# Patient Record
Sex: Female | Born: 1945
Health system: Southern US, Community
[De-identification: ages and names within clinical notes are randomized; demographics above are authoritative.]

## PROBLEM LIST (undated history)

## (undated) DIAGNOSIS — I10 Essential (primary) hypertension: Secondary | ICD-10-CM

## (undated) DIAGNOSIS — K219 Gastro-esophageal reflux disease without esophagitis: Secondary | ICD-10-CM

## (undated) DIAGNOSIS — D509 Iron deficiency anemia, unspecified: Secondary | ICD-10-CM

## (undated) DIAGNOSIS — I4891 Unspecified atrial fibrillation: Secondary | ICD-10-CM

## (undated) DIAGNOSIS — M1990 Unspecified osteoarthritis, unspecified site: Secondary | ICD-10-CM

## (undated) DIAGNOSIS — E559 Vitamin D deficiency, unspecified: Secondary | ICD-10-CM

## (undated) DIAGNOSIS — F329 Major depressive disorder, single episode, unspecified: Secondary | ICD-10-CM

## (undated) DIAGNOSIS — E119 Type 2 diabetes mellitus without complications: Secondary | ICD-10-CM

## (undated) DIAGNOSIS — I421 Obstructive hypertrophic cardiomyopathy: Secondary | ICD-10-CM

## (undated) DIAGNOSIS — K589 Irritable bowel syndrome without diarrhea: Secondary | ICD-10-CM

## (undated) DIAGNOSIS — F419 Anxiety disorder, unspecified: Secondary | ICD-10-CM

## (undated) DIAGNOSIS — I639 Cerebral infarction, unspecified: Secondary | ICD-10-CM

## (undated) DIAGNOSIS — I951 Orthostatic hypotension: Secondary | ICD-10-CM

## (undated) DIAGNOSIS — I82409 Acute embolism and thrombosis of unspecified deep veins of unspecified lower extremity: Secondary | ICD-10-CM

## (undated) DIAGNOSIS — M359 Systemic involvement of connective tissue, unspecified: Secondary | ICD-10-CM

## (undated) DIAGNOSIS — F32A Depression, unspecified: Secondary | ICD-10-CM

## (undated) DIAGNOSIS — F209 Schizophrenia, unspecified: Secondary | ICD-10-CM

## (undated) DIAGNOSIS — I509 Heart failure, unspecified: Secondary | ICD-10-CM

## (undated) DIAGNOSIS — Z8673 Personal history of transient ischemic attack (TIA), and cerebral infarction without residual deficits: Secondary | ICD-10-CM

## (undated) DIAGNOSIS — G8190 Hemiplegia, unspecified affecting unspecified side: Secondary | ICD-10-CM

## (undated) DIAGNOSIS — E785 Hyperlipidemia, unspecified: Secondary | ICD-10-CM

## (undated) DIAGNOSIS — Z993 Dependence on wheelchair: Secondary | ICD-10-CM

## (undated) DIAGNOSIS — I959 Hypotension, unspecified: Secondary | ICD-10-CM

## (undated) DIAGNOSIS — I4892 Unspecified atrial flutter: Secondary | ICD-10-CM

## (undated) DIAGNOSIS — U071 COVID-19: Secondary | ICD-10-CM

## (undated) HISTORY — DX: Unspecified atrial fibrillation: I48.91

## (undated) HISTORY — DX: Type 2 diabetes mellitus without complications: E11.9

## (undated) HISTORY — DX: COVID-19: U07.1

## (undated) HISTORY — PX: BACK SURGERY: SHX140

## (undated) HISTORY — DX: Essential (primary) hypertension: I10

## (undated) HISTORY — DX: Acute embolism and thrombosis of unspecified deep veins of unspecified lower extremity: I82.409

## (undated) HISTORY — DX: Personal history of transient ischemic attack (TIA), and cerebral infarction without residual deficits: Z86.73

## (undated) HISTORY — DX: Orthostatic hypotension: I95.1

## (undated) HISTORY — DX: Unspecified atrial flutter: I48.92

---

## 1997-05-18 ENCOUNTER — Other Ambulatory Visit: Admission: RE | Admit: 1997-05-18 | Discharge: 1997-05-18 | Payer: Self-pay | Admitting: Obstetrics and Gynecology

## 1997-06-19 ENCOUNTER — Ambulatory Visit (HOSPITAL_COMMUNITY): Admission: RE | Admit: 1997-06-19 | Discharge: 1997-06-19 | Payer: Self-pay | Admitting: Gastroenterology

## 1999-03-11 ENCOUNTER — Encounter: Admission: RE | Admit: 1999-03-11 | Discharge: 1999-03-11 | Payer: Self-pay | Admitting: Family Medicine

## 1999-03-11 ENCOUNTER — Encounter: Payer: Self-pay | Admitting: Family Medicine

## 1999-04-14 ENCOUNTER — Encounter: Payer: Self-pay | Admitting: Family Medicine

## 1999-04-14 ENCOUNTER — Encounter: Admission: RE | Admit: 1999-04-14 | Discharge: 1999-04-14 | Payer: Self-pay | Admitting: Family Medicine

## 1999-05-26 ENCOUNTER — Other Ambulatory Visit: Admission: RE | Admit: 1999-05-26 | Discharge: 1999-05-26 | Payer: Self-pay | Admitting: Obstetrics and Gynecology

## 1999-06-09 ENCOUNTER — Encounter: Payer: Self-pay | Admitting: Obstetrics and Gynecology

## 1999-06-09 ENCOUNTER — Ambulatory Visit (HOSPITAL_COMMUNITY): Admission: RE | Admit: 1999-06-09 | Discharge: 1999-06-09 | Payer: Self-pay | Admitting: Obstetrics and Gynecology

## 1999-06-20 ENCOUNTER — Other Ambulatory Visit: Admission: RE | Admit: 1999-06-20 | Discharge: 1999-06-20 | Payer: Self-pay | Admitting: Obstetrics and Gynecology

## 1999-06-20 ENCOUNTER — Encounter (INDEPENDENT_AMBULATORY_CARE_PROVIDER_SITE_OTHER): Payer: Self-pay | Admitting: Specialist

## 1999-07-12 ENCOUNTER — Encounter: Payer: Self-pay | Admitting: Obstetrics and Gynecology

## 1999-07-13 ENCOUNTER — Encounter (INDEPENDENT_AMBULATORY_CARE_PROVIDER_SITE_OTHER): Payer: Self-pay | Admitting: Specialist

## 1999-07-13 ENCOUNTER — Ambulatory Visit (HOSPITAL_COMMUNITY): Admission: RE | Admit: 1999-07-13 | Discharge: 1999-07-13 | Payer: Self-pay | Admitting: Obstetrics and Gynecology

## 2000-08-17 ENCOUNTER — Encounter: Payer: Self-pay | Admitting: Emergency Medicine

## 2000-08-17 ENCOUNTER — Emergency Department (HOSPITAL_COMMUNITY): Admission: EM | Admit: 2000-08-17 | Discharge: 2000-08-17 | Payer: Self-pay | Admitting: Emergency Medicine

## 2002-03-25 ENCOUNTER — Encounter: Payer: Self-pay | Admitting: Family Medicine

## 2002-03-25 ENCOUNTER — Encounter: Admission: RE | Admit: 2002-03-25 | Discharge: 2002-03-25 | Payer: Self-pay | Admitting: Family Medicine

## 2002-04-03 ENCOUNTER — Other Ambulatory Visit: Admission: RE | Admit: 2002-04-03 | Discharge: 2002-04-03 | Payer: Self-pay | Admitting: *Deleted

## 2002-04-03 ENCOUNTER — Encounter: Admission: RE | Admit: 2002-04-03 | Discharge: 2002-04-03 | Payer: Self-pay | Admitting: Obstetrics and Gynecology

## 2002-04-09 ENCOUNTER — Ambulatory Visit (HOSPITAL_COMMUNITY): Admission: RE | Admit: 2002-04-09 | Discharge: 2002-04-09 | Payer: Self-pay | Admitting: Obstetrics and Gynecology

## 2002-04-15 ENCOUNTER — Encounter: Payer: Self-pay | Admitting: Family Medicine

## 2002-04-15 ENCOUNTER — Encounter: Admission: RE | Admit: 2002-04-15 | Discharge: 2002-04-15 | Payer: Self-pay | Admitting: Family Medicine

## 2003-08-07 ENCOUNTER — Ambulatory Visit (HOSPITAL_COMMUNITY): Admission: RE | Admit: 2003-08-07 | Discharge: 2003-08-07 | Payer: Self-pay | Admitting: Orthopaedic Surgery

## 2003-09-01 ENCOUNTER — Encounter: Admission: RE | Admit: 2003-09-01 | Discharge: 2003-09-01 | Payer: Self-pay | Admitting: Orthopaedic Surgery

## 2003-09-16 ENCOUNTER — Encounter: Admission: RE | Admit: 2003-09-16 | Discharge: 2003-09-16 | Payer: Self-pay | Admitting: Orthopaedic Surgery

## 2003-09-30 ENCOUNTER — Encounter: Admission: RE | Admit: 2003-09-30 | Discharge: 2003-09-30 | Payer: Self-pay | Admitting: Orthopaedic Surgery

## 2003-11-05 ENCOUNTER — Encounter: Admission: RE | Admit: 2003-11-05 | Discharge: 2003-12-03 | Payer: Self-pay | Admitting: Neurosurgery

## 2003-11-12 ENCOUNTER — Emergency Department (HOSPITAL_COMMUNITY): Admission: EM | Admit: 2003-11-12 | Discharge: 2003-11-12 | Payer: Self-pay | Admitting: Emergency Medicine

## 2004-01-24 DIAGNOSIS — I82409 Acute embolism and thrombosis of unspecified deep veins of unspecified lower extremity: Secondary | ICD-10-CM

## 2004-01-24 HISTORY — DX: Acute embolism and thrombosis of unspecified deep veins of unspecified lower extremity: I82.409

## 2004-02-26 ENCOUNTER — Emergency Department (HOSPITAL_COMMUNITY): Admission: EM | Admit: 2004-02-26 | Discharge: 2004-02-26 | Payer: Self-pay | Admitting: Emergency Medicine

## 2004-03-04 ENCOUNTER — Ambulatory Visit (HOSPITAL_COMMUNITY): Admission: RE | Admit: 2004-03-04 | Discharge: 2004-03-04 | Payer: Self-pay | Admitting: Orthopaedic Surgery

## 2004-03-30 ENCOUNTER — Ambulatory Visit (HOSPITAL_COMMUNITY): Admission: RE | Admit: 2004-03-30 | Discharge: 2004-03-31 | Payer: Self-pay | Admitting: Orthopaedic Surgery

## 2004-04-03 ENCOUNTER — Inpatient Hospital Stay (HOSPITAL_COMMUNITY): Admission: AD | Admit: 2004-04-03 | Discharge: 2004-04-05 | Payer: Self-pay | Admitting: Orthopaedic Surgery

## 2004-05-13 ENCOUNTER — Encounter: Admission: RE | Admit: 2004-05-13 | Discharge: 2004-08-11 | Payer: Self-pay | Admitting: Orthopaedic Surgery

## 2004-05-30 ENCOUNTER — Ambulatory Visit (HOSPITAL_COMMUNITY): Admission: RE | Admit: 2004-05-30 | Discharge: 2004-05-30 | Payer: Self-pay | Admitting: Orthopaedic Surgery

## 2004-06-05 ENCOUNTER — Emergency Department (HOSPITAL_COMMUNITY): Admission: EM | Admit: 2004-06-05 | Discharge: 2004-06-06 | Payer: Self-pay | Admitting: Emergency Medicine

## 2004-06-16 ENCOUNTER — Emergency Department (HOSPITAL_COMMUNITY): Admission: EM | Admit: 2004-06-16 | Discharge: 2004-06-16 | Payer: Self-pay | Admitting: Emergency Medicine

## 2004-06-17 ENCOUNTER — Observation Stay (HOSPITAL_COMMUNITY): Admission: EM | Admit: 2004-06-17 | Discharge: 2004-06-18 | Payer: Self-pay | Admitting: Emergency Medicine

## 2004-06-18 ENCOUNTER — Inpatient Hospital Stay (HOSPITAL_COMMUNITY): Admission: AD | Admit: 2004-06-18 | Discharge: 2004-06-19 | Payer: Self-pay | Admitting: Psychiatry

## 2004-06-18 ENCOUNTER — Ambulatory Visit: Payer: Self-pay | Admitting: Psychiatry

## 2004-06-19 ENCOUNTER — Inpatient Hospital Stay (HOSPITAL_COMMUNITY): Admission: EM | Admit: 2004-06-19 | Discharge: 2004-06-22 | Payer: Self-pay | Admitting: Emergency Medicine

## 2004-06-22 ENCOUNTER — Inpatient Hospital Stay (HOSPITAL_COMMUNITY): Admission: RE | Admit: 2004-06-22 | Discharge: 2004-07-08 | Payer: Self-pay | Admitting: Psychiatry

## 2004-07-14 ENCOUNTER — Inpatient Hospital Stay (HOSPITAL_COMMUNITY): Admission: EM | Admit: 2004-07-14 | Discharge: 2004-07-18 | Payer: Self-pay | Admitting: Emergency Medicine

## 2004-07-22 ENCOUNTER — Emergency Department (HOSPITAL_COMMUNITY): Admission: EM | Admit: 2004-07-22 | Discharge: 2004-07-22 | Payer: Self-pay | Admitting: Emergency Medicine

## 2004-08-10 ENCOUNTER — Emergency Department (HOSPITAL_COMMUNITY): Admission: EM | Admit: 2004-08-10 | Discharge: 2004-08-10 | Payer: Self-pay | Admitting: Emergency Medicine

## 2004-08-11 ENCOUNTER — Emergency Department (HOSPITAL_COMMUNITY): Admission: EM | Admit: 2004-08-11 | Discharge: 2004-08-11 | Payer: Self-pay | Admitting: Emergency Medicine

## 2004-08-18 ENCOUNTER — Emergency Department (HOSPITAL_COMMUNITY): Admission: EM | Admit: 2004-08-18 | Discharge: 2004-08-18 | Payer: Self-pay | Admitting: Emergency Medicine

## 2004-09-17 ENCOUNTER — Emergency Department (HOSPITAL_COMMUNITY): Admission: EM | Admit: 2004-09-17 | Discharge: 2004-09-17 | Payer: Self-pay | Admitting: Emergency Medicine

## 2004-12-26 ENCOUNTER — Emergency Department (HOSPITAL_COMMUNITY): Admission: EM | Admit: 2004-12-26 | Discharge: 2004-12-27 | Payer: Self-pay | Admitting: Emergency Medicine

## 2005-07-20 ENCOUNTER — Ambulatory Visit (HOSPITAL_COMMUNITY): Admission: RE | Admit: 2005-07-20 | Discharge: 2005-07-20 | Payer: Self-pay | Admitting: Internal Medicine

## 2006-05-08 ENCOUNTER — Ambulatory Visit (HOSPITAL_COMMUNITY): Admission: RE | Admit: 2006-05-08 | Discharge: 2006-05-08 | Payer: Self-pay | Admitting: Otolaryngology

## 2007-02-01 ENCOUNTER — Inpatient Hospital Stay (HOSPITAL_COMMUNITY): Admission: RE | Admit: 2007-02-01 | Discharge: 2007-02-06 | Payer: Self-pay | Admitting: Internal Medicine

## 2007-04-12 ENCOUNTER — Emergency Department (HOSPITAL_COMMUNITY): Admission: EM | Admit: 2007-04-12 | Discharge: 2007-04-12 | Payer: Self-pay | Admitting: Emergency Medicine

## 2008-01-24 DIAGNOSIS — G8190 Hemiplegia, unspecified affecting unspecified side: Secondary | ICD-10-CM

## 2008-01-24 HISTORY — DX: Hemiplegia, unspecified affecting unspecified side: G81.90

## 2008-04-29 ENCOUNTER — Emergency Department (HOSPITAL_COMMUNITY): Admission: EM | Admit: 2008-04-29 | Discharge: 2008-04-29 | Payer: Self-pay | Admitting: Emergency Medicine

## 2008-05-20 ENCOUNTER — Encounter (HOSPITAL_COMMUNITY): Admission: RE | Admit: 2008-05-20 | Discharge: 2008-06-19 | Payer: Self-pay | Admitting: Sports Medicine

## 2008-07-23 ENCOUNTER — Emergency Department (HOSPITAL_COMMUNITY): Admission: EM | Admit: 2008-07-23 | Discharge: 2008-07-23 | Payer: Self-pay | Admitting: Emergency Medicine

## 2008-07-24 ENCOUNTER — Ambulatory Visit (HOSPITAL_COMMUNITY): Admission: RE | Admit: 2008-07-24 | Discharge: 2008-07-24 | Payer: Self-pay | Admitting: Internal Medicine

## 2008-09-03 ENCOUNTER — Ambulatory Visit: Payer: Self-pay | Admitting: Cardiology

## 2008-09-03 ENCOUNTER — Inpatient Hospital Stay (HOSPITAL_COMMUNITY): Admission: EM | Admit: 2008-09-03 | Discharge: 2008-09-07 | Payer: Self-pay | Admitting: Emergency Medicine

## 2008-12-30 ENCOUNTER — Emergency Department (HOSPITAL_COMMUNITY): Admission: EM | Admit: 2008-12-30 | Discharge: 2008-12-30 | Payer: Self-pay | Admitting: Emergency Medicine

## 2009-01-01 ENCOUNTER — Ambulatory Visit: Payer: Self-pay | Admitting: Cardiology

## 2009-01-01 ENCOUNTER — Inpatient Hospital Stay (HOSPITAL_COMMUNITY): Admission: EM | Admit: 2009-01-01 | Discharge: 2009-01-08 | Payer: Self-pay | Admitting: Emergency Medicine

## 2009-01-04 ENCOUNTER — Encounter: Payer: Self-pay | Admitting: Neurology

## 2009-01-23 ENCOUNTER — Ambulatory Visit: Payer: Self-pay

## 2009-02-13 ENCOUNTER — Ambulatory Visit: Payer: Self-pay

## 2010-02-13 ENCOUNTER — Encounter: Payer: Self-pay | Admitting: Internal Medicine

## 2010-02-23 ENCOUNTER — Encounter (INDEPENDENT_AMBULATORY_CARE_PROVIDER_SITE_OTHER): Payer: Medicare Other

## 2010-02-23 ENCOUNTER — Encounter: Payer: Self-pay | Admitting: Cardiology

## 2010-02-23 ENCOUNTER — Emergency Department (HOSPITAL_COMMUNITY)
Admission: EM | Admit: 2010-02-23 | Discharge: 2010-02-23 | Disposition: A | Discharge status: Home / Self Care | Payer: Medicare Other | Attending: Emergency Medicine | Admitting: Emergency Medicine

## 2010-02-23 ENCOUNTER — Other Ambulatory Visit (HOSPITAL_COMMUNITY): Payer: Self-pay | Admitting: *Deleted

## 2010-02-23 ENCOUNTER — Ambulatory Visit: Admission: EM | Admit: 2010-02-23 | Payer: Self-pay | Source: Home / Self Care | Admitting: Cardiology

## 2010-02-23 ENCOUNTER — Emergency Department (HOSPITAL_COMMUNITY): Admit: 2010-02-23 | Discharge: 2010-02-23 | Disposition: A | Discharge status: Home / Self Care | Payer: Medicare Other

## 2010-02-23 DIAGNOSIS — R112 Nausea with vomiting, unspecified: Secondary | ICD-10-CM | POA: Insufficient documentation

## 2010-02-23 DIAGNOSIS — F329 Major depressive disorder, single episode, unspecified: Secondary | ICD-10-CM | POA: Insufficient documentation

## 2010-02-23 DIAGNOSIS — I10 Essential (primary) hypertension: Secondary | ICD-10-CM | POA: Insufficient documentation

## 2010-02-23 DIAGNOSIS — Z7901 Long term (current) use of anticoagulants: Secondary | ICD-10-CM

## 2010-02-23 DIAGNOSIS — R109 Unspecified abdominal pain: Secondary | ICD-10-CM | POA: Insufficient documentation

## 2010-02-23 DIAGNOSIS — E119 Type 2 diabetes mellitus without complications: Secondary | ICD-10-CM | POA: Insufficient documentation

## 2010-02-23 DIAGNOSIS — K8020 Calculus of gallbladder without cholecystitis without obstruction: Secondary | ICD-10-CM | POA: Insufficient documentation

## 2010-02-23 DIAGNOSIS — R1011 Right upper quadrant pain: Secondary | ICD-10-CM

## 2010-02-23 DIAGNOSIS — R1110 Vomiting, unspecified: Secondary | ICD-10-CM

## 2010-02-23 DIAGNOSIS — Z79899 Other long term (current) drug therapy: Secondary | ICD-10-CM | POA: Insufficient documentation

## 2010-02-23 DIAGNOSIS — F3289 Other specified depressive episodes: Secondary | ICD-10-CM | POA: Insufficient documentation

## 2010-02-23 DIAGNOSIS — I6789 Other cerebrovascular disease: Secondary | ICD-10-CM

## 2010-02-23 DIAGNOSIS — K219 Gastro-esophageal reflux disease without esophagitis: Secondary | ICD-10-CM | POA: Insufficient documentation

## 2010-02-23 DIAGNOSIS — Z8673 Personal history of transient ischemic attack (TIA), and cerebral infarction without residual deficits: Secondary | ICD-10-CM | POA: Insufficient documentation

## 2010-02-23 DIAGNOSIS — R10819 Abdominal tenderness, unspecified site: Secondary | ICD-10-CM | POA: Insufficient documentation

## 2010-02-23 DIAGNOSIS — I80299 Phlebitis and thrombophlebitis of other deep vessels of unspecified lower extremity: Secondary | ICD-10-CM

## 2010-02-23 DIAGNOSIS — Z86718 Personal history of other venous thrombosis and embolism: Secondary | ICD-10-CM | POA: Insufficient documentation

## 2010-02-23 DIAGNOSIS — R110 Nausea: Secondary | ICD-10-CM

## 2010-02-23 LAB — DIFFERENTIAL
Basophils Absolute: 0 10*3/uL (ref 0.0–0.1)
Basophils Relative: 0 % (ref 0–1)
Eosinophils Absolute: 0.1 10*3/uL (ref 0.0–0.7)
Eosinophils Relative: 1 % (ref 0–5)
Lymphocytes Relative: 19 % (ref 12–46)
Lymphs Abs: 1.8 10*3/uL (ref 0.7–4.0)
Monocytes Absolute: 0.5 10*3/uL (ref 0.1–1.0)
Monocytes Relative: 5 % (ref 3–12)
Neutro Abs: 7.4 10*3/uL (ref 1.7–7.7)
Neutrophils Relative %: 75 % (ref 43–77)

## 2010-02-23 LAB — HEPATIC FUNCTION PANEL
ALT: 13 U/L (ref 0–35)
AST: 18 U/L (ref 0–37)
Albumin: 4 g/dL (ref 3.5–5.2)
Alkaline Phosphatase: 65 U/L (ref 39–117)
Bilirubin, Direct: <0.1 mg/dL (ref 0.0–0.3)
Total Bilirubin: 0.3 mg/dL (ref 0.3–1.2)
Total Protein: 7.6 g/dL (ref 6.0–8.3)

## 2010-02-23 LAB — CONVERTED CEMR LAB: POC INR: 1.9

## 2010-02-23 LAB — URINALYSIS, ROUTINE W REFLEX MICROSCOPIC
Bilirubin Urine: NEGATIVE
Hgb urine dipstick: NEGATIVE
Nitrite: NEGATIVE
Protein, ur: NEGATIVE mg/dL
Specific Gravity, Urine: >1.03 — ABNORMAL HIGH (ref 1.005–1.030)
Urine Glucose, Fasting: NEGATIVE mg/dL
Urobilinogen, UA: 0.2 mg/dL (ref 0.0–1.0)
pH: 6 (ref 5.0–8.0)

## 2010-02-23 LAB — BASIC METABOLIC PANEL
BUN: 14 mg/dL (ref 6–23)
CO2: 28 mEq/L (ref 19–32)
Calcium: 9.3 mg/dL (ref 8.4–10.5)
Chloride: 98 mEq/L (ref 96–112)
Creatinine, Ser: 0.84 mg/dL (ref 0.4–1.2)
GFR calc Af Amer: >60 mL/min (ref >60)
GFR calc non Af Amer: >60 mL/min (ref >60)
Glucose, Bld: 195 mg/dL — ABNORMAL HIGH (ref 70–99)
Potassium: 4.3 mEq/L (ref 3.5–5.1)
Sodium: 137 mEq/L (ref 135–145)

## 2010-02-23 LAB — URINE MICROSCOPIC-ADD ON

## 2010-02-23 LAB — CBC
HCT: 37 % (ref 36.0–46.0)
Hemoglobin: 12.4 g/dL (ref 12.0–15.0)
MCH: 28.8 pg (ref 26.0–34.0)
MCHC: 33.5 g/dL (ref 30.0–36.0)
MCV: 86 fL (ref 78.0–100.0)
Platelets: 311 10*3/uL (ref 150–400)
RBC: 4.3 MIL/uL (ref 3.87–5.11)
RDW: 13.6 % (ref 11.5–15.5)
WBC: 9.9 10*3/uL (ref 4.0–10.5)

## 2010-02-23 LAB — LIPASE, BLOOD: Lipase: 21 U/L (ref 11–59)

## 2010-02-23 MED ORDER — IOHEXOL 300 MG/ML  SOLN
100.0000 mL | Freq: Once | INTRAMUSCULAR | Status: AC | PRN
  Administered 2010-02-23: 100 mL via INTRAVENOUS

## 2010-02-24 ENCOUNTER — Ambulatory Visit (HOSPITAL_COMMUNITY)
Admission: RE | Admit: 2010-02-24 | Discharge: 2010-02-24 | Disposition: A | Discharge status: Home / Self Care | Payer: Medicare Other | Source: Ambulatory Visit | Attending: "Endocrinology | Admitting: "Endocrinology

## 2010-02-24 DIAGNOSIS — R1110 Vomiting, unspecified: Secondary | ICD-10-CM

## 2010-02-24 DIAGNOSIS — K8020 Calculus of gallbladder without cholecystitis without obstruction: Secondary | ICD-10-CM | POA: Insufficient documentation

## 2010-02-24 DIAGNOSIS — R109 Unspecified abdominal pain: Secondary | ICD-10-CM | POA: Insufficient documentation

## 2010-02-24 DIAGNOSIS — R110 Nausea: Secondary | ICD-10-CM

## 2010-02-24 DIAGNOSIS — R1011 Right upper quadrant pain: Secondary | ICD-10-CM

## 2010-02-24 DIAGNOSIS — R112 Nausea with vomiting, unspecified: Secondary | ICD-10-CM | POA: Insufficient documentation

## 2010-03-02 NOTE — Letter (Signed)
Summary: DR [name] CCR REFERAL  DR [name] CCR REFERAL   Imported By: Faythe Ghee 02/23/2010 11:07:11  _____________________________________________________________________  External Attachment:    Type:   Image     Comment:   External Document

## 2010-03-02 NOTE — Medication Information (Signed)
Summary: ***ccr-new pt per Dr [name]-lr/rec on file./tmj  Anticoagulant Therapy  Managed by: Vashti Hey, RN PCP: Bari Edward MD: Diona Browner MD, Remi Deter Indication 1: Cerebrovascular Accident Indication 2: DVT Lab Used: [facility] [place] Site: [place] INR POC 1.9  Dietary changes: no    Health status changes: no    Bleeding/hemorrhagic complications: no    Recent/future hospitalizations: no    Any changes in medication regimen? no    Recent/future dental: no  Any missed doses?: no       Is patient compliant with meds? yes      Comments: Pt sent to [place] for coumadin management by Dr [name]  Allergies (verified): 1)  ! Sulfa  Anticoagulation Management History:      The patient is taking warfarin and comes in today for a routine follow up visit.  Negative risk factors for bleeding include an age less than 38 years old.  The bleeding index is 'low risk'.  Negative CHADS2 values include Age > 8 years old.  Anticoagulation responsible provider: Diona Browner MD, Remi Deter.  INR POC: 1.9.  Cuvette Lot#: [id number].    Anticoagulation Management Assessment/Plan:      The patient's current anticoagulation dose is Warfarin sodium 4 mg tabs: Take as directed by coumadin clinic.  The target INR is 2.0-3.0.  The next INR is due 03/23/2010.  Anticoagulation instructions were given to patient.  Results were reviewed/authorized by Vashti Hey, RN.  She was notified by Vashti Hey RN.        Coagulation management information includes: 02/23/10  New referal from Dr Rondel Baton office.  Has been stable on coumadin 4mg  once daily .  Current Anticoagulation Instructions: INR 1.9 Take coumadin 6mg  tonight then resume 4mg  once daily

## 2010-03-23 ENCOUNTER — Encounter (INDEPENDENT_AMBULATORY_CARE_PROVIDER_SITE_OTHER): Payer: Medicare Other

## 2010-03-23 ENCOUNTER — Encounter: Payer: Self-pay | Admitting: Cardiology

## 2010-03-23 DIAGNOSIS — I80299 Phlebitis and thrombophlebitis of other deep vessels of unspecified lower extremity: Secondary | ICD-10-CM

## 2010-03-23 DIAGNOSIS — I6789 Other cerebrovascular disease: Secondary | ICD-10-CM

## 2010-03-23 DIAGNOSIS — Z7901 Long term (current) use of anticoagulants: Secondary | ICD-10-CM

## 2010-03-23 LAB — CONVERTED CEMR LAB: POC INR: 1.9

## 2010-03-24 ENCOUNTER — Ambulatory Visit (INDEPENDENT_AMBULATORY_CARE_PROVIDER_SITE_OTHER): Payer: Medicare Other | Admitting: Cardiology

## 2010-03-24 ENCOUNTER — Ambulatory Visit: Payer: Self-pay | Admitting: Cardiology

## 2010-03-24 ENCOUNTER — Encounter: Payer: Self-pay | Admitting: Cardiology

## 2010-03-24 DIAGNOSIS — E119 Type 2 diabetes mellitus without complications: Secondary | ICD-10-CM

## 2010-03-24 DIAGNOSIS — I80299 Phlebitis and thrombophlebitis of other deep vessels of unspecified lower extremity: Secondary | ICD-10-CM

## 2010-03-24 DIAGNOSIS — I10 Essential (primary) hypertension: Secondary | ICD-10-CM

## 2010-03-24 DIAGNOSIS — Z8679 Personal history of other diseases of the circulatory system: Secondary | ICD-10-CM | POA: Insufficient documentation

## 2010-03-24 DIAGNOSIS — E1165 Type 2 diabetes mellitus with hyperglycemia: Secondary | ICD-10-CM | POA: Insufficient documentation

## 2010-03-24 DIAGNOSIS — I82409 Acute embolism and thrombosis of unspecified deep veins of unspecified lower extremity: Secondary | ICD-10-CM | POA: Insufficient documentation

## 2010-03-24 DIAGNOSIS — E1159 Type 2 diabetes mellitus with other circulatory complications: Secondary | ICD-10-CM | POA: Insufficient documentation

## 2010-03-25 ENCOUNTER — Encounter: Payer: Self-pay | Admitting: Cardiology

## 2010-03-31 NOTE — Assessment & Plan Note (Signed)
Summary: ***new to [facility]/rm/ dr fanta/ called for red 03/18/10/tmj/ REC O...   Visit Type:  New patient visit Primary Provider:  Dr. Avon Gully   History of Present Illness: 65 year old woman presents as a new patient, given enrollment in our Coumadin clinic. She has a history of recurrent lower extremity deep venous thrombosis, also stroke, history reviewed below. She has been on Coumadin for the last several years.  She reports no significant bleeding problems, no falls. She uses a walker to steady herself. No recent complaints of palpitations or chest pain. No unusual lower extremity edema. She denies any major weakness or residual effects of her stroke in 2010.  Not clear if she ever underwent any prior formal thrombophilia workup, although it seems to Dr. [name] plans to continue Coumadin chronically.  Preventive Screening-Counseling & Management  Alcohol-Tobacco     Smoking Status: quit  Current Medications (verified): 1)  Warfarin Sodium 4 Mg Tabs (Warfarin Sodium) .... Take As Directed By Coumadin Clinic 2)  Warfarin Sodium 6 Mg Tabs (Warfarin Sodium) .... As Directed By Coumadin Clinic 3)  Ambien 10 Mg Tabs (Zolpidem Tartrate) .... Take As Needed 4)  Metoprolol Tartrate 25 Mg Tabs (Metoprolol Tartrate) .... Take 1 Tab Two Times A Day 5)  Omeprazole 20 Mg Cpdr (Omeprazole) .Marland Kitchen.. 1 Tablet By Mouth Once Daily 6)  Remeron 30 Mg Tabs (Mirtazapine) .... Take 1 Tab Daily 7)  Glipizide 10 Mg Tabs (Glipizide) .... Take 1 Tab Two Times A Day 8)  Metoclopramide Hcl 10 Mg Tabs (Metoclopramide Hcl) .Marland Kitchen.. 1 Tablet Three Times A Day Before Meals 9)  Klonopin 0.5 Mg Tabs (Clonazepam) .... Take 1 Tab Three Times A Day 10)  Cymbalta 60 Mg Cpep (Duloxetine Hcl) .... Take 2  Tabs Two Times A Day 11)  Lortab 5-500 Mg Tabs (Hydrocodone-Acetaminophen) .... Take As Needed 12)  Soma 350 Mg Tabs (Carisoprodol) .... Take As Needed 13)  Vesicare 5 Mg Tabs (Solifenacin Succinate) .Marland Kitchen.. 1 Tablet By Mouth Once  Daily 14)  Enalapril Maleate 5 Mg Tabs (Enalapril Maleate) .Marland Kitchen.. 1 Tablet in Am 15)  Lantus 100 Unit/ml Soln (Insulin Glargine) .... 20 Units Subcutaneously Two Times A Day 16)  Abilify 2 Mg Tabs (Aripiprazole) .Marland Kitchen.. 1 Tablet By Mouth in Am 17)  Mirtazapine 30 Mg Tabs (Mirtazapine) .Marland Kitchen.. 1 Tab At Bedtime 18)  Liquid Antacid Extra Strength 500-450-40 Mg/86ml Susp (Alum & Mag Hydroxide-Simeth) .... As Needed 19)  Aimsco Lubricated  Misc (Condoms Latex Lubricated) .... As Directed 20)  Loperamide Hcl 2 Mg Tabs (Loperamide Hcl) .... 2 Caps After Each Loose Stool 21)  Zofran Odt 8 Mg Tbdp (Ondansetron) .... Dissolve 1 Tablet in Mouth Every 8 Hrs. As Needed Nausea 22)  Polyethylene Glycol 3350  Liqd (Polyethylene Glycol 3350) .Marland Kitchen.. 1 Capful  8 Oz. of Water and Drink Once Daily  Allergies (verified): 1)  ! Sulfa  Past History:  Family History: Last updated: 03/24/2010 No reported premature cardiovascular disease  Social History: Last updated: 03/24/2010 Disabled  Resides at [facility] nursing home Tobacco Use - Former.  Alcohol Use - no (former)  Past Medical History: Cerebrovascular Disease - acute infarct and right cerebral white matter, small vessel 12/10 Anxiety Diabetes Type 2 Depression with history of psychosis and previous suicide attempt History of recurrent DVTs, on Coumadin Hypertension Lumbar disc disease [facility] Cholelithiasis  Past Surgical History: Hysteroscopy with dilatation and curettage Right L5-S1 microdiscectomy  Family History: No reported premature cardiovascular disease  Social History: Disabled  Resides at [facility] nursing home Tobacco Use - Former.  Alcohol Use - no (former) Smoking Status:  quit  Review of Systems  The patient denies anorexia, fever, weight gain, chest pain, syncope, dyspnea on exertion, peripheral edema, hemoptysis, abdominal pain, melena, and hematochezia.         States that breakfast is her best meal. Otherwise fair  appetite. Otherwise reviewed and negative except as outlined.  Vital Signs:  Patient profile:   65 year old female Height:      63 inches Weight:      171 pounds BMI:     30.40 O2 Sat:      95 % on Room air Pulse rate:   90 / minute BP sitting:   141 / 84  (left arm)  O2 Flow:  Room air  Physical Exam  Additional Exam:  Overweight, chronically ill-appearing woman, no acute distress. HEENT: Conjunctiva and lids normal, oropharynx with poor dentition. Neck: Supple, no elevated JVP or significant bruits. Lungs: Diminished breath sounds, nonlabored. Cardiac: Regular rate and rhythm, S4, soft systolic murmur, no pericardial rub. Abdomen: Soft, nontender, bowel sounds present. Skin: Warm and dry. Musculoskeletal: No kyphosis. Skin: Warm and dry. Neuropsychiatric: Alert oriented x3, affect appropriate.   Echocardiogram  Procedure date:  01/04/2009  Findings:       Study Conclusions    - Left ventricle: The cavity size was normal. Wall thickness was     increased in a pattern of moderate LVH. Systolic function was     normal. The estimated ejection fraction was in the range of 60% to     65%. Wall motion was normal; there were no regional wall motion     abnormalities.   - Aortic valve: Moderately calcified annulus. Trileaflet; mildly     calcified leaflets.   - Mitral valve: Mildly to moderately calcified annulus.   - Right ventricle: The cavity size was normal. Wall thickness was     mildly increased.   - Pericardium, extracardiac: A trivial pericardial effusion was     identified.  Carotid Doppler  Procedure date:  01/04/2009  Findings:      IMPRESSION:    1.  No evidence of hemodynamically significant stenosis involving   either the right or left carotid circulation in the neck by Doppler   criteria.   2.  Antegrade flow in both vertebral arteries.   3.  Minimal calcified and noncalcified plaque in the carotid bulbs   and proximal internal and external carotid  arteries bilaterally.  Impression & Recommendations:  Problem # 1:  DVT (ICD-453.40)  History of recurrent lower extremity deep venous thrombosis. Patient on Coumadin chronically under the direction of Dr. [name]. She is now enrolled in our Coumadin clinic for followup PT/INR.  Problem # 2:  CEREBROVASCULAR DISEASE, HX OF (ICD-V12.50)  Previous history of stroke back in 2010, outlined above.  Problem # 3:  ESSENTIAL HYPERTENSION, BENIGN (ICD-401.1)  Blood pressure mildly elevated today. This is followed by Dr. [name].  The following medications were removed from the medication list:    Aspir-low 81 Mg Tbec (Aspirin) .Marland Kitchen... Take 1 tab daily    Aspir-low 81 Mg Tbec (Aspirin) .Marland Kitchen... Take 1 tab dialy Her updated medication list for this problem includes:    Metoprolol Tartrate 25 Mg Tabs (Metoprolol tartrate) .Marland Kitchen... Take 1 tab two times a day    Enalapril Maleate 5 Mg Tabs (Enalapril maleate) .Marland Kitchen... 1 tablet in am  Patient Instructions: 1)  Your physician recommends  that you schedule a follow-up appointment in: 1 year 2)  Your physician recommends that you continue on your current medications as directed. Please refer to the Current Medication list given to you today.  Prevention & Chronic Care Immunizations   Influenza vaccine: Not documented    Tetanus booster: Not documented    Pneumococcal vaccine: Not documented    H. zoster vaccine: Not documented  Colorectal Screening   Hemoccult: Not documented    Colonoscopy: Not documented  Other Screening   Pap smear: Not documented    Mammogram: Not documented    DXA bone density scan: Not documented   Smoking status: quit  (03/24/2010)  Diabetes Mellitus   HgbA1C: Not documented    Eye exam: Not documented    Foot exam: Not documented   High risk foot: Not documented   Foot care education: Not documented    Urine microalbumin/creatinine ratio: Not documented  Lipids   Total Cholesterol: Not documented   LDL: Not  documented   LDL Direct: Not documented   HDL: Not documented   Triglycerides: Not documented  Hypertension   Last Blood Pressure: 141 / 84  (03/24/2010)   Serum creatinine: Not documented   Serum potassium Not documented  Self-Management Support :    Diabetes self-management support: Not documented    Hypertension self-management support: Not documented

## 2010-03-31 NOTE — Letter (Signed)
Summary: External Correspondence  External Correspondence   Imported By: Faythe Ghee 03/25/2010 10:14:49  _____________________________________________________________________  External Attachment:    Type:   Image     Comment:   External Document

## 2010-03-31 NOTE — Medication Information (Signed)
Summary: CCR-LR  Anticoagulant Therapy  Managed by: Vashti Hey, RN PCP: Bari Edward MD: Dietrich Pates MD, Molly Maduro Indication 1: Cerebrovascular Accident Indication 2: DVT Lab Used: [facility] [place] Site: [place] INR POC 1.9  Dietary changes: no    Health status changes: no    Bleeding/hemorrhagic complications: no    Recent/future hospitalizations: no    Any changes in medication regimen? no    Recent/future dental: no  Any missed doses?: no       Is patient compliant with meds? yes       Allergies: 1)  ! Sulfa  Anticoagulation Management History:      The patient is taking warfarin and comes in today for a routine follow up visit.  Negative risk factors for bleeding include an age less than 70 years old.  The bleeding index is 'low risk'.  Negative CHADS2 values include Age > 72 years old.  Anticoagulation responsible provider: Dietrich Pates MD, Molly Maduro.  INR POC: 1.9.  Cuvette Lot#: [id number].    Anticoagulation Management Assessment/Plan:      The patient's current anticoagulation dose is Warfarin sodium 4 mg tabs: Take as directed by coumadin clinic, Warfarin sodium 2 mg tabs: take 1 tablet daily along with a 4mg  tablet or as directed by anticoagultion clinic.  The target INR is 2.0-3.0.  The next INR is due 04/13/2010.  Anticoagulation instructions were given to patient.  Results were reviewed/authorized by Vashti Hey, RN.         Prior Anticoagulation Instructions: INR 1.9 Take coumadin 6mg  tonight then resume 4mg  once daily   Current Anticoagulation Instructions: INR 1.9 Increase coumadin to 4mg  once daily except 6mg  on Wednesdays

## 2010-04-06 ENCOUNTER — Encounter (INDEPENDENT_AMBULATORY_CARE_PROVIDER_SITE_OTHER): Payer: Self-pay | Admitting: *Deleted

## 2010-04-09 ENCOUNTER — Encounter: Payer: Self-pay | Admitting: Cardiology

## 2010-04-09 DIAGNOSIS — I82409 Acute embolism and thrombosis of unspecified deep veins of unspecified lower extremity: Secondary | ICD-10-CM

## 2010-04-09 DIAGNOSIS — Z7901 Long term (current) use of anticoagulants: Secondary | ICD-10-CM | POA: Insufficient documentation

## 2010-04-09 DIAGNOSIS — Z8679 Personal history of other diseases of the circulatory system: Secondary | ICD-10-CM

## 2010-04-12 NOTE — Miscellaneous (Signed)
  Clinical Lists Changes  Medications: Changed medication from WARFARIN SODIUM 4 MG TABS (WARFARIN SODIUM) Take as directed by coumadin clinic to WARFARIN SODIUM 4 MG TABS (WARFARIN SODIUM) 4mg  once daily except 6mg  on Wednesdays or as directed by anticoagultion clinic - Signed Changed medication from WARFARIN SODIUM 6 MG TABS (WARFARIN SODIUM) as directed by coumadin clinic to WARFARIN SODIUM 6 MG TABS (WARFARIN SODIUM) 4mg  once daily except 6mg  on Wednesdays or as directed by anticoagulaiton clinic - Signed Rx of WARFARIN SODIUM 4 MG TABS (WARFARIN SODIUM) 4mg  once daily except 6mg  on Wednesdays or as directed by anticoagultion clinic;  #30 x 1;  Signed;  Entered by: Teressa Lower RN;  Authorized by: Loreli Slot, MD, [facility];  Method used: Electronically to [facility] [address], [place], [place], [place]  [address], Ph: [phone], Fax: [phone] Rx of WARFARIN SODIUM 6 MG TABS (WARFARIN SODIUM) 4mg  once daily except 6mg  on Wednesdays or as directed by anticoagulaiton clinic;  #30 x 1;  Signed;  Entered by: Teressa Lower RN;  Authorized by: Loreli Slot, MD, [facility];  Method used: Electronically to [facility] [address], [place], [place], [place]  [address], Ph: [phone], Fax: [phone]    Prescriptions: WARFARIN SODIUM 6 MG TABS (WARFARIN SODIUM) 4mg  once daily except 6mg  on Wednesdays or as directed by anticoagulaiton clinic  #30 x 1   Entered by:   Teressa Lower RN   Authorized by:   Loreli Slot, MD, [facility]   Signed by:   Teressa Lower RN on 04/06/2010   Method used:   Electronically to        [facility] (retail)       [address]       [place], [place]  [address]       Ph: [phone]       Fax: [phone]   RxID:   [id number] WARFARIN SODIUM 4 MG TABS (WARFARIN SODIUM) 4mg  once daily except 6mg  on Wednesdays or as directed by anticoagultion clinic  #30 x 1  Entered by:   Teressa Lower RN   Authorized by:   Loreli Slot, MD, [facility]   Signed by:   Teressa Lower RN on 04/06/2010   Method used:   Electronically to        [facility] (retail)       [address]       [place], [place]  [address]       Ph: [phone]       Fax: [phone]   RxID:   [id number]

## 2010-04-13 ENCOUNTER — Ambulatory Visit (INDEPENDENT_AMBULATORY_CARE_PROVIDER_SITE_OTHER): Payer: Medicare Other | Admitting: *Deleted

## 2010-04-13 DIAGNOSIS — Z7901 Long term (current) use of anticoagulants: Secondary | ICD-10-CM

## 2010-04-13 DIAGNOSIS — Z8679 Personal history of other diseases of the circulatory system: Secondary | ICD-10-CM

## 2010-04-13 DIAGNOSIS — I82409 Acute embolism and thrombosis of unspecified deep veins of unspecified lower extremity: Secondary | ICD-10-CM

## 2010-04-13 LAB — POCT INR: INR: 1.7

## 2010-04-25 LAB — GLUCOSE, CAPILLARY
Glucose-Capillary: 123 mg/dL — ABNORMAL HIGH (ref 70–99)
Glucose-Capillary: 124 mg/dL — ABNORMAL HIGH (ref 70–99)
Glucose-Capillary: 137 mg/dL — ABNORMAL HIGH (ref 70–99)
Glucose-Capillary: 151 mg/dL — ABNORMAL HIGH (ref 70–99)
Glucose-Capillary: 68 mg/dL — ABNORMAL LOW (ref 70–99)
Glucose-Capillary: 98 mg/dL (ref 70–99)
Glucose-Capillary: 98 mg/dL (ref 70–99)
Glucose-Capillary: 99 mg/dL (ref 70–99)

## 2010-04-25 LAB — PROTIME-INR
INR: 2.61 — ABNORMAL HIGH (ref 0.00–1.49)
INR: 2.81 — ABNORMAL HIGH (ref 0.00–1.49)
Prothrombin Time: 27.7 seconds — ABNORMAL HIGH (ref 11.6–15.2)
Prothrombin Time: 29.4 seconds — ABNORMAL HIGH (ref 11.6–15.2)

## 2010-04-26 LAB — CBC
HCT: 38.7 % (ref 36.0–46.0)
Hemoglobin: 13 g/dL (ref 12.0–15.0)
MCHC: 33.6 g/dL (ref 30.0–36.0)
MCV: 86.5 fL (ref 78.0–100.0)
Platelets: 310 10*3/uL (ref 150–400)
RBC: 4.47 MIL/uL (ref 3.87–5.11)
RDW: 14.2 % (ref 11.5–15.5)
WBC: 10.9 10*3/uL — ABNORMAL HIGH (ref 4.0–10.5)

## 2010-04-26 LAB — COMPREHENSIVE METABOLIC PANEL
ALT: 16 U/L (ref 0–35)
AST: 16 U/L (ref 0–37)
Albumin: 3.9 g/dL (ref 3.5–5.2)
Alkaline Phosphatase: 71 U/L (ref 39–117)
BUN: 12 mg/dL (ref 6–23)
CO2: 27 mEq/L (ref 19–32)
Calcium: 9.3 mg/dL (ref 8.4–10.5)
Chloride: 98 mEq/L (ref 96–112)
Creatinine, Ser: 0.68 mg/dL (ref 0.4–1.2)
GFR calc Af Amer: >60 mL/min (ref >60)
GFR calc non Af Amer: >60 mL/min (ref >60)
Glucose, Bld: 167 mg/dL — ABNORMAL HIGH (ref 70–99)
Potassium: 3.9 mEq/L (ref 3.5–5.1)
Sodium: 136 mEq/L (ref 135–145)
Total Bilirubin: 0.4 mg/dL (ref 0.3–1.2)
Total Protein: 7.7 g/dL (ref 6.0–8.3)

## 2010-04-26 LAB — GLUCOSE, CAPILLARY
Glucose-Capillary: 102 mg/dL — ABNORMAL HIGH (ref 70–99)
Glucose-Capillary: 115 mg/dL — ABNORMAL HIGH (ref 70–99)
Glucose-Capillary: 116 mg/dL — ABNORMAL HIGH (ref 70–99)
Glucose-Capillary: 116 mg/dL — ABNORMAL HIGH (ref 70–99)
Glucose-Capillary: 122 mg/dL — ABNORMAL HIGH (ref 70–99)
Glucose-Capillary: 123 mg/dL — ABNORMAL HIGH (ref 70–99)
Glucose-Capillary: 124 mg/dL — ABNORMAL HIGH (ref 70–99)
Glucose-Capillary: 125 mg/dL — ABNORMAL HIGH (ref 70–99)
Glucose-Capillary: 126 mg/dL — ABNORMAL HIGH (ref 70–99)
Glucose-Capillary: 127 mg/dL — ABNORMAL HIGH (ref 70–99)
Glucose-Capillary: 129 mg/dL — ABNORMAL HIGH (ref 70–99)
Glucose-Capillary: 133 mg/dL — ABNORMAL HIGH (ref 70–99)
Glucose-Capillary: 147 mg/dL — ABNORMAL HIGH (ref 70–99)
Glucose-Capillary: 148 mg/dL — ABNORMAL HIGH (ref 70–99)
Glucose-Capillary: 151 mg/dL — ABNORMAL HIGH (ref 70–99)
Glucose-Capillary: 155 mg/dL — ABNORMAL HIGH (ref 70–99)
Glucose-Capillary: 164 mg/dL — ABNORMAL HIGH (ref 70–99)
Glucose-Capillary: 165 mg/dL — ABNORMAL HIGH (ref 70–99)
Glucose-Capillary: 165 mg/dL — ABNORMAL HIGH (ref 70–99)
Glucose-Capillary: 168 mg/dL — ABNORMAL HIGH (ref 70–99)
Glucose-Capillary: 71 mg/dL (ref 70–99)
Glucose-Capillary: 75 mg/dL (ref 70–99)
Glucose-Capillary: 80 mg/dL (ref 70–99)
Glucose-Capillary: 80 mg/dL (ref 70–99)
Glucose-Capillary: 98 mg/dL (ref 70–99)

## 2010-04-26 LAB — LIPID PANEL
Cholesterol: 210 mg/dL — ABNORMAL HIGH (ref 0–200)
HDL: 35 mg/dL — ABNORMAL LOW (ref >39)
LDL Cholesterol: 143 mg/dL — ABNORMAL HIGH (ref 0–99)
Total CHOL/HDL Ratio: 6 RATIO
Triglycerides: 161 mg/dL — ABNORMAL HIGH (ref <150)
VLDL: 32 mg/dL (ref 0–40)

## 2010-04-26 LAB — PROTIME-INR
INR: 2.1 — ABNORMAL HIGH (ref 0.00–1.49)
INR: 2.15 — ABNORMAL HIGH (ref 0.00–1.49)
INR: 2.17 — ABNORMAL HIGH (ref 0.00–1.49)
INR: 2.23 — ABNORMAL HIGH (ref 0.00–1.49)
INR: 2.39 — ABNORMAL HIGH (ref 0.00–1.49)
INR: 2.66 — ABNORMAL HIGH (ref 0.00–1.49)
Prothrombin Time: 23.4 seconds — ABNORMAL HIGH (ref 11.6–15.2)
Prothrombin Time: 23.8 seconds — ABNORMAL HIGH (ref 11.6–15.2)
Prothrombin Time: 24 seconds — ABNORMAL HIGH (ref 11.6–15.2)
Prothrombin Time: 24.5 seconds — ABNORMAL HIGH (ref 11.6–15.2)
Prothrombin Time: 25.9 seconds — ABNORMAL HIGH (ref 11.6–15.2)
Prothrombin Time: 28.1 seconds — ABNORMAL HIGH (ref 11.6–15.2)

## 2010-04-26 LAB — DIFFERENTIAL
Basophils Absolute: 0 10*3/uL (ref 0.0–0.1)
Basophils Relative: 0 % (ref 0–1)
Eosinophils Absolute: 0 10*3/uL (ref 0.0–0.7)
Eosinophils Relative: 0 % (ref 0–5)
Lymphocytes Relative: 13 % (ref 12–46)
Lymphs Abs: 1.4 10*3/uL (ref 0.7–4.0)
Monocytes Absolute: 0.4 10*3/uL (ref 0.1–1.0)
Monocytes Relative: 4 % (ref 3–12)
Neutro Abs: 9.1 10*3/uL — ABNORMAL HIGH (ref 1.7–7.7)
Neutrophils Relative %: 83 % — ABNORMAL HIGH (ref 43–77)

## 2010-04-26 LAB — APTT: aPTT: 37 seconds (ref 24–37)

## 2010-04-28 ENCOUNTER — Ambulatory Visit (INDEPENDENT_AMBULATORY_CARE_PROVIDER_SITE_OTHER): Payer: Medicare Other | Admitting: *Deleted

## 2010-04-28 DIAGNOSIS — Z7901 Long term (current) use of anticoagulants: Secondary | ICD-10-CM

## 2010-04-28 DIAGNOSIS — I82409 Acute embolism and thrombosis of unspecified deep veins of unspecified lower extremity: Secondary | ICD-10-CM

## 2010-04-28 DIAGNOSIS — Z8679 Personal history of other diseases of the circulatory system: Secondary | ICD-10-CM

## 2010-04-28 LAB — POCT INR: INR: 2.8

## 2010-04-30 LAB — BASIC METABOLIC PANEL
BUN: 12 mg/dL (ref 6–23)
CO2: 27 mEq/L (ref 19–32)
Calcium: 9.4 mg/dL (ref 8.4–10.5)
Chloride: 97 mEq/L (ref 96–112)
Creatinine, Ser: 0.81 mg/dL (ref 0.4–1.2)
GFR calc Af Amer: >60 mL/min (ref >60)
GFR calc non Af Amer: >60 mL/min (ref >60)
Glucose, Bld: 158 mg/dL — ABNORMAL HIGH (ref 70–99)
Potassium: 4.3 mEq/L (ref 3.5–5.1)
Sodium: 135 mEq/L (ref 135–145)

## 2010-04-30 LAB — GLUCOSE, CAPILLARY
Glucose-Capillary: 156 mg/dL — ABNORMAL HIGH (ref 70–99)
Glucose-Capillary: 182 mg/dL — ABNORMAL HIGH (ref 70–99)
Glucose-Capillary: 110 mg/dL — ABNORMAL HIGH (ref 70–99)
Glucose-Capillary: 120 mg/dL — ABNORMAL HIGH (ref 70–99)
Glucose-Capillary: 122 mg/dL — ABNORMAL HIGH (ref 70–99)
Glucose-Capillary: 125 mg/dL — ABNORMAL HIGH (ref 70–99)
Glucose-Capillary: 125 mg/dL — ABNORMAL HIGH (ref 70–99)
Glucose-Capillary: 128 mg/dL — ABNORMAL HIGH (ref 70–99)
Glucose-Capillary: 139 mg/dL — ABNORMAL HIGH (ref 70–99)
Glucose-Capillary: 139 mg/dL — ABNORMAL HIGH (ref 70–99)
Glucose-Capillary: 150 mg/dL — ABNORMAL HIGH (ref 70–99)
Glucose-Capillary: 155 mg/dL — ABNORMAL HIGH (ref 70–99)
Glucose-Capillary: 167 mg/dL — ABNORMAL HIGH (ref 70–99)
Glucose-Capillary: 173 mg/dL — ABNORMAL HIGH (ref 70–99)
Glucose-Capillary: 64 mg/dL — ABNORMAL LOW (ref 70–99)
Glucose-Capillary: 96 mg/dL (ref 70–99)
Glucose-Capillary: 98 mg/dL (ref 70–99)

## 2010-04-30 LAB — CBC
HCT: 36.4 % (ref 36.0–46.0)
HCT: 37.6 % (ref 36.0–46.0)
HCT: 37.9 % (ref 36.0–46.0)
Hemoglobin: 12.4 g/dL (ref 12.0–15.0)
Hemoglobin: 13.1 g/dL (ref 12.0–15.0)
Hemoglobin: 13.1 g/dL (ref 12.0–15.0)
MCHC: 34.2 g/dL (ref 30.0–36.0)
MCHC: 34.4 g/dL (ref 30.0–36.0)
MCHC: 34.8 g/dL (ref 30.0–36.0)
MCV: 87.2 fL (ref 78.0–100.0)
MCV: 88.2 fL (ref 78.0–100.0)
MCV: 88.4 fL (ref 78.0–100.0)
Platelets: 261 10*3/uL (ref 150–400)
Platelets: 278 10*3/uL (ref 150–400)
Platelets: 280 10*3/uL (ref 150–400)
RBC: 4.11 MIL/uL (ref 3.87–5.11)
RBC: 4.3 MIL/uL (ref 3.87–5.11)
RBC: 4.31 MIL/uL (ref 3.87–5.11)
RDW: 13.6 % (ref 11.5–15.5)
RDW: 13.7 % (ref 11.5–15.5)
RDW: 13.8 % (ref 11.5–15.5)
WBC: 8.5 10*3/uL (ref 4.0–10.5)
WBC: 8.6 10*3/uL (ref 4.0–10.5)
WBC: 9.6 10*3/uL (ref 4.0–10.5)

## 2010-04-30 LAB — PROTIME-INR
INR: 1.7 — ABNORMAL HIGH (ref 0.00–1.49)
INR: 1.7 — ABNORMAL HIGH (ref 0.00–1.49)
INR: 2 — ABNORMAL HIGH (ref 0.00–1.49)
INR: 2.2 — ABNORMAL HIGH (ref 0.00–1.49)
Prothrombin Time: 19.6 seconds — ABNORMAL HIGH (ref 11.6–15.2)
Prothrombin Time: 20.1 seconds — ABNORMAL HIGH (ref 11.6–15.2)
Prothrombin Time: 22.9 seconds — ABNORMAL HIGH (ref 11.6–15.2)
Prothrombin Time: 24.1 seconds — ABNORMAL HIGH (ref 11.6–15.2)

## 2010-04-30 LAB — CARDIAC PANEL(CRET KIN+CKTOT+MB+TROPI)
CK, MB: 1.2 ng/mL (ref 0.3–4.0)
CK, MB: 1.3 ng/mL (ref 0.3–4.0)
CK, MB: 1.4 ng/mL (ref 0.3–4.0)
Relative Index: INVALID (ref 0.0–2.5)
Relative Index: INVALID (ref 0.0–2.5)
Relative Index: INVALID (ref 0.0–2.5)
Total CK: 54 U/L (ref 7–177)
Total CK: 64 U/L (ref 7–177)
Total CK: 70 U/L (ref 7–177)
Troponin I: 0.02 ng/mL (ref 0.00–0.06)
Troponin I: 0.02 ng/mL (ref 0.00–0.06)
Troponin I: 0.03 ng/mL (ref 0.00–0.06)

## 2010-04-30 LAB — POCT CARDIAC MARKERS
CKMB, poc: <1 ng/mL — ABNORMAL LOW (ref 1.0–8.0)
CKMB, poc: <1 ng/mL — ABNORMAL LOW (ref 1.0–8.0)
Myoglobin, poc: 58.3 ng/mL (ref 12–200)
Myoglobin, poc: 71.4 ng/mL (ref 12–200)
Troponin i, poc: <0.05 ng/mL (ref 0.00–0.09)
Troponin i, poc: <0.05 ng/mL (ref 0.00–0.09)

## 2010-04-30 LAB — DIFFERENTIAL
Basophils Absolute: 0 10*3/uL (ref 0.0–0.1)
Basophils Absolute: 0.1 10*3/uL (ref 0.0–0.1)
Basophils Relative: 0 % (ref 0–1)
Basophils Relative: 1 % (ref 0–1)
Eosinophils Absolute: 0.2 10*3/uL (ref 0.0–0.7)
Eosinophils Absolute: 0.3 10*3/uL (ref 0.0–0.7)
Eosinophils Relative: 3 % (ref 0–5)
Eosinophils Relative: 4 % (ref 0–5)
Lymphocytes Relative: 19 % (ref 12–46)
Lymphocytes Relative: 24 % (ref 12–46)
Lymphs Abs: 1.6 10*3/uL (ref 0.7–4.0)
Lymphs Abs: 2 10*3/uL (ref 0.7–4.0)
Monocytes Absolute: 0.5 10*3/uL (ref 0.1–1.0)
Monocytes Absolute: 0.5 10*3/uL (ref 0.1–1.0)
Monocytes Relative: 5 % (ref 3–12)
Monocytes Relative: 6 % (ref 3–12)
Neutro Abs: 5.5 10*3/uL (ref 1.7–7.7)
Neutro Abs: 6.2 10*3/uL (ref 1.7–7.7)
Neutrophils Relative %: 65 % (ref 43–77)
Neutrophils Relative %: 72 % (ref 43–77)

## 2010-04-30 LAB — CLOSTRIDIUM DIFFICILE EIA: C difficile Toxins A+B, EIA: NEGATIVE

## 2010-05-13 ENCOUNTER — Other Ambulatory Visit (HOSPITAL_COMMUNITY): Payer: Self-pay | Admitting: "Endocrinology

## 2010-05-13 DIAGNOSIS — R1110 Vomiting, unspecified: Secondary | ICD-10-CM

## 2010-05-13 DIAGNOSIS — R1011 Right upper quadrant pain: Secondary | ICD-10-CM

## 2010-05-13 DIAGNOSIS — R110 Nausea: Secondary | ICD-10-CM

## 2010-05-19 ENCOUNTER — Ambulatory Visit (INDEPENDENT_AMBULATORY_CARE_PROVIDER_SITE_OTHER): Payer: Medicare Other | Admitting: *Deleted

## 2010-05-19 DIAGNOSIS — Z7901 Long term (current) use of anticoagulants: Secondary | ICD-10-CM

## 2010-05-19 DIAGNOSIS — Z8679 Personal history of other diseases of the circulatory system: Secondary | ICD-10-CM

## 2010-05-19 DIAGNOSIS — I82409 Acute embolism and thrombosis of unspecified deep veins of unspecified lower extremity: Secondary | ICD-10-CM

## 2010-05-19 LAB — POCT INR: INR: 2.7

## 2010-06-07 NOTE — Consult Note (Signed)
[name], [name]               ACCOUNT NO.:  [id number]   MEDICAL RECORD NO.:  [id number]          PATIENT TYPE:  INP   LOCATION:  A305                          FACILITY:  [facility]   PHYSICIAN:  Maisie Fus C. Wall, MD, FACCDATE OF BIRTH:  16-Jul-1945   DATE OF CONSULTATION:  DATE OF DISCHARGE:                                 CONSULTATION   I was asked by Dr. [name] to evaluate [name] with chest discomfort  and cardiac risk factors.   HISTORY OF PRESENT ILLNESS:  She is 65 years of age, well-developed  white female who lives in [place].  She says she decided to go there  because she did not think she can take care of herself.   She has a history of type 2 diabetes, hypertension and history of  recurrent DVT of the left lower extremity.   She had a tomato sandwich around 12:15 on the day of presentation.  She  had of having reflux with classic water brash most of the afternoon and  evening.  She was then brought to the emergency room where her pain was  partially relieved by nitroglycerin.  She was also given 4 aspirin.   Her EKG showed some mild ST changes inferiorly, which have actually  stabilized and remained the same over her hospitalization.  Her cardiac  enzymes were negative x3.  Chest x-ray was nonrevealing.   PAST MEDICAL HISTORY:  Significant for the above including depression.  She has a history of psychosis and history of alcohol abuse.  She has  history of disk disease.   MEDICATIONS:  Her meds on admission were;  1. Clonazepam 0.5 mg at bedtime.  2. Metformin 1000 mg p.o. b.i.d.  3. Lantus insulin 60 units subcu in the morning, 10 in the evening.  4. Remeron 45 mg per day.  5. Cymbalta 60 mg a day.  6. Risperdal 1 mg per day.  7. Reglan 5 mg before meals at night.  8. Omeprazole 20 mg a day.  9. Glipizide 10 mg a day.   SOCIAL HISTORY:  She lives at [facility].  She is widowed.  She has a  history of remote alcohol and tobacco use.   FAMILY HISTORY:   Noncontributory.   REVIEW OF SYSTEMS:  Other than the HPI is negative.  She specifically  denies any exertional chest pain, orthopnea, PND, peripheral edema or  melena.   PHYSICAL EXAMINATION:  VITAL SIGNS:  Her blood pressure is 125/88, her  pulse was 100 and regular.  EKG confirms sinus tach.  Temperature is  96.7, respirations 18, sats 93% and then 97% on room air.  GENERAL:  She is in no acute distress.  SKIN:  Pale and dry.  HEENT:  She wears glasses.  NECK:  Carotid upstrokes were equal bilaterally without bruits.  Thyroid  is not enlarged.  Trachea is midline.  Neck is supple.  CHEST:  Lungs  clear to auscultation and percussion.  HEART:  Regular rate and rhythm without murmur, rub or gallop.  PMI is  nondisplaced.  ABDOMEN:  Soft, good bowel sounds.  No tenderness.  EXTREMITIES:  No cyanosis, clubbing or edema.  Pulses are intact.  NEURO:  Intact.  MUSCULOSKELETAL:  Consistent with chronic arthritic changes.   LABORATORY DATA:  X-rays, EKGs have been reviewed.   ASSESSMENT:  1. Noncardiac chest pain.  I think it is gastroesophageal reflux.  She      does have significant risk factors and considering her age and type      2 diabetes, she has coronary artery disease equivalency.  2. Sinus tachycardia, probably from anxiety.  3. Other problems listed above.   RECOMMENDATIONS:  1. Begin metoprolol 25 b.i.d.  2. Continue other medications.  3. Symptoms of coronary artery syndrome or angina have been reviewed      with the patient how to respond.   I would probably hold aspirin at this time since she is already on  Coumadin.  You could consider 81 mg per day in the future.   Thank you for the consultation.      Thomas C. Daleen Squibb, MD, [facility]  Electronically Signed     TCW/MEDQ  D:  09/04/2008  T:  09/05/2008  Job:  [id number]

## 2010-06-07 NOTE — H&P (Signed)
NAME:  [name], [name]               ACCOUNT NO.:  [id number]   MEDICAL RECORD NO.:  [id number]          PATIENT TYPE:  INP   LOCATION:  A302                          FACILITY:  [facility]   PHYSICIAN:  [name] D. [name], MD   DATE OF BIRTH:  1945-07-04   DATE OF ADMISSION:  02/01/2007  DATE OF DISCHARGE:  [facility]                              HISTORY & PHYSICAL   CHIEF COMPLAINT:  Left leg swelling.   HISTORY OF PRESENT ILLNESS:  This is a 65 year old female patient with  history of left leg swelling who is a resident of local rest home and  came to the emergency room with the above complaint.  The patient  noticed worsening swelling of the left leg over the last 3 days.  She  has also pain and tightness.  There is no history of thromboembolic  accident or fall.  The patient gives history of deep venous thrombosis  about 3 years back on the same leg.  The patient was evaluated in the  office and urgent venous Doppler was done which showed deep venous  thrombosis.  The patient was admitted for anticoagulation and further  treatment.   REVIEW OF SYSTEMS:  The patient has no headache, fevers, chills, cough,  nausea, vomiting, abdominal pain, chest pain, shortness of breath,  dysuria, urgency or frequency of urination.   PAST MEDICAL HISTORY:  1. Diabetes mellitus.  2. Hypertension.  3. History of left leg deep venous thrombosis about 3 years back.  4. Major depressive disorder.  5. History of psychosis.  6. Alcohol abuse.  7. Discogenic disease.  8. Gastroesophageal reflux disease.   CURRENT MEDICATIONS:  1. Clonazepam 0.5 mg at bedtime.  2. Naprosyn one tablet b.i.d.  3. Omeprazole 20 mg daily.  4. Reglan 5 mg p.o. in the morning and nightly.  5. Glucotrol half tablet p.o. b.i.d.  6. Metoprolol one tablet b.i.d.  7. Risperdal one tablet p.o. at bedtime.  8. Cymbalta 20 mg at bedtime.  9. Remeron 45 mg p.o. at bedtime.  10.Acetaminophen 500 mg one tablet p.o. q.6 h.   SOCIAL HISTORY:   The patient is a widow.  She is currently a resident of  local rest home.  She has history of alcohol abuse.  The patient also  has history of tobacco smoking, however, the patient stopped smoking  about 15 years back.  No history of substance abuse.   PHYSICAL EXAMINATION:  GENERAL:  The patient is alert, awake and  comfortable with vital signs of blood pressure 178/91, pulse 102,  respirations 22, temperature 97.0 degrees Fahrenheit.  HEENT:  Pupils equal round and reactive to light.  NECK:  Supple.  CHEST:  Clear lung fields with good air entry.  CARDIAC:  S1, S2 heard.  No murmurs, rubs or gallops.  ABDOMEN:  Soft and lax.  Bowel sounds positive with no mass or  organomegaly.  EXTREMITIES:  Diffuse swelling of the right leg.  There is tenderness  and positive Hoffman sign.   LABORATORY DATA AND X-RAY FINDINGS:  On admission, PT 25, PTT 12.7, INR  0.9.  CBC  with WBC 8.6, hemoglobin 12.1, hematocrit 36.5, platelets 297.  Sodium 136, potassium 3.8, chloride 101, carbon dioxide 22, glucose 248,  BUN 11, creatinine 0.5.  Bilirubin 0.4, Alk phos 98, AST 27, ALT 25,  total protein 6.7, albumin 3.7, calcium 9.0.   ASSESSMENT:  1. Recurrent deep venous thrombosis of left leg.  2. Diabetes mellitus.  3. Hypertension.  4. History of psychosis.  5. Depression disorder.  6. Discogenic disease.   PLAN:  Will start the patient on heparin drip.  Will also start her on  Coumadin.  Will continue her regular medications.  Continue supportive  care.      [name] D. [name], MD  Electronically Signed     TDF/MEDQ  D:  02/01/2007  T:  02/01/2007  Job:  [id number]

## 2010-06-07 NOTE — H&P (Signed)
NAMESHAREL, BEHNE               ACCOUNT NO.:  [id number]   MEDICAL RECORD NO.:  [id number]          PATIENT TYPE:  INP   LOCATION:  A305                          FACILITY:  [facility]   PHYSICIAN:  [name] D. [name], MD   DATE OF BIRTH:  1945/02/19   DATE OF ADMISSION:  09/03/2008  DATE OF DISCHARGE:  LH                              HISTORY & PHYSICAL   CHIEF COMPLAINT:  Chest pain.   HISTORY OF PRESENT ILLNESS:  This is a 65 years old female patient with  a history of diabetes mellitus, hypertension, and deep venous  thrombosis, was brought to emergency room after she had chest pain.  The  patient described the pain-like pressure and also some vague symptoms on  the left side close to her sternum.  She was seen in the emergency room  and her pain relieved after she was given nitroglycerin.  However, the  patient thought it was symptoms of gastroesophageal reflux.  She had  cardiac enzymes and EKG initially in the emergency room, which were  negative for any acute ischemia.  However, due to her multiple risk  factors, the patient was admitted for further evaluation.   REVIEW OF SYSTEMS:  The patient had no fever, headache, cough, shortness  of breath, nausea, vomiting, abdominal pain, dysuria, urgency, or  frequency of urination.   PAST MEDICAL HISTORY:  1. Diabetes mellitus.  2. Hypertension.  3. Left leg deep venous thrombosis, which is recurrent.  4. Depression disorder.  5. Psychosis.  6. History of alcohol abuse.  7. Gastroesophageal reflux disease.  8. Diskogenic disease.   CURRENT MEDICATIONS:  1. Clonazepam 0.5 mg at bedtime.  2. Metformin 1000 mg b.i.d.  3. Lantus insulin 60 units subcu in a.m. and 10 units in p.m.  4. Remeron 45 mg daily.  5. Cymbalta 60 mg daily.  6. Risperdal 1 mg daily.  7. Reglan 5 mg p.o. a.c. and nightly.  8. Omeprazole 20 mg daily.  9. Glipizide 10 mg daily.   SOCIAL HISTORY:  The patient is currently a resident of [facility].   She has a remote history of alcohol and tobacco abuse.   PHYSICAL EXAMINATION:  GENERAL:  The patient is alert, awake, and  comfortable.  VITAL SIGNS:  Blood pressure 128/60, pulse 99, respiratory rate 20, and  temperature 97.7 degrees Fahrenheit.  HEENT:  Pupils are equal and reactive.  NECK:  Supple.  CHEST:  Clear.  Lung field good air entry.  CARDIOVASCULAR SYSTEM:  First and second heart sound heard.  No murmur.  No gallop.  ABDOMEN:  Soft and lax.  Bowel sound is positive.  No mass or  organomegaly.  EXTREMITIES:  No leg edema.   LABORATORY DATA:  CBC; WBC 9.6, hemoglobin 13.1, and hematocrit 37.6.  Myoglobin 58.3, CPK less than 1.  Troponin less than 0.03.  PT 24, INR  2.2.  BMP; sodium 135, potassium 4.3, chloride 97, carbon dioxide 27,  glucose 158, BUN 12, creatinine 0.1, and calcium 9.4.   ASSESSMENT:  This is a 65 years old female patient with history  of  multiple medical illnesses, who was brought due to chest pain.  The  patient has a history of gastroesophageal reflux disease.  However, due  to the fact that her pain responded to nitroglycerin as well as due to  her multiple cardiac risk factors, we will admit the patient and  continued to do serial EKG, cardiac enzymes.  We will do also Cardiology  consult.  We will continue the patient on her regular medications.      [name] D. [name], MD  Electronically Signed     TDF/MEDQ  D:  09/04/2008  T:  09/04/2008  Job:  [id number]

## 2010-06-10 NOTE — Discharge Summary (Signed)
NAMETAJAE, MAIOLO               ACCOUNT NO.:  [id number]   MEDICAL RECORD NO.:  [id number]          PATIENT TYPE:  INP   LOCATION:  1413                         FACILITY:  [facility]   PHYSICIAN:  Danae Chen, M.D.DATE OF BIRTH:  [birth year]   DATE OF ADMISSION:  07/14/2004  DATE OF DISCHARGE:  07/18/2004                                 DISCHARGE SUMMARY   DISCHARGE DIAGNOSES:  1.  Left lower leg deep venous thrombosis.  2.  History of depression and anxiety.  3.  History of alcohol abuse.  4.  History of hypertension.  5.  Reflux disease.  6.  Chronic low back pain status post L5-S1 microdiskectomy.   DISCHARGE MEDICATIONS:  1.  Coumadin 5 mg p.o. daily.  2.  Xanax 2 mg p.o. t.i.d. p.r.n. for anxiety.  3.  Wellbutrin XL 300 mg p.o. daily.   FOLLOW UP:  The patient is to followup with [facility] and her  primary care physician in 1-2 weeks. She will followup with [facility] or  Dr. Benedetto Goad who she has seen in the past in the next week for followup  of her PT/INR.   CONSULTATIONS:  None.   PROCEDURE:  Venous Doppler's revealed left lower extremity calf popliteal  femoral DVT. CT angiogram revealed no evidence of pulmonary embolism. CT  angiogram revealed no evidence of pulmonary embolism.   HOSPITAL COURSE:  The patient is a 65 year old white female well known to  our service with a history of chronic anxiety, depression and personality  disorder who was admitted for evaluation of left lower extremity swelling  and pain. She was found to have a therapeutic INR level but had recurrent  DVT but no PE.  She was admitted for further monitoring and anticoagulation.  We also, because of her chronic low back pain had obtained ortho consult per  Dr. Ophelia Charter who examined the patient, please see his consultation note. Felt  that she was not a candidate for repeat surgery at this time but he would  followup with her as an outpatient. The patient regarding her  anxiety and  depression denies any suicidal ideation but does report that she has  continuously been addicted to prescription pain medications and wishes to  seek treatment. She has been in [facility] for inpatient treatment in  the past.   At the time of discharge, her INR is 1.9 and she will be sent home on 5 mg a  day and have that followed up. Our goal is between 2 and 3 but because she  has had recurrent DVT while on therapeutic levels, we will shoot for the  higher end of the goal, between 2.5 and 3. The patient is also encouraged to  seek help from adult alcohol and drug services and Behavioral Health if  needed.   CONDITION ON DISCHARGE:  Improved.   PHYSICAL EXAMINATION ON DISCHARGE:  VITAL SIGNS:  The patient is afebrile,  vital signs are stable. Blood pressure is 104/54, pulse 77, temperature  98.3.  LUNGS:  She has no complaints of shortness of breath or chest pain.  Lungs  are clear.  HEART:  Rate is regular.  ABDOMEN:  Soft.  EXTREMITIES:  She has no peripheral edema, swelling of her left lower leg  has decreased dramatically. She has no calf tenderness or pain.   As noted followup with either [facility] or Dr. Benedetto Goad in the next  week. The patient is to call for appointment and she will need to have her  PT/INR checked at that time and a goal between 2.5 and 3. She was given a  prescription for Xanax 60 tables, 1 mg a piece as well as Coumadin 5 mg  tablets, 30 tablets.       RLK/MEDQ  D:  07/18/2004  T:  07/18/2004  Job:  [id number]

## 2010-06-10 NOTE — Discharge Summary (Signed)
[name], [name]               ACCOUNT NO.:  [id number]   MEDICAL RECORD NO.:  [id number]          PATIENT TYPE:  INP   LOCATION:  2109                         FACILITY:  [facility]   PHYSICIAN:  Gertha Calkin, M.D.DATE OF BIRTH:  01-15-1946   DATE OF ADMISSION:  06/16/2004  DATE OF DISCHARGE:                                 DISCHARGE SUMMARY   PRIMARY CARE PHYSICIAN:  Dr. Benedetto Bauer.   NEUROSURGERY:  Dr. Ophelia Bauer.   DISCHARGE DIAGNOSES:  1. Suicide attempt/beta blocker overdose/probable benzodiazepine overdose.  2. Severe depression with history of previous [facility] inpatient treatment      for prior suicide attempt.  3. Hypertension.  4. Hypotension.  5. Chronic sinusitis.  6. Chronic lower back pain status post microdiskectomy per Dr. Ophelia Bauer -      March 2006.  7. Status post hysteroscopy with a dilatation and curettage in March 2001.  8. Gastroesophageal reflux disease.  9. Chronic abdominal pain x6 months.  10.Hyperglycemia.  11.Hypokalemia.     DISCHARGE MEDICATIONS:  1. Protonix 40 mg p.o. daily.  2. Multivitamin daily.  3. Acetaminophen 650 mg p.o. q.6h. p.r.n.  4. Claritin 10 mg p.o. daily p.r.n.  5. Laxative of choice p.o. p.r.n.     HOME MEDICATIONS:  See H&P.   DIAGNOSTIC STUDIES:  1. Jun 17, 2004:  Chest x-ray portable shows patchy air space disease in      the right lower lobe.  2. EKG shows normal sinus rhythm, normal intervals, axes.     HOSPITAL COURSE:  Please see H&P for details of admission.   #1 - SUICIDE ATTEMPT/OVERDOSE ON BETA BLOCKERS AND POSSIBLY BENZODIAZEPINES.  The patient was admitted, initially placed on a glucagon drip for beta  blocker overdose. Initially her blood pressures were stabilized and then she  became hypotensive again. It is known that high levels of glucagon can cause  hypotension as a side effect and therefore she was transitioned over to an  epinephrine drip. She has had no arrhythmias and her blood pressure has  been  maintained, and she was slowly weaned off of the epinephrine drip as of  yesterday morning. Her blood pressures have remained stable. Most recent  blood pressure this morning systolic of 115, diastolic 155. Her heart rate  has remained stable in the 60s to 70s. She at this time is more alert and is  able to have good conversation. During this hospitalization she did not  require intubation or even BiPAP support.   #2 - SEVERE DEPRESSION/SUICIDE ATTEMPT. The patient was evaluated by Dr.  Jeanie Bauer. Please refer to his consult notes for details of recommendations.  Essentially, the patient is willing and accepted inpatient treatment for her  depressive symptoms. Dr. Jeanie Bauer has deferred initiating any psychotropic  medications to receiving facility. Refer to H&P for previous home  psychotropic medications. Currently, she had a 24-hour sitter during this  hospitalization.   #3 - HYPERTENSION/HYPOTENSION SECONDARY TO BETA BLOCKER OVERDOSE. As above,  blood pressures have remained stable. Have not resumed any of her  antihypertensive medications.   #4 - CHRONIC ABDOMINAL PAIN, QUESTION CONSTIPATION. No  acute emergent  symptoms noted. She does not have any rigidity, rebound, or voluntary  guarding. Bowel sounds were present. She does have some slight bit of  firmness to the right lower quadrant and lower abdomen. This may be  constipation and therefore am writing for p.r.n. laxatives. Would follow up  but no acute needs.   #5 - HYPERGLYCEMIA. Most likely related to her glucagon drip that was  initiated on admission. At this time checking A1c but that laboratory will  be pending and needs to be followed up. Currently we have been covering her  on sliding scale insulin.   #6 - HYPOKALEMIA. Have replenished and she has responded well and will be  checking an electrolyte level prior to transfer to the facility. This  regardless of the level would not defer transfer. She has not shown  any  signs of arrhythmias during her stay here at [facility].   #7 - LOWER BACK PAIN. This is chronic, has had recent surgery. No active  issues during this hospitalization.   DISPOSITION:  She is improved, more alert and oriented. She remains in a  depressive state. Her vital signs are stable. We will be checking labs prior  to discharge.      JD/MEDQ  D:  06/18/2004  T:  06/18/2004  Job:  [id number]   cc:   Behavioral Health   [name]. [name], M.D.  [address]  [place]  [place] [address]  Fax: [phone]   [name]. [name], M.D.  [address] [place], [place] [address]  Fax: [phone]

## 2010-06-10 NOTE — Op Note (Signed)
[name], [name]               ACCOUNT NO.:  [id number]   MEDICAL RECORD NO.:  [id number]          PATIENT TYPE:  OIB   LOCATION:  5038                         FACILITY:  [facility]   PHYSICIAN:  Mark C. Ophelia Charter, M.D.    DATE OF BIRTH:  10/15/1945   DATE OF PROCEDURE:  03/30/2004  DATE OF DISCHARGE:                                 OPERATIVE REPORT   PREOPERATIVE DIAGNOSIS:  Right L5-S1 herniated nucleus pulposus.   POSTOPERATIVE DIAGNOSIS:  Right L5-S1 herniated nucleus pulposus.   PROCEDURE:  Right L5-S1 microdiskectomy.   SURGEON:  Mark C. Ophelia Charter, M.D.   ANESTHESIA:  GOT.   ESTIMATED BLOOD LOSS:  Minimal.   Marcaine skin local at end of case.  Less than 10 cc.   PROCEDURE:  After induction of general anesthesia, preoperative Ancef  prophylaxis, standard prepping of the back, the area was scrubbed with  towels, Betadine, Vi-Drape application, thyroid sheet, spinal needle, cross-  table lateral x-ray, localizing the L5-S1 space.  Ligament was incised.  Subperiosteal dissection, then Taylor retractor placed laterally with 5-  pound weight.  Laminotomy was performed.  The ligamentum was incised.  Operative microscope was draped, brought in.  The posterior ligaments were  removed.  There were some excessive ligaments in the lateral gutter on the  right and some mild facet overgrowth.  There was a firm disc bulge of which  portions had partially ossified, consistent with an old disc rupture with  some fresh disc material underneath it.  Once this was removed, the  osteophytes were adjacent to each other, and only in a couple areas was I  able to slip a micropituitary into the space since the space was so tight.  The Epstein curet was used to tease some disc material from the midline  laterally which was also old and firm and fibrotic.  Once this was cleaned  out, the foramina was enlarged.  Nerve root was free, and Epstein was used  laterally at the foramina from above to push some  disc material.  I pulled  it in to the operative field, where it was seen and removed with the  micropituitary.  Passes were made inside the disc as well with downbiting  pituitary.  Once the fragments were decompressed, there was no longer any  midline compression.  No fragments remained again.  All soft tissue had been  cleaned with posterior body of C5 and S1 well visualized, and small vein  laterally being coagulated.  Operative field was irrigated.  Passes were  made with the hockey stick anterior to the dura, out the foramina underneath  the pedicle, with no remaining areas of compression.  An 180-degree sweep  could be made with the hockey stick, with good freedom.  Air was no longer  being displaced.  The  operative field was irrigated, closed with 0 Vicryl in the fascia, 2-0  Vicryl in the subcutaneous tissue, subcuticular skin closure.  Tincture of  Benzoin and Steri-Strips, and Marcaine infiltration, postop dressing, and  transferred to the recovery room.      MCY/MEDQ  D:  03/30/2004  T:  03/31/2004  Job:  [id number]

## 2010-06-10 NOTE — Discharge Summary (Signed)
NAME:  STAISHA, WINIARSKI               ACCOUNT NO.:  [id number]   MEDICAL RECORD NO.:  [id number]          PATIENT TYPE:  INP   LOCATION:  A302                          FACILITY:  [facility]   PHYSICIAN:  [name] D. [name], MD   DATE OF BIRTH:  Nov 19, 1945   DATE OF ADMISSION:  02/01/2007  DATE OF DISCHARGE:  01/14/2009LH                               DISCHARGE SUMMARY   DISCHARGE DIAGNOSES:  1. Left leg deep venous thrombosis.  2. Diabetes mellitus.  3. Hypertension.  4. Depression disorder.  5. Psychosis.  6. History of alcohol abuse.  7. Diskogenic disease.  8. Gastroesophageal reflux disease.   DISCHARGE MEDICATIONS:  1. Coumadin 3 mg p.o. daily.  2. Klonopin 0.5 mg at bedtime.  3. Omeprazole 20 mg daily.  4. Reglan 5 mg q.a.c. and q.h.s.  5. Glipizide 5 mg 1/2 tablet b.i.d.  6. Metformin 500 mg b.i.d.  7. Risperdal 1 mg at bedtime.  8. Cymbalta 20 mg 2 capsules every morning.  9. Remeron 45 mg at bedtime.   DISPOSITION:  The patient was discharged back to rest home.   HOSPITAL COURSE:  This is a 65 year old female patient with a history of  multiple medical illnesses who was admitted from a local rest home due  to left leg swelling.  Ultrasound was done as an outpatient showed deep  venous thrombosis.  The patient gives history of similar episode about 3  years back.  She was admitted and anticoagulated.  After she achieved a  therapeutic level of INR, the patient was discharged back to rest home  to continue monitoring her INR as an outpatient.      [name] D. [name], MD  Electronically Signed    TDF/MEDQ  D:  03/07/2007  T:  03/08/2007  Job:  [id number]

## 2010-06-10 NOTE — H&P (Signed)
[name], [name]               ACCOUNT NO.:  [id number]   MEDICAL RECORD NO.:  [id number]          PATIENT TYPE:  EMS   LOCATION:  ED                           [facility]ACILITY:  [facility]   PHYSICIAN:  Isidor Holts, M.D.  DATE OF BIRTH:  1945/05/22   DATE OF ADMISSION:  07/14/2004  DATE OF DISCHARGE:                                HISTORY & PHYSICAL   PRIMARY CARE PHYSICIAN:  Previously Dr. Benedetto Goad but is now unassigned.   CHIEF COMPLAINT:  Pain/swelling left leg few days.   HISTORY OF PRESENT ILLNESS:  This is a 65 year old female, admitted to [facility] Jun 19, 2004 through Jun 22, 2004 with left leg DVT and  suicidal ideation/attempt. She was commenced on anticoagulation at that  time, subsequently discharged to [facility]. She was discharged home  finally on July 08, 2004. The patient states that she has been compliant  with her anticoagulation medications, has not, however, followed up with her  erstwhile PMD, Dr. Benedetto Goad, because she claims that she is changing PMD  and plans to establish care with her daughter's MD. INR has not been checked  since discharge. The patient admits to occasional shortness of breath which  she attributes to anxiety attacks, denies chest pain or fever, states that  she has had increased leg swelling/pain for the past couple of days.   PAST MEDICAL HISTORY:  1.  Depression/anxiety.  2.  History of suicidal attempt/drug overdose.  3.  Chronic sinusitis.  4.  Hypertension.  5.  Chronic low back pain status post L5-S1 microdiskectomy, March 2006.  6.  Status post hysteroscopy/D&C in 2001.  7.  GERD.   MEDICATIONS:  1.  Coumadin 4 mg p.o. daily.  2.  Ativan 1 mg p.o. t.i.d.  3.  Wellbutrin XL 300 mg p.o. q.a.m.   The patient was discharged on a variety of other medications.  For these,  see discharge summary dated Jun 22, 2004. She has, however, not been  compliant.   ALLERGIES:  No known drug allergies.   REVIEW OF  SYSTEMS:  Essentially as per HPI and chief complaint otherwise  negative.   SOCIAL HISTORY:  The patient is a widow, ex-smoker, quit approximately 15  years ago, denies alcohol abuse, although she has the smell of alcohol on  her breath and alcohol level in the emergency department is 51. No history  of illegal drug use. The patient lives alone but has a daughter who lives in  [place] and who is supportive.   FAMILY HISTORY:  Her mother had a history of venous thromboembolic disease,  family history is otherwise noncontributory.   PHYSICAL EXAMINATION:  VITAL SIGNS:  Temperature 97.0, pulse 98 per minute,  regular, respiratory rate 20, BP 116/68 mmHg, pulse oximeter 98% on room  air.  HEENT:  No clinical pallor, no jaundice, no conjunctival injection. Throat  is quite clear.  NECK:  Supple, JVD not seen. No palpable lymphadenopathy, no palpable  goiter, no carotid bruits.  CHEST:  Clinically clear to auscultation, no wheezes or crackles.  HEART:  Heart sounds 1  and 2 heard, normal, regular, no murmurs.  ABDOMEN:  Full, soft and nontender. There is no palpable organomegaly, no  palpable masses. Normal bowel sounds.  LOWER EXTREMITIES:  The patient has significant swelling of left calf  associated with tenseness and tenderness and increased local temperature but  there is no significant erythema or other foot lesions.  MUSCULOSKELETAL:  The patient has mild tenderness to palpation of  lumbosacral spine, otherwise no abnormalities found.  CENTRAL NERVOUS SYSTEM:  No focal neurologic deficits on gross examination.   LABORATORY DATA:  CBC, WBC 5.7, hemoglobin 10.9, hematocrit 31.7, platelets  365. Electrolytes, sodium 143, potassium 3.3. Chloride 109, CO2 25, BUN 8,  creatinine 0.7, glucose 122, AST 15, ALT 14, alkaline phosphatase 87.  Alcohol level 51. INR 2.5.   Venous Doppler of the left lower extremity shows extensive  calf/popliteal/femoral vein DVT of the left leg.    ASSESSMENT/PLAN:  1.  Extensive left leg deep venous thrombosis, in spite of therapeutic INR,      now causing increased swelling and pain of the affected leg. No clinical      evidence, however, of cellulitis. Will admit the patient, continue      anticoagulation, apply TED stockings, elevate the leg, administer      analgesics and for completeness do blood cultures. Will maintain a low      threshold for starting antibiotics should patient develop erythema      and/or fever.   1.  Intermittent shortness of breath, without chest pain. The patient      suggests anxiety attacks, however. Pulmonary embolism has to be ruled      out, against a background of extensive left leg deep venous thrombosis.      We shall therefore arrange for a chest CT angiogram. If this proves      positive, the patient may require IVC filter.   1.  History of depression/anxiety. We shall continue Wellbutrin XL in pre-      admission dosages and Ativan.   1.  Possible alcohol abuse. Should watch for alcohol withdrawal phenomena.   1.  History of hypertension. Blood pressure appears well controlled at      present. Will monitor closely.   1.  History of gastroesophageal reflux disease. Will manage with proton pump      inhibitor.  Further management will depend on clinical course.       CO/MEDQ  D:  07/14/2004  T:  07/14/2004  Job:  [id number]

## 2010-06-10 NOTE — Discharge Summary (Signed)
NAMEJACQUALIN, [name]               ACCOUNT NO.:  [id number]   MEDICAL RECORD NO.:  [id number]          PATIENT TYPE:  INP   LOCATION:  5527                         FACILITY:  [facility]   PHYSICIAN:  Rodnesha Elie Dictator       DATE OF BIRTH:  [birth year]   DATE OF ADMISSION:  06/19/2004  DATE OF DISCHARGE:  06/22/2004                                 DISCHARGE SUMMARY   DISCHARGE DIAGNOSES:  1.  Left lower extremity deep venous thrombosis without evidence of      pulmonary emboli.  2.  Suicidal ideation with severe depression.  3.  Hypertension.   DISCHARGE MEDICATIONS:  The patient is to be discharged on:  1.  Lovenox 50 mg subcutaneously q.12h. to be continued until a target INR      of between 2-3 is reached.  2.  Coumadin 5 mg p.o. daily until a target INR of between 2-3 is reached      and is to continue for three months after discharge from the hospital.  3.  Atenolol 50 mg p.o. daily.  4.  Lexapro 5 mg p.o. q.a.m. x 3 days and then increase to 10 mg p.o. q.a.m.  5.  Ativan 1 mg p.o. t.i.d.  6.  Protonix 40 mg p.o. daily.  7.  Multivitamins one p.o. daily.  8.  Amitriptyline 25 mg p.o. q.h.s.  9.  Trazodone 75 mg p.o. q.h.s.  10. K-Dur 40 mEq p.o. b.i.d. x 2 days.   CONSULTATIONS:  Psychiatry, [name], M.D.  Please see note and  consultation.   PROCEDURES:  Lower extremity Doppler ultrasound showing extensive DVT of the  left lower extremity.   BRIEF HOSPITAL COURSE:  #1 - DEEP VENOUS THROMBOSIS:  The patient had been  recently admitted to [facility].  She then had complained of  lower extremity pain.  She was evaluated in the emergency department and was  found to have extensive DVT in the distal calf of the left lower extremity  as evidenced by her ultrasound.  She was then admitted for treatment of  such.  The patient had also had no prior history of DVT and no family  history that she is aware of.  She is not a current smoker.  Therefore, the  patient  was started on Lovenox subcutaneous injections and oral Coumadin  therapy.  At the time of discharge, her INR is 1.2 and she will continue on  her Lovenox until she achieves a therapeutic between 2-3.  Following this  she will need a three-month treatment course for her DVT.  If this recurs,  of course she will need longer treatment and/or lifelong treatment and a  workup for hypercoagulable state, which has not been on this admission since  it is her first episode.   #2 - DEPRESSION AND SUICIDAL IDEATION:  As noted, the patient had been  recently admitted to [facility].  During this admission for  treatment for her DVT, we did obtain another psychiatric consult.  She was  seen and deemed by the psychiatrist, Dr. Jeanie Bauer, to have depression that  warranted inpatient treatment and he has recommended that she be transferred  back to [facility].   #3 - HYPERTENSION:  This has been well controlled on her beta blocker and  she has not had any problems during her hospital stay.   #4 - HYPOKALEMIA:  The patient was found to have a low potassium level, but  during her hospital stay has refused to take potassium supplements.  She  will be sent out with a prescription for potassium replacement, which she  should take for two days.  Prior to discharge, a repeat potassium level  should be checked.   CONDITION ON DISCHARGE:  At the time of discharge, her condition is  improved.  She is afebrile and vital signs are stable.  She does not  complain of any leg pain or shortness of breath.  She does have a flat  affect and appears to be depressed.  Her lungs are clear.  She has no calf  tenderness.   DISPOSITION:  The patient is to be transferred to the [facility] under the care of psychiatry.  Her treatment for her DVT will consist  of continuing her Lovenox as noted above, as well as her Coumadin, and  discontinuation of the Lovenox when her INR is  therapeutic.  She is to  continue her Coumadin for three months and follow up with her primary care  physician every one to two weeks initially and then every three to four  weeks to check a PT INR.      RLK/MEDQ  D:  06/22/2004  T:  06/22/2004  Job:  [id number]

## 2010-06-10 NOTE — Op Note (Signed)
[name], [name]               ACCOUNT NO.:  [id number]   MEDICAL RECORD NO.:  [id number]          PATIENT TYPE:  OIB   LOCATION:  5038                         FACILITY:  [facility]   PHYSICIAN:  Mark C. Ophelia Charter, M.D.    DATE OF BIRTH:  [birth year]   DATE OF PROCEDURE:  03/30/2004  DATE OF DISCHARGE:  03/31/2004                                 OPERATIVE REPORT   PREOPERATIVE DIAGNOSIS:  Right L5-S1 herniated nucleus pulposus.   POSTOPERATIVE DIAGNOSIS:  Right L5-S1 herniated nucleus pulposus.   PROCEDURE:  Right L5-S1 microdiskectomy.   SURGEON:  Mark C. Ophelia Charter, M.D.   ASSISTANTLynford Citizen, RNFA   ANESTHESIA:  GOT plus Marcaine locally.   ESTIMATED BLOOD LOSS:  Less than 100 mL.   DESCRIPTION OF PROCEDURE:  After induction of general anesthesia with  orotracheal intubation, preoperative Ancef prophylaxis, the patient was  intubated, placed in the prone position on the [place] frame with kneeling,  carefully padding, positioning.  The back was prepped with Duraprep.  Betadine Vi-drape applied after scoring the area with towels and laminectomy  sheets and drapes were applied.  Incision was made after needle localization  with the spinal needle, 18 gauge.  Cross-table lateral x-ray confirmed that  the needle was at the L5-S1 space.  Laminotomy was performed after self-  retaining retractor was placed and operating microscope was draped and  brought in.  Ligament thick chunks were removed, minimal amount of bone  needed to be removed cephalad and caudad, but some bone was overhanging  laterally which was removed, coming off the facet in this 65 year old  female.  Essentially, we removed the disk, was large, bulging, and was  partially sticking over the endplates.  After annulus incised, big chunks of  ligaments were removed.  Passes were made inside the disk space with Epstein  curets, up and down micropituitaries, regular pituitaries.  Foramen was  enlarged and once the disk  material had been removed, the nerve root was  well decompressed.  Hockey stick was passed anterior to the dura, 180 degree  sweep, no areas of compression.  Lateral bone n the gutter was smooth.  Foramen was free and foramen was palpated with the hockey stick with no  areas of compression.  After irrigation with saline solution, the deep  fascia was closed with 0 Vicryl, 2-0 Vicryl in subcutaneous tissue, skin  staple closure.      MCY/MEDQ  D:  04/22/2004  T:  04/22/2004  Job:  [id number]

## 2010-06-10 NOTE — H&P (Signed)
[name], [name]               ACCOUNT NO.:  [id number]   MEDICAL RECORD NO.:  [id number]          PATIENT TYPE:  INP   LOCATION:  1831                         FACILITY:  [facility]   PHYSICIAN:  Mobolaji B. Bakare, M.D.DATE OF BIRTH:  December 29, 1945   DATE OF ADMISSION:  06/19/2004  DATE OF DISCHARGE:                                HISTORY & PHYSICAL   CHIEF COMPLAINT:  Left lower extremity swelling and pain.   HISTORY OF PRESENTING COMPLAINT:  [name] is a 65 year old Caucasian lady  with recent hospitalization for attempted suicide with beta blocker,  hypertension, and severe depression. She was transferred to the [facility] two days ago. Today, she was evaluated and was found to have  left lower extremity swelling and pain. She had an ultrasound of the lower  extremity done in the emergency department which showed extensive DVT distal  calf in the posterior tibia vein, extending up through the common femoral  vein into the groin area. She does not have shortness of breath, chest pain,  or dyspnea on exertion. There has been no fever, chills, or cough.   She has never had a history of DVT. She states that her mother had blood  clot and was on blood thinners. She is not a current smoker and not on any  ammonia replacement therapy. She is post menopausal. She does not have any  vaginal bleeding or any breast lumps.   REVIEW OF SYSTEMS:  She is constipated, has insomnia. No headaches,  abdominal pain, dysuria, urgency, or change in frequency of micturition.   PAST MEDICAL HISTORY:  1.  Depression which necessitated inpatient treatment at [facility] several      years ago.  2.  Suicidal attempt.  3.  Recent hospitalization with suicidal attempt with beta blocker and      benzodiazepines complicated by hypertension and hyperglycemia.  4.  History of chronic sinusitis.  5.  History of hypertension.  6.  History of chronic low back pain. She had L5-S1 micro diskectomy  in      March 2006.  7.  She had hysteroscopy and D&C in 2001.  8.  History of termination of pregnancy at the age of 65 years.  9.  Gastroesophageal reflux disease.   CURRENT MEDICATIONS:  1.  Librium 25 mg p.o. q.6h. p.r.n.  2.  Trazodone 25 mg q.h.s. p.r.n. sleep.  3.  Acetaminophen 650 mg p.o. q.4h. p.r.n.  4.  Magnesium/aluminum hydroxide/simethicone 30 mL p.o. q.4h. p.r.n. for      indigestion.  5.  Magnesium oxide.  6.  Milk of magnesia 30 cc q.h.s. p.r.n.  7.  Dulcolax 5 mg p.o. q.h.s. p.r.n.  8.  Dulcolax suppository 10 mg p.r. q.h.s. p.r.n.  9.  Glycerine suppository one p.r. q.h.s. p.r.n.  10. Cepa col one tablet p.o. p.r.n. for sore throat.  11. Ativan 1mg  p.o t.i.d   ALLERGIES:  No known drug allergies.   SOCIAL HISTORY:  The patient is an ex-smoker. She quit 15 years. Probably  smoked for about 15 years, one pack per day. She denies alcohol abuse. She  used to live alone prior to current hospitalization.   FAMILY HISTORY:  She is a widow. Mother had history of blood clot and was on  anticoagulation. The patient cannot recall any illness in her father.  Husband passed away about 18 months ago and she has been severely depressed  since then. She has a daughter who lives in [place] and is supportive.   PHYSICAL EXAMINATION:  VITAL SIGNS: Temperature 99.2, blood pressure 150/82,  pulse 110, respiratory rate 20, saturation 97%.  GENERAL: On examination the patient is not in acute respiratory distress.  HEENT: Normocephalic and atraumatic head. Pupils equal, round, and reactive  to light. Extraocular movements intact. Not pale, anicteric. No elevated  JVD. No carotid bruits. No thyromegaly.  LUNGS: Clear clinically to auscultation. No wheeze and no rhonchi.  CVS: S1 and S2 regular. No murmurs, rubs, or gallops.  ABDOMEN: Distended, soft, no palpable organomegaly. Bowel sounds present.  RECTAL No mass felt anal tag in 6 oclock position. no blood.  EXTREMITIES:  Obvious swelling involving the left leg from groin to left foot  with mild tenderness and hyperemia. No inguinal lymphadenopathy palpable.  Dorsalis pedis pulse palpable.  CNS: No focal neurologic deficits.  SKIN: No rash. No petechia.  Breast Exam (Female chaperon present) : No abnormality   INITIAL LABORATORY DATA:  [name] done today.   Chest x-ray showed resolving right lower lobe infiltrates. No EKG available.   ASSESSMENT/PLAN:  [name] is a 65 year old Caucasian female with history  of suicidal attempt, severe depression with recent hospitalization, now  presenting with left lower extremity swelling and pain, found to have  extensive deep venous thrombosis.  Plan is to admit to telemetry. Lovenox 1  mg/kg b.i.d. subcutaneously, Coumadin as per pharmacy protocol, elevate left  lower extremities, continue to 24-hour sitter, Protonix 40 mg p.o. daily,  Tylenol 650 mg p.o. q.4h. p.r.n. Check PT-INR daily. CBC daily.  Hypercoagulable profile.  1.  Severe depression.  2.  History of anxiety.  3.  Suicide attempt. Will continue trazodone 25 mg p.o. q.h.s. p.r.n. sleep,      Librium 25 mg p.o. q.6h. p.r.n. (for withdrawal symptoms; the patient's      Xanax was stopped two weeks ago). Now on Ativan t.i.d  4.  Place a 24-hour sitter. She may need re-evaluation by Dr. Jeanie Sewer      during this hospitalization regarding treatment for severe depression.  5.  Hypertension. The patient's medication is currently on hold. We will      resume antihypertensive Atenolo 50 mg      p.o. daily.  6.  Check EKG and check BMET.  7.  Sinus Tachycardia -Do chest CT angiogram to rule out P.E. This maybe      from beta blocker withdrawal .      MBB/MEDQ  D:  06/19/2004  T:  06/19/2004  Job:  [id number]   cc:   Gloriajean Dell. Andrey Campanile, M.D.  [address]  [place]  [place] [address]  Fax: [phone]   Geoffery Lyons, M.D.
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
## 2010-06-10 NOTE — H&P (Signed)
[name], [name]               ACCOUNT NO.:  [id number]   MEDICAL RECORD NO.:  [id number]          PATIENT TYPE:  INP   LOCATION:                               FACILITY:  [facility]   PHYSICIAN:  Elliot Cousin, M.D.    DATE OF BIRTH:  04/21/1945   DATE OF ADMISSION:  06/17/2004  DATE OF DISCHARGE:                                HISTORY & PHYSICAL   PRIMARY CARE PHYSICIAN:  Dr. Benedetto Goad   CHIEF COMPLAINT:  Depression, I just wanted to go to sleep.   HISTORY OF PRESENT ILLNESS:  The patient is a 65 year old lady with a past  medical history significant for depression, inpatient admission at [facility] in 1989 secondary to depression and a suicide attempt,  hypertension, and chronic low back pain who presents to the emergency  department with an apparent intentional drug overdose with atenolol.  The  patient states that she has been depressed and lonely for the past few  years; however, the depression and loneliness worsened over the past few  weeks.  The patient says that the depression in part is secondary to chronic  low back pain.  The patient is status post L5-S1 microdiskectomy in March of  2006.  The patient states that she had been taking chronic pain medication,  but has stopped.  The patient wanted something for her pain, but apparently  was not able to obtain pain medication from her orthopedic physician.  On  last night the patient decided to take 30 pills of atenolol and six  Risperdal tablets.  She states that she wanted to go to sleep.  When I  asked the patient if she wanted to go to sleep permanently she nodded yes.  The patient apparently counted each atenolol pill as she swallowed them.  She also states that she had taken extra doses of lorazepam and  promethazine; however, she could not quantify.  The patient is unsure about  how many of pills she took in addition to Risperdal, promethazine, and  atenolol.  She became frightened and called 911.  911  eventually dispatched  EMS out to her residence.  When EMS arrived the patient was lethargic, but  was able to follow commands.  She apparently had some inappropriate  responses.  When the patient arrived to the emergency department her  systolic blood pressure was 70.  The patient was given a bolus of IV fluids  and her blood pressure increased to 100/59.  Her EKG revealed normal sinus  rhythm with a heart rate of 70 beats per minute.  Her urine drug screen was  positive for opiates and barbiturates.  It is important to note that the EMS  did give the patient 2 mg of Narcan.   The patient will be admitted for further evaluation and management.   PAST MEDICAL HISTORY:  1.  Depression with a history of inpatient treatment at [facility] for depression and suicide attempt.  2.  Hypertension.  3.  Chronic sinusitis.  4.  Chronic low back pain status post right  L5-S1 microdiskectomy per Dr.      Ophelia Charter March of 2006.  5.  Status post hysteroscopy with a D&C in March of 2001.  6.  Gastroesophageal reflux disease.   MEDICATIONS:  (The patient does not take all of the following medications  consistently.)  1.  Nexium 40 mg daily.  2.  Amoxicillin 500 mg two tablets daily.  The patient recently refilled      that prescription from last year secondary to a self diagnosis of      sinusitis.  3.  Lorazepam 1 mg b.i.d.  4.  Risperdal 2 mg daily.  5.  Nortriptyline 25 mg daily.  6.  Atenolol 50 mg daily.  7.  Promethazine hydrochloride 25 mg daily.  8.  Mirtazapine 30 mg daily.   ALLERGIES:  No known drug allergies.   SOCIAL HISTORY:  The patient is widowed.  [place]  She has one daughter who lives in [place].  She denies  tobacco, drug, and alcohol use.  She considers herself disabled.   FAMILY HISTORY:  Her mother died of complications secondary to diabetes  mellitus at 22 years of age.  Her father died at 69 years of age secondary   to complications from alcohol abuse.   REVIEW OF SYSTEMS:  The patient's review of systems is positive for  depression, anxiety, chronic low back pain with radiation to the right leg  and occasional numbness and weakness, heartburn, sinus congestion  occasionally, intermittent abdominal pain, occasional swelling in her legs.  Otherwise, her review of systems is negative.   PHYSICAL EXAMINATION:  VITAL SIGNS:  Temperature 98.1, systolic blood  pressure 70, repeated blood pressure 100/59, pulse 68, respiratory rate 30,  repeated at 14, oxygen saturation 99% on 2 L.  GENERAL:  The patient is a mildly lethargic 65 year old Caucasian lady who  is currently lying in bed in no acute distress.  HEENT:  Head is normocephalic, atraumatic.  Pupils are equal, round, and  reactive to light.  Extraocular movements are intact.  Conjunctivae are  clear.  Sclerae are white.  Tympanic membranes are clear bilaterally.  Nasal  mucosa is dry.  No drainage.  No maxillary sinus tenderness.  Oropharynx  reveals charcoal tinged mucous membranes.  Mucous membranes are dry.  No  posterior exudates or erythema.  Upper dentures present.  NECK:  Supple.  No adenopathy.  No thyromegaly.  No bruit.  No JVD.  LUNGS:  Clear to auscultation bilaterally, although there are decreased  breath sounds in the bases.  HEART:  S1, S2 with a soft systolic murmur.  ABDOMEN:  Hypoactive bowel sounds, soft, mildly tender in the epigastrium,  nondistended.  No hepatosplenomegaly.  No masses palpated.  GENITOURINARY:  Deferred.  RECTAL:  Deferred.  EXTREMITIES:  Pedal pulses 1+ bilaterally.  No pretibial edema.  No pedal  edema.  The patient was able to raise her legs off of the bed at  approximately 30 degrees bilaterally without any discomfort.  She has a good  range of motion of her upper extremities.  BACK:  There is a well healed lumbar scar.  Mild tenderness to palpation of the lumbosacral muscles.  No spasm.  No erythema.  No  edema.  NEUROLOGIC/PSYCHOLOGIC:  The patient is lethargic, but readily alert when  continually stimulated.  The patient is oriented to the hospital, but not to  the year or to the day or to the date.  She is oriented to the President.  She has some difficulty with memory, although when asked questions  repeatedly, the patient appears to understand the question much better and  appears to answer the questions more appropriately.  The patient's affect is  sad and somewhat distressed.  The patient readily admits to being depressed  and seeking help because of depression.  She again stated that she just  wanted to sleep; however, she agreed that she wanted to sleep permanently.  The patient has a daughter, but feels that she does not have much support  from her family.  The patient is, in part, depressed about chronic back pain  and her quality of living with the back pain.  The patient's cranial nerves  II-XII are otherwise intact.   ADMISSION LABORATORIES:  EKG revealed a normal sinus rhythm with a heart  rate of 70 beats per minute.  Acetaminophen level 10.2.  Salicylate level  less than 4.  Alcohol less than 5.  Urine drug screen positive for opiates,  positive for barbiturates.  CBC:  WBC 10.1, hemoglobin 10.9, hematocrit  31.1, platelets 319.  Creatinine 0.9, sodium 138, potassium 3.1, chloride  102, BUN 15, glucose 212, bicarbonate 23.7.   ASSESSMENT:  1.  Intentional drug overdose/suicide attempt/depression.  The patient may      have taken multiple medications; however, the primary drug of choice was      atenolol.  The patient apparently took 30,  50 mg atenolol tablets last      night.  She was hypotensive on arrival to the emergency department;      however, her blood pressure has improved with intravenous fluids and      Glucagon treatment.  2.  Urine drug screen positive for opiates and barbiturates.  Etiology      unclear.  However, the patient did receive Narcan by the EMS  technician.  3.  Hypokalemia.  The etiology is unclear.  The patient does not appear to      be on chronic diuretic therapy.  Will need to rule out magnesium      deficiency.  4.  Hyperglycemia.  The patient's blood glucose is 212.  The elevated      glucose may be secondary to the Glucagon.  5.  Normocytic anemia.  Will need to rule out iron deficiency anemia.   PLAN:  1.  The patient received a 1 L bolus of normal saline as prescribed by the      emergency department physician, Dr. Read Drivers.  The patient was also      started on a Glucagon infusion.  She has received approximately 20 mg of      Glucagon while being treated in the ED.  Her blood pressure has ranged      between 94 and 99 systolically.  Her heart rate has ranged between 70      and 80 beats per minute.  2.  Will continue the Glucagon infusion and decrease the rate incrementally      over the next 12 hours. 3.  Consider epinephrine and/or dopamine infusion if the patient's blood      pressure is not maintained above 90 systolically.  4.  Will add a stat tricyclic antidepressant drug screen.  5.  Will assess the patient's CMET as well as magnesium level.  6.  Will replete the patient's potassium chloride p.o. and IV.  7.  Neurologic checks q.4h. x24 hours.  The patient will be n.p.o. until she      is  completely alert and oriented.  8.  Will check iron studies as well as a TSH.  9.  Will repeat an acetaminophen level.  10. Will cover elevated blood sugars with sliding scale insulin regimen.  11. Nasonex nasal spray and Claritin as needed for allergies/sinusitis      symptoms.  No antibiotic needed at this time.  12. Will repeat an EKG now and in the a.m.  The patient will be admitted to      a stepdown bed with a 24-hour sitter.  Will consult psychiatry.       DF/MEDQ  D:  06/17/2004  T:  06/17/2004  Job:  [id number]   cc:   Gloriajean Dell. Andrey Campanile, M.D.  [id number]    [place] [address]  Fax: [phone]

## 2010-06-10 NOTE — Op Note (Signed)
[facility]  Patient:    [name], [name]                      MRN: [id number] Proc. Date: 04/12/99 Adm. Date:  [id number] Disc. Date: [id number] Attending:  Michaele Offer                           Operative Report  PREOPERATIVE DIAGNOSIS:  Thickened endometrium.  POSTOPERATIVE DIAGNOSIS:  Abnormal uterine anatomy.  PROCEDURE:  Hysteroscopy with dilation and curettage.  SURGEON:  Zenaida Niece, M.D.  ANESTHESIA:  General with an LMA, paracervical block.  ESTIMATED BLOOD LOSS:  Less than 50 cc.  FINDINGS:  Abnormal endometrial cavity that appeared to have 4 symmetrical indentations horizontally across the uterine fundus similar to 4 tubal ostia. There were no endometrial lesions and an atrial thick endometrial lining.  COUNTS:  Correct.  CONDITION:  Stable.  DESCRIPTION OF PROCEDURE:  After appropriate informed consent was obtained, the patient was taken to the operating room and placed in the dorsal supine position. General anesthesia was induced and she was placed in low stirrups. Her perineum and vagina were then prepped and draped in the usual sterile fashion for a hysteroscopic procedure. Her bladder was drained a red rubber catheter. A Graves speculum was inserted into the vagina and the anterior lip of the cervix grasped with a single tooth tenaculum. Paracervical block was then performed with 1% lidocaine. The uterus then sounded to approximately 8 cm. The cervix was gradually dilated to a size 23 Hegar dilator without difficulty. The observer hysteroscope was then inserted and adequate visualization was achieved. The above mentioned findings were noted. Four symmetrical indentations which appeared to be tubal ostia. There were no identifiable lesions and an atrophic endometrium. The hysteroscope was removed and sharp curettage performed with minimal tissue retrieved. The single tooth tenaculum was then removed and bleeding  controlled with pressure. The Grave speculum was removed. The patient was awakened in the operating room and taken to the recovery room in stable condition after tolerating the procedure well. DD:  07/13/99 TD:  07/15/99 Job: [id number]

## 2010-06-10 NOTE — Discharge Summary (Signed)
NAMEBURNELL, MATLIN               ACCOUNT NO.:  [id number]   MEDICAL RECORD NO.:  [id number]          PATIENT TYPE:  IPS   LOCATION:  0302                          FACILITY:  [facility]   PHYSICIAN:  Geoffery Lyons, M.D.      DATE OF BIRTH:  [birth year]   DATE OF ADMISSION:  06/18/2004  DATE OF DISCHARGE:  06/19/2004                                 DISCHARGE SUMMARY   TIME OF DISCHARGE:  10:30 a.m.   HISTORY OF PRESENT ILLNESS:  This 65 year old widowed white female was a  voluntary admission on Jun 18, 2004 after a 2-day stay over on the internal  medicine service after she took and intentional overdose of atenolol 50 mg,  approximately 30 tablets, along with a questionable amount of 6 tablets of  Risperdal 1 mg.  She reports that she counted each pill as she swallowed  them.  She called EMS herself regretting her overdose after she took it.  EMS found her responsive but lethargic and inappropriate.  She was taken to  the emergency room and subsequently admitted.  Patient herself endorses  depression that has been increasing since her husband died 18 months ago and  worsened after she was not able to work because of back problems following a  work injury in January and subsequent back surgery Dr. Ophelia Charter in March 2006.  Patient felt suicidal and was hospitalized at [facility] on May 14 after a  previous overdose which was apparently her first suicide attempt.  The  nature of that overdose is unclear.  She does have 1 daughter who had  reported to the assessment team that her mother lays around in bed all the  time and is so depressed she is unable to leave the house.   PAST PSYCHIATRIC HISTORY:  The patient has been treated only by her primary  care physician in the past, Dr. Benedetto Goad at [facility].  This is the patient's first admission to [facility].  She has a prior admission to [facility] in May,  approximately Jun 05, 2004 for 3  days, per her own report.  She also reports  a distant history of hospitalization at [facility] in 1989 for  depression.   SOCIAL HISTORY:  This is a widowed white female who lives in [place]  alone in her own home.  No current legal problems.  She has one grown  daughter here in town.  Widowed approximately 18 months ago.  She has been  working for a [employer] up until January when she had a work  injury to her back.   MEDICAL HISTORY:  1.  The patient's medical history was remarkable for the beta-blocker      overdose.  2.  Hypertension.  3.  Chronic low back pain.   PAST MEDICAL HISTORY:  Remarkable for an L5-S1 micro diskectomy in March  2006.  She is also status post hysterectomy, chronic sinusitis and GERD.   MEDICATIONS PRIOR TO ADMISSION:  1.  Xanax 1 mg 4-5 times daily.  Apparently she has  been taking Xanax for      approximately 17 years but no evidence that she has abused it.  2.  Nortriptyline at bedtime, dose unclear.  3.  In the past she has also taken Effexor, Nasonex, Claritin, Remeron 30 mg      at h.s., Risperdal 2 mg daily and lorazepam 1 mg b.i.d. more recently.  4.  She took atenolol 50 mg daily for hypertension.  Apparently the Remeron and Risperdal were prescribed at [facility]; how regularly she has been taking it is not clear.   DRUG ALLERGIES:  None.   MENTAL STATUS EXAM:  At the time of admission patient is fully alert,  tearful with an anxious affect but appropriate.  Primarily anxious and  concerned about the condition of her left leg stating that it hurt, it  ached, and this was a new swelling in the leg.  Speech was normal in pace,  tone and amount.  Mood was obviously depressed with some guarding.  Thought  process was logical and coherent.  Thoughts were guarded.  Gives clear  history of psychomotor agitation episodically, clear suicidal thoughts  wishing that she was still dead and wishing that she had  succeeded at her  suicide attempts, no homicidal thought.  Cognitively she is intact and  oriented x 3.   HOSPITAL COURSE:  At the time of admission, physical examination revealed  that the patient's left leg was approximately swollen an inch and a half  larger than the right leg.  Cool to the touch with 2-3+ edema, bluish in  color with faint pedal pulses.  The patient was found with the left leg  swelling, and the condition of that.  The patient was rerouted back to [facility] to be evaluated by the internal medicine team to rule out deep  venous thrombosis, which was confirmed, and patient was readmitted to the  medicine service.   DISCHARGE DIAGNOSES:   AXIS I:  Major depression, recurrent, severe, rule out psychosis.   AXIS II:  Deferred.   AXIS III:  1.  Status post beta-blocker overdose.  2.  Rule out deep venous thrombosis to left leg.  3.  Status post hypokalemia.  4.  Normocytic anemia.  5.  Gastroesophageal reflux disease.  6.  Hypertension.  7.  Elevated glucose probably secondary to Glucagon therapy for the beta-      blocker overdose.   AXIS IV:  Severe isolation and grief after death of her husband.   AXIS V:  Current 20-25, past year 55-60 estimated.   STATUS AT DISCHARGE:  Patient is transferred for admission to [facility] for suspect DVT.      MAS/MEDQ  D:  07/01/2004  T:  07/01/2004  Job:  [id number]

## 2010-06-10 NOTE — Discharge Summary (Signed)
NAMECARLYON, [name]               ACCOUNT NO.:  [id number]   MEDICAL RECORD NO.:  [id number]          PATIENT TYPE:  INP   LOCATION:  A305                          FACILITY:  [facility]   PHYSICIAN:  [name] D. [name], MD   DATE OF BIRTH:  [birth year]   DATE OF ADMISSION:  09/03/2008  DATE OF DISCHARGE:  08/16/2010LH                               DISCHARGE SUMMARY   DISCHARGE DIAGNOSES:  1. Noncardiac chest pain.  2. Diabetes mellitus.  3. Gastroesophageal reflux disease.  4. Recurrent deep venous thrombosis.  5. Diarrhea.  6. History of depression disorder.  7. History of psychosis.  8. History of diskogenic disease.   DISCHARGE MEDICATIONS:  1. Actos 45 mg p.o. daily.  2. Glipizide 10 mg daily.  3. Mirtazapine 45 mg at bedtime.  4. Cymbalta 40 mg daily.  5. Risperdal 1 mg daily.  6. Reglan 5 mg p.o. at bedtime.  7. Protonix 40 mg daily.  8. Metoprolol 25 mg daily.  9. Ambien 5 mg daily.  10.Accu-Cheks according to sliding scale.  11.Clonazepam 0.5 mg at bedtime.  12.Metformin 500 mg b.i.d.  13.Lantus insulin 10 units subcu daily.   DISPOSITION:  The patient is discharged to home in stable condition.   HOSPITAL COURSE:  This is a 65 year old white female patient with  history of multiple medical illnesses, who was admitted from nursing  home due to chest pain.  She was evaluated by cardiologist.  Her chest  pain was found to be noncardiac.  The patient improved and she was  discharged back to be followed in rest home.      [name] D. [name], MD  Electronically Signed     TDF/MEDQ  D:  09/24/2008  T:  09/24/2008  Job:  [id number]

## 2010-06-16 ENCOUNTER — Ambulatory Visit (INDEPENDENT_AMBULATORY_CARE_PROVIDER_SITE_OTHER): Payer: Medicare Other | Admitting: *Deleted

## 2010-06-16 DIAGNOSIS — Z8679 Personal history of other diseases of the circulatory system: Secondary | ICD-10-CM

## 2010-06-16 DIAGNOSIS — I82409 Acute embolism and thrombosis of unspecified deep veins of unspecified lower extremity: Secondary | ICD-10-CM

## 2010-06-16 DIAGNOSIS — Z7901 Long term (current) use of anticoagulants: Secondary | ICD-10-CM

## 2010-06-16 LAB — POCT INR: INR: 2.7

## 2010-07-14 ENCOUNTER — Ambulatory Visit (INDEPENDENT_AMBULATORY_CARE_PROVIDER_SITE_OTHER): Payer: Medicare Other | Admitting: *Deleted

## 2010-07-14 DIAGNOSIS — Z7901 Long term (current) use of anticoagulants: Secondary | ICD-10-CM

## 2010-07-14 DIAGNOSIS — Z8679 Personal history of other diseases of the circulatory system: Secondary | ICD-10-CM

## 2010-07-14 DIAGNOSIS — I82409 Acute embolism and thrombosis of unspecified deep veins of unspecified lower extremity: Secondary | ICD-10-CM

## 2010-07-14 LAB — POCT INR: INR: 2.5

## 2010-08-11 ENCOUNTER — Ambulatory Visit (INDEPENDENT_AMBULATORY_CARE_PROVIDER_SITE_OTHER): Payer: Medicare Other | Admitting: *Deleted

## 2010-08-11 DIAGNOSIS — Z7901 Long term (current) use of anticoagulants: Secondary | ICD-10-CM

## 2010-08-11 DIAGNOSIS — Z8679 Personal history of other diseases of the circulatory system: Secondary | ICD-10-CM

## 2010-08-11 DIAGNOSIS — I82409 Acute embolism and thrombosis of unspecified deep veins of unspecified lower extremity: Secondary | ICD-10-CM

## 2010-08-11 LAB — POCT INR: INR: 2.8

## 2010-08-23 ENCOUNTER — Emergency Department (HOSPITAL_COMMUNITY): Payer: Medicare Other

## 2010-08-23 ENCOUNTER — Emergency Department (HOSPITAL_COMMUNITY)
Admission: EM | Admit: 2010-08-23 | Discharge: 2010-08-23 | Disposition: A | Discharge status: Home / Self Care | Payer: Medicare Other | Attending: Emergency Medicine | Admitting: Emergency Medicine

## 2010-08-23 ENCOUNTER — Encounter: Payer: Self-pay | Admitting: Emergency Medicine

## 2010-08-23 DIAGNOSIS — IMO0002 Reserved for concepts with insufficient information to code with codable children: Secondary | ICD-10-CM | POA: Insufficient documentation

## 2010-08-23 DIAGNOSIS — Y9210 Unspecified residential institution as the place of occurrence of the external cause: Secondary | ICD-10-CM | POA: Insufficient documentation

## 2010-08-23 DIAGNOSIS — S9000XA Contusion of unspecified ankle, initial encounter: Secondary | ICD-10-CM | POA: Insufficient documentation

## 2010-08-23 DIAGNOSIS — T148XXA Other injury of unspecified body region, initial encounter: Secondary | ICD-10-CM

## 2010-08-23 DIAGNOSIS — Z79899 Other long term (current) drug therapy: Secondary | ICD-10-CM | POA: Insufficient documentation

## 2010-08-23 HISTORY — DX: Major depressive disorder, single episode, unspecified: F32.9

## 2010-08-23 HISTORY — DX: Depression, unspecified: F32.A

## 2010-08-23 HISTORY — DX: Hemiplegia, unspecified affecting unspecified side: G81.90

## 2010-08-23 HISTORY — DX: Cerebral infarction, unspecified: I63.9

## 2010-08-23 HISTORY — DX: Anxiety disorder, unspecified: F41.9

## 2010-08-23 HISTORY — DX: Gastro-esophageal reflux disease without esophagitis: K21.9

## 2010-08-23 NOTE — ED Notes (Signed)
C/o left lateral ankle pain; states another resident of [facility] fell on her, landing on her left ankle; pt reports hx of CVA affecting left side, and states she is ambulatory with assistance; able to bear weight and transfer from wheelchair to stretcher with assistance.  In no apparent distress.

## 2010-08-23 NOTE — ED Notes (Signed)
ASO applied left ankle; ice pack given to apply to left ankle. [facility] contacted to transport pt home.

## 2010-08-23 NOTE — ED Notes (Signed)
Pt states another resident at a nursing home facility fell on her left ankle. Pt c/o left ankle pain.

## 2010-08-23 NOTE — ED Notes (Signed)
Left in c/o [facility] staff for transport back to facility; in no distress

## 2010-08-31 NOTE — ED Provider Notes (Signed)
History     CSN: [id number] Arrival date & time: 08/23/2010  2:41 PM  Chief Complaint  Patient presents with  . Ankle Pain   Patient is a 65 y.o. female presenting with ankle pain. The history is provided by the patient.  Ankle Pain  The incident occurred 6 to 12 hours ago. The incident occurred at a nursing home. Injury mechanism: Another patient tripped and fell across her foot and ankle. The pain is present in the left foot. The pain is at a severity of 5/10. The pain is moderate. The pain has been constant since onset. Pertinent negatives include no numbness and no loss of sensation. Associated symptoms comments: Patient is wheelchair bound secondary to cva with left lower extremity weakness.  Does not weight bear.. The symptoms are aggravated by palpation. She has tried elevation for the symptoms.    Past Medical History  Diagnosis Date  . DVT (deep venous thrombosis)   . Diabetes mellitus   . Anxiety   . Depression   . Hemiplegia   . GERD (gastroesophageal reflux disease)   . Stroke     Past Surgical History  Procedure Date  . Back surgery     History reviewed. No pertinent family history.  History  Substance Use Topics  . Smoking status: Not on file  . Smokeless tobacco: Not on file  . Alcohol Use: No    OB History    Grav Para Term Preterm Abortions TAB SAB Ect Mult Living                  Review of Systems  Neurological: Negative for numbness.  All other systems reviewed and are negative.    Physical Exam  BP 125/69  Pulse 102  Temp(Src) 97.4 F (36.3 C) (Oral)  Resp 20  Ht 5\' 6"  (1.676 m)  Wt 170 lb (77.111 kg)  BMI 27.44 kg/m2  SpO2 96%  Physical Exam  Vitals reviewed. Constitutional: She is oriented to person, place, and time. She appears well-developed and well-nourished.  HENT:  Head: Normocephalic and atraumatic.  Eyes: Conjunctivae are normal.  Neck: Normal range of motion.  Cardiovascular: Normal rate and intact distal pulses.     Pulmonary/Chest: Effort normal.  Abdominal: Bowel sounds are normal.  Musculoskeletal: Normal range of motion. She exhibits tenderness. She exhibits no edema.       Left foot: She exhibits tenderness. She exhibits no swelling, normal capillary refill and no deformity.       Feet:  Neurological: She is alert and oriented to person, place, and time.  Skin: Skin is warm and dry.  Psychiatric: She has a normal mood and affect.    ED Course  Procedures  MDM Films reviewed - no pain at lateral malleolus.  Patient tender across proximal foot dorsum only.  No acute injury suspected at site identified by films.   Dg Ankle Complete Left  08/23/2010  *RADIOLOGY REPORT*  Clinical Data: Lateral left ankle pain post injury, woman fell on her left ankle, history of stroke affecting left side  LEFT ANKLE COMPLETE - 3+ VIEW  Comparison: None  Findings: Severe osseous demineralization. Mild soft tissue swelling left lower leg and ankle particularly laterally. Ankle mortise intact. Questionable focus of cortical disruption at lateral malleolus, nondisplaced fracture not excluded. No additional focal bony abnormalities identified. Plantar and Achilles insertion calcaneal spur formation. No other soft tissue abnormalities identified.  IMPRESSION: Severe osseous demineralization. Cannot exclude nondisplaced fracture at tip of lateral malleolus. Calcaneal spurring  as above.  Original Report Authenticated By: Lollie Marrow, M.D.     Medical screening examination/treatment/procedure(s) were performed by non-physician practitioner and as supervising physician I was immediately available for consultation/collaboration. Osvaldo Human, M.D.         Candis Musa, PA 08/31/10 1053  Carleene Cooper III, MD 08/31/10 [phone]

## 2010-09-08 ENCOUNTER — Ambulatory Visit (INDEPENDENT_AMBULATORY_CARE_PROVIDER_SITE_OTHER): Payer: Medicare Other | Admitting: *Deleted

## 2010-09-08 DIAGNOSIS — I82409 Acute embolism and thrombosis of unspecified deep veins of unspecified lower extremity: Secondary | ICD-10-CM

## 2010-09-08 DIAGNOSIS — Z7901 Long term (current) use of anticoagulants: Secondary | ICD-10-CM

## 2010-09-08 DIAGNOSIS — Z8679 Personal history of other diseases of the circulatory system: Secondary | ICD-10-CM

## 2010-09-08 LAB — POCT INR: INR: 2.8

## 2010-09-17 ENCOUNTER — Encounter (HOSPITAL_COMMUNITY): Payer: Self-pay | Admitting: *Deleted

## 2010-09-17 ENCOUNTER — Other Ambulatory Visit: Payer: Self-pay

## 2010-09-17 ENCOUNTER — Emergency Department (HOSPITAL_COMMUNITY): Payer: Medicare Other

## 2010-09-17 ENCOUNTER — Inpatient Hospital Stay (HOSPITAL_COMMUNITY)
Admission: EM | Admit: 2010-09-17 | Discharge: 2010-09-23 | DRG: 194 | Disposition: A | Discharge status: Nursing Home (Includes ICF) | Payer: Medicare Other | Attending: Internal Medicine | Admitting: Internal Medicine

## 2010-09-17 DIAGNOSIS — K219 Gastro-esophageal reflux disease without esophagitis: Secondary | ICD-10-CM | POA: Diagnosis present

## 2010-09-17 DIAGNOSIS — F341 Dysthymic disorder: Secondary | ICD-10-CM | POA: Diagnosis present

## 2010-09-17 DIAGNOSIS — E119 Type 2 diabetes mellitus without complications: Secondary | ICD-10-CM | POA: Diagnosis present

## 2010-09-17 DIAGNOSIS — Z7901 Long term (current) use of anticoagulants: Secondary | ICD-10-CM

## 2010-09-17 DIAGNOSIS — J189 Pneumonia, unspecified organism: Principal | ICD-10-CM | POA: Diagnosis present

## 2010-09-17 DIAGNOSIS — M519 Unspecified thoracic, thoracolumbar and lumbosacral intervertebral disc disorder: Secondary | ICD-10-CM | POA: Diagnosis present

## 2010-09-17 DIAGNOSIS — I69959 Hemiplegia and hemiparesis following unspecified cerebrovascular disease affecting unspecified side: Secondary | ICD-10-CM

## 2010-09-17 DIAGNOSIS — Z86718 Personal history of other venous thrombosis and embolism: Secondary | ICD-10-CM

## 2010-09-17 HISTORY — DX: Unspecified osteoarthritis, unspecified site: M19.90

## 2010-09-17 LAB — URINALYSIS, ROUTINE W REFLEX MICROSCOPIC
Bilirubin Urine: NEGATIVE
Glucose, UA: 500 mg/dL — AB
Hgb urine dipstick: NEGATIVE
Leukocytes, UA: NEGATIVE
Nitrite: NEGATIVE
Protein, ur: NEGATIVE mg/dL
Specific Gravity, Urine: 1.015 (ref 1.005–1.030)
Urobilinogen, UA: 0.2 mg/dL (ref 0.0–1.0)
pH: 8 (ref 5.0–8.0)

## 2010-09-17 LAB — DIFFERENTIAL
Basophils Absolute: 0 10*3/uL (ref 0.0–0.1)
Basophils Relative: 0 % (ref 0–1)
Eosinophils Absolute: 0 10*3/uL (ref 0.0–0.7)
Eosinophils Relative: 0 % (ref 0–5)
Lymphocytes Relative: 9 % — ABNORMAL LOW (ref 12–46)
Lymphs Abs: 1.4 10*3/uL (ref 0.7–4.0)
Monocytes Absolute: 0.5 10*3/uL (ref 0.1–1.0)
Monocytes Relative: 3 % (ref 3–12)
Neutro Abs: 13.2 10*3/uL — ABNORMAL HIGH (ref 1.7–7.7)
Neutrophils Relative %: 88 % — ABNORMAL HIGH (ref 43–77)

## 2010-09-17 LAB — GLUCOSE, CAPILLARY
Glucose-Capillary: 226 mg/dL — ABNORMAL HIGH (ref 70–99)
Glucose-Capillary: 131 mg/dL — ABNORMAL HIGH (ref 70–99)
Glucose-Capillary: 175 mg/dL — ABNORMAL HIGH (ref 70–99)

## 2010-09-17 LAB — BLOOD GAS, ARTERIAL
Acid-Base Excess: 0.2 mmol/L (ref 0.0–2.0)
Bicarbonate: 23.5 mEq/L (ref 20.0–24.0)
O2 Content: 2 L/min
O2 Saturation: 95.2 %
Patient temperature: 37
pCO2 arterial: 33 mmHg — ABNORMAL LOW (ref 35.0–45.0)
pH, Arterial: 7.466 — ABNORMAL HIGH (ref 7.350–7.400)
pO2, Arterial: 72.6 mmHg — ABNORMAL LOW (ref 80.0–100.0)

## 2010-09-17 LAB — CBC
HCT: 37.2 % (ref 36.0–46.0)
Hemoglobin: 12.3 g/dL (ref 12.0–15.0)
MCH: 27.2 pg (ref 26.0–34.0)
MCHC: 33.1 g/dL (ref 30.0–36.0)
MCV: 82.3 fL (ref 78.0–100.0)
Platelets: 364 10*3/uL (ref 150–400)
RBC: 4.52 MIL/uL (ref 3.87–5.11)
RDW: 14.1 % (ref 11.5–15.5)
WBC: 15.1 10*3/uL — ABNORMAL HIGH (ref 4.0–10.5)

## 2010-09-17 LAB — BASIC METABOLIC PANEL
BUN: 11 mg/dL (ref 6–23)
CO2: 25 mEq/L (ref 19–32)
Calcium: 9 mg/dL (ref 8.4–10.5)
Chloride: 89 mEq/L — ABNORMAL LOW (ref 96–112)
Creatinine, Ser: 0.63 mg/dL (ref 0.50–1.10)
GFR calc Af Amer: >60 mL/min (ref >60)
GFR calc non Af Amer: >60 mL/min (ref >60)
Glucose, Bld: 231 mg/dL — ABNORMAL HIGH (ref 70–99)
Potassium: 4 mEq/L (ref 3.5–5.1)
Sodium: 128 mEq/L — ABNORMAL LOW (ref 135–145)

## 2010-09-17 LAB — PROTIME-INR
INR: 1.69 — ABNORMAL HIGH (ref 0.00–1.49)
Prothrombin Time: 20.2 seconds — ABNORMAL HIGH (ref 11.6–15.2)

## 2010-09-17 LAB — POCT I-STAT TROPONIN I: Troponin i, poc: 0.02 ng/mL (ref 0.00–0.08)

## 2010-09-17 MED ORDER — DULOXETINE HCL 60 MG PO CPEP
120.0000 mg | ORAL_CAPSULE | Freq: Every day | ORAL | Status: DC
  Administered 2010-09-17 – 2010-09-23 (×7): 120 mg via ORAL
  Filled 2010-09-17 (×2): qty 2
  Filled 2010-09-17: qty 1
  Filled 2010-09-17 (×4): qty 2

## 2010-09-17 MED ORDER — PIPERACILLIN-TAZOBACTAM 3.375 G IVPB
3.3750 g | Freq: Three times a day (TID) | INTRAVENOUS | Status: DC
  Administered 2010-09-17 – 2010-09-23 (×18): 3.375 g via INTRAVENOUS
  Filled 2010-09-17 (×28): qty 50

## 2010-09-17 MED ORDER — POLYETHYLENE GLYCOL 3350 17 G PO PACK
17.0000 g | PACK | Freq: Every day | ORAL | Status: DC
  Administered 2010-09-17 – 2010-09-18 (×2): 17 g via ORAL
  Administered 2010-09-19 – 2010-09-20 (×2): via ORAL
  Filled 2010-09-17 (×6): qty 1

## 2010-09-17 MED ORDER — SODIUM CHLORIDE 0.9 % IV SOLN
INTRAVENOUS | Status: DC
  Administered 2010-09-17: 10:00:00 via INTRAVENOUS

## 2010-09-17 MED ORDER — PANTOPRAZOLE SODIUM 40 MG PO TBEC
40.0000 mg | DELAYED_RELEASE_TABLET | Freq: Every day | ORAL | Status: DC
  Administered 2010-09-17 – 2010-09-23 (×8): 40 mg via ORAL
  Filled 2010-09-17 (×6): qty 1

## 2010-09-17 MED ORDER — ACETAMINOPHEN 650 MG RE SUPP: 650.0000 mg | Freq: Four times a day (QID) | RECTAL | Status: DC | PRN

## 2010-09-17 MED ORDER — METOPROLOL SUCCINATE ER 25 MG PO TB24
25.0000 mg | ORAL_TABLET | Freq: Every day | ORAL | Status: DC
  Administered 2010-09-17 – 2010-09-23 (×7): 25 mg via ORAL
  Filled 2010-09-17 (×7): qty 1

## 2010-09-17 MED ORDER — PIPERACILLIN-TAZOBACTAM 3.375 G IVPB
3.3750 g | Freq: Once | INTRAVENOUS | Status: AC
  Administered 2010-09-17: 3.375 g via INTRAVENOUS
  Filled 2010-09-17: qty 50

## 2010-09-17 MED ORDER — ONDANSETRON HCL 4 MG/2ML IJ SOLN: 4.0000 mg | Freq: Four times a day (QID) | INTRAMUSCULAR | Status: DC | PRN

## 2010-09-17 MED ORDER — METFORMIN HCL 500 MG PO TABS
500.0000 mg | ORAL_TABLET | Freq: Two times a day (BID) | ORAL | Status: DC
  Administered 2010-09-17 – 2010-09-23 (×13): 500 mg via ORAL
  Filled 2010-09-17 (×13): qty 1

## 2010-09-17 MED ORDER — ARIPIPRAZOLE 2 MG PO TABS
2.0000 mg | ORAL_TABLET | Freq: Every day | ORAL | Status: DC
  Administered 2010-09-17 – 2010-09-23 (×7): 2 mg via ORAL
  Filled 2010-09-17 (×8): qty 1

## 2010-09-17 MED ORDER — ALUM & MAG HYDROXIDE-SIMETH 200-200-20 MG/5ML PO SUSP: 30.0000 mL | Freq: Four times a day (QID) | ORAL | Status: DC | PRN

## 2010-09-17 MED ORDER — HYDROCODONE-ACETAMINOPHEN 5-325 MG PO TABS
1.0000 | ORAL_TABLET | Freq: Four times a day (QID) | ORAL | Status: DC
  Administered 2010-09-17 – 2010-09-23 (×24): 1 via ORAL
  Filled 2010-09-17 (×23): qty 1

## 2010-09-17 MED ORDER — SODIUM CHLORIDE 0.9 % IV BOLUS (SEPSIS)
1000.0000 mL | Freq: Once | INTRAVENOUS | Status: AC
  Administered 2010-09-17: 1000 mL via INTRAVENOUS

## 2010-09-17 MED ORDER — POLYETHYLENE GLYCOL 3350 17 GM/SCOOP PO POWD
17.0000 g | Freq: Every day | ORAL | Status: DC
  Administered 2010-09-17: 17 g via ORAL
  Filled 2010-09-17: qty 255

## 2010-09-17 MED ORDER — ONDANSETRON HCL 4 MG PO TABS: 4.0000 mg | ORAL_TABLET | Freq: Four times a day (QID) | ORAL | Status: DC | PRN

## 2010-09-17 MED ORDER — ENOXAPARIN SODIUM 40 MG/0.4ML ~~LOC~~ SOLN
40.0000 mg | Freq: Every day | SUBCUTANEOUS | Status: DC
  Administered 2010-09-17 – 2010-09-20 (×4): 40 mg via SUBCUTANEOUS
  Filled 2010-09-17 (×5): qty 0.4

## 2010-09-17 MED ORDER — INSULIN ASPART 100 UNIT/ML ~~LOC~~ SOLN
12.0000 [IU] | Freq: Three times a day (TID) | SUBCUTANEOUS | Status: DC
  Administered 2010-09-18 – 2010-09-23 (×15): 12 [IU] via SUBCUTANEOUS
  Filled 2010-09-17: qty 3

## 2010-09-17 MED ORDER — CLONAZEPAM 0.5 MG PO TABS
0.5000 mg | ORAL_TABLET | Freq: Three times a day (TID) | ORAL | Status: DC | PRN
  Administered 2010-09-17 – 2010-09-23 (×14): 0.5 mg via ORAL
  Filled 2010-09-17 (×15): qty 1

## 2010-09-17 MED ORDER — SODIUM CHLORIDE 0.9 % IJ SOLN
INTRAMUSCULAR | Status: AC
  Administered 2010-09-17: 17:00:00
  Filled 2010-09-17: qty 10

## 2010-09-17 MED ORDER — MIRTAZAPINE 30 MG PO TABS
30.0000 mg | ORAL_TABLET | Freq: Every day | ORAL | Status: DC
  Administered 2010-09-17 – 2010-09-22 (×6): 30 mg via ORAL
  Filled 2010-09-17 (×6): qty 1

## 2010-09-17 MED ORDER — ACETAMINOPHEN 650 MG RE SUPP
650.0000 mg | Freq: Once | RECTAL | Status: AC
  Administered 2010-09-17: 650 mg via RECTAL
  Filled 2010-09-17: qty 1

## 2010-09-17 MED ORDER — WARFARIN SODIUM 7.5 MG PO TABS
7.5000 mg | ORAL_TABLET | Freq: Once | ORAL | Status: AC
  Administered 2010-09-17 (×2): 7.5 mg via ORAL
  Filled 2010-09-17: qty 1

## 2010-09-17 MED ORDER — METOCLOPRAMIDE HCL 10 MG PO TABS
10.0000 mg | ORAL_TABLET | Freq: Three times a day (TID) | ORAL | Status: DC
  Administered 2010-09-17 – 2010-09-23 (×19): 10 mg via ORAL
  Filled 2010-09-17 (×18): qty 1

## 2010-09-17 MED ORDER — INSULIN GLARGINE 100 UNIT/ML ~~LOC~~ SOLN
75.0000 [IU] | Freq: Every day | SUBCUTANEOUS | Status: DC
  Administered 2010-09-17 – 2010-09-22 (×5): 75 [IU] via SUBCUTANEOUS
  Filled 2010-09-17: qty 3

## 2010-09-17 MED ORDER — ENALAPRIL MALEATE 5 MG PO TABS
5.0000 mg | ORAL_TABLET | Freq: Every day | ORAL | Status: DC
  Administered 2010-09-17 – 2010-09-23 (×7): 5 mg via ORAL
  Filled 2010-09-17 (×7): qty 1

## 2010-09-17 MED ORDER — ACETAMINOPHEN 325 MG PO TABS
650.0000 mg | ORAL_TABLET | Freq: Four times a day (QID) | ORAL | Status: DC | PRN
  Administered 2010-09-17: 650 mg via ORAL
  Filled 2010-09-17: qty 2

## 2010-09-17 NOTE — ED Notes (Signed)
Pt placed on bedpan and stated that she was unable to urinate - in/out cathed pt for urine sample and urine

## 2010-09-17 NOTE — H&P (Signed)
NAMECECE, MILHOUSE               ACCOUNT NO.:  [id number]  MEDICAL RECORD NO.:  [id number]  LOCATION:  A329                          FACILITY:  [facility]  PHYSICIAN:  Kingsley Callander. Ouida Sills, MD       DATE OF BIRTH:  [birth year]  DATE OF ADMISSION:  09/17/2010 DATE OF DISCHARGE:  LH                             HISTORY & PHYSICAL   CHIEF COMPLAINT:  Shortness of breath.  HISTORY OF PRESENT ILLNESS:  This patient is a 65 year old white female who presented to the emergency room with shortness of breath and was subsequently diagnosed with pneumonia.  She has had some coughing, but limited sputum production.  She was found to have a leukocytosis at 15.1 and a chest x-ray revealing interstitial prominence and a possible retrocardiac infiltrate.  There was question of mild edema.  Arterial blood gases revealed a pH of 7.46 with a pCO2 of 33, pO2 of 72, and oxygen saturation of 95%.  The patient is essentially unable to provide any meaningful history.  She has a history of stroke.  She had a CT scan in the emergency room, which revealed old stroke changes, but no acute process.  She had a fever in the emergency room as well.  She had reportedly been confused.  Temperature was 101.2.  She is chronically anticoagulated with Coumadin for history of DVT.  She has not experienced chest pain and has normal troponin I of 0.02.  PAST MEDICAL HISTORY: 1. Stroke. 2. DVT. 3. Diabetes. 4. Depression and anxiety. 5. GERD. 6. Back surgery.  MEDICATIONS: 1. Abilify 2 mg daily. 2. Klonopin 0.5 mg t.i.d. p.r.n. 3. Cymbalta 120 mg daily. 4. Vasotec 5 mg daily. 5. Norco 5/325 q.i.d. 6. NovoLog 12 units t.i.d. 7. Lantus 75 units nightly. 8. Metformin 1000 mg b.i.d. 9. Reglan 10 mg t.i.d. 10.Metoprolol XL 25 mg daily. 11.Remeron 30 mg nightly. 12.MiraLax 17 g daily. 13.Flexeril 20 mg t.i.d. 14.Imodium 2 mg p.r.n. 15.Januvia 100 mg daily. 16.VESIcare 5 mg daily. 17.Coumadin 6 mg daily. 18.Ambien 10 mg  nightly p.r.n. 19.Aluminum, magnesium hydroxide, simethicone 30 mL daily p.r.n.  ALLERGIES:  The patient is unable to name any allergies.  SOCIAL HISTORY:  She denies any tobacco, alcohol, or drug use.  FAMILY HISTORY:  She does not know of any medical conditions of her parents.  REVIEW OF SYSTEMS:  Unobtainable.  PHYSICAL EXAMINATION:  VITAL SIGNS:  Temperature initially 101.2 with a pulse of 122, respirations 40, blood pressure 142/71, and oxygen saturation 97%. GENERAL:  Alert and in no distress at the time of my exam. HEENT:  Eyes and nose are unremarkable.  Oropharynx is moist with no teeth. NECK:  No JVD or thyromegaly. LUNGS:  Basilar rales.  No wheezes. HEART:  Tachycardic with no murmurs or gallops. ABDOMEN:  Nontender with no hepatosplenomegaly.  No distention. NEURO:  She has some tardive dyskinesia-type movements. EXTREMITIES:  No edema or clubbing.  LABORATORY DATA:  White count 15.1 with 88 segs and 9 lymphs, hemoglobin 12.3, and platelets 364,000.  Sodium 128, potassium 4.0, bicarb 25, glucose 231, BUN 11, creatinine 0.63, calcium 9.0.  INR 1.69.  ABGs as above.  Urinalysis reveals 500 mg/dL of glucose,  but is otherwise negative.  IMPRESSION/PLAN: 1. Possible pneumonia.  She has been started on IV Zosyn in the     emergency room, this will be continued. 2. Diabetes.  Continue Lantus and NovoLog and metformin. 3. History of stroke. 4. History of depression and anxiety. 5. History of deep vein thrombosis.  Consult Pharmacy for Coumadin     management.  She is currently sub-therapeutically anticoagulated.     Kingsley Callander. Ouida Sills, MD     ROF/MEDQ  D:  09/17/2010  T:  09/17/2010  Job:  [id number]

## 2010-09-17 NOTE — ED Notes (Signed)
Jean Rosenthal RN spoke with Dr. Ouida Sills on unit 300 and instructed to bring pt upstairs and he will evaluate pt.

## 2010-09-17 NOTE — Progress Notes (Signed)
PHARMACY CONSULT NOTE - Initial Consult  Pharmacy Consult for  Warfarin and Zosyn Indication: DVT (continuation of home therapy)                    Health Care Associated Pneumonia- [facility]  Patient Measurements: Height: 5\' 6"  (167.6 cm) Weight: 170 lb (77.111 kg) IBW/kg (Calculated) : 59.3  Adjusted Body Weight:N/A Vital Signs: Temp: 98.2 F (36.8 C) (08/25 0734) Temp src: Oral (08/25 0722) BP: 133/66 mmHg (08/25 0824) Pulse Rate: 114  (08/25 0824)  Labs:  Basename 09/17/10 0510  HGB 12.3  HCT 37.2  PLT 364  APTT --  LABPROT 20.2*  INR 1.69*  HEPARINUNFRC --  CREATININE 0.63  CRCLEARANCE --  CKTOTAL --  CKMB --  TROPONINI --    Medical History: Past Medical History  Diagnosis Date  . DVT (deep venous thrombosis)   . Diabetes mellitus   . Anxiety   . Depression   . Hemiplegia   . GERD (gastroesophageal reflux disease)   . Stroke   . Shortness of breath   . Coronary artery disease   . Arthritis     Medications:    Home dose: 4mg  on Sunday, Tuesday and Thursday.                       6mg  on Monday, Wednesday, Friday and                                    Saturday   Assessment: Continuation of home therapy. Admission INR below target for DVT. ClCR estimated at 73 mls/minute  Goal of Therapy:  INR 2-3   Plan:   Warfarin: 7.5mg  today.  Daily INR and adjust dose to goal. Discontinue Lovenox DVT prophylaxis dose when INR reaches 2.0  Zosyn:  Zosyn infusion over 4 hours of 3.375grams every 8 hours. Follow up renal function parameters and adjust dose if indicated. Follow up C&S.   Gilman Buttner, [place] J 09/17/2010,10:17 AM

## 2010-09-17 NOTE — ED Provider Notes (Signed)
History     CSN: [id number] Arrival date & time: 09/17/2010  4:48 AM  Chief Complaint  Patient presents with  . Shortness of Breath   HPI Comments: PT here from nursing facility for shortness of breath, confusion and reporting abd pain Pt is unable to give clear history due to confusion, which is new for her.    Patient is a 65 y.o. female presenting with shortness of breath. The history is provided by the patient, the nursing home and the EMS personnel. The history is limited by the condition of the patient.  Shortness of Breath  Episode onset: an uknown time ago. The onset was gradual. The problem has been gradually worsening. The problem is moderate. The symptoms are relieved by nothing. The symptoms are aggravated by nothing. Associated symptoms include shortness of breath.    Past Medical History  Diagnosis Date  . DVT (deep venous thrombosis)   . Diabetes mellitus   . Anxiety   . Depression   . Hemiplegia   . GERD (gastroesophageal reflux disease)   . Stroke     Past Surgical History  Procedure Date  . Back surgery     History reviewed. No pertinent family history.  History  Substance Use Topics  . Smoking status: Not on file  . Smokeless tobacco: Not on file  . Alcohol Use: No    OB History    Grav Para Term Preterm Abortions TAB SAB Ect Mult Living                  Review of Systems  Unable to perform ROS: Mental status change  Respiratory: Positive for shortness of breath.     Physical Exam  BP 142/71  Pulse 122  Temp(Src) 101.2 F (38.4 C) (Rectal)  Resp 40  Ht 5\' 6"  (1.676 m)  Wt 170 lb (77.111 kg)  BMI 27.44 kg/m2  SpO2 97%  Physical Exam  CONSTITUTIONAL: Well developed/well nourished, anxious HEAD AND FACE: Normocephalic/atraumatic EYES: EOMI/PERRL ENMT: Mucous membranes dry NECK: supple no meningeal signs CV: tachycardic, no loud murmurs LUNGS: tachypnea, rales in left base ABDOMEN: soft, nontender, no rebound or guarding GU:no  cva tenderness NEURO: Pt is awake/alert, moves all extremitiesx4 but is confused, does not follow all commands, speech is nonsensical EXTREMITIES: pulses normal, full ROM SKIN: warm, color normal PSYCH: no abnormalities of mood noted   ED Course  CRITICAL CARE Performed by: Joya Gaskins Authorized by: Joya Gaskins Total critical care time: 33 minutes Critical care time was exclusive of separately billable procedures and treating other patients. Critical care was necessary to treat or prevent imminent or life-threatening deterioration of the following conditions: respiratory failure. Critical care was time spent personally by me on the following activities: development of treatment plan with patient or surrogate, discussions with consultants, ordering and review of radiographic studies, ordering and review of laboratory studies, examination of patient, ordering and performing treatments and interventions, evaluation of patient's response to treatment, pulse oximetry and re-evaluation of patient's condition.    MDM Nursing notes reviewed and considered in documentation Previous records reviewed and considered xrays reviewed and considered All labs/vitals reviewed and considered     Date: 09/17/2010  Rate: 121  Rhythm: sinus tachycardia  QRS Axis: normal  Intervals: QT prolonged  ST/T Wave abnormalities: nonspecific ST changes  Conduction Disutrbances:none  Narrative Interpretation:   Old EKG Reviewed: none available  Results for orders placed during the hospital encounter of 09/17/10  CBC  Component Value Range   WBC 15.1 (*) 4.0 - 10.5 (K/uL)   RBC 4.52  3.87 - 5.11 (MIL/uL)   Hemoglobin 12.3  12.0 - 15.0 (g/dL)   HCT 91.4  78.2 - 95.6 (%)   MCV 82.3  78.0 - 100.0 (fL)   MCH 27.2  26.0 - 34.0 (pg)   MCHC 33.1  30.0 - 36.0 (g/dL)   RDW 21.3  08.6 - 57.8 (%)   Platelets 364  150 - 400 (K/uL)  DIFFERENTIAL      Component Value Range   Neutrophils Relative  88 (*) 43 - 77 (%)   Neutro Abs 13.2 (*) 1.7 - 7.7 (K/uL)   Lymphocytes Relative 9 (*) 12 - 46 (%)   Lymphs Abs 1.4  0.7 - 4.0 (K/uL)   Monocytes Relative 3  3 - 12 (%)   Monocytes Absolute 0.5  0.1 - 1.0 (K/uL)   Eosinophils Relative 0  0 - 5 (%)   Eosinophils Absolute 0.0  0.0 - 0.7 (K/uL)   Basophils Relative 0  0 - 1 (%)   Basophils Absolute 0.0  0.0 - 0.1 (K/uL)  BASIC METABOLIC PANEL      Component Value Range   Sodium 128 (*) 135 - 145 (mEq/L)   Potassium 4.0  3.5 - 5.1 (mEq/L)   Chloride 89 (*) 96 - 112 (mEq/L)   CO2 25  19 - 32 (mEq/L)   Glucose, Bld 231 (*) 70 - 99 (mg/dL)   BUN 11  6 - 23 (mg/dL)   Creatinine, Ser 4.69  0.50 - 1.10 (mg/dL)   Calcium 9.0  8.4 - 62.9 (mg/dL)   GFR calc non Af Amer >60  >60 (mL/min)   GFR calc Af Amer >60  >60 (mL/min)  PROTIME-INR      Component Value Range   Prothrombin Time 20.2 (*) 11.6 - 15.2 (seconds)   INR 1.69 (*) 0.00 - 1.49   BLOOD GAS, ARTERIAL      Component Value Range   O2 Content 2.0     Delivery systems NASAL CANNULA     pH, Arterial 7.466 (*) 7.350 - 7.400    pCO2 arterial 33.0 (*) 35.0 - 45.0 (mmHg)   pO2, Arterial 72.6 (*) 80.0 - 100.0 (mmHg)   Bicarbonate 23.5  20.0 - 24.0 (mEq/L)   Acid-Base Excess 0.2  0.0 - 2.0 (mmol/L)   O2 Saturation 95.2     Patient temperature 37.0     Collection site RADIAL     Drawn by COLLECTED BY RT     Sample type ARTERIAL     Allens test (pass/fail) PASS  PASS   URINALYSIS, ROUTINE W REFLEX MICROSCOPIC      Component Value Range   Color, Urine YELLOW  YELLOW    Appearance CLEAR  CLEAR    Specific Gravity, Urine 1.015  1.005 - 1.030    pH 8.0  5.0 - 8.0    Glucose, UA 500 (*) NEGATIVE (mg/dL)   Hgb urine dipstick NEGATIVE  NEGATIVE    Bilirubin Urine NEGATIVE  NEGATIVE    Ketones, ur TRACE (*) NEGATIVE (mg/dL)   Protein, ur NEGATIVE  NEGATIVE (mg/dL)   Urobilinogen, UA 0.2  0.0 - 1.0 (mg/dL)   Nitrite NEGATIVE  NEGATIVE    Leukocytes, UA NEGATIVE  NEGATIVE   GLUCOSE,  CAPILLARY      Component Value Range   Glucose-Capillary 226 (*) 70 - 99 (mg/dL)  POCT I-STAT TROPONIN I      Component  Value Range   Troponin i, poc 0.02  0.00 - 0.08 (ng/mL)   Comment 3               Pt febrile, tachypneic, confused, requiring O2, has rales in left base.  Suspicion for pneumonia.  Pt would have healthcare associated pneumonia as she lives in nursing home.  antibiotics Pt stabilized in the ED, still tachycardic D/w dr Ouida Sills, will admit patient  Joya Gaskins, MD 09/17/10 [phone]

## 2010-09-17 NOTE — ED Notes (Signed)
Pt presents with SOB at rest, anxious and tachycardic

## 2010-09-17 NOTE — ED Notes (Signed)
Called [facility] home states pt woke up around 4am hysterically SOB, thought pt was having a heart attack. Pt having difficulty ambulating, breathing, and slurred speech. Also reports 1 episode of vomiting.

## 2010-09-18 LAB — CBC
HCT: 34.3 % — ABNORMAL LOW (ref 36.0–46.0)
Hemoglobin: 11.2 g/dL — ABNORMAL LOW (ref 12.0–15.0)
MCH: 27.5 pg (ref 26.0–34.0)
MCHC: 32.7 g/dL (ref 30.0–36.0)
MCV: 84.1 fL (ref 78.0–100.0)
Platelets: 289 10*3/uL (ref 150–400)
RBC: 4.08 MIL/uL (ref 3.87–5.11)
RDW: 14.3 % (ref 11.5–15.5)
WBC: 9.4 10*3/uL (ref 4.0–10.5)

## 2010-09-18 LAB — GLUCOSE, CAPILLARY
Glucose-Capillary: 146 mg/dL — ABNORMAL HIGH (ref 70–99)
Glucose-Capillary: 178 mg/dL — ABNORMAL HIGH (ref 70–99)
Glucose-Capillary: 161 mg/dL — ABNORMAL HIGH (ref 70–99)

## 2010-09-18 LAB — PROTIME-INR
INR: 1.55 — ABNORMAL HIGH (ref 0.00–1.49)
Prothrombin Time: 18.9 seconds — ABNORMAL HIGH (ref 11.6–15.2)

## 2010-09-18 MED ORDER — WARFARIN SODIUM 7.5 MG PO TABS
7.5000 mg | ORAL_TABLET | Freq: Once | ORAL | Status: AC
  Administered 2010-09-18: 7.5 mg via ORAL
  Filled 2010-09-18: qty 1

## 2010-09-18 MED ORDER — ALBUTEROL SULFATE (5 MG/ML) 0.5% IN NEBU
2.5000 mg | INHALATION_SOLUTION | RESPIRATORY_TRACT | Status: DC
  Administered 2010-09-18 (×3): 2.5 mg via RESPIRATORY_TRACT
  Filled 2010-09-18 (×3): qty 0.5

## 2010-09-18 MED ORDER — IPRATROPIUM BROMIDE 0.02 % IN SOLN
0.5000 mg | RESPIRATORY_TRACT | Status: DC
  Administered 2010-09-18 (×3): 0.5 mg via RESPIRATORY_TRACT
  Filled 2010-09-18 (×3): qty 2.5

## 2010-09-18 MED ORDER — ALBUTEROL SULFATE (5 MG/ML) 0.5% IN NEBU: 2.5000 mg | INHALATION_SOLUTION | RESPIRATORY_TRACT | Status: DC

## 2010-09-18 MED ORDER — IPRATROPIUM BROMIDE 0.02 % IN SOLN
0.5000 mg | Freq: Four times a day (QID) | RESPIRATORY_TRACT | Status: DC
  Administered 2010-09-19 – 2010-09-23 (×17): 0.5 mg via RESPIRATORY_TRACT
  Filled 2010-09-18 (×17): qty 2.5

## 2010-09-18 MED ORDER — ALBUTEROL SULFATE (5 MG/ML) 0.5% IN NEBU: 2.5000 mg | INHALATION_SOLUTION | RESPIRATORY_TRACT | Status: DC | PRN

## 2010-09-18 MED ORDER — IPRATROPIUM BROMIDE 0.02 % IN SOLN: 0.5000 mg | Freq: Four times a day (QID) | RESPIRATORY_TRACT | Status: DC

## 2010-09-18 MED ORDER — ALBUTEROL SULFATE (5 MG/ML) 0.5% IN NEBU
2.5000 mg | INHALATION_SOLUTION | Freq: Four times a day (QID) | RESPIRATORY_TRACT | Status: DC
  Administered 2010-09-19 – 2010-09-23 (×17): 2.5 mg via RESPIRATORY_TRACT
  Filled 2010-09-18 (×17): qty 0.5

## 2010-09-18 NOTE — Progress Notes (Signed)
[name], Bauer               ACCOUNT NO.:  [id number]  MEDICAL RECORD NO.:  [id number]  LOCATION:  A329                          FACILITY:  [facility]  PHYSICIAN:  Sarajean Dessert D. [name], MD   DATE OF BIRTH:  05-05-1945  DATE OF PROCEDURE:  09/18/2010 DATE OF DISCHARGE:                                PROGRESS NOTE   SUBJECTIVE:  The patient complains of cough with wheezing and shortness of breath.  She has no fever or chills.  OBJECTIVE:  GENERAL:  The patient is alert, awake, sick looking, but not in acute distress. VITAL SIGNS:  Blood pressure 107/66, pulse 91, respiratory rate 22, temperature 97.7 degrees Fahrenheit, and oxygen saturation 95%. CHEST:  Decreased air entry, bilateral rhonchi, and crackles at the base. CARDIOVASCULAR SYSTEM:  First and second heart sound heard.  No murmur. No gallop. ABDOMEN:  Soft and lax.  Bowel sounds positive.  No mass or organomegaly. EXTREMITIES:  1+ edema.  LABORATORY DATA:  CBC, WBC 9.4, hemoglobin 11.2, hematocrit 34.3, and platelets 289.  PT 18.9, INR 1.5.  ASSESSMENT: 1. Probably pneumonia. 2. Shortness of breath to rule out chronic obstructive pulmonary     disease versus pulmonary congestion. 3. Diabetes mellitus. 4. History of cerebrovascular accident. 5. Deep venous thrombosis. 6. Anxiety and depression disorder. 7. Anemia.  PLAN:  We will start the patient on DuoNeb.  We will have do BNP.  We will discontinue IV fluids.  And will continue IV antibiotics.     [name] D. [name], MD     TDF/MEDQ  D:  09/18/2010  T:  09/18/2010  Job:  [id number]

## 2010-09-18 NOTE — Progress Notes (Signed)
PHARMACY CONSULT NOTE - Follow up Consult  Pharmacy Consult for  Warfarin and Zosyn Indication: DVT (continuation of home therapy)                    Health Care Associated Pneumonia- [facility]  Patient Measurements: Height: 5\' 6"  (167.6 cm) Weight: 169 lb 5 oz (76.8 kg) IBW/kg (Calculated) : 59.3  Adjusted Body Weight:N/A Vital Signs: Temp: 97.5 F (36.4 C) (08/26 0701) Temp src: Oral (08/26 0701) BP: 128/73 mmHg (08/26 0701) Pulse Rate: 92  (08/26 0701)  Labs:  Basename 09/18/10 0538 09/17/10 0510  HGB 11.2* 12.3  HCT 34.3* 37.2  PLT 289 364  APTT -- --  LABPROT 18.9* 20.2*  INR 1.55* 1.69*  HEPARINUNFRC -- --  CREATININE -- 0.63  CRCLEARANCE -- --  CKTOTAL -- --  CKMB -- --  TROPONINI -- --    Medical History: Past Medical History  Diagnosis Date  . DVT (deep venous thrombosis)   . Diabetes mellitus   . Anxiety   . Depression   . Hemiplegia   . GERD (gastroesophageal reflux disease)   . Stroke   . Shortness of breath   . Coronary artery disease   . Arthritis     Medications:    Home dose: 4mg  on Sunday, Tuesday and Thursday.                       6mg  on Monday, Wednesday, Friday and                                    Saturday   Assessment: Continuation of home therapy. Admission INR below target for DVT. ClCR estimated at 73 mls/minute  Goal of Therapy:  INR 2-3   Plan:   Warfarin: 7.5mg  today.  Daily INR and adjust dose to goal. Discontinue Lovenox DVT prophylaxis dose when INR reaches 2.0  Zosyn:  Zosyn infusion over 4 hours of 3.375grams every 8 hours. Follow up renal function parameters and adjust dose if indicated. Follow up C&S.   Gilman Buttner, [place] J 09/18/2010,7:53 AM

## 2010-09-19 ENCOUNTER — Encounter (HOSPITAL_COMMUNITY): Payer: Self-pay | Admitting: Internal Medicine

## 2010-09-19 LAB — GLUCOSE, CAPILLARY
Glucose-Capillary: 125 mg/dL — ABNORMAL HIGH (ref 70–99)
Glucose-Capillary: 182 mg/dL — ABNORMAL HIGH (ref 70–99)
Glucose-Capillary: 85 mg/dL (ref 70–99)
Glucose-Capillary: 77 mg/dL (ref 70–99)
Glucose-Capillary: 126 mg/dL — ABNORMAL HIGH (ref 70–99)

## 2010-09-19 LAB — PROTIME-INR
INR: 1.99 — ABNORMAL HIGH (ref 0.00–1.49)
Prothrombin Time: 22.9 seconds — ABNORMAL HIGH (ref 11.6–15.2)

## 2010-09-19 MED ORDER — SODIUM CHLORIDE 0.9 % IJ SOLN
INTRAMUSCULAR | Status: AC
  Filled 2010-09-19: qty 3

## 2010-09-19 MED ORDER — WARFARIN SODIUM 2 MG PO TABS
6.0000 mg | ORAL_TABLET | Freq: Once | ORAL | Status: AC
  Administered 2010-09-19: 6 mg via ORAL
  Filled 2010-09-19: qty 1
  Filled 2010-09-19: qty 2

## 2010-09-19 MED ORDER — BIOTENE DRY MOUTH MT LIQD: OROMUCOSAL | Status: DC | PRN

## 2010-09-19 NOTE — Progress Notes (Signed)
PHARMACY CONSULT NOTE - Follow up Consult  Pharmacy Consult for  Warfarin and Zosyn Indication: DVT (continuation of home therapy)                    Health Care Associated Pneumonia- [facility]  Patient Measurements: Height: 5\' 6"  (167.6 cm) Weight: 169 lb 5 oz (76.8 kg) IBW/kg (Calculated) : 59.3  Adjusted Body Weight:N/A Vital Signs: Temp: 97.6 F (36.4 C) (08/27 0500) Temp src: Oral (08/27 0500) BP: 124/71 mmHg (08/27 0500) Pulse Rate: 87  (08/27 0500)  Labs:  Basename 09/19/10 0508 09/18/10 0538 09/17/10 0510  HGB -- 11.2* 12.3  HCT -- 34.3* 37.2  PLT -- 289 364  APTT -- -- --  LABPROT 22.9* 18.9* 20.2*  INR 1.99* 1.55* 1.69*  HEPARINUNFRC -- -- --  CREATININE -- -- 0.63  CRCLEARANCE -- -- --  CKTOTAL -- -- --  CKMB -- -- --  TROPONINI -- -- --    Medical History: Past Medical History  Diagnosis Date  . DVT (deep venous thrombosis)   . Diabetes mellitus   . Anxiety   . Depression   . Hemiplegia   . GERD (gastroesophageal reflux disease)   . Stroke   . Shortness of breath   . Coronary artery disease   . Arthritis     Medications:    Home dose: 4mg  on Sunday, Tuesday and Thursday.                       6mg  on Monday, Wednesday, Friday and                                    Saturday   Assessment: Continuation of home therapy. Admission INR below target for DVT. ClCR estimated at 73 mls/minute  Goal of Therapy:  INR 2-3   Plan:   Warfarin: 6mg  today.  Daily INR and adjust dose to goal. Discontinue Lovenox DVT prophylaxis dose when INR reaches 2.0  Zosyn:  Zosyn infusion over 4 hours of 3.375grams every 8 hours. Follow up renal function parameters and adjust dose if indicated. Follow up C&S.   Gilman Buttner, [place] J 09/19/2010,8:44 AM

## 2010-09-19 NOTE — Progress Notes (Signed)
NAMENIEMAH, SCHWEBKE               ACCOUNT NO.:  [id number]  MEDICAL RECORD NO.:  [id number]  LOCATION:  A329                          FACILITY:  [facility]  PHYSICIAN:  Armarion Greek D. [name], MD   DATE OF BIRTH:  [birth year]  DATE OF PROCEDURE:  09/19/2010 DATE OF DISCHARGE:                                PROGRESS NOTE   SUBJECTIVE:  The patient complains of muscle spasm and pain.  Her breathing is better.  No fever or chills.  OBJECTIVE:  GENERAL:  The patient is alert, awake and looks better. VITAL SIGNS:  Blood pressure 128/73, pulse 92, respiratory rate 20, temperature 98.2 degrees Fahrenheit. CHEST:  Decreased air entry, bilateral rhonchi. CARDIOVASCULAR SYSTEM:  First and second heart sounds heard.  No murmur. No gallop. ABDOMEN:  Soft and lax.  Bowel sounds positive.  No mass, no organomegaly. EXTREMITIES:  1+ edema.  LABORATORY DATA:  PT 22.7, INR 1.99, glucose 125.  ASSESSMENT: 1. Pneumonia. 2. Probably chronic obstructive pulmonary disease. 3. History of deep venous thrombosis on anticoagulation. 4. Status post cerebrovascular accident. 5. Diabetes mellitus. 6. History of anxiety depression disorder.  PLAN:  We will continue the patient on IV antibiotics.  Continue anticoagulation.  Continue nebulizer treatment and the current supportive care.     Amberlee Garvey D. [name], MD     TDF/MEDQ  D:  09/19/2010  T:  09/19/2010  Job:  [id number]

## 2010-09-20 ENCOUNTER — Encounter (HOSPITAL_COMMUNITY): Payer: Self-pay | Admitting: *Deleted

## 2010-09-20 LAB — GLUCOSE, CAPILLARY
Glucose-Capillary: 143 mg/dL — ABNORMAL HIGH (ref 70–99)
Glucose-Capillary: 182 mg/dL — ABNORMAL HIGH (ref 70–99)
Glucose-Capillary: 161 mg/dL — ABNORMAL HIGH (ref 70–99)
Glucose-Capillary: 128 mg/dL — ABNORMAL HIGH (ref 70–99)

## 2010-09-20 LAB — CBC
HCT: 34.9 % — ABNORMAL LOW (ref 36.0–46.0)
Hemoglobin: 11.4 g/dL — ABNORMAL LOW (ref 12.0–15.0)
MCH: 27.3 pg (ref 26.0–34.0)
MCHC: 32.7 g/dL (ref 30.0–36.0)
MCV: 83.7 fL (ref 78.0–100.0)
Platelets: 273 10*3/uL (ref 150–400)
RBC: 4.17 MIL/uL (ref 3.87–5.11)
RDW: 14.2 % (ref 11.5–15.5)
WBC: 8.5 10*3/uL (ref 4.0–10.5)

## 2010-09-20 LAB — BASIC METABOLIC PANEL
BUN: 12 mg/dL (ref 6–23)
CO2: 30 mEq/L (ref 19–32)
Calcium: 8.9 mg/dL (ref 8.4–10.5)
Chloride: 99 mEq/L (ref 96–112)
Creatinine, Ser: 0.62 mg/dL (ref 0.50–1.10)
GFR calc Af Amer: >60 mL/min (ref >60)
GFR calc non Af Amer: >60 mL/min (ref >60)
Glucose, Bld: 175 mg/dL — ABNORMAL HIGH (ref 70–99)
Potassium: 3.5 mEq/L (ref 3.5–5.1)
Sodium: 137 mEq/L (ref 135–145)

## 2010-09-20 LAB — PROTIME-INR
INR: 2.41 — ABNORMAL HIGH (ref 0.00–1.49)
Prothrombin Time: 26.6 seconds — ABNORMAL HIGH (ref 11.6–15.2)

## 2010-09-20 MED ORDER — WARFARIN SODIUM 2.5 MG PO TABS
2.5000 mg | ORAL_TABLET | Freq: Once | ORAL | Status: AC
  Administered 2010-09-20: 2.5 mg via ORAL
  Filled 2010-09-20: qty 1

## 2010-09-20 MED ORDER — BACLOFEN 10 MG PO TABS
10.0000 mg | ORAL_TABLET | Freq: Three times a day (TID) | ORAL | Status: DC
  Administered 2010-09-20 – 2010-09-23 (×10): 10 mg via ORAL
  Filled 2010-09-20 (×10): qty 1

## 2010-09-20 NOTE — Progress Notes (Signed)
PHARMACY CONSULT NOTE - Follow up Consult  Pharmacy Consult for  Warfarin and Zosyn Indication: DVT (continuation of home therapy)                    [facility]  Patient Measurements: Height: 5\' 6"  (167.6 cm) Weight: 169 lb 5 oz (76.8 kg) IBW/kg (Calculated) : 59.3  Adjusted Body Weight:N/A Vital Signs: Temp: 98.2 F (36.8 C) (08/28 0458) Temp src: Oral (08/28 0458) BP: 125/71 mmHg (08/28 0458) Pulse Rate: 88  (08/28 0458)  Labs:  Basename 09/20/10 0452 09/19/10 0508 09/18/10 0538  HGB 11.4* -- 11.2*  HCT 34.9* -- 34.3*  PLT 273 -- 289  APTT -- -- --  LABPROT 26.6* 22.9* 18.9*  INR 2.41* 1.99* 1.55*  HEPARINUNFRC -- -- --  CREATININE 0.62 -- --  CRCLEARANCE -- -- --  CKTOTAL -- -- --  CKMB -- -- --  TROPONINI -- -- --    Medical History: Past Medical History  Diagnosis Date  . DVT (deep venous thrombosis)   . Diabetes mellitus   . Anxiety   . Depression   . Hemiplegia   . GERD (gastroesophageal reflux disease)   . Stroke   . Shortness of breath   . Coronary artery disease   . Arthritis     Medications:    Home dose: 4mg  on Sunday, Tuesday and Thursday.                       6mg  on Monday, Wednesday, Friday and                                    Saturday   Assessment: INR therapeutic now and rising rapidly Renal fxn OK  Goal of Therapy:  INR 2-3   Plan:   Warfarin: 2.5mg  today to discourage over-shoot of INR  Daily INR and adjust dose to goal. Discontinue Lovenox DVT prophylaxis dose when INR reaches 2.0 x 2 consecutive days  Zosyn: Zosyn infusion over 4 hours of 3.375grams every 8 hours. Follow up renal function parameters and adjust dose if indicated. Follow up C&S.   Margo Aye, Calayah Guadarrama A 09/20/2010,10:48 AM

## 2010-09-20 NOTE — Progress Notes (Signed)
[name], [name]               ACCOUNT NO.:  [id number]  MEDICAL RECORD NO.:  [id number]  LOCATION:  A329                          FACILITY:  [facility]  PHYSICIAN:  Alister Staver D. [name], MD   DATE OF BIRTH:  April 17, 1945  DATE OF PROCEDURE:  09/20/2010 DATE OF DISCHARGE:                                PROGRESS NOTE   SUBJECTIVE:  The patient complains of back pain.  Otherwise she feels better.  Her cough and shortness of breath is improving.  OBJECTIVE:  GENERAL:  The patient is alert, awake, and resting, not in acute distress. VITAL SIGNS:  Blood pressure 114/73, pulse 80, respiratory rate 20, temperature 97.4 degrees Fahrenheit. CHEST:  Decreased air entry, few rhonchi. CARDIOVASCULAR SYSTEM:  First and second heart sound heard.  No murmur. No gallop. ABDOMEN:  Soft and lax.  Bowel sound is positive.  No mass or organomegaly. EXTREMITIES:  No leg edema.  LABORATORY DATA:  PT 26.6, INR 2.41.  CBC:  WBC 8.5, hemoglobin 11.4, hematocrit 34.9, and platelets 273.  ASSESSMENT: 1. Pneumonia. 2. Chronic obstructive pulmonary disease. 3. Deep venous thrombosis. 4. Diabetes mellitus.  PLAN:  We will continue the patient on IV antibiotics.  Continue nebulizer treatment and current medications.     Hunter Pinkard D. [name], MD     TDF/MEDQ  D:  09/20/2010  T:  09/20/2010  Job:  [id number]

## 2010-09-21 LAB — GLUCOSE, CAPILLARY
Glucose-Capillary: 171 mg/dL — ABNORMAL HIGH (ref 70–99)
Glucose-Capillary: 185 mg/dL — ABNORMAL HIGH (ref 70–99)
Glucose-Capillary: 131 mg/dL — ABNORMAL HIGH (ref 70–99)

## 2010-09-21 LAB — PROTIME-INR
INR: 2.52 — ABNORMAL HIGH (ref 0.00–1.49)
Prothrombin Time: 27.6 seconds — ABNORMAL HIGH (ref 11.6–15.2)

## 2010-09-21 MED ORDER — WARFARIN SODIUM 2.5 MG PO TABS
2.5000 mg | ORAL_TABLET | Freq: Once | ORAL | Status: AC
  Administered 2010-09-21: 2.5 mg via ORAL
  Filled 2010-09-21: qty 1

## 2010-09-21 MED ORDER — SODIUM CHLORIDE 0.9 % IJ SOLN
INTRAMUSCULAR | Status: AC
  Administered 2010-09-21: 3 mL
  Filled 2010-09-21: qty 3

## 2010-09-21 NOTE — Progress Notes (Signed)
PHARMACY CONSULT NOTE - Follow up Consult  Pharmacy Consult for  Warfarin and Zosyn Indication: DVT (continuation of home therapy)                    [facility]  Patient Measurements: Height: 5\' 6"  (167.6 cm) Weight: 169 lb 5 oz (76.8 kg) IBW/kg (Calculated) : 59.3  Adjusted Body Weight:N/A Vital Signs: Temp: 98 F (36.7 C) (08/29 0532) Temp src: Oral (08/29 0532) BP: 119/73 mmHg (08/29 0532) Pulse Rate: 99  (08/29 0532)  Labs:  Basename 09/21/10 0501 09/20/10 0452 09/19/10 0508  HGB -- 11.4* --  HCT -- 34.9* --  PLT -- 273 --  APTT -- -- --  LABPROT 27.6* 26.6* 22.9*  INR 2.52* 2.41* 1.99*  HEPARINUNFRC -- -- --  CREATININE -- 0.62 --  CRCLEARANCE -- -- --  CKTOTAL -- -- --  CKMB -- -- --  TROPONINI -- -- --   INR Last Three Days: Recent Labs  Basename 09/21/10 0501 09/20/10 0452 09/19/10 0508   INR 2.52* 2.41* 1.99*     Medical History: Past Medical History  Diagnosis Date  . DVT (deep venous thrombosis)   . Diabetes mellitus   . Anxiety   . Depression   . Hemiplegia   . GERD (gastroesophageal reflux disease)   . Stroke   . Shortness of breath   . Coronary artery disease   . Arthritis     Medications:    Home dose: 4mg  on Sunday, Tuesday and Thursday.                       6mg  on Monday, Wednesday, Friday and                                    Saturday   Assessment: INR therapeutic  Goal of Therapy:  INR 2-3   Plan:   Warfarin: 2.5mg  today Daily INR and adjust dose to goal. Discontinue Lovenox DVT prophylaxis dose  Zosyn: Zosyn infusion over 4 hours of 3.375grams every 8 hours. Follow up renal function parameters and adjust dose if indicated. Follow up C&S.   Mady Gemma 09/21/2010,11:08 AM

## 2010-09-21 NOTE — Progress Notes (Signed)
[name], [name]               ACCOUNT NO.:  [id number]  MEDICAL RECORD NO.:  [id number]  LOCATION:  A329                          FACILITY:  [facility]  PHYSICIAN:  Wesam Gearhart D. [name], MD   DATE OF BIRTH:  05/12/1945  DATE OF PROCEDURE:  09/21/2010 DATE OF DISCHARGE:                                PROGRESS NOTE   SUBJECTIVE:  The patient is feeling better.  Her shortness of breath, cough, and muscle pain is much better.  No fever or chills.  OBJECTIVE:  GENERAL:  The patient is more alert, awake, and comfortable. VITAL SIGNS:  Blood pressure 113/71, pulse 85, respiratory rate 18, temperature 98 degrees Fahrenheit. CHEST:  Decreased air entry, few bilateral rhonchi. CARDIOVASCULAR SYSTEM:  First and second heart sound heard.  No murmur. No gallop. ABDOMEN:  Soft and lax.  Bowel sound is positive.  No mass or organomegaly. EXTREMITIES:  No leg edema.  LABORATORY DATA:  PT 27.6, INR 2.52.  ASSESSMENT: 1. Pneumonia. 2. Chronic obstructive pulmonary disease. 3. Status post cerebrovascular accident. 4. History of recurrent deep venous thrombosis. 5. Diabetes mellitus.  PLAN:  We will continue the patient on IV antibiotics.  Continue nebulizer treatment.  Continue anticoagulation and current treatment.     Sarafina Puthoff D. [name], MD     TDF/MEDQ  D:  09/21/2010  T:  09/21/2010  Job:  [id number]

## 2010-09-22 ENCOUNTER — Inpatient Hospital Stay (HOSPITAL_COMMUNITY): Payer: Medicare Other

## 2010-09-22 LAB — GLUCOSE, CAPILLARY
Glucose-Capillary: 108 mg/dL — ABNORMAL HIGH (ref 70–99)
Glucose-Capillary: 191 mg/dL — ABNORMAL HIGH (ref 70–99)
Glucose-Capillary: 207 mg/dL — ABNORMAL HIGH (ref 70–99)
Glucose-Capillary: 162 mg/dL — ABNORMAL HIGH (ref 70–99)
Glucose-Capillary: 185 mg/dL — ABNORMAL HIGH (ref 70–99)
Glucose-Capillary: 175 mg/dL — ABNORMAL HIGH (ref 70–99)
Glucose-Capillary: 111 mg/dL — ABNORMAL HIGH (ref 70–99)

## 2010-09-22 LAB — PROTIME-INR
INR: 1.99 — ABNORMAL HIGH (ref 0.00–1.49)
Prothrombin Time: 22.9 seconds — ABNORMAL HIGH (ref 11.6–15.2)

## 2010-09-22 MED ORDER — WARFARIN SODIUM 2 MG PO TABS
4.0000 mg | ORAL_TABLET | Freq: Once | ORAL | Status: AC
  Administered 2010-09-22: 4 mg via ORAL
  Filled 2010-09-22: qty 2

## 2010-09-22 MED ORDER — ALBUTEROL SULFATE (5 MG/ML) 0.5% IN NEBU
INHALATION_SOLUTION | RESPIRATORY_TRACT | Status: AC
  Administered 2010-09-22: 2.5 mg via RESPIRATORY_TRACT
  Filled 2010-09-22: qty 0.5

## 2010-09-22 MED ORDER — IPRATROPIUM BROMIDE 0.02 % IN SOLN
RESPIRATORY_TRACT | Status: AC
  Administered 2010-09-22: 0.5 mg via RESPIRATORY_TRACT
  Filled 2010-09-22: qty 2.5

## 2010-09-22 NOTE — Progress Notes (Signed)
PHARMACY CONSULT NOTE - Follow up Consult  Pharmacy Consult for  Warfarin and Zosyn Indication: DVT (continuation of home therapy)                    [facility]  Patient Measurements: Height: 5\' 6"  (167.6 cm) Weight: 169 lb 5 oz (76.8 kg) IBW/kg (Calculated) : 59.3  Adjusted Body Weight:N/A Vital Signs: Temp: 97.5 F (36.4 C) (08/29 2238) Temp src: Oral (08/29 2238) BP: 120/69 mmHg (08/29 2238) Pulse Rate: 85  (08/29 2238)  Labs:  Basename 09/22/10 0507 09/21/10 0501 09/20/10 0452  HGB -- -- 11.4*  HCT -- -- 34.9*  PLT -- -- 273  APTT -- -- --  LABPROT 22.9* 27.6* 26.6*  INR 1.99* 2.52* 2.41*  HEPARINUNFRC -- -- --  CREATININE -- -- 0.62  CRCLEARANCE -- -- --  CKTOTAL -- -- --  CKMB -- -- --  TROPONINI -- -- --   INR Last Three Days: Recent Labs  Basename 09/22/10 0507 09/21/10 0501 09/20/10 0452   INR 1.99* 2.52* 2.41*     Medical History: Past Medical History  Diagnosis Date  . DVT (deep venous thrombosis)   . Diabetes mellitus   . Anxiety   . Depression   . Hemiplegia   . GERD (gastroesophageal reflux disease)   . Stroke   . Shortness of breath   . Coronary artery disease   . Arthritis     Medications:    Home dose: 4mg  on Sunday, Tuesday and Thursday.                       6mg  on Monday, Wednesday, Friday and                                    Saturday   Assessment: INR therapeutic. Renal function parameters are stable. No C and S data.  Goal of Therapy:  INR 2-3   Plan:   Warfarin: 4 mg today Daily INR and adjust dose to goal. Discontinue Lovenox DVT prophylaxis dose  Zosyn: Zosyn infusion over 4 hours of 3.375grams every 8 hours. Follow up renal function parameters and adjust dose if indicated. Follow up C&S.   Gilman Buttner, [place] J 09/22/2010,9:42 AM

## 2010-09-22 NOTE — Progress Notes (Signed)
[name], SARVER               ACCOUNT NO.:  [id number]  MEDICAL RECORD NO.:  [id number]  LOCATION:  A329                          FACILITY:  [facility]  PHYSICIAN:  Amery Vandenbos D. [name], MD   DATE OF BIRTH:  1945/04/11  DATE OF PROCEDURE:  09/22/2010 DATE OF DISCHARGE:                                PROGRESS NOTE   SUBJECTIVE:  The patient is feeling better.  Her fever and cough is improving.  OBJECTIVE:  GENERAL:  The patient is more alert, awake and comfortable. VITAL SIGNS:  Blood pressure 120/69, pulse 85, respiratory rate 16, temperature 97.7 degrees Fahrenheit. CHEST:  Decreased air entry.  Few rhonchi. CARDIOVASCULAR SYSTEM:  First and second heart sound heard.  No murmur. No gallop. ABDOMEN:  Soft and lax.  Bowel sound is positive.  No mass, no organomegaly. EXTREMITIES:  No leg edema.  ASSESSMENT: 1. Pneumonia, clinically improving. 2. Chronic obstructive pulmonary disease. 3. History of recurrent deep vein thrombosis on anticoagulation. 4. Status post cerebrovascular accident.  PLAN:  We will continue the patient on IV antibiotics.  We will repeat chest x-ray.  We will continue current medications and continue supportive care.     Krystie Leiter D. [name], MD     TDF/MEDQ  D:  09/22/2010  T:  09/22/2010  Job:  [id number]

## 2010-09-23 LAB — PROTIME-INR
INR: 1.91 — ABNORMAL HIGH (ref 0.00–1.49)
Prothrombin Time: 22.2 seconds — ABNORMAL HIGH (ref 11.6–15.2)

## 2010-09-23 LAB — GLUCOSE, CAPILLARY
Glucose-Capillary: 189 mg/dL — ABNORMAL HIGH (ref 70–99)
Glucose-Capillary: 226 mg/dL — ABNORMAL HIGH (ref 70–99)

## 2010-09-23 MED ORDER — AMOXICILLIN-POT CLAVULANATE 500-125 MG PO TABS: 1.0000 | ORAL_TABLET | Freq: Three times a day (TID) | ORAL | Status: AC

## 2010-09-23 MED ORDER — SITAGLIPTIN PHOSPHATE 100 MG PO TABS: 50.0000 mg | ORAL_TABLET | Freq: Every day | ORAL | Status: DC

## 2010-09-23 MED ORDER — OMEPRAZOLE 20 MG PO CPDR: 20.0000 mg | DELAYED_RELEASE_CAPSULE | Freq: Every day | ORAL | Status: DC

## 2010-09-23 MED ORDER — WARFARIN SODIUM 5 MG PO TABS: 5.0000 mg | ORAL_TABLET | Freq: Once | ORAL | Status: DC

## 2010-09-23 MED ORDER — ALBUTEROL SULFATE (5 MG/ML) 0.5% IN NEBU: 2.5000 mg | INHALATION_SOLUTION | RESPIRATORY_TRACT | Status: DC | PRN

## 2010-09-23 MED ORDER — ZOLPIDEM TARTRATE 10 MG PO TABS: 10.0000 mg | ORAL_TABLET | Freq: Every evening | ORAL | Status: DC | PRN

## 2010-09-23 MED ORDER — ACETAMINOPHEN 325 MG PO TABS: 650.0000 mg | ORAL_TABLET | Freq: Four times a day (QID) | ORAL | Status: AC | PRN

## 2010-09-23 MED ORDER — AMOXICILLIN-POT CLAVULANATE 500-125 MG PO TABS
1.0000 | ORAL_TABLET | Freq: Three times a day (TID) | ORAL | Status: DC
  Administered 2010-09-23: 500 mg via ORAL
  Filled 2010-09-23: qty 1

## 2010-09-23 MED ORDER — SOLIFENACIN SUCCINATE 5 MG PO TABS: 5.0000 mg | ORAL_TABLET | Freq: Every day | ORAL | Status: DC

## 2010-09-23 NOTE — Progress Notes (Signed)
PHARMACY CONSULT NOTE - Follow up Consult  Pharmacy Consult for  Warfarin and Zosyn Indication: DVT (continuation of home therapy)                    [facility]  Patient Measurements: Height: 5\' 6"  (167.6 cm) Weight: 169 lb 5 oz (76.8 kg) IBW/kg (Calculated) : 59.3  Adjusted Body Weight:N/A Vital Signs: Temp: 99 F (37.2 C) (08/31 0617) Temp src: Oral (08/31 0617) BP: 127/79 mmHg (08/31 0617) Pulse Rate: 101  (08/31 0617)  Labs:  Basename 09/23/10 0502 09/22/10 0507 09/21/10 0501  HGB -- -- --  HCT -- -- --  PLT -- -- --  APTT -- -- --  LABPROT 22.2* 22.9* 27.6*  INR 1.91* 1.99* 2.52*  HEPARINUNFRC -- -- --  CREATININE -- -- --  CRCLEARANCE -- -- --  CKTOTAL -- -- --  CKMB -- -- --  TROPONINI -- -- --   INR Last Three Days: Recent Labs  Basename 09/23/10 0502 09/22/10 0507 09/21/10 0501   INR 1.91* 1.99* 2.52*     Medical History: Past Medical History  Diagnosis Date  . DVT (deep venous thrombosis)   . Diabetes mellitus   . Anxiety   . Depression   . Hemiplegia   . GERD (gastroesophageal reflux disease)   . Stroke   . Shortness of breath   . Coronary artery disease   . Arthritis     Medications:    Home dose: 4mg  on Sunday, Tuesday and Thursday.                       6mg  on Monday, Wednesday, Friday and                                    Saturday   Assessment: INR dropping... Sub therapeutic Renal function parameters are stable. No C and S data.  Goal of Therapy:  INR 2-3   Plan:   Warfarin: 5mg  today Daily INR and adjust dose to goal. Anticipate 5mg  daily should suffice for therapeutic INR  Zosyn: Zosyn infusion over 4 hours of 3.375grams every 8 hours. Follow up renal function parameters and adjust dose if indicated. Follow up C&S.   Margo Aye, Ronae Noell A 09/23/2010,7:59 AM

## 2010-09-23 NOTE — Discharge Summary (Signed)
[name], HACKLEY               ACCOUNT NO.:  [id number]  MEDICAL RECORD NO.:  [id number]  LOCATION:  A329                          FACILITY:  [facility]  PHYSICIAN:  Xaine Sansom D. [name], MD   DATE OF BIRTH:  [birth year]  DATE OF ADMISSION:  09/17/2010 DATE OF DISCHARGE:  08/30/2012LH                              DISCHARGE SUMMARY   DISCHARGE DIAGNOSES: 1. Pneumonia. 2. Chronic obstructive pulmonary disease. 3. Status post cerebrovascular accident. 4. History of recurrent deep venous thrombosis. 5. Diabetes mellitus. 6. History of anxiety depression disorder. 7. Diskogenic disease. 8. History of psychosis. 9. History of gastroesophageal reflux disease.  DISCHARGE MEDICATIONS: 1. Augmentin 500 mg p.o. q.8 h for 5 days. 2. Atrovent and Proventil nebulizer q.i.d. 3. Klonopin 0.5 mg t.i.d. 4. Zofran 4 mg in p.o. q.6 h. p.r.n. 5. Abilify 2 mg p.o. daily. 6. Baclofen 10 mg p.o. t.i.d. 7. Cymbalta 120 mg p.o. daily. 8. Vasotec 5 mg daily. 9. Norco 5/325 one tablet p.o. q.i.d. 10.Lantus insulin 75 units subcu nightly. 11.Metformin 500 mg p.o. b.i.d. 12.Metoprolol XL 25 mg p.o. daily. 13.Protonix 40 mg p.o. daily. 14.Remeron 30 mg p.o. daily. 15.Coumadin 5 mg p.o. daily.  DISPOSITION:  The patient will be discharged to [facility] in stable condition.  DISCHARGE INSTRUCTIONS:  The patient will be followed in the office in 1 week duration.  She will continue oral antibiotics.  LABS ON DISCHARGE:  PT 22.2, INR 1.91.  HOSPITAL COURSE:  This is a 65 year old white female with a history of multiple medical illnesses who was brought from [facility] due to shortness of breath.  She was evaluated in the emergency room and her chest x-ray revealed possible sign of pneumonia.  The patient was started on IV antibiotics and she was admitted for further treatment. After admission the patient was found to have symptoms of chronic obstructive pulmonary disease and was started on  nebulizer treatments. Over the hospital stay, the patient improved.  She is back to her baseline.  She will be discharged to home with oral antibiotics.     [name] D. [name], MD     TDF/MEDQ  D:  09/23/2010  T:  09/23/2010  Job:  [id number]

## 2010-09-23 NOTE — Consult Note (Signed)
Pt to D/C today to [facility].  Spoke with Pt and staff at facility.  Both are in agreement with D/C plan.  Pt will be transported by facility staff.  CSW to sign off at this time.

## 2010-09-23 NOTE — Progress Notes (Signed)
IV removed, site WNL. Pt stable and alert at time of D/C

## 2010-10-06 ENCOUNTER — Ambulatory Visit (INDEPENDENT_AMBULATORY_CARE_PROVIDER_SITE_OTHER): Payer: Medicare Other | Admitting: *Deleted

## 2010-10-06 DIAGNOSIS — Z7901 Long term (current) use of anticoagulants: Secondary | ICD-10-CM

## 2010-10-06 DIAGNOSIS — I82409 Acute embolism and thrombosis of unspecified deep veins of unspecified lower extremity: Secondary | ICD-10-CM

## 2010-10-06 DIAGNOSIS — Z8679 Personal history of other diseases of the circulatory system: Secondary | ICD-10-CM

## 2010-10-06 LAB — POCT INR: INR: 2.5

## 2010-10-13 LAB — DIFFERENTIAL
Basophils Absolute: 0
Basophils Absolute: 0
Basophils Absolute: 0
Basophils Absolute: 0.1
Basophils Relative: 0
Basophils Relative: 0
Basophils Relative: 1
Basophils Relative: 1
Eosinophils Absolute: 0.3
Eosinophils Absolute: 0.5
Eosinophils Absolute: 0.5
Eosinophils Absolute: 0.6
Eosinophils Relative: 4
Eosinophils Relative: 6 — ABNORMAL HIGH
Eosinophils Relative: 6 — ABNORMAL HIGH
Eosinophils Relative: 7 — ABNORMAL HIGH
Lymphocytes Relative: 21
Lymphocytes Relative: 26
Lymphocytes Relative: 28
Lymphocytes Relative: 29
Lymphs Abs: 1.8
Lymphs Abs: 2.1
Lymphs Abs: 2.2
Lymphs Abs: 2.5
Monocytes Absolute: 0.4
Monocytes Absolute: 0.6
Monocytes Absolute: 0.6
Monocytes Absolute: 0.7
Monocytes Relative: 6
Monocytes Relative: 7
Monocytes Relative: 7
Monocytes Relative: 8
Neutro Abs: 4.3
Neutro Abs: 5
Neutro Abs: 5.1
Neutro Abs: 5.9
Neutrophils Relative %: 57
Neutrophils Relative %: 59
Neutrophils Relative %: 60
Neutrophils Relative %: 69

## 2010-10-13 LAB — PROTIME-INR
INR: 0.9
INR: 0.9
INR: 1
INR: 1.1
INR: 1.6 — ABNORMAL HIGH
INR: 1.7 — ABNORMAL HIGH
Prothrombin Time: 12.7
Prothrombin Time: 12.9
Prothrombin Time: 13.3
Prothrombin Time: 15.1
Prothrombin Time: 19.8 — ABNORMAL HIGH
Prothrombin Time: 21 — ABNORMAL HIGH

## 2010-10-13 LAB — CBC
HCT: 35.2 — ABNORMAL LOW
HCT: 36.5
HCT: 39.1
HCT: 39.9
Hemoglobin: 11.8 — ABNORMAL LOW
Hemoglobin: 12.1
Hemoglobin: 13.2
Hemoglobin: 13.3
MCHC: 33.2
MCHC: 33.3
MCHC: 33.5
MCHC: 33.8
MCV: 87.2
MCV: 87.2
MCV: 87.9
MCV: 88.2
Platelets: 290
Platelets: 297
Platelets: 329
Platelets: 338
RBC: 4.04
RBC: 4.15
RBC: 4.48
RBC: 4.53
RDW: 12.8
RDW: 13.1
RDW: 13.3
RDW: 13.5
WBC: 7.2
WBC: 8.6
WBC: 8.6
WBC: 8.7

## 2010-10-13 LAB — HEPARIN LEVEL (UNFRACTIONATED)
Heparin Unfractionated: 0.2 — ABNORMAL LOW
Heparin Unfractionated: 0.26 — ABNORMAL LOW
Heparin Unfractionated: 0.29 — ABNORMAL LOW
Heparin Unfractionated: 0.33
Heparin Unfractionated: 0.39
Heparin Unfractionated: 0.42
Heparin Unfractionated: 0.48
Heparin Unfractionated: 0.62

## 2010-10-13 LAB — COMPREHENSIVE METABOLIC PANEL
ALT: 25
AST: 27
Albumin: 3.7
Alkaline Phosphatase: 98
BUN: 11
CO2: 22
Calcium: 9
Chloride: 101
Creatinine, Ser: 0.85
GFR calc Af Amer: >60
GFR calc non Af Amer: >60
Glucose, Bld: 248 — ABNORMAL HIGH
Potassium: 3.8
Sodium: 136
Total Bilirubin: 0.4
Total Protein: 6.8

## 2010-10-13 LAB — URINALYSIS, ROUTINE W REFLEX MICROSCOPIC
Bilirubin Urine: NEGATIVE
Glucose, UA: 250 — AB
Hgb urine dipstick: NEGATIVE
Ketones, ur: NEGATIVE
Nitrite: NEGATIVE
Protein, ur: NEGATIVE
Specific Gravity, Urine: <1.005 — ABNORMAL LOW
Urobilinogen, UA: 0.2
pH: 6.5

## 2010-10-13 LAB — APTT: aPTT: 25

## 2010-10-17 LAB — URINE CULTURE: Colony Count: 3000

## 2010-10-17 LAB — URINALYSIS, ROUTINE W REFLEX MICROSCOPIC
Glucose, UA: NEGATIVE
Hgb urine dipstick: NEGATIVE
Leukocytes, UA: NEGATIVE
Nitrite: NEGATIVE
Specific Gravity, Urine: >1.03 — ABNORMAL HIGH
Urobilinogen, UA: 0.2
pH: 5

## 2010-10-17 LAB — URINE MICROSCOPIC-ADD ON

## 2010-11-03 ENCOUNTER — Ambulatory Visit (INDEPENDENT_AMBULATORY_CARE_PROVIDER_SITE_OTHER): Payer: Medicare Other | Admitting: *Deleted

## 2010-11-03 DIAGNOSIS — Z8679 Personal history of other diseases of the circulatory system: Secondary | ICD-10-CM

## 2010-11-03 DIAGNOSIS — Z7901 Long term (current) use of anticoagulants: Secondary | ICD-10-CM

## 2010-11-03 DIAGNOSIS — I82409 Acute embolism and thrombosis of unspecified deep veins of unspecified lower extremity: Secondary | ICD-10-CM

## 2010-11-03 LAB — POCT INR: INR: 2.7

## 2010-11-05 ENCOUNTER — Encounter (HOSPITAL_COMMUNITY): Payer: Self-pay | Admitting: *Deleted

## 2010-11-05 ENCOUNTER — Other Ambulatory Visit: Payer: Self-pay

## 2010-11-05 ENCOUNTER — Emergency Department (HOSPITAL_COMMUNITY)
Admission: EM | Admit: 2010-11-05 | Discharge: 2010-11-05 | Disposition: A | Discharge status: Home / Self Care | Payer: Medicare Other | Attending: Emergency Medicine | Admitting: Emergency Medicine

## 2010-11-05 DIAGNOSIS — F329 Major depressive disorder, single episode, unspecified: Secondary | ICD-10-CM | POA: Insufficient documentation

## 2010-11-05 DIAGNOSIS — Z86718 Personal history of other venous thrombosis and embolism: Secondary | ICD-10-CM | POA: Insufficient documentation

## 2010-11-05 DIAGNOSIS — R109 Unspecified abdominal pain: Secondary | ICD-10-CM

## 2010-11-05 DIAGNOSIS — R112 Nausea with vomiting, unspecified: Secondary | ICD-10-CM

## 2010-11-05 DIAGNOSIS — K219 Gastro-esophageal reflux disease without esophagitis: Secondary | ICD-10-CM | POA: Insufficient documentation

## 2010-11-05 DIAGNOSIS — E119 Type 2 diabetes mellitus without complications: Secondary | ICD-10-CM | POA: Insufficient documentation

## 2010-11-05 DIAGNOSIS — F3289 Other specified depressive episodes: Secondary | ICD-10-CM | POA: Insufficient documentation

## 2010-11-05 DIAGNOSIS — I2510 Atherosclerotic heart disease of native coronary artery without angina pectoris: Secondary | ICD-10-CM | POA: Insufficient documentation

## 2010-11-05 DIAGNOSIS — F411 Generalized anxiety disorder: Secondary | ICD-10-CM | POA: Insufficient documentation

## 2010-11-05 DIAGNOSIS — I498 Other specified cardiac arrhythmias: Secondary | ICD-10-CM | POA: Insufficient documentation

## 2010-11-05 DIAGNOSIS — Z794 Long term (current) use of insulin: Secondary | ICD-10-CM | POA: Insufficient documentation

## 2010-11-05 DIAGNOSIS — Z7901 Long term (current) use of anticoagulants: Secondary | ICD-10-CM | POA: Insufficient documentation

## 2010-11-05 DIAGNOSIS — M129 Arthropathy, unspecified: Secondary | ICD-10-CM | POA: Insufficient documentation

## 2010-11-05 LAB — CBC
HCT: 37.2 % (ref 36.0–46.0)
Hemoglobin: 12.1 g/dL (ref 12.0–15.0)
MCH: 27.6 pg (ref 26.0–34.0)
MCHC: 32.5 g/dL (ref 30.0–36.0)
MCV: 84.7 fL (ref 78.0–100.0)
Platelets: 321 10*3/uL (ref 150–400)
RBC: 4.39 MIL/uL (ref 3.87–5.11)
RDW: 14.4 % (ref 11.5–15.5)
WBC: 8.5 10*3/uL (ref 4.0–10.5)

## 2010-11-05 LAB — COMPREHENSIVE METABOLIC PANEL
ALT: 13 U/L (ref 0–35)
AST: 16 U/L (ref 0–37)
Albumin: 3.8 g/dL (ref 3.5–5.2)
Alkaline Phosphatase: 111 U/L (ref 39–117)
BUN: 10 mg/dL (ref 6–23)
CO2: 27 mEq/L (ref 19–32)
Calcium: 9.2 mg/dL (ref 8.4–10.5)
Chloride: 96 mEq/L (ref 96–112)
Creatinine, Ser: 0.59 mg/dL (ref 0.50–1.10)
GFR calc Af Amer: >90 mL/min (ref >90)
GFR calc non Af Amer: >90 mL/min (ref >90)
Glucose, Bld: 233 mg/dL — ABNORMAL HIGH (ref 70–99)
Potassium: 3.8 mEq/L (ref 3.5–5.1)
Sodium: 135 mEq/L (ref 135–145)
Total Bilirubin: 0.2 mg/dL — ABNORMAL LOW (ref 0.3–1.2)
Total Protein: 7.6 g/dL (ref 6.0–8.3)

## 2010-11-05 LAB — APTT: aPTT: 35 seconds (ref 24–37)

## 2010-11-05 LAB — LIPASE, BLOOD: Lipase: 18 U/L (ref 11–59)

## 2010-11-05 LAB — PROTIME-INR
INR: 2.04 — ABNORMAL HIGH (ref 0.00–1.49)
Prothrombin Time: 23.4 seconds — ABNORMAL HIGH (ref 11.6–15.2)

## 2010-11-05 MED ORDER — SODIUM CHLORIDE 0.9 % IV BOLUS (SEPSIS)
1000.0000 mL | Freq: Once | INTRAVENOUS | Status: AC
  Administered 2010-11-05: 1000 mL via INTRAVENOUS

## 2010-11-05 MED ORDER — SODIUM CHLORIDE 0.9 % IV SOLN
INTRAVENOUS | Status: DC
  Administered 2010-11-05: 12:00:00 via INTRAVENOUS

## 2010-11-05 MED ORDER — ONDANSETRON HCL 4 MG/2ML IJ SOLN
4.0000 mg | Freq: Four times a day (QID) | INTRAMUSCULAR | Status: DC | PRN
  Administered 2010-11-05: 4 mg via INTRAVENOUS
  Filled 2010-11-05: qty 2

## 2010-11-05 MED ORDER — FAMOTIDINE IN NACL 20-0.9 MG/50ML-% IV SOLN
20.0000 mg | Freq: Once | INTRAVENOUS | Status: AC
  Administered 2010-11-05: 20 mg via INTRAVENOUS
  Filled 2010-11-05: qty 50

## 2010-11-05 MED ORDER — OMEPRAZOLE 20 MG PO CPDR: DELAYED_RELEASE_CAPSULE | ORAL | Status: DC

## 2010-11-05 MED ORDER — PROMETHAZINE HCL 25 MG PO TABS: 25.0000 mg | ORAL_TABLET | Freq: Four times a day (QID) | ORAL | Status: DC | PRN

## 2010-11-05 NOTE — ED Provider Notes (Signed)
History     CSN: [id number] Arrival date & time: 11/05/2010 10:11 AM  Chief Complaint  Patient presents with  . Abdominal Pain    (Consider location/radiation/quality/duration/timing/severity/associated sxs/prior treatment) HPI  Patient reports she has been belching all week. She states this morning she woke up belching and had upper abdominal pain. She states she vomited 3 times and the fluid was dark in color. She denies a having coffee ground material however. She denies melena chest pain diarrhea. She states the pain is aching and she still has nausea. She states she is dizzy and feels short of breath which is usually with her anxiety disorder. She states when she gets anxious it makes the discomfort worse she denies anything making it feel better. She states she's never had this before she states the pain does not radiate.  Past Medical History  Diagnosis Date  . DVT (deep venous thrombosis)   . Diabetes mellitus   . Anxiety   . Depression   . Hemiplegia   . GERD (gastroesophageal reflux disease)   . Stroke   . Shortness of breath   . Coronary artery disease   . Arthritis     Past Surgical History  Procedure Date  . Back surgery     No family history on file.  History  Substance Use Topics  . Smoking status: Never Smoker   . Smokeless tobacco: Not on file  . Alcohol Use: No   patient lives in a assisted living. She states she uses a walker or wheelchair to get around.  OB History    Grav Para Term Preterm Abortions TAB SAB Ect Mult Living                  Review of Systems  All other systems reviewed and are negative.    Allergies  Sulfa antibiotics and Sulfonamide derivatives  Home Medications   Current Outpatient Rx  Name Route Sig Dispense Refill  . ALBUTEROL SULFATE (5 MG/ML) 0.5% IN NEBU Nebulization Take 2.5 mg by nebulization every 2 (two) hours as needed for wheezing. 20 mL 1  . ARIPIPRAZOLE 2 MG PO TABS Oral Take 2 mg by mouth daily.        Marland Kitchen CLONAZEPAM 0.5 MG PO TABS Oral Take 0.5 mg by mouth 3 (three) times daily as needed.      . DULOXETINE HCL 60 MG PO CPEP Oral Take 120 mg by mouth daily.     . ENALAPRIL MALEATE 5 MG PO TABS Oral Take 5 mg by mouth daily.      Marland Kitchen HYDROCODONE-ACETAMINOPHEN 5-325 MG PO TABS Oral Take 1 tablet by mouth 4 (four) times daily.     . INSULIN ASPART 100 UNIT/ML [place] SOLN Subcutaneous Inject 12 Units into the skin 3 (three) times daily before meals.     . INSULIN GLARGINE 100 UNIT/ML [place] SOLN Subcutaneous Inject 75 Units into the skin at bedtime.      Marland Kitchen METFORMIN HCL 500 MG PO TABS Oral Take 500 mg by mouth 2 (two) times daily with a meal.      . METOCLOPRAMIDE HCL 10 MG PO TABS Oral Take 10 mg by mouth 3 (three) times daily.     Marland Kitchen METOPROLOL SUCCINATE 25 MG PO TB24 Oral Take 25 mg by mouth daily.      Marland Kitchen MIRTAZAPINE 30 MG PO TABS Oral Take 30 mg by mouth at bedtime.      . OMEPRAZOLE 20 MG PO CPDR Oral Take 1 capsule (  20 mg total) by mouth daily. 30 capsule 1  . POLYETHYLENE GLYCOL 3350 PO POWD Oral Take 17 g by mouth daily.      Marland Kitchen SITAGLIPTIN PHOSPHATE 100 MG PO TABS Oral Take 0.5 tablets (50 mg total) by mouth daily. 30 tablet 1  . SOLIFENACIN SUCCINATE 5 MG PO TABS Oral Take 1 tablet (5 mg total) by mouth daily. 30 tablet 1  . WARFARIN SODIUM 5 MG PO TABS Oral Take 1 tablet (5 mg total) by mouth one time only at 6 PM. 30 tablet 1  . ZOLPIDEM TARTRATE 10 MG PO TABS Oral Take 1 tablet (10 mg total) by mouth at bedtime as needed. Sleep 30 tablet 1    BP 121/61  Pulse 95  Temp 98.5 F (36.9 C)  Resp 20  Ht 5\' 6"  (1.676 m)  Wt 170 lb (77.111 kg)  BMI 27.44 kg/m2  SpO2 94% Normal without fever.  Physical Exam  Constitutional: She appears well-developed and well-nourished.  HENT:  Head: Normocephalic and atraumatic.  Mouth/Throat: Uvula is midline. Mucous membranes are dry. No oropharyngeal exudate, posterior oropharyngeal edema, posterior oropharyngeal erythema or tonsillar abscesses.        Edentulous  Eyes: Conjunctivae and EOM are normal. Pupils are equal, round, and reactive to light.  Neck: Normal range of motion. Neck supple.  Cardiovascular: Normal rate, regular rhythm, normal heart sounds and normal pulses.   Pulmonary/Chest: Effort normal and breath sounds normal.  Abdominal: Soft. Normal appearance. There is tenderness in the left upper quadrant. There is no rebound and no guarding.  Musculoskeletal:       The patient has mild left hemiparesis. She is noted to have some mild swelling of her left leg which she states is chronic.  Neurological: She is alert. No cranial nerve deficit.       Patient has weakness on her left side she is able to move her left arm and her left leg but she states she has to use a wheelchair or walker to ambulate.  Skin: Skin is warm, dry and intact. No rash noted. There is pallor.  Psychiatric: Her speech is normal and behavior is normal. Her mood appears anxious.    ED Course  Procedures (including critical care time)  Date: 11/05/2010  Rate: 103  Rhythm: sinus tachycardia  QRS Axis: normal  Intervals: normal  ST/T Wave abnormalities: normal  Conduction Disutrbances:none  Narrative Interpretation: Q  Wave in V3  Old EKG Reviewed: none available    Results for orders placed during the hospital encounter of 11/05/10  CBC      Component Value Range   WBC 8.5  4.0 - 10.5 (K/uL)   RBC 4.39  3.87 - 5.11 (MIL/uL)   Hemoglobin 12.1  12.0 - 15.0 (g/dL)   HCT 16.1  09.6 - 04.5 (%)   MCV 84.7  78.0 - 100.0 (fL)   MCH 27.6  26.0 - 34.0 (pg)   MCHC 32.5  30.0 - 36.0 (g/dL)   RDW 40.9  81.1 - 91.4 (%)   Platelets 321  150 - 400 (K/uL)  COMPREHENSIVE METABOLIC PANEL      Component Value Range   Sodium 135  135 - 145 (mEq/L)   Potassium 3.8  3.5 - 5.1 (mEq/L)   Chloride 96  96 - 112 (mEq/L)   CO2 27  19 - 32 (mEq/L)   Glucose, Bld 233 (*) 70 - 99 (mg/dL)   BUN 10  6 - 23 (mg/dL)   Creatinine, Ser  0.59  0.50 - 1.10 (mg/dL)   Calcium  9.2  8.4 - 10.5 (mg/dL)   Total Protein 7.6  6.0 - 8.3 (g/dL)   Albumin 3.8  3.5 - 5.2 (g/dL)   AST 16  0 - 37 (U/L)   ALT 13  0 - 35 (U/L)   Alkaline Phosphatase 111  39 - 117 (U/L)   Total Bilirubin 0.2 (*) 0.3 - 1.2 (mg/dL)   GFR calc non Af Amer >90  >90 (mL/min)   GFR calc Af Amer >90  >90 (mL/min)  APTT      Component Value Range   aPTT 35  24 - 37 (seconds)  PROTIME-INR      Component Value Range   Prothrombin Time 23.4 (*) 11.6 - 15.2 (seconds)   INR 2.04 (*) 0.00 - 1.49   LIPASE, BLOOD      Component Value Range   Lipase 18  11 - 59 (U/L)   Laboratory interpretation therapeutic Coumadin level and hyperglycemia otherwise normal   Medications  0.9 %  sodium chloride infusion (  Intravenous New Bag 11/05/10 1203)  ondansetron (ZOFRAN) injection 4 mg (4 mg Intravenous Given 11/05/10 1033)  famotidine (PEPCID) IVPB 20 mg (0 mg Intravenous Stopped 11/05/10 1203)  sodium chloride 0.9 % bolus 1,000 mL (1000 mL Intravenous Given 11/05/10 1033)   1:05 patient received the above medications she states she's feeling better and feels ready to go home.      Final diagnoses:  GERD (gastroesophageal reflux disease)  Abdominal  pain, other specified site  Nausea & vomiting   Discharge medications--to increase her omeprazole to BID x 2 weeks then back to once a day, phenergan tabs for nausea.    Devoria Albe, MD, FACEP   MDM          Ward Givens, MD 11/05/10 [phone]

## 2010-11-05 NOTE — ED Notes (Signed)
Pt resident of [facility]. States epigastric pain this morning. States she woke up from a nap after eating breakfast and has been belching with nausea. Pt denies CP at this time. Pt states she has acid reflux and feels like she is also having a panic attack.

## 2010-12-01 ENCOUNTER — Ambulatory Visit (INDEPENDENT_AMBULATORY_CARE_PROVIDER_SITE_OTHER): Payer: Medicare Other | Admitting: *Deleted

## 2010-12-01 DIAGNOSIS — I82409 Acute embolism and thrombosis of unspecified deep veins of unspecified lower extremity: Secondary | ICD-10-CM

## 2010-12-01 DIAGNOSIS — Z8679 Personal history of other diseases of the circulatory system: Secondary | ICD-10-CM

## 2010-12-01 DIAGNOSIS — Z7901 Long term (current) use of anticoagulants: Secondary | ICD-10-CM

## 2010-12-01 LAB — POCT INR: INR: 2.5

## 2010-12-27 ENCOUNTER — Other Ambulatory Visit (HOSPITAL_COMMUNITY): Payer: Self-pay | Admitting: Internal Medicine

## 2010-12-27 ENCOUNTER — Ambulatory Visit (HOSPITAL_COMMUNITY)
Admission: RE | Admit: 2010-12-27 | Discharge: 2010-12-27 | Disposition: A | Discharge status: Home / Self Care | Payer: Medicare Other | Source: Ambulatory Visit | Attending: Internal Medicine | Admitting: Internal Medicine

## 2010-12-27 DIAGNOSIS — R05 Cough: Secondary | ICD-10-CM | POA: Insufficient documentation

## 2010-12-27 DIAGNOSIS — R059 Cough, unspecified: Secondary | ICD-10-CM | POA: Insufficient documentation

## 2010-12-27 DIAGNOSIS — R0989 Other specified symptoms and signs involving the circulatory and respiratory systems: Secondary | ICD-10-CM

## 2011-01-12 ENCOUNTER — Ambulatory Visit (INDEPENDENT_AMBULATORY_CARE_PROVIDER_SITE_OTHER): Payer: Medicare Other | Admitting: *Deleted

## 2011-01-12 DIAGNOSIS — I82409 Acute embolism and thrombosis of unspecified deep veins of unspecified lower extremity: Secondary | ICD-10-CM

## 2011-01-12 DIAGNOSIS — Z8679 Personal history of other diseases of the circulatory system: Secondary | ICD-10-CM

## 2011-01-12 DIAGNOSIS — Z7901 Long term (current) use of anticoagulants: Secondary | ICD-10-CM

## 2011-01-12 LAB — POCT INR: INR: 3

## 2011-02-02 DIAGNOSIS — I69998 Other sequelae following unspecified cerebrovascular disease: Secondary | ICD-10-CM | POA: Diagnosis not present

## 2011-02-15 DIAGNOSIS — M79609 Pain in unspecified limb: Secondary | ICD-10-CM | POA: Diagnosis not present

## 2011-02-15 DIAGNOSIS — B351 Tinea unguium: Secondary | ICD-10-CM | POA: Diagnosis not present

## 2011-02-23 ENCOUNTER — Ambulatory Visit (INDEPENDENT_AMBULATORY_CARE_PROVIDER_SITE_OTHER): Payer: Medicare Other | Admitting: *Deleted

## 2011-02-23 DIAGNOSIS — Z8679 Personal history of other diseases of the circulatory system: Secondary | ICD-10-CM | POA: Diagnosis not present

## 2011-02-23 DIAGNOSIS — Z7901 Long term (current) use of anticoagulants: Secondary | ICD-10-CM

## 2011-02-23 DIAGNOSIS — I82409 Acute embolism and thrombosis of unspecified deep veins of unspecified lower extremity: Secondary | ICD-10-CM | POA: Diagnosis not present

## 2011-02-23 LAB — POCT INR: INR: 1.9

## 2011-03-02 DIAGNOSIS — I69998 Other sequelae following unspecified cerebrovascular disease: Secondary | ICD-10-CM | POA: Diagnosis not present

## 2011-03-03 DIAGNOSIS — F329 Major depressive disorder, single episode, unspecified: Secondary | ICD-10-CM | POA: Diagnosis not present

## 2011-03-07 DIAGNOSIS — H2510 Age-related nuclear cataract, unspecified eye: Secondary | ICD-10-CM | POA: Diagnosis not present

## 2011-03-07 DIAGNOSIS — H5200 Hypermetropia, unspecified eye: Secondary | ICD-10-CM | POA: Diagnosis not present

## 2011-03-07 DIAGNOSIS — E1139 Type 2 diabetes mellitus with other diabetic ophthalmic complication: Secondary | ICD-10-CM | POA: Diagnosis not present

## 2011-03-07 DIAGNOSIS — H52229 Regular astigmatism, unspecified eye: Secondary | ICD-10-CM | POA: Diagnosis not present

## 2011-03-19 ENCOUNTER — Encounter (HOSPITAL_COMMUNITY): Payer: Self-pay

## 2011-03-19 ENCOUNTER — Emergency Department (HOSPITAL_COMMUNITY)
Admission: EM | Admit: 2011-03-19 | Discharge: 2011-03-19 | Disposition: A | Discharge status: Home / Self Care | Payer: Medicare Other | Attending: Emergency Medicine | Admitting: Emergency Medicine

## 2011-03-19 ENCOUNTER — Emergency Department (HOSPITAL_COMMUNITY): Payer: Medicare Other

## 2011-03-19 DIAGNOSIS — J3489 Other specified disorders of nose and nasal sinuses: Secondary | ICD-10-CM | POA: Diagnosis not present

## 2011-03-19 DIAGNOSIS — Z7901 Long term (current) use of anticoagulants: Secondary | ICD-10-CM | POA: Insufficient documentation

## 2011-03-19 DIAGNOSIS — Z794 Long term (current) use of insulin: Secondary | ICD-10-CM | POA: Diagnosis not present

## 2011-03-19 DIAGNOSIS — R51 Headache: Secondary | ICD-10-CM | POA: Diagnosis not present

## 2011-03-19 DIAGNOSIS — J019 Acute sinusitis, unspecified: Secondary | ICD-10-CM | POA: Diagnosis not present

## 2011-03-19 DIAGNOSIS — K219 Gastro-esophageal reflux disease without esophagitis: Secondary | ICD-10-CM | POA: Insufficient documentation

## 2011-03-19 DIAGNOSIS — E119 Type 2 diabetes mellitus without complications: Secondary | ICD-10-CM | POA: Insufficient documentation

## 2011-03-19 DIAGNOSIS — I2510 Atherosclerotic heart disease of native coronary artery without angina pectoris: Secondary | ICD-10-CM | POA: Diagnosis not present

## 2011-03-19 DIAGNOSIS — J329 Chronic sinusitis, unspecified: Secondary | ICD-10-CM | POA: Insufficient documentation

## 2011-03-19 DIAGNOSIS — Z8679 Personal history of other diseases of the circulatory system: Secondary | ICD-10-CM | POA: Insufficient documentation

## 2011-03-19 DIAGNOSIS — Z86718 Personal history of other venous thrombosis and embolism: Secondary | ICD-10-CM | POA: Diagnosis not present

## 2011-03-19 DIAGNOSIS — I6789 Other cerebrovascular disease: Secondary | ICD-10-CM | POA: Diagnosis not present

## 2011-03-19 LAB — GLUCOSE, CAPILLARY: Glucose-Capillary: 223 mg/dL — ABNORMAL HIGH (ref 70–99)

## 2011-03-19 MED ORDER — OXYCODONE-ACETAMINOPHEN 5-325 MG PO TABS: 1.0000 | ORAL_TABLET | Freq: Four times a day (QID) | ORAL | Status: DC | PRN

## 2011-03-19 MED ORDER — MORPHINE SULFATE 4 MG/ML IJ SOLN
8.0000 mg | Freq: Once | INTRAMUSCULAR | Status: AC
  Administered 2011-03-19: 8 mg via INTRAMUSCULAR
  Filled 2011-03-19: qty 2

## 2011-03-19 MED ORDER — AMOXICILLIN 500 MG PO CAPS: 500.0000 mg | ORAL_CAPSULE | Freq: Three times a day (TID) | ORAL | Status: AC

## 2011-03-19 MED ORDER — HYDROMORPHONE HCL PF 1 MG/ML IJ SOLN
1.0000 mg | Freq: Once | INTRAMUSCULAR | Status: AC
  Administered 2011-03-19: 1 mg via INTRAMUSCULAR
  Filled 2011-03-19: qty 1

## 2011-03-19 NOTE — Discharge Instructions (Signed)
Sinus Headache A sinus headache is when your sinuses become clogged or swollen. Sinus headaches can range from mild to severe.  CAUSES A sinus headache can have different causes, such as:  Colds.   Sinus infections.   Allergies.  SYMPTOMS  Symptoms of a sinus headache may vary and can include:  Headache.   Pain or pressure in the face.   Congested or runny nose.   Fever.   Inability to smell.   Pain in upper teeth.  Weather changes can make symptoms worse. TREATMENT  The treatment of a sinus headache depends on the cause.  Sinus pain caused by a sinus infection may be treated with antibiotic medicine.   Sinus pain caused by allergies may be helped by allergy medicines (antihistamines) and medicated nasal sprays.   Sinus pain caused by congestion may be helped by flushing the nose and sinuses with saline solution.  HOME CARE INSTRUCTIONS   If antibiotics are prescribed, take them as directed. Finish them even if you start to feel better.   Only take over-the-counter or prescription medicines for pain, discomfort, or fever as directed by your caregiver.   If you have congestion, use a nasal spray to help reduce pressure.  SEEK IMMEDIATE MEDICAL CARE IF:  You have a fever.   You have headaches more than once a week.   You have sensitivity to light or sound.   You have repeated nausea and vomiting.   You have vision problems.   You have sudden, severe pain in your face or head.   You have a seizure.   You are confused.   Your sinus headaches do not get better after treatment. Many people think they have a sinus headache when they actually have migraines or tension headaches.  MAKE SURE YOU:   Understand these instructions.   Will watch your condition.   Will get help right away if you are not doing well or get worse.  Document Released: 02/17/2004 Document Revised: 09/21/2010 Document Reviewed: 04/09/2010 [employer] Patient Information 2012 [employer]. 

## 2011-03-19 NOTE — ED Notes (Signed)
Pt c/o nose stopped up and "sinus headache" x 1 week.

## 2011-03-19 NOTE — ED Provider Notes (Signed)
History   This chart was scribed for Geoffery Lyons, MD by Clarita Crane. The patient was seen in room APA03/APA03. Patient's care was started at 1130.    CSN: [id number]  Arrival date & time 03/19/11  1130   First MD Initiated Contact with Patient 03/19/11 1141      Chief Complaint  Patient presents with  . Headache    (Consider location/radiation/quality/duration/timing/severity/associated sxs/prior treatment) HPI [name] is a 66 y.o. female who presents to the Emergency Department complaining of constant moderate HA onset 1 week ago and persistent since with associated nasal congestion and sinus pressure. Denies nausea, vomiting, diarrhea and recent falls/trauma. Patient with h/o diabetes, DVT, stroke, CAD and is currently on Coumadin.   PCP-[name]  Past Medical History  Diagnosis Date  . DVT (deep venous thrombosis)   . Diabetes mellitus   . Anxiety   . Depression   . Hemiplegia   . GERD (gastroesophageal reflux disease)   . Stroke   . Shortness of breath   . Coronary artery disease   . Arthritis     Past Surgical History  Procedure Date  . Back surgery     No family history on file.  History  Substance Use Topics  . Smoking status: Never Smoker   . Smokeless tobacco: Not on file  . Alcohol Use: No  -Resident of [facility]  OB History    Grav Para Term Preterm Abortions TAB SAB Ect Mult Living                  Review of Systems 10 Systems reviewed and are negative for acute change except as noted in the HPI.  Allergies  Sulfa antibiotics and Sulfonamide derivatives  Home Medications   Current Outpatient Rx  Name Route Sig Dispense Refill  . ACETAMINOPHEN 325 MG PO TABS Oral Take 650 mg by mouth every 6 (six) hours as needed. For fever > 101     . ALBUTEROL SULFATE (2.5 MG/3ML) 0.083% IN NEBU Nebulization Take 2.5 mg by nebulization every 2 (two) hours as needed. For wheezing     . ALBUTEROL SULFATE (5 MG/ML) 0.5% IN NEBU  Nebulization Take 2.5 mg by nebulization every 2 (two) hours as needed for wheezing. 20 mL 1  . ARIPIPRAZOLE 2 MG PO TABS Oral Take 2 mg by mouth every morning.     Marland Kitchen MYLANTA PO Oral Take 30 mLs by mouth daily as needed. For indigestion     . CLONAZEPAM 0.5 MG PO TABS Oral Take 0.5 mg by mouth 3 (three) times daily.     . CYCLOBENZAPRINE HCL 10 MG PO TABS Oral Take 20 mg by mouth 3 (three) times daily.      . DULOXETINE HCL 60 MG PO CPEP Oral Take 120 mg by mouth daily.     . ENALAPRIL MALEATE 5 MG PO TABS Oral Take 5 mg by mouth every morning.     Marland Kitchen HYDROCODONE-ACETAMINOPHEN 5-325 MG PO TABS Oral Take 1 tablet by mouth 4 (four) times daily.     . INSULIN ASPART 100 UNIT/ML [place] SOLN Subcutaneous Inject 12 Units into the skin 3 (three) times daily before meals. Hold if blood sugar is less than 100    . INSULIN ASPART 100 UNIT/ML [place] SOLN Subcutaneous Inject 1-6 Units into the skin 3 (three) times daily before meals. Addition to the normal 12 unit dose: If BG 150-200: add 1 unit, 201-250: add 2 units, 251-300: add 3 units, 301-350:  add 4 units, 351-400: add 5 units, >400: add 6 units     . INSULIN GLARGINE 100 UNIT/ML [place] SOLN Subcutaneous Inject 75 Units into the skin at bedtime.     Marland Kitchen LOPERAMIDE HCL 2 MG PO CAPS Oral Take 4 mg by mouth 4 (four) times daily as needed. Take 2 capsules initially then take 2 after each loose stool per MAR     . METFORMIN HCL 500 MG PO TABS Oral Take 500 mg by mouth 2 (two) times daily with a meal.      . METOCLOPRAMIDE HCL 10 MG PO TABS Oral Take 10 mg by mouth 3 (three) times daily before meals.     Marland Kitchen METOPROLOL SUCCINATE ER 25 MG PO TB24 Oral Take 25 mg by mouth 2 (two) times daily.     Marland Kitchen MIRTAZAPINE 30 MG PO TABS Oral Take 30 mg by mouth at bedtime.      . OMEPRAZOLE 20 MG PO CPDR Oral Take 1 capsule (20 mg total) by mouth daily. 30 capsule 1  . OMEPRAZOLE 20 MG PO CPDR  Take 1 po BID x 2 weeks then back down to once a day 30 capsule 0  . ONDANSETRON 4 MG PO TBDP Oral  Take 4 mg by mouth every 6 (six) hours as needed. For nausea and vomiting.     Marland Kitchen POLYETHYLENE GLYCOL 3350 PO POWD Oral Take 17 g by mouth daily.      Marland Kitchen SITAGLIPTIN PHOSPHATE 100 MG PO TABS Oral Take 100 mg by mouth daily. As printed on MAR. One tablet by mouth once daily     . SOLIFENACIN SUCCINATE 5 MG PO TABS Oral Take 1 tablet (5 mg total) by mouth daily. 30 tablet 1  . WARFARIN SODIUM 5 MG PO TABS Oral Take 1 tablet (5 mg total) by mouth one time only at 6 PM. 30 tablet 1  . ZOLPIDEM TARTRATE 10 MG PO TABS Oral Take 1 tablet (10 mg total) by mouth at bedtime as needed. Sleep 30 tablet 1    BP 135/79  Pulse 113  Resp 20  Ht 5\' 5"  (1.651 m)  Wt 177 lb (80.287 kg)  BMI 29.45 kg/m2  SpO2 98%  Physical Exam  Nursing note and vitals reviewed. Constitutional: She is oriented to person, place, and time. She appears well-developed and well-nourished. No distress.  HENT:  Head: Normocephalic and atraumatic.  Mouth/Throat: Oropharynx is clear and moist.  Eyes: EOM are normal. Pupils are equal, round, and reactive to light.  Neck: Neck supple. No tracheal deviation present.  Cardiovascular: Normal rate and regular rhythm.  Exam reveals no gallop and no friction rub.   No murmur heard. Pulmonary/Chest: Effort normal. No respiratory distress. She has no wheezes. She has no rales.  Abdominal: Soft. She exhibits no distension. There is no tenderness. There is no rebound.  Musculoskeletal: Normal range of motion. She exhibits no edema.  Neurological: She is alert and oriented to person, place, and time. No sensory deficit.  Skin: Skin is warm and dry.  Psychiatric: She has a normal mood and affect. Her behavior is normal.    ED Course  Procedures (including critical care time)  DIAGNOSTIC STUDIES: Oxygen Saturation is 98% on room air, normal by my interpretation.    COORDINATION OF CARE: 11:52AM- Patient informed of current plan for treatment and evaluation and agrees with plan at this  time.     Labs Reviewed  GLUCOSE, CAPILLARY - Abnormal; Notable for the following:  Glucose-Capillary 223 (*)    All other components within normal limits   No results found.   No diagnosis found.    MDM  The ct looks okay.  Will discharge to home with antibiotics, pain meds.  Patient seems pretty certain she has a sinus infection.      I personally performed the services described in this documentation, which was scribed in my presence. The recorded information has been reviewed and considered.    Geoffery Lyons, MD 03/19/11 1240

## 2011-03-19 NOTE — ED Notes (Signed)
Pt cont. To c/o of pain, "that dam morphine does not help" "you need to tell the dam doctor", pt yelling out out. And cursing at staff at times. md notified and meds ordered and given.   Report called to donna at [facility].  Pt picked up by missy from home.  Pt left in wheelchair.

## 2011-03-20 DIAGNOSIS — J410 Simple chronic bronchitis: Secondary | ICD-10-CM | POA: Diagnosis not present

## 2011-03-20 DIAGNOSIS — E119 Type 2 diabetes mellitus without complications: Secondary | ICD-10-CM | POA: Diagnosis not present

## 2011-03-27 ENCOUNTER — Ambulatory Visit (INDEPENDENT_AMBULATORY_CARE_PROVIDER_SITE_OTHER): Payer: Medicare Other | Admitting: *Deleted

## 2011-03-27 DIAGNOSIS — Z7901 Long term (current) use of anticoagulants: Secondary | ICD-10-CM

## 2011-03-27 DIAGNOSIS — I82409 Acute embolism and thrombosis of unspecified deep veins of unspecified lower extremity: Secondary | ICD-10-CM

## 2011-03-27 DIAGNOSIS — Z8679 Personal history of other diseases of the circulatory system: Secondary | ICD-10-CM | POA: Diagnosis not present

## 2011-03-27 LAB — POCT INR: INR: 1.8

## 2011-03-28 ENCOUNTER — Emergency Department (HOSPITAL_COMMUNITY): Payer: Medicare Other

## 2011-03-28 ENCOUNTER — Emergency Department (HOSPITAL_COMMUNITY)
Admission: EM | Admit: 2011-03-28 | Discharge: 2011-03-28 | Disposition: A | Discharge status: Home / Self Care | Payer: Medicare Other | Attending: Emergency Medicine | Admitting: Emergency Medicine

## 2011-03-28 ENCOUNTER — Encounter (HOSPITAL_COMMUNITY): Payer: Self-pay | Admitting: *Deleted

## 2011-03-28 DIAGNOSIS — E119 Type 2 diabetes mellitus without complications: Secondary | ICD-10-CM | POA: Diagnosis not present

## 2011-03-28 DIAGNOSIS — F411 Generalized anxiety disorder: Secondary | ICD-10-CM | POA: Insufficient documentation

## 2011-03-28 DIAGNOSIS — M549 Dorsalgia, unspecified: Secondary | ICD-10-CM | POA: Diagnosis not present

## 2011-03-28 DIAGNOSIS — F3289 Other specified depressive episodes: Secondary | ICD-10-CM | POA: Insufficient documentation

## 2011-03-28 DIAGNOSIS — Z86718 Personal history of other venous thrombosis and embolism: Secondary | ICD-10-CM | POA: Diagnosis not present

## 2011-03-28 DIAGNOSIS — D72829 Elevated white blood cell count, unspecified: Secondary | ICD-10-CM | POA: Insufficient documentation

## 2011-03-28 DIAGNOSIS — K219 Gastro-esophageal reflux disease without esophagitis: Secondary | ICD-10-CM | POA: Insufficient documentation

## 2011-03-28 DIAGNOSIS — I517 Cardiomegaly: Secondary | ICD-10-CM | POA: Diagnosis not present

## 2011-03-28 DIAGNOSIS — J40 Bronchitis, not specified as acute or chronic: Secondary | ICD-10-CM | POA: Diagnosis not present

## 2011-03-28 DIAGNOSIS — Z7901 Long term (current) use of anticoagulants: Secondary | ICD-10-CM | POA: Diagnosis not present

## 2011-03-28 DIAGNOSIS — Z79899 Other long term (current) drug therapy: Secondary | ICD-10-CM | POA: Diagnosis not present

## 2011-03-28 DIAGNOSIS — R142 Eructation: Secondary | ICD-10-CM | POA: Insufficient documentation

## 2011-03-28 DIAGNOSIS — I2510 Atherosclerotic heart disease of native coronary artery without angina pectoris: Secondary | ICD-10-CM | POA: Insufficient documentation

## 2011-03-28 DIAGNOSIS — R143 Flatulence: Secondary | ICD-10-CM | POA: Diagnosis not present

## 2011-03-28 DIAGNOSIS — F329 Major depressive disorder, single episode, unspecified: Secondary | ICD-10-CM | POA: Insufficient documentation

## 2011-03-28 DIAGNOSIS — M129 Arthropathy, unspecified: Secondary | ICD-10-CM | POA: Insufficient documentation

## 2011-03-28 DIAGNOSIS — R1110 Vomiting, unspecified: Secondary | ICD-10-CM | POA: Diagnosis not present

## 2011-03-28 DIAGNOSIS — R141 Gas pain: Secondary | ICD-10-CM | POA: Insufficient documentation

## 2011-03-28 DIAGNOSIS — R112 Nausea with vomiting, unspecified: Secondary | ICD-10-CM | POA: Diagnosis not present

## 2011-03-28 DIAGNOSIS — Z794 Long term (current) use of insulin: Secondary | ICD-10-CM | POA: Diagnosis not present

## 2011-03-28 LAB — CBC
HCT: 37.1 % (ref 36.0–46.0)
Hemoglobin: 12.1 g/dL (ref 12.0–15.0)
MCH: 27.3 pg (ref 26.0–34.0)
MCHC: 32.6 g/dL (ref 30.0–36.0)
MCV: 83.7 fL (ref 78.0–100.0)
Platelets: 384 10*3/uL (ref 150–400)
RBC: 4.43 MIL/uL (ref 3.87–5.11)
RDW: 15.6 % — ABNORMAL HIGH (ref 11.5–15.5)
WBC: 16 10*3/uL — ABNORMAL HIGH (ref 4.0–10.5)

## 2011-03-28 LAB — DIFFERENTIAL
Basophils Absolute: 0 10*3/uL (ref 0.0–0.1)
Basophils Relative: 0 % (ref 0–1)
Eosinophils Absolute: 0.1 10*3/uL (ref 0.0–0.7)
Eosinophils Relative: 1 % (ref 0–5)
Lymphocytes Relative: 12 % (ref 12–46)
Lymphs Abs: 1.9 10*3/uL (ref 0.7–4.0)
Monocytes Absolute: 1.4 10*3/uL — ABNORMAL HIGH (ref 0.1–1.0)
Monocytes Relative: 9 % (ref 3–12)
Neutro Abs: 12.5 10*3/uL — ABNORMAL HIGH (ref 1.7–7.7)
Neutrophils Relative %: 78 % — ABNORMAL HIGH (ref 43–77)

## 2011-03-28 LAB — COMPREHENSIVE METABOLIC PANEL
ALT: 11 U/L (ref 0–35)
AST: 11 U/L (ref 0–37)
Albumin: 3.6 g/dL (ref 3.5–5.2)
Alkaline Phosphatase: 92 U/L (ref 39–117)
BUN: 16 mg/dL (ref 6–23)
CO2: 27 mEq/L (ref 19–32)
Calcium: 9.2 mg/dL (ref 8.4–10.5)
Chloride: 101 mEq/L (ref 96–112)
Creatinine, Ser: 0.7 mg/dL (ref 0.50–1.10)
GFR calc Af Amer: >90 mL/min (ref >90)
GFR calc non Af Amer: 89 mL/min — ABNORMAL LOW (ref >90)
Glucose, Bld: 76 mg/dL (ref 70–99)
Potassium: 3.6 mEq/L (ref 3.5–5.1)
Sodium: 137 mEq/L (ref 135–145)
Total Bilirubin: 0.3 mg/dL (ref 0.3–1.2)
Total Protein: 7.3 g/dL (ref 6.0–8.3)

## 2011-03-28 LAB — PROTIME-INR
INR: 1.4 (ref 0.00–1.49)
Prothrombin Time: 17.4 seconds — ABNORMAL HIGH (ref 11.6–15.2)

## 2011-03-28 LAB — GLUCOSE, CAPILLARY: Glucose-Capillary: 76 mg/dL (ref 70–99)

## 2011-03-28 MED ORDER — ONDANSETRON HCL 4 MG PO TABS: 4.0000 mg | ORAL_TABLET | Freq: Four times a day (QID) | ORAL | Status: AC

## 2011-03-28 MED ORDER — MORPHINE SULFATE 2 MG/ML IJ SOLN
2.0000 mg | Freq: Once | INTRAMUSCULAR | Status: DC
  Filled 2011-03-28: qty 1

## 2011-03-28 MED ORDER — ONDANSETRON HCL 4 MG/2ML IJ SOLN
4.0000 mg | Freq: Once | INTRAMUSCULAR | Status: AC
  Administered 2011-03-28: 4 mg via INTRAVENOUS
  Filled 2011-03-28: qty 2

## 2011-03-28 MED ORDER — PANTOPRAZOLE SODIUM 40 MG IV SOLR
40.0000 mg | Freq: Once | INTRAVENOUS | Status: AC
  Administered 2011-03-28: 40 mg via INTRAVENOUS
  Filled 2011-03-28: qty 40

## 2011-03-28 MED ORDER — SODIUM CHLORIDE 0.9 % IV SOLN
Freq: Once | INTRAVENOUS | Status: AC
  Administered 2011-03-28: 1000 mL via INTRAVENOUS

## 2011-03-28 NOTE — ED Provider Notes (Signed)
History   This chart was scribed for [employer]. Colon Branch, MD by Clarita Crane. The patient was seen in room APA19/APA19. Patient's care was started at 1253.    CSN: [id number]  Arrival date & time 03/28/11  1253   First MD Initiated Contact with Patient 03/28/11 1436      Chief Complaint  Patient presents with  . Emesis    (Consider location/radiation/quality/duration/timing/severity/associated sxs/prior treatment) HPI [name] is a 66 y.o. female who presents to the Emergency Department complaining of constant moderate nausea and vomiting onset several days ago but became worse 3 days ago with associated back pain. Patient notes she recently had medications changed by Dr. [name] due to [facility] regulations 2 weeks ago and has been experiencing increasing nausea and vomiting since that time. Also notes she is currently being treated for a sinus infection with Amoxicillin. Last bowel movement was yesterday and described as hard. Patient with h/o DVT, diabetes, stroke, CAD.  PCP- [name]  Past Medical History  Diagnosis Date  . DVT (deep venous thrombosis)   . Diabetes mellitus   . Anxiety   . Depression   . Hemiplegia   . GERD (gastroesophageal reflux disease)   . Stroke   . Shortness of breath   . Coronary artery disease   . Arthritis   . Anxiety     Past Surgical History  Procedure Date  . Back surgery     History reviewed. No pertinent family history.  History  Substance Use Topics  . Smoking status: Never Smoker   . Smokeless tobacco: Not on file  . Alcohol Use: No    OB History    Grav Para Term Preterm Abortions TAB SAB Ect Mult Living                  Review of Systems 10 Systems reviewed and are negative for acute change except as noted in the HPI.  Allergies  Sulfa antibiotics and Sulfonamide derivatives  Home Medications   Current Outpatient Rx  Name Route Sig Dispense Refill  . AMOXICILLIN 500 MG PO CAPS Oral Take 1 capsule (500 mg total) by  mouth 3 (three) times daily. 30 capsule 0  . BACLOFEN 10 MG PO TABS Oral Take 15 mg by mouth 3 (three) times daily.    . DULOXETINE HCL 60 MG PO CPEP Oral Take 120 mg by mouth daily.     . ENALAPRIL MALEATE 5 MG PO TABS Oral Take 5 mg by mouth every morning.     Marland Kitchen HYDROCODONE-ACETAMINOPHEN 5-325 MG PO TABS Oral Take 1 tablet by mouth 4 (four) times daily.     . INSULIN ASPART 100 UNIT/ML [place] SOLN Subcutaneous Inject 12 Units into the skin 3 (three) times daily before meals. Hold if blood sugar is less than 100    . INSULIN GLARGINE 100 UNIT/ML [place] SOLN Subcutaneous Inject 75 Units into the skin at bedtime.     . IPRATROPIUM BROMIDE 0.02 % IN SOLN Nebulization Take 500 mcg by nebulization 4 (four) times daily as needed. Shortness of breath    . LORATADINE 10 MG PO TABS Oral Take 10 mg by mouth daily. To take for 10 days.    Marland Kitchen LORAZEPAM 1 MG PO TABS Oral Take 1 mg by mouth 3 (three) times daily.    Marland Kitchen METFORMIN HCL 500 MG PO TABS Oral Take 500 mg by mouth 2 (two) times daily with a meal.      . METOCLOPRAMIDE HCL 10 MG  PO TABS Oral Take 10 mg by mouth 3 (three) times daily before meals.     Marland Kitchen METOPROLOL SUCCINATE ER 25 MG PO TB24 Oral Take 25 mg by mouth 2 (two) times daily.     Marland Kitchen MIRTAZAPINE 30 MG PO TABS Oral Take 30 mg by mouth at bedtime.      . OMEPRAZOLE 20 MG PO CPDR Oral Take 20 mg by mouth 2 (two) times daily. Take 1 po BID x 2 weeks then back down to once a day    . ONDANSETRON 4 MG PO TBDP Oral Take 4 mg by mouth every 8 (eight) hours as needed. Nausea and Vomiting    . OXYMETAZOLINE HCL 0.05 % NA SOLN Nasal Place 2 sprays into the nose 2 (two) times daily. Only to take for 3 days.    Marland Kitchen POLYETHYLENE GLYCOL 3350 PO POWD Oral Take 17 g by mouth daily.      Marland Kitchen SITAGLIPTIN PHOSPHATE 100 MG PO TABS Oral Take 100 mg by mouth daily.     Marland Kitchen SOLIFENACIN SUCCINATE 5 MG PO TABS Oral Take 1 tablet (5 mg total) by mouth daily. 30 tablet 1  . ACETAMINOPHEN 325 MG PO TABS Oral Take 650 mg by mouth every 6  (six) hours as needed. For fever > 101     . ALBUTEROL SULFATE (2.5 MG/3ML) 0.083% IN NEBU Nebulization Take 2.5 mg by nebulization every 2 (two) hours as needed. For wheezing     . INSULIN ASPART 100 UNIT/ML [place] SOLN Subcutaneous Inject 1-6 Units into the skin 3 (three) times daily before meals. Addition to the normal 12 unit dose: If BG 150-200: add 1 unit, 201-250: add 2 units, 251-300: add 3 units, 301-350: add 4 units, 351-400: add 5 units, >400: add 6 units     . LOPERAMIDE HCL 2 MG PO CAPS Oral Take 4 mg by mouth 4 (four) times daily as needed. Take 2 capsules initially then take 2 after each loose stool per MAR     . WARFARIN SODIUM 5 MG PO TABS Oral Take 1 tablet (5 mg total) by mouth one time only at 6 PM. 30 tablet 1  . ZOLPIDEM TARTRATE 10 MG PO TABS Oral Take 1 tablet (10 mg total) by mouth at bedtime as needed. Sleep 30 tablet 1    BP 115/67  Pulse 106  Temp(Src) 98 F (36.7 C) (Oral)  Ht 5\' 9"  (1.753 m)  Wt 175 lb (79.379 kg)  BMI 25.84 kg/m2  SpO2 96%  Physical Exam  Nursing note and vitals reviewed. Constitutional: She is oriented to person, place, and time. She appears well-developed and well-nourished.       Uncomfortable appearing.   HENT:  Head: Normocephalic and atraumatic.  Eyes: EOM are normal. Pupils are equal, round, and reactive to light.  Neck: Neck supple. No tracheal deviation present.  Cardiovascular: Normal rate and regular rhythm.  Exam reveals no gallop and no friction rub.   No murmur heard. Pulmonary/Chest: Effort normal. No respiratory distress. She has no wheezes. She has no rales.  Abdominal: Soft. She exhibits distension. There is no tenderness.  Musculoskeletal: Normal range of motion. She exhibits no edema.       Scoliosis noted.   Neurological: She is alert and oriented to person, place, and time. No sensory deficit.       Chronic left lower leg weakness.   Skin: Skin is warm and dry.  Psychiatric: She has a normal mood and affect. Her  behavior  is normal.    ED Course  Procedures (including critical care time)  DIAGNOSTIC STUDIES: Oxygen Saturation is 96% on room air, adequate by my interpretation.    COORDINATION OF CARE: 2:44PM- Patient informed of current plan for treatment and evaluation and agrees with plan at this time.  Results for orders placed during the hospital encounter of 03/28/11  COMPREHENSIVE METABOLIC PANEL      Component Value Range   Sodium 137  135 - 145 (mEq/L)   Potassium 3.6  3.5 - 5.1 (mEq/L)   Chloride 101  96 - 112 (mEq/L)   CO2 27  19 - 32 (mEq/L)   Glucose, Bld 76  70 - 99 (mg/dL)   BUN 16  6 - 23 (mg/dL)   Creatinine, Ser 1.61  0.50 - 1.10 (mg/dL)   Calcium 9.2  8.4 - 09.6 (mg/dL)   Total Protein 7.3  6.0 - 8.3 (g/dL)   Albumin 3.6  3.5 - 5.2 (g/dL)   AST 11  0 - 37 (U/L)   ALT 11  0 - 35 (U/L)   Alkaline Phosphatase 92  39 - 117 (U/L)   Total Bilirubin 0.3  0.3 - 1.2 (mg/dL)   GFR calc non Af Amer 89 (*) >90 (mL/min)   GFR calc Af Amer >90  >90 (mL/min)  CBC      Component Value Range   WBC 16.0 (*) 4.0 - 10.5 (K/uL)   RBC 4.43  3.87 - 5.11 (MIL/uL)   Hemoglobin 12.1  12.0 - 15.0 (g/dL)   HCT 04.5  40.9 - 81.1 (%)   MCV 83.7  78.0 - 100.0 (fL)   MCH 27.3  26.0 - 34.0 (pg)   MCHC 32.6  30.0 - 36.0 (g/dL)   RDW 91.4 (*) 78.2 - 15.5 (%)   Platelets 384  150 - 400 (K/uL)  DIFFERENTIAL      Component Value Range   Neutrophils Relative 78 (*) 43 - 77 (%)   Neutro Abs 12.5 (*) 1.7 - 7.7 (K/uL)   Lymphocytes Relative 12  12 - 46 (%)   Lymphs Abs 1.9  0.7 - 4.0 (K/uL)   Monocytes Relative 9  3 - 12 (%)   Monocytes Absolute 1.4 (*) 0.1 - 1.0 (K/uL)   Eosinophils Relative 1  0 - 5 (%)   Eosinophils Absolute 0.1  0.0 - 0.7 (K/uL)   Basophils Relative 0  0 - 1 (%)   Basophils Absolute 0.0  0.0 - 0.1 (K/uL)  PROTIME-INR      Component Value Range   Prothrombin Time 17.4 (*) 11.6 - 15.2 (seconds)   INR 1.40  0.00 - 1.49   GLUCOSE, CAPILLARY      Component Value Range    Glucose-Capillary 76  70 - 99 (mg/dL)         MDM  Patient with two weeks of vomiting intermittently that has become more frequent recently. Recent change in pain management for back pain and treatment with antibiotics for a sinus infection have contributed to her symptoms.  Patient given IVF, analgesic, antiemetic with improvement.Labs with leukocytosis however patient is actively being treated for a sinus infection.  She has ambulated in the department. She has had PO fluids and snack .Pt stable in ED with no significant deterioration in condition.The patient appears reasonably screened and/or stabilized for discharge and I doubt any other medical condition or other [facility] requiring further screening, evaluation, or treatment in the ED at this time prior to discharge.  I personally  performed the services described in this documentation, which was scribed in my presence. The recorded information has been reviewed and considered.   MDM Reviewed: nursing note and vitals Interpretation: labs and x-ray         Nicoletta Dress. Colon Branch, MD 03/28/11 [phone]

## 2011-03-28 NOTE — ED Notes (Signed)
Called [name] and gave report to [employer].  They to send someone to pick her up.

## 2011-03-28 NOTE — Discharge Instructions (Signed)
Be sure to take your antibiotic with something on your stomach. Use the nausea medicine as needed. Continue your regular home medicines. Follow up with your doctor.

## 2011-03-28 NOTE — ED Notes (Signed)
Vomiting, weak, says she has been sick since changed her meds

## 2011-03-28 NOTE — ED Notes (Signed)
Pt to lobby to await ride

## 2011-04-17 ENCOUNTER — Ambulatory Visit (INDEPENDENT_AMBULATORY_CARE_PROVIDER_SITE_OTHER): Payer: Medicare Other | Admitting: *Deleted

## 2011-04-17 DIAGNOSIS — I82409 Acute embolism and thrombosis of unspecified deep veins of unspecified lower extremity: Secondary | ICD-10-CM

## 2011-04-17 DIAGNOSIS — Z7901 Long term (current) use of anticoagulants: Secondary | ICD-10-CM

## 2011-04-17 DIAGNOSIS — Z8679 Personal history of other diseases of the circulatory system: Secondary | ICD-10-CM

## 2011-04-17 LAB — POCT INR: INR: 2.7

## 2011-04-21 DIAGNOSIS — E119 Type 2 diabetes mellitus without complications: Secondary | ICD-10-CM | POA: Diagnosis not present

## 2011-04-21 DIAGNOSIS — I82409 Acute embolism and thrombosis of unspecified deep veins of unspecified lower extremity: Secondary | ICD-10-CM | POA: Diagnosis not present

## 2011-04-21 DIAGNOSIS — M549 Dorsalgia, unspecified: Secondary | ICD-10-CM | POA: Diagnosis not present

## 2011-04-21 DIAGNOSIS — I10 Essential (primary) hypertension: Secondary | ICD-10-CM | POA: Diagnosis not present

## 2011-04-26 DIAGNOSIS — E11319 Type 2 diabetes mellitus with unspecified diabetic retinopathy without macular edema: Secondary | ICD-10-CM | POA: Diagnosis not present

## 2011-04-26 DIAGNOSIS — M79609 Pain in unspecified limb: Secondary | ICD-10-CM | POA: Diagnosis not present

## 2011-04-26 DIAGNOSIS — E1139 Type 2 diabetes mellitus with other diabetic ophthalmic complication: Secondary | ICD-10-CM | POA: Diagnosis not present

## 2011-04-26 DIAGNOSIS — B351 Tinea unguium: Secondary | ICD-10-CM | POA: Diagnosis not present

## 2011-05-11 DIAGNOSIS — E119 Type 2 diabetes mellitus without complications: Secondary | ICD-10-CM | POA: Diagnosis not present

## 2011-05-15 ENCOUNTER — Ambulatory Visit (INDEPENDENT_AMBULATORY_CARE_PROVIDER_SITE_OTHER): Payer: Medicare Other | Admitting: *Deleted

## 2011-05-15 DIAGNOSIS — Z8679 Personal history of other diseases of the circulatory system: Secondary | ICD-10-CM | POA: Diagnosis not present

## 2011-05-15 DIAGNOSIS — I82409 Acute embolism and thrombosis of unspecified deep veins of unspecified lower extremity: Secondary | ICD-10-CM

## 2011-05-15 DIAGNOSIS — Z7901 Long term (current) use of anticoagulants: Secondary | ICD-10-CM | POA: Diagnosis not present

## 2011-05-15 LAB — POCT INR: INR: 2.5

## 2011-05-19 DIAGNOSIS — F329 Major depressive disorder, single episode, unspecified: Secondary | ICD-10-CM | POA: Diagnosis not present

## 2011-05-23 ENCOUNTER — Other Ambulatory Visit (HOSPITAL_COMMUNITY): Payer: Self-pay | Admitting: Internal Medicine

## 2011-05-23 DIAGNOSIS — R197 Diarrhea, unspecified: Secondary | ICD-10-CM

## 2011-05-23 DIAGNOSIS — R634 Abnormal weight loss: Secondary | ICD-10-CM | POA: Diagnosis not present

## 2011-05-23 DIAGNOSIS — F410 Panic disorder [episodic paroxysmal anxiety] without agoraphobia: Secondary | ICD-10-CM | POA: Diagnosis not present

## 2011-05-23 DIAGNOSIS — E119 Type 2 diabetes mellitus without complications: Secondary | ICD-10-CM | POA: Diagnosis not present

## 2011-05-25 ENCOUNTER — Emergency Department (HOSPITAL_COMMUNITY)
Admission: EM | Admit: 2011-05-25 | Discharge: 2011-05-25 | Disposition: A | Discharge status: Skilled Nursing Facility | Payer: Medicare Other | Attending: Emergency Medicine | Admitting: Emergency Medicine

## 2011-05-25 ENCOUNTER — Other Ambulatory Visit (HOSPITAL_COMMUNITY): Payer: Self-pay | Admitting: "Endocrinology

## 2011-05-25 ENCOUNTER — Encounter (HOSPITAL_COMMUNITY): Payer: Self-pay | Admitting: Emergency Medicine

## 2011-05-25 ENCOUNTER — Ambulatory Visit (HOSPITAL_COMMUNITY)
Admission: RE | Admit: 2011-05-25 | Discharge: 2011-05-25 | Disposition: A | Discharge status: Home / Self Care | Payer: Medicare Other | Source: Ambulatory Visit | Attending: Internal Medicine | Admitting: Internal Medicine

## 2011-05-25 ENCOUNTER — Encounter (HOSPITAL_COMMUNITY): Payer: Self-pay

## 2011-05-25 ENCOUNTER — Ambulatory Visit (HOSPITAL_COMMUNITY)
Admission: RE | Admit: 2011-05-25 | Discharge: 2011-05-25 | Disposition: A | Discharge status: Home / Self Care | Payer: Medicare Other | Source: Ambulatory Visit | Attending: "Endocrinology | Admitting: "Endocrinology

## 2011-05-25 DIAGNOSIS — R932 Abnormal findings on diagnostic imaging of liver and biliary tract: Secondary | ICD-10-CM | POA: Diagnosis not present

## 2011-05-25 DIAGNOSIS — D1803 Hemangioma of intra-abdominal structures: Secondary | ICD-10-CM | POA: Diagnosis not present

## 2011-05-25 DIAGNOSIS — Z86718 Personal history of other venous thrombosis and embolism: Secondary | ICD-10-CM | POA: Diagnosis not present

## 2011-05-25 DIAGNOSIS — Z8673 Personal history of transient ischemic attack (TIA), and cerebral infarction without residual deficits: Secondary | ICD-10-CM | POA: Insufficient documentation

## 2011-05-25 DIAGNOSIS — F329 Major depressive disorder, single episode, unspecified: Secondary | ICD-10-CM | POA: Insufficient documentation

## 2011-05-25 DIAGNOSIS — Z79899 Other long term (current) drug therapy: Secondary | ICD-10-CM | POA: Diagnosis not present

## 2011-05-25 DIAGNOSIS — F411 Generalized anxiety disorder: Secondary | ICD-10-CM | POA: Insufficient documentation

## 2011-05-25 DIAGNOSIS — K7689 Other specified diseases of liver: Secondary | ICD-10-CM | POA: Diagnosis not present

## 2011-05-25 DIAGNOSIS — R109 Unspecified abdominal pain: Secondary | ICD-10-CM | POA: Insufficient documentation

## 2011-05-25 DIAGNOSIS — F3289 Other specified depressive episodes: Secondary | ICD-10-CM | POA: Insufficient documentation

## 2011-05-25 DIAGNOSIS — Z794 Long term (current) use of insulin: Secondary | ICD-10-CM | POA: Insufficient documentation

## 2011-05-25 DIAGNOSIS — I2510 Atherosclerotic heart disease of native coronary artery without angina pectoris: Secondary | ICD-10-CM | POA: Diagnosis not present

## 2011-05-25 DIAGNOSIS — I10 Essential (primary) hypertension: Secondary | ICD-10-CM

## 2011-05-25 DIAGNOSIS — I517 Cardiomegaly: Secondary | ICD-10-CM | POA: Diagnosis not present

## 2011-05-25 DIAGNOSIS — R197 Diarrhea, unspecified: Secondary | ICD-10-CM | POA: Insufficient documentation

## 2011-05-25 DIAGNOSIS — E119 Type 2 diabetes mellitus without complications: Secondary | ICD-10-CM | POA: Insufficient documentation

## 2011-05-25 DIAGNOSIS — Z7901 Long term (current) use of anticoagulants: Secondary | ICD-10-CM | POA: Diagnosis not present

## 2011-05-25 DIAGNOSIS — F419 Anxiety disorder, unspecified: Secondary | ICD-10-CM

## 2011-05-25 LAB — COMPREHENSIVE METABOLIC PANEL
ALT: 9 U/L (ref 0–35)
AST: 13 U/L (ref 0–37)
Albumin: 3.9 g/dL (ref 3.5–5.2)
Alkaline Phosphatase: 94 U/L (ref 39–117)
BUN: 15 mg/dL (ref 6–23)
CO2: 27 mEq/L (ref 19–32)
Calcium: 9.7 mg/dL (ref 8.4–10.5)
Chloride: 95 mEq/L — ABNORMAL LOW (ref 96–112)
Creatinine, Ser: 0.71 mg/dL (ref 0.50–1.10)
GFR calc Af Amer: >90 mL/min (ref >90)
GFR calc non Af Amer: 89 mL/min — ABNORMAL LOW (ref >90)
Glucose, Bld: 128 mg/dL — ABNORMAL HIGH (ref 70–99)
Potassium: 4.1 mEq/L (ref 3.5–5.1)
Sodium: 135 mEq/L (ref 135–145)
Total Bilirubin: 0.2 mg/dL — ABNORMAL LOW (ref 0.3–1.2)
Total Protein: 7.9 g/dL (ref 6.0–8.3)

## 2011-05-25 LAB — DIFFERENTIAL
Basophils Absolute: 0 10*3/uL (ref 0.0–0.1)
Basophils Relative: 0 % (ref 0–1)
Eosinophils Absolute: 0.1 10*3/uL (ref 0.0–0.7)
Eosinophils Relative: 1 % (ref 0–5)
Lymphocytes Relative: 20 % (ref 12–46)
Lymphs Abs: 2.4 10*3/uL (ref 0.7–4.0)
Monocytes Absolute: 0.9 10*3/uL (ref 0.1–1.0)
Monocytes Relative: 7 % (ref 3–12)
Neutro Abs: 8.8 10*3/uL — ABNORMAL HIGH (ref 1.7–7.7)
Neutrophils Relative %: 72 % (ref 43–77)

## 2011-05-25 LAB — URINALYSIS, ROUTINE W REFLEX MICROSCOPIC
Bilirubin Urine: NEGATIVE
Glucose, UA: NEGATIVE mg/dL
Hgb urine dipstick: NEGATIVE
Ketones, ur: NEGATIVE mg/dL
Leukocytes, UA: NEGATIVE
Nitrite: NEGATIVE
Specific Gravity, Urine: 1.02 (ref 1.005–1.030)
Urobilinogen, UA: 0.2 mg/dL (ref 0.0–1.0)
pH: 7 (ref 5.0–8.0)

## 2011-05-25 LAB — CBC
HCT: 37.9 % (ref 36.0–46.0)
Hemoglobin: 12.3 g/dL (ref 12.0–15.0)
MCH: 26.8 pg (ref 26.0–34.0)
MCHC: 32.5 g/dL (ref 30.0–36.0)
MCV: 82.6 fL (ref 78.0–100.0)
Platelets: 404 10*3/uL — ABNORMAL HIGH (ref 150–400)
RBC: 4.59 MIL/uL (ref 3.87–5.11)
RDW: 14.9 % (ref 11.5–15.5)
WBC: 12.2 10*3/uL — ABNORMAL HIGH (ref 4.0–10.5)

## 2011-05-25 LAB — ETHANOL: Alcohol, Ethyl (B): <11 mg/dL (ref 0–11)

## 2011-05-25 LAB — RAPID URINE DRUG SCREEN, HOSP PERFORMED
Amphetamines: NOT DETECTED
Barbiturates: NOT DETECTED
Benzodiazepines: NOT DETECTED
Cocaine: NOT DETECTED
Opiates: POSITIVE — AB
Tetrahydrocannabinol: NOT DETECTED

## 2011-05-25 LAB — URINE MICROSCOPIC-ADD ON

## 2011-05-25 LAB — PROTIME-INR
INR: 2.34 — ABNORMAL HIGH (ref 0.00–1.49)
Prothrombin Time: 26 seconds — ABNORMAL HIGH (ref 11.6–15.2)

## 2011-05-25 MED ORDER — WARFARIN SODIUM 2.5 MG PO TABS
2.5000 mg | ORAL_TABLET | ORAL | Status: DC
  Filled 2011-05-25: qty 1

## 2011-05-25 MED ORDER — LINAGLIPTIN 5 MG PO TABS
5.0000 mg | ORAL_TABLET | Freq: Every day | ORAL | Status: DC
  Filled 2011-05-25 (×2): qty 1

## 2011-05-25 MED ORDER — WARFARIN - PHYSICIAN DOSING INPATIENT: Freq: Every day | Status: DC

## 2011-05-25 MED ORDER — LORAZEPAM 1 MG PO TABS
1.0000 mg | ORAL_TABLET | Freq: Three times a day (TID) | ORAL | Status: DC
  Administered 2011-05-25: 1 mg via ORAL
  Filled 2011-05-25: qty 1

## 2011-05-25 MED ORDER — POLYETHYLENE GLYCOL 3350 17 GM/SCOOP PO POWD
17.0000 g | Freq: Every day | ORAL | Status: DC
Start: 2011-05-25 — End: 2011-05-25

## 2011-05-25 MED ORDER — METOCLOPRAMIDE HCL 10 MG PO TABS: 10.0000 mg | ORAL_TABLET | Freq: Three times a day (TID) | ORAL | Status: DC

## 2011-05-25 MED ORDER — HYDROCODONE-ACETAMINOPHEN 5-325 MG PO TABS
1.0000 | ORAL_TABLET | Freq: Four times a day (QID) | ORAL | Status: DC
  Administered 2011-05-25: 1 via ORAL
  Filled 2011-05-25: qty 1

## 2011-05-25 MED ORDER — METFORMIN HCL 500 MG PO TABS: 500.0000 mg | ORAL_TABLET | Freq: Two times a day (BID) | ORAL | Status: DC

## 2011-05-25 MED ORDER — ACETAMINOPHEN 325 MG PO TABS: 650.0000 mg | ORAL_TABLET | ORAL | Status: DC | PRN

## 2011-05-25 MED ORDER — ONDANSETRON HCL 4 MG PO TABS: 4.0000 mg | ORAL_TABLET | Freq: Three times a day (TID) | ORAL | Status: DC | PRN

## 2011-05-25 MED ORDER — ALUM & MAG HYDROXIDE-SIMETH 200-200-20 MG/5ML PO SUSP: 30.0000 mL | ORAL | Status: DC | PRN

## 2011-05-25 MED ORDER — ZOLPIDEM TARTRATE 5 MG PO TABS: 10.0000 mg | ORAL_TABLET | Freq: Every evening | ORAL | Status: DC | PRN

## 2011-05-25 MED ORDER — INSULIN ASPART 100 UNIT/ML ~~LOC~~ SOLN: 12.0000 [IU] | Freq: Three times a day (TID) | SUBCUTANEOUS | Status: DC

## 2011-05-25 MED ORDER — ENALAPRIL MALEATE 5 MG PO TABS
5.0000 mg | ORAL_TABLET | ORAL | Status: DC
  Filled 2011-05-25: qty 1

## 2011-05-25 MED ORDER — WARFARIN SODIUM 5 MG PO TABS
5.0000 mg | ORAL_TABLET | Freq: Every day | ORAL | Status: DC
  Filled 2011-05-25: qty 1

## 2011-05-25 MED ORDER — INSULIN ASPART 100 UNIT/ML ~~LOC~~ SOLN: 1.0000 [IU] | Freq: Three times a day (TID) | SUBCUTANEOUS | Status: DC

## 2011-05-25 MED ORDER — CLONAZEPAM 0.5 MG PO TABS: 1.0000 mg | ORAL_TABLET | Freq: Three times a day (TID) | ORAL | Status: DC

## 2011-05-25 MED ORDER — PANTOPRAZOLE SODIUM 40 MG PO TBEC
40.0000 mg | DELAYED_RELEASE_TABLET | Freq: Every day | ORAL | Status: DC
  Administered 2011-05-25: 40 mg via ORAL
  Filled 2011-05-25: qty 1

## 2011-05-25 MED ORDER — DULOXETINE HCL 60 MG PO CPEP
120.0000 mg | ORAL_CAPSULE | Freq: Every day | ORAL | Status: DC
  Filled 2011-05-25 (×2): qty 2

## 2011-05-25 MED ORDER — MIRTAZAPINE 30 MG PO TABS
30.0000 mg | ORAL_TABLET | Freq: Every day | ORAL | Status: DC
  Filled 2011-05-25: qty 1

## 2011-05-25 MED ORDER — TIZANIDINE HCL 4 MG PO TABS
2.0000 mg | ORAL_TABLET | Freq: Every day | ORAL | Status: DC
Start: 2011-05-25 — End: 2011-05-25
  Filled 2011-05-25 (×2): qty 1

## 2011-05-25 MED ORDER — POLYETHYLENE GLYCOL 3350 17 G PO PACK
17.0000 g | PACK | Freq: Every day | ORAL | Status: DC
  Filled 2011-05-25: qty 1

## 2011-05-25 MED ORDER — IPRATROPIUM BROMIDE 0.02 % IN SOLN: 500.0000 ug | Freq: Four times a day (QID) | RESPIRATORY_TRACT | Status: DC | PRN

## 2011-05-25 MED ORDER — ARIPIPRAZOLE 2 MG PO TABS: 2.0000 mg | ORAL_TABLET | Freq: Every day | ORAL | Status: DC

## 2011-05-25 MED ORDER — LOPERAMIDE HCL 2 MG PO CAPS: 4.0000 mg | ORAL_CAPSULE | ORAL | Status: DC | PRN

## 2011-05-25 MED ORDER — ALBUTEROL SULFATE (5 MG/ML) 0.5% IN NEBU: 2.5000 mg | INHALATION_SOLUTION | Freq: Four times a day (QID) | RESPIRATORY_TRACT | Status: DC | PRN

## 2011-05-25 MED ORDER — METOPROLOL SUCCINATE ER 25 MG PO TB24
25.0000 mg | ORAL_TABLET | Freq: Two times a day (BID) | ORAL | Status: DC
  Filled 2011-05-25 (×3): qty 1

## 2011-05-25 MED ORDER — INSULIN GLARGINE 100 UNIT/ML ~~LOC~~ SOLN: 75.0000 [IU] | Freq: Every day | SUBCUTANEOUS | Status: DC

## 2011-05-25 MED ORDER — IBUPROFEN 400 MG PO TABS: 600.0000 mg | ORAL_TABLET | Freq: Three times a day (TID) | ORAL | Status: DC | PRN

## 2011-05-25 NOTE — ED Notes (Signed)
Tele-psych consult completed.   

## 2011-05-25 NOTE — ED Provider Notes (Signed)
History     CSN: [id number]  Arrival date & time 05/25/11  1128   First MD Initiated Contact with Patient 05/25/11 1139      Chief Complaint  Patient presents with  . Anxiety    (Consider location/radiation/quality/duration/timing/severity/associated sxs/prior treatment) HPI  Past Medical History  Diagnosis Date  . DVT (deep venous thrombosis)   . Diabetes mellitus   . Anxiety   . Depression   . Hemiplegia   . GERD (gastroesophageal reflux disease)   . Stroke   . Shortness of breath   . Coronary artery disease   . Arthritis   . Anxiety   . Hypertension     Past Surgical History  Procedure Date  . Back surgery     No family history on file.  History  Substance Use Topics  . Smoking status: Never Smoker   . Smokeless tobacco: Not on file  . Alcohol Use: No    OB History    Grav Para Term Preterm Abortions TAB SAB Ect Mult Living                  Review of Systems  Allergies  Sulfa antibiotics and Sulfonamide derivatives  Home Medications   Current Outpatient Rx  Name Route Sig Dispense Refill  . DULOXETINE HCL 60 MG PO CPEP Oral Take 120 mg by mouth daily.     . ENALAPRIL MALEATE 5 MG PO TABS Oral Take 5 mg by mouth every morning.     Marland Kitchen HYDROCODONE-ACETAMINOPHEN 5-325 MG PO TABS Oral Take 1 tablet by mouth 4 (four) times daily.    . INSULIN ASPART 100 UNIT/ML [place] SOLN Subcutaneous Inject 12 Units into the skin 3 (three) times daily before meals. Hold if blood sugar is less than 100    . INSULIN ASPART 100 UNIT/ML [place] SOLN Subcutaneous Inject 1-6 Units into the skin 3 (three) times daily before meals. Addition to the normal 12 unit dose: If BG 150-200: add 1 unit, 201-250: add 2 units, 251-300: add 3 units, 301-350: add 4 units, 351-400: add 5 units, >400: add 6 units     . INSULIN GLARGINE 100 UNIT/ML [place] SOLN Subcutaneous Inject 75 Units into the skin at bedtime.     Marland Kitchen LORAZEPAM 1 MG PO TABS Oral Take 1 mg by mouth 3 (three) times daily.    Marland Kitchen METFORMIN  HCL 500 MG PO TABS Oral Take 500 mg by mouth 2 (two) times daily with a meal.      . METOCLOPRAMIDE HCL 10 MG PO TABS Oral Take 10 mg by mouth 3 (three) times daily before meals.     Marland Kitchen METOPROLOL SUCCINATE ER 25 MG PO TB24 Oral Take 25 mg by mouth 2 (two) times daily.     Marland Kitchen MIRTAZAPINE 30 MG PO TABS Oral Take 30 mg by mouth at bedtime.      . OMEPRAZOLE 20 MG PO CPDR Oral Take 20 mg by mouth 2 (two) times daily. Take 1 po BID x 2 weeks then back down to once a day    . POLYETHYLENE GLYCOL 3350 PO POWD Oral Take 17 g by mouth daily.      Marland Kitchen SITAGLIPTIN PHOSPHATE 100 MG PO TABS Oral Take 100 mg by mouth daily.     Marland Kitchen TIZANIDINE HCL 2 MG PO TABS Oral Take 2 mg by mouth daily.    . WARFARIN SODIUM 2.5 MG PO TABS Oral Take 2.5 mg by mouth daily. Patient takes on Monday and Thursday    .  WARFARIN SODIUM 5 MG PO TABS Oral Take 5 mg by mouth daily. Tuesday,Wednesday,Friday,Saturday,Sunday    . ACETAMINOPHEN 325 MG PO TABS Oral Take 650 mg by mouth every 6 (six) hours as needed. For fever > 101     . ALBUTEROL SULFATE (2.5 MG/3ML) 0.083% IN NEBU Nebulization Take 2.5 mg by nebulization every 2 (two) hours as needed. For wheezing     . ARIPIPRAZOLE 2 MG PO TABS Oral Take 1 tablet (2 mg total) by mouth daily. 30 tablet 0  . CLONAZEPAM 0.5 MG PO TABS Oral Take 2 tablets (1 mg total) by mouth 3 (three) times daily. 90 tablet 0  . IPRATROPIUM BROMIDE 0.02 % IN SOLN Nebulization Take 500 mcg by nebulization 4 (four) times daily as needed. Shortness of breath    . LOPERAMIDE HCL 2 MG PO CAPS Oral Take 4 mg by mouth 4 (four) times daily as needed. Take 2 capsules initially then take 2 after each loose stool per MAR     . ONDANSETRON 4 MG PO TBDP Oral Take 4 mg by mouth every 8 (eight) hours as needed. Nausea and Vomiting    . ZOLPIDEM TARTRATE 10 MG PO TABS Oral Take 1 tablet (10 mg total) by mouth at bedtime as needed. Sleep 30 tablet 1    BP 111/67  Pulse 101  Temp 98.3 F (36.8 C)  Resp 20  Ht 5\' 6"  (1.676  m)  Wt 154 lb (69.854 kg)  BMI 24.86 kg/m2  SpO2 98%  Physical Exam  ED Course  Procedures (including critical care time)  Labs Reviewed  CBC - Abnormal; Notable for the following:    WBC 12.2 (*)    Platelets 404 (*)    All other components within normal limits  DIFFERENTIAL - Abnormal; Notable for the following:    Neutro Abs 8.8 (*)    All other components within normal limits  COMPREHENSIVE METABOLIC PANEL - Abnormal; Notable for the following:    Chloride 95 (*)    Glucose, Bld 128 (*)    Total Bilirubin 0.2 (*)    GFR calc non Af Amer 89 (*)    All other components within normal limits  URINALYSIS, ROUTINE W REFLEX MICROSCOPIC - Abnormal; Notable for the following:    Protein, ur TRACE (*)    All other components within normal limits  URINE RAPID DRUG SCREEN (HOSP PERFORMED) - Abnormal; Notable for the following:    Opiates POSITIVE (*)    All other components within normal limits  PROTIME-INR - Abnormal; Notable for the following:    Prothrombin Time 26.0 (*)    INR 2.34 (*)    All other components within normal limits  URINE MICROSCOPIC-ADD ON - Abnormal; Notable for the following:    Squamous Epithelial / LPF FEW (*)    Bacteria, UA FEW (*)    All other components within normal limits  ETHANOL   Dg Chest 2 View  05/25/2011  *RADIOLOGY REPORT*  Clinical Data: Hypertension  CHEST - 2 VIEW  Comparison: Chest radiograph 12/27/2010 and 09/22/2010  Findings: Stable mild cardiomegaly.  Thoracic aorta and hilar contours and pulmonary vascularity are within normal limits.  The lungs are normally expanded and clear.  There is no pleural effusion.  No acute bony abnormality is identified.  IMPRESSION: No acute cardiopulmonary disease.  Stable mild cardiomegaly.  Original Report Authenticated By: Britta Mccreedy, M.D.   US Abdomen Complete  05/25/2011  *RADIOLOGY REPORT*  Clinical Data:  Abdominal pain.  Diarrhea.  ABDOMINAL ULTRASOUND COMPLETE  Comparison:  Ultrasound 02/24/2010  and CT on 02/23/2010  Findings:  Gallbladder:  A 1 cm echogenic focus is seen in the gallbladder neck which shows no acoustic shadowing and is non mobile with changes in patient positioning.  These features are suspicious for a gallbladder polyp, as opposed to a gallstone.  There is no evidence of gallbladder wall thickening or pericholecystic fluid.  Common Bile Duct:  Within normal limits in caliber.  Liver: Mildly increased parenchymal echogenicity, consistent with hepatic steatosis.  3 cm mass again seen in the peripheral right hepatic lobe, corresponds with benign hemangioma as demonstrated on prior CT.  No other liver masses are identified.  IVC:  Appears normal.  Pancreas:  No abnormality identified.  Spleen:  Within normal limits in size and echotexture.  Right kidney:  Normal in size and parenchymal echogenicity.  No evidence of mass or hydronephrosis.  Left kidney:  Normal in size and parenchymal echogenicity.  No evidence of mass or hydronephrosis.  Abdominal Aorta:  No aneurysm identified.  IMPRESSION:  1.  Probable 1 cm gallbladder polyp, without significant change compared to prior exam.  No sonographic signs of acute cholecystitis or biliary dilatation. 2.  Hepatic steatosis. 3.  Stable 3 cm right hepatic lobe hemangioma.  Original Report Authenticated By: Danae Orleans, M.D.     1. Anxiety       MDM  Tele psychiatry consult obtained.  I will initiate the recommendations of the psychiatrist as follows:  Klonopin 1 mg 3 times a day and Abilify 2 mg daily        Donnetta Hutching, MD 05/25/11 1611

## 2011-05-25 NOTE — ED Notes (Signed)
Called [facility], spoke with Dondra Spry, LPN who reports that patient had medication changes in Late February, early March. Klonopin was changed to Ativan due to insurance coverage issues, and Flexeril was changed to Baclofen. LPN reports patient has been having "outbursts" describes patient as "lashing out verbally at staff and other residents". LPN denies physical "outbursts". Reporting weight loss and increased anxiety. Staff has spoken with patient's psychiatrist at [facility] who does not want to change medications. Reported that patient is here to speak to "crisis counselor"

## 2011-05-25 NOTE — ED Notes (Signed)
Dr.Knapp at bedside  

## 2011-05-25 NOTE — ED Notes (Signed)
Pt states she is here for anxiety. Pt states he anxiety medication was changed 03/24/11 and it has been causing her to act out at her assisted living facility [facility]). Pt seen by pcp on Friday.

## 2011-05-25 NOTE — ED Notes (Signed)
Patient with no complaints at this time. Respirations even and unlabored. Skin warm/dry. Discharge instructions reviewed with patient at this time. Patient given opportunity to voice concerns/ask questions. Patient discharged at this time and left Emergency Department with steady gait.   

## 2011-05-25 NOTE — ED Provider Notes (Signed)
History    This chart was scribed for Ward Givens, MD, MD by Smitty Pluck. The patient was seen in room APA15 and the patient's care was started at 12:16PM.   CSN: [id number]  Arrival date & time 05/25/11  1128   First MD Initiated Contact with Patient 05/25/11 1139      Chief Complaint  Patient presents with  . Anxiety    (Consider location/radiation/quality/duration/timing/severity/associated sxs/prior treatment) Patient is a 66 y.o. female presenting with anxiety.  Anxiety   [name] is a 66 y.o. female who presents to the Emergency Department from complaining of anxiety. Pt reports that she has decreased appetite and has lost 18 lbs within last 2 month . DSS is her legal guardian. Pt was taking ambilify and on 03/03/11 it was discontinued by psychiatrist, Dr Geanie Cooley due to pt eating well and no mental issues reported. Pt was taking klonopin  0.5 mg but on 03/24/11 medicaid stopped paying for it and her medication was changed to adivan 1 mg. Since medication change she has threaten other patients, screaming and acting out at living facility. She went to [facility] on 3/26 to see Dr Geanie Cooley and she refused for staff member to go back with her for evaluation. Pt was recently seen at PCP in the past week for weight loss. Pt had chest xray today at [facility] ordered by Dr. [name] and CT scan of abdomen 1 week ago and has had blood work done. Director of her faciility is here with patient and states "We want an evluation by Tommy" meaning ACT. I have advised them he does not make medication recommendations.   PCP is Dr.[name]  Past Medical History  Diagnosis Date  . DVT (deep venous thrombosis)   . Diabetes mellitus   . Anxiety   . Depression   . Hemiplegia   . GERD (gastroesophageal reflux disease)   . Stroke   . Shortness of breath   . Coronary artery disease   . Arthritis   . Anxiety   . Hypertension     Past Surgical History  Procedure Date  . Back surgery     No family history  on file.  History  Substance Use Topics  . Smoking status: Never Smoker   . Smokeless tobacco: Not on file  . Alcohol Use: No  lives in assisted living  OB History    Grav Para Term Preterm Abortions TAB SAB Ect Mult Living                  Review of Systems  All other systems reviewed and are negative.   10 Systems reviewed and all are negative for acute change except as noted in the HPI.   Allergies  Sulfa antibiotics and Sulfonamide derivatives  Home Medications   Patient's Medications  New Prescriptions   No medications on file  Previous Medications   ACETAMINOPHEN (TYLENOL) 325 MG TABLET    Take 650 mg by mouth every 6 (six) hours as needed. For fever > 101    ALBUTEROL (PROVENTIL) (2.5 MG/3ML) 0.083% NEBULIZER SOLUTION    Take 2.5 mg by nebulization every 2 (two) hours as needed. For wheezing    BACLOFEN (LIORESAL) 10 MG TABLET    Take 15 mg by mouth 3 (three) times daily.   DULOXETINE (CYMBALTA) 60 MG CAPSULE    Take 120 mg by mouth daily.    ENALAPRIL (VASOTEC) 5 MG TABLET    Take 5 mg by mouth every morning.  HYDROCODONE-ACETAMINOPHEN (NORCO) 5-325 MG PER TABLET    Take 1 tablet by mouth 4 (four) times daily.    INSULIN ASPART (NOVOLOG) 100 UNIT/ML INJECTION    Inject 12 Units into the skin 3 (three) times daily before meals. Hold if blood sugar is less than 100   INSULIN ASPART (NOVOLOG) 100 UNIT/ML INJECTION    Inject 1-6 Units into the skin 3 (three) times daily before meals. Addition to the normal 12 unit dose: If BG 150-200: add 1 unit, 201-250: add 2 units, 251-300: add 3 units, 301-350: add 4 units, 351-400: add 5 units, >400: add 6 units    INSULIN GLARGINE (LANTUS) 100 UNIT/ML INJECTION    Inject 75 Units into the skin at bedtime.    IPRATROPIUM (ATROVENT) 0.02 % NEBULIZER SOLUTION    Take 500 mcg by nebulization 4 (four) times daily as needed. Shortness of breath   LOPERAMIDE (IMODIUM) 2 MG CAPSULE    Take 4 mg by mouth 4 (four) times daily as needed.  Take 2 capsules initially then take 2 after each loose stool per MAR    LORATADINE (CLARITIN) 10 MG TABLET    Take 10 mg by mouth daily. To take for 10 days.   LORAZEPAM (ATIVAN) 1 MG TABLET    Take 1 mg by mouth 3 (three) times daily.   METFORMIN (GLUCOPHAGE) 500 MG TABLET    Take 500 mg by mouth 2 (two) times daily with a meal.     METOCLOPRAMIDE (REGLAN) 10 MG TABLET    Take 10 mg by mouth 3 (three) times daily before meals.    METOPROLOL SUCCINATE (TOPROL-XL) 25 MG 24 HR TABLET    Take 25 mg by mouth 2 (two) times daily.    MIRTAZAPINE (REMERON) 30 MG TABLET    Take 30 mg by mouth at bedtime.     OMEPRAZOLE (PRILOSEC) 20 MG CAPSULE    Take 20 mg by mouth 2 (two) times daily. Take 1 po BID x 2 weeks then back down to once a day   ONDANSETRON (ZOFRAN-ODT) 4 MG DISINTEGRATING TABLET    Take 4 mg by mouth every 8 (eight) hours as needed. Nausea and Vomiting   OXYMETAZOLINE (AFRIN) 0.05 % NASAL SPRAY    Place 2 sprays into the nose 2 (two) times daily. Only to take for 3 days.   POLYETHYLENE GLYCOL POWDER (GLYCOLAX/MIRALAX) POWDER    Take 17 g by mouth daily.     SITAGLIPTIN (JANUVIA) 100 MG TABLET    Take 100 mg by mouth daily.    SOLIFENACIN (VESICARE) 5 MG TABLET    Take 1 tablet (5 mg total) by mouth daily.   WARFARIN (COUMADIN) 5 MG TABLET    Take 1 tablet (5 mg total) by mouth one time only at 6 PM.   ZOLPIDEM (AMBIEN) 10 MG TABLET    Take 1 tablet (10 mg total) by mouth at bedtime as needed. Sleep  Modified Medications   No medications on file  Discontinued Medications   No medications on file      Ht 5\' 6"  (1.676 m)  Wt 154 lb (69.854 kg)  BMI 24.86 kg/m2, BP 115/67, pulse 106, resp 16, temp 98.0  Vital signs normal except tachycardia  Physical Exam  Nursing note and vitals reviewed. Constitutional: She is oriented to person, place, and time. She appears well-developed and well-nourished. No distress.  HENT:  Head: Normocephalic and atraumatic.  Right Ear: External ear  normal.  Left Ear: External ear normal.  Nose: Nose normal.       Dry tongue, edentulous  Eyes: Conjunctivae are normal. Pupils are equal, round, and reactive to light.  Neck: Normal range of motion. Neck supple.  Cardiovascular: Normal rate, regular rhythm, normal heart sounds and intact distal pulses.   Pulmonary/Chest: Effort normal and breath sounds normal. No respiratory distress. She has no wheezes. She has no rales. She exhibits no tenderness.  Abdominal: Soft. Bowel sounds are normal. She exhibits no distension. There is no tenderness. There is no rebound and no guarding.  Musculoskeletal: Normal range of motion.  Neurological: She is alert and oriented to person, place, and time.  Skin: Skin is warm and dry.  Psychiatric: She has a normal mood and affect. Her behavior is normal.    ED Course  Procedures (including critical care time)  13:46 patient discussed with Dr. Geanie Cooley her psychiatrist at [facility]. He relates her Klonopin was changed to Ativan by Dr. [name].He also states her abilify was decreased from 5 mg a day to 2.5 mg a day by Dr [name] and was not stopped by him. He states he has her on [place] which she needs nightly. He feels she can continue on the abilify and doesn't object to it being restarted at 5 mg. Patients facility director told me Dr Geanie Cooley refused to put her back on the abilify.   Telepsych consult ordered.   14:56 Ella, states no ACT coverage until 5:30 pm today.  COORDINATION OF CARE: 12:22PM EDP discusses pt ED treatment course with pt. 2:04PM EDP recheck pt. EDP discusses pt medications with pt. EDP will contact Child psychotherapist.   Results for orders placed during the hospital encounter of 05/25/11  CBC      Component Value Range   WBC 12.2 (*) 4.0 - 10.5 (K/uL)   RBC 4.59  3.87 - 5.11 (MIL/uL)   Hemoglobin 12.3  12.0 - 15.0 (g/dL)   HCT 16.1  09.6 - 04.5 (%)   MCV 82.6  78.0 - 100.0 (fL)   MCH 26.8  26.0 - 34.0 (pg)   MCHC 32.5  30.0 - 36.0 (g/dL)   RDW  40.9  81.1 - 91.4 (%)   Platelets 404 (*) 150 - 400 (K/uL)  DIFFERENTIAL      Component Value Range   Neutrophils Relative 72  43 - 77 (%)   Neutro Abs 8.8 (*) 1.7 - 7.7 (K/uL)   Lymphocytes Relative 20  12 - 46 (%)   Lymphs Abs 2.4  0.7 - 4.0 (K/uL)   Monocytes Relative 7  3 - 12 (%)   Monocytes Absolute 0.9  0.1 - 1.0 (K/uL)   Eosinophils Relative 1  0 - 5 (%)   Eosinophils Absolute 0.1  0.0 - 0.7 (K/uL)   Basophils Relative 0  0 - 1 (%)   Basophils Absolute 0.0  0.0 - 0.1 (K/uL)  COMPREHENSIVE METABOLIC PANEL      Component Value Range   Sodium 135  135 - 145 (mEq/L)   Potassium 4.1  3.5 - 5.1 (mEq/L)   Chloride 95 (*) 96 - 112 (mEq/L)   CO2 27  19 - 32 (mEq/L)   Glucose, Bld 128 (*) 70 - 99 (mg/dL)   BUN 15  6 - 23 (mg/dL)   Creatinine, Ser 7.82  0.50 - 1.10 (mg/dL)   Calcium 9.7  8.4 - 95.6 (mg/dL)   Total Protein 7.9  6.0 - 8.3 (g/dL)   Albumin 3.9  3.5 - 5.2 (g/dL)   AST 13  0 - 37 (U/L)   ALT 9  0 - 35 (U/L)   Alkaline Phosphatase 94  39 - 117 (U/L)   Total Bilirubin 0.2 (*) 0.3 - 1.2 (mg/dL)   GFR calc non Af Amer 89 (*) >90 (mL/min)   GFR calc Af Amer >90  >90 (mL/min)  ETHANOL      Component Value Range   Alcohol, Ethyl (B) <11  0 - 11 (mg/dL)  URINALYSIS, ROUTINE W REFLEX MICROSCOPIC      Component Value Range   Color, Urine YELLOW  YELLOW    APPearance CLEAR  CLEAR    Specific Gravity, Urine 1.020  1.005 - 1.030    pH 7.0  5.0 - 8.0    Glucose, UA NEGATIVE  NEGATIVE (mg/dL)   Hgb urine dipstick NEGATIVE  NEGATIVE    Bilirubin Urine NEGATIVE  NEGATIVE    Ketones, ur NEGATIVE  NEGATIVE (mg/dL)   Protein, ur TRACE (*) NEGATIVE (mg/dL)   Urobilinogen, UA 0.2  0.0 - 1.0 (mg/dL)   Nitrite NEGATIVE  NEGATIVE    Leukocytes, UA NEGATIVE  NEGATIVE   PROTIME-INR      Component Value Range   Prothrombin Time 26.0 (*) 11.6 - 15.2 (seconds)   INR 2.34 (*) 0.00 - 1.49   URINE MICROSCOPIC-ADD ON      Component Value Range   Squamous Epithelial / LPF FEW (*) RARE      WBC, UA 3-6  <3 (WBC/hpf)   RBC / HPF 0-2  <3 (RBC/hpf)   Bacteria, UA FEW (*) RARE    Urine-Other MUCOUS PRESENT     Laboratory interpretation all normal except therapeutic INR, improving leukocytosis   Dg Chest 2 View  05/25/2011  *RADIOLOGY REPORT*  Clinical Data: Hypertension  CHEST - 2 VIEW  Comparison: Chest radiograph 12/27/2010 and 09/22/2010  Findings: Stable mild cardiomegaly.  Thoracic aorta and hilar contours and pulmonary vascularity are within normal limits.  The lungs are normally expanded and clear.  There is no pleural effusion.  No acute bony abnormality is identified.  IMPRESSION: No acute cardiopulmonary disease.  Stable mild cardiomegaly.  Original Report Authenticated By: Britta Mccreedy, M.D.   US Abdomen Complete  05/25/2011  *RADIOLOGY REPORT*  Clinical Data:  Abdominal pain.  Diarrhea.  ABDOMINAL ULTRASOUND COMPLETE  Comparison:  Ultrasound 02/24/2010 and CT on 02/23/2010  Findings:  Gallbladder:  A 1 cm echogenic focus is seen in the gallbladder neck which shows no acoustic shadowing and is non mobile with changes in patient positioning.  These features are suspicious for a gallbladder polyp, as opposed to a gallstone.  There is no evidence of gallbladder wall thickening or pericholecystic fluid.  Common Bile Duct:  Within normal limits in caliber.  Liver: Mildly increased parenchymal echogenicity, consistent with hepatic steatosis.  3 cm mass again seen in the peripheral right hepatic lobe, corresponds with benign hemangioma as demonstrated on prior CT.  No other liver masses are identified.  IVC:  Appears normal.  Pancreas:  No abnormality identified.  Spleen:  Within normal limits in size and echotexture.  Right kidney:  Normal in size and parenchymal echogenicity.  No evidence of mass or hydronephrosis.  Left kidney:  Normal in size and parenchymal echogenicity.  No evidence of mass or hydronephrosis.  Abdominal Aorta:  No aneurysm identified.  IMPRESSION:  1.  Probable 1 cm  gallbladder polyp, without significant change compared to prior exam.  No sonographic signs of acute cholecystitis or biliary dilatation. 2.  Hepatic steatosis. 3.  Stable 3 cm right hepatic lobe hemangioma.  Original Report Authenticated By: Danae Orleans, M.D.     1. Anxiety    Plan per telepsych recommendation  Devoria Albe, MD, FACEP    MDM    I personally performed the services described in this documentation, which was scribed in my presence. The recorded information has been reviewed and considered. Devoria Albe, MD, FACEP        Ward Givens, MD 05/25/11 [phone]

## 2011-05-25 NOTE — ED Notes (Signed)
Paperwork gathered for [employer] consult and faxed to appropriate place. Awaiting Psychiatrist to call.

## 2011-05-25 NOTE — ED Notes (Signed)
Tammy, Director of [employer] given report of patient discharge as well as Rosey Bath, [employer]. Tammy gave report to [facility], LPN on phone during time of discharge as well. Patient left ED with Tammy, Interior and spatial designer and Rosey Bath from [place].

## 2011-05-25 NOTE — Discharge Instructions (Signed)
We will initiate the recommendations from the psychiatrist..  Her recommendations were for Klonopin 1 mg 3 times a day and Abilify 2 mg daily.  Followup your primary care doctor or psychiatrist

## 2011-05-29 DIAGNOSIS — I6789 Other cerebrovascular disease: Secondary | ICD-10-CM | POA: Diagnosis not present

## 2011-05-29 DIAGNOSIS — E119 Type 2 diabetes mellitus without complications: Secondary | ICD-10-CM | POA: Diagnosis not present

## 2011-06-12 ENCOUNTER — Ambulatory Visit (INDEPENDENT_AMBULATORY_CARE_PROVIDER_SITE_OTHER): Payer: Medicare Other | Admitting: *Deleted

## 2011-06-12 DIAGNOSIS — I82409 Acute embolism and thrombosis of unspecified deep veins of unspecified lower extremity: Secondary | ICD-10-CM | POA: Diagnosis not present

## 2011-06-12 DIAGNOSIS — Z7901 Long term (current) use of anticoagulants: Secondary | ICD-10-CM | POA: Diagnosis not present

## 2011-06-12 DIAGNOSIS — Z8679 Personal history of other diseases of the circulatory system: Secondary | ICD-10-CM | POA: Diagnosis not present

## 2011-06-12 LAB — POCT INR: INR: 3.1

## 2011-06-20 DIAGNOSIS — E119 Type 2 diabetes mellitus without complications: Secondary | ICD-10-CM | POA: Diagnosis not present

## 2011-06-20 DIAGNOSIS — I82409 Acute embolism and thrombosis of unspecified deep veins of unspecified lower extremity: Secondary | ICD-10-CM | POA: Diagnosis not present

## 2011-06-20 DIAGNOSIS — M549 Dorsalgia, unspecified: Secondary | ICD-10-CM | POA: Diagnosis not present

## 2011-06-20 DIAGNOSIS — I10 Essential (primary) hypertension: Secondary | ICD-10-CM | POA: Diagnosis not present

## 2011-06-27 ENCOUNTER — Ambulatory Visit (INDEPENDENT_AMBULATORY_CARE_PROVIDER_SITE_OTHER): Payer: Medicare Other | Admitting: Adult Health

## 2011-06-27 ENCOUNTER — Encounter: Payer: Self-pay | Admitting: Adult Health

## 2011-06-27 ENCOUNTER — Telehealth: Payer: Self-pay | Admitting: Cardiology

## 2011-06-27 VITALS — BP 121/65 | HR 94 | Ht 67.0 in | Wt 164.0 lb

## 2011-06-27 DIAGNOSIS — I10 Essential (primary) hypertension: Secondary | ICD-10-CM | POA: Diagnosis not present

## 2011-06-27 DIAGNOSIS — E119 Type 2 diabetes mellitus without complications: Secondary | ICD-10-CM | POA: Diagnosis not present

## 2011-06-27 DIAGNOSIS — Z8679 Personal history of other diseases of the circulatory system: Secondary | ICD-10-CM | POA: Diagnosis not present

## 2011-06-27 DIAGNOSIS — I82409 Acute embolism and thrombosis of unspecified deep veins of unspecified lower extremity: Secondary | ICD-10-CM | POA: Diagnosis not present

## 2011-06-27 MED ORDER — DOCUSATE SODIUM 100 MG PO CAPS: 100.0000 mg | ORAL_CAPSULE | ORAL | Status: AC | PRN

## 2011-06-27 MED ORDER — ZOLPIDEM TARTRATE 5 MG PO TABS: 5.0000 mg | ORAL_TABLET | Freq: Every evening | ORAL | Status: DC | PRN

## 2011-06-27 NOTE — Progress Notes (Signed)
HPI: Mrs. Beattie is a 66 y/o patient of Dr. Diona Browner we are seeing on annual follow up for ongoing assessment and treatment of DVT on coumadin, followed in [facility] coumadin clinic, hypertension,  Hypercholesterolemia, with known history of GERD, diabetes, CVA. She offers no complaints at this time. She is a resident of [facility]. She uses a walker to ambulate due to some left leg weakness. She denies significant DOE, chest pain or dizziness.  Allergies  Allergen Reactions  . Sulfa Antibiotics Rash  . Sulfonamide Derivatives Rash    REACTION: rash    Current Outpatient Prescriptions  Medication Sig Dispense Refill  . acetaminophen (TYLENOL) 325 MG tablet Take 650 mg by mouth every 6 (six) hours as needed. For fever > 101       . albuterol (PROVENTIL) (2.5 MG/3ML) 0.083% nebulizer solution Take 2.5 mg by nebulization every 2 (two) hours as needed. For wheezing       . ARIPiprazole (ABILIFY) 2 MG tablet Take 1 tablet (2 mg total) by mouth daily.  30 tablet  0  . clonazePAM (KLONOPIN) 0.5 MG tablet Take 2 tablets (1 mg total) by mouth 3 (three) times daily.  90 tablet  0  . DULoxetine (CYMBALTA) 60 MG capsule Take 120 mg by mouth daily.       . enalapril (VASOTEC) 5 MG tablet Take 5 mg by mouth every morning.       Marland Kitchen HYDROcodone-acetaminophen (NORCO) 5-325 MG per tablet Take 1 tablet by mouth 4 (four) times daily.      . insulin aspart (NOVOLOG) 100 UNIT/ML injection Inject 1-6 Units into the skin 3 (three) times daily before meals. Addition to the normal 12 unit dose: If BG 150-200: add 1 unit, 201-250: add 2 units, 251-300: add 3 units, 301-350: add 4 units, 351-400: add 5 units, >400: add 6 units       . insulin glargine (LANTUS) 100 UNIT/ML injection Inject 90 Units into the skin at bedtime.       . Insulin Syringe-Needle U-100 30G X 5/16" 0.5 ML MISC by Does not apply route.      Marland Kitchen ipratropium (ATROVENT) 0.02 % nebulizer solution Take 500 mcg by nebulization 4 (four)  times daily as needed. Shortness of breath       . loperamide (IMODIUM) 2 MG capsule Take 4 mg by mouth 4 (four) times daily as needed. Take 2 capsules initially then take 2 after each loose stool per MAR       . metFORMIN (GLUCOPHAGE) 500 MG tablet Take 500 mg by mouth 2 (two) times daily with a meal.        . metoCLOPramide (REGLAN) 10 MG tablet Take 10 mg by mouth 3 (three) times daily before meals.       . metoprolol succinate (TOPROL-XL) 25 MG 24 hr tablet Take 25 mg by mouth 2 (two) times daily.       . mirtazapine (REMERON) 30 MG tablet Take 30 mg by mouth at bedtime.        Marland Kitchen omeprazole (PRILOSEC) 20 MG capsule Take 20 mg by mouth 2 (two) times daily. Take 1 po BID x 2 weeks then back down to once a day       . ondansetron (ZOFRAN-ODT) 4 MG disintegrating tablet Take 4 mg by mouth every 8 (eight) hours as needed. Nausea and Vomiting       . polyethylene glycol powder (GLYCOLAX/MIRALAX) powder Take 17 g by mouth daily.        Marland Kitchen  sitaGLIPtin (JANUVIA) 100 MG tablet Take 100 mg by mouth daily.       . solifenacin (VESICARE) 5 MG tablet Take 5 mg by mouth daily.      Marland Kitchen tiZANidine (ZANAFLEX) 2 MG tablet Take 2 mg by mouth daily.      Marland Kitchen warfarin (COUMADIN) 2.5 MG tablet Take 2.5 mg by mouth daily. Patient takes on Monday and Thursday       . warfarin (COUMADIN) 5 MG tablet Take 5 mg by mouth daily. Tuesday,Wednesday,Friday,Saturday,Sunday       . zolpidem (AMBIEN) 10 MG tablet Take 1 tablet (10 mg total) by mouth at bedtime as needed. Sleep  30 tablet  1  . DISCONTD: insulin aspart (NOVOLOG) 100 UNIT/ML injection Inject 12 Units into the skin 3 (three) times daily before meals. Hold if blood sugar is less than 100        Past Medical History  Diagnosis Date  . DVT (deep venous thrombosis)   . Diabetes mellitus   . Anxiety   . Depression   . Hemiplegia   . GERD (gastroesophageal reflux disease)   . Stroke   . Shortness of breath   . Coronary artery disease   . Arthritis   . Anxiety   .  Hypertension     Past Surgical History  Procedure Date  . Back surgery     ZOX:WRUEAV of systems complete and found to be negative unless listed above  PHYSICAL EXAM BP 121/65  Pulse 94  Ht 5\' 7"  (1.702 m)  Wt 164 lb (74.39 kg)  BMI 25.69 kg/m2  General: Well developed, well nourished, in no acute distress Head: Eyes PERRLA, No xanthomas.   Normal cephalic and atramatic  Lungs: Clear bilaterally to auscultation and percussion. Heart: HRRR S1 S2, without MRG.  Pulses are 2+ & equal.            No carotid bruit. No JVD.  No abdominal bruits. No femoral bruits. Abdomen: Bowel sounds are positive, abdomen soft and non-tender without masses or                  Hernia's noted. Msk:  Back normal, slow gait. Diminished  strength and tone on the left. . Extremities: No clubbing, cyanosis or edema.  DP +1 Neuro: Alert and oriented X 3. Psych:  Good affect, responds appropriately  EKG:NSR with evidence of inferior infarct with T-wave abnormality in the lateral lead, V6.   ASSESSMENT AND PLAN

## 2011-06-27 NOTE — Assessment & Plan Note (Signed)
Blood pressure is currently controlled. Will not make any changes in her antihypertensive regimen at this time. Recent labs are reviewed, creatinine 0.71. Potassium 4.1 (dated 05/25/2011).

## 2011-06-27 NOTE — Telephone Encounter (Signed)
Per Lupita Leash at [facility], DSS wants patient's meds to stay the same.  States that Ambien was changed from 10mg  to 5mg .  States that they need D/C order for the new dose so that she can stay on the old dose. / tg

## 2011-06-27 NOTE — Assessment & Plan Note (Signed)
Will decrease ambien to 5 mg from 10 mg per new dosing recommendations for elderly to prevent somnolence.

## 2011-06-27 NOTE — Assessment & Plan Note (Signed)
Blood glucose at [facility] is found to be consistently elevated > 200. She is advised to adhere to diabetic diet.  Note written to nursing facility.

## 2011-06-27 NOTE — Assessment & Plan Note (Addendum)
She is to continue with coumadin clinic for dosing. I have given her hemoccult cards to check stool weekly for as long as she is on anticoagulation. Hgb is lower now at 12.2 from 16.0 in March. Will follow. Stool softener is prescribed for HS use with-holding for diarrhea.

## 2011-06-27 NOTE — Patient Instructions (Signed)
**Note De-Identified  Obfuscation** Your physician has recommended you make the following change in your medication: decrease Ambien to 5 mg at bedtime and start taking Colace 100 mg OTC as needed for constipation   Your provider recommends that you complete a hemoccult card every week and call results to this office at [phone].  Your physician recommends that you schedule a follow-up appointment in: 1 year

## 2011-06-27 NOTE — Telephone Encounter (Signed)
**Note De-identified  Obfuscation** Please advise./LV 

## 2011-06-27 NOTE — Telephone Encounter (Signed)
I have called [facility] back to explain why I have decreased the dose of ambien to 5 mg from 10 mg. Diane states that DSS wants me to resend the dosage change. They will be calling DSS to discuss this with them. I will keep the dosage as it is written.

## 2011-07-03 ENCOUNTER — Encounter (INDEPENDENT_AMBULATORY_CARE_PROVIDER_SITE_OTHER): Payer: Medicare Other

## 2011-07-03 DIAGNOSIS — Z7901 Long term (current) use of anticoagulants: Secondary | ICD-10-CM

## 2011-07-04 DIAGNOSIS — B351 Tinea unguium: Secondary | ICD-10-CM | POA: Diagnosis not present

## 2011-07-04 DIAGNOSIS — M79609 Pain in unspecified limb: Secondary | ICD-10-CM | POA: Diagnosis not present

## 2011-07-05 ENCOUNTER — Other Ambulatory Visit: Payer: Self-pay

## 2011-07-05 DIAGNOSIS — Z7901 Long term (current) use of anticoagulants: Secondary | ICD-10-CM

## 2011-07-06 ENCOUNTER — Other Ambulatory Visit: Payer: Self-pay | Admitting: Cardiology

## 2011-07-06 DIAGNOSIS — R269 Unspecified abnormalities of gait and mobility: Secondary | ICD-10-CM | POA: Diagnosis not present

## 2011-07-10 ENCOUNTER — Ambulatory Visit (INDEPENDENT_AMBULATORY_CARE_PROVIDER_SITE_OTHER): Payer: Medicare Other | Admitting: *Deleted

## 2011-07-10 DIAGNOSIS — Z8679 Personal history of other diseases of the circulatory system: Secondary | ICD-10-CM

## 2011-07-10 DIAGNOSIS — Z7901 Long term (current) use of anticoagulants: Secondary | ICD-10-CM | POA: Diagnosis not present

## 2011-07-10 DIAGNOSIS — I82409 Acute embolism and thrombosis of unspecified deep veins of unspecified lower extremity: Secondary | ICD-10-CM | POA: Diagnosis not present

## 2011-07-10 LAB — POCT INR: INR: 2.9

## 2011-07-11 ENCOUNTER — Ambulatory Visit (INDEPENDENT_AMBULATORY_CARE_PROVIDER_SITE_OTHER): Payer: Medicare Other

## 2011-07-11 DIAGNOSIS — R0989 Other specified symptoms and signs involving the circulatory and respiratory systems: Secondary | ICD-10-CM

## 2011-07-13 DIAGNOSIS — F331 Major depressive disorder, recurrent, moderate: Secondary | ICD-10-CM | POA: Diagnosis not present

## 2011-07-17 ENCOUNTER — Encounter (INDEPENDENT_AMBULATORY_CARE_PROVIDER_SITE_OTHER): Payer: Medicare Other

## 2011-07-17 ENCOUNTER — Other Ambulatory Visit: Payer: Self-pay

## 2011-07-17 DIAGNOSIS — Z7901 Long term (current) use of anticoagulants: Secondary | ICD-10-CM

## 2011-07-17 DIAGNOSIS — E119 Type 2 diabetes mellitus without complications: Secondary | ICD-10-CM | POA: Diagnosis not present

## 2011-07-17 DIAGNOSIS — I10 Essential (primary) hypertension: Secondary | ICD-10-CM | POA: Diagnosis not present

## 2011-07-17 DIAGNOSIS — I6789 Other cerebrovascular disease: Secondary | ICD-10-CM | POA: Diagnosis not present

## 2011-07-25 DIAGNOSIS — IMO0001 Reserved for inherently not codable concepts without codable children: Secondary | ICD-10-CM | POA: Diagnosis not present

## 2011-07-31 ENCOUNTER — Other Ambulatory Visit: Payer: Self-pay | Admitting: Adult Health

## 2011-07-31 ENCOUNTER — Ambulatory Visit (INDEPENDENT_AMBULATORY_CARE_PROVIDER_SITE_OTHER): Payer: Medicare Other

## 2011-07-31 DIAGNOSIS — R0989 Other specified symptoms and signs involving the circulatory and respiratory systems: Secondary | ICD-10-CM

## 2011-08-02 ENCOUNTER — Encounter (INDEPENDENT_AMBULATORY_CARE_PROVIDER_SITE_OTHER): Payer: Medicare Other

## 2011-08-02 DIAGNOSIS — I10 Essential (primary) hypertension: Secondary | ICD-10-CM | POA: Diagnosis not present

## 2011-08-02 DIAGNOSIS — R0989 Other specified symptoms and signs involving the circulatory and respiratory systems: Secondary | ICD-10-CM

## 2011-08-02 DIAGNOSIS — I6350 Cerebral infarction due to unspecified occlusion or stenosis of unspecified cerebral artery: Secondary | ICD-10-CM | POA: Diagnosis not present

## 2011-08-02 DIAGNOSIS — E785 Hyperlipidemia, unspecified: Secondary | ICD-10-CM | POA: Diagnosis not present

## 2011-08-02 DIAGNOSIS — IMO0001 Reserved for inherently not codable concepts without codable children: Secondary | ICD-10-CM | POA: Diagnosis not present

## 2011-08-07 ENCOUNTER — Ambulatory Visit (INDEPENDENT_AMBULATORY_CARE_PROVIDER_SITE_OTHER): Payer: Medicare Other | Admitting: *Deleted

## 2011-08-07 ENCOUNTER — Other Ambulatory Visit: Payer: Self-pay

## 2011-08-07 DIAGNOSIS — Z8679 Personal history of other diseases of the circulatory system: Secondary | ICD-10-CM

## 2011-08-07 DIAGNOSIS — Z7901 Long term (current) use of anticoagulants: Secondary | ICD-10-CM

## 2011-08-07 DIAGNOSIS — I82409 Acute embolism and thrombosis of unspecified deep veins of unspecified lower extremity: Secondary | ICD-10-CM | POA: Diagnosis not present

## 2011-08-07 DIAGNOSIS — I6789 Other cerebrovascular disease: Secondary | ICD-10-CM | POA: Diagnosis not present

## 2011-08-07 LAB — POCT INR: INR: 2.4

## 2011-08-10 DIAGNOSIS — I6789 Other cerebrovascular disease: Secondary | ICD-10-CM | POA: Diagnosis not present

## 2011-08-10 DIAGNOSIS — I10 Essential (primary) hypertension: Secondary | ICD-10-CM | POA: Diagnosis not present

## 2011-08-10 DIAGNOSIS — E119 Type 2 diabetes mellitus without complications: Secondary | ICD-10-CM | POA: Diagnosis not present

## 2011-08-10 DIAGNOSIS — I82409 Acute embolism and thrombosis of unspecified deep veins of unspecified lower extremity: Secondary | ICD-10-CM | POA: Diagnosis not present

## 2011-08-17 DIAGNOSIS — F331 Major depressive disorder, recurrent, moderate: Secondary | ICD-10-CM | POA: Diagnosis not present

## 2011-08-17 DIAGNOSIS — F4310 Post-traumatic stress disorder, unspecified: Secondary | ICD-10-CM | POA: Diagnosis not present

## 2011-08-25 DIAGNOSIS — E119 Type 2 diabetes mellitus without complications: Secondary | ICD-10-CM | POA: Diagnosis not present

## 2011-08-25 DIAGNOSIS — L84 Corns and callosities: Secondary | ICD-10-CM | POA: Diagnosis not present

## 2011-08-25 DIAGNOSIS — J329 Chronic sinusitis, unspecified: Secondary | ICD-10-CM | POA: Diagnosis not present

## 2011-08-28 ENCOUNTER — Other Ambulatory Visit (INDEPENDENT_AMBULATORY_CARE_PROVIDER_SITE_OTHER): Payer: Medicare Other | Admitting: *Deleted

## 2011-08-28 ENCOUNTER — Encounter (INDEPENDENT_AMBULATORY_CARE_PROVIDER_SITE_OTHER): Payer: Medicare Other

## 2011-08-28 DIAGNOSIS — Z7901 Long term (current) use of anticoagulants: Secondary | ICD-10-CM | POA: Diagnosis not present

## 2011-08-28 DIAGNOSIS — R0989 Other specified symptoms and signs involving the circulatory and respiratory systems: Secondary | ICD-10-CM

## 2011-08-28 LAB — POC HEMOCCULT BLD/STL (HOME/3-CARD/SCREEN)
Card #2 Fecal Occult Blod, POC: NEGATIVE
Card #3 Fecal Occult Blood, POC: NEGATIVE
Fecal Occult Blood, POC: NEGATIVE

## 2011-08-29 DIAGNOSIS — H5200 Hypermetropia, unspecified eye: Secondary | ICD-10-CM | POA: Diagnosis not present

## 2011-08-29 DIAGNOSIS — H04129 Dry eye syndrome of unspecified lacrimal gland: Secondary | ICD-10-CM | POA: Diagnosis not present

## 2011-08-29 DIAGNOSIS — E1139 Type 2 diabetes mellitus with other diabetic ophthalmic complication: Secondary | ICD-10-CM | POA: Diagnosis not present

## 2011-08-29 DIAGNOSIS — D3130 Benign neoplasm of unspecified choroid: Secondary | ICD-10-CM | POA: Diagnosis not present

## 2011-09-04 ENCOUNTER — Other Ambulatory Visit: Payer: Self-pay

## 2011-09-04 ENCOUNTER — Encounter: Payer: Medicare Other | Admitting: *Deleted

## 2011-09-04 DIAGNOSIS — Z7901 Long term (current) use of anticoagulants: Secondary | ICD-10-CM

## 2011-09-05 DIAGNOSIS — M79609 Pain in unspecified limb: Secondary | ICD-10-CM | POA: Diagnosis not present

## 2011-09-05 DIAGNOSIS — B351 Tinea unguium: Secondary | ICD-10-CM | POA: Diagnosis not present

## 2011-09-07 DIAGNOSIS — M6281 Muscle weakness (generalized): Secondary | ICD-10-CM | POA: Diagnosis not present

## 2011-09-11 ENCOUNTER — Other Ambulatory Visit: Payer: Self-pay | Admitting: Cardiology

## 2011-09-18 ENCOUNTER — Ambulatory Visit (INDEPENDENT_AMBULATORY_CARE_PROVIDER_SITE_OTHER): Payer: Medicare Other | Admitting: *Deleted

## 2011-09-18 DIAGNOSIS — Z8679 Personal history of other diseases of the circulatory system: Secondary | ICD-10-CM | POA: Diagnosis not present

## 2011-09-18 DIAGNOSIS — I82409 Acute embolism and thrombosis of unspecified deep veins of unspecified lower extremity: Secondary | ICD-10-CM | POA: Diagnosis not present

## 2011-09-18 DIAGNOSIS — Z7901 Long term (current) use of anticoagulants: Secondary | ICD-10-CM | POA: Diagnosis not present

## 2011-09-18 LAB — POCT INR: INR: 1.9

## 2011-09-22 ENCOUNTER — Other Ambulatory Visit (INDEPENDENT_AMBULATORY_CARE_PROVIDER_SITE_OTHER): Payer: Medicare Other | Admitting: *Deleted

## 2011-09-22 ENCOUNTER — Encounter (INDEPENDENT_AMBULATORY_CARE_PROVIDER_SITE_OTHER): Payer: Medicare Other

## 2011-09-22 DIAGNOSIS — R0989 Other specified symptoms and signs involving the circulatory and respiratory systems: Secondary | ICD-10-CM

## 2011-09-22 DIAGNOSIS — Z7901 Long term (current) use of anticoagulants: Secondary | ICD-10-CM

## 2011-09-22 LAB — POC HEMOCCULT BLD/STL (OFFICE/1-CARD/DIAGNOSTIC)
Card #2 Fecal Occult Blod, POC: NEGATIVE
Fecal Occult Blood, POC: NEGATIVE

## 2011-09-24 ENCOUNTER — Encounter: Payer: Self-pay | Admitting: Cardiology

## 2011-09-26 DIAGNOSIS — E119 Type 2 diabetes mellitus without complications: Secondary | ICD-10-CM | POA: Diagnosis not present

## 2011-09-26 DIAGNOSIS — I10 Essential (primary) hypertension: Secondary | ICD-10-CM | POA: Diagnosis not present

## 2011-09-26 DIAGNOSIS — E559 Vitamin D deficiency, unspecified: Secondary | ICD-10-CM | POA: Diagnosis not present

## 2011-10-03 DIAGNOSIS — I10 Essential (primary) hypertension: Secondary | ICD-10-CM | POA: Diagnosis not present

## 2011-10-03 DIAGNOSIS — IMO0001 Reserved for inherently not codable concepts without codable children: Secondary | ICD-10-CM | POA: Diagnosis not present

## 2011-10-03 DIAGNOSIS — E785 Hyperlipidemia, unspecified: Secondary | ICD-10-CM | POA: Diagnosis not present

## 2011-10-06 ENCOUNTER — Other Ambulatory Visit (INDEPENDENT_AMBULATORY_CARE_PROVIDER_SITE_OTHER): Payer: Medicare Other | Admitting: *Deleted

## 2011-10-06 ENCOUNTER — Encounter (INDEPENDENT_AMBULATORY_CARE_PROVIDER_SITE_OTHER): Payer: Medicare Other | Admitting: *Deleted

## 2011-10-06 DIAGNOSIS — Z7901 Long term (current) use of anticoagulants: Secondary | ICD-10-CM | POA: Diagnosis not present

## 2011-10-06 DIAGNOSIS — R0989 Other specified symptoms and signs involving the circulatory and respiratory systems: Secondary | ICD-10-CM

## 2011-10-06 LAB — POC HEMOCCULT BLD/STL (HOME/3-CARD/SCREEN)
Card #2 Fecal Occult Blod, POC: NEGATIVE
Card #3 Fecal Occult Blood, POC: NEGATIVE
Fecal Occult Blood, POC: NEGATIVE

## 2011-10-09 DIAGNOSIS — E119 Type 2 diabetes mellitus without complications: Secondary | ICD-10-CM | POA: Diagnosis not present

## 2011-10-10 ENCOUNTER — Other Ambulatory Visit: Payer: Self-pay | Admitting: Cardiology

## 2011-10-12 DIAGNOSIS — E119 Type 2 diabetes mellitus without complications: Secondary | ICD-10-CM | POA: Diagnosis not present

## 2011-10-12 DIAGNOSIS — J309 Allergic rhinitis, unspecified: Secondary | ICD-10-CM | POA: Diagnosis not present

## 2011-10-13 DIAGNOSIS — Z23 Encounter for immunization: Secondary | ICD-10-CM | POA: Diagnosis not present

## 2011-10-16 ENCOUNTER — Ambulatory Visit (INDEPENDENT_AMBULATORY_CARE_PROVIDER_SITE_OTHER): Payer: Medicare Other | Admitting: *Deleted

## 2011-10-16 DIAGNOSIS — Z8679 Personal history of other diseases of the circulatory system: Secondary | ICD-10-CM

## 2011-10-16 DIAGNOSIS — I82409 Acute embolism and thrombosis of unspecified deep veins of unspecified lower extremity: Secondary | ICD-10-CM

## 2011-10-16 DIAGNOSIS — Z7901 Long term (current) use of anticoagulants: Secondary | ICD-10-CM

## 2011-10-16 LAB — POCT INR: INR: 3.1

## 2011-10-19 DIAGNOSIS — F4310 Post-traumatic stress disorder, unspecified: Secondary | ICD-10-CM | POA: Diagnosis not present

## 2011-10-20 ENCOUNTER — Encounter (INDEPENDENT_AMBULATORY_CARE_PROVIDER_SITE_OTHER): Payer: Medicare Other

## 2011-10-20 ENCOUNTER — Other Ambulatory Visit (INDEPENDENT_AMBULATORY_CARE_PROVIDER_SITE_OTHER): Payer: Medicare Other | Admitting: *Deleted

## 2011-10-20 DIAGNOSIS — Z7901 Long term (current) use of anticoagulants: Secondary | ICD-10-CM | POA: Diagnosis not present

## 2011-10-20 DIAGNOSIS — R0989 Other specified symptoms and signs involving the circulatory and respiratory systems: Secondary | ICD-10-CM

## 2011-10-20 LAB — POC HEMOCCULT BLD/STL (HOME/3-CARD/SCREEN)
Card #2 Fecal Occult Blod, POC: NEGATIVE
Card #3 Fecal Occult Blood, POC: NEGATIVE
Fecal Occult Blood, POC: NEGATIVE

## 2011-11-13 ENCOUNTER — Ambulatory Visit (INDEPENDENT_AMBULATORY_CARE_PROVIDER_SITE_OTHER): Payer: Medicare Other | Admitting: *Deleted

## 2011-11-13 DIAGNOSIS — I82409 Acute embolism and thrombosis of unspecified deep veins of unspecified lower extremity: Secondary | ICD-10-CM | POA: Diagnosis not present

## 2011-11-13 DIAGNOSIS — Z8679 Personal history of other diseases of the circulatory system: Secondary | ICD-10-CM

## 2011-11-13 DIAGNOSIS — Z7901 Long term (current) use of anticoagulants: Secondary | ICD-10-CM

## 2011-11-13 LAB — POCT INR: INR: 3.5

## 2011-12-04 ENCOUNTER — Ambulatory Visit (INDEPENDENT_AMBULATORY_CARE_PROVIDER_SITE_OTHER): Payer: Medicare Other | Admitting: *Deleted

## 2011-12-04 DIAGNOSIS — Z7901 Long term (current) use of anticoagulants: Secondary | ICD-10-CM | POA: Diagnosis not present

## 2011-12-04 DIAGNOSIS — Z8679 Personal history of other diseases of the circulatory system: Secondary | ICD-10-CM | POA: Diagnosis not present

## 2011-12-04 DIAGNOSIS — I82409 Acute embolism and thrombosis of unspecified deep veins of unspecified lower extremity: Secondary | ICD-10-CM

## 2011-12-04 LAB — POCT INR: INR: 2.8

## 2011-12-07 DIAGNOSIS — F331 Major depressive disorder, recurrent, moderate: Secondary | ICD-10-CM | POA: Diagnosis not present

## 2011-12-07 DIAGNOSIS — R269 Unspecified abnormalities of gait and mobility: Secondary | ICD-10-CM | POA: Diagnosis not present

## 2011-12-26 DIAGNOSIS — I10 Essential (primary) hypertension: Secondary | ICD-10-CM | POA: Diagnosis not present

## 2011-12-26 DIAGNOSIS — E559 Vitamin D deficiency, unspecified: Secondary | ICD-10-CM | POA: Diagnosis not present

## 2011-12-26 DIAGNOSIS — E785 Hyperlipidemia, unspecified: Secondary | ICD-10-CM | POA: Diagnosis not present

## 2011-12-26 DIAGNOSIS — IMO0001 Reserved for inherently not codable concepts without codable children: Secondary | ICD-10-CM | POA: Diagnosis not present

## 2011-12-28 DIAGNOSIS — E119 Type 2 diabetes mellitus without complications: Secondary | ICD-10-CM | POA: Diagnosis not present

## 2012-01-01 ENCOUNTER — Ambulatory Visit (INDEPENDENT_AMBULATORY_CARE_PROVIDER_SITE_OTHER): Payer: Medicare Other | Admitting: *Deleted

## 2012-01-01 DIAGNOSIS — Z8679 Personal history of other diseases of the circulatory system: Secondary | ICD-10-CM

## 2012-01-01 DIAGNOSIS — I82409 Acute embolism and thrombosis of unspecified deep veins of unspecified lower extremity: Secondary | ICD-10-CM | POA: Diagnosis not present

## 2012-01-01 DIAGNOSIS — Z7901 Long term (current) use of anticoagulants: Secondary | ICD-10-CM | POA: Diagnosis not present

## 2012-01-01 LAB — POCT INR: INR: 2.5

## 2012-01-02 DIAGNOSIS — E785 Hyperlipidemia, unspecified: Secondary | ICD-10-CM | POA: Diagnosis not present

## 2012-01-02 DIAGNOSIS — I10 Essential (primary) hypertension: Secondary | ICD-10-CM | POA: Diagnosis not present

## 2012-01-02 DIAGNOSIS — IMO0001 Reserved for inherently not codable concepts without codable children: Secondary | ICD-10-CM | POA: Diagnosis not present

## 2012-01-02 DIAGNOSIS — E559 Vitamin D deficiency, unspecified: Secondary | ICD-10-CM | POA: Diagnosis not present

## 2012-01-05 DIAGNOSIS — I6789 Other cerebrovascular disease: Secondary | ICD-10-CM | POA: Diagnosis not present

## 2012-01-08 DIAGNOSIS — I69998 Other sequelae following unspecified cerebrovascular disease: Secondary | ICD-10-CM | POA: Diagnosis not present

## 2012-01-11 DIAGNOSIS — E119 Type 2 diabetes mellitus without complications: Secondary | ICD-10-CM | POA: Diagnosis not present

## 2012-01-11 DIAGNOSIS — G8929 Other chronic pain: Secondary | ICD-10-CM | POA: Diagnosis not present

## 2012-01-11 DIAGNOSIS — I6789 Other cerebrovascular disease: Secondary | ICD-10-CM | POA: Diagnosis not present

## 2012-01-11 DIAGNOSIS — I82409 Acute embolism and thrombosis of unspecified deep veins of unspecified lower extremity: Secondary | ICD-10-CM | POA: Diagnosis not present

## 2012-01-25 DIAGNOSIS — I69998 Other sequelae following unspecified cerebrovascular disease: Secondary | ICD-10-CM | POA: Diagnosis not present

## 2012-01-29 ENCOUNTER — Ambulatory Visit (INDEPENDENT_AMBULATORY_CARE_PROVIDER_SITE_OTHER): Payer: Medicare Other | Admitting: *Deleted

## 2012-01-29 DIAGNOSIS — Z7901 Long term (current) use of anticoagulants: Secondary | ICD-10-CM

## 2012-01-29 DIAGNOSIS — I82409 Acute embolism and thrombosis of unspecified deep veins of unspecified lower extremity: Secondary | ICD-10-CM | POA: Diagnosis not present

## 2012-01-29 DIAGNOSIS — Z8679 Personal history of other diseases of the circulatory system: Secondary | ICD-10-CM

## 2012-01-29 LAB — POCT INR: INR: 2.6

## 2012-02-01 DIAGNOSIS — B351 Tinea unguium: Secondary | ICD-10-CM | POA: Diagnosis not present

## 2012-02-01 DIAGNOSIS — M79609 Pain in unspecified limb: Secondary | ICD-10-CM | POA: Diagnosis not present

## 2012-02-21 ENCOUNTER — Other Ambulatory Visit: Payer: Self-pay | Admitting: Cardiology

## 2012-03-04 ENCOUNTER — Ambulatory Visit (INDEPENDENT_AMBULATORY_CARE_PROVIDER_SITE_OTHER): Payer: Medicare Other | Admitting: *Deleted

## 2012-03-04 DIAGNOSIS — I82409 Acute embolism and thrombosis of unspecified deep veins of unspecified lower extremity: Secondary | ICD-10-CM

## 2012-03-04 DIAGNOSIS — Z7901 Long term (current) use of anticoagulants: Secondary | ICD-10-CM | POA: Diagnosis not present

## 2012-03-04 DIAGNOSIS — Z8679 Personal history of other diseases of the circulatory system: Secondary | ICD-10-CM

## 2012-03-04 LAB — POCT INR: INR: 2.5

## 2012-03-14 DIAGNOSIS — F331 Major depressive disorder, recurrent, moderate: Secondary | ICD-10-CM | POA: Diagnosis not present

## 2012-03-15 DIAGNOSIS — I69998 Other sequelae following unspecified cerebrovascular disease: Secondary | ICD-10-CM | POA: Diagnosis not present

## 2012-03-28 DIAGNOSIS — E785 Hyperlipidemia, unspecified: Secondary | ICD-10-CM | POA: Diagnosis not present

## 2012-03-28 DIAGNOSIS — I10 Essential (primary) hypertension: Secondary | ICD-10-CM | POA: Diagnosis not present

## 2012-03-28 DIAGNOSIS — E559 Vitamin D deficiency, unspecified: Secondary | ICD-10-CM | POA: Diagnosis not present

## 2012-03-28 DIAGNOSIS — IMO0001 Reserved for inherently not codable concepts without codable children: Secondary | ICD-10-CM | POA: Diagnosis not present

## 2012-04-01 DIAGNOSIS — I6789 Other cerebrovascular disease: Secondary | ICD-10-CM | POA: Diagnosis not present

## 2012-04-02 DIAGNOSIS — IMO0001 Reserved for inherently not codable concepts without codable children: Secondary | ICD-10-CM | POA: Diagnosis not present

## 2012-04-02 DIAGNOSIS — E785 Hyperlipidemia, unspecified: Secondary | ICD-10-CM | POA: Diagnosis not present

## 2012-04-11 DIAGNOSIS — I10 Essential (primary) hypertension: Secondary | ICD-10-CM | POA: Diagnosis not present

## 2012-04-11 DIAGNOSIS — I82409 Acute embolism and thrombosis of unspecified deep veins of unspecified lower extremity: Secondary | ICD-10-CM | POA: Diagnosis not present

## 2012-04-11 DIAGNOSIS — E119 Type 2 diabetes mellitus without complications: Secondary | ICD-10-CM | POA: Diagnosis not present

## 2012-04-15 ENCOUNTER — Ambulatory Visit (INDEPENDENT_AMBULATORY_CARE_PROVIDER_SITE_OTHER): Payer: Medicare Other | Admitting: *Deleted

## 2012-04-15 DIAGNOSIS — I82409 Acute embolism and thrombosis of unspecified deep veins of unspecified lower extremity: Secondary | ICD-10-CM

## 2012-04-15 DIAGNOSIS — M79609 Pain in unspecified limb: Secondary | ICD-10-CM | POA: Diagnosis not present

## 2012-04-15 DIAGNOSIS — Z7901 Long term (current) use of anticoagulants: Secondary | ICD-10-CM | POA: Diagnosis not present

## 2012-04-15 DIAGNOSIS — Z8679 Personal history of other diseases of the circulatory system: Secondary | ICD-10-CM | POA: Diagnosis not present

## 2012-04-15 DIAGNOSIS — B351 Tinea unguium: Secondary | ICD-10-CM | POA: Diagnosis not present

## 2012-04-15 LAB — POCT INR: INR: 3.3

## 2012-04-16 DIAGNOSIS — H04129 Dry eye syndrome of unspecified lacrimal gland: Secondary | ICD-10-CM | POA: Diagnosis not present

## 2012-04-16 DIAGNOSIS — E1139 Type 2 diabetes mellitus with other diabetic ophthalmic complication: Secondary | ICD-10-CM | POA: Diagnosis not present

## 2012-04-17 DIAGNOSIS — E785 Hyperlipidemia, unspecified: Secondary | ICD-10-CM | POA: Diagnosis not present

## 2012-04-17 DIAGNOSIS — IMO0001 Reserved for inherently not codable concepts without codable children: Secondary | ICD-10-CM | POA: Diagnosis not present

## 2012-04-17 DIAGNOSIS — I10 Essential (primary) hypertension: Secondary | ICD-10-CM | POA: Diagnosis not present

## 2012-05-13 ENCOUNTER — Ambulatory Visit (INDEPENDENT_AMBULATORY_CARE_PROVIDER_SITE_OTHER): Payer: Medicare Other | Admitting: *Deleted

## 2012-05-13 DIAGNOSIS — Z8679 Personal history of other diseases of the circulatory system: Secondary | ICD-10-CM | POA: Diagnosis not present

## 2012-05-13 DIAGNOSIS — Z7901 Long term (current) use of anticoagulants: Secondary | ICD-10-CM

## 2012-05-13 DIAGNOSIS — I82409 Acute embolism and thrombosis of unspecified deep veins of unspecified lower extremity: Secondary | ICD-10-CM

## 2012-05-13 LAB — POCT INR: INR: 3

## 2012-05-22 DIAGNOSIS — I6789 Other cerebrovascular disease: Secondary | ICD-10-CM | POA: Diagnosis not present

## 2012-06-06 DIAGNOSIS — F331 Major depressive disorder, recurrent, moderate: Secondary | ICD-10-CM | POA: Diagnosis not present

## 2012-06-07 ENCOUNTER — Other Ambulatory Visit: Payer: Self-pay | Admitting: Cardiology

## 2012-06-10 ENCOUNTER — Telehealth: Payer: Self-pay | Admitting: *Deleted

## 2012-06-10 ENCOUNTER — Ambulatory Visit (INDEPENDENT_AMBULATORY_CARE_PROVIDER_SITE_OTHER): Payer: Medicare Other | Admitting: *Deleted

## 2012-06-10 DIAGNOSIS — I82409 Acute embolism and thrombosis of unspecified deep veins of unspecified lower extremity: Secondary | ICD-10-CM

## 2012-06-10 DIAGNOSIS — Z8679 Personal history of other diseases of the circulatory system: Secondary | ICD-10-CM

## 2012-06-10 DIAGNOSIS — Z7901 Long term (current) use of anticoagulants: Secondary | ICD-10-CM

## 2012-06-10 LAB — POCT INR: INR: 2.9

## 2012-06-10 NOTE — Telephone Encounter (Signed)
[phone], THEY NEED A ORDER SIGNED BY A DOCTOR THAT STATES STANDING ORDER FOR INR/PT PRN

## 2012-06-11 ENCOUNTER — Telehealth: Payer: Self-pay | Admitting: *Deleted

## 2012-06-11 DIAGNOSIS — R269 Unspecified abnormalities of gait and mobility: Secondary | ICD-10-CM | POA: Diagnosis not present

## 2012-06-11 NOTE — Telephone Encounter (Signed)
Herbert Seta is calling from [facility].  Lupita Leash states they need a standing PT-INR Coumadin order on this patient.

## 2012-06-12 NOTE — Telephone Encounter (Signed)
Order faxed to [facility]

## 2012-06-12 NOTE — Telephone Encounter (Signed)
Order faxed to [facility] 

## 2012-06-14 DIAGNOSIS — E119 Type 2 diabetes mellitus without complications: Secondary | ICD-10-CM | POA: Diagnosis not present

## 2012-06-14 DIAGNOSIS — I10 Essential (primary) hypertension: Secondary | ICD-10-CM | POA: Diagnosis not present

## 2012-06-14 DIAGNOSIS — E785 Hyperlipidemia, unspecified: Secondary | ICD-10-CM | POA: Diagnosis not present

## 2012-06-20 DIAGNOSIS — I69998 Other sequelae following unspecified cerebrovascular disease: Secondary | ICD-10-CM | POA: Diagnosis not present

## 2012-07-01 DIAGNOSIS — I10 Essential (primary) hypertension: Secondary | ICD-10-CM | POA: Diagnosis not present

## 2012-07-01 DIAGNOSIS — E669 Obesity, unspecified: Secondary | ICD-10-CM | POA: Diagnosis not present

## 2012-07-01 DIAGNOSIS — IMO0001 Reserved for inherently not codable concepts without codable children: Secondary | ICD-10-CM | POA: Diagnosis not present

## 2012-07-01 DIAGNOSIS — E785 Hyperlipidemia, unspecified: Secondary | ICD-10-CM | POA: Diagnosis not present

## 2012-07-03 DIAGNOSIS — M79609 Pain in unspecified limb: Secondary | ICD-10-CM | POA: Diagnosis not present

## 2012-07-03 DIAGNOSIS — B351 Tinea unguium: Secondary | ICD-10-CM | POA: Diagnosis not present

## 2012-07-11 DIAGNOSIS — I10 Essential (primary) hypertension: Secondary | ICD-10-CM | POA: Diagnosis not present

## 2012-07-11 DIAGNOSIS — M1990 Unspecified osteoarthritis, unspecified site: Secondary | ICD-10-CM | POA: Diagnosis not present

## 2012-07-11 DIAGNOSIS — I6789 Other cerebrovascular disease: Secondary | ICD-10-CM | POA: Diagnosis not present

## 2012-07-11 DIAGNOSIS — E119 Type 2 diabetes mellitus without complications: Secondary | ICD-10-CM | POA: Diagnosis not present

## 2012-07-22 ENCOUNTER — Ambulatory Visit (INDEPENDENT_AMBULATORY_CARE_PROVIDER_SITE_OTHER): Payer: Medicare Other | Admitting: *Deleted

## 2012-07-22 DIAGNOSIS — Z7901 Long term (current) use of anticoagulants: Secondary | ICD-10-CM | POA: Diagnosis not present

## 2012-07-22 DIAGNOSIS — I82409 Acute embolism and thrombosis of unspecified deep veins of unspecified lower extremity: Secondary | ICD-10-CM

## 2012-07-22 DIAGNOSIS — Z8679 Personal history of other diseases of the circulatory system: Secondary | ICD-10-CM

## 2012-07-22 LAB — POCT INR: INR: 2.8

## 2012-07-30 DIAGNOSIS — I10 Essential (primary) hypertension: Secondary | ICD-10-CM | POA: Diagnosis not present

## 2012-07-30 DIAGNOSIS — M25569 Pain in unspecified knee: Secondary | ICD-10-CM | POA: Diagnosis not present

## 2012-07-30 DIAGNOSIS — M25519 Pain in unspecified shoulder: Secondary | ICD-10-CM | POA: Diagnosis not present

## 2012-07-30 DIAGNOSIS — F17200 Nicotine dependence, unspecified, uncomplicated: Secondary | ICD-10-CM | POA: Diagnosis not present

## 2012-08-15 DIAGNOSIS — I10 Essential (primary) hypertension: Secondary | ICD-10-CM | POA: Diagnosis not present

## 2012-08-15 DIAGNOSIS — F17200 Nicotine dependence, unspecified, uncomplicated: Secondary | ICD-10-CM | POA: Diagnosis not present

## 2012-08-15 DIAGNOSIS — M25569 Pain in unspecified knee: Secondary | ICD-10-CM | POA: Diagnosis not present

## 2012-08-15 DIAGNOSIS — M25519 Pain in unspecified shoulder: Secondary | ICD-10-CM | POA: Diagnosis not present

## 2012-09-02 ENCOUNTER — Ambulatory Visit (INDEPENDENT_AMBULATORY_CARE_PROVIDER_SITE_OTHER): Payer: Medicare Other | Admitting: *Deleted

## 2012-09-02 DIAGNOSIS — Z8679 Personal history of other diseases of the circulatory system: Secondary | ICD-10-CM

## 2012-09-02 DIAGNOSIS — Z7901 Long term (current) use of anticoagulants: Secondary | ICD-10-CM | POA: Diagnosis not present

## 2012-09-02 DIAGNOSIS — I82409 Acute embolism and thrombosis of unspecified deep veins of unspecified lower extremity: Secondary | ICD-10-CM

## 2012-09-02 LAB — POCT INR: INR: 2.8

## 2012-09-05 DIAGNOSIS — F331 Major depressive disorder, recurrent, moderate: Secondary | ICD-10-CM | POA: Diagnosis not present

## 2012-09-06 DIAGNOSIS — E785 Hyperlipidemia, unspecified: Secondary | ICD-10-CM | POA: Diagnosis not present

## 2012-09-06 DIAGNOSIS — IMO0001 Reserved for inherently not codable concepts without codable children: Secondary | ICD-10-CM | POA: Diagnosis not present

## 2012-09-06 DIAGNOSIS — I10 Essential (primary) hypertension: Secondary | ICD-10-CM | POA: Diagnosis not present

## 2012-09-12 DIAGNOSIS — M25519 Pain in unspecified shoulder: Secondary | ICD-10-CM | POA: Diagnosis not present

## 2012-09-12 DIAGNOSIS — I10 Essential (primary) hypertension: Secondary | ICD-10-CM | POA: Diagnosis not present

## 2012-09-12 DIAGNOSIS — M25569 Pain in unspecified knee: Secondary | ICD-10-CM | POA: Diagnosis not present

## 2012-09-12 DIAGNOSIS — F17200 Nicotine dependence, unspecified, uncomplicated: Secondary | ICD-10-CM | POA: Diagnosis not present

## 2012-09-18 DIAGNOSIS — M79609 Pain in unspecified limb: Secondary | ICD-10-CM | POA: Diagnosis not present

## 2012-09-18 DIAGNOSIS — B351 Tinea unguium: Secondary | ICD-10-CM | POA: Diagnosis not present

## 2012-09-27 DIAGNOSIS — IMO0001 Reserved for inherently not codable concepts without codable children: Secondary | ICD-10-CM | POA: Diagnosis not present

## 2012-09-27 DIAGNOSIS — E0591 Thyrotoxicosis, unspecified with thyrotoxic crisis or storm: Secondary | ICD-10-CM | POA: Diagnosis not present

## 2012-09-27 DIAGNOSIS — E785 Hyperlipidemia, unspecified: Secondary | ICD-10-CM | POA: Diagnosis not present

## 2012-09-27 DIAGNOSIS — I10 Essential (primary) hypertension: Secondary | ICD-10-CM | POA: Diagnosis not present

## 2012-09-27 DIAGNOSIS — E669 Obesity, unspecified: Secondary | ICD-10-CM | POA: Diagnosis not present

## 2012-10-10 DIAGNOSIS — I10 Essential (primary) hypertension: Secondary | ICD-10-CM | POA: Diagnosis not present

## 2012-10-10 DIAGNOSIS — E119 Type 2 diabetes mellitus without complications: Secondary | ICD-10-CM | POA: Diagnosis not present

## 2012-10-10 DIAGNOSIS — M1990 Unspecified osteoarthritis, unspecified site: Secondary | ICD-10-CM | POA: Diagnosis not present

## 2012-10-10 DIAGNOSIS — Z23 Encounter for immunization: Secondary | ICD-10-CM | POA: Diagnosis not present

## 2012-10-10 DIAGNOSIS — I82409 Acute embolism and thrombosis of unspecified deep veins of unspecified lower extremity: Secondary | ICD-10-CM | POA: Diagnosis not present

## 2012-10-14 ENCOUNTER — Ambulatory Visit (INDEPENDENT_AMBULATORY_CARE_PROVIDER_SITE_OTHER): Payer: Medicare Other | Admitting: *Deleted

## 2012-10-14 DIAGNOSIS — Z8679 Personal history of other diseases of the circulatory system: Secondary | ICD-10-CM

## 2012-10-14 DIAGNOSIS — Z7901 Long term (current) use of anticoagulants: Secondary | ICD-10-CM | POA: Diagnosis not present

## 2012-10-14 DIAGNOSIS — I82409 Acute embolism and thrombosis of unspecified deep veins of unspecified lower extremity: Secondary | ICD-10-CM

## 2012-10-14 LAB — POCT INR: INR: 2.7

## 2012-10-15 DIAGNOSIS — M25519 Pain in unspecified shoulder: Secondary | ICD-10-CM | POA: Diagnosis not present

## 2012-10-15 DIAGNOSIS — F17200 Nicotine dependence, unspecified, uncomplicated: Secondary | ICD-10-CM | POA: Diagnosis not present

## 2012-10-15 DIAGNOSIS — M25569 Pain in unspecified knee: Secondary | ICD-10-CM | POA: Diagnosis not present

## 2012-10-15 DIAGNOSIS — I10 Essential (primary) hypertension: Secondary | ICD-10-CM | POA: Diagnosis not present

## 2012-10-16 DIAGNOSIS — H5200 Hypermetropia, unspecified eye: Secondary | ICD-10-CM | POA: Diagnosis not present

## 2012-10-16 DIAGNOSIS — H35379 Puckering of macula, unspecified eye: Secondary | ICD-10-CM | POA: Diagnosis not present

## 2012-10-16 DIAGNOSIS — D3130 Benign neoplasm of unspecified choroid: Secondary | ICD-10-CM | POA: Diagnosis not present

## 2012-10-16 DIAGNOSIS — E1139 Type 2 diabetes mellitus with other diabetic ophthalmic complication: Secondary | ICD-10-CM | POA: Diagnosis not present

## 2012-11-08 DIAGNOSIS — E119 Type 2 diabetes mellitus without complications: Secondary | ICD-10-CM | POA: Diagnosis not present

## 2012-11-14 DIAGNOSIS — M25569 Pain in unspecified knee: Secondary | ICD-10-CM | POA: Diagnosis not present

## 2012-11-14 DIAGNOSIS — M25519 Pain in unspecified shoulder: Secondary | ICD-10-CM | POA: Diagnosis not present

## 2012-11-14 DIAGNOSIS — I10 Essential (primary) hypertension: Secondary | ICD-10-CM | POA: Diagnosis not present

## 2012-11-14 DIAGNOSIS — F17200 Nicotine dependence, unspecified, uncomplicated: Secondary | ICD-10-CM | POA: Diagnosis not present

## 2012-11-25 ENCOUNTER — Ambulatory Visit (INDEPENDENT_AMBULATORY_CARE_PROVIDER_SITE_OTHER): Payer: Medicare Other | Admitting: *Deleted

## 2012-11-25 DIAGNOSIS — Z8679 Personal history of other diseases of the circulatory system: Secondary | ICD-10-CM

## 2012-11-25 DIAGNOSIS — I82409 Acute embolism and thrombosis of unspecified deep veins of unspecified lower extremity: Secondary | ICD-10-CM

## 2012-11-25 DIAGNOSIS — Z7901 Long term (current) use of anticoagulants: Secondary | ICD-10-CM

## 2012-11-25 LAB — POCT INR: INR: 3

## 2012-12-05 DIAGNOSIS — B351 Tinea unguium: Secondary | ICD-10-CM | POA: Diagnosis not present

## 2012-12-05 DIAGNOSIS — M79609 Pain in unspecified limb: Secondary | ICD-10-CM | POA: Diagnosis not present

## 2012-12-05 DIAGNOSIS — F331 Major depressive disorder, recurrent, moderate: Secondary | ICD-10-CM | POA: Diagnosis not present

## 2012-12-27 DIAGNOSIS — E669 Obesity, unspecified: Secondary | ICD-10-CM | POA: Diagnosis not present

## 2012-12-27 DIAGNOSIS — I10 Essential (primary) hypertension: Secondary | ICD-10-CM | POA: Diagnosis not present

## 2012-12-27 DIAGNOSIS — IMO0001 Reserved for inherently not codable concepts without codable children: Secondary | ICD-10-CM | POA: Diagnosis not present

## 2012-12-27 DIAGNOSIS — E785 Hyperlipidemia, unspecified: Secondary | ICD-10-CM | POA: Diagnosis not present

## 2013-01-02 DIAGNOSIS — J410 Simple chronic bronchitis: Secondary | ICD-10-CM | POA: Diagnosis not present

## 2013-01-02 DIAGNOSIS — E119 Type 2 diabetes mellitus without complications: Secondary | ICD-10-CM | POA: Diagnosis not present

## 2013-01-06 ENCOUNTER — Ambulatory Visit (INDEPENDENT_AMBULATORY_CARE_PROVIDER_SITE_OTHER): Payer: Medicare Other | Admitting: *Deleted

## 2013-01-06 DIAGNOSIS — I82409 Acute embolism and thrombosis of unspecified deep veins of unspecified lower extremity: Secondary | ICD-10-CM | POA: Diagnosis not present

## 2013-01-06 DIAGNOSIS — Z7901 Long term (current) use of anticoagulants: Secondary | ICD-10-CM | POA: Diagnosis not present

## 2013-01-06 DIAGNOSIS — Z8679 Personal history of other diseases of the circulatory system: Secondary | ICD-10-CM | POA: Diagnosis not present

## 2013-01-06 LAB — POCT INR: INR: 4.2

## 2013-01-07 DIAGNOSIS — I10 Essential (primary) hypertension: Secondary | ICD-10-CM | POA: Diagnosis not present

## 2013-01-07 DIAGNOSIS — E559 Vitamin D deficiency, unspecified: Secondary | ICD-10-CM | POA: Diagnosis not present

## 2013-01-07 DIAGNOSIS — E669 Obesity, unspecified: Secondary | ICD-10-CM | POA: Diagnosis not present

## 2013-01-07 DIAGNOSIS — IMO0001 Reserved for inherently not codable concepts without codable children: Secondary | ICD-10-CM | POA: Diagnosis not present

## 2013-01-07 DIAGNOSIS — E785 Hyperlipidemia, unspecified: Secondary | ICD-10-CM | POA: Diagnosis not present

## 2013-01-20 ENCOUNTER — Ambulatory Visit (INDEPENDENT_AMBULATORY_CARE_PROVIDER_SITE_OTHER): Payer: Medicare Other | Admitting: *Deleted

## 2013-01-20 DIAGNOSIS — Z8679 Personal history of other diseases of the circulatory system: Secondary | ICD-10-CM

## 2013-01-20 DIAGNOSIS — E119 Type 2 diabetes mellitus without complications: Secondary | ICD-10-CM | POA: Diagnosis not present

## 2013-01-20 DIAGNOSIS — I10 Essential (primary) hypertension: Secondary | ICD-10-CM | POA: Diagnosis not present

## 2013-01-20 DIAGNOSIS — Z7901 Long term (current) use of anticoagulants: Secondary | ICD-10-CM

## 2013-01-20 DIAGNOSIS — E669 Obesity, unspecified: Secondary | ICD-10-CM | POA: Diagnosis not present

## 2013-01-20 DIAGNOSIS — I82409 Acute embolism and thrombosis of unspecified deep veins of unspecified lower extremity: Secondary | ICD-10-CM | POA: Diagnosis not present

## 2013-01-20 LAB — POCT INR: INR: 2.9

## 2013-01-27 DIAGNOSIS — I6789 Other cerebrovascular disease: Secondary | ICD-10-CM | POA: Diagnosis not present

## 2013-02-17 ENCOUNTER — Ambulatory Visit (INDEPENDENT_AMBULATORY_CARE_PROVIDER_SITE_OTHER): Payer: Medicare Other | Admitting: *Deleted

## 2013-02-17 DIAGNOSIS — Z8679 Personal history of other diseases of the circulatory system: Secondary | ICD-10-CM | POA: Diagnosis not present

## 2013-02-17 DIAGNOSIS — I82409 Acute embolism and thrombosis of unspecified deep veins of unspecified lower extremity: Secondary | ICD-10-CM

## 2013-02-17 DIAGNOSIS — Z7901 Long term (current) use of anticoagulants: Secondary | ICD-10-CM

## 2013-02-17 LAB — POCT INR: INR: 2.6

## 2013-02-20 DIAGNOSIS — F331 Major depressive disorder, recurrent, moderate: Secondary | ICD-10-CM | POA: Diagnosis not present

## 2013-03-12 DIAGNOSIS — M79609 Pain in unspecified limb: Secondary | ICD-10-CM | POA: Diagnosis not present

## 2013-03-12 DIAGNOSIS — B351 Tinea unguium: Secondary | ICD-10-CM | POA: Diagnosis not present

## 2013-03-24 ENCOUNTER — Ambulatory Visit (INDEPENDENT_AMBULATORY_CARE_PROVIDER_SITE_OTHER): Payer: Medicare Other | Admitting: *Deleted

## 2013-03-24 DIAGNOSIS — Z7901 Long term (current) use of anticoagulants: Secondary | ICD-10-CM | POA: Diagnosis not present

## 2013-03-24 DIAGNOSIS — Z8679 Personal history of other diseases of the circulatory system: Secondary | ICD-10-CM

## 2013-03-24 DIAGNOSIS — I82409 Acute embolism and thrombosis of unspecified deep veins of unspecified lower extremity: Secondary | ICD-10-CM | POA: Diagnosis not present

## 2013-03-24 DIAGNOSIS — Z5181 Encounter for therapeutic drug level monitoring: Secondary | ICD-10-CM

## 2013-03-24 LAB — POCT INR: INR: 2

## 2013-04-07 DIAGNOSIS — I6789 Other cerebrovascular disease: Secondary | ICD-10-CM | POA: Diagnosis not present

## 2013-04-07 DIAGNOSIS — I10 Essential (primary) hypertension: Secondary | ICD-10-CM | POA: Diagnosis not present

## 2013-04-07 DIAGNOSIS — E119 Type 2 diabetes mellitus without complications: Secondary | ICD-10-CM | POA: Diagnosis not present

## 2013-04-07 DIAGNOSIS — M1990 Unspecified osteoarthritis, unspecified site: Secondary | ICD-10-CM | POA: Diagnosis not present

## 2013-04-10 DIAGNOSIS — I10 Essential (primary) hypertension: Secondary | ICD-10-CM | POA: Diagnosis not present

## 2013-04-10 DIAGNOSIS — E559 Vitamin D deficiency, unspecified: Secondary | ICD-10-CM | POA: Diagnosis not present

## 2013-04-10 DIAGNOSIS — E785 Hyperlipidemia, unspecified: Secondary | ICD-10-CM | POA: Diagnosis not present

## 2013-04-10 DIAGNOSIS — IMO0001 Reserved for inherently not codable concepts without codable children: Secondary | ICD-10-CM | POA: Diagnosis not present

## 2013-05-05 ENCOUNTER — Ambulatory Visit (INDEPENDENT_AMBULATORY_CARE_PROVIDER_SITE_OTHER): Payer: Medicare Other | Admitting: *Deleted

## 2013-05-05 DIAGNOSIS — Z7901 Long term (current) use of anticoagulants: Secondary | ICD-10-CM | POA: Diagnosis not present

## 2013-05-05 DIAGNOSIS — Z8679 Personal history of other diseases of the circulatory system: Secondary | ICD-10-CM | POA: Diagnosis not present

## 2013-05-05 DIAGNOSIS — Z5181 Encounter for therapeutic drug level monitoring: Secondary | ICD-10-CM | POA: Diagnosis not present

## 2013-05-05 DIAGNOSIS — I82409 Acute embolism and thrombosis of unspecified deep veins of unspecified lower extremity: Secondary | ICD-10-CM

## 2013-05-05 LAB — POCT INR: INR: 2.6

## 2013-05-08 DIAGNOSIS — E119 Type 2 diabetes mellitus without complications: Secondary | ICD-10-CM | POA: Diagnosis not present

## 2013-05-08 DIAGNOSIS — F331 Major depressive disorder, recurrent, moderate: Secondary | ICD-10-CM | POA: Diagnosis not present

## 2013-05-08 DIAGNOSIS — I6789 Other cerebrovascular disease: Secondary | ICD-10-CM | POA: Diagnosis not present

## 2013-05-08 DIAGNOSIS — I10 Essential (primary) hypertension: Secondary | ICD-10-CM | POA: Diagnosis not present

## 2013-05-08 DIAGNOSIS — M1990 Unspecified osteoarthritis, unspecified site: Secondary | ICD-10-CM | POA: Diagnosis not present

## 2013-05-12 DIAGNOSIS — I6789 Other cerebrovascular disease: Secondary | ICD-10-CM | POA: Diagnosis not present

## 2013-06-04 DIAGNOSIS — I6789 Other cerebrovascular disease: Secondary | ICD-10-CM | POA: Diagnosis not present

## 2013-06-06 DIAGNOSIS — M549 Dorsalgia, unspecified: Secondary | ICD-10-CM | POA: Diagnosis not present

## 2013-06-06 DIAGNOSIS — E119 Type 2 diabetes mellitus without complications: Secondary | ICD-10-CM | POA: Diagnosis not present

## 2013-06-06 DIAGNOSIS — I6789 Other cerebrovascular disease: Secondary | ICD-10-CM | POA: Diagnosis not present

## 2013-06-06 DIAGNOSIS — M1990 Unspecified osteoarthritis, unspecified site: Secondary | ICD-10-CM | POA: Diagnosis not present

## 2013-06-09 DIAGNOSIS — M79609 Pain in unspecified limb: Secondary | ICD-10-CM | POA: Diagnosis not present

## 2013-06-09 DIAGNOSIS — B351 Tinea unguium: Secondary | ICD-10-CM | POA: Diagnosis not present

## 2013-06-18 ENCOUNTER — Ambulatory Visit (INDEPENDENT_AMBULATORY_CARE_PROVIDER_SITE_OTHER): Payer: Medicare Other | Admitting: *Deleted

## 2013-06-18 DIAGNOSIS — Z8679 Personal history of other diseases of the circulatory system: Secondary | ICD-10-CM

## 2013-06-18 DIAGNOSIS — Z7901 Long term (current) use of anticoagulants: Secondary | ICD-10-CM | POA: Diagnosis not present

## 2013-06-18 DIAGNOSIS — I82409 Acute embolism and thrombosis of unspecified deep veins of unspecified lower extremity: Secondary | ICD-10-CM | POA: Diagnosis not present

## 2013-06-18 DIAGNOSIS — Z5181 Encounter for therapeutic drug level monitoring: Secondary | ICD-10-CM

## 2013-06-18 LAB — POCT INR: INR: 3.4

## 2013-07-07 DIAGNOSIS — E119 Type 2 diabetes mellitus without complications: Secondary | ICD-10-CM | POA: Diagnosis not present

## 2013-07-07 DIAGNOSIS — I6789 Other cerebrovascular disease: Secondary | ICD-10-CM | POA: Diagnosis not present

## 2013-07-07 DIAGNOSIS — M1990 Unspecified osteoarthritis, unspecified site: Secondary | ICD-10-CM | POA: Diagnosis not present

## 2013-07-07 DIAGNOSIS — I10 Essential (primary) hypertension: Secondary | ICD-10-CM | POA: Diagnosis not present

## 2013-07-16 DIAGNOSIS — E669 Obesity, unspecified: Secondary | ICD-10-CM | POA: Diagnosis not present

## 2013-07-16 DIAGNOSIS — I10 Essential (primary) hypertension: Secondary | ICD-10-CM | POA: Diagnosis not present

## 2013-07-16 DIAGNOSIS — IMO0001 Reserved for inherently not codable concepts without codable children: Secondary | ICD-10-CM | POA: Diagnosis not present

## 2013-07-16 DIAGNOSIS — E785 Hyperlipidemia, unspecified: Secondary | ICD-10-CM | POA: Diagnosis not present

## 2013-07-17 ENCOUNTER — Ambulatory Visit (INDEPENDENT_AMBULATORY_CARE_PROVIDER_SITE_OTHER): Payer: Medicare Other | Admitting: *Deleted

## 2013-07-17 DIAGNOSIS — Z8679 Personal history of other diseases of the circulatory system: Secondary | ICD-10-CM | POA: Diagnosis not present

## 2013-07-17 DIAGNOSIS — Z7901 Long term (current) use of anticoagulants: Secondary | ICD-10-CM

## 2013-07-17 DIAGNOSIS — Z5181 Encounter for therapeutic drug level monitoring: Secondary | ICD-10-CM

## 2013-07-17 DIAGNOSIS — I82409 Acute embolism and thrombosis of unspecified deep veins of unspecified lower extremity: Secondary | ICD-10-CM

## 2013-07-17 LAB — POCT INR: INR: 2.5

## 2013-07-24 DIAGNOSIS — F331 Major depressive disorder, recurrent, moderate: Secondary | ICD-10-CM | POA: Diagnosis not present

## 2013-07-30 DIAGNOSIS — I6350 Cerebral infarction due to unspecified occlusion or stenosis of unspecified cerebral artery: Secondary | ICD-10-CM | POA: Diagnosis not present

## 2013-08-07 DIAGNOSIS — I10 Essential (primary) hypertension: Secondary | ICD-10-CM | POA: Diagnosis not present

## 2013-08-07 DIAGNOSIS — E119 Type 2 diabetes mellitus without complications: Secondary | ICD-10-CM | POA: Diagnosis not present

## 2013-08-07 DIAGNOSIS — G8929 Other chronic pain: Secondary | ICD-10-CM | POA: Diagnosis not present

## 2013-08-07 DIAGNOSIS — E1149 Type 2 diabetes mellitus with other diabetic neurological complication: Secondary | ICD-10-CM | POA: Diagnosis not present

## 2013-08-07 DIAGNOSIS — I69949 Monoplegia of lower limb following unspecified cerebrovascular disease affecting unspecified side: Secondary | ICD-10-CM | POA: Diagnosis not present

## 2013-08-13 DIAGNOSIS — M79609 Pain in unspecified limb: Secondary | ICD-10-CM | POA: Diagnosis not present

## 2013-08-13 DIAGNOSIS — B351 Tinea unguium: Secondary | ICD-10-CM | POA: Diagnosis not present

## 2013-08-14 ENCOUNTER — Ambulatory Visit (INDEPENDENT_AMBULATORY_CARE_PROVIDER_SITE_OTHER): Payer: Medicare Other | Admitting: *Deleted

## 2013-08-14 DIAGNOSIS — I82409 Acute embolism and thrombosis of unspecified deep veins of unspecified lower extremity: Secondary | ICD-10-CM | POA: Diagnosis not present

## 2013-08-14 DIAGNOSIS — Z7901 Long term (current) use of anticoagulants: Secondary | ICD-10-CM | POA: Diagnosis not present

## 2013-08-14 DIAGNOSIS — Z8679 Personal history of other diseases of the circulatory system: Secondary | ICD-10-CM

## 2013-08-14 DIAGNOSIS — Z5181 Encounter for therapeutic drug level monitoring: Secondary | ICD-10-CM

## 2013-08-14 LAB — POCT INR: INR: 2.8

## 2013-09-08 DIAGNOSIS — E119 Type 2 diabetes mellitus without complications: Secondary | ICD-10-CM | POA: Diagnosis not present

## 2013-09-08 DIAGNOSIS — M1990 Unspecified osteoarthritis, unspecified site: Secondary | ICD-10-CM | POA: Diagnosis not present

## 2013-09-08 DIAGNOSIS — G8929 Other chronic pain: Secondary | ICD-10-CM | POA: Diagnosis not present

## 2013-09-11 ENCOUNTER — Ambulatory Visit (INDEPENDENT_AMBULATORY_CARE_PROVIDER_SITE_OTHER): Payer: Medicare Other | Admitting: *Deleted

## 2013-09-11 DIAGNOSIS — I82409 Acute embolism and thrombosis of unspecified deep veins of unspecified lower extremity: Secondary | ICD-10-CM

## 2013-09-11 DIAGNOSIS — Z7901 Long term (current) use of anticoagulants: Secondary | ICD-10-CM

## 2013-09-11 DIAGNOSIS — Z8679 Personal history of other diseases of the circulatory system: Secondary | ICD-10-CM

## 2013-09-11 DIAGNOSIS — Z5181 Encounter for therapeutic drug level monitoring: Secondary | ICD-10-CM

## 2013-09-11 LAB — POCT INR: INR: 3.5

## 2013-10-09 ENCOUNTER — Ambulatory Visit (INDEPENDENT_AMBULATORY_CARE_PROVIDER_SITE_OTHER): Payer: Medicare Other | Admitting: *Deleted

## 2013-10-09 DIAGNOSIS — I82409 Acute embolism and thrombosis of unspecified deep veins of unspecified lower extremity: Secondary | ICD-10-CM

## 2013-10-09 DIAGNOSIS — Z5181 Encounter for therapeutic drug level monitoring: Secondary | ICD-10-CM | POA: Diagnosis not present

## 2013-10-09 DIAGNOSIS — Z8679 Personal history of other diseases of the circulatory system: Secondary | ICD-10-CM | POA: Diagnosis not present

## 2013-10-09 DIAGNOSIS — I6350 Cerebral infarction due to unspecified occlusion or stenosis of unspecified cerebral artery: Secondary | ICD-10-CM | POA: Diagnosis not present

## 2013-10-09 DIAGNOSIS — Z23 Encounter for immunization: Secondary | ICD-10-CM | POA: Diagnosis not present

## 2013-10-09 DIAGNOSIS — E785 Hyperlipidemia, unspecified: Secondary | ICD-10-CM | POA: Diagnosis not present

## 2013-10-09 DIAGNOSIS — Z7901 Long term (current) use of anticoagulants: Secondary | ICD-10-CM

## 2013-10-09 DIAGNOSIS — IMO0001 Reserved for inherently not codable concepts without codable children: Secondary | ICD-10-CM | POA: Diagnosis not present

## 2013-10-09 DIAGNOSIS — I10 Essential (primary) hypertension: Secondary | ICD-10-CM | POA: Diagnosis not present

## 2013-10-09 LAB — POCT INR: INR: 2.8

## 2013-10-16 DIAGNOSIS — I6350 Cerebral infarction due to unspecified occlusion or stenosis of unspecified cerebral artery: Secondary | ICD-10-CM | POA: Diagnosis not present

## 2013-10-16 DIAGNOSIS — I10 Essential (primary) hypertension: Secondary | ICD-10-CM | POA: Diagnosis not present

## 2013-10-16 DIAGNOSIS — E785 Hyperlipidemia, unspecified: Secondary | ICD-10-CM | POA: Diagnosis not present

## 2013-10-16 DIAGNOSIS — IMO0001 Reserved for inherently not codable concepts without codable children: Secondary | ICD-10-CM | POA: Diagnosis not present

## 2013-10-16 DIAGNOSIS — E669 Obesity, unspecified: Secondary | ICD-10-CM | POA: Diagnosis not present

## 2013-10-21 DIAGNOSIS — I69998 Other sequelae following unspecified cerebrovascular disease: Secondary | ICD-10-CM | POA: Diagnosis not present

## 2013-10-23 DIAGNOSIS — F331 Major depressive disorder, recurrent, moderate: Secondary | ICD-10-CM | POA: Diagnosis not present

## 2013-11-10 ENCOUNTER — Ambulatory Visit (INDEPENDENT_AMBULATORY_CARE_PROVIDER_SITE_OTHER): Payer: Medicare Other | Admitting: *Deleted

## 2013-11-10 DIAGNOSIS — Z7901 Long term (current) use of anticoagulants: Secondary | ICD-10-CM | POA: Diagnosis not present

## 2013-11-10 DIAGNOSIS — M79674 Pain in right toe(s): Secondary | ICD-10-CM | POA: Diagnosis not present

## 2013-11-10 DIAGNOSIS — I82409 Acute embolism and thrombosis of unspecified deep veins of unspecified lower extremity: Secondary | ICD-10-CM

## 2013-11-10 DIAGNOSIS — Z8679 Personal history of other diseases of the circulatory system: Secondary | ICD-10-CM | POA: Diagnosis not present

## 2013-11-10 DIAGNOSIS — B351 Tinea unguium: Secondary | ICD-10-CM | POA: Diagnosis not present

## 2013-11-10 DIAGNOSIS — M79675 Pain in left toe(s): Secondary | ICD-10-CM | POA: Diagnosis not present

## 2013-11-10 DIAGNOSIS — Z5181 Encounter for therapeutic drug level monitoring: Secondary | ICD-10-CM

## 2013-11-10 LAB — POCT INR: INR: 2.9

## 2013-12-08 ENCOUNTER — Ambulatory Visit (INDEPENDENT_AMBULATORY_CARE_PROVIDER_SITE_OTHER): Payer: Medicare Other | Admitting: *Deleted

## 2013-12-08 DIAGNOSIS — I82409 Acute embolism and thrombosis of unspecified deep veins of unspecified lower extremity: Secondary | ICD-10-CM

## 2013-12-08 DIAGNOSIS — Z8679 Personal history of other diseases of the circulatory system: Secondary | ICD-10-CM | POA: Diagnosis not present

## 2013-12-08 DIAGNOSIS — Z5181 Encounter for therapeutic drug level monitoring: Secondary | ICD-10-CM | POA: Diagnosis not present

## 2013-12-08 DIAGNOSIS — Z7901 Long term (current) use of anticoagulants: Secondary | ICD-10-CM | POA: Diagnosis not present

## 2013-12-08 LAB — POCT INR: INR: 3

## 2013-12-12 DIAGNOSIS — Z0000 Encounter for general adult medical examination without abnormal findings: Secondary | ICD-10-CM | POA: Diagnosis not present

## 2013-12-12 DIAGNOSIS — E1010 Type 1 diabetes mellitus with ketoacidosis without coma: Secondary | ICD-10-CM | POA: Diagnosis not present

## 2014-01-05 ENCOUNTER — Ambulatory Visit (INDEPENDENT_AMBULATORY_CARE_PROVIDER_SITE_OTHER): Payer: Medicare Other | Admitting: *Deleted

## 2014-01-05 DIAGNOSIS — I82409 Acute embolism and thrombosis of unspecified deep veins of unspecified lower extremity: Secondary | ICD-10-CM

## 2014-01-05 DIAGNOSIS — Z7901 Long term (current) use of anticoagulants: Secondary | ICD-10-CM | POA: Diagnosis not present

## 2014-01-05 DIAGNOSIS — Z5181 Encounter for therapeutic drug level monitoring: Secondary | ICD-10-CM | POA: Diagnosis not present

## 2014-01-05 DIAGNOSIS — Z8679 Personal history of other diseases of the circulatory system: Secondary | ICD-10-CM

## 2014-01-05 LAB — POCT INR: INR: 2.3

## 2014-01-13 DIAGNOSIS — E119 Type 2 diabetes mellitus without complications: Secondary | ICD-10-CM | POA: Diagnosis not present

## 2014-01-13 DIAGNOSIS — I10 Essential (primary) hypertension: Secondary | ICD-10-CM | POA: Diagnosis not present

## 2014-01-13 DIAGNOSIS — E785 Hyperlipidemia, unspecified: Secondary | ICD-10-CM | POA: Diagnosis not present

## 2014-01-20 DIAGNOSIS — H52223 Regular astigmatism, bilateral: Secondary | ICD-10-CM | POA: Diagnosis not present

## 2014-01-20 DIAGNOSIS — H5203 Hypermetropia, bilateral: Secondary | ICD-10-CM | POA: Diagnosis not present

## 2014-01-20 DIAGNOSIS — H25819 Combined forms of age-related cataract, unspecified eye: Secondary | ICD-10-CM | POA: Diagnosis not present

## 2014-01-20 DIAGNOSIS — H524 Presbyopia: Secondary | ICD-10-CM | POA: Diagnosis not present

## 2014-01-21 DIAGNOSIS — E1165 Type 2 diabetes mellitus with hyperglycemia: Secondary | ICD-10-CM | POA: Diagnosis not present

## 2014-01-21 DIAGNOSIS — M159 Polyosteoarthritis, unspecified: Secondary | ICD-10-CM | POA: Diagnosis not present

## 2014-01-21 DIAGNOSIS — E782 Mixed hyperlipidemia: Secondary | ICD-10-CM | POA: Diagnosis not present

## 2014-01-21 DIAGNOSIS — I10 Essential (primary) hypertension: Secondary | ICD-10-CM | POA: Diagnosis not present

## 2014-02-18 DIAGNOSIS — M159 Polyosteoarthritis, unspecified: Secondary | ICD-10-CM | POA: Diagnosis not present

## 2014-02-18 DIAGNOSIS — E1165 Type 2 diabetes mellitus with hyperglycemia: Secondary | ICD-10-CM | POA: Diagnosis not present

## 2014-02-19 DIAGNOSIS — F4311 Post-traumatic stress disorder, acute: Secondary | ICD-10-CM | POA: Diagnosis not present

## 2014-02-19 DIAGNOSIS — F331 Major depressive disorder, recurrent, moderate: Secondary | ICD-10-CM | POA: Diagnosis not present

## 2014-02-24 DIAGNOSIS — M79675 Pain in left toe(s): Secondary | ICD-10-CM | POA: Diagnosis not present

## 2014-02-24 DIAGNOSIS — B351 Tinea unguium: Secondary | ICD-10-CM | POA: Diagnosis not present

## 2014-02-24 DIAGNOSIS — M79674 Pain in right toe(s): Secondary | ICD-10-CM | POA: Diagnosis not present

## 2014-02-25 ENCOUNTER — Ambulatory Visit (INDEPENDENT_AMBULATORY_CARE_PROVIDER_SITE_OTHER): Payer: Medicare Other | Admitting: *Deleted

## 2014-02-25 DIAGNOSIS — Z8679 Personal history of other diseases of the circulatory system: Secondary | ICD-10-CM

## 2014-02-25 DIAGNOSIS — I82409 Acute embolism and thrombosis of unspecified deep veins of unspecified lower extremity: Secondary | ICD-10-CM | POA: Diagnosis not present

## 2014-02-25 DIAGNOSIS — Z5181 Encounter for therapeutic drug level monitoring: Secondary | ICD-10-CM

## 2014-02-25 DIAGNOSIS — Z7901 Long term (current) use of anticoagulants: Secondary | ICD-10-CM

## 2014-02-25 LAB — POCT INR: INR: 3.1

## 2014-03-12 DIAGNOSIS — E1165 Type 2 diabetes mellitus with hyperglycemia: Secondary | ICD-10-CM | POA: Diagnosis not present

## 2014-03-12 DIAGNOSIS — I6350 Cerebral infarction due to unspecified occlusion or stenosis of unspecified cerebral artery: Secondary | ICD-10-CM | POA: Diagnosis not present

## 2014-03-12 DIAGNOSIS — L259 Unspecified contact dermatitis, unspecified cause: Secondary | ICD-10-CM | POA: Diagnosis not present

## 2014-03-12 DIAGNOSIS — M5489 Other dorsalgia: Secondary | ICD-10-CM | POA: Diagnosis not present

## 2014-03-23 DIAGNOSIS — M159 Polyosteoarthritis, unspecified: Secondary | ICD-10-CM | POA: Diagnosis not present

## 2014-03-23 DIAGNOSIS — E1165 Type 2 diabetes mellitus with hyperglycemia: Secondary | ICD-10-CM | POA: Diagnosis not present

## 2014-03-25 ENCOUNTER — Encounter: Payer: Self-pay | Admitting: Cardiology

## 2014-03-25 ENCOUNTER — Ambulatory Visit (INDEPENDENT_AMBULATORY_CARE_PROVIDER_SITE_OTHER): Payer: Medicare Other | Admitting: Cardiology

## 2014-03-25 ENCOUNTER — Ambulatory Visit (INDEPENDENT_AMBULATORY_CARE_PROVIDER_SITE_OTHER): Payer: Medicare Other | Admitting: *Deleted

## 2014-03-25 VITALS — BP 124/52 | HR 95 | Ht 67.0 in | Wt 172.4 lb

## 2014-03-25 DIAGNOSIS — I82409 Acute embolism and thrombosis of unspecified deep veins of unspecified lower extremity: Secondary | ICD-10-CM | POA: Diagnosis not present

## 2014-03-25 DIAGNOSIS — Z8673 Personal history of transient ischemic attack (TIA), and cerebral infarction without residual deficits: Secondary | ICD-10-CM

## 2014-03-25 DIAGNOSIS — Z136 Encounter for screening for cardiovascular disorders: Secondary | ICD-10-CM | POA: Diagnosis not present

## 2014-03-25 DIAGNOSIS — I10 Essential (primary) hypertension: Secondary | ICD-10-CM

## 2014-03-25 DIAGNOSIS — Z7901 Long term (current) use of anticoagulants: Secondary | ICD-10-CM

## 2014-03-25 DIAGNOSIS — Z8679 Personal history of other diseases of the circulatory system: Secondary | ICD-10-CM | POA: Diagnosis not present

## 2014-03-25 DIAGNOSIS — Z5181 Encounter for therapeutic drug level monitoring: Secondary | ICD-10-CM

## 2014-03-25 LAB — POCT INR: INR: 2.6

## 2014-03-25 NOTE — Progress Notes (Signed)
Cardiology Office Note  Date: 03/25/2014   ID: [name], DOB 1945-09-19, MRN [id number]  PCP: [name], MD  Primary Cardiologist: Rozann Lesches, MD   Chief Complaint  Patient presents with  . History of recurrent DVT  . Anticoagulation clinic patient    History of Present Illness: [name] is a 69 y.o. female last seen in the office by Ms. Lawrence NP back in June 2013. She is a resident of [facility], and is followed in our anticoagulation clinic on long-term Coumadin under the direction of Dr. Legrand Rams for recurrent DVT and previous stroke.  She presents today for a routine follow-up visit, otherwise has had interval management by Dr. Legrand Rams. She denies any obvious bleeding problems, no changes in stool color or consistency, no abdominal pain or chest pain. She uses a walker, has not had any recent falls. Has chronic deficits related to prior stroke including left leg edema which has not changed by her report.  Follow-up tracing is noted below.  She tells me that she has other routine lab work per Dr. Legrand Rams.   Past Medical History  Diagnosis Date  . DVT, lower extremity, recurrent     Long-term Coumadin per Dr. Legrand Rams  . Type 2 diabetes mellitus   . Anxiety   . Depression     History of psychosis and previous suicide attempt  . Hemiplegia   . GERD (gastroesophageal reflux disease)   . History of stroke     Acute infarct and right cerebral white matter small vessel disease 12/10  . Arthritis   . Essential hypertension     Past Surgical History  Procedure Laterality Date  . Back surgery      Current Outpatient Prescriptions  Medication Sig Dispense Refill  . acetaminophen (TYLENOL) 325 MG tablet Take 650 mg by mouth every 6 (six) hours as needed. For fever > 101     . albuterol (PROVENTIL) (2.5 MG/3ML) 0.083% nebulizer solution Take 2.5 mg by nebulization every 2 (two) hours as needed. For wheezing     . Carboxymethylcellul-Glycerin  (REFRESH OPTIVE OP) Apply to eye.    . clonazePAM (KLONOPIN) 0.5 MG tablet Take 2 tablets (1 mg total) by mouth 3 (three) times daily. 90 tablet 0  . DULoxetine (CYMBALTA) 60 MG capsule Take 120 mg by mouth daily.     . enalapril (VASOTEC) 5 MG tablet Take 5 mg by mouth every morning.     Marland Kitchen HYDROcodone-acetaminophen (NORCO) 5-325 MG per tablet Take 1 tablet by mouth 4 (four) times daily.    . insulin aspart (NOVOLOG) 100 UNIT/ML injection Inject 1-6 Units into the skin 3 (three) times daily before meals. Addition to the normal 12 unit dose: If BG 150-200: add 1 unit, 201-250: add 2 units, 251-300: add 3 units, 301-350: add 4 units, 351-400: add 5 units, >400: add 6 units     . insulin glargine (LANTUS) 100 UNIT/ML injection Inject 90 Units into the skin at bedtime.     . Insulin Syringe-Needle U-100 30G X 5/16" 0.5 ML MISC by Does not apply route.    Marland Kitchen ipratropium (ATROVENT) 0.02 % nebulizer solution Take 500 mcg by nebulization 4 (four) times daily as needed. Shortness of breath     . loperamide (IMODIUM) 2 MG capsule Take 4 mg by mouth 4 (four) times daily as needed. Take 2 capsules initially then take 2 after each loose stool per MAR     . metFORMIN (GLUCOPHAGE) 500 MG tablet Take  500 mg by mouth 2 (two) times daily with a meal.      . metoCLOPramide (REGLAN) 10 MG tablet Take 10 mg by mouth 3 (three) times daily before meals.     . metoprolol succinate (TOPROL-XL) 25 MG 24 hr tablet Take 25 mg by mouth 2 (two) times daily.     . mirtazapine (REMERON) 30 MG tablet Take 30 mg by mouth at bedtime.      Marland Kitchen omeprazole (PRILOSEC) 20 MG capsule Take 20 mg by mouth 2 (two) times daily. Take 1 po BID x 2 weeks then back down to once a day     . ondansetron (ZOFRAN-ODT) 4 MG disintegrating tablet Take 4 mg by mouth every 8 (eight) hours as needed. Nausea and Vomiting     . polyethylene glycol powder (GLYCOLAX/MIRALAX) powder Take 17 g by mouth daily.      . sitaGLIPtin (JANUVIA) 100 MG tablet Take 100 mg  by mouth daily.     . solifenacin (VESICARE) 5 MG tablet Take 5 mg by mouth daily.    Marland Kitchen tiZANidine (ZANAFLEX) 2 MG tablet Take 2 mg by mouth daily.    Marland Kitchen warfarin (COUMADIN) 2.5 MG tablet TAKE 1 TABLET BY MOUTH ON MONDAYS. TAKE ALONG WITH THE 5MG . 4 tablet 3  . warfarin (COUMADIN) 5 MG tablet TAKE ONE TABLETSBY MOUTH ONCE DAILY EXCEPT ADD A 2.5MG  ON MONDAYS. 26 tablet 3  . zolpidem (AMBIEN) 5 MG tablet Take 1 tablet (5 mg total) by mouth at bedtime as needed for sleep. Sleep 30 tablet 1  . ARIPiprazole (ABILIFY) 2 MG tablet Take 1 tablet (2 mg total) by mouth daily. 30 tablet 0   No current facility-administered medications for this visit.    Allergies:  Sulfa antibiotics and Sulfonamide derivatives   Social History: The patient  reports that she has never smoked. She does not have any smokeless tobacco history on file. She reports that she does not drink alcohol or use illicit drugs.   ROS:  Please see the history of present illness. Otherwise, complete review of systems is positive for chronic left leg swelling and left-sided weakness.  All other systems are reviewed and negative.    Physical Exam: VS:  BP 124/52 mmHg  Pulse 95  Ht 5\' 7"  (1.702 m)  Wt 172 lb 6.4 oz (78.2 kg)  BMI 27.00 kg/m2  SpO2 100%, BMI Body mass index is 27 kg/(m^2).  Wt Readings from Last 3 Encounters:  03/25/14 172 lb 6.4 oz (78.2 kg)  06/27/11 164 lb (74.39 kg)  05/25/11 154 lb (69.854 kg)     Overweight woman, appears comfortable at rest. HEENT: Conjunctiva and lids normal, oropharynx clear. Neck: Supple, no elevated JVP or carotid bruits, no thyromegaly. Lungs: Clear to auscultation, nonlabored breathing at rest. Cardiac: Regular rate and rhythm, no S3, 2/6 systolic murmur at base, no pericardial rub. Abdomen: Soft, nontender, bowel sounds present. Extremities: 1+ leg edema left greater than right, distal pulses 2+. Skin: Warm and dry. Musculoskeletal: No kyphosis. Neuropsychiatric: Alert and  oriented x3, affect grossly appropriate.    ECG: ECG is ordered today and reviewed showing sinus rhythm with possible old inferior infarct pattern and decreased R wave progression, both findings unchanged compared to tracing from 2013.  Recent Labwork:  INR 3.1 on 02/25/2014  Other Studies Reviewed Today:  Echocardiogram 01/04/2009: Study Conclusions  - Left ventricle: The cavity size was normal. Wall thickness was  increased in a pattern of moderate LVH. Systolic function was  normal.  The estimated ejection fraction was in the range of 60% to  65%. Wall motion was normal; there were no regional wall motion  abnormalities. - Aortic valve: Moderately calcified annulus. Trileaflet; mildly  calcified leaflets. - Mitral valve: Mildly to moderately calcified annulus. - Right ventricle: The cavity size was normal. Wall thickness was  mildly increased. - Pericardium, extracardiac: A trivial pericardial effusion was  identified.   ASSESSMENT AND PLAN:  1. Routine follow-up as part of anticoagulation clinic management, patient on long-term Coumadin per Dr. Legrand Rams with history of recurrent leg DVT and previous stroke. Denies any bleeding problems, reports routine lab work with Dr. Legrand Rams. Recommend observation for any bleeding, check CBCs and heme check stools per primary care. Continue follow-up in the Coumadin clinic.  2. Abnormal ECG, chronic finding. No clear history of previous myocardial infarction, no chest pain symptoms.  3. Essential hypertension, managed by Dr. Legrand Rams. Blood pressure is normal on current regimen.  4. Prior history of stroke with left-sided residua.  Current medicines are reviewed at length with the patient today.  The patient does not have concerns regarding medicines.   Disposition: FU with Ms. Lawrence NP in 1 year.   Signed, Satira Sark, MD, [facility] 03/25/2014 9:06 AM    [facility] [place]. [address] [place], [place] [address] Phone: [phone]; Fax: [phone]

## 2014-03-25 NOTE — Patient Instructions (Signed)
Your physician wants you to follow-up in: 1 year with Jory Sims, NP. You will receive a reminder letter in the mail two months in advance. If you don't receive a letter, please call our office to schedule the follow-up appointment.  Your physician recommends that you continue on your current medications as directed. Please refer to the Current Medication list given to you today.  Thank you for choosing [facility]!

## 2014-04-17 DIAGNOSIS — E1165 Type 2 diabetes mellitus with hyperglycemia: Secondary | ICD-10-CM | POA: Diagnosis not present

## 2014-04-17 DIAGNOSIS — Z713 Dietary counseling and surveillance: Secondary | ICD-10-CM | POA: Diagnosis not present

## 2014-04-17 DIAGNOSIS — I10 Essential (primary) hypertension: Secondary | ICD-10-CM | POA: Diagnosis not present

## 2014-04-17 DIAGNOSIS — E782 Mixed hyperlipidemia: Secondary | ICD-10-CM | POA: Diagnosis not present

## 2014-04-20 DIAGNOSIS — M159 Polyosteoarthritis, unspecified: Secondary | ICD-10-CM | POA: Diagnosis not present

## 2014-04-20 DIAGNOSIS — E1165 Type 2 diabetes mellitus with hyperglycemia: Secondary | ICD-10-CM | POA: Diagnosis not present

## 2014-04-23 DIAGNOSIS — I10 Essential (primary) hypertension: Secondary | ICD-10-CM | POA: Diagnosis not present

## 2014-04-23 DIAGNOSIS — E6609 Other obesity due to excess calories: Secondary | ICD-10-CM | POA: Diagnosis not present

## 2014-04-23 DIAGNOSIS — E1165 Type 2 diabetes mellitus with hyperglycemia: Secondary | ICD-10-CM | POA: Diagnosis not present

## 2014-04-29 ENCOUNTER — Ambulatory Visit (INDEPENDENT_AMBULATORY_CARE_PROVIDER_SITE_OTHER): Payer: Medicare Other | Admitting: *Deleted

## 2014-04-29 DIAGNOSIS — Z8679 Personal history of other diseases of the circulatory system: Secondary | ICD-10-CM

## 2014-04-29 DIAGNOSIS — Z794 Long term (current) use of insulin: Secondary | ICD-10-CM | POA: Diagnosis not present

## 2014-04-29 DIAGNOSIS — Z7901 Long term (current) use of anticoagulants: Secondary | ICD-10-CM | POA: Diagnosis not present

## 2014-04-29 DIAGNOSIS — I82409 Acute embolism and thrombosis of unspecified deep veins of unspecified lower extremity: Secondary | ICD-10-CM | POA: Diagnosis not present

## 2014-04-29 DIAGNOSIS — Z5181 Encounter for therapeutic drug level monitoring: Secondary | ICD-10-CM

## 2014-04-29 DIAGNOSIS — E119 Type 2 diabetes mellitus without complications: Secondary | ICD-10-CM | POA: Diagnosis not present

## 2014-04-29 LAB — POCT INR: INR: 1.8

## 2014-05-07 DIAGNOSIS — F331 Major depressive disorder, recurrent, moderate: Secondary | ICD-10-CM | POA: Diagnosis not present

## 2014-05-12 ENCOUNTER — Other Ambulatory Visit: Payer: Self-pay | Admitting: Cardiology

## 2014-05-14 DIAGNOSIS — J019 Acute sinusitis, unspecified: Secondary | ICD-10-CM | POA: Diagnosis not present

## 2014-05-14 DIAGNOSIS — E1165 Type 2 diabetes mellitus with hyperglycemia: Secondary | ICD-10-CM | POA: Diagnosis not present

## 2014-05-14 DIAGNOSIS — J309 Allergic rhinitis, unspecified: Secondary | ICD-10-CM | POA: Diagnosis not present

## 2014-05-18 DIAGNOSIS — M79675 Pain in left toe(s): Secondary | ICD-10-CM | POA: Diagnosis not present

## 2014-05-18 DIAGNOSIS — M79671 Pain in right foot: Secondary | ICD-10-CM | POA: Diagnosis not present

## 2014-05-18 DIAGNOSIS — B351 Tinea unguium: Secondary | ICD-10-CM | POA: Diagnosis not present

## 2014-05-19 DIAGNOSIS — J019 Acute sinusitis, unspecified: Secondary | ICD-10-CM | POA: Diagnosis not present

## 2014-05-19 DIAGNOSIS — J309 Allergic rhinitis, unspecified: Secondary | ICD-10-CM | POA: Diagnosis not present

## 2014-05-19 DIAGNOSIS — E1165 Type 2 diabetes mellitus with hyperglycemia: Secondary | ICD-10-CM | POA: Diagnosis not present

## 2014-05-22 DIAGNOSIS — E1165 Type 2 diabetes mellitus with hyperglycemia: Secondary | ICD-10-CM | POA: Diagnosis not present

## 2014-05-22 DIAGNOSIS — M159 Polyosteoarthritis, unspecified: Secondary | ICD-10-CM | POA: Diagnosis not present

## 2014-05-25 ENCOUNTER — Ambulatory Visit (INDEPENDENT_AMBULATORY_CARE_PROVIDER_SITE_OTHER): Payer: Medicare Other | Admitting: *Deleted

## 2014-05-25 DIAGNOSIS — Z5181 Encounter for therapeutic drug level monitoring: Secondary | ICD-10-CM | POA: Diagnosis not present

## 2014-05-25 DIAGNOSIS — Z7901 Long term (current) use of anticoagulants: Secondary | ICD-10-CM | POA: Diagnosis not present

## 2014-05-25 DIAGNOSIS — I82409 Acute embolism and thrombosis of unspecified deep veins of unspecified lower extremity: Secondary | ICD-10-CM | POA: Diagnosis not present

## 2014-05-25 DIAGNOSIS — Z8679 Personal history of other diseases of the circulatory system: Secondary | ICD-10-CM

## 2014-05-25 LAB — POCT INR: INR: 2.5

## 2014-06-01 DIAGNOSIS — G8190 Hemiplegia, unspecified affecting unspecified side: Secondary | ICD-10-CM | POA: Diagnosis not present

## 2014-06-01 DIAGNOSIS — E1165 Type 2 diabetes mellitus with hyperglycemia: Secondary | ICD-10-CM | POA: Diagnosis not present

## 2014-06-01 DIAGNOSIS — M5489 Other dorsalgia: Secondary | ICD-10-CM | POA: Diagnosis not present

## 2014-06-01 DIAGNOSIS — I10 Essential (primary) hypertension: Secondary | ICD-10-CM | POA: Diagnosis not present

## 2014-06-01 DIAGNOSIS — Z23 Encounter for immunization: Secondary | ICD-10-CM | POA: Diagnosis not present

## 2014-06-29 ENCOUNTER — Ambulatory Visit (INDEPENDENT_AMBULATORY_CARE_PROVIDER_SITE_OTHER): Payer: Medicare Other | Admitting: *Deleted

## 2014-06-29 DIAGNOSIS — I82409 Acute embolism and thrombosis of unspecified deep veins of unspecified lower extremity: Secondary | ICD-10-CM

## 2014-06-29 DIAGNOSIS — Z7901 Long term (current) use of anticoagulants: Secondary | ICD-10-CM

## 2014-06-29 DIAGNOSIS — Z8679 Personal history of other diseases of the circulatory system: Secondary | ICD-10-CM | POA: Diagnosis not present

## 2014-06-29 DIAGNOSIS — Z5181 Encounter for therapeutic drug level monitoring: Secondary | ICD-10-CM

## 2014-06-29 LAB — POCT INR: INR: 2.5

## 2014-07-02 DIAGNOSIS — E1165 Type 2 diabetes mellitus with hyperglycemia: Secondary | ICD-10-CM | POA: Diagnosis not present

## 2014-07-02 DIAGNOSIS — M5489 Other dorsalgia: Secondary | ICD-10-CM | POA: Diagnosis not present

## 2014-07-02 DIAGNOSIS — I10 Essential (primary) hypertension: Secondary | ICD-10-CM | POA: Diagnosis not present

## 2014-07-02 DIAGNOSIS — M159 Polyosteoarthritis, unspecified: Secondary | ICD-10-CM | POA: Diagnosis not present

## 2014-07-02 DIAGNOSIS — G8190 Hemiplegia, unspecified affecting unspecified side: Secondary | ICD-10-CM | POA: Diagnosis not present

## 2014-07-15 DIAGNOSIS — E1165 Type 2 diabetes mellitus with hyperglycemia: Secondary | ICD-10-CM | POA: Diagnosis not present

## 2014-07-15 DIAGNOSIS — E785 Hyperlipidemia, unspecified: Secondary | ICD-10-CM | POA: Diagnosis not present

## 2014-07-15 DIAGNOSIS — Z713 Dietary counseling and surveillance: Secondary | ICD-10-CM | POA: Diagnosis not present

## 2014-07-15 DIAGNOSIS — I10 Essential (primary) hypertension: Secondary | ICD-10-CM | POA: Diagnosis not present

## 2014-07-16 DIAGNOSIS — R32 Unspecified urinary incontinence: Secondary | ICD-10-CM | POA: Diagnosis not present

## 2014-07-29 DIAGNOSIS — I10 Essential (primary) hypertension: Secondary | ICD-10-CM | POA: Diagnosis not present

## 2014-07-29 DIAGNOSIS — E1159 Type 2 diabetes mellitus with other circulatory complications: Secondary | ICD-10-CM | POA: Diagnosis not present

## 2014-07-29 DIAGNOSIS — E785 Hyperlipidemia, unspecified: Secondary | ICD-10-CM | POA: Diagnosis not present

## 2014-07-29 DIAGNOSIS — E6609 Other obesity due to excess calories: Secondary | ICD-10-CM | POA: Diagnosis not present

## 2014-08-01 DIAGNOSIS — E1165 Type 2 diabetes mellitus with hyperglycemia: Secondary | ICD-10-CM | POA: Diagnosis not present

## 2014-08-01 DIAGNOSIS — M159 Polyosteoarthritis, unspecified: Secondary | ICD-10-CM | POA: Diagnosis not present

## 2014-08-10 ENCOUNTER — Ambulatory Visit (INDEPENDENT_AMBULATORY_CARE_PROVIDER_SITE_OTHER): Payer: Medicare Other | Admitting: *Deleted

## 2014-08-10 DIAGNOSIS — I82409 Acute embolism and thrombosis of unspecified deep veins of unspecified lower extremity: Secondary | ICD-10-CM | POA: Diagnosis not present

## 2014-08-10 DIAGNOSIS — Z8679 Personal history of other diseases of the circulatory system: Secondary | ICD-10-CM

## 2014-08-10 DIAGNOSIS — Z5181 Encounter for therapeutic drug level monitoring: Secondary | ICD-10-CM

## 2014-08-10 DIAGNOSIS — Z7901 Long term (current) use of anticoagulants: Secondary | ICD-10-CM | POA: Diagnosis not present

## 2014-08-10 DIAGNOSIS — B351 Tinea unguium: Secondary | ICD-10-CM | POA: Diagnosis not present

## 2014-08-10 DIAGNOSIS — M79675 Pain in left toe(s): Secondary | ICD-10-CM | POA: Diagnosis not present

## 2014-08-10 DIAGNOSIS — M79674 Pain in right toe(s): Secondary | ICD-10-CM | POA: Diagnosis not present

## 2014-08-10 LAB — POCT INR: INR: 3

## 2014-09-01 DIAGNOSIS — M159 Polyosteoarthritis, unspecified: Secondary | ICD-10-CM | POA: Diagnosis not present

## 2014-09-01 DIAGNOSIS — E1165 Type 2 diabetes mellitus with hyperglycemia: Secondary | ICD-10-CM | POA: Diagnosis not present

## 2014-09-02 DIAGNOSIS — F333 Major depressive disorder, recurrent, severe with psychotic symptoms: Secondary | ICD-10-CM | POA: Diagnosis not present

## 2014-09-03 DIAGNOSIS — F331 Major depressive disorder, recurrent, moderate: Secondary | ICD-10-CM | POA: Diagnosis not present

## 2014-09-14 ENCOUNTER — Emergency Department (HOSPITAL_COMMUNITY): Payer: Medicare Other

## 2014-09-14 ENCOUNTER — Ambulatory Visit (INDEPENDENT_AMBULATORY_CARE_PROVIDER_SITE_OTHER): Payer: Medicare Other | Admitting: *Deleted

## 2014-09-14 ENCOUNTER — Emergency Department (HOSPITAL_COMMUNITY)
Admission: EM | Admit: 2014-09-14 | Discharge: 2014-09-14 | Disposition: A | Discharge status: Home / Self Care | Payer: Medicare Other | Attending: Emergency Medicine | Admitting: Emergency Medicine

## 2014-09-14 ENCOUNTER — Encounter (HOSPITAL_COMMUNITY): Payer: Self-pay | Admitting: *Deleted

## 2014-09-14 DIAGNOSIS — N838 Other noninflammatory disorders of ovary, fallopian tube and broad ligament: Secondary | ICD-10-CM | POA: Insufficient documentation

## 2014-09-14 DIAGNOSIS — Z8669 Personal history of other diseases of the nervous system and sense organs: Secondary | ICD-10-CM | POA: Diagnosis not present

## 2014-09-14 DIAGNOSIS — K219 Gastro-esophageal reflux disease without esophagitis: Secondary | ICD-10-CM | POA: Diagnosis not present

## 2014-09-14 DIAGNOSIS — E119 Type 2 diabetes mellitus without complications: Secondary | ICD-10-CM | POA: Diagnosis not present

## 2014-09-14 DIAGNOSIS — N390 Urinary tract infection, site not specified: Secondary | ICD-10-CM | POA: Diagnosis not present

## 2014-09-14 DIAGNOSIS — K8020 Calculus of gallbladder without cholecystitis without obstruction: Secondary | ICD-10-CM | POA: Diagnosis not present

## 2014-09-14 DIAGNOSIS — I10 Essential (primary) hypertension: Secondary | ICD-10-CM | POA: Insufficient documentation

## 2014-09-14 DIAGNOSIS — F419 Anxiety disorder, unspecified: Secondary | ICD-10-CM | POA: Diagnosis not present

## 2014-09-14 DIAGNOSIS — Z86718 Personal history of other venous thrombosis and embolism: Secondary | ICD-10-CM | POA: Diagnosis not present

## 2014-09-14 DIAGNOSIS — Z79899 Other long term (current) drug therapy: Secondary | ICD-10-CM | POA: Diagnosis not present

## 2014-09-14 DIAGNOSIS — R1030 Lower abdominal pain, unspecified: Secondary | ICD-10-CM | POA: Diagnosis not present

## 2014-09-14 DIAGNOSIS — Z8673 Personal history of transient ischemic attack (TIA), and cerebral infarction without residual deficits: Secondary | ICD-10-CM | POA: Insufficient documentation

## 2014-09-14 DIAGNOSIS — Z8679 Personal history of other diseases of the circulatory system: Secondary | ICD-10-CM | POA: Diagnosis not present

## 2014-09-14 DIAGNOSIS — F329 Major depressive disorder, single episode, unspecified: Secondary | ICD-10-CM | POA: Diagnosis not present

## 2014-09-14 DIAGNOSIS — R109 Unspecified abdominal pain: Secondary | ICD-10-CM

## 2014-09-14 DIAGNOSIS — M549 Dorsalgia, unspecified: Secondary | ICD-10-CM | POA: Diagnosis present

## 2014-09-14 DIAGNOSIS — Z5181 Encounter for therapeutic drug level monitoring: Secondary | ICD-10-CM

## 2014-09-14 DIAGNOSIS — M1990 Unspecified osteoarthritis, unspecified site: Secondary | ICD-10-CM | POA: Insufficient documentation

## 2014-09-14 DIAGNOSIS — Z7901 Long term (current) use of anticoagulants: Secondary | ICD-10-CM | POA: Diagnosis not present

## 2014-09-14 DIAGNOSIS — E860 Dehydration: Secondary | ICD-10-CM | POA: Diagnosis not present

## 2014-09-14 DIAGNOSIS — Z794 Long term (current) use of insulin: Secondary | ICD-10-CM | POA: Insufficient documentation

## 2014-09-14 DIAGNOSIS — N8320 Unspecified ovarian cysts: Secondary | ICD-10-CM | POA: Diagnosis not present

## 2014-09-14 DIAGNOSIS — I82409 Acute embolism and thrombosis of unspecified deep veins of unspecified lower extremity: Secondary | ICD-10-CM | POA: Diagnosis not present

## 2014-09-14 DIAGNOSIS — Z791 Long term (current) use of non-steroidal anti-inflammatories (NSAID): Secondary | ICD-10-CM | POA: Insufficient documentation

## 2014-09-14 DIAGNOSIS — K7689 Other specified diseases of liver: Secondary | ICD-10-CM | POA: Diagnosis not present

## 2014-09-14 DIAGNOSIS — K449 Diaphragmatic hernia without obstruction or gangrene: Secondary | ICD-10-CM | POA: Diagnosis not present

## 2014-09-14 LAB — COMPREHENSIVE METABOLIC PANEL
ALT: 11 U/L — ABNORMAL LOW (ref 14–54)
AST: 17 U/L (ref 15–41)
Albumin: 3.9 g/dL (ref 3.5–5.0)
Alkaline Phosphatase: 87 U/L (ref 38–126)
Anion gap: 9 (ref 5–15)
BUN: 12 mg/dL (ref 6–20)
CO2: 27 mmol/L (ref 22–32)
Calcium: 8.3 mg/dL — ABNORMAL LOW (ref 8.9–10.3)
Chloride: 99 mmol/L — ABNORMAL LOW (ref 101–111)
Creatinine, Ser: 0.63 mg/dL (ref 0.44–1.00)
GFR calc Af Amer: >60 mL/min (ref >60)
GFR calc non Af Amer: >60 mL/min (ref >60)
Glucose, Bld: 123 mg/dL — ABNORMAL HIGH (ref 65–99)
Potassium: 3.6 mmol/L (ref 3.5–5.1)
Sodium: 135 mmol/L (ref 135–145)
Total Bilirubin: 0.2 mg/dL — ABNORMAL LOW (ref 0.3–1.2)
Total Protein: 7.6 g/dL (ref 6.5–8.1)

## 2014-09-14 LAB — CBC WITH DIFFERENTIAL/PLATELET
Basophils Absolute: 0.1 10*3/uL (ref 0.0–0.1)
Basophils Relative: 1 % (ref 0–1)
Eosinophils Absolute: 0.2 10*3/uL (ref 0.0–0.7)
Eosinophils Relative: 2 % (ref 0–5)
HCT: 32 % — ABNORMAL LOW (ref 36.0–46.0)
Hemoglobin: 9.1 g/dL — ABNORMAL LOW (ref 12.0–15.0)
Lymphocytes Relative: 26 % (ref 12–46)
Lymphs Abs: 2.5 10*3/uL (ref 0.7–4.0)
MCH: 19.8 pg — ABNORMAL LOW (ref 26.0–34.0)
MCHC: 28.4 g/dL — ABNORMAL LOW (ref 30.0–36.0)
MCV: 69.7 fL — ABNORMAL LOW (ref 78.0–100.0)
Monocytes Absolute: 0.7 10*3/uL (ref 0.1–1.0)
Monocytes Relative: 7 % (ref 3–12)
Neutro Abs: 6.1 10*3/uL (ref 1.7–7.7)
Neutrophils Relative %: 64 % (ref 43–77)
Platelets: 318 10*3/uL (ref 150–400)
RBC: 4.59 MIL/uL (ref 3.87–5.11)
RDW: 17.8 % — ABNORMAL HIGH (ref 11.5–15.5)
WBC: 9.5 10*3/uL (ref 4.0–10.5)

## 2014-09-14 LAB — POC OCCULT BLOOD, ED: Fecal Occult Bld: NEGATIVE

## 2014-09-14 LAB — URINALYSIS, ROUTINE W REFLEX MICROSCOPIC
Bilirubin Urine: NEGATIVE
Glucose, UA: >1000 mg/dL — AB
Hgb urine dipstick: NEGATIVE
Ketones, ur: NEGATIVE mg/dL
Nitrite: NEGATIVE
Protein, ur: NEGATIVE mg/dL
Specific Gravity, Urine: <1.005 — ABNORMAL LOW (ref 1.005–1.030)
Urobilinogen, UA: 0.2 mg/dL (ref 0.0–1.0)
pH: 6 (ref 5.0–8.0)

## 2014-09-14 LAB — URINE MICROSCOPIC-ADD ON

## 2014-09-14 LAB — POCT INR: INR: 3.3

## 2014-09-14 LAB — PROTIME-INR
INR: 2.63 — ABNORMAL HIGH (ref 0.00–1.49)
Prothrombin Time: 27.8 seconds — ABNORMAL HIGH (ref 11.6–15.2)

## 2014-09-14 LAB — LIPASE, BLOOD: Lipase: 14 U/L — ABNORMAL LOW (ref 22–51)

## 2014-09-14 MED ORDER — DEXTROSE 5 % IV SOLN
1.0000 g | Freq: Once | INTRAVENOUS | Status: AC
  Administered 2014-09-14: 1 g via INTRAVENOUS
  Filled 2014-09-14: qty 10

## 2014-09-14 MED ORDER — CEPHALEXIN 500 MG PO CAPS: 500.0000 mg | ORAL_CAPSULE | Freq: Four times a day (QID) | ORAL | Status: DC

## 2014-09-14 NOTE — Discharge Instructions (Signed)
Urinary Tract Infection Return for an ultrasound of the ovaries tomorrow. Take the antibiotics as prescribed. Return to the ED if you develop new or worsening symptoms. Urinary tract infections (UTIs) can develop anywhere along your urinary tract. Your urinary tract is your body's drainage system for removing wastes and extra water. Your urinary tract includes two kidneys, two ureters, a bladder, and a urethra. Your kidneys are a pair of bean-shaped organs. Each kidney is about the size of your fist. They are located below your ribs, one on each side of your spine. CAUSES Infections are caused by microbes, which are microscopic organisms, including fungi, viruses, and bacteria. These organisms are so small that they can only be seen through a microscope. Bacteria are the microbes that most commonly cause UTIs. SYMPTOMS  Symptoms of UTIs may vary by age and gender of the patient and by the location of the infection. Symptoms in young women typically include a frequent and intense urge to urinate and a painful, burning feeling in the bladder or urethra during urination. Older women and men are more likely to be tired, shaky, and weak and have muscle aches and abdominal pain. A fever may mean the infection is in your kidneys. Other symptoms of a kidney infection include pain in your back or sides below the ribs, nausea, and vomiting. DIAGNOSIS To diagnose a UTI, your caregiver will ask you about your symptoms. Your caregiver also will ask to provide a urine sample. The urine sample will be tested for bacteria and white blood cells. White blood cells are made by your body to help fight infection. TREATMENT  Typically, UTIs can be treated with medication. Because most UTIs are caused by a bacterial infection, they usually can be treated with the use of antibiotics. The choice of antibiotic and length of treatment depend on your symptoms and the type of bacteria causing your infection. HOME CARE  INSTRUCTIONS  If you were prescribed antibiotics, take them exactly as your caregiver instructs you. Finish the medication even if you feel better after you have only taken some of the medication.  Drink enough water and fluids to keep your urine clear or pale yellow.  Avoid caffeine, tea, and carbonated beverages. They tend to irritate your bladder.  Empty your bladder often. Avoid holding urine for long periods of time.  Empty your bladder before and after sexual intercourse.  After a bowel movement, women should cleanse from front to back. Use each tissue only once. SEEK MEDICAL CARE IF:   You have back pain.  You develop a fever.  Your symptoms do not begin to resolve within 3 days. SEEK IMMEDIATE MEDICAL CARE IF:   You have severe back pain or lower abdominal pain.  You develop chills.  You have nausea or vomiting.  You have continued burning or discomfort with urination. MAKE SURE YOU:   Understand these instructions.  Will watch your condition.  Will get help right away if you are not doing well or get worse. Document Released: 10/19/2004 Document Revised: 07/11/2011 Document Reviewed: 02/17/2011 [facility] Patient Information 2015 [place], [place]. This information is not intended to replace advice given to you by your health care provider. Make sure you discuss any questions you have with your health care provider.

## 2014-09-14 NOTE — ED Provider Notes (Signed)
CSN: [id number]     Arrival date & time 09/14/14  1526 History   First MD Initiated Contact with Patient 09/14/14 1709     Chief Complaint  Patient presents with  . Back Pain     (Consider location/radiation/quality/duration/timing/severity/associated sxs/prior Treatment) HPI Comments: Patient reports 2 weeks of decreased urine output, low back pain and lower abdominal pain. Reports urinating frequently in small amounts and family she can't empty her bladder. She has been given increased fluids at her nursing home without improvement. Denies fever. Denies vomiting. Denies chest pain or shortness of breath. Denies previous problems with urination. Denies pain with urination or hematuria. Patient with history of diabetes, left-sided weakness from previous stroke, hypertension, DVT on Coumadin.  The history is provided by the patient.    Past Medical History  Diagnosis Date  . DVT, lower extremity, recurrent     Long-term Coumadin per Dr. Legrand Rams  . Type 2 diabetes mellitus   . Anxiety   . Depression     History of psychosis and previous suicide attempt  . Hemiplegia   . GERD (gastroesophageal reflux disease)   . History of stroke     Acute infarct and right cerebral white matter small vessel disease 12/10  . Arthritis   . Essential hypertension    Past Surgical History  Procedure Laterality Date  . Back surgery     History reviewed. No pertinent family history. Social History  Substance Use Topics  . Smoking status: Never Smoker   . Smokeless tobacco: None  . Alcohol Use: No   OB History    No data available     Review of Systems  Constitutional: Negative for fever, activity change and appetite change.  HENT: Negative for congestion and rhinorrhea.   Respiratory: Negative for cough, chest tightness and shortness of breath.   Cardiovascular: Negative for chest pain and leg swelling.  Gastrointestinal: Positive for abdominal pain. Negative for nausea and vomiting.   Genitourinary: Positive for decreased urine volume and difficulty urinating. Negative for dysuria, urgency, hematuria, flank pain, vaginal bleeding, vaginal discharge and pelvic pain.  Musculoskeletal: Positive for back pain. Negative for myalgias and arthralgias.  Skin: Positive for pallor.  Neurological: Negative for dizziness, weakness, light-headedness and headaches.  A complete 10 system review of systems was obtained and all systems are negative except as noted in the HPI and PMH.      Allergies  Sulfa antibiotics and Sulfonamide derivatives  Home Medications   Prior to Admission medications   Medication Sig Start Date End Date Taking? Authorizing Provider  HYDROcodone-acetaminophen (NORCO) 5-325 MG per tablet Take 1 tablet by mouth 3 (three) times daily.    Yes Historical Provider, MD  acetaminophen (TYLENOL) 325 MG tablet Take 650 mg by mouth every 6 (six) hours as needed. For fever > 101     Historical Provider, MD  albuterol (PROVENTIL) (2.5 MG/3ML) 0.083% nebulizer solution Take 2.5 mg by nebulization every 2 (two) hours as needed. For wheezing     Historical Provider, MD  ARIPiprazole (ABILIFY) 2 MG tablet Take 1 tablet (2 mg total) by mouth daily. 05/25/11 07/27/11  Nat Christen, MD  Carboxymethylcellul-Glycerin (REFRESH OPTIVE OP) Apply to eye.    Historical Provider, MD  clonazePAM (KLONOPIN) 0.5 MG tablet Take 2 tablets (1 mg total) by mouth 3 (three) times daily. 05/25/11 03/25/14  Nat Christen, MD  DULoxetine (CYMBALTA) 60 MG capsule Take 120 mg by mouth daily.     Historical Provider, MD  enalapril (VASOTEC) 5  MG tablet Take 5 mg by mouth every morning.     Historical Provider, MD  insulin aspart (NOVOLOG) 100 UNIT/ML injection Inject 1-6 Units into the skin 3 (three) times daily before meals. Addition to the normal 12 unit dose: If BG 150-200: add 1 unit, 201-250: add 2 units, 251-300: add 3 units, 301-350: add 4 units, 351-400: add 5 units, >400: add 6 units     Historical  Provider, MD  insulin glargine (LANTUS) 100 UNIT/ML injection Inject 90 Units into the skin at bedtime.     Historical Provider, MD  Insulin Syringe-Needle U-100 30G X 5/16" 0.5 ML MISC by Does not apply route.    Historical Provider, MD  ipratropium (ATROVENT) 0.02 % nebulizer solution Take 500 mcg by nebulization 4 (four) times daily as needed. Shortness of breath     Historical Provider, MD  loperamide (IMODIUM) 2 MG capsule Take 4 mg by mouth 4 (four) times daily as needed. Take 2 capsules initially then take 2 after each loose stool per [facility]     Historical Provider, MD  metFORMIN (GLUCOPHAGE) 500 MG tablet Take 500 mg by mouth 2 (two) times daily with a meal.      Historical Provider, MD  metoCLOPramide (REGLAN) 10 MG tablet Take 10 mg by mouth 3 (three) times daily before meals.     Historical Provider, MD  metoprolol succinate (TOPROL-XL) 25 MG 24 hr tablet Take 25 mg by mouth 2 (two) times daily.     Historical Provider, MD  mirtazapine (REMERON) 30 MG tablet Take 30 mg by mouth at bedtime.      Historical Provider, MD  omeprazole (PRILOSEC) 20 MG capsule Take 20 mg by mouth 2 (two) times daily. Take 1 po BID x 2 weeks then back down to once a day  11/05/10   Rolland Porter, MD  ondansetron (ZOFRAN-ODT) 4 MG disintegrating tablet Take 4 mg by mouth every 8 (eight) hours as needed. Nausea and Vomiting     Historical Provider, MD  polyethylene glycol powder (GLYCOLAX/MIRALAX) powder Take 17 g by mouth daily.      Historical Provider, MD  sitaGLIPtin (JANUVIA) 100 MG tablet Take 100 mg by mouth daily.  09/23/10   [name], MD  solifenacin (VESICARE) 5 MG tablet Take 5 mg by mouth daily.    Historical Provider, MD  tiZANidine (ZANAFLEX) 2 MG tablet Take 2 mg by mouth daily.    Historical Provider, MD  warfarin (COUMADIN) 2.5 MG tablet TAKE 1 TABLET BY MOUTH ON MONDAYS. TAKE ALONG WITH THE 5MG . 05/12/14   Satira Sark, MD  warfarin (COUMADIN) 5 MG tablet TAKE ONE TABLET BY MOUTH ONCE DAILY  EXCEPT ADD A 2.5MG  ON MONDAYS. 05/12/14   Satira Sark, MD  zolpidem (AMBIEN) 5 MG tablet Take 1 tablet (5 mg total) by mouth at bedtime as needed for sleep. Sleep 06/27/11   Lendon Colonel, NP   BP 128/62 mmHg  Pulse 73  Temp(Src) 97.8 F (36.6 C) (Oral)  Resp 18  Ht 5\' 6"  (1.676 m)  Wt 165 lb (74.844 kg)  BMI 26.64 kg/m2  SpO2 99% Physical Exam  Constitutional: She is oriented to person, place, and time. She appears well-developed and well-nourished. No distress.  Pale-appearing, dry mucous membranes  HENT:  Head: Normocephalic and atraumatic.  Mouth/Throat: No oropharyngeal exudate.  Eyes: Conjunctivae and EOM are normal. Pupils are equal, round, and reactive to light.  Neck: Normal range of motion. Neck supple.  No meningismus.  Cardiovascular:  Normal rate, regular rhythm, normal heart sounds and intact distal pulses.   No murmur heard. Pulmonary/Chest: Effort normal and breath sounds normal. No respiratory distress.  Abdominal: Soft. She exhibits distension. There is no tenderness. There is no rebound and no guarding.  Distended abdomen, no tender.  Bladder not palpable.  Musculoskeletal: Normal range of motion. She exhibits tenderness. She exhibits no edema.  Paraspinal lumbar tenderness  Neurological: She is alert and oriented to person, place, and time. No cranial nerve deficit. She exhibits normal muscle tone. Coordination normal.  L sided weakness at baseline.  Skin: Skin is warm.  Psychiatric: She has a normal mood and affect. Her behavior is normal.  Nursing note and vitals reviewed.   ED Course  Procedures (including critical care time) Labs Review Labs Reviewed  URINALYSIS, ROUTINE W REFLEX MICROSCOPIC (NOT AT [facility]) - Abnormal; Notable for the following:    Specific Gravity, Urine <1.005 (*)    Glucose, UA >1000 (*)    Leukocytes, UA MODERATE (*)    All other components within normal limits  CBC WITH DIFFERENTIAL/PLATELET - Abnormal; Notable for the  following:    Hemoglobin 9.1 (*)    HCT 32.0 (*)    MCV 69.7 (*)    MCH 19.8 (*)    MCHC 28.4 (*)    RDW 17.8 (*)    All other components within normal limits  COMPREHENSIVE METABOLIC PANEL - Abnormal; Notable for the following:    Chloride 99 (*)    Glucose, Bld 123 (*)    Calcium 8.3 (*)    ALT 11 (*)    Total Bilirubin 0.2 (*)    All other components within normal limits  LIPASE, BLOOD - Abnormal; Notable for the following:    Lipase 14 (*)    All other components within normal limits  PROTIME-INR - Abnormal; Notable for the following:    Prothrombin Time 27.8 (*)    INR 2.63 (*)    All other components within normal limits  URINE MICROSCOPIC-ADD ON - Abnormal; Notable for the following:    Bacteria, UA FEW (*)    All other components within normal limits  URINE CULTURE  POC OCCULT BLOOD, ED    Imaging Review Ct Renal Stone Study  09/14/2014   CLINICAL DATA:  Low back and LEFT flank pain with difficulty urinating for 2 weeks, history hypertension, type II diabetes mellitus  EXAM: CT ABDOMEN AND PELVIS WITHOUT CONTRAST  TECHNIQUE: Multidetector CT imaging of the abdomen and pelvis was performed following the standard protocol without IV contrast. Sagittal and coronal MPR images reconstructed from axial data set. Oral contrast not administered for this indication.  COMPARISON:  02/23/2010  FINDINGS: Calcified granuloma RIGHT lower lobe.  Minimal peribronchovascular infiltrate base of RIGHT middle lobe.  Mass again identified lateral aspect RIGHT lobe liver 3.3 x 2.3 x 3.5 cm, imaging characteristics consistent with hepatic hemangioma on prior exam.  Dependent gallstones in gallbladder.  Liver, spleen, pancreas, kidneys, and adrenal glands otherwise normal appearance.  Unremarkable bladder, ureters, uterus, and RIGHT adnexa.  Low-attenuation lesion LEFT ovary 4.1 x 4.5 x 3.8 cm, question complicated cyst with a small mural nodule 8 mm diameter.  Normal appendix.  Small hiatal hernia.   Stomach and bowel loops otherwise unremarkable for technique.  Large anterior pelvic wall collaterals again identified question old thrombosis of LEFT iliac vein.  Scattered atherosclerotic calcifications.  Subcutaneous infiltrative changes in the anterior RIGHT mid abdomen of uncertain etiology, could reflect trauma or infection.  Minimally enlarged celiac axis node 13 mm short axis image 22 previously 11 mm.  No additional mass, adenopathy, free air, free fluid, or hernia.  Chronic anterolisthesis L4-L5.  IMPRESSION: Cholelithiasis.  RIGHT lobe liver mass slightly increased since 2012, imaging characteristics on previous exam most consistent with hepatic hemangioma.  New complex cystic lesion within LEFT ovary containing an 8 mm mural nodule ; recommend followup ultrasound characterization.  Question prior thrombosis of LEFT external and common iliac veins with anterior pelvic wall collaterals.  Nonspecific subcutaneous infiltration with skin thickening in the RIGHT mid abdomen question infection versus prior trauma.  Small hiatal hernia.  No urinary tract calcification identified.   Electronically Signed   By: Lavonia Dana M.D.   On: 09/14/2014 18:18   I have personally reviewed and evaluated these images and lab results as part of my medical decision-making.   EKG Interpretation None      MDM   Final diagnoses:  Flank pain  Ovarian mass, left   difficulty with urination with low back pain and lower abdominal pain for the past 2 weeks. Left-sided weakness at baseline.  Bladder scan shows 230 mL.  Patient able to void 175 mL. We'll hold catheter at this time. IVF given.  INR 2.6. Hemoglobin is 9 which is decreased from 12 3 years ago. Hemoccult is negative. Brown stool. MCV low.   CT shows liver mass consistent with hemangioma. There is new cystic lesion left ovary. CT results discussed with Dr. Thornton Papas. He does not feel this needs emergent ultrasound follow-up. We'll schedule for tomorrow.  Patient clinically does not have ovarian torsion.  Urinalysis is positive for infection. Treated with Rocephin.  CT results discussed with patient. She is agreeable to return tomorrow for an ultrasound. Her pain has been constant for the past week. She appears comfortable. Doubt ovarian torsion. We'll treat for urinary tract infection.   Ezequiel Essex, MD 09/14/14 2249

## 2014-09-14 NOTE — ED Notes (Signed)
Awaiting transport from [facility].

## 2014-09-14 NOTE — ED Notes (Signed)
Refused to be ambulated. States she can not walk around nurses station.

## 2014-09-14 NOTE — ED Notes (Signed)
Bladder scan urine vol. 231 ml.

## 2014-09-14 NOTE — ED Notes (Signed)
Pt still refusing to walk, states she is ambulatory with a walker. Pt offered 2 person assist, still refuses.

## 2014-09-14 NOTE — ED Notes (Signed)
Patient reports low back pain and difficulty urinating x 2 weeks. Patient is from [place].

## 2014-09-15 ENCOUNTER — Other Ambulatory Visit (HOSPITAL_COMMUNITY): Payer: Self-pay | Admitting: Emergency Medicine

## 2014-09-15 ENCOUNTER — Ambulatory Visit (HOSPITAL_COMMUNITY)
Admission: RE | Admit: 2014-09-15 | Discharge: 2014-09-15 | Disposition: A | Discharge status: Home / Self Care | Payer: Medicare Other | Source: Ambulatory Visit | Attending: Emergency Medicine | Admitting: Emergency Medicine

## 2014-09-15 ENCOUNTER — Other Ambulatory Visit (HOSPITAL_COMMUNITY): Payer: Medicare Other

## 2014-09-15 DIAGNOSIS — N838 Other noninflammatory disorders of ovary, fallopian tube and broad ligament: Secondary | ICD-10-CM

## 2014-09-15 DIAGNOSIS — N8320 Unspecified ovarian cysts: Secondary | ICD-10-CM | POA: Diagnosis not present

## 2014-09-15 DIAGNOSIS — R102 Pelvic and perineal pain: Secondary | ICD-10-CM | POA: Diagnosis present

## 2014-09-16 LAB — URINE CULTURE: Culture: NO GROWTH

## 2014-09-23 DIAGNOSIS — Z23 Encounter for immunization: Secondary | ICD-10-CM | POA: Diagnosis not present

## 2014-09-23 DIAGNOSIS — M159 Polyosteoarthritis, unspecified: Secondary | ICD-10-CM | POA: Diagnosis not present

## 2014-09-23 DIAGNOSIS — I6350 Cerebral infarction due to unspecified occlusion or stenosis of unspecified cerebral artery: Secondary | ICD-10-CM | POA: Diagnosis not present

## 2014-09-23 DIAGNOSIS — I82549 Chronic embolism and thrombosis of unspecified tibial vein: Secondary | ICD-10-CM | POA: Diagnosis not present

## 2014-09-23 DIAGNOSIS — G4700 Insomnia, unspecified: Secondary | ICD-10-CM | POA: Diagnosis not present

## 2014-09-23 DIAGNOSIS — E1165 Type 2 diabetes mellitus with hyperglycemia: Secondary | ICD-10-CM | POA: Diagnosis not present

## 2014-09-23 DIAGNOSIS — I959 Hypotension, unspecified: Secondary | ICD-10-CM | POA: Diagnosis not present

## 2014-09-23 DIAGNOSIS — M5489 Other dorsalgia: Secondary | ICD-10-CM | POA: Diagnosis not present

## 2014-09-23 DIAGNOSIS — K8020 Calculus of gallbladder without cholecystitis without obstruction: Secondary | ICD-10-CM | POA: Diagnosis not present

## 2014-09-23 DIAGNOSIS — I825Y9 Chronic embolism and thrombosis of unspecified deep veins of unspecified proximal lower extremity: Secondary | ICD-10-CM | POA: Diagnosis not present

## 2014-09-23 DIAGNOSIS — D649 Anemia, unspecified: Secondary | ICD-10-CM | POA: Diagnosis not present

## 2014-09-23 DIAGNOSIS — I10 Essential (primary) hypertension: Secondary | ICD-10-CM | POA: Diagnosis not present

## 2014-10-02 ENCOUNTER — Encounter: Payer: Self-pay | Admitting: Gastroenterology

## 2014-10-05 ENCOUNTER — Ambulatory Visit (INDEPENDENT_AMBULATORY_CARE_PROVIDER_SITE_OTHER): Payer: Medicare Other | Admitting: *Deleted

## 2014-10-05 DIAGNOSIS — Z8679 Personal history of other diseases of the circulatory system: Secondary | ICD-10-CM | POA: Diagnosis not present

## 2014-10-05 DIAGNOSIS — Z5181 Encounter for therapeutic drug level monitoring: Secondary | ICD-10-CM | POA: Diagnosis not present

## 2014-10-05 DIAGNOSIS — Z7901 Long term (current) use of anticoagulants: Secondary | ICD-10-CM

## 2014-10-05 DIAGNOSIS — I82409 Acute embolism and thrombosis of unspecified deep veins of unspecified lower extremity: Secondary | ICD-10-CM | POA: Diagnosis not present

## 2014-10-05 LAB — POCT INR: INR: 2.7

## 2014-10-08 DIAGNOSIS — N393 Stress incontinence (female) (male): Secondary | ICD-10-CM | POA: Diagnosis not present

## 2014-10-21 ENCOUNTER — Telehealth: Payer: Self-pay

## 2014-10-21 ENCOUNTER — Encounter: Payer: Self-pay | Admitting: Gastroenterology

## 2014-10-21 ENCOUNTER — Ambulatory Visit (INDEPENDENT_AMBULATORY_CARE_PROVIDER_SITE_OTHER): Payer: Medicare Other | Admitting: Gastroenterology

## 2014-10-21 VITALS — BP 100/50 | HR 99 | Temp 97.8°F | Ht 67.0 in | Wt 172.2 lb

## 2014-10-21 DIAGNOSIS — D649 Anemia, unspecified: Secondary | ICD-10-CM

## 2014-10-21 DIAGNOSIS — K625 Hemorrhage of anus and rectum: Secondary | ICD-10-CM | POA: Insufficient documentation

## 2014-10-21 LAB — IRON AND TIBC
%SAT: 2 % — ABNORMAL LOW (ref 11–50)
Iron: 10 ug/dL — ABNORMAL LOW (ref 45–160)
TIBC: 418 ug/dL (ref 250–450)
UIBC: 408 ug/dL — ABNORMAL HIGH (ref 125–400)

## 2014-10-21 LAB — FERRITIN: Ferritin: 6 ng/mL — ABNORMAL LOW (ref 10–291)

## 2014-10-21 MED ORDER — LINACLOTIDE 290 MCG PO CAPS: 290.0000 ug | ORAL_CAPSULE | Freq: Every day | ORAL | Status: DC

## 2014-10-21 NOTE — Telephone Encounter (Signed)
[name], this pt was seen by Laban Emperor, NP this morning in the office. We would like to schedule a colonoscopy and EGD in the very near future. Please advise if it is OK to hold coumadin x 3 days prior to procedures and if Lovenox will be needed. Thanks so much!

## 2014-10-21 NOTE — Telephone Encounter (Signed)
Routing to [employer].

## 2014-10-21 NOTE — Telephone Encounter (Signed)
CHADS2 Score = 4.  Ok to hold coumadin but pt will need to be bridged with Lovenox.

## 2014-10-21 NOTE — Progress Notes (Signed)
Primary Care Physician:  [name], MD Primary Gastroenterologist:  Dr. Oneida Alar   Chief Complaint  Patient presents with  . Rectal Bleeding    HPI:   [name] is a 69 y.o. female presenting today at the request of Dr. Legrand Rams secondary to rectal bleeding.   Notes small amount of hematochezia with constipation. No melena. Intermittent constipation. Yogurt helps with constipation. Stool softeners, which don't help much. BM about twice a week. Associated abdominal discomfort. No N/V or dysphagia. Intermittent burping. Amitiza 24 mcg for constipation. On Coumadin with history of thromboembolism in past.   Good appetite. No unexplained weight loss. No prior colonoscopy. Last EGD in remote past. Problems with dysphagia at that time. Thinks it was in [place]. Resides in [place] since 2010 since stroke. Left-sided weakness. Ambulates with walker, wheelchair as needed.   Past Medical History  Diagnosis Date  . DVT, lower extremity, recurrent     Long-term Coumadin per Dr. Legrand Rams  . Type 2 diabetes mellitus   . Anxiety   . Depression     History of psychosis and previous suicide attempt  . Hemiplegia   . GERD (gastroesophageal reflux disease)   . History of stroke     Acute infarct and right cerebral white matter small vessel disease 12/10  . Arthritis   . Essential hypertension     Past Surgical History  Procedure Laterality Date  . Back surgery      Current Outpatient Prescriptions  Medication Sig Dispense Refill  . acetaminophen (TYLENOL) 325 MG tablet Take 650 mg by mouth every 6 (six) hours as needed. For fever > 101     . ARIPiprazole (ABILIFY) 2 MG tablet     . calcium carbonate (TUMS - DOSED IN MG ELEMENTAL CALCIUM) 500 MG chewable tablet Chew 1 tablet by mouth as needed for indigestion or heartburn.    . canagliflozin (INVOKANA) 100 MG TABS tablet Take 100 mg by mouth daily.    . cephALEXin (KEFLEX) 500 MG capsule Take 1 capsule (500 mg total) by mouth  4 (four) times daily. 40 capsule 0  . clonazePAM (KLONOPIN) 0.5 MG tablet 3 (three) times daily.     Marland Kitchen docusate sodium (COLACE) 100 MG capsule Take 100 mg by mouth at bedtime.    . DULoxetine (CYMBALTA) 60 MG capsule Take 120 mg by mouth daily.     . enalapril (VASOTEC) 5 MG tablet Take 2.5 mg by mouth every morning.     Marland Kitchen HYDROcodone-acetaminophen (NORCO) 5-325 MG per tablet Take 1 tablet by mouth 3 (three) times daily.     . insulin aspart (NOVOLOG) 100 UNIT/ML injection Inject 3-6 Units into the skin 3 (three) times daily before meals. 251-300: add 3 units, 301-350: add 4 units, 351-400: add 5 units, 400-450: add 6 units    . insulin aspart (NOVOLOG) 100 UNIT/ML injection Inject 35 Units into the skin 3 (three) times daily before meals. IF 90-150 GIVE 35 UNITS, IF 151-200, ADD 1 UNIT, IF 201-250, ADD 2 UNITS,    . insulin detemir (LEVEMIR) 100 UNIT/ML injection Inject 100 Units into the skin at bedtime.    Marland Kitchen loperamide (IMODIUM) 2 MG capsule Take 4 mg by mouth daily as needed for diarrhea or loose stools. Take 2 capsules initially then take 2 after each loose stool per MAR    . lubiprostone (AMITIZA) 24 MCG capsule Take 24 mcg by mouth 2 (two) times daily with a meal.    . metoprolol succinate (  TOPROL-XL) 25 MG 24 hr tablet Take 12.5 mg by mouth 2 (two) times daily.     . mirtazapine (REMERON) 30 MG tablet Take 30 mg by mouth at bedtime.      Marland Kitchen omeprazole (PRILOSEC) 40 MG capsule Take 40 mg by mouth daily.    . polyvinyl alcohol (ARTIFICIAL TEARS) 1.4 % ophthalmic solution Place 1 drop into both eyes 3 (three) times daily.    . risperiDONE (RISPERDAL) 1 MG tablet Take 1 mg by mouth at bedtime.    . simvastatin (ZOCOR) 20 MG tablet Take 20 mg by mouth at bedtime.    . sodium chloride (OCEAN) 0.65 % SOLN nasal spray Place 2 sprays into both nostrils 4 (four) times daily.    . solifenacin (VESICARE) 5 MG tablet Take 5 mg by mouth daily.    Marland Kitchen tiZANidine (ZANAFLEX) 2 MG tablet Take 2 mg by mouth  daily.    . Vitamin D, Ergocalciferol, (DRISDOL) 50000 UNITS CAPS capsule Take 50,000 Units by mouth every 7 (seven) days.    Marland Kitchen warfarin (COUMADIN) 5 MG tablet TAKE ONE TABLET BY MOUTH ONCE DAILY EXCEPT ADD A 2.5MG  ON MONDAYS. (Patient taking differently: TAKE ONE TABLET BY MOUTH ONCE DAILY) 30 tablet 6  . zolpidem (AMBIEN) 10 MG tablet Take 10 mg by mouth at bedtime.    . ARIPiprazole (ABILIFY) 2 MG tablet Take 1 tablet (2 mg total) by mouth daily. 30 tablet 0  . clonazePAM (KLONOPIN) 0.5 MG tablet Take 2 tablets (1 mg total) by mouth 3 (three) times daily. (Patient taking differently: Take 0.5 mg by mouth 3 (three) times daily. ) 90 tablet 0   No current facility-administered medications for this visit.    Allergies as of 10/21/2014 - Review Complete 10/21/2014  Allergen Reaction Noted  . Sulfa antibiotics Rash 02/23/2010  . Sulfonamide derivatives Rash     Family History  Problem Relation Age of Onset  . Colon cancer Neg Hx     Social History   Social History  . Marital Status: Widowed    Spouse Name: N/A  . Number of Children: N/A  . Years of Education: N/A   Occupational History  . Not on file.   Social History Main Topics  . Smoking status: Never Smoker   . Smokeless tobacco: Not on file  . Alcohol Use: No  . Drug Use: No  . Sexual Activity: No   Other Topics Concern  . Not on file   Social History Narrative    Review of Systems: Gen: Denies any fever, chills, fatigue, weight loss, lack of appetite.  CV: Denies chest pain, heart palpitations, peripheral edema, syncope.  Resp: occasional DOE GI: see HPI GU : recent UTI Aug 2016, now resolved  MS: left-sided weakness. +joint pain, arthritis  Derm: Denies rash, itching, dry skin Psych: Denies depression, anxiety, memory loss, and confusion Heme: see HPI.  Physical Exam: BP 85/49 mmHg  Pulse 99  Temp(Src) 97.8 F (36.6 C)  Ht 5\' 7"  (1.702 m)  Wt 172 lb 3.2 oz (78.109 kg)  BMI 26.96 kg/m2 General:    Alert and oriented. Pleasant and cooperative. Well-nourished and well-developed.  Head:  Normocephalic and atraumatic. Eyes:  Without icterus, sclera clear and conjunctiva pink.  Ears:  Normal auditory acuity. Nose:  No deformity, discharge,  or lesions. Mouth:  No deformity or lesions, oral mucosa pink.  Lungs:  Clear to auscultation bilaterally. No wheezes, rales, or rhonchi. No distress.  Heart:  S1, S2 present without murmurs  appreciated.  Abdomen:  +BS, soft, non-tender and non-distended. No HSM noted. No guarding or rebound. No masses appreciated.  Rectal:  Deferred  Msk:  Residual left-sided weakness greater than right s/p stroke, uses cane for ambulation  Extremities:  Without clubbing or edema. Neurologic:  Alert and  oriented x4 Skin:  Intact without significant lesions or rashes. Psych:  Alert and cooperative. Normal mood and affect.   Outside labs Sept 1, 2016 :  Hgb 9, Hct 31.7. MCV 70.8. RDW high at 20.

## 2014-10-21 NOTE — Patient Instructions (Signed)
Stop Amitiza. Start taking Linzess 1 capsule each morning, 30 minutes before breakfast. I provided samples and sent to your pharmacy.  Please have blood work done.   I would like for you to have a colonoscopy and likely upper endoscopy but we need to see how to proceed with the Coumadin dosing. Further recommendations to follow.

## 2014-10-22 LAB — CBC
HCT: 28.7 % — ABNORMAL LOW (ref 36.0–46.0)
Hemoglobin: 8.6 g/dL — ABNORMAL LOW (ref 12.0–15.0)
MCH: 19.4 pg — ABNORMAL LOW (ref 26.0–34.0)
MCHC: 30 g/dL (ref 30.0–36.0)
MCV: 64.6 fL — ABNORMAL LOW (ref 78.0–100.0)
MPV: 10 fL (ref 8.6–12.4)
Platelets: 389 10*3/uL (ref 150–400)
RBC: 4.44 MIL/uL (ref 3.87–5.11)
RDW: 18.1 % — ABNORMAL HIGH (ref 11.5–15.5)
WBC: 9.4 10*3/uL (ref 4.0–10.5)

## 2014-10-23 DIAGNOSIS — E1165 Type 2 diabetes mellitus with hyperglycemia: Secondary | ICD-10-CM | POA: Diagnosis not present

## 2014-10-23 DIAGNOSIS — M159 Polyosteoarthritis, unspecified: Secondary | ICD-10-CM | POA: Diagnosis not present

## 2014-10-23 NOTE — Telephone Encounter (Signed)
Waiting on bridge to get her scheduled

## 2014-10-26 NOTE — Telephone Encounter (Signed)
Ok. Please put her on the schedule for TCS/EGD with Dr. Oneida Alar with PROPOFOL and let me know the date so I can give Coumadin and Lovenox instructions.

## 2014-10-26 NOTE — Telephone Encounter (Signed)
We have her scheduled for Nov.1

## 2014-10-26 NOTE — Progress Notes (Signed)
cc'ed to pcp °

## 2014-10-26 NOTE — Assessment & Plan Note (Signed)
Check iron studies due to microcytic anemia. Unknown baseline. EGD possibly at time of colonoscopy if evidence of IDA.

## 2014-10-26 NOTE — Progress Notes (Signed)
Quick Note:  IDA noted with iron 10, ferritin 6. Hgb 8.6. Proceed with TCS/EGD as planned. ______

## 2014-10-26 NOTE — Telephone Encounter (Signed)
[name], did you see this note from [place]!

## 2014-10-26 NOTE — Assessment & Plan Note (Addendum)
69 year old female with low-volume hematochezia in the setting of constipation, complicated by chronic Coumadin for history of DVT. No prior colonoscopy. Anemia noted with Hgb 9, microcytic. Unknown baseline. Likely benign anorectal source but unable to exclude occult malignancy.   Proceed with colonoscopy with Dr. Oneida Alar in the near future. The risks, benefits, and alternatives have been discussed in detail with the patient. They state understanding and desire to proceed.  PROPOFOL due to polypharmacy HOLD COUMADIN X 3 DAYS PRIOR. NEEDS LOVENOX BRIDGE per CARDIOLOGY. WILL give 1mg /kg BID as bridge, holding evening dose of Lovenox prior to procedure. See detailed phone note instructions.  Start Linzess 290 mcg daily. Stop Amitiza.

## 2014-10-27 NOTE — Progress Notes (Signed)
Quick Note:  Tried to call. Butch Penny or [place] not in. ______

## 2014-10-28 NOTE — Progress Notes (Signed)
Quick Note:  [name] is aware. ______

## 2014-10-29 DIAGNOSIS — I10 Essential (primary) hypertension: Secondary | ICD-10-CM | POA: Diagnosis not present

## 2014-10-29 DIAGNOSIS — E1159 Type 2 diabetes mellitus with other circulatory complications: Secondary | ICD-10-CM | POA: Diagnosis not present

## 2014-10-29 DIAGNOSIS — E782 Mixed hyperlipidemia: Secondary | ICD-10-CM | POA: Diagnosis not present

## 2014-10-29 LAB — HEMOGLOBIN A1C: Hgb A1c MFr Bld: 7.9 % — AB (ref 4.0–6.0)

## 2014-10-30 NOTE — Telephone Encounter (Signed)
I will address this on Monday. Thanks!

## 2014-10-30 NOTE — Telephone Encounter (Signed)
Routing to Ginger who scheduled pt.

## 2014-11-02 ENCOUNTER — Ambulatory Visit (INDEPENDENT_AMBULATORY_CARE_PROVIDER_SITE_OTHER): Payer: Medicare Other | Admitting: *Deleted

## 2014-11-02 DIAGNOSIS — Z5181 Encounter for therapeutic drug level monitoring: Secondary | ICD-10-CM | POA: Diagnosis not present

## 2014-11-02 DIAGNOSIS — Z7901 Long term (current) use of anticoagulants: Secondary | ICD-10-CM | POA: Diagnosis not present

## 2014-11-02 DIAGNOSIS — I82409 Acute embolism and thrombosis of unspecified deep veins of unspecified lower extremity: Secondary | ICD-10-CM | POA: Diagnosis not present

## 2014-11-02 DIAGNOSIS — Z8679 Personal history of other diseases of the circulatory system: Secondary | ICD-10-CM | POA: Diagnosis not present

## 2014-11-02 LAB — POCT INR: INR: 3

## 2014-11-03 DIAGNOSIS — B351 Tinea unguium: Secondary | ICD-10-CM | POA: Diagnosis not present

## 2014-11-03 DIAGNOSIS — M79675 Pain in left toe(s): Secondary | ICD-10-CM | POA: Diagnosis not present

## 2014-11-03 DIAGNOSIS — M79674 Pain in right toe(s): Secondary | ICD-10-CM | POA: Diagnosis not present

## 2014-11-04 MED ORDER — ENOXAPARIN SODIUM 80 MG/0.8ML ~~LOC~~ SOLN: 1.0000 mg/kg | Freq: Two times a day (BID) | SUBCUTANEOUS | Status: DC

## 2014-11-04 NOTE — Addendum Note (Signed)
Addended by: Orvil Feil on: 11/04/2014 03:50 PM   Modules accepted: Orders

## 2014-11-04 NOTE — Telephone Encounter (Signed)
COUMADIN AND LOVENOX INSTRUCTIONS: 1. Last dose of Coumadin on Oct 28th. 2. Oct 29: Lovenox BID     Oct 30: Lovenox BID     Oct 31: Lovenox in the morning.      Nov 1: procedure.  Please have appt with Edrick Oh shortly thereafter for Coumadin instructions.

## 2014-11-05 ENCOUNTER — Other Ambulatory Visit: Payer: Self-pay

## 2014-11-05 ENCOUNTER — Telehealth: Payer: Self-pay

## 2014-11-05 MED ORDER — NA SULFATE-K SULFATE-MG SULF 17.5-3.13-1.6 GM/177ML PO SOLN: 1.0000 | ORAL | Status: DC

## 2014-11-05 NOTE — Telephone Encounter (Signed)
[name], this pt will have colonoscopy on 11/24/2014. She will be on Lovenox. Please give [place] a time that the pt should come by there and have her PT/INR after procedure. She is resident at [facility].

## 2014-11-05 NOTE — Telephone Encounter (Signed)
Follow up INR appt 11/30/14 @ 10:30am Thanks

## 2014-11-05 NOTE — Telephone Encounter (Signed)
Orders are in and instructions are in the mail 

## 2014-11-05 NOTE — Telephone Encounter (Signed)
I spoke to Welda at [facility] and informed her and will fax the info also to her at [phone]. She will call Edrick Oh @ [phone] and make the appt and let me know.

## 2014-11-05 NOTE — Telephone Encounter (Signed)
Routing to [facility].

## 2014-11-05 NOTE — Telephone Encounter (Signed)
Baker Janus is aware at [facility] and a copy of all of this info has been faxed to them.

## 2014-11-05 NOTE — Telephone Encounter (Signed)
Per [name] appt 11/30/2014 at 10:30 for INR.

## 2014-11-05 NOTE — Telephone Encounter (Signed)
I called and informed Baker Janus at [facility]. Stacy had stepped out. I told Baker Janus I will fax over the appt for the INR check for pt following her procedure. Please call if they have questions.

## 2014-11-05 NOTE — Telephone Encounter (Signed)
I have sent a message to Edrick Oh in epic to advise when to check PT/INR after procedure.

## 2014-11-06 ENCOUNTER — Encounter: Payer: Self-pay | Admitting: "Endocrinology

## 2014-11-06 ENCOUNTER — Ambulatory Visit (INDEPENDENT_AMBULATORY_CARE_PROVIDER_SITE_OTHER): Payer: Medicare Other | Admitting: "Endocrinology

## 2014-11-06 VITALS — BP 93/49 | HR 98 | Ht 62.0 in | Wt 174.0 lb

## 2014-11-06 DIAGNOSIS — I10 Essential (primary) hypertension: Secondary | ICD-10-CM | POA: Diagnosis not present

## 2014-11-06 DIAGNOSIS — E782 Mixed hyperlipidemia: Secondary | ICD-10-CM | POA: Insufficient documentation

## 2014-11-06 DIAGNOSIS — E785 Hyperlipidemia, unspecified: Secondary | ICD-10-CM | POA: Diagnosis not present

## 2014-11-06 DIAGNOSIS — E6609 Other obesity due to excess calories: Secondary | ICD-10-CM

## 2014-11-06 DIAGNOSIS — Z9189 Other specified personal risk factors, not elsewhere classified: Secondary | ICD-10-CM | POA: Diagnosis not present

## 2014-11-06 DIAGNOSIS — E1159 Type 2 diabetes mellitus with other circulatory complications: Secondary | ICD-10-CM | POA: Diagnosis not present

## 2014-11-06 NOTE — Progress Notes (Signed)
Subjective:    Patient ID: [name], female    DOB: Apr 26, 1945,    Past Medical History  Diagnosis Date  . DVT, lower extremity, recurrent (HCC)     Long-term Coumadin per Dr. Legrand Rams  . Type 2 diabetes mellitus ([place])   . Anxiety   . Depression     History of psychosis and previous suicide attempt  . Hemiplegia ([place])   . GERD (gastroesophageal reflux disease)   . History of stroke     Acute infarct and right cerebral white matter small vessel disease 12/10  . Arthritis   . Essential hypertension    Past Surgical History  Procedure Laterality Date  . Back surgery     Social History   Social History  . Marital Status: Widowed    Spouse Name: N/A  . Number of Children: N/A  . Years of Education: N/A   Social History Main Topics  . Smoking status: Never Smoker   . Smokeless tobacco: None  . Alcohol Use: No  . Drug Use: No  . Sexual Activity: No   Other Topics Concern  . None   Social History Narrative   Outpatient Encounter Prescriptions as of 11/06/2014  Medication Sig  . acetaminophen (TYLENOL) 325 MG tablet Take 650 mg by mouth every 6 (six) hours as needed. For fever > 101   . ARIPiprazole (ABILIFY) 2 MG tablet   . calcium carbonate (TUMS - DOSED IN MG ELEMENTAL CALCIUM) 500 MG chewable tablet Chew 1 tablet by mouth as needed for indigestion or heartburn.  . canagliflozin (INVOKANA) 100 MG TABS tablet Take 100 mg by mouth daily.  . cephALEXin (KEFLEX) 500 MG capsule Take 1 capsule (500 mg total) by mouth 4 (four) times daily.  . clonazePAM (KLONOPIN) 0.5 MG tablet 3 (three) times daily.   Marland Kitchen docusate sodium (COLACE) 100 MG capsule Take 100 mg by mouth at bedtime.  . DULoxetine (CYMBALTA) 60 MG capsule Take 120 mg by mouth daily.   . enalapril (VASOTEC) 5 MG tablet Take 2.5 mg by mouth every morning.   . enoxaparin (LOVENOX) 80 MG/0.8ML injection Inject 0.8 mLs (80 mg total) into the skin every 12 (twelve) hours. Starting October 29th, last dose the  morning of October 31st.  . HYDROcodone-acetaminophen (NORCO) 5-325 MG per tablet Take 1 tablet by mouth 3 (three) times daily.   . insulin aspart (NOVOLOG) 100 UNIT/ML injection Inject 3-6 Units into the skin 3 (three) times daily before meals. 251-300: add 3 units, 301-350: add 4 units, 351-400: add 5 units, 400-450: add 6 units  . insulin aspart (NOVOLOG) 100 UNIT/ML injection Inject 35 Units into the skin 3 (three) times daily before meals. IF 90-150 GIVE 35 UNITS, IF 151-200, ADD 1 UNIT, IF 201-250, ADD 2 UNITS,  . insulin detemir (LEVEMIR) 100 UNIT/ML injection Inject 100 Units into the skin at bedtime.  . Linaclotide (LINZESS) 290 MCG CAPS capsule Take 1 capsule (290 mcg total) by mouth daily. On an empty stomach, 30 minutes before breakfast  . loperamide (IMODIUM) 2 MG capsule Take 4 mg by mouth daily as needed for diarrhea or loose stools. Take 2 capsules initially then take 2 after each loose stool per MAR  . lubiprostone (AMITIZA) 24 MCG capsule Take 24 mcg by mouth 2 (two) times daily with a meal.  . metoprolol succinate (TOPROL-XL) 25 MG 24 hr tablet Take 12.5 mg by mouth 2 (two) times daily.   . mirtazapine (REMERON) 30 MG tablet Take 30  mg by mouth at bedtime.    . Na Sulfate-K Sulfate-Mg Sulf (SUPREP BOWEL PREP) SOLN Take 1 kit by mouth as directed.  Marland Kitchen omeprazole (PRILOSEC) 40 MG capsule Take 40 mg by mouth daily.  . polyvinyl alcohol (ARTIFICIAL TEARS) 1.4 % ophthalmic solution Place 1 drop into both eyes 3 (three) times daily.  . risperiDONE (RISPERDAL) 1 MG tablet Take 1 mg by mouth at bedtime.  . simvastatin (ZOCOR) 20 MG tablet Take 20 mg by mouth at bedtime.  . sodium chloride (OCEAN) 0.65 % SOLN nasal spray Place 2 sprays into both nostrils 4 (four) times daily.  . solifenacin (VESICARE) 5 MG tablet Take 5 mg by mouth daily.  Marland Kitchen tiZANidine (ZANAFLEX) 2 MG tablet Take 2 mg by mouth daily.  . Vitamin D, Ergocalciferol, (DRISDOL) 50000 UNITS CAPS capsule Take 50,000 Units by  mouth every 7 (seven) days.  Marland Kitchen warfarin (COUMADIN) 5 MG tablet TAKE ONE TABLET BY MOUTH ONCE DAILY EXCEPT ADD A 2.5MG ON MONDAYS. (Patient taking differently: TAKE ONE TABLET BY MOUTH ONCE DAILY)  . zolpidem (AMBIEN) 10 MG tablet Take 10 mg by mouth at bedtime.  . ARIPiprazole (ABILIFY) 2 MG tablet Take 1 tablet (2 mg total) by mouth daily.  . clonazePAM (KLONOPIN) 0.5 MG tablet Take 2 tablets (1 mg total) by mouth 3 (three) times daily. (Patient taking differently: Take 0.5 mg by mouth 3 (three) times daily. )   No facility-administered encounter medications on file as of 11/06/2014.   ALLERGIES: Allergies  Allergen Reactions  . Sulfa Antibiotics Rash  . Sulfonamide Derivatives Rash    REACTION: rash   VACCINATION STATUS:  There is no immunization history on file for this patient.  Diabetes She presents for her follow-up diabetic visit. She has type 2 diabetes mellitus. Onset time: She was diagnosed at approximate age of 5 years. There are no hypoglycemic associated symptoms. Pertinent negatives for hypoglycemia include no confusion, pallor or seizures. Pertinent negatives for diabetes include no polydipsia, no polyphagia and no polyuria. There are no hypoglycemic complications. Symptoms are improving. Diabetic complications include a CVA. Risk factors for coronary artery disease include dyslipidemia, diabetes mellitus, obesity, sedentary lifestyle and hypertension. Current diabetic treatment includes intensive insulin program and oral agent (monotherapy). She is following a generally unhealthy diet. She never participates in exercise. Her home blood glucose trend is decreasing steadily. Her overall blood glucose range is 140-180 mg/dl. An ACE inhibitor/angiotensin II receptor blocker is being taken.  Hypertension This is a chronic problem. The current episode started more than 1 year ago. The problem is controlled. Pertinent negatives include no palpitations or shortness of breath. Risk  factors for coronary artery disease include dyslipidemia and diabetes mellitus. Hypertensive end-organ damage includes CVA.  Hyperlipidemia This is a chronic problem. The current episode started more than 1 year ago. Exacerbating diseases include diabetes. Pertinent negatives include no shortness of breath. Risk factors for coronary artery disease include diabetes mellitus, dyslipidemia, hypertension, obesity and a sedentary lifestyle.     Review of Systems  Constitutional: Negative for unexpected weight change.       Uses walker to get around.   HENT: Negative for trouble swallowing and voice change.   Eyes: Negative for visual disturbance.  Respiratory: Negative for shortness of breath and wheezing.   Cardiovascular: Negative for palpitations and leg swelling.  Gastrointestinal: Negative for diarrhea.  Endocrine: Negative for cold intolerance, heat intolerance, polydipsia, polyphagia and polyuria.  Skin: Negative for color change, pallor and wound.  Neurological: Negative for  seizures.  Psychiatric/Behavioral: Negative for suicidal ideas and confusion.    Objective:    BP 93/49 mmHg  Pulse 98  Ht '5\' 2"'  (1.575 m)  Wt 174 lb (78.926 kg)  BMI 31.82 kg/m2  SpO2 98%  Wt Readings from Last 3 Encounters:  11/06/14 174 lb (78.926 kg)  10/21/14 172 lb 3.2 oz (78.109 kg)  09/14/14 165 lb (74.844 kg)    Physical Exam  Constitutional: She is oriented to person, place, and time. She appears well-developed.  HENT:  Head: Normocephalic and atraumatic.  Eyes: EOM are normal.  Neck: Normal range of motion. Neck supple. No tracheal deviation present. No thyromegaly present.  Cardiovascular: Normal rate and regular rhythm.   Pulmonary/Chest: Effort normal and breath sounds normal.  Abdominal: Soft. Bowel sounds are normal. There is no tenderness. There is no guarding.  Musculoskeletal: Normal range of motion. She exhibits no edema.  Neurological: She is alert and oriented to person, place,  and time. She has normal reflexes.  She uses walker to get around.  Skin: Skin is warm and dry. No rash noted. No erythema. No pallor.  Psychiatric: She has a normal mood and affect. Judgment normal.    Results for orders placed or performed in visit on 11/06/14  Hemoglobin A1c  Result Value Ref Range   Hgb A1c MFr Bld 7.9 (A) 4.0 - 6.0 %   Complete Blood Count (Most recent): Lab Results  Component Value Date   WBC 9.4 10/21/2014   HGB 8.6* 10/21/2014   HCT 28.7* 10/21/2014   MCV 64.6* 10/21/2014   PLT 389 10/21/2014   Chemistry (most recent): Lab Results  Component Value Date   NA 135 09/14/2014   K 3.6 09/14/2014   CL 99* 09/14/2014   CO2 27 09/14/2014   BUN 12 09/14/2014   CREATININE 0.63 09/14/2014   Diabetic Labs (most recent): Lab Results  Component Value Date   HGBA1C 7.9* 10/29/2014   Lipid profile (most recent): Lab Results  Component Value Date   TRIG 161* 01/03/2009   CHOL * 01/03/2009    210        ATP III CLASSIFICATION:  <200     mg/dL   Desirable  200-239  mg/dL   Borderline High  >=240    mg/dL   High                Assessment & Plan:   1. Type 2 diabetes mellitus with vascular disease ([place]) Her diabetes is  complicated by recurrent CVA. Patient came with without her glucose profile, however  recent A1c of improved to 7.9 %.  Recent labs reviewed. - Patient remains at a high risk for more acute and chronic complications of diabetes which include CAD, CVA, CKD, retinopathy, and neuropathy. These are all discussed in detail with the patient.  - I have re-counseled the patient on diet management and weight loss  by adopting a carbohydrate restricted / protein rich  Diet. - Patient is advised to stick to a routine mealtimes to eat 3 meals  a day and avoid unnecessary snacks ( to snack only to correct hypoglycemia).  - Suggestion is made for patient to avoid simple carbohydrates   from their diet including Cakes , Desserts, Ice Cream,  Soda (   diet and regular) , Sweet Tea , Candies,  Chips, Cookies, Artificial Sweeteners,   and "Sugar-free" Products .  This will help patient to have stable blood glucose profile and potentially avoid unintended  Weight gain.   -  I have approached patient with the following individualized plan to manage diabetes and patient agrees.  - For now , continue Levemir 100 units qhs, continue Novolog 35 units TIDAC for premeal BG readings of 90-120m/dl, plus patient specific sliding scale insulin for correction of unexpected hyperglycemia above 1569mdl, associated with strict monitoring of BG AC and HS.   -Adjustment parameters for hypo and hyperglycemia were given in a written document to patient. -Patient is encouraged to call clinic for blood glucose levels less than 70 or above 300 mg /dl.  -I will continue Invokana 10072mo qday, she did not tolerate Metformin.  - Patient will be considered for incretin therapy as appropriate next visit. - Patient specific target  for A1c; LDL, HDL, Triglycerides, and  Waist Circumference were discussed in detail.  2) BP/HTN: Controlled. Continue current medications including ACEI. 3) Lipids/HPL: continue statins. 4)  Weight/Diet:  exercise, and carbohydrates information provided.  5) Chronic Care/Health Maintenance:  -Patient is  on ACEI and Statin medications and encouraged to continue to follow up with Ophthalmology, Podiatrist at least yearly or according to recommendations, and advised to  stay away from smoking. I have recommended yearly flu vaccine and pneumonia vaccination at least every 5 years; and  sleep for at least 7 hours a day.  I advised patient to maintain close follow up with their PCP for primary care needs.  Patient is asked to bring meter and  blood glucose logs during their next visit.   Follow up plan: Return in about 3 months (around 02/06/2015) for diabetes, high blood pressure, high cholesterol, follow up with pre-visit labs, meter, and  logs.  GebGlade LloydD Phone: [phone]: [phone]/14/2016, 2:51 PM

## 2014-11-06 NOTE — Patient Instructions (Signed)

## 2014-11-12 DIAGNOSIS — Z794 Long term (current) use of insulin: Secondary | ICD-10-CM | POA: Diagnosis not present

## 2014-11-12 DIAGNOSIS — E119 Type 2 diabetes mellitus without complications: Secondary | ICD-10-CM | POA: Diagnosis not present

## 2014-11-18 ENCOUNTER — Other Ambulatory Visit: Payer: Self-pay | Admitting: "Endocrinology

## 2014-11-18 NOTE — Patient Instructions (Signed)
[name]  11/18/2014     @PREFPERIOPPHARMACY @   Your procedure is scheduled on  11/24/2014   Report to [facility] at  830  A.M.  Call this number if you have problems the morning of surgery:  [phone]   Remember:  Do not eat food or drink liquids after midnight.  Take these medicines the morning of surgery with A SIP OF WATER  Abilify, klonopin,cymbalta, vasotec, hydrocodone, metoprolol, prilosec. Take 1/2 of your usual insulin the night before your surgery. DO NOT take any medicine for your diabetes the morning of your surgery.   Do not wear jewelry, make-up or nail polish.  Do not wear lotions, powders, or perfumes.  You may wear deodorant.  Do not shave 48 hours prior to surgery.  Men may shave face and neck.  Do not bring valuables to the hospital.  [facility] is not responsible for any belongings or valuables.  Contacts, dentures or bridgework may not be worn into surgery.  Leave your suitcase in the car.  After surgery it may be brought to your room.  For patients admitted to the hospital, discharge time will be determined by your treatment team.  Patients discharged the day of surgery will not be allowed to drive home.   Name and phone number of your driver:   family Special instructions:  Follow the diet and prep instructions given to you by Dr Nona Dell office.  Please read over the following fact sheets that you were given. Pain Booklet, Coughing and Deep Breathing, Surgical Site Infection Prevention, Anesthesia Post-op Instructions and Care and Recovery After Surgery      Esophagogastroduodenoscopy Esophagogastroduodenoscopy (EGD) is a procedure that is used to examine the lining of the esophagus, stomach, and first part of the small intestine (duodenum). A long, flexible, lighted tube with a camera attached (endoscope) is inserted down the throat to view these organs. This procedure is done to detect problems or abnormalities, such as  inflammation, bleeding, ulcers, or growths, in order to treat them. The procedure lasts 5-20 minutes. It is usually an outpatient procedure, but it may need to be performed in a hospital in emergency cases. LET [facility] CARE PROVIDER KNOW ABOUT:  Any allergies you have.  All medicines you are taking, including vitamins, herbs, eye drops, creams, and over-the-counter medicines.  Previous problems you or members of your family have had with the use of anesthetics.  Any blood disorders you have.  Previous surgeries you have had.  Medical conditions you have. RISKS AND COMPLICATIONS Generally, this is a safe procedure. However, problems can occur and include:  Infection.  Bleeding.  Tearing (perforation) of the esophagus, stomach, or duodenum.  Difficulty breathing or not being able to breathe.  Excessive sweating.  Spasms of the larynx.  Slowed heartbeat.  Low blood pressure. BEFORE THE PROCEDURE  Do not eat or drink anything after midnight on the night before the procedure or as directed by your health care provider.  Do not take your regular medicines before the procedure if your health care provider asks you not to. Ask your health care provider about changing or stopping those medicines.  If you wear dentures, be prepared to remove them before the procedure.  Arrange for someone to drive you home after the procedure. PROCEDURE  A numbing medicine (local anesthetic) may be sprayed in your throat for comfort and to stop you from gagging or coughing.  You will have an IV tube  inserted in a vein in your hand or arm. You will receive medicines and fluids through this tube.  You will be given a medicine to relax you (sedative).  A pain reliever will be given through the IV tube.  A mouth guard may be placed in your mouth to protect your teeth and to keep you from biting on the endoscope.  You will be asked to lie on your left side.  The endoscope will be inserted  down your throat and into your esophagus, stomach, and duodenum.  Air will be put through the endoscope to allow your health care provider to clearly view the lining of your esophagus.  The lining of your esophagus, stomach, and duodenum will be examined. During the exam, your health care provider may:  Remove tissue to be examined under a microscope (biopsy) for inflammation, infection, or other medical problems.  Remove growths.  Remove objects (foreign bodies) that are stuck.  Treat any bleeding with medicines or other devices that stop tissues from bleeding (hot cautery, clipping devices).  Widen (dilate) or stretch narrowed areas of your esophagus and stomach.  The endoscope will be withdrawn. AFTER THE PROCEDURE  You will be taken to a recovery area for observation. Your blood pressure, heart rate, breathing rate, and blood oxygen level will be monitored often until the medicines you were given have worn off.  Do not eat or drink anything until the numbing medicine has worn off and your gag reflex has returned. You may choke.  Your health care provider should be able to discuss his or her findings with you. It will take longer to discuss the test results if any biopsies were taken.   This information is not intended to replace advice given to you by your health care provider. Make sure you discuss any questions you have with your health care provider.   Document Released: 05/12/2004 Document Revised: 01/30/2014 Document Reviewed: 12/13/2011 Elsevier Interactive Patient Education 2016 [employer]. Colonoscopy A colonoscopy is an exam to look at the entire large intestine (colon). This exam can help find problems such as tumors, polyps, inflammation, and areas of bleeding. The exam takes about 1 hour.  LET [facility] CARE PROVIDER KNOW ABOUT:   Any allergies you have.  All medicines you are taking, including vitamins, herbs, eye drops, creams, and over-the-counter  medicines.  Previous problems you or members of your family have had with the use of anesthetics.  Any blood disorders you have.  Previous surgeries you have had.  Medical conditions you have. RISKS AND COMPLICATIONS  Generally, this is a safe procedure. However, as with any procedure, complications can occur. Possible complications include:  Bleeding.  Tearing or rupture of the colon wall.  Reaction to medicines given during the exam.  Infection (rare). BEFORE THE PROCEDURE   Ask your health care provider about changing or stopping your regular medicines.  You may be prescribed an oral bowel prep. This involves drinking a large amount of medicated liquid, starting the day before your procedure. The liquid will cause you to have multiple loose stools until your stool is almost clear or light green. This cleans out your colon in preparation for the procedure.  Do not eat or drink anything else once you have started the bowel prep, unless your health care provider tells you it is safe to do so.  Arrange for someone to drive you home after the procedure. PROCEDURE   You will be given medicine to help you relax (sedative).  You will lie on your side with your knees bent.  A long, flexible tube with a light and camera on the end (colonoscope) will be inserted through the rectum and into the colon. The camera sends video back to a computer screen as it moves through the colon. The colonoscope also releases carbon dioxide gas to inflate the colon. This helps your health care provider see the area better.  During the exam, your health care provider may take a small tissue sample (biopsy) to be examined under a microscope if any abnormalities are found.  The exam is finished when the entire colon has been viewed. AFTER THE PROCEDURE   Do not drive for 24 hours after the exam.  You may have a small amount of blood in your stool.  You may pass moderate amounts of gas and have mild  abdominal cramping or bloating. This is caused by the gas used to inflate your colon during the exam.  Ask when your test results will be ready and how you will get your results. Make sure you get your test results.   This information is not intended to replace advice given to you by your health care provider. Make sure you discuss any questions you have with your health care provider.   Document Released: 01/07/2000 Document Revised: 10/30/2012 Document Reviewed: 09/16/2012 Elsevier Interactive Patient Education 2016 [employer] PATIENT INSTRUCTIONS POST-ANESTHESIA  IMMEDIATELY FOLLOWING SURGERY:  Do not drive or operate machinery for the first twenty four hours after surgery.  Do not make any important decisions for twenty four hours after surgery or while taking narcotic pain medications or sedatives.  If you develop intractable nausea and vomiting or a severe headache please notify your doctor immediately.  FOLLOW-UP:  Please make an appointment with your surgeon as instructed. You do not need to follow up with anesthesia unless specifically instructed to do so.  WOUND CARE INSTRUCTIONS (if applicable):  Keep a dry clean dressing on the anesthesia/puncture wound site if there is drainage.  Once the wound has quit draining you may leave it open to air.  Generally you should leave the bandage intact for twenty four hours unless there is drainage.  If the epidural site drains for more than 36-48 hours please call the anesthesia department.  QUESTIONS?:  Please feel free to call your physician or the hospital operator if you have any questions, and they will be happy to assist you.

## 2014-11-19 ENCOUNTER — Encounter (HOSPITAL_COMMUNITY): Payer: Self-pay

## 2014-11-19 ENCOUNTER — Other Ambulatory Visit (HOSPITAL_COMMUNITY): Payer: Medicare Other

## 2014-11-19 ENCOUNTER — Encounter (HOSPITAL_COMMUNITY)
Admission: RE | Admit: 2014-11-19 | Discharge: 2014-11-19 | Disposition: A | Discharge status: Home / Self Care | Payer: Medicare Other | Source: Ambulatory Visit | Attending: Gastroenterology | Admitting: Gastroenterology

## 2014-11-19 DIAGNOSIS — K625 Hemorrhage of anus and rectum: Secondary | ICD-10-CM | POA: Insufficient documentation

## 2014-11-19 DIAGNOSIS — Z01818 Encounter for other preprocedural examination: Secondary | ICD-10-CM | POA: Diagnosis not present

## 2014-11-19 HISTORY — DX: Acute embolism and thrombosis of unspecified deep veins of unspecified lower extremity: I82.409

## 2014-11-19 HISTORY — DX: Schizophrenia, unspecified: F20.9

## 2014-11-19 LAB — CBC WITH DIFFERENTIAL/PLATELET
Basophils Absolute: 0.1 10*3/uL (ref 0.0–0.1)
Basophils Relative: 1 %
Eosinophils Absolute: 0.2 10*3/uL (ref 0.0–0.7)
Eosinophils Relative: 3 %
HCT: 31.1 % — ABNORMAL LOW (ref 36.0–46.0)
Hemoglobin: 8.6 g/dL — ABNORMAL LOW (ref 12.0–15.0)
Lymphocytes Relative: 32 %
Lymphs Abs: 2.9 10*3/uL (ref 0.7–4.0)
MCH: 19.4 pg — ABNORMAL LOW (ref 26.0–34.0)
MCHC: 27.7 g/dL — ABNORMAL LOW (ref 30.0–36.0)
MCV: 70.2 fL — ABNORMAL LOW (ref 78.0–100.0)
Monocytes Absolute: 0.7 10*3/uL (ref 0.1–1.0)
Monocytes Relative: 7 %
Neutro Abs: 5.3 10*3/uL (ref 1.7–7.7)
Neutrophils Relative %: 58 %
Platelets: 366 10*3/uL (ref 150–400)
RBC: 4.43 MIL/uL (ref 3.87–5.11)
RDW: 17.7 % — ABNORMAL HIGH (ref 11.5–15.5)
WBC: 9.1 10*3/uL (ref 4.0–10.5)

## 2014-11-19 LAB — BASIC METABOLIC PANEL
Anion gap: 7 (ref 5–15)
BUN: 12 mg/dL (ref 6–20)
CO2: 26 mmol/L (ref 22–32)
Calcium: 8.7 mg/dL — ABNORMAL LOW (ref 8.9–10.3)
Chloride: 102 mmol/L (ref 101–111)
Creatinine, Ser: 0.6 mg/dL (ref 0.44–1.00)
GFR calc Af Amer: >60 mL/min (ref >60)
GFR calc non Af Amer: >60 mL/min (ref >60)
Glucose, Bld: 152 mg/dL — ABNORMAL HIGH (ref 65–99)
Potassium: 3.8 mmol/L (ref 3.5–5.1)
Sodium: 135 mmol/L (ref 135–145)

## 2014-11-20 NOTE — Pre-Procedure Instructions (Signed)
Dr Patsey Berthold aware of HGB 8.9.

## 2014-11-24 ENCOUNTER — Ambulatory Visit (HOSPITAL_COMMUNITY)
Admission: RE | Admit: 2014-11-24 | Discharge: 2014-11-24 | Disposition: A | Discharge status: Home / Self Care | Payer: Medicare Other | Source: Ambulatory Visit | Attending: Gastroenterology | Admitting: Gastroenterology

## 2014-11-24 ENCOUNTER — Encounter (HOSPITAL_COMMUNITY)
Admission: RE | Disposition: A | Discharge status: Home / Self Care | Payer: Self-pay | Source: Ambulatory Visit | Attending: Gastroenterology

## 2014-11-24 ENCOUNTER — Ambulatory Visit: Admit: 2014-11-24 | Payer: Medicare Other | Admitting: Gastroenterology

## 2014-11-24 ENCOUNTER — Ambulatory Visit (HOSPITAL_COMMUNITY): Payer: Medicare Other | Admitting: Anesthesiology

## 2014-11-24 ENCOUNTER — Encounter (HOSPITAL_COMMUNITY): Payer: Self-pay | Admitting: *Deleted

## 2014-11-24 DIAGNOSIS — K2970 Gastritis, unspecified, without bleeding: Secondary | ICD-10-CM | POA: Insufficient documentation

## 2014-11-24 DIAGNOSIS — D123 Benign neoplasm of transverse colon: Secondary | ICD-10-CM | POA: Diagnosis not present

## 2014-11-24 DIAGNOSIS — K648 Other hemorrhoids: Secondary | ICD-10-CM | POA: Insufficient documentation

## 2014-11-24 DIAGNOSIS — F418 Other specified anxiety disorders: Secondary | ICD-10-CM | POA: Diagnosis not present

## 2014-11-24 DIAGNOSIS — Z86718 Personal history of other venous thrombosis and embolism: Secondary | ICD-10-CM | POA: Insufficient documentation

## 2014-11-24 DIAGNOSIS — D122 Benign neoplasm of ascending colon: Secondary | ICD-10-CM

## 2014-11-24 DIAGNOSIS — G8190 Hemiplegia, unspecified affecting unspecified side: Secondary | ICD-10-CM | POA: Diagnosis not present

## 2014-11-24 DIAGNOSIS — K921 Melena: Secondary | ICD-10-CM | POA: Diagnosis not present

## 2014-11-24 DIAGNOSIS — Z79899 Other long term (current) drug therapy: Secondary | ICD-10-CM | POA: Diagnosis not present

## 2014-11-24 DIAGNOSIS — I10 Essential (primary) hypertension: Secondary | ICD-10-CM | POA: Diagnosis not present

## 2014-11-24 DIAGNOSIS — D124 Benign neoplasm of descending colon: Secondary | ICD-10-CM | POA: Diagnosis not present

## 2014-11-24 DIAGNOSIS — Z794 Long term (current) use of insulin: Secondary | ICD-10-CM | POA: Insufficient documentation

## 2014-11-24 DIAGNOSIS — F209 Schizophrenia, unspecified: Secondary | ICD-10-CM | POA: Diagnosis not present

## 2014-11-24 DIAGNOSIS — K222 Esophageal obstruction: Secondary | ICD-10-CM | POA: Diagnosis not present

## 2014-11-24 DIAGNOSIS — Z7901 Long term (current) use of anticoagulants: Secondary | ICD-10-CM | POA: Insufficient documentation

## 2014-11-24 DIAGNOSIS — E119 Type 2 diabetes mellitus without complications: Secondary | ICD-10-CM | POA: Diagnosis not present

## 2014-11-24 DIAGNOSIS — K449 Diaphragmatic hernia without obstruction or gangrene: Secondary | ICD-10-CM | POA: Diagnosis not present

## 2014-11-24 DIAGNOSIS — K219 Gastro-esophageal reflux disease without esophagitis: Secondary | ICD-10-CM | POA: Diagnosis not present

## 2014-11-24 DIAGNOSIS — D509 Iron deficiency anemia, unspecified: Secondary | ICD-10-CM | POA: Diagnosis not present

## 2014-11-24 DIAGNOSIS — K2950 Unspecified chronic gastritis without bleeding: Secondary | ICD-10-CM | POA: Diagnosis not present

## 2014-11-24 DIAGNOSIS — K625 Hemorrhage of anus and rectum: Secondary | ICD-10-CM | POA: Diagnosis not present

## 2014-11-24 HISTORY — PX: COLONOSCOPY WITH PROPOFOL: SHX5780

## 2014-11-24 HISTORY — PX: BIOPSY: SHX5522

## 2014-11-24 HISTORY — PX: ESOPHAGOGASTRODUODENOSCOPY (EGD) WITH PROPOFOL: SHX5813

## 2014-11-24 HISTORY — PX: POLYPECTOMY: SHX5525

## 2014-11-24 LAB — GLUCOSE, CAPILLARY
Glucose-Capillary: 160 mg/dL — ABNORMAL HIGH (ref 65–99)
Glucose-Capillary: 137 mg/dL — ABNORMAL HIGH (ref 65–99)

## 2014-11-24 SURGERY — COLONOSCOPY WITH PROPOFOL
Anesthesia: Monitor Anesthesia Care

## 2014-11-24 SURGERY — COLONOSCOPY WITH PROPOFOL
Anesthesia: Monitor Anesthesia Care | Site: Esophagus

## 2014-11-24 MED ORDER — LIDOCAINE HCL (CARDIAC) 10 MG/ML IV SOLN
INTRAVENOUS | Status: DC | PRN
  Administered 2014-11-24: 25 mg via INTRAVENOUS

## 2014-11-24 MED ORDER — ONDANSETRON HCL 4 MG/2ML IJ SOLN: 4.0000 mg | Freq: Once | INTRAMUSCULAR | Status: DC | PRN

## 2014-11-24 MED ORDER — LIDOCAINE VISCOUS 2 % MT SOLN
OROMUCOSAL | Status: AC
  Filled 2014-11-24: qty 15

## 2014-11-24 MED ORDER — LACTATED RINGERS IV SOLN
INTRAVENOUS | Status: DC
  Administered 2014-11-24 (×2): via INTRAVENOUS

## 2014-11-24 MED ORDER — PROPOFOL 500 MG/50ML IV EMUL
INTRAVENOUS | Status: DC | PRN
  Administered 2014-11-24: 11:00:00 via INTRAVENOUS
  Administered 2014-11-24: 100 ug/kg/min via INTRAVENOUS

## 2014-11-24 MED ORDER — ENOXAPARIN SODIUM 80 MG/0.8ML ~~LOC~~ SOLN: 1.0000 mg/kg | Freq: Two times a day (BID) | SUBCUTANEOUS | Status: DC

## 2014-11-24 MED ORDER — ONDANSETRON HCL 4 MG/2ML IJ SOLN
4.0000 mg | Freq: Once | INTRAMUSCULAR | Status: AC
  Administered 2014-11-24: 4 mg via INTRAVENOUS

## 2014-11-24 MED ORDER — MIDAZOLAM HCL 2 MG/2ML IJ SOLN
INTRAMUSCULAR | Status: AC
  Filled 2014-11-24: qty 2

## 2014-11-24 MED ORDER — FENTANYL CITRATE (PF) 100 MCG/2ML IJ SOLN
INTRAMUSCULAR | Status: DC | PRN
  Administered 2014-11-24: 65 ug via INTRAVENOUS

## 2014-11-24 MED ORDER — FENTANYL CITRATE (PF) 100 MCG/2ML IJ SOLN
25.0000 ug | INTRAMUSCULAR | Status: AC
  Administered 2014-11-24 (×2): 25 ug via INTRAVENOUS

## 2014-11-24 MED ORDER — GLYCOPYRROLATE 0.2 MG/ML IJ SOLN
INTRAMUSCULAR | Status: AC
  Filled 2014-11-24: qty 1

## 2014-11-24 MED ORDER — MIDAZOLAM HCL 2 MG/2ML IJ SOLN
1.0000 mg | INTRAMUSCULAR | Status: DC | PRN
  Administered 2014-11-24: 2 mg via INTRAVENOUS

## 2014-11-24 MED ORDER — GLYCOPYRROLATE 0.2 MG/ML IJ SOLN
0.2000 mg | Freq: Once | INTRAMUSCULAR | Status: AC
  Administered 2014-11-24: 0.2 mg via INTRAVENOUS

## 2014-11-24 MED ORDER — FENTANYL CITRATE (PF) 100 MCG/2ML IJ SOLN
INTRAMUSCULAR | Status: AC
  Filled 2014-11-24: qty 2

## 2014-11-24 MED ORDER — PROPOFOL 10 MG/ML IV BOLUS
INTRAVENOUS | Status: AC
  Filled 2014-11-24: qty 20

## 2014-11-24 MED ORDER — WATER FOR IRRIGATION, STERILE IR SOLN
Status: DC | PRN
  Administered 2014-11-24: 1000 mL via SURGICAL_CAVITY

## 2014-11-24 MED ORDER — ONDANSETRON HCL 4 MG/2ML IJ SOLN
INTRAMUSCULAR | Status: AC
  Filled 2014-11-24: qty 2

## 2014-11-24 MED ORDER — LIDOCAINE VISCOUS 2 % MT SOLN
15.0000 mL | Freq: Once | OROMUCOSAL | Status: AC
  Administered 2014-11-24: 6 mL via OROMUCOSAL

## 2014-11-24 MED ORDER — WARFARIN SODIUM 5 MG PO TABS: ORAL_TABLET | ORAL | Status: DC

## 2014-11-24 MED ORDER — FENTANYL CITRATE (PF) 100 MCG/2ML IJ SOLN: 25.0000 ug | INTRAMUSCULAR | Status: DC | PRN

## 2014-11-24 SURGICAL SUPPLY — 24 items
AMPLIFEYE LARGE BLUE (MISCELLANEOUS) ×4 IMPLANT
BLOCK BITE 60FR ADLT L/F BLUE (MISCELLANEOUS) ×1 IMPLANT
ELECT REM PT RETURN 9FT ADLT (ELECTROSURGICAL)
ELECTRODE REM PT RTRN 9FT ADLT (ELECTROSURGICAL) IMPLANT
FCP BXJMBJMB 240X2.8X (CUTTING FORCEPS)
FLOOR PAD 36X40 (MISCELLANEOUS) ×4
FORCEPS BIOP RAD 4 LRG CAP 4 (CUTTING FORCEPS) ×1 IMPLANT
FORCEPS BIOP RJ4 240 W/NDL (CUTTING FORCEPS)
FORCEPS BXJMBJMB 240X2.8X (CUTTING FORCEPS) IMPLANT
FORMALIN 10 PREFIL 20ML (MISCELLANEOUS) ×1 IMPLANT
INJECTOR/SNARE I SNARE (MISCELLANEOUS) IMPLANT
KIT ENDO PROCEDURE PEN (KITS) ×5 IMPLANT
MANIFOLD NEPTUNE II (INSTRUMENTS) ×4 IMPLANT
NDL SCLEROTHERAPY 25GX240 (NEEDLE) IMPLANT
NEEDLE SCLEROTHERAPY 25GX240 (NEEDLE) IMPLANT
PAD FLOOR 36X40 (MISCELLANEOUS) ×3 IMPLANT
PROBE APC STR FIRE (PROBE) IMPLANT
PROBE INJECTION GOLD (MISCELLANEOUS)
PROBE INJECTION GOLD 7FR (MISCELLANEOUS) IMPLANT
SNARE SHORT THROW 13M SML OVAL (MISCELLANEOUS) ×4 IMPLANT
SYR INFLATION 60ML (SYRINGE) IMPLANT
TRAP SPECIMEN MUCOUS 40CC (MISCELLANEOUS) ×1 IMPLANT
TUBING IRRIGATION ENDOGATOR (MISCELLANEOUS) IMPLANT
WATER STERILE IRR 1000ML POUR (IV SOLUTION) ×1 IMPLANT

## 2014-11-24 NOTE — Op Note (Signed)
[facility] [address] [place], [address]   ENDOSCOPY PROCEDURE REPORT  PATIENT: [name], [name]  MR#: [id number] BIRTHDATE: [birth year] , 24  yrs. old GENDER: female  ENDOSCOPIST: Danie Binder, MD REFERRED BM:[id number] [name], M.D. PROCEDURE DATE: 2014/12/22 PROCEDURE:   EGD w/ biopsy  INDICATIONS:iron deficiency anemia. SEP 2016: FERRITIN 6, PCT 2016: Hb 8.6 MEDICATIONS: Monitored anesthesia care  TOPICAL ANESTHETIC: Viscous Xylocaine  ASA CLASS:  DESCRIPTION OF PROCEDURE:     Physical exam was performed.  Informed consent was obtained from the patient after explaining the benefits, risks, and alternatives to the procedure.  The patient was connected to the monitor and placed in the left lateral position.  Continuous oxygen was provided by nasal cannula and IV medicine administered through an indwelling cannula.  After administration of sedation, the patients esophagus was intubated and the     endoscope was advanced under direct visualization to the second portion of the duodenum.  The scope was removed slowly by carefully examining the color, texture, anatomy, and integrity of the mucosa on the way out.  The patient was recovered in endoscopy and discharged home in satisfactory condition.  Estimated blood loss is zero unless otherwise noted in this procedure report.     ESOPHAGUS: There was a stricture at the gastroesophageal junction. The stricture was easily traversable.   STOMACH: A small hiatal hernia was noted.   Mild non-erosive gastritis (inflammation) was found in the gastric antrum.  Multiple biopsies were performed using cold forceps.   DUODENUM: The duodenal mucosa showed no abnormalities in the bulb and 2nd part of the duodenum.  Cold forceps biopsies were taken in the bulb and second portion. COMPLICATIONS: There were no immediate complications.  ENDOSCOPIC IMPRESSION: 1.   PATENT stricture at the gastroesophageal junction 2.   Small  hiatal hernia 3.   MILD Non-erosive gastritis 4.   NO OBVIOUS SOURCE FOR ANEMIA IDENTIFIED  RECOMMENDATIONS: HOLD LOVENOX.  RE-START AT 1800 ON NOV 1. HOLD COUMADIN.  RE-START ON NOV 4. FOLLOW A HIGH FIBER/LOW FAT DIET. AVOID ITEMS THAT TRIGGER GASTRITIS. OMEPRAZOLE 30 MINUTES PRIOR TO FIRST MEAL. CONTINUE LINZESS. AWAIT BIOPSY RESULTS.  PT MAY NEED CAPSULE STUDY. FOLLOW UP IN 4 MOS. Next colonoscopy in 1-3 years.  REPEAT EXAM: eSigned:  Danie Binder, MD 12-22-14 9:36 PM CPT CODES: ICD CODES:  The ICD and CPT codes recommended by this software are interpretations from the data that the clinical staff has captured with the software.  The verification of the translation of this report to the ICD and CPT codes and modifiers is the sole responsibility of the health care institution and practicing physician where this report was generated.  [place]. will not be held responsible for the validity of the ICD and CPT codes included on this report.  AMA assumes no liability for data contained or not contained herein. CPT is a Designer, television/film set of the [employer].

## 2014-11-24 NOTE — Anesthesia Preprocedure Evaluation (Signed)
Anesthesia Evaluation  Patient identified by MRN, date of birth, ID band Patient awake    Reviewed: Allergy & Precautions, NPO status , Patient's Chart, lab work & pertinent test results  Airway Mallampati: III  TM Distance: <3 FB     Dental  (+) Edentulous Upper, Edentulous Lower   Pulmonary neg pulmonary ROS,    breath sounds clear to auscultation       Cardiovascular hypertension, Pt. on medications + Peripheral Vascular Disease and + DVT   Rhythm:Regular Rate:Normal     Neuro/Psych PSYCHIATRIC DISORDERS Anxiety Depression Schizophrenia CVA, Residual Symptoms    GI/Hepatic GERD  Controlled,  Endo/Other  diabetes, Type 2, Oral Hypoglycemic Agents  Renal/GU      Musculoskeletal   Abdominal   Peds  Hematology  (+) anemia ,   Anesthesia Other Findings   Reproductive/Obstetrics                             Anesthesia Physical Anesthesia Plan  ASA: III  Anesthesia Plan: MAC   Post-op Pain Management:    Induction: Intravenous  Airway Management Planned: Simple Face Mask  Additional Equipment:   Intra-op Plan:   Post-operative Plan:   Informed Consent: I have reviewed the patients History and Physical, chart, labs and discussed the procedure including the risks, benefits and alternatives for the proposed anesthesia with the patient or authorized representative who has indicated his/her understanding and acceptance.     Plan Discussed with:   Anesthesia Plan Comments:         Anesthesia Quick Evaluation

## 2014-11-24 NOTE — Discharge Instructions (Signed)
Your rectal bleeding is most likely due to hemorrhoids. Your low blood count is likely due to multiple factors: polyps, gastritis, and your medical problems. You HAD 3 polyps removed. You have mild gastritis AND STOMACH POLYPS.I biopsied your stomach and small bowel.   HOLD LOVENOX. RE-START AT 1800 ON NOV 1.  HOLD COUMADIN. RE-START ON NOV 4.  FOLLOW A HIGH FIBER/LOW FAT DIET. AVOID ITEMS THAT CAUSE BLOATING. SEE INFO BELOW.  AVOID ITEMS THAT TRIGGER GASTRITIS.  CONTINUE OMEPRAZOLE.  TAKE 30 MINUTES PRIOR TO YOUR FIRST MEAL.  YOUR BIOPSY RESULTS WILL BE AVAILABLE IN MY CHART AFTER NOV 3 AND MY OFFICE WILL CONTACT YOU IN 10-14 DAYS WITH YOUR RESULTS.   IF YOUR BIOPSIES DO NOT SHOW A REASON FOR YOUR LOW BLOOD COUNT AND IRON, YOU WILL NEEDS A CAPSULE STUDY OF YOUR SMALL BOWEL.  FOLLOW UP IN 4 MOS.   Next colonoscopy in 1-3 years.   ENDOSCOPY Care After Read the instructions outlined below and refer to this sheet in the next week. These discharge instructions provide you with general information on caring for yourself after you leave the hospital. While your treatment has been planned according to the most current medical practices available, unavoidable complications occasionally occur. If you have any problems or questions after discharge, call DR. Grantley Savage, [phone].  ACTIVITY  You may resume your regular activity, but move at a slower pace for the next 24 hours.   Take frequent rest periods for the next 24 hours.   Walking will help get rid of the air and reduce the bloated feeling in your belly (abdomen).   No driving for 24 hours (because of the medicine (anesthesia) used during the test).   You may shower.   Do not sign any important legal documents or operate any machinery for 24 hours (because of the anesthesia used during the test).    NUTRITION  Drink plenty of fluids.   You may resume your normal diet as instructed by your doctor.   Begin with a light meal  and progress to your normal diet. Heavy or fried foods are harder to digest and may make you feel sick to your stomach (nauseated).   Avoid alcoholic beverages for 24 hours or as instructed.    MEDICATIONS  You may resume your normal medications.   WHAT YOU CAN EXPECT TODAY  Some feelings of bloating in the abdomen.   Passage of more gas than usual.   Spotting of blood in your stool or on the toilet paper  .  IF YOU HAD POLYPS REMOVED DURING THE ENDOSCOPY:  Eat a soft diet IF YOU HAVE NAUSEA, BLOATING, ABDOMINAL PAIN, OR VOMITING.    FINDING OUT THE RESULTS OF YOUR TEST Not all test results are available during your visit. DR. Oneida Alar WILL CALL YOU WITHIN 14 DAYS OF YOUR PROCEDUE WITH YOUR RESULTS. Do not assume everything is normal if you have not heard from DR. Develle Sievers, CALL HER OFFICE AT [phone].  SEEK IMMEDIATE MEDICAL ATTENTION AND CALL THE OFFICE: [phone] IF:  You have more than a spotting of blood in your stool.   Your belly is swollen (abdominal distention).   You are nauseated or vomiting.   You have a temperature over 101F.   You have abdominal pain or discomfort that is severe or gets worse throughout the day.  Polyps, Colon  A polyp is extra tissue that grows inside your body. Colon polyps grow in the large intestine. The large intestine, also called the colon,  is part of your digestive system. It is a long, hollow tube at the end of your digestive tract where your body makes and stores stool. Most polyps are not dangerous. They are benign. This means they are not cancerous. But over time, some types of polyps can turn into cancer. Polyps that are smaller than a pea are usually not harmful. But larger polyps could someday become or may already be cancerous. To be safe, doctors remove all polyps and test them.   WHO GETS POLYPS? Anyone can get polyps, but certain people are more likely than others. You may have a greater chance of getting polyps  if:  You are over 50.   You have had polyps before.   Someone in your family has had polyps.   Someone in your family has had cancer of the large intestine.   Find out if someone in your family has had polyps. You may also be more likely to get polyps if you:   Eat a lot of fatty foods   Smoke   Drink alcohol   Do not exercise  Eat too much   PREVENTION There is not one sure way to prevent polyps. You might be able to lower your risk of getting them if you:  Eat more fruits and vegetables and less fatty food.   Do not smoke.   Avoid alcohol.   Exercise every day.   Lose weight if you are overweight.   Eating more calcium and folate can also lower your risk of getting polyps. Some foods that are rich in calcium are milk, cheese, and broccoli. Some foods that are rich in folate are chickpeas, kidney beans, and spinach.    Gastritis  Gastritis is an inflammation (the body's way of reacting to injury and/or infection) of the stomach. It is often caused by viral or bacterial (germ) infections. It can also be caused BY ASPIRIN, BC/GOODY POWDER'S, (IBUPROFEN) MOTRIN, OR ALEVE (NAPROXEN), chemicals (including alcohol), SPICY FOODS, and medications. This illness may be associated with generalized malaise (feeling tired, not well), UPPER ABDOMINAL STOMACH cramps, and fever. One common bacterial cause of gastritis is an organism known as H. Pylori. This can be treated with antibiotics.    High-Fiber Diet A high-fiber diet changes your normal diet to include more whole grains, legumes, fruits, and vegetables. Changes in the diet involve replacing refined carbohydrates with unrefined foods. The calorie level of the diet is essentially unchanged. The Dietary Reference Intake (recommended amount) for adult males is 38 grams per day. For adult females, it is 25 grams per day. Pregnant and lactating women should consume 28 grams of fiber per day. Fiber is the intact part of a plant that is  not broken down during digestion. Functional fiber is fiber that has been isolated from the plant to provide a beneficial effect in the body. PURPOSE  Increase stool bulk.   Ease and regulate bowel movements.   Lower cholesterol.  INDICATIONS THAT YOU NEED MORE FIBER  Constipation and hemorrhoids.   Uncomplicated diverticulosis (intestine condition) and irritable bowel syndrome.   Weight management.   As a protective measure against hardening of the arteries (atherosclerosis), diabetes, and cancer.   GUIDELINES FOR INCREASING FIBER IN THE DIET  Start adding fiber to the diet slowly. A gradual increase of about 5 more grams (2 slices of whole-wheat bread, 2 servings of most fruits or vegetables, or 1 bowl of high-fiber cereal) per day is best. Too rapid an increase in fiber may result  in constipation, flatulence, and bloating.   Drink enough water and fluids to keep your urine clear or pale yellow. Water, juice, or caffeine-free drinks are recommended. Not drinking enough fluid may cause constipation.   Eat a variety of high-fiber foods rather than one type of fiber.   Try to increase your intake of fiber through using high-fiber foods rather than fiber pills or supplements that contain small amounts of fiber.   The goal is to change the types of food eaten. Do not supplement your present diet with high-fiber foods, but replace foods in your present diet.  INCLUDE A VARIETY OF FIBER SOURCES  Replace refined and processed grains with whole grains, canned fruits with fresh fruits, and incorporate other fiber sources. White rice, white breads, and most bakery goods contain little or no fiber.   Brown whole-grain rice, buckwheat oats, and many fruits and vegetables are all good sources of fiber. These include: broccoli, Brussels sprouts, cabbage, cauliflower, beets, sweet potatoes, white potatoes (skin on), carrots, tomatoes, eggplant, squash, berries, fresh fruits, and dried fruits.    Cereals appear to be the richest source of fiber. Cereal fiber is found in whole grains and bran. Bran is the fiber-rich outer coat of cereal grain, which is largely removed in refining. In whole-grain cereals, the bran remains. In breakfast cereals, the largest amount of fiber is found in those with "bran" in their names. The fiber content is sometimes indicated on the label.   You may need to include additional fruits and vegetables each day.   In baking, for 1 cup white flour, you may use the following substitutions:   1 cup whole-wheat flour minus 2 tablespoons.   1/2 cup white flour plus 1/2 cup whole-wheat flour.   Low-Fat Diet BREADS, CEREALS, PASTA, RICE, DRIED PEAS, AND BEANS These products are high in carbohydrates and most are low in fat. Therefore, they can be increased in the diet as substitutes for fatty foods. They too, however, contain calories and should not be eaten in excess. Cereals can be eaten for snacks as well as for breakfast.  Include foods that contain fiber (fruits, vegetables, whole grains, and legumes). Research shows that fiber may lower blood cholesterol levels, especially the water-soluble fiber found in fruits, vegetables, oat products, and legumes. FRUITS AND VEGETABLES It is good to eat fruits and vegetables. Besides being sources of fiber, both are rich in vitamins and some minerals. They help you get the daily allowances of these nutrients. Fruits and vegetables can be used for snacks and desserts. MEATS Limit lean meat, chicken, [place], and fish to no more than 6 ounces per day. Beef, Pork, and Lamb Use lean cuts of beef, pork, and lamb. Lean cuts include:  Extra-lean ground beef.  Arm roast.  Sirloin tip.  Center-cut ham.  Round steak.  Loin chops.  Rump roast.  Tenderloin.  Trim all fat off the outside of meats before cooking. It is not necessary to severely decrease the intake of red meat, but lean choices should be made. Lean meat is rich in  protein and contains a highly absorbable form of iron. Premenopausal women, in particular, should avoid reducing lean red meat because this could increase the risk for low red blood cells (iron-deficiency anemia). The organ meats, such as liver, sweetbreads, kidneys, and brain are very rich in cholesterol. They should be limited. Chicken and [place] These are good sources of protein. The fat of poultry can be reduced by removing the skin and underlying fat layers  before cooking. Chicken and [place] can be substituted for lean red meat in the diet. Poultry should not be fried or covered with high-fat sauces. Fish and Shellfish Fish is a good source of protein. Shellfish contain cholesterol, but they usually are low in saturated fatty acids. The preparation of fish is important. Like chicken and [place], they should not be fried or covered with high-fat sauces. EGGS Egg whites contain no fat or cholesterol. They can be eaten often. Try 1 to 2 egg whites instead of whole eggs in recipes or use egg substitutes that do not contain yolk. MILK AND DAIRY PRODUCTS Use skim or 1% milk instead of 2% or whole milk. Decrease whole milk, natural, and processed cheeses. Use nonfat or low-fat (2%) cottage cheese or low-fat cheeses made from vegetable oils. Choose nonfat or low-fat (1 to 2%) yogurt. Experiment with evaporated skim milk in recipes that call for heavy cream. Substitute low-fat yogurt or low-fat cottage cheese for sour cream in dips and salad dressings. Have at least 2 servings of low-fat dairy products, such as 2 glasses of skim (or 1%) milk each day to help get your daily calcium intake.  FATS AND OILS Reduce the total intake of fats, especially saturated fat. Butterfat, lard, and beef fats are high in saturated fat and cholesterol. These should be avoided as much as possible. Vegetable fats do not contain cholesterol, but certain vegetable fats, such as coconut oil, palm oil, and palm kernel oil are very  high in saturated fats. These should be limited. These fats are often used in bakery goods, processed foods, popcorn, oils, and nondairy creamers. Vegetable shortenings and some peanut butters contain hydrogenated oils, which are also saturated fats. Read the labels on these foods and check for saturated vegetable oils. Unsaturated vegetable oils and fats do not raise blood cholesterol. However, they should be limited because they are fats and are high in calories. Total fat should still be limited to 30% of your daily caloric intake. Desirable liquid vegetable oils are corn oil, cottonseed oil, olive oil, canola oil, safflower oil, soybean oil, and sunflower oil. Peanut oil is not as good, but small amounts are acceptable. Buy a heart-healthy tub margarine that has no partially hydrogenated oils in the ingredients. Mayonnaise and salad dressings often are made from unsaturated fats, but they should also be limited because of their high calorie and fat content. Seeds, nuts, peanut butter, olives, and avocados are high in fat, but the fat is mainly the unsaturated type. These foods should be limited mainly to avoid excess calories and fat. OTHER EATING TIPS Snacks  Most sweets should be limited as snacks. They tend to be rich in calories and fats, and their caloric content outweighs their nutritional value. Some good choices in snacks are graham crackers, melba toast, soda crackers, bagels (no egg), English muffins, fruits, and vegetables. These snacks are preferable to snack crackers, [place] fries, and chips. Popcorn should be air-popped or cooked in small amounts of liquid vegetable oil. Desserts Eat fruit, low-fat yogurt, and fruit ices. AVOID pastries, cake, and cookies. Sherbet, angel food cake, gelatin dessert, frozen low-fat yogurt, or other frozen products that do not contain saturated fat (pure fruit juice bars, frozen ice pops) are also acceptable.  COOKING METHODS Choose those methods that use  little or no fat. They include: Poaching.  Braising.  Steaming.  Grilling.  Baking.  Stir-frying.  Broiling.  Microwaving.  Foods can be cooked in a nonstick pan without added fat, or use  a nonfat cooking spray in regular cookware. Limit fried foods and avoid frying in saturated fat. Add moisture to lean meats by using water, broth, cooking wines, and other nonfat or low-fat sauces along with the cooking methods mentioned above. Soups and stews should be chilled after cooking. The fat that forms on top after a few hours in the refrigerator should be skimmed off. When preparing meals, avoid using excess salt. Salt can contribute to raising blood pressure in some people. EATING AWAY FROM HOME Order entres, potatoes, and vegetables without sauces or butter. When meat exceeds the size of a deck of cards (3 to 4 ounces), the rest can be taken home for another meal. Choose vegetable or fruit salads and ask for low-calorie salad dressings to be served on the side. Use dressings sparingly. Limit high-fat toppings, such as bacon, crumbled eggs, cheese, sunflower seeds, and olives. Ask for heart-healthy tub margarine instead of butter.   Hemorrhoids Hemorrhoids are dilated (enlarged) veins around the rectum. Sometimes clots will form in the veins. This makes them swollen and painful. These are called thrombosed hemorrhoids. Causes of hemorrhoids include:  Constipation.   Straining to have a bowel movement.   HEAVY LIFTING HOME CARE INSTRUCTIONS  Eat a well balanced diet and drink 6 to 8 glasses of water every day to avoid constipation. You may also use a bulk laxative.   Avoid straining to have bowel movements.   Keep anal area dry and clean.   Do not use a donut shaped pillow or sit on the toilet for long periods. This increases blood pooling and pain.   Move your bowels when your body has the urge; this will require less straining and will decrease pain and pressure.

## 2014-11-24 NOTE — Op Note (Signed)
[facility] [address] [place], [address]   COLONOSCOPY PROCEDURE REPORT  PATIENT: [name], [name]  MR#: [id number] BIRTHDATE: 01-Sep-1945 , 64  yrs. old GENDER: female ENDOSCOPIST: Danie Binder, MD REFERRED MC:[id number] [name], M.D. PROCEDURE DATE:  December 11, 2014 PROCEDURE:   Colonoscopy with snare polypectomy INDICATIONS:anal bleeding. SEP/OCT 2016:  Hb 8.6 FERRITIN 6. MEDICATIONS: Monitored anesthesia care  DESCRIPTION OF PROCEDURE:    Physical exam was performed.  Informed consent was obtained from the patient after explaining the benefits, risks, and alternatives to procedure.  The patient was connected to monitor and placed in left lateral position. Continuous oxygen was provided by nasal cannula and IV medicine administered through an indwelling cannula.  After administration of sedation and rectal exam, the patients rectum was intubated and the     colonoscope was advanced under direct visualization to the cecum.  The scope was removed slowly by carefully examining the color, texture, anatomy, and integrity mucosa on the way out.  The patient was recovered in endoscopy and discharged home in satisfactory condition. Estimated blood loss is zero unless otherwise noted in this procedure report.     COLON FINDINGS: Three sessile polyps ranging from 6 to 72mm in size were found in the descending colon, transverse colon, and ascending colon.  A polypectomy was performed using snare cautery.  , The examination was otherwise normal.  , and Moderate sized internal hemorrhoids were found.  PREP QUALITY: excellent.  CECAL W/D TIME: 19       minutes COMPLICATIONS: None  ENDOSCOPIC IMPRESSION: 1.   Three  COLON polyps REMOVED-NO  OBVIOUS SOURCE FOR PROFOUND IRON DEFICIENCY ANEMIA IDENTIFIED 2.  RECTAL BLEEDING DUE TO Moderate sized internal hemorrhoids  RECOMMENDATIONS: HOLD LOVENOX.  RE-START AT 1800 ON NOV 1. HOLD COUMADIN.  RE-START ON NOV 4. FOLLOW A  HIGH FIBER/LOW FAT DIET. AVOID ITEMS THAT TRIGGER GASTRITIS. OMEPRAZOLE 30 MINUTES PRIOR TO FIRST MEAL. AWAIT BIOPSY RESULTS.  PT MAY NEED CAPSULE STUDY. FOLLOW UP IN 4 MOS. Next colonoscopy in 1-3 years.   eSigned:  Danie Binder, MD Dec 11, 2014 9:31 PM   CPT CODES: ICD CODES:  The ICD and CPT codes recommended by this software are interpretations from the data that the clinical staff has captured with the software.  The verification of the translation of this report to the ICD and CPT codes and modifiers is the sole responsibility of the health care institution and practicing physician where this report was generated.  [place]. will not be held responsible for the validity of the ICD and CPT codes included on this report.  AMA assumes no liability for data contained or not contained herein. CPT is a Designer, television/film set of the [employer].

## 2014-11-24 NOTE — Anesthesia Procedure Notes (Signed)
Procedure Name: MAC Date/Time: 11/24/2014 10:07 AM Performed by: Andree Elk, Desmond Tufano A Pre-anesthesia Checklist: Patient identified, Timeout performed, Emergency Drugs available, Suction available and Patient being monitored Oxygen Delivery Method: Simple face mask

## 2014-11-24 NOTE — H&P (Signed)
Primary Care Physician:  [name], MD Primary Gastroenterologist:  Dr. Oneida Alar  Pre-Procedure History & Physical: HPI:  [name] is a 69 y.o. female here for  BRBPR.  Past Medical History  Diagnosis Date  . DVT, lower extremity, recurrent (HCC)     Long-term Coumadin per Dr. Legrand Rams  . Type 2 diabetes mellitus ([place])   . Anxiety   . Depression     History of psychosis and previous suicide attempt  . Hemiplegia ([place])   . GERD (gastroesophageal reflux disease)   . History of stroke     Acute infarct and right cerebral white matter small vessel disease 12/10  . Arthritis   . Essential hypertension   . Schizophrenia ([place])   . Leg DVT (deep venous thromboembolism), acute ([place]) 2006  . Hemiplegia ([place]) 2010    left side  . Stroke [facility])     Past Surgical History  Procedure Laterality Date  . Back surgery      Prior to Admission medications   Medication Sig Start Date End Date Taking? Authorizing Provider  acetaminophen (TYLENOL) 325 MG tablet Take 650 mg by mouth every 6 (six) hours as needed. For fever > 101    Yes Historical Provider, MD  ARIPiprazole (ABILIFY) 2 MG tablet  10/08/14  Yes Historical Provider, MD  calcium carbonate (TUMS - DOSED IN MG ELEMENTAL CALCIUM) 500 MG chewable tablet Chew 1 tablet by mouth as needed for indigestion or heartburn.   Yes Historical Provider, MD  canagliflozin (INVOKANA) 100 MG TABS tablet Take 100 mg by mouth daily.   Yes Historical Provider, MD  clonazePAM (KLONOPIN) 0.5 MG tablet 3 (three) times daily.  10/08/14  Yes Historical Provider, MD  docusate sodium (COLACE) 100 MG capsule Take 100 mg by mouth at bedtime.   Yes Historical Provider, MD  DULoxetine (CYMBALTA) 60 MG capsule Take 120 mg by mouth daily.    Yes Historical Provider, MD  enalapril (VASOTEC) 5 MG tablet Take 2.5 mg by mouth every morning.    Yes Historical Provider, MD  enoxaparin (LOVENOX) 80 MG/0.8ML injection Inject 0.8 mLs (80 mg total) into the skin every 12  (twelve) hours. Starting October 29th, last dose the morning of October 31st. 11/04/14  Yes Orvil Feil, NP  HYDROcodone-acetaminophen (NORCO) 5-325 MG per tablet Take 1 tablet by mouth 3 (three) times daily.    Yes Historical Provider, MD  insulin aspart (NOVOLOG) 100 UNIT/ML injection Inject 35 Units into the skin 3 (three) times daily before meals.   Yes Historical Provider, MD  LEVEMIR FLEXTOUCH 100 UNIT/ML Pen INJECT 100 UNITS SUBCUTANEOUSLY AT BEDTIME. 11/18/14  Yes Cassandria Anger, MD  Linaclotide (LINZESS) 290 MCG CAPS capsule Take 1 capsule (290 mcg total) by mouth daily. On an empty stomach, 30 minutes before breakfast 10/21/14  Yes Orvil Feil, NP  loperamide (IMODIUM) 2 MG capsule Take 4 mg by mouth daily as needed for diarrhea or loose stools. Take 2 capsules initially then take 2 after each loose stool per [facility]   Yes Historical Provider, MD  metoprolol succinate (TOPROL-XL) 25 MG 24 hr tablet Take 12.5 mg by mouth 2 (two) times daily.    Yes Historical Provider, MD  mirtazapine (REMERON) 30 MG tablet Take 30 mg by mouth at bedtime.     Yes Historical Provider, MD  Na Sulfate-K Sulfate-Mg Sulf (SUPREP BOWEL PREP) SOLN Take 1 kit by mouth as directed. 11/05/14  Yes Danie Binder, MD  omeprazole (PRILOSEC) 40 MG capsule Take  40 mg by mouth daily.   Yes Historical Provider, MD  polyvinyl alcohol (ARTIFICIAL TEARS) 1.4 % ophthalmic solution Place 1 drop into both eyes 3 (three) times daily.   Yes Historical Provider, MD  risperiDONE (RISPERDAL) 1 MG tablet Take 1 mg by mouth at bedtime.   Yes Historical Provider, MD  simvastatin (ZOCOR) 20 MG tablet Take 20 mg by mouth at bedtime.   Yes Historical Provider, MD  sodium chloride (OCEAN) 0.65 % SOLN nasal spray Place 2 sprays into both nostrils 4 (four) times daily.   Yes Historical Provider, MD  solifenacin (VESICARE) 5 MG tablet Take 5 mg by mouth daily.   Yes Historical Provider, MD  tiZANidine (ZANAFLEX) 2 MG tablet Take 2 mg by mouth  daily.   Yes Historical Provider, MD  Vitamin D, Ergocalciferol, (DRISDOL) 50000 UNITS CAPS capsule Take 50,000 Units by mouth every 7 (seven) days.   Yes Historical Provider, MD  warfarin (COUMADIN) 5 MG tablet TAKE ONE TABLET BY MOUTH ONCE DAILY EXCEPT ADD A 2.5MG ON MONDAYS. Patient taking differently: TAKE ONE TABLET BY MOUTH ONCE DAILY 05/12/14  Yes Satira Sark, MD  zolpidem (AMBIEN) 10 MG tablet Take 10 mg by mouth at bedtime.   Yes Historical Provider, MD  ARIPiprazole (ABILIFY) 2 MG tablet Take 1 tablet (2 mg total) by mouth daily. 05/25/11 09/14/14  Nat Christen, MD  cephALEXin (KEFLEX) 500 MG capsule Take 1 capsule (500 mg total) by mouth 4 (four) times daily. Patient not taking: Reported on 11/17/2014 09/14/14   Ezequiel Essex, MD  clonazePAM (KLONOPIN) 0.5 MG tablet Take 2 tablets (1 mg total) by mouth 3 (three) times daily. Patient taking differently: Take 0.5 mg by mouth 3 (three) times daily.  05/25/11 09/14/14  Nat Christen, MD    Allergies as of 11/05/2014 - Review Complete 10/21/2014  Allergen Reaction Noted  . Sulfa antibiotics Rash 02/23/2010  . Sulfonamide derivatives Rash     Family History  Problem Relation Age of Onset  . Colon cancer Neg Hx     Social History   Social History  . Marital Status: Widowed    Spouse Name: N/A  . Number of Children: N/A  . Years of Education: N/A   Occupational History  . Not on file.   Social History Main Topics  . Smoking status: Never Smoker   . Smokeless tobacco: Not on file  . Alcohol Use: No  . Drug Use: No  . Sexual Activity: No   Other Topics Concern  . Not on file   Social History Narrative    Review of Systems: See HPI, otherwise negative ROS   Physical Exam: BP 122/67 mmHg  Pulse 83  Temp(Src) 97.8 F (36.6 C) (Oral)  Resp 17  Ht 5' 2" (1.575 m)  Wt 174 lb (78.926 kg)  BMI 31.82 kg/m2  SpO2 93% General:   Alert,  pleasant and cooperative in NAD Head:  Normocephalic and atraumatic. Neck:   Supple; Lungs:  Clear throughout to auscultation.    Heart:  Regular rate and rhythm. Abdomen:  Soft, nontender and nondistended. Normal bowel sounds, without guarding, and without rebound.   Neurologic:  Alert and  oriented x4;  grossly normal neurologically.  Impression/Plan:   BRBPR  PLAN: TCS/POSSIBLE EGD TODAY

## 2014-11-24 NOTE — Progress Notes (Signed)
REVIEWED-NO ADDITIONAL RECOMMENDATIONS. 

## 2014-11-24 NOTE — Anesthesia Postprocedure Evaluation (Signed)
  Anesthesia Post-op Note  Patient: [name]  Procedure(s) Performed: Procedure(s) with comments: COLONOSCOPY WITH PROPOFOL (N/A) - in cecum @ 1030, out @ 1049 ESOPHAGOGASTRODUODENOSCOPY (EGD) WITH PROPOFOL (N/A) POLYPECTOMY (N/A) BIOPSY (N/A)  Patient Location: PACU  Anesthesia Type:MAC  Level of Consciousness: awake, alert , oriented and patient cooperative  Airway and Oxygen Therapy: Patient Spontanous Breathing and Patient connected to face mask oxygen  Post-op Pain: none  Post-op Assessment: Post-op Vital signs reviewed, Patient's Cardiovascular Status Stable, Respiratory Function Stable, Patent Airway and No signs of Nausea or vomiting              Post-op Vital Signs: Reviewed and stable  Last Vitals:  Filed Vitals:   11/24/14 1118  BP: 126/62  Pulse: 87  Temp: 37.1 C  Resp: 16    Complications: No apparent anesthesia complications

## 2014-11-24 NOTE — Transfer of Care (Signed)
Immediate Anesthesia Transfer of Care Note  Patient: [name]  Procedure(s) Performed: Procedure(s) with comments: COLONOSCOPY WITH PROPOFOL (N/A) - in cecum @ 1030, out @ 1049 ESOPHAGOGASTRODUODENOSCOPY (EGD) WITH PROPOFOL (N/A) POLYPECTOMY (N/A) BIOPSY (N/A)  Patient Location: PACU  Anesthesia Type:MAC  Level of Consciousness: awake, alert , oriented and patient cooperative  Airway & Oxygen Therapy: Patient Spontanous Breathing and Patient connected to face mask oxygen  Post-op Assessment: Report given to RN and Post -op Vital signs reviewed and stable  Post vital signs: Reviewed and stable  Last Vitals:  Filed Vitals:   11/24/14 1005  BP: 130/62  Pulse:   Temp:   Resp: 19    Complications: No apparent anesthesia complications

## 2014-11-25 ENCOUNTER — Encounter (HOSPITAL_COMMUNITY): Payer: Self-pay | Admitting: Gastroenterology

## 2014-11-30 ENCOUNTER — Ambulatory Visit (INDEPENDENT_AMBULATORY_CARE_PROVIDER_SITE_OTHER): Payer: Medicare Other | Admitting: *Deleted

## 2014-11-30 DIAGNOSIS — Z8679 Personal history of other diseases of the circulatory system: Secondary | ICD-10-CM | POA: Diagnosis not present

## 2014-11-30 DIAGNOSIS — Z5181 Encounter for therapeutic drug level monitoring: Secondary | ICD-10-CM

## 2014-11-30 DIAGNOSIS — I82409 Acute embolism and thrombosis of unspecified deep veins of unspecified lower extremity: Secondary | ICD-10-CM | POA: Diagnosis not present

## 2014-11-30 DIAGNOSIS — Z7901 Long term (current) use of anticoagulants: Secondary | ICD-10-CM

## 2014-11-30 LAB — POCT INR: INR: 1.7

## 2014-11-30 NOTE — Progress Notes (Signed)
Quick Note:  Called and informed Butch Penny @ [facility] and also faxing to her @ [phone]. ______

## 2014-12-02 DIAGNOSIS — N390 Urinary tract infection, site not specified: Secondary | ICD-10-CM | POA: Diagnosis not present

## 2014-12-02 DIAGNOSIS — G4700 Insomnia, unspecified: Secondary | ICD-10-CM | POA: Diagnosis not present

## 2014-12-02 DIAGNOSIS — I6350 Cerebral infarction due to unspecified occlusion or stenosis of unspecified cerebral artery: Secondary | ICD-10-CM | POA: Diagnosis not present

## 2014-12-02 DIAGNOSIS — G8190 Hemiplegia, unspecified affecting unspecified side: Secondary | ICD-10-CM | POA: Diagnosis not present

## 2014-12-02 DIAGNOSIS — M5489 Other dorsalgia: Secondary | ICD-10-CM | POA: Diagnosis not present

## 2014-12-02 DIAGNOSIS — K8020 Calculus of gallbladder without cholecystitis without obstruction: Secondary | ICD-10-CM | POA: Diagnosis not present

## 2014-12-02 DIAGNOSIS — I82549 Chronic embolism and thrombosis of unspecified tibial vein: Secondary | ICD-10-CM | POA: Diagnosis not present

## 2014-12-02 DIAGNOSIS — I825Y9 Chronic embolism and thrombosis of unspecified deep veins of unspecified proximal lower extremity: Secondary | ICD-10-CM | POA: Diagnosis not present

## 2014-12-02 DIAGNOSIS — I10 Essential (primary) hypertension: Secondary | ICD-10-CM | POA: Diagnosis not present

## 2014-12-02 DIAGNOSIS — M159 Polyosteoarthritis, unspecified: Secondary | ICD-10-CM | POA: Diagnosis not present

## 2014-12-02 DIAGNOSIS — E1165 Type 2 diabetes mellitus with hyperglycemia: Secondary | ICD-10-CM | POA: Diagnosis not present

## 2014-12-04 ENCOUNTER — Telehealth: Payer: Self-pay | Admitting: Gastroenterology

## 2014-12-04 NOTE — Telephone Encounter (Signed)
Please call pt. She had simple adenomas removed. HER stomach Bx shows gastritis.  HER SMALL BOWEL BIOPSIES ARE NORMAL.   FOLLOW A HIGH FIBER/LOW FAT DIET. AVOID ITEMS THAT CAUSE BLOATING. AVOID ITEMS THAT TRIGGER GASTRITIS.  CONTINUE OMEPRAZOLE.  TAKE 30 MINUTES PRIOR TO YOUR FIRST MEAL.  YOUR BIOPSIES DO NOT SHOW A REASON FOR YOUR LOW BLOOD COUNT AND IRON, YOU NEEDS A CAPSULE STUDY OF YOUR SMALL BOWEL.  FOLLOW UP IN 4 MOS E30 ANEMIA/GASTRITIS/SIMPLE ADENOMAS.   Next colonoscopy in 3 years.

## 2014-12-04 NOTE — Telephone Encounter (Signed)
GIVENS CAPSULE WITHIN 7 DAYS, Dx: ANEMIA/OBSCURE GI BLEED.

## 2014-12-06 DIAGNOSIS — F331 Major depressive disorder, recurrent, moderate: Secondary | ICD-10-CM | POA: Diagnosis not present

## 2014-12-07 ENCOUNTER — Encounter: Payer: Self-pay | Admitting: Gastroenterology

## 2014-12-07 ENCOUNTER — Other Ambulatory Visit: Payer: Self-pay

## 2014-12-07 DIAGNOSIS — D649 Anemia, unspecified: Secondary | ICD-10-CM

## 2014-12-07 DIAGNOSIS — K922 Gastrointestinal hemorrhage, unspecified: Secondary | ICD-10-CM

## 2014-12-07 NOTE — Telephone Encounter (Signed)
Pt is set for [facility] on 12/11/2014 @ 0730 am . Faxed instructions to Butch Penny at [facility].

## 2014-12-07 NOTE — Telephone Encounter (Signed)
Butch Penny @ [facility] is aware of results. Info printed and faxed to her. Given has already been scheduled.

## 2014-12-07 NOTE — Telephone Encounter (Signed)
REVIEWED-NO ADDITIONAL RECOMMENDATIONS. 

## 2014-12-07 NOTE — Telephone Encounter (Signed)
APPT MADE AND ON RECALL  °

## 2014-12-10 ENCOUNTER — Emergency Department (HOSPITAL_COMMUNITY): Payer: Medicare Other

## 2014-12-10 ENCOUNTER — Emergency Department (HOSPITAL_COMMUNITY)
Admission: EM | Admit: 2014-12-10 | Discharge: 2014-12-10 | Disposition: A | Discharge status: Home / Self Care | Payer: Medicare Other | Attending: Emergency Medicine | Admitting: Emergency Medicine

## 2014-12-10 ENCOUNTER — Encounter (HOSPITAL_COMMUNITY): Payer: Self-pay | Admitting: Emergency Medicine

## 2014-12-10 DIAGNOSIS — I10 Essential (primary) hypertension: Secondary | ICD-10-CM | POA: Diagnosis not present

## 2014-12-10 DIAGNOSIS — G8192 Hemiplegia, unspecified affecting left dominant side: Secondary | ICD-10-CM | POA: Diagnosis not present

## 2014-12-10 DIAGNOSIS — F329 Major depressive disorder, single episode, unspecified: Secondary | ICD-10-CM | POA: Insufficient documentation

## 2014-12-10 DIAGNOSIS — M1990 Unspecified osteoarthritis, unspecified site: Secondary | ICD-10-CM | POA: Insufficient documentation

## 2014-12-10 DIAGNOSIS — F419 Anxiety disorder, unspecified: Secondary | ICD-10-CM | POA: Diagnosis not present

## 2014-12-10 DIAGNOSIS — S0990XA Unspecified injury of head, initial encounter: Secondary | ICD-10-CM

## 2014-12-10 DIAGNOSIS — E119 Type 2 diabetes mellitus without complications: Secondary | ICD-10-CM | POA: Diagnosis not present

## 2014-12-10 DIAGNOSIS — Y9289 Other specified places as the place of occurrence of the external cause: Secondary | ICD-10-CM | POA: Diagnosis not present

## 2014-12-10 DIAGNOSIS — Z794 Long term (current) use of insulin: Secondary | ICD-10-CM | POA: Insufficient documentation

## 2014-12-10 DIAGNOSIS — R531 Weakness: Secondary | ICD-10-CM | POA: Diagnosis not present

## 2014-12-10 DIAGNOSIS — R51 Headache: Secondary | ICD-10-CM | POA: Diagnosis not present

## 2014-12-10 DIAGNOSIS — K219 Gastro-esophageal reflux disease without esophagitis: Secondary | ICD-10-CM | POA: Diagnosis not present

## 2014-12-10 DIAGNOSIS — Z86718 Personal history of other venous thrombosis and embolism: Secondary | ICD-10-CM | POA: Insufficient documentation

## 2014-12-10 DIAGNOSIS — Z79899 Other long term (current) drug therapy: Secondary | ICD-10-CM | POA: Diagnosis not present

## 2014-12-10 DIAGNOSIS — Y998 Other external cause status: Secondary | ICD-10-CM | POA: Insufficient documentation

## 2014-12-10 DIAGNOSIS — R404 Transient alteration of awareness: Secondary | ICD-10-CM | POA: Diagnosis not present

## 2014-12-10 DIAGNOSIS — Z7901 Long term (current) use of anticoagulants: Secondary | ICD-10-CM | POA: Diagnosis not present

## 2014-12-10 DIAGNOSIS — F29 Unspecified psychosis not due to a substance or known physiological condition: Secondary | ICD-10-CM | POA: Diagnosis not present

## 2014-12-10 DIAGNOSIS — R42 Dizziness and giddiness: Secondary | ICD-10-CM | POA: Diagnosis not present

## 2014-12-10 DIAGNOSIS — Z8673 Personal history of transient ischemic attack (TIA), and cerebral infarction without residual deficits: Secondary | ICD-10-CM | POA: Insufficient documentation

## 2014-12-10 DIAGNOSIS — Y9389 Activity, other specified: Secondary | ICD-10-CM | POA: Insufficient documentation

## 2014-12-10 DIAGNOSIS — F209 Schizophrenia, unspecified: Secondary | ICD-10-CM | POA: Insufficient documentation

## 2014-12-10 DIAGNOSIS — S0003XA Contusion of scalp, initial encounter: Secondary | ICD-10-CM | POA: Diagnosis not present

## 2014-12-10 DIAGNOSIS — S0083XA Contusion of other part of head, initial encounter: Secondary | ICD-10-CM | POA: Diagnosis not present

## 2014-12-10 DIAGNOSIS — G4489 Other headache syndrome: Secondary | ICD-10-CM | POA: Diagnosis not present
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
## 2014-12-10 NOTE — Discharge Instructions (Signed)
As discussed, your evaluation today has been largely reassuring.  But, it is important that you monitor your condition carefully, and do not hesitate to return to the ED if you develop new, or concerning changes in your condition. ? ?Otherwise, please follow-up with your physician for appropriate ongoing care. ? ?

## 2014-12-10 NOTE — ED Notes (Signed)
Report given to LPN at [facility].Patient with no complaints at this time. Respirations even and unlabored. Skin warm/dry. Discharge instructions reviewed with patient at this time. Patient given opportunity to voice concerns/ask questions.Patient discharged at this time and left Emergency Department via wheelchair.

## 2014-12-10 NOTE — ED Notes (Signed)
Patient ambulatory to the restroom with walker

## 2014-12-10 NOTE — ED Notes (Signed)
Patient given sprite per request. 
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
## 2014-12-11 ENCOUNTER — Encounter (HOSPITAL_COMMUNITY)
Admission: RE | Disposition: A | Discharge status: Home / Self Care | Payer: Self-pay | Source: Ambulatory Visit | Attending: Gastroenterology

## 2014-12-11 ENCOUNTER — Ambulatory Visit (HOSPITAL_COMMUNITY)
Admission: RE | Admit: 2014-12-11 | Discharge: 2014-12-11 | Disposition: A | Discharge status: Home / Self Care | Payer: Medicare Other | Source: Ambulatory Visit | Attending: Gastroenterology | Admitting: Gastroenterology

## 2014-12-11 DIAGNOSIS — Z79899 Other long term (current) drug therapy: Secondary | ICD-10-CM | POA: Insufficient documentation

## 2014-12-11 DIAGNOSIS — K922 Gastrointestinal hemorrhage, unspecified: Secondary | ICD-10-CM | POA: Diagnosis not present

## 2014-12-11 DIAGNOSIS — D509 Iron deficiency anemia, unspecified: Secondary | ICD-10-CM | POA: Diagnosis not present

## 2014-12-11 DIAGNOSIS — E119 Type 2 diabetes mellitus without complications: Secondary | ICD-10-CM | POA: Insufficient documentation

## 2014-12-11 DIAGNOSIS — Z7984 Long term (current) use of oral hypoglycemic drugs: Secondary | ICD-10-CM | POA: Insufficient documentation

## 2014-12-11 HISTORY — PX: GIVENS CAPSULE STUDY: SHX5432

## 2014-12-11 SURGERY — IMAGING PROCEDURE, GI TRACT, INTRALUMINAL, VIA CAPSULE

## 2014-12-14 ENCOUNTER — Other Ambulatory Visit: Payer: Self-pay | Admitting: "Endocrinology

## 2014-12-15 ENCOUNTER — Encounter (HOSPITAL_COMMUNITY): Payer: Self-pay | Admitting: Gastroenterology

## 2014-12-16 ENCOUNTER — Ambulatory Visit (INDEPENDENT_AMBULATORY_CARE_PROVIDER_SITE_OTHER): Payer: Medicare Other | Admitting: *Deleted

## 2014-12-16 DIAGNOSIS — I82409 Acute embolism and thrombosis of unspecified deep veins of unspecified lower extremity: Secondary | ICD-10-CM

## 2014-12-16 DIAGNOSIS — Z5181 Encounter for therapeutic drug level monitoring: Secondary | ICD-10-CM | POA: Diagnosis not present

## 2014-12-16 DIAGNOSIS — Z8679 Personal history of other diseases of the circulatory system: Secondary | ICD-10-CM

## 2014-12-16 DIAGNOSIS — Z7901 Long term (current) use of anticoagulants: Secondary | ICD-10-CM | POA: Diagnosis not present

## 2014-12-16 LAB — POCT INR: INR: 3

## 2014-12-23 DIAGNOSIS — E1165 Type 2 diabetes mellitus with hyperglycemia: Secondary | ICD-10-CM | POA: Diagnosis not present

## 2014-12-23 DIAGNOSIS — S0003XD Contusion of scalp, subsequent encounter: Secondary | ICD-10-CM | POA: Diagnosis not present

## 2014-12-23 DIAGNOSIS — G8190 Hemiplegia, unspecified affecting unspecified side: Secondary | ICD-10-CM | POA: Diagnosis not present

## 2014-12-23 DIAGNOSIS — I825Y9 Chronic embolism and thrombosis of unspecified deep veins of unspecified proximal lower extremity: Secondary | ICD-10-CM | POA: Diagnosis not present

## 2014-12-24 NOTE — Procedures (Signed)
  PATIENT DATA: GASTRIC PASSAGE TIME: 1 H 59m, SB PASSAGE TIME: 3 H 82m  RESULTS: LIMITED views of gastric mucosa due to retained contents.MULTILPLE EROSION IN the stomach WITH ACTIVE OOZING. OCCASIONAL EROSIONS AND RARE ULCER SEEN IN PROXIMAL SMALL BOWEL . No masses or AVMs SEEN. NO OLD BLOOD OR FRESH BLOOD SEEN. LIMITED VIEWS OF THE COLON DUE TO RETAINED CONTENTS.  DIAGNOSIS: IRON DEFICIENCY ANEMIA DUE TO EROSIVE GASTROPATHY/ENTEROPATHY  Plan: 1. IVFE/HEMATOLOGY CONSULT 2. CONTINUE COUMADIN 3. OPV IN 4 MOS

## 2015-01-12 ENCOUNTER — Other Ambulatory Visit: Payer: Self-pay | Admitting: "Endocrinology

## 2015-01-13 ENCOUNTER — Ambulatory Visit (INDEPENDENT_AMBULATORY_CARE_PROVIDER_SITE_OTHER): Payer: Medicare Other | Admitting: Pharmacist

## 2015-01-13 DIAGNOSIS — I82409 Acute embolism and thrombosis of unspecified deep veins of unspecified lower extremity: Secondary | ICD-10-CM | POA: Diagnosis not present

## 2015-01-13 DIAGNOSIS — Z7901 Long term (current) use of anticoagulants: Secondary | ICD-10-CM | POA: Diagnosis not present

## 2015-01-13 DIAGNOSIS — Z8679 Personal history of other diseases of the circulatory system: Secondary | ICD-10-CM

## 2015-01-13 DIAGNOSIS — Z5181 Encounter for therapeutic drug level monitoring: Secondary | ICD-10-CM | POA: Diagnosis not present

## 2015-01-13 LAB — POCT INR: INR: 3.3

## 2015-01-22 DIAGNOSIS — G8190 Hemiplegia, unspecified affecting unspecified side: Secondary | ICD-10-CM | POA: Diagnosis not present

## 2015-01-22 DIAGNOSIS — I825Y9 Chronic embolism and thrombosis of unspecified deep veins of unspecified proximal lower extremity: Secondary | ICD-10-CM | POA: Diagnosis not present

## 2015-01-22 DIAGNOSIS — E1165 Type 2 diabetes mellitus with hyperglycemia: Secondary | ICD-10-CM | POA: Diagnosis not present

## 2015-01-28 ENCOUNTER — Telehealth: Payer: Self-pay | Admitting: Gastroenterology

## 2015-01-28 ENCOUNTER — Other Ambulatory Visit: Payer: Self-pay

## 2015-01-28 DIAGNOSIS — D649 Anemia, unspecified: Secondary | ICD-10-CM

## 2015-01-28 MED ORDER — BIFERA 28 MG PO TABS: ORAL_TABLET | ORAL | Status: DC

## 2015-01-28 NOTE — Telephone Encounter (Signed)
PLEASE CALL PT. HER GIVENS STUDY IN NOV 2016 SHOWED EROSIVE GASTRITIS/DUODENITIS.  SHE SHOULD:  1. HAVE A CBC/FERRITIN ONE WEEK BEFORE HER NEXT OPV IN 4 MOS E30 OBSCURE GI BLEED/ANEMIA 2. CONTINUE TO AVOID ASA/NSAIDS FOR NEXT 4 MOS 3. TAKE BIFERA BID FOR 3 MOS

## 2015-01-28 NOTE — Telephone Encounter (Signed)
I called and gave the info to [facility] at [facility]. Faxing the info also to her at [phone].  Lab orders on file for 4 months and will be mailed to pt prior to OV appt.

## 2015-01-29 ENCOUNTER — Telehealth: Payer: Self-pay

## 2015-01-29 MED ORDER — FERROUS SULFATE DRIED ER 160 (50 FE) MG PO TBCR: 160.0000 mg | EXTENDED_RELEASE_TABLET | Freq: Every day | ORAL | Status: DC

## 2015-01-29 NOTE — Telephone Encounter (Signed)
PLEASE CALL PT'S FACILITY & pharmacy. Rx sent for Slow Fe daily.

## 2015-01-29 NOTE — Telephone Encounter (Signed)
T/C from [place] at [place], concerned they do not have the Bifera to start and I told her that [facility] Post had called and I have sent the message to Dr. Oneida Alar for next week.

## 2015-01-29 NOTE — Telephone Encounter (Signed)
APPT MADE

## 2015-01-29 NOTE — Telephone Encounter (Signed)
Butch Penny at [facility] aware.

## 2015-01-29 NOTE — Telephone Encounter (Signed)
Forwarding to Laban Emperor, NP to advise if it is Ok to wait for Dr. Oneida Alar.

## 2015-01-29 NOTE — Telephone Encounter (Signed)
T/C from [place] at [facility] is not available and he would like to know if Dr. Oneida Alar can send in order for something different. He is aware that Dr.Fields is off and will be at the hospital on Monday and I am sending this message to her for then.

## 2015-02-01 ENCOUNTER — Other Ambulatory Visit: Payer: Self-pay | Admitting: Cardiovascular Disease

## 2015-02-03 ENCOUNTER — Ambulatory Visit (INDEPENDENT_AMBULATORY_CARE_PROVIDER_SITE_OTHER): Payer: Medicare Other | Admitting: *Deleted

## 2015-02-03 DIAGNOSIS — Z8679 Personal history of other diseases of the circulatory system: Secondary | ICD-10-CM

## 2015-02-03 DIAGNOSIS — I82409 Acute embolism and thrombosis of unspecified deep veins of unspecified lower extremity: Secondary | ICD-10-CM

## 2015-02-03 DIAGNOSIS — Z7901 Long term (current) use of anticoagulants: Secondary | ICD-10-CM

## 2015-02-03 DIAGNOSIS — Z5181 Encounter for therapeutic drug level monitoring: Secondary | ICD-10-CM

## 2015-02-03 LAB — POCT INR: INR: 3

## 2015-02-08 ENCOUNTER — Other Ambulatory Visit: Payer: Self-pay | Admitting: "Endocrinology

## 2015-02-08 DIAGNOSIS — E1159 Type 2 diabetes mellitus with other circulatory complications: Secondary | ICD-10-CM | POA: Diagnosis not present

## 2015-02-08 LAB — BASIC METABOLIC PANEL
BUN: 8 mg/dL (ref 7–25)
CO2: 29 mmol/L (ref 20–31)
Calcium: 9 mg/dL (ref 8.6–10.4)
Chloride: 98 mmol/L (ref 98–110)
Creat: 0.67 mg/dL (ref 0.50–0.99)
Glucose, Bld: 55 mg/dL — ABNORMAL LOW (ref 65–99)
Potassium: 3.9 mmol/L (ref 3.5–5.3)
Sodium: 137 mmol/L (ref 135–146)

## 2015-02-08 LAB — HEMOGLOBIN A1C
Hgb A1c MFr Bld: 7.7 % — ABNORMAL HIGH (ref <5.7)
Mean Plasma Glucose: 174 mg/dL — ABNORMAL HIGH (ref <117)

## 2015-02-09 DIAGNOSIS — M79675 Pain in left toe(s): Secondary | ICD-10-CM | POA: Diagnosis not present

## 2015-02-09 DIAGNOSIS — B351 Tinea unguium: Secondary | ICD-10-CM | POA: Diagnosis not present

## 2015-02-09 DIAGNOSIS — M79674 Pain in right toe(s): Secondary | ICD-10-CM | POA: Diagnosis not present

## 2015-02-12 ENCOUNTER — Ambulatory Visit (INDEPENDENT_AMBULATORY_CARE_PROVIDER_SITE_OTHER): Payer: Medicare Other | Admitting: "Endocrinology

## 2015-02-12 ENCOUNTER — Encounter: Payer: Self-pay | Admitting: "Endocrinology

## 2015-02-12 VITALS — BP 96/54 | HR 111 | Ht 67.0 in | Wt 172.0 lb

## 2015-02-12 DIAGNOSIS — Z9189 Other specified personal risk factors, not elsewhere classified: Secondary | ICD-10-CM

## 2015-02-12 DIAGNOSIS — I10 Essential (primary) hypertension: Secondary | ICD-10-CM

## 2015-02-12 DIAGNOSIS — E785 Hyperlipidemia, unspecified: Secondary | ICD-10-CM | POA: Diagnosis not present

## 2015-02-12 DIAGNOSIS — E1159 Type 2 diabetes mellitus with other circulatory complications: Secondary | ICD-10-CM | POA: Diagnosis not present

## 2015-02-12 NOTE — Progress Notes (Signed)
Subjective:    Patient ID: [name], female    DOB: [birth year],    Past Medical History  Diagnosis Date  . DVT, lower extremity, recurrent (HCC)     Long-term Coumadin per Dr. Legrand Rams  . Type 2 diabetes mellitus ([place])   . Anxiety   . Depression     History of psychosis and previous suicide attempt  . Hemiplegia ([place])   . GERD (gastroesophageal reflux disease)   . History of stroke     Acute infarct and right cerebral white matter small vessel disease 12/10  . Arthritis   . Essential hypertension   . Schizophrenia ([place])   . Leg DVT (deep venous thromboembolism), acute ([place]) 2006  . Hemiplegia ([place]) 2010    left side  . Stroke [facility])    Past Surgical History  Procedure Laterality Date  . Back surgery    . Colonoscopy with propofol N/A 11/24/2014    Procedure: COLONOSCOPY WITH PROPOFOL;  Surgeon: Danie Binder, MD;  Location: [facility] OR[facility];  Service: Endoscopy;  Laterality: N/A;  in cecum @ 1030, out @ 1049  . Esophagogastroduodenoscopy (egd) with propofol N/A 11/24/2014    Procedure: ESOPHAGOGASTRODUODENOSCOPY (EGD) WITH PROPOFOL;  Surgeon: Danie Binder, MD;  Location: [facility];  Service: Endoscopy;  Laterality: N/A;  . Polypectomy N/A 11/24/2014    Procedure: POLYPECTOMY;  Surgeon: Danie Binder, MD;  Location: [facility];  Service: Endoscopy;  Laterality: N/A;  . Esophageal biopsy N/A 11/24/2014    Procedure: BIOPSY;  Surgeon: Danie Binder, MD;  Location: [facility];  Service: Endoscopy;  Laterality: N/A;  . Givens capsule study N/A 12/11/2014    Procedure: GIVENS CAPSULE STUDY;  Surgeon: Danie Binder, MD;  Location: [facility] SUITE;  Service: Endoscopy;  Laterality: N/A;  0730   Social History   Social History  . Marital Status: Widowed    Spouse Name: N/A  . Number of Children: N/A  . Years of Education: N/A   Social History Main Topics  . Smoking status: Never Smoker   . Smokeless tobacco: None  . Alcohol Use: No  . Drug Use: No  . Sexual Activity: No   Other Topics  Concern  . None   Social History Narrative   Outpatient Encounter Prescriptions as of 02/12/2015  Medication Sig  . canagliflozin (INVOKANA) 100 MG TABS tablet Take 100 mg by mouth daily.  Marland Kitchen LEVEMIR FLEXTOUCH 100 UNIT/ML Pen INJECT 100 UNITS SUBCUTANEOUSLY AT BEDTIME.  Marland Kitchen NOVOLOG FLEXPEN 100 UNIT/ML FlexPen INJECT 35 UNITS SUBCUTANEOUSLY BEFORE MEALS IF BS IS BETWEEN 90-150. 150-200 ADD 1 UNIT , 201-250 ADD 2 UNITS  . acetaminophen (TYLENOL) 325 MG tablet Take 650 mg by mouth every 6 (six) hours as needed. For fever > 101   . ARIPiprazole (ABILIFY) 2 MG tablet Take 1 tablet (2 mg total) by mouth daily.  . calcium carbonate (TUMS - DOSED IN MG ELEMENTAL CALCIUM) 500 MG chewable tablet Chew 1 tablet by mouth 4 (four) times daily as needed for indigestion or heartburn.   . clonazePAM (KLONOPIN) 0.5 MG tablet Take 0.5 mg by mouth 3 (three) times daily.   Marland Kitchen docusate sodium (COLACE) 100 MG capsule Take 100 mg by mouth at bedtime.  . DULoxetine (CYMBALTA) 60 MG capsule Take 120 mg by mouth daily.   . enalapril (VASOTEC) 5 MG tablet Take 2.5 mg by mouth every morning.   . enoxaparin (LOVENOX) 80 MG/0.8ML injection Inject 0.8 mLs (80 mg total) into the skin  every 12 (twelve) hours. FIRST DOSE NOV 1 AT 1800. CONTINUE FOR 3 DAYS. LAST DOSE NOV 3 AT 1800. (Patient not taking: Reported on 12/08/2014)  . ferrous sulfate (EQL SLOW RELEASE IRON) 160 (50 Fe) MG TBCR SR tablet Take 1 tablet (160 mg total) by mouth daily.  Marland Kitchen HYDROcodone-acetaminophen (NORCO) 5-325 MG per tablet Take 1 tablet by mouth 3 (three) times daily.   . Linaclotide (LINZESS) 290 MCG CAPS capsule Take 1 capsule (290 mcg total) by mouth daily. On an empty stomach, 30 minutes before breakfast  . loperamide (IMODIUM) 2 MG capsule Take 4 mg by mouth daily as needed for diarrhea or loose stools. Take 2 capsules initially then take 2 after each loose stool per MAR  . metoprolol succinate (TOPROL-XL) 25 MG 24 hr tablet Take 12.5 mg by mouth 2 (two)  times daily.   . mirtazapine (REMERON) 30 MG tablet Take 30 mg by mouth at bedtime.    Marland Kitchen omeprazole (PRILOSEC) 40 MG capsule Take 40 mg by mouth daily.  . Polysacch Fe Cmp-Fe Heme Poly (BIFERA) 28 MG TABS 1 po bid  . polyvinyl alcohol (ARTIFICIAL TEARS) 1.4 % ophthalmic solution Place 1 drop into both eyes 3 (three) times daily.  . risperiDONE (RISPERDAL) 1 MG tablet Take 1 mg by mouth at bedtime.  . simvastatin (ZOCOR) 20 MG tablet Take 20 mg by mouth at bedtime.  . sodium chloride (OCEAN) 0.65 % SOLN nasal spray Place 2 sprays into both nostrils 4 (four) times daily.  . solifenacin (VESICARE) 5 MG tablet Take 5 mg by mouth daily.  Marland Kitchen tiZANidine (ZANAFLEX) 2 MG tablet Take 2 mg by mouth daily.  . Vitamin D, Ergocalciferol, (DRISDOL) 50000 UNITS CAPS capsule Take 50,000 Units by mouth every 7 (seven) days. Takes on Fridays.  Marland Kitchen warfarin (COUMADIN) 5 MG tablet TAKE 1 TABLET BY MOUTH ONCE DAILY.  Marland Kitchen zolpidem (AMBIEN) 10 MG tablet Take 10 mg by mouth at bedtime.   No facility-administered encounter medications on file as of 02/12/2015.   ALLERGIES: Allergies  Allergen Reactions  . Sulfa Antibiotics Rash  . Sulfonamide Derivatives Rash    REACTION: rash   VACCINATION STATUS:  There is no immunization history on file for this patient.  Diabetes She presents for her follow-up diabetic visit. She has type 2 diabetes mellitus. Onset time: She was diagnosed at approximate age of 72 years. Her disease course has been stable. There are no hypoglycemic associated symptoms. Pertinent negatives for hypoglycemia include no confusion, pallor or seizures. Pertinent negatives for diabetes include no polydipsia, no polyphagia and no polyuria. There are no hypoglycemic complications. Symptoms are stable. Diabetic complications include a CVA. Risk factors for coronary artery disease include dyslipidemia, diabetes mellitus, obesity, sedentary lifestyle and hypertension. Current diabetic treatment includes  intensive insulin program and oral agent (monotherapy). Her weight is stable. She is following a generally unhealthy diet. When asked about meal planning, she reported none. She never participates in exercise. Her home blood glucose trend is decreasing steadily. Her breakfast blood glucose range is generally 140-180 mg/dl. Her lunch blood glucose range is generally 140-180 mg/dl. Her dinner blood glucose range is generally 140-180 mg/dl. Her overall blood glucose range is 140-180 mg/dl. An ACE inhibitor/angiotensin II receptor blocker is being taken.  Hypertension This is a chronic problem. The current episode started more than 1 year ago. The problem is controlled. Pertinent negatives include no palpitations or shortness of breath. Risk factors for coronary artery disease include dyslipidemia and diabetes mellitus. Hypertensive end-organ  damage includes CVA.  Hyperlipidemia This is a chronic problem. The current episode started more than 1 year ago. Exacerbating diseases include diabetes. Pertinent negatives include no shortness of breath. Risk factors for coronary artery disease include diabetes mellitus, dyslipidemia, hypertension, obesity and a sedentary lifestyle.     Review of Systems  Constitutional: Negative for unexpected weight change.       Uses walker to get around.   HENT: Negative for trouble swallowing and voice change.   Eyes: Negative for visual disturbance.  Respiratory: Negative for shortness of breath and wheezing.   Cardiovascular: Negative for palpitations and leg swelling.  Gastrointestinal: Negative for diarrhea.  Endocrine: Negative for cold intolerance, heat intolerance, polydipsia, polyphagia and polyuria.  Skin: Negative for color change, pallor and wound.  Neurological: Negative for seizures.  Psychiatric/Behavioral: Negative for suicidal ideas and confusion.    Objective:    BP 96/54 mmHg  Pulse 111  Ht 5\' 7"  (1.702 m)  Wt 172 lb (78.019 kg)  BMI 26.93 kg/m2   SpO2 98%  Wt Readings from Last 3 Encounters:  02/12/15 172 lb (78.019 kg)  12/10/14 172 lb (78.019 kg)  11/24/14 174 lb (78.926 kg)    Physical Exam  Constitutional: She is oriented to person, place, and time. She appears well-developed.  HENT:  Head: Normocephalic and atraumatic.  Eyes: EOM are normal.  Neck: Normal range of motion. Neck supple. No tracheal deviation present. No thyromegaly present.  Cardiovascular: Normal rate and regular rhythm.   Pulmonary/Chest: Effort normal and breath sounds normal.  Abdominal: Soft. Bowel sounds are normal. There is no tenderness. There is no guarding.  Musculoskeletal: Normal range of motion. She exhibits no edema.  Neurological: She is alert and oriented to person, place, and time. She has normal reflexes.  She uses walker to get around.  Skin: Skin is warm and dry. No rash noted. No erythema. No pallor.  Psychiatric: She has a normal mood and affect. Judgment normal.    Results for orders placed or performed in visit on [id number]  Basic metabolic panel  Result Value Ref Range   Sodium 137 135 - 146 mmol/L   Potassium 3.9 3.5 - 5.3 mmol/L   Chloride 98 98 - 110 mmol/L   CO2 29 20 - 31 mmol/L   Glucose, Bld 55 (L) 65 - 99 mg/dL   BUN 8 7 - 25 mg/dL   Creat 0.67 0.50 - 0.99 mg/dL   Calcium 9.0 8.6 - 10.4 mg/dL  Hemoglobin A1c  Result Value Ref Range   Hgb A1c MFr Bld 7.7 (H) <5.7 %   Mean Plasma Glucose 174 (H) <117 mg/dL   Complete Blood Count (Most recent): Lab Results  Component Value Date   WBC 9.1 11/19/2014   HGB 8.6* 11/19/2014   HCT 31.1* 11/19/2014   MCV 70.2* 11/19/2014   PLT 366 11/19/2014   Chemistry (most recent): Lab Results  Component Value Date   NA 137 02/08/2015   K 3.9 02/08/2015   CL 98 02/08/2015   CO2 29 02/08/2015   BUN 8 02/08/2015   CREATININE 0.67 02/08/2015   Diabetic Labs (most recent): Lab Results  Component Value Date   HGBA1C 7.7* 02/08/2015   HGBA1C 7.9* 10/29/2014   Lipid Panel      Component Value Date/Time   CHOL * 01/03/2009 0630    210        ATP III CLASSIFICATION:  <200     mg/dL   Desirable  200-239  mg/dL  Borderline High  >=240    mg/dL   High          TRIG 161* 01/03/2009 0630   HDL 35* 01/03/2009 0630   CHOLHDL 6.0 01/03/2009 0630   VLDL 32 01/03/2009 0630   LDLCALC * 01/03/2009 0630    143        Total Cholesterol/HDL:CHD Risk Coronary Heart Disease Risk Table                     Men   Women  1/2 Average Risk   3.4   3.3  Average Risk       5.0   4.4  2 X Average Risk   9.6   7.1  3 X Average Risk  23.4   11.0        Use the calculated Patient Ratio above and the CHD Risk Table to determine the patient's CHD Risk.        ATP III CLASSIFICATION (LDL):  <100     mg/dL   Optimal  100-129  mg/dL   Near or Above                    Optimal  130-159  mg/dL   Borderline  160-189  mg/dL   High  >190     mg/dL   Very High      Assessment & Plan:   1. Type 2 diabetes mellitus with vascular disease ([place]) Her diabetes is  complicated by recurrent CVA. Patient came with improved glucose profile, and  recent A1c  improved to 7.7 %.  Recent labs reviewed. - Patient remains at a high risk for more acute and chronic complications of diabetes which include CAD, CVA, CKD, retinopathy, and neuropathy. These are all discussed in detail with the patient.  - I have re-counseled the patient on diet management and weight loss  by adopting a carbohydrate restricted / protein rich  Diet. - Patient is advised to stick to a routine mealtimes to eat 3 meals  a day and avoid unnecessary snacks ( to snack only to correct hypoglycemia).  - Suggestion is made for patient to avoid simple carbohydrates   from their diet including Cakes , Desserts, Ice Cream,  Soda (  diet and regular) , Sweet Tea , Candies,  Chips, Cookies, Artificial Sweeteners,   and "Sugar-free" Products .  This will help patient to have stable blood glucose profile and potentially avoid  unintended  Weight gain.   - I have approached patient with the following individualized plan to manage diabetes and patient agrees.  - continue Levemir 100 units qhs, continue Novolog 35 units TIDAC for premeal BG readings of 90-150mg /dl, plus patient specific sliding scale insulin for correction of unexpected hyperglycemia above 150mg /dl, associated with strict monitoring of BG AC and HS.   -Adjustment parameters for hypo and hyperglycemia were given in a written document to patient. -Patient is encouraged to call clinic for blood glucose levels less than 70 or above 300 mg /dl.  -I will continue Invokana 100mg  po qday, she did not tolerate Metformin.  - Patient will be considered for incretin therapy as appropriate next visit. - Patient specific target  for A1c; LDL, HDL, Triglycerides, and  Waist Circumference were discussed in detail.  2) BP/HTN: Controlled. Continue current medications including ACEI. 3) Lipids/HPL: continue statins. 4)  Weight/Diet:  exercise, and carbohydrates information provided.  5) Chronic Care/Health Maintenance:  -Patient is  on ACEI and Statin medications  and encouraged to continue to follow up with Ophthalmology, Podiatrist at least yearly or according to recommendations, and advised to  stay away from smoking. I have recommended yearly flu vaccine and pneumonia vaccination at least every 5 years; and  sleep for at least 7 hours a day.  I advised patient to maintain close follow up with their PCP for primary care needs.  Patient is asked to bring meter and  blood glucose logs during their next visit.   Follow up plan: Return in about 3 months (around 05/13/2015) for diabetes, high blood pressure, high cholesterol, follow up with pre-visit labs, meter, and logs.  Glade Lloyd, MD Phone: [phone]  Fax: [phone]   02/12/2015, 9:39 AM

## 2015-02-18 DIAGNOSIS — F331 Major depressive disorder, recurrent, moderate: Secondary | ICD-10-CM | POA: Diagnosis not present

## 2015-02-22 DIAGNOSIS — E1165 Type 2 diabetes mellitus with hyperglycemia: Secondary | ICD-10-CM | POA: Diagnosis not present

## 2015-02-22 DIAGNOSIS — I10 Essential (primary) hypertension: Secondary | ICD-10-CM | POA: Diagnosis not present

## 2015-02-24 ENCOUNTER — Other Ambulatory Visit: Payer: Self-pay | Admitting: "Endocrinology

## 2015-02-25 ENCOUNTER — Other Ambulatory Visit: Payer: Self-pay | Admitting: "Endocrinology

## 2015-03-03 ENCOUNTER — Ambulatory Visit (INDEPENDENT_AMBULATORY_CARE_PROVIDER_SITE_OTHER): Payer: Medicare Other | Admitting: *Deleted

## 2015-03-03 DIAGNOSIS — Z5181 Encounter for therapeutic drug level monitoring: Secondary | ICD-10-CM

## 2015-03-03 DIAGNOSIS — I82409 Acute embolism and thrombosis of unspecified deep veins of unspecified lower extremity: Secondary | ICD-10-CM

## 2015-03-03 DIAGNOSIS — Z7901 Long term (current) use of anticoagulants: Secondary | ICD-10-CM

## 2015-03-03 DIAGNOSIS — Z8679 Personal history of other diseases of the circulatory system: Secondary | ICD-10-CM

## 2015-03-03 LAB — POCT INR: INR: 2.6

## 2015-03-09 ENCOUNTER — Other Ambulatory Visit: Payer: Self-pay | Admitting: Gastroenterology

## 2015-03-18 ENCOUNTER — Encounter: Payer: Self-pay | Admitting: Gastroenterology

## 2015-03-18 DIAGNOSIS — F331 Major depressive disorder, recurrent, moderate: Secondary | ICD-10-CM | POA: Diagnosis not present

## 2015-03-22 DIAGNOSIS — E1165 Type 2 diabetes mellitus with hyperglycemia: Secondary | ICD-10-CM | POA: Diagnosis not present

## 2015-03-22 DIAGNOSIS — J0100 Acute maxillary sinusitis, unspecified: Secondary | ICD-10-CM | POA: Diagnosis not present

## 2015-03-22 DIAGNOSIS — J302 Other seasonal allergic rhinitis: Secondary | ICD-10-CM | POA: Diagnosis not present

## 2015-03-22 DIAGNOSIS — I10 Essential (primary) hypertension: Secondary | ICD-10-CM | POA: Diagnosis not present

## 2015-03-26 ENCOUNTER — Encounter: Payer: Medicare Other | Admitting: Adult Health

## 2015-03-26 NOTE — Progress Notes (Signed)
Cardiology Office Note   Date:  03/26/2015   ID:  [name], DOB 03/25/1945, MRN [id number]  NO SHOW

## 2015-03-31 ENCOUNTER — Ambulatory Visit (INDEPENDENT_AMBULATORY_CARE_PROVIDER_SITE_OTHER): Payer: Medicare Other | Admitting: Physician Assistant

## 2015-03-31 ENCOUNTER — Ambulatory Visit (INDEPENDENT_AMBULATORY_CARE_PROVIDER_SITE_OTHER): Payer: Medicare Other | Admitting: Pharmacist

## 2015-03-31 ENCOUNTER — Encounter: Payer: Self-pay | Admitting: Physician Assistant

## 2015-03-31 VITALS — BP 110/56 | HR 95 | Ht 66.0 in | Wt 169.0 lb

## 2015-03-31 DIAGNOSIS — Z7901 Long term (current) use of anticoagulants: Secondary | ICD-10-CM

## 2015-03-31 DIAGNOSIS — Z8719 Personal history of other diseases of the digestive system: Secondary | ICD-10-CM | POA: Diagnosis not present

## 2015-03-31 DIAGNOSIS — I82402 Acute embolism and thrombosis of unspecified deep veins of left lower extremity: Secondary | ICD-10-CM | POA: Diagnosis not present

## 2015-03-31 DIAGNOSIS — Z8679 Personal history of other diseases of the circulatory system: Secondary | ICD-10-CM | POA: Diagnosis not present

## 2015-03-31 DIAGNOSIS — I10 Essential (primary) hypertension: Secondary | ICD-10-CM

## 2015-03-31 DIAGNOSIS — Z5181 Encounter for therapeutic drug level monitoring: Secondary | ICD-10-CM

## 2015-03-31 DIAGNOSIS — I82409 Acute embolism and thrombosis of unspecified deep veins of unspecified lower extremity: Secondary | ICD-10-CM

## 2015-03-31 LAB — POCT INR: INR: 2.8

## 2015-03-31 NOTE — Assessment & Plan Note (Signed)
Patient is on chronic Coumadin and INRs have been stable. She did have an anemia in November with hemoglobin down to 8.6. She is now on iron. GI workup done. Will follow-up CBC today.

## 2015-03-31 NOTE — Patient Instructions (Signed)
Your physician wants you to follow-up in: 1 Year with Dr. Domenic Polite. You will receive a reminder letter in the mail two months in advance. If you don't receive a letter, please call our office to schedule the follow-up appointment.  Your physician recommends that you continue on your current medications as directed. Please refer to the Current Medication list given to you today.  If you need a refill on your cardiac medications before your next appointment, please call your pharmacy.  Your physician recommends that you return for lab work.  Thank you for choosing [facility]!

## 2015-03-31 NOTE — Assessment & Plan Note (Signed)
Blood pressure controlled on low-dose enalapril and metoprolol

## 2015-03-31 NOTE — Progress Notes (Signed)
Cardiology Office Note   Date:  03/31/2015   ID:  [name], DOB Jun 09, 1945, MRN [id number]  PCP:  [name], MD  Cardiologist:  Dr. Domenic Polite   Chief Complaint: Yearly follow-up    History of Present Illness: [name] is a 70 y.o. female who presents for yearly follow-up. She is followed in our Coumadin clinic for recurrent DVT and prior stroke with left-sided residual. She resides at [facility] home. She has hypertension, abnormal EKG which is a chronic finding with no history of MI or chest pain. 2-D echo in 2010 showed normal LV function EF 60-65%. An 11/2014 she had iron deficiency anemia due to erosive gastropathy/enteropathy. Endoscopy showed nonerosive gastritis that was mild and not a source of bleed and colonoscopy she had some rectal bleeding from moderate sized hemorrhoids and 3 polyps were removed. She was placed on iron.  She is here today by herself. She denies any cardiac complaints. No chest pain, palpitations, dyspnea, dyspnea on exertion, or presyncope. She says she's had blood in her stools in the past due to constipation but nothing recently. She has chronic left leg edema where her DVTs were. She says she'll get dizzy if she tries to rush to fast but otherwise she has no cardiac symptoms.    Past Medical History  Diagnosis Date  . DVT, lower extremity, recurrent (HCC)     Long-term Coumadin per Dr. Legrand Rams  . Type 2 diabetes mellitus ([place])   . Anxiety   . Depression     History of psychosis and previous suicide attempt  . Hemiplegia ([place])   . GERD (gastroesophageal reflux disease)   . History of stroke     Acute infarct and right cerebral white matter small vessel disease 12/10  . Arthritis   . Essential hypertension   . Schizophrenia ([place])   . Leg DVT (deep venous thromboembolism), acute ([place]) 2006  . Hemiplegia ([place]) 2010    left side  . Stroke [facility])     Past Surgical History  Procedure Laterality Date  . Back surgery    .  Colonoscopy with propofol N/A 11/24/2014    Procedure: COLONOSCOPY WITH PROPOFOL;  Surgeon: Danie Binder, MD;  Location: [facility] ORS;  Service: Endoscopy;  Laterality: N/A;  in cecum @ 1030, out @ 1049  . Esophagogastroduodenoscopy (egd) with propofol N/A 11/24/2014    Procedure: ESOPHAGOGASTRODUODENOSCOPY (EGD) WITH PROPOFOL;  Surgeon: Danie Binder, MD;  Location: [facility] ORS;  Service: Endoscopy;  Laterality: N/A;  . Polypectomy N/A 11/24/2014    Procedure: POLYPECTOMY;  Surgeon: Danie Binder, MD;  Location: [facility] ORS;  Service: Endoscopy;  Laterality: N/A;  . Biopsy N/A 11/24/2014    Procedure: BIOPSY;  Surgeon: Danie Binder, MD;  Location: [facility] ORS;  Service: Endoscopy;  Laterality: N/A;  . Givens capsule study N/A 12/11/2014    Procedure: GIVENS CAPSULE STUDY;  Surgeon: Danie Binder, MD;  Location: [facility] ENDO SUITE;  Service: Endoscopy;  Laterality: N/A;  0730     Current Outpatient Prescriptions  Medication Sig Dispense Refill  . acetaminophen (TYLENOL) 325 MG tablet Take 650 mg by mouth every 6 (six) hours as needed. For fever > 101     . calcium carbonate (TUMS - DOSED IN MG ELEMENTAL CALCIUM) 500 MG chewable tablet Chew 1 tablet by mouth 4 (four) times daily as needed for indigestion or heartburn.     . canagliflozin (INVOKANA) 100 MG TABS tablet Take 100 mg by mouth daily.    Marland Kitchen  clonazePAM (KLONOPIN) 0.5 MG tablet Take 0.5 mg by mouth 3 (three) times daily.     Marland Kitchen docusate sodium (COLACE) 100 MG capsule Take 100 mg by mouth at bedtime.    . DULoxetine (CYMBALTA) 60 MG capsule Take 120 mg by mouth daily.     . enalapril (VASOTEC) 5 MG tablet Take 2.5 mg by mouth every morning.     . ferrous sulfate (EQL SLOW RELEASE IRON) 160 (50 Fe) MG TBCR SR tablet Take 1 tablet (160 mg total) by mouth daily. 30 each 11  . HYDROcodone-acetaminophen (NORCO) 5-325 MG per tablet Take 1 tablet by mouth 3 (three) times daily.     Marland Kitchen LEVEMIR FLEXTOUCH 100 UNIT/ML Pen INJECT 100 UNITS SUBCUTANEOUSLY AT BEDTIME. 30 mL 2   . LINZESS 290 MCG CAPS capsule TAKE 1 CAPSULE BY MOUTH EACH MORNING 30 MINUTES BEFORE BREAKFAST. 30 capsule 11  . loperamide (IMODIUM) 2 MG capsule Take 4 mg by mouth daily as needed for diarrhea or loose stools. Take 2 capsules initially then take 2 after each loose stool per MAR    . metoprolol succinate (TOPROL-XL) 25 MG 24 hr tablet Take 12.5 mg by mouth 2 (two) times daily.     . mirtazapine (REMERON) 30 MG tablet Take 30 mg by mouth at bedtime.      Marland Kitchen NOVOLOG FLEXPEN 100 UNIT/ML FlexPen INJECT 35 UNITS SUBCUTANEOUSLY BEFORE MEALS IF BS IS BETWEEN 90-150. 150-200 ADD 1 UNIT , 201-250 ADD 2 UNITS 15 mL 2  . NOVOLOG FLEXPEN 100 UNIT/ML FlexPen SS: 251-300 ADD 3 UNITS; 301-350 ADD 4 UNITS; 351-400 ADD 5 UNITS; 401-450 ADD 6 UNITS AND CALL DOCTOR. 15 mL 2  . omeprazole (PRILOSEC) 40 MG capsule Take 40 mg by mouth daily.    . Polysacch Fe Cmp-Fe Heme Poly (BIFERA) 28 MG TABS 1 po bid 60 tablet 11  . polyvinyl alcohol (ARTIFICIAL TEARS) 1.4 % ophthalmic solution Place 1 drop into both eyes 3 (three) times daily.    . risperiDONE (RISPERDAL) 1 MG tablet Take 1 mg by mouth at bedtime.    . simvastatin (ZOCOR) 20 MG tablet Take 20 mg by mouth at bedtime.    . sodium chloride (OCEAN) 0.65 % SOLN nasal spray Place 2 sprays into both nostrils 4 (four) times daily.    . solifenacin (VESICARE) 5 MG tablet Take 5 mg by mouth daily.    Marland Kitchen tiZANidine (ZANAFLEX) 2 MG tablet Take 2 mg by mouth daily.    . Vitamin D, Ergocalciferol, (DRISDOL) 50000 UNITS CAPS capsule Take 50,000 Units by mouth every 7 (seven) days. Takes on Fridays.    Marland Kitchen warfarin (COUMADIN) 5 MG tablet TAKE 1 TABLET BY MOUTH ONCE DAILY. 30 tablet 6  . zolpidem (AMBIEN) 10 MG tablet Take 10 mg by mouth at bedtime.    . ARIPiprazole (ABILIFY) 2 MG tablet Take 1 tablet (2 mg total) by mouth daily. 30 tablet 0  . NOVOFINE AUTOCOVER 30G X 8 MM MISC      No current facility-administered medications for this visit.    Allergies:   Sulfa  antibiotics and Sulfonamide derivatives    Social History:  The patient  reports that she quit smoking about 20 years ago. She does not have any smokeless tobacco history on file. She reports that she does not drink alcohol or use illicit drugs.   Family History:  The patient's    family history is negative for Colon cancer.    ROS:  Please see the history  of present illness.   Otherwise, review of systems are positive for none.   All other systems are reviewed and negative.    PHYSICAL EXAM: VS:  Ht 5\' 6"  (1.676 m)  Wt 169 lb (76.658 kg)  BMI 27.29 kg/m2 , BMI Body mass index is 27.29 kg/(m^2). GEN: Well nourished, well developed, in no acute distress Neck: no JVD, HJR, carotid bruits, or masses Cardiac: RRR; 1/6 systolic murmur at the left sternal border, no gallop, rubs, thrill or heave,  Respiratory:  clear to auscultation bilaterally, normal work of breathing GI: soft, nontender, nondistended, + BS MS: no deformity or atrophy Extremities: Trace of edema on the left leg otherwise without cyanosis, clubbing, edema, good distal pulses bilaterally.  Skin: warm and dry, no rash Neuro:  Strength and sensation are intact    EKG:  EKG is ordered today. The ekg ordered today demonstrates normal sinus rhythm with inferior Q waves, nonspecific ST-T wave changes, unchanged from prior EKGs   Recent Labs: 09/14/2014: ALT 11* 11/19/2014: Hemoglobin 8.6*; Platelets 366 02/08/2015: BUN 8; Creat 0.67; Potassium 3.9; Sodium 137    Lipid Panel    Component Value Date/Time   CHOL * 01/03/2009 0630    210        ATP III CLASSIFICATION:  <200     mg/dL   Desirable  200-239  mg/dL   Borderline High  >=240    mg/dL   High          TRIG 161* 01/03/2009 0630   HDL 35* 01/03/2009 0630   CHOLHDL 6.0 01/03/2009 0630   VLDL 32 01/03/2009 0630   LDLCALC * 01/03/2009 0630    143        Total Cholesterol/HDL:CHD Risk Coronary Heart Disease Risk Table                     Men   Women  1/2  Average Risk   3.4   3.3  Average Risk       5.0   4.4  2 X Average Risk   9.6   7.1  3 X Average Risk  23.4   11.0        Use the calculated Patient Ratio above and the CHD Risk Table to determine the patient's CHD Risk.        ATP III CLASSIFICATION (LDL):  <100     mg/dL   Optimal  100-129  mg/dL   Near or Above                    Optimal  130-159  mg/dL   Borderline  160-189  mg/dL   High  >190     mg/dL   Very High      Wt Readings from Last 3 Encounters:  03/31/15 169 lb (76.658 kg)  02/12/15 172 lb (78.019 kg)  12/10/14 172 lb (78.019 kg)      Other studies Reviewed: Additional studies/ records that were reviewed today include and review of the records demonstrates:   Echocardiogram 01/04/2009: Study Conclusions   - Left ventricle: The cavity size was normal. Wall thickness was    increased in a pattern of moderate LVH. Systolic function was    normal. The estimated ejection fraction was in the range of 60% to    65%. Wall motion was normal; there were no regional wall motion    abnormalities.  - Aortic valve: Moderately calcified annulus. Trileaflet; mildly    calcified leaflets.  -  Mitral valve: Mildly to moderately calcified annulus.  - Right ventricle: The cavity size was normal. Wall thickness was    mildly increased.  - Pericardium, extracardiac: A trivial pericardial effusion was    identified.  ASSESSMENT AND PLAN:  Chronic anticoagulation Patient is on chronic Coumadin and INRs have been stable. She did have an anemia in November with hemoglobin down to 8.6. She is now on iron. GI workup done. Will follow-up CBC today.  ESSENTIAL HYPERTENSION, BENIGN Blood pressure controlled on low-dose enalapril and metoprolol  DVT, lower extremity, recurrent Maintained on coumadin    Sumner Boast, PA-C  03/31/2015 10:19 AM    [facility] [address], [place], [place]  [address] Phone: [phone]; Fax: [phone]

## 2015-03-31 NOTE — Assessment & Plan Note (Signed)
Maintained on coumadin.  

## 2015-04-06 ENCOUNTER — Encounter: Payer: Self-pay | Admitting: Gastroenterology

## 2015-04-06 ENCOUNTER — Other Ambulatory Visit: Payer: Self-pay | Admitting: Gastroenterology

## 2015-04-06 ENCOUNTER — Ambulatory Visit (INDEPENDENT_AMBULATORY_CARE_PROVIDER_SITE_OTHER): Payer: Medicare Other | Admitting: Gastroenterology

## 2015-04-06 VITALS — BP 93/52 | HR 109 | Temp 96.3°F | Ht 66.0 in | Wt 170.2 lb

## 2015-04-06 DIAGNOSIS — D509 Iron deficiency anemia, unspecified: Secondary | ICD-10-CM | POA: Diagnosis not present

## 2015-04-06 LAB — CBC
HCT: 38.7 % (ref 36.0–46.0)
Hemoglobin: 12.2 g/dL (ref 12.0–15.0)
MCH: 24.6 pg — ABNORMAL LOW (ref 26.0–34.0)
MCHC: 31.5 g/dL (ref 30.0–36.0)
MCV: 78 fL (ref 78.0–100.0)
Platelets: 320 10*3/uL (ref 150–400)
RBC: 4.96 MIL/uL (ref 3.87–5.11)
RDW: 23.9 % — ABNORMAL HIGH (ref 11.5–15.5)
WBC: 9.3 10*3/uL (ref 4.0–10.5)

## 2015-04-06 LAB — IRON AND TIBC
%SAT: 50 % (ref 11–50)
Iron: 171 ug/dL — ABNORMAL HIGH (ref 45–160)
TIBC: 341 ug/dL (ref 250–450)
UIBC: 170 ug/dL (ref 125–400)

## 2015-04-06 LAB — FERRITIN: Ferritin: 14 ng/mL — ABNORMAL LOW (ref 20–288)

## 2015-04-06 NOTE — Patient Instructions (Signed)
Please have blood work done.  We will decide if you need to be seen by the Hematologist or not after review of blood work.  We will see you in 6 months!

## 2015-04-06 NOTE — Progress Notes (Signed)
cc'ed to pcp °

## 2015-04-06 NOTE — Assessment & Plan Note (Signed)
Secondary to erosive gastropathy and enteropathy. Thoroughly evaluated. Now on supplemental iron. No overt GI bleeding or concerning GI symptoms. Recheck CBC, iron studies now. May need hematology referral for IV iron. 6 month return regardless.

## 2015-04-06 NOTE — Progress Notes (Signed)
Referring Provider: [name], MD Primary Care Physician:  [name], MD  Primary GI: Dr. Oneida Alar   Chief Complaint  Patient presents with  . Follow-up    HPI:   [name] is a 70 y.o. female presenting today with a history of IDA. Colonoscopy/EGD, capsule study completed. IDA felt to be related to erosive gastropathy and enteropathy. Prescribed iron. Needs repeat CBC, iron, ferritin now. May need Hematology consultation. No evidence of celiac sprue. Next colonoscopy due in 3 years.   Sometimes has abdominal discomfort with bowel movement. No constipation. No diarrhea. Appetite is good. No nausea or vomiting. Has occasional reflux exacerbation. Prilosec 40 mg once daily. Feels like food gets hung up at times, specifically rice, broccoli. Doesn't eat meat. Feels better, "not as weak as I was".   Past Medical History  Diagnosis Date  . DVT, lower extremity, recurrent (HCC)     Long-term Coumadin per Dr. Legrand Rams  . Type 2 diabetes mellitus ([place])   . Anxiety   . Depression     History of psychosis and previous suicide attempt  . Hemiplegia ([place])   . GERD (gastroesophageal reflux disease)   . History of stroke     Acute infarct and right cerebral white matter small vessel disease 12/10  . Arthritis   . Essential hypertension   . Schizophrenia ([place])   . Leg DVT (deep venous thromboembolism), acute ([place]) 2006  . Hemiplegia ([place]) 2010    left side  . Stroke [facility])     Past Surgical History  Procedure Laterality Date  . Back surgery    . Colonoscopy with propofol N/A 11/24/2014    Dr. Rudie Meyer polyps removed/moderate sized internal hemorrhoids, tubular adenomas. Next surveillance in 3 years  . Esophagogastroduodenoscopy (egd) with propofol N/A 11/24/2014    Dr. Clayburn Pert HH/patent stricture at the gastroesophageal junction, mild non-erosive gastritis, path negative for H.pylori or celiac sprue  . Polypectomy N/A 11/24/2014    Procedure: POLYPECTOMY;  Surgeon: Danie Binder, MD;  Location: [facility] OR[facility];  Service: Endoscopy;  Laterality: N/A;  . Biopsy N/A 11/24/2014    Procedure: BIOPSY;  Surgeon: Danie Binder, MD;  Location: [facility] ORS;  Service: Endoscopy;  Laterality: N/A;  . Givens capsule study N/A 12/11/2014    MULTILPLE EROSION IN the stomach WITH ACTIVE OOZING. OCCASIONAL EROSIONS AND RARE ULCER SEEN IN PROXIMAL SMALL BOWEL . No masses or AVMs SEEN. NO OLD BLOOD OR FRESH BLOOD SEEN.     Current Outpatient Prescriptions  Medication Sig Dispense Refill  . acetaminophen (TYLENOL) 325 MG tablet Take 650 mg by mouth every 6 (six) hours as needed. For fever > 101     . calcium carbonate (TUMS - DOSED IN MG ELEMENTAL CALCIUM) 500 MG chewable tablet Chew 1 tablet by mouth 4 (four) times daily as needed for indigestion or heartburn.     . canagliflozin (INVOKANA) 100 MG TABS tablet Take 100 mg by mouth daily.    . clonazePAM (KLONOPIN) 0.5 MG tablet Take 0.5 mg by mouth 3 (three) times daily.     Marland Kitchen docusate sodium (COLACE) 100 MG capsule Take 100 mg by mouth at bedtime.    . DULoxetine (CYMBALTA) 60 MG capsule Take 120 mg by mouth daily.     . enalapril (VASOTEC) 5 MG tablet Take 2.5 mg by mouth every morning.     . ferrous sulfate (EQL SLOW RELEASE IRON) 160 (50 Fe) MG TBCR SR tablet Take 1 tablet (160 mg total) by mouth  daily. 30 each 11  . HYDROcodone-acetaminophen (NORCO) 5-325 MG per tablet Take 1 tablet by mouth 3 (three) times daily.     Marland Kitchen LEVEMIR FLEXTOUCH 100 UNIT/ML Pen INJECT 100 UNITS SUBCUTANEOUSLY AT BEDTIME. 30 mL 2  . LINZESS 290 MCG CAPS capsule TAKE 1 CAPSULE BY MOUTH EACH MORNING 30 MINUTES BEFORE BREAKFAST. 30 capsule 11  . loperamide (IMODIUM) 2 MG capsule Take 4 mg by mouth daily as needed for diarrhea or loose stools. Take 2 capsules initially then take 2 after each loose stool per MAR    . metoprolol succinate (TOPROL-XL) 25 MG 24 hr tablet Take 12.5 mg by mouth 2 (two) times daily.     . mirtazapine (REMERON) 30 MG tablet Take 30 mg by  mouth at bedtime.      Marland Kitchen NOVOFINE AUTOCOVER 30G X 8 MM MISC     . NOVOLOG FLEXPEN 100 UNIT/ML FlexPen INJECT 35 UNITS SUBCUTANEOUSLY BEFORE MEALS IF BS IS BETWEEN 90-150. 150-200 ADD 1 UNIT , 201-250 ADD 2 UNITS 15 mL 2  . NOVOLOG FLEXPEN 100 UNIT/ML FlexPen SS: 251-300 ADD 3 UNITS; 301-350 ADD 4 UNITS; 351-400 ADD 5 UNITS; 401-450 ADD 6 UNITS AND CALL DOCTOR. 15 mL 2  . omeprazole (PRILOSEC) 40 MG capsule Take 40 mg by mouth daily.    . Polysacch Fe Cmp-Fe Heme Poly (BIFERA) 28 MG TABS 1 po bid 60 tablet 11  . polyvinyl alcohol (ARTIFICIAL TEARS) 1.4 % ophthalmic solution Place 1 drop into both eyes 3 (three) times daily.    . risperiDONE (RISPERDAL) 1 MG tablet Take 1 mg by mouth at bedtime.    . simvastatin (ZOCOR) 20 MG tablet Take 20 mg by mouth at bedtime.    . sodium chloride (OCEAN) 0.65 % SOLN nasal spray Place 2 sprays into both nostrils 4 (four) times daily.    . solifenacin (VESICARE) 5 MG tablet Take 5 mg by mouth daily.    Marland Kitchen tiZANidine (ZANAFLEX) 2 MG tablet Take 2 mg by mouth daily.    . Vitamin D, Ergocalciferol, (DRISDOL) 50000 UNITS CAPS capsule Take 50,000 Units by mouth every 7 (seven) days. Takes on Fridays.    Marland Kitchen warfarin (COUMADIN) 5 MG tablet TAKE 1 TABLET BY MOUTH ONCE DAILY. 30 tablet 6  . zolpidem (AMBIEN) 10 MG tablet Take 10 mg by mouth at bedtime.    . ARIPiprazole (ABILIFY) 2 MG tablet Take 1 tablet (2 mg total) by mouth daily. 30 tablet 0   No current facility-administered medications for this visit.    Allergies as of 04/06/2015 - Review Complete 04/06/2015  Allergen Reaction Noted  . Sulfa antibiotics Rash 02/23/2010  . Sulfonamide derivatives Rash     Family History  Problem Relation Age of Onset  . Colon cancer Neg Hx     Social History   Social History  . Marital Status: Widowed    Spouse Name: N/A  . Number of Children: N/A  . Years of Education: N/A   Social History Main Topics  . Smoking status: Former Smoker    Quit date: 01/24/1995  .  Smokeless tobacco: None  . Alcohol Use: No  . Drug Use: No  . Sexual Activity: No   Other Topics Concern  . None   Social History Narrative    Review of Systems: As mentioned in HPI   Physical Exam: BP 93/52 mmHg  Pulse 109  Temp(Src) 96.3 F (35.7 C)  Ht 5\' 6"  (1.676 m)  Wt 170 lb 3.2 oz (77.202  kg)  BMI 27.48 kg/m2 General:   Alert and oriented. No distress noted. Pleasant and cooperative.  Head:  Normocephalic and atraumatic. Eyes:  Conjuctiva clear without scleral icterus. Mouth:  Oral mucosa pink and moist. Good dentition. No lesions. Heart:  S1, S2 present with soft systolic murmur  Abdomen:  +BS, soft, non-tender and non-distended. No rebound or guarding. No HSM or masses noted. Extremities:  Without edema. Neurologic:  Alert and  oriented x4 Psych:  Alert and cooperative. Normal mood and affect.  Lab Results  Component Value Date   WBC 9.1 11/19/2014   HGB 8.6* 11/19/2014   HCT 31.1* 11/19/2014   MCV 70.2* 11/19/2014   PLT 366 11/19/2014   Lab Results  Component Value Date   IRON 10* 10/21/2014   TIBC 418 10/21/2014   FERRITIN 6* 10/21/2014

## 2015-04-16 ENCOUNTER — Other Ambulatory Visit: Payer: Self-pay | Admitting: "Endocrinology

## 2015-04-19 DIAGNOSIS — I10 Essential (primary) hypertension: Secondary | ICD-10-CM | POA: Diagnosis not present

## 2015-04-19 DIAGNOSIS — E1165 Type 2 diabetes mellitus with hyperglycemia: Secondary | ICD-10-CM | POA: Diagnosis not present

## 2015-04-27 ENCOUNTER — Other Ambulatory Visit: Payer: Self-pay

## 2015-04-27 DIAGNOSIS — D509 Iron deficiency anemia, unspecified: Secondary | ICD-10-CM

## 2015-04-27 NOTE — Progress Notes (Signed)
Quick Note:  Let's refer to hematology due to IDA. Has been on oral iron. Ferritin only slightly improved. Looks like serum iron elevated. ______

## 2015-04-27 NOTE — Progress Notes (Signed)
Quick Note:  Called [place] and Butch Penny was unavailable. I spoke to [place] and she will let Butch Penny know that pt will be referred to Hematology. ______

## 2015-05-05 ENCOUNTER — Ambulatory Visit (INDEPENDENT_AMBULATORY_CARE_PROVIDER_SITE_OTHER): Payer: Medicare Other | Admitting: *Deleted

## 2015-05-05 DIAGNOSIS — I82409 Acute embolism and thrombosis of unspecified deep veins of unspecified lower extremity: Secondary | ICD-10-CM | POA: Diagnosis not present

## 2015-05-05 DIAGNOSIS — Z7901 Long term (current) use of anticoagulants: Secondary | ICD-10-CM

## 2015-05-05 DIAGNOSIS — Z8679 Personal history of other diseases of the circulatory system: Secondary | ICD-10-CM

## 2015-05-05 DIAGNOSIS — Z5181 Encounter for therapeutic drug level monitoring: Secondary | ICD-10-CM | POA: Diagnosis not present

## 2015-05-05 LAB — POCT INR: INR: 2.7

## 2015-05-06 DIAGNOSIS — F331 Major depressive disorder, recurrent, moderate: Secondary | ICD-10-CM | POA: Diagnosis not present

## 2015-05-12 ENCOUNTER — Other Ambulatory Visit: Payer: Self-pay | Admitting: "Endocrinology

## 2015-05-12 DIAGNOSIS — Z8719 Personal history of other diseases of the digestive system: Secondary | ICD-10-CM | POA: Diagnosis not present

## 2015-05-12 DIAGNOSIS — E1159 Type 2 diabetes mellitus with other circulatory complications: Secondary | ICD-10-CM | POA: Diagnosis not present

## 2015-05-12 LAB — CBC WITH DIFFERENTIAL/PLATELET
Basophils Absolute: 0 cells/uL (ref 0–200)
Basophils Relative: 0 %
Eosinophils Absolute: 219 cells/uL (ref 15–500)
Eosinophils Relative: 3 %
HCT: 39.5 % (ref 35.0–45.0)
Hemoglobin: 12.9 g/dL (ref 11.7–15.5)
Lymphocytes Relative: 29 %
Lymphs Abs: 2117 cells/uL (ref 850–3900)
MCH: 27.2 pg (ref 27.0–33.0)
MCHC: 32.7 g/dL (ref 32.0–36.0)
MCV: 83.3 fL (ref 80.0–100.0)
MPV: 9.8 fL (ref 7.5–12.5)
Monocytes Absolute: 511 cells/uL (ref 200–950)
Monocytes Relative: 7 %
Neutro Abs: 4453 cells/uL (ref 1500–7800)
Neutrophils Relative %: 61 %
Platelets: 254 10*3/uL (ref 140–400)
RBC: 4.74 MIL/uL (ref 3.80–5.10)
RDW: 18.7 % — ABNORMAL HIGH (ref 11.0–15.0)
WBC: 7.3 10*3/uL (ref 3.8–10.8)

## 2015-05-12 LAB — HEMOGLOBIN A1C
Hgb A1c MFr Bld: 8.2 % — ABNORMAL HIGH (ref <5.7)
Mean Plasma Glucose: 189 mg/dL

## 2015-05-13 LAB — BASIC METABOLIC PANEL
BUN: 11 mg/dL (ref 7–25)
CO2: 28 mmol/L (ref 20–31)
Calcium: 8.8 mg/dL (ref 8.6–10.4)
Chloride: 100 mmol/L (ref 98–110)
Creat: 0.67 mg/dL (ref 0.50–0.99)
Glucose, Bld: 167 mg/dL — ABNORMAL HIGH (ref 65–99)
Potassium: 4.4 mmol/L (ref 3.5–5.3)
Sodium: 139 mmol/L (ref 135–146)

## 2015-05-19 ENCOUNTER — Ambulatory Visit (INDEPENDENT_AMBULATORY_CARE_PROVIDER_SITE_OTHER): Payer: Medicare Other | Admitting: "Endocrinology

## 2015-05-19 ENCOUNTER — Other Ambulatory Visit: Payer: Self-pay

## 2015-05-19 ENCOUNTER — Encounter: Payer: Self-pay | Admitting: "Endocrinology

## 2015-05-19 VITALS — BP 91/54 | HR 111 | Ht 66.0 in | Wt 172.0 lb

## 2015-05-19 DIAGNOSIS — I10 Essential (primary) hypertension: Secondary | ICD-10-CM

## 2015-05-19 DIAGNOSIS — E785 Hyperlipidemia, unspecified: Secondary | ICD-10-CM

## 2015-05-19 DIAGNOSIS — E6609 Other obesity due to excess calories: Secondary | ICD-10-CM | POA: Diagnosis not present

## 2015-05-19 DIAGNOSIS — D649 Anemia, unspecified: Secondary | ICD-10-CM

## 2015-05-19 DIAGNOSIS — E1159 Type 2 diabetes mellitus with other circulatory complications: Secondary | ICD-10-CM

## 2015-05-19 NOTE — Progress Notes (Signed)
Subjective:    Patient ID: [name], female    DOB: 19-Sep-1945,    Past Medical History  Diagnosis Date  . DVT, lower extremity, recurrent (HCC)     Long-term Coumadin per Dr. Legrand Rams  . Type 2 diabetes mellitus ([place])   . Anxiety   . Depression     History of psychosis and previous suicide attempt  . Hemiplegia ([place])   . GERD (gastroesophageal reflux disease)   . History of stroke     Acute infarct and right cerebral white matter small vessel disease 12/10  . Arthritis   . Essential hypertension   . Schizophrenia ([place])   . Leg DVT (deep venous thromboembolism), acute ([place]) 2006  . Hemiplegia ([place]) 2010    left side  . Stroke [facility])    Past Surgical History  Procedure Laterality Date  . Back surgery    . Colonoscopy with propofol N/A 11/24/2014    Dr. Rudie Meyer polyps removed/moderate sized internal hemorrhoids, tubular adenomas. Next surveillance in 3 years  . Esophagogastroduodenoscopy (egd) with propofol N/A 11/24/2014    Dr. Clayburn Pert HH/patent stricture at the gastroesophageal junction, mild non-erosive gastritis, path negative for H.pylori or celiac sprue  . Polypectomy N/A 11/24/2014    Procedure: POLYPECTOMY;  Surgeon: Danie Binder, MD;  Location: [facility];  Service: Endoscopy;  Laterality: N/A;  . Biopsy N/A 11/24/2014    Procedure: BIOPSY;  Surgeon: Danie Binder, MD;  Location: [facility];  Service: Endoscopy;  Laterality: N/A;  . Givens capsule study N/A 12/11/2014    MULTILPLE EROSION IN the stomach WITH ACTIVE OOZING. OCCASIONAL EROSIONS AND RARE ULCER SEEN IN PROXIMAL SMALL BOWEL . No masses or AVMs SEEN. NO OLD BLOOD OR FRESH BLOOD SEEN.    Social History   Social History  . Marital Status: Widowed    Spouse Name: N/A  . Number of Children: N/A  . Years of Education: N/A   Social History Main Topics  . Smoking status: Former Smoker    Quit date: 01/24/1995  . Smokeless tobacco: None  . Alcohol Use: No  . Drug Use: No  . Sexual Activity: No    Other Topics Concern  . None   Social History Narrative   Outpatient Encounter Prescriptions as of 05/19/2015  Medication Sig  . acetaminophen (TYLENOL) 325 MG tablet Take 650 mg by mouth every 6 (six) hours as needed. For fever > 101   . ARIPiprazole (ABILIFY) 2 MG tablet Take 1 tablet (2 mg total) by mouth daily.  . calcium carbonate (TUMS - DOSED IN MG ELEMENTAL CALCIUM) 500 MG chewable tablet Chew 1 tablet by mouth 4 (four) times daily as needed for indigestion or heartburn.   . canagliflozin (INVOKANA) 100 MG TABS tablet Take 100 mg by mouth daily.  . clonazePAM (KLONOPIN) 0.5 MG tablet Take 0.5 mg by mouth 3 (three) times daily.   Marland Kitchen docusate sodium (COLACE) 100 MG capsule Take 100 mg by mouth at bedtime.  . DULoxetine (CYMBALTA) 60 MG capsule Take 120 mg by mouth daily.   . enalapril (VASOTEC) 5 MG tablet Take 2.5 mg by mouth every morning.   . ferrous sulfate (EQL SLOW RELEASE IRON) 160 (50 Fe) MG TBCR SR tablet Take 1 tablet (160 mg total) by mouth daily.  Marland Kitchen HYDROcodone-acetaminophen (NORCO) 5-325 MG per tablet Take 1 tablet by mouth 3 (three) times daily.   Marland Kitchen LEVEMIR FLEXTOUCH 100 UNIT/ML Pen INJECT 100 UNITS SUBCUTANEOUSLY AT BEDTIME.  Marland Kitchen LINZESS 290 MCG  CAPS capsule TAKE 1 CAPSULE BY MOUTH EACH MORNING 30 MINUTES BEFORE BREAKFAST.  Marland Kitchen loperamide (IMODIUM) 2 MG capsule Take 4 mg by mouth daily as needed for diarrhea or loose stools. Take 2 capsules initially then take 2 after each loose stool per MAR  . metoprolol succinate (TOPROL-XL) 25 MG 24 hr tablet Take 12.5 mg by mouth 2 (two) times daily.   . mirtazapine (REMERON) 30 MG tablet Take 30 mg by mouth at bedtime.    Marland Kitchen NOVOFINE AUTOCOVER 30G X 8 MM MISC   . NOVOLOG FLEXPEN 100 UNIT/ML FlexPen INJECT 35 UNITS SUBCUTANEOUSLY BEFORE MEALS IF BS IS BETWEEN 90-150. 150-200 ADD 1 UNIT , 201-250 ADD 2 UNITS  . NOVOLOG FLEXPEN 100 UNIT/ML FlexPen SS: 251-300 ADD 3 UNITS; 301-350 ADD 4 UNITS; 351-400 ADD 5 UNITS; 401-450 ADD 6 UNITS AND  CALL DOCTOR.  Marland Kitchen omeprazole (PRILOSEC) 40 MG capsule Take 40 mg by mouth daily.  . Polysacch Fe Cmp-Fe Heme Poly (BIFERA) 28 MG TABS 1 po bid  . polyvinyl alcohol (ARTIFICIAL TEARS) 1.4 % ophthalmic solution Place 1 drop into both eyes 3 (three) times daily.  . risperiDONE (RISPERDAL) 1 MG tablet Take 1 mg by mouth at bedtime.  . simvastatin (ZOCOR) 20 MG tablet Take 20 mg by mouth at bedtime.  . sodium chloride (OCEAN) 0.65 % SOLN nasal spray Place 2 sprays into both nostrils 4 (four) times daily.  . solifenacin (VESICARE) 5 MG tablet Take 5 mg by mouth daily.  Marland Kitchen tiZANidine (ZANAFLEX) 2 MG tablet Take 2 mg by mouth daily.  . Vitamin D, Ergocalciferol, (DRISDOL) 50000 UNITS CAPS capsule Take 50,000 Units by mouth every 7 (seven) days. Takes on Fridays.  Marland Kitchen warfarin (COUMADIN) 5 MG tablet TAKE 1 TABLET BY MOUTH ONCE DAILY.  Marland Kitchen zolpidem (AMBIEN) 10 MG tablet Take 10 mg by mouth at bedtime.  . [DISCONTINUED] NOVOLOG FLEXPEN 100 UNIT/ML FlexPen SS: 251-300 ADD 3 UNITS; 301-350 ADD 4 UNITS; 351-400 ADD 5 UNITS; 401-450 ADD 6 UNITS AND CALL DOCTOR.   No facility-administered encounter medications on file as of 05/19/2015.   ALLERGIES: Allergies  Allergen Reactions  . Sulfa Antibiotics Rash  . Sulfonamide Derivatives Rash    REACTION: rash   VACCINATION STATUS:  There is no immunization history on file for this patient.  Diabetes She presents for her follow-up diabetic visit. She has type 2 diabetes mellitus. Onset time: She was diagnosed at approximate age of 57 years. Her disease course has been worsening. There are no hypoglycemic associated symptoms. Pertinent negatives for hypoglycemia include no confusion, pallor or seizures. Pertinent negatives for diabetes include no polydipsia, no polyphagia and no polyuria. There are no hypoglycemic complications. Symptoms are worsening. Diabetic complications include a CVA. Risk factors for coronary artery disease include dyslipidemia, diabetes mellitus,  obesity, sedentary lifestyle and hypertension. Current diabetic treatment includes intensive insulin program and oral agent (monotherapy). Her weight is stable. She is following a generally unhealthy diet. When asked about meal planning, she reported none. She never participates in exercise. Her home blood glucose trend is decreasing steadily. Her breakfast blood glucose range is generally 180-200 mg/dl. Her lunch blood glucose range is generally 180-200 mg/dl. Her dinner blood glucose range is generally 180-200 mg/dl. Her overall blood glucose range is 180-200 mg/dl. An ACE inhibitor/angiotensin II receptor blocker is being taken.  Hypertension This is a chronic problem. The current episode started more than 1 year ago. The problem is controlled. Pertinent negatives include no palpitations or shortness of breath. Risk  factors for coronary artery disease include dyslipidemia and diabetes mellitus. Hypertensive end-organ damage includes CVA.  Hyperlipidemia This is a chronic problem. The current episode started more than 1 year ago. Exacerbating diseases include diabetes. Pertinent negatives include no shortness of breath. Risk factors for coronary artery disease include diabetes mellitus, dyslipidemia, hypertension, obesity and a sedentary lifestyle.     Review of Systems  Constitutional: Negative for unexpected weight change.       Uses walker to get around.   HENT: Negative for trouble swallowing and voice change.   Eyes: Negative for visual disturbance.  Respiratory: Negative for shortness of breath and wheezing.   Cardiovascular: Negative for palpitations and leg swelling.  Gastrointestinal: Negative for diarrhea.  Endocrine: Negative for cold intolerance, heat intolerance, polydipsia, polyphagia and polyuria.  Skin: Negative for color change, pallor and wound.  Neurological: Negative for seizures.  Psychiatric/Behavioral: Negative for suicidal ideas and confusion.    Objective:    BP  91/54 mmHg  Pulse 111  Ht 5\' 6"  (1.676 m)  Wt 172 lb (78.019 kg)  BMI 27.77 kg/m2  SpO2 97%  Wt Readings from Last 3 Encounters:  05/19/15 172 lb (78.019 kg)  04/06/15 170 lb 3.2 oz (77.202 kg)  03/31/15 169 lb (76.658 kg)    Physical Exam  Constitutional: She is oriented to person, place, and time. She appears well-developed.  HENT:  Head: Normocephalic and atraumatic.  Eyes: EOM are normal.  Neck: Normal range of motion. Neck supple. No tracheal deviation present. No thyromegaly present.  Cardiovascular: Normal rate and regular rhythm.   Pulmonary/Chest: Effort normal and breath sounds normal.  Abdominal: Soft. Bowel sounds are normal. There is no tenderness. There is no guarding.  Musculoskeletal: Normal range of motion. She exhibits no edema.  Neurological: She is alert and oriented to person, place, and time. She has normal reflexes.  She uses walker to get around.  Skin: Skin is warm and dry. No rash noted. No erythema. No pallor.  Psychiatric: She has a normal mood and affect. Judgment normal.    Results for orders placed or performed in visit on [id number]  Basic metabolic panel  Result Value Ref Range   Sodium 139 135 - 146 mmol/L   Potassium 4.4 3.5 - 5.3 mmol/L   Chloride 100 98 - 110 mmol/L   CO2 28 20 - 31 mmol/L   Glucose, Bld 167 (H) 65 - 99 mg/dL   BUN 11 7 - 25 mg/dL   Creat 0.67 0.50 - 0.99 mg/dL   Calcium 8.8 8.6 - 10.4 mg/dL  Hemoglobin A1c  Result Value Ref Range   Hgb A1c MFr Bld 8.2 (H) <5.7 %   Mean Plasma Glucose 189 mg/dL   Complete Blood Count (Most recent): Lab Results  Component Value Date   WBC 7.3 05/12/2015   HGB 12.9 05/12/2015   HCT 39.5 05/12/2015   MCV 83.3 05/12/2015   PLT 254 05/12/2015   Chemistry (most recent): Lab Results  Component Value Date   NA 139 05/12/2015   K 4.4 05/12/2015   CL 100 05/12/2015   CO2 28 05/12/2015   BUN 11 05/12/2015   CREATININE 0.67 05/12/2015   Diabetic Labs (most recent): Lab Results   Component Value Date   HGBA1C 8.2* 05/12/2015   HGBA1C 7.7* 02/08/2015   HGBA1C 7.9* 10/29/2014   Lipid Panel     Component Value Date/Time   CHOL * 01/03/2009 0630    210        ATP  III CLASSIFICATION:  <200     mg/dL   Desirable  200-239  mg/dL   Borderline High  >=240    mg/dL   High          TRIG 161* 01/03/2009 0630   HDL 35* 01/03/2009 0630   CHOLHDL 6.0 01/03/2009 0630   VLDL 32 01/03/2009 0630   LDLCALC * 01/03/2009 0630    143        Total Cholesterol/HDL:CHD Risk Coronary Heart Disease Risk Table                     Men   Women  1/2 Average Risk   3.4   3.3  Average Risk       5.0   4.4  2 X Average Risk   9.6   7.1  3 X Average Risk  23.4   11.0        Use the calculated Patient Ratio above and the CHD Risk Table to determine the patient's CHD Risk.        ATP III CLASSIFICATION (LDL):  <100     mg/dL   Optimal  100-129  mg/dL   Near or Above                    Optimal  130-159  mg/dL   Borderline  160-189  mg/dL   High  >190     mg/dL   Very High      Assessment & Plan:   1. Type 2 diabetes mellitus with vascular disease ([place]) Her diabetes is  complicated by recurrent CVA. Patient came with improved glucose profile, and  recent A1c Increasing to 8.2% from 7.7 %.  Recent labs reviewed. - Patient remains at a high risk for more acute and chronic complications of diabetes which include CAD, CVA, CKD, retinopathy, and neuropathy. These are all discussed in detail with the patient.  - I have re-counseled the patient on diet management and weight loss  by adopting a carbohydrate restricted / protein rich  Diet. - Patient is advised to stick to a routine mealtimes to eat 3 meals  a day and avoid unnecessary snacks ( to snack only to correct hypoglycemia).  - Suggestion is made for patient to avoid simple carbohydrates   from their diet including Cakes , Desserts, Ice Cream,  Soda (  diet and regular) , Sweet Tea , Candies,  Chips, Cookies, Artificial  Sweeteners,   and "Sugar-free" Products .  This will help patient to have stable blood glucose profile and potentially avoid unintended  Weight gain.   - I have approached patient with the following individualized plan to manage diabetes and patient agrees.  - Continue Levemir 100 units qhs, continue Novolog 35 units TIDAC for premeal BG readings of 90-150mg /dl, plus patient specific sliding scale insulin for correction of unexpected hyperglycemia above 150mg /dl, associated with strict monitoring of BG AC and HS.   -Adjustment parameters for hypo and hyperglycemia were given in a written document to patient. -Patient is encouraged to call clinic for blood glucose levels less than 70 or above 300 mg /dl.  -I will continue Invokana 100mg  po qday, she did not tolerate Metformin.  - Patient will be considered for incretin therapy as appropriate next visit. - Patient specific target  for A1c; LDL, HDL, Triglycerides, and  Waist Circumference were discussed in detail.  2) BP/HTN: Tightly Controlled. Continue current medications including ACEI. She is only on very low-dose metoprolol  and very low-dose lisinopril. If she becomes symptomatic of hypotension, one of these medications should be stopped. 3) Lipids/HPL: continue statins. 4)  Weight/Diet:  exercise, and carbohydrates information provided.  5) Chronic Care/Health Maintenance:  -Patient is  on ACEI and Statin medications and encouraged to continue to follow up with Ophthalmology, Podiatrist at least yearly or according to recommendations, and advised to  stay away from smoking. I have recommended yearly flu vaccine and pneumonia vaccination at least every 5 years; and  sleep for at least 7 hours a day.  I advised patient to maintain close follow up with their PCP for primary care needs.  Patient is asked to bring meter and  blood glucose logs during their next visit.   Follow up plan: Return in about 3 months (around 08/18/2015) for  diabetes, high blood pressure, high cholesterol, follow up with pre-visit labs, meter, and logs.  Glade Lloyd, MD Phone: [phone]  Fax: [phone]   05/19/2015, 1:38 PM

## 2015-05-19 NOTE — Patient Instructions (Signed)

## 2015-05-20 DIAGNOSIS — E1165 Type 2 diabetes mellitus with hyperglycemia: Secondary | ICD-10-CM | POA: Diagnosis not present

## 2015-05-20 DIAGNOSIS — E119 Type 2 diabetes mellitus without complications: Secondary | ICD-10-CM | POA: Diagnosis not present

## 2015-05-20 DIAGNOSIS — Z794 Long term (current) use of insulin: Secondary | ICD-10-CM | POA: Diagnosis not present

## 2015-05-20 DIAGNOSIS — I6350 Cerebral infarction due to unspecified occlusion or stenosis of unspecified cerebral artery: Secondary | ICD-10-CM | POA: Diagnosis not present

## 2015-05-20 DIAGNOSIS — H25813 Combined forms of age-related cataract, bilateral: Secondary | ICD-10-CM | POA: Diagnosis not present

## 2015-05-26 ENCOUNTER — Other Ambulatory Visit (HOSPITAL_COMMUNITY): Payer: Medicare Other

## 2015-05-26 ENCOUNTER — Encounter (HOSPITAL_COMMUNITY): Payer: Medicare Other | Attending: Hematology & Oncology | Admitting: Hematology & Oncology

## 2015-05-26 VITALS — BP 112/71 | HR 93 | Temp 98.1°F | Resp 16 | Ht 63.0 in | Wt 176.0 lb

## 2015-05-26 DIAGNOSIS — I10 Essential (primary) hypertension: Secondary | ICD-10-CM | POA: Diagnosis not present

## 2015-05-26 DIAGNOSIS — F419 Anxiety disorder, unspecified: Secondary | ICD-10-CM | POA: Insufficient documentation

## 2015-05-26 DIAGNOSIS — D509 Iron deficiency anemia, unspecified: Secondary | ICD-10-CM | POA: Insufficient documentation

## 2015-05-26 DIAGNOSIS — M1990 Unspecified osteoarthritis, unspecified site: Secondary | ICD-10-CM | POA: Diagnosis not present

## 2015-05-26 DIAGNOSIS — Z9889 Other specified postprocedural states: Secondary | ICD-10-CM | POA: Diagnosis not present

## 2015-05-26 DIAGNOSIS — D649 Anemia, unspecified: Secondary | ICD-10-CM

## 2015-05-26 DIAGNOSIS — M79674 Pain in right toe(s): Secondary | ICD-10-CM | POA: Diagnosis not present

## 2015-05-26 DIAGNOSIS — Z86718 Personal history of other venous thrombosis and embolism: Secondary | ICD-10-CM | POA: Diagnosis not present

## 2015-05-26 DIAGNOSIS — F329 Major depressive disorder, single episode, unspecified: Secondary | ICD-10-CM | POA: Diagnosis not present

## 2015-05-26 DIAGNOSIS — Z8673 Personal history of transient ischemic attack (TIA), and cerebral infarction without residual deficits: Secondary | ICD-10-CM | POA: Insufficient documentation

## 2015-05-26 DIAGNOSIS — Z794 Long term (current) use of insulin: Secondary | ICD-10-CM | POA: Diagnosis not present

## 2015-05-26 DIAGNOSIS — Z7901 Long term (current) use of anticoagulants: Secondary | ICD-10-CM | POA: Diagnosis not present

## 2015-05-26 DIAGNOSIS — B351 Tinea unguium: Secondary | ICD-10-CM | POA: Diagnosis not present

## 2015-05-26 DIAGNOSIS — Z79899 Other long term (current) drug therapy: Secondary | ICD-10-CM | POA: Insufficient documentation

## 2015-05-26 DIAGNOSIS — K219 Gastro-esophageal reflux disease without esophagitis: Secondary | ICD-10-CM | POA: Diagnosis not present

## 2015-05-26 DIAGNOSIS — K625 Hemorrhage of anus and rectum: Secondary | ICD-10-CM | POA: Diagnosis not present

## 2015-05-26 DIAGNOSIS — Z808 Family history of malignant neoplasm of other organs or systems: Secondary | ICD-10-CM | POA: Insufficient documentation

## 2015-05-26 DIAGNOSIS — Z87891 Personal history of nicotine dependence: Secondary | ICD-10-CM | POA: Insufficient documentation

## 2015-05-26 DIAGNOSIS — E119 Type 2 diabetes mellitus without complications: Secondary | ICD-10-CM | POA: Diagnosis not present

## 2015-05-26 DIAGNOSIS — Z882 Allergy status to sulfonamides status: Secondary | ICD-10-CM | POA: Diagnosis not present

## 2015-05-26 DIAGNOSIS — M79675 Pain in left toe(s): Secondary | ICD-10-CM | POA: Diagnosis not present

## 2015-05-26 LAB — RETICULOCYTES
RBC.: 4.74 MIL/uL (ref 3.87–5.11)
Retic Count, Absolute: 66.4 10*3/uL (ref 19.0–186.0)
Retic Ct Pct: 1.4 % (ref 0.4–3.1)

## 2015-05-26 LAB — CBC WITH DIFFERENTIAL/PLATELET
Basophils Absolute: 0 10*3/uL (ref 0.0–0.1)
Basophils Relative: 0 %
Eosinophils Absolute: 0.2 10*3/uL (ref 0.0–0.7)
Eosinophils Relative: 3 %
HCT: 40.8 % (ref 36.0–46.0)
Hemoglobin: 13.3 g/dL (ref 12.0–15.0)
Lymphocytes Relative: 35 %
Lymphs Abs: 2.8 10*3/uL (ref 0.7–4.0)
MCH: 28.1 pg (ref 26.0–34.0)
MCHC: 32.6 g/dL (ref 30.0–36.0)
MCV: 86.1 fL (ref 78.0–100.0)
Monocytes Absolute: 0.6 10*3/uL (ref 0.1–1.0)
Monocytes Relative: 8 %
Neutro Abs: 4.4 10*3/uL (ref 1.7–7.7)
Neutrophils Relative %: 54 %
Platelets: 281 10*3/uL (ref 150–400)
RBC: 4.74 MIL/uL (ref 3.87–5.11)
RDW: 15.3 % (ref 11.5–15.5)
WBC: 8.1 10*3/uL (ref 4.0–10.5)

## 2015-05-26 LAB — FERRITIN: Ferritin: 15 ng/mL (ref 11–307)

## 2015-05-26 LAB — IRON AND TIBC
Iron: 141 ug/dL (ref 28–170)
Saturation Ratios: 36 % — ABNORMAL HIGH (ref 10.4–31.8)
TIBC: 396 ug/dL (ref 250–450)
UIBC: 255 ug/dL

## 2015-05-26 LAB — FOLATE: Folate: 22.2 ng/mL (ref >5.9)

## 2015-05-26 LAB — VITAMIN B12: Vitamin B-12: 251 pg/mL (ref 180–914)

## 2015-05-26 NOTE — Patient Instructions (Addendum)
[place] at [facility] Discharge Instructions  RECOMMENDATIONS MADE BY THE CONSULTANT AND ANY TEST RESULTS WILL BE SENT TO YOUR REFERRING PHYSICIAN.  Labs today and return in 1 month   Thank you for choosing [place] at [facility] to provide your oncology and hematology care.  To afford each patient quality time with our provider, please arrive at least 15 minutes before your scheduled appointment time.   Beginning January 23rd 2017 lab work for the [facility] will be done in the  Main lab at [facility] on 1st floor. If you have a lab appointment with the [facility] please come in thru the  Main Entrance and check in at the main information desk  You need to re-schedule your appointment should you arrive 10 or more minutes late.  We strive to give you quality time with our providers, and arriving late affects you and other patients whose appointments are after yours.  Also, if you no show three or more times for appointments you may be dismissed from the clinic at the providers discretion.     Again, thank you for choosing [facility].  Our hope is that these requests will decrease the amount of time that you wait before being seen by our physicians.       _____________________________________________________________  Should you have questions after your visit to [facility], please contact our office at [phone] between the hours of 8:30 a.m. and 4:30 p.m.  Voicemails left after 4:30 p.m. will not be returned until the following business day.  For prescription refill requests, have your pharmacy contact our office.         Resources For Cancer Patients and their Caregivers ? American Cancer Society: Can assist with transportation, wigs, general needs, runs Look Good Feel Better.        (660) 860-9688 ? Cancer Care: Provides financial assistance, online support groups, medication/co-pay  assistance.  1-800-813-HOPE 856 799 1678) ? [place] Assists [place] cancer patients and their families through emotional , educational and financial support.  406 608 8666 ? [place] DSS Where to apply for food stamps, Medicaid and utility assistance. 2265894121 ? [facility]: Transportation to medical appointments. 2097314168 ? Social Security Administration: May apply for disability if have a Stage IV cancer. (442) 500-4358 (401) 824-3944 ? [employer], Disability and Transit Services: Assists with nutrition, care and transit needs. (779)328-2378

## 2015-05-26 NOTE — Progress Notes (Signed)
[place]  CONSULT NOTE  Patient Care Team: [name], MD as PCP - General (Internal Medicine) Danie Binder, MD as Consulting Physician (Gastroenterology)  CHIEF COMPLAINTS/PURPOSE OF CONSULTATION:  Iron deficiency History of anemia Givens Capsule on 12/21/2014 with no AVM's EGD 11/24/2014 with non erosive gastritis, stricture Colonoscopy 11/24/2014 internal hemorrhoids, polyps Chronic anticoagulation with coumadin, recurrent DVT and CVA   HISTORY OF PRESENTING ILLNESS:  [name] 70 y.o. female is here because of a history of anemia and iron deficiency. She is maintained on coumadin for recurrent DVT and a history of CVA.  [name] is accompanied by her caregiver. Presents in wheelchair. She has lived at [facility] since 2007 after she had a stroke. I personally reviewed and went over laboratory studies with the patient at length.   Results for ETHELLE, BERNSEN (MRN [id number]) as of 06/04/2015 17:20  Ref. Range 09/14/2014 17:26 10/21/2014 12:54 11/19/2014 13:30 04/06/2015 12:19 05/12/2015 08:25 05/26/2015 15:20 06/01/2015 07:54  Hemoglobin Latest Ref Range: 11.7-15.5 g/dL 9.1 (L) 8.6 (L) 8.6 (L) 12.2 12.9 13.3 13.6   She denies having any iron infusion or blood transfusions.   She is able to get around with a walker. She does not believe she would be able to get up on the step to get on the examination table. Her leg is weak after the stroke, further stating "my ankle does not go". She gets weak while walking but is able to use it well. Her caregiver admits she uses her walker a lot.   Reports occasional abdominal pain associated with constipation. States her stomach "stays swoll".   She has hemorrhoids "real bad". They do not bleed daily, but when they do it is bright red blood. She eats ice all the time, her caregiver agrees stating "she eats ice every day all day long". The patient states, "I stay dry".  She gets regular screening mammograms.   The  patient is here for further evaluation and discussion of abnormal blood counts.    MEDICAL HISTORY:  Past Medical History  Diagnosis Date  . DVT, lower extremity, recurrent (HCC)     Long-term Coumadin per Dr. Legrand Rams  . Type 2 diabetes mellitus ([place])   . Anxiety   . Depression     History of psychosis and previous suicide attempt  . Hemiplegia ([place])   . GERD (gastroesophageal reflux disease)   . History of stroke     Acute infarct and right cerebral white matter small vessel disease 12/10  . Arthritis   . Essential hypertension   . Schizophrenia ([place])   . Leg DVT (deep venous thromboembolism), acute ([place]) 2006  . Hemiplegia ([place]) 2010    left side  . Stroke [facility])     SURGICAL HISTORY: Past Surgical History  Procedure Laterality Date  . Back surgery    . Colonoscopy with propofol N/A 11/24/2014    Dr. Rudie Meyer polyps removed/moderate sized internal hemorrhoids, tubular adenomas. Next surveillance in 3 years  . Esophagogastroduodenoscopy (egd) with propofol N/A 11/24/2014    Dr. Clayburn Pert HH/patent stricture at the gastroesophageal junction, mild non-erosive gastritis, path negative for H.pylori or celiac sprue  . Polypectomy N/A 11/24/2014    Procedure: POLYPECTOMY;  Surgeon: Danie Binder, MD;  Location: [facility];  Service: Endoscopy;  Laterality: N/A;  . Biopsy N/A 11/24/2014    Procedure: BIOPSY;  Surgeon: Danie Binder, MD;  Location: [facility] ORS;  Service: Endoscopy;  Laterality: N/A;  . Givens capsule study N/A  12/11/2014    MULTILPLE EROSION IN the stomach WITH ACTIVE OOZING. OCCASIONAL EROSIONS AND RARE ULCER SEEN IN PROXIMAL SMALL BOWEL . No masses or AVMs SEEN. NO OLD BLOOD OR FRESH BLOOD SEEN.     SOCIAL HISTORY: Social History   Social History  . Marital Status: Widowed    Spouse Name: N/A  . Number of Children: N/A  . Years of Education: N/A   Occupational History  . Not on file.   Social History Main Topics  . Smoking status: Former Smoker    Quit date:  01/24/1995  . Smokeless tobacco: Not on file  . Alcohol Use: No  . Drug Use: No  . Sexual Activity: No   Other Topics Concern  . 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 Colon cancer Neg Hx    Father died when he was young. Mother died at 14 yo of diabetes and stroke. Brother is still living. She believes he is healthy.  ALLERGIES:  is allergic to sulfa antibiotics and sulfonamide derivatives.  MEDICATIONS:  Current Outpatient Prescriptions  Medication Sig Dispense Refill  . acetaminophen (TYLENOL) 325 MG tablet Take 650 mg by mouth every 6 (six) hours as needed. For fever > 101     . calcium carbonate (TUMS - DOSED IN MG ELEMENTAL CALCIUM) 500 MG chewable tablet Chew 1 tablet by mouth 4 (four) times daily as needed for indigestion or heartburn.     . canagliflozin (INVOKANA) 100 MG TABS tablet Take 100 mg by mouth daily.    . clonazePAM (KLONOPIN) 0.5 MG tablet Take 0.5 mg by mouth 3 (three) times daily.     Marland Kitchen docusate sodium (COLACE) 100 MG capsule Take 100 mg by mouth at bedtime.    . DULoxetine (CYMBALTA) 60 MG capsule Take 120 mg by mouth daily.     . enalapril (VASOTEC) 5 MG tablet Take 2.5 mg by mouth every morning.     . ferrous sulfate (EQL SLOW RELEASE IRON) 160 (50 Fe) MG TBCR SR tablet Take 1 tablet (160 mg total) by mouth daily. 30 each 11  . HYDROcodone-acetaminophen (NORCO) 5-325 MG per tablet Take 1 tablet by mouth 3 (three) times daily.     Marland Kitchen latanoprost (XALATAN) 0.005 % ophthalmic solution Place 1 drop into both eyes at bedtime.    Marland Kitchen LEVEMIR FLEXTOUCH 100 UNIT/ML Pen INJECT 100 UNITS SUBCUTANEOUSLY AT BEDTIME. 30 mL 2  . LINZESS 290 MCG CAPS capsule TAKE 1 CAPSULE BY MOUTH  EACH MORNING 30 MINUTES BEFORE BREAKFAST. 30 capsule 11  . loperamide (IMODIUM) 2 MG capsule Take 4 mg by mouth daily as needed for diarrhea or loose stools. Take 2 capsules initially then take 2 after each loose stool per MAR    . metoprolol succinate (TOPROL-XL) 25 MG 24 hr tablet Take 12.5 mg by mouth 2 (two) times daily.     . mirtazapine (REMERON) 30 MG tablet Take 30 mg by mouth at bedtime.      Marland Kitchen NOVOFINE AUTOCOVER 30G X 8 MM MISC     . NOVOLOG FLEXPEN 100 UNIT/ML FlexPen INJECT 35 UNITS SUBCUTANEOUSLY BEFORE MEALS IF BS IS BETWEEN 90-150. 150-200 ADD 1 UNIT , 201-250 ADD 2 UNITS 15 mL  2  . NOVOLOG FLEXPEN 100 UNIT/ML FlexPen SS: 251-300 ADD 3 UNITS; 301-350 ADD 4 UNITS; 351-400 ADD 5 UNITS; 401-450 ADD 6 UNITS AND CALL DOCTOR. 15 mL 0  . omeprazole (PRILOSEC) 40 MG capsule Take 40 mg by mouth daily.    . polyvinyl alcohol (ARTIFICIAL TEARS) 1.4 % ophthalmic solution Place 1 drop into both eyes 3 (three) times daily.    . risperiDONE (RISPERDAL) 1 MG tablet Take 1 mg by mouth at bedtime.    . simvastatin (ZOCOR) 20 MG tablet Take 20 mg by mouth at bedtime.    . sodium chloride (OCEAN) 0.65 % SOLN nasal spray Place 2 sprays into both nostrils 4 (four) times daily.    . solifenacin (VESICARE) 5 MG tablet Take 5 mg by mouth daily.    Marland Kitchen tiZANidine (ZANAFLEX) 2 MG tablet Take 2 mg by mouth daily.    . Vitamin D, Ergocalciferol, (DRISDOL) 50000 UNITS CAPS capsule Take 50,000 Units by mouth every 7 (seven) days. Takes on Fridays.    Marland Kitchen warfarin (COUMADIN) 5 MG tablet TAKE 1 TABLET BY MOUTH ONCE DAILY. 30 tablet 6  . zolpidem (AMBIEN) 10 MG tablet Take 10 mg by mouth at bedtime.    . ARIPiprazole (ABILIFY) 2 MG tablet Take 1 tablet (2 mg total) by mouth daily. 30 tablet 0   No current facility-administered medications for this visit.    Review of Systems  HENT: Negative.   Respiratory: Negative.   Cardiovascular: Negative.   Gastrointestinal: Positive for abdominal pain, constipation and  blood in stool.       Blood in stool secondary to hemorrhoids. Described as bright red in color. Abdominal pain with constipation.   Musculoskeletal: Negative.   Skin: Negative.   Neurological: Positive for focal weakness.       Focal weakness secondary to stroke in 2007.  Endo/Heme/Allergies: Negative.   Psychiatric/Behavioral: Negative.   All other systems reviewed and are negative.  14 point ROS was done and is otherwise as detailed above or in HPI   PHYSICAL EXAMINATION: ECOG PERFORMANCE STATUS: 2 - Symptomatic, <50% confined to bed  Filed Vitals:   05/26/15 1357  BP: 112/71  Pulse: 93  Temp: 98.1 F (36.7 C)  Resp: 16   Filed Weights   05/26/15 1359  Weight: 176 lb (79.833 kg)     Physical Exam  Constitutional: She is oriented to person, place, and time and well-developed, well-nourished, and in no distress.  Obese. In wheelchair. Wears glasses. Unable to get on examination table.  HENT:  Head: Normocephalic and atraumatic.  Nose: Nose normal.  Mouth/Throat: Oropharynx is clear and moist. No oropharyngeal exudate.  Eyes: Conjunctivae and EOM are normal. Pupils are equal, round, and reactive to light. Right eye exhibits no discharge. Left eye exhibits no discharge. No scleral icterus.  Neck: Normal range of motion. Neck supple. No tracheal deviation present. No thyromegaly present.  Cardiovascular: Normal rate, regular rhythm and normal heart sounds.  Exam reveals no gallop and no friction rub.   No murmur heard. Pulmonary/Chest: Effort normal and breath sounds normal. She has no wheezes. She has no rales.  Abdominal: Soft. Bowel sounds are normal. She exhibits no distension and no mass. There is no tenderness. There is no rebound and no guarding.  Musculoskeletal: Normal range of motion. She exhibits no edema.  Lymphadenopathy:    She has no cervical adenopathy.  Neurological: She is alert and oriented to person, place, and time. No cranial nerve deficit.  Skin:  Skin is warm and dry. No rash noted.  Psychiatric: Mood, memory, affect and judgment normal.  Nursing note and vitals reviewed.   LABORATORY DATA:  I have reviewed the data as listed Lab Results  Component Value Date   WBC 7.3 05/12/2015   HGB 12.9 05/12/2015   HCT 39.5 05/12/2015   MCV 83.3 05/12/2015   PLT 254 05/12/2015   CMP     Component Value Date/Time   NA 139 05/12/2015 0823   K 4.4 05/12/2015 0823   CL 100 05/12/2015 0823   CO2 28 05/12/2015 0823   GLUCOSE 167* 05/12/2015 0823   BUN 11 05/12/2015 0823   CREATININE 0.67 05/12/2015 0823   CREATININE 0.60 11/19/2014 1330   CALCIUM 8.8 05/12/2015 0823   PROT 7.6 09/14/2014 1726   ALBUMIN 3.9 09/14/2014 1726   AST 17 09/14/2014 1726   ALT 11* 09/14/2014 1726   ALKPHOS 87 09/14/2014 1726   BILITOT 0.2* 09/14/2014 1726   GFRNONAA >60 11/19/2014 1330   GFRAA >60 11/19/2014 1330   Results for CLEMIE, ELSWICK (MRN [id number]) as of 06/06/2015 17:28  Ref. Range 10/21/2014 12:54 04/06/2015 12:19  Ferritin Latest Ref Range: 20-288 ng/mL 6 (L) 14 (L)    RADIOGRAPHIC STUDIES: I have personally reviewed the radiological images as listed and agreed with the findings in the report. No results found.  ASSESSMENT & PLAN:  Iron deficiency History of anemia Givens Capsule on 12/21/2014 with no AVM's EGD 11/24/2014 with non erosive gastritis, stricture Colonoscopy 11/24/2014 internal hemorrhoids, polyps Chronic anticoagulation with coumadin, recurrent DVT and CVA Ferritin 14 ng/ml 04/06/2015  She remains iron deficient, has pagophagia. H/H have normalized. She is tolerating oral iron but I advised her we will check ferritin in one month to see if ferritin has improved. She is to continue.  We will obtain the following labs below and I will see her back in one month for ongoing recommendations.  Orders Placed This Encounter  Procedures  . Ferritin  . CBC with Differential  . Iron and TIBC  . Vitamin B12  . Folate  .  Reticulocytes   All questions were answered. The patient knows to call the clinic with any problems, questions or concerns.  This document serves as a record of services personally performed by Ancil Linsey, MD. It was created on her behalf by Arlyce Harman, a trained medical scribe. The creation of this record is based on the scribe's personal observations and the provider's statements to them. This document has been checked and approved by the attending provider.  I have reviewed the above documentation for accuracy and completeness, and I agree with the above.  This note was electronically signed.  Molli Hazard, MD  05/26/2015 2:41 PM

## 2015-05-26 NOTE — Progress Notes (Signed)
[name]'s reason for visit today is for labs as scheduled per MD orders.  Venipuncture performed with a 23 gauge butterfly needle to L Antecubital.  [name] tolerated procedure well and without incident; questions were answered and patient was discharged.

## 2015-05-27 ENCOUNTER — Other Ambulatory Visit: Payer: Self-pay | Admitting: "Endocrinology

## 2015-06-01 DIAGNOSIS — D649 Anemia, unspecified: Secondary | ICD-10-CM | POA: Diagnosis not present

## 2015-06-02 DIAGNOSIS — E1165 Type 2 diabetes mellitus with hyperglycemia: Secondary | ICD-10-CM | POA: Diagnosis not present

## 2015-06-02 DIAGNOSIS — I10 Essential (primary) hypertension: Secondary | ICD-10-CM | POA: Diagnosis not present

## 2015-06-02 DIAGNOSIS — G8190 Hemiplegia, unspecified affecting unspecified side: Secondary | ICD-10-CM | POA: Diagnosis not present

## 2015-06-02 DIAGNOSIS — I825Y9 Chronic embolism and thrombosis of unspecified deep veins of unspecified proximal lower extremity: Secondary | ICD-10-CM | POA: Diagnosis not present

## 2015-06-02 LAB — CBC WITH DIFFERENTIAL/PLATELET
Basophils Absolute: 0 cells/uL (ref 0–200)
Basophils Relative: 0 %
Eosinophils Absolute: 204 cells/uL (ref 15–500)
Eosinophils Relative: 3 %
HCT: 41.1 % (ref 35.0–45.0)
Hemoglobin: 13.6 g/dL (ref 11.7–15.5)
Lymphocytes Relative: 34 %
Lymphs Abs: 2312 cells/uL (ref 850–3900)
MCH: 28.1 pg (ref 27.0–33.0)
MCHC: 33.1 g/dL (ref 32.0–36.0)
MCV: 84.9 fL (ref 80.0–100.0)
MPV: 10.4 fL (ref 7.5–12.5)
Monocytes Absolute: 408 cells/uL (ref 200–950)
Monocytes Relative: 6 %
Neutro Abs: 3876 cells/uL (ref 1500–7800)
Neutrophils Relative %: 57 %
Platelets: 292 10*3/uL (ref 140–400)
RBC: 4.84 MIL/uL (ref 3.80–5.10)
RDW: 16.7 % — ABNORMAL HIGH (ref 11.0–15.0)
WBC: 6.8 10*3/uL (ref 3.8–10.8)

## 2015-06-02 LAB — FERRITIN: Ferritin: 16 ng/mL — ABNORMAL LOW (ref 20–288)

## 2015-06-06 ENCOUNTER — Encounter (HOSPITAL_COMMUNITY): Payer: Self-pay | Admitting: Hematology & Oncology

## 2015-06-07 ENCOUNTER — Ambulatory Visit (INDEPENDENT_AMBULATORY_CARE_PROVIDER_SITE_OTHER): Payer: Medicare Other | Admitting: *Deleted

## 2015-06-07 DIAGNOSIS — Z7901 Long term (current) use of anticoagulants: Secondary | ICD-10-CM

## 2015-06-07 DIAGNOSIS — I82409 Acute embolism and thrombosis of unspecified deep veins of unspecified lower extremity: Secondary | ICD-10-CM

## 2015-06-07 DIAGNOSIS — Z5181 Encounter for therapeutic drug level monitoring: Secondary | ICD-10-CM | POA: Diagnosis not present

## 2015-06-07 DIAGNOSIS — Z8679 Personal history of other diseases of the circulatory system: Secondary | ICD-10-CM | POA: Diagnosis not present

## 2015-06-07 LAB — POCT INR: INR: 2.7

## 2015-06-10 ENCOUNTER — Other Ambulatory Visit: Payer: Self-pay | Admitting: "Endocrinology

## 2015-06-17 ENCOUNTER — Telehealth: Payer: Self-pay | Admitting: Gastroenterology

## 2015-06-17 NOTE — Telephone Encounter (Signed)
PLEASE CALL PT. HER BLOOD COUNT IS NORMAL. HER FERRITIN IS LOW. CONTINUE ORAL IRON.

## 2015-06-18 NOTE — Telephone Encounter (Signed)
Called and informed the nurse, Butch Penny, at [facility].  I am faxing the note to her @ [phone].

## 2015-06-25 ENCOUNTER — Ambulatory Visit (HOSPITAL_COMMUNITY): Payer: Medicare Other | Admitting: Hematology & Oncology

## 2015-06-28 ENCOUNTER — Ambulatory Visit (HOSPITAL_COMMUNITY): Payer: Medicare Other | Admitting: Hematology & Oncology

## 2015-07-03 DIAGNOSIS — I6350 Cerebral infarction due to unspecified occlusion or stenosis of unspecified cerebral artery: Secondary | ICD-10-CM | POA: Diagnosis not present

## 2015-07-03 DIAGNOSIS — E1165 Type 2 diabetes mellitus with hyperglycemia: Secondary | ICD-10-CM | POA: Diagnosis not present

## 2015-07-07 ENCOUNTER — Encounter (HOSPITAL_COMMUNITY): Payer: Self-pay | Admitting: Oncology

## 2015-07-07 ENCOUNTER — Encounter (HOSPITAL_COMMUNITY): Payer: Medicare Other | Attending: Hematology & Oncology | Admitting: Oncology

## 2015-07-07 ENCOUNTER — Encounter (HOSPITAL_COMMUNITY): Payer: Medicare Other

## 2015-07-07 VITALS — BP 140/50 | HR 96 | Temp 97.8°F | Resp 20

## 2015-07-07 DIAGNOSIS — Z86718 Personal history of other venous thrombosis and embolism: Secondary | ICD-10-CM | POA: Insufficient documentation

## 2015-07-07 DIAGNOSIS — Z808 Family history of malignant neoplasm of other organs or systems: Secondary | ICD-10-CM | POA: Diagnosis not present

## 2015-07-07 DIAGNOSIS — I10 Essential (primary) hypertension: Secondary | ICD-10-CM | POA: Diagnosis not present

## 2015-07-07 DIAGNOSIS — F419 Anxiety disorder, unspecified: Secondary | ICD-10-CM | POA: Diagnosis not present

## 2015-07-07 DIAGNOSIS — Z87891 Personal history of nicotine dependence: Secondary | ICD-10-CM | POA: Insufficient documentation

## 2015-07-07 DIAGNOSIS — D509 Iron deficiency anemia, unspecified: Secondary | ICD-10-CM

## 2015-07-07 DIAGNOSIS — E119 Type 2 diabetes mellitus without complications: Secondary | ICD-10-CM | POA: Insufficient documentation

## 2015-07-07 DIAGNOSIS — Z794 Long term (current) use of insulin: Secondary | ICD-10-CM | POA: Diagnosis not present

## 2015-07-07 DIAGNOSIS — Z79899 Other long term (current) drug therapy: Secondary | ICD-10-CM | POA: Diagnosis not present

## 2015-07-07 DIAGNOSIS — F329 Major depressive disorder, single episode, unspecified: Secondary | ICD-10-CM | POA: Diagnosis not present

## 2015-07-07 DIAGNOSIS — Z8673 Personal history of transient ischemic attack (TIA), and cerebral infarction without residual deficits: Secondary | ICD-10-CM | POA: Diagnosis not present

## 2015-07-07 DIAGNOSIS — K219 Gastro-esophageal reflux disease without esophagitis: Secondary | ICD-10-CM | POA: Diagnosis not present

## 2015-07-07 DIAGNOSIS — Z882 Allergy status to sulfonamides status: Secondary | ICD-10-CM | POA: Insufficient documentation

## 2015-07-07 DIAGNOSIS — M1990 Unspecified osteoarthritis, unspecified site: Secondary | ICD-10-CM | POA: Diagnosis not present

## 2015-07-07 DIAGNOSIS — Z7901 Long term (current) use of anticoagulants: Secondary | ICD-10-CM | POA: Insufficient documentation

## 2015-07-07 DIAGNOSIS — Z9889 Other specified postprocedural states: Secondary | ICD-10-CM | POA: Diagnosis not present

## 2015-07-07 LAB — CBC WITH DIFFERENTIAL/PLATELET
Basophils Absolute: 0 10*3/uL (ref 0.0–0.1)
Basophils Relative: 0 %
Eosinophils Absolute: 0.2 10*3/uL (ref 0.0–0.7)
Eosinophils Relative: 2 %
HCT: 43.1 % (ref 36.0–46.0)
Hemoglobin: 14.2 g/dL (ref 12.0–15.0)
Lymphocytes Relative: 40 %
Lymphs Abs: 3.7 10*3/uL (ref 0.7–4.0)
MCH: 29.4 pg (ref 26.0–34.0)
MCHC: 32.9 g/dL (ref 30.0–36.0)
MCV: 89.2 fL (ref 78.0–100.0)
Monocytes Absolute: 0.8 10*3/uL (ref 0.1–1.0)
Monocytes Relative: 9 %
Neutro Abs: 4.5 10*3/uL (ref 1.7–7.7)
Neutrophils Relative %: 49 %
Platelets: 278 10*3/uL (ref 150–400)
RBC: 4.83 MIL/uL (ref 3.87–5.11)
RDW: 13.8 % (ref 11.5–15.5)
WBC: 9.3 10*3/uL (ref 4.0–10.5)

## 2015-07-07 NOTE — Patient Instructions (Signed)
[place] at [facility] Discharge Instructions  RECOMMENDATIONS MADE BY THE CONSULTANT AND ANY TEST RESULTS WILL BE SENT TO YOUR REFERRING PHYSICIAN.  Labs today: CBC diff and Ferritin Labs in 6-8 weeks: CBC diff and Ferritin Return in 6-8 weeks for Follow up  Thank you for choosing [place] at [facility] to provide your oncology and hematology care.  To afford each patient quality time with our provider, please arrive at least 15 minutes before your scheduled appointment time.   Beginning January 23rd 2017 lab work for the [facility] will be done in the  Main lab at [facility] on 1st floor. If you have a lab appointment with the [facility] please come in thru the  Main Entrance and check in at the main information desk  You need to re-schedule your appointment should you arrive 10 or more minutes late.  We strive to give you quality time with our providers, and arriving late affects you and other patients whose appointments are after yours.  Also, if you no show three or more times for appointments you may be dismissed from the clinic at the providers discretion.     Again, thank you for choosing [facility].  Our hope is that these requests will decrease the amount of time that you wait before being seen by our physicians.       _____________________________________________________________  Should you have questions after your visit to [facility], please contact our office at [phone] between the hours of 8:30 a.m. and 4:30 p.m.  Voicemails left after 4:30 p.m. will not be returned until the following business day.  For prescription refill requests, have your pharmacy contact our office.         Resources For Cancer Patients and their Caregivers ? American Cancer Society: Can assist with transportation, wigs, general needs, runs Look Good Feel Better.        918-603-6347 ? Cancer  Care: Provides financial assistance, online support groups, medication/co-pay assistance.  1-800-813-HOPE 303-176-1409) ? [place] Assists [place] cancer patients and their families through emotional , educational and financial support.  9124060126 ? [place] DSS Where to apply for food stamps, Medicaid and utility assistance. 939-072-9592 ? [facility]: Transportation to medical appointments. (938)023-5168 ? Social Security Administration: May apply for disability if have a Stage IV cancer. 5480306507 252-053-5462 ? [employer], Disability and Transit Services: Assists with nutrition, care and transit needs. [place] Support Programs: @10RELATIVEDAYS @ > Cancer Support Group  2nd Tuesday of the month 1pm-2pm, Journey Room  > Creative Journey  3rd Tuesday of the month 1130am-1pm, Journey Room  > Look Good Feel Better  1st Wednesday of the month 10am-12 noon, Journey Room (Call [place] to register [phone])

## 2015-07-07 NOTE — Progress Notes (Signed)
[place], [place] [id number]  IDA (iron deficiency anemia) - Plan: CBC with Differential, Ferritin, CBC with Differential, Ferritin  CURRENT THERAPY: Ferrous sulfate 160mg  PO daily.  INTERVAL HISTORY: [name] 70 y.o. female returns for followup of iron deficiency anemia in the setting of chronic anticoagulation with Coumadin for recurrent DVT/CVA.  She continues with PO iron.  She is tolerating well.  No nausea, vomiting, diarrhea, abd pain.  She does admit to some intermittent constipation; this is well controlled.   She also notes some BRBPR which is likely secondary to hemorrhoids.  She denies any blood incorporated into her stools.  Review of Systems  Constitutional: Negative.   HENT: Negative for nosebleeds.   Eyes: Negative.   Respiratory: Negative.  Negative for hemoptysis.   Cardiovascular: Negative.   Gastrointestinal: Positive for constipation. Negative for nausea, vomiting, abdominal pain, diarrhea, blood in stool and melena.  Genitourinary: Negative.  Negative for hematuria.  Musculoskeletal: Negative.   Skin: Negative.   Neurological: Negative.   Endo/Heme/Allergies: Negative.   Psychiatric/Behavioral: The patient has insomnia (On Ambien, managed by PCP.).     Past Medical History  Diagnosis Date  . DVT, lower extremity, recurrent (HCC)     Long-term Coumadin per Dr. Legrand Rams  . Type 2 diabetes mellitus ([place])   . Anxiety   . Depression     History of psychosis and previous suicide attempt  . Hemiplegia ([place])   . GERD (gastroesophageal reflux disease)   . History of stroke     Acute infarct and right cerebral white matter small vessel disease 12/10  . Arthritis   . Essential hypertension   . Schizophrenia ([place])   . Leg DVT (deep venous thromboembolism), acute ([place]) 2006  . Hemiplegia ([place]) 2010    left side  . Stroke [facility])     Past Surgical History  Procedure Laterality Date  . Back surgery    .  Colonoscopy with propofol N/A 11/24/2014    Dr. Rudie Meyer polyps removed/moderate sized internal hemorrhoids, tubular adenomas. Next surveillance in 3 years  . Esophagogastroduodenoscopy (egd) with propofol N/A 11/24/2014    Dr. Clayburn Pert HH/patent stricture at the gastroesophageal junction, mild non-erosive gastritis, path negative for H.pylori or celiac sprue  . Polypectomy N/A 11/24/2014    Procedure: POLYPECTOMY;  Surgeon: Danie Binder, MD;  Location: [facility];  Service: Endoscopy;  Laterality: N/A;  . Biopsy N/A 11/24/2014    Procedure: BIOPSY;  Surgeon: Danie Binder, MD;  Location: [facility];  Service: Endoscopy;  Laterality: N/A;  . Givens capsule study N/A 12/11/2014    MULTILPLE EROSION IN the stomach WITH ACTIVE OOZING. OCCASIONAL EROSIONS AND RARE ULCER SEEN IN PROXIMAL SMALL BOWEL . No masses or AVMs SEEN. NO OLD BLOOD OR FRESH BLOOD SEEN.     Family History  Problem Relation Age of Onset  . Colon cancer Neg Hx     Social History   Social History  . Marital Status: Widowed    Spouse Name: N/A  . Number of Children: N/A  . Years of Education: N/A   Social History Main Topics  . Smoking status: Former Smoker    Quit date: 01/24/1995  . Smokeless tobacco: None  . Alcohol Use: No  . Drug Use: No  . Sexual Activity: No   Other Topics Concern  . None   Social History Narrative     PHYSICAL EXAMINATION  ECOG PERFORMANCE STATUS: 1 - Symptomatic but  completely ambulatory  Filed Vitals:   07/07/15 1300  BP: 140/50  Pulse: 96  Temp: 97.8 F (36.6 C)  Resp: 20    GENERAL:alert, no distress, well nourished, well developed, comfortable, cooperative, obese, smiling and accompanied by aid from group home. SKIN: skin color, texture, turgor are normal, no rashes or significant lesions HEAD: Normocephalic, No masses, lesions, tenderness or abnormalities EYES: normal, EOMI, Conjunctiva are pink and non-injected EARS: External ears normal OROPHARYNX:lips, buccal mucosa,  and tongue normal and mucous membranes are moist  NECK: supple, trachea midline LYMPH:  not examined BREAST:not examined LUNGS: clear to auscultation  HEART: regular rate & rhythm, no murmurs and no gallops ABDOMEN:abdomen soft and normal bowel sounds BACK: Back symmetric, no curvature. EXTREMITIES:less then 2 second capillary refill, no joint deformities, effusion, or inflammation, no skin discoloration, no cyanosis  NEURO: alert & oriented x 3 with fluent speech, no focal motor/sensory deficits, gait normal with rolling walker   LABORATORY DATA: CBC    Component Value Date/Time   WBC 6.8 06/01/2015 0754   RBC 4.84 06/01/2015 0754   RBC 4.74 05/26/2015 1520   HGB 13.6 06/01/2015 0754   HCT 41.1 06/01/2015 0754   PLT 292 06/01/2015 0754   MCV 84.9 06/01/2015 0754   MCH 28.1 06/01/2015 0754   MCHC 33.1 06/01/2015 0754   RDW 16.7* 06/01/2015 0754   LYMPHSABS 2312 06/01/2015 0754   MONOABS 408 06/01/2015 0754   EOSABS 204 06/01/2015 0754   BASOSABS 0 06/01/2015 0754      Chemistry      Component Value Date/Time   NA 139 05/12/2015 0823   K 4.4 05/12/2015 0823   CL 100 05/12/2015 0823   CO2 28 05/12/2015 0823   BUN 11 05/12/2015 0823   CREATININE 0.67 05/12/2015 0823   CREATININE 0.60 11/19/2014 1330      Component Value Date/Time   CALCIUM 8.8 05/12/2015 0823   ALKPHOS 87 09/14/2014 1726   AST 17 09/14/2014 1726   ALT 11* 09/14/2014 1726   BILITOT 0.2* 09/14/2014 1726        PENDING LABS:   RADIOGRAPHIC STUDIES:  No results found.   PATHOLOGY:    ASSESSMENT AND PLAN:  IDA (iron deficiency anemia) Iron deficiency anemia in the setting of chronic anticoagulation with Coumadin for recurrent DVT/CVA.  She is S/P complete GI work-up including EGD/Colonoscopy on 11/24/2014 showing non-erosive gastritis and stricture and internal hemorrhoids and polyps. Givens Capsule on 12/21/2014 with no AVM's.  Labs today: CBC diff, ferritin.  I personally reviewed and  went over laboratory results with the patient.  The results are noted within this dictation.  Labs will be updated today.  She will continue with oral iron at this time based upon available lab results.  I did broach the topic of IV iron replacement if indicated.  I reviewed the risks, benefits, alternatives, and side effects including, but not limited to, anaphylaxis.  She is agreeable to this intervention if felt to be beneficial for the patient.  Labs in 6-8 weeks: CBC diff, ferritin.  Return in 6-8 weeks for follow-up.    ORDERS PLACED FOR THIS ENCOUNTER: Orders Placed This Encounter  Procedures  . CBC with Differential  . Ferritin  . CBC with Differential  . Ferritin    MEDICATIONS PRESCRIBED THIS ENCOUNTER: No orders of the defined types were placed in this encounter.    THERAPY PLAN:  Continue with oral iron replacement at this time.  Will continue to monitor iron studies closely  and change/alter treatment and/or treatment course as indicated.  All questions were answered. The patient knows to call the clinic with any problems, questions or concerns. We can certainly see the patient much sooner if necessary.  Patient and plan discussed with Dr. Ancil Linsey and she is in agreement with the aforementioned.   This note is electronically signed by: Doy Mince 07/07/2015 1:44 PM

## 2015-07-07 NOTE — Assessment & Plan Note (Addendum)
Iron deficiency anemia in the setting of chronic anticoagulation with Coumadin for recurrent DVT/CVA.  She is S/P complete GI work-up including EGD/Colonoscopy on 11/24/2014 showing non-erosive gastritis and stricture and internal hemorrhoids and polyps. Givens Capsule on 12/21/2014 with no AVM's.  Labs today: CBC diff, ferritin.  I personally reviewed and went over laboratory results with the patient.  The results are noted within this dictation.  Labs will be updated today.  She will continue with oral iron at this time based upon available lab results.  I did broach the topic of IV iron replacement if indicated.  I reviewed the risks, benefits, alternatives, and side effects including, but not limited to, anaphylaxis.  She is agreeable to this intervention if felt to be beneficial for the patient.  Labs in 6-8 weeks: CBC diff, ferritin.  Return in 6-8 weeks for follow-up.

## 2015-07-08 LAB — FERRITIN: Ferritin: 30 ng/mL (ref 11–307)

## 2015-07-19 ENCOUNTER — Ambulatory Visit (INDEPENDENT_AMBULATORY_CARE_PROVIDER_SITE_OTHER): Payer: Medicare Other | Admitting: *Deleted

## 2015-07-19 DIAGNOSIS — Z7901 Long term (current) use of anticoagulants: Secondary | ICD-10-CM

## 2015-07-19 DIAGNOSIS — Z8679 Personal history of other diseases of the circulatory system: Secondary | ICD-10-CM | POA: Diagnosis not present

## 2015-07-19 DIAGNOSIS — Z5181 Encounter for therapeutic drug level monitoring: Secondary | ICD-10-CM

## 2015-07-19 DIAGNOSIS — I82409 Acute embolism and thrombosis of unspecified deep veins of unspecified lower extremity: Secondary | ICD-10-CM

## 2015-07-19 LAB — POCT INR: INR: 2.4

## 2015-08-02 DIAGNOSIS — I825Y9 Chronic embolism and thrombosis of unspecified deep veins of unspecified proximal lower extremity: Secondary | ICD-10-CM | POA: Diagnosis not present

## 2015-08-02 DIAGNOSIS — I10 Essential (primary) hypertension: Secondary | ICD-10-CM | POA: Diagnosis not present

## 2015-08-09 ENCOUNTER — Other Ambulatory Visit: Payer: Self-pay | Admitting: "Endocrinology

## 2015-08-09 DIAGNOSIS — E1159 Type 2 diabetes mellitus with other circulatory complications: Secondary | ICD-10-CM | POA: Diagnosis not present

## 2015-08-09 LAB — HEMOGLOBIN A1C
Hgb A1c MFr Bld: 7.6 % — ABNORMAL HIGH (ref <5.7)
Mean Plasma Glucose: 171 mg/dL

## 2015-08-09 LAB — COMPLETE METABOLIC PANEL WITH GFR
ALT: 18 U/L (ref 6–29)
AST: 21 U/L (ref 10–35)
Albumin: 4 g/dL (ref 3.6–5.1)
Alkaline Phosphatase: 82 U/L (ref 33–130)
BUN: 13 mg/dL (ref 7–25)
CO2: 24 mmol/L (ref 20–31)
Calcium: 8.9 mg/dL (ref 8.6–10.4)
Chloride: 102 mmol/L (ref 98–110)
Creat: 0.83 mg/dL (ref 0.50–0.99)
GFR, Est African American: 83 mL/min (ref >=60)
GFR, Est Non African American: 72 mL/min (ref >=60)
Glucose, Bld: 175 mg/dL — ABNORMAL HIGH (ref 65–99)
Potassium: 4.2 mmol/L (ref 3.5–5.3)
Sodium: 139 mmol/L (ref 135–146)
Total Bilirubin: 0.4 mg/dL (ref 0.2–1.2)
Total Protein: 6.9 g/dL (ref 6.1–8.1)

## 2015-08-13 ENCOUNTER — Other Ambulatory Visit: Payer: Self-pay | Admitting: "Endocrinology

## 2015-08-17 DIAGNOSIS — F331 Major depressive disorder, recurrent, moderate: Secondary | ICD-10-CM | POA: Diagnosis not present

## 2015-08-18 DIAGNOSIS — M79675 Pain in left toe(s): Secondary | ICD-10-CM | POA: Diagnosis not present

## 2015-08-18 DIAGNOSIS — M79674 Pain in right toe(s): Secondary | ICD-10-CM | POA: Diagnosis not present

## 2015-08-18 DIAGNOSIS — B351 Tinea unguium: Secondary | ICD-10-CM | POA: Diagnosis not present

## 2015-08-23 ENCOUNTER — Encounter: Payer: Self-pay | Admitting: "Endocrinology

## 2015-08-23 ENCOUNTER — Ambulatory Visit (INDEPENDENT_AMBULATORY_CARE_PROVIDER_SITE_OTHER): Payer: Medicare Other | Admitting: "Endocrinology

## 2015-08-23 VITALS — BP 144/68 | HR 89 | Ht 63.0 in | Wt 175.8 lb

## 2015-08-23 DIAGNOSIS — Z9189 Other specified personal risk factors, not elsewhere classified: Secondary | ICD-10-CM | POA: Diagnosis not present

## 2015-08-23 DIAGNOSIS — E1159 Type 2 diabetes mellitus with other circulatory complications: Secondary | ICD-10-CM | POA: Diagnosis not present

## 2015-08-23 DIAGNOSIS — E785 Hyperlipidemia, unspecified: Secondary | ICD-10-CM

## 2015-08-23 DIAGNOSIS — I10 Essential (primary) hypertension: Secondary | ICD-10-CM | POA: Diagnosis not present

## 2015-08-23 DIAGNOSIS — E6609 Other obesity due to excess calories: Secondary | ICD-10-CM

## 2015-08-23 NOTE — Patient Instructions (Signed)

## 2015-08-23 NOTE — Progress Notes (Signed)
Subjective:    Patient ID: [name], female    DOB: [birth year],    Past Medical History:  Diagnosis Date  . Anxiety   . Arthritis   . Depression    History of psychosis and previous suicide attempt  . DVT, lower extremity, recurrent (HCC)    Long-term Coumadin per Dr. Legrand Rams  . Essential hypertension   . GERD (gastroesophageal reflux disease)   . Hemiplegia ([place])   . Hemiplegia ([place]) 2010   left side  . History of stroke    Acute infarct and right cerebral white matter small vessel disease 12/10  . Leg DVT (deep venous thromboembolism), acute ([place]) 2006  . Schizophrenia ([place])   . Stroke ([place])   . Type 2 diabetes mellitus ([place])    Past Surgical History:  Procedure Laterality Date  . BACK SURGERY    . BIOPSY N/A 11/24/2014   Procedure: BIOPSY;  Surgeon: Danie Binder, MD;  Location: [facility];  Service: Endoscopy;  Laterality: N/A;  . COLONOSCOPY WITH PROPOFOL N/A 11/24/2014   Dr. Rudie Meyer polyps removed/moderate sized internal hemorrhoids, tubular adenomas. Next surveillance in 3 years  . ESOPHAGOGASTRODUODENOSCOPY (EGD) WITH PROPOFOL N/A 11/24/2014   Dr. Clayburn Pert HH/patent stricture at the gastroesophageal junction, mild non-erosive gastritis, path negative for H.pylori or celiac sprue  . GIVENS CAPSULE STUDY N/A 12/11/2014   MULTILPLE EROSION IN the stomach WITH ACTIVE OOZING. OCCASIONAL EROSIONS AND RARE ULCER SEEN IN PROXIMAL SMALL BOWEL . No masses or AVMs SEEN. NO OLD BLOOD OR FRESH BLOOD SEEN.   . POLYPECTOMY N/A 11/24/2014   Procedure: POLYPECTOMY;  Surgeon: Danie Binder, MD;  Location: [facility];  Service: Endoscopy;  Laterality: N/A;   Social History   Social History  . Marital status: Widowed    Spouse name: N/A  . Number of children: N/A  . Years of education: N/A   Social History Main Topics  . Smoking status: Former Smoker    Quit date: 01/24/1995  . Smokeless tobacco: Never Used  . Alcohol use No  . Drug use: No  . Sexual activity: No   Other  Topics Concern  . None   Social History Narrative  . None   Outpatient Encounter Prescriptions as of 08/23/2015  Medication Sig  . acetaminophen (TYLENOL) 325 MG tablet Take 650 mg by mouth every 6 (six) hours as needed. For fever > 101   . calcium carbonate (TUMS - DOSED IN MG ELEMENTAL CALCIUM) 500 MG chewable tablet Chew 1 tablet by mouth 4 (four) times daily as needed for indigestion or heartburn.   . canagliflozin (INVOKANA) 100 MG TABS tablet Take 100 mg by mouth daily.  . clonazePAM (KLONOPIN) 0.5 MG tablet Take 0.5 mg by mouth 3 (three) times daily.   Marland Kitchen docusate sodium (COLACE) 100 MG capsule Take 100 mg by mouth at bedtime.  . DULoxetine (CYMBALTA) 60 MG capsule Take 120 mg by mouth daily.   . enalapril (VASOTEC) 5 MG tablet Take 2.5 mg by mouth every morning.   . ferrous sulfate (EQL SLOW RELEASE IRON) 160 (50 Fe) MG TBCR SR tablet Take 1 tablet (160 mg total) by mouth daily.  Marland Kitchen HYDROcodone-acetaminophen (NORCO) 5-325 MG per tablet Take 1 tablet by mouth 3 (three) times daily.   Marland Kitchen latanoprost (XALATAN) 0.005 % ophthalmic solution Place 1 drop into both eyes at bedtime.  Marland Kitchen LEVEMIR FLEXTOUCH 100 UNIT/ML Pen INJECT 100 UNITS SUBCUTANEOUSLY AT BEDTIME.  Marland Kitchen LINZESS 290 MCG CAPS capsule TAKE 1 CAPSULE  BY MOUTH EACH MORNING 30 MINUTES BEFORE BREAKFAST.  Marland Kitchen loperamide (IMODIUM) 2 MG capsule Take 4 mg by mouth daily as needed for diarrhea or loose stools. Take 2 capsules initially then take 2 after each loose stool per MAR  . metoprolol succinate (TOPROL-XL) 25 MG 24 hr tablet Take 12.5 mg by mouth 2 (two) times daily.   . mirtazapine (REMERON) 30 MG tablet Take 30 mg by mouth at bedtime.    Marland Kitchen NOVOFINE AUTOCOVER 30G X 8 MM MISC   . NOVOLOG FLEXPEN 100 UNIT/ML FlexPen SS: 251-300 ADD 3 UNITS; 301-350 ADD 4 UNITS; 351-400 ADD 5 UNITS; 401-450 ADD 6 UNITS AND CALL DOCTOR.  Marland Kitchen omeprazole (PRILOSEC) 40 MG capsule Take 40 mg by mouth daily.  . polyvinyl alcohol (ARTIFICIAL TEARS) 1.4 % ophthalmic  solution Place 1 drop into both eyes 3 (three) times daily.  . risperiDONE (RISPERDAL) 1 MG tablet Take 1 mg by mouth at bedtime.  . simvastatin (ZOCOR) 20 MG tablet Take 20 mg by mouth at bedtime.  . sodium chloride (OCEAN) 0.65 % SOLN nasal spray Place 2 sprays into both nostrils 4 (four) times daily.  . solifenacin (VESICARE) 5 MG tablet Take 5 mg by mouth daily.  Marland Kitchen tiZANidine (ZANAFLEX) 2 MG tablet Take 2 mg by mouth daily.  . Vitamin D, Ergocalciferol, (DRISDOL) 50000 UNITS CAPS capsule Take 50,000 Units by mouth every 7 (seven) days. Takes on Fridays.  Marland Kitchen warfarin (COUMADIN) 5 MG tablet TAKE 1 TABLET BY MOUTH ONCE DAILY.  Marland Kitchen zolpidem (AMBIEN) 10 MG tablet Take 10 mg by mouth at bedtime.  . [DISCONTINUED] NOVOLOG FLEXPEN 100 UNIT/ML FlexPen INJECT 35 UNITS SUBCUTANEOUSLY BEFORE MEALS IF BS IS BETWEEN 90-150. 150-200 ADD 1 UNIT , 201-250 ADD 2 UNITS  . [DISCONTINUED] NOVOLOG FLEXPEN 100 UNIT/ML FlexPen INJECT 35 UNITS SUBCUTANEOUSLY BEFORE MEALS IF BS IS BETWEEN 90-150. 150-200 ADD 1 UNIT , 201-250 ADD 2 UNITS  . ARIPiprazole (ABILIFY) 2 MG tablet Take 1 tablet (2 mg total) by mouth daily.   No facility-administered encounter medications on file as of 08/23/2015.    ALLERGIES: Allergies  Allergen Reactions  . Sulfa Antibiotics Rash  . Sulfonamide Derivatives Rash    REACTION: rash   VACCINATION STATUS:  There is no immunization history on file for this patient.  Diabetes  She presents for her follow-up diabetic visit. She has type 2 diabetes mellitus. Onset time: She was diagnosed at approximate age of 14 years. Her disease course has been improving. There are no hypoglycemic associated symptoms. Pertinent negatives for hypoglycemia include no confusion, pallor or seizures. Pertinent negatives for diabetes include no polydipsia, no polyphagia and no polyuria. There are no hypoglycemic complications. Symptoms are improving. Diabetic complications include a CVA. Risk factors for coronary  artery disease include dyslipidemia, diabetes mellitus, obesity, sedentary lifestyle and hypertension. Current diabetic treatment includes intensive insulin program and oral agent (monotherapy) (She kept requiring large dose of insulin due to consumption of large quantities of processed carbohydrates.Marland Kitchen). Her weight is stable. She is following a generally unhealthy diet. When asked about meal planning, she reported none. She never participates in exercise. Her home blood glucose trend is decreasing steadily. Her breakfast blood glucose range is generally 180-200 mg/dl. Her lunch blood glucose range is generally 180-200 mg/dl. Her dinner blood glucose range is generally 180-200 mg/dl. Her overall blood glucose range is 180-200 mg/dl. An ACE inhibitor/angiotensin II receptor blocker is being taken.  Hypertension  This is a chronic problem. The current episode started more than  1 year ago. The problem is controlled. Pertinent negatives include no palpitations or shortness of breath. Risk factors for coronary artery disease include dyslipidemia and diabetes mellitus. Hypertensive end-organ damage includes CVA.  Hyperlipidemia  This is a chronic problem. The current episode started more than 1 year ago. Exacerbating diseases include diabetes. Pertinent negatives include no shortness of breath. Risk factors for coronary artery disease include diabetes mellitus, dyslipidemia, hypertension, obesity and a sedentary lifestyle.     Review of Systems  Constitutional: Negative for unexpected weight change.       Uses walker to get around.   HENT: Negative for trouble swallowing and voice change.   Eyes: Negative for visual disturbance.  Respiratory: Negative for shortness of breath and wheezing.   Cardiovascular: Negative for palpitations and leg swelling.  Gastrointestinal: Negative for diarrhea.  Endocrine: Negative for cold intolerance, heat intolerance, polydipsia, polyphagia and polyuria.  Skin: Negative  for color change, pallor and wound.  Neurological: Negative for seizures.  Psychiatric/Behavioral: Negative for confusion and suicidal ideas.    Objective:    BP (!) 144/68 (BP Location: Right Arm, Patient Position: Sitting, Cuff Size: Large)   Pulse 89   Ht 5\' 3"  (1.6 m)   Wt 175 lb 12.8 oz (79.7 kg)   SpO2 96%   BMI 31.14 kg/m   Wt Readings from Last 3 Encounters:  08/23/15 175 lb 12.8 oz (79.7 kg)  05/26/15 176 lb (79.8 kg)  05/19/15 172 lb (78 kg)    Physical Exam  Constitutional: She is oriented to person, place, and time. She appears well-developed.  HENT:  Head: Normocephalic and atraumatic.  Eyes: EOM are normal.  Neck: Normal range of motion. Neck supple. No tracheal deviation present. No thyromegaly present.  Cardiovascular: Normal rate and regular rhythm.   Pulmonary/Chest: Effort normal and breath sounds normal.  Abdominal: Soft. Bowel sounds are normal. There is no tenderness. There is no guarding.  Musculoskeletal: Normal range of motion. She exhibits no edema.  Neurological: She is alert and oriented to person, place, and time. She has normal reflexes.  She uses walker to get around.  Skin: Skin is warm and dry. No rash noted. No erythema. No pallor.  Psychiatric: She has a normal mood and affect. Judgment normal.    Results for orders placed or performed in visit on 08/09/15  COMPLETE METABOLIC PANEL WITH GFR  Result Value Ref Range   Sodium 139 135 - 146 mmol/L   Potassium 4.2 3.5 - 5.3 mmol/L   Chloride 102 98 - 110 mmol/L   CO2 24 20 - 31 mmol/L   Glucose, Bld 175 (H) 65 - 99 mg/dL   BUN 13 7 - 25 mg/dL   Creat 0.83 0.50 - 0.99 mg/dL   Total Bilirubin 0.4 0.2 - 1.2 mg/dL   Alkaline Phosphatase 82 33 - 130 U/L   AST 21 10 - 35 U/L   ALT 18 6 - 29 U/L   Total Protein 6.9 6.1 - 8.1 g/dL   Albumin 4.0 3.6 - 5.1 g/dL   Calcium 8.9 8.6 - 10.4 mg/dL   GFR, Est African American 83 >=60 mL/min   GFR, Est Non African American 72 >=60 mL/min  Hemoglobin  A1c  Result Value Ref Range   Hgb A1c MFr Bld 7.6 (H) <5.7 %   Mean Plasma Glucose 171 mg/dL   Complete Blood Count (Most recent): Lab Results  Component Value Date   WBC 9.3 07/07/2015   HGB 14.2 07/07/2015   HCT 43.1  07/07/2015   MCV 89.2 07/07/2015   PLT 278 07/07/2015   Chemistry (most recent): Lab Results  Component Value Date   NA 139 08/09/2015   K 4.2 08/09/2015   CL 102 08/09/2015   CO2 24 08/09/2015   BUN 13 08/09/2015   CREATININE 0.83 08/09/2015   Diabetic Labs (most recent): Lab Results  Component Value Date   HGBA1C 7.6 (H) 08/09/2015   HGBA1C 8.2 (H) 05/12/2015   HGBA1C 7.7 (H) 02/08/2015   Lipid Panel     Component Value Date/Time   CHOL (H) 01/03/2009 0630    210        ATP III CLASSIFICATION:  <200     mg/dL   Desirable  200-239  mg/dL   Borderline High  >=240    mg/dL   High          TRIG 161 (H) 01/03/2009 0630   HDL 35 (L) 01/03/2009 0630   CHOLHDL 6.0 01/03/2009 0630   VLDL 32 01/03/2009 0630   LDLCALC (H) 01/03/2009 0630    143        Total Cholesterol/HDL:CHD Risk Coronary Heart Disease Risk Table                     Men   Women  1/2 Average Risk   3.4   3.3  Average Risk       5.0   4.4  2 X Average Risk   9.6   7.1  3 X Average Risk  23.4   11.0        Use the calculated Patient Ratio above and the CHD Risk Table to determine the patient's CHD Risk.        ATP III CLASSIFICATION (LDL):  <100     mg/dL   Optimal  100-129  mg/dL   Near or Above                    Optimal  130-159  mg/dL   Borderline  160-189  mg/dL   High  >190     mg/dL   Very High      Assessment & Plan:   1. Type 2 diabetes mellitus with vascular disease ([place]) Her diabetes is  complicated by recurrent CVA. Patient came with improved glucose profile, and  recent A1c  Has improved to 7.6% from 8.2%.   Recent labs reviewed. - Patient remains at a high risk for more acute and chronic complications of diabetes which include CAD, CVA, CKD, retinopathy,  and neuropathy. These are all discussed in detail with the patient.  - I have re-counseled the patient on diet management and weight loss  by adopting a carbohydrate restricted / protein rich  Diet. - Patient is advised to stick to a routine mealtimes to eat 3 meals  a day and avoid unnecessary snacks ( to snack only to correct hypoglycemia).  - Suggestion is made for patient to avoid simple carbohydrates   from their diet including Cakes , Desserts, Ice Cream,  Soda (  diet and regular) , Sweet Tea , Candies,  Chips, Cookies, Artificial Sweeteners,   and "Sugar-free" Products .  This will help patient to have stable blood glucose profile and potentially avoid unintended  Weight gain.   - I have approached patient with the following individualized plan to manage diabetes and patient agrees.  - Continue Levemir 100 units qhs, continue Novolog 35 units TIDAC for premeal BG readings of 90-150mg /dl, plus patient  specific sliding scale insulin for correction of unexpected hyperglycemia above 150mg /dl, associated with strict monitoring of BG AC and HS.   -Adjustment parameters for hypo and hyperglycemia were given in a written document to patient. -Patient is encouraged to call clinic for blood glucose levels less than 70 or above 300 mg /dl.  -I will continue Invokana 100mg  po qday, she did not tolerate Metformin.  - Patient will be considered for incretin therapy as appropriate next visit. - Patient specific target  for A1c; LDL, HDL, Triglycerides, and  Waist Circumference were discussed in detail.  2) BP/HTN: Tightly Controlled. Continue current medications including ACEI. She is only on very low-dose metoprolol and very low-dose lisinopril. If she becomes symptomatic of hypotension, one of these medications should be stopped. 3) Lipids/HPL: continue statins. 4)  Weight/Diet:  exercise, and carbohydrates information provided.  5) Chronic Care/Health Maintenance:  -Patient is  on ACEI and  Statin medications and encouraged to continue to follow up with Ophthalmology, Podiatrist at least yearly or according to recommendations, and advised to  stay away from smoking. I have recommended yearly flu vaccine and pneumonia vaccination at least every 5 years; and  sleep for at least 7 hours a day.  I advised patient to maintain close follow up with their PCP for primary care needs.  Patient is asked to bring meter and  blood glucose logs during their next visit.   Follow up plan: Return in about 3 months (around 11/23/2015) for follow up with pre-visit labs, meter, and logs.  Glade Lloyd, MD Phone: [phone]  Fax: [phone]   08/23/2015, 5:18 PM

## 2015-08-30 ENCOUNTER — Other Ambulatory Visit: Payer: Self-pay | Admitting: "Endocrinology

## 2015-08-30 ENCOUNTER — Ambulatory Visit (INDEPENDENT_AMBULATORY_CARE_PROVIDER_SITE_OTHER): Payer: Medicare Other | Admitting: *Deleted

## 2015-08-30 DIAGNOSIS — Z8679 Personal history of other diseases of the circulatory system: Secondary | ICD-10-CM

## 2015-08-30 DIAGNOSIS — I82409 Acute embolism and thrombosis of unspecified deep veins of unspecified lower extremity: Secondary | ICD-10-CM

## 2015-08-30 DIAGNOSIS — Z5181 Encounter for therapeutic drug level monitoring: Secondary | ICD-10-CM | POA: Diagnosis not present

## 2015-08-30 DIAGNOSIS — Z7901 Long term (current) use of anticoagulants: Secondary | ICD-10-CM | POA: Diagnosis not present

## 2015-08-30 LAB — POCT INR: INR: 2.1

## 2015-09-01 ENCOUNTER — Encounter (HOSPITAL_COMMUNITY): Payer: Medicare Other | Attending: Oncology | Admitting: Oncology

## 2015-09-01 ENCOUNTER — Ambulatory Visit (HOSPITAL_COMMUNITY): Payer: Medicare Other | Admitting: Hematology & Oncology

## 2015-09-01 ENCOUNTER — Other Ambulatory Visit (HOSPITAL_COMMUNITY): Payer: Medicare Other

## 2015-09-01 ENCOUNTER — Encounter (HOSPITAL_COMMUNITY): Payer: Medicare Other | Attending: Hematology & Oncology

## 2015-09-01 VITALS — BP 121/52 | HR 86 | Temp 98.2°F | Resp 18 | Wt 175.0 lb

## 2015-09-01 DIAGNOSIS — Z9889 Other specified postprocedural states: Secondary | ICD-10-CM | POA: Insufficient documentation

## 2015-09-01 DIAGNOSIS — Z794 Long term (current) use of insulin: Secondary | ICD-10-CM | POA: Diagnosis not present

## 2015-09-01 DIAGNOSIS — Z8673 Personal history of transient ischemic attack (TIA), and cerebral infarction without residual deficits: Secondary | ICD-10-CM | POA: Diagnosis not present

## 2015-09-01 DIAGNOSIS — Z7901 Long term (current) use of anticoagulants: Secondary | ICD-10-CM | POA: Diagnosis not present

## 2015-09-01 DIAGNOSIS — Z86718 Personal history of other venous thrombosis and embolism: Secondary | ICD-10-CM | POA: Insufficient documentation

## 2015-09-01 DIAGNOSIS — I10 Essential (primary) hypertension: Secondary | ICD-10-CM | POA: Insufficient documentation

## 2015-09-01 DIAGNOSIS — Z808 Family history of malignant neoplasm of other organs or systems: Secondary | ICD-10-CM | POA: Insufficient documentation

## 2015-09-01 DIAGNOSIS — D509 Iron deficiency anemia, unspecified: Secondary | ICD-10-CM

## 2015-09-01 DIAGNOSIS — Z79899 Other long term (current) drug therapy: Secondary | ICD-10-CM | POA: Diagnosis not present

## 2015-09-01 DIAGNOSIS — M1990 Unspecified osteoarthritis, unspecified site: Secondary | ICD-10-CM | POA: Insufficient documentation

## 2015-09-01 DIAGNOSIS — F419 Anxiety disorder, unspecified: Secondary | ICD-10-CM | POA: Insufficient documentation

## 2015-09-01 DIAGNOSIS — K219 Gastro-esophageal reflux disease without esophagitis: Secondary | ICD-10-CM | POA: Diagnosis not present

## 2015-09-01 DIAGNOSIS — E119 Type 2 diabetes mellitus without complications: Secondary | ICD-10-CM | POA: Insufficient documentation

## 2015-09-01 DIAGNOSIS — Z882 Allergy status to sulfonamides status: Secondary | ICD-10-CM | POA: Diagnosis not present

## 2015-09-01 DIAGNOSIS — F329 Major depressive disorder, single episode, unspecified: Secondary | ICD-10-CM | POA: Insufficient documentation

## 2015-09-01 DIAGNOSIS — Z87891 Personal history of nicotine dependence: Secondary | ICD-10-CM | POA: Diagnosis not present

## 2015-09-01 LAB — CBC WITH DIFFERENTIAL/PLATELET
Basophils Absolute: 0 10*3/uL (ref 0.0–0.1)
Basophils Relative: 0 %
Eosinophils Absolute: 0.2 10*3/uL (ref 0.0–0.7)
Eosinophils Relative: 2 %
HCT: 41.6 % (ref 36.0–46.0)
Hemoglobin: 14 g/dL (ref 12.0–15.0)
Lymphocytes Relative: 32 %
Lymphs Abs: 2.5 10*3/uL (ref 0.7–4.0)
MCH: 30.6 pg (ref 26.0–34.0)
MCHC: 33.7 g/dL (ref 30.0–36.0)
MCV: 90.8 fL (ref 78.0–100.0)
Monocytes Absolute: 0.6 10*3/uL (ref 0.1–1.0)
Monocytes Relative: 8 %
Neutro Abs: 4.6 10*3/uL (ref 1.7–7.7)
Neutrophils Relative %: 58 %
Platelets: 256 10*3/uL (ref 150–400)
RBC: 4.58 MIL/uL (ref 3.87–5.11)
RDW: 13 % (ref 11.5–15.5)
WBC: 7.9 10*3/uL (ref 4.0–10.5)

## 2015-09-01 LAB — FERRITIN: Ferritin: 24 ng/mL (ref 11–307)

## 2015-09-01 NOTE — Progress Notes (Signed)
[facility]  Progress Note  Patient Care Team: [name], MD as PCP - General (Internal Medicine) Danie Binder, MD as Consulting Physician (Gastroenterology)  CHIEF COMPLAINTS:  Iron deficiency History of anemia Givens Capsule on 12/21/2014 with no AVM's EGD 11/24/2014 with non erosive gastritis, stricture Colonoscopy 11/24/2014 internal hemorrhoids, polyps Chronic anticoagulation with coumadin, recurrent DVT and CVA   HISTORY OF PRESENTING ILLNESS:  [name] 70 y.o. female is seen, accompanied by caregiver, with history of anemia and iron deficiency. She is maintained on coumadin for recurrent DVT and a history of CVA. Hemoglobin had been 8.6 in 10-2014, and ferritin 16 in 05-2015. She continues oral iron, which she takes daily with breakfast. She complains of some nausea thru day, possibly related to the iron, and will change time of iron administration to pm with evening meal. She denies bleeding. Stools are dark with the iron.  She has difficulty ambulating with LLE weakness since CVA and right knee problems. She denies increased SOB, recent infectious illness, abdominal pain. No increase in usual slight swelling LE.  Marland Kitchen   Results for [name] (MRN [id number]) as of 06/04/2015 17:20  Ref. Range 09/14/2014 17:26 10/21/2014 12:54 11/19/2014 13:30 04/06/2015 12:19 05/12/2015 08:25 05/26/2015 15:20 06/01/2015 07:54  Hemoglobin Latest Ref Range: 11.7-15.5 g/dL 9.1 (Bauer) 8.6 (Bauer) 8.6 (Bauer) 12.2 12.9 13.3 13.6             MEDICAL HISTORY:  Past Medical History:  Diagnosis Date   Anxiety    Arthritis    Depression    History of psychosis and previous suicide attempt   DVT, lower extremity, recurrent ([place])    Long-term Coumadin per Dr. Legrand Rams   Essential hypertension    GERD (gastroesophageal reflux disease)    Hemiplegia ([place])    Hemiplegia ([place]) 2010   left side   History of stroke    Acute infarct and right cerebral white matter small vessel  disease 12/10   Leg DVT (deep venous thromboembolism), acute ([place]) 2006   Schizophrenia ([place])    Stroke ([place])    Type 2 diabetes mellitus ([place])     SURGICAL HISTORY: Past Surgical History:  Procedure Laterality Date   BACK SURGERY     BIOPSY N/A 11/24/2014   Procedure: BIOPSY;  Surgeon: Danie Binder, MD;  Location: [facility];  Service: Endoscopy;  Laterality: N/A;   COLONOSCOPY WITH PROPOFOL N/A 11/24/2014   Dr. Rudie Meyer polyps removed/moderate sized internal hemorrhoids, tubular adenomas. Next surveillance in 3 years   ESOPHAGOGASTRODUODENOSCOPY (EGD) WITH PROPOFOL N/A 11/24/2014   Dr. Clayburn Pert HH/patent stricture at the gastroesophageal junction, mild non-erosive gastritis, path negative for H.pylori or celiac sprue   GIVENS CAPSULE STUDY N/A 12/11/2014   MULTILPLE EROSION IN the stomach WITH ACTIVE OOZING. OCCASIONAL EROSIONS AND RARE ULCER SEEN IN PROXIMAL SMALL BOWEL . No masses or AVMs SEEN. NO OLD BLOOD OR FRESH BLOOD SEEN.    POLYPECTOMY N/A 11/24/2014   Procedure: POLYPECTOMY;  Surgeon: Danie Binder, MD;  Location: [facility];  Service: Endoscopy;  Laterality: N/A;    SOCIAL HISTORY: Social History   Social History   Marital status: Widowed    Spouse name: N/A   Number of children: N/A   Years of education: N/A   Occupational History   Not on file.   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  ALLERGIES:  is  allergic to sulfa antibiotics and sulfonamide derivatives.  MEDICATIONS:  Current Outpatient Prescriptions  Medication Sig Dispense Refill   acetaminophen (TYLENOL) 325 MG tablet Take 650 mg by mouth every 6 (six) hours as needed. For fever > 101      calcium carbonate (TUMS - DOSED IN MG ELEMENTAL CALCIUM) 500 MG chewable tablet Chew 1 tablet by mouth 4 (four) times daily as needed for indigestion or heartburn.      canagliflozin (INVOKANA) 100 MG TABS tablet Take 100 mg by mouth daily.     clonazePAM (KLONOPIN) 0.5 MG tablet Take 0.5 mg by mouth 3 (three) times daily.      docusate sodium (COLACE) 100 MG capsule Take 100 mg by mouth at bedtime.     DULoxetine (CYMBALTA) 60 MG capsule Take 120 mg by mouth daily.      enalapril (VASOTEC) 5 MG tablet Take 2.5 mg by mouth every morning.      ferrous sulfate (EQL SLOW RELEASE IRON) 160 (50 Fe) MG TBCR SR tablet Take 1 tablet (160 mg total) by mouth daily. 30 each 11   HYDROcodone-acetaminophen (NORCO) 5-325 MG per tablet Take 1 tablet by mouth 3 (three) times daily.      latanoprost (XALATAN) 0.005 % ophthalmic solution Place 1 drop into both eyes at bedtime.     LEVEMIR FLEXTOUCH 100 UNIT/ML Pen INJECT 100 UNITS SUBCUTANEOUSLY AT BEDTIME. 30 mL 2   LINZESS 290 MCG CAPS capsule TAKE 1 CAPSULE BY MOUTH EACH MORNING 30 MINUTES BEFORE BREAKFAST. 30 capsule 11   loperamide (IMODIUM) 2 MG capsule Take 4 mg by mouth daily as needed for diarrhea or loose stools. Take 2 capsules initially then take 2 after each loose stool per MAR     metoprolol succinate (TOPROL-XL) 25 MG 24 hr tablet Take 12.5 mg by mouth 2 (two) times daily.      mirtazapine (REMERON) 30 MG tablet Take 30 mg by mouth at bedtime.       NOVOFINE AUTOCOVER 30G X 8 MM MISC      NOVOLOG FLEXPEN 100 UNIT/ML FlexPen SS: 251-300 ADD 3 UNITS; 301-350 ADD 4 UNITS; 351-400 ADD 5 UNITS; 401-450 ADD 6 UNITS AND CALL DOCTOR. 15 mL 0   omeprazole (PRILOSEC) 40 MG capsule Take 40 mg  by mouth daily.     polyvinyl alcohol (ARTIFICIAL TEARS) 1.4 % ophthalmic solution Place 1 drop into both eyes 3 (three) times daily.     risperiDONE (RISPERDAL) 1 MG tablet Take 1 mg by mouth at bedtime.     simvastatin (ZOCOR) 20 MG tablet Take 20 mg by mouth at bedtime.     sodium chloride (OCEAN) 0.65 % SOLN nasal spray Place 2 sprays into both nostrils 4 (four) times daily.     solifenacin (VESICARE) 5 MG tablet Take 5 mg by mouth daily.     tiZANidine (ZANAFLEX) 2 MG tablet Take 2 mg by mouth daily.     Vitamin D, Ergocalciferol, (DRISDOL)  50000 UNITS CAPS capsule Take 50,000 Units by mouth every 7 (seven) days. Takes on Fridays.     warfarin (COUMADIN) 5 MG tablet TAKE 1 TABLET BY MOUTH ONCE DAILY. 30 tablet 6   zolpidem (AMBIEN) 10 MG tablet Take 10 mg by mouth at bedtime.     ARIPiprazole (ABILIFY) 2 MG tablet Take 1 tablet (2 mg total) by mouth daily. 30 tablet 0   ARIPiprazole (ABILIFY) 2 MG tablet      enalapril (VASOTEC) 2.5 MG tablet      triamcinolone (KENALOG) 0.025 % cream      No current facility-administered medications for this visit.     Review of Systems  HENT: Negative.   Respiratory: Negative.   Cardiovascular: Negative.   Gastrointestinal: Positive for nausea.       Intermittent nausea associated with iron tablets.  Musculoskeletal: Positive for back pain.       Chronic back pain. Knee pain over the last 3 to 4 weeks.  Skin: Negative.   Neurological: Positive for focal weakness.       Focal weakness secondary to stroke in 2007.  Endo/Heme/Allergies: Negative.   Psychiatric/Behavioral: Positive for depression.  All other systems reviewed and are negative.  14 point ROS was done and is otherwise as detailed above or in HPI   PHYSICAL EXAMINATION: ECOG PERFORMANCE STATUS: 2 - Symptomatic, <50% confined to bed Vitals:   09/01/15 1100  BP: (!) 121/52  Pulse: 86  Resp: 18  Temp: 98.2 F (36.8 C)   Filed Weights   09/01/15 1100  Weight:  175 lb (79.4 kg)     Physical Exam  Constitutional: She is oriented to person, place, and time and well-developed, well-nourished, and in no distress.  Obese. In wheelchair. Wears glasses. Unable to get on examination table.  HENT:  Head: Normocephalic and atraumatic.  Nose: Nose normal.  Mouth/Throat: Oropharynx is clear and moist. No oropharyngeal exudate.  Eyes: Conjunctivae and EOM are normal. Pupils are equal, round, and reactive to light. Right eye exhibits no discharge. Left eye exhibits no discharge. No scleral icterus.  Neck: Normal range of motion. Neck supple. No tracheal deviation present. No thyromegaly present.  Cardiovascular: Normal rate, regular rhythm and normal heart sounds.  Exam reveals no gallop and no friction rub.   No murmur heard. Pulmonary/Chest: Effort normal and breath sounds normal. She has no wheezes. She has no rales.  Abdominal: Soft. Bowel sounds are normal. She exhibits no distension and no mass. There is no tenderness. There is no rebound and no guarding.  Musculoskeletal: Normal range of motion. She exhibits edema.  Trace ankle edema  Lymphadenopathy:    She has no cervical adenopathy.  Neurological: She is alert and oriented to person, place, and time. No cranial nerve deficit.  Skin: Skin is warm and dry. No rash noted.  Psychiatric: Mood, memory, affect and judgment normal.  Nursing note and vitals reviewed.   LABORATORY DATA:  I have reviewed the data as listed Lab Results  Component Value Date   WBC 7.9 09/01/2015   HGB 14.0 09/01/2015   HCT 41.6 09/01/2015   MCV 90.8 09/01/2015   PLT 256 09/01/2015   CMP     Component Value Date/Time   NA 139 08/09/2015 0818   K 4.2 08/09/2015 0818   CL 102 08/09/2015 0818   CO2 24 08/09/2015 0818   GLUCOSE 175 (H) 08/09/2015 0818   BUN 13 08/09/2015 0818   CREATININE 0.83 08/09/2015 0818   CALCIUM 8.9  08/09/2015 0818   PROT 6.9 08/09/2015 0818   ALBUMIN 4.0 08/09/2015 0818   AST 21  08/09/2015 0818   ALT 18 08/09/2015 0818   ALKPHOS 82 08/09/2015 0818   BILITOT 0.4 08/09/2015 0818   GFRNONAA 72 08/09/2015 0818   GFRAA 83 08/09/2015 0818   Results for Leavey, [name] (MRN [id number]) as of 09/01/2015 11:27  Ref. Range 10/21/2014 12:54 04/06/2015 12:19 05/26/2015 15:20 06/01/2015 07:54 07/07/2015 14:00  Ferritin Latest Ref Range: 11 - 307 ng/mL 6 (Bauer) 14 (Bauer) 15 16 (Bauer) 30   Ferritin available after visit on 09-01-15   24  ASSESSMENT & PLAN:  Iron deficiency History of anemia Givens Capsule on 12/21/2014 with no AVM's EGD 11/24/2014 with non erosive gastritis, stricture Colonoscopy 11/24/2014 internal hemorrhoids, polyps Chronic anticoagulation with coumadin, recurrent DVT and CVA   Ferritin resulted after visit still in very low normal range, lower than 06-2015, so will not recommend deceasing iron now. Tho iron absorption is less when taken with food, the food may improve GI tolerance. Will change time of the iron to evening to see if that is better with nausea.  Written order for change in iron dosing sent to facility.   She will return in 2 to 3 months for follow up with labs.   No orders of the defined types were placed in this encounter.  All questions were answered. The patient knows to call the clinic with any problems, questions or concerns.  This document serves as a record of services personally performed by Evlyn Clines, MD. It was created on her behalf by Arlyce Harman, a trained medical scribe. The creation of this record is based on the scribe's personal observations and the provider's statements to them. This document has been checked and approved by the attending provider.  I have reviewed the above documentation for accuracy and completeness, and I agree with the above.  This note was electronically signed.  Carlis Abbott  09/01/2015 11:27 AM  Evlyn Clines, MD

## 2015-09-01 NOTE — Patient Instructions (Signed)
[place] at [facility] Discharge Instructions  RECOMMENDATIONS MADE BY THE CONSULTANT AND ANY TEST RESULTS WILL BE SENT TO YOUR REFERRING PHYSICIAN.  You saw Dr. Marko Plume in place of Dr. Whitney Muse today. Follow up in 2-3 months with Tom. Labs at same time.  Thank you for choosing [place] at [facility] to provide your oncology and hematology care.  To afford each patient quality time with our provider, please arrive at least 15 minutes before your scheduled appointment time.   Beginning January 23rd 2017 lab work for the [facility] will be done in the  Main lab at [facility] on 1st floor. If you have a lab appointment with the [facility] please come in thru the  Main Entrance and check in at the main information desk  You need to re-schedule your appointment should you arrive 10 or more minutes late.  We strive to give you quality time with our providers, and arriving late affects you and other patients whose appointments are after yours.  Also, if you no show three or more times for appointments you may be dismissed from the clinic at the providers discretion.     Again, thank you for choosing [facility].  Our hope is that these requests will decrease the amount of time that you wait before being seen by our physicians.       _____________________________________________________________  Should you have questions after your visit to [facility], please contact our office at [phone] between the hours of 8:30 a.m. and 4:30 p.m.  Voicemails left after 4:30 p.m. will not be returned until the following business day.  For prescription refill requests, have your pharmacy contact our office.         Resources For Cancer Patients and their Caregivers ? American Cancer Society: Can assist with transportation, wigs, general needs, runs Look Good Feel Better.        ([phone] 322-4562 ? Cancer Care: Provides  financial assistance, online support groups, medication/co-pay assistance.  1-800-813-HOPE 337-430-9699) ? [place] Assists [place] cancer patients and their families through emotional , educational and financial support.  321-864-0088 ? [place] DSS Where to apply for food stamps, Medicaid and utility assistance. (484) 584-4609 ? [facility]: Transportation to medical appointments. 831-268-1038 ? Social Security Administration: May apply for disability if have a Stage IV cancer. 838-867-7078 386-142-9392 ? [employer], Disability and Transit Services: Assists with nutrition, care and transit needs. [facility] Support Programs: @10RELATIVEDAYS @ > Cancer Support Group  2nd Tuesday of the month 1pm-2pm, Journey Room  > Creative Journey  3rd Tuesday of the month 1130am-1pm, Journey Room  > Look Good Feel Better  1st Wednesday of the month 10am-12 noon, Journey Room (Call [facility] to register [phone])

## 2015-09-02 ENCOUNTER — Encounter (HOSPITAL_COMMUNITY): Payer: Self-pay | Admitting: Oncology

## 2015-09-02 DIAGNOSIS — G8190 Hemiplegia, unspecified affecting unspecified side: Secondary | ICD-10-CM | POA: Diagnosis not present

## 2015-09-02 DIAGNOSIS — T148 Other injury of unspecified body region: Secondary | ICD-10-CM | POA: Diagnosis not present

## 2015-09-02 DIAGNOSIS — E1165 Type 2 diabetes mellitus with hyperglycemia: Secondary | ICD-10-CM | POA: Diagnosis not present

## 2015-09-27 ENCOUNTER — Other Ambulatory Visit: Payer: Self-pay | Admitting: Cardiology

## 2015-10-01 ENCOUNTER — Other Ambulatory Visit: Payer: Self-pay | Admitting: "Endocrinology

## 2015-10-03 DIAGNOSIS — I6350 Cerebral infarction due to unspecified occlusion or stenosis of unspecified cerebral artery: Secondary | ICD-10-CM | POA: Diagnosis not present

## 2015-10-03 DIAGNOSIS — I10 Essential (primary) hypertension: Secondary | ICD-10-CM | POA: Diagnosis not present

## 2015-10-07 ENCOUNTER — Encounter: Payer: Self-pay | Admitting: Gastroenterology

## 2015-10-07 ENCOUNTER — Ambulatory Visit (INDEPENDENT_AMBULATORY_CARE_PROVIDER_SITE_OTHER): Payer: Medicare Other | Admitting: Gastroenterology

## 2015-10-07 ENCOUNTER — Telehealth: Payer: Self-pay | Admitting: Gastroenterology

## 2015-10-07 VITALS — BP 98/48 | HR 98 | Temp 98.4°F | Ht 67.0 in | Wt 170.6 lb

## 2015-10-07 DIAGNOSIS — D509 Iron deficiency anemia, unspecified: Secondary | ICD-10-CM | POA: Diagnosis not present

## 2015-10-07 NOTE — Patient Instructions (Signed)
We will see you back in 1 year.   Would stop stool softener if having diarrhea and just take Linzess. If you need extra help, you could always go back on the stool softener in addition to the [place].  Please call if you have any concerns.

## 2015-10-07 NOTE — Progress Notes (Addendum)
REVIEWED-NO ADDITIONAL RECOMMENDATIONS.  Referring Provider: [name], MD Primary Care Physician:  [name], MD  Primary GI: Dr. Oneida Alar   Chief Complaint  Patient presents with  . Follow-up    HPI:   [name] is a 70 y.o. female presenting today with a history of  IDA. Colonoscopy/EGD, capsule study completed. IDA felt to be related to erosive gastropathy and enteropathy. Followed by hematology. Next colonoscopy in 3 years.   Diarrhea about 2-3 times a week. No abdominal pain. Was constipated and saw just a scant amount of blood but it quit. On Linzess 290 mcg daily.   Past Medical History:  Diagnosis Date  . Anxiety   . Arthritis   . Depression    History of psychosis and previous suicide attempt  . DVT, lower extremity, recurrent ([facility])    Long-term Coumadin per Dr. Legrand Rams  . Essential hypertension   . GERD (gastroesophageal reflux disease)   . Hemiplegia ([place])   . Hemiplegia ([place]) 2010   left side  . History of stroke    Acute infarct and right cerebral white matter small vessel disease 12/10  . Leg DVT (deep venous thromboembolism), acute ([place]) 2006  . Schizophrenia ([place])   . Stroke ([place])   . Type 2 diabetes mellitus ([place])     Past Surgical History:  Procedure Laterality Date  . BACK SURGERY    . BIOPSY N/A 11/24/2014   Procedure: BIOPSY;  Surgeon: Danie Binder, MD;  Location: [facility];  Service: Endoscopy;  Laterality: N/A;  . COLONOSCOPY WITH PROPOFOL N/A 11/24/2014   Dr. Rudie Meyer polyps removed/moderate sized internal hemorrhoids, tubular adenomas. Next surveillance in 3 years  . ESOPHAGOGASTRODUODENOSCOPY (EGD) WITH PROPOFOL N/A 11/24/2014   Dr. Clayburn Pert HH/patent stricture at the gastroesophageal junction, mild non-erosive gastritis, path negative for H.pylori or celiac sprue  . GIVENS CAPSULE STUDY N/A 12/11/2014   MULTILPLE EROSION IN the stomach WITH ACTIVE OOZING. OCCASIONAL EROSIONS AND RARE ULCER SEEN IN PROXIMAL SMALL BOWEL . No  masses or AVMs SEEN. NO OLD BLOOD OR FRESH BLOOD SEEN.   . POLYPECTOMY N/A 11/24/2014   Procedure: POLYPECTOMY;  Surgeon: Danie Binder, MD;  Location: [facility];  Service: Endoscopy;  Laterality: N/A;    Current Outpatient Prescriptions  Medication Sig Dispense Refill  . acetaminophen (TYLENOL) 325 MG tablet Take 650 mg by mouth every 6 (six) hours as needed. For fever > 101     . ARIPiprazole (ABILIFY) 2 MG tablet     . calcium carbonate (TUMS - DOSED IN MG ELEMENTAL CALCIUM) 500 MG chewable tablet Chew 1 tablet by mouth 4 (four) times daily as needed for indigestion or heartburn.     . canagliflozin (INVOKANA) 100 MG TABS tablet Take 100 mg by mouth daily.    . clonazePAM (KLONOPIN) 0.5 MG tablet Take 0.5 mg by mouth 3 (three) times daily.     Marland Kitchen docusate sodium (COLACE) 100 MG capsule Take 100 mg by mouth at bedtime.    . DULoxetine (CYMBALTA) 60 MG capsule Take 120 mg by mouth daily.     . enalapril (VASOTEC) 2.5 MG tablet     . enalapril (VASOTEC) 5 MG tablet Take 2.5 mg by mouth every morning.     . ferrous sulfate (EQL SLOW RELEASE IRON) 160 (50 Fe) MG TBCR SR tablet Take 1 tablet (160 mg total) by mouth daily. 30 each 11  . HYDROcodone-acetaminophen (NORCO) 5-325 MG per tablet Take 1 tablet by mouth 3 (three) times daily.     Marland Kitchen  latanoprost (XALATAN) 0.005 % ophthalmic solution Place 1 drop into both eyes at bedtime.    Marland Kitchen LEVEMIR FLEXTOUCH 100 UNIT/ML Pen INJECT 100 UNITS SUBCUTANEOUSLY AT BEDTIME. 30 mL 2  . LINZESS 290 MCG CAPS capsule TAKE 1 CAPSULE BY MOUTH EACH MORNING 30 MINUTES BEFORE BREAKFAST. 30 capsule 11  . loperamide (IMODIUM) 2 MG capsule Take 4 mg by mouth daily as needed for diarrhea or loose stools. Take 2 capsules initially then take 2 after each loose stool per MAR    . metoprolol succinate (TOPROL-XL) 25 MG 24 hr tablet Take 12.5 mg by mouth 2 (two) times daily.     . mirtazapine (REMERON) 30 MG tablet Take 30 mg by mouth at bedtime.      Marland Kitchen NOVOFINE AUTOCOVER 30G X 8  MM MISC     . NOVOLOG FLEXPEN 100 UNIT/ML FlexPen INJECT 35 UNITS SUBCUTANEOUSLY BEFORE MEALS IF BS IS BETWEEN 90-150. 150-200 ADD 1 UNIT , 201-250 ADD 2 UNITS 15 mL 0  . omeprazole (PRILOSEC) 40 MG capsule Take 40 mg by mouth daily.    . polyvinyl alcohol (ARTIFICIAL TEARS) 1.4 % ophthalmic solution Place 1 drop into both eyes 3 (three) times daily.    . risperiDONE (RISPERDAL) 1 MG tablet Take 1 mg by mouth at bedtime.    . simvastatin (ZOCOR) 20 MG tablet Take 20 mg by mouth at bedtime.    . sodium chloride (OCEAN) 0.65 % SOLN nasal spray Place 2 sprays into both nostrils 4 (four) times daily.    . solifenacin (VESICARE) 5 MG tablet Take 5 mg by mouth daily.    Marland Kitchen tiZANidine (ZANAFLEX) 2 MG tablet Take 2 mg by mouth daily.    Marland Kitchen triamcinolone (KENALOG) 0.025 % cream     . Vitamin D, Ergocalciferol, (DRISDOL) 50000 UNITS CAPS capsule Take 50,000 Units by mouth every 7 (seven) days. Takes on Fridays.    Marland Kitchen warfarin (COUMADIN) 5 MG tablet TAKE 1 TABLET BY MOUTH ONCE DAILY. 30 tablet 6  . zolpidem (AMBIEN) 10 MG tablet Take 10 mg by mouth at bedtime.    . ARIPiprazole (ABILIFY) 2 MG tablet Take 1 tablet (2 mg total) by mouth daily. 30 tablet 0   No current facility-administered medications for this visit.     Allergies as of 10/07/2015 - Review Complete 10/07/2015  Allergen Reaction Noted  . Sulfa antibiotics Rash 02/23/2010  . Sulfonamide derivatives Rash     Family History  Problem Relation Age of Onset  . Colon cancer Neg Hx     Social History   Social History  . Marital status: Widowed    Spouse name: N/A  . Number of children: N/A  . Years of education: N/A   Social History Main Topics  . Smoking status: Former Smoker    Quit date: 01/24/1995  . Smokeless tobacco: Never Used  . Alcohol use No  . Drug use: No  . Sexual activity: No   Other Topics Concern  . None   Social History Narrative  . None    Review of Systems: As mentioned in HPI   Physical Exam: BP (!)  98/48   Pulse 98   Temp 98.4 F (36.9 C) (Oral)   Ht 5\' 7"  (1.702 m)   Wt 170 lb 9.6 oz (77.4 kg)   BMI 26.72 kg/m  General:   Alert and oriented. No distress noted. Pleasant and cooperative.  Head:  Normocephalic and atraumatic. Eyes:  Conjuctiva clear without scleral icterus. Abdomen:  +BS,  soft, non-tender and non-distended. Obese. Limited with patient sitting up in chair, unable to get on exam table.  Extremities:  Without edema. Neurologic:  Alert and  oriented x4 Psych:  Alert and cooperative. Normal mood and affect.

## 2015-10-07 NOTE — Progress Notes (Signed)
CC'ED TO PCP 

## 2015-10-07 NOTE — Telephone Encounter (Signed)
Butch Penny from [facility] called about patient that was seen this morning by AB. She said that the colace needs to be changed or either made prn and faxed to [facility].

## 2015-10-07 NOTE — Assessment & Plan Note (Signed)
69 year old female with IDA, followed by hematology. Doing well from a GI standpoint. On Linzess 290 and additional stool softeners, reporting occasional loose stool during the week. Recommend holding stool softeners unless absolutely needed. From our standpoint, doing well. Return in 1 year or sooner if needed. Next colonoscopy 2019.

## 2015-10-07 NOTE — Telephone Encounter (Signed)
Forwarding to Anna Boone, NP.  

## 2015-10-10 DIAGNOSIS — F331 Major depressive disorder, recurrent, moderate: Secondary | ICD-10-CM | POA: Diagnosis not present

## 2015-10-11 ENCOUNTER — Ambulatory Visit (INDEPENDENT_AMBULATORY_CARE_PROVIDER_SITE_OTHER): Payer: Medicare Other | Admitting: *Deleted

## 2015-10-11 DIAGNOSIS — I82409 Acute embolism and thrombosis of unspecified deep veins of unspecified lower extremity: Secondary | ICD-10-CM

## 2015-10-11 DIAGNOSIS — Z7901 Long term (current) use of anticoagulants: Secondary | ICD-10-CM

## 2015-10-11 DIAGNOSIS — Z8679 Personal history of other diseases of the circulatory system: Secondary | ICD-10-CM

## 2015-10-11 DIAGNOSIS — Z5181 Encounter for therapeutic drug level monitoring: Secondary | ICD-10-CM

## 2015-10-11 LAB — POCT INR: INR: 2.6

## 2015-10-14 ENCOUNTER — Other Ambulatory Visit: Payer: Self-pay | Admitting: "Endocrinology

## 2015-10-14 MED ORDER — DOCUSATE SODIUM 100 MG PO CAPS: 100.0000 mg | ORAL_CAPSULE | Freq: Every day | ORAL | 3 refills | Status: DC | PRN

## 2015-10-14 NOTE — Telephone Encounter (Signed)
Received a call from one of the pharmacists, Vicente Males, at [facility].  She said she just got a prescription for the pt for Colace prn from Roseanne Kaufman, NP and that pt is already on scheduled Colace.  I told her she just wants her to have it prn now and she said she will cancel the scheduled order.

## 2015-10-14 NOTE — Addendum Note (Signed)
Addended by: Annitta Needs on: 10/14/2015 11:12 AM   Modules accepted: Orders

## 2015-10-14 NOTE — Telephone Encounter (Signed)
I made prn.

## 2015-11-01 DIAGNOSIS — R300 Dysuria: Secondary | ICD-10-CM | POA: Diagnosis not present

## 2015-11-02 DIAGNOSIS — I10 Essential (primary) hypertension: Secondary | ICD-10-CM | POA: Diagnosis not present

## 2015-11-02 DIAGNOSIS — G8190 Hemiplegia, unspecified affecting unspecified side: Secondary | ICD-10-CM | POA: Diagnosis not present

## 2015-11-02 DIAGNOSIS — M159 Polyosteoarthritis, unspecified: Secondary | ICD-10-CM | POA: Diagnosis not present

## 2015-11-02 DIAGNOSIS — Z23 Encounter for immunization: Secondary | ICD-10-CM | POA: Diagnosis not present

## 2015-11-02 DIAGNOSIS — E1165 Type 2 diabetes mellitus with hyperglycemia: Secondary | ICD-10-CM | POA: Diagnosis not present

## 2015-11-10 DIAGNOSIS — E119 Type 2 diabetes mellitus without complications: Secondary | ICD-10-CM | POA: Diagnosis not present

## 2015-11-10 DIAGNOSIS — H25813 Combined forms of age-related cataract, bilateral: Secondary | ICD-10-CM | POA: Diagnosis not present

## 2015-11-10 DIAGNOSIS — Z794 Long term (current) use of insulin: Secondary | ICD-10-CM | POA: Diagnosis not present

## 2015-11-17 ENCOUNTER — Other Ambulatory Visit: Payer: Self-pay | Admitting: "Endocrinology

## 2015-11-17 DIAGNOSIS — E1159 Type 2 diabetes mellitus with other circulatory complications: Secondary | ICD-10-CM | POA: Diagnosis not present

## 2015-11-17 LAB — COMPLETE METABOLIC PANEL WITH GFR
ALT: 18 U/L (ref 6–29)
AST: 19 U/L (ref 10–35)
Albumin: 4.1 g/dL (ref 3.6–5.1)
Alkaline Phosphatase: 89 U/L (ref 33–130)
BUN: 12 mg/dL (ref 7–25)
CO2: 26 mmol/L (ref 20–31)
Calcium: 9.2 mg/dL (ref 8.6–10.4)
Chloride: 101 mmol/L (ref 98–110)
Creat: 0.76 mg/dL (ref 0.60–0.93)
GFR, Est African American: >89 mL/min (ref >=60)
GFR, Est Non African American: 80 mL/min (ref >=60)
Glucose, Bld: 160 mg/dL — ABNORMAL HIGH (ref 65–99)
Potassium: 4.2 mmol/L (ref 3.5–5.3)
Sodium: 138 mmol/L (ref 135–146)
Total Bilirubin: 0.3 mg/dL (ref 0.2–1.2)
Total Protein: 7 g/dL (ref 6.1–8.1)

## 2015-11-18 LAB — HEMOGLOBIN A1C
Hgb A1c MFr Bld: 7 % — ABNORMAL HIGH (ref <5.7)
Mean Plasma Glucose: 154 mg/dL

## 2015-11-22 ENCOUNTER — Ambulatory Visit (INDEPENDENT_AMBULATORY_CARE_PROVIDER_SITE_OTHER): Payer: Medicare Other | Admitting: *Deleted

## 2015-11-22 DIAGNOSIS — Z7901 Long term (current) use of anticoagulants: Secondary | ICD-10-CM | POA: Diagnosis not present

## 2015-11-22 DIAGNOSIS — Z8679 Personal history of other diseases of the circulatory system: Secondary | ICD-10-CM | POA: Diagnosis not present

## 2015-11-22 DIAGNOSIS — I82409 Acute embolism and thrombosis of unspecified deep veins of unspecified lower extremity: Secondary | ICD-10-CM | POA: Diagnosis not present

## 2015-11-22 DIAGNOSIS — Z5181 Encounter for therapeutic drug level monitoring: Secondary | ICD-10-CM

## 2015-11-22 LAB — POCT INR: INR: 3.5

## 2015-11-24 ENCOUNTER — Ambulatory Visit (INDEPENDENT_AMBULATORY_CARE_PROVIDER_SITE_OTHER): Payer: Medicare Other | Admitting: "Endocrinology

## 2015-11-24 ENCOUNTER — Encounter: Payer: Self-pay | Admitting: "Endocrinology

## 2015-11-24 VITALS — BP 124/64 | HR 100 | Resp 18 | Ht 67.0 in | Wt 176.0 lb

## 2015-11-24 DIAGNOSIS — E782 Mixed hyperlipidemia: Secondary | ICD-10-CM

## 2015-11-24 DIAGNOSIS — E1159 Type 2 diabetes mellitus with other circulatory complications: Secondary | ICD-10-CM

## 2015-11-24 DIAGNOSIS — I10 Essential (primary) hypertension: Secondary | ICD-10-CM

## 2015-11-24 DIAGNOSIS — Z9189 Other specified personal risk factors, not elsewhere classified: Secondary | ICD-10-CM | POA: Diagnosis not present

## 2015-11-24 NOTE — Patient Instructions (Signed)

## 2015-11-24 NOTE — Progress Notes (Signed)
Subjective:    Patient ID: [name], female    DOB: [birth year],    Past Medical History:  Diagnosis Date  . Anxiety   . Arthritis   . Depression    History of psychosis and previous suicide attempt  . DVT, lower extremity, recurrent (HCC)    Long-term Coumadin per Dr. Legrand Rams  . Essential hypertension   . GERD (gastroesophageal reflux disease)   . Hemiplegia ([place])   . Hemiplegia ([place]) 2010   left side  . History of stroke    Acute infarct and right cerebral white matter small vessel disease 12/10  . Leg DVT (deep venous thromboembolism), acute ([place]) 2006  . Schizophrenia ([place])   . Stroke ([place])   . Type 2 diabetes mellitus ([place])    Past Surgical History:  Procedure Laterality Date  . BACK SURGERY    . BIOPSY N/A 11/24/2014   Procedure: BIOPSY;  Surgeon: Danie Binder, MD;  Location: [facility];  Service: Endoscopy;  Laterality: N/A;  . COLONOSCOPY WITH PROPOFOL N/A 11/24/2014   Dr. Rudie Meyer polyps removed/moderate sized internal hemorrhoids, tubular adenomas. Next surveillance in 3 years  . ESOPHAGOGASTRODUODENOSCOPY (EGD) WITH PROPOFOL N/A 11/24/2014   Dr. Clayburn Pert HH/patent stricture at the gastroesophageal junction, mild non-erosive gastritis, path negative for H.pylori or celiac sprue  . GIVENS CAPSULE STUDY N/A 12/11/2014   MULTILPLE EROSION IN the stomach WITH ACTIVE OOZING. OCCASIONAL EROSIONS AND RARE ULCER SEEN IN PROXIMAL SMALL BOWEL . No masses or AVMs SEEN. NO OLD BLOOD OR FRESH BLOOD SEEN.   . POLYPECTOMY N/A 11/24/2014   Procedure: POLYPECTOMY;  Surgeon: Danie Binder, MD;  Location: [facility];  Service: Endoscopy;  Laterality: N/A;   Social History   Social History  . Marital status: Widowed    Spouse name: N/A  . Number of children: N/A  . Years of education: N/A   Social History Main Topics  . Smoking status: Former Smoker    Quit date: 01/24/1995  . Smokeless tobacco: Never Used  . Alcohol use No  . Drug use: No  . Sexual activity: No   Other  Topics Concern  . None   Social History Narrative  . None   Outpatient Encounter Prescriptions as of 11/24/2015  Medication Sig  . acetaminophen (TYLENOL) 325 MG tablet Take 650 mg by mouth every 6 (six) hours as needed. For fever > 101   . ARIPiprazole (ABILIFY) 2 MG tablet   . calcium carbonate (TUMS - DOSED IN MG ELEMENTAL CALCIUM) 500 MG chewable tablet Chew 1 tablet by mouth 4 (four) times daily as needed for indigestion or heartburn.   . canagliflozin (INVOKANA) 100 MG TABS tablet Take 100 mg by mouth daily.  . clonazePAM (KLONOPIN) 0.5 MG tablet Take 0.5 mg by mouth 3 (three) times daily.   Marland Kitchen docusate sodium (COLACE) 100 MG capsule Take 1 capsule (100 mg total) by mouth daily as needed for mild constipation. IF LINZESS DOES NOT FACILITATE A BM ON ANY GIVEN DAY.  . DULoxetine (CYMBALTA) 60 MG capsule Take 120 mg by mouth daily.   . enalapril (VASOTEC) 2.5 MG tablet   . enalapril (VASOTEC) 5 MG tablet Take 2.5 mg by mouth every morning.   . ferrous sulfate (EQL SLOW RELEASE IRON) 160 (50 Fe) MG TBCR SR tablet Take 1 tablet (160 mg total) by mouth daily.  Marland Kitchen HYDROcodone-acetaminophen (NORCO) 5-325 MG per tablet Take 1 tablet by mouth 3 (three) times daily.   Marland Kitchen latanoprost (XALATAN) 0.005 %  ophthalmic solution Place 1 drop into both eyes at bedtime.  Marland Kitchen LEVEMIR FLEXTOUCH 100 UNIT/ML Pen INJECT 100 UNITS SUBCUTANEOUSLY AT BEDTIME.  Marland Kitchen LINZESS 290 MCG CAPS capsule TAKE 1 CAPSULE BY MOUTH EACH MORNING 30 MINUTES BEFORE BREAKFAST.  Marland Kitchen loperamide (IMODIUM) 2 MG capsule Take 4 mg by mouth daily as needed for diarrhea or loose stools. Take 2 capsules initially then take 2 after each loose stool per MAR  . metoprolol succinate (TOPROL-XL) 25 MG 24 hr tablet Take 12.5 mg by mouth 2 (two) times daily.   . mirtazapine (REMERON) 30 MG tablet Take 30 mg by mouth at bedtime.    Marland Kitchen NOVOFINE AUTOCOVER 30G X 8 MM MISC   . NOVOLOG FLEXPEN 100 UNIT/ML FlexPen SS: 251-300 ADD 3 UNITS; 301-350 ADD 4 UNITS; 351-400  ADD 5 UNITS; 401-450 ADD 6 UNITS AND CALL DOCTOR.  Marland Kitchen omeprazole (PRILOSEC) 40 MG capsule Take 40 mg by mouth daily.  . polyvinyl alcohol (ARTIFICIAL TEARS) 1.4 % ophthalmic solution Place 1 drop into both eyes 3 (three) times daily.  . risperiDONE (RISPERDAL) 1 MG tablet Take 1 mg by mouth at bedtime.  . simvastatin (ZOCOR) 20 MG tablet Take 20 mg by mouth at bedtime.  . sodium chloride (OCEAN) 0.65 % SOLN nasal spray Place 2 sprays into both nostrils 4 (four) times daily.  . solifenacin (VESICARE) 5 MG tablet Take 5 mg by mouth daily.  Marland Kitchen tiZANidine (ZANAFLEX) 2 MG tablet Take 2 mg by mouth daily.  Marland Kitchen triamcinolone (KENALOG) 0.025 % cream   . Vitamin D, Ergocalciferol, (DRISDOL) 50000 UNITS CAPS capsule Take 50,000 Units by mouth every 7 (seven) days. Takes on Fridays.  Marland Kitchen warfarin (COUMADIN) 5 MG tablet TAKE 1 TABLET BY MOUTH ONCE DAILY.  Marland Kitchen zolpidem (AMBIEN) 10 MG tablet Take 10 mg by mouth at bedtime.  . [DISCONTINUED] NOVOLOG FLEXPEN 100 UNIT/ML FlexPen INJECT 35 UNITS SUBCUTANEOUSLY BEFORE MEALS IF BS IS BETWEEN 90-150. 150-200 ADD 1 UNIT , 201-250 ADD 2 UNITS   No facility-administered encounter medications on file as of 11/24/2015.    ALLERGIES: Allergies  Allergen Reactions  . Sulfa Antibiotics Rash  . Sulfonamide Derivatives Rash    REACTION: rash   VACCINATION STATUS:  There is no immunization history on file for this patient.  Diabetes  She presents for her follow-up diabetic visit. She has type 2 diabetes mellitus. Onset time: She was diagnosed at approximate age of 36 years. Her disease course has been improving. There are no hypoglycemic associated symptoms. Pertinent negatives for hypoglycemia include no confusion, pallor or seizures. Pertinent negatives for diabetes include no polydipsia, no polyphagia and no polyuria. There are no hypoglycemic complications. Symptoms are improving. Diabetic complications include a CVA. Risk factors for coronary artery disease include  dyslipidemia, diabetes mellitus, obesity, sedentary lifestyle and hypertension. Current diabetic treatment includes intensive insulin program and oral agent (monotherapy) (She kept requiring large dose of insulin due to consumption of large quantities of processed carbohydrates.Marland Kitchen). Her weight is increasing steadily. She is following a generally unhealthy diet. When asked about meal planning, she reported none. She never participates in exercise. Her home blood glucose trend is decreasing steadily. Her breakfast blood glucose range is generally 180-200 mg/dl. Her lunch blood glucose range is generally 180-200 mg/dl. Her dinner blood glucose range is generally 180-200 mg/dl. Her overall blood glucose range is 180-200 mg/dl. An ACE inhibitor/angiotensin II receptor blocker is being taken.  Hypertension  This is a chronic problem. The current episode started more than 1 year  ago. The problem is controlled. Pertinent negatives include no palpitations or shortness of breath. Risk factors for coronary artery disease include dyslipidemia and diabetes mellitus. Hypertensive end-organ damage includes CVA.  Hyperlipidemia  This is a chronic problem. The current episode started more than 1 year ago. Exacerbating diseases include diabetes. Pertinent negatives include no shortness of breath. Risk factors for coronary artery disease include diabetes mellitus, dyslipidemia, hypertension, obesity and a sedentary lifestyle.     Review of Systems  Constitutional: Negative for unexpected weight change.       Uses walker to get around.   HENT: Negative for trouble swallowing and voice change.   Eyes: Negative for visual disturbance.  Respiratory: Negative for shortness of breath and wheezing.   Cardiovascular: Negative for palpitations and leg swelling.  Gastrointestinal: Negative for diarrhea.  Endocrine: Negative for cold intolerance, heat intolerance, polydipsia, polyphagia and polyuria.  Skin: Negative for color  change, pallor and wound.  Neurological: Negative for seizures.  Psychiatric/Behavioral: Negative for confusion and suicidal ideas.    Objective:    BP 124/64   Pulse 100   Resp 18   Ht 5\' 7"  (1.702 m)   Wt 176 lb (79.8 kg)   SpO2 96%   BMI 27.57 kg/m   Wt Readings from Last 3 Encounters:  11/24/15 176 lb (79.8 kg)  10/07/15 170 lb 9.6 oz (77.4 kg)  09/01/15 175 lb (79.4 kg)    Physical Exam  Constitutional: She is oriented to person, place, and time. She appears well-developed.  HENT:  Head: Normocephalic and atraumatic.  Eyes: EOM are normal.  Neck: Normal range of motion. Neck supple. No tracheal deviation present. No thyromegaly present.  Cardiovascular: Normal rate and regular rhythm.   Pulmonary/Chest: Effort normal and breath sounds normal.  Abdominal: Soft. Bowel sounds are normal. There is no tenderness. There is no guarding.  Musculoskeletal: Normal range of motion. She exhibits no edema.  Neurological: She is alert and oriented to person, place, and time. She has normal reflexes.  She uses walker to get around.  Skin: Skin is warm and dry. No rash noted. No erythema. No pallor.  Psychiatric: She has a normal mood and affect. Judgment normal.    Results for orders placed or performed in visit on 11/22/15  POCT INR  Result Value Ref Range   INR 3.5    Complete Blood Count (Most recent): Lab Results  Component Value Date   WBC 7.9 09/01/2015   HGB 14.0 09/01/2015   HCT 41.6 09/01/2015   MCV 90.8 09/01/2015   PLT 256 09/01/2015   Chemistry (most recent): Lab Results  Component Value Date   NA 138 11/17/2015   K 4.2 11/17/2015   CL 101 11/17/2015   CO2 26 11/17/2015   BUN 12 11/17/2015   CREATININE 0.76 11/17/2015   Diabetic Labs (most recent): Lab Results  Component Value Date   HGBA1C 7.0 (H) 11/17/2015   HGBA1C 7.6 (H) 08/09/2015   HGBA1C 8.2 (H) 05/12/2015   Lipid Panel     Component Value Date/Time   CHOL (H) 01/03/2009 0630    210         ATP III CLASSIFICATION:  <200     mg/dL   Desirable  200-239  mg/dL   Borderline High  >=240    mg/dL   High          TRIG 161 (H) 01/03/2009 0630   HDL 35 (L) 01/03/2009 0630   CHOLHDL 6.0 01/03/2009 0630   VLDL  32 01/03/2009 0630   LDLCALC (H) 01/03/2009 0630    143        Total Cholesterol/HDL:CHD Risk Coronary Heart Disease Risk Table                     Men   Women  1/2 Average Risk   3.4   3.3  Average Risk       5.0   4.4  2 X Average Risk   9.6   7.1  3 X Average Risk  23.4   11.0        Use the calculated Patient Ratio above and the CHD Risk Table to determine the patient's CHD Risk.        ATP III CLASSIFICATION (LDL):  <100     mg/dL   Optimal  100-129  mg/dL   Near or Above                    Optimal  130-159  mg/dL   Borderline  160-189  mg/dL   High  >190     mg/dL   Very High      Assessment & Plan:   1. Type 2 diabetes mellitus with vascular disease ([place]) Her diabetes is  complicated by recurrent CVA. Patient came with improved glucose profile, and  recent A1c  Has improved to 7% from 8.2%.   Recent labs reviewed. - Patient remains at a high risk for more acute and chronic complications of diabetes which include CAD, CVA, CKD, retinopathy, and neuropathy. These are all discussed in detail with the patient.  - I have re-counseled the patient on diet management and weight loss  by adopting a carbohydrate restricted / protein rich  Diet. - Patient is advised to stick to a routine mealtimes to eat 3 meals  a day and avoid unnecessary snacks ( to snack only to correct hypoglycemia).  - Suggestion is made for patient to avoid simple carbohydrates   from their diet including Cakes , Desserts, Ice Cream,  Soda (  diet and regular) , Sweet Tea , Candies,  Chips, Cookies, Artificial Sweeteners,   and "Sugar-free" Products .  This will help patient to have stable blood glucose profile and potentially avoid unintended  Weight gain.   - I have approached  patient with the following individualized plan to manage diabetes and patient agrees.  -  She is still isn't particularly large dose of insulin due to her indiscretion to processed carbohydrates including soda. -  Continue Levemir 100 units qhs, continue Novolog 35 units TIDAC for premeal BG readings of 90-150mg /dl, plus patient specific sliding scale insulin for correction of unexpected hyperglycemia above 150mg /dl, associated with strict monitoring of BG AC and HS.   -Adjustment parameters for hypo and hyperglycemia were given in a written document to patient. -Patient is encouraged to call clinic for blood glucose levels less than 70 or above 300 mg /dl.  -I will continue Invokana 100mg  po qday, she did not tolerate Metformin. - Patient specific target  for A1c; LDL, HDL, Triglycerides, and  Waist Circumference were discussed in detail.  2) BP/HTN: Controlled. Continue current medications including ACEI. She is only on very low-dose metoprolol and very low-dose lisinopril. If she becomes symptomatic of hypotension, one of these medications should be stopped.  3) Lipids/HPL: continue statins. 4)  Weight/Diet:  exercise, and carbohydrates information provided.  5) Chronic Care/Health Maintenance:  -Patient is  on ACEI and Statin medications and encouraged to  continue to follow up with Ophthalmology, Podiatrist at least yearly or according to recommendations, and advised to  stay away from smoking. I have recommended yearly flu vaccine and pneumonia vaccination at least every 5 years; and  sleep for at least 7 hours a day.  I advised patient to maintain close follow up with their PCP for primary care needs.  Patient is asked to bring meter and  blood glucose logs during their next visit.   Follow up plan: Return in about 3 months (around 02/24/2016) for follow up with pre-visit labs, meter, and logs.  Glade Lloyd, MD Phone: [phone]  Fax: [phone]   11/24/2015, 11:56 AM

## 2015-11-30 ENCOUNTER — Encounter (HOSPITAL_COMMUNITY): Payer: Self-pay | Admitting: Oncology

## 2015-11-30 ENCOUNTER — Encounter (HOSPITAL_COMMUNITY): Payer: Medicare Other | Attending: Oncology | Admitting: Oncology

## 2015-11-30 ENCOUNTER — Encounter (HOSPITAL_COMMUNITY): Payer: Medicare Other

## 2015-11-30 DIAGNOSIS — Z5189 Encounter for other specified aftercare: Secondary | ICD-10-CM | POA: Insufficient documentation

## 2015-11-30 DIAGNOSIS — D500 Iron deficiency anemia secondary to blood loss (chronic): Secondary | ICD-10-CM

## 2015-11-30 DIAGNOSIS — Z9889 Other specified postprocedural states: Secondary | ICD-10-CM | POA: Diagnosis not present

## 2015-11-30 DIAGNOSIS — D509 Iron deficiency anemia, unspecified: Secondary | ICD-10-CM | POA: Diagnosis not present

## 2015-11-30 DIAGNOSIS — Z87891 Personal history of nicotine dependence: Secondary | ICD-10-CM | POA: Insufficient documentation

## 2015-11-30 DIAGNOSIS — Z7901 Long term (current) use of anticoagulants: Secondary | ICD-10-CM | POA: Insufficient documentation

## 2015-11-30 LAB — CBC WITH DIFFERENTIAL/PLATELET
Basophils Absolute: 0 10*3/uL (ref 0.0–0.1)
Basophils Relative: 0 %
Eosinophils Absolute: 0.2 10*3/uL (ref 0.0–0.7)
Eosinophils Relative: 2 %
HCT: 41.8 % (ref 36.0–46.0)
Hemoglobin: 14.4 g/dL (ref 12.0–15.0)
Lymphocytes Relative: 32 %
Lymphs Abs: 3.3 10*3/uL (ref 0.7–4.0)
MCH: 31.4 pg (ref 26.0–34.0)
MCHC: 34.4 g/dL (ref 30.0–36.0)
MCV: 91.3 fL (ref 78.0–100.0)
Monocytes Absolute: 0.6 10*3/uL (ref 0.1–1.0)
Monocytes Relative: 6 %
Neutro Abs: 6.1 10*3/uL (ref 1.7–7.7)
Neutrophils Relative %: 60 %
Platelets: 250 10*3/uL (ref 150–400)
RBC: 4.58 MIL/uL (ref 3.87–5.11)
RDW: 13 % (ref 11.5–15.5)
WBC: 10.2 10*3/uL (ref 4.0–10.5)

## 2015-11-30 LAB — FERRITIN: Ferritin: 61 ng/mL (ref 11–307)

## 2015-11-30 NOTE — Patient Instructions (Addendum)
[place] at [facility] Discharge Instructions  RECOMMENDATIONS MADE BY THE CONSULTANT AND ANY TEST RESULTS WILL BE SENT TO YOUR REFERRING PHYSICIAN.  You were seen today by Kirby Crigler PA-C. Continue taking your iron. Labs and follow up in 4 months.   Thank you for choosing [place] at [facility] to provide your oncology and hematology care.  To afford each patient quality time with our provider, please arrive at least 15 minutes before your scheduled appointment time.   Beginning January 23rd 2017 lab work for the [facility] will be done in the  Main lab at [facility] on 1st floor. If you have a lab appointment with the [facility] please come in thru the  Main Entrance and check in at the main information desk  You need to re-schedule your appointment should you arrive 10 or more minutes late.  We strive to give you quality time with our providers, and arriving late affects you and other patients whose appointments are after yours.  Also, if you no show three or more times for appointments you may be dismissed from the clinic at the providers discretion.     Again, thank you for choosing [facility].  Our hope is that these requests will decrease the amount of time that you wait before being seen by our physicians.       _____________________________________________________________  Should you have questions after your visit to [facility], please contact our office at [phone] between the hours of 8:30 a.m. and 4:30 p.m.  Voicemails left after 4:30 p.m. will not be returned until the following business day.  For prescription refill requests, have your pharmacy contact our office.         Resources For Cancer Patients and their Caregivers ? American Cancer Society: Can assist with transportation, wigs, general needs, runs Look Good Feel Better.        (850)066-2608 ? Cancer Care: Provides  financial assistance, online support groups, medication/co-pay assistance.  1-800-813-HOPE (973)114-5877) ? [place] Assists [place] cancer patients and their families through emotional , educational and financial support.  561 317 2948 ? [place] DSS Where to apply for food stamps, Medicaid and utility assistance. (862) 076-8155 ? [facility]: Transportation to medical appointments. 323-354-7692 ? Social Security Administration: May apply for disability if have a Stage IV cancer. 5098730632 205-697-8991 ? [employer], Disability and Transit Services: Assists with nutrition, care and transit needs. [place] Support Programs: @10RELATIVEDAYS @ > Cancer Support Group  2nd Tuesday of the month 1pm-2pm, Journey Room  > Creative Journey  3rd Tuesday of the month 1130am-1pm, Journey Room  > Look Good Feel Better  1st Wednesday of the month 10am-12 noon, Journey Room (Call [place] to register [phone])

## 2015-11-30 NOTE — Progress Notes (Signed)
[place], MD [place] [address]  Iron deficiency anemia due to chronic blood loss - Plan: CBC with Differential, Ferritin, Iron and TIBC, Renal function panel  CURRENT THERAPY: Ferrous sulfate 160mg  PO daily.  INTERVAL HISTORY: [name] 70 y.o. female returns for followup of iron deficiency anemia in the setting of chronic anticoagulation with Coumadin for recurrent DVT/CVA.  She is doing well.  She notes a change in her PO iron at HS has eliminated her nausea.  She denies any changes in her bowels, namely constipation.  She denies any vomiting.  She denies abdominal pain.  She denies any blood in her stool or dark stool.  Review of Systems  Constitutional: Negative.  Negative for chills, fever and weight loss.  HENT: Negative.  Negative for nosebleeds.   Eyes: Negative.  Negative for double vision.  Respiratory: Negative.  Negative for cough.   Cardiovascular: Negative.  Negative for chest pain.  Gastrointestinal: Negative.  Negative for abdominal pain, blood in stool, constipation, diarrhea, melena, nausea and vomiting.  Genitourinary: Negative.  Negative for hematuria.  Skin: Negative.   Neurological: Negative.  Negative for weakness.  Endo/Heme/Allergies: Negative.   Psychiatric/Behavioral: Negative.     Past Medical History:  Diagnosis Date  . Anxiety   . Arthritis   . Depression    History of psychosis and previous suicide attempt  . DVT, lower extremity, recurrent (HCC)    Long-term Coumadin per Dr. Legrand Rams  . Essential hypertension   . GERD (gastroesophageal reflux disease)   . Hemiplegia ([place])   . Hemiplegia ([place]) 2010   left side  . History of stroke    Acute infarct and right cerebral white matter small vessel disease 12/10  . Leg DVT (deep venous thromboembolism), acute ([place]) 2006  . Schizophrenia ([place])   . Stroke ([place])   . Type 2 diabetes mellitus ([place])     Past Surgical History:  Procedure Laterality  Date  . BACK SURGERY    . BIOPSY N/A 11/24/2014   Procedure: BIOPSY;  Surgeon: Danie Binder, MD;  Location: [facility];  Service: Endoscopy;  Laterality: N/A;  . COLONOSCOPY WITH PROPOFOL N/A 11/24/2014   Dr. Rudie Meyer polyps removed/moderate sized internal hemorrhoids, tubular adenomas. Next surveillance in 3 years  . ESOPHAGOGASTRODUODENOSCOPY (EGD) WITH PROPOFOL N/A 11/24/2014   Dr. Clayburn Pert HH/patent stricture at the gastroesophageal junction, mild non-erosive gastritis, path negative for H.pylori or celiac sprue  . GIVENS CAPSULE STUDY N/A 12/11/2014   MULTILPLE EROSION IN the stomach WITH ACTIVE OOZING. OCCASIONAL EROSIONS AND RARE ULCER SEEN IN PROXIMAL SMALL BOWEL . No masses or AVMs SEEN. NO OLD BLOOD OR FRESH BLOOD SEEN.   . POLYPECTOMY N/A 11/24/2014   Procedure: POLYPECTOMY;  Surgeon: Danie Binder, MD;  Location: [facility];  Service: Endoscopy;  Laterality: N/A;    Family History  Problem Relation Age of Onset  . Colon cancer Neg Hx     Social History   Social History  . Marital status: Widowed    Spouse name: N/A  . Number of children: N/A  . Years of education: N/A   Social History Main Topics  . Smoking status: Former Smoker    Quit date: 01/24/1995  . Smokeless tobacco: Never Used  . Alcohol use No  . Drug use: No  . Sexual activity: No   Other Topics Concern  . None   Social History Narrative  . None     PHYSICAL EXAMINATION  ECOG PERFORMANCE STATUS: 1 - Symptomatic but completely ambulatory  Vitals:   11/30/15 1344  BP: (!) 115/49  Pulse: 96  Resp: 18  Temp: 97.9 F (36.6 C)    GENERAL:alert, no distress, well nourished, well developed, comfortable, cooperative, obese, smiling and accompanied by aid from group home. SKIN: skin color, texture, turgor are normal, no rashes or significant lesions HEAD: Normocephalic, No masses, lesions, tenderness or abnormalities EYES: normal, EOMI, Conjunctiva are pink and non-injected EARS: External ears  normal OROPHARYNX:lips, buccal mucosa, and tongue normal and mucous membranes are moist  NECK: supple, trachea midline LYMPH:  not examined BREAST:not examined LUNGS: clear to auscultation  HEART: regular rate & rhythm, no murmurs and no gallops ABDOMEN:abdomen soft and normal bowel sounds BACK: Back symmetric, no curvature. EXTREMITIES:less then 2 second capillary refill, no joint deformities, effusion, or inflammation, no skin discoloration, no cyanosis  NEURO: alert & oriented x 3 with fluent speech, no focal motor/sensory deficits, gait normal with rolling walker   LABORATORY DATA: CBC    Component Value Date/Time   WBC 10.2 11/30/2015 1255   RBC 4.58 11/30/2015 1255   HGB 14.4 11/30/2015 1255   HCT 41.8 11/30/2015 1255   PLT 250 11/30/2015 1255   MCV 91.3 11/30/2015 1255   MCH 31.4 11/30/2015 1255   MCHC 34.4 11/30/2015 1255   RDW 13.0 11/30/2015 1255   LYMPHSABS 3.3 11/30/2015 1255   MONOABS 0.6 11/30/2015 1255   EOSABS 0.2 11/30/2015 1255   BASOSABS 0.0 11/30/2015 1255      Chemistry      Component Value Date/Time   NA 138 11/17/2015 0803   K 4.2 11/17/2015 0803   CL 101 11/17/2015 0803   CO2 26 11/17/2015 0803   BUN 12 11/17/2015 0803   CREATININE 0.76 11/17/2015 0803      Component Value Date/Time   CALCIUM 9.2 11/17/2015 0803   ALKPHOS 89 11/17/2015 0803   AST 19 11/17/2015 0803   ALT 18 11/17/2015 0803   BILITOT 0.3 11/17/2015 0803        PENDING LABS:   RADIOGRAPHIC STUDIES:  No results found.   PATHOLOGY:    ASSESSMENT AND PLAN:  IDA (iron deficiency anemia) Iron deficiency anemia in the setting of chronic anticoagulation with Coumadin for recurrent DVT/CVA.  She is S/P complete GI work-up including EGD/Colonoscopy on 11/24/2014 showing non-erosive gastritis and stricture and internal hemorrhoids and polyps. Givens Capsule on 12/21/2014 with no AVM's.  Labs today: CBC diff, ferritin.  I personally reviewed and went over laboratory  results with the patient.  The results are noted within this dictation.  HGB is WNL at 14.4 g/dL.  Ferritin is pending at the time of this dictation today.  "I do not want to come back here."  She is agreeable to return at a reduced frequency.  She will continue with PO iron.  Labs in 4 months: CBC diff, ferritin.  Return in 4 months for follow-up.   ORDERS PLACED FOR THIS ENCOUNTER: Orders Placed This Encounter  Procedures  . CBC with Differential  . Ferritin  . Iron and TIBC  . Renal function panel    MEDICATIONS PRESCRIBED THIS ENCOUNTER: No orders of the defined types were placed in this encounter.   THERAPY PLAN:  Continue with oral iron replacement at this time.  Will continue to monitor iron studies closely and change/alter treatment and/or treatment course as indicated.  All questions were answered. The patient knows to call the clinic with any problems, questions or  concerns. We can certainly see the patient much sooner if necessary.  Patient and plan discussed with Dr. Ancil Linsey and she is in agreement with the aforementioned.   This note is electronically signed by: Doy Mince 11/30/2015 2:33 PM

## 2015-11-30 NOTE — Assessment & Plan Note (Addendum)
Iron deficiency anemia in the setting of chronic anticoagulation with Coumadin for recurrent DVT/CVA.  She is S/P complete GI work-up including EGD/Colonoscopy on 11/24/2014 showing non-erosive gastritis and stricture and internal hemorrhoids and polyps. Givens Capsule on 12/21/2014 with no AVM's.  Labs today: CBC diff, ferritin.  I personally reviewed and went over laboratory results with the patient.  The results are noted within this dictation.  HGB is WNL at 14.4 g/dL.  Ferritin is pending at the time of this dictation today.  "I do not want to come back here."  She is agreeable to return at a reduced frequency.  She will continue with PO iron.  Labs in 4 months: CBC diff, ferritin.  Return in 4 months for follow-up.

## 2015-12-01 ENCOUNTER — Telehealth (HOSPITAL_COMMUNITY): Payer: Self-pay | Admitting: *Deleted

## 2015-12-01 NOTE — Telephone Encounter (Signed)
Aware to continue pt on PO iron. Will fax over labs and note from [facility].

## 2015-12-01 NOTE — Telephone Encounter (Signed)
-----   Message from Baird Cancer, PA-C sent at 12/01/2015  3:55 PM EST ----- Her labs are better!  Continue PO iron.

## 2015-12-02 DIAGNOSIS — M79674 Pain in right toe(s): Secondary | ICD-10-CM | POA: Diagnosis not present

## 2015-12-02 DIAGNOSIS — B351 Tinea unguium: Secondary | ICD-10-CM | POA: Diagnosis not present

## 2015-12-02 DIAGNOSIS — M79675 Pain in left toe(s): Secondary | ICD-10-CM | POA: Diagnosis not present

## 2015-12-03 DIAGNOSIS — I10 Essential (primary) hypertension: Secondary | ICD-10-CM | POA: Diagnosis not present

## 2015-12-03 DIAGNOSIS — E1165 Type 2 diabetes mellitus with hyperglycemia: Secondary | ICD-10-CM | POA: Diagnosis not present

## 2015-12-06 ENCOUNTER — Other Ambulatory Visit: Payer: Self-pay | Admitting: "Endocrinology

## 2015-12-13 ENCOUNTER — Ambulatory Visit (INDEPENDENT_AMBULATORY_CARE_PROVIDER_SITE_OTHER): Payer: Medicare Other | Admitting: *Deleted

## 2015-12-13 DIAGNOSIS — Z8679 Personal history of other diseases of the circulatory system: Secondary | ICD-10-CM | POA: Diagnosis not present

## 2015-12-13 DIAGNOSIS — Z5181 Encounter for therapeutic drug level monitoring: Secondary | ICD-10-CM

## 2015-12-13 DIAGNOSIS — Z7901 Long term (current) use of anticoagulants: Secondary | ICD-10-CM | POA: Diagnosis not present

## 2015-12-13 DIAGNOSIS — I82409 Acute embolism and thrombosis of unspecified deep veins of unspecified lower extremity: Secondary | ICD-10-CM | POA: Diagnosis not present

## 2015-12-13 LAB — POCT INR: INR: 3.4

## 2015-12-23 ENCOUNTER — Other Ambulatory Visit: Payer: Self-pay | Admitting: "Endocrinology

## 2015-12-28 DIAGNOSIS — F331 Major depressive disorder, recurrent, moderate: Secondary | ICD-10-CM | POA: Diagnosis not present

## 2015-12-29 DIAGNOSIS — I82549 Chronic embolism and thrombosis of unspecified tibial vein: Secondary | ICD-10-CM | POA: Diagnosis not present

## 2015-12-29 DIAGNOSIS — E1165 Type 2 diabetes mellitus with hyperglycemia: Secondary | ICD-10-CM | POA: Diagnosis not present

## 2015-12-29 DIAGNOSIS — I6350 Cerebral infarction due to unspecified occlusion or stenosis of unspecified cerebral artery: Secondary | ICD-10-CM | POA: Diagnosis not present

## 2015-12-29 DIAGNOSIS — I825Y9 Chronic embolism and thrombosis of unspecified deep veins of unspecified proximal lower extremity: Secondary | ICD-10-CM | POA: Diagnosis not present

## 2015-12-29 DIAGNOSIS — M5489 Other dorsalgia: Secondary | ICD-10-CM | POA: Diagnosis not present

## 2015-12-29 DIAGNOSIS — K8020 Calculus of gallbladder without cholecystitis without obstruction: Secondary | ICD-10-CM | POA: Diagnosis not present

## 2015-12-29 DIAGNOSIS — I10 Essential (primary) hypertension: Secondary | ICD-10-CM | POA: Diagnosis not present

## 2015-12-29 DIAGNOSIS — G4700 Insomnia, unspecified: Secondary | ICD-10-CM | POA: Diagnosis not present

## 2015-12-29 DIAGNOSIS — M159 Polyosteoarthritis, unspecified: Secondary | ICD-10-CM | POA: Diagnosis not present

## 2015-12-29 DIAGNOSIS — G8190 Hemiplegia, unspecified affecting unspecified side: Secondary | ICD-10-CM | POA: Diagnosis not present

## 2015-12-29 DIAGNOSIS — F321 Major depressive disorder, single episode, moderate: Secondary | ICD-10-CM | POA: Diagnosis not present

## 2016-01-03 ENCOUNTER — Ambulatory Visit (INDEPENDENT_AMBULATORY_CARE_PROVIDER_SITE_OTHER): Payer: Medicare Other | Admitting: *Deleted

## 2016-01-03 DIAGNOSIS — I82409 Acute embolism and thrombosis of unspecified deep veins of unspecified lower extremity: Secondary | ICD-10-CM | POA: Diagnosis not present

## 2016-01-03 DIAGNOSIS — Z5181 Encounter for therapeutic drug level monitoring: Secondary | ICD-10-CM | POA: Diagnosis not present

## 2016-01-03 DIAGNOSIS — Z7901 Long term (current) use of anticoagulants: Secondary | ICD-10-CM

## 2016-01-03 DIAGNOSIS — Z8679 Personal history of other diseases of the circulatory system: Secondary | ICD-10-CM

## 2016-01-03 LAB — POCT INR: INR: 4.4

## 2016-01-07 ENCOUNTER — Other Ambulatory Visit: Payer: Self-pay

## 2016-01-07 MED ORDER — FERROUS SULFATE DRIED ER 160 (50 FE) MG PO TBCR: 160.0000 mg | EXTENDED_RELEASE_TABLET | Freq: Every day | ORAL | 11 refills | Status: DC

## 2016-01-31 ENCOUNTER — Ambulatory Visit (INDEPENDENT_AMBULATORY_CARE_PROVIDER_SITE_OTHER): Payer: Medicare Other | Admitting: *Deleted

## 2016-01-31 DIAGNOSIS — I82409 Acute embolism and thrombosis of unspecified deep veins of unspecified lower extremity: Secondary | ICD-10-CM | POA: Diagnosis not present

## 2016-01-31 DIAGNOSIS — Z8679 Personal history of other diseases of the circulatory system: Secondary | ICD-10-CM | POA: Diagnosis not present

## 2016-01-31 DIAGNOSIS — Z7901 Long term (current) use of anticoagulants: Secondary | ICD-10-CM

## 2016-01-31 DIAGNOSIS — Z5181 Encounter for therapeutic drug level monitoring: Secondary | ICD-10-CM | POA: Diagnosis not present

## 2016-01-31 LAB — POCT INR: INR: 2.9

## 2016-02-03 ENCOUNTER — Other Ambulatory Visit: Payer: Self-pay | Admitting: "Endocrinology

## 2016-02-13 ENCOUNTER — Emergency Department (HOSPITAL_COMMUNITY): Payer: Medicare Other

## 2016-02-13 ENCOUNTER — Encounter (HOSPITAL_COMMUNITY): Payer: Self-pay | Admitting: Cardiology

## 2016-02-13 ENCOUNTER — Inpatient Hospital Stay (HOSPITAL_COMMUNITY)
Admission: EM | Admit: 2016-02-13 | Discharge: 2016-02-16 | DRG: 300 | Disposition: A | Discharge status: Skilled Nursing Facility | Payer: Medicare Other | Attending: Internal Medicine | Admitting: Internal Medicine

## 2016-02-13 DIAGNOSIS — R262 Difficulty in walking, not elsewhere classified: Secondary | ICD-10-CM | POA: Diagnosis not present

## 2016-02-13 DIAGNOSIS — D509 Iron deficiency anemia, unspecified: Secondary | ICD-10-CM | POA: Diagnosis present

## 2016-02-13 DIAGNOSIS — E1159 Type 2 diabetes mellitus with other circulatory complications: Secondary | ICD-10-CM | POA: Diagnosis present

## 2016-02-13 DIAGNOSIS — E1151 Type 2 diabetes mellitus with diabetic peripheral angiopathy without gangrene: Principal | ICD-10-CM | POA: Diagnosis present

## 2016-02-13 DIAGNOSIS — F209 Schizophrenia, unspecified: Secondary | ICD-10-CM | POA: Diagnosis not present

## 2016-02-13 DIAGNOSIS — Z794 Long term (current) use of insulin: Secondary | ICD-10-CM

## 2016-02-13 DIAGNOSIS — I248 Other forms of acute ischemic heart disease: Secondary | ICD-10-CM | POA: Diagnosis present

## 2016-02-13 DIAGNOSIS — E1165 Type 2 diabetes mellitus with hyperglycemia: Secondary | ICD-10-CM | POA: Diagnosis present

## 2016-02-13 DIAGNOSIS — Z915 Personal history of self-harm: Secondary | ICD-10-CM

## 2016-02-13 DIAGNOSIS — Z7901 Long term (current) use of anticoagulants: Secondary | ICD-10-CM | POA: Diagnosis not present

## 2016-02-13 DIAGNOSIS — I10 Essential (primary) hypertension: Secondary | ICD-10-CM | POA: Diagnosis not present

## 2016-02-13 DIAGNOSIS — M1990 Unspecified osteoarthritis, unspecified site: Secondary | ICD-10-CM | POA: Diagnosis present

## 2016-02-13 DIAGNOSIS — F419 Anxiety disorder, unspecified: Secondary | ICD-10-CM | POA: Diagnosis present

## 2016-02-13 DIAGNOSIS — Z86718 Personal history of other venous thrombosis and embolism: Secondary | ICD-10-CM

## 2016-02-13 DIAGNOSIS — R4182 Altered mental status, unspecified: Secondary | ICD-10-CM | POA: Diagnosis not present

## 2016-02-13 DIAGNOSIS — I82409 Acute embolism and thrombosis of unspecified deep veins of unspecified lower extremity: Secondary | ICD-10-CM | POA: Diagnosis present

## 2016-02-13 DIAGNOSIS — Z8249 Family history of ischemic heart disease and other diseases of the circulatory system: Secondary | ICD-10-CM

## 2016-02-13 DIAGNOSIS — Z882 Allergy status to sulfonamides status: Secondary | ICD-10-CM

## 2016-02-13 DIAGNOSIS — Z87891 Personal history of nicotine dependence: Secondary | ICD-10-CM | POA: Diagnosis not present

## 2016-02-13 DIAGNOSIS — Z8679 Personal history of other diseases of the circulatory system: Secondary | ICD-10-CM | POA: Diagnosis not present

## 2016-02-13 DIAGNOSIS — J069 Acute upper respiratory infection, unspecified: Secondary | ICD-10-CM | POA: Diagnosis present

## 2016-02-13 DIAGNOSIS — R7989 Other specified abnormal findings of blood chemistry: Secondary | ICD-10-CM

## 2016-02-13 DIAGNOSIS — R748 Abnormal levels of other serum enzymes: Secondary | ICD-10-CM | POA: Diagnosis not present

## 2016-02-13 DIAGNOSIS — E119 Type 2 diabetes mellitus without complications: Secondary | ICD-10-CM | POA: Diagnosis present

## 2016-02-13 DIAGNOSIS — I471 Supraventricular tachycardia: Secondary | ICD-10-CM | POA: Diagnosis present

## 2016-02-13 DIAGNOSIS — R778 Other specified abnormalities of plasma proteins: Secondary | ICD-10-CM | POA: Diagnosis present

## 2016-02-13 DIAGNOSIS — E785 Hyperlipidemia, unspecified: Secondary | ICD-10-CM | POA: Diagnosis present

## 2016-02-13 DIAGNOSIS — F329 Major depressive disorder, single episode, unspecified: Secondary | ICD-10-CM | POA: Diagnosis present

## 2016-02-13 DIAGNOSIS — M6281 Muscle weakness (generalized): Secondary | ICD-10-CM

## 2016-02-13 DIAGNOSIS — K219 Gastro-esophageal reflux disease without esophagitis: Secondary | ICD-10-CM | POA: Diagnosis present

## 2016-02-13 DIAGNOSIS — R29898 Other symptoms and signs involving the musculoskeletal system: Secondary | ICD-10-CM

## 2016-02-13 DIAGNOSIS — I69354 Hemiplegia and hemiparesis following cerebral infarction affecting left non-dominant side: Secondary | ICD-10-CM | POA: Diagnosis not present

## 2016-02-13 DIAGNOSIS — E782 Mixed hyperlipidemia: Secondary | ICD-10-CM | POA: Diagnosis present

## 2016-02-13 DIAGNOSIS — R05 Cough: Secondary | ICD-10-CM | POA: Diagnosis not present

## 2016-02-13 DIAGNOSIS — R531 Weakness: Secondary | ICD-10-CM | POA: Diagnosis not present

## 2016-02-13 DIAGNOSIS — R260 Ataxic gait: Secondary | ICD-10-CM

## 2016-02-13 LAB — URINALYSIS, ROUTINE W REFLEX MICROSCOPIC
Bacteria, UA: NONE SEEN
Bilirubin Urine: NEGATIVE
Glucose, UA: >=500 mg/dL — AB
Ketones, ur: NEGATIVE mg/dL
Nitrite: NEGATIVE
Protein, ur: NEGATIVE mg/dL
Specific Gravity, Urine: 1.03 (ref 1.005–1.030)
pH: 6 (ref 5.0–8.0)

## 2016-02-13 LAB — CBC WITH DIFFERENTIAL/PLATELET
Basophils Absolute: 0 10*3/uL (ref 0.0–0.1)
Basophils Relative: 0 %
Eosinophils Absolute: 0 10*3/uL (ref 0.0–0.7)
Eosinophils Relative: 0 %
HCT: 41.8 % (ref 36.0–46.0)
Hemoglobin: 14.2 g/dL (ref 12.0–15.0)
Lymphocytes Relative: 10 %
Lymphs Abs: 1.1 10*3/uL (ref 0.7–4.0)
MCH: 31 pg (ref 26.0–34.0)
MCHC: 34 g/dL (ref 30.0–36.0)
MCV: 91.3 fL (ref 78.0–100.0)
Monocytes Absolute: 0.9 10*3/uL (ref 0.1–1.0)
Monocytes Relative: 8 %
Neutro Abs: 9 10*3/uL — ABNORMAL HIGH (ref 1.7–7.7)
Neutrophils Relative %: 82 %
Platelets: 226 10*3/uL (ref 150–400)
RBC: 4.58 MIL/uL (ref 3.87–5.11)
RDW: 12.9 % (ref 11.5–15.5)
WBC: 11.1 10*3/uL — ABNORMAL HIGH (ref 4.0–10.5)

## 2016-02-13 LAB — COMPREHENSIVE METABOLIC PANEL
ALT: 16 U/L (ref 14–54)
AST: 28 U/L (ref 15–41)
Albumin: 3.9 g/dL (ref 3.5–5.0)
Alkaline Phosphatase: 71 U/L (ref 38–126)
Anion gap: 12 (ref 5–15)
BUN: 15 mg/dL (ref 6–20)
CO2: 23 mmol/L (ref 22–32)
Calcium: 9.1 mg/dL (ref 8.9–10.3)
Chloride: 98 mmol/L — ABNORMAL LOW (ref 101–111)
Creatinine, Ser: 0.97 mg/dL (ref 0.44–1.00)
GFR calc Af Amer: >60 mL/min (ref >60)
GFR calc non Af Amer: 58 mL/min — ABNORMAL LOW (ref >60)
Glucose, Bld: 196 mg/dL — ABNORMAL HIGH (ref 65–99)
Potassium: 3.5 mmol/L (ref 3.5–5.1)
Sodium: 133 mmol/L — ABNORMAL LOW (ref 135–145)
Total Bilirubin: 0.6 mg/dL (ref 0.3–1.2)
Total Protein: 7.6 g/dL (ref 6.5–8.1)

## 2016-02-13 LAB — TSH: TSH: 0.538 u[IU]/mL (ref 0.350–4.500)

## 2016-02-13 LAB — PROTIME-INR
INR: 1.75
Prothrombin Time: 20.7 seconds — ABNORMAL HIGH (ref 11.4–15.2)

## 2016-02-13 LAB — TROPONIN I
Troponin I: 0.66 ng/mL (ref <0.03)
Troponin I: 0.78 ng/mL (ref <0.03)

## 2016-02-13 LAB — GLUCOSE, CAPILLARY
Glucose-Capillary: 145 mg/dL — ABNORMAL HIGH (ref 65–99)
Glucose-Capillary: 106 mg/dL — ABNORMAL HIGH (ref 65–99)

## 2016-02-13 LAB — CBG MONITORING, ED: Glucose-Capillary: 216 mg/dL — ABNORMAL HIGH (ref 65–99)

## 2016-02-13 LAB — I-STAT TROPONIN, ED: Troponin i, poc: 0.41 ng/mL (ref 0.00–0.08)

## 2016-02-13 LAB — MRSA PCR SCREENING: MRSA by PCR: NEGATIVE

## 2016-02-13 MED ORDER — INSULIN ASPART 100 UNIT/ML ~~LOC~~ SOLN
0.0000 [IU] | Freq: Three times a day (TID) | SUBCUTANEOUS | Status: DC
  Administered 2016-02-13: 3 [IU] via SUBCUTANEOUS
  Administered 2016-02-14 – 2016-02-15 (×2): 4 [IU] via SUBCUTANEOUS
  Administered 2016-02-15: 3 [IU] via SUBCUTANEOUS
  Administered 2016-02-16: 4 [IU] via SUBCUTANEOUS

## 2016-02-13 MED ORDER — FERROUS SULFATE 325 (65 FE) MG PO TABS
325.0000 mg | ORAL_TABLET | Freq: Every day | ORAL | Status: DC
  Administered 2016-02-14 – 2016-02-16 (×3): 325 mg via ORAL
  Filled 2016-02-13 (×3): qty 1

## 2016-02-13 MED ORDER — SODIUM CHLORIDE 0.9 % IV SOLN
1000.0000 mL | Freq: Once | INTRAVENOUS | Status: AC
  Administered 2016-02-13: 1000 mL via INTRAVENOUS

## 2016-02-13 MED ORDER — RISPERIDONE 1 MG PO TABS
2.0000 mg | ORAL_TABLET | Freq: Two times a day (BID) | ORAL | Status: DC
  Administered 2016-02-13 – 2016-02-16 (×6): 2 mg via ORAL
  Filled 2016-02-13 (×6): qty 2

## 2016-02-13 MED ORDER — METOPROLOL SUCCINATE ER 25 MG PO TB24
12.5000 mg | ORAL_TABLET | Freq: Two times a day (BID) | ORAL | Status: DC
  Administered 2016-02-13 – 2016-02-16 (×6): 12.5 mg via ORAL
  Filled 2016-02-13 (×6): qty 1

## 2016-02-13 MED ORDER — WARFARIN - PHARMACIST DOSING INPATIENT
Status: DC
  Administered 2016-02-13 – 2016-02-15 (×3)

## 2016-02-13 MED ORDER — SODIUM CHLORIDE 0.9% FLUSH
3.0000 mL | Freq: Two times a day (BID) | INTRAVENOUS | Status: DC
  Administered 2016-02-15 (×2): 3 mL via INTRAVENOUS

## 2016-02-13 MED ORDER — ZOLPIDEM TARTRATE 5 MG PO TABS
5.0000 mg | ORAL_TABLET | Freq: Every day | ORAL | Status: DC
  Administered 2016-02-13 – 2016-02-15 (×3): 5 mg via ORAL
  Filled 2016-02-13 (×3): qty 1

## 2016-02-13 MED ORDER — DOCUSATE SODIUM 100 MG PO CAPS: 100.0000 mg | ORAL_CAPSULE | Freq: Every day | ORAL | Status: DC | PRN

## 2016-02-13 MED ORDER — MIRTAZAPINE 30 MG PO TABS
30.0000 mg | ORAL_TABLET | Freq: Every day | ORAL | Status: DC
  Administered 2016-02-13 – 2016-02-15 (×3): 30 mg via ORAL
  Filled 2016-02-13 (×3): qty 1

## 2016-02-13 MED ORDER — LINACLOTIDE 145 MCG PO CAPS
290.0000 ug | ORAL_CAPSULE | Freq: Every day | ORAL | Status: DC
  Administered 2016-02-14 – 2016-02-16 (×3): 290 ug via ORAL
  Filled 2016-02-13 (×3): qty 2

## 2016-02-13 MED ORDER — CANAGLIFLOZIN 100 MG PO TABS
100.0000 mg | ORAL_TABLET | Freq: Every day | ORAL | Status: DC
  Administered 2016-02-14 – 2016-02-16 (×3): 100 mg via ORAL
  Filled 2016-02-13 (×5): qty 1

## 2016-02-13 MED ORDER — CALCIUM CARBONATE ANTACID 500 MG PO CHEW
1.0000 | CHEWABLE_TABLET | Freq: Four times a day (QID) | ORAL | Status: DC | PRN
  Administered 2016-02-16: 200 mg via ORAL

## 2016-02-13 MED ORDER — SIMVASTATIN 20 MG PO TABS
20.0000 mg | ORAL_TABLET | Freq: Every day | ORAL | Status: DC
  Administered 2016-02-13 – 2016-02-15 (×3): 20 mg via ORAL
  Filled 2016-02-13 (×3): qty 1

## 2016-02-13 MED ORDER — SODIUM CHLORIDE 0.9 % IV SOLN
1000.0000 mL | INTRAVENOUS | Status: DC
  Administered 2016-02-13 – 2016-02-15 (×6): 1000 mL via INTRAVENOUS

## 2016-02-13 MED ORDER — ASPIRIN 325 MG PO TABS
325.0000 mg | ORAL_TABLET | Freq: Once | ORAL | Status: AC
  Administered 2016-02-13: 325 mg via ORAL
  Filled 2016-02-13: qty 1

## 2016-02-13 MED ORDER — INSULIN ASPART 100 UNIT/ML ~~LOC~~ SOLN
10.0000 [IU] | Freq: Three times a day (TID) | SUBCUTANEOUS | Status: DC
  Administered 2016-02-13 – 2016-02-16 (×4): 10 [IU] via SUBCUTANEOUS

## 2016-02-13 MED ORDER — POLYVINYL ALCOHOL 1.4 % OP SOLN
1.0000 [drp] | Freq: Three times a day (TID) | OPHTHALMIC | Status: DC
  Administered 2016-02-14 – 2016-02-16 (×7): 1 [drp] via OPHTHALMIC
  Filled 2016-02-13 (×4): qty 15

## 2016-02-13 MED ORDER — ACETAMINOPHEN 325 MG PO TABS
650.0000 mg | ORAL_TABLET | Freq: Four times a day (QID) | ORAL | Status: DC | PRN
  Administered 2016-02-13 – 2016-02-16 (×10): 650 mg via ORAL
  Filled 2016-02-13 (×10): qty 2

## 2016-02-13 MED ORDER — TIZANIDINE HCL 4 MG PO TABS
2.0000 mg | ORAL_TABLET | Freq: Every day | ORAL | Status: DC
  Administered 2016-02-14 – 2016-02-16 (×3): 2 mg via ORAL
  Filled 2016-02-13 (×3): qty 1

## 2016-02-13 MED ORDER — INSULIN DETEMIR 100 UNIT/ML ~~LOC~~ SOLN
75.0000 [IU] | Freq: Every day | SUBCUTANEOUS | Status: DC
  Administered 2016-02-13 – 2016-02-14 (×2): 75 [IU] via SUBCUTANEOUS
  Filled 2016-02-13 (×4): qty 0.75

## 2016-02-13 MED ORDER — ARIPIPRAZOLE 2 MG PO TABS
2.0000 mg | ORAL_TABLET | Freq: Every day | ORAL | Status: DC
  Administered 2016-02-14 – 2016-02-16 (×3): 2 mg via ORAL
  Filled 2016-02-13 (×6): qty 1

## 2016-02-13 MED ORDER — DULOXETINE HCL 60 MG PO CPEP
120.0000 mg | ORAL_CAPSULE | Freq: Every day | ORAL | Status: DC
  Administered 2016-02-14 – 2016-02-16 (×3): 120 mg via ORAL
  Filled 2016-02-13 (×3): qty 2

## 2016-02-13 MED ORDER — VITAMIN D (ERGOCALCIFEROL) 1.25 MG (50000 UNIT) PO CAPS
50000.0000 [IU] | ORAL_CAPSULE | ORAL | Status: DC
  Filled 2016-02-13: qty 1

## 2016-02-13 MED ORDER — WARFARIN SODIUM 5 MG PO TABS
5.0000 mg | ORAL_TABLET | Freq: Once | ORAL | Status: AC
  Administered 2016-02-13: 5 mg via ORAL
  Filled 2016-02-13: qty 1

## 2016-02-13 MED ORDER — CLONAZEPAM 0.5 MG PO TABS
0.5000 mg | ORAL_TABLET | Freq: Three times a day (TID) | ORAL | Status: DC
  Administered 2016-02-13 – 2016-02-16 (×9): 0.5 mg via ORAL
  Filled 2016-02-13 (×9): qty 1

## 2016-02-13 MED ORDER — INSULIN ASPART 100 UNIT/ML ~~LOC~~ SOLN: 0.0000 [IU] | Freq: Every day | SUBCUTANEOUS | Status: DC

## 2016-02-13 MED ORDER — PANTOPRAZOLE SODIUM 40 MG PO TBEC
80.0000 mg | DELAYED_RELEASE_TABLET | Freq: Every day | ORAL | Status: DC
  Administered 2016-02-14 – 2016-02-16 (×3): 80 mg via ORAL
  Filled 2016-02-13 (×3): qty 2

## 2016-02-13 MED ORDER — DARIFENACIN HYDROBROMIDE ER 7.5 MG PO TB24
7.5000 mg | ORAL_TABLET | Freq: Every day | ORAL | Status: DC
  Administered 2016-02-14 – 2016-02-16 (×3): 7.5 mg via ORAL
  Filled 2016-02-13 (×3): qty 1

## 2016-02-13 MED ORDER — ENALAPRIL MALEATE 5 MG PO TABS
2.5000 mg | ORAL_TABLET | Freq: Every day | ORAL | Status: DC
  Administered 2016-02-14 – 2016-02-16 (×3): 2.5 mg via ORAL
  Filled 2016-02-13 (×3): qty 1

## 2016-02-13 MED ORDER — LATANOPROST 0.005 % OP SOLN
1.0000 [drp] | Freq: Every day | OPHTHALMIC | Status: DC
  Administered 2016-02-14 – 2016-02-15 (×2): 1 [drp] via OPHTHALMIC
  Filled 2016-02-13: qty 2.5

## 2016-02-13 NOTE — ED Triage Notes (Signed)
Pt also states "I think I have the flu,  I've been coughing"

## 2016-02-13 NOTE — ED Provider Notes (Signed)
[facility] DEPT Provider Note   CSN: [id number] Arrival date & time: 02/13/16  0931     History   Chief Complaint Chief Complaint  Patient presents with  . Altered Mental Status    HPI [name] is a 71 y.o. female.  HPI [name] is a 71 y.o. female with history of anxiety, depression, DVT, prior CVA with left-sided hemiplegia, usually walks with a walker, schizophrenia, presents to emergency department complaining of increased weakness to the left leg and flulike symptoms. She states she has been coughing, having generalized weakness, malaise for about a week. She states that difficulty walking started this morning when she woke up and realized she couldn't move left leg. She states she usually has some weakness on the left side from prior stroke, but able to move her leg and walk. She denies any fever or chills. No chest pain. No SOB. No headache. No other complaints. States "i think I have had another stroke and having a flu."  Past Medical History:  Diagnosis Date  . Anxiety   . Arthritis   . Depression    History of psychosis and previous suicide attempt  . DVT, lower extremity, recurrent (HCC)    Long-term Coumadin per Dr. Legrand Rams  . Essential hypertension   . GERD (gastroesophageal reflux disease)   . Hemiplegia ([place])   . Hemiplegia ([place]) 2010   left side  . History of stroke    Acute infarct and right cerebral white matter small vessel disease 12/10  . Leg DVT (deep venous thromboembolism), acute ([place]) 2006  . Schizophrenia ([place])   . Stroke ([place])   . Type 2 diabetes mellitus [facility])     Patient Active Problem List   Diagnosis Date Noted  . IDA (iron deficiency anemia) 04/06/2015  . Hyperlipidemia 11/06/2014  . Obesity due to excess calories 11/06/2014  . Sedentary lifestyle 11/06/2014  . Anemia 10/21/2014  . Rectal bleeding 10/21/2014  . Encounter for therapeutic drug monitoring 03/24/2013  . Chronic anticoagulation 04/09/2010  . Type 2  diabetes mellitus with vascular disease ([place]) 03/24/2010  . ESSENTIAL HYPERTENSION, BENIGN 03/24/2010  . DVT, lower extremity, recurrent ([place]) 03/24/2010  . CEREBROVASCULAR DISEASE, HX OF 03/24/2010    Past Surgical History:  Procedure Laterality Date  . BACK SURGERY    . BIOPSY N/A 11/24/2014   Procedure: BIOPSY;  Surgeon: Danie Binder, MD;  Location: [facility] ORS;  Service: Endoscopy;  Laterality: N/A;  . COLONOSCOPY WITH PROPOFOL N/A 11/24/2014   Dr. Rudie Meyer polyps removed/moderate sized internal hemorrhoids, tubular adenomas. Next surveillance in 3 years  . ESOPHAGOGASTRODUODENOSCOPY (EGD) WITH PROPOFOL N/A 11/24/2014   Dr. Clayburn Pert HH/patent stricture at the gastroesophageal junction, mild non-erosive gastritis, path negative for H.pylori or celiac sprue  . GIVENS CAPSULE STUDY N/A 12/11/2014   MULTILPLE EROSION IN the stomach WITH ACTIVE OOZING. OCCASIONAL EROSIONS AND RARE ULCER SEEN IN PROXIMAL SMALL BOWEL . No masses or AVMs SEEN. NO OLD BLOOD OR FRESH BLOOD SEEN.   . POLYPECTOMY N/A 11/24/2014   Procedure: POLYPECTOMY;  Surgeon: Danie Binder, MD;  Location: [facility];  Service: Endoscopy;  Laterality: N/A;    OB History    No data available       Home Medications    Prior to Admission medications   Medication Sig Start Date End Date Taking? Authorizing Provider  acetaminophen (TYLENOL) 325 MG tablet Take 650 mg by mouth every 6 (six) hours as needed. For fever > 101    Yes Historical  Provider, MD  ARIPiprazole (ABILIFY) 2 MG tablet  08/31/15  Yes Historical Provider, MD  calcium carbonate (TUMS - DOSED IN MG ELEMENTAL CALCIUM) 500 MG chewable tablet Chew 1 tablet by mouth 4 (four) times daily as needed for indigestion or heartburn.    Yes Historical Provider, MD  canagliflozin (INVOKANA) 100 MG TABS tablet Take 100 mg by mouth daily.   Yes Historical Provider, MD  clonazePAM (KLONOPIN) 0.5 MG tablet Take 0.5 mg by mouth 3 (three) times daily.  10/08/14  Yes Historical Provider,  MD  DULoxetine (CYMBALTA) 60 MG capsule Take 120 mg by mouth daily.    Yes Historical Provider, MD  enalapril (VASOTEC) 2.5 MG tablet  08/31/15  Yes Historical Provider, MD  ferrous sulfate (EQL SLOW RELEASE IRON) 160 (50 Fe) MG TBCR SR tablet Take 1 tablet (160 mg total) by mouth daily. 01/07/16  Yes Carlis Stable, NP  HYDROcodone-acetaminophen (NORCO) 5-325 MG per tablet Take 1 tablet by mouth 3 (three) times daily.    Yes Historical Provider, MD  latanoprost (XALATAN) 0.005 % ophthalmic solution Place 1 drop into both eyes at bedtime.   Yes Historical Provider, MD  LEVEMIR FLEXTOUCH 100 UNIT/ML Pen INJECT 100 UNITS SUBCUTANEOUSLY AT BEDTIME. 12/23/15  Yes Cassandria Anger, MD  LINZESS 290 MCG CAPS capsule TAKE 1 CAPSULE BY MOUTH EACH MORNING 30 MINUTES BEFORE BREAKFAST. 03/09/15  Yes Mahala Menghini, PA-C  loperamide (IMODIUM) 2 MG capsule Take 4 mg by mouth daily as needed for diarrhea or loose stools. Take 2 capsules initially then take 2 after each loose stool per [facility]   Yes Historical Provider, MD  metoprolol succinate (TOPROL-XL) 25 MG 24 hr tablet Take 12.5 mg by mouth 2 (two) times daily.    Yes Historical Provider, MD  mirtazapine (REMERON) 30 MG tablet Take 30 mg by mouth at bedtime.     Yes Historical Provider, MD  NOVOLOG FLEXPEN 100 UNIT/ML FlexPen SS: 251-300 ADD 3 UNITS; 301-350 ADD 4 UNITS; 351-400 ADD 5 UNITS; 401-450 ADD 6 UNITS AND CALL DOCTOR. 12/06/15  Yes Cassandria Anger, MD  NOVOLOG FLEXPEN 100 UNIT/ML FlexPen INJECT 35 UNITS SUBCUTANEOUSLY BEFORE MEALS IF BS IS BETWEEN 90-150. 150-200 ADD 1 UNIT , 201-250 ADD 2 UNITS 02/03/16  Yes Cassandria Anger, MD  omeprazole (PRILOSEC) 40 MG capsule Take 40 mg by mouth daily.   Yes Historical Provider, MD  polyvinyl alcohol (ARTIFICIAL TEARS) 1.4 % ophthalmic solution Place 1 drop into both eyes 3 (three) times daily.   Yes Historical Provider, MD  risperiDONE (RISPERDAL) 2 MG tablet Take 2 mg by mouth 2 (two) times daily.   Yes  Historical Provider, MD  simvastatin (ZOCOR) 20 MG tablet Take 20 mg by mouth at bedtime.   Yes Historical Provider, MD  solifenacin (VESICARE) 5 MG tablet Take 5 mg by mouth daily.   Yes Historical Provider, MD  tiZANidine (ZANAFLEX) 2 MG tablet Take 2 mg by mouth daily.   Yes Historical Provider, MD  Vitamin D, Ergocalciferol, (DRISDOL) 50000 UNITS CAPS capsule Take 50,000 Units by mouth every 7 (seven) days. Takes on Fridays.   Yes Historical Provider, MD  warfarin (COUMADIN) 5 MG tablet TAKE 1 TABLET BY MOUTH ONCE DAILY. 09/28/15  Yes Arnoldo Lenis, MD  zolpidem (AMBIEN) 10 MG tablet Take 10 mg by mouth at bedtime.   Yes Historical Provider, MD  docusate sodium (COLACE) 100 MG capsule Take 1 capsule (100 mg total) by mouth daily as needed for mild constipation. IF  LINZESS DOES NOT FACILITATE A BM ON ANY GIVEN DAY. Patient not taking: Reported on 02/13/2016 10/14/15   Annitta Needs, NP    Family History Family History  Problem Relation Age of Onset  . Colon cancer Neg Hx     Social History Social History  Substance Use Topics  . Smoking status: Former Smoker    Quit date: 01/24/1995  . Smokeless tobacco: Never Used  . Alcohol use No     Allergies   Sulfa antibiotics and Sulfonamide derivatives   Review of Systems Review of Systems  Constitutional: Positive for appetite change, chills and fatigue. Negative for fever.  HENT: Positive for congestion and sore throat.   Respiratory: Positive for cough. Negative for chest tightness and shortness of breath.   Cardiovascular: Negative for chest pain, palpitations and leg swelling.  Gastrointestinal: Negative for abdominal pain, diarrhea, nausea and vomiting.  Genitourinary: Negative for dysuria and flank pain.  Musculoskeletal: Negative for arthralgias, myalgias, neck pain and neck stiffness.  Skin: Negative for rash.  Neurological: Positive for weakness and numbness. Negative for dizziness and headaches.  All other systems reviewed  and are negative.    Physical Exam Updated Vital Signs BP 130/67 (BP Location: Left Arm)   Pulse 98   Temp 97.7 F (36.5 C) (Oral)   Resp 20   Ht 5\' 7"  (1.702 m)   Wt 77.1 kg   SpO2 95%   BMI 26.63 kg/m   Physical Exam  Constitutional: She is oriented to person, place, and time. She appears well-developed and well-nourished. No distress.  HENT:  Head: Normocephalic.  Right Ear: External ear normal.  Left Ear: External ear normal.  Nose: Nose normal.  Oral mucosa dry, oropharynx erythematous.   Eyes: Conjunctivae are normal.  Neck: Normal range of motion. Neck supple.  Cardiovascular: Normal rate, regular rhythm and normal heart sounds.   Pulmonary/Chest: Effort normal and breath sounds normal. No respiratory distress. She has no wheezes. She has no rales.  Abdominal: Soft. Bowel sounds are normal. She exhibits no distension. There is no tenderness. There is no rebound.  Musculoskeletal: She exhibits no edema.  Neurological: She is alert and oriented to person, place, and time.  5/5 and equal upper  extremity strength bilaterally. Equal grip strength bilaterally. Normal finger to nose and heel to shin. No pronator drift. Patellar reflexes 2+. Unable to left leg off stretcher. Unable to move foot or toes. Decreased sensation to left leg   Skin: Skin is warm and dry.  Psychiatric: She has a normal mood and affect. Her behavior is normal.  Nursing note and vitals reviewed.    ED Treatments / Results  Labs (all labs ordered are listed, but only abnormal results are displayed) Labs Reviewed  CBC WITH DIFFERENTIAL/PLATELET - Abnormal; Notable for the following:       Result Value   WBC 11.1 (*)    Neutro Abs 9.0 (*)    All other components within normal limits  COMPREHENSIVE METABOLIC PANEL - Abnormal; Notable for the following:    Sodium 133 (*)    Chloride 98 (*)    Glucose, Bld 196 (*)    GFR calc non Af Amer 58 (*)    All other components within normal limits    PROTIME-INR - Abnormal; Notable for the following:    Prothrombin Time 20.7 (*)    All other components within normal limits  CBG MONITORING, ED - Abnormal; Notable for the following:    Glucose-Capillary 216 (*)  All other components within normal limits  I-STAT TROPOININ, ED - Abnormal; Notable for the following:    Troponin i, poc 0.41 (*)    All other components within normal limits  URINALYSIS, ROUTINE W REFLEX MICROSCOPIC    EKG  EKG Interpretation None       Radiology Dg Chest 2 View  Result Date: 02/13/2016 CLINICAL DATA:  Confusion and difficulty walking.  Coughing. EXAM: CHEST  2 VIEW COMPARISON:  May 25, 2011 FINDINGS: The study is limited due to patient positioning. However, taking this into account, the heart, hila, mediastinum, lungs, and pleura are unremarkable. A nodule projected over lower thoracic vertebral body on the lateral view is unchanged since 2013 of no significance. IMPRESSION: No active cardiopulmonary disease. Electronically Signed   By: Dorise Bullion III M.D   On: 02/13/2016 11:06   Ct Head Wo Contrast  Result Date: 02/13/2016 CLINICAL DATA:  Difficulty walking since last night. Left-sided hemiplegia. EXAM: CT HEAD WITHOUT CONTRAST TECHNIQUE: Contiguous axial images were obtained from the base of the skull through the vertex without intravenous contrast. COMPARISON:  December 10, 2014 FINDINGS: Brain: No subdural, epidural, or subarachnoid hemorrhage. Ventricles and sulci are stable. Cerebellum, brainstem, and basal cisterns are normal. White matter changes and a few scattered lacunar infarcts are stable. No acute cortical ischemia or infarct. No mass, mass effect, or midline shift. Vascular: No hyperdense vessel or unexpected calcification. Skull: Normal. Negative for fracture or focal lesion. Sinuses/Orbits: Previous sinus surgery. Mastoid air cells and middle ears are unchanged unremarkable. Other: None. IMPRESSION: 1. No acute intracranial process to  explain the patient's symptoms. 2. Chronic white matter changes. Electronically Signed   By: Dorise Bullion III M.D   On: 02/13/2016 11:27    Procedures Procedures (including critical care time)  Medications Ordered in ED Medications  0.9 %  sodium chloride infusion (1,000 mLs Intravenous New Bag/Given 02/13/16 1024)    Followed by  0.9 %  sodium chloride infusion (1,000 mLs Intravenous New Bag/Given 02/13/16 1025)  aspirin tablet 325 mg (325 mg Oral Given 02/13/16 1112)     Initial Impression / Assessment and Plan / ED Course  I have reviewed the triage vital signs and the nursing notes.  Pertinent labs & imaging results that were available during my care of the patient were reviewed by me and considered in my medical decision making (see chart for details).  Patient in emergency department with left leg weakness. On exam, patient unable to lift her leg off the bed or move her toes. No other neuro deficits on exam. She was complaining of viral URI symptoms for a week and cough. Her oral mucosa is very dry. We'll give oral fluids and IV fluids. Blood work and chest x-ray ordered. CT head ordered.     12:24 PM Patient's left leg weakness improved. She is now able to move it. Troponin is elevated. She denies any chest pain at this time. EKG did not show any ST changes. Will admit for further workup.  Vitals:   02/13/16 1400 02/13/16 1458  BP: 128/64 (!) 112/52  Pulse: 100 95  Resp: 17 18  Temp:  99.5 F (37.5 C)     Final Clinical Impressions(s) / ED Diagnoses   Final diagnoses:  Elevated troponin  Upper respiratory tract infection, unspecified type  Left leg weakness    New Prescriptions Current Discharge Medication List       Jeannett Senior, PA-C 02/13/16 [place], MD 02/16/16 [phone]

## 2016-02-13 NOTE — ED Notes (Signed)
Put pt on bedpan she is unable to void at this time removed bedpan

## 2016-02-13 NOTE — ED Notes (Signed)
CRITICAL VALUE ALERT  Critical value received:  Troponin 0.41  Date of notification:  02/13/2016  Time of notification:  1100  Critical value read back:Yes.    Nurse who received alert:  Norm Salt, RN  MD notified (1st page):  Jeannett Senior, PA  Time of first page:  1100  MD notified (2nd page):  Time of second page:  Responding MD:  Idell Pickles, PA  Time MD responded:  1100

## 2016-02-13 NOTE — H&P (Signed)
History and Physical    [name] [id number] DOB: [birth year] DOA: 02/13/2016  PCP: Courtney Fire, MD  Patient coming from: SNF   Chief Complaint:  Can't move left leg.   HPI: [name] is an 71 y.o. female with hx of prior CVA with left hemiparesis, hx of anxiety, schizophrenia, HTN, hx of depression and psychosis, GERD, Hx of DVT on anticoagulation with Coumadin, DM, presented to the ER saying that she can't move her left leg.  She has no other neurological deficit.  Subsequently, she was able to move it.  On closer questioning, she said it was not a new phenomenon, she has trouble moving her leg because she has "knee arthritis" and it happens "quite frequently"  In the ER, she had head CT negative for any acute process.  Further work up included an Istat troponin of 0.41, with negative EKG and normal renal Fx tests.  Hospitalist was asked to admit her for MRI of the head, and to have her r/out.   ED Course:  See above.  Rewiew of Systems:  Constitutional: Negative for malaise, fever and chills. No significant weight loss or weight gain Eyes: Negative for eye pain, redness and discharge, diplopia, visual changes, or flashes of light. ENMT: Negative for ear pain, hoarseness, nasal congestion, sinus pressure and sore throat. No headaches; tinnitus, drooling, or problem swallowing. Cardiovascular: Negative for chest pain, palpitations, diaphoresis, dyspnea and peripheral edema. ; No orthopnea, PND Respiratory: Negative for cough, hemoptysis, wheezing and stridor. No pleuritic chestpain. Gastrointestinal: Negative for diarrhea, constipation,  melena, blood in stool, hematemesis, jaundice and rectal bleeding.    Genitourinary: Negative for frequency, dysuria, incontinence,flank pain and hematuria; Musculoskeletal: Negative for back pain and neck pain. Negative for swelling and trauma.;  Skin: . Negative for pruritus, rash, abrasions, bruising and skin lesion.; ulcerations Neuro:  Negative for headache, lightheadedness and neck stiffness. Negative for weakness, altered level of consciousness , altered mental status, burning feet, involuntary movement, seizure and syncope.  Psych: negative for anxiety, depression, insomnia, tearfulness, panic attacks, hallucinations, paranoia, suicidal or homicidal ideation    Past Medical History:  Diagnosis Date  . Anxiety   . Arthritis   . Depression    History of psychosis and previous suicide attempt  . DVT, lower extremity, recurrent (HCC)    Long-term Coumadin per Dr. Legrand Rams  . Essential hypertension   . GERD (gastroesophageal reflux disease)   . Hemiplegia ([place])   . Hemiplegia ([place]) 2010   left side  . History of stroke    Acute infarct and right cerebral white matter small vessel disease 12/10  . Leg DVT (deep venous thromboembolism), acute ([place]) 2006  . Schizophrenia ([place])   . Stroke ([place])   . Type 2 diabetes mellitus ([place])     Rewiew of Systems:    Past Surgical History:  Procedure Laterality Date  . BACK SURGERY    . BIOPSY N/A 11/24/2014   Procedure: BIOPSY;  Surgeon: Danie Binder, MD;  Location: [facility];  Service: Endoscopy;  Laterality: N/A;  . COLONOSCOPY WITH PROPOFOL N/A 11/24/2014   Dr. Rudie Meyer polyps removed/moderate sized internal hemorrhoids, tubular adenomas. Next surveillance in 3 years  . ESOPHAGOGASTRODUODENOSCOPY (EGD) WITH PROPOFOL N/A 11/24/2014   Dr. Clayburn Pert HH/patent stricture at the gastroesophageal junction, mild non-erosive gastritis, path negative for H.pylori or celiac sprue  . GIVENS CAPSULE STUDY N/A 12/11/2014   MULTILPLE EROSION IN the stomach WITH ACTIVE OOZING. OCCASIONAL EROSIONS AND RARE ULCER SEEN IN PROXIMAL SMALL BOWEL .  No masses or AVMs SEEN. NO OLD BLOOD OR FRESH BLOOD SEEN.   . POLYPECTOMY N/A 11/24/2014   Procedure: POLYPECTOMY;  Surgeon: Danie Binder, MD;  Location: [facility];  Service: Endoscopy;  Laterality: N/A;     reports that she quit smoking about 21 years  ago. She has never used smokeless tobacco. She reports that she does not drink alcohol or use drugs.  Allergies  Allergen Reactions  . Sulfa Antibiotics Rash  . Sulfonamide Derivatives Rash    REACTION: rash    Family History  Problem Relation Age of Onset  . Colon cancer Neg Hx      Prior to Admission medications   Medication Sig Start Date End Date Taking? Authorizing Provider  acetaminophen (TYLENOL) 325 MG tablet Take 650 mg by mouth every 6 (six) hours as needed. For fever > 101    Yes Historical Provider, MD  ARIPiprazole (ABILIFY) 2 MG tablet  08/31/15  Yes Historical Provider, MD  calcium carbonate (TUMS - DOSED IN MG ELEMENTAL CALCIUM) 500 MG chewable tablet Chew 1 tablet by mouth 4 (four) times daily as needed for indigestion or heartburn.    Yes Historical Provider, MD  canagliflozin (INVOKANA) 100 MG TABS tablet Take 100 mg by mouth daily.   Yes Historical Provider, MD  clonazePAM (KLONOPIN) 0.5 MG tablet Take 0.5 mg by mouth 3 (three) times daily.  10/08/14  Yes Historical Provider, MD  DULoxetine (CYMBALTA) 60 MG capsule Take 120 mg by mouth daily.    Yes Historical Provider, MD  enalapril (VASOTEC) 2.5 MG tablet  08/31/15  Yes Historical Provider, MD  ferrous sulfate (EQL SLOW RELEASE IRON) 160 (50 Fe) MG TBCR SR tablet Take 1 tablet (160 mg total) by mouth daily. 01/07/16  Yes Carlis Stable, NP  HYDROcodone-acetaminophen (NORCO) 5-325 MG per tablet Take 1 tablet by mouth 3 (three) times daily.    Yes Historical Provider, MD  latanoprost (XALATAN) 0.005 % ophthalmic solution Place 1 drop into both eyes at bedtime.   Yes Historical Provider, MD  LEVEMIR FLEXTOUCH 100 UNIT/ML Pen INJECT 100 UNITS SUBCUTANEOUSLY AT BEDTIME. 12/23/15  Yes Cassandria Anger, MD  LINZESS 290 MCG CAPS capsule TAKE 1 CAPSULE BY MOUTH EACH MORNING 30 MINUTES BEFORE BREAKFAST. 03/09/15  Yes Mahala Menghini, PA-C  loperamide (IMODIUM) 2 MG capsule Take 4 mg by mouth daily as needed for diarrhea or loose  stools. Take 2 capsules initially then take 2 after each loose stool per [facility]   Yes Historical Provider, MD  metoprolol succinate (TOPROL-XL) 25 MG 24 hr tablet Take 12.5 mg by mouth 2 (two) times daily.    Yes Historical Provider, MD  mirtazapine (REMERON) 30 MG tablet Take 30 mg by mouth at bedtime.     Yes Historical Provider, MD  NOVOLOG FLEXPEN 100 UNIT/ML FlexPen SS: 251-300 ADD 3 UNITS; 301-350 ADD 4 UNITS; 351-400 ADD 5 UNITS; 401-450 ADD 6 UNITS AND CALL DOCTOR. 12/06/15  Yes Cassandria Anger, MD  NOVOLOG FLEXPEN 100 UNIT/ML FlexPen INJECT 35 UNITS SUBCUTANEOUSLY BEFORE MEALS IF BS IS BETWEEN 90-150. 150-200 ADD 1 UNIT , 201-250 ADD 2 UNITS 02/03/16  Yes Cassandria Anger, MD  omeprazole (PRILOSEC) 40 MG capsule Take 40 mg by mouth daily.   Yes Historical Provider, MD  polyvinyl alcohol (ARTIFICIAL TEARS) 1.4 % ophthalmic solution Place 1 drop into both eyes 3 (three) times daily.   Yes Historical Provider, MD  risperiDONE (RISPERDAL) 2 MG tablet Take 2 mg by mouth 2 (two)  times daily.   Yes Historical Provider, MD  simvastatin (ZOCOR) 20 MG tablet Take 20 mg by mouth at bedtime.   Yes Historical Provider, MD  solifenacin (VESICARE) 5 MG tablet Take 5 mg by mouth daily.   Yes Historical Provider, MD  tiZANidine (ZANAFLEX) 2 MG tablet Take 2 mg by mouth daily.   Yes Historical Provider, MD  Vitamin D, Ergocalciferol, (DRISDOL) 50000 UNITS CAPS capsule Take 50,000 Units by mouth every 7 (seven) days. Takes on Fridays.   Yes Historical Provider, MD  warfarin (COUMADIN) 5 MG tablet TAKE 1 TABLET BY MOUTH ONCE DAILY. 09/28/15  Yes Arnoldo Lenis, MD  zolpidem (AMBIEN) 10 MG tablet Take 10 mg by mouth at bedtime.   Yes Historical Provider, MD  docusate sodium (COLACE) 100 MG capsule Take 1 capsule (100 mg total) by mouth daily as needed for mild constipation. IF LINZESS DOES NOT FACILITATE A BM ON ANY GIVEN DAY. Patient not taking: Reported on 02/13/2016 10/14/15   Annitta Needs, NP     Physical Exam: Vitals:   02/13/16 1128 02/13/16 1130 02/13/16 1200 02/13/16 1230  BP: 130/67 (!) 123/54 115/69 (!) 119/52  Pulse: 98 99 99 95  Resp: 20 20 21  (!) 28  Temp:      TempSrc:      SpO2: 95% 96% 94% 95%  Weight:      Height:          Constitutional: NAD, calm, comfortable Vitals:   02/13/16 1128 02/13/16 1130 02/13/16 1200 02/13/16 1230  BP: 130/67 (!) 123/54 115/69 (!) 119/52  Pulse: 98 99 99 95  Resp: 20 20 21  (!) 28  Temp:      TempSrc:      SpO2: 95% 96% 94% 95%  Weight:      Height:       Eyes: PERRL, lids and conjunctivae normal ENMT: Mucous membranes are moist. Posterior pharynx clear of any exudate or lesions.Normal dentition.  Neck: normal, supple, no masses, no thyromegaly Respiratory: clear to auscultation bilaterally, no wheezing, no crackles. Normal respiratory effort. No accessory muscle use.  Cardiovascular: Regular rate and rhythm, no murmurs / rubs / gallops. No extremity edema. 2+ pedal pulses. No carotid bruits.  Abdomen: no tenderness, no masses palpated. No hepatosplenomegaly. Bowel sounds positive.  Musculoskeletal: no clubbing / cyanosis. No joint deformity upper and lower extremities. Good ROM, no contractures. Normal muscle tone.  Skin: no rashes, lesions, ulcers. No induration Neurologic: CN 2-12 grossly intact. Sensation intact, DTR normal. Strength 5/5 in all 4.  Psychiatric: Normal judgment and insight. Alert and oriented x 3. Normal mood.     Labs on Admission: I have personally reviewed following labs and imaging studies  CBC:  Recent Labs Lab 02/13/16 0946  WBC 11.1*  NEUTROABS 9.0*  HGB 14.2  HCT 41.8  MCV 91.3  PLT [id number]   Basic Metabolic Panel:  Recent Labs Lab 02/13/16 0946  NA 133*  K 3.5  CL 98*  CO2 23  GLUCOSE 196*  BUN 15  CREATININE 0.97  CALCIUM 9.1   Liver Function Tests:  Recent Labs Lab 02/13/16 0946  AST 28  ALT 16  ALKPHOS 71  BILITOT 0.6  PROT 7.6  ALBUMIN 3.9   Coagulation  Profile:  Recent Labs Lab 02/13/16 0946  INR 1.75   CBG:  Recent Labs Lab 02/13/16 0949  GLUCAP 216*   Lipid Profile: Urine analysis:    Component Value Date/Time   COLORURINE YELLOW 02/13/2016 [place]  CLEAR 02/13/2016 1011   LABSPEC 1.030 02/13/2016 1011   PHURINE 6.0 02/13/2016 1011   GLUCOSEU >=500 (A) 02/13/2016 1011   HGBUR MODERATE (A) 02/13/2016 1011   BILIRUBINUR NEGATIVE 02/13/2016 1011   KETONESUR NEGATIVE 02/13/2016 1011   PROTEINUR NEGATIVE 02/13/2016 1011   UROBILINOGEN 0.2 09/14/2014 1703   NITRITE NEGATIVE 02/13/2016 1011   LEUKOCYTESUR TRACE (A) 02/13/2016 1011    Radiological Exams on Admission: Dg Chest 2 View  Result Date: 02/13/2016 CLINICAL DATA:  Confusion and difficulty walking.  Coughing. EXAM: CHEST  2 VIEW COMPARISON:  May 25, 2011 FINDINGS: The study is limited due to patient positioning. However, taking this into account, the heart, hila, mediastinum, lungs, and pleura are unremarkable. A nodule projected over lower thoracic vertebral body on the lateral view is unchanged since 2013 of no significance. IMPRESSION: No active cardiopulmonary disease. Electronically Signed   By: Dorise Bullion III M.D   On: 02/13/2016 11:06   Ct Head Wo Contrast  Result Date: 02/13/2016 CLINICAL DATA:  Difficulty walking since last night. Left-sided hemiplegia. EXAM: CT HEAD WITHOUT CONTRAST TECHNIQUE: Contiguous axial images were obtained from the base of the skull through the vertex without intravenous contrast. COMPARISON:  December 10, 2014 FINDINGS: Brain: No subdural, epidural, or subarachnoid hemorrhage. Ventricles and sulci are stable. Cerebellum, brainstem, and basal cisterns are normal. White matter changes and a few scattered lacunar infarcts are stable. No acute cortical ischemia or infarct. No mass, mass effect, or midline shift. Vascular: No hyperdense vessel or unexpected calcification. Skull: Normal. Negative for fracture or focal lesion.  Sinuses/Orbits: Previous sinus surgery. Mastoid air cells and middle ears are unchanged unremarkable. Other: None. IMPRESSION: 1. No acute intracranial process to explain the patient's symptoms. 2. Chronic white matter changes. Electronically Signed   By: Dorise Bullion III M.D   On: 02/13/2016 11:27    EKG: Independently reviewed.   Assessment/Plan Active Problems:   Type 2 diabetes mellitus with vascular disease (HCC)   Essential hypertension, benign   DVT, lower extremity, recurrent (HCC)   History of cardiovascular disorder   Chronic anticoagulation   Hyperlipidemia   Elevated troponin    PLAN:   Left lower extremity paralysis:  Transient and recurrent.  I don't think she has had recurrent TIA, and if it is a TIA, it would involve the internal capsule.  Given she has already been on anticoagulation, it is unlikely to make any therapeutic difference with an MRI, so will defer it.  She has this symptoms several times per week.  Will follow her clinically.  Elevated troponins:  This was from [place].  No clinical symtoms of ACS.  Will cycle troponins.     Hx of recurrent DVT:  Will continue with Coumadin per pharmacy dosing.  Her current INR is 1.75.     DM: She is on large doses of insulin.  Will reduce from 100 units of Levemir to 75 units, reduce meal time coverages, and use SSI resistant.  Will place on carb modified diet.    DVT prophylaxis: Coumadin.  Code Status: FULL CODE.  Family Communication: None.  Disposition Plan: To SNF.  Consults called: None. Admission status: FULL Haskel Khan MD FACP. [facility]  If 7PM-7AM, please contact night-coverage [email or web address] Password [email or web address]  02/13/2016, 1:40 PM

## 2016-02-13 NOTE — Progress Notes (Signed)
ANTICOAGULATION CONSULT NOTE - Initial Consult  Pharmacy Consult for Warfarin Indication: VTE Treatment  Allergies  Allergen Reactions  . Sulfa Antibiotics Rash  . Sulfonamide Derivatives Rash    REACTION: rash    Patient Measurements: Height: 5\' 4"  (162.6 cm) Weight: 169 lb (76.7 kg) IBW/kg (Calculated) : 54.7 Heparin Dosing Weight:   Vital Signs: Temp: 99.5 F (37.5 C) (01/21 1458) Temp Source: Oral (01/21 1458) BP: 112/52 (01/21 1458) Pulse Rate: 95 (01/21 1458)  Labs:  Recent Labs  02/13/16 0946  HGB 14.2  HCT 41.8  PLT 226  LABPROT 20.7*  INR 1.75  CREATININE 0.97    Estimated Creatinine Clearance: 54.1 mL/min (by C-G formula based on SCr of 0.97 mg/dL).   Medical History: Past Medical History:  Diagnosis Date  . Anxiety   . Arthritis   . Depression    History of psychosis and previous suicide attempt  . DVT, lower extremity, recurrent (HCC)    Long-term Coumadin per Dr. Legrand Rams  . Essential hypertension   . GERD (gastroesophageal reflux disease)   . Hemiplegia ([place])   . Hemiplegia ([place]) 2010   left side  . History of stroke    Acute infarct and right cerebral white matter small vessel disease 12/10  . Leg DVT (deep venous thromboembolism), acute ([place]) 2006  . Schizophrenia ([place])   . Stroke ([place])   . Type 2 diabetes mellitus (HCC)     Medications:  Prescriptions Prior to Admission  Medication Sig Dispense Refill Last Dose  . acetaminophen (TYLENOL) 325 MG tablet Take 650 mg by mouth every 6 (six) hours as needed. For fever > 101    unknown  . ARIPiprazole (ABILIFY) 2 MG tablet    02/12/2016 at Unknown time  . calcium carbonate (TUMS - DOSED IN MG ELEMENTAL CALCIUM) 500 MG chewable tablet Chew 1 tablet by mouth 4 (four) times daily as needed for indigestion or heartburn.    unknown  . canagliflozin (INVOKANA) 100 MG TABS tablet Take 100 mg by mouth daily.   02/13/2016 at Unknown time  . clonazePAM (KLONOPIN) 0.5 MG tablet Take 0.5 mg by mouth 3  (three) times daily.    02/13/2016 at Unknown time  . DULoxetine (CYMBALTA) 60 MG capsule Take 120 mg by mouth daily.    02/13/2016 at Unknown time  . enalapril (VASOTEC) 2.5 MG tablet    02/13/2016 at Unknown time  . ferrous sulfate (EQL SLOW RELEASE IRON) 160 (50 Fe) MG TBCR SR tablet Take 1 tablet (160 mg total) by mouth daily. 30 each 11 02/12/2016 at Unknown time  . HYDROcodone-acetaminophen (NORCO) 5-325 MG per tablet Take 1 tablet by mouth 3 (three) times daily.    02/13/2016 at Unknown time  . latanoprost (XALATAN) 0.005 % ophthalmic solution Place 1 drop into both eyes at bedtime.   02/12/2016 at Unknown time  . LEVEMIR FLEXTOUCH 100 UNIT/ML Pen INJECT 100 UNITS SUBCUTANEOUSLY AT BEDTIME. 30 mL 2 02/12/2016 at Unknown time  . LINZESS 290 MCG CAPS capsule TAKE 1 CAPSULE BY MOUTH EACH MORNING 30 MINUTES BEFORE BREAKFAST. 30 capsule 11 02/13/2016 at Unknown time  . loperamide (IMODIUM) 2 MG capsule Take 4 mg by mouth daily as needed for diarrhea or loose stools. Take 2 capsules initially then take 2 after each loose stool per MAR   unknown  . metoprolol succinate (TOPROL-XL) 25 MG 24 hr tablet Take 12.5 mg by mouth 2 (two) times daily.    02/13/2016 at 0800  . mirtazapine (REMERON) 30 MG  tablet Take 30 mg by mouth at bedtime.     02/12/2016 at Unknown time  . NOVOLOG FLEXPEN 100 UNIT/ML FlexPen SS: 251-300 ADD 3 UNITS; 301-350 ADD 4 UNITS; 351-400 ADD 5 UNITS; 401-450 ADD 6 UNITS AND CALL DOCTOR. 15 mL 2 02/13/2016 at Unknown time  . NOVOLOG FLEXPEN 100 UNIT/ML FlexPen INJECT 35 UNITS SUBCUTANEOUSLY BEFORE MEALS IF BS IS BETWEEN 90-150. 150-200 ADD 1 UNIT , 201-250 ADD 2 UNITS 15 mL 2 02/13/2016 at Unknown time  . omeprazole (PRILOSEC) 40 MG capsule Take 40 mg by mouth daily.   02/13/2016 at Unknown time  . polyvinyl alcohol (ARTIFICIAL TEARS) 1.4 % ophthalmic solution Place 1 drop into both eyes 3 (three) times daily.   02/13/2016 at Unknown time  . risperiDONE (RISPERDAL) 2 MG tablet Take 2 mg by mouth 2  (two) times daily.   02/13/2016 at Unknown time  . simvastatin (ZOCOR) 20 MG tablet Take 20 mg by mouth at bedtime.   02/12/2016 at Unknown time  . solifenacin (VESICARE) 5 MG tablet Take 5 mg by mouth daily.   02/13/2016 at Unknown time  . tiZANidine (ZANAFLEX) 2 MG tablet Take 2 mg by mouth daily.   02/12/2016 at Unknown time  . Vitamin D, Ergocalciferol, (DRISDOL) 50000 UNITS CAPS capsule Take 50,000 Units by mouth every 7 (seven) days. Takes on Fridays.   Past Week at Unknown time  . warfarin (COUMADIN) 5 MG tablet TAKE 1 TABLET BY MOUTH ONCE DAILY. 30 tablet 6 02/12/2016 at 0800  . zolpidem (AMBIEN) 10 MG tablet Take 10 mg by mouth at bedtime.   02/12/2016 at Unknown time  . docusate sodium (COLACE) 100 MG capsule Take 1 capsule (100 mg total) by mouth daily as needed for mild constipation. IF LINZESS DOES NOT FACILITATE A BM ON ANY GIVEN DAY. (Patient not taking: Reported on 02/13/2016) 30 capsule 3 Not Taking at Unknown time    Assessment: Continuation of warfarin PTA  Goal of Therapy:  INR 2-3 Monitor platelets by anticoagulation protocol: Yes   Plan:  Warfarin 5 mg po today INR/PT daily  Abner Greenspan, Marieelena Bartko Bennett 02/13/2016,3:03 PM

## 2016-02-13 NOTE — Progress Notes (Signed)
CRITICAL VALUE ALERT  Critical value received:  Trop [id number]  Date of notification:  02/13/2016  Time of notification:  [id number]  Critical value read back:Yes.    Nurse who received alert: Barbra Sarks, RN  MD notified (1st page): DonDiego (on call for [place])  Time of first page:  [phone]

## 2016-02-13 NOTE — ED Triage Notes (Signed)
Confusion and difficulty walking since last night

## 2016-02-13 NOTE — ED Notes (Signed)
EKG given to Dr. Campos 

## 2016-02-14 ENCOUNTER — Encounter (HOSPITAL_COMMUNITY): Payer: Self-pay | Admitting: Cardiology

## 2016-02-14 DIAGNOSIS — E1151 Type 2 diabetes mellitus with diabetic peripheral angiopathy without gangrene: Secondary | ICD-10-CM | POA: Diagnosis present

## 2016-02-14 DIAGNOSIS — R4182 Altered mental status, unspecified: Secondary | ICD-10-CM | POA: Diagnosis not present

## 2016-02-14 DIAGNOSIS — E785 Hyperlipidemia, unspecified: Secondary | ICD-10-CM | POA: Diagnosis present

## 2016-02-14 DIAGNOSIS — F329 Major depressive disorder, single episode, unspecified: Secondary | ICD-10-CM | POA: Diagnosis present

## 2016-02-14 DIAGNOSIS — F419 Anxiety disorder, unspecified: Secondary | ICD-10-CM | POA: Diagnosis present

## 2016-02-14 DIAGNOSIS — I69354 Hemiplegia and hemiparesis following cerebral infarction affecting left non-dominant side: Secondary | ICD-10-CM | POA: Diagnosis not present

## 2016-02-14 DIAGNOSIS — Z86718 Personal history of other venous thrombosis and embolism: Secondary | ICD-10-CM | POA: Diagnosis not present

## 2016-02-14 DIAGNOSIS — I10 Essential (primary) hypertension: Secondary | ICD-10-CM | POA: Diagnosis present

## 2016-02-14 DIAGNOSIS — R42 Dizziness and giddiness: Secondary | ICD-10-CM | POA: Diagnosis not present

## 2016-02-14 DIAGNOSIS — I471 Supraventricular tachycardia: Secondary | ICD-10-CM | POA: Diagnosis not present

## 2016-02-14 DIAGNOSIS — M1990 Unspecified osteoarthritis, unspecified site: Secondary | ICD-10-CM | POA: Diagnosis present

## 2016-02-14 DIAGNOSIS — Z7901 Long term (current) use of anticoagulants: Secondary | ICD-10-CM | POA: Diagnosis not present

## 2016-02-14 DIAGNOSIS — K219 Gastro-esophageal reflux disease without esophagitis: Secondary | ICD-10-CM | POA: Diagnosis present

## 2016-02-14 DIAGNOSIS — Z8249 Family history of ischemic heart disease and other diseases of the circulatory system: Secondary | ICD-10-CM | POA: Diagnosis not present

## 2016-02-14 DIAGNOSIS — R748 Abnormal levels of other serum enzymes: Secondary | ICD-10-CM | POA: Diagnosis not present

## 2016-02-14 DIAGNOSIS — R55 Syncope and collapse: Secondary | ICD-10-CM | POA: Diagnosis not present

## 2016-02-14 DIAGNOSIS — G8310 Monoplegia of lower limb affecting unspecified side: Secondary | ICD-10-CM | POA: Diagnosis not present

## 2016-02-14 DIAGNOSIS — I82401 Acute embolism and thrombosis of unspecified deep veins of right lower extremity: Secondary | ICD-10-CM | POA: Diagnosis not present

## 2016-02-14 DIAGNOSIS — I82409 Acute embolism and thrombosis of unspecified deep veins of unspecified lower extremity: Secondary | ICD-10-CM | POA: Diagnosis not present

## 2016-02-14 DIAGNOSIS — J069 Acute upper respiratory infection, unspecified: Secondary | ICD-10-CM | POA: Diagnosis present

## 2016-02-14 DIAGNOSIS — E1165 Type 2 diabetes mellitus with hyperglycemia: Secondary | ICD-10-CM | POA: Diagnosis not present

## 2016-02-14 DIAGNOSIS — Z882 Allergy status to sulfonamides status: Secondary | ICD-10-CM | POA: Diagnosis not present

## 2016-02-14 DIAGNOSIS — Z794 Long term (current) use of insulin: Secondary | ICD-10-CM | POA: Diagnosis not present

## 2016-02-14 DIAGNOSIS — D509 Iron deficiency anemia, unspecified: Secondary | ICD-10-CM | POA: Diagnosis present

## 2016-02-14 DIAGNOSIS — Z87891 Personal history of nicotine dependence: Secondary | ICD-10-CM | POA: Diagnosis not present

## 2016-02-14 DIAGNOSIS — Z915 Personal history of self-harm: Secondary | ICD-10-CM | POA: Diagnosis not present

## 2016-02-14 DIAGNOSIS — F209 Schizophrenia, unspecified: Secondary | ICD-10-CM | POA: Diagnosis present

## 2016-02-14 DIAGNOSIS — I248 Other forms of acute ischemic heart disease: Secondary | ICD-10-CM | POA: Diagnosis present

## 2016-02-14 LAB — GLUCOSE, CAPILLARY
Glucose-Capillary: 77 mg/dL (ref 65–99)
Glucose-Capillary: 168 mg/dL — ABNORMAL HIGH (ref 65–99)
Glucose-Capillary: 104 mg/dL — ABNORMAL HIGH (ref 65–99)
Glucose-Capillary: 150 mg/dL — ABNORMAL HIGH (ref 65–99)

## 2016-02-14 LAB — CBC
HCT: 38.1 % (ref 36.0–46.0)
Hemoglobin: 13 g/dL (ref 12.0–15.0)
MCH: 31.5 pg (ref 26.0–34.0)
MCHC: 34.1 g/dL (ref 30.0–36.0)
MCV: 92.3 fL (ref 78.0–100.0)
Platelets: 171 10*3/uL (ref 150–400)
RBC: 4.13 MIL/uL (ref 3.87–5.11)
RDW: 13.1 % (ref 11.5–15.5)
WBC: 6.4 10*3/uL (ref 4.0–10.5)

## 2016-02-14 LAB — PROTIME-INR
INR: 1.92
Prothrombin Time: 22.2 seconds — ABNORMAL HIGH (ref 11.4–15.2)

## 2016-02-14 LAB — TROPONIN I: Troponin I: 0.59 ng/mL (ref <0.03)

## 2016-02-14 MED ORDER — WARFARIN SODIUM 5 MG PO TABS
5.0000 mg | ORAL_TABLET | Freq: Once | ORAL | Status: AC
  Administered 2016-02-14: 5 mg via ORAL
  Filled 2016-02-14: qty 1

## 2016-02-14 NOTE — Care Management Obs Status (Signed)
[facility] NOTIFICATION   Patient Details  Name: LENY BULLOCH MRN: [id number] Date of Birth: 1945-07-23   Medicare Observation Status Notification Given:  Yes    Zierra Laroque, Chauncey Reading, RN 02/14/2016, 11:00 AM

## 2016-02-14 NOTE — Progress Notes (Signed)
Subjective: Patient was admitted yesterday due to dizzness and lower extremity weakness. She was unable to move her legs but now she feels better. Her troponin is significantly elevated but no chest pain.  Objective: Vital signs in last 24 hours: Temp:  [97.7 F (36.5 C)-100 F (37.8 C)] 99.4 F (37.4 C) (01/22 0555) Pulse Rate:  [80-103] 80 (01/22 0555) Resp:  [13-28] 18 (01/22 0555) BP: (112-142)/(48-69) 120/58 (01/22 0555) SpO2:  [94 %-96 %] 94 % (01/22 0555) Weight:  [76.7 kg (169 lb)-77.1 kg (170 lb)] 76.7 kg (169 lb) (01/21 1457) Weight change:     Intake/Output from previous day: 01/21 0701 - 01/22 0700 In: 3442.9 [P.O.:120; I.V.:3322.9] Out: -   PHYSICAL EXAM General appearance: alert and no distress Resp: clear to auscultation bilaterally Cardio: S1, S2 normal GI: soft, non-tender; bowel sounds normal; no masses,  no organomegaly Extremities: extremities normal, atraumatic, no cyanosis or edema  Lab Results:  Results for orders placed or performed during the hospital encounter of 02/13/16 (from the past 48 hour(s))  CBC with Differential     Status: Abnormal   Collection Time: 02/13/16  9:46 AM  Result Value Ref Range   WBC 11.1 (H) 4.0 - 10.5 K/uL   RBC 4.58 3.87 - 5.11 MIL/uL   Hemoglobin 14.2 12.0 - 15.0 g/dL   HCT 41.8 36.0 - 46.0 %   MCV 91.3 78.0 - 100.0 fL   MCH 31.0 26.0 - 34.0 pg   MCHC 34.0 30.0 - 36.0 g/dL   RDW 12.9 11.5 - 15.5 %   Platelets 226 150 - 400 K/uL   Neutrophils Relative % 82 %   Neutro Abs 9.0 (H) 1.7 - 7.7 K/uL   Lymphocytes Relative 10 %   Lymphs Abs 1.1 0.7 - 4.0 K/uL   Monocytes Relative 8 %   Monocytes Absolute 0.9 0.1 - 1.0 K/uL   Eosinophils Relative 0 %   Eosinophils Absolute 0.0 0.0 - 0.7 K/uL   Basophils Relative 0 %   Basophils Absolute 0.0 0.0 - 0.1 K/uL  Comprehensive metabolic panel     Status: Abnormal   Collection Time: 02/13/16  9:46 AM  Result Value Ref Range   Sodium 133 (L) 135 - 145 mmol/L   Potassium 3.5  3.5 - 5.1 mmol/L   Chloride 98 (L) 101 - 111 mmol/L   CO2 23 22 - 32 mmol/L   Glucose, Bld 196 (H) 65 - 99 mg/dL   BUN 15 6 - 20 mg/dL   Creatinine, Ser 0.97 0.44 - 1.00 mg/dL   Calcium 9.1 8.9 - 10.3 mg/dL   Total Protein 7.6 6.5 - 8.1 g/dL   Albumin 3.9 3.5 - 5.0 g/dL   AST 28 15 - 41 U/L   ALT 16 14 - 54 U/L   Alkaline Phosphatase 71 38 - 126 U/L   Total Bilirubin 0.6 0.3 - 1.2 mg/dL   GFR calc non Af Amer 58 (L) >60 mL/min   GFR calc Af Amer >60 >60 mL/min    Comment: (NOTE) The eGFR has been calculated using the CKD EPI equation. This calculation has not been validated in all clinical situations. eGFR's persistently <60 mL/min signify possible Chronic Kidney Disease.    Anion gap 12 5 - 15  Protime-INR     Status: Abnormal   Collection Time: 02/13/16  9:46 AM  Result Value Ref Range   Prothrombin Time 20.7 (H) 11.4 - 15.2 seconds   INR 1.75   CBG monitoring, ED  Status: Abnormal   Collection Time: 02/13/16  9:49 AM  Result Value Ref Range   Glucose-Capillary 216 (H) 65 - 99 mg/dL  Urinalysis, Routine w reflex microscopic     Status: Abnormal   Collection Time: 02/13/16 10:11 AM  Result Value Ref Range   Color, Urine YELLOW YELLOW   APPearance CLEAR CLEAR   Specific Gravity, Urine 1.030 1.005 - 1.030   pH 6.0 5.0 - 8.0   Glucose, UA >=500 (A) NEGATIVE mg/dL   Hgb urine dipstick MODERATE (A) NEGATIVE   Bilirubin Urine NEGATIVE NEGATIVE   Ketones, ur NEGATIVE NEGATIVE mg/dL   Protein, ur NEGATIVE NEGATIVE mg/dL   Nitrite NEGATIVE NEGATIVE   Leukocytes, UA TRACE (A) NEGATIVE   RBC / HPF 6-30 0 - 5 RBC/hpf   WBC, UA 6-30 0 - 5 WBC/hpf   Bacteria, UA NONE SEEN NONE SEEN  I-Stat Troponin, ED (not at [facility])     Status: Abnormal   Collection Time: 02/13/16 10:34 AM  Result Value Ref Range   Troponin i, poc 0.41 (HH) 0.00 - 0.08 ng/mL   Comment 3            Comment: Due to the release kinetics of cTnI, a negative result within the first hours of the onset of  symptoms does not rule out myocardial infarction with certainty. If myocardial infarction is still suspected, repeat the test at appropriate intervals.   TSH     Status: None   Collection Time: 02/13/16  3:09 PM  Result Value Ref Range   TSH 0.538 0.350 - 4.500 uIU/mL    Comment: Performed by a 3rd Generation assay with a functional sensitivity of <=0.01 uIU/mL.  Troponin I     Status: Abnormal   Collection Time: 02/13/16  3:09 PM  Result Value Ref Range   Troponin I 0.66 (HH) <0.03 ng/mL    Comment: CRITICAL RESULT CALLED TO, READ BACK BY AND VERIFIED WITH: DISHMON,M. AT 1884 ON 02/13/2016 BY EVA   Glucose, capillary     Status: Abnormal   Collection Time: 02/13/16  4:18 PM  Result Value Ref Range   Glucose-Capillary 145 (H) 65 - 99 mg/dL  MRSA PCR Screening     Status: None   Collection Time: 02/13/16  4:30 PM  Result Value Ref Range   MRSA by PCR NEGATIVE NEGATIVE    Comment:        The GeneXpert MRSA Assay (FDA approved for NASAL specimens only), is one component of a comprehensive MRSA colonization surveillance program. It is not intended to diagnose MRSA infection nor to guide or monitor treatment for MRSA infections.   Glucose, capillary     Status: Abnormal   Collection Time: 02/13/16  8:44 PM  Result Value Ref Range   Glucose-Capillary 106 (H) 65 - 99 mg/dL   Comment 1 Notify RN    Comment 2 Document in Chart   Troponin I     Status: Abnormal   Collection Time: 02/13/16  8:59 PM  Result Value Ref Range   Troponin I 0.78 (HH) <0.03 ng/mL    Comment: CRITICAL VALUE NOTED.  VALUE IS CONSISTENT WITH PREVIOUSLY REPORTED AND CALLED VALUE.  Troponin I     Status: Abnormal   Collection Time: 02/14/16  3:00 AM  Result Value Ref Range   Troponin I 0.59 (HH) <0.03 ng/mL    Comment: CRITICAL VALUE NOTED.  VALUE IS CONSISTENT WITH PREVIOUSLY REPORTED AND CALLED VALUE.  Protime-INR     Status: Abnormal  Collection Time: 02/14/16  3:00 AM  Result Value Ref Range    Prothrombin Time 22.2 (H) 11.4 - 15.2 seconds   INR 1.92   CBC     Status: None   Collection Time: 02/14/16  3:00 AM  Result Value Ref Range   WBC 6.4 4.0 - 10.5 K/uL   RBC 4.13 3.87 - 5.11 MIL/uL   Hemoglobin 13.0 12.0 - 15.0 g/dL   HCT 38.1 36.0 - 46.0 %   MCV 92.3 78.0 - 100.0 fL   MCH 31.5 26.0 - 34.0 pg   MCHC 34.1 30.0 - 36.0 g/dL   RDW 13.1 11.5 - 15.5 %   Platelets 171 150 - 400 K/uL  Glucose, capillary     Status: None   Collection Time: 02/14/16  7:59 AM  Result Value Ref Range   Glucose-Capillary 77 65 - 99 mg/dL   Comment 1 Notify RN    Comment 2 Document in Chart     ABGS No results for input(s): PHART, PO2ART, TCO2, HCO3 in the last 72 hours.  Invalid input(s): PCO2 CULTURES Recent Results (from the past 240 hour(s))  MRSA PCR Screening     Status: None   Collection Time: 02/13/16  4:30 PM  Result Value Ref Range Status   MRSA by PCR NEGATIVE NEGATIVE Final    Comment:        The GeneXpert MRSA Assay (FDA approved for NASAL specimens only), is one component of a comprehensive MRSA colonization surveillance program. It is not intended to diagnose MRSA infection nor to guide or monitor treatment for MRSA infections.    Studies/Results: Dg Chest 2 View  Result Date: 02/13/2016 CLINICAL DATA:  Confusion and difficulty walking.  Coughing. EXAM: CHEST  2 VIEW COMPARISON:  May 25, 2011 FINDINGS: The study is limited due to patient positioning. However, taking this into account, the heart, hila, mediastinum, lungs, and pleura are unremarkable. A nodule projected over lower thoracic vertebral body on the lateral view is unchanged since 2013 of no significance. IMPRESSION: No active cardiopulmonary disease. Electronically Signed   By: Dorise Bullion III M.D   On: 02/13/2016 11:06   Ct Head Wo Contrast  Result Date: 02/13/2016 CLINICAL DATA:  Difficulty walking since last night. Left-sided hemiplegia. EXAM: CT HEAD WITHOUT CONTRAST TECHNIQUE: Contiguous axial  images were obtained from the base of the skull through the vertex without intravenous contrast. COMPARISON:  December 10, 2014 FINDINGS: Brain: No subdural, epidural, or subarachnoid hemorrhage. Ventricles and sulci are stable. Cerebellum, brainstem, and basal cisterns are normal. White matter changes and a few scattered lacunar infarcts are stable. No acute cortical ischemia or infarct. No mass, mass effect, or midline shift. Vascular: No hyperdense vessel or unexpected calcification. Skull: Normal. Negative for fracture or focal lesion. Sinuses/Orbits: Previous sinus surgery. Mastoid air cells and middle ears are unchanged unremarkable. Other: None. IMPRESSION: 1. No acute intracranial process to explain the patient's symptoms. 2. Chronic white matter changes. Electronically Signed   By: Dorise Bullion III M.D   On: 02/13/2016 11:27    Medications: I have reviewed the patient's current medications.  Assesment:  Active Problems:   Type 2 diabetes mellitus with vascular disease ([place])   Essential hypertension, benign   DVT, lower extremity, recurrent (HCC)   History of cardiovascular disorder   Chronic anticoagulation   Hyperlipidemia   Elevated troponin    Plan:  Medications reviewed Continue telemetry  Will do cardiology consult Continue to monitor troponin    LOS: 0 days  Izek Corvino 02/14/2016, 8:33 AM

## 2016-02-14 NOTE — Care Management Note (Signed)
Case Management Note  Patient Details  Name: [name] MRN: [id number] Date of Birth: 10-16-1945  Subjective/Objective:      Patient adm with dizziness, leg weakness, and elevated troponin. Patient is alert and oriented at time of assessment. She reporst living at [facility] and plans to return at discharge. Cardiology consult pending, patient hopes to discharge today.            Action/Plan: Anticipate DC back to ALF. CSW aware.    Expected Discharge Date:    02/14/2016              Expected Discharge Plan:  Assisted Living / Rest Home  In-House Referral:  Clinical Social Work  Discharge planning Services  CM Consult  Post Acute Care Choice:  NA Choice offered to:  NA  DME Arranged:    DME Agency:     HH Arranged:    [place] Agency:     Status of Service:  Completed, signed off  If discussed at [employer], dates discussed:    Additional Comments:  Elif Yonts, Chauncey [place], RN 02/14/2016, 10:56 AM

## 2016-02-14 NOTE — Clinical Social Work Note (Signed)
Clinical Social Work Assessment  Patient Details  Name: [name] MRN: [id number] Date of Birth: [birth year]  Date of referral:  02/14/16               Reason for consult:  Discharge Planning                Permission sought to share information with:  [place] granted to share information::  Yes, Verbal Permission Granted  Name::        Agency::  [facility]  Relationship::  facility  Contact Information:     Housing/Transportation Living arrangements for the past 2 months:  [place] of Information:  Guardian, Facility Patient Interpreter Needed:  None Criminal Activity/Legal Involvement Pertinent to Current Situation/Hospitalization:  No - Comment as needed Significant Relationships:  Adult Children Lives with:  Facility Resident Do you feel safe going back to the place where you live?  Yes Need for family participation in patient care:  No (Coment)  Care giving concerns:  None reported. Pt is long term resident at ALF.    Social Worker assessment / plan:  CSW spoke with pt's legal guardian, [name] [phone], ext [phone]) at [place]. Pt has been a resident at [facility] for about 7 years. Her daughter, [name] is involved and can have information on pt per [place]. CSW also spoke with Baker Janus at facility who reports pt requires limited assist with ADLs and uses a wheelchair. No home health prior to admission and okay to return.   Employment status:  Retired Forensic scientist:  Medicare PT Recommendations:  Not assessed at this time [place] / Referral to community resources:  Other (Comment Required) (Return to [facility])  Patient/Family's Response to care:  Guardian requests return to [facility] when medically stable.   Patient/Family's Understanding of and Emotional Response to Diagnosis, Current Treatment, and Prognosis:  Updated guardian on admission diagnosis and status.    Emotional Assessment Appearance:  Appears stated age Attitude/Demeanor/Rapport:  Unable to Assess Affect (typically observed):  Unable to Assess Orientation:    Alcohol / Substance use:  Not Applicable Psych involvement (Current and /or in the community):  No (Comment)  Discharge Needs  Concerns to be addressed:  Discharge Planning Concerns Readmission within the last 30 days:  No Current discharge risk:  None Barriers to Discharge:  Continued Medical Work up   Salome Arnt, [place] 02/14/2016, 9:54 AM [phone]

## 2016-02-14 NOTE — Progress Notes (Signed)
ANTICOAGULATION CONSULT NOTE -   Pharmacy Consult for Warfarin Indication: VTE Treatment  Allergies  Allergen Reactions  . Sulfa Antibiotics Rash  . Sulfonamide Derivatives Rash    REACTION: rash    Patient Measurements: Height: 5\' 4"  (162.6 cm) Weight: 169 lb (76.7 kg) IBW/kg (Calculated) : 54.7  Vital Signs: Temp: 99.4 F (37.4 C) (01/22 0555) Temp Source: Oral (01/22 0555) BP: 120/58 (01/22 0555) Pulse Rate: 80 (01/22 0555)  Labs:  Recent Labs  02/13/16 0946 02/13/16 1509 02/13/16 2059 02/14/16 0300  HGB 14.2  --   --  13.0  HCT 41.8  --   --  38.1  PLT 226  --   --  171  LABPROT 20.7*  --   --  22.2*  INR 1.75  --   --  1.92  CREATININE 0.97  --   --   --   TROPONINI  --  0.66* 0.78* 0.59*    Estimated Creatinine Clearance: 54.1 mL/min (by C-G formula based on SCr of 0.97 mg/dL).   Medical History: Past Medical History:  Diagnosis Date  . Anxiety   . Arthritis   . Depression    History of psychosis and previous suicide attempt  . DVT, lower extremity, recurrent (HCC)    Long-term Coumadin per Dr. Legrand Rams  . Essential hypertension   . GERD (gastroesophageal reflux disease)   . Hemiplegia ([place])   . Hemiplegia ([place]) 2010   left side  . History of stroke    Acute infarct and right cerebral white matter small vessel disease 12/10  . Leg DVT (deep venous thromboembolism), acute ([place]) 2006  . Schizophrenia ([place])   . Stroke ([place])   . Type 2 diabetes mellitus (HCC)     Medications:  Prescriptions Prior to Admission  Medication Sig Dispense Refill Last Dose  . acetaminophen (TYLENOL) 325 MG tablet Take 650 mg by mouth every 6 (six) hours as needed. For fever > 101    unknown  . ARIPiprazole (ABILIFY) 2 MG tablet    02/12/2016 at Unknown time  . calcium carbonate (TUMS - DOSED IN MG ELEMENTAL CALCIUM) 500 MG chewable tablet Chew 1 tablet by mouth 4 (four) times daily as needed for indigestion or heartburn.    unknown  . canagliflozin (INVOKANA) 100 MG  TABS tablet Take 100 mg by mouth daily.   02/13/2016 at Unknown time  . clonazePAM (KLONOPIN) 0.5 MG tablet Take 0.5 mg by mouth 3 (three) times daily.    02/13/2016 at Unknown time  . DULoxetine (CYMBALTA) 60 MG capsule Take 120 mg by mouth daily.    02/13/2016 at Unknown time  . enalapril (VASOTEC) 2.5 MG tablet    02/13/2016 at Unknown time  . ferrous sulfate (EQL SLOW RELEASE IRON) 160 (50 Fe) MG TBCR SR tablet Take 1 tablet (160 mg total) by mouth daily. 30 each 11 02/12/2016 at Unknown time  . HYDROcodone-acetaminophen (NORCO) 5-325 MG per tablet Take 1 tablet by mouth 3 (three) times daily.    02/13/2016 at Unknown time  . latanoprost (XALATAN) 0.005 % ophthalmic solution Place 1 drop into both eyes at bedtime.   02/12/2016 at Unknown time  . LEVEMIR FLEXTOUCH 100 UNIT/ML Pen INJECT 100 UNITS SUBCUTANEOUSLY AT BEDTIME. 30 mL 2 02/12/2016 at Unknown time  . LINZESS 290 MCG CAPS capsule TAKE 1 CAPSULE BY MOUTH EACH MORNING 30 MINUTES BEFORE BREAKFAST. 30 capsule 11 02/13/2016 at Unknown time  . loperamide (IMODIUM) 2 MG capsule Take 4 mg by mouth daily as  needed for diarrhea or loose stools. Take 2 capsules initially then take 2 after each loose stool per MAR   unknown  . metoprolol succinate (TOPROL-XL) 25 MG 24 hr tablet Take 12.5 mg by mouth 2 (two) times daily.    02/13/2016 at 0800  . mirtazapine (REMERON) 30 MG tablet Take 30 mg by mouth at bedtime.     02/12/2016 at Unknown time  . NOVOLOG FLEXPEN 100 UNIT/ML FlexPen SS: 251-300 ADD 3 UNITS; 301-350 ADD 4 UNITS; 351-400 ADD 5 UNITS; 401-450 ADD 6 UNITS AND CALL DOCTOR. 15 mL 2 02/13/2016 at Unknown time  . NOVOLOG FLEXPEN 100 UNIT/ML FlexPen INJECT 35 UNITS SUBCUTANEOUSLY BEFORE MEALS IF BS IS BETWEEN 90-150. 150-200 ADD 1 UNIT , 201-250 ADD 2 UNITS 15 mL 2 02/13/2016 at Unknown time  . omeprazole (PRILOSEC) 40 MG capsule Take 40 mg by mouth daily.   02/13/2016 at Unknown time  . polyvinyl alcohol (ARTIFICIAL TEARS) 1.4 % ophthalmic solution Place 1  drop into both eyes 3 (three) times daily.   02/13/2016 at Unknown time  . risperiDONE (RISPERDAL) 2 MG tablet Take 2 mg by mouth 2 (two) times daily.   02/13/2016 at Unknown time  . simvastatin (ZOCOR) 20 MG tablet Take 20 mg by mouth at bedtime.   02/12/2016 at Unknown time  . solifenacin (VESICARE) 5 MG tablet Take 5 mg by mouth daily.   02/13/2016 at Unknown time  . tiZANidine (ZANAFLEX) 2 MG tablet Take 2 mg by mouth daily.   02/12/2016 at Unknown time  . Vitamin D, Ergocalciferol, (DRISDOL) 50000 UNITS CAPS capsule Take 50,000 Units by mouth every 7 (seven) days. Takes on Fridays.   Past Week at Unknown time  . warfarin (COUMADIN) 5 MG tablet TAKE 1 TABLET BY MOUTH ONCE DAILY. 30 tablet 6 02/12/2016 at 0800  . zolpidem (AMBIEN) 10 MG tablet Take 10 mg by mouth at bedtime.   02/12/2016 at Unknown time  . docusate sodium (COLACE) 100 MG capsule Take 1 capsule (100 mg total) by mouth daily as needed for mild constipation. IF LINZESS DOES NOT FACILITATE A BM ON ANY GIVEN DAY. (Patient not taking: Reported on 02/13/2016) 30 capsule 3 Not Taking at Unknown time    Assessment: Continuation of warfarin PTA. Patient with history of DVT on chronic anticoagulation with Coumadin. Patients regimen per anticoagulation clinic 01/31/2016 is 5mg  daily except 2.5mg  on Monday, Wednesday, and Friday. INR today is subtherapeutic at 1.92.    Goal of Therapy:  INR 2-3 Monitor platelets by anticoagulation protocol: Yes   Plan:  Warfarin 5 mg po today PT/INR daily Monitor for S/S of bleeding  Isac Sarna, BS [place], BCPS Clinical Pharmacist Pager [phone] 02/14/2016,8:48 AM

## 2016-02-14 NOTE — Consult Note (Signed)
Cardiology Consultation   Patient ID: [name]; [id number]; [birth year]   Admit date: 02/13/2016 Date of Consult: 02/14/2016  Referring MD: Dr. Legrand Rams Cardiologist: Dr. Domenic Polite Consulting Cardiologist: Dr. Domenic Polite  Patient Care Team: [name], MD as PCP - General (Internal Medicine) Danie Binder, MD as Consulting Physician (Gastroenterology)    Reason for Consultation: Elevated troponins   History of Present Illness: [name] is a 71 y.o. female followed in our Coumadin clinic for recurrent DVT and prior CVA with left-sided residual. She resides at [place] home. She also has hypertension, abnormal EKG which is a chronic finding with no history of MI or chest pain. 2-D echo in 2010 showed normal LVEF 60-65%. She also has a history of iron deficiency anemia due to erosive gastropathy and enteropathy.  Patient now is admitted with transient left lower extremity paralysis. Troponins were cycled and elevated so we were called to consult. She has had no chest pain or palpitations. Troponins were 0.41, 0.66, 0.78, 0.59. She said she was able to walk a little bit but now her she can't because of her left leg paralysis. She says she can move it but she can't walk on it. She clearly denies any chest pain, palpitations, dyspnea, dyspnea on exertion, dizziness or presyncope. She actually thought she came in with symptoms of the flu with a low grade fever and cough.  Past Medical History:  Diagnosis Date  . Anxiety   . Arthritis   . Depression    History of psychosis and previous suicide attempt  . DVT, lower extremity, recurrent (HCC)    Long-term Coumadin per Dr. Legrand Rams  . Essential hypertension   . GERD (gastroesophageal reflux disease)   . Hemiplegia ([place]) 2010   Left side  . History of stroke    Acute infarct and right cerebral white matter small vessel disease 12/10  . Leg DVT (deep venous thromboembolism), acute ([place]) 2006  . Schizophrenia ([place])   .  Stroke ([place])   . Type 2 diabetes mellitus ([place])     Past Surgical History:  Procedure Laterality Date  . BACK SURGERY    . BIOPSY N/A 11/24/2014   Procedure: BIOPSY;  Surgeon: Danie Binder, MD;  Location: [facility];  Service: Endoscopy;  Laterality: N/A;  . COLONOSCOPY WITH PROPOFOL N/A 11/24/2014   Dr. Rudie Meyer polyps removed/moderate sized internal hemorrhoids, tubular adenomas. Next surveillance in 3 years  . ESOPHAGOGASTRODUODENOSCOPY (EGD) WITH PROPOFOL N/A 11/24/2014   Dr. Clayburn Pert HH/patent stricture at the gastroesophageal junction, mild non-erosive gastritis, path negative for H.pylori or celiac sprue  . GIVENS CAPSULE STUDY N/A 12/11/2014   MULTILPLE EROSION IN the stomach WITH ACTIVE OOZING. OCCASIONAL EROSIONS AND RARE ULCER SEEN IN PROXIMAL SMALL BOWEL . No masses or AVMs SEEN. NO OLD BLOOD OR FRESH BLOOD SEEN.   . POLYPECTOMY N/A 11/24/2014   Procedure: POLYPECTOMY;  Surgeon: Danie Binder, MD;  Location: [facility];  Service: Endoscopy;  Laterality: N/A;     Home Meds: Prior to Admission medications   Medication Sig Start Date End Date Taking? Authorizing Provider  acetaminophen (TYLENOL) 325 MG tablet Take 650 mg by mouth every 6 (six) hours as needed. For fever > 101    Yes Historical Provider, MD  ARIPiprazole (ABILIFY) 2 MG tablet  08/31/15  Yes Historical Provider, MD  calcium carbonate (TUMS - DOSED IN MG ELEMENTAL CALCIUM) 500 MG chewable tablet Chew 1 tablet by mouth 4 (four) times daily as needed for indigestion or heartburn.  Yes Historical Provider, MD  canagliflozin (INVOKANA) 100 MG TABS tablet Take 100 mg by mouth daily.   Yes Historical Provider, MD  clonazePAM (KLONOPIN) 0.5 MG tablet Take 0.5 mg by mouth 3 (three) times daily.  10/08/14  Yes Historical Provider, MD  DULoxetine (CYMBALTA) 60 MG capsule Take 120 mg by mouth daily.    Yes Historical Provider, MD  enalapril (VASOTEC) 2.5 MG tablet  08/31/15  Yes Historical Provider, MD  ferrous sulfate (EQL SLOW  RELEASE IRON) 160 (50 Fe) MG TBCR SR tablet Take 1 tablet (160 mg total) by mouth daily. 01/07/16  Yes Carlis Stable, NP  HYDROcodone-acetaminophen (NORCO) 5-325 MG per tablet Take 1 tablet by mouth 3 (three) times daily.    Yes Historical Provider, MD  latanoprost (XALATAN) 0.005 % ophthalmic solution Place 1 drop into both eyes at bedtime.   Yes Historical Provider, MD  LEVEMIR FLEXTOUCH 100 UNIT/ML Pen INJECT 100 UNITS SUBCUTANEOUSLY AT BEDTIME. 12/23/15  Yes Cassandria Anger, MD  LINZESS 290 MCG CAPS capsule TAKE 1 CAPSULE BY MOUTH EACH MORNING 30 MINUTES BEFORE BREAKFAST. 03/09/15  Yes Mahala Menghini, PA-C  loperamide (IMODIUM) 2 MG capsule Take 4 mg by mouth daily as needed for diarrhea or loose stools. Take 2 capsules initially then take 2 after each loose stool per [facility]   Yes Historical Provider, MD  metoprolol succinate (TOPROL-XL) 25 MG 24 hr tablet Take 12.5 mg by mouth 2 (two) times daily.    Yes Historical Provider, MD  mirtazapine (REMERON) 30 MG tablet Take 30 mg by mouth at bedtime.     Yes Historical Provider, MD  NOVOLOG FLEXPEN 100 UNIT/ML FlexPen SS: 251-300 ADD 3 UNITS; 301-350 ADD 4 UNITS; 351-400 ADD 5 UNITS; 401-450 ADD 6 UNITS AND CALL DOCTOR. 12/06/15  Yes Cassandria Anger, MD  NOVOLOG FLEXPEN 100 UNIT/ML FlexPen INJECT 35 UNITS SUBCUTANEOUSLY BEFORE MEALS IF BS IS BETWEEN 90-150. 150-200 ADD 1 UNIT , 201-250 ADD 2 UNITS 02/03/16  Yes Cassandria Anger, MD  omeprazole (PRILOSEC) 40 MG capsule Take 40 mg by mouth daily.   Yes Historical Provider, MD  polyvinyl alcohol (ARTIFICIAL TEARS) 1.4 % ophthalmic solution Place 1 drop into both eyes 3 (three) times daily.   Yes Historical Provider, MD  risperiDONE (RISPERDAL) 2 MG tablet Take 2 mg by mouth 2 (two) times daily.   Yes Historical Provider, MD  simvastatin (ZOCOR) 20 MG tablet Take 20 mg by mouth at bedtime.   Yes Historical Provider, MD  solifenacin (VESICARE) 5 MG tablet Take 5 mg by mouth daily.   Yes Historical  Provider, MD  tiZANidine (ZANAFLEX) 2 MG tablet Take 2 mg by mouth daily.   Yes Historical Provider, MD  Vitamin D, Ergocalciferol, (DRISDOL) 50000 UNITS CAPS capsule Take 50,000 Units by mouth every 7 (seven) days. Takes on Fridays.   Yes Historical Provider, MD  warfarin (COUMADIN) 5 MG tablet TAKE 1 TABLET BY MOUTH ONCE DAILY. 09/28/15  Yes Arnoldo Lenis, MD  zolpidem (AMBIEN) 10 MG tablet Take 10 mg by mouth at bedtime.   Yes Historical Provider, MD  docusate sodium (COLACE) 100 MG capsule Take 1 capsule (100 mg total) by mouth daily as needed for mild constipation. IF LINZESS DOES NOT FACILITATE A BM ON ANY GIVEN DAY. Patient not taking: Reported on 02/13/2016 10/14/15   Annitta Needs, NP    Current Medications: . ARIPiprazole  2 mg Oral Daily  . canagliflozin  100 mg Oral Daily  . clonazePAM  0.5 mg Oral TID  . darifenacin  7.5 mg Oral Daily  . DULoxetine  120 mg Oral Daily  . enalapril  2.5 mg Oral Daily  . ferrous sulfate  325 mg Oral Q breakfast  . insulin aspart  0-20 Units Subcutaneous TID WC  . insulin aspart  0-5 Units Subcutaneous QHS  . insulin aspart  10 Units Subcutaneous TID WC  . insulin detemir  75 Units Subcutaneous QHS  . latanoprost  1 drop Both Eyes QHS  . linaclotide  290 mcg Oral QAC breakfast  . metoprolol succinate  12.5 mg Oral BID  . mirtazapine  30 mg Oral QHS  . pantoprazole  80 mg Oral Daily  . polyvinyl alcohol  1 drop Both Eyes TID  . risperiDONE  2 mg Oral BID  . simvastatin  20 mg Oral QHS  . sodium chloride flush  3 mL Intravenous Q12H  . tiZANidine  2 mg Oral Daily  . [START ON 02/18/2016] Vitamin D (Ergocalciferol)  50,000 Units Oral Q7 days  . warfarin  5 mg Oral Once  . Warfarin - Pharmacist Dosing Inpatient   Does not apply Q24H  . zolpidem  5 mg Oral QHS     Allergies:    Allergies  Allergen Reactions  . Sulfa Antibiotics Rash  . Sulfonamide Derivatives Rash    REACTION: rash    Social History:   The patient  reports that she quit  smoking about 21 years ago. She has never used smokeless tobacco. She reports that she does not drink alcohol or use drugs.    Family History:   The patient's family history includes Hypertension in her mother.   ROS:  Please see the history of present illness.  Review of Systems  Constitution: Positive for fever and weakness.  HENT: Positive for hearing loss.   Eyes: Negative.   Cardiovascular: Negative.   Respiratory: Positive for cough.   Hematologic/Lymphatic: Negative.   Musculoskeletal: Positive for muscle weakness. Negative for joint pain.  Gastrointestinal: Negative.   Genitourinary: Negative.   Neurological: Positive for focal weakness.   All other ROS reviewed and negative.      Vital Signs: Blood pressure (!) 118/54, pulse 73, temperature 98.6 F (37 C), temperature source Oral, resp. rate 18, height 5\' 4"  (1.626 m), weight 169 lb (76.7 kg), SpO2 96 %.   PHYSICAL EXAM: General:  Well nourished, well developed, in no acute distress  HEENT: normal Lymph: no adenopathy Neck: no JVD Endocrine:  No thryomegaly Vascular: No carotid bruits; FA pulses 2+ bilaterally without bruits  Cardiac:  RRR; normal S1, S2; 1 to 2/6 systolic murmur at the left sternal border, no rub, bruit, thrill, or heave Lungs:  clear to auscultation bilaterally, no wheezing, rhonchi or rales  Abd: soft, nontender, no hepatomegaly  Ext: Trace edema, Good distal pulses bilaterally Musculoskeletal:  No deformities, left lower extremity weakness Skin: warm and dry  Neuro:  Alert and oriented, weak on her left side Psych:  Normal affect    EKG:  Normal sinus rhythm with nonspecific ST-T wave changes, no acute change from prior tracings  Telemetry: Normal sinus rhythm  Labs:  Recent Labs  02/13/16 1509 02/13/16 2059 02/14/16 0300  TROPONINI 0.66* 0.78* 0.59*   Lab Results  Component Value Date   WBC 6.4 02/14/2016   HGB 13.0 02/14/2016   HCT 38.1 02/14/2016   MCV 92.3 02/14/2016   PLT  171 02/14/2016    Recent Labs Lab 02/13/16 0946  NA 133*  K 3.5  CL 98*  CO2 23  BUN 15  CREATININE 0.97  CALCIUM 9.1  PROT 7.6  BILITOT 0.6  ALKPHOS 71  ALT 16  AST 28  GLUCOSE 196*   Lab Results  Component Value Date   CHOL (H) 01/03/2009    210        ATP III CLASSIFICATION:  <200     mg/dL   Desirable  200-239  mg/dL   Borderline High  >=240    mg/dL   High          HDL 35 (L) 01/03/2009   LDLCALC (H) 01/03/2009    143        Total Cholesterol/HDL:CHD Risk Coronary Heart Disease Risk Table                     Men   Women  1/2 Average Risk   3.4   3.3  Average Risk       5.0   4.4  2 X Average Risk   9.6   7.1  3 X Average Risk  23.4   11.0        Use the calculated Patient Ratio above and the CHD Risk Table to determine the patient's CHD Risk.        ATP III CLASSIFICATION (LDL):  <100     mg/dL   Optimal  100-129  mg/dL   Near or Above                    Optimal  130-159  mg/dL   Borderline  160-189  mg/dL   High  >190     mg/dL   Very High   TRIG 161 (H) 01/03/2009   No results found for: DDIMER  Radiology/Studies:  Dg Chest 2 View  Result Date: 02/13/2016 CLINICAL DATA:  Confusion and difficulty walking.  Coughing. EXAM: CHEST  2 VIEW COMPARISON:  May 25, 2011 FINDINGS: The study is limited due to patient positioning. However, taking this into account, the heart, hila, mediastinum, lungs, and pleura are unremarkable. A nodule projected over lower thoracic vertebral body on the lateral view is unchanged since 2013 of no significance. IMPRESSION: No active cardiopulmonary disease. Electronically Signed   By: Dorise Bullion III M.D   On: 02/13/2016 11:06   Ct Head Wo Contrast  Result Date: 02/13/2016 CLINICAL DATA:  Difficulty walking since last night. Left-sided hemiplegia. EXAM: CT HEAD WITHOUT CONTRAST TECHNIQUE: Contiguous axial images were obtained from the base of the skull through the vertex without intravenous contrast. COMPARISON:  December 10, 2014 FINDINGS: Brain: No subdural, epidural, or subarachnoid hemorrhage. Ventricles and sulci are stable. Cerebellum, brainstem, and basal cisterns are normal. White matter changes and a few scattered lacunar infarcts are stable. No acute cortical ischemia or infarct. No mass, mass effect, or midline shift. Vascular: No hyperdense vessel or unexpected calcification. Skull: Normal. Negative for fracture or focal lesion. Sinuses/Orbits: Previous sinus surgery. Mastoid air cells and middle ears are unchanged unremarkable. Other: None. IMPRESSION: 1. No acute intracranial process to explain the patient's symptoms. 2. Chronic white matter changes. Electronically Signed   By: Dorise Bullion III M.D   On: 02/13/2016 11:27    ASSESSMENT AND PLAN:  Transient left lower extremity paralysis has had off and on since her prior CVA but seems to be worse currently. No acute change on CT  Elevated troponins: No acute EKG changes or chest pain. Will order 2-D echo to assess  LV function. Doubt ACS. In light of no symptoms and current comorbidities will not proceed with ischemic workup.  History of recurrent DVT followed in our Coumadin clinic  Prior CVA with left hemiparesis on Coumadin  Essential hypertension controlled  Diabetes mellitus   Signed, Ermalinda Barrios, PA-C 02/14/2016 2:55 PM    Attending note:  Patient seen and examined. Reviewed records and discussed the case with Ms. Bonnell Public PA-C. She is admitted from [facility] with worsened left leg weakness, has a prior history of stroke and recurrent DVTs on Coumadin, managed by Dr. Legrand Rams through our anticoagulation clinic. As part of her workup, troponin levels were obtained and found to be abnormal as outlined above, overall mild elevation and relatively flat pattern. She has had no chest pain or palpitations whatsoever. ECG is chronically abnormal, no clear acute change at this time.  On examination she appears comfortable. Heart rate  in the 70s in sinus rhythm by telemetry, systolic blood pressure [id number]. Lungs are clear without labored breathing. Cardiac exam reveals RRR with soft systolic murmur and no gallop. Lab work shows hemoglobin 13.0, peak troponin I 0.78, INR 1.9. I personally reviewed her tracing from 02/13/2016 which showed sinus tachycardia with nonspecific ST changes.  Mild elevation in troponin I, not clearly diagnostic of ACS. She has had no chest pain. Telemetry did show a brief burst of SVT which could potentially be related to an elevation in troponin levels, but she does not report any significant palpitations over time. We will obtain an echocardiogram to ensure stability in LVEF. Do not anticipate further ischemic workup at this time however.  Satira Sark, M.D., F.A.C.C.

## 2016-02-15 ENCOUNTER — Inpatient Hospital Stay (HOSPITAL_COMMUNITY): Payer: Medicare Other

## 2016-02-15 DIAGNOSIS — R55 Syncope and collapse: Secondary | ICD-10-CM

## 2016-02-15 LAB — CBC
HCT: 39.6 % (ref 36.0–46.0)
Hemoglobin: 13.5 g/dL (ref 12.0–15.0)
MCH: 31.5 pg (ref 26.0–34.0)
MCHC: 34.1 g/dL (ref 30.0–36.0)
MCV: 92.3 fL (ref 78.0–100.0)
Platelets: 168 10*3/uL (ref 150–400)
RBC: 4.29 MIL/uL (ref 3.87–5.11)
RDW: 13.2 % (ref 11.5–15.5)
WBC: 3.7 10*3/uL — ABNORMAL LOW (ref 4.0–10.5)

## 2016-02-15 LAB — GLUCOSE, CAPILLARY
Glucose-Capillary: 59 mg/dL — ABNORMAL LOW (ref 65–99)
Glucose-Capillary: 89 mg/dL (ref 65–99)
Glucose-Capillary: 133 mg/dL — ABNORMAL HIGH (ref 65–99)
Glucose-Capillary: 186 mg/dL — ABNORMAL HIGH (ref 65–99)
Glucose-Capillary: 77 mg/dL (ref 65–99)

## 2016-02-15 LAB — ECHOCARDIOGRAM COMPLETE
Height: 64 in
Weight: 2704 oz

## 2016-02-15 LAB — PROTIME-INR
INR: 1.99
Prothrombin Time: 22.9 seconds — ABNORMAL HIGH (ref 11.4–15.2)

## 2016-02-15 LAB — TROPONIN I: Troponin I: 0.23 ng/mL (ref <0.03)

## 2016-02-15 MED ORDER — WARFARIN SODIUM 5 MG PO TABS
5.0000 mg | ORAL_TABLET | Freq: Once | ORAL | Status: AC
  Administered 2016-02-15: 5 mg via ORAL
  Filled 2016-02-15: qty 1

## 2016-02-15 MED ORDER — GLUCOSE 40 % PO GEL
ORAL | Status: AC
Start: 2016-02-15 — End: 2016-02-15
  Administered 2016-02-15: 37.5 g
  Filled 2016-02-15: qty 1

## 2016-02-15 NOTE — Progress Notes (Signed)
*  PRELIMINARY RESULTS* Echocardiogram 2D Echocardiogram has been performed.  Leavy Cella 02/15/2016, 10:38 AM

## 2016-02-15 NOTE — Progress Notes (Signed)
ANTICOAGULATION CONSULT NOTE -   Pharmacy Consult for Warfarin Indication: VTE Treatment  Allergies  Allergen Reactions  . Sulfa Antibiotics Rash  . Sulfonamide Derivatives Rash    REACTION: rash   Patient Measurements: Height: 5\' 4"  (162.6 cm) Weight: 169 lb (76.7 kg) IBW/kg (Calculated) : 54.7  Vital Signs: Temp: 97.6 F (36.4 C) (01/23 0443) Temp Source: Oral (01/23 0443) BP: 158/69 (01/23 0443) Pulse Rate: 87 (01/23 0443)  Labs:  Recent Labs  02/13/16 0946  02/13/16 2059 02/14/16 0300 02/15/16 0540  HGB 14.2  --   --  13.0 13.5  HCT 41.8  --   --  38.1 39.6  PLT 226  --   --  171 168  LABPROT 20.7*  --   --  22.2* 22.9*  INR 1.75  --   --  1.92 1.99  CREATININE 0.97  --   --   --   --   TROPONINI  --   < > 0.78* 0.59* 0.23*  < > = values in this interval not displayed.  Estimated Creatinine Clearance: 54.1 mL/min (by C-G formula based on SCr of 0.97 mg/dL).  Medical History: Past Medical History:  Diagnosis Date  . Anxiety   . Arthritis   . Depression    History of psychosis and previous suicide attempt  . DVT, lower extremity, recurrent (HCC)    Long-term Coumadin per Dr. Legrand Rams  . Essential hypertension   . GERD (gastroesophageal reflux disease)   . Hemiplegia ([place]) 2010   Left side  . History of stroke    Acute infarct and right cerebral white matter small vessel disease 12/10  . Leg DVT (deep venous thromboembolism), acute ([place]) 2006  . Schizophrenia ([place])   . Stroke ([place])   . Type 2 diabetes mellitus (HCC)    Medications:  Prescriptions Prior to Admission  Medication Sig Dispense Refill Last Dose  . acetaminophen (TYLENOL) 325 MG tablet Take 650 mg by mouth every 6 (six) hours as needed. For fever > 101    unknown  . ARIPiprazole (ABILIFY) 2 MG tablet    02/12/2016 at Unknown time  . calcium carbonate (TUMS - DOSED IN MG ELEMENTAL CALCIUM) 500 MG chewable tablet Chew 1 tablet by mouth 4 (four) times daily as needed for indigestion or  heartburn.    unknown  . canagliflozin (INVOKANA) 100 MG TABS tablet Take 100 mg by mouth daily.   02/13/2016 at Unknown time  . clonazePAM (KLONOPIN) 0.5 MG tablet Take 0.5 mg by mouth 3 (three) times daily.    02/13/2016 at Unknown time  . DULoxetine (CYMBALTA) 60 MG capsule Take 120 mg by mouth daily.    02/13/2016 at Unknown time  . enalapril (VASOTEC) 2.5 MG tablet    02/13/2016 at Unknown time  . ferrous sulfate (EQL SLOW RELEASE IRON) 160 (50 Fe) MG TBCR SR tablet Take 1 tablet (160 mg total) by mouth daily. 30 each 11 02/12/2016 at Unknown time  . HYDROcodone-acetaminophen (NORCO) 5-325 MG per tablet Take 1 tablet by mouth 3 (three) times daily.    02/13/2016 at Unknown time  . latanoprost (XALATAN) 0.005 % ophthalmic solution Place 1 drop into both eyes at bedtime.   02/12/2016 at Unknown time  . LEVEMIR FLEXTOUCH 100 UNIT/ML Pen INJECT 100 UNITS SUBCUTANEOUSLY AT BEDTIME. 30 mL 2 02/12/2016 at Unknown time  . LINZESS 290 MCG CAPS capsule TAKE 1 CAPSULE BY MOUTH EACH MORNING 30 MINUTES BEFORE BREAKFAST. 30 capsule 11 02/13/2016 at Unknown time  .  loperamide (IMODIUM) 2 MG capsule Take 4 mg by mouth daily as needed for diarrhea or loose stools. Take 2 capsules initially then take 2 after each loose stool per MAR   unknown  . metoprolol succinate (TOPROL-XL) 25 MG 24 hr tablet Take 12.5 mg by mouth 2 (two) times daily.    02/13/2016 at 0800  . mirtazapine (REMERON) 30 MG tablet Take 30 mg by mouth at bedtime.     02/12/2016 at Unknown time  . NOVOLOG FLEXPEN 100 UNIT/ML FlexPen SS: 251-300 ADD 3 UNITS; 301-350 ADD 4 UNITS; 351-400 ADD 5 UNITS; 401-450 ADD 6 UNITS AND CALL DOCTOR. 15 mL 2 02/13/2016 at Unknown time  . NOVOLOG FLEXPEN 100 UNIT/ML FlexPen INJECT 35 UNITS SUBCUTANEOUSLY BEFORE MEALS IF BS IS BETWEEN 90-150. 150-200 ADD 1 UNIT , 201-250 ADD 2 UNITS 15 mL 2 02/13/2016 at Unknown time  . omeprazole (PRILOSEC) 40 MG capsule Take 40 mg by mouth daily.   02/13/2016 at Unknown time  . polyvinyl  alcohol (ARTIFICIAL TEARS) 1.4 % ophthalmic solution Place 1 drop into both eyes 3 (three) times daily.   02/13/2016 at Unknown time  . risperiDONE (RISPERDAL) 2 MG tablet Take 2 mg by mouth 2 (two) times daily.   02/13/2016 at Unknown time  . simvastatin (ZOCOR) 20 MG tablet Take 20 mg by mouth at bedtime.   02/12/2016 at Unknown time  . solifenacin (VESICARE) 5 MG tablet Take 5 mg by mouth daily.   02/13/2016 at Unknown time  . tiZANidine (ZANAFLEX) 2 MG tablet Take 2 mg by mouth daily.   02/12/2016 at Unknown time  . Vitamin D, Ergocalciferol, (DRISDOL) 50000 UNITS CAPS capsule Take 50,000 Units by mouth every 7 (seven) days. Takes on Fridays.   Past Week at Unknown time  . warfarin (COUMADIN) 5 MG tablet TAKE 1 TABLET BY MOUTH ONCE DAILY. 30 tablet 6 02/12/2016 at 0800  . zolpidem (AMBIEN) 10 MG tablet Take 10 mg by mouth at bedtime.   02/12/2016 at Unknown time  . docusate sodium (COLACE) 100 MG capsule Take 1 capsule (100 mg total) by mouth daily as needed for mild constipation. IF LINZESS DOES NOT FACILITATE A BM ON ANY GIVEN DAY. (Patient not taking: Reported on 02/13/2016) 30 capsule 3 Not Taking at Unknown time   Assessment: Continuation of warfarin PTA. Patient with history of DVT on chronic anticoagulation with Coumadin. Patients regimen per anticoagulation clinic 01/31/2016 is 5mg  daily except 2.5mg  on Monday, Wednesday, and Friday. INR today is at low end of range but is trending up.  Goal of Therapy:  INR 2-3 Monitor platelets by anticoagulation protocol: Yes   Plan:  Warfarin 5 mg po today PT/INR daily Monitor for S/S of bleeding  Hart Robinsons, PharmD Clinical Pharmacist Pager:  [phone] 02/15/2016   02/15/2016,10:35 AM

## 2016-02-15 NOTE — Progress Notes (Signed)
Subjective: Patient continue to complain of worsening weakness of the lower extremities. She has no dizziness or chest pain. Her troponin level is improving. She is scheduled for echo. Patient is being evaluated by cardiology.  Objective: Vital signs in last 24 hours: Temp:  [97.6 F (36.4 C)-98.6 F (37 C)] 97.6 F (36.4 C) (01/23 0443) Pulse Rate:  [73-87] 87 (01/23 0443) Resp:  [18-19] 18 (01/23 0443) BP: (118-158)/(54-74) 158/69 (01/23 0443) SpO2:  [94 %-97 %] 94 % (01/23 0443) Weight change:  Last BM Date: 02/13/16  Intake/Output from previous day: 01/22 0701 - 01/23 0700 In: 3160.4 [I.V.:3160.4] Out: -   PHYSICAL EXAM General appearance: alert and no distress Resp: clear to auscultation bilaterally Cardio: S1, S2 normal GI: soft, non-tender; bowel sounds normal; no masses,  no organomegaly Extremities: extremities normal, atraumatic, no cyanosis or edema  Lab Results:  Results for orders placed or performed during the hospital encounter of 02/13/16 (from the past 48 hour(s))  CBC with Differential     Status: Abnormal   Collection Time: 02/13/16  9:46 AM  Result Value Ref Range   WBC 11.1 (H) 4.0 - 10.5 K/uL   RBC 4.58 3.87 - 5.11 MIL/uL   Hemoglobin 14.2 12.0 - 15.0 g/dL   HCT 41.8 36.0 - 46.0 %   MCV 91.3 78.0 - 100.0 fL   MCH 31.0 26.0 - 34.0 pg   MCHC 34.0 30.0 - 36.0 g/dL   RDW 12.9 11.5 - 15.5 %   Platelets 226 150 - 400 K/uL   Neutrophils Relative % 82 %   Neutro Abs 9.0 (H) 1.7 - 7.7 K/uL   Lymphocytes Relative 10 %   Lymphs Abs 1.1 0.7 - 4.0 K/uL   Monocytes Relative 8 %   Monocytes Absolute 0.9 0.1 - 1.0 K/uL   Eosinophils Relative 0 %   Eosinophils Absolute 0.0 0.0 - 0.7 K/uL   Basophils Relative 0 %   Basophils Absolute 0.0 0.0 - 0.1 K/uL  Comprehensive metabolic panel     Status: Abnormal   Collection Time: 02/13/16  9:46 AM  Result Value Ref Range   Sodium 133 (L) 135 - 145 mmol/L   Potassium 3.5 3.5 - 5.1 mmol/L   Chloride 98 (L) 101 -  111 mmol/L   CO2 23 22 - 32 mmol/L   Glucose, Bld 196 (H) 65 - 99 mg/dL   BUN 15 6 - 20 mg/dL   Creatinine, Ser 0.97 0.44 - 1.00 mg/dL   Calcium 9.1 8.9 - 10.3 mg/dL   Total Protein 7.6 6.5 - 8.1 g/dL   Albumin 3.9 3.5 - 5.0 g/dL   AST 28 15 - 41 U/L   ALT 16 14 - 54 U/L   Alkaline Phosphatase 71 38 - 126 U/L   Total Bilirubin 0.6 0.3 - 1.2 mg/dL   GFR calc non Af Amer 58 (L) >60 mL/min   GFR calc Af Amer >60 >60 mL/min    Comment: (NOTE) The eGFR has been calculated using the CKD EPI equation. This calculation has not been validated in all clinical situations. eGFR's persistently <60 mL/min signify possible Chronic Kidney Disease.    Anion gap 12 5 - 15  Protime-INR     Status: Abnormal   Collection Time: 02/13/16  9:46 AM  Result Value Ref Range   Prothrombin Time 20.7 (H) 11.4 - 15.2 seconds   INR 1.75   CBG monitoring, ED     Status: Abnormal   Collection Time: 02/13/16  9:49 AM  Result Value Ref Range   Glucose-Capillary 216 (H) 65 - 99 mg/dL  Urinalysis, Routine w reflex microscopic     Status: Abnormal   Collection Time: 02/13/16 10:11 AM  Result Value Ref Range   Color, Urine YELLOW YELLOW   APPearance CLEAR CLEAR   Specific Gravity, Urine 1.030 1.005 - 1.030   pH 6.0 5.0 - 8.0   Glucose, UA >=500 (A) NEGATIVE mg/dL   Hgb urine dipstick MODERATE (A) NEGATIVE   Bilirubin Urine NEGATIVE NEGATIVE   Ketones, ur NEGATIVE NEGATIVE mg/dL   Protein, ur NEGATIVE NEGATIVE mg/dL   Nitrite NEGATIVE NEGATIVE   Leukocytes, UA TRACE (A) NEGATIVE   RBC / HPF 6-30 0 - 5 RBC/hpf   WBC, UA 6-30 0 - 5 WBC/hpf   Bacteria, UA NONE SEEN NONE SEEN  I-Stat Troponin, ED (not at [facility])     Status: Abnormal   Collection Time: 02/13/16 10:34 AM  Result Value Ref Range   Troponin i, poc 0.41 (HH) 0.00 - 0.08 ng/mL   Comment 3            Comment: Due to the release kinetics of cTnI, a negative result within the first hours of the onset of symptoms does not rule out myocardial infarction  with certainty. If myocardial infarction is still suspected, repeat the test at appropriate intervals.   TSH     Status: None   Collection Time: 02/13/16  3:09 PM  Result Value Ref Range   TSH 0.538 0.350 - 4.500 uIU/mL    Comment: Performed by a 3rd Generation assay with a functional sensitivity of <=0.01 uIU/mL.  Troponin I     Status: Abnormal   Collection Time: 02/13/16  3:09 PM  Result Value Ref Range   Troponin I 0.66 (HH) <0.03 ng/mL    Comment: CRITICAL RESULT CALLED TO, READ BACK BY AND VERIFIED WITH: DISHMON,M. AT 2458 ON 02/13/2016 BY EVA   Glucose, capillary     Status: Abnormal   Collection Time: 02/13/16  4:18 PM  Result Value Ref Range   Glucose-Capillary 145 (H) 65 - 99 mg/dL  MRSA PCR Screening     Status: None   Collection Time: 02/13/16  4:30 PM  Result Value Ref Range   MRSA by PCR NEGATIVE NEGATIVE    Comment:        The GeneXpert MRSA Assay (FDA approved for NASAL specimens only), is one component of a comprehensive MRSA colonization surveillance program. It is not intended to diagnose MRSA infection nor to guide or monitor treatment for MRSA infections.   Glucose, capillary     Status: Abnormal   Collection Time: 02/13/16  8:44 PM  Result Value Ref Range   Glucose-Capillary 106 (H) 65 - 99 mg/dL   Comment 1 Notify RN    Comment 2 Document in Chart   Troponin I     Status: Abnormal   Collection Time: 02/13/16  8:59 PM  Result Value Ref Range   Troponin I 0.78 (HH) <0.03 ng/mL    Comment: CRITICAL VALUE NOTED.  VALUE IS CONSISTENT WITH PREVIOUSLY REPORTED AND CALLED VALUE.  Troponin I     Status: Abnormal   Collection Time: 02/14/16  3:00 AM  Result Value Ref Range   Troponin I 0.59 (HH) <0.03 ng/mL    Comment: CRITICAL VALUE NOTED.  VALUE IS CONSISTENT WITH PREVIOUSLY REPORTED AND CALLED VALUE.  Protime-INR     Status: Abnormal   Collection Time: 02/14/16  3:00 AM  Result Value Ref  Range   Prothrombin Time 22.2 (H) 11.4 - 15.2 seconds   INR  1.92   CBC     Status: None   Collection Time: 02/14/16  3:00 AM  Result Value Ref Range   WBC 6.4 4.0 - 10.5 K/uL   RBC 4.13 3.87 - 5.11 MIL/uL   Hemoglobin 13.0 12.0 - 15.0 g/dL   HCT 38.1 36.0 - 46.0 %   MCV 92.3 78.0 - 100.0 fL   MCH 31.5 26.0 - 34.0 pg   MCHC 34.1 30.0 - 36.0 g/dL   RDW 13.1 11.5 - 15.5 %   Platelets 171 150 - 400 K/uL  Glucose, capillary     Status: None   Collection Time: 02/14/16  7:59 AM  Result Value Ref Range   Glucose-Capillary 77 65 - 99 mg/dL   Comment 1 Notify RN    Comment 2 Document in Chart   Glucose, capillary     Status: Abnormal   Collection Time: 02/14/16 11:18 AM  Result Value Ref Range   Glucose-Capillary 168 (H) 65 - 99 mg/dL   Comment 1 Notify RN    Comment 2 Document in Chart   Glucose, capillary     Status: Abnormal   Collection Time: 02/14/16  4:48 PM  Result Value Ref Range   Glucose-Capillary 104 (H) 65 - 99 mg/dL   Comment 1 Notify RN    Comment 2 Document in Chart   Glucose, capillary     Status: Abnormal   Collection Time: 02/14/16  8:59 PM  Result Value Ref Range   Glucose-Capillary 150 (H) 65 - 99 mg/dL  Protime-INR     Status: Abnormal   Collection Time: 02/15/16  5:40 AM  Result Value Ref Range   Prothrombin Time 22.9 (H) 11.4 - 15.2 seconds   INR 1.99   CBC     Status: Abnormal   Collection Time: 02/15/16  5:40 AM  Result Value Ref Range   WBC 3.7 (L) 4.0 - 10.5 K/uL   RBC 4.29 3.87 - 5.11 MIL/uL   Hemoglobin 13.5 12.0 - 15.0 g/dL   HCT 39.6 36.0 - 46.0 %   MCV 92.3 78.0 - 100.0 fL   MCH 31.5 26.0 - 34.0 pg   MCHC 34.1 30.0 - 36.0 g/dL   RDW 13.2 11.5 - 15.5 %   Platelets 168 150 - 400 K/uL  Troponin I     Status: Abnormal   Collection Time: 02/15/16  5:40 AM  Result Value Ref Range   Troponin I 0.23 (HH) <0.03 ng/mL    Comment: CRITICAL VALUE NOTED.  VALUE IS CONSISTENT WITH PREVIOUSLY REPORTED AND CALLED VALUE.  Glucose, capillary     Status: Abnormal   Collection Time: 02/15/16  7:46 AM  Result Value  Ref Range   Glucose-Capillary 59 (L) 65 - 99 mg/dL   Comment 1 Notify RN    Comment 2 Document in Chart     ABGS No results for input(s): PHART, PO2ART, TCO2, HCO3 in the last 72 hours.  Invalid input(s): PCO2 CULTURES Recent Results (from the past 240 hour(s))  MRSA PCR Screening     Status: None   Collection Time: 02/13/16  4:30 PM  Result Value Ref Range Status   MRSA by PCR NEGATIVE NEGATIVE Final    Comment:        The GeneXpert MRSA Assay (FDA approved for NASAL specimens only), is one component of a comprehensive MRSA colonization surveillance program. It is not intended to diagnose MRSA  infection nor to guide or monitor treatment for MRSA infections.    Studies/Results: Dg Chest 2 View  Result Date: 02/13/2016 CLINICAL DATA:  Confusion and difficulty walking.  Coughing. EXAM: CHEST  2 VIEW COMPARISON:  May 25, 2011 FINDINGS: The study is limited due to patient positioning. However, taking this into account, the heart, hila, mediastinum, lungs, and pleura are unremarkable. A nodule projected over lower thoracic vertebral body on the lateral view is unchanged since 2013 of no significance. IMPRESSION: No active cardiopulmonary disease. Electronically Signed   By: Dorise Bullion III M.D   On: 02/13/2016 11:06   Ct Head Wo Contrast  Result Date: 02/13/2016 CLINICAL DATA:  Difficulty walking since last night. Left-sided hemiplegia. EXAM: CT HEAD WITHOUT CONTRAST TECHNIQUE: Contiguous axial images were obtained from the base of the skull through the vertex without intravenous contrast. COMPARISON:  December 10, 2014 FINDINGS: Brain: No subdural, epidural, or subarachnoid hemorrhage. Ventricles and sulci are stable. Cerebellum, brainstem, and basal cisterns are normal. White matter changes and a few scattered lacunar infarcts are stable. No acute cortical ischemia or infarct. No mass, mass effect, or midline shift. Vascular: No hyperdense vessel or unexpected calcification. Skull:  Normal. Negative for fracture or focal lesion. Sinuses/Orbits: Previous sinus surgery. Mastoid air cells and middle ears are unchanged unremarkable. Other: None. IMPRESSION: 1. No acute intracranial process to explain the patient's symptoms. 2. Chronic white matter changes. Electronically Signed   By: Dorise Bullion III M.D   On: 02/13/2016 11:27    Medications: I have reviewed the patient's current medications.  Assesment:  Active Problems:   Type 2 diabetes mellitus with vascular disease ([place])   Essential hypertension, benign   DVT, lower extremity, recurrent ([facility])   History of cardiovascular disorder   Chronic anticoagulation   Hyperlipidemia   Elevated troponin    Plan:  Medications reviewed Continue telemetry  Cardiology consult appreciated D/C Iv fluid Echo as planned     LOS: 1 day   Teralyn Mullins 02/15/2016, 8:01 AM

## 2016-02-15 NOTE — Progress Notes (Signed)
CRITICAL VALUE ALERT  Critical value received:  CBG 59  Date of notification:  02/05/2016  Time of notification:  [id number]  Critical value read back:Yes.    Nurse who received alert:  Radene Gunning  MD notified (1st page):  Dr Legrand Rams   Time of first page:  [phone]  MD notified (2nd page):  Time of second page:  Responding MD:  Dr Legrand Rams  Time MD responded:  [phone]

## 2016-02-15 NOTE — Progress Notes (Signed)
   Progress Note  Patient Name: [name] Date of Encounter: 02/15/2016  Primary Cardiologist: Dr. Satira Sark  Please see follow-up echocardiogram report. LVEF is normal range without focal wall motion abnormalities. At this point no further cardiac workup is anticipated. If further, clinically significant SVT is noted could consider adding low-dose beta blocker. None has been observed beyond a brief episode on telemetry in the last 36 hours. We will sign off.  Signed, Rozann Lesches, MD  02/15/2016, 10:59 AM

## 2016-02-15 NOTE — Evaluation (Signed)
Physical Therapy Evaluation Patient Details Name: [name] MRN: [id number] DOB: 12/23/1945 Today's Date: 02/15/2016   History of Present Illness  71 y.o. female with hx of prior CVA with left hemiparesis, hx of anxiety, schizophrenia, HTN, hx of depression and psychosis, GERD, Hx of DVT on anticoagulation with Coumadin, DM, presented to the ER saying that she can't move her left leg.  She has no other neurological deficit.  Subsequently, she was able to move it.  On closer questioning, she said it was not a new phenomenon, she has trouble moving her leg because she has "knee arthritis" and it happens "quite frequently"  In the ER, she had head CT negative for any acute process.  Further work up included an Istat troponin of 0.41 and has trended down to 0.23, with negative EKG.  Dx: Left lower extremity paralysis:  Transient and recurrent.  Unlikely to be a recurrent TIA  Clinical Impression  Pt received in bed, and was marginally agreeable to PT evaluation.  Dtr present initially but left for mobility portion of evaluation.  Pt states that she lives at [facility], and ambulates at Mod (I) with a RW down to the dining hall.  Pt states she also uses a w/c sometimes.  She is independent with dressing, and requires assistance for bathing.  During PT evaluation she continuously states that she cannot move her L LE, but she is able to move it during evaluation.  She requires up to Mod A for bed mobility, and Min A for sit<>stand.  She required encouragement to attempt ambulation, and was able to ambulate 15ft with RW and up to [place].  She is recommended to return to [facility] with 24/7 supervision/assistance, and addition of HHPT.      Follow Up Recommendations Home health PT;Supervision/Assistance - 24 hour;Other (comment) (Return to [facility])    Equipment Recommendations  None recommended by PT    Recommendations for Other Services       Precautions / Restrictions  Precautions Precautions: Fall Precaution Comments: due to immobility Restrictions Weight Bearing Restrictions: No      Mobility  Bed Mobility Overal bed mobility: Needs Assistance Bed Mobility: Supine to Sit;Rolling Rolling: Mod assist (Pt expressed that she is not able to use her L LE.  )   Supine to sit: Mod assist;HOB elevated     General bed mobility comments: Pt states she cannot move her L LE, therefore PT assisted with advancement of L LE towards the EOB via reducing friction on the sheets, and educating on safe hand placement.  Pt then required assistance to get hips even on EOB via use of bed pad.  Pt seems to be capable of performing this however does not want to move.   Transfers Overall transfer level: Needs assistance Equipment used: Rolling walker (2 wheeled) Transfers: Sit to/from Stand Sit to Stand: Min assist         General transfer comment: Pt pulls up on RW  Ambulation/Gait Ambulation/Gait assistance: Min guard;Min assist Ambulation Distance (Feet): 15 Feet Assistive device: Rolling walker (2 wheeled) Gait Pattern/deviations: Step-through pattern;Ataxic;Narrow base of support;Trunk flexed   Gait velocity interpretation: <1.8 ft/sec, indicative of risk for recurrent falls General Gait Details: Pt has some difficulty with placement of L foot.  Pt requires extensive encouragement to perform short distance ambualtion, but states she ambulates to the dining hall by herself with her RW at [facility].   Stairs  Wheelchair Mobility    Modified Rankin (Stroke Patients Only)       Balance Overall balance assessment: Needs assistance Sitting-balance support: Feet supported;Bilateral upper extremity supported Sitting balance-Leahy Scale: Good     Standing balance support: Bilateral upper extremity supported Standing balance-Leahy Scale: Poor Standing balance comment: B UE's supported on RW.                               Pertinent Vitals/Pain Pain Assessment: 0-10 Pain Score: 10-Worst pain ever Pain Location: R knee and back.  Pt states it's just pain.  Pain Intervention(s): Premedicated before session;Limited activity within patient's tolerance;Monitored during session;Repositioned    Home Living       Type of Home: Assisted living [facility])         Home Equipment: Gilford Rile - 2 wheels;Wheelchair - manual;Hospital bed      Prior Function Level of Independence: Independent with assistive device(s)   Gait / Transfers Assistance Needed: Pt ambulates with RW at [facility].  Pt states she is able to ambualte down to the dining room and back.    ADL's / Homemaking Assistance Needed: independent with dressing, assistance with bathing.          Hand Dominance   Dominant Hand: Right    Extremity/Trunk Assessment   Upper Extremity Assessment Upper Extremity Assessment: Generalized weakness    Lower Extremity Assessment Lower Extremity Assessment: Generalized weakness;LLE deficits/detail LLE Deficits / Details: Pt states she is not able to move her L LE, however she was able to ambulate 36ft with RW.        Communication   Communication: No difficulties  Cognition Arousal/Alertness: Awake/alert Behavior During Therapy: WFL for tasks assessed/performed Overall Cognitive Status: Within Functional Limits for tasks assessed                      General Comments      Exercises     Assessment/Plan    PT Assessment Patient needs continued PT services  PT Problem List Decreased strength;Decreased activity tolerance;Decreased balance;Decreased mobility;Decreased safety awareness;Decreased knowledge of use of DME;Decreased cognition;Decreased knowledge of precautions;Pain          PT Treatment Interventions DME instruction;Gait training;Functional mobility training;Therapeutic activities;Therapeutic exercise;Balance training;Cognitive remediation;Patient/family  education;Wheelchair mobility training    PT Goals (Current goals can be found in the Care Plan section)  Acute Rehab PT Goals Patient Stated Goal: Pt wishes to go home.  PT Goal Formulation: With patient Time For Goal Achievement: 02/22/16 Potential to Achieve Goals: Fair    Frequency Min 3X/week   Barriers to discharge        Co-evaluation               End of Session Equipment Utilized During Treatment: Gait belt Activity Tolerance: Patient limited by fatigue Patient left: in chair;with call bell/phone within reach;with chair alarm set Nurse Communication: Mobility status (mobility sheet left haninging in the room. )    Functional Assessment Tool Used: [employer] "6-clicks"  Functional Limitation: Mobility: Walking and moving around Mobility: Walking and Moving Around Current Status [phone]): At least 40 percent but less than 60 percent impaired, limited or restricted Mobility: Walking and Moving Around Goal Status [phone]): At least 20 percent but less than 40 percent impaired, limited or restricted    Time: [id number] PT Time Calculation (min) (ACUTE ONLY): 33 min   Charges:   PT Evaluation $PT Eval  Low Complexity: 1 Procedure PT Treatments $Gait Training: 8-22 mins   PT G Codes:   PT G-Codes **NOT FOR INPATIENT CLASS** Functional Assessment Tool Used: [employer] "6-clicks"  Functional Limitation: Mobility: Walking and moving around Mobility: Walking and Moving Around Current Status [phone]): At least 40 percent but less than 60 percent impaired, limited or restricted Mobility: Walking and Moving Around Goal Status [phone]): At least 20 percent but less than 40 percent impaired, limited or restricted   Beth Kenitra Leventhal, PT, DPT X: [phone]

## 2016-02-15 NOTE — Progress Notes (Signed)
Progress Note  Patient Name: [name] Date of Encounter: 02/15/2016  Primary Cardiologist: Courtney Sark, MD  Subjective   Complaining of lower back pain and right knee pain. No chest pain.  Inpatient Medications    Scheduled Meds: . ARIPiprazole  2 mg Oral Daily  . canagliflozin  100 mg Oral Daily  . clonazePAM  0.5 mg Oral TID  . darifenacin  7.5 mg Oral Daily  . DULoxetine  120 mg Oral Daily  . enalapril  2.5 mg Oral Daily  . ferrous sulfate  325 mg Oral Q breakfast  . insulin aspart  0-20 Units Subcutaneous TID WC  . insulin aspart  0-5 Units Subcutaneous QHS  . insulin aspart  10 Units Subcutaneous TID WC  . insulin detemir  75 Units Subcutaneous QHS  . latanoprost  1 drop Both Eyes QHS  . linaclotide  290 mcg Oral QAC breakfast  . metoprolol succinate  12.5 mg Oral BID  . mirtazapine  30 mg Oral QHS  . pantoprazole  80 mg Oral Daily  . polyvinyl alcohol  1 drop Both Eyes TID  . risperiDONE  2 mg Oral BID  . simvastatin  20 mg Oral QHS  . sodium chloride flush  3 mL Intravenous Q12H  . tiZANidine  2 mg Oral Daily  . [START ON 02/18/2016] Vitamin D (Ergocalciferol)  50,000 Units Oral Q7 days  . Warfarin - Pharmacist Dosing Inpatient   Does not apply Q24H  . zolpidem  5 mg Oral QHS   Continuous Infusions: . sodium chloride 1,000 mL (02/15/16 0446)   PRN Meds: acetaminophen, calcium carbonate, docusate sodium   Vital Signs    Vitals:   02/14/16 0555 02/14/16 1303 02/14/16 2056 02/15/16 0443  BP: (!) 120/58 (!) 118/54 118/74 (!) 158/69  Pulse: 80 73 81 87  Resp: 18 18 19 18   Temp: 99.4 F (37.4 C) 98.6 F (37 C) 98.2 F (36.8 C) 97.6 F (36.4 C)  TempSrc: Oral Oral Oral Oral  SpO2: 94% 96% 97% 94%  Weight:      Height:        Intake/Output Summary (Last 24 hours) at 02/15/16 0740 Last data filed at 02/15/16 0617  Gross per 24 hour  Intake          3160.42 ml  Output                0 ml  Net          3160.42 ml   Filed Weights   02/13/16 0940 02/13/16 1457  Weight: 170 lb (77.1 kg) 169 lb (76.7 kg)    Telemetry   I personally reviewed telemetry which shows sinus rhythm.  Physical Exam   GEN: No acute distress. Frail Neck: No JVD Cardiac: RRR, distant heart sounds, 1/6 systolic murmur.  Respiratory: Inspiratory crackles, with diminished breath sounds in the bases. Poor inspiratory effort.  GI: Soft, nontender, non-distended  MS: No edema.  Labs    Chemistry Recent Labs Lab 02/13/16 0946  NA 133*  K 3.5  CL 98*  CO2 23  GLUCOSE 196*  BUN 15  CREATININE 0.97  CALCIUM 9.1  PROT 7.6  ALBUMIN 3.9  AST 28  ALT 16  ALKPHOS 71  BILITOT 0.6  GFRNONAA 58*  GFRAA >60  ANIONGAP 12     Hematology Recent Labs Lab 02/13/16 0946 02/14/16 0300 02/15/16 0540  WBC 11.1* 6.4 3.7*  RBC 4.58 4.13 4.29  HGB 14.2 13.0 13.5  HCT  41.8 38.1 39.6  MCV 91.3 92.3 92.3  MCH 31.0 31.5 31.5  MCHC 34.0 34.1 34.1  RDW 12.9 13.1 13.2  PLT 226 171 168    Cardiac Enzymes Recent Labs Lab 02/13/16 1509 02/13/16 2059 02/14/16 0300 02/15/16 0540  TROPONINI 0.66* 0.78* 0.59* 0.23*    Recent Labs Lab 02/13/16 1034  TROPIPOC 0.41*     Radiology    Dg Chest 2 View  Result Date: 02/13/2016 CLINICAL DATA:  Confusion and difficulty walking.  Coughing. EXAM: CHEST  2 VIEW COMPARISON:  May 25, 2011 FINDINGS: The study is limited due to patient positioning. However, taking this into account, the heart, hila, mediastinum, lungs, and pleura are unremarkable. A nodule projected over lower thoracic vertebral body on the lateral view is unchanged since 2013 of no significance. IMPRESSION: No active cardiopulmonary disease. Electronically Signed   By: Dorise Bullion III M.D   On: 02/13/2016 11:06   Ct Head Wo Contrast  Result Date: 02/13/2016 CLINICAL DATA:  Difficulty walking since last night. Left-sided hemiplegia. EXAM: CT HEAD WITHOUT CONTRAST TECHNIQUE: Contiguous axial images were obtained from the base of the  skull through the vertex without intravenous contrast. COMPARISON:  December 10, 2014 FINDINGS: Brain: No subdural, epidural, or subarachnoid hemorrhage. Ventricles and sulci are stable. Cerebellum, brainstem, and basal cisterns are normal. White matter changes and a few scattered lacunar infarcts are stable. No acute cortical ischemia or infarct. No mass, mass effect, or midline shift. Vascular: No hyperdense vessel or unexpected calcification. Skull: Normal. Negative for fracture or focal lesion. Sinuses/Orbits: Previous sinus surgery. Mastoid air cells and middle ears are unchanged unremarkable. Other: None. IMPRESSION: 1. No acute intracranial process to explain the patient's symptoms. 2. Chronic white matter changes. Electronically Signed   By: Dorise Bullion III M.D   On: 02/13/2016 11:27    Cardiac Studies   Pending echocardiogram.  Patient Profile     71 y.o. female admitted from [facility] with worsened left leg weakness, has a prior history of stroke and recurrent DVTs on Coumadin, managed by Dr. Legrand Rams through our anticoagulation clinic. As part of her workup, troponin levels were obtained and found to be abnormal as outlined above, overall mild elevation and no chest pain reported. Echocardiogram pending. She did have a brief episode of SVT by telemetry monitoring although not clearly symptomatic.  Assessment & Plan    1. Elevated Troponin: No chest pain or dyspnea. Not conclusive for ACS, more than likely reflects demand ischemia. Awaiting echocardiogram today to assess for focal wall motion abnormalities.. No planned ischemic testing at this time.  2. Brief episode of SVT by telemetry monitoring: Not clearly symptomatic. If she has had intermittent SVT, this could be another explanation for her troponin elevations. No further events noted however.  3. Hx of Recurrent DVT: On coumadin therapy. INR 1.99 this am.   Signed, Jory Sims, NP  02/15/2016, 7:40 AM      Attending note:  Patient seen and examined. Modified above note by Ms. Lawrence NP including the assessment and plan section which reflect my recommendations. Telemetry does not show any further SVT. We will obtain an echocardiogram to assess for wall motion abnormalities, although at this point do not anticipate an aggressive ischemic workup.  [name], M.D., F.A.C.C.

## 2016-02-16 LAB — CBC
HCT: 42.4 % (ref 36.0–46.0)
Hemoglobin: 14.8 g/dL (ref 12.0–15.0)
MCH: 31.4 pg (ref 26.0–34.0)
MCHC: 34.9 g/dL (ref 30.0–36.0)
MCV: 90 fL (ref 78.0–100.0)
Platelets: 210 10*3/uL (ref 150–400)
RBC: 4.71 MIL/uL (ref 3.87–5.11)
RDW: 12.8 % (ref 11.5–15.5)
WBC: 8.3 10*3/uL (ref 4.0–10.5)

## 2016-02-16 LAB — GLUCOSE, CAPILLARY
Glucose-Capillary: 187 mg/dL — ABNORMAL HIGH (ref 65–99)
Glucose-Capillary: 217 mg/dL — ABNORMAL HIGH (ref 65–99)
Glucose-Capillary: 195 mg/dL — ABNORMAL HIGH (ref 65–99)

## 2016-02-16 LAB — PROTIME-INR
INR: 2.14
Prothrombin Time: 24.3 seconds — ABNORMAL HIGH (ref 11.4–15.2)

## 2016-02-16 MED ORDER — LOPERAMIDE HCL 2 MG PO CAPS
4.0000 mg | ORAL_CAPSULE | Freq: Once | ORAL | Status: AC
  Administered 2016-02-16: 4 mg via ORAL
  Filled 2016-02-16: qty 2

## 2016-02-16 MED ORDER — WARFARIN SODIUM 5 MG PO TABS: 2.5000 mg | ORAL_TABLET | Freq: Once | ORAL | Status: DC

## 2016-02-16 NOTE — Progress Notes (Signed)
ANTICOAGULATION CONSULT NOTE -   Pharmacy Consult for Warfarin Indication: VTE Treatment  Allergies  Allergen Reactions  . Sulfa Antibiotics Rash  . Sulfonamide Derivatives Rash    REACTION: rash   Patient Measurements: Height: 5\' 4"  (162.6 cm) Weight: 169 lb (76.7 kg) IBW/kg (Calculated) : 54.7  Vital Signs: Temp: 98.2 F (36.8 C) (01/24 0538) Temp Source: Oral (01/24 0538) BP: 150/69 (01/24 0930) Pulse Rate: 88 (01/24 0930)  Labs:  Recent Labs  02/13/16 2059  02/14/16 0300 02/15/16 0540 02/16/16 0616  HGB  --   < > 13.0 13.5 14.8  HCT  --   --  38.1 39.6 42.4  PLT  --   --  171 168 210  LABPROT  --   --  22.2* 22.9* 24.3*  INR  --   --  1.92 1.99 2.14  TROPONINI 0.78*  --  0.59* 0.23*  --   < > = values in this interval not displayed.  Estimated Creatinine Clearance: 54.1 mL/min (by C-G formula based on SCr of 0.97 mg/dL).  Medical History: Past Medical History:  Diagnosis Date  . Anxiety   . Arthritis   . Depression    History of psychosis and previous suicide attempt  . DVT, lower extremity, recurrent (HCC)    Long-term Coumadin per Dr. Legrand Rams  . Essential hypertension   . GERD (gastroesophageal reflux disease)   . Hemiplegia ([place]) 2010   Left side  . History of stroke    Acute infarct and right cerebral white matter small vessel disease 12/10  . Leg DVT (deep venous thromboembolism), acute ([place]) 2006  . Schizophrenia ([place])   . Stroke ([place])   . Type 2 diabetes mellitus (HCC)    Medications:  Prescriptions Prior to Admission  Medication Sig Dispense Refill Last Dose  . acetaminophen (TYLENOL) 325 MG tablet Take 650 mg by mouth every 6 (six) hours as needed. For fever > 101    unknown  . ARIPiprazole (ABILIFY) 2 MG tablet    02/12/2016 at Unknown time  . calcium carbonate (TUMS - DOSED IN MG ELEMENTAL CALCIUM) 500 MG chewable tablet Chew 1 tablet by mouth 4 (four) times daily as needed for indigestion or heartburn.    unknown  . canagliflozin  (INVOKANA) 100 MG TABS tablet Take 100 mg by mouth daily.   02/13/2016 at Unknown time  . clonazePAM (KLONOPIN) 0.5 MG tablet Take 0.5 mg by mouth 3 (three) times daily.    02/13/2016 at Unknown time  . DULoxetine (CYMBALTA) 60 MG capsule Take 120 mg by mouth daily.    02/13/2016 at Unknown time  . enalapril (VASOTEC) 2.5 MG tablet    02/13/2016 at Unknown time  . ferrous sulfate (EQL SLOW RELEASE IRON) 160 (50 Fe) MG TBCR SR tablet Take 1 tablet (160 mg total) by mouth daily. 30 each 11 02/12/2016 at Unknown time  . HYDROcodone-acetaminophen (NORCO) 5-325 MG per tablet Take 1 tablet by mouth 3 (three) times daily.    02/13/2016 at Unknown time  . latanoprost (XALATAN) 0.005 % ophthalmic solution Place 1 drop into both eyes at bedtime.   02/12/2016 at Unknown time  . LEVEMIR FLEXTOUCH 100 UNIT/ML Pen INJECT 100 UNITS SUBCUTANEOUSLY AT BEDTIME. 30 mL 2 02/12/2016 at Unknown time  . LINZESS 290 MCG CAPS capsule TAKE 1 CAPSULE BY MOUTH EACH MORNING 30 MINUTES BEFORE BREAKFAST. 30 capsule 11 02/13/2016 at Unknown time  . loperamide (IMODIUM) 2 MG capsule Take 4 mg by mouth daily as needed for diarrhea  or loose stools. Take 2 capsules initially then take 2 after each loose stool per MAR   unknown  . metoprolol succinate (TOPROL-XL) 25 MG 24 hr tablet Take 12.5 mg by mouth 2 (two) times daily.    02/13/2016 at 0800  . mirtazapine (REMERON) 30 MG tablet Take 30 mg by mouth at bedtime.     02/12/2016 at Unknown time  . NOVOLOG FLEXPEN 100 UNIT/ML FlexPen SS: 251-300 ADD 3 UNITS; 301-350 ADD 4 UNITS; 351-400 ADD 5 UNITS; 401-450 ADD 6 UNITS AND CALL DOCTOR. 15 mL 2 02/13/2016 at Unknown time  . NOVOLOG FLEXPEN 100 UNIT/ML FlexPen INJECT 35 UNITS SUBCUTANEOUSLY BEFORE MEALS IF BS IS BETWEEN 90-150. 150-200 ADD 1 UNIT , 201-250 ADD 2 UNITS 15 mL 2 02/13/2016 at Unknown time  . omeprazole (PRILOSEC) 40 MG capsule Take 40 mg by mouth daily.   02/13/2016 at Unknown time  . polyvinyl alcohol (ARTIFICIAL TEARS) 1.4 % ophthalmic  solution Place 1 drop into both eyes 3 (three) times daily.   02/13/2016 at Unknown time  . risperiDONE (RISPERDAL) 2 MG tablet Take 2 mg by mouth 2 (two) times daily.   02/13/2016 at Unknown time  . simvastatin (ZOCOR) 20 MG tablet Take 20 mg by mouth at bedtime.   02/12/2016 at Unknown time  . solifenacin (VESICARE) 5 MG tablet Take 5 mg by mouth daily.   02/13/2016 at Unknown time  . tiZANidine (ZANAFLEX) 2 MG tablet Take 2 mg by mouth daily.   02/12/2016 at Unknown time  . Vitamin D, Ergocalciferol, (DRISDOL) 50000 UNITS CAPS capsule Take 50,000 Units by mouth every 7 (seven) days. Takes on Fridays.   Past Week at Unknown time  . warfarin (COUMADIN) 5 MG tablet TAKE 1 TABLET BY MOUTH ONCE DAILY. 30 tablet 6 02/12/2016 at 0800  . zolpidem (AMBIEN) 10 MG tablet Take 10 mg by mouth at bedtime.   02/12/2016 at Unknown time  . docusate sodium (COLACE) 100 MG capsule Take 1 capsule (100 mg total) by mouth daily as needed for mild constipation. IF LINZESS DOES NOT FACILITATE A BM ON ANY GIVEN DAY. (Patient not taking: Reported on 02/13/2016) 30 capsule 3 Not Taking at Unknown time   Assessment: Continuation of warfarin PTA. Patient with history of DVT on chronic anticoagulation with Coumadin. Patients regimen per anticoagulation clinic 01/31/2016 is 5mg  daily except 2.5mg  on Monday, Wednesday, and Friday. INR today is therapeutic.   Goal of Therapy:  INR 2-3 Monitor platelets by anticoagulation protocol: Yes   Plan:  Warfarin 2.5 mg po today PT/INR daily Monitor for S/S of bleeding  Isac Sarna, BS Vena [place], BCPS Clinical Pharmacist Pager [phone] 02/16/2016,11:42 AM

## 2016-02-16 NOTE — Clinical Social Work Note (Signed)
Pt d/c today back to [facility]. Pt's DSS guardian and facility aware and agreeable. Facility will pick up pt this afternoon.   Benay Pike, [place]

## 2016-02-16 NOTE — NC FL2 (Signed)
[place] LEVEL OF CARE SCREENING TOOL     IDENTIFICATION  Patient Name: [name] Birthdate: 03/17/1945 Sex: female Admission Date (Current Location): 02/13/2016  [place] and [place] Number:  [place] [id number] [place] and Address:  [facility] [address], [place]      Provider Number: [id number]  Attending Physician Name and Address:  [name], MD  Relative Name and Phone Number:       Current Level of Care: Hospital Recommended Level of Care: [place] Prior Approval Number:    Date Approved/Denied:   PASRR Number:    Discharge Plan: Other (Comment) (ALF)    Current Diagnoses: Patient Active Problem List   Diagnosis Date Noted  . Elevated troponin 02/13/2016  . IDA (iron deficiency anemia) 04/06/2015  . Hyperlipidemia 11/06/2014  . Obesity due to excess calories 11/06/2014  . Sedentary lifestyle 11/06/2014  . Anemia 10/21/2014  . Rectal bleeding 10/21/2014  . Encounter for therapeutic drug monitoring 03/24/2013  . Chronic anticoagulation 04/09/2010  . Type 2 diabetes mellitus with vascular disease ([place]) 03/24/2010  . Essential hypertension, benign 03/24/2010  . DVT, lower extremity, recurrent ([place]) 03/24/2010  . History of cardiovascular disorder 03/24/2010    Orientation RESPIRATION BLADDER Height & Weight     Self, Place, Situation  Normal Continent Weight: 169 lb (76.7 kg) Height:  5\' 4"  (162.6 cm)  BEHAVIORAL SYMPTOMS/MOOD NEUROLOGICAL BOWEL NUTRITION STATUS  Other (Comment) (none)  (n/a) Continent Diet (Heart healthy/carb modified)  AMBULATORY STATUS COMMUNICATION OF NEEDS Skin   Limited Assist Verbally Other (Comment) (Moisture associated skin damage to groin, pelvis)                       Personal Care Assistance Level of Assistance  Bathing, Feeding, Dressing Bathing Assistance: Limited assistance Feeding assistance: Limited assistance Dressing Assistance: Limited  assistance     Functional Limitations Info  Sight, Hearing, Speech Sight Info: Impaired Hearing Info: Adequate Speech Info: Adequate    SPECIAL CARE FACTORS FREQUENCY                       Contractures      Additional Factors Info  Insulin Sliding Scale, Psychotropic Code Status Info: Full code Allergies Info: Sulfa Antibiotics, Sulfonamide Derivatives Psychotropic Info: Risperdal         Current Medications (02/16/2016):  This is the current hospital active medication list Current Facility-Administered Medications  Medication Dose Route Frequency Provider Last Rate Last Dose  . acetaminophen (TYLENOL) tablet 650 mg  650 mg Oral Q6H PRN Orvan Falconer, MD   650 mg at 02/16/16 0605  . ARIPiprazole (ABILIFY) tablet 2 mg  2 mg Oral Daily Orvan Falconer, MD   2 mg at 02/15/16 1022  . calcium carbonate (TUMS - dosed in mg elemental calcium) chewable tablet 200 mg of elemental calcium  1 tablet Oral QID PRN Orvan Falconer, MD      . canagliflozin [facility]) tablet 100 mg  100 mg Oral Daily Orvan Falconer, MD   100 mg at 02/15/16 1021  . clonazePAM (KLONOPIN) tablet 0.5 mg  0.5 mg Oral TID Orvan Falconer, MD   0.5 mg at 02/15/16 2238  . darifenacin (ENABLEX) 24 hr tablet 7.5 mg  7.5 mg Oral Daily Orvan Falconer, MD   7.5 mg at 02/15/16 1021  . docusate sodium (COLACE) capsule 100 mg  100 mg Oral Daily PRN Orvan Falconer, MD      .  DULoxetine (CYMBALTA) DR capsule 120 mg  120 mg Oral Daily Orvan Falconer, MD   120 mg at 02/15/16 1022  . enalapril (VASOTEC) tablet 2.5 mg  2.5 mg Oral Daily Orvan Falconer, MD   2.5 mg at 02/15/16 1021  . ferrous sulfate tablet 325 mg  325 mg Oral Q breakfast Orvan Falconer, MD   325 mg at 02/16/16 [id number]  . insulin aspart (novoLOG) injection 0-20 Units  0-20 Units Subcutaneous TID WC Orvan Falconer, MD   4 Units at 02/16/16 0810  . insulin aspart (novoLOG) injection 0-5 Units  0-5 Units Subcutaneous QHS Orvan Falconer, MD      . insulin aspart (novoLOG) injection 10 Units  10 Units Subcutaneous TID WC Orvan Falconer, MD   10  Units at 02/16/16 [phone]  . insulin detemir (LEVEMIR) injection 75 Units  75 Units Subcutaneous QHS Orvan Falconer, MD   75 Units at 02/14/16 2125  . latanoprost (XALATAN) 0.005 % ophthalmic solution 1 drop  1 drop Both Eyes QHS Orvan Falconer, MD   1 drop at 02/15/16 2240  . linaclotide (LINZESS) capsule 290 mcg  290 mcg Oral QAC breakfast Orvan Falconer, MD   290 mcg at 02/16/16 [phone]  . metoprolol succinate (TOPROL-XL) 24 hr tablet 12.5 mg  12.5 mg Oral BID Orvan Falconer, MD   12.5 mg at 02/15/16 2238  . mirtazapine (REMERON) tablet 30 mg  30 mg Oral QHS Orvan Falconer, MD   30 mg at 02/15/16 2238  . pantoprazole (PROTONIX) EC tablet 80 mg  80 mg Oral Daily Orvan Falconer, MD   80 mg at 02/15/16 1021  . polyvinyl alcohol (LIQUIFILM TEARS) 1.4 % ophthalmic solution 1 drop  1 drop Both Eyes TID Orvan Falconer, MD   1 drop at 02/15/16 2240  . risperiDONE (RISPERDAL) tablet 2 mg  2 mg Oral BID Orvan Falconer, MD   2 mg at 02/15/16 2238  . simvastatin (ZOCOR) tablet 20 mg  20 mg Oral QHS Orvan Falconer, MD   20 mg at 02/15/16 2238  . sodium chloride flush (NS) 0.9 % injection 3 mL  3 mL Intravenous Q12H Orvan Falconer, MD   3 mL at 02/15/16 2241  . tiZANidine (ZANAFLEX) tablet 2 mg  2 mg Oral Daily Orvan Falconer, MD   2 mg at 02/15/16 1020  . [START ON 02/18/2016] Vitamin D (Ergocalciferol) (DRISDOL) capsule 50,000 Units  50,000 Units Oral Q7 days Orvan Falconer, MD      . Warfarin - Pharmacist Dosing Inpatient   Does not apply Q24H [name], MD      . zolpidem (AMBIEN) tablet 5 mg  5 mg Oral QHS Orvan Falconer, MD   5 mg at 02/15/16 2238     Discharge Medications: Medication List    TAKE these medications   acetaminophen 325 MG tablet Commonly known as:  TYLENOL Take 650 mg by mouth every 6 (six) hours as needed. For fever > 101   ARIPiprazole 2 MG tablet Commonly known as:  ABILIFY   ARTIFICIAL TEARS 1.4 % ophthalmic solution Generic drug:  polyvinyl alcohol Place 1 drop into both eyes 3 (three) times daily.   calcium carbonate 500 MG chewable  tablet Commonly known as:  TUMS - dosed in mg elemental calcium Chew 1 tablet by mouth 4 (four) times daily as needed for indigestion or heartburn.   clonazePAM 0.5 MG tablet Commonly known as:  KLONOPIN Take 0.5 mg by mouth 3 (three) times daily.   docusate sodium 100 MG capsule  Commonly known as:  COLACE Take 1 capsule (100 mg total) by mouth daily as needed for mild constipation. IF LINZESS DOES NOT FACILITATE A BM ON ANY GIVEN DAY.   DULoxetine 60 MG capsule Commonly known as:  CYMBALTA Take 120 mg by mouth daily.   enalapril 2.5 MG tablet Commonly known as:  VASOTEC   ferrous sulfate 160 (50 Fe) MG Tbcr SR tablet Commonly known as:  EQL SLOW RELEASE IRON Take 1 tablet (160 mg total) by mouth daily.   HYDROcodone-acetaminophen 5-325 MG tablet Commonly known as:  NORCO/VICODIN Take 1 tablet by mouth 3 (three) times daily.   INVOKANA 100 MG Tabs tablet Generic drug:  canagliflozin Take 100 mg by mouth daily.   latanoprost 0.005 % ophthalmic solution Commonly known as:  XALATAN Place 1 drop into both eyes at bedtime.   LEVEMIR FLEXTOUCH 100 UNIT/ML Pen Generic drug:  Insulin Detemir INJECT 100 UNITS SUBCUTANEOUSLY AT BEDTIME.   LINZESS 290 MCG Caps capsule Generic drug:  linaclotide TAKE 1 CAPSULE BY MOUTH EACH MORNING 30 MINUTES BEFORE BREAKFAST.   loperamide 2 MG capsule Commonly known as:  IMODIUM Take 4 mg by mouth daily as needed for diarrhea or loose stools. Take 2 capsules initially then take 2 after each loose stool per [facility]   metoprolol succinate 25 MG 24 hr tablet Commonly known as:  TOPROL-XL Take 12.5 mg by mouth 2 (two) times daily.   mirtazapine 30 MG tablet Commonly known as:  REMERON Take 30 mg by mouth at bedtime.   NOVOLOG FLEXPEN 100 UNIT/ML FlexPen Generic drug:  insulin aspart SS: 251-300 ADD 3 UNITS; 301-350 ADD 4 UNITS; 351-400 ADD 5 UNITS; 401-450 ADD 6 UNITS AND CALL DOCTOR.   NOVOLOG FLEXPEN 100 UNIT/ML  FlexPen Generic drug:  insulin aspart INJECT 35 UNITS SUBCUTANEOUSLY BEFORE MEALS IF BS IS BETWEEN 90-150. 150-200 ADD 1 UNIT , 201-250 ADD 2 UNITS   omeprazole 40 MG capsule Commonly known as:  PRILOSEC Take 40 mg by mouth daily.   risperiDONE 2 MG tablet Commonly known as:  RISPERDAL Take 2 mg by mouth 2 (two) times daily.   simvastatin 20 MG tablet Commonly known as:  ZOCOR Take 20 mg by mouth at bedtime.   solifenacin 5 MG tablet Commonly known as:  VESICARE Take 5 mg by mouth daily.   tiZANidine 2 MG tablet Commonly known as:  ZANAFLEX Take 2 mg by mouth daily.   Vitamin D (Ergocalciferol) 50000 units Caps capsule Commonly known as:  DRISDOL Take 50,000 Units by mouth every 7 (seven) days. Takes on Fridays.   warfarin 5 MG tablet Commonly known as:  COUMADIN TAKE 1 TABLET BY MOUTH ONCE DAILY.   zolpidem 10 MG tablet Commonly known as:  AMBIEN Take 10 mg by mouth at bedtime.        Relevant Imaging Results:  Relevant Lab Results:   Additional [place], [place], [place]

## 2016-02-16 NOTE — Progress Notes (Signed)
Pt discharged in stable condition to [facility] via [employer] and vehicle.  Discharge instructional packet provided to [facility]. PIV removed intact and w/o S&S of complications.

## 2016-02-16 NOTE — Discharge Summary (Signed)
Physician Discharge Summary  Patient ID: [name] MRN: [id number] DOB/AGE: February 15, 1945 71 y.o. Primary Care Physician:Jaren Kearn, MD Admit date: 02/13/2016 Discharge date: 02/16/2016    Discharge Diagnoses:   Active Problems:   Type 2 diabetes mellitus with vascular disease ([place])   Essential hypertension, benign   DVT, lower extremity, recurrent (HCC)   History of cardiovascular disorder   Chronic anticoagulation   Hyperlipidemia   Elevated troponin   Allergies as of 02/16/2016      Reactions   Sulfa Antibiotics Rash   Sulfonamide Derivatives Rash   REACTION: rash      Medication List    TAKE these medications   acetaminophen 325 MG tablet Commonly known as:  TYLENOL Take 650 mg by mouth every 6 (six) hours as needed. For fever > 101   ARIPiprazole 2 MG tablet Commonly known as:  ABILIFY   ARTIFICIAL TEARS 1.4 % ophthalmic solution Generic drug:  polyvinyl alcohol Place 1 drop into both eyes 3 (three) times daily.   calcium carbonate 500 MG chewable tablet Commonly known as:  TUMS - dosed in mg elemental calcium Chew 1 tablet by mouth 4 (four) times daily as needed for indigestion or heartburn.   clonazePAM 0.5 MG tablet Commonly known as:  KLONOPIN Take 0.5 mg by mouth 3 (three) times daily.   docusate sodium 100 MG capsule Commonly known as:  COLACE Take 1 capsule (100 mg total) by mouth daily as needed for mild constipation. IF LINZESS DOES NOT FACILITATE A BM ON ANY GIVEN DAY.   DULoxetine 60 MG capsule Commonly known as:  CYMBALTA Take 120 mg by mouth daily.   enalapril 2.5 MG tablet Commonly known as:  VASOTEC   ferrous sulfate 160 (50 Fe) MG Tbcr SR tablet Commonly known as:  EQL SLOW RELEASE IRON Take 1 tablet (160 mg total) by mouth daily.   HYDROcodone-acetaminophen 5-325 MG tablet Commonly known as:  NORCO/VICODIN Take 1 tablet by mouth 3 (three) times daily.   INVOKANA 100 MG Tabs tablet Generic drug:  canagliflozin Take 100 mg  by mouth daily.   latanoprost 0.005 % ophthalmic solution Commonly known as:  XALATAN Place 1 drop into both eyes at bedtime.   LEVEMIR FLEXTOUCH 100 UNIT/ML Pen Generic drug:  Insulin Detemir INJECT 100 UNITS SUBCUTANEOUSLY AT BEDTIME.   LINZESS 290 MCG Caps capsule Generic drug:  linaclotide TAKE 1 CAPSULE BY MOUTH EACH MORNING 30 MINUTES BEFORE BREAKFAST.   loperamide 2 MG capsule Commonly known as:  IMODIUM Take 4 mg by mouth daily as needed for diarrhea or loose stools. Take 2 capsules initially then take 2 after each loose stool per [facility]   metoprolol succinate 25 MG 24 hr tablet Commonly known as:  TOPROL-XL Take 12.5 mg by mouth 2 (two) times daily.   mirtazapine 30 MG tablet Commonly known as:  REMERON Take 30 mg by mouth at bedtime.   NOVOLOG FLEXPEN 100 UNIT/ML FlexPen Generic drug:  insulin aspart SS: 251-300 ADD 3 UNITS; 301-350 ADD 4 UNITS; 351-400 ADD 5 UNITS; 401-450 ADD 6 UNITS AND CALL DOCTOR.   NOVOLOG FLEXPEN 100 UNIT/ML FlexPen Generic drug:  insulin aspart INJECT 35 UNITS SUBCUTANEOUSLY BEFORE MEALS IF BS IS BETWEEN 90-150. 150-200 ADD 1 UNIT , 201-250 ADD 2 UNITS   omeprazole 40 MG capsule Commonly known as:  PRILOSEC Take 40 mg by mouth daily.   risperiDONE 2 MG tablet Commonly known as:  RISPERDAL Take 2 mg by mouth 2 (two) times daily.   simvastatin 20  MG tablet Commonly known as:  ZOCOR Take 20 mg by mouth at bedtime.   solifenacin 5 MG tablet Commonly known as:  VESICARE Take 5 mg by mouth daily.   tiZANidine 2 MG tablet Commonly known as:  ZANAFLEX Take 2 mg by mouth daily.   Vitamin D (Ergocalciferol) 50000 units Caps capsule Commonly known as:  DRISDOL Take 50,000 Units by mouth every 7 (seven) days. Takes on Fridays.   warfarin 5 MG tablet Commonly known as:  COUMADIN TAKE 1 TABLET BY MOUTH ONCE DAILY.   zolpidem 10 MG tablet Commonly known as:  AMBIEN Take 10 mg by mouth at bedtime.       Discharged Condition:  improved    Consults: Cardiology  Significant Diagnostic Studies: Dg Chest 2 View  Result Date: 02/13/2016 CLINICAL DATA:  Confusion and difficulty walking.  Coughing. EXAM: CHEST  2 VIEW COMPARISON:  May 25, 2011 FINDINGS: The study is limited due to patient positioning. However, taking this into account, the heart, hila, mediastinum, lungs, and pleura are unremarkable. A nodule projected over lower thoracic vertebral body on the lateral view is unchanged since 2013 of no significance. IMPRESSION: No active cardiopulmonary disease. Electronically Signed   By: Dorise Bullion III M.D   On: 02/13/2016 11:06   Ct Head Wo Contrast  Result Date: 02/13/2016 CLINICAL DATA:  Difficulty walking since last night. Left-sided hemiplegia. EXAM: CT HEAD WITHOUT CONTRAST TECHNIQUE: Contiguous axial images were obtained from the base of the skull through the vertex without intravenous contrast. COMPARISON:  December 10, 2014 FINDINGS: Brain: No subdural, epidural, or subarachnoid hemorrhage. Ventricles and sulci are stable. Cerebellum, brainstem, and basal cisterns are normal. White matter changes and a few scattered lacunar infarcts are stable. No acute cortical ischemia or infarct. No mass, mass effect, or midline shift. Vascular: No hyperdense vessel or unexpected calcification. Skull: Normal. Negative for fracture or focal lesion. Sinuses/Orbits: Previous sinus surgery. Mastoid air cells and middle ears are unchanged unremarkable. Other: None. IMPRESSION: 1. No acute intracranial process to explain the patient's symptoms. 2. Chronic white matter changes. Electronically Signed   By: Dorise Bullion III M.D   On: 02/13/2016 11:27    Lab Results: Basic Metabolic Panel:  Recent Labs  02/13/16 0946  NA 133*  K 3.5  CL 98*  CO2 23  GLUCOSE 196*  BUN 15  CREATININE 0.97  CALCIUM 9.1   Liver Function Tests:  Recent Labs  02/13/16 0946  AST 28  ALT 16  ALKPHOS 71  BILITOT 0.6  PROT 7.6  ALBUMIN  3.9     CBC:  Recent Labs  02/13/16 0946  02/15/16 0540 02/16/16 0616  WBC 11.1*  < > 3.7* 8.3  NEUTROABS 9.0*  --   --   --   HGB 14.2  < > 13.5 14.8  HCT 41.8  < > 39.6 42.4  MCV 91.3  < > 92.3 90.0  PLT 226  < > 168 210  < > = values in this interval not displayed.  Recent Results (from the past 240 hour(s))  MRSA PCR Screening     Status: None   Collection Time: 02/13/16  4:30 PM  Result Value Ref Range Status   MRSA by PCR NEGATIVE NEGATIVE Final    Comment:        The GeneXpert MRSA Assay (FDA approved for NASAL specimens only), is one component of a comprehensive MRSA colonization surveillance program. It is not intended to diagnose MRSA infection nor to guide or  monitor treatment for MRSA infections.      Hospital Course:   This is a 71 years old female with history of multiple medical illness was admitted from [facility] assisted living due to lower extremities weakness and dizziness. Her troponin was elevated, but there was no EKG changes or chest pain. Her troponin improved gradually. Patient was seen by cardiology and Echo was done. Echo was found to be normal and there was no further recommendations. Patient remained stable and improved. She was discharged in stable condition.  Discharge Exam: Blood pressure 130/69, pulse 96, temperature 98.2 F (36.8 C), temperature source Oral, resp. rate 18, height 5\' 4"  (1.626 m), weight 76.7 kg (169 lb), SpO2 96 %.    Disposition:  Assisted living    Follow-up [place], MD Follow up in 1 week(s).   Specialty:  Internal Medicine Contact information: [address] [place] [address] [phone]           Signed: [name]   02/16/2016, 8:26 AM

## 2016-02-16 NOTE — Care Management (Addendum)
CM notified Dr. Legrand Rams via telephone, aware of need for [name]. [place] ordered placed will need to be co-signed and face to face ordered. Vaughan Basta of [facility] will obtain orders from chart. Patient will return to [facility] with initiation of home health services in 48 hours.

## 2016-02-17 ENCOUNTER — Telehealth: Payer: Self-pay | Admitting: Cardiology

## 2016-02-17 NOTE — Telephone Encounter (Signed)
Please call RX care in regards to directions for patient's coumdin   [phone]

## 2016-02-17 NOTE — Telephone Encounter (Signed)
Pharmacist from [place] called questioning coumadin dose of 5mg  daily at hospital discharge yesterday.  Reviewed chart.  D/C INR was 2.1  INR's have been therapeutic on previous coumadin dose.  Told pharmacy to continue coumadin 5mg  daily except 2.5mg  on Mondays, Wednesdays and Fridays.

## 2016-02-18 DIAGNOSIS — M1991 Primary osteoarthritis, unspecified site: Secondary | ICD-10-CM | POA: Diagnosis not present

## 2016-02-18 DIAGNOSIS — I10 Essential (primary) hypertension: Secondary | ICD-10-CM | POA: Diagnosis not present

## 2016-02-18 DIAGNOSIS — F209 Schizophrenia, unspecified: Secondary | ICD-10-CM | POA: Diagnosis not present

## 2016-02-18 DIAGNOSIS — Z794 Long term (current) use of insulin: Secondary | ICD-10-CM | POA: Diagnosis not present

## 2016-02-18 DIAGNOSIS — F329 Major depressive disorder, single episode, unspecified: Secondary | ICD-10-CM | POA: Diagnosis not present

## 2016-02-18 DIAGNOSIS — Z7901 Long term (current) use of anticoagulants: Secondary | ICD-10-CM | POA: Diagnosis not present

## 2016-02-18 DIAGNOSIS — R531 Weakness: Secondary | ICD-10-CM | POA: Diagnosis not present

## 2016-02-18 DIAGNOSIS — Z86718 Personal history of other venous thrombosis and embolism: Secondary | ICD-10-CM | POA: Diagnosis not present

## 2016-02-18 DIAGNOSIS — I69354 Hemiplegia and hemiparesis following cerebral infarction affecting left non-dominant side: Secondary | ICD-10-CM | POA: Diagnosis not present

## 2016-02-18 DIAGNOSIS — E1151 Type 2 diabetes mellitus with diabetic peripheral angiopathy without gangrene: Secondary | ICD-10-CM | POA: Diagnosis not present

## 2016-02-21 DIAGNOSIS — M1991 Primary osteoarthritis, unspecified site: Secondary | ICD-10-CM | POA: Diagnosis not present

## 2016-02-21 DIAGNOSIS — I825Y9 Chronic embolism and thrombosis of unspecified deep veins of unspecified proximal lower extremity: Secondary | ICD-10-CM | POA: Diagnosis not present

## 2016-02-21 DIAGNOSIS — F329 Major depressive disorder, single episode, unspecified: Secondary | ICD-10-CM | POA: Diagnosis not present

## 2016-02-21 DIAGNOSIS — R42 Dizziness and giddiness: Secondary | ICD-10-CM | POA: Diagnosis not present

## 2016-02-21 DIAGNOSIS — I10 Essential (primary) hypertension: Secondary | ICD-10-CM | POA: Diagnosis not present

## 2016-02-21 DIAGNOSIS — E1165 Type 2 diabetes mellitus with hyperglycemia: Secondary | ICD-10-CM | POA: Diagnosis not present

## 2016-02-21 DIAGNOSIS — G8190 Hemiplegia, unspecified affecting unspecified side: Secondary | ICD-10-CM | POA: Diagnosis not present

## 2016-02-21 DIAGNOSIS — I69354 Hemiplegia and hemiparesis following cerebral infarction affecting left non-dominant side: Secondary | ICD-10-CM | POA: Diagnosis not present

## 2016-02-21 DIAGNOSIS — R531 Weakness: Secondary | ICD-10-CM | POA: Diagnosis not present

## 2016-02-21 DIAGNOSIS — K589 Irritable bowel syndrome without diarrhea: Secondary | ICD-10-CM | POA: Diagnosis not present

## 2016-02-21 DIAGNOSIS — E1151 Type 2 diabetes mellitus with diabetic peripheral angiopathy without gangrene: Secondary | ICD-10-CM | POA: Diagnosis not present

## 2016-02-22 ENCOUNTER — Encounter: Payer: Self-pay | Admitting: Gastroenterology

## 2016-02-24 ENCOUNTER — Other Ambulatory Visit: Payer: Self-pay | Admitting: "Endocrinology

## 2016-02-24 DIAGNOSIS — I10 Essential (primary) hypertension: Secondary | ICD-10-CM | POA: Diagnosis not present

## 2016-02-24 DIAGNOSIS — F329 Major depressive disorder, single episode, unspecified: Secondary | ICD-10-CM | POA: Diagnosis not present

## 2016-02-24 DIAGNOSIS — R531 Weakness: Secondary | ICD-10-CM | POA: Diagnosis not present

## 2016-02-24 DIAGNOSIS — M1991 Primary osteoarthritis, unspecified site: Secondary | ICD-10-CM | POA: Diagnosis not present

## 2016-02-24 DIAGNOSIS — I69354 Hemiplegia and hemiparesis following cerebral infarction affecting left non-dominant side: Secondary | ICD-10-CM | POA: Diagnosis not present

## 2016-02-24 DIAGNOSIS — E1159 Type 2 diabetes mellitus with other circulatory complications: Secondary | ICD-10-CM | POA: Diagnosis not present

## 2016-02-24 DIAGNOSIS — E1151 Type 2 diabetes mellitus with diabetic peripheral angiopathy without gangrene: Secondary | ICD-10-CM | POA: Diagnosis not present

## 2016-02-25 DIAGNOSIS — R531 Weakness: Secondary | ICD-10-CM | POA: Diagnosis not present

## 2016-02-25 DIAGNOSIS — F329 Major depressive disorder, single episode, unspecified: Secondary | ICD-10-CM | POA: Diagnosis not present

## 2016-02-25 DIAGNOSIS — I10 Essential (primary) hypertension: Secondary | ICD-10-CM | POA: Diagnosis not present

## 2016-02-25 DIAGNOSIS — E1151 Type 2 diabetes mellitus with diabetic peripheral angiopathy without gangrene: Secondary | ICD-10-CM | POA: Diagnosis not present

## 2016-02-25 DIAGNOSIS — M1991 Primary osteoarthritis, unspecified site: Secondary | ICD-10-CM | POA: Diagnosis not present

## 2016-02-25 DIAGNOSIS — I69354 Hemiplegia and hemiparesis following cerebral infarction affecting left non-dominant side: Secondary | ICD-10-CM | POA: Diagnosis not present

## 2016-02-25 LAB — COMPREHENSIVE METABOLIC PANEL
ALT: 14 IU/L (ref 0–32)
AST: 13 IU/L (ref 0–40)
Albumin/Globulin Ratio: 1.4 (ref 1.2–2.2)
Albumin: 4.2 g/dL (ref 3.5–4.8)
Alkaline Phosphatase: 87 IU/L (ref 39–117)
BUN/Creatinine Ratio: 14 (ref 12–28)
BUN: 10 mg/dL (ref 8–27)
Bilirubin Total: 0.3 mg/dL (ref 0.0–1.2)
CO2: 26 mmol/L (ref 18–29)
Calcium: 9.2 mg/dL (ref 8.7–10.3)
Chloride: 100 mmol/L (ref 96–106)
Creatinine, Ser: 0.71 mg/dL (ref 0.57–1.00)
GFR calc Af Amer: 100 mL/min/{1.73_m2} (ref >59)
GFR calc non Af Amer: 87 mL/min/{1.73_m2} (ref >59)
Globulin, Total: 3 g/dL (ref 1.5–4.5)
Glucose: 132 mg/dL — ABNORMAL HIGH (ref 65–99)
Potassium: 4.3 mmol/L (ref 3.5–5.2)
Sodium: 143 mmol/L (ref 134–144)
Total Protein: 7.2 g/dL (ref 6.0–8.5)

## 2016-02-25 LAB — AMBIG ABBREV CMP14 DEFAULT

## 2016-02-25 LAB — HGB A1C W/O EAG: Hgb A1c MFr Bld: 6.9 % — ABNORMAL HIGH (ref 4.8–5.6)

## 2016-02-28 ENCOUNTER — Ambulatory Visit (INDEPENDENT_AMBULATORY_CARE_PROVIDER_SITE_OTHER): Payer: Medicare Other | Admitting: *Deleted

## 2016-02-28 DIAGNOSIS — Z8679 Personal history of other diseases of the circulatory system: Secondary | ICD-10-CM

## 2016-02-28 DIAGNOSIS — Z5181 Encounter for therapeutic drug level monitoring: Secondary | ICD-10-CM | POA: Diagnosis not present

## 2016-02-28 DIAGNOSIS — I82409 Acute embolism and thrombosis of unspecified deep veins of unspecified lower extremity: Secondary | ICD-10-CM

## 2016-02-28 DIAGNOSIS — M1991 Primary osteoarthritis, unspecified site: Secondary | ICD-10-CM | POA: Diagnosis not present

## 2016-02-28 DIAGNOSIS — E1151 Type 2 diabetes mellitus with diabetic peripheral angiopathy without gangrene: Secondary | ICD-10-CM | POA: Diagnosis not present

## 2016-02-28 DIAGNOSIS — Z7901 Long term (current) use of anticoagulants: Secondary | ICD-10-CM | POA: Diagnosis not present

## 2016-02-28 DIAGNOSIS — R531 Weakness: Secondary | ICD-10-CM | POA: Diagnosis not present

## 2016-02-28 DIAGNOSIS — I10 Essential (primary) hypertension: Secondary | ICD-10-CM | POA: Diagnosis not present

## 2016-02-28 DIAGNOSIS — I69354 Hemiplegia and hemiparesis following cerebral infarction affecting left non-dominant side: Secondary | ICD-10-CM | POA: Diagnosis not present

## 2016-02-28 DIAGNOSIS — F329 Major depressive disorder, single episode, unspecified: Secondary | ICD-10-CM | POA: Diagnosis not present

## 2016-02-28 LAB — POCT INR: INR: 2.7

## 2016-03-01 DIAGNOSIS — I69354 Hemiplegia and hemiparesis following cerebral infarction affecting left non-dominant side: Secondary | ICD-10-CM | POA: Diagnosis not present

## 2016-03-01 DIAGNOSIS — I10 Essential (primary) hypertension: Secondary | ICD-10-CM | POA: Diagnosis not present

## 2016-03-01 DIAGNOSIS — M1991 Primary osteoarthritis, unspecified site: Secondary | ICD-10-CM | POA: Diagnosis not present

## 2016-03-01 DIAGNOSIS — F329 Major depressive disorder, single episode, unspecified: Secondary | ICD-10-CM | POA: Diagnosis not present

## 2016-03-01 DIAGNOSIS — E1151 Type 2 diabetes mellitus with diabetic peripheral angiopathy without gangrene: Secondary | ICD-10-CM | POA: Diagnosis not present

## 2016-03-01 DIAGNOSIS — R531 Weakness: Secondary | ICD-10-CM | POA: Diagnosis not present

## 2016-03-02 ENCOUNTER — Ambulatory Visit (INDEPENDENT_AMBULATORY_CARE_PROVIDER_SITE_OTHER): Payer: Medicare Other | Admitting: "Endocrinology

## 2016-03-02 ENCOUNTER — Encounter: Payer: Self-pay | Admitting: "Endocrinology

## 2016-03-02 VITALS — BP 96/55 | HR 100 | Ht 67.0 in | Wt 164.0 lb

## 2016-03-02 DIAGNOSIS — E1159 Type 2 diabetes mellitus with other circulatory complications: Secondary | ICD-10-CM

## 2016-03-02 DIAGNOSIS — I10 Essential (primary) hypertension: Secondary | ICD-10-CM

## 2016-03-02 DIAGNOSIS — E782 Mixed hyperlipidemia: Secondary | ICD-10-CM

## 2016-03-02 MED ORDER — INSULIN ASPART 100 UNIT/ML FLEXPEN: PEN_INJECTOR | SUBCUTANEOUS | 2 refills | Status: DC

## 2016-03-02 MED ORDER — INSULIN DETEMIR 100 UNIT/ML FLEXPEN: PEN_INJECTOR | SUBCUTANEOUS | 2 refills | Status: DC

## 2016-03-02 NOTE — Progress Notes (Signed)
Subjective:    Patient ID: [name], female    DOB: January 19, 1946,    Past Medical History:  Diagnosis Date  . Anxiety   . Arthritis   . Depression    History of psychosis and previous suicide attempt  . DVT, lower extremity, recurrent (HCC)    Long-term Coumadin per Dr. Legrand Rams  . Essential hypertension   . GERD (gastroesophageal reflux disease)   . Hemiplegia ([place]) 2010   Left side  . History of stroke    Acute infarct and right cerebral white matter small vessel disease 12/10  . Leg DVT (deep venous thromboembolism), acute ([place]) 2006  . Schizophrenia ([place])   . Stroke ([place])   . Type 2 diabetes mellitus ([place])    Past Surgical History:  Procedure Laterality Date  . BACK SURGERY    . BIOPSY N/A 11/24/2014   Procedure: BIOPSY;  Surgeon: Danie Binder, MD;  Location: [facility];  Service: Endoscopy;  Laterality: N/A;  . COLONOSCOPY WITH PROPOFOL N/A 11/24/2014   Dr. Rudie Meyer polyps removed/moderate sized internal hemorrhoids, tubular adenomas. Next surveillance in 3 years  . ESOPHAGOGASTRODUODENOSCOPY (EGD) WITH PROPOFOL N/A 11/24/2014   Dr. Clayburn Pert HH/patent stricture at the gastroesophageal junction, mild non-erosive gastritis, path negative for H.pylori or celiac sprue  . GIVENS CAPSULE STUDY N/A 12/11/2014   MULTILPLE EROSION IN the stomach WITH ACTIVE OOZING. OCCASIONAL EROSIONS AND RARE ULCER SEEN IN PROXIMAL SMALL BOWEL . No masses or AVMs SEEN. NO OLD BLOOD OR FRESH BLOOD SEEN.   . POLYPECTOMY N/A 11/24/2014   Procedure: POLYPECTOMY;  Surgeon: Danie Binder, MD;  Location: [facility];  Service: Endoscopy;  Laterality: N/A;   Social History   Social History  . Marital status: Widowed    Spouse name: N/A  . Number of children: N/A  . Years of education: N/A   Social History Main Topics  . Smoking status: Former Smoker    Quit date: 01/24/1995  . Smokeless tobacco: Never Used  . Alcohol use No  . Drug use: No  . Sexual activity: No   Other Topics Concern  .  None   Social History Narrative  . None   Outpatient Encounter Prescriptions as of 03/02/2016  Medication Sig  . acetaminophen (TYLENOL) 325 MG tablet Take 650 mg by mouth every 6 (six) hours as needed. For fever > 101   . ARIPiprazole (ABILIFY) 2 MG tablet   . calcium carbonate (TUMS - DOSED IN MG ELEMENTAL CALCIUM) 500 MG chewable tablet Chew 1 tablet by mouth 4 (four) times daily as needed for indigestion or heartburn.   . clonazePAM (KLONOPIN) 0.5 MG tablet Take 0.5 mg by mouth 3 (three) times daily.   . DULoxetine (CYMBALTA) 60 MG capsule Take 120 mg by mouth daily.   . enalapril (VASOTEC) 2.5 MG tablet   . ferrous sulfate (EQL SLOW RELEASE IRON) 160 (50 Fe) MG TBCR SR tablet Take 1 tablet (160 mg total) by mouth daily.  Marland Kitchen HYDROcodone-acetaminophen (NORCO) 5-325 MG per tablet Take 1 tablet by mouth 3 (three) times daily.   . insulin aspart (NOVOLOG FLEXPEN) 100 UNIT/ML FlexPen INJECT 25 UNITS SUBCUTANEOUSLY BEFORE MEALS IF BS IS BETWEEN 90-150. 150-200 ADD 1 UNIT , 201-250 ADD 2 UNITS  . Insulin Detemir (LEVEMIR FLEXTOUCH) 100 UNIT/ML Pen INJECT 80 UNITS SUBCUTANEOUSLY AT BEDTIME.  Marland Kitchen latanoprost (XALATAN) 0.005 % ophthalmic solution Place 1 drop into both eyes at bedtime.  Marland Kitchen LINZESS 290 MCG CAPS capsule TAKE 1 CAPSULE BY MOUTH  EACH MORNING [place].  Marland Kitchen loperamide (IMODIUM) 2 MG capsule Take 4 mg by mouth daily as needed for diarrhea or loose stools. Take 2 capsules initially then take 2 after each loose stool per MAR  . metoprolol succinate (TOPROL-XL) 25 MG 24 hr tablet Take 12.5 mg by mouth 2 (two) times daily.   . mirtazapine (REMERON) 30 MG tablet Take 30 mg by mouth at bedtime.    Marland Kitchen NOVOLOG FLEXPEN 100 UNIT/ML FlexPen SS: 251-300 ADD 3 UNITS; 301-350 ADD 4 UNITS; 351-400 ADD 5 UNITS; 401-450 ADD 6 UNITS AND CALL DOCTOR.  Marland Kitchen omeprazole (PRILOSEC) 40 MG capsule Take 40 mg by mouth daily.  . polyvinyl alcohol (ARTIFICIAL TEARS) 1.4 % ophthalmic solution Place 1 drop  into both eyes 3 (three) times daily.  . risperiDONE (RISPERDAL) 2 MG tablet Take 2 mg by mouth 2 (two) times daily.  . simvastatin (ZOCOR) 20 MG tablet Take 20 mg by mouth at bedtime.  . solifenacin (VESICARE) 5 MG tablet Take 5 mg by mouth daily.  Marland Kitchen tiZANidine (ZANAFLEX) 2 MG tablet Take 2 mg by mouth daily.  . Vitamin D, Ergocalciferol, (DRISDOL) 50000 UNITS CAPS capsule Take 50,000 Units by mouth every 7 (seven) days. Takes on Fridays.  Marland Kitchen warfarin (COUMADIN) 5 MG tablet TAKE 1 TABLET BY MOUTH ONCE DAILY.  Marland Kitchen zolpidem (AMBIEN) 10 MG tablet Take 10 mg by mouth at bedtime.  . [DISCONTINUED] canagliflozin (INVOKANA) 100 MG TABS tablet Take 100 mg by mouth daily.  . [DISCONTINUED] docusate sodium (COLACE) 100 MG capsule Take 1 capsule (100 mg total) by mouth daily as needed for mild constipation. IF LINZESS DOES NOT FACILITATE A BM ON ANY GIVEN DAY. (Patient not taking: Reported on 02/13/2016)  . [DISCONTINUED] LEVEMIR FLEXTOUCH 100 UNIT/ML Pen INJECT 100 UNITS SUBCUTANEOUSLY AT BEDTIME.  . [DISCONTINUED] NOVOLOG FLEXPEN 100 UNIT/ML FlexPen INJECT 35 UNITS SUBCUTANEOUSLY BEFORE MEALS IF BS IS BETWEEN 90-150. 150-200 ADD 1 UNIT , 201-250 ADD 2 UNITS   No facility-administered encounter medications on file as of 03/02/2016.    ALLERGIES: Allergies  Allergen Reactions  . Sulfa Antibiotics Rash  . Sulfonamide Derivatives Rash    REACTION: rash   VACCINATION STATUS:  There is no immunization history on file for this patient.  Diabetes  She presents for her follow-up diabetic visit. She has type 2 diabetes mellitus. Onset time: She was diagnosed at approximate age of 41 years. Her disease course has been improving. There are no hypoglycemic associated symptoms. Pertinent negatives for hypoglycemia include no confusion, pallor or seizures. Pertinent negatives for diabetes include no polydipsia, no polyphagia and no polyuria. There are no hypoglycemic complications. Symptoms are improving. Diabetic  complications include a CVA. Risk factors for coronary artery disease include dyslipidemia, diabetes mellitus, obesity, sedentary lifestyle and hypertension. Current diabetic treatment includes intensive insulin program and oral agent (monotherapy) (She kept requiring large dose of insulin due to consumption of large quantities of processed carbohydrates.Marland Kitchen). Her weight is increasing steadily. She is following a generally unhealthy diet. When asked about meal planning, she reported none. She never participates in exercise. Her home blood glucose trend is decreasing steadily. Her breakfast blood glucose range is generally 140-180 mg/dl. Her lunch blood glucose range is generally 140-180 mg/dl. Her dinner blood glucose range is generally 140-180 mg/dl. Her overall blood glucose range is 140-180 mg/dl. An ACE inhibitor/angiotensin II receptor blocker is being taken.  Hypertension  This is a chronic problem. The current episode started more than 1 year ago. The  problem is controlled. Pertinent negatives include no palpitations or shortness of breath. Risk factors for coronary artery disease include dyslipidemia and diabetes mellitus. Hypertensive end-organ damage includes CVA.  Hyperlipidemia  This is a chronic problem. The current episode started more than 1 year ago. Exacerbating diseases include diabetes. Pertinent negatives include no shortness of breath. Risk factors for coronary artery disease include diabetes mellitus, dyslipidemia, hypertension, obesity and a sedentary lifestyle.     Review of Systems  Constitutional: Negative for unexpected weight change.       Uses walker to get around.   HENT: Negative for trouble swallowing and voice change.   Eyes: Negative for visual disturbance.  Respiratory: Negative for shortness of breath and wheezing.   Cardiovascular: Negative for palpitations and leg swelling.  Gastrointestinal: Negative for diarrhea.  Endocrine: Negative for cold intolerance, heat  intolerance, polydipsia, polyphagia and polyuria.  Skin: Negative for color change, pallor and wound.  Neurological: Negative for seizures.  Psychiatric/Behavioral: Negative for confusion and suicidal ideas.    Objective:    BP (!) 96/55   Pulse 100   Ht 5\' 7"  (1.702 m)   Wt 164 lb (74.4 kg)   BMI 25.69 kg/m   Wt Readings from Last 3 Encounters:  03/02/16 164 lb (74.4 kg)  02/13/16 169 lb (76.7 kg)  11/30/15 170 lb (77.1 kg)    Physical Exam  Constitutional: She is oriented to person, place, and time. She appears well-developed.  HENT:  Head: Normocephalic and atraumatic.  Eyes: EOM are normal.  Neck: Normal range of motion. Neck supple. No tracheal deviation present. No thyromegaly present.  Cardiovascular: Normal rate and regular rhythm.   Pulmonary/Chest: Effort normal and breath sounds normal.  Abdominal: Soft. Bowel sounds are normal. There is no tenderness. There is no guarding.  Musculoskeletal: Normal range of motion. She exhibits no edema.  Neurological: She is alert and oriented to person, place, and time. She has normal reflexes.  She uses walker to get around.  Skin: Skin is warm and dry. No rash noted. No erythema. No pallor.  Psychiatric: She has a normal mood and affect. Judgment normal.    Results for orders placed or performed in visit on 02/28/16  POCT INR  Result Value Ref Range   INR 2.7    Complete Blood Count (Most recent): Lab Results  Component Value Date   WBC 8.3 02/16/2016   HGB 14.8 02/16/2016   HCT 42.4 02/16/2016   MCV 90.0 02/16/2016   PLT 210 02/16/2016   Chemistry (most recent): Lab Results  Component Value Date   NA 143 02/24/2016   K 4.3 02/24/2016   CL 100 02/24/2016   CO2 26 02/24/2016   BUN 10 02/24/2016   CREATININE 0.71 02/24/2016   Diabetic Labs (most recent): Lab Results  Component Value Date   HGBA1C 6.9 (H) 02/24/2016   HGBA1C 7.0 (H) 11/17/2015   HGBA1C 7.6 (H) 08/09/2015   Lipid Panel     Component Value  Date/Time   CHOL (H) 01/03/2009 0630    210        ATP III CLASSIFICATION:  <200     mg/dL   Desirable  200-239  mg/dL   Borderline High  >=240    mg/dL   High          TRIG 161 (H) 01/03/2009 0630   HDL 35 (L) 01/03/2009 0630   CHOLHDL 6.0 01/03/2009 0630   VLDL 32 01/03/2009 0630   LDLCALC (H) 01/03/2009 0630  143        Total Cholesterol/HDL:CHD Risk Coronary Heart Disease Risk Table                     Men   Women  1/2 Average Risk   3.4   3.3  Average Risk       5.0   4.4  2 X Average Risk   9.6   7.1  3 X Average Risk  23.4   11.0        Use the calculated Patient Ratio above and the CHD Risk Table to determine the patient's CHD Risk.        ATP III CLASSIFICATION (LDL):  <100     mg/dL   Optimal  100-129  mg/dL   Near or Above                    Optimal  130-159  mg/dL   Borderline  160-189  mg/dL   High  >190     mg/dL   Very High      Assessment & Plan:   1. Type 2 diabetes mellitus with vascular disease ([place]) Her diabetes is  complicated by recurrent CVA. Patient came with improved glucose profile, and  recent A1c  Has improved to 6.9% from 8.2%.   Recent labs reviewed. - Patient remains at a high risk for more acute and chronic complications of diabetes which include CAD, CVA, CKD, retinopathy, and neuropathy. These are all discussed in detail with the patient.  - I have re-counseled the patient on diet management and weight loss  by adopting a carbohydrate restricted / protein rich  Diet. - Patient is advised to stick to a routine mealtimes to eat 3 meals  a day and avoid unnecessary snacks ( to snack only to correct hypoglycemia).  - Suggestion is made for patient to avoid simple carbohydrates   from their diet including Cakes , Desserts, Ice Cream,  Soda (  diet and regular) , Sweet Tea , Candies,  Chips, Cookies, Artificial Sweeteners,   and "Sugar-free" Products .  This will help patient to have stable blood glucose profile and potentially avoid  unintended  Weight gain.   - I have approached patient with the following individualized plan to manage diabetes and patient agrees.  - Lower Levemir to 80 units qhs,  Lower  Novolog to 25 units [facility] for premeal BG readings of 90-150mg /dl, plus patient specific sliding scale insulin for correction of unexpected hyperglycemia above 150mg /dl, associated with strict monitoring of BG AC and HS.   -Adjustment parameters for hypo and hyperglycemia were given in a written document to patient. -Patient is encouraged to call clinic for blood glucose levels less than 70 or above 300 mg /dl.  -I will discontinue Invokana . she did not tolerate Metformin. - Patient specific target  for A1c; LDL, HDL, Triglycerides, and  Waist Circumference were discussed in detail.  2) BP/HTN: Controlled. Continue current medications including ACEI. She is only on very low-dose metoprolol and very low-dose lisinopril. If she becomes symptomatic of hypotension, one of these medications should be stopped.  3) Lipids/HPL: continue statins. 4)  Weight/Diet:  exercise, and carbohydrates information provided.  5) Chronic Care/Health Maintenance:  -Patient is  on ACEI and Statin medications and encouraged to continue to follow up with Ophthalmology, Podiatrist at least yearly or according to recommendations, and advised to  stay away from smoking. I have recommended yearly flu vaccine and pneumonia vaccination  at least every 5 years; and  sleep for at least 7 hours a day.  I advised patient to maintain close follow up with their PCP for primary care needs.  Patient is asked to bring meter and  blood glucose logs during their next visit.   Follow up plan: Return in about 3 months (around 05/30/2016) for follow up with pre-visit labs, meter, and logs.  Glade Lloyd, MD Phone: [phone]  Fax: [phone]   03/02/2016, 4:33 PM

## 2016-03-03 DIAGNOSIS — E1151 Type 2 diabetes mellitus with diabetic peripheral angiopathy without gangrene: Secondary | ICD-10-CM | POA: Diagnosis not present

## 2016-03-03 DIAGNOSIS — I10 Essential (primary) hypertension: Secondary | ICD-10-CM | POA: Diagnosis not present

## 2016-03-03 DIAGNOSIS — R531 Weakness: Secondary | ICD-10-CM | POA: Diagnosis not present

## 2016-03-03 DIAGNOSIS — M1991 Primary osteoarthritis, unspecified site: Secondary | ICD-10-CM | POA: Diagnosis not present

## 2016-03-03 DIAGNOSIS — F329 Major depressive disorder, single episode, unspecified: Secondary | ICD-10-CM | POA: Diagnosis not present

## 2016-03-03 DIAGNOSIS — I69354 Hemiplegia and hemiparesis following cerebral infarction affecting left non-dominant side: Secondary | ICD-10-CM | POA: Diagnosis not present

## 2016-03-06 DIAGNOSIS — I10 Essential (primary) hypertension: Secondary | ICD-10-CM | POA: Diagnosis not present

## 2016-03-06 DIAGNOSIS — R531 Weakness: Secondary | ICD-10-CM | POA: Diagnosis not present

## 2016-03-06 DIAGNOSIS — E1151 Type 2 diabetes mellitus with diabetic peripheral angiopathy without gangrene: Secondary | ICD-10-CM | POA: Diagnosis not present

## 2016-03-06 DIAGNOSIS — F329 Major depressive disorder, single episode, unspecified: Secondary | ICD-10-CM | POA: Diagnosis not present

## 2016-03-06 DIAGNOSIS — I69354 Hemiplegia and hemiparesis following cerebral infarction affecting left non-dominant side: Secondary | ICD-10-CM | POA: Diagnosis not present

## 2016-03-06 DIAGNOSIS — M1991 Primary osteoarthritis, unspecified site: Secondary | ICD-10-CM | POA: Diagnosis not present

## 2016-03-08 ENCOUNTER — Other Ambulatory Visit: Payer: Self-pay | Admitting: Gastroenterology

## 2016-03-09 DIAGNOSIS — M1991 Primary osteoarthritis, unspecified site: Secondary | ICD-10-CM | POA: Diagnosis not present

## 2016-03-09 DIAGNOSIS — I10 Essential (primary) hypertension: Secondary | ICD-10-CM | POA: Diagnosis not present

## 2016-03-09 DIAGNOSIS — F329 Major depressive disorder, single episode, unspecified: Secondary | ICD-10-CM | POA: Diagnosis not present

## 2016-03-09 DIAGNOSIS — E1151 Type 2 diabetes mellitus with diabetic peripheral angiopathy without gangrene: Secondary | ICD-10-CM | POA: Diagnosis not present

## 2016-03-09 DIAGNOSIS — R531 Weakness: Secondary | ICD-10-CM | POA: Diagnosis not present

## 2016-03-09 DIAGNOSIS — I69354 Hemiplegia and hemiparesis following cerebral infarction affecting left non-dominant side: Secondary | ICD-10-CM | POA: Diagnosis not present

## 2016-03-10 DIAGNOSIS — R531 Weakness: Secondary | ICD-10-CM | POA: Diagnosis not present

## 2016-03-11 ENCOUNTER — Emergency Department (HOSPITAL_COMMUNITY): Payer: Medicare Other

## 2016-03-11 ENCOUNTER — Emergency Department (HOSPITAL_COMMUNITY)
Admission: EM | Admit: 2016-03-11 | Discharge: 2016-03-11 | Disposition: A | Discharge status: Home / Self Care | Payer: Medicare Other | Attending: Emergency Medicine | Admitting: Emergency Medicine

## 2016-03-11 ENCOUNTER — Encounter (HOSPITAL_COMMUNITY): Payer: Self-pay | Admitting: Emergency Medicine

## 2016-03-11 DIAGNOSIS — R1032 Left lower quadrant pain: Secondary | ICD-10-CM | POA: Diagnosis not present

## 2016-03-11 DIAGNOSIS — Z79899 Other long term (current) drug therapy: Secondary | ICD-10-CM | POA: Diagnosis not present

## 2016-03-11 DIAGNOSIS — K5900 Constipation, unspecified: Secondary | ICD-10-CM | POA: Diagnosis not present

## 2016-03-11 DIAGNOSIS — E119 Type 2 diabetes mellitus without complications: Secondary | ICD-10-CM | POA: Diagnosis not present

## 2016-03-11 DIAGNOSIS — I10 Essential (primary) hypertension: Secondary | ICD-10-CM | POA: Insufficient documentation

## 2016-03-11 DIAGNOSIS — Z794 Long term (current) use of insulin: Secondary | ICD-10-CM | POA: Insufficient documentation

## 2016-03-11 DIAGNOSIS — Z87891 Personal history of nicotine dependence: Secondary | ICD-10-CM | POA: Diagnosis not present

## 2016-03-11 DIAGNOSIS — Z7901 Long term (current) use of anticoagulants: Secondary | ICD-10-CM | POA: Insufficient documentation

## 2016-03-11 DIAGNOSIS — R1030 Lower abdominal pain, unspecified: Secondary | ICD-10-CM | POA: Diagnosis present

## 2016-03-11 DIAGNOSIS — R109 Unspecified abdominal pain: Secondary | ICD-10-CM

## 2016-03-11 DIAGNOSIS — R1031 Right lower quadrant pain: Secondary | ICD-10-CM | POA: Diagnosis not present

## 2016-03-11 LAB — URINALYSIS, ROUTINE W REFLEX MICROSCOPIC
Bilirubin Urine: NEGATIVE
Glucose, UA: NEGATIVE mg/dL
Hgb urine dipstick: NEGATIVE
Ketones, ur: NEGATIVE mg/dL
Nitrite: NEGATIVE
Protein, ur: NEGATIVE mg/dL
RBC / HPF: NONE SEEN RBC/hpf (ref 0–5)
Specific Gravity, Urine: 1.003 — ABNORMAL LOW (ref 1.005–1.030)
pH: 6 (ref 5.0–8.0)

## 2016-03-11 LAB — COMPREHENSIVE METABOLIC PANEL
ALT: 14 U/L (ref 14–54)
AST: 17 U/L (ref 15–41)
Albumin: 3.9 g/dL (ref 3.5–5.0)
Alkaline Phosphatase: 85 U/L (ref 38–126)
Anion gap: 9 (ref 5–15)
BUN: 12 mg/dL (ref 6–20)
CO2: 30 mmol/L (ref 22–32)
Calcium: 9.1 mg/dL (ref 8.9–10.3)
Chloride: 100 mmol/L — ABNORMAL LOW (ref 101–111)
Creatinine, Ser: 0.59 mg/dL (ref 0.44–1.00)
GFR calc Af Amer: >60 mL/min (ref >60)
GFR calc non Af Amer: >60 mL/min (ref >60)
Glucose, Bld: 121 mg/dL — ABNORMAL HIGH (ref 65–99)
Potassium: 3.7 mmol/L (ref 3.5–5.1)
Sodium: 139 mmol/L (ref 135–145)
Total Bilirubin: 0.2 mg/dL — ABNORMAL LOW (ref 0.3–1.2)
Total Protein: 7.3 g/dL (ref 6.5–8.1)

## 2016-03-11 LAB — CBC
HCT: 40.5 % (ref 36.0–46.0)
Hemoglobin: 13.8 g/dL (ref 12.0–15.0)
MCH: 30.9 pg (ref 26.0–34.0)
MCHC: 34.1 g/dL (ref 30.0–36.0)
MCV: 90.8 fL (ref 78.0–100.0)
Platelets: 236 10*3/uL (ref 150–400)
RBC: 4.46 MIL/uL (ref 3.87–5.11)
RDW: 12.7 % (ref 11.5–15.5)
WBC: 8 10*3/uL (ref 4.0–10.5)

## 2016-03-11 LAB — LIPASE, BLOOD: Lipase: <10 U/L — ABNORMAL LOW (ref 11–51)

## 2016-03-11 MED ORDER — IOPAMIDOL (ISOVUE-300) INJECTION 61%
INTRAVENOUS | Status: AC
  Administered 2016-03-11: 100 mL via INTRAVENOUS
  Filled 2016-03-11: qty 30

## 2016-03-11 MED ORDER — DICYCLOMINE HCL 10 MG/ML IM SOLN
10.0000 mg | Freq: Once | INTRAMUSCULAR | Status: AC
  Administered 2016-03-11: 10 mg via INTRAMUSCULAR
  Filled 2016-03-11: qty 2

## 2016-03-11 MED ORDER — METOCLOPRAMIDE HCL 5 MG PO TABS: 5.0000 mg | ORAL_TABLET | Freq: Three times a day (TID) | ORAL | 0 refills | Status: DC | PRN

## 2016-03-11 MED ORDER — IOPAMIDOL (ISOVUE-300) INJECTION 61%
INTRAVENOUS | Status: AC
  Administered 2016-03-11: 30 mL via ORAL
  Filled 2016-03-11: qty 100

## 2016-03-11 NOTE — ED Provider Notes (Signed)
[facility] DEPT Provider Note   CSN: [id number] Arrival date & time: 03/11/16  1531     History   Chief Complaint Chief Complaint  Patient presents with  . Abdominal Pain    HPI [name] is a 71 y.o. female.  HPI Pt was seen at [facility].  Per pt, c/o gradual onset and persistence of constant lower abd "pain" for the past 2 weeks. Has been associated with no other symptoms. Describes the abd pain as "bloating," "cramping" and "it feels nervous." Pt took a laxative with resultant 2 large BM's without change in her symptoms.   Denies N/V, no diarrhea, no fevers, no back pain, no rash, no CP/SOB, no black or blood in stools.      Past Medical History:  Diagnosis Date  . Anxiety   . Arthritis   . Depression    History of psychosis and previous suicide attempt  . DVT, lower extremity, recurrent (HCC)    Long-term Coumadin per Dr. Legrand Rams  . Essential hypertension   . GERD (gastroesophageal reflux disease)   . Hemiplegia ([place]) 2010   Left side  . History of stroke    Acute infarct and right cerebral white matter small vessel disease 12/10  . Leg DVT (deep venous thromboembolism), acute ([place]) 2006  . Schizophrenia ([place])   . Stroke ([place])   . Type 2 diabetes mellitus [facility])     Patient Active Problem List   Diagnosis Date Noted  . Elevated troponin 02/13/2016  . IDA (iron deficiency anemia) 04/06/2015  . Hyperlipidemia 11/06/2014  . Obesity due to excess calories 11/06/2014  . Sedentary lifestyle 11/06/2014  . Anemia 10/21/2014  . Rectal bleeding 10/21/2014  . Encounter for therapeutic drug monitoring 03/24/2013  . Chronic anticoagulation 04/09/2010  . Type 2 diabetes mellitus with vascular disease ([place]) 03/24/2010  . Essential hypertension, benign 03/24/2010  . DVT, lower extremity, recurrent ([place]) 03/24/2010  . History of cardiovascular disorder 03/24/2010    Past Surgical History:  Procedure Laterality Date  . BACK SURGERY    . BIOPSY N/A 11/24/2014   Procedure: BIOPSY;  Surgeon: Danie Binder, MD;  Location: [facility] ORS;  Service: Endoscopy;  Laterality: N/A;  . COLONOSCOPY WITH PROPOFOL N/A 11/24/2014   Dr. Rudie Meyer polyps removed/moderate sized internal hemorrhoids, tubular adenomas. Next surveillance in 3 years  . ESOPHAGOGASTRODUODENOSCOPY (EGD) WITH PROPOFOL N/A 11/24/2014   Dr. Clayburn Pert HH/patent stricture at the gastroesophageal junction, mild non-erosive gastritis, path negative for H.pylori or celiac sprue  . GIVENS CAPSULE STUDY N/A 12/11/2014   MULTILPLE EROSION IN the stomach WITH ACTIVE OOZING. OCCASIONAL EROSIONS AND RARE ULCER SEEN IN PROXIMAL SMALL BOWEL . No masses or AVMs SEEN. NO OLD BLOOD OR FRESH BLOOD SEEN.   . POLYPECTOMY N/A 11/24/2014   Procedure: POLYPECTOMY;  Surgeon: Danie Binder, MD;  Location: [facility];  Service: Endoscopy;  Laterality: N/A;    OB History    No data available       Home Medications    Prior to Admission medications   Medication Sig Start Date End Date Taking? Authorizing Provider  acetaminophen (TYLENOL) 325 MG tablet Take 650 mg by mouth every 6 (six) hours as needed. For fever > 101     Historical Provider, MD  ARIPiprazole (ABILIFY) 2 MG tablet  08/31/15   Historical Provider, MD  calcium carbonate (TUMS - DOSED IN MG ELEMENTAL CALCIUM) 500 MG chewable tablet Chew 1 tablet by mouth 4 (four) times daily as needed for indigestion or heartburn.  Historical Provider, MD  clonazePAM (KLONOPIN) 0.5 MG tablet Take 0.5 mg by mouth 3 (three) times daily.  10/08/14   Historical Provider, MD  DULoxetine (CYMBALTA) 60 MG capsule Take 120 mg by mouth daily.     Historical Provider, MD  enalapril (VASOTEC) 2.5 MG tablet  08/31/15   Historical Provider, MD  ferrous sulfate (EQL SLOW RELEASE IRON) 160 (50 Fe) MG TBCR SR tablet Take 1 tablet (160 mg total) by mouth daily. 01/07/16   Carlis Stable, NP  HYDROcodone-acetaminophen (NORCO) 5-325 MG per tablet Take 1 tablet by mouth 3 (three) times daily.      Historical Provider, MD  insulin aspart (NOVOLOG FLEXPEN) 100 UNIT/ML FlexPen INJECT 25 UNITS SUBCUTANEOUSLY BEFORE MEALS IF BS IS BETWEEN 90-150. 150-200 ADD 1 UNIT , 201-250 ADD 2 UNITS 03/02/16   Cassandria Anger, MD  Insulin Detemir (LEVEMIR FLEXTOUCH) 100 UNIT/ML Pen INJECT 80 UNITS SUBCUTANEOUSLY AT BEDTIME. 03/02/16   Cassandria Anger, MD  latanoprost (XALATAN) 0.005 % ophthalmic solution Place 1 drop into both eyes at bedtime.    Historical Provider, MD  LINZESS 290 MCG CAPS capsule TAKE 1 CAPSULE BY MOUTH EACH MORNING 30 MINUTES BEFORE BREAKFAST. 03/09/16   Carlis Stable, NP  loperamide (IMODIUM) 2 MG capsule Take 4 mg by mouth daily as needed for diarrhea or loose stools. Take 2 capsules initially then take 2 after each loose stool per [facility]    Historical Provider, MD  metoprolol succinate (TOPROL-XL) 25 MG 24 hr tablet Take 12.5 mg by mouth 2 (two) times daily.     Historical Provider, MD  mirtazapine (REMERON) 30 MG tablet Take 30 mg by mouth at bedtime.      Historical Provider, MD  NOVOLOG FLEXPEN 100 UNIT/ML FlexPen SS: 251-300 ADD 3 UNITS; 301-350 ADD 4 UNITS; 351-400 ADD 5 UNITS; 401-450 ADD 6 UNITS AND CALL DOCTOR. 12/06/15   Cassandria Anger, MD  omeprazole (PRILOSEC) 40 MG capsule Take 40 mg by mouth daily.    Historical Provider, MD  polyvinyl alcohol (ARTIFICIAL TEARS) 1.4 % ophthalmic solution Place 1 drop into both eyes 3 (three) times daily.    Historical Provider, MD  risperiDONE (RISPERDAL) 2 MG tablet Take 2 mg by mouth 2 (two) times daily.    Historical Provider, MD  simvastatin (ZOCOR) 20 MG tablet Take 20 mg by mouth at bedtime.    Historical Provider, MD  solifenacin (VESICARE) 5 MG tablet Take 5 mg by mouth daily.    Historical Provider, MD  tiZANidine (ZANAFLEX) 2 MG tablet Take 2 mg by mouth daily.    Historical Provider, MD  Vitamin D, Ergocalciferol, (DRISDOL) 50000 UNITS CAPS capsule Take 50,000 Units by mouth every 7 (seven) days. Takes on Fridays.     Historical Provider, MD  warfarin (COUMADIN) 5 MG tablet TAKE 1 TABLET BY MOUTH ONCE DAILY. 09/28/15   Arnoldo Lenis, MD  zolpidem (AMBIEN) 10 MG tablet Take 10 mg by mouth at bedtime.    Historical Provider, MD    Family History Family History  Problem Relation Age of Onset  . Hypertension Mother   . Colon cancer Neg Hx     Social History Social History  Substance Use Topics  . Smoking status: Former Smoker    Quit date: 01/24/1995  . Smokeless tobacco: Never Used  . Alcohol use No     Allergies   Sulfa antibiotics and Sulfonamide derivatives   Review of Systems Review of Systems ROS: Statement: All systems negative except  as marked or noted in the HPI; Constitutional: Negative for fever and chills. ; ; Eyes: Negative for eye pain, redness and discharge. ; ; ENMT: Negative for ear pain, hoarseness, nasal congestion, sinus pressure and sore throat. ; ; Cardiovascular: Negative for chest pain, palpitations, diaphoresis, dyspnea and peripheral edema. ; ; Respiratory: Negative for cough, wheezing and stridor. ; ; Gastrointestinal: +abd pain. Negative for nausea, vomiting, diarrhea, blood in stool, hematemesis, jaundice and rectal bleeding. . ; ; Genitourinary: Negative for dysuria, flank pain and hematuria. ; ; Musculoskeletal: Negative for back pain and neck pain. Negative for swelling and trauma.; ; Skin: Negative for pruritus, rash, abrasions, blisters, bruising and skin lesion.; ; Neuro: Negative for headache, lightheadedness and neck stiffness. Negative for weakness, altered level of consciousness, altered mental status, extremity weakness, paresthesias, involuntary movement, seizure and syncope.       Physical Exam Updated Vital Signs BP 114/61   Pulse 82   Temp 97.5 F (36.4 C) (Oral)   Resp 17   SpO2 96%   Physical Exam 1925: Physical examination:  Nursing notes reviewed; Vital signs and O2 SAT reviewed;  Constitutional: Well developed, Well nourished, Well hydrated,  In no acute distress; Head:  Normocephalic, atraumatic; Eyes: EOMI, PERRL, No scleral icterus; ENMT: Mouth and pharynx normal, Mucous membranes moist; Neck: Supple, Full range of motion, No lymphadenopathy; Cardiovascular: Regular rate and rhythm, No gallop; Respiratory: Breath sounds clear & equal bilaterally, No wheezes.  Speaking full sentences with ease, Normal respiratory effort/excursion; Chest: Nontender, Movement normal; Abdomen: Soft, +mild LLQ, suprapubic, RLQ tenderness to palp. No rebound or guarding. Nondistended, Normal bowel sounds; Genitourinary: No CVA tenderness; Extremities: Pulses normal, No tenderness, No edema, No calf edema or asymmetry.; Neuro: AA&Ox3, Major CN grossly intact.  Speech clear. No gross focal motor or sensory deficits in extremities.; Skin: Color normal, Warm, Dry.; Psych:  Anxious.    ED Treatments / Results  Labs (all labs ordered are listed, but only abnormal results are displayed)   EKG  EKG Interpretation None       Radiology   Procedures Procedures (including critical care time)  Medications Ordered in ED Medications  dicyclomine (BENTYL) injection 10 mg (not administered)     Initial Impression / Assessment and Plan / ED Course  I have reviewed the triage vital signs and the nursing notes.  Pertinent labs & imaging results that were available during my care of the patient were reviewed by me and considered in my medical decision making (see chart for details).  MDM Reviewed: previous chart, nursing note and vitals Reviewed previous: labs Interpretation: labs and CT scan   Results for orders placed or performed during the hospital encounter of 03/11/16  Lipase, blood  Result Value Ref Range   Lipase <10 (L) 11 - 51 U/L  Comprehensive metabolic panel  Result Value Ref Range   Sodium 139 135 - 145 mmol/L   Potassium 3.7 3.5 - 5.1 mmol/L   Chloride 100 (L) 101 - 111 mmol/L   CO2 30 22 - 32 mmol/L   Glucose, Bld 121 (H) 65 - 99  mg/dL   BUN 12 6 - 20 mg/dL   Creatinine, Ser 0.59 0.44 - 1.00 mg/dL   Calcium 9.1 8.9 - 10.3 mg/dL   Total Protein 7.3 6.5 - 8.1 g/dL   Albumin 3.9 3.5 - 5.0 g/dL   AST 17 15 - 41 U/L   ALT 14 14 - 54 U/L   Alkaline Phosphatase 85 38 - 126 U/L  Total Bilirubin 0.2 (L) 0.3 - 1.2 mg/dL   GFR calc non Af Amer >60 >60 mL/min   GFR calc Af Amer >60 >60 mL/min   Anion gap 9 5 - 15  CBC  Result Value Ref Range   WBC 8.0 4.0 - 10.5 K/uL   RBC 4.46 3.87 - 5.11 MIL/uL   Hemoglobin 13.8 12.0 - 15.0 g/dL   HCT 40.5 36.0 - 46.0 %   MCV 90.8 78.0 - 100.0 fL   MCH 30.9 26.0 - 34.0 pg   MCHC 34.1 30.0 - 36.0 g/dL   RDW 12.7 11.5 - 15.5 %   Platelets 236 150 - 400 K/uL  Urinalysis, Routine w reflex microscopic  Result Value Ref Range   Color, Urine STRAW (A) YELLOW   APPearance CLEAR CLEAR   Specific Gravity, Urine 1.003 (L) 1.005 - 1.030   pH 6.0 5.0 - 8.0   Glucose, UA NEGATIVE NEGATIVE mg/dL   Hgb urine dipstick NEGATIVE NEGATIVE   Bilirubin Urine NEGATIVE NEGATIVE   Ketones, ur NEGATIVE NEGATIVE mg/dL   Protein, ur NEGATIVE NEGATIVE mg/dL   Nitrite NEGATIVE NEGATIVE   Leukocytes, UA TRACE (A) NEGATIVE   RBC / HPF NONE SEEN 0 - 5 RBC/hpf   WBC, UA 0-5 0 - 5 WBC/hpf   Bacteria, UA RARE (A) NONE SEEN    Ct Abdomen Pelvis W Contrast Result Date: 03/11/2016 CLINICAL DATA:  71 year old female with history of abdominal pain for the past 2 weeks. Constipation. Bloating. Loss of appetite. EXAM: CT ABDOMEN AND PELVIS WITH CONTRAST TECHNIQUE: Multidetector CT imaging of the abdomen and pelvis was performed using the standard protocol following bolus administration of intravenous contrast. CONTRAST:  47mL ISOVUE-300 IOPAMIDOL (ISOVUE-300) INJECTION 61%, 1 ISOVUE-300 IOPAMIDOL (ISOVUE-300) INJECTION 61% COMPARISON:  CT the abdomen and pelvis 09/14/2014. FINDINGS: Lower chest: Calcified granuloma in the right lower lobe. 3 mm noncalcified pulmonary nodule in the right lower lobe (image 10 of series  3) unchanged over numerous prior examinations dating back to at least February 23, 2010, considered benign. Atherosclerotic calcifications in the right coronary artery. Calcifications of the mitral annulus. Aortic atherosclerosis. Small hiatal hernia. Hepatobiliary: The previously noted cavernous hemangioma in the right lobe of the liver continues to change. On today's study there is a central area of vascularity associated with this lesion which is best demonstrated on delayed images where it measures up to 3.2 x 2.4 cm (axial image 4 of series 7). The lesion is now surrounded by a much larger area of low attenuation, which could reflect profound fatty infiltration related to altered perfusion, or could reflect surrounding areas of cystic change and/or necrosis. The overall appearance is strongly favored to be benign. No new hepatic lesions are noted. No intra or extrahepatic biliary ductal dilatation. Small amount of high attenuation material lying dependently in the gallbladder may reflect tiny gallstones and/or biliary sludge. Gallbladder is moderately distended, but there is no gallbladder wall thickening or surrounding inflammatory changes to suggest an acute cholecystitis at this time. Pancreas: No pancreatic mass. No pancreatic ductal dilatation. No pancreatic or peripancreatic fluid or inflammatory changes. Spleen: Unremarkable. Adrenals/Urinary Tract: Bilateral kidneys and bilateral adrenal glands are normal in appearance. There is no hydroureteronephrosis. Urinary bladder is normal in appearance. Stomach/Bowel: The intraabdominal portion of the stomach is normal. No pathologic dilatation of small bowel or colon. Normal appendix. Vascular/Lymphatic: Aortic atherosclerosis, without evidence of aneurysm or dissection in the abdominal or pelvic vasculature. Left-sided iliac veins are diminutive in the pelvis, but there are  numerous large collateral vessels. Mildly enlarged hepatoduodenal ligament lymph node  measuring 13 mm in short axis (image 25 of series 2). Mildly enlarged portacaval lymph node measuring 14 mm in short axis. Reproductive: Uterus and right ovary are unremarkable in appearance. In the left ovary there is a 4.3 x 3.9 x 3.1 cm low-attenuation lesion which is likely to represent a cyst. Other: No significant volume of ascites.  No pneumoperitoneum. Musculoskeletal: 9 mm of anterolisthesis of L4 upon L5. Chronic compression fractures of T10, T11 and T12 with 15% loss of anterior vertebral body height at each level. There are no aggressive appearing lytic or blastic lesions noted in the visualized portions of the skeleton. IMPRESSION: 1. No acute findings in the abdomen or pelvis to account for the patient's symptoms. 2. Large unusual appearing lesion in the right lobe of the liver. This was previously characterized as a cavernous hemangioma, and the appearance on today's examination is favored to reflect evolution of a cavernous hemangioma with developing surrounding severe hepatic steatosis related to regionally altered perfusion. However, further evaluation of this lesion is recommended with nonemergent MRI of the abdomen with and without IV gadolinium in the near future to provide more definitive characterization, particularly in light of the mildly enlarged portacaval and hepatoduodenal ligament lymph nodes. 3. Small amount of biliary sludge and/or tiny gallstones lying dependently in the gallbladder. No findings to suggest an acute cholecystitis at this time. 4. 4.3 x 3.9 x 3.1 cm cyst in the left ovary. This is unusual in a postmenopausal female. Further evaluation with nonemergent transvaginal ultrasound is recommended in the near future to definitively characterize this lesion. This recommendation follows ACR consensus guidelines: White Paper of the ACR Incidental Findings Committee II on Adnexal Findings. J Am Coll Radiol 2013:10:675-681. 5. Aortic atherosclerosis, in addition to least right  coronary artery disease. Assessment for potential risk factor modification, dietary therapy or pharmacologic therapy may be warranted, if clinically indicated. 6. Additional incidental findings, as above. Electronically Signed   By: Vinnie Langton M.D.   On: 03/11/2016 21:10     2145:  INR 2.7 on 02/28/2016. CT without definitive cause for pt's abd pain; several incidental findings relayed to pt for f/u by PMD and OB/GYN MD. No clear UTI on Udip; UC pending. Workup otherwise reassuring. Pt has tol PO well without N/V. No stooling while in the ED. Abd benign, VSS. Tx symptomatically at this time. Dx and testing d/w pt and family.  Questions answered.  Verb understanding, agreeable to d/c home with outpt f/u.   Final Clinical Impressions(s) / ED Diagnoses   Final diagnoses:  None    New Prescriptions New Prescriptions   No medications on file     Francine Graven, DO 03/15/16 1611

## 2016-03-11 NOTE — ED Notes (Signed)
Staff arrived from [facility] and transported pt back to facility via [facility]

## 2016-03-11 NOTE — ED Notes (Signed)
Pt assisted to bedpan, tolerated well,  

## 2016-03-11 NOTE — ED Notes (Signed)
Called facility and gave report, Wells Guiles at [facility] states that they will be coming to get the patient.

## 2016-03-11 NOTE — ED Notes (Signed)
ED Provider at bedside. 

## 2016-03-11 NOTE — ED Triage Notes (Signed)
Patient c/o lower abd pain x10 days. Per patient recently seen here in ER for "nervous stomach." Patient reports constipation in which she took laxatives and had 2 large BMs today with no relief. Denies any nausea or vomiting. Per patient bloating and lack of appetite.

## 2016-03-11 NOTE — Discharge Instructions (Signed)
Take the prescription as directed.  Your CT scan showed an incidental finding(s):  "Large unusual appearing lesion in the right lobe of the liver. This was previously characterized as a cavernous hemangioma, and the appearance on today's examination is favored to reflect evolution of a cavernous hemangioma with developing surrounding severe hepatic steatosis related to regionally altered perfusion. However, further evaluation of this lesion is recommended with nonemergent MRI of the abdomen with and without IV gadolinium in the near future to provide more definitive characterization, particularly in light of the mildly enlarged portacaval and hepatoduodenal ligament lymph nodes;"  4.3 x 3.9 x 3.1 cm cyst in the left ovary. This is unusual in a postmenopausal female. Further evaluation with nonemergent transvaginal ultrasound is recommended in the near future to definitively characterize this lesion."   Call your regular medical doctor on Monday to schedule a follow up appointment within the next 3 days. Call the GI doctor and the OB/GYN doctor on Monday to schedule a follow up appointment within the next week.  Return to the Emergency Department immediately sooner if worsening.

## 2016-03-13 DIAGNOSIS — I10 Essential (primary) hypertension: Secondary | ICD-10-CM | POA: Diagnosis not present

## 2016-03-13 DIAGNOSIS — R531 Weakness: Secondary | ICD-10-CM | POA: Diagnosis not present

## 2016-03-13 DIAGNOSIS — M1991 Primary osteoarthritis, unspecified site: Secondary | ICD-10-CM | POA: Diagnosis not present

## 2016-03-13 DIAGNOSIS — I69354 Hemiplegia and hemiparesis following cerebral infarction affecting left non-dominant side: Secondary | ICD-10-CM | POA: Diagnosis not present

## 2016-03-13 DIAGNOSIS — E1151 Type 2 diabetes mellitus with diabetic peripheral angiopathy without gangrene: Secondary | ICD-10-CM | POA: Diagnosis not present

## 2016-03-13 DIAGNOSIS — F329 Major depressive disorder, single episode, unspecified: Secondary | ICD-10-CM | POA: Diagnosis not present

## 2016-03-13 LAB — URINE CULTURE: Culture: NO GROWTH

## 2016-03-16 ENCOUNTER — Ambulatory Visit: Payer: Medicare Other | Admitting: Gastroenterology

## 2016-03-16 DIAGNOSIS — E1151 Type 2 diabetes mellitus with diabetic peripheral angiopathy without gangrene: Secondary | ICD-10-CM | POA: Diagnosis not present

## 2016-03-16 DIAGNOSIS — I10 Essential (primary) hypertension: Secondary | ICD-10-CM | POA: Diagnosis not present

## 2016-03-16 DIAGNOSIS — R531 Weakness: Secondary | ICD-10-CM | POA: Diagnosis not present

## 2016-03-16 DIAGNOSIS — F329 Major depressive disorder, single episode, unspecified: Secondary | ICD-10-CM | POA: Diagnosis not present

## 2016-03-16 DIAGNOSIS — M1991 Primary osteoarthritis, unspecified site: Secondary | ICD-10-CM | POA: Diagnosis not present

## 2016-03-16 DIAGNOSIS — I69354 Hemiplegia and hemiparesis following cerebral infarction affecting left non-dominant side: Secondary | ICD-10-CM | POA: Diagnosis not present

## 2016-03-20 DIAGNOSIS — M79674 Pain in right toe(s): Secondary | ICD-10-CM | POA: Diagnosis not present

## 2016-03-20 DIAGNOSIS — E1165 Type 2 diabetes mellitus with hyperglycemia: Secondary | ICD-10-CM | POA: Diagnosis not present

## 2016-03-20 DIAGNOSIS — M79675 Pain in left toe(s): Secondary | ICD-10-CM | POA: Diagnosis not present

## 2016-03-20 DIAGNOSIS — R110 Nausea: Secondary | ICD-10-CM | POA: Diagnosis not present

## 2016-03-20 DIAGNOSIS — B351 Tinea unguium: Secondary | ICD-10-CM | POA: Diagnosis not present

## 2016-03-20 DIAGNOSIS — K219 Gastro-esophageal reflux disease without esophagitis: Secondary | ICD-10-CM | POA: Diagnosis not present

## 2016-03-22 ENCOUNTER — Encounter: Payer: Self-pay | Admitting: Gastroenterology

## 2016-03-22 ENCOUNTER — Ambulatory Visit (INDEPENDENT_AMBULATORY_CARE_PROVIDER_SITE_OTHER): Payer: Medicare Other | Admitting: Gastroenterology

## 2016-03-22 DIAGNOSIS — K7689 Other specified diseases of liver: Secondary | ICD-10-CM | POA: Diagnosis not present

## 2016-03-22 DIAGNOSIS — R1031 Right lower quadrant pain: Secondary | ICD-10-CM

## 2016-03-22 DIAGNOSIS — K769 Liver disease, unspecified: Secondary | ICD-10-CM | POA: Insufficient documentation

## 2016-03-22 DIAGNOSIS — D500 Iron deficiency anemia secondary to blood loss (chronic): Secondary | ICD-10-CM

## 2016-03-22 DIAGNOSIS — K625 Hemorrhage of anus and rectum: Secondary | ICD-10-CM

## 2016-03-22 DIAGNOSIS — R1032 Left lower quadrant pain: Secondary | ICD-10-CM

## 2016-03-22 DIAGNOSIS — R109 Unspecified abdominal pain: Secondary | ICD-10-CM | POA: Insufficient documentation

## 2016-03-22 MED ORDER — NALOXEGOL OXALATE 12.5 MG PO TABS: 12.5000 mg | ORAL_TABLET | Freq: Every day | ORAL | 11 refills | Status: DC

## 2016-03-22 MED ORDER — PROMETHAZINE HCL 12.5 MG PO TABS: 12.5000 mg | ORAL_TABLET | Freq: Four times a day (QID) | ORAL | 0 refills | Status: DC | PRN

## 2016-03-22 NOTE — Progress Notes (Signed)
Subjective:    Patient ID: [name], female    DOB: Nov 07, 1945, 71 y.o.   MRN: [id number]  [name],[name], MD  HPI TROUBLE MOVIGN HER BOWELS. HAD BM AFTER MIRALAX. MADE HER FEEL A LITTLE BETTER. NO NEW MEDS. NAUSEA: ALL THE TIME, "CAN'T EAT". KEEPS HER AWAKE AT NIGHT. FEELS SHE HAS A NERVOUS STOMACH. DENIES ANY STRESSORS. FEEL LIKE THSI STARTED A MO AGO. DOESN'T Ionia DR. KAPLAN IN [place]. STARTED AT [facility] SEP 2016-172 LBS. NO MORE STROKES. WON'T EAT. BURPS ALL THE TIME AND HAS GAS. SOB WHEN SHE GETS NERVOUS. LOWER ABDOMINAL PAIN(ACHY, ALL THE TIME WORSE AFTER BM, BETTER:??). NO DYSURIA OR HEMATURIA. A LITTLE PROBLEMS-GETS CHOKED ON SOLIDS ONCE A WEEK. QUITS CIGS: 20 YRS AGO. NO ASA/NSAIDS.   PT DENIES FEVER, CHILLS, HEMATOCHEZIA, HEMATEMESIS, vomiting, melena, diarrhea, CHEST PAIN, CHANGE IN BOWEL IN HABITS, OR heartburn or indigestion.  Past Medical History:  Diagnosis Date  . Anxiety   . Arthritis   . Depression    History of psychosis and previous suicide attempt  . DVT, lower extremity, recurrent (HCC)    Long-term Coumadin per Dr. Legrand Rams  . Essential hypertension   . GERD (gastroesophageal reflux disease)   . Hemiplegia ([place]) 2010   Left side  . History of stroke    Acute infarct and right cerebral white matter small vessel disease 12/10  . Leg DVT (deep venous thromboembolism), acute ([place]) 2006  . Schizophrenia ([place])   . Stroke ([place])   . Type 2 diabetes mellitus ([place])    Past Surgical History:  Procedure Laterality Date  . BACK SURGERY    . BIOPSY N/A 11/24/2014   Procedure: BIOPSY;  Surgeon: Danie Binder, MD;  Location: [facility];  Service: Endoscopy;  Laterality: N/A;  . COLONOSCOPY WITH PROPOFOL N/A 11/24/2014   Dr. Rudie Meyer polyps removed/moderate sized internal hemorrhoids, tubular adenomas. Next surveillance in 3 years  . ESOPHAGOGASTRODUODENOSCOPY (EGD) WITH PROPOFOL N/A 11/24/2014   Dr. Clayburn Pert HH/patent stricture at the gastroesophageal  junction, mild non-erosive gastritis, path negative for H.pylori or celiac sprue  . GIVENS CAPSULE STUDY N/A 12/11/2014   MULTILPLE EROSION IN the stomach WITH ACTIVE OOZING. OCCASIONAL EROSIONS AND RARE ULCER SEEN IN PROXIMAL SMALL BOWEL . No masses or AVMs SEEN. NO OLD BLOOD OR FRESH BLOOD SEEN.   . POLYPECTOMY N/A 11/24/2014   Procedure: POLYPECTOMY;  Surgeon: Danie Binder, MD;  Location: [facility];  Service: Endoscopy;  Laterality: N/A;   Allergies  Allergen Reactions  . Sulfa Antibiotics Rash  . Sulfonamide Derivatives Rash    REACTION: rash   Current Outpatient Prescriptions  Medication Sig Dispense Refill  . acetaminophen (TYLENOL) 325 MG tablet Take 650 mg by mouth every 6 (six) hours as needed. For fever > 101     . ARIPiprazole (ABILIFY) 2 MG tablet Take 2 mg by mouth daily.     . calcium carbonate (TUMS - DOSED IN MG ELEMENTAL CALCIUM) 500 MG chewable tablet Chew 1 tablet by mouth 4 (four) times daily as needed for indigestion or heartburn.     . canagliflozin (INVOKANA) 100 MG TABS tablet Take 100 mg by mouth daily.    . clonazePAM (KLONOPIN) 0.5 MG tablet Take 0.5 mg by mouth 3 (three) times daily.     . DULoxetine (CYMBALTA) 60 MG capsule Take 120 mg by mouth daily.     . enalapril (VASOTEC) 2.5 MG tablet Take 1.25 mg by mouth daily.     . ferrous sulfate (  EQL SLOW RELEASE IRON) 160 (50 Fe) MG TBCR SR tablet Take 1 tablet (160 mg total) by mouth daily.    Marland Kitchen HYDROcodone-acetaminophen (NORCO) 5-325 MG per tablet Take 1 tablet by mouth 3 (three) times daily.     . Insulin Detemir (LEVEMIR FLEXTOUCH) 100 UNIT/ML Pen INJECT 80 UNITS SUBCUTANEOUSLY AT BEDTIME. (Patient taking differently: Inject 100 Units into the skin at bedtime. )    . latanoprost (XALATAN) 0.005 % ophthalmic solution Place 1 drop into both eyes at bedtime.    Marland Kitchen LINZESS 290 MCG CAPS capsule TAKE 1 CAPSULE BY MOUTH EACH MORNING 30 MINUTES BEFORE BREAKFAST.    Marland Kitchen loperamide (IMODIUM) 2 MG capsule Take 4 mg by mouth  daily as needed for diarrhea or loose stools. Take 2 capsules initially then take 2 after each loose stool per MAR    . metoCLOPramide (REGLAN) 5 MG tablet Take 1 tablet (5 mg total) by mouth every 8 (eight) hours as needed for nausea or vomiting (or bloating).    . metoprolol succinate (TOPROL-XL) 25 MG 24 hr tablet Take 12.5 mg by mouth 2 (two) times daily.     . mirtazapine (REMERON) 30 MG tablet Take 30 mg by mouth at bedtime.      Marland Kitchen NOVOLOG FLEXPEN 100 UNIT/ML FlexPen SS: 251-300 ADD 3 UNITS; 301-350 ADD 4 UNITS; 351-400 ADD 5 UNITS; 401-450 ADD 6 UNITS AND CALL DOCTOR. (Patient taking differently: INJECT 25 UNITS SUBCUTANEOUSLY BEFORE MEALS. SLIDING SCALE AS FOLLOWS: 90-150= 0 150-200= 1 UNIT, 201-250= 2 UNITS, 251-300= 3 UNITS, 301-350= 4 UNITS, 351-400= 5 UNITS 401-450= 6 UNITS AND CALL DOCTOR)    . omeprazole (PRILOSEC) 40 MG capsule Take 40 mg by mouth daily.    . polyvinyl alcohol (ARTIFICIAL TEARS) 1.4 % ophthalmic solution Place 1 drop into both eyes 3 (three) times daily.    . risperiDONE (RISPERDAL) 2 MG tablet Take 2 mg by mouth 2 (two) times daily.    . simvastatin (ZOCOR) 20 MG tablet Take 20 mg by mouth at bedtime.    . solifenacin (VESICARE) 5 MG tablet Take 5 mg by mouth daily.    Marland Kitchen tiZANidine (ZANAFLEX) 2 MG tablet Take 2 mg by mouth daily.    . Vitamin D, Ergocalciferol, (DRISDOL) 50000 UNITS CAPS capsule Take 50,000 Units by mouth every 7 (seven) days. Takes on Fridays.    Marland Kitchen warfarin (COUMADIN) 5 MG tablet TAKE 1 TABLET BY MOUTH ONCE DAILY. (Patient taking differently: TAKE 2.5MG  BY MOUTH ON MONDAYS, WEDNESDAYS, AND FRIDAYS. TAKE 5MG  ON ALL OTHER DAYS)    . zolpidem (AMBIEN) 10 MG tablet Take 10 mg by mouth at bedtime.     Review of Systems PER HPI OTHERWISE ALL SYSTEMS ARE NEGATIVE.    Objective:   Physical Exam  Constitutional: She is oriented to person, place, and time. She appears well-developed and well-nourished. No distress.  HENT:  Head: Normocephalic and  atraumatic.  Mouth/Throat: Oropharynx is clear and moist. No oropharyngeal exudate.  Eyes: Pupils are equal, round, and reactive to light. No scleral icterus.  Neck: Normal range of motion. Neck supple.  Cardiovascular: Normal rate and regular rhythm.   Murmur heard. Pulmonary/Chest: Effort normal and breath sounds normal. No respiratory distress.  Abdominal: Soft. Bowel sounds are normal. She exhibits no distension. There is tenderness. There is no rebound and no guarding.  MILD BLQs TTP, left RENAL BRUIT  Musculoskeletal: She exhibits no edema.  Lymphadenopathy:    She has no cervical adenopathy.  Neurological: She is alert  and oriented to person, place, and time.  NO  NEW FOCAL DEFICITS  Psychiatric:  FLAT AFFECT, SLIGHTLY ANXIOUS MOOD  Vitals reviewed.     Assessment & Plan:

## 2016-03-22 NOTE — Progress Notes (Signed)
ON RECALL  °

## 2016-03-22 NOTE — Assessment & Plan Note (Signed)
I PERSONALLY REVIEWED THE CT FEB 2018 WITH DR. Thornton Papas. INDETERMINATE LESION IN RIGHT HEPATIC LOBE.  NEEDS MRI OF LIVER

## 2016-03-22 NOTE — Progress Notes (Signed)
cc'ed to pcp °

## 2016-03-22 NOTE — Assessment & Plan Note (Addendum)
ASSOCIATED WITH CHANGE IN STOOL HABITS. CONSTIPATION MORE DIFFICULT TO CONTROL BUT PT TAKING NORCO TID AND MULTIPLE ANTICHOLINERGIC AGENTS. I PERSONALLY REVIEWED THE CT FEB 2018 WITH DR. Arelia Longest ACUTE CHANGES OR EVIDENCE FOR MESENTERIC STENOSIS/ISCHEMIA.  COMPLETE UA. STOP IRON. RECHECK CBC/FERRITIN IN 3 MOS PHENERGAN IF NEEDED FOR NAUSEA. ADD MOVANTIK 12.5 MG DAILY. CALL IN 2 WEEKS IF PAIN IS NOT RESOLVED AND CONSTIPATION IS NOT IMPROVED. FOLLOW UP IN 3 MOS.

## 2016-03-22 NOTE — Assessment & Plan Note (Signed)
SYMPTOMS CONTROLLED/RESOLVED.  CONTINUE TO MONITOR SYMPTOMS. 

## 2016-03-22 NOTE — Assessment & Plan Note (Signed)
NL HB FEB 2018. NO BRBPR OR MELENA.  CONTINUE TO MONITOR SYMPTOMS.

## 2016-03-22 NOTE — Patient Instructions (Signed)
SEE LONG TERM CARE SHEET.

## 2016-03-27 ENCOUNTER — Ambulatory Visit (INDEPENDENT_AMBULATORY_CARE_PROVIDER_SITE_OTHER): Payer: Medicare Other | Admitting: *Deleted

## 2016-03-27 DIAGNOSIS — Z7901 Long term (current) use of anticoagulants: Secondary | ICD-10-CM | POA: Diagnosis not present

## 2016-03-27 DIAGNOSIS — Z8679 Personal history of other diseases of the circulatory system: Secondary | ICD-10-CM

## 2016-03-27 DIAGNOSIS — I82409 Acute embolism and thrombosis of unspecified deep veins of unspecified lower extremity: Secondary | ICD-10-CM

## 2016-03-27 DIAGNOSIS — Z5181 Encounter for therapeutic drug level monitoring: Secondary | ICD-10-CM

## 2016-03-27 LAB — POCT INR: INR: 3.2

## 2016-03-28 ENCOUNTER — Ambulatory Visit (INDEPENDENT_AMBULATORY_CARE_PROVIDER_SITE_OTHER): Payer: Medicare Other | Admitting: Obstetrics & Gynecology

## 2016-03-28 ENCOUNTER — Ambulatory Visit (HOSPITAL_COMMUNITY): Payer: Medicare Other

## 2016-03-28 ENCOUNTER — Encounter: Payer: Self-pay | Admitting: Obstetrics & Gynecology

## 2016-03-28 VITALS — BP 110/60 | HR 72 | Wt 160.0 lb

## 2016-03-28 DIAGNOSIS — N83202 Unspecified ovarian cyst, left side: Secondary | ICD-10-CM | POA: Diagnosis not present

## 2016-03-28 NOTE — Progress Notes (Signed)
Chief Complaint  Patient presents with  . Follow-up    ED/ seem abdominal pain/ room # 11    Blood pressure 110/60, pulse 72, weight 160 lb (72.6 kg).  71 y.o. No obstetric history on file. No LMP recorded. Patient is postmenopausal. The current method of family planning is none.  Outpatient Encounter Prescriptions as of 03/28/2016  Medication Sig Note  . acetaminophen (TYLENOL) 325 MG tablet Take 650 mg by mouth every 6 (six) hours as needed. For fever > 101    . ARIPiprazole (ABILIFY) 2 MG tablet Take 2 mg by mouth daily.  09/01/2015: Received from: External Pharmacy  . calcium carbonate (TUMS - DOSED IN MG ELEMENTAL CALCIUM) 500 MG chewable tablet Chew 1 tablet by mouth 4 (four) times daily as needed for indigestion or heartburn.    . clonazePAM (KLONOPIN) 0.5 MG tablet Take 0.5 mg by mouth 3 (three) times daily.  10/21/2014: Received from: External Pharmacy  . DULoxetine (CYMBALTA) 60 MG capsule Take 120 mg by mouth daily.    . enalapril (VASOTEC) 2.5 MG tablet Take 1.25 mg by mouth daily.  09/01/2015: Received from: External Pharmacy  . HYDROcodone-acetaminophen (NORCO) 5-325 MG per tablet Take 1 tablet by mouth 3 (three) times daily.    . Insulin Detemir (LEVEMIR FLEXTOUCH) 100 UNIT/ML Pen INJECT 80 UNITS SUBCUTANEOUSLY AT BEDTIME. (Patient taking differently: Inject 100 Units into the skin at bedtime. )   . latanoprost (XALATAN) 0.005 % ophthalmic solution Place 1 drop into both eyes at bedtime.   Marland Kitchen loperamide (IMODIUM) 2 MG capsule Take 4 mg by mouth daily as needed for diarrhea or loose stools. Take 2 capsules initially then take 2 after each loose stool per MAR   . metoCLOPramide (REGLAN) 5 MG tablet Take 1 tablet (5 mg total) by mouth every 8 (eight) hours as needed for nausea or vomiting (or bloating).   . metoprolol succinate (TOPROL-XL) 25 MG 24 hr tablet Take 12.5 mg by mouth 2 (two) times daily.    . mirtazapine (REMERON) 30 MG tablet Take 30 mg by mouth at bedtime.     .  naloxegol oxalate (MOVANTIK) 12.5 MG TABS tablet Take 1 tablet (12.5 mg total) by mouth daily.   Marland Kitchen omeprazole (PRILOSEC) 40 MG capsule Take 40 mg by mouth daily.   . polyvinyl alcohol (ARTIFICIAL TEARS) 1.4 % ophthalmic solution Place 1 drop into both eyes 3 (three) times daily.   . risperiDONE (RISPERDAL) 2 MG tablet Take 2 mg by mouth 2 (two) times daily.   . solifenacin (VESICARE) 5 MG tablet Take 5 mg by mouth daily.   Marland Kitchen tiZANidine (ZANAFLEX) 2 MG tablet Take 2 mg by mouth daily.   . Vitamin D, Ergocalciferol, (DRISDOL) 50000 UNITS CAPS capsule Take 50,000 Units by mouth every 7 (seven) days. Takes on Fridays.   Marland Kitchen zolpidem (AMBIEN) 10 MG tablet Take 10 mg by mouth at bedtime.   . [DISCONTINUED] canagliflozin (INVOKANA) 100 MG TABS tablet Take 100 mg by mouth daily.   . [DISCONTINUED] NOVOLOG FLEXPEN 100 UNIT/ML FlexPen SS: 251-300 ADD 3 UNITS; 301-350 ADD 4 UNITS; 351-400 ADD 5 UNITS; 401-450 ADD 6 UNITS AND CALL DOCTOR. (Patient taking differently: INJECT 25 UNITS SUBCUTANEOUSLY BEFORE MEALS. SLIDING SCALE AS FOLLOWS: 90-150= 0 150-200= 1 UNIT, 201-250= 2 UNITS, 251-300= 3 UNITS, 301-350= 4 UNITS, 351-400= 5 UNITS 401-450= 6 UNITS AND CALL DOCTOR)   . [DISCONTINUED] promethazine (PHENERGAN) 12.5 MG tablet Take 1 tablet (12.5 mg total) by mouth every  6 (six) hours as needed for nausea or vomiting.   . [DISCONTINUED] simvastatin (ZOCOR) 20 MG tablet Take 20 mg by mouth at bedtime.   . [DISCONTINUED] warfarin (COUMADIN) 5 MG tablet TAKE 1 TABLET BY MOUTH ONCE DAILY. (Patient taking differently: TAKE 2.5MG  BY MOUTH ON MONDAYS, WEDNESDAYS, AND FRIDAYS. TAKE 5MG  ON ALL OTHER DAYS)    No facility-administered encounter medications on file as of 03/28/2016.     Subjective Pt is seen as a follow up from the ED for 4.6 cm cyst left ovary She presented with lower abdominal pain, has already seen Dr Oneida Alar who she sees regularly I reviewed the sonogram dated 09/14/2016 and compared it to the CT scan  03/21/2016 and both reveal a stable 4.6-4.8 cm simple cyst of the ovary I also reviewed her CT scan from 02/23/2010 and there was no mention of the mass  The patient pretty much said she wanted medicine for her "nervous stomach" and I told her that would be up to Dr Oneida Alar  I discussed the scan reports in detail with the patient and told her they were stable and there was no evidence of ovarian or adnexal torsion  She was not happy I was unwilling to give her the medicine for her "nervous stomach"  I told her everything was stable and that a repeat sonogram in 1-2 years would be appropriate, she was unhappy with that assessment and said she "wouldn't be back"  Objective   Pertinent ROS   Labs or studies Reviewed multiple scans referecned above    Impression Diagnoses this Encounter::   ICD-9-CM ICD-10-CM   1. Ovarian cyst, left 620.2 N83.202    Simple, stable over a 2 year period, no evidence of change or torsion    Established relevant diagnosis(es):   Plan/Recommendations: No orders of the defined types were placed in this encounter.   Labs or Scans Ordered: No orders of the defined types were placed in this encounter.   Management:: Follow up sonogram to ensure stability in 1-2 years Pt is uninterested in a CA 125 level  Follow up Return if symptoms worsen or fail to improve.        Face to face time:  20 minutes  Greater than 50% of the visit time was spent in counseling and coordination of care with the patient.  The summary and outline of the counseling and care coordination is summarized in the note above.   All questions were answered.  Past Medical History:  Diagnosis Date  . Anxiety   . Arthritis   . Depression    History of psychosis and previous suicide attempt  . DVT, lower extremity, recurrent (HCC)    Long-term Coumadin per Dr. Legrand Rams  . Essential hypertension   . GERD (gastroesophageal reflux disease)   . Hemiplegia ([place]) 2010   Left  side  . History of stroke    Acute infarct and right cerebral white matter small vessel disease 12/10  . Leg DVT (deep venous thromboembolism), acute ([place]) 2006  . Schizophrenia ([place])   . Stroke ([place])   . Type 2 diabetes mellitus ([place])     Past Surgical History:  Procedure Laterality Date  . BACK SURGERY    . BIOPSY N/A 11/24/2014   Procedure: BIOPSY;  Surgeon: Danie Binder, MD;  Location: [facility];  Service: Endoscopy;  Laterality: N/A;  . COLONOSCOPY WITH PROPOFOL N/A 11/24/2014   Dr. Rudie Meyer polyps removed/moderate sized internal hemorrhoids, tubular adenomas. Next surveillance in 3 years  .  ESOPHAGOGASTRODUODENOSCOPY (EGD) WITH PROPOFOL N/A 11/24/2014   Dr. Clayburn Pert HH/patent stricture at the gastroesophageal junction, mild non-erosive gastritis, path negative for H.pylori or celiac sprue  . GIVENS CAPSULE STUDY N/A 12/11/2014   MULTILPLE EROSION IN the stomach WITH ACTIVE OOZING. OCCASIONAL EROSIONS AND RARE ULCER SEEN IN PROXIMAL SMALL BOWEL . No masses or AVMs SEEN. NO OLD BLOOD OR FRESH BLOOD SEEN.   . POLYPECTOMY N/A 11/24/2014   Procedure: POLYPECTOMY;  Surgeon: Danie Binder, MD;  Location: [facility];  Service: Endoscopy;  Laterality: N/A;    OB History    No data available      Allergies  Allergen Reactions  . Sulfa Antibiotics Rash  . Sulfonamide Derivatives Rash    REACTION: rash    Social History   Social History  . Marital status: Widowed    Spouse name: N/A  . Number of children: N/A  . Years of education: N/A   Social History Main Topics  . Smoking status: Former Smoker    Quit date: 01/24/1995  . Smokeless tobacco: Never Used  . Alcohol use No  . Drug use: No  . Sexual activity: No   Other Topics Concern  . None   Social History Narrative  . None    Family History  Problem Relation Age of Onset  . Hypertension Mother   . Colon cancer Neg Hx

## 2016-03-29 DIAGNOSIS — R1032 Left lower quadrant pain: Secondary | ICD-10-CM | POA: Diagnosis not present

## 2016-03-29 DIAGNOSIS — R1031 Right lower quadrant pain: Secondary | ICD-10-CM | POA: Diagnosis not present

## 2016-03-29 NOTE — Progress Notes (Signed)
Cardiology Office Note  Date: 03/30/2016   ID: [name], DOB 06-Aug-1945, MRN [id number]  PCP: [name], MD  Primary Cardiologist: Rozann Lesches, MD   Chief Complaint  Patient presents with  . Follow-up on Coumadin    History of Present Illness: [name] is a 71 y.o. female last seen in the office by Ms. Vita Barley in March 2017. She was seen in consultation in January of this year with mild elevation in troponin I not clearly suggestive of ACS, no chest pain, and a brief burst of SVT documented by telemetry. Follow-up echocardiogram is outlined below. She presents today from [facility] facility for follow-up, does not report any chest pain or palpitations, no syncope.  She follows in the anticoagulation clinic on Coumadin with prior history of DVT and stroke. Recent INR was 3.2. No obvious bleeding problems. Medications are outlined below. She continues on Toprol-XL 25 mg daily.  Past Medical History:  Diagnosis Date  . Anxiety   . Arthritis   . Depression    History of psychosis and previous suicide attempt  . DVT, lower extremity, recurrent (HCC)    Long-term Coumadin per Dr. Legrand Rams  . Essential hypertension   . GERD (gastroesophageal reflux disease)   . Hemiplegia ([place]) 2010   Left side  . History of stroke    Acute infarct and right cerebral white matter small vessel disease 12/10  . Leg DVT (deep venous thromboembolism), acute ([place]) 2006  . Schizophrenia ([place])   . Stroke ([place])   . Type 2 diabetes mellitus ([place])     Past Surgical History:  Procedure Laterality Date  . BACK SURGERY    . BIOPSY N/A 11/24/2014   Procedure: BIOPSY;  Surgeon: Danie Binder, MD;  Location: [facility];  Service: Endoscopy;  Laterality: N/A;  . COLONOSCOPY WITH PROPOFOL N/A 11/24/2014   Dr. Rudie Meyer polyps removed/moderate sized internal hemorrhoids, tubular adenomas. Next surveillance in 3 years  . ESOPHAGOGASTRODUODENOSCOPY (EGD) WITH PROPOFOL N/A 11/24/2014   Dr.  Clayburn Pert HH/patent stricture at the gastroesophageal junction, mild non-erosive gastritis, path negative for H.pylori or celiac sprue  . GIVENS CAPSULE STUDY N/A 12/11/2014   MULTILPLE EROSION IN the stomach WITH ACTIVE OOZING. OCCASIONAL EROSIONS AND RARE ULCER SEEN IN PROXIMAL SMALL BOWEL . No masses or AVMs SEEN. NO OLD BLOOD OR FRESH BLOOD SEEN.   . POLYPECTOMY N/A 11/24/2014   Procedure: POLYPECTOMY;  Surgeon: Danie Binder, MD;  Location: [facility];  Service: Endoscopy;  Laterality: N/A;    Current Outpatient Prescriptions  Medication Sig Dispense Refill  . acetaminophen (TYLENOL) 325 MG tablet Take 650 mg by mouth every 6 (six) hours as needed. For fever > 101     . ARIPiprazole (ABILIFY) 2 MG tablet Take 2 mg by mouth daily.     . calcium carbonate (TUMS - DOSED IN MG ELEMENTAL CALCIUM) 500 MG chewable tablet Chew 1 tablet by mouth 4 (four) times daily as needed for indigestion or heartburn.     . canagliflozin (INVOKANA) 100 MG TABS tablet Take 100 mg by mouth daily.    . clonazePAM (KLONOPIN) 0.5 MG tablet Take 0.5 mg by mouth 3 (three) times daily.     . DULoxetine (CYMBALTA) 60 MG capsule Take 120 mg by mouth daily.     . enalapril (VASOTEC) 2.5 MG tablet Take 1.25 mg by mouth daily.     Marland Kitchen HYDROcodone-acetaminophen (NORCO) 5-325 MG per tablet Take 1 tablet by mouth 3 (three) times daily.     Marland Kitchen  Insulin Detemir (LEVEMIR FLEXTOUCH) 100 UNIT/ML Pen INJECT 80 UNITS SUBCUTANEOUSLY AT BEDTIME. (Patient taking differently: Inject 100 Units into the skin at bedtime. ) 30 mL 2  . latanoprost (XALATAN) 0.005 % ophthalmic solution Place 1 drop into both eyes at bedtime.    Marland Kitchen loperamide (IMODIUM) 2 MG capsule Take 4 mg by mouth daily as needed for diarrhea or loose stools. Take 2 capsules initially then take 2 after each loose stool per MAR    . metoCLOPramide (REGLAN) 5 MG tablet Take 1 tablet (5 mg total) by mouth every 8 (eight) hours as needed for nausea or vomiting (or bloating). 5 tablet 0    . metoprolol succinate (TOPROL-XL) 25 MG 24 hr tablet Take 12.5 mg by mouth 2 (two) times daily.     . mirtazapine (REMERON) 30 MG tablet Take 30 mg by mouth at bedtime.      . naloxegol oxalate (MOVANTIK) 12.5 MG TABS tablet Take 1 tablet (12.5 mg total) by mouth daily. 30 tablet 11  . NOVOLOG FLEXPEN 100 UNIT/ML FlexPen SS: 251-300 ADD 3 UNITS; 301-350 ADD 4 UNITS; 351-400 ADD 5 UNITS; 401-450 ADD 6 UNITS AND CALL DOCTOR. (Patient taking differently: INJECT 25 UNITS SUBCUTANEOUSLY BEFORE MEALS. SLIDING SCALE AS FOLLOWS: 90-150= 0 150-200= 1 UNIT, 201-250= 2 UNITS, 251-300= 3 UNITS, 301-350= 4 UNITS, 351-400= 5 UNITS 401-450= 6 UNITS AND CALL DOCTOR) 15 mL 2  . omeprazole (PRILOSEC) 40 MG capsule Take 40 mg by mouth daily.    . polyvinyl alcohol (ARTIFICIAL TEARS) 1.4 % ophthalmic solution Place 1 drop into both eyes 3 (three) times daily.    . promethazine (PHENERGAN) 12.5 MG tablet Take 1 tablet (12.5 mg total) by mouth every 6 (six) hours as needed for nausea or vomiting. 30 tablet 0  . risperiDONE (RISPERDAL) 2 MG tablet Take 2 mg by mouth 2 (two) times daily.    . simvastatin (ZOCOR) 20 MG tablet Take 20 mg by mouth at bedtime.    . solifenacin (VESICARE) 5 MG tablet Take 5 mg by mouth daily.    Marland Kitchen tiZANidine (ZANAFLEX) 2 MG tablet Take 2 mg by mouth daily.    . Vitamin D, Ergocalciferol, (DRISDOL) 50000 UNITS CAPS capsule Take 50,000 Units by mouth every 7 (seven) days. Takes on Fridays.    Marland Kitchen warfarin (COUMADIN) 5 MG tablet TAKE 1 TABLET BY MOUTH ONCE DAILY. (Patient taking differently: TAKE 2.5MG  BY MOUTH ON MONDAYS, WEDNESDAYS, AND FRIDAYS. TAKE 5MG  ON ALL OTHER DAYS) 30 tablet 6  . zolpidem (AMBIEN) 10 MG tablet Take 10 mg by mouth at bedtime.     No current facility-administered medications for this visit.    Allergies:  Sulfa antibiotics and Sulfonamide derivatives   Social History: The patient  reports that she quit smoking about 21 years ago. She has never used smokeless tobacco.  She reports that she does not drink alcohol or use drugs.   ROS:  Please see the history of present illness. Otherwise, complete review of systems is positive for intermittent abdominal discomfort.  All other systems are reviewed and negative.   Physical Exam: VS:  BP (!) 98/44   Pulse 96   Ht 5\' 6"  (1.676 m)   Wt 162 lb (73.5 kg)   SpO2 96%   BMI 26.15 kg/m , BMI Body mass index is 26.15 kg/m.  Wt Readings from Last 3 Encounters:  03/30/16 162 lb (73.5 kg)  03/28/16 160 lb (72.6 kg)  03/22/16 163 lb 3.2 oz (74 kg)  Overweight woman, appears comfortable at rest. HEENT: Conjunctiva and lids normal, oropharynx clear. Neck: Supple, no elevated JVP or carotid bruits, no thyromegaly. Lungs: Clear to auscultation, nonlabored breathing at rest. Cardiac: Regular rate and rhythm, no S3, 2/6 systolic murmur at base, no pericardial rub. Abdomen: Soft, nontender, bowel sounds present. Extremities: 1+ leg edema left greater than right, distal pulses 2+.  ECG: I personally reviewed the tracing from 02/13/2016 which showed sinus tachycardia with nonspecific ST changes and borderline low voltage.  Recent Labwork: 02/13/2016: TSH 0.538 03/11/2016: ALT 14; AST 17; BUN 12; Creatinine, Ser 0.59; Hemoglobin 13.8; Platelets 236; Potassium 3.7; Sodium 139   Other Studies Reviewed Today:  Echocardiogram 02/15/2016: Study Conclusions  - Left ventricle: The cavity size was normal. Wall thickness was   increased in a pattern of moderate LVH. There was severe focal   basal hypertrophy of the septum. The estimated ejection fraction   was 55%. Wall motion was normal; there were no regional wall   motion abnormalities. Features are consistent with a pseudonormal   left ventricular filling pattern, with concomitant abnormal   relaxation and increased filling pressure (grade 2 diastolic   dysfunction). - Aortic valve: Moderately calcified annulus. Trileaflet; mildly   calcified leaflets. - Mitral  valve: Calcified annulus. There was trivial regurgitation. - Right atrium: Central venous pressure (est): 3 mm Hg. - Tricuspid valve: There was trivial regurgitation. - Pulmonary arteries: Systolic pressure could not be accurately   estimated. - Pericardium, extracardiac: There was no pericardial effusion.  Impressions:  - Moderate LVH with severe basal septal hypertrophy and LVEF 55%.   No focal wall motion abnormalities. Grade 2 diastolic   dysfunction. Moderately sclerotic aortic valve. Trivial mitral   and tricuspid regurgitation.  Assessment and Plan:  1. Patient following anticoagulation clinic on Coumadin with history of DVT and previous stroke. PCP is Dr. Legrand Rams.  2. SVT documented by telemetry monitoring January. She does not report any palpitations or syncope. Would continue Toprol-XL and observation for now. Echocardiogram shows LVEF 55%.  Current medicines were reviewed with the patient today.  Disposition: Follow-up in one year.  Signed, Satira Sark, MD, [facility] 03/30/2016 10:42 AM    [facility] [place]. [address], [place], [place] [address] Phone: [phone]; Fax: [phone]

## 2016-03-30 ENCOUNTER — Ambulatory Visit (INDEPENDENT_AMBULATORY_CARE_PROVIDER_SITE_OTHER): Payer: Medicare Other | Admitting: Cardiology

## 2016-03-30 ENCOUNTER — Encounter: Payer: Self-pay | Admitting: Cardiology

## 2016-03-30 VITALS — BP 98/44 | HR 96 | Ht 66.0 in | Wt 162.0 lb

## 2016-03-30 DIAGNOSIS — I471 Supraventricular tachycardia: Secondary | ICD-10-CM | POA: Diagnosis not present

## 2016-03-30 DIAGNOSIS — Z7901 Long term (current) use of anticoagulants: Secondary | ICD-10-CM | POA: Diagnosis not present

## 2016-03-30 DIAGNOSIS — Z8679 Personal history of other diseases of the circulatory system: Secondary | ICD-10-CM

## 2016-03-30 LAB — URINALYSIS, ROUTINE W REFLEX MICROSCOPIC
Bilirubin Urine: NEGATIVE
Glucose, UA: NEGATIVE
Hgb urine dipstick: NEGATIVE
Ketones, ur: NEGATIVE
Nitrite: NEGATIVE
Protein, ur: NEGATIVE
Specific Gravity, Urine: 1.015 (ref 1.001–1.035)
pH: 7 (ref 5.0–8.0)

## 2016-03-30 LAB — URINALYSIS, MICROSCOPIC ONLY
Bacteria, UA: NONE SEEN [HPF]
Casts: NONE SEEN [LPF]
Crystals: NONE SEEN [HPF]
RBC / HPF: NONE SEEN RBC/HPF (ref <=2)
Yeast: NONE SEEN [HPF]

## 2016-03-30 NOTE — Patient Instructions (Signed)
Your physician wants you to follow-up in: 1 year with Dr McDowell You will receive a reminder letter in the mail two months in advance. If you don't receive a letter, please call our office to schedule the follow-up appointment.    Your physician recommends that you continue on your current medications as directed. Please refer to the Current Medication list given to you today.     If you need a refill on your cardiac medications before your next appointment, please call your pharmacy.     Thank you for choosing [facility] !        

## 2016-03-31 ENCOUNTER — Ambulatory Visit (HOSPITAL_COMMUNITY)
Admission: RE | Admit: 2016-03-31 | Discharge: 2016-03-31 | Disposition: A | Discharge status: Home / Self Care | Payer: Medicare Other | Source: Ambulatory Visit | Attending: Gastroenterology | Admitting: Gastroenterology

## 2016-03-31 ENCOUNTER — Other Ambulatory Visit: Payer: Self-pay | Admitting: Cardiology

## 2016-03-31 DIAGNOSIS — D1809 Hemangioma of other sites: Secondary | ICD-10-CM | POA: Diagnosis not present

## 2016-03-31 DIAGNOSIS — K8020 Calculus of gallbladder without cholecystitis without obstruction: Secondary | ICD-10-CM | POA: Insufficient documentation

## 2016-03-31 DIAGNOSIS — N83202 Unspecified ovarian cyst, left side: Secondary | ICD-10-CM | POA: Insufficient documentation

## 2016-03-31 DIAGNOSIS — D1803 Hemangioma of intra-abdominal structures: Secondary | ICD-10-CM | POA: Diagnosis not present

## 2016-03-31 DIAGNOSIS — K769 Liver disease, unspecified: Secondary | ICD-10-CM

## 2016-03-31 DIAGNOSIS — M419 Scoliosis, unspecified: Secondary | ICD-10-CM | POA: Diagnosis not present

## 2016-03-31 MED ORDER — GADOBENATE DIMEGLUMINE 529 MG/ML IV SOLN
15.0000 mL | Freq: Once | INTRAVENOUS | Status: AC | PRN
  Administered 2016-03-31: 15 mL via INTRAVENOUS

## 2016-04-04 NOTE — Progress Notes (Signed)
[place], [place] [address]  Iron deficiency anemia due to chronic blood loss - Plan: Polysacchar Iron-FA-B12 (FERREX 150 FORTE) 150-1-25 MG-MG-MCG CAPS, CBC with Differential, Basic metabolic panel, Iron and TIBC, Ferritin  CURRENT THERAPY: Ferrous sulfate 160 mg PO daily discontinued by GI due to intolerance on 03/22/2016.  INTERVAL HISTORY: [name] 71 y.o. female returns for followup of iron deficiency anemia in the setting of chronic anticoagulation with Coumadin for recurrent DVT/CVA.  Previously on ferrous sulfate daily with development of intolerance with abdominal pain, resulting in discontinuation with resultant improvement in symptom.    She is doing well from a hematology standpoint.  She admits that her abdominal pain is improved some since stopping ferrous sulfate therapy on 03/22/2016.  She notes it is not completely resolved but definitely improved.  She denies any nausea or vomiting.  She denies any constipation.  Review of Systems  Constitutional: Negative.  Negative for chills, fever and weight loss.  HENT: Negative.  Negative for nosebleeds.   Eyes: Negative.  Negative for double vision.  Respiratory: Negative.  Negative for cough.   Cardiovascular: Negative.  Negative for chest pain.  Gastrointestinal: Negative.  Negative for abdominal pain, blood in stool, constipation, diarrhea, melena, nausea and vomiting.  Genitourinary: Negative.  Negative for hematuria.  Musculoskeletal: Negative.   Skin: Negative.   Neurological: Negative.  Negative for weakness.  Endo/Heme/Allergies: Negative.   Psychiatric/Behavioral: Negative.     Past Medical History:  Diagnosis Date  . Anxiety   . Arthritis   . Depression    History of psychosis and previous suicide attempt  . DVT, lower extremity, recurrent (HCC)    Long-term Coumadin per Dr. Legrand Rams  . Essential hypertension   . GERD (gastroesophageal reflux disease)   .  Hemiplegia ([place]) 2010   Left side  . History of stroke    Acute infarct and right cerebral white matter small vessel disease 12/10  . Leg DVT (deep venous thromboembolism), acute ([place] [place]) 2006  . Schizophrenia ([place])   . Stroke ([place])   . Type 2 diabetes mellitus ([place])     Past Surgical History:  Procedure Laterality Date  . BACK SURGERY    . BIOPSY N/A 11/24/2014   Procedure: BIOPSY;  Surgeon: Danie Binder, MD;  Location: [facility];  Service: Endoscopy;  Laterality: N/A;  . COLONOSCOPY WITH PROPOFOL N/A 11/24/2014   Dr. Rudie Meyer polyps removed/moderate sized internal hemorrhoids, tubular adenomas. Next surveillance in 3 years  . ESOPHAGOGASTRODUODENOSCOPY (EGD) WITH PROPOFOL N/A 11/24/2014   Dr. Clayburn Pert HH/patent stricture at the gastroesophageal junction, mild non-erosive gastritis, path negative for H.pylori or celiac sprue  . GIVENS CAPSULE STUDY N/A 12/11/2014   MULTILPLE EROSION IN the stomach WITH ACTIVE OOZING. OCCASIONAL EROSIONS AND RARE ULCER SEEN IN PROXIMAL SMALL BOWEL . No masses or AVMs SEEN. NO OLD BLOOD OR FRESH BLOOD SEEN.   . POLYPECTOMY N/A 11/24/2014   Procedure: POLYPECTOMY;  Surgeon: Danie Binder, MD;  Location: [facility];  Service: Endoscopy;  Laterality: N/A;    Family History  Problem Relation Age of Onset  . Hypertension Mother   . Colon cancer Neg Hx     Social History   Social History  . Marital status: Widowed    Spouse name: N/A  . Number of children: N/A  . Years of education: N/A   Social History Main Topics  . Smoking status: Former Smoker    Quit date: 01/24/1995  .  Smokeless tobacco: Never Used  . Alcohol use No  . Drug use: No  . Sexual activity: No   Other Topics Concern  . Not on file   Social History Narrative  . No narrative on file     PHYSICAL EXAMINATION  ECOG PERFORMANCE STATUS: 2 - Symptomatic, <50% confined to bed  Vitals:   04/05/16 1410  BP: (!) 111/44  Pulse: 92  Resp: 16  Temp: 97.7 F (36.5 C)     GENERAL:alert, no distress, well nourished, well developed, comfortable, cooperative, obese, smiling and accompanied by aid from group home, in wheelchair. SKIN: skin color, texture, turgor are normal, no rashes or significant lesions HEAD: Normocephalic, No masses, lesions, tenderness or abnormalities EYES: normal, EOMI, Conjunctiva are pink and non-injected EARS: External ears normal OROPHARYNX:lips, buccal mucosa, and tongue normal and mucous membranes are moist  NECK: supple, trachea midline LYMPH:  not examined BREAST:not examined LUNGS: clear to auscultation without wheezes, rales, rhonchi. HEART: regular rate & rhythm with ejection murmur heard through the precordium 3/6 ABDOMEN:abdomen soft and normal bowel sounds BACK: Back symmetric, no curvature. EXTREMITIES:less then 2 second capillary refill, no joint deformities, effusion, or inflammation, no skin discoloration, no cyanosis  NEURO: alert & oriented x 3 with fluent speech, no focal motor/sensory deficits, in wheelchair.   LABORATORY DATA: CBC    Component Value Date/Time   WBC 9.3 04/05/2016 1303   RBC 4.34 04/05/2016 1303   HGB 13.6 04/05/2016 1303   HCT 38.8 04/05/2016 1303   PLT 251 04/05/2016 1303   MCV 89.4 04/05/2016 1303   MCH 31.3 04/05/2016 1303   MCHC 35.1 04/05/2016 1303   RDW 12.6 04/05/2016 1303   LYMPHSABS 2.7 04/05/2016 1303   MONOABS 0.6 04/05/2016 1303   EOSABS 0.3 04/05/2016 1303   BASOSABS 0.0 04/05/2016 1303      Chemistry      Component Value Date/Time   NA 133 (L) 04/05/2016 1303   NA 143 02/24/2016 0820   K 3.4 (L) 04/05/2016 1303   CL 100 (L) 04/05/2016 1303   CO2 25 04/05/2016 1303   BUN 11 04/05/2016 1303   BUN 10 02/24/2016 0820   CREATININE 0.71 04/05/2016 1303   CREATININE 0.76 11/17/2015 0803      Component Value Date/Time   CALCIUM 8.7 (L) 04/05/2016 1303   ALKPHOS 85 03/11/2016 1717   AST 17 03/11/2016 1717   ALT 14 03/11/2016 1717   BILITOT 0.2 (L) 03/11/2016  1717   BILITOT 0.3 02/24/2016 0820     Lab Results  Component Value Date   IRON 141 05/26/2015   TIBC 396 05/26/2015   FERRITIN 61 11/30/2015     PENDING LABS:   RADIOGRAPHIC STUDIES:  Ct Abdomen Pelvis W Contrast  Result Date: 03/11/2016 CLINICAL DATA:  71 year old female with history of abdominal pain for the past 2 weeks. Constipation. Bloating. Loss of appetite. EXAM: CT ABDOMEN AND PELVIS WITH CONTRAST TECHNIQUE: Multidetector CT imaging of the abdomen and pelvis was performed using the standard protocol following bolus administration of intravenous contrast. CONTRAST:  47mL ISOVUE-300 IOPAMIDOL (ISOVUE-300) INJECTION 61%, 1 ISOVUE-300 IOPAMIDOL (ISOVUE-300) INJECTION 61% COMPARISON:  CT the abdomen and pelvis 09/14/2014. FINDINGS: Lower chest: Calcified granuloma in the right lower lobe. 3 mm noncalcified pulmonary nodule in the right lower lobe (image 10 of series 3) unchanged over numerous prior examinations dating back to at least February 23, 2010, considered benign. Atherosclerotic calcifications in the right coronary artery. Calcifications of the mitral annulus. Aortic atherosclerosis. Small hiatal  hernia. Hepatobiliary: The previously noted cavernous hemangioma in the right lobe of the liver continues to change. On today's study there is a central area of vascularity associated with this lesion which is best demonstrated on delayed images where it measures up to 3.2 x 2.4 cm (axial image 4 of series 7). The lesion is now surrounded by a much larger area of low attenuation, which could reflect profound fatty infiltration related to altered perfusion, or could reflect surrounding areas of cystic change and/or necrosis. The overall appearance is strongly favored to be benign. No new hepatic lesions are noted. No intra or extrahepatic biliary ductal dilatation. Small amount of high attenuation material lying dependently in the gallbladder may reflect tiny gallstones and/or biliary sludge.  Gallbladder is moderately distended, but there is no gallbladder wall thickening or surrounding inflammatory changes to suggest an acute cholecystitis at this time. Pancreas: No pancreatic mass. No pancreatic ductal dilatation. No pancreatic or peripancreatic fluid or inflammatory changes. Spleen: Unremarkable. Adrenals/Urinary Tract: Bilateral kidneys and bilateral adrenal glands are normal in appearance. There is no hydroureteronephrosis. Urinary bladder is normal in appearance. Stomach/Bowel: The intraabdominal portion of the stomach is normal. No pathologic dilatation of small bowel or colon. Normal appendix. Vascular/Lymphatic: Aortic atherosclerosis, without evidence of aneurysm or dissection in the abdominal or pelvic vasculature. Left-sided iliac veins are diminutive in the pelvis, but there are numerous large collateral vessels. Mildly enlarged hepatoduodenal ligament lymph node measuring 13 mm in short axis (image 25 of series 2). Mildly enlarged portacaval lymph node measuring 14 mm in short axis. Reproductive: Uterus and right ovary are unremarkable in appearance. In the left ovary there is a 4.3 x 3.9 x 3.1 cm low-attenuation lesion which is likely to represent a cyst. Other: No significant volume of ascites.  No pneumoperitoneum. Musculoskeletal: 9 mm of anterolisthesis of L4 upon L5. Chronic compression fractures of T10, T11 and T12 with 15% loss of anterior vertebral body height at each level. There are no aggressive appearing lytic or blastic lesions noted in the visualized portions of the skeleton. IMPRESSION: 1. No acute findings in the abdomen or pelvis to account for the patient's symptoms. 2. Large unusual appearing lesion in the right lobe of the liver. This was previously characterized as a cavernous hemangioma, and the appearance on today's examination is favored to reflect evolution of a cavernous hemangioma with developing surrounding severe hepatic steatosis related to regionally altered  perfusion. However, further evaluation of this lesion is recommended with nonemergent MRI of the abdomen with and without IV gadolinium in the near future to provide more definitive characterization, particularly in light of the mildly enlarged portacaval and hepatoduodenal ligament lymph nodes. 3. Small amount of biliary sludge and/or tiny gallstones lying dependently in the gallbladder. No findings to suggest an acute cholecystitis at this time. 4. 4.3 x 3.9 x 3.1 cm cyst in the left ovary. This is unusual in a postmenopausal female. Further evaluation with nonemergent transvaginal ultrasound is recommended in the near future to definitively characterize this lesion. This recommendation follows ACR consensus guidelines: White Paper of the ACR Incidental Findings Committee II on Adnexal Findings. J Am Coll Radiol 2013:10:675-681. 5. Aortic atherosclerosis, in addition to least right coronary artery disease. Assessment for potential risk factor modification, dietary therapy or pharmacologic therapy may be warranted, if clinically indicated. 6. Additional incidental findings, as above. Electronically Signed   By: Vinnie Langton M.D.   On: 03/11/2016 21:10   Mr Liver W TI Contrast  Result Date: 03/31/2016 CLINICAL DATA:  Abdominal pain. Evaluate lesion in right lobe of liver. EXAM: MRI ABDOMEN WITHOUT AND WITH CONTRAST TECHNIQUE: Multiplanar multisequence MR imaging of the abdomen was performed both before and after the administration of intravenous contrast. CONTRAST:  28mL MULTIHANCE GADOBENATE DIMEGLUMINE 529 MG/ML IV SOLN COMPARISON:  03/11/2016 FINDINGS: Exam detail is diminished secondary to motion artifact. Lower chest: No acute findings. Hepatobiliary: There is a stone within the deep tendon portion of the gallbladder measuring 7 mm, image 25 of series 2. No biliary ductal dilatation. Focal area of fatty deposition within the lateral aspect of the right lobe of liver is again identified, image number 30  of series 10. This measures 5.2 by 5.0 cm, image 32 of series 8. Within this area there is a ET to hyperintense structure which measures 2.4 by 2.1 cm, image 22 of series 5. On the corresponding postcontrast images there is peripheral discontinuous and nodular enhancement associated with this T2 hyperintense structure which exhibits delayed filling. The appearance is compatible with a liver hemangioma. No suspicious liver lesions identified at this time peer Pancreas: No mass, inflammatory changes, or other parenchymal abnormality identified. Spleen:  Within normal limits in size and appearance. Adrenals/Urinary Tract: No masses identified. No evidence of hydronephrosis. Stomach/Bowel: Visualized portions within the abdomen are unremarkable. Vascular/Lymphatic: No pathologically enlarged lymph nodes identified. No abdominal aortic aneurysm demonstrated. Other:  None. Musculoskeletal: Lumbar scoliosis noted. The cyst associated with the left ovary is again noted measuring 3.4 cm, image 20 of series 3 peer IMPRESSION: 1. Right lobe of liver hemangioma. Surrounding progressive focal fatty deposition is identified. 2. Gallstone. 3. Ovary cyst measuring 3.4 cm. This is almost certainly benign, but follow up ultrasound is recommended in 1 year according to the Society of Radiologists in [place] Statement (D Clovis Riley et al. Management of Asymptomatic Ovarian and Other Adnexal Cysts Imaged at [place]: Society of Radiologists in [place] Statement 2010. Radiology 256 (Sept 2010): 943-954.). 4. Lumbar scoliosis. Electronically Signed   By: Kerby Moors M.D.   On: 03/31/2016 09:40     PATHOLOGY:    ASSESSMENT AND PLAN:  IDA (iron deficiency anemia) Iron deficiency anemia in the setting of chronic anticoagulation with Coumadin for recurrent DVT/CVA.  She is S/P complete GI work-up including EGD/Colonoscopy on 11/24/2014 showing non-erosive gastritis and stricture and  internal hemorrhoids and polyps. Givens Capsule on 12/21/2014 with no AVM's.  Labs today: CBC diff, renal function panel, iron/TIBC, ferritin.  I personally reviewed and went over laboratory results with the patient.  The results are noted within this dictation.    "I do not want to come back here."  She is agreeable to return at a reduced frequency.  Oral iron was discontinued by Dr. Oneida Alar, GI due to acute abdominal pain.  This was discontinued on 03/22/2016.  Patient reports improvement in the symptom.  I reviewed options regarding oral iron replacement therapy.  I recommended Ferrex forte but still has similar side effects as over-the-counter ferrous sulfate such as nausea, vomiting, abdominal pain, and constipation, but to a lesser degree.  She is agreeable to trying this.  She knows that if her symptom returns, she is to discontinue oral iron and we will rely on IV iron replacement therapy when indicated.  Prescription for ferrex forte.  Labs in 6 months: CBC diff, BMET, iron/TIBC, ferritin.  She knows to call the clinic at anytime for labs sooner than scheduled.  Return in 6 months for follow-up.   ORDERS PLACED FOR THIS ENCOUNTER: Orders  Placed This Encounter  Procedures  . CBC with Differential  . Basic metabolic panel  . Iron and TIBC  . Ferritin    MEDICATIONS PRESCRIBED THIS ENCOUNTER: Meds ordered this encounter  Medications  . Polysacchar Iron-FA-B12 (FERREX 150 FORTE) 150-1-25 MG-MG-MCG CAPS    Sig: Take 1 capsule by mouth daily.    Dispense:  30 capsule    Refill:  11    Order Specific Question:   Supervising Provider    Answer:   Brunetta Genera [[id number]]    THERAPY PLAN:  Continue with oral iron replacement at this time.  Will continue to monitor iron studies closely and change/alter treatment and/or treatment course as indicated.  All questions were answered. The patient knows to call the clinic with any problems, questions or concerns. We can  certainly see the patient much sooner if necessary.  Patient and plan discussed with Dr. Twana First and she is in agreement with the aforementioned.   This note is electronically signed by: Doy Mince 04/05/2016 2:56 PM

## 2016-04-04 NOTE — Assessment & Plan Note (Addendum)
Iron deficiency anemia in the setting of chronic anticoagulation with Coumadin for recurrent DVT/CVA.  She is S/P complete GI work-up including EGD/Colonoscopy on 11/24/2014 showing non-erosive gastritis and stricture and internal hemorrhoids and polyps. Givens Capsule on 12/21/2014 with no AVM's.  Labs today: CBC diff, renal function panel, iron/TIBC, ferritin.  I personally reviewed and went over laboratory results with the patient.  The results are noted within this dictation.    "I do not want to come back here."  She is agreeable to return at a reduced frequency.  Oral iron was discontinued by Dr. Oneida Alar, GI due to acute abdominal pain.  This was discontinued on 03/22/2016.  Patient reports improvement in the symptom.  I reviewed options regarding oral iron replacement therapy.  I recommended Ferrex forte but still has similar side effects as over-the-counter ferrous sulfate such as nausea, vomiting, abdominal pain, and constipation, but to a lesser degree.  She is agreeable to trying this.  She knows that if her symptom returns, she is to discontinue oral iron and we will rely on IV iron replacement therapy when indicated.  Prescription for ferrex forte.  Labs in 6 months: CBC diff, BMET, iron/TIBC, ferritin.  She knows to call the clinic at anytime for labs sooner than scheduled.  Return in 6 months for follow-up.

## 2016-04-05 ENCOUNTER — Encounter (HOSPITAL_COMMUNITY): Payer: Medicare Other

## 2016-04-05 ENCOUNTER — Encounter (HOSPITAL_COMMUNITY): Payer: Self-pay | Admitting: Oncology

## 2016-04-05 ENCOUNTER — Encounter (HOSPITAL_COMMUNITY): Payer: Medicare Other | Attending: Oncology | Admitting: Oncology

## 2016-04-05 DIAGNOSIS — Z7901 Long term (current) use of anticoagulants: Secondary | ICD-10-CM | POA: Diagnosis not present

## 2016-04-05 DIAGNOSIS — Z8249 Family history of ischemic heart disease and other diseases of the circulatory system: Secondary | ICD-10-CM | POA: Diagnosis not present

## 2016-04-05 DIAGNOSIS — Z808 Family history of malignant neoplasm of other organs or systems: Secondary | ICD-10-CM | POA: Diagnosis not present

## 2016-04-05 DIAGNOSIS — Z9889 Other specified postprocedural states: Secondary | ICD-10-CM | POA: Diagnosis not present

## 2016-04-05 DIAGNOSIS — Z86718 Personal history of other venous thrombosis and embolism: Secondary | ICD-10-CM | POA: Insufficient documentation

## 2016-04-05 DIAGNOSIS — D500 Iron deficiency anemia secondary to blood loss (chronic): Secondary | ICD-10-CM | POA: Diagnosis not present

## 2016-04-05 DIAGNOSIS — E119 Type 2 diabetes mellitus without complications: Secondary | ICD-10-CM | POA: Diagnosis not present

## 2016-04-05 DIAGNOSIS — Z8673 Personal history of transient ischemic attack (TIA), and cerebral infarction without residual deficits: Secondary | ICD-10-CM | POA: Insufficient documentation

## 2016-04-05 DIAGNOSIS — Z5189 Encounter for other specified aftercare: Secondary | ICD-10-CM | POA: Diagnosis not present

## 2016-04-05 DIAGNOSIS — Z87891 Personal history of nicotine dependence: Secondary | ICD-10-CM | POA: Insufficient documentation

## 2016-04-05 DIAGNOSIS — I10 Essential (primary) hypertension: Secondary | ICD-10-CM | POA: Insufficient documentation

## 2016-04-05 LAB — RENAL FUNCTION PANEL
Albumin: 3.8 g/dL (ref 3.5–5.0)
Anion gap: 8 (ref 5–15)
BUN: 11 mg/dL (ref 6–20)
CO2: 25 mmol/L (ref 22–32)
Calcium: 8.7 mg/dL — ABNORMAL LOW (ref 8.9–10.3)
Chloride: 100 mmol/L — ABNORMAL LOW (ref 101–111)
Creatinine, Ser: 0.71 mg/dL (ref 0.44–1.00)
GFR calc Af Amer: >60 mL/min (ref >60)
GFR calc non Af Amer: >60 mL/min (ref >60)
Glucose, Bld: 106 mg/dL — ABNORMAL HIGH (ref 65–99)
Phosphorus: 4.4 mg/dL (ref 2.5–4.6)
Potassium: 3.4 mmol/L — ABNORMAL LOW (ref 3.5–5.1)
Sodium: 133 mmol/L — ABNORMAL LOW (ref 135–145)

## 2016-04-05 LAB — CBC WITH DIFFERENTIAL/PLATELET
Basophils Absolute: 0 10*3/uL (ref 0.0–0.1)
Basophils Relative: 0 %
Eosinophils Absolute: 0.3 10*3/uL (ref 0.0–0.7)
Eosinophils Relative: 3 %
HCT: 38.8 % (ref 36.0–46.0)
Hemoglobin: 13.6 g/dL (ref 12.0–15.0)
Lymphocytes Relative: 29 %
Lymphs Abs: 2.7 10*3/uL (ref 0.7–4.0)
MCH: 31.3 pg (ref 26.0–34.0)
MCHC: 35.1 g/dL (ref 30.0–36.0)
MCV: 89.4 fL (ref 78.0–100.0)
Monocytes Absolute: 0.6 10*3/uL (ref 0.1–1.0)
Monocytes Relative: 6 %
Neutro Abs: 5.7 10*3/uL (ref 1.7–7.7)
Neutrophils Relative %: 62 %
Platelets: 251 10*3/uL (ref 150–400)
RBC: 4.34 MIL/uL (ref 3.87–5.11)
RDW: 12.6 % (ref 11.5–15.5)
WBC: 9.3 10*3/uL (ref 4.0–10.5)

## 2016-04-05 LAB — IRON AND TIBC
Iron: 63 ug/dL (ref 28–170)
Saturation Ratios: 21 % (ref 10.4–31.8)
TIBC: 304 ug/dL (ref 250–450)
UIBC: 241 ug/dL

## 2016-04-05 LAB — FERRITIN: Ferritin: 104 ng/mL (ref 11–307)

## 2016-04-05 MED ORDER — POLYSACCHAR IRON-FA-B12 150-1-25 MG-MG-MCG PO CAPS: 1.0000 | ORAL_CAPSULE | Freq: Every day | ORAL | 11 refills | Status: DC

## 2016-04-05 NOTE — Patient Instructions (Signed)
[place] at [facility] Discharge Instructions  RECOMMENDATIONS MADE BY THE CONSULTANT AND ANY TEST RESULTS WILL BE SENT TO YOUR REFERRING PHYSICIAN.  You were seen today by Manning Charity Lab work today we will call you with the results Follow up with labs in 6 months We have given you a prescription for ferrex forte See Amy up front for appointments   Thank you for choosing [place] at [facility] to provide your oncology and hematology care.  To afford each patient quality time with our provider, please arrive at least 15 minutes before your scheduled appointment time.    If you have a lab appointment with the [facility] please come in thru the  Main Entrance and check in at the main information desk  You need to re-schedule your appointment should you arrive 10 or more minutes late.  We strive to give you quality time with our providers, and arriving late affects you and other patients whose appointments are after yours.  Also, if you no show three or more times for appointments you may be dismissed from the clinic at the providers discretion.     Again, thank you for choosing [facility].  Our hope is that these requests will decrease the amount of time that you wait before being seen by our physicians.       _____________________________________________________________  Should you have questions after your visit to [facility], please contact our office at [phone] between the hours of 8:30 a.m. and 4:30 p.m.  Voicemails left after 4:30 p.m. will not be returned until the following business day.  For prescription refill requests, have your pharmacy contact our office.       Resources For Cancer Patients and their Caregivers ? American Cancer Society: Can assist with transportation, wigs, general needs, runs Look Good Feel Better.        386-786-5062 ? Cancer Care: Provides financial  assistance, online support groups, medication/co-pay assistance.  1-800-813-HOPE 9126261008) ? [facility] Assists [place] cancer patients and their families through emotional , educational and financial support.  609-843-3604 ? [place] DSS Where to apply for food stamps, Medicaid and utility assistance. 8675266871 ? [facility]: Transportation to medical appointments. 9302276364 ? Social Security Administration: May apply for disability if have a Stage IV cancer. (404)859-8582 318 464 9217 ? [employer], Disability and Transit Services: Assists with nutrition, care and transit needs. [place] Support Programs: @10RELATIVEDAYS @ > Cancer Support Group  2nd Tuesday of the month 1pm-2pm, Journey Room  > Creative Journey  3rd Tuesday of the month 1130am-1pm, Journey Room  > Look Good Feel Better  1st Wednesday of the month 10am-12 noon, Journey Room (Call [place] to register [phone])

## 2016-04-05 NOTE — Addendum Note (Signed)
Addended by: Donetta Potts on: 04/05/2016 03:25 PM   Modules accepted: Orders

## 2016-04-06 ENCOUNTER — Telehealth (HOSPITAL_COMMUNITY): Payer: Self-pay | Admitting: *Deleted

## 2016-04-10 ENCOUNTER — Other Ambulatory Visit (HOSPITAL_COMMUNITY): Payer: Self-pay

## 2016-04-10 ENCOUNTER — Other Ambulatory Visit: Payer: Self-pay | Admitting: Gastroenterology

## 2016-04-10 DIAGNOSIS — D500 Iron deficiency anemia secondary to blood loss (chronic): Secondary | ICD-10-CM

## 2016-04-10 MED ORDER — POLYSACCHARIDE IRON COMPLEX 150 MG PO CAPS: 150.0000 mg | ORAL_CAPSULE | Freq: Every day | ORAL | 11 refills | Status: DC

## 2016-04-14 ENCOUNTER — Other Ambulatory Visit: Payer: Self-pay | Admitting: "Endocrinology

## 2016-04-17 ENCOUNTER — Ambulatory Visit (INDEPENDENT_AMBULATORY_CARE_PROVIDER_SITE_OTHER): Payer: Medicare Other | Admitting: *Deleted

## 2016-04-17 DIAGNOSIS — E1165 Type 2 diabetes mellitus with hyperglycemia: Secondary | ICD-10-CM | POA: Diagnosis not present

## 2016-04-17 DIAGNOSIS — Z8679 Personal history of other diseases of the circulatory system: Secondary | ICD-10-CM

## 2016-04-17 DIAGNOSIS — I82409 Acute embolism and thrombosis of unspecified deep veins of unspecified lower extremity: Secondary | ICD-10-CM | POA: Diagnosis not present

## 2016-04-17 DIAGNOSIS — Z7901 Long term (current) use of anticoagulants: Secondary | ICD-10-CM

## 2016-04-17 DIAGNOSIS — Z5181 Encounter for therapeutic drug level monitoring: Secondary | ICD-10-CM | POA: Diagnosis not present

## 2016-04-17 DIAGNOSIS — I10 Essential (primary) hypertension: Secondary | ICD-10-CM | POA: Diagnosis not present

## 2016-04-17 LAB — POCT INR: INR: 2.8

## 2016-04-20 ENCOUNTER — Telehealth: Payer: Self-pay

## 2016-04-20 ENCOUNTER — Ambulatory Visit (INDEPENDENT_AMBULATORY_CARE_PROVIDER_SITE_OTHER): Payer: Medicare Other | Admitting: Gastroenterology

## 2016-04-20 ENCOUNTER — Encounter: Payer: Self-pay | Admitting: Gastroenterology

## 2016-04-20 DIAGNOSIS — R112 Nausea with vomiting, unspecified: Secondary | ICD-10-CM | POA: Diagnosis not present

## 2016-04-20 MED ORDER — ONDANSETRON HCL 4 MG PO TABS: 4.0000 mg | ORAL_TABLET | Freq: Three times a day (TID) | ORAL | 3 refills | Status: DC

## 2016-04-20 NOTE — Telephone Encounter (Signed)
[name],   This mutual patient was seen in our office today by Roseanne Kaufman, NP.  She would like to schedule patient for an EGD with dilation with Dr. Oneida Alar in the very near future.  Please advise if pt may hold coumadin for 3 days prior to procedure and if Lovenox bridge will be needed.   Thanks so much!   Jennfer Gassen H. Nicole Kindred, LPN

## 2016-04-20 NOTE — Patient Instructions (Signed)
Continue to eat soft foods. I have recommended an endoscopy with dilation by Dr. Oneida Alar. We will need to stop the Coumadin prior, and I am asking Edrick Oh, RN, about this.   IF YOU HAVE ANY WORSENING SYMPTOMS, UNABLE TO DRINK FLUIDS, DARK URINE, UNABLE TO EAT SOFT FOODS, YOU NEED TO GO TO THE EMERGENCY ROOM.

## 2016-04-20 NOTE — Telephone Encounter (Signed)
Forwarding to [facility].

## 2016-04-20 NOTE — Telephone Encounter (Signed)
Pt can hold coumadin 3 days prior to procedure but will need to be bridged with Lovenox due to H/O CVA.  Let me know when she is scheduled. Thanks, Lattie Haw

## 2016-04-20 NOTE — Telephone Encounter (Signed)
Forwarding to Roseanne Kaufman, NP  And [facility] Clinical staff to schedule and to let Lattie Haw know the date.

## 2016-04-20 NOTE — Progress Notes (Addendum)
REVIEWED-NO ADDITIONAL RECOMMENDATIONS.  Referring Provider: [name], MD Primary Care Physician:  [name], MD  Primary GI: Dr. Oneida Alar   Chief Complaint  Patient presents with  . Abdominal Pain  . Nausea    "can't eat"  . Constipation    HPI:   [name] is a 71 y.o. female presenting today with a history of IDA. Colonoscopy/EGD, capsule study completed Nov 2016. IDA felt to be related to erosive gastropathy and enteropathy. Next colonoscopy due in 2019.   Indeterminate lesion right hepatic lobe on CT Feb 2018 and recommended MRI liver, which showed right lobe of liver hemangioma. . Abdominal pain with constipation. No acute changes or evidence for mesenteric stenosis/ischemia on CT Feb 2018. Added Movantik at last office visit. She has had progressive weight loss since Nov 2017.   Nausea all the time. Can't eat chicken. Only yogurt and vanilla wafers. Didn't eat anything for breakfast. Yogurt for dinner. Ate some pears and cottage cheese, which came back. Present for about a month. Pain all over. BM about every other day. Doesn't feel constipation is an issue. Notes that anything that is more than "soft" will "come back up". She is quite frustrated at this visit and difficult to obtain history, stating she feels awful. I have asked her to present to the ED if she is feeling this bad, and she refuses this, continuing to ask "what are you going to do for me".   She is on Coumadin and will need Lovenox bridging prior to procedures.   Past Medical History:  Diagnosis Date  . Anxiety   . Arthritis   . Depression    History of psychosis and previous suicide attempt  . DVT, lower extremity, recurrent (HCC)    Long-term Coumadin per Dr. Legrand Rams  . Essential hypertension   . GERD (gastroesophageal reflux disease)   . Hemiplegia ([place]) 2010   Left side  . History of stroke    Acute infarct and right cerebral white matter small vessel disease 12/10  . Leg DVT (deep  venous thromboembolism), acute ([place]) 2006  . Schizophrenia ([place])   . Stroke ([place])   . Type 2 diabetes mellitus ([place])     Past Surgical History:  Procedure Laterality Date  . BACK SURGERY    . BIOPSY N/A 11/24/2014   Procedure: BIOPSY;  Surgeon: Danie Binder, MD;  Location: [facility];  Service: Endoscopy;  Laterality: N/A;  . COLONOSCOPY WITH PROPOFOL N/A 11/24/2014   Dr. Rudie Meyer polyps removed/moderate sized internal hemorrhoids, tubular adenomas. Next surveillance in 3 years  . ESOPHAGOGASTRODUODENOSCOPY (EGD) WITH PROPOFOL N/A 11/24/2014   Dr. Clayburn Pert HH/patent stricture at the gastroesophageal junction, mild non-erosive gastritis, path negative for H.pylori or celiac sprue  . GIVENS CAPSULE STUDY N/A 12/11/2014   MULTILPLE EROSION IN the stomach WITH ACTIVE OOZING. OCCASIONAL EROSIONS AND RARE ULCER SEEN IN PROXIMAL SMALL BOWEL . No masses or AVMs SEEN. NO OLD BLOOD OR FRESH BLOOD SEEN.   . POLYPECTOMY N/A 11/24/2014   Procedure: POLYPECTOMY;  Surgeon: Danie Binder, MD;  Location: [facility];  Service: Endoscopy;  Laterality: N/A;    Current Outpatient Prescriptions  Medication Sig Dispense Refill  . acetaminophen (TYLENOL) 325 MG tablet Take 650 mg by mouth every 6 (six) hours as needed. For fever > 101     . ARIPiprazole (ABILIFY) 2 MG tablet Take 2 mg by mouth daily.     . calcium carbonate (TUMS - DOSED IN MG ELEMENTAL CALCIUM) 500 MG chewable tablet  Chew 1 tablet by mouth 4 (four) times daily as needed for indigestion or heartburn.     . clonazePAM (KLONOPIN) 0.5 MG tablet Take 0.5 mg by mouth 3 (three) times daily.     Marland Kitchen docusate sodium (COLACE) 100 MG capsule Take 100 mg by mouth daily as needed.     . DULoxetine (CYMBALTA) 60 MG capsule Take 120 mg by mouth daily.     . enalapril (VASOTEC) 2.5 MG tablet Take 1.25 mg by mouth daily.     Marland Kitchen HYDROcodone-acetaminophen (NORCO) 5-325 MG per tablet Take 1 tablet by mouth 3 (three) times daily.     . Insulin Detemir (LEVEMIR  FLEXTOUCH) 100 UNIT/ML Pen INJECT 80 UNITS SUBCUTANEOUSLY AT BEDTIME. (Patient taking differently: Inject 80 Units into the skin at bedtime. ) 30 mL 2  . iron polysaccharides (NIFEREX) 150 MG capsule Take 1 capsule (150 mg total) by mouth daily. 30 capsule 11  . latanoprost (XALATAN) 0.005 % ophthalmic solution Place 1 drop into both eyes at bedtime.    Marland Kitchen loperamide (IMODIUM) 2 MG capsule Take 4 mg by mouth daily as needed for diarrhea or loose stools. Take 2 capsules initially then take 2 after each loose stool per MAR    . metoCLOPramide (REGLAN) 5 MG tablet Take 1 tablet (5 mg total) by mouth every 8 (eight) hours as needed for nausea or vomiting (or bloating). 5 tablet 0  . metoprolol succinate (TOPROL-XL) 25 MG 24 hr tablet Take 12.5 mg by mouth 2 (two) times daily.     . mirtazapine (REMERON) 30 MG tablet Take 30 mg by mouth at bedtime.      . naloxegol oxalate (MOVANTIK) 12.5 MG TABS tablet Take 1 tablet (12.5 mg total) by mouth daily. 30 tablet 11  . NOVOLOG FLEXPEN 100 UNIT/ML FlexPen INJECT 25 UNITS SUBCUTANEOUSLY BEFORE MEALS IF BS IS BETWEEN 90-150. 150-200 ADD 1 UNIT , 201-250 ADD 2 UNITS 15 mL 2  . NOVOLOG FLEXPEN 100 UNIT/ML FlexPen SS: 251-300 ADD 3 UNITS; 301-350 ADD 4 UNITS; 351-400 ADD 5 UNITS; 401-450 ADD 6 UNITS AND CALL DOCTOR. 15 mL 2  . omeprazole (PRILOSEC) 40 MG capsule Take 40 mg by mouth daily.    . polyvinyl alcohol (ARTIFICIAL TEARS) 1.4 % ophthalmic solution Place 1 drop into both eyes 3 (three) times daily.    . promethazine (PHENERGAN) 12.5 MG tablet TAKE 1 TABLET BY MOUTH EVERY 6 HOURS AS NEEDED FOR NAUSEA OR VOMITING. 30 tablet 0  . risperiDONE (RISPERDAL) 2 MG tablet Take 2 mg by mouth 2 (two) times daily.    . simvastatin (ZOCOR) 20 MG tablet Take 1 tablet by mouth daily.    . solifenacin (VESICARE) 5 MG tablet Take 5 mg by mouth daily.    Marland Kitchen tiZANidine (ZANAFLEX) 2 MG tablet Take 2 mg by mouth daily.    . Vitamin D, Ergocalciferol, (DRISDOL) 50000 UNITS CAPS  capsule Take 50,000 Units by mouth every 7 (seven) days. Takes on Fridays.    Marland Kitchen warfarin (COUMADIN) 5 MG tablet Take as directed by [facility] (Patient taking differently: Takes 1 tablet daily on Sun, Tues, Thurs, Sat. Takes 1/2 tablet Mon, Wed, Fri) 30 tablet 1  . zolpidem (AMBIEN) 10 MG tablet Take 10 mg by mouth at bedtime.    . ondansetron (ZOFRAN) 4 MG tablet Take 1 tablet (4 mg total) by mouth 4 (four) times daily -  before meals and at bedtime. 120 tablet 3   No current facility-administered medications for this visit.  Allergies as of 04/20/2016 - Review Complete 04/20/2016  Allergen Reaction Noted  . Sulfa antibiotics Rash 02/23/2010  . Sulfonamide derivatives Rash     Family History  Problem Relation Age of Onset  . Hypertension Mother   . Colon cancer Neg Hx     Social History   Social History  . Marital status: Widowed    Spouse name: N/A  . Number of children: N/A  . Years of education: N/A   Social History Main Topics  . Smoking status: Former Smoker    Quit date: 01/24/1995  . Smokeless tobacco: Never Used  . Alcohol use No  . Drug use: No  . Sexual activity: No   Other Topics Concern  . None   Social History Narrative  . None    Review of Systems: As mentioned in HPI.   Physical Exam: BP 120/60   Pulse (!) 102   Temp 98 F (36.7 C) (Oral)   Ht 5\' 6"  (1.676 m)   Wt 159 lb 6.4 oz (72.3 kg)   BMI 25.73 kg/m  General:   Alert and oriented. Flat affect. Irritable.  Eyes:  Conjuctiva clear without scleral icterus. Mouth:  Oral mucosa pink and moist.  Heart:  S1, S2 present without murmurs Abdomen:  +BS, soft, non-tender and non-distended. No rebound or guarding. No HSM or masses noted. Msk:  Symmetrical without gross deformities. Normal posture. Extremities:  Without edema. Neurologic:  Alert and  oriented x4 Psych:  Alert and cooperative. Flat affect

## 2016-04-22 NOTE — Assessment & Plan Note (Signed)
71 year old female with worsening nausea, inability to tolerate foods other than soft textures, with associated vomiting. Difficult historian. No acute symptoms today to necessitate ED evaluation. History of patent stricture at GE junction in 2016. Recommend EGD with dilation in near future. I have changed Zofran dosing to be scheduled before meals. I have also instructed her to present to the ED if any difficulty keeping fluids down or soft foods. Caretaker present and understands. Abdominal exam benign.   Will need to hold Coumadin X 3 days: will discuss with Edrick Oh, RN, as she will likely need lovenox bridging Proceed with upper endoscopy /dilation in the near future with Dr. Oneida Alar. The risks, benefits, and alternatives have been discussed in detail with patient. They have stated understanding and desire to proceed.  PROPOFOL

## 2016-04-24 ENCOUNTER — Encounter: Payer: Self-pay | Admitting: Gastroenterology

## 2016-04-24 NOTE — Progress Notes (Signed)
cc'd to pcp 

## 2016-04-24 NOTE — Telephone Encounter (Signed)
Sounds good. To [facility] staff: let me know the date so I can send in Lovenox.

## 2016-04-25 ENCOUNTER — Other Ambulatory Visit: Payer: Self-pay

## 2016-04-25 DIAGNOSIS — R8290 Unspecified abnormal findings in urine: Secondary | ICD-10-CM

## 2016-04-25 NOTE — Progress Notes (Signed)
Butch Penny at [facility] is aware and I am faxing the order to her.

## 2016-04-27 ENCOUNTER — Other Ambulatory Visit: Payer: Self-pay

## 2016-04-27 ENCOUNTER — Telehealth: Payer: Self-pay

## 2016-04-27 NOTE — Telephone Encounter (Signed)
Butch Penny from [facility] left VM yesterday she did not received the orders for the UA with micro for pt. I have just refaxed the orders and called and told Erline Levine at [facility]. She will let me know if they do not receive them.

## 2016-04-28 ENCOUNTER — Other Ambulatory Visit: Payer: Self-pay

## 2016-04-28 DIAGNOSIS — R1310 Dysphagia, unspecified: Secondary | ICD-10-CM

## 2016-04-28 MED ORDER — ENOXAPARIN SODIUM 80 MG/0.8ML ~~LOC~~ SOLN: 1.0000 mg/kg | Freq: Two times a day (BID) | SUBCUTANEOUS | 0 refills | Status: DC

## 2016-04-28 NOTE — Telephone Encounter (Signed)
Spoke with Butch Penny at [facility]. EGD/Dil w/Propofol with SLF scheduled for 05/17/16 at 12:45pm. Informed Butch Penny that pt is to hold Coumadin for 3 days before procedure. Butch Penny said that [facility] would have to give pt Lovenox. Will fax instructions to [phone] when pre-op appt is scheduled.

## 2016-04-28 NOTE — Telephone Encounter (Signed)
Called [facility]. Left message for Butch Penny to call office back to schedule procedure.

## 2016-04-28 NOTE — Telephone Encounter (Signed)
Will give 1mg /kg BID Lovenox as bridge. Hold Coumadin X 3 days. I sent Lovenox to pharmacy. Here are instructions:  Procedure is on April 25th. Needs schedule as below. Please route to Edrick Oh, RN, to arrange appt shortly thereafter to know how to adjust Coumadin. Thanks!   Last dose of Coumadin April 21.   April 22: Lovenox BID  April 23: Lovenox BID  April 24: Lovenox only in morning  April 25 :procedure

## 2016-04-28 NOTE — Telephone Encounter (Signed)
Patient is scheduled for EGD/Dil w/Propofol with SLF 05/17/16 at 12:45pm. Butch Penny at [facility] is aware that pt will need to hold Coumadin for 3 days before procedure and will need Lovenox bridge. She said [facility] would have to give pt Lovenox.

## 2016-04-28 NOTE — Telephone Encounter (Signed)
Pre-op appt 05/12/16 at 1:45pm. Pre-op appt and procedure instructions faxed to [facility] at [place].

## 2016-05-01 ENCOUNTER — Encounter: Payer: Self-pay | Admitting: Gastroenterology

## 2016-05-01 ENCOUNTER — Other Ambulatory Visit: Payer: Self-pay

## 2016-05-01 DIAGNOSIS — F331 Major depressive disorder, recurrent, moderate: Secondary | ICD-10-CM | POA: Diagnosis not present

## 2016-05-01 DIAGNOSIS — R8290 Unspecified abnormal findings in urine: Secondary | ICD-10-CM | POA: Diagnosis not present

## 2016-05-01 NOTE — Telephone Encounter (Signed)
Letter printed with Coumadin/Lovenox instructions. Faxed to Butch Penny at [facility]. Called [facility] to make sure they received the fax. Butch Penny had already left for the day. Baker Janus confirmed that they did receive the fax.  Routing message to Edrick Oh, RN to arrange appt to adjust Coumadin.

## 2016-05-01 NOTE — Telephone Encounter (Signed)
Tretha Sciara, did you send in the instructions for the Lovenox Bridge?

## 2016-05-01 NOTE — Telephone Encounter (Signed)
T/C from Vicente Males at [facility], we reviewed the instructions and dates for holding the coumadin and taking the Lovenox, so she could make sure the facility knew exactly what to do.

## 2016-05-02 LAB — URINALYSIS
Bilirubin, UA: NEGATIVE
Glucose, UA: NEGATIVE
Ketones, UA: NEGATIVE
Nitrite, UA: NEGATIVE
Protein, UA: NEGATIVE
RBC, UA: NEGATIVE
Specific Gravity, UA: 1.019 (ref 1.005–1.030)
Urobilinogen, Ur: 0.2 mg/dL (ref 0.2–1.0)
pH, UA: 6 (ref 5.0–7.5)

## 2016-05-02 NOTE — Telephone Encounter (Signed)
Spoke with Butch Penny at [facility].  Appt made for 05/22/16 at 10:50am.  Appt for 4/23 cancelled since pt will be off coumadin at that time.

## 2016-05-02 NOTE — Progress Notes (Signed)
I called [facility] and spoke to [place] and she is adding the micro.

## 2016-05-02 NOTE — Telephone Encounter (Signed)
[name] at [facility] called office and said our office would need to contact [place] to arrange Lovenox (they can't give Lovenox at [facility]). [place] [facility] [phone]) and spoke to [name]. She advised to fax Lovenox instructions on prescription pad and fax to [phone] and they would arrange. Lovenox instructions faxed to [facility].

## 2016-05-03 LAB — URINALYSIS, MICROSCOPIC ONLY
Bacteria, UA: NONE SEEN
Casts: NONE SEEN /lpf
WBC, UA: >30 /hpf — AB (ref 0–?)

## 2016-05-03 LAB — SPECIMEN STATUS REPORT

## 2016-05-03 NOTE — Telephone Encounter (Signed)
I received a call back from [place] asking for orders the pt. I told her we have faxed the orders, but that Baker Janus at [facility] was calling to take care of the need for Home Health at the facility.   I then called Baker Janus and she said she spoke to [place] at [facility] and they are sending someone out for them, and there is nothing else they need from [place]. Prescription was sent to [facility] and instructions has been faxed to [facility] at [facility].  I called back and left the message on Vm for Minna Merritts that Gwynne Edinger is aware of the situation and is handling it.

## 2016-05-03 NOTE — Progress Notes (Signed)
Micro result printed and placed in Dr. Nona Dell chair for review.

## 2016-05-03 NOTE — Telephone Encounter (Signed)
PLEASE CALL PT. HER US SHOWS A BLADDER INFECTION. THIS CAN CAUSE CHRONIC NAUSEA AND LOWER ABDOMINAL PAIN. SHE SHOULD SUBMIT A URINE SAMPLE FOR CULTURE. SHE SHOULD START AMOXICILLIN 500 MG TID FOR 3 DAYS, #QS, RFX0. WE WILL NOTIFY LISA REID THAT WE ARE STARTING ABX. ABX CAN MAKE YOUR COUMADIN MORE POWERFUL SO DURING ABX ONLY TAKE 1/2 COUMADIN TABLET.

## 2016-05-03 NOTE — Telephone Encounter (Signed)
I received a phone call from Rincon @ [facility]. She was inquiring why we faxed the orders to them for the Lovenox. I told her that Butch Penny @ [facility] told us we had to contact [facility] to do the Lovenox injections. She ask if the pt was at home and I told her no, she is at [facility]. She said that Butch Penny needs to call them to discuss. Call back : Minna Merritts @ [phone].  I called [facility] and spoke to [place] who said Butch Penny was not in today. I gave her the information and she said she would take care of it.

## 2016-05-04 ENCOUNTER — Other Ambulatory Visit: Payer: Self-pay

## 2016-05-04 ENCOUNTER — Telehealth: Payer: Self-pay | Admitting: *Deleted

## 2016-05-04 DIAGNOSIS — N3090 Cystitis, unspecified without hematuria: Secondary | ICD-10-CM

## 2016-05-04 MED ORDER — ENOXAPARIN SODIUM 100 MG/ML ~~LOC~~ SOLN: 100.0000 mg | SUBCUTANEOUS | 1 refills | Status: DC

## 2016-05-04 NOTE — Telephone Encounter (Signed)
Lovenox orders faxed to [facility] and [facility].

## 2016-05-04 NOTE — Plan of Care (Signed)
Pt is scheduled for EGD 05/17/16 by Dr Oneida Alar.   Pt to hold coumadin x 4 days and be bridged with lovenox. Pt resides at [facility].  Staff is not allowed to administer Lovenox injections. Orders given to [place] to administer Lovenox as follows:  4/20  Take last dose of coumadin 4/21  No lovenox or coumadin 4/22  Lovenox 100mg  SQ at 8am 4/23   Lovenox 100mg  SQ at 8am 4/24   Lovenox 100mg  SQ at 8am 4/25  NO Lovenox----procedure----coumadin 5mg  at 6pm 4/26  Lovenox 100mg  sq @ 8am & coumadin 5mg  @ 6p 4/27  Lovenox 100mg  sq @ 8am & coumadin 5mg  @ 6p 4/28  Lovenox 100mg  sq @ 8am & coumadin 5mg  @ 6p 4/29  Lovenox 100mg  sq @ 8am & coumadin 5mg  @ 6p 4/30  INR check by [place] -- Call to [facility] all previous Lovenox orders by Roseanne Kaufman NP since [facility] request to see pt once daily for injections instead of twice daily.  Lovenox dosage changed to accommodate.

## 2016-05-04 NOTE — Telephone Encounter (Signed)
I called and informed Butch Penny at [facility]. I called [facility] and called the prescription to [facility]. Order for urine culture faxed to [facility] [phone].  I am forwarding this note FYI to Edrick Oh in reference to the coumadin while pt is on Amoxicillin.

## 2016-05-05 ENCOUNTER — Other Ambulatory Visit: Payer: Self-pay | Admitting: Gastroenterology

## 2016-05-05 DIAGNOSIS — N3090 Cystitis, unspecified without hematuria: Secondary | ICD-10-CM | POA: Diagnosis not present

## 2016-05-09 LAB — URINE CULTURE: Organism ID, Bacteria: NO GROWTH

## 2016-05-09 LAB — PLEASE NOTE

## 2016-05-09 NOTE — Patient Instructions (Signed)
Urine culture results placed in SLF's chair.

## 2016-05-09 NOTE — Telephone Encounter (Signed)
Tried to call Lattie Haw and she is in [place] today. I called @ [phone] and left message with C[place] to let Lattie Haw know about this message in epic.

## 2016-05-10 NOTE — Patient Instructions (Signed)
[name]  05/10/2016     @PREFPERIOPPHARMACY @   Your procedure is scheduled on  05/17/2016  Report to [facility] at  85  A.M.  Call this number if you have problems the morning of surgery:  [phone]   Remember:  Do not eat food or drink liquids after midnight.  Take these medicines the morning of surgery with A SIP OF WATER  prilosec, zofran, risperdil, vesicare, zanaflex, abilify, klonopin, cymbalta, vasotec, hydrocodone, reglan, metoprolol, movantik. Take 1/2 of your normal night time insulin the night before your procedure. DO NOT take any medications for diabetes the morning of your procedure.    Do not wear jewelry, make-up or nail polish.  Do not wear lotions, powders, or perfumes, or deoderant.  Do not shave 48 hours prior to surgery.  Men may shave face and neck.  Do not bring valuables to the hospital.  [facility] is not responsible for any belongings or valuables.  Contacts, dentures or bridgework may not be worn into surgery.  Leave your suitcase in the car.  After surgery it may be brought to your room.  For patients admitted to the hospital, discharge time will be determined by your treatment team.  Patients discharged the day of surgery will not be allowed to drive home.   Name and phone number of your driver:  Family Special instructions:  Follow the instructions given to you for lovenox injection and stoppage of blood thinner.  Please read over the following fact sheets that you were given. Anesthesia Post-op Instructions and Care and Recovery After Surgery       Esophagogastroduodenoscopy Esophagogastroduodenoscopy (EGD) is a procedure to examine the lining of the esophagus, stomach, and first part of the small intestine (duodenum). This procedure is done to check for problems such as inflammation, bleeding, ulcers, or growths. During this procedure, a long, flexible, lighted tube with a camera attached (endoscope) is inserted  down the throat. Tell a health care provider about:  Any allergies you have.  All medicines you are taking, including vitamins, herbs, eye drops, creams, and over-the-counter medicines.  Any problems you or family members have had with anesthetic medicines.  Any blood disorders you have.  Any surgeries you have had.  Any medical conditions you have.  Whether you are pregnant or may be pregnant. What are the risks? Generally, this is a safe procedure. However, problems may occur, including:  Infection.  Bleeding.  A tear (perforation) in the esophagus, stomach, or duodenum.  Trouble breathing.  Excessive sweating.  Spasms of the larynx.  A slowed heartbeat.  Low blood pressure. What happens before the procedure?  Follow instructions from your health care provider about eating or drinking restrictions.  Ask your health care provider about:  Changing or stopping your regular medicines. This is especially important if you are taking diabetes medicines or blood thinners.  Taking medicines such as aspirin and ibuprofen. These medicines can thin your blood. Do not take these medicines before your procedure if your health care provider instructs you not to.  Plan to have someone take you home after the procedure.  If you wear dentures, be ready to remove them before the procedure. What happens during the procedure?  To reduce your risk of infection, your health care team will wash or sanitize their hands.  An IV tube will be put in a vein in your hand or arm. You will get medicines and  fluids through this tube.  You will be given one or more of the following:  A medicine to help you relax (sedative).  A medicine to numb the area (local anesthetic). This medicine may be sprayed into your throat. It will make you feel more comfortable and keep you from gagging or coughing during the procedure.  A medicine for pain.  A mouth guard may be placed in your mouth to  protect your teeth and to keep you from biting on the endoscope.  You will be asked to lie on your left side.  The endoscope will be lowered down your throat into your esophagus, stomach, and duodenum.  Air will be put into the endoscope. This will help your health care provider see better.  The lining of your esophagus, stomach, and duodenum will be examined.  Your health care provider may:  Take a tissue sample so it can be looked at in a lab (biopsy).  Remove growths.  Remove objects (foreign bodies) that are stuck.  Treat any bleeding with medicines or other devices that stop tissue from bleeding.  Widen (dilate) or stretch narrowed areas of your esophagus and stomach.  The endoscope will be taken out. The procedure may vary among health care providers and hospitals. What happens after the procedure?  Your blood pressure, heart rate, breathing rate, and blood oxygen level will be monitored often until the medicines you were given have worn off.  Do not eat or drink anything until the numbing medicine has worn off and your gag reflex has returned. This information is not intended to replace advice given to you by your health care provider. Make sure you discuss any questions you have with your health care provider. Document Released: 05/12/2004 Document Revised: 06/17/2015 Document Reviewed: 12/03/2014 Elsevier Interactive Patient Education  2017 [place]. Esophagogastroduodenoscopy, Care After Refer to this sheet in the next few weeks. These instructions provide you with information about caring for yourself after your procedure. Your health care provider may also give you more specific instructions. Your treatment has been planned according to current medical practices, but problems sometimes occur. Call your health care provider if you have any problems or questions after your procedure. What can I expect after the procedure? After the procedure, it is common to  have:  A sore throat.  Nausea.  Bloating.  Dizziness.  Fatigue. Follow these instructions at home:  Do not eat or drink anything until the numbing medicine (local anesthetic) has worn off and your gag reflex has returned. You will know that the local anesthetic has worn off when you can swallow comfortably.  Do not drive for 24 hours if you received a medicine to help you relax (sedative).  If your health care provider took a tissue sample for testing during the procedure, make sure to get your test results. This is your responsibility. Ask your health care provider or the department performing the test when your results will be ready.  Keep all follow-up visits as told by your health care provider. This is important. Contact a health care provider if:  You cannot stop coughing.  You are not urinating.  You are urinating less than usual. Get help right away if:  You have trouble swallowing.  You cannot eat or drink.  You have throat or chest pain that gets worse.  You are dizzy or light-headed.  You faint.  You have nausea or vomiting.  You have chills.  You have a fever.  You have severe abdominal  pain.  You have black, tarry, or bloody stools. This information is not intended to replace advice given to you by your health care provider. Make sure you discuss any questions you have with your health care provider. Document Released: 12/27/2011 Document Revised: 06/17/2015 Document Reviewed: 12/03/2014 Elsevier Interactive Patient Education  2017 [employer]  Esophageal Dilatation Esophageal dilatation is a procedure to open a blocked or narrowed part of the esophagus. The esophagus is the long tube in your throat that carries food and liquid from your mouth to your stomach. The procedure is also called esophageal dilation. You may need this procedure if you have a buildup of scar tissue in your esophagus that makes it difficult, painful, or even impossible to  swallow. This can be caused by gastroesophageal reflux disease (GERD). In rare cases, people need this procedure because they have cancer of the esophagus or a problem with the way food moves through the esophagus. Sometimes you may need to have another dilatation to enlarge the opening of the esophagus gradually. Tell a health care provider about:  Any allergies you have.  All medicines you are taking, including vitamins, herbs, eye drops, creams, and over-the-counter medicines.  Any problems you or family members have had with anesthetic medicines.  Any blood disorders you have.  Any surgeries you have had.  Any medical conditions you have.  Any antibiotic medicines you are required to take before dental procedures. What are the risks? Generally, this is a safe procedure. However, problems can occur and include:  Bleeding from a tear in the lining of the esophagus.  A hole (perforation) in the esophagus. What happens before the procedure?  Do not eat or drink anything after midnight on the night before the procedure or as directed by your health care provider.  Ask your health care provider about changing or stopping your regular medicines. This is especially important if you are taking diabetes medicines or blood thinners.  Plan to have someone take you home after the procedure. What happens during the procedure?  You will be given a medicine that makes you relaxed and sleepy (sedative).  A medicine may be sprayed or gargled to numb the back of the throat.  Your health care provider can use various instruments to do an esophageal dilatation. During the procedure, the instrument used will be placed in your mouth and passed down into your esophagus. Options include:  Simple dilators. This instrument is carefully placed in the esophagus to stretch it.  Guided wire bougies. In this method, a flexible tube (endoscope) is used to insert a wire into the esophagus. The dilator is  passed over this wire to enlarge the esophagus. Then the wire is removed.  Balloon dilators. An endoscope with a small balloon at the end is passed down into the esophagus. Inflating the balloon gently stretches the esophagus and opens it up. What happens after the procedure?  Your blood pressure, heart rate, breathing rate, and blood oxygen level will be monitored often until the medicines you were given have worn off.  Your throat may feel slightly sore and will probably still feel numb. This will improve slowly over time.  You will not be allowed to eat or drink until the throat numbness has resolved.  If this is a same-day procedure, you may be allowed to go home once you have been able to drink, urinate, and sit on the edge of the bed without nausea or dizziness.  If this is a same-day procedure, you should  have a friend or family member with you for the next 24 hours after the procedure. This information is not intended to replace advice given to you by your health care provider. Make sure you discuss any questions you have with your health care provider. Document Released: 03/02/2005 Document Revised: 06/17/2015 Document Reviewed: 05/21/2013 Elsevier Interactive Patient Education  2017 [place] Anesthesia is a term that refers to techniques, procedures, and medicines that help a person stay safe and comfortable during a medical procedure. Monitored anesthesia care, or sedation, is one type of anesthesia. Your anesthesia specialist may recommend sedation if you will be having a procedure that does not require you to be unconscious, such as:  Cataract surgery.  A dental procedure.  A biopsy.  A colonoscopy. During the procedure, you may receive a medicine to help you relax (sedative). There are three levels of sedation:  Mild sedation. At this level, you may feel awake and relaxed. You will be able to follow directions.  Moderate sedation. At this  level, you will be sleepy. You may not remember the procedure.  Deep sedation. At this level, you will be asleep. You will not remember the procedure. The more medicine you are given, the deeper your level of sedation will be. Depending on how you respond to the procedure, the anesthesia specialist may change your level of sedation or the type of anesthesia to fit your needs. An anesthesia specialist will monitor you closely during the procedure. Let your health care provider know about:  Any allergies you have.  All medicines you are taking, including vitamins, herbs, eye drops, creams, and over-the-counter medicines.  Any use of steroids (by mouth or as a cream).  Any problems you or family members have had with sedatives and anesthetic medicines.  Any blood disorders you have.  Any surgeries you have had.  Any medical conditions you have, such as sleep apnea.  Whether you are pregnant or may be pregnant.  Any use of cigarettes, alcohol, or street drugs. What are the risks? Generally, this is a safe procedure. However, problems may occur, including:  Getting too much medicine (oversedation).  Nausea.  Allergic reaction to medicines.  Trouble breathing. If this happens, a breathing tube may be used to help with breathing. It will be removed when you are awake and breathing on your own.  Heart trouble.  Lung trouble. Before the procedure Staying hydrated  Follow instructions from your health care provider about hydration, which may include:  Up to 2 hours before the procedure - you may continue to drink clear liquids, such as water, clear fruit juice, black coffee, and plain tea. Eating and drinking restrictions  Follow instructions from your health care provider about eating and drinking, which may include:  8 hours before the procedure - stop eating heavy meals or foods such as meat, fried foods, or fatty foods.  6 hours before the procedure - stop eating light meals  or foods, such as toast or cereal.  6 hours before the procedure - stop drinking milk or drinks that contain milk.  2 hours before the procedure - stop drinking clear liquids. Medicines  Ask your health care provider about:  Changing or stopping your regular medicines. This is especially important if you are taking diabetes medicines or blood thinners.  Taking medicines such as aspirin and ibuprofen. These medicines can thin your blood. Do not take these medicines before your procedure if your health care provider instructs you not to.  Tests and exams  You will have a physical exam.  You may have blood tests done to show:  How well your kidneys and liver are working.  How well your blood can clot.  General instructions  Plan to have someone take you home from the hospital or clinic.  If you will be going home right after the procedure, plan to have someone with you for 24 hours. What happens during the procedure?  Your blood pressure, heart rate, breathing, level of pain and overall condition will be monitored.  An IV tube will be inserted into one of your veins.  Your anesthesia specialist will give you medicines as needed to keep you comfortable during the procedure. This may mean changing the level of sedation.  The procedure will be performed. After the procedure  Your blood pressure, heart rate, breathing rate, and blood oxygen level will be monitored until the medicines you were given have worn off.  Do not drive for 24 hours if you received a sedative.  You may:  Feel sleepy, clumsy, or nauseous.  Feel forgetful about what happened after the procedure.  Have a sore throat if you had a breathing tube during the procedure.  Vomit. This information is not intended to replace advice given to you by your health care provider. Make sure you discuss any questions you have with your health care provider. Document Released: 10/05/2004 Document Revised: 06/18/2015  Document Reviewed: 05/02/2015 Elsevier Interactive Patient Education  2017 [place], Care After These instructions provide you with information about caring for yourself after your procedure. Your health care provider may also give you more specific instructions. Your treatment has been planned according to current medical practices, but problems sometimes occur. Call your health care provider if you have any problems or questions after your procedure. What can I expect after the procedure? After your procedure, it is common to:  Feel sleepy for several hours.  Feel clumsy and have poor balance for several hours.  Feel forgetful about what happened after the procedure.  Have poor judgment for several hours.  Feel nauseous or vomit.  Have a sore throat if you had a breathing tube during the procedure. Follow these instructions at home: For at least 24 hours after the procedure:    Do not:  Participate in activities in which you could fall or become injured.  Drive.  Use heavy machinery.  Drink alcohol.  Take sleeping pills or medicines that cause drowsiness.  Make important decisions or sign legal documents.  Take care of children on your own.  Rest. Eating and drinking   Follow the diet that is recommended by your health care provider.  If you vomit, drink water, juice, or soup when you can drink without vomiting.  Make sure you have little or no nausea before eating solid foods. General instructions   Have a responsible adult stay with you until you are awake and alert.  Take over-the-counter and prescription medicines only as told by your health care provider.  If you smoke, do not smoke without supervision.  Keep all follow-up visits as told by your health care provider. This is important. Contact a health care provider if:  You keep feeling nauseous or you keep vomiting.  You feel light-headed.  You develop a  rash.  You have a fever. Get help right away if:  You have trouble breathing. This information is not intended to replace advice given to you by your health care provider. Make  sure you discuss any questions you have with your health care provider. Document Released: 05/02/2015 Document Revised: 09/01/2015 Document Reviewed: 05/02/2015 Elsevier Interactive Patient Education  2017 [employer].

## 2016-05-11 DIAGNOSIS — F333 Major depressive disorder, recurrent, severe with psychotic symptoms: Secondary | ICD-10-CM | POA: Diagnosis not present

## 2016-05-11 DIAGNOSIS — I825Y9 Chronic embolism and thrombosis of unspecified deep veins of unspecified proximal lower extremity: Secondary | ICD-10-CM | POA: Diagnosis not present

## 2016-05-11 DIAGNOSIS — E1165 Type 2 diabetes mellitus with hyperglycemia: Secondary | ICD-10-CM | POA: Diagnosis not present

## 2016-05-11 DIAGNOSIS — G8190 Hemiplegia, unspecified affecting unspecified side: Secondary | ICD-10-CM | POA: Diagnosis not present

## 2016-05-12 ENCOUNTER — Encounter (HOSPITAL_COMMUNITY)
Admission: RE | Admit: 2016-05-12 | Discharge: 2016-05-12 | Disposition: A | Discharge status: Home / Self Care | Payer: Medicare Other | Source: Ambulatory Visit | Attending: Gastroenterology | Admitting: Gastroenterology

## 2016-05-12 ENCOUNTER — Encounter (HOSPITAL_COMMUNITY): Payer: Self-pay

## 2016-05-14 DIAGNOSIS — Z794 Long term (current) use of insulin: Secondary | ICD-10-CM | POA: Diagnosis not present

## 2016-05-14 DIAGNOSIS — R112 Nausea with vomiting, unspecified: Secondary | ICD-10-CM | POA: Diagnosis not present

## 2016-05-14 DIAGNOSIS — F329 Major depressive disorder, single episode, unspecified: Secondary | ICD-10-CM | POA: Diagnosis not present

## 2016-05-14 DIAGNOSIS — F209 Schizophrenia, unspecified: Secondary | ICD-10-CM | POA: Diagnosis not present

## 2016-05-14 DIAGNOSIS — I8291 Chronic embolism and thrombosis of unspecified vein: Secondary | ICD-10-CM | POA: Diagnosis not present

## 2016-05-14 DIAGNOSIS — M1990 Unspecified osteoarthritis, unspecified site: Secondary | ICD-10-CM | POA: Diagnosis not present

## 2016-05-14 DIAGNOSIS — E119 Type 2 diabetes mellitus without complications: Secondary | ICD-10-CM | POA: Diagnosis not present

## 2016-05-14 DIAGNOSIS — F419 Anxiety disorder, unspecified: Secondary | ICD-10-CM | POA: Diagnosis not present

## 2016-05-14 DIAGNOSIS — Z5181 Encounter for therapeutic drug level monitoring: Secondary | ICD-10-CM | POA: Diagnosis not present

## 2016-05-14 DIAGNOSIS — Z7901 Long term (current) use of anticoagulants: Secondary | ICD-10-CM | POA: Diagnosis not present

## 2016-05-14 DIAGNOSIS — D1803 Hemangioma of intra-abdominal structures: Secondary | ICD-10-CM | POA: Diagnosis not present

## 2016-05-14 DIAGNOSIS — I69354 Hemiplegia and hemiparesis following cerebral infarction affecting left non-dominant side: Secondary | ICD-10-CM | POA: Diagnosis not present

## 2016-05-15 DIAGNOSIS — I69354 Hemiplegia and hemiparesis following cerebral infarction affecting left non-dominant side: Secondary | ICD-10-CM | POA: Diagnosis not present

## 2016-05-15 DIAGNOSIS — E119 Type 2 diabetes mellitus without complications: Secondary | ICD-10-CM | POA: Diagnosis not present

## 2016-05-15 DIAGNOSIS — R112 Nausea with vomiting, unspecified: Secondary | ICD-10-CM | POA: Diagnosis not present

## 2016-05-15 DIAGNOSIS — Z7901 Long term (current) use of anticoagulants: Secondary | ICD-10-CM | POA: Diagnosis not present

## 2016-05-15 DIAGNOSIS — D1803 Hemangioma of intra-abdominal structures: Secondary | ICD-10-CM | POA: Diagnosis not present

## 2016-05-15 DIAGNOSIS — I8291 Chronic embolism and thrombosis of unspecified vein: Secondary | ICD-10-CM | POA: Diagnosis not present

## 2016-05-15 NOTE — Progress Notes (Signed)
[name] was informed at [facility] and I am faxing the info to her @ [phone].

## 2016-05-15 NOTE — Progress Notes (Signed)
PLEASE CALL PT. HER URIN CULTURE IS NEGATIVE.

## 2016-05-16 ENCOUNTER — Encounter (HOSPITAL_COMMUNITY): Payer: Self-pay

## 2016-05-16 ENCOUNTER — Encounter (HOSPITAL_COMMUNITY)
Admission: RE | Admit: 2016-05-16 | Discharge: 2016-05-16 | Disposition: A | Discharge status: Home / Self Care | Payer: Medicare Other | Source: Ambulatory Visit | Attending: Gastroenterology | Admitting: Gastroenterology

## 2016-05-16 DIAGNOSIS — I69354 Hemiplegia and hemiparesis following cerebral infarction affecting left non-dominant side: Secondary | ICD-10-CM | POA: Diagnosis not present

## 2016-05-16 DIAGNOSIS — E119 Type 2 diabetes mellitus without complications: Secondary | ICD-10-CM | POA: Diagnosis not present

## 2016-05-16 DIAGNOSIS — K219 Gastro-esophageal reflux disease without esophagitis: Secondary | ICD-10-CM | POA: Diagnosis not present

## 2016-05-16 DIAGNOSIS — Z79899 Other long term (current) drug therapy: Secondary | ICD-10-CM | POA: Diagnosis not present

## 2016-05-16 DIAGNOSIS — K2950 Unspecified chronic gastritis without bleeding: Secondary | ICD-10-CM | POA: Diagnosis not present

## 2016-05-16 DIAGNOSIS — R1310 Dysphagia, unspecified: Secondary | ICD-10-CM | POA: Diagnosis not present

## 2016-05-16 DIAGNOSIS — Z7901 Long term (current) use of anticoagulants: Secondary | ICD-10-CM | POA: Diagnosis not present

## 2016-05-16 DIAGNOSIS — Z86718 Personal history of other venous thrombosis and embolism: Secondary | ICD-10-CM | POA: Diagnosis not present

## 2016-05-16 DIAGNOSIS — Z794 Long term (current) use of insulin: Secondary | ICD-10-CM | POA: Diagnosis not present

## 2016-05-16 DIAGNOSIS — F419 Anxiety disorder, unspecified: Secondary | ICD-10-CM | POA: Diagnosis not present

## 2016-05-16 DIAGNOSIS — E1151 Type 2 diabetes mellitus with diabetic peripheral angiopathy without gangrene: Secondary | ICD-10-CM | POA: Diagnosis not present

## 2016-05-16 DIAGNOSIS — I10 Essential (primary) hypertension: Secondary | ICD-10-CM | POA: Diagnosis not present

## 2016-05-16 DIAGNOSIS — F209 Schizophrenia, unspecified: Secondary | ICD-10-CM | POA: Diagnosis not present

## 2016-05-16 DIAGNOSIS — D1803 Hemangioma of intra-abdominal structures: Secondary | ICD-10-CM | POA: Diagnosis not present

## 2016-05-16 DIAGNOSIS — I8291 Chronic embolism and thrombosis of unspecified vein: Secondary | ICD-10-CM | POA: Diagnosis not present

## 2016-05-16 DIAGNOSIS — K449 Diaphragmatic hernia without obstruction or gangrene: Secondary | ICD-10-CM | POA: Diagnosis not present

## 2016-05-16 DIAGNOSIS — R112 Nausea with vomiting, unspecified: Secondary | ICD-10-CM | POA: Diagnosis not present

## 2016-05-16 DIAGNOSIS — K317 Polyp of stomach and duodenum: Secondary | ICD-10-CM | POA: Diagnosis not present

## 2016-05-16 DIAGNOSIS — K5710 Diverticulosis of small intestine without perforation or abscess without bleeding: Secondary | ICD-10-CM | POA: Diagnosis not present

## 2016-05-16 DIAGNOSIS — Z87891 Personal history of nicotine dependence: Secondary | ICD-10-CM | POA: Diagnosis not present

## 2016-05-16 LAB — CBC WITH DIFFERENTIAL/PLATELET
Basophils Absolute: 0 10*3/uL (ref 0.0–0.1)
Basophils Relative: 0 %
Eosinophils Absolute: 0.1 10*3/uL (ref 0.0–0.7)
Eosinophils Relative: 2 %
HCT: 41.3 % (ref 36.0–46.0)
Hemoglobin: 14 g/dL (ref 12.0–15.0)
Lymphocytes Relative: 31 %
Lymphs Abs: 1.9 10*3/uL (ref 0.7–4.0)
MCH: 30.6 pg (ref 26.0–34.0)
MCHC: 33.9 g/dL (ref 30.0–36.0)
MCV: 90.2 fL (ref 78.0–100.0)
Monocytes Absolute: 0.3 10*3/uL (ref 0.1–1.0)
Monocytes Relative: 5 %
Neutro Abs: 3.8 10*3/uL (ref 1.7–7.7)
Neutrophils Relative %: 62 %
Platelets: 262 10*3/uL (ref 150–400)
RBC: 4.58 MIL/uL (ref 3.87–5.11)
RDW: 12.8 % (ref 11.5–15.5)
WBC: 6.2 10*3/uL (ref 4.0–10.5)

## 2016-05-16 LAB — BASIC METABOLIC PANEL
Anion gap: 10 (ref 5–15)
BUN: 9 mg/dL (ref 6–20)
CO2: 26 mmol/L (ref 22–32)
Calcium: 8.9 mg/dL (ref 8.9–10.3)
Chloride: 102 mmol/L (ref 101–111)
Creatinine, Ser: 0.75 mg/dL (ref 0.44–1.00)
GFR calc Af Amer: >60 mL/min (ref >60)
GFR calc non Af Amer: >60 mL/min (ref >60)
Glucose, Bld: 140 mg/dL — ABNORMAL HIGH (ref 65–99)
Potassium: 3.5 mmol/L (ref 3.5–5.1)
Sodium: 138 mmol/L (ref 135–145)

## 2016-05-16 NOTE — Progress Notes (Signed)
Noted  

## 2016-05-17 ENCOUNTER — Ambulatory Visit (HOSPITAL_COMMUNITY): Payer: Medicare Other | Admitting: Anesthesiology

## 2016-05-17 ENCOUNTER — Ambulatory Visit (HOSPITAL_COMMUNITY)
Admission: RE | Admit: 2016-05-17 | Discharge: 2016-05-17 | Disposition: A | Discharge status: Home / Self Care | Payer: Medicare Other | Source: Ambulatory Visit | Attending: Gastroenterology | Admitting: Gastroenterology

## 2016-05-17 ENCOUNTER — Encounter (HOSPITAL_COMMUNITY): Payer: Self-pay | Admitting: *Deleted

## 2016-05-17 ENCOUNTER — Encounter (HOSPITAL_COMMUNITY)
Admission: RE | Disposition: A | Discharge status: Home / Self Care | Payer: Self-pay | Source: Ambulatory Visit | Attending: Gastroenterology

## 2016-05-17 DIAGNOSIS — K2950 Unspecified chronic gastritis without bleeding: Secondary | ICD-10-CM | POA: Diagnosis not present

## 2016-05-17 DIAGNOSIS — E1151 Type 2 diabetes mellitus with diabetic peripheral angiopathy without gangrene: Secondary | ICD-10-CM | POA: Insufficient documentation

## 2016-05-17 DIAGNOSIS — I10 Essential (primary) hypertension: Secondary | ICD-10-CM | POA: Insufficient documentation

## 2016-05-17 DIAGNOSIS — K5710 Diverticulosis of small intestine without perforation or abscess without bleeding: Secondary | ICD-10-CM | POA: Diagnosis not present

## 2016-05-17 DIAGNOSIS — K219 Gastro-esophageal reflux disease without esophagitis: Secondary | ICD-10-CM | POA: Diagnosis not present

## 2016-05-17 DIAGNOSIS — F209 Schizophrenia, unspecified: Secondary | ICD-10-CM | POA: Insufficient documentation

## 2016-05-17 DIAGNOSIS — K222 Esophageal obstruction: Secondary | ICD-10-CM

## 2016-05-17 DIAGNOSIS — I69354 Hemiplegia and hemiparesis following cerebral infarction affecting left non-dominant side: Secondary | ICD-10-CM | POA: Insufficient documentation

## 2016-05-17 DIAGNOSIS — K2930 Chronic superficial gastritis without bleeding: Secondary | ICD-10-CM

## 2016-05-17 DIAGNOSIS — R112 Nausea with vomiting, unspecified: Secondary | ICD-10-CM | POA: Diagnosis not present

## 2016-05-17 DIAGNOSIS — R1310 Dysphagia, unspecified: Secondary | ICD-10-CM

## 2016-05-17 DIAGNOSIS — K317 Polyp of stomach and duodenum: Secondary | ICD-10-CM | POA: Insufficient documentation

## 2016-05-17 DIAGNOSIS — Z87891 Personal history of nicotine dependence: Secondary | ICD-10-CM | POA: Insufficient documentation

## 2016-05-17 DIAGNOSIS — Z86718 Personal history of other venous thrombosis and embolism: Secondary | ICD-10-CM | POA: Insufficient documentation

## 2016-05-17 DIAGNOSIS — Z79899 Other long term (current) drug therapy: Secondary | ICD-10-CM | POA: Insufficient documentation

## 2016-05-17 DIAGNOSIS — Z794 Long term (current) use of insulin: Secondary | ICD-10-CM | POA: Insufficient documentation

## 2016-05-17 DIAGNOSIS — Z7901 Long term (current) use of anticoagulants: Secondary | ICD-10-CM | POA: Insufficient documentation

## 2016-05-17 DIAGNOSIS — K449 Diaphragmatic hernia without obstruction or gangrene: Secondary | ICD-10-CM | POA: Diagnosis not present

## 2016-05-17 DIAGNOSIS — F419 Anxiety disorder, unspecified: Secondary | ICD-10-CM | POA: Insufficient documentation

## 2016-05-17 HISTORY — PX: SAVORY DILATION: SHX5439

## 2016-05-17 HISTORY — PX: ESOPHAGOGASTRODUODENOSCOPY (EGD) WITH PROPOFOL: SHX5813

## 2016-05-17 HISTORY — PX: BIOPSY: SHX5522

## 2016-05-17 LAB — GLUCOSE, CAPILLARY: Glucose-Capillary: 168 mg/dL — ABNORMAL HIGH (ref 65–99)

## 2016-05-17 SURGERY — ESOPHAGOGASTRODUODENOSCOPY (EGD) WITH PROPOFOL
Anesthesia: Monitor Anesthesia Care

## 2016-05-17 MED ORDER — MIDAZOLAM HCL 2 MG/2ML IJ SOLN
INTRAMUSCULAR | Status: AC
  Filled 2016-05-17: qty 2

## 2016-05-17 MED ORDER — ENOXAPARIN SODIUM 100 MG/ML ~~LOC~~ SOLN: 100.0000 mg | SUBCUTANEOUS | 1 refills | Status: DC

## 2016-05-17 MED ORDER — LACTATED RINGERS IV SOLN
INTRAVENOUS | Status: DC
  Administered 2016-05-17: 14:00:00 via INTRAVENOUS

## 2016-05-17 MED ORDER — FENTANYL CITRATE (PF) 100 MCG/2ML IJ SOLN
INTRAMUSCULAR | Status: AC
  Filled 2016-05-17: qty 2

## 2016-05-17 MED ORDER — FENTANYL CITRATE (PF) 100 MCG/2ML IJ SOLN
25.0000 ug | Freq: Once | INTRAMUSCULAR | Status: AC
  Administered 2016-05-17: 25 ug via INTRAVENOUS

## 2016-05-17 MED ORDER — MIDAZOLAM HCL 2 MG/2ML IJ SOLN
1.0000 mg | INTRAMUSCULAR | Status: AC
  Administered 2016-05-17: 2 mg via INTRAVENOUS

## 2016-05-17 MED ORDER — LACTATED RINGERS IV SOLN
INTRAVENOUS | Status: DC | PRN
  Administered 2016-05-17: 13:00:00 via INTRAVENOUS

## 2016-05-17 MED ORDER — PROPOFOL 10 MG/ML IV BOLUS
INTRAVENOUS | Status: DC | PRN
  Administered 2016-05-17 (×3): 10 mg via INTRAVENOUS

## 2016-05-17 MED ORDER — LIDOCAINE VISCOUS 2 % MT SOLN
5.0000 mL | Freq: Once | OROMUCOSAL | Status: AC
Start: 2016-05-17 — End: 2016-05-17
  Administered 2016-05-17: 5 mL via OROMUCOSAL

## 2016-05-17 MED ORDER — CHLORHEXIDINE GLUCONATE CLOTH 2 % EX PADS: 6.0000 | MEDICATED_PAD | Freq: Once | CUTANEOUS | Status: DC

## 2016-05-17 MED ORDER — PROPOFOL 500 MG/50ML IV EMUL
INTRAVENOUS | Status: DC | PRN
  Administered 2016-05-17: 125 ug/kg/min via INTRAVENOUS

## 2016-05-17 MED ORDER — LIDOCAINE VISCOUS 2 % MT SOLN
OROMUCOSAL | Status: AC
  Filled 2016-05-17: qty 15

## 2016-05-17 MED ORDER — PROPOFOL 10 MG/ML IV BOLUS
INTRAVENOUS | Status: AC
  Filled 2016-05-17: qty 40

## 2016-05-17 MED ORDER — LIDOCAINE HCL (PF) 1 % IJ SOLN
INTRAMUSCULAR | Status: AC
  Filled 2016-05-17: qty 5

## 2016-05-17 MED ORDER — MINERAL OIL PO OIL
TOPICAL_OIL | ORAL | Status: AC
  Filled 2016-05-17: qty 30

## 2016-05-17 NOTE — Discharge Instructions (Signed)
I STRETCHED your esophagus. You have a small hiatal hernia. You have gastritis & A DUODENAL DIVERTICULUM. I biopsied your stomach.   RESUME LOVENOX TODAY APR 25.  RESUME COUMADIN APR 26.  CONTINUE PRILOSEC EVERY MORNING.  YOUR BIOPSY RESULTS WILL BE AVAILABLE IN MY CHART AFTER APR 27 AND MY OFFICE WILL CONTACT YOU IN 10-14 DAYS WITH YOUR RESULTS.   FOLLOW UP APPT IN 3 MONTHS.    UPPER ENDOSCOPY AFTER CARE Read the instructions outlined below and refer to this sheet in the next week. These discharge instructions provide you with general information on caring for yourself after you leave the hospital. While your treatment has been planned according to the most current medical practices available, unavoidable complications occasionally occur. If you have any problems or questions after discharge, call DR. Biagio Snelson, [phone].  ACTIVITY  You may resume your regular activity, but move at a slower pace for the next 24 hours.   Take frequent rest periods for the next 24 hours.   Walking will help get rid of the air and reduce the bloated feeling in your belly (abdomen).   No driving for 24 hours (because of the medicine (anesthesia) used during the test).   You may shower.   Do not sign any important legal documents or operate any machinery for 24 hours (because of the anesthesia used during the test).    NUTRITION  Drink plenty of fluids.   You may resume your normal diet as instructed by your doctor.   Begin with a light meal and progress to your normal diet. Heavy or fried foods are harder to digest and may make you feel sick to your stomach (nauseated).   Avoid alcoholic beverages for 24 hours or as instructed.    MEDICATIONS  You may resume your normal medications.   WHAT YOU CAN EXPECT TODAY  Some feelings of bloating in the abdomen.   Passage of more gas than usual.    IF YOU HAD A BIOPSY TAKEN DURING THE UPPER ENDOSCOPY:  Eat a soft diet IF YOU HAVE NAUSEA,  BLOATING, ABDOMINAL PAIN, OR VOMITING.    FINDING OUT THE RESULTS OF YOUR TEST Not all test results are available during your visit. DR. Oneida Alar WILL CALL YOU WITHIN 14 DAYS OF YOUR PROCEDUE WITH YOUR RESULTS. Do not assume everything is normal if you have not heard from DR. Khylee Algeo, CALL HER OFFICE AT [phone].  SEEK IMMEDIATE MEDICAL ATTENTION AND CALL THE OFFICE: [phone] IF:  You have more than a spotting of blood in your stool.   Your belly is swollen (abdominal distention).   You are nauseated or vomiting.   You have a temperature over 101F.   You have abdominal pain or discomfort that is severe or gets worse throughout the day.   Gastritis  Gastritis is an inflammation (the body's way of reacting to injury and/or infection) of the stomach. It is often caused by bacterial (germ) infections. It can also be caused BY ASPIRIN, BC/GOODY POWDER'S, (IBUPROFEN) MOTRIN, OR ALEVE (NAPROXEN), chemicals (including alcohol), SPICY FOODS, and medications. This illness may be associated with generalized malaise (feeling tired, not well), UPPER ABDOMINAL STOMACH cramps, and fever. One common bacterial cause of gastritis is an organism known as H. Pylori. This can be treated with antibiotics.   ESOPHAGEAL STRICTURE  Esophageal strictures can be caused by stomach acid backing up into the tube that carries food from the mouth down to the stomach (lower esophagus).  TREATMENT There are a number of medicines  used to treat reflux/stricture, including: Antacids.  ZANTAC Proton-pump inhibitors: OMEPRAZOLE  HOME CARE INSTRUCTIONS Eat 2-3 hours before going to bed.  Try to reach and maintain a healthy weight.  Do not eat just a few very large meals. Instead, eat 4 TO 6 smaller meals throughout the day.  Try to identify foods and beverages that make your symptoms worse, and avoid these.  Avoid tight clothing.  Do not exercise right after eating.

## 2016-05-17 NOTE — Op Note (Signed)
[facility] Patient Name: [name] Procedure Date: 05/17/2016 1:36 PM MRN: [id number] Date of Birth: 10-Aug-1945 Attending MD: Barney Drain , MD CSN: [id number] Age: 71 Admit Type: Outpatient Procedure:                Upper GI endoscopy WITH COLD FORCEPS                            BIOPSY/ESOPHAGEAL DILATION Indications:              Dysphagia, Nausea with vomiting Providers:                Barney Drain, MD, Janeece Riggers, RN, Bonnetta Barry,                            Technician Referring MD:             [name] MD, MD Medicines:                Propofol per Anesthesia Complications:            No immediate complications. Estimated Blood Loss:     Estimated blood loss was minimal. Procedure:                Pre-Anesthesia Assessment:                           - Prior to the procedure, a History and Physical                            was performed, and patient medications and                            allergies were reviewed. The patient's tolerance of                            previous anesthesia was also reviewed. The risks                            and benefits of the procedure and the sedation                            options and risks were discussed with the patient.                            All questions were answered, and informed consent                            was obtained. Prior Anticoagulants: The patient has                            taken Lovenox (enoxaparin), last dose was 1 day                            prior to procedure. ASA Grade Assessment: III - A  patient with severe systemic disease. After                            reviewing the risks and benefits, the patient was                            deemed in satisfactory condition to undergo the                            procedure. After obtaining informed consent, the                            endoscope was passed under direct vision.   Throughout the procedure, the patient's blood                            pressure, pulse, and oxygen saturations were                            monitored continuously. The EG-[id number] ([id number])                            scope was introduced through the mouth, and                            advanced to the second part of duodenum. The upper                            GI endoscopy was accomplished without difficulty.                            The patient tolerated the procedure well. Scope In: 2:04:41 PM Scope Out: 2:22:36 PM Total Procedure Duration: 0 hours 17 minutes 55 seconds  Findings:      One moderate (circumferential scarring or stenosis; an endoscope may       pass) benign-appearing, intrinsic stenosis was found. This measured 1.3       cm (inner diameter) and was traversed. A guidewire was placed and the       scope was withdrawn. Dilation was performed with a Savary dilator with       mild resistance at 12.8 mm, 14 mm, 15 mm and 16 mm. Estimated blood loss       was minimal.      A small hiatal hernia was present.      A few small sessile polyps with stigmata of recent bleeding were found       in the gastric body. This was biopsied with a cold forceps for histology.      Patchy moderate inflammation characterized by congestion (edema) and       erythema was found in the gastric antrum. Biopsies were taken with a       cold forceps for Helicobacter pylori testing. Estimated blood loss was       minimal.      A medium non-bleeding diverticulum was found in the second portion of       the duodenum.      The duodenal bulb was normal. Impression:               -  Benign-appearing esophageal stenosis.                           - Small hiatal hernia.                           - A few gastric polyps. .                           - Chronic gastritis.                           - Non-bleeding duodenal diverticulum. Moderate Sedation:      Per Anesthesia Care Recommendation:            - Resume previous diet.                           - Continue present medications. RESUME LOVENOX APR                            25. RESUME COUMADIN APR 26.                           - Await pathology results.                           - Return to my office in 3 months.                           - Patient has a contact number available for                            emergencies. The signs and symptoms of potential                            delayed complications were discussed with the                            patient. Return to normal activities tomorrow.                            Written discharge instructions were provided to the                            patient. Procedure Code(s):        --- Professional ---                           [phone], Esophagogastroduodenoscopy, flexible,                            transoral; with insertion of guide wire followed by                            passage of dilator(s) through esophagus over guide  wire                           [id number], Esophagogastroduodenoscopy, flexible,                            transoral; with biopsy, single or multiple Diagnosis Code(s):        --- Professional ---                           K22.2, Esophageal obstruction                           K44.9, Diaphragmatic hernia without obstruction or                            gangrene                           K31.7, Polyp of stomach and duodenum                           K29.50, Unspecified chronic gastritis without                            bleeding                           R13.10, Dysphagia, unspecified                           R11.2, Nausea with vomiting, unspecified                           K57.10, Diverticulosis of small intestine without                            perforation or abscess without bleeding CPT copyright 2016 American Medical Association. All rights reserved. The codes documented in this report are preliminary and upon coder  review may  be revised to meet current compliance requirements. Barney Drain, MD Barney Drain, MD 05/17/2016 2:36:27 PM This report has been signed electronically. Number of Addenda: 0

## 2016-05-17 NOTE — H&P (Signed)
Primary Care Physician:  [name], MD Primary Gastroenterologist:  Dr. Oneida Alar  Pre-Procedure History & Physical: HPI:  [name] is a 71 y.o. female here for DYSPHAGIA/NAUSEA/VOMITING.  Past Medical History:  Diagnosis Date  . Anxiety   . Arthritis   . Depression    History of psychosis and previous suicide attempt  . DVT, lower extremity, recurrent (HCC)    Long-term Coumadin per Dr. Legrand Rams  . Essential hypertension   . GERD (gastroesophageal reflux disease)   . Hemiplegia ([place]) 2010   Left side  . History of stroke    Acute infarct and right cerebral white matter small vessel disease 12/10  . Leg DVT (deep venous thromboembolism), acute ([place]) 2006  . Schizophrenia ([place])   . Stroke [facility])    left sided weakness  . Type 2 diabetes mellitus ([place])     Past Surgical History:  Procedure Laterality Date  . BACK SURGERY    . BIOPSY N/A 11/24/2014   Procedure: BIOPSY;  Surgeon: Danie Binder, MD;  Location: [facility];  Service: Endoscopy;  Laterality: N/A;  . COLONOSCOPY WITH PROPOFOL N/A 11/24/2014   Dr. Rudie Meyer polyps removed/moderate sized internal hemorrhoids, tubular adenomas. Next surveillance in 3 years  . ESOPHAGOGASTRODUODENOSCOPY (EGD) WITH PROPOFOL N/A 11/24/2014   Dr. Clayburn Pert HH/patent stricture at the gastroesophageal junction, mild non-erosive gastritis, path negative for H.pylori or celiac sprue  . GIVENS CAPSULE STUDY N/A 12/11/2014   MULTILPLE EROSION IN the stomach WITH ACTIVE OOZING. OCCASIONAL EROSIONS AND RARE ULCER SEEN IN PROXIMAL SMALL BOWEL . No masses or AVMs SEEN. NO OLD BLOOD OR FRESH BLOOD SEEN.   . POLYPECTOMY N/A 11/24/2014   Procedure: POLYPECTOMY;  Surgeon: Danie Binder, MD;  Location: [facility];  Service: Endoscopy;  Laterality: N/A;    Prior to Admission medications   Medication Sig Start Date End Date Taking? Authorizing Provider  acetaminophen (TYLENOL) 325 MG tablet Take 650 mg by mouth every 6 (six) hours as needed. For fever > 101     Yes Historical Provider, MD  ARIPiprazole (ABILIFY) 2 MG tablet Take 2 mg by mouth daily.  08/31/15  Yes Historical Provider, MD  calcium carbonate (TUMS - DOSED IN MG ELEMENTAL CALCIUM) 500 MG chewable tablet Chew 1 tablet by mouth 4 (four) times daily as needed for indigestion or heartburn.    Yes Historical Provider, MD  clonazePAM (KLONOPIN) 0.5 MG tablet Take 0.5 mg by mouth 3 (three) times daily.  10/08/14  Yes Historical Provider, MD  docusate sodium (COLACE) 100 MG capsule Take 100 mg by mouth daily as needed (for constipation).    Yes Historical Provider, MD  DULoxetine (CYMBALTA) 60 MG capsule Take 120 mg by mouth daily.    Yes Historical Provider, MD  enalapril (VASOTEC) 2.5 MG tablet Take 1.25 mg by mouth daily.  08/31/15  Yes Historical Provider, MD  HYDROcodone-acetaminophen (NORCO) 5-325 MG per tablet Take 1 tablet by mouth 3 (three) times daily.    Yes Historical Provider, MD  Insulin Detemir (LEVEMIR FLEXTOUCH) 100 UNIT/ML Pen INJECT 80 UNITS SUBCUTANEOUSLY AT BEDTIME. Patient taking differently: Inject 80 Units into the skin at bedtime.  03/02/16  Yes Cassandria Anger, MD  iron polysaccharides (NIFEREX) 150 MG capsule Take 1 capsule (150 mg total) by mouth daily. 04/10/16  Yes Manon Hilding Kefalas, PA-C  latanoprost (XALATAN) 0.005 % ophthalmic solution Place 1 drop into both eyes at bedtime.   Yes Historical Provider, MD  loperamide (IMODIUM) 2 MG capsule Take 4 mg by mouth  daily as needed for diarrhea or loose stools. Take 2 capsules initially then take 2 after each loose stool per [facility]   Yes Historical Provider, MD  metoCLOPramide (REGLAN) 5 MG tablet Take 1 tablet (5 mg total) by mouth every 8 (eight) hours as needed for nausea or vomiting (or bloating). 03/11/16  Yes Francine Graven, DO  metoprolol succinate (TOPROL-XL) 25 MG 24 hr tablet Take 12.5 mg by mouth 2 (two) times daily.    Yes Historical Provider, MD  mirtazapine (REMERON) 30 MG tablet Take 30 mg by mouth at bedtime.      Yes Historical Provider, MD  naloxegol oxalate (MOVANTIK) 12.5 MG TABS tablet Take 1 tablet (12.5 mg total) by mouth daily. 03/22/16  Yes Keiran Gaffey L Nezzie Manera, MD  NOVOLOG FLEXPEN 100 UNIT/ML FlexPen INJECT 25 UNITS SUBCUTANEOUSLY BEFORE MEALS IF BS IS BETWEEN 90-150. 150-200 ADD 1 UNIT , 201-250 ADD 2 UNITS 04/17/16  Yes Cassandria Anger, MD  NOVOLOG FLEXPEN 100 UNIT/ML FlexPen SS: 251-300 ADD 3 UNITS; 301-350 ADD 4 UNITS; 351-400 ADD 5 UNITS; 401-450 ADD 6 UNITS AND CALL DOCTOR. 04/17/16  Yes Cassandria Anger, MD  omeprazole (PRILOSEC) 40 MG capsule Take 40 mg by mouth daily.   Yes Historical Provider, MD  ondansetron (ZOFRAN) 4 MG tablet Take 1 tablet (4 mg total) by mouth 4 (four) times daily -  before meals and at bedtime. 04/20/16  Yes Annitta Needs, NP  polyvinyl alcohol (ARTIFICIAL TEARS) 1.4 % ophthalmic solution Place 1 drop into both eyes 3 (three) times daily.   Yes Historical Provider, MD  promethazine (PHENERGAN) 12.5 MG tablet TAKE 1 TABLET BY MOUTH EVERY 6 HOURS AS NEEDED FOR NAUSEA OR VOMITING. 04/12/16  Yes Mahala Menghini, PA-C  risperiDONE (RISPERDAL) 2 MG tablet Take 2 mg by mouth 2 (two) times daily.   Yes Historical Provider, MD  simvastatin (ZOCOR) 20 MG tablet Take 20 mg by mouth at bedtime.  03/31/16  Yes Historical Provider, MD  solifenacin (VESICARE) 5 MG tablet Take 5 mg by mouth daily.   Yes Historical Provider, MD  tiZANidine (ZANAFLEX) 2 MG tablet Take 2 mg by mouth daily.   Yes Historical Provider, MD  Vitamin D, Ergocalciferol, (DRISDOL) 50000 UNITS CAPS capsule Take 50,000 Units by mouth every Friday.    Yes Historical Provider, MD  warfarin (COUMADIN) 5 MG tablet Take as directed by [facility] Patient taking differently: Take 2.5-5 mg by mouth See admin instructions. Take 0.5 tablet (2.5 MG) by mouth on Monday, Wednesday, & Friday Take 1 tablet (5 MG) by mouth on Sunday, Tuesday, Thursday, and Saturday. 03/31/16  Yes Satira Sark, MD  zolpidem (AMBIEN) 10 MG  tablet Take 10 mg by mouth at bedtime.   Yes Historical Provider, MD  enoxaparin (LOVENOX) 100 MG/ML injection Inject 1 mL (100 mg total) into the skin daily. 05/14/16 05/22/16  Herminio Commons, MD    Allergies as of 04/28/2016 - Review Complete 04/20/2016  Allergen Reaction Noted  . Sulfa antibiotics Rash 02/23/2010  . Sulfonamide derivatives Rash     Family History  Problem Relation Age of Onset  . Hypertension Mother   . Colon cancer Neg Hx     Social History   Social History  . Marital status: Widowed    Spouse name: N/A  . Number of children: N/A  . Years of education: N/A   Occupational History  . Not on file.   Social History Main Topics  . Smoking status: Former Smoker  Packs/day: 0.25    Years: 20.00    Types: Cigarettes    Quit date: 01/24/1995  . Smokeless tobacco: Never Used  . Alcohol use No  . Drug use: No  . Sexual activity: No   Other Topics Concern  . Not on file   Social History Narrative  . No narrative on file    Review of Systems: See HPI, otherwise negative ROS   Physical Exam: BP 126/75   Pulse 89   Temp 97.7 F (36.5 C) (Oral)   Resp (!) 28   SpO2 95%  General:   Alert,  pleasant and cooperative in NAD Head:  Normocephalic and atraumatic. Neck:  Supple; Lungs:  Clear throughout to auscultation.    Heart:  Regular rate and rhythm. Abdomen:  Soft, nontender and nondistended. Normal bowel sounds, without guarding, and without rebound.   Neurologic:  Alert and  oriented x4;  grossly normal neurologically.  Impression/Plan:     DYSPHAGIA/NAUSEA/VOMITING  PLAN:  EGD/DIL TODAY. DISCUSSED PROCEDURE, BENEFITS, & RISKS: < 1% chance of medication reaction, bleeding, perforation, or rupture of spleen/liver.

## 2016-05-17 NOTE — Transfer of Care (Signed)
Immediate Anesthesia Transfer of Care Note  Patient: [name]  Procedure(s) Performed: Procedure(s) with comments: ESOPHAGOGASTRODUODENOSCOPY (EGD) WITH PROPOFOL (N/A) - 12:45pm SAVORY DILATION (N/A) BIOPSY - gastric biopsy  Patient Location: PACU  Anesthesia Type:MAC  Level of Consciousness: awake  Airway & Oxygen Therapy: Patient Spontanous Breathing  Post-op Assessment: Report given to RN  Post vital signs: Reviewed  Last Vitals:  Vitals:   05/17/16 1330 05/17/16 1335  BP:    Pulse:    Resp: (!) 36 13  Temp:      Last Pain:  Vitals:   05/17/16 1205  TempSrc: Oral  PainSc: 5          Complications: No apparent anesthesia complications

## 2016-05-17 NOTE — Anesthesia Preprocedure Evaluation (Signed)
Anesthesia Evaluation  Patient identified by MRN, date of birth, ID band Patient awake    Reviewed: Allergy & Precautions, NPO status , Patient's Chart, lab work & pertinent test results  Airway Mallampati: III  TM Distance: <3 FB     Dental  (+) Edentulous Upper, Edentulous Lower   Pulmonary neg pulmonary ROS, former smoker,    breath sounds clear to auscultation       Cardiovascular hypertension, Pt. on medications + Peripheral Vascular Disease and + DVT   Rhythm:Regular Rate:Normal     Neuro/Psych PSYCHIATRIC DISORDERS Anxiety Depression Schizophrenia CVA, Residual Symptoms    GI/Hepatic GERD  Controlled,  Endo/Other  diabetes, Type 2, Oral Hypoglycemic Agents  Renal/GU      Musculoskeletal   Abdominal   Peds  Hematology  (+) anemia ,   Anesthesia Other Findings   Reproductive/Obstetrics                             Anesthesia Physical Anesthesia Plan  ASA: III  Anesthesia Plan: MAC   Post-op Pain Management:    Induction: Intravenous  Airway Management Planned: Simple Face Mask  Additional Equipment:   Intra-op Plan:   Post-operative Plan:   Informed Consent: I have reviewed the patients History and Physical, chart, labs and discussed the procedure including the risks, benefits and alternatives for the proposed anesthesia with the patient or authorized representative who has indicated his/her understanding and acceptance.     Plan Discussed with:   Anesthesia Plan Comments:         Anesthesia Quick Evaluation

## 2016-05-18 ENCOUNTER — Telehealth: Payer: Self-pay

## 2016-05-18 DIAGNOSIS — D1803 Hemangioma of intra-abdominal structures: Secondary | ICD-10-CM | POA: Diagnosis not present

## 2016-05-18 DIAGNOSIS — I8291 Chronic embolism and thrombosis of unspecified vein: Secondary | ICD-10-CM | POA: Diagnosis not present

## 2016-05-18 DIAGNOSIS — Z7901 Long term (current) use of anticoagulants: Secondary | ICD-10-CM | POA: Diagnosis not present

## 2016-05-18 DIAGNOSIS — E119 Type 2 diabetes mellitus without complications: Secondary | ICD-10-CM | POA: Diagnosis not present

## 2016-05-18 DIAGNOSIS — R112 Nausea with vomiting, unspecified: Secondary | ICD-10-CM | POA: Diagnosis not present

## 2016-05-18 DIAGNOSIS — I69354 Hemiplegia and hemiparesis following cerebral infarction affecting left non-dominant side: Secondary | ICD-10-CM | POA: Diagnosis not present

## 2016-05-18 NOTE — Telephone Encounter (Signed)
T/C from Butch Penny, nurse @ [facility], asking about pt's coumadin. She said discharge paper's said to resume Lovenox on 05/17/2016 and to resume coumadin 05/18/2016. She said it did not say what strength of coumadin to resume and I told her it should be what she was last taking, because Dr. Oneida Alar did not specify on discharge orders. She said that is fine, and that pt does have an appt on Monday to check PT/INR. I told her that I would let Dr. Oneida Alar know.

## 2016-05-18 NOTE — Telephone Encounter (Signed)
REVIEWED. AGREE. NO ADDITIONAL RECOMMENDATIONS. 

## 2016-05-18 NOTE — Anesthesia Postprocedure Evaluation (Signed)
Anesthesia Post Note  Patient: [name]  Procedure(s) Performed: Procedure(s) (LRB): ESOPHAGOGASTRODUODENOSCOPY (EGD) WITH PROPOFOL (N/A) SAVORY DILATION (N/A) BIOPSY  Patient location during evaluation: PACU Anesthesia Type: MAC Level of consciousness: awake Pain management: satisfactory to patient Vital Signs Assessment: post-procedure vital signs reviewed and stable Respiratory status: spontaneous breathing Cardiovascular status: stable Anesthetic complications: no Comments: Late entry. Sherrill Raring CRNa     Last Vitals:  Vitals:   05/17/16 1429 05/17/16 1451  BP: 121/74   Pulse:  82  Resp: 20 16  Temp: 36.6 C 36.4 C    Last Pain:  Vitals:   05/17/16 1451  TempSrc: Oral  PainSc: 2                  Eydie Wormley

## 2016-05-19 DIAGNOSIS — E119 Type 2 diabetes mellitus without complications: Secondary | ICD-10-CM | POA: Diagnosis not present

## 2016-05-19 DIAGNOSIS — Z7901 Long term (current) use of anticoagulants: Secondary | ICD-10-CM | POA: Diagnosis not present

## 2016-05-19 DIAGNOSIS — R112 Nausea with vomiting, unspecified: Secondary | ICD-10-CM | POA: Diagnosis not present

## 2016-05-19 DIAGNOSIS — D1803 Hemangioma of intra-abdominal structures: Secondary | ICD-10-CM | POA: Diagnosis not present

## 2016-05-19 DIAGNOSIS — I8291 Chronic embolism and thrombosis of unspecified vein: Secondary | ICD-10-CM | POA: Diagnosis not present

## 2016-05-19 DIAGNOSIS — I69354 Hemiplegia and hemiparesis following cerebral infarction affecting left non-dominant side: Secondary | ICD-10-CM | POA: Diagnosis not present

## 2016-05-20 DIAGNOSIS — R112 Nausea with vomiting, unspecified: Secondary | ICD-10-CM | POA: Diagnosis not present

## 2016-05-20 DIAGNOSIS — I8291 Chronic embolism and thrombosis of unspecified vein: Secondary | ICD-10-CM | POA: Diagnosis not present

## 2016-05-20 DIAGNOSIS — D1803 Hemangioma of intra-abdominal structures: Secondary | ICD-10-CM | POA: Diagnosis not present

## 2016-05-20 DIAGNOSIS — I69354 Hemiplegia and hemiparesis following cerebral infarction affecting left non-dominant side: Secondary | ICD-10-CM | POA: Diagnosis not present

## 2016-05-20 DIAGNOSIS — Z7901 Long term (current) use of anticoagulants: Secondary | ICD-10-CM | POA: Diagnosis not present

## 2016-05-20 DIAGNOSIS — E119 Type 2 diabetes mellitus without complications: Secondary | ICD-10-CM | POA: Diagnosis not present

## 2016-05-21 DIAGNOSIS — I69354 Hemiplegia and hemiparesis following cerebral infarction affecting left non-dominant side: Secondary | ICD-10-CM | POA: Diagnosis not present

## 2016-05-21 DIAGNOSIS — D1803 Hemangioma of intra-abdominal structures: Secondary | ICD-10-CM | POA: Diagnosis not present

## 2016-05-21 DIAGNOSIS — R112 Nausea with vomiting, unspecified: Secondary | ICD-10-CM | POA: Diagnosis not present

## 2016-05-21 DIAGNOSIS — E119 Type 2 diabetes mellitus without complications: Secondary | ICD-10-CM | POA: Diagnosis not present

## 2016-05-21 DIAGNOSIS — I8291 Chronic embolism and thrombosis of unspecified vein: Secondary | ICD-10-CM | POA: Diagnosis not present

## 2016-05-21 DIAGNOSIS — Z7901 Long term (current) use of anticoagulants: Secondary | ICD-10-CM | POA: Diagnosis not present

## 2016-05-22 ENCOUNTER — Encounter (HOSPITAL_COMMUNITY): Payer: Self-pay | Admitting: Gastroenterology

## 2016-05-22 ENCOUNTER — Telehealth: Payer: Self-pay

## 2016-05-22 ENCOUNTER — Ambulatory Visit (INDEPENDENT_AMBULATORY_CARE_PROVIDER_SITE_OTHER): Payer: Medicare Other | Admitting: *Deleted

## 2016-05-22 DIAGNOSIS — Z8679 Personal history of other diseases of the circulatory system: Secondary | ICD-10-CM

## 2016-05-22 DIAGNOSIS — I8291 Chronic embolism and thrombosis of unspecified vein: Secondary | ICD-10-CM | POA: Diagnosis not present

## 2016-05-22 DIAGNOSIS — R112 Nausea with vomiting, unspecified: Secondary | ICD-10-CM | POA: Diagnosis not present

## 2016-05-22 DIAGNOSIS — Z7901 Long term (current) use of anticoagulants: Secondary | ICD-10-CM | POA: Diagnosis not present

## 2016-05-22 DIAGNOSIS — I69354 Hemiplegia and hemiparesis following cerebral infarction affecting left non-dominant side: Secondary | ICD-10-CM | POA: Diagnosis not present

## 2016-05-22 DIAGNOSIS — Z5181 Encounter for therapeutic drug level monitoring: Secondary | ICD-10-CM

## 2016-05-22 DIAGNOSIS — D1803 Hemangioma of intra-abdominal structures: Secondary | ICD-10-CM | POA: Diagnosis not present

## 2016-05-22 DIAGNOSIS — E119 Type 2 diabetes mellitus without complications: Secondary | ICD-10-CM | POA: Diagnosis not present

## 2016-05-22 LAB — POCT INR: INR: 1.9

## 2016-05-22 MED ORDER — HYDROCORTISONE 2.5 % RE CREA: 1.0000 "application " | TOPICAL_CREAM | Freq: Two times a day (BID) | RECTAL | 1 refills | Status: DC

## 2016-05-22 MED ORDER — POLYETHYLENE GLYCOL 3350 17 GM/SCOOP PO POWD: ORAL | 1 refills | Status: DC

## 2016-05-22 NOTE — Telephone Encounter (Signed)
She had her last colonoscopy in 2016, and she does have a history of hemorrhoids. Likely benign in setting of possible constipation, straining. Need to ensure she avoids constipation. May take Miralax 1 capful daily as needed for constipation, monitor for loose stools. I have sent to pharmacy.  I have also sent in anusol to take BID for 7 days per rectum. Keep follow-up for July but call us if any further overt bleeding that is significant and will need to be seen sooner.

## 2016-05-22 NOTE — Telephone Encounter (Signed)
I received a phone call from [place], said Butch Penny @ [facility] wants [place] to cancel the order for the Miralax. I told her I will have to find out from Roseanne Kaufman, NP. I called Butch Penny to discuss and she was unavailable and I left message that I need a return call right away.

## 2016-05-22 NOTE — Telephone Encounter (Signed)
I called and informed the pharmacy to cancel order for the Miralax. Faxing this to Butch Penny at [facility]. Butch Penny, please call and schedule pt a follow up appointment before July. Thanks and all me if you have questions. Faxing this to Butch Penny now.

## 2016-05-22 NOTE — Telephone Encounter (Signed)
Ok, that's fine. Probably need to get her in for a visit before July. I am cancelling Miralax.

## 2016-05-22 NOTE — Telephone Encounter (Signed)
I called and informed Sarah, [facility]. I also called Butch Penny @ [facility] and informed her and I'm faxing the recommendations to her.

## 2016-05-22 NOTE — Telephone Encounter (Signed)
Sarah from [facility] called- she was at [facility]. She said the pt was doing good, vitals are normal, INR was 1.9, no c/o stomach pain but her last bm was on Saturday and she saw blood in her stool x 1. The patient felt it was due to hemorrhoids, she did not show the staff at [facility]. The nurse said she wasn't sure what to do. Told her that I would let Vicente Males know and if we needed to do anything we would call her back. Her number is [phone].   Routing to [employer].

## 2016-05-22 NOTE — Telephone Encounter (Signed)
Butch Penny returned call and asked could we d/c the order for Miralax. She said pt has diarrhea almost daily and does not need Miralax. Please advise!

## 2016-05-23 ENCOUNTER — Encounter (HOSPITAL_COMMUNITY): Payer: Self-pay

## 2016-05-23 ENCOUNTER — Emergency Department (HOSPITAL_COMMUNITY): Payer: Medicare Other

## 2016-05-23 ENCOUNTER — Telehealth: Payer: Self-pay

## 2016-05-23 ENCOUNTER — Other Ambulatory Visit: Payer: Self-pay

## 2016-05-23 ENCOUNTER — Emergency Department (HOSPITAL_COMMUNITY)
Admission: EM | Admit: 2016-05-23 | Discharge: 2016-05-23 | Disposition: A | Discharge status: Home / Self Care | Payer: Medicare Other | Attending: Emergency Medicine | Admitting: Emergency Medicine

## 2016-05-23 DIAGNOSIS — Z794 Long term (current) use of insulin: Secondary | ICD-10-CM | POA: Insufficient documentation

## 2016-05-23 DIAGNOSIS — E860 Dehydration: Secondary | ICD-10-CM | POA: Diagnosis not present

## 2016-05-23 DIAGNOSIS — Z5181 Encounter for therapeutic drug level monitoring: Secondary | ICD-10-CM | POA: Diagnosis not present

## 2016-05-23 DIAGNOSIS — R42 Dizziness and giddiness: Secondary | ICD-10-CM | POA: Diagnosis present

## 2016-05-23 DIAGNOSIS — Z79899 Other long term (current) drug therapy: Secondary | ICD-10-CM | POA: Diagnosis not present

## 2016-05-23 DIAGNOSIS — I10 Essential (primary) hypertension: Secondary | ICD-10-CM | POA: Insufficient documentation

## 2016-05-23 DIAGNOSIS — E119 Type 2 diabetes mellitus without complications: Secondary | ICD-10-CM | POA: Insufficient documentation

## 2016-05-23 DIAGNOSIS — I639 Cerebral infarction, unspecified: Secondary | ICD-10-CM | POA: Diagnosis not present

## 2016-05-23 DIAGNOSIS — Z87891 Personal history of nicotine dependence: Secondary | ICD-10-CM | POA: Diagnosis not present

## 2016-05-23 LAB — BASIC METABOLIC PANEL
Anion gap: 7 (ref 5–15)
BUN: 12 mg/dL (ref 6–20)
CO2: 28 mmol/L (ref 22–32)
Calcium: 9.1 mg/dL (ref 8.9–10.3)
Chloride: 101 mmol/L (ref 101–111)
Creatinine, Ser: 0.76 mg/dL (ref 0.44–1.00)
GFR calc Af Amer: >60 mL/min (ref >60)
GFR calc non Af Amer: >60 mL/min (ref >60)
Glucose, Bld: 109 mg/dL — ABNORMAL HIGH (ref 65–99)
Potassium: 3.8 mmol/L (ref 3.5–5.1)
Sodium: 136 mmol/L (ref 135–145)

## 2016-05-23 LAB — HEPATIC FUNCTION PANEL
ALT: 18 U/L (ref 14–54)
AST: 20 U/L (ref 15–41)
Albumin: 3.8 g/dL (ref 3.5–5.0)
Alkaline Phosphatase: 75 U/L (ref 38–126)
Bilirubin, Direct: 0.1 mg/dL (ref 0.1–0.5)
Indirect Bilirubin: 0.2 mg/dL — ABNORMAL LOW (ref 0.3–0.9)
Total Bilirubin: 0.3 mg/dL (ref 0.3–1.2)
Total Protein: 7 g/dL (ref 6.5–8.1)

## 2016-05-23 LAB — CBC
HCT: 39.2 % (ref 36.0–46.0)
Hemoglobin: 13.3 g/dL (ref 12.0–15.0)
MCH: 30.6 pg (ref 26.0–34.0)
MCHC: 33.9 g/dL (ref 30.0–36.0)
MCV: 90.3 fL (ref 78.0–100.0)
Platelets: 248 10*3/uL (ref 150–400)
RBC: 4.34 MIL/uL (ref 3.87–5.11)
RDW: 12.8 % (ref 11.5–15.5)
WBC: 8.8 10*3/uL (ref 4.0–10.5)

## 2016-05-23 LAB — URINALYSIS, ROUTINE W REFLEX MICROSCOPIC
Bacteria, UA: NONE SEEN
Bilirubin Urine: NEGATIVE
Glucose, UA: NEGATIVE mg/dL
Hgb urine dipstick: NEGATIVE
Ketones, ur: NEGATIVE mg/dL
Nitrite: NEGATIVE
Protein, ur: NEGATIVE mg/dL
Specific Gravity, Urine: 1.005 (ref 1.005–1.030)
pH: 7 (ref 5.0–8.0)

## 2016-05-23 LAB — TROPONIN I
Troponin I: 0.03 ng/mL (ref <0.03)
Troponin I: 0.03 ng/mL (ref <0.03)

## 2016-05-23 LAB — CBG MONITORING, ED: Glucose-Capillary: 105 mg/dL — ABNORMAL HIGH (ref 65–99)

## 2016-05-23 LAB — PROTIME-INR
INR: 1.86
Prothrombin Time: 21.7 seconds — ABNORMAL HIGH (ref 11.4–15.2)

## 2016-05-23 MED ORDER — SODIUM CHLORIDE 0.9 % IV BOLUS (SEPSIS)
1000.0000 mL | Freq: Once | INTRAVENOUS | Status: AC
  Administered 2016-05-23: 1000 mL via INTRAVENOUS

## 2016-05-23 NOTE — ED Notes (Signed)
CRITICAL VALUE ALERT  Critical value received:  trop  Date of notification:  05/23/16  Time of notification:  2039  Critical value read back: yes  Nurse who received alert:  Kendell Bane rn  MD notified (1st page):  zammit  Time of first page:  2040 MD notified (2nd page):  Time of second page:  Responding MD:  zammit  Time MD responded:  2040

## 2016-05-23 NOTE — ED Notes (Signed)
CRITICAL VALUE ALERT  Critical value received:  Troponin - 0.03  Date of notification:  05/23/2016  Time of notification:  5872  Critical value read back: yes  Nurse who received alert:  LJS  MD notified (1st page):  Dr Roderic Palau  Time of first page:  22  MD notified (2nd page):  Time of second page:  Responding MD:  Dr Roderic Palau  Time MD responded:  1750

## 2016-05-23 NOTE — Telephone Encounter (Signed)
[facility] Orders received and placed on Dr. Oneida Alar chair to be signed when she returns.

## 2016-05-23 NOTE — ED Triage Notes (Signed)
Pt. Says her left leg has been numb for 2 days. Has been having trouble walking. Has a history of strokes. BP today was 90/50 while at home and 124/48. Today in triage BP is 117/49. Pt. Feels dizzy and lightheaded.

## 2016-05-23 NOTE — Discharge Instructions (Signed)
Drink plenty of fluids. And follow up with your md if any problems °

## 2016-05-23 NOTE — ED Provider Notes (Signed)
[place] DEPT Provider Note   CSN: [id number] Arrival date & time: 05/23/16  1535     History   Chief Complaint Chief Complaint  Patient presents with  . Hypotension    HPI [name] is a 71 y.o. female.  Patient complains of some dizziness and weakness and worse weakness in her left leg. She has a history of CVA.    Patient also states her blood pressure has been low    Weakness  Primary symptoms include no focal weakness. This is a recurrent problem. The current episode started more than 2 days ago. The problem has not changed since onset.There was no focality noted. There has been no fever. Pertinent negatives include no shortness of breath, no chest pain and no headaches. There were no medications administered prior to arrival. Associated medical issues do not include trauma.    Past Medical History:  Diagnosis Date  . Anxiety   . Arthritis   . Depression    History of psychosis and previous suicide attempt  . DVT, lower extremity, recurrent (HCC)    Long-term Coumadin per Dr. Legrand Rams  . Essential hypertension   . GERD (gastroesophageal reflux disease)   . Hemiplegia ([place]) 2010   Left side  . History of stroke    Acute infarct and right cerebral white matter small vessel disease 12/10  . Leg DVT (deep venous thromboembolism), acute ([place]) 2006  . Schizophrenia ([place])   . Stroke [facility])    left sided weakness  . Type 2 diabetes mellitus [facility])     Patient Active Problem List   Diagnosis Date Noted  . Chronic superficial gastritis without bleeding   . Stricture and stenosis of esophagus   . Nausea with vomiting 04/20/2016  . Abdominal pain, acute, bilateral lower quadrant 03/22/2016  . Lesion of right lobe of liver 03/22/2016  . Elevated troponin 02/13/2016  . IDA (iron deficiency anemia) 04/06/2015  . Hyperlipidemia 11/06/2014  . Obesity due to excess calories 11/06/2014  . Sedentary lifestyle 11/06/2014  . Rectal bleeding 10/21/2014  . Encounter for  therapeutic drug monitoring 03/24/2013  . Chronic anticoagulation 04/09/2010  . Type 2 diabetes mellitus with vascular disease ([place]) 03/24/2010  . Essential hypertension, benign 03/24/2010  . DVT, lower extremity, recurrent ([place]) 03/24/2010  . History of cardiovascular disorder 03/24/2010    Past Surgical History:  Procedure Laterality Date  . BACK SURGERY    . BIOPSY N/A 11/24/2014   Procedure: BIOPSY;  Surgeon: Danie Binder, MD;  Location: [facility] ORS;  Service: Endoscopy;  Laterality: N/A;  . BIOPSY  05/17/2016   Procedure: BIOPSY;  Surgeon: Danie Binder, MD;  Location: [facility] ENDO SUITE;  Service: Endoscopy;;  gastric biopsy  . COLONOSCOPY WITH PROPOFOL N/A 11/24/2014   Dr. Rudie Meyer polyps removed/moderate sized internal hemorrhoids, tubular adenomas. Next surveillance in 3 years  . ESOPHAGOGASTRODUODENOSCOPY (EGD) WITH PROPOFOL N/A 11/24/2014   Dr. Clayburn Pert HH/patent stricture at the gastroesophageal junction, mild non-erosive gastritis, path negative for H.pylori or celiac sprue  . ESOPHAGOGASTRODUODENOSCOPY (EGD) WITH PROPOFOL N/A 05/17/2016   Procedure: ESOPHAGOGASTRODUODENOSCOPY (EGD) WITH PROPOFOL;  Surgeon: Danie Binder, MD;  Location: [facility] ENDO SUITE;  Service: Endoscopy;  Laterality: N/A;  12:45pm  . GIVENS CAPSULE STUDY N/A 12/11/2014   MULTILPLE EROSION IN the stomach WITH ACTIVE OOZING. OCCASIONAL EROSIONS AND RARE ULCER SEEN IN PROXIMAL SMALL BOWEL . No masses or AVMs SEEN. NO OLD BLOOD OR FRESH BLOOD SEEN.   . POLYPECTOMY N/A 11/24/2014   Procedure: POLYPECTOMY;  Surgeon: Danie Binder, MD;  Location: [facility] ORS;  Service: Endoscopy;  Laterality: N/A;  . SAVORY DILATION N/A 05/17/2016   Procedure: SAVORY DILATION;  Surgeon: Danie Binder, MD;  Location: [facility] ENDO SUITE;  Service: Endoscopy;  Laterality: N/A;    OB History    No data available       Home Medications    Prior to Admission medications   Medication Sig Start Date End Date Taking? Authorizing Provider    acetaminophen (TYLENOL) 325 MG tablet Take 650 mg by mouth every 6 (six) hours as needed. For fever > 101    Yes Historical Provider, MD  ARIPiprazole (ABILIFY) 2 MG tablet Take 2 mg by mouth daily.  08/31/15  Yes Historical Provider, MD  calcium carbonate (TUMS - DOSED IN MG ELEMENTAL CALCIUM) 500 MG chewable tablet Chew 1 tablet by mouth 4 (four) times daily as needed for indigestion or heartburn.    Yes Historical Provider, MD  clonazePAM (KLONOPIN) 1 MG tablet Take 1 mg by mouth 3 (three) times daily.  10/08/14  Yes Historical Provider, MD  DULoxetine (CYMBALTA) 60 MG capsule Take 120 mg by mouth daily.    Yes Historical Provider, MD  enoxaparin (LOVENOX) 100 MG/ML injection Inject 1 mL (100 mg total) into the skin daily. Patient taking differently: Inject 100 mg into the skin daily. Starting on 05/04/2016-stopping on 06/07/2016 05/17/16 05/25/16 Yes Danie Binder, MD  HYDROcodone-acetaminophen (NORCO) 5-325 MG per tablet Take 1 tablet by mouth 3 (three) times daily.    Yes Historical Provider, MD  hydrocortisone (ANUSOL-HC) 2.5 % rectal cream Place 1 application rectally 2 (two) times daily. 05/22/16  Yes Annitta Needs, NP  Insulin Detemir (LEVEMIR FLEXTOUCH) 100 UNIT/ML Pen INJECT 80 UNITS SUBCUTANEOUSLY AT BEDTIME. Patient taking differently: Inject 80 Units into the skin at bedtime.  03/02/16  Yes Cassandria Anger, MD  iron polysaccharides (NIFEREX) 150 MG capsule Take 1 capsule (150 mg total) by mouth daily. 04/10/16  Yes Manon Hilding Kefalas, PA-C  latanoprost (XALATAN) 0.005 % ophthalmic solution Place 1 drop into both eyes at bedtime.   Yes Historical Provider, MD  loperamide (IMODIUM) 2 MG capsule Take 4 mg by mouth daily as needed for diarrhea or loose stools. Take 2 capsules initially then take 2 after each loose stool per [facility]   Yes Historical Provider, MD  metoCLOPramide (REGLAN) 5 MG tablet Take 1 tablet (5 mg total) by mouth every 8 (eight) hours as needed for nausea or vomiting (or bloating).  03/11/16  Yes Francine Graven, DO  metoprolol succinate (TOPROL-XL) 25 MG 24 hr tablet Take 12.5 mg by mouth 2 (two) times daily.    Yes Historical Provider, MD  mirtazapine (REMERON) 30 MG tablet Take 30 mg by mouth at bedtime.     Yes Historical Provider, MD  naloxegol oxalate (MOVANTIK) 12.5 MG TABS tablet Take 1 tablet (12.5 mg total) by mouth daily. 03/22/16  Yes Sandi L Fields, MD  NOVOLOG FLEXPEN 100 UNIT/ML FlexPen INJECT 25 UNITS SUBCUTANEOUSLY BEFORE MEALS IF BS IS BETWEEN 90-150. 150-200 ADD 1 UNIT , 201-250 ADD 2 UNITS 04/17/16  Yes Cassandria Anger, MD  NOVOLOG FLEXPEN 100 UNIT/ML FlexPen SS: 251-300 ADD 3 UNITS; 301-350 ADD 4 UNITS; 351-400 ADD 5 UNITS; 401-450 ADD 6 UNITS AND CALL DOCTOR. 04/17/16  Yes Cassandria Anger, MD  omeprazole (PRILOSEC) 40 MG capsule Take 40 mg by mouth daily.   Yes Historical Provider, MD  ondansetron (ZOFRAN) 4 MG tablet Take 1 tablet (  4 mg total) by mouth 4 (four) times daily -  before meals and at bedtime. 04/20/16  Yes Annitta Needs, NP  polyethylene glycol powder (GLYCOLAX/MIRALAX) powder Take 17 g by mouth daily as needed.   Yes Historical Provider, MD  polyvinyl alcohol (ARTIFICIAL TEARS) 1.4 % ophthalmic solution Place 1 drop into both eyes 3 (three) times daily.   Yes Historical Provider, MD  risperiDONE (RISPERDAL) 3 MG tablet Take 3 mg by mouth 2 (two) times daily.    Yes Historical Provider, MD  simvastatin (ZOCOR) 20 MG tablet Take 20 mg by mouth at bedtime.  03/31/16  Yes Historical Provider, MD  solifenacin (VESICARE) 5 MG tablet Take 5 mg by mouth daily.   Yes Historical Provider, MD  tiZANidine (ZANAFLEX) 2 MG tablet Take 2 mg by mouth daily.   Yes Historical Provider, MD  Vitamin D, Ergocalciferol, (DRISDOL) 50000 UNITS CAPS capsule Take 50,000 Units by mouth every Friday.    Yes Historical Provider, MD  warfarin (COUMADIN) 5 MG tablet Take 2.5-5 mg by mouth daily. 2.5mg  on MWF and 5mg  on all other days at 1700  Starting on 04/07/2016-ending  on 04/05/2019   Yes Historical Provider, MD  zolpidem (AMBIEN) 10 MG tablet Take 10 mg by mouth at bedtime.   Yes Historical Provider, MD  promethazine (PHENERGAN) 12.5 MG tablet TAKE 1 TABLET BY MOUTH EVERY 6 HOURS AS NEEDED FOR NAUSEA OR VOMITING. 04/12/16   Mahala Menghini, PA-C    Family History Family History  Problem Relation Age of Onset  . Hypertension Mother   . Colon cancer Neg Hx     Social History Social History  Substance Use Topics  . Smoking status: Former Smoker    Packs/day: 0.25    Years: 20.00    Types: Cigarettes    Quit date: 01/24/1995  . Smokeless tobacco: Never Used  . Alcohol use No     Allergies   Sulfa antibiotics and Sulfonamide derivatives   Review of Systems Review of Systems  Constitutional: Negative for appetite change and fatigue.  HENT: Negative for congestion, ear discharge and sinus pressure.   Eyes: Negative for discharge.  Respiratory: Negative for cough and shortness of breath.   Cardiovascular: Negative for chest pain.  Gastrointestinal: Negative for abdominal pain and diarrhea.  Genitourinary: Negative for frequency and hematuria.  Musculoskeletal: Negative for back pain.  Skin: Negative for rash.  Neurological: Positive for weakness. Negative for focal weakness, seizures and headaches.  Psychiatric/Behavioral: Negative for hallucinations.     Physical Exam Updated Vital Signs BP 113/80 (BP Location: Left Arm)   Pulse 66   Temp 98.1 F (36.7 C) (Oral)   Resp 20   Ht 5\' 6"  (1.676 m)   Wt 159 lb (72.1 kg)   SpO2 99%   BMI 25.66 kg/m   Physical Exam  Constitutional: She is oriented to person, place, and time. She appears well-developed.  HENT:  Head: Normocephalic.  Eyes: Conjunctivae and EOM are normal. No scleral icterus.  Neck: Neck supple. No thyromegaly present.  Cardiovascular: Normal rate and regular rhythm.  Exam reveals no gallop and no friction rub.   No murmur heard. Pulmonary/Chest: No stridor. She has no  wheezes. She has no rales. She exhibits no tenderness.  Abdominal: She exhibits no distension. There is no tenderness. There is no rebound.  Musculoskeletal: Normal range of motion. She exhibits no edema.  Weakness severe left leg.  Old cva  Lymphadenopathy:    She has no cervical adenopathy.  Neurological: She is oriented to person, place, and time. She exhibits normal muscle tone. Coordination normal.  Skin: No rash noted. No erythema.  Psychiatric: She has a normal mood and affect. Her behavior is normal.     ED Treatments / Results  Labs (all labs ordered are listed, but only abnormal results are displayed) Labs Reviewed  BASIC METABOLIC PANEL - Abnormal; Notable for the following:       Result Value   Glucose, Bld 109 (*)    All other components within normal limits  URINALYSIS, ROUTINE W REFLEX MICROSCOPIC - Abnormal; Notable for the following:    Leukocytes, UA MODERATE (*)    Squamous Epithelial / LPF 0-5 (*)    All other components within normal limits  HEPATIC FUNCTION PANEL - Abnormal; Notable for the following:    Indirect Bilirubin 0.2 (*)    All other components within normal limits  TROPONIN I - Abnormal; Notable for the following:    Troponin I 0.03 (*)    All other components within normal limits  TROPONIN I - Abnormal; Notable for the following:    Troponin I 0.03 (*)    All other components within normal limits  PROTIME-INR - Abnormal; Notable for the following:    Prothrombin Time 21.7 (*)    All other components within normal limits  CBG MONITORING, ED - Abnormal; Notable for the following:    Glucose-Capillary 105 (*)    All other components within normal limits  CBC  CBG MONITORING, ED    EKG  EKG Interpretation None       Radiology Mr Brain Wo Contrast  Result Date: 05/23/2016 CLINICAL DATA:  71 y/o  F; evaluation for stroke. EXAM: MRI HEAD WITHOUT CONTRAST TECHNIQUE: Multiplanar, multiecho pulse sequences of the brain and surrounding  structures were obtained without intravenous contrast. COMPARISON:  02/13/2016 CT of the head. FINDINGS: Brain: No acute infarction, hemorrhage, hydrocephalus, extra-axial collection or mass lesion. Foci of T2 FLAIR hyperintense signal abnormality throughout subcortical and periventricular white matter are compatible with advanced chronic microvascular ischemic changes. There is mild brain parenchymal volume loss for age. There are small infarcts within periventricular white matter in the frontal regions. There are small chronic lacunar infarcts within the left thalamus and left lentiform nucleus. There are a few small foci of susceptibility hypointensity wall within the medial temporal lobes, right posterior midbrain, right posterior temporal lobe, and right cerebellar hemisphere probably representing hemosiderin deposition from old microhemorrhage. Vascular: Normal flow voids. Skull and upper cervical spine: Normal marrow signal. Sinuses/Orbits: Postsurgical changes related to partial ethmoidectomy and maxillary antrostomy. Mild mucosal thickening of residual ethmoid and maxillary sinuses. Right mastoid air cell effusion. No abnormal signal of left mastoid air cells. Orbits are unremarkable. Other: None. IMPRESSION: 1. No acute intracranial abnormality identified. 2. Advanced chronic microvascular ischemic changes and mild for age parenchymal volume loss of the brain. 3. Paranasal sinus postsurgical changes and mild diffuse mucosal thickening. Right mastoid effusion. Electronically Signed   By: Kristine Garbe M.D.   On: 05/23/2016 18:13    Procedures Procedures (including critical care time)  Medications Ordered in ED Medications  sodium chloride 0.9 % bolus 1,000 mL (0 mLs Intravenous Stopped 05/23/16 2014)     Initial Impression / Assessment and Plan / ED Course  I have reviewed the triage vital signs and the nursing notes.  Pertinent labs & imaging results that were available during my  care of the patient were reviewed by me and considered in my  medical decision making (see chart for details).     Patient with mild dehydration. Patient's dizziness and weakness improved with IV fluids. She'll follow-up as needed  Final Clinical Impressions(s) / ED Diagnoses   Final diagnoses:  Dehydration    New Prescriptions New Prescriptions   No medications on file     Milton Ferguson, MD 05/23/16 2107

## 2016-05-23 NOTE — ED Notes (Signed)
Upon review of pt's medications from [facility], pt was given BP meds today.

## 2016-05-25 ENCOUNTER — Ambulatory Visit (INDEPENDENT_AMBULATORY_CARE_PROVIDER_SITE_OTHER): Payer: Medicare Other | Admitting: *Deleted

## 2016-05-25 DIAGNOSIS — R112 Nausea with vomiting, unspecified: Secondary | ICD-10-CM | POA: Diagnosis not present

## 2016-05-25 DIAGNOSIS — I8291 Chronic embolism and thrombosis of unspecified vein: Secondary | ICD-10-CM | POA: Diagnosis not present

## 2016-05-25 DIAGNOSIS — Z7901 Long term (current) use of anticoagulants: Secondary | ICD-10-CM | POA: Diagnosis not present

## 2016-05-25 DIAGNOSIS — Z8679 Personal history of other diseases of the circulatory system: Secondary | ICD-10-CM

## 2016-05-25 DIAGNOSIS — Z5181 Encounter for therapeutic drug level monitoring: Secondary | ICD-10-CM

## 2016-05-25 DIAGNOSIS — D1803 Hemangioma of intra-abdominal structures: Secondary | ICD-10-CM | POA: Diagnosis not present

## 2016-05-25 DIAGNOSIS — E119 Type 2 diabetes mellitus without complications: Secondary | ICD-10-CM | POA: Diagnosis not present

## 2016-05-25 DIAGNOSIS — I69354 Hemiplegia and hemiparesis following cerebral infarction affecting left non-dominant side: Secondary | ICD-10-CM | POA: Diagnosis not present

## 2016-05-25 LAB — POCT INR
INR: 2.3
INR: 2.9

## 2016-05-26 DIAGNOSIS — E119 Type 2 diabetes mellitus without complications: Secondary | ICD-10-CM | POA: Diagnosis not present

## 2016-05-26 DIAGNOSIS — Z794 Long term (current) use of insulin: Secondary | ICD-10-CM | POA: Diagnosis not present

## 2016-05-26 DIAGNOSIS — H25813 Combined forms of age-related cataract, bilateral: Secondary | ICD-10-CM | POA: Diagnosis not present

## 2016-05-26 DIAGNOSIS — Z7984 Long term (current) use of oral hypoglycemic drugs: Secondary | ICD-10-CM | POA: Diagnosis not present

## 2016-05-29 DIAGNOSIS — M79674 Pain in right toe(s): Secondary | ICD-10-CM | POA: Diagnosis not present

## 2016-05-29 DIAGNOSIS — B351 Tinea unguium: Secondary | ICD-10-CM | POA: Diagnosis not present

## 2016-05-29 DIAGNOSIS — M79675 Pain in left toe(s): Secondary | ICD-10-CM | POA: Diagnosis not present

## 2016-05-31 ENCOUNTER — Other Ambulatory Visit: Payer: Self-pay | Admitting: "Endocrinology

## 2016-05-31 DIAGNOSIS — E1159 Type 2 diabetes mellitus with other circulatory complications: Secondary | ICD-10-CM | POA: Diagnosis not present

## 2016-05-31 NOTE — Telephone Encounter (Signed)
ORDER SIGNED. RN WILL FAX.

## 2016-06-01 ENCOUNTER — Other Ambulatory Visit: Payer: Self-pay | Admitting: "Endocrinology

## 2016-06-01 ENCOUNTER — Telehealth: Payer: Self-pay | Admitting: Cardiology

## 2016-06-01 ENCOUNTER — Ambulatory Visit (INDEPENDENT_AMBULATORY_CARE_PROVIDER_SITE_OTHER): Payer: Medicare Other | Admitting: *Deleted

## 2016-06-01 DIAGNOSIS — D1803 Hemangioma of intra-abdominal structures: Secondary | ICD-10-CM | POA: Diagnosis not present

## 2016-06-01 DIAGNOSIS — E119 Type 2 diabetes mellitus without complications: Secondary | ICD-10-CM | POA: Diagnosis not present

## 2016-06-01 DIAGNOSIS — I69354 Hemiplegia and hemiparesis following cerebral infarction affecting left non-dominant side: Secondary | ICD-10-CM | POA: Diagnosis not present

## 2016-06-01 DIAGNOSIS — Z8679 Personal history of other diseases of the circulatory system: Secondary | ICD-10-CM

## 2016-06-01 DIAGNOSIS — I8291 Chronic embolism and thrombosis of unspecified vein: Secondary | ICD-10-CM | POA: Diagnosis not present

## 2016-06-01 DIAGNOSIS — Z7901 Long term (current) use of anticoagulants: Secondary | ICD-10-CM | POA: Diagnosis not present

## 2016-06-01 DIAGNOSIS — Z5181 Encounter for therapeutic drug level monitoring: Secondary | ICD-10-CM

## 2016-06-01 DIAGNOSIS — R112 Nausea with vomiting, unspecified: Secondary | ICD-10-CM | POA: Diagnosis not present

## 2016-06-01 LAB — HGB A1C W/O EAG: Hgb A1c MFr Bld: 6.4 % — ABNORMAL HIGH (ref 4.8–5.6)

## 2016-06-01 LAB — COMPREHENSIVE METABOLIC PANEL
ALT: 14 IU/L (ref 0–32)
AST: 16 IU/L (ref 0–40)
Albumin/Globulin Ratio: 1.5 (ref 1.2–2.2)
Albumin: 4.1 g/dL (ref 3.5–4.8)
Alkaline Phosphatase: 81 IU/L (ref 39–117)
BUN/Creatinine Ratio: 12 (ref 12–28)
BUN: 8 mg/dL (ref 8–27)
Bilirubin Total: 0.3 mg/dL (ref 0.0–1.2)
CO2: 27 mmol/L (ref 18–29)
Calcium: 9.1 mg/dL (ref 8.7–10.3)
Chloride: 97 mmol/L (ref 96–106)
Creatinine, Ser: 0.69 mg/dL (ref 0.57–1.00)
GFR calc Af Amer: 102 mL/min/{1.73_m2} (ref >59)
GFR calc non Af Amer: 88 mL/min/{1.73_m2} (ref >59)
Globulin, Total: 2.8 g/dL (ref 1.5–4.5)
Glucose: 141 mg/dL — ABNORMAL HIGH (ref 65–99)
Potassium: 4.5 mmol/L (ref 3.5–5.2)
Sodium: 140 mmol/L (ref 134–144)
Total Protein: 6.9 g/dL (ref 6.0–8.5)

## 2016-06-01 LAB — AMBIG ABBREV CMP14 DEFAULT

## 2016-06-01 NOTE — Telephone Encounter (Signed)
Done.  See coumadin note. 

## 2016-06-01 NOTE — Telephone Encounter (Signed)
[name] w/ [facility]  [phone]  INR 2.9

## 2016-06-02 ENCOUNTER — Other Ambulatory Visit: Payer: Self-pay

## 2016-06-02 MED ORDER — WARFARIN SODIUM 5 MG PO TABS: 2.5000 mg | ORAL_TABLET | Freq: Every day | ORAL | 0 refills | Status: DC

## 2016-06-03 ENCOUNTER — Telehealth: Payer: Self-pay | Admitting: Gastroenterology

## 2016-06-03 NOTE — Telephone Encounter (Signed)
Please call pt. HER stomach Bx shows gastritis AND BENIGN STOMACHPOLYPS. Continue OMEPRAZOLE. FOLLOW UP IN AUG 2018 E30 DYSPHAGIA/VOMITING.

## 2016-06-05 ENCOUNTER — Other Ambulatory Visit: Payer: Self-pay | Admitting: Cardiology

## 2016-06-05 ENCOUNTER — Telehealth: Payer: Self-pay

## 2016-06-05 NOTE — Telephone Encounter (Signed)
Yes, may take Zofran 30 minutes before meals and at bedtime. How do we do an order?

## 2016-06-05 NOTE — Telephone Encounter (Signed)
Patient scheduled.

## 2016-06-05 NOTE — Telephone Encounter (Signed)
[name] is aware at [facility] and I am faxing this info to her. See separate phone note for Zofran request.

## 2016-06-05 NOTE — Telephone Encounter (Addendum)
Butch Penny, the nurse at [facility], said the pt is asking for Zofran before every meal now. The order she has says prn ( although on file in med list ours has before meals and at bedtime). Pt is asking for the Zofran everytime before meals and Butch Penny said she needs an order to give before meals.  Vicente Males, please advise!

## 2016-06-05 NOTE — Telephone Encounter (Signed)
I wrote on Rx pad and faxed to Butch Penny at [facility].

## 2016-06-05 NOTE — Telephone Encounter (Signed)
Thanks

## 2016-06-07 ENCOUNTER — Encounter: Payer: Self-pay | Admitting: "Endocrinology

## 2016-06-07 ENCOUNTER — Ambulatory Visit (INDEPENDENT_AMBULATORY_CARE_PROVIDER_SITE_OTHER): Payer: Medicare Other | Admitting: "Endocrinology

## 2016-06-07 VITALS — BP 99/64 | HR 96 | Ht 66.0 in

## 2016-06-07 DIAGNOSIS — E782 Mixed hyperlipidemia: Secondary | ICD-10-CM

## 2016-06-07 DIAGNOSIS — Z9189 Other specified personal risk factors, not elsewhere classified: Secondary | ICD-10-CM | POA: Diagnosis not present

## 2016-06-07 DIAGNOSIS — E1159 Type 2 diabetes mellitus with other circulatory complications: Secondary | ICD-10-CM | POA: Diagnosis not present

## 2016-06-07 DIAGNOSIS — I10 Essential (primary) hypertension: Secondary | ICD-10-CM | POA: Diagnosis not present

## 2016-06-07 MED ORDER — INSULIN ASPART 100 UNIT/ML FLEXPEN: PEN_INJECTOR | SUBCUTANEOUS | 2 refills | Status: DC

## 2016-06-07 MED ORDER — INSULIN DETEMIR 100 UNIT/ML FLEXPEN: 60.0000 [IU] | PEN_INJECTOR | Freq: Every day | SUBCUTANEOUS | 2 refills | Status: DC

## 2016-06-07 NOTE — Telephone Encounter (Signed)
I received a call from [phone]) with [facility] and he stated that Dr. Legrand Rams changed the pts Zofran to twice a day prn on 5/2, because the patient was very drowsy.  Do you still want to fill the Zofran for 4 times a day?  Routing to [place].

## 2016-06-07 NOTE — Progress Notes (Signed)
Subjective:    Patient ID: [name], female    DOB: 02/02/1945,    Past Medical History:  Diagnosis Date  . Anxiety   . Arthritis   . Depression    History of psychosis and previous suicide attempt  . DVT, lower extremity, recurrent (HCC)    Long-term Coumadin per Dr. Legrand Rams  . Essential hypertension   . GERD (gastroesophageal reflux disease)   . Hemiplegia ([place]) 2010   Left side  . History of stroke    Acute infarct and right cerebral white matter small vessel disease 12/10  . Leg DVT (deep venous thromboembolism), acute ([place]) 2006  . Schizophrenia ([place])   . Stroke [facility])    left sided weakness  . Type 2 diabetes mellitus ([place])    Past Surgical History:  Procedure Laterality Date  . BACK SURGERY    . BIOPSY N/A 11/24/2014   Procedure: BIOPSY;  Surgeon: Danie Binder, MD;  Location: [facility];  Service: Endoscopy;  Laterality: N/A;  . BIOPSY  05/17/2016   Procedure: BIOPSY;  Surgeon: Danie Binder, MD;  Location: [facility];  Service: Endoscopy;;  gastric biopsy  . COLONOSCOPY WITH PROPOFOL N/A 11/24/2014   Dr. Rudie Meyer polyps removed/moderate sized internal hemorrhoids, tubular adenomas. Next surveillance in 3 years  . ESOPHAGOGASTRODUODENOSCOPY (EGD) WITH PROPOFOL N/A 11/24/2014   Dr. Clayburn Pert HH/patent stricture at the gastroesophageal junction, mild non-erosive gastritis, path negative for H.pylori or celiac sprue  . ESOPHAGOGASTRODUODENOSCOPY (EGD) WITH PROPOFOL N/A 05/17/2016   Procedure: ESOPHAGOGASTRODUODENOSCOPY (EGD) WITH PROPOFOL;  Surgeon: Danie Binder, MD;  Location: [facility];  Service: Endoscopy;  Laterality: N/A;  12:45pm  . GIVENS CAPSULE STUDY N/A 12/11/2014   MULTILPLE EROSION IN the stomach WITH ACTIVE OOZING. OCCASIONAL EROSIONS AND RARE ULCER SEEN IN PROXIMAL SMALL BOWEL . No masses or AVMs SEEN. NO OLD BLOOD OR FRESH BLOOD SEEN.   . POLYPECTOMY N/A 11/24/2014   Procedure: POLYPECTOMY;  Surgeon: Danie Binder, MD;  Location: [facility];   Service: Endoscopy;  Laterality: N/A;  . SAVORY DILATION N/A 05/17/2016   Procedure: SAVORY DILATION;  Surgeon: Danie Binder, MD;  Location: [facility];  Service: Endoscopy;  Laterality: N/A;   Social History   Social History  . Marital status: Widowed    Spouse name: N/A  . Number of children: N/A  . Years of education: N/A   Social History Main Topics  . Smoking status: Former Smoker    Packs/day: 0.25    Years: 20.00    Types: Cigarettes    Quit date: 01/24/1995  . Smokeless tobacco: Never Used  . Alcohol use No  . Drug use: No  . Sexual activity: No   Other Topics Concern  . None   Social History Narrative  . None   Outpatient Encounter Prescriptions as of 06/07/2016  Medication Sig  . acetaminophen (TYLENOL) 325 MG tablet Take 650 mg by mouth every 6 (six) hours as needed. For fever > 101   . ARIPiprazole (ABILIFY) 2 MG tablet Take 2 mg by mouth daily.   . calcium carbonate (TUMS - DOSED IN MG ELEMENTAL CALCIUM) 500 MG chewable tablet Chew 1 tablet by mouth 4 (four) times daily as needed for indigestion or heartburn.   . clonazePAM (KLONOPIN) 1 MG tablet Take 1 mg by mouth 3 (three) times daily.   . DULoxetine (CYMBALTA) 60 MG capsule Take 120 mg by mouth daily.   Marland Kitchen enoxaparin (LOVENOX) 100 MG/ML injection Inject  1 mL (100 mg total) into the skin daily. (Patient taking differently: Inject 100 mg into the skin daily. Starting on 05/04/2016-stopping on 06/07/2016)  . HYDROcodone-acetaminophen (NORCO) 5-325 MG per tablet Take 1 tablet by mouth 3 (three) times daily.   . hydrocortisone (ANUSOL-HC) 2.5 % rectal cream Place 1 application rectally 2 (two) times daily.  . insulin aspart (NOVOLOG FLEXPEN) 100 UNIT/ML FlexPen INJECT 20 UNITS SUBCUTANEOUSLY BEFORE MEALS IF BS IS BETWEEN 90-150. 150-200 ADD 1 UNIT , 201-250 ADD 2 UNITS  . Insulin Detemir (LEVEMIR FLEXTOUCH) 100 UNIT/ML Pen Inject 60 Units into the skin daily at 10 pm. INJECT 80 UNITS SUBCUTANEOUSLY AT BEDTIME.  .  iron polysaccharides (NIFEREX) 150 MG capsule Take 1 capsule (150 mg total) by mouth daily.  Marland Kitchen latanoprost (XALATAN) 0.005 % ophthalmic solution Place 1 drop into both eyes at bedtime.  Marland Kitchen loperamide (IMODIUM) 2 MG capsule Take 4 mg by mouth daily as needed for diarrhea or loose stools. Take 2 capsules initially then take 2 after each loose stool per MAR  . metoCLOPramide (REGLAN) 5 MG tablet Take 1 tablet (5 mg total) by mouth every 8 (eight) hours as needed for nausea or vomiting (or bloating).  . metoprolol succinate (TOPROL-XL) 25 MG 24 hr tablet Take 12.5 mg by mouth 2 (two) times daily.   . mirtazapine (REMERON) 30 MG tablet Take 30 mg by mouth at bedtime.    . naloxegol oxalate (MOVANTIK) 12.5 MG TABS tablet Take 1 tablet (12.5 mg total) by mouth daily.  Marland Kitchen NOVOLOG FLEXPEN 100 UNIT/ML FlexPen SS: 251-300 ADD 3 UNITS; 301-350 ADD 4 UNITS; 351-400 ADD 5 UNITS; 401-450 ADD 6 UNITS AND CALL DOCTOR.  Marland Kitchen omeprazole (PRILOSEC) 40 MG capsule Take 40 mg by mouth daily.  . ondansetron (ZOFRAN) 4 MG tablet Take 1 tablet (4 mg total) by mouth 4 (four) times daily -  before meals and at bedtime.  . polyethylene glycol powder (GLYCOLAX/MIRALAX) powder Take 17 g by mouth daily as needed.  . polyvinyl alcohol (ARTIFICIAL TEARS) 1.4 % ophthalmic solution Place 1 drop into both eyes 3 (three) times daily.  . promethazine (PHENERGAN) 12.5 MG tablet TAKE 1 TABLET BY MOUTH EVERY 6 HOURS AS NEEDED FOR NAUSEA OR VOMITING.  . risperiDONE (RISPERDAL) 3 MG tablet Take 3 mg by mouth 2 (two) times daily.   . simvastatin (ZOCOR) 20 MG tablet Take 20 mg by mouth at bedtime.   . solifenacin (VESICARE) 5 MG tablet Take 5 mg by mouth daily.  Marland Kitchen tiZANidine (ZANAFLEX) 2 MG tablet Take 2 mg by mouth daily.  . Vitamin D, Ergocalciferol, (DRISDOL) 50000 UNITS CAPS capsule Take 50,000 Units by mouth every Friday.   . warfarin (COUMADIN) 5 MG tablet TAKE 1 TABLET BY MOUTH ONCE DAILY ON SUNDAY,TUESDAY,THURSDAY & SATURDAY. TAKE 1/2  TABLET (2.5mg ) ON [place]. FRIDAY.  Marland Kitchen zolpidem (AMBIEN) 10 MG tablet Take 10 mg by mouth at bedtime.  . [DISCONTINUED] Insulin Detemir (LEVEMIR FLEXTOUCH) 100 UNIT/ML Pen INJECT 80 UNITS SUBCUTANEOUSLY AT BEDTIME. (Patient taking differently: Inject 80 Units into the skin at bedtime. )  . [DISCONTINUED] NOVOLOG FLEXPEN 100 UNIT/ML FlexPen INJECT 25 UNITS SUBCUTANEOUSLY BEFORE MEALS IF BS IS BETWEEN 90-150. 150-200 ADD 1 UNIT , 201-250 ADD 2 UNITS   No facility-administered encounter medications on file as of 06/07/2016.    ALLERGIES: Allergies  Allergen Reactions  . Sulfa Antibiotics Rash  . Sulfonamide Derivatives Rash    REACTION: rash   VACCINATION STATUS:  There is no immunization history  on file for this patient.  Diabetes  She presents for her follow-up diabetic visit. She has type 2 diabetes mellitus. Onset time: She was diagnosed at approximate age of 36 years. Her disease course has been improving. There are no hypoglycemic associated symptoms. Pertinent negatives for hypoglycemia include no confusion, pallor or seizures. Pertinent negatives for diabetes include no polydipsia, no polyphagia and no polyuria. There are no hypoglycemic complications. Symptoms are improving. Diabetic complications include a CVA. Risk factors for coronary artery disease include dyslipidemia, diabetes mellitus, obesity, sedentary lifestyle and hypertension. Current diabetic treatment includes intensive insulin program and oral agent (monotherapy) (She kept requiring large dose of insulin due to consumption of large quantities of processed carbohydrates.Marland Kitchen). Her weight is increasing steadily. She is following a generally unhealthy diet. When asked about meal planning, she reported none. She never participates in exercise. Her home blood glucose trend is decreasing steadily. Her breakfast blood glucose range is generally 140-180 mg/dl. Her lunch blood glucose range is generally 140-180 mg/dl. Her  dinner blood glucose range is generally 140-180 mg/dl. Her overall blood glucose range is 140-180 mg/dl. An ACE inhibitor/angiotensin II receptor blocker is being taken.  Hypertension  This is a chronic problem. The current episode started more than 1 year ago. The problem is controlled. Pertinent negatives include no palpitations or shortness of breath. Risk factors for coronary artery disease include dyslipidemia and diabetes mellitus. Hypertensive end-organ damage includes CVA.  Hyperlipidemia  This is a chronic problem. The current episode started more than 1 year ago. Exacerbating diseases include diabetes. Pertinent negatives include no shortness of breath. Risk factors for coronary artery disease include diabetes mellitus, dyslipidemia, hypertension, obesity and a sedentary lifestyle.     Review of Systems  Constitutional: Negative for unexpected weight change.       Uses walker to get around.   HENT: Negative for trouble swallowing and voice change.   Eyes: Negative for visual disturbance.  Respiratory: Negative for shortness of breath and wheezing.   Cardiovascular: Negative for palpitations and leg swelling.  Gastrointestinal: Negative for diarrhea.  Endocrine: Negative for cold intolerance, heat intolerance, polydipsia, polyphagia and polyuria.  Skin: Negative for color change, pallor and wound.  Neurological: Negative for seizures.  Psychiatric/Behavioral: Negative for confusion and suicidal ideas.    Objective:    BP 99/64   Pulse 96   Ht 5\' 6"  (1.676 m)   Wt Readings from Last 3 Encounters:  05/23/16 159 lb (72.1 kg)  05/16/16 157 lb (71.2 kg)  04/20/16 159 lb 6.4 oz (72.3 kg)    Physical Exam  Constitutional: She is oriented to person, place, and time. She appears well-developed.  HENT:  Head: Normocephalic and atraumatic.  Eyes: EOM are normal.  Neck: Normal range of motion. Neck supple. No tracheal deviation present. No thyromegaly present.  Cardiovascular:  Normal rate and regular rhythm.   Pulmonary/Chest: Effort normal and breath sounds normal.  Abdominal: Soft. Bowel sounds are normal. There is no tenderness. There is no guarding.  Musculoskeletal: Normal range of motion. She exhibits no edema.  Neurological: She is alert and oriented to person, place, and time. She has normal reflexes.  She uses walker to get around.  Skin: Skin is warm and dry. No rash noted. No erythema. No pallor.  Psychiatric: She has a normal mood and affect. Judgment normal.    Results for orders placed or performed in visit on 06/01/16  POCT INR  Result Value Ref Range   INR 2.9    Complete  Blood Count (Most recent): Lab Results  Component Value Date   WBC 8.8 05/23/2016   HGB 13.3 05/23/2016   HCT 39.2 05/23/2016   MCV 90.3 05/23/2016   PLT 248 05/23/2016   Chemistry (most recent): Lab Results  Component Value Date   NA 140 05/31/2016   K 4.5 05/31/2016   CL 97 05/31/2016   CO2 27 05/31/2016   BUN 8 05/31/2016   CREATININE 0.69 05/31/2016   Diabetic Labs (most recent): Lab Results  Component Value Date   HGBA1C 6.4 (H) 05/31/2016   HGBA1C 6.9 (H) 02/24/2016   HGBA1C 7.0 (H) 11/17/2015   Lipid Panel     Component Value Date/Time   CHOL (H) 01/03/2009 0630    210        ATP III CLASSIFICATION:  <200     mg/dL   Desirable  200-239  mg/dL   Borderline High  >=240    mg/dL   High          TRIG 161 (H) 01/03/2009 0630   HDL 35 (L) 01/03/2009 0630   CHOLHDL 6.0 01/03/2009 0630   VLDL 32 01/03/2009 0630   LDLCALC (H) 01/03/2009 0630    143        Total Cholesterol/HDL:CHD Risk Coronary Heart Disease Risk Table                     Men   Women  1/2 Average Risk   3.4   3.3  Average Risk       5.0   4.4  2 X Average Risk   9.6   7.1  3 X Average Risk  23.4   11.0        Use the calculated Patient Ratio above and the CHD Risk Table to determine the patient's CHD Risk.        ATP III CLASSIFICATION (LDL):  <100     mg/dL   Optimal   100-129  mg/dL   Near or Above                    Optimal  130-159  mg/dL   Borderline  160-189  mg/dL   High  >190     mg/dL   Very High      Assessment & Plan:   1. Type 2 diabetes mellitus with vascular disease ([place]) Her diabetes is  complicated by recurrent CVA. Patient came with improved glucose profile, and  recent A1c  Has improved to 6.4% from 8.2%.   Recent labs reviewed. - Patient remains at a high risk for more acute and chronic complications of diabetes which include CAD, CVA, CKD, retinopathy, and neuropathy. These are all discussed in detail with the patient.  - I have re-counseled the patient on diet management and weight loss  by adopting a carbohydrate restricted / protein rich  Diet. - Patient is advised to stick to a routine mealtimes to eat 3 meals  a day and avoid unnecessary snacks ( to snack only to correct hypoglycemia).  - Suggestion is made for patient to avoid simple carbohydrates   from their diet including Cakes , Desserts, Ice Cream,  Soda (  diet and regular) , Sweet Tea , Candies,  Chips, Cookies, Artificial Sweeteners,   and "Sugar-free" Products .  This will help patient to have stable blood glucose profile and potentially avoid unintended  Weight gain.   - I have approached patient with the following individualized plan to manage  diabetes and patient agrees.  - Lower Levemir to 60 units qhs,  Lower  Novolog to 20 units TIDAC for premeal BG readings of 90-150mg /dl, plus patient specific sliding scale insulin for correction of unexpected hyperglycemia above 150mg /dl, associated with strict monitoring of BG AC and HS.   -Adjustment parameters for hypo and hyperglycemia were given in a written document to patient. -Patient is encouraged to call clinic for blood glucose levels less than 70 or above 300 mg /dl.  - she did not tolerate Metformin. - Patient specific target  for A1c; LDL, HDL, Triglycerides, and  Waist Circumference were discussed in  detail.  2) BP/HTN: Controlled. Continue current medications including ACEI. She is only on very low-dose metoprolol and very low-dose lisinopril. If she becomes symptomatic of hypotension, one of these medications should be stopped.  3) Lipids/HPL: continue statins. 4)  Weight/Diet:  exercise, and carbohydrates information provided.  5) Chronic Care/Health Maintenance:  -Patient is  on ACEI and Statin medications and encouraged to continue to follow up with Ophthalmology, Podiatrist at least yearly or according to recommendations, and advised to  stay away from smoking. I have recommended yearly flu vaccine and pneumonia vaccination at least every 5 years; and  sleep for at least 7 hours a day.  I advised patient to maintain close follow up with their PCP for primary care needs.  Patient is asked to bring meter and  blood glucose logs during their next visit.   Follow up plan: Return in about 3 months (around 09/07/2016) for follow up with pre-visit labs, meter, and logs.  Glade Lloyd, MD Phone: [phone]  Fax: [phone]   06/07/2016, 11:48 AM

## 2016-06-07 NOTE — Telephone Encounter (Signed)
I won't overstep Dr. Legrand Rams. If he feels she is having side effects from medications, needs to go back to BID.

## 2016-06-07 NOTE — Patient Instructions (Signed)

## 2016-06-07 NOTE — Telephone Encounter (Signed)
Collin at [place] made aware

## 2016-06-08 DIAGNOSIS — I69354 Hemiplegia and hemiparesis following cerebral infarction affecting left non-dominant side: Secondary | ICD-10-CM | POA: Diagnosis not present

## 2016-06-08 DIAGNOSIS — I8291 Chronic embolism and thrombosis of unspecified vein: Secondary | ICD-10-CM | POA: Diagnosis not present

## 2016-06-08 DIAGNOSIS — R112 Nausea with vomiting, unspecified: Secondary | ICD-10-CM | POA: Diagnosis not present

## 2016-06-08 DIAGNOSIS — Z7901 Long term (current) use of anticoagulants: Secondary | ICD-10-CM | POA: Diagnosis not present

## 2016-06-08 DIAGNOSIS — D1803 Hemangioma of intra-abdominal structures: Secondary | ICD-10-CM | POA: Diagnosis not present

## 2016-06-08 DIAGNOSIS — E119 Type 2 diabetes mellitus without complications: Secondary | ICD-10-CM | POA: Diagnosis not present

## 2016-06-10 DIAGNOSIS — E1165 Type 2 diabetes mellitus with hyperglycemia: Secondary | ICD-10-CM | POA: Diagnosis not present

## 2016-06-10 DIAGNOSIS — I6350 Cerebral infarction due to unspecified occlusion or stenosis of unspecified cerebral artery: Secondary | ICD-10-CM | POA: Diagnosis not present

## 2016-06-15 ENCOUNTER — Ambulatory Visit (INDEPENDENT_AMBULATORY_CARE_PROVIDER_SITE_OTHER): Payer: Medicare Other | Admitting: *Deleted

## 2016-06-15 DIAGNOSIS — Z5181 Encounter for therapeutic drug level monitoring: Secondary | ICD-10-CM

## 2016-06-15 DIAGNOSIS — Z8679 Personal history of other diseases of the circulatory system: Secondary | ICD-10-CM

## 2016-06-15 DIAGNOSIS — I8291 Chronic embolism and thrombosis of unspecified vein: Secondary | ICD-10-CM | POA: Diagnosis not present

## 2016-06-15 DIAGNOSIS — I69354 Hemiplegia and hemiparesis following cerebral infarction affecting left non-dominant side: Secondary | ICD-10-CM | POA: Diagnosis not present

## 2016-06-15 DIAGNOSIS — Z7901 Long term (current) use of anticoagulants: Secondary | ICD-10-CM | POA: Diagnosis not present

## 2016-06-15 DIAGNOSIS — R112 Nausea with vomiting, unspecified: Secondary | ICD-10-CM | POA: Diagnosis not present

## 2016-06-15 DIAGNOSIS — E119 Type 2 diabetes mellitus without complications: Secondary | ICD-10-CM | POA: Diagnosis not present

## 2016-06-15 DIAGNOSIS — D1803 Hemangioma of intra-abdominal structures: Secondary | ICD-10-CM | POA: Diagnosis not present

## 2016-06-15 LAB — POCT INR: INR: 3.3

## 2016-06-20 DIAGNOSIS — I69354 Hemiplegia and hemiparesis following cerebral infarction affecting left non-dominant side: Secondary | ICD-10-CM | POA: Diagnosis not present

## 2016-06-20 DIAGNOSIS — Z7901 Long term (current) use of anticoagulants: Secondary | ICD-10-CM | POA: Diagnosis not present

## 2016-06-20 DIAGNOSIS — I8291 Chronic embolism and thrombosis of unspecified vein: Secondary | ICD-10-CM | POA: Diagnosis not present

## 2016-06-20 DIAGNOSIS — D1803 Hemangioma of intra-abdominal structures: Secondary | ICD-10-CM | POA: Diagnosis not present

## 2016-06-20 DIAGNOSIS — R112 Nausea with vomiting, unspecified: Secondary | ICD-10-CM | POA: Diagnosis not present

## 2016-06-20 DIAGNOSIS — E119 Type 2 diabetes mellitus without complications: Secondary | ICD-10-CM | POA: Diagnosis not present

## 2016-06-28 DIAGNOSIS — F333 Major depressive disorder, recurrent, severe with psychotic symptoms: Secondary | ICD-10-CM | POA: Diagnosis not present

## 2016-06-28 DIAGNOSIS — E1165 Type 2 diabetes mellitus with hyperglycemia: Secondary | ICD-10-CM | POA: Diagnosis not present

## 2016-06-28 DIAGNOSIS — G8190 Hemiplegia, unspecified affecting unspecified side: Secondary | ICD-10-CM | POA: Diagnosis not present

## 2016-06-29 ENCOUNTER — Telehealth: Payer: Self-pay | Admitting: *Deleted

## 2016-06-29 ENCOUNTER — Other Ambulatory Visit: Payer: Self-pay | Admitting: "Endocrinology

## 2016-06-29 ENCOUNTER — Ambulatory Visit (INDEPENDENT_AMBULATORY_CARE_PROVIDER_SITE_OTHER): Payer: Medicare Other | Admitting: *Deleted

## 2016-06-29 DIAGNOSIS — D1803 Hemangioma of intra-abdominal structures: Secondary | ICD-10-CM | POA: Diagnosis not present

## 2016-06-29 DIAGNOSIS — I69354 Hemiplegia and hemiparesis following cerebral infarction affecting left non-dominant side: Secondary | ICD-10-CM | POA: Diagnosis not present

## 2016-06-29 DIAGNOSIS — I8291 Chronic embolism and thrombosis of unspecified vein: Secondary | ICD-10-CM | POA: Diagnosis not present

## 2016-06-29 DIAGNOSIS — E119 Type 2 diabetes mellitus without complications: Secondary | ICD-10-CM | POA: Diagnosis not present

## 2016-06-29 DIAGNOSIS — Z7901 Long term (current) use of anticoagulants: Secondary | ICD-10-CM | POA: Diagnosis not present

## 2016-06-29 DIAGNOSIS — R112 Nausea with vomiting, unspecified: Secondary | ICD-10-CM | POA: Diagnosis not present

## 2016-06-29 DIAGNOSIS — Z5181 Encounter for therapeutic drug level monitoring: Secondary | ICD-10-CM

## 2016-06-29 DIAGNOSIS — Z8679 Personal history of other diseases of the circulatory system: Secondary | ICD-10-CM

## 2016-06-29 LAB — POCT INR: INR: 1.7

## 2016-06-29 NOTE — Telephone Encounter (Signed)
INR results called in 1.7 Please call [place]  [phone]

## 2016-06-29 NOTE — Telephone Encounter (Signed)
Done.  See coumadin note. 

## 2016-07-04 DIAGNOSIS — I8291 Chronic embolism and thrombosis of unspecified vein: Secondary | ICD-10-CM | POA: Diagnosis not present

## 2016-07-04 DIAGNOSIS — I69354 Hemiplegia and hemiparesis following cerebral infarction affecting left non-dominant side: Secondary | ICD-10-CM | POA: Diagnosis not present

## 2016-07-04 DIAGNOSIS — E119 Type 2 diabetes mellitus without complications: Secondary | ICD-10-CM | POA: Diagnosis not present

## 2016-07-04 DIAGNOSIS — R112 Nausea with vomiting, unspecified: Secondary | ICD-10-CM | POA: Diagnosis not present

## 2016-07-04 DIAGNOSIS — D1803 Hemangioma of intra-abdominal structures: Secondary | ICD-10-CM | POA: Diagnosis not present

## 2016-07-04 DIAGNOSIS — Z7901 Long term (current) use of anticoagulants: Secondary | ICD-10-CM | POA: Diagnosis not present

## 2016-07-11 DIAGNOSIS — F331 Major depressive disorder, recurrent, moderate: Secondary | ICD-10-CM | POA: Diagnosis not present

## 2016-07-12 ENCOUNTER — Ambulatory Visit (INDEPENDENT_AMBULATORY_CARE_PROVIDER_SITE_OTHER): Payer: Medicare Other | Admitting: *Deleted

## 2016-07-12 DIAGNOSIS — Z5181 Encounter for therapeutic drug level monitoring: Secondary | ICD-10-CM

## 2016-07-12 DIAGNOSIS — D1803 Hemangioma of intra-abdominal structures: Secondary | ICD-10-CM | POA: Diagnosis not present

## 2016-07-12 DIAGNOSIS — Z7901 Long term (current) use of anticoagulants: Secondary | ICD-10-CM | POA: Diagnosis not present

## 2016-07-12 DIAGNOSIS — R112 Nausea with vomiting, unspecified: Secondary | ICD-10-CM | POA: Diagnosis not present

## 2016-07-12 DIAGNOSIS — I69354 Hemiplegia and hemiparesis following cerebral infarction affecting left non-dominant side: Secondary | ICD-10-CM | POA: Diagnosis not present

## 2016-07-12 DIAGNOSIS — E119 Type 2 diabetes mellitus without complications: Secondary | ICD-10-CM | POA: Diagnosis not present

## 2016-07-12 DIAGNOSIS — I8291 Chronic embolism and thrombosis of unspecified vein: Secondary | ICD-10-CM | POA: Diagnosis not present

## 2016-07-12 DIAGNOSIS — Z8679 Personal history of other diseases of the circulatory system: Secondary | ICD-10-CM

## 2016-07-12 LAB — POCT INR: INR: 2

## 2016-07-12 MED ORDER — WARFARIN SODIUM 5 MG PO TABS: ORAL_TABLET | ORAL | 3 refills | Status: DC

## 2016-07-31 ENCOUNTER — Ambulatory Visit (INDEPENDENT_AMBULATORY_CARE_PROVIDER_SITE_OTHER): Payer: Medicare Other | Admitting: *Deleted

## 2016-07-31 DIAGNOSIS — Z7901 Long term (current) use of anticoagulants: Secondary | ICD-10-CM | POA: Diagnosis not present

## 2016-07-31 DIAGNOSIS — M79675 Pain in left toe(s): Secondary | ICD-10-CM | POA: Diagnosis not present

## 2016-07-31 DIAGNOSIS — I82409 Acute embolism and thrombosis of unspecified deep veins of unspecified lower extremity: Secondary | ICD-10-CM | POA: Diagnosis not present

## 2016-07-31 DIAGNOSIS — B351 Tinea unguium: Secondary | ICD-10-CM | POA: Diagnosis not present

## 2016-07-31 DIAGNOSIS — Z8679 Personal history of other diseases of the circulatory system: Secondary | ICD-10-CM | POA: Diagnosis not present

## 2016-07-31 DIAGNOSIS — Z5181 Encounter for therapeutic drug level monitoring: Secondary | ICD-10-CM

## 2016-07-31 DIAGNOSIS — M79674 Pain in right toe(s): Secondary | ICD-10-CM | POA: Diagnosis not present

## 2016-07-31 LAB — POCT INR: INR: 2.1

## 2016-08-08 DIAGNOSIS — E1165 Type 2 diabetes mellitus with hyperglycemia: Secondary | ICD-10-CM | POA: Diagnosis not present

## 2016-08-08 DIAGNOSIS — I69354 Hemiplegia and hemiparesis following cerebral infarction affecting left non-dominant side: Secondary | ICD-10-CM | POA: Diagnosis not present

## 2016-08-08 DIAGNOSIS — I82502 Chronic embolism and thrombosis of unspecified deep veins of left lower extremity: Secondary | ICD-10-CM | POA: Diagnosis not present

## 2016-08-08 DIAGNOSIS — I10 Essential (primary) hypertension: Secondary | ICD-10-CM | POA: Diagnosis not present

## 2016-08-08 DIAGNOSIS — F333 Major depressive disorder, recurrent, severe with psychotic symptoms: Secondary | ICD-10-CM | POA: Diagnosis not present

## 2016-08-08 DIAGNOSIS — R2689 Other abnormalities of gait and mobility: Secondary | ICD-10-CM | POA: Diagnosis not present

## 2016-08-15 DIAGNOSIS — E1165 Type 2 diabetes mellitus with hyperglycemia: Secondary | ICD-10-CM | POA: Diagnosis not present

## 2016-08-15 DIAGNOSIS — I10 Essential (primary) hypertension: Secondary | ICD-10-CM | POA: Diagnosis not present

## 2016-08-15 DIAGNOSIS — I69354 Hemiplegia and hemiparesis following cerebral infarction affecting left non-dominant side: Secondary | ICD-10-CM | POA: Diagnosis not present

## 2016-08-15 DIAGNOSIS — F333 Major depressive disorder, recurrent, severe with psychotic symptoms: Secondary | ICD-10-CM | POA: Diagnosis not present

## 2016-08-15 DIAGNOSIS — R2689 Other abnormalities of gait and mobility: Secondary | ICD-10-CM | POA: Diagnosis not present

## 2016-08-15 DIAGNOSIS — I82502 Chronic embolism and thrombosis of unspecified deep veins of left lower extremity: Secondary | ICD-10-CM | POA: Diagnosis not present

## 2016-08-16 ENCOUNTER — Ambulatory Visit: Payer: Medicare Other | Admitting: Nurse Practitioner

## 2016-08-16 ENCOUNTER — Telehealth: Payer: Self-pay | Admitting: Nurse Practitioner

## 2016-08-16 ENCOUNTER — Encounter: Payer: Self-pay | Admitting: Nurse Practitioner

## 2016-08-16 DIAGNOSIS — K5909 Other constipation: Secondary | ICD-10-CM | POA: Diagnosis not present

## 2016-08-16 DIAGNOSIS — E1165 Type 2 diabetes mellitus with hyperglycemia: Secondary | ICD-10-CM | POA: Diagnosis not present

## 2016-08-16 NOTE — Telephone Encounter (Signed)
Patient was a no show and letter sent  °

## 2016-08-16 NOTE — Telephone Encounter (Signed)
Noted  

## 2016-08-17 DIAGNOSIS — I10 Essential (primary) hypertension: Secondary | ICD-10-CM | POA: Diagnosis not present

## 2016-08-17 DIAGNOSIS — E1165 Type 2 diabetes mellitus with hyperglycemia: Secondary | ICD-10-CM | POA: Diagnosis not present

## 2016-08-17 DIAGNOSIS — I69354 Hemiplegia and hemiparesis following cerebral infarction affecting left non-dominant side: Secondary | ICD-10-CM | POA: Diagnosis not present

## 2016-08-17 DIAGNOSIS — F333 Major depressive disorder, recurrent, severe with psychotic symptoms: Secondary | ICD-10-CM | POA: Diagnosis not present

## 2016-08-17 DIAGNOSIS — R2689 Other abnormalities of gait and mobility: Secondary | ICD-10-CM | POA: Diagnosis not present

## 2016-08-17 DIAGNOSIS — I82502 Chronic embolism and thrombosis of unspecified deep veins of left lower extremity: Secondary | ICD-10-CM | POA: Diagnosis not present

## 2016-08-22 DIAGNOSIS — I82502 Chronic embolism and thrombosis of unspecified deep veins of left lower extremity: Secondary | ICD-10-CM | POA: Diagnosis not present

## 2016-08-22 DIAGNOSIS — R2689 Other abnormalities of gait and mobility: Secondary | ICD-10-CM | POA: Diagnosis not present

## 2016-08-22 DIAGNOSIS — I10 Essential (primary) hypertension: Secondary | ICD-10-CM | POA: Diagnosis not present

## 2016-08-22 DIAGNOSIS — E1165 Type 2 diabetes mellitus with hyperglycemia: Secondary | ICD-10-CM | POA: Diagnosis not present

## 2016-08-22 DIAGNOSIS — F333 Major depressive disorder, recurrent, severe with psychotic symptoms: Secondary | ICD-10-CM | POA: Diagnosis not present

## 2016-08-22 DIAGNOSIS — I69354 Hemiplegia and hemiparesis following cerebral infarction affecting left non-dominant side: Secondary | ICD-10-CM | POA: Diagnosis not present

## 2016-08-24 DIAGNOSIS — I10 Essential (primary) hypertension: Secondary | ICD-10-CM | POA: Diagnosis not present

## 2016-08-24 DIAGNOSIS — E1165 Type 2 diabetes mellitus with hyperglycemia: Secondary | ICD-10-CM | POA: Diagnosis not present

## 2016-08-24 DIAGNOSIS — I82502 Chronic embolism and thrombosis of unspecified deep veins of left lower extremity: Secondary | ICD-10-CM | POA: Diagnosis not present

## 2016-08-24 DIAGNOSIS — I69354 Hemiplegia and hemiparesis following cerebral infarction affecting left non-dominant side: Secondary | ICD-10-CM | POA: Diagnosis not present

## 2016-08-24 DIAGNOSIS — R2689 Other abnormalities of gait and mobility: Secondary | ICD-10-CM | POA: Diagnosis not present

## 2016-08-24 DIAGNOSIS — F333 Major depressive disorder, recurrent, severe with psychotic symptoms: Secondary | ICD-10-CM | POA: Diagnosis not present

## 2016-08-28 ENCOUNTER — Ambulatory Visit (INDEPENDENT_AMBULATORY_CARE_PROVIDER_SITE_OTHER): Payer: Medicare Other | Admitting: *Deleted

## 2016-08-28 DIAGNOSIS — Z5181 Encounter for therapeutic drug level monitoring: Secondary | ICD-10-CM

## 2016-08-28 DIAGNOSIS — Z7901 Long term (current) use of anticoagulants: Secondary | ICD-10-CM | POA: Diagnosis not present

## 2016-08-28 DIAGNOSIS — I82409 Acute embolism and thrombosis of unspecified deep veins of unspecified lower extremity: Secondary | ICD-10-CM | POA: Diagnosis not present

## 2016-08-28 DIAGNOSIS — Z8679 Personal history of other diseases of the circulatory system: Secondary | ICD-10-CM

## 2016-08-28 LAB — POCT INR: INR: 2.3

## 2016-08-29 DIAGNOSIS — E1165 Type 2 diabetes mellitus with hyperglycemia: Secondary | ICD-10-CM | POA: Diagnosis not present

## 2016-08-29 DIAGNOSIS — I69354 Hemiplegia and hemiparesis following cerebral infarction affecting left non-dominant side: Secondary | ICD-10-CM | POA: Diagnosis not present

## 2016-08-29 DIAGNOSIS — F333 Major depressive disorder, recurrent, severe with psychotic symptoms: Secondary | ICD-10-CM | POA: Diagnosis not present

## 2016-08-29 DIAGNOSIS — R2689 Other abnormalities of gait and mobility: Secondary | ICD-10-CM | POA: Diagnosis not present

## 2016-08-29 DIAGNOSIS — I82502 Chronic embolism and thrombosis of unspecified deep veins of left lower extremity: Secondary | ICD-10-CM | POA: Diagnosis not present

## 2016-08-29 DIAGNOSIS — I10 Essential (primary) hypertension: Secondary | ICD-10-CM | POA: Diagnosis not present

## 2016-08-31 DIAGNOSIS — I82502 Chronic embolism and thrombosis of unspecified deep veins of left lower extremity: Secondary | ICD-10-CM | POA: Diagnosis not present

## 2016-08-31 DIAGNOSIS — F333 Major depressive disorder, recurrent, severe with psychotic symptoms: Secondary | ICD-10-CM | POA: Diagnosis not present

## 2016-08-31 DIAGNOSIS — R2689 Other abnormalities of gait and mobility: Secondary | ICD-10-CM | POA: Diagnosis not present

## 2016-08-31 DIAGNOSIS — E1165 Type 2 diabetes mellitus with hyperglycemia: Secondary | ICD-10-CM | POA: Diagnosis not present

## 2016-08-31 DIAGNOSIS — I69354 Hemiplegia and hemiparesis following cerebral infarction affecting left non-dominant side: Secondary | ICD-10-CM | POA: Diagnosis not present

## 2016-08-31 DIAGNOSIS — I10 Essential (primary) hypertension: Secondary | ICD-10-CM | POA: Diagnosis not present

## 2016-09-05 DIAGNOSIS — I82502 Chronic embolism and thrombosis of unspecified deep veins of left lower extremity: Secondary | ICD-10-CM | POA: Diagnosis not present

## 2016-09-05 DIAGNOSIS — I69354 Hemiplegia and hemiparesis following cerebral infarction affecting left non-dominant side: Secondary | ICD-10-CM | POA: Diagnosis not present

## 2016-09-05 DIAGNOSIS — R2689 Other abnormalities of gait and mobility: Secondary | ICD-10-CM | POA: Diagnosis not present

## 2016-09-05 DIAGNOSIS — F333 Major depressive disorder, recurrent, severe with psychotic symptoms: Secondary | ICD-10-CM | POA: Diagnosis not present

## 2016-09-05 DIAGNOSIS — E1165 Type 2 diabetes mellitus with hyperglycemia: Secondary | ICD-10-CM | POA: Diagnosis not present

## 2016-09-05 DIAGNOSIS — I10 Essential (primary) hypertension: Secondary | ICD-10-CM | POA: Diagnosis not present

## 2016-09-06 ENCOUNTER — Other Ambulatory Visit: Payer: Self-pay | Admitting: "Endocrinology

## 2016-09-06 DIAGNOSIS — G8190 Hemiplegia, unspecified affecting unspecified side: Secondary | ICD-10-CM | POA: Diagnosis not present

## 2016-09-06 DIAGNOSIS — E1159 Type 2 diabetes mellitus with other circulatory complications: Secondary | ICD-10-CM | POA: Diagnosis not present

## 2016-09-07 LAB — COMPREHENSIVE METABOLIC PANEL
ALT: 11 IU/L (ref 0–32)
AST: 10 IU/L (ref 0–40)
Albumin/Globulin Ratio: 1.5 (ref 1.2–2.2)
Albumin: 4.4 g/dL (ref 3.5–4.8)
Alkaline Phosphatase: 90 IU/L (ref 39–117)
BUN/Creatinine Ratio: 13 (ref 12–28)
BUN: 10 mg/dL (ref 8–27)
Bilirubin Total: 0.3 mg/dL (ref 0.0–1.2)
CO2: 27 mmol/L (ref 20–29)
Calcium: 9.1 mg/dL (ref 8.7–10.3)
Chloride: 100 mmol/L (ref 96–106)
Creatinine, Ser: 0.78 mg/dL (ref 0.57–1.00)
GFR calc Af Amer: 88 mL/min/{1.73_m2} (ref >59)
GFR calc non Af Amer: 77 mL/min/{1.73_m2} (ref >59)
Globulin, Total: 3 g/dL (ref 1.5–4.5)
Glucose: 122 mg/dL — ABNORMAL HIGH (ref 65–99)
Potassium: 4.3 mmol/L (ref 3.5–5.2)
Sodium: 141 mmol/L (ref 134–144)
Total Protein: 7.4 g/dL (ref 6.0–8.5)

## 2016-09-07 LAB — AMBIG ABBREV CMP14 DEFAULT

## 2016-09-07 LAB — HGB A1C W/O EAG: Hgb A1c MFr Bld: 6.9 % — ABNORMAL HIGH (ref 4.8–5.6)

## 2016-09-08 DIAGNOSIS — F333 Major depressive disorder, recurrent, severe with psychotic symptoms: Secondary | ICD-10-CM | POA: Diagnosis not present

## 2016-09-08 DIAGNOSIS — E1165 Type 2 diabetes mellitus with hyperglycemia: Secondary | ICD-10-CM | POA: Diagnosis not present

## 2016-09-08 DIAGNOSIS — I82502 Chronic embolism and thrombosis of unspecified deep veins of left lower extremity: Secondary | ICD-10-CM | POA: Diagnosis not present

## 2016-09-08 DIAGNOSIS — R2689 Other abnormalities of gait and mobility: Secondary | ICD-10-CM | POA: Diagnosis not present

## 2016-09-08 DIAGNOSIS — I10 Essential (primary) hypertension: Secondary | ICD-10-CM | POA: Diagnosis not present

## 2016-09-08 DIAGNOSIS — I69354 Hemiplegia and hemiparesis following cerebral infarction affecting left non-dominant side: Secondary | ICD-10-CM | POA: Diagnosis not present

## 2016-09-12 DIAGNOSIS — F333 Major depressive disorder, recurrent, severe with psychotic symptoms: Secondary | ICD-10-CM | POA: Diagnosis not present

## 2016-09-12 DIAGNOSIS — R2689 Other abnormalities of gait and mobility: Secondary | ICD-10-CM | POA: Diagnosis not present

## 2016-09-12 DIAGNOSIS — I69354 Hemiplegia and hemiparesis following cerebral infarction affecting left non-dominant side: Secondary | ICD-10-CM | POA: Diagnosis not present

## 2016-09-12 DIAGNOSIS — I10 Essential (primary) hypertension: Secondary | ICD-10-CM | POA: Diagnosis not present

## 2016-09-12 DIAGNOSIS — I82502 Chronic embolism and thrombosis of unspecified deep veins of left lower extremity: Secondary | ICD-10-CM | POA: Diagnosis not present

## 2016-09-12 DIAGNOSIS — E1165 Type 2 diabetes mellitus with hyperglycemia: Secondary | ICD-10-CM | POA: Diagnosis not present

## 2016-09-13 ENCOUNTER — Encounter: Payer: Self-pay | Admitting: "Endocrinology

## 2016-09-13 ENCOUNTER — Ambulatory Visit (INDEPENDENT_AMBULATORY_CARE_PROVIDER_SITE_OTHER): Payer: Medicare Other | Admitting: "Endocrinology

## 2016-09-13 VITALS — BP 118/70 | HR 62 | Ht 62.0 in

## 2016-09-13 DIAGNOSIS — Z9189 Other specified personal risk factors, not elsewhere classified: Secondary | ICD-10-CM | POA: Diagnosis not present

## 2016-09-13 DIAGNOSIS — E1159 Type 2 diabetes mellitus with other circulatory complications: Secondary | ICD-10-CM

## 2016-09-13 DIAGNOSIS — E782 Mixed hyperlipidemia: Secondary | ICD-10-CM

## 2016-09-13 DIAGNOSIS — I10 Essential (primary) hypertension: Secondary | ICD-10-CM | POA: Diagnosis not present

## 2016-09-13 MED ORDER — INSULIN DETEMIR 100 UNIT/ML FLEXPEN: PEN_INJECTOR | SUBCUTANEOUS | 2 refills | Status: DC

## 2016-09-13 NOTE — Progress Notes (Signed)
Subjective:    Patient ID: [name], female    DOB: [birth year],    Past Medical History:  Diagnosis Date  . Anxiety   . Arthritis   . Depression    History of psychosis and previous suicide attempt  . DVT, lower extremity, recurrent (HCC)    Long-term Coumadin per Dr. Legrand Rams  . Essential hypertension   . GERD (gastroesophageal reflux disease)   . Hemiplegia ([place]) 2010   Left side  . History of stroke    Acute infarct and right cerebral white matter small vessel disease 12/10  . Leg DVT (deep venous thromboembolism), acute ([place]) 2006  . Schizophrenia ([place])   . Stroke [facility])    left sided weakness  . Type 2 diabetes mellitus ([place])    Past Surgical History:  Procedure Laterality Date  . BACK SURGERY    . BIOPSY N/A 11/24/2014   Procedure: BIOPSY;  Surgeon: Danie Binder, MD;  Location: [facility] ORS;  Service: Endoscopy;  Laterality: N/A;  . BIOPSY  05/17/2016   Procedure: BIOPSY;  Surgeon: Danie Binder, MD;  Location: [facility] END[facility] SUITE;  Service: Endoscopy;;  gastric biopsy  . COLONOSCOPY WITH PROPOFOL N/A 11/24/2014   Dr. Rudie Meyer polyps removed/moderate sized internal hemorrhoids, tubular adenomas. Next surveillance in 3 years  . ESOPHAGOGASTRODUODENOSCOPY (EGD) WITH PROPOFOL N/A 11/24/2014   Dr. Clayburn Pert HH/patent stricture at the gastroesophageal junction, mild non-erosive gastritis, path negative for H.pylori or celiac sprue  . ESOPHAGOGASTRODUODENOSCOPY (EGD) WITH PROPOFOL N/A 05/17/2016   Procedure: ESOPHAGOGASTRODUODENOSCOPY (EGD) WITH PROPOFOL;  Surgeon: Danie Binder, MD;  Location: [facility];  Service: Endoscopy;  Laterality: N/A;  12:45pm  . GIVENS CAPSULE STUDY N/A 12/11/2014   MULTILPLE EROSION IN the stomach WITH ACTIVE OOZING. OCCASIONAL EROSIONS AND RARE ULCER SEEN IN PROXIMAL SMALL BOWEL . No masses or AVMs SEEN. NO OLD BLOOD OR FRESH BLOOD SEEN.   . POLYPECTOMY N/A 11/24/2014   Procedure: POLYPECTOMY;  Surgeon: Danie Binder, MD;  Location: [facility];   Service: Endoscopy;  Laterality: N/A;  . SAVORY DILATION N/A 05/17/2016   Procedure: SAVORY DILATION;  Surgeon: Danie Binder, MD;  Location: [facility];  Service: Endoscopy;  Laterality: N/A;   Social History   Social History  . Marital status: Widowed    Spouse name: N/A  . Number of children: N/A  . Years of education: N/A   Social History Main Topics  . Smoking status: Former Smoker    Packs/day: 0.25    Years: 20.00    Types: Cigarettes    Quit date: 01/24/1995  . Smokeless tobacco: Never Used  . Alcohol use No  . Drug use: No  . Sexual activity: No   Other Topics Concern  . None   Social History Narrative  . None   Outpatient Encounter Prescriptions as of 09/13/2016  Medication Sig  . acetaminophen (TYLENOL) 325 MG tablet Take 650 mg by mouth every 6 (six) hours as needed. For fever > 101   . ARIPiprazole (ABILIFY) 2 MG tablet Take 2 mg by mouth daily.   . calcium carbonate (TUMS - DOSED IN MG ELEMENTAL CALCIUM) 500 MG chewable tablet Chew 1 tablet by mouth 4 (four) times daily as needed for indigestion or heartburn.   . clonazePAM (KLONOPIN) 1 MG tablet Take 1 mg by mouth 3 (three) times daily.   . DULoxetine (CYMBALTA) 60 MG capsule Take 120 mg by mouth daily.   Marland Kitchen HYDROcodone-acetaminophen (NORCO) 5-325 MG per tablet  Take 1 tablet by mouth 3 (three) times daily.   . hydrocortisone (ANUSOL-HC) 2.5 % rectal cream Place 1 application rectally 2 (two) times daily.  . Insulin Aspart (NOVOLOG FLEXPEN [place]) Inject 15-21 Units into the skin 3 (three) times daily before meals.  . Insulin Detemir (LEVEMIR FLEXTOUCH) 100 UNIT/ML Pen INJECT 50 UNITS SUBCUTANEOUSLY AT BEDTIME.  . iron polysaccharides (NIFEREX) 150 MG capsule Take 1 capsule (150 mg total) by mouth daily.  Marland Kitchen latanoprost (XALATAN) 0.005 % ophthalmic solution Place 1 drop into both eyes at bedtime.  Marland Kitchen loperamide (IMODIUM) 2 MG capsule Take 4 mg by mouth daily as needed for diarrhea or loose stools. Take 2 capsules  initially then take 2 after each loose stool per MAR  . metoCLOPramide (REGLAN) 5 MG tablet Take 1 tablet (5 mg total) by mouth every 8 (eight) hours as needed for nausea or vomiting (or bloating).  . metoprolol succinate (TOPROL-XL) 25 MG 24 hr tablet Take 12.5 mg by mouth 2 (two) times daily.   . mirtazapine (REMERON) 30 MG tablet Take 30 mg by mouth at bedtime.    . naloxegol oxalate (MOVANTIK) 12.5 MG TABS tablet Take 1 tablet (12.5 mg total) by mouth daily.  Marland Kitchen omeprazole (PRILOSEC) 40 MG capsule Take 40 mg by mouth daily.  . ondansetron (ZOFRAN) 4 MG tablet Take 1 tablet (4 mg total) by mouth 4 (four) times daily -  before meals and at bedtime.  . polyethylene glycol powder (GLYCOLAX/MIRALAX) powder Take 17 g by mouth daily as needed.  . polyvinyl alcohol (ARTIFICIAL TEARS) 1.4 % ophthalmic solution Place 1 drop into both eyes 3 (three) times daily.  . promethazine (PHENERGAN) 12.5 MG tablet TAKE 1 TABLET BY MOUTH EVERY 6 HOURS AS NEEDED FOR NAUSEA OR VOMITING.  . risperiDONE (RISPERDAL) 3 MG tablet Take 3 mg by mouth 2 (two) times daily.   . simvastatin (ZOCOR) 20 MG tablet Take 20 mg by mouth at bedtime.   . solifenacin (VESICARE) 5 MG tablet Take 5 mg by mouth daily.  . solifenacin (VESICARE) 5 MG tablet Take 5 mg by mouth daily.  Marland Kitchen tiZANidine (ZANAFLEX) 2 MG tablet Take 2 mg by mouth daily.  . Vitamin D, Ergocalciferol, (DRISDOL) 50000 UNITS CAPS capsule Take 50,000 Units by mouth every Friday.   . warfarin (COUMADIN) 5 MG tablet TAKE 1 TABLET BY MOUTH ONCE DAILY ON SUNDAY,TUESDAY,THURSDAY & SATURDAY. TAKE 1/2 TABLET (2.5mg ) ON [place].  Recheck INR on 07/31/16  . zolpidem (AMBIEN) 10 MG tablet Take 10 mg by mouth at bedtime.  . [DISCONTINUED] insulin aspart (NOVOLOG FLEXPEN) 100 UNIT/ML FlexPen INJECT 20 UNITS SUBCUTANEOUSLY BEFORE MEALS IF BS IS BETWEEN 90-150. 150-200 ADD 1 UNIT , 201-250 ADD 2 UNITS  . [DISCONTINUED] LEVEMIR FLEXTOUCH 100 UNIT/ML Pen INJECT 60 UNITS  SUBCUTANEOUSLY AT BEDTIME.  . [DISCONTINUED] NOVOLOG FLEXPEN 100 UNIT/ML FlexPen INJECT 20 UNITS SUBCUTANEOUSLY BEFORE MEALS IF BS IS BETWEEN 90-150.  . [DISCONTINUED] NOVOLOG FLEXPEN 100 UNIT/ML FlexPen SS: 151-200=1 UNIT;201-250=2 UNITS;251-300 ADD 3 UNITS; 301-350 ADD 4UNITS; 351-400 ADD 5 UNITS; 401-450 ADD 6 UNITS AND CALL DOCTOR. UNITS  . enoxaparin (LOVENOX) 100 MG/ML injection Inject 1 mL (100 mg total) into the skin daily. (Patient taking differently: Inject 100 mg into the skin daily. Starting on 05/04/2016-stopping on 06/07/2016)   No facility-administered encounter medications on file as of 09/13/2016.    ALLERGIES: Allergies  Allergen Reactions  . Sulfa Antibiotics Rash  . Sulfonamide Derivatives Rash    REACTION: rash   VACCINATION  STATUS:  There is no immunization history on file for this patient.  Diabetes  She presents for her follow-up diabetic visit. She has type 2 diabetes mellitus. Onset time: She was diagnosed at approximate age of 83 years. Her disease course has been improving. There are no hypoglycemic associated symptoms. Pertinent negatives for hypoglycemia include no confusion, pallor or seizures. Pertinent negatives for diabetes include no polydipsia, no polyphagia and no polyuria. There are no hypoglycemic complications. Symptoms are improving. Diabetic complications include a CVA. Risk factors for coronary artery disease include dyslipidemia, diabetes mellitus, obesity, sedentary lifestyle and hypertension. Current diabetic treatment includes intensive insulin program and oral agent (monotherapy) (She kept requiring large dose of insulin due to consumption of large quantities of processed carbohydrates.Marland Kitchen). Her weight is stable. She is following a generally unhealthy diet. When asked about meal planning, she reported none. She never participates in exercise. Her home blood glucose trend is decreasing steadily. Her breakfast blood glucose range is generally 140-180 mg/dl.  Her lunch blood glucose range is generally 140-180 mg/dl. Her dinner blood glucose range is generally 140-180 mg/dl. Her overall blood glucose range is 140-180 mg/dl. An ACE inhibitor/angiotensin II receptor blocker is being taken.  Hypertension  This is a chronic problem. The current episode started more than 1 year ago. The problem is controlled. Pertinent negatives include no palpitations or shortness of breath. Risk factors for coronary artery disease include dyslipidemia and diabetes mellitus. Hypertensive end-organ damage includes CVA.  Hyperlipidemia  This is a chronic problem. The current episode started more than 1 year ago. Exacerbating diseases include diabetes. Pertinent negatives include no shortness of breath. Risk factors for coronary artery disease include diabetes mellitus, dyslipidemia, hypertension, obesity and a sedentary lifestyle.     Review of Systems  Constitutional: Negative for unexpected weight change.       Uses walker to get around.   HENT: Negative for trouble swallowing and voice change.   Eyes: Negative for visual disturbance.  Respiratory: Negative for shortness of breath and wheezing.   Cardiovascular: Negative for palpitations and leg swelling.  Gastrointestinal: Negative for diarrhea.  Endocrine: Negative for cold intolerance, heat intolerance, polydipsia, polyphagia and polyuria.  Skin: Negative for color change, pallor and wound.  Neurological: Negative for seizures.  Psychiatric/Behavioral: Negative for confusion and suicidal ideas.    Objective:    BP 118/70   Pulse 62   Ht 5\' 2"  (1.575 m)   Wt Readings from Last 3 Encounters:  05/23/16 159 lb (72.1 kg)  05/16/16 157 lb (71.2 kg)  04/20/16 159 lb 6.4 oz (72.3 kg)    Physical Exam  Constitutional: She is oriented to person, place, and time. She appears well-developed.  HENT:  Head: Normocephalic and atraumatic.  Eyes: EOM are normal.  Neck: Normal range of motion. Neck supple. No tracheal  deviation present. No thyromegaly present.  Cardiovascular: Normal rate and regular rhythm.   Pulmonary/Chest: Effort normal and breath sounds normal.  Abdominal: Soft. Bowel sounds are normal. There is no tenderness. There is no guarding.  Musculoskeletal: Normal range of motion. She exhibits no edema.  Neurological: She is alert and oriented to person, place, and time. She has normal reflexes.  She uses walker to get around.  Skin: Skin is warm and dry. No rash noted. No erythema. No pallor.  Psychiatric: She has a normal mood and affect. Judgment normal.    Results for orders placed or performed in visit on 09/06/16  Comprehensive metabolic panel  Result Value Ref Range  Glucose 122 (H) 65 - 99 mg/dL   BUN 10 8 - 27 mg/dL   Creatinine, Ser 0.78 0.57 - 1.00 mg/dL   GFR calc non Af Amer 77 >59 mL/min/1.73   GFR calc Af Amer 88 >59 mL/min/1.73   BUN/Creatinine Ratio 13 12 - 28   Sodium 141 134 - 144 mmol/L   Potassium 4.3 3.5 - 5.2 mmol/L   Chloride 100 96 - 106 mmol/L   CO2 27 20 - 29 mmol/L   Calcium 9.1 8.7 - 10.3 mg/dL   Total Protein 7.4 6.0 - 8.5 g/dL   Albumin 4.4 3.5 - 4.8 g/dL   Globulin, Total 3.0 1.5 - 4.5 g/dL   Albumin/Globulin Ratio 1.5 1.2 - 2.2   Bilirubin Total 0.3 0.0 - 1.2 mg/dL   Alkaline Phosphatase 90 39 - 117 IU/L   AST 10 0 - 40 IU/L   ALT 11 0 - 32 IU/L  Hgb A1c w/o eAG  Result Value Ref Range   Hgb A1c MFr Bld 6.9 (H) 4.8 - 5.6 %  Ambig Abbrev CMP14 Default  Result Value Ref Range   Ambig Abbrev CMP14 Default Comment    Complete Blood Count (Most recent): Lab Results  Component Value Date   WBC 8.8 05/23/2016   HGB 13.3 05/23/2016   HCT 39.2 05/23/2016   MCV 90.3 05/23/2016   PLT 248 05/23/2016   Chemistry (most recent): Lab Results  Component Value Date   NA 141 09/06/2016   K 4.3 09/06/2016   CL 100 09/06/2016   CO2 27 09/06/2016   BUN 10 09/06/2016   CREATININE 0.78 09/06/2016   Diabetic Labs (most recent): Lab Results   Component Value Date   HGBA1C 6.9 (H) 09/06/2016   HGBA1C 6.4 (H) 05/31/2016   HGBA1C 6.9 (H) 02/24/2016   Lipid Panel     Component Value Date/Time   CHOL (H) 01/03/2009 0630    210        ATP III CLASSIFICATION:  <200     mg/dL   Desirable  200-239  mg/dL   Borderline High  >=240    mg/dL   High          TRIG 161 (H) 01/03/2009 0630   HDL 35 (L) 01/03/2009 0630   CHOLHDL 6.0 01/03/2009 0630   VLDL 32 01/03/2009 0630   LDLCALC (H) 01/03/2009 0630    143        Total Cholesterol/HDL:CHD Risk Coronary Heart Disease Risk Table                     Men   Women  1/2 Average Risk   3.4   3.3  Average Risk       5.0   4.4  2 X Average Risk   9.6   7.1  3 X Average Risk  23.4   11.0        Use the calculated Patient Ratio above and the CHD Risk Table to determine the patient's CHD Risk.        ATP III CLASSIFICATION (LDL):  <100     mg/dL   Optimal  100-129  mg/dL   Near or Above                    Optimal  130-159  mg/dL   Borderline  160-189  mg/dL   High  >190     mg/dL   Very High      Assessment & Plan:  1. Type 2 diabetes mellitus with vascular disease ([place]) Her diabetes is  complicated by recurrent CVA. Patient came with improved glucose profile,  Did have some random hypoglycemia due to poor appetite. A1c remains fair at 6.9%, slowly improving from 8.2%.    Recent labs reviewed. - Patient remains at a high risk for more acute and chronic complications of diabetes which include CAD, CVA, CKD, retinopathy, and neuropathy. These are all discussed in detail with the patient.  - I have re-counseled the patient on diet management and weight loss  by adopting a carbohydrate restricted / protein rich  Diet. - Patient is advised to stick to a routine mealtimes to eat 3 meals  a day and avoid unnecessary snacks ( to snack only to correct hypoglycemia).  Suggestion is made for her to avoid simple carbohydrates  from her diet including Cakes, Sweet Desserts, Ice Cream,  Soda (diet and regular), Sweet Tea, Candies, Chips, Cookies, Store Bought Juices, Alcohol in Excess of  1-2 drinks a day, Artificial Sweeteners, and "Sugar-free" Products. This will help patient to have stable blood glucose profile and potentially avoid unintended weight gain.  - I have approached patient with the following individualized plan to manage diabetes and patient agrees.  - Lower Levemir to 50 units  daily at bedtime, lower NovoLog to 15 units 3 times a day before meals for premeal blood glucose readings of 90-150mg /dl, plus patient specific sliding scale insulin for correction of unexpected hyperglycemia above 150mg /dl, associated with strict monitoring of blood glucose 4 times a day-before meals and at bedtime.   -Adjustment parameters for hypo and hyperglycemia were given in a written document to patient. -Patient is encouraged to call clinic for blood glucose levels less than 70 or above 300 mg /dl.  - she did not tolerate Metformin. - Patient specific target  for A1c; LDL, HDL, Triglycerides, and  Waist Circumference were discussed in detail.  2) BP/HTN: Controlled. Continue current medications including ACEI. She is only on very low-dose metoprolol and very low-dose lisinopril. If she becomes symptomatic of hypotension, one of these medications should be stopped.  3) Lipids/HPL: continue statins. 4)  Weight/Diet:  exercise, and carbohydrates information provided.  5) Chronic Care/Health Maintenance:  -Patient is  on ACEI and Statin medications and encouraged to continue to follow up with Ophthalmology, Podiatrist at least yearly or according to recommendations, and advised to  stay away from smoking. I have recommended yearly flu vaccine and pneumonia vaccination at least every 5 years; and  sleep for at least 7 hours a day.  I advised patient to maintain close follow up with her PCP for primary care needs.  Patient is asked to bring meter and  blood glucose logs during her  next visit.  - Time spent with the patient: 25 min, of which >50% was spent in reviewing her sugar logs , discussing her hypo- and hyper-glycemic episodes, reviewing  previous labs and insulin doses and developing a plan to avoid hypo- and hyper-glycemia.   Follow up plan: Return in about 3 months (around 12/14/2016) for meter, and logs.  Glade Lloyd, MD Phone: [phone]  Fax: [phone]   This note was partially dictated with voice recognition software. Similar sounding words can be transcribed inadequately or may not  be corrected upon review.  09/13/2016, 9:58 AM

## 2016-09-13 NOTE — Patient Instructions (Signed)
Advice for Weight Management -For most of us the best way to lose weight is by diet management. Generally speaking, diet management means restricting carbohydrate consumption to minimum possible (and to unprocessed or minimally processed complex starch) and increasing protein intake (animal or plant source), fruits, and vegetables.  -Sticking to a routine mealtime to eat 3 meals a day and avoiding unnecessary snacks is shown to have a big role in weight control.  -It is better to avoid simple carbohydrates including: Cakes, Sweet Desserts, Ice Cream, Soda (diet and regular), Sweet Tea, Candies, Chips, Cookies, Store Bought Juices, Alcohol in Excess of  1-2 drinks a day, Artificial Sweeteners, and "Sugar-free" Products.   -Exercise: 30 -60 minutes a day 3-4 days a week, or 150 minutes a week. Combine stretch, strength, and aerobic activities. You may seek evaluation by your heart doctor prior to initiating exercise if you have high risk for heart disease.  -If you are interested, we can schedule a visit with [name], RDN, CDE for individualized nutrition education.  

## 2016-09-14 DIAGNOSIS — F333 Major depressive disorder, recurrent, severe with psychotic symptoms: Secondary | ICD-10-CM | POA: Diagnosis not present

## 2016-09-14 DIAGNOSIS — R2689 Other abnormalities of gait and mobility: Secondary | ICD-10-CM | POA: Diagnosis not present

## 2016-09-14 DIAGNOSIS — I82502 Chronic embolism and thrombosis of unspecified deep veins of left lower extremity: Secondary | ICD-10-CM | POA: Diagnosis not present

## 2016-09-14 DIAGNOSIS — I10 Essential (primary) hypertension: Secondary | ICD-10-CM | POA: Diagnosis not present

## 2016-09-14 DIAGNOSIS — I69354 Hemiplegia and hemiparesis following cerebral infarction affecting left non-dominant side: Secondary | ICD-10-CM | POA: Diagnosis not present

## 2016-09-14 DIAGNOSIS — E1165 Type 2 diabetes mellitus with hyperglycemia: Secondary | ICD-10-CM | POA: Diagnosis not present

## 2016-09-15 ENCOUNTER — Other Ambulatory Visit: Payer: Self-pay | Admitting: Cardiology

## 2016-09-16 DIAGNOSIS — G8190 Hemiplegia, unspecified affecting unspecified side: Secondary | ICD-10-CM | POA: Diagnosis not present

## 2016-09-16 DIAGNOSIS — I10 Essential (primary) hypertension: Secondary | ICD-10-CM | POA: Diagnosis not present

## 2016-09-16 DIAGNOSIS — E1165 Type 2 diabetes mellitus with hyperglycemia: Secondary | ICD-10-CM | POA: Diagnosis not present

## 2016-09-19 DIAGNOSIS — F333 Major depressive disorder, recurrent, severe with psychotic symptoms: Secondary | ICD-10-CM | POA: Diagnosis not present

## 2016-09-19 DIAGNOSIS — I69354 Hemiplegia and hemiparesis following cerebral infarction affecting left non-dominant side: Secondary | ICD-10-CM | POA: Diagnosis not present

## 2016-09-19 DIAGNOSIS — R2689 Other abnormalities of gait and mobility: Secondary | ICD-10-CM | POA: Diagnosis not present

## 2016-09-19 DIAGNOSIS — E1165 Type 2 diabetes mellitus with hyperglycemia: Secondary | ICD-10-CM | POA: Diagnosis not present

## 2016-09-19 DIAGNOSIS — I10 Essential (primary) hypertension: Secondary | ICD-10-CM | POA: Diagnosis not present

## 2016-09-19 DIAGNOSIS — I82502 Chronic embolism and thrombosis of unspecified deep veins of left lower extremity: Secondary | ICD-10-CM | POA: Diagnosis not present

## 2016-09-21 DIAGNOSIS — I69354 Hemiplegia and hemiparesis following cerebral infarction affecting left non-dominant side: Secondary | ICD-10-CM | POA: Diagnosis not present

## 2016-09-21 DIAGNOSIS — F333 Major depressive disorder, recurrent, severe with psychotic symptoms: Secondary | ICD-10-CM | POA: Diagnosis not present

## 2016-09-21 DIAGNOSIS — E1165 Type 2 diabetes mellitus with hyperglycemia: Secondary | ICD-10-CM | POA: Diagnosis not present

## 2016-09-21 DIAGNOSIS — I10 Essential (primary) hypertension: Secondary | ICD-10-CM | POA: Diagnosis not present

## 2016-09-21 DIAGNOSIS — I82502 Chronic embolism and thrombosis of unspecified deep veins of left lower extremity: Secondary | ICD-10-CM | POA: Diagnosis not present

## 2016-09-21 DIAGNOSIS — R2689 Other abnormalities of gait and mobility: Secondary | ICD-10-CM | POA: Diagnosis not present

## 2016-09-27 ENCOUNTER — Ambulatory Visit (INDEPENDENT_AMBULATORY_CARE_PROVIDER_SITE_OTHER): Payer: Medicare Other | Admitting: *Deleted

## 2016-09-27 DIAGNOSIS — Z5181 Encounter for therapeutic drug level monitoring: Secondary | ICD-10-CM | POA: Diagnosis not present

## 2016-09-27 DIAGNOSIS — I82409 Acute embolism and thrombosis of unspecified deep veins of unspecified lower extremity: Secondary | ICD-10-CM | POA: Diagnosis not present

## 2016-09-27 DIAGNOSIS — Z8679 Personal history of other diseases of the circulatory system: Secondary | ICD-10-CM | POA: Diagnosis not present

## 2016-09-27 DIAGNOSIS — Z7901 Long term (current) use of anticoagulants: Secondary | ICD-10-CM

## 2016-09-27 LAB — POCT INR: INR: 2.7

## 2016-10-03 ENCOUNTER — Other Ambulatory Visit: Payer: Self-pay | Admitting: Cardiology

## 2016-10-05 ENCOUNTER — Ambulatory Visit (HOSPITAL_COMMUNITY): Payer: Medicare Other | Admitting: Adult Health

## 2016-10-05 ENCOUNTER — Other Ambulatory Visit (HOSPITAL_COMMUNITY): Payer: Medicare Other

## 2016-10-06 ENCOUNTER — Emergency Department (HOSPITAL_COMMUNITY): Payer: Medicare Other

## 2016-10-06 ENCOUNTER — Observation Stay (HOSPITAL_COMMUNITY): Payer: Medicare Other

## 2016-10-06 ENCOUNTER — Observation Stay (HOSPITAL_COMMUNITY)
Admission: EM | Admit: 2016-10-06 | Discharge: 2016-10-07 | Disposition: A | Discharge status: Home / Self Care | Payer: Medicare Other | Attending: Internal Medicine | Admitting: Internal Medicine

## 2016-10-06 ENCOUNTER — Observation Stay (HOSPITAL_BASED_OUTPATIENT_CLINIC_OR_DEPARTMENT_OTHER): Payer: Medicare Other

## 2016-10-06 ENCOUNTER — Encounter (HOSPITAL_COMMUNITY): Payer: Self-pay

## 2016-10-06 DIAGNOSIS — R531 Weakness: Secondary | ICD-10-CM | POA: Diagnosis not present

## 2016-10-06 DIAGNOSIS — E1159 Type 2 diabetes mellitus with other circulatory complications: Secondary | ICD-10-CM | POA: Diagnosis not present

## 2016-10-06 DIAGNOSIS — I421 Obstructive hypertrophic cardiomyopathy: Secondary | ICD-10-CM

## 2016-10-06 DIAGNOSIS — I10 Essential (primary) hypertension: Secondary | ICD-10-CM | POA: Insufficient documentation

## 2016-10-06 DIAGNOSIS — M546 Pain in thoracic spine: Secondary | ICD-10-CM | POA: Diagnosis not present

## 2016-10-06 DIAGNOSIS — G8194 Hemiplegia, unspecified affecting left nondominant side: Secondary | ICD-10-CM | POA: Diagnosis present

## 2016-10-06 DIAGNOSIS — Z87891 Personal history of nicotine dependence: Secondary | ICD-10-CM | POA: Insufficient documentation

## 2016-10-06 DIAGNOSIS — Z79899 Other long term (current) drug therapy: Secondary | ICD-10-CM | POA: Diagnosis not present

## 2016-10-06 DIAGNOSIS — Z794 Long term (current) use of insulin: Secondary | ICD-10-CM | POA: Diagnosis not present

## 2016-10-06 DIAGNOSIS — I69354 Hemiplegia and hemiparesis following cerebral infarction affecting left non-dominant side: Principal | ICD-10-CM | POA: Insufficient documentation

## 2016-10-06 DIAGNOSIS — G8192 Hemiplegia, unspecified affecting left dominant side: Secondary | ICD-10-CM | POA: Diagnosis not present

## 2016-10-06 DIAGNOSIS — Z7901 Long term (current) use of anticoagulants: Secondary | ICD-10-CM | POA: Diagnosis not present

## 2016-10-06 DIAGNOSIS — S79912A Unspecified injury of left hip, initial encounter: Secondary | ICD-10-CM | POA: Diagnosis not present

## 2016-10-06 DIAGNOSIS — E119 Type 2 diabetes mellitus without complications: Secondary | ICD-10-CM | POA: Diagnosis present

## 2016-10-06 DIAGNOSIS — R404 Transient alteration of awareness: Secondary | ICD-10-CM | POA: Diagnosis not present

## 2016-10-06 DIAGNOSIS — I6523 Occlusion and stenosis of bilateral carotid arteries: Secondary | ICD-10-CM | POA: Diagnosis not present

## 2016-10-06 DIAGNOSIS — E1165 Type 2 diabetes mellitus with hyperglycemia: Secondary | ICD-10-CM | POA: Diagnosis present

## 2016-10-06 DIAGNOSIS — R4182 Altered mental status, unspecified: Secondary | ICD-10-CM | POA: Diagnosis not present

## 2016-10-06 DIAGNOSIS — M25552 Pain in left hip: Secondary | ICD-10-CM | POA: Diagnosis not present

## 2016-10-06 LAB — DIFFERENTIAL
Basophils Absolute: 0 10*3/uL (ref 0.0–0.1)
Basophils Relative: 1 %
Eosinophils Absolute: 0.2 10*3/uL (ref 0.0–0.7)
Eosinophils Relative: 4 %
Lymphocytes Relative: 29 %
Lymphs Abs: 1.8 10*3/uL (ref 0.7–4.0)
Monocytes Absolute: 0.4 10*3/uL (ref 0.1–1.0)
Monocytes Relative: 6 %
Neutro Abs: 3.9 10*3/uL (ref 1.7–7.7)
Neutrophils Relative %: 60 %

## 2016-10-06 LAB — ECHOCARDIOGRAM COMPLETE
E decel time: 320 msec
E/e' ratio: 16.14
FS: 39 % (ref 28–44)
Height: 66 in
IVS/LV PW RATIO, ED: 1.27
LA ID, A-P, ES: 38 mm
LA diam end sys: 38 mm
LA diam index: 2.08 cm/m2
LA vol A4C: 38.7 ml
LA vol index: 20 mL/m2
LA vol: 36.6 mL
LV E/e' medial: 16.14
LV E/e'average: 16.14
LV PW d: 14.2 mm — AB (ref 0.6–1.1)
LV dias vol index: 23 mL/m2
LV dias vol: 43 mL — AB (ref 46–106)
LV e' LATERAL: 4.9 cm/s
LV sys vol index: 6 mL/m2
LV sys vol: 11 mL — AB (ref 14–42)
LVOT area: 2.54 cm2
LVOT diameter: 18 mm
Lateral S' vel: 11.4 cm/s
MV Dec: 320
MV Peak grad: 3 mmHg
MV pk A vel: 111 m/s
MV pk E vel: 79.1 m/s
Simpson's disk: 74
Stroke v: 31 ml
TAPSE: 17.5 mm
TDI e' lateral: 4.9
TDI e' medial: 3.59
Weight: 2496 oz

## 2016-10-06 LAB — CBC
HCT: 37.4 % (ref 36.0–46.0)
Hemoglobin: 12.8 g/dL (ref 12.0–15.0)
MCH: 30.3 pg (ref 26.0–34.0)
MCHC: 34.2 g/dL (ref 30.0–36.0)
MCV: 88.6 fL (ref 78.0–100.0)
Platelets: 231 10*3/uL (ref 150–400)
RBC: 4.22 MIL/uL (ref 3.87–5.11)
RDW: 12.6 % (ref 11.5–15.5)
WBC: 6.4 10*3/uL (ref 4.0–10.5)

## 2016-10-06 LAB — URINALYSIS, ROUTINE W REFLEX MICROSCOPIC
Bacteria, UA: NONE SEEN
Bilirubin Urine: NEGATIVE
Glucose, UA: NEGATIVE mg/dL
Ketones, ur: NEGATIVE mg/dL
Leukocytes, UA: NEGATIVE
Nitrite: NEGATIVE
Protein, ur: NEGATIVE mg/dL
Specific Gravity, Urine: 1.004 — ABNORMAL LOW (ref 1.005–1.030)
pH: 7 (ref 5.0–8.0)

## 2016-10-06 LAB — COMPREHENSIVE METABOLIC PANEL
ALT: 13 U/L — ABNORMAL LOW (ref 14–54)
AST: 18 U/L (ref 15–41)
Albumin: 3.5 g/dL (ref 3.5–5.0)
Alkaline Phosphatase: 80 U/L (ref 38–126)
Anion gap: 9 (ref 5–15)
BUN: 11 mg/dL (ref 6–20)
CO2: 29 mmol/L (ref 22–32)
Calcium: 8.8 mg/dL — ABNORMAL LOW (ref 8.9–10.3)
Chloride: 100 mmol/L — ABNORMAL LOW (ref 101–111)
Creatinine, Ser: 0.77 mg/dL (ref 0.44–1.00)
GFR calc Af Amer: >60 mL/min (ref >60)
GFR calc non Af Amer: >60 mL/min (ref >60)
Glucose, Bld: 234 mg/dL — ABNORMAL HIGH (ref 65–99)
Potassium: 4.1 mmol/L (ref 3.5–5.1)
Sodium: 138 mmol/L (ref 135–145)
Total Bilirubin: 0.3 mg/dL (ref 0.3–1.2)
Total Protein: 6.9 g/dL (ref 6.5–8.1)

## 2016-10-06 LAB — CBG MONITORING, ED: Glucose-Capillary: 154 mg/dL — ABNORMAL HIGH (ref 65–99)

## 2016-10-06 LAB — APTT: aPTT: 41 seconds — ABNORMAL HIGH (ref 24–36)

## 2016-10-06 LAB — I-STAT TROPONIN, ED: Troponin i, poc: 0.01 ng/mL (ref 0.00–0.08)

## 2016-10-06 LAB — GLUCOSE, CAPILLARY: Glucose-Capillary: 208 mg/dL — ABNORMAL HIGH (ref 65–99)

## 2016-10-06 LAB — PROTIME-INR
INR: 2.67
Prothrombin Time: 28.2 seconds — ABNORMAL HIGH (ref 11.4–15.2)

## 2016-10-06 LAB — MRSA PCR SCREENING: MRSA by PCR: POSITIVE — AB

## 2016-10-06 MED ORDER — METOCLOPRAMIDE HCL 10 MG PO TABS: 5.0000 mg | ORAL_TABLET | Freq: Three times a day (TID) | ORAL | Status: DC | PRN

## 2016-10-06 MED ORDER — ASPIRIN 300 MG RE SUPP
300.0000 mg | Freq: Every day | RECTAL | Status: DC
  Filled 2016-10-06: qty 1

## 2016-10-06 MED ORDER — SENNOSIDES-DOCUSATE SODIUM 8.6-50 MG PO TABS
1.0000 | ORAL_TABLET | Freq: Every evening | ORAL | Status: DC | PRN
  Administered 2016-10-06: 1 via ORAL
  Filled 2016-10-06 (×2): qty 1

## 2016-10-06 MED ORDER — POLYETHYLENE GLYCOL 3350 17 G PO PACK
17.0000 g | PACK | Freq: Every day | ORAL | Status: DC | PRN
  Administered 2016-10-06: 17 g via ORAL
  Filled 2016-10-06: qty 1

## 2016-10-06 MED ORDER — HYDROCORTISONE 2.5 % RE CREA
1.0000 "application " | TOPICAL_CREAM | Freq: Two times a day (BID) | RECTAL | Status: DC
  Administered 2016-10-06 – 2016-10-07 (×2): 1 via RECTAL
  Filled 2016-10-06: qty 28.35

## 2016-10-06 MED ORDER — CALCIUM CARBONATE ANTACID 500 MG PO CHEW: 1.0000 | CHEWABLE_TABLET | Freq: Four times a day (QID) | ORAL | Status: DC | PRN

## 2016-10-06 MED ORDER — LATANOPROST 0.005 % OP SOLN
1.0000 [drp] | Freq: Every day | OPHTHALMIC | Status: DC
  Administered 2016-10-06: 1 [drp] via OPHTHALMIC
  Filled 2016-10-06: qty 2.5

## 2016-10-06 MED ORDER — ACETAMINOPHEN 650 MG RE SUPP: 650.0000 mg | RECTAL | Status: DC | PRN

## 2016-10-06 MED ORDER — LORAZEPAM 0.5 MG PO TABS
0.5000 mg | ORAL_TABLET | Freq: Three times a day (TID) | ORAL | Status: DC | PRN
  Administered 2016-10-06 – 2016-10-07 (×2): 0.5 mg via ORAL
  Filled 2016-10-06 (×2): qty 1

## 2016-10-06 MED ORDER — ASPIRIN 325 MG PO TABS
325.0000 mg | ORAL_TABLET | Freq: Every day | ORAL | Status: DC
  Administered 2016-10-06 – 2016-10-07 (×2): 325 mg via ORAL
  Filled 2016-10-06 (×2): qty 1

## 2016-10-06 MED ORDER — POLYETHYLENE GLYCOL 3350 17 GM/SCOOP PO POWD
17.0000 g | Freq: Every day | ORAL | Status: DC | PRN
  Filled 2016-10-06: qty 255

## 2016-10-06 MED ORDER — INSULIN ASPART 100 UNIT/ML ~~LOC~~ SOLN
0.0000 [IU] | Freq: Three times a day (TID) | SUBCUTANEOUS | Status: DC
  Administered 2016-10-07 (×2): 2 [IU] via SUBCUTANEOUS

## 2016-10-06 MED ORDER — INSULIN DETEMIR 100 UNIT/ML ~~LOC~~ SOLN
50.0000 [IU] | Freq: Every day | SUBCUTANEOUS | Status: DC
  Administered 2016-10-06: 50 [IU] via SUBCUTANEOUS
  Filled 2016-10-06 (×3): qty 0.5

## 2016-10-06 MED ORDER — DARIFENACIN HYDROBROMIDE ER 7.5 MG PO TB24
7.5000 mg | ORAL_TABLET | Freq: Every day | ORAL | Status: DC
  Administered 2016-10-06 – 2016-10-07 (×2): 7.5 mg via ORAL
  Filled 2016-10-06 (×2): qty 1

## 2016-10-06 MED ORDER — INFLUENZA VAC SPLIT HIGH-DOSE 0.5 ML IM SUSY
0.5000 mL | PREFILLED_SYRINGE | INTRAMUSCULAR | Status: DC
  Filled 2016-10-06: qty 0.5

## 2016-10-06 MED ORDER — METOPROLOL SUCCINATE ER 25 MG PO TB24
12.5000 mg | ORAL_TABLET | Freq: Two times a day (BID) | ORAL | Status: DC
  Administered 2016-10-06 – 2016-10-07 (×3): 12.5 mg via ORAL
  Filled 2016-10-06 (×3): qty 1

## 2016-10-06 MED ORDER — POLYVINYL ALCOHOL 1.4 % OP SOLN
1.0000 [drp] | Freq: Three times a day (TID) | OPHTHALMIC | Status: DC
  Administered 2016-10-06 – 2016-10-07 (×3): 1 [drp] via OPHTHALMIC
  Filled 2016-10-06 (×2): qty 15

## 2016-10-06 MED ORDER — LOPERAMIDE HCL 2 MG PO CAPS: 4.0000 mg | ORAL_CAPSULE | Freq: Every day | ORAL | Status: DC | PRN

## 2016-10-06 MED ORDER — RISPERIDONE 1 MG PO TABS
3.0000 mg | ORAL_TABLET | Freq: Two times a day (BID) | ORAL | Status: DC
  Administered 2016-10-06 – 2016-10-07 (×3): 3 mg via ORAL
  Filled 2016-10-06 (×3): qty 3

## 2016-10-06 MED ORDER — PROMETHAZINE HCL 12.5 MG PO TABS
12.5000 mg | ORAL_TABLET | Freq: Four times a day (QID) | ORAL | Status: DC | PRN
  Filled 2016-10-06: qty 1

## 2016-10-06 MED ORDER — ENOXAPARIN SODIUM 100 MG/ML ~~LOC~~ SOLN: 100.0000 mg | SUBCUTANEOUS | Status: DC

## 2016-10-06 MED ORDER — WARFARIN SODIUM 2.5 MG PO TABS
2.5000 mg | ORAL_TABLET | Freq: Once | ORAL | Status: AC
  Administered 2016-10-06: 2.5 mg via ORAL
  Filled 2016-10-06: qty 1

## 2016-10-06 MED ORDER — VITAMIN D (ERGOCALCIFEROL) 1.25 MG (50000 UNIT) PO CAPS
50000.0000 [IU] | ORAL_CAPSULE | ORAL | Status: DC
Start: 2016-10-06 — End: 2016-10-07
  Administered 2016-10-06: 50000 [IU] via ORAL
  Filled 2016-10-06 (×2): qty 1

## 2016-10-06 MED ORDER — MIRTAZAPINE 30 MG PO TABS
30.0000 mg | ORAL_TABLET | Freq: Every day | ORAL | Status: DC
  Administered 2016-10-06: 30 mg via ORAL
  Filled 2016-10-06: qty 1

## 2016-10-06 MED ORDER — ACETAMINOPHEN 325 MG PO TABS: 650.0000 mg | ORAL_TABLET | Freq: Four times a day (QID) | ORAL | Status: DC | PRN

## 2016-10-06 MED ORDER — SODIUM CHLORIDE 0.9 % IV SOLN
INTRAVENOUS | Status: AC
  Administered 2016-10-06 – 2016-10-07 (×2): via INTRAVENOUS

## 2016-10-06 MED ORDER — CLONAZEPAM 0.5 MG PO TABS
1.0000 mg | ORAL_TABLET | Freq: Once | ORAL | Status: AC
  Administered 2016-10-06: 1 mg via ORAL
  Filled 2016-10-06: qty 2

## 2016-10-06 MED ORDER — ARIPIPRAZOLE 2 MG PO TABS
2.0000 mg | ORAL_TABLET | Freq: Every day | ORAL | Status: DC
  Administered 2016-10-06 – 2016-10-07 (×2): 2 mg via ORAL
  Filled 2016-10-06 (×4): qty 1

## 2016-10-06 MED ORDER — DULOXETINE HCL 60 MG PO CPEP
120.0000 mg | ORAL_CAPSULE | Freq: Every day | ORAL | Status: DC
  Administered 2016-10-06 – 2016-10-07 (×2): 120 mg via ORAL
  Filled 2016-10-06 (×2): qty 2

## 2016-10-06 MED ORDER — ONDANSETRON HCL 4 MG PO TABS
4.0000 mg | ORAL_TABLET | Freq: Three times a day (TID) | ORAL | Status: DC
  Administered 2016-10-06 – 2016-10-07 (×4): 4 mg via ORAL
  Filled 2016-10-06 (×4): qty 1

## 2016-10-06 MED ORDER — ACETAMINOPHEN 325 MG PO TABS
650.0000 mg | ORAL_TABLET | ORAL | Status: DC | PRN
  Administered 2016-10-06: 650 mg via ORAL
  Filled 2016-10-06: qty 2

## 2016-10-06 MED ORDER — TIZANIDINE HCL 4 MG PO TABS
2.0000 mg | ORAL_TABLET | Freq: Every day | ORAL | Status: DC
  Administered 2016-10-06 – 2016-10-07 (×2): 2 mg via ORAL
  Filled 2016-10-06 (×2): qty 1

## 2016-10-06 MED ORDER — STROKE: EARLY STAGES OF RECOVERY BOOK
Freq: Once | Status: DC
  Filled 2016-10-06: qty 1

## 2016-10-06 MED ORDER — SIMVASTATIN 20 MG PO TABS
20.0000 mg | ORAL_TABLET | Freq: Every day | ORAL | Status: DC
  Administered 2016-10-06: 20 mg via ORAL
  Filled 2016-10-06: qty 1

## 2016-10-06 MED ORDER — PANTOPRAZOLE SODIUM 40 MG PO TBEC
40.0000 mg | DELAYED_RELEASE_TABLET | Freq: Every day | ORAL | Status: DC
  Administered 2016-10-06 – 2016-10-07 (×2): 40 mg via ORAL
  Filled 2016-10-06 (×2): qty 1

## 2016-10-06 MED ORDER — CLONAZEPAM 0.5 MG PO TABS
0.5000 mg | ORAL_TABLET | Freq: Four times a day (QID) | ORAL | Status: DC
  Administered 2016-10-06 – 2016-10-07 (×5): 0.5 mg via ORAL
  Filled 2016-10-06 (×4): qty 1

## 2016-10-06 MED ORDER — WARFARIN - PHARMACIST DOSING INPATIENT
Status: DC
Start: 2016-10-07 — End: 2016-10-07

## 2016-10-06 MED ORDER — POLYSACCHARIDE IRON COMPLEX 150 MG PO CAPS
150.0000 mg | ORAL_CAPSULE | Freq: Every day | ORAL | Status: DC
  Administered 2016-10-06 – 2016-10-07 (×2): 150 mg via ORAL
  Filled 2016-10-06 (×2): qty 1

## 2016-10-06 MED ORDER — ACETAMINOPHEN 160 MG/5ML PO SOLN: 650.0000 mg | ORAL | Status: DC | PRN

## 2016-10-06 MED ORDER — TRAMADOL HCL 50 MG PO TABS
50.0000 mg | ORAL_TABLET | Freq: Once | ORAL | Status: AC
  Administered 2016-10-06: 50 mg via ORAL
  Filled 2016-10-06: qty 1

## 2016-10-06 MED ORDER — HYDROCODONE-ACETAMINOPHEN 5-325 MG PO TABS
1.0000 | ORAL_TABLET | Freq: Three times a day (TID) | ORAL | Status: DC
  Administered 2016-10-06 – 2016-10-07 (×3): 1 via ORAL
  Filled 2016-10-06 (×3): qty 1

## 2016-10-06 NOTE — Evaluation (Signed)
Occupational Therapy Evaluation Patient Details Name: [name] MRN: [id number] DOB: 1945/10/21 Today's Date: 10/06/2016    History of Present Illness [name] is a 71 y.o. female  History significant for depression, hypertension, reflux, left-sided weakness. Schizophrenia versus emergency room with complaint of fall with increased weakness. Patient had ambulatory with her walker and not slid down to the floor her knees. Delorse Lek this. Patient complains of left orbit extremity.functioning now. He slurred speech. EMS activated patient brought to emergency room for evaluation.   Clinical Impression   Pt received supine in bed, agreeable to OT evaluation. Pt reports she has been receiving assistance from staff for ADL completion, unclear level of assistance required. During evaluation pt limited in LB dressing due to pain and weakness in LLE. Pt unable to complete standing tasks or complete functional mobility to restroom due to pain and weakness in LLE. According to notes from 01/2016 pt was able to perform mobility tasks with RW and min A, and pt reports she has been ambulation to bathroom independently PTA. LUE strength is weak but functional, coordination and sensation appear intact. Recommend SNF on discharge to improve independence and safety during ADL and functional mobility task completion.     Follow Up Recommendations  SNF    Equipment Recommendations  None recommended by OT       Precautions / Restrictions Precautions Precautions: Fall Precaution Comments: limited mobility at baseline, reports a fall this week Restrictions Weight Bearing Restrictions: No      Mobility Bed Mobility Overal bed mobility: Needs Assistance Bed Mobility: Rolling;Supine to Sit;Sit to Supine Rolling: Min assist   Supine to sit: Min assist Sit to supine: Min assist      Transfers Overall transfer level: Needs assistance Equipment used: Rolling walker (2 wheeled) Transfers: Sit  to/from Stand Sit to Stand: Mod assist                  ADL either performed or assessed with clinical judgement   ADL Overall ADL's : Needs assistance/impaired;At baseline Eating/Feeding: Set up;Sitting   Grooming: Supervision/safety;Sitting               Lower Body Dressing: Moderate assistance;Sitting/lateral leans Lower Body Dressing Details (indicate cue type and reason): Pt requiring assistance to donn left sock. Max assist to doff socks               General ADL Comments: Pt requiring assistance for ADL completion at baseline, currently unable to complete standing tasks due to weakness and LLE pain. Staff at [facility] assist with completion     Vision Baseline Vision/History: No visual deficits Patient Visual Report: No change from baseline Vision Assessment?: No apparent visual deficits            Pertinent Vitals/Pain Pain Assessment: 0-10 Pain Score: 8  Pain Location: left back and hip, right knee Pain Descriptors / Indicators: Aching;Sore Pain Intervention(s): Limited activity within patient's tolerance;Monitored during session     Hand Dominance Right   Extremity/Trunk Assessment Upper Extremity Assessment Upper Extremity Assessment: Overall WFL for tasks assessed;LUE deficits/detail LUE Deficits / Details: strength grossly 4-/5 throughout   Lower Extremity Assessment Lower Extremity Assessment: RLE deficits/detail;LLE deficits/detail RLE Deficits / Details: grossly -4/5 RLE Sensation: decreased light touch LLE Deficits / Details: grossly -3/5 except left dorsiflexion 0/5 LLE Sensation: decreased light touch   Cervical / Trunk Assessment Cervical / Trunk Assessment: Kyphotic   Communication Communication Communication: No difficulties   Cognition Arousal/Alertness: Awake/alert Behavior During Therapy:  WFL for tasks assessed/performed Overall Cognitive Status: Within Functional Limits for tasks assessed                                                 Home Living Family/patient expects to be discharged to:: Skilled nursing facility                                        Prior Functioning/Environment Level of Independence: Needs assistance  Gait / Transfers Assistance Needed: Pt uses RW and wheelchair at [facility], states she mostly uses wheelchair ADL's / [place] Needed: Staff assist with all ADLs-dressing, bathing, grooming, meal prep, medications            OT Problem List: Decreased strength;Decreased activity tolerance;Impaired balance (sitting and/or standing);Decreased safety awareness;Decreased knowledge of use of DME or AE;Pain       AM-PAC PT "6 Clicks" Daily Activity     Outcome Measure Help from another person eating meals?: None Help from another person taking care of personal grooming?: A Little Help from another person toileting, which includes using toliet, bedpan, or urinal?: Total Help from another person bathing (including washing, rinsing, drying)?: A Lot Help from another person to put on and taking off regular upper body clothing?: A Little Help from another person to put on and taking off regular lower body clothing?: A Lot 6 Click Score: 15   End of Session Equipment Utilized During Treatment: Gait belt;Rolling walker  Activity Tolerance: Patient limited by pain Patient left: in bed;with call bell/phone within reach;with bed alarm set  OT Visit Diagnosis: Muscle weakness (generalized) (M62.81);Pain Pain - Right/Left: Left Pain - part of body: Hip                Time: 1445-1516 OT Time Calculation (min): 31 min Charges:  OT General Charges $OT Visit: 1 Visit OT Evaluation $OT Eval Moderate Complexity: 1 Mod G-Codes: OT G-codes **NOT FOR INPATIENT CLASS** Functional Assessment Tool Used: AM-PAC 6 Clicks Daily Activity Functional Limitation: Self care Self Care Current Status (K5625): At least 40 percent but less than 60 percent  impaired, limited or restricted Self Care Goal Status (W3893): At least 40 percent but less than 60 percent impaired, limited or restricted Self Care Discharge Status [phone]): At least 40 percent but less than 60 percent impaired, limited or restricted   Guadelupe Sabin, OTR/L  [phone] 10/06/2016, 3:32 PM

## 2016-10-06 NOTE — ED Triage Notes (Signed)
Pt is resident of [facility] where ems picked up for possible stroke.  Pt c/o pain to left hip and down left leg and increased weakness to same.

## 2016-10-06 NOTE — Progress Notes (Signed)
ANTICOAGULATION CONSULT NOTE - Initial Consult  Pharmacy Consult for Warfarin Indication: DVT (long term therapy)  Allergies  Allergen Reactions  . Sulfa Antibiotics Rash  . Sulfonamide Derivatives Rash    REACTION: rash    Patient Measurements: Height: 5\' 6"  (167.6 cm) Weight: 164 lb 8 oz (74.6 kg) IBW/kg (Calculated) : 59.3 Heparin Dosing Weight:   Vital Signs: Temp: 97.8 F (36.6 C) (09/14 1359) Temp Source: Oral (09/14 1359) BP: 143/67 (09/14 1359) Pulse Rate: 67 (09/14 1359)  Labs:  Recent Labs  10/06/16 0425  HGB 12.8  HCT 37.4  PLT 231  APTT 41*  LABPROT 28.2*  INR 2.67  CREATININE 0.77    Estimated Creatinine Clearance: 66.6 mL/min (by C-G formula based on SCr of 0.77 mg/dL).   Medical History: Past Medical History:  Diagnosis Date  . Anxiety   . Arthritis   . Depression    History of psychosis and previous suicide attempt  . DVT, lower extremity, recurrent (HCC)    Long-term Coumadin per Dr. Legrand Rams  . Essential hypertension   . GERD (gastroesophageal reflux disease)   . Hemiplegia ([place]) 2010   Left side  . History of stroke    Acute infarct and right cerebral white matter small vessel disease 12/10  . Leg DVT (deep venous thromboembolism), acute ([place]) 2006  . Schizophrenia ([place])   . Stroke [facility])    left sided weakness  . Type 2 diabetes mellitus (HCC)     Medications:  Prescriptions Prior to Admission  Medication Sig Dispense Refill Last Dose  . acetaminophen (TYLENOL) 325 MG tablet Take 650 mg by mouth every 6 (six) hours as needed. For fever > 101    unknown  . ARIPiprazole (ABILIFY) 2 MG tablet Take 2 mg by mouth daily.    10/05/2016 at Unknown time  . calcium carbonate (TUMS - DOSED IN MG ELEMENTAL CALCIUM) 500 MG chewable tablet Chew 1 tablet by mouth 4 (four) times daily as needed for indigestion or heartburn.    unknown  . clonazePAM (KLONOPIN) 0.5 MG tablet Take 0.5 mg by mouth 4 (four) times daily.   10/05/2016 at Unknown time  .  DULoxetine (CYMBALTA) 60 MG capsule Take 120 mg by mouth daily.    10/05/2016 at Unknown time  . HYDROcodone-acetaminophen (NORCO) 5-325 MG per tablet Take 1 tablet by mouth 3 (three) times daily.    10/05/2016 at Unknown time  . hydrocortisone (ANUSOL-HC) 2.5 % rectal cream Place 1 application rectally 2 (two) times daily. 30 g 1 unknown  . Insulin Aspart (NOVOLOG FLEXPEN [place]) Inject 15-21 Units into the skin 3 (three) times daily before meals.   10/05/2016 at Unknown time  . Insulin Detemir (LEVEMIR FLEXTOUCH) 100 UNIT/ML Pen INJECT 50 UNITS SUBCUTANEOUSLY AT BEDTIME. 15 mL 2 10/05/2016 at Unknown time  . iron polysaccharides (NIFEREX) 150 MG capsule Take 1 capsule (150 mg total) by mouth daily. 30 capsule 11 10/05/2016 at Unknown time  . latanoprost (XALATAN) 0.005 % ophthalmic solution Place 1 drop into both eyes at bedtime.   10/05/2016 at Unknown time  . loperamide (IMODIUM) 2 MG capsule Take 4 mg by mouth daily as needed for diarrhea or loose stools. Take 2 capsules initially then take 2 after each loose stool per MAR   unknown  . metoCLOPramide (REGLAN) 5 MG tablet Take 1 tablet (5 mg total) by mouth every 8 (eight) hours as needed for nausea or vomiting (or bloating). 5 tablet 0 unknown  . metoprolol succinate (TOPROL-XL) 25 MG  24 hr tablet Take 12.5 mg by mouth 2 (two) times daily.    10/05/2016 at 2000  . mirtazapine (REMERON) 30 MG tablet Take 30 mg by mouth at bedtime.     10/05/2016 at Unknown time  . omeprazole (PRILOSEC) 40 MG capsule Take 40 mg by mouth daily.   10/05/2016 at Unknown time  . ondansetron (ZOFRAN) 4 MG tablet Take 1 tablet (4 mg total) by mouth 4 (four) times daily -  before meals and at bedtime. 120 tablet 3 unknown  . polyethylene glycol powder (GLYCOLAX/MIRALAX) powder Take 17 g by mouth daily as needed.   unknown  . polyvinyl alcohol (ARTIFICIAL TEARS) 1.4 % ophthalmic solution Place 1 drop into both eyes 3 (three) times daily.   10/05/2016 at Unknown time  . promethazine  (PHENERGAN) 12.5 MG tablet TAKE 1 TABLET BY MOUTH EVERY 6 HOURS AS NEEDED FOR NAUSEA OR VOMITING. 30 tablet 0 unknown  . risperiDONE (RISPERDAL) 3 MG tablet Take 3 mg by mouth 2 (two) times daily.    10/05/2016 at Unknown time  . simvastatin (ZOCOR) 20 MG tablet Take 20 mg by mouth at bedtime.    10/05/2016 at Unknown time  . solifenacin (VESICARE) 5 MG tablet Take 5 mg by mouth daily.   10/05/2016 at Unknown time  . tiZANidine (ZANAFLEX) 2 MG tablet Take 2 mg by mouth daily.   10/05/2016 at Unknown time  . Vitamin D, Ergocalciferol, (DRISDOL) 50000 UNITS CAPS capsule Take 50,000 Units by mouth every Friday.    Past Week at Unknown time  . warfarin (COUMADIN) 5 MG tablet Take 1 tablet daily except 1/2 tablet on Mondays, Wednesdays and Fridays 28 tablet 4 10/05/2016 at Unknown time    Assessment: Okay for Protocol, INR at goal.  Last dose on 9/13.  Goal of Therapy:  INR 2-3   Plan:  Warfarin 2.5mg  PO x1 Daily PT/INR Monitor for signs and symptoms of bleeding.   Pricilla Larsson 10/06/2016,2:44 PM

## 2016-10-06 NOTE — Care Management (Signed)
Pt seen to administer MOON form. Pt from [facility]. Recently DC'd from PT services through unknown provider.

## 2016-10-06 NOTE — Evaluation (Signed)
Physical Therapy Evaluation Patient Details Name: [name] MRN: [id number] DOB: April 09, 1945 Today's Date: 10/06/2016   History of Present Illness  [name] is a 71 y.o. female  History significant for depression, hypertension, reflux, left-sided weakness. Schizophrenia versus emergency room with complaint of fall with increased weakness. Patient had ambulatory with her walker and not slid down to the floor her knees. Delorse Lek this. Patient complains of left orbit extremity.functioning now. He slurred speech. EMS activated patient brought to emergency room for evaluation.    Clinical Impression  Patient demonstrates slow labored movement for sitting up at bedside and limited to a few side steps due to BLE weakness and c/o fatigue (see below).  Patient will benefit from continued physical therapy in hospital and recommended venue below to increase strength, balance, endurance for safe ADLs and gait.    Follow Up Recommendations SNF;Supervision/Assistance - 24 hour    Equipment Recommendations  None recommended by PT    Recommendations for Other Services       Precautions / Restrictions Precautions Precautions: Fall Precaution Comments: limited mobility at baseline, reports a fall this week Restrictions Weight Bearing Restrictions: No      Mobility  Bed Mobility Overal bed mobility: Needs Assistance Bed Mobility: Rolling;Supine to Sit;Sit to Supine Rolling: Min assist   Supine to sit: Min assist Sit to supine: Min assist      Transfers Overall transfer level: Needs assistance Equipment used: Rolling walker (2 wheeled) Transfers: Sit to/from Stand Sit to Stand: Mod assist            Ambulation/Gait Ambulation/Gait assistance: Mod assist;Max assist Ambulation Distance (Feet): 4 Feet Assistive device: Rolling walker (2 wheeled)     Gait velocity interpretation: Below normal speed for age/gender General Gait Details: Patient limited to a few side steps  secondary to BLE weakness, (left weaker than right), left foot drop noted when taking steps  Stairs            Wheelchair Mobility    Modified Rankin (Stroke Patients Only)       Balance Overall balance assessment: Needs assistance Sitting-balance support: No upper extremity supported;Feet supported Sitting balance-Leahy Scale: Fair     Standing balance support: Bilateral upper extremity supported;During functional activity Standing balance-Leahy Scale: Poor                               Pertinent Vitals/Pain Pain Assessment: 0-10 Pain Score: 8  Pain Location: left back and hip, right knee Pain Descriptors / Indicators: Aching;Sore Pain Intervention(s): Limited activity within patient's tolerance;Monitored during session    Home Living Family/patient expects to be discharged to:: Skilled nursing facility                      Prior Function Level of Independence: Needs assistance   Gait / Transfers Assistance Needed: Pt uses RW and wheelchair at [employer], states she mostly uses wheelchair  ADL's / [place] Needed: Staff assist with all ADLs-dressing, bathing, grooming, meal prep, medications        Hand Dominance   Dominant Hand: Right    Extremity/Trunk Assessment   Upper Extremity Assessment Upper Extremity Assessment: Overall WFL for tasks assessed;LUE deficits/detail LUE Deficits / Details: strength grossly 4-/5 throughout    Lower Extremity Assessment Lower Extremity Assessment: RLE deficits/detail;LLE deficits/detail RLE Deficits / Details: grossly -4/5 RLE Sensation: decreased light touch LLE Deficits / Details: grossly -3/5 except left dorsiflexion  0/5 LLE Sensation: decreased light touch    Cervical / Trunk Assessment Cervical / Trunk Assessment: Kyphotic  Communication   Communication: No difficulties  Cognition Arousal/Alertness: Awake/alert Behavior During Therapy: WFL for tasks  assessed/performed Overall Cognitive Status: Within Functional Limits for tasks assessed                                        General Comments      Exercises     Assessment/Plan    PT Assessment Patient needs continued PT services  PT Problem List Decreased strength;Decreased activity tolerance;Decreased balance;Decreased mobility       PT Treatment Interventions Gait training;Functional mobility training;Therapeutic activities;Therapeutic exercise;Patient/family education    PT Goals (Current goals can be found in the Care Plan section)  Acute Rehab PT Goals PT Goal Formulation: With patient Time For Goal Achievement: 10/13/16 Potential to Achieve Goals: Good    Frequency Min 5X/week   Barriers to discharge        Co-evaluation               AM-PAC PT "6 Clicks" Daily Activity  Outcome Measure Difficulty turning over in bed (including adjusting bedclothes, sheets and blankets)?: Unable Difficulty moving from lying on back to sitting on the side of the bed? : Unable Difficulty sitting down on and standing up from a chair with arms (e.g., wheelchair, bedside commode, etc,.)?: Unable Help needed moving to and from a bed to chair (including a wheelchair)?: A Lot Help needed walking in hospital room?: A Lot Help needed climbing 3-5 steps with a railing? : Total 6 Click Score: 8    End of Session Equipment Utilized During Treatment: Gait belt Activity Tolerance: Patient limited by fatigue Patient left: in bed;with call bell/phone within reach   PT Visit Diagnosis: Unsteadiness on feet (R26.81);Other abnormalities of gait and mobility (R26.89);Muscle weakness (generalized) (M62.81)    Time: 3291-9166 PT Time Calculation (min) (ACUTE ONLY): 31 min   Charges:   PT Evaluation $PT Eval Low Complexity: 1 Low PT Treatments $Therapeutic Activity: 23-37 mins   PT G Codes:   PT G-Codes **NOT FOR INPATIENT CLASS** Functional Limitation: Mobility:  Walking and moving around Mobility: Walking and Moving Around Current Status (M6004): At least 80 percent but less than 100 percent impaired, limited or restricted Mobility: Walking and Moving Around Goal Status [phone]): At least 80 percent but less than 100 percent impaired, limited or restricted Mobility: Walking and Moving Around Discharge Status [phone]): At least 80 percent but less than 100 percent impaired, limited or restricted    3:32 PM, 10/06/16 Lonell Grandchild, MPT Physical Therapist with [facility] [phone] office [phone] mobile phone

## 2016-10-06 NOTE — ED Notes (Addendum)
Patient complaining of anxiety. Dr. Olen Pel informed.

## 2016-10-06 NOTE — Progress Notes (Signed)
*  PRELIMINARY RESULTS* Echocardiogram 2D Echocardiogram has been performed.  [name] 10/06/2016, 9:35 AM

## 2016-10-06 NOTE — Care Management Obs Status (Signed)
[place] NOTIFICATION   Patient Details  Name: [name] MRN: [id number] Date of Birth: 04/26/1945   Medicare Observation Status Notification Given:  Yes    Sherald Barge, RN 10/06/2016, 2:49 PM

## 2016-10-06 NOTE — ED Notes (Signed)
Dr. Hobbs at bedside  

## 2016-10-06 NOTE — ED Provider Notes (Signed)
[facility] DEPT Provider Note   CSN: [id number] Arrival date & time: 10/06/16  0406     History   Chief Complaint Chief Complaint  Patient presents with  . Weakness    HPI [name] is a 71 y.o. female.  The history is provided by the patient.  She has chronic left sided weakness from prior stroke was. She states that her left leg was weaker than it normally is throughout the day yesterday. The last time was known to be at baseline was when she went to bed night before last. She is also complaining of pain in her left hip. She denies any trauma. Denies any headache. Of note, she is anticoagulated on warfarin for history of atrial fibrillation.  Past Medical History:  Diagnosis Date  . Anxiety   . Arthritis   . Depression    History of psychosis and previous suicide attempt  . DVT, lower extremity, recurrent (HCC)    Long-term Coumadin per Dr. Legrand Rams  . Essential hypertension   . GERD (gastroesophageal reflux disease)   . Hemiplegia ([place]) 2010   Left side  . History of stroke    Acute infarct and right cerebral white matter small vessel disease 12/10  . Leg DVT (deep venous thromboembolism), acute ([place]) 2006  . Schizophrenia ([place])   . Stroke [facility])    left sided weakness  . Type 2 diabetes mellitus [facility])     Patient Active Problem List   Diagnosis Date Noted  . Chronic superficial gastritis without bleeding   . Stricture and stenosis of esophagus   . Nausea with vomiting 04/20/2016  . Abdominal pain, acute, bilateral lower quadrant 03/22/2016  . Lesion of right lobe of liver 03/22/2016  . Elevated troponin 02/13/2016  . IDA (iron deficiency anemia) 04/06/2015  . Mixed hyperlipidemia 11/06/2014  . Obesity due to excess calories 11/06/2014  . Sedentary lifestyle 11/06/2014  . Rectal bleeding 10/21/2014  . Encounter for therapeutic drug monitoring 03/24/2013  . Chronic anticoagulation 04/09/2010  . Type 2 diabetes mellitus with vascular disease ([place])  03/24/2010  . Essential hypertension, benign 03/24/2010  . DVT, lower extremity, recurrent ([place]) 03/24/2010  . History of cardiovascular disorder 03/24/2010    Past Surgical History:  Procedure Laterality Date  . BACK SURGERY    . BIOPSY N/A 11/24/2014   Procedure: BIOPSY;  Surgeon: Danie Binder, MD;  Location: [facility] ORS;  Service: Endoscopy;  Laterality: N/A;  . BIOPSY  05/17/2016   Procedure: BIOPSY;  Surgeon: Danie Binder, MD;  Location: [facility] ENDO SUITE;  Service: Endoscopy;;  gastric biopsy  . COLONOSCOPY WITH PROPOFOL N/A 11/24/2014   Dr. Rudie Meyer polyps removed/moderate sized internal hemorrhoids, tubular adenomas. Next surveillance in 3 years  . ESOPHAGOGASTRODUODENOSCOPY (EGD) WITH PROPOFOL N/A 11/24/2014   Dr. Clayburn Pert HH/patent stricture at the gastroesophageal junction, mild non-erosive gastritis, path negative for H.pylori or celiac sprue  . ESOPHAGOGASTRODUODENOSCOPY (EGD) WITH PROPOFOL N/A 05/17/2016   Procedure: ESOPHAGOGASTRODUODENOSCOPY (EGD) WITH PROPOFOL;  Surgeon: Danie Binder, MD;  Location: [facility] ENDO SUITE;  Service: Endoscopy;  Laterality: N/A;  12:45pm  . GIVENS CAPSULE STUDY N/A 12/11/2014   MULTILPLE EROSION IN the stomach WITH ACTIVE OOZING. OCCASIONAL EROSIONS AND RARE ULCER SEEN IN PROXIMAL SMALL BOWEL . No masses or AVMs SEEN. NO OLD BLOOD OR FRESH BLOOD SEEN.   . POLYPECTOMY N/A 11/24/2014   Procedure: POLYPECTOMY;  Surgeon: Danie Binder, MD;  Location: [facility] ORS;  Service: Endoscopy;  Laterality: N/A;  . SAVORY DILATION N/A 05/17/2016  Procedure: SAVORY DILATION;  Surgeon: Danie Binder, MD;  Location: [facility] ENDO SUITE;  Service: Endoscopy;  Laterality: N/A;    OB History    No data available       Home Medications    Prior to Admission medications   Medication Sig Start Date End Date Taking? Authorizing Provider  acetaminophen (TYLENOL) 325 MG tablet Take 650 mg by mouth every 6 (six) hours as needed. For fever > 101     [provider]    ARIPiprazole (ABILIFY) 2 MG tablet Take 2 mg by mouth daily.  08/31/15   [provider]  calcium carbonate (TUMS - DOSED IN MG ELEMENTAL CALCIUM) 500 MG chewable tablet Chew 1 tablet by mouth 4 (four) times daily as needed for indigestion or heartburn.     [provider]  clonazePAM (KLONOPIN) 1 MG tablet Take 1 mg by mouth 3 (three) times daily.  10/08/14   [provider]  DULoxetine (CYMBALTA) 60 MG capsule Take 120 mg by mouth daily.     [provider]  enoxaparin (LOVENOX) 100 MG/ML injection Inject 1 mL (100 mg total) into the skin daily. Patient taking differently: Inject 100 mg into the skin daily. Starting on 05/04/2016-stopping on 06/07/2016 05/17/16 05/25/16  Danie Binder, MD  HYDROcodone-acetaminophen (NORCO) 5-325 MG per tablet Take 1 tablet by mouth 3 (three) times daily.     [provider]  hydrocortisone (ANUSOL-HC) 2.5 % rectal cream Place 1 application rectally 2 (two) times daily. 05/22/16   Annitta Needs, NP  Insulin Aspart (NOVOLOG FLEXPEN [place]) Inject 15-21 Units into the skin 3 (three) times daily before meals.    [provider]  Insulin Detemir (LEVEMIR FLEXTOUCH) 100 UNIT/ML Pen INJECT 50 UNITS SUBCUTANEOUSLY AT BEDTIME. 09/13/16   Nida, Marella Chimes, MD  iron polysaccharides (NIFEREX) 150 MG capsule Take 1 capsule (150 mg total) by mouth daily. 04/10/16   Baird Cancer, PA-C  latanoprost (XALATAN) 0.005 % ophthalmic solution Place 1 drop into both eyes at bedtime.    [provider]  loperamide (IMODIUM) 2 MG capsule Take 4 mg by mouth daily as needed for diarrhea or loose stools. Take 2 capsules initially then take 2 after each loose stool per [facility]    [provider]  metoCLOPramide (REGLAN) 5 MG tablet Take 1 tablet (5 mg total) by mouth every 8 (eight) hours as needed for nausea or vomiting (or bloating). 03/11/16   Francine Graven, DO  metoprolol succinate (TOPROL-XL) 25 MG 24 hr tablet Take 12.5  mg by mouth 2 (two) times daily.     [provider]  mirtazapine (REMERON) 30 MG tablet Take 30 mg by mouth at bedtime.      [provider]  naloxegol oxalate (MOVANTIK) 12.5 MG TABS tablet Take 1 tablet (12.5 mg total) by mouth daily. 03/22/16   Fields, Marga Melnick, MD  omeprazole (PRILOSEC) 40 MG capsule Take 40 mg by mouth daily.    [provider]  ondansetron (ZOFRAN) 4 MG tablet Take 1 tablet (4 mg total) by mouth 4 (four) times daily -  before meals and at bedtime. 04/20/16   Annitta Needs, NP  polyethylene glycol powder (GLYCOLAX/MIRALAX) powder Take 17 g by mouth daily as needed.    [provider]  polyvinyl alcohol (ARTIFICIAL TEARS) 1.4 % ophthalmic solution Place 1 drop into both eyes 3 (three) times daily.    [provider]  promethazine (PHENERGAN) 12.5 MG tablet TAKE 1 TABLET  BY MOUTH EVERY 6 HOURS AS NEEDED FOR NAUSEA OR VOMITING. 04/12/16   Mahala Menghini, PA-C  risperiDONE (RISPERDAL) 3 MG tablet Take 3 mg by mouth 2 (two) times daily.     [provider]  simvastatin (ZOCOR) 20 MG tablet Take 20 mg by mouth at bedtime.  03/31/16   [provider]  solifenacin (VESICARE) 5 MG tablet Take 5 mg by mouth daily.    [provider]  solifenacin (VESICARE) 5 MG tablet Take 5 mg by mouth daily.    [provider]  tiZANidine (ZANAFLEX) 2 MG tablet Take 2 mg by mouth daily.    [provider]  Vitamin D, Ergocalciferol, (DRISDOL) 50000 UNITS CAPS capsule Take 50,000 Units by mouth every Friday.     [provider]  warfarin (COUMADIN) 5 MG tablet Take 1 tablet daily except 1/2 tablet on Mondays, Wednesdays and Fridays 10/03/16   Satira Sark, MD  zolpidem (AMBIEN) 10 MG tablet Take 10 mg by mouth at bedtime.    [provider]    Family History Family History  Problem Relation Age of Onset  . Hypertension Mother   . Colon cancer Neg Hx     Social History Social History   Substance Use Topics  . Smoking status: Former Smoker    Packs/day: 0.25    Years: 20.00    Types: Cigarettes    Quit date: 01/24/1995  . Smokeless tobacco: Never Used  . Alcohol use No     Allergies   Sulfa antibiotics and Sulfonamide derivatives   Review of Systems Review of Systems  All other systems reviewed and are negative.    Physical Exam Updated Vital Signs BP (!) 145/70 (BP Location: Left Arm)   Pulse 81   Temp 98 F (36.7 C) (Oral)   Resp 18   Ht 5\' 6"  (1.676 m)   Wt 70.8 kg (156 lb)   SpO2 91%   BMI 25.18 kg/m   Physical Exam  Nursing note and vitals reviewed.  71 year old female, resting comfortably and in no acute distress. Vital signs are significant for mild hypertension. Oxygen saturation is 91%, which is borderline hypoxic. Head is normocephalic and atraumatic. PERRLA, EOMI. Oropharynx is clear. Neck is nontender and supple without adenopathy or JVD. Back is nontender and there is no CVA tenderness. Lungs are clear without rales, wheezes, or rhonchi. Chest is nontender. Heart has regular rate and rhythm without murmur. Abdomen is soft, flat, nontender without masses or hepatosplenomegaly and peristalsis is normoactive. Extremities have no cyanosis or edema, full range of motion is present. There is mild tenderness to palpation over the left hip. Skin is warm and dry without rash. Neurologic: Mental status is normal. There is a mild left central facial droop. Tongue protrudes to the left of midline. There is mild weakness of left arm. She is unable to raise her legs, so unable to assess leg strength. No Babinski reflex seen..  ED Treatments / Results  Labs (all labs ordered are listed, but only abnormal results are displayed) Labs Reviewed  PROTIME-INR - Abnormal; Notable for the following:       Result Value   Prothrombin Time 28.2 (*)    All other components within normal limits  APTT - Abnormal; Notable for the following:    aPTT 41 (*)     All other components within normal limits  COMPREHENSIVE METABOLIC PANEL - Abnormal; Notable for the following:    Chloride 100 (*)  Glucose, Bld 234 (*)    Calcium 8.8 (*)    ALT 13 (*)    All other components within normal limits  CBC  DIFFERENTIAL  URINALYSIS, ROUTINE W REFLEX MICROSCOPIC  I-STAT TROPONIN, ED    EKG  EKG Interpretation  Date/Time:  Friday October 06 2016 04:12:30 EDT Ventricular Rate:  82 PR Interval:    QRS Duration: 87 QT Interval:  395 QTC Calculation: 462 R Axis:   53 Text Interpretation:  Sinus rhythm ST elevation, consider inferior injury When compared with ECG of 05/23/2016, No significant change was found Confirmed by Delora Fuel ([id number]) on 10/06/2016 4:16:57 AM       Radiology Dg Chest 1 View  Result Date: 10/06/2016 CLINICAL DATA:  Fall this morning with left hip pain.  Weakness. EXAM: CHEST 1 VIEW COMPARISON:  Radiograph 02/13/2016 FINDINGS: Stable cardiomegaly. Unchanged mediastinal contours. No pulmonary edema. No consolidation, pleural effusion, or pneumothorax. No acute osseous abnormalities are seen. IMPRESSION: No acute abnormality. Electronically Signed   By: Jeb Levering M.D.   On: 10/06/2016 05:23   Ct Head Wo Contrast  Result Date: 10/06/2016 CLINICAL DATA:  Altered mental status after fall this morning. EXAM: CT HEAD WITHOUT CONTRAST TECHNIQUE: Contiguous axial images were obtained from the base of the skull through the vertex without intravenous contrast. COMPARISON:  Head CT 02/13/2016, brain MRI 05/23/2016 FINDINGS: Brain: Generalized atrophy. Stable degree of chronic small vessel ischemia from prior exam. Remote lacunar infarct in the left thalamus again seen. No intracranial hemorrhage, mass effect, or midline shift. No hydrocephalus. The basilar cisterns are patent. No evidence of territorial infarct or acute ischemia. No extra-axial or intracranial fluid collection. Vascular: Atherosclerosis of skullbase vasculature without  hyperdense vessel or abnormal calcification. Skull: No skull fracture.  No focal lesion. Sinuses/Orbits: Chronic opacification of right mastoid air cells. Postsurgical change in the paranasal sinuses. No acute finding. Other: None. IMPRESSION: 1. No acute intracranial abnormality.  No skull fracture. 2. Stable atrophy and chronic small vessel ischemia. Electronically Signed   By: Jeb Levering M.D.   On: 10/06/2016 05:20   Dg Hip Unilat W Or Wo Pelvis 2-3 Views Left  Result Date: 10/06/2016 CLINICAL DATA:  Fall with morning with left hip pain. EXAM: DG HIP (WITH OR WITHOUT PELVIS) 2-3V LEFT COMPARISON:  None. FINDINGS: The cortical margins of the bony pelvis and left hip are intact. No fracture. Pubic symphysis and sacroiliac joints are congruent. Both femoral heads are well-seated in the respective acetabula. Age related degenerative change of both hips. IMPRESSION: No fracture of the pelvis or left hip. Electronically Signed   By: Jeb Levering M.D.   On: 10/06/2016 05:23    Procedures Procedures (including critical care time)  Medications Ordered in ED Medications  clonazePAM (KLONOPIN) tablet 1 mg (not administered)  traMADol (ULTRAM) tablet 50 mg (not administered)     Initial Impression / Assessment and Plan / ED Course  I have reviewed the triage vital signs and the nursing notes.  Pertinent labs & imaging results that were available during my care of the patient were reviewed by me and considered in my medical decision making (see chart for details).  Probable extension of prior stroke. It has been a proximally 30 hours since she was last seen at her baseline, so she is outside the window for code stroke activation. However, routine stroke evaluation protocol is being initiated, including CT of head. We'll also check plain x-rays of her left hip.  CT shows no acute process,  hip x-rays also unremarkable. Laboratory evaluation shows therapeutic INR. Urinalysis is pending. Old  records are reviewed, and MRI showing acute stroke was in 2010, subsequent MRIs and CTs have shown white matter disease and small, lacunar infarcts. Clinical picture is still that of extension of prior stroke. She will be admitted for MRI scanning and additional workup. Case is discussed with Dr. Aggie Moats of [facility] who agrees to admit the patient.  Final Clinical Impressions(s) / ED Diagnoses   Final diagnoses:  Left hemiparesis ([place])  Anticoagulated on warfarin    New Prescriptions New Prescriptions   No medications on file     Delora Fuel, MD 77/37/36 [phone]

## 2016-10-06 NOTE — H&P (Addendum)
[facility] History and Physical  CHANLEY MCENERY VOJ:[id number] DOB: 04-22-1945 DOA: 10/06/2016  Referring physician:  PCP: [name], MD   Chief Complaint: "I fell to my knees."  HPI: KARSYNN DEWEESE is a 71 y.o. female    History significant for depression, hypertension, reflux, left-sided weakness. Schizophrenia versus emergency room with complaint of fall with increased weakness. Patient had ambulatory with her walker and not slid down to the floor her knees. Delorse Lek this. Patient complains of left orbit extremity.functioning now. He slurred speech. EMS activated patient brought to emergency room for evaluation.  ED course: EDP  confirmed left upper extremity weakness & tongue deviation; patient out of window for TPA. Decision made to admit patient to get MRI. Hospitalist consulted for admission.   Review of Systems:  As per HPI otherwise 10 point review of systems negative.    Past Medical History:  Diagnosis Date  . Anxiety   . Arthritis   . Depression    History of psychosis and previous suicide attempt  . DVT, lower extremity, recurrent (HCC)    Long-term Coumadin per Dr. Legrand Rams  . Essential hypertension   . GERD (gastroesophageal reflux disease)   . Hemiplegia ([place]) 2010   Left side  . History of stroke    Acute infarct and right cerebral white matter small vessel disease 12/10  . Leg DVT (deep venous thromboembolism), acute ([place]) 2006  . Schizophrenia ([place])   . Stroke [facility])    left sided weakness  . Type 2 diabetes mellitus ([place])    Past Surgical History:  Procedure Laterality Date  . BACK SURGERY    . BIOPSY N/A 11/24/2014   Procedure: BIOPSY;  Surgeon: Danie Binder, MD;  Location: [facility];  Service: Endoscopy;  Laterality: N/A;  . BIOPSY  05/17/2016   Procedure: BIOPSY;  Surgeon: Danie Binder, MD;  Location: [facility] END[facility] SU[facility];  Service: Endoscopy;;  gastric biopsy  . COLONOSCOPY WITH PROPOFOL N/A 11/24/2014   Dr. Rudie Meyer polyps removed/moderate  sized internal hemorrhoids, tubular adenomas. Next surveillance in 3 years  . ESOPHAGOGASTRODUODENOSCOPY (EGD) WITH PROPOFOL N/A 11/24/2014   Dr. Clayburn Pert HH/patent stricture at the gastroesophageal junction, mild non-erosive gastritis, path negative for H.pylori or celiac sprue  . ESOPHAGOGASTRODUODENOSCOPY (EGD) WITH PROPOFOL N/A 05/17/2016   Procedure: ESOPHAGOGASTRODUODENOSCOPY (EGD) WITH PROPOFOL;  Surgeon: Danie Binder, MD;  Location: [facility];  Service: Endoscopy;  Laterality: N/A;  12:45pm  . GIVENS CAPSULE STUDY N/A 12/11/2014   MULTILPLE EROSION IN the stomach WITH ACTIVE OOZING. OCCASIONAL EROSIONS AND RARE ULCER SEEN IN PROXIMAL SMALL BOWEL . No masses or AVMs SEEN. NO OLD BLOOD OR FRESH BLOOD SEEN.   . POLYPECTOMY N/A 11/24/2014   Procedure: POLYPECTOMY;  Surgeon: Danie Binder, MD;  Location: [facility];  Service: Endoscopy;  Laterality: N/A;  . SAVORY DILATION N/A 05/17/2016   Procedure: SAVORY DILATION;  Surgeon: Danie Binder, MD;  Location: [facility];  Service: Endoscopy;  Laterality: N/A;   Social History:  reports that she quit smoking about 21 years ago. Her smoking use included Cigarettes. She has a 5.00 pack-year smoking history. She has never used smokeless tobacco. She reports that she does not drink alcohol or use drugs.  Allergies  Allergen Reactions  . Sulfa Antibiotics Rash  . Sulfonamide Derivatives Rash    REACTION: rash    Family History  Problem Relation Age of Onset  . Hypertension Mother   . Colon cancer Neg Hx  Prior to Admission medications   Medication Sig Start Date End Date Taking? Authorizing Provider  acetaminophen (TYLENOL) 325 MG tablet Take 650 mg by mouth every 6 (six) hours as needed. For fever > 101     [provider]  ARIPiprazole (ABILIFY) 2 MG tablet Take 2 mg by mouth daily.  08/31/15   [provider]  calcium carbonate (TUMS - DOSED IN MG ELEMENTAL CALCIUM) 500 MG chewable tablet Chew 1 tablet by mouth  4 (four) times daily as needed for indigestion or heartburn.     [provider]  clonazePAM (KLONOPIN) 1 MG tablet Take 1 mg by mouth 3 (three) times daily.  10/08/14   [provider]  DULoxetine (CYMBALTA) 60 MG capsule Take 120 mg by mouth daily.     [provider]  enoxaparin (LOVENOX) 100 MG/ML injection Inject 1 mL (100 mg total) into the skin daily. Patient taking differently: Inject 100 mg into the skin daily. Starting on 05/04/2016-stopping on 06/07/2016 05/17/16 05/25/16  Danie Binder, MD  HYDROcodone-acetaminophen (NORCO) 5-325 MG per tablet Take 1 tablet by mouth 3 (three) times daily.     [provider]  hydrocortisone (ANUSOL-HC) 2.5 % rectal cream Place 1 application rectally 2 (two) times daily. 05/22/16   Annitta Needs, NP  Insulin Aspart (NOVOLOG FLEXPEN [place]) Inject 15-21 Units into the skin 3 (three) times daily before meals.    [provider]  Insulin Detemir (LEVEMIR FLEXTOUCH) 100 UNIT/ML Pen INJECT 50 UNITS SUBCUTANEOUSLY AT BEDTIME. 09/13/16   Nida, Marella Chimes, MD  iron polysaccharides (NIFEREX) 150 MG capsule Take 1 capsule (150 mg total) by mouth daily. 04/10/16   Baird Cancer, PA-C  latanoprost (XALATAN) 0.005 % ophthalmic solution Place 1 drop into both eyes at bedtime.    [provider]  loperamide (IMODIUM) 2 MG capsule Take 4 mg by mouth daily as needed for diarrhea or loose stools. Take 2 capsules initially then take 2 after each loose stool per [facility]    [provider]  metoCLOPramide (REGLAN) 5 MG tablet Take 1 tablet (5 mg total) by mouth every 8 (eight) hours as needed for nausea or vomiting (or bloating). 03/11/16   Francine Graven, DO  metoprolol succinate (TOPROL-XL) 25 MG 24 hr tablet Take 12.5 mg by mouth 2 (two) times daily.     [provider]  mirtazapine (REMERON) 30 MG tablet Take 30 mg by mouth at bedtime.      [provider]  naloxegol oxalate (MOVANTIK) 12.5 MG TABS  tablet Take 1 tablet (12.5 mg total) by mouth daily. 03/22/16   Fields, Marga Melnick, MD  omeprazole (PRILOSEC) 40 MG capsule Take 40 mg by mouth daily.    [provider]  ondansetron (ZOFRAN) 4 MG tablet Take 1 tablet (4 mg total) by mouth 4 (four) times daily -  before meals and at bedtime. 04/20/16   Annitta Needs, NP  polyethylene glycol powder (GLYCOLAX/MIRALAX) powder Take 17 g by mouth daily as needed.    [provider]  polyvinyl alcohol (ARTIFICIAL TEARS) 1.4 % ophthalmic solution Place 1 drop into both eyes 3 (three) times daily.    [provider]  promethazine (PHENERGAN) 12.5 MG tablet TAKE 1 TABLET BY MOUTH EVERY 6 HOURS AS NEEDED FOR NAUSEA OR VOMITING. 04/12/16   Mahala Menghini, PA-C  risperiDONE (RISPERDAL) 3 MG tablet Take 3 mg by mouth 2 (two) times daily.     [provider]  simvastatin (ZOCOR)  20 MG tablet Take 20 mg by mouth at bedtime.  03/31/16   [provider]  solifenacin (VESICARE) 5 MG tablet Take 5 mg by mouth daily.    [provider]  solifenacin (VESICARE) 5 MG tablet Take 5 mg by mouth daily.    [provider]  tiZANidine (ZANAFLEX) 2 MG tablet Take 2 mg by mouth daily.    [provider]  Vitamin D, Ergocalciferol, (DRISDOL) 50000 UNITS CAPS capsule Take 50,000 Units by mouth every Friday.     [provider]  warfarin (COUMADIN) 5 MG tablet Take 1 tablet daily except 1/2 tablet on Mondays, Wednesdays and Fridays 10/03/16   Satira Sark, MD  zolpidem (AMBIEN) 10 MG tablet Take 10 mg by mouth at bedtime.    [provider]   Physical Exam: Vitals:   10/06/16 0416 10/06/16 0430 10/06/16 0435 10/06/16 0557  BP:  131/61  119/74  Pulse:  77  73  Resp:  (!) 23  18  Temp:   98 F (36.7 C) 98.3 F (36.8 C)  TempSrc:    Oral  SpO2:  90%  96%  Weight: 70.8 kg (156 lb)     Height: 5\' 6"  (1.676 m)       Wt Readings from Last 3 Encounters:  10/06/16 70.8 kg (156 lb)    05/23/16 72.1 kg (159 lb)  05/16/16 71.2 kg (157 lb)    General:  Appears calm and comfortable; A&Ox3 Eyes:  PERRL, EOMI, normal lids, iris ENT:  grossly normal hearing, lips & tongue Neck:  no LAD, masses or thyromegaly Cardiovascular:  RRR, no m/r/g. No LE edema.  Respiratory:  CTA bilaterally, no w/r/r. Normal respiratory effort. Abdomen:  soft, ntnd Skin:  no rash or induration seen on limited exam Musculoskeletal:  grossly normal tone BUE; LLE weakness Psychiatric:  grossly normal mood and affect, speech fluent and appropriate Neurologic:  CN 2-12 grossly intact, moves all extremities in coordinated fashion. Slow speech.          Labs on Admission:  Basic Metabolic Panel:  Recent Labs Lab 10/06/16 0425  NA 138  K 4.1  CL 100*  CO2 29  GLUCOSE 234*  BUN 11  CREATININE 0.77  CALCIUM 8.8*   Liver Function Tests:  Recent Labs Lab 10/06/16 0425  AST 18  ALT 13*  ALKPHOS 80  BILITOT 0.3  PROT 6.9  ALBUMIN 3.5   No results for input(s): LIPASE, AMYLASE in the last 168 hours. No results for input(s): AMMONIA in the last 168 hours. CBC:  Recent Labs Lab 10/06/16 0425  WBC 6.4  NEUTROABS 3.9  HGB 12.8  HCT 37.4  MCV 88.6  PLT 231   Cardiac Enzymes: No results for input(s): CKTOTAL, CKMB, CKMBINDEX, TROPONINI in the last 168 hours.  BNP (last 3 results) No results for input(s): BNP in the last 8760 hours.  ProBNP (last 3 results) No results for input(s): PROBNP in the last 8760 hours.   Serum creatinine: 0.77 mg/dL 10/06/16 0425 Estimated creatinine clearance: 60.4 mL/min  CBG: No results for input(s): GLUCAP in the last 168 hours.  Radiological Exams on Admission: Dg Chest 1 View  Result Date: 10/06/2016 CLINICAL DATA:  Fall this morning with left hip pain.  Weakness. EXAM: CHEST 1 VIEW COMPARISON:  Radiograph 02/13/2016 FINDINGS: Stable cardiomegaly. Unchanged mediastinal contours. No pulmonary edema. No consolidation, pleural effusion, or  pneumothorax. No acute osseous abnormalities are seen. IMPRESSION: No acute abnormality. Electronically Signed   By: Threasa Beards  Ehinger M.D.   On: 10/06/2016 05:23   Ct Head Wo Contrast  Result Date: 10/06/2016 CLINICAL DATA:  Altered mental status after fall this morning. EXAM: CT HEAD WITHOUT CONTRAST TECHNIQUE: Contiguous axial images were obtained from the base of the skull through the vertex without intravenous contrast. COMPARISON:  Head CT 02/13/2016, brain MRI 05/23/2016 FINDINGS: Brain: Generalized atrophy. Stable degree of chronic small vessel ischemia from prior exam. Remote lacunar infarct in the left thalamus again seen. No intracranial hemorrhage, mass effect, or midline shift. No hydrocephalus. The basilar cisterns are patent. No evidence of territorial infarct or acute ischemia. No extra-axial or intracranial fluid collection. Vascular: Atherosclerosis of skullbase vasculature without hyperdense vessel or abnormal calcification. Skull: No skull fracture.  No focal lesion. Sinuses/Orbits: Chronic opacification of right mastoid air cells. Postsurgical change in the paranasal sinuses. No acute finding. Other: None. IMPRESSION: 1. No acute intracranial abnormality.  No skull fracture. 2. Stable atrophy and chronic small vessel ischemia. Electronically Signed   By: Jeb Levering M.D.   On: 10/06/2016 05:20   Dg Hip Unilat W Or Wo Pelvis 2-3 Views Left  Result Date: 10/06/2016 CLINICAL DATA:  Fall with morning with left hip pain. EXAM: DG HIP (WITH OR WITHOUT PELVIS) 2-3V LEFT COMPARISON:  None. FINDINGS: The cortical margins of the bony pelvis and left hip are intact. No fracture. Pubic symphysis and sacroiliac joints are congruent. Both femoral heads are well-seated in the respective acetabula. Age related degenerative change of both hips. IMPRESSION: No fracture of the pelvis or left hip. Electronically Signed   By: Jeb Levering M.D.   On: 10/06/2016 05:23    EKG: Independently reviewed.  NSR. No STEMI.  Assessment/Plan Principal Problem:   Left hemiparesis (HCC) Active Problems:   Type 2 diabetes mellitus with vascular disease (HCC)   Essential hypertension, benign   Chronic anticoagulation  NEEDS MED REC Completed  L sided weakness Patient admitted with a another episode of transient extremity weakness This occurred previously in January of this year CT head negative for acute bleed Differential diagnosis severe deconditioning, overmedication with narcotics, anxiety with ambulation or combination of issues May benefit from tele neuro or OP f/u on d/c to neuro MRI/MRA neg Aspirin full dose given  And daily A1c ordered Lipid panel ordered Admitted to telemetry bed Echo ordered Carotid Dopplers ordered UA neg Trop neg PT/OT/SLP eval  Hypertension When necessary hydralazine 10 mg IV as needed for severe blood pressure Cont toprol-xl  Mood disorder Cont risperdal, abilify, cymbalta, remeron No SI/HI  Anxiety Cont Klonopin  Hx of DVT Warfarin cont  DM Cont SSI AC Insulin Detemir 50u qhs  Hyperlipidemia Continue statin  Glaucoma Cont xalatan  Bladder dysfunction Cont Vesicare  Code Status: FULL DVT Prophylaxis: SCDs Family Communication: none at bedside Disposition Plan: Pending Improvement  Status: obs tele  Elwin Mocha, MD Family Medicine [facility] [email or web address] Password [email or web address]

## 2016-10-06 NOTE — Progress Notes (Signed)
Patient seen and examined at bedside. Please see earlier admission note by Dr. Aggie Moats. Patient admitted for evaluation of left-sided weakness, upper and lower extremity weakness. Patient has residual left-sided weakness from history of previous stroke. Patient to be admitted to telemetry unit for further observation. MRI of the brain done and with no evidence of acute stroke. No acute findings on echocardiogram or carotid Doppler. Awaiting for PT/OT evaluation.  Faye Ramsay, MD  [facility] Pager [phone]  If 7PM-7AM, please contact night-coverage [email or web address] Password [email or web address]

## 2016-10-07 DIAGNOSIS — I69354 Hemiplegia and hemiparesis following cerebral infarction affecting left non-dominant side: Secondary | ICD-10-CM | POA: Diagnosis not present

## 2016-10-07 DIAGNOSIS — G8194 Hemiplegia, unspecified affecting left nondominant side: Secondary | ICD-10-CM | POA: Diagnosis not present

## 2016-10-07 LAB — GLUCOSE, CAPILLARY
Glucose-Capillary: 180 mg/dL — ABNORMAL HIGH (ref 65–99)
Glucose-Capillary: 178 mg/dL — ABNORMAL HIGH (ref 65–99)

## 2016-10-07 LAB — CBC
HCT: 39.3 % (ref 36.0–46.0)
Hemoglobin: 13.6 g/dL (ref 12.0–15.0)
MCH: 30.8 pg (ref 26.0–34.0)
MCHC: 34.6 g/dL (ref 30.0–36.0)
MCV: 88.9 fL (ref 78.0–100.0)
Platelets: 225 10*3/uL (ref 150–400)
RBC: 4.42 MIL/uL (ref 3.87–5.11)
RDW: 12.6 % (ref 11.5–15.5)
WBC: 5.9 10*3/uL (ref 4.0–10.5)

## 2016-10-07 LAB — PROTIME-INR
INR: 2.35
Prothrombin Time: 25.5 seconds — ABNORMAL HIGH (ref 11.4–15.2)

## 2016-10-07 LAB — BASIC METABOLIC PANEL
Anion gap: 7 (ref 5–15)
BUN: 9 mg/dL (ref 6–20)
CO2: 29 mmol/L (ref 22–32)
Calcium: 8.5 mg/dL — ABNORMAL LOW (ref 8.9–10.3)
Chloride: 102 mmol/L (ref 101–111)
Creatinine, Ser: 0.65 mg/dL (ref 0.44–1.00)
GFR calc Af Amer: >60 mL/min (ref >60)
GFR calc non Af Amer: >60 mL/min (ref >60)
Glucose, Bld: 184 mg/dL — ABNORMAL HIGH (ref 65–99)
Potassium: 3.8 mmol/L (ref 3.5–5.1)
Sodium: 138 mmol/L (ref 135–145)

## 2016-10-07 LAB — HEMOGLOBIN A1C
Hgb A1c MFr Bld: 6.7 % — ABNORMAL HIGH (ref 4.8–5.6)
Mean Plasma Glucose: 145.59 mg/dL

## 2016-10-07 LAB — LIPID PANEL
Cholesterol: 133 mg/dL (ref 0–200)
HDL: 28 mg/dL — ABNORMAL LOW (ref >40)
LDL Cholesterol: 55 mg/dL (ref 0–99)
Total CHOL/HDL Ratio: 4.8 RATIO
Triglycerides: 252 mg/dL — ABNORMAL HIGH (ref <150)
VLDL: 50 mg/dL — ABNORMAL HIGH (ref 0–40)

## 2016-10-07 MED ORDER — CLONAZEPAM 0.5 MG PO TABS: 0.5000 mg | ORAL_TABLET | Freq: Four times a day (QID) | ORAL | 0 refills | Status: DC

## 2016-10-07 MED ORDER — HYDROCODONE-ACETAMINOPHEN 5-325 MG PO TABS: 1.0000 | ORAL_TABLET | Freq: Three times a day (TID) | ORAL | 0 refills | Status: DC

## 2016-10-07 MED ORDER — WARFARIN SODIUM 5 MG PO TABS: 5.0000 mg | ORAL_TABLET | Freq: Once | ORAL | Status: DC

## 2016-10-07 MED ORDER — ZOLPIDEM TARTRATE 10 MG PO TABS: 10.0000 mg | ORAL_TABLET | Freq: Every day | ORAL | 0 refills | Status: DC

## 2016-10-07 NOTE — Progress Notes (Signed)
SLP Cancellation Note  Patient Details Name: BAILYNN DYK MRN: [id number] DOB: 1946/01/05   Cancelled treatment:       Reason Eval/Treat Not Completed: SLP screened, no needs identified, will sign off; Pt reportedly back to baseline and MRI negative for acute changes. Discharge summary already written.  Thank you,  Genene Churn, [place]    [place] 10/07/2016, 1:11 PM

## 2016-10-07 NOTE — Discharge Instructions (Signed)
Weakness Weakness is a lack of strength. You may feel weak all over your body (generalized), or you may feel weak in one specific part of your body (focal). There are many potential causes of weakness. Sometimes, the cause of your weakness may not be known. Some causes of weakness can be serious, so it is important to see your doctor. Follow these instructions at home:  Rest as needed.  Try to get enough sleep. Talk to your doctor about how much sleep you need each night.  Take over-the-counter and prescription medicines only as told by your doctor.  Eat a healthy, well-balanced diet. This includes: ? Proteins to build muscles, such as lean meats and fish. ? Fresh fruits and vegetables. ? Carbohydrates to boost energy, such as whole grains.  Drink enough fluid to keep your pee (urine) clear or pale yellow.  Do strength exercises, such as arm curls and leg raises, for 30 minutes at least 2 days a week or as told by your doctor.  Think about working with a physical therapist or trainer to help you get stronger.  Keep all follow-up visits as told by your doctor. This is important. Contact a doctor if:  Your weakness does not get better or it gets worse.  Your weakness affects your ability to: ? Think clearly. ? Do your normal daily activities. Get help right away if:  You have sudden weakness.  You have trouble breathing or shortness of breath.  You have problems with your vision.  You have trouble talking or swallowing.  You have trouble standing or walking.  You have chest pain.  You are light-headed.  You pass out (lose consciousness). This information is not intended to replace advice given to you by your health care provider. Make sure you discuss any questions you have with your health care provider. Document Released: 12/23/2007 Document Revised: 02/04/2015 Document Reviewed: 10/30/2014 Elsevier Interactive Patient Education  2018 [employer]  

## 2016-10-07 NOTE — Discharge Summary (Signed)
Physician Discharge Summary  [name] WNU:[id number] DOB: 1945-06-14 DOA: 10/06/2016  PCP: [name], MD  Admit date: 10/06/2016 Discharge date: 10/07/2016  Recommendations for Outpatient Follow-up:  1. Pt will need to follow up with PCP in 1-2 weeks post discharge 2. Please obtain BMP to evaluate electrolytes and kidney function 3. Please also check CBC to evaluate Hg and Hct levels  Discharge Diagnoses:  Principal Problem:   Left hemiparesis ([place]) Active Problems:   Type 2 diabetes mellitus with vascular disease (HCC)   Essential hypertension, benign   Chronic anticoagulation   Left-sided weakness   Discharge Condition: Stable  Diet recommendation: Heart healthy diet discussed in details   History of present illness:  71 y.o. female  with known history of prior stroke and residual left-sided weakness, depression, hypertension, schizophrenia, Presents to emergency department with main concern of fall due to increased weakness. Patient reports being ambulatory with a walker and suddenly felt weakness on the left side and slid to the floor. Upon arrival to emergency department patient demonstrated weakness on the left side upper extremity worse than lower extremity, questionable tongue deviation. Patient was out of the window for TPA, patient admitted for stroke rule out.  Hospital Course:  Principal Problem:   Left hemiparesis ([place]), left upper extremity worse than left lower extremity - Symptoms have resolved since admission - Range of motion limited in left lower extremity but suspect this is due to pain rather than weakness - MRI of the brain ruled out acute stroke - Neurologist reviewed carotid Dopplers and MRI - Per neurologist, current findings on MRI would not explain patient's symptoms - TIA is certainly possible - Continue with risk factors management, statin for hyperlipidemia, blood pressure control - Patient already anticoagulated with Coumadin - Per  neurology, continue same regimen for now  Active Problems:   Type 2 diabetes mellitus with vascular disease ([place]) - Continue insulin per home medical regimen    Essential hypertension, benign - Continue home medical    Schizophrenia - At baseline mental state - Continue previous home medical   Procedures/Studies: Dg Chest 1 View  Result Date: 10/06/2016 CLINICAL DATA:  Fall this morning with left hip pain.  Weakness. EXAM: CHEST 1 VIEW COMPARISON:  Radiograph 02/13/2016 FINDINGS: Stable cardiomegaly. Unchanged mediastinal contours. No pulmonary edema. No consolidation, pleural effusion, or pneumothorax. No acute osseous abnormalities are seen. IMPRESSION: No acute abnormality. Electronically Signed   By: Jeb Levering M.D.   On: 10/06/2016 05:23   Ct Head Wo Contrast  Result Date: 10/06/2016 CLINICAL DATA:  Altered mental status after fall this morning. EXAM: CT HEAD WITHOUT CONTRAST TECHNIQUE: Contiguous axial images were obtained from the base of the skull through the vertex without intravenous contrast. COMPARISON:  Head CT 02/13/2016, brain MRI 05/23/2016 FINDINGS: Brain: Generalized atrophy. Stable degree of chronic small vessel ischemia from prior exam. Remote lacunar infarct in the left thalamus again seen. No intracranial hemorrhage, mass effect, or midline shift. No hydrocephalus. The basilar cisterns are patent. No evidence of territorial infarct or acute ischemia. No extra-axial or intracranial fluid collection. Vascular: Atherosclerosis of skullbase vasculature without hyperdense vessel or abnormal calcification. Skull: No skull fracture.  No focal lesion. Sinuses/Orbits: Chronic opacification of right mastoid air cells. Postsurgical change in the paranasal sinuses. No acute finding. Other: None. IMPRESSION: 1. No acute intracranial abnormality.  No skull fracture. 2. Stable atrophy and chronic small vessel ischemia. Electronically Signed   By: Jeb Levering M.D.   On:  10/06/2016 05:20  Mr Brain Wo Contrast  Result Date: 10/06/2016 CLINICAL DATA:  Possible stroke. Pain in the left hip and leg with increased weakness. EXAM: MRI HEAD WITHOUT CONTRAST MRA HEAD WITHOUT CONTRAST TECHNIQUE: Multiplanar, multiecho pulse sequences of the brain and surrounding structures were obtained without intravenous contrast. Angiographic images of the head were obtained using MRA technique without contrast. COMPARISON:  05/23/2016 FINDINGS: MRI HEAD FINDINGS Brain: No acute infarction, hemorrhage, hydrocephalus, extra-axial collection or mass lesion. Advanced chronic small vessel ischemia with confluent gliosis in the deep white matter and remote lacunar infarcts in the left centrum semiovale and right periatrial white matter. Gliotic signal extends into the temporal lobes, without cystic change. Remote micro hemorrhages in the left thalamus and left caudate, hypertensive locations. A least 1 lobar microhemorrhage in the right occipital region. Vascular: Arterial findings below. Normal dural venous sinus flow voids. Skull and upper cervical spine: Negative for marrow lesion. Hyperostosis interna. Sinuses/Orbits: Changes of endoscopic sinus surgery. No active inflammation in the paranasal sinuses. Chronic right mastoid opacification with negative nasopharynx. MRA HEAD FINDINGS Symmetric carotid arteries. Mild right vertebral artery dominance. There is a conical outpouching at the ophthalmic segment right ICA measuring 2 mm, shape suggesting infundibulum. Mild to moderate atherosclerotic irregularity of bilateral MCA branches. No proximal, correctable stenosis in the anterior circulation. Both vertebral arteries and the basilar are smooth and widely patent. There is a hypoplastic right P1 segment. Bilateral prominent P2 segment stenoses. Poor downstream PCA opacification is likely accentuated by vessel direction. IMPRESSION: Brain MRI: 1. No acute finding. 2. Advanced chronic small vessel ischemic  in the cerebral white matter. Intracranial MRA: 1. No acute finding. No flow limiting stenosis in the major intracranial vessels. 2. Intracranial atherosclerosis with advanced stenoses in the bilateral PCA distribution. 3. 2 mm outpouching from the right ophthalmic segment ICA, shape suggesting infundibulum. Electronically Signed   By: Monte Fantasia M.D.   On: 10/06/2016 07:14   US Carotid Bilateral (at [facility] And [facility] Only)  Result Date: 10/06/2016 CLINICAL DATA:  Left hemi paresis.  Altered mental status. EXAM: BILATERAL CAROTID DUPLEX ULTRASOUND TECHNIQUE: Pearline Cables scale imaging, color Doppler and duplex ultrasound were performed of bilateral carotid and vertebral arteries in the neck. COMPARISON:  None. FINDINGS: Criteria: Quantification of carotid stenosis is based on velocity parameters that correlate the residual internal carotid diameter with NASCET-based stenosis levels, using the diameter of the distal internal carotid lumen as the denominator for stenosis measurement. The following velocity measurements were obtained: RIGHT ICA:  102 cm/sec CCA:  77 cm/sec SYSTOLIC ICA/CCA RATIO:  1.3 DIASTOLIC ICA/CCA RATIO:  2.3 ECA:  79 cm/sec LEFT ICA:  75 cm/sec CCA:  82 cm/sec SYSTOLIC ICA/CCA RATIO:  0.9 DIASTOLIC ICA/CCA RATIO:  0.6 ECA:  102 cm/sec RIGHT CAROTID ARTERY: Mild calcified plaque in the bulb. Low resistance internal carotid Doppler pattern. RIGHT VERTEBRAL ARTERY:  Antegrade. LEFT CAROTID ARTERY: Mild focal calcified plaque in the bulb. Low resistance internal carotid Doppler pattern is preserved. LEFT VERTEBRAL ARTERY:  Antegrade. IMPRESSION: Less than 50% stenosis in the right and left internal carotid arteries. Electronically Signed   By: Marybelle Killings M.D.   On: 10/06/2016 07:47   Mr Jodene Nam Head/brain VQ Cm  Result Date: 10/06/2016 CLINICAL DATA:  Possible stroke. Pain in the left hip and leg with increased weakness. EXAM: MRI HEAD WITHOUT CONTRAST MRA HEAD WITHOUT CONTRAST TECHNIQUE: Multiplanar,  multiecho pulse sequences of the brain and surrounding structures were obtained without intravenous contrast. Angiographic images of the head were obtained using MRA  technique without contrast. COMPARISON:  05/23/2016 FINDINGS: MRI HEAD FINDINGS Brain: No acute infarction, hemorrhage, hydrocephalus, extra-axial collection or mass lesion. Advanced chronic small vessel ischemia with confluent gliosis in the deep white matter and remote lacunar infarcts in the left centrum semiovale and right periatrial white matter. Gliotic signal extends into the temporal lobes, without cystic change. Remote micro hemorrhages in the left thalamus and left caudate, hypertensive locations. A least 1 lobar microhemorrhage in the right occipital region. Vascular: Arterial findings below. Normal dural venous sinus flow voids. Skull and upper cervical spine: Negative for marrow lesion. Hyperostosis interna. Sinuses/Orbits: Changes of endoscopic sinus surgery. No active inflammation in the paranasal sinuses. Chronic right mastoid opacification with negative nasopharynx. MRA HEAD FINDINGS Symmetric carotid arteries. Mild right vertebral artery dominance. There is a conical outpouching at the ophthalmic segment right ICA measuring 2 mm, shape suggesting infundibulum. Mild to moderate atherosclerotic irregularity of bilateral MCA branches. No proximal, correctable stenosis in the anterior circulation. Both vertebral arteries and the basilar are smooth and widely patent. There is a hypoplastic right P1 segment. Bilateral prominent P2 segment stenoses. Poor downstream PCA opacification is likely accentuated by vessel direction. IMPRESSION: Brain MRI: 1. No acute finding. 2. Advanced chronic small vessel ischemic in the cerebral white matter. Intracranial MRA: 1. No acute finding. No flow limiting stenosis in the major intracranial vessels. 2. Intracranial atherosclerosis with advanced stenoses in the bilateral PCA distribution. 3. 2 mm  outpouching from the right ophthalmic segment ICA, shape suggesting infundibulum. Electronically Signed   By: Monte Fantasia M.D.   On: 10/06/2016 07:14   Dg Hip Unilat W Or Wo Pelvis 2-3 Views Left  Result Date: 10/06/2016 CLINICAL DATA:  Fall with morning with left hip pain. EXAM: DG HIP (WITH OR WITHOUT PELVIS) 2-3V LEFT COMPARISON:  None. FINDINGS: The cortical margins of the bony pelvis and left hip are intact. No fracture. Pubic symphysis and sacroiliac joints are congruent. Both femoral heads are well-seated in the respective acetabula. Age related degenerative change of both hips. IMPRESSION: No fracture of the pelvis or left hip. Electronically Signed   By: Jeb Levering M.D.   On: 10/06/2016 05:23     Discharge Exam: Vitals:   10/07/16 0159 10/07/16 0459  BP: (!) 141/60 (!) 157/67  Pulse: 72 70  Resp: 16 15  Temp: 98.3 F (36.8 C) 98.5 F (36.9 C)  SpO2: 94% 96%   Vitals:   10/06/16 1359 10/06/16 2159 10/07/16 0159 10/07/16 0459  BP: (!) 143/67 127/68 (!) 141/60 (!) 157/67  Pulse: 67 74 72 70  Resp: 20 20 16 15   Temp: 97.8 F (36.6 C) 98.5 F (36.9 C) 98.3 F (36.8 C) 98.5 F (36.9 C)  TempSrc: Oral Oral Oral Oral  SpO2: 96% 95% 94% 96%  Weight: 74.6 kg (164 lb 8 oz)     Height: 5\' 6"  (1.676 m)       General: Pt is alert, follows commands appropriately, not in acute distress Cardiovascular: Regular rate and rhythm, S1/S2 +, no rubs, no gallops Respiratory: Clear to auscultation bilaterally Abdominal: Soft, non tender, non distended, bowel sounds +, no guarding Extremities: no edema, no cyanosis, pulses palpable bilaterally, ROM limited with LLE due to pain  Neuro: Grossly nonfocal  Discharge Instructions  Discharge Instructions    Diet - low sodium heart healthy    Complete by:  As directed    Increase activity slowly    Complete by:  As directed      Allergies as of  10/07/2016      Reactions   Sulfa Antibiotics Rash   Sulfonamide Derivatives Rash    REACTION: rash      Medication List    TAKE these medications   acetaminophen 325 MG tablet Commonly known as:  TYLENOL Take 650 mg by mouth every 6 (six) hours as needed. For fever > 101   ARIPiprazole 2 MG tablet Commonly known as:  ABILIFY Take 2 mg by mouth daily.   ARTIFICIAL TEARS 1.4 % ophthalmic solution Generic drug:  polyvinyl alcohol Place 1 drop into both eyes 3 (three) times daily.   calcium carbonate 500 MG chewable tablet Commonly known as:  TUMS - dosed in mg elemental calcium Chew 1 tablet by mouth 4 (four) times daily as needed for indigestion or heartburn.   clonazePAM 0.5 MG tablet Commonly known as:  KLONOPIN Take 1 tablet (0.5 mg total) by mouth 4 (four) times daily.   DULoxetine 60 MG capsule Commonly known as:  CYMBALTA Take 120 mg by mouth daily.   HYDROcodone-acetaminophen 5-325 MG tablet Commonly known as:  NORCO/VICODIN Take 1 tablet by mouth 3 (three) times daily.   hydrocortisone 2.5 % rectal cream Commonly known as:  ANUSOL-HC Place 1 application rectally 2 (two) times daily.   Insulin Detemir 100 UNIT/ML Pen Commonly known as:  LEVEMIR FLEXTOUCH INJECT 50 UNITS SUBCUTANEOUSLY AT BEDTIME.   iron polysaccharides 150 MG capsule Commonly known as:  NIFEREX Take 1 capsule (150 mg total) by mouth daily.   latanoprost 0.005 % ophthalmic solution Commonly known as:  XALATAN Place 1 drop into both eyes at bedtime.   loperamide 2 MG capsule Commonly known as:  IMODIUM Take 4 mg by mouth daily as needed for diarrhea or loose stools. Take 2 capsules initially then take 2 after each loose stool per MAR   metoCLOPramide 5 MG tablet Commonly known as:  REGLAN Take 1 tablet (5 mg total) by mouth every 8 (eight) hours as needed for nausea or vomiting (or bloating).   metoprolol succinate 25 MG 24 hr tablet Commonly known as:  TOPROL-XL Take 12.5 mg by mouth 2 (two) times daily.   mirtazapine 30 MG tablet Commonly known as:  REMERON Take  30 mg by mouth at bedtime.   MOVANTIK 12.5 MG Tabs tablet Generic drug:  naloxegol oxalate Take 12.5 mg by mouth daily.   NOVOLOG FLEXPEN [place] Inject 15-21 Units into the skin 3 (three) times daily before meals.   omeprazole 40 MG capsule Commonly known as:  PRILOSEC Take 40 mg by mouth daily.   ondansetron 4 MG tablet Commonly known as:  ZOFRAN Take 1 tablet (4 mg total) by mouth 4 (four) times daily -  before meals and at bedtime.   polyethylene glycol powder powder Commonly known as:  GLYCOLAX/MIRALAX Take 17 g by mouth daily as needed.   promethazine 12.5 MG tablet Commonly known as:  PHENERGAN TAKE 1 TABLET BY MOUTH EVERY 6 HOURS AS NEEDED FOR NAUSEA OR VOMITING.   risperiDONE 3 MG tablet Commonly known as:  RISPERDAL Take 3 mg by mouth 2 (two) times daily.   simvastatin 20 MG tablet Commonly known as:  ZOCOR Take 20 mg by mouth at bedtime.   solifenacin 5 MG tablet Commonly known as:  VESICARE Take 5 mg by mouth daily.   tiZANidine 2 MG tablet Commonly known as:  ZANAFLEX Take 2 mg by mouth daily.   Vitamin D (Ergocalciferol) 50000 units Caps capsule Commonly known as:  DRISDOL Take 50,000 Units  by mouth every Friday.   warfarin 5 MG tablet Commonly known as:  COUMADIN Take 1 tablet daily except 1/2 tablet on Mondays, Wednesdays and Fridays   zolpidem 10 MG tablet Commonly known as:  AMBIEN Take 1 tablet (10 mg total) by mouth at bedtime.            Discharge Care Instructions        Start     Ordered   10/07/16 0000  clonazePAM (KLONOPIN) 0.5 MG tablet  4 times daily     10/07/16 1132   10/07/16 0000  HYDROcodone-acetaminophen (NORCO/VICODIN) 5-325 MG tablet  3 times daily     10/07/16 1132   10/07/16 0000  zolpidem (AMBIEN) 10 MG tablet  Daily at bedtime     10/07/16 1132   10/07/16 0000  Increase activity slowly     10/07/16 1132   10/07/16 0000  Diet - low sodium heart healthy     10/07/16 1132     Follow-up Information    [name], MD Follow up.   Specialty:  Internal Medicine Contact information: [place] [address] [phone]            The results of significant diagnostics from this hospitalization (including imaging, microbiology, ancillary and laboratory) are listed below for reference.     Microbiology: Recent Results (from the past 240 hour(s))  MRSA PCR Screening     Status: Abnormal   Collection Time: 10/06/16  6:21 PM  Result Value Ref Range Status   MRSA by PCR POSITIVE (A) NEGATIVE Final    Comment:        The GeneXpert MRSA Assay (FDA approved for NASAL specimens only), is one component of a comprehensive MRSA colonization surveillance program. It is not intended to diagnose MRSA infection nor to guide or monitor treatment for MRSA infections. RESULT CALLED TO, READ BACK BY AND VERIFIED WITH: [email or web address].  AT 2137 ON 10/06/2016 BY EVA      Labs: Basic Metabolic Panel:  Recent Labs Lab 10/06/16 0425 10/07/16 0620  NA 138 138  K 4.1 3.8  CL 100* 102  CO2 29 29  GLUCOSE 234* 184*  BUN 11 9  CREATININE 0.77 0.65  CALCIUM 8.8* 8.5*   Liver Function Tests:  Recent Labs Lab 10/06/16 0425  AST 18  ALT 13*  ALKPHOS 80  BILITOT 0.3  PROT 6.9  ALBUMIN 3.5   CBC:  Recent Labs Lab 10/06/16 0425 10/07/16 0620  WBC 6.4 5.9  NEUTROABS 3.9  --   HGB 12.8 13.6  HCT 37.4 39.3  MCV 88.6 88.9  PLT 231 225   CBG:  Recent Labs Lab 10/06/16 1138 10/06/16 2135 10/07/16 0716 10/07/16 1111  GLUCAP 154* 208* 180* 178*    SIGNED: Time coordinating discharge: 60 minutes  Faye Ramsay, MD  [facility] 10/07/2016, 11:33 AM Pager [phone]  If 7PM-7AM, please contact night-coverage [email or web address] Password [email or web address]

## 2016-10-07 NOTE — Clinical Social Work Note (Signed)
Clinical Social Worker facilitated patient discharge including contacting patient family and facility ([facility]) to confirm patient discharge plans. Clinical information faxed to facility and family agreeable with plan. CSW arranged ambulance transport via [facility] to [facility]. RN to call report to [phone] prior to discharge.  Clinical Social Worker will sign off for now as social work intervention is no longer needed. Please consult [place] again if new need arises.  Saniyyah Elster B. Joline Maxcy Clinical Social Work Dept Weekend Social Worker [phone] 12:53 PM

## 2016-10-07 NOTE — Progress Notes (Signed)
ANTICOAGULATION CONSULT NOTE - Initial Consult  Pharmacy Consult for Warfarin Indication: DVT (long term therapy)  Allergies  Allergen Reactions  . Sulfa Antibiotics Rash  . Sulfonamide Derivatives Rash    REACTION: rash    Patient Measurements: Height: 5\' 6"  (167.6 cm) Weight: 164 lb 8 oz (74.6 kg) IBW/kg (Calculated) : 59.3 Heparin Dosing Weight:   Vital Signs: Temp: 98.5 F (36.9 C) (09/15 0459) Temp Source: Oral (09/15 0459) BP: 157/67 (09/15 0459) Pulse Rate: 70 (09/15 0459)  Labs:  Recent Labs  10/06/16 0425 10/07/16 0620  HGB 12.8 13.6  HCT 37.4 39.3  PLT 231 225  APTT 41*  --   LABPROT 28.2* 25.5*  INR 2.67 2.35  CREATININE 0.77 0.65    Estimated Creatinine Clearance: 66.6 mL/min (by C-G formula based on SCr of 0.65 mg/dL).   Medical History: Past Medical History:  Diagnosis Date  . Anxiety   . Arthritis   . Depression    History of psychosis and previous suicide attempt  . DVT, lower extremity, recurrent (HCC)    Long-term Coumadin per Dr. Legrand Rams  . Essential hypertension   . GERD (gastroesophageal reflux disease)   . Hemiplegia ([place]) 2010   Left side  . History of stroke    Acute infarct and right cerebral white matter small vessel disease 12/10  . Leg DVT (deep venous thromboembolism), acute ([place]) 2006  . Schizophrenia ([place])   . Stroke [facility])    left sided weakness  . Type 2 diabetes mellitus (HCC)     Medications:  Prescriptions Prior to Admission  Medication Sig Dispense Refill Last Dose  . acetaminophen (TYLENOL) 325 MG tablet Take 650 mg by mouth every 6 (six) hours as needed. For fever > 101    unknown  . ARIPiprazole (ABILIFY) 2 MG tablet Take 2 mg by mouth daily.    10/05/2016 at Unknown time  . calcium carbonate (TUMS - DOSED IN MG ELEMENTAL CALCIUM) 500 MG chewable tablet Chew 1 tablet by mouth 4 (four) times daily as needed for indigestion or heartburn.    unknown  . clonazePAM (KLONOPIN) 0.5 MG tablet Take 0.5 mg by mouth 4  (four) times daily.   10/05/2016 at Unknown time  . DULoxetine (CYMBALTA) 60 MG capsule Take 120 mg by mouth daily.    10/05/2016 at Unknown time  . HYDROcodone-acetaminophen (NORCO) 5-325 MG per tablet Take 1 tablet by mouth 3 (three) times daily.    10/06/2016 at Unknown time  . hydrocortisone (ANUSOL-HC) 2.5 % rectal cream Place 1 application rectally 2 (two) times daily. 30 g 1 unknown  . Insulin Aspart (NOVOLOG FLEXPEN [place]) Inject 15-21 Units into the skin 3 (three) times daily before meals.   10/05/2016 at Unknown time  . Insulin Detemir (LEVEMIR FLEXTOUCH) 100 UNIT/ML Pen INJECT 50 UNITS SUBCUTANEOUSLY AT BEDTIME. 15 mL 2 10/05/2016 at Unknown time  . iron polysaccharides (NIFEREX) 150 MG capsule Take 1 capsule (150 mg total) by mouth daily. 30 capsule 11 10/05/2016 at Unknown time  . latanoprost (XALATAN) 0.005 % ophthalmic solution Place 1 drop into both eyes at bedtime.   10/05/2016 at Unknown time  . loperamide (IMODIUM) 2 MG capsule Take 4 mg by mouth daily as needed for diarrhea or loose stools. Take 2 capsules initially then take 2 after each loose stool per MAR   unknown  . metoCLOPramide (REGLAN) 5 MG tablet Take 1 tablet (5 mg total) by mouth every 8 (eight) hours as needed for nausea or vomiting (or bloating).  5 tablet 0 unknown  . metoprolol succinate (TOPROL-XL) 25 MG 24 hr tablet Take 12.5 mg by mouth 2 (two) times daily.    10/05/2016 at 2000  . mirtazapine (REMERON) 30 MG tablet Take 30 mg by mouth at bedtime.     10/05/2016 at Unknown time  . naloxegol oxalate (MOVANTIK) 12.5 MG TABS tablet Take 12.5 mg by mouth daily.   10/05/2016 at Unknown time  . omeprazole (PRILOSEC) 40 MG capsule Take 40 mg by mouth daily.   10/05/2016 at Unknown time  . ondansetron (ZOFRAN) 4 MG tablet Take 1 tablet (4 mg total) by mouth 4 (four) times daily -  before meals and at bedtime. 120 tablet 3 unknown  . polyethylene glycol powder (GLYCOLAX/MIRALAX) powder Take 17 g by mouth daily as needed.   unknown  .  polyvinyl alcohol (ARTIFICIAL TEARS) 1.4 % ophthalmic solution Place 1 drop into both eyes 3 (three) times daily.   10/05/2016 at Unknown time  . promethazine (PHENERGAN) 12.5 MG tablet TAKE 1 TABLET BY MOUTH EVERY 6 HOURS AS NEEDED FOR NAUSEA OR VOMITING. 30 tablet 0 unknown  . risperiDONE (RISPERDAL) 3 MG tablet Take 3 mg by mouth 2 (two) times daily.    10/05/2016 at Unknown time  . simvastatin (ZOCOR) 20 MG tablet Take 20 mg by mouth at bedtime.    10/05/2016 at Unknown time  . solifenacin (VESICARE) 5 MG tablet Take 5 mg by mouth daily.   10/05/2016 at Unknown time  . tiZANidine (ZANAFLEX) 2 MG tablet Take 2 mg by mouth daily.   10/05/2016 at Unknown time  . Vitamin D, Ergocalciferol, (DRISDOL) 50000 UNITS CAPS capsule Take 50,000 Units by mouth every Friday.    Past Week at Unknown time  . warfarin (COUMADIN) 5 MG tablet Take 1 tablet daily except 1/2 tablet on Mondays, Wednesdays and Fridays 28 tablet 4 10/05/2016 at Unknown time  . zolpidem (AMBIEN) 10 MG tablet Take 10 mg by mouth at bedtime.   10/05/2016 at Unknown time    Assessment: Okay for Protocol, INR at goal.  Goal of Therapy:  INR 2-3   Plan:  Warfarin 5mg  PO x1 Daily PT/INR Monitor for signs and symptoms of bleeding.   Isac Sarna, BS Vena [place], BCPS Clinical Pharmacist Pager [phone] 10/07/2016,9:51 AM

## 2016-10-16 ENCOUNTER — Telehealth: Payer: Self-pay

## 2016-10-16 ENCOUNTER — Encounter: Payer: Self-pay | Admitting: Nurse Practitioner

## 2016-10-16 ENCOUNTER — Ambulatory Visit (INDEPENDENT_AMBULATORY_CARE_PROVIDER_SITE_OTHER): Payer: Medicare Other | Admitting: Nurse Practitioner

## 2016-10-16 VITALS — BP 100/59 | HR 81 | Temp 96.8°F | Ht 66.0 in

## 2016-10-16 DIAGNOSIS — B351 Tinea unguium: Secondary | ICD-10-CM | POA: Diagnosis not present

## 2016-10-16 DIAGNOSIS — K625 Hemorrhage of anus and rectum: Secondary | ICD-10-CM

## 2016-10-16 DIAGNOSIS — M79675 Pain in left toe(s): Secondary | ICD-10-CM | POA: Diagnosis not present

## 2016-10-16 DIAGNOSIS — K582 Mixed irritable bowel syndrome: Secondary | ICD-10-CM

## 2016-10-16 DIAGNOSIS — M79674 Pain in right toe(s): Secondary | ICD-10-CM | POA: Diagnosis not present

## 2016-10-16 DIAGNOSIS — R112 Nausea with vomiting, unspecified: Secondary | ICD-10-CM

## 2016-10-16 DIAGNOSIS — K589 Irritable bowel syndrome without diarrhea: Secondary | ICD-10-CM | POA: Insufficient documentation

## 2016-10-16 NOTE — Assessment & Plan Note (Signed)
The patient does have some nausea while on iron. Zofran seems to work well for her. Minimal to no vomiting ongoing. Recommend continue Zofran as needed. Call if any worsening symptoms. Return for follow-up in 6 months.

## 2016-10-16 NOTE — Assessment & Plan Note (Signed)
Given lower crampy abdominal pain as well as mostly constipation and occasionally diarrhea (1 week out of 4), the patient likely has irritable bowel syndrome mixed type. I discussed with her and the staph accompanying her to take Movantik when constipated. When she develops diarrhea hold the Movantik and start Imodium. When diarrhea stops and returns to constipation hold the Imodium and restart the Movantik. She is on chronic pain medications which is likely exacerbating her constipation. Call with any worsening symptoms. Return for follow-up in 6 months.

## 2016-10-16 NOTE — Assessment & Plan Note (Signed)
No further rectal bleeding with improvement in constipation on Movantik. Continue to monitor. Return for follow-up in 6 months.

## 2016-10-16 NOTE — Progress Notes (Signed)
Referring Provider: [name], MD Primary Care Physician:  [name], MD Primary GI:  Dr. Oneida Alar  Chief Complaint  Patient presents with  . Emesis    pp f/u, doing ok    HPI:   [name] is a 71 y.o. female who presents For 3 month post procedure follow-up. The patient was last seen in our office 04/20/2016 for non-intractable nausea and vomiting. Noted history of IDA. IDA after colonoscopy/EGD/capsule study completed November 2016 felt to be related to erosive gastropathy and enteropathy, next due for colonoscopy 2019. Abnormal CT of the abdomen February 2018 and follow-up MRI demonstrated right lobe liver hemangioma. Constipation, added Movantik. Noted progressive weight loss since 2017, persistent nausea typically only able to use yogurt and vanilla wafers. Overall inability to tolerate foods other than soft textures and has associated vomiting, difficulty starting. History of patent stricture at the GE junction. Zofran changed to scheduled before meals. Recommended upper endoscopy with possible dilation on Lovenox bridge.  EGD report reviewed. EGD completed 05/17/2016 which found benign appearing esophageal stenosis, small hiatal hernia, a few gastric polyps, chronic gastritis. Lovenox and Coumadin guidance were provided, await pathology results.  Pathology results reviewed. Stomach polyps found to be fundic gland polyp, chronic gastritis negative for H. Pylori.  Follow-up phone call and indicated constipation for which we recommended MiraLAX as needed for constipation in addition to Anusol. MiraLAX was canceled due to persistent diarrhea. She was a no-show to her appointment 08/16/2016.  Today she is accompanied by facility staff. She states she is doing well overall. Occasional/rare nausea. States she has constipation, I discussed the facility note that she tends to have diarrhea. Staff says she alternates between diarrhea and constipation. Stool softener tends to  helps. Will have diarrhea about every 2 weeks and lasts for several days. Has abdominal pain lower abdomen which improves "a little bit" with bowel movement. Denies hematochezia. Darkened stools on iron. Denies chest pain, dyspnea, dizziness, lightheadedness, syncope, near syncope. Denies any other upper or lower GI symptoms.  She does receive Imodium for diarrhea which she says helps. Occasional nausea on iron but Zofran helps (per the patient).  BP 100/59 which is baseline for her.  Past Medical History:  Diagnosis Date  . Anxiety   . Arthritis   . Depression    History of psychosis and previous suicide attempt  . DVT, lower extremity, recurrent (HCC)    Long-term Coumadin per Dr. Legrand Rams  . Essential hypertension   . GERD (gastroesophageal reflux disease)   . Hemiplegia ([place]) 2010   Left side  . History of stroke    Acute infarct and right cerebral white matter small vessel disease 12/10  . Leg DVT (deep venous thromboembolism), acute ([place]) 2006  . Schizophrenia ([place])   . Stroke [facility])    left sided weakness  . Type 2 diabetes mellitus ([place])     Past Surgical History:  Procedure Laterality Date  . BACK SURGERY    . BIOPSY N/A 11/24/2014   Procedure: BIOPSY;  Surgeon: Danie Binder, MD;  Location: [facility];  Service: Endoscopy;  Laterality: N/A;  . BIOPSY  05/17/2016   Procedure: BIOPSY;  Surgeon: Danie Binder, MD;  Location: [facility];  Service: Endoscopy;;  gastric biopsy  . COLONOSCOPY WITH PROPOFOL N/A 11/24/2014   Dr. Rudie Meyer polyps removed/moderate sized internal hemorrhoids, tubular adenomas. Next surveillance in 3 years  . ESOPHAGOGASTRODUODENOSCOPY (EGD) WITH PROPOFOL N/A 11/24/2014   Dr. Clayburn Pert HH/patent stricture at the gastroesophageal  junction, mild non-erosive gastritis, path negative for H.pylori or celiac sprue  . ESOPHAGOGASTRODUODENOSCOPY (EGD) WITH PROPOFOL N/A 05/17/2016   Procedure: ESOPHAGOGASTRODUODENOSCOPY (EGD) WITH PROPOFOL;  Surgeon: Danie Binder, MD;  Location: [facility];  Service: Endoscopy;  Laterality: N/A;  12:45pm  . GIVENS CAPSULE STUDY N/A 12/11/2014   MULTILPLE EROSION IN the stomach WITH ACTIVE OOZING. OCCASIONAL EROSIONS AND RARE ULCER SEEN IN PROXIMAL SMALL BOWEL . No masses or AVMs SEEN. NO OLD BLOOD OR FRESH BLOOD SEEN.   . POLYPECTOMY N/A 11/24/2014   Procedure: POLYPECTOMY;  Surgeon: Danie Binder, MD;  Location: [facility];  Service: Endoscopy;  Laterality: N/A;  . SAVORY DILATION N/A 05/17/2016   Procedure: SAVORY DILATION;  Surgeon: Danie Binder, MD;  Location: [facility];  Service: Endoscopy;  Laterality: N/A;    Current Outpatient Prescriptions  Medication Sig Dispense Refill  . acetaminophen (TYLENOL) 325 MG tablet Take 650 mg by mouth every 6 (six) hours as needed. For fever > 101     . ARIPiprazole (ABILIFY) 2 MG tablet Take 2 mg by mouth daily.     . calcium carbonate (TUMS - DOSED IN MG ELEMENTAL CALCIUM) 500 MG chewable tablet Chew 1 tablet by mouth 4 (four) times daily as needed for indigestion or heartburn.     . clonazePAM (KLONOPIN) 0.5 MG tablet Take 1 tablet (0.5 mg total) by mouth 4 (four) times daily. 30 tablet 0  . DULoxetine (CYMBALTA) 60 MG capsule Take 120 mg by mouth daily.     Marland Kitchen HYDROcodone-acetaminophen (NORCO/VICODIN) 5-325 MG tablet Take 1 tablet by mouth 3 (three) times daily. 30 tablet 0  . Insulin Aspart (NOVOLOG FLEXPEN [place]) Inject 15-21 Units into the skin 3 (three) times daily before meals.    . Insulin Detemir (LEVEMIR FLEXTOUCH) 100 UNIT/ML Pen INJECT 50 UNITS SUBCUTANEOUSLY AT BEDTIME. 15 mL 2  . iron polysaccharides (NIFEREX) 150 MG capsule Take 1 capsule (150 mg total) by mouth daily. 30 capsule 11  . latanoprost (XALATAN) 0.005 % ophthalmic solution Place 1 drop into both eyes at bedtime.    Marland Kitchen loperamide (IMODIUM) 2 MG capsule Take 4 mg by mouth daily as needed for diarrhea or loose stools. Take 2 capsules initially then take 2 after each loose stool per MAR    .  metoCLOPramide (REGLAN) 5 MG tablet Take 1 tablet (5 mg total) by mouth every 8 (eight) hours as needed for nausea or vomiting (or bloating). 5 tablet 0  . metoprolol succinate (TOPROL-XL) 25 MG 24 hr tablet Take 12.5 mg by mouth 2 (two) times daily.     . mirtazapine (REMERON) 30 MG tablet Take 30 mg by mouth at bedtime.      . naloxegol oxalate (MOVANTIK) 12.5 MG TABS tablet Take 12.5 mg by mouth daily.    Marland Kitchen omeprazole (PRILOSEC) 40 MG capsule Take 40 mg by mouth daily.    . ondansetron (ZOFRAN) 4 MG tablet Take 1 tablet (4 mg total) by mouth 4 (four) times daily -  before meals and at bedtime. 120 tablet 3  . polyethylene glycol powder (GLYCOLAX/MIRALAX) powder Take 17 g by mouth daily as needed.    . polyvinyl alcohol (ARTIFICIAL TEARS) 1.4 % ophthalmic solution Place 1 drop into both eyes 3 (three) times daily.    . promethazine (PHENERGAN) 12.5 MG tablet TAKE 1 TABLET BY MOUTH EVERY 6 HOURS AS NEEDED FOR NAUSEA OR VOMITING. 30 tablet 0  . risperiDONE (RISPERDAL) 3 MG tablet Take 3 mg by  mouth 2 (two) times daily.     . simvastatin (ZOCOR) 20 MG tablet Take 20 mg by mouth at bedtime.     . solifenacin (VESICARE) 5 MG tablet Take 5 mg by mouth daily.    Marland Kitchen tiZANidine (ZANAFLEX) 2 MG tablet Take 2 mg by mouth daily.    . Vitamin D, Ergocalciferol, (DRISDOL) 50000 UNITS CAPS capsule Take 50,000 Units by mouth every Friday.     . warfarin (COUMADIN) 5 MG tablet Take 1 tablet daily except 1/2 tablet on Mondays, Wednesdays and Fridays 28 tablet 4  . zolpidem (AMBIEN) 10 MG tablet Take 1 tablet (10 mg total) by mouth at bedtime. 5 tablet 0   No current facility-administered medications for this visit.     Allergies as of 10/16/2016 - Review Complete 10/16/2016  Allergen Reaction Noted  . Sulfa antibiotics Rash 02/23/2010  . Sulfonamide derivatives Rash     Family History  Problem Relation Age of Onset  . Hypertension Mother   . Colon cancer Neg Hx     Social History   Social History    . Marital status: Widowed    Spouse name: N/A  . Number of children: N/A  . Years of education: N/A   Social History Main Topics  . Smoking status: Former Smoker    Packs/day: 0.25    Years: 20.00    Types: Cigarettes    Quit date: 01/24/1995  . Smokeless tobacco: Never Used  . Alcohol use No  . Drug use: No  . Sexual activity: No   Other Topics Concern  . None   Social History Narrative  . None    Review of Systems: Complete ROS negative except as per HPI.   Physical Exam: BP (!) 100/59   Pulse 81   Temp (!) 96.8 F (36 C) (Oral)   Ht 5\' 6"  (1.676 m)  General:   Alert. Pleasant and cooperative. Well-nourished and well-developed. In a wheelchair. Eyes:  Without icterus, sclera clear and conjunctiva pink.  Ears:  Normal auditory acuity. Cardiovascular:  S1, S2 present without murmurs appreciated. Extremities without clubbing or edema. Respiratory:  Clear to auscultation bilaterally. No wheezes, rales, or rhonchi. No distress.  Gastrointestinal:  +BS, rounded but soft, non-tender and non-distended. No HSM noted. No guarding or rebound. No masses appreciated.  Rectal:  Deferred  Musculoskalatal:  Symmetrical without gross deformities. Psych:  Alert and cooperative. Normal mood and affect. Heme/Lymph/Immune: No excessive bruising noted.    10/16/2016 11:52 AM   Disclaimer: This note was dictated with voice recognition software. Similar sounding words can inadvertently be transcribed and may not be corrected upon review.

## 2016-10-16 NOTE — Progress Notes (Signed)
CC'D TO PCP °

## 2016-10-16 NOTE — Patient Instructions (Signed)
1. Continue your current medications. 2. As we discussed, when you're constipated take Movantik. When you develop diarrhea, hold Movantik can start Imodium. When diarrhea stops and you resume constipation, hold Imodium and restart Movantik. 3. Return for follow-up in 6 months. 4. Call if you have any questions or concerns.

## 2016-10-16 NOTE — Telephone Encounter (Signed)
Butch Penny from [facility] call and she would like for you to call her at [phone]. She has some question about the instructions you gave the patient.

## 2016-10-17 DIAGNOSIS — I10 Essential (primary) hypertension: Secondary | ICD-10-CM | POA: Diagnosis not present

## 2016-10-17 DIAGNOSIS — E1165 Type 2 diabetes mellitus with hyperglycemia: Secondary | ICD-10-CM | POA: Diagnosis not present

## 2016-10-17 DIAGNOSIS — I825Y9 Chronic embolism and thrombosis of unspecified deep veins of unspecified proximal lower extremity: Secondary | ICD-10-CM | POA: Diagnosis not present

## 2016-10-19 DIAGNOSIS — I82502 Chronic embolism and thrombosis of unspecified deep veins of left lower extremity: Secondary | ICD-10-CM | POA: Diagnosis not present

## 2016-10-19 DIAGNOSIS — I10 Essential (primary) hypertension: Secondary | ICD-10-CM | POA: Diagnosis not present

## 2016-10-19 DIAGNOSIS — R2689 Other abnormalities of gait and mobility: Secondary | ICD-10-CM | POA: Diagnosis not present

## 2016-10-19 DIAGNOSIS — F333 Major depressive disorder, recurrent, severe with psychotic symptoms: Secondary | ICD-10-CM | POA: Diagnosis not present

## 2016-10-19 DIAGNOSIS — E119 Type 2 diabetes mellitus without complications: Secondary | ICD-10-CM | POA: Diagnosis not present

## 2016-10-19 DIAGNOSIS — I69354 Hemiplegia and hemiparesis following cerebral infarction affecting left non-dominant side: Secondary | ICD-10-CM | POA: Diagnosis not present

## 2016-10-24 DIAGNOSIS — I69354 Hemiplegia and hemiparesis following cerebral infarction affecting left non-dominant side: Secondary | ICD-10-CM | POA: Diagnosis not present

## 2016-10-24 DIAGNOSIS — R2689 Other abnormalities of gait and mobility: Secondary | ICD-10-CM | POA: Diagnosis not present

## 2016-10-24 DIAGNOSIS — F333 Major depressive disorder, recurrent, severe with psychotic symptoms: Secondary | ICD-10-CM | POA: Diagnosis not present

## 2016-10-24 DIAGNOSIS — I10 Essential (primary) hypertension: Secondary | ICD-10-CM | POA: Diagnosis not present

## 2016-10-24 DIAGNOSIS — E119 Type 2 diabetes mellitus without complications: Secondary | ICD-10-CM | POA: Diagnosis not present

## 2016-10-24 DIAGNOSIS — I82502 Chronic embolism and thrombosis of unspecified deep veins of left lower extremity: Secondary | ICD-10-CM | POA: Diagnosis not present

## 2016-10-25 ENCOUNTER — Ambulatory Visit (INDEPENDENT_AMBULATORY_CARE_PROVIDER_SITE_OTHER): Payer: Medicare Other | Admitting: *Deleted

## 2016-10-25 DIAGNOSIS — I82502 Chronic embolism and thrombosis of unspecified deep veins of left lower extremity: Secondary | ICD-10-CM | POA: Diagnosis not present

## 2016-10-25 DIAGNOSIS — F333 Major depressive disorder, recurrent, severe with psychotic symptoms: Secondary | ICD-10-CM | POA: Diagnosis not present

## 2016-10-25 DIAGNOSIS — E119 Type 2 diabetes mellitus without complications: Secondary | ICD-10-CM | POA: Diagnosis not present

## 2016-10-25 DIAGNOSIS — Z5181 Encounter for therapeutic drug level monitoring: Secondary | ICD-10-CM | POA: Diagnosis not present

## 2016-10-25 DIAGNOSIS — I10 Essential (primary) hypertension: Secondary | ICD-10-CM | POA: Diagnosis not present

## 2016-10-25 DIAGNOSIS — Z8679 Personal history of other diseases of the circulatory system: Secondary | ICD-10-CM

## 2016-10-25 DIAGNOSIS — Z7901 Long term (current) use of anticoagulants: Secondary | ICD-10-CM

## 2016-10-25 DIAGNOSIS — I82409 Acute embolism and thrombosis of unspecified deep veins of unspecified lower extremity: Secondary | ICD-10-CM

## 2016-10-25 DIAGNOSIS — R2689 Other abnormalities of gait and mobility: Secondary | ICD-10-CM | POA: Diagnosis not present

## 2016-10-25 DIAGNOSIS — I69354 Hemiplegia and hemiparesis following cerebral infarction affecting left non-dominant side: Secondary | ICD-10-CM | POA: Diagnosis not present

## 2016-10-25 LAB — POCT INR: INR: 3.5

## 2016-10-26 ENCOUNTER — Other Ambulatory Visit: Payer: Self-pay | Admitting: "Endocrinology

## 2016-10-26 NOTE — Telephone Encounter (Signed)
Called Butch Penny who said they can't go off what the patient tells them because she changes her story multiple times in 10-15 minutes. States she can go off observation instead: If SEE constipation take Movantik and if SEE diarrhea use Imodium.

## 2016-10-27 DIAGNOSIS — E119 Type 2 diabetes mellitus without complications: Secondary | ICD-10-CM | POA: Diagnosis not present

## 2016-10-27 DIAGNOSIS — R2689 Other abnormalities of gait and mobility: Secondary | ICD-10-CM | POA: Diagnosis not present

## 2016-10-27 DIAGNOSIS — I69354 Hemiplegia and hemiparesis following cerebral infarction affecting left non-dominant side: Secondary | ICD-10-CM | POA: Diagnosis not present

## 2016-10-27 DIAGNOSIS — F333 Major depressive disorder, recurrent, severe with psychotic symptoms: Secondary | ICD-10-CM | POA: Diagnosis not present

## 2016-10-27 DIAGNOSIS — I82502 Chronic embolism and thrombosis of unspecified deep veins of left lower extremity: Secondary | ICD-10-CM | POA: Diagnosis not present

## 2016-10-27 DIAGNOSIS — I10 Essential (primary) hypertension: Secondary | ICD-10-CM | POA: Diagnosis not present

## 2016-10-30 ENCOUNTER — Encounter (HOSPITAL_BASED_OUTPATIENT_CLINIC_OR_DEPARTMENT_OTHER): Payer: Medicare Other | Admitting: Oncology

## 2016-10-30 ENCOUNTER — Encounter (HOSPITAL_COMMUNITY): Payer: Self-pay | Admitting: Oncology

## 2016-10-30 ENCOUNTER — Encounter (HOSPITAL_COMMUNITY): Payer: Medicare Other | Attending: Oncology

## 2016-10-30 VITALS — BP 112/60 | HR 87 | Resp 16

## 2016-10-30 DIAGNOSIS — Z7901 Long term (current) use of anticoagulants: Secondary | ICD-10-CM | POA: Diagnosis not present

## 2016-10-30 DIAGNOSIS — I672 Cerebral atherosclerosis: Secondary | ICD-10-CM | POA: Insufficient documentation

## 2016-10-30 DIAGNOSIS — R531 Weakness: Secondary | ICD-10-CM | POA: Diagnosis not present

## 2016-10-30 DIAGNOSIS — Z8249 Family history of ischemic heart disease and other diseases of the circulatory system: Secondary | ICD-10-CM | POA: Diagnosis not present

## 2016-10-30 DIAGNOSIS — K5900 Constipation, unspecified: Secondary | ICD-10-CM | POA: Diagnosis not present

## 2016-10-30 DIAGNOSIS — F329 Major depressive disorder, single episode, unspecified: Secondary | ICD-10-CM | POA: Insufficient documentation

## 2016-10-30 DIAGNOSIS — K219 Gastro-esophageal reflux disease without esophagitis: Secondary | ICD-10-CM | POA: Insufficient documentation

## 2016-10-30 DIAGNOSIS — Z9889 Other specified postprocedural states: Secondary | ICD-10-CM | POA: Diagnosis not present

## 2016-10-30 DIAGNOSIS — D508 Other iron deficiency anemias: Secondary | ICD-10-CM

## 2016-10-30 DIAGNOSIS — F209 Schizophrenia, unspecified: Secondary | ICD-10-CM | POA: Insufficient documentation

## 2016-10-30 DIAGNOSIS — Z86718 Personal history of other venous thrombosis and embolism: Secondary | ICD-10-CM | POA: Insufficient documentation

## 2016-10-30 DIAGNOSIS — Z862 Personal history of diseases of the blood and blood-forming organs and certain disorders involving the immune mechanism: Secondary | ICD-10-CM | POA: Diagnosis not present

## 2016-10-30 DIAGNOSIS — Z915 Personal history of self-harm: Secondary | ICD-10-CM | POA: Insufficient documentation

## 2016-10-30 DIAGNOSIS — E119 Type 2 diabetes mellitus without complications: Secondary | ICD-10-CM | POA: Insufficient documentation

## 2016-10-30 DIAGNOSIS — Z8673 Personal history of transient ischemic attack (TIA), and cerebral infarction without residual deficits: Secondary | ICD-10-CM | POA: Diagnosis not present

## 2016-10-30 DIAGNOSIS — F419 Anxiety disorder, unspecified: Secondary | ICD-10-CM | POA: Diagnosis not present

## 2016-10-30 DIAGNOSIS — Z87891 Personal history of nicotine dependence: Secondary | ICD-10-CM | POA: Diagnosis not present

## 2016-10-30 DIAGNOSIS — D500 Iron deficiency anemia secondary to blood loss (chronic): Secondary | ICD-10-CM | POA: Insufficient documentation

## 2016-10-30 DIAGNOSIS — I6523 Occlusion and stenosis of bilateral carotid arteries: Secondary | ICD-10-CM | POA: Diagnosis not present

## 2016-10-30 LAB — BASIC METABOLIC PANEL
Anion gap: 8 (ref 5–15)
BUN: 10 mg/dL (ref 6–20)
CO2: 28 mmol/L (ref 22–32)
Calcium: 9.1 mg/dL (ref 8.9–10.3)
Chloride: 100 mmol/L — ABNORMAL LOW (ref 101–111)
Creatinine, Ser: 0.79 mg/dL (ref 0.44–1.00)
GFR calc Af Amer: >60 mL/min (ref >60)
GFR calc non Af Amer: >60 mL/min (ref >60)
Glucose, Bld: 158 mg/dL — ABNORMAL HIGH (ref 65–99)
Potassium: 3.9 mmol/L (ref 3.5–5.1)
Sodium: 136 mmol/L (ref 135–145)

## 2016-10-30 LAB — CBC WITH DIFFERENTIAL/PLATELET
Basophils Absolute: 0 10*3/uL (ref 0.0–0.1)
Basophils Relative: 0 %
Eosinophils Absolute: 0.2 10*3/uL (ref 0.0–0.7)
Eosinophils Relative: 3 %
HCT: 37.6 % (ref 36.0–46.0)
Hemoglobin: 12.7 g/dL (ref 12.0–15.0)
Lymphocytes Relative: 30 %
Lymphs Abs: 2 10*3/uL (ref 0.7–4.0)
MCH: 30.4 pg (ref 26.0–34.0)
MCHC: 33.8 g/dL (ref 30.0–36.0)
MCV: 90 fL (ref 78.0–100.0)
Monocytes Absolute: 0.5 10*3/uL (ref 0.1–1.0)
Monocytes Relative: 7 %
Neutro Abs: 3.9 10*3/uL (ref 1.7–7.7)
Neutrophils Relative %: 60 %
Platelets: 245 10*3/uL (ref 150–400)
RBC: 4.18 MIL/uL (ref 3.87–5.11)
RDW: 13 % (ref 11.5–15.5)
WBC: 6.6 10*3/uL (ref 4.0–10.5)

## 2016-10-30 LAB — IRON AND TIBC
Iron: 71 ug/dL (ref 28–170)
Saturation Ratios: 28 % (ref 10.4–31.8)
TIBC: 258 ug/dL (ref 250–450)
UIBC: 187 ug/dL

## 2016-10-30 LAB — FERRITIN: Ferritin: 119 ng/mL (ref 11–307)

## 2016-10-30 NOTE — Progress Notes (Signed)
[name], [place] [id number]  No diagnosis found.   INTERVAL HISTORY: [name] 71 y.o. female returns for followup of iron deficiency anemia in the setting of chronic anticoagulation with Coumadin for recurrent DVT/CVA.   Today patient states that overall she is doing well. She has been experiencing constipation for the past one week. She denies any recent bleeding including hematuria, melena, hematochezia. She denies any chest pain, shortness breath, abdominal pain, focal weakness.  Review of Systems  Constitutional: Negative.  Negative for chills, fever and weight loss.  HENT: Negative.  Negative for nosebleeds.   Eyes: Negative.  Negative for double vision.  Respiratory: Negative.  Negative for cough.   Cardiovascular: Negative.  Negative for chest pain.  Gastrointestinal: Positive for constipation. Negative for abdominal pain, blood in stool, diarrhea, melena, nausea and vomiting.  Genitourinary: Negative.  Negative for hematuria.  Musculoskeletal: Negative.   Skin: Negative.   Neurological: Negative.  Negative for weakness.  Endo/Heme/Allergies: Negative.   Psychiatric/Behavioral: Negative.     Past Medical History:  Diagnosis Date  . Anxiety   . Arthritis   . Depression    History of psychosis and previous suicide attempt  . DVT, lower extremity, recurrent (HCC)    Long-term Coumadin per Dr. Legrand Rams  . Essential hypertension   . GERD (gastroesophageal reflux disease)   . Hemiplegia ([place]) 2010   Left side  . History of stroke    Acute infarct and right cerebral white matter small vessel disease 12/10  . Leg DVT (deep venous thromboembolism), acute ([place]) 2006  . Schizophrenia ([place])   . Stroke [facility])    left sided weakness  . Type 2 diabetes mellitus ([place])     Past Surgical History:  Procedure Laterality Date  . BACK SURGERY    . BIOPSY N/A 11/24/2014   Procedure: BIOPSY;  Surgeon: Danie Binder, MD;  Location:  [facility];  Service: Endoscopy;  Laterality: N/A;  . BIOPSY  05/17/2016   Procedure: BIOPSY;  Surgeon: Danie Binder, MD;  Location: [facility];  Service: Endoscopy;;  gastric biopsy  . COLONOSCOPY WITH PROPOFOL N/A 11/24/2014   Dr. Rudie Meyer polyps removed/moderate sized internal hemorrhoids, tubular adenomas. Next surveillance in 3 years  . ESOPHAGOGASTRODUODENOSCOPY (EGD) WITH PROPOFOL N/A 11/24/2014   Dr. Clayburn Pert HH/patent stricture at the gastroesophageal junction, mild non-erosive gastritis, path negative for H.pylori or celiac sprue  . ESOPHAGOGASTRODUODENOSCOPY (EGD) WITH PROPOFOL N/A 05/17/2016   Procedure: ESOPHAGOGASTRODUODENOSCOPY (EGD) WITH PROPOFOL;  Surgeon: Danie Binder, MD;  Location: [facility];  Service: Endoscopy;  Laterality: N/A;  12:45pm  . GIVENS CAPSULE STUDY N/A 12/11/2014   MULTILPLE EROSION IN the stomach WITH ACTIVE OOZING. OCCASIONAL EROSIONS AND RARE ULCER SEEN IN PROXIMAL SMALL BOWEL . No masses or AVMs SEEN. NO OLD BLOOD OR FRESH BLOOD SEEN.   . POLYPECTOMY N/A 11/24/2014   Procedure: POLYPECTOMY;  Surgeon: Danie Binder, MD;  Location: [facility];  Service: Endoscopy;  Laterality: N/A;  . SAVORY DILATION N/A 05/17/2016   Procedure: SAVORY DILATION;  Surgeon: Danie Binder, MD;  Location: [facility];  Service: Endoscopy;  Laterality: N/A;    Family History  Problem Relation Age of Onset  . Hypertension Mother   . Colon cancer Neg Hx     Social History   Social History  . Marital status: Widowed    Spouse name: N/A  . Number of children: N/A  . Years  of education: N/A   Social History Main Topics  . Smoking status: Former Smoker    Packs/day: 0.25    Years: 20.00    Types: Cigarettes    Quit date: 01/24/1995  . Smokeless tobacco: Never Used  . Alcohol use No  . Drug use: No  . Sexual activity: No   Other Topics Concern  . None   Social History Narrative  . None     PHYSICAL EXAMINATION  ECOG PERFORMANCE STATUS: 2 - Symptomatic,  <50% confined to bed  Vitals:   10/30/16 1119  BP: 112/60  Pulse: 87  Resp: 16  SpO2: 96%   Constitutional: Well-developed, well-nourished, and in no distress.   HENT:  Head: Normocephalic and atraumatic.  Mouth/Throat: No oropharyngeal exudate. Mucosa moist. Eyes: Pupils are equal, round, and reactive to light. Conjunctivae are normal. No scleral icterus.  Neck: Normal range of motion. Neck supple. No JVD present.  Cardiovascular: Normal rate, regular rhythm and normal heart sounds.  Exam reveals no gallop and no friction rub.   No murmur heard. Pulmonary/Chest: Effort normal and breath sounds normal. No respiratory distress. No wheezes.No rales.  Abdominal: Soft. Bowel sounds are normal. No distension. There is no tenderness. There is no guarding.  Musculoskeletal: No edema or tenderness.  Lymphadenopathy:    No cervical or supraclavicular adenopathy.  Neurological: Alert and oriented to person, place, and time. No cranial nerve deficit.  Skin: Skin is warm and dry. No rash noted. No erythema. No pallor.  Psychiatric: Affect and judgment normal.   LABORATORY DATA: CBC    Component Value Date/Time   WBC 6.6 10/30/2016 1008   RBC 4.18 10/30/2016 1008   HGB 12.7 10/30/2016 1008   HCT 37.6 10/30/2016 1008   PLT 245 10/30/2016 1008   MCV 90.0 10/30/2016 1008   MCH 30.4 10/30/2016 1008   MCHC 33.8 10/30/2016 1008   RDW 13.0 10/30/2016 1008   LYMPHSABS 2.0 10/30/2016 1008   MONOABS 0.5 10/30/2016 1008   EOSABS 0.2 10/30/2016 1008   BASOSABS 0.0 10/30/2016 1008      Chemistry      Component Value Date/Time   NA 136 10/30/2016 1008   NA 141 09/06/2016 0823   K 3.9 10/30/2016 1008   CL 100 (L) 10/30/2016 1008   CO2 28 10/30/2016 1008   BUN 10 10/30/2016 1008   BUN 10 09/06/2016 0823   CREATININE 0.79 10/30/2016 1008   CREATININE 0.76 11/17/2015 0803      Component Value Date/Time   CALCIUM 9.1 10/30/2016 1008   ALKPHOS 80 10/06/2016 0425   AST 18 10/06/2016 0425     ALT 13 (L) 10/06/2016 0425   BILITOT 0.3 10/06/2016 0425   BILITOT 0.3 09/06/2016 0823     Lab Results  Component Value Date   IRON 63 04/05/2016   TIBC 304 04/05/2016   FERRITIN 104 04/05/2016     PENDING LABS:   RADIOGRAPHIC STUDIES:  Dg Chest 1 View  Result Date: 10/06/2016 CLINICAL DATA:  Fall this morning with left hip pain.  Weakness. EXAM: CHEST 1 VIEW COMPARISON:  Radiograph 02/13/2016 FINDINGS: Stable cardiomegaly. Unchanged mediastinal contours. No pulmonary edema. No consolidation, pleural effusion, or pneumothorax. No acute osseous abnormalities are seen. IMPRESSION: No acute abnormality. Electronically Signed   By: Jeb Levering M.D.   On: 10/06/2016 05:23   Ct Head Wo Contrast  Result Date: 10/06/2016 CLINICAL DATA:  Altered mental status after fall this morning. EXAM: CT HEAD WITHOUT CONTRAST TECHNIQUE: Contiguous axial images  were obtained from the base of the skull through the vertex without intravenous contrast. COMPARISON:  Head CT 02/13/2016, brain MRI 05/23/2016 FINDINGS: Brain: Generalized atrophy. Stable degree of chronic small vessel ischemia from prior exam. Remote lacunar infarct in the left thalamus again seen. No intracranial hemorrhage, mass effect, or midline shift. No hydrocephalus. The basilar cisterns are patent. No evidence of territorial infarct or acute ischemia. No extra-axial or intracranial fluid collection. Vascular: Atherosclerosis of skullbase vasculature without hyperdense vessel or abnormal calcification. Skull: No skull fracture.  No focal lesion. Sinuses/Orbits: Chronic opacification of right mastoid air cells. Postsurgical change in the paranasal sinuses. No acute finding. Other: None. IMPRESSION: 1. No acute intracranial abnormality.  No skull fracture. 2. Stable atrophy and chronic small vessel ischemia. Electronically Signed   By: Jeb Levering M.D.   On: 10/06/2016 05:20   Mr Brain Wo Contrast  Result Date: 10/06/2016 CLINICAL  DATA:  Possible stroke. Pain in the left hip and leg with increased weakness. EXAM: MRI HEAD WITHOUT CONTRAST MRA HEAD WITHOUT CONTRAST TECHNIQUE: Multiplanar, multiecho pulse sequences of the brain and surrounding structures were obtained without intravenous contrast. Angiographic images of the head were obtained using MRA technique without contrast. COMPARISON:  05/23/2016 FINDINGS: MRI HEAD FINDINGS Brain: No acute infarction, hemorrhage, hydrocephalus, extra-axial collection or mass lesion. Advanced chronic small vessel ischemia with confluent gliosis in the deep white matter and remote lacunar infarcts in the left centrum semiovale and right periatrial white matter. Gliotic signal extends into the temporal lobes, without cystic change. Remote micro hemorrhages in the left thalamus and left caudate, hypertensive locations. A least 1 lobar microhemorrhage in the right occipital region. Vascular: Arterial findings below. Normal dural venous sinus flow voids. Skull and upper cervical spine: Negative for marrow lesion. Hyperostosis interna. Sinuses/Orbits: Changes of endoscopic sinus surgery. No active inflammation in the paranasal sinuses. Chronic right mastoid opacification with negative nasopharynx. MRA HEAD FINDINGS Symmetric carotid arteries. Mild right vertebral artery dominance. There is a conical outpouching at the ophthalmic segment right ICA measuring 2 mm, shape suggesting infundibulum. Mild to moderate atherosclerotic irregularity of bilateral MCA branches. No proximal, correctable stenosis in the anterior circulation. Both vertebral arteries and the basilar are smooth and widely patent. There is a hypoplastic right P1 segment. Bilateral prominent P2 segment stenoses. Poor downstream PCA opacification is likely accentuated by vessel direction. IMPRESSION: Brain MRI: 1. No acute finding. 2. Advanced chronic small vessel ischemic in the cerebral white matter. Intracranial MRA: 1. No acute finding. No flow  limiting stenosis in the major intracranial vessels. 2. Intracranial atherosclerosis with advanced stenoses in the bilateral PCA distribution. 3. 2 mm outpouching from the right ophthalmic segment ICA, shape suggesting infundibulum. Electronically Signed   By: Monte Fantasia M.D.   On: 10/06/2016 07:14   US Carotid Bilateral (at [facility] Only)  Result Date: 10/06/2016 CLINICAL DATA:  Left hemi paresis.  Altered mental status. EXAM: BILATERAL CAROTID DUPLEX ULTRASOUND TECHNIQUE: Pearline Cables scale imaging, color Doppler and duplex ultrasound were performed of bilateral carotid and vertebral arteries in the neck. COMPARISON:  None. FINDINGS: Criteria: Quantification of carotid stenosis is based on velocity parameters that correlate the residual internal carotid diameter with NASCET-based stenosis levels, using the diameter of the distal internal carotid lumen as the denominator for stenosis measurement. The following velocity measurements were obtained: RIGHT ICA:  102 cm/sec CCA:  77 cm/sec SYSTOLIC ICA/CCA RATIO:  1.3 DIASTOLIC ICA/CCA RATIO:  2.3 ECA:  79 cm/sec LEFT ICA:  75 cm/sec CCA:  82 cm/sec SYSTOLIC ICA/CCA RATIO:  0.9 DIASTOLIC ICA/CCA RATIO:  0.6 ECA:  102 cm/sec RIGHT CAROTID ARTERY: Mild calcified plaque in the bulb. Low resistance internal carotid Doppler pattern. RIGHT VERTEBRAL ARTERY:  Antegrade. LEFT CAROTID ARTERY: Mild focal calcified plaque in the bulb. Low resistance internal carotid Doppler pattern is preserved. LEFT VERTEBRAL ARTERY:  Antegrade. IMPRESSION: Less than 50% stenosis in the right and left internal carotid arteries. Electronically Signed   By: Marybelle Killings M.D.   On: 10/06/2016 07:47   Mr Jodene Nam Head/brain ZE Cm  Result Date: 10/06/2016 CLINICAL DATA:  Possible stroke. Pain in the left hip and leg with increased weakness. EXAM: MRI HEAD WITHOUT CONTRAST MRA HEAD WITHOUT CONTRAST TECHNIQUE: Multiplanar, multiecho pulse sequences of the brain and surrounding structures were  obtained without intravenous contrast. Angiographic images of the head were obtained using MRA technique without contrast. COMPARISON:  05/23/2016 FINDINGS: MRI HEAD FINDINGS Brain: No acute infarction, hemorrhage, hydrocephalus, extra-axial collection or mass lesion. Advanced chronic small vessel ischemia with confluent gliosis in the deep white matter and remote lacunar infarcts in the left centrum semiovale and right periatrial white matter. Gliotic signal extends into the temporal lobes, without cystic change. Remote micro hemorrhages in the left thalamus and left caudate, hypertensive locations. A least 1 lobar microhemorrhage in the right occipital region. Vascular: Arterial findings below. Normal dural venous sinus flow voids. Skull and upper cervical spine: Negative for marrow lesion. Hyperostosis interna. Sinuses/Orbits: Changes of endoscopic sinus surgery. No active inflammation in the paranasal sinuses. Chronic right mastoid opacification with negative nasopharynx. MRA HEAD FINDINGS Symmetric carotid arteries. Mild right vertebral artery dominance. There is a conical outpouching at the ophthalmic segment right ICA measuring 2 mm, shape suggesting infundibulum. Mild to moderate atherosclerotic irregularity of bilateral MCA branches. No proximal, correctable stenosis in the anterior circulation. Both vertebral arteries and the basilar are smooth and widely patent. There is a hypoplastic right P1 segment. Bilateral prominent P2 segment stenoses. Poor downstream PCA opacification is likely accentuated by vessel direction. IMPRESSION: Brain MRI: 1. No acute finding. 2. Advanced chronic small vessel ischemic in the cerebral white matter. Intracranial MRA: 1. No acute finding. No flow limiting stenosis in the major intracranial vessels. 2. Intracranial atherosclerosis with advanced stenoses in the bilateral PCA distribution. 3. 2 mm outpouching from the right ophthalmic segment ICA, shape suggesting  infundibulum. Electronically Signed   By: Monte Fantasia M.D.   On: 10/06/2016 07:14   Dg Hip Unilat W Or Wo Pelvis 2-3 Views Left  Result Date: 10/06/2016 CLINICAL DATA:  Fall with morning with left hip pain. EXAM: DG HIP (WITH OR WITHOUT PELVIS) 2-3V LEFT COMPARISON:  None. FINDINGS: The cortical margins of the bony pelvis and left hip are intact. No fracture. Pubic symphysis and sacroiliac joints are congruent. Both femoral heads are well-seated in the respective acetabula. Age related degenerative change of both hips. IMPRESSION: No fracture of the pelvis or left hip. Electronically Signed   By: Jeb Levering M.D.   On: 10/06/2016 05:23     PATHOLOGY:    ASSESSMENT AND PLAN:  Iron deficiency anemia in the setting of chronic anticoagulation with coumadin  PLAN: -Patient's hemoglobin is 12.8 g/dL today. She has not been anemic in over a year now. -Continue niferex 150mg  PO daily. -Since she has not been anemic for over a year, I will discharge her from clinic and turn her back over to her PCP for routine follow up and labwork. If she should become anemic in the  future, please have patient come see [place] again in the future.   This note is electronically signed by: Twana First, MD 10/30/2016 11:20 AM

## 2016-10-31 DIAGNOSIS — I82502 Chronic embolism and thrombosis of unspecified deep veins of left lower extremity: Secondary | ICD-10-CM | POA: Diagnosis not present

## 2016-10-31 DIAGNOSIS — I10 Essential (primary) hypertension: Secondary | ICD-10-CM | POA: Diagnosis not present

## 2016-10-31 DIAGNOSIS — E119 Type 2 diabetes mellitus without complications: Secondary | ICD-10-CM | POA: Diagnosis not present

## 2016-10-31 DIAGNOSIS — I69354 Hemiplegia and hemiparesis following cerebral infarction affecting left non-dominant side: Secondary | ICD-10-CM | POA: Diagnosis not present

## 2016-10-31 DIAGNOSIS — F333 Major depressive disorder, recurrent, severe with psychotic symptoms: Secondary | ICD-10-CM | POA: Diagnosis not present

## 2016-10-31 DIAGNOSIS — R2689 Other abnormalities of gait and mobility: Secondary | ICD-10-CM | POA: Diagnosis not present

## 2016-11-01 DIAGNOSIS — F333 Major depressive disorder, recurrent, severe with psychotic symptoms: Secondary | ICD-10-CM | POA: Diagnosis not present

## 2016-11-01 DIAGNOSIS — R2689 Other abnormalities of gait and mobility: Secondary | ICD-10-CM | POA: Diagnosis not present

## 2016-11-01 DIAGNOSIS — I69354 Hemiplegia and hemiparesis following cerebral infarction affecting left non-dominant side: Secondary | ICD-10-CM | POA: Diagnosis not present

## 2016-11-01 DIAGNOSIS — E119 Type 2 diabetes mellitus without complications: Secondary | ICD-10-CM | POA: Diagnosis not present

## 2016-11-01 DIAGNOSIS — I82502 Chronic embolism and thrombosis of unspecified deep veins of left lower extremity: Secondary | ICD-10-CM | POA: Diagnosis not present

## 2016-11-01 DIAGNOSIS — I10 Essential (primary) hypertension: Secondary | ICD-10-CM | POA: Diagnosis not present

## 2016-11-02 ENCOUNTER — Other Ambulatory Visit: Payer: Self-pay | Admitting: "Endocrinology

## 2016-11-03 DIAGNOSIS — I10 Essential (primary) hypertension: Secondary | ICD-10-CM | POA: Diagnosis not present

## 2016-11-03 DIAGNOSIS — R2689 Other abnormalities of gait and mobility: Secondary | ICD-10-CM | POA: Diagnosis not present

## 2016-11-03 DIAGNOSIS — F333 Major depressive disorder, recurrent, severe with psychotic symptoms: Secondary | ICD-10-CM | POA: Diagnosis not present

## 2016-11-03 DIAGNOSIS — I82502 Chronic embolism and thrombosis of unspecified deep veins of left lower extremity: Secondary | ICD-10-CM | POA: Diagnosis not present

## 2016-11-03 DIAGNOSIS — E119 Type 2 diabetes mellitus without complications: Secondary | ICD-10-CM | POA: Diagnosis not present

## 2016-11-03 DIAGNOSIS — I69354 Hemiplegia and hemiparesis following cerebral infarction affecting left non-dominant side: Secondary | ICD-10-CM | POA: Diagnosis not present

## 2016-11-04 DIAGNOSIS — I82502 Chronic embolism and thrombosis of unspecified deep veins of left lower extremity: Secondary | ICD-10-CM | POA: Diagnosis not present

## 2016-11-04 DIAGNOSIS — I69354 Hemiplegia and hemiparesis following cerebral infarction affecting left non-dominant side: Secondary | ICD-10-CM | POA: Diagnosis not present

## 2016-11-04 DIAGNOSIS — R2689 Other abnormalities of gait and mobility: Secondary | ICD-10-CM | POA: Diagnosis not present

## 2016-11-04 DIAGNOSIS — I10 Essential (primary) hypertension: Secondary | ICD-10-CM | POA: Diagnosis not present

## 2016-11-04 DIAGNOSIS — E119 Type 2 diabetes mellitus without complications: Secondary | ICD-10-CM | POA: Diagnosis not present

## 2016-11-04 DIAGNOSIS — F333 Major depressive disorder, recurrent, severe with psychotic symptoms: Secondary | ICD-10-CM | POA: Diagnosis not present

## 2016-11-07 DIAGNOSIS — I10 Essential (primary) hypertension: Secondary | ICD-10-CM | POA: Diagnosis not present

## 2016-11-07 DIAGNOSIS — E119 Type 2 diabetes mellitus without complications: Secondary | ICD-10-CM | POA: Diagnosis not present

## 2016-11-07 DIAGNOSIS — F333 Major depressive disorder, recurrent, severe with psychotic symptoms: Secondary | ICD-10-CM | POA: Diagnosis not present

## 2016-11-07 DIAGNOSIS — I82502 Chronic embolism and thrombosis of unspecified deep veins of left lower extremity: Secondary | ICD-10-CM | POA: Diagnosis not present

## 2016-11-07 DIAGNOSIS — I69354 Hemiplegia and hemiparesis following cerebral infarction affecting left non-dominant side: Secondary | ICD-10-CM | POA: Diagnosis not present

## 2016-11-07 DIAGNOSIS — R2689 Other abnormalities of gait and mobility: Secondary | ICD-10-CM | POA: Diagnosis not present

## 2016-11-08 DIAGNOSIS — I10 Essential (primary) hypertension: Secondary | ICD-10-CM | POA: Diagnosis not present

## 2016-11-08 DIAGNOSIS — I82502 Chronic embolism and thrombosis of unspecified deep veins of left lower extremity: Secondary | ICD-10-CM | POA: Diagnosis not present

## 2016-11-08 DIAGNOSIS — F333 Major depressive disorder, recurrent, severe with psychotic symptoms: Secondary | ICD-10-CM | POA: Diagnosis not present

## 2016-11-08 DIAGNOSIS — I69354 Hemiplegia and hemiparesis following cerebral infarction affecting left non-dominant side: Secondary | ICD-10-CM | POA: Diagnosis not present

## 2016-11-08 DIAGNOSIS — R2689 Other abnormalities of gait and mobility: Secondary | ICD-10-CM | POA: Diagnosis not present

## 2016-11-08 DIAGNOSIS — E119 Type 2 diabetes mellitus without complications: Secondary | ICD-10-CM | POA: Diagnosis not present

## 2016-11-09 DIAGNOSIS — E1165 Type 2 diabetes mellitus with hyperglycemia: Secondary | ICD-10-CM | POA: Diagnosis not present

## 2016-11-09 DIAGNOSIS — Z23 Encounter for immunization: Secondary | ICD-10-CM | POA: Diagnosis not present

## 2016-11-09 DIAGNOSIS — I825Y9 Chronic embolism and thrombosis of unspecified deep veins of unspecified proximal lower extremity: Secondary | ICD-10-CM | POA: Diagnosis not present

## 2016-11-09 DIAGNOSIS — F333 Major depressive disorder, recurrent, severe with psychotic symptoms: Secondary | ICD-10-CM | POA: Diagnosis not present

## 2016-11-09 DIAGNOSIS — G8190 Hemiplegia, unspecified affecting unspecified side: Secondary | ICD-10-CM | POA: Diagnosis not present

## 2016-11-10 DIAGNOSIS — I82502 Chronic embolism and thrombosis of unspecified deep veins of left lower extremity: Secondary | ICD-10-CM | POA: Diagnosis not present

## 2016-11-10 DIAGNOSIS — I10 Essential (primary) hypertension: Secondary | ICD-10-CM | POA: Diagnosis not present

## 2016-11-10 DIAGNOSIS — E119 Type 2 diabetes mellitus without complications: Secondary | ICD-10-CM | POA: Diagnosis not present

## 2016-11-10 DIAGNOSIS — R2689 Other abnormalities of gait and mobility: Secondary | ICD-10-CM | POA: Diagnosis not present

## 2016-11-10 DIAGNOSIS — F333 Major depressive disorder, recurrent, severe with psychotic symptoms: Secondary | ICD-10-CM | POA: Diagnosis not present

## 2016-11-10 DIAGNOSIS — I69354 Hemiplegia and hemiparesis following cerebral infarction affecting left non-dominant side: Secondary | ICD-10-CM | POA: Diagnosis not present

## 2016-11-13 DIAGNOSIS — F333 Major depressive disorder, recurrent, severe with psychotic symptoms: Secondary | ICD-10-CM | POA: Diagnosis not present

## 2016-11-13 DIAGNOSIS — R2689 Other abnormalities of gait and mobility: Secondary | ICD-10-CM | POA: Diagnosis not present

## 2016-11-13 DIAGNOSIS — E119 Type 2 diabetes mellitus without complications: Secondary | ICD-10-CM | POA: Diagnosis not present

## 2016-11-13 DIAGNOSIS — I69354 Hemiplegia and hemiparesis following cerebral infarction affecting left non-dominant side: Secondary | ICD-10-CM | POA: Diagnosis not present

## 2016-11-13 DIAGNOSIS — I82502 Chronic embolism and thrombosis of unspecified deep veins of left lower extremity: Secondary | ICD-10-CM | POA: Diagnosis not present

## 2016-11-13 DIAGNOSIS — I10 Essential (primary) hypertension: Secondary | ICD-10-CM | POA: Diagnosis not present

## 2016-11-15 ENCOUNTER — Ambulatory Visit (INDEPENDENT_AMBULATORY_CARE_PROVIDER_SITE_OTHER): Payer: Medicare Other | Admitting: *Deleted

## 2016-11-15 DIAGNOSIS — Z7901 Long term (current) use of anticoagulants: Secondary | ICD-10-CM | POA: Diagnosis not present

## 2016-11-15 DIAGNOSIS — Z5181 Encounter for therapeutic drug level monitoring: Secondary | ICD-10-CM

## 2016-11-15 DIAGNOSIS — Z8679 Personal history of other diseases of the circulatory system: Secondary | ICD-10-CM

## 2016-11-15 DIAGNOSIS — I82409 Acute embolism and thrombosis of unspecified deep veins of unspecified lower extremity: Secondary | ICD-10-CM | POA: Diagnosis not present

## 2016-11-15 DIAGNOSIS — G8190 Hemiplegia, unspecified affecting unspecified side: Secondary | ICD-10-CM | POA: Diagnosis not present

## 2016-11-15 LAB — POCT INR: INR: 5

## 2016-11-16 DIAGNOSIS — E119 Type 2 diabetes mellitus without complications: Secondary | ICD-10-CM | POA: Diagnosis not present

## 2016-11-16 DIAGNOSIS — I69354 Hemiplegia and hemiparesis following cerebral infarction affecting left non-dominant side: Secondary | ICD-10-CM | POA: Diagnosis not present

## 2016-11-16 DIAGNOSIS — R2689 Other abnormalities of gait and mobility: Secondary | ICD-10-CM | POA: Diagnosis not present

## 2016-11-16 DIAGNOSIS — I82502 Chronic embolism and thrombosis of unspecified deep veins of left lower extremity: Secondary | ICD-10-CM | POA: Diagnosis not present

## 2016-11-16 DIAGNOSIS — I10 Essential (primary) hypertension: Secondary | ICD-10-CM | POA: Diagnosis not present

## 2016-11-16 DIAGNOSIS — F333 Major depressive disorder, recurrent, severe with psychotic symptoms: Secondary | ICD-10-CM | POA: Diagnosis not present

## 2016-11-20 ENCOUNTER — Encounter: Payer: Self-pay | Admitting: *Deleted

## 2016-11-20 DIAGNOSIS — F333 Major depressive disorder, recurrent, severe with psychotic symptoms: Secondary | ICD-10-CM | POA: Diagnosis not present

## 2016-11-20 DIAGNOSIS — I82502 Chronic embolism and thrombosis of unspecified deep veins of left lower extremity: Secondary | ICD-10-CM | POA: Diagnosis not present

## 2016-11-20 DIAGNOSIS — I69354 Hemiplegia and hemiparesis following cerebral infarction affecting left non-dominant side: Secondary | ICD-10-CM | POA: Diagnosis not present

## 2016-11-20 DIAGNOSIS — I10 Essential (primary) hypertension: Secondary | ICD-10-CM | POA: Diagnosis not present

## 2016-11-20 DIAGNOSIS — E119 Type 2 diabetes mellitus without complications: Secondary | ICD-10-CM | POA: Diagnosis not present

## 2016-11-20 DIAGNOSIS — R2689 Other abnormalities of gait and mobility: Secondary | ICD-10-CM | POA: Diagnosis not present

## 2016-11-21 DIAGNOSIS — F333 Major depressive disorder, recurrent, severe with psychotic symptoms: Secondary | ICD-10-CM | POA: Diagnosis not present

## 2016-11-21 DIAGNOSIS — I10 Essential (primary) hypertension: Secondary | ICD-10-CM | POA: Diagnosis not present

## 2016-11-21 DIAGNOSIS — I69354 Hemiplegia and hemiparesis following cerebral infarction affecting left non-dominant side: Secondary | ICD-10-CM | POA: Diagnosis not present

## 2016-11-21 DIAGNOSIS — I82502 Chronic embolism and thrombosis of unspecified deep veins of left lower extremity: Secondary | ICD-10-CM | POA: Diagnosis not present

## 2016-11-21 DIAGNOSIS — R2689 Other abnormalities of gait and mobility: Secondary | ICD-10-CM | POA: Diagnosis not present

## 2016-11-21 DIAGNOSIS — E119 Type 2 diabetes mellitus without complications: Secondary | ICD-10-CM | POA: Diagnosis not present

## 2016-11-23 DIAGNOSIS — E119 Type 2 diabetes mellitus without complications: Secondary | ICD-10-CM | POA: Diagnosis not present

## 2016-11-23 DIAGNOSIS — F333 Major depressive disorder, recurrent, severe with psychotic symptoms: Secondary | ICD-10-CM | POA: Diagnosis not present

## 2016-11-23 DIAGNOSIS — I82502 Chronic embolism and thrombosis of unspecified deep veins of left lower extremity: Secondary | ICD-10-CM | POA: Diagnosis not present

## 2016-11-23 DIAGNOSIS — I10 Essential (primary) hypertension: Secondary | ICD-10-CM | POA: Diagnosis not present

## 2016-11-23 DIAGNOSIS — I69354 Hemiplegia and hemiparesis following cerebral infarction affecting left non-dominant side: Secondary | ICD-10-CM | POA: Diagnosis not present

## 2016-11-23 DIAGNOSIS — R2689 Other abnormalities of gait and mobility: Secondary | ICD-10-CM | POA: Diagnosis not present

## 2016-11-24 DIAGNOSIS — R2689 Other abnormalities of gait and mobility: Secondary | ICD-10-CM | POA: Diagnosis not present

## 2016-11-24 DIAGNOSIS — I69354 Hemiplegia and hemiparesis following cerebral infarction affecting left non-dominant side: Secondary | ICD-10-CM | POA: Diagnosis not present

## 2016-11-24 DIAGNOSIS — E119 Type 2 diabetes mellitus without complications: Secondary | ICD-10-CM | POA: Diagnosis not present

## 2016-11-24 DIAGNOSIS — I82502 Chronic embolism and thrombosis of unspecified deep veins of left lower extremity: Secondary | ICD-10-CM | POA: Diagnosis not present

## 2016-11-24 DIAGNOSIS — F333 Major depressive disorder, recurrent, severe with psychotic symptoms: Secondary | ICD-10-CM | POA: Diagnosis not present

## 2016-11-24 DIAGNOSIS — I10 Essential (primary) hypertension: Secondary | ICD-10-CM | POA: Diagnosis not present

## 2016-11-27 DIAGNOSIS — F333 Major depressive disorder, recurrent, severe with psychotic symptoms: Secondary | ICD-10-CM | POA: Diagnosis not present

## 2016-11-27 DIAGNOSIS — I69354 Hemiplegia and hemiparesis following cerebral infarction affecting left non-dominant side: Secondary | ICD-10-CM | POA: Diagnosis not present

## 2016-11-27 DIAGNOSIS — I82502 Chronic embolism and thrombosis of unspecified deep veins of left lower extremity: Secondary | ICD-10-CM | POA: Diagnosis not present

## 2016-11-27 DIAGNOSIS — R2689 Other abnormalities of gait and mobility: Secondary | ICD-10-CM | POA: Diagnosis not present

## 2016-11-27 DIAGNOSIS — E119 Type 2 diabetes mellitus without complications: Secondary | ICD-10-CM | POA: Diagnosis not present

## 2016-11-27 DIAGNOSIS — I10 Essential (primary) hypertension: Secondary | ICD-10-CM | POA: Diagnosis not present

## 2016-11-29 DIAGNOSIS — I10 Essential (primary) hypertension: Secondary | ICD-10-CM | POA: Diagnosis not present

## 2016-11-29 DIAGNOSIS — F333 Major depressive disorder, recurrent, severe with psychotic symptoms: Secondary | ICD-10-CM | POA: Diagnosis not present

## 2016-11-29 DIAGNOSIS — I69354 Hemiplegia and hemiparesis following cerebral infarction affecting left non-dominant side: Secondary | ICD-10-CM | POA: Diagnosis not present

## 2016-11-29 DIAGNOSIS — I82502 Chronic embolism and thrombosis of unspecified deep veins of left lower extremity: Secondary | ICD-10-CM | POA: Diagnosis not present

## 2016-11-29 DIAGNOSIS — R2689 Other abnormalities of gait and mobility: Secondary | ICD-10-CM | POA: Diagnosis not present

## 2016-11-29 DIAGNOSIS — E119 Type 2 diabetes mellitus without complications: Secondary | ICD-10-CM | POA: Diagnosis not present

## 2016-11-30 DIAGNOSIS — E119 Type 2 diabetes mellitus without complications: Secondary | ICD-10-CM | POA: Diagnosis not present

## 2016-11-30 DIAGNOSIS — I10 Essential (primary) hypertension: Secondary | ICD-10-CM | POA: Diagnosis not present

## 2016-11-30 DIAGNOSIS — I82502 Chronic embolism and thrombosis of unspecified deep veins of left lower extremity: Secondary | ICD-10-CM | POA: Diagnosis not present

## 2016-11-30 DIAGNOSIS — F333 Major depressive disorder, recurrent, severe with psychotic symptoms: Secondary | ICD-10-CM | POA: Diagnosis not present

## 2016-11-30 DIAGNOSIS — I69354 Hemiplegia and hemiparesis following cerebral infarction affecting left non-dominant side: Secondary | ICD-10-CM | POA: Diagnosis not present

## 2016-11-30 DIAGNOSIS — R2689 Other abnormalities of gait and mobility: Secondary | ICD-10-CM | POA: Diagnosis not present

## 2016-12-04 ENCOUNTER — Ambulatory Visit (INDEPENDENT_AMBULATORY_CARE_PROVIDER_SITE_OTHER): Payer: Medicare Other | Admitting: *Deleted

## 2016-12-04 DIAGNOSIS — Z5181 Encounter for therapeutic drug level monitoring: Secondary | ICD-10-CM | POA: Diagnosis not present

## 2016-12-04 DIAGNOSIS — Z8679 Personal history of other diseases of the circulatory system: Secondary | ICD-10-CM

## 2016-12-04 LAB — POCT INR: INR: 3.3

## 2016-12-05 DIAGNOSIS — I69354 Hemiplegia and hemiparesis following cerebral infarction affecting left non-dominant side: Secondary | ICD-10-CM | POA: Diagnosis not present

## 2016-12-05 DIAGNOSIS — E119 Type 2 diabetes mellitus without complications: Secondary | ICD-10-CM | POA: Diagnosis not present

## 2016-12-05 DIAGNOSIS — I10 Essential (primary) hypertension: Secondary | ICD-10-CM | POA: Diagnosis not present

## 2016-12-05 DIAGNOSIS — R2689 Other abnormalities of gait and mobility: Secondary | ICD-10-CM | POA: Diagnosis not present

## 2016-12-05 DIAGNOSIS — I82502 Chronic embolism and thrombosis of unspecified deep veins of left lower extremity: Secondary | ICD-10-CM | POA: Diagnosis not present

## 2016-12-05 DIAGNOSIS — F333 Major depressive disorder, recurrent, severe with psychotic symptoms: Secondary | ICD-10-CM | POA: Diagnosis not present

## 2016-12-07 DIAGNOSIS — H25813 Combined forms of age-related cataract, bilateral: Secondary | ICD-10-CM | POA: Diagnosis not present

## 2016-12-07 DIAGNOSIS — E119 Type 2 diabetes mellitus without complications: Secondary | ICD-10-CM | POA: Diagnosis not present

## 2016-12-07 DIAGNOSIS — Z7984 Long term (current) use of oral hypoglycemic drugs: Secondary | ICD-10-CM | POA: Diagnosis not present

## 2016-12-07 DIAGNOSIS — Z794 Long term (current) use of insulin: Secondary | ICD-10-CM | POA: Diagnosis not present

## 2016-12-08 DIAGNOSIS — I69354 Hemiplegia and hemiparesis following cerebral infarction affecting left non-dominant side: Secondary | ICD-10-CM | POA: Diagnosis not present

## 2016-12-08 DIAGNOSIS — R2689 Other abnormalities of gait and mobility: Secondary | ICD-10-CM | POA: Diagnosis not present

## 2016-12-08 DIAGNOSIS — E119 Type 2 diabetes mellitus without complications: Secondary | ICD-10-CM | POA: Diagnosis not present

## 2016-12-08 DIAGNOSIS — F333 Major depressive disorder, recurrent, severe with psychotic symptoms: Secondary | ICD-10-CM | POA: Diagnosis not present

## 2016-12-08 DIAGNOSIS — I10 Essential (primary) hypertension: Secondary | ICD-10-CM | POA: Diagnosis not present

## 2016-12-08 DIAGNOSIS — I82502 Chronic embolism and thrombosis of unspecified deep veins of left lower extremity: Secondary | ICD-10-CM | POA: Diagnosis not present

## 2016-12-13 DIAGNOSIS — I69354 Hemiplegia and hemiparesis following cerebral infarction affecting left non-dominant side: Secondary | ICD-10-CM | POA: Diagnosis not present

## 2016-12-13 DIAGNOSIS — R2689 Other abnormalities of gait and mobility: Secondary | ICD-10-CM | POA: Diagnosis not present

## 2016-12-13 DIAGNOSIS — I10 Essential (primary) hypertension: Secondary | ICD-10-CM | POA: Diagnosis not present

## 2016-12-13 DIAGNOSIS — I82502 Chronic embolism and thrombosis of unspecified deep veins of left lower extremity: Secondary | ICD-10-CM | POA: Diagnosis not present

## 2016-12-13 DIAGNOSIS — F333 Major depressive disorder, recurrent, severe with psychotic symptoms: Secondary | ICD-10-CM | POA: Diagnosis not present

## 2016-12-13 DIAGNOSIS — E119 Type 2 diabetes mellitus without complications: Secondary | ICD-10-CM | POA: Diagnosis not present

## 2016-12-18 DIAGNOSIS — I10 Essential (primary) hypertension: Secondary | ICD-10-CM | POA: Diagnosis not present

## 2016-12-18 DIAGNOSIS — F333 Major depressive disorder, recurrent, severe with psychotic symptoms: Secondary | ICD-10-CM | POA: Diagnosis not present

## 2016-12-18 DIAGNOSIS — E1159 Type 2 diabetes mellitus with other circulatory complications: Secondary | ICD-10-CM | POA: Diagnosis not present

## 2016-12-18 DIAGNOSIS — Z9181 History of falling: Secondary | ICD-10-CM | POA: Diagnosis not present

## 2016-12-18 DIAGNOSIS — I69354 Hemiplegia and hemiparesis following cerebral infarction affecting left non-dominant side: Secondary | ICD-10-CM | POA: Diagnosis not present

## 2016-12-18 DIAGNOSIS — I82502 Chronic embolism and thrombosis of unspecified deep veins of left lower extremity: Secondary | ICD-10-CM | POA: Diagnosis not present

## 2016-12-18 DIAGNOSIS — E119 Type 2 diabetes mellitus without complications: Secondary | ICD-10-CM | POA: Diagnosis not present

## 2016-12-19 DIAGNOSIS — Z9181 History of falling: Secondary | ICD-10-CM | POA: Diagnosis not present

## 2016-12-19 DIAGNOSIS — F333 Major depressive disorder, recurrent, severe with psychotic symptoms: Secondary | ICD-10-CM | POA: Diagnosis not present

## 2016-12-19 DIAGNOSIS — E119 Type 2 diabetes mellitus without complications: Secondary | ICD-10-CM | POA: Diagnosis not present

## 2016-12-19 DIAGNOSIS — I10 Essential (primary) hypertension: Secondary | ICD-10-CM | POA: Diagnosis not present

## 2016-12-19 DIAGNOSIS — I69354 Hemiplegia and hemiparesis following cerebral infarction affecting left non-dominant side: Secondary | ICD-10-CM | POA: Diagnosis not present

## 2016-12-19 DIAGNOSIS — I82502 Chronic embolism and thrombosis of unspecified deep veins of left lower extremity: Secondary | ICD-10-CM | POA: Diagnosis not present

## 2016-12-19 LAB — RENAL FUNCTION PANEL
Albumin: 4.1 g/dL (ref 3.6–5.1)
BUN: 9 mg/dL (ref 7–25)
CO2: 31 mmol/L (ref 20–32)
Calcium: 9.3 mg/dL (ref 8.6–10.4)
Chloride: 100 mmol/L (ref 98–110)
Creat: 0.6 mg/dL (ref 0.60–0.93)
Glucose, Bld: 200 mg/dL — ABNORMAL HIGH (ref 65–99)
Phosphorus: 3.9 mg/dL (ref 2.1–4.3)
Potassium: 4.3 mmol/L (ref 3.5–5.3)
Sodium: 137 mmol/L (ref 135–146)

## 2016-12-19 LAB — HEMOGLOBIN A1C
Hgb A1c MFr Bld: 6.6 % of total Hgb — ABNORMAL HIGH (ref <5.7)
Mean Plasma Glucose: 143 (calc)
eAG (mmol/L): 7.9 (calc)

## 2016-12-21 DIAGNOSIS — E119 Type 2 diabetes mellitus without complications: Secondary | ICD-10-CM | POA: Diagnosis not present

## 2016-12-21 DIAGNOSIS — I10 Essential (primary) hypertension: Secondary | ICD-10-CM | POA: Diagnosis not present

## 2016-12-21 DIAGNOSIS — F333 Major depressive disorder, recurrent, severe with psychotic symptoms: Secondary | ICD-10-CM | POA: Diagnosis not present

## 2016-12-21 DIAGNOSIS — I69354 Hemiplegia and hemiparesis following cerebral infarction affecting left non-dominant side: Secondary | ICD-10-CM | POA: Diagnosis not present

## 2016-12-21 DIAGNOSIS — I82502 Chronic embolism and thrombosis of unspecified deep veins of left lower extremity: Secondary | ICD-10-CM | POA: Diagnosis not present

## 2016-12-21 DIAGNOSIS — Z9181 History of falling: Secondary | ICD-10-CM | POA: Diagnosis not present

## 2016-12-22 ENCOUNTER — Other Ambulatory Visit: Payer: Self-pay | Admitting: "Endocrinology

## 2016-12-25 ENCOUNTER — Ambulatory Visit (INDEPENDENT_AMBULATORY_CARE_PROVIDER_SITE_OTHER): Payer: Medicare Other | Admitting: *Deleted

## 2016-12-25 DIAGNOSIS — M79675 Pain in left toe(s): Secondary | ICD-10-CM | POA: Diagnosis not present

## 2016-12-25 DIAGNOSIS — Z8679 Personal history of other diseases of the circulatory system: Secondary | ICD-10-CM | POA: Diagnosis not present

## 2016-12-25 DIAGNOSIS — I82409 Acute embolism and thrombosis of unspecified deep veins of unspecified lower extremity: Secondary | ICD-10-CM | POA: Diagnosis not present

## 2016-12-25 DIAGNOSIS — B351 Tinea unguium: Secondary | ICD-10-CM | POA: Diagnosis not present

## 2016-12-25 DIAGNOSIS — M79674 Pain in right toe(s): Secondary | ICD-10-CM | POA: Diagnosis not present

## 2016-12-25 DIAGNOSIS — Z5181 Encounter for therapeutic drug level monitoring: Secondary | ICD-10-CM

## 2016-12-25 LAB — POCT INR: INR: 2

## 2016-12-26 ENCOUNTER — Ambulatory Visit (INDEPENDENT_AMBULATORY_CARE_PROVIDER_SITE_OTHER): Payer: Medicare Other | Admitting: "Endocrinology

## 2016-12-26 ENCOUNTER — Encounter: Payer: Self-pay | Admitting: "Endocrinology

## 2016-12-26 VITALS — BP 117/78 | HR 78 | Ht 66.0 in

## 2016-12-26 DIAGNOSIS — I10 Essential (primary) hypertension: Secondary | ICD-10-CM

## 2016-12-26 DIAGNOSIS — E1159 Type 2 diabetes mellitus with other circulatory complications: Secondary | ICD-10-CM | POA: Diagnosis not present

## 2016-12-26 DIAGNOSIS — E782 Mixed hyperlipidemia: Secondary | ICD-10-CM

## 2016-12-26 MED ORDER — INSULIN ASPART 100 UNIT/ML FLEXPEN: 10.0000 [IU] | PEN_INJECTOR | Freq: Three times a day (TID) | SUBCUTANEOUS | 2 refills | Status: DC

## 2016-12-26 NOTE — Progress Notes (Signed)
Subjective:    Patient ID: [name], female    DOB: March 29, 1945,    Past Medical History:  Diagnosis Date  . Anxiety   . Arthritis   . Depression    History of psychosis and previous suicide attempt  . DVT, lower extremity, recurrent (HCC)    Long-term Coumadin per Dr. Legrand Rams  . Essential hypertension   . GERD (gastroesophageal reflux disease)   . Hemiplegia ([place]) 2010   Left side  . History of stroke    Acute infarct and right cerebral white matter small vessel disease 12/10  . Leg DVT (deep venous thromboembolism), acute ([place]) 2006  . Schizophrenia ([place])   . Stroke [facility])    left sided weakness  . Type 2 diabetes mellitus ([place])    Past Surgical History:  Procedure Laterality Date  . BACK SURGERY    . BIOPSY N/A 11/24/2014   Procedure: BIOPSY;  Surgeon: Danie Binder, MD;  Location: [facility];  Service: Endoscopy;  Laterality: N/A;  . BIOPSY  05/17/2016   Procedure: BIOPSY;  Surgeon: Danie Binder, MD;  Location: [facility] END[facility] SU[facility];  Service: Endoscopy;;  gastric biopsy  . COLONOSCOPY WITH PROPOFOL N/A 11/24/2014   Dr. Rudie Meyer polyps removed/moderate sized internal hemorrhoids, tubular adenomas. Next surveillance in 3 years  . ESOPHAGOGASTRODUODENOSCOPY (EGD) WITH PROPOFOL N/A 11/24/2014   Dr. Clayburn Pert HH/patent stricture at the gastroesophageal junction, mild non-erosive gastritis, path negative for H.pylori or celiac sprue  . ESOPHAGOGASTRODUODENOSCOPY (EGD) WITH PROPOFOL N/A 05/17/2016   Procedure: ESOPHAGOGASTRODUODENOSCOPY (EGD) WITH PROPOFOL;  Surgeon: Danie Binder, MD;  Location: [facility];  Service: Endoscopy;  Laterality: N/A;  12:45pm  . GIVENS CAPSULE STUDY N/A 12/11/2014   MULTILPLE EROSION IN the stomach WITH ACTIVE OOZING. OCCASIONAL EROSIONS AND RARE ULCER SEEN IN PROXIMAL SMALL BOWEL . No masses or AVMs SEEN. NO OLD BLOOD OR FRESH BLOOD SEEN.   . POLYPECTOMY N/A 11/24/2014   Procedure: POLYPECTOMY;  Surgeon: Danie Binder, MD;  Location: [facility];   Service: Endoscopy;  Laterality: N/A;  . SAVORY DILATION N/A 05/17/2016   Procedure: SAVORY DILATION;  Surgeon: Danie Binder, MD;  Location: [facility];  Service: Endoscopy;  Laterality: N/A;   Social History   Socioeconomic History  . Marital status: Widowed    Spouse name: None  . Number of children: None  . Years of education: None  . Highest education level: None  Social Needs  . Financial resource strain: None  . Food insecurity - worry: None  . Food insecurity - inability: None  . Transportation needs - medical: None  . Transportation needs - non-medical: None  Occupational History  . None  Tobacco Use  . Smoking status: Former Smoker    Packs/day: 0.25    Years: 20.00    Pack years: 5.00    Types: Cigarettes    Last attempt to quit: 01/24/1995    Years since quitting: 21.9  . Smokeless tobacco: Never Used  Substance and Sexual Activity  . Alcohol use: No    Alcohol/week: 0.0 oz  . Drug use: No  . Sexual activity: No    Birth control/protection: Post-menopausal  Other Topics Concern  . None  Social History Narrative  . None   Outpatient Encounter Medications as of 12/26/2016  Medication Sig  . acetaminophen (TYLENOL) 325 MG tablet Take 650 mg by mouth every 6 (six) hours as needed. For fever > 101   . ARIPiprazole (ABILIFY) 2 MG tablet Take  2 mg by mouth daily.   . calcium carbonate (TUMS - DOSED IN MG ELEMENTAL CALCIUM) 500 MG chewable tablet Chew 1 tablet by mouth 4 (four) times daily as needed for indigestion or heartburn.   . clonazePAM (KLONOPIN) 0.5 MG tablet Take 1 tablet (0.5 mg total) by mouth 4 (four) times daily.  . DULoxetine (CYMBALTA) 60 MG capsule Take 120 mg by mouth daily.   Marland Kitchen HYDROcodone-acetaminophen (NORCO/VICODIN) 5-325 MG tablet Take 1 tablet by mouth 3 (three) times daily.  . Insulin Aspart (NOVOLOG FLEXPEN [place]) Inject 15-21 Units into the skin 3 (three) times daily before meals.  . insulin aspart (NOVOLOG FLEXPEN) 100 UNIT/ML FlexPen  Inject 10-16 Units into the skin 3 (three) times daily with meals.  . iron polysaccharides (NIFEREX) 150 MG capsule Take 1 capsule (150 mg total) by mouth daily.  Marland Kitchen latanoprost (XALATAN) 0.005 % ophthalmic solution Place 1 drop into both eyes at bedtime.  Marland Kitchen LEVEMIR FLEXTOUCH 100 UNIT/ML Pen INJECT 50 UNITS SUBCUTANEOUSLY AT BEDTIME.  Marland Kitchen loperamide (IMODIUM) 2 MG capsule Take 4 mg by mouth daily as needed for diarrhea or loose stools. Take 2 capsules initially then take 2 after each loose stool per MAR  . metoCLOPramide (REGLAN) 5 MG tablet Take 1 tablet (5 mg total) by mouth every 8 (eight) hours as needed for nausea or vomiting (or bloating).  . metoprolol succinate (TOPROL-XL) 25 MG 24 hr tablet Take 12.5 mg by mouth 2 (two) times daily.   . mirtazapine (REMERON) 30 MG tablet Take 30 mg by mouth at bedtime.    . naloxegol oxalate (MOVANTIK) 12.5 MG TABS tablet Take 12.5 mg by mouth daily.  Marland Kitchen NOVOFINE AUTOCOVER 30G X 8 MM MISC   . omeprazole (PRILOSEC) 40 MG capsule Take 40 mg by mouth daily.  . ondansetron (ZOFRAN) 4 MG tablet Take 1 tablet (4 mg total) by mouth 4 (four) times daily -  before meals and at bedtime.  . polyethylene glycol powder (GLYCOLAX/MIRALAX) powder Take 17 g by mouth daily as needed.  . polyvinyl alcohol (ARTIFICIAL TEARS) 1.4 % ophthalmic solution Place 1 drop into both eyes 3 (three) times daily.  . promethazine (PHENERGAN) 12.5 MG tablet TAKE 1 TABLET BY MOUTH EVERY 6 HOURS AS NEEDED FOR NAUSEA OR VOMITING.  . risperiDONE (RISPERDAL) 3 MG tablet Take 3 mg by mouth 2 (two) times daily.   . simvastatin (ZOCOR) 20 MG tablet Take 20 mg by mouth at bedtime.   . solifenacin (VESICARE) 5 MG tablet Take 5 mg by mouth daily.  Marland Kitchen tiZANidine (ZANAFLEX) 2 MG tablet Take 2 mg by mouth daily.  . Vitamin D, Ergocalciferol, (DRISDOL) 50000 UNITS CAPS capsule Take 50,000 Units by mouth every Friday.   . warfarin (COUMADIN) 5 MG tablet Take 1 tablet daily except 1/2 tablet on Mondays,  Wednesdays and Fridays  . zolpidem (AMBIEN) 10 MG tablet Take 1 tablet (10 mg total) by mouth at bedtime.  . [DISCONTINUED] LEVEMIR FLEXTOUCH 100 UNIT/ML Pen INJECT 50 UNITS SUBCUTANEOUSLY AT BEDTIME.  . [DISCONTINUED] NOVOLOG FLEXPEN 100 UNIT/ML FlexPen INJECT 15 UNITS SUBCUTANEOUSLY BEFORE MEALS IF BS IS BETWEEN 90-150.   No facility-administered encounter medications on file as of 12/26/2016.    ALLERGIES: Allergies  Allergen Reactions  . Sulfa Antibiotics Rash  . Sulfonamide Derivatives Rash    REACTION: rash   VACCINATION STATUS: There is no immunization history for the selected administration types on file for this patient.  Diabetes  She presents for her follow-up diabetic visit. She has  type 2 diabetes mellitus. Onset time: She was diagnosed at approximate age of 43 years. Her disease course has been stable. There are no hypoglycemic associated symptoms. Pertinent negatives for hypoglycemia include no confusion, pallor or seizures. Pertinent negatives for diabetes include no polydipsia, no polyphagia and no polyuria. There are no hypoglycemic complications. Symptoms are stable. Diabetic complications include a CVA. Risk factors for coronary artery disease include dyslipidemia, diabetes mellitus, obesity, sedentary lifestyle and hypertension. Current diabetic treatment includes intensive insulin program and oral agent (monotherapy) (She kept requiring large dose of insulin due to consumption of large quantities of processed carbohydrates.Marland Kitchen). Her weight is stable. She is following a generally unhealthy diet. When asked about meal planning, she reported none. She never participates in exercise. Her home blood glucose trend is decreasing steadily. Her breakfast blood glucose range is generally 130-140 mg/dl. Her lunch blood glucose range is generally 130-140 mg/dl. Her dinner blood glucose range is generally 130-140 mg/dl. Her bedtime blood glucose range is generally 130-140 mg/dl. Her overall  blood glucose range is 130-140 mg/dl. An ACE inhibitor/angiotensin II receptor blocker is being taken.  Hypertension  This is a chronic problem. The current episode started more than 1 year ago. The problem is controlled. Pertinent negatives include no palpitations or shortness of breath. Risk factors for coronary artery disease include dyslipidemia and diabetes mellitus. Hypertensive end-organ damage includes CVA.  Hyperlipidemia  This is a chronic problem. The current episode started more than 1 year ago. Exacerbating diseases include diabetes. Pertinent negatives include no shortness of breath. Risk factors for coronary artery disease include diabetes mellitus, dyslipidemia, hypertension, obesity and a sedentary lifestyle.    Review of Systems  Constitutional: Negative for unexpected weight change.       Uses walker to get around.   HENT: Negative for trouble swallowing and voice change.   Eyes: Negative for visual disturbance.  Respiratory: Negative for shortness of breath and wheezing.   Cardiovascular: Negative for palpitations and leg swelling.  Gastrointestinal: Negative for diarrhea.  Endocrine: Negative for cold intolerance, heat intolerance, polydipsia, polyphagia and polyuria.  Skin: Negative for color change, pallor and wound.  Neurological: Negative for seizures.  Psychiatric/Behavioral: Negative for confusion and suicidal ideas.    Objective:    BP 117/78   Pulse 78   Ht 5\' 6"  (1.676 m)   BMI 26.55 kg/m   Wt Readings from Last 3 Encounters:  10/06/16 164 lb 8 oz (74.6 kg)  05/23/16 159 lb (72.1 kg)  05/16/16 157 lb (71.2 kg)    Physical Exam  Constitutional: She is oriented to person, place, and time. She appears well-developed.  HENT:  Head: Normocephalic and atraumatic.  She is a mouth breather, has dry mucous membranes.  Eyes: EOM are normal.  Neck: Normal range of motion. Neck supple. No tracheal deviation present. No thyromegaly present.  Cardiovascular:  Normal rate and regular rhythm.  Pulmonary/Chest: Effort normal and breath sounds normal.  Abdominal: Soft. Bowel sounds are normal. There is no tenderness. There is no guarding.  Musculoskeletal: Normal range of motion. She exhibits no edema.  Neurological: She is alert and oriented to person, place, and time. She has normal reflexes.  She uses walker to get around.  Skin: Skin is warm and dry. No rash noted. No erythema. No pallor.  Psychiatric: She has a normal mood and affect. Judgment normal.    Results for orders placed or performed in visit on 12/25/16  POCT INR  Result Value Ref Range   INR 2.0  Complete Blood Count (Most recent): Lab Results  Component Value Date   WBC 6.6 10/30/2016   HGB 12.7 10/30/2016   HCT 37.6 10/30/2016   MCV 90.0 10/30/2016   PLT 245 10/30/2016   Chemistry (most recent): Lab Results  Component Value Date   NA 137 12/18/2016   K 4.3 12/18/2016   CL 100 12/18/2016   CO2 31 12/18/2016   BUN 9 12/18/2016   CREATININE 0.60 12/18/2016   Diabetic Labs (most recent): Lab Results  Component Value Date   HGBA1C 6.6 (H) 12/18/2016   HGBA1C 6.7 (H) 10/07/2016   HGBA1C 6.9 (H) 09/06/2016   Lipid Panel     Component Value Date/Time   CHOL 133 10/07/2016 0620   TRIG 252 (H) 10/07/2016 0620   HDL 28 (L) 10/07/2016 0620   CHOLHDL 4.8 10/07/2016 0620   VLDL 50 (H) 10/07/2016 0620   LDLCALC 55 10/07/2016 0620     Assessment & Plan:   1. Type 2 diabetes mellitus with vascular disease ([place]) Her diabetes is  complicated by recurrent CVA. Patient came with improved glucose profile,  Did have some random hypoglycemia due to poor appetite. A1c remains controlled at 6.6%,  improving from 8.2%.   Recent labs reviewed. - Patient remains at a high risk for more acute and chronic complications of diabetes which include CAD, CVA, CKD, retinopathy, and neuropathy. These are all discussed in detail with the patient.  - I have re-counseled the patient  on diet management and weight loss  by adopting a carbohydrate restricted / protein rich  Diet.  - Patient is advised to stick to a routine mealtimes to eat 3 meals  a day and avoid unnecessary snacks ( to snack only to correct hypoglycemia).  -  Suggestion is made for her to avoid simple carbohydrates  from her diet including Cakes, Sweet Desserts / Pastries, Ice Cream, Soda (diet and regular), Sweet Tea, Candies, Chips, Cookies, Store Bought Juices, Alcohol in Excess of  1-2 drinks a day, Artificial Sweeteners, and "Sugar-free" Products. This will help patient to have stable blood glucose profile and potentially avoid unintended weight gain.   - I have approached patient with the following individualized plan to manage diabetes and patient agrees.  - Continue Levemir 50 units  daily at bedtime, lower NovoLog to 10 units 3 times a day before meals for premeal blood glucose readings of 90-150mg /dl, plus patient specific sliding scale insulin for correction of unexpected hyperglycemia above 150mg /dl, associated with strict monitoring of blood glucose 4 times a day-before meals and at bedtime.   -Adjustment parameters for hypo and hyperglycemia were given in a written document to patient. -Patient is encouraged to call clinic for blood glucose levels less than 70 or above 300 mg /dl.  - she did not tolerate Metformin. - Patient specific target  for A1c; LDL, HDL, Triglycerides, and  Waist Circumference were discussed in detail.  2) BP/HTN: Controlled. Continue current medications including ACEI. She is only on very low-dose metoprolol and very low-dose lisinopril. If she becomes symptomatic of hypotension, one of these medications should be stopped.  3) Lipids/HPL: continue statins. 4)  Weight/Diet:  exercise, and carbohydrates information provided.  5) Chronic Care/Health Maintenance:  - I have advised 111 assistance at mealtimes and intelligent hydration . Patient is  on ACEI and Statin  medications and encouraged to continue to follow up with Ophthalmology, Podiatrist at least yearly or according to recommendations, and advised to  stay away from smoking. I have recommended  yearly flu vaccine and pneumonia vaccination at least every 5 years; and  sleep for at least 7 hours a day.  I advised patient to maintain close follow up with her PCP for primary care needs.  - Time spent with the patient: 25 min, of which >50% was spent in reviewing her sugar logs , discussing her hypo- and hyper-glycemic episodes, reviewing her current and  previous labs and insulin doses and developing a plan to avoid hypo- and hyper-glycemia.    Follow up plan: Return in about 3 months (around 03/26/2017) for follow up with pre-visit labs, meter, and logs.  Glade Lloyd, MD Phone: [phone]  Fax: [phone]   This note was partially dictated with voice recognition software. Similar sounding words can be transcribed inadequately or may not  be corrected upon review.  12/26/2016, 4:50 PM

## 2016-12-26 NOTE — Patient Instructions (Signed)

## 2016-12-27 DIAGNOSIS — I10 Essential (primary) hypertension: Secondary | ICD-10-CM | POA: Diagnosis not present

## 2016-12-27 DIAGNOSIS — I82502 Chronic embolism and thrombosis of unspecified deep veins of left lower extremity: Secondary | ICD-10-CM | POA: Diagnosis not present

## 2016-12-27 DIAGNOSIS — I69354 Hemiplegia and hemiparesis following cerebral infarction affecting left non-dominant side: Secondary | ICD-10-CM | POA: Diagnosis not present

## 2016-12-27 DIAGNOSIS — F333 Major depressive disorder, recurrent, severe with psychotic symptoms: Secondary | ICD-10-CM | POA: Diagnosis not present

## 2016-12-27 DIAGNOSIS — E119 Type 2 diabetes mellitus without complications: Secondary | ICD-10-CM | POA: Diagnosis not present

## 2016-12-27 DIAGNOSIS — Z9181 History of falling: Secondary | ICD-10-CM | POA: Diagnosis not present

## 2016-12-28 DIAGNOSIS — F333 Major depressive disorder, recurrent, severe with psychotic symptoms: Secondary | ICD-10-CM | POA: Diagnosis not present

## 2016-12-28 DIAGNOSIS — I825Y9 Chronic embolism and thrombosis of unspecified deep veins of unspecified proximal lower extremity: Secondary | ICD-10-CM | POA: Diagnosis not present

## 2016-12-28 DIAGNOSIS — G8190 Hemiplegia, unspecified affecting unspecified side: Secondary | ICD-10-CM | POA: Diagnosis not present

## 2016-12-28 DIAGNOSIS — Z1389 Encounter for screening for other disorder: Secondary | ICD-10-CM | POA: Diagnosis not present

## 2016-12-28 DIAGNOSIS — E1165 Type 2 diabetes mellitus with hyperglycemia: Secondary | ICD-10-CM | POA: Diagnosis not present

## 2016-12-29 ENCOUNTER — Other Ambulatory Visit: Payer: Self-pay | Admitting: "Endocrinology

## 2016-12-29 DIAGNOSIS — I82502 Chronic embolism and thrombosis of unspecified deep veins of left lower extremity: Secondary | ICD-10-CM | POA: Diagnosis not present

## 2016-12-29 DIAGNOSIS — E119 Type 2 diabetes mellitus without complications: Secondary | ICD-10-CM | POA: Diagnosis not present

## 2016-12-29 DIAGNOSIS — Z9181 History of falling: Secondary | ICD-10-CM | POA: Diagnosis not present

## 2016-12-29 DIAGNOSIS — F333 Major depressive disorder, recurrent, severe with psychotic symptoms: Secondary | ICD-10-CM | POA: Diagnosis not present

## 2016-12-29 DIAGNOSIS — I69354 Hemiplegia and hemiparesis following cerebral infarction affecting left non-dominant side: Secondary | ICD-10-CM | POA: Diagnosis not present

## 2016-12-29 DIAGNOSIS — I10 Essential (primary) hypertension: Secondary | ICD-10-CM | POA: Diagnosis not present

## 2017-01-03 DIAGNOSIS — I69354 Hemiplegia and hemiparesis following cerebral infarction affecting left non-dominant side: Secondary | ICD-10-CM | POA: Diagnosis not present

## 2017-01-03 DIAGNOSIS — I10 Essential (primary) hypertension: Secondary | ICD-10-CM | POA: Diagnosis not present

## 2017-01-03 DIAGNOSIS — E119 Type 2 diabetes mellitus without complications: Secondary | ICD-10-CM | POA: Diagnosis not present

## 2017-01-03 DIAGNOSIS — Z9181 History of falling: Secondary | ICD-10-CM | POA: Diagnosis not present

## 2017-01-03 DIAGNOSIS — F333 Major depressive disorder, recurrent, severe with psychotic symptoms: Secondary | ICD-10-CM | POA: Diagnosis not present

## 2017-01-03 DIAGNOSIS — I82502 Chronic embolism and thrombosis of unspecified deep veins of left lower extremity: Secondary | ICD-10-CM | POA: Diagnosis not present

## 2017-01-05 ENCOUNTER — Other Ambulatory Visit: Payer: Self-pay | Admitting: Cardiology

## 2017-01-05 DIAGNOSIS — I69354 Hemiplegia and hemiparesis following cerebral infarction affecting left non-dominant side: Secondary | ICD-10-CM | POA: Diagnosis not present

## 2017-01-05 DIAGNOSIS — I10 Essential (primary) hypertension: Secondary | ICD-10-CM | POA: Diagnosis not present

## 2017-01-05 DIAGNOSIS — F333 Major depressive disorder, recurrent, severe with psychotic symptoms: Secondary | ICD-10-CM | POA: Diagnosis not present

## 2017-01-05 DIAGNOSIS — I82502 Chronic embolism and thrombosis of unspecified deep veins of left lower extremity: Secondary | ICD-10-CM | POA: Diagnosis not present

## 2017-01-05 DIAGNOSIS — Z9181 History of falling: Secondary | ICD-10-CM | POA: Diagnosis not present

## 2017-01-05 DIAGNOSIS — E119 Type 2 diabetes mellitus without complications: Secondary | ICD-10-CM | POA: Diagnosis not present

## 2017-01-08 DIAGNOSIS — E119 Type 2 diabetes mellitus without complications: Secondary | ICD-10-CM | POA: Diagnosis not present

## 2017-01-08 DIAGNOSIS — F333 Major depressive disorder, recurrent, severe with psychotic symptoms: Secondary | ICD-10-CM | POA: Diagnosis not present

## 2017-01-08 DIAGNOSIS — I10 Essential (primary) hypertension: Secondary | ICD-10-CM | POA: Diagnosis not present

## 2017-01-08 DIAGNOSIS — I69354 Hemiplegia and hemiparesis following cerebral infarction affecting left non-dominant side: Secondary | ICD-10-CM | POA: Diagnosis not present

## 2017-01-08 DIAGNOSIS — I82502 Chronic embolism and thrombosis of unspecified deep veins of left lower extremity: Secondary | ICD-10-CM | POA: Diagnosis not present

## 2017-01-08 DIAGNOSIS — Z9181 History of falling: Secondary | ICD-10-CM | POA: Diagnosis not present

## 2017-01-09 DIAGNOSIS — I69354 Hemiplegia and hemiparesis following cerebral infarction affecting left non-dominant side: Secondary | ICD-10-CM | POA: Diagnosis not present

## 2017-01-09 DIAGNOSIS — E119 Type 2 diabetes mellitus without complications: Secondary | ICD-10-CM | POA: Diagnosis not present

## 2017-01-09 DIAGNOSIS — I10 Essential (primary) hypertension: Secondary | ICD-10-CM | POA: Diagnosis not present

## 2017-01-09 DIAGNOSIS — F333 Major depressive disorder, recurrent, severe with psychotic symptoms: Secondary | ICD-10-CM | POA: Diagnosis not present

## 2017-01-09 DIAGNOSIS — Z9181 History of falling: Secondary | ICD-10-CM | POA: Diagnosis not present

## 2017-01-09 DIAGNOSIS — I82502 Chronic embolism and thrombosis of unspecified deep veins of left lower extremity: Secondary | ICD-10-CM | POA: Diagnosis not present

## 2017-01-12 DIAGNOSIS — I82502 Chronic embolism and thrombosis of unspecified deep veins of left lower extremity: Secondary | ICD-10-CM | POA: Diagnosis not present

## 2017-01-12 DIAGNOSIS — I69354 Hemiplegia and hemiparesis following cerebral infarction affecting left non-dominant side: Secondary | ICD-10-CM | POA: Diagnosis not present

## 2017-01-12 DIAGNOSIS — F333 Major depressive disorder, recurrent, severe with psychotic symptoms: Secondary | ICD-10-CM | POA: Diagnosis not present

## 2017-01-12 DIAGNOSIS — I10 Essential (primary) hypertension: Secondary | ICD-10-CM | POA: Diagnosis not present

## 2017-01-12 DIAGNOSIS — E119 Type 2 diabetes mellitus without complications: Secondary | ICD-10-CM | POA: Diagnosis not present

## 2017-01-12 DIAGNOSIS — Z9181 History of falling: Secondary | ICD-10-CM | POA: Diagnosis not present

## 2017-01-15 DIAGNOSIS — F333 Major depressive disorder, recurrent, severe with psychotic symptoms: Secondary | ICD-10-CM | POA: Diagnosis not present

## 2017-01-15 DIAGNOSIS — E119 Type 2 diabetes mellitus without complications: Secondary | ICD-10-CM | POA: Diagnosis not present

## 2017-01-15 DIAGNOSIS — I10 Essential (primary) hypertension: Secondary | ICD-10-CM | POA: Diagnosis not present

## 2017-01-15 DIAGNOSIS — Z9181 History of falling: Secondary | ICD-10-CM | POA: Diagnosis not present

## 2017-01-15 DIAGNOSIS — I82502 Chronic embolism and thrombosis of unspecified deep veins of left lower extremity: Secondary | ICD-10-CM | POA: Diagnosis not present

## 2017-01-15 DIAGNOSIS — I69354 Hemiplegia and hemiparesis following cerebral infarction affecting left non-dominant side: Secondary | ICD-10-CM | POA: Diagnosis not present

## 2017-01-17 DIAGNOSIS — E119 Type 2 diabetes mellitus without complications: Secondary | ICD-10-CM | POA: Diagnosis not present

## 2017-01-17 DIAGNOSIS — Z9181 History of falling: Secondary | ICD-10-CM | POA: Diagnosis not present

## 2017-01-17 DIAGNOSIS — I82502 Chronic embolism and thrombosis of unspecified deep veins of left lower extremity: Secondary | ICD-10-CM | POA: Diagnosis not present

## 2017-01-17 DIAGNOSIS — F333 Major depressive disorder, recurrent, severe with psychotic symptoms: Secondary | ICD-10-CM | POA: Diagnosis not present

## 2017-01-17 DIAGNOSIS — I10 Essential (primary) hypertension: Secondary | ICD-10-CM | POA: Diagnosis not present

## 2017-01-17 DIAGNOSIS — I69354 Hemiplegia and hemiparesis following cerebral infarction affecting left non-dominant side: Secondary | ICD-10-CM | POA: Diagnosis not present

## 2017-01-18 DIAGNOSIS — I69354 Hemiplegia and hemiparesis following cerebral infarction affecting left non-dominant side: Secondary | ICD-10-CM | POA: Diagnosis not present

## 2017-01-18 DIAGNOSIS — E119 Type 2 diabetes mellitus without complications: Secondary | ICD-10-CM | POA: Diagnosis not present

## 2017-01-18 DIAGNOSIS — I82502 Chronic embolism and thrombosis of unspecified deep veins of left lower extremity: Secondary | ICD-10-CM | POA: Diagnosis not present

## 2017-01-18 DIAGNOSIS — Z9181 History of falling: Secondary | ICD-10-CM | POA: Diagnosis not present

## 2017-01-18 DIAGNOSIS — F333 Major depressive disorder, recurrent, severe with psychotic symptoms: Secondary | ICD-10-CM | POA: Diagnosis not present

## 2017-01-18 DIAGNOSIS — I10 Essential (primary) hypertension: Secondary | ICD-10-CM | POA: Diagnosis not present

## 2017-01-19 ENCOUNTER — Encounter: Payer: Self-pay | Admitting: *Deleted

## 2017-01-19 ENCOUNTER — Telehealth: Payer: Self-pay | Admitting: *Deleted

## 2017-01-19 NOTE — Telephone Encounter (Signed)
Butch Penny from [facility] called and stated that they need a letter faxed to [facility] stating that the appointment for patient on 01/24/17 is medically necessary and can not be changed. / tg

## 2017-01-19 NOTE — Progress Notes (Signed)
01/19/18  [facility]:  [name] (DOB 07/21/2045) has a coumadin appointment in our office on 01/24/45.  It is imperative that she has transportation to this appointment to get her blood level checked.  Thank you for your assistance in this matter!  Edrick Oh RN Coumadin Clinic [facility]

## 2017-01-19 NOTE — Telephone Encounter (Signed)
01/19/18  [facility]:  Druscilla Brownie (DOB 02-05-2045) has a coumadin appointment in our office on 01/24/45.  It is imperative that she has transportation to this appointment to get her blood level checked.  Thank you for your assistance in this matter!  Edrick Oh RN Coumadin Clinic [facility]

## 2017-01-22 DIAGNOSIS — I69354 Hemiplegia and hemiparesis following cerebral infarction affecting left non-dominant side: Secondary | ICD-10-CM | POA: Diagnosis not present

## 2017-01-22 DIAGNOSIS — I82502 Chronic embolism and thrombosis of unspecified deep veins of left lower extremity: Secondary | ICD-10-CM | POA: Diagnosis not present

## 2017-01-22 DIAGNOSIS — I10 Essential (primary) hypertension: Secondary | ICD-10-CM | POA: Diagnosis not present

## 2017-01-22 DIAGNOSIS — E119 Type 2 diabetes mellitus without complications: Secondary | ICD-10-CM | POA: Diagnosis not present

## 2017-01-22 DIAGNOSIS — Z9181 History of falling: Secondary | ICD-10-CM | POA: Diagnosis not present

## 2017-01-22 DIAGNOSIS — F333 Major depressive disorder, recurrent, severe with psychotic symptoms: Secondary | ICD-10-CM | POA: Diagnosis not present

## 2017-01-24 ENCOUNTER — Ambulatory Visit (INDEPENDENT_AMBULATORY_CARE_PROVIDER_SITE_OTHER): Payer: Medicare Other | Admitting: *Deleted

## 2017-01-24 DIAGNOSIS — I10 Essential (primary) hypertension: Secondary | ICD-10-CM | POA: Diagnosis not present

## 2017-01-24 DIAGNOSIS — I69354 Hemiplegia and hemiparesis following cerebral infarction affecting left non-dominant side: Secondary | ICD-10-CM | POA: Diagnosis not present

## 2017-01-24 DIAGNOSIS — Z8679 Personal history of other diseases of the circulatory system: Secondary | ICD-10-CM

## 2017-01-24 DIAGNOSIS — I82502 Chronic embolism and thrombosis of unspecified deep veins of left lower extremity: Secondary | ICD-10-CM | POA: Diagnosis not present

## 2017-01-24 DIAGNOSIS — E119 Type 2 diabetes mellitus without complications: Secondary | ICD-10-CM | POA: Diagnosis not present

## 2017-01-24 DIAGNOSIS — Z5181 Encounter for therapeutic drug level monitoring: Secondary | ICD-10-CM

## 2017-01-24 DIAGNOSIS — Z9181 History of falling: Secondary | ICD-10-CM | POA: Diagnosis not present

## 2017-01-24 DIAGNOSIS — F333 Major depressive disorder, recurrent, severe with psychotic symptoms: Secondary | ICD-10-CM | POA: Diagnosis not present

## 2017-01-24 LAB — POCT INR: INR: 3.3

## 2017-01-24 NOTE — Patient Instructions (Signed)
Hold coumadin tonight then resume 2.5mg  daily except 5mg  on Sundays, Tuesdays and Thursdays Recheck in 3

## 2017-01-29 DIAGNOSIS — E119 Type 2 diabetes mellitus without complications: Secondary | ICD-10-CM | POA: Diagnosis not present

## 2017-01-29 DIAGNOSIS — F333 Major depressive disorder, recurrent, severe with psychotic symptoms: Secondary | ICD-10-CM | POA: Diagnosis not present

## 2017-01-29 DIAGNOSIS — I10 Essential (primary) hypertension: Secondary | ICD-10-CM | POA: Diagnosis not present

## 2017-01-29 DIAGNOSIS — I82502 Chronic embolism and thrombosis of unspecified deep veins of left lower extremity: Secondary | ICD-10-CM | POA: Diagnosis not present

## 2017-01-29 DIAGNOSIS — Z9181 History of falling: Secondary | ICD-10-CM | POA: Diagnosis not present

## 2017-01-29 DIAGNOSIS — I69354 Hemiplegia and hemiparesis following cerebral infarction affecting left non-dominant side: Secondary | ICD-10-CM | POA: Diagnosis not present

## 2017-01-31 DIAGNOSIS — I69354 Hemiplegia and hemiparesis following cerebral infarction affecting left non-dominant side: Secondary | ICD-10-CM | POA: Diagnosis not present

## 2017-01-31 DIAGNOSIS — I10 Essential (primary) hypertension: Secondary | ICD-10-CM | POA: Diagnosis not present

## 2017-01-31 DIAGNOSIS — I82502 Chronic embolism and thrombosis of unspecified deep veins of left lower extremity: Secondary | ICD-10-CM | POA: Diagnosis not present

## 2017-01-31 DIAGNOSIS — F333 Major depressive disorder, recurrent, severe with psychotic symptoms: Secondary | ICD-10-CM | POA: Diagnosis not present

## 2017-01-31 DIAGNOSIS — E119 Type 2 diabetes mellitus without complications: Secondary | ICD-10-CM | POA: Diagnosis not present

## 2017-01-31 DIAGNOSIS — Z9181 History of falling: Secondary | ICD-10-CM | POA: Diagnosis not present

## 2017-02-05 DIAGNOSIS — Z9181 History of falling: Secondary | ICD-10-CM | POA: Diagnosis not present

## 2017-02-05 DIAGNOSIS — I69354 Hemiplegia and hemiparesis following cerebral infarction affecting left non-dominant side: Secondary | ICD-10-CM | POA: Diagnosis not present

## 2017-02-05 DIAGNOSIS — I10 Essential (primary) hypertension: Secondary | ICD-10-CM | POA: Diagnosis not present

## 2017-02-05 DIAGNOSIS — F333 Major depressive disorder, recurrent, severe with psychotic symptoms: Secondary | ICD-10-CM | POA: Diagnosis not present

## 2017-02-05 DIAGNOSIS — E119 Type 2 diabetes mellitus without complications: Secondary | ICD-10-CM | POA: Diagnosis not present

## 2017-02-05 DIAGNOSIS — I82502 Chronic embolism and thrombosis of unspecified deep veins of left lower extremity: Secondary | ICD-10-CM | POA: Diagnosis not present

## 2017-02-07 DIAGNOSIS — F333 Major depressive disorder, recurrent, severe with psychotic symptoms: Secondary | ICD-10-CM | POA: Diagnosis not present

## 2017-02-07 DIAGNOSIS — I69354 Hemiplegia and hemiparesis following cerebral infarction affecting left non-dominant side: Secondary | ICD-10-CM | POA: Diagnosis not present

## 2017-02-07 DIAGNOSIS — I10 Essential (primary) hypertension: Secondary | ICD-10-CM | POA: Diagnosis not present

## 2017-02-07 DIAGNOSIS — I82502 Chronic embolism and thrombosis of unspecified deep veins of left lower extremity: Secondary | ICD-10-CM | POA: Diagnosis not present

## 2017-02-07 DIAGNOSIS — E119 Type 2 diabetes mellitus without complications: Secondary | ICD-10-CM | POA: Diagnosis not present

## 2017-02-07 DIAGNOSIS — Z9181 History of falling: Secondary | ICD-10-CM | POA: Diagnosis not present

## 2017-02-08 DIAGNOSIS — I69354 Hemiplegia and hemiparesis following cerebral infarction affecting left non-dominant side: Secondary | ICD-10-CM | POA: Diagnosis not present

## 2017-02-08 DIAGNOSIS — Z9181 History of falling: Secondary | ICD-10-CM | POA: Diagnosis not present

## 2017-02-08 DIAGNOSIS — E119 Type 2 diabetes mellitus without complications: Secondary | ICD-10-CM | POA: Diagnosis not present

## 2017-02-08 DIAGNOSIS — I10 Essential (primary) hypertension: Secondary | ICD-10-CM | POA: Diagnosis not present

## 2017-02-08 DIAGNOSIS — F333 Major depressive disorder, recurrent, severe with psychotic symptoms: Secondary | ICD-10-CM | POA: Diagnosis not present

## 2017-02-08 DIAGNOSIS — I82502 Chronic embolism and thrombosis of unspecified deep veins of left lower extremity: Secondary | ICD-10-CM | POA: Diagnosis not present

## 2017-02-09 DIAGNOSIS — F333 Major depressive disorder, recurrent, severe with psychotic symptoms: Secondary | ICD-10-CM | POA: Diagnosis not present

## 2017-02-09 DIAGNOSIS — I69354 Hemiplegia and hemiparesis following cerebral infarction affecting left non-dominant side: Secondary | ICD-10-CM | POA: Diagnosis not present

## 2017-02-09 DIAGNOSIS — I82502 Chronic embolism and thrombosis of unspecified deep veins of left lower extremity: Secondary | ICD-10-CM | POA: Diagnosis not present

## 2017-02-09 DIAGNOSIS — Z9181 History of falling: Secondary | ICD-10-CM | POA: Diagnosis not present

## 2017-02-09 DIAGNOSIS — I10 Essential (primary) hypertension: Secondary | ICD-10-CM | POA: Diagnosis not present

## 2017-02-09 DIAGNOSIS — E119 Type 2 diabetes mellitus without complications: Secondary | ICD-10-CM | POA: Diagnosis not present

## 2017-02-12 DIAGNOSIS — E119 Type 2 diabetes mellitus without complications: Secondary | ICD-10-CM | POA: Diagnosis not present

## 2017-02-12 DIAGNOSIS — I10 Essential (primary) hypertension: Secondary | ICD-10-CM | POA: Diagnosis not present

## 2017-02-12 DIAGNOSIS — F333 Major depressive disorder, recurrent, severe with psychotic symptoms: Secondary | ICD-10-CM | POA: Diagnosis not present

## 2017-02-12 DIAGNOSIS — Z9181 History of falling: Secondary | ICD-10-CM | POA: Diagnosis not present

## 2017-02-12 DIAGNOSIS — I82502 Chronic embolism and thrombosis of unspecified deep veins of left lower extremity: Secondary | ICD-10-CM | POA: Diagnosis not present

## 2017-02-12 DIAGNOSIS — I6350 Cerebral infarction due to unspecified occlusion or stenosis of unspecified cerebral artery: Secondary | ICD-10-CM | POA: Diagnosis not present

## 2017-02-12 DIAGNOSIS — I69354 Hemiplegia and hemiparesis following cerebral infarction affecting left non-dominant side: Secondary | ICD-10-CM | POA: Diagnosis not present

## 2017-02-13 DIAGNOSIS — I82502 Chronic embolism and thrombosis of unspecified deep veins of left lower extremity: Secondary | ICD-10-CM | POA: Diagnosis not present

## 2017-02-13 DIAGNOSIS — E119 Type 2 diabetes mellitus without complications: Secondary | ICD-10-CM | POA: Diagnosis not present

## 2017-02-13 DIAGNOSIS — Z9181 History of falling: Secondary | ICD-10-CM | POA: Diagnosis not present

## 2017-02-13 DIAGNOSIS — I69354 Hemiplegia and hemiparesis following cerebral infarction affecting left non-dominant side: Secondary | ICD-10-CM | POA: Diagnosis not present

## 2017-02-13 DIAGNOSIS — F333 Major depressive disorder, recurrent, severe with psychotic symptoms: Secondary | ICD-10-CM | POA: Diagnosis not present

## 2017-02-13 DIAGNOSIS — I10 Essential (primary) hypertension: Secondary | ICD-10-CM | POA: Diagnosis not present

## 2017-02-14 ENCOUNTER — Ambulatory Visit (INDEPENDENT_AMBULATORY_CARE_PROVIDER_SITE_OTHER): Payer: Medicare Other | Admitting: *Deleted

## 2017-02-14 DIAGNOSIS — Z8679 Personal history of other diseases of the circulatory system: Secondary | ICD-10-CM

## 2017-02-14 DIAGNOSIS — Z9181 History of falling: Secondary | ICD-10-CM | POA: Diagnosis not present

## 2017-02-14 DIAGNOSIS — I82502 Chronic embolism and thrombosis of unspecified deep veins of left lower extremity: Secondary | ICD-10-CM | POA: Diagnosis not present

## 2017-02-14 DIAGNOSIS — E119 Type 2 diabetes mellitus without complications: Secondary | ICD-10-CM | POA: Diagnosis not present

## 2017-02-14 DIAGNOSIS — I69354 Hemiplegia and hemiparesis following cerebral infarction affecting left non-dominant side: Secondary | ICD-10-CM | POA: Diagnosis not present

## 2017-02-14 DIAGNOSIS — F333 Major depressive disorder, recurrent, severe with psychotic symptoms: Secondary | ICD-10-CM | POA: Diagnosis not present

## 2017-02-14 DIAGNOSIS — Z5181 Encounter for therapeutic drug level monitoring: Secondary | ICD-10-CM | POA: Diagnosis not present

## 2017-02-14 DIAGNOSIS — I82409 Acute embolism and thrombosis of unspecified deep veins of unspecified lower extremity: Secondary | ICD-10-CM | POA: Diagnosis not present

## 2017-02-14 DIAGNOSIS — I10 Essential (primary) hypertension: Secondary | ICD-10-CM | POA: Diagnosis not present

## 2017-02-14 LAB — POCT INR: INR: 2.2

## 2017-02-14 NOTE — Patient Instructions (Signed)
Continue coumadin 2.5mg  daily except 5mg  on Sundays, Tuesdays and Thursdays Recheck in 4 weeks

## 2017-02-15 DIAGNOSIS — K5641 Fecal impaction: Secondary | ICD-10-CM | POA: Diagnosis not present

## 2017-02-16 DIAGNOSIS — E119 Type 2 diabetes mellitus without complications: Secondary | ICD-10-CM | POA: Diagnosis not present

## 2017-02-16 DIAGNOSIS — K5641 Fecal impaction: Secondary | ICD-10-CM | POA: Diagnosis not present

## 2017-02-16 DIAGNOSIS — F333 Major depressive disorder, recurrent, severe with psychotic symptoms: Secondary | ICD-10-CM | POA: Diagnosis not present

## 2017-02-16 DIAGNOSIS — I10 Essential (primary) hypertension: Secondary | ICD-10-CM | POA: Diagnosis not present

## 2017-02-16 DIAGNOSIS — I69354 Hemiplegia and hemiparesis following cerebral infarction affecting left non-dominant side: Secondary | ICD-10-CM | POA: Diagnosis not present

## 2017-02-16 DIAGNOSIS — I82502 Chronic embolism and thrombosis of unspecified deep veins of left lower extremity: Secondary | ICD-10-CM | POA: Diagnosis not present

## 2017-02-20 DIAGNOSIS — E119 Type 2 diabetes mellitus without complications: Secondary | ICD-10-CM | POA: Diagnosis not present

## 2017-02-20 DIAGNOSIS — I69354 Hemiplegia and hemiparesis following cerebral infarction affecting left non-dominant side: Secondary | ICD-10-CM | POA: Diagnosis not present

## 2017-02-20 DIAGNOSIS — F333 Major depressive disorder, recurrent, severe with psychotic symptoms: Secondary | ICD-10-CM | POA: Diagnosis not present

## 2017-02-20 DIAGNOSIS — K5641 Fecal impaction: Secondary | ICD-10-CM | POA: Diagnosis not present

## 2017-02-20 DIAGNOSIS — I10 Essential (primary) hypertension: Secondary | ICD-10-CM | POA: Diagnosis not present

## 2017-02-20 DIAGNOSIS — I82502 Chronic embolism and thrombosis of unspecified deep veins of left lower extremity: Secondary | ICD-10-CM | POA: Diagnosis not present

## 2017-02-22 DIAGNOSIS — K5641 Fecal impaction: Secondary | ICD-10-CM | POA: Diagnosis not present

## 2017-02-22 DIAGNOSIS — I69354 Hemiplegia and hemiparesis following cerebral infarction affecting left non-dominant side: Secondary | ICD-10-CM | POA: Diagnosis not present

## 2017-02-22 DIAGNOSIS — I10 Essential (primary) hypertension: Secondary | ICD-10-CM | POA: Diagnosis not present

## 2017-02-22 DIAGNOSIS — E119 Type 2 diabetes mellitus without complications: Secondary | ICD-10-CM | POA: Diagnosis not present

## 2017-02-22 DIAGNOSIS — I82502 Chronic embolism and thrombosis of unspecified deep veins of left lower extremity: Secondary | ICD-10-CM | POA: Diagnosis not present

## 2017-02-22 DIAGNOSIS — F333 Major depressive disorder, recurrent, severe with psychotic symptoms: Secondary | ICD-10-CM | POA: Diagnosis not present

## 2017-02-27 DIAGNOSIS — K5641 Fecal impaction: Secondary | ICD-10-CM | POA: Diagnosis not present

## 2017-02-27 DIAGNOSIS — F333 Major depressive disorder, recurrent, severe with psychotic symptoms: Secondary | ICD-10-CM | POA: Diagnosis not present

## 2017-02-27 DIAGNOSIS — I82502 Chronic embolism and thrombosis of unspecified deep veins of left lower extremity: Secondary | ICD-10-CM | POA: Diagnosis not present

## 2017-02-27 DIAGNOSIS — I10 Essential (primary) hypertension: Secondary | ICD-10-CM | POA: Diagnosis not present

## 2017-02-27 DIAGNOSIS — E119 Type 2 diabetes mellitus without complications: Secondary | ICD-10-CM | POA: Diagnosis not present

## 2017-02-27 DIAGNOSIS — I69354 Hemiplegia and hemiparesis following cerebral infarction affecting left non-dominant side: Secondary | ICD-10-CM | POA: Diagnosis not present

## 2017-02-28 ENCOUNTER — Other Ambulatory Visit (HOSPITAL_COMMUNITY): Payer: Self-pay | Admitting: Internal Medicine

## 2017-02-28 ENCOUNTER — Ambulatory Visit (HOSPITAL_COMMUNITY)
Admission: RE | Admit: 2017-02-28 | Discharge: 2017-02-28 | Disposition: A | Discharge status: Home / Self Care | Payer: Medicare Other | Source: Ambulatory Visit | Attending: Internal Medicine | Admitting: Internal Medicine

## 2017-02-28 DIAGNOSIS — M25461 Effusion, right knee: Secondary | ICD-10-CM | POA: Insufficient documentation

## 2017-02-28 DIAGNOSIS — G8190 Hemiplegia, unspecified affecting unspecified side: Secondary | ICD-10-CM | POA: Diagnosis not present

## 2017-02-28 DIAGNOSIS — M1711 Unilateral primary osteoarthritis, right knee: Secondary | ICD-10-CM | POA: Insufficient documentation

## 2017-02-28 DIAGNOSIS — M25561 Pain in right knee: Secondary | ICD-10-CM | POA: Diagnosis not present

## 2017-02-28 DIAGNOSIS — E1165 Type 2 diabetes mellitus with hyperglycemia: Secondary | ICD-10-CM | POA: Diagnosis not present

## 2017-02-28 DIAGNOSIS — W19XXXA Unspecified fall, initial encounter: Secondary | ICD-10-CM | POA: Insufficient documentation

## 2017-02-28 DIAGNOSIS — M11261 Other chondrocalcinosis, right knee: Secondary | ICD-10-CM | POA: Diagnosis not present

## 2017-02-28 DIAGNOSIS — M5136 Other intervertebral disc degeneration, lumbar region: Secondary | ICD-10-CM | POA: Insufficient documentation

## 2017-02-28 DIAGNOSIS — M545 Low back pain: Secondary | ICD-10-CM | POA: Diagnosis not present

## 2017-03-06 DIAGNOSIS — F333 Major depressive disorder, recurrent, severe with psychotic symptoms: Secondary | ICD-10-CM | POA: Diagnosis not present

## 2017-03-06 DIAGNOSIS — E119 Type 2 diabetes mellitus without complications: Secondary | ICD-10-CM | POA: Diagnosis not present

## 2017-03-06 DIAGNOSIS — I82502 Chronic embolism and thrombosis of unspecified deep veins of left lower extremity: Secondary | ICD-10-CM | POA: Diagnosis not present

## 2017-03-06 DIAGNOSIS — K5641 Fecal impaction: Secondary | ICD-10-CM | POA: Diagnosis not present

## 2017-03-06 DIAGNOSIS — I10 Essential (primary) hypertension: Secondary | ICD-10-CM | POA: Diagnosis not present

## 2017-03-06 DIAGNOSIS — I69354 Hemiplegia and hemiparesis following cerebral infarction affecting left non-dominant side: Secondary | ICD-10-CM | POA: Diagnosis not present

## 2017-03-13 DIAGNOSIS — B351 Tinea unguium: Secondary | ICD-10-CM | POA: Diagnosis not present

## 2017-03-13 DIAGNOSIS — M79674 Pain in right toe(s): Secondary | ICD-10-CM | POA: Diagnosis not present

## 2017-03-13 DIAGNOSIS — M79675 Pain in left toe(s): Secondary | ICD-10-CM | POA: Diagnosis not present

## 2017-03-14 ENCOUNTER — Ambulatory Visit (INDEPENDENT_AMBULATORY_CARE_PROVIDER_SITE_OTHER): Payer: Medicare Other | Admitting: *Deleted

## 2017-03-14 DIAGNOSIS — I82502 Chronic embolism and thrombosis of unspecified deep veins of left lower extremity: Secondary | ICD-10-CM | POA: Diagnosis not present

## 2017-03-14 DIAGNOSIS — Z8679 Personal history of other diseases of the circulatory system: Secondary | ICD-10-CM | POA: Diagnosis not present

## 2017-03-14 DIAGNOSIS — Z5181 Encounter for therapeutic drug level monitoring: Secondary | ICD-10-CM

## 2017-03-14 DIAGNOSIS — E119 Type 2 diabetes mellitus without complications: Secondary | ICD-10-CM | POA: Diagnosis not present

## 2017-03-14 DIAGNOSIS — I82409 Acute embolism and thrombosis of unspecified deep veins of unspecified lower extremity: Secondary | ICD-10-CM

## 2017-03-14 DIAGNOSIS — F333 Major depressive disorder, recurrent, severe with psychotic symptoms: Secondary | ICD-10-CM | POA: Diagnosis not present

## 2017-03-14 DIAGNOSIS — I69354 Hemiplegia and hemiparesis following cerebral infarction affecting left non-dominant side: Secondary | ICD-10-CM | POA: Diagnosis not present

## 2017-03-14 DIAGNOSIS — K5641 Fecal impaction: Secondary | ICD-10-CM | POA: Diagnosis not present

## 2017-03-14 DIAGNOSIS — I10 Essential (primary) hypertension: Secondary | ICD-10-CM | POA: Diagnosis not present

## 2017-03-14 LAB — POCT INR: INR: 2.1

## 2017-03-14 NOTE — Patient Instructions (Signed)
Continue coumadin 2.5mg  daily except 5mg  on Sundays, Tuesdays and Thursdays Recheck in 4 weeks

## 2017-03-15 DIAGNOSIS — I69354 Hemiplegia and hemiparesis following cerebral infarction affecting left non-dominant side: Secondary | ICD-10-CM | POA: Diagnosis not present

## 2017-03-15 DIAGNOSIS — E119 Type 2 diabetes mellitus without complications: Secondary | ICD-10-CM | POA: Diagnosis not present

## 2017-03-15 DIAGNOSIS — F333 Major depressive disorder, recurrent, severe with psychotic symptoms: Secondary | ICD-10-CM | POA: Diagnosis not present

## 2017-03-15 DIAGNOSIS — I10 Essential (primary) hypertension: Secondary | ICD-10-CM | POA: Diagnosis not present

## 2017-03-15 DIAGNOSIS — K5641 Fecal impaction: Secondary | ICD-10-CM | POA: Diagnosis not present

## 2017-03-15 DIAGNOSIS — I82502 Chronic embolism and thrombosis of unspecified deep veins of left lower extremity: Secondary | ICD-10-CM | POA: Diagnosis not present

## 2017-03-20 ENCOUNTER — Encounter: Payer: Self-pay | Admitting: *Deleted

## 2017-03-20 ENCOUNTER — Telehealth: Payer: Self-pay | Admitting: Gastroenterology

## 2017-03-20 DIAGNOSIS — E1159 Type 2 diabetes mellitus with other circulatory complications: Secondary | ICD-10-CM | POA: Diagnosis not present

## 2017-03-20 NOTE — Telephone Encounter (Signed)
RECALL FOR ULTRASOUND 

## 2017-03-20 NOTE — Telephone Encounter (Signed)
Letter mailed

## 2017-03-21 DIAGNOSIS — I82502 Chronic embolism and thrombosis of unspecified deep veins of left lower extremity: Secondary | ICD-10-CM | POA: Diagnosis not present

## 2017-03-21 DIAGNOSIS — E119 Type 2 diabetes mellitus without complications: Secondary | ICD-10-CM | POA: Diagnosis not present

## 2017-03-21 DIAGNOSIS — I69354 Hemiplegia and hemiparesis following cerebral infarction affecting left non-dominant side: Secondary | ICD-10-CM | POA: Diagnosis not present

## 2017-03-21 DIAGNOSIS — F333 Major depressive disorder, recurrent, severe with psychotic symptoms: Secondary | ICD-10-CM | POA: Diagnosis not present

## 2017-03-21 DIAGNOSIS — K5641 Fecal impaction: Secondary | ICD-10-CM | POA: Diagnosis not present

## 2017-03-21 DIAGNOSIS — I10 Essential (primary) hypertension: Secondary | ICD-10-CM | POA: Diagnosis not present

## 2017-03-21 LAB — COMPLETE METABOLIC PANEL WITH GFR
AG Ratio: 1.3 (calc) (ref 1.0–2.5)
ALT: 9 U/L (ref 6–29)
AST: 13 U/L (ref 10–35)
Albumin: 4 g/dL (ref 3.6–5.1)
Alkaline phosphatase (APISO): 81 U/L (ref 33–130)
BUN: 11 mg/dL (ref 7–25)
CO2: 28 mmol/L (ref 20–32)
Calcium: 9.2 mg/dL (ref 8.6–10.4)
Chloride: 102 mmol/L (ref 98–110)
Creat: 0.74 mg/dL (ref 0.60–0.93)
GFR, Est African American: 94 mL/min/{1.73_m2} (ref >=60)
GFR, Est Non African American: 82 mL/min/{1.73_m2} (ref >=60)
Globulin: 3 g/dL (calc) (ref 1.9–3.7)
Glucose, Bld: 128 mg/dL (ref 65–139)
Potassium: 4.1 mmol/L (ref 3.5–5.3)
Sodium: 137 mmol/L (ref 135–146)
Total Bilirubin: 0.4 mg/dL (ref 0.2–1.2)
Total Protein: 7 g/dL (ref 6.1–8.1)

## 2017-03-21 LAB — HEMOGLOBIN A1C
Hgb A1c MFr Bld: 6.5 % of total Hgb — ABNORMAL HIGH (ref <5.7)
Mean Plasma Glucose: 140 (calc)
eAG (mmol/L): 7.7 (calc)

## 2017-03-22 DIAGNOSIS — F333 Major depressive disorder, recurrent, severe with psychotic symptoms: Secondary | ICD-10-CM | POA: Diagnosis not present

## 2017-03-22 DIAGNOSIS — I10 Essential (primary) hypertension: Secondary | ICD-10-CM | POA: Diagnosis not present

## 2017-03-22 DIAGNOSIS — I82502 Chronic embolism and thrombosis of unspecified deep veins of left lower extremity: Secondary | ICD-10-CM | POA: Diagnosis not present

## 2017-03-22 DIAGNOSIS — K5641 Fecal impaction: Secondary | ICD-10-CM | POA: Diagnosis not present

## 2017-03-22 DIAGNOSIS — I69354 Hemiplegia and hemiparesis following cerebral infarction affecting left non-dominant side: Secondary | ICD-10-CM | POA: Diagnosis not present

## 2017-03-22 DIAGNOSIS — E119 Type 2 diabetes mellitus without complications: Secondary | ICD-10-CM | POA: Diagnosis not present

## 2017-03-23 DIAGNOSIS — K5641 Fecal impaction: Secondary | ICD-10-CM | POA: Diagnosis not present

## 2017-03-23 DIAGNOSIS — F333 Major depressive disorder, recurrent, severe with psychotic symptoms: Secondary | ICD-10-CM | POA: Diagnosis not present

## 2017-03-23 DIAGNOSIS — E119 Type 2 diabetes mellitus without complications: Secondary | ICD-10-CM | POA: Diagnosis not present

## 2017-03-23 DIAGNOSIS — I69354 Hemiplegia and hemiparesis following cerebral infarction affecting left non-dominant side: Secondary | ICD-10-CM | POA: Diagnosis not present

## 2017-03-23 DIAGNOSIS — I10 Essential (primary) hypertension: Secondary | ICD-10-CM | POA: Diagnosis not present

## 2017-03-23 DIAGNOSIS — I82502 Chronic embolism and thrombosis of unspecified deep veins of left lower extremity: Secondary | ICD-10-CM | POA: Diagnosis not present

## 2017-03-27 DIAGNOSIS — I82502 Chronic embolism and thrombosis of unspecified deep veins of left lower extremity: Secondary | ICD-10-CM | POA: Diagnosis not present

## 2017-03-27 DIAGNOSIS — I10 Essential (primary) hypertension: Secondary | ICD-10-CM | POA: Diagnosis not present

## 2017-03-27 DIAGNOSIS — K5641 Fecal impaction: Secondary | ICD-10-CM | POA: Diagnosis not present

## 2017-03-27 DIAGNOSIS — F333 Major depressive disorder, recurrent, severe with psychotic symptoms: Secondary | ICD-10-CM | POA: Diagnosis not present

## 2017-03-27 DIAGNOSIS — E119 Type 2 diabetes mellitus without complications: Secondary | ICD-10-CM | POA: Diagnosis not present

## 2017-03-27 DIAGNOSIS — I69354 Hemiplegia and hemiparesis following cerebral infarction affecting left non-dominant side: Secondary | ICD-10-CM | POA: Diagnosis not present

## 2017-03-28 ENCOUNTER — Ambulatory Visit (INDEPENDENT_AMBULATORY_CARE_PROVIDER_SITE_OTHER): Payer: Medicare Other | Admitting: "Endocrinology

## 2017-03-28 ENCOUNTER — Emergency Department (HOSPITAL_COMMUNITY): Payer: Medicare Other

## 2017-03-28 ENCOUNTER — Observation Stay (HOSPITAL_COMMUNITY)
Admission: EM | Admit: 2017-03-28 | Discharge: 2017-03-30 | Disposition: A | Discharge status: Home / Self Care | Payer: Medicare Other | Attending: Family Medicine | Admitting: Family Medicine

## 2017-03-28 ENCOUNTER — Other Ambulatory Visit: Payer: Self-pay

## 2017-03-28 ENCOUNTER — Encounter: Payer: Self-pay | Admitting: "Endocrinology

## 2017-03-28 ENCOUNTER — Encounter (HOSPITAL_COMMUNITY): Payer: Self-pay

## 2017-03-28 VITALS — BP 70/38 | HR 77

## 2017-03-28 DIAGNOSIS — E1151 Type 2 diabetes mellitus with diabetic peripheral angiopathy without gangrene: Secondary | ICD-10-CM | POA: Insufficient documentation

## 2017-03-28 DIAGNOSIS — Z7901 Long term (current) use of anticoagulants: Secondary | ICD-10-CM | POA: Insufficient documentation

## 2017-03-28 DIAGNOSIS — I10 Essential (primary) hypertension: Secondary | ICD-10-CM | POA: Diagnosis not present

## 2017-03-28 DIAGNOSIS — G8194 Hemiplegia, unspecified affecting left nondominant side: Secondary | ICD-10-CM | POA: Diagnosis present

## 2017-03-28 DIAGNOSIS — R10817 Generalized abdominal tenderness: Secondary | ICD-10-CM | POA: Diagnosis not present

## 2017-03-28 DIAGNOSIS — Z79899 Other long term (current) drug therapy: Secondary | ICD-10-CM | POA: Diagnosis not present

## 2017-03-28 DIAGNOSIS — F209 Schizophrenia, unspecified: Secondary | ICD-10-CM | POA: Diagnosis not present

## 2017-03-28 DIAGNOSIS — Z8673 Personal history of transient ischemic attack (TIA), and cerebral infarction without residual deficits: Secondary | ICD-10-CM | POA: Insufficient documentation

## 2017-03-28 DIAGNOSIS — I6930 Unspecified sequelae of cerebral infarction: Secondary | ICD-10-CM | POA: Diagnosis not present

## 2017-03-28 DIAGNOSIS — I82409 Acute embolism and thrombosis of unspecified deep veins of unspecified lower extremity: Secondary | ICD-10-CM | POA: Diagnosis not present

## 2017-03-28 DIAGNOSIS — I959 Hypotension, unspecified: Secondary | ICD-10-CM

## 2017-03-28 DIAGNOSIS — I69354 Hemiplegia and hemiparesis following cerebral infarction affecting left non-dominant side: Secondary | ICD-10-CM | POA: Insufficient documentation

## 2017-03-28 DIAGNOSIS — E119 Type 2 diabetes mellitus without complications: Secondary | ICD-10-CM | POA: Diagnosis present

## 2017-03-28 DIAGNOSIS — I951 Orthostatic hypotension: Principal | ICD-10-CM | POA: Diagnosis present

## 2017-03-28 DIAGNOSIS — Z86718 Personal history of other venous thrombosis and embolism: Secondary | ICD-10-CM | POA: Diagnosis not present

## 2017-03-28 DIAGNOSIS — R531 Weakness: Secondary | ICD-10-CM

## 2017-03-28 DIAGNOSIS — E1159 Type 2 diabetes mellitus with other circulatory complications: Secondary | ICD-10-CM | POA: Diagnosis present

## 2017-03-28 DIAGNOSIS — Z87891 Personal history of nicotine dependence: Secondary | ICD-10-CM | POA: Insufficient documentation

## 2017-03-28 DIAGNOSIS — R1084 Generalized abdominal pain: Secondary | ICD-10-CM | POA: Diagnosis not present

## 2017-03-28 DIAGNOSIS — E1165 Type 2 diabetes mellitus with hyperglycemia: Secondary | ICD-10-CM | POA: Diagnosis present

## 2017-03-28 DIAGNOSIS — Z794 Long term (current) use of insulin: Secondary | ICD-10-CM | POA: Insufficient documentation

## 2017-03-28 DIAGNOSIS — E782 Mixed hyperlipidemia: Secondary | ICD-10-CM | POA: Diagnosis not present

## 2017-03-28 LAB — CBC WITH DIFFERENTIAL/PLATELET
Basophils Absolute: 0 10*3/uL (ref 0.0–0.1)
Basophils Relative: 0 %
Eosinophils Absolute: 0.1 10*3/uL (ref 0.0–0.7)
Eosinophils Relative: 2 %
HCT: 41.9 % (ref 36.0–46.0)
Hemoglobin: 13.6 g/dL (ref 12.0–15.0)
Lymphocytes Relative: 31 %
Lymphs Abs: 1.8 10*3/uL (ref 0.7–4.0)
MCH: 29.4 pg (ref 26.0–34.0)
MCHC: 32.5 g/dL (ref 30.0–36.0)
MCV: 90.7 fL (ref 78.0–100.0)
Monocytes Absolute: 0.5 10*3/uL (ref 0.1–1.0)
Monocytes Relative: 8 %
Neutro Abs: 3.5 10*3/uL (ref 1.7–7.7)
Neutrophils Relative %: 59 %
Platelets: 241 10*3/uL (ref 150–400)
RBC: 4.62 MIL/uL (ref 3.87–5.11)
RDW: 12.9 % (ref 11.5–15.5)
WBC: 5.9 10*3/uL (ref 4.0–10.5)

## 2017-03-28 LAB — LIPASE, BLOOD: Lipase: 20 U/L (ref 11–51)

## 2017-03-28 LAB — PROTIME-INR
INR: 1.74
Prothrombin Time: 20.2 seconds — ABNORMAL HIGH (ref 11.4–15.2)

## 2017-03-28 LAB — GLUCOSE, CAPILLARY: Glucose-Capillary: 142 mg/dL — ABNORMAL HIGH (ref 65–99)

## 2017-03-28 LAB — URINALYSIS, ROUTINE W REFLEX MICROSCOPIC
Bilirubin Urine: NEGATIVE
Glucose, UA: NEGATIVE mg/dL
Hgb urine dipstick: NEGATIVE
Ketones, ur: NEGATIVE mg/dL
Nitrite: NEGATIVE
Protein, ur: NEGATIVE mg/dL
Specific Gravity, Urine: 1.01 (ref 1.005–1.030)
pH: 6 (ref 5.0–8.0)

## 2017-03-28 LAB — MRSA PCR SCREENING: MRSA by PCR: NEGATIVE

## 2017-03-28 LAB — COMPREHENSIVE METABOLIC PANEL
ALT: 12 U/L — ABNORMAL LOW (ref 14–54)
AST: 14 U/L — ABNORMAL LOW (ref 15–41)
Albumin: 3.9 g/dL (ref 3.5–5.0)
Alkaline Phosphatase: 81 U/L (ref 38–126)
Anion gap: 10 (ref 5–15)
BUN: 14 mg/dL (ref 6–20)
CO2: 28 mmol/L (ref 22–32)
Calcium: 9 mg/dL (ref 8.9–10.3)
Chloride: 99 mmol/L — ABNORMAL LOW (ref 101–111)
Creatinine, Ser: 0.74 mg/dL (ref 0.44–1.00)
GFR calc Af Amer: >60 mL/min (ref >60)
GFR calc non Af Amer: >60 mL/min (ref >60)
Glucose, Bld: 144 mg/dL — ABNORMAL HIGH (ref 65–99)
Potassium: 3.8 mmol/L (ref 3.5–5.1)
Sodium: 137 mmol/L (ref 135–145)
Total Bilirubin: 0.5 mg/dL (ref 0.3–1.2)
Total Protein: 7.3 g/dL (ref 6.5–8.1)

## 2017-03-28 LAB — CBG MONITORING, ED: Glucose-Capillary: 157 mg/dL — ABNORMAL HIGH (ref 65–99)

## 2017-03-28 LAB — LACTIC ACID, PLASMA
Lactic Acid, Venous: 1.3 mmol/L (ref 0.5–1.9)
Lactic Acid, Venous: 1.5 mmol/L (ref 0.5–1.9)

## 2017-03-28 LAB — SAMPLE TO BLOOD BANK

## 2017-03-28 LAB — POC OCCULT BLOOD, ED: Fecal Occult Bld: NEGATIVE

## 2017-03-28 LAB — TROPONIN I: Troponin I: <0.03 ng/mL (ref <0.03)

## 2017-03-28 MED ORDER — ONDANSETRON HCL 4 MG/2ML IJ SOLN: 4.0000 mg | Freq: Four times a day (QID) | INTRAMUSCULAR | Status: DC | PRN

## 2017-03-28 MED ORDER — ACETAMINOPHEN 650 MG RE SUPP: 650.0000 mg | Freq: Four times a day (QID) | RECTAL | Status: DC | PRN

## 2017-03-28 MED ORDER — PANTOPRAZOLE SODIUM 40 MG PO TBEC
40.0000 mg | DELAYED_RELEASE_TABLET | Freq: Every day | ORAL | Status: DC
  Administered 2017-03-29 – 2017-03-30 (×2): 40 mg via ORAL
  Filled 2017-03-28 (×2): qty 1

## 2017-03-28 MED ORDER — INSULIN DETEMIR 100 UNIT/ML ~~LOC~~ SOLN
30.0000 [IU] | Freq: Every day | SUBCUTANEOUS | Status: DC
  Administered 2017-03-28 – 2017-03-29 (×2): 30 [IU] via SUBCUTANEOUS
  Filled 2017-03-28 (×5): qty 0.3

## 2017-03-28 MED ORDER — INSULIN ASPART 100 UNIT/ML ~~LOC~~ SOLN
0.0000 [IU] | Freq: Three times a day (TID) | SUBCUTANEOUS | Status: DC
  Administered 2017-03-28: 2 [IU] via SUBCUTANEOUS
  Administered 2017-03-29: 1 [IU] via SUBCUTANEOUS
  Administered 2017-03-29: 2 [IU] via SUBCUTANEOUS
  Administered 2017-03-29 – 2017-03-30 (×3): 1 [IU] via SUBCUTANEOUS

## 2017-03-28 MED ORDER — ARIPIPRAZOLE 2 MG PO TABS
2.0000 mg | ORAL_TABLET | Freq: Every day | ORAL | Status: DC
  Administered 2017-03-29 – 2017-03-30 (×2): 2 mg via ORAL
  Filled 2017-03-28 (×5): qty 1

## 2017-03-28 MED ORDER — ONDANSETRON HCL 4 MG PO TABS: 4.0000 mg | ORAL_TABLET | Freq: Four times a day (QID) | ORAL | Status: DC | PRN

## 2017-03-28 MED ORDER — DARIFENACIN HYDROBROMIDE ER 7.5 MG PO TB24
7.5000 mg | ORAL_TABLET | Freq: Every day | ORAL | Status: DC
  Administered 2017-03-29 – 2017-03-30 (×2): 7.5 mg via ORAL
  Filled 2017-03-28 (×2): qty 1

## 2017-03-28 MED ORDER — ZOLPIDEM TARTRATE 5 MG PO TABS
5.0000 mg | ORAL_TABLET | Freq: Every day | ORAL | Status: DC
  Administered 2017-03-28 – 2017-03-29 (×2): 5 mg via ORAL
  Filled 2017-03-28 (×2): qty 1

## 2017-03-28 MED ORDER — RISPERIDONE 1 MG PO TBDP
3.0000 mg | ORAL_TABLET | Freq: Two times a day (BID) | ORAL | Status: DC
  Administered 2017-03-28 – 2017-03-30 (×4): 3 mg via ORAL
  Filled 2017-03-28 (×10): qty 3

## 2017-03-28 MED ORDER — SODIUM CHLORIDE 0.9 % IV SOLN: INTRAVENOUS | Status: DC

## 2017-03-28 MED ORDER — IOPAMIDOL (ISOVUE-300) INJECTION 61%
100.0000 mL | Freq: Once | INTRAVENOUS | Status: AC | PRN
  Administered 2017-03-28: 100 mL via INTRAVENOUS

## 2017-03-28 MED ORDER — MIRTAZAPINE 30 MG PO TABS
30.0000 mg | ORAL_TABLET | Freq: Every day | ORAL | Status: DC
  Administered 2017-03-28 – 2017-03-29 (×2): 30 mg via ORAL
  Filled 2017-03-28 (×2): qty 1

## 2017-03-28 MED ORDER — WARFARIN - PHARMACIST DOSING INPATIENT
Status: DC
  Administered 2017-03-29: 16:00:00

## 2017-03-28 MED ORDER — DULOXETINE HCL 60 MG PO CPEP
120.0000 mg | ORAL_CAPSULE | Freq: Every day | ORAL | Status: DC
  Administered 2017-03-29 – 2017-03-30 (×2): 120 mg via ORAL
  Filled 2017-03-28 (×2): qty 2

## 2017-03-28 MED ORDER — LATANOPROST 0.005 % OP SOLN
1.0000 [drp] | Freq: Every day | OPHTHALMIC | Status: DC
  Administered 2017-03-28 – 2017-03-29 (×2): 1 [drp] via OPHTHALMIC
  Filled 2017-03-28 (×2): qty 2.5

## 2017-03-28 MED ORDER — CLONAZEPAM 0.5 MG PO TABS
0.5000 mg | ORAL_TABLET | Freq: Four times a day (QID) | ORAL | Status: DC
  Administered 2017-03-28 – 2017-03-30 (×8): 0.5 mg via ORAL
  Filled 2017-03-28 (×8): qty 1

## 2017-03-28 MED ORDER — POTASSIUM CHLORIDE IN NACL 20-0.9 MEQ/L-% IV SOLN
INTRAVENOUS | Status: AC
  Administered 2017-03-28: 18:00:00 via INTRAVENOUS

## 2017-03-28 MED ORDER — SIMVASTATIN 20 MG PO TABS
20.0000 mg | ORAL_TABLET | Freq: Every day | ORAL | Status: DC
  Administered 2017-03-28 – 2017-03-29 (×2): 20 mg via ORAL
  Filled 2017-03-28 (×2): qty 1

## 2017-03-28 MED ORDER — POLYVINYL ALCOHOL 1.4 % OP SOLN
1.0000 [drp] | Freq: Three times a day (TID) | OPHTHALMIC | Status: DC
  Administered 2017-03-28 – 2017-03-30 (×5): 1 [drp] via OPHTHALMIC
  Filled 2017-03-28 (×4): qty 15

## 2017-03-28 MED ORDER — TIZANIDINE HCL 2 MG PO TABS
2.0000 mg | ORAL_TABLET | Freq: Every day | ORAL | Status: DC
  Administered 2017-03-29 – 2017-03-30 (×2): 2 mg via ORAL
  Filled 2017-03-28 (×5): qty 1

## 2017-03-28 MED ORDER — HYDROCODONE-ACETAMINOPHEN 5-325 MG PO TABS
1.0000 | ORAL_TABLET | Freq: Three times a day (TID) | ORAL | Status: DC
  Administered 2017-03-28 – 2017-03-30 (×6): 1 via ORAL
  Filled 2017-03-28 (×6): qty 1

## 2017-03-28 MED ORDER — INSULIN ASPART 100 UNIT/ML ~~LOC~~ SOLN: 0.0000 [IU] | Freq: Every day | SUBCUTANEOUS | Status: DC

## 2017-03-28 MED ORDER — WARFARIN SODIUM 5 MG PO TABS
5.0000 mg | ORAL_TABLET | Freq: Once | ORAL | Status: AC
  Administered 2017-03-28: 5 mg via ORAL
  Filled 2017-03-28: qty 1

## 2017-03-28 MED ORDER — CALCIUM CARBONATE ANTACID 500 MG PO CHEW: 1.0000 | CHEWABLE_TABLET | Freq: Four times a day (QID) | ORAL | Status: DC | PRN

## 2017-03-28 MED ORDER — ACETAMINOPHEN 325 MG PO TABS
650.0000 mg | ORAL_TABLET | Freq: Four times a day (QID) | ORAL | Status: DC | PRN
  Administered 2017-03-28 – 2017-03-30 (×2): 650 mg via ORAL
  Filled 2017-03-28 (×2): qty 2

## 2017-03-28 NOTE — Progress Notes (Signed)
Subjective:    Patient ID: [name], female    DOB: 1945/05/08,    Past Medical History:  Diagnosis Date  . Anxiety   . Arthritis   . Depression    History of psychosis and previous suicide attempt  . DVT, lower extremity, recurrent (HCC)    Long-term Coumadin per Dr. Legrand Rams  . Essential hypertension   . GERD (gastroesophageal reflux disease)   . Hemiplegia ([place]) 2010   Left side  . History of stroke    Acute infarct and right cerebral white matter small vessel disease 12/10  . Leg DVT (deep venous thromboembolism), acute ([place]) 2006  . Schizophrenia ([place])   . Stroke [facility])    left sided weakness  . Type 2 diabetes mellitus ([place])    Past Surgical History:  Procedure Laterality Date  . BACK SURGERY    . BIOPSY N/A 11/24/2014   Procedure: BIOPSY;  Surgeon: Danie Binder, MD;  Location: [facility] ORS;  Service: Endoscopy;  Laterality: N/A;  . BIOPSY  05/17/2016   Procedure: BIOPSY;  Surgeon: Danie Binder, MD;  Location: [facility] END[facility] SUITE;  Service: Endoscopy;;  gastric biopsy  . COLONOSCOPY WITH PROPOFOL N/A 11/24/2014   Dr. Rudie Meyer polyps removed/moderate sized internal hemorrhoids, tubular adenomas. Next surveillance in 3 years  . ESOPHAGOGASTRODUODENOSCOPY (EGD) WITH PROPOFOL N/A 11/24/2014   Dr. Clayburn Pert HH/patent stricture at the gastroesophageal junction, mild non-erosive gastritis, path negative for H.pylori or celiac sprue  . ESOPHAGOGASTRODUODENOSCOPY (EGD) WITH PROPOFOL N/A 05/17/2016   Procedure: ESOPHAGOGASTRODUODENOSCOPY (EGD) WITH PROPOFOL;  Surgeon: Danie Binder, MD;  Location: [facility];  Service: Endoscopy;  Laterality: N/A;  12:45pm  . GIVENS CAPSULE STUDY N/A 12/11/2014   MULTILPLE EROSION IN the stomach WITH ACTIVE OOZING. OCCASIONAL EROSIONS AND RARE ULCER SEEN IN PROXIMAL SMALL BOWEL . No masses or AVMs SEEN. NO OLD BLOOD OR FRESH BLOOD SEEN.   . POLYPECTOMY N/A 11/24/2014   Procedure: POLYPECTOMY;  Surgeon: Danie Binder, MD;  Location: [facility] ORS;   Service: Endoscopy;  Laterality: N/A;  . SAVORY DILATION N/A 05/17/2016   Procedure: SAVORY DILATION;  Surgeon: Danie Binder, MD;  Location: [facility];  Service: Endoscopy;  Laterality: N/A;   Social History   Socioeconomic History  . Marital status: Widowed    Spouse name: None  . Number of children: None  . Years of education: None  . Highest education level: None  Social Needs  . Financial resource strain: None  . Food insecurity - worry: None  . Food insecurity - inability: None  . Transportation needs - medical: None  . Transportation needs - non-medical: None  Occupational History  . None  Tobacco Use  . Smoking status: Former Smoker    Packs/day: 0.25    Years: 20.00    Pack years: 5.00    Types: Cigarettes    Last attempt to quit: 01/24/1995    Years since quitting: 22.1  . Smokeless tobacco: Never Used  Substance and Sexual Activity  . Alcohol use: No    Alcohol/week: 0.0 oz  . Drug use: No  . Sexual activity: No    Birth control/protection: Post-menopausal  Other Topics Concern  . None  Social History Narrative  . None   Outpatient Encounter Medications as of 03/28/2017  Medication Sig  . ARIPiprazole (ABILIFY) 2 MG tablet Take 2 mg by mouth daily.   . clonazePAM (KLONOPIN) 0.5 MG tablet Take 1 tablet (0.5 mg total) by mouth 4 (four)  times daily.  . DULoxetine (CYMBALTA) 60 MG capsule Take 120 mg by mouth daily.   Marland Kitchen HYDROcodone-acetaminophen (NORCO/VICODIN) 5-325 MG tablet Take 1 tablet by mouth 3 (three) times daily.  . Insulin Aspart (NOVOLOG FLEXPEN [place]) Inject 15-21 Units into the skin 3 (three) times daily before meals.  . iron polysaccharides (NIFEREX) 150 MG capsule Take 1 capsule (150 mg total) by mouth daily.  Marland Kitchen latanoprost (XALATAN) 0.005 % ophthalmic solution Place 1 drop into both eyes at bedtime.  Marland Kitchen LEVEMIR FLEXTOUCH 100 UNIT/ML Pen INJECT 50 UNITS SUBCUTANEOUSLY AT BEDTIME.  Marland Kitchen loperamide (IMODIUM) 2 MG capsule Take 4 mg by mouth daily as  needed for diarrhea or loose stools. Take 2 capsules initially then take 2 after each loose stool per MAR  . metoCLOPramide (REGLAN) 5 MG tablet Take 1 tablet (5 mg total) by mouth every 8 (eight) hours as needed for nausea or vomiting (or bloating).  . metoprolol succinate (TOPROL-XL) 25 MG 24 hr tablet Take 12.5 mg by mouth 2 (two) times daily.   . mirtazapine (REMERON) 30 MG tablet Take 30 mg by mouth at bedtime.    Marland Kitchen NOVOLOG FLEXPEN 100 UNIT/ML FlexPen INJECT 15 UNITS SUBCUTANEOUSLY BEFORE MEALS IF BS IS BETWEEN 90-150.  Marland Kitchen omeprazole (PRILOSEC) 40 MG capsule Take 40 mg by mouth daily.  . polyethylene glycol powder (GLYCOLAX/MIRALAX) powder Take 17 g by mouth daily as needed.  . promethazine (PHENERGAN) 12.5 MG tablet TAKE 1 TABLET BY MOUTH EVERY 6 HOURS AS NEEDED FOR NAUSEA OR VOMITING.  . risperiDONE (RISPERDAL) 3 MG tablet Take 3 mg by mouth 2 (two) times daily.   . simvastatin (ZOCOR) 20 MG tablet Take 20 mg by mouth at bedtime.   . solifenacin (VESICARE) 5 MG tablet Take 5 mg by mouth daily.  Marland Kitchen tiZANidine (ZANAFLEX) 2 MG tablet Take 2 mg by mouth daily.  . Vitamin D, Ergocalciferol, (DRISDOL) 50000 UNITS CAPS capsule Take 50,000 Units by mouth every Friday.   . warfarin (COUMADIN) 5 MG tablet TAKE 1/2 TABLET (2.5mg ) BY MOUTH ONCE DAILY ON MON,WED,FRI & SATURDAY. TAKE 1 TABLET (5mg ) ON SUNDAY, TUES,& THURS.  Marland Kitchen zolpidem (AMBIEN) 10 MG tablet Take 1 tablet (10 mg total) by mouth at bedtime.  . [DISCONTINUED] solifenacin (VESICARE) 5 MG tablet Take 5 mg by mouth daily.  Marland Kitchen acetaminophen (TYLENOL) 325 MG tablet Take 650 mg by mouth every 6 (six) hours as needed. For fever > 101   . calcium carbonate (TUMS - DOSED IN MG ELEMENTAL CALCIUM) 500 MG chewable tablet Chew 1 tablet by mouth 4 (four) times daily as needed for indigestion or heartburn.   . naloxegol oxalate (MOVANTIK) 12.5 MG TABS tablet Take 12.5 mg by mouth daily.  Marland Kitchen NOVOFINE AUTOCOVER 30G X 8 MM MISC   . ondansetron (ZOFRAN) 4 MG  tablet Take 1 tablet (4 mg total) by mouth 4 (four) times daily -  before meals and at bedtime.  . polyvinyl alcohol (ARTIFICIAL TEARS) 1.4 % ophthalmic solution Place 1 drop into both eyes 3 (three) times daily.   No facility-administered encounter medications on file as of 03/28/2017.    ALLERGIES: Allergies  Allergen Reactions  . Sulfa Antibiotics Rash  . Sulfonamide Derivatives Rash    REACTION: rash   VACCINATION STATUS: There is no immunization history for the selected administration types on file for this patient.  Diabetes  She presents for her follow-up diabetic visit. She has type 2 diabetes mellitus. Onset time: She was diagnosed at approximate age of 19  years. Her disease course has been improving. There are no hypoglycemic associated symptoms. Pertinent negatives for hypoglycemia include no confusion, pallor or seizures. Pertinent negatives for diabetes include no polydipsia, no polyphagia and no polyuria. There are no hypoglycemic complications. Symptoms are stable. Diabetic complications include a CVA. Risk factors for coronary artery disease include dyslipidemia, diabetes mellitus, obesity, sedentary lifestyle and hypertension. Current diabetic treatment includes intensive insulin program and oral agent (monotherapy) (She kept requiring large dose of insulin due to consumption of large quantities of processed carbohydrates.Marland Kitchen). Her weight is decreasing steadily. She is following a generally unhealthy diet. When asked about meal planning, she reported none. She never participates in exercise. Her home blood glucose trend is decreasing steadily. Her breakfast blood glucose range is generally 130-140 mg/dl. Her lunch blood glucose range is generally 130-140 mg/dl. Her dinner blood glucose range is generally 130-140 mg/dl. Her bedtime blood glucose range is generally 130-140 mg/dl. Her overall blood glucose range is 130-140 mg/dl. An ACE inhibitor/angiotensin II receptor blocker is being  taken.  Hypertension  This is a chronic problem. The current episode started more than 1 year ago. The problem is controlled. Pertinent negatives include no palpitations or shortness of breath. Risk factors for coronary artery disease include dyslipidemia and diabetes mellitus. Hypertensive end-organ damage includes CVA.  Hyperlipidemia  This is a chronic problem. The current episode started more than 1 year ago. Exacerbating diseases include diabetes. Pertinent negatives include no shortness of breath. Risk factors for coronary artery disease include diabetes mellitus, dyslipidemia, hypertension, obesity and a sedentary lifestyle.    Review of Systems  Constitutional: Negative for unexpected weight change.       Uses walker to get around.   HENT: Negative for trouble swallowing and voice change.   Eyes: Negative for visual disturbance.  Respiratory: Negative for shortness of breath and wheezing.   Cardiovascular: Negative for palpitations and leg swelling.  Gastrointestinal: Negative for diarrhea.  Endocrine: Negative for cold intolerance, heat intolerance, polydipsia, polyphagia and polyuria.  Skin: Negative for color change, pallor and wound.  Neurological: Negative for seizures.  Psychiatric/Behavioral: Negative for confusion and suicidal ideas.    Objective:    BP (!) 70/38   Pulse 77   Wt Readings from Last 3 Encounters:  03/28/17 158 lb (71.7 kg)  10/06/16 164 lb 8 oz (74.6 kg)  05/23/16 159 lb (72.1 kg)    Physical Exam  Constitutional: She is oriented to person, place, and time.  She was found to be lethargic this morning with a series of low blood pressure readings 83/41, 89/40, and 70/38 with pulse rate of 77.  HENT:  Head: Normocephalic and atraumatic.  She is a mouth breather, has dry mucous membranes.  Eyes: EOM are normal.  Neck: Normal range of motion. Neck supple. No tracheal deviation present. No thyromegaly present.  Cardiovascular: Normal rate and regular  rhythm.  Pulmonary/Chest: Effort normal and breath sounds normal.  Abdominal: Soft. Bowel sounds are normal. There is no tenderness. There is no guarding.  Musculoskeletal: Normal range of motion. She exhibits no edema.  Neurological: She is oriented to person, place, and time.  She was lethargic, however oriented to time, place, and person.  She is on a wheelchair.    Skin: Skin is warm and dry. No rash noted. No erythema. No pallor.  Psychiatric: She has a normal mood and affect. Judgment normal.    Results for orders placed or performed during the hospital encounter of 03/28/17  Protime-INR  Result Value  Ref Range   Prothrombin Time 20.2 (H) 11.4 - 15.2 seconds   INR 1.74   Urinalysis, Routine w reflex microscopic  Result Value Ref Range   Color, Urine YELLOW YELLOW   APPearance CLEAR CLEAR   Specific Gravity, Urine 1.010 1.005 - 1.030   pH 6.0 5.0 - 8.0   Glucose, UA NEGATIVE NEGATIVE mg/dL   Hgb urine dipstick NEGATIVE NEGATIVE   Bilirubin Urine NEGATIVE NEGATIVE   Ketones, ur NEGATIVE NEGATIVE mg/dL   Protein, ur NEGATIVE NEGATIVE mg/dL   Nitrite NEGATIVE NEGATIVE   Leukocytes, UA SMALL (A) NEGATIVE   RBC / HPF 0-5 0 - 5 RBC/hpf   WBC, UA 6-30 0 - 5 WBC/hpf   Bacteria, UA RARE (A) NONE SEEN   Squamous Epithelial / LPF 6-30 (A) NONE SEEN   Hyaline Casts, UA PRESENT    Non Squamous Epithelial 0-5 (A) NONE SEEN  Comprehensive metabolic panel  Result Value Ref Range   Sodium 137 135 - 145 mmol/L   Potassium 3.8 3.5 - 5.1 mmol/L   Chloride 99 (L) 101 - 111 mmol/L   CO2 28 22 - 32 mmol/L   Glucose, Bld 144 (H) 65 - 99 mg/dL   BUN 14 6 - 20 mg/dL   Creatinine, Ser 0.74 0.44 - 1.00 mg/dL   Calcium 9.0 8.9 - 10.3 mg/dL   Total Protein 7.3 6.5 - 8.1 g/dL   Albumin 3.9 3.5 - 5.0 g/dL   AST 14 (L) 15 - 41 U/L   ALT 12 (L) 14 - 54 U/L   Alkaline Phosphatase 81 38 - 126 U/L   Total Bilirubin 0.5 0.3 - 1.2 mg/dL   GFR calc non Af Amer >60 >60 mL/min   GFR calc Af Amer >60  >60 mL/min   Anion gap 10 5 - 15  Lipase, blood  Result Value Ref Range   Lipase 20 11 - 51 U/L  Troponin I  Result Value Ref Range   Troponin I <0.03 <0.03 ng/mL  Lactic acid, plasma  Result Value Ref Range   Lactic Acid, Venous 1.3 0.5 - 1.9 mmol/L  CBC with Differential  Result Value Ref Range   WBC 5.9 4.0 - 10.5 K/uL   RBC 4.62 3.87 - 5.11 MIL/uL   Hemoglobin 13.6 12.0 - 15.0 g/dL   HCT 41.9 36.0 - 46.0 %   MCV 90.7 78.0 - 100.0 fL   MCH 29.4 26.0 - 34.0 pg   MCHC 32.5 30.0 - 36.0 g/dL   RDW 12.9 11.5 - 15.5 %   Platelets 241 150 - 400 K/uL   Neutrophils Relative % 59 %   Neutro Abs 3.5 1.7 - 7.7 K/uL   Lymphocytes Relative 31 %   Lymphs Abs 1.8 0.7 - 4.0 K/uL   Monocytes Relative 8 %   Monocytes Absolute 0.5 0.1 - 1.0 K/uL   Eosinophils Relative 2 %   Eosinophils Absolute 0.1 0.0 - 0.7 K/uL   Basophils Relative 0 %   Basophils Absolute 0.0 0.0 - 0.1 K/uL  POC occult blood, ED  Result Value Ref Range   Fecal Occult Bld NEGATIVE NEGATIVE  Sample to Blood Bank  Result Value Ref Range   Blood Bank Specimen SAMPLE AVAILABLE FOR TESTING    Sample Expiration      03/31/2017 Performed at [facility], [address] [place], [place] [address]    Complete Blood Count (Most recent): Lab Results  Component Value Date   WBC 5.9 03/28/2017   HGB 13.6  03/28/2017   HCT 41.9 03/28/2017   MCV 90.7 03/28/2017   PLT 241 03/28/2017   Chemistry (most recent): Lab Results  Component Value Date   NA 137 03/28/2017   K 3.8 03/28/2017   CL 99 (L) 03/28/2017   CO2 28 03/28/2017   BUN 14 03/28/2017   CREATININE 0.74 03/28/2017   Diabetic Labs (most recent): Lab Results  Component Value Date   HGBA1C 6.5 (H) 03/20/2017   HGBA1C 6.6 (H) 12/18/2016   HGBA1C 6.7 (H) 10/07/2016   Lipid Panel     Component Value Date/Time   CHOL 133 10/07/2016 0620   TRIG 252 (H) 10/07/2016 0620   HDL 28 (L) 10/07/2016 0620   CHOLHDL 4.8 10/07/2016 0620   VLDL 50 (H) 10/07/2016 0620    LDLCALC 55 10/07/2016 0620     Assessment & Plan:   1. Type 2 diabetes mellitus with vascular disease ([place]) Her diabetes is  complicated by recurrent CVA. Patient came with improved glucose profile,  Did have some random hypoglycemia due to poor appetite. A1c remains controlled at 6.5%,  improving from 8.2%.   Recent labs reviewed. - Patient remains at a high risk for more acute and chronic complications of diabetes which include CAD, CVA, CKD, retinopathy, and neuropathy. These are all discussed in detail with the patient.  - I have re-counseled the patient on diet management and weight loss  by adopting a carbohydrate restricted / protein rich  Diet.  - Patient is advised to stick to a routine mealtimes to eat 3 meals  a day and avoid unnecessary snacks ( to snack only to correct hypoglycemia).  - I have approached patient with the following individualized plan to manage diabetes and patient agrees.  -She was doing very well in terms of her diabetes with A1c at 6.5%.   -I advised her caretaker to continue Levemir at 50  units  daily at bedtime, lower NovoLog to 10 units 3 times a day before meals for premeal blood glucose readings of 90-150mg /dl, plus patient specific sliding scale insulin for correction of unexpected hyperglycemia above 150mg /dl, associated with strict monitoring of blood glucose 4 times a day-before meals and at bedtime.   -Adjustment parameters for hypo and hyperglycemia were given in a written document to patient. -Patient is encouraged to call clinic for blood glucose levels less than 70 or above 300 mg /dl.  - she did not tolerate Metformin. - Patient specific target  for A1c; LDL, HDL, Triglycerides, and  Waist Circumference were discussed in detail.  2) BP/HTN: Marginal readings of blood pressure at 70/38, 89/40, 83/41 pulse rate is 77.  Patient was found to be lethargic this morning.  I have advised her caretaker to take patient to emergency room for better  evaluation and treatment. She is only on very low-dose metoprolol , and should be stopped if she continues to have hypotension.   -She will have a test for adrenal sufficiency before her next visit.    3) Lipids/HPL: continue statins. 4)  Weight/Diet:  exercise, and carbohydrates information provided.  5) Chronic Care/Health Maintenance:  - I have advised 1on 1 assistance at mealtimes and deligent hydration . Patient needs to keep her appointment with Ophthalmology, Podiatrist at least yearly or according to recommendations, and advised to  stay away from smoking. I have recommended yearly flu vaccine and pneumonia vaccination at least every 5 years; and  sleep for at least 7 hours a day.  I advised patient to maintain close follow  up with her PCP for primary care needs.  - Time spent with the patient: 25 min, of which >50% was spent in reviewing her blood glucose logs , discussing her hypo- and hyper-glycemic episodes, reviewing her current and  previous labs and insulin doses and developing a plan to avoid hypo- and hyper-glycemia. Please refer to Patient Instructions for Blood Glucose Monitoring and Insulin/Medications Dosing Guide"  in media tab for additional information.  Follow up plan: Return in about 3 months (around 06/28/2017), or to ER for low BP, for meter, and logs.  Glade Lloyd, MD Phone: [phone]  Fax: [phone]   This note was partially dictated with voice recognition software. Similar sounding words can be transcribed inadequately or may not  be corrected upon review.  03/28/2017, 1:14 PM

## 2017-03-28 NOTE — ED Notes (Signed)
Pt states she does not walk at home unless she has a walker. Went and got pt a walker and states she doesn't walk well. Told pt I would walk with her then pt refused to walk. States, 'No, I'm not going to walk. I don't want to. I just want to get out of here." Notified pt that she would have to walk before she can leave so I can assess her. Pt declined

## 2017-03-28 NOTE — ED Triage Notes (Signed)
Pt resident of [facility].  Pt c/o generalized weakness since waking this morning.  Pt went to endocrinologist today for follow up for her diabetes and was found to have low bp.  89/40-70/38.  Pt denies any pain, n/v/d or urinary symptoms.

## 2017-03-28 NOTE — Progress Notes (Signed)
ANTICOAGULATION CONSULT NOTE - Initial Consult  Pharmacy Consult for Coumadin (home med) Indication: VTE treatment  Allergies  Allergen Reactions  . Sulfa Antibiotics Rash  . Sulfonamide Derivatives Rash    REACTION: rash    Patient Measurements: Height: 5\' 4"  (162.6 cm) Weight: 158 lb (71.7 kg) IBW/kg (Calculated) : 54.7  Vital Signs: Temp: 98.2 F (36.8 C) (03/06 0937) Temp Source: Oral (03/06 0937) BP: 147/74 (03/06 1530) Pulse Rate: 72 (03/06 1530)  Labs: Recent Labs    03/28/17 0952  HGB 13.6  HCT 41.9  PLT 241  LABPROT 20.2*  INR 1.74  CREATININE 0.74  TROPONINI <0.03    Estimated Creatinine Clearance: 62.6 mL/min (by C-G formula based on SCr of 0.74 mg/dL).   Medical History: Past Medical History:  Diagnosis Date  . Anxiety   . Arthritis   . Depression    History of psychosis and previous suicide attempt  . DVT, lower extremity, recurrent (HCC)    Long-term Coumadin per Dr. Legrand Rams  . Essential hypertension   . GERD (gastroesophageal reflux disease)   . Hemiplegia ([place]) 2010   Left side  . History of stroke    Acute infarct and right cerebral white matter small vessel disease 12/10  . Leg DVT (deep venous thromboembolism), acute ([place]) 2006  . Schizophrenia ([place])   . Stroke [facility])    left sided weakness  . Type 2 diabetes mellitus (HCC)    Medications:   (Not in a hospital admission)  Assessment: 72yo female on Coumadin PTA.  INR is below target.  Pt reportedly took home dose of 2.5mg  today.  Will give additional 5mg  dose due to INR below target.    Goal of Therapy:  INR 2-3 Monitor platelets by anticoagulation protocol: Yes   Plan:  Coumadin 5mg  x 1 now INR daily  Hart Robinsons A 03/28/2017,4:29 PM

## 2017-03-28 NOTE — H&P (Signed)
History and Physical    [name] VHQ:[id number] DOB: 23-Apr-1945 DOA: 03/28/2017  PCP: [name], MD   Patient coming from: Home  Chief Complaint: Gen weakness, lightheaded on standing   HPI: [name] is a 72 y.o. female with medical history significant for history of CVA with left-sided weakness, history of DVT on warfarin, schizophrenia, insulin-dependent diabetes mellitus, and history of hypertension, now presenting to the emergency department for evaluation of generalized weakness, lightheadedness on standing, and hypotension in her endocrinologist office just prior to arrival.  Patient reports that she recently developed loose stools, and states that this has subsided after she stopped her laxatives and started loperamide as needed.  She reports the insidious development of generalized weakness and lightheadedness upon standing over the past couple days.  She denies any change in vision or hearing, and denies any new focal numbness or weakness.  Denies chest pain.  She reports dark stool chronically.  She saw her endocrinologist today in the clinic and was noted to have a blood pressure of 70/38.  She was directed to the ED for further evaluation of this.  ED Course: Upon arrival to the ED, patient is found to be afebrile, saturating well on room air, and with vitals otherwise stable.  EKG features a sinus rhythm with nonspecific ST abnormality.  Chest x-ray is negative for acute cardiopulmonary disease.  CT of the abdomen and pelvis is negative for acute intra-abdominal or pelvic abnormality.  Chemistry panel and CBC are unremarkable.  Fecal occult blood testing is negative.  Lactic acid is normal.  Troponin is undetectable.  Urinalysis is largely unremarkable.  INR is subtherapeutic at 1.74.  Patient was orthostatic in the ED with a 22 mmHg drop in systolic blood pressure upon standing.  She was started on normal saline infusion.  She remains hemodynamically stable, in no apparent  respiratory distress, and will be observed on the medical-surgical unit for ongoing evaluation and management of generalized weakness with hypotension, likely secondary to recent diarrhea that has since resolved, exacerbated by continued use of her antihypertensive.  Review of Systems:  All other systems reviewed and apart from HPI, are negative.  Past Medical History:  Diagnosis Date  . Anxiety   . Arthritis   . Depression    History of psychosis and previous suicide attempt  . DVT, lower extremity, recurrent (HCC)    Long-term Coumadin per Dr. Legrand Rams  . Essential hypertension   . GERD (gastroesophageal reflux disease)   . Hemiplegia ([place]) 2010   Left side  . History of stroke    Acute infarct and right cerebral white matter small vessel disease 12/10  . Leg DVT (deep venous thromboembolism), acute ([place]) 2006  . Schizophrenia ([place])   . Stroke [facility])    left sided weakness  . Type 2 diabetes mellitus ([place])     Past Surgical History:  Procedure Laterality Date  . BACK SURGERY    . BIOPSY N/A 11/24/2014   Procedure: BIOPSY;  Surgeon: Danie Binder, MD;  Location: [facility] OR[facility];  Service: Endoscopy;  Laterality: N/A;  . BIOPSY  05/17/2016   Procedure: BIOPSY;  Surgeon: Danie Binder, MD;  Location: [facility] END[facility] SU[facility];  Service: Endoscopy;;  gastric biopsy  . COLONOSCOPY WITH PROPOFOL N/A 11/24/2014   Dr. Rudie Meyer polyps removed/moderate sized internal hemorrhoids, tubular adenomas. Next surveillance in 3 years  . ESOPHAGOGASTRODUODENOSCOPY (EGD) WITH PROPOFOL N/A 11/24/2014   Dr. Clayburn Pert HH/patent stricture at the gastroesophageal junction, mild non-erosive gastritis, path negative  for H.pylori or celiac sprue  . ESOPHAGOGASTRODUODENOSCOPY (EGD) WITH PROPOFOL N/A 05/17/2016   Procedure: ESOPHAGOGASTRODUODENOSCOPY (EGD) WITH PROPOFOL;  Surgeon: Danie Binder, MD;  Location: [facility];  Service: Endoscopy;  Laterality: N/A;  12:45pm  . GIVENS CAPSULE STUDY N/A 12/11/2014   MULTILPLE  EROSION IN the stomach WITH ACTIVE OOZING. OCCASIONAL EROSIONS AND RARE ULCER SEEN IN PROXIMAL SMALL BOWEL . No masses or AVMs SEEN. NO OLD BLOOD OR FRESH BLOOD SEEN.   . POLYPECTOMY N/A 11/24/2014   Procedure: POLYPECTOMY;  Surgeon: Danie Binder, MD;  Location: [facility];  Service: Endoscopy;  Laterality: N/A;  . SAVORY DILATION N/A 05/17/2016   Procedure: SAVORY DILATION;  Surgeon: Danie Binder, MD;  Location: [facility];  Service: Endoscopy;  Laterality: N/A;     reports that she quit smoking about 22 years ago. Her smoking use included cigarettes. She has a 5.00 pack-year smoking history. she has never used smokeless tobacco. She reports that she does not drink alcohol or use drugs.  Allergies  Allergen Reactions  . Sulfa Antibiotics Rash  . Sulfonamide Derivatives Rash    REACTION: rash    Family History  Problem Relation Age of Onset  . Hypertension Mother   . Colon cancer Neg Hx      Prior to Admission medications   Medication Sig Start Date End Date Taking? Authorizing Provider  acetaminophen (TYLENOL) 325 MG tablet Take 650 mg by mouth every 6 (six) hours as needed. For fever > 101    Yes [provider]  ARIPiprazole (ABILIFY) 2 MG tablet Take 2 mg by mouth daily.  08/31/15  Yes [provider]  calcium carbonate (TUMS - DOSED IN MG ELEMENTAL CALCIUM) 500 MG chewable tablet Chew 1 tablet by mouth 4 (four) times daily as needed for indigestion or heartburn.    Yes [provider]  clonazePAM (KLONOPIN) 0.5 MG tablet Take 1 tablet (0.5 mg total) by mouth 4 (four) times daily. 10/07/16  Yes Theodis Blaze, MD  DULoxetine (CYMBALTA) 60 MG capsule Take 120 mg by mouth daily.    Yes [provider]  HYDROcodone-acetaminophen (NORCO/VICODIN) 5-325 MG tablet Take 1 tablet by mouth 3 (three) times daily. 10/07/16  Yes Theodis Blaze, MD  Insulin Aspart (NOVOLOG FLEXPEN [place]) Inject 15-21 Units into the skin 3 (three) times daily before meals.   Yes  [provider]  iron polysaccharides (NIFEREX) 150 MG capsule Take 1 capsule (150 mg total) by mouth daily. 04/10/16  Yes Kefalas, Manon Hilding, PA-C  latanoprost (XALATAN) 0.005 % ophthalmic solution Place 1 drop into both eyes at bedtime.   Yes [provider]  LEVEMIR FLEXTOUCH 100 UNIT/ML Pen INJECT 50 UNITS SUBCUTANEOUSLY AT BEDTIME. 12/25/16  Yes Nida, Marella Chimes, MD  loperamide (IMODIUM) 2 MG capsule Take 4 mg by mouth daily as needed for diarrhea or loose stools. Take 2 capsules initially then take 2 after each loose stool per [facility]   Yes [provider]  metoCLOPramide (REGLAN) 5 MG tablet Take 1 tablet (5 mg total) by mouth every 8 (eight) hours as needed for nausea or vomiting (or bloating). 03/11/16  Yes Francine Graven, DO  metoprolol succinate (TOPROL-XL) 25 MG 24 hr tablet Take 12.5 mg by mouth 2 (two) times daily.    Yes [provider]  mirtazapine (REMERON) 30 MG tablet Take 30 mg by mouth at bedtime.     Yes [provider]  naloxegol oxalate (MOVANTIK) 12.5 MG TABS tablet Take  12.5 mg by mouth daily.   Yes [provider]  NOVOFINE AUTOCOVER 30G X 8 MM MISC  10/07/16  Yes [provider]  NOVOLOG FLEXPEN 100 UNIT/ML FlexPen INJECT 15 UNITS SUBCUTANEOUSLY BEFORE MEALS IF BS IS BETWEEN 90-150. 12/29/16  Yes Nida, Marella Chimes, MD  omeprazole (PRILOSEC) 40 MG capsule Take 40 mg by mouth daily.   Yes [provider]  ondansetron (ZOFRAN) 4 MG tablet Take 1 tablet (4 mg total) by mouth 4 (four) times daily -  before meals and at bedtime. 04/20/16  Yes Annitta Needs, NP  polyethylene glycol powder (GLYCOLAX/MIRALAX) powder Take 17 g by mouth daily as needed.   Yes [provider]  polyvinyl alcohol (ARTIFICIAL TEARS) 1.4 % ophthalmic solution Place 1 drop into both eyes 3 (three) times daily.   Yes [provider]  promethazine (PHENERGAN) 12.5 MG tablet TAKE 1 TABLET BY MOUTH EVERY 6 HOURS AS NEEDED  FOR NAUSEA OR VOMITING. 04/12/16  Yes Mahala Menghini, PA-C  risperiDONE (RISPERDAL) 3 MG tablet Take 3 mg by mouth 2 (two) times daily.    Yes [provider]  simvastatin (ZOCOR) 20 MG tablet Take 20 mg by mouth at bedtime.  03/31/16  Yes [provider]  solifenacin (VESICARE) 5 MG tablet Take 5 mg by mouth daily.   Yes [provider]  tiZANidine (ZANAFLEX) 2 MG tablet Take 2 mg by mouth daily.   Yes [provider]  Vitamin D, Ergocalciferol, (DRISDOL) 50000 UNITS CAPS capsule Take 50,000 Units by mouth every Friday.    Yes [provider]  warfarin (COUMADIN) 5 MG tablet TAKE 1/2 TABLET (2.5mg ) BY MOUTH ONCE DAILY ON MON,WED,FRI & SATURDAY. TAKE 1 TABLET (5mg ) ON SUNDAY, TUES,& THURS. 01/05/17  Yes Satira Sark, MD  zolpidem (AMBIEN) 10 MG tablet Take 1 tablet (10 mg total) by mouth at bedtime. 10/07/16  Yes Theodis Blaze, MD    Physical Exam: Vitals:   03/28/17 1330 03/28/17 1400 03/28/17 1430 03/28/17 1530  BP: (!) 119/57 112/61 122/60 (!) 147/74  Pulse: 64 76 64 72  Resp: 19 19 (!) 22 15  Temp:      TempSrc:      SpO2: 94% 96% 97% 96%  Weight:      Height:          Constitutional: NAD, calm  Eyes: PERTLA, lids and conjunctivae normal ENMT: Mucous membranes are moist. Posterior pharynx clear of any exudate or lesions.   Neck: normal, supple, no masses, no thyromegaly Respiratory: clear to auscultation bilaterally, no wheezing, no crackles. Normal respiratory effort.   Cardiovascular: S1 & S2 heard, regular rate and rhythm. No significant JVD. Abdomen: No distension, no tenderness, no masses palpated. Bowel sounds normal.  Musculoskeletal: no clubbing / cyanosis. No joint deformity upper and lower extremities.   Skin: no significant rashes, lesions, ulcers. Poor turgor. Neurologic: CN 2-12 grossly intact. Sensation intact. Left-sided hemiplegia .  Psychiatric: Alert and oriented x 3. Calm and cooperative.     Labs on  Admission: I have personally reviewed following labs and imaging studies  CBC: Recent Labs  Lab 03/28/17 0952  WBC 5.9  NEUTROABS 3.5  HGB 13.6  HCT 41.9  MCV 90.7  PLT 102   Basic Metabolic Panel: Recent Labs  Lab 03/28/17 0952  NA 137  K 3.8  CL 99*  CO2 28  GLUCOSE 144*  BUN 14  CREATININE 0.74  CALCIUM 9.0   GFR: Estimated Creatinine Clearance: 62.6 mL/min (  by C-G formula based on SCr of 0.74 mg/dL). Liver Function Tests: Recent Labs  Lab 03/28/17 0952  AST 14*  ALT 12*  ALKPHOS 81  BILITOT 0.5  PROT 7.3  ALBUMIN 3.9   Recent Labs  Lab 03/28/17 0952  LIPASE 20   No results for input(s): AMMONIA in the last 168 hours. Coagulation Profile: Recent Labs  Lab 03/28/17 0952  INR 1.74   Cardiac Enzymes: Recent Labs  Lab 03/28/17 0952  TROPONINI <0.03   BNP (last 3 results) No results for input(s): PROBNP in the last 8760 hours. HbA1C: No results for input(s): HGBA1C in the last 72 hours. CBG: No results for input(s): GLUCAP in the last 168 hours. Lipid Profile: No results for input(s): CHOL, HDL, LDLCALC, TRIG, CHOLHDL, LDLDIRECT in the last 72 hours. Thyroid Function Tests: No results for input(s): TSH, T4TOTAL, FREET4, T3FREE, THYROIDAB in the last 72 hours. Anemia Panel: No results for input(s): VITAMINB12, FOLATE, FERRITIN, TIBC, IRON, RETICCTPCT in the last 72 hours. Urine analysis:    Component Value Date/Time   COLORURINE YELLOW 03/28/2017 0942   APPEARANCEUR CLEAR 03/28/2017 0942   APPEARANCEUR Cloudy (A) 05/01/2016 1525   LABSPEC 1.010 03/28/2017 0942   PHURINE 6.0 03/28/2017 0942   GLUCOSEU NEGATIVE 03/28/2017 0942   HGBUR NEGATIVE 03/28/2017 0942   BILIRUBINUR NEGATIVE 03/28/2017 0942   BILIRUBINUR Negative 05/01/2016 1525   KETONESUR NEGATIVE 03/28/2017 0942   PROTEINUR NEGATIVE 03/28/2017 0942   UROBILINOGEN 0.2 09/14/2014 1703   NITRITE NEGATIVE 03/28/2017 0942   LEUKOCYTESUR SMALL (A) 03/28/2017 0942   LEUKOCYTESUR 3+  (A) 05/01/2016 1525   Sepsis Labs: @LABRCNTIP (procalcitonin:4,lacticidven:4) )No results found for this or any previous visit (from the past 240 hour(s)).   Radiological Exams on Admission: Dg Chest 2 View  Result Date: 03/28/2017 CLINICAL DATA:  Generalized weakness upon awakening this morning. Hypotension, diabetes, former smoker. EXAM: CHEST - 2 VIEW COMPARISON:  Portable chest x-ray of October 06, 2016 and PA and lateral chest x-ray of February 13, 2016 and December 27, 2010. FINDINGS: The right lung is well-expanded and clear. The left lung also appears clear. The cardiac silhouette is enlarged. The pulmonary vascularity is not engorged. There is stable dextrocurvature centered in the midthoracic spine. There is calcification in the wall of the aortic arch. There is no pleural effusion. There is a stable calcified nodule projecting over the lower thoracic spine which may reflect a bone island or a calcified nodule. It is unchanged since December 27, 2010. IMPRESSION: There is no acute cardiopulmonary disease. Stable cardiomegaly. Probable COPD-smoking related changes. Thoracic aortic atherosclerosis. Electronically Signed   By: David  Martinique M.D.   On: 03/28/2017 10:27   Ct Abdomen Pelvis W Contrast  Result Date: 03/28/2017 CLINICAL DATA:  Dark stools and abdominal tenderness x 2 weeks; Pt resident of [place]. Pt C/o generalized weakness since waking this morning. EXAM: CT ABDOMEN AND PELVIS WITH CONTRAST TECHNIQUE: Multidetector CT imaging of the abdomen and pelvis was performed using the standard protocol following bolus administration of intravenous contrast. CONTRAST:  149mL ISOVUE-300 IOPAMIDOL (ISOVUE-300) INJECTION 61% COMPARISON:  03/11/2016, 02/23/2010 FINDINGS: Lower chest: 12 mm centrally calcified right lower lobe pulmonary nodule unchanged compared with 02/23/2010 most consistent with sequela prior granulomatous disease. Hepatobiliary: 3.3 x 3.5 cm mass in the right hepatic lobe  peripherally with peripheral nodular centripetal enhancement most consistent with a hemangioma. No other hepatic mass. Cholelithiasis. No pericholecystic fluid or inflammatory changes. No intrahepatic or extrahepatic biliary ductal dilatation. Pancreas: Unremarkable. No pancreatic ductal  dilatation or surrounding inflammatory changes. Spleen: Normal in size without focal abnormality. Adrenals/Urinary Tract: Adrenal glands are unremarkable. Kidneys are normal, without renal calculi, focal lesion, or hydronephrosis. Bladder is unremarkable. Stomach/Bowel: No pneumatosis, pneumoperitoneum or portal venous gas. No bowel obstruction. No bowel wall thickening or inflammatory changes. Moderate amount of stool throughout the colon. Normal appendix. Vascular/Lymphatic: Abdominal aortic atherosclerosis. Normal caliber abdominal aorta. No lymphadenopathy. Reproductive: 5.3 x 3.8 cm hypodense, fluid attenuating left ovarian mass most consistent with a cyst. No other adnexal mass. Normal uterus. Other: No abdominal wall hernia or abnormality. No abdominopelvic ascites. Focal area of inflammatory changes in the right anterior abdominal wall measuring approximately 14 mm most consistent with an area of fat necrosis. Musculoskeletal: No acute osseous abnormality. No aggressive osseous lesion. Grade 1 anterolisthesis of L4 on L5. Severe bilateral facet arthropathy L4-5 and broad-based disc bulge with overall severe spinal stenosis at L4-5. Degenerative disc disease with disc height loss and bilateral moderate facet arthropathy at L5-S1. IMPRESSION: 1. No acute intraabdominal or pelvic pathology. 2. Focal area of inflammatory changes in the right anterior abdominal wall measuring approximately 14 mm most consistent with an area of fat necrosis. 3. Moderate amount of stool throughout the colon. 4. Multifactorial L4-5 severe spinal stenosis. Electronically Signed   By: Kathreen Devoid   On: 03/28/2017 11:04    EKG: Independently  reviewed. Sinus rhythm, non-specific ST abnormality in inferior and anterior leads.   Assessment/Plan  1. Generalized weakness; orthostasis  - Presents with 2 weeks of progressive gen weakness, lightheaded on standing, found to have BP 70/38 in endocrinology clinic just prior to arrival in ED  - Likely secondary to recent diarrhea that has resolved with cessation of laxatives and prn loperamide   - No evidence for infectious etiology  - Hold Toprol, continue IVF hydration, repeat orthostatic vitals in am    2. Hx of hypertension  - BP was 70/38 at endocrinology clinic just prior to arrival in ED and she is orthostatic in ED  - Hold Toprol, hydrate as above    3. Hx of DVT  - No evidence for acute VTE  - Continue warfarin    4. Hx of CVA with left-sided weakness  - Stable left-sided weakness, no acute deficits identified  - Continue statin and anticoagulation    5. Schizophrenia  - Denies SI, HI, or active hallucinations   - Continue Risperdal, Abilify, Remeron, Cymbalta, and Klonopin    6. Insulin-dependent DM   - A1c was 6.5% last month  - Managed at home with Levemir 50 units qHS and Novolog TID per sliding-scale  - She has had poor appetite recently  - Start with Levemir with 30 units qHS and Novolog per sliding-scale while in hospital     DVT prophylaxis: warfarin  Code Status: Full  Family Communication: Discussed with patient Consults called: None Admission status: Observation    Vianne Bulls, MD [facility] Pager [phone]  If 7PM-7AM, please contact night-coverage [email or web address] Password [facility]  03/28/2017, 4:25 PM

## 2017-03-28 NOTE — ED Notes (Signed)
Have given pt a meal tray  

## 2017-03-28 NOTE — ED Notes (Signed)
Pt able to tolerate fluid challenge 

## 2017-03-28 NOTE — ED Notes (Signed)
Pt told edp she has been having dark stools recently and abd tenderness x 2 weeks.

## 2017-03-28 NOTE — ED Notes (Signed)
Pt unable to stand without a two person assist. Pt weak on left side. Per pt this is baseline. Pt requesting to put on her regular clothes.

## 2017-03-28 NOTE — ED Provider Notes (Signed)
[facility] EMERGENCY DEPARTMENT Provider Note   CSN: [id number] Arrival date & time: 03/28/17  2353     History   Chief Complaint Chief Complaint  Patient presents with  . Hypotension    HPI [name] is a 72 y.o. female.  HPI Pt was seen at 0930. Per EMS, NH report, Endo MD office notes and pt: c/o gradual onset and persistence of constant generalized weakness for the past 2 days. Has been associated with "low BP" today in her Endo MD office:  "89/40, 70/38." Pt states she has been having "dark stools" and left sided abd "pain" for the past 2 weeks. Also endorses multiple episodes of loose stools recently. Denies N/V, no back pain, no CP/SOB, no cough, no fevers, no blood in stools, no new focal motor weakness or tingling/numbness in extremities.    Past Medical History:  Diagnosis Date  . Anxiety   . Arthritis   . Depression    History of psychosis and previous suicide attempt  . DVT, lower extremity, recurrent (HCC)    Long-term Coumadin per Dr. Legrand Rams  . Essential hypertension   . GERD (gastroesophageal reflux disease)   . Hemiplegia ([place]) 2010   Left side  . History of stroke    Acute infarct and right cerebral white matter small vessel disease 12/10  . Leg DVT (deep venous thromboembolism), acute ([place]) 2006  . Schizophrenia ([place])   . Stroke [facility])    left sided weakness  . Type 2 diabetes mellitus [facility])     Patient Active Problem List   Diagnosis Date Noted  . IBS (irritable bowel syndrome) 10/16/2016  . Left hemiparesis ([place]) 10/06/2016  . Left-sided weakness 10/06/2016  . Chronic superficial gastritis without bleeding   . Stricture and stenosis of esophagus   . Nausea with vomiting 04/20/2016  . Abdominal pain, acute, bilateral lower quadrant 03/22/2016  . Lesion of right lobe of liver 03/22/2016  . Elevated troponin 02/13/2016  . IDA (iron deficiency anemia) 04/06/2015  . Mixed hyperlipidemia 11/06/2014  . Obesity due to excess calories 11/06/2014    . Sedentary lifestyle 11/06/2014  . Rectal bleeding 10/21/2014  . Encounter for therapeutic drug monitoring 03/24/2013  . Chronic anticoagulation 04/09/2010  . Type 2 diabetes mellitus with vascular disease ([place]) 03/24/2010  . Essential hypertension, benign 03/24/2010  . DVT, lower extremity, recurrent ([place]) 03/24/2010  . History of cardiovascular disorder 03/24/2010    Past Surgical History:  Procedure Laterality Date  . BACK SURGERY    . BIOPSY N/A 11/24/2014   Procedure: BIOPSY;  Surgeon: Danie Binder, MD;  Location: [facility] ORS;  Service: Endoscopy;  Laterality: N/A;  . BIOPSY  05/17/2016   Procedure: BIOPSY;  Surgeon: Danie Binder, MD;  Location: [facility] ENDO SUITE;  Service: Endoscopy;;  gastric biopsy  . COLONOSCOPY WITH PROPOFOL N/A 11/24/2014   Dr. Rudie Meyer polyps removed/moderate sized internal hemorrhoids, tubular adenomas. Next surveillance in 3 years  . ESOPHAGOGASTRODUODENOSCOPY (EGD) WITH PROPOFOL N/A 11/24/2014   Dr. Clayburn Pert HH/patent stricture at the gastroesophageal junction, mild non-erosive gastritis, path negative for H.pylori or celiac sprue  . ESOPHAGOGASTRODUODENOSCOPY (EGD) WITH PROPOFOL N/A 05/17/2016   Procedure: ESOPHAGOGASTRODUODENOSCOPY (EGD) WITH PROPOFOL;  Surgeon: Danie Binder, MD;  Location: [facility] ENDO SUITE;  Service: Endoscopy;  Laterality: N/A;  12:45pm  . GIVENS CAPSULE STUDY N/A 12/11/2014   MULTILPLE EROSION IN the stomach WITH ACTIVE OOZING. OCCASIONAL EROSIONS AND RARE ULCER SEEN IN PROXIMAL SMALL BOWEL . No masses or AVMs SEEN. NO OLD  BLOOD OR FRESH BLOOD SEEN.   . POLYPECTOMY N/A 11/24/2014   Procedure: POLYPECTOMY;  Surgeon: Danie Binder, MD;  Location: [facility] ORS;  Service: Endoscopy;  Laterality: N/A;  . SAVORY DILATION N/A 05/17/2016   Procedure: SAVORY DILATION;  Surgeon: Danie Binder, MD;  Location: [facility] ENDO SUITE;  Service: Endoscopy;  Laterality: N/A;    OB History    No data available       Home Medications    Prior to Admission  medications   Medication Sig Start Date End Date Taking? Authorizing Provider  acetaminophen (TYLENOL) 325 MG tablet Take 650 mg by mouth every 6 (six) hours as needed. For fever > 101     [provider]  ARIPiprazole (ABILIFY) 2 MG tablet Take 2 mg by mouth daily.  08/31/15   [provider]  calcium carbonate (TUMS - DOSED IN MG ELEMENTAL CALCIUM) 500 MG chewable tablet Chew 1 tablet by mouth 4 (four) times daily as needed for indigestion or heartburn.     [provider]  clonazePAM (KLONOPIN) 0.5 MG tablet Take 1 tablet (0.5 mg total) by mouth 4 (four) times daily. 10/07/16   Theodis Blaze, MD  DULoxetine (CYMBALTA) 60 MG capsule Take 120 mg by mouth daily.     [provider]  HYDROcodone-acetaminophen (NORCO/VICODIN) 5-325 MG tablet Take 1 tablet by mouth 3 (three) times daily. 10/07/16   Theodis Blaze, MD  Insulin Aspart (NOVOLOG FLEXPEN ) Inject 15-21 Units into the skin 3 (three) times daily before meals.    [provider]  iron polysaccharides (NIFEREX) 150 MG capsule Take 1 capsule (150 mg total) by mouth daily. 04/10/16   Baird Cancer, PA-C  latanoprost (XALATAN) 0.005 % ophthalmic solution Place 1 drop into both eyes at bedtime.    [provider]  LEVEMIR FLEXTOUCH 100 UNIT/ML Pen INJECT 50 UNITS SUBCUTANEOUSLY AT BEDTIME. 12/25/16   Nida, Marella Chimes, MD  loperamide (IMODIUM) 2 MG capsule Take 4 mg by mouth daily as needed for diarrhea or loose stools. Take 2 capsules initially then take 2 after each loose stool per [facility]    [provider]  metoCLOPramide (REGLAN) 5 MG tablet Take 1 tablet (5 mg total) by mouth every 8 (eight) hours as needed for nausea or vomiting (or bloating). 03/11/16   Francine Graven, DO  metoprolol succinate (TOPROL-XL) 25 MG 24 hr tablet Take 12.5 mg by mouth 2 (two) times daily.     [provider]  mirtazapine (REMERON) 30 MG tablet Take 30 mg by mouth at bedtime.      [provider]  naloxegol oxalate (MOVANTIK) 12.5 MG TABS tablet Take 12.5 mg by mouth daily.    [provider]  NOVOFINE AUTOCOVER 30G X 8 MM MISC  10/07/16   [provider]  NOVOLOG FLEXPEN 100 UNIT/ML FlexPen INJECT 15 UNITS SUBCUTANEOUSLY BEFORE MEALS IF BS IS BETWEEN 90-150. 12/29/16   Nida, Marella Chimes, MD  omeprazole (PRILOSEC) 40 MG capsule Take 40 mg by mouth daily.    [provider]  ondansetron (ZOFRAN) 4 MG tablet Take 1 tablet (4 mg total) by mouth 4 (four) times daily -  before meals and at bedtime. 04/20/16   Annitta Needs, NP  polyethylene glycol powder (GLYCOLAX/MIRALAX) powder Take 17 g by mouth daily as needed.    [provider]  polyvinyl alcohol (ARTIFICIAL TEARS) 1.4 % ophthalmic solution Place 1 drop into both eyes 3 (three) times daily.  [provider]  promethazine (PHENERGAN) 12.5 MG tablet TAKE 1 TABLET BY MOUTH EVERY 6 HOURS AS NEEDED FOR NAUSEA OR VOMITING. 04/12/16   Mahala Menghini, PA-C  risperiDONE (RISPERDAL) 3 MG tablet Take 3 mg by mouth 2 (two) times daily.     [provider]  simvastatin (ZOCOR) 20 MG tablet Take 20 mg by mouth at bedtime.  03/31/16   [provider]  solifenacin (VESICARE) 5 MG tablet Take 5 mg by mouth daily.    [provider]  tiZANidine (ZANAFLEX) 2 MG tablet Take 2 mg by mouth daily.    [provider]  Vitamin D, Ergocalciferol, (DRISDOL) 50000 UNITS CAPS capsule Take 50,000 Units by mouth every Friday.     [provider]  warfarin (COUMADIN) 5 MG tablet TAKE 1/2 TABLET (2.5mg ) BY MOUTH ONCE DAILY ON MON,WED,FRI & SATURDAY. TAKE 1 TABLET (5mg ) ON SUNDAY, TUES,& THURS. 01/05/17   Satira Sark, MD  zolpidem (AMBIEN) 10 MG tablet Take 1 tablet (10 mg total) by mouth at bedtime. 10/07/16   Theodis Blaze, MD    Family History Family History  Problem Relation Age of Onset  . Hypertension Mother   . Colon cancer Neg Hx     Social  History Social History   Tobacco Use  . Smoking status: Former Smoker    Packs/day: 0.25    Years: 20.00    Pack years: 5.00    Types: Cigarettes    Last attempt to quit: 01/24/1995    Years since quitting: 22.1  . Smokeless tobacco: Never Used  Substance Use Topics  . Alcohol use: No    Alcohol/week: 0.0 oz  . Drug use: No     Allergies   Sulfa antibiotics and Sulfonamide derivatives   Review of Systems Review of Systems ROS: Statement: All systems negative except as marked or noted in the HPI; Constitutional: Negative for fever and chills. +generalized weakness. ; ; Eyes: Negative for eye pain, redness and discharge. ; ; ENMT: Negative for ear pain, hoarseness, nasal congestion, sinus pressure and sore throat. ; ; Cardiovascular: Negative for chest pain, palpitations, diaphoresis, dyspnea and peripheral edema. ; ; Respiratory: Negative for cough, wheezing and stridor. ; ; Gastrointestinal: +abd pain, "dark stools," diarrhea. Negative for nausea, vomiting, blood in stool, hematemesis, jaundice and rectal bleeding. . ; ; Genitourinary: Negative for dysuria, flank pain and hematuria. ; ; Musculoskeletal: Negative for back pain and neck pain. Negative for swelling and trauma.; ; Skin: Negative for pruritus, rash, abrasions, blisters, bruising and skin lesion.; ; Neuro: Negative for headache, lightheadedness and neck stiffness. Negative for altered level of consciousness, altered mental status, extremity weakness, paresthesias, involuntary movement, seizure and syncope.       Physical Exam Updated Vital Signs BP (!) 144/67 (BP Location: Left Arm)   Pulse 66   Temp 98.2 F (36.8 C) (Oral)   Resp 18   Ht 5\' 4"  (1.626 m)   Wt 71.7 kg (158 lb)   SpO2 96%   BMI 27.12 kg/m    Patient Vitals for the past 24 hrs:  BP Temp Temp src Pulse Resp SpO2 Height Weight  03/28/17 1430 122/60 - - 64 (!) 22 97 % - -  03/28/17 1400 112/61 - - 76 19 96 % - -  03/28/17 1330 (!) 119/57 - - 64 19 94  % - -  03/28/17 1300 (!) 112/55 - - 72 (!) 21 94 % - -  03/28/17 1200 (!) 151/61 - -  64 16 94 % - -  03/28/17 1130 (!) 148/63 - - 62 17 94 % - -  03/28/17 1100 140/72 - - 67 15 97 % - -  03/28/17 1000 136/75 - - 64 15 94 % - -  03/28/17 0937 (!) 144/67 98.2 F (36.8 C) Oral 66 18 96 % - -  03/28/17 0936 - - - - - - 5\' 4"  (1.626 m) 71.7 kg (158 lb)  03/28/17 0933 - - - 66 - 97 % - -  03/28/17 0932 (!) 144/67 - - - - - - -     09:52:52 Orthostatic Vital Signs RE  Orthostatic Lying   BP- Lying: 151/71  Pulse- Lying: 65      Orthostatic Sitting  BP- Sitting: 146/76  Pulse- Sitting: 75      Orthostatic Standing at 0 minutes  BP- Standing at 0 minutes: 131/68  Pulse- Standing at 0 minutes: 71    13:49 Orthostatic Vital Signs HC  Orthostatic Lying   BP- Lying: 116/55  Pulse- Lying: 76      Orthostatic Sitting  BP- Sitting: 115/54  Pulse- Sitting: 82      Orthostatic Standing at 0 minutes  BP- Standing at 0 minutes: 94/74  Pulse- Standing at 0 minutes: 87     Physical Exam 0935: Physical examination:  Nursing notes reviewed; Vital signs and O2 SAT reviewed;  Constitutional: Well developed, Well nourished, In no acute distress; Head:  Normocephalic, atraumatic; Eyes: EOMI, PERRL, No scleral icterus; ENMT: Mouth and pharynx normal, Mucous membranes dry; Neck: Supple, Full range of motion, No lymphadenopathy; Cardiovascular: Regular rate and rhythm, No gallop; Respiratory: Breath sounds clear & equal bilaterally, No wheezes. Speaking full sentences with ease, Normal respiratory effort/excursion; Chest: Nontender, Movement normal; Abdomen: Soft, +LLQ tenderness to palp. No rebound or guarding. Nondistended, Normal bowel sounds. Rectal exam performed w/permission of pt and ED RN chaperone present.  Anal tone normal.  Non-tender, soft black stool in rectal vault, heme neg.  No fissures, no external hemorrhoids, no palp masses.;; Genitourinary: No CVA tenderness; Extremities: Pulses  normal, No tenderness, No edema, No calf edema or asymmetry.; Neuro: Awake, alert, vague historian. No facial droop.Marland Kitchen  Speech clear. +left sided weakness per hx previous CVA, otherwise moves extremities on stretcher and to command without apparent gross focal motor deficits.; Skin: Color pale, Warm, Dry.    ED Treatments / Results  Labs (all labs ordered are listed, but only abnormal results are displayed)   EKG  EKG Interpretation  Date/Time:  Wednesday March 28 2017 10:09:29 EST Ventricular Rate:  64 PR Interval:    QRS Duration: 98 QT Interval:  442 QTC Calculation: 460 R Axis:   80 Text Interpretation:  Poor data quality Normal sinus rhythm Inferior infarct, old When compared with ECG of 10/06/2016 No significant change was found Confirmed by Francine Graven [phone]) on 03/28/2017 10:42:50 AM       Radiology   Procedures Procedures (including critical care time)  Medications Ordered in ED Medications  0.9 %  sodium chloride infusion (not administered)  iopamidol (ISOVUE-300) 61 % injection 100 mL (100 mLs Intravenous Contrast Given 03/28/17 1040)     Initial Impression / Assessment and Plan / ED Course  I have reviewed the triage vital signs and the nursing notes.  Pertinent labs & imaging results that were available during my care of the patient were reviewed by me and considered in my medical decision making (see chart for details).  MDM Reviewed: previous chart,  nursing note and vitals Reviewed previous: labs and ECG Interpretation: labs, ECG, x-ray and CT scan   Results for orders placed or performed during the hospital encounter of 03/28/17  Protime-INR  Result Value Ref Range   Prothrombin Time 20.2 (H) 11.4 - 15.2 seconds   INR 1.74   Urinalysis, Routine w reflex microscopic  Result Value Ref Range   Color, Urine YELLOW YELLOW   APPearance CLEAR CLEAR   Specific Gravity, Urine 1.010 1.005 - 1.030   pH 6.0 5.0 - 8.0   Glucose, UA NEGATIVE NEGATIVE  mg/dL   Hgb urine dipstick NEGATIVE NEGATIVE   Bilirubin Urine NEGATIVE NEGATIVE   Ketones, ur NEGATIVE NEGATIVE mg/dL   Protein, ur NEGATIVE NEGATIVE mg/dL   Nitrite NEGATIVE NEGATIVE   Leukocytes, UA SMALL (A) NEGATIVE   RBC / HPF 0-5 0 - 5 RBC/hpf   WBC, UA 6-30 0 - 5 WBC/hpf   Bacteria, UA RARE (A) NONE SEEN   Squamous Epithelial / LPF 6-30 (A) NONE SEEN   Hyaline Casts, UA PRESENT    Non Squamous Epithelial 0-5 (A) NONE SEEN  Comprehensive metabolic panel  Result Value Ref Range   Sodium 137 135 - 145 mmol/L   Potassium 3.8 3.5 - 5.1 mmol/L   Chloride 99 (L) 101 - 111 mmol/L   CO2 28 22 - 32 mmol/L   Glucose, Bld 144 (H) 65 - 99 mg/dL   BUN 14 6 - 20 mg/dL   Creatinine, Ser 0.74 0.44 - 1.00 mg/dL   Calcium 9.0 8.9 - 10.3 mg/dL   Total Protein 7.3 6.5 - 8.1 g/dL   Albumin 3.9 3.5 - 5.0 g/dL   AST 14 (L) 15 - 41 U/L   ALT 12 (L) 14 - 54 U/L   Alkaline Phosphatase 81 38 - 126 U/L   Total Bilirubin 0.5 0.3 - 1.2 mg/dL   GFR calc non Af Amer >60 >60 mL/min   GFR calc Af Amer >60 >60 mL/min   Anion gap 10 5 - 15  Lipase, blood  Result Value Ref Range   Lipase 20 11 - 51 U/L  Troponin I  Result Value Ref Range   Troponin I <0.03 <0.03 ng/mL  Lactic acid, plasma  Result Value Ref Range   Lactic Acid, Venous 1.3 0.5 - 1.9 mmol/L  CBC with Differential  Result Value Ref Range   WBC 5.9 4.0 - 10.5 K/uL   RBC 4.62 3.87 - 5.11 MIL/uL   Hemoglobin 13.6 12.0 - 15.0 g/dL   HCT 41.9 36.0 - 46.0 %   MCV 90.7 78.0 - 100.0 fL   MCH 29.4 26.0 - 34.0 pg   MCHC 32.5 30.0 - 36.0 g/dL   RDW 12.9 11.5 - 15.5 %   Platelets 241 150 - 400 K/uL   Neutrophils Relative % 59 %   Neutro Abs 3.5 1.7 - 7.7 K/uL   Lymphocytes Relative 31 %   Lymphs Abs 1.8 0.7 - 4.0 K/uL   Monocytes Relative 8 %   Monocytes Absolute 0.5 0.1 - 1.0 K/uL   Eosinophils Relative 2 %   Eosinophils Absolute 0.1 0.0 - 0.7 K/uL   Basophils Relative 0 %   Basophils Absolute 0.0 0.0 - 0.1 K/uL  Lactic acid, plasma   Result Value Ref Range   Lactic Acid, Venous 1.5 0.5 - 1.9 mmol/L  POC occult blood, ED  Result Value Ref Range   Fecal Occult Bld NEGATIVE NEGATIVE  Sample to Blood Bank  Result Value Ref  Range   Blood Bank Specimen SAMPLE AVAILABLE FOR TESTING    Sample Expiration      03/31/2017 Performed at [facility], [address], [place], [place] [address]    Dg Chest 2 View Result Date: 03/28/2017 CLINICAL DATA:  Generalized weakness upon awakening this morning. Hypotension, diabetes, former smoker. EXAM: CHEST - 2 VIEW COMPARISON:  Portable chest x-ray of October 06, 2016 and PA and lateral chest x-ray of February 13, 2016 and December 27, 2010. FINDINGS: The right lung is well-expanded and clear. The left lung also appears clear. The cardiac silhouette is enlarged. The pulmonary vascularity is not engorged. There is stable dextrocurvature centered in the midthoracic spine. There is calcification in the wall of the aortic arch. There is no pleural effusion. There is a stable calcified nodule projecting over the lower thoracic spine which may reflect a bone island or a calcified nodule. It is unchanged since December 27, 2010. IMPRESSION: There is no acute cardiopulmonary disease. Stable cardiomegaly. Probable COPD-smoking related changes. Thoracic aortic atherosclerosis. Electronically Signed   By: David  Martinique M.D.   On: 03/28/2017 10:27    Ct Abdomen Pelvis W Contrast Result Date: 03/28/2017 CLINICAL DATA:  Dark stools and abdominal tenderness x 2 weeks; Pt resident of [place]. Pt C/o generalized weakness since waking this morning. EXAM: CT ABDOMEN AND PELVIS WITH CONTRAST TECHNIQUE: Multidetector CT imaging of the abdomen and pelvis was performed using the standard protocol following bolus administration of intravenous contrast. CONTRAST:  175mL ISOVUE-300 IOPAMIDOL (ISOVUE-300) INJECTION 61% COMPARISON:  03/11/2016, 02/23/2010 FINDINGS: Lower chest: 12 mm centrally calcified right lower lobe  pulmonary nodule unchanged compared with 02/23/2010 most consistent with sequela prior granulomatous disease. Hepatobiliary: 3.3 x 3.5 cm mass in the right hepatic lobe peripherally with peripheral nodular centripetal enhancement most consistent with a hemangioma. No other hepatic mass. Cholelithiasis. No pericholecystic fluid or inflammatory changes. No intrahepatic or extrahepatic biliary ductal dilatation. Pancreas: Unremarkable. No pancreatic ductal dilatation or surrounding inflammatory changes. Spleen: Normal in size without focal abnormality. Adrenals/Urinary Tract: Adrenal glands are unremarkable. Kidneys are normal, without renal calculi, focal lesion, or hydronephrosis. Bladder is unremarkable. Stomach/Bowel: No pneumatosis, pneumoperitoneum or portal venous gas. No bowel obstruction. No bowel wall thickening or inflammatory changes. Moderate amount of stool throughout the colon. Normal appendix. Vascular/Lymphatic: Abdominal aortic atherosclerosis. Normal caliber abdominal aorta. No lymphadenopathy. Reproductive: 5.3 x 3.8 cm hypodense, fluid attenuating left ovarian mass most consistent with a cyst. No other adnexal mass. Normal uterus. Other: No abdominal wall hernia or abnormality. No abdominopelvic ascites. Focal area of inflammatory changes in the right anterior abdominal wall measuring approximately 14 mm most consistent with an area of fat necrosis. Musculoskeletal: No acute osseous abnormality. No aggressive osseous lesion. Grade 1 anterolisthesis of L4 on L5. Severe bilateral facet arthropathy L4-5 and broad-based disc bulge with overall severe spinal stenosis at L4-5. Degenerative disc disease with disc height loss and bilateral moderate facet arthropathy at L5-S1. IMPRESSION: 1. No acute intraabdominal or pelvic pathology. 2. Focal area of inflammatory changes in the right anterior abdominal wall measuring approximately 14 mm most consistent with an area of fat necrosis. 3. Moderate amount of  stool throughout the colon. 4. Multifactorial L4-5 severe spinal stenosis. Electronically Signed   By: Kathreen Devoid   On: 03/28/2017 11:04    1520:   Pt initial orthostatic VS normal. Pt tol PO. Udip appears contaminated. Workup reassuring. While waiting for 2nd lactic acid, pt attempted to walk with walker and ED Techs. Pt unable  to stand/walk due to generalized weakness. Repeat orthostatic VS with decreasing BP. Judicious IVF ordered. Will obs admit overnight for IV hydration. T/C returned from [facility]. Myna Hidalgo, case discussed, including:  HPI, pertinent PM/SHx, VS/PE, dx testing, ED course and treatment:  Agreeable to admit.     Final Clinical Impressions(s) / ED Diagnoses   Final diagnoses:  None    ED Discharge Orders    None       Francine Graven, DO 03/29/17 2220

## 2017-03-29 DIAGNOSIS — I951 Orthostatic hypotension: Secondary | ICD-10-CM | POA: Diagnosis not present

## 2017-03-29 DIAGNOSIS — G8194 Hemiplegia, unspecified affecting left nondominant side: Secondary | ICD-10-CM | POA: Diagnosis not present

## 2017-03-29 DIAGNOSIS — I10 Essential (primary) hypertension: Secondary | ICD-10-CM | POA: Diagnosis not present

## 2017-03-29 DIAGNOSIS — I6930 Unspecified sequelae of cerebral infarction: Secondary | ICD-10-CM | POA: Diagnosis not present

## 2017-03-29 DIAGNOSIS — R531 Weakness: Secondary | ICD-10-CM | POA: Diagnosis not present

## 2017-03-29 LAB — GLUCOSE, CAPILLARY
Glucose-Capillary: 149 mg/dL — ABNORMAL HIGH (ref 65–99)
Glucose-Capillary: 184 mg/dL — ABNORMAL HIGH (ref 65–99)
Glucose-Capillary: 141 mg/dL — ABNORMAL HIGH (ref 65–99)
Glucose-Capillary: 150 mg/dL — ABNORMAL HIGH (ref 65–99)

## 2017-03-29 LAB — BASIC METABOLIC PANEL
Anion gap: 9 (ref 5–15)
BUN: 10 mg/dL (ref 6–20)
CO2: 26 mmol/L (ref 22–32)
Calcium: 8.7 mg/dL — ABNORMAL LOW (ref 8.9–10.3)
Chloride: 103 mmol/L (ref 101–111)
Creatinine, Ser: 0.57 mg/dL (ref 0.44–1.00)
GFR calc Af Amer: >60 mL/min (ref >60)
GFR calc non Af Amer: >60 mL/min (ref >60)
Glucose, Bld: 145 mg/dL — ABNORMAL HIGH (ref 65–99)
Potassium: 4.2 mmol/L (ref 3.5–5.1)
Sodium: 138 mmol/L (ref 135–145)

## 2017-03-29 LAB — CBC
HCT: 39.2 % (ref 36.0–46.0)
Hemoglobin: 13 g/dL (ref 12.0–15.0)
MCH: 30.1 pg (ref 26.0–34.0)
MCHC: 33.2 g/dL (ref 30.0–36.0)
MCV: 90.7 fL (ref 78.0–100.0)
Platelets: 228 10*3/uL (ref 150–400)
RBC: 4.32 MIL/uL (ref 3.87–5.11)
RDW: 12.8 % (ref 11.5–15.5)
WBC: 5.4 10*3/uL (ref 4.0–10.5)

## 2017-03-29 LAB — PROTIME-INR
INR: 1.8
Prothrombin Time: 20.8 seconds — ABNORMAL HIGH (ref 11.4–15.2)

## 2017-03-29 LAB — TSH: TSH: 1.138 u[IU]/mL (ref 0.350–4.500)

## 2017-03-29 MED ORDER — BISACODYL 10 MG RE SUPP
10.0000 mg | Freq: Once | RECTAL | Status: AC
  Administered 2017-03-29: 10 mg via RECTAL
  Filled 2017-03-29: qty 1

## 2017-03-29 MED ORDER — SENNOSIDES-DOCUSATE SODIUM 8.6-50 MG PO TABS
2.0000 | ORAL_TABLET | Freq: Two times a day (BID) | ORAL | Status: DC
  Administered 2017-03-29 – 2017-03-30 (×3): 2 via ORAL
  Filled 2017-03-29 (×3): qty 2

## 2017-03-29 MED ORDER — SODIUM CHLORIDE 0.9 % IV SOLN
INTRAVENOUS | Status: AC
  Administered 2017-03-29 – 2017-03-30 (×2): via INTRAVENOUS

## 2017-03-29 MED ORDER — WARFARIN SODIUM 5 MG PO TABS
5.0000 mg | ORAL_TABLET | Freq: Once | ORAL | Status: AC
  Administered 2017-03-29: 5 mg via ORAL
  Filled 2017-03-29: qty 1

## 2017-03-29 NOTE — Progress Notes (Signed)
ANTICOAGULATION CONSULT NOTE -  Pharmacy Consult for Coumadin (home med) Indication: VTE treatment  Allergies  Allergen Reactions  . Sulfa Antibiotics Rash  . Sulfonamide Derivatives Rash    REACTION: rash   Patient Measurements: Height: 5\' 4"  (162.6 cm) Weight: 154 lb 5.2 oz (70 kg) IBW/kg (Calculated) : 54.7  Vital Signs: Temp: 98.4 F (36.9 C) (03/07 0633) Temp Source: Oral (03/07 6160) BP: 132/74 (03/07 7371) Pulse Rate: 66 (03/07 0633)  Labs: Recent Labs    03/28/17 0952 03/29/17 0450  HGB 13.6 13.0  HCT 41.9 39.2  PLT 241 228  LABPROT 20.2* 20.8*  INR 1.74 1.80  CREATININE 0.74 0.57  TROPONINI <0.03  --    Estimated Creatinine Clearance: 61.9 mL/min (by C-G formula based on SCr of 0.57 mg/dL).  Medical History: Past Medical History:  Diagnosis Date  . Anxiety   . Arthritis   . Depression    History of psychosis and previous suicide attempt  . DVT, lower extremity, recurrent (HCC)    Long-term Coumadin per Dr. Legrand Rams  . Essential hypertension   . GERD (gastroesophageal reflux disease)   . Hemiplegia ([place]) 2010   Left side  . History of stroke    Acute infarct and right cerebral white matter small vessel disease 12/10  . Leg DVT (deep venous thromboembolism), acute ([place]) 2006  . Schizophrenia ([place])   . Stroke [facility])    left sided weakness  . Type 2 diabetes mellitus (HCC)    Medications:  Medications Prior to Admission  Medication Sig Dispense Refill Last Dose  . acetaminophen (TYLENOL) 325 MG tablet Take 650 mg by mouth every 6 (six) hours as needed. For fever > 101    Unknown  . ARIPiprazole (ABILIFY) 2 MG tablet Take 2 mg by mouth daily.    03/28/2017 at Unknown time  . calcium carbonate (TUMS - DOSED IN MG ELEMENTAL CALCIUM) 500 MG chewable tablet Chew 1 tablet by mouth 4 (four) times daily as needed for indigestion or heartburn.    Unknown  . clonazePAM (KLONOPIN) 0.5 MG tablet Take 1 tablet (0.5 mg total) by mouth 4 (four) times daily. 30 tablet  0 03/28/2017 at Unknown time  . DULoxetine (CYMBALTA) 60 MG capsule Take 120 mg by mouth daily.    03/28/2017 at Unknown time  . HYDROcodone-acetaminophen (NORCO/VICODIN) 5-325 MG tablet Take 1 tablet by mouth 3 (three) times daily. 30 tablet 0 03/28/2017 at Unknown time  . Insulin Aspart (NOVOLOG FLEXPEN [place]) Inject 15-21 Units into the skin 3 (three) times daily before meals.   03/28/2017 at Unknown time  . iron polysaccharides (NIFEREX) 150 MG capsule Take 1 capsule (150 mg total) by mouth daily. 30 capsule 11 03/28/2017 at Unknown time  . latanoprost (XALATAN) 0.005 % ophthalmic solution Place 1 drop into both eyes at bedtime.   03/27/2017 at Unknown time  . LEVEMIR FLEXTOUCH 100 UNIT/ML Pen INJECT 50 UNITS SUBCUTANEOUSLY AT BEDTIME. 15 mL 3 03/27/2017 at Unknown time  . loperamide (IMODIUM) 2 MG capsule Take 4 mg by mouth daily as needed for diarrhea or loose stools. Take 2 capsules initially then take 2 after each loose stool per [facility]   03/27/2017 at Unknown time  . metoCLOPramide (REGLAN) 5 MG tablet Take 1 tablet (5 mg total) by mouth every 8 (eight) hours as needed for nausea or vomiting (or bloating). 5 tablet 0 03/27/2017 at Unknown time  . metoprolol succinate (TOPROL-XL) 25 MG 24 hr tablet Take 12.5 mg by mouth 2 (two) times  daily.    03/28/2017 at 700  . mirtazapine (REMERON) 30 MG tablet Take 30 mg by mouth at bedtime.     03/27/2017 at Unknown time  . naloxegol oxalate (MOVANTIK) 12.5 MG TABS tablet Take 12.5 mg by mouth daily.   03/28/2017 at Unknown time  . NOVOFINE AUTOCOVER 30G X 8 MM MISC    03/28/2017 at Unknown time  . NOVOLOG FLEXPEN 100 UNIT/ML FlexPen INJECT 15 UNITS SUBCUTANEOUSLY BEFORE MEALS IF BS IS BETWEEN 90-150. 15 mL 2 03/28/2017 at Unknown time  . omeprazole (PRILOSEC) 40 MG capsule Take 40 mg by mouth daily.   03/28/2017 at Unknown time  . ondansetron (ZOFRAN) 4 MG tablet Take 1 tablet (4 mg total) by mouth 4 (four) times daily -  before meals and at bedtime. 120 tablet 3 03/27/2017 at Unknown time   . polyethylene glycol powder (GLYCOLAX/MIRALAX) powder Take 17 g by mouth daily as needed.   03/27/2017 at Unknown time  . polyvinyl alcohol (ARTIFICIAL TEARS) 1.4 % ophthalmic solution Place 1 drop into both eyes 3 (three) times daily.   Unknown  . promethazine (PHENERGAN) 12.5 MG tablet TAKE 1 TABLET BY MOUTH EVERY 6 HOURS AS NEEDED FOR NAUSEA OR VOMITING. 30 tablet 0 Taking  . risperiDONE (RISPERDAL) 3 MG tablet Take 3 mg by mouth 2 (two) times daily.    03/28/2017 at Unknown time  . simvastatin (ZOCOR) 20 MG tablet Take 20 mg by mouth at bedtime.    03/27/2017 at Unknown time  . solifenacin (VESICARE) 5 MG tablet Take 5 mg by mouth daily.   03/28/2017 at Unknown time  . tiZANidine (ZANAFLEX) 2 MG tablet Take 2 mg by mouth daily.   03/28/2017 at Unknown time  . Vitamin D, Ergocalciferol, (DRISDOL) 50000 UNITS CAPS capsule Take 50,000 Units by mouth every Friday.    Past Week at Unknown time  . warfarin (COUMADIN) 5 MG tablet TAKE 1/2 TABLET (2.5mg ) BY MOUTH ONCE DAILY ON MON,WED,FRI & SATURDAY. TAKE 1 TABLET (5mg ) ON SUNDAY, TUES,& THURS. 30 tablet 3 03/28/2017 at 700  . zolpidem (AMBIEN) 10 MG tablet Take 1 tablet (10 mg total) by mouth at bedtime. 5 tablet 0 03/27/2017 at Unknown time   Assessment: 72yo female on Coumadin PTA.  INR is below target.  Pt reportedly took home dose of 2.5mg  today.  Will give 5mg  dose due to INR below target.    Goal of Therapy:  INR 2-3 Monitor platelets by anticoagulation protocol: Yes   Plan:  Coumadin 5mg  x 1 today INR daily  Hart Robinsons A 03/29/2017,11:29 AM

## 2017-03-29 NOTE — Care Management Note (Signed)
Case Management Note  Patient Details  Name: [name] MRN: [id number] Date of Birth: [birth year]  Subjective/Objective:   Adm for general weakness. From [facility]. Walks with RW, also has WC. Reports she started [facility] service for PT last week. Unsure of HHA.                  Action/Plan: Anticipate DC back to [facility] with resumption of HH. Patient is in OBS and will not need resumption orders.  Expected Discharge Date:    03/30/2017              Expected Discharge Plan:  Assisted Living / Rest Home  In-House Referral:  Clinical Social Work  Discharge planning Services  CM Consult  Post Acute Care Choice:  Home Health, Resumption of Svcs/PTA Provider Choice offered to:  Patient  DME Arranged:    DME Agency:     HH Arranged:    [place] Agency:     Status of Service:  Completed, signed off  If discussed at [employer], dates discussed:    Additional Comments:  Jacqui Headen, Chauncey Reading, RN 03/29/2017, 8:54 AM

## 2017-03-29 NOTE — Progress Notes (Signed)
Pt refused ortho vitals this am. She did not want to get out of bed.

## 2017-03-29 NOTE — Clinical Social Work Note (Signed)
Clinical Social Work Assessment  Patient Details  Name: [name] MRN: [id number] Date of Birth: 20-Jun-1945  Date of referral:  03/29/17               Reason for consult:  Other (Comment Required)(From [facility])                Permission sought to share information with:    Permission granted to share information::     Name::        Agency::  [name] at [facility]  Relationship::     Contact Information:     Housing/Transportation Living arrangements for the past 2 months:  [name] of Information:  Facility Patient Interpreter Needed:  None Criminal Activity/Legal Involvement Pertinent to Current Situation/Hospitalization:  Yes Significant Relationships:  Other(Comment), Adult Children([facility]) Lives with:  Facility Resident Do you feel safe going back to the place where you live?  Yes Need for family participation in patient care:  Yes (Comment)  Care giving concerns:  None identified. Facility resident.    Social Worker assessment / plan:  Patient has been a resident at the facility since 03/08/09. Patient has a legal guardian, [name], who is with [facility], she is aware of this hospitalization.  Staff assist with bathing, dressing, toileting and wears depends.  Patient uses a wheelchair and a walker with and hands on assistance.  Patient can return at discharge.   Employment status:  Retired Forensic scientist:  Medicare PT Recommendations:  Not assessed at this time Information / Referral to community resources:     Patient/Family's Response to care:  Patient's legal  Guardian is aware of patient's hospitalization.  Patient/Family's Understanding of and Emotional Response to Diagnosis, Current Treatment, and Prognosis:  LCSW will speak with legal guardian to discuss diagnosis, treatment and prognosis.  Emotional Assessment Appearance:  Appears stated age Attitude/Demeanor/Rapport:    Affect (typically observed):  Unable to  Assess Orientation:  Oriented to Self, Oriented to Place, Oriented to  Time, Oriented to Situation Alcohol / Substance use:  Not Applicable Psych involvement (Current and /or in the community):  No (Comment)  Discharge Needs  Concerns to be addressed:  Other (Comment Required(return to [facility]) Readmission within the last 30 days:  Yes Current discharge risk:  None Barriers to Discharge:  No Barriers Identified   Courtney Gully, LCSW 03/29/2017, 4:09 PM

## 2017-03-29 NOTE — Care Management Obs Status (Signed)
[place] NOTIFICATION   Patient Details  Name: PAMI WOOL MRN: [id number] Date of Birth: [birth year]   Medicare Observation Status Notification Given:  Yes    Kavonte Bearse, Chauncey Reading, RN 03/29/2017, 8:53 AM

## 2017-03-29 NOTE — Progress Notes (Signed)
PROGRESS NOTE    [name]  XTG:[id number]  DOB: 1945/05/13  DOA: 03/28/2017 PCP: [name], MD   Brief Admission Hx: [name] is a 72 y.o. female with medical history significant for history of CVA with left-sided weakness, history of DVT on warfarin, schizophrenia, insulin-dependent diabetes mellitus, and history of hypertension, now presenting to the emergency department for evaluation of generalized weakness, lightheadedness on standing, and hypotension in her endocrinologist office just prior to arrival.   MDM/Assessment & Plan:   1. Generalized weakness; orthostasis  - Presents with 2 weeks of progressive gen weakness, lightheaded on standing, found to have BP 70/38 in endocrinology clinic just prior to arrival in ED  - Likely secondary to recent diarrhea that has resolved with cessation of laxatives and prn loperamide   - No evidence for infectious etiology  - Hold Toprol, continue IVF hydration overnight, hopefully can discharge home in AM.    2. Hx of hypertension  - BP was 70/38 at endocrinology clinic just prior to arrival in ED and she is orthostatic in ED  - Hold Toprol, hydrate as above    3. Hx of DVT  - No evidence for acute VTE  - Continue warfarin with pharmacist assistance.     4. Hx of CVA with left-sided weakness  - Stable left-sided weakness, no acute deficits identified  - Continue statin and anticoagulation    5. Schizophrenia  - Denies SI, HI, or active hallucinations   - Continue Risperdal, Abilify, Remeron, Cymbalta, and Klonopin    6. Insulin-dependent DM   - A1c was 6.5% last month  - Managed at home with Levemir 50 units qHS and Novolog TID per sliding-scale  - She has had poor appetite recently, she is still not eating very well.  - Started Levemir with 30 units qHS and Novolog per sliding-scale while in hospital    CBG (last 3)  Recent Labs    03/28/17 2218 03/29/17 0739 03/29/17 1119  GLUCAP 142* 149* 184*   7.  Constipation - moderate stool noted throughout colon, laxatives ordered.    DVT prophylaxis: warfarin  Code Status: Full  Family Communication: Discussed with patient Consults called: None Disposition: Continue IVFs today and plan to discharge home in AM, she is not eating and drinking well at this time  Subjective: Pt slowly starting to feel better, but not eating and drinking well.    Objective: Vitals:   03/28/17 1815 03/28/17 1948 03/28/17 2212 03/29/17 0633  BP: (!) 153/58  (!) 142/63 132/74  Pulse: 72  68 66  Resp: 18  18 18   Temp: 98 F (36.7 C)  98.5 F (36.9 C) 98.4 F (36.9 C)  TempSrc: Oral  Oral Oral  SpO2: 95% 95% 95% 94%  Weight: 70 kg (154 lb 5.2 oz)     Height: 5\' 4"  (1.626 m)       Intake/Output Summary (Last 24 hours) at 03/29/2017 1356 Last data filed at 03/29/2017 0300 Gross per 24 hour  Intake 940.5 ml  Output -  Net 940.5 ml   Filed Weights   03/28/17 0936 03/28/17 1815  Weight: 71.7 kg (158 lb) 70 kg (154 lb 5.2 oz)   REVIEW OF SYSTEMS  As per history otherwise all reviewed and reported negative  Exam:  General exam: awake, alert, NAD, cooperative. Dry mucus membranes.  Respiratory system: Clear. No increased work of breathing. Cardiovascular system: S1 & S2 heard, RRR. No JVD, murmurs, gallops, clicks or pedal edema. Gastrointestinal system:  Abdomen is nondistended, soft and nontender. Normal bowel sounds heard. Central nervous system: Alert and oriented. No focal neurological deficits. Extremities: no CCE.  Data Reviewed: Basic Metabolic Panel: Recent Labs  Lab 03/28/17 0952 03/29/17 0450  NA 137 138  K 3.8 4.2  CL 99* 103  CO2 28 26  GLUCOSE 144* 145*  BUN 14 10  CREATININE 0.74 0.57  CALCIUM 9.0 8.7*   Liver Function Tests: Recent Labs  Lab 03/28/17 0952  AST 14*  ALT 12*  ALKPHOS 81  BILITOT 0.5  PROT 7.3  ALBUMIN 3.9   Recent Labs  Lab 03/28/17 0952  LIPASE 20   No results for input(s): AMMONIA in the last 168  hours. CBC: Recent Labs  Lab 03/28/17 0952 03/29/17 0450  WBC 5.9 5.4  NEUTROABS 3.5  --   HGB 13.6 13.0  HCT 41.9 39.2  MCV 90.7 90.7  PLT 241 228   Cardiac Enzymes: Recent Labs  Lab 03/28/17 0952  TROPONINI <0.03   CBG (last 3)  Recent Labs    03/28/17 2218 03/29/17 0739 03/29/17 1119  GLUCAP 142* 149* 184*   Recent Results (from the past 240 hour(s))  MRSA PCR Screening     Status: None   Collection Time: 03/28/17  8:46 PM  Result Value Ref Range Status   MRSA by PCR NEGATIVE NEGATIVE Final    Comment:        The GeneXpert MRSA Assay (FDA approved for NASAL specimens only), is one component of a comprehensive MRSA colonization surveillance program. It is not intended to diagnose MRSA infection nor to guide or monitor treatment for MRSA infections. Performed at [facility], [address], [address], [place] [address]      Studies: Dg Chest 2 View  Result Date: 03/28/2017 CLINICAL DATA:  Generalized weakness upon awakening this morning. Hypotension, diabetes, former smoker. EXAM: CHEST - 2 VIEW COMPARISON:  Portable chest x-ray of October 06, 2016 and PA and lateral chest x-ray of February 13, 2016 and December 27, 2010. FINDINGS: The right lung is well-expanded and clear. The left lung also appears clear. The cardiac silhouette is enlarged. The pulmonary vascularity is not engorged. There is stable dextrocurvature centered in the midthoracic spine. There is calcification in the wall of the aortic arch. There is no pleural effusion. There is a stable calcified nodule projecting over the lower thoracic spine which may reflect a bone island or a calcified nodule. It is unchanged since December 27, 2010. IMPRESSION: There is no acute cardiopulmonary disease. Stable cardiomegaly. Probable COPD-smoking related changes. Thoracic aortic atherosclerosis. Electronically Signed   By: David  Martinique M.D.   On: 03/28/2017 10:27   Ct Abdomen Pelvis W Contrast  Result Date:  03/28/2017 CLINICAL DATA:  Dark stools and abdominal tenderness x 2 weeks; Pt resident of [facility]. Pt C/o generalized weakness since waking this morning. EXAM: CT ABDOMEN AND PELVIS WITH CONTRAST TECHNIQUE: Multidetector CT imaging of the abdomen and pelvis was performed using the standard protocol following bolus administration of intravenous contrast. CONTRAST:  162mL ISOVUE-300 IOPAMIDOL (ISOVUE-300) INJECTION 61% COMPARISON:  03/11/2016, 02/23/2010 FINDINGS: Lower chest: 12 mm centrally calcified right lower lobe pulmonary nodule unchanged compared with 02/23/2010 most consistent with sequela prior granulomatous disease. Hepatobiliary: 3.3 x 3.5 cm mass in the right hepatic lobe peripherally with peripheral nodular centripetal enhancement most consistent with a hemangioma. No other hepatic mass. Cholelithiasis. No pericholecystic fluid or inflammatory changes. No intrahepatic or extrahepatic biliary ductal dilatation. Pancreas: Unremarkable. No pancreatic ductal dilatation  or surrounding inflammatory changes. Spleen: Normal in size without focal abnormality. Adrenals/Urinary Tract: Adrenal glands are unremarkable. Kidneys are normal, without renal calculi, focal lesion, or hydronephrosis. Bladder is unremarkable. Stomach/Bowel: No pneumatosis, pneumoperitoneum or portal venous gas. No bowel obstruction. No bowel wall thickening or inflammatory changes. Moderate amount of stool throughout the colon. Normal appendix. Vascular/Lymphatic: Abdominal aortic atherosclerosis. Normal caliber abdominal aorta. No lymphadenopathy. Reproductive: 5.3 x 3.8 cm hypodense, fluid attenuating left ovarian mass most consistent with a cyst. No other adnexal mass. Normal uterus. Other: No abdominal wall hernia or abnormality. No abdominopelvic ascites. Focal area of inflammatory changes in the right anterior abdominal wall measuring approximately 14 mm most consistent with an area of fat necrosis. Musculoskeletal: No acute osseous  abnormality. No aggressive osseous lesion. Grade 1 anterolisthesis of L4 on L5. Severe bilateral facet arthropathy L4-5 and broad-based disc bulge with overall severe spinal stenosis at L4-5. Degenerative disc disease with disc height loss and bilateral moderate facet arthropathy at L5-S1. IMPRESSION: 1. No acute intraabdominal or pelvic pathology. 2. Focal area of inflammatory changes in the right anterior abdominal wall measuring approximately 14 mm most consistent with an area of fat necrosis. 3. Moderate amount of stool throughout the colon. 4. Multifactorial L4-5 severe spinal stenosis. Electronically Signed   By: Kathreen Devoid   On: 03/28/2017 11:04   Scheduled Meds: . ARIPiprazole  2 mg Oral Daily  . clonazePAM  0.5 mg Oral QID  . darifenacin  7.5 mg Oral Daily  . DULoxetine  120 mg Oral Daily  . HYDROcodone-acetaminophen  1 tablet Oral TID  . insulin aspart  0-5 Units Subcutaneous QHS  . insulin aspart  0-9 Units Subcutaneous TID WC  . insulin detemir  30 Units Subcutaneous QHS  . latanoprost  1 drop Both Eyes QHS  . mirtazapine  30 mg Oral QHS  . pantoprazole  40 mg Oral Daily  . polyvinyl alcohol  1 drop Both Eyes TID  . risperiDONE  3 mg Oral BID  . simvastatin  20 mg Oral QHS  . tiZANidine  2 mg Oral Daily  . warfarin  5 mg Oral Once  . Warfarin - Pharmacist Dosing Inpatient   Does not apply Q24H  . zolpidem  5 mg Oral QHS   Continuous Infusions:  Principal Problem:   General weakness Active Problems:   Type 2 diabetes mellitus with vascular disease (HCC)   Essential hypertension, benign   DVT, lower extremity, recurrent (HCC)   Left hemiparesis (HCC)   Orthostasis   History of cerebrovascular accident (CVA) with residual deficit   Schizophrenia ([place])   Generalized weakness   Time spent:   Irwin Brakeman, MD, FAAFP [facility] Pager [phone]  If 7PM-7AM, please contact night-coverage [email or web address] Password [email or web address] 03/29/2017, 1:56 PM    LOS: 0 days

## 2017-03-30 DIAGNOSIS — I82409 Acute embolism and thrombosis of unspecified deep veins of unspecified lower extremity: Secondary | ICD-10-CM | POA: Diagnosis not present

## 2017-03-30 DIAGNOSIS — I6930 Unspecified sequelae of cerebral infarction: Secondary | ICD-10-CM | POA: Diagnosis not present

## 2017-03-30 DIAGNOSIS — G8194 Hemiplegia, unspecified affecting left nondominant side: Secondary | ICD-10-CM | POA: Diagnosis not present

## 2017-03-30 DIAGNOSIS — I10 Essential (primary) hypertension: Secondary | ICD-10-CM | POA: Diagnosis not present

## 2017-03-30 DIAGNOSIS — E1159 Type 2 diabetes mellitus with other circulatory complications: Secondary | ICD-10-CM | POA: Diagnosis not present

## 2017-03-30 DIAGNOSIS — R531 Weakness: Secondary | ICD-10-CM | POA: Diagnosis not present

## 2017-03-30 DIAGNOSIS — I951 Orthostatic hypotension: Secondary | ICD-10-CM | POA: Diagnosis not present

## 2017-03-30 LAB — URINE CULTURE

## 2017-03-30 LAB — PROTIME-INR
INR: 1.92
Prothrombin Time: 21.8 seconds — ABNORMAL HIGH (ref 11.4–15.2)

## 2017-03-30 LAB — GLUCOSE, CAPILLARY
Glucose-Capillary: 146 mg/dL — ABNORMAL HIGH (ref 65–99)
Glucose-Capillary: 132 mg/dL — ABNORMAL HIGH (ref 65–99)

## 2017-03-30 MED ORDER — POLYETHYLENE GLYCOL 3350 17 GM/SCOOP PO POWD: 17.0000 g | Freq: Every day | ORAL | 0 refills | Status: DC

## 2017-03-30 MED ORDER — HYDROCODONE-ACETAMINOPHEN 5-325 MG PO TABS: 1.0000 | ORAL_TABLET | Freq: Three times a day (TID) | ORAL | 0 refills | Status: DC

## 2017-03-30 MED ORDER — INSULIN DETEMIR 100 UNIT/ML FLEXPEN: 30.0000 [IU] | PEN_INJECTOR | Freq: Every day | SUBCUTANEOUS | 3 refills | Status: DC

## 2017-03-30 MED ORDER — CLONAZEPAM 0.5 MG PO TABS: 0.5000 mg | ORAL_TABLET | Freq: Four times a day (QID) | ORAL | 0 refills | Status: DC

## 2017-03-30 MED ORDER — WARFARIN SODIUM 2.5 MG PO TABS: 2.5000 mg | ORAL_TABLET | Freq: Once | ORAL | Status: DC

## 2017-03-30 MED ORDER — OXYCODONE HCL 5 MG PO TABS
5.0000 mg | ORAL_TABLET | Freq: Once | ORAL | Status: AC
  Administered 2017-03-30: 5 mg via ORAL
  Filled 2017-03-30: qty 1

## 2017-03-30 MED ORDER — METOPROLOL SUCCINATE ER 25 MG PO TB24: 12.5000 mg | ORAL_TABLET | Freq: Every day | ORAL | Status: DC

## 2017-03-30 NOTE — Progress Notes (Signed)
ANTICOAGULATION CONSULT NOTE -  Pharmacy Consult for Coumadin (home med) Indication: VTE treatment  Allergies  Allergen Reactions  . Sulfa Antibiotics Rash  . Sulfonamide Derivatives Rash    REACTION: rash   Patient Measurements: Height: 5\' 4"  (162.6 cm) Weight: 154 lb 5.2 oz (70 kg) IBW/kg (Calculated) : 54.7  Vital Signs: Temp: 98.3 F (36.8 C) (03/08 0500) Temp Source: Oral (03/08 0500) BP: 124/56 (03/08 0500) Pulse Rate: 91 (03/08 0500)  Labs: Recent Labs    03/28/17 0952 03/29/17 0450 03/30/17 0637  HGB 13.6 13.0  --   HCT 41.9 39.2  --   PLT 241 228  --   LABPROT 20.2* 20.8* 21.8*  INR 1.74 1.80 1.92  CREATININE 0.74 0.57  --   TROPONINI <0.03  --   --    Estimated Creatinine Clearance: 61.9 mL/min (by C-G formula based on SCr of 0.57 mg/dL).  Medical History: Past Medical History:  Diagnosis Date  . Anxiety   . Arthritis   . Depression    History of psychosis and previous suicide attempt  . DVT, lower extremity, recurrent (HCC)    Long-term Coumadin per Dr. Legrand Rams  . Essential hypertension   . GERD (gastroesophageal reflux disease)   . Hemiplegia ([place]) 2010   Left side  . History of stroke    Acute infarct and right cerebral white matter small vessel disease 12/10  . Leg DVT (deep venous thromboembolism), acute ([place]) 2006  . Schizophrenia ([place])   . Stroke [facility])    left sided weakness  . Type 2 diabetes mellitus (HCC)    Medications:  Medications Prior to Admission  Medication Sig Dispense Refill Last Dose  . acetaminophen (TYLENOL) 325 MG tablet Take 650 mg by mouth every 6 (six) hours as needed. For fever > 101    Unknown  . ARIPiprazole (ABILIFY) 2 MG tablet Take 2 mg by mouth daily.    03/28/2017 at Unknown time  . calcium carbonate (TUMS - DOSED IN MG ELEMENTAL CALCIUM) 500 MG chewable tablet Chew 1 tablet by mouth 4 (four) times daily as needed for indigestion or heartburn.    Unknown  . clonazePAM (KLONOPIN) 0.5 MG tablet Take 1 tablet (0.5  mg total) by mouth 4 (four) times daily. 30 tablet 0 03/28/2017 at Unknown time  . DULoxetine (CYMBALTA) 60 MG capsule Take 120 mg by mouth daily.    03/28/2017 at Unknown time  . HYDROcodone-acetaminophen (NORCO/VICODIN) 5-325 MG tablet Take 1 tablet by mouth 3 (three) times daily. 30 tablet 0 03/28/2017 at Unknown time  . Insulin Aspart (NOVOLOG FLEXPEN [place]) Inject 15-21 Units into the skin 3 (three) times daily before meals.   03/28/2017 at Unknown time  . iron polysaccharides (NIFEREX) 150 MG capsule Take 1 capsule (150 mg total) by mouth daily. 30 capsule 11 03/28/2017 at Unknown time  . latanoprost (XALATAN) 0.005 % ophthalmic solution Place 1 drop into both eyes at bedtime.   03/27/2017 at Unknown time  . LEVEMIR FLEXTOUCH 100 UNIT/ML Pen INJECT 50 UNITS SUBCUTANEOUSLY AT BEDTIME. 15 mL 3 03/27/2017 at Unknown time  . loperamide (IMODIUM) 2 MG capsule Take 4 mg by mouth daily as needed for diarrhea or loose stools. Take 2 capsules initially then take 2 after each loose stool per [facility]   03/27/2017 at Unknown time  . metoCLOPramide (REGLAN) 5 MG tablet Take 1 tablet (5 mg total) by mouth every 8 (eight) hours as needed for nausea or vomiting (or bloating). 5 tablet 0 03/27/2017 at Unknown  time  . metoprolol succinate (TOPROL-XL) 25 MG 24 hr tablet Take 12.5 mg by mouth 2 (two) times daily.    03/28/2017 at 700  . mirtazapine (REMERON) 30 MG tablet Take 30 mg by mouth at bedtime.     03/27/2017 at Unknown time  . naloxegol oxalate (MOVANTIK) 12.5 MG TABS tablet Take 12.5 mg by mouth daily.   03/28/2017 at Unknown time  . NOVOFINE AUTOCOVER 30G X 8 MM MISC    03/28/2017 at Unknown time  . NOVOLOG FLEXPEN 100 UNIT/ML FlexPen INJECT 15 UNITS SUBCUTANEOUSLY BEFORE MEALS IF BS IS BETWEEN 90-150. 15 mL 2 03/28/2017 at Unknown time  . omeprazole (PRILOSEC) 40 MG capsule Take 40 mg by mouth daily.   03/28/2017 at Unknown time  . ondansetron (ZOFRAN) 4 MG tablet Take 1 tablet (4 mg total) by mouth 4 (four) times daily -  before meals and  at bedtime. 120 tablet 3 03/27/2017 at Unknown time  . polyethylene glycol powder (GLYCOLAX/MIRALAX) powder Take 17 g by mouth daily as needed.   03/27/2017 at Unknown time  . polyvinyl alcohol (ARTIFICIAL TEARS) 1.4 % ophthalmic solution Place 1 drop into both eyes 3 (three) times daily.   Unknown  . promethazine (PHENERGAN) 12.5 MG tablet TAKE 1 TABLET BY MOUTH EVERY 6 HOURS AS NEEDED FOR NAUSEA OR VOMITING. 30 tablet 0 Taking  . risperiDONE (RISPERDAL) 3 MG tablet Take 3 mg by mouth 2 (two) times daily.    03/28/2017 at Unknown time  . simvastatin (ZOCOR) 20 MG tablet Take 20 mg by mouth at bedtime.    03/27/2017 at Unknown time  . solifenacin (VESICARE) 5 MG tablet Take 5 mg by mouth daily.   03/28/2017 at Unknown time  . tiZANidine (ZANAFLEX) 2 MG tablet Take 2 mg by mouth daily.   03/28/2017 at Unknown time  . Vitamin D, Ergocalciferol, (DRISDOL) 50000 UNITS CAPS capsule Take 50,000 Units by mouth every Friday.    Past Week at Unknown time  . warfarin (COUMADIN) 5 MG tablet TAKE 1/2 TABLET (2.5mg ) BY MOUTH ONCE DAILY ON MON,WED,FRI & SATURDAY. TAKE 1 TABLET (5mg ) ON SUNDAY, TUES,& THURS. 30 tablet 3 03/28/2017 at 700  . zolpidem (AMBIEN) 10 MG tablet Take 1 tablet (10 mg total) by mouth at bedtime. 5 tablet 0 03/27/2017 at Unknown time   Assessment: 72yo female on Coumadin PTA.  INR 1.92 is just below goal, but trending up.  Home regimen: 2.5mg  MWF Sat, takes 5mg  T,Th, Sun   Goal of Therapy:  INR 2-3 Monitor platelets by anticoagulation protocol: Yes   Plan:  Coumadin 2.5mg  x 1 today INR daily Monitor for S/S of bleeding  Isac Sarna, BS [place], BCPS Clinical Pharmacist Pager [phone] 03/30/2017,8:38 AM

## 2017-03-30 NOTE — Discharge Instructions (Signed)
Follow with Primary MD  Courtney Fire, MD  and other consultants as instructed your Hospitalist MD  Please get a complete blood count and chemistry panel checked by your Primary MD at your next visit, and again as instructed by your Primary MD.  Get Medicines reviewed and adjusted: Please take all your medications with you for your next visit with your Primary MD  Laboratory/radiological data: Please request your Primary MD to go over all hospital tests and procedure/radiological results at the follow up, please ask your Primary MD to get all Hospital records sent to his/her office.  In some cases, they will be blood work, cultures and biopsy results pending at the time of your discharge. Please request that your primary care M.D. follows up on these results.  Also Note the following: If you experience worsening of your admission symptoms, develop shortness of breath, life threatening emergency, suicidal or homicidal thoughts you must seek medical attention immediately by calling 911 or calling your MD immediately  if symptoms less severe.  You must read complete instructions/literature along with all the possible adverse reactions/side effects for all the Medicines you take and that have been prescribed to you. Take any new Medicines after you have completely understood and accpet all the possible adverse reactions/side effects.   Do not drive when taking Pain medications or sleeping medications (Benzodaizepines)  Do not take more than prescribed Pain, Sleep and Anxiety Medications. It is not advisable to combine anxiety,sleep and pain medications without talking with your primary care practitioner  Special Instructions: If you have smoked or chewed Tobacco  in the last 2 yrs please stop smoking, stop any regular Alcohol  and or any Recreational drug use.  Wear Seat belts while driving.  Please note: You were cared for by a hospitalist during your hospital stay. Once you are discharged,  your primary care physician will handle any further medical issues. Please note that NO REFILLS for any discharge medications will be authorized once you are discharged, as it is imperative that you return to your primary care physician (or establish a relationship with a primary care physician if you do not have one) for your post hospital discharge needs so that they can reassess your need for medications and monitor your lab values.      Hypotension Hypotension, commonly called low blood pressure, is when the force of blood pumping through your arteries is too weak. Arteries are blood vessels that carry blood from the heart throughout the body. When blood pressure is too low, you may not get enough blood to your brain or to the rest of your organs. This can cause weakness, light-headedness, rapid heartbeat, and fainting. Depending on the cause and severity, hypotension may be harmless (benign) or cause serious problems (critical). What are the causes? Possible causes of hypotension include:  Blood loss.  Loss of body fluids (dehydration).  Heart problems.  Hormone (endocrine) problems.  Pregnancy.  Severe infection.  Lack of certain nutrients.  Severe allergic reactions (anaphylaxis).  Certain medicines, such as blood pressure medicine or medicines that make the body lose excess fluids (diuretics). Sometimes, hypotension can be caused by not taking medicine as directed, such as taking too much of a certain medicine.  What increases the risk? Certain factors can make you more likely to develop hypotension, including:  Age. Risk increases as you get older.  Conditions that affect the heart or the central nervous system.  Taking certain medicines, such as blood pressure medicine or diuretics.  Being  pregnant.  What are the signs or symptoms? Symptoms of this condition may include:  Weakness.  Light-headedness.  Dizziness.  Blurred vision.  Fatigue.  Rapid  heartbeat.  Fainting, in severe cases.  How is this diagnosed? This condition is diagnosed based on:  Your medical history.  Your symptoms.  Your blood pressure measurement. Your health care provider will check your blood pressure when you are: ? Lying down. ? Sitting. ? Standing.  A blood pressure reading is recorded as two numbers, such as "120 over 80" (or 120/80). The first ("top") number is called the systolic pressure. It is a measure of the pressure in your arteries as your heart beats. The second ("bottom") number is called the diastolic pressure. It is a measure of the pressure in your arteries when your heart relaxes between beats. Blood pressure is measured in a unit called mm Hg. Healthy blood pressure for adults is 120/80. If your blood pressure is below 90/60, you may be diagnosed with hypotension. Other information or tests that may be used to diagnose hypotension include:  Your other vital signs, such as your heart rate and temperature.  Blood tests.  Tilt table test. For this test, you will be safely secured to a table that moves you from a lying position to an upright position. Your heart rhythm and blood pressure will be monitored during the test.  How is this treated? Treatment for this condition may include:  Changing your diet. This may involve eating more salt (sodium) or drinking more water.  Taking medicines to raise your blood pressure.  Changing the dosage of certain medicines you are taking that might be lowering your blood pressure.  Wearing compression stockings. These stockings help to prevent blood clots and reduce swelling in your legs.  In some cases, you may need to go to the hospital for:  Fluid replacement. This means you will receive fluids through an IV tube.  Blood replacement. This means you will receive donated blood through an IV tube (transfusion).  Treating an infection or heart problems, if this applies.  Monitoring. You may  need to be monitored while medicines that you are taking wear off.  Follow these instructions at home: Eating and drinking   Drink enough fluid to keep your urine clear or pale yellow.  Eat a healthy diet and follow instructions from your health care provider about eating or drinking restrictions. A healthy diet includes: ? Fresh fruits and vegetables. ? Whole grains. ? Lean meats. ? Low-fat dairy products.  Eat extra salt only as directed. Do not add extra salt to your diet unless your health care provider told you to do that.  Eat frequent, small meals.  Avoid standing up suddenly after eating. Medicines  Take over-the-counter and prescription medicines only as told by your health care provider. ? Follow instructions from your health care provider about changing the dosage of your current medicines, if this applies. ? Do not stop or adjust any of your medicines on your own. General instructions  Wear compression stockings as told by your health care provider.  Get up slowly from lying down or sitting positions. This gives your blood pressure a chance to adjust.  Avoid hot showers and excessive heat as directed by your health care provider.  Return to your normal activities as told by your health care provider. Ask your health care provider what activities are safe for you.  Do not use any products that contain nicotine or tobacco, such as cigarettes and  e-cigarettes. If you need help quitting, ask your health care provider.  Keep all follow-up visits as told by your health care provider. This is important. Contact a health care provider if:  You vomit.  You have diarrhea.  You have a fever for more than 2-3 days.  You feel more thirsty than usual.  You feel weak and tired. Get help right away if:  You have chest pain.  You have a fast or irregular heartbeat.  You develop numbness in any part of your body.  You cannot move your arms or your legs.  You have  trouble speaking.  You become sweaty or feel light-headed.  You faint.  You feel short of breath.  You have trouble staying awake.  You feel confused. This information is not intended to replace advice given to you by your health care provider. Make sure you discuss any questions you have with your health care provider. Document Released: 01/09/2005 Document Revised: 07/30/2015 Document Reviewed: 07/01/2015 Elsevier Interactive Patient Education  2018 [employer].   Fatigue Fatigue is feeling tired all of the time, a lack of energy, or a lack of motivation. Occasional or mild fatigue is often a normal response to activity or life in general. However, long-lasting (chronic) or extreme fatigue may indicate an underlying medical condition. Follow these instructions at home: Watch your fatigue for any changes. The following actions may help to lessen any discomfort you are feeling:  Talk to your health care provider about how much sleep you need each night. Try to get the required amount every night.  Take medicines only as directed by your health care provider.  Eat a healthy and nutritious diet. Ask your health care provider if you need help changing your diet.  Drink enough fluid to keep your urine clear or pale yellow.  Practice ways of relaxing, such as yoga, meditation, massage therapy, or acupuncture.  Exercise regularly.  Change situations that cause you stress. Try to keep your work and personal routine reasonable.  Do not abuse illegal drugs.  Limit alcohol intake to no more than 1 drink per day for nonpregnant women and 2 drinks per day for men. One drink equals 12 ounces of beer, 5 ounces of wine, or 1 ounces of hard liquor.  Take a multivitamin, if directed by your health care provider.  Contact a health care provider if:  Your fatigue does not get better.  You have a fever.  You have unintentional weight loss or gain.  You have headaches.  You have  difficulty: ? Falling asleep. ? Sleeping throughout the night.  You feel angry, guilty, anxious, or sad.  You are unable to have a bowel movement (constipation).  You skin is dry.  Your legs or another part of your body is swollen. Get help right away if:  You feel confused.  Your vision is blurry.  You feel faint or pass out.  You have a severe headache.  You have severe abdominal, pelvic, or back pain.  You have chest pain, shortness of breath, or an irregular or fast heartbeat.  You are unable to urinate or you urinate less than normal.  You develop abnormal bleeding, such as bleeding from the rectum, vagina, nose, lungs, or nipples.  You vomit blood.  You have thoughts about harming yourself or committing suicide.  You are worried that you might harm someone else. This information is not intended to replace advice given to you by your health care provider. Make sure you discuss  any questions you have with your health care provider. Document Released: 11/06/2006 Document Revised: 06/17/2015 Document Reviewed: 05/13/2013 Elsevier Interactive Patient Education  2018 [employer].   Weakness Weakness is a lack of strength. You may feel weak all over your body (generalized), or you may feel weak in one specific part of your body (focal). There are many potential causes of weakness. Sometimes, the cause of your weakness may not be known. Some causes of weakness can be serious, so it is important to see your doctor. Follow these instructions at home:  Rest as needed.  Try to get enough sleep. Talk to your doctor about how much sleep you need each night.  Take over-the-counter and prescription medicines only as told by your doctor.  Eat a healthy, well-balanced diet. This includes: ? Proteins to build muscles, such as lean meats and fish. ? Fresh fruits and vegetables. ? Carbohydrates to boost energy, such as whole grains.  Drink enough fluid to keep your pee  (urine) clear or pale yellow.  Do strength exercises, such as arm curls and leg raises, for 30 minutes at least 2 days a week or as told by your doctor.  Think about working with a physical therapist or trainer to help you get stronger.  Keep all follow-up visits as told by your doctor. This is important. Contact a doctor if:  Your weakness does not get better or it gets worse.  Your weakness affects your ability to: ? Think clearly. ? Do your normal daily activities. Get help right away if:  You have sudden weakness.  You have trouble breathing or shortness of breath.  You have problems with your vision.  You have trouble talking or swallowing.  You have trouble standing or walking.  You have chest pain.  You are light-headed.  You pass out (lose consciousness). This information is not intended to replace advice given to you by your health care provider. Make sure you discuss any questions you have with your health care provider. Document Released: 12/23/2007 Document Revised: 02/04/2015 Document Reviewed: 10/30/2014 Elsevier Interactive Patient Education  [employer].

## 2017-03-30 NOTE — NC FL2 (Signed)
[place] LEVEL OF CARE SCREENING TOOL     IDENTIFICATION  Patient Name: [name] Birthdate: [birth year] Sex: female Admission Date (Current Location): 03/28/2017  [facility] [place] Number:  [employer] and Address:  [place] [address], [place]      Provider Number: [id number]  Attending Physician Name and Address:  Murlean Iba, MD  Relative Name and Phone Number:       Current Level of Care: Other (Comment)(Observation) Recommended Level of Care: Assisted Living Facility Prior Approval Number:    Date Approved/Denied:   PASRR Number:    Discharge Plan: Other (Comment)(ALF)    Current Diagnoses: Patient Active Problem List   Diagnosis Date Noted  . Orthostasis 03/28/2017  . General weakness 03/28/2017  . History of cerebrovascular accident (CVA) with residual deficit 03/28/2017  . Schizophrenia ([place]) 03/28/2017  . Generalized weakness 03/28/2017  . Orthostatic hypotension   . IBS (irritable bowel syndrome) 10/16/2016  . Left hemiparesis ([place]) 10/06/2016  . Left-sided weakness 10/06/2016  . Chronic superficial gastritis without bleeding   . Stricture and stenosis of esophagus   . Nausea with vomiting 04/20/2016  . Abdominal pain, acute, bilateral lower quadrant 03/22/2016  . Lesion of right lobe of liver 03/22/2016  . Elevated troponin 02/13/2016  . IDA (iron deficiency anemia) 04/06/2015  . Mixed hyperlipidemia 11/06/2014  . Obesity due to excess calories 11/06/2014  . Sedentary lifestyle 11/06/2014  . Rectal bleeding 10/21/2014  . Encounter for therapeutic drug monitoring 03/24/2013  . Chronic anticoagulation 04/09/2010  . Type 2 diabetes mellitus with vascular disease ([place]) 03/24/2010  . Essential hypertension, benign 03/24/2010  . DVT, lower extremity, recurrent ([place]) 03/24/2010  . History of cardiovascular disorder 03/24/2010    Orientation RESPIRATION BLADDER Height & Weight         Normal Incontinent Weight: 154 lb 5.2 oz (70 kg) Height:  5\' 4"  (162.6 cm)  BEHAVIORAL SYMPTOMS/MOOD NEUROLOGICAL BOWEL NUTRITION STATUS      Incontinent Diet  AMBULATORY STATUS COMMUNICATION OF NEEDS Skin   Extensive Assist(uses wheelchair) Verbally Normal                       Personal Care Assistance Level of Assistance  Bathing, Feeding, Dressing Bathing Assistance: Limited assistance Feeding assistance: Independent Dressing Assistance: Limited assistance     Functional Limitations Info  Sight, Hearing, Speech Sight Info: Adequate Hearing Info: Adequate Speech Info: Adequate    SPECIAL CARE FACTORS FREQUENCY                       Contractures Contractures Info: Not present    Additional Factors Info  Code Status, Allergies Code Status Info: Full Code Allergies Info: Sulfa Antibiotics, Sulfonamide Derivatives           Current Medications (03/30/2017):  This is the current hospital active medication list Current Facility-Administered Medications  Medication Dose Route Frequency Provider Last Rate Last Dose  . 0.9 %  sodium chloride infusion   Intravenous Continuous Johnson, Clanford L, MD 65 mL/hr at 03/30/17 0424    . acetaminophen (TYLENOL) tablet 650 mg  650 mg Oral Q6H PRN Opyd, Ilene Qua, MD   650 mg at 03/30/17 0225   Or  . acetaminophen (TYLENOL) suppository 650 mg  650 mg Rectal Q6H PRN Opyd, Ilene Qua, MD      . ARIPiprazole (ABILIFY) tablet 2 mg  2 mg Oral Daily Opyd, Christia Reading  S, MD   2 mg at 03/30/17 0900  . calcium carbonate (TUMS - dosed in mg elemental calcium) chewable tablet 200 mg of elemental calcium  1 tablet Oral QID PRN Opyd, Ilene Qua, MD      . clonazePAM (KLONOPIN) tablet 0.5 mg  0.5 mg Oral QID Opyd, Ilene Qua, MD   0.5 mg at 03/30/17 0900  . darifenacin (ENABLEX) 24 hr tablet 7.5 mg  7.5 mg Oral Daily Opyd, Ilene Qua, MD   7.5 mg at 03/30/17 0900  . DULoxetine (CYMBALTA) DR capsule 120 mg  120 mg Oral Daily Opyd, Ilene Qua, MD    120 mg at 03/30/17 0900  . HYDROcodone-acetaminophen (NORCO/VICODIN) 5-325 MG per tablet 1 tablet  1 tablet Oral TID Opyd, Ilene Qua, MD   1 tablet at 03/30/17 0900  . insulin aspart (novoLOG) injection 0-5 Units  0-5 Units Subcutaneous QHS Opyd, Timothy S, MD      . insulin aspart (novoLOG) injection 0-9 Units  0-9 Units Subcutaneous TID WC Opyd, Ilene Qua, MD   1 Units at 03/30/17 0859  . insulin detemir (LEVEMIR) injection 30 Units  30 Units Subcutaneous QHS Vianne Bulls, MD   30 Units at 03/29/17 2102  . latanoprost (XALATAN) 0.005 % ophthalmic solution 1 drop  1 drop Both Eyes QHS Opyd, Ilene Qua, MD   1 drop at 03/29/17 2046  . mirtazapine (REMERON) tablet 30 mg  30 mg Oral QHS Opyd, Ilene Qua, MD   30 mg at 03/29/17 2045  . ondansetron (ZOFRAN) tablet 4 mg  4 mg Oral Q6H PRN Opyd, Ilene Qua, MD       Or  . ondansetron (ZOFRAN) injection 4 mg  4 mg Intravenous Q6H PRN Opyd, Ilene Qua, MD      . pantoprazole (PROTONIX) EC tablet 40 mg  40 mg Oral Daily Opyd, Ilene Qua, MD   40 mg at 03/30/17 0900  . polyvinyl alcohol (LIQUIFILM TEARS) 1.4 % ophthalmic solution 1 drop  1 drop Both Eyes TID Opyd, Ilene Qua, MD   1 drop at 03/30/17 0904  . risperiDONE (RISPERDAL M-TABS) disintegrating tablet 3 mg  3 mg Oral BID Opyd, Ilene Qua, MD   3 mg at 03/30/17 0900  . senna-docusate (Senokot-S) tablet 2 tablet  2 tablet Oral BID Wynetta Emery, Clanford L, MD   2 tablet at 03/30/17 0900  . simvastatin (ZOCOR) tablet 20 mg  20 mg Oral QHS Opyd, Ilene Qua, MD   20 mg at 03/29/17 2045  . tiZANidine (ZANAFLEX) tablet 2 mg  2 mg Oral Daily Opyd, Ilene Qua, MD   2 mg at 03/30/17 0900  . warfarin (COUMADIN) tablet 2.5 mg  2.5 mg Oral Once Wynetta Emery, Clanford L, MD      . Warfarin - Pharmacist Dosing Inpatient   Does not apply Q24H Opyd, Ilene Qua, MD      . zolpidem (AMBIEN) tablet 5 mg  5 mg Oral QHS Opyd, Ilene Qua, MD   5 mg at 03/29/17 2045     Discharge Medications: Medication List     TAKE these  medications   acetaminophen 325 MG tablet Commonly known as:  TYLENOL Take 650 mg by mouth every 6 (six) hours as needed. For fever > 101   ARIPiprazole 2 MG tablet Commonly known as:  ABILIFY Take 2 mg by mouth daily.   ARTIFICIAL TEARS 1.4 % ophthalmic solution Generic drug:  polyvinyl alcohol Place 1 drop into both eyes 3 (three) times daily.  calcium carbonate 500 MG chewable tablet Commonly known as:  TUMS - dosed in mg elemental calcium Chew 1 tablet by mouth 4 (four) times daily as needed for indigestion or heartburn.   clonazePAM 0.5 MG tablet Commonly known as:  KLONOPIN Take 1 tablet (0.5 mg total) by mouth 4 (four) times daily.   DULoxetine 60 MG capsule Commonly known as:  CYMBALTA Take 120 mg by mouth daily.   HYDROcodone-acetaminophen 5-325 MG tablet Commonly known as:  NORCO/VICODIN Take 1 tablet by mouth 3 (three) times daily.   Insulin Detemir 100 UNIT/ML Pen Commonly known as:  LEVEMIR FLEXTOUCH Inject 30 Units into the skin daily at 10 pm. What changed:  See the new instructions.   iron polysaccharides 150 MG capsule Commonly known as:  NIFEREX Take 1 capsule (150 mg total) by mouth daily.   latanoprost 0.005 % ophthalmic solution Commonly known as:  XALATAN Place 1 drop into both eyes at bedtime.   loperamide 2 MG capsule Commonly known as:  IMODIUM Take 4 mg by mouth daily as needed for diarrhea or loose stools. Take 2 capsules initially then take 2 after each loose stool per MAR   metoCLOPramide 5 MG tablet Commonly known as:  REGLAN Take 1 tablet (5 mg total) by mouth every 8 (eight) hours as needed for nausea or vomiting (or bloating).   metoprolol succinate 25 MG 24 hr tablet Commonly known as:  TOPROL-XL Take 0.5 tablets (12.5 mg total) by mouth daily. Start taking on:  03/31/2017 What changed:  when to take this   mirtazapine 30 MG tablet Commonly known as:  REMERON Take 30 mg by mouth at bedtime.   MOVANTIK 12.5 MG  Tabs tablet Generic drug:  naloxegol oxalate Take 12.5 mg by mouth daily.   NOVOFINE AUTOCOVER 30G X 8 MM Misc Generic drug:  Insulin Pen Needle   NOVOLOG FLEXPEN [place] Inject 15-21 Units into the skin 3 (three) times daily before meals.   NOVOLOG FLEXPEN 100 UNIT/ML FlexPen Generic drug:  insulin aspart INJECT 15 UNITS SUBCUTANEOUSLY BEFORE MEALS IF BS IS BETWEEN 90-150.   omeprazole 40 MG capsule Commonly known as:  PRILOSEC Take 40 mg by mouth daily.   ondansetron 4 MG tablet Commonly known as:  ZOFRAN Take 1 tablet (4 mg total) by mouth 4 (four) times daily -  before meals and at bedtime.   polyethylene glycol powder powder Commonly known as:  GLYCOLAX/MIRALAX Take 17 g by mouth daily. What changed:    when to take this  reasons to take this   promethazine 12.5 MG tablet Commonly known as:  PHENERGAN TAKE 1 TABLET BY MOUTH EVERY 6 HOURS AS NEEDED FOR NAUSEA OR VOMITING.   risperiDONE 3 MG tablet Commonly known as:  RISPERDAL Take 3 mg by mouth 2 (two) times daily.   simvastatin 20 MG tablet Commonly known as:  ZOCOR Take 20 mg by mouth at bedtime.   solifenacin 5 MG tablet Commonly known as:  VESICARE Take 5 mg by mouth daily.   tiZANidine 2 MG tablet Commonly known as:  ZANAFLEX Take 2 mg by mouth daily.   Vitamin D (Ergocalciferol) 50000 units Caps capsule Commonly known as:  DRISDOL Take 50,000 Units by mouth every Friday.   warfarin 5 MG tablet Commonly known as:  COUMADIN Take as directed. If you are unsure how to take this medication, talk to your nurse or doctor. Original instructions:  TAKE 1/2 TABLET (2.5mg ) BY MOUTH ONCE DAILY ON MON,WED,FRI & SATURDAY. TAKE 1  TABLET (5mg ) ON SUNDAY, TUES,& THURS.   zolpidem 10 MG tablet Commonly known as:  AMBIEN Take 1 tablet (10 mg total) by mouth at bedtime.       Relevant Imaging Results:  Relevant Lab Results:   Additional Information    Lula Kolton, Clydene Pugh, LCSW

## 2017-03-30 NOTE — Progress Notes (Signed)
Pt's IV catheter removed and intact. Pt's IV site clean dry and intact. Pt transported to facility via [facility] transportation. Pt in stable condition and in no acute distress at time of discharge. Pt escorted by RN.

## 2017-03-30 NOTE — Clinical Social Work Note (Addendum)
Discharge clinicals sent to facility and Tammy at [facility] indicated that they would pick patient up.   Message left for [name] at [facility] advising of patient discharge.   LCSW signing off.     Jaylin Benzel, Clydene Pugh, LCSW

## 2017-03-30 NOTE — Discharge Summary (Signed)
Physician Discharge Summary  [name] HWE:[id number] DOB: 1945-08-15 DOA: 03/28/2017  PCP: [name], MD  Admit date: 03/28/2017 Discharge date: 03/30/2017  Admitted From: Assisted living facility Disposition: Assisted living facility  Recommendations for Outpatient Follow-up:  1. Follow up with PCP in 1 weeks 2. Please obtain INR/BMP/CBC in one week 3. Please monitor blood sugars at least 2 times per day 4. Please follow up on the following pending results: Final culture data  Discharge Condition: STABLE   CODE STATUS: FULL    Brief Hospitalization Summary: Please see all hospital notes, images, labs for full details of the hospitalization. HPI: [name] is a 72 y.o. female with medical history significant for history of CVA with left-sided weakness, history of DVT on warfarin, schizophrenia, insulin-dependent diabetes mellitus, and history of hypertension, now presenting to the emergency department for evaluation of generalized weakness, lightheadedness on standing, and hypotension in her endocrinologist office just prior to arrival.  Patient reports that she recently developed loose stools, and states that this has subsided after she stopped her laxatives and started loperamide as needed.  She reports the insidious development of generalized weakness and lightheadedness upon standing over the past couple days.  She denies any change in vision or hearing, and denies any new focal numbness or weakness.  Denies chest pain.  She reports dark stool chronically.  She saw her endocrinologist today in the clinic and was noted to have a blood pressure of 70/38.  She was directed to the ED for further evaluation of this.  ED Course: Upon arrival to the ED, patient is found to be afebrile, saturating well on room air, and with vitals otherwise stable.  EKG features a sinus rhythm with nonspecific ST abnormality.  Chest x-ray is negative for acute cardiopulmonary disease.  CT of the  abdomen and pelvis is negative for acute intra-abdominal or pelvic abnormality.  Chemistry panel and CBC are unremarkable.  Fecal occult blood testing is negative.  Lactic acid is normal.  Troponin is undetectable.  Urinalysis is largely unremarkable.  INR is subtherapeutic at 1.74.  Patient was orthostatic in the ED with a 22 mmHg drop in systolic blood pressure upon standing.  She was started on normal saline infusion.  She remains hemodynamically stable, in no apparent respiratory distress, and will be observed on the medical-surgical unit for ongoing evaluation and management of generalized weakness with hypotension, likely secondary to recent diarrhea that has since resolved, exacerbated by continued use of her antihypertensive.  Brief Admission Hx: Courtney Knodel Thorpeis a 72 y.o.femalewith medical history significant forhistory of CVA with left-sided weakness, history of DVT on warfarin, schizophrenia, insulin-dependent diabetes mellitus, and history of hypertension, now presenting to the emergency department for evaluation of generalized weakness, lightheadedness on standing, and hypotension in her endocrinologist office just prior to arrival.   MDM/Assessment & Plan:   1.Generalized weakness; orthostasis -Presents with 2 weeks of progressive gen weakness, lightheaded on standing, found to have BP 70/38 in endocrinology clinic just prior to arrival in ED -Likely secondary to recent diarrhea that has resolved with cessation of laxatives and prn loperamide -No evidence for infectious etiology -Hold Toprol, continue IVF hydration overnight, hopefully can discharge home in AM.   2.Hx of hypertension -blood pressures much improved from admission now and can resume BP meds  -resume home toprol at lower dose.  3.Hx of DVT -No evidence for acute VTE -Continue warfarin with pharmacist assistance.   4.Hx of CVA with left-sided weakness -Stable left-sided weakness,  no acute deficits identified -Continue statin and anticoagulation  5.Schizophrenia -Denies SI, HI, or active hallucinations -Continue Risperdal, Abilify, Remeron, Cymbalta, and Klonopin  6.Insulin-dependent DM -A1c was 6.5% last month - She is eating a little better today.  -Continue Levemir with 30 units qHS and Novolog per sliding-scale   CBG (last 3)  Recent Labs    03/29/17 1615 03/29/17 2057 03/30/17 0816  GLUCAP 141* 150* 146*    7. Constipation - moderate stool noted throughout colon, laxatives ordered.  Continue laxatives daily at assisted living facility.   DVT prophylaxis:warfarin Code Status:Full Family Communication:Discussed with patient Consults called:None Disposition: Return to ALF  Discharge Diagnoses:  Principal Problem:   General weakness Active Problems:   Type 2 diabetes mellitus with vascular disease ([place])   Essential hypertension, benign   DVT, lower extremity, recurrent (HCC)   Left hemiparesis (HCC)   Orthostasis   History of cerebrovascular accident (CVA) with residual deficit   Schizophrenia (HCC)   Generalized weakness  Discharge Instructions: Discharge Instructions    Call MD for:  extreme fatigue   Complete by:  As directed    Call MD for:  hives   Complete by:  As directed    Call MD for:  persistant dizziness or light-headedness   Complete by:  As directed    Call MD for:  severe uncontrolled pain   Complete by:  As directed    Diet - low sodium heart healthy   Complete by:  As directed    Increase activity slowly   Complete by:  As directed      Allergies as of 03/30/2017      Reactions   Sulfa Antibiotics Rash   Sulfonamide Derivatives Rash   REACTION: rash      Medication List    TAKE these medications   acetaminophen 325 MG tablet Commonly known as:  TYLENOL Take 650 mg by mouth every 6 (six) hours as needed. For fever > 101   ARIPiprazole 2 MG tablet Commonly known as:   ABILIFY Take 2 mg by mouth daily.   ARTIFICIAL TEARS 1.4 % ophthalmic solution Generic drug:  polyvinyl alcohol Place 1 drop into both eyes 3 (three) times daily.   calcium carbonate 500 MG chewable tablet Commonly known as:  TUMS - dosed in mg elemental calcium Chew 1 tablet by mouth 4 (four) times daily as needed for indigestion or heartburn.   clonazePAM 0.5 MG tablet Commonly known as:  KLONOPIN Take 1 tablet (0.5 mg total) by mouth 4 (four) times daily.   DULoxetine 60 MG capsule Commonly known as:  CYMBALTA Take 120 mg by mouth daily.   HYDROcodone-acetaminophen 5-325 MG tablet Commonly known as:  NORCO/VICODIN Take 1 tablet by mouth 3 (three) times daily.   Insulin Detemir 100 UNIT/ML Pen Commonly known as:  LEVEMIR FLEXTOUCH Inject 30 Units into the skin daily at 10 pm. What changed:  See the new instructions.   iron polysaccharides 150 MG capsule Commonly known as:  NIFEREX Take 1 capsule (150 mg total) by mouth daily.   latanoprost 0.005 % ophthalmic solution Commonly known as:  XALATAN Place 1 drop into both eyes at bedtime.   loperamide 2 MG capsule Commonly known as:  IMODIUM Take 4 mg by mouth daily as needed for diarrhea or loose stools. Take 2 capsules initially then take 2 after each loose stool per MAR   metoCLOPramide 5 MG tablet Commonly known as:  REGLAN Take 1 tablet (5 mg total) by mouth every  8 (eight) hours as needed for nausea or vomiting (or bloating).   metoprolol succinate 25 MG 24 hr tablet Commonly known as:  TOPROL-XL Take 0.5 tablets (12.5 mg total) by mouth daily. Start taking on:  03/31/2017 What changed:  when to take this   mirtazapine 30 MG tablet Commonly known as:  REMERON Take 30 mg by mouth at bedtime.   MOVANTIK 12.5 MG Tabs tablet Generic drug:  naloxegol oxalate Take 12.5 mg by mouth daily.   NOVOFINE AUTOCOVER 30G X 8 MM Misc Generic drug:  Insulin Pen Needle   NOVOLOG FLEXPEN [place] Inject 15-21 Units into the skin  3 (three) times daily before meals.   NOVOLOG FLEXPEN 100 UNIT/ML FlexPen Generic drug:  insulin aspart INJECT 15 UNITS SUBCUTANEOUSLY BEFORE MEALS IF BS IS BETWEEN 90-150.   omeprazole 40 MG capsule Commonly known as:  PRILOSEC Take 40 mg by mouth daily.   ondansetron 4 MG tablet Commonly known as:  ZOFRAN Take 1 tablet (4 mg total) by mouth 4 (four) times daily -  before meals and at bedtime.   polyethylene glycol powder powder Commonly known as:  GLYCOLAX/MIRALAX Take 17 g by mouth daily. What changed:    when to take this  reasons to take this   promethazine 12.5 MG tablet Commonly known as:  PHENERGAN TAKE 1 TABLET BY MOUTH EVERY 6 HOURS AS NEEDED FOR NAUSEA OR VOMITING.   risperiDONE 3 MG tablet Commonly known as:  RISPERDAL Take 3 mg by mouth 2 (two) times daily.   simvastatin 20 MG tablet Commonly known as:  ZOCOR Take 20 mg by mouth at bedtime.   solifenacin 5 MG tablet Commonly known as:  VESICARE Take 5 mg by mouth daily.   tiZANidine 2 MG tablet Commonly known as:  ZANAFLEX Take 2 mg by mouth daily.   Vitamin D (Ergocalciferol) 50000 units Caps capsule Commonly known as:  DRISDOL Take 50,000 Units by mouth every Friday.   warfarin 5 MG tablet Commonly known as:  COUMADIN Take as directed. If you are unsure how to take this medication, talk to your nurse or doctor. Original instructions:  TAKE 1/2 TABLET (2.5mg ) BY MOUTH ONCE DAILY ON MON,WED,FRI & SATURDAY. TAKE 1 TABLET (5mg ) ON SUNDAY, TUES,& THURS.   zolpidem 10 MG tablet Commonly known as:  AMBIEN Take 1 tablet (10 mg total) by mouth at bedtime.      Follow-up Information    [name], MD. Schedule an appointment as soon as possible for a visit in 2 week(s).   Specialty:  Internal Medicine Why:  Hospital Follow Up  Contact information: [place] [address] [phone]        Cassandria Anger, MD. Schedule an appointment as soon as possible for a  visit in 2 month(s).   Specialty:  Endocrinology Contact information: [place] [address] [phone]          Allergies  Allergen Reactions  . Sulfa Antibiotics Rash  . Sulfonamide Derivatives Rash    REACTION: rash   Allergies as of 03/30/2017      Reactions   Sulfa Antibiotics Rash   Sulfonamide Derivatives Rash   REACTION: rash      Medication List    TAKE these medications   acetaminophen 325 MG tablet Commonly known as:  TYLENOL Take 650 mg by mouth every 6 (six) hours as needed. For fever > 101   ARIPiprazole 2 MG tablet Commonly known as:  ABILIFY Take  2 mg by mouth daily.   ARTIFICIAL TEARS 1.4 % ophthalmic solution Generic drug:  polyvinyl alcohol Place 1 drop into both eyes 3 (three) times daily.   calcium carbonate 500 MG chewable tablet Commonly known as:  TUMS - dosed in mg elemental calcium Chew 1 tablet by mouth 4 (four) times daily as needed for indigestion or heartburn.   clonazePAM 0.5 MG tablet Commonly known as:  KLONOPIN Take 1 tablet (0.5 mg total) by mouth 4 (four) times daily.   DULoxetine 60 MG capsule Commonly known as:  CYMBALTA Take 120 mg by mouth daily.   HYDROcodone-acetaminophen 5-325 MG tablet Commonly known as:  NORCO/VICODIN Take 1 tablet by mouth 3 (three) times daily.   Insulin Detemir 100 UNIT/ML Pen Commonly known as:  LEVEMIR FLEXTOUCH Inject 30 Units into the skin daily at 10 pm. What changed:  See the new instructions.   iron polysaccharides 150 MG capsule Commonly known as:  NIFEREX Take 1 capsule (150 mg total) by mouth daily.   latanoprost 0.005 % ophthalmic solution Commonly known as:  XALATAN Place 1 drop into both eyes at bedtime.   loperamide 2 MG capsule Commonly known as:  IMODIUM Take 4 mg by mouth daily as needed for diarrhea or loose stools. Take 2 capsules initially then take 2 after each loose stool per MAR   metoCLOPramide 5 MG tablet Commonly known as:  REGLAN Take 1  tablet (5 mg total) by mouth every 8 (eight) hours as needed for nausea or vomiting (or bloating).   metoprolol succinate 25 MG 24 hr tablet Commonly known as:  TOPROL-XL Take 0.5 tablets (12.5 mg total) by mouth daily. Start taking on:  03/31/2017 What changed:  when to take this   mirtazapine 30 MG tablet Commonly known as:  REMERON Take 30 mg by mouth at bedtime.   MOVANTIK 12.5 MG Tabs tablet Generic drug:  naloxegol oxalate Take 12.5 mg by mouth daily.   NOVOFINE AUTOCOVER 30G X 8 MM Misc Generic drug:  Insulin Pen Needle   NOVOLOG FLEXPEN [place] Inject 15-21 Units into the skin 3 (three) times daily before meals.   NOVOLOG FLEXPEN 100 UNIT/ML FlexPen Generic drug:  insulin aspart INJECT 15 UNITS SUBCUTANEOUSLY BEFORE MEALS IF BS IS BETWEEN 90-150.   omeprazole 40 MG capsule Commonly known as:  PRILOSEC Take 40 mg by mouth daily.   ondansetron 4 MG tablet Commonly known as:  ZOFRAN Take 1 tablet (4 mg total) by mouth 4 (four) times daily -  before meals and at bedtime.   polyethylene glycol powder powder Commonly known as:  GLYCOLAX/MIRALAX Take 17 g by mouth daily. What changed:    when to take this  reasons to take this   promethazine 12.5 MG tablet Commonly known as:  PHENERGAN TAKE 1 TABLET BY MOUTH EVERY 6 HOURS AS NEEDED FOR NAUSEA OR VOMITING.   risperiDONE 3 MG tablet Commonly known as:  RISPERDAL Take 3 mg by mouth 2 (two) times daily.   simvastatin 20 MG tablet Commonly known as:  ZOCOR Take 20 mg by mouth at bedtime.   solifenacin 5 MG tablet Commonly known as:  VESICARE Take 5 mg by mouth daily.   tiZANidine 2 MG tablet Commonly known as:  ZANAFLEX Take 2 mg by mouth daily.   Vitamin D (Ergocalciferol) 50000 units Caps capsule Commonly known as:  DRISDOL Take 50,000 Units by mouth every Friday.   warfarin 5 MG tablet Commonly known as:  COUMADIN Take as directed. If you  are unsure how to take this medication, talk to your nurse or  doctor. Original instructions:  TAKE 1/2 TABLET (2.5mg ) BY MOUTH ONCE DAILY ON MON,WED,FRI & SATURDAY. TAKE 1 TABLET (5mg ) ON SUNDAY, TUES,& THURS.   zolpidem 10 MG tablet Commonly known as:  AMBIEN Take 1 tablet (10 mg total) by mouth at bedtime.       Procedures/Studies: Dg Chest 2 View  Result Date: 03/28/2017 CLINICAL DATA:  Generalized weakness upon awakening this morning. Hypotension, diabetes, former smoker. EXAM: CHEST - 2 VIEW COMPARISON:  Portable chest x-ray of October 06, 2016 and PA and lateral chest x-ray of February 13, 2016 and December 27, 2010. FINDINGS: The right lung is well-expanded and clear. The left lung also appears clear. The cardiac silhouette is enlarged. The pulmonary vascularity is not engorged. There is stable dextrocurvature centered in the midthoracic spine. There is calcification in the wall of the aortic arch. There is no pleural effusion. There is a stable calcified nodule projecting over the lower thoracic spine which may reflect a bone island or a calcified nodule. It is unchanged since December 27, 2010. IMPRESSION: There is no acute cardiopulmonary disease. Stable cardiomegaly. Probable COPD-smoking related changes. Thoracic aortic atherosclerosis. Electronically Signed   By: David  Martinique M.D.   On: 03/28/2017 10:27   Dg Lumbar Spine Complete  Result Date: 03/01/2017 CLINICAL DATA:  Low back pain after fall 1 month ago. EXAM: LUMBAR SPINE - COMPLETE 4+ VIEW COMPARISON:  CT scan of March 21, 2016. FINDINGS: Stable grade 1 anterolisthesis of L4-5 is noted secondary to posterior facet joint hypertrophy. Moderate degenerative disc disease is noted at T12-L1 and L1-2 with anterior osteophyte formation. Mild degenerative disc disease is noted at L5-S1 with anterior osteophyte formation. Atherosclerosis of abdominal aorta is noted. No fracture is noted. IMPRESSION: Multilevel degenerative disc disease. No acute abnormality seen in the lumbar spine. Electronically  Signed   By: Marijo Conception, M.D.   On: 03/01/2017 08:06   Ct Abdomen Pelvis W Contrast  Result Date: 03/28/2017 CLINICAL DATA:  Dark stools and abdominal tenderness x 2 weeks; Pt resident of [facility]. Pt C/o generalized weakness since waking this morning. EXAM: CT ABDOMEN AND PELVIS WITH CONTRAST TECHNIQUE: Multidetector CT imaging of the abdomen and pelvis was performed using the standard protocol following bolus administration of intravenous contrast. CONTRAST:  163mL ISOVUE-300 IOPAMIDOL (ISOVUE-300) INJECTION 61% COMPARISON:  03/11/2016, 02/23/2010 FINDINGS: Lower chest: 12 mm centrally calcified right lower lobe pulmonary nodule unchanged compared with 02/23/2010 most consistent with sequela prior granulomatous disease. Hepatobiliary: 3.3 x 3.5 cm mass in the right hepatic lobe peripherally with peripheral nodular centripetal enhancement most consistent with a hemangioma. No other hepatic mass. Cholelithiasis. No pericholecystic fluid or inflammatory changes. No intrahepatic or extrahepatic biliary ductal dilatation. Pancreas: Unremarkable. No pancreatic ductal dilatation or surrounding inflammatory changes. Spleen: Normal in size without focal abnormality. Adrenals/Urinary Tract: Adrenal glands are unremarkable. Kidneys are normal, without renal calculi, focal lesion, or hydronephrosis. Bladder is unremarkable. Stomach/Bowel: No pneumatosis, pneumoperitoneum or portal venous gas. No bowel obstruction. No bowel wall thickening or inflammatory changes. Moderate amount of stool throughout the colon. Normal appendix. Vascular/Lymphatic: Abdominal aortic atherosclerosis. Normal caliber abdominal aorta. No lymphadenopathy. Reproductive: 5.3 x 3.8 cm hypodense, fluid attenuating left ovarian mass most consistent with a cyst. No other adnexal mass. Normal uterus. Other: No abdominal wall hernia or abnormality. No abdominopelvic ascites. Focal area of inflammatory changes in the right anterior abdominal wall  measuring approximately 14 mm most consistent with  an area of fat necrosis. Musculoskeletal: No acute osseous abnormality. No aggressive osseous lesion. Grade 1 anterolisthesis of L4 on L5. Severe bilateral facet arthropathy L4-5 and broad-based disc bulge with overall severe spinal stenosis at L4-5. Degenerative disc disease with disc height loss and bilateral moderate facet arthropathy at L5-S1. IMPRESSION: 1. No acute intraabdominal or pelvic pathology. 2. Focal area of inflammatory changes in the right anterior abdominal wall measuring approximately 14 mm most consistent with an area of fat necrosis. 3. Moderate amount of stool throughout the colon. 4. Multifactorial L4-5 severe spinal stenosis. Electronically Signed   By: Kathreen Devoid   On: 03/28/2017 11:04   Dg Knee Complete 4 Views Right  Result Date: 03/01/2017 CLINICAL DATA:  Fall. EXAM: RIGHT KNEE - COMPLETE 4+ VIEW COMPARISON:  07/23/2008 FINDINGS: Chondrocalcinosis with tricompartment degenerative changes fluid in joint space narrowing and spurring. Small joint effusion. No acute bony abnormality. Specifically, no fracture, subluxation, or dislocation. IMPRESSION: Chondrocalcinosis and moderate tricompartment degenerative changes. Small joint effusion. No acute bony abnormality. Electronically Signed   By: Rolm Baptise M.D.   On: 03/01/2017 08:04     Subjective: Pt without complaints, says that she feels better and wants to eat breakfast.    Discharge Exam: Vitals:   03/29/17 2022 03/30/17 0500  BP: (!) 129/57 (!) 124/56  Pulse: 75 91  Resp:    Temp: 98.3 F (36.8 C) 98.3 F (36.8 C)  SpO2: 97% 96%   Vitals:   03/29/17 0633 03/29/17 1946 03/29/17 2022 03/30/17 0500  BP: 132/74  (!) 129/57 (!) 124/56  Pulse: 66  75 91  Resp: 18     Temp: 98.4 F (36.9 C)  98.3 F (36.8 C) 98.3 F (36.8 C)  TempSrc: Oral  Oral Oral  SpO2: 94% 94% 97% 96%  Weight:      Height:       General: Pt is alert, awake, not in acute  distress Cardiovascular: RRR, S1/S2 +, no rubs, no gallops Respiratory: CTA bilaterally, no wheezing, no rhonchi Abdominal: Soft, NT, ND, bowel sounds + Extremities: no edema, no cyanosis   The results of significant diagnostics from this hospitalization (including imaging, microbiology, ancillary and laboratory) are listed below for reference.     Microbiology: Recent Results (from the past 240 hour(s))  Urine culture     Status: Abnormal   Collection Time: 03/28/17  9:42 AM  Result Value Ref Range Status   Specimen Description   Final    URINE, CLEAN CATCH Performed at [facility], [address], [place], [place] [address]    Special Requests   Final    NONE Performed at [facility], [address] [place], [place] [address]    Culture MULTIPLE SPECIES PRESENT, SUGGEST RECOLLECTION (A)  Final   Report Status 03/30/2017 FINAL  Final  MRSA PCR Screening     Status: None   Collection Time: 03/28/17  8:46 PM  Result Value Ref Range Status   MRSA by PCR NEGATIVE NEGATIVE Final    Comment:        The GeneXpert MRSA Assay (FDA approved for NASAL specimens only), is one component of a comprehensive MRSA colonization surveillance program. It is not intended to diagnose MRSA infection nor to guide or monitor treatment for MRSA infections. Performed at [facility], [address] [place],  [address]      Labs: BNP (last 3 results) No results for input(s): BNP in the last 8760 hours. Basic Metabolic Panel: Recent Labs  Lab  03/28/17 0952 03/29/17 0450  NA 137 138  K 3.8 4.2  CL 99* 103  CO2 28 26  GLUCOSE 144* 145*  BUN 14 10  CREATININE 0.74 0.57  CALCIUM 9.0 8.7*   Liver Function Tests: Recent Labs  Lab 03/28/17 0952  AST 14*  ALT 12*  ALKPHOS 81  BILITOT 0.5  PROT 7.3  ALBUMIN 3.9   Recent Labs  Lab 03/28/17 0952  LIPASE 20   No results for input(s): AMMONIA in the last 168 hours. CBC: Recent Labs  Lab 03/28/17 0952 03/29/17 0450  WBC  5.9 5.4  NEUTROABS 3.5  --   HGB 13.6 13.0  HCT 41.9 39.2  MCV 90.7 90.7  PLT 241 228   Cardiac Enzymes: Recent Labs  Lab 03/28/17 0952  TROPONINI <0.03   BNP: Invalid input(s): POCBNP CBG: Recent Labs  Lab 03/29/17 0739 03/29/17 1119 03/29/17 1615 03/29/17 2057 03/30/17 0816  GLUCAP 149* 184* 141* 150* 146*   D-Dimer No results for input(s): DDIMER in the last 72 hours. Hgb A1c No results for input(s): HGBA1C in the last 72 hours. Lipid Profile No results for input(s): CHOL, HDL, LDLCALC, TRIG, CHOLHDL, LDLDIRECT in the last 72 hours. Thyroid function studies Recent Labs    03/29/17 0450  TSH 1.138   Anemia work up No results for input(s): VITAMINB12, FOLATE, FERRITIN, TIBC, IRON, RETICCTPCT in the last 72 hours. Urinalysis    Component Value Date/Time   COLORURINE YELLOW 03/28/2017 0942   APPEARANCEUR CLEAR 03/28/2017 0942   APPEARANCEUR Cloudy (A) 05/01/2016 1525   LABSPEC 1.010 03/28/2017 0942   PHURINE 6.0 03/28/2017 0942   GLUCOSEU NEGATIVE 03/28/2017 0942   HGBUR NEGATIVE 03/28/2017 0942   BILIRUBINUR NEGATIVE 03/28/2017 0942   BILIRUBINUR Negative 05/01/2016 1525   KETONESUR NEGATIVE 03/28/2017 0942   PROTEINUR NEGATIVE 03/28/2017 0942   UROBILINOGEN 0.2 09/14/2014 1703   NITRITE NEGATIVE 03/28/2017 0942   LEUKOCYTESUR SMALL (A) 03/28/2017 0942   LEUKOCYTESUR 3+ (A) 05/01/2016 1525   Sepsis Labs Invalid input(s): PROCALCITONIN,  WBC,  LACTICIDVEN Microbiology Recent Results (from the past 240 hour(s))  Urine culture     Status: Abnormal   Collection Time: 03/28/17  9:42 AM  Result Value Ref Range Status   Specimen Description   Final    URINE, CLEAN CATCH Performed at [facility], [address] [place], [place] [address]    Special Requests   Final    NONE Performed at [facility], [address], [place], [place] [address]    Culture MULTIPLE SPECIES PRESENT, SUGGEST RECOLLECTION (A)  Final   Report Status 03/30/2017 FINAL  Final   MRSA PCR Screening     Status: None   Collection Time: 03/28/17  8:46 PM  Result Value Ref Range Status   MRSA by PCR NEGATIVE NEGATIVE Final    Comment:        The GeneXpert MRSA Assay (FDA approved for NASAL specimens only), is one component of a comprehensive MRSA colonization surveillance program. It is not intended to diagnose MRSA infection nor to guide or monitor treatment for MRSA infections. Performed at [facility], [address], [place], [place] [address]    Time coordinating discharge: 36 mins  SIGNED:  Irwin Brakeman, MD  [facility] 03/30/2017, 11:06 AM Pager [phone]  If 7PM-7AM, please contact night-coverage [email or web address] Password [email or web address]

## 2017-03-31 DIAGNOSIS — K5641 Fecal impaction: Secondary | ICD-10-CM | POA: Diagnosis not present

## 2017-03-31 DIAGNOSIS — I82502 Chronic embolism and thrombosis of unspecified deep veins of left lower extremity: Secondary | ICD-10-CM | POA: Diagnosis not present

## 2017-03-31 DIAGNOSIS — I10 Essential (primary) hypertension: Secondary | ICD-10-CM | POA: Diagnosis not present

## 2017-03-31 DIAGNOSIS — E119 Type 2 diabetes mellitus without complications: Secondary | ICD-10-CM | POA: Diagnosis not present

## 2017-03-31 DIAGNOSIS — I69354 Hemiplegia and hemiparesis following cerebral infarction affecting left non-dominant side: Secondary | ICD-10-CM | POA: Diagnosis not present

## 2017-03-31 DIAGNOSIS — F333 Major depressive disorder, recurrent, severe with psychotic symptoms: Secondary | ICD-10-CM | POA: Diagnosis not present

## 2017-04-03 DIAGNOSIS — I69354 Hemiplegia and hemiparesis following cerebral infarction affecting left non-dominant side: Secondary | ICD-10-CM | POA: Diagnosis not present

## 2017-04-03 DIAGNOSIS — E119 Type 2 diabetes mellitus without complications: Secondary | ICD-10-CM | POA: Diagnosis not present

## 2017-04-03 DIAGNOSIS — F333 Major depressive disorder, recurrent, severe with psychotic symptoms: Secondary | ICD-10-CM | POA: Diagnosis not present

## 2017-04-03 DIAGNOSIS — K5641 Fecal impaction: Secondary | ICD-10-CM | POA: Diagnosis not present

## 2017-04-03 DIAGNOSIS — I82502 Chronic embolism and thrombosis of unspecified deep veins of left lower extremity: Secondary | ICD-10-CM | POA: Diagnosis not present

## 2017-04-03 DIAGNOSIS — I10 Essential (primary) hypertension: Secondary | ICD-10-CM | POA: Diagnosis not present

## 2017-04-04 DIAGNOSIS — I69354 Hemiplegia and hemiparesis following cerebral infarction affecting left non-dominant side: Secondary | ICD-10-CM | POA: Diagnosis not present

## 2017-04-04 DIAGNOSIS — I82502 Chronic embolism and thrombosis of unspecified deep veins of left lower extremity: Secondary | ICD-10-CM | POA: Diagnosis not present

## 2017-04-04 DIAGNOSIS — I10 Essential (primary) hypertension: Secondary | ICD-10-CM | POA: Diagnosis not present

## 2017-04-04 DIAGNOSIS — E119 Type 2 diabetes mellitus without complications: Secondary | ICD-10-CM | POA: Diagnosis not present

## 2017-04-04 DIAGNOSIS — K5641 Fecal impaction: Secondary | ICD-10-CM | POA: Diagnosis not present

## 2017-04-04 DIAGNOSIS — F333 Major depressive disorder, recurrent, severe with psychotic symptoms: Secondary | ICD-10-CM | POA: Diagnosis not present

## 2017-04-05 ENCOUNTER — Other Ambulatory Visit: Payer: Self-pay | Admitting: Cardiology

## 2017-04-05 DIAGNOSIS — I10 Essential (primary) hypertension: Secondary | ICD-10-CM | POA: Diagnosis not present

## 2017-04-05 DIAGNOSIS — K5641 Fecal impaction: Secondary | ICD-10-CM | POA: Diagnosis not present

## 2017-04-05 DIAGNOSIS — F333 Major depressive disorder, recurrent, severe with psychotic symptoms: Secondary | ICD-10-CM | POA: Diagnosis not present

## 2017-04-05 DIAGNOSIS — I82502 Chronic embolism and thrombosis of unspecified deep veins of left lower extremity: Secondary | ICD-10-CM | POA: Diagnosis not present

## 2017-04-05 DIAGNOSIS — E119 Type 2 diabetes mellitus without complications: Secondary | ICD-10-CM | POA: Diagnosis not present

## 2017-04-05 DIAGNOSIS — I69354 Hemiplegia and hemiparesis following cerebral infarction affecting left non-dominant side: Secondary | ICD-10-CM | POA: Diagnosis not present

## 2017-04-06 DIAGNOSIS — K5641 Fecal impaction: Secondary | ICD-10-CM | POA: Diagnosis not present

## 2017-04-06 DIAGNOSIS — I10 Essential (primary) hypertension: Secondary | ICD-10-CM | POA: Diagnosis not present

## 2017-04-06 DIAGNOSIS — I82502 Chronic embolism and thrombosis of unspecified deep veins of left lower extremity: Secondary | ICD-10-CM | POA: Diagnosis not present

## 2017-04-06 DIAGNOSIS — I69354 Hemiplegia and hemiparesis following cerebral infarction affecting left non-dominant side: Secondary | ICD-10-CM | POA: Diagnosis not present

## 2017-04-06 DIAGNOSIS — F333 Major depressive disorder, recurrent, severe with psychotic symptoms: Secondary | ICD-10-CM | POA: Diagnosis not present

## 2017-04-06 DIAGNOSIS — E119 Type 2 diabetes mellitus without complications: Secondary | ICD-10-CM | POA: Diagnosis not present

## 2017-04-09 DIAGNOSIS — I82502 Chronic embolism and thrombosis of unspecified deep veins of left lower extremity: Secondary | ICD-10-CM | POA: Diagnosis not present

## 2017-04-09 DIAGNOSIS — E1165 Type 2 diabetes mellitus with hyperglycemia: Secondary | ICD-10-CM | POA: Diagnosis not present

## 2017-04-09 DIAGNOSIS — I69354 Hemiplegia and hemiparesis following cerebral infarction affecting left non-dominant side: Secondary | ICD-10-CM | POA: Diagnosis not present

## 2017-04-09 DIAGNOSIS — K5641 Fecal impaction: Secondary | ICD-10-CM | POA: Diagnosis not present

## 2017-04-09 DIAGNOSIS — I10 Essential (primary) hypertension: Secondary | ICD-10-CM | POA: Diagnosis not present

## 2017-04-09 DIAGNOSIS — G8190 Hemiplegia, unspecified affecting unspecified side: Secondary | ICD-10-CM | POA: Diagnosis not present

## 2017-04-09 DIAGNOSIS — I82549 Chronic embolism and thrombosis of unspecified tibial vein: Secondary | ICD-10-CM | POA: Diagnosis not present

## 2017-04-09 DIAGNOSIS — E119 Type 2 diabetes mellitus without complications: Secondary | ICD-10-CM | POA: Diagnosis not present

## 2017-04-09 DIAGNOSIS — F333 Major depressive disorder, recurrent, severe with psychotic symptoms: Secondary | ICD-10-CM | POA: Diagnosis not present

## 2017-04-10 DIAGNOSIS — I82502 Chronic embolism and thrombosis of unspecified deep veins of left lower extremity: Secondary | ICD-10-CM | POA: Diagnosis not present

## 2017-04-10 DIAGNOSIS — K5641 Fecal impaction: Secondary | ICD-10-CM | POA: Diagnosis not present

## 2017-04-10 DIAGNOSIS — I10 Essential (primary) hypertension: Secondary | ICD-10-CM | POA: Diagnosis not present

## 2017-04-10 DIAGNOSIS — E119 Type 2 diabetes mellitus without complications: Secondary | ICD-10-CM | POA: Diagnosis not present

## 2017-04-10 DIAGNOSIS — I69354 Hemiplegia and hemiparesis following cerebral infarction affecting left non-dominant side: Secondary | ICD-10-CM | POA: Diagnosis not present

## 2017-04-10 DIAGNOSIS — F333 Major depressive disorder, recurrent, severe with psychotic symptoms: Secondary | ICD-10-CM | POA: Diagnosis not present

## 2017-04-11 ENCOUNTER — Ambulatory Visit (INDEPENDENT_AMBULATORY_CARE_PROVIDER_SITE_OTHER): Payer: Medicare Other | Admitting: *Deleted

## 2017-04-11 DIAGNOSIS — Z8679 Personal history of other diseases of the circulatory system: Secondary | ICD-10-CM | POA: Diagnosis not present

## 2017-04-11 DIAGNOSIS — I6930 Unspecified sequelae of cerebral infarction: Secondary | ICD-10-CM | POA: Diagnosis not present

## 2017-04-11 DIAGNOSIS — Z5181 Encounter for therapeutic drug level monitoring: Secondary | ICD-10-CM | POA: Diagnosis not present

## 2017-04-11 LAB — POCT INR: INR: 1.9

## 2017-04-11 NOTE — Patient Instructions (Signed)
Take 5mg  tonight then resume 2.5mg  daily except 5mg  on Sundays, Tuesdays and Thursdays Recheck in 4 weeks

## 2017-04-12 ENCOUNTER — Other Ambulatory Visit: Payer: Self-pay | Admitting: "Endocrinology

## 2017-04-12 DIAGNOSIS — I69354 Hemiplegia and hemiparesis following cerebral infarction affecting left non-dominant side: Secondary | ICD-10-CM | POA: Diagnosis not present

## 2017-04-12 DIAGNOSIS — K5641 Fecal impaction: Secondary | ICD-10-CM | POA: Diagnosis not present

## 2017-04-12 DIAGNOSIS — E119 Type 2 diabetes mellitus without complications: Secondary | ICD-10-CM | POA: Diagnosis not present

## 2017-04-12 DIAGNOSIS — F333 Major depressive disorder, recurrent, severe with psychotic symptoms: Secondary | ICD-10-CM | POA: Diagnosis not present

## 2017-04-12 DIAGNOSIS — I10 Essential (primary) hypertension: Secondary | ICD-10-CM | POA: Diagnosis not present

## 2017-04-12 DIAGNOSIS — I82502 Chronic embolism and thrombosis of unspecified deep veins of left lower extremity: Secondary | ICD-10-CM | POA: Diagnosis not present

## 2017-04-16 ENCOUNTER — Telehealth: Payer: Self-pay | Admitting: Nurse Practitioner

## 2017-04-16 ENCOUNTER — Ambulatory Visit: Payer: Medicare Other | Admitting: Nurse Practitioner

## 2017-04-16 ENCOUNTER — Encounter: Payer: Self-pay | Admitting: Nurse Practitioner

## 2017-04-16 DIAGNOSIS — E119 Type 2 diabetes mellitus without complications: Secondary | ICD-10-CM | POA: Diagnosis not present

## 2017-04-16 DIAGNOSIS — F333 Major depressive disorder, recurrent, severe with psychotic symptoms: Secondary | ICD-10-CM | POA: Diagnosis not present

## 2017-04-16 DIAGNOSIS — K5641 Fecal impaction: Secondary | ICD-10-CM | POA: Diagnosis not present

## 2017-04-16 DIAGNOSIS — I69354 Hemiplegia and hemiparesis following cerebral infarction affecting left non-dominant side: Secondary | ICD-10-CM | POA: Diagnosis not present

## 2017-04-16 DIAGNOSIS — I82502 Chronic embolism and thrombosis of unspecified deep veins of left lower extremity: Secondary | ICD-10-CM | POA: Diagnosis not present

## 2017-04-16 DIAGNOSIS — I10 Essential (primary) hypertension: Secondary | ICD-10-CM | POA: Diagnosis not present

## 2017-04-16 NOTE — Progress Notes (Deleted)
Referring Provider: [name], MD Primary Care Physician:  [name], MD Primary GI:  Dr. Oneida Alar  No chief complaint on file.   HPI:   [name] is a 72 y.o. female who presents follow-up on IBS and rectal bleeding.  Patient was last seen in our office 10/16/2016 for the same as well as vomiting.  History of IDA.  After colonoscopy/EGD/capsule study in November 2016 felt to be related to erosive gastropathy and enteropathy, next due for colonoscopy in 2019.  Abnormal CT of the abdomen in February 2018 with follow-up MRI demonstrating right lobe liver hemangioma.  EGD updated 05/17/2016 with benign-appearing esophageal stenosis, small hiatal hernia, a few gastric polyps, chronic gastritis.  Lovenox and Coumadin guidance were provided.  Pathology found fundic gland polyp, chronic gastritis negative for H. Pylori.  At her last visit she was accompanied by facility staff and was doing well overall.  Occasional/rare nausea, constipation but per facility notes tends to have diarrhea.  Staff indicated that she alternates between the 2.  Stool softener tends to help, diarrhea every 2 weeks which last for several days.  Lower abdominal pain which improves a little bit with a bowel movement.  She stated that she does not receive Imodium when she has diarrhea but would like to as it helps.  Baseline soft blood pressure, 100/59 at her last visit.  Recommended continue current medications, take Movantik when constipated and start Imodium when diarrhea starts.  Follow-up in 6 months.  Today she states   Past Medical History:  Diagnosis Date  . Anxiety   . Arthritis   . Depression    History of psychosis and previous suicide attempt  . DVT, lower extremity, recurrent (HCC)    Long-term Coumadin per Dr. Legrand Rams  . Essential hypertension   . GERD (gastroesophageal reflux disease)   . Hemiplegia ([place]) 2010   Left side  . History of stroke    Acute infarct and right cerebral white matter  small vessel disease 12/10  . Leg DVT (deep venous thromboembolism), acute ([place]) 2006  . Schizophrenia ([place])   . Stroke [facility])    left sided weakness  . Type 2 diabetes mellitus ([place])     Past Surgical History:  Procedure Laterality Date  . BACK SURGERY    . BIOPSY N/A 11/24/2014   Procedure: BIOPSY;  Surgeon: Danie Binder, MD;  Location: [facility];  Service: Endoscopy;  Laterality: N/A;  . BIOPSY  05/17/2016   Procedure: BIOPSY;  Surgeon: Danie Binder, MD;  Location[facility];  Service: Endoscopy;;  gastric biopsy  . COLONOSCOPY WITH PROPOFOL N/A 11/24/2014   Dr. Rudie Meyer polyps removed/moderate sized internal hemorrhoids, tubular adenomas. Next surveillance in 3 years  . ESOPHAGOGASTRODUODENOSCOPY (EGD) WITH PROPOFOL N/A 11/24/2014   Dr. Clayburn Pert HH/patent stricture at the gastroesophageal junction, mild non-erosive gastritis, path negative for H.pylori or celiac sprue  . ESOPHAGOGASTRODUODENOSCOPY (EGD) WITH PROPOFOL N/A 05/17/2016   Procedure: ESOPHAGOGASTRODUODENOSCOPY (EGD) WITH PROPOFOL;  Surgeon: Danie Binder, MD;  Location: [facility];  Service: Endoscopy;  Laterality: N/A;  12:45pm  . GIVENS CAPSULE STUDY N/A 12/11/2014   MULTILPLE EROSION IN the stomach WITH ACTIVE OOZING. OCCASIONAL EROSIONS AND RARE ULCER SEEN IN PROXIMAL SMALL BOWEL . No masses or AVMs SEEN. NO OLD BLOOD OR FRESH BLOOD SEEN.   . POLYPECTOMY N/A 11/24/2014   Procedure: POLYPECTOMY;  Surgeon: Danie Binder, MD;  Location: [facility];  Service: Endoscopy;  Laterality: N/A;  . SAVORY DILATION  N/A 05/17/2016   Procedure: SAVORY DILATION;  Surgeon: Danie Binder, MD;  Location: [facility];  Service: Endoscopy;  Laterality: N/A;    Current Outpatient Medications  Medication Sig Dispense Refill  . acetaminophen (TYLENOL) 325 MG tablet Take 650 mg by mouth every 6 (six) hours as needed. For fever > 101     . ARIPiprazole (ABILIFY) 2 MG tablet Take 2 mg by mouth daily.     . calcium carbonate (TUMS -  DOSED IN MG ELEMENTAL CALCIUM) 500 MG chewable tablet Chew 1 tablet by mouth 4 (four) times daily as needed for indigestion or heartburn.     . clonazePAM (KLONOPIN) 0.5 MG tablet Take 1 tablet (0.5 mg total) by mouth 4 (four) times daily. 30 tablet 0  . DULoxetine (CYMBALTA) 60 MG capsule Take 120 mg by mouth daily.     Marland Kitchen HYDROcodone-acetaminophen (NORCO/VICODIN) 5-325 MG tablet Take 1 tablet by mouth 3 (three) times daily. 30 tablet 0  . Insulin Aspart (NOVOLOG FLEXPEN [place]) Inject 15-21 Units into the skin 3 (three) times daily before meals.    . Insulin Detemir (LEVEMIR FLEXTOUCH) 100 UNIT/ML Pen Inject 30 Units into the skin daily at 10 pm. 15 mL 3  . iron polysaccharides (NIFEREX) 150 MG capsule Take 1 capsule (150 mg total) by mouth daily. 30 capsule 11  . latanoprost (XALATAN) 0.005 % ophthalmic solution Place 1 drop into both eyes at bedtime.    Marland Kitchen loperamide (IMODIUM) 2 MG capsule Take 4 mg by mouth daily as needed for diarrhea or loose stools. Take 2 capsules initially then take 2 after each loose stool per MAR    . metoCLOPramide (REGLAN) 5 MG tablet Take 1 tablet (5 mg total) by mouth every 8 (eight) hours as needed for nausea or vomiting (or bloating). 5 tablet 0  . metoprolol succinate (TOPROL-XL) 25 MG 24 hr tablet Take 0.5 tablets (12.5 mg total) by mouth daily.    . mirtazapine (REMERON) 30 MG tablet Take 30 mg by mouth at bedtime.      . naloxegol oxalate (MOVANTIK) 12.5 MG TABS tablet Take 12.5 mg by mouth daily.    Marland Kitchen NOVOFINE AUTOCOVER 30G X 8 MM MISC     . NOVOLOG FLEXPEN 100 UNIT/ML FlexPen INJECT 15 UNITS SUBCUTANEOUSLY BEFORE MEALS IF BS IS BETWEEN 90-150. 15 mL 3  . omeprazole (PRILOSEC) 40 MG capsule Take 40 mg by mouth daily.    . ondansetron (ZOFRAN) 4 MG tablet Take 1 tablet (4 mg total) by mouth 4 (four) times daily -  before meals and at bedtime. 120 tablet 3  . polyethylene glycol powder (GLYCOLAX/MIRALAX) powder Take 17 g by mouth daily. 255 g 0  . polyvinyl alcohol  (ARTIFICIAL TEARS) 1.4 % ophthalmic solution Place 1 drop into both eyes 3 (three) times daily.    . promethazine (PHENERGAN) 12.5 MG tablet TAKE 1 TABLET BY MOUTH EVERY 6 HOURS AS NEEDED FOR NAUSEA OR VOMITING. 30 tablet 0  . risperiDONE (RISPERDAL) 3 MG tablet Take 3 mg by mouth 2 (two) times daily.     . simvastatin (ZOCOR) 20 MG tablet Take 20 mg by mouth at bedtime.     . solifenacin (VESICARE) 5 MG tablet Take 5 mg by mouth daily.    Marland Kitchen tiZANidine (ZANAFLEX) 2 MG tablet Take 2 mg by mouth daily.    . Vitamin D, Ergocalciferol, (DRISDOL) 50000 UNITS CAPS capsule Take 50,000 Units by mouth every Friday.     . warfarin (COUMADIN) 5  MG tablet TAKE 1/2 TABLET (2.5mg ) BY MOUTH ONCE DAILY ON MON,WED,FRI & SATURDAY. TAKE 1 TABLET (5mg ) ON SUNDAY, TUES,& THURS. 20 tablet 3  . zolpidem (AMBIEN) 10 MG tablet Take 1 tablet (10 mg total) by mouth at bedtime. 5 tablet 0   No current facility-administered medications for this visit.     Allergies as of 04/16/2017 - Review Complete 03/28/2017  Allergen Reaction Noted  . Sulfa antibiotics Rash 02/23/2010  . Sulfonamide derivatives Rash     Family History  Problem Relation Age of Onset  . Hypertension Mother   . Colon cancer Neg Hx     Social History   Socioeconomic History  . Marital status: Widowed    Spouse name: Not on file  . Number of children: Not on file  . Years of education: Not on file  . Highest education level: Not on file  Occupational History  . Not on file  Social Needs  . Financial resource strain: Not on file  . Food insecurity:    Worry: Not on file    Inability: Not on file  . Transportation needs:    Medical: Not on file    Non-medical: Not on file  Tobacco Use  . Smoking status: Former Smoker    Packs/day: 0.25    Years: 20.00    Pack years: 5.00    Types: Cigarettes    Last attempt to quit: 01/24/1995    Years since quitting: 22.2  . Smokeless tobacco: Never Used  Substance and Sexual Activity  . Alcohol  use: No    Alcohol/week: 0.0 oz  . Drug use: No  . Sexual activity: Never    Birth control/protection: Post-menopausal  Lifestyle  . Physical activity:    Days per week: Not on file    Minutes per session: Not on file  . Stress: Not on file  Relationships  . Social connections:    Talks on phone: Not on file    Gets together: Not on file    Attends religious service: Not on file    Active member of club or organization: Not on file    Attends meetings of clubs or organizations: Not on file    Relationship status: Not on file  Other Topics Concern  . Not on file  Social History Narrative  . Not on file    Review of Systems: Complete ROS negative except as per HPI.   Physical Exam: There were no vitals taken for this visit. General:   Alert and oriented. Pleasant and cooperative. Well-nourished and well-developed.  Head:  Normocephalic and atraumatic. Eyes:  Without icterus, sclera clear and conjunctiva pink.  Ears:  Normal auditory acuity. Mouth:  No deformity or lesions, oral mucosa pink.  Throat/Neck:  Supple, without mass or thyromegaly. Cardiovascular:  S1, S2 present without murmurs appreciated. Normal pulses noted. Extremities without clubbing or edema. Respiratory:  Clear to auscultation bilaterally. No wheezes, rales, or rhonchi. No distress.  Gastrointestinal:  +BS, soft, non-tender and non-distended. No HSM noted. No guarding or rebound. No masses appreciated.  Rectal:  Deferred  Musculoskalatal:  Symmetrical without gross deformities. Normal posture. Skin:  Intact without significant lesions or rashes. Neurologic:  Alert and oriented x4;  grossly normal neurologically. Psych:  Alert and cooperative. Normal mood and affect. Heme/Lymph/Immune: No significant cervical adenopathy. No excessive bruising noted.    04/16/2017 8:42 AM   Disclaimer: This note was dictated with voice recognition software. Similar sounding words can inadvertently be transcribed and  may  not be corrected upon review.

## 2017-04-16 NOTE — Telephone Encounter (Signed)
Patient was a no show and letter sent  °

## 2017-04-17 NOTE — Telephone Encounter (Signed)
Noted  

## 2017-04-18 ENCOUNTER — Ambulatory Visit (INDEPENDENT_AMBULATORY_CARE_PROVIDER_SITE_OTHER): Payer: Medicare Other | Admitting: Orthopedic Surgery

## 2017-04-18 ENCOUNTER — Encounter: Payer: Self-pay | Admitting: Orthopedic Surgery

## 2017-04-18 VITALS — BP 97/48 | HR 79 | Resp 16 | Ht 64.0 in

## 2017-04-18 DIAGNOSIS — M1611 Unilateral primary osteoarthritis, right hip: Secondary | ICD-10-CM | POA: Diagnosis not present

## 2017-04-18 DIAGNOSIS — M1711 Unilateral primary osteoarthritis, right knee: Secondary | ICD-10-CM | POA: Diagnosis not present

## 2017-04-18 NOTE — Patient Instructions (Signed)

## 2017-04-18 NOTE — Progress Notes (Signed)
NEW PATIENT OFFICE VISIT   Chief Complaint  Patient presents with  . Knee Pain    right knee pain can not extend, patient non ambulatory    72 year old female with multiple medical problems has weakness of her left lower extremity chronic right knee pain presents for evaluation of exacerbation of right knee pain.  Dementia prevents full history     Review of Systems  Unable to perform ROS: Dementia  All other systems reviewed and are negative.    Past Medical History:  Diagnosis Date  . Anxiety   . Arthritis   . Depression    History of psychosis and previous suicide attempt  . DVT, lower extremity, recurrent (HCC)    Long-term Coumadin per Dr. Legrand Rams  . Essential hypertension   . GERD (gastroesophageal reflux disease)   . Hemiplegia ([place]) 2010   Left side  . History of stroke    Acute infarct and right cerebral white matter small vessel disease 12/10  . Leg DVT (deep venous thromboembolism), acute ([place]) 2006  . Schizophrenia ([place])   . Stroke [facility])    left sided weakness  . Type 2 diabetes mellitus ([place])     Past Surgical History:  Procedure Laterality Date  . BACK SURGERY    . BIOPSY N/A 11/24/2014   Procedure: BIOPSY;  Surgeon: Danie Binder, MD;  Location: [facility];  Service: Endoscopy;  Laterality: N/A;  . BIOPSY  05/17/2016   Procedure: BIOPSY;  Surgeon: Danie Binder, MD;  [facility];  Service: Endoscopy;;  gastric biopsy  . COLONOSCOPY WITH PROPOFOL N/A 11/24/2014   Dr. Rudie Meyer polyps removed/moderate sized internal hemorrhoids, tubular adenomas. Next surveillance in 3 years  . ESOPHAGOGASTRODUODENOSCOPY (EGD) WITH PROPOFOL N/A 11/24/2014   Dr. Clayburn Pert HH/patent stricture at the gastroesophageal junction, mild non-erosive gastritis, path negative for H.pylori or celiac sprue  . ESOPHAGOGASTRODUODENOSCOPY (EGD) WITH PROPOFOL N/A 05/17/2016   Procedure: ESOPHAGOGASTRODUODENOSCOPY (EGD) WITH PROPOFOL;  Surgeon: Danie Binder, MD;  [facility];  Service: Endoscopy;  Laterality: N/A;  12:45pm  . GIVENS CAPSULE STUDY N/A 12/11/2014   MULTILPLE EROSION IN the stomach WITH ACTIVE OOZING. OCCASIONAL EROSIONS AND RARE ULCER SEEN IN PROXIMAL SMALL BOWEL . No masses or AVMs SEEN. NO OLD BLOOD OR FRESH BLOOD SEEN.   . POLYPECTOMY N/A 11/24/2014   Procedure: POLYPECTOMY;  Surgeon: Danie Binder, MD;  Location: [facility];  Service: Endoscopy;  Laterality: N/A;  . SAVORY DILATION N/A 05/17/2016   Procedure: SAVORY DILATION;  Surgeon: Danie Binder, MD;  Location[facility];  Service: Endoscopy;  Laterality: N/A;    Family History  Problem Relation Age of Onset  . Hypertension Mother   . Colon cancer Neg Hx    Social History   Tobacco Use  . Smoking status: Former Smoker    Packs/day: 0.25    Years: 20.00    Pack years: 5.00    Types: Cigarettes    Last attempt to quit: 01/24/1995    Years since quitting: 22.2  . Smokeless tobacco: Never Used  Substance Use Topics  . Alcohol use: No    Alcohol/week: 0.0 oz  . Drug use: No    Allergies  Allergen Reactions  . Sulfa Antibiotics Rash  . Sulfonamide Derivatives Rash    REACTION: rash     Current Meds  Medication Sig  . acetaminophen (TYLENOL) 325 MG tablet Take 650 mg by mouth every 6 (six) hours as needed. For fever > 101   .  ARIPiprazole (ABILIFY) 2 MG tablet Take 2 mg by mouth daily.   . calcium carbonate (TUMS - DOSED IN MG ELEMENTAL CALCIUM) 500 MG chewable tablet Chew 1 tablet by mouth 4 (four) times daily as needed for indigestion or heartburn.   . clonazePAM (KLONOPIN) 0.5 MG tablet Take 1 tablet (0.5 mg total) by mouth 4 (four) times daily.  . DULoxetine (CYMBALTA) 60 MG capsule Take 120 mg by mouth daily.   Marland Kitchen HYDROcodone-acetaminophen (NORCO/VICODIN) 5-325 MG tablet Take 1 tablet by mouth 3 (three) times daily.  . Insulin Aspart (NOVOLOG FLEXPEN [place]) Inject 15-21 Units into the skin 3 (three) times daily before meals.  . Insulin Detemir (LEVEMIR FLEXTOUCH)  100 UNIT/ML Pen Inject 30 Units into the skin daily at 10 pm.  . iron polysaccharides (NIFEREX) 150 MG capsule Take 1 capsule (150 mg total) by mouth daily.  Marland Kitchen latanoprost (XALATAN) 0.005 % ophthalmic solution Place 1 drop into both eyes at bedtime.  Marland Kitchen loperamide (IMODIUM) 2 MG capsule Take 4 mg by mouth daily as needed for diarrhea or loose stools. Take 2 capsules initially then take 2 after each loose stool per MAR  . metoCLOPramide (REGLAN) 5 MG tablet Take 1 tablet (5 mg total) by mouth every 8 (eight) hours as needed for nausea or vomiting (or bloating).  . metoprolol succinate (TOPROL-XL) 25 MG 24 hr tablet Take 0.5 tablets (12.5 mg total) by mouth daily.  . midodrine (PROAMATINE) 2.5 MG tablet   . mirtazapine (REMERON) 30 MG tablet Take 30 mg by mouth at bedtime.    . naloxegol oxalate (MOVANTIK) 12.5 MG TABS tablet Take 12.5 mg by mouth daily.  Marland Kitchen NOVOFINE AUTOCOVER 30G X 8 MM MISC   . NOVOLOG FLEXPEN 100 UNIT/ML FlexPen INJECT 15 UNITS SUBCUTANEOUSLY BEFORE MEALS IF BS IS BETWEEN 90-150.  Marland Kitchen omeprazole (PRILOSEC) 40 MG capsule Take 40 mg by mouth daily.  . ondansetron (ZOFRAN) 4 MG tablet Take 1 tablet (4 mg total) by mouth 4 (four) times daily -  before meals and at bedtime.  . polyethylene glycol powder (GLYCOLAX/MIRALAX) powder Take 17 g by mouth daily.  . polyvinyl alcohol (ARTIFICIAL TEARS) 1.4 % ophthalmic solution Place 1 drop into both eyes 3 (three) times daily.  . promethazine (PHENERGAN) 12.5 MG tablet TAKE 1 TABLET BY MOUTH EVERY 6 HOURS AS NEEDED FOR NAUSEA OR VOMITING.  . risperiDONE (RISPERDAL) 3 MG tablet Take 3 mg by mouth 2 (two) times daily.   . simvastatin (ZOCOR) 20 MG tablet Take 20 mg by mouth at bedtime.   . solifenacin (VESICARE) 5 MG tablet Take 5 mg by mouth daily.  Marland Kitchen tiZANidine (ZANAFLEX) 2 MG tablet Take 2 mg by mouth daily.  . Vitamin D, Ergocalciferol, (DRISDOL) 50000 UNITS CAPS capsule Take 50,000 Units by mouth every Friday.   . warfarin (COUMADIN) 5 MG  tablet TAKE 1/2 TABLET (2.5mg ) BY MOUTH ONCE DAILY ON MON,WED,FRI & SATURDAY. TAKE 1 TABLET (5mg ) ON SUNDAY, TUES,& THURS.  Marland Kitchen zolpidem (AMBIEN) 10 MG tablet Take 1 tablet (10 mg total) by mouth at bedtime.    BP (!) 97/48   Pulse 79   Resp 16   Ht 5\' 4"  (1.626 m)   BMI 26.49 kg/m   Physical Exam  Constitutional: She is oriented to person, place, and time. She appears well-developed and well-nourished.  Musculoskeletal:       Right knee: She exhibits no effusion.  Neurological: She is alert and oriented to person, place, and time. She displays no atrophy and  no tremor. She exhibits abnormal muscle tone. Coordination and gait abnormal.  Psychiatric: Her affect is blunt and labile. She is withdrawn. Cognition and memory are impaired. She is noncommunicative.  Vitals reviewed.   Right Knee Exam   Muscle Strength  The patient has normal right knee strength.  Tenderness  The patient is experiencing no tenderness.   Range of Motion  Extension: normal Right knee extension: 50.  Flexion: normal   Tests  [place]ray:  Medial - negative Lateral - negative Varus: negative Valgus: negative Drawer:  Anterior - negative    Posterior - negative  Other  Erythema: absent Scars: absent Sensation: normal Pulse: present Swelling: none Effusion: no effusion present   Left Knee Exam   Tenderness  The patient is experiencing no tenderness.   Range of Motion  Extension: abnormal  Flexion: abnormal   Tests  McMurray:  Medial - negative Lateral - negative Varus: negative Valgus: negative Drawer:  Anterior - negative     Posterior - negative  Other  Erythema: absent Scars: absent Sensation: normal Pulse: present Swelling: none      MEDICAL DECISION SECTION  xrays ordered? no  My independent reading of xrays: 4 views right knee  Knee is in valgus alignment probably 10 or 12 degrees.  Symmetric joint space narrowing seen in both sides.  Minimal osteophyte formation there  appears to be some osteopenia  Impression valgus with medial and lateral compartment degenerative arthritis   Encounter Diagnosis  Name Primary?  . Primary osteoarthritis of right knee Yes   PLAN:  The patient is not a surgical candidate.  We did give her an injection in the right knee to hopefully give her some relief she is already on hydrocodone.  She takes Coumadin so we will not recommend any anti-inflammatory  Procedure note right knee injection verbal consent was obtained to inject right knee joint  Timeout was completed to confirm the site of injection  The medications used were 40 mg of Depo-Medrol and 1% lidocaine 3 cc  Anesthesia was provided by ethyl chloride and the skin was prepped with alcohol.  After cleaning the skin with alcohol a 20-gauge needle was used to inject the right knee joint. There were no complications. A sterile bandage was applied.   Injection? yes MRI/CT/? no

## 2017-04-26 ENCOUNTER — Other Ambulatory Visit: Payer: Self-pay | Admitting: "Endocrinology

## 2017-05-07 DIAGNOSIS — I951 Orthostatic hypotension: Secondary | ICD-10-CM | POA: Diagnosis not present

## 2017-05-09 ENCOUNTER — Ambulatory Visit (INDEPENDENT_AMBULATORY_CARE_PROVIDER_SITE_OTHER): Payer: Medicare Other | Admitting: *Deleted

## 2017-05-09 DIAGNOSIS — Z8679 Personal history of other diseases of the circulatory system: Secondary | ICD-10-CM | POA: Diagnosis not present

## 2017-05-09 DIAGNOSIS — Z5181 Encounter for therapeutic drug level monitoring: Secondary | ICD-10-CM | POA: Diagnosis not present

## 2017-05-09 DIAGNOSIS — I82409 Acute embolism and thrombosis of unspecified deep veins of unspecified lower extremity: Secondary | ICD-10-CM

## 2017-05-09 LAB — POCT INR: INR: 2.3

## 2017-05-09 NOTE — Patient Instructions (Signed)
Continue coumadin 2.5mg  daily except 5mg  on Sundays, Tuesdays and Thursdays Recheck in 4 weeks

## 2017-05-15 DIAGNOSIS — M79675 Pain in left toe(s): Secondary | ICD-10-CM | POA: Diagnosis not present

## 2017-05-15 DIAGNOSIS — B351 Tinea unguium: Secondary | ICD-10-CM | POA: Diagnosis not present

## 2017-05-15 DIAGNOSIS — M79674 Pain in right toe(s): Secondary | ICD-10-CM | POA: Diagnosis not present

## 2017-05-17 ENCOUNTER — Other Ambulatory Visit: Payer: Self-pay | Admitting: Gastroenterology

## 2017-05-17 DIAGNOSIS — I825Y9 Chronic embolism and thrombosis of unspecified deep veins of unspecified proximal lower extremity: Secondary | ICD-10-CM | POA: Diagnosis not present

## 2017-05-17 DIAGNOSIS — F333 Major depressive disorder, recurrent, severe with psychotic symptoms: Secondary | ICD-10-CM | POA: Diagnosis not present

## 2017-05-17 DIAGNOSIS — E1165 Type 2 diabetes mellitus with hyperglycemia: Secondary | ICD-10-CM | POA: Diagnosis not present

## 2017-05-17 DIAGNOSIS — G8190 Hemiplegia, unspecified affecting unspecified side: Secondary | ICD-10-CM | POA: Diagnosis not present

## 2017-05-17 DIAGNOSIS — I10 Essential (primary) hypertension: Secondary | ICD-10-CM | POA: Diagnosis not present

## 2017-06-06 ENCOUNTER — Ambulatory Visit (INDEPENDENT_AMBULATORY_CARE_PROVIDER_SITE_OTHER): Payer: Medicare Other | Admitting: *Deleted

## 2017-06-06 DIAGNOSIS — Z8679 Personal history of other diseases of the circulatory system: Secondary | ICD-10-CM | POA: Diagnosis not present

## 2017-06-06 DIAGNOSIS — I82409 Acute embolism and thrombosis of unspecified deep veins of unspecified lower extremity: Secondary | ICD-10-CM | POA: Diagnosis not present

## 2017-06-06 DIAGNOSIS — Z5181 Encounter for therapeutic drug level monitoring: Secondary | ICD-10-CM

## 2017-06-06 LAB — POCT INR: INR: 2.2

## 2017-06-06 NOTE — Patient Instructions (Signed)
Continue coumadin 2.5mg  daily except 5mg  on Sundays, Tuesdays and Thursdays Recheck in 6 weeks

## 2017-06-14 DIAGNOSIS — E119 Type 2 diabetes mellitus without complications: Secondary | ICD-10-CM | POA: Diagnosis not present

## 2017-06-14 DIAGNOSIS — H524 Presbyopia: Secondary | ICD-10-CM | POA: Diagnosis not present

## 2017-06-14 DIAGNOSIS — Z7984 Long term (current) use of oral hypoglycemic drugs: Secondary | ICD-10-CM | POA: Diagnosis not present

## 2017-06-14 DIAGNOSIS — H25813 Combined forms of age-related cataract, bilateral: Secondary | ICD-10-CM | POA: Diagnosis not present

## 2017-06-14 DIAGNOSIS — Z794 Long term (current) use of insulin: Secondary | ICD-10-CM | POA: Diagnosis not present

## 2017-06-15 DIAGNOSIS — F419 Anxiety disorder, unspecified: Secondary | ICD-10-CM | POA: Diagnosis not present

## 2017-06-15 DIAGNOSIS — F209 Schizophrenia, unspecified: Secondary | ICD-10-CM | POA: Diagnosis not present

## 2017-06-15 DIAGNOSIS — Z794 Long term (current) use of insulin: Secondary | ICD-10-CM | POA: Diagnosis not present

## 2017-06-15 DIAGNOSIS — E119 Type 2 diabetes mellitus without complications: Secondary | ICD-10-CM | POA: Diagnosis not present

## 2017-06-15 DIAGNOSIS — I69354 Hemiplegia and hemiparesis following cerebral infarction affecting left non-dominant side: Secondary | ICD-10-CM | POA: Diagnosis not present

## 2017-06-15 DIAGNOSIS — R259 Unspecified abnormal involuntary movements: Secondary | ICD-10-CM | POA: Diagnosis not present

## 2017-06-21 DIAGNOSIS — R259 Unspecified abnormal involuntary movements: Secondary | ICD-10-CM | POA: Diagnosis not present

## 2017-06-21 DIAGNOSIS — F419 Anxiety disorder, unspecified: Secondary | ICD-10-CM | POA: Diagnosis not present

## 2017-06-21 DIAGNOSIS — I69354 Hemiplegia and hemiparesis following cerebral infarction affecting left non-dominant side: Secondary | ICD-10-CM | POA: Diagnosis not present

## 2017-06-21 DIAGNOSIS — F209 Schizophrenia, unspecified: Secondary | ICD-10-CM | POA: Diagnosis not present

## 2017-06-21 DIAGNOSIS — E119 Type 2 diabetes mellitus without complications: Secondary | ICD-10-CM | POA: Diagnosis not present

## 2017-06-21 DIAGNOSIS — Z794 Long term (current) use of insulin: Secondary | ICD-10-CM | POA: Diagnosis not present

## 2017-06-22 DIAGNOSIS — E119 Type 2 diabetes mellitus without complications: Secondary | ICD-10-CM | POA: Diagnosis not present

## 2017-06-22 DIAGNOSIS — I69354 Hemiplegia and hemiparesis following cerebral infarction affecting left non-dominant side: Secondary | ICD-10-CM | POA: Diagnosis not present

## 2017-06-22 DIAGNOSIS — F419 Anxiety disorder, unspecified: Secondary | ICD-10-CM | POA: Diagnosis not present

## 2017-06-22 DIAGNOSIS — Z794 Long term (current) use of insulin: Secondary | ICD-10-CM | POA: Diagnosis not present

## 2017-06-22 DIAGNOSIS — R259 Unspecified abnormal involuntary movements: Secondary | ICD-10-CM | POA: Diagnosis not present

## 2017-06-22 DIAGNOSIS — F209 Schizophrenia, unspecified: Secondary | ICD-10-CM | POA: Diagnosis not present

## 2017-06-27 ENCOUNTER — Encounter (HOSPITAL_COMMUNITY): Payer: Self-pay | Admitting: Emergency Medicine

## 2017-06-27 ENCOUNTER — Emergency Department (HOSPITAL_COMMUNITY)
Admission: EM | Admit: 2017-06-27 | Discharge: 2017-06-27 | Disposition: A | Discharge status: Home / Self Care | Payer: Medicare Other | Attending: Emergency Medicine | Admitting: Emergency Medicine

## 2017-06-27 ENCOUNTER — Emergency Department (HOSPITAL_COMMUNITY): Payer: Medicare Other

## 2017-06-27 ENCOUNTER — Other Ambulatory Visit: Payer: Self-pay

## 2017-06-27 DIAGNOSIS — R1013 Epigastric pain: Secondary | ICD-10-CM | POA: Diagnosis not present

## 2017-06-27 DIAGNOSIS — E1165 Type 2 diabetes mellitus with hyperglycemia: Secondary | ICD-10-CM | POA: Diagnosis not present

## 2017-06-27 DIAGNOSIS — I10 Essential (primary) hypertension: Secondary | ICD-10-CM | POA: Insufficient documentation

## 2017-06-27 DIAGNOSIS — Z794 Long term (current) use of insulin: Secondary | ICD-10-CM | POA: Insufficient documentation

## 2017-06-27 DIAGNOSIS — E119 Type 2 diabetes mellitus without complications: Secondary | ICD-10-CM | POA: Insufficient documentation

## 2017-06-27 DIAGNOSIS — Z86718 Personal history of other venous thrombosis and embolism: Secondary | ICD-10-CM | POA: Insufficient documentation

## 2017-06-27 DIAGNOSIS — I951 Orthostatic hypotension: Secondary | ICD-10-CM | POA: Diagnosis not present

## 2017-06-27 DIAGNOSIS — Z79899 Other long term (current) drug therapy: Secondary | ICD-10-CM | POA: Diagnosis not present

## 2017-06-27 DIAGNOSIS — I6930 Unspecified sequelae of cerebral infarction: Secondary | ICD-10-CM | POA: Insufficient documentation

## 2017-06-27 DIAGNOSIS — Z87891 Personal history of nicotine dependence: Secondary | ICD-10-CM | POA: Diagnosis not present

## 2017-06-27 DIAGNOSIS — R531 Weakness: Secondary | ICD-10-CM | POA: Diagnosis not present

## 2017-06-27 HISTORY — DX: Dependence on wheelchair: Z99.3

## 2017-06-27 LAB — URINALYSIS, ROUTINE W REFLEX MICROSCOPIC
Bilirubin Urine: NEGATIVE
Glucose, UA: NEGATIVE mg/dL
Hgb urine dipstick: NEGATIVE
Ketones, ur: NEGATIVE mg/dL
Leukocytes, UA: NEGATIVE
Nitrite: NEGATIVE
Protein, ur: NEGATIVE mg/dL
Specific Gravity, Urine: 1.013 (ref 1.005–1.030)
pH: 7 (ref 5.0–8.0)

## 2017-06-27 LAB — TROPONIN I: Troponin I: <0.03 ng/mL (ref <0.03)

## 2017-06-27 LAB — CBC WITH DIFFERENTIAL/PLATELET
Basophils Absolute: 0 10*3/uL (ref 0.0–0.1)
Basophils Relative: 0 %
Eosinophils Absolute: 0.1 10*3/uL (ref 0.0–0.7)
Eosinophils Relative: 2 %
HCT: 40.8 % (ref 36.0–46.0)
Hemoglobin: 13.8 g/dL (ref 12.0–15.0)
Lymphocytes Relative: 27 %
Lymphs Abs: 1.8 10*3/uL (ref 0.7–4.0)
MCH: 30.1 pg (ref 26.0–34.0)
MCHC: 33.8 g/dL (ref 30.0–36.0)
MCV: 88.9 fL (ref 78.0–100.0)
Monocytes Absolute: 0.5 10*3/uL (ref 0.1–1.0)
Monocytes Relative: 8 %
Neutro Abs: 4.3 10*3/uL (ref 1.7–7.7)
Neutrophils Relative %: 63 %
Platelets: 250 10*3/uL (ref 150–400)
RBC: 4.59 MIL/uL (ref 3.87–5.11)
RDW: 12.8 % (ref 11.5–15.5)
WBC: 6.8 10*3/uL (ref 4.0–10.5)

## 2017-06-27 LAB — I-STAT CG4 LACTIC ACID, ED: Lactic Acid, Venous: 1.49 mmol/L (ref 0.5–1.9)

## 2017-06-27 LAB — COMPREHENSIVE METABOLIC PANEL
ALT: 11 U/L — ABNORMAL LOW (ref 14–54)
AST: 15 U/L (ref 15–41)
Albumin: 3.8 g/dL (ref 3.5–5.0)
Alkaline Phosphatase: 76 U/L (ref 38–126)
Anion gap: 7 (ref 5–15)
BUN: 11 mg/dL (ref 6–20)
CO2: 30 mmol/L (ref 22–32)
Calcium: 9.3 mg/dL (ref 8.9–10.3)
Chloride: 101 mmol/L (ref 101–111)
Creatinine, Ser: 0.74 mg/dL (ref 0.44–1.00)
GFR calc Af Amer: >60 mL/min (ref >60)
GFR calc non Af Amer: >60 mL/min (ref >60)
Glucose, Bld: 94 mg/dL (ref 65–99)
Potassium: 3.7 mmol/L (ref 3.5–5.1)
Sodium: 138 mmol/L (ref 135–145)
Total Bilirubin: 0.5 mg/dL (ref 0.3–1.2)
Total Protein: 7.1 g/dL (ref 6.5–8.1)

## 2017-06-27 LAB — LIPASE, BLOOD: Lipase: 20 U/L (ref 11–51)

## 2017-06-27 LAB — PROTIME-INR
INR: 1.7
Prothrombin Time: 19.8 seconds — ABNORMAL HIGH (ref 11.4–15.2)

## 2017-06-27 MED ORDER — ACETAMINOPHEN 325 MG PO TABS: 650.0000 mg | ORAL_TABLET | Freq: Once | ORAL | Status: DC

## 2017-06-27 MED ORDER — SODIUM CHLORIDE 0.9 % IV BOLUS
500.0000 mL | Freq: Once | INTRAVENOUS | Status: AC
  Administered 2017-06-27: 500 mL via INTRAVENOUS

## 2017-06-27 NOTE — Discharge Instructions (Addendum)
Take your usual prescriptions as previously directed. Increase your fluid intake for the next several days. Move slowly when changing positions. Call your regular medical doctor today to schedule a follow up appointment within the next 2 days.  Return to the Emergency Department immediately sooner if worsening.

## 2017-06-27 NOTE — ED Provider Notes (Signed)
[facility] EMERGENCY DEPARTMENT Provider Note   CSN: [id number] Arrival date & time: 06/27/17  1012     History   Chief Complaint Chief Complaint  Patient presents with  . Hypotension    HPI [name] is a 72 y.o. female.  HPI  Pt was seen at 1045. Per pt report, c/o gradual onset and persistence of constant generalized weakness for the past several days. Pt was evaluated by her PMD PTA, then sent to the ED for "BP 80/40." Pt states that she has had mid-epigastric "pains" for the past 2+ weeks. Pain is described as "aching." Denies N/V/D, no back pain, no CP/palpitations, no SOB/cough, no fevers, no rash, no injury, no new focal motor weakness.   Past Medical History:  Diagnosis Date  . Anxiety   . Arthritis   . Dependence on wheelchair    pivot/transfers  . Depression    History of psychosis and previous suicide attempt  . DVT, lower extremity, recurrent (HCC)    Long-term Coumadin per Dr. Legrand Rams  . Essential hypertension   . GERD (gastroesophageal reflux disease)   . Hemiplegia ([place]) 2010   Left side  . History of stroke    Acute infarct and right cerebral white matter small vessel disease 12/10  . Leg DVT (deep venous thromboembolism), acute ([place]) 2006  . Schizophrenia ([place])   . Stroke [facility])    left sided weakness  . Type 2 diabetes mellitus [facility])     Patient Active Problem List   Diagnosis Date Noted  . Primary osteoarthritis of right hip 04/18/2017  . Orthostasis 03/28/2017  . General weakness 03/28/2017  . History of cerebrovascular accident (CVA) with residual deficit 03/28/2017  . Schizophrenia ([place]) 03/28/2017  . Generalized weakness 03/28/2017  . Orthostatic hypotension   . IBS (irritable bowel syndrome) 10/16/2016  . Left hemiparesis ([place]) 10/06/2016  . Left-sided weakness 10/06/2016  . Chronic superficial gastritis without bleeding   . Stricture and stenosis of esophagus   . Nausea with vomiting 04/20/2016  . Abdominal pain, acute,  bilateral lower quadrant 03/22/2016  . Lesion of right lobe of liver 03/22/2016  . Elevated troponin 02/13/2016  . IDA (iron deficiency anemia) 04/06/2015  . Mixed hyperlipidemia 11/06/2014  . Obesity due to excess calories 11/06/2014  . Sedentary lifestyle 11/06/2014  . Rectal bleeding 10/21/2014  . Encounter for therapeutic drug monitoring 03/24/2013  . Chronic anticoagulation 04/09/2010  . Type 2 diabetes mellitus with vascular disease ([place]) 03/24/2010  . Essential hypertension, benign 03/24/2010  . DVT, lower extremity, recurrent ([place]) 03/24/2010  . History of cardiovascular disorder 03/24/2010    Past Surgical History:  Procedure Laterality Date  . BACK SURGERY    . BIOPSY N/A 11/24/2014   Procedure: BIOPSY;  Surgeon: Danie Binder, MD;  Location: [facility] ORS;  Service: Endoscopy;  Laterality: N/A;  . BIOPSY  05/17/2016   Procedure: BIOPSY;  Surgeon: Danie Binder, MD;  Location: [facility] ENDO SUITE;  Service: Endoscopy;;  gastric biopsy  . COLONOSCOPY WITH PROPOFOL N/A 11/24/2014   Dr. Rudie Meyer polyps removed/moderate sized internal hemorrhoids, tubular adenomas. Next surveillance in 3 years  . ESOPHAGOGASTRODUODENOSCOPY (EGD) WITH PROPOFOL N/A 11/24/2014   Dr. Clayburn Pert HH/patent stricture at the gastroesophageal junction, mild non-erosive gastritis, path negative for H.pylori or celiac sprue  . ESOPHAGOGASTRODUODENOSCOPY (EGD) WITH PROPOFOL N/A 05/17/2016   Procedure: ESOPHAGOGASTRODUODENOSCOPY (EGD) WITH PROPOFOL;  Surgeon: Danie Binder, MD;  Location: [facility] ENDO SUITE;  Service: Endoscopy;  Laterality: N/A;  12:45pm  . GIVENS  CAPSULE STUDY N/A 12/11/2014   MULTILPLE EROSION IN the stomach WITH ACTIVE OOZING. OCCASIONAL EROSIONS AND RARE ULCER SEEN IN PROXIMAL SMALL BOWEL . No masses or AVMs SEEN. NO OLD BLOOD OR FRESH BLOOD SEEN.   . POLYPECTOMY N/A 11/24/2014   Procedure: POLYPECTOMY;  Surgeon: Danie Binder, MD;  Location: [facility] ORS;  Service: Endoscopy;  Laterality: N/A;  . SAVORY  DILATION N/A 05/17/2016   Procedure: SAVORY DILATION;  Surgeon: Danie Binder, MD;  Location: [facility] ENDO SUITE;  Service: Endoscopy;  Laterality: N/A;     OB History   None      Home Medications    Prior to Admission medications   Medication Sig Start Date End Date Taking? Authorizing Provider  AMITIZA 8 MCG capsule Take 8 mcg by mouth daily with breakfast.  06/11/17  Yes [provider]  cholecalciferol (VITAMIN D) 1000 units tablet Take 2,000 Units by mouth daily.   Yes [provider]  clonazePAM (KLONOPIN) 0.5 MG tablet Take 1 tablet (0.5 mg total) by mouth 4 (four) times daily. 03/30/17  Yes Johnson, Clanford L, MD  DULoxetine (CYMBALTA) 60 MG capsule Take 120 mg by mouth daily.    Yes [provider]  HYDROcodone-acetaminophen (NORCO/VICODIN) 5-325 MG tablet Take 1 tablet by mouth 3 (three) times daily. 03/30/17  Yes Johnson, Clanford L, MD  hydrocortisone (ANUSOL-HC) 2.5 % rectal cream Place 1 application rectally 2 (two) times daily.   Yes [provider]  Insulin Aspart (NOVOLOG FLEXPEN [place]) Inject 15-21 Units into the skin 3 (three) times daily before meals.   Yes [provider]  iron polysaccharides (NIFEREX) 150 MG capsule Take 1 capsule (150 mg total) by mouth daily. 04/10/16  Yes Baird Cancer, PA-C  metoprolol succinate (TOPROL-XL) 25 MG 24 hr tablet Take 0.5 tablets (12.5 mg total) by mouth daily. 03/31/17  Yes Johnson, Clanford L, MD  midodrine (PROAMATINE) 2.5 MG tablet Take 2.5 mg by mouth 3 (three) times daily with meals.  04/09/17  Yes [provider]  NOVOLOG FLEXPEN 100 UNIT/ML FlexPen INJECT 15 UNITS SUBCUTANEOUSLY BEFORE MEALS IF BS IS BETWEEN 90-150. 04/13/17  Yes Nida, Marella Chimes, MD  omeprazole (PRILOSEC) 40 MG capsule Take 40 mg by mouth daily.   Yes [provider]  polyethylene glycol powder (GLYCOLAX/MIRALAX) powder Take 17 g by mouth daily. 03/30/17  Yes Johnson, Clanford L, MD  polyvinyl alcohol  (ARTIFICIAL TEARS) 1.4 % ophthalmic solution Place 1 drop into both eyes 3 (three) times daily.   Yes [provider]  risperiDONE (RISPERDAL) 3 MG tablet Take 3 mg by mouth 2 (two) times daily.    Yes [provider]  solifenacin (VESICARE) 5 MG tablet Take 5 mg by mouth daily.   Yes [provider]  tiZANidine (ZANAFLEX) 2 MG tablet Take 2 mg by mouth daily.   Yes [provider]  acetaminophen (TYLENOL) 325 MG tablet Take 650 mg by mouth every 6 (six) hours as needed. For fever > 101     [provider]  ARIPiprazole (ABILIFY) 2 MG tablet Take 2 mg by mouth daily.  08/31/15   [provider]  calcium carbonate (TUMS - DOSED IN MG ELEMENTAL CALCIUM) 500 MG chewable tablet Chew 1 tablet by mouth 4 (four) times daily as needed for indigestion or heartburn.     [provider]  docusate sodium (COLACE) 100 MG capsule Take 100 mg by mouth 2 (two) times daily.    [provider]  Montine Circle  10 GM/15ML SOLN Take 20 g by mouth 2 (two) times daily as needed.  06/07/17   [provider]  Insulin Detemir (LEVEMIR FLEXTOUCH) 100 UNIT/ML Pen Inject 30 Units into the skin daily at 10 pm. 03/30/17   Johnson, Clanford L, MD  latanoprost (XALATAN) 0.005 % ophthalmic solution Place 1 drop into both eyes at bedtime.    [provider]  LEVEMIR FLEXTOUCH 100 UNIT/ML Pen INJECT 50 UNITS SUBCUTANEOUSLY AT BEDTIME. 04/27/17   Nida, Marella Chimes, MD  loperamide (IMODIUM) 2 MG capsule Take 4 mg by mouth daily as needed for diarrhea or loose stools. Take 2 capsules initially then take 2 after each loose stool per [facility]    [provider]  mirtazapine (REMERON) 30 MG tablet Take 30 mg by mouth at bedtime.      [provider]  naloxegol oxalate (MOVANTIK) 12.5 MG TABS tablet Take 12.5 mg by mouth daily.    [provider]  ondansetron (ZOFRAN) 4 MG tablet Take 1 tablet (4 mg total) by mouth 4 (four) times daily -   before meals and at bedtime. 04/20/16   Annitta Needs, NP  simvastatin (ZOCOR) 20 MG tablet Take 20 mg by mouth at bedtime.  03/31/16   [provider]  warfarin (COUMADIN) 5 MG tablet TAKE 1/2 TABLET (2.5mg ) BY MOUTH ONCE DAILY ON MON,WED,FRI & SATURDAY. TAKE 1 TABLET (5mg ) ON SUNDAY, TUES,& THURS. 04/05/17   Satira Sark, MD  zolpidem (AMBIEN) 10 MG tablet Take 1 tablet (10 mg total) by mouth at bedtime. 10/07/16   Theodis Blaze, MD    Family History Family History  Problem Relation Age of Onset  . Hypertension Mother   . Colon cancer Neg Hx     Social History Social History   Tobacco Use  . Smoking status: Former Smoker    Packs/day: 0.25    Years: 20.00    Pack years: 5.00    Types: Cigarettes    Last attempt to quit: 01/24/1995    Years since quitting: 22.4  . Smokeless tobacco: Never Used  Substance Use Topics  . Alcohol use: No    Alcohol/week: 0.0 oz  . Drug use: No     Allergies   Sulfa antibiotics and Sulfonamide derivatives   Review of Systems Review of Systems ROS: Statement: All systems negative except as marked or noted in the HPI; Constitutional: Negative for fever and chills. +generalized weakness/fatigue. ; ; Eyes: Negative for eye pain, redness and discharge. ; ; ENMT: Negative for ear pain, hoarseness, nasal congestion, sinus pressure and sore throat. ; ; Cardiovascular: Negative for chest pain, palpitations, diaphoresis, dyspnea and peripheral edema. ; ; Respiratory: Negative for cough, wheezing and stridor. ; ; Gastrointestinal: +abd pain. Negative for nausea, vomiting, diarrhea, blood in stool, hematemesis, jaundice and rectal bleeding. . ; ; Genitourinary: Negative for dysuria, flank pain and hematuria. ; ; Musculoskeletal: Negative for back pain and neck pain. Negative for swelling and trauma.; ; Skin: Negative for pruritus, rash, abrasions, blisters, bruising and skin lesion.; ; Neuro: Negative for headache, lightheadedness and neck stiffness.  Negative for altered level of consciousness, altered mental status, extremity weakness, paresthesias, involuntary movement, seizure and syncope.       Physical Exam Updated Vital Signs BP (!) 90/58 (BP Location: Right Arm)   Pulse 86   Temp 98.2 F (36.8 C) (Oral)   Resp 14   Wt 62.6 kg (138 lb)   SpO2 94%   BMI 23.69 kg/m  Patient Vitals for the past 24 hrs:  BP Temp Temp src Pulse Resp SpO2 Weight  06/27/17 1200 (!) 126/55 - - 73 (!) 25 96 % -  06/27/17 1130 131/68 - - - - - -  06/27/17 1100 (!) 103/56 - - 69 - 92 % -  06/27/17 1030 129/67 - - 69 - 92 % -  06/27/17 1017 (!) 90/58 98.2 F (36.8 C) Oral 86 14 94 % 62.6 kg (138 lb)    11:31:50 Orthostatic Vital Signs JC  Orthostatic Lying   BP- Lying: 103/49   Pulse- Lying: 69       Orthostatic Sitting  BP- Sitting: 116/53   Pulse- Sitting: 79       Orthostatic Standing at 0 minutes  BP- Standing at 0 minutes: 117/63   Pulse- Standing at 0 minutes: 84    12:42:49 Orthostatic Vital Signs JC  Orthostatic Lying   BP- Lying: 127/57   Pulse- Lying: 77       Orthostatic Sitting  BP- Sitting: 114/63   Pulse- Sitting: 77       Orthostatic Standing at 0 minutes  BP- Standing at 0 minutes: 116/54   Pulse- Standing at 0 minutes: 87     Physical Exam 1050: Physical examination:  Nursing notes reviewed; Vital signs and O2 SAT reviewed;  Constitutional: Well developed, Well nourished, In no acute distress; Head:  Normocephalic, atraumatic; Eyes: EOMI, PERRL, No scleral icterus; ENMT: Mouth and pharynx normal, Mucous membranes dry; Neck: Supple, Full range of motion, No lymphadenopathy; Cardiovascular: Regular rate and rhythm, No gallop; Respiratory: Breath sounds clear & equal bilaterally, No wheezes.  Speaking full sentences with ease, Normal respiratory effort/excursion; Chest: Nontender, Movement normal; Abdomen: Soft, +mild mid-epigastric tenderness to palp. No rebound or guarding.  Nondistended, Normal bowel  sounds; Genitourinary: No CVA tenderness; Extremities: Peripheral pulses normal, No tenderness, No edema, No calf edema or asymmetry.; Neuro: AA&Ox3, Major CN grossly intact. No facial droop. Speech clear. +left sided weakness per hx, otherwise no new gross focal motor deficits in extremities.; Skin: Color normal, Warm, Dry.   ED Treatments / Results  Labs (all labs ordered are listed, but only abnormal results are displayed)   EKG EKG Interpretation  Date/Time:  Wednesday June 27 2017 11:39:10 EDT Ventricular Rate:  72 PR Interval:    QRS Duration: 92 QT Interval:  426 QTC Calculation: 467 R Axis:   76 Text Interpretation:  Sinus rhythm Probable inferior infarct, old Inferior infarct , old When compared with ECG of 03/28/2017 No significant change was found Confirmed by Francine Graven [phone]) on 06/27/2017 12:10:41 PM   Radiology   Procedures Procedures (including critical care time)  Medications Ordered in ED Medications - No data to display   Initial Impression / Assessment and Plan / ED Course  I have reviewed the triage vital signs and the nursing notes.  Pertinent labs & imaging results that were available during my care of the patient were reviewed by me and considered in my medical decision making (see chart for details).  MDM Reviewed: previous chart, nursing note and vitals Reviewed previous: labs and ECG Interpretation: labs, ECG and x-ray   Results for orders placed or performed during the hospital encounter of 06/27/17  Urinalysis, Routine w reflex microscopic  Result Value Ref Range   Color, Urine YELLOW YELLOW   APPearance CLEAR CLEAR   Specific Gravity, Urine 1.013 1.005 - 1.030   pH 7.0 5.0 - 8.0   Glucose, UA NEGATIVE NEGATIVE mg/dL  Hgb urine dipstick NEGATIVE NEGATIVE   Bilirubin Urine NEGATIVE NEGATIVE   Ketones, ur NEGATIVE NEGATIVE mg/dL   Protein, ur NEGATIVE NEGATIVE mg/dL   Nitrite NEGATIVE NEGATIVE   Leukocytes, UA NEGATIVE NEGATIVE    Comprehensive metabolic panel  Result Value Ref Range   Sodium 138 135 - 145 mmol/L   Potassium 3.7 3.5 - 5.1 mmol/L   Chloride 101 101 - 111 mmol/L   CO2 30 22 - 32 mmol/L   Glucose, Bld 94 65 - 99 mg/dL   BUN 11 6 - 20 mg/dL   Creatinine, Ser 0.74 0.44 - 1.00 mg/dL   Calcium 9.3 8.9 - 10.3 mg/dL   Total Protein 7.1 6.5 - 8.1 g/dL   Albumin 3.8 3.5 - 5.0 g/dL   AST 15 15 - 41 U/L   ALT 11 (L) 14 - 54 U/L   Alkaline Phosphatase 76 38 - 126 U/L   Total Bilirubin 0.5 0.3 - 1.2 mg/dL   GFR calc non Af Amer >60 >60 mL/min   GFR calc Af Amer >60 >60 mL/min   Anion gap 7 5 - 15  Lipase, blood  Result Value Ref Range   Lipase 20 11 - 51 U/L  Troponin I  Result Value Ref Range   Troponin I <0.03 <0.03 ng/mL  CBC with Differential  Result Value Ref Range   WBC 6.8 4.0 - 10.5 K/uL   RBC 4.59 3.87 - 5.11 MIL/uL   Hemoglobin 13.8 12.0 - 15.0 g/dL   HCT 40.8 36.0 - 46.0 %   MCV 88.9 78.0 - 100.0 fL   MCH 30.1 26.0 - 34.0 pg   MCHC 33.8 30.0 - 36.0 g/dL   RDW 12.8 11.5 - 15.5 %   Platelets 250 150 - 400 K/uL   Neutrophils Relative % 63 %   Neutro Abs 4.3 1.7 - 7.7 K/uL   Lymphocytes Relative 27 %   Lymphs Abs 1.8 0.7 - 4.0 K/uL   Monocytes Relative 8 %   Monocytes Absolute 0.5 0.1 - 1.0 K/uL   Eosinophils Relative 2 %   Eosinophils Absolute 0.1 0.0 - 0.7 K/uL   Basophils Relative 0 %   Basophils Absolute 0.0 0.0 - 0.1 K/uL  Protime-INR  Result Value Ref Range   Prothrombin Time 19.8 (H) 11.4 - 15.2 seconds   INR 1.70   I-Stat CG4 Lactic Acid, ED  Result Value Ref Range   Lactic Acid, Venous 1.49 0.5 - 1.9 mmol/L   Dg Abd Acute W/chest Result Date: 06/27/2017 CLINICAL DATA:  Epigastric pain and weakness. EXAM: DG ABDOMEN ACUTE W/ 1V CHEST COMPARISON:  CT scan 03/28/2017. FINDINGS: The lungs are clear without focal pneumonia, edema, pneumothorax or pleural effusion. Interstitial markings are diffusely coarsened with chronic features. The cardio pericardial silhouette is  enlarged. Bones are diffusely demineralized. Telemetry leads overlie the chest. Upright film shows no evidence for intraperitoneal free air. Supine views of the abdomen and pelvis show no gaseous small bowel dilatation to suggest obstruction. Air and stool or seen scattered along the length of the nondilated colon. Thoracolumbar scoliosis evident. Bones diffusely demineralized. IMPRESSION: 1. No acute cardiopulmonary findings. 2. No evidence for bowel obstruction or perforation. Electronically Signed   By: Misty Stanley M.D.   On: 06/27/2017 12:40    1325:  Pt initially orthostatic on VS. Judicious IVF given with improvement in BP. Workup reassuring. Pt has tol PO well while in the ED without N/V. Pt has stood at the bedside with walker and assist  per baseline (per Epic chart review). No clear indication for admission at this time. Dx and testing d/w pt.  Questions answered.  Verb understanding, agreeable to d/c back to NH with outpt f/u.    Final Clinical Impressions(s) / ED Diagnoses   Final diagnoses:  None    ED Discharge Orders    None       Francine Graven, DO 07/02/17 1637

## 2017-06-27 NOTE — ED Triage Notes (Addendum)
Pt sent over by Dr. Legrand Rams for hypotension. Pt was in office for therapy. Stated that her BP was 80/40 while sitting, and 90/56 while lying. Pt is a resident of [facility].

## 2017-06-27 NOTE — ED Notes (Signed)
Pt drinking water without difficulty.

## 2017-06-28 ENCOUNTER — Ambulatory Visit: Payer: Medicare Other | Admitting: "Endocrinology

## 2017-06-28 LAB — URINE CULTURE: Culture: NO GROWTH

## 2017-06-29 DIAGNOSIS — R259 Unspecified abnormal involuntary movements: Secondary | ICD-10-CM | POA: Diagnosis not present

## 2017-06-29 DIAGNOSIS — E1165 Type 2 diabetes mellitus with hyperglycemia: Secondary | ICD-10-CM | POA: Diagnosis not present

## 2017-06-29 DIAGNOSIS — I951 Orthostatic hypotension: Secondary | ICD-10-CM | POA: Diagnosis not present

## 2017-06-29 DIAGNOSIS — G8190 Hemiplegia, unspecified affecting unspecified side: Secondary | ICD-10-CM | POA: Diagnosis not present

## 2017-06-29 DIAGNOSIS — I69354 Hemiplegia and hemiparesis following cerebral infarction affecting left non-dominant side: Secondary | ICD-10-CM | POA: Diagnosis not present

## 2017-06-29 DIAGNOSIS — F419 Anxiety disorder, unspecified: Secondary | ICD-10-CM | POA: Diagnosis not present

## 2017-06-29 DIAGNOSIS — F209 Schizophrenia, unspecified: Secondary | ICD-10-CM | POA: Diagnosis not present

## 2017-06-29 DIAGNOSIS — E119 Type 2 diabetes mellitus without complications: Secondary | ICD-10-CM | POA: Diagnosis not present

## 2017-06-29 DIAGNOSIS — Z794 Long term (current) use of insulin: Secondary | ICD-10-CM | POA: Diagnosis not present

## 2017-06-29 DIAGNOSIS — F333 Major depressive disorder, recurrent, severe with psychotic symptoms: Secondary | ICD-10-CM | POA: Diagnosis not present

## 2017-07-03 DIAGNOSIS — Z794 Long term (current) use of insulin: Secondary | ICD-10-CM | POA: Diagnosis not present

## 2017-07-03 DIAGNOSIS — F209 Schizophrenia, unspecified: Secondary | ICD-10-CM | POA: Diagnosis not present

## 2017-07-03 DIAGNOSIS — E119 Type 2 diabetes mellitus without complications: Secondary | ICD-10-CM | POA: Diagnosis not present

## 2017-07-03 DIAGNOSIS — F419 Anxiety disorder, unspecified: Secondary | ICD-10-CM | POA: Diagnosis not present

## 2017-07-03 DIAGNOSIS — R259 Unspecified abnormal involuntary movements: Secondary | ICD-10-CM | POA: Diagnosis not present

## 2017-07-03 DIAGNOSIS — I69354 Hemiplegia and hemiparesis following cerebral infarction affecting left non-dominant side: Secondary | ICD-10-CM | POA: Diagnosis not present

## 2017-07-04 DIAGNOSIS — R259 Unspecified abnormal involuntary movements: Secondary | ICD-10-CM | POA: Diagnosis not present

## 2017-07-04 DIAGNOSIS — F209 Schizophrenia, unspecified: Secondary | ICD-10-CM | POA: Diagnosis not present

## 2017-07-04 DIAGNOSIS — F419 Anxiety disorder, unspecified: Secondary | ICD-10-CM | POA: Diagnosis not present

## 2017-07-04 DIAGNOSIS — I69354 Hemiplegia and hemiparesis following cerebral infarction affecting left non-dominant side: Secondary | ICD-10-CM | POA: Diagnosis not present

## 2017-07-04 DIAGNOSIS — E119 Type 2 diabetes mellitus without complications: Secondary | ICD-10-CM | POA: Diagnosis not present

## 2017-07-04 DIAGNOSIS — Z794 Long term (current) use of insulin: Secondary | ICD-10-CM | POA: Diagnosis not present

## 2017-07-05 DIAGNOSIS — H5201 Hypermetropia, right eye: Secondary | ICD-10-CM | POA: Diagnosis not present

## 2017-07-05 DIAGNOSIS — I69354 Hemiplegia and hemiparesis following cerebral infarction affecting left non-dominant side: Secondary | ICD-10-CM | POA: Diagnosis not present

## 2017-07-05 DIAGNOSIS — H524 Presbyopia: Secondary | ICD-10-CM | POA: Diagnosis not present

## 2017-07-05 DIAGNOSIS — H52223 Regular astigmatism, bilateral: Secondary | ICD-10-CM | POA: Diagnosis not present

## 2017-07-05 NOTE — Progress Notes (Signed)
Cardiology Office Note  Date: 07/06/2017   ID: [name], DOB 14-Feb-1945, MRN [id number]  PCP: [name], MD  Primary Cardiologist: Rozann Lesches, MD   Chief Complaint  Patient presents with  . Chronic anticoagulation    History of Present Illness: [name] is a 72 y.o. female last seen in March 2018.  Interval records reviewed.  She was hospitalized in March with generalized weakness and orthostasis, complicated by diarrheal illness.  She was also seen recently in the ER with hypotension that improved with fluids.  She continues to reside at [facility].  She is here with an Environmental consultant today.  She does not provide very much history, denies any recent complaints or concerns.  I reviewed her medication list.  She is still following with Dr. Legrand Rams.  She follows in the anticoagulation clinic on Coumadin with a previous history of DVT and stroke.  Past Medical History:  Diagnosis Date  . Anxiety   . Arthritis   . Dependence on wheelchair    pivot/transfers  . Depression    History of psychosis and previous suicide attempt  . DVT, lower extremity, recurrent (HCC)    Long-term Coumadin per Dr. Legrand Rams  . Essential hypertension   . GERD (gastroesophageal reflux disease)   . Hemiplegia ([place]) 2010   Left side  . History of stroke    Acute infarct and right cerebral white matter small vessel disease 12/10  . Leg DVT (deep venous thromboembolism), acute ([place]) 2006  . Schizophrenia ([place])   . Stroke [facility])    left sided weakness  . Type 2 diabetes mellitus ([place])     Past Surgical History:  Procedure Laterality Date  . BACK SURGERY    . BIOPSY N/A 11/24/2014   Procedure: BIOPSY;  Surgeon: Danie Binder, MD;  Location: [facility] ORS;  Service: Endoscopy;  Laterality: N/A;  . BIOPSY  05/17/2016   Procedure: BIOPSY;  Surgeon: Danie Binder, MD;  Location: [facility] ENDO SUITE;  Service: Endoscopy;;  gastric biopsy  . COLONOSCOPY WITH PROPOFOL N/A 11/24/2014   Dr. Rudie Meyer polyps  removed/moderate sized internal hemorrhoids, tubular adenomas. Next surveillance in 3 years  . ESOPHAGOGASTRODUODENOSCOPY (EGD) WITH PROPOFOL N/A 11/24/2014   Dr. Clayburn Pert HH/patent stricture at the gastroesophageal junction, mild non-erosive gastritis, path negative for H.pylori or celiac sprue  . ESOPHAGOGASTRODUODENOSCOPY (EGD) WITH PROPOFOL N/A 05/17/2016   Procedure: ESOPHAGOGASTRODUODENOSCOPY (EGD) WITH PROPOFOL;  Surgeon: Danie Binder, MD;  Location: [facility] END[facility] SU[facility];  Service: Endoscopy;  Laterality: N/A;  12:45pm  . GIVENS CAPSULE STUDY N/A 12/11/2014   MULTILPLE EROSION IN the stomach WITH ACTIVE OOZING. OCCASIONAL EROSIONS AND RARE ULCER SEEN IN PROXIMAL SMALL BOWEL . No masses or AVMs SEEN. NO OLD BLOOD OR FRESH BLOOD SEEN.   . POLYPECTOMY N/A 11/24/2014   Procedure: POLYPECTOMY;  Surgeon: Danie Binder, MD;  Location: [facility] ORS;  Service: Endoscopy;  Laterality: N/A;  . SAVORY DILATION N/A 05/17/2016   Procedure: SAVORY DILATION;  Surgeon: Danie Binder, MD;  Location: [facility] ENDO SUITE;  Service: Endoscopy;  Laterality: N/A;    Current Outpatient Medications  Medication Sig Dispense Refill  . acetaminophen (TYLENOL) 325 MG tablet Take 650 mg by mouth every 6 (six) hours as needed. For fever > 101     . AMITIZA 8 MCG capsule Take 8 mcg by mouth daily with breakfast.     . ARIPiprazole (ABILIFY) 2 MG tablet Take 2 mg by mouth daily.     . calcium  carbonate (TUMS - DOSED IN MG ELEMENTAL CALCIUM) 500 MG chewable tablet Chew 1 tablet by mouth 4 (four) times daily as needed for indigestion or heartburn.     . cholecalciferol (VITAMIN D) 1000 units tablet Take 2,000 Units by mouth daily.    . clonazePAM (KLONOPIN) 0.5 MG tablet Take 1 tablet (0.5 mg total) by mouth 4 (four) times daily. 30 tablet 0  . docusate sodium (COLACE) 100 MG capsule Take 100 mg by mouth 2 (two) times daily.    . DULoxetine (CYMBALTA) 60 MG capsule Take 120 mg by mouth daily.     Marland Kitchen ENULOSE 10 GM/15ML SOLN Take 20 g by  mouth 2 (two) times daily as needed.     Marland Kitchen HYDROcodone-acetaminophen (NORCO/VICODIN) 5-325 MG tablet Take 1 tablet by mouth 3 (three) times daily. 30 tablet 0  . hydrocortisone (ANUSOL-HC) 2.5 % rectal cream Place 1 application rectally 2 (two) times daily.    . Insulin Aspart (NOVOLOG FLEXPEN [place]) Inject 15-21 Units into the skin 3 (three) times daily before meals.    . Insulin Detemir (LEVEMIR FLEXTOUCH) 100 UNIT/ML Pen Inject 30 Units into the skin daily at 10 pm. 15 mL 3  . iron polysaccharides (NIFEREX) 150 MG capsule Take 1 capsule (150 mg total) by mouth daily. 30 capsule 11  . latanoprost (XALATAN) 0.005 % ophthalmic solution Place 1 drop into both eyes at bedtime.    Marland Kitchen LEVEMIR FLEXTOUCH 100 UNIT/ML Pen INJECT 50 UNITS SUBCUTANEOUSLY AT BEDTIME. 15 mL 3  . loperamide (IMODIUM) 2 MG capsule Take 4 mg by mouth daily as needed for diarrhea or loose stools. Take 2 capsules initially then take 2 after each loose stool per MAR    . metoprolol succinate (TOPROL-XL) 25 MG 24 hr tablet Take 0.5 tablets (12.5 mg total) by mouth daily.    . midodrine (PROAMATINE) 2.5 MG tablet Take 2.5 mg by mouth 3 (three) times daily with meals.     . mirtazapine (REMERON) 30 MG tablet Take 30 mg by mouth at bedtime.      . naloxegol oxalate (MOVANTIK) 12.5 MG TABS tablet Take 12.5 mg by mouth daily.    Marland Kitchen NOVOLOG FLEXPEN 100 UNIT/ML FlexPen INJECT 15 UNITS SUBCUTANEOUSLY BEFORE MEALS IF BS IS BETWEEN 90-150. 15 mL 3  . omeprazole (PRILOSEC) 40 MG capsule Take 40 mg by mouth daily.    . ondansetron (ZOFRAN) 4 MG tablet Take 1 tablet (4 mg total) by mouth 4 (four) times daily -  before meals and at bedtime. 120 tablet 3  . polyethylene glycol powder (GLYCOLAX/MIRALAX) powder Take 17 g by mouth daily. 255 g 0  . polyvinyl alcohol (ARTIFICIAL TEARS) 1.4 % ophthalmic solution Place 1 drop into both eyes 3 (three) times daily.    . risperiDONE (RISPERDAL) 3 MG tablet Take 3 mg by mouth 2 (two) times daily.     .  simvastatin (ZOCOR) 20 MG tablet Take 20 mg by mouth at bedtime.     . solifenacin (VESICARE) 5 MG tablet Take 5 mg by mouth daily.    Marland Kitchen tiZANidine (ZANAFLEX) 2 MG tablet Take 2 mg by mouth daily.    Marland Kitchen warfarin (COUMADIN) 5 MG tablet TAKE 1/2 TABLET (2.5mg ) BY MOUTH ONCE DAILY ON MON,WED,FRI & SATURDAY. TAKE 1 TABLET (5mg ) ON SUNDAY, TUES,& THURS. 20 tablet 3  . zolpidem (AMBIEN) 10 MG tablet Take 1 tablet (10 mg total) by mouth at bedtime. 5 tablet 0   No current facility-administered medications for this visit.  Allergies:  Sulfa antibiotics and Sulfonamide derivatives   Social History: The patient  reports that she quit smoking about 22 years ago. Her smoking use included cigarettes. She has a 5.00 pack-year smoking history. She has never used smokeless tobacco. She reports that she does not drink alcohol or use drugs.   ROS:  Please see the history of present illness. Otherwise, complete review of systems is positive for none.  All other systems are reviewed and negative.   Physical Exam: VS:  BP 102/64   Pulse 88   Ht 5\' 5"  (1.651 m)   Wt 151 lb (68.5 kg)   SpO2 96%   BMI 25.13 kg/m , BMI Body mass index is 25.13 kg/m.  Wt Readings from Last 3 Encounters:  07/06/17 151 lb (68.5 kg)  06/27/17 138 lb (62.6 kg)  03/28/17 154 lb 5.2 oz (70 kg)    General: Patient in wheelchair, appears comfortable at rest. HEENT: Conjunctiva and lids normal, oropharynx clear. Neck: Supple, no elevated JVP or carotid bruits, no thyromegaly. Lungs: Clear to auscultation, nonlabored breathing at rest. Cardiac: Regular rate and rhythm, no S3, 2/6 systolic murmur. Abdomen: Soft, nontender, bowel sounds present. Extremities: Mild ankle edema, distal pulses 2+. Skin: Warm and dry.  ECG: I personally reviewed the tracing from 06/28/2017 which showed sinus rhythm with inferolateral infarct pattern, ST segment changes are chronic based on review of serial tracings.  Recent Labwork: 03/29/2017: TSH  1.138 06/27/2017: ALT 11; AST 15; BUN 11; Creatinine, Ser 0.74; Hemoglobin 13.8; Platelets 250; Potassium 3.7; Sodium 138     Component Value Date/Time   CHOL 133 10/07/2016 0620   TRIG 252 (H) 10/07/2016 0620   HDL 28 (L) 10/07/2016 0620   CHOLHDL 4.8 10/07/2016 0620   VLDL 50 (H) 10/07/2016 0620   LDLCALC 55 10/07/2016 0620    Other Studies Reviewed Today:  Echocardiogram 10/06/2016: Study Conclusions  - Left ventricle: The cavity size was normal. Systolic function was   vigorous. The estimated ejection fraction was in the range of 65%   to 70%. Wall motion was normal; there were no regional wall   motion abnormalities. Doppler parameters are consistent with   abnormal left ventricular relaxation (grade 1 diastolic   dysfunction). Doppler parameters are consistent with high   ventricular filling pressure. Moderate concentric and severe   focal basal septal hypertrophy. - Aortic valve: Moderately calcified annulus. Trileaflet. - Mitral valve: Calcified annulus.  Assessment and Plan:  1.  History of DVT and stroke, on chronic Coumadin per Dr. Legrand Rams, followed in our anticoagulation clinic.  No obvious spontaneous bleeding problems.  2.  History of SVT, asymptomatic at this time.  Continues on low-dose Toprol-XL.  3.  Orthostatic hypotension, on Midodrine.  Current medicines were reviewed with the patient today.   Disposition: Follow-up in 1 year.  Signed, Satira Sark, MD, [facility] 07/06/2017 9:59 AM    [facility] at [facility] [address], [place], [place] [address] Phone: [phone]; Fax: [phone]

## 2017-07-06 ENCOUNTER — Ambulatory Visit (INDEPENDENT_AMBULATORY_CARE_PROVIDER_SITE_OTHER): Payer: Medicare Other | Admitting: Cardiology

## 2017-07-06 ENCOUNTER — Encounter: Payer: Self-pay | Admitting: Cardiology

## 2017-07-06 VITALS — BP 102/64 | HR 88 | Ht 65.0 in | Wt 151.0 lb

## 2017-07-06 DIAGNOSIS — Z7901 Long term (current) use of anticoagulants: Secondary | ICD-10-CM | POA: Diagnosis not present

## 2017-07-06 DIAGNOSIS — Z86718 Personal history of other venous thrombosis and embolism: Secondary | ICD-10-CM

## 2017-07-06 DIAGNOSIS — Z8673 Personal history of transient ischemic attack (TIA), and cerebral infarction without residual deficits: Secondary | ICD-10-CM | POA: Diagnosis not present

## 2017-07-06 DIAGNOSIS — I471 Supraventricular tachycardia: Secondary | ICD-10-CM

## 2017-07-06 NOTE — Patient Instructions (Signed)
Your physician wants you to follow-up in: 1 year with Dr.McDowell You will receive a reminder letter in the mail two months in advance. If you don't receive a letter, please call our office to schedule the follow-up appointment.    Your physician recommends that you continue on your current medications as directed. Please refer to the Current Medication list given to you today.    If you need a refill on your cardiac medications before your next appointment, please call your pharmacy.     No lab work or tests ordered today.      Thank you for choosing [facility] !        

## 2017-07-11 DIAGNOSIS — Z794 Long term (current) use of insulin: Secondary | ICD-10-CM | POA: Diagnosis not present

## 2017-07-11 DIAGNOSIS — F209 Schizophrenia, unspecified: Secondary | ICD-10-CM | POA: Diagnosis not present

## 2017-07-11 DIAGNOSIS — F419 Anxiety disorder, unspecified: Secondary | ICD-10-CM | POA: Diagnosis not present

## 2017-07-11 DIAGNOSIS — E119 Type 2 diabetes mellitus without complications: Secondary | ICD-10-CM | POA: Diagnosis not present

## 2017-07-11 DIAGNOSIS — I69354 Hemiplegia and hemiparesis following cerebral infarction affecting left non-dominant side: Secondary | ICD-10-CM | POA: Diagnosis not present

## 2017-07-11 DIAGNOSIS — R259 Unspecified abnormal involuntary movements: Secondary | ICD-10-CM | POA: Diagnosis not present

## 2017-07-17 DIAGNOSIS — E119 Type 2 diabetes mellitus without complications: Secondary | ICD-10-CM | POA: Diagnosis not present

## 2017-07-17 DIAGNOSIS — M79675 Pain in left toe(s): Secondary | ICD-10-CM | POA: Diagnosis not present

## 2017-07-17 DIAGNOSIS — M79674 Pain in right toe(s): Secondary | ICD-10-CM | POA: Diagnosis not present

## 2017-07-17 DIAGNOSIS — F419 Anxiety disorder, unspecified: Secondary | ICD-10-CM | POA: Diagnosis not present

## 2017-07-17 DIAGNOSIS — I69354 Hemiplegia and hemiparesis following cerebral infarction affecting left non-dominant side: Secondary | ICD-10-CM | POA: Diagnosis not present

## 2017-07-17 DIAGNOSIS — Z794 Long term (current) use of insulin: Secondary | ICD-10-CM | POA: Diagnosis not present

## 2017-07-17 DIAGNOSIS — B351 Tinea unguium: Secondary | ICD-10-CM | POA: Diagnosis not present

## 2017-07-17 DIAGNOSIS — R259 Unspecified abnormal involuntary movements: Secondary | ICD-10-CM | POA: Diagnosis not present

## 2017-07-17 DIAGNOSIS — F209 Schizophrenia, unspecified: Secondary | ICD-10-CM | POA: Diagnosis not present

## 2017-07-18 ENCOUNTER — Ambulatory Visit (INDEPENDENT_AMBULATORY_CARE_PROVIDER_SITE_OTHER): Payer: Medicare Other | Admitting: *Deleted

## 2017-07-18 ENCOUNTER — Telehealth: Payer: Self-pay | Admitting: *Deleted

## 2017-07-18 DIAGNOSIS — Z5181 Encounter for therapeutic drug level monitoring: Secondary | ICD-10-CM

## 2017-07-18 DIAGNOSIS — I82409 Acute embolism and thrombosis of unspecified deep veins of unspecified lower extremity: Secondary | ICD-10-CM

## 2017-07-18 DIAGNOSIS — Z8679 Personal history of other diseases of the circulatory system: Secondary | ICD-10-CM | POA: Diagnosis not present

## 2017-07-18 LAB — POCT INR: INR: 1.7 — AB (ref 2.0–3.0)

## 2017-07-18 MED ORDER — WARFARIN SODIUM 5 MG PO TABS: ORAL_TABLET | ORAL | 3 refills | Status: DC

## 2017-07-18 NOTE — Patient Instructions (Signed)
Take 1 tablet tonight then increase dose to 5mg  daily except 2.5mg  on Mondays, Wednesdays and Fridays Recheck in 3 weeks

## 2017-07-18 NOTE — Telephone Encounter (Signed)
New coumadin order sent to [facility]

## 2017-07-18 NOTE — Telephone Encounter (Signed)
Needs new RX for Warfarin for new dose changed to today. Please call RX care with questions / tg

## 2017-07-19 DIAGNOSIS — E119 Type 2 diabetes mellitus without complications: Secondary | ICD-10-CM | POA: Diagnosis not present

## 2017-07-19 DIAGNOSIS — F209 Schizophrenia, unspecified: Secondary | ICD-10-CM | POA: Diagnosis not present

## 2017-07-19 DIAGNOSIS — R259 Unspecified abnormal involuntary movements: Secondary | ICD-10-CM | POA: Diagnosis not present

## 2017-07-19 DIAGNOSIS — I69354 Hemiplegia and hemiparesis following cerebral infarction affecting left non-dominant side: Secondary | ICD-10-CM | POA: Diagnosis not present

## 2017-07-19 DIAGNOSIS — F419 Anxiety disorder, unspecified: Secondary | ICD-10-CM | POA: Diagnosis not present

## 2017-07-19 DIAGNOSIS — Z794 Long term (current) use of insulin: Secondary | ICD-10-CM | POA: Diagnosis not present

## 2017-07-20 ENCOUNTER — Ambulatory Visit (INDEPENDENT_AMBULATORY_CARE_PROVIDER_SITE_OTHER): Payer: Medicare Other | Admitting: Nurse Practitioner

## 2017-07-20 ENCOUNTER — Encounter: Payer: Self-pay | Admitting: Nurse Practitioner

## 2017-07-20 VITALS — BP 106/58 | HR 87 | Temp 97.0°F | Ht 65.0 in

## 2017-07-20 DIAGNOSIS — K582 Mixed irritable bowel syndrome: Secondary | ICD-10-CM | POA: Diagnosis not present

## 2017-07-20 DIAGNOSIS — R112 Nausea with vomiting, unspecified: Secondary | ICD-10-CM

## 2017-07-20 DIAGNOSIS — R197 Diarrhea, unspecified: Secondary | ICD-10-CM | POA: Diagnosis not present

## 2017-07-20 NOTE — Assessment & Plan Note (Signed)
Intermittent diarrhea.  Previously Imodium worked well.  I do not see Imodium on her list.  I will add this back and is a recommendation for as needed dosing for observed diarrhea.  Follow-up in 6 months.

## 2017-07-20 NOTE — Progress Notes (Signed)
Referring Provider: [name], MD Primary Care Physician:  [name], MD Primary GI:  Dr. Oneida Alar  Chief Complaint  Patient presents with  . Irritable Bowel Syndrome    has diarrhea episodes  . Rectal Bleeding    reports not currently an issue  . Nausea    no vomiting  . Abdominal Pain    comes/goes    HPI:   [name] is a 72 y.o. female who presents for follow-up on IBS and rectal bleeding.  Patient was last seen in our office 10/16/2016 for non-intractable nausea and vomiting, rectal bleeding, IBS with diarrhea.  Noted history of IDA.  Colonoscopy/EGD/capsule study completed November 2016 and felt to be related to erosive gastropathy and enteropathy and next due for colonoscopy in 2019.  Abnormal CT of the abdomen February 2018 with follow-up MRI demonstrating right lobe liver hemangioma.  Repeat EGD 05/17/2016 which found benign-appearing esophageal stenosis, small hiatal hernia, gastric polyps, chronic gastritis.  Pathology found chronic gastritis negative for H. Pylori.  At her last visit she was accompanied by facility staff and is doing well overall.  Rare nausea.  States she has constipation but facility notes indicate diarrhea.  Staff said she alternates between diarrhea and constipation.  Stool softener tends to help.  Diarrhea about every 2 weeks was last for several days.  Abdominal pain which improves after bowel movement somewhat.  No other GI symptoms.  Imodium helps her diarrhea.  Friend helps nausea after taking iron.  Amended continue current medications.  Take Movantik with constipation and Imodium with diarrhea.  Follow-up in 6 months.  Follow-up by facility indicates alternating stories.  They tend to doze her based on what they observe, constipation versus diarrhea.  Today she is accompanied by facility staff.  Today she states he has a "nervous stomach."  Some morning time nausea, but no vomiting.  Constipation doing well.  Occasionally has diarrhea.   Denies abdominal pain, vomiting, hematochezia, melena, fever, chills.  She declines colonoscopy.  Denies chest pain, dyspnea, dizziness, lightheadedness, syncope, near syncope. Denies any other upper or lower GI symptoms.  Past Medical History:  Diagnosis Date  . Anxiety   . Arthritis   . Dependence on wheelchair    pivot/transfers  . Depression    History of psychosis and previous suicide attempt  . DVT, lower extremity, recurrent (HCC)    Long-term Coumadin per Dr. Legrand Rams  . Essential hypertension   . GERD (gastroesophageal reflux disease)   . Hemiplegia ([place]) 2010   Left side  . History of stroke    Acute infarct and right cerebral white matter small vessel disease 12/10  . Leg DVT (deep venous thromboembolism), acute ([place]) 2006  . Schizophrenia ([place])   . Stroke [facility])    left sided weakness  . Type 2 diabetes mellitus ([place])     Past Surgical History:  Procedure Laterality Date  . BACK SURGERY    . BIOPSY N/A 11/24/2014   Procedure: BIOPSY;  Surgeon: Danie Binder, MD;  Location: [facility] ORS;  Service: Endoscopy;  Laterality: N/A;  . BIOPSY  05/17/2016   Procedure: BIOPSY;  Surgeon: Danie Binder, MD;  Location[facility];  Service: Endoscopy;;  gastric biopsy  . COLONOSCOPY WITH PROPOFOL N/A 11/24/2014   Dr. Rudie Meyer polyps removed/moderate sized internal hemorrhoids, tubular adenomas. Next surveillance in 3 years  . ESOPHAGOGASTRODUODENOSCOPY (EGD) WITH PROPOFOL N/A 11/24/2014   Dr. Clayburn Pert HH/patent stricture at the gastroesophageal junction, mild non-erosive gastritis, path negative  for H.pylori or celiac sprue  . ESOPHAGOGASTRODUODENOSCOPY (EGD) WITH PROPOFOL N/A 05/17/2016   Procedure: ESOPHAGOGASTRODUODENOSCOPY (EGD) WITH PROPOFOL;  Surgeon: Danie Binder, MD;  Location: [facility];  Service: Endoscopy;  Laterality: N/A;  12:45pm  . GIVENS CAPSULE STUDY N/A 12/11/2014   MULTILPLE EROSION IN the stomach WITH ACTIVE OOZING. OCCASIONAL EROSIONS AND RARE ULCER SEEN IN  PROXIMAL SMALL BOWEL . No masses or AVMs SEEN. NO OLD BLOOD OR FRESH BLOOD SEEN.   . POLYPECTOMY N/A 11/24/2014   Procedure: POLYPECTOMY;  Surgeon: Danie Binder, MD;  Location: [facility] ORS;  Service: Endoscopy;  Laterality: N/A;  . SAVORY DILATION N/A 05/17/2016   Procedure: SAVORY DILATION;  Surgeon: Danie Binder, MD;  Location: [facility];  Service: Endoscopy;  Laterality: N/A;    Current Outpatient Medications  Medication Sig Dispense Refill  . AMITIZA 8 MCG capsule Take 8 mcg by mouth daily with breakfast.     . ARIPiprazole (ABILIFY) 2 MG tablet Take 2 mg by mouth daily.     . calcium carbonate (TUMS - DOSED IN MG ELEMENTAL CALCIUM) 500 MG chewable tablet Chew 1 tablet by mouth 4 (four) times daily as needed for indigestion or heartburn.     . cholecalciferol (VITAMIN D) 1000 units tablet Take 2,000 Units by mouth daily.    . clonazePAM (KLONOPIN) 0.5 MG tablet Take 1 tablet (0.5 mg total) by mouth 4 (four) times daily. 30 tablet 0  . docusate sodium (COLACE) 100 MG capsule Take 200 mg by mouth at bedtime.     . DULoxetine (CYMBALTA) 60 MG capsule Take 120 mg by mouth daily.     Marland Kitchen ENULOSE 10 GM/15ML SOLN Take 20 g by mouth 2 (two) times daily as needed.     Marland Kitchen HYDROcodone-acetaminophen (NORCO/VICODIN) 5-325 MG tablet Take 1 tablet by mouth 3 (three) times daily. 30 tablet 0  . hydrocortisone (ANUSOL-HC) 2.5 % rectal cream Place 1 application rectally 2 (two) times daily.    . Insulin Aspart (NOVOLOG FLEXPEN [place]) Inject 15-21 Units into the skin 3 (three) times daily before meals.    . iron polysaccharides (NIFEREX) 150 MG capsule Take 1 capsule (150 mg total) by mouth daily. 30 capsule 11  . latanoprost (XALATAN) 0.005 % ophthalmic solution Place 1 drop into both eyes at bedtime.    Marland Kitchen loperamide (IMODIUM) 2 MG capsule Take 4 mg by mouth daily as needed for diarrhea or loose stools. Take 2 capsules initially then take 2 after each loose stool per MAR    . metoprolol succinate (TOPROL-XL) 25  MG 24 hr tablet Take 0.5 tablets (12.5 mg total) by mouth daily.    . midodrine (PROAMATINE) 2.5 MG tablet Take 2.5 mg by mouth 3 (three) times daily with meals.     . mirtazapine (REMERON) 30 MG tablet Take 30 mg by mouth at bedtime.      . naloxegol oxalate (MOVANTIK) 12.5 MG TABS tablet Take 12.5 mg by mouth daily.    Marland Kitchen omeprazole (PRILOSEC) 40 MG capsule Take 40 mg by mouth daily.    . ondansetron (ZOFRAN) 4 MG tablet Take 1 tablet (4 mg total) by mouth 4 (four) times daily -  before meals and at bedtime. 120 tablet 3  . polyethylene glycol powder (GLYCOLAX/MIRALAX) powder Take 17 g by mouth daily. 255 g 0  . polyvinyl alcohol (ARTIFICIAL TEARS) 1.4 % ophthalmic solution Place 1 drop into both eyes 3 (three) times daily.    . risperiDONE (RISPERDAL) 3 MG  tablet Take 3 mg by mouth 2 (two) times daily.     . simvastatin (ZOCOR) 20 MG tablet Take 20 mg by mouth at bedtime.     . solifenacin (VESICARE) 5 MG tablet Take 5 mg by mouth daily.    Marland Kitchen tiZANidine (ZANAFLEX) 2 MG tablet Take 2 mg by mouth daily.    Marland Kitchen warfarin (COUMADIN) 5 MG tablet Take coumadin 5mg  tonight then increase dose to 5mg  daily except 2.5mg  on Mondays, Wednesdays and Fridays 24 tablet 3  . zolpidem (AMBIEN) 10 MG tablet Take 1 tablet (10 mg total) by mouth at bedtime. 5 tablet 0   No current facility-administered medications for this visit.     Allergies as of 07/20/2017 - Review Complete 07/20/2017  Allergen Reaction Noted  . Sulfa antibiotics Rash 02/23/2010  . Sulfonamide derivatives Rash     Family History  Problem Relation Age of Onset  . Hypertension Mother   . Colon cancer Neg Hx     Social History   Socioeconomic History  . Marital status: Widowed    Spouse name: Not on file  . Number of children: Not on file  . Years of education: Not on file  . Highest education level: Not on file  Occupational History  . Not on file  Social Needs  . Financial resource strain: Not on file  . Food insecurity:     Worry: Not on file    Inability: Not on file  . Transportation needs:    Medical: Not on file    Non-medical: Not on file  Tobacco Use  . Smoking status: Former Smoker    Packs/day: 0.25    Years: 20.00    Pack years: 5.00    Types: Cigarettes    Last attempt to quit: 01/24/1995    Years since quitting: 22.5  . Smokeless tobacco: Never Used  Substance and Sexual Activity  . Alcohol use: No    Alcohol/week: 0.0 oz  . Drug use: No  . Sexual activity: Never    Birth control/protection: Post-menopausal  Lifestyle  . Physical activity:    Days per week: Not on file    Minutes per session: Not on file  . Stress: Not on file  Relationships  . Social connections:    Talks on phone: Not on file    Gets together: Not on file    Attends religious service: Not on file    Active member of club or organization: Not on file    Attends meetings of clubs or organizations: Not on file    Relationship status: Not on file  Other Topics Concern  . Not on file  Social History Narrative  . Not on file    Review of Systems: Complete ROS negative except as per HPI.   Physical Exam: BP (!) 106/58   Pulse 87   Temp (!) 97 F (36.1 C) (Oral)   Ht 5\' 5"  (1.651 m)   BMI 25.13 kg/m  General:   Alert and oriented. Pleasant and cooperative. Well-nourished and well-developed. In wheelchair. Eyes:  Without icterus, sclera clear and conjunctiva pink.  Ears:  Normal auditory acuity. Cardiovascular:  S1, S2 present without murmurs appreciated. Extremities without clubbing or edema. Respiratory:  Clear to auscultation bilaterally. No wheezes, rales, or rhonchi. No distress.  Gastrointestinal:  +BS, soft, non-tender and non-distended. No HSM noted. No guarding or rebound. No masses appreciated.  Rectal:  Deferred  Musculoskalatal:  Symmetrical without gross deformities. Neurologic:  Alert and oriented x4;  grossly normal neurologically. Psych:  Alert and cooperative. Normal mood and  affect. Heme/Lymph/Immune: No excessive bruising noted.    07/20/2017 11:31 AM   Disclaimer: This note was dictated with voice recognition software. Similar sounding words can inadvertently be transcribed and may not be corrected upon review.

## 2017-07-20 NOTE — Patient Instructions (Signed)
1. Continue your current medications. 2. I have written an order to add Imodium "as needed" for when you have diarrhea. 3. Follow-up in 6 months. 4. Call if you have any questions or concerns.

## 2017-07-20 NOTE — Assessment & Plan Note (Signed)
Generally well controlled.  She does have some morning time nausea.  I recommend continue Zofran as needed.  She continues to have morning time nausea we can schedule her morning time dose.  Follow-up in 6 months.

## 2017-07-20 NOTE — Assessment & Plan Note (Addendum)
IBS mixed type although tends to be primarily constipation.  She is on stool softeners and medications for her constipation.  She does occasionally flip to diarrhea for which Imodium works well.  Continue current medications.  Diarrhea management as per below.  Follow-up in 6 months.  As an aside I recommended colonoscopy as per previously recommended after her previous endoscopic evaluation.  She declines colonoscopy at this time.

## 2017-07-23 NOTE — Progress Notes (Signed)
cc'd to pcp 

## 2017-07-24 DIAGNOSIS — Z794 Long term (current) use of insulin: Secondary | ICD-10-CM | POA: Diagnosis not present

## 2017-07-24 DIAGNOSIS — R259 Unspecified abnormal involuntary movements: Secondary | ICD-10-CM | POA: Diagnosis not present

## 2017-07-24 DIAGNOSIS — E119 Type 2 diabetes mellitus without complications: Secondary | ICD-10-CM | POA: Diagnosis not present

## 2017-07-24 DIAGNOSIS — F209 Schizophrenia, unspecified: Secondary | ICD-10-CM | POA: Diagnosis not present

## 2017-07-24 DIAGNOSIS — F419 Anxiety disorder, unspecified: Secondary | ICD-10-CM | POA: Diagnosis not present

## 2017-07-24 DIAGNOSIS — I69354 Hemiplegia and hemiparesis following cerebral infarction affecting left non-dominant side: Secondary | ICD-10-CM | POA: Diagnosis not present

## 2017-07-27 DIAGNOSIS — E119 Type 2 diabetes mellitus without complications: Secondary | ICD-10-CM | POA: Diagnosis not present

## 2017-07-27 DIAGNOSIS — F209 Schizophrenia, unspecified: Secondary | ICD-10-CM | POA: Diagnosis not present

## 2017-07-27 DIAGNOSIS — Z794 Long term (current) use of insulin: Secondary | ICD-10-CM | POA: Diagnosis not present

## 2017-07-27 DIAGNOSIS — R259 Unspecified abnormal involuntary movements: Secondary | ICD-10-CM | POA: Diagnosis not present

## 2017-07-27 DIAGNOSIS — I69354 Hemiplegia and hemiparesis following cerebral infarction affecting left non-dominant side: Secondary | ICD-10-CM | POA: Diagnosis not present

## 2017-07-27 DIAGNOSIS — F419 Anxiety disorder, unspecified: Secondary | ICD-10-CM | POA: Diagnosis not present

## 2017-07-29 DIAGNOSIS — G8190 Hemiplegia, unspecified affecting unspecified side: Secondary | ICD-10-CM | POA: Diagnosis not present

## 2017-07-29 DIAGNOSIS — I10 Essential (primary) hypertension: Secondary | ICD-10-CM | POA: Diagnosis not present

## 2017-07-30 DIAGNOSIS — F419 Anxiety disorder, unspecified: Secondary | ICD-10-CM | POA: Diagnosis not present

## 2017-07-30 DIAGNOSIS — F209 Schizophrenia, unspecified: Secondary | ICD-10-CM | POA: Diagnosis not present

## 2017-07-30 DIAGNOSIS — E119 Type 2 diabetes mellitus without complications: Secondary | ICD-10-CM | POA: Diagnosis not present

## 2017-07-30 DIAGNOSIS — I69354 Hemiplegia and hemiparesis following cerebral infarction affecting left non-dominant side: Secondary | ICD-10-CM | POA: Diagnosis not present

## 2017-07-30 DIAGNOSIS — R259 Unspecified abnormal involuntary movements: Secondary | ICD-10-CM | POA: Diagnosis not present

## 2017-07-30 DIAGNOSIS — Z794 Long term (current) use of insulin: Secondary | ICD-10-CM | POA: Diagnosis not present

## 2017-08-02 DIAGNOSIS — F209 Schizophrenia, unspecified: Secondary | ICD-10-CM | POA: Diagnosis not present

## 2017-08-02 DIAGNOSIS — Z794 Long term (current) use of insulin: Secondary | ICD-10-CM | POA: Diagnosis not present

## 2017-08-02 DIAGNOSIS — F419 Anxiety disorder, unspecified: Secondary | ICD-10-CM | POA: Diagnosis not present

## 2017-08-02 DIAGNOSIS — I69354 Hemiplegia and hemiparesis following cerebral infarction affecting left non-dominant side: Secondary | ICD-10-CM | POA: Diagnosis not present

## 2017-08-02 DIAGNOSIS — E119 Type 2 diabetes mellitus without complications: Secondary | ICD-10-CM | POA: Diagnosis not present

## 2017-08-02 DIAGNOSIS — R259 Unspecified abnormal involuntary movements: Secondary | ICD-10-CM | POA: Diagnosis not present

## 2017-08-06 DIAGNOSIS — E119 Type 2 diabetes mellitus without complications: Secondary | ICD-10-CM | POA: Diagnosis not present

## 2017-08-06 DIAGNOSIS — Z794 Long term (current) use of insulin: Secondary | ICD-10-CM | POA: Diagnosis not present

## 2017-08-06 DIAGNOSIS — F209 Schizophrenia, unspecified: Secondary | ICD-10-CM | POA: Diagnosis not present

## 2017-08-06 DIAGNOSIS — R259 Unspecified abnormal involuntary movements: Secondary | ICD-10-CM | POA: Diagnosis not present

## 2017-08-06 DIAGNOSIS — I69354 Hemiplegia and hemiparesis following cerebral infarction affecting left non-dominant side: Secondary | ICD-10-CM | POA: Diagnosis not present

## 2017-08-06 DIAGNOSIS — F419 Anxiety disorder, unspecified: Secondary | ICD-10-CM | POA: Diagnosis not present

## 2017-08-10 ENCOUNTER — Ambulatory Visit (INDEPENDENT_AMBULATORY_CARE_PROVIDER_SITE_OTHER): Payer: Medicare Other | Admitting: *Deleted

## 2017-08-10 DIAGNOSIS — I69354 Hemiplegia and hemiparesis following cerebral infarction affecting left non-dominant side: Secondary | ICD-10-CM | POA: Diagnosis not present

## 2017-08-10 DIAGNOSIS — F209 Schizophrenia, unspecified: Secondary | ICD-10-CM | POA: Diagnosis not present

## 2017-08-10 DIAGNOSIS — Z8679 Personal history of other diseases of the circulatory system: Secondary | ICD-10-CM | POA: Diagnosis not present

## 2017-08-10 DIAGNOSIS — F419 Anxiety disorder, unspecified: Secondary | ICD-10-CM | POA: Diagnosis not present

## 2017-08-10 DIAGNOSIS — I82409 Acute embolism and thrombosis of unspecified deep veins of unspecified lower extremity: Secondary | ICD-10-CM | POA: Diagnosis not present

## 2017-08-10 DIAGNOSIS — Z794 Long term (current) use of insulin: Secondary | ICD-10-CM | POA: Diagnosis not present

## 2017-08-10 DIAGNOSIS — Z5181 Encounter for therapeutic drug level monitoring: Secondary | ICD-10-CM | POA: Diagnosis not present

## 2017-08-10 DIAGNOSIS — E119 Type 2 diabetes mellitus without complications: Secondary | ICD-10-CM | POA: Diagnosis not present

## 2017-08-10 DIAGNOSIS — I6930 Unspecified sequelae of cerebral infarction: Secondary | ICD-10-CM

## 2017-08-10 DIAGNOSIS — R259 Unspecified abnormal involuntary movements: Secondary | ICD-10-CM | POA: Diagnosis not present

## 2017-08-10 LAB — POCT INR: INR: 1.8 — AB (ref 2.0–3.0)

## 2017-08-10 NOTE — Patient Instructions (Signed)
Take coumadin 5mg  tonight then increase dose to 5mg  daily except 2.5mg  on Mondays and Fridays Recheck in 3 weeks

## 2017-08-16 DIAGNOSIS — M25569 Pain in unspecified knee: Secondary | ICD-10-CM | POA: Diagnosis not present

## 2017-08-16 DIAGNOSIS — M545 Low back pain: Secondary | ICD-10-CM | POA: Diagnosis not present

## 2017-08-16 DIAGNOSIS — I639 Cerebral infarction, unspecified: Secondary | ICD-10-CM | POA: Diagnosis not present

## 2017-08-17 DIAGNOSIS — M545 Low back pain: Secondary | ICD-10-CM | POA: Diagnosis not present

## 2017-08-17 DIAGNOSIS — M25569 Pain in unspecified knee: Secondary | ICD-10-CM | POA: Diagnosis not present

## 2017-08-17 DIAGNOSIS — I639 Cerebral infarction, unspecified: Secondary | ICD-10-CM | POA: Diagnosis not present

## 2017-08-20 DIAGNOSIS — I639 Cerebral infarction, unspecified: Secondary | ICD-10-CM | POA: Diagnosis not present

## 2017-08-20 DIAGNOSIS — M545 Low back pain: Secondary | ICD-10-CM | POA: Diagnosis not present

## 2017-08-20 DIAGNOSIS — M25569 Pain in unspecified knee: Secondary | ICD-10-CM | POA: Diagnosis not present

## 2017-08-22 DIAGNOSIS — I639 Cerebral infarction, unspecified: Secondary | ICD-10-CM | POA: Diagnosis not present

## 2017-08-22 DIAGNOSIS — M545 Low back pain: Secondary | ICD-10-CM | POA: Diagnosis not present

## 2017-08-22 DIAGNOSIS — M25569 Pain in unspecified knee: Secondary | ICD-10-CM | POA: Diagnosis not present

## 2017-08-24 ENCOUNTER — Encounter: Payer: Self-pay | Admitting: "Endocrinology

## 2017-08-27 DIAGNOSIS — M25569 Pain in unspecified knee: Secondary | ICD-10-CM | POA: Diagnosis not present

## 2017-08-27 DIAGNOSIS — I639 Cerebral infarction, unspecified: Secondary | ICD-10-CM | POA: Diagnosis not present

## 2017-08-27 DIAGNOSIS — M545 Low back pain: Secondary | ICD-10-CM | POA: Diagnosis not present

## 2017-08-29 ENCOUNTER — Ambulatory Visit (INDEPENDENT_AMBULATORY_CARE_PROVIDER_SITE_OTHER): Payer: Medicare Other | Admitting: *Deleted

## 2017-08-29 DIAGNOSIS — I639 Cerebral infarction, unspecified: Secondary | ICD-10-CM | POA: Diagnosis not present

## 2017-08-29 DIAGNOSIS — M545 Low back pain: Secondary | ICD-10-CM | POA: Diagnosis not present

## 2017-08-29 DIAGNOSIS — I6930 Unspecified sequelae of cerebral infarction: Secondary | ICD-10-CM | POA: Diagnosis not present

## 2017-08-29 DIAGNOSIS — Z8679 Personal history of other diseases of the circulatory system: Secondary | ICD-10-CM

## 2017-08-29 DIAGNOSIS — Z5181 Encounter for therapeutic drug level monitoring: Secondary | ICD-10-CM

## 2017-08-29 DIAGNOSIS — M25569 Pain in unspecified knee: Secondary | ICD-10-CM | POA: Diagnosis not present

## 2017-08-29 LAB — POCT INR: INR: 2.8 (ref 2.0–3.0)

## 2017-08-29 NOTE — Patient Instructions (Signed)
Continue coumadin 5mg  daily except 2.5mg  on Mondays and Fridays Recheck in 4 weeks

## 2017-09-01 IMAGING — MR MR ABDOMEN WO/W CM
10 of 18 series · 24 of 48 positions shown · IV contrast (multihance)
Comparison: 03/11/2016

CLINICAL DATA: Abdominal pain. Evaluate lesion in right lobe of
liver.

EXAM:
MRI ABDOMEN WITHOUT AND WITH CONTRAST
TECHNIQUE: Multiplanar multisequence MR imaging of the abdomen was performed
both before and after the administration of intravenous contrast.
CONTRAST:  15mL MULTIHANCE GADOBENATE DIMEGLUMINE 529 MG/ML IV SOLN

[Series 3: T2 · coronal · 5.0mm · 1.26mm/px · 1 of 40 slices shown (1 of 2)]
[im 1/40]
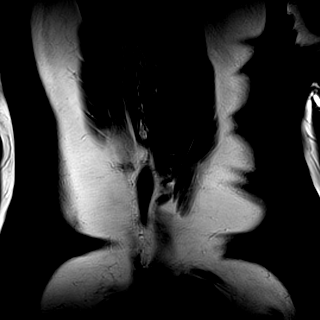

[Series 5: t2fs axial blade · axial · 4.0mm · 1.16mm/px · 1 of 48 slices shown]
[im 1/48]
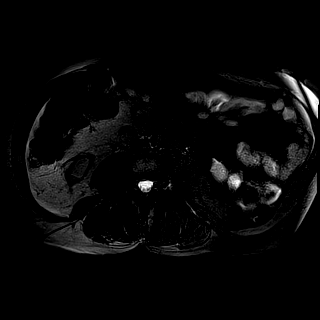

[Series 6: DWI · axial · 5.0mm · 0.99mm/px · z∈[-104,+130]mm · 2 of 80 slices shown (1 of 2)]
[im 1/80]
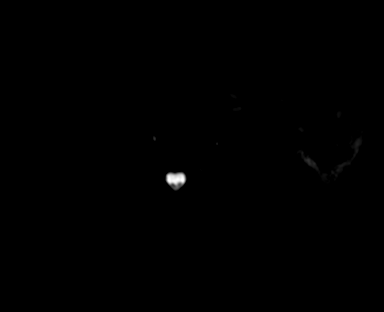
[im 80/80]
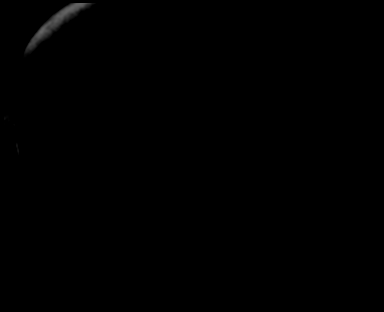

[Series 7: DWI · axial · 5.0mm · 0.99mm/px · z∈[-104,+130]mm · 2 of 40 slices shown (2 of 2)]
[im 1/40]
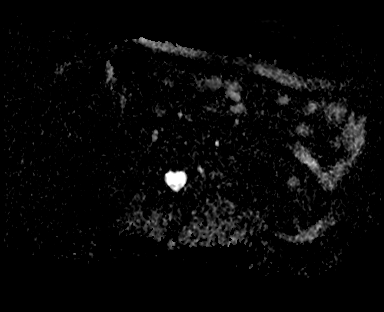
[im 40/40]
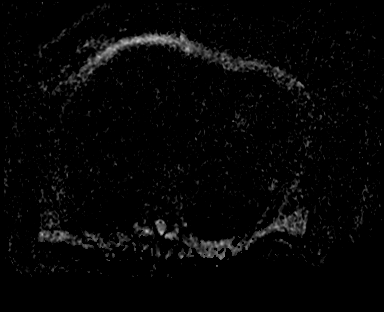

[Series 8: bSSFP · axial · 4.0mm · 0.66mm/px · z∈[-112,+164]mm · 3 of 70 slices shown]
[im 1/70]
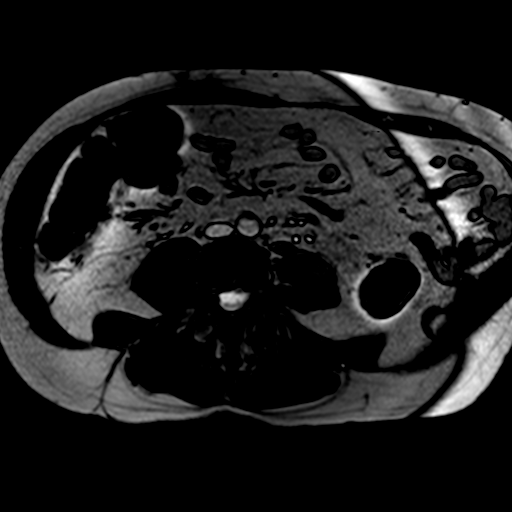
[im 35/70]
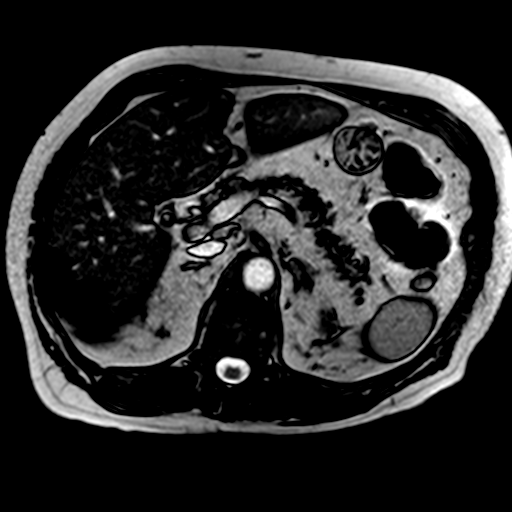
[im 70/70]
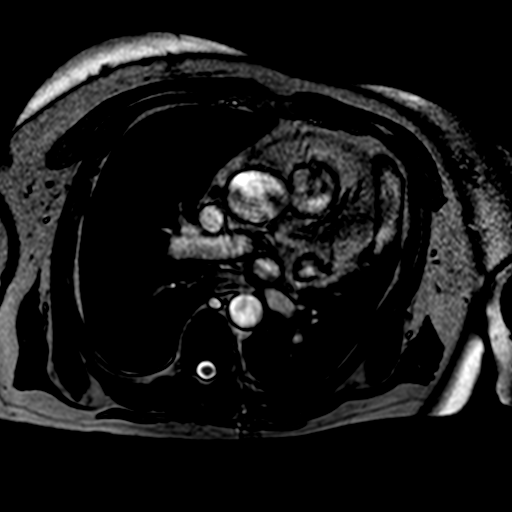

[Series 9: T1 · axial · 4.0mm · 0.59mm/px · z∈[-100,+152]mm · 3 of 64 slices shown (1 of 2)]
[im 1/64]
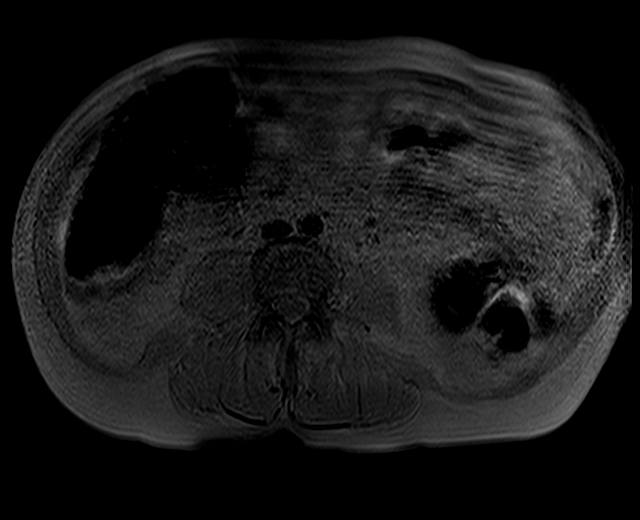
[im 32/64]
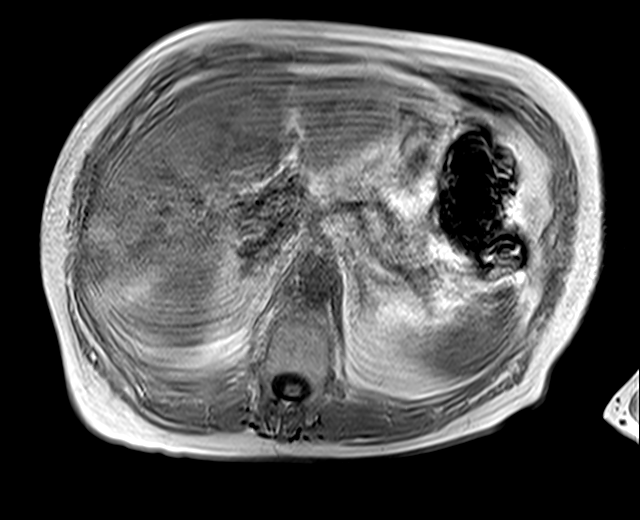
[im 64/64]
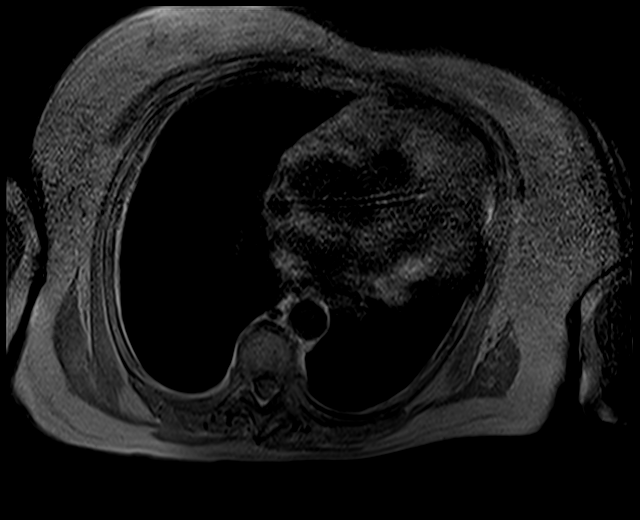

[Series 10: T1 · axial · 4.0mm · 0.59mm/px · z∈[-100,+152]mm · 3 of 64 slices shown (2 of 2)]
[im 1/64]
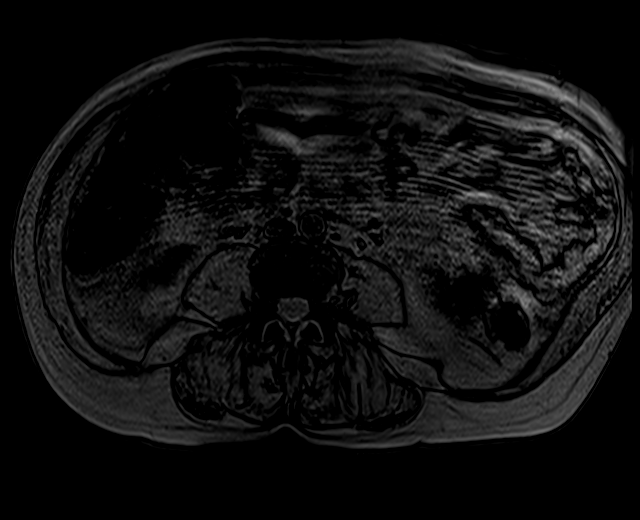
[im 32/64]
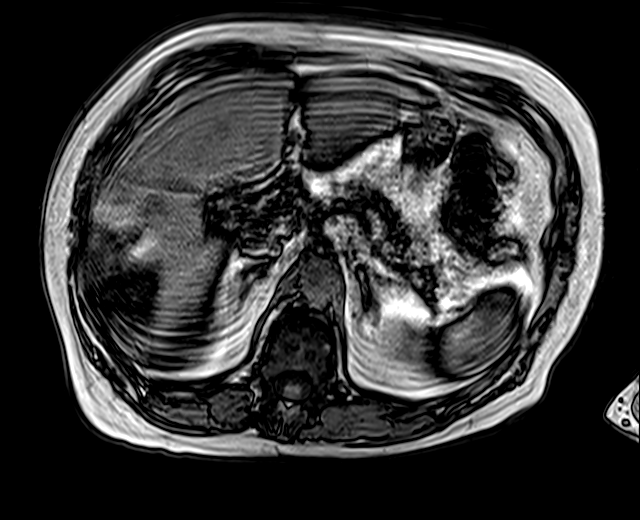
[im 64/64]
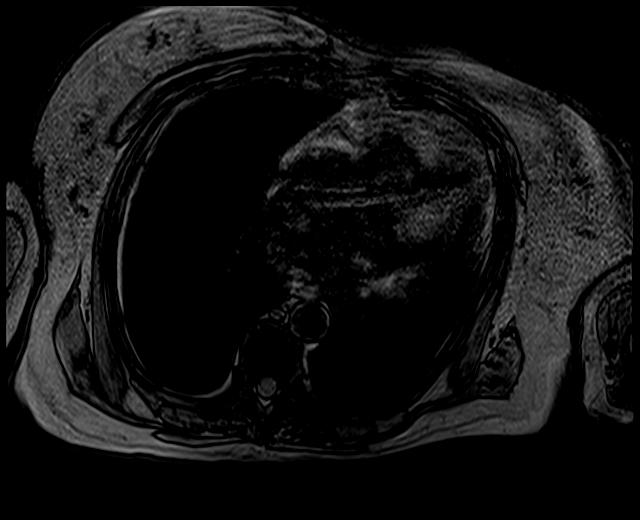

[Series 13: pre test · axial · non-contrast · 3.5mm · 0.59mm/px · z∈[-110,+139]mm · 3 of 72 slices shown]
[im 1/72]
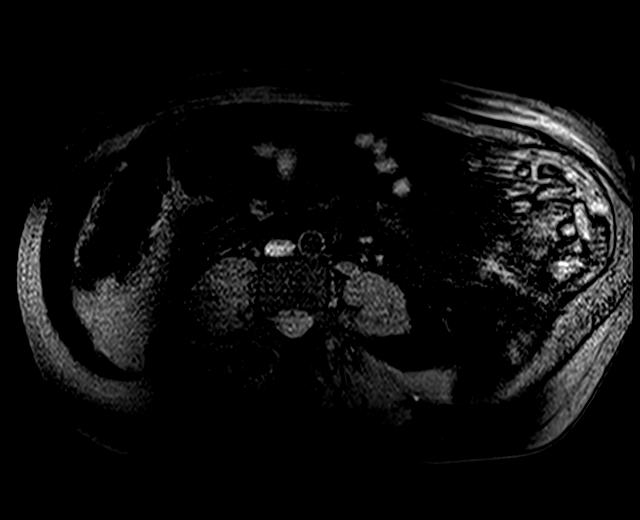
[im 36/72]
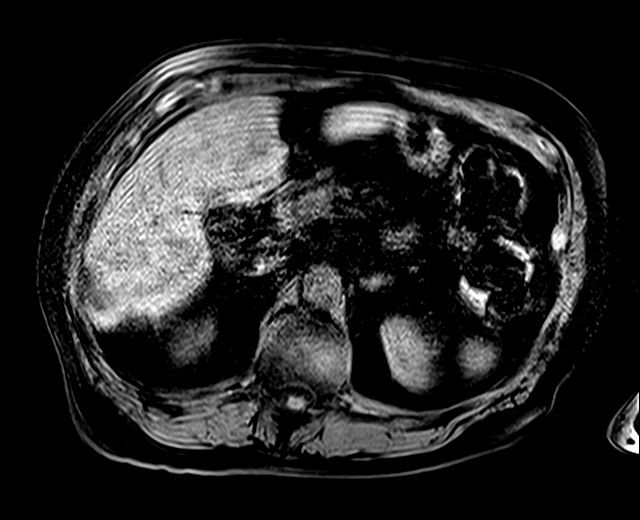
[im 72/72]
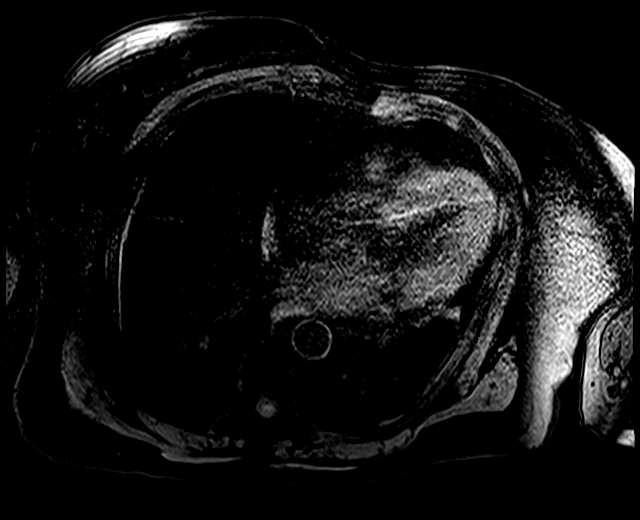

[Series 21: t1fs coronal post · coronal · 2.5mm · 1.32mm/px · 4 of 96 slices shown]
[im 1/96]
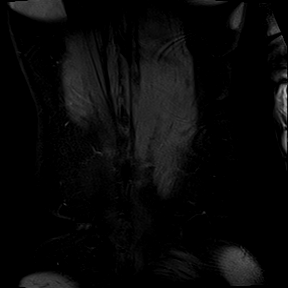
[im 32/96]
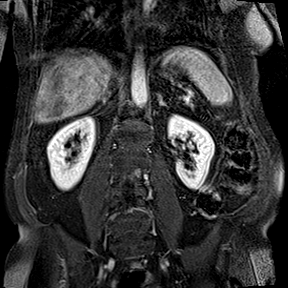
[im 64/96]
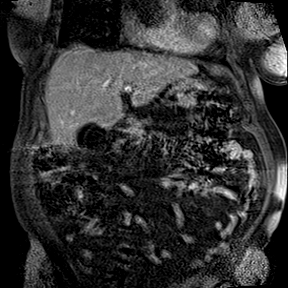
[im 96/96]
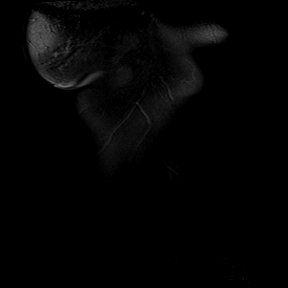

[Series 22: T2 · axial · 5.0mm · 1.48mm/px · z∈[-91,+143]mm · 2 of 40 slices shown (2 of 2)]
[im 1/40]
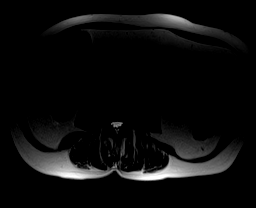
[im 40/40]
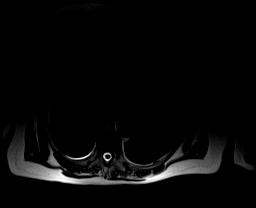

[24 of 48 positions shown; findings below may reference images not displayed]

FINDINGS: Exam detail is diminished secondary to motion artifact.

Lower chest: No acute findings.

Hepatobiliary: There is a stone within the deep tendon portion of
the gallbladder measuring 7 mm, image 25 of series 2. No biliary
ductal dilatation. Focal area of fatty deposition within the lateral
aspect of the right lobe of liver is again identified, image number
30 of series 10. This measures 5.2 by 5.0 cm, image 32 of series 8.
Within this area there is a ET to hyperintense structure which
measures 2.4 by 2.1 cm, image 22 of series 5. On the corresponding
postcontrast images there is peripheral discontinuous and nodular
enhancement associated with this T2 hyperintense structure which
exhibits delayed filling. The appearance is compatible with a liver
hemangioma. No suspicious liver lesions identified at this time peer

Pancreas: No mass, inflammatory changes, or other parenchymal
abnormality identified.

Spleen:  Within normal limits in size and appearance.

Adrenals/Urinary Tract: No masses identified. No evidence of
hydronephrosis.

Stomach/Bowel: Visualized portions within the abdomen are
unremarkable.

Vascular/Lymphatic: No pathologically enlarged lymph nodes
identified. No abdominal aortic aneurysm demonstrated.

Other:  None.

Musculoskeletal: Lumbar scoliosis noted. The cyst associated with
the left ovary is again noted measuring 3.4 cm, image 20 of series 3
peer
IMPRESSION: 1. Right lobe of liver hemangioma. Surrounding progressive focal
fatty deposition is identified.
2. Gallstone.
3. Ovary cyst measuring 3.4 cm. This is almost certainly benign, but
follow up ultrasound is recommended in 1 year according to the
Society of Radiologists in [place] Consensus Conference
Statement (Anna C Icedo et al. Management of Asymptomatic Ovarian and
Other Adnexal Cysts Imaged at US: Society of Radiologists in
4. Lumbar scoliosis.

## 2017-09-04 DIAGNOSIS — M545 Low back pain: Secondary | ICD-10-CM | POA: Diagnosis not present

## 2017-09-04 DIAGNOSIS — M25569 Pain in unspecified knee: Secondary | ICD-10-CM | POA: Diagnosis not present

## 2017-09-04 DIAGNOSIS — I639 Cerebral infarction, unspecified: Secondary | ICD-10-CM | POA: Diagnosis not present

## 2017-09-07 ENCOUNTER — Other Ambulatory Visit: Payer: Self-pay | Admitting: Cardiology

## 2017-09-07 ENCOUNTER — Other Ambulatory Visit: Payer: Self-pay | Admitting: "Endocrinology

## 2017-09-07 DIAGNOSIS — M25569 Pain in unspecified knee: Secondary | ICD-10-CM | POA: Diagnosis not present

## 2017-09-07 DIAGNOSIS — I639 Cerebral infarction, unspecified: Secondary | ICD-10-CM | POA: Diagnosis not present

## 2017-09-07 DIAGNOSIS — M545 Low back pain: Secondary | ICD-10-CM | POA: Diagnosis not present

## 2017-09-11 DIAGNOSIS — M25569 Pain in unspecified knee: Secondary | ICD-10-CM | POA: Diagnosis not present

## 2017-09-11 DIAGNOSIS — I639 Cerebral infarction, unspecified: Secondary | ICD-10-CM | POA: Diagnosis not present

## 2017-09-11 DIAGNOSIS — M545 Low back pain: Secondary | ICD-10-CM | POA: Diagnosis not present

## 2017-09-13 DIAGNOSIS — M545 Low back pain: Secondary | ICD-10-CM | POA: Diagnosis not present

## 2017-09-13 DIAGNOSIS — M25569 Pain in unspecified knee: Secondary | ICD-10-CM | POA: Diagnosis not present

## 2017-09-13 DIAGNOSIS — I639 Cerebral infarction, unspecified: Secondary | ICD-10-CM | POA: Diagnosis not present

## 2017-09-15 NOTE — Progress Notes (Addendum)
Psychiatric Initial Adult Assessment   Patient Identification: [name] MRN:  [id number] Date of Evaluation:  09/19/2017 Referral Source: Dr/ [name] Chief Complaint:   Chief Complaint    Psychiatric Evaluation; Schizophrenia     Visit Diagnosis:    ICD-10-CM   1. Schizophrenia, unspecified type ([place]) F20.9     History of Present Illness:   [name] is a 72 y.o. year old female with a history of schizophrenia, SVT, DVT, history of stroke on warfarin, type II diabetes, hypertension, GERD, who is referred for schizophrenia.   The majority of the history is provide from the staff at [facility].  She came here to transfer the care from Dr. Camie Patience. She is at [employer] for nine years. She used to live with her husband, who deceased after 28 years of marriage. The patient has been feeling depressed, "not so good." She has been feeling this way for many years. She likes her roommate. She celebrated her birthday with her daughter last month. She likes to watch soap opera. She meets with her brother in [place] once a month. She has fair sleep. She has decreased appetite. She has memory loss. She denies SI. She does not recall any history of having AH, VH, paranoia. She feels anxious. She denies panic attacks. She complains of chronic dizziness. She denies alcohol use for the past three years. She denies drug use.   [name], caregiver at [facility] long term care presents to the interview.  [name] tends to stay in the bed most of the time. She will needs assistance to get up from the bed and transfer to wheelchair. She occasionally complains of hip pain. No significant altercation or behavioral issues. [name] has poor appetite. She takes medication regularly when those are administered.   .Functional Status Instrumental Activities of Daily Living (IADLs):  [name] is independent in the following: Requires assistance with the following: managing finances, medications,  driving   Activities of Daily Living (ADLs):  [name] is dependent in the following: bathing and hygiene, feeding, continence, grooming and toileting, walking   Wt Readings from Last 3 Encounters:  07/06/17 151 lb (68.5 kg)  06/27/17 138 lb (62.6 kg)  03/28/17 154 lb 5.2 oz (70 kg)   Per PMP,  On ambien, clonazepam filled on 09/07/2017   Associated Signs/Symptoms: Depression Symptoms:  depressed mood, fatigue, anxiety, (Hypo) Manic Symptoms:  denies decreased need for sleep, euphoria Anxiety Symptoms:  Excessive Worry, Psychotic Symptoms:  denies AH, VH, parnaoia PTSD Symptoms: Negative  Past Psychiatric History:  Outpatient: Dr. Camie Patience  Psychiatry admission: denies Previous suicide attempt: denies Past trials of medication: does not recall History of violence: denies  Previous Psychotropic Medications: Yes   Substance Abuse History in the last 12 months:  No.  Consequences of Substance Abuse: NA  Past Medical History:  Past Medical History:  Diagnosis Date  . Anxiety   . Arthritis   . Dependence on wheelchair    pivot/transfers  . Depression    History of psychosis and previous suicide attempt  . DVT, lower extremity, recurrent ([facility])    Long-term Coumadin per Dr. Legrand Rams  . Essential hypertension   . GERD (gastroesophageal reflux disease)   . Hemiplegia ([place]) 2010   Left side  . History of stroke    Acute infarct and right cerebral white matter small vessel disease 12/10  . Leg DVT (deep venous thromboembolism), acute ([place]) 2006  . Schizophrenia ([place])   . Stroke (  [place])    left sided weakness  . Type 2 diabetes mellitus ([place])     Past Surgical History:  Procedure Laterality Date  . BACK SURGERY    . BIOPSY N/A 11/24/2014   Procedure: BIOPSY;  Surgeon: Danie Binder, MD;  Location: [facility];  Service: Endoscopy;  Laterality: N/A;  . BIOPSY  05/17/2016   Procedure: BIOPSY;  Surgeon: Danie Binder, MD;  Location: [facility];  Service:  Endoscopy;;  gastric biopsy  . COLONOSCOPY WITH PROPOFOL N/A 11/24/2014   Dr. Rudie Meyer polyps removed/moderate sized internal hemorrhoids, tubular adenomas. Next surveillance in 3 years  . ESOPHAGOGASTRODUODENOSCOPY (EGD) WITH PROPOFOL N/A 11/24/2014   Dr. Clayburn Pert HH/patent stricture at the gastroesophageal junction, mild non-erosive gastritis, path negative for H.pylori or celiac sprue  . ESOPHAGOGASTRODUODENOSCOPY (EGD) WITH PROPOFOL N/A 05/17/2016   Procedure: ESOPHAGOGASTRODUODENOSCOPY (EGD) WITH PROPOFOL;  Surgeon: Danie Binder, MD;  Location: [facility];  Service: Endoscopy;  Laterality: N/A;  12:45pm  . GIVENS CAPSULE STUDY N/A 12/11/2014   MULTILPLE EROSION IN the stomach WITH ACTIVE OOZING. OCCASIONAL EROSIONS AND RARE ULCER SEEN IN PROXIMAL SMALL BOWEL . No masses or AVMs SEEN. NO OLD BLOOD OR FRESH BLOOD SEEN.   . POLYPECTOMY N/A 11/24/2014   Procedure: POLYPECTOMY;  Surgeon: Danie Binder, MD;  Location: [facility];  Service: Endoscopy;  Laterality: N/A;  . SAVORY DILATION N/A 05/17/2016   Procedure: SAVORY DILATION;  Surgeon: Danie Binder, MD;  Location: [facility];  Service: Endoscopy;  Laterality: N/A;    Family Psychiatric History:  denies  Family History:  Family History  Problem Relation Age of Onset  . Hypertension Mother   . Colon cancer Neg Hx     Social History:   Social History   Socioeconomic History  . Marital status: Widowed    Spouse name: Not on file  . Number of children: Not on file  . Years of education: Not on file  . Highest education level: Not on file  Occupational History  . Not on file  Social Needs  . Financial resource strain: Not on file  . Food insecurity:    Worry: Not on file    Inability: Not on file  . Transportation needs:    Medical: Not on file    Non-medical: Not on file  Tobacco Use  . Smoking status: Former Smoker    Packs/day: 0.25    Years: 20.00    Pack years: 5.00    Types: Cigarettes    Last attempt to  quit: 01/24/1995    Years since quitting: 22.6  . Smokeless tobacco: Never Used  Substance and Sexual Activity  . Alcohol use: No    Alcohol/week: 0.0 standard drinks  . Drug use: No  . Sexual activity: Never    Birth control/protection: Post-menopausal  Lifestyle  . Physical activity:    Days per week: Not on file    Minutes per session: Not on file  . Stress: Not on file  Relationships  . Social connections:    Talks on phone: Not on file    Gets together: Not on file    Attends religious service: Not on file    Active member of club or organization: Not on file    Attends meetings of clubs or organizations: Not on file    Relationship status: Not on file  Other Topics Concern  . Not on file  Social History Narrative  . Not on file    Additional  Social History:  Widow, her husband deceased after 25 years of marriage in 2010.  She grew up in [place], reports good relationship with her parents in childhood She has one daughter, 85 year old.  Work: used to work for [employer], on disability around ten years ago after her back surgery  Allergies:   Allergies  Allergen Reactions  . Sulfa Antibiotics Rash  . Sulfonamide Derivatives Rash    REACTION: rash    Metabolic Disorder Labs: Lab Results  Component Value Date   HGBA1C 6.5 (H) 03/20/2017   MPG 140 03/20/2017   MPG 143 12/18/2016   No results found for: PROLACTIN Lab Results  Component Value Date   CHOL 133 10/07/2016   TRIG 252 (H) 10/07/2016   HDL 28 (L) 10/07/2016   CHOLHDL 4.8 10/07/2016   VLDL 50 (H) 10/07/2016   LDLCALC 55 10/07/2016   LDLCALC (H) 01/03/2009    143        Total Cholesterol/HDL:CHD Risk Coronary Heart Disease Risk Table                     Men   Women  1/2 Average Risk   3.4   3.3  Average Risk       5.0   4.4  2 X Average Risk   9.6   7.1  3 X Average Risk  23.4   11.0        Use the calculated Patient Ratio above and the CHD Risk Table to determine the patient's CHD Risk.         ATP III CLASSIFICATION (LDL):  <100     mg/dL   Optimal  100-129  mg/dL   Near or Above                    Optimal  130-159  mg/dL   Borderline  160-189  mg/dL   High  >190     mg/dL   Very High     Current Medications: Current Outpatient Medications  Medication Sig Dispense Refill  . AMITIZA 8 MCG capsule Take 8 mcg by mouth daily with breakfast.     . calcium carbonate (TUMS - DOSED IN MG ELEMENTAL CALCIUM) 500 MG chewable tablet Chew 1 tablet by mouth 4 (four) times daily as needed for indigestion or heartburn.     . cholecalciferol (VITAMIN D) 1000 units tablet Take 2,000 Units by mouth daily.    . clonazePAM (KLONOPIN) 0.5 MG tablet Take 1 tablet (0.5 mg total) by mouth 4 (four) times daily. 30 tablet 0  . docusate sodium (COLACE) 100 MG capsule Take 200 mg by mouth at bedtime.     . DULoxetine (CYMBALTA) 60 MG capsule Take 120 mg by mouth daily.     Marland Kitchen ENULOSE 10 GM/15ML SOLN Take 20 g by mouth 2 (two) times daily as needed.     Marland Kitchen HYDROcodone-acetaminophen (NORCO/VICODIN) 5-325 MG tablet Take 1 tablet by mouth 3 (three) times daily. 30 tablet 0  . hydrocortisone (ANUSOL-HC) 2.5 % rectal cream Place 1 application rectally 2 (two) times daily.    . Insulin Aspart (NOVOLOG FLEXPEN [place]) Inject 15-21 Units into the skin 3 (three) times daily before meals.    . iron polysaccharides (NIFEREX) 150 MG capsule Take 1 capsule (150 mg total) by mouth daily. 30 capsule 11  . latanoprost (XALATAN) 0.005 % ophthalmic solution Place 1 drop into both eyes at bedtime.    Marland Kitchen LEVEMIR FLEXTOUCH 100 UNIT/ML  Pen INJECT 40 UNITS SUBCUTANEOUSLY AT BEDTIME. 15 mL 3  . loperamide (IMODIUM) 2 MG capsule Take 4 mg by mouth daily as needed for diarrhea or loose stools. Take 2 capsules initially then take 2 after each loose stool per MAR    . metoprolol succinate (TOPROL-XL) 25 MG 24 hr tablet Take 0.5 tablets (12.5 mg total) by mouth daily.    . midodrine (PROAMATINE) 2.5 MG tablet Take 2.5 mg by mouth 3  (three) times daily with meals.     . mirtazapine (REMERON) 30 MG tablet Take 30 mg by mouth at bedtime.      . naloxegol oxalate (MOVANTIK) 12.5 MG TABS tablet Take 12.5 mg by mouth daily.    Marland Kitchen omeprazole (PRILOSEC) 40 MG capsule Take 40 mg by mouth daily.    . ondansetron (ZOFRAN) 4 MG tablet Take 1 tablet (4 mg total) by mouth 4 (four) times daily -  before meals and at bedtime. 120 tablet 3  . polyethylene glycol powder (GLYCOLAX/MIRALAX) powder Take 17 g by mouth daily. 255 g 0  . polyvinyl alcohol (ARTIFICIAL TEARS) 1.4 % ophthalmic solution Place 1 drop into both eyes 3 (three) times daily.    . risperiDONE (RISPERDAL) 3 MG tablet Take 3 mg by mouth 2 (two) times daily.     . simvastatin (ZOCOR) 20 MG tablet Take 20 mg by mouth at bedtime.     . solifenacin (VESICARE) 5 MG tablet Take 5 mg by mouth daily.    Marland Kitchen tiZANidine (ZANAFLEX) 2 MG tablet Take 2 mg by mouth daily.    Marland Kitchen warfarin (COUMADIN) 5 MG tablet TAKE 1 TABLET BY MOUTH ONCE DAILY ON SUN.,TUES.,WED.,THURS,SAT .TAKE 1/2 TABLET (2.5mg ) ON MON., & FRIDAY. 26 tablet 3  . zolpidem (AMBIEN) 10 MG tablet Take 1 tablet (10 mg total) by mouth at bedtime. 5 tablet 0  . ARIPiprazole (ABILIFY) 2 MG tablet Take 1 tablet (2 mg total) by mouth daily. 90 tablet 0   No current facility-administered medications for this visit.     Neurologic: Headache: No Seizure: No Paresthesias:No  Musculoskeletal: Strength & Muscle Tone: within normal limits Gait & Station: normal Patient leans: N/A  Psychiatric Specialty Exam: Review of Systems  Neurological: Positive for dizziness.  Psychiatric/Behavioral: Positive for depression and memory loss. Negative for hallucinations, substance abuse and suicidal ideas. The patient is nervous/anxious and has insomnia.   All other systems reviewed and are negative.   Blood pressure (!) 74/50, pulse 80, height 5\' 5"  (1.651 m), SpO2 96 %.Body mass index is 25.13 kg/m.  General Appearance: Fairly Groomed   Eye Contact:  Good  Speech:  Clear and Coherent  Volume:  Normal  Mood:  Depressed  Affect:  Appropriate, Congruent and Constricted- later smiled  Thought Process:  Coherent  Orientation:  Full (Time, Place, and Person)  Thought Content:  Logical  Suicidal Thoughts:  No  Homicidal Thoughts:  No  Memory:  Immediate;   Fair  Judgement:  Fair  Insight:  Present  Psychomotor Activity:  Decreased  Concentration:  Concentration: Good and Attention Span: Good  Recall:  Poor  Fund of Knowledge:Good  Language: Good  Akathisia:  No  Handed:  Right  AIMS (if indicated):  No tremors, no rigidity  Assets:  Desire for Improvement Social Support  ADL's:  Impaired  Cognition: Impaired,  Mild  Sleep:  fair  Mini cog 1/3 (wrote numbers inside the right side of circle, place hands pointing 12 and 2.) Delayed recall 1/3  Oriented  x 3 except date "27th" Unable to correctly copy pentagon  Assessment [name] is a 72 y.o. year old female with a history of schizophrenia, SVT, DVT, history of stroke on warfarin, type II diabetes, hypertension, GERD, who is referred for schizophrenia.   # Schizophrenia by history # MDD,  Exam is notable for linear thought process, although patient does not elaborate the history unless prompted.  Patient reports chronic depression and anxiety.  Will continue duloxetine, mirtazapine to target depression, anxiety. Will continue Abilify as adjunctive treatment for depression and also to target schizophrenia by history. Will taper down risperidone given hypotension and to avoid polypharmacy. Will taper down clonazepam slowly to avoid polypharmacy. Will continue Ambien at this time for insomnia; will plan to taper it off in the future. Noted that although she does have charted history of schizophrenia, she denies any significant psychotic episode. Will obtain record from her psychiatrist.   # Cognitive deficits  Patient does have cognitive deficits characterized by  Mini Cog. Will continue to monitor  # Hypotension Patient has significant hypotension on today's visit. The staff is advised to recheck BP at [facility] and contact her PCP for appropriate follow up. She is on polypharmacy, and is on many psychotropics to cause orthostatic hypotension/dizziness. Will slowly taper off these medication to avoid adverse reaction   Plan I have reviewed and updated plans as below 1. Continue duloxetine 120 mg daily 2. Continue Mirtazapine 30 mg at night 3. Continue Abilfy 2 mg daily 4. Decrease risperidone 3 mg at night  5. Zolpidem 10 mg at night  6. Decrease clonazepam 0.5 mg three times a day for anxiety 7. Return to clinic in three moths for 30 mins 8. Obtain record from Dr. Camie Patience  The patient demonstrates the following risk factors for suicide: Chronic risk factors for suicide include: psychiatric disorder of schizophrenia by history, depression. Acute risk factors for suicide include: unemployment. Protective factors for this patient include: positive social support and hope for the future. Considering these factors, the overall suicide risk at this point appears to be low. Patient is appropriate for outpatient follow up.   Treatment Plan Summary: Plan as above   [name], MD 8/28/201912:03 PM

## 2017-09-18 DIAGNOSIS — I639 Cerebral infarction, unspecified: Secondary | ICD-10-CM | POA: Diagnosis not present

## 2017-09-18 DIAGNOSIS — M79674 Pain in right toe(s): Secondary | ICD-10-CM | POA: Diagnosis not present

## 2017-09-18 DIAGNOSIS — M545 Low back pain: Secondary | ICD-10-CM | POA: Diagnosis not present

## 2017-09-18 DIAGNOSIS — B351 Tinea unguium: Secondary | ICD-10-CM | POA: Diagnosis not present

## 2017-09-18 DIAGNOSIS — M79675 Pain in left toe(s): Secondary | ICD-10-CM | POA: Diagnosis not present

## 2017-09-18 DIAGNOSIS — M25569 Pain in unspecified knee: Secondary | ICD-10-CM | POA: Diagnosis not present

## 2017-09-19 ENCOUNTER — Ambulatory Visit (INDEPENDENT_AMBULATORY_CARE_PROVIDER_SITE_OTHER): Payer: Medicare Other | Admitting: Psychiatry

## 2017-09-19 ENCOUNTER — Encounter (HOSPITAL_COMMUNITY): Payer: Self-pay | Admitting: Psychiatry

## 2017-09-19 VITALS — BP 74/50 | HR 80 | Ht 65.0 in

## 2017-09-19 DIAGNOSIS — I6350 Cerebral infarction due to unspecified occlusion or stenosis of unspecified cerebral artery: Secondary | ICD-10-CM | POA: Diagnosis not present

## 2017-09-19 DIAGNOSIS — F209 Schizophrenia, unspecified: Secondary | ICD-10-CM | POA: Diagnosis not present

## 2017-09-19 DIAGNOSIS — I10 Essential (primary) hypertension: Secondary | ICD-10-CM | POA: Diagnosis not present

## 2017-09-19 MED ORDER — ARIPIPRAZOLE 2 MG PO TABS: 2.0000 mg | ORAL_TABLET | Freq: Every day | ORAL | 0 refills | Status: DC

## 2017-09-19 NOTE — Patient Instructions (Signed)
1. Continue duloxetine 120 mg daily 2. Continue Mirtazapine 30 mg at night 3. Continue Abilfy 2 mg daily 4. Decrease risperidone 3 mg at night  5. Zolpidem 10 mg at night  6. Decrease clonazepam 0.5 mg three times a day for anxiety 7. Return to clinic in three moths for 30 mins 8. Obtain record from Dr. Franchot Mimes

## 2017-09-20 DIAGNOSIS — M545 Low back pain: Secondary | ICD-10-CM | POA: Diagnosis not present

## 2017-09-20 DIAGNOSIS — M25569 Pain in unspecified knee: Secondary | ICD-10-CM | POA: Diagnosis not present

## 2017-09-20 DIAGNOSIS — I639 Cerebral infarction, unspecified: Secondary | ICD-10-CM | POA: Diagnosis not present

## 2017-09-25 ENCOUNTER — Encounter: Payer: Self-pay | Admitting: "Endocrinology

## 2017-09-25 ENCOUNTER — Ambulatory Visit (INDEPENDENT_AMBULATORY_CARE_PROVIDER_SITE_OTHER): Payer: Medicare Other | Admitting: "Endocrinology

## 2017-09-25 VITALS — BP 100/63 | HR 72

## 2017-09-25 DIAGNOSIS — E1159 Type 2 diabetes mellitus with other circulatory complications: Secondary | ICD-10-CM | POA: Diagnosis not present

## 2017-09-25 DIAGNOSIS — M545 Low back pain: Secondary | ICD-10-CM | POA: Diagnosis not present

## 2017-09-25 DIAGNOSIS — I639 Cerebral infarction, unspecified: Secondary | ICD-10-CM | POA: Diagnosis not present

## 2017-09-25 DIAGNOSIS — I10 Essential (primary) hypertension: Secondary | ICD-10-CM

## 2017-09-25 DIAGNOSIS — E782 Mixed hyperlipidemia: Secondary | ICD-10-CM

## 2017-09-25 DIAGNOSIS — M25569 Pain in unspecified knee: Secondary | ICD-10-CM | POA: Diagnosis not present

## 2017-09-25 NOTE — Progress Notes (Signed)
Endocrinology follow-up note   Subjective:    Patient ID: [name], female    DOB: [birth year],    Past Medical History:  Diagnosis Date  . Anxiety   . Arthritis   . Dependence on wheelchair    pivot/transfers  . Depression    History of psychosis and previous suicide attempt  . DVT, lower extremity, recurrent (HCC)    Long-term Coumadin per Dr. Legrand Rams  . Essential hypertension   . GERD (gastroesophageal reflux disease)   . Hemiplegia ([place]) 2010   Left side  . History of stroke    Acute infarct and right cerebral white matter small vessel disease 12/10  . Leg DVT (deep venous thromboembolism), acute ([place]) 2006  . Schizophrenia ([place])   . Stroke [facility])    left sided weakness  . Type 2 diabetes mellitus ([place])    Past Surgical History:  Procedure Laterality Date  . BACK SURGERY    . BIOPSY N/A 11/24/2014   Procedure: BIOPSY;  Surgeon: Danie Binder, MD;  Location: [facility];  Service: Endoscopy;  Laterality: N/A;  . BIOPSY  05/17/2016   Procedure: BIOPSY;  Surgeon: Danie Binder, MD;  Location: [facility];  Service: Endoscopy;;  gastric biopsy  . COLONOSCOPY WITH PROPOFOL N/A 11/24/2014   Dr. Rudie Meyer polyps removed/moderate sized internal hemorrhoids, tubular adenomas. Next surveillance in 3 years  . ESOPHAGOGASTRODUODENOSCOPY (EGD) WITH PROPOFOL N/A 11/24/2014   Dr. Clayburn Pert HH/patent stricture at the gastroesophageal junction, mild non-erosive gastritis, path negative for H.pylori or celiac sprue  . ESOPHAGOGASTRODUODENOSCOPY (EGD) WITH PROPOFOL N/A 05/17/2016   Procedure: ESOPHAGOGASTRODUODENOSCOPY (EGD) WITH PROPOFOL;  Surgeon: Danie Binder, MD;  Location: [facility];  Service: Endoscopy;  Laterality: N/A;  12:45pm  . GIVENS CAPSULE STUDY N/A 12/11/2014   MULTILPLE EROSION IN the stomach WITH ACTIVE OOZING. OCCASIONAL EROSIONS AND RARE ULCER SEEN IN PROXIMAL SMALL BOWEL . No masses or AVMs SEEN. NO OLD BLOOD OR FRESH BLOOD SEEN.   . POLYPECTOMY N/A 11/24/2014    Procedure: POLYPECTOMY;  Surgeon: Danie Binder, MD;  Location: [facility];  Service: Endoscopy;  Laterality: N/A;  . SAVORY DILATION N/A 05/17/2016   Procedure: SAVORY DILATION;  Surgeon: Danie Binder, MD;  Location: [facility];  Service: Endoscopy;  Laterality: N/A;   Social History   Socioeconomic History  . Marital status: Widowed    Spouse name: Not on file  . Number of children: Not on file  . Years of education: Not on file  . Highest education level: Not on file  Occupational History  . Not on file  Social Needs  . Financial resource strain: Not on file  . Food insecurity:    Worry: Not on file    Inability: Not on file  . Transportation needs:    Medical: Not on file    Non-medical: Not on file  Tobacco Use  . Smoking status: Former Smoker    Packs/day: 0.25    Years: 20.00    Pack years: 5.00    Types: Cigarettes    Last attempt to quit: 01/24/1995    Years since quitting: 22.6  . Smokeless tobacco: Never Used  Substance and Sexual Activity  . Alcohol use: No    Alcohol/week: 0.0 standard drinks  . Drug use: No  . Sexual activity: Never    Birth control/protection: Post-menopausal  Lifestyle  . Physical activity:    Days per week: Not on file    Minutes per session: Not  on file  . Stress: Not on file  Relationships  . Social connections:    Talks on phone: Not on file    Gets together: Not on file    Attends religious service: Not on file    Active member of club or organization: Not on file    Attends meetings of clubs or organizations: Not on file    Relationship status: Not on file  Other Topics Concern  . Not on file  Social History Narrative  . Not on file   Outpatient Encounter Medications as of 09/25/2017  Medication Sig  . AMITIZA 8 MCG capsule Take 8 mcg by mouth daily with breakfast.   . ARIPiprazole (ABILIFY) 2 MG tablet Take 1 tablet (2 mg total) by mouth daily.  . calcium carbonate (TUMS - DOSED IN MG ELEMENTAL CALCIUM) 500 MG chewable  tablet Chew 1 tablet by mouth 4 (four) times daily as needed for indigestion or heartburn.   . cholecalciferol (VITAMIN D) 1000 units tablet Take 2,000 Units by mouth daily.  . clonazePAM (KLONOPIN) 0.5 MG tablet Take 1 tablet (0.5 mg total) by mouth 4 (four) times daily.  Marland Kitchen docusate sodium (COLACE) 100 MG capsule Take 200 mg by mouth at bedtime.   . DULoxetine (CYMBALTA) 60 MG capsule Take 120 mg by mouth daily.   Marland Kitchen ENULOSE 10 GM/15ML SOLN Take 20 g by mouth 2 (two) times daily as needed.   Marland Kitchen HYDROcodone-acetaminophen (NORCO/VICODIN) 5-325 MG tablet Take 1 tablet by mouth 3 (three) times daily.  . hydrocortisone (ANUSOL-HC) 2.5 % rectal cream Place 1 application rectally 2 (two) times daily.  . Insulin Aspart (NOVOLOG FLEXPEN [place]) Inject 10-16 Units into the skin 3 (three) times daily before meals.   . iron polysaccharides (NIFEREX) 150 MG capsule Take 1 capsule (150 mg total) by mouth daily.  Marland Kitchen latanoprost (XALATAN) 0.005 % ophthalmic solution Place 1 drop into both eyes at bedtime.  Marland Kitchen LEVEMIR FLEXTOUCH 100 UNIT/ML Pen INJECT 40 UNITS SUBCUTANEOUSLY AT BEDTIME.  Marland Kitchen loperamide (IMODIUM) 2 MG capsule Take 4 mg by mouth daily as needed for diarrhea or loose stools. Take 2 capsules initially then take 2 after each loose stool per MAR  . metoprolol succinate (TOPROL-XL) 25 MG 24 hr tablet Take 0.5 tablets (12.5 mg total) by mouth daily.  . midodrine (PROAMATINE) 2.5 MG tablet Take 2.5 mg by mouth 3 (three) times daily with meals.   . mirtazapine (REMERON) 30 MG tablet Take 30 mg by mouth at bedtime.    . naloxegol oxalate (MOVANTIK) 12.5 MG TABS tablet Take 12.5 mg by mouth daily.  Marland Kitchen omeprazole (PRILOSEC) 40 MG capsule Take 40 mg by mouth daily.  . ondansetron (ZOFRAN) 4 MG tablet Take 1 tablet (4 mg total) by mouth 4 (four) times daily -  before meals and at bedtime.  . polyethylene glycol powder (GLYCOLAX/MIRALAX) powder Take 17 g by mouth daily.  . polyvinyl alcohol (ARTIFICIAL TEARS) 1.4 %  ophthalmic solution Place 1 drop into both eyes 3 (three) times daily.  . risperiDONE (RISPERDAL) 3 MG tablet Take 3 mg by mouth 2 (two) times daily.   . simvastatin (ZOCOR) 20 MG tablet Take 20 mg by mouth at bedtime.   . solifenacin (VESICARE) 5 MG tablet Take 5 mg by mouth daily.  Marland Kitchen tiZANidine (ZANAFLEX) 2 MG tablet Take 2 mg by mouth daily.  Marland Kitchen warfarin (COUMADIN) 5 MG tablet TAKE 1 TABLET BY MOUTH ONCE DAILY ON SUN.,TUES.,WED.,THURS,SAT .TAKE 1/2 TABLET (2.5mg ) ON MON., & FRIDAY.  Marland Kitchen  zolpidem (AMBIEN) 10 MG tablet Take 1 tablet (10 mg total) by mouth at bedtime.   No facility-administered encounter medications on file as of 09/25/2017.    ALLERGIES: Allergies  Allergen Reactions  . Sulfa Antibiotics Rash  . Sulfonamide Derivatives Rash    REACTION: rash   VACCINATION STATUS: There is no immunization history for the selected administration types on file for this patient.  Diabetes  She presents for her follow-up diabetic visit. She has type 2 diabetes mellitus. Onset time: She was diagnosed at approximate age of 81 years. Her disease course has been fluctuating. There are no hypoglycemic associated symptoms. Pertinent negatives for hypoglycemia include no confusion, pallor or seizures. Pertinent negatives for diabetes include no polydipsia, no polyphagia and no polyuria. There are no hypoglycemic complications. Symptoms are worsening. Diabetic complications include a CVA. Risk factors for coronary artery disease include dyslipidemia, diabetes mellitus, obesity, sedentary lifestyle and hypertension. Current diabetic treatment includes intensive insulin program and oral agent (monotherapy) (She kept requiring large dose of insulin due to consumption of large quantities of processed carbohydrates.Marland Kitchen). Her weight is decreasing steadily. She is following a generally unhealthy diet. When asked about meal planning, she reported none. She never participates in exercise. Her home blood glucose trend is  decreasing steadily. Her breakfast blood glucose range is generally 130-140 mg/dl. Her lunch blood glucose range is generally 130-140 mg/dl. Her dinner blood glucose range is generally 130-140 mg/dl. Her bedtime blood glucose range is generally 130-140 mg/dl. Her overall blood glucose range is 130-140 mg/dl. An ACE inhibitor/angiotensin II receptor blocker is being taken.  Hypertension  This is a chronic problem. The current episode started more than 1 year ago. The problem is controlled. Pertinent negatives include no palpitations or shortness of breath. Risk factors for coronary artery disease include dyslipidemia and diabetes mellitus. Hypertensive end-organ damage includes CVA.  Hyperlipidemia  This is a chronic problem. The current episode started more than 1 year ago. Exacerbating diseases include diabetes. Pertinent negatives include no shortness of breath. Risk factors for coronary artery disease include diabetes mellitus, dyslipidemia, hypertension, obesity and a sedentary lifestyle.    Review of Systems  Constitutional: Negative for unexpected weight change.       Uses walker to get around.   HENT: Negative for trouble swallowing and voice change.   Eyes: Negative for visual disturbance.  Respiratory: Negative for shortness of breath and wheezing.   Cardiovascular: Negative for palpitations and leg swelling.  Gastrointestinal: Negative for diarrhea.  Endocrine: Negative for cold intolerance, heat intolerance, polydipsia, polyphagia and polyuria.  Skin: Negative for color change, pallor and wound.  Neurological: Negative for seizures.  Psychiatric/Behavioral: Negative for confusion and suicidal ideas.    Objective:    BP 100/63   Pulse 72   Wt Readings from Last 3 Encounters:  07/06/17 151 lb (68.5 kg)  06/27/17 138 lb (62.6 kg)  03/28/17 154 lb 5.2 oz (70 kg)    Physical Exam  Constitutional: She is oriented to person, place, and time.  She was found to be lethargic this  morning with a series of low blood pressure readings 83/41, 89/40, and 70/38 with pulse rate of 77.  HENT:  Head: Normocephalic and atraumatic.  She is a mouth breather, has dry mucous membranes.  Eyes: EOM are normal.  Neck: Normal range of motion. Neck supple. No tracheal deviation present. No thyromegaly present.  Cardiovascular: Normal rate and regular rhythm.  Pulmonary/Chest: Effort normal and breath sounds normal.  Abdominal: There is no tenderness.  There is no guarding.  Musculoskeletal: Normal range of motion. She exhibits no edema.  Neurological: She is oriented to person, place, and time.  She is oriented to time, place, and person.  She is on a wheelchair.    Skin: Skin is warm and dry. No rash noted. No erythema. No pallor.  Psychiatric: She has a normal mood and affect. Judgment normal.    Results for orders placed or performed in visit on 08/29/17  POCT INR  Result Value Ref Range   INR 2.8 2.0 - 3.0   Complete Blood Count (Most recent): Lab Results  Component Value Date   WBC 6.8 06/27/2017   HGB 13.8 06/27/2017   HCT 40.8 06/27/2017   MCV 88.9 06/27/2017   PLT 250 06/27/2017   Chemistry (most recent): Lab Results  Component Value Date   NA 138 06/27/2017   K 3.7 06/27/2017   CL 101 06/27/2017   CO2 30 06/27/2017   BUN 11 06/27/2017   CREATININE 0.74 06/27/2017   Diabetic Labs (most recent): Lab Results  Component Value Date   HGBA1C 6.5 (H) 03/20/2017   HGBA1C 6.6 (H) 12/18/2016   HGBA1C 6.7 (H) 10/07/2016   Lipid Panel     Component Value Date/Time   CHOL 133 10/07/2016 0620   TRIG 252 (H) 10/07/2016 0620   HDL 28 (L) 10/07/2016 0620   CHOLHDL 4.8 10/07/2016 0620   VLDL 50 (H) 10/07/2016 0620   LDLCALC 55 10/07/2016 0620     Assessment & Plan:   1. Type 2 diabetes mellitus with vascular disease ([place]) Her diabetes is  complicated by recurrent CVA. Patient came with her logs showing better glucose profile after her Levemir was adjusted at  40 units nightly.  She did not have previsit labs, last visit A1c was 6.5% improving from 8.2%.    - Patient remains at a high risk for more acute and chronic complications of diabetes which include CAD, CVA, CKD, retinopathy, and neuropathy. These are all discussed in detail with the patient.  - I have re-counseled the patient on diet management and weight loss  by adopting a carbohydrate restricted / protein rich  Diet.  - Patient is advised to stick to a routine mealtimes to eat 3 meals  a day and avoid unnecessary snacks ( to snack only to correct hypoglycemia).  - I have approached patient with the following individualized plan to manage diabetes and patient agrees.   -  Suggestion is made for her to avoid simple carbohydrates  from her diet including Cakes, Sweet Desserts / Pastries, Ice Cream, Soda (diet and regular), Sweet Tea, Candies, Chips, Cookies, Store Bought Juices, Alcohol in Excess of  1-2 drinks a day, Artificial Sweeteners, and "Sugar-free" Products. This will help patient to have stable blood glucose profile and potentially avoid unintended weight gain.  -I advised her nursing home to continue Levemir 40 units nightly, NovoLog 10-16 units 3 times daily AC for pre-meal blood glucose above 90 mg/dL, associated with strict monitoring of blood glucose 4 times a day- daily before meals and at bedtime. -Adjustment parameters for hypo and hyperglycemia were given in a written document to patient. -Patient is encouraged to call clinic for blood glucose levels less than 70 or above 300 mg /dl.  - she did not tolerate Metformin. - Patient specific target  for A1c; LDL, HDL, Triglycerides, and  Waist Circumference were discussed in detail.  2) BP/HTN: Her blood pressure is controlled without medications at this time.  3) Lipids/HPL: Her recent lipid panel from September 2018 showed LDL of 55.  She is advised to continue her current simvastatin 20 mg p.o. nightly.    4)   Weight/Diet:  exercise, and carbohydrates information provided.  5) Chronic Care/Health Maintenance:  - I have advised 1on 1 assistance at mealtimes and deligent hydration . Patient needs to keep her appointment with Ophthalmology, Podiatrist at least yearly or according to recommendations, and advised to  stay away from smoking. I have recommended yearly flu vaccine and pneumonia vaccination at least every 5 years; and  sleep for at least 7 hours a day.  I advised patient to maintain close follow up with her PCP for primary care needs.  - Time spent with the patient: 25 min, of which >50% was spent in reviewing her blood glucose logs , discussing her hypo- and hyper-glycemic episodes, reviewing her current and  previous labs and insulin doses and developing a plan to avoid hypo- and hyper-glycemia. Please refer to Patient Instructions for Blood Glucose Monitoring and Insulin/Medications Dosing Guide"  in media tab for additional information. [name] participated in the discussions, expressed understanding, and voiced agreement with the above plans.  All questions were answered to her satisfaction. she is encouraged to contact clinic should she have any questions or concerns prior to her return visit.   Follow up plan: Return in about 8 weeks (around 11/20/2017) for Follow up with Pre-visit Labs, Meter, and Logs.  Glade Lloyd, MD Phone: [phone]  Fax: [phone]   This note was partially dictated with voice recognition software. Similar sounding words can be transcribed inadequately or may not  be corrected upon review.  09/25/2017, 1:21 PM

## 2017-09-26 ENCOUNTER — Ambulatory Visit (INDEPENDENT_AMBULATORY_CARE_PROVIDER_SITE_OTHER): Payer: Medicare Other | Admitting: *Deleted

## 2017-09-26 DIAGNOSIS — Z5181 Encounter for therapeutic drug level monitoring: Secondary | ICD-10-CM | POA: Diagnosis not present

## 2017-09-26 DIAGNOSIS — I6930 Unspecified sequelae of cerebral infarction: Secondary | ICD-10-CM

## 2017-09-26 DIAGNOSIS — Z8679 Personal history of other diseases of the circulatory system: Secondary | ICD-10-CM | POA: Diagnosis not present

## 2017-09-26 LAB — POCT INR: INR: 2.7 (ref 2.0–3.0)

## 2017-09-26 NOTE — Patient Instructions (Signed)
Continue coumadin 5mg  daily except 2.5mg  on Mondays and Fridays Recheck in 4 weeks

## 2017-09-27 DIAGNOSIS — I639 Cerebral infarction, unspecified: Secondary | ICD-10-CM | POA: Diagnosis not present

## 2017-09-27 DIAGNOSIS — M25569 Pain in unspecified knee: Secondary | ICD-10-CM | POA: Diagnosis not present

## 2017-09-27 DIAGNOSIS — M545 Low back pain: Secondary | ICD-10-CM | POA: Diagnosis not present

## 2017-10-03 DIAGNOSIS — M545 Low back pain: Secondary | ICD-10-CM | POA: Diagnosis not present

## 2017-10-03 DIAGNOSIS — I639 Cerebral infarction, unspecified: Secondary | ICD-10-CM | POA: Diagnosis not present

## 2017-10-03 DIAGNOSIS — M25569 Pain in unspecified knee: Secondary | ICD-10-CM | POA: Diagnosis not present

## 2017-10-04 ENCOUNTER — Other Ambulatory Visit (HOSPITAL_COMMUNITY): Payer: Self-pay | Admitting: Psychiatry

## 2017-10-04 ENCOUNTER — Telehealth (HOSPITAL_COMMUNITY): Payer: Self-pay | Admitting: *Deleted

## 2017-10-04 DIAGNOSIS — I639 Cerebral infarction, unspecified: Secondary | ICD-10-CM | POA: Diagnosis not present

## 2017-10-04 DIAGNOSIS — M25569 Pain in unspecified knee: Secondary | ICD-10-CM | POA: Diagnosis not present

## 2017-10-04 DIAGNOSIS — M545 Low back pain: Secondary | ICD-10-CM | POA: Diagnosis not present

## 2017-10-04 MED ORDER — ZOLPIDEM TARTRATE 10 MG PO TABS: 10.0000 mg | ORAL_TABLET | Freq: Every day | ORAL | 2 refills | Status: DC

## 2017-10-04 NOTE — Telephone Encounter (Signed)
Dr Modesta Messing  Rx called left message @ front desk for refills on the Ambien

## 2017-10-04 NOTE — Telephone Encounter (Signed)
Done

## 2017-10-04 NOTE — Telephone Encounter (Signed)
Ordered. Could you contact [facility] and asks to discontinue ambien order from Dr. Olen Pel.

## 2017-10-08 ENCOUNTER — Encounter: Payer: Self-pay | Admitting: "Endocrinology

## 2017-10-12 DIAGNOSIS — I639 Cerebral infarction, unspecified: Secondary | ICD-10-CM | POA: Diagnosis not present

## 2017-10-12 DIAGNOSIS — M545 Low back pain: Secondary | ICD-10-CM | POA: Diagnosis not present

## 2017-10-12 DIAGNOSIS — M25569 Pain in unspecified knee: Secondary | ICD-10-CM | POA: Diagnosis not present

## 2017-10-17 ENCOUNTER — Other Ambulatory Visit: Payer: Self-pay | Admitting: Gastroenterology

## 2017-10-24 DIAGNOSIS — Z23 Encounter for immunization: Secondary | ICD-10-CM | POA: Diagnosis not present

## 2017-11-01 ENCOUNTER — Encounter: Payer: Self-pay | Admitting: Gastroenterology

## 2017-11-12 ENCOUNTER — Other Ambulatory Visit (HOSPITAL_COMMUNITY): Payer: Self-pay | Admitting: Psychiatry

## 2017-11-12 ENCOUNTER — Ambulatory Visit (INDEPENDENT_AMBULATORY_CARE_PROVIDER_SITE_OTHER): Payer: Medicare Other | Admitting: *Deleted

## 2017-11-12 ENCOUNTER — Telehealth (HOSPITAL_COMMUNITY): Payer: Self-pay | Admitting: *Deleted

## 2017-11-12 DIAGNOSIS — I6930 Unspecified sequelae of cerebral infarction: Secondary | ICD-10-CM

## 2017-11-12 DIAGNOSIS — Z8679 Personal history of other diseases of the circulatory system: Secondary | ICD-10-CM

## 2017-11-12 DIAGNOSIS — Z5181 Encounter for therapeutic drug level monitoring: Secondary | ICD-10-CM | POA: Diagnosis not present

## 2017-11-12 LAB — POCT INR: INR: 3.9 — AB (ref 2.0–3.0)

## 2017-11-12 MED ORDER — CLONAZEPAM 0.5 MG PO TABS: 0.5000 mg | ORAL_TABLET | Freq: Three times a day (TID) | ORAL | 1 refills | Status: DC

## 2017-11-12 MED ORDER — RISPERIDONE 3 MG PO TABS: 3.0000 mg | ORAL_TABLET | Freq: Every day | ORAL | 0 refills | Status: DC

## 2017-11-12 NOTE — Patient Instructions (Signed)
Hold coumadin tonight, take 2.5mg  tomorrow night then resume 5mg  daily except 2.5mg  on Mondays and Fridays Recheck in 3 weeks

## 2017-11-12 NOTE — Telephone Encounter (Signed)
ordered

## 2017-11-12 NOTE — Telephone Encounter (Signed)
Dr Modesta Messing The nursing home called & LVM  . I called & the Administrator Izola Price  # [phone] stated the [facility] has sent refill request for the Clonazepam 0.5 mg & the Risperidone  3 mg. Medication will be needed today

## 2017-11-14 DIAGNOSIS — I10 Essential (primary) hypertension: Secondary | ICD-10-CM | POA: Diagnosis not present

## 2017-11-14 DIAGNOSIS — G8190 Hemiplegia, unspecified affecting unspecified side: Secondary | ICD-10-CM | POA: Diagnosis not present

## 2017-11-14 DIAGNOSIS — G8929 Other chronic pain: Secondary | ICD-10-CM | POA: Diagnosis not present

## 2017-11-14 DIAGNOSIS — I825Y9 Chronic embolism and thrombosis of unspecified deep veins of unspecified proximal lower extremity: Secondary | ICD-10-CM | POA: Diagnosis not present

## 2017-11-20 ENCOUNTER — Ambulatory Visit: Payer: Medicare Other | Admitting: "Endocrinology

## 2017-11-23 DIAGNOSIS — I959 Hypotension, unspecified: Secondary | ICD-10-CM | POA: Diagnosis not present

## 2017-11-23 DIAGNOSIS — E1159 Type 2 diabetes mellitus with other circulatory complications: Secondary | ICD-10-CM | POA: Diagnosis not present

## 2017-11-26 LAB — COMPLETE METABOLIC PANEL WITH GFR
AG Ratio: 1.5 (calc) (ref 1.0–2.5)
ALT: 6 U/L (ref 6–29)
AST: 12 U/L (ref 10–35)
Albumin: 3.8 g/dL (ref 3.6–5.1)
Alkaline phosphatase (APISO): 79 U/L (ref 33–130)
BUN: 7 mg/dL (ref 7–25)
CO2: 30 mmol/L (ref 20–32)
Calcium: 8.8 mg/dL (ref 8.6–10.4)
Chloride: 104 mmol/L (ref 98–110)
Creat: 0.66 mg/dL (ref 0.60–0.93)
GFR, Est African American: 102 mL/min/{1.73_m2} (ref >=60)
GFR, Est Non African American: 88 mL/min/{1.73_m2} (ref >=60)
Globulin: 2.5 g/dL (calc) (ref 1.9–3.7)
Glucose, Bld: 85 mg/dL (ref 65–99)
Potassium: 4 mmol/L (ref 3.5–5.3)
Sodium: 140 mmol/L (ref 135–146)
Total Bilirubin: 0.4 mg/dL (ref 0.2–1.2)
Total Protein: 6.3 g/dL (ref 6.1–8.1)

## 2017-11-26 LAB — CORTISOL-AM, BLOOD: Cortisol - AM: 14.7 ug/dL

## 2017-11-26 LAB — HEMOGLOBIN A1C
Hgb A1c MFr Bld: 5.8 % of total Hgb — ABNORMAL HIGH (ref <5.7)
Mean Plasma Glucose: 120 (calc)
eAG (mmol/L): 6.6 (calc)

## 2017-11-28 ENCOUNTER — Ambulatory Visit (INDEPENDENT_AMBULATORY_CARE_PROVIDER_SITE_OTHER): Payer: Medicare Other | Admitting: "Endocrinology

## 2017-11-28 ENCOUNTER — Encounter: Payer: Self-pay | Admitting: "Endocrinology

## 2017-11-28 VITALS — BP 120/65 | HR 68

## 2017-11-28 DIAGNOSIS — E782 Mixed hyperlipidemia: Secondary | ICD-10-CM | POA: Diagnosis not present

## 2017-11-28 DIAGNOSIS — E1159 Type 2 diabetes mellitus with other circulatory complications: Secondary | ICD-10-CM

## 2017-11-28 DIAGNOSIS — I10 Essential (primary) hypertension: Secondary | ICD-10-CM | POA: Diagnosis not present

## 2017-11-28 NOTE — Progress Notes (Signed)
Endocrinology follow-up note   Subjective:    Patient ID: [name], female    DOB: 1945-03-10,    Past Medical History:  Diagnosis Date  . Anxiety   . Arthritis   . Dependence on wheelchair    pivot/transfers  . Depression    History of psychosis and previous suicide attempt  . DVT, lower extremity, recurrent (HCC)    Long-term Coumadin per Dr. Legrand Rams  . Essential hypertension   . GERD (gastroesophageal reflux disease)   . Hemiplegia ([place]) 2010   Left side  . History of stroke    Acute infarct and right cerebral white matter small vessel disease 12/10  . Leg DVT (deep venous thromboembolism), acute ([place]) 2006  . Schizophrenia ([place])   . Stroke [facility])    left sided weakness  . Type 2 diabetes mellitus ([place])    Past Surgical History:  Procedure Laterality Date  . BACK SURGERY    . BIOPSY N/A 11/24/2014   Procedure: BIOPSY;  Surgeon: Danie Binder, MD;  Location: [facility] OR[facility];  Service: Endoscopy;  Laterality: N/A;  . BIOPSY  05/17/2016   Procedure: BIOPSY;  Surgeon: Danie Binder, MD;  Location: [facility] SU[facility];  Service: Endoscopy;;  gastric biopsy  . COLONOSCOPY WITH PROPOFOL N/A 11/24/2014   Dr. Rudie Meyer polyps removed/moderate sized internal hemorrhoids, tubular adenomas. Next surveillance in 3 years  . ESOPHAGOGASTRODUODENOSCOPY (EGD) WITH PROPOFOL N/A 11/24/2014   Dr. Clayburn Pert HH/patent stricture at the gastroesophageal junction, mild non-erosive gastritis, path negative for H.pylori or celiac sprue  . ESOPHAGOGASTRODUODENOSCOPY (EGD) WITH PROPOFOL N/A 05/17/2016   Procedure: ESOPHAGOGASTRODUODENOSCOPY (EGD) WITH PROPOFOL;  Surgeon: Danie Binder, MD;  Location: [facility];  Service: Endoscopy;  Laterality: N/A;  12:45pm  . GIVENS CAPSULE STUDY N/A 12/11/2014   MULTILPLE EROSION IN the stomach WITH ACTIVE OOZING. OCCASIONAL EROSIONS AND RARE ULCER SEEN IN PROXIMAL SMALL BOWEL . No masses or AVMs SEEN. NO OLD BLOOD OR FRESH BLOOD SEEN.   . POLYPECTOMY N/A 11/24/2014    Procedure: POLYPECTOMY;  Surgeon: Danie Binder, MD;  Location: [facility];  Service: Endoscopy;  Laterality: N/A;  . SAVORY DILATION N/A 05/17/2016   Procedure: SAVORY DILATION;  Surgeon: Danie Binder, MD;  Location: [facility];  Service: Endoscopy;  Laterality: N/A;   Social History   Socioeconomic History  . Marital status: Widowed    Spouse name: Not on file  . Number of children: Not on file  . Years of education: Not on file  . Highest education level: Not on file  Occupational History  . Not on file  Social Needs  . Financial resource strain: Not on file  . Food insecurity:    Worry: Not on file    Inability: Not on file  . Transportation needs:    Medical: Not on file    Non-medical: Not on file  Tobacco Use  . Smoking status: Former Smoker    Packs/day: 0.25    Years: 20.00    Pack years: 5.00    Types: Cigarettes    Last attempt to quit: 01/24/1995    Years since quitting: 22.8  . Smokeless tobacco: Never Used  Substance and Sexual Activity  . Alcohol use: No    Alcohol/week: 0.0 standard drinks  . Drug use: No  . Sexual activity: Never    Birth control/protection: Post-menopausal  Lifestyle  . Physical activity:    Days per week: Not on file    Minutes per session: Not  on file  . Stress: Not on file  Relationships  . Social connections:    Talks on phone: Not on file    Gets together: Not on file    Attends religious service: Not on file    Active member of club or organization: Not on file    Attends meetings of clubs or organizations: Not on file    Relationship status: Not on file  Other Topics Concern  . Not on file  Social History Narrative  . Not on file   Outpatient Encounter Medications as of 11/28/2017  Medication Sig  . AMITIZA 8 MCG capsule Take 8 mcg by mouth daily with breakfast.   . ARIPiprazole (ABILIFY) 2 MG tablet Take 1 tablet (2 mg total) by mouth daily.  . calcium carbonate (TUMS - DOSED IN MG ELEMENTAL CALCIUM) 500 MG  chewable tablet Chew 1 tablet by mouth 4 (four) times daily as needed for indigestion or heartburn.   . cholecalciferol (VITAMIN D) 1000 units tablet Take 2,000 Units by mouth daily.  . clonazePAM (KLONOPIN) 0.5 MG tablet Take 1 tablet (0.5 mg total) by mouth 3 (three) times daily.  Marland Kitchen docusate sodium (COLACE) 100 MG capsule Take 200 mg by mouth at bedtime.   . DULoxetine (CYMBALTA) 60 MG capsule Take 120 mg by mouth daily.   Marland Kitchen ENULOSE 10 GM/15ML SOLN Take 20 g by mouth 2 (two) times daily as needed.   Marland Kitchen HYDROcodone-acetaminophen (NORCO/VICODIN) 5-325 MG tablet Take 1 tablet by mouth 3 (three) times daily.  . hydrocortisone (ANUSOL-HC) 2.5 % rectal cream Place 1 application rectally 2 (two) times daily.  . Insulin Aspart (NOVOLOG FLEXPEN [place]) Inject 10-16 Units into the skin 3 (three) times daily before meals.   . iron polysaccharides (NIFEREX) 150 MG capsule Take 1 capsule (150 mg total) by mouth daily.  Marland Kitchen latanoprost (XALATAN) 0.005 % ophthalmic solution Place 1 drop into both eyes at bedtime.  Marland Kitchen LEVEMIR FLEXTOUCH 100 UNIT/ML Pen INJECT 40 UNITS SUBCUTANEOUSLY AT BEDTIME.  Marland Kitchen loperamide (IMODIUM) 2 MG capsule Take 4 mg by mouth daily as needed for diarrhea or loose stools. Take 2 capsules initially then take 2 after each loose stool per MAR  . metoprolol succinate (TOPROL-XL) 25 MG 24 hr tablet Take 0.5 tablets (12.5 mg total) by mouth daily.  . midodrine (PROAMATINE) 2.5 MG tablet Take 2.5 mg by mouth 3 (three) times daily with meals.   . mirtazapine (REMERON) 30 MG tablet Take 30 mg by mouth at bedtime.    Marland Kitchen MOVANTIK 12.5 MG TABS tablet TAKE 1 TABLET BY MOUTH ONCE DAILY AS NEEDED FOR CONSTIPATION.HOLD FORDIARRHEA.  Marland Kitchen omeprazole (PRILOSEC) 40 MG capsule Take 40 mg by mouth daily.  . ondansetron (ZOFRAN) 4 MG tablet Take 1 tablet (4 mg total) by mouth 4 (four) times daily -  before meals and at bedtime.  . polyethylene glycol powder (GLYCOLAX/MIRALAX) powder Take 17 g by mouth daily.  . polyvinyl  alcohol (ARTIFICIAL TEARS) 1.4 % ophthalmic solution Place 1 drop into both eyes 3 (three) times daily.  . risperiDONE (RISPERDAL) 3 MG tablet Take 1 tablet (3 mg total) by mouth daily.  . simvastatin (ZOCOR) 20 MG tablet Take 20 mg by mouth at bedtime.   . solifenacin (VESICARE) 5 MG tablet Take 5 mg by mouth daily.  Marland Kitchen tiZANidine (ZANAFLEX) 2 MG tablet Take 2 mg by mouth daily.  Marland Kitchen warfarin (COUMADIN) 5 MG tablet TAKE 1 TABLET BY MOUTH ONCE DAILY ON SUN.,TUES.,WED.,THURS,SAT .TAKE 1/2 TABLET (2.5mg ) ON  MON., & FRIDAY.  Marland Kitchen zolpidem (AMBIEN) 10 MG tablet Take 1 tablet (10 mg total) by mouth at bedtime.   No facility-administered encounter medications on file as of 11/28/2017.    ALLERGIES: Allergies  Allergen Reactions  . Sulfa Antibiotics Rash  . Sulfonamide Derivatives Rash    REACTION: rash   VACCINATION STATUS: There is no immunization history for the selected administration types on file for this patient.  Diabetes  She presents for her follow-up diabetic visit. She has type 2 diabetes mellitus. Onset time: She was diagnosed at approximate age of 46 years. Her disease course has been improving. There are no hypoglycemic associated symptoms. Pertinent negatives for hypoglycemia include no confusion, pallor or seizures. Pertinent negatives for diabetes include no polydipsia, no polyphagia and no polyuria. There are no hypoglycemic complications. Symptoms are improving. Diabetic complications include a CVA. Risk factors for coronary artery disease include dyslipidemia, diabetes mellitus, obesity, sedentary lifestyle and hypertension. Current diabetic treatment includes intensive insulin program and oral agent (monotherapy) (She kept requiring large dose of insulin due to consumption of large quantities of processed carbohydrates.Marland Kitchen). Her weight is fluctuating minimally. She is following a generally unhealthy diet. When asked about meal planning, she reported none. She never participates in  exercise. Her home blood glucose trend is decreasing steadily. Her breakfast blood glucose range is generally 130-140 mg/dl. Her lunch blood glucose range is generally 130-140 mg/dl. Her dinner blood glucose range is generally 130-140 mg/dl. Her bedtime blood glucose range is generally 130-140 mg/dl. Her overall blood glucose range is 130-140 mg/dl. An ACE inhibitor/angiotensin II receptor blocker is being taken.  Hypertension  This is a chronic problem. The current episode started more than 1 year ago. The problem is controlled. Pertinent negatives include no palpitations or shortness of breath. Risk factors for coronary artery disease include dyslipidemia and diabetes mellitus. Hypertensive end-organ damage includes CVA.  Hyperlipidemia  This is a chronic problem. The current episode started more than 1 year ago. Exacerbating diseases include diabetes. Pertinent negatives include no shortness of breath. Risk factors for coronary artery disease include diabetes mellitus, dyslipidemia, hypertension, obesity and a sedentary lifestyle.    Review of Systems  Constitutional: Negative for unexpected weight change.       Uses walker to get around.   HENT: Negative for trouble swallowing and voice change.   Eyes: Negative for visual disturbance.  Respiratory: Negative for shortness of breath and wheezing.   Cardiovascular: Negative for palpitations and leg swelling.  Gastrointestinal: Negative for diarrhea.  Endocrine: Negative for cold intolerance, heat intolerance, polydipsia, polyphagia and polyuria.  Skin: Negative for color change, pallor and wound.  Neurological: Negative for seizures.  Psychiatric/Behavioral: Negative for confusion and suicidal ideas.    Objective:    BP 120/65   Pulse 68   Wt Readings from Last 3 Encounters:  07/06/17 151 lb (68.5 kg)  06/27/17 138 lb (62.6 kg)  03/28/17 154 lb 5.2 oz (70 kg)    Physical Exam  Constitutional: She is oriented to person, place, and  time.  She was found to be lethargic this morning with a series of low blood pressure readings 83/41, 89/40, and 70/38 with pulse rate of 77.  HENT:  Head: Normocephalic and atraumatic.  She is a mouth breather, has dry mucous membranes.  Eyes: EOM are normal.  Neck: Normal range of motion. Neck supple. No tracheal deviation present. No thyromegaly present.  Cardiovascular: Normal rate and regular rhythm.  Pulmonary/Chest: Effort normal.  Abdominal: There is no  tenderness. There is no guarding.  Musculoskeletal: Normal range of motion. She exhibits no edema.  Neurological: She is oriented to person, place, and time.  She is oriented to time, place, and person.  She is on a wheelchair.    Skin: Skin is warm and dry. No rash noted. No erythema. No pallor.  Psychiatric: She has a normal mood and affect. Judgment normal.    Results for orders placed or performed in visit on 11/12/17  POCT INR  Result Value Ref Range   INR 3.9 (A) 2.0 - 3.0   Complete Blood Count (Most recent): Lab Results  Component Value Date   WBC 6.8 06/27/2017   HGB 13.8 06/27/2017   HCT 40.8 06/27/2017   MCV 88.9 06/27/2017   PLT 250 06/27/2017   Chemistry (most recent): Lab Results  Component Value Date   NA 140 11/23/2017   K 4.0 11/23/2017   CL 104 11/23/2017   CO2 30 11/23/2017   BUN 7 11/23/2017   CREATININE 0.66 11/23/2017   Diabetic Labs (most recent): Lab Results  Component Value Date   HGBA1C 5.8 (H) 11/23/2017   HGBA1C 6.5 (H) 03/20/2017   HGBA1C 6.6 (H) 12/18/2016   Lipid Panel     Component Value Date/Time   CHOL 133 10/07/2016 0620   TRIG 252 (H) 10/07/2016 0620   HDL 28 (L) 10/07/2016 0620   CHOLHDL 4.8 10/07/2016 0620   VLDL 50 (H) 10/07/2016 0620   LDLCALC 55 10/07/2016 0620     Assessment & Plan:   1. Type 2 diabetes mellitus with vascular disease ([place]) Her diabetes is  complicated by recurrent CVA. She returns with her logs showing controlled glycemia both fasting and  postprandial after her recent adjustment of her insulin doses.  Her previsit labs are consistent with A1c of 5.8%, improving from 8.2%.    - Patient remains at a high risk for more acute and chronic complications of diabetes which include CAD, CVA, CKD, retinopathy, and neuropathy. These are all discussed in detail with the patient.  - I have re-counseled the patient on diet management and weight loss  by adopting a carbohydrate restricted / protein rich  Diet.  - Patient is advised to stick to a routine mealtimes to eat 3 meals  a day and avoid unnecessary snacks ( to snack only to correct hypoglycemia).  - I have approached patient with the following individualized plan to manage diabetes and patient agrees.   - I advised her nursing home to decrease Levemir to 20 units nightly, decrease NovoLog to 5 -11 units 3 times daily AC for pre-meal blood glucose above 90 mg/dL, associated with strict monitoring of blood glucose 4 times a day- daily before meals and at bedtime. -Adjustment parameters for hypo and hyperglycemia were given in a written document to patient. -Patient is encouraged to call clinic for blood glucose levels less than 70 or above 300 mg /dl.  - she did not tolerate Metformin. - Patient specific target  for A1c; LDL, HDL, Triglycerides, and  Waist Circumference were discussed in detail.  2) BP/HTN: Her blood pressure is controlled without medications at this time.     3) Lipids/HPL: Her recent lipid panel from September 2018 showed LDL of 55.  She is advised to continue her current simvastatin 20 mg p.o. nightly.    4)  Weight/Diet:  exercise, and carbohydrates information provided.  5) Chronic Care/Health Maintenance:  - I have advised 1on 1 assistance at mealtimes and deligent hydration .  Patient needs to keep her appointment with Ophthalmology, Podiatrist at least yearly or according to recommendations, and advised to  stay away from smoking. I have recommended yearly  flu vaccine and pneumonia vaccination at least every 5 years; and  sleep for at least 7 hours a day.  I advised patient to maintain close follow up with her PCP for primary care needs.  - Time spent with the patient: 15 min, of which >50% was spent in reviewing her blood glucose logs , discussing her hypo- and hyper-glycemic episodes, reviewing her current and  previous labs and insulin doses and developing a plan to avoid hypo- and hyper-glycemia. Please refer to Patient Instructions for Blood Glucose Monitoring and Insulin/Medications Dosing Guide"  in media tab for additional information. [name] participated in the discussions, expressed understanding, and voiced agreement with the above plans.  All questions were answered to her satisfaction. she is encouraged to contact clinic should she have any questions or concerns prior to her return visit.   Follow up plan: No follow-ups on file.  Glade Lloyd, MD Phone: [phone]  Fax: [phone]   This note was partially dictated with voice recognition software. Similar sounding words can be transcribed inadequately or may not  be corrected upon review.  11/28/2017, 2:38 PM

## 2017-11-30 ENCOUNTER — Other Ambulatory Visit: Payer: Self-pay | Admitting: Cardiology

## 2017-12-03 ENCOUNTER — Ambulatory Visit (INDEPENDENT_AMBULATORY_CARE_PROVIDER_SITE_OTHER): Payer: Medicare Other | Admitting: *Deleted

## 2017-12-03 DIAGNOSIS — I6930 Unspecified sequelae of cerebral infarction: Secondary | ICD-10-CM

## 2017-12-03 DIAGNOSIS — Z8679 Personal history of other diseases of the circulatory system: Secondary | ICD-10-CM

## 2017-12-03 DIAGNOSIS — Z5181 Encounter for therapeutic drug level monitoring: Secondary | ICD-10-CM | POA: Diagnosis not present

## 2017-12-03 LAB — POCT INR: INR: 3.9 — AB (ref 2.0–3.0)

## 2017-12-03 MED ORDER — WARFARIN SODIUM 5 MG PO TABS: ORAL_TABLET | ORAL | 6 refills | Status: DC

## 2017-12-03 NOTE — Patient Instructions (Signed)
Hold coumadin tonight then decrease dose to 1/2 tablet daily except 1 tablet on Tuesdays, Thursdays and Saturdays Recheck in 3 weeks

## 2017-12-10 ENCOUNTER — Telehealth (HOSPITAL_COMMUNITY): Payer: Self-pay | Admitting: *Deleted

## 2017-12-10 ENCOUNTER — Other Ambulatory Visit (HOSPITAL_COMMUNITY): Payer: Self-pay | Admitting: Psychiatry

## 2017-12-10 MED ORDER — ARIPIPRAZOLE 2 MG PO TABS: 2.0000 mg | ORAL_TABLET | Freq: Every day | ORAL | 0 refills | Status: DC

## 2017-12-10 NOTE — Telephone Encounter (Signed)
Dr Fonnie Jarvis (pharmacist) [facility] called requesting refills on Abilify , Gabapentin & Risperdone

## 2017-12-10 NOTE — Telephone Encounter (Signed)
Ordered Abilify. Risperidone- ordered 90 days on 10/21, so she should have enough. Gabapentin- this medication needs to come from her PCP. I have not ordered this medication before.

## 2017-12-12 DIAGNOSIS — H25813 Combined forms of age-related cataract, bilateral: Secondary | ICD-10-CM | POA: Diagnosis not present

## 2017-12-12 DIAGNOSIS — Z7984 Long term (current) use of oral hypoglycemic drugs: Secondary | ICD-10-CM | POA: Diagnosis not present

## 2017-12-12 DIAGNOSIS — E119 Type 2 diabetes mellitus without complications: Secondary | ICD-10-CM | POA: Diagnosis not present

## 2017-12-12 DIAGNOSIS — Z794 Long term (current) use of insulin: Secondary | ICD-10-CM | POA: Diagnosis not present

## 2017-12-15 DIAGNOSIS — I10 Essential (primary) hypertension: Secondary | ICD-10-CM | POA: Diagnosis not present

## 2017-12-15 DIAGNOSIS — E1165 Type 2 diabetes mellitus with hyperglycemia: Secondary | ICD-10-CM | POA: Diagnosis not present

## 2017-12-18 NOTE — Progress Notes (Signed)
[facility] MD/PA/NP OP Progress Note  12/24/2017 9:32 AM [name]  MRN:  [id number]  Chief Complaint:  Chief Complaint    Follow-up; Schizophrenia; Depression     HPI:  Patient presents for follow-up appointment for depression and schizophrenia by history.  She states that she is doing well.  She gets along with her roommate.  She visited her daughter on holiday; there were 2 of her great-grandchildren.  She enjoyed the time was them.  She joins bingo, Engineer, site and crafts group at [employer], although she states that she does not have any hobby.  She feels less anxious.  She denies feeling depressed.  She sleeps better. She reports better appetite. She has fair concentration.  She denies SI.  She denies panic attacks. She denies HI, AH, VH. She denies paranoia, ideas of reference. She complains of back pain. Of note, she is not aware of any past diagnosis of schizophrenia. She denies being admitted to psychiatry hospital. She repetitively asks the plan for her medication (although she was initially resistant to decrease risperidone, she later agreed with it.)  Staff at [facility] presents to the interview.  This staff is not covering the unit where the patient resides. Although the staff does not know the details, she is not aware of any concern about the patient.    Wt Readings from Last 3 Encounters:  07/06/17 151 lb (68.5 kg)  06/27/17 138 lb (62.6 kg)  03/28/17 154 lb 5.2 oz (70 kg)    Per PMP,  Clonazepam filled twice since 11/12/2017  Ambien filled on 11/30/2017   Visit Diagnosis:    ICD-10-CM   1. Schizophrenia, unspecified type ([place]) F20.9     Past Psychiatric History: Please see initial evaluation for full details. I have reviewed the history. No updates at this time.     Past Medical History:  Past Medical History:  Diagnosis Date  . Anxiety   . Arthritis   . Dependence on wheelchair    pivot/transfers  . Depression    History of psychosis and previous suicide attempt   . DVT, lower extremity, recurrent (HCC)    Long-term Coumadin per Dr. Legrand Rams  . Essential hypertension   . GERD (gastroesophageal reflux disease)   . Hemiplegia ([place]) 2010   Left side  . History of stroke    Acute infarct and right cerebral white matter small vessel disease 12/10  . Leg DVT (deep venous thromboembolism), acute ([place]) 2006  . Schizophrenia ([place])   . Stroke [facility])    left sided weakness  . Type 2 diabetes mellitus ([place])     Past Surgical History:  Procedure Laterality Date  . BACK SURGERY    . BIOPSY N/A 11/24/2014   Procedure: BIOPSY;  Surgeon: Danie Binder, MD;  Location: [facility] ORS;  Service: Endoscopy;  Laterality: N/A;  . BIOPSY  05/17/2016   Procedure: BIOPSY;  Surgeon: Danie Binder, MD;  Location: [facility] ENDO SUITE;  Service: Endoscopy;;  gastric biopsy  . COLONOSCOPY WITH PROPOFOL N/A 11/24/2014   Dr. Rudie Meyer polyps removed/moderate sized internal hemorrhoids, tubular adenomas. Next surveillance in 3 years  . ESOPHAGOGASTRODUODENOSCOPY (EGD) WITH PROPOFOL N/A 11/24/2014   Dr. Clayburn Pert HH/patent stricture at the gastroesophageal junction, mild non-erosive gastritis, path negative for H.pylori or celiac sprue  . ESOPHAGOGASTRODUODENOSCOPY (EGD) WITH PROPOFOL N/A 05/17/2016   Procedure: ESOPHAGOGASTRODUODENOSCOPY (EGD) WITH PROPOFOL;  Surgeon: Danie Binder, MD;  Location: [facility];  Service: Endoscopy;  Laterality: N/A;  12:45pm  . GIVENS  CAPSULE STUDY N/A 12/11/2014   MULTILPLE EROSION IN the stomach WITH ACTIVE OOZING. OCCASIONAL EROSIONS AND RARE ULCER SEEN IN PROXIMAL SMALL BOWEL . No masses or AVMs SEEN. NO OLD BLOOD OR FRESH BLOOD SEEN.   . POLYPECTOMY N/A 11/24/2014   Procedure: POLYPECTOMY;  Surgeon: Danie Binder, MD;  Location: [facility]S;  Service: Endoscopy;  Laterality: N/A;  . SAVORY DILATION N/A 05/17/2016   Procedure: SAVORY DILATION;  Surgeon: Danie Binder, MD;  Location: [facility] SU[facility];  Service: Endoscopy;  Laterality: N/A;    Family Psychiatric  History: Please see initial evaluation for full details. I have reviewed the history. No updates at this time.     Family History:  Family History  Problem Relation Age of Onset  . Hypertension Mother   . Colon cancer Neg Hx     Social History:  Social History   Socioeconomic History  . Marital status: Widowed    Spouse name: Not on file  . Number of children: Not on file  . Years of education: Not on file  . Highest education level: Not on file  Occupational History  . Not on file  Social Needs  . Financial resource strain: Not on file  . Food insecurity:    Worry: Not on file    Inability: Not on file  . Transportation needs:    Medical: Not on file    Non-medical: Not on file  Tobacco Use  . Smoking status: Former Smoker    Packs/day: 0.25    Years: 20.00    Pack years: 5.00    Types: Cigarettes    Last attempt to quit: 01/24/1995    Years since quitting: 22.9  . Smokeless tobacco: Never Used  Substance and Sexual Activity  . Alcohol use: No    Alcohol/week: 0.0 standard drinks  . Drug use: No  . Sexual activity: Never    Birth control/protection: Post-menopausal  Lifestyle  . Physical activity:    Days per week: Not on file    Minutes per session: Not on file  . Stress: Not on file  Relationships  . Social connections:    Talks on phone: Not on file    Gets together: Not on file    Attends religious service: Not on file    Active member of club or organization: Not on file    Attends meetings of clubs or organizations: Not on file    Relationship status: Not on file  Other Topics Concern  . Not on file  Social History Narrative  . Not on file    Allergies:  Allergies  Allergen Reactions  . Sulfa Antibiotics Rash  . Sulfonamide Derivatives Rash    REACTION: rash    Metabolic Disorder Labs: Lab Results  Component Value Date   HGBA1C 5.8 (H) 11/23/2017   MPG 120 11/23/2017   MPG 140 03/20/2017   No results found for: PROLACTIN Lab Results   Component Value Date   CHOL 133 10/07/2016   TRIG 252 (H) 10/07/2016   HDL 28 (L) 10/07/2016   CHOLHDL 4.8 10/07/2016   VLDL 50 (H) 10/07/2016   LDLCALC 55 10/07/2016   LDLCALC (H) 01/03/2009    143        Total Cholesterol/HDL:CHD Risk Coronary Heart Disease Risk Table                     Men   Women  1/2 Average Risk   3.4   3.3  Average Risk       5.0   4.4  2 X Average Risk   9.6   7.1  3 X Average Risk  23.4   11.0        Use the calculated Patient Ratio above and the CHD Risk Table to determine the patient's CHD Risk.        ATP III CLASSIFICATION (LDL):  <100     mg/dL   Optimal  100-129  mg/dL   Near or Above                    Optimal  130-159  mg/dL   Borderline  160-189  mg/dL   High  >190     mg/dL   Very High   Lab Results  Component Value Date   TSH 1.138 03/29/2017   TSH 0.538 02/13/2016    Therapeutic Level Labs: No results found for: LITHIUM No results found for: VALPROATE No components found for:  CBMZ  Current Medications: Current Outpatient Medications  Medication Sig Dispense Refill  . AMITIZA 8 MCG capsule Take 8 mcg by mouth daily with breakfast.     . [START ON 03/10/2018] ARIPiprazole (ABILIFY) 2 MG tablet Take 1 tablet (2 mg total) by mouth daily. 90 tablet 0  . calcium carbonate (TUMS - DOSED IN MG ELEMENTAL CALCIUM) 500 MG chewable tablet Chew 1 tablet by mouth 4 (four) times daily as needed for indigestion or heartburn.     . cholecalciferol (VITAMIN D) 1000 units tablet Take 2,000 Units by mouth daily.    Derrill Memo ON 01/11/2018] clonazePAM (KLONOPIN) 0.5 MG tablet Take 1 tablet (0.5 mg total) by mouth 3 (three) times daily. 90 tablet 2  . docusate sodium (COLACE) 100 MG capsule Take 200 mg by mouth at bedtime.     . DULoxetine (CYMBALTA) 60 MG capsule Take 120 mg by mouth daily.     Marland Kitchen ENULOSE 10 GM/15ML SOLN Take 20 g by mouth 2 (two) times daily as needed.     Marland Kitchen HYDROcodone-acetaminophen (NORCO/VICODIN) 5-325 MG tablet Take 1 tablet  by mouth 3 (three) times daily. 30 tablet 0  . hydrocortisone (ANUSOL-HC) 2.5 % rectal cream Place 1 application rectally 2 (two) times daily.    . Insulin Aspart (NOVOLOG FLEXPEN [place]) Inject 10-16 Units into the skin 3 (three) times daily before meals.     . iron polysaccharides (NIFEREX) 150 MG capsule Take 1 capsule (150 mg total) by mouth daily. 30 capsule 11  . latanoprost (XALATAN) 0.005 % ophthalmic solution Place 1 drop into both eyes at bedtime.    Marland Kitchen LEVEMIR FLEXTOUCH 100 UNIT/ML Pen INJECT 40 UNITS SUBCUTANEOUSLY AT BEDTIME. 15 mL 3  . loperamide (IMODIUM) 2 MG capsule Take 4 mg by mouth daily as needed for diarrhea or loose stools. Take 2 capsules initially then take 2 after each loose stool per MAR    . metoprolol succinate (TOPROL-XL) 25 MG 24 hr tablet Take 0.5 tablets (12.5 mg total) by mouth daily.    . midodrine (PROAMATINE) 2.5 MG tablet Take 2.5 mg by mouth 3 (three) times daily with meals.     . mirtazapine (REMERON) 30 MG tablet Take 30 mg by mouth at bedtime.      Marland Kitchen MOVANTIK 12.5 MG TABS tablet TAKE 1 TABLET BY MOUTH ONCE DAILY AS NEEDED FOR CONSTIPATION.HOLD FORDIARRHEA. 30 tablet 5  . omeprazole (PRILOSEC) 40 MG capsule Take 40 mg by mouth daily.    . ondansetron (ZOFRAN)  4 MG tablet Take 1 tablet (4 mg total) by mouth 4 (four) times daily -  before meals and at bedtime. 120 tablet 3  . polyethylene glycol powder (GLYCOLAX/MIRALAX) powder Take 17 g by mouth daily. 255 g 0  . polyvinyl alcohol (ARTIFICIAL TEARS) 1.4 % ophthalmic solution Place 1 drop into both eyes 3 (three) times daily.    . risperiDONE (RISPERDAL) 2 MG tablet Take 1 tablet (2 mg total) by mouth daily. 90 tablet 0  . simvastatin (ZOCOR) 20 MG tablet Take 20 mg by mouth at bedtime.     . solifenacin (VESICARE) 5 MG tablet Take 5 mg by mouth daily.    Marland Kitchen tiZANidine (ZANAFLEX) 2 MG tablet Take 2 mg by mouth daily.    Marland Kitchen warfarin (COUMADIN) 5 MG tablet Hold coumadin tonight then decrease dose to 1/2 tablet daily  except 1 tablet on Tuesdays, Thursdays and Saturdays 26 tablet 6  . [START ON 12/30/2017] zolpidem (AMBIEN) 10 MG tablet Take 1 tablet (10 mg total) by mouth at bedtime. 30 tablet 2   No current facility-administered medications for this visit.      Musculoskeletal: Strength & Muscle Tone: within normal limits Gait & Station: in wheelchair Patient leans: N/A  Psychiatric Specialty Exam: Review of Systems  Psychiatric/Behavioral: Negative for depression, hallucinations, memory loss, substance abuse and suicidal ideas. The patient is nervous/anxious and has insomnia.   All other systems reviewed and are negative.   Blood pressure 106/66, pulse 91, height 5\' 5"  (1.651 m), SpO2 97 %.Body mass index is 25.13 kg/m.  General Appearance: Fairly Groomed  Eye Contact:  Good  Speech:  Clear and Coherent  Volume:  Normal  Mood:  "good"  Affect:  Appropriate, Congruent and Restricted  Thought Process:  Coherent  Orientation:  Full (Time, Place, and Person)  Thought Content: Logical no paranoia  Suicidal Thoughts:  No  Homicidal Thoughts:  No  Memory:  Immediate;   Good  Judgement:  Good  Insight:  Fair  Psychomotor Activity:  Normal  Concentration:  Concentration: Good and Attention Span: Good  Recall:  Good  Fund of Knowledge: Good  Language: Good  Akathisia:  No  Handed:  Right  AIMS (if indicated): not done  Assets:  Communication Skills Desire for Improvement  ADL's:  Intact  Cognition: WNL  Sleep:  Good   Screenings: PHQ2-9     Office Visit from 11/28/2017 in [facility] Office Visit from 09/25/2017 in [facility] Office Visit from 03/28/2017 in [facility] Office Visit from 09/13/2016 in [facility] Office Visit from 06/07/2016 in [facility]  PHQ-2 Total Score  0  0  0  0  0     09/19/2017 Mini cog 1/3 (wrote numbers inside the right side of circle, place  hands pointing 12 and 2.) Delayed recall 1/3  Unable to correctly copy pentagon   Assessment and Plan:  [name] is a 72 y.o. year old female with a history of schizophrenia, SVT, DVT, history of stroke on warfarin, type II diabetes, hypertension, GERD , who presents for follow up appointment for Schizophrenia, unspecified type ([place])  # Schizophrenia by history # MDD Patient continues to demonstrate linear thought process, although she does not elaborate the history unless prompted; which is consistent with the initial evaluation.  Will continue duloxetine, mirtazapine to target depression and anxiety.  Will continue Abilify as adjunctive treatment for depression and also to target schizophrenia by history.  Will taper down  Risperdal slowly to avoid polypharmacy.  Will continue clonazepam as needed for anxiety; discussed risk of dependence and oversedation.  Will continue Ambien at this time for insomnia; will plan to taper it off in the future.  Although she does have a charted history of schizophrenia, she denies any significant psychotic episode.  Will obtain records from her prior psychiatrist.   # Cognitive deficits Patient is oriented on today's evaluation, although she does have some cognitive deficits, characterized by mini cog.  Will continue to monitor.   Plan I have reviewed and updated plans as below 1. Continue duloxetine 120 mg daily 2. Continue Mirtazapine 30 mg at night 3. Continue Abilfy 2 mg daily 4. Decrease risperidone 2 mg at night  5. Zolpidem 10 mg at night  6. Continue clonazepam 0.5 mg three times a day for anxiety 7. Return to clinic in three moths for 30 mins 8. Obtain record from Dr. Camie Patience  The patient demonstrates the following risk factors for suicide: Chronic risk factors for suicide include: psychiatric disorder of schizophrenia by history, depression. Acute risk factors for suicide include: unemployment. Protective factors for this patient  include: positive social support and hope for the future. Considering these factors, the overall suicide risk at this point appears to be low. Patient is appropriate for outpatient follow up.  The duration of this appointment visit was 30 minutes of face-to-face time with the patient.  Greater than 50% of this time was spent in counseling, explanation of  diagnosis, planning of further management, and coordination of care.   [name], MD 12/24/2017, 9:32 AM

## 2017-12-24 ENCOUNTER — Encounter (HOSPITAL_COMMUNITY): Payer: Self-pay | Admitting: Psychiatry

## 2017-12-24 ENCOUNTER — Ambulatory Visit (INDEPENDENT_AMBULATORY_CARE_PROVIDER_SITE_OTHER): Payer: Medicare Other | Admitting: *Deleted

## 2017-12-24 ENCOUNTER — Ambulatory Visit (INDEPENDENT_AMBULATORY_CARE_PROVIDER_SITE_OTHER): Payer: Medicare Other | Admitting: Psychiatry

## 2017-12-24 VITALS — BP 106/66 | HR 91 | Ht 65.0 in

## 2017-12-24 DIAGNOSIS — F209 Schizophrenia, unspecified: Secondary | ICD-10-CM | POA: Diagnosis not present

## 2017-12-24 DIAGNOSIS — Z8679 Personal history of other diseases of the circulatory system: Secondary | ICD-10-CM

## 2017-12-24 DIAGNOSIS — Z5181 Encounter for therapeutic drug level monitoring: Secondary | ICD-10-CM

## 2017-12-24 DIAGNOSIS — I82409 Acute embolism and thrombosis of unspecified deep veins of unspecified lower extremity: Secondary | ICD-10-CM

## 2017-12-24 LAB — POCT INR: INR: 1.8 — AB (ref 2.0–3.0)

## 2017-12-24 MED ORDER — CLONAZEPAM 0.5 MG PO TABS: 0.5000 mg | ORAL_TABLET | Freq: Three times a day (TID) | ORAL | 2 refills | Status: DC

## 2017-12-24 MED ORDER — WARFARIN SODIUM 5 MG PO TABS: ORAL_TABLET | ORAL | 6 refills | Status: DC

## 2017-12-24 MED ORDER — ZOLPIDEM TARTRATE 10 MG PO TABS: 10.0000 mg | ORAL_TABLET | Freq: Every day | ORAL | 2 refills | Status: DC

## 2017-12-24 MED ORDER — RISPERIDONE 2 MG PO TABS: 2.0000 mg | ORAL_TABLET | Freq: Every day | ORAL | 0 refills | Status: DC

## 2017-12-24 MED ORDER — ARIPIPRAZOLE 2 MG PO TABS: 2.0000 mg | ORAL_TABLET | Freq: Every day | ORAL | 0 refills | Status: DC

## 2017-12-24 NOTE — Patient Instructions (Signed)
1. Continue duloxetine 120 mg daily 2. Continue Mirtazapine 30 mg at night 3. Continue Abilfy 2 mg daily 4. Decrease risperidone 2 mg at night  5. Zolpidem 10 mg at night  6. Continue clonazepam 0.5 mg three times a day for anxiety 7. Return to clinic in three moths for 30 mins 8. Obtain record from Dr. Camie Patience

## 2017-12-24 NOTE — Patient Instructions (Signed)
Take coumadin 1 tablet tonight then increase dose to 1 tablet daily except 1/2 tablet on Mondays, Wednesdays and Fridays Recheck in 3 weeks

## 2017-12-25 DIAGNOSIS — M79675 Pain in left toe(s): Secondary | ICD-10-CM | POA: Diagnosis not present

## 2017-12-25 DIAGNOSIS — M79674 Pain in right toe(s): Secondary | ICD-10-CM | POA: Diagnosis not present

## 2017-12-25 DIAGNOSIS — B351 Tinea unguium: Secondary | ICD-10-CM | POA: Diagnosis not present

## 2017-12-27 DIAGNOSIS — F333 Major depressive disorder, recurrent, severe with psychotic symptoms: Secondary | ICD-10-CM | POA: Diagnosis not present

## 2017-12-27 DIAGNOSIS — I10 Essential (primary) hypertension: Secondary | ICD-10-CM | POA: Diagnosis not present

## 2017-12-27 DIAGNOSIS — E1165 Type 2 diabetes mellitus with hyperglycemia: Secondary | ICD-10-CM | POA: Diagnosis not present

## 2017-12-27 DIAGNOSIS — Z1331 Encounter for screening for depression: Secondary | ICD-10-CM | POA: Diagnosis not present

## 2017-12-27 DIAGNOSIS — I825Y9 Chronic embolism and thrombosis of unspecified deep veins of unspecified proximal lower extremity: Secondary | ICD-10-CM | POA: Diagnosis not present

## 2017-12-27 DIAGNOSIS — G8190 Hemiplegia, unspecified affecting unspecified side: Secondary | ICD-10-CM | POA: Diagnosis not present

## 2017-12-27 DIAGNOSIS — Z1389 Encounter for screening for other disorder: Secondary | ICD-10-CM | POA: Diagnosis not present

## 2017-12-31 DIAGNOSIS — I69354 Hemiplegia and hemiparesis following cerebral infarction affecting left non-dominant side: Secondary | ICD-10-CM | POA: Diagnosis not present

## 2017-12-31 DIAGNOSIS — G894 Chronic pain syndrome: Secondary | ICD-10-CM | POA: Diagnosis not present

## 2017-12-31 DIAGNOSIS — M545 Low back pain: Secondary | ICD-10-CM | POA: Diagnosis not present

## 2017-12-31 DIAGNOSIS — Z79891 Long term (current) use of opiate analgesic: Secondary | ICD-10-CM | POA: Diagnosis not present

## 2017-12-31 DIAGNOSIS — M961 Postlaminectomy syndrome, not elsewhere classified: Secondary | ICD-10-CM | POA: Diagnosis not present

## 2017-12-31 DIAGNOSIS — I671 Cerebral aneurysm, nonruptured: Secondary | ICD-10-CM | POA: Diagnosis not present

## 2018-01-09 ENCOUNTER — Ambulatory Visit (INDEPENDENT_AMBULATORY_CARE_PROVIDER_SITE_OTHER): Payer: Medicare Other | Admitting: *Deleted

## 2018-01-09 DIAGNOSIS — Z8679 Personal history of other diseases of the circulatory system: Secondary | ICD-10-CM | POA: Diagnosis not present

## 2018-01-09 DIAGNOSIS — Z5181 Encounter for therapeutic drug level monitoring: Secondary | ICD-10-CM

## 2018-01-09 DIAGNOSIS — I82409 Acute embolism and thrombosis of unspecified deep veins of unspecified lower extremity: Secondary | ICD-10-CM | POA: Diagnosis not present

## 2018-01-09 LAB — POCT INR: INR: 2.3 (ref 2.0–3.0)

## 2018-01-09 NOTE — Patient Instructions (Signed)
Continue coumadin 1 tablet daily except 1/2 tablet on Mondays, Wednesdays and Fridays Recheck in 4 weeks 

## 2018-01-11 ENCOUNTER — Other Ambulatory Visit: Payer: Self-pay | Admitting: "Endocrinology

## 2018-01-21 ENCOUNTER — Ambulatory Visit (INDEPENDENT_AMBULATORY_CARE_PROVIDER_SITE_OTHER): Payer: Medicare Other | Admitting: Nurse Practitioner

## 2018-01-21 ENCOUNTER — Encounter: Payer: Self-pay | Admitting: Nurse Practitioner

## 2018-01-21 VITALS — BP 64/40 | HR 72 | Temp 96.9°F | Ht 65.0 in | Wt 137.6 lb

## 2018-01-21 DIAGNOSIS — R197 Diarrhea, unspecified: Secondary | ICD-10-CM

## 2018-01-21 DIAGNOSIS — K582 Mixed irritable bowel syndrome: Secondary | ICD-10-CM

## 2018-01-21 NOTE — Patient Instructions (Signed)
1. Continue taking Amitiza 8 mcg and Colace 200 mg, every day. 2. Change MiraLAX to 1 dose, as needed for worsening constipation. 3. Continue Imodium as needed for diarrhea. 4. Return for follow-up in 6 months. 5. Call if any questions or concerns.  At [facility] we value your feedback. You may receive a survey about your visit today. Please share your experience as we strive to create trusting relationships with our patients to provide genuine, compassionate, quality care.  We appreciate your understanding and patience as we review any laboratory studies, imaging, and other diagnostic tests that are ordered as we care for you. Our office policy is 5 business days for review of these results, and any emergent or urgent results are addressed in a timely manner for your best interest. If you do not hear from our office in 1 week, please contact us.   We also encourage the use of MyChart, which contains your medical information for your review as well. If you are not enrolled in this feature, an access code is on this after visit summary for your convenience. Thank you for allowing [place] to be involved in your care.  It was great to see you today!  I hope you have a Happy New Year!!

## 2018-01-21 NOTE — Assessment & Plan Note (Signed)
Intermittent diarrhea in the setting of IBS predominantly constipation type.  I feel we are overshooting the target with 3 constipation medications.  I will have him change MiraLAX to as needed.  Continue Imodium as needed and follow-up in 6 months.  No other red flag/warning signs or symptoms.

## 2018-01-21 NOTE — Assessment & Plan Note (Signed)
Chronic history of IBS, seems to be predominantly constipation.  She is on significant amounts of laxatives including Amitiza 8 mcg daily, Colace 200 mg daily, MiraLAX 17 g daily.  She is subsequently having at least soft stools, occasionally loose stools/diarrhea.  At this point I feel we may be overshooting the target.  I will have them continue Amitiza and Colace.  Change MiraLAX to as needed.  Continue Imodium as needed, as per below.  Return for follow-up in 6 months.  Call if any problems before then.

## 2018-01-21 NOTE — Progress Notes (Signed)
Referring Provider: [name], MD Primary Care Physician:  [name], MD Primary GI:  Dr. Oneida Alar  Chief Complaint  Patient presents with  . Diarrhea    HPI:   [name] is a 72 y.o. female who presents for follow-up on IBS diarrhea type.  She was last seen in our office 07/20/2017 for the same as well as non-intractable nausea and vomiting.  Noted history of IDA.  Colonoscopy/EGD/capsule study completed November 2016 and felt to be related to erosive gastropathy and enteropathy with next due colonoscopy in 2019.  Noted right lobe liver hemangioma on imaging.  A repeat EGD in 2018 found benign-appearing esophageal stenosis, small hiatal hernia, chronic gastritis found to be negative for H. pylori.  Historically symptoms akin to IBS mixed type with diarrhea about every 2 weeks and last for several days and abdominal pain that improves after a bowel movement.  Imodium does help her diarrhea.  Recommended Movantik with constipation and Imodium with diarrhea.  At her last visit she was accompanied by facility staff, noted "nervous stomach" with some morning time nausea but no vomiting.  Constipation doing well, occasional diarrhea.  No other GI symptoms.  Recommended continue current medication regimen, Imodium "as needed" for when she has diarrhea, follow-up in 6 months.  Today she is accompanied by.  Today she states she's having persistent diarrhea. States they do not give her anything. Facility staff is not Med Tech so she's not sure. Denies abdominal pain, N/V. Occasional fecal urgency and incontinence. Has a bowel movement about 3 times a day. Stools are always real soft, sometimes diarrhea. Denies chest pain, dyspnea, dizziness, lightheadedness, syncope, near syncope. Denies any other upper or lower GI symptoms.  Past Medical History:  Diagnosis Date  . Anxiety   . Arthritis   . Dependence on wheelchair    pivot/transfers  . Depression    History of psychosis and  previous suicide attempt  . DVT, lower extremity, recurrent (HCC)    Long-term Coumadin per Dr. Legrand Rams  . Essential hypertension   . GERD (gastroesophageal reflux disease)   . Hemiplegia ([place]) 2010   Left side  . History of stroke    Acute infarct and right cerebral white matter small vessel disease 12/10  . Leg DVT (deep venous thromboembolism), acute ([place]) 2006  . Schizophrenia ([place])   . Stroke [facility])    left sided weakness  . Type 2 diabetes mellitus ([place])     Past Surgical History:  Procedure Laterality Date  . BACK SURGERY    . BIOPSY N/A 11/24/2014   Procedure: BIOPSY;  Surgeon: Danie Binder, MD;  Location: [facility];  Service: Endoscopy;  Laterality: N/A;  . BIOPSY  05/17/2016   Procedure: BIOPSY;  Surgeon: Danie Binder, MD;  Location: [facility];  Service: Endoscopy;;  gastric biopsy  . COLONOSCOPY WITH PROPOFOL N/A 11/24/2014   Dr. Rudie Meyer polyps removed/moderate sized internal hemorrhoids, tubular adenomas. Next surveillance in 3 years  . ESOPHAGOGASTRODUODENOSCOPY (EGD) WITH PROPOFOL N/A 11/24/2014   Dr. Clayburn Pert HH/patent stricture at the gastroesophageal junction, mild non-erosive gastritis, path negative for H.pylori or celiac sprue  . ESOPHAGOGASTRODUODENOSCOPY (EGD) WITH PROPOFOL N/A 05/17/2016   Procedure: ESOPHAGOGASTRODUODENOSCOPY (EGD) WITH PROPOFOL;  Surgeon: Danie Binder, MD;  Location: [facility];  Service: Endoscopy;  Laterality: N/A;  12:45pm  . GIVENS CAPSULE STUDY N/A 12/11/2014   MULTILPLE EROSION IN the stomach WITH ACTIVE OOZING. OCCASIONAL EROSIONS AND RARE ULCER SEEN IN PROXIMAL SMALL BOWEL .  No masses or AVMs SEEN. NO OLD BLOOD OR FRESH BLOOD SEEN.   . POLYPECTOMY N/A 11/24/2014   Procedure: POLYPECTOMY;  Surgeon: Danie Binder, MD;  Location: [facility];  Service: Endoscopy;  Laterality: N/A;  . SAVORY DILATION N/A 05/17/2016   Procedure: SAVORY DILATION;  Surgeon: Danie Binder, MD;  Location: [facility];  Service: Endoscopy;  Laterality:  N/A;    Current Outpatient Medications  Medication Sig Dispense Refill  . acetaminophen (TYLENOL) 325 MG tablet Take 650 mg by mouth 3 (three) times daily as needed.    . AMITIZA 8 MCG capsule Take 8 mcg by mouth daily with breakfast.     . [START ON 03/10/2018] ARIPiprazole (ABILIFY) 2 MG tablet Take 1 tablet (2 mg total) by mouth daily. 90 tablet 0  . calcium carbonate (TUMS - DOSED IN MG ELEMENTAL CALCIUM) 500 MG chewable tablet Chew 1 tablet by mouth 4 (four) times daily as needed for indigestion or heartburn.     . cholecalciferol (VITAMIN D) 1000 units tablet Take 2,000 Units by mouth daily.    . clonazePAM (KLONOPIN) 0.5 MG tablet Take 1 tablet (0.5 mg total) by mouth 3 (three) times daily. 90 tablet 2  . docusate sodium (COLACE) 100 MG capsule Take 200 mg by mouth at bedtime.     . DULoxetine (CYMBALTA) 60 MG capsule Take 120 mg by mouth daily.     Marland Kitchen ENULOSE 10 GM/15ML SOLN Take 20 g by mouth 2 (two) times daily as needed.     . fluticasone (FLONASE) 50 MCG/ACT nasal spray Place into both nostrils as needed for allergies or rhinitis.    Marland Kitchen gabapentin (NEURONTIN) 300 MG capsule Take 300 mg by mouth 3 (three) times daily.    . hydrocortisone (ANUSOL-HC) 2.5 % rectal cream Place 1 application rectally 2 (two) times daily.    . Insulin Aspart (NOVOLOG FLEXPEN [place]) Inject 10-16 Units into the skin 3 (three) times daily before meals.     . iron polysaccharides (NIFEREX) 150 MG capsule Take 1 capsule (150 mg total) by mouth daily. 30 capsule 11  . latanoprost (XALATAN) 0.005 % ophthalmic solution Place 1 drop into both eyes at bedtime.    Marland Kitchen LEVEMIR FLEXTOUCH 100 UNIT/ML Pen INJECT 40 UNITS SUBCUTANEOUSLY AT BEDTIME. (Patient taking differently: Inject 20 Units into the skin at bedtime. ) 15 mL 3  . loperamide (IMODIUM) 2 MG capsule Take 2 mg by mouth. Take 1 capsule by mouth after each loose stool    . metoprolol succinate (TOPROL-XL) 25 MG 24 hr tablet Take 0.5 tablets (12.5 mg total) by mouth  daily. (Patient taking differently: Take 12.5 mg by mouth 2 (two) times daily. )    . midodrine (PROAMATINE) 2.5 MG tablet Take 2.5 mg by mouth 3 (three) times daily with meals.     . mirabegron ER (MYRBETRIQ) 25 MG TB24 tablet Take 25 mg by mouth daily.    . mirtazapine (REMERON) 30 MG tablet Take 30 mg by mouth at bedtime.      Marland Kitchen MOVANTIK 12.5 MG TABS tablet TAKE 1 TABLET BY MOUTH ONCE DAILY AS NEEDED FOR CONSTIPATION.HOLD FORDIARRHEA. 30 tablet 5  . NOVOLOG FLEXPEN 100 UNIT/ML FlexPen INJECT 5 UNITS SUBCUTANEOUSLY BEFORE MEALS IF BS IS BETWEEN 90-150. 15 mL 2  . NOVOLOG FLEXPEN 100 UNIT/ML FlexPen SS: 151-200=1 UNIT;201-250=2 UNITS;251-300 ADD 3 UNITS; 301-350 ADD 4UNITS; 351-400 ADD 5 UNITS; 401-450 ADD 6 UNITS AND CALL DOCTOR. UNITS 15 mL 2  . omeprazole (PRILOSEC) 40  MG capsule Take 40 mg by mouth daily.    . ondansetron (ZOFRAN) 4 MG tablet Take 1 tablet (4 mg total) by mouth 4 (four) times daily -  before meals and at bedtime. (Patient taking differently: Take 4 mg by mouth as needed. ) 120 tablet 3  . polyethylene glycol powder (GLYCOLAX/MIRALAX) powder Take 17 g by mouth daily. 255 g 0  . polyvinyl alcohol (ARTIFICIAL TEARS) 1.4 % ophthalmic solution Place 1 drop into both eyes 3 (three) times daily.    . risperiDONE (RISPERDAL) 2 MG tablet Take 1 tablet (2 mg total) by mouth daily. 90 tablet 0  . simvastatin (ZOCOR) 20 MG tablet Take 20 mg by mouth at bedtime.     Marland Kitchen tiZANidine (ZANAFLEX) 2 MG tablet Take 2 mg by mouth daily.    Marland Kitchen warfarin (COUMADIN) 5 MG tablet Take coumadin 5mg  tonight then increase dose to 5mg  daily except 2.5mg  on Mondays, Wednesdays and Fridays 26 tablet 6  . zolpidem (AMBIEN) 10 MG tablet Take 1 tablet (10 mg total) by mouth at bedtime. 30 tablet 2  . solifenacin (VESICARE) 5 MG tablet Take 5 mg by mouth daily.     No current facility-administered medications for this visit.     Allergies as of 01/21/2018 - Review Complete 01/21/2018  Allergen Reaction Noted  .  Sulfa antibiotics Rash 02/23/2010  . Sulfonamide derivatives Rash     Family History  Problem Relation Age of Onset  . Hypertension Mother   . Colon cancer Neg Hx     Social History   Socioeconomic History  . Marital status: Widowed    Spouse name: Not on file  . Number of children: Not on file  . Years of education: Not on file  . Highest education level: Not on file  Occupational History  . Not on file  Social Needs  . Financial resource strain: Not on file  . Food insecurity:    Worry: Not on file    Inability: Not on file  . Transportation needs:    Medical: Not on file    Non-medical: Not on file  Tobacco Use  . Smoking status: Former Smoker    Packs/day: 0.25    Years: 20.00    Pack years: 5.00    Types: Cigarettes    Last attempt to quit: 01/24/1995    Years since quitting: 23.0  . Smokeless tobacco: Never Used  Substance and Sexual Activity  . Alcohol use: No    Alcohol/week: 0.0 standard drinks  . Drug use: No  . Sexual activity: Never    Birth control/protection: Post-menopausal  Lifestyle  . Physical activity:    Days per week: Not on file    Minutes per session: Not on file  . Stress: Not on file  Relationships  . Social connections:    Talks on phone: Not on file    Gets together: Not on file    Attends religious service: Not on file    Active member of club or organization: Not on file    Attends meetings of clubs or organizations: Not on file    Relationship status: Not on file  Other Topics Concern  . Not on file  Social History Narrative  . Not on file    Review of Systems: Complete ROS negative except as per HPI.   Physical Exam: BP (!) 64/40   Pulse 72   Temp (!) 96.9 F (36.1 C) (Oral)   Ht 5\' 5"  (1.651 m)  Wt 137 lb 9.6 oz (62.4 kg)   BMI 22.90 kg/m  General:   Alert and oriented. Pleasant and cooperative. Well-nourished and well-developed.  Eyes:  Without icterus, sclera clear and conjunctiva pink.  Ears:  Normal  auditory acuity. Cardiovascular:  S1, S2 present without murmurs appreciated. Extremities without clubbing or edema. Respiratory:  Clear to auscultation bilaterally. No wheezes, rales, or rhonchi. No distress.  Gastrointestinal:  +BS, soft, non-tender and non-distended. No HSM noted. No guarding or rebound. No masses appreciated.  Rectal:  Deferred  Musculoskalatal:  Symmetrical without gross deformities. Neurologic:  Alert and oriented x4;  grossly normal neurologically. Psych:  Alert and cooperative. Normal mood and affect. Heme/Lymph/Immune: No excessive bruising noted.    01/21/2018 10:38 AM   Disclaimer: This note was dictated with voice recognition software. Similar sounding words can inadvertently be transcribed and may not be corrected upon review.

## 2018-01-21 NOTE — Progress Notes (Signed)
CC'D TO PCP °

## 2018-01-22 ENCOUNTER — Encounter: Payer: Self-pay | Admitting: *Deleted

## 2018-01-30 DIAGNOSIS — I671 Cerebral aneurysm, nonruptured: Secondary | ICD-10-CM | POA: Diagnosis not present

## 2018-01-30 DIAGNOSIS — I69354 Hemiplegia and hemiparesis following cerebral infarction affecting left non-dominant side: Secondary | ICD-10-CM | POA: Diagnosis not present

## 2018-01-30 DIAGNOSIS — M545 Low back pain: Secondary | ICD-10-CM | POA: Diagnosis not present

## 2018-01-30 DIAGNOSIS — M961 Postlaminectomy syndrome, not elsewhere classified: Secondary | ICD-10-CM | POA: Diagnosis not present

## 2018-02-01 ENCOUNTER — Other Ambulatory Visit: Payer: Self-pay | Admitting: "Endocrinology

## 2018-02-12 DIAGNOSIS — M25569 Pain in unspecified knee: Secondary | ICD-10-CM | POA: Diagnosis not present

## 2018-02-12 DIAGNOSIS — I639 Cerebral infarction, unspecified: Secondary | ICD-10-CM | POA: Diagnosis not present

## 2018-02-12 DIAGNOSIS — M545 Low back pain: Secondary | ICD-10-CM | POA: Diagnosis not present

## 2018-02-13 ENCOUNTER — Ambulatory Visit (INDEPENDENT_AMBULATORY_CARE_PROVIDER_SITE_OTHER): Payer: Medicare Other | Admitting: Pharmacist

## 2018-02-13 DIAGNOSIS — Z5181 Encounter for therapeutic drug level monitoring: Secondary | ICD-10-CM | POA: Diagnosis not present

## 2018-02-13 DIAGNOSIS — I82409 Acute embolism and thrombosis of unspecified deep veins of unspecified lower extremity: Secondary | ICD-10-CM | POA: Diagnosis not present

## 2018-02-13 DIAGNOSIS — Z8679 Personal history of other diseases of the circulatory system: Secondary | ICD-10-CM

## 2018-02-13 LAB — POCT INR: INR: 3 (ref 2.0–3.0)

## 2018-02-13 NOTE — Patient Instructions (Signed)
Description   Continue coumadin 1 tablet daily except 1/2 tablet on Mondays, Wednesdays and Fridays. Recheck in 4 weeks.      

## 2018-02-24 DIAGNOSIS — E1165 Type 2 diabetes mellitus with hyperglycemia: Secondary | ICD-10-CM | POA: Diagnosis not present

## 2018-02-24 DIAGNOSIS — I10 Essential (primary) hypertension: Secondary | ICD-10-CM | POA: Diagnosis not present

## 2018-02-26 DIAGNOSIS — I671 Cerebral aneurysm, nonruptured: Secondary | ICD-10-CM | POA: Diagnosis not present

## 2018-02-26 DIAGNOSIS — M545 Low back pain: Secondary | ICD-10-CM | POA: Diagnosis not present

## 2018-02-26 DIAGNOSIS — I69354 Hemiplegia and hemiparesis following cerebral infarction affecting left non-dominant side: Secondary | ICD-10-CM | POA: Diagnosis not present

## 2018-02-26 DIAGNOSIS — M25569 Pain in unspecified knee: Secondary | ICD-10-CM | POA: Diagnosis not present

## 2018-02-26 DIAGNOSIS — M79674 Pain in right toe(s): Secondary | ICD-10-CM | POA: Diagnosis not present

## 2018-02-26 DIAGNOSIS — B351 Tinea unguium: Secondary | ICD-10-CM | POA: Diagnosis not present

## 2018-02-26 DIAGNOSIS — M961 Postlaminectomy syndrome, not elsewhere classified: Secondary | ICD-10-CM | POA: Diagnosis not present

## 2018-02-26 DIAGNOSIS — M79675 Pain in left toe(s): Secondary | ICD-10-CM | POA: Diagnosis not present

## 2018-02-26 DIAGNOSIS — I639 Cerebral infarction, unspecified: Secondary | ICD-10-CM | POA: Diagnosis not present

## 2018-02-28 ENCOUNTER — Ambulatory Visit: Payer: Medicare Other | Admitting: "Endocrinology

## 2018-02-28 ENCOUNTER — Other Ambulatory Visit: Payer: Self-pay | Admitting: "Endocrinology

## 2018-02-28 DIAGNOSIS — M545 Low back pain: Secondary | ICD-10-CM | POA: Diagnosis not present

## 2018-02-28 DIAGNOSIS — I639 Cerebral infarction, unspecified: Secondary | ICD-10-CM | POA: Diagnosis not present

## 2018-02-28 DIAGNOSIS — M25569 Pain in unspecified knee: Secondary | ICD-10-CM | POA: Diagnosis not present

## 2018-02-28 DIAGNOSIS — E1159 Type 2 diabetes mellitus with other circulatory complications: Secondary | ICD-10-CM

## 2018-02-28 DIAGNOSIS — I10 Essential (primary) hypertension: Secondary | ICD-10-CM

## 2018-03-05 DIAGNOSIS — M25569 Pain in unspecified knee: Secondary | ICD-10-CM | POA: Diagnosis not present

## 2018-03-05 DIAGNOSIS — M545 Low back pain: Secondary | ICD-10-CM | POA: Diagnosis not present

## 2018-03-05 DIAGNOSIS — I639 Cerebral infarction, unspecified: Secondary | ICD-10-CM | POA: Diagnosis not present

## 2018-03-07 DIAGNOSIS — M545 Low back pain: Secondary | ICD-10-CM | POA: Diagnosis not present

## 2018-03-07 DIAGNOSIS — M25569 Pain in unspecified knee: Secondary | ICD-10-CM | POA: Diagnosis not present

## 2018-03-07 DIAGNOSIS — I639 Cerebral infarction, unspecified: Secondary | ICD-10-CM | POA: Diagnosis not present

## 2018-03-11 ENCOUNTER — Other Ambulatory Visit (HOSPITAL_COMMUNITY): Payer: Self-pay | Admitting: Psychiatry

## 2018-03-11 MED ORDER — RISPERIDONE 2 MG PO TABS: 2.0000 mg | ORAL_TABLET | Freq: Every day | ORAL | 0 refills | Status: DC

## 2018-03-12 DIAGNOSIS — M25569 Pain in unspecified knee: Secondary | ICD-10-CM | POA: Diagnosis not present

## 2018-03-12 DIAGNOSIS — M545 Low back pain: Secondary | ICD-10-CM | POA: Diagnosis not present

## 2018-03-12 DIAGNOSIS — I639 Cerebral infarction, unspecified: Secondary | ICD-10-CM | POA: Diagnosis not present

## 2018-03-13 ENCOUNTER — Ambulatory Visit (INDEPENDENT_AMBULATORY_CARE_PROVIDER_SITE_OTHER): Payer: Medicare Other | Admitting: *Deleted

## 2018-03-13 DIAGNOSIS — I82409 Acute embolism and thrombosis of unspecified deep veins of unspecified lower extremity: Secondary | ICD-10-CM | POA: Diagnosis not present

## 2018-03-13 DIAGNOSIS — Z5181 Encounter for therapeutic drug level monitoring: Secondary | ICD-10-CM | POA: Diagnosis not present

## 2018-03-13 DIAGNOSIS — I6930 Unspecified sequelae of cerebral infarction: Secondary | ICD-10-CM

## 2018-03-13 DIAGNOSIS — Z8679 Personal history of other diseases of the circulatory system: Secondary | ICD-10-CM | POA: Diagnosis not present

## 2018-03-13 LAB — POCT INR: INR: 3.7 — AB (ref 2.0–3.0)

## 2018-03-13 NOTE — Patient Instructions (Signed)
Hold coumadin tonight then resume 1 tablet daily except 1/2 tablet on Mondays, Wednesdays and Fridays Recheck in 4 weeks

## 2018-03-14 DIAGNOSIS — M25569 Pain in unspecified knee: Secondary | ICD-10-CM | POA: Diagnosis not present

## 2018-03-14 DIAGNOSIS — M545 Low back pain: Secondary | ICD-10-CM | POA: Diagnosis not present

## 2018-03-14 DIAGNOSIS — I639 Cerebral infarction, unspecified: Secondary | ICD-10-CM | POA: Diagnosis not present

## 2018-03-15 DIAGNOSIS — E1159 Type 2 diabetes mellitus with other circulatory complications: Secondary | ICD-10-CM | POA: Diagnosis not present

## 2018-03-16 LAB — COMPLETE METABOLIC PANEL WITHOUT GFR
AG Ratio: 1.3 (calc) (ref 1.0–2.5)
ALT: 4 U/L — ABNORMAL LOW (ref 6–29)
AST: 11 U/L (ref 10–35)
Albumin: 3.7 g/dL (ref 3.6–5.1)
Alkaline phosphatase (APISO): 87 U/L (ref 37–153)
BUN/Creatinine Ratio: 12 (calc) (ref 6–22)
BUN: 7 mg/dL (ref 7–25)
CO2: 32 mmol/L (ref 20–32)
Calcium: 8.8 mg/dL (ref 8.6–10.4)
Chloride: 100 mmol/L (ref 98–110)
Creat: 0.59 mg/dL — ABNORMAL LOW (ref 0.60–0.93)
GFR, Est African American: 106 mL/min/1.73m2 (ref >=60)
GFR, Est Non African American: 92 mL/min/1.73m2 (ref >=60)
Globulin: 2.8 g/dL (ref 1.9–3.7)
Glucose, Bld: 127 mg/dL — ABNORMAL HIGH (ref 65–99)
Potassium: 3.9 mmol/L (ref 3.5–5.3)
Sodium: 140 mmol/L (ref 135–146)
Total Bilirubin: 0.3 mg/dL (ref 0.2–1.2)
Total Protein: 6.5 g/dL (ref 6.1–8.1)

## 2018-03-16 LAB — HEMOGLOBIN A1C
Hgb A1c MFr Bld: 6.3 %{Hb} — ABNORMAL HIGH (ref <5.7)
Mean Plasma Glucose: 134 (calc)
eAG (mmol/L): 7.4 (calc)

## 2018-03-18 NOTE — Progress Notes (Signed)
[facility] MD/PA/NP OP Progress Note  03/25/2018 9:07 AM [name]  MRN:  [id number]  Chief Complaint:  Chief Complaint    Follow-up; Other     HPI:  Patient presents for follow-up appointment for cognitive impairment and depression.  According to the staff, clonazepam was reduced from 3 times daily to twice daily.  No other medication changes.   She states that she feels fatigue.  She had good holidays when she was asked.  She feels depressed at times.  She sleeps well.  She denies appetite loss.  She denies SI.  She denies anxiety or hallucinations.  She feels slightly dizzy today.  She complains of back pain.  She denies nausea or vomiting, fever.   The staff presents to the interview.  The patient appears to have decreased p.o. intake.  The patient tends to stay in the room most of the time, although she may come out of the room for morning activity.  Although the patient may appear to be upset when she has to wait for medication to be administered, she does not have any significant behavioral issues. She has occasional insomnia, and sleeps during the day at times. She gets along well with her roommate, who is out of the room most of the time. They checked BP monthly. The staff agrees to discuss with supervisor regarding her blood pressure today.   Clonazepam filled by Dr. Merlene Laughter on 03/08/2018    Visit Diagnosis:    ICD-10-CM   1. Schizophrenia, unspecified type ([place]) F20.9 CBC    Comprehensive Metabolic Panel (CMET)    zolpidem (AMBIEN) 10 MG tablet    Past Psychiatric History: Please see initial evaluation for full details. I have reviewed the history. No updates at this time.     Past Medical History:  Past Medical History:  Diagnosis Date  . Anxiety   . Arthritis   . Dependence on wheelchair    pivot/transfers  . Depression    History of psychosis and previous suicide attempt  . DVT, lower extremity, recurrent (HCC)    Long-term Coumadin per Dr. Legrand Rams  . Essential  hypertension   . GERD (gastroesophageal reflux disease)   . Hemiplegia ([place]) 2010   Left side  . History of stroke    Acute infarct and right cerebral white matter small vessel disease 12/10  . Leg DVT (deep venous thromboembolism), acute ([place]) 2006  . Schizophrenia ([place])   . Stroke [facility])    left sided weakness  . Type 2 diabetes mellitus ([place])     Past Surgical History:  Procedure Laterality Date  . BACK SURGERY    . BIOPSY N/A 11/24/2014   Procedure: BIOPSY;  Surgeon: Danie Binder, MD;  Location: [facility];  Service: Endoscopy;  Laterality: N/A;  . BIOPSY  05/17/2016   Procedure: BIOPSY;  Surgeon: Danie Binder, MD;  Location: [facility];  Service: Endoscopy;;  gastric biopsy  . COLONOSCOPY WITH PROPOFOL N/A 11/24/2014   Dr. Rudie Meyer polyps removed/moderate sized internal hemorrhoids, tubular adenomas. Next surveillance in 3 years  . ESOPHAGOGASTRODUODENOSCOPY (EGD) WITH PROPOFOL N/A 11/24/2014   Dr. Clayburn Pert HH/patent stricture at the gastroesophageal junction, mild non-erosive gastritis, path negative for H.pylori or celiac sprue  . ESOPHAGOGASTRODUODENOSCOPY (EGD) WITH PROPOFOL N/A 05/17/2016   Procedure: ESOPHAGOGASTRODUODENOSCOPY (EGD) WITH PROPOFOL;  Surgeon: Danie Binder, MD;  Location: [facility];  Service: Endoscopy;  Laterality: N/A;  12:45pm  . GIVENS CAPSULE STUDY N/A 12/11/2014   MULTILPLE EROSION IN the stomach  WITH ACTIVE OOZING. OCCASIONAL EROSIONS AND RARE ULCER SEEN IN PROXIMAL SMALL BOWEL . No masses or AVMs SEEN. NO OLD BLOOD OR FRESH BLOOD SEEN.   . POLYPECTOMY N/A 11/24/2014   Procedure: POLYPECTOMY;  Surgeon: Danie Binder, MD;  Location: [facility];  Service: Endoscopy;  Laterality: N/A;  . SAVORY DILATION N/A 05/17/2016   Procedure: SAVORY DILATION;  Surgeon: Danie Binder, MD;  Location: [facility];  Service: Endoscopy;  Laterality: N/A;    Family Psychiatric History: Please see initial evaluation for full details. I have reviewed the history. No  updates at this time.     Family History:  Family History  Problem Relation Age of Onset  . Hypertension Mother   . Colon cancer Neg Hx     Social History:  Social History   Socioeconomic History  . Marital status: Widowed    Spouse name: Not on file  . Number of children: Not on file  . Years of education: Not on file  . Highest education level: Not on file  Occupational History  . Not on file  Social Needs  . Financial resource strain: Not on file  . Food insecurity:    Worry: Not on file    Inability: Not on file  . Transportation needs:    Medical: Not on file    Non-medical: Not on file  Tobacco Use  . Smoking status: Former Smoker    Packs/day: 0.25    Years: 20.00    Pack years: 5.00    Types: Cigarettes    Last attempt to quit: 01/24/1995    Years since quitting: 23.1  . Smokeless tobacco: Never Used  Substance and Sexual Activity  . Alcohol use: No    Alcohol/week: 0.0 standard drinks  . Drug use: No  . Sexual activity: Never    Birth control/protection: Post-menopausal  Lifestyle  . Physical activity:    Days per week: Not on file    Minutes per session: Not on file  . Stress: Not on file  Relationships  . Social connections:    Talks on phone: Not on file    Gets together: Not on file    Attends religious service: Not on file    Active member of club or organization: Not on file    Attends meetings of clubs or organizations: Not on file    Relationship status: Not on file  Other Topics Concern  . Not on file  Social History Narrative  . Not on file    Allergies:  Allergies  Allergen Reactions  . Sulfa Antibiotics Rash  . Sulfonamide Derivatives Rash    REACTION: rash    Metabolic Disorder Labs: Lab Results  Component Value Date   HGBA1C 6.3 (H) 03/15/2018   MPG 134 03/15/2018   MPG 120 11/23/2017   No results found for: PROLACTIN Lab Results  Component Value Date   CHOL 133 10/07/2016   TRIG 252 (H) 10/07/2016   HDL 28 (L)  10/07/2016   CHOLHDL 4.8 10/07/2016   VLDL 50 (H) 10/07/2016   LDLCALC 55 10/07/2016   LDLCALC (H) 01/03/2009    143        Total Cholesterol/HDL:CHD Risk Coronary Heart Disease Risk Table                     Men   Women  1/2 Average Risk   3.4   3.3  Average Risk       5.0  4.4  2 X Average Risk   9.6   7.1  3 X Average Risk  23.4   11.0        Use the calculated Patient Ratio above and the CHD Risk Table to determine the patient's CHD Risk.        ATP III CLASSIFICATION (LDL):  <100     mg/dL   Optimal  100-129  mg/dL   Near or Above                    Optimal  130-159  mg/dL   Borderline  160-189  mg/dL   High  >190     mg/dL   Very High   Lab Results  Component Value Date   TSH 1.138 03/29/2017   TSH 0.538 02/13/2016    Therapeutic Level Labs: No results found for: LITHIUM No results found for: VALPROATE No components found for:  CBMZ  Current Medications: Current Outpatient Medications  Medication Sig Dispense Refill  . AMITIZA 8 MCG capsule Take 8 mcg by mouth daily with breakfast.     . ARIPiprazole (ABILIFY) 2 MG tablet Take 1 tablet (2 mg total) by mouth daily. 90 tablet 0  . clonazePAM (KLONOPIN) 0.5 MG tablet Take 1 tablet (0.5 mg total) by mouth 3 (three) times daily. 90 tablet 2  . docusate sodium (COLACE) 100 MG capsule Take 200 mg by mouth at bedtime.     . DULoxetine (CYMBALTA) 60 MG capsule Take 120 mg by mouth daily.     Marland Kitchen gabapentin (NEURONTIN) 300 MG capsule Take 300 mg by mouth 3 (three) times daily.    . iron polysaccharides (NIFEREX) 150 MG capsule Take 1 capsule (150 mg total) by mouth daily. 30 capsule 11  . latanoprost (XALATAN) 0.005 % ophthalmic solution Place 1 drop into both eyes at bedtime.    Marland Kitchen LEVEMIR FLEXTOUCH 100 UNIT/ML Pen INJECT 20 UNITS SUBCUTANEOUSLY AT BEDTIME. 15 mL 2  . metoprolol succinate (TOPROL-XL) 25 MG 24 hr tablet Take 0.5 tablets (12.5 mg total) by mouth daily. (Patient taking differently: Take 12.5 mg by mouth 2  (two) times daily. )    . midodrine (PROAMATINE) 2.5 MG tablet Take 2.5 mg by mouth 3 (three) times daily with meals.     . mirabegron ER (MYRBETRIQ) 25 MG TB24 tablet Take 25 mg by mouth daily.    . mirtazapine (REMERON) 30 MG tablet Take 30 mg by mouth at bedtime.      Marland Kitchen omeprazole (PRILOSEC) 40 MG capsule Take 40 mg by mouth daily.    . polyvinyl alcohol (ARTIFICIAL TEARS) 1.4 % ophthalmic solution Place 1 drop into both eyes 3 (three) times daily.    . simvastatin (ZOCOR) 20 MG tablet Take 20 mg by mouth at bedtime.     Marland Kitchen tiZANidine (ZANAFLEX) 2 MG tablet Take 2 mg by mouth daily.    Derrill Memo ON 04/04/2018] zolpidem (AMBIEN) 10 MG tablet Take 1 tablet (10 mg total) by mouth at bedtime. Please hold this medication if she has drowsiness 30 tablet 2  . acetaminophen (TYLENOL) 325 MG tablet Take 650 mg by mouth 3 (three) times daily as needed.    . calcium carbonate (TUMS - DOSED IN MG ELEMENTAL CALCIUM) 500 MG chewable tablet Chew 1 tablet by mouth 4 (four) times daily as needed for indigestion or heartburn.     . cholecalciferol (VITAMIN D) 1000 units tablet Take 2,000 Units by mouth daily.    Marland Kitchen ENULOSE 10  GM/15ML SOLN Take 20 g by mouth 2 (two) times daily as needed.     . fluticasone (FLONASE) 50 MCG/ACT nasal spray Place into both nostrils as needed for allergies or rhinitis.    Marland Kitchen HYDROcodone-acetaminophen (NORCO) 10-325 MG tablet     . HYDROcodone-acetaminophen (NORCO/VICODIN) 5-325 MG tablet 2 (two) times daily.    . hydrocortisone (ANUSOL-HC) 2.5 % rectal cream Place 1 application rectally 2 (two) times daily.    Marland Kitchen loperamide (IMODIUM) 2 MG capsule Take 2 mg by mouth. Take 1 capsule by mouth after each loose stool    . MOVANTIK 12.5 MG TABS tablet TAKE 1 TABLET BY MOUTH ONCE DAILY AS NEEDED FOR CONSTIPATION.HOLD FORDIARRHEA. 30 tablet 5  . ondansetron (ZOFRAN) 4 MG tablet Take 1 tablet (4 mg total) by mouth 4 (four) times daily -  before meals and at bedtime. (Patient taking differently:  Take 4 mg by mouth as needed. ) 120 tablet 3  . polyethylene glycol powder (GLYCOLAX/MIRALAX) powder Take 17 g by mouth daily. 255 g 0  . solifenacin (VESICARE) 5 MG tablet Take 5 mg by mouth daily.    Marland Kitchen warfarin (COUMADIN) 5 MG tablet Take coumadin 5mg  tonight then increase dose to 5mg  daily except 2.5mg  on Mondays, Wednesdays and Fridays 26 tablet 6   No current facility-administered medications for this visit.      Musculoskeletal: Strength & Muscle Tone: within normal limits Gait & Station: in wheel chair Patient leans: N/A  Psychiatric Specialty Exam: Review of Systems  Neurological: Positive for dizziness.  Psychiatric/Behavioral: Positive for depression and memory loss. Negative for hallucinations, substance abuse and suicidal ideas. The patient has insomnia. The patient is not nervous/anxious.   All other systems reviewed and are negative.   Blood pressure (!) 66/39, pulse 72.There is no height or weight on file to calculate BMI. in wheel chair, not checked body weight to avoid risk of fall  General Appearance: Fairly Groomed  Eye Contact:  Good  Speech:  Clear and Coherent  Volume:  Normal  Mood:  "tired"  Affect:  Appropriate, Congruent and fatigue  Thought Process:  Coherent  Orientation:  Full (Time, Place, and Person)  Thought Content: Logical   Suicidal Thoughts:  No  Homicidal Thoughts:  No  Memory:  Immediate;   Good  Judgement:  Good  Insight:  Fair  Psychomotor Activity:  Normal  Concentration:  Concentration: Good and Attention Span: Good  Recall:  Good  Fund of Knowledge: Good  Language: Good  Akathisia:  No  Handed:  Right  AIMS (if indicated): no tremors, no rigidity  Assets:  Desire for Improvement Social Support  ADL's:  Intact  Cognition: WNL  Sleep:  Fair   Screenings: PHQ2-9     Office Visit from 11/28/2017 in [facility] Office Visit from 09/25/2017 in [facility] Office Visit from 03/28/2017 in  [facility] Office Visit from 09/13/2016 in [facility] Office Visit from 06/07/2016 in [facility]  PHQ-2 Total Score  0  0  0  0  0       Assessment and Plan:  [name] is a 73 y.o. year old female with a history of schizophrenia,  SVT, DVT, history of stroke on warfarin, type II diabetes, hypertension, GERD, who presents for follow up appointment for Schizophrenia, unspecified type ([place]) - Plan: CBC, Comprehensive Metabolic Panel (CMET), zolpidem (AMBIEN) 10 MG tablet  # Schizophrenia by history # MDD Although she does not ambulate the  history unless prompted, patient continues to demonstrate linear thought process, which is consistent since the initial evaluation.  Will continue duloxetine and mirtazapine to target depression and anxiety.  Will continue Abilify as adjunctive treatment for depression.  Noted that although she does have history of schizophrenia, she denies any hallucinations or history consistent with schizophrenia.  Will discontinue Risperdal to avoid polypharmacy, and also to avoid its side effect of orthostatic hypotension.  Will continue clonazepam as needed for anxiety; discussed risk of oversedation.  Will continue Ambien as needed for insomnia; will plan to taper it off in the future.   # cognitive deficits She is oriented on today's evaluation, although she does have some cognitive deficits, characterized by mini cog.  Will continue to monitor.   # Hypotension She has marked hypotension today; the staff is strongly advised to contact the patient PCP today, otherwise be evaluated at ED. noted that she denies any physical symptoms except fatigue; will obtain labs.   Plan I have reviewed and updated plans as below 1. Continue duloxetine 120 mg daily 2. Continue Mirtazapine 30 mg at night 3. Continue Abilify 2 mg daily 4. Discontinue risperidone  5. Zolpidem 10 mg at night  6. Continue  clonazepam 0.5 mg twice a day for anxiety (tapered down from TID) 7. Return to clinic in three moths for 30 mins 8. Obtain record from Dr. Peggye Form- pending - obtain CBC, CMP  The patient demonstrates the following risk factors for suicide: Chronic risk factors for suicide include:psychiatric disorder ofschizophrenia by history, depression. Acute risk factorsfor suicide include: unemployment. Protective factorsfor this patient include: positive social support and hope for the future. Considering these factors, the overall suicide risk at this point appears to below. Patientisappropriate for outpatient follow up.  The duration of this appointment visit was 25 minutes of face-to-face time with the patient.  Greater than 50% of this time was spent in counseling, explanation of  diagnosis, planning of further management, and coordination of care.  [name], MD 03/25/2018, 9:07 AM

## 2018-03-21 ENCOUNTER — Encounter: Payer: Self-pay | Admitting: "Endocrinology

## 2018-03-21 ENCOUNTER — Ambulatory Visit (INDEPENDENT_AMBULATORY_CARE_PROVIDER_SITE_OTHER): Payer: Medicare Other | Admitting: "Endocrinology

## 2018-03-21 VITALS — BP 113/67 | HR 95

## 2018-03-21 DIAGNOSIS — E1159 Type 2 diabetes mellitus with other circulatory complications: Secondary | ICD-10-CM | POA: Diagnosis not present

## 2018-03-21 DIAGNOSIS — I959 Hypotension, unspecified: Secondary | ICD-10-CM | POA: Insufficient documentation

## 2018-03-21 DIAGNOSIS — E782 Mixed hyperlipidemia: Secondary | ICD-10-CM | POA: Diagnosis not present

## 2018-03-21 NOTE — Patient Instructions (Signed)
Nursing home must send patient with blood glucose readings and insulin administration records.

## 2018-03-21 NOTE — Progress Notes (Signed)
Endocrinology follow-up note   Subjective:    Patient ID: [name], female    DOB: 1945/05/18,    Past Medical History:  Diagnosis Date  . Anxiety   . Arthritis   . Dependence on wheelchair    pivot/transfers  . Depression    History of psychosis and previous suicide attempt  . DVT, lower extremity, recurrent (HCC)    Long-term Coumadin per Dr. Legrand Rams  . Essential hypertension   . GERD (gastroesophageal reflux disease)   . Hemiplegia ([place]) 2010   Left side  . History of stroke    Acute infarct and right cerebral white matter small vessel disease 12/10  . Leg DVT (deep venous thromboembolism), acute ([place]) 2006  . Schizophrenia ([place])   . Stroke [facility])    left sided weakness  . Type 2 diabetes mellitus ([place])    Past Surgical History:  Procedure Laterality Date  . BACK SURGERY    . BIOPSY N/A 11/24/2014   Procedure: BIOPSY;  Surgeon: Danie Binder, MD;  Location: [facility];  Service: Endoscopy;  Laterality: N/A;  . BIOPSY  05/17/2016   Procedure: BIOPSY;  Surgeon: Danie Binder, MD;  Location: [facility];  Service: Endoscopy;;  gastric biopsy  . COLONOSCOPY WITH PROPOFOL N/A 11/24/2014   Dr. Rudie Meyer polyps removed/moderate sized internal hemorrhoids, tubular adenomas. Next surveillance in 3 years  . ESOPHAGOGASTRODUODENOSCOPY (EGD) WITH PROPOFOL N/A 11/24/2014   Dr. Clayburn Pert HH/patent stricture at the gastroesophageal junction, mild non-erosive gastritis, path negative for H.pylori or celiac sprue  . ESOPHAGOGASTRODUODENOSCOPY (EGD) WITH PROPOFOL N/A 05/17/2016   Procedure: ESOPHAGOGASTRODUODENOSCOPY (EGD) WITH PROPOFOL;  Surgeon: Danie Binder, MD;  Location: [facility];  Service: Endoscopy;  Laterality: N/A;  12:45pm  . GIVENS CAPSULE STUDY N/A 12/11/2014   MULTILPLE EROSION IN the stomach WITH ACTIVE OOZING. OCCASIONAL EROSIONS AND RARE ULCER SEEN IN PROXIMAL SMALL BOWEL . No masses or AVMs SEEN. NO OLD BLOOD OR FRESH BLOOD SEEN.   . POLYPECTOMY N/A 11/24/2014    Procedure: POLYPECTOMY;  Surgeon: Danie Binder, MD;  Location: [facility];  Service: Endoscopy;  Laterality: N/A;  . SAVORY DILATION N/A 05/17/2016   Procedure: SAVORY DILATION;  Surgeon: Danie Binder, MD;  Location: [facility];  Service: Endoscopy;  Laterality: N/A;   Social History   Socioeconomic History  . Marital status: Widowed    Spouse name: Not on file  . Number of children: Not on file  . Years of education: Not on file  . Highest education level: Not on file  Occupational History  . Not on file  Social Needs  . Financial resource strain: Not on file  . Food insecurity:    Worry: Not on file    Inability: Not on file  . Transportation needs:    Medical: Not on file    Non-medical: Not on file  Tobacco Use  . Smoking status: Former Smoker    Packs/day: 0.25    Years: 20.00    Pack years: 5.00    Types: Cigarettes    Last attempt to quit: 01/24/1995    Years since quitting: 23.1  . Smokeless tobacco: Never Used  Substance and Sexual Activity  . Alcohol use: No    Alcohol/week: 0.0 standard drinks  . Drug use: No  . Sexual activity: Never    Birth control/protection: Post-menopausal  Lifestyle  . Physical activity:    Days per week: Not on file    Minutes per session: Not  on file  . Stress: Not on file  Relationships  . Social connections:    Talks on phone: Not on file    Gets together: Not on file    Attends religious service: Not on file    Active member of club or organization: Not on file    Attends meetings of clubs or organizations: Not on file    Relationship status: Not on file  Other Topics Concern  . Not on file  Social History Narrative  . Not on file   Outpatient Encounter Medications as of 03/21/2018  Medication Sig  . acetaminophen (TYLENOL) 325 MG tablet Take 650 mg by mouth 3 (three) times daily as needed.  . AMITIZA 8 MCG capsule Take 8 mcg by mouth daily with breakfast.   . ARIPiprazole (ABILIFY) 2 MG tablet Take 1 tablet (2 mg  total) by mouth daily.  . calcium carbonate (TUMS - DOSED IN MG ELEMENTAL CALCIUM) 500 MG chewable tablet Chew 1 tablet by mouth 4 (four) times daily as needed for indigestion or heartburn.   . cholecalciferol (VITAMIN D) 1000 units tablet Take 2,000 Units by mouth daily.  . clonazePAM (KLONOPIN) 0.5 MG tablet Take 1 tablet (0.5 mg total) by mouth 3 (three) times daily.  . DULoxetine (CYMBALTA) 60 MG capsule Take 120 mg by mouth daily.   Marland Kitchen ENULOSE 10 GM/15ML SOLN Take 20 g by mouth 2 (two) times daily as needed.   . fluticasone (FLONASE) 50 MCG/ACT nasal spray Place into both nostrils as needed for allergies or rhinitis.  Marland Kitchen gabapentin (NEURONTIN) 300 MG capsule Take 300 mg by mouth 3 (three) times daily.  Marland Kitchen HYDROcodone-acetaminophen (NORCO) 10-325 MG tablet   . HYDROcodone-acetaminophen (NORCO/VICODIN) 5-325 MG tablet 2 (two) times daily.  . hydrocortisone (ANUSOL-HC) 2.5 % rectal cream Place 1 application rectally 2 (two) times daily.  . iron polysaccharides (NIFEREX) 150 MG capsule Take 1 capsule (150 mg total) by mouth daily.  Marland Kitchen latanoprost (XALATAN) 0.005 % ophthalmic solution Place 1 drop into both eyes at bedtime.  Marland Kitchen LEVEMIR FLEXTOUCH 100 UNIT/ML Pen INJECT 20 UNITS SUBCUTANEOUSLY AT BEDTIME.  Marland Kitchen loperamide (IMODIUM) 2 MG capsule Take 2 mg by mouth. Take 1 capsule by mouth after each loose stool  . metoprolol succinate (TOPROL-XL) 25 MG 24 hr tablet Take 0.5 tablets (12.5 mg total) by mouth daily. (Patient taking differently: Take 12.5 mg by mouth 2 (two) times daily. )  . midodrine (PROAMATINE) 2.5 MG tablet Take 2.5 mg by mouth 3 (three) times daily with meals.   . mirabegron ER (MYRBETRIQ) 25 MG TB24 tablet Take 25 mg by mouth daily.  . mirtazapine (REMERON) 30 MG tablet Take 30 mg by mouth at bedtime.    Marland Kitchen MOVANTIK 12.5 MG TABS tablet TAKE 1 TABLET BY MOUTH ONCE DAILY AS NEEDED FOR CONSTIPATION.HOLD FORDIARRHEA.  Marland Kitchen omeprazole (PRILOSEC) 40 MG capsule Take 40 mg by mouth daily.  .  ondansetron (ZOFRAN) 4 MG tablet Take 1 tablet (4 mg total) by mouth 4 (four) times daily -  before meals and at bedtime. (Patient taking differently: Take 4 mg by mouth as needed. )  . polyethylene glycol powder (GLYCOLAX/MIRALAX) powder Take 17 g by mouth daily.  Derrill Memo ON 03/22/2018] risperiDONE (RISPERDAL) 2 MG tablet Take 1 tablet (2 mg total) by mouth daily.  . simvastatin (ZOCOR) 20 MG tablet Take 20 mg by mouth at bedtime.   Marland Kitchen tiZANidine (ZANAFLEX) 2 MG tablet Take 2 mg by mouth daily.  Marland Kitchen warfarin (COUMADIN) 5  MG tablet Take coumadin 5mg  tonight then increase dose to 5mg  daily except 2.5mg  on Mondays, Wednesdays and Fridays  . zolpidem (AMBIEN) 10 MG tablet Take 1 tablet (10 mg total) by mouth at bedtime.  . [DISCONTINUED] NOVOLOG FLEXPEN 100 UNIT/ML FlexPen INJECT 5 UNITS SUBCUTANEOUSLY BEFORE MEALS IF BS IS BETWEEN 90-150.  . [DISCONTINUED] NOVOLOG FLEXPEN 100 UNIT/ML FlexPen SS: 151-200=1 UNIT;201-250=2 UNITS;251-300 ADD 3 UNITS; 301-350 ADD 4UNITS; 351-400 ADD 5 UNITS; 401-450 ADD 6 UNITS AND CALL DOCTOR. UNITS  . docusate sodium (COLACE) 100 MG capsule Take 200 mg by mouth at bedtime.   . polyvinyl alcohol (ARTIFICIAL TEARS) 1.4 % ophthalmic solution Place 1 drop into both eyes 3 (three) times daily.  . solifenacin (VESICARE) 5 MG tablet Take 5 mg by mouth daily.  . [DISCONTINUED] Insulin Aspart (NOVOLOG FLEXPEN New ) Inject 10-16 Units into the skin 3 (three) times daily before meals.    No facility-administered encounter medications on file as of 03/21/2018.    ALLERGIES: Allergies  Allergen Reactions  . Sulfa Antibiotics Rash  . Sulfonamide Derivatives Rash    REACTION: rash   VACCINATION STATUS: There is no immunization history for the selected administration types on file for this patient.  Diabetes  She presents for her follow-up diabetic visit. She has type 2 diabetes mellitus. Onset time: She was diagnosed at approximate age of 7 years. Her disease course has been  stable. There are no hypoglycemic associated symptoms. Pertinent negatives for hypoglycemia include no confusion, pallor or seizures. Pertinent negatives for diabetes include no polydipsia, no polyphagia and no polyuria. There are no hypoglycemic complications. Symptoms are improving. Diabetic complications include a CVA. Risk factors for coronary artery disease include dyslipidemia, diabetes mellitus, obesity, sedentary lifestyle and hypertension. Current diabetic treatment includes intensive insulin program and oral agent (monotherapy) (She kept requiring large dose of insulin due to consumption of large quantities of processed carbohydrates.Marland Kitchen). Her weight is fluctuating minimally. She is following a generally unhealthy diet. When asked about meal planning, she reported none. She never participates in exercise. Her home blood glucose trend is decreasing steadily. Her dinner blood glucose range is generally 130-140 mg/dl. (Patient was sent without her blood glucose records.  Her previsit labs show A1c of 6.3% remaining stable.) An ACE inhibitor/angiotensin II receptor blocker is being taken.  Hypertension  This is a chronic problem. The current episode started more than 1 year ago. The problem is uncontrolled (Was found to have hypotension of 77/48, repeat 113/67.). Pertinent negatives include no palpitations or shortness of breath. Risk factors for coronary artery disease include dyslipidemia and diabetes mellitus. Past treatments include beta blockers (She is currently on a low-dose metoprolol.). Hypertensive end-organ damage includes CVA.  Hyperlipidemia  This is a chronic problem. The current episode started more than 1 year ago. Exacerbating diseases include diabetes. Pertinent negatives include no shortness of breath. Risk factors for coronary artery disease include diabetes mellitus, dyslipidemia, hypertension, obesity and a sedentary lifestyle.    Review of Systems  Constitutional: Negative for  unexpected weight change.       Uses walker to get around.   HENT: Negative for trouble swallowing and voice change.   Eyes: Negative for visual disturbance.  Respiratory: Negative for shortness of breath and wheezing.   Cardiovascular: Negative for palpitations and leg swelling.  Gastrointestinal: Negative for diarrhea.  Endocrine: Negative for cold intolerance, heat intolerance, polydipsia, polyphagia and polyuria.  Skin: Negative for color change, pallor and wound.  Neurological: Negative for seizures.  Psychiatric/Behavioral: Negative for  confusion and suicidal ideas.    Objective:    BP 113/67   Pulse 95   SpO2 94%   Wt Readings from Last 3 Encounters:  01/21/18 137 lb 9.6 oz (62.4 kg)  07/06/17 151 lb (68.5 kg)  06/27/17 138 lb (62.6 kg)    Physical Exam  Constitutional: She is oriented to person, place, and time.  She was found to be lethargic this morning with a series of low blood pressure readings 83/41, 89/40, and 70/38 with pulse rate of 77.  HENT:  Head: Normocephalic and atraumatic.  She is a mouth breather, has dry mucous membranes.  Eyes: EOM are normal.  Neck: Normal range of motion. Neck supple. No tracheal deviation present. No thyromegaly present.  Cardiovascular: Normal rate and regular rhythm.  Pulmonary/Chest: Effort normal.  Abdominal: There is no abdominal tenderness. There is no guarding.  Musculoskeletal: Normal range of motion.        General: No edema.  Neurological: She is oriented to person, place, and time.  She is oriented to time, place, and person.  She is on a wheelchair.    Skin: Skin is warm and dry. No rash noted. No erythema. No pallor.  Psychiatric: She has a normal mood and affect. Judgment normal.    Results for orders placed or performed in visit on 03/13/18  POCT INR  Result Value Ref Range   INR 3.7 (A) 2.0 - 3.0   Complete Blood Count (Most recent): Lab Results  Component Value Date   WBC 6.8 06/27/2017   HGB 13.8  06/27/2017   HCT 40.8 06/27/2017   MCV 88.9 06/27/2017   PLT 250 06/27/2017   Chemistry (most recent): Lab Results  Component Value Date   NA 140 03/15/2018   K 3.9 03/15/2018   CL 100 03/15/2018   CO2 32 03/15/2018   BUN 7 03/15/2018   CREATININE 0.59 (L) 03/15/2018   Diabetic Labs (most recent): Lab Results  Component Value Date   HGBA1C 6.3 (H) 03/15/2018   HGBA1C 5.8 (H) 11/23/2017   HGBA1C 6.5 (H) 03/20/2017   Lipid Panel     Component Value Date/Time   CHOL 133 10/07/2016 0620   TRIG 252 (H) 10/07/2016 0620   HDL 28 (L) 10/07/2016 0620   CHOLHDL 4.8 10/07/2016 0620   VLDL 50 (H) 10/07/2016 0620   LDLCALC 55 10/07/2016 0620     Assessment & Plan:   1. Type 2 diabetes mellitus with vascular disease ([place]) Her diabetes is  complicated by recurrent CVA. She returns without her logs , previsit labs show A1c of 6.3%.      - Patient remains at a high risk for more acute and chronic complications of diabetes which include CAD, CVA, CKD, retinopathy, and neuropathy. These are all discussed in detail with the patient.  - I have re-counseled the patient on diet management and weight loss  by adopting a carbohydrate restricted / protein rich  Diet.  - Patient is advised to stick to a routine mealtimes to eat 3 meals  a day and avoid unnecessary snacks ( to snack only to correct hypoglycemia).  - I have approached patient with the following individualized plan to manage diabetes and patient agrees.   -Due to her unpredictable food intake, she is advised to hold NovoLog for now.  She will be continued on Levemir 20 units nightly, continue to monitor blood glucose 4 times a day-before meals and at bedtime and return in 1 week with her logs for  reevaluation.  -Patient is encouraged to call clinic for blood glucose levels less than 70 or above 300 mg /dl.  - she is not a suitable candidate for metformin nor SGLT2 inhibitor therapy.   - Patient specific target  for A1c; LDL,  HDL, Triglycerides, and  Waist Circumference were discussed in detail.  2) BP/HTN: She did have hypotension on clinic evaluation, not on antihypertensive medications except low-dose metoprolol.  Her repeat blood pressure was 130/67.  This needs follow-up.    3) Lipids/HPL: Her recent lipid panel from September 2018 showed LDL of 55.  She is advised to continue her current simvastatin 20 mg p.o. nightly.    4)  Weight/Diet:  exercise, and carbohydrates information provided.  5) Chronic Care/Health Maintenance:  -Continue to need one-on-one assistance at mealtimes as well as diligent hydration.  Patient needs to keep her appointment with Ophthalmology, Podiatrist at least yearly or according to recommendations, and advised to  stay away from smoking. I have recommended yearly flu vaccine and pneumonia vaccination at least every 5 years; and  sleep for at least 7 hours a day.  I advised patient to maintain close follow up with her PCP for primary care needs.  - Time spent with the patient: 25 min, of which >50% was spent in reviewing her  current and  previous labs/studies, previous treatments, and medications doses and developing a plan for long-term care based on the latest recommendations for standards of care. [name] participated in the discussions, expressed understanding, and voiced agreement with the above plans.  All questions were answered to her satisfaction. she is encouraged to contact clinic should she have any questions or concerns prior to her return visit.    Follow up plan: Return in about 1 week (around 03/28/2018) for Follow up with Meter and Logs Only - no Labs.  Glade Lloyd, MD Phone: [phone]  Fax: [phone]   This note was partially dictated with voice recognition software. Similar sounding words can be transcribed inadequately or may not  be corrected upon review.  03/21/2018, 10:24 AM

## 2018-03-25 ENCOUNTER — Encounter (HOSPITAL_COMMUNITY): Payer: Self-pay | Admitting: Psychiatry

## 2018-03-25 ENCOUNTER — Ambulatory Visit (INDEPENDENT_AMBULATORY_CARE_PROVIDER_SITE_OTHER): Payer: Medicare Other | Admitting: Psychiatry

## 2018-03-25 ENCOUNTER — Telehealth (HOSPITAL_COMMUNITY): Payer: Self-pay | Admitting: Psychiatry

## 2018-03-25 VITALS — BP 66/39 | HR 72

## 2018-03-25 DIAGNOSIS — F209 Schizophrenia, unspecified: Secondary | ICD-10-CM

## 2018-03-25 DIAGNOSIS — M961 Postlaminectomy syndrome, not elsewhere classified: Secondary | ICD-10-CM | POA: Diagnosis not present

## 2018-03-25 DIAGNOSIS — I69354 Hemiplegia and hemiparesis following cerebral infarction affecting left non-dominant side: Secondary | ICD-10-CM | POA: Diagnosis not present

## 2018-03-25 DIAGNOSIS — M545 Low back pain: Secondary | ICD-10-CM | POA: Diagnosis not present

## 2018-03-25 DIAGNOSIS — G894 Chronic pain syndrome: Secondary | ICD-10-CM | POA: Diagnosis not present

## 2018-03-25 DIAGNOSIS — I671 Cerebral aneurysm, nonruptured: Secondary | ICD-10-CM | POA: Diagnosis not present

## 2018-03-25 DIAGNOSIS — E1165 Type 2 diabetes mellitus with hyperglycemia: Secondary | ICD-10-CM | POA: Diagnosis not present

## 2018-03-25 DIAGNOSIS — I10 Essential (primary) hypertension: Secondary | ICD-10-CM | POA: Diagnosis not present

## 2018-03-25 LAB — CBC
HCT: 36 % (ref 35.0–45.0)
Hemoglobin: 12.3 g/dL (ref 11.7–15.5)
MCH: 30.2 pg (ref 27.0–33.0)
MCHC: 34.2 g/dL (ref 32.0–36.0)
MCV: 88.5 fL (ref 80.0–100.0)
MPV: 11.1 fL (ref 7.5–12.5)
Platelets: 357 10*3/uL (ref 140–400)
RBC: 4.07 10*6/uL (ref 3.80–5.10)
RDW: 12.9 % (ref 11.0–15.0)
WBC: 8.7 10*3/uL (ref 3.8–10.8)

## 2018-03-25 LAB — COMPREHENSIVE METABOLIC PANEL
AG Ratio: 1.2 (calc) (ref 1.0–2.5)
ALT: 4 U/L — ABNORMAL LOW (ref 6–29)
AST: 10 U/L (ref 10–35)
Albumin: 3.6 g/dL (ref 3.6–5.1)
Alkaline phosphatase (APISO): 83 U/L (ref 37–153)
BUN: 11 mg/dL (ref 7–25)
CO2: 29 mmol/L (ref 20–32)
Calcium: 9 mg/dL (ref 8.6–10.4)
Chloride: 102 mmol/L (ref 98–110)
Creat: 0.74 mg/dL (ref 0.60–0.93)
Globulin: 2.9 g/dL (calc) (ref 1.9–3.7)
Glucose, Bld: 192 mg/dL — ABNORMAL HIGH (ref 65–139)
Potassium: 4.1 mmol/L (ref 3.5–5.3)
Sodium: 138 mmol/L (ref 135–146)
Total Bilirubin: 0.3 mg/dL (ref 0.2–1.2)
Total Protein: 6.5 g/dL (ref 6.1–8.1)

## 2018-03-25 MED ORDER — ZOLPIDEM TARTRATE 10 MG PO TABS: 10.0000 mg | ORAL_TABLET | Freq: Every day | ORAL | 2 refills | Status: DC

## 2018-03-25 NOTE — Telephone Encounter (Signed)
Spoke with [facility] is informed

## 2018-03-25 NOTE — Telephone Encounter (Signed)
Could you contact the facility, and updated them about the blood test (CBC, CMP) this morning. Labs are within acceptable range except high blood sugar (Glu 192). However, would advise to contact her primary care regarding her low blood pressure this morning as we discussed at her visit.

## 2018-03-25 NOTE — Patient Instructions (Addendum)
1. Continue duloxetine 120 mg daily 2. Continue Mirtazapine 30 mg at night 3. Continue Abilfy 2 mg daily 4. Discontinue risperidone  5. Zolpidem 10 mg at night - hold for drowsiness 6. Continue clonazepam 0.5 mg twice a day for anxiety 7. Return to clinic in three moths for 30 mins Please contact her primary care doctor regarding her low blood pressure today

## 2018-03-26 DIAGNOSIS — I639 Cerebral infarction, unspecified: Secondary | ICD-10-CM | POA: Diagnosis not present

## 2018-03-26 DIAGNOSIS — M25569 Pain in unspecified knee: Secondary | ICD-10-CM | POA: Diagnosis not present

## 2018-03-26 DIAGNOSIS — M545 Low back pain: Secondary | ICD-10-CM | POA: Diagnosis not present

## 2018-03-28 DIAGNOSIS — M25569 Pain in unspecified knee: Secondary | ICD-10-CM | POA: Diagnosis not present

## 2018-03-28 DIAGNOSIS — M545 Low back pain: Secondary | ICD-10-CM | POA: Diagnosis not present

## 2018-03-28 DIAGNOSIS — I639 Cerebral infarction, unspecified: Secondary | ICD-10-CM | POA: Diagnosis not present

## 2018-03-29 ENCOUNTER — Encounter: Payer: Self-pay | Admitting: "Endocrinology

## 2018-03-29 ENCOUNTER — Ambulatory Visit (INDEPENDENT_AMBULATORY_CARE_PROVIDER_SITE_OTHER): Payer: Medicare Other | Admitting: "Endocrinology

## 2018-03-29 VITALS — BP 92/62 | HR 72 | Ht 65.0 in | Wt 139.0 lb

## 2018-03-29 DIAGNOSIS — E782 Mixed hyperlipidemia: Secondary | ICD-10-CM

## 2018-03-29 DIAGNOSIS — I10 Essential (primary) hypertension: Secondary | ICD-10-CM

## 2018-03-29 DIAGNOSIS — E1159 Type 2 diabetes mellitus with other circulatory complications: Secondary | ICD-10-CM

## 2018-03-29 DIAGNOSIS — I959 Hypotension, unspecified: Secondary | ICD-10-CM

## 2018-03-29 NOTE — Progress Notes (Signed)
Endocrinology follow-up note   Subjective:    Patient ID: [name], female    DOB: 1945/11/17,    Past Medical History:  Diagnosis Date  . Anxiety   . Arthritis   . Dependence on wheelchair    pivot/transfers  . Depression    History of psychosis and previous suicide attempt  . DVT, lower extremity, recurrent (HCC)    Long-term Coumadin per Dr. Legrand Rams  . Essential hypertension   . GERD (gastroesophageal reflux disease)   . Hemiplegia ([place]) 2010   Left side  . History of stroke    Acute infarct and right cerebral white matter small vessel disease 12/10  . Leg DVT (deep venous thromboembolism), acute ([place]) 2006  . Schizophrenia ([place])   . Stroke [facility])    left sided weakness  . Type 2 diabetes mellitus ([place])    Past Surgical History:  Procedure Laterality Date  . BACK SURGERY    . BIOPSY N/A 11/24/2014   Procedure: BIOPSY;  Surgeon: Danie Binder, MD;  Location: [facility];  Service: Endoscopy;  Laterality: N/A;  . BIOPSY  05/17/2016   Procedure: BIOPSY;  Surgeon: Danie Binder, MD;  Location: [facility];  Service: Endoscopy;;  gastric biopsy  . COLONOSCOPY WITH PROPOFOL N/A 11/24/2014   Dr. Rudie Meyer polyps removed/moderate sized internal hemorrhoids, tubular adenomas. Next surveillance in 3 years  . ESOPHAGOGASTRODUODENOSCOPY (EGD) WITH PROPOFOL N/A 11/24/2014   Dr. Clayburn Pert HH/patent stricture at the gastroesophageal junction, mild non-erosive gastritis, path negative for H.pylori or celiac sprue  . ESOPHAGOGASTRODUODENOSCOPY (EGD) WITH PROPOFOL N/A 05/17/2016   Procedure: ESOPHAGOGASTRODUODENOSCOPY (EGD) WITH PROPOFOL;  Surgeon: Danie Binder, MD;  Location: [facility];  Service: Endoscopy;  Laterality: N/A;  12:45pm  . GIVENS CAPSULE STUDY N/A 12/11/2014   MULTILPLE EROSION IN the stomach WITH ACTIVE OOZING. OCCASIONAL EROSIONS AND RARE ULCER SEEN IN PROXIMAL SMALL BOWEL . No masses or AVMs SEEN. NO OLD BLOOD OR FRESH BLOOD SEEN.   . POLYPECTOMY N/A 11/24/2014    Procedure: POLYPECTOMY;  Surgeon: Danie Binder, MD;  Location: [facility];  Service: Endoscopy;  Laterality: N/A;  . SAVORY DILATION N/A 05/17/2016   Procedure: SAVORY DILATION;  Surgeon: Danie Binder, MD;  Location: [facility];  Service: Endoscopy;  Laterality: N/A;   Family History  Problem Relation Age of Onset  . Hypertension Mother   . Colon cancer Neg Hx     Social History   Socioeconomic History  . Marital status: Widowed    Spouse name: Not on file  . Number of children: Not on file  . Years of education: Not on file  . Highest education level: Not on file  Occupational History  . Not on file  Social Needs  . Financial resource strain: Not on file  . Food insecurity:    Worry: Not on file    Inability: Not on file  . Transportation needs:    Medical: Not on file    Non-medical: Not on file  Tobacco Use  . Smoking status: Former Smoker    Packs/day: 0.25    Years: 20.00    Pack years: 5.00    Types: Cigarettes    Last attempt to quit: 01/24/1995    Years since quitting: 23.1  . Smokeless tobacco: Never Used  Substance and Sexual Activity  . Alcohol use: No    Alcohol/week: 0.0 standard drinks  . Drug use: No  . Sexual activity: Never    Birth control/protection:  Post-menopausal  Lifestyle  . Physical activity:    Days per week: Not on file    Minutes per session: Not on file  . Stress: Not on file  Relationships  . Social connections:    Talks on phone: Not on file    Gets together: Not on file    Attends religious service: Not on file    Active member of club or organization: Not on file    Attends meetings of clubs or organizations: Not on file    Relationship status: Not on file  Other Topics Concern  . Not on file  Social History Narrative  . Not on file   Outpatient Encounter Medications as of 03/29/2018  Medication Sig  . acetaminophen (TYLENOL) 325 MG tablet Take 650 mg by mouth 3 (three) times daily as needed.  . AMITIZA 8 MCG capsule  Take 8 mcg by mouth daily with breakfast.   . ARIPiprazole (ABILIFY) 2 MG tablet Take 1 tablet (2 mg total) by mouth daily.  . calcium carbonate (TUMS - DOSED IN MG ELEMENTAL CALCIUM) 500 MG chewable tablet Chew 1 tablet by mouth 4 (four) times daily as needed for indigestion or heartburn.   . cholecalciferol (VITAMIN D) 1000 units tablet Take 2,000 Units by mouth daily.  . clonazePAM (KLONOPIN) 0.5 MG tablet Take 1 tablet (0.5 mg total) by mouth 3 (three) times daily.  Marland Kitchen docusate sodium (COLACE) 100 MG capsule Take 200 mg by mouth at bedtime.   . DULoxetine (CYMBALTA) 60 MG capsule Take 120 mg by mouth daily.   Marland Kitchen ENULOSE 10 GM/15ML SOLN Take 20 g by mouth 2 (two) times daily as needed.   . fluticasone (FLONASE) 50 MCG/ACT nasal spray Place into both nostrils as needed for allergies or rhinitis.  Marland Kitchen gabapentin (NEURONTIN) 300 MG capsule Take 300 mg by mouth 3 (three) times daily.  Marland Kitchen HYDROcodone-acetaminophen (NORCO) 10-325 MG tablet   . HYDROcodone-acetaminophen (NORCO/VICODIN) 5-325 MG tablet 2 (two) times daily.  . hydrocortisone (ANUSOL-HC) 2.5 % rectal cream Place 1 application rectally 2 (two) times daily.  . iron polysaccharides (NIFEREX) 150 MG capsule Take 1 capsule (150 mg total) by mouth daily.  Marland Kitchen latanoprost (XALATAN) 0.005 % ophthalmic solution Place 1 drop into both eyes at bedtime.  Marland Kitchen LEVEMIR FLEXTOUCH 100 UNIT/ML Pen INJECT 20 UNITS SUBCUTANEOUSLY AT BEDTIME.  Marland Kitchen loperamide (IMODIUM) 2 MG capsule Take 2 mg by mouth. Take 1 capsule by mouth after each loose stool  . metoprolol succinate (TOPROL-XL) 25 MG 24 hr tablet Take 0.5 tablets (12.5 mg total) by mouth daily. (Patient taking differently: Take 12.5 mg by mouth 2 (two) times daily. )  . midodrine (PROAMATINE) 2.5 MG tablet Take 2.5 mg by mouth 3 (three) times daily with meals.   . mirabegron ER (MYRBETRIQ) 25 MG TB24 tablet Take 25 mg by mouth daily.  . mirtazapine (REMERON) 30 MG tablet Take 30 mg by mouth at bedtime.    Marland Kitchen  MOVANTIK 12.5 MG TABS tablet TAKE 1 TABLET BY MOUTH ONCE DAILY AS NEEDED FOR CONSTIPATION.HOLD FORDIARRHEA.  Marland Kitchen omeprazole (PRILOSEC) 40 MG capsule Take 40 mg by mouth daily.  . ondansetron (ZOFRAN) 4 MG tablet Take 1 tablet (4 mg total) by mouth 4 (four) times daily -  before meals and at bedtime. (Patient taking differently: Take 4 mg by mouth as needed. )  . polyethylene glycol powder (GLYCOLAX/MIRALAX) powder Take 17 g by mouth daily.  . polyvinyl alcohol (ARTIFICIAL TEARS) 1.4 % ophthalmic solution Place 1 drop  into both eyes 3 (three) times daily.  . simvastatin (ZOCOR) 20 MG tablet Take 20 mg by mouth at bedtime.   . solifenacin (VESICARE) 5 MG tablet Take 5 mg by mouth daily.  Marland Kitchen tiZANidine (ZANAFLEX) 2 MG tablet Take 2 mg by mouth daily.  Marland Kitchen warfarin (COUMADIN) 5 MG tablet Take coumadin 5mg  tonight then increase dose to 5mg  daily except 2.5mg  on Mondays, Wednesdays and Fridays  . [START ON 04/04/2018] zolpidem (AMBIEN) 10 MG tablet Take 1 tablet (10 mg total) by mouth at bedtime. Please hold this medication if she has drowsiness   No facility-administered encounter medications on file as of 03/29/2018.    ALLERGIES: Allergies  Allergen Reactions  . Sulfa Antibiotics Rash  . Sulfonamide Derivatives Rash    REACTION: rash   VACCINATION STATUS: There is no immunization history for the selected administration types on file for this patient.  Diabetes  She presents for her follow-up diabetic visit. She has type 2 diabetes mellitus. Onset time: She was diagnosed at approximate age of 24 years. Her disease course has been improving. There are no hypoglycemic associated symptoms. Pertinent negatives for hypoglycemia include no confusion, pallor or seizures. Pertinent negatives for diabetes include no polydipsia, no polyphagia and no polyuria. There are no hypoglycemic complications. Symptoms are improving. Diabetic complications include a CVA. Risk factors for coronary artery disease include  dyslipidemia, diabetes mellitus, obesity, sedentary lifestyle and hypertension. Current diabetic treatment includes intensive insulin program and oral agent (monotherapy) (She kept requiring large dose of insulin due to consumption of large quantities of processed carbohydrates.Marland Kitchen). Her weight is stable. She is following a generally unhealthy diet. When asked about meal planning, she reported none. She never participates in exercise. Her home blood glucose trend is decreasing steadily. Her breakfast blood glucose range is generally 130-140 mg/dl. Her lunch blood glucose range is generally 130-140 mg/dl. Her dinner blood glucose range is generally 130-140 mg/dl. Her overall blood glucose range is 130-140 mg/dl. (-Her recent A1c of 6.3%.  Off of NovoLog, she returns with near target glycemic profile only on Levemir.) An ACE inhibitor/angiotensin II receptor blocker is being taken.  Hypertension  This is a chronic problem. The current episode started more than 1 year ago. The problem is uncontrolled (Was found to have hypotension of 77/48, repeat 113/67.). Pertinent negatives include no palpitations or shortness of breath. Risk factors for coronary artery disease include dyslipidemia and diabetes mellitus. Past treatments include beta blockers (She is currently on a low-dose metoprolol.). Hypertensive end-organ damage includes CVA.  Hyperlipidemia  This is a chronic problem. The current episode started more than 1 year ago. Exacerbating diseases include diabetes. Pertinent negatives include no shortness of breath. Risk factors for coronary artery disease include diabetes mellitus, dyslipidemia, hypertension, obesity and a sedentary lifestyle.    Review of Systems  Constitutional: Negative for unexpected weight change.       Uses walker to get around.   HENT: Negative for trouble swallowing and voice change.   Eyes: Negative for visual disturbance.  Respiratory: Negative for shortness of breath and wheezing.    Cardiovascular: Negative for palpitations and leg swelling.  Gastrointestinal: Negative for diarrhea.  Endocrine: Negative for cold intolerance, heat intolerance, polydipsia, polyphagia and polyuria.  Skin: Negative for color change, pallor and wound.  Neurological: Negative for seizures.  Psychiatric/Behavioral: Negative for confusion and suicidal ideas.    Objective:    BP 92/62   Pulse 72   Ht 5\' 5"  (1.651 m)   Wt 139 lb (  63 kg)   BMI 23.13 kg/m   Wt Readings from Last 3 Encounters:  03/29/18 139 lb (63 kg)  01/21/18 137 lb 9.6 oz (62.4 kg)  07/06/17 151 lb (68.5 kg)    Physical Exam  Constitutional: She is oriented to person, place, and time.  HENT:  Head: Normocephalic and atraumatic.  She is a mouth breather, has dry mucous membranes.  Eyes: EOM are normal.  Neck: Normal range of motion. Neck supple. No tracheal deviation present. No thyromegaly present.  Cardiovascular: Normal rate and regular rhythm.  Pulmonary/Chest: Effort normal.  Abdominal: There is no abdominal tenderness. There is no guarding.  Musculoskeletal: Normal range of motion.        General: No edema.  Neurological: She is oriented to person, place, and time.  She is oriented to time, place, and person.  She is on a wheelchair.    Skin: Skin is warm and dry. No rash noted. No erythema. No pallor.  Psychiatric: She has a normal mood and affect. Judgment normal.    Results for orders placed or performed in visit on 03/13/18  POCT INR  Result Value Ref Range   INR 3.7 (A) 2.0 - 3.0   Complete Blood Count (Most recent): Lab Results  Component Value Date   WBC 8.7 03/25/2018   HGB 12.3 03/25/2018   HCT 36.0 03/25/2018   MCV 88.5 03/25/2018   PLT 357 03/25/2018   Chemistry (most recent): Lab Results  Component Value Date   NA 138 03/25/2018   K 4.1 03/25/2018   CL 102 03/25/2018   CO2 29 03/25/2018   BUN 11 03/25/2018   CREATININE 0.74 03/25/2018   Diabetic Labs (most recent): Lab  Results  Component Value Date   HGBA1C 6.3 (H) 03/15/2018   HGBA1C 5.8 (H) 11/23/2017   HGBA1C 6.5 (H) 03/20/2017   Lipid Panel     Component Value Date/Time   CHOL 133 10/07/2016 0620   TRIG 252 (H) 10/07/2016 0620   HDL 28 (L) 10/07/2016 0620   CHOLHDL 4.8 10/07/2016 0620   VLDL 50 (H) 10/07/2016 0620   LDLCALC 55 10/07/2016 0620     Assessment & Plan:   1. Type 2 diabetes mellitus with vascular disease ([place]) Her diabetes is  complicated by recurrent CVA. She returns with her logs showing near target glycemic profile only on Levemir, off of NovoLog.    - Patient remains at a high risk for more acute and chronic complications of diabetes which include CAD, CVA, CKD, retinopathy, and neuropathy. These are all discussed in detail with the patient.  - I have re-counseled the patient on diet management and weight loss  by adopting a carbohydrate restricted / protein rich  Diet.  - Patient is advised to stick to a routine mealtimes to eat 3 meals  a day and avoid unnecessary snacks ( to snack only to correct hypoglycemia).  - I have approached patient with the following individualized plan to manage diabetes and patient agrees.   -Due to her unpredictable food intake, she is advised to continue to hold NovoLog for now.  She will be continued only on Levemir 20 units nightly, monitor blood glucose 2 times a day-daily before breakfast and at bedtime.   -Patient is encouraged to call clinic for blood glucose levels less than 70 or above 300 mg /dl.  - she is not a suitable candidate for metformin nor SGLT2 inhibitor therapy.   - Patient specific target  for A1c; LDL, HDL, Triglycerides,  and  Waist Circumference were discussed in detail.  2) BP/HTN: She has marginal blood pressure on 2 clinic visits now.  She is only on a low-dose metoprolol.  Her blood pressure needs follow-up.   3) Lipids/HPL: Her recent lipid panel from September 2018 showed LDL of 55.  She is advised to continue  her current simvastatin 20 mg p.o. nightly.    4)  Weight/Diet:  exercise, and carbohydrates information provided.  5) Chronic Care/Health Maintenance:  -Continue to need one-on-one assistance at mealtimes as well as diligent hydration.  Patient needs to keep her appointment with Ophthalmology, Podiatrist at least yearly or according to recommendations, and advised to  stay away from smoking. I have recommended yearly flu vaccine and pneumonia vaccination at least every 5 years; and  sleep for at least 7 hours a day.  I advised patient to maintain close follow up with her PCP for primary care needs.  - Time spent with the patient: 25 min, of which >50% was spent in reviewing her blood glucose logs , discussing her hypoglycemia and hyperglycemia episodes, reviewing her current and  previous labs / studies and medications  doses and developing a plan to avoid hypoglycemia and hyperglycemia. Please refer to Patient Instructions for Blood Glucose Monitoring and Insulin/Medications Dosing Guide"  in media tab for additional information. Please  also refer to " Patient Self Inventory" in the Media  tab for reviewed elements of pertinent patient history. [name] participated in the discussions, expressed understanding, and voiced agreement with the above plans.  All questions were answered to her satisfaction. she is encouraged to contact clinic should she have any questions or concerns prior to her return visit.   Follow up plan: Return in about 3 months (around 06/29/2018) for Follow up with Pre-visit Labs, Meter, and Logs.  Glade Lloyd, MD Phone: [phone]  Fax: [phone]   This note was partially dictated with voice recognition software. Similar sounding words can be transcribed inadequately or may not  be corrected upon review.  03/29/2018, 2:03 PM

## 2018-03-29 NOTE — Progress Notes (Signed)
LeighAnn Eriverto Byrnes, CMA  

## 2018-04-02 ENCOUNTER — Ambulatory Visit: Payer: Medicare Other | Admitting: "Endocrinology

## 2018-04-10 ENCOUNTER — Other Ambulatory Visit: Payer: Self-pay

## 2018-04-10 ENCOUNTER — Ambulatory Visit (INDEPENDENT_AMBULATORY_CARE_PROVIDER_SITE_OTHER): Payer: Medicare Other | Admitting: *Deleted

## 2018-04-10 DIAGNOSIS — Z8679 Personal history of other diseases of the circulatory system: Secondary | ICD-10-CM | POA: Diagnosis not present

## 2018-04-10 DIAGNOSIS — Z5181 Encounter for therapeutic drug level monitoring: Secondary | ICD-10-CM | POA: Diagnosis not present

## 2018-04-10 LAB — POCT INR: INR: 5.7 — AB (ref 2.0–3.0)

## 2018-04-10 NOTE — Patient Instructions (Signed)
Hold coumadin x 3 days (Wed,Thurs,Fri) then decrease dose to 1/2 tablets daily except 1 tablet on Tuesdays, Thursdays and Saturdays Recheck in 2 weeks

## 2018-04-19 ENCOUNTER — Telehealth (HOSPITAL_COMMUNITY): Payer: Self-pay | Admitting: *Deleted

## 2018-04-19 ENCOUNTER — Other Ambulatory Visit (HOSPITAL_COMMUNITY): Payer: Self-pay | Admitting: Psychiatry

## 2018-04-19 DIAGNOSIS — I69318 Other symptoms and signs involving cognitive functions following cerebral infarction: Secondary | ICD-10-CM | POA: Diagnosis not present

## 2018-04-19 DIAGNOSIS — F0150 Vascular dementia without behavioral disturbance: Secondary | ICD-10-CM | POA: Diagnosis not present

## 2018-04-19 DIAGNOSIS — Z7901 Long term (current) use of anticoagulants: Secondary | ICD-10-CM | POA: Diagnosis not present

## 2018-04-19 DIAGNOSIS — E1151 Type 2 diabetes mellitus with diabetic peripheral angiopathy without gangrene: Secondary | ICD-10-CM | POA: Diagnosis not present

## 2018-04-19 DIAGNOSIS — I69354 Hemiplegia and hemiparesis following cerebral infarction affecting left non-dominant side: Secondary | ICD-10-CM | POA: Diagnosis not present

## 2018-04-19 DIAGNOSIS — Z86718 Personal history of other venous thrombosis and embolism: Secondary | ICD-10-CM | POA: Diagnosis not present

## 2018-04-19 DIAGNOSIS — F209 Schizophrenia, unspecified: Secondary | ICD-10-CM | POA: Diagnosis not present

## 2018-04-19 DIAGNOSIS — Z794 Long term (current) use of insulin: Secondary | ICD-10-CM | POA: Diagnosis not present

## 2018-04-19 DIAGNOSIS — I10 Essential (primary) hypertension: Secondary | ICD-10-CM | POA: Diagnosis not present

## 2018-04-19 MED ORDER — RISPERIDONE 1 MG PO TABS: 1.0000 mg | ORAL_TABLET | Freq: Every day | ORAL | 0 refills | Status: DC

## 2018-04-19 NOTE — Telephone Encounter (Signed)
Discussed with Butch Penny, Furniture conservator/restorer. [name] stays in the room most of the time, is nauseated and has decreased appetite. Her BP this morning was within normal limit. Although she was checked by her PCP, the provider did not comment anything about her BP (SBP in 60's at the visit at this clinic) according to [facility]. Butch Penny is concerned that the patient conditions worsens whenever we try to change her medication.   Although it is very difficult to discern whether her symptoms are solely attributable to mental health, recent basic labs were wnl, and her BP is reportedly within normal range. Will reinitiate risperidone from lower dose;  1 mg. Butch Penny is advised to make sure to monitor BP routinely given risperidone can cause orthostatic hypotension. Butch Penny would contact us if any further concerns.

## 2018-04-19 NOTE — Telephone Encounter (Signed)
Dr Modesta Messing  [facility] stating that after last visit & her Risperdal was cancelled patient has "been out of sorts, she's not eating, nothing satisfies her, she's off the chain not a happy camper". Please call floor supervisor Butch Penny  @ [phone]

## 2018-04-19 NOTE — Telephone Encounter (Signed)
Contacted Butch Penny, details in the chart.

## 2018-04-23 ENCOUNTER — Telehealth: Payer: Self-pay | Admitting: Cardiology

## 2018-04-23 DIAGNOSIS — M545 Low back pain: Secondary | ICD-10-CM | POA: Diagnosis not present

## 2018-04-23 DIAGNOSIS — M961 Postlaminectomy syndrome, not elsewhere classified: Secondary | ICD-10-CM | POA: Diagnosis not present

## 2018-04-23 DIAGNOSIS — I671 Cerebral aneurysm, nonruptured: Secondary | ICD-10-CM | POA: Diagnosis not present

## 2018-04-23 DIAGNOSIS — I69354 Hemiplegia and hemiparesis following cerebral infarction affecting left non-dominant side: Secondary | ICD-10-CM | POA: Diagnosis not present

## 2018-04-23 NOTE — Telephone Encounter (Signed)
Confirmed appt with Butch Penny - they were not aware of location change. Gave instructions of how to pull in but not get out    COVID-19 Pre-Screening Questions:   Do you currently have a fever? No    Have you recently travelled on a cruise, internationally, or to [place], [place], [place], [place], [place], or [place], [place] [employer]) ?  No    Have you been in contact with someone that is currently pending confirmation of Covid19 testing or has been confirmed to have the [place] virus?  No   Are you currently experiencing fatigue or cough? No

## 2018-04-24 ENCOUNTER — Other Ambulatory Visit: Payer: Self-pay

## 2018-04-24 ENCOUNTER — Ambulatory Visit (INDEPENDENT_AMBULATORY_CARE_PROVIDER_SITE_OTHER): Payer: Medicare Other | Admitting: *Deleted

## 2018-04-24 DIAGNOSIS — Z8679 Personal history of other diseases of the circulatory system: Secondary | ICD-10-CM

## 2018-04-24 DIAGNOSIS — I6930 Unspecified sequelae of cerebral infarction: Secondary | ICD-10-CM | POA: Diagnosis not present

## 2018-04-24 DIAGNOSIS — Z5181 Encounter for therapeutic drug level monitoring: Secondary | ICD-10-CM

## 2018-04-24 LAB — POCT INR: INR: 3 (ref 2.0–3.0)

## 2018-04-24 NOTE — Patient Instructions (Signed)
Continue coumadin 1/2 tablets daily except 1 tablet on Tuesdays, Thursdays and Saturdays Recheck in 2 weeks

## 2018-04-25 DIAGNOSIS — I825Y9 Chronic embolism and thrombosis of unspecified deep veins of unspecified proximal lower extremity: Secondary | ICD-10-CM | POA: Diagnosis not present

## 2018-04-25 DIAGNOSIS — E1165 Type 2 diabetes mellitus with hyperglycemia: Secondary | ICD-10-CM | POA: Diagnosis not present

## 2018-05-01 DIAGNOSIS — I69318 Other symptoms and signs involving cognitive functions following cerebral infarction: Secondary | ICD-10-CM | POA: Diagnosis not present

## 2018-05-01 DIAGNOSIS — F0150 Vascular dementia without behavioral disturbance: Secondary | ICD-10-CM | POA: Diagnosis not present

## 2018-05-06 ENCOUNTER — Telehealth: Payer: Self-pay | Admitting: *Deleted

## 2018-05-06 NOTE — Telephone Encounter (Signed)
° °  COVID-19 Pre-Screening Questions: ° °• Do you currently have a fever?NO ° ° °• Have you recently travelled on a cruise, internationally, or to [place], [place], [place], [place], [place], or [place], [place] ([employer]) ? NO °•  °• Have you been in contact with someone that is currently pending confirmation of Covid19 testing or has been confirmed to have the Covid19 virus?  NO °•  °Are you currently experiencing fatigue or cough? NO ° ° °   ° ° ° ° °

## 2018-05-07 ENCOUNTER — Other Ambulatory Visit: Payer: Self-pay

## 2018-05-07 ENCOUNTER — Ambulatory Visit (INDEPENDENT_AMBULATORY_CARE_PROVIDER_SITE_OTHER): Payer: Medicare Other | Admitting: *Deleted

## 2018-05-07 DIAGNOSIS — Z5181 Encounter for therapeutic drug level monitoring: Secondary | ICD-10-CM

## 2018-05-07 DIAGNOSIS — Z8679 Personal history of other diseases of the circulatory system: Secondary | ICD-10-CM

## 2018-05-07 DIAGNOSIS — I6930 Unspecified sequelae of cerebral infarction: Secondary | ICD-10-CM

## 2018-05-07 LAB — POCT INR: INR: 2.1 (ref 2.0–3.0)

## 2018-05-07 NOTE — Patient Instructions (Signed)
Continue coumadin 1/2 tablets daily except 1 tablet on Tuesdays, Thursdays and Saturdays Recheck in 3 weeks

## 2018-05-16 DIAGNOSIS — I10 Essential (primary) hypertension: Secondary | ICD-10-CM | POA: Diagnosis not present

## 2018-05-16 DIAGNOSIS — I825Y9 Chronic embolism and thrombosis of unspecified deep veins of unspecified proximal lower extremity: Secondary | ICD-10-CM | POA: Diagnosis not present

## 2018-05-16 DIAGNOSIS — G8190 Hemiplegia, unspecified affecting unspecified side: Secondary | ICD-10-CM | POA: Diagnosis not present

## 2018-05-24 ENCOUNTER — Telehealth: Payer: Self-pay | Admitting: *Deleted

## 2018-05-24 NOTE — Telephone Encounter (Signed)
° °  _____________   [id number] Pre-Screening Questions:   Do you currently have a fever? (yes = cancel and refer to pcp for e-visit)  no  Have you recently travelled on a cruise, internationally, or to [place], [place], [place], [place], [place], or [place], [place] [employer]) ? (yes = cancel, stay home, monitor symptoms, and contact pcp or initiate e-visit if symptoms develop)   no  Have you been in contact with someone that is currently pending confirmation of Covid19 testing or has been confirmed to have the [place] virus?   (yes = cancel, stay home, away from tested individual, monitor symptoms, and contact pcp or initiate e-visit if symptoms develop)  no  Are you currently experiencing fatigue or cough?(yes = pt should be prepared to have a mask placed at the time of their visit).  no

## 2018-05-27 ENCOUNTER — Ambulatory Visit (INDEPENDENT_AMBULATORY_CARE_PROVIDER_SITE_OTHER): Payer: Medicare Other | Admitting: *Deleted

## 2018-05-27 ENCOUNTER — Other Ambulatory Visit: Payer: Self-pay

## 2018-05-27 DIAGNOSIS — Z5181 Encounter for therapeutic drug level monitoring: Secondary | ICD-10-CM

## 2018-05-27 DIAGNOSIS — I82409 Acute embolism and thrombosis of unspecified deep veins of unspecified lower extremity: Secondary | ICD-10-CM

## 2018-05-27 DIAGNOSIS — Z8679 Personal history of other diseases of the circulatory system: Secondary | ICD-10-CM

## 2018-05-27 LAB — POCT INR: INR: 2.8 (ref 2.0–3.0)

## 2018-05-27 NOTE — Patient Instructions (Signed)
Continue coumadin 1/2 tablets daily except 1 tablet on Tuesdays, Thursdays and Saturdays Recheck in 4 weeks

## 2018-05-28 ENCOUNTER — Telehealth: Payer: Self-pay | Admitting: Gastroenterology

## 2018-05-28 NOTE — Telephone Encounter (Signed)
Spoke with Butch Penny, she re-faxed the forms needed for pt. Papers will be signed and faxed back.

## 2018-05-28 NOTE — Telephone Encounter (Signed)
Butch Penny from [facility] called to say that last week she faxed over a pharmacy review and she was needing that faxed back to her. She said the fax # was on the form.

## 2018-06-06 ENCOUNTER — Other Ambulatory Visit: Payer: Self-pay | Admitting: Cardiovascular Disease

## 2018-06-06 ENCOUNTER — Telehealth (HOSPITAL_COMMUNITY): Payer: Self-pay

## 2018-06-06 DIAGNOSIS — M79674 Pain in right toe(s): Secondary | ICD-10-CM | POA: Diagnosis not present

## 2018-06-06 DIAGNOSIS — B351 Tinea unguium: Secondary | ICD-10-CM | POA: Diagnosis not present

## 2018-06-06 DIAGNOSIS — F209 Schizophrenia, unspecified: Secondary | ICD-10-CM

## 2018-06-06 DIAGNOSIS — M79675 Pain in left toe(s): Secondary | ICD-10-CM | POA: Diagnosis not present

## 2018-06-06 MED ORDER — ABILIFY 2 MG PO TABS: 2.0000 mg | ORAL_TABLET | Freq: Every day | ORAL | 0 refills | Status: DC

## 2018-06-06 NOTE — Telephone Encounter (Signed)
Refill sent in

## 2018-06-06 NOTE — Telephone Encounter (Signed)
A new brandname order for patient's prescribed Ability 2 mg, one daily, #90 with no refills e-scribed to patient's [facility] in [place] as requested for patient and verbally approved by Dr. Modesta Messing.

## 2018-06-06 NOTE — Telephone Encounter (Signed)
Medication refill request - Nash Mantis, pharmacy technician from [facility] called requesting a refill of pt's namebrand Abilify 2 mg, last ordered for 90 days supply on 03/10/18.  [facility] reported they prepackage medications for patient's residence that needed to be sent out by 06/12/2018 and requested Dr. Modesta Messing send in a new refill order prior to that date.  Patient is scheduled for next appointment on 06/25/2018.

## 2018-06-06 NOTE — Telephone Encounter (Signed)
yes

## 2018-06-15 DIAGNOSIS — I825Y9 Chronic embolism and thrombosis of unspecified deep veins of unspecified proximal lower extremity: Secondary | ICD-10-CM | POA: Diagnosis not present

## 2018-06-15 DIAGNOSIS — I10 Essential (primary) hypertension: Secondary | ICD-10-CM | POA: Diagnosis not present

## 2018-06-18 NOTE — Progress Notes (Addendum)
[place] MD/PA/NP OP Progress Note  06/25/2018 1:37 PM [name]  MRN:  [id number]  Chief Complaint:  Chief Complaint    Follow-up; Depression; Schizophrenia     HPI:  On 3/27 Discussed with Butch Penny, Furniture conservator/restorer. Avanni stays in the room most of the time, is nauseated and has decreased appetite. Her BP this morning was within normal limit. Although she was checked by her PCP, the provider did not comment anything about her BP (SBP in 60's at the visit at this clinic) according to [facility]. Butch Penny is concerned that the patient conditions worsens whenever we try to change her medication.  - Restarted risperidone 1 mg   The patient states that she has been feeling anxiety.  She would like to be on higher dose of "nerve pill." She enjoys watching TV. She feels safe at [employer]. She enjoys watching TV. She has decreased appetite .She denies feeling depressed. She denies SI, HI, Ah. VH. She denies paranoia.   The staff at [facility] presents to the interview.  She has been doing well. She enjoys watching TV,  Bingo, puzzle book, coloring. She sleeps well at night. Although she does have appetite loss, she is able to eat full meal when she is encouraged. She tends to complain of leg pain and anxiety a times. No behavioral concern.  Activities of Daily Living (ADLs):  [name] is independent in the following:feeding Needs assistance (mostly secondary to weakness from stroke ): walking (walker),  bathing and hygiene, continence, grooming and toileting,   Visit Diagnosis:    ICD-10-CM   1. Schizophrenia, unspecified type ([place]) F20.9 zolpidem (AMBIEN) 10 MG tablet    ABILIFY 2 MG tablet    Past Psychiatric History: Please see initial evaluation for full details. I have reviewed the history. No updates at this time.     Past Medical History:  Past Medical History:  Diagnosis Date  . Anxiety   . Arthritis   . Dependence on wheelchair    pivot/transfers  . Depression    History of  psychosis and previous suicide attempt  . DVT, lower extremity, recurrent (HCC)    Long-term Coumadin per Dr. Legrand Rams  . Essential hypertension   . GERD (gastroesophageal reflux disease)   . Hemiplegia ([place]) 2010   Left side  . History of stroke    Acute infarct and right cerebral white matter small vessel disease 12/10  . Leg DVT (deep venous thromboembolism), acute ([place]) 2006  . Schizophrenia ([place])   . Stroke [facility])    left sided weakness  . Type 2 diabetes mellitus ([place])     Past Surgical History:  Procedure Laterality Date  . BACK SURGERY    . BIOPSY N/A 11/24/2014   Procedure: BIOPSY;  Surgeon: Danie Binder, MD;  Location: [facility] ORS;  Service: Endoscopy;  Laterality: N/A;  . BIOPSY  05/17/2016   Procedure: BIOPSY;  Surgeon: Danie Binder, MD;  Location: [facility] END[facility] SU[facility];  Service: Endoscopy;;  gastric biopsy  . COLONOSCOPY WITH PROPOFOL N/A 11/24/2014   Dr. Rudie Meyer polyps removed/moderate sized internal hemorrhoids, tubular adenomas. Next surveillance in 3 years  . ESOPHAGOGASTRODUODENOSCOPY (EGD) WITH PROPOFOL N/A 11/24/2014   Dr. Clayburn Pert HH/patent stricture at the gastroesophageal junction, mild non-erosive gastritis, path negative for H.pylori or celiac sprue  . ESOPHAGOGASTRODUODENOSCOPY (EGD) WITH PROPOFOL N/A 05/17/2016   Procedure: ESOPHAGOGASTRODUODENOSCOPY (EGD) WITH PROPOFOL;  Surgeon: Danie Binder, MD;  Location: [facility];  Service: Endoscopy;  Laterality: N/A;  12:45pm  .  GIVENS CAPSULE STUDY N/A 12/11/2014   MULTILPLE EROSION IN the stomach WITH ACTIVE OOZING. OCCASIONAL EROSIONS AND RARE ULCER SEEN IN PROXIMAL SMALL BOWEL . No masses or AVMs SEEN. NO OLD BLOOD OR FRESH BLOOD SEEN.   . POLYPECTOMY N/A 11/24/2014   Procedure: POLYPECTOMY;  Surgeon: Danie Binder, MD;  Location: [facility];  Service: Endoscopy;  Laterality: N/A;  . SAVORY DILATION N/A 05/17/2016   Procedure: SAVORY DILATION;  Surgeon: Danie Binder, MD;  Location: [facility];  Service: Endoscopy;   Laterality: N/A;    Family Psychiatric History: Please see initial evaluation for full details. I have reviewed the history. No updates at this time.     Family History:  Family History  Problem Relation Age of Onset  . Hypertension Mother   . Bauer cancer Neg Hx     Social History:  Social History   Socioeconomic History  . Marital status: Widowed    Spouse name: Not on file  . Number of children: Not on file  . Years of education: Not on file  . Highest education level: Not on file  Occupational History  . Not on file  Social Needs  . Financial resource strain: Not on file  . Food insecurity:    Worry: Not on file    Inability: Not on file  . Transportation needs:    Medical: Not on file    Non-medical: Not on file  Tobacco Use  . Smoking status: Former Smoker    Packs/day: 0.25    Years: 20.00    Pack years: 5.00    Types: Cigarettes    Last attempt to quit: 01/24/1995    Years since quitting: 23.4  . Smokeless tobacco: Never Used  Substance and Sexual Activity  . Alcohol use: No    Alcohol/week: 0.0 standard drinks  . Drug use: No  . Sexual activity: Never    Birth control/protection: Post-menopausal  Lifestyle  . Physical activity:    Days per week: Not on file    Minutes per session: Not on file  . Stress: Not on file  Relationships  . Social connections:    Talks on phone: Not on file    Gets together: Not on file    Attends religious service: Not on file    Active member of club or organization: Not on file    Attends meetings of clubs or organizations: Not on file    Relationship status: Not on file  Other Topics Concern  . Not on file  Social History Narrative  . Not on file    Allergies:  Allergies  Allergen Reactions  . Sulfa Antibiotics Rash  . Sulfonamide Derivatives Rash    REACTION: rash    Metabolic Disorder Labs: Lab Results  Component Value Date   HGBA1C 6.3 (H) 03/15/2018   MPG 134 03/15/2018   MPG 120 11/23/2017   No  results found for: PROLACTIN Lab Results  Component Value Date   CHOL 133 10/07/2016   TRIG 252 (H) 10/07/2016   HDL 28 (L) 10/07/2016   CHOLHDL 4.8 10/07/2016   VLDL 50 (H) 10/07/2016   LDLCALC 55 10/07/2016   LDLCALC (H) 01/03/2009    143        Total Cholesterol/HDL:CHD Risk Coronary Heart Disease Risk Table                     Men   Women  1/2 Average Risk   3.4   3.3  Average Risk       5.0   4.4  2 X Average Risk   9.6   7.1  3 X Average Risk  23.4   11.0        Use the calculated Patient Ratio above and the CHD Risk Table to determine the patient's CHD Risk.        ATP III CLASSIFICATION (LDL):  <100     mg/dL   Optimal  100-129  mg/dL   Near or Above                    Optimal  130-159  mg/dL   Borderline  160-189  mg/dL   High  >190     mg/dL   Very High   Lab Results  Component Value Date   TSH 1.138 03/29/2017   TSH 0.538 02/13/2016    Therapeutic Level Labs: No results found for: LITHIUM No results found for: VALPROATE No components found for:  CBMZ  Current Medications: Current Outpatient Medications  Medication Sig Dispense Refill  . [START ON 07/24/2018] ABILIFY 2 MG tablet Take 1 tablet (2 mg total) by mouth daily. 28 tablet 1  . acetaminophen (TYLENOL) 325 MG tablet Take 650 mg by mouth 3 (three) times daily as needed.    . AMITIZA 8 MCG capsule Take 8 mcg by mouth daily with breakfast.     . calcium carbonate (TUMS - DOSED IN MG ELEMENTAL CALCIUM) 500 MG chewable tablet Chew 1 tablet by mouth 4 (four) times daily as needed for indigestion or heartburn.     . cholecalciferol (VITAMIN D) 1000 units tablet Take 2,000 Units by mouth daily.    . clonazePAM (KLONOPIN) 0.5 MG tablet Take 1 tablet (0.5 mg total) by mouth 3 (three) times daily. 90 tablet 2  . docusate sodium (COLACE) 100 MG capsule Take 200 mg by mouth at bedtime.     . DULoxetine (CYMBALTA) 60 MG capsule Take 120 mg by mouth daily.     Marland Kitchen ENULOSE 10 GM/15ML SOLN Take 20 g by mouth 2 (two)  times daily as needed.     . fluticasone (FLONASE) 50 MCG/ACT nasal spray Place into both nostrils as needed for allergies or rhinitis.    Marland Kitchen gabapentin (NEURONTIN) 300 MG capsule Take 300 mg by mouth 3 (three) times daily.    Marland Kitchen HYDROcodone-acetaminophen (NORCO) 10-325 MG tablet     . HYDROcodone-acetaminophen (NORCO/VICODIN) 5-325 MG tablet 2 (two) times daily.    . hydrocortisone (ANUSOL-HC) 2.5 % rectal cream Place 1 application rectally 2 (two) times daily.    . iron polysaccharides (NIFEREX) 150 MG capsule Take 1 capsule (150 mg total) by mouth daily. 30 capsule 11  . latanoprost (XALATAN) 0.005 % ophthalmic solution Place 1 drop into both eyes at bedtime.    Marland Kitchen LEVEMIR FLEXTOUCH 100 UNIT/ML Pen INJECT 20 UNITS SUBCUTANEOUSLY AT BEDTIME. 15 mL 2  . loperamide (IMODIUM) 2 MG capsule Take 2 mg by mouth. Take 1 capsule by mouth after each loose stool    . metoprolol succinate (TOPROL-XL) 25 MG 24 hr tablet Take 0.5 tablets (12.5 mg total) by mouth daily. (Patient taking differently: Take 12.5 mg by mouth 2 (two) times daily. )    . midodrine (PROAMATINE) 2.5 MG tablet Take 2.5 mg by mouth 3 (three) times daily with meals.     . mirabegron ER (MYRBETRIQ) 25 MG TB24 tablet Take 25 mg by mouth daily.    . mirtazapine (REMERON) 30  MG tablet Take 30 mg by mouth at bedtime.      Marland Kitchen MOVANTIK 12.5 MG TABS tablet TAKE 1 TABLET BY MOUTH ONCE DAILY AS NEEDED FOR CONSTIPATION.HOLD FORDIARRHEA. 30 tablet 5  . omeprazole (PRILOSEC) 40 MG capsule Take 40 mg by mouth daily.    . ondansetron (ZOFRAN) 4 MG tablet Take 1 tablet (4 mg total) by mouth 4 (four) times daily -  before meals and at bedtime. (Patient taking differently: Take 4 mg by mouth as needed. ) 120 tablet 3  . polyethylene glycol powder (GLYCOLAX/MIRALAX) powder Take 17 g by mouth daily. 255 g 0  . polyvinyl alcohol (ARTIFICIAL TEARS) 1.4 % ophthalmic solution Place 1 drop into both eyes 3 (three) times daily.    . risperiDONE (RISPERDAL) 1 MG tablet  Take 1 tablet (1 mg total) by mouth at bedtime. 28 tablet 2  . simvastatin (ZOCOR) 20 MG tablet Take 20 mg by mouth at bedtime.     Marland Kitchen tiZANidine (ZANAFLEX) 2 MG tablet Take 2 mg by mouth daily.    Marland Kitchen warfarin (COUMADIN) 5 MG tablet TAKE 1 TABLET (5mg ) BY MOUTH ONCE DAILY ON TUES,THURS,SAT. TAKE 1/2 TABLET (2.5mg ) ON SUN,MON, WED & FRI. 26 tablet 6  . zolpidem (AMBIEN) 10 MG tablet Take 1 tablet (10 mg total) by mouth at bedtime. Please hold this medication if she has drowsiness 30 tablet 2   No current facility-administered medications for this visit.      Musculoskeletal: Strength & Muscle Tone: N/A Gait & Station: N/A Patient leans: N/A  Psychiatric Specialty Exam: ROS  There were no vitals taken for this visit.There is no height or weight on file to calculate BMI.  General Appearance: NA  Eye Contact:  NA  Speech:  Clear and Coherent  Volume:  Normal  Mood:  Anxious  Affect:  NA  Thought Process:  Coherent  Orientation:  Full (Time, Place, and Person)  Thought Content: Logical   Suicidal Thoughts:  No  Homicidal Thoughts:  No  Memory:  Immediate;   Good  Judgement:  Fair  Insight:  Present  Psychomotor Activity:  Normal  Concentration:  Concentration: Good and Attention Span: Good  Recall:  Good  Fund of Knowledge: Good  Language: Good  Akathisia:  No  Handed:  Right  AIMS (if indicated): not done  Assets:  Social Support  ADL's:  Intact  Cognition: WNL  Sleep:  Good   Screenings: PHQ2-9     Office Visit from 11/28/2017 in [facility] Office Visit from 09/25/2017 in [facility] Office Visit from 03/28/2017 in [facility] Office Visit from 09/13/2016 in [facility] Office Visit from 06/07/2016 in [facility]  PHQ-2 Total Score  0  0  0  0  0       Assessment and Plan:  [name] is a 73 y.o. year old female with a history of schizophrenia,  depression, SVT, DVT, history of stroke on warfarin, type II diabetes, hypertension, GERD , who presents for follow up appointment for Schizophrenia, unspecified type ([place]) - Plan: zolpidem (AMBIEN) 10 MG tablet, ABILIFY 2 MG tablet  # Schizophrenia by history # MDD The patient demonstrates linear thought process, which is consistent with the initial evaluation.  Will continue duloxetine and mirtazapine to target depression and anxiety.  Will continue Abilify as adjunctive treatment for depression.  She did have worsening in depressive symptoms after tapering off Risperdal; will continue this medication at this time to avoid  relapse in her symptoms, although it is preferable to avoid using two antipsychotics.  Noted that although she does have history of schizophrenia, she denies any hallucinations or history consistent with schizophrenia.  She is on clonazepam as needed for anxiety, prescribed by her neurologist.  Will continue Ambien as needed for insomnia; will plan to taper it off in the future.   # Cognitive deficits Although she does have cognitive deficits, which is characterized in [place], she is oriented on today's evaluation. Will continue to assess as needed.   Plan I have reviewed and updated plans as below 1. Continue duloxetine 120 mg daily 2. Continue Mirtazapine 30 mg at night 3. Continue Abilify 2 mg daily 4. Continue Risperidone 1 mg at night  5. Continue Zolpidem 10 mg at night  6.Continueclonazepam 0.5 mg twice a day for anxiety (prescribed by Dr. Merlene Laughter 7. Next appointment: 8/25 at 9 AM for 29 mins, phone 8. Obtain record from Dr. Peggye Form- pending  The patient demonstrates the following risk factors for suicide: Chronic risk factors for suicide include:psychiatric disorder ofschizophrenia by history, depression. Acute risk factorsfor suicide include: unemployment. Protective factorsfor this patient include: positive social support and hope for the future.  Considering these factors, the overall suicide risk at this point appears to below. Patientisappropriate for outpatient follow up.  [name], MD 06/25/2018, 1:37 PM

## 2018-06-19 DIAGNOSIS — I69354 Hemiplegia and hemiparesis following cerebral infarction affecting left non-dominant side: Secondary | ICD-10-CM | POA: Diagnosis not present

## 2018-06-19 DIAGNOSIS — M5002 Cervical disc disorder with myelopathy, mid-cervical region, unspecified level: Secondary | ICD-10-CM | POA: Diagnosis not present

## 2018-06-19 DIAGNOSIS — I671 Cerebral aneurysm, nonruptured: Secondary | ICD-10-CM | POA: Diagnosis not present

## 2018-06-19 DIAGNOSIS — M545 Low back pain: Secondary | ICD-10-CM | POA: Diagnosis not present

## 2018-06-20 ENCOUNTER — Other Ambulatory Visit (HOSPITAL_COMMUNITY): Payer: Self-pay | Admitting: Neurology

## 2018-06-20 ENCOUNTER — Other Ambulatory Visit: Payer: Self-pay | Admitting: Neurology

## 2018-06-20 DIAGNOSIS — G959 Disease of spinal cord, unspecified: Secondary | ICD-10-CM

## 2018-06-25 ENCOUNTER — Ambulatory Visit (INDEPENDENT_AMBULATORY_CARE_PROVIDER_SITE_OTHER): Payer: Medicare Other | Admitting: Psychiatry

## 2018-06-25 ENCOUNTER — Other Ambulatory Visit: Payer: Self-pay

## 2018-06-25 ENCOUNTER — Encounter (HOSPITAL_COMMUNITY): Payer: Self-pay | Admitting: Psychiatry

## 2018-06-25 DIAGNOSIS — F209 Schizophrenia, unspecified: Secondary | ICD-10-CM

## 2018-06-25 MED ORDER — ZOLPIDEM TARTRATE 10 MG PO TABS: 10.0000 mg | ORAL_TABLET | Freq: Every day | ORAL | 2 refills | Status: DC

## 2018-06-25 MED ORDER — ABILIFY 2 MG PO TABS: 2.0000 mg | ORAL_TABLET | Freq: Every day | ORAL | 1 refills | Status: DC

## 2018-06-25 MED ORDER — RISPERIDONE 1 MG PO TABS: 1.0000 mg | ORAL_TABLET | Freq: Every day | ORAL | 2 refills | Status: DC

## 2018-06-25 NOTE — Patient Instructions (Signed)
1. Continue duloxetine 120 mg daily 2. Continue Mirtazapine 30 mg at night 3. Continue Abilify 2 mg daily 4. Continue Risperidone 1 mg at night  5. Continue Zolpidem 10 mg at night  6. Next appointment: 8/25 at 9 AM

## 2018-06-25 NOTE — Addendum Note (Signed)
Addended by: [name] on: 06/25/2018 01:37 PM   Modules accepted: Orders

## 2018-06-26 ENCOUNTER — Telehealth: Payer: Self-pay | Admitting: *Deleted

## 2018-06-26 NOTE — Telephone Encounter (Signed)

## 2018-06-27 ENCOUNTER — Ambulatory Visit (INDEPENDENT_AMBULATORY_CARE_PROVIDER_SITE_OTHER): Payer: Medicare Other | Admitting: *Deleted

## 2018-06-27 DIAGNOSIS — Z8679 Personal history of other diseases of the circulatory system: Secondary | ICD-10-CM

## 2018-06-27 DIAGNOSIS — I82409 Acute embolism and thrombosis of unspecified deep veins of unspecified lower extremity: Secondary | ICD-10-CM

## 2018-06-27 DIAGNOSIS — E1159 Type 2 diabetes mellitus with other circulatory complications: Secondary | ICD-10-CM | POA: Diagnosis not present

## 2018-06-27 DIAGNOSIS — Z5181 Encounter for therapeutic drug level monitoring: Secondary | ICD-10-CM | POA: Diagnosis not present

## 2018-06-27 LAB — POCT INR: INR: 2.3 (ref 2.0–3.0)

## 2018-06-27 NOTE — Patient Instructions (Signed)
Continue coumadin 1/2 tablets daily except 1 tablet on Tuesdays, Thursdays and Saturdays Recheck in 6 weeks

## 2018-06-28 LAB — HEMOGLOBIN A1C
Hgb A1c MFr Bld: 7.2 % of total Hgb — ABNORMAL HIGH (ref <5.7)
Mean Plasma Glucose: 160 (calc)
eAG (mmol/L): 8.9 (calc)

## 2018-06-28 LAB — COMPLETE METABOLIC PANEL WITH GFR
AG Ratio: 1.4 (calc) (ref 1.0–2.5)
ALT: 4 U/L — ABNORMAL LOW (ref 6–29)
AST: 7 U/L — ABNORMAL LOW (ref 10–35)
Albumin: 3.8 g/dL (ref 3.6–5.1)
Alkaline phosphatase (APISO): 92 U/L (ref 37–153)
BUN: 9 mg/dL (ref 7–25)
CO2: 31 mmol/L (ref 20–32)
Calcium: 8.9 mg/dL (ref 8.6–10.4)
Chloride: 100 mmol/L (ref 98–110)
Creat: 0.6 mg/dL (ref 0.60–0.93)
GFR, Est African American: 106 mL/min/{1.73_m2} (ref >=60)
GFR, Est Non African American: 91 mL/min/{1.73_m2} (ref >=60)
Globulin: 2.7 g/dL (calc) (ref 1.9–3.7)
Glucose, Bld: 208 mg/dL — ABNORMAL HIGH (ref 65–99)
Potassium: 4.1 mmol/L (ref 3.5–5.3)
Sodium: 139 mmol/L (ref 135–146)
Total Bilirubin: 0.3 mg/dL (ref 0.2–1.2)
Total Protein: 6.5 g/dL (ref 6.1–8.1)

## 2018-07-03 ENCOUNTER — Other Ambulatory Visit: Payer: Self-pay

## 2018-07-03 ENCOUNTER — Ambulatory Visit (INDEPENDENT_AMBULATORY_CARE_PROVIDER_SITE_OTHER): Payer: Medicare Other | Admitting: "Endocrinology

## 2018-07-03 ENCOUNTER — Encounter: Payer: Self-pay | Admitting: "Endocrinology

## 2018-07-03 DIAGNOSIS — I10 Essential (primary) hypertension: Secondary | ICD-10-CM | POA: Diagnosis not present

## 2018-07-03 DIAGNOSIS — E782 Mixed hyperlipidemia: Secondary | ICD-10-CM

## 2018-07-03 DIAGNOSIS — E1159 Type 2 diabetes mellitus with other circulatory complications: Secondary | ICD-10-CM

## 2018-07-03 MED ORDER — INSULIN DETEMIR 100 UNIT/ML FLEXPEN: 25.0000 [IU] | PEN_INJECTOR | Freq: Every day | SUBCUTANEOUS | 2 refills | Status: DC

## 2018-07-03 NOTE — Progress Notes (Signed)
07/03/2018                                                    Endocrinology Telehealth Visit Follow up Note -During COVID -19 Pandemic  This visit type was conducted due to national recommendations for restrictions regarding the COVID-19 Pandemic  in an effort to limit this patient's exposure and mitigate transmission of the corona virus.  Due to her co-morbid illnesses, [name] is at  moderate to high risk for complications without adequate follow up.  This format is felt to be most appropriate for her at this time.  I connected with this patient on 07/03/2018   by telephone and verified that I am speaking with the correct person using two identifiers. [name], [birth year]. she has verbally consented to this visit. All issues noted in this document were discussed and addressed. The format was not optimal for physical exam.   -She is seen along with her caretaker Tammy in the nursing home.  Subjective:    Patient ID: [name], female    DOB: 09-23-1945,    Past Medical History:  Diagnosis Date  . Anxiety   . Arthritis   . Dependence on wheelchair    pivot/transfers  . Depression    History of psychosis and previous suicide attempt  . DVT, lower extremity, recurrent (HCC)    Long-term Coumadin per Dr. Legrand Rams  . Essential hypertension   . GERD (gastroesophageal reflux disease)   . Hemiplegia ([place]) 2010   Left side  . History of stroke    Acute infarct and right cerebral white matter small vessel disease 12/10  . Leg DVT (deep venous thromboembolism), acute ([place]) 2006  . Schizophrenia ([place])   . Stroke [facility])    left sided weakness  . Type 2 diabetes mellitus ([place])    Past Surgical History:  Procedure Laterality Date  . BACK SURGERY    . BIOPSY N/A 11/24/2014   Procedure: BIOPSY;  Surgeon: Danie Binder, MD;  Location: [facility];  Service: Endoscopy;  Laterality: N/A;  . BIOPSY  05/17/2016   Procedure: BIOPSY;  Surgeon: Danie Binder, MD;  Location: [facility];   Service: Endoscopy;;  gastric biopsy  . COLONOSCOPY WITH PROPOFOL N/A 11/24/2014   Dr. Rudie Meyer polyps removed/moderate sized internal hemorrhoids, tubular adenomas. Next surveillance in 3 years  . ESOPHAGOGASTRODUODENOSCOPY (EGD) WITH PROPOFOL N/A 11/24/2014   Dr. Clayburn Pert HH/patent stricture at the gastroesophageal junction, mild non-erosive gastritis, path negative for H.pylori or celiac sprue  . ESOPHAGOGASTRODUODENOSCOPY (EGD) WITH PROPOFOL N/A 05/17/2016   Procedure: ESOPHAGOGASTRODUODENOSCOPY (EGD) WITH PROPOFOL;  Surgeon: Danie Binder, MD;  Location: [facility];  Service: Endoscopy;  Laterality: N/A;  12:45pm  . GIVENS CAPSULE STUDY N/A 12/11/2014   MULTILPLE EROSION IN the stomach WITH ACTIVE OOZING. OCCASIONAL EROSIONS AND RARE ULCER SEEN IN PROXIMAL SMALL BOWEL . No masses or AVMs SEEN. NO OLD BLOOD OR FRESH BLOOD SEEN.   . POLYPECTOMY N/A 11/24/2014   Procedure: POLYPECTOMY;  Surgeon: Danie Binder, MD;  Location: [facility];  Service: Endoscopy;  Laterality: N/A;  . SAVORY DILATION N/A 05/17/2016   Procedure: SAVORY DILATION;  Surgeon: Danie Binder, MD;  Location: [facility];  Service: Endoscopy;  Laterality: N/A;   Family History  Problem Relation Age of Onset  . Hypertension  Mother   . Colon cancer Neg Hx     Social History   Socioeconomic History  . Marital status: Widowed    Spouse name: Not on file  . Number of children: Not on file  . Years of education: Not on file  . Highest education level: Not on file  Occupational History  . Not on file  Social Needs  . Financial resource strain: Not on file  . Food insecurity:    Worry: Not on file    Inability: Not on file  . Transportation needs:    Medical: Not on file    Non-medical: Not on file  Tobacco Use  . Smoking status: Former Smoker    Packs/day: 0.25    Years: 20.00    Pack years: 5.00    Types: Cigarettes    Last attempt to quit: 01/24/1995    Years since quitting: 23.4  . Smokeless tobacco:  Never Used  Substance and Sexual Activity  . Alcohol use: No    Alcohol/week: 0.0 standard drinks  . Drug use: No  . Sexual activity: Never    Birth control/protection: Post-menopausal  Lifestyle  . Physical activity:    Days per week: Not on file    Minutes per session: Not on file  . Stress: Not on file  Relationships  . Social connections:    Talks on phone: Not on file    Gets together: Not on file    Attends religious service: Not on file    Active member of club or organization: Not on file    Attends meetings of clubs or organizations: Not on file    Relationship status: Not on file  Other Topics Concern  . Not on file  Social History Narrative  . Not on file   Outpatient Encounter Medications as of 07/03/2018  Medication Sig  . [START ON 07/24/2018] ABILIFY 2 MG tablet Take 1 tablet (2 mg total) by mouth daily.  Marland Kitchen acetaminophen (TYLENOL) 325 MG tablet Take 650 mg by mouth 3 (three) times daily as needed.  . AMITIZA 8 MCG capsule Take 8 mcg by mouth daily with breakfast.   . calcium carbonate (TUMS - DOSED IN MG ELEMENTAL CALCIUM) 500 MG chewable tablet Chew 1 tablet by mouth 4 (four) times daily as needed for indigestion or heartburn.   . cholecalciferol (VITAMIN D) 1000 units tablet Take 2,000 Units by mouth daily.  . clonazePAM (KLONOPIN) 0.5 MG tablet Take 1 tablet (0.5 mg total) by mouth 3 (three) times daily.  Marland Kitchen docusate sodium (COLACE) 100 MG capsule Take 200 mg by mouth at bedtime.   . DULoxetine (CYMBALTA) 60 MG capsule Take 120 mg by mouth daily.   Marland Kitchen ENULOSE 10 GM/15ML SOLN Take 20 g by mouth 2 (two) times daily as needed.   . fluticasone (FLONASE) 50 MCG/ACT nasal spray Place into both nostrils as needed for allergies or rhinitis.  Marland Kitchen gabapentin (NEURONTIN) 300 MG capsule Take 300 mg by mouth 3 (three) times daily.  Marland Kitchen HYDROcodone-acetaminophen (NORCO) 10-325 MG tablet   . HYDROcodone-acetaminophen (NORCO/VICODIN) 5-325 MG tablet 2 (two) times daily.  .  hydrocortisone (ANUSOL-HC) 2.5 % rectal cream Place 1 application rectally 2 (two) times daily.  . Insulin Detemir (LEVEMIR FLEXTOUCH) 100 UNIT/ML Pen Inject 25 Units into the skin at bedtime.  . iron polysaccharides (NIFEREX) 150 MG capsule Take 1 capsule (150 mg total) by mouth daily.  Marland Kitchen latanoprost (XALATAN) 0.005 % ophthalmic solution Place 1 drop into both eyes at  bedtime.  Marland Kitchen loperamide (IMODIUM) 2 MG capsule Take 2 mg by mouth. Take 1 capsule by mouth after each loose stool  . metoprolol succinate (TOPROL-XL) 25 MG 24 hr tablet Take 0.5 tablets (12.5 mg total) by mouth daily. (Patient taking differently: Take 12.5 mg by mouth 2 (two) times daily. )  . midodrine (PROAMATINE) 2.5 MG tablet Take 2.5 mg by mouth 3 (three) times daily with meals.   . mirabegron ER (MYRBETRIQ) 25 MG TB24 tablet Take 25 mg by mouth daily.  . mirtazapine (REMERON) 30 MG tablet Take 30 mg by mouth at bedtime.    Marland Kitchen MOVANTIK 12.5 MG TABS tablet TAKE 1 TABLET BY MOUTH ONCE DAILY AS NEEDED FOR CONSTIPATION.HOLD FORDIARRHEA.  Marland Kitchen omeprazole (PRILOSEC) 40 MG capsule Take 40 mg by mouth daily.  . ondansetron (ZOFRAN) 4 MG tablet Take 1 tablet (4 mg total) by mouth 4 (four) times daily -  before meals and at bedtime. (Patient taking differently: Take 4 mg by mouth as needed. )  . polyethylene glycol powder (GLYCOLAX/MIRALAX) powder Take 17 g by mouth daily.  . polyvinyl alcohol (ARTIFICIAL TEARS) 1.4 % ophthalmic solution Place 1 drop into both eyes 3 (three) times daily.  . risperiDONE (RISPERDAL) 1 MG tablet Take 1 tablet (1 mg total) by mouth at bedtime.  . simvastatin (ZOCOR) 20 MG tablet Take 20 mg by mouth at bedtime.   Marland Kitchen tiZANidine (ZANAFLEX) 2 MG tablet Take 2 mg by mouth daily.  Marland Kitchen warfarin (COUMADIN) 5 MG tablet TAKE 1 TABLET (5mg ) BY MOUTH ONCE DAILY ON TUES,THURS,SAT. TAKE 1/2 TABLET (2.5mg ) ON SUN,MON, WED & FRI.  Marland Kitchen zolpidem (AMBIEN) 10 MG tablet Take 1 tablet (10 mg total) by mouth at bedtime. Please hold this  medication if she has drowsiness  . [DISCONTINUED] LEVEMIR FLEXTOUCH 100 UNIT/ML Pen INJECT 20 UNITS SUBCUTANEOUSLY AT BEDTIME.   No facility-administered encounter medications on file as of 07/03/2018.    ALLERGIES: Allergies  Allergen Reactions  . Sulfa Antibiotics Rash  . Sulfonamide Derivatives Rash    REACTION: rash   VACCINATION STATUS: There is no immunization history for the selected administration types on file for this patient.  Diabetes  She presents for her follow-up diabetic visit. She has type 2 diabetes mellitus. Onset time: She was diagnosed at approximate age of 37 years. Her disease course has been stable. There are no hypoglycemic associated symptoms. Pertinent negatives for hypoglycemia include no confusion, pallor or seizures. Pertinent negatives for diabetes include no polydipsia, no polyphagia and no polyuria. There are no hypoglycemic complications. Symptoms are stable. Diabetic complications include a CVA. Risk factors for coronary artery disease include dyslipidemia, diabetes mellitus, obesity, sedentary lifestyle and hypertension. Current diabetic treatment includes intensive insulin program and oral agent (monotherapy) (She kept requiring large dose of insulin due to consumption of large quantities of processed carbohydrates.Marland Kitchen). Her weight is stable. She is following a generally unhealthy diet. When asked about meal planning, she reported none. She never participates in exercise. Her home blood glucose trend is decreasing steadily. Her breakfast blood glucose range is generally 130-140 mg/dl. Her overall blood glucose range is 130-140 mg/dl. (-Her recent A1c of 6.3%.  Off of NovoLog, she returns with near target glycemic profile only on Levemir.) An ACE inhibitor/angiotensin II receptor blocker is being taken.  Hypertension  This is a chronic problem. The current episode started more than 1 year ago. The problem is uncontrolled (Was found to have hypotension of 77/48,  repeat 113/67.). Pertinent negatives include no palpitations or  shortness of breath. Risk factors for coronary artery disease include dyslipidemia and diabetes mellitus. Past treatments include beta blockers (She is currently on a low-dose metoprolol.). Hypertensive end-organ damage includes CVA.  Hyperlipidemia  This is a chronic problem. The current episode started more than 1 year ago. Exacerbating diseases include diabetes. Pertinent negatives include no shortness of breath. Risk factors for coronary artery disease include diabetes mellitus, dyslipidemia, hypertension, obesity and a sedentary lifestyle.    Review of Systems  Constitutional: Negative for unexpected weight change.       Uses walker to get around.   HENT: Negative for trouble swallowing and voice change.   Eyes: Negative for visual disturbance.  Respiratory: Negative for shortness of breath and wheezing.   Cardiovascular: Negative for palpitations and leg swelling.  Gastrointestinal: Negative for diarrhea.  Endocrine: Negative for cold intolerance, heat intolerance, polydipsia, polyphagia and polyuria.  Skin: Negative for color change, pallor and wound.  Neurological: Negative for seizures.  Psychiatric/Behavioral: Negative for confusion and suicidal ideas.    Objective:    There were no vitals taken for this visit.  Wt Readings from Last 3 Encounters:  03/29/18 139 lb (63 kg)  01/21/18 137 lb 9.6 oz (62.4 kg)  07/06/17 151 lb (68.5 kg)     Results for orders placed or performed in visit on 06/27/18  POCT INR  Result Value Ref Range   INR 2.3 2.0 - 3.0   Complete Blood Count (Most recent): Lab Results  Component Value Date   WBC 8.7 03/25/2018   HGB 12.3 03/25/2018   HCT 36.0 03/25/2018   MCV 88.5 03/25/2018   PLT 357 03/25/2018   Lab Results  Component Value Date   NA 139 06/27/2018   K 4.1 06/27/2018   CL 100 06/27/2018   CO2 31 06/27/2018   BUN 9 06/27/2018   CREATININE 0.60 06/27/2018    Diabetic Labs (most recent): Lab Results  Component Value Date   HGBA1C 7.2 (H) 06/27/2018   HGBA1C 6.3 (H) 03/15/2018   HGBA1C 5.8 (H) 11/23/2017   Lipid Panel     Component Value Date/Time   CHOL 133 10/07/2016 0620   TRIG 252 (H) 10/07/2016 0620   HDL 28 (L) 10/07/2016 0620   CHOLHDL 4.8 10/07/2016 0620   VLDL 50 (H) 10/07/2016 0620   LDLCALC 55 10/07/2016 0620     Assessment & Plan:   1. Type 2 diabetes mellitus with vascular disease ([place]) Her diabetes is  complicated by recurrent CVA. She was seen with her caretaker Tammy, reporting near target glycemia and A1c was 7.2%.    - Patient remains at a high risk for more acute and chronic complications of diabetes which include CAD, CVA, CKD, retinopathy, and neuropathy. These are all discussed in detail with the patient.  - I have re-counseled the patient on diet management and weight loss  by adopting a carbohydrate restricted / protein rich  Diet.  - Patient is advised to stick to a routine mealtimes to eat 3 meals  a day and avoid unnecessary snacks ( to snack only to correct hypoglycemia).  - I have approached patient with the following individualized plan to manage diabetes and patient agrees.   -Due to her unpredictable food intake, she is advised to continue to hold NovoLog for now.   -I have advised to increase her Levemir to 25 units nightly, monitor blood glucose 2 times a day-daily before breakfast and at bedtime.   -Patient is encouraged to call clinic for blood  glucose levels less than 70 or above 300 mg /dl.  - she is not a suitable candidate for metformin nor SGLT2 inhibitor therapy.   - Patient specific target  for A1c; LDL, HDL, Triglycerides, and  Waist Circumference were discussed in detail.  2) BP/HTN: she is advised to home monitor blood pressure and report if > 140/90 on 2 separate readings.   She is only on a low-dose metoprolol.  Her blood pressure needs follow-up.   3) Lipids/HPL: Her recent  lipid panel from September 2018 showed LDL of 55.  She is advised to continue her current simvastatin 20 mg p.o. nightly.    4)  Weight/Diet:  exercise, and carbohydrates information provided.  5) Chronic Care/Health Maintenance:  -Continue to need one-on-one assistance at mealtimes as well as diligent hydration.  Patient needs to keep her appointment with Ophthalmology, Podiatrist at least yearly or according to recommendations, and advised to  stay away from smoking. I have recommended yearly flu vaccine and pneumonia vaccination at least every 5 years; and  sleep for at least 7 hours a day.  I advised patient to maintain close follow up with her PCP for primary care needs.  - Patient Care Time Today:  25 min, of which >50% was spent in reviewing her  current and  previous labs/studies, previous treatments, and medications doses and developing a plan for long-term care based on the latest recommendations for standards of care.  [name] participated in the discussions, expressed understanding, and voiced agreement with the above plans.  All questions were answered to her satisfaction. she is encouraged to contact clinic should she have any questions or concerns prior to her return visit.  Follow up plan: Return in about 4 months (around 11/02/2018) for Follow up with Pre-visit Labs, Meter, and Logs.  Glade Lloyd, MD Phone: [phone]  Fax: [phone]   This note was partially dictated with voice recognition software. Similar sounding words can be transcribed inadequately or may not  be corrected upon review.  07/03/2018, 4:13 PM

## 2018-07-05 ENCOUNTER — Other Ambulatory Visit: Payer: Self-pay

## 2018-07-05 ENCOUNTER — Ambulatory Visit (HOSPITAL_COMMUNITY)
Admission: RE | Admit: 2018-07-05 | Discharge: 2018-07-05 | Disposition: A | Discharge status: Home / Self Care | Payer: Medicare Other | Source: Ambulatory Visit | Attending: Neurology | Admitting: Neurology

## 2018-07-05 DIAGNOSIS — G959 Disease of spinal cord, unspecified: Secondary | ICD-10-CM | POA: Insufficient documentation

## 2018-07-05 DIAGNOSIS — M542 Cervicalgia: Secondary | ICD-10-CM | POA: Diagnosis not present

## 2018-07-10 ENCOUNTER — Encounter (HOSPITAL_COMMUNITY)
Admission: RE | Admit: 2018-07-10 | Discharge: 2018-07-10 | Disposition: A | Discharge status: Home / Self Care | Payer: Medicare Other | Source: Ambulatory Visit | Attending: Neurology | Admitting: Neurology

## 2018-07-10 ENCOUNTER — Other Ambulatory Visit: Payer: Self-pay

## 2018-07-10 ENCOUNTER — Encounter (HOSPITAL_COMMUNITY): Payer: Self-pay

## 2018-07-10 DIAGNOSIS — M5002 Cervical disc disorder with myelopathy, mid-cervical region, unspecified level: Secondary | ICD-10-CM | POA: Insufficient documentation

## 2018-07-10 MED ORDER — SODIUM CHLORIDE 0.9 % IV SOLN
1000.0000 mg | Freq: Every day | INTRAVENOUS | Status: DC
  Administered 2018-07-10: 1000 mg via INTRAVENOUS
  Filled 2018-07-10: qty 8

## 2018-07-10 MED ORDER — SODIUM CHLORIDE 0.9 % IV SOLN
Freq: Every day | INTRAVENOUS | Status: DC
  Administered 2018-07-10: 10:00:00 via INTRAVENOUS

## 2018-07-10 NOTE — Progress Notes (Signed)
Report called to [facility] [place]

## 2018-07-11 ENCOUNTER — Encounter (HOSPITAL_COMMUNITY)
Admission: RE | Admit: 2018-07-11 | Discharge: 2018-07-11 | Disposition: A | Discharge status: Home / Self Care | Payer: Medicare Other | Source: Ambulatory Visit | Attending: Neurology | Admitting: Neurology

## 2018-07-11 DIAGNOSIS — M5002 Cervical disc disorder with myelopathy, mid-cervical region, unspecified level: Secondary | ICD-10-CM | POA: Diagnosis not present

## 2018-07-11 MED ORDER — SODIUM CHLORIDE 0.9 % IV SOLN
Freq: Once | INTRAVENOUS | Status: AC
  Administered 2018-07-11: 10:00:00 via INTRAVENOUS

## 2018-07-11 MED ORDER — SODIUM CHLORIDE 0.9 % IV SOLN
1000.0000 mg | Freq: Once | INTRAVENOUS | Status: AC
  Administered 2018-07-11: 1000 mg via INTRAVENOUS
  Filled 2018-07-11: qty 8

## 2018-07-12 ENCOUNTER — Encounter (HOSPITAL_COMMUNITY): Payer: Self-pay

## 2018-07-12 ENCOUNTER — Encounter (HOSPITAL_COMMUNITY)
Admission: RE | Admit: 2018-07-12 | Discharge: 2018-07-12 | Disposition: A | Discharge status: Home / Self Care | Payer: Medicare Other | Source: Ambulatory Visit | Attending: Neurology | Admitting: Neurology

## 2018-07-12 ENCOUNTER — Other Ambulatory Visit: Payer: Self-pay

## 2018-07-12 DIAGNOSIS — M5002 Cervical disc disorder with myelopathy, mid-cervical region, unspecified level: Secondary | ICD-10-CM | POA: Diagnosis not present

## 2018-07-12 MED ORDER — SODIUM CHLORIDE 0.9 % IV SOLN
Freq: Once | INTRAVENOUS | Status: AC
  Administered 2018-07-12: 11:00:00 via INTRAVENOUS

## 2018-07-12 MED ORDER — SODIUM CHLORIDE 0.9 % IV SOLN
1000.0000 mg | Freq: Once | INTRAVENOUS | Status: AC
  Administered 2018-07-12: 1000 mg via INTRAVENOUS
  Filled 2018-07-12: qty 8

## 2018-07-16 DIAGNOSIS — G8190 Hemiplegia, unspecified affecting unspecified side: Secondary | ICD-10-CM | POA: Diagnosis not present

## 2018-07-16 DIAGNOSIS — I10 Essential (primary) hypertension: Secondary | ICD-10-CM | POA: Diagnosis not present

## 2018-07-22 ENCOUNTER — Ambulatory Visit: Payer: Medicare Other | Admitting: Nurse Practitioner

## 2018-08-05 ENCOUNTER — Other Ambulatory Visit (HOSPITAL_COMMUNITY): Payer: Self-pay | Admitting: Neurosurgery

## 2018-08-05 ENCOUNTER — Other Ambulatory Visit: Payer: Self-pay | Admitting: Neurosurgery

## 2018-08-05 DIAGNOSIS — M4802 Spinal stenosis, cervical region: Secondary | ICD-10-CM | POA: Diagnosis not present

## 2018-08-05 DIAGNOSIS — M21372 Foot drop, left foot: Secondary | ICD-10-CM | POA: Diagnosis not present

## 2018-08-07 ENCOUNTER — Ambulatory Visit (INDEPENDENT_AMBULATORY_CARE_PROVIDER_SITE_OTHER): Payer: Medicare Other | Admitting: *Deleted

## 2018-08-07 DIAGNOSIS — Z5181 Encounter for therapeutic drug level monitoring: Secondary | ICD-10-CM

## 2018-08-07 DIAGNOSIS — Z8679 Personal history of other diseases of the circulatory system: Secondary | ICD-10-CM

## 2018-08-07 DIAGNOSIS — I82409 Acute embolism and thrombosis of unspecified deep veins of unspecified lower extremity: Secondary | ICD-10-CM

## 2018-08-07 LAB — POCT INR: INR: 2.4 (ref 2.0–3.0)

## 2018-08-07 NOTE — Patient Instructions (Signed)
Continue coumadin 1/2 tablets daily except 1 tablet on Tuesdays, Thursdays and Saturdays Recheck in 6 weeks

## 2018-08-08 DIAGNOSIS — B351 Tinea unguium: Secondary | ICD-10-CM | POA: Diagnosis not present

## 2018-08-08 DIAGNOSIS — M79675 Pain in left toe(s): Secondary | ICD-10-CM | POA: Diagnosis not present

## 2018-08-08 DIAGNOSIS — M79674 Pain in right toe(s): Secondary | ICD-10-CM | POA: Diagnosis not present

## 2018-08-09 DIAGNOSIS — Z794 Long term (current) use of insulin: Secondary | ICD-10-CM | POA: Diagnosis not present

## 2018-08-09 DIAGNOSIS — Z7984 Long term (current) use of oral hypoglycemic drugs: Secondary | ICD-10-CM | POA: Diagnosis not present

## 2018-08-09 DIAGNOSIS — E119 Type 2 diabetes mellitus without complications: Secondary | ICD-10-CM | POA: Diagnosis not present

## 2018-08-09 DIAGNOSIS — H25813 Combined forms of age-related cataract, bilateral: Secondary | ICD-10-CM | POA: Diagnosis not present

## 2018-08-13 ENCOUNTER — Ambulatory Visit (HOSPITAL_COMMUNITY)
Admission: RE | Admit: 2018-08-13 | Discharge: 2018-08-13 | Disposition: A | Discharge status: Home / Self Care | Payer: Medicare Other | Source: Ambulatory Visit | Attending: Neurosurgery | Admitting: Neurosurgery

## 2018-08-13 ENCOUNTER — Other Ambulatory Visit: Payer: Self-pay

## 2018-08-13 DIAGNOSIS — M545 Low back pain: Secondary | ICD-10-CM | POA: Diagnosis not present

## 2018-08-13 DIAGNOSIS — M21372 Foot drop, left foot: Secondary | ICD-10-CM | POA: Diagnosis not present

## 2018-08-19 DIAGNOSIS — I671 Cerebral aneurysm, nonruptured: Secondary | ICD-10-CM | POA: Diagnosis not present

## 2018-08-19 DIAGNOSIS — M545 Low back pain: Secondary | ICD-10-CM | POA: Diagnosis not present

## 2018-08-19 DIAGNOSIS — I69354 Hemiplegia and hemiparesis following cerebral infarction affecting left non-dominant side: Secondary | ICD-10-CM | POA: Diagnosis not present

## 2018-08-19 DIAGNOSIS — M961 Postlaminectomy syndrome, not elsewhere classified: Secondary | ICD-10-CM | POA: Diagnosis not present

## 2018-08-19 DIAGNOSIS — G894 Chronic pain syndrome: Secondary | ICD-10-CM | POA: Diagnosis not present

## 2018-08-26 DIAGNOSIS — M4316 Spondylolisthesis, lumbar region: Secondary | ICD-10-CM | POA: Diagnosis not present

## 2018-08-26 DIAGNOSIS — M48061 Spinal stenosis, lumbar region without neurogenic claudication: Secondary | ICD-10-CM | POA: Diagnosis not present

## 2018-09-09 ENCOUNTER — Encounter: Payer: Self-pay | Admitting: Nurse Practitioner

## 2018-09-09 ENCOUNTER — Other Ambulatory Visit: Payer: Self-pay

## 2018-09-09 ENCOUNTER — Ambulatory Visit (INDEPENDENT_AMBULATORY_CARE_PROVIDER_SITE_OTHER): Payer: Medicare Other | Admitting: Nurse Practitioner

## 2018-09-09 VITALS — BP 114/61 | HR 64 | Temp 97.5°F | Ht 65.0 in

## 2018-09-09 DIAGNOSIS — R197 Diarrhea, unspecified: Secondary | ICD-10-CM

## 2018-09-09 DIAGNOSIS — K5900 Constipation, unspecified: Secondary | ICD-10-CM | POA: Diagnosis not present

## 2018-09-09 MED ORDER — LUBIPROSTONE 24 MCG PO CAPS: 24.0000 ug | ORAL_CAPSULE | Freq: Two times a day (BID) | ORAL | 3 refills | Status: DC

## 2018-09-09 NOTE — Assessment & Plan Note (Signed)
Resolved at this time.  Continue to monitor and follow-up in 2 months.

## 2018-09-09 NOTE — Progress Notes (Signed)
Referring Provider: [name], MD Primary Care Physician:  [name], MD Primary GI:  Dr. Oneida Alar  Chief Complaint  Patient presents with  . Constipation    BM twice a week and will have blood when she wipes    HPI:   [name] is a 73 y.o. female who presents for follow-up on constipation and diarrhea.  The patient was last seen in our office 01/21/2018 for IBS constipation and diarrhea.  Noted history of IDA.  Colonoscopy/EGD/capsule study completed November 2016 and felt IDA related to erosive gastropathy and enteropathy and recommended repeat colonoscopy in 2019.  History of right lobe liver hemangioma on imaging.  Repeat EGD in 2018 with benign appearing esophageal stenosis, small hiatal hernia, chronic gastritis negative for H. pylori.  Symptoms consistent with IBS mixed type and her regimen was Amitiza/Colace/MiraLAX for constipation and Imodium for diarrhea.  Her last visit she had persistent diarrhea and states "they do not give me anything for it."  Facility staff accompanying patient is not a medication tech and is not sure about the veracity of this.  Occasional fecal urgency and incontinence.  Bowel movement 3 times a day described as "real soft, sometimes diarrhea."  No other GI complaints.  Recommended continue Amitiza 8 mcg and Colace 200 mg every day.  Change MiraLAX to 1 dose, as needed for worsening constipation.  Continue Imodium as needed for diarrhea.  Follow-up in 6 months.  Today she states she's doing somehwat worse today. Having more significant constipation with a bowel movement about twice a week. States her facility took her off of one of her constipation medications. Currently on Amitiza 8 mcg bid, stool softener, and MiraLAX prn. Has hemorrhoid symptoms currently with rectal discomfort and toilet tissue hematochezia. Denies abdominal pain, N/V, fever, chills, melena. States she's lost about 20 lbs in 6-9 months. Her appetite isn't great but takes  Ensure supplements. Denies URI or flu-like symptoms. Denies loss of sense of taste or smell. Denies chest pain, dyspnea, dizziness, lightheadedness, syncope, near syncope. Denies any other upper or lower GI symptoms.  Past Medical History:  Diagnosis Date  . Anxiety   . Arthritis   . Dependence on wheelchair    pivot/transfers  . Depression    History of psychosis and previous suicide attempt  . DVT, lower extremity, recurrent (HCC)    Long-term Coumadin per Dr. Legrand Rams  . Essential hypertension   . GERD (gastroesophageal reflux disease)   . Hemiplegia ([place]) 2010   Left side  . History of stroke    Acute infarct and right cerebral white matter small vessel disease 12/10  . Leg DVT (deep venous thromboembolism), acute ([place]) 2006  . Schizophrenia ([place])   . Stroke [facility])    left sided weakness  . Type 2 diabetes mellitus ([place])     Past Surgical History:  Procedure Laterality Date  . BACK SURGERY    . BIOPSY N/A 11/24/2014   Procedure: BIOPSY;  Surgeon: Danie Binder, MD;  Location: [facility];  Service: Endoscopy;  Laterality: N/A;  . BIOPSY  05/17/2016   Procedure: BIOPSY;  Surgeon: Danie Binder, MD;  Location: [facility];  Service: Endoscopy;;  gastric biopsy  . COLONOSCOPY WITH PROPOFOL N/A 11/24/2014   Dr. Rudie Meyer polyps removed/moderate sized internal hemorrhoids, tubular adenomas. Next surveillance in 3 years  . ESOPHAGOGASTRODUODENOSCOPY (EGD) WITH PROPOFOL N/A 11/24/2014   Dr. Clayburn Pert HH/patent stricture at the gastroesophageal junction, mild non-erosive gastritis, path negative for H.pylori or celiac  sprue  . ESOPHAGOGASTRODUODENOSCOPY (EGD) WITH PROPOFOL N/A 05/17/2016   Procedure: ESOPHAGOGASTRODUODENOSCOPY (EGD) WITH PROPOFOL;  Surgeon: Danie Binder, MD;  Location: [facility];  Service: Endoscopy;  Laterality: N/A;  12:45pm  . GIVENS CAPSULE STUDY N/A 12/11/2014   MULTILPLE EROSION IN the stomach WITH ACTIVE OOZING. OCCASIONAL EROSIONS AND RARE ULCER SEEN IN  PROXIMAL SMALL BOWEL . No masses or AVMs SEEN. NO OLD BLOOD OR FRESH BLOOD SEEN.   . POLYPECTOMY N/A 11/24/2014   Procedure: POLYPECTOMY;  Surgeon: Danie Binder, MD;  Location: [facility];  Service: Endoscopy;  Laterality: N/A;  . SAVORY DILATION N/A 05/17/2016   Procedure: SAVORY DILATION;  Surgeon: Danie Binder, MD;  Location: [facility];  Service: Endoscopy;  Laterality: N/A;    Current Outpatient Medications  Medication Sig Dispense Refill  . ABILIFY 2 MG tablet Take 1 tablet (2 mg total) by mouth daily. 28 tablet 1  . acetaminophen (TYLENOL) 325 MG tablet Take 650 mg by mouth 3 (three) times daily as needed.    . AMITIZA 8 MCG capsule Take 8 mcg by mouth daily with breakfast.     . calcium carbonate (TUMS - DOSED IN MG ELEMENTAL CALCIUM) 500 MG chewable tablet Chew 1 tablet by mouth 4 (four) times daily as needed for indigestion or heartburn.     . cholecalciferol (VITAMIN D) 1000 units tablet Take 2,000 Units by mouth daily.    . clonazePAM (KLONOPIN) 0.5 MG tablet Take 1 tablet (0.5 mg total) by mouth 3 (three) times daily. 90 tablet 2  . docusate sodium (COLACE) 100 MG capsule Take 200 mg by mouth at bedtime.     . DULoxetine (CYMBALTA) 60 MG capsule Take 120 mg by mouth daily.     . fluticasone (FLONASE) 50 MCG/ACT nasal spray Place into both nostrils as needed for allergies or rhinitis.    Marland Kitchen gabapentin (NEURONTIN) 300 MG capsule Take 300 mg by mouth 3 (three) times daily.    Marland Kitchen HYDROcodone-acetaminophen (NORCO) 10-325 MG tablet Take 1 tablet by mouth every 8 (eight) hours as needed.     . hydrocortisone (ANUSOL-HC) 2.5 % rectal cream Place 1 application rectally 2 (two) times daily as needed.     . Insulin Detemir (LEVEMIR FLEXTOUCH) 100 UNIT/ML Pen Inject 25 Units into the skin at bedtime. 15 mL 2  . iron polysaccharides (NIFEREX) 150 MG capsule Take 1 capsule (150 mg total) by mouth daily. 30 capsule 11  . latanoprost (XALATAN) 0.005 % ophthalmic solution Place 1 drop into both  eyes at bedtime.    Marland Kitchen loperamide (IMODIUM) 2 MG capsule Take 2 mg by mouth. Take 1 capsule by mouth after each loose stool    . loratadine (CLARITIN) 10 MG tablet Take 10 mg by mouth daily.    . metoprolol succinate (TOPROL-XL) 25 MG 24 hr tablet Take 0.5 tablets (12.5 mg total) by mouth daily. (Patient taking differently: Take 12.5 mg by mouth 2 (two) times daily. )    . midodrine (PROAMATINE) 2.5 MG tablet Take 2.5 mg by mouth 3 (three) times daily with meals.     . mirabegron ER (MYRBETRIQ) 25 MG TB24 tablet Take 25 mg by mouth daily.    . mirtazapine (REMERON) 30 MG tablet Take 30 mg by mouth at bedtime.      Marland Kitchen omeprazole (PRILOSEC) 40 MG capsule Take 40 mg by mouth daily.    . ondansetron (ZOFRAN) 4 MG tablet Take 1 tablet (4 mg total) by mouth 4 (four)  times daily -  before meals and at bedtime. (Patient taking differently: Take 4 mg by mouth as needed. ) 120 tablet 3  . polyethylene glycol powder (GLYCOLAX/MIRALAX) powder Take 17 g by mouth daily. 255 g 0  . polyvinyl alcohol (ARTIFICIAL TEARS) 1.4 % ophthalmic solution Place 1 drop into both eyes 3 (three) times daily.    . risperiDONE (RISPERDAL) 1 MG tablet Take 1 tablet (1 mg total) by mouth at bedtime. 28 tablet 2  . simvastatin (ZOCOR) 20 MG tablet Take 20 mg by mouth at bedtime.     Marland Kitchen tiZANidine (ZANAFLEX) 2 MG tablet Take 2 mg by mouth daily.    Marland Kitchen warfarin (COUMADIN) 5 MG tablet TAKE 1 TABLET (5mg ) BY MOUTH ONCE DAILY ON TUES,THURS,SAT. TAKE 1/2 TABLET (2.5mg ) ON SUN,MON, WED & FRI. 26 tablet 6  . zolpidem (AMBIEN) 10 MG tablet Take 1 tablet (10 mg total) by mouth at bedtime. Please hold this medication if she has drowsiness 30 tablet 2   No current facility-administered medications for this visit.     Allergies as of 09/09/2018 - Review Complete 09/09/2018  Allergen Reaction Noted  . Sulfa antibiotics Rash 02/23/2010  . Sulfonamide derivatives Rash     Family History  Problem Relation Age of Onset  . Hypertension Mother    . Colon cancer Neg Hx     Social History   Socioeconomic History  . Marital status: Widowed    Spouse name: Not on file  . Number of children: Not on file  . Years of education: Not on file  . Highest education level: Not on file  Occupational History  . Not on file  Social Needs  . Financial resource strain: Not on file  . Food insecurity    Worry: Not on file    Inability: Not on file  . Transportation needs    Medical: Not on file    Non-medical: Not on file  Tobacco Use  . Smoking status: Former Smoker    Packs/day: 0.25    Years: 20.00    Pack years: 5.00    Types: Cigarettes    Quit date: 01/24/1995    Years since quitting: 23.6  . Smokeless tobacco: Never Used  Substance and Sexual Activity  . Alcohol use: No    Alcohol/week: 0.0 standard drinks  . Drug use: No  . Sexual activity: Never    Birth control/protection: Post-menopausal  Lifestyle  . Physical activity    Days per week: Not on file    Minutes per session: Not on file  . Stress: Not on file  Relationships  . Social Herbalist on phone: Not on file    Gets together: Not on file    Attends religious service: Not on file    Active member of club or organization: Not on file    Attends meetings of clubs or organizations: Not on file    Relationship status: Not on file  Other Topics Concern  . Not on file  Social History Narrative  . Not on file    Review of Systems: Complete ROS negative except as per HPI.   Physical Exam: BP 114/61   Pulse 64   Temp (!) 97.5 F (36.4 C) (Oral)   Ht 5\' 5"  (1.651 m)   BMI 23.11 kg/m  General:   Alert and oriented. Pleasant and cooperative. Well-nourished and well-developed.  Eyes:  Without icterus, sclera clear and conjunctiva pink.  Ears:  Normal auditory acuity. Cardiovascular:  S1, S2 present without murmurs appreciated. Extremities without clubbing or edema. Respiratory:  Clear to auscultation bilaterally. No wheezes, rales, or rhonchi. No  distress.  Gastrointestinal:  +BS, soft, non-tender and non-distended. No HSM noted. No guarding or rebound. No masses appreciated.  Rectal:  Deferred  Musculoskalatal:  Symmetrical without gross deformities. Neurologic:  Alert and oriented x4;  grossly normal neurologically. Psych:  Alert and cooperative. Normal mood and affect. Heme/Lymph/Immune: No excessive bruising noted.    09/09/2018 11:09 AM   Disclaimer: This note was dictated with voice recognition software. Similar sounding words can inadvertently be transcribed and may not be corrected upon review.

## 2018-09-09 NOTE — Assessment & Plan Note (Signed)
Having worsening constipation as of late.  Having a bowel movement 1-2 times a week.  Patient is currently on Amitiza 8 mcg daily, stool softener daily, MiraLAX as needed.  At this point I will increase her Amitiza to 24 mcg twice daily, continue stool softener and increase MiraLAX to twice daily as needed.  Return for follow-up in 3 months.

## 2018-09-09 NOTE — Patient Instructions (Signed)
Your health issues we discussed today were:   Worsening constipation: 1. I will increase Amitiza to 24 mcg twice daily 2. Continue to take a stool softener daily as needed 3. You can take MiraLAX up to twice daily, as needed 4. Call us for any worsening or severe symptoms  Overall I recommend:  1. Continue your other current medications 2. Follow-up in 2 months 3. Call us if you have any questions or concerns.   Because of recent events of COVID-19 ("Coronavirus"), follow CDC recommendations:  1. Wash your hand frequently 2. Avoid touching your face 3. Stay away from people who are sick 4. If you have symptoms such as fever, cough, shortness of breath then call your healthcare provider for further guidance 5. If you are sick, STAY AT HOME unless otherwise directed by your healthcare provider. 6. Follow directions from state and national officials regarding staying safe   At [facility] we value your feedback. You may receive a survey about your visit today. Please share your experience as we strive to create trusting relationships with our patients to provide genuine, compassionate, quality care.  We appreciate your understanding and patience as we review any laboratory studies, imaging, and other diagnostic tests that are ordered as we care for you. Our office policy is 5 business days for review of these results, and any emergent or urgent results are addressed in a timely manner for your best interest. If you do not hear from our office in 1 week, please contact us.   We also encourage the use of MyChart, which contains your medical information for your review as well. If you are not enrolled in this feature, an access code is on this after visit summary for your convenience. Thank you for allowing [place] to be involved in your care.  It was great to see you today!  I hope you have a great summer!!

## 2018-09-11 NOTE — Progress Notes (Signed)
CC'ED TO PCP 

## 2018-09-11 NOTE — Progress Notes (Signed)
Virtual Visit via Telephone Note  I connected with [name] on 09/17/18 at  9:00 AM EDT by telephone and verified that I am speaking with the correct person using two identifiers.   I discussed the limitations, risks, security and privacy concerns of performing an evaluation and management service by telephone and the availability of in person appointments. I also discussed with the patient that there may be a patient responsible charge related to this service. The patient expressed understanding and agreed to proceed.     I discussed the assessment and treatment plan with the patient. The patient was provided an opportunity to ask questions and all were answered. The patient agreed with the plan and demonstrated an understanding of the instructions.   The patient was advised to call back or seek an in-person evaluation if the symptoms worsen or if the condition fails to improve as anticipated.  I provided 15 minutes of non-face-to-face time during this encounter.   [name], MD     [facility] MD/PA/NP OP Progress Note  09/17/2018 9:28 AM [name]  MRN:  [id number]  Chief Complaint:  Chief Complaint    Follow-up; Other     HPI:  This is a follow-up appointment for schizophrenia by history and depression.  She states that she has been feeling nervous, which she attributes to some pain in her leg. She enjoys watching TV/game shows. Although bingo is not available to resident anymore (according to the staff,) she enjoys coloring. She sleeps 8pm-4 am. She denies feeling depressed.  She has better appetite.  She has fair concentration.  She denies SI.  She denies panic attacks.  She feels safe at the facility, stating that it is "wonderful.' She denies AH, VH.   The staff at [facility] presents to the interview.  She has been doing better than before. No behavioral issues or safety concerns. She takes medication regularly. Of note, she does not take mirtazapine according to [facility]  (Staff will fax this)  Visit Diagnosis:    ICD-10-CM   1. Schizophrenia, unspecified type ([place])  F20.9 ABILIFY 2 MG tablet    zolpidem (AMBIEN) 10 MG tablet    Past Psychiatric History: Please see initial evaluation for full details. I have reviewed the history. No updates at this time.     Past Medical History:  Past Medical History:  Diagnosis Date  . Anxiety   . Arthritis   . Dependence on wheelchair    pivot/transfers  . Depression    History of psychosis and previous suicide attempt  . DVT, lower extremity, recurrent (HCC)    Long-term Coumadin per Dr. Legrand Rams  . Essential hypertension   . GERD (gastroesophageal reflux disease)   . Hemiplegia ([place]) 2010   Left side  . History of stroke    Acute infarct and right cerebral white matter small vessel disease 12/10  . Leg DVT (deep venous thromboembolism), acute ([place]) 2006  . Schizophrenia ([place])   . Stroke [facility])    left sided weakness  . Type 2 diabetes mellitus ([place])     Past Surgical History:  Procedure Laterality Date  . BACK SURGERY    . BIOPSY N/A 11/24/2014   Procedure: BIOPSY;  Surgeon: Danie Binder, MD;  Location: [facility] ORS;  Service: Endoscopy;  Laterality: N/A;  . BIOPSY  05/17/2016   Procedure: BIOPSY;  Surgeon: Danie Binder, MD;  Location: [facility] ENDO SUITE;  Service: Endoscopy;;  gastric biopsy  . COLONOSCOPY WITH PROPOFOL N/A 11/24/2014  Dr. Rudie Meyer polyps removed/moderate sized internal hemorrhoids, tubular adenomas. Next surveillance in 3 years  . ESOPHAGOGASTRODUODENOSCOPY (EGD) WITH PROPOFOL N/A 11/24/2014   Dr. Clayburn Pert HH/patent stricture at the gastroesophageal junction, mild non-erosive gastritis, path negative for H.pylori or celiac sprue  . ESOPHAGOGASTRODUODENOSCOPY (EGD) WITH PROPOFOL N/A 05/17/2016   Procedure: ESOPHAGOGASTRODUODENOSCOPY (EGD) WITH PROPOFOL;  Surgeon: Danie Binder, MD;  Location: [facility] ENDO SU[facility];  Service: Endoscopy;  Laterality: N/A;  12:45pm  . GIVENS CAPSULE STUDY N/A  12/11/2014   MULTILPLE EROSION IN the stomach WITH ACTIVE OOZING. OCCASIONAL EROSIONS AND RARE ULCER SEEN IN PROXIMAL SMALL BOWEL . No masses or AVMs SEEN. NO OLD BLOOD OR FRESH BLOOD SEEN.   . POLYPECTOMY N/A 11/24/2014   Procedure: POLYPECTOMY;  Surgeon: Danie Binder, MD;  Location: [facility] ORS;  Service: Endoscopy;  Laterality: N/A;  . SAVORY DILATION N/A 05/17/2016   Procedure: SAVORY DILATION;  Surgeon: Danie Binder, MD;  Location: [facility] ENDO SUITE;  Service: Endoscopy;  Laterality: N/A;    Family Psychiatric History: Please see initial evaluation for full details. I have reviewed the history. No updates at this time.     Family History:  Family History  Problem Relation Age of Onset  . Hypertension Mother   . Colon cancer Neg Hx     Social History:  Social History   Socioeconomic History  . Marital status: Widowed    Spouse name: Not on file  . Number of children: Not on file  . Years of education: Not on file  . Highest education level: Not on file  Occupational History  . Not on file  Social Needs  . Financial resource strain: Not on file  . Food insecurity    Worry: Not on file    Inability: Not on file  . Transportation needs    Medical: Not on file    Non-medical: Not on file  Tobacco Use  . Smoking status: Former Smoker    Packs/day: 0.25    Years: 20.00    Pack years: 5.00    Types: Cigarettes    Quit date: 01/24/1995    Years since quitting: 23.6  . Smokeless tobacco: Never Used  Substance and Sexual Activity  . Alcohol use: No    Alcohol/week: 0.0 standard drinks  . Drug use: No  . Sexual activity: Never    Birth control/protection: Post-menopausal  Lifestyle  . Physical activity    Days per week: Not on file    Minutes per session: Not on file  . Stress: Not on file  Relationships  . Social Herbalist on phone: Not on file    Gets together: Not on file    Attends religious service: Not on file    Active member of club or organization:  Not on file    Attends meetings of clubs or organizations: Not on file    Relationship status: Not on file  Other Topics Concern  . Not on file  Social History Narrative  . Not on file    Allergies:  Allergies  Allergen Reactions  . Sulfa Antibiotics Rash  . Sulfonamide Derivatives Rash    REACTION: rash    Metabolic Disorder Labs: Lab Results  Component Value Date   HGBA1C 7.2 (H) 06/27/2018   MPG 160 06/27/2018   MPG 134 03/15/2018   No results found for: PROLACTIN Lab Results  Component Value Date   CHOL 133 10/07/2016   TRIG 252 (H) 10/07/2016   HDL 28 (  L) 10/07/2016   CHOLHDL 4.8 10/07/2016   VLDL 50 (H) 10/07/2016   LDLCALC 55 10/07/2016   LDLCALC (H) 01/03/2009    143        Total Cholesterol/HDL:CHD Risk Coronary Heart Disease Risk Table                     Men   Women  1/2 Average Risk   3.4   3.3  Average Risk       5.0   4.4  2 X Average Risk   9.6   7.1  3 X Average Risk  23.4   11.0        Use the calculated Patient Ratio above and the CHD Risk Table to determine the patient's CHD Risk.        ATP III CLASSIFICATION (LDL):  <100     mg/dL   Optimal  100-129  mg/dL   Near or Above                    Optimal  130-159  mg/dL   Borderline  160-189  mg/dL   High  >190     mg/dL   Very High   Lab Results  Component Value Date   TSH 1.138 03/29/2017   TSH 0.538 02/13/2016    Therapeutic Level Labs: No results found for: LITHIUM No results found for: VALPROATE No components found for:  CBMZ  Current Medications: Current Outpatient Medications  Medication Sig Dispense Refill  . ABILIFY 2 MG tablet Take 1 tablet (2 mg total) by mouth daily. 28 tablet 2  . acetaminophen (TYLENOL) 325 MG tablet Take 650 mg by mouth 3 (three) times daily as needed.    . calcium carbonate (TUMS - DOSED IN MG ELEMENTAL CALCIUM) 500 MG chewable tablet Chew 1 tablet by mouth 4 (four) times daily as needed for indigestion or heartburn.     . cholecalciferol (VITAMIN  D) 1000 units tablet Take 2,000 Units by mouth daily.    . clonazePAM (KLONOPIN) 0.5 MG tablet Take 1 tablet (0.5 mg total) by mouth 3 (three) times daily. 90 tablet 2  . docusate sodium (COLACE) 100 MG capsule Take 200 mg by mouth at bedtime.     . DULoxetine (CYMBALTA) 60 MG capsule Take 120 mg by mouth daily.     . fluticasone (FLONASE) 50 MCG/ACT nasal spray Place into both nostrils as needed for allergies or rhinitis.    Marland Kitchen gabapentin (NEURONTIN) 300 MG capsule Take 300 mg by mouth 3 (three) times daily.    Marland Kitchen HYDROcodone-acetaminophen (NORCO) 10-325 MG tablet Take 1 tablet by mouth every 8 (eight) hours as needed.     . hydrocortisone (ANUSOL-HC) 2.5 % rectal cream Place 1 application rectally 2 (two) times daily as needed.     . Insulin Detemir (LEVEMIR FLEXTOUCH) 100 UNIT/ML Pen Inject 25 Units into the skin at bedtime. 15 mL 2  . iron polysaccharides (NIFEREX) 150 MG capsule Take 1 capsule (150 mg total) by mouth daily. 30 capsule 11  . latanoprost (XALATAN) 0.005 % ophthalmic solution Place 1 drop into both eyes at bedtime.    Marland Kitchen loperamide (IMODIUM) 2 MG capsule Take 2 mg by mouth. Take 1 capsule by mouth after each loose stool    . loratadine (CLARITIN) 10 MG tablet Take 10 mg by mouth daily.    Marland Kitchen lubiprostone (AMITIZA) 24 MCG capsule Take 1 capsule (24 mcg total) by mouth 2 (two) times daily with  a meal. 180 capsule 3  . metoprolol succinate (TOPROL-XL) 25 MG 24 hr tablet Take 0.5 tablets (12.5 mg total) by mouth daily. (Patient taking differently: Take 12.5 mg by mouth 2 (two) times daily. )    . midodrine (PROAMATINE) 2.5 MG tablet Take 2.5 mg by mouth 3 (three) times daily with meals.     . mirabegron ER (MYRBETRIQ) 25 MG TB24 tablet Take 25 mg by mouth daily.    . mirtazapine (REMERON) 30 MG tablet Take 30 mg by mouth at bedtime.      Marland Kitchen omeprazole (PRILOSEC) 40 MG capsule Take 40 mg by mouth daily.    . ondansetron (ZOFRAN) 4 MG tablet Take 1 tablet (4 mg total) by mouth 4 (four)  times daily -  before meals and at bedtime. (Patient taking differently: Take 4 mg by mouth as needed. ) 120 tablet 3  . polyethylene glycol powder (GLYCOLAX/MIRALAX) powder Take 17 g by mouth daily. 255 g 0  . polyvinyl alcohol (ARTIFICIAL TEARS) 1.4 % ophthalmic solution Place 1 drop into both eyes 3 (three) times daily.    . risperiDONE (RISPERDAL) 1 MG tablet Take 1 tablet (1 mg total) by mouth at bedtime. 28 tablet 2  . simvastatin (ZOCOR) 20 MG tablet Take 20 mg by mouth at bedtime.     Marland Kitchen tiZANidine (ZANAFLEX) 2 MG tablet Take 2 mg by mouth daily.    Marland Kitchen warfarin (COUMADIN) 5 MG tablet TAKE 1 TABLET (5mg ) BY MOUTH ONCE DAILY ON TUES,THURS,SAT. TAKE 1/2 TABLET (2.5mg ) ON SUN,MON, WED & FRI. 26 tablet 6  . [START ON 10/07/2018] zolpidem (AMBIEN) 10 MG tablet Take 1 tablet (10 mg total) by mouth at bedtime. Please hold this medication if she has drowsiness 30 tablet 1   No current facility-administered medications for this visit.    On mirabegron 25 mg daily  Musculoskeletal: Strength & Muscle Tone: N/A Gait & Station: N/A Patient leans: N/A  Psychiatric Specialty Exam: Review of Systems  Psychiatric/Behavioral: Negative for depression, hallucinations, memory loss, substance abuse and suicidal ideas. The patient is nervous/anxious. The patient does not have insomnia.   All other systems reviewed and are negative.   There were no vitals taken for this visit.There is no height or weight on file to calculate BMI.  General Appearance: NA  Eye Contact:  NA  Speech:  Clear and Coherent  Volume:  Normal  Mood:  "nervous"  Affect:  NA  Thought Process:  Coherent  Orientation:  Full (Time, Place, and Person)  Thought Content: Logical   Suicidal Thoughts:  No  Homicidal Thoughts:  No  Memory:  Immediate;   Good  Judgement:  Fair  Insight:  Present  Psychomotor Activity:  Normal  Concentration:  Concentration: Good and Attention Span: Good  Recall:  [name] of Knowledge: Good   Language: Good  Akathisia:  No  Handed:  Right  AIMS (if indicated): not done  Assets:  Desire for Improvement Social Support  ADL's:  Intact  Cognition: WNL  Sleep:  Good   Screenings: PHQ2-9     Office Visit from 11/28/2017 in [facility] Office Visit from 09/25/2017 in [facility] Office Visit from 03/28/2017 in [facility] Office Visit from 09/13/2016 in [facility] Office Visit from 06/07/2016 in [facility]  PHQ-2 Total Score  0  0  0  0  0       Assessment and Plan:  ILLYRIA SOBOCINSKI is a 73 y.o.  year old female with a history of schizophrenia, depression,  SVT, DVT, history of stroke on warfarin, type II diabetes, hypertension, GERD, who presents for follow up appointment for Schizophrenia, unspecified type ([place]) - Plan: ABILIFY 2 MG tablet, zolpidem (AMBIEN) 10 MG tablet  # Schizophrenia by history # MDD She demonstrates linear thought process, which has been consistent with initial evaluation.  There has been no known psychotic episode or behavioral issues.  Will continue duloxetine to target depression and anxiety.  Noted that the patient has not been on mirtazapine according to staff; will obtain MAR to verify this.  We will plan to hold this medication if she has not taken this medication.  Will continue Abilify as adjunctive treatment for depression.  Will continue Risperdal to target mood dysregulation.  Noted that although it is preferable to avoid using two antipsychotics, she had worsening in depressive symptoms in the context of tapering off Risperidone.  Will continue clonazepam as needed for anxiety, prescribed by her neurologist.  Will continue Ambien as needed for insomnia; will plan to taper it off in the future.   # Cognitive deficits Although she does have cognitive deficits, which is catheterized in Mini-cog, she is oriented on evaluation. Will  continue to assess as needed.   Plan I have reviewed and updated plans as below 1. Continue duloxetine 120 mg daily (Dr. Legrand Rams.)   2. ContinueAbilify2 mg daily 3. Continue Risperidone 1 mg at night (worsening in decreased appetite/withdrawn when tapering off this medication) - hold mirtazapine 4.Continue Zolpidem 10 mg at night  5.Continueclonazepam 0.5 mg twicea day for anxiety (prescribed by Dr. Merlene Laughter) 6. Next appointment: 11/3 at 11:20 for 30 mins, phone 7. Obtain record from Dr. Peggye Form- pending -on gabapentin 300 mg TID Will fax AVS to [phone]  The patient demonstrates the following risk factors for suicide: Chronic risk factors for suicide include:psychiatric disorder ofschizophrenia by history, depression. Acute risk factorsfor suicide include: unemployment. Protective factorsfor this patient include: positive social support and hope for the future. Considering these factors, the overall suicide risk at this point appears to below. Patientisappropriate for outpatient follow up.   [name], MD 09/17/2018, 9:28 AM

## 2018-09-17 ENCOUNTER — Other Ambulatory Visit: Payer: Self-pay

## 2018-09-17 ENCOUNTER — Ambulatory Visit (INDEPENDENT_AMBULATORY_CARE_PROVIDER_SITE_OTHER): Payer: Medicare Other | Admitting: Psychiatry

## 2018-09-17 ENCOUNTER — Encounter (HOSPITAL_COMMUNITY): Payer: Self-pay | Admitting: Psychiatry

## 2018-09-17 ENCOUNTER — Telehealth: Payer: Self-pay | Admitting: Pharmacist

## 2018-09-17 DIAGNOSIS — R4189 Other symptoms and signs involving cognitive functions and awareness: Secondary | ICD-10-CM | POA: Diagnosis not present

## 2018-09-17 DIAGNOSIS — F209 Schizophrenia, unspecified: Secondary | ICD-10-CM | POA: Diagnosis not present

## 2018-09-17 DIAGNOSIS — F329 Major depressive disorder, single episode, unspecified: Secondary | ICD-10-CM | POA: Diagnosis not present

## 2018-09-17 MED ORDER — ZOLPIDEM TARTRATE 10 MG PO TABS: 10.0000 mg | ORAL_TABLET | Freq: Every day | ORAL | 1 refills | Status: DC

## 2018-09-17 MED ORDER — ABILIFY 2 MG PO TABS: 2.0000 mg | ORAL_TABLET | Freq: Every day | ORAL | 2 refills | Status: DC

## 2018-09-17 MED ORDER — RISPERIDONE 1 MG PO TABS: 1.0000 mg | ORAL_TABLET | Freq: Every day | ORAL | 2 refills | Status: DC

## 2018-09-17 NOTE — Patient Instructions (Signed)
1. Continue duloxetine 120 mg daily 2. ContinueAbilify2 mg daily 3.Continue Risperidone 1 mg at night 4.ContinueZolpidem 10 mg at night  5. Next appointment: 11/3 at 11:20  (No need to restart mirtazapine if she has been off this medication)

## 2018-09-17 NOTE — Telephone Encounter (Signed)
Pt takes warfarin for history of recurrent DVT and stroke. Recommend bridging with Lovenox while off of Coumadin for procedure.

## 2018-09-17 NOTE — Telephone Encounter (Signed)
-----   Message from Malen Gauze, RN sent at 09/17/2018  9:44 AM EDT ----- [facility] wants to hold coumadin x 5 days for Epidural Steroid injection on 10/24/18 and 11/25/18. Please confirm need for Lovenox with H/O CVA. Thanks, Lattie Haw

## 2018-09-18 ENCOUNTER — Ambulatory Visit (INDEPENDENT_AMBULATORY_CARE_PROVIDER_SITE_OTHER): Payer: Medicare Other | Admitting: *Deleted

## 2018-09-18 ENCOUNTER — Other Ambulatory Visit: Payer: Self-pay

## 2018-09-18 DIAGNOSIS — Z5181 Encounter for therapeutic drug level monitoring: Secondary | ICD-10-CM | POA: Diagnosis not present

## 2018-09-18 DIAGNOSIS — I6930 Unspecified sequelae of cerebral infarction: Secondary | ICD-10-CM | POA: Diagnosis not present

## 2018-09-18 DIAGNOSIS — I82402 Acute embolism and thrombosis of unspecified deep veins of left lower extremity: Secondary | ICD-10-CM | POA: Diagnosis not present

## 2018-09-18 DIAGNOSIS — Z8679 Personal history of other diseases of the circulatory system: Secondary | ICD-10-CM | POA: Diagnosis not present

## 2018-09-18 LAB — POCT INR: INR: 1.9 — AB (ref 2.0–3.0)

## 2018-09-18 NOTE — Patient Instructions (Signed)
Take coumadin 1 tablet tonight then resume 1/2 tablets daily except 1 tablet on Tuesdays, Thursdays and Saturdays Recheck in 6 weeks Pt states she is not going to have injections in back on 10/1 and 11/2

## 2018-09-19 DIAGNOSIS — Z794 Long term (current) use of insulin: Secondary | ICD-10-CM | POA: Diagnosis not present

## 2018-09-19 DIAGNOSIS — M48061 Spinal stenosis, lumbar region without neurogenic claudication: Secondary | ICD-10-CM | POA: Diagnosis not present

## 2018-09-19 DIAGNOSIS — F209 Schizophrenia, unspecified: Secondary | ICD-10-CM | POA: Diagnosis not present

## 2018-09-19 DIAGNOSIS — R32 Unspecified urinary incontinence: Secondary | ICD-10-CM | POA: Diagnosis not present

## 2018-09-19 DIAGNOSIS — I69354 Hemiplegia and hemiparesis following cerebral infarction affecting left non-dominant side: Secondary | ICD-10-CM | POA: Diagnosis not present

## 2018-09-19 DIAGNOSIS — E1151 Type 2 diabetes mellitus with diabetic peripheral angiopathy without gangrene: Secondary | ICD-10-CM | POA: Diagnosis not present

## 2018-09-19 DIAGNOSIS — M21372 Foot drop, left foot: Secondary | ICD-10-CM | POA: Diagnosis not present

## 2018-09-19 DIAGNOSIS — M4316 Spondylolisthesis, lumbar region: Secondary | ICD-10-CM | POA: Diagnosis not present

## 2018-09-23 DIAGNOSIS — F209 Schizophrenia, unspecified: Secondary | ICD-10-CM | POA: Diagnosis not present

## 2018-09-23 DIAGNOSIS — M4316 Spondylolisthesis, lumbar region: Secondary | ICD-10-CM | POA: Diagnosis not present

## 2018-09-23 DIAGNOSIS — M21372 Foot drop, left foot: Secondary | ICD-10-CM | POA: Diagnosis not present

## 2018-09-23 DIAGNOSIS — I69354 Hemiplegia and hemiparesis following cerebral infarction affecting left non-dominant side: Secondary | ICD-10-CM | POA: Diagnosis not present

## 2018-09-23 DIAGNOSIS — M48061 Spinal stenosis, lumbar region without neurogenic claudication: Secondary | ICD-10-CM | POA: Diagnosis not present

## 2018-09-23 DIAGNOSIS — E1151 Type 2 diabetes mellitus with diabetic peripheral angiopathy without gangrene: Secondary | ICD-10-CM | POA: Diagnosis not present

## 2018-09-25 DIAGNOSIS — F209 Schizophrenia, unspecified: Secondary | ICD-10-CM | POA: Diagnosis not present

## 2018-09-25 DIAGNOSIS — M4316 Spondylolisthesis, lumbar region: Secondary | ICD-10-CM | POA: Diagnosis not present

## 2018-09-25 DIAGNOSIS — I69354 Hemiplegia and hemiparesis following cerebral infarction affecting left non-dominant side: Secondary | ICD-10-CM | POA: Diagnosis not present

## 2018-09-25 DIAGNOSIS — M21372 Foot drop, left foot: Secondary | ICD-10-CM | POA: Diagnosis not present

## 2018-09-25 DIAGNOSIS — M48061 Spinal stenosis, lumbar region without neurogenic claudication: Secondary | ICD-10-CM | POA: Diagnosis not present

## 2018-09-25 DIAGNOSIS — E1151 Type 2 diabetes mellitus with diabetic peripheral angiopathy without gangrene: Secondary | ICD-10-CM | POA: Diagnosis not present

## 2018-09-26 ENCOUNTER — Telehealth: Payer: Self-pay

## 2018-09-26 ENCOUNTER — Telehealth (INDEPENDENT_AMBULATORY_CARE_PROVIDER_SITE_OTHER): Payer: Medicare Other | Admitting: Cardiology

## 2018-09-26 ENCOUNTER — Encounter: Payer: Self-pay | Admitting: Cardiology

## 2018-09-26 DIAGNOSIS — F209 Schizophrenia, unspecified: Secondary | ICD-10-CM | POA: Diagnosis not present

## 2018-09-26 DIAGNOSIS — E1151 Type 2 diabetes mellitus with diabetic peripheral angiopathy without gangrene: Secondary | ICD-10-CM | POA: Diagnosis not present

## 2018-09-26 DIAGNOSIS — I951 Orthostatic hypotension: Secondary | ICD-10-CM | POA: Diagnosis not present

## 2018-09-26 DIAGNOSIS — I69354 Hemiplegia and hemiparesis following cerebral infarction affecting left non-dominant side: Secondary | ICD-10-CM | POA: Diagnosis not present

## 2018-09-26 DIAGNOSIS — M21372 Foot drop, left foot: Secondary | ICD-10-CM | POA: Diagnosis not present

## 2018-09-26 DIAGNOSIS — Z86718 Personal history of other venous thrombosis and embolism: Secondary | ICD-10-CM

## 2018-09-26 DIAGNOSIS — M48061 Spinal stenosis, lumbar region without neurogenic claudication: Secondary | ICD-10-CM | POA: Diagnosis not present

## 2018-09-26 DIAGNOSIS — K582 Mixed irritable bowel syndrome: Secondary | ICD-10-CM

## 2018-09-26 DIAGNOSIS — M4316 Spondylolisthesis, lumbar region: Secondary | ICD-10-CM | POA: Diagnosis not present

## 2018-09-26 DIAGNOSIS — Z8673 Personal history of transient ischemic attack (TIA), and cerebral infarction without residual deficits: Secondary | ICD-10-CM | POA: Diagnosis not present

## 2018-09-26 NOTE — Telephone Encounter (Signed)
I'm not sure how exactly to give them a d/c order? I can print a new Rx for them to try Amitiza once daily when I know how to send a "d/c order." They just need to be aware her constipation may worsen on once a day therapy.

## 2018-09-26 NOTE — Telephone Encounter (Signed)
EG, you can print the new RX out. I can write the D/c order on a RX pad and fax it over with the RX if you'd like.

## 2018-09-26 NOTE — Telephone Encounter (Signed)
Received a call from [place] at [place]. Pt is taking Amitiza 24 mcg bid and it's hurting her stomach. Pt would like to take the Amitiza once daily in the evening if possible. Pt will need a D/c order and new rx faxed to the pharmacy at [phone] and to the home [phone] if orders change.

## 2018-09-26 NOTE — Patient Instructions (Signed)
Medication Instructions: Your physician recommends that you continue on your current medications as directed. Please refer to the Current Medication list given to you today.   Labwork: None  Procedures/Testing: None  Follow-Up: 1 year with Dr.McDowell  Any Additional Special Instructions Will Be Listed Below (If Applicable).     If you need a refill on your cardiac medications before your next appointment, please call your pharmacy.     Thank you for choosing [facility] !

## 2018-09-26 NOTE — Progress Notes (Signed)
Virtual Visit via Telephone Note   This visit type was conducted due to national recommendations for restrictions regarding the COVID-19 Pandemic (e.g. social distancing) in an effort to limit this patient's exposure and mitigate transmission in our community.  Due to her co-morbid illnesses, this patient is at least at moderate risk for complications without adequate follow up.  This format is felt to be most appropriate for this patient at this time.  The patient did not have access to video technology/had technical difficulties with video requiring transitioning to audio format only (telephone).  All issues noted in this document were discussed and addressed.  No physical exam could be performed with this format.  Please refer to the patient's chart for her  consent to telehealth for [facility].   Date:  09/26/2018   ID:  [name], DOB 1945/09/05, MRN [id number]  Patient Location: [address] Provider Location: Office  PCP:  [name], MD  Cardiologist:  Rozann Lesches, MD Electrophysiologist:  None   Evaluation Performed:  Follow-Up Visit  Chief Complaint:   Cardiac follow-up  History of Present Illness:    [name] is a 73 y.o. female last seen in June 2019. She is a resident of [facility].  We follow her in our Coumadin clinic, she has prior history of DVT and stroke with chronic anticoagulation recommended by her PCP Dr. Legrand Rams.  I spoke with the patient and also Stacy at her facility.  No reported recent major change in health.  She was being considered for spine injections for pain control, however PT is being pursued first.  Coumadin has not been interrupted.  She does not report any obvious bleeding problems.  Remains functionally limited, can walk only a short distance with walker and assistance, uses a wheelchair otherwise.  No recent falls reported.  I reviewed her medications which are outlined below.  The patient does not have symptoms  concerning for COVID-19 infection (fever, chills, cough, or new shortness of breath).    Past Medical History:  Diagnosis Date  . Anxiety   . Arthritis   . Dependence on wheelchair    pivot/transfers  . Depression    History of psychosis and previous suicide attempt  . DVT, lower extremity, recurrent (HCC)    Long-term Coumadin per Dr. Legrand Rams  . Essential hypertension   . GERD (gastroesophageal reflux disease)   . Hemiplegia ([place]) 2010   Left side  . History of stroke    Acute infarct and right cerebral white matter small vessel disease 12/10  . Leg DVT (deep venous thromboembolism), acute ([place]) 2006  . Schizophrenia ([place])   . Stroke [facility])    left sided weakness  . Type 2 diabetes mellitus ([facility])    Past Surgical History:  Procedure Laterality Date  . BACK SURGERY    . BIOPSY N/A 11/24/2014   Procedure: BIOPSY;  Surgeon: Danie Binder, MD;  Location: [facility];  Service: Endoscopy;  Laterality: N/A;  . BIOPSY  05/17/2016   Procedure: BIOPSY;  Surgeon: Danie Binder, MD;  Location: [facility];  Service: Endoscopy;;  gastric biopsy  . COLONOSCOPY WITH PROPOFOL N/A 11/24/2014   Dr. Rudie Meyer polyps removed/moderate sized internal hemorrhoids, tubular adenomas. Next surveillance in 3 years  . ESOPHAGOGASTRODUODENOSCOPY (EGD) WITH PROPOFOL N/A 11/24/2014   Dr. Clayburn Pert HH/patent stricture at the gastroesophageal junction, mild non-erosive gastritis, path negative for H.pylori or celiac sprue  . ESOPHAGOGASTRODUODENOSCOPY (EGD) WITH PROPOFOL N/A 05/17/2016   Procedure: ESOPHAGOGASTRODUODENOSCOPY (  EGD) WITH PROPOFOL;  Surgeon: Danie Binder, MD;  Location: [facility];  Service: Endoscopy;  Laterality: N/A;  12:45pm  . GIVENS CAPSULE STUDY N/A 12/11/2014   MULTILPLE EROSION IN the stomach WITH ACTIVE OOZING. OCCASIONAL EROSIONS AND RARE ULCER SEEN IN PROXIMAL SMALL BOWEL . No masses or AVMs SEEN. NO OLD BLOOD OR FRESH BLOOD SEEN.   . POLYPECTOMY N/A 11/24/2014   Procedure:  POLYPECTOMY;  Surgeon: Danie Binder, MD;  Location: [facility];  Service: Endoscopy;  Laterality: N/A;  . SAVORY DILATION N/A 05/17/2016   Procedure: SAVORY DILATION;  Surgeon: Danie Binder, MD;  Location: [facility];  Service: Endoscopy;  Laterality: N/A;     Current Meds  Medication Sig  . ABILIFY 2 MG tablet Take 1 tablet (2 mg total) by mouth daily.  Marland Kitchen acetaminophen (TYLENOL) 325 MG tablet Take 650 mg by mouth 3 (three) times daily as needed.  . calcium carbonate (TUMS - DOSED IN MG ELEMENTAL CALCIUM) 500 MG chewable tablet Chew 1 tablet by mouth 4 (four) times daily as needed for indigestion or heartburn.   . cholecalciferol (VITAMIN D) 1000 units tablet Take 2,000 Units by mouth daily.  . clonazePAM (KLONOPIN) 0.5 MG tablet Take 1 tablet (0.5 mg total) by mouth 3 (three) times daily.  Marland Kitchen docusate sodium (COLACE) 100 MG capsule Take 200 mg by mouth at bedtime.   . DULoxetine (CYMBALTA) 60 MG capsule Take 120 mg by mouth daily.   . fluticasone (FLONASE) 50 MCG/ACT nasal spray Place into both nostrils as needed for allergies or rhinitis.  Marland Kitchen gabapentin (NEURONTIN) 300 MG capsule Take 300 mg by mouth 3 (three) times daily.  Marland Kitchen HYDROcodone-acetaminophen (NORCO) 10-325 MG tablet Take 1 tablet by mouth every 8 (eight) hours as needed.   . hydrocortisone (ANUSOL-HC) 2.5 % rectal cream Place 1 application rectally 2 (two) times daily as needed.   . Insulin Detemir (LEVEMIR FLEXTOUCH) 100 UNIT/ML Pen Inject 25 Units into the skin at bedtime.  . iron polysaccharides (NIFEREX) 150 MG capsule Take 1 capsule (150 mg total) by mouth daily.  Marland Kitchen latanoprost (XALATAN) 0.005 % ophthalmic solution Place 1 drop into both eyes at bedtime.  Marland Kitchen lubiprostone (AMITIZA) 24 MCG capsule Take 1 capsule (24 mcg total) by mouth 2 (two) times daily with a meal.  . metoprolol succinate (TOPROL-XL) 25 MG 24 hr tablet Take 0.5 tablets (12.5 mg total) by mouth daily. (Patient taking differently: Take 12.5 mg by mouth 2 (two)  times daily. )  . midodrine (PROAMATINE) 2.5 MG tablet Take 2.5 mg by mouth 3 (three) times daily with meals.   . mirabegron ER (MYRBETRIQ) 25 MG TB24 tablet Take 25 mg by mouth daily.  . mirtazapine (REMERON) 30 MG tablet Take 30 mg by mouth at bedtime.    Marland Kitchen omeprazole (PRILOSEC) 40 MG capsule Take 40 mg by mouth daily.  . ondansetron (ZOFRAN) 4 MG tablet Take 1 tablet (4 mg total) by mouth 4 (four) times daily -  before meals and at bedtime. (Patient taking differently: Take 4 mg by mouth as needed. )  . polyethylene glycol powder (GLYCOLAX/MIRALAX) powder Take 17 g by mouth daily.  . polyvinyl alcohol (ARTIFICIAL TEARS) 1.4 % ophthalmic solution Place 1 drop into both eyes 3 (three) times daily.  . risperiDONE (RISPERDAL) 1 MG tablet Take 1 tablet (1 mg total) by mouth at bedtime.  . simvastatin (ZOCOR) 20 MG tablet Take 20 mg by mouth at bedtime.   Marland Kitchen tiZANidine (ZANAFLEX) 2  MG tablet Take 2 mg by mouth daily.  Marland Kitchen warfarin (COUMADIN) 5 MG tablet TAKE 1 TABLET (5mg ) BY MOUTH ONCE DAILY ON TUES,THURS,SAT. TAKE 1/2 TABLET (2.5mg ) ON SUN,MON, WED & FRI.  Derrill Memo ON 10/07/2018] zolpidem (AMBIEN) 10 MG tablet Take 1 tablet (10 mg total) by mouth at bedtime. Please hold this medication if she has drowsiness     Allergies:   Sulfa antibiotics and Sulfonamide derivatives   Social History   Tobacco Use  . Smoking status: Former Smoker    Packs/day: 0.25    Years: 20.00    Pack years: 5.00    Types: Cigarettes    Quit date: 01/24/1995    Years since quitting: 23.6  . Smokeless tobacco: Never Used  Substance Use Topics  . Alcohol use: No    Alcohol/week: 0.0 standard drinks  . Drug use: No     Family Hx: The patient's family history includes Hypertension in her mother. There is no history of Colon cancer.  ROS:   Please see the history of present illness.    Hearing loss. All other systems reviewed and are negative.   Prior CV studies:   The following studies were reviewed today:   Echocardiogram 10/06/2016: Study Conclusions  - Left ventricle: The cavity size was normal. Systolic function was   vigorous. The estimated ejection fraction was in the range of 65%   to 70%. Wall motion was normal; there were no regional wall   motion abnormalities. Doppler parameters are consistent with   abnormal left ventricular relaxation (grade 1 diastolic   dysfunction). Doppler parameters are consistent with high   ventricular filling pressure. Moderate concentric and severe   focal basal septal hypertrophy. - Aortic valve: Moderately calcified annulus. Trileaflet. - Mitral valve: Calcified annulus.  Labs/Other Tests and Data Reviewed:    EKG:  An ECG dated 06/27/2017 was personally reviewed today and demonstrated:  Sinus rhythm with possible old inferolateral infarct pattern.  Recent Labs: 03/25/2018: Hemoglobin 12.3; Platelets 357 06/27/2018: ALT 4; BUN 9; Creat 0.60; Potassium 4.1; Sodium 139   Recent Lipid Panel Lab Results  Component Value Date/Time   CHOL 133 10/07/2016 06:20 AM   TRIG 252 (H) 10/07/2016 06:20 AM   HDL 28 (L) 10/07/2016 06:20 AM   CHOLHDL 4.8 10/07/2016 06:20 AM   LDLCALC 55 10/07/2016 06:20 AM    Wt Readings from Last 3 Encounters:  09/26/18 125 lb (56.7 kg)  07/12/18 138 lb 14.2 oz (63 kg)  07/11/18 138 lb 14.2 oz (63 kg)     Objective:    Vital Signs:  BP (!) 93/42   Pulse (!) 56   Temp (!) 96.8 F (36 C)   Resp 17   Wt 125 lb (56.7 kg)   BMI 20.80 kg/m    Patient spoke in short sentences, not obviously short of breath. No audible wheezing or coughing.  ASSESSMENT & PLAN:    1.  History of DVT and stroke.  She is on chronic Coumadin under the direction of Dr. Legrand Rams, followed in our anticoagulation clinic.  No reported bleeding episodes.  2.  History of SVT.  This has been quiescent over time.  She remains on low-dose Toprol-XL.  3.  Orthostatic hypotension on Midodrine.  COVID-19 Education: The signs and symptoms of COVID-19  were discussed with the patient and how to seek care for testing (follow up with PCP or arrange E-visit).  The importance of social distancing was discussed today.  Time:   Today, I  have spent 6 minutes with the patient with telehealth technology discussing the above problems.     Medication Adjustments/Labs and Tests Ordered: Current medicines are reviewed at length with the patient today.  Concerns regarding medicines are outlined above.   Tests Ordered: No orders of the defined types were placed in this encounter.   Medication Changes: No orders of the defined types were placed in this encounter.   Follow Up:  Virtual Visit 1 year.  Signed, Rozann Lesches, MD  09/26/2018 1:53 PM    [facility]

## 2018-09-27 ENCOUNTER — Other Ambulatory Visit: Payer: Self-pay | Admitting: Nurse Practitioner

## 2018-09-27 DIAGNOSIS — K582 Mixed irritable bowel syndrome: Secondary | ICD-10-CM

## 2018-09-27 MED ORDER — LUBIPROSTONE 24 MCG PO CAPS: 24.0000 ug | ORAL_CAPSULE | Freq: Every day | ORAL | 3 refills | Status: DC

## 2018-09-27 NOTE — Addendum Note (Signed)
Addended by: Gordy Levan, Colette Dicamillo A on: 09/27/2018 11:32 AM   Modules accepted: Orders

## 2018-09-27 NOTE — Telephone Encounter (Signed)
I have faxed the new RX and D/C order to [facility] and to [place].

## 2018-09-27 NOTE — Telephone Encounter (Signed)
New Rx printed and signed. Thanks for help with the D/C order.

## 2018-09-28 DIAGNOSIS — I6350 Cerebral infarction due to unspecified occlusion or stenosis of unspecified cerebral artery: Secondary | ICD-10-CM | POA: Diagnosis not present

## 2018-09-28 DIAGNOSIS — I10 Essential (primary) hypertension: Secondary | ICD-10-CM | POA: Diagnosis not present

## 2018-10-01 DIAGNOSIS — M4316 Spondylolisthesis, lumbar region: Secondary | ICD-10-CM | POA: Diagnosis not present

## 2018-10-01 DIAGNOSIS — M48061 Spinal stenosis, lumbar region without neurogenic claudication: Secondary | ICD-10-CM | POA: Diagnosis not present

## 2018-10-01 DIAGNOSIS — F209 Schizophrenia, unspecified: Secondary | ICD-10-CM | POA: Diagnosis not present

## 2018-10-01 DIAGNOSIS — E1151 Type 2 diabetes mellitus with diabetic peripheral angiopathy without gangrene: Secondary | ICD-10-CM | POA: Diagnosis not present

## 2018-10-01 DIAGNOSIS — I69354 Hemiplegia and hemiparesis following cerebral infarction affecting left non-dominant side: Secondary | ICD-10-CM | POA: Diagnosis not present

## 2018-10-01 DIAGNOSIS — M21372 Foot drop, left foot: Secondary | ICD-10-CM | POA: Diagnosis not present

## 2018-10-02 DIAGNOSIS — F209 Schizophrenia, unspecified: Secondary | ICD-10-CM | POA: Diagnosis not present

## 2018-10-02 DIAGNOSIS — M4316 Spondylolisthesis, lumbar region: Secondary | ICD-10-CM | POA: Diagnosis not present

## 2018-10-02 DIAGNOSIS — E1151 Type 2 diabetes mellitus with diabetic peripheral angiopathy without gangrene: Secondary | ICD-10-CM | POA: Diagnosis not present

## 2018-10-02 DIAGNOSIS — I69354 Hemiplegia and hemiparesis following cerebral infarction affecting left non-dominant side: Secondary | ICD-10-CM | POA: Diagnosis not present

## 2018-10-02 DIAGNOSIS — M21372 Foot drop, left foot: Secondary | ICD-10-CM | POA: Diagnosis not present

## 2018-10-02 DIAGNOSIS — M48061 Spinal stenosis, lumbar region without neurogenic claudication: Secondary | ICD-10-CM | POA: Diagnosis not present

## 2018-10-03 DIAGNOSIS — E1151 Type 2 diabetes mellitus with diabetic peripheral angiopathy without gangrene: Secondary | ICD-10-CM | POA: Diagnosis not present

## 2018-10-03 DIAGNOSIS — M48061 Spinal stenosis, lumbar region without neurogenic claudication: Secondary | ICD-10-CM | POA: Diagnosis not present

## 2018-10-03 DIAGNOSIS — M21372 Foot drop, left foot: Secondary | ICD-10-CM | POA: Diagnosis not present

## 2018-10-03 DIAGNOSIS — M4316 Spondylolisthesis, lumbar region: Secondary | ICD-10-CM | POA: Diagnosis not present

## 2018-10-03 DIAGNOSIS — F209 Schizophrenia, unspecified: Secondary | ICD-10-CM | POA: Diagnosis not present

## 2018-10-03 DIAGNOSIS — I69354 Hemiplegia and hemiparesis following cerebral infarction affecting left non-dominant side: Secondary | ICD-10-CM | POA: Diagnosis not present

## 2018-10-07 DIAGNOSIS — I69354 Hemiplegia and hemiparesis following cerebral infarction affecting left non-dominant side: Secondary | ICD-10-CM | POA: Diagnosis not present

## 2018-10-07 DIAGNOSIS — E1151 Type 2 diabetes mellitus with diabetic peripheral angiopathy without gangrene: Secondary | ICD-10-CM | POA: Diagnosis not present

## 2018-10-07 DIAGNOSIS — F209 Schizophrenia, unspecified: Secondary | ICD-10-CM | POA: Diagnosis not present

## 2018-10-07 DIAGNOSIS — M4316 Spondylolisthesis, lumbar region: Secondary | ICD-10-CM | POA: Diagnosis not present

## 2018-10-07 DIAGNOSIS — M48061 Spinal stenosis, lumbar region without neurogenic claudication: Secondary | ICD-10-CM | POA: Diagnosis not present

## 2018-10-07 DIAGNOSIS — M21372 Foot drop, left foot: Secondary | ICD-10-CM | POA: Diagnosis not present

## 2018-10-09 DIAGNOSIS — I69354 Hemiplegia and hemiparesis following cerebral infarction affecting left non-dominant side: Secondary | ICD-10-CM | POA: Diagnosis not present

## 2018-10-09 DIAGNOSIS — M21372 Foot drop, left foot: Secondary | ICD-10-CM | POA: Diagnosis not present

## 2018-10-09 DIAGNOSIS — E1151 Type 2 diabetes mellitus with diabetic peripheral angiopathy without gangrene: Secondary | ICD-10-CM | POA: Diagnosis not present

## 2018-10-09 DIAGNOSIS — F209 Schizophrenia, unspecified: Secondary | ICD-10-CM | POA: Diagnosis not present

## 2018-10-09 DIAGNOSIS — M4316 Spondylolisthesis, lumbar region: Secondary | ICD-10-CM | POA: Diagnosis not present

## 2018-10-09 DIAGNOSIS — M48061 Spinal stenosis, lumbar region without neurogenic claudication: Secondary | ICD-10-CM | POA: Diagnosis not present

## 2018-10-10 DIAGNOSIS — M21372 Foot drop, left foot: Secondary | ICD-10-CM | POA: Diagnosis not present

## 2018-10-10 DIAGNOSIS — M48061 Spinal stenosis, lumbar region without neurogenic claudication: Secondary | ICD-10-CM | POA: Diagnosis not present

## 2018-10-10 DIAGNOSIS — E1151 Type 2 diabetes mellitus with diabetic peripheral angiopathy without gangrene: Secondary | ICD-10-CM | POA: Diagnosis not present

## 2018-10-10 DIAGNOSIS — I69354 Hemiplegia and hemiparesis following cerebral infarction affecting left non-dominant side: Secondary | ICD-10-CM | POA: Diagnosis not present

## 2018-10-10 DIAGNOSIS — M4316 Spondylolisthesis, lumbar region: Secondary | ICD-10-CM | POA: Diagnosis not present

## 2018-10-10 DIAGNOSIS — F209 Schizophrenia, unspecified: Secondary | ICD-10-CM | POA: Diagnosis not present

## 2018-10-14 DIAGNOSIS — M48061 Spinal stenosis, lumbar region without neurogenic claudication: Secondary | ICD-10-CM | POA: Diagnosis not present

## 2018-10-14 DIAGNOSIS — E1151 Type 2 diabetes mellitus with diabetic peripheral angiopathy without gangrene: Secondary | ICD-10-CM | POA: Diagnosis not present

## 2018-10-14 DIAGNOSIS — M21372 Foot drop, left foot: Secondary | ICD-10-CM | POA: Diagnosis not present

## 2018-10-14 DIAGNOSIS — M4316 Spondylolisthesis, lumbar region: Secondary | ICD-10-CM | POA: Diagnosis not present

## 2018-10-14 DIAGNOSIS — I69354 Hemiplegia and hemiparesis following cerebral infarction affecting left non-dominant side: Secondary | ICD-10-CM | POA: Diagnosis not present

## 2018-10-14 DIAGNOSIS — F209 Schizophrenia, unspecified: Secondary | ICD-10-CM | POA: Diagnosis not present

## 2018-10-16 DIAGNOSIS — M48061 Spinal stenosis, lumbar region without neurogenic claudication: Secondary | ICD-10-CM | POA: Diagnosis not present

## 2018-10-16 DIAGNOSIS — I69354 Hemiplegia and hemiparesis following cerebral infarction affecting left non-dominant side: Secondary | ICD-10-CM | POA: Diagnosis not present

## 2018-10-16 DIAGNOSIS — F209 Schizophrenia, unspecified: Secondary | ICD-10-CM | POA: Diagnosis not present

## 2018-10-16 DIAGNOSIS — E1151 Type 2 diabetes mellitus with diabetic peripheral angiopathy without gangrene: Secondary | ICD-10-CM | POA: Diagnosis not present

## 2018-10-16 DIAGNOSIS — M21372 Foot drop, left foot: Secondary | ICD-10-CM | POA: Diagnosis not present

## 2018-10-16 DIAGNOSIS — M4316 Spondylolisthesis, lumbar region: Secondary | ICD-10-CM | POA: Diagnosis not present

## 2018-10-18 DIAGNOSIS — I69354 Hemiplegia and hemiparesis following cerebral infarction affecting left non-dominant side: Secondary | ICD-10-CM | POA: Diagnosis not present

## 2018-10-18 DIAGNOSIS — F209 Schizophrenia, unspecified: Secondary | ICD-10-CM | POA: Diagnosis not present

## 2018-10-18 DIAGNOSIS — M4316 Spondylolisthesis, lumbar region: Secondary | ICD-10-CM | POA: Diagnosis not present

## 2018-10-18 DIAGNOSIS — E1151 Type 2 diabetes mellitus with diabetic peripheral angiopathy without gangrene: Secondary | ICD-10-CM | POA: Diagnosis not present

## 2018-10-18 DIAGNOSIS — M21372 Foot drop, left foot: Secondary | ICD-10-CM | POA: Diagnosis not present

## 2018-10-18 DIAGNOSIS — M48061 Spinal stenosis, lumbar region without neurogenic claudication: Secondary | ICD-10-CM | POA: Diagnosis not present

## 2018-10-19 DIAGNOSIS — R32 Unspecified urinary incontinence: Secondary | ICD-10-CM | POA: Diagnosis not present

## 2018-10-19 DIAGNOSIS — E1151 Type 2 diabetes mellitus with diabetic peripheral angiopathy without gangrene: Secondary | ICD-10-CM | POA: Diagnosis not present

## 2018-10-19 DIAGNOSIS — M48061 Spinal stenosis, lumbar region without neurogenic claudication: Secondary | ICD-10-CM | POA: Diagnosis not present

## 2018-10-19 DIAGNOSIS — Z794 Long term (current) use of insulin: Secondary | ICD-10-CM | POA: Diagnosis not present

## 2018-10-19 DIAGNOSIS — I69354 Hemiplegia and hemiparesis following cerebral infarction affecting left non-dominant side: Secondary | ICD-10-CM | POA: Diagnosis not present

## 2018-10-19 DIAGNOSIS — M21372 Foot drop, left foot: Secondary | ICD-10-CM | POA: Diagnosis not present

## 2018-10-19 DIAGNOSIS — F209 Schizophrenia, unspecified: Secondary | ICD-10-CM | POA: Diagnosis not present

## 2018-10-19 DIAGNOSIS — M4316 Spondylolisthesis, lumbar region: Secondary | ICD-10-CM | POA: Diagnosis not present

## 2018-10-21 DIAGNOSIS — M4316 Spondylolisthesis, lumbar region: Secondary | ICD-10-CM | POA: Diagnosis not present

## 2018-10-21 DIAGNOSIS — E1151 Type 2 diabetes mellitus with diabetic peripheral angiopathy without gangrene: Secondary | ICD-10-CM | POA: Diagnosis not present

## 2018-10-21 DIAGNOSIS — F209 Schizophrenia, unspecified: Secondary | ICD-10-CM | POA: Diagnosis not present

## 2018-10-21 DIAGNOSIS — M21372 Foot drop, left foot: Secondary | ICD-10-CM | POA: Diagnosis not present

## 2018-10-21 DIAGNOSIS — M48061 Spinal stenosis, lumbar region without neurogenic claudication: Secondary | ICD-10-CM | POA: Diagnosis not present

## 2018-10-21 DIAGNOSIS — I69354 Hemiplegia and hemiparesis following cerebral infarction affecting left non-dominant side: Secondary | ICD-10-CM | POA: Diagnosis not present

## 2018-10-22 DIAGNOSIS — M79675 Pain in left toe(s): Secondary | ICD-10-CM | POA: Diagnosis not present

## 2018-10-22 DIAGNOSIS — B351 Tinea unguium: Secondary | ICD-10-CM | POA: Diagnosis not present

## 2018-10-22 DIAGNOSIS — M79674 Pain in right toe(s): Secondary | ICD-10-CM | POA: Diagnosis not present

## 2018-10-25 DIAGNOSIS — M48061 Spinal stenosis, lumbar region without neurogenic claudication: Secondary | ICD-10-CM | POA: Diagnosis not present

## 2018-10-25 DIAGNOSIS — M4316 Spondylolisthesis, lumbar region: Secondary | ICD-10-CM | POA: Diagnosis not present

## 2018-10-25 DIAGNOSIS — F209 Schizophrenia, unspecified: Secondary | ICD-10-CM | POA: Diagnosis not present

## 2018-10-25 DIAGNOSIS — E1151 Type 2 diabetes mellitus with diabetic peripheral angiopathy without gangrene: Secondary | ICD-10-CM | POA: Diagnosis not present

## 2018-10-25 DIAGNOSIS — I69354 Hemiplegia and hemiparesis following cerebral infarction affecting left non-dominant side: Secondary | ICD-10-CM | POA: Diagnosis not present

## 2018-10-25 DIAGNOSIS — M21372 Foot drop, left foot: Secondary | ICD-10-CM | POA: Diagnosis not present

## 2018-10-28 DIAGNOSIS — I6350 Cerebral infarction due to unspecified occlusion or stenosis of unspecified cerebral artery: Secondary | ICD-10-CM | POA: Diagnosis not present

## 2018-10-28 DIAGNOSIS — M48061 Spinal stenosis, lumbar region without neurogenic claudication: Secondary | ICD-10-CM | POA: Diagnosis not present

## 2018-10-28 DIAGNOSIS — Z23 Encounter for immunization: Secondary | ICD-10-CM | POA: Diagnosis not present

## 2018-10-28 DIAGNOSIS — E119 Type 2 diabetes mellitus without complications: Secondary | ICD-10-CM | POA: Diagnosis not present

## 2018-10-28 DIAGNOSIS — M4316 Spondylolisthesis, lumbar region: Secondary | ICD-10-CM | POA: Diagnosis not present

## 2018-10-28 DIAGNOSIS — E1151 Type 2 diabetes mellitus with diabetic peripheral angiopathy without gangrene: Secondary | ICD-10-CM | POA: Diagnosis not present

## 2018-10-28 DIAGNOSIS — I69354 Hemiplegia and hemiparesis following cerebral infarction affecting left non-dominant side: Secondary | ICD-10-CM | POA: Diagnosis not present

## 2018-10-28 DIAGNOSIS — F209 Schizophrenia, unspecified: Secondary | ICD-10-CM | POA: Diagnosis not present

## 2018-10-28 DIAGNOSIS — M21372 Foot drop, left foot: Secondary | ICD-10-CM | POA: Diagnosis not present

## 2018-10-30 ENCOUNTER — Other Ambulatory Visit: Payer: Self-pay

## 2018-10-30 ENCOUNTER — Ambulatory Visit (INDEPENDENT_AMBULATORY_CARE_PROVIDER_SITE_OTHER): Payer: Medicare Other | Admitting: *Deleted

## 2018-10-30 DIAGNOSIS — Z5181 Encounter for therapeutic drug level monitoring: Secondary | ICD-10-CM

## 2018-10-30 DIAGNOSIS — Z8679 Personal history of other diseases of the circulatory system: Secondary | ICD-10-CM | POA: Diagnosis not present

## 2018-10-30 DIAGNOSIS — I82409 Acute embolism and thrombosis of unspecified deep veins of unspecified lower extremity: Secondary | ICD-10-CM | POA: Diagnosis not present

## 2018-10-30 LAB — POCT INR: INR: 2 (ref 2.0–3.0)

## 2018-10-30 NOTE — Patient Instructions (Signed)
Continue warfarin 1/2 tablets daily except 1 tablet on Tuesdays, Thursdays and Saturdays Recheck in 6 weeks

## 2018-10-31 DIAGNOSIS — E1151 Type 2 diabetes mellitus with diabetic peripheral angiopathy without gangrene: Secondary | ICD-10-CM | POA: Diagnosis not present

## 2018-10-31 DIAGNOSIS — F209 Schizophrenia, unspecified: Secondary | ICD-10-CM | POA: Diagnosis not present

## 2018-10-31 DIAGNOSIS — M21372 Foot drop, left foot: Secondary | ICD-10-CM | POA: Diagnosis not present

## 2018-10-31 DIAGNOSIS — I69354 Hemiplegia and hemiparesis following cerebral infarction affecting left non-dominant side: Secondary | ICD-10-CM | POA: Diagnosis not present

## 2018-10-31 DIAGNOSIS — I951 Orthostatic hypotension: Secondary | ICD-10-CM | POA: Diagnosis not present

## 2018-10-31 DIAGNOSIS — M4316 Spondylolisthesis, lumbar region: Secondary | ICD-10-CM | POA: Diagnosis not present

## 2018-10-31 DIAGNOSIS — E1142 Type 2 diabetes mellitus with diabetic polyneuropathy: Secondary | ICD-10-CM | POA: Diagnosis not present

## 2018-10-31 DIAGNOSIS — M48061 Spinal stenosis, lumbar region without neurogenic claudication: Secondary | ICD-10-CM | POA: Diagnosis not present

## 2018-10-31 DIAGNOSIS — I825Y9 Chronic embolism and thrombosis of unspecified deep veins of unspecified proximal lower extremity: Secondary | ICD-10-CM | POA: Diagnosis not present

## 2018-10-31 DIAGNOSIS — J0100 Acute maxillary sinusitis, unspecified: Secondary | ICD-10-CM | POA: Diagnosis not present

## 2018-11-04 ENCOUNTER — Ambulatory Visit (INDEPENDENT_AMBULATORY_CARE_PROVIDER_SITE_OTHER): Payer: Medicare Other | Admitting: "Endocrinology

## 2018-11-04 ENCOUNTER — Other Ambulatory Visit: Payer: Self-pay

## 2018-11-04 ENCOUNTER — Encounter: Payer: Self-pay | Admitting: "Endocrinology

## 2018-11-04 DIAGNOSIS — E1151 Type 2 diabetes mellitus with diabetic peripheral angiopathy without gangrene: Secondary | ICD-10-CM | POA: Diagnosis not present

## 2018-11-04 DIAGNOSIS — M4316 Spondylolisthesis, lumbar region: Secondary | ICD-10-CM | POA: Diagnosis not present

## 2018-11-04 DIAGNOSIS — E1159 Type 2 diabetes mellitus with other circulatory complications: Secondary | ICD-10-CM | POA: Diagnosis not present

## 2018-11-04 DIAGNOSIS — M21372 Foot drop, left foot: Secondary | ICD-10-CM | POA: Diagnosis not present

## 2018-11-04 DIAGNOSIS — M48061 Spinal stenosis, lumbar region without neurogenic claudication: Secondary | ICD-10-CM | POA: Diagnosis not present

## 2018-11-04 DIAGNOSIS — I10 Essential (primary) hypertension: Secondary | ICD-10-CM

## 2018-11-04 DIAGNOSIS — E782 Mixed hyperlipidemia: Secondary | ICD-10-CM | POA: Diagnosis not present

## 2018-11-04 DIAGNOSIS — I69354 Hemiplegia and hemiparesis following cerebral infarction affecting left non-dominant side: Secondary | ICD-10-CM | POA: Diagnosis not present

## 2018-11-04 DIAGNOSIS — F209 Schizophrenia, unspecified: Secondary | ICD-10-CM | POA: Diagnosis not present

## 2018-11-04 MED ORDER — LEVEMIR FLEXTOUCH 100 UNIT/ML ~~LOC~~ SOPN: 28.0000 [IU] | PEN_INJECTOR | Freq: Every day | SUBCUTANEOUS | 2 refills | Status: DC

## 2018-11-04 NOTE — Progress Notes (Signed)
11/04/2018                                                    Endocrinology Telehealth Visit Follow up Note -During COVID -19 Pandemic  This visit type was conducted due to national recommendations for restrictions regarding the COVID-19 Pandemic  in an effort to limit this patient's exposure and mitigate transmission of the corona virus.  Due to her co-morbid illnesses, [name] is at  moderate to high risk for complications without adequate follow up.  This format is felt to be most appropriate for her at this time.  I connected with this patient on 11/04/2018   by telephone and verified that I am speaking with the correct person using two identifiers. [name], 11-23-1945. she has verbally consented to this visit. All issues noted in this document were discussed and addressed. The format was not optimal for physical exam.   -She was assisted by her caregiver, [name], at her nursing home.     Subjective:    Patient ID: [name], female    DOB: [birth year],    Past Medical History:  Diagnosis Date  . Anxiety   . Arthritis   . Dependence on wheelchair    pivot/transfers  . Depression    History of psychosis and previous suicide attempt  . DVT, lower extremity, recurrent (HCC)    Long-term Coumadin per Dr. Legrand Rams  . Essential hypertension   . GERD (gastroesophageal reflux disease)   . Hemiplegia ([place]) 2010   Left side  . History of stroke    Acute infarct and right cerebral white matter small vessel disease 12/10  . Leg DVT (deep venous thromboembolism), acute ([place]) 2006  . Schizophrenia ([place])   . Stroke [facility])    left sided weakness  . Type 2 diabetes mellitus ([place])    Past Surgical History:  Procedure Laterality Date  . BACK SURGERY    . BIOPSY N/A 11/24/2014   Procedure: BIOPSY;  Surgeon: Danie Binder, MD;  Location: [facility];  Service: Endoscopy;  Laterality: N/A;  . BIOPSY  05/17/2016   Procedure: BIOPSY;  Surgeon: Danie Binder, MD;  Location: [facility];  Service: Endoscopy;;  gastric biopsy  . COLONOSCOPY WITH PROPOFOL N/A 11/24/2014   Dr. Rudie Meyer polyps removed/moderate sized internal hemorrhoids, tubular adenomas. Next surveillance in 3 years  . ESOPHAGOGASTRODUODENOSCOPY (EGD) WITH PROPOFOL N/A 11/24/2014   Dr. Clayburn Pert HH/patent stricture at the gastroesophageal junction, mild non-erosive gastritis, path negative for H.pylori or celiac sprue  . ESOPHAGOGASTRODUODENOSCOPY (EGD) WITH PROPOFOL N/A 05/17/2016   Procedure: ESOPHAGOGASTRODUODENOSCOPY (EGD) WITH PROPOFOL;  Surgeon: Danie Binder, MD;  Location: [facility];  Service: Endoscopy;  Laterality: N/A;  12:45pm  . GIVENS CAPSULE STUDY N/A 12/11/2014   MULTILPLE EROSION IN the stomach WITH ACTIVE OOZING. OCCASIONAL EROSIONS AND RARE ULCER SEEN IN PROXIMAL SMALL BOWEL . No masses or AVMs SEEN. NO OLD BLOOD OR FRESH BLOOD SEEN.   . POLYPECTOMY N/A 11/24/2014   Procedure: POLYPECTOMY;  Surgeon: Danie Binder, MD;  Location: [facility];  Service: Endoscopy;  Laterality: N/A;  . SAVORY DILATION N/A 05/17/2016   Procedure: SAVORY DILATION;  Surgeon: Danie Binder, MD;  Location: [facility];  Service: Endoscopy;  Laterality: N/A;   Family History  Problem Relation Age of Onset  .  Hypertension Mother   . Colon cancer Neg Hx     Social History   Socioeconomic History  . Marital status: Widowed    Spouse name: Not on file  . Number of children: Not on file  . Years of education: Not on file  . Highest education level: Not on file  Occupational History  . Not on file  Social Needs  . Financial resource strain: Not on file  . Food insecurity    Worry: Not on file    Inability: Not on file  . Transportation needs    Medical: Not on file    Non-medical: Not on file  Tobacco Use  . Smoking status: Former Smoker    Packs/day: 0.25    Years: 20.00    Pack years: 5.00    Types: Cigarettes    Quit date: 01/24/1995    Years since quitting: 23.7  . Smokeless tobacco: Never  Used  Substance and Sexual Activity  . Alcohol use: No    Alcohol/week: 0.0 standard drinks  . Drug use: No  . Sexual activity: Never    Birth control/protection: Post-menopausal  Lifestyle  . Physical activity    Days per week: Not on file    Minutes per session: Not on file  . Stress: Not on file  Relationships  . Social Herbalist on phone: Not on file    Gets together: Not on file    Attends religious service: Not on file    Active member of club or organization: Not on file    Attends meetings of clubs or organizations: Not on file    Relationship status: Not on file  Other Topics Concern  . Not on file  Social History Narrative  . Not on file   Outpatient Encounter Medications as of 11/04/2018  Medication Sig  . ABILIFY 2 MG tablet Take 1 tablet (2 mg total) by mouth daily.  Marland Kitchen acetaminophen (TYLENOL) 325 MG tablet Take 650 mg by mouth 3 (three) times daily as needed.  . calcium carbonate (TUMS - DOSED IN MG ELEMENTAL CALCIUM) 500 MG chewable tablet Chew 1 tablet by mouth 4 (four) times daily as needed for indigestion or heartburn.   . cholecalciferol (VITAMIN D) 1000 units tablet Take 2,000 Units by mouth daily.  . clonazePAM (KLONOPIN) 0.5 MG tablet Take 1 tablet (0.5 mg total) by mouth 3 (three) times daily.  Marland Kitchen docusate sodium (COLACE) 100 MG capsule Take 200 mg by mouth at bedtime.   . DULoxetine (CYMBALTA) 60 MG capsule Take 120 mg by mouth daily.   . fluticasone (FLONASE) 50 MCG/ACT nasal spray Place into both nostrils as needed for allergies or rhinitis.  Marland Kitchen gabapentin (NEURONTIN) 300 MG capsule Take 300 mg by mouth 3 (three) times daily.  Marland Kitchen HYDROcodone-acetaminophen (NORCO) 10-325 MG tablet Take 1 tablet by mouth every 8 (eight) hours as needed.   . hydrocortisone (ANUSOL-HC) 2.5 % rectal cream Place 1 application rectally 2 (two) times daily as needed.   . Insulin Detemir (LEVEMIR FLEXTOUCH) 100 UNIT/ML Pen Inject 28 Units into the skin at bedtime.  .  iron polysaccharides (NIFEREX) 150 MG capsule Take 1 capsule (150 mg total) by mouth daily.  Marland Kitchen latanoprost (XALATAN) 0.005 % ophthalmic solution Place 1 drop into both eyes at bedtime.  Marland Kitchen lubiprostone (AMITIZA) 24 MCG capsule Take 1 capsule (24 mcg total) by mouth daily.  . metoprolol succinate (TOPROL-XL) 25 MG 24 hr tablet Take 0.5 tablets (12.5 mg total) by  mouth daily. (Patient taking differently: Take 12.5 mg by mouth 2 (two) times daily. )  . midodrine (PROAMATINE) 2.5 MG tablet Take 2.5 mg by mouth 3 (three) times daily with meals.   . mirabegron ER (MYRBETRIQ) 25 MG TB24 tablet Take 25 mg by mouth daily.  . mirtazapine (REMERON) 30 MG tablet Take 30 mg by mouth at bedtime.    Marland Kitchen omeprazole (PRILOSEC) 40 MG capsule Take 40 mg by mouth daily.  . ondansetron (ZOFRAN) 4 MG tablet Take 1 tablet (4 mg total) by mouth 4 (four) times daily -  before meals and at bedtime. (Patient taking differently: Take 4 mg by mouth as needed. )  . polyethylene glycol powder (GLYCOLAX/MIRALAX) powder Take 17 g by mouth daily.  . polyvinyl alcohol (ARTIFICIAL TEARS) 1.4 % ophthalmic solution Place 1 drop into both eyes 3 (three) times daily.  . risperiDONE (RISPERDAL) 1 MG tablet Take 1 tablet (1 mg total) by mouth at bedtime.  . simvastatin (ZOCOR) 20 MG tablet Take 20 mg by mouth at bedtime.   Marland Kitchen tiZANidine (ZANAFLEX) 2 MG tablet Take 2 mg by mouth daily.  Marland Kitchen warfarin (COUMADIN) 5 MG tablet TAKE 1 TABLET (5mg ) BY MOUTH ONCE DAILY ON TUES,THURS,SAT. TAKE 1/2 TABLET (2.5mg ) ON SUN,MON, WED & FRI.  Marland Kitchen zolpidem (AMBIEN) 10 MG tablet Take 1 tablet (10 mg total) by mouth at bedtime. Please hold this medication if she has drowsiness  . [DISCONTINUED] Insulin Detemir (LEVEMIR FLEXTOUCH) 100 UNIT/ML Pen Inject 25 Units into the skin at bedtime.   No facility-administered encounter medications on file as of 11/04/2018.    ALLERGIES: Allergies  Allergen Reactions  . Sulfa Antibiotics Rash  . Sulfonamide Derivatives Rash     REACTION: rash   VACCINATION STATUS: There is no immunization history for the selected administration types on file for this patient.  Diabetes She presents for her follow-up diabetic visit. She has type 2 diabetes mellitus. Onset time: She was diagnosed at approximate age of 75 years. Her disease course has been stable. There are no hypoglycemic associated symptoms. Pertinent negatives for hypoglycemia include no confusion, pallor or seizures. Pertinent negatives for diabetes include no polydipsia, no polyphagia and no polyuria. There are no hypoglycemic complications. Symptoms are stable. Diabetic complications include a CVA. Risk factors for coronary artery disease include dyslipidemia, diabetes mellitus, obesity, sedentary lifestyle and hypertension. Current diabetic treatment includes intensive insulin program and oral agent (monotherapy) (She kept requiring large dose of insulin due to consumption of large quantities of processed carbohydrates.Marland Kitchen). Her weight is stable. She is following a generally unhealthy diet. When asked about meal planning, she reported none. She never participates in exercise. Her home blood glucose trend is decreasing steadily. Her breakfast blood glucose range is generally 130-140 mg/dl. Her bedtime blood glucose range is generally 140-180 mg/dl. Her overall blood glucose range is 140-180 mg/dl. An ACE inhibitor/angiotensin II receptor blocker is being taken.  Hypertension This is a chronic problem. The current episode started more than 1 year ago. The problem is uncontrolled (Was found to have hypotension of 77/48, repeat 113/67.). Pertinent negatives include no palpitations or shortness of breath. Risk factors for coronary artery disease include dyslipidemia and diabetes mellitus. Past treatments include beta blockers (She is currently on a low-dose metoprolol.). Hypertensive end-organ damage includes CVA.  Hyperlipidemia This is a chronic problem. The current episode  started more than 1 year ago. Exacerbating diseases include diabetes. Pertinent negatives include no shortness of breath. Risk factors for coronary artery disease include diabetes  mellitus, dyslipidemia, hypertension, obesity and a sedentary lifestyle.    Review of systems: Limited as above.  Objective:    There were no vitals taken for this visit.  Wt Readings from Last 3 Encounters:  09/26/18 125 lb (56.7 kg)  07/12/18 138 lb 14.2 oz (63 kg)  07/11/18 138 lb 14.2 oz (63 kg)     Results for orders placed or performed in visit on 10/30/18  POCT INR  Result Value Ref Range   INR 2.0 2.0 - 3.0   Complete Blood Count (Most recent): Lab Results  Component Value Date   WBC 8.7 03/25/2018   HGB 12.3 03/25/2018   HCT 36.0 03/25/2018   MCV 88.5 03/25/2018   PLT 357 03/25/2018   Lab Results  Component Value Date   NA 139 06/27/2018   K 4.1 06/27/2018   CL 100 06/27/2018   CO2 31 06/27/2018   BUN 9 06/27/2018   CREATININE 0.60 06/27/2018   Diabetic Labs (most recent): Lab Results  Component Value Date   HGBA1C 7.2 (H) 06/27/2018   HGBA1C 6.3 (H) 03/15/2018   HGBA1C 5.8 (H) 11/23/2017   Lipid Panel     Component Value Date/Time   CHOL 133 10/07/2016 0620   TRIG 252 (H) 10/07/2016 0620   HDL 28 (L) 10/07/2016 0620   CHOLHDL 4.8 10/07/2016 0620   VLDL 50 (H) 10/07/2016 0620   LDLCALC 55 10/07/2016 0620     Assessment & Plan:   1. Type 2 diabetes mellitus with vascular disease ([place]) Her diabetes is  complicated by recurrent CVA. She was seen with her caretaker Tammy, reporting near target glycemia and her recent A1c was 7.2%.    - Patient remains at a high risk for more acute and chronic complications of diabetes which include CAD, CVA, CKD, retinopathy, and neuropathy. These are all discussed in detail with the patient.  - I have re-counseled the patient on diet management and weight loss  by adopting a carbohydrate restricted / protein rich  Diet.  - Patient is  advised to stick to a routine mealtimes to eat 3 meals  a day and avoid unnecessary snacks ( to snack only to correct hypoglycemia).  - I have approached patient with the following individualized plan to manage diabetes and patient agrees.   -Due to her unpredictable oral food intake, she was advised to stay off of the NovoLog. -She is advised to increase her Levemir to 28 units nightly, continue to monitor blood glucose 2 times a day-daily before breakfast and at bedtime.    -Patient is encouraged to call clinic for blood glucose levels less than 70 or above 300 mg /dl.  - she is not a suitable candidate for metformin nor SGLT2 inhibitor therapy.   - Patient specific target  for A1c; LDL, HDL, Triglycerides, and  Waist Circumference were discussed in detail.  2) BP/HTN: she is advised to home monitor blood pressure and report if > 140/90 on 2 separate readings.   She is only on a low-dose metoprolol.  Her blood pressure needs follow-up.   3) Lipids/HPL: Her recent lipid panel from September 2018 showed LDL of 55.  She is advised to continue simvastatin 20 mg p.o. nightly.      4) Chronic Care/Health Maintenance:  -Continue to need one-on-one assistance at mealtimes as well as diligent hydration.  Patient needs to keep her appointment with Ophthalmology, Podiatrist at least yearly or according to recommendations, and advised to  stay away from smoking. I  have recommended yearly flu vaccine and pneumonia vaccination at least every 5 years; and  sleep for at least 7 hours a day.  I advised patient to maintain close follow up with her PCP for primary care needs.  - Patient Care Time Today:  25 min, of which >50% was spent in  counseling and the rest reviewing her  current and  previous labs/studies, previous treatments, her blood glucose readings, and medications' doses and developing a plan for long-term care based on the latest recommendations for standards of care.   [name]  participated in the discussions, expressed understanding, and voiced agreement with the above plans.  All questions were answered to her satisfaction. she is encouraged to contact clinic should she have any questions or concerns prior to her return visit.   Follow up plan: Return in about 9 weeks (around 01/06/2019) for Bring Meter and Logs- A1c in Office.  Glade Lloyd, MD Phone: [phone]  Fax: [phone]   This note was partially dictated with voice recognition software. Similar sounding words can be transcribed inadequately or may not  be corrected upon review.  11/04/2018, 9:33 AM

## 2018-11-06 DIAGNOSIS — I69354 Hemiplegia and hemiparesis following cerebral infarction affecting left non-dominant side: Secondary | ICD-10-CM | POA: Diagnosis not present

## 2018-11-06 DIAGNOSIS — F209 Schizophrenia, unspecified: Secondary | ICD-10-CM | POA: Diagnosis not present

## 2018-11-06 DIAGNOSIS — E1151 Type 2 diabetes mellitus with diabetic peripheral angiopathy without gangrene: Secondary | ICD-10-CM | POA: Diagnosis not present

## 2018-11-06 DIAGNOSIS — M48061 Spinal stenosis, lumbar region without neurogenic claudication: Secondary | ICD-10-CM | POA: Diagnosis not present

## 2018-11-06 DIAGNOSIS — M21372 Foot drop, left foot: Secondary | ICD-10-CM | POA: Diagnosis not present

## 2018-11-06 DIAGNOSIS — M4316 Spondylolisthesis, lumbar region: Secondary | ICD-10-CM | POA: Diagnosis not present

## 2018-11-12 DIAGNOSIS — M21372 Foot drop, left foot: Secondary | ICD-10-CM | POA: Diagnosis not present

## 2018-11-12 DIAGNOSIS — M48061 Spinal stenosis, lumbar region without neurogenic claudication: Secondary | ICD-10-CM | POA: Diagnosis not present

## 2018-11-12 DIAGNOSIS — F209 Schizophrenia, unspecified: Secondary | ICD-10-CM | POA: Diagnosis not present

## 2018-11-12 DIAGNOSIS — M4316 Spondylolisthesis, lumbar region: Secondary | ICD-10-CM | POA: Diagnosis not present

## 2018-11-12 DIAGNOSIS — E1151 Type 2 diabetes mellitus with diabetic peripheral angiopathy without gangrene: Secondary | ICD-10-CM | POA: Diagnosis not present

## 2018-11-12 DIAGNOSIS — I69354 Hemiplegia and hemiparesis following cerebral infarction affecting left non-dominant side: Secondary | ICD-10-CM | POA: Diagnosis not present

## 2018-11-13 DIAGNOSIS — F209 Schizophrenia, unspecified: Secondary | ICD-10-CM | POA: Diagnosis not present

## 2018-11-13 DIAGNOSIS — M961 Postlaminectomy syndrome, not elsewhere classified: Secondary | ICD-10-CM | POA: Diagnosis not present

## 2018-11-13 DIAGNOSIS — I671 Cerebral aneurysm, nonruptured: Secondary | ICD-10-CM | POA: Diagnosis not present

## 2018-11-13 DIAGNOSIS — M545 Low back pain: Secondary | ICD-10-CM | POA: Diagnosis not present

## 2018-11-13 DIAGNOSIS — M21372 Foot drop, left foot: Secondary | ICD-10-CM | POA: Diagnosis not present

## 2018-11-13 DIAGNOSIS — I69354 Hemiplegia and hemiparesis following cerebral infarction affecting left non-dominant side: Secondary | ICD-10-CM | POA: Diagnosis not present

## 2018-11-13 DIAGNOSIS — M4316 Spondylolisthesis, lumbar region: Secondary | ICD-10-CM | POA: Diagnosis not present

## 2018-11-13 DIAGNOSIS — M48061 Spinal stenosis, lumbar region without neurogenic claudication: Secondary | ICD-10-CM | POA: Diagnosis not present

## 2018-11-13 DIAGNOSIS — E1151 Type 2 diabetes mellitus with diabetic peripheral angiopathy without gangrene: Secondary | ICD-10-CM | POA: Diagnosis not present

## 2018-11-14 ENCOUNTER — Encounter: Payer: Self-pay | Admitting: Nurse Practitioner

## 2018-11-14 ENCOUNTER — Ambulatory Visit (INDEPENDENT_AMBULATORY_CARE_PROVIDER_SITE_OTHER): Payer: Medicare Other | Admitting: Nurse Practitioner

## 2018-11-14 ENCOUNTER — Other Ambulatory Visit: Payer: Self-pay

## 2018-11-14 ENCOUNTER — Telehealth: Payer: Self-pay

## 2018-11-14 VITALS — BP 106/61 | HR 65 | Temp 96.2°F | Ht 65.0 in | Wt 117.0 lb

## 2018-11-14 DIAGNOSIS — K625 Hemorrhage of anus and rectum: Secondary | ICD-10-CM | POA: Diagnosis not present

## 2018-11-14 DIAGNOSIS — K5900 Constipation, unspecified: Secondary | ICD-10-CM

## 2018-11-14 DIAGNOSIS — K582 Mixed irritable bowel syndrome: Secondary | ICD-10-CM

## 2018-11-14 DIAGNOSIS — R1032 Left lower quadrant pain: Secondary | ICD-10-CM | POA: Diagnosis not present

## 2018-11-14 DIAGNOSIS — R1031 Right lower quadrant pain: Secondary | ICD-10-CM

## 2018-11-14 MED ORDER — HYDROCORTISONE (PERIANAL) 2.5 % EX CREA: 1.0000 "application " | TOPICAL_CREAM | Freq: Two times a day (BID) | CUTANEOUS | 1 refills | Status: DC | PRN

## 2018-11-14 MED ORDER — LUBIPROSTONE 24 MCG PO CAPS: 24.0000 ug | ORAL_CAPSULE | Freq: Two times a day (BID) | ORAL | 3 refills | Status: DC

## 2018-11-14 NOTE — Assessment & Plan Note (Signed)
Intermittent bilateral lower abdominal pain that is worse when she is having a bowel movement.  Associated constipation as per above.  I think her abdominal pain is most likely related to her constipation.  With better constipation management her pain should improve.  Continue to monitor, call for any worsening or severe symptoms.  Follow-up in 3 months.

## 2018-11-14 NOTE — Telephone Encounter (Signed)
[facility]armac[facility] at [facility] called office requesting for new Miralax rx be sent in. States pt is at a facility and they can't have an order with a range (order needs to be for either 1 or 2 times daily).

## 2018-11-14 NOTE — Assessment & Plan Note (Signed)
The patient does note rectal bleeding of a minimal to moderate amount when she is constipated and having significant straining.  This is associated with rectal discomfort.  Setting of known hemorrhoids.  At this point I will recommend Anusol twice a day, for up to 10 times a day, as needed for rectal bleeding and hemorrhoid symptoms.  Follow-up in 3 months.

## 2018-11-14 NOTE — Assessment & Plan Note (Signed)
The patient complains of worsening constipation.  She strains but every stool, has hard stools.  Associated abdominal pain as per below.  Her current bowel regimen is a once daily stool softener, Amitiza once a day, MiraLAX as needed.  She states she has not had MiraLAX in some time.  At this point I will recommend that he increase her Amitiza 24 mcg to twice a day as is typical prescription.  If she has diarrhea that lasts more than 1 to 2 days on this and he can reduce it to twice a day alternated with once a day, every other day.  MiraLAX for no bowel movement in 2 days.  She has a as needed antidiarrheal if she should develop significant diarrhea.  Follow-up in 3 months.

## 2018-11-14 NOTE — Patient Instructions (Signed)
Your health issues we discussed today were:   Abdominal pain with constipation and rectal bleeding: 1. I have sent a prescription to your pharmacy for Anusol rectal cream that she can use up to twice a day for up to 10 days at a time for rectal discomfort and/or rectal bleeding 2. Continue taking Colace daily 3. Increase Amitiza 24 mcg twice daily with meals. 4. If you have diarrhea for more than 2 days the facility can change this to Amitiza twice a day alternated with Amitiza once a day, every other day 5. Use MiraLAX 1-2 times a day for no bowel movement in 2 days until you have a good bowel movement 6. Use antidiarrhea medicine as needed 7. Call us for any worsening or severe symptoms  Overall I recommend:  1. Continue your other current medications 2. Follow-up in 3 months 3. Call us if you have any questions or concerns.   Because of recent events of COVID-19 ("Coronavirus"), follow CDC recommendations:  1. Wash your hand frequently 2. Avoid touching your face 3. Stay away from people who are sick 4. If you have symptoms such as fever, cough, shortness of breath then call your healthcare provider for further guidance 5. If you are sick, STAY AT HOME unless otherwise directed by your healthcare provider. 6. Follow directions from state and national officials regarding staying safe   At [facility] we value your feedback. You may receive a survey about your visit today. Please share your experience as we strive to create trusting relationships with our patients to provide genuine, compassionate, quality care.  We appreciate your understanding and patience as we review any laboratory studies, imaging, and other diagnostic tests that are ordered as we care for you. Our office policy is 5 business days for review of these results, and any emergent or urgent results are addressed in a timely manner for your best interest. If you do not hear from our office in 1 week,  please contact us.   We also encourage the use of MyChart, which contains your medical information for your review as well. If you are not enrolled in this feature, an access code is on this after visit summary for your convenience. Thank you for allowing [place] to be involved in your care.  It was great to see you today!  I hope you have a great Fall!!

## 2018-11-14 NOTE — Progress Notes (Signed)
Referring Provider: [name], MD Primary Care Physician:  [name], MD Primary GI:  Dr. Oneida Alar  Chief Complaint  Patient presents with  . Hemorrhoids    bleeding  . Abdominal Pain    HPI:   [name] is a 73 y.o. female who presents for follow-up on constipation and diarrhea.  The patient was last seen in our office 09/09/2018 for the same.  Noted history of IBS and IDA.  Previously worked up with colonoscopy/EGD/capsule study in 2016 and felt IDA related to erosive gastropathy and enteropathy with recommended repeat colonoscopy in 2019.  Also notable history of liver hemangioma on imaging.  EGD updated 2018 with chronic gastritis negative for H. pylori.  She lives at a nursing home.  IBS mixed type previously controlled with a regimen of MiraLAX and Imodium as needed.  At her last visit she was having more significant constipation with a bowel movement twice a week, her facility stopped one of her constipation medications and currently only on Amitiza 8 mcg twice daily, stool softener, and MiraLAX as needed.  Hemorrhoid symptoms including rectal discomfort and toilet tissue hematochezia.  Feels she has lost 20 pounds in 6 to 9 months.  Appetite not great but uses Ensure supplements.  No other GI complaints.  Recommended increasing Amitiza to 24 mcg twice daily, continue stool softener daily as needed and MiraLAX twice daily as needed.  Follow-up in 2 months.  Today she states she's doing ok overall. She is accompanied by a staff member from the facility. Started having abdominal pain a week ago. Pain is achy in nature, intermittent, typically lasts a day, occurs after a bowel movement about 3 times a week, always associated with a bowel moment. Having worsening constipation and notes has to strain with every bowel movement, typically hard stools (although sometimes soft). Has been having hematochezia with straining, minimal to medium in amount. Associated rectal discomfort,  denies itching. Hasn't had any hemorrhoid cream. Denies N/V, melena, fever, chills, unintentional weight loss. Denies URI or flu-like symptoms. Denies loss of sense of taste or smell.  Staff states no known cases at the facility. Denies chest pain, dyspnea, dizziness, lightheadedness, syncope, near syncope. Denies any other upper or lower GI symptoms.  Current bowel regimen: Colace daily, Amitiza 24 mcg qd, MiraLAX prn. (Staff thinks she gets MiraLAX as needed.   Past Medical History:  Diagnosis Date  . Anxiety   . Arthritis   . Dependence on wheelchair    pivot/transfers  . Depression    History of psychosis and previous suicide attempt  . DVT, lower extremity, recurrent (HCC)    Long-term Coumadin per Dr. Legrand Rams  . Essential hypertension   . GERD (gastroesophageal reflux disease)   . Hemiplegia ([place]) 2010   Left side  . History of stroke    Acute infarct and right cerebral white matter small vessel disease 12/10  . Leg DVT (deep venous thromboembolism), acute ([place]) 2006  . Schizophrenia ([place])   . Stroke [facility])    left sided weakness  . Type 2 diabetes mellitus ([place])     Past Surgical History:  Procedure Laterality Date  . BACK SURGERY    . BIOPSY N/A 11/24/2014   Procedure: BIOPSY;  Surgeon: Danie Binder, MD;  Location: [facility];  Service: Endoscopy;  Laterality: N/A;  . BIOPSY  05/17/2016   Procedure: BIOPSY;  Surgeon: Danie Binder, MD;  Location: [facility];  Service: Endoscopy;;  gastric biopsy  . COLONOSCOPY  WITH PROPOFOL N/A 11/24/2014   Dr. Rudie Meyer polyps removed/moderate sized internal hemorrhoids, tubular adenomas. Next surveillance in 3 years  . ESOPHAGOGASTRODUODENOSCOPY (EGD) WITH PROPOFOL N/A 11/24/2014   Dr. Clayburn Pert HH/patent stricture at the gastroesophageal junction, mild non-erosive gastritis, path negative for H.pylori or celiac sprue  . ESOPHAGOGASTRODUODENOSCOPY (EGD) WITH PROPOFOL N/A 05/17/2016   Procedure: ESOPHAGOGASTRODUODENOSCOPY (EGD) WITH  PROPOFOL;  Surgeon: Danie Binder, MD;  Location: [facility];  Service: Endoscopy;  Laterality: N/A;  12:45pm  . GIVENS CAPSULE STUDY N/A 12/11/2014   MULTILPLE EROSION IN the stomach WITH ACTIVE OOZING. OCCASIONAL EROSIONS AND RARE ULCER SEEN IN PROXIMAL SMALL BOWEL . No masses or AVMs SEEN. NO OLD BLOOD OR FRESH BLOOD SEEN.   . POLYPECTOMY N/A 11/24/2014   Procedure: POLYPECTOMY;  Surgeon: Danie Binder, MD;  Location: [facility];  Service: Endoscopy;  Laterality: N/A;  . SAVORY DILATION N/A 05/17/2016   Procedure: SAVORY DILATION;  Surgeon: Danie Binder, MD;  Location: [facility];  Service: Endoscopy;  Laterality: N/A;    Current Outpatient Medications  Medication Sig Dispense Refill  . ABILIFY 2 MG tablet Take 1 tablet (2 mg total) by mouth daily. 28 tablet 2  . acetaminophen (TYLENOL) 325 MG tablet Take 650 mg by mouth 3 (three) times daily as needed.    . calcium carbonate (TUMS - DOSED IN MG ELEMENTAL CALCIUM) 500 MG chewable tablet Chew 1 tablet by mouth 4 (four) times daily as needed for indigestion or heartburn.     . cholecalciferol (VITAMIN D) 1000 units tablet Take 2,000 Units by mouth daily.    . clonazePAM (KLONOPIN) 0.5 MG tablet Take 1 tablet (0.5 mg total) by mouth 3 (three) times daily. (Patient taking differently: Take 0.5 mg by mouth 3 (three) times daily as needed. ) 90 tablet 2  . docusate sodium (COLACE) 100 MG capsule Take 200 mg by mouth at bedtime.     . DULoxetine (CYMBALTA) 60 MG capsule Take 120 mg by mouth daily.     Marland Kitchen esomeprazole (NEXIUM) 40 MG capsule Take 40 mg by mouth daily at 12 noon.    . gabapentin (NEURONTIN) 300 MG capsule Take 300 mg by mouth 3 (three) times daily.    Marland Kitchen HYDROcodone-acetaminophen (NORCO) 10-325 MG tablet Take 1 tablet by mouth every 8 (eight) hours as needed.     . hydrocortisone (ANUSOL-HC) 2.5 % rectal cream Place 1 application rectally 2 (two) times daily as needed.     . Insulin Detemir (LEVEMIR FLEXTOUCH) 100 UNIT/ML Pen Inject  28 Units into the skin at bedtime. 15 mL 2  . iron polysaccharides (NIFEREX) 150 MG capsule Take 1 capsule (150 mg total) by mouth daily. 30 capsule 11  . latanoprost (XALATAN) 0.005 % ophthalmic solution Place 1 drop into both eyes at bedtime.    Marland Kitchen loratadine (CLARITIN) 10 MG tablet Take 10 mg by mouth daily.    Marland Kitchen lubiprostone (AMITIZA) 24 MCG capsule Take 1 capsule (24 mcg total) by mouth daily. 90 capsule 3  . metoprolol succinate (TOPROL-XL) 25 MG 24 hr tablet Take 0.5 tablets (12.5 mg total) by mouth daily. (Patient taking differently: Take 12.5 mg by mouth 2 (two) times daily. )    . midodrine (PROAMATINE) 2.5 MG tablet Take 5 mg by mouth 3 (three) times daily with meals.     . mirabegron ER (MYRBETRIQ) 25 MG TB24 tablet Take 25 mg by mouth daily.    . mirtazapine (REMERON) 30 MG tablet Take 30 mg by  mouth at bedtime.      . ondansetron (ZOFRAN) 4 MG tablet Take 1 tablet (4 mg total) by mouth 4 (four) times daily -  before meals and at bedtime. (Patient taking differently: Take 4 mg by mouth as needed. ) 120 tablet 3  . pantoprazole (PROTONIX) 40 MG tablet Take 40 mg by mouth daily.    . polyethylene glycol powder (GLYCOLAX/MIRALAX) powder Take 17 g by mouth daily. (Patient taking differently: Take 17 g by mouth as needed. ) 255 g 0  . risperiDONE (RISPERDAL) 1 MG tablet Take 1 tablet (1 mg total) by mouth at bedtime. 28 tablet 2  . simvastatin (ZOCOR) 20 MG tablet Take 20 mg by mouth at bedtime.     Marland Kitchen tiZANidine (ZANAFLEX) 2 MG tablet Take 2 mg by mouth daily.    Marland Kitchen warfarin (COUMADIN) 5 MG tablet TAKE 1 TABLET (5mg ) BY MOUTH ONCE DAILY ON TUES,THURS,SAT. TAKE 1/2 TABLET (2.5mg ) ON SUN,MON, WED & FRI. 26 tablet 6  . zolpidem (AMBIEN) 10 MG tablet Take 1 tablet (10 mg total) by mouth at bedtime. Please hold this medication if she has drowsiness 30 tablet 1   No current facility-administered medications for this visit.     Allergies as of 11/14/2018 - Review Complete 11/14/2018  Allergen  Reaction Noted  . Sulfa antibiotics Rash 02/23/2010  . Sulfonamide derivatives Rash     Family History  Problem Relation Age of Onset  . Hypertension Mother   . Colon cancer Neg Hx     Social History   Socioeconomic History  . Marital status: Widowed    Spouse name: Not on file  . Number of children: Not on file  . Years of education: Not on file  . Highest education level: Not on file  Occupational History  . Not on file  Social Needs  . Financial resource strain: Not on file  . Food insecurity    Worry: Not on file    Inability: Not on file  . Transportation needs    Medical: Not on file    Non-medical: Not on file  Tobacco Use  . Smoking status: Former Smoker    Packs/day: 0.25    Years: 20.00    Pack years: 5.00    Types: Cigarettes    Quit date: 01/24/1995    Years since quitting: 23.8  . Smokeless tobacco: Never Used  Substance and Sexual Activity  . Alcohol use: No    Alcohol/week: 0.0 standard drinks  . Drug use: No  . Sexual activity: Never    Birth control/protection: Post-menopausal  Lifestyle  . Physical activity    Days per week: Not on file    Minutes per session: Not on file  . Stress: Not on file  Relationships  . Social Herbalist on phone: Not on file    Gets together: Not on file    Attends religious service: Not on file    Active member of club or organization: Not on file    Attends meetings of clubs or organizations: Not on file    Relationship status: Not on file  Other Topics Concern  . Not on file  Social History Narrative  . Not on file    Review of Systems: General: Negative for anorexia, weight loss, fever, chills, fatigue, weakness. ENT: Negative for hoarseness, difficulty swallowing. CV: Negative for chest pain, angina, palpitations, peripheral edema.  Respiratory: Negative for dyspnea at rest, cough, sputum, wheezing.  GI: See history of  present illness. Endo: Negative for unusual weight change.  Heme:  Negative for bruising or bleeding. Allergy: Negative for rash or hives.   Physical Exam: BP 106/61   Pulse 65   Temp (!) 96.2 F (35.7 C) (Temporal)   Ht 5\' 5"  (1.651 m)   Wt 117 lb (53.1 kg)   BMI 19.47 kg/m  General:   Alert and oriented. Pleasant and cooperative. Well-nourished and well-developed.  Eyes:  Without icterus, sclera clear and conjunctiva pink.  Ears:  Normal auditory acuity. Cardiovascular:  S1, S2 present without murmurs appreciated.  Extremities without clubbing or edema. Respiratory:  Clear to auscultation bilaterally. No wheezes, rales, or rhonchi. No distress.  Gastrointestinal:  +BS, soft, and non-distended. Mild lower abdominal TTP. No HSM noted. No guarding or rebound. No masses appreciated.  Rectal:  Deferred  Musculoskalatal:  Symmetrical without gross deformities. Neurologic:  Alert and oriented x4;  grossly normal neurologically. Psych:  Alert and cooperative. Normal mood and affect. Heme/Lymph/Immune: No excessive bruising noted.    11/14/2018 10:44 AM   Disclaimer: This note was dictated with voice recognition software. Similar sounding words can inadvertently be transcribed and may not be corrected upon review.

## 2018-11-15 MED ORDER — POLYETHYLENE GLYCOL 3350 17 GM/SCOOP PO POWD: 17.0000 g | Freq: Two times a day (BID) | ORAL | 0 refills | Status: DC | PRN

## 2018-11-15 NOTE — Telephone Encounter (Signed)
Resent Rx as bid prn straining or no BM in 2 days

## 2018-11-18 DIAGNOSIS — R32 Unspecified urinary incontinence: Secondary | ICD-10-CM | POA: Diagnosis not present

## 2018-11-18 DIAGNOSIS — M21372 Foot drop, left foot: Secondary | ICD-10-CM | POA: Diagnosis not present

## 2018-11-18 DIAGNOSIS — E1151 Type 2 diabetes mellitus with diabetic peripheral angiopathy without gangrene: Secondary | ICD-10-CM | POA: Diagnosis not present

## 2018-11-18 DIAGNOSIS — Z794 Long term (current) use of insulin: Secondary | ICD-10-CM | POA: Diagnosis not present

## 2018-11-18 DIAGNOSIS — I69354 Hemiplegia and hemiparesis following cerebral infarction affecting left non-dominant side: Secondary | ICD-10-CM | POA: Diagnosis not present

## 2018-11-18 DIAGNOSIS — M4316 Spondylolisthesis, lumbar region: Secondary | ICD-10-CM | POA: Diagnosis not present

## 2018-11-18 DIAGNOSIS — M48061 Spinal stenosis, lumbar region without neurogenic claudication: Secondary | ICD-10-CM | POA: Diagnosis not present

## 2018-11-18 DIAGNOSIS — F209 Schizophrenia, unspecified: Secondary | ICD-10-CM | POA: Diagnosis not present

## 2018-11-18 NOTE — Telephone Encounter (Signed)
Noted  

## 2018-11-19 NOTE — Progress Notes (Signed)
Virtual Visit via Telephone Note  I connected with [name] on 11/26/18 at 11:20 AM EST by telephone and verified that I am speaking with the correct person using two identifiers.   I discussed the limitations, risks, security and privacy concerns of performing an evaluation and management service by telephone and the availability of in person appointments. I also discussed with the patient that there may be a patient responsible charge related to this service. The patient expressed understanding and agreed to proceed.     I discussed the assessment and treatment plan with the patient. The patient was provided an opportunity to ask questions and all were answered. The patient agreed with the plan and demonstrated an understanding of the instructions.   The patient was advised to call back or seek an in-person evaluation if the symptoms worsen or if the condition fails to improve as anticipated.  I provided 15 minutes of non-face-to-face time during this encounter.   [name], MD    [facility] MD/PA/NP OP Progress Note  11/26/2018 11:44 AM JAYLEIGH Bauer  MRN:  [id number]  Chief Complaint:  Chief Complaint    Follow-up; Other     HPI:  This is a follow-up appointment for schizophrenia and depression.  She states that she has been doing pretty good.  She enjoys watching game shows.  Although she wakes up feeling sad, she usually feels better after taking medication.  Her granddaughter visits the patient once a month, and she is able to see her through the window.  She also enjoys talking with her granddaughter on the phone every day, who lives in [place].  She reports good appetite.  She denies insomnia.  She has fair concentration and motivation.  She denies SI.  She denies AH, VH or paranoia.  She feels anxious.  She denies panic attacks.   The staff at [facility] presents to the interview.  She has been doing well. Although there were a few times she declined to eat meals,  she usually eats well on the next day. No behavioral or safety concern. She sees Dr. Legrand Rams regularly, and has been getting annual check up.    Visit Diagnosis:    ICD-10-CM   1. Schizophrenia, unspecified type ([place])  F20.9 ABILIFY 2 MG tablet    zolpidem (AMBIEN) 10 MG tablet    Past Psychiatric History: Please see initial evaluation for full details. I have reviewed the history. No updates at this time.     Past Medical History:  Past Medical History:  Diagnosis Date  . Anxiety   . Arthritis   . Dependence on wheelchair    pivot/transfers  . Depression    History of psychosis and previous suicide attempt  . DVT, lower extremity, recurrent (HCC)    Long-term Coumadin per Dr. Legrand Rams  . Essential hypertension   . GERD (gastroesophageal reflux disease)   . Hemiplegia ([place]) 2010   Left side  . History of stroke    Acute infarct and right cerebral white matter small vessel disease 12/10  . Leg DVT (deep venous thromboembolism), acute ([place]) 2006  . Schizophrenia ([place])   . Stroke [facility])    left sided weakness  . Type 2 diabetes mellitus ([place])     Past Surgical History:  Procedure Laterality Date  . BACK SURGERY    . BIOPSY N/A 11/24/2014   Procedure: BIOPSY;  Surgeon: Danie Binder, MD;  Location: [facility] ORS;  Service: Endoscopy;  Laterality: N/A;  . BIOPSY  05/17/2016   Procedure: BIOPSY;  Surgeon: Danie Binder, MD;  Location: [facility] ENDO SUITE;  Service: Endoscopy;;  gastric biopsy  . COLONOSCOPY WITH PROPOFOL N/A 11/24/2014   Dr. Rudie Meyer polyps removed/moderate sized internal hemorrhoids, tubular adenomas. Next surveillance in 3 years  . ESOPHAGOGASTRODUODENOSCOPY (EGD) WITH PROPOFOL N/A 11/24/2014   Dr. Clayburn Pert HH/patent stricture at the gastroesophageal junction, mild non-erosive gastritis, path negative for H.pylori or celiac sprue  . ESOPHAGOGASTRODUODENOSCOPY (EGD) WITH PROPOFOL N/A 05/17/2016   Procedure: ESOPHAGOGASTRODUODENOSCOPY (EGD) WITH PROPOFOL;  Surgeon: Danie Binder, MD;  Location: [facility] ENDO SUITE;  Service: Endoscopy;  Laterality: N/A;  12:45pm  . GIVENS CAPSULE STUDY N/A 12/11/2014   MULTILPLE EROSION IN the stomach WITH ACTIVE OOZING. OCCASIONAL EROSIONS AND RARE ULCER SEEN IN PROXIMAL SMALL BOWEL . No masses or AVMs SEEN. NO OLD BLOOD OR FRESH BLOOD SEEN.   . POLYPECTOMY N/A 11/24/2014   Procedure: POLYPECTOMY;  Surgeon: Danie Binder, MD;  Location: [facility] ORS;  Service: Endoscopy;  Laterality: N/A;  . SAVORY DILATION N/A 05/17/2016   Procedure: SAVORY DILATION;  Surgeon: Danie Binder, MD;  Location: [facility] ENDO SUITE;  Service: Endoscopy;  Laterality: N/A;    Family Psychiatric History: Please see initial evaluation for full details. I have reviewed the history. No updates at this time.     Family History:  Family History  Problem Relation Age of Onset  . Hypertension Mother   . Colon cancer Neg Hx     Social History:  Social History   Socioeconomic History  . Marital status: Widowed    Spouse name: Not on file  . Number of children: Not on file  . Years of education: Not on file  . Highest education level: Not on file  Occupational History  . Not on file  Social Needs  . Financial resource strain: Not on file  . Food insecurity    Worry: Not on file    Inability: Not on file  . Transportation needs    Medical: Not on file    Non-medical: Not on file  Tobacco Use  . Smoking status: Former Smoker    Packs/day: 0.25    Years: 20.00    Pack years: 5.00    Types: Cigarettes    Quit date: 01/24/1995    Years since quitting: 23.8  . Smokeless tobacco: Never Used  Substance and Sexual Activity  . Alcohol use: No    Alcohol/week: 0.0 standard drinks  . Drug use: No  . Sexual activity: Never    Birth control/protection: Post-menopausal  Lifestyle  . Physical activity    Days per week: Not on file    Minutes per session: Not on file  . Stress: Not on file  Relationships  . Social Herbalist on phone: Not on file     Gets together: Not on file    Attends religious service: Not on file    Active member of club or organization: Not on file    Attends meetings of clubs or organizations: Not on file    Relationship status: Not on file  Other Topics Concern  . Not on file  Social History Narrative  . Not on file    Allergies:  Allergies  Allergen Reactions  . Sulfa Antibiotics Rash  . Sulfonamide Derivatives Rash    REACTION: rash    Metabolic Disorder Labs: Lab Results  Component Value Date   HGBA1C 7.2 (H) 06/27/2018   MPG 160 06/27/2018  MPG 134 03/15/2018   No results found for: PROLACTIN Lab Results  Component Value Date   CHOL 133 10/07/2016   TRIG 252 (H) 10/07/2016   HDL 28 (L) 10/07/2016   CHOLHDL 4.8 10/07/2016   VLDL 50 (H) 10/07/2016   LDLCALC 55 10/07/2016   LDLCALC (H) 01/03/2009    143        Total Cholesterol/HDL:CHD Risk Coronary Heart Disease Risk Table                     Men   Women  1/2 Average Risk   3.4   3.3  Average Risk       5.0   4.4  2 X Average Risk   9.6   7.1  3 X Average Risk  23.4   11.0        Use the calculated Patient Ratio above and the CHD Risk Table to determine the patient's CHD Risk.        ATP III CLASSIFICATION (LDL):  <100     mg/dL   Optimal  100-129  mg/dL   Near or Above                    Optimal  130-159  mg/dL   Borderline  160-189  mg/dL   High  >190     mg/dL   Very High   Lab Results  Component Value Date   TSH 1.138 03/29/2017   TSH 0.538 02/13/2016    Therapeutic Level Labs: No results found for: LITHIUM No results found for: VALPROATE No components found for:  CBMZ  Current Medications: Current Outpatient Medications  Medication Sig Dispense Refill  . ABILIFY 2 MG tablet Take 1 tablet (2 mg total) by mouth daily. 28 tablet 3  . acetaminophen (TYLENOL) 325 MG tablet Take 650 mg by mouth 3 (three) times daily as needed.    . calcium carbonate (TUMS - DOSED IN MG ELEMENTAL CALCIUM) 500 MG chewable tablet  Chew 1 tablet by mouth 4 (four) times daily as needed for indigestion or heartburn.     . cholecalciferol (VITAMIN D) 1000 units tablet Take 2,000 Units by mouth daily.    . clonazePAM (KLONOPIN) 0.5 MG tablet Take 1 tablet (0.5 mg total) by mouth 3 (three) times daily. (Patient taking differently: Take 0.5 mg by mouth 3 (three) times daily as needed. ) 90 tablet 2  . docusate sodium (COLACE) 100 MG capsule Take 200 mg by mouth at bedtime.     . DULoxetine (CYMBALTA) 60 MG capsule Take 120 mg by mouth daily.     Marland Kitchen esomeprazole (NEXIUM) 40 MG capsule Take 40 mg by mouth daily at 12 noon.    . gabapentin (NEURONTIN) 300 MG capsule Take 300 mg by mouth 3 (three) times daily.    Marland Kitchen HYDROcodone-acetaminophen (NORCO) 10-325 MG tablet Take 1 tablet by mouth every 8 (eight) hours as needed.     . hydrocortisone (ANUSOL-HC) 2.5 % rectal cream Place 1 application rectally 2 (two) times daily as needed.     . hydrocortisone (ANUSOL-HC) 2.5 % rectal cream Place 1 application rectally 2 (two) times daily as needed (rectal discomfort of bleeding; up to 10 days at a time). 30 g 1  . Insulin Detemir (LEVEMIR FLEXTOUCH) 100 UNIT/ML Pen Inject 28 Units into the skin at bedtime. 15 mL 2  . iron polysaccharides (NIFEREX) 150 MG capsule Take 1 capsule (150 mg total) by mouth daily. 30 capsule 11  .  latanoprost (XALATAN) 0.005 % ophthalmic solution Place 1 drop into both eyes at bedtime.    Marland Kitchen loratadine (CLARITIN) 10 MG tablet Take 10 mg by mouth daily.    Marland Kitchen lubiprostone (AMITIZA) 24 MCG capsule Take 1 capsule (24 mcg total) by mouth 2 (two) times daily with a meal. 60 capsule 3  . metoprolol succinate (TOPROL-XL) 25 MG 24 hr tablet Take 0.5 tablets (12.5 mg total) by mouth daily. (Patient taking differently: Take 12.5 mg by mouth 2 (two) times daily. )    . midodrine (PROAMATINE) 2.5 MG tablet Take 5 mg by mouth 3 (three) times daily with meals.     . mirabegron ER (MYRBETRIQ) 25 MG TB24 tablet Take 25 mg by mouth daily.     . ondansetron (ZOFRAN) 4 MG tablet Take 1 tablet (4 mg total) by mouth 4 (four) times daily -  before meals and at bedtime. (Patient taking differently: Take 4 mg by mouth as needed. ) 120 tablet 3  . pantoprazole (PROTONIX) 40 MG tablet Take 40 mg by mouth daily.    . polyethylene glycol powder (GLYCOLAX/MIRALAX) 17 GM/SCOOP powder Take 17 g by mouth 2 (two) times daily as needed (straining or no BM in 2 days). 255 g 0  . risperiDONE (RISPERDAL) 1 MG tablet Take 1 tablet (1 mg total) by mouth at bedtime. 28 tablet 3  . simvastatin (ZOCOR) 20 MG tablet Take 20 mg by mouth at bedtime.     Marland Kitchen tiZANidine (ZANAFLEX) 2 MG tablet Take 2 mg by mouth daily.    Marland Kitchen warfarin (COUMADIN) 5 MG tablet TAKE 1 TABLET (5mg ) BY MOUTH ONCE DAILY ON TUES,THURS,SAT. TAKE 1/2 TABLET (2.5mg ) ON SUN,MON, WED & FRI. 26 tablet 6  . zolpidem (AMBIEN) 10 MG tablet Take 1 tablet (10 mg total) by mouth at bedtime. Please hold this medication if she has drowsiness 30 tablet 3   No current facility-administered medications for this visit.      Musculoskeletal: Strength & Muscle Tone: N/A Gait & Station: N/A Patient leans: N/A  Psychiatric Specialty Exam: ROS  There were no vitals taken for this visit.There is no height or weight on file to calculate BMI.  General Appearance: NA  Eye Contact:  NA  Speech:  Clear and Coherent  Volume:  Normal  Mood:  "good"  Affect:  NA  Thought Process:  Coherent  Orientation:  Full (Time, Place, and Person)  Thought Content: Logical   Suicidal Thoughts:  No  Homicidal Thoughts:  No  Memory:  Immediate;   Good  Judgement:  Fair  Insight:  Shallow  Psychomotor Activity:  Normal  Concentration:  Concentration: Good and Attention Span: Good  Recall:  [place] of Knowledge: Good  Language: Good  Akathisia:  No  Handed:  Right  AIMS (if indicated): not done  Assets:  Social Support  ADL's:  Intact  Cognition: Impaired,  Mild  Sleep:  Good   Screenings: PHQ2-9      Office Visit from 11/28/2017 in [facility] Office Visit from 09/25/2017 in [facility] Office Visit from 03/28/2017 in [facility] Office Visit from 09/13/2016 in [facility] Office Visit from 06/07/2016 in [facility]  PHQ-2 Total Score  0  0  0  0  0       Assessment and Plan:  NORAN TEMPLET is a 72 y.o. year old female with a history of schizophrenia, depression, SVT, DVT, history of stroke on warfarin, type II  diabetes, hypertension, GERD , who presents for follow up appointment for Schizophrenia, unspecified type ([place]) - Plan: ABILIFY 2 MG tablet, zolpidem (AMBIEN) 10 MG tablet  # Schizophrenia by history # MDD She continues to demonstrate linear thought process, which has been consistent since initial evaluation.  There has been no known psychotic episode or behavior issues since the last visit.  We will continue duloxetine to target depression and anxiety.  Will continue Abilify adjunctive treatment for depression.  We will continue Risperdal to target mood dysregulation.  Noted that although it is preferable to do monotherapy, she had worsening in depressive symptoms in the context of tapering off risperidone.  Discussed potential metabolic side effect and EPS.  Will continue Ambien as needed for insomnia; will plan to taper it off in the future to avoid its potential risk of fall.   # Cognitive deficits She is oriented on evaluation while she does have cognitive deficits, which is characterized in Mini cog. Will continue to assess.  Plan I have reviewed and updated plans as below 1. Continue duloxetine 120 mg daily (prescribed by Dr. Legrand Rams.)   2. ContinueAbilify2 mg daily 3.Continue Risperidone 1 mg at night(worsening in decreased appetite/withdrawn when tapering off this medication) 4.ContinueZolpidem 10 mg at night  5.Continueclonazepam 0.5 mg twicea day for  anxiety (prescribed by Dr. Merlene Laughter) 6.Next appointment: in January  - Fax AVS to [phone] 7. Obtain record from Dr. Peggye Form- pending -on gabapentin 300 mg TID   The patient demonstrates the following risk factors for suicide: Chronic risk factors for suicide include:psychiatric disorder ofschizophrenia by history, depression. Acute risk factorsfor suicide include: unemployment. Protective factorsfor this patient include: positive social support and hope for the future. Considering these factors, the overall suicide risk at this point appears to below. Patientisappropriate for outpatient follow up.  [name], MD 11/26/2018, 11:44 AM

## 2018-11-26 ENCOUNTER — Other Ambulatory Visit: Payer: Self-pay

## 2018-11-26 ENCOUNTER — Ambulatory Visit (INDEPENDENT_AMBULATORY_CARE_PROVIDER_SITE_OTHER): Payer: Medicare Other | Admitting: Psychiatry

## 2018-11-26 ENCOUNTER — Encounter (HOSPITAL_COMMUNITY): Payer: Self-pay | Admitting: Psychiatry

## 2018-11-26 DIAGNOSIS — F209 Schizophrenia, unspecified: Secondary | ICD-10-CM

## 2018-11-26 MED ORDER — ABILIFY 2 MG PO TABS: 2.0000 mg | ORAL_TABLET | Freq: Every day | ORAL | 3 refills | Status: DC

## 2018-11-26 MED ORDER — ZOLPIDEM TARTRATE 10 MG PO TABS: 10.0000 mg | ORAL_TABLET | Freq: Every day | ORAL | 3 refills | Status: DC

## 2018-11-26 MED ORDER — RISPERIDONE 1 MG PO TABS: 1.0000 mg | ORAL_TABLET | Freq: Every day | ORAL | 3 refills | Status: DC

## 2018-11-26 NOTE — Patient Instructions (Signed)
1. Continue duloxetine 120 mg daily  2. ContinueAbilify2 mg daily 3.Continue Risperidone 1 mg at night 4.ContinueZolpidem 10 mg at night  5.Continueclonazepam 0.5 mg twicea day for anxiety  6.Next appointment: in January  - Fax AVS to [phone]

## 2018-12-05 ENCOUNTER — Other Ambulatory Visit: Payer: Self-pay | Admitting: Gastroenterology

## 2018-12-05 ENCOUNTER — Telehealth: Payer: Self-pay

## 2018-12-05 DIAGNOSIS — R1032 Left lower quadrant pain: Secondary | ICD-10-CM

## 2018-12-05 DIAGNOSIS — R1031 Right lower quadrant pain: Secondary | ICD-10-CM

## 2018-12-05 DIAGNOSIS — K625 Hemorrhage of anus and rectum: Secondary | ICD-10-CM

## 2018-12-05 DIAGNOSIS — K5900 Constipation, unspecified: Secondary | ICD-10-CM

## 2018-12-05 MED ORDER — LUBIPROSTONE 24 MCG PO CAPS: 24.0000 ug | ORAL_CAPSULE | Freq: Two times a day (BID) | ORAL | 3 refills | Status: DC

## 2018-12-05 NOTE — Telephone Encounter (Signed)
Received a letter from [facility] for pt's Amitiza 24 mcg, one capsule bid.  Pt's prescription benefit covers 90 day supply at a retail pharmacy with a Copay that is equal or less than three individual 30 day supplies. Last RX with 3 refills was sent in on 11/14/2018. Please send 90 day supply of medication.

## 2018-12-05 NOTE — Telephone Encounter (Signed)
Noted  

## 2018-12-05 NOTE — Telephone Encounter (Signed)
90 day supply Amitiza 24 mcg BID with 3 refills sent to [facility]

## 2018-12-11 ENCOUNTER — Ambulatory Visit (INDEPENDENT_AMBULATORY_CARE_PROVIDER_SITE_OTHER): Payer: Medicare Other | Admitting: *Deleted

## 2018-12-11 ENCOUNTER — Other Ambulatory Visit: Payer: Self-pay

## 2018-12-11 DIAGNOSIS — Z5181 Encounter for therapeutic drug level monitoring: Secondary | ICD-10-CM | POA: Diagnosis not present

## 2018-12-11 DIAGNOSIS — Z8679 Personal history of other diseases of the circulatory system: Secondary | ICD-10-CM

## 2018-12-11 DIAGNOSIS — I6930 Unspecified sequelae of cerebral infarction: Secondary | ICD-10-CM | POA: Diagnosis not present

## 2018-12-11 LAB — POCT INR: INR: 1.7 — AB (ref 2.0–3.0)

## 2018-12-11 MED ORDER — WARFARIN SODIUM 5 MG PO TABS: ORAL_TABLET | ORAL | 6 refills | Status: DC

## 2018-12-11 NOTE — Patient Instructions (Signed)
Take warfarin 5mg  tonight then increase dose to 5mg  daily except 2.5mg  on Mondays, Wednesdays and Fridays Recheck in 4 weeks New Rx orders sent to [facility]

## 2018-12-13 ENCOUNTER — Other Ambulatory Visit: Payer: Self-pay

## 2018-12-13 ENCOUNTER — Telehealth: Payer: Self-pay

## 2018-12-13 NOTE — Progress Notes (Signed)
Opened in error

## 2018-12-13 NOTE — Telephone Encounter (Signed)
T/c from [place] at [facility] saying they received pt's Amitiza 24 mcg from [facility] and pt gets hers at [facility] and has packaged.  The request came here from [place] and was sent to refill box and was filled.  [name] said she will check on the new package from [facility] which comes in on 23 rd.  She will check in the package and see if pt has the [place] and let us know.

## 2018-12-18 DIAGNOSIS — E1151 Type 2 diabetes mellitus with diabetic peripheral angiopathy without gangrene: Secondary | ICD-10-CM | POA: Diagnosis not present

## 2018-12-18 DIAGNOSIS — M48061 Spinal stenosis, lumbar region without neurogenic claudication: Secondary | ICD-10-CM | POA: Diagnosis not present

## 2018-12-18 DIAGNOSIS — Z794 Long term (current) use of insulin: Secondary | ICD-10-CM | POA: Diagnosis not present

## 2018-12-18 DIAGNOSIS — R32 Unspecified urinary incontinence: Secondary | ICD-10-CM | POA: Diagnosis not present

## 2018-12-18 DIAGNOSIS — M4316 Spondylolisthesis, lumbar region: Secondary | ICD-10-CM | POA: Diagnosis not present

## 2018-12-18 DIAGNOSIS — F209 Schizophrenia, unspecified: Secondary | ICD-10-CM | POA: Diagnosis not present

## 2018-12-18 DIAGNOSIS — I69354 Hemiplegia and hemiparesis following cerebral infarction affecting left non-dominant side: Secondary | ICD-10-CM | POA: Diagnosis not present

## 2018-12-18 DIAGNOSIS — M21372 Foot drop, left foot: Secondary | ICD-10-CM | POA: Diagnosis not present

## 2018-12-24 DIAGNOSIS — I69354 Hemiplegia and hemiparesis following cerebral infarction affecting left non-dominant side: Secondary | ICD-10-CM | POA: Diagnosis not present

## 2018-12-24 DIAGNOSIS — M4316 Spondylolisthesis, lumbar region: Secondary | ICD-10-CM | POA: Diagnosis not present

## 2018-12-24 DIAGNOSIS — M48061 Spinal stenosis, lumbar region without neurogenic claudication: Secondary | ICD-10-CM | POA: Diagnosis not present

## 2018-12-24 DIAGNOSIS — E1151 Type 2 diabetes mellitus with diabetic peripheral angiopathy without gangrene: Secondary | ICD-10-CM | POA: Diagnosis not present

## 2018-12-24 DIAGNOSIS — F209 Schizophrenia, unspecified: Secondary | ICD-10-CM | POA: Diagnosis not present

## 2018-12-24 DIAGNOSIS — B351 Tinea unguium: Secondary | ICD-10-CM | POA: Diagnosis not present

## 2018-12-24 DIAGNOSIS — M79674 Pain in right toe(s): Secondary | ICD-10-CM | POA: Diagnosis not present

## 2018-12-24 DIAGNOSIS — M21372 Foot drop, left foot: Secondary | ICD-10-CM | POA: Diagnosis not present

## 2018-12-24 DIAGNOSIS — M79675 Pain in left toe(s): Secondary | ICD-10-CM | POA: Diagnosis not present

## 2018-12-26 DIAGNOSIS — Z20828 Contact with and (suspected) exposure to other viral communicable diseases: Secondary | ICD-10-CM | POA: Diagnosis not present

## 2018-12-26 DIAGNOSIS — U071 COVID-19: Secondary | ICD-10-CM | POA: Diagnosis not present

## 2018-12-30 DIAGNOSIS — M21372 Foot drop, left foot: Secondary | ICD-10-CM | POA: Diagnosis not present

## 2018-12-30 DIAGNOSIS — Z20828 Contact with and (suspected) exposure to other viral communicable diseases: Secondary | ICD-10-CM | POA: Diagnosis not present

## 2018-12-30 DIAGNOSIS — M48061 Spinal stenosis, lumbar region without neurogenic claudication: Secondary | ICD-10-CM | POA: Diagnosis not present

## 2018-12-30 DIAGNOSIS — E1151 Type 2 diabetes mellitus with diabetic peripheral angiopathy without gangrene: Secondary | ICD-10-CM | POA: Diagnosis not present

## 2018-12-30 DIAGNOSIS — I69354 Hemiplegia and hemiparesis following cerebral infarction affecting left non-dominant side: Secondary | ICD-10-CM | POA: Diagnosis not present

## 2018-12-30 DIAGNOSIS — M4316 Spondylolisthesis, lumbar region: Secondary | ICD-10-CM | POA: Diagnosis not present

## 2018-12-30 DIAGNOSIS — U071 COVID-19: Secondary | ICD-10-CM | POA: Diagnosis not present

## 2018-12-30 DIAGNOSIS — F209 Schizophrenia, unspecified: Secondary | ICD-10-CM | POA: Diagnosis not present

## 2019-01-02 DIAGNOSIS — M48061 Spinal stenosis, lumbar region without neurogenic claudication: Secondary | ICD-10-CM | POA: Diagnosis not present

## 2019-01-02 DIAGNOSIS — M4316 Spondylolisthesis, lumbar region: Secondary | ICD-10-CM | POA: Diagnosis not present

## 2019-01-02 DIAGNOSIS — F209 Schizophrenia, unspecified: Secondary | ICD-10-CM | POA: Diagnosis not present

## 2019-01-02 DIAGNOSIS — I69354 Hemiplegia and hemiparesis following cerebral infarction affecting left non-dominant side: Secondary | ICD-10-CM | POA: Diagnosis not present

## 2019-01-02 DIAGNOSIS — E1151 Type 2 diabetes mellitus with diabetic peripheral angiopathy without gangrene: Secondary | ICD-10-CM | POA: Diagnosis not present

## 2019-01-02 DIAGNOSIS — M21372 Foot drop, left foot: Secondary | ICD-10-CM | POA: Diagnosis not present

## 2019-01-06 ENCOUNTER — Ambulatory Visit: Payer: Medicare Other | Admitting: "Endocrinology

## 2019-01-07 ENCOUNTER — Telehealth: Payer: Self-pay | Admitting: Cardiology

## 2019-01-07 DIAGNOSIS — E1151 Type 2 diabetes mellitus with diabetic peripheral angiopathy without gangrene: Secondary | ICD-10-CM | POA: Diagnosis not present

## 2019-01-07 DIAGNOSIS — M48061 Spinal stenosis, lumbar region without neurogenic claudication: Secondary | ICD-10-CM | POA: Diagnosis not present

## 2019-01-07 DIAGNOSIS — I69354 Hemiplegia and hemiparesis following cerebral infarction affecting left non-dominant side: Secondary | ICD-10-CM | POA: Diagnosis not present

## 2019-01-07 DIAGNOSIS — F209 Schizophrenia, unspecified: Secondary | ICD-10-CM | POA: Diagnosis not present

## 2019-01-07 DIAGNOSIS — M4316 Spondylolisthesis, lumbar region: Secondary | ICD-10-CM | POA: Diagnosis not present

## 2019-01-07 DIAGNOSIS — M21372 Foot drop, left foot: Secondary | ICD-10-CM | POA: Diagnosis not present

## 2019-01-07 NOTE — Telephone Encounter (Signed)
Patient has tested positive for COVID.   Asking for orders to check her in center

## 2019-01-07 NOTE — Telephone Encounter (Signed)
New Order:   [facility] since patient has tested positive for COVID 19 and cannot come into the office.   VO: Dr Inocente Salles [place] Joneen Caraway RN          [facility]

## 2019-01-08 DIAGNOSIS — M48061 Spinal stenosis, lumbar region without neurogenic claudication: Secondary | ICD-10-CM | POA: Diagnosis not present

## 2019-01-08 DIAGNOSIS — F209 Schizophrenia, unspecified: Secondary | ICD-10-CM | POA: Diagnosis not present

## 2019-01-08 DIAGNOSIS — E1151 Type 2 diabetes mellitus with diabetic peripheral angiopathy without gangrene: Secondary | ICD-10-CM | POA: Diagnosis not present

## 2019-01-08 DIAGNOSIS — I69354 Hemiplegia and hemiparesis following cerebral infarction affecting left non-dominant side: Secondary | ICD-10-CM | POA: Diagnosis not present

## 2019-01-08 DIAGNOSIS — M21372 Foot drop, left foot: Secondary | ICD-10-CM | POA: Diagnosis not present

## 2019-01-08 DIAGNOSIS — M4316 Spondylolisthesis, lumbar region: Secondary | ICD-10-CM | POA: Diagnosis not present

## 2019-01-09 ENCOUNTER — Ambulatory Visit (INDEPENDENT_AMBULATORY_CARE_PROVIDER_SITE_OTHER): Payer: Medicare Other | Admitting: *Deleted

## 2019-01-09 DIAGNOSIS — Z5181 Encounter for therapeutic drug level monitoring: Secondary | ICD-10-CM

## 2019-01-09 DIAGNOSIS — I82409 Acute embolism and thrombosis of unspecified deep veins of unspecified lower extremity: Secondary | ICD-10-CM | POA: Diagnosis not present

## 2019-01-09 DIAGNOSIS — I69354 Hemiplegia and hemiparesis following cerebral infarction affecting left non-dominant side: Secondary | ICD-10-CM | POA: Diagnosis not present

## 2019-01-09 DIAGNOSIS — M21372 Foot drop, left foot: Secondary | ICD-10-CM | POA: Diagnosis not present

## 2019-01-09 DIAGNOSIS — M4316 Spondylolisthesis, lumbar region: Secondary | ICD-10-CM | POA: Diagnosis not present

## 2019-01-09 DIAGNOSIS — M48061 Spinal stenosis, lumbar region without neurogenic claudication: Secondary | ICD-10-CM | POA: Diagnosis not present

## 2019-01-09 DIAGNOSIS — F209 Schizophrenia, unspecified: Secondary | ICD-10-CM | POA: Diagnosis not present

## 2019-01-09 DIAGNOSIS — E1151 Type 2 diabetes mellitus with diabetic peripheral angiopathy without gangrene: Secondary | ICD-10-CM | POA: Diagnosis not present

## 2019-01-09 LAB — POCT INR: INR: 2.6 (ref 2.0–3.0)

## 2019-01-09 NOTE — Patient Instructions (Signed)
Continue warfarin 5mg  daily except 2.5mg  on Mondays, Wednesdays and Fridays Recheck in 1 week Orders given to [employer]

## 2019-01-13 DIAGNOSIS — M4316 Spondylolisthesis, lumbar region: Secondary | ICD-10-CM | POA: Diagnosis not present

## 2019-01-13 DIAGNOSIS — M21372 Foot drop, left foot: Secondary | ICD-10-CM | POA: Diagnosis not present

## 2019-01-13 DIAGNOSIS — E1151 Type 2 diabetes mellitus with diabetic peripheral angiopathy without gangrene: Secondary | ICD-10-CM | POA: Diagnosis not present

## 2019-01-13 DIAGNOSIS — F209 Schizophrenia, unspecified: Secondary | ICD-10-CM | POA: Diagnosis not present

## 2019-01-13 DIAGNOSIS — M48061 Spinal stenosis, lumbar region without neurogenic claudication: Secondary | ICD-10-CM | POA: Diagnosis not present

## 2019-01-13 DIAGNOSIS — I69354 Hemiplegia and hemiparesis following cerebral infarction affecting left non-dominant side: Secondary | ICD-10-CM | POA: Diagnosis not present

## 2019-01-14 DIAGNOSIS — F209 Schizophrenia, unspecified: Secondary | ICD-10-CM | POA: Diagnosis not present

## 2019-01-14 DIAGNOSIS — E1151 Type 2 diabetes mellitus with diabetic peripheral angiopathy without gangrene: Secondary | ICD-10-CM | POA: Diagnosis not present

## 2019-01-14 DIAGNOSIS — M21372 Foot drop, left foot: Secondary | ICD-10-CM | POA: Diagnosis not present

## 2019-01-14 DIAGNOSIS — I69354 Hemiplegia and hemiparesis following cerebral infarction affecting left non-dominant side: Secondary | ICD-10-CM | POA: Diagnosis not present

## 2019-01-14 DIAGNOSIS — M48061 Spinal stenosis, lumbar region without neurogenic claudication: Secondary | ICD-10-CM | POA: Diagnosis not present

## 2019-01-14 DIAGNOSIS — M4316 Spondylolisthesis, lumbar region: Secondary | ICD-10-CM | POA: Diagnosis not present

## 2019-01-15 ENCOUNTER — Other Ambulatory Visit (HOSPITAL_COMMUNITY)
Admission: RE | Admit: 2019-01-15 | Discharge: 2019-01-15 | Disposition: A | Discharge status: Home / Self Care | Payer: Medicare Other | Source: Ambulatory Visit | Attending: Cardiology | Admitting: Cardiology

## 2019-01-15 ENCOUNTER — Telehealth: Payer: Self-pay | Admitting: *Deleted

## 2019-01-15 DIAGNOSIS — Z7901 Long term (current) use of anticoagulants: Secondary | ICD-10-CM | POA: Diagnosis present

## 2019-01-15 DIAGNOSIS — I69354 Hemiplegia and hemiparesis following cerebral infarction affecting left non-dominant side: Secondary | ICD-10-CM | POA: Diagnosis not present

## 2019-01-15 DIAGNOSIS — F209 Schizophrenia, unspecified: Secondary | ICD-10-CM | POA: Diagnosis not present

## 2019-01-15 DIAGNOSIS — M21372 Foot drop, left foot: Secondary | ICD-10-CM | POA: Diagnosis not present

## 2019-01-15 DIAGNOSIS — I4891 Unspecified atrial fibrillation: Secondary | ICD-10-CM | POA: Diagnosis present

## 2019-01-15 DIAGNOSIS — M48061 Spinal stenosis, lumbar region without neurogenic claudication: Secondary | ICD-10-CM | POA: Diagnosis not present

## 2019-01-15 DIAGNOSIS — M4316 Spondylolisthesis, lumbar region: Secondary | ICD-10-CM | POA: Diagnosis not present

## 2019-01-15 DIAGNOSIS — E1151 Type 2 diabetes mellitus with diabetic peripheral angiopathy without gangrene: Secondary | ICD-10-CM | POA: Diagnosis not present

## 2019-01-15 LAB — CBC
HCT: 29.8 % — ABNORMAL LOW (ref 36.0–46.0)
Hemoglobin: 10 g/dL — ABNORMAL LOW (ref 12.0–15.0)
MCH: 30 pg (ref 26.0–34.0)
MCHC: 33.6 g/dL (ref 30.0–36.0)
MCV: 89.5 fL (ref 80.0–100.0)
Platelets: 430 10*3/uL — ABNORMAL HIGH (ref 150–400)
RBC: 3.33 MIL/uL — ABNORMAL LOW (ref 3.87–5.11)
RDW: 12.7 % (ref 11.5–15.5)
WBC: 7.2 10*3/uL (ref 4.0–10.5)
nRBC: 0 % (ref 0.0–0.2)

## 2019-01-15 NOTE — Telephone Encounter (Signed)
Received call from [facility] at [facility] lab.  She received Blood sample for PT/INR drawn by Home Health.  She tried to run sample and reading came up 10 but she is not sure the sample is good.  Last INR reading in the office was 2.6. Pt is being checked by Home Health cause she tested positive for COVID and facility wanted it drawn there.    Spoke with [employer].  Told her to have facility hold pt's warfarin tonight and redrawn INR in the morning.  SNF to monitor pt for bleeding and falls.  If any adverse effects pt to go to ED at [facility].

## 2019-01-16 ENCOUNTER — Other Ambulatory Visit (HOSPITAL_COMMUNITY)
Admission: RE | Admit: 2019-01-16 | Discharge: 2019-01-16 | Disposition: A | Discharge status: Home / Self Care | Payer: Medicare Other | Source: Ambulatory Visit | Attending: Cardiology | Admitting: Cardiology

## 2019-01-16 ENCOUNTER — Other Ambulatory Visit: Payer: Self-pay | Admitting: *Deleted

## 2019-01-16 ENCOUNTER — Telehealth: Payer: Self-pay

## 2019-01-16 ENCOUNTER — Telehealth: Payer: Self-pay | Admitting: *Deleted

## 2019-01-16 DIAGNOSIS — Z5181 Encounter for therapeutic drug level monitoring: Secondary | ICD-10-CM

## 2019-01-16 DIAGNOSIS — I82409 Acute embolism and thrombosis of unspecified deep veins of unspecified lower extremity: Secondary | ICD-10-CM

## 2019-01-16 DIAGNOSIS — Z7901 Long term (current) use of anticoagulants: Secondary | ICD-10-CM | POA: Diagnosis present

## 2019-01-16 LAB — PROTIME-INR
INR: 9.4 (ref 0.8–1.2)
Prothrombin Time: 76.4 seconds — ABNORMAL HIGH (ref 11.4–15.2)

## 2019-01-16 NOTE — Telephone Encounter (Signed)
-----   Message from Herminio Commons, MD sent at 01/16/2019  9:40 AM EST ----- Anemic with hemoglobin of 10.  Please forward copy to PCP.

## 2019-01-16 NOTE — Telephone Encounter (Addendum)
Received call from [facility] at [facility] with STAT INR results on patient.  INR was 9.4 this this morning.    Orders given to [facility] with [facility] for pt to hold warfarin x 4 days (Th,Fri,Sat,Sun) and redraw PT/INR on Monday 12/28 with results to me.  Called [place] with above orders. Orders given to [facility].  Continue to monitor pt for signs of bleeding and continue fall precautions.  Report to [facility] ED for bleeding or falls  Orders faxed to [facility] at [phone]  CBC was obtained at same time as PT/INR.  Hgb low at 10.0.  Results faxed to Dr Legrand Rams, pt's PCP.

## 2019-01-16 NOTE — Telephone Encounter (Signed)
Pt made aware, voiced understanding. Copy to pcp.  

## 2019-01-17 DIAGNOSIS — Z794 Long term (current) use of insulin: Secondary | ICD-10-CM | POA: Diagnosis not present

## 2019-01-17 DIAGNOSIS — M4316 Spondylolisthesis, lumbar region: Secondary | ICD-10-CM | POA: Diagnosis not present

## 2019-01-17 DIAGNOSIS — R32 Unspecified urinary incontinence: Secondary | ICD-10-CM | POA: Diagnosis not present

## 2019-01-17 DIAGNOSIS — M21372 Foot drop, left foot: Secondary | ICD-10-CM | POA: Diagnosis not present

## 2019-01-17 DIAGNOSIS — Z7901 Long term (current) use of anticoagulants: Secondary | ICD-10-CM | POA: Diagnosis not present

## 2019-01-17 DIAGNOSIS — Z5181 Encounter for therapeutic drug level monitoring: Secondary | ICD-10-CM | POA: Diagnosis not present

## 2019-01-17 DIAGNOSIS — M48061 Spinal stenosis, lumbar region without neurogenic claudication: Secondary | ICD-10-CM | POA: Diagnosis not present

## 2019-01-17 DIAGNOSIS — R5383 Other fatigue: Secondary | ICD-10-CM | POA: Diagnosis not present

## 2019-01-17 DIAGNOSIS — E1151 Type 2 diabetes mellitus with diabetic peripheral angiopathy without gangrene: Secondary | ICD-10-CM | POA: Diagnosis not present

## 2019-01-17 DIAGNOSIS — U071 COVID-19: Secondary | ICD-10-CM | POA: Diagnosis not present

## 2019-01-17 DIAGNOSIS — F209 Schizophrenia, unspecified: Secondary | ICD-10-CM | POA: Diagnosis not present

## 2019-01-17 DIAGNOSIS — I69354 Hemiplegia and hemiparesis following cerebral infarction affecting left non-dominant side: Secondary | ICD-10-CM | POA: Diagnosis not present

## 2019-01-20 ENCOUNTER — Ambulatory Visit (INDEPENDENT_AMBULATORY_CARE_PROVIDER_SITE_OTHER): Payer: Medicare Other | Admitting: *Deleted

## 2019-01-20 DIAGNOSIS — U071 COVID-19: Secondary | ICD-10-CM | POA: Diagnosis not present

## 2019-01-20 DIAGNOSIS — R5383 Other fatigue: Secondary | ICD-10-CM | POA: Diagnosis not present

## 2019-01-20 DIAGNOSIS — M4316 Spondylolisthesis, lumbar region: Secondary | ICD-10-CM | POA: Diagnosis not present

## 2019-01-20 DIAGNOSIS — Z5181 Encounter for therapeutic drug level monitoring: Secondary | ICD-10-CM | POA: Diagnosis not present

## 2019-01-20 DIAGNOSIS — I69354 Hemiplegia and hemiparesis following cerebral infarction affecting left non-dominant side: Secondary | ICD-10-CM | POA: Diagnosis not present

## 2019-01-20 DIAGNOSIS — M48061 Spinal stenosis, lumbar region without neurogenic claudication: Secondary | ICD-10-CM | POA: Diagnosis not present

## 2019-01-20 DIAGNOSIS — I82409 Acute embolism and thrombosis of unspecified deep veins of unspecified lower extremity: Secondary | ICD-10-CM

## 2019-01-20 DIAGNOSIS — M21372 Foot drop, left foot: Secondary | ICD-10-CM | POA: Diagnosis not present

## 2019-01-20 LAB — POCT INR: INR: 1.9 — AB (ref 2.0–3.0)

## 2019-01-20 NOTE — Patient Instructions (Signed)
Restart warfarin 5mg  daily except 2.5mg  on Mondays, Wednesdays and Fridays Recheck in 1 week Orders given to [place] [facility] Orders faxed to [facility] [phone]

## 2019-01-21 DIAGNOSIS — M48061 Spinal stenosis, lumbar region without neurogenic claudication: Secondary | ICD-10-CM | POA: Diagnosis not present

## 2019-01-21 DIAGNOSIS — I69354 Hemiplegia and hemiparesis following cerebral infarction affecting left non-dominant side: Secondary | ICD-10-CM | POA: Diagnosis not present

## 2019-01-21 DIAGNOSIS — U071 COVID-19: Secondary | ICD-10-CM | POA: Diagnosis not present

## 2019-01-21 DIAGNOSIS — R5383 Other fatigue: Secondary | ICD-10-CM | POA: Diagnosis not present

## 2019-01-21 DIAGNOSIS — M4316 Spondylolisthesis, lumbar region: Secondary | ICD-10-CM | POA: Diagnosis not present

## 2019-01-21 DIAGNOSIS — M21372 Foot drop, left foot: Secondary | ICD-10-CM | POA: Diagnosis not present

## 2019-01-23 ENCOUNTER — Other Ambulatory Visit: Payer: Self-pay | Admitting: "Endocrinology

## 2019-01-26 DIAGNOSIS — M48061 Spinal stenosis, lumbar region without neurogenic claudication: Secondary | ICD-10-CM | POA: Diagnosis not present

## 2019-01-26 DIAGNOSIS — U071 COVID-19: Secondary | ICD-10-CM | POA: Diagnosis not present

## 2019-01-26 DIAGNOSIS — M21372 Foot drop, left foot: Secondary | ICD-10-CM | POA: Diagnosis not present

## 2019-01-26 DIAGNOSIS — R5383 Other fatigue: Secondary | ICD-10-CM | POA: Diagnosis not present

## 2019-01-26 DIAGNOSIS — I69354 Hemiplegia and hemiparesis following cerebral infarction affecting left non-dominant side: Secondary | ICD-10-CM | POA: Diagnosis not present

## 2019-01-26 DIAGNOSIS — M4316 Spondylolisthesis, lumbar region: Secondary | ICD-10-CM | POA: Diagnosis not present

## 2019-01-27 ENCOUNTER — Ambulatory Visit (INDEPENDENT_AMBULATORY_CARE_PROVIDER_SITE_OTHER): Payer: Medicare Other | Admitting: *Deleted

## 2019-01-27 DIAGNOSIS — Z5181 Encounter for therapeutic drug level monitoring: Secondary | ICD-10-CM | POA: Diagnosis not present

## 2019-01-27 DIAGNOSIS — M4316 Spondylolisthesis, lumbar region: Secondary | ICD-10-CM | POA: Diagnosis not present

## 2019-01-27 DIAGNOSIS — I82409 Acute embolism and thrombosis of unspecified deep veins of unspecified lower extremity: Secondary | ICD-10-CM | POA: Diagnosis not present

## 2019-01-27 DIAGNOSIS — U071 COVID-19: Secondary | ICD-10-CM | POA: Diagnosis not present

## 2019-01-27 DIAGNOSIS — M48061 Spinal stenosis, lumbar region without neurogenic claudication: Secondary | ICD-10-CM | POA: Diagnosis not present

## 2019-01-27 DIAGNOSIS — M21372 Foot drop, left foot: Secondary | ICD-10-CM | POA: Diagnosis not present

## 2019-01-27 DIAGNOSIS — R5383 Other fatigue: Secondary | ICD-10-CM | POA: Diagnosis not present

## 2019-01-27 DIAGNOSIS — I69354 Hemiplegia and hemiparesis following cerebral infarction affecting left non-dominant side: Secondary | ICD-10-CM | POA: Diagnosis not present

## 2019-01-27 LAB — POCT INR: INR: 2.6 (ref 2.0–3.0)

## 2019-01-27 NOTE — Patient Instructions (Signed)
Continue warfarin 5mg  daily except 2.5mg  on Mondays, Wednesdays and Fridays Recheck in 1 week Orders given to [place] [facility] Orders faxed to [facility] [phone]

## 2019-01-28 DIAGNOSIS — D649 Anemia, unspecified: Secondary | ICD-10-CM | POA: Diagnosis not present

## 2019-01-30 DIAGNOSIS — M21372 Foot drop, left foot: Secondary | ICD-10-CM | POA: Diagnosis not present

## 2019-01-30 DIAGNOSIS — I69354 Hemiplegia and hemiparesis following cerebral infarction affecting left non-dominant side: Secondary | ICD-10-CM | POA: Diagnosis not present

## 2019-01-30 DIAGNOSIS — M48061 Spinal stenosis, lumbar region without neurogenic claudication: Secondary | ICD-10-CM | POA: Diagnosis not present

## 2019-01-30 DIAGNOSIS — R5383 Other fatigue: Secondary | ICD-10-CM | POA: Diagnosis not present

## 2019-01-30 DIAGNOSIS — M4316 Spondylolisthesis, lumbar region: Secondary | ICD-10-CM | POA: Diagnosis not present

## 2019-01-30 DIAGNOSIS — U071 COVID-19: Secondary | ICD-10-CM | POA: Diagnosis not present

## 2019-01-31 DIAGNOSIS — R5383 Other fatigue: Secondary | ICD-10-CM | POA: Diagnosis not present

## 2019-01-31 DIAGNOSIS — M4316 Spondylolisthesis, lumbar region: Secondary | ICD-10-CM | POA: Diagnosis not present

## 2019-01-31 DIAGNOSIS — U071 COVID-19: Secondary | ICD-10-CM | POA: Diagnosis not present

## 2019-01-31 DIAGNOSIS — M21372 Foot drop, left foot: Secondary | ICD-10-CM | POA: Diagnosis not present

## 2019-01-31 DIAGNOSIS — I69354 Hemiplegia and hemiparesis following cerebral infarction affecting left non-dominant side: Secondary | ICD-10-CM | POA: Diagnosis not present

## 2019-01-31 DIAGNOSIS — M48061 Spinal stenosis, lumbar region without neurogenic claudication: Secondary | ICD-10-CM | POA: Diagnosis not present

## 2019-02-04 ENCOUNTER — Ambulatory Visit (INDEPENDENT_AMBULATORY_CARE_PROVIDER_SITE_OTHER): Payer: Medicare Other | Admitting: *Deleted

## 2019-02-04 DIAGNOSIS — I82409 Acute embolism and thrombosis of unspecified deep veins of unspecified lower extremity: Secondary | ICD-10-CM | POA: Diagnosis not present

## 2019-02-04 DIAGNOSIS — Z5181 Encounter for therapeutic drug level monitoring: Secondary | ICD-10-CM | POA: Diagnosis not present

## 2019-02-04 DIAGNOSIS — M21372 Foot drop, left foot: Secondary | ICD-10-CM | POA: Diagnosis not present

## 2019-02-04 DIAGNOSIS — U071 COVID-19: Secondary | ICD-10-CM | POA: Diagnosis not present

## 2019-02-04 DIAGNOSIS — I69354 Hemiplegia and hemiparesis following cerebral infarction affecting left non-dominant side: Secondary | ICD-10-CM | POA: Diagnosis not present

## 2019-02-04 DIAGNOSIS — M4316 Spondylolisthesis, lumbar region: Secondary | ICD-10-CM | POA: Diagnosis not present

## 2019-02-04 DIAGNOSIS — R5383 Other fatigue: Secondary | ICD-10-CM | POA: Diagnosis not present

## 2019-02-04 DIAGNOSIS — M48061 Spinal stenosis, lumbar region without neurogenic claudication: Secondary | ICD-10-CM | POA: Diagnosis not present

## 2019-02-04 LAB — POCT INR: INR: 2.5 (ref 2.0–3.0)

## 2019-02-04 NOTE — Patient Instructions (Signed)
Continue warfarin 5mg  daily except 2.5mg  on Mondays, Wednesdays and Fridays Recheck in 3 weeks Orders given to [place] [facility] Orders faxed to [facility] [phone]

## 2019-02-05 DIAGNOSIS — M21372 Foot drop, left foot: Secondary | ICD-10-CM | POA: Diagnosis not present

## 2019-02-05 DIAGNOSIS — M4316 Spondylolisthesis, lumbar region: Secondary | ICD-10-CM | POA: Diagnosis not present

## 2019-02-05 DIAGNOSIS — M48061 Spinal stenosis, lumbar region without neurogenic claudication: Secondary | ICD-10-CM | POA: Diagnosis not present

## 2019-02-05 DIAGNOSIS — U071 COVID-19: Secondary | ICD-10-CM | POA: Diagnosis not present

## 2019-02-05 DIAGNOSIS — I69354 Hemiplegia and hemiparesis following cerebral infarction affecting left non-dominant side: Secondary | ICD-10-CM | POA: Diagnosis not present

## 2019-02-05 DIAGNOSIS — R5383 Other fatigue: Secondary | ICD-10-CM | POA: Diagnosis not present

## 2019-02-06 ENCOUNTER — Ambulatory Visit (HOSPITAL_COMMUNITY): Payer: Medicare Other | Admitting: Psychiatry

## 2019-02-06 DIAGNOSIS — M21372 Foot drop, left foot: Secondary | ICD-10-CM | POA: Diagnosis not present

## 2019-02-06 DIAGNOSIS — U071 COVID-19: Secondary | ICD-10-CM | POA: Diagnosis not present

## 2019-02-06 DIAGNOSIS — M4316 Spondylolisthesis, lumbar region: Secondary | ICD-10-CM | POA: Diagnosis not present

## 2019-02-06 DIAGNOSIS — I69354 Hemiplegia and hemiparesis following cerebral infarction affecting left non-dominant side: Secondary | ICD-10-CM | POA: Diagnosis not present

## 2019-02-06 DIAGNOSIS — M48061 Spinal stenosis, lumbar region without neurogenic claudication: Secondary | ICD-10-CM | POA: Diagnosis not present

## 2019-02-06 DIAGNOSIS — R5383 Other fatigue: Secondary | ICD-10-CM | POA: Diagnosis not present

## 2019-02-09 DIAGNOSIS — E1142 Type 2 diabetes mellitus with diabetic polyneuropathy: Secondary | ICD-10-CM | POA: Diagnosis not present

## 2019-02-09 DIAGNOSIS — K219 Gastro-esophageal reflux disease without esophagitis: Secondary | ICD-10-CM | POA: Diagnosis not present

## 2019-02-10 ENCOUNTER — Ambulatory Visit: Payer: Medicare Other | Admitting: Nurse Practitioner

## 2019-02-10 DIAGNOSIS — U071 COVID-19: Secondary | ICD-10-CM | POA: Diagnosis not present

## 2019-02-10 DIAGNOSIS — R5383 Other fatigue: Secondary | ICD-10-CM | POA: Diagnosis not present

## 2019-02-10 DIAGNOSIS — M545 Low back pain: Secondary | ICD-10-CM | POA: Diagnosis not present

## 2019-02-10 DIAGNOSIS — M21372 Foot drop, left foot: Secondary | ICD-10-CM | POA: Diagnosis not present

## 2019-02-10 DIAGNOSIS — M48061 Spinal stenosis, lumbar region without neurogenic claudication: Secondary | ICD-10-CM | POA: Diagnosis not present

## 2019-02-10 DIAGNOSIS — M961 Postlaminectomy syndrome, not elsewhere classified: Secondary | ICD-10-CM | POA: Diagnosis not present

## 2019-02-10 DIAGNOSIS — I69354 Hemiplegia and hemiparesis following cerebral infarction affecting left non-dominant side: Secondary | ICD-10-CM | POA: Diagnosis not present

## 2019-02-10 DIAGNOSIS — M4316 Spondylolisthesis, lumbar region: Secondary | ICD-10-CM | POA: Diagnosis not present

## 2019-02-10 DIAGNOSIS — I671 Cerebral aneurysm, nonruptured: Secondary | ICD-10-CM | POA: Diagnosis not present

## 2019-02-10 NOTE — Progress Notes (Deleted)
Referring Provider: [name], MD Primary Care Physician:  [name], MD Primary GI:  Dr.   Rayne Du chief complaint on file.   HPI:   [name] is a 74 y.o. female who presents for follow-up on constipation and diarrhea.  The patient was last seen in our office 20 for abdominal pain, constipation, rectal bleeding.  Noted history of IBS and IDA with previous work-up with colonoscopy/EGD/capsule study in 2016 and felt IDA related to erosive gastropathy and enteropathy with recommended repeat colonoscopy in 2019.  Also history of liver hemangioma on imaging.  EGD updated 2018 with gastritis negative for H. pylori.  Lives in a nursing home.  IBS mixed type with MiraLAX and Imodium as needed.  At her last visit she was accompanied by nursing home staff member.  Abdominal pain for the past week which is achy, intermittent, lasts about a day and occurs after a bowel movement 3 times a week and always associated with a bowel movement.  Worsening constipation with straining and hard stools although they are sometimes soft.  Hematochezia with straining minimal to medium amount.  Rectal discomfort but no itching.  Has not used any hemorrhoid cream.  No other overt GI complaints.  Current bowel regimen Colace daily, Amitiza 24 mcg daily, MiraLAX as needed.  Recommended Anusol rectal cream, continue current medications, use MiraLAX 1-2 times a day if no bowel movement in 2 days until a good stool is had.  Antidiarrheal as needed.  Follow-up in 3 months.  The patient is currently overdue for her repeat colonoscopy.  Today she states   Past Medical History:  Diagnosis Date  . Anxiety   . Arthritis   . Dependence on wheelchair    pivot/transfers  . Depression    History of psychosis and previous suicide attempt  . DVT, lower extremity, recurrent (HCC)    Long-term Coumadin per Dr. Legrand Rams  . Essential hypertension   . GERD (gastroesophageal reflux disease)   . Hemiplegia ([place]) 2010   Left  side  . History of stroke    Acute infarct and right cerebral white matter small vessel disease 12/10  . Leg DVT (deep venous thromboembolism), acute ([place]) 2006  . Schizophrenia ([place])   . Stroke [facility])    left sided weakness  . Type 2 diabetes mellitus ([place])     Past Surgical History:  Procedure Laterality Date  . BACK SURGERY    . BIOPSY N/A 11/24/2014   Procedure: BIOPSY;  Surgeon: Danie Binder, MD;  Location: [facility];  Service: Endoscopy;  Laterality: N/A;  . BIOPSY  05/17/2016   Procedure: BIOPSY;  Surgeon: Danie Binder, MD;  [facility];  Service: Endoscopy;;  gastric biopsy  . COLONOSCOPY WITH PROPOFOL N/A 11/24/2014   Dr. Rudie Meyer polyps removed/moderate sized internal hemorrhoids, tubular adenomas. Next surveillance in 3 years  . ESOPHAGOGASTRODUODENOSCOPY (EGD) WITH PROPOFOL N/A 11/24/2014   Dr. Clayburn Pert HH/patent stricture at the gastroesophageal junction, mild non-erosive gastritis, path negative for H.pylori or celiac sprue  . ESOPHAGOGASTRODUODENOSCOPY (EGD) WITH PROPOFOL N/A 05/17/2016   Procedure: ESOPHAGOGASTRODUODENOSCOPY (EGD) WITH PROPOFOL;  Surgeon: Danie Binder, MD;  Location[facility];  Service: Endoscopy;  Laterality: N/A;  12:45pm  . GIVENS CAPSULE STUDY N/A 12/11/2014   MULTILPLE EROSION IN the stomach WITH ACTIVE OOZING. OCCASIONAL EROSIONS AND RARE ULCER SEEN IN PROXIMAL SMALL BOWEL . No masses or AVMs SEEN. NO OLD BLOOD OR FRESH BLOOD SEEN.   . POLYPECTOMY N/A 11/24/2014  Procedure: POLYPECTOMY;  Surgeon: Danie Binder, MD;  Location: [facility];  Service: Endoscopy;  Laterality: N/A;  . SAVORY DILATION N/A 05/17/2016   Procedure: SAVORY DILATION;  Surgeon: Danie Binder, MD;  [facility];  Service: Endoscopy;  Laterality: N/A;    Current Outpatient Medications  Medication Sig Dispense Refill  . ABILIFY 2 MG tablet Take 1 tablet (2 mg total) by mouth daily. 28 tablet 3  . acetaminophen (TYLENOL) 325 MG tablet Take 650 mg by  mouth 3 (three) times daily as needed.    . calcium carbonate (TUMS - DOSED IN MG ELEMENTAL CALCIUM) 500 MG chewable tablet Chew 1 tablet by mouth 4 (four) times daily as needed for indigestion or heartburn.     . cholecalciferol (VITAMIN D) 1000 units tablet Take 2,000 Units by mouth daily.    . clonazePAM (KLONOPIN) 0.5 MG tablet Take 1 tablet (0.5 mg total) by mouth 3 (three) times daily. (Patient taking differently: Take 0.5 mg by mouth 3 (three) times daily as needed. ) 90 tablet 2  . docusate sodium (COLACE) 100 MG capsule Take 200 mg by mouth at bedtime.     . DULoxetine (CYMBALTA) 60 MG capsule Take 120 mg by mouth daily.     Marland Kitchen esomeprazole (NEXIUM) 40 MG capsule Take 40 mg by mouth daily at 12 noon.    . gabapentin (NEURONTIN) 300 MG capsule Take 300 mg by mouth 3 (three) times daily.    Marland Kitchen HYDROcodone-acetaminophen (NORCO) 10-325 MG tablet Take 1 tablet by mouth every 8 (eight) hours as needed.     . hydrocortisone (ANUSOL-HC) 2.5 % rectal cream Place 1 application rectally 2 (two) times daily as needed.     . hydrocortisone (ANUSOL-HC) 2.5 % rectal cream Place 1 application rectally 2 (two) times daily as needed (rectal discomfort of bleeding; up to 10 days at a time). 30 g 1  . iron polysaccharides (NIFEREX) 150 MG capsule Take 1 capsule (150 mg total) by mouth daily. 30 capsule 11  . latanoprost (XALATAN) 0.005 % ophthalmic solution Place 1 drop into both eyes at bedtime.    Marland Kitchen LEVEMIR FLEXTOUCH 100 UNIT/ML Pen INJECT 28 UNITS INTO THE SKIN AT BEDTIME. 15 mL 3  . loratadine (CLARITIN) 10 MG tablet Take 10 mg by mouth daily.    Marland Kitchen lubiprostone (AMITIZA) 24 MCG capsule Take 1 capsule (24 mcg total) by mouth 2 (two) times daily with a meal. 180 capsule 3  . metoprolol succinate (TOPROL-XL) 25 MG 24 hr tablet Take 0.5 tablets (12.5 mg total) by mouth daily. (Patient taking differently: Take 12.5 mg by mouth 2 (two) times daily. )    . midodrine (PROAMATINE) 2.5 MG tablet Take 5 mg by mouth 3  (three) times daily with meals.     . mirabegron ER (MYRBETRIQ) 25 MG TB24 tablet Take 25 mg by mouth daily.    . ondansetron (ZOFRAN) 4 MG tablet Take 1 tablet (4 mg total) by mouth 4 (four) times daily -  before meals and at bedtime. (Patient taking differently: Take 4 mg by mouth as needed. ) 120 tablet 3  . pantoprazole (PROTONIX) 40 MG tablet Take 40 mg by mouth daily.    . polyethylene glycol powder (GLYCOLAX/MIRALAX) 17 GM/SCOOP powder Take 17 g by mouth 2 (two) times daily as needed (straining or no BM in 2 days). 255 g 0  . risperiDONE (RISPERDAL) 1 MG tablet Take 1 tablet (1 mg total) by mouth at bedtime. 28 tablet 3  .  simvastatin (ZOCOR) 20 MG tablet Take 20 mg by mouth at bedtime.     Marland Kitchen tiZANidine (ZANAFLEX) 2 MG tablet Take 2 mg by mouth daily.    Marland Kitchen warfarin (COUMADIN) 5 MG tablet Take 1 tablet tonight then increase dose to 1 tablet daily except 1/2 tablet on Mondays, Wednesdays and Fridays 26 tablet 6  . zolpidem (AMBIEN) 10 MG tablet Take 1 tablet (10 mg total) by mouth at bedtime. Please hold this medication if she has drowsiness 30 tablet 3   No current facility-administered medications for this visit.    Allergies as of 02/10/2019 - Review Complete 11/26/2018  Allergen Reaction Noted  . Sulfa antibiotics Rash 02/23/2010  . Sulfonamide derivatives Rash     Family History  Problem Relation Age of Onset  . Hypertension Mother   . Colon cancer Neg Hx     Social History   Socioeconomic History  . Marital status: Widowed    Spouse name: Not on file  . Number of children: Not on file  . Years of education: Not on file  . Highest education level: Not on file  Occupational History  . Not on file  Tobacco Use  . Smoking status: Former Smoker    Packs/day: 0.25    Years: 20.00    Pack years: 5.00    Types: Cigarettes    Quit date: 01/24/1995    Years since quitting: 24.0  . Smokeless tobacco: Never Used  Substance and Sexual Activity  . Alcohol use: No     Alcohol/week: 0.0 standard drinks  . Drug use: No  . Sexual activity: Never    Birth control/protection: Post-menopausal  Other Topics Concern  . Not on file  Social History Narrative  . Not on file   Social Determinants of Health   Financial Resource Strain:   . Difficulty of Paying Living Expenses: Not on file  Food Insecurity:   . Worried About Charity fundraiser in the Last Year: Not on file  . Ran Out of Food in the Last Year: Not on file  Transportation Needs:   . Lack of Transportation (Medical): Not on file  . Lack of Transportation (Non-Medical): Not on file  Physical Activity:   . Days of Exercise per Week: Not on file  . Minutes of Exercise per Session: Not on file  Stress:   . Feeling of Stress : Not on file  Social Connections:   . Frequency of Communication with Friends and Family: Not on file  . Frequency of Social Gatherings with Friends and Family: Not on file  . Attends Religious Services: Not on file  . Active Member of Clubs or Organizations: Not on file  . Attends Archivist Meetings: Not on file  . Marital Status: Not on file    Review of Systems: General: Negative for anorexia, weight loss, fever, chills, fatigue, weakness. Eyes: Negative for vision changes.  ENT: Negative for hoarseness, difficulty swallowing , nasal congestion. CV: Negative for chest pain, angina, palpitations, dyspnea on exertion, peripheral edema.  Respiratory: Negative for dyspnea at rest, dyspnea on exertion, cough, sputum, wheezing.  GI: See history of present illness. GU:  Negative for dysuria, hematuria, urinary incontinence, urinary frequency, nocturnal urination.  MS: Negative for joint pain, low back pain.  Derm: Negative for rash or itching.  Neuro: Negative for weakness, abnormal sensation, seizure, frequent headaches, memory loss, confusion.  Psych: Negative for anxiety, depression, suicidal ideation, hallucinations.  Endo: Negative for unusual weight  change.  Heme: Negative for bruising or bleeding. Allergy: Negative for rash or hives.   Physical Exam: There were no vitals taken for this visit. General:   Alert and oriented. Pleasant and cooperative. Well-nourished and well-developed.  Head:  Normocephalic and atraumatic. Eyes:  Without icterus, sclera clear and conjunctiva pink.  Ears:  Normal auditory acuity. Mouth:  No deformity or lesions, oral mucosa pink.  Throat/Neck:  Supple, without mass or thyromegaly. Cardiovascular:  S1, S2 present without murmurs appreciated. Normal pulses noted. Extremities without clubbing or edema. Respiratory:  Clear to auscultation bilaterally. No wheezes, rales, or rhonchi. No distress.  Gastrointestinal:  +BS, soft, non-tender and non-distended. No HSM noted. No guarding or rebound. No masses appreciated.  Rectal:  Deferred  Musculoskalatal:  Symmetrical without gross deformities. Normal posture. Skin:  Intact without significant lesions or rashes. Neurologic:  Alert and oriented x4;  grossly normal neurologically. Psych:  Alert and cooperative. Normal mood and affect. Heme/Lymph/Immune: No significant cervical adenopathy. No excessive bruising noted.    02/10/2019 8:11 AM   Disclaimer: This note was dictated with voice recognition software. Similar sounding words can inadvertently be transcribed and may not be corrected upon review.

## 2019-02-11 DIAGNOSIS — U071 COVID-19: Secondary | ICD-10-CM | POA: Diagnosis not present

## 2019-02-11 DIAGNOSIS — I69354 Hemiplegia and hemiparesis following cerebral infarction affecting left non-dominant side: Secondary | ICD-10-CM | POA: Diagnosis not present

## 2019-02-11 DIAGNOSIS — M4316 Spondylolisthesis, lumbar region: Secondary | ICD-10-CM | POA: Diagnosis not present

## 2019-02-11 DIAGNOSIS — M48061 Spinal stenosis, lumbar region without neurogenic claudication: Secondary | ICD-10-CM | POA: Diagnosis not present

## 2019-02-11 DIAGNOSIS — M21372 Foot drop, left foot: Secondary | ICD-10-CM | POA: Diagnosis not present

## 2019-02-11 DIAGNOSIS — R5383 Other fatigue: Secondary | ICD-10-CM | POA: Diagnosis not present

## 2019-02-12 DIAGNOSIS — U071 COVID-19: Secondary | ICD-10-CM | POA: Diagnosis not present

## 2019-02-12 DIAGNOSIS — M48061 Spinal stenosis, lumbar region without neurogenic claudication: Secondary | ICD-10-CM | POA: Diagnosis not present

## 2019-02-12 DIAGNOSIS — E119 Type 2 diabetes mellitus without complications: Secondary | ICD-10-CM | POA: Diagnosis not present

## 2019-02-12 DIAGNOSIS — M21372 Foot drop, left foot: Secondary | ICD-10-CM | POA: Diagnosis not present

## 2019-02-12 DIAGNOSIS — M4316 Spondylolisthesis, lumbar region: Secondary | ICD-10-CM | POA: Diagnosis not present

## 2019-02-12 DIAGNOSIS — R5383 Other fatigue: Secondary | ICD-10-CM | POA: Diagnosis not present

## 2019-02-12 DIAGNOSIS — Z7984 Long term (current) use of oral hypoglycemic drugs: Secondary | ICD-10-CM | POA: Diagnosis not present

## 2019-02-12 DIAGNOSIS — I69354 Hemiplegia and hemiparesis following cerebral infarction affecting left non-dominant side: Secondary | ICD-10-CM | POA: Diagnosis not present

## 2019-02-12 DIAGNOSIS — Z794 Long term (current) use of insulin: Secondary | ICD-10-CM | POA: Diagnosis not present

## 2019-02-12 DIAGNOSIS — H25813 Combined forms of age-related cataract, bilateral: Secondary | ICD-10-CM | POA: Diagnosis not present

## 2019-02-13 DIAGNOSIS — M21372 Foot drop, left foot: Secondary | ICD-10-CM | POA: Diagnosis not present

## 2019-02-13 DIAGNOSIS — R5383 Other fatigue: Secondary | ICD-10-CM | POA: Diagnosis not present

## 2019-02-13 DIAGNOSIS — M48061 Spinal stenosis, lumbar region without neurogenic claudication: Secondary | ICD-10-CM | POA: Diagnosis not present

## 2019-02-13 DIAGNOSIS — U071 COVID-19: Secondary | ICD-10-CM | POA: Diagnosis not present

## 2019-02-13 DIAGNOSIS — I69354 Hemiplegia and hemiparesis following cerebral infarction affecting left non-dominant side: Secondary | ICD-10-CM | POA: Diagnosis not present

## 2019-02-13 DIAGNOSIS — M4316 Spondylolisthesis, lumbar region: Secondary | ICD-10-CM | POA: Diagnosis not present

## 2019-02-16 DIAGNOSIS — M48061 Spinal stenosis, lumbar region without neurogenic claudication: Secondary | ICD-10-CM | POA: Diagnosis not present

## 2019-02-16 DIAGNOSIS — I69354 Hemiplegia and hemiparesis following cerebral infarction affecting left non-dominant side: Secondary | ICD-10-CM | POA: Diagnosis not present

## 2019-02-16 DIAGNOSIS — R32 Unspecified urinary incontinence: Secondary | ICD-10-CM | POA: Diagnosis not present

## 2019-02-16 DIAGNOSIS — U071 COVID-19: Secondary | ICD-10-CM | POA: Diagnosis not present

## 2019-02-16 DIAGNOSIS — M4316 Spondylolisthesis, lumbar region: Secondary | ICD-10-CM | POA: Diagnosis not present

## 2019-02-16 DIAGNOSIS — Z794 Long term (current) use of insulin: Secondary | ICD-10-CM | POA: Diagnosis not present

## 2019-02-16 DIAGNOSIS — E1151 Type 2 diabetes mellitus with diabetic peripheral angiopathy without gangrene: Secondary | ICD-10-CM | POA: Diagnosis not present

## 2019-02-16 DIAGNOSIS — F209 Schizophrenia, unspecified: Secondary | ICD-10-CM | POA: Diagnosis not present

## 2019-02-16 DIAGNOSIS — Z5181 Encounter for therapeutic drug level monitoring: Secondary | ICD-10-CM | POA: Diagnosis not present

## 2019-02-16 DIAGNOSIS — Z7901 Long term (current) use of anticoagulants: Secondary | ICD-10-CM | POA: Diagnosis not present

## 2019-02-16 DIAGNOSIS — R5383 Other fatigue: Secondary | ICD-10-CM | POA: Diagnosis not present

## 2019-02-16 DIAGNOSIS — M21372 Foot drop, left foot: Secondary | ICD-10-CM | POA: Diagnosis not present

## 2019-02-17 DIAGNOSIS — R5383 Other fatigue: Secondary | ICD-10-CM | POA: Diagnosis not present

## 2019-02-17 DIAGNOSIS — M21372 Foot drop, left foot: Secondary | ICD-10-CM | POA: Diagnosis not present

## 2019-02-17 DIAGNOSIS — M48061 Spinal stenosis, lumbar region without neurogenic claudication: Secondary | ICD-10-CM | POA: Diagnosis not present

## 2019-02-17 DIAGNOSIS — I69354 Hemiplegia and hemiparesis following cerebral infarction affecting left non-dominant side: Secondary | ICD-10-CM | POA: Diagnosis not present

## 2019-02-17 DIAGNOSIS — U071 COVID-19: Secondary | ICD-10-CM | POA: Diagnosis not present

## 2019-02-17 DIAGNOSIS — M4316 Spondylolisthesis, lumbar region: Secondary | ICD-10-CM | POA: Diagnosis not present

## 2019-02-18 DIAGNOSIS — M21372 Foot drop, left foot: Secondary | ICD-10-CM | POA: Diagnosis not present

## 2019-02-18 DIAGNOSIS — M48061 Spinal stenosis, lumbar region without neurogenic claudication: Secondary | ICD-10-CM | POA: Diagnosis not present

## 2019-02-18 DIAGNOSIS — U071 COVID-19: Secondary | ICD-10-CM | POA: Diagnosis not present

## 2019-02-18 DIAGNOSIS — M4316 Spondylolisthesis, lumbar region: Secondary | ICD-10-CM | POA: Diagnosis not present

## 2019-02-18 DIAGNOSIS — I69354 Hemiplegia and hemiparesis following cerebral infarction affecting left non-dominant side: Secondary | ICD-10-CM | POA: Diagnosis not present

## 2019-02-18 DIAGNOSIS — R5383 Other fatigue: Secondary | ICD-10-CM | POA: Diagnosis not present

## 2019-02-19 DIAGNOSIS — R5383 Other fatigue: Secondary | ICD-10-CM | POA: Diagnosis not present

## 2019-02-19 DIAGNOSIS — M4316 Spondylolisthesis, lumbar region: Secondary | ICD-10-CM | POA: Diagnosis not present

## 2019-02-19 DIAGNOSIS — M21372 Foot drop, left foot: Secondary | ICD-10-CM | POA: Diagnosis not present

## 2019-02-19 DIAGNOSIS — I69354 Hemiplegia and hemiparesis following cerebral infarction affecting left non-dominant side: Secondary | ICD-10-CM | POA: Diagnosis not present

## 2019-02-19 DIAGNOSIS — U071 COVID-19: Secondary | ICD-10-CM | POA: Diagnosis not present

## 2019-02-19 DIAGNOSIS — M48061 Spinal stenosis, lumbar region without neurogenic claudication: Secondary | ICD-10-CM | POA: Diagnosis not present

## 2019-02-20 DIAGNOSIS — M4316 Spondylolisthesis, lumbar region: Secondary | ICD-10-CM | POA: Diagnosis not present

## 2019-02-20 DIAGNOSIS — R5383 Other fatigue: Secondary | ICD-10-CM | POA: Diagnosis not present

## 2019-02-20 DIAGNOSIS — M21372 Foot drop, left foot: Secondary | ICD-10-CM | POA: Diagnosis not present

## 2019-02-20 DIAGNOSIS — M48061 Spinal stenosis, lumbar region without neurogenic claudication: Secondary | ICD-10-CM | POA: Diagnosis not present

## 2019-02-20 DIAGNOSIS — I69354 Hemiplegia and hemiparesis following cerebral infarction affecting left non-dominant side: Secondary | ICD-10-CM | POA: Diagnosis not present

## 2019-02-20 DIAGNOSIS — U071 COVID-19: Secondary | ICD-10-CM | POA: Diagnosis not present

## 2019-02-21 ENCOUNTER — Ambulatory Visit (INDEPENDENT_AMBULATORY_CARE_PROVIDER_SITE_OTHER): Payer: Medicare Other | Admitting: Psychiatry

## 2019-02-21 ENCOUNTER — Other Ambulatory Visit: Payer: Self-pay

## 2019-02-21 ENCOUNTER — Encounter: Payer: Self-pay | Admitting: Psychiatry

## 2019-02-21 DIAGNOSIS — F209 Schizophrenia, unspecified: Secondary | ICD-10-CM

## 2019-02-21 MED ORDER — ZOLPIDEM TARTRATE 10 MG PO TABS: 10.0000 mg | ORAL_TABLET | Freq: Every day | ORAL | 2 refills | Status: DC

## 2019-02-21 MED ORDER — RISPERIDONE 1 MG PO TABS: 1.0000 mg | ORAL_TABLET | Freq: Every day | ORAL | 2 refills | Status: DC

## 2019-02-21 MED ORDER — ABILIFY 2 MG PO TABS: 2.0000 mg | ORAL_TABLET | Freq: Every day | ORAL | 2 refills | Status: DC

## 2019-02-21 NOTE — Progress Notes (Signed)
[facility] MD OP Progress Note  Virtual Visit via Telephone Note  I connected with [name] on 02/21/19 at  9:30 AM EST by telephone and verified that I am speaking with the correct person using two identifiers.    I discussed the limitations, risks, security and privacy concerns of performing an evaluation and management service by telephone and the availability of in person appointments. I also discussed with the patient that there may be a patient responsible charge related to this service. The patient expressed understanding and agreed to proceed.   02/21/2019 9:13 AM [name]  MRN:  [id number]  Chief Complaint:  " I am doing good."  HPI: [name] is a 74 y.o. year old female with a history of schizophrenia, depression,SVT, DVT, history of stroke on warfarin, type II diabet aneurysm contact via phone for f/up.  Pt resides in a long term residential facility. The staff informed that the pt has been doing well. She is compliant with her medications.  Pt stated that she is doing good. Initially she stated that she has not been sleeping well. When further questions were asked she stated that she sleeps fine. Upon clarification, pt thought she was being asked regarding her back and complained that her back hurts a lot. She denied any hallucinations or paranoid delusions. Her family members stay in regular touch with her. She gets daily phone calls from a few members through out the day.   Diagnosis:    ICD-10-CM   1. Schizophrenia, unspecified type ([place])  F20.9     Past Psychiatric History: Schizophrenia  Past Medical History:  Past Medical History:  Diagnosis Date  . Anxiety   . Arthritis   . Dependence on wheelchair    pivot/transfers  . Depression    History of psychosis and previous suicide attempt  . DVT, lower extremity, recurrent (HCC)    Long-term Coumadin per Dr. Legrand Rams  . Essential hypertension   . GERD (gastroesophageal reflux disease)   . Hemiplegia  ([place]) 2010   Left side  . History of stroke    Acute infarct and right cerebral white matter small vessel disease 12/10  . Leg DVT (deep venous thromboembolism), acute ([place]) 2006  . Schizophrenia ([place])   . Stroke [facility])    left sided weakness  . Type 2 diabetes mellitus ([place])     Past Surgical History:  Procedure Laterality Date  . BACK SURGERY    . BIOPSY N/A 11/24/2014   Procedure: BIOPSY;  Surgeon: Danie Binder, MD;  Location: [facility];  Service: Endoscopy;  Laterality: N/A;  . BIOPSY  05/17/2016   Procedure: BIOPSY;  Surgeon: Danie Binder, MD;  [facility];  Service: Endoscopy;;  gastric biopsy  . COLONOSCOPY WITH PROPOFOL N/A 11/24/2014   Dr. Rudie Meyer polyps removed/moderate sized internal hemorrhoids, tubular adenomas. Next surveillance in 3 years  . ESOPHAGOGASTRODUODENOSCOPY (EGD) WITH PROPOFOL N/A 11/24/2014   Dr. Clayburn Pert HH/patent stricture at the gastroesophageal junction, mild non-erosive gastritis, path negative for H.pylori or celiac sprue  . ESOPHAGOGASTRODUODENOSCOPY (EGD) WITH PROPOFOL N/A 05/17/2016   Procedure: ESOPHAGOGASTRODUODENOSCOPY (EGD) WITH PROPOFOL;  Surgeon: Danie Binder, MD;  [facility];  Service: Endoscopy;  Laterality: N/A;  12:45pm  . GIVENS CAPSULE STUDY N/A 12/11/2014   MULTILPLE EROSION IN the stomach WITH ACTIVE OOZING. OCCASIONAL EROSIONS AND RARE ULCER SEEN IN PROXIMAL SMALL BOWEL . No masses or AVMs SEEN. NO OLD BLOOD OR FRESH BLOOD SEEN.   . POLYPECTOMY  N/A 11/24/2014   Procedure: POLYPECTOMY;  Surgeon: Danie Binder, MD;  Location: [facility];  Service: Endoscopy;  Laterality: N/A;  . SAVORY DILATION N/A 05/17/2016   Procedure: SAVORY DILATION;  Surgeon: Danie Binder, MD;  [facility];  Service: Endoscopy;  Laterality: N/A;    Family Psychiatric History: denied Family History:  Family History  Problem Relation Age of Onset  . Hypertension Mother   . Colon cancer Neg Hx     Social History:  Social  History   Socioeconomic History  . Marital status: Widowed    Spouse name: Not on file  . Number of children: Not on file  . Years of education: Not on file  . Highest education level: Not on file  Occupational History  . Not on file  Tobacco Use  . Smoking status: Former Smoker    Packs/day: 0.25    Years: 20.00    Pack years: 5.00    Types: Cigarettes    Quit date: 01/24/1995    Years since quitting: 24.0  . Smokeless tobacco: Never Used  Substance and Sexual Activity  . Alcohol use: No    Alcohol/week: 0.0 standard drinks  . Drug use: No  . Sexual activity: Never    Birth control/protection: Post-menopausal  Other Topics Concern  . Not on file  Social History Narrative  . Not on file   Social Determinants of Health   Financial Resource Strain:   . Difficulty of Paying Living Expenses: Not on file  Food Insecurity:   . Worried About Charity fundraiser in the Last Year: Not on file  . Ran Out of Food in the Last Year: Not on file  Transportation Needs:   . Lack of Transportation (Medical): Not on file  . Lack of Transportation (Non-Medical): Not on file  Physical Activity:   . Days of Exercise per Week: Not on file  . Minutes of Exercise per Session: Not on file  Stress:   . Feeling of Stress : Not on file  Social Connections:   . Frequency of Communication with Friends and Family: Not on file  . Frequency of Social Gatherings with Friends and Family: Not on file  . Attends Religious Services: Not on file  . Active Member of Clubs or Organizations: Not on file  . Attends Archivist Meetings: Not on file  . Marital Status: Not on file    Allergies:  Allergies  Allergen Reactions  . Sulfa Antibiotics Rash  . Sulfonamide Derivatives Rash    REACTION: rash    Metabolic Disorder Labs: Lab Results  Component Value Date   HGBA1C 7.2 (H) 06/27/2018   MPG 160 06/27/2018   MPG 134 03/15/2018   No results found for: PROLACTIN Lab Results   Component Value Date   CHOL 133 10/07/2016   TRIG 252 (H) 10/07/2016   HDL 28 (L) 10/07/2016   CHOLHDL 4.8 10/07/2016   VLDL 50 (H) 10/07/2016   LDLCALC 55 10/07/2016   LDLCALC (H) 01/03/2009    143        Total Cholesterol/HDL:CHD Risk Coronary Heart Disease Risk Table                     Men   Women  1/2 Average Risk   3.4   3.3  Average Risk       5.0   4.4  2 X Average Risk   9.6   7.1  3 X Average  Risk  23.4   11.0        Use the calculated Patient Ratio above and the CHD Risk Table to determine the patient's CHD Risk.        ATP III CLASSIFICATION (LDL):  <100     mg/dL   Optimal  100-129  mg/dL   Near or Above                    Optimal  130-159  mg/dL   Borderline  160-189  mg/dL   High  >190     mg/dL   Very High   Lab Results  Component Value Date   TSH 1.138 03/29/2017   TSH 0.538 02/13/2016    Therapeutic Level Labs: No results found for: LITHIUM No results found for: VALPROATE No components found for:  CBMZ  Current Medications: Current Outpatient Medications  Medication Sig Dispense Refill  . ABILIFY 2 MG tablet Take 1 tablet (2 mg total) by mouth daily. 28 tablet 3  . acetaminophen (TYLENOL) 325 MG tablet Take 650 mg by mouth 3 (three) times daily as needed.    . calcium carbonate (TUMS - DOSED IN MG ELEMENTAL CALCIUM) 500 MG chewable tablet Chew 1 tablet by mouth 4 (four) times daily as needed for indigestion or heartburn.     . cholecalciferol (VITAMIN D) 1000 units tablet Take 2,000 Units by mouth daily.    . clonazePAM (KLONOPIN) 0.5 MG tablet Take 1 tablet (0.5 mg total) by mouth 3 (three) times daily. (Patient taking differently: Take 0.5 mg by mouth 3 (three) times daily as needed. ) 90 tablet 2  . docusate sodium (COLACE) 100 MG capsule Take 200 mg by mouth at bedtime.     . DULoxetine (CYMBALTA) 60 MG capsule Take 120 mg by mouth daily.     Marland Kitchen esomeprazole (NEXIUM) 40 MG capsule Take 40 mg by mouth daily at 12 noon.    . gabapentin  (NEURONTIN) 300 MG capsule Take 300 mg by mouth 3 (three) times daily.    Marland Kitchen HYDROcodone-acetaminophen (NORCO) 10-325 MG tablet Take 1 tablet by mouth every 8 (eight) hours as needed.     . hydrocortisone (ANUSOL-HC) 2.5 % rectal cream Place 1 application rectally 2 (two) times daily as needed.     . hydrocortisone (ANUSOL-HC) 2.5 % rectal cream Place 1 application rectally 2 (two) times daily as needed (rectal discomfort of bleeding; up to 10 days at a time). 30 g 1  . iron polysaccharides (NIFEREX) 150 MG capsule Take 1 capsule (150 mg total) by mouth daily. 30 capsule 11  . latanoprost (XALATAN) 0.005 % ophthalmic solution Place 1 drop into both eyes at bedtime.    Marland Kitchen LEVEMIR FLEXTOUCH 100 UNIT/ML Pen INJECT 28 UNITS INTO THE SKIN AT BEDTIME. 15 mL 3  . loratadine (CLARITIN) 10 MG tablet Take 10 mg by mouth daily.    Marland Kitchen lubiprostone (AMITIZA) 24 MCG capsule Take 1 capsule (24 mcg total) by mouth 2 (two) times daily with a meal. 180 capsule 3  . metoprolol succinate (TOPROL-XL) 25 MG 24 hr tablet Take 0.5 tablets (12.5 mg total) by mouth daily. (Patient taking differently: Take 12.5 mg by mouth 2 (two) times daily. )    . midodrine (PROAMATINE) 2.5 MG tablet Take 5 mg by mouth 3 (three) times daily with meals.     . mirabegron ER (MYRBETRIQ) 25 MG TB24 tablet Take 25 mg by mouth daily.    . ondansetron (ZOFRAN) 4 MG tablet  Take 1 tablet (4 mg total) by mouth 4 (four) times daily -  before meals and at bedtime. (Patient taking differently: Take 4 mg by mouth as needed. ) 120 tablet 3  . pantoprazole (PROTONIX) 40 MG tablet Take 40 mg by mouth daily.    . polyethylene glycol powder (GLYCOLAX/MIRALAX) 17 GM/SCOOP powder Take 17 g by mouth 2 (two) times daily as needed (straining or no BM in 2 days). 255 g 0  . risperiDONE (RISPERDAL) 1 MG tablet Take 1 tablet (1 mg total) by mouth at bedtime. 28 tablet 3  . simvastatin (ZOCOR) 20 MG tablet Take 20 mg by mouth at bedtime.     Marland Kitchen tiZANidine (ZANAFLEX) 2 MG  tablet Take 2 mg by mouth daily.    Marland Kitchen warfarin (COUMADIN) 5 MG tablet Take 1 tablet tonight then increase dose to 1 tablet daily except 1/2 tablet on Mondays, Wednesdays and Fridays 26 tablet 6  . zolpidem (AMBIEN) 10 MG tablet Take 1 tablet (10 mg total) by mouth at bedtime. Please hold this medication if she has drowsiness 30 tablet 3   No current facility-administered medications for this visit.    Musculoskeletal: Strength & Muscle Tone: unable to assess due to telemed visit Gait & Station: unable to assess due to telemed visit Patient leans: unable to assess due to telemed visit  Psychiatric Specialty Exam: Review of Systems  There were no vitals taken for this visit.There is no height or weight on file to calculate BMI.  General Appearance: unable to assess due to phone visit  Eye Contact:  unable to assess due to phone visit  Speech:  Normal Rate  Volume:  Normal  Mood:  Euthymic  Affect:  Congruent  Thought Process:  Goal Directed, Linear and Descriptions of Associations: Intact  Orientation:  Full (Time, Place, and Person)  Thought Content: Logical   Suicidal Thoughts:  No  Homicidal Thoughts:  No  Memory:  Recent;   Good Remote;   Fair  Judgement:  Fair  Insight:  Fair  Psychomotor Activity:  unable to assess due to phone visit  Concentration:  Concentration: Fair and Attention Span: Fair  Recall:  [employer] of Knowledge: Fair  Language: Good  Akathisia:  unable to assess due to phone visit  Handed:  Right  AIMS (if indicated):unable to assess due to phone visit  Assets:  Communication Skills Desire for Improvement Financial Resources/Insurance Housing  ADL's:  Intact  Cognition: WNL  Sleep:  Good   Screenings: PHQ2-9     Office Visit from 11/28/2017 in [facility] Office Visit from 09/25/2017 in [facility] Office Visit from 03/28/2017 in [facility] Office Visit from 09/13/2016 in [facility] Office Visit from 06/07/2016 in [facility]  PHQ-2 Total Score  0  0  0  0  0       Assessment and Plan: Patient appears to be doing well as per the residential facility staff.  We will continue same regimen for now.  1. Schizophrenia, unspecified type ([place]) - zolpidem (AMBIEN) 10 MG tablet; Take 1 tablet (10 mg total) by mouth at bedtime. Please hold this medication if she has drowsiness  Dispense: 30 tablet; Refill: 2 - risperiDONE (RISPERDAL) 1 MG tablet; Take 1 tablet (1 mg total) by mouth at bedtime.  Dispense: 28 tablet; Refill: 2 - ABILIFY 2 MG tablet; Take 1 tablet (2 mg total) by mouth daily.  Dispense: 28 tablet; Refill: 2 - Continue duloxetine  120 mg daily(prescribed by Dr. Legrand Rams.)  - Continueclonazepam 0.5 mg twicea day for anxiety (prescribed by Dr. Merlene Laughter)  F/up in 3 months.   [place] Crane, MD 02/21/2019, 9:13 AM

## 2019-02-24 ENCOUNTER — Other Ambulatory Visit: Payer: Self-pay

## 2019-02-24 ENCOUNTER — Ambulatory Visit (INDEPENDENT_AMBULATORY_CARE_PROVIDER_SITE_OTHER): Payer: Medicare Other | Admitting: Nurse Practitioner

## 2019-02-24 ENCOUNTER — Encounter: Payer: Self-pay | Admitting: Nurse Practitioner

## 2019-02-24 VITALS — BP 121/59 | HR 73 | Temp 96.2°F | Ht 66.0 in | Wt 125.8 lb

## 2019-02-24 DIAGNOSIS — M48061 Spinal stenosis, lumbar region without neurogenic claudication: Secondary | ICD-10-CM | POA: Diagnosis not present

## 2019-02-24 DIAGNOSIS — M4316 Spondylolisthesis, lumbar region: Secondary | ICD-10-CM | POA: Diagnosis not present

## 2019-02-24 DIAGNOSIS — R1031 Right lower quadrant pain: Secondary | ICD-10-CM | POA: Diagnosis not present

## 2019-02-24 DIAGNOSIS — R1032 Left lower quadrant pain: Secondary | ICD-10-CM | POA: Diagnosis not present

## 2019-02-24 DIAGNOSIS — U071 COVID-19: Secondary | ICD-10-CM | POA: Diagnosis not present

## 2019-02-24 DIAGNOSIS — R197 Diarrhea, unspecified: Secondary | ICD-10-CM

## 2019-02-24 DIAGNOSIS — Z23 Encounter for immunization: Secondary | ICD-10-CM | POA: Diagnosis not present

## 2019-02-24 DIAGNOSIS — R5383 Other fatigue: Secondary | ICD-10-CM | POA: Diagnosis not present

## 2019-02-24 DIAGNOSIS — K5900 Constipation, unspecified: Secondary | ICD-10-CM | POA: Diagnosis not present

## 2019-02-24 DIAGNOSIS — I69354 Hemiplegia and hemiparesis following cerebral infarction affecting left non-dominant side: Secondary | ICD-10-CM | POA: Diagnosis not present

## 2019-02-24 DIAGNOSIS — M21372 Foot drop, left foot: Secondary | ICD-10-CM | POA: Diagnosis not present

## 2019-02-24 NOTE — Assessment & Plan Note (Addendum)
Diarrhea is significantly improved.  Recommend continue current medications and follow-up in 6 months.  Call for any worsening or severe symptoms.

## 2019-02-24 NOTE — Assessment & Plan Note (Signed)
Constipation significantly improved.  Recommend continue current medications and follow-up in 6 months.  Call for any worsening symptoms.

## 2019-02-24 NOTE — Assessment & Plan Note (Signed)
Some persistent lower abdominal pain which she describes as very mild and tolerable.  I feel this is likely her baseline chronic lower abdominal pain.  Constipation and diarrhea have resolved.  Her pain is typically associated with a bowel movement and improves with bowel movement.  Likely IBS component.  Recommend she continue her current medications.  Call for any worsening or severe pain.  We will see her back in the office in 6 months and if she is having any worsening symptoms or if the symptoms have become bothersome enough we can consider CT imaging.  Her last CT was in 2019 about 2 years ago and was essentially unremarkable.  She declines colonoscopy at this time.

## 2019-02-24 NOTE — Patient Instructions (Signed)
Your health issues we discussed today were:   Abdominal pain with constipation and diarrhea: 1. I am glad your constipation and diarrhea are doing better! 2. Continue taking your current medications that help manage her constipation and diarrhea 3. I am glad your abdominal pain is not bothersome! 4. Call us if your pain becomes worse 5. If your pain does become worse or if it is more bothersome at your follow-up visit we can consider a CT scan  Overall I recommend:  1. Continue your other current medications 2. Return for follow-up in 6 months 3. Call us if you have any questions or concerns   ---------------------------------------------------------------  I am glad you got your vaccine for coronavirus!  Hopefully this will help keep you safe!  ---------------------------------------------------------------   At [facility] we value your feedback. You may receive a survey about your visit today. Please share your experience as we strive to create trusting relationships with our patients to provide genuine, compassionate, quality care.  We appreciate your understanding and patience as we review any laboratory studies, imaging, and other diagnostic tests that are ordered as we care for you. Our office policy is 5 business days for review of these results, and any emergent or urgent results are addressed in a timely manner for your best interest. If you do not hear from our office in 1 week, please contact us.   We also encourage the use of MyChart, which contains your medical information for your review as well. If you are not enrolled in this feature, an access code is on this after visit summary for your convenience. Thank you for allowing [place] to be involved in your care.  It was great to see you today!  I hope you have a great day!!

## 2019-02-24 NOTE — Progress Notes (Signed)
Referring Provider: [name], MD Primary Care Physician:  [name], MD Primary GI:  Dr. Oneida Alar  Chief Complaint  Patient presents with  . Follow-up    diarrhea and constipation better,abd and side pain    HPI:   [name] is a 74 y.o. female who presents for follow-up on constipation and diarrhea.  Patient was last seen in our office 11/14/2018 for abdominal pain, constipation, rectal bleeding.  Noted history of IBS and IDA.  Previous bleeding work-up including colonoscopy/EGD/capsule study in 2016 felt IDA related to erosive gastropathy and enteropathy with recommended repeat colonoscopy in 2019.  Also noted history of liver hemangioma on imaging.  Updated EGD 2018 with chronic gastritis negative for H. pylori.  IBS is typically mixed type and generally has been well controlled on a regimen of MiraLAX and Imodium.  At her last visit she noted some worsening abdominal pain that started a week prior which is achy, intermittent, lasts a day and occurs after a bowel movement about 3 times a week and is always associated with a bowel movement.  Worsening constipation with straining and hard stools, although sometimes soft.  Hematochezia with straining minimal to medium amount.  Noted rectal discomfort not on hemorrhoid cream.  No other overt GI complaints.  Her bowel regimen at that time was Colace daily, Amitiza 24 mcg daily, MiraLAX as needed.  Recommended Anusol rectal cream for rectal discomfort, continue Colace daily, increase Amitiza 24 mcg to twice daily with meals.  The nursing facility can change this to twice daily alternated with once daily every other day.  MiraLAX 1-2 times a day if no bowel movement in 2 days.  Antidiarrhea as needed.  Follow-up in 3 months.  Today she states she's doing ok overall. Diarrhea and constipation is improved. Still with abdominal pain and side pain. She states she doesn't want another colonoscopy. Still with abdominal pain lower abdomen.  Denies dysuria and hematuria. Typically associated with a bowel movement and improves after a bowel movement. Denies hematochezia. Some intermittent nausea, no vomiting. Has nausea medication which helps somewhat. Denies fever, chills. States she doesn't have much of an appetite. However, objectively her weight is up about 8 lbs since her last visit. Denies chest pain, dyspnea, dizziness, lightheadedness, syncope, near syncope. Denies any other upper or lower GI symptoms. Has intermittent sore throat, which seems to be her baseline. There has been multiple COVID+ in the facility. She did test positive about a month ago. She was asymptomatic. Denies chest pain, dyspnea, dizziness, lightheadedness, syncope, near syncope. Denies any other upper or lower GI symptoms.  At the end of the visit she states her abdominal pain is "not that bad, I can tolerate it no problem."   Past Medical History:  Diagnosis Date  . Anxiety   . Arthritis   . COVID-19 virus infection    COVID-19+ approx 01/24/19; asymptomatic course with full recovery  . Dependence on wheelchair    pivot/transfers  . Depression    History of psychosis and previous suicide attempt  . DVT, lower extremity, recurrent (HCC)    Long-term Coumadin per Dr. Legrand Rams  . Essential hypertension   . GERD (gastroesophageal reflux disease)   . Hemiplegia ([place]) 2010   Left side  . History of stroke    Acute infarct and right cerebral white matter small vessel disease 12/10  . Leg DVT (deep venous thromboembolism), acute ([place]) 2006  . Schizophrenia ([place])   . Stroke [facility])    left  sided weakness  . Type 2 diabetes mellitus ([place])     Past Surgical History:  Procedure Laterality Date  . BACK SURGERY    . BIOPSY N/A 11/24/2014   Procedure: BIOPSY;  Surgeon: Danie Binder, MD;  Location: [facility];  Service: Endoscopy;  Laterality: N/A;  . BIOPSY  05/17/2016   Procedure: BIOPSY;  Surgeon: Danie Binder, MD;  Location: [facility];  Service: Endoscopy;;   gastric biopsy  . COLONOSCOPY WITH PROPOFOL N/A 11/24/2014   Dr. Rudie Meyer polyps removed/moderate sized internal hemorrhoids, tubular adenomas. Next surveillance in 3 years  . ESOPHAGOGASTRODUODENOSCOPY (EGD) WITH PROPOFOL N/A 11/24/2014   Dr. Clayburn Pert HH/patent stricture at the gastroesophageal junction, mild non-erosive gastritis, path negative for H.pylori or celiac sprue  . ESOPHAGOGASTRODUODENOSCOPY (EGD) WITH PROPOFOL N/A 05/17/2016   Procedure: ESOPHAGOGASTRODUODENOSCOPY (EGD) WITH PROPOFOL;  Surgeon: Danie Binder, MD;  Location: [facility];  Service: Endoscopy;  Laterality: N/A;  12:45pm  . GIVENS CAPSULE STUDY N/A 12/11/2014   MULTILPLE EROSION IN the stomach WITH ACTIVE OOZING. OCCASIONAL EROSIONS AND RARE ULCER SEEN IN PROXIMAL SMALL BOWEL . No masses or AVMs SEEN. NO OLD BLOOD OR FRESH BLOOD SEEN.   . POLYPECTOMY N/A 11/24/2014   Procedure: POLYPECTOMY;  Surgeon: Danie Binder, MD;  Location: [facility];  Service: Endoscopy;  Laterality: N/A;  . SAVORY DILATION N/A 05/17/2016   Procedure: SAVORY DILATION;  Surgeon: Danie Binder, MD;  Location: [facility];  Service: Endoscopy;  Laterality: N/A;    Current Outpatient Medications  Medication Sig Dispense Refill  . ABILIFY 2 MG tablet Take 1 tablet (2 mg total) by mouth daily. 28 tablet 2  . acetaminophen (TYLENOL) 325 MG tablet Take 650 mg by mouth 3 (three) times daily as needed.    . calcium carbonate (TUMS - DOSED IN MG ELEMENTAL CALCIUM) 500 MG chewable tablet Chew 1 tablet by mouth 4 (four) times daily as needed for indigestion or heartburn.     . cholecalciferol (VITAMIN D) 1000 units tablet Take 2,000 Units by mouth daily.    . clonazePAM (KLONOPIN) 0.5 MG tablet Take 1 tablet (0.5 mg total) by mouth 3 (three) times daily. 90 tablet 2  . docusate sodium (COLACE) 100 MG capsule Take 200 mg by mouth at bedtime.     . DULoxetine (CYMBALTA) 60 MG capsule Take 120 mg by mouth daily.     Marland Kitchen gabapentin (NEURONTIN) 300 MG  capsule Take 300 mg by mouth 3 (three) times daily.    Marland Kitchen HYDROcodone-acetaminophen (NORCO) 10-325 MG tablet Take 1 tablet by mouth every 8 (eight) hours as needed.     . hydrocortisone (ANUSOL-HC) 2.5 % rectal cream Place 1 application rectally 2 (two) times daily as needed.     . iron polysaccharides (NIFEREX) 150 MG capsule Take 1 capsule (150 mg total) by mouth daily. 30 capsule 11  . latanoprost (XALATAN) 0.005 % ophthalmic solution Place 1 drop into both eyes at bedtime.    Marland Kitchen LEVEMIR FLEXTOUCH 100 UNIT/ML Pen INJECT 28 UNITS INTO THE SKIN AT BEDTIME. 15 mL 3  . loratadine (CLARITIN) 10 MG tablet Take 10 mg by mouth daily.    Marland Kitchen lubiprostone (AMITIZA) 24 MCG capsule Take 1 capsule (24 mcg total) by mouth 2 (two) times daily with a meal. 180 capsule 3  . metoprolol succinate (TOPROL-XL) 25 MG 24 hr tablet Take 0.5 tablets (12.5 mg total) by mouth daily. (Patient taking differently: Take 12.5 mg by mouth 2 (two) times daily. )    .  midodrine (PROAMATINE) 2.5 MG tablet Take 5 mg by mouth 3 (three) times daily with meals.     . mirabegron ER (MYRBETRIQ) 25 MG TB24 tablet Take 25 mg by mouth daily.    . mirtazapine (REMERON) 30 MG tablet Take 30 mg by mouth at bedtime.    . ondansetron (ZOFRAN) 4 MG tablet Take 1 tablet (4 mg total) by mouth 4 (four) times daily -  before meals and at bedtime. 120 tablet 3  . pantoprazole (PROTONIX) 40 MG tablet Take 40 mg by mouth daily.    . risperiDONE (RISPERDAL) 1 MG tablet Take 1 tablet (1 mg total) by mouth at bedtime. 28 tablet 2  . simvastatin (ZOCOR) 20 MG tablet Take 20 mg by mouth at bedtime.     Marland Kitchen tiZANidine (ZANAFLEX) 2 MG tablet Take 2 mg by mouth daily.    Marland Kitchen warfarin (COUMADIN) 5 MG tablet Take 1 tablet tonight then increase dose to 1 tablet daily except 1/2 tablet on Mondays, Wednesdays and Fridays 26 tablet 6  . zolpidem (AMBIEN) 10 MG tablet Take 1 tablet (10 mg total) by mouth at bedtime. Please hold this medication if she has drowsiness 30 tablet  2   No current facility-administered medications for this visit.    Allergies as of 02/24/2019 - Review Complete 02/24/2019  Allergen Reaction Noted  . Sulfa antibiotics Rash 02/23/2010  . Sulfonamide derivatives Rash     Family History  Problem Relation Age of Onset  . Hypertension Mother   . Colon cancer Neg Hx     Social History   Socioeconomic History  . Marital status: Widowed    Spouse name: Not on file  . Number of children: Not on file  . Years of education: Not on file  . Highest education level: Not on file  Occupational History  . Not on file  Tobacco Use  . Smoking status: Former Smoker    Packs/day: 0.25    Years: 20.00    Pack years: 5.00    Types: Cigarettes    Quit date: 01/24/1995    Years since quitting: 24.1  . Smokeless tobacco: Never Used  Substance and Sexual Activity  . Alcohol use: No    Alcohol/week: 0.0 standard drinks  . Drug use: No  . Sexual activity: Never    Birth control/protection: Post-menopausal  Other Topics Concern  . Not on file  Social History Narrative  . Not on file   Social Determinants of Health   Financial Resource Strain:   . Difficulty of Paying Living Expenses: Not on file  Food Insecurity:   . Worried About Charity fundraiser in the Last Year: Not on file  . Ran Out of Food in the Last Year: Not on file  Transportation Needs:   . Lack of Transportation (Medical): Not on file  . Lack of Transportation (Non-Medical): Not on file  Physical Activity:   . Days of Exercise per Week: Not on file  . Minutes of Exercise per Session: Not on file  Stress:   . Feeling of Stress : Not on file  Social Connections:   . Frequency of Communication with Friends and Family: Not on file  . Frequency of Social Gatherings with Friends and Family: Not on file  . Attends Religious Services: Not on file  . Active Member of Clubs or Organizations: Not on file  . Attends Archivist Meetings: Not on file  . Marital  Status: Not on file  Review of Systems: General: Negative for anorexia, weight loss, fever, chills, fatigue, weakness. ENT: Negative for hoarseness, difficulty swallowing. CV: Negative for chest pain, angina, palpitations, peripheral edema.  Respiratory: Negative for dyspnea at rest, cough, sputum, wheezing.  GI: See history of present illness. Endo: Negative for unusual weight change.  Heme: Negative for bruising or bleeding. Allergy: Negative for rash or hives.   Physical Exam: BP (!) 121/59   Pulse 73   Temp (!) 96.2 F (35.7 C)   Ht 5\' 6"  (1.676 m)   Wt 125 lb 12.8 oz (57.1 kg)   BMI 20.30 kg/m  General:   Alert and oriented. Pleasant and cooperative. Well-nourished and well-developed. In a wheelchair. Eyes:  Without icterus, sclera clear and conjunctiva pink.  Ears:  Normal auditory acuity. Cardiovascular:  S1, S2 present without murmurs appreciated. Extremities without clubbing or edema. Respiratory:  Clear to auscultation bilaterally. No wheezes, rales, or rhonchi. No distress.  Gastrointestinal:  +BS, soft, and non-distended. Minimal to mild lower abdominal TTP. No HSM noted. No guarding or rebound. No masses appreciated.  Rectal:  Deferred  Musculoskalatal:  Symmetrical without gross deformities. Neurologic:  Alert and oriented x4;  grossly normal neurologically. Psych:  Alert and cooperative. Normal mood and affect. Heme/Lymph/Immune: No excessive bruising noted.    02/24/2019 2:04 PM   Disclaimer: This note was dictated with voice recognition software. Similar sounding words can inadvertently be transcribed and may not be corrected upon review.

## 2019-02-25 ENCOUNTER — Ambulatory Visit (INDEPENDENT_AMBULATORY_CARE_PROVIDER_SITE_OTHER): Payer: Medicare Other | Admitting: *Deleted

## 2019-02-25 DIAGNOSIS — Z8679 Personal history of other diseases of the circulatory system: Secondary | ICD-10-CM | POA: Diagnosis not present

## 2019-02-25 DIAGNOSIS — R5383 Other fatigue: Secondary | ICD-10-CM | POA: Diagnosis not present

## 2019-02-25 DIAGNOSIS — I82409 Acute embolism and thrombosis of unspecified deep veins of unspecified lower extremity: Secondary | ICD-10-CM | POA: Diagnosis not present

## 2019-02-25 DIAGNOSIS — U071 COVID-19: Secondary | ICD-10-CM | POA: Diagnosis not present

## 2019-02-25 DIAGNOSIS — M21372 Foot drop, left foot: Secondary | ICD-10-CM | POA: Diagnosis not present

## 2019-02-25 DIAGNOSIS — Z5181 Encounter for therapeutic drug level monitoring: Secondary | ICD-10-CM | POA: Diagnosis not present

## 2019-02-25 DIAGNOSIS — M48061 Spinal stenosis, lumbar region without neurogenic claudication: Secondary | ICD-10-CM | POA: Diagnosis not present

## 2019-02-25 DIAGNOSIS — I69354 Hemiplegia and hemiparesis following cerebral infarction affecting left non-dominant side: Secondary | ICD-10-CM | POA: Diagnosis not present

## 2019-02-25 DIAGNOSIS — M4316 Spondylolisthesis, lumbar region: Secondary | ICD-10-CM | POA: Diagnosis not present

## 2019-02-25 LAB — POCT INR: INR: 2.4 (ref 2.0–3.0)

## 2019-02-25 NOTE — Progress Notes (Signed)
Cc'ed to pcp °

## 2019-02-25 NOTE — Patient Instructions (Signed)
Continue warfarin 5mg  daily except 2.5mg  on Mondays, Wednesdays and Fridays Recheck in 4 weeks Orders written for [employer] [phone]

## 2019-02-26 DIAGNOSIS — U071 COVID-19: Secondary | ICD-10-CM | POA: Diagnosis not present

## 2019-02-26 DIAGNOSIS — M48061 Spinal stenosis, lumbar region without neurogenic claudication: Secondary | ICD-10-CM | POA: Diagnosis not present

## 2019-02-26 DIAGNOSIS — I69354 Hemiplegia and hemiparesis following cerebral infarction affecting left non-dominant side: Secondary | ICD-10-CM | POA: Diagnosis not present

## 2019-02-26 DIAGNOSIS — M4316 Spondylolisthesis, lumbar region: Secondary | ICD-10-CM | POA: Diagnosis not present

## 2019-02-26 DIAGNOSIS — M21372 Foot drop, left foot: Secondary | ICD-10-CM | POA: Diagnosis not present

## 2019-02-26 DIAGNOSIS — R5383 Other fatigue: Secondary | ICD-10-CM | POA: Diagnosis not present

## 2019-02-27 DIAGNOSIS — U071 COVID-19: Secondary | ICD-10-CM | POA: Diagnosis not present

## 2019-02-27 DIAGNOSIS — M21372 Foot drop, left foot: Secondary | ICD-10-CM | POA: Diagnosis not present

## 2019-02-27 DIAGNOSIS — R5383 Other fatigue: Secondary | ICD-10-CM | POA: Diagnosis not present

## 2019-02-27 DIAGNOSIS — I69354 Hemiplegia and hemiparesis following cerebral infarction affecting left non-dominant side: Secondary | ICD-10-CM | POA: Diagnosis not present

## 2019-02-27 DIAGNOSIS — M4316 Spondylolisthesis, lumbar region: Secondary | ICD-10-CM | POA: Diagnosis not present

## 2019-02-27 DIAGNOSIS — M48061 Spinal stenosis, lumbar region without neurogenic claudication: Secondary | ICD-10-CM | POA: Diagnosis not present

## 2019-03-03 DIAGNOSIS — I69354 Hemiplegia and hemiparesis following cerebral infarction affecting left non-dominant side: Secondary | ICD-10-CM | POA: Diagnosis not present

## 2019-03-03 DIAGNOSIS — M21372 Foot drop, left foot: Secondary | ICD-10-CM | POA: Diagnosis not present

## 2019-03-03 DIAGNOSIS — R5383 Other fatigue: Secondary | ICD-10-CM | POA: Diagnosis not present

## 2019-03-03 DIAGNOSIS — M48061 Spinal stenosis, lumbar region without neurogenic claudication: Secondary | ICD-10-CM | POA: Diagnosis not present

## 2019-03-03 DIAGNOSIS — U071 COVID-19: Secondary | ICD-10-CM | POA: Diagnosis not present

## 2019-03-03 DIAGNOSIS — M4316 Spondylolisthesis, lumbar region: Secondary | ICD-10-CM | POA: Diagnosis not present

## 2019-03-04 DIAGNOSIS — M4316 Spondylolisthesis, lumbar region: Secondary | ICD-10-CM | POA: Diagnosis not present

## 2019-03-04 DIAGNOSIS — M21372 Foot drop, left foot: Secondary | ICD-10-CM | POA: Diagnosis not present

## 2019-03-04 DIAGNOSIS — R5383 Other fatigue: Secondary | ICD-10-CM | POA: Diagnosis not present

## 2019-03-04 DIAGNOSIS — U071 COVID-19: Secondary | ICD-10-CM | POA: Diagnosis not present

## 2019-03-04 DIAGNOSIS — I69354 Hemiplegia and hemiparesis following cerebral infarction affecting left non-dominant side: Secondary | ICD-10-CM | POA: Diagnosis not present

## 2019-03-04 DIAGNOSIS — M48061 Spinal stenosis, lumbar region without neurogenic claudication: Secondary | ICD-10-CM | POA: Diagnosis not present

## 2019-03-05 DIAGNOSIS — M48061 Spinal stenosis, lumbar region without neurogenic claudication: Secondary | ICD-10-CM | POA: Diagnosis not present

## 2019-03-05 DIAGNOSIS — U071 COVID-19: Secondary | ICD-10-CM | POA: Diagnosis not present

## 2019-03-05 DIAGNOSIS — I69354 Hemiplegia and hemiparesis following cerebral infarction affecting left non-dominant side: Secondary | ICD-10-CM | POA: Diagnosis not present

## 2019-03-05 DIAGNOSIS — R5383 Other fatigue: Secondary | ICD-10-CM | POA: Diagnosis not present

## 2019-03-05 DIAGNOSIS — M21372 Foot drop, left foot: Secondary | ICD-10-CM | POA: Diagnosis not present

## 2019-03-05 DIAGNOSIS — M4316 Spondylolisthesis, lumbar region: Secondary | ICD-10-CM | POA: Diagnosis not present

## 2019-03-06 ENCOUNTER — Telehealth: Payer: Self-pay

## 2019-03-06 DIAGNOSIS — M48061 Spinal stenosis, lumbar region without neurogenic claudication: Secondary | ICD-10-CM | POA: Diagnosis not present

## 2019-03-06 DIAGNOSIS — I69354 Hemiplegia and hemiparesis following cerebral infarction affecting left non-dominant side: Secondary | ICD-10-CM | POA: Diagnosis not present

## 2019-03-06 DIAGNOSIS — K582 Mixed irritable bowel syndrome: Secondary | ICD-10-CM

## 2019-03-06 DIAGNOSIS — R5383 Other fatigue: Secondary | ICD-10-CM | POA: Diagnosis not present

## 2019-03-06 DIAGNOSIS — M4316 Spondylolisthesis, lumbar region: Secondary | ICD-10-CM | POA: Diagnosis not present

## 2019-03-06 DIAGNOSIS — M21372 Foot drop, left foot: Secondary | ICD-10-CM | POA: Diagnosis not present

## 2019-03-06 DIAGNOSIS — U071 COVID-19: Secondary | ICD-10-CM | POA: Diagnosis not present

## 2019-03-06 MED ORDER — MOTEGRITY 2 MG PO TABS: 1.0000 | ORAL_TABLET | Freq: Every day | ORAL | 5 refills | Status: DC

## 2019-03-06 NOTE — Telephone Encounter (Signed)
T/C from pharmacist at [facility] [phone]) stating that Amitiza has gone generic, but pt's insurance will no longer pay for any Amitiza.  The insurance will not cover [place] or Motegrity.  Pt has enough of the Amitiza until end of month and will then need new Rx.   Randall Hiss, please advise!

## 2019-03-06 NOTE — Telephone Encounter (Signed)
Previously tried and failed Linzess. We can try Motegrity 2mg  daily. Continue MiraLAX as currently taking. Rx has been sent to pharmacy.  Call if this is not effective at which point we'd need to appeal for Amitiza.

## 2019-03-06 NOTE — Addendum Note (Signed)
Addended by: Gordy Levan, Glee Lashomb A on: 03/06/2019 03:32 PM   Modules accepted: Orders

## 2019-03-06 NOTE — Telephone Encounter (Signed)
I called and informed pharmacist and then it was not going to cover [place].  She is going to start a PA for the Amitiza ( generic) .   Pt still has med at this time.

## 2019-03-09 DIAGNOSIS — R5383 Other fatigue: Secondary | ICD-10-CM | POA: Diagnosis not present

## 2019-03-09 DIAGNOSIS — M21372 Foot drop, left foot: Secondary | ICD-10-CM | POA: Diagnosis not present

## 2019-03-09 DIAGNOSIS — I69354 Hemiplegia and hemiparesis following cerebral infarction affecting left non-dominant side: Secondary | ICD-10-CM | POA: Diagnosis not present

## 2019-03-09 DIAGNOSIS — M4316 Spondylolisthesis, lumbar region: Secondary | ICD-10-CM | POA: Diagnosis not present

## 2019-03-09 DIAGNOSIS — M48061 Spinal stenosis, lumbar region without neurogenic claudication: Secondary | ICD-10-CM | POA: Diagnosis not present

## 2019-03-09 DIAGNOSIS — U071 COVID-19: Secondary | ICD-10-CM | POA: Diagnosis not present

## 2019-03-10 NOTE — Telephone Encounter (Signed)
Started PA for the generic Amitiza 24 mcg bid.

## 2019-03-11 DIAGNOSIS — R5383 Other fatigue: Secondary | ICD-10-CM | POA: Diagnosis not present

## 2019-03-11 DIAGNOSIS — I69354 Hemiplegia and hemiparesis following cerebral infarction affecting left non-dominant side: Secondary | ICD-10-CM | POA: Diagnosis not present

## 2019-03-11 DIAGNOSIS — U071 COVID-19: Secondary | ICD-10-CM | POA: Diagnosis not present

## 2019-03-11 DIAGNOSIS — M4316 Spondylolisthesis, lumbar region: Secondary | ICD-10-CM | POA: Diagnosis not present

## 2019-03-11 DIAGNOSIS — M48061 Spinal stenosis, lumbar region without neurogenic claudication: Secondary | ICD-10-CM | POA: Diagnosis not present

## 2019-03-11 DIAGNOSIS — M21372 Foot drop, left foot: Secondary | ICD-10-CM | POA: Diagnosis not present

## 2019-03-11 NOTE — Telephone Encounter (Signed)
Noted  

## 2019-03-12 DIAGNOSIS — M79674 Pain in right toe(s): Secondary | ICD-10-CM | POA: Diagnosis not present

## 2019-03-12 DIAGNOSIS — M79675 Pain in left toe(s): Secondary | ICD-10-CM | POA: Diagnosis not present

## 2019-03-12 DIAGNOSIS — B351 Tinea unguium: Secondary | ICD-10-CM | POA: Diagnosis not present

## 2019-03-15 DIAGNOSIS — U071 COVID-19: Secondary | ICD-10-CM | POA: Diagnosis not present

## 2019-03-15 DIAGNOSIS — R5383 Other fatigue: Secondary | ICD-10-CM | POA: Diagnosis not present

## 2019-03-15 DIAGNOSIS — M21372 Foot drop, left foot: Secondary | ICD-10-CM | POA: Diagnosis not present

## 2019-03-15 DIAGNOSIS — I69354 Hemiplegia and hemiparesis following cerebral infarction affecting left non-dominant side: Secondary | ICD-10-CM | POA: Diagnosis not present

## 2019-03-15 DIAGNOSIS — M48061 Spinal stenosis, lumbar region without neurogenic claudication: Secondary | ICD-10-CM | POA: Diagnosis not present

## 2019-03-15 DIAGNOSIS — M4316 Spondylolisthesis, lumbar region: Secondary | ICD-10-CM | POA: Diagnosis not present

## 2019-03-17 DIAGNOSIS — U071 COVID-19: Secondary | ICD-10-CM | POA: Diagnosis not present

## 2019-03-17 DIAGNOSIS — M48061 Spinal stenosis, lumbar region without neurogenic claudication: Secondary | ICD-10-CM | POA: Diagnosis not present

## 2019-03-17 DIAGNOSIS — R5383 Other fatigue: Secondary | ICD-10-CM | POA: Diagnosis not present

## 2019-03-17 DIAGNOSIS — M4316 Spondylolisthesis, lumbar region: Secondary | ICD-10-CM | POA: Diagnosis not present

## 2019-03-17 DIAGNOSIS — I69354 Hemiplegia and hemiparesis following cerebral infarction affecting left non-dominant side: Secondary | ICD-10-CM | POA: Diagnosis not present

## 2019-03-17 DIAGNOSIS — M21372 Foot drop, left foot: Secondary | ICD-10-CM | POA: Diagnosis not present

## 2019-03-17 NOTE — Telephone Encounter (Signed)
As per below, she has tried and failed all doses of Linzess.  We can try Trulance 3 mg to see if it works (I don't see in past med list where it was tried, unless we did samples at some point along the way). If this is not effective, we will need to work on a PA or some sort of appeal.  Let me know if we want to try Trulance or see the status of the PA started by pharmacy.

## 2019-03-17 NOTE — Telephone Encounter (Signed)
Fax from [employer] is not on formulary.  Pt needs to try Linzess 72 mcg, 145 mcg, or 290 mcg or Trulance 3 mg.  Forwarding to Walden Field, NP to advise!

## 2019-03-18 ENCOUNTER — Telehealth: Payer: Self-pay | Admitting: Gastroenterology

## 2019-03-18 DIAGNOSIS — R5383 Other fatigue: Secondary | ICD-10-CM | POA: Diagnosis not present

## 2019-03-18 DIAGNOSIS — M21372 Foot drop, left foot: Secondary | ICD-10-CM | POA: Diagnosis not present

## 2019-03-18 DIAGNOSIS — Z7901 Long term (current) use of anticoagulants: Secondary | ICD-10-CM | POA: Diagnosis not present

## 2019-03-18 DIAGNOSIS — M4316 Spondylolisthesis, lumbar region: Secondary | ICD-10-CM | POA: Diagnosis not present

## 2019-03-18 DIAGNOSIS — U071 COVID-19: Secondary | ICD-10-CM | POA: Diagnosis not present

## 2019-03-18 DIAGNOSIS — E1151 Type 2 diabetes mellitus with diabetic peripheral angiopathy without gangrene: Secondary | ICD-10-CM | POA: Diagnosis not present

## 2019-03-18 DIAGNOSIS — I69354 Hemiplegia and hemiparesis following cerebral infarction affecting left non-dominant side: Secondary | ICD-10-CM | POA: Diagnosis not present

## 2019-03-18 DIAGNOSIS — Z794 Long term (current) use of insulin: Secondary | ICD-10-CM | POA: Diagnosis not present

## 2019-03-18 DIAGNOSIS — M48061 Spinal stenosis, lumbar region without neurogenic claudication: Secondary | ICD-10-CM | POA: Diagnosis not present

## 2019-03-18 DIAGNOSIS — Z5181 Encounter for therapeutic drug level monitoring: Secondary | ICD-10-CM | POA: Diagnosis not present

## 2019-03-18 DIAGNOSIS — F209 Schizophrenia, unspecified: Secondary | ICD-10-CM | POA: Diagnosis not present

## 2019-03-18 DIAGNOSIS — R32 Unspecified urinary incontinence: Secondary | ICD-10-CM | POA: Diagnosis not present

## 2019-03-18 NOTE — Telephone Encounter (Signed)
Left message for a return call from [place].

## 2019-03-18 NOTE — Telephone Encounter (Signed)
I called to request an appeal for the Amitiza 24 mcg.  I spoke to Jenny Reichmann and he is faxing over paperwork and asked to fax office notes etc to them also.

## 2019-03-18 NOTE — Telephone Encounter (Signed)
[name] from [place] called to let us know that patient's insurance doesn't cover Amitiza anymore and was there something else to prescribe in its place. Please advise. [phone]

## 2019-03-19 NOTE — Telephone Encounter (Signed)
I spoke to [place] this morning and she is aware I am working on this PA.

## 2019-03-19 NOTE — Telephone Encounter (Signed)
I spoke to Ed at [facility] and he said he would work on the PA and fax me info.

## 2019-03-20 DIAGNOSIS — I69354 Hemiplegia and hemiparesis following cerebral infarction affecting left non-dominant side: Secondary | ICD-10-CM | POA: Diagnosis not present

## 2019-03-20 DIAGNOSIS — R5383 Other fatigue: Secondary | ICD-10-CM | POA: Diagnosis not present

## 2019-03-20 DIAGNOSIS — M48061 Spinal stenosis, lumbar region without neurogenic claudication: Secondary | ICD-10-CM | POA: Diagnosis not present

## 2019-03-20 DIAGNOSIS — M21372 Foot drop, left foot: Secondary | ICD-10-CM | POA: Diagnosis not present

## 2019-03-20 DIAGNOSIS — U071 COVID-19: Secondary | ICD-10-CM | POA: Diagnosis not present

## 2019-03-20 DIAGNOSIS — M4316 Spondylolisthesis, lumbar region: Secondary | ICD-10-CM | POA: Diagnosis not present

## 2019-03-20 NOTE — Telephone Encounter (Signed)
I have faxed the appeal request to [facility].

## 2019-03-21 NOTE — Telephone Encounter (Signed)
Noted. Will cc to new [place] for follow-up

## 2019-03-21 NOTE — Telephone Encounter (Signed)
I faxed an appeal for Amitiza to [facility] on 03/20/2019.   Left message at [facility] for ED and also at [facility] for [place].   FYI to Walden Field, NP.

## 2019-03-24 DIAGNOSIS — M48061 Spinal stenosis, lumbar region without neurogenic claudication: Secondary | ICD-10-CM | POA: Diagnosis not present

## 2019-03-24 DIAGNOSIS — I69354 Hemiplegia and hemiparesis following cerebral infarction affecting left non-dominant side: Secondary | ICD-10-CM | POA: Diagnosis not present

## 2019-03-24 DIAGNOSIS — M4316 Spondylolisthesis, lumbar region: Secondary | ICD-10-CM | POA: Diagnosis not present

## 2019-03-24 DIAGNOSIS — Z23 Encounter for immunization: Secondary | ICD-10-CM | POA: Diagnosis not present

## 2019-03-24 DIAGNOSIS — U071 COVID-19: Secondary | ICD-10-CM | POA: Diagnosis not present

## 2019-03-24 DIAGNOSIS — M21372 Foot drop, left foot: Secondary | ICD-10-CM | POA: Diagnosis not present

## 2019-03-24 DIAGNOSIS — R5383 Other fatigue: Secondary | ICD-10-CM | POA: Diagnosis not present

## 2019-03-25 ENCOUNTER — Ambulatory Visit (INDEPENDENT_AMBULATORY_CARE_PROVIDER_SITE_OTHER): Payer: Medicare Other | Admitting: *Deleted

## 2019-03-25 ENCOUNTER — Other Ambulatory Visit: Payer: Self-pay

## 2019-03-25 DIAGNOSIS — Z5181 Encounter for therapeutic drug level monitoring: Secondary | ICD-10-CM

## 2019-03-25 DIAGNOSIS — M4316 Spondylolisthesis, lumbar region: Secondary | ICD-10-CM | POA: Diagnosis not present

## 2019-03-25 DIAGNOSIS — R5383 Other fatigue: Secondary | ICD-10-CM | POA: Diagnosis not present

## 2019-03-25 DIAGNOSIS — I82409 Acute embolism and thrombosis of unspecified deep veins of unspecified lower extremity: Secondary | ICD-10-CM | POA: Diagnosis not present

## 2019-03-25 DIAGNOSIS — Z8679 Personal history of other diseases of the circulatory system: Secondary | ICD-10-CM | POA: Diagnosis not present

## 2019-03-25 DIAGNOSIS — M48061 Spinal stenosis, lumbar region without neurogenic claudication: Secondary | ICD-10-CM | POA: Diagnosis not present

## 2019-03-25 DIAGNOSIS — M21372 Foot drop, left foot: Secondary | ICD-10-CM | POA: Diagnosis not present

## 2019-03-25 DIAGNOSIS — I69354 Hemiplegia and hemiparesis following cerebral infarction affecting left non-dominant side: Secondary | ICD-10-CM | POA: Diagnosis not present

## 2019-03-25 DIAGNOSIS — U071 COVID-19: Secondary | ICD-10-CM | POA: Diagnosis not present

## 2019-03-25 LAB — POCT INR: INR: 2.2 (ref 2.0–3.0)

## 2019-03-25 NOTE — Patient Instructions (Signed)
Continue warfarin 5mg  daily except 2.5mg  on Mondays, Wednesdays and Fridays Recheck in 4 weeks Orders written for [employer] [phone]

## 2019-03-26 ENCOUNTER — Telehealth: Payer: Self-pay | Admitting: Gastroenterology

## 2019-03-26 NOTE — Telephone Encounter (Signed)
[facility] called asking for AM. He is wanting to follow up on a medication, Please call [phone]

## 2019-03-26 NOTE — Telephone Encounter (Signed)
Called [facility] and their system is currently down. They're not able to look up any PA's or appeal status due to the system being down. I will call them back.   Called ED at [facility] and he is aware that we discuss further with [facility], when their system is back up. Ed also wanted a d/c note for Amitiza until pt can get her medication. Per [facility], we will not submit a d/c order.

## 2019-03-27 DIAGNOSIS — M48061 Spinal stenosis, lumbar region without neurogenic claudication: Secondary | ICD-10-CM | POA: Diagnosis not present

## 2019-03-27 DIAGNOSIS — I69354 Hemiplegia and hemiparesis following cerebral infarction affecting left non-dominant side: Secondary | ICD-10-CM | POA: Diagnosis not present

## 2019-03-27 DIAGNOSIS — U071 COVID-19: Secondary | ICD-10-CM | POA: Diagnosis not present

## 2019-03-27 DIAGNOSIS — M21372 Foot drop, left foot: Secondary | ICD-10-CM | POA: Diagnosis not present

## 2019-03-27 DIAGNOSIS — R5383 Other fatigue: Secondary | ICD-10-CM | POA: Diagnosis not present

## 2019-03-27 DIAGNOSIS — M4316 Spondylolisthesis, lumbar region: Secondary | ICD-10-CM | POA: Diagnosis not present

## 2019-03-31 DIAGNOSIS — M4316 Spondylolisthesis, lumbar region: Secondary | ICD-10-CM | POA: Diagnosis not present

## 2019-03-31 DIAGNOSIS — R5383 Other fatigue: Secondary | ICD-10-CM | POA: Diagnosis not present

## 2019-03-31 DIAGNOSIS — M21372 Foot drop, left foot: Secondary | ICD-10-CM | POA: Diagnosis not present

## 2019-03-31 DIAGNOSIS — M48061 Spinal stenosis, lumbar region without neurogenic claudication: Secondary | ICD-10-CM | POA: Diagnosis not present

## 2019-03-31 DIAGNOSIS — U071 COVID-19: Secondary | ICD-10-CM | POA: Diagnosis not present

## 2019-03-31 DIAGNOSIS — I69354 Hemiplegia and hemiparesis following cerebral infarction affecting left non-dominant side: Secondary | ICD-10-CM | POA: Diagnosis not present

## 2019-04-03 DIAGNOSIS — M21372 Foot drop, left foot: Secondary | ICD-10-CM | POA: Diagnosis not present

## 2019-04-03 DIAGNOSIS — M4316 Spondylolisthesis, lumbar region: Secondary | ICD-10-CM | POA: Diagnosis not present

## 2019-04-03 DIAGNOSIS — U071 COVID-19: Secondary | ICD-10-CM | POA: Diagnosis not present

## 2019-04-03 DIAGNOSIS — M48061 Spinal stenosis, lumbar region without neurogenic claudication: Secondary | ICD-10-CM | POA: Diagnosis not present

## 2019-04-03 DIAGNOSIS — I69354 Hemiplegia and hemiparesis following cerebral infarction affecting left non-dominant side: Secondary | ICD-10-CM | POA: Diagnosis not present

## 2019-04-03 DIAGNOSIS — R5383 Other fatigue: Secondary | ICD-10-CM | POA: Diagnosis not present

## 2019-04-08 DIAGNOSIS — M21372 Foot drop, left foot: Secondary | ICD-10-CM | POA: Diagnosis not present

## 2019-04-08 DIAGNOSIS — R5383 Other fatigue: Secondary | ICD-10-CM | POA: Diagnosis not present

## 2019-04-08 DIAGNOSIS — I69354 Hemiplegia and hemiparesis following cerebral infarction affecting left non-dominant side: Secondary | ICD-10-CM | POA: Diagnosis not present

## 2019-04-08 DIAGNOSIS — U071 COVID-19: Secondary | ICD-10-CM | POA: Diagnosis not present

## 2019-04-08 DIAGNOSIS — M48061 Spinal stenosis, lumbar region without neurogenic claudication: Secondary | ICD-10-CM | POA: Diagnosis not present

## 2019-04-08 DIAGNOSIS — M4316 Spondylolisthesis, lumbar region: Secondary | ICD-10-CM | POA: Diagnosis not present

## 2019-04-09 DIAGNOSIS — I10 Essential (primary) hypertension: Secondary | ICD-10-CM | POA: Diagnosis not present

## 2019-04-09 DIAGNOSIS — E1142 Type 2 diabetes mellitus with diabetic polyneuropathy: Secondary | ICD-10-CM | POA: Diagnosis not present

## 2019-04-14 DIAGNOSIS — M21372 Foot drop, left foot: Secondary | ICD-10-CM | POA: Diagnosis not present

## 2019-04-14 DIAGNOSIS — R5383 Other fatigue: Secondary | ICD-10-CM | POA: Diagnosis not present

## 2019-04-14 DIAGNOSIS — U071 COVID-19: Secondary | ICD-10-CM | POA: Diagnosis not present

## 2019-04-14 DIAGNOSIS — M48061 Spinal stenosis, lumbar region without neurogenic claudication: Secondary | ICD-10-CM | POA: Diagnosis not present

## 2019-04-14 DIAGNOSIS — M4316 Spondylolisthesis, lumbar region: Secondary | ICD-10-CM | POA: Diagnosis not present

## 2019-04-14 DIAGNOSIS — I69354 Hemiplegia and hemiparesis following cerebral infarction affecting left non-dominant side: Secondary | ICD-10-CM | POA: Diagnosis not present

## 2019-04-17 DIAGNOSIS — U071 COVID-19: Secondary | ICD-10-CM | POA: Diagnosis not present

## 2019-04-17 DIAGNOSIS — I69354 Hemiplegia and hemiparesis following cerebral infarction affecting left non-dominant side: Secondary | ICD-10-CM | POA: Diagnosis not present

## 2019-04-17 DIAGNOSIS — Z794 Long term (current) use of insulin: Secondary | ICD-10-CM | POA: Diagnosis not present

## 2019-04-17 DIAGNOSIS — M4316 Spondylolisthesis, lumbar region: Secondary | ICD-10-CM | POA: Diagnosis not present

## 2019-04-17 DIAGNOSIS — Z7901 Long term (current) use of anticoagulants: Secondary | ICD-10-CM | POA: Diagnosis not present

## 2019-04-17 DIAGNOSIS — F209 Schizophrenia, unspecified: Secondary | ICD-10-CM | POA: Diagnosis not present

## 2019-04-17 DIAGNOSIS — Z5181 Encounter for therapeutic drug level monitoring: Secondary | ICD-10-CM | POA: Diagnosis not present

## 2019-04-17 DIAGNOSIS — E1151 Type 2 diabetes mellitus with diabetic peripheral angiopathy without gangrene: Secondary | ICD-10-CM | POA: Diagnosis not present

## 2019-04-17 DIAGNOSIS — M48061 Spinal stenosis, lumbar region without neurogenic claudication: Secondary | ICD-10-CM | POA: Diagnosis not present

## 2019-04-17 DIAGNOSIS — R5383 Other fatigue: Secondary | ICD-10-CM | POA: Diagnosis not present

## 2019-04-17 DIAGNOSIS — R32 Unspecified urinary incontinence: Secondary | ICD-10-CM | POA: Diagnosis not present

## 2019-04-17 DIAGNOSIS — M21372 Foot drop, left foot: Secondary | ICD-10-CM | POA: Diagnosis not present

## 2019-04-22 ENCOUNTER — Ambulatory Visit (INDEPENDENT_AMBULATORY_CARE_PROVIDER_SITE_OTHER): Payer: Medicare Other | Admitting: *Deleted

## 2019-04-22 ENCOUNTER — Other Ambulatory Visit: Payer: Self-pay

## 2019-04-22 DIAGNOSIS — Z8679 Personal history of other diseases of the circulatory system: Secondary | ICD-10-CM | POA: Diagnosis not present

## 2019-04-22 DIAGNOSIS — I82409 Acute embolism and thrombosis of unspecified deep veins of unspecified lower extremity: Secondary | ICD-10-CM | POA: Diagnosis not present

## 2019-04-22 DIAGNOSIS — U071 COVID-19: Secondary | ICD-10-CM | POA: Diagnosis not present

## 2019-04-22 DIAGNOSIS — Z5181 Encounter for therapeutic drug level monitoring: Secondary | ICD-10-CM | POA: Diagnosis not present

## 2019-04-22 DIAGNOSIS — Z20828 Contact with and (suspected) exposure to other viral communicable diseases: Secondary | ICD-10-CM | POA: Diagnosis not present

## 2019-04-22 LAB — POCT INR: INR: 1.8 — AB (ref 2.0–3.0)

## 2019-04-22 NOTE — Patient Instructions (Signed)
Take warfarin 1 1/2 tablets tonight then resume 5mg  daily except 2.5mg  on Mondays, Wednesdays and Fridays Recheck in 4 weeks Orders written for [employer] [phone]

## 2019-04-23 DIAGNOSIS — M48061 Spinal stenosis, lumbar region without neurogenic claudication: Secondary | ICD-10-CM | POA: Diagnosis not present

## 2019-04-23 DIAGNOSIS — R5383 Other fatigue: Secondary | ICD-10-CM | POA: Diagnosis not present

## 2019-04-23 DIAGNOSIS — I69354 Hemiplegia and hemiparesis following cerebral infarction affecting left non-dominant side: Secondary | ICD-10-CM | POA: Diagnosis not present

## 2019-04-23 DIAGNOSIS — M21372 Foot drop, left foot: Secondary | ICD-10-CM | POA: Diagnosis not present

## 2019-04-23 DIAGNOSIS — M4316 Spondylolisthesis, lumbar region: Secondary | ICD-10-CM | POA: Diagnosis not present

## 2019-04-23 DIAGNOSIS — U071 COVID-19: Secondary | ICD-10-CM | POA: Diagnosis not present

## 2019-04-23 NOTE — Telephone Encounter (Signed)
Called [facility] and they said they couldn't look the pt up by her insurance cards. They will reach out to the pt to see if they are able to update the forms. They are aware that pt is in a facility. Letter received from [facility] will be scanned in pts chart.

## 2019-04-24 DIAGNOSIS — R5383 Other fatigue: Secondary | ICD-10-CM | POA: Diagnosis not present

## 2019-04-24 DIAGNOSIS — M48061 Spinal stenosis, lumbar region without neurogenic claudication: Secondary | ICD-10-CM | POA: Diagnosis not present

## 2019-04-24 DIAGNOSIS — M4316 Spondylolisthesis, lumbar region: Secondary | ICD-10-CM | POA: Diagnosis not present

## 2019-04-24 DIAGNOSIS — U071 COVID-19: Secondary | ICD-10-CM | POA: Diagnosis not present

## 2019-04-24 DIAGNOSIS — M21372 Foot drop, left foot: Secondary | ICD-10-CM | POA: Diagnosis not present

## 2019-04-24 DIAGNOSIS — I69354 Hemiplegia and hemiparesis following cerebral infarction affecting left non-dominant side: Secondary | ICD-10-CM | POA: Diagnosis not present

## 2019-04-28 DIAGNOSIS — M48061 Spinal stenosis, lumbar region without neurogenic claudication: Secondary | ICD-10-CM | POA: Diagnosis not present

## 2019-04-28 DIAGNOSIS — U071 COVID-19: Secondary | ICD-10-CM | POA: Diagnosis not present

## 2019-04-28 DIAGNOSIS — M21372 Foot drop, left foot: Secondary | ICD-10-CM | POA: Diagnosis not present

## 2019-04-28 DIAGNOSIS — R5383 Other fatigue: Secondary | ICD-10-CM | POA: Diagnosis not present

## 2019-04-28 DIAGNOSIS — M4316 Spondylolisthesis, lumbar region: Secondary | ICD-10-CM | POA: Diagnosis not present

## 2019-04-28 DIAGNOSIS — I69354 Hemiplegia and hemiparesis following cerebral infarction affecting left non-dominant side: Secondary | ICD-10-CM | POA: Diagnosis not present

## 2019-05-01 DIAGNOSIS — M4316 Spondylolisthesis, lumbar region: Secondary | ICD-10-CM | POA: Diagnosis not present

## 2019-05-01 DIAGNOSIS — M48061 Spinal stenosis, lumbar region without neurogenic claudication: Secondary | ICD-10-CM | POA: Diagnosis not present

## 2019-05-01 DIAGNOSIS — M21372 Foot drop, left foot: Secondary | ICD-10-CM | POA: Diagnosis not present

## 2019-05-01 DIAGNOSIS — U071 COVID-19: Secondary | ICD-10-CM | POA: Diagnosis not present

## 2019-05-01 DIAGNOSIS — R5383 Other fatigue: Secondary | ICD-10-CM | POA: Diagnosis not present

## 2019-05-01 DIAGNOSIS — I69354 Hemiplegia and hemiparesis following cerebral infarction affecting left non-dominant side: Secondary | ICD-10-CM | POA: Diagnosis not present

## 2019-05-05 ENCOUNTER — Other Ambulatory Visit: Payer: Self-pay | Admitting: Nurse Practitioner

## 2019-05-05 DIAGNOSIS — M48061 Spinal stenosis, lumbar region without neurogenic claudication: Secondary | ICD-10-CM | POA: Diagnosis not present

## 2019-05-05 DIAGNOSIS — K5900 Constipation, unspecified: Secondary | ICD-10-CM

## 2019-05-05 DIAGNOSIS — K625 Hemorrhage of anus and rectum: Secondary | ICD-10-CM

## 2019-05-05 DIAGNOSIS — R1031 Right lower quadrant pain: Secondary | ICD-10-CM

## 2019-05-05 DIAGNOSIS — R1032 Left lower quadrant pain: Secondary | ICD-10-CM

## 2019-05-05 DIAGNOSIS — R5383 Other fatigue: Secondary | ICD-10-CM | POA: Diagnosis not present

## 2019-05-05 DIAGNOSIS — Z20828 Contact with and (suspected) exposure to other viral communicable diseases: Secondary | ICD-10-CM | POA: Diagnosis not present

## 2019-05-05 DIAGNOSIS — M21372 Foot drop, left foot: Secondary | ICD-10-CM | POA: Diagnosis not present

## 2019-05-05 DIAGNOSIS — U071 COVID-19: Secondary | ICD-10-CM | POA: Diagnosis not present

## 2019-05-05 DIAGNOSIS — I69354 Hemiplegia and hemiparesis following cerebral infarction affecting left non-dominant side: Secondary | ICD-10-CM | POA: Diagnosis not present

## 2019-05-05 DIAGNOSIS — M4316 Spondylolisthesis, lumbar region: Secondary | ICD-10-CM | POA: Diagnosis not present

## 2019-05-10 DIAGNOSIS — K219 Gastro-esophageal reflux disease without esophagitis: Secondary | ICD-10-CM | POA: Diagnosis not present

## 2019-05-10 DIAGNOSIS — E1142 Type 2 diabetes mellitus with diabetic polyneuropathy: Secondary | ICD-10-CM | POA: Diagnosis not present

## 2019-05-12 DIAGNOSIS — M48061 Spinal stenosis, lumbar region without neurogenic claudication: Secondary | ICD-10-CM | POA: Diagnosis not present

## 2019-05-12 DIAGNOSIS — U071 COVID-19: Secondary | ICD-10-CM | POA: Diagnosis not present

## 2019-05-12 DIAGNOSIS — Z20828 Contact with and (suspected) exposure to other viral communicable diseases: Secondary | ICD-10-CM | POA: Diagnosis not present

## 2019-05-12 DIAGNOSIS — M4316 Spondylolisthesis, lumbar region: Secondary | ICD-10-CM | POA: Diagnosis not present

## 2019-05-12 DIAGNOSIS — I69354 Hemiplegia and hemiparesis following cerebral infarction affecting left non-dominant side: Secondary | ICD-10-CM | POA: Diagnosis not present

## 2019-05-12 DIAGNOSIS — M21372 Foot drop, left foot: Secondary | ICD-10-CM | POA: Diagnosis not present

## 2019-05-12 DIAGNOSIS — R5383 Other fatigue: Secondary | ICD-10-CM | POA: Diagnosis not present

## 2019-05-13 DIAGNOSIS — U071 COVID-19: Secondary | ICD-10-CM | POA: Diagnosis not present

## 2019-05-13 DIAGNOSIS — M79674 Pain in right toe(s): Secondary | ICD-10-CM | POA: Diagnosis not present

## 2019-05-13 DIAGNOSIS — M79675 Pain in left toe(s): Secondary | ICD-10-CM | POA: Diagnosis not present

## 2019-05-13 DIAGNOSIS — B351 Tinea unguium: Secondary | ICD-10-CM | POA: Diagnosis not present

## 2019-05-13 DIAGNOSIS — Z20828 Contact with and (suspected) exposure to other viral communicable diseases: Secondary | ICD-10-CM | POA: Diagnosis not present

## 2019-05-14 NOTE — Progress Notes (Signed)
Virtual Visit via Telephone Note  I connected with [name] on 05/22/19 at  8:40 AM EDT by telephone and verified that I am speaking with the correct person using two identifiers.   I discussed the limitations, risks, security and privacy concerns of performing an evaluation and management service by telephone and the availability of in person appointments. I also discussed with the patient that there may be a patient responsible charge related to this service. The patient expressed understanding and agreed to proceed.      I discussed the assessment and treatment plan with the patient. The patient was provided an opportunity to ask questions and all were answered. The patient agreed with the plan and demonstrated an understanding of the instructions.   The patient was advised to call back or seek an in-person evaluation if the symptoms worsen or if the condition fails to improve as anticipated.  I provided 21 minutes of non-face-to-face time during this encounter.   [name], MD    [facility] MD/PA/NP OP Progress Note  05/22/2019 9:46 AM [name]  MRN:  [id number]  Chief Complaint:  Chief Complaint    Follow-up; Schizophrenia     HPI:  This is a follow-up appointment for schizophrenia.  She states that she has been doing very well.  There is nothing to do during the day.  However on further elaboration, she states that she enjoys watching TV.  She has occasional headache, and she takes Tylenol.  She feels bored as she has not been able to meet her family due to pandemic.  She denies insomnia.  She reports appetite loss.  She denies feeling depressed or anxiety.  She denies SI.  She denies paranoia.  She denies hallucinations.   [name], staff at [facility] presents to the interview There has been no significant change since the last visit.  Although she is a picky eater, she eats some food, and snacks and yogurt. Her daughter and granddaughter comes to the facility while  there are not able to meet in person. No safety concern or behavioral concern.   According to [name], the patient has been on mirtazapine at least since this February. [name] agrees to discuss with Dr. Legrand Rams to see whether this medication can be tapered off .  Visit Diagnosis:    ICD-10-CM   1. Schizophrenia, unspecified type ([place])  F20.9 ABILIFY 2 MG tablet    risperiDONE (RISPERDAL) 1 MG tablet    zolpidem (AMBIEN) 10 MG tablet    Past Psychiatric History: Please see initial evaluation for full details. I have reviewed the history. No updates at this time.     Past Medical History:  Past Medical History:  Diagnosis Date  . Anxiety   . Arthritis   . COVID-19 virus infection    COVID-19+ approx 01/24/19; asymptomatic course with full recovery  . Dependence on wheelchair    pivot/transfers  . Depression    History of psychosis and previous suicide attempt  . DVT, lower extremity, recurrent (HCC)    Long-term Coumadin per Dr. Legrand Rams  . Essential hypertension   . GERD (gastroesophageal reflux disease)   . Hemiplegia ([place]) 2010   Left side  . History of stroke    Acute infarct and right cerebral white matter small vessel disease 12/10  . Leg DVT (deep venous thromboembolism), acute ([place]) 2006  . Schizophrenia ([place])   . Stroke [facility])    left sided weakness  . Type 2 diabetes mellitus ([place])  Past Surgical History:  Procedure Laterality Date  . BACK SURGERY    . BIOPSY N/A 11/24/2014   Procedure: BIOPSY;  Surgeon: Danie Binder, MD;  Location: [facility] ORS;  Service: Endoscopy;  Laterality: N/A;  . BIOPSY  05/17/2016   Procedure: BIOPSY;  Surgeon: Danie Binder, MD;  Location: [facility] END[facility];  Service: Endoscopy;;  gastric biopsy  . COLONOSCOPY WITH PROPOFOL N/A 11/24/2014   Dr. Rudie Meyer polyps removed/moderate sized internal hemorrhoids, tubular adenomas. Next surveillance in 3 years  . ESOPHAGOGASTRODUODENOSCOPY (EGD) WITH PROPOFOL N/A 11/24/2014   Dr. Clayburn Pert HH/patent  stricture at the gastroesophageal junction, mild non-erosive gastritis, path negative for H.pylori or celiac sprue  . ESOPHAGOGASTRODUODENOSCOPY (EGD) WITH PROPOFOL N/A 05/17/2016   Procedure: ESOPHAGOGASTRODUODENOSCOPY (EGD) WITH PROPOFOL;  Surgeon: Danie Binder, MD;  Location: [facility];  Service: Endoscopy;  Laterality: N/A;  12:45pm  . GIVENS CAPSULE STUDY N/A 12/11/2014   MULTILPLE EROSION IN the stomach WITH ACTIVE OOZING. OCCASIONAL EROSIONS AND RARE ULCER SEEN IN PROXIMAL SMALL BOWEL . No masses or AVMs SEEN. NO OLD BLOOD OR FRESH BLOOD SEEN.   . POLYPECTOMY N/A 11/24/2014   Procedure: POLYPECTOMY;  Surgeon: Danie Binder, MD;  Location: [facility];  Service: Endoscopy;  Laterality: N/A;  . SAVORY DILATION N/A 05/17/2016   Procedure: SAVORY DILATION;  Surgeon: Danie Binder, MD;  Location: [facility];  Service: Endoscopy;  Laterality: N/A;    Family Psychiatric History: Please see initial evaluation for full details. I have reviewed the history. No updates at this time.     Family History:  Family History  Problem Relation Age of Onset  . Hypertension Mother   . Colon cancer Neg Hx     Social History:  Social History   Socioeconomic History  . Marital status: Widowed    Spouse name: Not on file  . Number of children: Not on file  . Years of education: Not on file  . Highest education level: Not on file  Occupational History  . Not on file  Tobacco Use  . Smoking status: Former Smoker    Packs/day: 0.25    Years: 20.00    Pack years: 5.00    Types: Cigarettes    Quit date: 01/24/1995    Years since quitting: 24.3  . Smokeless tobacco: Never Used  Substance and Sexual Activity  . Alcohol use: No    Alcohol/week: 0.0 standard drinks  . Drug use: No  . Sexual activity: Never    Birth control/protection: Post-menopausal  Other Topics Concern  . Not on file  Social History Narrative  . Not on file   Social Determinants of Health   Financial Resource Strain:    . Difficulty of Paying Living Expenses:   Food Insecurity:   . Worried About Charity fundraiser in the Last Year:   . Arboriculturist in the Last Year:   Transportation Needs:   . Film/video editor (Medical):   Marland Kitchen Lack of Transportation (Non-Medical):   Physical Activity:   . Days of Exercise per Week:   . Minutes of Exercise per Session:   Stress:   . Feeling of Stress :   Social Connections:   . Frequency of Communication with Friends and Family:   . Frequency of Social Gatherings with Friends and Family:   . Attends Religious Services:   . Active Member of Clubs or Organizations:   . Attends Archivist Meetings:   Marland Kitchen Marital Status:  Allergies:  Allergies  Allergen Reactions  . Sulfa Antibiotics Rash  . Sulfonamide Derivatives Rash    REACTION: rash    Metabolic Disorder Labs: Lab Results  Component Value Date   HGBA1C 7.2 (H) 06/27/2018   MPG 160 06/27/2018   MPG 134 03/15/2018   No results found for: PROLACTIN Lab Results  Component Value Date   CHOL 133 10/07/2016   TRIG 252 (H) 10/07/2016   HDL 28 (L) 10/07/2016   CHOLHDL 4.8 10/07/2016   VLDL 50 (H) 10/07/2016   LDLCALC 55 10/07/2016   LDLCALC (H) 01/03/2009    143        Total Cholesterol/HDL:CHD Risk Coronary Heart Disease Risk Table                     Men   Women  1/2 Average Risk   3.4   3.3  Average Risk       5.0   4.4  2 X Average Risk   9.6   7.1  3 X Average Risk  23.4   11.0        Use the calculated Patient Ratio above and the CHD Risk Table to determine the patient's CHD Risk.        ATP III CLASSIFICATION (LDL):  <100     mg/dL   Optimal  100-129  mg/dL   Near or Above                    Optimal  130-159  mg/dL   Borderline  160-189  mg/dL   High  >190     mg/dL   Very High   Lab Results  Component Value Date   TSH 1.138 03/29/2017   TSH 0.538 02/13/2016    Therapeutic Level Labs: No results found for: LITHIUM No results found for: VALPROATE No  components found for:  CBMZ  Current Medications: Current Outpatient Medications  Medication Sig Dispense Refill  . ABILIFY 2 MG tablet Take 1 tablet (2 mg total) by mouth daily. 28 tablet 2  . acetaminophen (TYLENOL) 325 MG tablet Take 650 mg by mouth 3 (three) times daily as needed.    . calcium carbonate (TUMS - DOSED IN MG ELEMENTAL CALCIUM) 500 MG chewable tablet Chew 1 tablet by mouth 4 (four) times daily as needed for indigestion or heartburn.     . cholecalciferol (VITAMIN D) 1000 units tablet Take 2,000 Units by mouth daily.    . clonazePAM (KLONOPIN) 0.5 MG tablet Take 1 tablet (0.5 mg total) by mouth 3 (three) times daily. 90 tablet 2  . docusate sodium (COLACE) 100 MG capsule Take 200 mg by mouth at bedtime.     . DULoxetine (CYMBALTA) 60 MG capsule Take 120 mg by mouth daily.     Marland Kitchen gabapentin (NEURONTIN) 300 MG capsule Take 300 mg by mouth 3 (three) times daily.    Marland Kitchen HYDROcodone-acetaminophen (NORCO) 10-325 MG tablet Take 1 tablet by mouth every 8 (eight) hours as needed.     . hydrocortisone (ANUSOL-HC) 2.5 % rectal cream Place 1 application rectally 2 (two) times daily as needed.     . iron polysaccharides (NIFEREX) 150 MG capsule Take 1 capsule (150 mg total) by mouth daily. 30 capsule 11  . latanoprost (XALATAN) 0.005 % ophthalmic solution Place 1 drop into both eyes at bedtime.    Marland Kitchen LEVEMIR FLEXTOUCH 100 UNIT/ML Pen INJECT 28 UNITS INTO THE SKIN AT BEDTIME. 15 mL 3  . loratadine (  CLARITIN) 10 MG tablet Take 10 mg by mouth daily.    Marland Kitchen lubiprostone (AMITIZA) 24 MCG capsule TAKE 1 CAPSULE BY MOUTH 2 TIMES A DAY. 60 capsule 5  . metoprolol succinate (TOPROL-XL) 25 MG 24 hr tablet Take 0.5 tablets (12.5 mg total) by mouth daily. (Patient taking differently: Take 12.5 mg by mouth 2 (two) times daily. )    . midodrine (PROAMATINE) 2.5 MG tablet Take 5 mg by mouth 3 (three) times daily with meals.     . mirabegron ER (MYRBETRIQ) 25 MG TB24 tablet Take 25 mg by mouth daily.    .  mirtazapine (REMERON) 30 MG tablet Take 30 mg by mouth at bedtime.    . ondansetron (ZOFRAN) 4 MG tablet Take 1 tablet (4 mg total) by mouth 4 (four) times daily -  before meals and at bedtime. 120 tablet 3  . pantoprazole (PROTONIX) 40 MG tablet Take 40 mg by mouth daily.    . Prucalopride Succinate (MOTEGRITY) 2 MG TABS Take 1 tablet (2 mg total) by mouth daily. 30 tablet 5  . risperiDONE (RISPERDAL) 1 MG tablet Take 1 tablet (1 mg total) by mouth at bedtime. 28 tablet 2  . simvastatin (ZOCOR) 20 MG tablet Take 20 mg by mouth at bedtime.     Marland Kitchen tiZANidine (ZANAFLEX) 2 MG tablet Take 2 mg by mouth daily.    Marland Kitchen warfarin (COUMADIN) 5 MG tablet Take 1 tablet tonight then increase dose to 1 tablet daily except 1/2 tablet on Mondays, Wednesdays and Fridays 26 tablet 6  . [START ON 06/04/2019] zolpidem (AMBIEN) 10 MG tablet Take 1 tablet (10 mg total) by mouth at bedtime. Please hold this medication if she has drowsiness 30 tablet 2   No current facility-administered medications for this visit.     Musculoskeletal: Strength & Muscle Tone: N/A Gait & Station: N/A Patient leans: N/A  Psychiatric Specialty Exam: Review of Systems  Psychiatric/Behavioral: Negative for agitation, behavioral problems, confusion, decreased concentration, dysphoric mood, hallucinations, self-injury, sleep disturbance and suicidal ideas. The patient is not nervous/anxious and is not hyperactive.   All other systems reviewed and are negative.   There were no vitals taken for this visit.There is no height or weight on file to calculate BMI.  General Appearance: NA  Eye Contact:  NA  Speech:  Slow  Volume:  Normal  Mood:  good  Affect:  NA  Thought Process:  Coherent  Orientation:  Full (Time, Place, and Person)  Thought Content: Logical   Suicidal Thoughts:  No  Homicidal Thoughts:  No  Memory:  Immediate;   Good  Judgement:  Fair  Insight:  Present  Psychomotor Activity:  Normal  Concentration:  Concentration:  Good and Attention Span: Good  Recall:  Good  Fund of Knowledge: Good  Language: Good  Akathisia:  No  Handed:  Right  AIMS (if indicated): not done  Assets:  Social Support  ADL's:  Intact  Cognition: WNL  Sleep:  Good   Screenings: PHQ2-9     Office Visit from 11/28/2017 in [facility] Office Visit from 09/25/2017 in [facility] Office Visit from 03/28/2017 in [facility] Office Visit from 09/13/2016 in [facility] Office Visit from 06/07/2016 in [facility]  PHQ-2 Total Score  0  0  0  0  0       Assessment and Plan:  [name] is a 74 y.o. year old female with a history of schizophrenia, depression, SVT,  DVT, history of stroke on warfarin, type II diabetes, hypertension, GERD , who presents for follow up appointment for Schizophrenia, unspecified type ([place]) - Plan: ABILIFY 2 MG tablet, risperiDONE (RISPERDAL) 1 MG tablet, zolpidem (AMBIEN) 10 MG tablet  # Schizophrenia by history # MDD She continues to demonstrate linear thought process, and is calm through the entire interview, which has been consistent since initial evaluation.  There has been no known psychotic episode or behavior issues.  We will continue Risperdal and Abilify for schizophrenia.  Discussed potential risk of EPS and metabolic side effect.  Noted that although it is preferable to do monotherapy, she had worsening in withdrawal symptoms in the context of tapering off Risperdal.  Will continue Ambien as needed for insomnia.  We will plan to taper it off in the future to avoid its potential risk of fall.  Of note, she is on duloxetine and mirtazapine was started by her PCP.  The staff is advised to discuss with her PCP whether mirtazapine can be discontinued to avoid polypharmacy.   Plan 1. ContinueAbilify2 mg daily 2.Continue Risperidone 1 mg at night(worsening indecreased  appetite/withdrawnwhen tapering off this medication) 3.ContinueZolpidem 10 mg at night - on clonazepam 0.5 mg three timesa day for anxiety (prescribed by Dr. Merlene Laughter) - on on mirtazapine 30 mg at night, prescribed by Dr. Legrand Rams - on duloxetine 120 mg daily(prescribed by Dr. Legrand Rams.)  4.Next appointment: 7/27 at 10 AM for 20 mins phone - Fax AVS to [phone] 5. Obtain record from Dr. Peggye Form- pending -on gabapentin 300 mg TID   The patient demonstrates the following risk factors for suicide: Chronic risk factors for suicide include:psychiatric disorder ofschizophrenia by history, depression. Acute risk factorsfor suicide include: unemployment. Protective factorsfor this patient include: positive social support and hope for the future. Considering these factors, the overall suicide risk at this point appears to below. Patientisappropriate for outpatient follow up.   [name], MD 05/22/2019, 9:46 AM

## 2019-05-17 DIAGNOSIS — M21372 Foot drop, left foot: Secondary | ICD-10-CM | POA: Diagnosis not present

## 2019-05-17 DIAGNOSIS — Z794 Long term (current) use of insulin: Secondary | ICD-10-CM | POA: Diagnosis not present

## 2019-05-17 DIAGNOSIS — F209 Schizophrenia, unspecified: Secondary | ICD-10-CM | POA: Diagnosis not present

## 2019-05-17 DIAGNOSIS — R5383 Other fatigue: Secondary | ICD-10-CM | POA: Diagnosis not present

## 2019-05-17 DIAGNOSIS — M4316 Spondylolisthesis, lumbar region: Secondary | ICD-10-CM | POA: Diagnosis not present

## 2019-05-17 DIAGNOSIS — I69354 Hemiplegia and hemiparesis following cerebral infarction affecting left non-dominant side: Secondary | ICD-10-CM | POA: Diagnosis not present

## 2019-05-17 DIAGNOSIS — Z5181 Encounter for therapeutic drug level monitoring: Secondary | ICD-10-CM | POA: Diagnosis not present

## 2019-05-17 DIAGNOSIS — E1151 Type 2 diabetes mellitus with diabetic peripheral angiopathy without gangrene: Secondary | ICD-10-CM | POA: Diagnosis not present

## 2019-05-17 DIAGNOSIS — Z7901 Long term (current) use of anticoagulants: Secondary | ICD-10-CM | POA: Diagnosis not present

## 2019-05-17 DIAGNOSIS — M48061 Spinal stenosis, lumbar region without neurogenic claudication: Secondary | ICD-10-CM | POA: Diagnosis not present

## 2019-05-17 DIAGNOSIS — R32 Unspecified urinary incontinence: Secondary | ICD-10-CM | POA: Diagnosis not present

## 2019-05-17 DIAGNOSIS — U071 COVID-19: Secondary | ICD-10-CM | POA: Diagnosis not present

## 2019-05-19 DIAGNOSIS — M4316 Spondylolisthesis, lumbar region: Secondary | ICD-10-CM | POA: Diagnosis not present

## 2019-05-19 DIAGNOSIS — U071 COVID-19: Secondary | ICD-10-CM | POA: Diagnosis not present

## 2019-05-19 DIAGNOSIS — M21372 Foot drop, left foot: Secondary | ICD-10-CM | POA: Diagnosis not present

## 2019-05-19 DIAGNOSIS — M48061 Spinal stenosis, lumbar region without neurogenic claudication: Secondary | ICD-10-CM | POA: Diagnosis not present

## 2019-05-19 DIAGNOSIS — I69354 Hemiplegia and hemiparesis following cerebral infarction affecting left non-dominant side: Secondary | ICD-10-CM | POA: Diagnosis not present

## 2019-05-19 DIAGNOSIS — R5383 Other fatigue: Secondary | ICD-10-CM | POA: Diagnosis not present

## 2019-05-21 DIAGNOSIS — F419 Anxiety disorder, unspecified: Secondary | ICD-10-CM | POA: Diagnosis not present

## 2019-05-21 DIAGNOSIS — G894 Chronic pain syndrome: Secondary | ICD-10-CM | POA: Diagnosis not present

## 2019-05-21 DIAGNOSIS — U071 COVID-19: Secondary | ICD-10-CM | POA: Diagnosis not present

## 2019-05-21 DIAGNOSIS — M545 Low back pain: Secondary | ICD-10-CM | POA: Diagnosis not present

## 2019-05-21 DIAGNOSIS — M21372 Foot drop, left foot: Secondary | ICD-10-CM | POA: Diagnosis not present

## 2019-05-21 DIAGNOSIS — R5383 Other fatigue: Secondary | ICD-10-CM | POA: Diagnosis not present

## 2019-05-21 DIAGNOSIS — M961 Postlaminectomy syndrome, not elsewhere classified: Secondary | ICD-10-CM | POA: Diagnosis not present

## 2019-05-21 DIAGNOSIS — M48061 Spinal stenosis, lumbar region without neurogenic claudication: Secondary | ICD-10-CM | POA: Diagnosis not present

## 2019-05-21 DIAGNOSIS — Z79891 Long term (current) use of opiate analgesic: Secondary | ICD-10-CM | POA: Diagnosis not present

## 2019-05-21 DIAGNOSIS — I69354 Hemiplegia and hemiparesis following cerebral infarction affecting left non-dominant side: Secondary | ICD-10-CM | POA: Diagnosis not present

## 2019-05-21 DIAGNOSIS — M4316 Spondylolisthesis, lumbar region: Secondary | ICD-10-CM | POA: Diagnosis not present

## 2019-05-22 ENCOUNTER — Other Ambulatory Visit: Payer: Self-pay

## 2019-05-22 ENCOUNTER — Encounter (HOSPITAL_COMMUNITY): Payer: Self-pay | Admitting: Psychiatry

## 2019-05-22 ENCOUNTER — Ambulatory Visit (INDEPENDENT_AMBULATORY_CARE_PROVIDER_SITE_OTHER): Payer: Medicare Other

## 2019-05-22 ENCOUNTER — Telehealth (INDEPENDENT_AMBULATORY_CARE_PROVIDER_SITE_OTHER): Payer: Medicare Other | Admitting: Psychiatry

## 2019-05-22 DIAGNOSIS — F209 Schizophrenia, unspecified: Secondary | ICD-10-CM | POA: Diagnosis not present

## 2019-05-22 DIAGNOSIS — Z8679 Personal history of other diseases of the circulatory system: Secondary | ICD-10-CM | POA: Diagnosis not present

## 2019-05-22 DIAGNOSIS — I82409 Acute embolism and thrombosis of unspecified deep veins of unspecified lower extremity: Secondary | ICD-10-CM | POA: Diagnosis not present

## 2019-05-22 DIAGNOSIS — Z5181 Encounter for therapeutic drug level monitoring: Secondary | ICD-10-CM | POA: Diagnosis not present

## 2019-05-22 LAB — POCT INR: INR: 1.6 — AB (ref 2.0–3.0)

## 2019-05-22 MED ORDER — ABILIFY 2 MG PO TABS: 2.0000 mg | ORAL_TABLET | Freq: Every day | ORAL | 2 refills | Status: DC

## 2019-05-22 MED ORDER — RISPERIDONE 1 MG PO TABS: 1.0000 mg | ORAL_TABLET | Freq: Every day | ORAL | 2 refills | Status: DC

## 2019-05-22 MED ORDER — ZOLPIDEM TARTRATE 10 MG PO TABS: 10.0000 mg | ORAL_TABLET | Freq: Every day | ORAL | 2 refills | Status: DC

## 2019-05-22 NOTE — Patient Instructions (Addendum)
1. ContinueAbilify2 mg daily 2.Continue Risperidone 1 mg at night 3.ContinueZolpidem 10 mg at night 4. Please discuss with Dr. Legrand Rams whether mirtazapine can be tapered off to avoid polypharmacy 4.Next appointment: 7/27 at 10 AM - Fax AVS to [phone]

## 2019-05-22 NOTE — Patient Instructions (Signed)
Description   Take warfarin 1.5 tablets (7.5mg ) tonight then start taking 5mg  daily except 2.5mg  on Mondays and Fridays Recheck in 2 weeks Orders written for [facility] [phone]

## 2019-05-26 DIAGNOSIS — R5383 Other fatigue: Secondary | ICD-10-CM | POA: Diagnosis not present

## 2019-05-26 DIAGNOSIS — M4316 Spondylolisthesis, lumbar region: Secondary | ICD-10-CM | POA: Diagnosis not present

## 2019-05-26 DIAGNOSIS — U071 COVID-19: Secondary | ICD-10-CM | POA: Diagnosis not present

## 2019-05-26 DIAGNOSIS — I69354 Hemiplegia and hemiparesis following cerebral infarction affecting left non-dominant side: Secondary | ICD-10-CM | POA: Diagnosis not present

## 2019-05-26 DIAGNOSIS — M21372 Foot drop, left foot: Secondary | ICD-10-CM | POA: Diagnosis not present

## 2019-05-26 DIAGNOSIS — M48061 Spinal stenosis, lumbar region without neurogenic claudication: Secondary | ICD-10-CM | POA: Diagnosis not present

## 2019-06-02 DIAGNOSIS — U071 COVID-19: Secondary | ICD-10-CM | POA: Diagnosis not present

## 2019-06-02 DIAGNOSIS — R5383 Other fatigue: Secondary | ICD-10-CM | POA: Diagnosis not present

## 2019-06-02 DIAGNOSIS — M48061 Spinal stenosis, lumbar region without neurogenic claudication: Secondary | ICD-10-CM | POA: Diagnosis not present

## 2019-06-02 DIAGNOSIS — M4316 Spondylolisthesis, lumbar region: Secondary | ICD-10-CM | POA: Diagnosis not present

## 2019-06-02 DIAGNOSIS — I69354 Hemiplegia and hemiparesis following cerebral infarction affecting left non-dominant side: Secondary | ICD-10-CM | POA: Diagnosis not present

## 2019-06-02 DIAGNOSIS — M21372 Foot drop, left foot: Secondary | ICD-10-CM | POA: Diagnosis not present

## 2019-06-04 ENCOUNTER — Telehealth: Payer: Self-pay | Admitting: *Deleted

## 2019-06-04 DIAGNOSIS — M4316 Spondylolisthesis, lumbar region: Secondary | ICD-10-CM | POA: Diagnosis not present

## 2019-06-04 DIAGNOSIS — U071 COVID-19: Secondary | ICD-10-CM | POA: Diagnosis not present

## 2019-06-04 DIAGNOSIS — R5383 Other fatigue: Secondary | ICD-10-CM | POA: Diagnosis not present

## 2019-06-04 DIAGNOSIS — M48061 Spinal stenosis, lumbar region without neurogenic claudication: Secondary | ICD-10-CM | POA: Diagnosis not present

## 2019-06-04 DIAGNOSIS — M21372 Foot drop, left foot: Secondary | ICD-10-CM | POA: Diagnosis not present

## 2019-06-04 DIAGNOSIS — I69354 Hemiplegia and hemiparesis following cerebral infarction affecting left non-dominant side: Secondary | ICD-10-CM | POA: Diagnosis not present

## 2019-06-05 ENCOUNTER — Ambulatory Visit (INDEPENDENT_AMBULATORY_CARE_PROVIDER_SITE_OTHER): Payer: Medicare Other | Admitting: *Deleted

## 2019-06-05 ENCOUNTER — Other Ambulatory Visit: Payer: Self-pay

## 2019-06-05 DIAGNOSIS — I82409 Acute embolism and thrombosis of unspecified deep veins of unspecified lower extremity: Secondary | ICD-10-CM

## 2019-06-05 DIAGNOSIS — Z5181 Encounter for therapeutic drug level monitoring: Secondary | ICD-10-CM | POA: Diagnosis not present

## 2019-06-05 DIAGNOSIS — Z8679 Personal history of other diseases of the circulatory system: Secondary | ICD-10-CM

## 2019-06-05 LAB — POCT INR: INR: 1.5 — AB (ref 2.0–3.0)

## 2019-06-05 NOTE — Patient Instructions (Signed)
Take warfarin 1.5 tablets (7.5mg ) tonight then increase dose to 1 tablet daily Recheck in 2 weeks Orders written for [facility] [phone]

## 2019-06-09 DIAGNOSIS — M48061 Spinal stenosis, lumbar region without neurogenic claudication: Secondary | ICD-10-CM | POA: Diagnosis not present

## 2019-06-09 DIAGNOSIS — R5383 Other fatigue: Secondary | ICD-10-CM | POA: Diagnosis not present

## 2019-06-09 DIAGNOSIS — M21372 Foot drop, left foot: Secondary | ICD-10-CM | POA: Diagnosis not present

## 2019-06-09 DIAGNOSIS — U071 COVID-19: Secondary | ICD-10-CM | POA: Diagnosis not present

## 2019-06-09 DIAGNOSIS — M4316 Spondylolisthesis, lumbar region: Secondary | ICD-10-CM | POA: Diagnosis not present

## 2019-06-09 DIAGNOSIS — I69354 Hemiplegia and hemiparesis following cerebral infarction affecting left non-dominant side: Secondary | ICD-10-CM | POA: Diagnosis not present

## 2019-06-09 DIAGNOSIS — I10 Essential (primary) hypertension: Secondary | ICD-10-CM | POA: Diagnosis not present

## 2019-06-09 DIAGNOSIS — E1142 Type 2 diabetes mellitus with diabetic polyneuropathy: Secondary | ICD-10-CM | POA: Diagnosis not present

## 2019-06-15 DIAGNOSIS — U071 COVID-19: Secondary | ICD-10-CM | POA: Diagnosis not present

## 2019-06-15 DIAGNOSIS — R5383 Other fatigue: Secondary | ICD-10-CM | POA: Diagnosis not present

## 2019-06-15 DIAGNOSIS — M21372 Foot drop, left foot: Secondary | ICD-10-CM | POA: Diagnosis not present

## 2019-06-15 DIAGNOSIS — M48061 Spinal stenosis, lumbar region without neurogenic claudication: Secondary | ICD-10-CM | POA: Diagnosis not present

## 2019-06-15 DIAGNOSIS — M4316 Spondylolisthesis, lumbar region: Secondary | ICD-10-CM | POA: Diagnosis not present

## 2019-06-15 DIAGNOSIS — I69354 Hemiplegia and hemiparesis following cerebral infarction affecting left non-dominant side: Secondary | ICD-10-CM | POA: Diagnosis not present

## 2019-06-16 DIAGNOSIS — U071 COVID-19: Secondary | ICD-10-CM | POA: Diagnosis not present

## 2019-06-18 ENCOUNTER — Other Ambulatory Visit: Payer: Self-pay

## 2019-06-18 ENCOUNTER — Ambulatory Visit (INDEPENDENT_AMBULATORY_CARE_PROVIDER_SITE_OTHER): Payer: Medicaid Other | Admitting: *Deleted

## 2019-06-18 DIAGNOSIS — I82409 Acute embolism and thrombosis of unspecified deep veins of unspecified lower extremity: Secondary | ICD-10-CM

## 2019-06-18 DIAGNOSIS — Z8679 Personal history of other diseases of the circulatory system: Secondary | ICD-10-CM | POA: Diagnosis not present

## 2019-06-18 DIAGNOSIS — Z5181 Encounter for therapeutic drug level monitoring: Secondary | ICD-10-CM

## 2019-06-18 LAB — POCT INR: INR: 2.4 (ref 2.0–3.0)

## 2019-06-18 NOTE — Patient Instructions (Signed)
Continue warfarin 1 tablet daily Recheck in 4 weeks Orders written for [facility] [phone]

## 2019-06-30 DIAGNOSIS — E1142 Type 2 diabetes mellitus with diabetic polyneuropathy: Secondary | ICD-10-CM | POA: Diagnosis not present

## 2019-06-30 DIAGNOSIS — L03031 Cellulitis of right toe: Secondary | ICD-10-CM | POA: Diagnosis not present

## 2019-06-30 DIAGNOSIS — I10 Essential (primary) hypertension: Secondary | ICD-10-CM | POA: Diagnosis not present

## 2019-07-01 ENCOUNTER — Telehealth: Payer: Self-pay | Admitting: Emergency Medicine

## 2019-07-01 ENCOUNTER — Other Ambulatory Visit: Payer: Self-pay | Admitting: Gastroenterology

## 2019-07-01 DIAGNOSIS — K5900 Constipation, unspecified: Secondary | ICD-10-CM

## 2019-07-01 DIAGNOSIS — R1032 Left lower quadrant pain: Secondary | ICD-10-CM

## 2019-07-01 DIAGNOSIS — K625 Hemorrhage of anus and rectum: Secondary | ICD-10-CM

## 2019-07-01 DIAGNOSIS — R1031 Right lower quadrant pain: Secondary | ICD-10-CM

## 2019-07-01 NOTE — Telephone Encounter (Signed)
Pt I srequesting rx refill on lubiprostone 41mcg 60 tabs 30 day supply [facility]

## 2019-07-01 NOTE — Progress Notes (Signed)
Was going to send Rx for Amitiza. Will need to verify which pharmacy prior to sending.

## 2019-07-01 NOTE — Telephone Encounter (Signed)
What pharmacy does this need to be sent to?

## 2019-07-02 ENCOUNTER — Other Ambulatory Visit: Payer: Self-pay | Admitting: Gastroenterology

## 2019-07-02 DIAGNOSIS — R1032 Left lower quadrant pain: Secondary | ICD-10-CM

## 2019-07-02 DIAGNOSIS — K5900 Constipation, unspecified: Secondary | ICD-10-CM

## 2019-07-02 DIAGNOSIS — K625 Hemorrhage of anus and rectum: Secondary | ICD-10-CM

## 2019-07-02 DIAGNOSIS — R1031 Right lower quadrant pain: Secondary | ICD-10-CM

## 2019-07-02 MED ORDER — LUBIPROSTONE 24 MCG PO CAPS: 24.0000 ug | ORAL_CAPSULE | Freq: Two times a day (BID) | ORAL | 5 refills | Status: DC

## 2019-07-02 NOTE — Telephone Encounter (Signed)
Please [facility]ill [facility]

## 2019-07-02 NOTE — Telephone Encounter (Signed)
Rx sent 

## 2019-07-03 DIAGNOSIS — M79674 Pain in right toe(s): Secondary | ICD-10-CM | POA: Diagnosis not present

## 2019-07-03 DIAGNOSIS — M79675 Pain in left toe(s): Secondary | ICD-10-CM | POA: Diagnosis not present

## 2019-07-03 DIAGNOSIS — L03031 Cellulitis of right toe: Secondary | ICD-10-CM | POA: Diagnosis not present

## 2019-07-03 DIAGNOSIS — E1140 Type 2 diabetes mellitus with diabetic neuropathy, unspecified: Secondary | ICD-10-CM | POA: Diagnosis not present

## 2019-07-03 DIAGNOSIS — M79671 Pain in right foot: Secondary | ICD-10-CM | POA: Diagnosis not present

## 2019-07-03 DIAGNOSIS — L600 Ingrowing nail: Secondary | ICD-10-CM | POA: Diagnosis not present

## 2019-07-03 DIAGNOSIS — M79672 Pain in left foot: Secondary | ICD-10-CM | POA: Diagnosis not present

## 2019-07-16 ENCOUNTER — Ambulatory Visit (INDEPENDENT_AMBULATORY_CARE_PROVIDER_SITE_OTHER): Payer: Medicaid Other | Admitting: *Deleted

## 2019-07-16 ENCOUNTER — Other Ambulatory Visit: Payer: Self-pay

## 2019-07-16 DIAGNOSIS — Z8679 Personal history of other diseases of the circulatory system: Secondary | ICD-10-CM | POA: Diagnosis not present

## 2019-07-16 DIAGNOSIS — M48061 Spinal stenosis, lumbar region without neurogenic claudication: Secondary | ICD-10-CM | POA: Diagnosis not present

## 2019-07-16 DIAGNOSIS — E1151 Type 2 diabetes mellitus with diabetic peripheral angiopathy without gangrene: Secondary | ICD-10-CM | POA: Diagnosis not present

## 2019-07-16 DIAGNOSIS — I82409 Acute embolism and thrombosis of unspecified deep veins of unspecified lower extremity: Secondary | ICD-10-CM | POA: Diagnosis not present

## 2019-07-16 DIAGNOSIS — I69354 Hemiplegia and hemiparesis following cerebral infarction affecting left non-dominant side: Secondary | ICD-10-CM | POA: Diagnosis not present

## 2019-07-16 DIAGNOSIS — R32 Unspecified urinary incontinence: Secondary | ICD-10-CM | POA: Diagnosis not present

## 2019-07-16 DIAGNOSIS — Z5181 Encounter for therapeutic drug level monitoring: Secondary | ICD-10-CM | POA: Diagnosis not present

## 2019-07-16 DIAGNOSIS — Z794 Long term (current) use of insulin: Secondary | ICD-10-CM | POA: Diagnosis not present

## 2019-07-16 DIAGNOSIS — M21372 Foot drop, left foot: Secondary | ICD-10-CM | POA: Diagnosis not present

## 2019-07-16 DIAGNOSIS — F209 Schizophrenia, unspecified: Secondary | ICD-10-CM | POA: Diagnosis not present

## 2019-07-16 DIAGNOSIS — Z8616 Personal history of COVID-19: Secondary | ICD-10-CM | POA: Diagnosis not present

## 2019-07-16 DIAGNOSIS — M4316 Spondylolisthesis, lumbar region: Secondary | ICD-10-CM | POA: Diagnosis not present

## 2019-07-16 DIAGNOSIS — Z7901 Long term (current) use of anticoagulants: Secondary | ICD-10-CM | POA: Diagnosis not present

## 2019-07-16 LAB — POCT INR: INR: 4.7 — AB (ref 2.0–3.0)

## 2019-07-16 NOTE — Patient Instructions (Signed)
Been on Doxycycline twice daily for infected toe  Finished yesterday Hold warfarin tonight and tomorrow night then resume 1 tablet daily Recheck in 3 weeks Orders written for [facility] [phone]

## 2019-07-21 DIAGNOSIS — M4316 Spondylolisthesis, lumbar region: Secondary | ICD-10-CM | POA: Diagnosis not present

## 2019-07-21 DIAGNOSIS — E1151 Type 2 diabetes mellitus with diabetic peripheral angiopathy without gangrene: Secondary | ICD-10-CM | POA: Diagnosis not present

## 2019-07-21 DIAGNOSIS — I69354 Hemiplegia and hemiparesis following cerebral infarction affecting left non-dominant side: Secondary | ICD-10-CM | POA: Diagnosis not present

## 2019-07-21 DIAGNOSIS — M21372 Foot drop, left foot: Secondary | ICD-10-CM | POA: Diagnosis not present

## 2019-07-21 DIAGNOSIS — M48061 Spinal stenosis, lumbar region without neurogenic claudication: Secondary | ICD-10-CM | POA: Diagnosis not present

## 2019-07-21 DIAGNOSIS — F209 Schizophrenia, unspecified: Secondary | ICD-10-CM | POA: Diagnosis not present

## 2019-07-23 DIAGNOSIS — M4316 Spondylolisthesis, lumbar region: Secondary | ICD-10-CM | POA: Diagnosis not present

## 2019-07-23 DIAGNOSIS — M21372 Foot drop, left foot: Secondary | ICD-10-CM | POA: Diagnosis not present

## 2019-07-23 DIAGNOSIS — I69354 Hemiplegia and hemiparesis following cerebral infarction affecting left non-dominant side: Secondary | ICD-10-CM | POA: Diagnosis not present

## 2019-07-23 DIAGNOSIS — F209 Schizophrenia, unspecified: Secondary | ICD-10-CM | POA: Diagnosis not present

## 2019-07-23 DIAGNOSIS — E1151 Type 2 diabetes mellitus with diabetic peripheral angiopathy without gangrene: Secondary | ICD-10-CM | POA: Diagnosis not present

## 2019-07-23 DIAGNOSIS — M48061 Spinal stenosis, lumbar region without neurogenic claudication: Secondary | ICD-10-CM | POA: Diagnosis not present

## 2019-07-30 DIAGNOSIS — E1151 Type 2 diabetes mellitus with diabetic peripheral angiopathy without gangrene: Secondary | ICD-10-CM | POA: Diagnosis not present

## 2019-07-30 DIAGNOSIS — I69354 Hemiplegia and hemiparesis following cerebral infarction affecting left non-dominant side: Secondary | ICD-10-CM | POA: Diagnosis not present

## 2019-07-30 DIAGNOSIS — F209 Schizophrenia, unspecified: Secondary | ICD-10-CM | POA: Diagnosis not present

## 2019-07-30 DIAGNOSIS — M4316 Spondylolisthesis, lumbar region: Secondary | ICD-10-CM | POA: Diagnosis not present

## 2019-07-30 DIAGNOSIS — M48061 Spinal stenosis, lumbar region without neurogenic claudication: Secondary | ICD-10-CM | POA: Diagnosis not present

## 2019-07-30 DIAGNOSIS — M21372 Foot drop, left foot: Secondary | ICD-10-CM | POA: Diagnosis not present

## 2019-08-04 ENCOUNTER — Ambulatory Visit (INDEPENDENT_AMBULATORY_CARE_PROVIDER_SITE_OTHER): Payer: Medicare Other | Admitting: *Deleted

## 2019-08-04 ENCOUNTER — Other Ambulatory Visit: Payer: Self-pay

## 2019-08-04 DIAGNOSIS — Z5181 Encounter for therapeutic drug level monitoring: Secondary | ICD-10-CM

## 2019-08-04 DIAGNOSIS — I69354 Hemiplegia and hemiparesis following cerebral infarction affecting left non-dominant side: Secondary | ICD-10-CM | POA: Diagnosis not present

## 2019-08-04 DIAGNOSIS — M4316 Spondylolisthesis, lumbar region: Secondary | ICD-10-CM | POA: Diagnosis not present

## 2019-08-04 DIAGNOSIS — Z8679 Personal history of other diseases of the circulatory system: Secondary | ICD-10-CM | POA: Diagnosis not present

## 2019-08-04 DIAGNOSIS — I82409 Acute embolism and thrombosis of unspecified deep veins of unspecified lower extremity: Secondary | ICD-10-CM

## 2019-08-04 DIAGNOSIS — M48061 Spinal stenosis, lumbar region without neurogenic claudication: Secondary | ICD-10-CM | POA: Diagnosis not present

## 2019-08-04 DIAGNOSIS — E1151 Type 2 diabetes mellitus with diabetic peripheral angiopathy without gangrene: Secondary | ICD-10-CM | POA: Diagnosis not present

## 2019-08-04 DIAGNOSIS — M21372 Foot drop, left foot: Secondary | ICD-10-CM | POA: Diagnosis not present

## 2019-08-04 DIAGNOSIS — F209 Schizophrenia, unspecified: Secondary | ICD-10-CM | POA: Diagnosis not present

## 2019-08-04 LAB — POCT INR: INR: 3.4 — AB (ref 2.0–3.0)

## 2019-08-04 NOTE — Patient Instructions (Signed)
Hold warfarin tonight then decrease dose to 1 tablet daily except 1/2 tablet on Mondays Recheck in 4 weeks Orders written for [facility] [phone]

## 2019-08-05 DIAGNOSIS — F209 Schizophrenia, unspecified: Secondary | ICD-10-CM | POA: Diagnosis not present

## 2019-08-05 DIAGNOSIS — I69354 Hemiplegia and hemiparesis following cerebral infarction affecting left non-dominant side: Secondary | ICD-10-CM | POA: Diagnosis not present

## 2019-08-05 DIAGNOSIS — M48061 Spinal stenosis, lumbar region without neurogenic claudication: Secondary | ICD-10-CM | POA: Diagnosis not present

## 2019-08-05 DIAGNOSIS — E1151 Type 2 diabetes mellitus with diabetic peripheral angiopathy without gangrene: Secondary | ICD-10-CM | POA: Diagnosis not present

## 2019-08-05 DIAGNOSIS — M21372 Foot drop, left foot: Secondary | ICD-10-CM | POA: Diagnosis not present

## 2019-08-05 DIAGNOSIS — M4316 Spondylolisthesis, lumbar region: Secondary | ICD-10-CM | POA: Diagnosis not present

## 2019-08-11 ENCOUNTER — Other Ambulatory Visit (HOSPITAL_COMMUNITY): Payer: Self-pay | Admitting: Psychiatry

## 2019-08-11 DIAGNOSIS — M48061 Spinal stenosis, lumbar region without neurogenic claudication: Secondary | ICD-10-CM | POA: Diagnosis not present

## 2019-08-11 DIAGNOSIS — M21372 Foot drop, left foot: Secondary | ICD-10-CM | POA: Diagnosis not present

## 2019-08-11 DIAGNOSIS — M4316 Spondylolisthesis, lumbar region: Secondary | ICD-10-CM | POA: Diagnosis not present

## 2019-08-11 DIAGNOSIS — I69354 Hemiplegia and hemiparesis following cerebral infarction affecting left non-dominant side: Secondary | ICD-10-CM | POA: Diagnosis not present

## 2019-08-11 DIAGNOSIS — F209 Schizophrenia, unspecified: Secondary | ICD-10-CM

## 2019-08-11 DIAGNOSIS — E1151 Type 2 diabetes mellitus with diabetic peripheral angiopathy without gangrene: Secondary | ICD-10-CM | POA: Diagnosis not present

## 2019-08-11 MED ORDER — RISPERIDONE 1 MG PO TABS: 1.0000 mg | ORAL_TABLET | Freq: Every day | ORAL | 0 refills | Status: DC

## 2019-08-13 NOTE — Progress Notes (Signed)
Virtual Visit via Telephone Note  I connected with [name] on 08/19/19 at 10:00 AM EDT by telephone and verified that I am speaking with the correct person using two identifiers.   I discussed the limitations, risks, security and privacy concerns of performing an evaluation and management service by telephone and the availability of in person appointments. I also discussed with the patient that there may be a patient responsible charge related to this service. The patient expressed understanding and agreed to proceed.     I discussed the assessment and treatment plan with the patient. The patient was provided an opportunity to ask questions and all were answered. The patient agreed with the plan and demonstrated an understanding of the instructions.   The patient was advised to call back or seek an in-person evaluation if the symptoms worsen or if the condition fails to improve as anticipated.  Location: patient- facility, provider- home office   I provided 18 minutes of non-face-to-face time during this encounter.   [name], MD   [facility] MD/PA/NP OP Progress Note  08/19/2019 12:13 PM [name]  MRN:  [id number]  Chief Complaint:  Chief Complaint    Follow-up; Schizophrenia     HPI:  She states that she has been feeling nervous. She has pain all day long. When she is asked about the recent episodes of curing, she states that she "just wanted to have medication, all of it." She has mild to concern that other recent might come into her room. She feels safe otherwise.  She denies insomnia. She feels fatigue.  She denies AH, VH.  She denies ideas of reference.  She denies SI/HI.   [name], the nurse staff at [facility] presents to the interview.  [name] has not noticed much change since up titration of Risperdal.  She curses when she is unable to do things she wants to do in the immediate moment.  [facility] thinks it has been getting worse, although she used to have similar  episodes in the past.  No other behavioral or safety concern.  Eduardo has appetite loss.  [name] states that she has a peer, who has dementia. [name] is not aware whether this peer is trying to come in to her room. She stays in the room most of the time, or plays bingo.   Activities of Daily Living (ADLs):  [name] is dependent in the following:  feeding,  Dependent- bathing and hygiene,  grooming and toileting, continence, in a wheelchair  Visit Diagnosis:    ICD-10-CM   1. Decreased appetite  R63.0   2. Schizophrenia, unspecified type ([place])  F20.9 risperiDONE (RISPERDAL) 1 MG tablet    ABILIFY 2 MG tablet  3. Other fatigue  R53.83 CBC    Comprehensive Metabolic Panel (CMET)    Urinalysis    TSH  4. Mild episode of recurrent major depressive disorder (HCC)  F33.0     Past Psychiatric History: Please see initial evaluation for full details. I have reviewed the history. No updates at this time.     Past Medical History:  Past Medical History:  Diagnosis Date  . Anxiety   . Arthritis   . COVID-19 virus infection    COVID-19+ approx 01/24/19; asymptomatic course with full recovery  . Dependence on wheelchair    pivot/transfers  . Depression    History of psychosis and previous suicide attempt  . DVT, lower extremity, recurrent (HCC)    Long-term Coumadin per Dr. Legrand Rams  . Essential  hypertension   . GERD (gastroesophageal reflux disease)   . Hemiplegia ([place]) 2010   Left side  . History of stroke    Acute infarct and right cerebral white matter small vessel disease 12/10  . Leg DVT (deep venous thromboembolism), acute ([place]) 2006  . Schizophrenia ([place])   . Stroke [facility])    left sided weakness  . Type 2 diabetes mellitus ([place])     Past Surgical History:  Procedure Laterality Date  . BACK SURGERY    . BIOPSY N/A 11/24/2014   Procedure: BIOPSY;  Surgeon: Danie Binder, MD;  Location: [facility] ORS;  Service: Endoscopy;  Laterality: N/A;  . BIOPSY  05/17/2016   Procedure:  BIOPSY;  Surgeon: Danie Binder, MD;  Location: [facility] END[facility] SU[facility];  Service: Endoscopy;;  gastric biopsy  . COLONOSCOPY WITH PROPOFOL N/A 11/24/2014   Dr. Rudie Meyer polyps removed/moderate sized internal hemorrhoids, tubular adenomas. Next surveillance in 3 years  . ESOPHAGOGASTRODUODENOSCOPY (EGD) WITH PROPOFOL N/A 11/24/2014   Dr. Clayburn Pert HH/patent stricture at the gastroesophageal junction, mild non-erosive gastritis, path negative for H.pylori or celiac sprue  . ESOPHAGOGASTRODUODENOSCOPY (EGD) WITH PROPOFOL N/A 05/17/2016   Procedure: ESOPHAGOGASTRODUODENOSCOPY (EGD) WITH PROPOFOL;  Surgeon: Danie Binder, MD;  Location: [facility];  Service: Endoscopy;  Laterality: N/A;  12:45pm  . GIVENS CAPSULE STUDY N/A 12/11/2014   MULTILPLE EROSION IN the stomach WITH ACTIVE OOZING. OCCASIONAL EROSIONS AND RARE ULCER SEEN IN PROXIMAL SMALL BOWEL . No masses or AVMs SEEN. NO OLD BLOOD OR FRESH BLOOD SEEN.   . POLYPECTOMY N/A 11/24/2014   Procedure: POLYPECTOMY;  Surgeon: Danie Binder, MD;  Location: [facility];  Service: Endoscopy;  Laterality: N/A;  . SAVORY DILATION N/A 05/17/2016   Procedure: SAVORY DILATION;  Surgeon: Danie Binder, MD;  Location: [facility];  Service: Endoscopy;  Laterality: N/A;    Family Psychiatric History: Please see initial evaluation for full details. I have reviewed the history. No updates at this time.     Family History:  Family History  Problem Relation Age of Onset  . Hypertension Mother   . Colon cancer Neg Hx     Social History:  Social History   Socioeconomic History  . Marital status: Widowed    Spouse name: Not on file  . Number of children: Not on file  . Years of education: Not on file  . Highest education level: Not on file  Occupational History  . Not on file  Tobacco Use  . Smoking status: Former Smoker    Packs/day: 0.25    Years: 20.00    Pack years: 5.00    Types: Cigarettes    Quit date: 01/24/1995    Years since quitting: 24.5  .  Smokeless tobacco: Never Used  Vaping Use  . Vaping Use: Never used  Substance and Sexual Activity  . Alcohol use: No    Alcohol/week: 0.0 standard drinks  . Drug use: No  . Sexual activity: Never    Birth control/protection: Post-menopausal  Other Topics Concern  . Not on file  Social History Narrative  . Not on file   Social Determinants of Health   Financial Resource Strain:   . Difficulty of Paying Living Expenses:   Food Insecurity:   . Worried About Charity fundraiser in the Last Year:   . Arboriculturist in the Last Year:   Transportation Needs:   . Film/video editor (Medical):   Marland Kitchen Lack of Transportation (Non-Medical):  Physical Activity:   . Days of Exercise per Week:   . Minutes of Exercise per Session:   Stress:   . Feeling of Stress :   Social Connections:   . Frequency of Communication with Friends and Family:   . Frequency of Social Gatherings with Friends and Family:   . Attends Religious Services:   . Active Member of Clubs or Organizations:   . Attends Archivist Meetings:   Marland Kitchen Marital Status:     Allergies:  Allergies  Allergen Reactions  . Sulfa Antibiotics Rash  . Sulfonamide Derivatives Rash    REACTION: rash    Metabolic Disorder Labs: Lab Results  Component Value Date   HGBA1C 7.2 (H) 06/27/2018   MPG 160 06/27/2018   MPG 134 03/15/2018   No results found for: PROLACTIN Lab Results  Component Value Date   CHOL 133 10/07/2016   TRIG 252 (H) 10/07/2016   HDL 28 (L) 10/07/2016   CHOLHDL 4.8 10/07/2016   VLDL 50 (H) 10/07/2016   LDLCALC 55 10/07/2016   LDLCALC (H) 01/03/2009    143        Total Cholesterol/HDL:CHD Risk Coronary Heart Disease Risk Table                     Men   Women  1/2 Average Risk   3.4   3.3  Average Risk       5.0   4.4  2 X Average Risk   9.6   7.1  3 X Average Risk  23.4   11.0        Use the calculated Patient Ratio above and the CHD Risk Table to determine the patient's CHD Risk.         ATP III CLASSIFICATION (LDL):  <100     mg/dL   Optimal  100-129  mg/dL   Near or Above                    Optimal  130-159  mg/dL   Borderline  160-189  mg/dL   High  >190     mg/dL   Very High   Lab Results  Component Value Date   TSH 1.138 03/29/2017   TSH 0.538 02/13/2016    Therapeutic Level Labs: No results found for: LITHIUM No results found for: VALPROATE No components found for:  CBMZ  Current Medications: Current Outpatient Medications  Medication Sig Dispense Refill  . ABILIFY 2 MG tablet Take 1 tablet (2 mg total) by mouth daily. 28 tablet 1  . acetaminophen (TYLENOL) 325 MG tablet Take 650 mg by mouth 3 (three) times daily as needed.    . calcium carbonate (TUMS - DOSED IN MG ELEMENTAL CALCIUM) 500 MG chewable tablet Chew 1 tablet by mouth 4 (four) times daily as needed for indigestion or heartburn.     . cholecalciferol (VITAMIN D) 1000 units tablet Take 2,000 Units by mouth daily.    . clonazePAM (KLONOPIN) 0.5 MG tablet Take 1 tablet (0.5 mg total) by mouth 3 (three) times daily. 90 tablet 2  . docusate sodium (COLACE) 100 MG capsule Take 200 mg by mouth at bedtime.     . DULoxetine (CYMBALTA) 60 MG capsule Take 120 mg by mouth daily.     Marland Kitchen gabapentin (NEURONTIN) 300 MG capsule Take 300 mg by mouth 3 (three) times daily.    Marland Kitchen HYDROcodone-acetaminophen (NORCO) 10-325 MG tablet Take 1 tablet by mouth every 8 (eight) hours  as needed.     . hydrocortisone (ANUSOL-HC) 2.5 % rectal cream Place 1 application rectally 2 (two) times daily as needed.     . iron polysaccharides (NIFEREX) 150 MG capsule Take 1 capsule (150 mg total) by mouth daily. 30 capsule 11  . latanoprost (XALATAN) 0.005 % ophthalmic solution Place 1 drop into both eyes at bedtime.    Marland Kitchen LEVEMIR FLEXTOUCH 100 UNIT/ML Pen INJECT 28 UNITS INTO THE SKIN AT BEDTIME. 15 mL 3  . loratadine (CLARITIN) 10 MG tablet Take 10 mg by mouth daily.    Marland Kitchen lubiprostone (AMITIZA) 24 MCG capsule Take 1 capsule (24 mcg  total) by mouth 2 (two) times daily with a meal. 60 capsule 5  . metoprolol succinate (TOPROL-XL) 25 MG 24 hr tablet Take 0.5 tablets (12.5 mg total) by mouth daily. (Patient taking differently: Take 12.5 mg by mouth 2 (two) times daily. )    . midodrine (PROAMATINE) 2.5 MG tablet Take 5 mg by mouth 3 (three) times daily with meals.     . mirabegron ER (MYRBETRIQ) 25 MG TB24 tablet Take 25 mg by mouth daily.    . mirtazapine (REMERON) 30 MG tablet Take 30 mg by mouth at bedtime.    . ondansetron (ZOFRAN) 4 MG tablet Take 1 tablet (4 mg total) by mouth 4 (four) times daily -  before meals and at bedtime. 120 tablet 3  . pantoprazole (PROTONIX) 40 MG tablet Take 40 mg by mouth daily.    . Prucalopride Succinate (MOTEGRITY) 2 MG TABS Take 1 tablet (2 mg total) by mouth daily. 30 tablet 5  . risperiDONE (RISPERDAL) 0.5 MG tablet 0.5 mg daily, and 1 mg at night (along with 1 mg tab) 28 tablet 0  . risperiDONE (RISPERDAL) 1 MG tablet Take 1 tablet (1 mg total) by mouth in the morning and at bedtime. 56 tablet 1  . simvastatin (ZOCOR) 20 MG tablet Take 20 mg by mouth at bedtime.     Marland Kitchen tiZANidine (ZANAFLEX) 2 MG tablet Take 2 mg by mouth daily.    Marland Kitchen warfarin (COUMADIN) 5 MG tablet Take 1 tablet tonight then increase dose to 1 tablet daily except 1/2 tablet on Mondays, Wednesdays and Fridays 26 tablet 6  . zolpidem (AMBIEN) 10 MG tablet Take 1 tablet (10 mg total) by mouth at bedtime. Please hold this medication if she has drowsiness 30 tablet 2   No current facility-administered medications for this visit.     Musculoskeletal: Strength & Muscle Tone: N/A Gait & Station: N/A Patient leans: N/A  Psychiatric Specialty Exam: Review of Systems  Psychiatric/Behavioral: Positive for agitation. Negative for behavioral problems, confusion, decreased concentration, dysphoric mood, hallucinations, self-injury, sleep disturbance and suicidal ideas. The patient is nervous/anxious. The patient is not  hyperactive.   All other systems reviewed and are negative.   There were no vitals taken for this visit.There is no height or weight on file to calculate BMI.  General Appearance: NA  Eye Contact:  NA  Speech:  Clear and Coherent  Volume:  Normal  Mood:  not good  Affect:  NA  Thought Process:  Coherent, disorganized at times  Orientation:  Full (Time, Place, and Person)  Thought Content: no paranoia   Suicidal Thoughts:  No  Homicidal Thoughts:  No  Memory:  Immediate;   Fair  Judgement:  Fair  Insight:  Shallow  Psychomotor Activity:  Normal  Concentration:  Concentration: Fair and Attention Span: Fair  Recall:  Poor  Fund of  Knowledge: Fair  Language: Good  Akathisia:  No  Handed:  Right  AIMS (if indicated): not done  Assets:  Social Support  ADL's:  Intact  Cognition: WNL  Sleep:  Good   Screenings: PHQ2-9     Office Visit from 11/28/2017 in [facility] Office Visit from 09/25/2017 in [facility] Office Visit from 03/28/2017 in [facility] Office Visit from 09/13/2016 in [facility] Office Visit from 06/07/2016 in [facility]  PHQ-2 Total Score 0 0 0 0 0       Assessment and Plan:  [name] is a 74 y.o. year old female with a history of  schizophrenia, depression, SVT, DVT, history of stroke on warfarin, type II diabetes, hypertension, GERD, who presents for follow up appointment for below.   1. Schizophrenia, unspecified type ([place]) 4. Mild episode of recurrent major depressive disorder (HCC) There has been worsening in verbal aggression without significant triggers since the last visit.  She did have limited benefit from recent up titration of Risperdal.  We will do further up titration of Risperdal to target schizophrenia.  Discussed potential metabolic side effect and EPS.  We will continue Abilify at the current dose to target schizophrenia.   Noted that although it is preferable to do monotherapy, she did have worsening in withdrawal symptoms in the context of tapering off Risperdal.  Noted that the patient has been on duloxetine, and mirtazapine, prescribed by her PCP.   2. Decreased appetite 3. Other fatigue We will obtain labs to rule out medical causes contributing to these symptoms as well as recent behavioral changes.    Plan 1. ContinueAbilify2 mg daily 2. Increase Risperidone 1 mg twice a day  - onZolpidem 10 mg at night- prescribed by Dr. Legrand Rams - on clonazepam 0.5 mg three timesa day for anxiety (prescribed by Dr. Merlene Laughter) - on mirtazapine 30 mg at night, prescribed by Dr. Legrand Rams - on duloxetine 120 mg daily(prescribed byDr. Legrand Rams.)  4.Next appointment: 9/15 at 10: 20 for 30 mins phone 5. Obtain blood test, CBC, CMP, TSH, urinalysis to rule out medical cause contributing to her symptoms.  - Fax [facility] to [phone] -on gabapentin 300 mg TID   The patient demonstrates the following risk factors for suicide: Chronic risk factors for suicide include:psychiatric disorder ofschizophrenia by history, depression. Acute risk factorsfor suicide include: unemployment. Protective factorsfor this patient include: positive social support and hope for the future. Considering these factors, the overall suicide risk at this point appears to below. Patientisappropriate for outpatient follow up.   [name], MD 08/19/2019, 12:13 PM

## 2019-08-14 ENCOUNTER — Other Ambulatory Visit (HOSPITAL_COMMUNITY): Payer: Self-pay | Admitting: Psychiatry

## 2019-08-14 ENCOUNTER — Telehealth (HOSPITAL_COMMUNITY): Payer: Self-pay | Admitting: *Deleted

## 2019-08-14 MED ORDER — RISPERIDONE 0.5 MG PO TABS: ORAL_TABLET | ORAL | 0 refills | Status: DC

## 2019-08-14 NOTE — Telephone Encounter (Signed)
Error

## 2019-08-14 NOTE — Telephone Encounter (Signed)
Spoke with Tammy the Director and informed her with what provider stated and she stated that patient is not having any of those issues and she will start patient on the new medication and hopefully that helps.

## 2019-08-14 NOTE — Telephone Encounter (Signed)
[name] with [employer] Calling to inform provider that patient is acting out and they do not know what to do with her. Cussing out people if they look at her or wave at her, rude for no reason and they can not figure out whats going on. Per [name] patient is calling staff all cuss words in the book if she wakes up and don't get her meds Immediately. They know patient have an appt next week but they want to know what to do. [phone].

## 2019-08-14 NOTE — Telephone Encounter (Signed)
Please ask them if she has any fever, cough, decreased appetite, urinary symptoms or any physical symptoms. If she has any, advise the staff to bring her to be checked by PCP as medical condition (especially some infection) can cause such issues. If not, advise them to start risperidone 0.5 mg in the morning in addition to 1 mg at night. I ordered medication to the pharmacy. Please advise them to continue to try to verbally redirect the patient as much as possible, though.

## 2019-08-15 DIAGNOSIS — M21372 Foot drop, left foot: Secondary | ICD-10-CM | POA: Diagnosis not present

## 2019-08-15 DIAGNOSIS — F209 Schizophrenia, unspecified: Secondary | ICD-10-CM | POA: Diagnosis not present

## 2019-08-15 DIAGNOSIS — M48061 Spinal stenosis, lumbar region without neurogenic claudication: Secondary | ICD-10-CM | POA: Diagnosis not present

## 2019-08-15 DIAGNOSIS — M4316 Spondylolisthesis, lumbar region: Secondary | ICD-10-CM | POA: Diagnosis not present

## 2019-08-15 DIAGNOSIS — R32 Unspecified urinary incontinence: Secondary | ICD-10-CM | POA: Diagnosis not present

## 2019-08-15 DIAGNOSIS — Z8616 Personal history of COVID-19: Secondary | ICD-10-CM | POA: Diagnosis not present

## 2019-08-15 DIAGNOSIS — E1151 Type 2 diabetes mellitus with diabetic peripheral angiopathy without gangrene: Secondary | ICD-10-CM | POA: Diagnosis not present

## 2019-08-15 DIAGNOSIS — I69354 Hemiplegia and hemiparesis following cerebral infarction affecting left non-dominant side: Secondary | ICD-10-CM | POA: Diagnosis not present

## 2019-08-15 DIAGNOSIS — Z7901 Long term (current) use of anticoagulants: Secondary | ICD-10-CM | POA: Diagnosis not present

## 2019-08-15 DIAGNOSIS — Z794 Long term (current) use of insulin: Secondary | ICD-10-CM | POA: Diagnosis not present

## 2019-08-18 ENCOUNTER — Other Ambulatory Visit: Payer: Self-pay | Admitting: "Endocrinology

## 2019-08-18 DIAGNOSIS — Z79891 Long term (current) use of opiate analgesic: Secondary | ICD-10-CM | POA: Diagnosis not present

## 2019-08-18 DIAGNOSIS — I69359 Hemiplegia and hemiparesis following cerebral infarction affecting unspecified side: Secondary | ICD-10-CM | POA: Diagnosis not present

## 2019-08-18 DIAGNOSIS — F419 Anxiety disorder, unspecified: Secondary | ICD-10-CM | POA: Diagnosis not present

## 2019-08-18 DIAGNOSIS — M545 Low back pain: Secondary | ICD-10-CM | POA: Diagnosis not present

## 2019-08-18 DIAGNOSIS — M5000 Cervical disc disorder with myelopathy, unspecified cervical region: Secondary | ICD-10-CM | POA: Diagnosis not present

## 2019-08-18 DIAGNOSIS — G894 Chronic pain syndrome: Secondary | ICD-10-CM | POA: Diagnosis not present

## 2019-08-18 DIAGNOSIS — G2401 Drug induced subacute dyskinesia: Secondary | ICD-10-CM | POA: Diagnosis not present

## 2019-08-19 ENCOUNTER — Encounter (HOSPITAL_COMMUNITY): Payer: Self-pay | Admitting: Psychiatry

## 2019-08-19 ENCOUNTER — Other Ambulatory Visit: Payer: Self-pay

## 2019-08-19 ENCOUNTER — Telehealth (INDEPENDENT_AMBULATORY_CARE_PROVIDER_SITE_OTHER): Payer: Medicare Other | Admitting: Psychiatry

## 2019-08-19 DIAGNOSIS — R5383 Other fatigue: Secondary | ICD-10-CM | POA: Diagnosis not present

## 2019-08-19 DIAGNOSIS — M4316 Spondylolisthesis, lumbar region: Secondary | ICD-10-CM | POA: Diagnosis not present

## 2019-08-19 DIAGNOSIS — R630 Anorexia: Secondary | ICD-10-CM | POA: Diagnosis not present

## 2019-08-19 DIAGNOSIS — F209 Schizophrenia, unspecified: Secondary | ICD-10-CM

## 2019-08-19 DIAGNOSIS — F330 Major depressive disorder, recurrent, mild: Secondary | ICD-10-CM

## 2019-08-19 DIAGNOSIS — E1151 Type 2 diabetes mellitus with diabetic peripheral angiopathy without gangrene: Secondary | ICD-10-CM | POA: Diagnosis not present

## 2019-08-19 DIAGNOSIS — M48061 Spinal stenosis, lumbar region without neurogenic claudication: Secondary | ICD-10-CM | POA: Diagnosis not present

## 2019-08-19 DIAGNOSIS — M21372 Foot drop, left foot: Secondary | ICD-10-CM | POA: Diagnosis not present

## 2019-08-19 DIAGNOSIS — I69354 Hemiplegia and hemiparesis following cerebral infarction affecting left non-dominant side: Secondary | ICD-10-CM | POA: Diagnosis not present

## 2019-08-19 MED ORDER — ABILIFY 2 MG PO TABS: 2.0000 mg | ORAL_TABLET | Freq: Every day | ORAL | 1 refills | Status: DC

## 2019-08-19 MED ORDER — RISPERIDONE 1 MG PO TABS: 1.0000 mg | ORAL_TABLET | Freq: Two times a day (BID) | ORAL | 1 refills | Status: DC

## 2019-08-19 NOTE — Addendum Note (Signed)
Addended by: [name] on: 08/19/2019 12:18 PM   Modules accepted: Orders

## 2019-08-19 NOTE — Addendum Note (Signed)
Addended by: [name] on: 08/19/2019 12:17 PM   Modules accepted: Orders

## 2019-08-19 NOTE — Patient Instructions (Addendum)
1. ContinueAbilify2 mg daily 2. Increase Risperidone 1 mg twice a day  3.Next appointment: 9/15 at 10: 20  4. Obtain blood test, urine test Fax [facility] to [phone]

## 2019-08-20 ENCOUNTER — Telehealth (HOSPITAL_COMMUNITY): Payer: Self-pay | Admitting: Psychiatry

## 2019-08-20 NOTE — Telephone Encounter (Signed)
Could you contact the pharmacy, and notify of medication change. Risperdal will be increased up to 1 mg twice a day. The facility will receive [facility] with this instruction as well. Thanks.

## 2019-08-20 NOTE — Telephone Encounter (Signed)
Called pharmacy and spoke with Ed and informed him with what provider stated and he verbalized understanding.

## 2019-08-21 DIAGNOSIS — M4316 Spondylolisthesis, lumbar region: Secondary | ICD-10-CM | POA: Diagnosis not present

## 2019-08-21 DIAGNOSIS — M21372 Foot drop, left foot: Secondary | ICD-10-CM | POA: Diagnosis not present

## 2019-08-21 DIAGNOSIS — F209 Schizophrenia, unspecified: Secondary | ICD-10-CM | POA: Diagnosis not present

## 2019-08-21 DIAGNOSIS — I69354 Hemiplegia and hemiparesis following cerebral infarction affecting left non-dominant side: Secondary | ICD-10-CM | POA: Diagnosis not present

## 2019-08-21 DIAGNOSIS — M48061 Spinal stenosis, lumbar region without neurogenic claudication: Secondary | ICD-10-CM | POA: Diagnosis not present

## 2019-08-21 DIAGNOSIS — E1151 Type 2 diabetes mellitus with diabetic peripheral angiopathy without gangrene: Secondary | ICD-10-CM | POA: Diagnosis not present

## 2019-08-26 ENCOUNTER — Other Ambulatory Visit: Payer: Self-pay

## 2019-08-26 ENCOUNTER — Ambulatory Visit (INDEPENDENT_AMBULATORY_CARE_PROVIDER_SITE_OTHER): Payer: Medicare Other | Admitting: Nurse Practitioner

## 2019-08-26 ENCOUNTER — Encounter: Payer: Self-pay | Admitting: Nurse Practitioner

## 2019-08-26 VITALS — BP 115/59 | HR 69 | Temp 97.5°F | Ht 65.0 in | Wt 122.0 lb

## 2019-08-26 DIAGNOSIS — R634 Abnormal weight loss: Secondary | ICD-10-CM | POA: Insufficient documentation

## 2019-08-26 DIAGNOSIS — M48061 Spinal stenosis, lumbar region without neurogenic claudication: Secondary | ICD-10-CM | POA: Diagnosis not present

## 2019-08-26 DIAGNOSIS — E1151 Type 2 diabetes mellitus with diabetic peripheral angiopathy without gangrene: Secondary | ICD-10-CM | POA: Diagnosis not present

## 2019-08-26 DIAGNOSIS — K582 Mixed irritable bowel syndrome: Secondary | ICD-10-CM

## 2019-08-26 DIAGNOSIS — K219 Gastro-esophageal reflux disease without esophagitis: Secondary | ICD-10-CM | POA: Insufficient documentation

## 2019-08-26 DIAGNOSIS — M21372 Foot drop, left foot: Secondary | ICD-10-CM | POA: Diagnosis not present

## 2019-08-26 DIAGNOSIS — F209 Schizophrenia, unspecified: Secondary | ICD-10-CM | POA: Diagnosis not present

## 2019-08-26 DIAGNOSIS — I69354 Hemiplegia and hemiparesis following cerebral infarction affecting left non-dominant side: Secondary | ICD-10-CM | POA: Diagnosis not present

## 2019-08-26 DIAGNOSIS — M4316 Spondylolisthesis, lumbar region: Secondary | ICD-10-CM | POA: Diagnosis not present

## 2019-08-26 MED ORDER — PANTOPRAZOLE SODIUM 40 MG PO TBEC: 40.0000 mg | DELAYED_RELEASE_TABLET | Freq: Two times a day (BID) | ORAL | 3 refills | Status: DC

## 2019-08-26 NOTE — Patient Instructions (Signed)
Your health issues we discussed today were:   GERD (reflux/heartburn): 1. I have sent a new prescription to your pharmacy to increase Protonix to 40 mg twice daily 2. You can continue to use Tums as needed as well 3. Call us for any worsening or severe symptoms  Irritable bowel syndrome with constipation and diarrhea: 1. I am glad you are doing well with your irritable bowel syndrome! 2. Continue your current medications and bowel regimen 3. Call us for any worsening or severe symptoms  Weight loss: 1. Your weight is relatively stable and within 3 pounds of your last office visit with [place] 3 months ago 2. I recommend you start using Ensure, boost, or similar supplement twice a day 3. Using a supplement such as boost or Ensure will help maintain your nutrition  Overall I recommend:  1. Continue your other current medications 2. Return for follow-up in 3 months 3. Call us if you have any questions or concerns   ---------------------------------------------------------------  I am glad you have gotten your COVID-19 vaccination!  Even though you are fully vaccinated you should continue to follow CDC and state/local guidelines.  ---------------------------------------------------------------   At [facility] we value your feedback. You may receive a survey about your visit today. Please share your experience as we strive to create trusting relationships with our patients to provide genuine, compassionate, quality care.  We appreciate your understanding and patience as we review any laboratory studies, imaging, and other diagnostic tests that are ordered as we care for you. Our office policy is 5 business days for review of these results, and any emergent or urgent results are addressed in a timely manner for your best interest. If you do not hear from our office in 1 week, please contact us.   We also encourage the use of MyChart, which contains your medical information for  your review as well. If you are not enrolled in this feature, an access code is on this after visit summary for your convenience. Thank you for allowing [place] to be involved in your care.  It was great to see you today!  I hope you have a great Summer!!

## 2019-08-26 NOTE — Assessment & Plan Note (Signed)
Having some worsening GERD including esophageal burning and nausea.  She is currently on Protonix 40 mg once daily and has been requiring more frequent times.  At this point I recommended that she increase her Protonix to 40 mg twice a day.  She can still use Tums as needed.  Call for any worsening or severe symptoms or if no improvement on increased dosing.  Follow-up in 3 months otherwise.  Updated prescription was sent to the pharmacy.

## 2019-08-26 NOTE — Progress Notes (Signed)
Referring Provider: [name], MD Primary Care Physician:  [name], MD Primary GI:  Dr. Gala Romney (in the absence of Dr. Oneida Alar); pending Dr. Abbey Chatters  Chief Complaint  Patient presents with  . Nausea    HPI:   [name] is a 74 y.o. female who presents for follow-up.  The patient was last seen in our office 02/24/2019 for abdominal pain, constipation, diarrhea.  Noted history of IBS, IDA, GERD.  Previous bleeding work-up including colonoscopy/EGD/capsule study in 2016 felt IDA related to erosive gastropathy and enteropathy and recommended repeat colonoscopy in 2019.  Also noted history of liver hemangioma on imaging with updated EGD in 2018 was chronic gastritis negative for H. pylori.  IBS typically mixed type and well controlled on regimen of MiraLAX and Imodium.  At her last visit noted improvement in constipation and diarrhea, some persistent abdominal pain.  Declines any further colonoscopy.  Lower abdominal pain associated with a bowel movement improves afterward.  No hematochezia.  Some intermittent nausea but no vomiting.  Nausea medication helps somewhat.  Notes somewhat poor appetite but weight was up 8 pounds objectively.  No other overt GI complaints.  At the end of the visit noted her abdominal pain is "not that bad, I can tolerated no problem."  Recommended continue current medications, follow-up in 6 months.  At her last visit she was on Protonix 40 mg daily and Tums as needed.  Today she is accompanied by a staff member from her facility/home.  Today she states she is doing okay overall. She states she's been having worsening GERD, still on Protonix 40 mg daily. Has to use TUMS more frequently lately. Has esophageal burning and nausea. No vomiting. Denies abdominal pain. Constipation/diarrhea and overall IBS doing well on current regimen. Denies fever, chills. Has had some unintentional weight loss, has lower appetite, prefers to eat cereal most of the day. Objectively  her weight is within 3 lbs of her last visit 3 months ago. Thinks she would like Ensure or Boost. Does have Carnation at the facility. Denies URI or flu-like symptoms. Denies loss of sense of taste or smell. The patient has received COVID-19 vaccination(s). Denies chest pain, dyspnea, dizziness, lightheadedness, syncope, near syncope. Denies any other upper or lower GI symptoms.  Past Medical History:  Diagnosis Date  . Anxiety   . Arthritis   . COVID-19 virus infection    COVID-19+ approx 01/24/19; asymptomatic course with full recovery  . Dependence on wheelchair    pivot/transfers  . Depression    History of psychosis and previous suicide attempt  . DVT, lower extremity, recurrent (HCC)    Long-term Coumadin per Dr. Legrand Rams  . Essential hypertension   . GERD (gastroesophageal reflux disease)   . Hemiplegia ([place]) 2010   Left side  . History of stroke    Acute infarct and right cerebral white matter small vessel disease 12/10  . Leg DVT (deep venous thromboembolism), acute ([place]) 2006  . Schizophrenia ([place])   . Stroke [facility])    left sided weakness  . Type 2 diabetes mellitus ([place])     Past Surgical History:  Procedure Laterality Date  . BACK SURGERY    . BIOPSY N/A 11/24/2014   Procedure: BIOPSY;  Surgeon: Danie Binder, MD;  Location: [facility];  Service: Endoscopy;  Laterality: N/A;  . BIOPSY  05/17/2016   Procedure: BIOPSY;  Surgeon: Danie Binder, MD;  Location: [facility];  Service: Endoscopy;;  gastric biopsy  . COLONOSCOPY  WITH PROPOFOL N/A 11/24/2014   Dr. Rudie Meyer polyps removed/moderate sized internal hemorrhoids, tubular adenomas. Next surveillance in 3 years  . ESOPHAGOGASTRODUODENOSCOPY (EGD) WITH PROPOFOL N/A 11/24/2014   Dr. Clayburn Pert HH/patent stricture at the gastroesophageal junction, mild non-erosive gastritis, path negative for H.pylori or celiac sprue  . ESOPHAGOGASTRODUODENOSCOPY (EGD) WITH PROPOFOL N/A 05/17/2016   Procedure: ESOPHAGOGASTRODUODENOSCOPY (EGD)  WITH PROPOFOL;  Surgeon: Danie Binder, MD;  Location: [facility];  Service: Endoscopy;  Laterality: N/A;  12:45pm  . GIVENS CAPSULE STUDY N/A 12/11/2014   MULTILPLE EROSION IN the stomach WITH ACTIVE OOZING. OCCASIONAL EROSIONS AND RARE ULCER SEEN IN PROXIMAL SMALL BOWEL . No masses or AVMs SEEN. NO OLD BLOOD OR FRESH BLOOD SEEN.   . POLYPECTOMY N/A 11/24/2014   Procedure: POLYPECTOMY;  Surgeon: Danie Binder, MD;  Location: [facility];  Service: Endoscopy;  Laterality: N/A;  . SAVORY DILATION N/A 05/17/2016   Procedure: SAVORY DILATION;  Surgeon: Danie Binder, MD;  Location: [facility];  Service: Endoscopy;  Laterality: N/A;    Current Outpatient Medications  Medication Sig Dispense Refill  . ABILIFY 2 MG tablet Take 1 tablet (2 mg total) by mouth daily. 28 tablet 1  . acetaminophen (TYLENOL) 325 MG tablet Take 650 mg by mouth 3 (three) times daily as needed.    . calcium carbonate (TUMS - DOSED IN MG ELEMENTAL CALCIUM) 500 MG chewable tablet Chew 1 tablet by mouth 4 (four) times daily as needed for indigestion or heartburn.     . cholecalciferol (VITAMIN D) 1000 units tablet Take 2,000 Units by mouth daily.    . clonazePAM (KLONOPIN) 0.5 MG tablet Take 1 tablet (0.5 mg total) by mouth 3 (three) times daily. 90 tablet 2  . docusate sodium (COLACE) 100 MG capsule Take 200 mg by mouth at bedtime.     . DULoxetine (CYMBALTA) 60 MG capsule Take 120 mg by mouth daily.     Marland Kitchen gabapentin (NEURONTIN) 300 MG capsule Take 300 mg by mouth 3 (three) times daily.    Marland Kitchen HYDROcodone-acetaminophen (NORCO) 10-325 MG tablet Take 1 tablet by mouth every 8 (eight) hours as needed.     . hydrocortisone (ANUSOL-HC) 2.5 % rectal cream Place 1 application rectally 2 (two) times daily as needed.     . iron polysaccharides (NIFEREX) 150 MG capsule Take 1 capsule (150 mg total) by mouth daily. 30 capsule 11  . latanoprost (XALATAN) 0.005 % ophthalmic solution Place 1 drop into both eyes at bedtime.    Marland Kitchen LEVEMIR  FLEXTOUCH 100 UNIT/ML Pen INJECT 28 UNITS INTO THE SKIN AT BEDTIME. 15 mL 3  . loratadine (CLARITIN) 10 MG tablet Take 10 mg by mouth daily.    Marland Kitchen lubiprostone (AMITIZA) 24 MCG capsule Take 1 capsule (24 mcg total) by mouth 2 (two) times daily with a meal. 60 capsule 5  . metoprolol succinate (TOPROL-XL) 25 MG 24 hr tablet Take 0.5 tablets (12.5 mg total) by mouth daily. (Patient taking differently: Take 12.5 mg by mouth 2 (two) times daily. )    . midodrine (PROAMATINE) 2.5 MG tablet Take 5 mg by mouth 3 (three) times daily with meals.     . mirabegron ER (MYRBETRIQ) 25 MG TB24 tablet Take 25 mg by mouth daily.    . mirtazapine (REMERON) 30 MG tablet Take 30 mg by mouth at bedtime.    . ondansetron (ZOFRAN) 4 MG tablet Take 1 tablet (4 mg total) by mouth 4 (four) times daily -  before meals and  at bedtime. 120 tablet 3  . Prucalopride Succinate (MOTEGRITY) 2 MG TABS Take 1 tablet (2 mg total) by mouth daily. 30 tablet 5  . risperiDONE (RISPERDAL) 0.5 MG tablet 0.5 mg daily, and 1 mg at night (along with 1 mg tab) 28 tablet 0  . risperiDONE (RISPERDAL) 1 MG tablet Take 1 tablet (1 mg total) by mouth in the morning and at bedtime. 56 tablet 1  . simvastatin (ZOCOR) 20 MG tablet Take 20 mg by mouth at bedtime.     Marland Kitchen tiZANidine (ZANAFLEX) 2 MG tablet Take 2 mg by mouth daily.    Marland Kitchen warfarin (COUMADIN) 5 MG tablet Take 1 tablet tonight then increase dose to 1 tablet daily except 1/2 tablet on Mondays, Wednesdays and Fridays 26 tablet 6  . zolpidem (AMBIEN) 10 MG tablet Take 1 tablet (10 mg total) by mouth at bedtime. Please hold this medication if she has drowsiness 30 tablet 2  . pantoprazole (PROTONIX) 40 MG tablet Take 1 tablet (40 mg total) by mouth 2 (two) times daily before a meal. 270 tablet 3   No current facility-administered medications for this visit.    Allergies as of 08/26/2019 - Review Complete 08/26/2019  Allergen Reaction Noted  . Sulfa antibiotics Rash 02/23/2010  . Sulfonamide  derivatives Rash     Family History  Problem Relation Age of Onset  . Hypertension Mother   . Colon cancer Neg Hx     Social History   Socioeconomic History  . Marital status: Widowed    Spouse name: Not on file  . Number of children: Not on file  . Years of education: Not on file  . Highest education level: Not on file  Occupational History  . Not on file  Tobacco Use  . Smoking status: Former Smoker    Packs/day: 0.25    Years: 20.00    Pack years: 5.00    Types: Cigarettes    Quit date: 01/24/1995    Years since quitting: 24.6  . Smokeless tobacco: Never Used  Vaping Use  . Vaping Use: Never used  Substance and Sexual Activity  . Alcohol use: No    Alcohol/week: 0.0 standard drinks  . Drug use: No  . Sexual activity: Never    Birth control/protection: Post-menopausal  Other Topics Concern  . Not on file  Social History Narrative  . Not on file   Social Determinants of Health   Financial Resource Strain:   . Difficulty of Paying Living Expenses:   Food Insecurity:   . Worried About Charity fundraiser in the Last Year:   . Arboriculturist in the Last Year:   Transportation Needs:   . Film/video editor (Medical):   Marland Kitchen Lack of Transportation (Non-Medical):   Physical Activity:   . Days of Exercise per Week:   . Minutes of Exercise per Session:   Stress:   . Feeling of Stress :   Social Connections:   . Frequency of Communication with Friends and Family:   . Frequency of Social Gatherings with Friends and Family:   . Attends Religious Services:   . Active Member of Clubs or Organizations:   . Attends Archivist Meetings:   Marland Kitchen Marital Status:     Subjective: Review of Systems  Constitutional: Positive for weight loss. Negative for chills, fever and malaise/fatigue.  HENT: Negative for congestion and sore throat.   Respiratory: Negative for cough and shortness of breath.   Cardiovascular: Negative for  chest pain and palpitations.    Gastrointestinal: Positive for heartburn and nausea. Negative for abdominal pain, blood in stool, diarrhea, melena and vomiting.  Musculoskeletal: Negative for joint pain and myalgias.  Skin: Negative for rash.  Neurological: Negative for dizziness and weakness.  Endo/Heme/Allergies: Does not bruise/bleed easily.  Psychiatric/Behavioral: Negative for depression. The patient is not nervous/anxious.   All other systems reviewed and are negative.    Objective: BP (!) 115/59   Pulse 69   Temp (!) 97.5 F (36.4 C) (Oral)   Ht 5\' 5"  (1.651 m)   Wt 122 lb (55.3 kg)   BMI 20.30 kg/m  Physical Exam Vitals and nursing note reviewed.  Constitutional:      General: She is not in acute distress.    Appearance: Normal appearance. She is well-developed. She is not ill-appearing, toxic-appearing or diaphoretic.  HENT:     Head: Normocephalic and atraumatic.     Nose: No congestion or rhinorrhea.  Eyes:     General: No scleral icterus. Cardiovascular:     Rate and Rhythm: Normal rate and regular rhythm.     Heart sounds: Murmur heard.  Systolic murmur is present with a grade of 3/6.   Pulmonary:     Effort: Pulmonary effort is normal. No respiratory distress.     Breath sounds: Normal breath sounds.  Abdominal:     General: Bowel sounds are normal.     Palpations: Abdomen is soft. There is no hepatomegaly, splenomegaly or mass.     Tenderness: There is no abdominal tenderness. There is no guarding or rebound.     Hernia: No hernia is present.  Skin:    General: Skin is warm and dry.     Coloration: Skin is not jaundiced.     Findings: No rash.  Neurological:     General: No focal deficit present.     Mental Status: She is alert and oriented to person, place, and time.  Psychiatric:        Attention and Perception: Attention normal.        Mood and Affect: Mood normal.        Speech: Speech normal.        Behavior: Behavior normal.        Thought Content: Thought content normal.         Cognition and Memory: Cognition and memory normal.       08/26/2019 11:36 AM   Disclaimer: This note was dictated with voice recognition software. Similar sounding words can inadvertently be transcribed and may not be corrected upon review.

## 2019-08-26 NOTE — Assessment & Plan Note (Signed)
Chronic weight loss, although weight is objectively stable with in 3 pounds compared to her office visit 3 months ago.  Her facility person accompanying her indicates she generally likes to eat cereal for most meals.  She does like boost and Ensure.  I will recommend that they have her drink boost, Ensure, or other supplement 2-3 times a day to help bruit her nutritional status.  Follow-up in 3 months.

## 2019-08-26 NOTE — Assessment & Plan Note (Signed)
IBS symptoms currently well controlled.  Diarrhea and constipation doing well, no ongoing abdominal pain.  Recommend she continue her current medications and regimen.  Call for any worsening symptoms and follow-up in 3 months otherwise.

## 2019-08-30 DIAGNOSIS — E1142 Type 2 diabetes mellitus with diabetic polyneuropathy: Secondary | ICD-10-CM | POA: Diagnosis not present

## 2019-08-30 DIAGNOSIS — K219 Gastro-esophageal reflux disease without esophagitis: Secondary | ICD-10-CM | POA: Diagnosis not present

## 2019-09-02 ENCOUNTER — Ambulatory Visit (INDEPENDENT_AMBULATORY_CARE_PROVIDER_SITE_OTHER): Payer: Medicare Other | Admitting: *Deleted

## 2019-09-02 ENCOUNTER — Other Ambulatory Visit: Payer: Self-pay | Admitting: "Endocrinology

## 2019-09-02 DIAGNOSIS — Z8679 Personal history of other diseases of the circulatory system: Secondary | ICD-10-CM | POA: Diagnosis not present

## 2019-09-02 DIAGNOSIS — I82409 Acute embolism and thrombosis of unspecified deep veins of unspecified lower extremity: Secondary | ICD-10-CM

## 2019-09-02 DIAGNOSIS — Z5181 Encounter for therapeutic drug level monitoring: Secondary | ICD-10-CM

## 2019-09-02 LAB — POCT INR: INR: 3.6 — AB (ref 2.0–3.0)

## 2019-09-02 NOTE — Patient Instructions (Signed)
Hold warfarin tonight then decrease dose to 1 tablet daily except 1/2 tablet on Mondays and Thursdays Recheck in 5 weeks Orders written for [facility] [phone]

## 2019-09-08 DIAGNOSIS — M48061 Spinal stenosis, lumbar region without neurogenic claudication: Secondary | ICD-10-CM | POA: Diagnosis not present

## 2019-09-08 DIAGNOSIS — F209 Schizophrenia, unspecified: Secondary | ICD-10-CM | POA: Diagnosis not present

## 2019-09-08 DIAGNOSIS — M4316 Spondylolisthesis, lumbar region: Secondary | ICD-10-CM | POA: Diagnosis not present

## 2019-09-08 DIAGNOSIS — I69354 Hemiplegia and hemiparesis following cerebral infarction affecting left non-dominant side: Secondary | ICD-10-CM | POA: Diagnosis not present

## 2019-09-08 DIAGNOSIS — M21372 Foot drop, left foot: Secondary | ICD-10-CM | POA: Diagnosis not present

## 2019-09-08 DIAGNOSIS — E1151 Type 2 diabetes mellitus with diabetic peripheral angiopathy without gangrene: Secondary | ICD-10-CM | POA: Diagnosis not present

## 2019-09-09 DIAGNOSIS — E1165 Type 2 diabetes mellitus with hyperglycemia: Secondary | ICD-10-CM | POA: Diagnosis not present

## 2019-09-09 DIAGNOSIS — N390 Urinary tract infection, site not specified: Secondary | ICD-10-CM | POA: Diagnosis not present

## 2019-09-09 DIAGNOSIS — E1142 Type 2 diabetes mellitus with diabetic polyneuropathy: Secondary | ICD-10-CM | POA: Diagnosis not present

## 2019-09-09 DIAGNOSIS — Z0001 Encounter for general adult medical examination with abnormal findings: Secondary | ICD-10-CM | POA: Diagnosis not present

## 2019-09-09 DIAGNOSIS — I10 Essential (primary) hypertension: Secondary | ICD-10-CM | POA: Diagnosis not present

## 2019-09-09 DIAGNOSIS — Z20828 Contact with and (suspected) exposure to other viral communicable diseases: Secondary | ICD-10-CM | POA: Diagnosis not present

## 2019-09-09 DIAGNOSIS — G8190 Hemiplegia, unspecified affecting unspecified side: Secondary | ICD-10-CM | POA: Diagnosis not present

## 2019-09-09 DIAGNOSIS — U071 COVID-19: Secondary | ICD-10-CM | POA: Diagnosis not present

## 2019-09-09 NOTE — Telephone Encounter (Signed)
Butch Penny called from [facility] in regards to getting patients easy test strips refilled and states it has been denied a couple times. Requesting call back [phone]

## 2019-09-09 NOTE — Telephone Encounter (Signed)
Discussed with Butch Penny from [facility] that pt has not been seen in over 6 months and has not had a HgbA1c in over a year. She stated she would make [facility] aware so they will call back to schedule an appointment.

## 2019-09-10 DIAGNOSIS — M21372 Foot drop, left foot: Secondary | ICD-10-CM | POA: Diagnosis not present

## 2019-09-10 DIAGNOSIS — E1151 Type 2 diabetes mellitus with diabetic peripheral angiopathy without gangrene: Secondary | ICD-10-CM | POA: Diagnosis not present

## 2019-09-10 DIAGNOSIS — M4316 Spondylolisthesis, lumbar region: Secondary | ICD-10-CM | POA: Diagnosis not present

## 2019-09-10 DIAGNOSIS — F209 Schizophrenia, unspecified: Secondary | ICD-10-CM | POA: Diagnosis not present

## 2019-09-10 DIAGNOSIS — I69354 Hemiplegia and hemiparesis following cerebral infarction affecting left non-dominant side: Secondary | ICD-10-CM | POA: Diagnosis not present

## 2019-09-10 DIAGNOSIS — M48061 Spinal stenosis, lumbar region without neurogenic claudication: Secondary | ICD-10-CM | POA: Diagnosis not present

## 2019-09-16 DIAGNOSIS — Z20828 Contact with and (suspected) exposure to other viral communicable diseases: Secondary | ICD-10-CM | POA: Diagnosis not present

## 2019-09-16 DIAGNOSIS — R278 Other lack of coordination: Secondary | ICD-10-CM | POA: Diagnosis not present

## 2019-09-16 DIAGNOSIS — U071 COVID-19: Secondary | ICD-10-CM | POA: Diagnosis not present

## 2019-09-16 DIAGNOSIS — G8194 Hemiplegia, unspecified affecting left nondominant side: Secondary | ICD-10-CM | POA: Diagnosis not present

## 2019-09-17 DIAGNOSIS — Z20828 Contact with and (suspected) exposure to other viral communicable diseases: Secondary | ICD-10-CM | POA: Diagnosis not present

## 2019-09-17 DIAGNOSIS — U071 COVID-19: Secondary | ICD-10-CM | POA: Diagnosis not present

## 2019-09-18 DIAGNOSIS — Z20828 Contact with and (suspected) exposure to other viral communicable diseases: Secondary | ICD-10-CM | POA: Diagnosis not present

## 2019-09-18 DIAGNOSIS — U071 COVID-19: Secondary | ICD-10-CM | POA: Diagnosis not present

## 2019-09-18 DIAGNOSIS — R278 Other lack of coordination: Secondary | ICD-10-CM | POA: Diagnosis not present

## 2019-09-18 DIAGNOSIS — G8194 Hemiplegia, unspecified affecting left nondominant side: Secondary | ICD-10-CM | POA: Diagnosis not present

## 2019-09-23 DIAGNOSIS — G8194 Hemiplegia, unspecified affecting left nondominant side: Secondary | ICD-10-CM | POA: Diagnosis not present

## 2019-09-23 DIAGNOSIS — U071 COVID-19: Secondary | ICD-10-CM | POA: Diagnosis not present

## 2019-09-23 DIAGNOSIS — Z20828 Contact with and (suspected) exposure to other viral communicable diseases: Secondary | ICD-10-CM | POA: Diagnosis not present

## 2019-09-23 DIAGNOSIS — R278 Other lack of coordination: Secondary | ICD-10-CM | POA: Diagnosis not present

## 2019-09-25 DIAGNOSIS — U071 COVID-19: Secondary | ICD-10-CM | POA: Diagnosis not present

## 2019-09-25 DIAGNOSIS — Z20828 Contact with and (suspected) exposure to other viral communicable diseases: Secondary | ICD-10-CM | POA: Diagnosis not present

## 2019-09-25 DIAGNOSIS — E1142 Type 2 diabetes mellitus with diabetic polyneuropathy: Secondary | ICD-10-CM | POA: Diagnosis not present

## 2019-09-25 DIAGNOSIS — M545 Low back pain: Secondary | ICD-10-CM | POA: Diagnosis not present

## 2019-09-25 DIAGNOSIS — R2681 Unsteadiness on feet: Secondary | ICD-10-CM | POA: Diagnosis not present

## 2019-09-25 DIAGNOSIS — I951 Orthostatic hypotension: Secondary | ICD-10-CM | POA: Diagnosis not present

## 2019-09-25 DIAGNOSIS — M6281 Muscle weakness (generalized): Secondary | ICD-10-CM | POA: Diagnosis not present

## 2019-09-25 DIAGNOSIS — G8190 Hemiplegia, unspecified affecting unspecified side: Secondary | ICD-10-CM | POA: Diagnosis not present

## 2019-09-25 DIAGNOSIS — E43 Unspecified severe protein-calorie malnutrition: Secondary | ICD-10-CM | POA: Diagnosis not present

## 2019-09-26 DIAGNOSIS — G8194 Hemiplegia, unspecified affecting left nondominant side: Secondary | ICD-10-CM | POA: Diagnosis not present

## 2019-09-26 DIAGNOSIS — R278 Other lack of coordination: Secondary | ICD-10-CM | POA: Diagnosis not present

## 2019-09-26 DIAGNOSIS — M6281 Muscle weakness (generalized): Secondary | ICD-10-CM | POA: Diagnosis not present

## 2019-09-26 DIAGNOSIS — M545 Low back pain: Secondary | ICD-10-CM | POA: Diagnosis not present

## 2019-09-26 DIAGNOSIS — R2681 Unsteadiness on feet: Secondary | ICD-10-CM | POA: Diagnosis not present

## 2019-09-30 DIAGNOSIS — M6281 Muscle weakness (generalized): Secondary | ICD-10-CM | POA: Diagnosis not present

## 2019-09-30 DIAGNOSIS — R2681 Unsteadiness on feet: Secondary | ICD-10-CM | POA: Diagnosis not present

## 2019-09-30 DIAGNOSIS — M545 Low back pain: Secondary | ICD-10-CM | POA: Diagnosis not present

## 2019-09-30 DIAGNOSIS — U071 COVID-19: Secondary | ICD-10-CM | POA: Diagnosis not present

## 2019-09-30 DIAGNOSIS — Z20828 Contact with and (suspected) exposure to other viral communicable diseases: Secondary | ICD-10-CM | POA: Diagnosis not present

## 2019-10-01 DIAGNOSIS — U071 COVID-19: Secondary | ICD-10-CM | POA: Diagnosis not present

## 2019-10-01 DIAGNOSIS — G8194 Hemiplegia, unspecified affecting left nondominant side: Secondary | ICD-10-CM | POA: Diagnosis not present

## 2019-10-01 DIAGNOSIS — R278 Other lack of coordination: Secondary | ICD-10-CM | POA: Diagnosis not present

## 2019-10-01 DIAGNOSIS — Z20828 Contact with and (suspected) exposure to other viral communicable diseases: Secondary | ICD-10-CM | POA: Diagnosis not present

## 2019-10-01 NOTE — Progress Notes (Signed)
Virtual Visit via Telephone Note  I connected with [name] on 10/08/19 at  8:40 AM EDT by telephone and verified that I am speaking with the correct person using two identifiers.   I discussed the limitations, risks, security and privacy concerns of performing an evaluation and management service by telephone and the availability of in person appointments. I also discussed with the patient that there may be a patient responsible charge related to this service. The patient expressed understanding and agreed to proceed.    I discussed the assessment and treatment plan with the patient. The patient was provided an opportunity to ask questions and all were answered. The patient agreed with the plan and demonstrated an understanding of the instructions.   The patient was advised to call back or seek an in-person evaluation if the symptoms worsen or if the condition fails to improve as anticipated.  Location: patient- group home, provider- home office   I provided 18 minutes of non-face-to-face time during this encounter.   [name], MD    [facility] MD/PA/NP OP Progress Note  10/08/2019 9:08 AM [name]  MRN:  [id number]  Chief Complaint:  Chief Complaint    Other; Follow-up     HPI:  This is a follow-up appointment for schizophrenia.  She states that she has been doing good.  However, when she is asked about this morning, she states that she feels nervous without any reason.  She has been feeling this way for a while.  She has decreased appetite.  She enjoys watching TV program, and does exercise which she learned from PT, stating that she cannot walk due to stroke.  She reports good relationship with her daughter and her granddaughter.  She sleeps well.  She has fair energy.  She denies SI.  She denies AH, VH, paranoia.  She denies ideas of reference.   [name], staff at [facility] presents to the interview.  [name] states that [name] has been doing well since the last visit.   Although there is some moment of her having verbal outburst, it is not frequent as much compared to before. She comes to dinning room and eats slightly better under the supervision of the staff. [name] denies any safety concern. She takes medication regularly. [name] is not aware of any noticeable tremor .    Activities of Daily Living (ADLs):  [name] dependent in the following:  feeding,  Dependent- bathing and hygiene,  grooming and toileting, continence, in a wheelchair   Employment: unemployed. used to work for [employer], on disability around ten years ago after her back surg Marital status: Widow, her husband deceased after 78 years of marriage in 2010 Number of children: 83, 71 year old daughter. She has a granddaughter  Visit Diagnosis:    ICD-10-CM   1. Mild episode of recurrent major depressive disorder ([place])  F33.0   2. Schizophrenia, unspecified type ([place])  F20.9 risperiDONE (RISPERDAL) 1 MG tablet    ABILIFY 2 MG tablet    Past Psychiatric History: Please see initial evaluation for full details. I have reviewed the history. No updates at this time.     Past Medical History:  Past Medical History:  Diagnosis Date  . Anxiety   . Arthritis   . COVID-19 virus infection    COVID-19+ approx 01/24/19; asymptomatic course with full recovery  . Dependence on wheelchair    pivot/transfers  . Depression    History of psychosis and previous suicide attempt  .  DVT, lower extremity, recurrent (HCC)    Long-term Coumadin per Dr. Legrand Rams  . Essential hypertension   . GERD (gastroesophageal reflux disease)   . Hemiplegia ([place]) 2010   Left side  . History of stroke    Acute infarct and right cerebral white matter small vessel disease 12/10  . Leg DVT (deep venous thromboembolism), acute ([place]) 2006  . Schizophrenia ([place])   . Stroke [facility])    left sided weakness  . Type 2 diabetes mellitus ([place])     Past Surgical History:  Procedure Laterality Date  . BACK SURGERY     . BIOPSY N/A 11/24/2014   Procedure: BIOPSY;  Surgeon: Danie Binder, MD;  Location: [facility] ORS;  Service: Endoscopy;  Laterality: N/A;  . BIOPSY  05/17/2016   Procedure: BIOPSY;  Surgeon: Danie Binder, MD;  Location: [facility] ENDO SUITE;  Service: Endoscopy;;  gastric biopsy  . COLONOSCOPY WITH PROPOFOL N/A 11/24/2014   Dr. Rudie Meyer polyps removed/moderate sized internal hemorrhoids, tubular adenomas. Next surveillance in 3 years  . ESOPHAGOGASTRODUODENOSCOPY (EGD) WITH PROPOFOL N/A 11/24/2014   Dr. Clayburn Pert HH/patent stricture at the gastroesophageal junction, mild non-erosive gastritis, path negative for H.pylori or celiac sprue  . ESOPHAGOGASTRODUODENOSCOPY (EGD) WITH PROPOFOL N/A 05/17/2016   Procedure: ESOPHAGOGASTRODUODENOSCOPY (EGD) WITH PROPOFOL;  Surgeon: Danie Binder, MD;  Location: [facility] ENDO SUITE;  Service: Endoscopy;  Laterality: N/A;  12:45pm  . GIVENS CAPSULE STUDY N/A 12/11/2014   MULTILPLE EROSION IN the stomach WITH ACTIVE OOZING. OCCASIONAL EROSIONS AND RARE ULCER SEEN IN PROXIMAL SMALL BOWEL . No masses or AVMs SEEN. NO OLD BLOOD OR FRESH BLOOD SEEN.   . POLYPECTOMY N/A 11/24/2014   Procedure: POLYPECTOMY;  Surgeon: Danie Binder, MD;  Location: [facility] ORS;  Service: Endoscopy;  Laterality: N/A;  . SAVORY DILATION N/A 05/17/2016   Procedure: SAVORY DILATION;  Surgeon: Danie Binder, MD;  Location: [facility] ENDO SUITE;  Service: Endoscopy;  Laterality: N/A;    Family Psychiatric History: Please see initial evaluation for full details. I have reviewed the history. No updates at this time.     Family History:  Family History  Problem Relation Age of Onset  . Hypertension Mother   . Colon cancer Neg Hx     Social History:  Social History   Socioeconomic History  . Marital status: Widowed    Spouse name: Not on file  . Number of children: Not on file  . Years of education: Not on file  . Highest education level: Not on file  Occupational History  . Not on file  Tobacco Use  .  Smoking status: Former Smoker    Packs/day: 0.25    Years: 20.00    Pack years: 5.00    Types: Cigarettes    Quit date: 01/24/1995    Years since quitting: 24.7  . Smokeless tobacco: Never Used  Vaping Use  . Vaping Use: Never used  Substance and Sexual Activity  . Alcohol use: No    Alcohol/week: 0.0 standard drinks  . Drug use: No  . Sexual activity: Never    Birth control/protection: Post-menopausal  Other Topics Concern  . Not on file  Social History Narrative  . Not on file   Social Determinants of Health   Financial Resource Strain:   . Difficulty of Paying Living Expenses: Not on file  Food Insecurity:   . Worried About Charity fundraiser in the Last Year: Not on file  . Ran Out of Food in the Last  Year: Not on file  Transportation Needs:   . Lack of Transportation (Medical): Not on file  . Lack of Transportation (Non-Medical): Not on file  Physical Activity:   . Days of Exercise per Week: Not on file  . Minutes of Exercise per Session: Not on file  Stress:   . Feeling of Stress : Not on file  Social Connections:   . Frequency of Communication with Friends and Family: Not on file  . Frequency of Social Gatherings with Friends and Family: Not on file  . Attends Religious Services: Not on file  . Active Member of Clubs or Organizations: Not on file  . Attends Archivist Meetings: Not on file  . Marital Status: Not on file    Allergies:  Allergies  Allergen Reactions  . Sulfa Antibiotics Rash    Metabolic Disorder Labs: Lab Results  Component Value Date   HGBA1C 10.6 (H) 10/05/2019   MPG 257.52 10/05/2019   MPG 254.65 10/04/2019   No results found for: PROLACTIN Lab Results  Component Value Date   CHOL 151 10/05/2019   TRIG 149 10/05/2019   HDL 40 (L) 10/05/2019   CHOLHDL 3.8 10/05/2019   VLDL 30 10/05/2019   LDLCALC 81 10/05/2019   LDLCALC 55 10/07/2016   Lab Results  Component Value Date   TSH 0.578 10/05/2019   TSH 1.138  03/29/2017    Therapeutic Level Labs: No results found for: LITHIUM No results found for: VALPROATE No components found for:  CBMZ  Current Medications: Current Outpatient Medications  Medication Sig Dispense Refill  . [START ON 10/19/2019] ABILIFY 2 MG tablet Take 1 tablet (2 mg total) by mouth daily. 28 tablet 2  . acetaminophen (TYLENOL) 325 MG tablet Take 650 mg by mouth 3 (three) times daily as needed for mild pain or moderate pain.     . calcium carbonate (TUMS - DOSED IN MG ELEMENTAL CALCIUM) 500 MG chewable tablet Chew 1 tablet by mouth 4 (four) times daily as needed for indigestion or heartburn.     . cholecalciferol (VITAMIN D) 1000 units tablet Take 2,000 Units by mouth daily.    . clonazePAM (KLONOPIN) 0.5 MG tablet Take 1 tablet (0.5 mg total) by mouth 3 (three) times daily. 90 tablet 2  . docusate sodium (COLACE) 100 MG capsule Take 200 mg by mouth at bedtime.     . DULoxetine (CYMBALTA) 60 MG capsule Take 120 mg by mouth daily.     . fluticasone (FLONASE) 50 MCG/ACT nasal spray Place 2 sprays into both nostrils daily as needed for allergies or rhinitis.    Marland Kitchen gabapentin (NEURONTIN) 300 MG capsule Take 300 mg by mouth 3 (three) times daily.    Marland Kitchen HYDROcodone-acetaminophen (NORCO) 10-325 MG tablet Take 1 tablet by mouth in the morning, at noon, and at bedtime.     . hydrocortisone (ANUSOL-HC) 2.5 % rectal cream Place 1 application rectally 2 (two) times daily as needed.     . iron polysaccharides (NIFEREX) 150 MG capsule Take 1 capsule (150 mg total) by mouth daily. 30 capsule 11  . latanoprost (XALATAN) 0.005 % ophthalmic solution Place 1 drop into both eyes at bedtime.    Marland Kitchen LEVEMIR FLEXTOUCH 100 UNIT/ML Pen INJECT 28 UNITS INTO THE SKIN AT BEDTIME. 15 mL 3  . loratadine (CLARITIN) 10 MG tablet Take 10 mg by mouth daily.    Marland Kitchen lubiprostone (AMITIZA) 24 MCG capsule Take 1 capsule (24 mcg total) by mouth 2 (two) times daily with a  meal. 60 capsule 5  . metoprolol succinate  (TOPROL-XL) 25 MG 24 hr tablet Take 1 tablet (25 mg total) by mouth 2 (two) times daily. For Heart 60 tablet 5  . midodrine (PROAMATINE) 5 MG tablet Take 1 tablet (5 mg total) by mouth 3 (three) times daily with meals. 90 tablet 5  . mirabegron ER (MYRBETRIQ) 25 MG TB24 tablet Take 25 mg by mouth daily.    . mirtazapine (REMERON) 30 MG tablet Take 30 mg by mouth at bedtime.    . ondansetron (ZOFRAN) 4 MG tablet Take 1 tablet (4 mg total) by mouth 4 (four) times daily -  before meals and at bedtime. (Patient taking differently: Take 4 mg by mouth 2 (two) times daily as needed for nausea or vomiting. ) 120 tablet 3  . pantoprazole (PROTONIX) 40 MG tablet Take 1 tablet (40 mg total) by mouth 2 (two) times daily before a meal. 270 tablet 3  . phenol (CHLORASEPTIC) 1.4 % LIQD Use as directed 1 spray in the mouth or throat every 6 (six) hours as needed for throat irritation / pain.    . polyethylene glycol (MIRALAX / GLYCOLAX) 17 g packet Take 17 g by mouth 2 (two) times daily as needed for mild constipation or moderate constipation.    Derrill Memo ON 10/18/2019] risperiDONE (RISPERDAL) 1 MG tablet Take 1 tablet (1 mg total) by mouth in the morning and at bedtime. 56 tablet 2  . simvastatin (ZOCOR) 20 MG tablet Take 20 mg by mouth at bedtime.     Marland Kitchen tiZANidine (ZANAFLEX) 2 MG tablet Take 2 mg by mouth daily.    Marland Kitchen warfarin (COUMADIN) 5 MG tablet Take 1 tablet (5 mg total) by mouth every evening. 30 tablet 6   No current facility-administered medications for this visit.     Musculoskeletal: Strength & Muscle Tone: [place]: N?A Patient leans: N/A  Psychiatric Specialty Exam: Review of Systems  Psychiatric/Behavioral: Negative for agitation, behavioral problems, confusion, decreased concentration, dysphoric mood, hallucinations, self-injury, sleep disturbance and suicidal ideas. The patient is nervous/anxious. The patient is not hyperactive.   All other systems reviewed and are negative.    There were no vitals taken for this visit.There is no height or weight on file to calculate BMI.  General Appearance: NA  Eye Contact:  NA  Speech:  Clear and Coherent  Volume:  Normal  Mood:  Anxious  Affect:  NA  Thought Process:  Coherent  Orientation:  Full (Time, Place, and Person)  Thought Content: Logical   Suicidal Thoughts:  No  Homicidal Thoughts:  No  Memory:  Immediate;   Good  Judgement:  Fair  Insight:  Shallow  Psychomotor Activity:  Normal  Concentration:  Concentration: Fair and Attention Span: Fair  Recall:  Poor  Fund of Knowledge: Fair  Language: Good  Akathisia:  No  Handed:  Right  AIMS (if indicated): not done  Assets:  Social Support  ADL's:  Intact  Cognition: WNL  Sleep:  Good   Screenings: PHQ2-9     Office Visit from 11/28/2017 in [facility] Office Visit from 09/25/2017 in [facility] Office Visit from 03/28/2017 in [facility] Office Visit from 09/13/2016 in [facility] Office Visit from 06/07/2016 in [facility]  PHQ-2 Total Score 0 0 0 0 0       Assessment and Plan:  ANYE BROSE is a 74 y.o. year old female with a history of schizophrenia, depression,SVT,  DVT, history of stroke on warfarin, type II diabetes, hypertension, GERD , who presents for follow up appointment for below.   1. Schizophrenia, unspecified type ([place] 2. Mild episode of recurrent major depressive disorder (HCC) There has been improvement in verbal aggression since up titration of risperidone.  Will continue current dose to target schizophrenia.  Discussed potential metabolic side effect and EPS. Noted that she had worsening in withdrawal symptoms and verbal aggression in the context of tapering off Risperdal.  Will continue Abilify to target schizophrenia; will consider tapering off this medication at the next visit to avoid polypharmacy.     Plan 1.  ContinueAbilify2 mg daily 2. Continue Risperidone 1 mg twice a day  3.Next appointment: 12/8 at 11 AM for 30 mins phone - Fax AVS to [phone] - onclonazepam 0.5 mg three timesa day for anxiety (prescribed by Dr. Merlene Laughter) - on mirtazapine 30 mg at night, prescribed by Dr. Legrand Rams - onduloxetine 120 mg daily(prescribed byDr. Legrand Rams.)  - onZolpidem 10 mg at night- prescribed by Dr. Legrand Rams  -on gabapentin 300 mg TID   The patient demonstrates the following risk factors for suicide: Chronic risk factors for suicide include:psychiatric disorder ofschizophrenia by history, depression. Acute risk factorsfor suicide include: unemployment. Protective factorsfor this patient include: positive social support and hope for the future. Considering these factors, the overall suicide risk at this point appears to below. Patientisappropriate for outpatient follow up.  [name], MD 10/08/2019, 9:08 AM

## 2019-10-02 DIAGNOSIS — R2681 Unsteadiness on feet: Secondary | ICD-10-CM | POA: Diagnosis not present

## 2019-10-02 DIAGNOSIS — Z20828 Contact with and (suspected) exposure to other viral communicable diseases: Secondary | ICD-10-CM | POA: Diagnosis not present

## 2019-10-02 DIAGNOSIS — M6281 Muscle weakness (generalized): Secondary | ICD-10-CM | POA: Diagnosis not present

## 2019-10-02 DIAGNOSIS — M79674 Pain in right toe(s): Secondary | ICD-10-CM | POA: Diagnosis not present

## 2019-10-02 DIAGNOSIS — M79671 Pain in right foot: Secondary | ICD-10-CM | POA: Diagnosis not present

## 2019-10-02 DIAGNOSIS — M545 Low back pain: Secondary | ICD-10-CM | POA: Diagnosis not present

## 2019-10-02 DIAGNOSIS — M79675 Pain in left toe(s): Secondary | ICD-10-CM | POA: Diagnosis not present

## 2019-10-02 DIAGNOSIS — I739 Peripheral vascular disease, unspecified: Secondary | ICD-10-CM | POA: Diagnosis not present

## 2019-10-02 DIAGNOSIS — E1140 Type 2 diabetes mellitus with diabetic neuropathy, unspecified: Secondary | ICD-10-CM | POA: Diagnosis not present

## 2019-10-02 DIAGNOSIS — U071 COVID-19: Secondary | ICD-10-CM | POA: Diagnosis not present

## 2019-10-02 DIAGNOSIS — M79672 Pain in left foot: Secondary | ICD-10-CM | POA: Diagnosis not present

## 2019-10-03 DIAGNOSIS — R278 Other lack of coordination: Secondary | ICD-10-CM | POA: Diagnosis not present

## 2019-10-03 DIAGNOSIS — G8194 Hemiplegia, unspecified affecting left nondominant side: Secondary | ICD-10-CM | POA: Diagnosis not present

## 2019-10-04 ENCOUNTER — Observation Stay (HOSPITAL_COMMUNITY)
Admission: EM | Admit: 2019-10-04 | Discharge: 2019-10-05 | Disposition: A | Discharge status: Home / Self Care | Payer: Medicare Other | Attending: Family Medicine | Admitting: Family Medicine

## 2019-10-04 ENCOUNTER — Emergency Department (HOSPITAL_COMMUNITY): Payer: Medicare Other

## 2019-10-04 DIAGNOSIS — I82409 Acute embolism and thrombosis of unspecified deep veins of unspecified lower extremity: Secondary | ICD-10-CM | POA: Diagnosis present

## 2019-10-04 DIAGNOSIS — Z79899 Other long term (current) drug therapy: Secondary | ICD-10-CM | POA: Insufficient documentation

## 2019-10-04 DIAGNOSIS — I110 Hypertensive heart disease with heart failure: Secondary | ICD-10-CM | POA: Insufficient documentation

## 2019-10-04 DIAGNOSIS — E6609 Other obesity due to excess calories: Secondary | ICD-10-CM | POA: Diagnosis present

## 2019-10-04 DIAGNOSIS — K219 Gastro-esophageal reflux disease without esophagitis: Secondary | ICD-10-CM | POA: Diagnosis present

## 2019-10-04 DIAGNOSIS — Z794 Long term (current) use of insulin: Secondary | ICD-10-CM | POA: Diagnosis present

## 2019-10-04 DIAGNOSIS — Z20822 Contact with and (suspected) exposure to covid-19: Secondary | ICD-10-CM | POA: Diagnosis not present

## 2019-10-04 DIAGNOSIS — Z87891 Personal history of nicotine dependence: Secondary | ICD-10-CM | POA: Diagnosis not present

## 2019-10-04 DIAGNOSIS — F209 Schizophrenia, unspecified: Secondary | ICD-10-CM

## 2019-10-04 DIAGNOSIS — I959 Hypotension, unspecified: Secondary | ICD-10-CM | POA: Diagnosis not present

## 2019-10-04 DIAGNOSIS — I951 Orthostatic hypotension: Secondary | ICD-10-CM | POA: Diagnosis not present

## 2019-10-04 DIAGNOSIS — E1159 Type 2 diabetes mellitus with other circulatory complications: Secondary | ICD-10-CM | POA: Diagnosis not present

## 2019-10-04 DIAGNOSIS — I10 Essential (primary) hypertension: Secondary | ICD-10-CM | POA: Diagnosis present

## 2019-10-04 DIAGNOSIS — I6930 Unspecified sequelae of cerebral infarction: Secondary | ICD-10-CM | POA: Diagnosis not present

## 2019-10-04 DIAGNOSIS — Z7901 Long term (current) use of anticoagulants: Secondary | ICD-10-CM | POA: Diagnosis not present

## 2019-10-04 DIAGNOSIS — E119 Type 2 diabetes mellitus without complications: Secondary | ICD-10-CM | POA: Diagnosis present

## 2019-10-04 DIAGNOSIS — R457 State of emotional shock and stress, unspecified: Secondary | ICD-10-CM | POA: Diagnosis not present

## 2019-10-04 DIAGNOSIS — I4891 Unspecified atrial fibrillation: Secondary | ICD-10-CM | POA: Diagnosis not present

## 2019-10-04 DIAGNOSIS — I499 Cardiac arrhythmia, unspecified: Secondary | ICD-10-CM | POA: Diagnosis not present

## 2019-10-04 DIAGNOSIS — I5032 Chronic diastolic (congestive) heart failure: Secondary | ICD-10-CM | POA: Insufficient documentation

## 2019-10-04 DIAGNOSIS — R0689 Other abnormalities of breathing: Secondary | ICD-10-CM | POA: Diagnosis not present

## 2019-10-04 DIAGNOSIS — E1165 Type 2 diabetes mellitus with hyperglycemia: Secondary | ICD-10-CM | POA: Diagnosis present

## 2019-10-04 DIAGNOSIS — R0602 Shortness of breath: Secondary | ICD-10-CM | POA: Diagnosis not present

## 2019-10-04 LAB — CBC
HCT: 37.6 % (ref 36.0–46.0)
Hemoglobin: 12.4 g/dL (ref 12.0–15.0)
MCH: 29.7 pg (ref 26.0–34.0)
MCHC: 33 g/dL (ref 30.0–36.0)
MCV: 90 fL (ref 80.0–100.0)
Platelets: 241 10*3/uL (ref 150–400)
RBC: 4.18 MIL/uL (ref 3.87–5.11)
RDW: 12.3 % (ref 11.5–15.5)
WBC: 6.5 10*3/uL (ref 4.0–10.5)
nRBC: 0 % (ref 0.0–0.2)

## 2019-10-04 LAB — TROPONIN I (HIGH SENSITIVITY)
Troponin I (High Sensitivity): 10 ng/L (ref <18)
Troponin I (High Sensitivity): 10 ng/L (ref <18)

## 2019-10-04 LAB — BRAIN NATRIURETIC PEPTIDE: B Natriuretic Peptide: 311 pg/mL — ABNORMAL HIGH (ref 0.0–100.0)

## 2019-10-04 LAB — D-DIMER, QUANTITATIVE: D-Dimer, Quant: <0.27 ug/mL-FEU (ref 0.00–0.50)

## 2019-10-04 LAB — COMPREHENSIVE METABOLIC PANEL
ALT: 10 U/L (ref 0–44)
AST: 11 U/L — ABNORMAL LOW (ref 15–41)
Albumin: 3.6 g/dL (ref 3.5–5.0)
Alkaline Phosphatase: 61 U/L (ref 38–126)
Anion gap: 8 (ref 5–15)
BUN: 15 mg/dL (ref 8–23)
CO2: 28 mmol/L (ref 22–32)
Calcium: 8.5 mg/dL — ABNORMAL LOW (ref 8.9–10.3)
Chloride: 99 mmol/L (ref 98–111)
Creatinine, Ser: 0.61 mg/dL (ref 0.44–1.00)
GFR calc Af Amer: >60 mL/min (ref >60)
GFR calc non Af Amer: >60 mL/min (ref >60)
Glucose, Bld: 314 mg/dL — ABNORMAL HIGH (ref 70–99)
Potassium: 4 mmol/L (ref 3.5–5.1)
Sodium: 135 mmol/L (ref 135–145)
Total Bilirubin: 0.5 mg/dL (ref 0.3–1.2)
Total Protein: 6.1 g/dL — ABNORMAL LOW (ref 6.5–8.1)

## 2019-10-04 LAB — SARS CORONAVIRUS 2 BY RT PCR (HOSPITAL ORDER, PERFORMED IN ~~LOC~~ HOSPITAL LAB): SARS Coronavirus 2: NEGATIVE

## 2019-10-04 LAB — CBG MONITORING, ED
Glucose-Capillary: 169 mg/dL — ABNORMAL HIGH (ref 70–99)
Glucose-Capillary: 70 mg/dL (ref 70–99)
Glucose-Capillary: 307 mg/dL — ABNORMAL HIGH (ref 70–99)

## 2019-10-04 LAB — PROTIME-INR
INR: 1.3 — ABNORMAL HIGH (ref 0.8–1.2)
Prothrombin Time: 16 seconds — ABNORMAL HIGH (ref 11.4–15.2)

## 2019-10-04 MED ORDER — METOPROLOL TARTRATE 5 MG/5ML IV SOLN
5.0000 mg | Freq: Once | INTRAVENOUS | Status: AC
  Administered 2019-10-04: 5 mg via INTRAVENOUS
  Filled 2019-10-04: qty 5

## 2019-10-04 MED ORDER — INSULIN ASPART 100 UNIT/ML ~~LOC~~ SOLN
0.0000 [IU] | Freq: Every day | SUBCUTANEOUS | Status: DC
  Administered 2019-10-04: 4 [IU] via SUBCUTANEOUS
  Filled 2019-10-04: qty 1

## 2019-10-04 MED ORDER — INSULIN ASPART 100 UNIT/ML ~~LOC~~ SOLN
12.0000 [IU] | Freq: Once | SUBCUTANEOUS | Status: AC
  Administered 2019-10-04: 12 [IU] via SUBCUTANEOUS
  Filled 2019-10-04: qty 1

## 2019-10-04 MED ORDER — WARFARIN SODIUM 7.5 MG PO TABS
7.5000 mg | ORAL_TABLET | Freq: Once | ORAL | Status: AC
  Administered 2019-10-04: 7.5 mg via ORAL
  Filled 2019-10-04 (×2): qty 1

## 2019-10-04 MED ORDER — MIRABEGRON ER 25 MG PO TB24
25.0000 mg | ORAL_TABLET | Freq: Every day | ORAL | Status: DC
  Filled 2019-10-04 (×2): qty 1

## 2019-10-04 MED ORDER — POLYETHYLENE GLYCOL 3350 17 G PO PACK: 17.0000 g | PACK | Freq: Two times a day (BID) | ORAL | Status: DC | PRN

## 2019-10-04 MED ORDER — GABAPENTIN 300 MG PO CAPS
300.0000 mg | ORAL_CAPSULE | Freq: Three times a day (TID) | ORAL | Status: DC
  Administered 2019-10-04 – 2019-10-05 (×2): 300 mg via ORAL
  Filled 2019-10-04 (×2): qty 1

## 2019-10-04 MED ORDER — INSULIN ASPART 100 UNIT/ML ~~LOC~~ SOLN
0.0000 [IU] | Freq: Three times a day (TID) | SUBCUTANEOUS | Status: DC
  Administered 2019-10-05: 3 [IU] via SUBCUTANEOUS
  Filled 2019-10-04: qty 1

## 2019-10-04 MED ORDER — DILTIAZEM HCL-DEXTROSE 125-5 MG/125ML-% IV SOLN (PREMIX)
5.0000 mg/h | INTRAVENOUS | Status: DC
  Administered 2019-10-04: 5 mg/h via INTRAVENOUS
  Filled 2019-10-04: qty 125

## 2019-10-04 MED ORDER — CLONAZEPAM 0.5 MG PO TABS
0.5000 mg | ORAL_TABLET | Freq: Once | ORAL | Status: AC
  Administered 2019-10-04: 0.5 mg via ORAL
  Filled 2019-10-04: qty 1

## 2019-10-04 MED ORDER — ENOXAPARIN SODIUM 80 MG/0.8ML ~~LOC~~ SOLN
1.5000 mg/kg | SUBCUTANEOUS | Status: DC
  Administered 2019-10-04: 80 mg via SUBCUTANEOUS
  Filled 2019-10-04: qty 0.8

## 2019-10-04 MED ORDER — MIRTAZAPINE 15 MG PO TABS
30.0000 mg | ORAL_TABLET | Freq: Every day | ORAL | Status: DC
  Administered 2019-10-04: 30 mg via ORAL
  Filled 2019-10-04: qty 2

## 2019-10-04 MED ORDER — LORATADINE 10 MG PO TABS
10.0000 mg | ORAL_TABLET | Freq: Every day | ORAL | Status: DC
  Administered 2019-10-05: 10 mg via ORAL
  Filled 2019-10-04: qty 1

## 2019-10-04 MED ORDER — MIDODRINE HCL 5 MG PO TABS
5.0000 mg | ORAL_TABLET | Freq: Two times a day (BID) | ORAL | Status: DC
  Administered 2019-10-05: 5 mg via ORAL
  Filled 2019-10-04 (×3): qty 1

## 2019-10-04 MED ORDER — CLONAZEPAM 0.5 MG PO TABS
0.5000 mg | ORAL_TABLET | Freq: Three times a day (TID) | ORAL | Status: DC
  Administered 2019-10-04 – 2019-10-05 (×2): 0.5 mg via ORAL
  Filled 2019-10-04 (×2): qty 1

## 2019-10-04 MED ORDER — DULOXETINE HCL 30 MG PO CPEP
120.0000 mg | ORAL_CAPSULE | Freq: Every day | ORAL | Status: DC
  Administered 2019-10-05: 120 mg via ORAL
  Filled 2019-10-04: qty 4

## 2019-10-04 MED ORDER — TIZANIDINE HCL 4 MG PO TABS
2.0000 mg | ORAL_TABLET | Freq: Every day | ORAL | Status: DC
  Administered 2019-10-04 – 2019-10-05 (×2): 2 mg via ORAL
  Filled 2019-10-04 (×2): qty 1

## 2019-10-04 MED ORDER — ADENOSINE 6 MG/2ML IV SOLN: 6.0000 mg | Freq: Once | INTRAVENOUS | Status: AC

## 2019-10-04 MED ORDER — HYDROCODONE-ACETAMINOPHEN 10-325 MG PO TABS: 1.0000 | ORAL_TABLET | Freq: Once | ORAL | Status: DC

## 2019-10-04 MED ORDER — WARFARIN - PHARMACIST DOSING INPATIENT: Freq: Every day | Status: DC

## 2019-10-04 MED ORDER — LATANOPROST 0.005 % OP SOLN
1.0000 [drp] | Freq: Every day | OPHTHALMIC | Status: DC
  Administered 2019-10-04: 1 [drp] via OPHTHALMIC
  Filled 2019-10-04 (×2): qty 2.5

## 2019-10-04 MED ORDER — ONDANSETRON HCL 4 MG/2ML IJ SOLN: 4.0000 mg | Freq: Four times a day (QID) | INTRAMUSCULAR | Status: DC | PRN

## 2019-10-04 MED ORDER — ZOLPIDEM TARTRATE 5 MG PO TABS
5.0000 mg | ORAL_TABLET | Freq: Every day | ORAL | Status: DC
  Administered 2019-10-04: 5 mg via ORAL
  Filled 2019-10-04: qty 1

## 2019-10-04 MED ORDER — LUBIPROSTONE 24 MCG PO CAPS
24.0000 ug | ORAL_CAPSULE | Freq: Two times a day (BID) | ORAL | Status: DC
  Administered 2019-10-05: 24 ug via ORAL
  Filled 2019-10-04 (×4): qty 1

## 2019-10-04 MED ORDER — SIMVASTATIN 10 MG PO TABS
20.0000 mg | ORAL_TABLET | Freq: Every day | ORAL | Status: DC
  Administered 2019-10-04: 20 mg via ORAL
  Filled 2019-10-04: qty 2

## 2019-10-04 MED ORDER — RISPERIDONE 1 MG PO TABS
1.0000 mg | ORAL_TABLET | Freq: Two times a day (BID) | ORAL | Status: DC
  Administered 2019-10-04 – 2019-10-05 (×2): 1 mg via ORAL
  Filled 2019-10-04 (×2): qty 1

## 2019-10-04 MED ORDER — DILTIAZEM LOAD VIA INFUSION
20.0000 mg | Freq: Once | INTRAVENOUS | Status: AC
  Administered 2019-10-04: 20 mg via INTRAVENOUS
  Filled 2019-10-04: qty 20

## 2019-10-04 MED ORDER — ACETAMINOPHEN 325 MG PO TABS
650.0000 mg | ORAL_TABLET | ORAL | Status: DC | PRN
  Administered 2019-10-04: 650 mg via ORAL
  Filled 2019-10-04: qty 2

## 2019-10-04 MED ORDER — INSULIN DETEMIR 100 UNIT/ML ~~LOC~~ SOLN
28.0000 [IU] | Freq: Every day | SUBCUTANEOUS | Status: DC
  Administered 2019-10-04: 28 [IU] via SUBCUTANEOUS
  Filled 2019-10-04 (×2): qty 0.28

## 2019-10-04 MED ORDER — SODIUM CHLORIDE 0.9 % IV BOLUS
500.0000 mL | Freq: Once | INTRAVENOUS | Status: AC
  Administered 2019-10-04: 500 mL via INTRAVENOUS

## 2019-10-04 MED ORDER — PANTOPRAZOLE SODIUM 40 MG PO TBEC
40.0000 mg | DELAYED_RELEASE_TABLET | Freq: Two times a day (BID) | ORAL | Status: DC
  Administered 2019-10-05: 40 mg via ORAL
  Filled 2019-10-04: qty 1

## 2019-10-04 MED ORDER — ADENOSINE 6 MG/2ML IV SOLN
INTRAVENOUS | Status: AC
  Administered 2019-10-04: 6 mg via INTRAVENOUS
  Filled 2019-10-04: qty 2

## 2019-10-04 MED ORDER — DILTIAZEM HCL-DEXTROSE 125-5 MG/125ML-% IV SOLN (PREMIX)
5.0000 mg/h | INTRAVENOUS | Status: DC
  Administered 2019-10-04: 5 mg/h via INTRAVENOUS

## 2019-10-04 MED ORDER — HYDROCODONE-ACETAMINOPHEN 5-325 MG PO TABS
1.0000 | ORAL_TABLET | Freq: Once | ORAL | Status: AC
  Administered 2019-10-04: 1 via ORAL
  Filled 2019-10-04: qty 1

## 2019-10-04 MED ORDER — ONDANSETRON HCL 4 MG PO TABS: 4.0000 mg | ORAL_TABLET | Freq: Two times a day (BID) | ORAL | Status: DC | PRN

## 2019-10-04 MED ORDER — ARIPIPRAZOLE 2 MG PO TABS
2.0000 mg | ORAL_TABLET | Freq: Every day | ORAL | Status: DC
  Administered 2019-10-05: 2 mg via ORAL
  Filled 2019-10-04 (×3): qty 1

## 2019-10-04 NOTE — ED Provider Notes (Addendum)
Pt signed out by Dr. Ashok Cordia.  I went to evaluate her and found her to be in atrial fib.  Rate in the low 100s.  She does not have a hx of afib, but is anticoagulated on coumadin due to stroke and DVT although INR today is only 1.3.  Pt is due to take her coumadin tonight, so I will hold on additional blood thinners.  Pt was given 5 mg of lopressor IV.  Hr is now in the 54s.  Pt is on Metoprolol and I will keep her on her current dose until she sees cardiology.  Pt knows to return if worse.   Isla Pence, MD 10/04/19 1717  Prior to patient leaving, her HR went up to the 150s.  Afib with RVR.  Due to this, pt placed on a cardizem drip.  She was d/w Dr. Nehemiah Settle (triad) for admission.  covid neg.  CRITICAL CARE Performed by: Isla Pence   Total critical care time: 30 minutes  Critical care time was exclusive of separately billable procedures and treating other patients.  Critical care was necessary to treat or prevent imminent or life-threatening deterioration.  Critical care was time spent personally by me on the following activities: development of treatment plan with patient and/or surrogate as well as nursing, discussions with consultants, evaluation of patient's response to treatment, examination of patient, obtaining history from patient or surrogate, ordering and performing treatments and interventions, ordering and review of laboratory studies, ordering and review of radiographic studies, pulse oximetry and re-evaluation of patient's condition.    Isla Pence, MD 10/04/19 Bosie Helper

## 2019-10-04 NOTE — ED Notes (Signed)
Per Dr. Gilford Raid still administer 5mg /42mL of metoprolol with a B/P of 96/67.

## 2019-10-04 NOTE — Progress Notes (Signed)
[place] for Warfarin Indication: atrial fibrillation  Allergies  Allergen Reactions  . Sulfa Antibiotics Rash    Patient Measurements: Height: 5\' 5"  (165.1 cm) Weight: 53.1 kg (117 lb) IBW/kg (Calculated) : 57 Heparin Dosing Weight:   Vital Signs: Temp: 97.6 F (36.4 C) (09/11 1202) Temp Source: Oral (09/11 1202) BP: 129/64 (09/11 1800) Pulse Rate: 136 (09/11 1800)  Labs: Recent Labs    10/04/19 1232 10/04/19 1426  HGB 12.4  --   HCT 37.6  --   PLT 241  --   LABPROT 16.0*  --   INR 1.3*  --   CREATININE 0.61  --   TROPONINIHS 10 10    Estimated Creatinine Clearance: 51.7 mL/min (by C-G formula based on SCr of 0.61 mg/dL).   Medical History: Past Medical History:  Diagnosis Date  . Anxiety   . Arthritis   . COVID-19 virus infection    COVID-19+ approx 01/24/19; asymptomatic course with full recovery  . Dependence on wheelchair    pivot/transfers  . Depression    History of psychosis and previous suicide attempt  . DVT, lower extremity, recurrent (HCC)    Long-term Coumadin per Dr. Legrand Rams  . Essential hypertension   . GERD (gastroesophageal reflux disease)   . Hemiplegia ([place]) 2010   Left side  . History of stroke    Acute infarct and right cerebral white matter small vessel disease 12/10  . Leg DVT (deep venous thromboembolism), acute ([place]) 2006  . Schizophrenia ([place])   . Stroke [facility])    left sided weakness  . Type 2 diabetes mellitus (HCC)     Medications:  Scheduled:  . [START ON 10/05/2019] ARIPiprazole  2 mg Oral Daily  . clonazePAM  0.5 mg Oral TID  . [START ON 10/05/2019] DULoxetine  120 mg Oral Daily  . enoxaparin (LOVENOX) injection  1.5 mg/kg Subcutaneous Q24H  . gabapentin  300 mg Oral TID  . [START ON 10/05/2019] insulin aspart  0-15 Units Subcutaneous TID WC  . insulin aspart  0-5 Units Subcutaneous QHS  . insulin detemir  28 Units Subcutaneous QHS  . latanoprost  1 drop Both Eyes QHS  . [START  ON 10/05/2019] loratadine  10 mg Oral Daily  . [START ON 10/05/2019] lubiprostone  24 mcg Oral BID WC  . [START ON 10/05/2019] midodrine  5 mg Oral BID WC  . [START ON 10/05/2019] mirabegron ER  25 mg Oral Daily  . mirtazapine  30 mg Oral QHS  . [START ON 10/05/2019] pantoprazole  40 mg Oral BID AC  . risperiDONE  1 mg Oral BID  . simvastatin  20 mg Oral QHS  . tiZANidine  2 mg Oral Daily  . warfarin  7.5 mg Oral Once  . [START ON 10/05/2019] Warfarin - Pharmacist Dosing Inpatient   Does not apply q1600  . zolpidem  5 mg Oral QHS    Assessment: Patient is a 61 yof thar is being admitted today for Afib with RVR. The patient has a hx of DVT and has been on long term AC with warfarin. Patient states she took her last dose of Warfarin last evening (9/10).  Home Regimen: 2.5mg  M &Th and 5mg  ROW  -of note patient has been having higher INR readings last few AC visits and they have been lowering dose.   Goal of Therapy:  INR 2-3 Monitor platelets by anticoagulation protocol: Yes   Plan:  - INR currently subtherapeutic at 1.3  -  Admitting team is bringing with Lovenox 80mg  SubQ q24h - Will give Warfarin 7.5mg  PO x 1 dose tonight - Monitor daily PT-INR and cbc   Duanne Limerick PharmD. BCPS  10/04/2019,9:19 PM

## 2019-10-04 NOTE — ED Notes (Addendum)
Pts. Blood pressure dropped to 77/56 after administering 2.5 ml of lopressor and heart rate was 77. Stopped pushing and consulted Dr. Gilford Raid. Spoke with Dr. Gilford Raid advised to not give the other 2.5 of ml of lopressor. Recycling pts. Blood pressure every 15 minutes.

## 2019-10-04 NOTE — ED Provider Notes (Signed)
[facility] EMERGENCY DEPARTMENT Provider Note   CSN: [id number] Arrival date & time: 10/04/19  1128     History Chief Complaint  Patient presents with  . Shortness of Breath    [name] is a 74 y.o. female.  Patient arrives via EMS from [facility] with shortness of breath. Patient was noted to be tachycardic and hypertensive at [facility]. Symptoms acute onset this AM, moderate, constant, improved on o2 [place]. EMS notes BP in 90s for them. No fever, chills, or sweats. +occasional non prod cough. No sore throat or runny nose. Denies chest pain or discomfort. No abd pain or vomiting/diarrhea. No dysuria or gu c/o. No leg pain or swelling. Denies palpitations. No pleuritic pain. Pt denies known covid exposure and did have vaccine.   The history is provided by the patient.  Shortness of Breath Associated symptoms: cough   Associated symptoms: no abdominal pain, no chest pain, no fever, no headaches, no neck pain, no rash, no sore throat and no vomiting        Past Medical History:  Diagnosis Date  . Anxiety   . Arthritis   . COVID-19 virus infection    COVID-19+ approx 01/24/19; asymptomatic course with full recovery  . Dependence on wheelchair    pivot/transfers  . Depression    History of psychosis and previous suicide attempt  . DVT, lower extremity, recurrent (HCC)    Long-term Coumadin per Dr. Legrand Rams  . Essential hypertension   . GERD (gastroesophageal reflux disease)   . Hemiplegia ([place]) 2010   Left side  . History of stroke    Acute infarct and right cerebral white matter small vessel disease 12/10  . Leg DVT (deep venous thromboembolism), acute ([place]) 2006  . Schizophrenia ([place])   . Stroke [facility])    left sided weakness  . Type 2 diabetes mellitus [facility])     Patient Active Problem List   Diagnosis Date Noted  . GERD (gastroesophageal reflux disease) 08/26/2019  . Loss of weight 08/26/2019  . Constipation 09/09/2018  . Hypotension 03/21/2018  . Diarrhea 07/20/2017  .  Primary osteoarthritis of right hip 04/18/2017  . Orthostasis 03/28/2017  . General weakness 03/28/2017  . History of cerebrovascular accident (CVA) with residual deficit 03/28/2017  . Schizophrenia ([place]) 03/28/2017  . Generalized weakness 03/28/2017  . Orthostatic hypotension   . IBS (irritable bowel syndrome) 10/16/2016  . Left hemiparesis ([place]) 10/06/2016  . Left-sided weakness 10/06/2016  . Chronic superficial gastritis without bleeding   . Stricture and stenosis of esophagus   . Nausea with vomiting 04/20/2016  . Abdominal pain, acute, bilateral lower quadrant 03/22/2016  . Lesion of right lobe of liver 03/22/2016  . Elevated troponin 02/13/2016  . IDA (iron deficiency anemia) 04/06/2015  . Mixed hyperlipidemia 11/06/2014  . Obesity due to excess calories 11/06/2014  . Sedentary lifestyle 11/06/2014  . Rectal bleeding 10/21/2014  . Encounter for therapeutic drug monitoring 03/24/2013  . Chronic anticoagulation 04/09/2010  . Type 2 diabetes mellitus with vascular disease ([place]) 03/24/2010  . Essential hypertension, benign 03/24/2010  . DVT, lower extremity, recurrent ([place]) 03/24/2010  . History of cardiovascular disorder 03/24/2010    Past Surgical History:  Procedure Laterality Date  . BACK SURGERY    . BIOPSY N/A 11/24/2014   Procedure: BIOPSY;  Surgeon: Danie Binder, MD;  Location: [facility] ORS;  Service: Endoscopy;  Laterality: N/A;  . BIOPSY  05/17/2016   Procedure: BIOPSY;  Surgeon: Danie Binder, MD;  Location: [facility] ENDO SUITE;  Service: Endoscopy;;  gastric biopsy  . COLONOSCOPY WITH PROPOFOL N/A 11/24/2014   Dr. Rudie Meyer polyps removed/moderate sized internal hemorrhoids, tubular adenomas. Next surveillance in 3 years  . ESOPHAGOGASTRODUODENOSCOPY (EGD) WITH PROPOFOL N/A 11/24/2014   Dr. Clayburn Pert HH/patent stricture at the gastroesophageal junction, mild non-erosive gastritis, path negative for H.pylori or celiac sprue  . ESOPHAGOGASTRODUODENOSCOPY (EGD) WITH PROPOFOL  N/A 05/17/2016   Procedure: ESOPHAGOGASTRODUODENOSCOPY (EGD) WITH PROPOFOL;  Surgeon: Danie Binder, MD;  Location: [facility] ENDO SUITE;  Service: Endoscopy;  Laterality: N/A;  12:45pm  . GIVENS CAPSULE STUDY N/A 12/11/2014   MULTILPLE EROSION IN the stomach WITH ACTIVE OOZING. OCCASIONAL EROSIONS AND RARE ULCER SEEN IN PROXIMAL SMALL BOWEL . No masses or AVMs SEEN. NO OLD BLOOD OR FRESH BLOOD SEEN.   . POLYPECTOMY N/A 11/24/2014   Procedure: POLYPECTOMY;  Surgeon: Danie Binder, MD;  Location: [facility] ORS;  Service: Endoscopy;  Laterality: N/A;  . SAVORY DILATION N/A 05/17/2016   Procedure: SAVORY DILATION;  Surgeon: Danie Binder, MD;  Location: [facility] ENDO SUITE;  Service: Endoscopy;  Laterality: N/A;     OB History   No obstetric history on file.     Family History  Problem Relation Age of Onset  . Hypertension Mother   . Colon cancer Neg Hx     Social History   Tobacco Use  . Smoking status: Former Smoker    Packs/day: 0.25    Years: 20.00    Pack years: 5.00    Types: Cigarettes    Quit date: 01/24/1995    Years since quitting: 24.7  . Smokeless tobacco: Never Used  Vaping Use  . Vaping Use: Never used  Substance Use Topics  . Alcohol use: No    Alcohol/week: 0.0 standard drinks  . Drug use: No    Home Medications Prior to Admission medications   Medication Sig Start Date End Date Taking? Authorizing Provider  ABILIFY 2 MG tablet Take 1 tablet (2 mg total) by mouth daily. 08/19/19   [name], MD  acetaminophen (TYLENOL) 325 MG tablet Take 650 mg by mouth 3 (three) times daily as needed.    [provider]  calcium carbonate (TUMS - DOSED IN MG ELEMENTAL CALCIUM) 500 MG chewable tablet Chew 1 tablet by mouth 4 (four) times daily as needed for indigestion or heartburn.     [provider]  cholecalciferol (VITAMIN D) 1000 units tablet Take 2,000 Units by mouth daily.    [provider]  clonazePAM (KLONOPIN) 0.5 MG tablet Take 1 tablet (0.5 mg total)  by mouth 3 (three) times daily. 01/11/18   [name], MD  docusate sodium (COLACE) 100 MG capsule Take 200 mg by mouth at bedtime.     [provider]  DULoxetine (CYMBALTA) 60 MG capsule Take 120 mg by mouth daily.     [provider]  gabapentin (NEURONTIN) 300 MG capsule Take 300 mg by mouth 3 (three) times daily.    [provider]  HYDROcodone-acetaminophen (NORCO) 10-325 MG tablet Take 1 tablet by mouth every 8 (eight) hours as needed.  02/27/18   [provider]  hydrocortisone (ANUSOL-HC) 2.5 % rectal cream Place 1 application rectally 2 (two) times daily as needed.     [provider]  iron polysaccharides (NIFEREX) 150 MG capsule Take 1 capsule (150 mg total) by mouth daily. 04/10/16   Baird Cancer, PA-C  latanoprost (XALATAN) 0.005 % ophthalmic solution Place 1 drop into both eyes at bedtime.  [provider]  LEVEMIR FLEXTOUCH 100 UNIT/ML Pen INJECT 28 UNITS INTO THE SKIN AT BEDTIME. 01/27/19   Nida, Marella Chimes, MD  loratadine (CLARITIN) 10 MG tablet Take 10 mg by mouth daily.    [provider]  lubiprostone (AMITIZA) 24 MCG capsule Take 1 capsule (24 mcg total) by mouth 2 (two) times daily with a meal. 07/02/19   Erenest Rasher, PA-C  metoprolol succinate (TOPROL-XL) 25 MG 24 hr tablet Take 0.5 tablets (12.5 mg total) by mouth daily. Patient taking differently: Take 12.5 mg by mouth 2 (two) times daily.  03/31/17   Johnson, Clanford L, MD  midodrine (PROAMATINE) 2.5 MG tablet Take 5 mg by mouth 3 (three) times daily with meals.  04/09/17   [provider]  mirabegron ER (MYRBETRIQ) 25 MG TB24 tablet Take 25 mg by mouth daily.    [provider]  mirtazapine (REMERON) 30 MG tablet Take 30 mg by mouth at bedtime.    [provider]  ondansetron (ZOFRAN) 4 MG tablet Take 1 tablet (4 mg total) by mouth 4 (four) times daily -  before meals and at bedtime. 04/20/16   Annitta Needs, NP    pantoprazole (PROTONIX) 40 MG tablet Take 1 tablet (40 mg total) by mouth 2 (two) times daily before a meal. 08/26/19   Carlis Stable, NP  Prucalopride Succinate (MOTEGRITY) 2 MG TABS Take 1 tablet (2 mg total) by mouth daily. 03/06/19   Carlis Stable, NP  risperiDONE (RISPERDAL) 0.5 MG tablet 0.5 mg daily, and 1 mg at night (along with 1 mg tab) 08/14/19   [name], MD  risperiDONE (RISPERDAL) 1 MG tablet Take 1 tablet (1 mg total) by mouth in the morning and at bedtime. 08/19/19   [name], MD  simvastatin (ZOCOR) 20 MG tablet Take 20 mg by mouth at bedtime.  03/31/16   [provider]  tiZANidine (ZANAFLEX) 2 MG tablet Take 2 mg by mouth daily.    [provider]  warfarin (COUMADIN) 5 MG tablet Take 1 tablet tonight then increase dose to 1 tablet daily except 1/2 tablet on Mondays, Wednesdays and Fridays 12/11/18   Satira Sark, MD  zolpidem (AMBIEN) 10 MG tablet Take 1 tablet (10 mg total) by mouth at bedtime. Please hold this medication if she has drowsiness 06/04/19   [name], MD    Allergies    Sulfa antibiotics and Sulfonamide derivatives  Review of Systems   Review of Systems  Constitutional: Negative for chills and fever.  HENT: Negative for sore throat.   Eyes: Negative for redness.  Respiratory: Positive for cough and shortness of breath.   Cardiovascular: Negative for chest pain, palpitations and leg swelling.  Gastrointestinal: Negative for abdominal pain, diarrhea and vomiting.  Endocrine: Negative for polyuria.  Genitourinary: Negative for dysuria and flank pain.  Musculoskeletal: Negative for back pain and neck pain.  Skin: Negative for rash.  Neurological: Negative for headaches.  Hematological: Does not bruise/bleed easily.  Psychiatric/Behavioral: Negative for confusion.    Physical Exam Updated Vital Signs BP (!) 120/59 (BP Location: Right Arm)   Pulse 70   Temp 97.6 F (36.4 C) (Oral)   Resp (!) 24   Ht 1.651 m (5\' 5" )   Wt  53.1 kg   SpO2 98%   BMI 19.47 kg/m   Physical Exam Vitals and nursing note reviewed.  Constitutional:      Appearance: Normal appearance. She is well-developed.  HENT:  Head: Atraumatic.     Nose: Nose normal.     Mouth/Throat:     Mouth: Mucous membranes are moist.  Eyes:     General: No scleral icterus.    Conjunctiva/sclera: Conjunctivae normal.     Pupils: Pupils are equal, round, and reactive to light.  Neck:     Trachea: No tracheal deviation.  Cardiovascular:     Rate and Rhythm: Normal rate. Rhythm irregular.     Pulses: Normal pulses.     Heart sounds: Normal heart sounds. No murmur heard.  No friction rub. No gallop.   Pulmonary:     Effort: Pulmonary effort is normal. No respiratory distress.     Breath sounds: Normal breath sounds.  Abdominal:     General: Bowel sounds are normal. There is no distension.     Palpations: Abdomen is soft.     Tenderness: There is no abdominal tenderness. There is no guarding.  Genitourinary:    Comments: No cva tenderness.  Musculoskeletal:        General: No swelling or tenderness.     Cervical back: Normal range of motion and neck supple. No rigidity. No muscular tenderness.     Right lower leg: No edema.     Left lower leg: No edema.  Skin:    General: Skin is warm and dry.     Findings: No rash.  Neurological:     Mental Status: She is alert.     Comments: Alert, speech normal.   Psychiatric:        Mood and Affect: Mood normal.     ED Results / Procedures / Treatments   Labs (all labs ordered are listed, but only abnormal results are displayed) Results for orders placed or performed during the hospital encounter of 10/04/19  CBC  Result Value Ref Range   WBC 6.5 4.0 - 10.5 K/uL   RBC 4.18 3.87 - 5.11 MIL/uL   Hemoglobin 12.4 12.0 - 15.0 g/dL   HCT 37.6 36 - 46 %   MCV 90.0 80.0 - 100.0 fL   MCH 29.7 26.0 - 34.0 pg   MCHC 33.0 30.0 - 36.0 g/dL   RDW 12.3 11.5 - 15.5 %   Platelets 241 150 - 400 K/uL    nRBC 0.0 0.0 - 0.2 %  Comprehensive metabolic panel  Result Value Ref Range   Sodium 135 135 - 145 mmol/L   Potassium 4.0 3.5 - 5.1 mmol/L   Chloride 99 98 - 111 mmol/L   CO2 28 22 - 32 mmol/L   Glucose, Bld 314 (H) 70 - 99 mg/dL   BUN 15 8 - 23 mg/dL   Creatinine, Ser 0.61 0.44 - 1.00 mg/dL   Calcium 8.5 (L) 8.9 - 10.3 mg/dL   Total Protein 6.1 (L) 6.5 - 8.1 g/dL   Albumin 3.6 3.5 - 5.0 g/dL   AST 11 (L) 15 - 41 U/L   ALT 10 0 - 44 U/L   Alkaline Phosphatase 61 38 - 126 U/L   Total Bilirubin 0.5 0.3 - 1.2 mg/dL   GFR calc non Af Amer >60 >60 mL/min   GFR calc Af Amer >60 >60 mL/min   Anion gap 8 5 - 15  D-dimer, quantitative (not at [facility])  Result Value Ref Range   D-Dimer, Quant <0.27 0.00 - 0.50 ug/mL-FEU  Protime-INR  Result Value Ref Range   Prothrombin Time 16.0 (H) 11.4 - 15.2 seconds   INR 1.3 (H) 0.8 - 1.2  Brain  natriuretic peptide  Result Value Ref Range   B Natriuretic Peptide 311.0 (H) 0.0 - 100.0 pg/mL  Troponin I (High Sensitivity)  Result Value Ref Range   Troponin I (High Sensitivity) 10 <18 ng/L    EKG EKG Interpretation  Date/Time:  Saturday October 04 2019 12:00:35 EDT Ventricular Rate:  94 PR Interval:    QRS Duration: 93 QT Interval:  404 QTC Calculation: 380 R Axis:   54 Text Interpretation: Sinus rhythm Supraventricular bigeminy Nonspecific ST abnormality `st appearance similar to ecg 06/27/2017 Confirmed by Lajean Saver [phone]) on 10/04/2019 12:09:45 PM   Radiology DG Chest Port 1 View  Result Date: 10/04/2019 CLINICAL DATA:  Shortness of breath EXAM: PORTABLE CHEST 1 VIEW COMPARISON:  March 28 2017. FINDINGS: There is mild scarring in the upper lobe regions. There is no edema or airspace opacity. Heart is upper normal in size with pulmonary vascularity normal. No adenopathy. Degenerative change noted in each shoulder. IMPRESSION: Mild upper lobe scarring bilaterally. No edema or airspace opacity. Heart upper normal in size. No adenopathy  evident. Electronically Signed   By: Lowella Grip III M.D.   On: 10/04/2019 12:54    Procedures Procedures (including critical care time)  Medications Ordered in ED Medications - No data to display  ED Course  I have reviewed the triage vital signs and the nursing notes.  Pertinent labs & imaging results that were available during my care of the patient were reviewed by me and considered in my medical decision making (see chart for details).    MDM Rules/Calculators/A&P                          Iv ns. Continuous pulse ox and monitor. Stat labs and imaging. Ecg.   Reviewed nursing notes and prior charts for additional history.   Covid swab sent.  [name] was evaluated in Emergency Department on 10/04/2019 for the symptoms described in the history of present illness. She was evaluated in the context of the global COVID-19 pandemic, which necessitated consideration that the patient might be at risk for infection with the SARS-CoV-2 virus that causes COVID-19. Institutional protocols and algorithms that pertain to the evaluation of patients at risk for COVID-19 are in a state of rapid change based on information released by regulatory bodies including the CDC and federal and state organizations. These policies and algorithms were followed during the patient's care in the ED.  Initial labs reviewed/interpreted by me - wbc normal. ddimer normal.  While in ED, patient went into a narrow complex tachycardia, reproducing her earlier symptoms. No change with vagal maneuvers. Adenosine 6 mg rapid ivp, with conversion to NSR, and resolution of symptoms. Repeat ecg w nsr, no new/acute ischemia.   Recheck pt, no cp or sob. No palpitations. Remains in nsr.   CXR reviewed/interpreted by me - no pna.   Pt requests to be given dose of her normal meds, hydrocodone, klonopin - po dose provided.   Recheck, hr remains in 48s, rr is 16. Pulse ox is 99%. Po fluids, food. Await delta troponin.    1500 delta trop pending. Signed out to Dr Gilford Raid to check delta trop, recheck pt. If delta trop ok, and remains in sinus rhythm and symptom free, feel likely patient will be able to be d/c with outpatient cardiology f/u.      Final Clinical Impression(s) / ED Diagnoses Final diagnoses:  None    Rx / DC Orders ED Discharge  Orders    None       Lajean Saver, MD 10/04/19 [phone]

## 2019-10-04 NOTE — ED Triage Notes (Signed)
Pt. Came via EMS from [facility] with complaints of [place]. Upon arrival EMS states that pt. Was tachy and hypertensive per facility. EMS states that Pts. B/P was 91/56 with a pulse of 84 during assessment. Pt. Was sating in the 90s on RA. Pt. Placed on 2 L to help with anxiety per EMS. Pt. States they still feel short of breath.

## 2019-10-04 NOTE — ED Notes (Signed)
6mg  of adenosine pushed. Pt. In sinus rhythm. Pt. Alert and oriented x4.

## 2019-10-04 NOTE — H&P (Signed)
History and Physical  [name] IOX:[id number] DOB: 1945-12-18 DOA: 10/04/2019  Referring physician: Dr. Gilford Raid, ED physician PCP: [name], MD  Outpatient Specialists:   Patient Coming From: [facility]  Chief Complaint: Chest pain  HPI: [name] is a 74 y.o. female with a history of CVA with residual left-sided weakness, type 2 diabetes on insulin, history of DVT on chronic anticoagulation (Coumadin), hypertension, orthostatic hypotension, GERD, schizophrenia.  Patient seen for chest discomfort that started earlier today.  Patient is not a good historian.  Unable to give palliating or provoking factors.    Emergency Department Course: On presentation to the ER, found to be tachycardic in the 170s.  She was given a dose of adenosine which slowed her heart rate.  Became evident that she was in atrial fibrillation.  She then began to be tachycardic again in the 130s to 40s.  She was placed on a Cardizem drip  Review of Systems:   Pt denies any fevers, chills, nausea, vomiting, diarrhea, constipation, abdominal pain, shortness of breath, dyspnea on exertion, orthopnea, cough, wheezing, palpitations, headache, vision changes, lightheadedness, dizziness, melena, rectal bleeding.  Review of systems are otherwise negative  Past Medical History:  Diagnosis Date  . Anxiety   . Arthritis   . COVID-19 virus infection    COVID-19+ approx 01/24/19; asymptomatic course with full recovery  . Dependence on wheelchair    pivot/transfers  . Depression    History of psychosis and previous suicide attempt  . DVT, lower extremity, recurrent (HCC)    Long-term Coumadin per Dr. Legrand Rams  . Essential hypertension   . GERD (gastroesophageal reflux disease)   . Hemiplegia ([place]) 2010   Left side  . History of stroke    Acute infarct and right cerebral white matter small vessel disease 12/10  . Leg DVT (deep venous thromboembolism), acute ([place]) 2006  . Schizophrenia  ([place])   . Stroke [facility])    left sided weakness  . Type 2 diabetes mellitus ([place])    Past Surgical History:  Procedure Laterality Date  . BACK SURGERY    . BIOPSY N/A 11/24/2014   Procedure: BIOPSY;  Surgeon: Danie Binder, MD;  Location: [facility];  Service: Endoscopy;  Laterality: N/A;  . BIOPSY  05/17/2016   Procedure: BIOPSY;  Surgeon: Danie Binder, MD;  Location: [facility];  Service: Endoscopy;;  gastric biopsy  . COLONOSCOPY WITH PROPOFOL N/A 11/24/2014   Dr. Rudie Meyer polyps removed/moderate sized internal hemorrhoids, tubular adenomas. Next surveillance in 3 years  . ESOPHAGOGASTRODUODENOSCOPY (EGD) WITH PROPOFOL N/A 11/24/2014   Dr. Clayburn Pert HH/patent stricture at the gastroesophageal junction, mild non-erosive gastritis, path negative for H.pylori or celiac sprue  . ESOPHAGOGASTRODUODENOSCOPY (EGD) WITH PROPOFOL N/A 05/17/2016   Procedure: ESOPHAGOGASTRODUODENOSCOPY (EGD) WITH PROPOFOL;  Surgeon: Danie Binder, MD;  Location: [facility];  Service: Endoscopy;  Laterality: N/A;  12:45pm  . GIVENS CAPSULE STUDY N/A 12/11/2014   MULTILPLE EROSION IN the stomach WITH ACTIVE OOZING. OCCASIONAL EROSIONS AND RARE ULCER SEEN IN PROXIMAL SMALL BOWEL . No masses or AVMs SEEN. NO OLD BLOOD OR FRESH BLOOD SEEN.   . POLYPECTOMY N/A 11/24/2014   Procedure: POLYPECTOMY;  Surgeon: Danie Binder, MD;  Location: [facility];  Service: Endoscopy;  Laterality: N/A;  . SAVORY DILATION N/A 05/17/2016   Procedure: SAVORY DILATION;  Surgeon: Danie Binder, MD;  Location: [facility];  Service: Endoscopy;  Laterality: N/A;   Social History:  reports that she  quit smoking about 24 years ago. Her smoking use included cigarettes. She has a 5.00 pack-year smoking history. She has never used smokeless tobacco. She reports that she does not drink alcohol and does not use drugs. Patient lives at [facility]  Allergies  Allergen Reactions  . Sulfa Antibiotics Rash    Family History  Problem  Relation Age of Onset  . Hypertension Mother   . Colon cancer Neg Hx       Prior to Admission medications   Medication Sig Start Date End Date Taking? Authorizing Provider  ABILIFY 2 MG tablet Take 1 tablet (2 mg total) by mouth daily. 08/19/19  Yes [name], MD  acetaminophen (TYLENOL) 325 MG tablet Take 650 mg by mouth 3 (three) times daily as needed for mild pain or moderate pain.    Yes [provider]  calcium carbonate (TUMS - DOSED IN MG ELEMENTAL CALCIUM) 500 MG chewable tablet Chew 1 tablet by mouth 4 (four) times daily as needed for indigestion or heartburn.    Yes [provider]  cholecalciferol (VITAMIN D) 1000 units tablet Take 2,000 Units by mouth daily.   Yes [provider]  clonazePAM (KLONOPIN) 0.5 MG tablet Take 1 tablet (0.5 mg total) by mouth 3 (three) times daily. 01/11/18  Yes Hisada, Elie Goody, MD  docusate sodium (COLACE) 100 MG capsule Take 200 mg by mouth at bedtime.    Yes [provider]  DULoxetine (CYMBALTA) 60 MG capsule Take 120 mg by mouth daily.    Yes [provider]  fluticasone (FLONASE) 50 MCG/ACT nasal spray Place 2 sprays into both nostrils daily as needed for allergies or rhinitis.   Yes [provider]  gabapentin (NEURONTIN) 300 MG capsule Take 300 mg by mouth 3 (three) times daily.   Yes [provider]  HYDROcodone-acetaminophen (NORCO) 10-325 MG tablet Take 1 tablet by mouth in the morning, at noon, and at bedtime.  02/27/18  Yes [provider]  hydrocortisone (ANUSOL-HC) 2.5 % rectal cream Place 1 application rectally 2 (two) times daily as needed.    Yes [provider]  iron polysaccharides (NIFEREX) 150 MG capsule Take 1 capsule (150 mg total) by mouth daily. 04/10/16  Yes Kefalas, Manon Hilding, PA-C  latanoprost (XALATAN) 0.005 % ophthalmic solution Place 1 drop into both eyes at bedtime.   Yes [provider]  LEVEMIR FLEXTOUCH 100 UNIT/ML Pen INJECT 28 UNITS  INTO THE SKIN AT BEDTIME. 01/27/19  Yes Nida, Marella Chimes, MD  loratadine (CLARITIN) 10 MG tablet Take 10 mg by mouth daily.   Yes [provider]  lubiprostone (AMITIZA) 24 MCG capsule Take 1 capsule (24 mcg total) by mouth 2 (two) times daily with a meal. 07/02/19  Yes Aliene Altes S, PA-C  metoprolol succinate (TOPROL-XL) 25 MG 24 hr tablet Take 0.5 tablets (12.5 mg total) by mouth daily. Patient taking differently: Take 12.5 mg by mouth 2 (two) times daily.  03/31/17  Yes Johnson, Clanford L, MD  midodrine (PROAMATINE) 2.5 MG tablet Take 5 mg by mouth 2 (two) times daily with a meal.  04/09/17  Yes [provider]  mirabegron ER (MYRBETRIQ) 25 MG TB24 tablet Take 25 mg by mouth daily.   Yes [provider]  mirtazapine (REMERON) 30 MG tablet Take 30 mg by mouth at bedtime.   Yes [provider]  ondansetron (ZOFRAN) 4 MG tablet Take 1 tablet (4 mg total) by mouth 4 (four) times daily -  before meals  and at bedtime. Patient taking differently: Take 4 mg by mouth 2 (two) times daily as needed for nausea or vomiting.  04/20/16  Yes Annitta Needs, NP  pantoprazole (PROTONIX) 40 MG tablet Take 1 tablet (40 mg total) by mouth 2 (two) times daily before a meal. 08/26/19  Yes Walden Field A, NP  phenol (CHLORASEPTIC) 1.4 % LIQD Use as directed 1 spray in the mouth or throat every 6 (six) hours as needed for throat irritation / pain.   Yes [provider]  polyethylene glycol (MIRALAX / GLYCOLAX) 17 g packet Take 17 g by mouth 2 (two) times daily as needed for mild constipation or moderate constipation.   Yes [provider]  risperiDONE (RISPERDAL) 1 MG tablet Take 1 tablet (1 mg total) by mouth in the morning and at bedtime. 08/19/19  Yes Hisada, Elie Goody, MD  simvastatin (ZOCOR) 20 MG tablet Take 20 mg by mouth at bedtime.  03/31/16  Yes [provider]  tiZANidine (ZANAFLEX) 2 MG tablet Take 2 mg by mouth daily.   Yes [provider]    warfarin (COUMADIN) 5 MG tablet Take 1 tablet tonight then increase dose to 1 tablet daily except 1/2 tablet on Mondays, Wednesdays and Fridays Patient taking differently: Take 2.5-5 mg by mouth See admin instructions. Take 2.5mg  on Mondays and Thursdays. Take 5mg  on all other days 12/11/18  Yes Satira Sark, MD  zolpidem (AMBIEN) 10 MG tablet Take 1 tablet (10 mg total) by mouth at bedtime. Please hold this medication if she has drowsiness 06/04/19  Yes [name], MD    Physical Exam: BP 129/64   Pulse (!) 136   Temp 97.6 F (36.4 C) (Oral)   Resp (!) 31   Ht 5\' 5"  (1.651 m)   Wt 53.1 kg   SpO2 92%   BMI 19.47 kg/m   . General: Elderly female. Awake and alert and oriented x3. No acute cardiopulmonary distress.  Marland Kitchen HEENT: Normocephalic atraumatic.  Right and left ears normal in appearance.  Pupils equal, round, reactive to light. Extraocular muscles are intact. Sclerae anicteric and noninjected.  Moist mucosal membranes. No mucosal lesions.  . Neck: Neck supple without lymphadenopathy. No carotid bruits. No masses palpated.  . Cardiovascular: Irregularly irregular rate. No murmurs, rubs, gallops auscultated. No JVD.  Marland Kitchen Respiratory: Good respiratory effort with no wheezes, rales, rhonchi. Lungs clear to auscultation bilaterally.  No accessory muscle use. . Abdomen: Soft, nontender, nondistended. Active bowel sounds. No masses or hepatosplenomegaly  . Skin: No rashes, lesions, or ulcerations.  Dry, warm to touch. 2+ dorsalis pedis and radial pulses. . Musculoskeletal: No calf or leg pain. All major joints not erythematous nontender.  No upper or lower joint deformation.  Good ROM.  No contractures  . Psychiatric: Intact judgment and insight. Pleasant and cooperative. . Neurologic: No focal neurological deficits. Strength is 5/5 and symmetric in upper and lower extremities.  Cranial nerves II through XII are grossly intact.           Labs on Admission: I have personally reviewed  following labs and imaging studies  CBC: Recent Labs  Lab 10/04/19 1232  WBC 6.5  HGB 12.4  HCT 37.6  MCV 90.0  PLT 628   Basic Metabolic Panel: Recent Labs  Lab 10/04/19 1232  NA 135  K 4.0  CL 99  CO2 28  GLUCOSE 314*  BUN 15  CREATININE 0.61  CALCIUM 8.5*   GFR: Estimated Creatinine Clearance: 51.7 mL/min (by C-G  formula based on SCr of 0.61 mg/dL). Liver Function Tests: Recent Labs  Lab 10/04/19 1232  AST 11*  ALT 10  ALKPHOS 61  BILITOT 0.5  PROT 6.1*  ALBUMIN 3.6   No results for input(s): LIPASE, AMYLASE in the last 168 hours. No results for input(s): AMMONIA in the last 168 hours. Coagulation Profile: Recent Labs  Lab 10/04/19 1232  INR 1.3*   Cardiac Enzymes: No results for input(s): CKTOTAL, CKMB, CKMBINDEX, TROPONINI in the last 168 hours. BNP (last 3 results) No results for input(s): PROBNP in the last 8760 hours. HbA1C: No results for input(s): HGBA1C in the last 72 hours. CBG: Recent Labs  Lab 10/04/19 1533 10/04/19 1838  GLUCAP 169* 70   Lipid Profile: No results for input(s): CHOL, HDL, LDLCALC, TRIG, CHOLHDL, LDLDIRECT in the last 72 hours. Thyroid Function Tests: No results for input(s): TSH, T4TOTAL, FREET4, T3FREE, THYROIDAB in the last 72 hours. Anemia Panel: No results for input(s): VITAMINB12, FOLATE, FERRITIN, TIBC, IRON, RETICCTPCT in the last 72 hours. Urine analysis:    Component Value Date/Time   COLORURINE YELLOW 06/27/2017 1052   APPEARANCEUR CLEAR 06/27/2017 1052   APPEARANCEUR Cloudy (A) 05/01/2016 1525   LABSPEC 1.013 06/27/2017 1052   PHURINE 7.0 06/27/2017 1052   GLUCOSEU NEGATIVE 06/27/2017 1052   HGBUR NEGATIVE 06/27/2017 1052   BILIRUBINUR NEGATIVE 06/27/2017 1052   BILIRUBINUR Negative 05/01/2016 1525   KETONESUR NEGATIVE 06/27/2017 1052   PROTEINUR NEGATIVE 06/27/2017 1052   UROBILINOGEN 0.2 09/14/2014 1703   NITRITE NEGATIVE 06/27/2017 1052   LEUKOCYTESUR NEGATIVE 06/27/2017 1052   LEUKOCYTESUR  3+ (A) 05/01/2016 1525   Sepsis Labs: @LABRCNTIP (procalcitonin:4,lacticidven:4) ) Recent Results (from the past 240 hour(s))  SARS Coronavirus 2 by RT PCR (hospital order, performed in [place] hospital lab) Nasopharyngeal Nasopharyngeal Swab     Status: None   Collection Time: 10/04/19 12:11 PM   Specimen: Nasopharyngeal Swab  Result Value Ref Range Status   SARS Coronavirus 2 NEGATIVE NEGATIVE Final    Comment: (NOTE) SARS-CoV-2 target nucleic acids are NOT DETECTED.  The SARS-CoV-2 RNA is generally detectable in upper and lower respiratory specimens during the acute phase of infection. The lowest concentration of SARS-CoV-2 viral copies this assay can detect is 250 copies / mL. A negative result does not preclude SARS-CoV-2 infection and should not be used as the sole basis for treatment or other patient management decisions.  A negative result may occur with improper specimen collection / handling, submission of specimen other than nasopharyngeal swab, presence of viral mutation(s) within the areas targeted by this assay, and inadequate number of viral copies (<250 copies / mL). A negative result must be combined with clinical observations, patient history, and epidemiological information.  Fact Sheet for Patients:   StrictlyIdeas.no  Fact Sheet for Healthcare Providers: BankingDealers.co.za  This test is not yet approved or  cleared by the [place] FDA and has been authorized for detection and/or diagnosis of SARS-CoV-2 by FDA under an Emergency Use Authorization (EUA).  This EUA will remain in effect (meaning this test can be used) for the duration of the COVID-19 declaration under Section 564(b)(1) of the Act, 21 U.S.C. section 360bbb-3(b)(1), unless the authorization is terminated or revoked sooner.  Performed at [facility], [address] [place], [place] [address]      Radiological Exams on Admission: DG  Chest Port 1 View  Result Date: 10/04/2019 CLINICAL DATA:  Shortness of breath EXAM: PORTABLE CHEST 1 VIEW COMPARISON:  March 28 2017. FINDINGS: There  is mild scarring in the upper lobe regions. There is no edema or airspace opacity. Heart is upper normal in size with pulmonary vascularity normal. No adenopathy. Degenerative change noted in each shoulder. IMPRESSION: Mild upper lobe scarring bilaterally. No edema or airspace opacity. Heart upper normal in size. No adenopathy evident. Electronically Signed   By: Lowella Grip III M.D.   On: 10/04/2019 12:54    EKG: Independently reviewed.  Atrial fibrillation  Assessment/Plan: Active Problems:   Type 2 diabetes mellitus with vascular disease (HCC)   Essential hypertension, benign   DVT, lower extremity, recurrent (HCC)   Chronic anticoagulation   Obesity due to excess calories   History of cerebrovascular accident (CVA) with residual deficit   Schizophrenia (HCC)   Orthostatic hypotension   GERD (gastroesophageal reflux disease)   Atrial fibrillation with RVR ([place])    This patient was discussed with the ED physician, including pertinent vitals, physical exam findings, labs, and imaging.  We also discussed care given by the ED provider.  1. Atrial fibrillation with RVR a. Admit to stepdown b. Cardizem drip c. TSH d. Echocardiogram in the morning e. Lipid profile 2. Diabetes a. Hemoglobin A1c b. Continue long-acting insulin c. Sliding scale insulin with CBGs 3. Hypertension a. We will hold metoprolol for now 4. Orthostasis a. Continue midodrine 5. History of DVT with chronic anticoagulation a. Not therapeutic on anticoagulation b. We will give Lovenox and consult pharmacy for Coumadin dosing 6. History of CVA 7. Schizophrenia  a. Continue home regimen  DVT prophylaxis: Coumadin/Lovenox therapeutic dose Consultants: Pharmacy Code Status: Full code Family Communication: None Disposition Plan: Patient needs to return back  to [facility] [place] [facility] following admission   Truett Mainland, DO

## 2019-10-04 NOTE — ED Notes (Signed)
Called EMS for transport back to [facility] as requested.

## 2019-10-05 ENCOUNTER — Observation Stay (HOSPITAL_BASED_OUTPATIENT_CLINIC_OR_DEPARTMENT_OTHER): Payer: Medicare Other

## 2019-10-05 ENCOUNTER — Encounter (HOSPITAL_COMMUNITY): Payer: Self-pay | Admitting: Family Medicine

## 2019-10-05 DIAGNOSIS — I4891 Unspecified atrial fibrillation: Secondary | ICD-10-CM

## 2019-10-05 DIAGNOSIS — I340 Nonrheumatic mitral (valve) insufficiency: Secondary | ICD-10-CM

## 2019-10-05 LAB — CBC
HCT: 39.8 % (ref 36.0–46.0)
Hemoglobin: 13.2 g/dL (ref 12.0–15.0)
MCH: 30.2 pg (ref 26.0–34.0)
MCHC: 33.2 g/dL (ref 30.0–36.0)
MCV: 91.1 fL (ref 80.0–100.0)
Platelets: 231 10*3/uL (ref 150–400)
RBC: 4.37 MIL/uL (ref 3.87–5.11)
RDW: 12.4 % (ref 11.5–15.5)
WBC: 6.9 10*3/uL (ref 4.0–10.5)
nRBC: 0 % (ref 0.0–0.2)

## 2019-10-05 LAB — ECHOCARDIOGRAM COMPLETE
AR max vel: 3.92 cm2
AV Area VTI: 3.68 cm2
AV Area mean vel: 3.58 cm2
AV Mean grad: 9 mmHg
AV Peak grad: 14.7 mmHg
Ao pk vel: 1.92 m/s
Area-P 1/2: 3.45 cm2
Height: 65 in
S' Lateral: 1.84 cm
Weight: 1872 oz

## 2019-10-05 LAB — BASIC METABOLIC PANEL
Anion gap: 5 (ref 5–15)
BUN: 13 mg/dL (ref 8–23)
CO2: 28 mmol/L (ref 22–32)
Calcium: 8.5 mg/dL — ABNORMAL LOW (ref 8.9–10.3)
Chloride: 104 mmol/L (ref 98–111)
Creatinine, Ser: 0.61 mg/dL (ref 0.44–1.00)
GFR calc Af Amer: >60 mL/min (ref >60)
GFR calc non Af Amer: >60 mL/min (ref >60)
Glucose, Bld: 232 mg/dL — ABNORMAL HIGH (ref 70–99)
Potassium: 3.8 mmol/L (ref 3.5–5.1)
Sodium: 137 mmol/L (ref 135–145)

## 2019-10-05 LAB — LIPID PANEL
Cholesterol: 151 mg/dL (ref 0–200)
HDL: 40 mg/dL — ABNORMAL LOW (ref >40)
LDL Cholesterol: 81 mg/dL (ref 0–99)
Total CHOL/HDL Ratio: 3.8 RATIO
Triglycerides: 149 mg/dL (ref <150)
VLDL: 30 mg/dL (ref 0–40)

## 2019-10-05 LAB — PROTIME-INR
INR: 1.3 — ABNORMAL HIGH (ref 0.8–1.2)
Prothrombin Time: 15.9 seconds — ABNORMAL HIGH (ref 11.4–15.2)

## 2019-10-05 LAB — CBG MONITORING, ED
Glucose-Capillary: 186 mg/dL — ABNORMAL HIGH (ref 70–99)
Glucose-Capillary: 130 mg/dL — ABNORMAL HIGH (ref 70–99)

## 2019-10-05 LAB — HEMOGLOBIN A1C
Hgb A1c MFr Bld: 10.5 % — ABNORMAL HIGH (ref 4.8–5.6)
Hgb A1c MFr Bld: 10.6 % — ABNORMAL HIGH (ref 4.8–5.6)
Mean Plasma Glucose: 254.65 mg/dL
Mean Plasma Glucose: 257.52 mg/dL

## 2019-10-05 LAB — TSH: TSH: 0.578 u[IU]/mL (ref 0.350–4.500)

## 2019-10-05 MED ORDER — MIDODRINE HCL 5 MG PO TABS
5.0000 mg | ORAL_TABLET | Freq: Three times a day (TID) | ORAL | 5 refills | Status: DC
Start: 2019-10-05 — End: 2019-10-14

## 2019-10-05 MED ORDER — DILTIAZEM HCL 30 MG PO TABS
30.0000 mg | ORAL_TABLET | Freq: Once | ORAL | Status: AC
  Administered 2019-10-05: 30 mg via ORAL
  Filled 2019-10-05: qty 1

## 2019-10-05 MED ORDER — WARFARIN SODIUM 5 MG PO TABS
5.0000 mg | ORAL_TABLET | Freq: Once | ORAL | Status: AC
  Administered 2019-10-05: 5 mg via ORAL
  Filled 2019-10-05: qty 1

## 2019-10-05 MED ORDER — WARFARIN SODIUM 5 MG PO TABS
5.0000 mg | ORAL_TABLET | Freq: Every evening | ORAL | 6 refills | Status: DC
Start: 2019-10-05 — End: 2020-02-21

## 2019-10-05 MED ORDER — METOPROLOL SUCCINATE ER 25 MG PO TB24
25.0000 mg | ORAL_TABLET | Freq: Two times a day (BID) | ORAL | 5 refills | Status: DC
Start: 2019-10-05 — End: 2019-10-14

## 2019-10-05 MED ORDER — METOPROLOL TARTRATE 25 MG PO TABS
25.0000 mg | ORAL_TABLET | Freq: Once | ORAL | Status: AC
  Administered 2019-10-05: 25 mg via ORAL
  Filled 2019-10-05: qty 1

## 2019-10-05 NOTE — Care Management Obs Status (Signed)
[place] NOTIFICATION   Patient Details  Name: KYNNEDI ZWEIG MRN: [id number] Date of Birth: 05-14-1945   Medicare Observation Status Notification Given:  Yes    Boneta Lucks, RN 10/05/2019, 8:54 AM

## 2019-10-05 NOTE — ED Notes (Signed)
[facility] called to pick up pt for discharge at this time.

## 2019-10-05 NOTE — Discharge Summary (Addendum)
[name], is a 74 y.o. female  DOB 01/15/1946  MRN [id number].  Admission date:  10/04/2019  Admitting Physician  Truett Mainland, DO  Discharge Date:  10/05/2019   Primary MD  [name], MD  Recommendations for primary care physician for things to follow:   1) CBC, BMP and PT/INR every Tuesday x3 weeks starting Tuesday, 10/07/2019 2) please increase Coumadin to 5 mg every evening starting Monday, 10/06/2019 3) please change midodrine to 5 mg 3 times a day starting 10/05/2019 4) please change metoprolol to metoprolol XL 25 mg twice daily for better heart rate control 5) please stop Ambien 6) patient is taking Coumadin/warfarin which is your blood thinner so please Avoid ibuprofen/Advil/Aleve/Motrin/Goody Powders/Naproxen/BC powders/Meloxicam/Diclofenac/Indomethacin and other Nonsteroidal anti-inflammatory medications as these will make you more likely to bleed and can cause stomach ulcers, can also cause Kidney problems.  7)Generalized weakness and deconditioning in the patient with atrial fibrillation--Home health physical therapy and RN ordered  Admission Diagnosis  Atrial fibrillation with RVR ([place]) [I48.91]   Discharge Diagnosis  Atrial fibrillation with RVR ([place]) [I48.91]    Principal Problem:   Atrial fibrillation with RVR (HCC) Active Problems:   Type 2 diabetes mellitus with vascular disease (HCC)   Essential hypertension, benign   DVT, lower extremity, recurrent (HCC)   Chronic anticoagulation   Obesity due to excess calories   History of cerebrovascular accident (CVA) with residual deficit   Schizophrenia ([place])   Orthostatic hypotension   GERD (gastroesophageal reflux disease)      Past Medical History:  Diagnosis Date  . Anxiety   . Arthritis   . COVID-19 virus infection    COVID-19+ approx 01/24/19; asymptomatic course with full recovery  . Dependence on wheelchair     pivot/transfers  . Depression    History of psychosis and previous suicide attempt  . DVT, lower extremity, recurrent (HCC)    Long-term Coumadin per Dr. Legrand Rams  . Essential hypertension   . GERD (gastroesophageal reflux disease)   . Hemiplegia ([place]) 2010   Left side  . History of stroke    Acute infarct and right cerebral white matter small vessel disease 12/10  . Leg DVT (deep venous thromboembolism), acute ([place]) 2006  . Schizophrenia ([place])   . Stroke [facility])    left sided weakness  . Type 2 diabetes mellitus ([place])     Past Surgical History:  Procedure Laterality Date  . BACK SURGERY    . BIOPSY N/A 11/24/2014   Procedure: BIOPSY;  Surgeon: Danie Binder, MD;  Location: [facility] ORS;  Service: Endoscopy;  Laterality: N/A;  . BIOPSY  05/17/2016   Procedure: BIOPSY;  Surgeon: Danie Binder, MD;  Location: [facility] ENDO SUITE;  Service: Endoscopy;;  gastric biopsy  . COLONOSCOPY WITH PROPOFOL N/A 11/24/2014   Dr. Rudie Meyer polyps removed/moderate sized internal hemorrhoids, tubular adenomas. Next surveillance in 3 years  . ESOPHAGOGASTRODUODENOSCOPY (EGD) WITH PROPOFOL N/A 11/24/2014   Dr. Clayburn Pert HH/patent stricture at the gastroesophageal junction, mild non-erosive gastritis, path negative for  H.pylori or celiac sprue  . ESOPHAGOGASTRODUODENOSCOPY (EGD) WITH PROPOFOL N/A 05/17/2016   Procedure: ESOPHAGOGASTRODUODENOSCOPY (EGD) WITH PROPOFOL;  Surgeon: Danie Binder, MD;  Location: [facility];  Service: Endoscopy;  Laterality: N/A;  12:45pm  . GIVENS CAPSULE STUDY N/A 12/11/2014   MULTILPLE EROSION IN the stomach WITH ACTIVE OOZING. OCCASIONAL EROSIONS AND RARE ULCER SEEN IN PROXIMAL SMALL BOWEL . No masses or AVMs SEEN. NO OLD BLOOD OR FRESH BLOOD SEEN.   . POLYPECTOMY N/A 11/24/2014   Procedure: POLYPECTOMY;  Surgeon: Danie Binder, MD;  Location: [facility];  Service: Endoscopy;  Laterality: N/A;  . SAVORY DILATION N/A 05/17/2016   Procedure: SAVORY DILATION;  Surgeon: Danie Binder, MD;   Location: [facility] END[facility];  Service: Endoscopy;  Laterality: N/A;       HPI  from the history and physical done on the day of admission:   HPI: [name] is a 74 y.o. female with a history of CVA with residual left-sided weakness, type 2 diabetes on insulin, history of DVT on chronic anticoagulation (Coumadin), hypertension, orthostatic hypotension, GERD, schizophrenia.  Patient seen for chest discomfort that started earlier today.  Patient is not a good historian.  Unable to give palliating or provoking factors.    Emergency Department Course: On presentation to the ER, found to be tachycardic in the 170s.  She was given a dose of adenosine which slowed her heart rate.  Became evident that she was in atrial fibrillation.  She then began to be tachycardic again in the 130s to 40s.  She was placed on a Cardizem drip      Hospital Course:   1)New onset atrial fibrillation--- patient is back in sinus rhythm after being put on IV Cardizem -TSH is 0.57  CHA2DS2- VASc score   is = 30 (age, gender, dCHF and DM2, prior CVA x 2)    Which is  equal to = 9.7  % annual risk of stroke  -IV Cardizem has been discontinued -Echo from September 2018 with preserved EF of 65 to 70% without significant valvular abnormalities -Patient is already on Coumadin therapy for history of DVT -Patient was also already on metoprolol, discharge been adjusted for better rate control--- patient should take Toprol-XL 25 mg twice daily -Patient was previously on midodrine, this has been changed to 5 mg 3 times daily to avoid hypotension with increased dose of metoprolol for rate control -Repeat echo pending  2)DM2-recent A1c 7.2 reflecting uncontrolled diabetes with hyperglycemia PTA -Continue current regimen -- further adjustment per PCP  3)HFpEF--patient with history of chronic diastolic dysfunction CHF,  --Echo from September 2018 with preserved EF of 65 to 70% with grade 1 diastolic dysfunction at that  time -Given new onset A. fib repeat echo advised as outpatient -Clinically patient appears euvolemic/compensated at this time  4)H/o Prior DVT--- patient is on chronic anticoagulation with Coumadin, INR currently subtherapeutic at 1.3, -Patient received Lovenox -Coumadin has been adjusted to 5 mg daily -Repeat PT/INR and CBC advised for Tuesday, 10/07/2019  5)Orthostatic hypotension--- midodrine has been increased to 5 mg 3 times daily as patient is on increased dose of metoprolol for rate control as above #1  6)Schizophrenia--continue current meds okay to stop Ambien  7) generalized weakness and deconditioning--- Home health RN and physical therapist as ordered due to- Generalized weakness and deconditioning in the patient with atrial fibrillation   Discharge Condition: stable  Follow UP   Follow-up Information    Schedule an appointment as soon as  possible for a visit  with Herminio Commons, MD.   Specialty: Cardiology Why: As needed               Consults obtained -  Diet and Activity recommendation:  As advised  Discharge Instructions     Discharge Instructions    Call MD for:  difficulty breathing, headache or visual disturbances   Complete by: As directed    Call MD for:  persistant dizziness or light-headedness   Complete by: As directed    Call MD for:  persistant nausea and vomiting   Complete by: As directed    Call MD for:  severe uncontrolled pain   Complete by: As directed    Call MD for:  temperature >100.4   Complete by: As directed    Diet - low sodium heart healthy   Complete by: As directed    Diet Carb Modified   Complete by: As directed    Discharge instructions   Complete by: As directed    1) CBC, BMP and PT/INR every Tuesday x3 weeks starting Tuesday, 10/07/2019 2) please increase Coumadin to 5 mg every evening starting Monday, 10/06/2019 3) please change midodrine to 5 mg 3 times a day starting 10/05/2019 4) please change metoprolol to  metoprolol XL 25 mg twice daily for better heart rate control 5) please stop Ambien 6) patient is taking Coumadin/warfarin which is your blood thinner so please Avoid ibuprofen/Advil/Aleve/Motrin/Goody Powders/Naproxen/BC powders/Meloxicam/Diclofenac/Indomethacin and other Nonsteroidal anti-inflammatory medications as these will make you more likely to bleed and can cause stomach ulcers, can also cause Kidney problems.  7)Generalized weakness and deconditioning in the patient with atrial fibrillation--Home health physical therapy and RN ordered   Increase activity slowly   Complete by: As directed         Discharge Medications     Allergies as of 10/05/2019      Reactions   Sulfa Antibiotics Rash      Medication List    STOP taking these medications   zolpidem 10 MG tablet Commonly known as: AMBIEN     TAKE these medications   Abilify 2 MG tablet Generic drug: ARIPiprazole Take 1 tablet (2 mg total) by mouth daily.   acetaminophen 325 MG tablet Commonly known as: TYLENOL Take 650 mg by mouth 3 (three) times daily as needed for mild pain or moderate pain.   calcium carbonate 500 MG chewable tablet Commonly known as: TUMS - dosed in mg elemental calcium Chew 1 tablet by mouth 4 (four) times daily as needed for indigestion or heartburn.   cholecalciferol 1000 units tablet Commonly known as: VITAMIN D Take 2,000 Units by mouth daily.   clonazePAM 0.5 MG tablet Commonly known as: KLONOPIN Take 1 tablet (0.5 mg total) by mouth 3 (three) times daily.   docusate sodium 100 MG capsule Commonly known as: COLACE Take 200 mg by mouth at bedtime.   DULoxetine 60 MG capsule Commonly known as: CYMBALTA Take 120 mg by mouth daily.   fluticasone 50 MCG/ACT nasal spray Commonly known as: FLONASE Place 2 sprays into both nostrils daily as needed for allergies or rhinitis.   gabapentin 300 MG capsule Commonly known as: NEURONTIN Take 300 mg by mouth 3 (three) times daily.     HYDROcodone-acetaminophen 10-325 MG tablet Commonly known as: NORCO Take 1 tablet by mouth in the morning, at noon, and at bedtime.   hydrocortisone 2.5 % rectal cream Commonly known as: ANUSOL-HC Place 1 application rectally 2 (two) times daily  as needed.   iron polysaccharides 150 MG capsule Commonly known as: NIFEREX Take 1 capsule (150 mg total) by mouth daily.   latanoprost 0.005 % ophthalmic solution Commonly known as: XALATAN Place 1 drop into both eyes at bedtime.   Levemir FlexTouch 100 UNIT/ML FlexPen Generic drug: insulin detemir INJECT 28 UNITS INTO THE SKIN AT BEDTIME.   loratadine 10 MG tablet Commonly known as: CLARITIN Take 10 mg by mouth daily.   lubiprostone 24 MCG capsule Commonly known as: AMITIZA Take 1 capsule (24 mcg total) by mouth 2 (two) times daily with a meal.   metoprolol succinate 25 MG 24 hr tablet Commonly known as: TOPROL-XL Take 1 tablet (25 mg total) by mouth 2 (two) times daily. For Heart What changed:   how much to take  when to take this  additional instructions   midodrine 5 MG tablet Commonly known as: PROAMATINE Take 1 tablet (5 mg total) by mouth 3 (three) times daily with meals. What changed:   medication strength  when to take this   mirtazapine 30 MG tablet Commonly known as: REMERON Take 30 mg by mouth at bedtime.   Myrbetriq 25 MG Tb24 tablet Generic drug: mirabegron ER Take 25 mg by mouth daily.   ondansetron 4 MG tablet Commonly known as: ZOFRAN Take 1 tablet (4 mg total) by mouth 4 (four) times daily -  before meals and at bedtime. What changed:   when to take this  reasons to take this   pantoprazole 40 MG tablet Commonly known as: PROTONIX Take 1 tablet (40 mg total) by mouth 2 (two) times daily before a meal.   phenol 1.4 % Liqd Commonly known as: CHLORASEPTIC Use as directed 1 spray in the mouth or throat every 6 (six) hours as needed for throat irritation / pain.   polyethylene glycol 17  g packet Commonly known as: MIRALAX / GLYCOLAX Take 17 g by mouth 2 (two) times daily as needed for mild constipation or moderate constipation.   risperiDONE 1 MG tablet Commonly known as: RISPERDAL Take 1 tablet (1 mg total) by mouth in the morning and at bedtime.   simvastatin 20 MG tablet Commonly known as: ZOCOR Take 20 mg by mouth at bedtime.   tiZANidine 2 MG tablet Commonly known as: ZANAFLEX Take 2 mg by mouth daily.   warfarin 5 MG tablet Commonly known as: COUMADIN Take as directed. If you are unsure how to take this medication, talk to your nurse or doctor. Original instructions: Take 1 tablet (5 mg total) by mouth every evening. What changed:   how much to take  how to take this  when to take this  additional instructions       Major procedures and Radiology Reports - PLEASE review detailed and final reports for all details, in brief -      DG Chest Port 1 View  Result Date: 10/04/2019 CLINICAL DATA:  Shortness of breath EXAM: PORTABLE CHEST 1 VIEW COMPARISON:  March 28 2017. FINDINGS: There is mild scarring in the upper lobe regions. There is no edema or airspace opacity. Heart is upper normal in size with pulmonary vascularity normal. No adenopathy. Degenerative change noted in each shoulder. IMPRESSION: Mild upper lobe scarring bilaterally. No edema or airspace opacity. Heart upper normal in size. No adenopathy evident. Electronically Signed   By: Lowella Grip III M.D.   On: 10/04/2019 12:54    Micro Results     Recent Results (from the past 240 hour(s))  SARS Coronavirus 2 by RT PCR (hospital order, performed in [facility] hospital lab) Nasopharyngeal Nasopharyngeal Swab     Status: None   Collection Time: 10/04/19 12:11 PM   Specimen: Nasopharyngeal Swab  Result Value Ref Range Status   SARS Coronavirus 2 NEGATIVE NEGATIVE Final    Comment: (NOTE) SARS-CoV-2 target nucleic acids are NOT DETECTED.  The SARS-CoV-2 RNA is generally detectable  in upper and lower respiratory specimens during the acute phase of infection. The lowest concentration of SARS-CoV-2 viral copies this assay can detect is 250 copies / mL. A negative result does not preclude SARS-CoV-2 infection and should not be used as the sole basis for treatment or other patient management decisions.  A negative result may occur with improper specimen collection / handling, submission of specimen other than nasopharyngeal swab, presence of viral mutation(s) within the areas targeted by this assay, and inadequate number of viral copies (<250 copies / mL). A negative result must be combined with clinical observations, patient history, and epidemiological information.  Fact Sheet for Patients:   StrictlyIdeas.no  Fact Sheet for Healthcare Providers: BankingDealers.co.za  This test is not yet approved or  cleared by the [place] FDA and has been authorized for detection and/or diagnosis of SARS-CoV-2 by FDA under an Emergency Use Authorization (EUA).  This EUA will remain in effect (meaning this test can be used) for the duration of the COVID-19 declaration under Section 564(b)(1) of the Act, 21 U.S.C. section 360bbb-3(b)(1), unless the authorization is terminated or revoked sooner.  Performed at [facility], [address] [place], [place] [address]        Today   Subjective    [name] today has no new complaints, resting comfortably, =-Specifically no chest pains, no shortness of breath, no palpitations, no dizziness          Patient has been seen and examined prior to discharge   Objective   Blood pressure 101/62, pulse 75, temperature 97.6 F (36.4 C), temperature source Oral, resp. rate 20, height 5\' 5"  (1.651 m), weight 53.1 kg, SpO2 95 %.  No intake or output data in the 24 hours ending 10/05/19 0918  Exam Gen:- Awake Alert, no acute distress  HEENT:- .AT, No sclera  icterus Neck-Supple Neck,No JVD,.  Lungs-  CTAB , good air movement bilaterally  CV- S1, S2 normal, regular (regular at this time with no evidence of atrial fibrillation) Abd-  +ve B.Sounds, Abd Soft, No tenderness,    Extremity/Skin:- No  edema,   good pulses Psych-affect is appropriate, oriented x3 Neuro-generalized weakness and deconditioning, no new focal deficits, no tremors    Data Review   CBC w Diff:  Lab Results  Component Value Date   WBC 6.9 10/05/2019   HGB 13.2 10/05/2019   HCT 39.8 10/05/2019   PLT 231 10/05/2019   LYMPHOPCT 27 06/27/2017   MONOPCT 8 06/27/2017   EOSPCT 2 06/27/2017   BASOPCT 0 06/27/2017    CMP:  Lab Results  Component Value Date   NA 137 10/05/2019   NA 141 09/06/2016   K 3.8 10/05/2019   CL 104 10/05/2019   CO2 28 10/05/2019   BUN 13 10/05/2019   BUN 10 09/06/2016   CREATININE 0.61 10/05/2019   CREATININE 0.60 06/27/2018   PROT 6.1 (L) 10/04/2019   PROT 7.4 09/06/2016   ALBUMIN 3.6 10/04/2019   ALBUMIN 4.4 09/06/2016   BILITOT 0.5 10/04/2019   BILITOT 0.3 09/06/2016   ALKPHOS 61 10/04/2019   AST  11 (L) 10/04/2019   ALT 10 10/04/2019  .  Generalized weakness and deconditioning in the patient with atrial fibrillation--Home health physical therapy and RN ordered  Total Discharge time is about 33 minutes  Roxan Hockey M.D on 10/05/2019 at 9:18 AM  Go to [email or web address] -  for contact info  [facility] - Office  [phone]

## 2019-10-05 NOTE — Care Management CC44 (Signed)
Condition Code 44 Documentation Completed  Patient Details  Name: [name] MRN: [id number] Date of Birth: 1945-10-17   Condition Code 44 given:  Yes Patient signature on Condition Code 44 notice:  Yes Documentation of 2 MD's agreement:  Yes Code 44 added to claim:  Yes    Boneta Lucks, RN 10/05/2019, 8:54 AM

## 2019-10-05 NOTE — Progress Notes (Signed)
*  PRELIMINARY RESULTS* Echocardiogram 2D Echocardiogram has been performed.  [name] 10/05/2019, 9:22 AM

## 2019-10-05 NOTE — TOC Transition Note (Signed)
Transition of Care [facility]) - CM/SW Discharge Note   Patient Details  Name: [name] MRN: [id number] Date of Birth: [birth year]  [facility]) CM/SW Contact:  Boneta Lucks, RN Phone Number: 10/05/2019, 12:56 PM   Clinical Narrative:   Patient discharging from ED back to [facility].  MD ordering HHPT/RN.  Cassie with [facility] accepted the referral. [facility] updated with patient discharge plan.    Final next level of care: [place] Barriers to Discharge: Barriers Resolved   Patient Goals and CMS Choice Patient states their goals for this hospitalization and ongoing recovery are:: to return to ALF CMS Medicare.gov Compare Post Acute Care list provided to:: Patient Choice offered to / list presented to : Patient  Discharge Placement            Name of family member notified: [place] Patient and family notified of of transfer: 10/05/19  Discharge Plan and Services      HH Arranged: RN, PT [facility] Agency: [facility] Date [place]: 10/05/19 Time [place]: [phone] Representative spoke with at [place]

## 2019-10-05 NOTE — Discharge Instructions (Signed)
1) CBC, BMP and PT/INR every Tuesday x3 weeks starting Tuesday, 10/07/2019 2) please increase Coumadin to 5 mg every evening starting Monday, 10/06/2019 3) please change midodrine to 5 mg 3 times a day starting 10/05/2019 4) please change metoprolol to metoprolol XL 25 mg twice daily for better heart rate control 5) please stop Ambien 6) patient is taking Coumadin/warfarin which is your blood thinner so please Avoid ibuprofen/Advil/Aleve/Motrin/Goody Powders/Naproxen/BC powders/Meloxicam/Diclofenac/Indomethacin and other Nonsteroidal anti-inflammatory medications as these will make you more likely to bleed and can cause stomach ulcers, can also cause Kidney problems.  7)Generalized weakness and deconditioning in the patient with atrial fibrillation--Home health physical therapy and RN ordered

## 2019-10-06 DIAGNOSIS — U071 COVID-19: Secondary | ICD-10-CM | POA: Diagnosis not present

## 2019-10-06 DIAGNOSIS — Z20828 Contact with and (suspected) exposure to other viral communicable diseases: Secondary | ICD-10-CM | POA: Diagnosis not present

## 2019-10-06 DIAGNOSIS — R278 Other lack of coordination: Secondary | ICD-10-CM | POA: Diagnosis not present

## 2019-10-06 DIAGNOSIS — G8194 Hemiplegia, unspecified affecting left nondominant side: Secondary | ICD-10-CM | POA: Diagnosis not present

## 2019-10-07 DIAGNOSIS — I825Y9 Chronic embolism and thrombosis of unspecified deep veins of unspecified proximal lower extremity: Secondary | ICD-10-CM | POA: Diagnosis not present

## 2019-10-07 DIAGNOSIS — E1165 Type 2 diabetes mellitus with hyperglycemia: Secondary | ICD-10-CM | POA: Diagnosis not present

## 2019-10-07 DIAGNOSIS — M6281 Muscle weakness (generalized): Secondary | ICD-10-CM | POA: Diagnosis not present

## 2019-10-07 DIAGNOSIS — R2681 Unsteadiness on feet: Secondary | ICD-10-CM | POA: Diagnosis not present

## 2019-10-07 DIAGNOSIS — I10 Essential (primary) hypertension: Secondary | ICD-10-CM | POA: Diagnosis not present

## 2019-10-07 DIAGNOSIS — D649 Anemia, unspecified: Secondary | ICD-10-CM | POA: Diagnosis not present

## 2019-10-07 DIAGNOSIS — F333 Major depressive disorder, recurrent, severe with psychotic symptoms: Secondary | ICD-10-CM | POA: Diagnosis not present

## 2019-10-07 DIAGNOSIS — I4891 Unspecified atrial fibrillation: Secondary | ICD-10-CM | POA: Diagnosis not present

## 2019-10-07 DIAGNOSIS — E1142 Type 2 diabetes mellitus with diabetic polyneuropathy: Secondary | ICD-10-CM | POA: Diagnosis not present

## 2019-10-07 DIAGNOSIS — Z0001 Encounter for general adult medical examination with abnormal findings: Secondary | ICD-10-CM | POA: Diagnosis not present

## 2019-10-07 DIAGNOSIS — M545 Low back pain: Secondary | ICD-10-CM | POA: Diagnosis not present

## 2019-10-08 ENCOUNTER — Telehealth (INDEPENDENT_AMBULATORY_CARE_PROVIDER_SITE_OTHER): Payer: Medicare Other | Admitting: Psychiatry

## 2019-10-08 ENCOUNTER — Telehealth (HOSPITAL_COMMUNITY): Payer: Medicare Other | Admitting: Psychiatry

## 2019-10-08 ENCOUNTER — Other Ambulatory Visit: Payer: Self-pay

## 2019-10-08 ENCOUNTER — Encounter (HOSPITAL_COMMUNITY): Payer: Self-pay | Admitting: Psychiatry

## 2019-10-08 ENCOUNTER — Ambulatory Visit (INDEPENDENT_AMBULATORY_CARE_PROVIDER_SITE_OTHER): Payer: Medicare Other | Admitting: *Deleted

## 2019-10-08 DIAGNOSIS — Z8679 Personal history of other diseases of the circulatory system: Secondary | ICD-10-CM

## 2019-10-08 DIAGNOSIS — F330 Major depressive disorder, recurrent, mild: Secondary | ICD-10-CM

## 2019-10-08 DIAGNOSIS — F209 Schizophrenia, unspecified: Secondary | ICD-10-CM

## 2019-10-08 DIAGNOSIS — I4891 Unspecified atrial fibrillation: Secondary | ICD-10-CM | POA: Diagnosis not present

## 2019-10-08 DIAGNOSIS — Z5181 Encounter for therapeutic drug level monitoring: Secondary | ICD-10-CM

## 2019-10-08 DIAGNOSIS — R278 Other lack of coordination: Secondary | ICD-10-CM | POA: Diagnosis not present

## 2019-10-08 DIAGNOSIS — G8194 Hemiplegia, unspecified affecting left nondominant side: Secondary | ICD-10-CM | POA: Diagnosis not present

## 2019-10-08 LAB — POCT INR: INR: 1.9 — AB (ref 2.0–3.0)

## 2019-10-08 MED ORDER — ABILIFY 2 MG PO TABS: 2.0000 mg | ORAL_TABLET | Freq: Every day | ORAL | 2 refills | Status: DC

## 2019-10-08 MED ORDER — RISPERIDONE 1 MG PO TABS: 1.0000 mg | ORAL_TABLET | Freq: Two times a day (BID) | ORAL | 2 refills | Status: DC

## 2019-10-08 NOTE — Patient Instructions (Signed)
Increase warfarin to 1 tablet daily except 1/2 tablet on Mondays  Recheck in 4 weeks Orders written for [facility] [phone]

## 2019-10-08 NOTE — Patient Instructions (Signed)
1. ContinueAbilify2 mg daily 2. Continue Risperidone 1 mg twice a day  3.Next appointment: 12/8 at 11 AM  - Fax AVS to [phone]

## 2019-10-09 DIAGNOSIS — M6281 Muscle weakness (generalized): Secondary | ICD-10-CM | POA: Diagnosis not present

## 2019-10-09 DIAGNOSIS — M545 Low back pain: Secondary | ICD-10-CM | POA: Diagnosis not present

## 2019-10-09 DIAGNOSIS — R2681 Unsteadiness on feet: Secondary | ICD-10-CM | POA: Diagnosis not present

## 2019-10-09 DIAGNOSIS — Z20828 Contact with and (suspected) exposure to other viral communicable diseases: Secondary | ICD-10-CM | POA: Diagnosis not present

## 2019-10-09 DIAGNOSIS — U071 COVID-19: Secondary | ICD-10-CM | POA: Diagnosis not present

## 2019-10-13 DIAGNOSIS — G8194 Hemiplegia, unspecified affecting left nondominant side: Secondary | ICD-10-CM | POA: Diagnosis not present

## 2019-10-13 DIAGNOSIS — R278 Other lack of coordination: Secondary | ICD-10-CM | POA: Diagnosis not present

## 2019-10-13 NOTE — Progress Notes (Signed)
Cardiology Office Note  Date: 10/14/2019   ID: [name], DOB 1945/06/03, MRN [id number]  PCP:  [name], MD  Cardiologist:  Rozann Lesches, MD Electrophysiologist:  None   Chief Complaint: History of DVT, CVA, DM 2, hypertension  History of Present Illness: [name] is a 74 y.o. female with a history of CVA, DM 2, HTN, DVT .  Last saw Dr. Domenic Polite on 10/23/2018 via telemedicine visit.  History of DVT and CVA on chronic Coumadin under the direction of Dr. Henderson Cloud.  Is followed in anticoagulation clinic.  No reported bleeding episodes.  History of SVT no recent episodes she was on low-dose Toprol-XL.  Continuing on midodrine for orthostatic hypotension.  Recent admission on 10/04/2019 for atrial fibrillation with RVR.  She presented with chest discomfort that started earlier in the day.  On presentation to ER she is found to be tachycardic in the 170s.  She was given dose of adenosine which slowed her heart rate.  It became evident that she was in atrial fibrillation.  She began to be tachycardic again in the 130s to 140s.  She was a placed on a Cardizem drip.  She converted back to normal sinus rhythm after being started on IV Cardizem.  CHA2DS2-VASc score was 6, equal to over 9.7% annual risk of stroke.  Given new onset atrial fibrillation repeat echo was advised as an outpatient.  History of HFpEF chronic diastolic CHF.  Echo from September 2018 with EF 65 to 70%, grade 1 DD.  History of prior DVT and CVA on chronic anticoagulation with INR currently subtherapeutic at 1.3.  She received Lovenox while in the hospital.  Coumadin was adjusted to 5 mg daily.  Repeat PT/INR and CBC advised for Tuesday, 10/07/2019.  Orthostatic hypotension.  Midodrine was increased to 5 mg 3 times daily due to being on increased dose of metoprolol for rate control.  Recent A1c was 7.2% reflecting uncontrolled diabetes with hyperglycemia prior to admission.  She presents today for hospital follow-up  with continued complaints of constant chest pain with no aggravating or alleviating factors other than she states her left chest is a little sore when she presses on it.  Heart rate is 105 and irregularly irregular on arrival.  States she feels short of breath with and without exertion.  Recent echocardiogram on 9     Past Medical History:  Diagnosis Date  . Anxiety   . Arthritis   . COVID-19 virus infection    COVID-19+ approx 01/24/19; asymptomatic course with full recovery  . Dependence on wheelchair    pivot/transfers  . Depression    History of psychosis and previous suicide attempt  . DVT, lower extremity, recurrent (HCC)    Long-term Coumadin per Dr. Legrand Rams  . Essential hypertension   . GERD (gastroesophageal reflux disease)   . Hemiplegia ([place]) 2010   Left side  . History of stroke    Acute infarct and right cerebral white matter small vessel disease 12/10  . Leg DVT (deep venous thromboembolism), acute ([place]) 2006  . Schizophrenia ([place])   . Stroke [facility])    left sided weakness  . Type 2 diabetes mellitus ([place])     Past Surgical History:  Procedure Laterality Date  . BACK SURGERY    . BIOPSY N/A 11/24/2014   Procedure: BIOPSY;  Surgeon: Danie Binder, MD;  Location: [facility] ORS;  Service: Endoscopy;  Laterality: N/A;  . BIOPSY  05/17/2016   Procedure: BIOPSY;  Surgeon: Marga Melnick  Fields, MD;  Location: [facility] END[facility] SU[facility];  Service: Endoscopy;;  gastric biopsy  . COLONOSCOPY WITH PROPOFOL N/A 11/24/2014   Dr. Rudie Meyer polyps removed/moderate sized internal hemorrhoids, tubular adenomas. Next surveillance in 3 years  . ESOPHAGOGASTRODUODENOSCOPY (EGD) WITH PROPOFOL N/A 11/24/2014   Dr. Clayburn Pert HH/patent stricture at the gastroesophageal junction, mild non-erosive gastritis, path negative for H.pylori or celiac sprue  . ESOPHAGOGASTRODUODENOSCOPY (EGD) WITH PROPOFOL N/A 05/17/2016   Procedure: ESOPHAGOGASTRODUODENOSCOPY (EGD) WITH PROPOFOL;  Surgeon: Danie Binder, MD;  Location: [facility]  END[facility] SU[facility];  Service: Endoscopy;  Laterality: N/A;  12:45pm  . GIVENS CAPSULE STUDY N/A 12/11/2014   MULTILPLE EROSION IN the stomach WITH ACTIVE OOZING. OCCASIONAL EROSIONS AND RARE ULCER SEEN IN PROXIMAL SMALL BOWEL . No masses or AVMs SEEN. NO OLD BLOOD OR FRESH BLOOD SEEN.   . POLYPECTOMY N/A 11/24/2014   Procedure: POLYPECTOMY;  Surgeon: Danie Binder, MD;  Location: [facility];  Service: Endoscopy;  Laterality: N/A;  . SAVORY DILATION N/A 05/17/2016   Procedure: SAVORY DILATION;  Surgeon: Danie Binder, MD;  Location: [facility] END[facility];  Service: Endoscopy;  Laterality: N/A;    Current Outpatient Medications  Medication Sig Dispense Refill  . [START ON 10/19/2019] ABILIFY 2 MG tablet Take 1 tablet (2 mg total) by mouth daily. 28 tablet 2  . acetaminophen (TYLENOL) 325 MG tablet Take 650 mg by mouth 3 (three) times daily as needed for mild pain or moderate pain.     . calcium carbonate (TUMS - DOSED IN MG ELEMENTAL CALCIUM) 500 MG chewable tablet Chew 1 tablet by mouth 4 (four) times daily as needed for indigestion or heartburn.     . Carboxymethylcellulose Sodium (ARTIFICIAL TEARS OP) Place 1 drop into both eyes 3 (three) times daily.    . Cholecalciferol (VITAMIN D3) 50 MCG (2000 UT) TABS Take 1 tablet by mouth daily.    . clonazePAM (KLONOPIN) 0.5 MG tablet Take 1 tablet (0.5 mg total) by mouth 3 (three) times daily. 90 tablet 2  . docusate sodium (COLACE) 100 MG capsule Take 200 mg by mouth at bedtime.     . DULoxetine (CYMBALTA) 60 MG capsule Take 120 mg by mouth daily.     . fluticasone (FLONASE) 50 MCG/ACT nasal spray Place 2 sprays into both nostrils daily as needed for allergies or rhinitis.    Marland Kitchen gabapentin (NEURONTIN) 300 MG capsule Take 300 mg by mouth 3 (three) times daily.    Marland Kitchen HYDROcodone-acetaminophen (NORCO) 10-325 MG tablet Take 1 tablet by mouth in the morning, at noon, and at bedtime.     . hydrocortisone (ANUSOL-HC) 2.5 % rectal cream Place 1 application rectally 2 (two) times  daily as needed.     . iron polysaccharides (NIFEREX) 150 MG capsule Take 1 capsule (150 mg total) by mouth daily. 30 capsule 11  . latanoprost (XALATAN) 0.005 % ophthalmic solution Place 1 drop into both eyes at bedtime.    Marland Kitchen LEVEMIR FLEXTOUCH 100 UNIT/ML Pen INJECT 28 UNITS INTO THE SKIN AT BEDTIME. 15 mL 3  . loratadine (CLARITIN) 10 MG tablet Take 10 mg by mouth daily.    Marland Kitchen lubiprostone (AMITIZA) 24 MCG capsule Take 1 capsule (24 mcg total) by mouth 2 (two) times daily with a meal. 60 capsule 5  . metoprolol succinate (TOPROL-XL) 25 MG 24 hr tablet Take 1 tablet (25 mg total) by mouth daily. 30 tablet 6  . midodrine (PROAMATINE) 5 MG tablet Take 1 tablet (5 mg total) by mouth 2 (two) times  daily. 60 tablet 6  . mirabegron ER (MYRBETRIQ) 25 MG TB24 tablet Take 25 mg by mouth daily.    . mirtazapine (REMERON) 30 MG tablet Take 30 mg by mouth at bedtime.    . ondansetron (ZOFRAN) 4 MG tablet Take 1 tablet (4 mg total) by mouth 4 (four) times daily -  before meals and at bedtime. (Patient taking differently: Take 4 mg by mouth 2 (two) times daily as needed for nausea or vomiting. ) 120 tablet 3  . pantoprazole (PROTONIX) 40 MG tablet Take 1 tablet (40 mg total) by mouth 2 (two) times daily before a meal. 270 tablet 3  . phenol (CHLORASEPTIC) 1.4 % LIQD Use as directed 1 spray in the mouth or throat every 6 (six) hours as needed for throat irritation / pain.    . polyethylene glycol (MIRALAX / GLYCOLAX) 17 g packet Take 17 g by mouth 2 (two) times daily as needed for mild constipation or moderate constipation.    Derrill Memo ON 10/18/2019] risperiDONE (RISPERDAL) 1 MG tablet Take 1 tablet (1 mg total) by mouth in the morning and at bedtime. 56 tablet 2  . simvastatin (ZOCOR) 20 MG tablet Take 20 mg by mouth at bedtime.     Marland Kitchen tiZANidine (ZANAFLEX) 2 MG tablet Take 2 mg by mouth daily.    Marland Kitchen warfarin (COUMADIN) 5 MG tablet Take 1 tablet (5 mg total) by mouth every evening. 30 tablet 6  . zolpidem (AMBIEN)  10 MG tablet Take 10 mg by mouth at bedtime.    Marland Kitchen diltiazem (CARDIZEM CD) 120 MG 24 hr capsule Take 1 capsule (120 mg total) by mouth daily. 30 capsule 6   No current facility-administered medications for this visit.   Allergies:  Sulfa antibiotics   Social History: The patient  reports that she quit smoking about 24 years ago. Her smoking use included cigarettes. She has a 5.00 pack-year smoking history. She has never used smokeless tobacco. She reports that she does not drink alcohol and does not use drugs.   Family History: The patient's family history includes Hypertension in her mother.   ROS:  Please see the history of present illness. Otherwise, complete review of systems is positive for none.  All other systems are reviewed and negative.   Physical Exam: VS:  BP (!) 114/58   Pulse (!) 105   Ht 5\' 5"  (1.651 m)   Wt 128 lb (58.1 kg)   SpO2 95%   BMI 21.30 kg/m , BMI Body mass index is 21.3 kg/m.  Wt Readings from Last 3 Encounters:  10/14/19 128 lb (58.1 kg)  10/04/19 117 lb (53.1 kg)  08/26/19 122 lb (55.3 kg)    General: Patient appears comfortable at rest. Neck: Supple, no elevated JVP or carotid bruits, no thyromegaly. Lungs: Clear to auscultation, nonlabored breathing at rest. Cardiac: Irregularly irregular tachycardic rate and rhythm, no S3 or significant systolic murmur, no pericardial rub. Extremities: No pitting edema, distal pulses 2+. Skin: Warm and dry. Musculoskeletal: No kyphosis. Neuropsychiatric: Alert and oriented x3, affect grossly appropriate.  ECG:  An ECG dated 10/14/2019 was personally reviewed today and demonstrated:  Atrial flutter with variable AV block rate of 129  Recent Labwork: 10/04/2019: ALT 10; AST 11; B Natriuretic Peptide 311.0 10/05/2019: BUN 13; Creatinine, Ser 0.61; Hemoglobin 13.2; Platelets 231; Potassium 3.8; Sodium 137; TSH 0.578     Component Value Date/Time   CHOL 151 10/05/2019 0457   TRIG 149 10/05/2019 0457   HDL 40 (L)  10/05/2019 0457   CHOLHDL 3.8 10/05/2019 0457   VLDL 30 10/05/2019 0457   LDLCALC 81 10/05/2019 0457    Other Studies Reviewed Today:   Echocardiogram 10/05/2019 IMPRESSIONS    1. Left ventricular ejection fraction, by estimation, is 60 to 65%. The  left ventricle has normal function. The left ventricle has no regional  wall motion abnormalities. There is moderate concentric left ventricular  hypertrophy and severe basal septal  hypertrophy .  2. Right ventricular systolic function is normal. The right ventricular  size is normal. Tricuspid regurgitation signal is inadequate for assessing  PA pressure.  3. Left atrial size was mildly dilated.  4. The mitral valve is normal in structure. Mild mitral valve  regurgitation. No evidence of mitral stenosis. Moderate mitral annular  calcification.  5. The aortic valve is tricuspid. Aortic valve regurgitation is not  visualized. Mild to moderate aortic valve sclerosis/calcification is  present, without any evidence of aortic stenosis.  6. The inferior vena cava is normal in size with greater than 50%  respiratory variability, suggesting right atrial pressure of 3 mmHg.    Echocardiogram 10/06/2016: Study Conclusions  - Left ventricle: The cavity size was normal. Systolic function was vigorous. The estimated ejection fraction was in the range of 65% to 70%. Wall motion was normal; there were no regional wall motion abnormalities. Doppler parameters are consistent with abnormal left ventricular relaxation (grade 1 diastolic dysfunction). Doppler parameters are consistent with high ventricular filling pressure. Moderate concentric and severe focal basal septal hypertrophy. - Aortic valve: Moderately calcified annulus. Trileaflet. - Mitral valve: Calcified annulus.   Assessment and Plan:  1. Other chest pain   2. Dyspnea, unspecified type   3. Atrial fibrillation with RVR ([place])   4. History of DVT (deep  vein thrombosis)   5. History of CVA (cerebrovascular accident)   6. History of supraventricular tachycardia   7. Orthostatic hypotension    1. Other chest pain Complaining of constant left precordial chest pain.  States she sometimes has nausea.  Denies any other aggravating or alleviating factors.  States she has been taking some Tums.  She does have gastroesophageal reflux disease for which she takes Protonix.  Get Lexiscan stress test to rule out CAD.  2. Dyspnea, unspecified type 10/05/2019 showed EF of 60 to 65%.  Moderate concentric LVH and severe basal septal hypertrophy.  Has a distinct murmur in the right upper sternal border area 2-3 out of 6.  Has some mitral valve regurgitation which is mild.  Aortic valve is tricuspid.  Aortic valve regurgitation not visualized.  Mild to moderate aortic sclerosis/calcification without any evidence of aortic stenosis.  Her EKG shows a rapid atrial flutter with variable AV block with a rate of 129 which more than likely is causing her dyspnea.  We are starting Cardizem CD 120 mg for rate control.  3. Atrial fibrillation with RVR ([place]) Recent hospitalization for rapid atrial fibrillation.  Her Toprol-XL was increased to 25 mg p.o. twice daily.  Heart rate today is 105 and irregularly irregular.  EKG today shows atrial flutter with variable AV block rate of 129.  Start Cardizem CD 120 mg.  Decrease Toprol XL to 25 mg daily.  Continue Coumadin 5 mg daily.  4. History of DVT (deep vein thrombosis) No recent DVT or PE-like symptoms.  She is on Coumadin 5 mg daily.  5. History of CVA (cerebrovascular accident) History of CVA but no new stroke or TIA-like symptoms.  Patient is able to communicate  but she does have schizophrenia and has some difficulty expressing but is understandable.  6. History of supraventricular tachycardia EKG today shows atrial flutter with variable AV block rate of 129.  We are starting Cardizem 120 mg CD for rate control.  7.  Orthostatic hypotension Recent adjustments to midodrine up to 5 mg p.o. 3 times daily.  Decrease midodrine to 5 mg p.o. twice daily.  Medication Adjustments/Labs and Tests Ordered: Current medicines are reviewed at length with the patient today.  Concerns regarding medicines are outlined above.   Disposition: Follow-up with Dr. Domenic Polite or APP 1 month  Signed, Levell July, NP 10/14/2019 10:36 AM    [facility], [place], [place] [address] Phone: [phone]; Fax: [phone]

## 2019-10-14 ENCOUNTER — Encounter: Payer: Self-pay | Admitting: Family Medicine

## 2019-10-14 ENCOUNTER — Ambulatory Visit (INDEPENDENT_AMBULATORY_CARE_PROVIDER_SITE_OTHER): Payer: Medicare Other | Admitting: Family Medicine

## 2019-10-14 ENCOUNTER — Encounter: Payer: Self-pay | Admitting: *Deleted

## 2019-10-14 VITALS — BP 114/58 | HR 105 | Ht 65.0 in | Wt 128.0 lb

## 2019-10-14 DIAGNOSIS — Z8679 Personal history of other diseases of the circulatory system: Secondary | ICD-10-CM

## 2019-10-14 DIAGNOSIS — I4891 Unspecified atrial fibrillation: Secondary | ICD-10-CM | POA: Diagnosis not present

## 2019-10-14 DIAGNOSIS — I951 Orthostatic hypotension: Secondary | ICD-10-CM

## 2019-10-14 DIAGNOSIS — Z86718 Personal history of other venous thrombosis and embolism: Secondary | ICD-10-CM

## 2019-10-14 DIAGNOSIS — Z8673 Personal history of transient ischemic attack (TIA), and cerebral infarction without residual deficits: Secondary | ICD-10-CM

## 2019-10-14 DIAGNOSIS — R0789 Other chest pain: Secondary | ICD-10-CM | POA: Diagnosis not present

## 2019-10-14 DIAGNOSIS — R0600 Dyspnea, unspecified: Secondary | ICD-10-CM

## 2019-10-14 DIAGNOSIS — M545 Low back pain: Secondary | ICD-10-CM | POA: Diagnosis not present

## 2019-10-14 DIAGNOSIS — R2681 Unsteadiness on feet: Secondary | ICD-10-CM | POA: Diagnosis not present

## 2019-10-14 DIAGNOSIS — M6281 Muscle weakness (generalized): Secondary | ICD-10-CM | POA: Diagnosis not present

## 2019-10-14 MED ORDER — DILTIAZEM HCL ER COATED BEADS 120 MG PO CP24: 120.0000 mg | ORAL_CAPSULE | Freq: Every day | ORAL | 6 refills | Status: DC

## 2019-10-14 MED ORDER — MIDODRINE HCL 5 MG PO TABS
5.0000 mg | ORAL_TABLET | Freq: Two times a day (BID) | ORAL | 6 refills | Status: DC
Start: 2019-10-14 — End: 2019-11-06

## 2019-10-14 MED ORDER — METOPROLOL SUCCINATE ER 25 MG PO TB24
25.0000 mg | ORAL_TABLET | Freq: Every day | ORAL | 6 refills | Status: DC
Start: 2019-10-14 — End: 2019-11-27

## 2019-10-14 NOTE — Patient Instructions (Addendum)
Medication Instructions:   Decrease Toprol XL to 25mg  daily.  Begin Diltiazem CD 120mg  daily.   Decrease Midodrine to 5mg  twice a day.    Continue all other medications.    Labwork: none  Testing/Procedures:  Your physician has requested that you have a lexiscan myoview. For further information please visit [email or web address]. Please follow instruction sheet, as given.  Office will contact with results via phone or letter.    Follow-Up: 1 month   Any Other Special Instructions Will Be Listed Below (If Applicable).  If you need a refill on your cardiac medications before your next appointment, please call your pharmacy.

## 2019-10-16 ENCOUNTER — Encounter (HOSPITAL_COMMUNITY): Payer: Self-pay

## 2019-10-16 ENCOUNTER — Other Ambulatory Visit: Payer: Self-pay

## 2019-10-16 ENCOUNTER — Encounter (HOSPITAL_BASED_OUTPATIENT_CLINIC_OR_DEPARTMENT_OTHER)
Admission: RE | Admit: 2019-10-16 | Discharge: 2019-10-16 | Disposition: A | Discharge status: Home / Self Care | Payer: Medicare Other | Source: Ambulatory Visit | Attending: Family Medicine | Admitting: Family Medicine

## 2019-10-16 ENCOUNTER — Encounter (HOSPITAL_COMMUNITY)
Admission: RE | Admit: 2019-10-16 | Discharge: 2019-10-16 | Disposition: A | Discharge status: Home / Self Care | Payer: Medicare Other | Source: Ambulatory Visit | Attending: Family Medicine | Admitting: Family Medicine

## 2019-10-16 DIAGNOSIS — R0789 Other chest pain: Secondary | ICD-10-CM | POA: Diagnosis not present

## 2019-10-16 DIAGNOSIS — M6281 Muscle weakness (generalized): Secondary | ICD-10-CM | POA: Diagnosis not present

## 2019-10-16 DIAGNOSIS — M545 Low back pain: Secondary | ICD-10-CM | POA: Diagnosis not present

## 2019-10-16 DIAGNOSIS — R2681 Unsteadiness on feet: Secondary | ICD-10-CM | POA: Diagnosis not present

## 2019-10-16 LAB — NM MYOCAR MULTI W/SPECT W/WALL MOTION / EF
LV dias vol: 74 mL (ref 46–106)
LV sys vol: 36 mL
Peak HR: 126 {beats}/min
RATE: 0.34
Rest HR: 93 {beats}/min
SDS: 1
SRS: 1
SSS: 2
TID: 1.18

## 2019-10-16 MED ORDER — TECHNETIUM TC 99M TETROFOSMIN IV KIT
10.0000 | PACK | Freq: Once | INTRAVENOUS | Status: AC | PRN
  Administered 2019-10-16: 10.72 via INTRAVENOUS

## 2019-10-16 MED ORDER — REGADENOSON 0.4 MG/5ML IV SOLN
INTRAVENOUS | Status: AC
  Administered 2019-10-16: 0.4 mg via INTRAVENOUS
  Filled 2019-10-16: qty 5

## 2019-10-16 MED ORDER — SODIUM CHLORIDE FLUSH 0.9 % IV SOLN
INTRAVENOUS | Status: AC
  Administered 2019-10-16: 10 mL via INTRAVENOUS
  Filled 2019-10-16: qty 10

## 2019-10-16 MED ORDER — TECHNETIUM TC 99M TETROFOSMIN IV KIT
30.0000 | PACK | Freq: Once | INTRAVENOUS | Status: AC | PRN
  Administered 2019-10-16: 30.8 via INTRAVENOUS

## 2019-10-17 DIAGNOSIS — R278 Other lack of coordination: Secondary | ICD-10-CM | POA: Diagnosis not present

## 2019-10-17 DIAGNOSIS — G8194 Hemiplegia, unspecified affecting left nondominant side: Secondary | ICD-10-CM | POA: Diagnosis not present

## 2019-10-19 DIAGNOSIS — R2681 Unsteadiness on feet: Secondary | ICD-10-CM | POA: Diagnosis not present

## 2019-10-19 DIAGNOSIS — M6281 Muscle weakness (generalized): Secondary | ICD-10-CM | POA: Diagnosis not present

## 2019-10-20 DIAGNOSIS — G8194 Hemiplegia, unspecified affecting left nondominant side: Secondary | ICD-10-CM | POA: Diagnosis not present

## 2019-10-20 DIAGNOSIS — R278 Other lack of coordination: Secondary | ICD-10-CM | POA: Diagnosis not present

## 2019-10-21 ENCOUNTER — Telehealth: Payer: Self-pay | Admitting: *Deleted

## 2019-10-21 DIAGNOSIS — D649 Anemia, unspecified: Secondary | ICD-10-CM | POA: Diagnosis not present

## 2019-10-21 DIAGNOSIS — Z0001 Encounter for general adult medical examination with abnormal findings: Secondary | ICD-10-CM | POA: Diagnosis not present

## 2019-10-21 DIAGNOSIS — U071 COVID-19: Secondary | ICD-10-CM | POA: Diagnosis not present

## 2019-10-21 DIAGNOSIS — E1165 Type 2 diabetes mellitus with hyperglycemia: Secondary | ICD-10-CM | POA: Diagnosis not present

## 2019-10-21 DIAGNOSIS — M545 Low back pain: Secondary | ICD-10-CM | POA: Diagnosis not present

## 2019-10-21 DIAGNOSIS — I10 Essential (primary) hypertension: Secondary | ICD-10-CM | POA: Diagnosis not present

## 2019-10-21 DIAGNOSIS — G8190 Hemiplegia, unspecified affecting unspecified side: Secondary | ICD-10-CM | POA: Diagnosis not present

## 2019-10-21 DIAGNOSIS — M6281 Muscle weakness (generalized): Secondary | ICD-10-CM | POA: Diagnosis not present

## 2019-10-21 DIAGNOSIS — Z0000 Encounter for general adult medical examination without abnormal findings: Secondary | ICD-10-CM | POA: Diagnosis not present

## 2019-10-21 DIAGNOSIS — R2681 Unsteadiness on feet: Secondary | ICD-10-CM | POA: Diagnosis not present

## 2019-10-21 DIAGNOSIS — Z20828 Contact with and (suspected) exposure to other viral communicable diseases: Secondary | ICD-10-CM | POA: Diagnosis not present

## 2019-10-21 NOTE — Telephone Encounter (Signed)
Laurine Blazer, LPN  3/70/2301 7:20 PM EDT Back to Top    [name] w/ [facility] facility notified. Copy to pcp.

## 2019-10-21 NOTE — Telephone Encounter (Signed)
-----   Message from Verta Ellen., NP sent at 10/19/2019  4:33 PM EDT ----- Please call the patient and let her know the stress test did not show any lack of blood flow through the heart. It was considered a low risk study. No reason to suspect lack of blood flow through the heart to be the cause of her chest pain. Thanks

## 2019-10-23 DIAGNOSIS — R278 Other lack of coordination: Secondary | ICD-10-CM | POA: Diagnosis not present

## 2019-10-23 DIAGNOSIS — E1165 Type 2 diabetes mellitus with hyperglycemia: Secondary | ICD-10-CM | POA: Diagnosis not present

## 2019-10-23 DIAGNOSIS — S01311A Laceration without foreign body of right ear, initial encounter: Secondary | ICD-10-CM | POA: Diagnosis not present

## 2019-10-23 DIAGNOSIS — G8190 Hemiplegia, unspecified affecting unspecified side: Secondary | ICD-10-CM | POA: Diagnosis not present

## 2019-10-23 DIAGNOSIS — G8194 Hemiplegia, unspecified affecting left nondominant side: Secondary | ICD-10-CM | POA: Diagnosis not present

## 2019-10-23 DIAGNOSIS — I10 Essential (primary) hypertension: Secondary | ICD-10-CM | POA: Diagnosis not present

## 2019-10-23 DIAGNOSIS — U071 COVID-19: Secondary | ICD-10-CM | POA: Diagnosis not present

## 2019-10-23 DIAGNOSIS — Z20828 Contact with and (suspected) exposure to other viral communicable diseases: Secondary | ICD-10-CM | POA: Diagnosis not present

## 2019-10-23 DIAGNOSIS — F333 Major depressive disorder, recurrent, severe with psychotic symptoms: Secondary | ICD-10-CM | POA: Diagnosis not present

## 2019-10-24 DIAGNOSIS — Z23 Encounter for immunization: Secondary | ICD-10-CM | POA: Diagnosis not present

## 2019-10-27 DIAGNOSIS — G8194 Hemiplegia, unspecified affecting left nondominant side: Secondary | ICD-10-CM | POA: Diagnosis not present

## 2019-10-27 DIAGNOSIS — R278 Other lack of coordination: Secondary | ICD-10-CM | POA: Diagnosis not present

## 2019-10-28 DIAGNOSIS — M6281 Muscle weakness (generalized): Secondary | ICD-10-CM | POA: Diagnosis not present

## 2019-10-28 DIAGNOSIS — Z20828 Contact with and (suspected) exposure to other viral communicable diseases: Secondary | ICD-10-CM | POA: Diagnosis not present

## 2019-10-28 DIAGNOSIS — R2681 Unsteadiness on feet: Secondary | ICD-10-CM | POA: Diagnosis not present

## 2019-10-28 DIAGNOSIS — U071 COVID-19: Secondary | ICD-10-CM | POA: Diagnosis not present

## 2019-10-28 DIAGNOSIS — M545 Low back pain, unspecified: Secondary | ICD-10-CM | POA: Diagnosis not present

## 2019-10-30 DIAGNOSIS — R2681 Unsteadiness on feet: Secondary | ICD-10-CM | POA: Diagnosis not present

## 2019-10-30 DIAGNOSIS — M6281 Muscle weakness (generalized): Secondary | ICD-10-CM | POA: Diagnosis not present

## 2019-10-30 DIAGNOSIS — U071 COVID-19: Secondary | ICD-10-CM | POA: Diagnosis not present

## 2019-10-30 DIAGNOSIS — Z20828 Contact with and (suspected) exposure to other viral communicable diseases: Secondary | ICD-10-CM | POA: Diagnosis not present

## 2019-10-30 DIAGNOSIS — M545 Low back pain, unspecified: Secondary | ICD-10-CM | POA: Diagnosis not present

## 2019-10-31 DIAGNOSIS — G8194 Hemiplegia, unspecified affecting left nondominant side: Secondary | ICD-10-CM | POA: Diagnosis not present

## 2019-10-31 DIAGNOSIS — R278 Other lack of coordination: Secondary | ICD-10-CM | POA: Diagnosis not present

## 2019-11-04 DIAGNOSIS — U071 COVID-19: Secondary | ICD-10-CM | POA: Diagnosis not present

## 2019-11-04 DIAGNOSIS — Z20828 Contact with and (suspected) exposure to other viral communicable diseases: Secondary | ICD-10-CM | POA: Diagnosis not present

## 2019-11-05 ENCOUNTER — Ambulatory Visit (INDEPENDENT_AMBULATORY_CARE_PROVIDER_SITE_OTHER): Payer: Medicare Other | Admitting: *Deleted

## 2019-11-05 DIAGNOSIS — Z8679 Personal history of other diseases of the circulatory system: Secondary | ICD-10-CM

## 2019-11-05 DIAGNOSIS — I4891 Unspecified atrial fibrillation: Secondary | ICD-10-CM

## 2019-11-05 DIAGNOSIS — Z5181 Encounter for therapeutic drug level monitoring: Secondary | ICD-10-CM

## 2019-11-05 LAB — POCT INR: INR: 2.9 (ref 2.0–3.0)

## 2019-11-05 NOTE — Patient Instructions (Signed)
Continue warfarin 1 tablet daily except 1/2 tablet on Mondays  Recheck in 4 weeks Orders written for [facility] [phone]

## 2019-11-06 ENCOUNTER — Ambulatory Visit (INDEPENDENT_AMBULATORY_CARE_PROVIDER_SITE_OTHER): Payer: Medicare Other | Admitting: "Endocrinology

## 2019-11-06 ENCOUNTER — Other Ambulatory Visit: Payer: Self-pay

## 2019-11-06 ENCOUNTER — Encounter: Payer: Self-pay | Admitting: "Endocrinology

## 2019-11-06 VITALS — BP 86/50 | HR 88 | Ht 65.0 in | Wt 125.0 lb

## 2019-11-06 DIAGNOSIS — E1159 Type 2 diabetes mellitus with other circulatory complications: Secondary | ICD-10-CM

## 2019-11-06 DIAGNOSIS — U071 COVID-19: Secondary | ICD-10-CM | POA: Diagnosis not present

## 2019-11-06 DIAGNOSIS — I959 Hypotension, unspecified: Secondary | ICD-10-CM | POA: Diagnosis not present

## 2019-11-06 DIAGNOSIS — E782 Mixed hyperlipidemia: Secondary | ICD-10-CM | POA: Diagnosis not present

## 2019-11-06 DIAGNOSIS — Z20828 Contact with and (suspected) exposure to other viral communicable diseases: Secondary | ICD-10-CM | POA: Diagnosis not present

## 2019-11-06 MED ORDER — LINAGLIPTIN 5 MG PO TABS: 5.0000 mg | ORAL_TABLET | Freq: Every day | ORAL | 3 refills | Status: DC

## 2019-11-06 MED ORDER — LEVEMIR FLEXTOUCH 100 UNIT/ML ~~LOC~~ SOPN
38.0000 [IU] | PEN_INJECTOR | Freq: Every day | SUBCUTANEOUS | 3 refills | Status: DC
Start: 2019-11-06 — End: 2020-04-26

## 2019-11-06 MED ORDER — MIDODRINE HCL 10 MG PO TABS
10.0000 mg | ORAL_TABLET | Freq: Two times a day (BID) | ORAL | 3 refills | Status: DC
Start: 2019-11-06 — End: 2019-11-11

## 2019-11-06 NOTE — Progress Notes (Signed)
11/06/2019  Endocrinology follow-up note  -She was assisted by her caregiver,  at her nursing home.     Subjective:    Patient ID: [name], female    DOB: Mar 10, 1945,    Past Medical History:  Diagnosis Date  . Anxiety   . Arthritis   . COVID-19 virus infection    COVID-19+ approx 01/24/19; asymptomatic course with full recovery  . Dependence on wheelchair    pivot/transfers  . Depression    History of psychosis and previous suicide attempt  . DVT, lower extremity, recurrent (HCC)    Long-term Coumadin per Dr. Legrand Rams  . Essential hypertension   . GERD (gastroesophageal reflux disease)   . Hemiplegia ([place]) 2010   Left side  . History of stroke    Acute infarct and right cerebral white matter small vessel disease 12/10  . Leg DVT (deep venous thromboembolism), acute ([place]) 2006  . Schizophrenia ([place])   . Stroke [facility])    left sided weakness  . Type 2 diabetes mellitus ([place])    Past Surgical History:  Procedure Laterality Date  . BACK SURGERY    . BIOPSY N/A 11/24/2014   Procedure: BIOPSY;  Surgeon: Danie Binder, MD;  Location: [facility];  Service: Endoscopy;  Laterality: N/A;  . BIOPSY  05/17/2016   Procedure: BIOPSY;  Surgeon: Danie Binder, MD;  Location: [facility];  Service: Endoscopy;;  gastric biopsy  . COLONOSCOPY WITH PROPOFOL N/A 11/24/2014   Dr. Rudie Meyer polyps removed/moderate sized internal hemorrhoids, tubular adenomas. Next surveillance in 3 years  . ESOPHAGOGASTRODUODENOSCOPY (EGD) WITH PROPOFOL N/A 11/24/2014   Dr. Clayburn Pert HH/patent stricture at the gastroesophageal junction, mild non-erosive gastritis, path negative for H.pylori or celiac sprue  . ESOPHAGOGASTRODUODENOSCOPY (EGD) WITH PROPOFOL N/A 05/17/2016   Procedure: ESOPHAGOGASTRODUODENOSCOPY (EGD) WITH PROPOFOL;  Surgeon: Danie Binder, MD;  Location: [facility];  Service: Endoscopy;  Laterality: N/A;  12:45pm  . GIVENS CAPSULE STUDY N/A 12/11/2014   MULTILPLE EROSION IN the stomach WITH  ACTIVE OOZING. OCCASIONAL EROSIONS AND RARE ULCER SEEN IN PROXIMAL SMALL BOWEL . No masses or AVMs SEEN. NO OLD BLOOD OR FRESH BLOOD SEEN.   . POLYPECTOMY N/A 11/24/2014   Procedure: POLYPECTOMY;  Surgeon: Danie Binder, MD;  Location: [facility];  Service: Endoscopy;  Laterality: N/A;  . SAVORY DILATION N/A 05/17/2016   Procedure: SAVORY DILATION;  Surgeon: Danie Binder, MD;  Location: [facility];  Service: Endoscopy;  Laterality: N/A;   Family History  Problem Relation Age of Onset  . Hypertension Mother   . Colon cancer Neg Hx     Social History   Socioeconomic History  . Marital status: Widowed    Spouse name: Not on file  . Number of children: Not on file  . Years of education: Not on file  . Highest education level: Not on file  Occupational History  . Not on file  Tobacco Use  . Smoking status: Former Smoker    Packs/day: 0.25    Years: 20.00    Pack years: 5.00    Types: Cigarettes    Quit date: 01/24/1995    Years since quitting: 24.8  . Smokeless tobacco: Never Used  Vaping Use  . Vaping Use: Never used  Substance and Sexual Activity  . Alcohol use: No    Alcohol/week: 0.0 standard drinks  . Drug use: No  . Sexual activity: Never    Birth control/protection: Post-menopausal  Other Topics Concern  . Not on  file  Social History Narrative  . Not on file   Social Determinants of Health   Financial Resource Strain:   . Difficulty of Paying Living Expenses: Not on file  Food Insecurity:   . Worried About Charity fundraiser in the Last Year: Not on file  . Ran Out of Food in the Last Year: Not on file  Transportation Needs:   . Lack of Transportation (Medical): Not on file  . Lack of Transportation (Non-Medical): Not on file  Physical Activity:   . Days of Exercise per Week: Not on file  . Minutes of Exercise per Session: Not on file  Stress:   . Feeling of Stress : Not on file  Social Connections:   . Frequency of Communication with Friends and Family:  Not on file  . Frequency of Social Gatherings with Friends and Family: Not on file  . Attends Religious Services: Not on file  . Active Member of Clubs or Organizations: Not on file  . Attends Archivist Meetings: Not on file  . Marital Status: Not on file   Outpatient Encounter Medications as of 11/06/2019  Medication Sig  . ABILIFY 2 MG tablet Take 1 tablet (2 mg total) by mouth daily.  Marland Kitchen acetaminophen (TYLENOL) 325 MG tablet Take 650 mg by mouth 3 (three) times daily as needed for mild pain or moderate pain.   . calcium carbonate (TUMS - DOSED IN MG ELEMENTAL CALCIUM) 500 MG chewable tablet Chew 1 tablet by mouth 4 (four) times daily as needed for indigestion or heartburn.   . Carboxymethylcellulose Sodium (ARTIFICIAL TEARS OP) Place 1 drop into both eyes 3 (three) times daily.  . Cholecalciferol (VITAMIN D3) 50 MCG (2000 UT) TABS Take 1 tablet by mouth daily.  . clonazePAM (KLONOPIN) 0.5 MG tablet Take 1 tablet (0.5 mg total) by mouth 3 (three) times daily.  Marland Kitchen diltiazem (CARDIZEM CD) 120 MG 24 hr capsule Take 1 capsule (120 mg total) by mouth daily.  Marland Kitchen docusate sodium (COLACE) 100 MG capsule Take 200 mg by mouth at bedtime.   . DULoxetine (CYMBALTA) 60 MG capsule Take 120 mg by mouth daily.   . fluticasone (FLONASE) 50 MCG/ACT nasal spray Place 2 sprays into both nostrils daily as needed for allergies or rhinitis.  Marland Kitchen gabapentin (NEURONTIN) 300 MG capsule Take 300 mg by mouth 3 (three) times daily.  Marland Kitchen HYDROcodone-acetaminophen (NORCO) 10-325 MG tablet Take 1 tablet by mouth in the morning, at noon, and at bedtime.   . hydrocortisone (ANUSOL-HC) 2.5 % rectal cream Place 1 application rectally 2 (two) times daily as needed.   . insulin detemir (LEVEMIR FLEXTOUCH) 100 UNIT/ML FlexPen Inject 38 Units into the skin at bedtime.  . iron polysaccharides (NIFEREX) 150 MG capsule Take 1 capsule (150 mg total) by mouth daily.  Marland Kitchen latanoprost (XALATAN) 0.005 % ophthalmic solution Place 1 drop  into both eyes at bedtime.  Marland Kitchen linagliptin (TRADJENTA) 5 MG TABS tablet Take 1 tablet (5 mg total) by mouth daily.  Marland Kitchen loratadine (CLARITIN) 10 MG tablet Take 10 mg by mouth daily.  Marland Kitchen lubiprostone (AMITIZA) 24 MCG capsule Take 1 capsule (24 mcg total) by mouth 2 (two) times daily with a meal.  . metoprolol succinate (TOPROL-XL) 25 MG 24 hr tablet Take 1 tablet (25 mg total) by mouth daily.  . midodrine (PROAMATINE) 10 MG tablet Take 1 tablet (10 mg total) by mouth 2 (two) times daily with a meal.  . mirabegron ER (MYRBETRIQ) 25 MG TB24  tablet Take 25 mg by mouth daily.  . mirtazapine (REMERON) 30 MG tablet Take 30 mg by mouth at bedtime.  . ondansetron (ZOFRAN) 4 MG tablet Take 1 tablet (4 mg total) by mouth 4 (four) times daily -  before meals and at bedtime. (Patient taking differently: Take 4 mg by mouth 2 (two) times daily as needed for nausea or vomiting. )  . pantoprazole (PROTONIX) 40 MG tablet Take 1 tablet (40 mg total) by mouth 2 (two) times daily before a meal.  . phenol (CHLORASEPTIC) 1.4 % LIQD Use as directed 1 spray in the mouth or throat every 6 (six) hours as needed for throat irritation / pain.  . polyethylene glycol (MIRALAX / GLYCOLAX) 17 g packet Take 17 g by mouth 2 (two) times daily as needed for mild constipation or moderate constipation.  . risperiDONE (RISPERDAL) 1 MG tablet Take 1 tablet (1 mg total) by mouth in the morning and at bedtime.  . simvastatin (ZOCOR) 20 MG tablet Take 20 mg by mouth at bedtime.   Marland Kitchen tiZANidine (ZANAFLEX) 2 MG tablet Take 2 mg by mouth daily.  Marland Kitchen warfarin (COUMADIN) 5 MG tablet Take 1 tablet (5 mg total) by mouth every evening.  . zolpidem (AMBIEN) 10 MG tablet Take 10 mg by mouth at bedtime.  . [DISCONTINUED] LEVEMIR FLEXTOUCH 100 UNIT/ML Pen INJECT 28 UNITS INTO THE SKIN AT BEDTIME.  . [DISCONTINUED] midodrine (PROAMATINE) 5 MG tablet Take 1 tablet (5 mg total) by mouth 2 (two) times daily.   No facility-administered encounter medications on  file as of 11/06/2019.   ALLERGIES: Allergies  Allergen Reactions  . Sulfa Antibiotics Rash   VACCINATION STATUS: There is no immunization history for the selected administration types on file for this patient.  Diabetes She presents for her follow-up diabetic visit. She has type 2 diabetes mellitus. Onset time: She was diagnosed at approximate age of 42 years. Her disease course has been worsening. There are no hypoglycemic associated symptoms. Pertinent negatives for hypoglycemia include no confusion, pallor or seizures. Associated symptoms include polydipsia and polyuria. Pertinent negatives for diabetes include no polyphagia. There are no hypoglycemic complications. Symptoms are worsening. Diabetic complications include a CVA. Risk factors for coronary artery disease include dyslipidemia, diabetes mellitus, obesity, sedentary lifestyle and hypertension. Current diabetic treatment includes intensive insulin program and oral agent (monotherapy) (She kept requiring large dose of insulin due to consumption of large quantities of processed carbohydrates.Marland Kitchen). Her weight is stable. She is following a generally unhealthy diet. When asked about meal planning, she reported none. She never participates in exercise. Her home blood glucose trend is increasing steadily. Her breakfast blood glucose range is generally 180-200 mg/dl. Her bedtime blood glucose range is generally >200 mg/dl. Her overall blood glucose range is >200 mg/dl. (She returns with her nurse to clinic with loss of control , a1c of 10.6% increasing from 7.2%.) An ACE inhibitor/angiotensin II receptor blocker is being taken.  Hypertension This is a chronic problem. The current episode started more than 1 year ago. The problem is uncontrolled (Was found to have hypotension of 77/48, repeat 113/67.). Pertinent negatives include no palpitations or shortness of breath. Risk factors for coronary artery disease include dyslipidemia and diabetes  mellitus. Past treatments include beta blockers (She is currently on a low-dose metoprolol.). Hypertensive end-organ damage includes CVA.  Hyperlipidemia This is a chronic problem. The current episode started more than 1 year ago. Exacerbating diseases include diabetes. Pertinent negatives include no shortness of breath. Risk factors for  coronary artery disease include diabetes mellitus, dyslipidemia, hypertension, obesity and a sedentary lifestyle.    Review of systems: Limited as above.  Objective:    BP (!) 86/50   Pulse 88   Ht 5\' 5"  (1.651 m)   Wt 125 lb (56.7 kg)   BMI 20.80 kg/m   Wt Readings from Last 3 Encounters:  11/06/19 125 lb (56.7 kg)  10/14/19 128 lb (58.1 kg)  10/04/19 117 lb (53.1 kg)     Results for orders placed or performed in visit on 11/05/19  POCT INR  Result Value Ref Range   INR 2.9 2.0 - 3.0   Complete Blood Count (Most recent): Lab Results  Component Value Date   WBC 6.9 10/05/2019   HGB 13.2 10/05/2019   HCT 39.8 10/05/2019   MCV 91.1 10/05/2019   PLT 231 10/05/2019   Lab Results  Component Value Date   NA 137 10/05/2019   K 3.8 10/05/2019   CL 104 10/05/2019   CO2 28 10/05/2019   BUN 13 10/05/2019   CREATININE 0.61 10/05/2019   Diabetic Labs (most recent): Lab Results  Component Value Date   HGBA1C 10.6 (H) 10/05/2019   HGBA1C 10.5 (H) 10/04/2019   HGBA1C 7.2 (H) 06/27/2018   Lipid Panel     Component Value Date/Time   CHOL 151 10/05/2019 0457   TRIG 149 10/05/2019 0457   HDL 40 (L) 10/05/2019 0457   CHOLHDL 3.8 10/05/2019 0457   VLDL 30 10/05/2019 0457   LDLCALC 81 10/05/2019 0457     Assessment & Plan:   1. Type 2 diabetes mellitus with vascular disease ([place]) Her diabetes is  complicated by recurrent CVA. She missed her appointments since Oct. 2020, returns with loss of control of diabetes with a1c of 10.6% increasing from 7.2%.    - Patient remains at a high risk for more acute and chronic complications of diabetes  which include CAD, CVA, CKD, retinopathy, and neuropathy. These are all discussed in detail with the patient.  - I have re-counseled the patient on diet management and weight loss  by adopting a carbohydrate restricted / protein rich  Diet.  - Patient is advised to stick to a routine mealtimes to eat 3 meals  a day and avoid unnecessary snacks ( to snack only to correct hypoglycemia).  - I have approached patient with the following individualized plan to manage diabetes and patient agrees.   -Due to her loss of control , she may need resumption of  NovoLog she used before. -In the mean time, she is advised to increase her Levemir to 38 units nightly, start  to monitor blood glucose 4 times a day-daily before meals and at bedtime.    -Patient is encouraged to call clinic for blood glucose levels less than 70 or above 300 mg /dl. -Added Tradjenta 5mg  po qam.  - she is not a suitable candidate for metformin nor SGLT2 inhibitor therapy.   - Patient specific target  for A1c; LDL, HDL, Triglycerides, and  Waist Circumference were discussed in detail.  2) BP/HTN: She was found to have hypertension at 86/50.   She is only on a low-dose metoprolol.  I discussed and increase her midodrine to 10 mg p.o. twice daily.    3) Lipids/HPL: Her recent lipid panel from September 2018 showed LDL of 55.  She is advised to continue simvastatin 20 mg p.o. nightly.      4) Chronic Care/Health Maintenance:  -Continue to need one-on-one assistance at  mealtimes as well as diligent hydration.  Patient needs to keep her appointment with Ophthalmology, Podiatrist at least yearly or according to recommendations, and advised to  stay away from smoking. I have recommended yearly flu vaccine and pneumonia vaccination at least every 5 years; and  sleep for at least 7 hours a day.  I advised patient to maintain close follow up with her PCP for primary care needs.  - Time spent on this patient care encounter:  35 min, of  which > 50% was spent in  counseling and the rest reviewing her blood glucose logs , discussing her hypoglycemia and hyperglycemia episodes, reviewing her current and  previous labs / studies  ( including abstraction from other facilities) and medications  doses and developing a  long term treatment plan and documenting her care.   Please refer to Patient Instructions for Blood Glucose Monitoring and Insulin/Medications Dosing Guide"  in media tab for additional information. Please  also refer to " Patient Self Inventory" in the Media  tab for reviewed elements of pertinent patient history.  [name] participated in the discussions, expressed understanding, and voiced agreement with the above plans.  All questions were answered to her satisfaction. she is encouraged to contact clinic should she have any questions or concerns prior to her return visit.    Follow up plan: Return in about 2 weeks (around 11/20/2019) for F/U with Meter and Logs Only - no Labs.  Glade Lloyd, MD Phone: [phone]  Fax: [phone]   This note was partially dictated with voice recognition software. Similar sounding words can be transcribed inadequately or may not  be corrected upon review.  11/06/2019, 12:54 PM

## 2019-11-10 DIAGNOSIS — F419 Anxiety disorder, unspecified: Secondary | ICD-10-CM | POA: Diagnosis not present

## 2019-11-10 DIAGNOSIS — G894 Chronic pain syndrome: Secondary | ICD-10-CM | POA: Diagnosis not present

## 2019-11-10 DIAGNOSIS — G2401 Drug induced subacute dyskinesia: Secondary | ICD-10-CM | POA: Diagnosis not present

## 2019-11-10 DIAGNOSIS — I69359 Hemiplegia and hemiparesis following cerebral infarction affecting unspecified side: Secondary | ICD-10-CM | POA: Diagnosis not present

## 2019-11-10 DIAGNOSIS — Z79891 Long term (current) use of opiate analgesic: Secondary | ICD-10-CM | POA: Diagnosis not present

## 2019-11-10 DIAGNOSIS — M5000 Cervical disc disorder with myelopathy, unspecified cervical region: Secondary | ICD-10-CM | POA: Diagnosis not present

## 2019-11-10 DIAGNOSIS — M5459 Other low back pain: Secondary | ICD-10-CM | POA: Diagnosis not present

## 2019-11-10 NOTE — Progress Notes (Signed)
Cardiology Office Note  Date: 11/11/2019   ID: [name], DOB [birth year], MRN [id number]  PCP:  Courtney Fire, MD  Cardiologist:  Rozann Lesches, MD Electrophysiologist:  None   Chief Complaint: History of DVT, CVA, DM 2, hypertension  History of Present Illness: [name] is a 74 y.o. female with a history of CVA, DM 2, HTN, DVT .  Last saw Dr. Domenic Polite on 10/23/2018 via telemedicine visit.  History of DVT and CVA on chronic Coumadin under the direction of Dr. Henderson Cloud.  Is followed in anticoagulation clinic.  No reported bleeding episodes.  History of SVT no recent episodes she was on low-dose Toprol-XL.  Continuing on midodrine for orthostatic hypotension.  Recent admission on 10/04/2019 for atrial fibrillation with RVR.  She presented with chest discomfort that started earlier in the day.  On presentation to ER she is found to be tachycardic in the 170s.  She was given dose of adenosine which slowed her heart rate.  It became evident that she was in atrial fibrillation.  She began to be tachycardic again in the 130s to 140s.  She was a placed on a Cardizem drip.  She converted back to normal sinus rhythm after being started on IV Cardizem.  CHA2DS2-VASc score was 6, equal to over 9.7% annual risk of stroke.  Given new onset atrial fibrillation repeat echo was advised as an outpatient.  History of HFpEF chronic diastolic CHF.  Echo from September 2018 with EF 65 to 70%, grade 1 DD.  History of prior DVT and CVA on chronic anticoagulation with INR currently subtherapeutic at 1.3.  She received Lovenox while in the hospital.  Coumadin was adjusted to 5 mg daily.  Repeat PT/INR and CBC advised for Tuesday, 10/07/2019.  Orthostatic hypotension.  Midodrine was increased to 5 mg 3 times daily due to being on increased dose of metoprolol for rate control.  Recent A1c was 7.2% reflecting uncontrolled diabetes with hyperglycemia prior to admission.  She presents today for hospital follow-up  with continued complaints of constant chest pain with no aggravating or alleviating factors other than she states her left chest is a little sore when she presses on it.  Heart rate is 105 and irregularly irregular on arrival.  States she feels short of breath with and without exertion.  Recent echocardiogram on 9     Past Medical History:  Diagnosis Date  . Anxiety   . Arthritis   . COVID-19 virus infection    COVID-19+ approx 01/24/19; asymptomatic course with full recovery  . Dependence on wheelchair    pivot/transfers  . Depression    History of psychosis and previous suicide attempt  . DVT, lower extremity, recurrent (HCC)    Long-term Coumadin per Dr. Legrand Rams  . Essential hypertension   . GERD (gastroesophageal reflux disease)   . Hemiplegia ([place]) 2010   Left side  . History of stroke    Acute infarct and right cerebral white matter small vessel disease 12/10  . Leg DVT (deep venous thromboembolism), acute ([place]) 2006  . Schizophrenia ([place])   . Stroke [facility])    left sided weakness  . Type 2 diabetes mellitus ([place])     Past Surgical History:  Procedure Laterality Date  . BACK SURGERY    . BIOPSY N/A 11/24/2014   Procedure: BIOPSY;  Surgeon: Danie Binder, MD;  Location: [facility] ORS;  Service: Endoscopy;  Laterality: N/A;  . BIOPSY  05/17/2016   Procedure: BIOPSY;  Surgeon: Marga Melnick  Fields, MD;  Location: [facility] ENDO SUITE;  Service: Endoscopy;;  gastric biopsy  . COLONOSCOPY WITH PROPOFOL N/A 11/24/2014   Dr. Rudie Meyer polyps removed/moderate sized internal hemorrhoids, tubular adenomas. Next surveillance in 3 years  . ESOPHAGOGASTRODUODENOSCOPY (EGD) WITH PROPOFOL N/A 11/24/2014   Dr. Clayburn Pert HH/patent stricture at the gastroesophageal junction, mild non-erosive gastritis, path negative for H.pylori or celiac sprue  . ESOPHAGOGASTRODUODENOSCOPY (EGD) WITH PROPOFOL N/A 05/17/2016   Procedure: ESOPHAGOGASTRODUODENOSCOPY (EGD) WITH PROPOFOL;  Surgeon: Danie Binder, MD;  Location: [facility]  ENDO SUITE;  Service: Endoscopy;  Laterality: N/A;  12:45pm  . GIVENS CAPSULE STUDY N/A 12/11/2014   MULTILPLE EROSION IN the stomach WITH ACTIVE OOZING. OCCASIONAL EROSIONS AND RARE ULCER SEEN IN PROXIMAL SMALL BOWEL . No masses or AVMs SEEN. NO OLD BLOOD OR FRESH BLOOD SEEN.   . POLYPECTOMY N/A 11/24/2014   Procedure: POLYPECTOMY;  Surgeon: Danie Binder, MD;  Location: [facility];  Service: Endoscopy;  Laterality: N/A;  . SAVORY DILATION N/A 05/17/2016   Procedure: SAVORY DILATION;  Surgeon: Danie Binder, MD;  Location: [facility] END[facility];  Service: Endoscopy;  Laterality: N/A;    Current Outpatient Medications  Medication Sig Dispense Refill  . ABILIFY 2 MG tablet Take 1 tablet (2 mg total) by mouth daily. 28 tablet 2  . acetaminophen (TYLENOL) 325 MG tablet Take 650 mg by mouth 3 (three) times daily as needed for mild pain or moderate pain.     . calcium carbonate (TUMS - DOSED IN MG ELEMENTAL CALCIUM) 500 MG chewable tablet Chew 1 tablet by mouth 4 (four) times daily as needed for indigestion or heartburn.     . Carboxymethylcellulose Sodium (ARTIFICIAL TEARS OP) Place 1 drop into both eyes 3 (three) times daily.    . Cholecalciferol (VITAMIN D3) 50 MCG (2000 UT) TABS Take 1 tablet by mouth daily.    . clonazePAM (KLONOPIN) 0.5 MG tablet Take 1 tablet (0.5 mg total) by mouth 3 (three) times daily. 90 tablet 2  . diltiazem (CARDIZEM CD) 120 MG 24 hr capsule Take 1 capsule (120 mg total) by mouth daily. 30 capsule 6  . docusate sodium (COLACE) 100 MG capsule Take 200 mg by mouth at bedtime.     Marland Kitchen doxycycline (VIBRAMYCIN) 100 MG capsule Take 100 mg by mouth 2 (two) times daily.    . DULoxetine (CYMBALTA) 60 MG capsule Take 120 mg by mouth daily.     . fluticasone (FLONASE) 50 MCG/ACT nasal spray Place 2 sprays into both nostrils daily as needed for allergies or rhinitis.    Marland Kitchen gabapentin (NEURONTIN) 300 MG capsule Take 300 mg by mouth 3 (three) times daily.    Marland Kitchen HYDROcodone-acetaminophen (NORCO)  10-325 MG tablet Take 1 tablet by mouth in the morning, at noon, and at bedtime.     . hydrocortisone (ANUSOL-HC) 2.5 % rectal cream Place 1 application rectally 2 (two) times daily as needed.     . insulin detemir (LEVEMIR FLEXTOUCH) 100 UNIT/ML FlexPen Inject 38 Units into the skin at bedtime. 15 mL 3  . iron polysaccharides (NIFEREX) 150 MG capsule Take 1 capsule (150 mg total) by mouth daily. 30 capsule 11  . latanoprost (XALATAN) 0.005 % ophthalmic solution Place 1 drop into both eyes at bedtime.    Marland Kitchen linagliptin (TRADJENTA) 5 MG TABS tablet Take 1 tablet (5 mg total) by mouth daily. 30 tablet 3  . loratadine (CLARITIN) 10 MG tablet Take 10 mg by mouth daily.    Marland Kitchen lubiprostone (AMITIZA) 24 MCG  capsule Take 1 capsule (24 mcg total) by mouth 2 (two) times daily with a meal. 60 capsule 5  . metoprolol succinate (TOPROL-XL) 25 MG 24 hr tablet Take 1 tablet (25 mg total) by mouth daily. 30 tablet 6  . midodrine (PROAMATINE) 10 MG tablet Take 1 tablet (10 mg total) by mouth 2 (two) times daily with a meal. 60 tablet 3  . mirabegron ER (MYRBETRIQ) 25 MG TB24 tablet Take 25 mg by mouth daily.    . mirtazapine (REMERON) 30 MG tablet Take 30 mg by mouth at bedtime.    . ondansetron (ZOFRAN) 4 MG tablet Take 1 tablet (4 mg total) by mouth 4 (four) times daily -  before meals and at bedtime. (Patient taking differently: Take 4 mg by mouth 2 (two) times daily as needed for nausea or vomiting. ) 120 tablet 3  . pantoprazole (PROTONIX) 40 MG tablet Take 1 tablet (40 mg total) by mouth 2 (two) times daily before a meal. 270 tablet 3  . phenol (CHLORASEPTIC) 1.4 % LIQD Use as directed 1 spray in the mouth or throat every 6 (six) hours as needed for throat irritation / pain.    . polyethylene glycol (MIRALAX / GLYCOLAX) 17 g packet Take 17 g by mouth 2 (two) times daily as needed for mild constipation or moderate constipation.    . risperiDONE (RISPERDAL) 1 MG tablet Take 1 tablet (1 mg total) by mouth in the  morning and at bedtime. 56 tablet 2  . simvastatin (ZOCOR) 20 MG tablet Take 20 mg by mouth at bedtime.     Marland Kitchen tiZANidine (ZANAFLEX) 2 MG tablet Take 2 mg by mouth daily.    Marland Kitchen warfarin (COUMADIN) 5 MG tablet Take 1 tablet (5 mg total) by mouth every evening. 30 tablet 6  . zolpidem (AMBIEN) 10 MG tablet Take 10 mg by mouth at bedtime.     No current facility-administered medications for this visit.   Allergies:  Sulfa antibiotics   Social History: The patient  reports that she quit smoking about 24 years ago. Her smoking use included cigarettes. She has a 5.00 pack-year smoking history. She has never used smokeless tobacco. She reports that she does not drink alcohol and does not use drugs.   Family History: The patient's family history includes Hypertension in her mother.   ROS:  Please see the history of present illness. Otherwise, complete review of systems is positive for none.  All other systems are reviewed and negative.   Physical Exam: VS:  BP (!) 88/52   Pulse 63   Ht 5\' 5"  (1.651 m)   Wt 128 lb 3.2 oz (58.2 kg)   SpO2 96%   BMI 21.33 kg/m , BMI Body mass index is 21.33 kg/m.  Wt Readings from Last 3 Encounters:  11/11/19 128 lb 3.2 oz (58.2 kg)  11/06/19 125 lb (56.7 kg)  10/14/19 128 lb (58.1 kg)    General: Patient appears comfortable at rest. Neck: Supple, no elevated JVP or carotid bruits, no thyromegaly. Lungs: Clear to auscultation, nonlabored breathing at rest. Cardiac: Irregularly irregular  rate and rhythm, no S3 or significant systolic murmur, no pericardial rub. Extremities: No pitting edema, distal pulses 2+. Skin: Warm and dry. Musculoskeletal: No kyphosis. Neuropsychiatric: Alert and oriented x3, affect grossly appropriate.  ECG:  An ECG dated 10/14/2019 was personally reviewed today and demonstrated:  Atrial flutter with variable AV block rate of 129  Recent Labwork: 10/04/2019: ALT 10; AST 11; B Natriuretic Peptide 311.0  10/05/2019: BUN 13;  Creatinine, Ser 0.61; Hemoglobin 13.2; Platelets 231; Potassium 3.8; Sodium 137; TSH 0.578     Component Value Date/Time   CHOL 151 10/05/2019 0457   TRIG 149 10/05/2019 0457   HDL 40 (L) 10/05/2019 0457   CHOLHDL 3.8 10/05/2019 0457   VLDL 30 10/05/2019 0457   LDLCALC 81 10/05/2019 0457    Other Studies Reviewed Today:   NST 10/16/2019 Study Result  Narrative & Impression   There was no ST segment deviation noted during stress. In and out of afib during study,  The study is normal. Inferior defect likely due to subdiaphragmatic attenuation, cannot completely exclude very mild inferior ischemia. Either findign would support low risk.  This is a low risk study.  The left ventricular ejection fraction is normal (55-65%).    Echocardiogram 10/05/2019 IMPRESSIONS  1. Left ventricular ejection fraction, by estimation, is 60 to 65%. The  left ventricle has normal function. The left ventricle has no regional  wall motion abnormalities. There is moderate concentric left ventricular  hypertrophy and severe basal septal  hypertrophy .  2. Right ventricular systolic function is normal. The right ventricular  size is normal. Tricuspid regurgitation signal is inadequate for assessing  PA pressure.  3. Left atrial size was mildly dilated.  4. The mitral valve is normal in structure. Mild mitral valve  regurgitation. No evidence of mitral stenosis. Moderate mitral annular  calcification.  5. The aortic valve is tricuspid. Aortic valve regurgitation is not  visualized. Mild to moderate aortic valve sclerosis/calcification is  present, without any evidence of aortic stenosis.  6. The inferior vena cava is normal in size with greater than 50%  respiratory variability, suggesting right atrial pressure of 3 mmHg.    Echocardiogram 10/06/2016: Study Conclusions  - Left ventricle: The cavity size was normal. Systolic function was vigorous. The estimated ejection fraction was in  the range of 65% to 70%. Wall motion was normal; there were no regional wall motion abnormalities. Doppler parameters are consistent with abnormal left ventricular relaxation (grade 1 diastolic dysfunction). Doppler parameters are consistent with high ventricular filling pressure. Moderate concentric and severe focal basal septal hypertrophy. - Aortic valve: Moderately calcified annulus. Trileaflet. - Mitral valve: Calcified annulus.   Assessment and Plan:   1. Other chest pain Complaining of constant left precordial chest pain.  States she sometimes has nausea.  Denies any other aggravating or alleviating factors.  States she has been taking some Tums.  She does have gastroesophageal reflux disease for which she takes Protonix.  Recent stress test was low risk.  Complaining of some pain in her left breast.  Upon palpation feel a small mass which appears to be enlarged lymph node.  Advised caregiver that patient needs to be seen to have this examined.  Documented status in her progress note.   Dyspnea, unspecified type 10/05/2019 showed EF of 60 to 65%.  Moderate concentric LVH and severe basal septal hypertrophy.  Has a distinct murmur in the right upper sternal border area 2-3 out of 6.  Has some mitral valve regurgitation which is mild.  Aortic valve is tricuspid.  Aortic valve regurgitation not visualized.  Mild to moderate aortic sclerosis/calcification without any evidence of aortic stenosis.    3. Atrial fibrillation with RVR ([place]) Recent hospitalization for rapid atrial fibrillation.  Her Toprol-XL was increased to 25 mg p.o. twice daily.  Heart rate today 63.  Heart rate well controlled today.  Continue Cardizem CD 120 mg.  Continue Toprol  XL to 25 mg daily.  Continue Coumadin 5 mg daily.  4. History of DVT (deep vein thrombosis) No recent DVT or PE-like symptoms.  She is on Coumadin 5 mg daily.  5. History of CVA (cerebrovascular accident) History of CVA but no new  stroke or TIA-like symptoms.  Patient is able to communicate but she does have schizophrenia and has some difficulty expressing but is understandable.  6. History of supraventricular tachycardia Heart rate today is 63.  Continue Cardizem 120 mg daily, continue Toprol-XL 25 mg daily.  No episodes of palpitations   7. Orthostatic hypotension Blood pressure on the low side today 88/52 since decreasing midodrine to twice a day.  Adjust midodrine to 10 mg p.o. 3 times daily   Medication Adjustments/Labs and Tests Ordered: Current medicines are reviewed at length with the patient today.  Concerns regarding medicines are outlined above.   Disposition: Follow-up with Dr. Domenic Polite or APP 6 months  Signed, Levell July, NP 11/11/2019 10:53 AM    [facility], [place], [place] [address] Phone: [phone]; Fax: [phone]

## 2019-11-11 ENCOUNTER — Encounter: Payer: Self-pay | Admitting: Family Medicine

## 2019-11-11 ENCOUNTER — Ambulatory Visit (INDEPENDENT_AMBULATORY_CARE_PROVIDER_SITE_OTHER): Payer: Medicare Other | Admitting: Family Medicine

## 2019-11-11 VITALS — BP 88/52 | HR 63 | Ht 65.0 in | Wt 128.2 lb

## 2019-11-11 DIAGNOSIS — Z86718 Personal history of other venous thrombosis and embolism: Secondary | ICD-10-CM

## 2019-11-11 DIAGNOSIS — R0789 Other chest pain: Secondary | ICD-10-CM

## 2019-11-11 DIAGNOSIS — I4891 Unspecified atrial fibrillation: Secondary | ICD-10-CM | POA: Diagnosis not present

## 2019-11-11 DIAGNOSIS — R0600 Dyspnea, unspecified: Secondary | ICD-10-CM

## 2019-11-11 DIAGNOSIS — I951 Orthostatic hypotension: Secondary | ICD-10-CM | POA: Diagnosis not present

## 2019-11-11 DIAGNOSIS — I471 Supraventricular tachycardia: Secondary | ICD-10-CM

## 2019-11-11 MED ORDER — MIDODRINE HCL 10 MG PO TABS
10.0000 mg | ORAL_TABLET | Freq: Three times a day (TID) | ORAL | 3 refills | Status: DC
Start: 2019-11-11 — End: 2020-02-09

## 2019-11-11 NOTE — Patient Instructions (Addendum)
Your physician wants you to follow-up in: [place] will receive a reminder letter in the mail two months in advance. If you don't receive a letter, please call our office to schedule the follow-up appointment.  Your physician has recommended you make the following change in your medication:   INCREASE MIDODRINE 10 MG THREE TIME DAILY   Thank you for choosing [facility]!!

## 2019-11-13 DIAGNOSIS — U071 COVID-19: Secondary | ICD-10-CM | POA: Diagnosis not present

## 2019-11-13 DIAGNOSIS — Z20828 Contact with and (suspected) exposure to other viral communicable diseases: Secondary | ICD-10-CM | POA: Diagnosis not present

## 2019-11-18 DIAGNOSIS — Z20828 Contact with and (suspected) exposure to other viral communicable diseases: Secondary | ICD-10-CM | POA: Diagnosis not present

## 2019-11-18 DIAGNOSIS — U071 COVID-19: Secondary | ICD-10-CM | POA: Diagnosis not present

## 2019-11-20 ENCOUNTER — Ambulatory Visit (INDEPENDENT_AMBULATORY_CARE_PROVIDER_SITE_OTHER): Payer: Medicare Other | Admitting: "Endocrinology

## 2019-11-20 ENCOUNTER — Other Ambulatory Visit: Payer: Self-pay

## 2019-11-20 ENCOUNTER — Encounter: Payer: Self-pay | Admitting: "Endocrinology

## 2019-11-20 VITALS — BP 94/49 | HR 92 | Ht 65.0 in

## 2019-11-20 DIAGNOSIS — E782 Mixed hyperlipidemia: Secondary | ICD-10-CM

## 2019-11-20 DIAGNOSIS — U071 COVID-19: Secondary | ICD-10-CM | POA: Diagnosis not present

## 2019-11-20 DIAGNOSIS — E1159 Type 2 diabetes mellitus with other circulatory complications: Secondary | ICD-10-CM

## 2019-11-20 DIAGNOSIS — Z20828 Contact with and (suspected) exposure to other viral communicable diseases: Secondary | ICD-10-CM | POA: Diagnosis not present

## 2019-11-20 NOTE — Progress Notes (Signed)
11/20/2019  Endocrinology follow-up note  -She was assisted by her caregiver from nursing home.     Subjective:    Patient ID: [name], female    DOB: 1945-03-24,    Past Medical History:  Diagnosis Date  . Anxiety   . Arthritis   . COVID-19 virus infection    COVID-19+ approx 01/24/19; asymptomatic course with full recovery  . Dependence on wheelchair    pivot/transfers  . Depression    History of psychosis and previous suicide attempt  . DVT, lower extremity, recurrent (HCC)    Long-term Coumadin per Dr. Legrand Rams  . Essential hypertension   . GERD (gastroesophageal reflux disease)   . Hemiplegia ([place]) 2010   Left side  . History of stroke    Acute infarct and right cerebral white matter small vessel disease 12/10  . Leg DVT (deep venous thromboembolism), acute ([place]) 2006  . Schizophrenia ([place])   . Stroke [facility])    left sided weakness  . Type 2 diabetes mellitus ([place])    Past Surgical History:  Procedure Laterality Date  . BACK SURGERY    . BIOPSY N/A 11/24/2014   Procedure: BIOPSY;  Surgeon: Danie Binder, MD;  Location: [facility];  Service: Endoscopy;  Laterality: N/A;  . BIOPSY  05/17/2016   Procedure: BIOPSY;  Surgeon: Danie Binder, MD;  Location: [facility];  Service: Endoscopy;;  gastric biopsy  . COLONOSCOPY WITH PROPOFOL N/A 11/24/2014   Dr. Rudie Meyer polyps removed/moderate sized internal hemorrhoids, tubular adenomas. Next surveillance in 3 years  . ESOPHAGOGASTRODUODENOSCOPY (EGD) WITH PROPOFOL N/A 11/24/2014   Dr. Clayburn Pert HH/patent stricture at the gastroesophageal junction, mild non-erosive gastritis, path negative for H.pylori or celiac sprue  . ESOPHAGOGASTRODUODENOSCOPY (EGD) WITH PROPOFOL N/A 05/17/2016   Procedure: ESOPHAGOGASTRODUODENOSCOPY (EGD) WITH PROPOFOL;  Surgeon: Danie Binder, MD;  Location: [facility];  Service: Endoscopy;  Laterality: N/A;  12:45pm  . GIVENS CAPSULE STUDY N/A 12/11/2014   MULTILPLE EROSION IN the stomach WITH  ACTIVE OOZING. OCCASIONAL EROSIONS AND RARE ULCER SEEN IN PROXIMAL SMALL BOWEL . No masses or AVMs SEEN. NO OLD BLOOD OR FRESH BLOOD SEEN.   . POLYPECTOMY N/A 11/24/2014   Procedure: POLYPECTOMY;  Surgeon: Danie Binder, MD;  Location: [facility];  Service: Endoscopy;  Laterality: N/A;  . SAVORY DILATION N/A 05/17/2016   Procedure: SAVORY DILATION;  Surgeon: Danie Binder, MD;  Location: [facility];  Service: Endoscopy;  Laterality: N/A;   Family History  Problem Relation Age of Onset  . Hypertension Mother   . Colon cancer Neg Hx     Social History   Socioeconomic History  . Marital status: Widowed    Spouse name: Not on file  . Number of children: Not on file  . Years of education: Not on file  . Highest education level: Not on file  Occupational History  . Not on file  Tobacco Use  . Smoking status: Former Smoker    Packs/day: 0.25    Years: 20.00    Pack years: 5.00    Types: Cigarettes    Quit date: 01/24/1995    Years since quitting: 24.8  . Smokeless tobacco: Never Used  Vaping Use  . Vaping Use: Never used  Substance and Sexual Activity  . Alcohol use: No    Alcohol/week: 0.0 standard drinks  . Drug use: No  . Sexual activity: Never    Birth control/protection: Post-menopausal  Other Topics Concern  . Not on file  Social History Narrative  . Not on file   Social Determinants of Health   Financial Resource Strain:   . Difficulty of Paying Living Expenses: Not on file  Food Insecurity:   . Worried About Charity fundraiser in the Last Year: Not on file  . Ran Out of Food in the Last Year: Not on file  Transportation Needs:   . Lack of Transportation (Medical): Not on file  . Lack of Transportation (Non-Medical): Not on file  Physical Activity:   . Days of Exercise per Week: Not on file  . Minutes of Exercise per Session: Not on file  Stress:   . Feeling of Stress : Not on file  Social Connections:   . Frequency of Communication with Friends and Family:  Not on file  . Frequency of Social Gatherings with Friends and Family: Not on file  . Attends Religious Services: Not on file  . Active Member of Clubs or Organizations: Not on file  . Attends Archivist Meetings: Not on file  . Marital Status: Not on file   Outpatient Encounter Medications as of 11/20/2019  Medication Sig  . ABILIFY 2 MG tablet Take 1 tablet (2 mg total) by mouth daily.  Marland Kitchen acetaminophen (TYLENOL) 325 MG tablet Take 650 mg by mouth 3 (three) times daily as needed for mild pain or moderate pain.   . calcium carbonate (TUMS - DOSED IN MG ELEMENTAL CALCIUM) 500 MG chewable tablet Chew 1 tablet by mouth 4 (four) times daily as needed for indigestion or heartburn.   . Carboxymethylcellulose Sodium (ARTIFICIAL TEARS OP) Place 1 drop into both eyes 3 (three) times daily.  . Cholecalciferol (VITAMIN D3) 50 MCG (2000 UT) TABS Take 1 tablet by mouth daily.  . clonazePAM (KLONOPIN) 0.5 MG tablet Take 1 tablet (0.5 mg total) by mouth 3 (three) times daily.  Marland Kitchen diltiazem (CARDIZEM CD) 120 MG 24 hr capsule Take 1 capsule (120 mg total) by mouth daily.  Marland Kitchen docusate sodium (COLACE) 100 MG capsule Take 200 mg by mouth at bedtime.   Marland Kitchen doxycycline (VIBRAMYCIN) 100 MG capsule Take 100 mg by mouth 2 (two) times daily.  . DULoxetine (CYMBALTA) 60 MG capsule Take 120 mg by mouth daily.   . fluticasone (FLONASE) 50 MCG/ACT nasal spray Place 2 sprays into both nostrils daily as needed for allergies or rhinitis.  Marland Kitchen gabapentin (NEURONTIN) 300 MG capsule Take 300 mg by mouth 3 (three) times daily.  Marland Kitchen HYDROcodone-acetaminophen (NORCO) 10-325 MG tablet Take 1 tablet by mouth in the morning, at noon, and at bedtime.   . hydrocortisone (ANUSOL-HC) 2.5 % rectal cream Place 1 application rectally 2 (two) times daily as needed.   . insulin detemir (LEVEMIR FLEXTOUCH) 100 UNIT/ML FlexPen Inject 38 Units into the skin at bedtime.  . iron polysaccharides (NIFEREX) 150 MG capsule Take 1 capsule (150 mg  total) by mouth daily.  Marland Kitchen latanoprost (XALATAN) 0.005 % ophthalmic solution Place 1 drop into both eyes at bedtime.  Marland Kitchen linagliptin (TRADJENTA) 5 MG TABS tablet Take 1 tablet (5 mg total) by mouth daily.  Marland Kitchen loratadine (CLARITIN) 10 MG tablet Take 10 mg by mouth daily.  Marland Kitchen lubiprostone (AMITIZA) 24 MCG capsule Take 1 capsule (24 mcg total) by mouth 2 (two) times daily with a meal.  . metoprolol succinate (TOPROL-XL) 25 MG 24 hr tablet Take 1 tablet (25 mg total) by mouth daily.  . midodrine (PROAMATINE) 10 MG tablet Take 1 tablet (10 mg total) by mouth 3 (  three) times daily.  . mirabegron ER (MYRBETRIQ) 25 MG TB24 tablet Take 25 mg by mouth daily.  . mirtazapine (REMERON) 30 MG tablet Take 30 mg by mouth at bedtime.  . ondansetron (ZOFRAN) 4 MG tablet Take 1 tablet (4 mg total) by mouth 4 (four) times daily -  before meals and at bedtime. (Patient taking differently: Take 4 mg by mouth 2 (two) times daily as needed for nausea or vomiting. )  . pantoprazole (PROTONIX) 40 MG tablet Take 1 tablet (40 mg total) by mouth 2 (two) times daily before a meal.  . phenol (CHLORASEPTIC) 1.4 % LIQD Use as directed 1 spray in the mouth or throat every 6 (six) hours as needed for throat irritation / pain.  . polyethylene glycol (MIRALAX / GLYCOLAX) 17 g packet Take 17 g by mouth 2 (two) times daily as needed for mild constipation or moderate constipation.  . risperiDONE (RISPERDAL) 1 MG tablet Take 1 tablet (1 mg total) by mouth in the morning and at bedtime.  . simvastatin (ZOCOR) 20 MG tablet Take 20 mg by mouth at bedtime.   Marland Kitchen tiZANidine (ZANAFLEX) 2 MG tablet Take 2 mg by mouth daily.  Marland Kitchen warfarin (COUMADIN) 5 MG tablet Take 1 tablet (5 mg total) by mouth every evening.  . zolpidem (AMBIEN) 10 MG tablet Take 10 mg by mouth at bedtime.   No facility-administered encounter medications on file as of 11/20/2019.   ALLERGIES: Allergies  Allergen Reactions  . Sulfa Antibiotics Rash   VACCINATION STATUS: There  is no immunization history for the selected administration types on file for this patient.  Diabetes She presents for her follow-up diabetic visit. She has type 2 diabetes mellitus. Onset time: She was diagnosed at approximate age of 18 years. Her disease course has been improving. There are no hypoglycemic associated symptoms. Pertinent negatives for hypoglycemia include no confusion, pallor or seizures. Associated symptoms include polydipsia and polyuria. Pertinent negatives for diabetes include no polyphagia. There are no hypoglycemic complications. Symptoms are improving. Diabetic complications include a CVA. Risk factors for coronary artery disease include dyslipidemia, diabetes mellitus, obesity, sedentary lifestyle and hypertension. Current diabetic treatment includes intensive insulin program and oral agent (monotherapy) (She kept requiring large dose of insulin due to consumption of large quantities of processed carbohydrates.Marland Kitchen). Her weight is stable. She is following a generally unhealthy diet. When asked about meal planning, she reported none. She never participates in exercise. Her home blood glucose trend is decreasing steadily. Her breakfast blood glucose range is generally 140-180 mg/dl. Her lunch blood glucose range is generally 180-200 mg/dl. Her dinner blood glucose range is generally 180-200 mg/dl. Her bedtime blood glucose range is generally >200 mg/dl. Her overall blood glucose range is 180-200 mg/dl. (She brings in a log showing improved glycemic profile fasting and postprandially.  Her recent point-of-care A1c was 10.6% increasing from 7.2%.   ) An ACE inhibitor/angiotensin II receptor blocker is being taken.  Hypertension This is a chronic problem. The current episode started more than 1 year ago. The problem is uncontrolled (Was found to have hypotension of 77/48, repeat 113/67.). Pertinent negatives include no palpitations or shortness of breath. Risk factors for coronary artery  disease include dyslipidemia and diabetes mellitus. Past treatments include beta blockers (She is currently on a low-dose metoprolol.). Hypertensive end-organ damage includes CVA.  Hyperlipidemia This is a chronic problem. The current episode started more than 1 year ago. Exacerbating diseases include diabetes. Pertinent negatives include no shortness of breath. Risk factors for  coronary artery disease include diabetes mellitus, dyslipidemia, hypertension, obesity and a sedentary lifestyle.    Review of systems: Limited as above.  Objective:    BP (!) 94/49   Pulse 92   Ht 5\' 5"  (1.651 m)   BMI 21.33 kg/m   Wt Readings from Last 3 Encounters:  11/11/19 128 lb 3.2 oz (58.2 kg)  11/06/19 125 lb (56.7 kg)  10/14/19 128 lb (58.1 kg)     Results for orders placed or performed in visit on 11/05/19  POCT INR  Result Value Ref Range   INR 2.9 2.0 - 3.0   Complete Blood Count (Most recent): Lab Results  Component Value Date   WBC 6.9 10/05/2019   HGB 13.2 10/05/2019   HCT 39.8 10/05/2019   MCV 91.1 10/05/2019   PLT 231 10/05/2019   Lab Results  Component Value Date   NA 137 10/05/2019   K 3.8 10/05/2019   CL 104 10/05/2019   CO2 28 10/05/2019   BUN 13 10/05/2019   CREATININE 0.61 10/05/2019   Diabetic Labs (most recent): Lab Results  Component Value Date   HGBA1C 10.6 (H) 10/05/2019   HGBA1C 10.5 (H) 10/04/2019   HGBA1C 7.2 (H) 06/27/2018   Lipid Panel     Component Value Date/Time   CHOL 151 10/05/2019 0457   TRIG 149 10/05/2019 0457   HDL 40 (L) 10/05/2019 0457   CHOLHDL 3.8 10/05/2019 0457   VLDL 30 10/05/2019 0457   LDLCALC 81 10/05/2019 0457     Assessment & Plan:   1. Type 2 diabetes mellitus with vascular disease ([place]) Her diabetes is  complicated by recurrent CVA. She brings in a log showing improved glycemic profile fasting and postprandially.  Her recent point-of-care A1c was 10.6% increasing from 7.2%.    - Patient remains at a high risk for more  acute and chronic complications of diabetes which include CAD, CVA, CKD, retinopathy, and neuropathy. These are all discussed in detail with the patient.  - I have re-counseled the patient on diet management and weight loss  by adopting a carbohydrate restricted / protein rich  Diet.  - Patient is advised to stick to a routine mealtimes to eat 3 meals  a day and avoid unnecessary snacks ( to snack only to correct hypoglycemia).  - I have approached patient with the following individualized plan to manage diabetes and patient agrees.   -Based on her response to adjustment of her basal insulin and adding Tradjenta, she will not need prandial insulin for now.    -She is advised to continue Levemir 38 units nightly, continue to monitor blood glucose twice a day-daily before breakfast and at bedtime.     -Patient is encouraged to call clinic for blood glucose levels less than 70 or above 300 mg /dl. -Continue Tradjenta 5 mg p.o. daily at breakfast.   - she is not a suitable candidate for metformin nor SGLT2 inhibitor therapy.   - Patient specific target  for A1c; LDL, HDL, Triglycerides, and  Waist Circumference were discussed in detail.  2) BP/HTN: She was found to have hypertension at 94/49.     She is only on a low-dose metoprolol.  She is currently on midodrine 10 mg p.o. twice daily.    3) Lipids/HPL: Her recent lipid panel from September 2018 showed LDL of 55.  She is advised to continue simvastatin 20 mg p.o. nightly.      4) Chronic Care/Health Maintenance:  -Continue to need one-on-one assistance at  mealtimes as well as diligent hydration.  Patient needs to keep her appointment with Ophthalmology, Podiatrist at least yearly or according to recommendations, and advised to  stay away from smoking. I have recommended yearly flu vaccine and pneumonia vaccination at least every 5 years; and  sleep for at least 7 hours a day.  I advised patient to maintain close follow up with her PCP for  primary care needs.  - Time spent on this patient care encounter:  35 min, of which > 50% was spent in  counseling and the rest reviewing her blood glucose logs , discussing her hypoglycemia and hyperglycemia episodes, reviewing her current and  previous labs / studies  ( including abstraction from other facilities) and medications  doses and developing a  long term treatment plan and documenting her care.   Please refer to Patient Instructions for Blood Glucose Monitoring and Insulin/Medications Dosing Guide"  in media tab for additional information. Please  also refer to " Patient Self Inventory" in the Media  tab for reviewed elements of pertinent patient history.  [name] participated in the discussions, expressed understanding, and voiced agreement with the above plans.  All questions were answered to her satisfaction. she is encouraged to contact clinic should she have any questions or concerns prior to her return visit.   Follow up plan: Return in about 3 months (around 02/20/2020) for Bring Meter and Logs- A1c in Office.  Glade Lloyd, MD Phone: [phone]  Fax: [phone]   This note was partially dictated with voice recognition software. Similar sounding words can be transcribed inadequately or may not  be corrected upon review.  11/20/2019, 9:43 AM

## 2019-11-22 DIAGNOSIS — E1142 Type 2 diabetes mellitus with diabetic polyneuropathy: Secondary | ICD-10-CM | POA: Diagnosis not present

## 2019-11-22 DIAGNOSIS — I10 Essential (primary) hypertension: Secondary | ICD-10-CM | POA: Diagnosis not present

## 2019-11-25 DIAGNOSIS — U071 COVID-19: Secondary | ICD-10-CM | POA: Diagnosis not present

## 2019-11-25 DIAGNOSIS — Z20828 Contact with and (suspected) exposure to other viral communicable diseases: Secondary | ICD-10-CM | POA: Diagnosis not present

## 2019-11-26 ENCOUNTER — Other Ambulatory Visit: Payer: Self-pay

## 2019-11-26 ENCOUNTER — Encounter: Payer: Self-pay | Admitting: Nurse Practitioner

## 2019-11-26 ENCOUNTER — Ambulatory Visit (INDEPENDENT_AMBULATORY_CARE_PROVIDER_SITE_OTHER): Payer: Medicare Other | Admitting: Nurse Practitioner

## 2019-11-26 VITALS — BP 83/53 | HR 67 | Temp 98.2°F | Ht 65.0 in | Wt 127.2 lb

## 2019-11-26 DIAGNOSIS — K582 Mixed irritable bowel syndrome: Secondary | ICD-10-CM | POA: Diagnosis not present

## 2019-11-26 DIAGNOSIS — K625 Hemorrhage of anus and rectum: Secondary | ICD-10-CM | POA: Diagnosis not present

## 2019-11-26 DIAGNOSIS — K219 Gastro-esophageal reflux disease without esophagitis: Secondary | ICD-10-CM

## 2019-11-26 DIAGNOSIS — K649 Unspecified hemorrhoids: Secondary | ICD-10-CM | POA: Diagnosis not present

## 2019-11-26 NOTE — Progress Notes (Signed)
Referring Provider: [name], MD Primary Care Physician:  [name], MD Primary GI:  Dr. Abbey Chatters  Chief Complaint  Patient presents with  . Gastroesophageal Reflux  . Constipation    "little"    HPI:   [name] is a 74 y.o. female who presents for follow-up on IBS and GERD.  The patient was last seen in our office 08/26/2019 for IBS mixed type, GERD, weight loss.  Noted history of IBS, IDA, GERD.  Previous bleeding work-up including colonoscopy/EGD/capsule study in 2016 felt IDA related to erosive gastropathy and enteropathy.  Historically IBS mixed type and well controlled on regimen of MiraLAX and Imodium.  Previously noted abdominal pain not severe, can tolerate okay.  At her last visit she was accompanied by staff member from the facility she lives.  Some worsening GERD some Protonix 40 mg daily using Tums more frequently.  Constipation and diarrhea with overall IBS doing well on current regimen.  Some unintentional weight loss with decreased appetite, objectively weight is within 3 pounds of previous visit 3 months prior.  Thinks that she would like Ensure or boost, uses Carnation currently at the facility.  No other overt GI complaints.  Recommended increase Protonix to 40 mg twice daily, Tums as needed, continue current IBS regimen, start nutrition supplement such as Ensure, boost, etc.  Follow-up in 3 months.  Today she is accompanied by a facility staff member.  Today she states she doing okay overall. GERD symptoms doing better on increased dose of Protonix. She is having left rib pain. Denies recent coughing, fall, bumps, twisting, lifting, or "sleeping funny." Denies N/V. Currently having a little constipation, but overall IBS doing well on current IBS-M regimen. A little intermittent abdominal pain ("not that bad.") Has seen blood in her stools rarely, typically on the toilet tissue. Previous colonoscopy found rectal bleeding due to moderate internal hemorrhoids.  Bleeding occurs "once in a while, not that bad." She states she is supposed to have cream for hemorrhoids as needed. Denies fever, chills, unintentional weight loss. Denies URI or flu-like symptoms. Denies loss of sense of taste or smell. The patient has received COVID-19 vaccination(s). Denies chest pain, dyspnea, dizziness, lightheadedness, syncope, near syncope. Denies any other upper or lower GI symptoms.  Past Medical History:  Diagnosis Date  . Anxiety   . Arthritis   . COVID-19 virus infection    COVID-19+ approx 01/24/19; asymptomatic course with full recovery  . Dependence on wheelchair    pivot/transfers  . Depression    History of psychosis and previous suicide attempt  . DVT, lower extremity, recurrent (HCC)    Long-term Coumadin per Dr. Legrand Rams  . Essential hypertension   . GERD (gastroesophageal reflux disease)   . Hemiplegia ([place]) 2010   Left side  . History of stroke    Acute infarct and right cerebral white matter small vessel disease 12/10  . Leg DVT (deep venous thromboembolism), acute ([place]) 2006  . Schizophrenia ([place])   . Stroke [facility])    left sided weakness  . Type 2 diabetes mellitus ([place])     Past Surgical History:  Procedure Laterality Date  . BACK SURGERY    . BIOPSY N/A 11/24/2014   Procedure: BIOPSY;  Surgeon: Danie Binder, MD;  Location: [facility];  Service: Endoscopy;  Laterality: N/A;  . BIOPSY  05/17/2016   Procedure: BIOPSY;  Surgeon: Danie Binder, MD;  Location: [facility];  Service: Endoscopy;;  gastric biopsy  . COLONOSCOPY WITH  PROPOFOL N/A 11/24/2014   Dr. Rudie Meyer polyps removed/moderate sized internal hemorrhoids, tubular adenomas. Next surveillance in 3 years  . ESOPHAGOGASTRODUODENOSCOPY (EGD) WITH PROPOFOL N/A 11/24/2014   Dr. Clayburn Pert HH/patent stricture at the gastroesophageal junction, mild non-erosive gastritis, path negative for H.pylori or celiac sprue  . ESOPHAGOGASTRODUODENOSCOPY (EGD) WITH PROPOFOL N/A 05/17/2016   Procedure:  ESOPHAGOGASTRODUODENOSCOPY (EGD) WITH PROPOFOL;  Surgeon: Danie Binder, MD;  Location: [facility];  Service: Endoscopy;  Laterality: N/A;  12:45pm  . GIVENS CAPSULE STUDY N/A 12/11/2014   MULTILPLE EROSION IN the stomach WITH ACTIVE OOZING. OCCASIONAL EROSIONS AND RARE ULCER SEEN IN PROXIMAL SMALL BOWEL . No masses or AVMs SEEN. NO OLD BLOOD OR FRESH BLOOD SEEN.   . POLYPECTOMY N/A 11/24/2014   Procedure: POLYPECTOMY;  Surgeon: Danie Binder, MD;  Location: [facility];  Service: Endoscopy;  Laterality: N/A;  . SAVORY DILATION N/A 05/17/2016   Procedure: SAVORY DILATION;  Surgeon: Danie Binder, MD;  Location: [facility];  Service: Endoscopy;  Laterality: N/A;    Current Outpatient Medications  Medication Sig Dispense Refill  . ABILIFY 2 MG tablet Take 1 tablet (2 mg total) by mouth daily. 28 tablet 2  . acetaminophen (TYLENOL) 325 MG tablet Take 650 mg by mouth 3 (three) times daily as needed for mild pain or moderate pain.     . calcium carbonate (TUMS - DOSED IN MG ELEMENTAL CALCIUM) 500 MG chewable tablet Chew 1 tablet by mouth 4 (four) times daily as needed for indigestion or heartburn.     . Carboxymethylcellulose Sodium (ARTIFICIAL TEARS OP) Place 1 drop into both eyes 3 (three) times daily.    . Cholecalciferol (VITAMIN D3) 50 MCG (2000 UT) TABS Take 1 tablet by mouth daily.    . clonazePAM (KLONOPIN) 0.5 MG tablet Take 1 tablet (0.5 mg total) by mouth 3 (three) times daily. 90 tablet 2  . diltiazem (CARDIZEM CD) 120 MG 24 hr capsule Take 1 capsule (120 mg total) by mouth daily. 30 capsule 6  . docusate sodium (COLACE) 100 MG capsule Take 200 mg by mouth at bedtime.     . DULoxetine (CYMBALTA) 60 MG capsule Take 120 mg by mouth daily.     . fluticasone (FLONASE) 50 MCG/ACT nasal spray Place 2 sprays into both nostrils daily as needed for allergies or rhinitis.    Marland Kitchen gabapentin (NEURONTIN) 300 MG capsule Take 300 mg by mouth 3 (three) times daily.    Marland Kitchen HYDROcodone-acetaminophen (NORCO)  10-325 MG tablet Take 1 tablet by mouth in the morning, at noon, and at bedtime.     . hydrocortisone (ANUSOL-HC) 2.5 % rectal cream Place 1 application rectally 2 (two) times daily as needed.     . insulin detemir (LEVEMIR FLEXTOUCH) 100 UNIT/ML FlexPen Inject 38 Units into the skin at bedtime. 15 mL 3  . iron polysaccharides (NIFEREX) 150 MG capsule Take 1 capsule (150 mg total) by mouth daily. 30 capsule 11  . latanoprost (XALATAN) 0.005 % ophthalmic solution Place 1 drop into both eyes at bedtime.    Marland Kitchen linagliptin (TRADJENTA) 5 MG TABS tablet Take 1 tablet (5 mg total) by mouth daily. 30 tablet 3  . loratadine (CLARITIN) 10 MG tablet Take 10 mg by mouth daily.    Marland Kitchen lubiprostone (AMITIZA) 24 MCG capsule Take 1 capsule (24 mcg total) by mouth 2 (two) times daily with a meal. 60 capsule 5  . metoprolol succinate (TOPROL-XL) 25 MG 24 hr tablet Take 1 tablet (25 mg total)  by mouth daily. 30 tablet 6  . midodrine (PROAMATINE) 10 MG tablet Take 1 tablet (10 mg total) by mouth 3 (three) times daily. 90 tablet 3  . mirabegron ER (MYRBETRIQ) 25 MG TB24 tablet Take 25 mg by mouth daily.    . mirtazapine (REMERON) 30 MG tablet Take 30 mg by mouth at bedtime.    . ondansetron (ZOFRAN) 4 MG tablet Take 1 tablet (4 mg total) by mouth 4 (four) times daily -  before meals and at bedtime. (Patient taking differently: Take 4 mg by mouth 2 (two) times daily as needed for nausea or vomiting. ) 120 tablet 3  . pantoprazole (PROTONIX) 40 MG tablet Take 1 tablet (40 mg total) by mouth 2 (two) times daily before a meal. 270 tablet 3  . phenol (CHLORASEPTIC) 1.4 % LIQD Use as directed 1 spray in the mouth or throat every 6 (six) hours as needed for throat irritation / pain.    . polyethylene glycol (MIRALAX / GLYCOLAX) 17 g packet Take 17 g by mouth 2 (two) times daily as needed for mild constipation or moderate constipation.    . risperiDONE (RISPERDAL) 1 MG tablet Take 1 tablet (1 mg total) by mouth in the morning and  at bedtime. 56 tablet 2  . simvastatin (ZOCOR) 20 MG tablet Take 20 mg by mouth at bedtime.     Marland Kitchen tiZANidine (ZANAFLEX) 2 MG tablet Take 2 mg by mouth daily.    Marland Kitchen warfarin (COUMADIN) 5 MG tablet Take 1 tablet (5 mg total) by mouth every evening. (Patient taking differently: Take 5 mg by mouth daily. Daily except 2.5mg  on Mondays) 30 tablet 6  . zolpidem (AMBIEN) 10 MG tablet Take 10 mg by mouth at bedtime.     No current facility-administered medications for this visit.    Allergies as of 11/26/2019 - Review Complete 11/26/2019  Allergen Reaction Noted  . Sulfa antibiotics Rash 02/23/2010    Family History  Problem Relation Age of Onset  . Hypertension Mother   . Colon cancer Neg Hx     Social History   Socioeconomic History  . Marital status: Widowed    Spouse name: Not on file  . Number of children: Not on file  . Years of education: Not on file  . Highest education level: Not on file  Occupational History  . Not on file  Tobacco Use  . Smoking status: Former Smoker    Packs/day: 0.25    Years: 20.00    Pack years: 5.00    Types: Cigarettes    Quit date: 01/24/1995    Years since quitting: 24.8  . Smokeless tobacco: Never Used  Vaping Use  . Vaping Use: Never used  Substance and Sexual Activity  . Alcohol use: No    Alcohol/week: 0.0 standard drinks  . Drug use: No  . Sexual activity: Never    Birth control/protection: Post-menopausal  Other Topics Concern  . Not on file  Social History Narrative  . Not on file   Social Determinants of Health   Financial Resource Strain:   . Difficulty of Paying Living Expenses: Not on file  Food Insecurity:   . Worried About Charity fundraiser in the Last Year: Not on file  . Ran Out of Food in the Last Year: Not on file  Transportation Needs:   . Lack of Transportation (Medical): Not on file  . Lack of Transportation (Non-Medical): Not on file  Physical Activity:   . Days of  Exercise per Week: Not on file  . Minutes  of Exercise per Session: Not on file  Stress:   . Feeling of Stress : Not on file  Social Connections:   . Frequency of Communication with Friends and Family: Not on file  . Frequency of Social Gatherings with Friends and Family: Not on file  . Attends Religious Services: Not on file  . Active Member of Clubs or Organizations: Not on file  . Attends Archivist Meetings: Not on file  . Marital Status: Not on file    Subjective: Review of Systems  Constitutional: Negative for chills, fever, malaise/fatigue and weight loss.  HENT: Negative for congestion and sore throat.   Respiratory: Negative for cough and shortness of breath.   Cardiovascular: Positive for chest pain (left ribs). Negative for palpitations.  Gastrointestinal: Positive for blood in stool (knwon hemorrhoids), constipation (minimal with known IBS-M) and heartburn (rare). Negative for abdominal pain, diarrhea, melena, nausea and vomiting.  Musculoskeletal: Negative for joint pain and myalgias.  Skin: Negative for rash.  Neurological: Negative for dizziness and weakness.  Endo/Heme/Allergies: Does not bruise/bleed easily.  Psychiatric/Behavioral: Negative for depression. The patient is not nervous/anxious.   All other systems reviewed and are negative.    Objective: BP (!) 83/53   Pulse 67   Temp 98.2 F (36.8 C) (Temporal)   Ht 5\' 5"  (1.651 m)   Wt 127 lb 3.2 oz (57.7 kg)   BMI 21.17 kg/m  Physical Exam Vitals and nursing note reviewed.  Constitutional:      General: She is not in acute distress.    Appearance: Normal appearance. She is well-developed and normal weight. She is not ill-appearing, toxic-appearing or diaphoretic.  HENT:     Head: Normocephalic and atraumatic.     Nose: No congestion or rhinorrhea.  Eyes:     General: No scleral icterus. Cardiovascular:     Rate and Rhythm: Normal rate. Rhythm irregular.     Heart sounds: Murmur heard.  Systolic murmur is present with a grade of  4/6.   Pulmonary:     Effort: Pulmonary effort is normal. No respiratory distress.     Breath sounds: Normal breath sounds.  Abdominal:     General: Bowel sounds are normal.     Palpations: Abdomen is soft. There is no hepatomegaly, splenomegaly or mass.     Tenderness: There is no abdominal tenderness. There is no guarding or rebound.     Hernia: No hernia is present.  Musculoskeletal:       Arms:  Skin:    General: Skin is warm and dry.     Coloration: Skin is not jaundiced.     Findings: No rash.  Neurological:     General: No focal deficit present.     Mental Status: She is alert and oriented to person, place, and time.  Psychiatric:        Attention and Perception: Attention normal.        Mood and Affect: Mood normal.        Speech: Speech normal.        Behavior: Behavior normal.        Thought Content: Thought content normal.        Cognition and Memory: Cognition and memory normal.      Assessment:  Very pleasant 74 year old female presents for follow-up on GERD, IBS mixed type.  Overall she is stable/doing well.  GERD: Improved on increased dose of Protonix 40 mg twice daily.  Minimal to no breakthrough symptoms.  She does have left sided rib pain that was thought to potentially be GI in etiology, but it seems to be more musculoskeletal versus chest pain.  Recommend continue current meds  IBS mixed type: Doing well on her current regimen as noted in HPI.  Currently she is experiencing some constipation but she states it is not very bad.  Minimal intermittent abdominal discomfort that is tolerable.  She is very well aware of what medications she needs to take depending on her current bowel movements and she seems to manage this quite well.  No changes anticipated at this point given her successes.  Rectal bleeding: Rare scant toilet tissue hematochezia with previous colonoscopy noting moderate size internal hemorrhoids as source of bleeding.  She does have Anusol  ordered but has not been asking for it.  I encouraged her to ask for Anusol she is any rectal bleeding or experiences hemorrhoid symptoms.  She verbalized understanding.  The nurse was accompanied her indicates she will reinforce this to the facility  Left-sided "rib pain": There is initially some concern for possible GI symptoms but given that her GERD is doing well I doubt this is GERD/gastritis.  Additionally on exam she is somewhat tender to the left ribs with palpation.  I recommended that he schedule a follow-up with primary care to evaluate, may need cardiac work-up versus chest x-ray.  Query possible costochondritis or pulled muscle that may benefit from anti-inflammatory.  The staff accompanying the patient indicates that she will make this recommendation to the facility   Plan: 1. Continue current IBS regimen 2. Continue Protonix 40 mg twice daily 3. Follow-up with primary care for left-sided rib/chest pain 4. Anusol as needed for hemorrhoid symptoms 5. Call for any worsening symptoms 6. Follow-up in 6 months.    Thank you for allowing [place] to participate in the care of Philis Kendall, DNP, AGNP-C Adult & Gerontological Nurse Practitioner [facility]   11/26/2019 10:53 AM   Disclaimer: This note was dictated with voice recognition software. Similar sounding words can inadvertently be transcribed and may not be corrected upon review.

## 2019-11-26 NOTE — Patient Instructions (Signed)
Your health issues we discussed today were:   GERD (reflux/heartburn): 1. Glad you are doing better on Protonix twice daily 2. You can use Tums as needed for breakthrough reflux symptoms 3. Continue to take your current medications 4. Let us know if you have any worsening or severe symptoms  IBS-M (irritable bowel syndrome, mixed type with constipation and diarrhea): 1. I am glad you are doing well 2. Continue your current regimen with Imodium when having diarrhea and Amitiza when having constipation 3. Call us if your abdominal pain or your stools get worse  Rectal bleeding with known hemorrhoids: 1. As we discussed, your last colonoscopy showed moderate-sized internal hemorrhoids 2. This is likely the cause of your occasional bleeding just on the toilet tissue 3. You do have Anusol rectal cream prescribed for hemorrhoid symptoms 4. Ask for Anusol if you see bleeding on the toilet tissue or if you have rectal pain/irritation which are symptoms of hemorrhoids 5. Call us for any worsening or severe symptoms especially worsening or severe bleeding  Overall I recommend:  1. Continue other current medications 2. Return for follow-up in 6 months 3. Call us for any questions or concerns   ---------------------------------------------------------------  I am glad you have gotten your COVID-19 vaccination!  Even though you are fully vaccinated you should continue to follow CDC and state/local guidelines.  ---------------------------------------------------------------   At [facility] we value your feedback. You may receive a survey about your visit today. Please share your experience as we strive to create trusting relationships with our patients to provide genuine, compassionate, quality care.  We appreciate your understanding and patience as we review any laboratory studies, imaging, and other diagnostic tests that are ordered as we care for you. Our office policy is 5  business days for review of these results, and any emergent or urgent results are addressed in a timely manner for your best interest. If you do not hear from our office in 1 week, please contact us.   We also encourage the use of MyChart, which contains your medical information for your review as well. If you are not enrolled in this feature, an access code is on this after visit summary for your convenience. Thank you for allowing [place] to be involved in your care.  It was great to see you today!  I hope you have a Happy Thanksgiving!!

## 2019-11-27 ENCOUNTER — Other Ambulatory Visit: Payer: Self-pay | Admitting: *Deleted

## 2019-11-27 DIAGNOSIS — U071 COVID-19: Secondary | ICD-10-CM | POA: Diagnosis not present

## 2019-11-27 DIAGNOSIS — Z20828 Contact with and (suspected) exposure to other viral communicable diseases: Secondary | ICD-10-CM | POA: Diagnosis not present

## 2019-11-27 MED ORDER — METOPROLOL SUCCINATE ER 25 MG PO TB24
25.0000 mg | ORAL_TABLET | Freq: Every day | ORAL | 3 refills | Status: DC
Start: 2019-11-27 — End: 2020-04-26

## 2019-12-02 DIAGNOSIS — Z20828 Contact with and (suspected) exposure to other viral communicable diseases: Secondary | ICD-10-CM | POA: Diagnosis not present

## 2019-12-02 DIAGNOSIS — U071 COVID-19: Secondary | ICD-10-CM | POA: Diagnosis not present

## 2019-12-04 ENCOUNTER — Ambulatory Visit (INDEPENDENT_AMBULATORY_CARE_PROVIDER_SITE_OTHER): Payer: Medicare Other | Admitting: *Deleted

## 2019-12-04 DIAGNOSIS — I4891 Unspecified atrial fibrillation: Secondary | ICD-10-CM | POA: Diagnosis not present

## 2019-12-04 DIAGNOSIS — Z8679 Personal history of other diseases of the circulatory system: Secondary | ICD-10-CM

## 2019-12-04 DIAGNOSIS — Z20828 Contact with and (suspected) exposure to other viral communicable diseases: Secondary | ICD-10-CM | POA: Diagnosis not present

## 2019-12-04 DIAGNOSIS — Z5181 Encounter for therapeutic drug level monitoring: Secondary | ICD-10-CM | POA: Diagnosis not present

## 2019-12-04 DIAGNOSIS — U071 COVID-19: Secondary | ICD-10-CM | POA: Diagnosis not present

## 2019-12-04 LAB — POCT INR: INR: 3.7 — AB (ref 2.0–3.0)

## 2019-12-04 NOTE — Patient Instructions (Signed)
Hold warfarin tonight, take 2.5mg  tomorrow night then resume 5mg  daily except 2.5mg  on Mondays  Recheck in 3 weeks Orders written for [facility] [phone]

## 2019-12-09 DIAGNOSIS — U071 COVID-19: Secondary | ICD-10-CM | POA: Diagnosis not present

## 2019-12-09 DIAGNOSIS — Z20828 Contact with and (suspected) exposure to other viral communicable diseases: Secondary | ICD-10-CM | POA: Diagnosis not present

## 2019-12-11 DIAGNOSIS — M79675 Pain in left toe(s): Secondary | ICD-10-CM | POA: Diagnosis not present

## 2019-12-11 DIAGNOSIS — M79672 Pain in left foot: Secondary | ICD-10-CM | POA: Diagnosis not present

## 2019-12-11 DIAGNOSIS — E1140 Type 2 diabetes mellitus with diabetic neuropathy, unspecified: Secondary | ICD-10-CM | POA: Diagnosis not present

## 2019-12-11 DIAGNOSIS — M79674 Pain in right toe(s): Secondary | ICD-10-CM | POA: Diagnosis not present

## 2019-12-11 DIAGNOSIS — U071 COVID-19: Secondary | ICD-10-CM | POA: Diagnosis not present

## 2019-12-11 DIAGNOSIS — I739 Peripheral vascular disease, unspecified: Secondary | ICD-10-CM | POA: Diagnosis not present

## 2019-12-11 DIAGNOSIS — Z20828 Contact with and (suspected) exposure to other viral communicable diseases: Secondary | ICD-10-CM | POA: Diagnosis not present

## 2019-12-11 DIAGNOSIS — M79671 Pain in right foot: Secondary | ICD-10-CM | POA: Diagnosis not present

## 2019-12-16 DIAGNOSIS — U071 COVID-19: Secondary | ICD-10-CM | POA: Diagnosis not present

## 2019-12-16 DIAGNOSIS — Z20828 Contact with and (suspected) exposure to other viral communicable diseases: Secondary | ICD-10-CM | POA: Diagnosis not present

## 2019-12-17 DIAGNOSIS — R296 Repeated falls: Secondary | ICD-10-CM | POA: Diagnosis not present

## 2019-12-17 DIAGNOSIS — S01302A Unspecified open wound of left ear, initial encounter: Secondary | ICD-10-CM | POA: Diagnosis not present

## 2019-12-17 DIAGNOSIS — E1165 Type 2 diabetes mellitus with hyperglycemia: Secondary | ICD-10-CM | POA: Diagnosis not present

## 2019-12-17 DIAGNOSIS — G8190 Hemiplegia, unspecified affecting unspecified side: Secondary | ICD-10-CM | POA: Diagnosis not present

## 2019-12-17 DIAGNOSIS — I10 Essential (primary) hypertension: Secondary | ICD-10-CM | POA: Diagnosis not present

## 2019-12-22 NOTE — Progress Notes (Signed)
Virtual Visit via Telephone Note  I connected with [name] on 12/31/19 at 11:00 AM EST by telephone and verified that I am speaking with the correct person using two identifiers.  Location: Patient: group home Provider: office Persons participated in the visit- patient, provider,    I discussed the limitations, risks, security and privacy concerns of performing an evaluation and management service by telephone and the availability of in person appointments. I also discussed with the patient that there may be a patient responsible charge related to this service. The patient expressed understanding and agreed to proceed.   I discussed the assessment and treatment plan with the patient. The patient was provided an opportunity to ask questions and all were answered. The patient agreed with the plan and demonstrated an understanding of the instructions.   The patient was advised to call back or seek an in-person evaluation if the symptoms worsen or if the condition fails to improve as anticipated.  I provided 12 minutes of non-face-to-face time during this encounter.   [name], MD    [facility] MD/PA/NP OP Progress Note  12/31/2019 11:29 AM [name]  MRN:  [id number]  Chief Complaint:  Chief Complaint    Follow-up; Other     HPI:  This is a follow-up appointment for schizophrenia.  She states that she does not feel good today.  She stays nervous all the time without any reason.  She states that her mood is usually good otherwise.  She had a fine day on Thanksgiving.  She called her daughter, and her granddaughter on that day.  While talking about discontinuation of Abilify, she reports strong preference to stay on this medication, although she is unable to elaborate the reason.  She sleeps well.  She has good appetite.  She denies SI, HI.  She denies any hallucinations or paranoia.   [name], caregiver at [facility] presents to the interview.  There has been no significant  change since the last visit.  There is no behavior issues/anger outburst.  She tends to stay in the room most of the time, watching TV. [name] reports concern that the patient did try to be off Abilify in the past, and it did not go well. She takes medication regularly.    Activities of Daily Living (ADLs):  Courtney L Thorpeis dependent in the following: feeding,  Dependent-bathing and hygiene,grooming and toileting, continence, in awheelchair   Employment: unemployed. used to work for [employer], on disability around ten years ago after her back surg Marital status: Widow, her husband deceased after 10 years of marriage in 2010 Number of children: 22, 23 year old daughter. She has a granddaughter  Visit Diagnosis:    ICD-10-CM   1. MDD (major depressive disorder), recurrent, in partial remission ([place])  F33.41   2. Schizophrenia, unspecified type ([place])  F20.9 risperiDONE (RISPERDAL) 1 MG tablet    ABILIFY 2 MG tablet    Past Psychiatric History: Please see initial evaluation for full details. I have reviewed the history. No updates at this time.     Past Medical History:  Past Medical History:  Diagnosis Date  . Anxiety   . Arthritis   . COVID-19 virus infection    COVID-19+ approx 01/24/19; asymptomatic course with full recovery  . Dependence on wheelchair    pivot/transfers  . Depression    History of psychosis and previous suicide attempt  . DVT, lower extremity, recurrent (HCC)    Long-term Coumadin per Dr. Legrand Rams  .  Essential hypertension   . GERD (gastroesophageal reflux disease)   . Hemiplegia ([place]) 2010   Left side  . History of stroke    Acute infarct and right cerebral white matter small vessel disease 12/10  . Leg DVT (deep venous thromboembolism), acute ([place]) 2006  . Schizophrenia ([place])   . Stroke [facility])    left sided weakness  . Type 2 diabetes mellitus ([place])     Past Surgical History:  Procedure Laterality Date  . BACK SURGERY    . BIOPSY N/A  11/24/2014   Procedure: BIOPSY;  Surgeon: Danie Binder, MD;  Location: [facility];  Service: Endoscopy;  Laterality: N/A;  . BIOPSY  05/17/2016   Procedure: BIOPSY;  Surgeon: Danie Binder, MD;  Location: [facility] SU[facility];  Service: Endoscopy;;  gastric biopsy  . COLONOSCOPY WITH PROPOFOL N/A 11/24/2014   Dr. Rudie Meyer polyps removed/moderate sized internal hemorrhoids, tubular adenomas. Next surveillance in 3 years  . ESOPHAGOGASTRODUODENOSCOPY (EGD) WITH PROPOFOL N/A 11/24/2014   Dr. Clayburn Pert HH/patent stricture at the gastroesophageal junction, mild non-erosive gastritis, path negative for H.pylori or celiac sprue  . ESOPHAGOGASTRODUODENOSCOPY (EGD) WITH PROPOFOL N/A 05/17/2016   Procedure: ESOPHAGOGASTRODUODENOSCOPY (EGD) WITH PROPOFOL;  Surgeon: Danie Binder, MD;  Location: [facility];  Service: Endoscopy;  Laterality: N/A;  12:45pm  . GIVENS CAPSULE STUDY N/A 12/11/2014   MULTILPLE EROSION IN the stomach WITH ACTIVE OOZING. OCCASIONAL EROSIONS AND RARE ULCER SEEN IN PROXIMAL SMALL BOWEL . No masses or AVMs SEEN. NO OLD BLOOD OR FRESH BLOOD SEEN.   . POLYPECTOMY N/A 11/24/2014   Procedure: POLYPECTOMY;  Surgeon: Danie Binder, MD;  Location: [facility];  Service: Endoscopy;  Laterality: N/A;  . SAVORY DILATION N/A 05/17/2016   Procedure: SAVORY DILATION;  Surgeon: Danie Binder, MD;  Location: [facility];  Service: Endoscopy;  Laterality: N/A;    Family Psychiatric History: Please see initial evaluation for full details. I have reviewed the history. No updates at this time.     Family History:  Family History  Problem Relation Age of Onset  . Hypertension Mother   . Colon cancer Neg Hx     Social History:  Social History   Socioeconomic History  . Marital status: Widowed    Spouse name: Not on file  . Number of children: Not on file  . Years of education: Not on file  . Highest education level: Not on file  Occupational History  . Not on file  Tobacco Use  . Smoking  status: Former Smoker    Packs/day: 0.25    Years: 20.00    Pack years: 5.00    Types: Cigarettes    Quit date: 01/24/1995    Years since quitting: 24.9  . Smokeless tobacco: Never Used  Vaping Use  . Vaping Use: Never used  Substance and Sexual Activity  . Alcohol use: No    Alcohol/week: 0.0 standard drinks  . Drug use: No  . Sexual activity: Never    Birth control/protection: Post-menopausal  Other Topics Concern  . Not on file  Social History Narrative  . Not on file   Social Determinants of Health   Financial Resource Strain:   . Difficulty of Paying Living Expenses: Not on file  Food Insecurity:   . Worried About Charity fundraiser in the Last Year: Not on file  . Ran Out of Food in the Last Year: Not on file  Transportation Needs:   . Lack of Transportation (Medical): Not  on file  . Lack of Transportation (Non-Medical): Not on file  Physical Activity:   . Days of Exercise per Week: Not on file  . Minutes of Exercise per Session: Not on file  Stress:   . Feeling of Stress : Not on file  Social Connections:   . Frequency of Communication with Friends and Family: Not on file  . Frequency of Social Gatherings with Friends and Family: Not on file  . Attends Religious Services: Not on file  . Active Member of Clubs or Organizations: Not on file  . Attends Archivist Meetings: Not on file  . Marital Status: Not on file    Allergies:  Allergies  Allergen Reactions  . Sulfa Antibiotics Rash    Metabolic Disorder Labs: Lab Results  Component Value Date   HGBA1C 10.6 (H) 10/05/2019   MPG 257.52 10/05/2019   MPG 254.65 10/04/2019   No results found for: PROLACTIN Lab Results  Component Value Date   CHOL 151 10/05/2019   TRIG 149 10/05/2019   HDL 40 (L) 10/05/2019   CHOLHDL 3.8 10/05/2019   VLDL 30 10/05/2019   LDLCALC 81 10/05/2019   LDLCALC 55 10/07/2016   Lab Results  Component Value Date   TSH 0.578 10/05/2019   TSH 1.138 03/29/2017     Therapeutic Level Labs: No results found for: LITHIUM No results found for: VALPROATE No components found for:  CBMZ  Current Medications: Current Outpatient Medications  Medication Sig Dispense Refill  . ABILIFY 2 MG tablet Take 1 tablet (2 mg total) by mouth daily. 28 tablet 3  . acetaminophen (TYLENOL) 325 MG tablet Take 650 mg by mouth 3 (three) times daily as needed for mild pain or moderate pain.     . calcium carbonate (TUMS - DOSED IN MG ELEMENTAL CALCIUM) 500 MG chewable tablet Chew 1 tablet by mouth 4 (four) times daily as needed for indigestion or heartburn.     . Carboxymethylcellulose Sodium (ARTIFICIAL TEARS OP) Place 1 drop into both eyes 3 (three) times daily.    . Cholecalciferol (VITAMIN D3) 50 MCG (2000 UT) TABS Take 1 tablet by mouth daily.    . clonazePAM (KLONOPIN) 0.5 MG tablet Take 1 tablet (0.5 mg total) by mouth 3 (three) times daily. 90 tablet 2  . diltiazem (CARDIZEM CD) 120 MG 24 hr capsule Take 1 capsule (120 mg total) by mouth daily. 90 capsule 1  . docusate sodium (COLACE) 100 MG capsule Take 200 mg by mouth at bedtime.     . DULoxetine (CYMBALTA) 60 MG capsule Take 120 mg by mouth daily.     . fluticasone (FLONASE) 50 MCG/ACT nasal spray Place 2 sprays into both nostrils daily as needed for allergies or rhinitis.    Marland Kitchen gabapentin (NEURONTIN) 300 MG capsule Take 300 mg by mouth 3 (three) times daily.    Marland Kitchen HYDROcodone-acetaminophen (NORCO) 10-325 MG tablet Take 1 tablet by mouth in the morning, at noon, and at bedtime.     . hydrocortisone (ANUSOL-HC) 2.5 % rectal cream Place 1 application rectally 2 (two) times daily as needed.     . insulin detemir (LEVEMIR FLEXTOUCH) 100 UNIT/ML FlexPen Inject 38 Units into the skin at bedtime. 15 mL 3  . iron polysaccharides (NIFEREX) 150 MG capsule Take 1 capsule (150 mg total) by mouth daily. 30 capsule 11  . Lancets 28G MISC USE TO CHECK BLOOD SUGAR TWICE DAILY. 100 each 0  . latanoprost (XALATAN) 0.005 % ophthalmic  solution Place 1 drop  into both eyes at bedtime.    Marland Kitchen linagliptin (TRADJENTA) 5 MG TABS tablet Take 1 tablet (5 mg total) by mouth daily. 30 tablet 3  . loratadine (CLARITIN) 10 MG tablet Take 10 mg by mouth daily.    Marland Kitchen lubiprostone (AMITIZA) 24 MCG capsule Take 1 capsule (24 mcg total) by mouth 2 (two) times daily with a meal. 60 capsule 5  . metoprolol succinate (TOPROL-XL) 25 MG 24 hr tablet Take 1 tablet (25 mg total) by mouth daily. 90 tablet 3  . midodrine (PROAMATINE) 10 MG tablet Take 1 tablet (10 mg total) by mouth 3 (three) times daily. 90 tablet 3  . mirabegron ER (MYRBETRIQ) 25 MG TB24 tablet Take 25 mg by mouth daily.    . mirtazapine (REMERON) 30 MG tablet Take 30 mg by mouth at bedtime.    . ondansetron (ZOFRAN) 4 MG tablet Take 1 tablet (4 mg total) by mouth 4 (four) times daily -  before meals and at bedtime. (Patient taking differently: Take 4 mg by mouth 2 (two) times daily as needed for nausea or vomiting. ) 120 tablet 3  . pantoprazole (PROTONIX) 40 MG tablet Take 1 tablet (40 mg total) by mouth 2 (two) times daily before a meal. 270 tablet 3  . phenol (CHLORASEPTIC) 1.4 % LIQD Use as directed 1 spray in the mouth or throat every 6 (six) hours as needed for throat irritation / pain.    . polyethylene glycol (MIRALAX / GLYCOLAX) 17 g packet Take 17 g by mouth 2 (two) times daily as needed for mild constipation or moderate constipation.    . risperiDONE (RISPERDAL) 1 MG tablet Take 1 tablet (1 mg total) by mouth in the morning and at bedtime. 56 tablet 3  . simvastatin (ZOCOR) 20 MG tablet Take 20 mg by mouth at bedtime.     Marland Kitchen tiZANidine (ZANAFLEX) 2 MG tablet Take 2 mg by mouth daily.    Marland Kitchen warfarin (COUMADIN) 5 MG tablet Take 1 tablet (5 mg total) by mouth every evening. (Patient taking differently: Take 5 mg by mouth daily. Daily except 2.5mg  on Mondays) 30 tablet 6  . zolpidem (AMBIEN) 10 MG tablet Take 10 mg by mouth at bedtime.     No current facility-administered  medications for this visit.     Musculoskeletal: Strength & Muscle Tone: N/A Gait & Station: N/A Patient leans: N/A  Psychiatric Specialty Exam: Review of Systems  Psychiatric/Behavioral: Negative for agitation, behavioral problems, confusion, decreased concentration, dysphoric mood, hallucinations, self-injury, sleep disturbance and suicidal ideas. The patient is nervous/anxious. The patient is not hyperactive.   All other systems reviewed and are negative.   There were no vitals taken for this visit.There is no height or weight on file to calculate BMI.  General Appearance: NA  Eye Contact:  NA  Speech:  Clear and Coherent  Volume:  Normal  Mood:  Anxious  Affect:  NA  Thought Process:  Coherent  Orientation:  Full (Time, Place, and Person)  Thought Content: Logical   Suicidal Thoughts:  No  Homicidal Thoughts:  No  Memory:  Immediate;   Good  Judgement:  Good  Insight:  Shallow  Psychomotor Activity:  Normal  Concentration:  Concentration: Good and Attention Span: Good  Recall:  Good  Fund of Knowledge: Good  Language: Good  Akathisia:  No  Handed:  Right  AIMS (if indicated): not done  Assets:  Communication Skills Desire for Improvement  ADL's:  Intact  Cognition: WNL  Sleep:  Good   Screenings: PHQ2-9     Office Visit from 11/28/2017 in [facility] Office Visit from 09/25/2017 in [facility] Office Visit from 03/28/2017 in [facility] Office Visit from 09/13/2016 in [facility] Office Visit from 06/07/2016 in [facility]  PHQ-2 Total Score 0 0 0 0 0       Assessment and Plan:  SHA BURLING is a 74 y.o. year old female with a history of schizophrenia, depression,SVT, DVT, history of stroke on warfarin, type II diabetes, hypertension, GERD, who presents for follow up appointment for below.   1. Schizophrenia, unspecified type ([place]) 2. MDD  (major depressive disorder), recurrent, in partial remission ([place]) There has been no significant behavior issues since the last visit.  Although it was recommended to be off Abilify to avoid polypharmacy, both the patient and the caregiver reports strong preference to stay on the medication due to history of worsening in her behavior in the past in the context of tapering off this medication.  Will continue Abilify to target schizophrenia.  Will continue Risperdal to target schizophrenia.  Discussed potential metabolic side effect and EPS.  Noted that she did have worsening withdrawal symptoms/for aggression in the context of tapering off Risperdal.   Plan 1. ContinueAbilify2 mg daily 2. ContinueRisperidone 1 mgtwice a day  3.Next appointment:  3/9 at 9:30 for 30 mins - Fax AVS to [phone] - onclonazepam 0.5 mg three timesa day for anxiety (prescribed by Dr. Merlene Laughter) - on mirtazapine 30 mg at night, prescribed by Dr. Legrand Rams - onduloxetine 120 mg daily(prescribed byDr. Legrand Rams. - onZolpidem 10 mg at night-prescribed by Dr. Legrand Rams -on gabapentin 300 mg TID   The patient demonstrates the following risk factors for suicide: Chronic risk factors for suicide include:psychiatric disorder ofschizophrenia by history, depression. Acute risk factorsfor suicide include: unemployment. Protective factorsfor this patient include: positive social support and hope for the future. Considering these factors, the overall suicide risk at this point appears to below. Patientisappropriate for outpatient follow up.  [name], MD 12/31/2019, 11:29 AM

## 2019-12-23 ENCOUNTER — Other Ambulatory Visit: Payer: Self-pay | Admitting: *Deleted

## 2019-12-23 DIAGNOSIS — Z7901 Long term (current) use of anticoagulants: Secondary | ICD-10-CM | POA: Diagnosis not present

## 2019-12-23 DIAGNOSIS — F209 Schizophrenia, unspecified: Secondary | ICD-10-CM | POA: Diagnosis not present

## 2019-12-23 DIAGNOSIS — E1151 Type 2 diabetes mellitus with diabetic peripheral angiopathy without gangrene: Secondary | ICD-10-CM | POA: Diagnosis not present

## 2019-12-23 DIAGNOSIS — I69354 Hemiplegia and hemiparesis following cerebral infarction affecting left non-dominant side: Secondary | ICD-10-CM | POA: Diagnosis not present

## 2019-12-23 DIAGNOSIS — I4891 Unspecified atrial fibrillation: Secondary | ICD-10-CM | POA: Diagnosis not present

## 2019-12-23 DIAGNOSIS — Z7984 Long term (current) use of oral hypoglycemic drugs: Secondary | ICD-10-CM | POA: Diagnosis not present

## 2019-12-23 DIAGNOSIS — R296 Repeated falls: Secondary | ICD-10-CM | POA: Diagnosis not present

## 2019-12-23 DIAGNOSIS — Z794 Long term (current) use of insulin: Secondary | ICD-10-CM | POA: Diagnosis not present

## 2019-12-23 DIAGNOSIS — M21372 Foot drop, left foot: Secondary | ICD-10-CM | POA: Diagnosis not present

## 2019-12-23 DIAGNOSIS — H6690 Otitis media, unspecified, unspecified ear: Secondary | ICD-10-CM | POA: Diagnosis not present

## 2019-12-23 DIAGNOSIS — U071 COVID-19: Secondary | ICD-10-CM | POA: Diagnosis not present

## 2019-12-23 DIAGNOSIS — Z20828 Contact with and (suspected) exposure to other viral communicable diseases: Secondary | ICD-10-CM | POA: Diagnosis not present

## 2019-12-23 MED ORDER — DILTIAZEM HCL ER COATED BEADS 120 MG PO CP24
120.0000 mg | ORAL_CAPSULE | Freq: Every day | ORAL | 1 refills | Status: DC
Start: 2019-12-23 — End: 2020-04-26

## 2019-12-24 ENCOUNTER — Ambulatory Visit (INDEPENDENT_AMBULATORY_CARE_PROVIDER_SITE_OTHER): Payer: Medicare Other | Admitting: *Deleted

## 2019-12-24 DIAGNOSIS — I4891 Unspecified atrial fibrillation: Secondary | ICD-10-CM | POA: Diagnosis not present

## 2019-12-24 DIAGNOSIS — Z8679 Personal history of other diseases of the circulatory system: Secondary | ICD-10-CM | POA: Diagnosis not present

## 2019-12-24 DIAGNOSIS — Z5181 Encounter for therapeutic drug level monitoring: Secondary | ICD-10-CM

## 2019-12-24 LAB — POCT INR: INR: 2.1 (ref 2.0–3.0)

## 2019-12-24 NOTE — Patient Instructions (Signed)
Continue warfarin 5mg  daily except 2.5mg  on Mondays  Recheck in 4 weeks Orders written for [facility] [phone]

## 2019-12-25 ENCOUNTER — Other Ambulatory Visit: Payer: Self-pay | Admitting: "Endocrinology

## 2019-12-25 DIAGNOSIS — F209 Schizophrenia, unspecified: Secondary | ICD-10-CM | POA: Diagnosis not present

## 2019-12-25 DIAGNOSIS — U071 COVID-19: Secondary | ICD-10-CM | POA: Diagnosis not present

## 2019-12-25 DIAGNOSIS — Z20828 Contact with and (suspected) exposure to other viral communicable diseases: Secondary | ICD-10-CM | POA: Diagnosis not present

## 2019-12-25 DIAGNOSIS — I4891 Unspecified atrial fibrillation: Secondary | ICD-10-CM | POA: Diagnosis not present

## 2019-12-25 DIAGNOSIS — H6690 Otitis media, unspecified, unspecified ear: Secondary | ICD-10-CM | POA: Diagnosis not present

## 2019-12-25 DIAGNOSIS — I69354 Hemiplegia and hemiparesis following cerebral infarction affecting left non-dominant side: Secondary | ICD-10-CM | POA: Diagnosis not present

## 2019-12-25 DIAGNOSIS — R296 Repeated falls: Secondary | ICD-10-CM | POA: Diagnosis not present

## 2019-12-25 DIAGNOSIS — E1151 Type 2 diabetes mellitus with diabetic peripheral angiopathy without gangrene: Secondary | ICD-10-CM | POA: Diagnosis not present

## 2019-12-29 DIAGNOSIS — I4891 Unspecified atrial fibrillation: Secondary | ICD-10-CM | POA: Diagnosis not present

## 2019-12-29 DIAGNOSIS — F209 Schizophrenia, unspecified: Secondary | ICD-10-CM | POA: Diagnosis not present

## 2019-12-29 DIAGNOSIS — R296 Repeated falls: Secondary | ICD-10-CM | POA: Diagnosis not present

## 2019-12-29 DIAGNOSIS — E1151 Type 2 diabetes mellitus with diabetic peripheral angiopathy without gangrene: Secondary | ICD-10-CM | POA: Diagnosis not present

## 2019-12-29 DIAGNOSIS — H6690 Otitis media, unspecified, unspecified ear: Secondary | ICD-10-CM | POA: Diagnosis not present

## 2019-12-29 DIAGNOSIS — I69354 Hemiplegia and hemiparesis following cerebral infarction affecting left non-dominant side: Secondary | ICD-10-CM | POA: Diagnosis not present

## 2019-12-30 DIAGNOSIS — Z20828 Contact with and (suspected) exposure to other viral communicable diseases: Secondary | ICD-10-CM | POA: Diagnosis not present

## 2019-12-30 DIAGNOSIS — F209 Schizophrenia, unspecified: Secondary | ICD-10-CM | POA: Diagnosis not present

## 2019-12-30 DIAGNOSIS — H6690 Otitis media, unspecified, unspecified ear: Secondary | ICD-10-CM | POA: Diagnosis not present

## 2019-12-30 DIAGNOSIS — U071 COVID-19: Secondary | ICD-10-CM | POA: Diagnosis not present

## 2019-12-30 DIAGNOSIS — E1151 Type 2 diabetes mellitus with diabetic peripheral angiopathy without gangrene: Secondary | ICD-10-CM | POA: Diagnosis not present

## 2019-12-30 DIAGNOSIS — I4891 Unspecified atrial fibrillation: Secondary | ICD-10-CM | POA: Diagnosis not present

## 2019-12-30 DIAGNOSIS — I69354 Hemiplegia and hemiparesis following cerebral infarction affecting left non-dominant side: Secondary | ICD-10-CM | POA: Diagnosis not present

## 2019-12-30 DIAGNOSIS — R296 Repeated falls: Secondary | ICD-10-CM | POA: Diagnosis not present

## 2019-12-31 ENCOUNTER — Other Ambulatory Visit: Payer: Self-pay

## 2019-12-31 ENCOUNTER — Telehealth (INDEPENDENT_AMBULATORY_CARE_PROVIDER_SITE_OTHER): Payer: Medicare Other | Admitting: Psychiatry

## 2019-12-31 ENCOUNTER — Telehealth (HOSPITAL_COMMUNITY): Payer: Medicare Other | Admitting: Psychiatry

## 2019-12-31 ENCOUNTER — Encounter: Payer: Self-pay | Admitting: Psychiatry

## 2019-12-31 DIAGNOSIS — F209 Schizophrenia, unspecified: Secondary | ICD-10-CM

## 2019-12-31 DIAGNOSIS — F3341 Major depressive disorder, recurrent, in partial remission: Secondary | ICD-10-CM

## 2019-12-31 MED ORDER — RISPERIDONE 1 MG PO TABS: 1.0000 mg | ORAL_TABLET | Freq: Two times a day (BID) | ORAL | 3 refills | Status: DC

## 2019-12-31 MED ORDER — ABILIFY 2 MG PO TABS: 2.0000 mg | ORAL_TABLET | Freq: Every day | ORAL | 3 refills | Status: DC

## 2019-12-31 NOTE — Patient Instructions (Signed)
1. ContinueAbilify2 mg daily 2. ContinueRisperidone 1 mgtwice a day  3.Next appointment:  3/9 at 9:30

## 2020-01-01 DIAGNOSIS — Z20828 Contact with and (suspected) exposure to other viral communicable diseases: Secondary | ICD-10-CM | POA: Diagnosis not present

## 2020-01-01 DIAGNOSIS — I69354 Hemiplegia and hemiparesis following cerebral infarction affecting left non-dominant side: Secondary | ICD-10-CM | POA: Diagnosis not present

## 2020-01-01 DIAGNOSIS — H6690 Otitis media, unspecified, unspecified ear: Secondary | ICD-10-CM | POA: Diagnosis not present

## 2020-01-01 DIAGNOSIS — F209 Schizophrenia, unspecified: Secondary | ICD-10-CM | POA: Diagnosis not present

## 2020-01-01 DIAGNOSIS — U071 COVID-19: Secondary | ICD-10-CM | POA: Diagnosis not present

## 2020-01-01 DIAGNOSIS — I4891 Unspecified atrial fibrillation: Secondary | ICD-10-CM | POA: Diagnosis not present

## 2020-01-01 DIAGNOSIS — R296 Repeated falls: Secondary | ICD-10-CM | POA: Diagnosis not present

## 2020-01-01 DIAGNOSIS — E1151 Type 2 diabetes mellitus with diabetic peripheral angiopathy without gangrene: Secondary | ICD-10-CM | POA: Diagnosis not present

## 2020-01-05 DIAGNOSIS — H6690 Otitis media, unspecified, unspecified ear: Secondary | ICD-10-CM | POA: Diagnosis not present

## 2020-01-05 DIAGNOSIS — I69354 Hemiplegia and hemiparesis following cerebral infarction affecting left non-dominant side: Secondary | ICD-10-CM | POA: Diagnosis not present

## 2020-01-05 DIAGNOSIS — I4891 Unspecified atrial fibrillation: Secondary | ICD-10-CM | POA: Diagnosis not present

## 2020-01-05 DIAGNOSIS — E1151 Type 2 diabetes mellitus with diabetic peripheral angiopathy without gangrene: Secondary | ICD-10-CM | POA: Diagnosis not present

## 2020-01-05 DIAGNOSIS — F209 Schizophrenia, unspecified: Secondary | ICD-10-CM | POA: Diagnosis not present

## 2020-01-05 DIAGNOSIS — R296 Repeated falls: Secondary | ICD-10-CM | POA: Diagnosis not present

## 2020-01-06 DIAGNOSIS — R296 Repeated falls: Secondary | ICD-10-CM | POA: Diagnosis not present

## 2020-01-06 DIAGNOSIS — H6690 Otitis media, unspecified, unspecified ear: Secondary | ICD-10-CM | POA: Diagnosis not present

## 2020-01-06 DIAGNOSIS — U071 COVID-19: Secondary | ICD-10-CM | POA: Diagnosis not present

## 2020-01-06 DIAGNOSIS — B974 Respiratory syncytial virus as the cause of diseases classified elsewhere: Secondary | ICD-10-CM | POA: Diagnosis not present

## 2020-01-06 DIAGNOSIS — I69354 Hemiplegia and hemiparesis following cerebral infarction affecting left non-dominant side: Secondary | ICD-10-CM | POA: Diagnosis not present

## 2020-01-06 DIAGNOSIS — F209 Schizophrenia, unspecified: Secondary | ICD-10-CM | POA: Diagnosis not present

## 2020-01-06 DIAGNOSIS — J101 Influenza due to other identified influenza virus with other respiratory manifestations: Secondary | ICD-10-CM | POA: Diagnosis not present

## 2020-01-06 DIAGNOSIS — E1151 Type 2 diabetes mellitus with diabetic peripheral angiopathy without gangrene: Secondary | ICD-10-CM | POA: Diagnosis not present

## 2020-01-06 DIAGNOSIS — I4891 Unspecified atrial fibrillation: Secondary | ICD-10-CM | POA: Diagnosis not present

## 2020-01-06 DIAGNOSIS — Z20828 Contact with and (suspected) exposure to other viral communicable diseases: Secondary | ICD-10-CM | POA: Diagnosis not present

## 2020-01-07 DIAGNOSIS — I4891 Unspecified atrial fibrillation: Secondary | ICD-10-CM | POA: Diagnosis not present

## 2020-01-07 DIAGNOSIS — R296 Repeated falls: Secondary | ICD-10-CM | POA: Diagnosis not present

## 2020-01-07 DIAGNOSIS — F209 Schizophrenia, unspecified: Secondary | ICD-10-CM | POA: Diagnosis not present

## 2020-01-07 DIAGNOSIS — E1151 Type 2 diabetes mellitus with diabetic peripheral angiopathy without gangrene: Secondary | ICD-10-CM | POA: Diagnosis not present

## 2020-01-07 DIAGNOSIS — H6690 Otitis media, unspecified, unspecified ear: Secondary | ICD-10-CM | POA: Diagnosis not present

## 2020-01-07 DIAGNOSIS — I69354 Hemiplegia and hemiparesis following cerebral infarction affecting left non-dominant side: Secondary | ICD-10-CM | POA: Diagnosis not present

## 2020-01-08 DIAGNOSIS — U071 COVID-19: Secondary | ICD-10-CM | POA: Diagnosis not present

## 2020-01-08 DIAGNOSIS — R296 Repeated falls: Secondary | ICD-10-CM | POA: Diagnosis not present

## 2020-01-08 DIAGNOSIS — I69354 Hemiplegia and hemiparesis following cerebral infarction affecting left non-dominant side: Secondary | ICD-10-CM | POA: Diagnosis not present

## 2020-01-08 DIAGNOSIS — E1151 Type 2 diabetes mellitus with diabetic peripheral angiopathy without gangrene: Secondary | ICD-10-CM | POA: Diagnosis not present

## 2020-01-08 DIAGNOSIS — I4891 Unspecified atrial fibrillation: Secondary | ICD-10-CM | POA: Diagnosis not present

## 2020-01-08 DIAGNOSIS — Z20828 Contact with and (suspected) exposure to other viral communicable diseases: Secondary | ICD-10-CM | POA: Diagnosis not present

## 2020-01-08 DIAGNOSIS — H6690 Otitis media, unspecified, unspecified ear: Secondary | ICD-10-CM | POA: Diagnosis not present

## 2020-01-08 DIAGNOSIS — F209 Schizophrenia, unspecified: Secondary | ICD-10-CM | POA: Diagnosis not present

## 2020-01-11 DIAGNOSIS — F209 Schizophrenia, unspecified: Secondary | ICD-10-CM | POA: Diagnosis not present

## 2020-01-11 DIAGNOSIS — E1151 Type 2 diabetes mellitus with diabetic peripheral angiopathy without gangrene: Secondary | ICD-10-CM | POA: Diagnosis not present

## 2020-01-11 DIAGNOSIS — I4891 Unspecified atrial fibrillation: Secondary | ICD-10-CM | POA: Diagnosis not present

## 2020-01-11 DIAGNOSIS — I69354 Hemiplegia and hemiparesis following cerebral infarction affecting left non-dominant side: Secondary | ICD-10-CM | POA: Diagnosis not present

## 2020-01-11 DIAGNOSIS — R296 Repeated falls: Secondary | ICD-10-CM | POA: Diagnosis not present

## 2020-01-11 DIAGNOSIS — H6690 Otitis media, unspecified, unspecified ear: Secondary | ICD-10-CM | POA: Diagnosis not present

## 2020-01-13 DIAGNOSIS — F209 Schizophrenia, unspecified: Secondary | ICD-10-CM | POA: Diagnosis not present

## 2020-01-13 DIAGNOSIS — H6690 Otitis media, unspecified, unspecified ear: Secondary | ICD-10-CM | POA: Diagnosis not present

## 2020-01-13 DIAGNOSIS — I4891 Unspecified atrial fibrillation: Secondary | ICD-10-CM | POA: Diagnosis not present

## 2020-01-13 DIAGNOSIS — I69354 Hemiplegia and hemiparesis following cerebral infarction affecting left non-dominant side: Secondary | ICD-10-CM | POA: Diagnosis not present

## 2020-01-13 DIAGNOSIS — U071 COVID-19: Secondary | ICD-10-CM | POA: Diagnosis not present

## 2020-01-13 DIAGNOSIS — E1151 Type 2 diabetes mellitus with diabetic peripheral angiopathy without gangrene: Secondary | ICD-10-CM | POA: Diagnosis not present

## 2020-01-13 DIAGNOSIS — R296 Repeated falls: Secondary | ICD-10-CM | POA: Diagnosis not present

## 2020-01-13 DIAGNOSIS — Z20828 Contact with and (suspected) exposure to other viral communicable diseases: Secondary | ICD-10-CM | POA: Diagnosis not present

## 2020-01-14 DIAGNOSIS — I69354 Hemiplegia and hemiparesis following cerebral infarction affecting left non-dominant side: Secondary | ICD-10-CM | POA: Diagnosis not present

## 2020-01-14 DIAGNOSIS — I4891 Unspecified atrial fibrillation: Secondary | ICD-10-CM | POA: Diagnosis not present

## 2020-01-14 DIAGNOSIS — R296 Repeated falls: Secondary | ICD-10-CM | POA: Diagnosis not present

## 2020-01-14 DIAGNOSIS — F209 Schizophrenia, unspecified: Secondary | ICD-10-CM | POA: Diagnosis not present

## 2020-01-14 DIAGNOSIS — H6690 Otitis media, unspecified, unspecified ear: Secondary | ICD-10-CM | POA: Diagnosis not present

## 2020-01-14 DIAGNOSIS — E1151 Type 2 diabetes mellitus with diabetic peripheral angiopathy without gangrene: Secondary | ICD-10-CM | POA: Diagnosis not present

## 2020-01-15 DIAGNOSIS — U071 COVID-19: Secondary | ICD-10-CM | POA: Diagnosis not present

## 2020-01-15 DIAGNOSIS — Z20828 Contact with and (suspected) exposure to other viral communicable diseases: Secondary | ICD-10-CM | POA: Diagnosis not present

## 2020-01-16 DIAGNOSIS — E1142 Type 2 diabetes mellitus with diabetic polyneuropathy: Secondary | ICD-10-CM | POA: Diagnosis not present

## 2020-01-16 DIAGNOSIS — I10 Essential (primary) hypertension: Secondary | ICD-10-CM | POA: Diagnosis not present

## 2020-01-20 DIAGNOSIS — Z20828 Contact with and (suspected) exposure to other viral communicable diseases: Secondary | ICD-10-CM | POA: Diagnosis not present

## 2020-01-20 DIAGNOSIS — U071 COVID-19: Secondary | ICD-10-CM | POA: Diagnosis not present

## 2020-01-21 ENCOUNTER — Other Ambulatory Visit: Payer: Self-pay

## 2020-01-21 ENCOUNTER — Ambulatory Visit (INDEPENDENT_AMBULATORY_CARE_PROVIDER_SITE_OTHER): Payer: Medicare Other | Admitting: Pharmacist

## 2020-01-21 ENCOUNTER — Other Ambulatory Visit: Payer: Self-pay | Admitting: "Endocrinology

## 2020-01-21 DIAGNOSIS — I4891 Unspecified atrial fibrillation: Secondary | ICD-10-CM | POA: Diagnosis not present

## 2020-01-21 DIAGNOSIS — I82409 Acute embolism and thrombosis of unspecified deep veins of unspecified lower extremity: Secondary | ICD-10-CM

## 2020-01-21 DIAGNOSIS — Z8679 Personal history of other diseases of the circulatory system: Secondary | ICD-10-CM

## 2020-01-21 DIAGNOSIS — Z5181 Encounter for therapeutic drug level monitoring: Secondary | ICD-10-CM | POA: Diagnosis not present

## 2020-01-21 LAB — POCT INR: INR: 3.1 — AB (ref 2.0–3.0)

## 2020-01-21 NOTE — Patient Instructions (Addendum)
Description   Hold dose today and then continue warfarin 5mg  daily except 2.5mg  on Mondays  Recheck in 4 weeks Orders written for [facility] [phone]

## 2020-01-22 DIAGNOSIS — Z7901 Long term (current) use of anticoagulants: Secondary | ICD-10-CM | POA: Diagnosis not present

## 2020-01-22 DIAGNOSIS — Z1389 Encounter for screening for other disorder: Secondary | ICD-10-CM | POA: Diagnosis not present

## 2020-01-22 DIAGNOSIS — F209 Schizophrenia, unspecified: Secondary | ICD-10-CM | POA: Diagnosis not present

## 2020-01-22 DIAGNOSIS — Z7984 Long term (current) use of oral hypoglycemic drugs: Secondary | ICD-10-CM | POA: Diagnosis not present

## 2020-01-22 DIAGNOSIS — R296 Repeated falls: Secondary | ICD-10-CM | POA: Diagnosis not present

## 2020-01-22 DIAGNOSIS — I4891 Unspecified atrial fibrillation: Secondary | ICD-10-CM | POA: Diagnosis not present

## 2020-01-22 DIAGNOSIS — H6690 Otitis media, unspecified, unspecified ear: Secondary | ICD-10-CM | POA: Diagnosis not present

## 2020-01-22 DIAGNOSIS — E1142 Type 2 diabetes mellitus with diabetic polyneuropathy: Secondary | ICD-10-CM | POA: Diagnosis not present

## 2020-01-22 DIAGNOSIS — E1151 Type 2 diabetes mellitus with diabetic peripheral angiopathy without gangrene: Secondary | ICD-10-CM | POA: Diagnosis not present

## 2020-01-22 DIAGNOSIS — Z20828 Contact with and (suspected) exposure to other viral communicable diseases: Secondary | ICD-10-CM | POA: Diagnosis not present

## 2020-01-22 DIAGNOSIS — I825Y9 Chronic embolism and thrombosis of unspecified deep veins of unspecified proximal lower extremity: Secondary | ICD-10-CM | POA: Diagnosis not present

## 2020-01-22 DIAGNOSIS — U071 COVID-19: Secondary | ICD-10-CM | POA: Diagnosis not present

## 2020-01-22 DIAGNOSIS — M21372 Foot drop, left foot: Secondary | ICD-10-CM | POA: Diagnosis not present

## 2020-01-22 DIAGNOSIS — Z1331 Encounter for screening for depression: Secondary | ICD-10-CM | POA: Diagnosis not present

## 2020-01-22 DIAGNOSIS — G8190 Hemiplegia, unspecified affecting unspecified side: Secondary | ICD-10-CM | POA: Diagnosis not present

## 2020-01-22 DIAGNOSIS — I69354 Hemiplegia and hemiparesis following cerebral infarction affecting left non-dominant side: Secondary | ICD-10-CM | POA: Diagnosis not present

## 2020-01-22 DIAGNOSIS — Z794 Long term (current) use of insulin: Secondary | ICD-10-CM | POA: Diagnosis not present

## 2020-01-22 DIAGNOSIS — I10 Essential (primary) hypertension: Secondary | ICD-10-CM | POA: Diagnosis not present

## 2020-01-26 DIAGNOSIS — I69354 Hemiplegia and hemiparesis following cerebral infarction affecting left non-dominant side: Secondary | ICD-10-CM | POA: Diagnosis not present

## 2020-01-26 DIAGNOSIS — R296 Repeated falls: Secondary | ICD-10-CM | POA: Diagnosis not present

## 2020-01-26 DIAGNOSIS — I4891 Unspecified atrial fibrillation: Secondary | ICD-10-CM | POA: Diagnosis not present

## 2020-01-26 DIAGNOSIS — F209 Schizophrenia, unspecified: Secondary | ICD-10-CM | POA: Diagnosis not present

## 2020-01-26 DIAGNOSIS — H6690 Otitis media, unspecified, unspecified ear: Secondary | ICD-10-CM | POA: Diagnosis not present

## 2020-01-26 DIAGNOSIS — E1151 Type 2 diabetes mellitus with diabetic peripheral angiopathy without gangrene: Secondary | ICD-10-CM | POA: Diagnosis not present

## 2020-01-27 DIAGNOSIS — Z20828 Contact with and (suspected) exposure to other viral communicable diseases: Secondary | ICD-10-CM | POA: Diagnosis not present

## 2020-01-27 DIAGNOSIS — I69354 Hemiplegia and hemiparesis following cerebral infarction affecting left non-dominant side: Secondary | ICD-10-CM | POA: Diagnosis not present

## 2020-01-27 DIAGNOSIS — F209 Schizophrenia, unspecified: Secondary | ICD-10-CM | POA: Diagnosis not present

## 2020-01-27 DIAGNOSIS — R296 Repeated falls: Secondary | ICD-10-CM | POA: Diagnosis not present

## 2020-01-27 DIAGNOSIS — H6690 Otitis media, unspecified, unspecified ear: Secondary | ICD-10-CM | POA: Diagnosis not present

## 2020-01-27 DIAGNOSIS — E1151 Type 2 diabetes mellitus with diabetic peripheral angiopathy without gangrene: Secondary | ICD-10-CM | POA: Diagnosis not present

## 2020-01-27 DIAGNOSIS — U071 COVID-19: Secondary | ICD-10-CM | POA: Diagnosis not present

## 2020-01-27 DIAGNOSIS — I4891 Unspecified atrial fibrillation: Secondary | ICD-10-CM | POA: Diagnosis not present

## 2020-01-28 DIAGNOSIS — E1151 Type 2 diabetes mellitus with diabetic peripheral angiopathy without gangrene: Secondary | ICD-10-CM | POA: Diagnosis not present

## 2020-01-28 DIAGNOSIS — F209 Schizophrenia, unspecified: Secondary | ICD-10-CM | POA: Diagnosis not present

## 2020-01-28 DIAGNOSIS — I4891 Unspecified atrial fibrillation: Secondary | ICD-10-CM | POA: Diagnosis not present

## 2020-01-28 DIAGNOSIS — R296 Repeated falls: Secondary | ICD-10-CM | POA: Diagnosis not present

## 2020-01-28 DIAGNOSIS — I69354 Hemiplegia and hemiparesis following cerebral infarction affecting left non-dominant side: Secondary | ICD-10-CM | POA: Diagnosis not present

## 2020-01-28 DIAGNOSIS — H6690 Otitis media, unspecified, unspecified ear: Secondary | ICD-10-CM | POA: Diagnosis not present

## 2020-01-29 DIAGNOSIS — R296 Repeated falls: Secondary | ICD-10-CM | POA: Diagnosis not present

## 2020-01-29 DIAGNOSIS — F209 Schizophrenia, unspecified: Secondary | ICD-10-CM | POA: Diagnosis not present

## 2020-01-29 DIAGNOSIS — I4891 Unspecified atrial fibrillation: Secondary | ICD-10-CM | POA: Diagnosis not present

## 2020-01-29 DIAGNOSIS — Z20828 Contact with and (suspected) exposure to other viral communicable diseases: Secondary | ICD-10-CM | POA: Diagnosis not present

## 2020-01-29 DIAGNOSIS — H6690 Otitis media, unspecified, unspecified ear: Secondary | ICD-10-CM | POA: Diagnosis not present

## 2020-01-29 DIAGNOSIS — U071 COVID-19: Secondary | ICD-10-CM | POA: Diagnosis not present

## 2020-01-29 DIAGNOSIS — E1151 Type 2 diabetes mellitus with diabetic peripheral angiopathy without gangrene: Secondary | ICD-10-CM | POA: Diagnosis not present

## 2020-01-29 DIAGNOSIS — I69354 Hemiplegia and hemiparesis following cerebral infarction affecting left non-dominant side: Secondary | ICD-10-CM | POA: Diagnosis not present

## 2020-02-02 DIAGNOSIS — I69354 Hemiplegia and hemiparesis following cerebral infarction affecting left non-dominant side: Secondary | ICD-10-CM | POA: Diagnosis not present

## 2020-02-02 DIAGNOSIS — H6690 Otitis media, unspecified, unspecified ear: Secondary | ICD-10-CM | POA: Diagnosis not present

## 2020-02-02 DIAGNOSIS — I4891 Unspecified atrial fibrillation: Secondary | ICD-10-CM | POA: Diagnosis not present

## 2020-02-02 DIAGNOSIS — E1151 Type 2 diabetes mellitus with diabetic peripheral angiopathy without gangrene: Secondary | ICD-10-CM | POA: Diagnosis not present

## 2020-02-02 DIAGNOSIS — R296 Repeated falls: Secondary | ICD-10-CM | POA: Diagnosis not present

## 2020-02-02 DIAGNOSIS — F209 Schizophrenia, unspecified: Secondary | ICD-10-CM | POA: Diagnosis not present

## 2020-02-03 DIAGNOSIS — U071 COVID-19: Secondary | ICD-10-CM | POA: Diagnosis not present

## 2020-02-03 DIAGNOSIS — E1151 Type 2 diabetes mellitus with diabetic peripheral angiopathy without gangrene: Secondary | ICD-10-CM | POA: Diagnosis not present

## 2020-02-03 DIAGNOSIS — I4891 Unspecified atrial fibrillation: Secondary | ICD-10-CM | POA: Diagnosis not present

## 2020-02-03 DIAGNOSIS — R296 Repeated falls: Secondary | ICD-10-CM | POA: Diagnosis not present

## 2020-02-03 DIAGNOSIS — H6690 Otitis media, unspecified, unspecified ear: Secondary | ICD-10-CM | POA: Diagnosis not present

## 2020-02-03 DIAGNOSIS — F209 Schizophrenia, unspecified: Secondary | ICD-10-CM | POA: Diagnosis not present

## 2020-02-03 DIAGNOSIS — I69354 Hemiplegia and hemiparesis following cerebral infarction affecting left non-dominant side: Secondary | ICD-10-CM | POA: Diagnosis not present

## 2020-02-03 DIAGNOSIS — Z20828 Contact with and (suspected) exposure to other viral communicable diseases: Secondary | ICD-10-CM | POA: Diagnosis not present

## 2020-02-04 DIAGNOSIS — I69354 Hemiplegia and hemiparesis following cerebral infarction affecting left non-dominant side: Secondary | ICD-10-CM | POA: Diagnosis not present

## 2020-02-04 DIAGNOSIS — R296 Repeated falls: Secondary | ICD-10-CM | POA: Diagnosis not present

## 2020-02-04 DIAGNOSIS — H6690 Otitis media, unspecified, unspecified ear: Secondary | ICD-10-CM | POA: Diagnosis not present

## 2020-02-04 DIAGNOSIS — F209 Schizophrenia, unspecified: Secondary | ICD-10-CM | POA: Diagnosis not present

## 2020-02-04 DIAGNOSIS — E1151 Type 2 diabetes mellitus with diabetic peripheral angiopathy without gangrene: Secondary | ICD-10-CM | POA: Diagnosis not present

## 2020-02-04 DIAGNOSIS — I4891 Unspecified atrial fibrillation: Secondary | ICD-10-CM | POA: Diagnosis not present

## 2020-02-05 DIAGNOSIS — U071 COVID-19: Secondary | ICD-10-CM | POA: Diagnosis not present

## 2020-02-05 DIAGNOSIS — F209 Schizophrenia, unspecified: Secondary | ICD-10-CM | POA: Diagnosis not present

## 2020-02-05 DIAGNOSIS — Z20828 Contact with and (suspected) exposure to other viral communicable diseases: Secondary | ICD-10-CM | POA: Diagnosis not present

## 2020-02-05 DIAGNOSIS — I4891 Unspecified atrial fibrillation: Secondary | ICD-10-CM | POA: Diagnosis not present

## 2020-02-05 DIAGNOSIS — R296 Repeated falls: Secondary | ICD-10-CM | POA: Diagnosis not present

## 2020-02-05 DIAGNOSIS — I69354 Hemiplegia and hemiparesis following cerebral infarction affecting left non-dominant side: Secondary | ICD-10-CM | POA: Diagnosis not present

## 2020-02-05 DIAGNOSIS — E1151 Type 2 diabetes mellitus with diabetic peripheral angiopathy without gangrene: Secondary | ICD-10-CM | POA: Diagnosis not present

## 2020-02-05 DIAGNOSIS — H6690 Otitis media, unspecified, unspecified ear: Secondary | ICD-10-CM | POA: Diagnosis not present

## 2020-02-09 ENCOUNTER — Other Ambulatory Visit: Payer: Self-pay | Admitting: Family Medicine

## 2020-02-09 ENCOUNTER — Other Ambulatory Visit: Payer: Self-pay | Admitting: "Endocrinology

## 2020-02-10 DIAGNOSIS — Z20828 Contact with and (suspected) exposure to other viral communicable diseases: Secondary | ICD-10-CM | POA: Diagnosis not present

## 2020-02-10 DIAGNOSIS — I69354 Hemiplegia and hemiparesis following cerebral infarction affecting left non-dominant side: Secondary | ICD-10-CM | POA: Diagnosis not present

## 2020-02-10 DIAGNOSIS — R296 Repeated falls: Secondary | ICD-10-CM | POA: Diagnosis not present

## 2020-02-10 DIAGNOSIS — E1151 Type 2 diabetes mellitus with diabetic peripheral angiopathy without gangrene: Secondary | ICD-10-CM | POA: Diagnosis not present

## 2020-02-10 DIAGNOSIS — F209 Schizophrenia, unspecified: Secondary | ICD-10-CM | POA: Diagnosis not present

## 2020-02-10 DIAGNOSIS — I4891 Unspecified atrial fibrillation: Secondary | ICD-10-CM | POA: Diagnosis not present

## 2020-02-10 DIAGNOSIS — U071 COVID-19: Secondary | ICD-10-CM | POA: Diagnosis not present

## 2020-02-10 DIAGNOSIS — H6690 Otitis media, unspecified, unspecified ear: Secondary | ICD-10-CM | POA: Diagnosis not present

## 2020-02-11 DIAGNOSIS — R296 Repeated falls: Secondary | ICD-10-CM | POA: Diagnosis not present

## 2020-02-11 DIAGNOSIS — I69354 Hemiplegia and hemiparesis following cerebral infarction affecting left non-dominant side: Secondary | ICD-10-CM | POA: Diagnosis not present

## 2020-02-11 DIAGNOSIS — F209 Schizophrenia, unspecified: Secondary | ICD-10-CM | POA: Diagnosis not present

## 2020-02-11 DIAGNOSIS — I4891 Unspecified atrial fibrillation: Secondary | ICD-10-CM | POA: Diagnosis not present

## 2020-02-11 DIAGNOSIS — E1151 Type 2 diabetes mellitus with diabetic peripheral angiopathy without gangrene: Secondary | ICD-10-CM | POA: Diagnosis not present

## 2020-02-11 DIAGNOSIS — H6690 Otitis media, unspecified, unspecified ear: Secondary | ICD-10-CM | POA: Diagnosis not present

## 2020-02-12 DIAGNOSIS — Z20828 Contact with and (suspected) exposure to other viral communicable diseases: Secondary | ICD-10-CM | POA: Diagnosis not present

## 2020-02-12 DIAGNOSIS — U071 COVID-19: Secondary | ICD-10-CM | POA: Diagnosis not present

## 2020-02-13 DIAGNOSIS — U071 COVID-19: Secondary | ICD-10-CM | POA: Diagnosis not present

## 2020-02-13 DIAGNOSIS — Z20828 Contact with and (suspected) exposure to other viral communicable diseases: Secondary | ICD-10-CM | POA: Diagnosis not present

## 2020-02-16 DIAGNOSIS — R296 Repeated falls: Secondary | ICD-10-CM | POA: Diagnosis not present

## 2020-02-16 DIAGNOSIS — I4891 Unspecified atrial fibrillation: Secondary | ICD-10-CM | POA: Diagnosis not present

## 2020-02-16 DIAGNOSIS — E1151 Type 2 diabetes mellitus with diabetic peripheral angiopathy without gangrene: Secondary | ICD-10-CM | POA: Diagnosis not present

## 2020-02-16 DIAGNOSIS — F209 Schizophrenia, unspecified: Secondary | ICD-10-CM | POA: Diagnosis not present

## 2020-02-16 DIAGNOSIS — H6690 Otitis media, unspecified, unspecified ear: Secondary | ICD-10-CM | POA: Diagnosis not present

## 2020-02-16 DIAGNOSIS — I69354 Hemiplegia and hemiparesis following cerebral infarction affecting left non-dominant side: Secondary | ICD-10-CM | POA: Diagnosis not present

## 2020-02-17 DIAGNOSIS — U071 COVID-19: Secondary | ICD-10-CM | POA: Diagnosis not present

## 2020-02-17 DIAGNOSIS — Z20828 Contact with and (suspected) exposure to other viral communicable diseases: Secondary | ICD-10-CM | POA: Diagnosis not present

## 2020-02-18 ENCOUNTER — Other Ambulatory Visit: Payer: Self-pay

## 2020-02-18 ENCOUNTER — Ambulatory Visit (INDEPENDENT_AMBULATORY_CARE_PROVIDER_SITE_OTHER): Payer: Medicare Other | Admitting: *Deleted

## 2020-02-18 DIAGNOSIS — Z5181 Encounter for therapeutic drug level monitoring: Secondary | ICD-10-CM | POA: Diagnosis not present

## 2020-02-18 DIAGNOSIS — Z8679 Personal history of other diseases of the circulatory system: Secondary | ICD-10-CM | POA: Diagnosis not present

## 2020-02-18 LAB — POCT INR: INR: 2.2 (ref 2.0–3.0)

## 2020-02-18 NOTE — Patient Instructions (Signed)
Continue warfarin 5mg daily except 2.5mg on Mondays  Recheck in 4 weeks Orders written for [facility] [phone] 

## 2020-02-19 DIAGNOSIS — Z20828 Contact with and (suspected) exposure to other viral communicable diseases: Secondary | ICD-10-CM | POA: Diagnosis not present

## 2020-02-19 DIAGNOSIS — I4891 Unspecified atrial fibrillation: Secondary | ICD-10-CM | POA: Diagnosis not present

## 2020-02-19 DIAGNOSIS — F209 Schizophrenia, unspecified: Secondary | ICD-10-CM | POA: Diagnosis not present

## 2020-02-19 DIAGNOSIS — H6690 Otitis media, unspecified, unspecified ear: Secondary | ICD-10-CM | POA: Diagnosis not present

## 2020-02-19 DIAGNOSIS — E1151 Type 2 diabetes mellitus with diabetic peripheral angiopathy without gangrene: Secondary | ICD-10-CM | POA: Diagnosis not present

## 2020-02-19 DIAGNOSIS — R296 Repeated falls: Secondary | ICD-10-CM | POA: Diagnosis not present

## 2020-02-19 DIAGNOSIS — U071 COVID-19: Secondary | ICD-10-CM | POA: Diagnosis not present

## 2020-02-19 DIAGNOSIS — I69354 Hemiplegia and hemiparesis following cerebral infarction affecting left non-dominant side: Secondary | ICD-10-CM | POA: Diagnosis not present

## 2020-02-20 ENCOUNTER — Emergency Department (HOSPITAL_COMMUNITY): Payer: Medicare Other

## 2020-02-20 ENCOUNTER — Observation Stay (HOSPITAL_COMMUNITY)
Admission: EM | Admit: 2020-02-20 | Discharge: 2020-02-21 | Disposition: A | Discharge status: Home / Self Care | Payer: Medicare Other | Attending: Emergency Medicine | Admitting: Emergency Medicine

## 2020-02-20 DIAGNOSIS — F209 Schizophrenia, unspecified: Secondary | ICD-10-CM | POA: Diagnosis not present

## 2020-02-20 DIAGNOSIS — E1151 Type 2 diabetes mellitus with diabetic peripheral angiopathy without gangrene: Secondary | ICD-10-CM | POA: Diagnosis not present

## 2020-02-20 DIAGNOSIS — Z8616 Personal history of COVID-19: Secondary | ICD-10-CM | POA: Insufficient documentation

## 2020-02-20 DIAGNOSIS — R634 Abnormal weight loss: Secondary | ICD-10-CM

## 2020-02-20 DIAGNOSIS — R0789 Other chest pain: Secondary | ICD-10-CM | POA: Diagnosis not present

## 2020-02-20 DIAGNOSIS — I69354 Hemiplegia and hemiparesis following cerebral infarction affecting left non-dominant side: Secondary | ICD-10-CM | POA: Diagnosis not present

## 2020-02-20 DIAGNOSIS — Z794 Long term (current) use of insulin: Secondary | ICD-10-CM | POA: Diagnosis not present

## 2020-02-20 DIAGNOSIS — Z87891 Personal history of nicotine dependence: Secondary | ICD-10-CM | POA: Insufficient documentation

## 2020-02-20 DIAGNOSIS — I213 ST elevation (STEMI) myocardial infarction of unspecified site: Secondary | ICD-10-CM | POA: Diagnosis not present

## 2020-02-20 DIAGNOSIS — Z7901 Long term (current) use of anticoagulants: Secondary | ICD-10-CM | POA: Diagnosis not present

## 2020-02-20 DIAGNOSIS — R296 Repeated falls: Secondary | ICD-10-CM | POA: Diagnosis not present

## 2020-02-20 DIAGNOSIS — I4891 Unspecified atrial fibrillation: Secondary | ICD-10-CM | POA: Diagnosis not present

## 2020-02-20 DIAGNOSIS — K582 Mixed irritable bowel syndrome: Secondary | ICD-10-CM

## 2020-02-20 DIAGNOSIS — Z79899 Other long term (current) drug therapy: Secondary | ICD-10-CM | POA: Diagnosis not present

## 2020-02-20 DIAGNOSIS — I10 Essential (primary) hypertension: Secondary | ICD-10-CM | POA: Diagnosis not present

## 2020-02-20 DIAGNOSIS — R109 Unspecified abdominal pain: Secondary | ICD-10-CM

## 2020-02-20 DIAGNOSIS — Z20822 Contact with and (suspected) exposure to covid-19: Secondary | ICD-10-CM | POA: Insufficient documentation

## 2020-02-20 DIAGNOSIS — D500 Iron deficiency anemia secondary to blood loss (chronic): Secondary | ICD-10-CM

## 2020-02-20 DIAGNOSIS — R079 Chest pain, unspecified: Secondary | ICD-10-CM

## 2020-02-20 DIAGNOSIS — E119 Type 2 diabetes mellitus without complications: Secondary | ICD-10-CM | POA: Insufficient documentation

## 2020-02-20 DIAGNOSIS — F0390 Unspecified dementia without behavioral disturbance: Secondary | ICD-10-CM | POA: Insufficient documentation

## 2020-02-20 DIAGNOSIS — K219 Gastro-esophageal reflux disease without esophagitis: Secondary | ICD-10-CM

## 2020-02-20 DIAGNOSIS — R0602 Shortness of breath: Secondary | ICD-10-CM | POA: Diagnosis not present

## 2020-02-20 DIAGNOSIS — R0689 Other abnormalities of breathing: Secondary | ICD-10-CM | POA: Diagnosis not present

## 2020-02-20 DIAGNOSIS — H6690 Otitis media, unspecified, unspecified ear: Secondary | ICD-10-CM | POA: Diagnosis not present

## 2020-02-20 LAB — BASIC METABOLIC PANEL
Anion gap: 7 (ref 5–15)
BUN: 9 mg/dL (ref 8–23)
CO2: 27 mmol/L (ref 22–32)
Calcium: 7.4 mg/dL — ABNORMAL LOW (ref 8.9–10.3)
Chloride: 103 mmol/L (ref 98–111)
Creatinine, Ser: 0.61 mg/dL (ref 0.44–1.00)
GFR, Estimated: >60 mL/min (ref >60)
Glucose, Bld: 325 mg/dL — ABNORMAL HIGH (ref 70–99)
Potassium: 3.4 mmol/L — ABNORMAL LOW (ref 3.5–5.1)
Sodium: 137 mmol/L (ref 135–145)

## 2020-02-20 LAB — CBC
HCT: 30.9 % — ABNORMAL LOW (ref 36.0–46.0)
Hemoglobin: 10.7 g/dL — ABNORMAL LOW (ref 12.0–15.0)
MCH: 30.5 pg (ref 26.0–34.0)
MCHC: 34.6 g/dL (ref 30.0–36.0)
MCV: 88 fL (ref 80.0–100.0)
Platelets: 191 10*3/uL (ref 150–400)
RBC: 3.51 MIL/uL — ABNORMAL LOW (ref 3.87–5.11)
RDW: 12.5 % (ref 11.5–15.5)
WBC: 5.9 10*3/uL (ref 4.0–10.5)
nRBC: 0 % (ref 0.0–0.2)

## 2020-02-20 LAB — TROPONIN I (HIGH SENSITIVITY)
Troponin I (High Sensitivity): 10 ng/L (ref <18)
Troponin I (High Sensitivity): 12 ng/L (ref <18)

## 2020-02-20 LAB — SARS CORONAVIRUS 2 BY RT PCR (HOSPITAL ORDER, PERFORMED IN ~~LOC~~ HOSPITAL LAB): SARS Coronavirus 2: NEGATIVE

## 2020-02-20 MED ORDER — IOHEXOL 350 MG/ML SOLN
75.0000 mL | Freq: Once | INTRAVENOUS | Status: AC | PRN
  Administered 2020-02-20: 75 mL via INTRAVENOUS

## 2020-02-20 MED ORDER — FENTANYL CITRATE (PF) 100 MCG/2ML IJ SOLN
50.0000 ug | Freq: Once | INTRAMUSCULAR | Status: DC
Start: 2020-02-20 — End: 2020-02-21

## 2020-02-20 NOTE — H&P (Signed)
[facility] Admission History and Physical Service Pager: [phone]  Patient name: [name] Medical record number: [id number] Date of birth: [birth year] Age: 75 y.o. Gender: female  Primary Care Provider: [name], MD Consultants: Cardiology Code Status: Partial code Emergency contact: Loreli Slot [phone]  Chief Complaint: Chest pain  Assessment and Plan: [name] is a 75 y.o. female presenting with chest pain admitted for observation and possible stress test. PMH is significant for dementia, schizophrenia, chronic chest pain, uncontrolled type 2 diabetes, hypertension, hiatal hernia, and acid reflux.  Chest pain: Patient brought in from her long-term living facility with concerns for sharp left lower chest pain "in her left breast", which on report patient has had for the last 2-4 months. Site is also tender to palpation but without any specific finding to explain cause of pain on physical exam. Patient's case was discussed with cardiology Dr. Sallyanne Kuster due to concerns for slight ST segment elevation in leads II and III as well as possible flipped T waves in aVL, cardiology did not feel this qualified for STEMI.  EKG on admission appears about the same as previous admissions.  Troponins drawn and negative x2. Vital signs stable, patient slightly hypertensive but is also very anxious.  CBC showing hemoglobin slightly low at 10.7, patient has history of anemia for which she is prescribed an iron supplement.  Potassium 3.4. Chest x-ray performed was concerning for multifocal pneumonia, however CT angio of chest was negative for PE and also negative for any concerning signs of pneumonia.  Patient's last echocardiogram performed September 2021 revealed LVEF 60-65% concentric left ventricular hypertrophy was appreciated and grade 2 diastolic dysfunction.  Patient also had recent stress test performed September 2021, no ST segment deviations noted during  the stress test, patient was in and out of A. fib, could not it exclude very mild inferior ischemia, was overall low risk study.  Patient also with history of acid reflux and small hiatal hernia (seen on CT as far back as 2012). Cardiology felt EKG was more consistent with pericarditis-like picture, however current EKG unchanged from prior.  Feel patient's chest pain is not cardiac in nature. -Admit to med telemetry, Dr. Andria Frames attending -Consider reconsulting cardiology in the morning to weigh-in, might want to do stress test -Vital signs per routine -Continuous pulse ox, cardiac monitoring -PPI, Maalox, Amitiza, Senokot, MiraLAX to cover GI cause of chest pain -Continuous cardiac and pulse ox monitoring -Up with assistance  A. fib: On exam patient normal sinus rhythm.  Patient takes Cardizem 120 mg daily and metoprolol 25 mg daily for rate/rhythm control.  Patient also on warfarin, takes 2.5 mg Monday, 5 mg all the other days. -Continue Cardizem and metoprolol -Warfarin per pharmacy -Follow-up PT/INR  T2DM: Last HbA1c 10.6% in September 2021.  Current CBG 325.  Patient is currently taking Tradjenta 5 mg, Levemir 38 units daily. -Sensitive sign scale insulin, Levemir 19 units daily -Repeat A1c -CBG before every meal and at bedtime -Continue gabapentin as reviewed for diabetic neuropathy  GERD  GI Upset  Constipation: Patient with previous history of acid reflux and IBS for which she is prescribed AMitiza.  Reports lots of discomfort with gassiness.  Patient had a colonoscopy in 2016 in which 3 polyps were removed patient had tubular adenomas and internal hemorrhoids.  Was recommended she follow-up in 3 years.  At the same time patient had EGD which showed patent stricture at the GE junction, mild nonerosive gastritis.  Abdominal x-ray also revealing  stool burden, patient reveals she has not had a bowel movement in about 1 week. -PPI 40 mg twice daily -Senokot daily, MiraLAX daily -Continue  Amitiza 24 mcg twice daily with meals  Dyslipidemia: Patient's last lipid panel in September 2021 shows HDL slightly low at 40, otherwise within normal parameters.  Patient with ASCVD risk 42.5%.  Currently prescribed simvastatin 20 mg daily. -Discontinue simvastatin, initiate atorvastatin 40   Schizophrenia: Patient has a history of schizophrenia for which she takes Abilify 2 mg at bedtime, Klonopin 0.5 mg 3 times daily as needed for anxiety, Cymbalta 120 mg daily, and Risperidone 1 mg twice daily.  Patient also taking mirtazapine 30 mg at bedtime. -Continue home medications  -Abilify 2 mg at bedtime -Klonopin 0.5 mg 3 times daily as needed -Cymbalta 120 mg daily  -Risperidone 1 mg twice daily -Mirtazapine 30 mg at bedtime  Chronic pain: Patient with history of chronic pain for which she takes tizanidine 2 mg daily and Norco 10 mg 3 times daily.  Patient also prescribed gabapentin 300 mg 3 times daily and Cymbalta 120 mg daily. -Plan to continue tizanidine, Norco, gabapentin, and Cymbalta  QTC prolongation: Patient's QTC today prolonged at 451. -Try our best to avoid QTC prolongation medications   Chronic iron deficiency anemia: Patient's Hgb today 10.7, MCV 88.  Patient prescribed iron polysaccharide 150 mg daily -Continue iron polysaccharide  Dry eyes: Patient prescribed Liquifilm Tears as needed for dry and latanoprost daily at bedtime -Continue Liquifilm Tears and latanoprost daily  FEN/GI: No fluids at this time, heart healthy/carb modified diet Prophylaxis: Lovenox  Disposition: Medical telemetry, observation  History of Present Illness:  [name] is a 75 y.o. female presenting with left-sided chest pain that has been ongoing for 2-4 months.  Patient reports left-sided sharp chest pain and states "I think I have a hernia or indigestion, been eating Tums like crazy, it has not helped today.  I have been having lots of gas and burping a lot".  Patient has a history of acid  reflux, she thinks her chest pain gets worse after eating says the pain is present with both activity and rest.  Patient denies vomiting and diarrhea, reports she does not think she has had a bowel movement this week.  Review Of Systems: Per HPI with the following additions: See HPI    Patient Active Problem List   Diagnosis Date Noted  . Chest pain 02/21/2020  . Hemorrhoids 11/26/2019  . Atrial fibrillation with RVR ([place]) 10/04/2019  . GERD (gastroesophageal reflux disease) 08/26/2019  . Loss of weight 08/26/2019  . Constipation 09/09/2018  . Hypotension 03/21/2018  . Diarrhea 07/20/2017  . Primary osteoarthritis of right hip 04/18/2017  . Orthostasis 03/28/2017  . General weakness 03/28/2017  . History of cerebrovascular accident (CVA) with residual deficit 03/28/2017  . Schizophrenia ([place]) 03/28/2017  . Generalized weakness 03/28/2017  . Orthostatic hypotension   . IBS (irritable bowel syndrome) 10/16/2016  . Left hemiparesis ([place]) 10/06/2016  . Left-sided weakness 10/06/2016  . Chronic superficial gastritis without bleeding   . Stricture and stenosis of esophagus   . Nausea with vomiting 04/20/2016  . Abdominal pain, acute, bilateral lower quadrant 03/22/2016  . Lesion of right lobe of liver 03/22/2016  . Elevated troponin 02/13/2016  . IDA (iron deficiency anemia) 04/06/2015  . Mixed hyperlipidemia 11/06/2014  . Obesity due to excess calories 11/06/2014  . Sedentary lifestyle 11/06/2014  . Rectal bleeding 10/21/2014  . Encounter for therapeutic drug monitoring 03/24/2013  .  Chronic anticoagulation 04/09/2010  . Type 2 diabetes mellitus with vascular disease ([place]) 03/24/2010  . Essential hypertension, benign 03/24/2010  . DVT, lower extremity, recurrent ([place]) 03/24/2010  . History of cardiovascular disorder 03/24/2010    Past Medical History: Past Medical History:  Diagnosis Date  . Anxiety   . Arthritis   . COVID-19 virus infection    COVID-19+ approx 01/24/19;  asymptomatic course with full recovery  . Dependence on wheelchair    pivot/transfers  . Depression    History of psychosis and previous suicide attempt  . DVT, lower extremity, recurrent ([facility])    Long-term Coumadin per Dr. Legrand Rams  . Essential hypertension   . GERD (gastroesophageal reflux disease)   . Hemiplegia ([place]) 2010   Left side  . History of stroke    Acute infarct and right cerebral white matter small vessel disease 12/10  . Leg DVT (deep venous thromboembolism), acute ([place]) 2006  . Schizophrenia ([place])   . Stroke [facility])    left sided weakness  . Type 2 diabetes mellitus ([place])     Past Surgical History: Past Surgical History:  Procedure Laterality Date  . BACK SURGERY    . BIOPSY N/A 11/24/2014   Procedure: BIOPSY;  Surgeon: Danie Binder, MD;  Location: [facility] ORS;  Service: Endoscopy;  Laterality: N/A;  . BIOPSY  05/17/2016   Procedure: BIOPSY;  Surgeon: Danie Binder, MD;  Location: [facility];  Service: Endoscopy;;  gastric biopsy  . COLONOSCOPY WITH PROPOFOL N/A 11/24/2014   Dr. Rudie Meyer polyps removed/moderate sized internal hemorrhoids, tubular adenomas. Next surveillance in 3 years  . ESOPHAGOGASTRODUODENOSCOPY (EGD) WITH PROPOFOL N/A 11/24/2014   Dr. Clayburn Pert HH/patent stricture at the gastroesophageal junction, mild non-erosive gastritis, path negative for H.pylori or celiac sprue  . ESOPHAGOGASTRODUODENOSCOPY (EGD) WITH PROPOFOL N/A 05/17/2016   Procedure: ESOPHAGOGASTRODUODENOSCOPY (EGD) WITH PROPOFOL;  Surgeon: Danie Binder, MD;  Location: [facility];  Service: Endoscopy;  Laterality: N/A;  12:45pm  . GIVENS CAPSULE STUDY N/A 12/11/2014   MULTILPLE EROSION IN the stomach WITH ACTIVE OOZING. OCCASIONAL EROSIONS AND RARE ULCER SEEN IN PROXIMAL SMALL BOWEL . No masses or AVMs SEEN. NO OLD BLOOD OR FRESH BLOOD SEEN.   . POLYPECTOMY N/A 11/24/2014   Procedure: POLYPECTOMY;  Surgeon: Danie Binder, MD;  Location: [facility];  Service: Endoscopy;  Laterality: N/A;   . SAVORY DILATION N/A 05/17/2016   Procedure: SAVORY DILATION;  Surgeon: Danie Binder, MD;  Location: [facility];  Service: Endoscopy;  Laterality: N/A;    Social History: Social History   Tobacco Use  . Smoking status: Former Smoker    Packs/day: 0.25    Years: 20.00    Pack years: 5.00    Types: Cigarettes    Quit date: 01/24/1995    Years since quitting: 25.0  . Smokeless tobacco: Never Used  Vaping Use  . Vaping Use: Never used  Substance Use Topics  . Alcohol use: No    Alcohol/week: 0.0 standard drinks  . Drug use: No   Additional social history:   Please also refer to relevant sections of EMR.  Family History: Family History  Problem Relation Age of Onset  . Hypertension Mother   . Colon cancer Neg Hx      Allergies and Medications: Allergies  Allergen Reactions  . Sulfa Antibiotics Rash   No current facility-administered medications on file prior to encounter.   Current Outpatient Medications on File Prior to Encounter  Medication Sig Dispense Refill  .  ABILIFY 2 MG tablet Take 1 tablet (2 mg total) by mouth daily. (Patient taking differently: Take 2 mg by mouth at bedtime.) 28 tablet 3  . acetaminophen (TYLENOL) 325 MG tablet Take 650 mg by mouth 3 (three) times daily as needed for mild pain or moderate pain.     . calcium carbonate (TUMS - DOSED IN MG ELEMENTAL CALCIUM) 500 MG chewable tablet Chew 1 tablet by mouth 4 (four) times daily as needed for heartburn.    . Carboxymethylcellulose Sodium (ARTIFICIAL TEARS OP) Place 1 drop into both eyes 3 (three) times daily.    . Cholecalciferol (VITAMIN D3) 50 MCG (2000 UT) TABS Take 2,000 Units by mouth daily.    . clonazePAM (KLONOPIN) 0.5 MG tablet Take 1 tablet (0.5 mg total) by mouth 3 (three) times daily. 90 tablet 2  . diltiazem (CARDIZEM CD) 120 MG 24 hr capsule Take 1 capsule (120 mg total) by mouth daily. 90 capsule 1  . docusate sodium (COLACE) 100 MG capsule Take 200 mg by mouth at bedtime.     .  DULoxetine (CYMBALTA) 60 MG capsule Take 120 mg by mouth daily.    . feeding supplement, GLUCERNA SHAKE, (GLUCERNA SHAKE) LIQD Take 237 mLs by mouth 3 (three) times daily between meals.    Marland Kitchen FERREX 150 150 MG capsule Take 150 mg by mouth daily.    Marland Kitchen gabapentin (NEURONTIN) 300 MG capsule Take 300 mg by mouth 3 (three) times daily.    Marland Kitchen HYDROcodone-acetaminophen (NORCO) 10-325 MG tablet Take 1 tablet by mouth in the morning, at noon, and at bedtime.     . hydrocortisone (ANUSOL-HC) 2.5 % rectal cream Place 1 application rectally 2 (two) times daily as needed (for rectal bleeding).    . insulin detemir (LEVEMIR FLEXTOUCH) 100 UNIT/ML FlexPen Inject 38 Units into the skin at bedtime. 15 mL 3  . latanoprost (XALATAN) 0.005 % ophthalmic solution Place 1 drop into both eyes at bedtime.    Marland Kitchen loperamide (IMODIUM A-D) 2 MG tablet Take 2 mg by mouth See admin instructions. Take 2 mg by mouth after each loose stool    . loratadine (CLARITIN) 10 MG tablet Take 10 mg by mouth daily.    Marland Kitchen lubiprostone (AMITIZA) 24 MCG capsule Take 1 capsule (24 mcg total) by mouth 2 (two) times daily with a meal. 60 capsule 5  . metoprolol succinate (TOPROL-XL) 25 MG 24 hr tablet Take 1 tablet (25 mg total) by mouth daily. 90 tablet 3  . midodrine (PROAMATINE) 10 MG tablet TAKE (1) TABLET BY MOUTH (3) TIMES DAILY. (Patient taking differently: Take 10 mg by mouth 3 (three) times daily.) 90 tablet 3  . mirabegron ER (MYRBETRIQ) 25 MG TB24 tablet Take 25 mg by mouth daily.    . mirtazapine (REMERON) 30 MG tablet Take 30 mg by mouth at bedtime.    . ondansetron (ZOFRAN) 4 MG tablet Take 1 tablet (4 mg total) by mouth 4 (four) times daily -  before meals and at bedtime. (Patient taking differently: Take 4 mg by mouth 2 (two) times daily as needed for nausea or vomiting.) 120 tablet 3  . pantoprazole (PROTONIX) 40 MG tablet Take 1 tablet (40 mg total) by mouth 2 (two) times daily before a meal. 270 tablet 3  . risperiDONE (RISPERDAL) 1  MG tablet Take 1 tablet (1 mg total) by mouth in the morning and at bedtime. 56 tablet 3  . simvastatin (ZOCOR) 20 MG tablet Take 20 mg by mouth at bedtime.     Marland Kitchen  tiZANidine (ZANAFLEX) 2 MG tablet Take 2 mg by mouth daily.    . TRADJENTA 5 MG TABS tablet TAKE (1) TABLET BY MOUTH ONCE DAILY. (Patient taking differently: Take 5 mg by mouth daily.) 30 tablet 0  . warfarin (COUMADIN) 5 MG tablet Take 1 tablet (5 mg total) by mouth every evening. (Patient taking differently: Take 2.5-5 mg by mouth See admin instructions. Take 5 mg by mouth at 5 PM daily on Sun/Tues/Wed/Thurs/Fri/Sat and 2.5 mg on Mon) 30 tablet 6  . zolpidem (AMBIEN) 10 MG tablet Take 10 mg by mouth at bedtime.    Regino Schultze TEST test strip USE TO CHECK BLOOD SUGAR TWICE DAILY. (Patient taking differently: 1 each by Other route 2 (two) times daily.) 200 strip 2  . iron polysaccharides (NIFEREX) 150 MG capsule Take 1 capsule (150 mg total) by mouth daily. (Patient not taking: Reported on 02/20/2020) 30 capsule 11  . Lancets 28G MISC USE TO CHECK BLOOD SUGAR TWICE DAILY. 100 each 0    Objective: BP (!) 151/83   Pulse 80   Temp 98.1 F (36.7 C) (Oral)   Resp (!) 25   SpO2 97%  Exam: General: Nontoxic-appearing Eyes: EOMI, PERRLA ENTM: Patent nares, poor dentition Neck: No lymphadenopathy, trachea midline Cardiovascular: RRR, S1-S2 present Respiratory: CTA bilaterally, comfortable work of breathing Gastrointestinal: Tenderness to palpation of left lower quadrant of abdomen, stool burden palpated on physical exam MSK: No deformity of extremities Derm: No lesions, abrasions, rashes Neuro: No focal neurological deficits, cranial nerves II-XII intact Psych: A&O x3  Labs and Imaging: CBC BMET  Recent Labs  Lab 02/20/20 1539  WBC 5.9  HGB 10.7*  HCT 30.9*  PLT 191   Recent Labs  Lab 02/20/20 1539  NA 137  K 3.4*  CL 103  CO2 27  BUN 9  CREATININE 0.61  GLUCOSE 325*  CALCIUM 7.4*     EKG  Interpretation  Date/Time:  Friday February 20 2020 18:50:47 EST Ventricular Rate:  67 PR Interval:    QRS Duration: 90 QT Interval:  427 QTC Calculation: 451 R Axis:   58 Text Interpretation: Sinus rhythm Consider anterior infarct no acute STEMI No significant change since last tracing Confirmed by Madalyn Rob [phone]) on 02/20/2020 7:15:27 PM    Daisy Floro, DO 02/21/2020, 3:35 AM PGY-3, [place] [facility] pager: [phone], text pages welcome

## 2020-02-20 NOTE — ED Provider Notes (Signed)
[facility] EMERGENCY DEPARTMENT Provider Note   CSN: [id number] Arrival date & time: 02/20/20  1516     History Chief Complaint  Patient presents with  . Chest Pain    [name] is a 75 y.o. female.  HPI 75 year old female with a history of anxiety, depression, DVT on Coumadin, hypertension, schizophrenia, stroke, DM type II presents to the ER with complaints of chest pain. History is limited as the patient does have a history of dementia and is hard of hearing. Ports ongoing intermittent left-sided chest pain for several months, however worsening over the last 3 days. She has also started to feel more short of breath. Unclear if she has had a cough, fevers, chills. She does state that she is vaccinated but not boosted. EKG with some ST elevations in II and III in triage, EKG reviewed by Dr. Tyrone Nine who contacted cardiology. They felt as though the EKG did not qualify for code STEMI and appeared more like pericarditis. Patient reports that she ambulates with a wheelchair at baseline per triage notes, she resides at the [facility] facility and is a ward of the state  Past Medical History:  Diagnosis Date  . Anxiety   . Arthritis   . COVID-19 virus infection    COVID-19+ approx 01/24/19; asymptomatic course with full recovery  . Dependence on wheelchair    pivot/transfers  . Depression    History of psychosis and previous suicide attempt  . DVT, lower extremity, recurrent (HCC)    Long-term Coumadin per Dr. Legrand Rams  . Essential hypertension   . GERD (gastroesophageal reflux disease)   . Hemiplegia ([place]) 2010   Left side  . History of stroke    Acute infarct and right cerebral white matter small vessel disease 12/10  . Leg DVT (deep venous thromboembolism), acute ([place]) 2006  . Schizophrenia ([place])   . Stroke [facility])    left sided weakness  . Type 2 diabetes mellitus [facility])     Patient Active Problem List   Diagnosis Date Noted  . Hemorrhoids 11/26/2019  .  Atrial fibrillation with RVR ([place]) 10/04/2019  . GERD (gastroesophageal reflux disease) 08/26/2019  . Loss of weight 08/26/2019  . Constipation 09/09/2018  . Hypotension 03/21/2018  . Diarrhea 07/20/2017  . Primary osteoarthritis of right hip 04/18/2017  . Orthostasis 03/28/2017  . General weakness 03/28/2017  . History of cerebrovascular accident (CVA) with residual deficit 03/28/2017  . Schizophrenia ([place]) 03/28/2017  . Generalized weakness 03/28/2017  . Orthostatic hypotension   . IBS (irritable bowel syndrome) 10/16/2016  . Left hemiparesis ([place]) 10/06/2016  . Left-sided weakness 10/06/2016  . Chronic superficial gastritis without bleeding   . Stricture and stenosis of esophagus   . Nausea with vomiting 04/20/2016  . Abdominal pain, acute, bilateral lower quadrant 03/22/2016  . Lesion of right lobe of liver 03/22/2016  . Elevated troponin 02/13/2016  . IDA (iron deficiency anemia) 04/06/2015  . Mixed hyperlipidemia 11/06/2014  . Obesity due to excess calories 11/06/2014  . Sedentary lifestyle 11/06/2014  . Rectal bleeding 10/21/2014  . Encounter for therapeutic drug monitoring 03/24/2013  . Chronic anticoagulation 04/09/2010  . Type 2 diabetes mellitus with vascular disease ([place]) 03/24/2010  . Essential hypertension, benign 03/24/2010  . DVT, lower extremity, recurrent ([place]) 03/24/2010  . History of cardiovascular disorder 03/24/2010    Past Surgical History:  Procedure Laterality Date  . BACK SURGERY    . BIOPSY N/A 11/24/2014   Procedure: BIOPSY;  Surgeon: Danie Binder, MD;  Location: [facility] ORS;  Service: Endoscopy;  Laterality: N/A;  . BIOPSY  05/17/2016   Procedure: BIOPSY;  Surgeon: Danie Binder, MD;  Location: [facility] ENDO SUITE;  Service: Endoscopy;;  gastric biopsy  . COLONOSCOPY WITH PROPOFOL N/A 11/24/2014   Dr. Rudie Meyer polyps removed/moderate sized internal hemorrhoids, tubular adenomas. Next surveillance in 3 years  . ESOPHAGOGASTRODUODENOSCOPY (EGD) WITH  PROPOFOL N/A 11/24/2014   Dr. Clayburn Pert HH/patent stricture at the gastroesophageal junction, mild non-erosive gastritis, path negative for H.pylori or celiac sprue  . ESOPHAGOGASTRODUODENOSCOPY (EGD) WITH PROPOFOL N/A 05/17/2016   Procedure: ESOPHAGOGASTRODUODENOSCOPY (EGD) WITH PROPOFOL;  Surgeon: Danie Binder, MD;  Location: [facility] ENDO SUITE;  Service: Endoscopy;  Laterality: N/A;  12:45pm  . GIVENS CAPSULE STUDY N/A 12/11/2014   MULTILPLE EROSION IN the stomach WITH ACTIVE OOZING. OCCASIONAL EROSIONS AND RARE ULCER SEEN IN PROXIMAL SMALL BOWEL . No masses or AVMs SEEN. NO OLD BLOOD OR FRESH BLOOD SEEN.   . POLYPECTOMY N/A 11/24/2014   Procedure: POLYPECTOMY;  Surgeon: Danie Binder, MD;  Location: [facility] ORS;  Service: Endoscopy;  Laterality: N/A;  . SAVORY DILATION N/A 05/17/2016   Procedure: SAVORY DILATION;  Surgeon: Danie Binder, MD;  Location: [facility] END[facility] SU[facility];  Service: Endoscopy;  Laterality: N/A;     OB History   No obstetric history on file.     Family History  Problem Relation Age of Onset  . Hypertension Mother   . Colon cancer Neg Hx     Social History   Tobacco Use  . Smoking status: Former Smoker    Packs/day: 0.25    Years: 20.00    Pack years: 5.00    Types: Cigarettes    Quit date: 01/24/1995    Years since quitting: 25.0  . Smokeless tobacco: Never Used  Vaping Use  . Vaping Use: Never used  Substance Use Topics  . Alcohol use: No    Alcohol/week: 0.0 standard drinks  . Drug use: No    Home Medications Prior to Admission medications   Medication Sig Start Date End Date Taking? Authorizing Provider  ABILIFY 2 MG tablet Take 1 tablet (2 mg total) by mouth daily. Patient taking differently: Take 2 mg by mouth at bedtime. 12/31/19  Yes [name], MD  acetaminophen (TYLENOL) 325 MG tablet Take 650 mg by mouth 3 (three) times daily as needed for mild pain or moderate pain.    Yes [provider]  calcium carbonate (TUMS - DOSED IN MG ELEMENTAL  CALCIUM) 500 MG chewable tablet Chew 1 tablet by mouth 4 (four) times daily as needed for heartburn.   Yes [provider]  Carboxymethylcellulose Sodium (ARTIFICIAL TEARS OP) Place 1 drop into both eyes 3 (three) times daily.   Yes [provider]  Cholecalciferol (VITAMIN D3) 50 MCG (2000 UT) TABS Take 2,000 Units by mouth daily.   Yes [provider]  clonazePAM (KLONOPIN) 0.5 MG tablet Take 1 tablet (0.5 mg total) by mouth 3 (three) times daily. 01/11/18  Yes [name], MD  diltiazem (CARDIZEM CD) 120 MG 24 hr capsule Take 1 capsule (120 mg total) by mouth daily. 12/23/19  Yes Verta Ellen., NP  docusate sodium (COLACE) 100 MG capsule Take 200 mg by mouth at bedtime.    Yes [provider]  DULoxetine (CYMBALTA) 60 MG capsule Take 120 mg by mouth daily.   Yes [provider]  feeding supplement, GLUCERNA SHAKE, (GLUCERNA SHAKE) LIQD Take 237 mLs by mouth 3 (three) times daily  between meals.   Yes [provider]  FERREX 150 150 MG capsule Take 150 mg by mouth daily.   Yes [provider]  gabapentin (NEURONTIN) 300 MG capsule Take 300 mg by mouth 3 (three) times daily.   Yes [provider]  HYDROcodone-acetaminophen (NORCO) 10-325 MG tablet Take 1 tablet by mouth in the morning, at noon, and at bedtime.  02/27/18  Yes [provider]  hydrocortisone (ANUSOL-HC) 2.5 % rectal cream Place 1 application rectally 2 (two) times daily as needed (for rectal bleeding).   Yes [provider]  insulin detemir (LEVEMIR FLEXTOUCH) 100 UNIT/ML FlexPen Inject 38 Units into the skin at bedtime. 11/06/19  Yes Nida, Marella Chimes, MD  latanoprost (XALATAN) 0.005 % ophthalmic solution Place 1 drop into both eyes at bedtime.   Yes [provider]  loperamide (IMODIUM A-D) 2 MG tablet Take 2 mg by mouth See admin instructions. Take 2 mg by mouth after each loose stool  03/04/20 Yes [provider]   loratadine (CLARITIN) 10 MG tablet Take 10 mg by mouth daily.   Yes [provider]  lubiprostone (AMITIZA) 24 MCG capsule Take 1 capsule (24 mcg total) by mouth 2 (two) times daily with a meal. 07/02/19  Yes Aliene Altes S, PA-C  metoprolol succinate (TOPROL-XL) 25 MG 24 hr tablet Take 1 tablet (25 mg total) by mouth daily. 11/27/19  Yes Verta Ellen., NP  midodrine (PROAMATINE) 10 MG tablet TAKE (1) TABLET BY MOUTH (3) TIMES DAILY. Patient taking differently: Take 10 mg by mouth 3 (three) times daily. 02/09/20  Yes Verta Ellen., NP  mirabegron ER (MYRBETRIQ) 25 MG TB24 tablet Take 25 mg by mouth daily.   Yes [provider]  mirtazapine (REMERON) 30 MG tablet Take 30 mg by mouth at bedtime.   Yes [provider]  ondansetron (ZOFRAN) 4 MG tablet Take 1 tablet (4 mg total) by mouth 4 (four) times daily -  before meals and at bedtime. Patient taking differently: Take 4 mg by mouth 2 (two) times daily as needed for nausea or vomiting. 04/20/16  Yes Annitta Needs, NP  pantoprazole (PROTONIX) 40 MG tablet Take 1 tablet (40 mg total) by mouth 2 (two) times daily before a meal. 08/26/19  Yes Carlis Stable, NP  risperiDONE (RISPERDAL) 1 MG tablet Take 1 tablet (1 mg total) by mouth in the morning and at bedtime. 12/31/19  Yes Hisada, Elie Goody, MD  simvastatin (ZOCOR) 20 MG tablet Take 20 mg by mouth at bedtime.  03/31/16  Yes [provider]  tiZANidine (ZANAFLEX) 2 MG tablet Take 2 mg by mouth daily.   Yes [provider]  TRADJENTA 5 MG TABS tablet TAKE (1) TABLET BY MOUTH ONCE DAILY. Patient taking differently: Take 5 mg by mouth daily. 02/09/20  Yes Cassandria Anger, MD  warfarin (COUMADIN) 5 MG tablet Take 1 tablet (5 mg total) by mouth every evening. Patient taking differently: Take 2.5-5 mg by mouth See admin instructions. Take 5 mg by mouth at 5 PM daily on Sun/Tues/Wed/Thurs/Fri/Sat and 2.5 mg on Mon 10/05/19  Yes Emokpae, Courage, MD  zolpidem  (AMBIEN) 10 MG tablet Take 10 mg by mouth at bedtime.   Yes [provider]  EASYMAX TEST test strip USE TO CHECK BLOOD SUGAR TWICE DAILY. Patient taking differently: 1 each by Other route 2 (two) times daily. 01/21/20   Cassandria Anger, MD  iron polysaccharides (NIFEREX) 150 MG capsule Take 1 capsule (  150 mg total) by mouth daily. Patient not taking: Reported on 02/20/2020 04/10/16   Baird Cancer, PA-C  Lancets 28G MISC USE TO CHECK BLOOD SUGAR TWICE DAILY. 12/25/19   Brita Romp, NP    Allergies    Sulfa antibiotics  Review of Systems   Review of Systems  Constitutional: Negative for chills and fever.  HENT: Negative for ear pain and sore throat.   Eyes: Negative for pain and visual disturbance.  Respiratory: Positive for shortness of breath. Negative for cough.   Cardiovascular: Positive for chest pain. Negative for palpitations and leg swelling.  Gastrointestinal: Negative for abdominal pain and vomiting.  Genitourinary: Negative for dysuria and hematuria.  Musculoskeletal: Negative for arthralgias and back pain.  Skin: Negative for color change and rash.  Neurological: Negative for seizures and syncope.  All other systems reviewed and are negative.   Physical Exam Updated Vital Signs BP 124/61   Pulse 66   Temp 98.1 F (36.7 C) (Oral)   Resp 15   SpO2 99%   Physical Exam Vitals and nursing note reviewed.  Constitutional:      General: She is not in acute distress.    Appearance: She is well-developed and well-nourished. She is not ill-appearing, toxic-appearing or diaphoretic.  HENT:     Head: Normocephalic and atraumatic.  Eyes:     Conjunctiva/sclera: Conjunctivae normal.  Cardiovascular:     Rate and Rhythm: Normal rate and regular rhythm.     Heart sounds: Normal heart sounds. No murmur heard.   Pulmonary:     Effort: Pulmonary effort is normal. No respiratory distress.     Breath sounds: Normal breath sounds. No decreased breath  sounds or wheezing.  Abdominal:     Palpations: Abdomen is soft.     Tenderness: There is no abdominal tenderness.  Musculoskeletal:        General: No edema. Normal range of motion.     Cervical back: Neck supple.     Right lower leg: No tenderness. No edema.     Left lower leg: No tenderness. No edema.  Skin:    General: Skin is warm and dry.  Neurological:     Mental Status: She is alert.  Psychiatric:        Mood and Affect: Mood and affect normal.     ED Results / Procedures / Treatments   Labs (all labs ordered are listed, but only abnormal results are displayed) Labs Reviewed  BASIC METABOLIC PANEL - Abnormal; Notable for the following components:      Result Value   Potassium 3.4 (*)    Glucose, Bld 325 (*)    Calcium 7.4 (*)    All other components within normal limits  CBC - Abnormal; Notable for the following components:   RBC 3.51 (*)    Hemoglobin 10.7 (*)    HCT 30.9 (*)    All other components within normal limits  SARS CORONAVIRUS 2 BY RT PCR (HOSPITAL ORDER, [facility])  CBG MONITORING, ED  TROPONIN I (HIGH SENSITIVITY)  TROPONIN I (HIGH SENSITIVITY)    EKG EKG Interpretation  Date/Time:  Friday February 20 2020 15:24:29 EST Ventricular Rate:  78 PR Interval:    QRS Duration: 88 QT Interval:  390 QTC Calculation: 445 R Axis:   47 Text Interpretation: Sinus rhythm Consider anterior infarct st concave up with slight elevation in the inferior leads Otherwise no significant change Confirmed by Deno Etienne [phone]) on 02/20/2020 4:05:25  PM   Radiology CT Angio Chest PE W and/or Wo Contrast  Result Date: 02/20/2020 CLINICAL DATA:  Chest pain, shortness of breath EXAM: CT ANGIOGRAPHY CHEST WITH CONTRAST TECHNIQUE: Multidetector CT imaging of the chest was performed using the standard protocol during bolus administration of intravenous contrast. Multiplanar CT image reconstructions and MIPs were obtained to evaluate the vascular  anatomy. CONTRAST:  13mL OMNIPAQUE IOHEXOL 350 MG/ML SOLN COMPARISON:  07/14/2004 FINDINGS: Cardiovascular: No filling defects in the pulmonary arteries to suggest pulmonary emboli. Heart is normal size. Coronary artery and aortic calcifications. No aneurysm. Mediastinum/Nodes: No mediastinal, hilar, or axillary adenopathy. Trachea and esophagus are unremarkable. Thyroid unremarkable. Lungs/Pleura: Calcified granuloma at the right lung base. No confluent opacities or effusions. Upper Abdomen: Imaging into the upper abdomen demonstrates no acute findings. Musculoskeletal: Chest wall soft tissues are unremarkable. No acute bony abnormality. Review of the MIP images confirms the above findings. IMPRESSION: No evidence of pulmonary embolus. Coronary artery disease. No acute cardiopulmonary disease. Aortic Atherosclerosis (ICD10-I70.0). Electronically Signed   By: Rolm Baptise M.D.   On: 02/20/2020 17:58   DG Chest Portable 1 View  Result Date: 02/20/2020 CLINICAL DATA:  Chest pain EXAM: PORTABLE CHEST 1 VIEW COMPARISON:  October 04, 2019 FINDINGS: There is ill-defined airspace opacity in the left base. There is also a small focus of airspace opacity abutting the right hemidiaphragm. Lungs otherwise are clear. Heart is upper normal in size with pulmonary vascularity normal. No adenopathy. There is aortic atherosclerosis. Bones are osteoporotic. IMPRESSION: Airspace opacity in the left lower lobe and to a lesser extent right base, consistent with multifocal pneumonia. This finding may warrant check of COVID-19 status. Lungs elsewhere clear. Heart upper normal in size. Aortic Atherosclerosis (ICD10-I70.0). Electronically Signed   By: Lowella Grip III M.D.   On: 02/20/2020 15:52    Procedures Procedures   Medications Ordered in ED Medications  fentaNYL (SUBLIMAZE) injection 50 mcg (has no administration in time range)  iohexol (OMNIPAQUE) 350 MG/ML injection 75 mL (75 mLs Intravenous Contrast Given  02/20/20 1745)    ED Course  I have reviewed the triage vital signs and the nursing notes.  Pertinent labs & imaging results that were available during my care of the patient were reviewed by me and considered in my medical decision making (see chart for details).    MDM Rules/Calculators/A&P                          75 year old female presents with chest pain. Vitals on arrival overall reassuring, evidence of tachycardia, hypoxia, blood pressure is normal. Physical exam benign, no extremity edema, lung sounds clear. Speaking full sentences without increased work of breathing.  Dr. Tyrone Nine spoke with cardiology upon reviewing the patient's EKG that was done in triage. No acute STEMI was activated.  Labs ordered, reviewed and interpreted by me. CBC without leukocytosis, hemoglobin of 10.7 which appears to be stable. BMP with mild hypokalemia 3.4, normal creatinine. Calcium 7.4 which is slightly decreased from prior, overall stable.  Chest x-ray with left lower lobe consolidation consistent with multifocal pneumonia, question COVID-19 status. Her PCR test is negative here. Initial troponin of 10.  Given ongoing complaints of left-sided chest pain, CT PE study was ordered. This was negative. Chest xray with concern for pneumonia however CT scan without evidence of pneumonia. No fever or cough.   Patient given fentanyl for pain. Plan to recheck second EKG, troponin.   Singed out care to Dr. Roslynn Amble  who will oversee the rest of her care and dispo accordingly.  Final Clinical Impression(s) / ED Diagnoses Final diagnoses:  Chest pain, unspecified type    Rx / DC Orders ED Discharge Orders    None       Lyndel Safe 02/20/20 1844    Lucrezia Starch, MD 02/20/20 2029

## 2020-02-20 NOTE — ED Triage Notes (Signed)
Pt arrived to ED via from [facility] facility. Pt with left side chest pain that radiates to left shoulder. Pt reports pain for months and recently began with sob. Pt is ward of state and hx is unclear. Pt was given 2 nitro and 324 mg of asa enroute. Pain remains 7/10

## 2020-02-20 NOTE — ED Provider Notes (Signed)
I discussed case with Dr. Sallyanne Kuster.  The patient had arrived with a chief complaint of chest pain though history is somewhat difficult secondary to her history of dementia.  She told that she has had chronic chest pain for at least 4 months.  She is having difficulty explaining any specific worsening.  She does have elevations leads II and III of the ST segments on my view of the EKG with possible flipped T waves and aVL.  It is concave up.  On discussion with the cardiologist it was felt to be not qualifying for a STEMI.  Recommended normal evaluation in the ED.  They felt the EKG was more consistent with pericarditis-like picture.   Deno Etienne, DO 02/20/20 1617

## 2020-02-21 ENCOUNTER — Other Ambulatory Visit: Payer: Self-pay

## 2020-02-21 DIAGNOSIS — H6690 Otitis media, unspecified, unspecified ear: Secondary | ICD-10-CM | POA: Diagnosis not present

## 2020-02-21 DIAGNOSIS — Z794 Long term (current) use of insulin: Secondary | ICD-10-CM | POA: Diagnosis not present

## 2020-02-21 DIAGNOSIS — M21372 Foot drop, left foot: Secondary | ICD-10-CM | POA: Diagnosis not present

## 2020-02-21 DIAGNOSIS — D500 Iron deficiency anemia secondary to blood loss (chronic): Secondary | ICD-10-CM | POA: Diagnosis not present

## 2020-02-21 DIAGNOSIS — M2550 Pain in unspecified joint: Secondary | ICD-10-CM | POA: Diagnosis not present

## 2020-02-21 DIAGNOSIS — Z7984 Long term (current) use of oral hypoglycemic drugs: Secondary | ICD-10-CM | POA: Diagnosis not present

## 2020-02-21 DIAGNOSIS — R296 Repeated falls: Secondary | ICD-10-CM | POA: Diagnosis not present

## 2020-02-21 DIAGNOSIS — R0789 Other chest pain: Secondary | ICD-10-CM | POA: Diagnosis not present

## 2020-02-21 DIAGNOSIS — I69354 Hemiplegia and hemiparesis following cerebral infarction affecting left non-dominant side: Secondary | ICD-10-CM | POA: Diagnosis not present

## 2020-02-21 DIAGNOSIS — R079 Chest pain, unspecified: Secondary | ICD-10-CM | POA: Diagnosis present

## 2020-02-21 DIAGNOSIS — Z7401 Bed confinement status: Secondary | ICD-10-CM | POA: Diagnosis not present

## 2020-02-21 DIAGNOSIS — F209 Schizophrenia, unspecified: Secondary | ICD-10-CM | POA: Diagnosis not present

## 2020-02-21 DIAGNOSIS — K219 Gastro-esophageal reflux disease without esophagitis: Secondary | ICD-10-CM | POA: Diagnosis not present

## 2020-02-21 DIAGNOSIS — I4891 Unspecified atrial fibrillation: Secondary | ICD-10-CM | POA: Diagnosis not present

## 2020-02-21 DIAGNOSIS — Z7901 Long term (current) use of anticoagulants: Secondary | ICD-10-CM | POA: Diagnosis not present

## 2020-02-21 DIAGNOSIS — E1151 Type 2 diabetes mellitus with diabetic peripheral angiopathy without gangrene: Secondary | ICD-10-CM | POA: Diagnosis not present

## 2020-02-21 DIAGNOSIS — R5381 Other malaise: Secondary | ICD-10-CM | POA: Diagnosis not present

## 2020-02-21 LAB — BASIC METABOLIC PANEL
Anion gap: 11 (ref 5–15)
BUN: 8 mg/dL (ref 8–23)
CO2: 25 mmol/L (ref 22–32)
Calcium: 8.8 mg/dL — ABNORMAL LOW (ref 8.9–10.3)
Chloride: 98 mmol/L (ref 98–111)
Creatinine, Ser: 0.78 mg/dL (ref 0.44–1.00)
GFR, Estimated: >60 mL/min (ref >60)
Glucose, Bld: 330 mg/dL — ABNORMAL HIGH (ref 70–99)
Potassium: 3.8 mmol/L (ref 3.5–5.1)
Sodium: 134 mmol/L — ABNORMAL LOW (ref 135–145)

## 2020-02-21 LAB — CBC
HCT: 36.1 % (ref 36.0–46.0)
Hemoglobin: 12.8 g/dL (ref 12.0–15.0)
MCH: 30.5 pg (ref 26.0–34.0)
MCHC: 35.5 g/dL (ref 30.0–36.0)
MCV: 86 fL (ref 80.0–100.0)
Platelets: 211 10*3/uL (ref 150–400)
RBC: 4.2 MIL/uL (ref 3.87–5.11)
RDW: 12.5 % (ref 11.5–15.5)
WBC: 7.7 10*3/uL (ref 4.0–10.5)
nRBC: 0 % (ref 0.0–0.2)

## 2020-02-21 LAB — CBG MONITORING, ED
Glucose-Capillary: 317 mg/dL — ABNORMAL HIGH (ref 70–99)
Glucose-Capillary: 237 mg/dL — ABNORMAL HIGH (ref 70–99)
Glucose-Capillary: 266 mg/dL — ABNORMAL HIGH (ref 70–99)

## 2020-02-21 LAB — HEMOGLOBIN A1C
Hgb A1c MFr Bld: 9.2 % — ABNORMAL HIGH (ref 4.8–5.6)
Mean Plasma Glucose: 217.34 mg/dL

## 2020-02-21 LAB — PROTIME-INR
INR: 1.9 — ABNORMAL HIGH (ref 0.8–1.2)
Prothrombin Time: 20.9 seconds — ABNORMAL HIGH (ref 11.4–15.2)

## 2020-02-21 MED ORDER — PANTOPRAZOLE SODIUM 40 MG PO TBEC
40.0000 mg | DELAYED_RELEASE_TABLET | Freq: Two times a day (BID) | ORAL | Status: DC
  Administered 2020-02-21: 40 mg via ORAL
  Filled 2020-02-21: qty 1

## 2020-02-21 MED ORDER — MIRTAZAPINE 30 MG PO TABS
30.0000 mg | ORAL_TABLET | Freq: Every day | ORAL | Status: DC
  Administered 2020-02-21: 30 mg via ORAL
  Filled 2020-02-21 (×3): qty 1

## 2020-02-21 MED ORDER — CLONAZEPAM 0.5 MG PO TABS: 0.5000 mg | ORAL_TABLET | Freq: Three times a day (TID) | ORAL | 2 refills | Status: DC | PRN

## 2020-02-21 MED ORDER — APIXABAN 5 MG PO TABS
5.0000 mg | ORAL_TABLET | Freq: Two times a day (BID) | ORAL | 0 refills | Status: AC
Start: 2020-02-21 — End: ?

## 2020-02-21 MED ORDER — SODIUM CHLORIDE 0.9 % IV BOLUS
1000.0000 mL | Freq: Once | INTRAVENOUS | Status: AC
  Administered 2020-02-21: 1000 mL via INTRAVENOUS

## 2020-02-21 MED ORDER — SENNA 8.6 MG PO TABS: 1.0000 | ORAL_TABLET | Freq: Every day | ORAL | 0 refills | Status: DC

## 2020-02-21 MED ORDER — DULOXETINE HCL 60 MG PO CPEP
120.0000 mg | ORAL_CAPSULE | Freq: Every day | ORAL | Status: DC
Start: 2020-02-21 — End: 2020-02-21
  Administered 2020-02-21: 120 mg via ORAL
  Filled 2020-02-21: qty 2

## 2020-02-21 MED ORDER — ATORVASTATIN CALCIUM 40 MG PO TABS: 40.0000 mg | ORAL_TABLET | Freq: Every day | ORAL | 0 refills | Status: DC

## 2020-02-21 MED ORDER — METOPROLOL SUCCINATE ER 25 MG PO TB24
25.0000 mg | ORAL_TABLET | Freq: Every day | ORAL | Status: DC
  Administered 2020-02-21: 25 mg via ORAL
  Filled 2020-02-21: qty 1

## 2020-02-21 MED ORDER — POLYETHYLENE GLYCOL 3350 17 G PO PACK: 17.0000 g | PACK | Freq: Every day | ORAL | 0 refills | Status: DC

## 2020-02-21 MED ORDER — ALUM & MAG HYDROXIDE-SIMETH 200-200-20 MG/5ML PO SUSP: 15.0000 mL | Freq: Four times a day (QID) | ORAL | Status: DC | PRN

## 2020-02-21 MED ORDER — INSULIN ASPART 100 UNIT/ML ~~LOC~~ SOLN
0.0000 [IU] | Freq: Every day | SUBCUTANEOUS | Status: DC
  Administered 2020-02-21: 4 [IU] via SUBCUTANEOUS

## 2020-02-21 MED ORDER — POLYETHYLENE GLYCOL 3350 17 G PO PACK
17.0000 g | PACK | Freq: Every day | ORAL | Status: DC
  Administered 2020-02-21: 17 g via ORAL
  Filled 2020-02-21: qty 1

## 2020-02-21 MED ORDER — LORATADINE 10 MG PO TABS
10.0000 mg | ORAL_TABLET | Freq: Every day | ORAL | Status: DC
Start: 2020-02-21 — End: 2020-02-21
  Administered 2020-02-21: 10 mg via ORAL
  Filled 2020-02-21: qty 1

## 2020-02-21 MED ORDER — RISPERIDONE 1 MG PO TABS
1.0000 mg | ORAL_TABLET | Freq: Two times a day (BID) | ORAL | Status: DC
  Administered 2020-02-21 (×2): 1 mg via ORAL
  Filled 2020-02-21 (×4): qty 1

## 2020-02-21 MED ORDER — INSULIN ASPART 100 UNIT/ML ~~LOC~~ SOLN
0.0000 [IU] | Freq: Three times a day (TID) | SUBCUTANEOUS | Status: DC
  Administered 2020-02-21: 5 [IU] via SUBCUTANEOUS
  Administered 2020-02-21: 3 [IU] via SUBCUTANEOUS

## 2020-02-21 MED ORDER — POLYVINYL ALCOHOL 1.4 % OP SOLN: 1.0000 [drp] | OPHTHALMIC | Status: DC | PRN

## 2020-02-21 MED ORDER — PANTOPRAZOLE SODIUM 40 MG PO TBEC: 40.0000 mg | DELAYED_RELEASE_TABLET | Freq: Every day | ORAL | 3 refills | Status: DC

## 2020-02-21 MED ORDER — HYDROCODONE-ACETAMINOPHEN 10-325 MG PO TABS: 1.0000 | ORAL_TABLET | Freq: Three times a day (TID) | ORAL | Status: DC | PRN

## 2020-02-21 MED ORDER — TIZANIDINE HCL 2 MG PO TABS: 2.0000 mg | ORAL_TABLET | Freq: Every day | ORAL | Status: DC | PRN

## 2020-02-21 MED ORDER — GABAPENTIN 300 MG PO CAPS: 300.0000 mg | ORAL_CAPSULE | Freq: Three times a day (TID) | ORAL | Status: DC | PRN

## 2020-02-21 MED ORDER — INSULIN DETEMIR 100 UNIT/ML ~~LOC~~ SOLN
19.0000 [IU] | Freq: Every day | SUBCUTANEOUS | Status: DC
  Administered 2020-02-21: 19 [IU] via SUBCUTANEOUS
  Filled 2020-02-21: qty 0.19

## 2020-02-21 MED ORDER — GLUCERNA SHAKE PO LIQD: 237.0000 mL | Freq: Three times a day (TID) | ORAL | Status: DC

## 2020-02-21 MED ORDER — WARFARIN - PHARMACIST DOSING INPATIENT: Freq: Every day | Status: DC

## 2020-02-21 MED ORDER — LUBIPROSTONE 24 MCG PO CAPS
24.0000 ug | ORAL_CAPSULE | Freq: Two times a day (BID) | ORAL | Status: DC
  Administered 2020-02-21: 24 ug via ORAL
  Filled 2020-02-21 (×3): qty 1

## 2020-02-21 MED ORDER — CLONAZEPAM 0.5 MG PO TABS
0.5000 mg | ORAL_TABLET | Freq: Three times a day (TID) | ORAL | Status: DC | PRN
Start: 2020-02-21 — End: 2020-02-21
  Administered 2020-02-21: 0.5 mg via ORAL
  Filled 2020-02-21: qty 1

## 2020-02-21 MED ORDER — SENNA 8.6 MG PO TABS
1.0000 | ORAL_TABLET | Freq: Every day | ORAL | Status: DC
  Administered 2020-02-21: 8.6 mg via ORAL
  Filled 2020-02-21: qty 1

## 2020-02-21 MED ORDER — TIZANIDINE HCL 4 MG PO TABS
2.0000 mg | ORAL_TABLET | Freq: Every day | ORAL | Status: DC
Start: 2020-02-21 — End: 2020-02-21
  Administered 2020-02-21: 2 mg via ORAL
  Filled 2020-02-21: qty 1

## 2020-02-21 MED ORDER — MIRABEGRON ER 25 MG PO TB24
25.0000 mg | ORAL_TABLET | Freq: Every day | ORAL | Status: DC
Start: 2020-02-21 — End: 2020-02-21
  Administered 2020-02-21: 25 mg via ORAL
  Filled 2020-02-21: qty 1

## 2020-02-21 MED ORDER — APIXABAN (ELIQUIS) EDUCATION KIT FOR DVT/PE PATIENTS: PACK | Freq: Once | Status: DC

## 2020-02-21 MED ORDER — WARFARIN SODIUM 2.5 MG PO TABS: 2.5000 mg | ORAL_TABLET | ORAL | Status: DC

## 2020-02-21 MED ORDER — HYDROCODONE-ACETAMINOPHEN 10-325 MG PO TABS
1.0000 | ORAL_TABLET | Freq: Three times a day (TID) | ORAL | Status: DC
  Administered 2020-02-21: 1 via ORAL
  Filled 2020-02-21 (×2): qty 1

## 2020-02-21 MED ORDER — ARIPIPRAZOLE 2 MG PO TABS
2.0000 mg | ORAL_TABLET | Freq: Every day | ORAL | Status: DC
  Administered 2020-02-21: 2 mg via ORAL
  Filled 2020-02-21 (×3): qty 1

## 2020-02-21 MED ORDER — POLYSACCHARIDE IRON COMPLEX 150 MG PO CAPS
150.0000 mg | ORAL_CAPSULE | Freq: Every day | ORAL | Status: DC
  Administered 2020-02-21: 150 mg via ORAL
  Filled 2020-02-21: qty 1

## 2020-02-21 MED ORDER — ENOXAPARIN SODIUM 40 MG/0.4ML ~~LOC~~ SOLN
40.0000 mg | Freq: Every day | SUBCUTANEOUS | Status: DC
  Administered 2020-02-21: 40 mg via SUBCUTANEOUS
  Filled 2020-02-21: qty 0.4

## 2020-02-21 MED ORDER — DILTIAZEM HCL ER COATED BEADS 120 MG PO CP24
120.0000 mg | ORAL_CAPSULE | Freq: Every day | ORAL | Status: DC
  Administered 2020-02-21: 120 mg via ORAL
  Filled 2020-02-21: qty 1

## 2020-02-21 MED ORDER — WARFARIN SODIUM 5 MG PO TABS
5.0000 mg | ORAL_TABLET | Freq: Once | ORAL | Status: DC
  Filled 2020-02-21: qty 1

## 2020-02-21 MED ORDER — APIXABAN 5 MG PO TABS
5.0000 mg | ORAL_TABLET | Freq: Two times a day (BID) | ORAL | Status: DC
  Administered 2020-02-21: 5 mg via ORAL
  Filled 2020-02-21: qty 1

## 2020-02-21 MED ORDER — SIMVASTATIN 20 MG PO TABS
20.0000 mg | ORAL_TABLET | Freq: Every day | ORAL | Status: DC
  Administered 2020-02-21: 20 mg via ORAL
  Filled 2020-02-21: qty 1

## 2020-02-21 MED ORDER — ATORVASTATIN CALCIUM 40 MG PO TABS: 40.0000 mg | ORAL_TABLET | Freq: Every day | ORAL | Status: DC

## 2020-02-21 MED ORDER — LATANOPROST 0.005 % OP SOLN
1.0000 [drp] | Freq: Every day | OPHTHALMIC | Status: DC
  Administered 2020-02-21: 1 [drp] via OPHTHALMIC
  Filled 2020-02-21: qty 2.5

## 2020-02-21 MED ORDER — ALUM & MAG HYDROXIDE-SIMETH 200-200-20 MG/5ML PO SUSP
15.0000 mL | Freq: Two times a day (BID) | ORAL | Status: DC
  Administered 2020-02-21: 15 mL via ORAL
  Filled 2020-02-21: qty 30

## 2020-02-21 MED ORDER — GABAPENTIN 300 MG PO CAPS
300.0000 mg | ORAL_CAPSULE | Freq: Three times a day (TID) | ORAL | Status: DC
  Administered 2020-02-21 (×2): 300 mg via ORAL
  Filled 2020-02-21 (×2): qty 1

## 2020-02-21 NOTE — ED Notes (Signed)
Tele  Breakfast Ordered 

## 2020-02-21 NOTE — Progress Notes (Signed)
Family Medicine Teaching Service Daily Progress Note Intern Pager: [phone]  Patient name: [name] Medical record number: [id number] Date of birth: [birth year] Age: 75 y.o. Gender: female  Primary Care Provider: [name], MD Consultants: None Code Status: DNI  Pt Overview and Major Events to Date:  1/28-admitted for ACS rule out  Assessment and Plan: [name] is a 75 y.o. female presenting with chest pain. PMH is significant for dementia, schizophrenia, chronic chest pain, uncontrolled type 2 diabetes, hypertension, hiatal hernia, and acid reflux.  Chest pain-very likely noncardiac.  Patient endorses improved pain from presentation.  Indicates that the pain is in her left breast area and is related to her gas.  The reflux medications have helped.  Initial troponin negative.  EKG reviewed by cardiology and waiting that it is not meeting STEMI criteria.  Patient appears very dehydrated on exam and is requesting p.o. fluids.  We will also do IV fluid bolus. -Vital signs per routine -Continuous pulse ox, cardiac monitoring -PPI, Maalox, Amitiza, Senokot, MiraLAX to cover GI cause of chest pain -Continuous cardiac and pulse ox monitoring -Up with assistance -PT/OT  A. Fib-INR therapeutic 1/26, 1.9 today.  Warfarin dosing per pharmacy.  Patient is stable with rate controlled in sinus rhythm currently. -Continue Cardizem and metoprolol -Warfarin per pharmacy  T2DM-chronic, poorly controlled.  CBGs low 200s to low 300s.  A1c 9.2 this admission. -Sensitive sliding scale insulin before meals and nightly - Levemir 19 units daily -Continue gabapentin - continue home Tradjenta  GERD  GI Upset  Constipation: Patient with previous history of acid reflux and IBS for which she is prescribed AMitiza.  Reports lots of discomfort with gassiness.  Patient had a colonoscopy in 2016 in which 3 polyps were removed patient had tubular adenomas and internal hemorrhoids.  Was  recommended she follow-up in 3 years.  At the same time patient had EGD which showed patent stricture at the GE junction, mild nonerosive gastritis.  Abdominal x-ray also revealing stool burden, patient reveals she has not had a bowel movement in about 1 week. -PPI 40 mg twice daily -Senokot daily, MiraLAX daily -Continue Amitiza 24 mcg twice daily with meals - GI workup inpatient vs outpatient  Dyslipidemia- chronic, stable -continue atorvastatin 40   Schizophrenia- chronic, stable -Continue home medications   -Abilify 2 mg at bedtime  -Klonopin 0.5 mg 3 times daily as needed  -Cymbalta 120 mg daily   -Risperidone 1 mg twice daily  -Mirtazapine 30 mg at bedtime  Chronic pain: Patient with history of chronic pain for which she takes tizanidine 2 mg daily and Norco 10 mg 3 times daily.  Patient also prescribed gabapentin 300 mg 3 times daily and Cymbalta 120 mg daily. -Plan to continue tizanidine, Norco PRN, gabapentin, and Cymbalta  QTC prolongation: Patient's QTC today prolonged at 451. -Try our best to avoid QTC prolongation medications   Chronic IDA- stable, improved. hgb 12.8 today -Continue iron polysaccharide - CBC am  Dry eyes- chronic, stable. -Continue Liquifilm Tears and latanoprost daily  FEN/GI: heart healthy/carb modified diet Prophylaxis: Lovenox Status is: Observation  The patient remains OBS appropriate and will d/c before 2 midnights.  Dispo: The patient is from: ILF              Anticipated d/c is to: SNF              Anticipated d/c date is: 1 day              Patient  currently is not medically stable to d/c.   Difficult to place patient No  Subjective:  Patient really wants to go home. She feels better than on presentation with small amount of left breast pain residual.  The GERD medications have helped.  She feels very dry and thirsty.  Concerned that her dentures are not secure. Asked patient if she would be open to going to rehab and it sounded  like she would deny that and just wants to go back to her regular home.  She would be open to home health PT but does not want to go to any facilities.  Objective: Temp:  [98.1 F (36.7 C)] 98.1 F (36.7 C) (01/28 1524) Pulse Rate:  [66-86] 86 (01/29 0600) Resp:  [12-25] 13 (01/29 0600) BP: (117-158)/(39-97) 125/39 (01/29 0600) SpO2:  [95 %-100 %] 96 % (01/29 0600) Physical Exam: General: Extremely dry mouth, hypovolemic, NAD HEENT: Cardiovascular: RRR, no murmurs, poor cap refill Respiratory: No increased effort Abdomen: Soft, nontender Extremities: Negative lower extremity edema  Laboratory: Recent Labs  Lab 02/20/20 1539 02/21/20 0325  WBC 5.9 7.7  HGB 10.7* 12.8  HCT 30.9* 36.1  PLT 191 211   Recent Labs  Lab 02/20/20 1539 02/21/20 0325  NA 137 134*  K 3.4* 3.8  CL 103 98  CO2 27 25  BUN 9 8  CREATININE 0.61 0.78  CALCIUM 7.4* 8.8*  GLUCOSE 325* 330*   hgb A1c 9.2 INR 1.9  Imaging/Diagnostic Tests: DG Abd 1 View  Result Date: 02/20/2020 CLINICAL DATA:  Abdominal pain. EXAM: ABDOMEN - 1 VIEW COMPARISON:  None. FINDINGS: Single supine view of the abdomen. Moderate volume of stool throughout the colon. No evidence of small bowel dilatation or obstruction. No obvious free air on the single supine view. Bilateral renal nephrograms and excreted contrast in the urinary bladder from prior chest CT. No visualized radiopaque calculi or abnormal soft tissue calcifications. The bones are under mineralized. IMPRESSION: Moderate volume of colonic stool. No bowel obstruction. Electronically Signed   By: Keith Rake M.D.   On: 02/20/2020 23:36   CT Angio Chest PE W and/or Wo Contrast  Result Date: 02/20/2020 CLINICAL DATA:  Chest pain, shortness of breath EXAM: CT ANGIOGRAPHY CHEST WITH CONTRAST TECHNIQUE: Multidetector CT imaging of the chest was performed using the standard protocol during bolus administration of intravenous contrast. Multiplanar CT image reconstructions  and MIPs were obtained to evaluate the vascular anatomy. CONTRAST:  69mL OMNIPAQUE IOHEXOL 350 MG/ML SOLN COMPARISON:  07/14/2004 FINDINGS: Cardiovascular: No filling defects in the pulmonary arteries to suggest pulmonary emboli. Heart is normal size. Coronary artery and aortic calcifications. No aneurysm. Mediastinum/Nodes: No mediastinal, hilar, or axillary adenopathy. Trachea and esophagus are unremarkable. Thyroid unremarkable. Lungs/Pleura: Calcified granuloma at the right lung base. No confluent opacities or effusions. Upper Abdomen: Imaging into the upper abdomen demonstrates no acute findings. Musculoskeletal: Chest wall soft tissues are unremarkable. No acute bony abnormality. Review of the MIP images confirms the above findings. IMPRESSION: No evidence of pulmonary embolus. Coronary artery disease. No acute cardiopulmonary disease. Aortic Atherosclerosis (ICD10-I70.0). Electronically Signed   By: Rolm Baptise M.D.   On: 02/20/2020 17:58   DG Chest Portable 1 View  Result Date: 02/20/2020 CLINICAL DATA:  Chest pain EXAM: PORTABLE CHEST 1 VIEW COMPARISON:  October 04, 2019 FINDINGS: There is ill-defined airspace opacity in the left base. There is also a small focus of airspace opacity abutting the right hemidiaphragm. Lungs otherwise are clear. Heart is upper normal in size with  pulmonary vascularity normal. No adenopathy. There is aortic atherosclerosis. Bones are osteoporotic. IMPRESSION: Airspace opacity in the left lower lobe and to a lesser extent right base, consistent with multifocal pneumonia. This finding may warrant check of COVID-19 status. Lungs elsewhere clear. Heart upper normal in size. Aortic Atherosclerosis (ICD10-I70.0). Electronically Signed   By: Lowella Grip III M.D.   On: 02/20/2020 15:52    Richarda Osmond, DO 02/21/2020, 8:23 AM PGY-3, [place] Intern pager: [phone], text pages welcome

## 2020-02-21 NOTE — ED Notes (Addendum)
Called report to [place], report given to [place], [place]. Attempted to call report to pt's guardian, [name] w/DSS, only number is for DSS and after hours line did not allow for leaving a message, attempt unsuccessful.

## 2020-02-21 NOTE — Progress Notes (Signed)
ANTICOAGULATION CONSULT NOTE - Initial Consult  Pharmacy Consult for Coumadin Indication: h/o DVT  Allergies  Allergen Reactions  . Sulfa Antibiotics Rash    Patient Measurements:    Vital Signs: Temp: 98.1 F (36.7 C) (01/28 1524) Temp Source: Oral (01/28 1524) BP: 158/89 (01/29 0034) Pulse Rate: 76 (01/29 0034)  Labs: Recent Labs    02/18/20 1054 02/20/20 1539 02/20/20 1733  HGB  --  10.7*  --   HCT  --  30.9*  --   PLT  --  191  --   INR 2.2  --   --   CREATININE  --  0.61  --   TROPONINIHS  --  10 12    CrCl cannot be calculated (Unknown ideal weight.).   Medical History: Past Medical History:  Diagnosis Date  . Anxiety   . Arthritis   . COVID-19 virus infection    COVID-19+ approx 01/24/19; asymptomatic course with full recovery  . Dependence on wheelchair    pivot/transfers  . Depression    History of psychosis and previous suicide attempt  . DVT, lower extremity, recurrent (HCC)    Long-term Coumadin per Dr. Legrand Rams  . Essential hypertension   . GERD (gastroesophageal reflux disease)   . Hemiplegia ([place]) 2010   Left side  . History of stroke    Acute infarct and right cerebral white matter small vessel disease 12/10  . Leg DVT (deep venous thromboembolism), acute ([place]) 2006  . Schizophrenia ([place])   . Stroke [facility])    left sided weakness  . Type 2 diabetes mellitus (HCC)     Medications:  No current facility-administered medications on file prior to encounter.   Current Outpatient Medications on File Prior to Encounter  Medication Sig Dispense Refill  . ABILIFY 2 MG tablet Take 1 tablet (2 mg total) by mouth daily. (Patient taking differently: Take 2 mg by mouth at bedtime.) 28 tablet 3  . acetaminophen (TYLENOL) 325 MG tablet Take 650 mg by mouth 3 (three) times daily as needed for mild pain or moderate pain.     . calcium carbonate (TUMS - DOSED IN MG ELEMENTAL CALCIUM) 500 MG chewable tablet Chew 1 tablet by mouth 4 (four) times daily as  needed for heartburn.    . Carboxymethylcellulose Sodium (ARTIFICIAL TEARS OP) Place 1 drop into both eyes 3 (three) times daily.    . Cholecalciferol (VITAMIN D3) 50 MCG (2000 UT) TABS Take 2,000 Units by mouth daily.    . clonazePAM (KLONOPIN) 0.5 MG tablet Take 1 tablet (0.5 mg total) by mouth 3 (three) times daily. 90 tablet 2  . diltiazem (CARDIZEM CD) 120 MG 24 hr capsule Take 1 capsule (120 mg total) by mouth daily. 90 capsule 1  . docusate sodium (COLACE) 100 MG capsule Take 200 mg by mouth at bedtime.     . DULoxetine (CYMBALTA) 60 MG capsule Take 120 mg by mouth daily.    . feeding supplement, GLUCERNA SHAKE, (GLUCERNA SHAKE) LIQD Take 237 mLs by mouth 3 (three) times daily between meals.    Marland Kitchen FERREX 150 150 MG capsule Take 150 mg by mouth daily.    Marland Kitchen gabapentin (NEURONTIN) 300 MG capsule Take 300 mg by mouth 3 (three) times daily.    Marland Kitchen HYDROcodone-acetaminophen (NORCO) 10-325 MG tablet Take 1 tablet by mouth in the morning, at noon, and at bedtime.     . hydrocortisone (ANUSOL-HC) 2.5 % rectal cream Place 1 application rectally 2 (two) times daily as needed (for  rectal bleeding).    . insulin detemir (LEVEMIR FLEXTOUCH) 100 UNIT/ML FlexPen Inject 38 Units into the skin at bedtime. 15 mL 3  . latanoprost (XALATAN) 0.005 % ophthalmic solution Place 1 drop into both eyes at bedtime.    Marland Kitchen loperamide (IMODIUM A-D) 2 MG tablet Take 2 mg by mouth See admin instructions. Take 2 mg by mouth after each loose stool    . loratadine (CLARITIN) 10 MG tablet Take 10 mg by mouth daily.    Marland Kitchen lubiprostone (AMITIZA) 24 MCG capsule Take 1 capsule (24 mcg total) by mouth 2 (two) times daily with a meal. 60 capsule 5  . metoprolol succinate (TOPROL-XL) 25 MG 24 hr tablet Take 1 tablet (25 mg total) by mouth daily. 90 tablet 3  . midodrine (PROAMATINE) 10 MG tablet TAKE (1) TABLET BY MOUTH (3) TIMES DAILY. (Patient taking differently: Take 10 mg by mouth 3 (three) times daily.) 90 tablet 3  . mirabegron ER  (MYRBETRIQ) 25 MG TB24 tablet Take 25 mg by mouth daily.    . mirtazapine (REMERON) 30 MG tablet Take 30 mg by mouth at bedtime.    . ondansetron (ZOFRAN) 4 MG tablet Take 1 tablet (4 mg total) by mouth 4 (four) times daily -  before meals and at bedtime. (Patient taking differently: Take 4 mg by mouth 2 (two) times daily as needed for nausea or vomiting.) 120 tablet 3  . pantoprazole (PROTONIX) 40 MG tablet Take 1 tablet (40 mg total) by mouth 2 (two) times daily before a meal. 270 tablet 3  . risperiDONE (RISPERDAL) 1 MG tablet Take 1 tablet (1 mg total) by mouth in the morning and at bedtime. 56 tablet 3  . simvastatin (ZOCOR) 20 MG tablet Take 20 mg by mouth at bedtime.     Marland Kitchen tiZANidine (ZANAFLEX) 2 MG tablet Take 2 mg by mouth daily.    . TRADJENTA 5 MG TABS tablet TAKE (1) TABLET BY MOUTH ONCE DAILY. (Patient taking differently: Take 5 mg by mouth daily.) 30 tablet 0  . warfarin (COUMADIN) 5 MG tablet Take 1 tablet (5 mg total) by mouth every evening. (Patient taking differently: Take 2.5-5 mg by mouth See admin instructions. Take 5 mg by mouth at 5 PM daily on Sun/Tues/Wed/Thurs/Fri/Sat and 2.5 mg on Mon) 30 tablet 6  . zolpidem (AMBIEN) 10 MG tablet Take 10 mg by mouth at bedtime.    Regino Schultze TEST test strip USE TO CHECK BLOOD SUGAR TWICE DAILY. (Patient taking differently: 1 each by Other route 2 (two) times daily.) 200 strip 2  . iron polysaccharides (NIFEREX) 150 MG capsule Take 1 capsule (150 mg total) by mouth daily. (Patient not taking: Reported on 02/20/2020) 30 capsule 11  . Lancets 28G MISC USE TO CHECK BLOOD SUGAR TWICE DAILY. 100 each 0     Assessment: 75 y.o. female admitted with chest pain, h/o DVT, to continue Coumadin.  INR 2.2 on 1.26 during office visit   Goal of Therapy:  INR 2-3 Monitor platelets by anticoagulation protocol: Yes   Plan:  F/U daily INR  Dewitt Judice, Bronson Curb 02/21/2020,1:18 AM

## 2020-02-21 NOTE — Discharge Summary (Signed)
[facility] Discharge Summary  Patient name: [name] Medical record number: [id number] Date of birth: [birth year] Age: 75 y.o. Gender: female Date of Admission: 02/20/2020  Date of Discharge: 02/21/2020 Admitting Physician: Zola Button, MD  Primary Care Provider: [name], MD Consultants: Pharmacy  Indication for Hospitalization: Noncardiac chest pain  Discharge Diagnoses/Problem List:  1/28 admitted for chest pain and ACS rule out  Disposition: Return to [facility]  Discharge Condition: Improved, stable  Discharge Exam: Copied from physical exam on progress note on day of discharge General: Extremely dry mouth, hypovolemic, NAD HEENT: Cardiovascular: RRR, no murmurs, poor cap refill Respiratory: No increased effort Abdomen: Soft, nontender Extremities: Negative lower extremity edema  Brief Hospital Course:  Patient admitted for left-sided chest wall pain which improved during hospitalization.  Cardiac cause was ruled out.  In the setting of known esophageal stricture, severe GERD, improvement with GI therapy, patient's discomfort is thought to be due to severe indigestion.  She has a history of colonoscopy abnormalities and is overdue for follow-up.  Ambulatory GI referral was placed at discharge. Additionally, patient has history of CVA, A. fib and has been on warfarin chronically.  Patient's INR was 1.9 on day of discharge and was discontinued off warfarin.  Instead, she was started on apixaban 5 mg twice daily.  Issues for Follow Up:  1. Follow-up with GI for likely repeat EGD and colonoscopy. 2. Follow-up with cardiology to further manage A. fib no longer on warfarin. 3. Follow-up with PCP for improved management of diabetes and regular health maintenance.  Patient was on several medications on beers list which have partially been discontinued at discharge.  Would recommend PCP reevaluate this list and consider on prescribing several more  medications as they feel appropriate.  Significant Procedures: None  Significant Labs and Imaging:  Recent Labs  Lab 02/20/20 1539 02/21/20 0325  WBC 5.9 7.7  HGB 10.7* 12.8  HCT 30.9* 36.1  PLT 191 211   Recent Labs  Lab 02/20/20 1539 02/21/20 0325  NA 137 134*  K 3.4* 3.8  CL 103 98  CO2 27 25  GLUCOSE 325* 330*  BUN 9 8  CREATININE 0.61 0.78  CALCIUM 7.4* 8.8*    INR 1.9 Hemoglobin A1c 9.2  Results/Tests Pending at Time of Discharge: None  Discharge Medications:  Allergies as of 02/21/2020      Reactions   Sulfa Antibiotics Rash      Medication List    STOP taking these medications   docusate sodium 100 MG capsule Commonly known as: COLACE   loperamide 2 MG tablet Commonly known as: IMODIUM A-D   simvastatin 20 MG tablet Commonly known as: ZOCOR   warfarin 5 MG tablet Commonly known as: COUMADIN   zolpidem 10 MG tablet Commonly known as: AMBIEN     TAKE these medications   Abilify 2 MG tablet Generic drug: ARIPiprazole Take 1 tablet (2 mg total) by mouth daily. What changed: when to take this   acetaminophen 325 MG tablet Commonly known as: TYLENOL Take 650 mg by mouth 3 (three) times daily as needed for mild pain or moderate pain.   apixaban 5 MG Tabs tablet Commonly known as: ELIQUIS Take 1 tablet (5 mg total) by mouth 2 (two) times daily.   ARTIFICIAL TEARS OP Place 1 drop into both eyes 3 (three) times daily.   atorvastatin 40 MG tablet Commonly known as: LIPITOR Take 1 tablet (40 mg total) by mouth at bedtime.   calcium carbonate  500 MG chewable tablet Commonly known as: TUMS - dosed in mg elemental calcium Chew 1 tablet by mouth 4 (four) times daily as needed for heartburn.   clonazePAM 0.5 MG tablet Commonly known as: KLONOPIN Take 1 tablet (0.5 mg total) by mouth 3 (three) times daily as needed for anxiety. What changed:   when to take this  reasons to take this   diltiazem 120 MG 24 hr capsule Commonly known as:  Cardizem CD Take 1 capsule (120 mg total) by mouth daily.   DULoxetine 60 MG capsule Commonly known as: CYMBALTA Take 120 mg by mouth daily.   EasyMax Test test strip Generic drug: glucose blood USE TO CHECK BLOOD SUGAR TWICE DAILY. What changed: See the new instructions.   feeding supplement (GLUCERNA SHAKE) Liqd Take 237 mLs by mouth 3 (three) times daily between meals.   Ferrex 150 150 MG capsule Generic drug: iron polysaccharides Take 150 mg by mouth daily. What changed: Another medication with the same name was removed. Continue taking this medication, and follow the directions you see here.   gabapentin 300 MG capsule Commonly known as: NEURONTIN Take 1 capsule (300 mg total) by mouth 3 (three) times daily as needed. What changed:   when to take this  reasons to take this   HYDROcodone-acetaminophen 10-325 MG tablet Commonly known as: NORCO Take 1 tablet by mouth 3 (three) times daily as needed. What changed:   when to take this  reasons to take this   hydrocortisone 2.5 % rectal cream Commonly known as: ANUSOL-HC Place 1 application rectally 2 (two) times daily as needed (for rectal bleeding).   Lancets 28G Misc USE TO CHECK BLOOD SUGAR TWICE DAILY.   latanoprost 0.005 % ophthalmic solution Commonly known as: XALATAN Place 1 drop into both eyes at bedtime.   Levemir FlexTouch 100 UNIT/ML FlexPen Generic drug: insulin detemir Inject 38 Units into the skin at bedtime.   loratadine 10 MG tablet Commonly known as: CLARITIN Take 10 mg by mouth daily.   lubiprostone 24 MCG capsule Commonly known as: AMITIZA Take 1 capsule (24 mcg total) by mouth 2 (two) times daily with a meal.   metoprolol succinate 25 MG 24 hr tablet Commonly known as: TOPROL-XL Take 1 tablet (25 mg total) by mouth daily.   midodrine 10 MG tablet Commonly known as: PROAMATINE TAKE (1) TABLET BY MOUTH (3) TIMES DAILY. What changed: See the new instructions.   mirabegron ER 25 MG  Tb24 tablet Commonly known as: MYRBETRIQ Take 25 mg by mouth daily.   mirtazapine 30 MG tablet Commonly known as: REMERON Take 30 mg by mouth at bedtime.   ondansetron 4 MG tablet Commonly known as: ZOFRAN Take 1 tablet (4 mg total) by mouth 4 (four) times daily -  before meals and at bedtime. What changed:   when to take this  reasons to take this   pantoprazole 40 MG tablet Commonly known as: PROTONIX Take 1 tablet (40 mg total) by mouth daily. What changed: when to take this   polyethylene glycol 17 g packet Commonly known as: MIRALAX / GLYCOLAX Take 17 g by mouth daily. Start taking on: February 22, 2020   risperiDONE 1 MG tablet Commonly known as: RISPERDAL Take 1 tablet (1 mg total) by mouth in the morning and at bedtime.   senna 8.6 MG Tabs tablet Commonly known as: SENOKOT Take 1 tablet (8.6 mg total) by mouth daily. Start taking on: February 22, 2020   tiZANidine 2 MG tablet  Commonly known as: ZANAFLEX Take 1 tablet (2 mg total) by mouth daily as needed for muscle spasms. What changed:   when to take this  reasons to take this   Tradjenta 5 MG Tabs tablet Generic drug: linagliptin TAKE (1) TABLET BY MOUTH ONCE DAILY. What changed: See the new instructions.   Vitamin D3 50 MCG (2000 UT) Tabs Take 2,000 Units by mouth daily.       Discharge Instructions: Please refer to Patient Instructions section of EMR for full details.  Patient was counseled important signs and symptoms that should prompt return to medical care, changes in medications, dietary instructions, activity restrictions, and follow up appointments.   Follow-Up Appointments:  Follow-up Information    [name], MD. Schedule an appointment as soon as possible for a visit in 1 week(s).   Specialty: Internal Medicine Contact information: [place] [address] [phone]        Satira Sark, MD .   Specialty: Cardiology Contact information: [place] [address] [phone]               Richarda Osmond, DO 02/21/2020, 12:30 PM PGY-3, [place]

## 2020-02-21 NOTE — Progress Notes (Signed)
[facility] for Warfarin  Indication: History of VTE   Allergies  Allergen Reactions  . Sulfa Antibiotics Rash    Patient Measurements:   Heparin Dosing Weight:   Vital Signs: Temp: 98 F (36.7 C) (01/29 1000) Temp Source: Oral (01/29 1000) BP: 142/71 (01/29 1000) Pulse Rate: 89 (01/29 1000)  Labs: Recent Labs    02/18/20 1054 02/20/20 1539 02/20/20 1733 02/21/20 0325  HGB  --  10.7*  --  12.8  HCT  --  30.9*  --  36.1  PLT  --  191  --  211  LABPROT  --   --   --  20.9*  INR 2.2  --   --  1.9*  CREATININE  --  0.61  --  0.78  TROPONINIHS  --  10 12  --     CrCl cannot be calculated (Unknown ideal weight.).   Medical History: Past Medical History:  Diagnosis Date  . Anxiety   . Arthritis   . COVID-19 virus infection    COVID-19+ approx 01/24/19; asymptomatic course with full recovery  . Dependence on wheelchair    pivot/transfers  . Depression    History of psychosis and previous suicide attempt  . DVT, lower extremity, recurrent (HCC)    Long-term Coumadin per Dr. Legrand Rams  . Essential hypertension   . GERD (gastroesophageal reflux disease)   . Hemiplegia ([place]) 2010   Left side  . History of stroke    Acute infarct and right cerebral white matter small vessel disease 12/10  . Leg DVT (deep venous thromboembolism), acute ([place]) 2006  . Schizophrenia ([place])   . Stroke [facility])    left sided weakness  . Type 2 diabetes mellitus (HCC)     Medications:  Scheduled:  . alum & mag hydroxide-simeth  15 mL Oral BID  . ARIPiprazole  2 mg Oral QHS  . atorvastatin  40 mg Oral QHS  . diltiazem  120 mg Oral Daily  . DULoxetine  120 mg Oral Daily  . feeding supplement (GLUCERNA SHAKE)  237 mL Oral TID BM  . gabapentin  300 mg Oral TID  . insulin aspart  0-5 Units Subcutaneous QHS  . insulin aspart  0-9 Units Subcutaneous TID WC  . insulin detemir  19 Units Subcutaneous Daily  . iron polysaccharides  150 mg Oral Daily  .  latanoprost  1 drop Both Eyes QHS  . loratadine  10 mg Oral Daily  . lubiprostone  24 mcg Oral BID WC  . metoprolol succinate  25 mg Oral Daily  . mirabegron ER  25 mg Oral Daily  . mirtazapine  30 mg Oral QHS  . pantoprazole  40 mg Oral BID AC  . polyethylene glycol  17 g Oral Daily  . risperiDONE  1 mg Oral BID  . senna  1 tablet Oral Daily  . tiZANidine  2 mg Oral Daily  . warfarin  5 mg Oral ONCE-1600  . Warfarin - Pharmacist Dosing Inpatient   Does not apply q1600    Assessment: Patient is a 9 yof that is being admitted for and ACS rule out. Patient has a history of afib as well as a history of DVT in the past. Pharmacy has been asked to dose he home warfarin while inpatient. PTA schedule was 5mg  daily except 2.5mg  on Monday  Goal of Therapy:  INR 2-3 Monitor platelets by anticoagulation protocol: Yes   Plan:  - INR just below goal at  1.9 - Continue home regimen at this time and will give Warfarin 5mg  PO x 1 dose this evening  - Trend daily INR while inpatient at this time  - Monitor patient for s/s of bleeding and cbc   Duanne Limerick PharmD. BCPS 02/21/2020,10:38 AM

## 2020-02-21 NOTE — Discharge Instructions (Signed)
Your chest pain was evaluated and was not related to cardiac cause. It is likely that you have a very severe digestive problems that contribute to your pain. I have placed a referral to the GI doctor and I would recommend you follow-up with them very closely for further evaluation and treatment of your problems with digestion and abdominal pain.  Continue to take your dietary supplements daily. I have discontinued your warfarin blood thinner.  Instead, you are now on Eliquis which is a pill you will take twice a day and you do not need to have regular INR checks with this medication. Please make an appointment with your primary care doctor within the next week to follow-up on your blood work. We also changed your cholesterol medication to a stronger treatment.  Apixaban Tablets What is this medicine? APIXABAN (a PIX a ban) is an anticoagulant (blood thinner). It is used to lower the chance of stroke in people with a medical condition called atrial fibrillation. It is also used to treat or prevent blood clots in the lungs or in the veins. This medicine may be used for other purposes; ask your health care provider or pharmacist if you have questions. COMMON BRAND NAME(S): Eliquis What should I tell my health care provider before I take this medicine? They need to know if you have any of these conditions:  antiphospholipid antibody syndrome  bleeding disorders  bleeding in the brain  blood in your stools (black or tarry stools) or if you have blood in your vomit  history of blood clots  history of stomach bleeding  kidney disease  liver disease  mechanical heart valve  an unusual or allergic reaction to apixaban, other medicines, foods, dyes, or preservatives  pregnant or trying to get pregnant  breast-feeding How should I use this medicine? Take this medicine by mouth with a glass of water. Follow the directions on the prescription label. You can take it with or without food. If  it upsets your stomach, take it with food. Take your medicine at regular intervals. Do not take it more often than directed. Do not stop taking except on your doctor's advice. Stopping this medicine may increase your risk of a blood clot. Be sure to refill your prescription before you run out of medicine. Talk to your pediatrician regarding the use of this medicine in children. Special care may be needed. Overdosage: If you think you have taken too much of this medicine contact a poison control center or emergency room at once. NOTE: This medicine is only for you. Do not share this medicine with others. What if I miss a dose? If you miss a dose, take it as soon as you can. If it is almost time for your next dose, take only that dose. Do not take double or extra doses. What may interact with this medicine? This medicine may interact with the following:  aspirin and aspirin-like medicines  certain medicines for fungal infections like ketoconazole and itraconazole  certain medicines for seizures like carbamazepine and phenytoin  certain medicines that treat or prevent blood clots like warfarin, enoxaparin, and dalteparin  clarithromycin  NSAIDs, medicines for pain and inflammation, like ibuprofen or naproxen  rifampin  ritonavir  St. John's wort This list may not describe all possible interactions. Give your health care provider a list of all the medicines, herbs, non-prescription drugs, or dietary supplements you use. Also tell them if you smoke, drink alcohol, or use illegal drugs. Some items may interact with your  medicine. What should I watch for while using this medicine? Visit your healthcare professional for regular checks on your progress. You may need blood work done while you are taking this medicine. Your condition will be monitored carefully while you are receiving this medicine. It is important not to miss any appointments. Avoid sports and activities that might cause injury  while you are using this medicine. Severe falls or injuries can cause unseen bleeding. Be careful when using sharp tools or knives. Consider using an Copy. Take special care brushing or flossing your teeth. Report any injuries, bruising, or red spots on the skin to your healthcare professional. If you are going to need surgery or other procedure, tell your healthcare professional that you are taking this medicine. Wear a medical ID bracelet or chain. Carry a card that describes your disease and details of your medicine and dosage times. What side effects may I notice from receiving this medicine? Side effects that you should report to your doctor or health care professional as soon as possible:  allergic reactions like skin rash, itching or hives, swelling of the face, lips, or tongue  signs and symptoms of bleeding such as bloody or black, tarry stools; red or dark-brown urine; spitting up blood or brown material that looks like coffee grounds; red spots on the skin; unusual bruising or bleeding from the eye, gums, or nose  signs and symptoms of a blood clot such as chest pain; shortness of breath; pain, swelling, or warmth in the leg  signs and symptoms of a stroke such as changes in vision; confusion; trouble speaking or understanding; severe headaches; sudden numbness or weakness of the face, arm or leg; trouble walking; dizziness; loss of coordination This list may not describe all possible side effects. Call your doctor for medical advice about side effects. You may report side effects to FDA at 1-800-FDA-1088. Where should I keep my medicine? Keep out of the reach of children. Store at room temperature between 20 and 25 degrees C (68 and 77 degrees F). Throw away any unused medicine after the expiration date. NOTE: This sheet is a summary. It may not cover all possible information. If you have questions about this medicine, talk to your doctor, pharmacist, or health care  provider.  2021 Elsevier/Gold Standard (2019-11-19 16:54:11)

## 2020-02-21 NOTE — ED Notes (Signed)
Pt son called for an update please call [phone]

## 2020-02-23 ENCOUNTER — Telehealth: Payer: Self-pay | Admitting: *Deleted

## 2020-02-23 ENCOUNTER — Encounter: Payer: Self-pay | Admitting: Nurse Practitioner

## 2020-02-23 ENCOUNTER — Other Ambulatory Visit: Payer: Self-pay

## 2020-02-23 ENCOUNTER — Ambulatory Visit (INDEPENDENT_AMBULATORY_CARE_PROVIDER_SITE_OTHER): Payer: Medicare Other | Admitting: Nurse Practitioner

## 2020-02-23 VITALS — BP 94/61

## 2020-02-23 DIAGNOSIS — E782 Mixed hyperlipidemia: Secondary | ICD-10-CM | POA: Diagnosis not present

## 2020-02-23 DIAGNOSIS — I4891 Unspecified atrial fibrillation: Secondary | ICD-10-CM | POA: Diagnosis not present

## 2020-02-23 DIAGNOSIS — F209 Schizophrenia, unspecified: Secondary | ICD-10-CM | POA: Diagnosis not present

## 2020-02-23 DIAGNOSIS — E1159 Type 2 diabetes mellitus with other circulatory complications: Secondary | ICD-10-CM | POA: Diagnosis not present

## 2020-02-23 DIAGNOSIS — H6690 Otitis media, unspecified, unspecified ear: Secondary | ICD-10-CM | POA: Diagnosis not present

## 2020-02-23 DIAGNOSIS — R296 Repeated falls: Secondary | ICD-10-CM | POA: Diagnosis not present

## 2020-02-23 DIAGNOSIS — I10 Essential (primary) hypertension: Secondary | ICD-10-CM

## 2020-02-23 DIAGNOSIS — E1151 Type 2 diabetes mellitus with diabetic peripheral angiopathy without gangrene: Secondary | ICD-10-CM | POA: Diagnosis not present

## 2020-02-23 DIAGNOSIS — I69354 Hemiplegia and hemiparesis following cerebral infarction affecting left non-dominant side: Secondary | ICD-10-CM | POA: Diagnosis not present

## 2020-02-23 DIAGNOSIS — Z5181 Encounter for therapeutic drug level monitoring: Secondary | ICD-10-CM

## 2020-02-23 LAB — POCT GLYCOSYLATED HEMOGLOBIN (HGB A1C): HbA1c, POC (controlled diabetic range): 9.2 % — AB (ref 0.0–7.0)

## 2020-02-23 NOTE — Patient Instructions (Signed)
Diabetes Mellitus and Nutrition, Adult When you have diabetes, or diabetes mellitus, it is very important to have healthy eating habits because your blood sugar (glucose) levels are greatly affected by what you eat and drink. Eating healthy foods in the right amounts, at about the same times every day, can help you:  Control your blood glucose.  Lower your risk of heart disease.  Improve your blood pressure.  Reach or maintain a healthy weight. What can affect my meal plan? Every person with diabetes is different, and each person has different needs for a meal plan. Your health care provider may recommend that you work with a dietitian to make a meal plan that is best for you. Your meal plan may vary depending on factors such as:  The calories you need.  The medicines you take.  Your weight.  Your blood glucose, blood pressure, and cholesterol levels.  Your activity level.  Other health conditions you have, such as heart or kidney disease. How do carbohydrates affect me? Carbohydrates, also called carbs, affect your blood glucose level more than any other type of food. Eating carbs naturally raises the amount of glucose in your blood. Carb counting is a method for keeping track of how many carbs you eat. Counting carbs is important to keep your blood glucose at a healthy level, especially if you use insulin or take certain oral diabetes medicines. It is important to know how many carbs you can safely have in each meal. This is different for every person. Your dietitian can help you calculate how many carbs you should have at each meal and for each snack. How does alcohol affect me? Alcohol can cause a sudden decrease in blood glucose (hypoglycemia), especially if you use insulin or take certain oral diabetes medicines. Hypoglycemia can be a life-threatening condition. Symptoms of hypoglycemia, such as sleepiness, dizziness, and confusion, are similar to symptoms of having too much  alcohol.  Do not drink alcohol if: ? Your health care provider tells you not to drink. ? You are pregnant, may be pregnant, or are planning to become pregnant.  If you drink alcohol: ? Do not drink on an empty stomach. ? Limit how much you use to:  0-1 drink a day for women.  0-2 drinks a day for men. ? Be aware of how much alcohol is in your drink. In the [place] one drink equals one 12 oz bottle of beer (355 mL), one 5 oz glass of wine (148 mL), or one 1 oz glass of hard liquor (44 mL). ? Keep yourself hydrated with water, diet soda, or unsweetened iced tea.  Keep in mind that regular soda, juice, and other mixers may contain a lot of sugar and must be counted as carbs. What are tips for following this plan? Reading food labels  Start by checking the serving size on the "Nutrition Facts" label of packaged foods and drinks. The amount of calories, carbs, fats, and other nutrients listed on the label is based on one serving of the item. Many items contain more than one serving per package.  Check the total grams (g) of carbs in one serving. You can calculate the number of servings of carbs in one serving by dividing the total carbs by 15. For example, if a food has 30 g of total carbs per serving, it would be equal to 2 servings of carbs.  Check the number of grams (g) of saturated fats and trans fats in one serving. Choose foods that have   a low amount or none of these fats.  Check the number of milligrams (mg) of salt (sodium) in one serving. Most people should limit total sodium intake to less than 2,300 mg per day.  Always check the nutrition information of foods labeled as "low-fat" or "nonfat." These foods may be higher in added sugar or refined carbs and should be avoided.  Talk to your dietitian to identify your daily goals for nutrients listed on the label. Shopping  Avoid buying canned, pre-made, or processed foods. These foods tend to be high in fat, sodium, and added  sugar.  Shop around the outside edge of the grocery store. This is where you will most often find fresh fruits and vegetables, bulk grains, fresh meats, and fresh dairy. Cooking  Use low-heat cooking methods, such as baking, instead of high-heat cooking methods like deep frying.  Cook using healthy oils, such as olive, canola, or sunflower oil.  Avoid cooking with butter, cream, or high-fat meats. Meal planning  Eat meals and snacks regularly, preferably at the same times every day. Avoid going long periods of time without eating.  Eat foods that are high in fiber, such as fresh fruits, vegetables, beans, and whole grains. Talk with your dietitian about how many servings of carbs you can eat at each meal.  Eat 4-6 oz (112-168 g) of lean protein each day, such as lean meat, chicken, fish, eggs, or tofu. One ounce (oz) of lean protein is equal to: ? 1 oz (28 g) of meat, chicken, or fish. ? 1 egg. ?  cup (62 g) of tofu.  Eat some foods each day that contain healthy fats, such as avocado, nuts, seeds, and fish.   What foods should I eat? Fruits Berries. Apples. Oranges. Peaches. Apricots. Plums. Grapes. Mango. Papaya. Pomegranate. Kiwi. Cherries. Vegetables Lettuce. Spinach. Leafy greens, including kale, chard, collard greens, and mustard greens. Beets. Cauliflower. Cabbage. Broccoli. Carrots. Green beans. Tomatoes. Peppers. Onions. Cucumbers. Brussels sprouts. Grains Whole grains, such as whole-wheat or whole-grain bread, crackers, tortillas, cereal, and pasta. Unsweetened oatmeal. Quinoa. Brown or wild rice. Meats and other proteins Seafood. Poultry without skin. Lean cuts of poultry and beef. Tofu. Nuts. Seeds. Dairy Low-fat or fat-free dairy products such as milk, yogurt, and cheese. The items listed above may not be a complete list of foods and beverages you can eat. Contact a dietitian for more information. What foods should I avoid? Fruits Fruits canned with  syrup. Vegetables Canned vegetables. Frozen vegetables with butter or cream sauce. Grains Refined white flour and flour products such as bread, pasta, snack foods, and cereals. Avoid all processed foods. Meats and other proteins Fatty cuts of meat. Poultry with skin. Breaded or fried meats. Processed meat. Avoid saturated fats. Dairy Full-fat yogurt, cheese, or milk. Beverages Sweetened drinks, such as soda or iced tea. The items listed above may not be a complete list of foods and beverages you should avoid. Contact a dietitian for more information. Questions to ask a health care provider  Do I need to meet with a diabetes educator?  Do I need to meet with a dietitian?  What number can I call if I have questions?  When are the best times to check my blood glucose? Where to find more information:  American Diabetes Association: diabetes.org  Academy of Nutrition and Dietetics: www.eatright.org  National Institute of Diabetes and Digestive and Kidney Diseases: www.niddk.nih.gov  Association of Diabetes Care and Education Specialists: www.diabeteseducator.org Summary  It is important to have healthy eating   habits because your blood sugar (glucose) levels are greatly affected by what you eat and drink.  A healthy meal plan will help you control your blood glucose and maintain a healthy lifestyle.  Your health care provider may recommend that you work with a dietitian to make a meal plan that is best for you.  Keep in mind that carbohydrates (carbs) and alcohol have immediate effects on your blood glucose levels. It is important to count carbs and to use alcohol carefully. This information is not intended to replace advice given to you by your health care provider. Make sure you discuss any questions you have with your health care provider. Document Revised: 12/17/2018 Document Reviewed: 12/17/2018 Elsevier Patient Education  2021 Elsevier Inc.  

## 2020-02-23 NOTE — Telephone Encounter (Signed)
[name] from [facility] calling in regards to patient being changed to Eliquis from Warfarin during her last ED visit at [facility] on 02/20/2020.  Wants to confirm that you are okay with the change & does she need earlier follow up than April.    [name] or [place] [phone]

## 2020-02-23 NOTE — Telephone Encounter (Signed)
Tammy with [facility] notified.  Will fax lab order for [facility] to:  [phone].

## 2020-02-23 NOTE — Telephone Encounter (Signed)
I reviewed the chart, she was observed on the internal medicine service at [facility] at which point switch was made from Coumadin to Eliquis.  She has a history of PAF, prior DVT, also stroke.  This is a reasonable change, does not need to move up her follow-up visit, but would make sure that she gets a CBC and BMET for the encounter in April.

## 2020-02-23 NOTE — Progress Notes (Signed)
02/23/2020  Endocrinology follow-up note  -She was assisted by her caregiver from nursing home.     Subjective:    Patient ID: [name], female    DOB: 06-Oct-1945,    Past Medical History:  Diagnosis Date  . Anxiety   . Arthritis   . COVID-19 virus infection    COVID-19+ approx 01/24/19; asymptomatic course with full recovery  . Dependence on wheelchair    pivot/transfers  . Depression    History of psychosis and previous suicide attempt  . DVT, lower extremity, recurrent (HCC)    Long-term Coumadin per Dr. Legrand Rams  . Essential hypertension   . GERD (gastroesophageal reflux disease)   . Hemiplegia ([name]) 2010   Left side  . History of stroke    Acute infarct and right cerebral white matter small vessel disease 12/10  . Leg DVT (deep venous thromboembolism), acute ([place]) 2006  . Schizophrenia ([place])   . Stroke [facility])    left sided weakness  . Type 2 diabetes mellitus ([name])    Past Surgical History:  Procedure Laterality Date  . BACK SURGERY    . BIOPSY N/A 11/24/2014   Procedure: BIOPSY;  Surgeon: Danie Binder, MD;  Location: [facility];  Service: Endoscopy;  Laterality: N/A;  . BIOPSY  05/17/2016   Procedure: BIOPSY;  Surgeon: Danie Binder, MD;  Location: [facility];  Service: Endoscopy;;  gastric biopsy  . COLONOSCOPY WITH PROPOFOL N/A 11/24/2014   Dr. Rudie Meyer polyps removed/moderate sized internal hemorrhoids, tubular adenomas. Next surveillance in 3 years  . ESOPHAGOGASTRODUODENOSCOPY (EGD) WITH PROPOFOL N/A 11/24/2014   Dr. Clayburn Pert HH/patent stricture at the gastroesophageal junction, mild non-erosive gastritis, path negative for H.pylori or celiac sprue  . ESOPHAGOGASTRODUODENOSCOPY (EGD) WITH PROPOFOL N/A 05/17/2016   Procedure: ESOPHAGOGASTRODUODENOSCOPY (EGD) WITH PROPOFOL;  Surgeon: Danie Binder, MD;  Location: [facility];  Service: Endoscopy;  Laterality: N/A;  12:45pm  . GIVENS CAPSULE STUDY N/A 12/11/2014   MULTILPLE EROSION IN the stomach WITH  ACTIVE OOZING. OCCASIONAL EROSIONS AND RARE ULCER SEEN IN PROXIMAL SMALL BOWEL . No masses or AVMs SEEN. NO OLD BLOOD OR FRESH BLOOD SEEN.   . POLYPECTOMY N/A 11/24/2014   Procedure: POLYPECTOMY;  Surgeon: Danie Binder, MD;  Location: [facility];  Service: Endoscopy;  Laterality: N/A;  . SAVORY DILATION N/A 05/17/2016   Procedure: SAVORY DILATION;  Surgeon: Danie Binder, MD;  Location: [facility];  Service: Endoscopy;  Laterality: N/A;   Family History  Problem Relation Age of Onset  . Hypertension Mother   . Colon cancer Neg Hx     Social History   Socioeconomic History  . Marital status: Widowed    Spouse name: Not on file  . Number of children: Not on file  . Years of education: Not on file  . Highest education level: Not on file  Occupational History  . Not on file  Tobacco Use  . Smoking status: Former Smoker    Packs/day: 0.25    Years: 20.00    Pack years: 5.00    Types: Cigarettes    Quit date: 01/24/1995    Years since quitting: 25.0  . Smokeless tobacco: Never Used  Vaping Use  . Vaping Use: Never used  Substance and Sexual Activity  . Alcohol use: No    Alcohol/week: 0.0 standard drinks  . Drug use: No  . Sexual activity: Never    Birth control/protection: Post-menopausal  Other Topics Concern  . Not on file  Social History Narrative  . Not on file   Social Determinants of Health   Financial Resource Strain: Not on file  Food Insecurity: Not on file  Transportation Needs: Not on file  Physical Activity: Not on file  Stress: Not on file  Social Connections: Not on file   Outpatient Encounter Medications as of 02/23/2020  Medication Sig  . ABILIFY 2 MG tablet Take 1 tablet (2 mg total) by mouth daily. (Patient taking differently: Take 2 mg by mouth at bedtime.)  . acetaminophen (TYLENOL) 325 MG tablet Take 650 mg by mouth 3 (three) times daily as needed for mild pain or moderate pain.   . calcium carbonate (TUMS - DOSED IN MG ELEMENTAL CALCIUM) 500 MG  chewable tablet Chew 1 tablet by mouth 4 (four) times daily as needed for heartburn.  . Carboxymethylcellulose Sodium (ARTIFICIAL TEARS OP) Place 1 drop into both eyes 3 (three) times daily.  . Cholecalciferol (VITAMIN D3) 50 MCG (2000 UT) TABS Take 2,000 Units by mouth daily.  . clonazePAM (KLONOPIN) 0.5 MG tablet Take 1 tablet (0.5 mg total) by mouth 3 (three) times daily as needed for anxiety.  Marland Kitchen diltiazem (CARDIZEM CD) 120 MG 24 hr capsule Take 1 capsule (120 mg total) by mouth daily.  Marland Kitchen docusate sodium (COLACE) 100 MG capsule Take 200 mg by mouth daily.  . DULoxetine (CYMBALTA) 60 MG capsule Take 60 mg by mouth daily.  Regino Schultze TEST test strip USE TO CHECK BLOOD SUGAR TWICE DAILY. (Patient taking differently: 1 each by Other route 2 (two) times daily.)  . feeding supplement, GLUCERNA SHAKE, (GLUCERNA SHAKE) LIQD Take 237 mLs by mouth 3 (three) times daily between meals.  Marland Kitchen FERREX 150 150 MG capsule Take 150 mg by mouth daily.  Marland Kitchen gabapentin (NEURONTIN) 300 MG capsule Take 1 capsule (300 mg total) by mouth 3 (three) times daily as needed.  Marland Kitchen HYDROcodone-acetaminophen (NORCO) 10-325 MG tablet Take 1 tablet by mouth 3 (three) times daily as needed.  . hydrocortisone (ANUSOL-HC) 2.5 % rectal cream Place 1 application rectally 2 (two) times daily as needed (for rectal bleeding).  . insulin detemir (LEVEMIR FLEXTOUCH) 100 UNIT/ML FlexPen Inject 38 Units into the skin at bedtime.  . Lancets 28G MISC USE TO CHECK BLOOD SUGAR TWICE DAILY.  Marland Kitchen latanoprost (XALATAN) 0.005 % ophthalmic solution Place 1 drop into both eyes at bedtime.  Marland Kitchen loratadine (CLARITIN) 10 MG tablet Take 10 mg by mouth daily.  Marland Kitchen lubiprostone (AMITIZA) 24 MCG capsule Take 1 capsule (24 mcg total) by mouth 2 (two) times daily with a meal.  . metoprolol succinate (TOPROL-XL) 25 MG 24 hr tablet Take 1 tablet (25 mg total) by mouth daily.  . midodrine (PROAMATINE) 10 MG tablet TAKE (1) TABLET BY MOUTH (3) TIMES DAILY. (Patient taking  differently: Take 10 mg by mouth 3 (three) times daily.)  . mirabegron ER (MYRBETRIQ) 25 MG TB24 tablet Take 25 mg by mouth daily.  . mirtazapine (REMERON) 30 MG tablet Take 30 mg by mouth at bedtime.  . ondansetron (ZOFRAN) 4 MG tablet Take 1 tablet (4 mg total) by mouth 4 (four) times daily -  before meals and at bedtime. (Patient taking differently: Take 4 mg by mouth 2 (two) times daily as needed for nausea or vomiting.)  . pantoprazole (PROTONIX) 40 MG tablet Take 1 tablet (40 mg total) by mouth daily.  . polyethylene glycol (MIRALAX / GLYCOLAX) 17 g packet Take 17 g by mouth daily.  . risperiDONE (RISPERDAL) 1 MG tablet  Take 1 tablet (1 mg total) by mouth in the morning and at bedtime.  . simvastatin (ZOCOR) 20 MG tablet Take 20 mg by mouth daily.  Marland Kitchen tiZANidine (ZANAFLEX) 2 MG tablet Take 1 tablet (2 mg total) by mouth daily as needed for muscle spasms.  . TRADJENTA 5 MG TABS tablet TAKE (1) TABLET BY MOUTH ONCE DAILY. (Patient taking differently: Take 5 mg by mouth daily.)  . zolpidem (AMBIEN) 10 MG tablet Take 10 mg by mouth at bedtime as needed for sleep.  Marland Kitchen apixaban (ELIQUIS) 5 MG TABS tablet Take 1 tablet (5 mg total) by mouth 2 (two) times daily. (Patient not taking: Reported on 02/23/2020)  . atorvastatin (LIPITOR) 40 MG tablet Take 1 tablet (40 mg total) by mouth at bedtime. (Patient not taking: Reported on 02/23/2020)  . senna (SENOKOT) 8.6 MG TABS tablet Take 1 tablet (8.6 mg total) by mouth daily. (Patient not taking: Reported on 02/23/2020)   No facility-administered encounter medications on file as of 02/23/2020.   ALLERGIES: Allergies  Allergen Reactions  . Sulfa Antibiotics Rash   VACCINATION STATUS: There is no immunization history for the selected administration types on file for this patient.  Diabetes She presents for her follow-up diabetic visit. She has type 2 diabetes mellitus. Onset time: She was diagnosed at approximate age of 60 years. Her disease course has been  improving. There are no hypoglycemic associated symptoms. Pertinent negatives for hypoglycemia include no confusion, pallor or seizures. Associated symptoms include fatigue, polydipsia and polyuria. Pertinent negatives for diabetes include no polyphagia. There are no hypoglycemic complications. Symptoms are improving. Diabetic complications include a CVA. Risk factors for coronary artery disease include dyslipidemia, diabetes mellitus, obesity, sedentary lifestyle and hypertension. Current diabetic treatment includes oral agent (monotherapy) and insulin injections. She is compliant with treatment all of the time. Her weight is stable. She is following a generally unhealthy diet. When asked about meal planning, she reported none. She has not had a previous visit with a dietitian. She never participates in exercise. Her home blood glucose trend is decreasing steadily. Her breakfast blood glucose range is generally 140-180 mg/dl. Her bedtime blood glucose range is generally >200 mg/dl. (She presents today, accompanied by her Aide at the nursing home, with her logs showing improved, yet still above target fasting and postprandial glycemic profile.  Her POCT A1c today is 9.2%, improved from previous visit of 10.6%.  There are no documented or reported episodes of hypoglycemia.  She has had recent hospitalization for pneumonia, still recovering.) An ACE inhibitor/angiotensin II receptor blocker is not being taken. She does not see a podiatrist.Eye exam is current.  Hypertension This is a chronic problem. The current episode started more than 1 year ago. The problem has been gradually improving since onset. The problem is controlled. Pertinent negatives include no palpitations or shortness of breath. There are no associated agents to hypertension. Risk factors for coronary artery disease include dyslipidemia, diabetes mellitus and sedentary lifestyle. Past treatments include beta blockers. The current treatment provides  mild improvement. There are no compliance problems.  Hypertensive end-organ damage includes CVA.  Hyperlipidemia This is a chronic problem. The current episode started more than 1 year ago. The problem is controlled. Recent lipid tests were reviewed and are normal. Exacerbating diseases include diabetes. Factors aggravating her hyperlipidemia include beta blockers. Pertinent negatives include no shortness of breath. Current antihyperlipidemic treatment includes statins. The current treatment provides moderate improvement of lipids. There are no compliance problems.  Risk factors for coronary artery  disease include diabetes mellitus, dyslipidemia, hypertension, obesity and a sedentary lifestyle.    Review of systems  Constitutional: + Minimally fluctuating body weight,  current There is no height or weight on file to calculate BMI. , + fatigue, no subjective hyperthermia, no subjective hypothermia Eyes: no blurry vision, no xerophthalmia ENT: no sore throat, no nodules palpated in throat, no dysphagia/odynophagia, no hoarseness Cardiovascular: no chest pain, no shortness of breath, no palpitations, no leg swelling Respiratory: no cough, no shortness of breath Gastrointestinal: no nausea/vomiting/diarrhea Musculoskeletal: no muscle/joint aches, essentially WC bound due to previous CVA Skin: no rashes, no hyperemia Neurological: no tremors, no numbness, no tingling, no dizziness Psychiatric: no depression, no anxiety    Objective:    BP 94/61 (BP Location: Right Arm, Patient Position: Sitting)   Wt Readings from Last 3 Encounters:  11/26/19 127 lb 3.2 oz (57.7 kg)  11/11/19 128 lb 3.2 oz (58.2 kg)  11/06/19 125 lb (56.7 kg)    BP Readings from Last 3 Encounters:  02/23/20 94/61  02/21/20 (!) 146/71  11/26/19 (!) 83/53     Physical Exam- Limited  Constitutional:  There is no height or weight on file to calculate BMI. , not in acute distress, normal state of mind Eyes:  EOMI, no  exophthalmos Neck: Supple Cardiovascular: RRR, + murmer, rubs, or gallops, no edema Respiratory: Adequate breathing efforts, no crackles, rales, rhonchi, or wheezing Musculoskeletal: no gross deformities, no gross restriction of joint movements, essentially WC bound due to previous CVA Skin:  no rashes, no hyperemia Neurological: no tremor with outstretched hands    Results for orders placed or performed in visit on 02/23/20  HgB A1c  Result Value Ref Range   Hemoglobin A1C     HbA1c POC (<> result, manual entry)     HbA1c, POC (prediabetic range)     HbA1c, POC (controlled diabetic range) 9.2 (A) 0.0 - 7.0 %   Complete Blood Count (Most recent): Lab Results  Component Value Date   WBC 7.7 02/21/2020   HGB 12.8 02/21/2020   HCT 36.1 02/21/2020   MCV 86.0 02/21/2020   PLT 211 02/21/2020   Lab Results  Component Value Date   NA 134 (L) 02/21/2020   K 3.8 02/21/2020   CL 98 02/21/2020   CO2 25 02/21/2020   BUN 8 02/21/2020   CREATININE 0.78 02/21/2020   Diabetic Labs (most recent): Lab Results  Component Value Date   HGBA1C 9.2 (A) 02/23/2020   HGBA1C 9.2 (H) 02/21/2020   HGBA1C 10.6 (H) 10/05/2019   Lipid Panel     Component Value Date/Time   CHOL 151 10/05/2019 0457   TRIG 149 10/05/2019 0457   HDL 40 (L) 10/05/2019 0457   CHOLHDL 3.8 10/05/2019 0457   VLDL 30 10/05/2019 0457   LDLCALC 81 10/05/2019 0457     Assessment & Plan:   1) Type 2 diabetes mellitus with vascular disease ([place])  Her diabetes is complicated by recurrent CVA.  She presents today, accompanied by her Aide at the nursing home, with her logs showing improved, yet still above target fasting and postprandial glycemic profile.  Her POCT A1c today is 9.2%, improved from previous visit of 10.6%.  There are no documented or reported episodes of hypoglycemia.  She has had recent hospitalization for pneumonia, still recovering.  - Patient remains at a high risk for more acute and chronic  complications of diabetes which include CAD, CVA, CKD, retinopathy, and neuropathy. These are all discussed in detail with  the patient.  - Nutritional counseling repeated at each appointment due to patients tendency to fall back in to old habits.  - The patient admits there is a room for improvement in their diet and drink choices. -  Suggestion is made for the patient to avoid simple carbohydrates from their diet including Cakes, Sweet Desserts / Pastries, Ice Cream, Soda (diet and regular), Sweet Tea, Candies, Chips, Cookies, Sweet Pastries,  Store Bought Juices, Alcohol in Excess of  1-2 drinks a day, Artificial Sweeteners, Coffee Creamer, and "Sugar-free" Products. This will help patient to have stable blood glucose profile and potentially avoid unintended weight gain.   - I encouraged the patient to switch to  unprocessed or minimally processed complex starch and increased protein intake (animal or plant source), fruits, and vegetables.   - Patient is advised to stick to a routine mealtimes to eat 3 meals  a day and avoid unnecessary snacks ( to snack only to correct hypoglycemia).  - I have approached patient with the following individualized plan to manage diabetes and patient agrees.  -Based on her response to adjustment of her basal insulin and adding Tradjenta, she will not need prandial insulin for now.    -She is advised to increase her Levemir to 42 units SQ daily at bedtime and continue Tradjenta 5 mg po daily.  -She is encouraged to continue monitoring blood glucose twice daily, before breakfast and before bed, and to call the clinic if she has readings less than 70 or greater than 300 for 3 tests in a row.  - she is not a suitable candidate for metformin nor SGLT2 inhibitor therapy.    - Patient specific target  for A1c; LDL, HDL, Triglycerides, and  Waist Circumference were discussed in detail.  2) BP/HTN:  Her blood pressure is controlled to target.  She is advised to  continue Metoprolol 25 mg po daily and Midodrine 10 mg po three times daily.  3) Lipids/HPL:  Her most recent lipid panel from 10/05/19 shows controlled LDL of 81.  She is advised to continue Lipitor 40 mg po daily at bedtime.  Side effects and precautions discussed with her.  4) Chronic Care/Health Maintenance: -Continue to need one-on-one assistance at mealtimes as well as diligent hydration.  Patient needs to keep her appointment with Ophthalmology, Podiatrist at least yearly or according to recommendations, and advised to  stay away from smoking. I have recommended yearly flu vaccine and pneumonia vaccination at least every 5 years; and  sleep for at least 7 hours a day.  I advised patient to maintain close follow up with her PCP for primary care needs.  - Time spent on this patient care encounter:  35 min, of which > 50% was spent in  counseling and the rest reviewing her blood glucose logs , discussing her hypoglycemia and hyperglycemia episodes, reviewing her current and  previous labs / studies  ( including abstraction from other facilities) and medications  doses and developing a  long term treatment plan and documenting her care.   Please refer to Patient Instructions for Blood Glucose Monitoring and Insulin/Medications Dosing Guide"  in media tab for additional information. Please  also refer to " Patient Self Inventory" in the Media  tab for reviewed elements of pertinent patient history.  [name] participated in the discussions, expressed understanding, and voiced agreement with the above plans.  All questions were answered to her satisfaction. she is encouraged to contact clinic should she have any questions or  concerns prior to her return visit.   Follow up plan: Return in about 3 months (around 05/22/2020) for Diabetes follow up with A1c in office, Previsit labs, Bring glucometer and logs.  Rayetta Pigg, [facility] [address] [place], [place] [address] Phone: [phone] Fax: [phone]  02/23/2020, 9:50 AM

## 2020-02-24 DIAGNOSIS — U071 COVID-19: Secondary | ICD-10-CM | POA: Diagnosis not present

## 2020-02-24 DIAGNOSIS — Z20828 Contact with and (suspected) exposure to other viral communicable diseases: Secondary | ICD-10-CM | POA: Diagnosis not present

## 2020-02-25 DIAGNOSIS — F209 Schizophrenia, unspecified: Secondary | ICD-10-CM | POA: Diagnosis not present

## 2020-02-25 DIAGNOSIS — I4891 Unspecified atrial fibrillation: Secondary | ICD-10-CM | POA: Diagnosis not present

## 2020-02-25 DIAGNOSIS — E1151 Type 2 diabetes mellitus with diabetic peripheral angiopathy without gangrene: Secondary | ICD-10-CM | POA: Diagnosis not present

## 2020-02-25 DIAGNOSIS — I69354 Hemiplegia and hemiparesis following cerebral infarction affecting left non-dominant side: Secondary | ICD-10-CM | POA: Diagnosis not present

## 2020-02-25 DIAGNOSIS — R296 Repeated falls: Secondary | ICD-10-CM | POA: Diagnosis not present

## 2020-02-25 DIAGNOSIS — H6690 Otitis media, unspecified, unspecified ear: Secondary | ICD-10-CM | POA: Diagnosis not present

## 2020-02-26 DIAGNOSIS — M79671 Pain in right foot: Secondary | ICD-10-CM | POA: Diagnosis not present

## 2020-02-26 DIAGNOSIS — I69354 Hemiplegia and hemiparesis following cerebral infarction affecting left non-dominant side: Secondary | ICD-10-CM | POA: Diagnosis not present

## 2020-02-26 DIAGNOSIS — M79675 Pain in left toe(s): Secondary | ICD-10-CM | POA: Diagnosis not present

## 2020-02-26 DIAGNOSIS — F209 Schizophrenia, unspecified: Secondary | ICD-10-CM | POA: Diagnosis not present

## 2020-02-26 DIAGNOSIS — U071 COVID-19: Secondary | ICD-10-CM | POA: Diagnosis not present

## 2020-02-26 DIAGNOSIS — E1140 Type 2 diabetes mellitus with diabetic neuropathy, unspecified: Secondary | ICD-10-CM | POA: Diagnosis not present

## 2020-02-26 DIAGNOSIS — H6690 Otitis media, unspecified, unspecified ear: Secondary | ICD-10-CM | POA: Diagnosis not present

## 2020-02-26 DIAGNOSIS — M79672 Pain in left foot: Secondary | ICD-10-CM | POA: Diagnosis not present

## 2020-02-26 DIAGNOSIS — Z20828 Contact with and (suspected) exposure to other viral communicable diseases: Secondary | ICD-10-CM | POA: Diagnosis not present

## 2020-02-26 DIAGNOSIS — I739 Peripheral vascular disease, unspecified: Secondary | ICD-10-CM | POA: Diagnosis not present

## 2020-02-26 DIAGNOSIS — R296 Repeated falls: Secondary | ICD-10-CM | POA: Diagnosis not present

## 2020-02-26 DIAGNOSIS — I4891 Unspecified atrial fibrillation: Secondary | ICD-10-CM | POA: Diagnosis not present

## 2020-02-26 DIAGNOSIS — M79674 Pain in right toe(s): Secondary | ICD-10-CM | POA: Diagnosis not present

## 2020-02-26 DIAGNOSIS — E1151 Type 2 diabetes mellitus with diabetic peripheral angiopathy without gangrene: Secondary | ICD-10-CM | POA: Diagnosis not present

## 2020-02-27 DIAGNOSIS — E1142 Type 2 diabetes mellitus with diabetic polyneuropathy: Secondary | ICD-10-CM | POA: Diagnosis not present

## 2020-02-27 DIAGNOSIS — K219 Gastro-esophageal reflux disease without esophagitis: Secondary | ICD-10-CM | POA: Diagnosis not present

## 2020-02-27 DIAGNOSIS — R141 Gas pain: Secondary | ICD-10-CM | POA: Diagnosis not present

## 2020-02-27 DIAGNOSIS — I10 Essential (primary) hypertension: Secondary | ICD-10-CM | POA: Diagnosis not present

## 2020-03-01 DIAGNOSIS — E1151 Type 2 diabetes mellitus with diabetic peripheral angiopathy without gangrene: Secondary | ICD-10-CM | POA: Diagnosis not present

## 2020-03-01 DIAGNOSIS — F209 Schizophrenia, unspecified: Secondary | ICD-10-CM | POA: Diagnosis not present

## 2020-03-01 DIAGNOSIS — H6690 Otitis media, unspecified, unspecified ear: Secondary | ICD-10-CM | POA: Diagnosis not present

## 2020-03-01 DIAGNOSIS — R296 Repeated falls: Secondary | ICD-10-CM | POA: Diagnosis not present

## 2020-03-01 DIAGNOSIS — I4891 Unspecified atrial fibrillation: Secondary | ICD-10-CM | POA: Diagnosis not present

## 2020-03-01 DIAGNOSIS — I69354 Hemiplegia and hemiparesis following cerebral infarction affecting left non-dominant side: Secondary | ICD-10-CM | POA: Diagnosis not present

## 2020-03-02 DIAGNOSIS — Z79899 Other long term (current) drug therapy: Secondary | ICD-10-CM | POA: Diagnosis not present

## 2020-03-02 DIAGNOSIS — Z20828 Contact with and (suspected) exposure to other viral communicable diseases: Secondary | ICD-10-CM | POA: Diagnosis not present

## 2020-03-02 DIAGNOSIS — I10 Essential (primary) hypertension: Secondary | ICD-10-CM | POA: Diagnosis not present

## 2020-03-02 DIAGNOSIS — U071 COVID-19: Secondary | ICD-10-CM | POA: Diagnosis not present

## 2020-03-02 DIAGNOSIS — E1165 Type 2 diabetes mellitus with hyperglycemia: Secondary | ICD-10-CM | POA: Diagnosis not present

## 2020-03-02 DIAGNOSIS — Z0001 Encounter for general adult medical examination with abnormal findings: Secondary | ICD-10-CM | POA: Diagnosis not present

## 2020-03-04 DIAGNOSIS — F209 Schizophrenia, unspecified: Secondary | ICD-10-CM | POA: Diagnosis not present

## 2020-03-04 DIAGNOSIS — E1151 Type 2 diabetes mellitus with diabetic peripheral angiopathy without gangrene: Secondary | ICD-10-CM | POA: Diagnosis not present

## 2020-03-04 DIAGNOSIS — H6690 Otitis media, unspecified, unspecified ear: Secondary | ICD-10-CM | POA: Diagnosis not present

## 2020-03-04 DIAGNOSIS — R296 Repeated falls: Secondary | ICD-10-CM | POA: Diagnosis not present

## 2020-03-04 DIAGNOSIS — U071 COVID-19: Secondary | ICD-10-CM | POA: Diagnosis not present

## 2020-03-04 DIAGNOSIS — I4891 Unspecified atrial fibrillation: Secondary | ICD-10-CM | POA: Diagnosis not present

## 2020-03-04 DIAGNOSIS — I69354 Hemiplegia and hemiparesis following cerebral infarction affecting left non-dominant side: Secondary | ICD-10-CM | POA: Diagnosis not present

## 2020-03-04 DIAGNOSIS — Z20828 Contact with and (suspected) exposure to other viral communicable diseases: Secondary | ICD-10-CM | POA: Diagnosis not present

## 2020-03-08 DIAGNOSIS — F209 Schizophrenia, unspecified: Secondary | ICD-10-CM | POA: Diagnosis not present

## 2020-03-08 DIAGNOSIS — I4891 Unspecified atrial fibrillation: Secondary | ICD-10-CM | POA: Diagnosis not present

## 2020-03-08 DIAGNOSIS — H6690 Otitis media, unspecified, unspecified ear: Secondary | ICD-10-CM | POA: Diagnosis not present

## 2020-03-08 DIAGNOSIS — R296 Repeated falls: Secondary | ICD-10-CM | POA: Diagnosis not present

## 2020-03-08 DIAGNOSIS — I69354 Hemiplegia and hemiparesis following cerebral infarction affecting left non-dominant side: Secondary | ICD-10-CM | POA: Diagnosis not present

## 2020-03-08 DIAGNOSIS — E1151 Type 2 diabetes mellitus with diabetic peripheral angiopathy without gangrene: Secondary | ICD-10-CM | POA: Diagnosis not present

## 2020-03-09 DIAGNOSIS — U071 COVID-19: Secondary | ICD-10-CM | POA: Diagnosis not present

## 2020-03-09 DIAGNOSIS — H6690 Otitis media, unspecified, unspecified ear: Secondary | ICD-10-CM | POA: Diagnosis not present

## 2020-03-09 DIAGNOSIS — I4891 Unspecified atrial fibrillation: Secondary | ICD-10-CM | POA: Diagnosis not present

## 2020-03-09 DIAGNOSIS — E1151 Type 2 diabetes mellitus with diabetic peripheral angiopathy without gangrene: Secondary | ICD-10-CM | POA: Diagnosis not present

## 2020-03-09 DIAGNOSIS — F209 Schizophrenia, unspecified: Secondary | ICD-10-CM | POA: Diagnosis not present

## 2020-03-09 DIAGNOSIS — I69354 Hemiplegia and hemiparesis following cerebral infarction affecting left non-dominant side: Secondary | ICD-10-CM | POA: Diagnosis not present

## 2020-03-09 DIAGNOSIS — R296 Repeated falls: Secondary | ICD-10-CM | POA: Diagnosis not present

## 2020-03-09 DIAGNOSIS — Z20828 Contact with and (suspected) exposure to other viral communicable diseases: Secondary | ICD-10-CM | POA: Diagnosis not present

## 2020-03-10 DIAGNOSIS — E1151 Type 2 diabetes mellitus with diabetic peripheral angiopathy without gangrene: Secondary | ICD-10-CM | POA: Diagnosis not present

## 2020-03-10 DIAGNOSIS — H6690 Otitis media, unspecified, unspecified ear: Secondary | ICD-10-CM | POA: Diagnosis not present

## 2020-03-10 DIAGNOSIS — I4891 Unspecified atrial fibrillation: Secondary | ICD-10-CM | POA: Diagnosis not present

## 2020-03-10 DIAGNOSIS — I69354 Hemiplegia and hemiparesis following cerebral infarction affecting left non-dominant side: Secondary | ICD-10-CM | POA: Diagnosis not present

## 2020-03-10 DIAGNOSIS — F209 Schizophrenia, unspecified: Secondary | ICD-10-CM | POA: Diagnosis not present

## 2020-03-10 DIAGNOSIS — R296 Repeated falls: Secondary | ICD-10-CM | POA: Diagnosis not present

## 2020-03-11 DIAGNOSIS — U071 COVID-19: Secondary | ICD-10-CM | POA: Diagnosis not present

## 2020-03-11 DIAGNOSIS — Z20828 Contact with and (suspected) exposure to other viral communicable diseases: Secondary | ICD-10-CM | POA: Diagnosis not present

## 2020-03-16 DIAGNOSIS — Z20828 Contact with and (suspected) exposure to other viral communicable diseases: Secondary | ICD-10-CM | POA: Diagnosis not present

## 2020-03-16 DIAGNOSIS — U071 COVID-19: Secondary | ICD-10-CM | POA: Diagnosis not present

## 2020-03-18 DIAGNOSIS — I69354 Hemiplegia and hemiparesis following cerebral infarction affecting left non-dominant side: Secondary | ICD-10-CM | POA: Diagnosis not present

## 2020-03-18 DIAGNOSIS — Z20828 Contact with and (suspected) exposure to other viral communicable diseases: Secondary | ICD-10-CM | POA: Diagnosis not present

## 2020-03-18 DIAGNOSIS — U071 COVID-19: Secondary | ICD-10-CM | POA: Diagnosis not present

## 2020-03-18 DIAGNOSIS — F209 Schizophrenia, unspecified: Secondary | ICD-10-CM | POA: Diagnosis not present

## 2020-03-18 DIAGNOSIS — E1151 Type 2 diabetes mellitus with diabetic peripheral angiopathy without gangrene: Secondary | ICD-10-CM | POA: Diagnosis not present

## 2020-03-18 DIAGNOSIS — R296 Repeated falls: Secondary | ICD-10-CM | POA: Diagnosis not present

## 2020-03-18 DIAGNOSIS — I4891 Unspecified atrial fibrillation: Secondary | ICD-10-CM | POA: Diagnosis not present

## 2020-03-18 DIAGNOSIS — H6690 Otitis media, unspecified, unspecified ear: Secondary | ICD-10-CM | POA: Diagnosis not present

## 2020-03-22 DIAGNOSIS — Z7901 Long term (current) use of anticoagulants: Secondary | ICD-10-CM | POA: Diagnosis not present

## 2020-03-22 DIAGNOSIS — I4891 Unspecified atrial fibrillation: Secondary | ICD-10-CM | POA: Diagnosis not present

## 2020-03-22 DIAGNOSIS — M21372 Foot drop, left foot: Secondary | ICD-10-CM | POA: Diagnosis not present

## 2020-03-22 DIAGNOSIS — I69354 Hemiplegia and hemiparesis following cerebral infarction affecting left non-dominant side: Secondary | ICD-10-CM | POA: Diagnosis not present

## 2020-03-22 DIAGNOSIS — Z7984 Long term (current) use of oral hypoglycemic drugs: Secondary | ICD-10-CM | POA: Diagnosis not present

## 2020-03-22 DIAGNOSIS — E1151 Type 2 diabetes mellitus with diabetic peripheral angiopathy without gangrene: Secondary | ICD-10-CM | POA: Diagnosis not present

## 2020-03-22 DIAGNOSIS — R296 Repeated falls: Secondary | ICD-10-CM | POA: Diagnosis not present

## 2020-03-22 DIAGNOSIS — F209 Schizophrenia, unspecified: Secondary | ICD-10-CM | POA: Diagnosis not present

## 2020-03-22 DIAGNOSIS — Z794 Long term (current) use of insulin: Secondary | ICD-10-CM | POA: Diagnosis not present

## 2020-03-22 DIAGNOSIS — H6690 Otitis media, unspecified, unspecified ear: Secondary | ICD-10-CM | POA: Diagnosis not present

## 2020-03-23 DIAGNOSIS — G894 Chronic pain syndrome: Secondary | ICD-10-CM | POA: Diagnosis not present

## 2020-03-23 DIAGNOSIS — U071 COVID-19: Secondary | ICD-10-CM | POA: Diagnosis not present

## 2020-03-23 DIAGNOSIS — Z20828 Contact with and (suspected) exposure to other viral communicable diseases: Secondary | ICD-10-CM | POA: Diagnosis not present

## 2020-03-23 DIAGNOSIS — F419 Anxiety disorder, unspecified: Secondary | ICD-10-CM | POA: Diagnosis not present

## 2020-03-23 DIAGNOSIS — Z79891 Long term (current) use of opiate analgesic: Secondary | ICD-10-CM | POA: Diagnosis not present

## 2020-03-23 NOTE — Progress Notes (Signed)
Virtual Visit via Telephone Note  I connected with [name] on 04/06/20 at  8:00 AM EDT by telephone and verified that I am speaking with the correct person using two identifiers.  Location: Patient: group home Provider: office Persons participated in the visit- patient, provider   I discussed the limitations, risks, security and privacy concerns of performing an evaluation and management service by telephone and the availability of in person appointments. I also discussed with the patient that there may be a patient responsible charge related to this service. The patient expressed understanding and agreed to proceed.    I discussed the assessment and treatment plan with the patient. The patient was provided an opportunity to ask questions and all were answered. The patient agreed with the plan and demonstrated an understanding of the instructions.   The patient was advised to call back or seek an in-person evaluation if the symptoms worsen or if the condition fails to improve as anticipated.  I provided 22 minutes of non-face-to-face time during this encounter.   [name], MD    [facility] MD/PA/NP OP Progress Note  04/06/2020 8:36 AM [name]  MRN:  [id number]  Chief Complaint:  Chief Complaint    Schizophrenia; Depression     HPI:  - She was admitted for chest wall pain, ruled out cardiac cause. She will be followed by GI.   This is a follow-up appointment for schizophrenia.  She states that she has not been doing good, stating that she has "nerves ."  She feels this way all day long.  She enjoys watching TV or playing bingo.  She states that she has some Good friend in the group home.  Although she asks some medication for her nerves, she was redirected after being informed that she is already on medication to cover for her anxiety.  She has depressive symptoms as in PHQ-9.  She denies SI.  She denies paranoia.  She denies hallucinations.    Melissa, the staff at  [facility] presents to the interview.  There has been no change since the last visit.  She has not had any significant episode of agitation.  She has decreased appetite, and has some lost some weight.  She has an upcoming appointment with her PCP in May.  She takes medication regularly.  She tends to stay in the bed most of the time.  Melissa denies any other concern. Melissa agrees to bring her to in person visit at the next appointment.    Employment: unemployed. used to work for [employer], on disability around ten years ago after her back surgery Marital status: Widow, her husband deceased after 24 years of marriage in 2010 Number of children: 80, 24 year old daughter. She has a granddaughter  122 lbs Wt Readings from Last 3 Encounters:  11/26/19 127 lb 3.2 oz (57.7 kg)  11/11/19 128 lb 3.2 oz (58.2 kg)  11/06/19 125 lb (56.7 kg)    Visit Diagnosis:    ICD-10-CM   1. Schizophrenia, unspecified type ([place])  F20.9 ABILIFY 2 MG tablet    risperiDONE (RISPERDAL) 1 MG tablet  2. Mild episode of recurrent major depressive disorder (HCC)  F33.0     Past Psychiatric History: Please see initial evaluation for full details. I have reviewed the history. No updates at this time.     Past Medical History:  Past Medical History:  Diagnosis Date  . Anxiety   . Arthritis   . COVID-19 virus infection  COVID-19+ approx 01/24/19; asymptomatic course with full recovery  . Dependence on wheelchair    pivot/transfers  . Depression    History of psychosis and previous suicide attempt  . DVT, lower extremity, recurrent (HCC)    Long-term Coumadin per Dr. Legrand Rams  . Essential hypertension   . GERD (gastroesophageal reflux disease)   . Hemiplegia ([place]) 2010   Left side  . History of stroke    Acute infarct and right cerebral white matter small vessel disease 12/10  . Leg DVT (deep venous thromboembolism), acute ([place]) 2006  . Schizophrenia ([place])   . Stroke [facility])    left sided weakness  . Type 2  diabetes mellitus ([place])     Past Surgical History:  Procedure Laterality Date  . BACK SURGERY    . BIOPSY N/A 11/24/2014   Procedure: BIOPSY;  Surgeon: Danie Binder, MD;  Location: [facility] ORS;  Service: Endoscopy;  Laterality: N/A;  . BIOPSY  05/17/2016   Procedure: BIOPSY;  Surgeon: Danie Binder, MD;  Location: [facility] ENDO SUITE;  Service: Endoscopy;;  gastric biopsy  . COLONOSCOPY WITH PROPOFOL N/A 11/24/2014   Dr. Rudie Meyer polyps removed/moderate sized internal hemorrhoids, tubular adenomas. Next surveillance in 3 years  . ESOPHAGOGASTRODUODENOSCOPY (EGD) WITH PROPOFOL N/A 11/24/2014   Dr. Clayburn Pert HH/patent stricture at the gastroesophageal junction, mild non-erosive gastritis, path negative for H.pylori or celiac sprue  . ESOPHAGOGASTRODUODENOSCOPY (EGD) WITH PROPOFOL N/A 05/17/2016   Procedure: ESOPHAGOGASTRODUODENOSCOPY (EGD) WITH PROPOFOL;  Surgeon: Danie Binder, MD;  Location: [facility] END[facility] SU[facility];  Service: Endoscopy;  Laterality: N/A;  12:45pm  . GIVENS CAPSULE STUDY N/A 12/11/2014   MULTILPLE EROSION IN the stomach WITH ACTIVE OOZING. OCCASIONAL EROSIONS AND RARE ULCER SEEN IN PROXIMAL SMALL BOWEL . No masses or AVMs SEEN. NO OLD BLOOD OR FRESH BLOOD SEEN.   . POLYPECTOMY N/A 11/24/2014   Procedure: POLYPECTOMY;  Surgeon: Danie Binder, MD;  Location: [facility] ORS;  Service: Endoscopy;  Laterality: N/A;  . SAVORY DILATION N/A 05/17/2016   Procedure: SAVORY DILATION;  Surgeon: Danie Binder, MD;  Location: [facility] ENDO SUITE;  Service: Endoscopy;  Laterality: N/A;    Family Psychiatric History: Please see initial evaluation for full details. I have reviewed the history. No updates at this time.     Family History:  Family History  Problem Relation Age of Onset  . Hypertension Mother   . Colon cancer Neg Hx     Social History:  Social History   Socioeconomic History  . Marital status: Widowed    Spouse name: Not on file  . Number of children: Not on file  . Years of education: Not on  file  . Highest education level: Not on file  Occupational History  . Not on file  Tobacco Use  . Smoking status: Former Smoker    Packs/day: 0.25    Years: 20.00    Pack years: 5.00    Types: Cigarettes    Quit date: 01/24/1995    Years since quitting: 25.2  . Smokeless tobacco: Never Used  Vaping Use  . Vaping Use: Never used  Substance and Sexual Activity  . Alcohol use: No    Alcohol/week: 0.0 standard drinks  . Drug use: No  . Sexual activity: Never    Birth control/protection: Post-menopausal  Other Topics Concern  . Not on file  Social History Narrative  . Not on file   Social Determinants of Health   Financial Resource Strain: Not on file  Food Insecurity: Not  on file  Transportation Needs: Not on file  Physical Activity: Not on file  Stress: Not on file  Social Connections: Not on file    Allergies:  Allergies  Allergen Reactions  . Sulfa Antibiotics Rash    Metabolic Disorder Labs: Lab Results  Component Value Date   HGBA1C 9.2 (A) 02/23/2020   MPG 217.34 02/21/2020   MPG 257.52 10/05/2019   No results found for: PROLACTIN Lab Results  Component Value Date   CHOL 151 10/05/2019   TRIG 149 10/05/2019   HDL 40 (L) 10/05/2019   CHOLHDL 3.8 10/05/2019   VLDL 30 10/05/2019   LDLCALC 81 10/05/2019   LDLCALC 55 10/07/2016   Lab Results  Component Value Date   TSH 0.578 10/05/2019   TSH 1.138 03/29/2017    Therapeutic Level Labs: No results found for: LITHIUM No results found for: VALPROATE No components found for:  CBMZ  Current Medications: Current Outpatient Medications  Medication Sig Dispense Refill  . [START ON 04/26/2020] ABILIFY 2 MG tablet Take 1 tablet (2 mg total) by mouth at bedtime. 30 tablet 4  . acetaminophen (TYLENOL) 325 MG tablet Take 650 mg by mouth 3 (three) times daily as needed for mild pain or moderate pain.     Marland Kitchen apixaban (ELIQUIS) 5 MG TABS tablet Take 1 tablet (5 mg total) by mouth 2 (two) times daily. (Patient not  taking: Reported on 02/23/2020) 60 tablet 0  . atorvastatin (LIPITOR) 40 MG tablet Take 1 tablet (40 mg total) by mouth at bedtime. (Patient not taking: Reported on 02/23/2020) 60 tablet 0  . calcium carbonate (TUMS - DOSED IN MG ELEMENTAL CALCIUM) 500 MG chewable tablet Chew 1 tablet by mouth 4 (four) times daily as needed for heartburn.    . Carboxymethylcellulose Sodium (ARTIFICIAL TEARS OP) Place 1 drop into both eyes 3 (three) times daily.    . Cholecalciferol (VITAMIN D3) 50 MCG (2000 UT) TABS Take 2,000 Units by mouth daily.    . clonazePAM (KLONOPIN) 0.5 MG tablet Take 1 tablet (0.5 mg total) by mouth 3 (three) times daily as needed for anxiety. 90 tablet 2  . diltiazem (CARDIZEM CD) 120 MG 24 hr capsule Take 1 capsule (120 mg total) by mouth daily. 90 capsule 1  . docusate sodium (COLACE) 100 MG capsule Take 200 mg by mouth daily.    . DULoxetine (CYMBALTA) 60 MG capsule Take 60 mg by mouth daily.    Regino Schultze TEST test strip USE TO CHECK BLOOD SUGAR TWICE DAILY. (Patient taking differently: 1 each by Other route 2 (two) times daily.) 200 strip 2  . feeding supplement, GLUCERNA SHAKE, (GLUCERNA SHAKE) LIQD Take 237 mLs by mouth 3 (three) times daily between meals.    Marland Kitchen FERREX 150 150 MG capsule Take 150 mg by mouth daily.    Marland Kitchen gabapentin (NEURONTIN) 300 MG capsule Take 1 capsule (300 mg total) by mouth 3 (three) times daily as needed.    Marland Kitchen HYDROcodone-acetaminophen (NORCO) 10-325 MG tablet Take 1 tablet by mouth 3 (three) times daily as needed. 30 tablet   . hydrocortisone (ANUSOL-HC) 2.5 % rectal cream Place 1 application rectally 2 (two) times daily as needed (for rectal bleeding).    . insulin detemir (LEVEMIR FLEXTOUCH) 100 UNIT/ML FlexPen Inject 38 Units into the skin at bedtime. 15 mL 3  . Lancets 28G MISC USE TO CHECK BLOOD SUGAR TWICE DAILY. 100 each 0  . latanoprost (XALATAN) 0.005 % ophthalmic solution Place 1 drop into both eyes at  bedtime.    Marland Kitchen loratadine (CLARITIN) 10 MG tablet  Take 10 mg by mouth daily.    Marland Kitchen lubiprostone (AMITIZA) 24 MCG capsule Take 1 capsule (24 mcg total) by mouth 2 (two) times daily with a meal. 60 capsule 5  . metoprolol succinate (TOPROL-XL) 25 MG 24 hr tablet Take 1 tablet (25 mg total) by mouth daily. 90 tablet 3  . midodrine (PROAMATINE) 10 MG tablet TAKE (1) TABLET BY MOUTH (3) TIMES DAILY. (Patient taking differently: Take 10 mg by mouth 3 (three) times daily.) 90 tablet 3  . mirabegron ER (MYRBETRIQ) 25 MG TB24 tablet Take 25 mg by mouth daily.    . mirtazapine (REMERON) 30 MG tablet Take 30 mg by mouth at bedtime.    . ondansetron (ZOFRAN) 4 MG tablet Take 1 tablet (4 mg total) by mouth 4 (four) times daily -  before meals and at bedtime. (Patient taking differently: Take 4 mg by mouth 2 (two) times daily as needed for nausea or vomiting.) 120 tablet 3  . pantoprazole (PROTONIX) 40 MG tablet Take 1 tablet (40 mg total) by mouth daily. 270 tablet 3  . polyethylene glycol (MIRALAX / GLYCOLAX) 17 g packet Take 17 g by mouth daily. 14 each 0  . [START ON 04/26/2020] risperiDONE (RISPERDAL) 1 MG tablet Take 1 tablet (1 mg total) by mouth in the morning and at bedtime. 60 tablet 4  . senna (SENOKOT) 8.6 MG TABS tablet Take 1 tablet (8.6 mg total) by mouth daily. (Patient not taking: Reported on 02/23/2020) 120 tablet 0  . simvastatin (ZOCOR) 20 MG tablet Take 20 mg by mouth daily.    Marland Kitchen tiZANidine (ZANAFLEX) 2 MG tablet Take 1 tablet (2 mg total) by mouth daily as needed for muscle spasms. 30 tablet   . TRADJENTA 5 MG TABS tablet TAKE (1) TABLET BY MOUTH ONCE DAILY. (Patient taking differently: Take 5 mg by mouth daily.) 30 tablet 0  . zolpidem (AMBIEN) 10 MG tablet Take 10 mg by mouth at bedtime as needed for sleep.     No current facility-administered medications for this visit.     Musculoskeletal: Strength & Muscle Tone: N/A Gait & Station: N/A Patient leans: N/A  Psychiatric Specialty Exam: Review of Systems  Psychiatric/Behavioral:  Positive for dysphoric mood. Negative for agitation, behavioral problems, confusion, decreased concentration, hallucinations, self-injury, sleep disturbance and suicidal ideas. The patient is nervous/anxious. The patient is not hyperactive.   All other systems reviewed and are negative.   There were no vitals taken for this visit.There is no height or weight on file to calculate BMI.  General Appearance: NA  Eye Contact:  NA  Speech:  Clear and Coherent  Volume:  Normal  Mood:  not good  Affect:  NA  Thought Process:  Coherent  Orientation:  Full (Time, Place, and Person)  Thought Content: Logical   Suicidal Thoughts:  No  Homicidal Thoughts:  No  Memory:  Immediate;   Good  Judgement:  Good  Insight:  Present  Psychomotor Activity:  Normal  Concentration:  Concentration: Good and Attention Span: Good  Recall:  Good  Fund of Knowledge: Good  Language: Good  Akathisia:  No  Handed:  Right  AIMS (if indicated): not done  Assets:  Communication Skills Desire for Improvement  ADL's:  Intact  Cognition: WNL  Sleep:  Good   Screenings: PHQ2-9   Flowsheet Row Video Visit from 04/06/2020 in [place] Office Visit from 11/28/2017 in [facility]  Office Visit from 09/25/2017 in [facility] Office Visit from 03/28/2017 in [facility] Office Visit from 09/13/2016 in [facility]  PHQ-2 Total Score 5 0 0 0 0  PHQ-9 Total Score 9 -- -- -- --    Flowsheet Row Video Visit from 04/06/2020 in [place] CATEGORY No Risk       Assessment and Plan:  [name] is a 75 y.o. year old female with a history of schizophrenia, depression,SVT, DVT, history of stroke on warfarin, type II diabetes, hypertension, GERD, who presents for follow up appointment for below.   1. Schizophrenia, unspecified type ([place]) 2. MDD (major depressive  disorder), recurrent, mild ([place]) Although she continues to report occasionally depressed mood and anxiety, she has been more stable since the last visit.  Will continue current medication regimen.  We will continue Abilify and Risperdal to target schizophrenia.  Discussed potential metabolic side effect and EPS.  Noted that although it is preferable to do monotherapy, both the caregiver and the patient has strong preference to stay on the current medication regimen.  She also did have worsening in withdrawal symptoms/aggression in the context of tapering off Risperdal in the past.    # Appetite loss/weight loss She has an upcoming appointment with her PCP in May.  The staff is advised to keep this appointment and also check labs for metabolic panel to rule out any side effect from antipsychotics.   Plan 1. ContinueAbilify2 mg daily 2.ContinueRisperidone 1 mgtwice a day 3.Next appointment: 5/23 at 11 AM for 30 mins, in person viit - she has PCP appointment in May - Fax AVS to [phone] - onclonazepam 0.5 mg three timesa day for anxiety (prescribed by Dr. Merlene Laughter) - on mirtazapine 30 mg at night, prescribed by Dr. Legrand Rams - onduloxetine 120 mg daily(prescribed byDr. Legrand Rams  - onZolpidem 10 mg at night-prescribed by Dr. Legrand Rams -on gabapentin 300 mg TID   The patient demonstrates the following risk factors for suicide: Chronic risk factors for suicide include:psychiatric disorder ofschizophrenia by history, depression. Acute risk factorsfor suicide include: unemployment. Protective factorsfor this patient include: positive social support and hope for the future. Considering these factors, the overall suicide risk at this point appears to below. Patientisappropriate for outpatient follow up.  [name], MD 04/06/2020, 8:36 AM

## 2020-03-24 DIAGNOSIS — F209 Schizophrenia, unspecified: Secondary | ICD-10-CM | POA: Diagnosis not present

## 2020-03-24 DIAGNOSIS — H6690 Otitis media, unspecified, unspecified ear: Secondary | ICD-10-CM | POA: Diagnosis not present

## 2020-03-24 DIAGNOSIS — R296 Repeated falls: Secondary | ICD-10-CM | POA: Diagnosis not present

## 2020-03-24 DIAGNOSIS — I69354 Hemiplegia and hemiparesis following cerebral infarction affecting left non-dominant side: Secondary | ICD-10-CM | POA: Diagnosis not present

## 2020-03-24 DIAGNOSIS — E1151 Type 2 diabetes mellitus with diabetic peripheral angiopathy without gangrene: Secondary | ICD-10-CM | POA: Diagnosis not present

## 2020-03-24 DIAGNOSIS — I4891 Unspecified atrial fibrillation: Secondary | ICD-10-CM | POA: Diagnosis not present

## 2020-03-25 DIAGNOSIS — I4891 Unspecified atrial fibrillation: Secondary | ICD-10-CM | POA: Diagnosis not present

## 2020-03-25 DIAGNOSIS — F209 Schizophrenia, unspecified: Secondary | ICD-10-CM | POA: Diagnosis not present

## 2020-03-25 DIAGNOSIS — R296 Repeated falls: Secondary | ICD-10-CM | POA: Diagnosis not present

## 2020-03-25 DIAGNOSIS — U071 COVID-19: Secondary | ICD-10-CM | POA: Diagnosis not present

## 2020-03-25 DIAGNOSIS — I69354 Hemiplegia and hemiparesis following cerebral infarction affecting left non-dominant side: Secondary | ICD-10-CM | POA: Diagnosis not present

## 2020-03-25 DIAGNOSIS — H6690 Otitis media, unspecified, unspecified ear: Secondary | ICD-10-CM | POA: Diagnosis not present

## 2020-03-25 DIAGNOSIS — Z20828 Contact with and (suspected) exposure to other viral communicable diseases: Secondary | ICD-10-CM | POA: Diagnosis not present

## 2020-03-25 DIAGNOSIS — E1151 Type 2 diabetes mellitus with diabetic peripheral angiopathy without gangrene: Secondary | ICD-10-CM | POA: Diagnosis not present

## 2020-03-26 DIAGNOSIS — K219 Gastro-esophageal reflux disease without esophagitis: Secondary | ICD-10-CM | POA: Diagnosis not present

## 2020-03-26 DIAGNOSIS — I10 Essential (primary) hypertension: Secondary | ICD-10-CM | POA: Diagnosis not present

## 2020-03-29 DIAGNOSIS — H6690 Otitis media, unspecified, unspecified ear: Secondary | ICD-10-CM | POA: Diagnosis not present

## 2020-03-29 DIAGNOSIS — F209 Schizophrenia, unspecified: Secondary | ICD-10-CM | POA: Diagnosis not present

## 2020-03-29 DIAGNOSIS — R296 Repeated falls: Secondary | ICD-10-CM | POA: Diagnosis not present

## 2020-03-29 DIAGNOSIS — E1151 Type 2 diabetes mellitus with diabetic peripheral angiopathy without gangrene: Secondary | ICD-10-CM | POA: Diagnosis not present

## 2020-03-29 DIAGNOSIS — I69354 Hemiplegia and hemiparesis following cerebral infarction affecting left non-dominant side: Secondary | ICD-10-CM | POA: Diagnosis not present

## 2020-03-29 DIAGNOSIS — I4891 Unspecified atrial fibrillation: Secondary | ICD-10-CM | POA: Diagnosis not present

## 2020-03-30 DIAGNOSIS — U071 COVID-19: Secondary | ICD-10-CM | POA: Diagnosis not present

## 2020-03-30 DIAGNOSIS — Z20828 Contact with and (suspected) exposure to other viral communicable diseases: Secondary | ICD-10-CM | POA: Diagnosis not present

## 2020-03-31 ENCOUNTER — Telehealth: Payer: Medicare Other | Admitting: Psychiatry

## 2020-04-01 DIAGNOSIS — R296 Repeated falls: Secondary | ICD-10-CM | POA: Diagnosis not present

## 2020-04-01 DIAGNOSIS — F209 Schizophrenia, unspecified: Secondary | ICD-10-CM | POA: Diagnosis not present

## 2020-04-01 DIAGNOSIS — I4891 Unspecified atrial fibrillation: Secondary | ICD-10-CM | POA: Diagnosis not present

## 2020-04-01 DIAGNOSIS — I69354 Hemiplegia and hemiparesis following cerebral infarction affecting left non-dominant side: Secondary | ICD-10-CM | POA: Diagnosis not present

## 2020-04-01 DIAGNOSIS — E1151 Type 2 diabetes mellitus with diabetic peripheral angiopathy without gangrene: Secondary | ICD-10-CM | POA: Diagnosis not present

## 2020-04-01 DIAGNOSIS — H6690 Otitis media, unspecified, unspecified ear: Secondary | ICD-10-CM | POA: Diagnosis not present

## 2020-04-05 DIAGNOSIS — H6690 Otitis media, unspecified, unspecified ear: Secondary | ICD-10-CM | POA: Diagnosis not present

## 2020-04-05 DIAGNOSIS — I69354 Hemiplegia and hemiparesis following cerebral infarction affecting left non-dominant side: Secondary | ICD-10-CM | POA: Diagnosis not present

## 2020-04-05 DIAGNOSIS — E1151 Type 2 diabetes mellitus with diabetic peripheral angiopathy without gangrene: Secondary | ICD-10-CM | POA: Diagnosis not present

## 2020-04-05 DIAGNOSIS — I4891 Unspecified atrial fibrillation: Secondary | ICD-10-CM | POA: Diagnosis not present

## 2020-04-05 DIAGNOSIS — F209 Schizophrenia, unspecified: Secondary | ICD-10-CM | POA: Diagnosis not present

## 2020-04-05 DIAGNOSIS — R296 Repeated falls: Secondary | ICD-10-CM | POA: Diagnosis not present

## 2020-04-06 ENCOUNTER — Encounter: Payer: Self-pay | Admitting: Psychiatry

## 2020-04-06 ENCOUNTER — Telehealth (INDEPENDENT_AMBULATORY_CARE_PROVIDER_SITE_OTHER): Payer: Medicare Other | Admitting: Psychiatry

## 2020-04-06 ENCOUNTER — Other Ambulatory Visit: Payer: Self-pay

## 2020-04-06 DIAGNOSIS — F330 Major depressive disorder, recurrent, mild: Secondary | ICD-10-CM

## 2020-04-06 DIAGNOSIS — U071 COVID-19: Secondary | ICD-10-CM | POA: Diagnosis not present

## 2020-04-06 DIAGNOSIS — F209 Schizophrenia, unspecified: Secondary | ICD-10-CM | POA: Diagnosis not present

## 2020-04-06 DIAGNOSIS — Z20828 Contact with and (suspected) exposure to other viral communicable diseases: Secondary | ICD-10-CM | POA: Diagnosis not present

## 2020-04-06 MED ORDER — ABILIFY 2 MG PO TABS: 2.0000 mg | ORAL_TABLET | Freq: Every day | ORAL | 4 refills | Status: DC

## 2020-04-06 MED ORDER — RISPERIDONE 1 MG PO TABS
1.0000 mg | ORAL_TABLET | Freq: Two times a day (BID) | ORAL | 4 refills | Status: DC
Start: 2020-04-26 — End: 2020-09-10

## 2020-04-06 NOTE — Patient Instructions (Signed)
1. ContinueAbilify2 mg daily 2.ContinueRisperidone 1 mgtwice a day 3.Next appointment: 5/23 at 11 AM, in person visit

## 2020-04-07 DIAGNOSIS — R296 Repeated falls: Secondary | ICD-10-CM | POA: Diagnosis not present

## 2020-04-07 DIAGNOSIS — I69354 Hemiplegia and hemiparesis following cerebral infarction affecting left non-dominant side: Secondary | ICD-10-CM | POA: Diagnosis not present

## 2020-04-07 DIAGNOSIS — I4891 Unspecified atrial fibrillation: Secondary | ICD-10-CM | POA: Diagnosis not present

## 2020-04-07 DIAGNOSIS — H6690 Otitis media, unspecified, unspecified ear: Secondary | ICD-10-CM | POA: Diagnosis not present

## 2020-04-07 DIAGNOSIS — E1151 Type 2 diabetes mellitus with diabetic peripheral angiopathy without gangrene: Secondary | ICD-10-CM | POA: Diagnosis not present

## 2020-04-07 DIAGNOSIS — F209 Schizophrenia, unspecified: Secondary | ICD-10-CM | POA: Diagnosis not present

## 2020-04-08 DIAGNOSIS — F209 Schizophrenia, unspecified: Secondary | ICD-10-CM | POA: Diagnosis not present

## 2020-04-08 DIAGNOSIS — H6690 Otitis media, unspecified, unspecified ear: Secondary | ICD-10-CM | POA: Diagnosis not present

## 2020-04-08 DIAGNOSIS — I69354 Hemiplegia and hemiparesis following cerebral infarction affecting left non-dominant side: Secondary | ICD-10-CM | POA: Diagnosis not present

## 2020-04-08 DIAGNOSIS — R296 Repeated falls: Secondary | ICD-10-CM | POA: Diagnosis not present

## 2020-04-08 DIAGNOSIS — I4891 Unspecified atrial fibrillation: Secondary | ICD-10-CM | POA: Diagnosis not present

## 2020-04-08 DIAGNOSIS — E1151 Type 2 diabetes mellitus with diabetic peripheral angiopathy without gangrene: Secondary | ICD-10-CM | POA: Diagnosis not present

## 2020-04-13 DIAGNOSIS — I4891 Unspecified atrial fibrillation: Secondary | ICD-10-CM | POA: Diagnosis not present

## 2020-04-13 DIAGNOSIS — R296 Repeated falls: Secondary | ICD-10-CM | POA: Diagnosis not present

## 2020-04-13 DIAGNOSIS — E1151 Type 2 diabetes mellitus with diabetic peripheral angiopathy without gangrene: Secondary | ICD-10-CM | POA: Diagnosis not present

## 2020-04-13 DIAGNOSIS — I69354 Hemiplegia and hemiparesis following cerebral infarction affecting left non-dominant side: Secondary | ICD-10-CM | POA: Diagnosis not present

## 2020-04-13 DIAGNOSIS — Z20828 Contact with and (suspected) exposure to other viral communicable diseases: Secondary | ICD-10-CM | POA: Diagnosis not present

## 2020-04-13 DIAGNOSIS — F209 Schizophrenia, unspecified: Secondary | ICD-10-CM | POA: Diagnosis not present

## 2020-04-13 DIAGNOSIS — U071 COVID-19: Secondary | ICD-10-CM | POA: Diagnosis not present

## 2020-04-13 DIAGNOSIS — H6690 Otitis media, unspecified, unspecified ear: Secondary | ICD-10-CM | POA: Diagnosis not present

## 2020-04-15 DIAGNOSIS — F209 Schizophrenia, unspecified: Secondary | ICD-10-CM | POA: Diagnosis not present

## 2020-04-15 DIAGNOSIS — E1151 Type 2 diabetes mellitus with diabetic peripheral angiopathy without gangrene: Secondary | ICD-10-CM | POA: Diagnosis not present

## 2020-04-15 DIAGNOSIS — I69354 Hemiplegia and hemiparesis following cerebral infarction affecting left non-dominant side: Secondary | ICD-10-CM | POA: Diagnosis not present

## 2020-04-15 DIAGNOSIS — R296 Repeated falls: Secondary | ICD-10-CM | POA: Diagnosis not present

## 2020-04-15 DIAGNOSIS — H6690 Otitis media, unspecified, unspecified ear: Secondary | ICD-10-CM | POA: Diagnosis not present

## 2020-04-15 DIAGNOSIS — I4891 Unspecified atrial fibrillation: Secondary | ICD-10-CM | POA: Diagnosis not present

## 2020-04-18 ENCOUNTER — Emergency Department (HOSPITAL_COMMUNITY): Payer: Medicare Other

## 2020-04-18 ENCOUNTER — Encounter (HOSPITAL_COMMUNITY): Payer: Self-pay | Admitting: Emergency Medicine

## 2020-04-18 ENCOUNTER — Other Ambulatory Visit: Payer: Self-pay

## 2020-04-18 ENCOUNTER — Inpatient Hospital Stay (HOSPITAL_COMMUNITY)
Admission: EM | Admit: 2020-04-18 | Discharge: 2020-04-26 | DRG: 389 | Disposition: A | Discharge status: Nursing Home (Includes ICF) | Payer: Medicare Other | Attending: Family Medicine | Admitting: Family Medicine

## 2020-04-18 DIAGNOSIS — K56600 Partial intestinal obstruction, unspecified as to cause: Secondary | ICD-10-CM | POA: Diagnosis present

## 2020-04-18 DIAGNOSIS — Z8673 Personal history of transient ischemic attack (TIA), and cerebral infarction without residual deficits: Secondary | ICD-10-CM

## 2020-04-18 DIAGNOSIS — K219 Gastro-esophageal reflux disease without esophagitis: Secondary | ICD-10-CM | POA: Diagnosis present

## 2020-04-18 DIAGNOSIS — K56609 Unspecified intestinal obstruction, unspecified as to partial versus complete obstruction: Secondary | ICD-10-CM | POA: Diagnosis present

## 2020-04-18 DIAGNOSIS — Z8249 Family history of ischemic heart disease and other diseases of the circulatory system: Secondary | ICD-10-CM

## 2020-04-18 DIAGNOSIS — F419 Anxiety disorder, unspecified: Secondary | ICD-10-CM | POA: Diagnosis present

## 2020-04-18 DIAGNOSIS — E785 Hyperlipidemia, unspecified: Secondary | ICD-10-CM | POA: Diagnosis present

## 2020-04-18 DIAGNOSIS — Z794 Long term (current) use of insulin: Secondary | ICD-10-CM

## 2020-04-18 DIAGNOSIS — Z7984 Long term (current) use of oral hypoglycemic drugs: Secondary | ICD-10-CM

## 2020-04-18 DIAGNOSIS — E119 Type 2 diabetes mellitus without complications: Secondary | ICD-10-CM | POA: Diagnosis present

## 2020-04-18 DIAGNOSIS — D509 Iron deficiency anemia, unspecified: Secondary | ICD-10-CM | POA: Diagnosis present

## 2020-04-18 DIAGNOSIS — Z87891 Personal history of nicotine dependence: Secondary | ICD-10-CM

## 2020-04-18 DIAGNOSIS — M549 Dorsalgia, unspecified: Secondary | ICD-10-CM | POA: Diagnosis present

## 2020-04-18 DIAGNOSIS — Z20822 Contact with and (suspected) exposure to covid-19: Secondary | ICD-10-CM | POA: Diagnosis present

## 2020-04-18 DIAGNOSIS — R0689 Other abnormalities of breathing: Secondary | ICD-10-CM | POA: Diagnosis not present

## 2020-04-18 DIAGNOSIS — Z79899 Other long term (current) drug therapy: Secondary | ICD-10-CM

## 2020-04-18 DIAGNOSIS — Z8616 Personal history of COVID-19: Secondary | ICD-10-CM

## 2020-04-18 DIAGNOSIS — Z86718 Personal history of other venous thrombosis and embolism: Secondary | ICD-10-CM

## 2020-04-18 DIAGNOSIS — I951 Orthostatic hypotension: Secondary | ICD-10-CM | POA: Diagnosis present

## 2020-04-18 DIAGNOSIS — K589 Irritable bowel syndrome without diarrhea: Secondary | ICD-10-CM | POA: Diagnosis present

## 2020-04-18 DIAGNOSIS — I480 Paroxysmal atrial fibrillation: Secondary | ICD-10-CM | POA: Diagnosis present

## 2020-04-18 DIAGNOSIS — G8194 Hemiplegia, unspecified affecting left nondominant side: Secondary | ICD-10-CM | POA: Diagnosis present

## 2020-04-18 DIAGNOSIS — F32A Depression, unspecified: Secondary | ICD-10-CM | POA: Diagnosis present

## 2020-04-18 DIAGNOSIS — E876 Hypokalemia: Secondary | ICD-10-CM | POA: Diagnosis not present

## 2020-04-18 DIAGNOSIS — I4891 Unspecified atrial fibrillation: Secondary | ICD-10-CM | POA: Diagnosis not present

## 2020-04-18 DIAGNOSIS — R109 Unspecified abdominal pain: Secondary | ICD-10-CM | POA: Diagnosis not present

## 2020-04-18 DIAGNOSIS — G8929 Other chronic pain: Secondary | ICD-10-CM | POA: Diagnosis present

## 2020-04-18 DIAGNOSIS — I4892 Unspecified atrial flutter: Secondary | ICD-10-CM | POA: Diagnosis present

## 2020-04-18 DIAGNOSIS — I4819 Other persistent atrial fibrillation: Secondary | ICD-10-CM | POA: Diagnosis not present

## 2020-04-18 DIAGNOSIS — I10 Essential (primary) hypertension: Secondary | ICD-10-CM | POA: Diagnosis present

## 2020-04-18 DIAGNOSIS — F209 Schizophrenia, unspecified: Secondary | ICD-10-CM | POA: Diagnosis present

## 2020-04-18 DIAGNOSIS — E0590 Thyrotoxicosis, unspecified without thyrotoxic crisis or storm: Secondary | ICD-10-CM

## 2020-04-18 DIAGNOSIS — R1110 Vomiting, unspecified: Secondary | ICD-10-CM | POA: Diagnosis not present

## 2020-04-18 DIAGNOSIS — Z7901 Long term (current) use of anticoagulants: Secondary | ICD-10-CM | POA: Diagnosis not present

## 2020-04-18 DIAGNOSIS — Z993 Dependence on wheelchair: Secondary | ICD-10-CM

## 2020-04-18 DIAGNOSIS — Z882 Allergy status to sulfonamides status: Secondary | ICD-10-CM

## 2020-04-18 DIAGNOSIS — R1111 Vomiting without nausea: Secondary | ICD-10-CM | POA: Diagnosis not present

## 2020-04-18 DIAGNOSIS — K5900 Constipation, unspecified: Secondary | ICD-10-CM | POA: Diagnosis not present

## 2020-04-18 LAB — URINALYSIS, ROUTINE W REFLEX MICROSCOPIC
Bilirubin Urine: NEGATIVE
Glucose, UA: NEGATIVE mg/dL
Hgb urine dipstick: NEGATIVE
Ketones, ur: 80 mg/dL — AB
Leukocytes,Ua: NEGATIVE
Nitrite: NEGATIVE
Protein, ur: NEGATIVE mg/dL
Specific Gravity, Urine: >1.046 — ABNORMAL HIGH (ref 1.005–1.030)
pH: 5 (ref 5.0–8.0)

## 2020-04-18 LAB — RESP PANEL BY RT-PCR (FLU A&B, COVID) ARPGX2
Influenza A by PCR: NEGATIVE
Influenza B by PCR: NEGATIVE
SARS Coronavirus 2 by RT PCR: NEGATIVE

## 2020-04-18 LAB — CBC WITH DIFFERENTIAL/PLATELET
Abs Immature Granulocytes: 0.02 10*3/uL (ref 0.00–0.07)
Basophils Absolute: 0 10*3/uL (ref 0.0–0.1)
Basophils Relative: 0 %
Eosinophils Absolute: 0 10*3/uL (ref 0.0–0.5)
Eosinophils Relative: 0 %
HCT: 39.6 % (ref 36.0–46.0)
Hemoglobin: 13.8 g/dL (ref 12.0–15.0)
Immature Granulocytes: 0 %
Lymphocytes Relative: 11 %
Lymphs Abs: 1.1 10*3/uL (ref 0.7–4.0)
MCH: 30.2 pg (ref 26.0–34.0)
MCHC: 34.8 g/dL (ref 30.0–36.0)
MCV: 86.7 fL (ref 80.0–100.0)
Monocytes Absolute: 0.6 10*3/uL (ref 0.1–1.0)
Monocytes Relative: 6 %
Neutro Abs: 7.9 10*3/uL — ABNORMAL HIGH (ref 1.7–7.7)
Neutrophils Relative %: 83 %
Platelets: 250 10*3/uL (ref 150–400)
RBC: 4.57 MIL/uL (ref 3.87–5.11)
RDW: 12.5 % (ref 11.5–15.5)
WBC: 9.5 10*3/uL (ref 4.0–10.5)
nRBC: 0 % (ref 0.0–0.2)

## 2020-04-18 LAB — COMPREHENSIVE METABOLIC PANEL
ALT: 13 U/L (ref 0–44)
AST: 18 U/L (ref 15–41)
Albumin: 3.4 g/dL — ABNORMAL LOW (ref 3.5–5.0)
Alkaline Phosphatase: 53 U/L (ref 38–126)
Anion gap: 12 (ref 5–15)
BUN: 12 mg/dL (ref 8–23)
CO2: 29 mmol/L (ref 22–32)
Calcium: 8.5 mg/dL — ABNORMAL LOW (ref 8.9–10.3)
Chloride: 93 mmol/L — ABNORMAL LOW (ref 98–111)
Creatinine, Ser: 0.61 mg/dL (ref 0.44–1.00)
GFR, Estimated: >60 mL/min (ref >60)
Glucose, Bld: 97 mg/dL (ref 70–99)
Potassium: 3 mmol/L — ABNORMAL LOW (ref 3.5–5.1)
Sodium: 134 mmol/L — ABNORMAL LOW (ref 135–145)
Total Bilirubin: 0.7 mg/dL (ref 0.3–1.2)
Total Protein: 6.7 g/dL (ref 6.5–8.1)

## 2020-04-18 LAB — LIPASE, BLOOD: Lipase: 19 U/L (ref 11–51)

## 2020-04-18 LAB — APTT
aPTT: 33 seconds (ref 24–36)
aPTT: 80 seconds — ABNORMAL HIGH (ref 24–36)

## 2020-04-18 LAB — HEPARIN LEVEL (UNFRACTIONATED): Heparin Unfractionated: >2.2 IU/mL — ABNORMAL HIGH (ref 0.30–0.70)

## 2020-04-18 LAB — GLUCOSE, CAPILLARY
Glucose-Capillary: 79 mg/dL (ref 70–99)
Glucose-Capillary: 97 mg/dL (ref 70–99)
Glucose-Capillary: 98 mg/dL (ref 70–99)

## 2020-04-18 LAB — MRSA PCR SCREENING: MRSA by PCR: NEGATIVE

## 2020-04-18 LAB — TSH: TSH: 0.293 u[IU]/mL — ABNORMAL LOW (ref 0.350–4.500)

## 2020-04-18 MED ORDER — IOHEXOL 300 MG/ML  SOLN
100.0000 mL | Freq: Once | INTRAMUSCULAR | Status: AC | PRN
  Administered 2020-04-18: 100 mL via INTRAVENOUS

## 2020-04-18 MED ORDER — HYDROMORPHONE HCL 1 MG/ML IJ SOLN
0.5000 mg | INTRAMUSCULAR | Status: DC | PRN
  Administered 2020-04-18 – 2020-04-21 (×11): 1 mg via INTRAVENOUS
  Filled 2020-04-18 (×12): qty 1

## 2020-04-18 MED ORDER — HYDROMORPHONE HCL 1 MG/ML IJ SOLN
0.5000 mg | Freq: Once | INTRAMUSCULAR | Status: AC
  Administered 2020-04-18: 0.5 mg via INTRAVENOUS
  Filled 2020-04-18: qty 1

## 2020-04-18 MED ORDER — INSULIN ASPART 100 UNIT/ML ~~LOC~~ SOLN
0.0000 [IU] | SUBCUTANEOUS | Status: DC
  Administered 2020-04-19 (×2): 1 [IU] via SUBCUTANEOUS
  Administered 2020-04-19 (×2): 2 [IU] via SUBCUTANEOUS
  Administered 2020-04-19: 1 [IU] via SUBCUTANEOUS
  Administered 2020-04-20 (×2): 2 [IU] via SUBCUTANEOUS
  Administered 2020-04-20: 3 [IU] via SUBCUTANEOUS
  Administered 2020-04-20 – 2020-04-21 (×5): 2 [IU] via SUBCUTANEOUS
  Administered 2020-04-21: 1 [IU] via SUBCUTANEOUS
  Administered 2020-04-21 – 2020-04-22 (×3): 2 [IU] via SUBCUTANEOUS
  Administered 2020-04-22: 1 [IU] via SUBCUTANEOUS
  Administered 2020-04-22: 2 [IU] via SUBCUTANEOUS
  Administered 2020-04-22: 1 [IU] via SUBCUTANEOUS
  Administered 2020-04-23: 2 [IU] via SUBCUTANEOUS
  Administered 2020-04-23: 1 [IU] via SUBCUTANEOUS

## 2020-04-18 MED ORDER — ONDANSETRON HCL 4 MG PO TABS
4.0000 mg | ORAL_TABLET | Freq: Four times a day (QID) | ORAL | Status: DC | PRN
  Administered 2020-04-21 – 2020-04-26 (×2): 4 mg via ORAL
  Filled 2020-04-18 (×3): qty 1

## 2020-04-18 MED ORDER — HALOPERIDOL LACTATE 5 MG/ML IJ SOLN
2.0000 mg | Freq: Four times a day (QID) | INTRAMUSCULAR | Status: DC | PRN
  Administered 2020-04-19 – 2020-04-22 (×5): 2 mg via INTRAVENOUS
  Filled 2020-04-18 (×5): qty 1

## 2020-04-18 MED ORDER — HEPARIN (PORCINE) 25000 UT/250ML-% IV SOLN
1000.0000 [IU]/h | INTRAVENOUS | Status: DC
  Administered 2020-04-18: 850 [IU]/h via INTRAVENOUS
  Administered 2020-04-19 – 2020-04-20 (×2): 950 [IU]/h via INTRAVENOUS
  Filled 2020-04-18 (×3): qty 250

## 2020-04-18 MED ORDER — ONDANSETRON HCL 4 MG/2ML IJ SOLN
4.0000 mg | Freq: Four times a day (QID) | INTRAMUSCULAR | Status: DC | PRN
  Administered 2020-04-18 – 2020-04-25 (×12): 4 mg via INTRAVENOUS
  Filled 2020-04-18 (×12): qty 2

## 2020-04-18 MED ORDER — PANTOPRAZOLE SODIUM 40 MG IV SOLR
40.0000 mg | INTRAVENOUS | Status: DC
  Administered 2020-04-18 – 2020-04-21 (×4): 40 mg via INTRAVENOUS
  Filled 2020-04-18 (×4): qty 40

## 2020-04-18 MED ORDER — ONDANSETRON HCL 4 MG/2ML IJ SOLN
4.0000 mg | Freq: Once | INTRAMUSCULAR | Status: AC
  Administered 2020-04-18: 4 mg via INTRAVENOUS
  Filled 2020-04-18: qty 2

## 2020-04-18 MED ORDER — ACETAMINOPHEN 650 MG RE SUPP: 650.0000 mg | Freq: Four times a day (QID) | RECTAL | Status: DC | PRN

## 2020-04-18 MED ORDER — SODIUM CHLORIDE 0.9 % IV BOLUS
500.0000 mL | Freq: Once | INTRAVENOUS | Status: AC
  Administered 2020-04-18: 500 mL via INTRAVENOUS

## 2020-04-18 MED ORDER — CHLORHEXIDINE GLUCONATE CLOTH 2 % EX PADS
6.0000 | MEDICATED_PAD | Freq: Every day | CUTANEOUS | Status: DC
  Administered 2020-04-18 – 2020-04-24 (×7): 6 via TOPICAL

## 2020-04-18 MED ORDER — LATANOPROST 0.005 % OP SOLN
1.0000 [drp] | Freq: Every day | OPHTHALMIC | Status: DC
  Administered 2020-04-18 – 2020-04-25 (×8): 1 [drp] via OPHTHALMIC
  Filled 2020-04-18 (×3): qty 2.5

## 2020-04-18 MED ORDER — POTASSIUM CHLORIDE 10 MEQ/100ML IV SOLN
10.0000 meq | Freq: Once | INTRAVENOUS | Status: AC
  Administered 2020-04-18: 10 meq via INTRAVENOUS
  Filled 2020-04-18: qty 100

## 2020-04-18 MED ORDER — POTASSIUM CHLORIDE 10 MEQ/100ML IV SOLN
10.0000 meq | INTRAVENOUS | Status: AC
Start: 2020-04-18 — End: 2020-04-19
  Administered 2020-04-18 (×4): 10 meq via INTRAVENOUS
  Filled 2020-04-18 (×4): qty 100

## 2020-04-18 MED ORDER — ACETAMINOPHEN 325 MG PO TABS
650.0000 mg | ORAL_TABLET | Freq: Four times a day (QID) | ORAL | Status: DC | PRN
  Administered 2020-04-21 – 2020-04-26 (×3): 650 mg via ORAL
  Filled 2020-04-18 (×2): qty 2

## 2020-04-18 MED ORDER — SODIUM CHLORIDE 0.9 % IV SOLN: INTRAVENOUS | Status: DC

## 2020-04-18 MED ORDER — DILTIAZEM HCL-DEXTROSE 125-5 MG/125ML-% IV SOLN (PREMIX)
5.0000 mg/h | INTRAVENOUS | Status: DC
  Administered 2020-04-18: 5 mg/h via INTRAVENOUS
  Filled 2020-04-18: qty 125

## 2020-04-18 NOTE — ED Notes (Signed)
Called and spoke with Dr. Arnoldo Morale

## 2020-04-18 NOTE — H&P (Addendum)
History and Physical    [name] TMH:[id number] DOB: 1945/06/10 DOA: 04/18/2020  PCP: [name], MD   Patient coming from: [facility]  Chief Complaint: N/V  HPI: [name] is a 75 y.o. female with medical history significant for schizophrenia, dyslipidemia, chronic pain, GERD, type 2 diabetes, atrial fibrillation on Eliquis, and chronic iron deficiency anemia who was brought to the ED from her skilled facility due to worsening abdominal pain and mild distention.  Patient apparently had nausea and vomiting as well over the last 4 days and it is uncertain whether or not she has been able to keep down her medications.  She appears to have some confusion as well.  She denies any diarrhea or bowel movements in the last few days.  No chest pain or shortness of breath are described.   ED Course: Patient noted to have significant tachyarrhythmia with atrial fibrillation/flutter noted on EKG.  Blood pressure stable and patient is afebrile.  Laboratory data with potassium of 3.0.  CT of the abdomen and pelvis demonstrating partial to developing SBO with fecal impaction.  ECG does not appear impacted on rectal examination per EDP and stool occult testing is negative.  She has been started on IV fluid as well as some potassium supplementation.  Cardizem drip has also been initiated for rate control.  Review of Systems: Difficult to fully obtain given patient condition.  Past Medical History:  Diagnosis Date  . Anxiety   . Arthritis   . COVID-19 virus infection    COVID-19+ approx 01/24/19; asymptomatic course with full recovery  . Dependence on wheelchair    pivot/transfers  . Depression    History of psychosis and previous suicide attempt  . DVT, lower extremity, recurrent (HCC)    Long-term Coumadin per Dr. Legrand Rams  . Essential hypertension   . GERD (gastroesophageal reflux disease)   . Hemiplegia ([place]) 2010   Left side  . History of stroke    Acute infarct and right cerebral  white matter small vessel disease 12/10  . Leg DVT (deep venous thromboembolism), acute ([place]) 2006  . Schizophrenia ([place])   . Stroke [facility])    left sided weakness  . Type 2 diabetes mellitus ([place])     Past Surgical History:  Procedure Laterality Date  . BACK SURGERY    . BIOPSY N/A 11/24/2014   Procedure: BIOPSY;  Surgeon: Danie Binder, MD;  Location: [facility];  Service: Endoscopy;  Laterality: N/A;  . BIOPSY  05/17/2016   Procedure: BIOPSY;  Surgeon: Danie Binder, MD;  Location: [facility];  Service: Endoscopy;;  gastric biopsy  . COLONOSCOPY WITH PROPOFOL N/A 11/24/2014   Dr. Rudie Meyer polyps removed/moderate sized internal hemorrhoids, tubular adenomas. Next surveillance in 3 years  . ESOPHAGOGASTRODUODENOSCOPY (EGD) WITH PROPOFOL N/A 11/24/2014   Dr. Clayburn Pert HH/patent stricture at the gastroesophageal junction, mild non-erosive gastritis, path negative for H.pylori or celiac sprue  . ESOPHAGOGASTRODUODENOSCOPY (EGD) WITH PROPOFOL N/A 05/17/2016   Procedure: ESOPHAGOGASTRODUODENOSCOPY (EGD) WITH PROPOFOL;  Surgeon: Danie Binder, MD;  Location: [facility];  Service: Endoscopy;  Laterality: N/A;  12:45pm  . GIVENS CAPSULE STUDY N/A 12/11/2014   MULTILPLE EROSION IN the stomach WITH ACTIVE OOZING. OCCASIONAL EROSIONS AND RARE ULCER SEEN IN PROXIMAL SMALL BOWEL . No masses or AVMs SEEN. NO OLD BLOOD OR FRESH BLOOD SEEN.   . POLYPECTOMY N/A 11/24/2014   Procedure: POLYPECTOMY;  Surgeon: Danie Binder, MD;  Location: [facility];  Service: Endoscopy;  Laterality:  N/A;  . SAVORY DILATION N/A 05/17/2016   Procedure: SAVORY DILATION;  Surgeon: Danie Binder, MD;  Location: [facility];  Service: Endoscopy;  Laterality: N/A;     reports that she quit smoking about 25 years ago. Her smoking use included cigarettes. She has a 5.00 pack-year smoking history. She has never used smokeless tobacco. She reports that she does not drink alcohol and does not use drugs.  Allergies  Allergen  Reactions  . Sulfa Antibiotics Rash    Family History  Problem Relation Age of Onset  . Hypertension Mother   . Colon cancer Neg Hx     Prior to Admission medications   Medication Sig Start Date End Date Taking? Authorizing Provider  ABILIFY 2 MG tablet Take 1 tablet (2 mg total) by mouth at bedtime. 04/26/20 09/23/20 Yes [name], MD  acetaminophen (TYLENOL) 325 MG tablet Take 650 mg by mouth 3 (three) times daily as needed for mild pain or moderate pain.    Yes [provider]  apixaban (ELIQUIS) 5 MG TABS tablet Take 1 tablet (5 mg total) by mouth 2 (two) times daily. 02/21/20  Yes Anderson, Chelsey L, DO  atorvastatin (LIPITOR) 40 MG tablet Take 1 tablet (40 mg total) by mouth at bedtime. 02/21/20  Yes Anderson, Chelsey L, DO  calcium carbonate (TUMS - DOSED IN MG ELEMENTAL CALCIUM) 500 MG chewable tablet Chew 1 tablet by mouth 4 (four) times daily as needed for heartburn.   Yes [provider]  Carboxymethylcellulose Sodium (ARTIFICIAL TEARS OP) Place 1 drop into both eyes 3 (three) times daily.   Yes [provider]  Cholecalciferol (VITAMIN D3) 50 MCG (2000 UT) TABS Take 2,000 Units by mouth daily.   Yes [provider]  clonazePAM (KLONOPIN) 0.5 MG tablet Take 1 tablet (0.5 mg total) by mouth 3 (three) times daily as needed for anxiety. 02/21/20  Yes Anderson, Chelsey L, DO  diltiazem (CARDIZEM CD) 120 MG 24 hr capsule Take 1 capsule (120 mg total) by mouth daily. 12/23/19  Yes Verta Ellen., NP  DULoxetine (CYMBALTA) 60 MG capsule Take 60 mg by mouth daily.   Yes [provider]  FERREX 150 150 MG capsule Take 150 mg by mouth daily.   Yes [provider]  gabapentin (NEURONTIN) 300 MG capsule Take 1 capsule (300 mg total) by mouth 3 (three) times daily as needed. 02/21/20  Yes Anderson, Chelsey L, DO  HYDROcodone-acetaminophen (NORCO) 10-325 MG tablet Take 1 tablet by mouth 3 (three) times daily as needed. 02/21/20  Yes  Anderson, Chelsey L, DO  hydrocortisone (ANUSOL-HC) 2.5 % rectal cream Place 1 application rectally 2 (two) times daily as needed (for rectal bleeding).   Yes [provider]  hydrOXYzine (ATARAX/VISTARIL) 25 MG tablet Take 25 mg by mouth 3 (three) times daily. 04/08/20  Yes [provider]  insulin detemir (LEVEMIR FLEXTOUCH) 100 UNIT/ML FlexPen Inject 38 Units into the skin at bedtime. Patient taking differently: Inject 42 Units into the skin at bedtime. 11/06/19  Yes Nida, Marella Chimes, MD  latanoprost (XALATAN) 0.005 % ophthalmic solution Place 1 drop into both eyes at bedtime.   Yes [provider]  loratadine (CLARITIN) 10 MG tablet Take 10 mg by mouth daily.   Yes [provider]  lubiprostone (AMITIZA) 24 MCG capsule Take 1 capsule (24 mcg total) by mouth 2 (two) times daily with a meal. 07/02/19  Yes Aliene Altes S, PA-C  metoprolol succinate (TOPROL-XL) 25 MG 24 hr  tablet Take 1 tablet (25 mg total) by mouth daily. 11/27/19  Yes Verta Ellen., NP  midodrine (PROAMATINE) 10 MG tablet TAKE (1) TABLET BY MOUTH (3) TIMES DAILY. Patient taking differently: Take 10 mg by mouth 3 (three) times daily. 02/09/20  Yes Verta Ellen., NP  mirabegron ER (MYRBETRIQ) 25 MG TB24 tablet Take 25 mg by mouth daily.   Yes [provider]  mirtazapine (REMERON) 30 MG tablet Take 30 mg by mouth at bedtime.   Yes [provider]  ondansetron (ZOFRAN) 4 MG tablet Take 1 tablet (4 mg total) by mouth 4 (four) times daily -  before meals and at bedtime. Patient taking differently: Take 4 mg by mouth 2 (two) times daily as needed for nausea or vomiting. 04/20/16  Yes Annitta Needs, NP  pantoprazole (PROTONIX) 40 MG tablet Take 1 tablet (40 mg total) by mouth daily. 02/21/20  Yes Anderson, Chelsey L, DO  polyethylene glycol (MIRALAX / GLYCOLAX) 17 g packet Take 17 g by mouth daily. 02/22/20  Yes Anderson, Chelsey L, DO  risperiDONE (RISPERDAL) 1 MG tablet  Take 1 tablet (1 mg total) by mouth in the morning and at bedtime. 04/26/20 09/23/20 Yes [name], MD  senna (SENOKOT) 8.6 MG TABS tablet Take 1 tablet (8.6 mg total) by mouth daily. 02/22/20  Yes Anderson, Chelsey L, DO  simethicone (MYLICON) 80 MG chewable tablet Chew 80 mg by mouth 3 (three) times daily. 03/25/20  Yes [provider]  tiZANidine (ZANAFLEX) 2 MG tablet Take 1 tablet (2 mg total) by mouth daily as needed for muscle spasms. 02/21/20  Yes Anderson, Chelsey L, DO  TRADJENTA 5 MG TABS tablet TAKE (1) TABLET BY MOUTH ONCE DAILY. Patient taking differently: Take 5 mg by mouth daily. 02/09/20  Yes Nida, Marella Chimes, MD  zolpidem (AMBIEN) 10 MG tablet Take 10 mg by mouth at bedtime as needed for sleep.   Yes [provider]  simvastatin (ZOCOR) 20 MG tablet Take 20 mg by mouth daily.    [provider]    Physical Exam: Vitals:   04/18/20 1029 04/18/20 1031  BP:  134/62  Pulse:  86  Resp:  18  Temp:  97.8 F (36.6 C)  TempSrc:  Oral  SpO2:  95%  Weight: 57.6 kg   Height: 5\' 5"  (1.651 m)     Constitutional: Minimally distressed and appears confused Vitals:   04/18/20 1029 04/18/20 1031  BP:  134/62  Pulse:  86  Resp:  18  Temp:  97.8 F (36.6 C)  TempSrc:  Oral  SpO2:  95%  Weight: 57.6 kg   Height: 5\' 5"  (1.651 m)    Eyes: lids and conjunctivae normal Neck: normal, supple Respiratory: clear to auscultation bilaterally. Normal respiratory effort. No accessory muscle use.  Cardiovascular: Irregular rate and rhythm, no murmurs. Abdomen: Mildly distended, nontender on palpation.  No bowel sounds appreciated. Musculoskeletal:  No edema. Skin: no rashes, lesions, ulcers.  Psychiatric: Flat affect  Labs on Admission: I have personally reviewed following labs and imaging studies  CBC: Recent Labs  Lab 04/18/20 1105  WBC 9.5  NEUTROABS 7.9*  HGB 13.8  HCT 39.6  MCV 86.7  PLT 381   Basic Metabolic Panel: Recent Labs  Lab  04/18/20 1105  NA 134*  K 3.0*  CL 93*  CO2 29  GLUCOSE 97  BUN 12  CREATININE 0.61  CALCIUM 8.5*   GFR: Estimated Creatinine Clearance: 55.5 mL/min (by C-G formula  based on SCr of 0.61 mg/dL). Liver Function Tests: Recent Labs  Lab 04/18/20 1105  AST 18  ALT 13  ALKPHOS 53  BILITOT 0.7  PROT 6.7  ALBUMIN 3.4*   Recent Labs  Lab 04/18/20 1105  LIPASE 19   No results for input(s): AMMONIA in the last 168 hours. Coagulation Profile: No results for input(s): INR, PROTIME in the last 168 hours. Cardiac Enzymes: No results for input(s): CKTOTAL, CKMB, CKMBINDEX, TROPONINI in the last 168 hours. BNP (last 3 results) No results for input(s): PROBNP in the last 8760 hours. HbA1C: No results for input(s): HGBA1C in the last 72 hours. CBG: No results for input(s): GLUCAP in the last 168 hours. Lipid Profile: No results for input(s): CHOL, HDL, LDLCALC, TRIG, CHOLHDL, LDLDIRECT in the last 72 hours. Thyroid Function Tests: No results for input(s): TSH, T4TOTAL, FREET4, T3FREE, THYROIDAB in the last 72 hours. Anemia Panel: No results for input(s): VITAMINB12, FOLATE, FERRITIN, TIBC, IRON, RETICCTPCT in the last 72 hours. Urine analysis:    Component Value Date/Time   COLORURINE YELLOW 06/27/2017 1052   APPEARANCEUR CLEAR 06/27/2017 1052   APPEARANCEUR Cloudy (A) 05/01/2016 1525   LABSPEC 1.013 06/27/2017 1052   PHURINE 7.0 06/27/2017 1052   GLUCOSEU NEGATIVE 06/27/2017 1052   HGBUR NEGATIVE 06/27/2017 [place] 06/27/2017 1052   BILIRUBINUR Negative 05/01/2016 1525   KETONESUR NEGATIVE 06/27/2017 1052   PROTEINUR NEGATIVE 06/27/2017 1052   UROBILINOGEN 0.2 09/14/2014 1703   NITRITE NEGATIVE 06/27/2017 1052   LEUKOCYTESUR NEGATIVE 06/27/2017 1052   LEUKOCYTESUR 3+ (A) 05/01/2016 1525    Radiological Exams on Admission: CT Abdomen Pelvis W Contrast  Result Date: 04/18/2020 CLINICAL DATA:  Nausea, vomiting, abdominal pain EXAM: CT ABDOMEN AND  PELVIS WITH CONTRAST TECHNIQUE: Multidetector CT imaging of the abdomen and pelvis was performed using the standard protocol following bolus administration of intravenous contrast. CONTRAST:  142mL OMNIPAQUE IOHEXOL 300 MG/ML  SOLN COMPARISON:  03/28/2017 FINDINGS: Lower chest: No acute abnormality. Hepatobiliary: No solid liver abnormality is seen. Definitively benign subcapsular hemangioma of the right lobe of the liver, unchanged (series 2, image 21). No gallstones, gallbladder wall thickening, or biliary dilatation. Pancreas: Unremarkable. No pancreatic ductal dilatation or surrounding inflammatory changes. Spleen: Normal in size without significant abnormality. Adrenals/Urinary Tract: Adrenal glands are unremarkable. Kidneys are normal, without renal calculi, solid lesion, or hydronephrosis. Bladder is unremarkable. Stomach/Bowel: The stomach is fluid filled and the proximal small bowel is mildly distended, largest loops measuring up to 3.8 cm. There is an abrupt caliber change of the mid to distal small bowel in the midline ventral abdomen (series 2, image 53, series 5, image 43), with relatively decompressed, although still gas and fluid containing distal small bowel. Appendix appears normal. Large burden of stool and stool balls throughout the colon with numerous large stool balls in the rectum. Vascular/Lymphatic: Aortic atherosclerosis. No enlarged abdominal or pelvic lymph nodes. Reproductive: No mass or other significant abnormality. Other: No abdominal wall hernia or abnormality. Small volume ascites throughout the abdomen. Musculoskeletal: No acute or significant osseous findings. IMPRESSION: 1. The stomach is fluid filled and the proximal small bowel is mildly distended. There is an abrupt caliber change of the mid to distal small bowel in the midline ventral abdomen, with relatively decompressed, although still gas and fluid containing distal small bowel. Findings are concerning for partial or  developing small bowel obstruction. 2. Large burden of stool and stool balls throughout the colon with numerous large stool balls in the rectum.  Correlate for fecal impaction. 3. Small volume ascites. Electronically Signed   By: Eddie Candle M.D.   On: 04/18/2020 12:23    EKG: Independently reviewed. Afib/flutter 128bpm.  Assessment/Plan Active Problems:   SBO (small bowel obstruction) (HCC)    SBO  -It is felt that NG to would not be safe for the patient at this time therefore will be avoided -N.p.o. except ice chips -IV fluid -Focus on goal potassium of 4 and magnesium of 2 -Appreciate general surgery evaluation and recommendations -Zofran as needed for nausea and vomiting  Atrial fibrillation with RVR -Likely due to acute illness along with missed doses of Cardizem -Diltiazem drip initiated in the ED -Hold Eliquis and start on heparin drip -Monitor in stepdown unit  Hypokalemia -Replete IV -Recheck in a.m. along with magnesium  Type 2 diabetes -Prior hemoglobin A1c 9.2% on 01/2020 -Blood glucose currently stable -Maintain on SSI -Avoid oral agents as well as long-acting  GERD -PPI IV daily  History of schizophrenia -We will need to hold home medications -Haldol as needed for agitation  Chronic pain -Plan to hold oral pain medications -IV medications as needed  Iron deficiency anemia -Currently appears stable with no other bleeding -Follow CBC to monitor while on heparin drip -Hold home iron supplementation -Stool occult negative for bleeding per EDP   DVT prophylaxis: Heparin drip Code Status: Full Family Communication: None at bedside Disposition Plan:Admit for SBO eval/mgmt and afib with RVR mgmt Consults called:General Surgery Admission status: Inpatient, SDU   Sebrina Kessner D Darrien Laakso DO [facility]  If 7PM-7AM, please contact night-coverage [email or web address]  04/18/2020, 1:58 PM

## 2020-04-18 NOTE — Plan of Care (Signed)

## 2020-04-18 NOTE — Progress Notes (Signed)
ANTICOAGULATION CONSULT NOTE - Initial Consult  Pharmacy Consult for heparin Indication: atrial fibrillation  Allergies  Allergen Reactions  . Sulfa Antibiotics Rash    Patient Measurements: Height: 5\' 5"  (165.1 cm) Weight: 57.6 kg (127 lb) IBW/kg (Calculated) : 57 Heparin Dosing Weight: 57.6 kg  Vital Signs: Temp: 97.8 F (36.6 C) (03/27 1031) Temp Source: Oral (03/27 1031) BP: 134/62 (03/27 1031) Pulse Rate: 86 (03/27 1031)  Labs: Recent Labs    04/18/20 1105  HGB 13.8  HCT 39.6  PLT 250  CREATININE 0.61    Estimated Creatinine Clearance: 55.5 mL/min (by C-G formula based on SCr of 0.61 mg/dL).   Medical History: Past Medical History:  Diagnosis Date  . Anxiety   . Arthritis   . COVID-19 virus infection    COVID-19+ approx 01/24/19; asymptomatic course with full recovery  . Dependence on wheelchair    pivot/transfers  . Depression    History of psychosis and previous suicide attempt  . DVT, lower extremity, recurrent (HCC)    Long-term Coumadin per Dr. Legrand Rams  . Essential hypertension   . GERD (gastroesophageal reflux disease)   . Hemiplegia ([place]) 2010   Left side  . History of stroke    Acute infarct and right cerebral white matter small vessel disease 12/10  . Leg DVT (deep venous thromboembolism), acute ([place]) 2006  . Schizophrenia ([place])   . Stroke [facility])    left sided weakness  . Type 2 diabetes mellitus (HCC)     Medications:  (Not in a hospital admission)   Assessment: Pharmacy consulted to dose heparin in patient with atrial fibrillation.  Patient is on apixaban prior to admission with last dose given 3/26 @ 2000.  Will dose based on aPTT until correlation with heparin levels.  CBC WNL  Goal of Therapy:  Heparin level 0.3-0.7 units/ml aPTT 66-102 seconds Monitor platelets by anticoagulation protocol: Yes   Plan:  Start heparin infusion at 850 units/hr Check anti-Xa level in 8 hours and daily while on heparin Continue to monitor H&H  and platelets  Margot Ables, PharmD Clinical Pharmacist 04/18/2020 1:38 PM

## 2020-04-18 NOTE — ED Triage Notes (Signed)
Pt from [facility]. EMS called out of n/v x 4 days

## 2020-04-18 NOTE — ED Provider Notes (Signed)
[facility] EMERGENCY DEPARTMENT Provider Note   CSN: [id number] Arrival date & time: 04/18/20  1024     History Chief Complaint  Patient presents with  . Nausea  . Emesis    [name] is a 75 y.o. female.  Patient from [facility] [place] [facility].  EMS was called out for nausea and vomiting for 4 days.  Patient also with a complaint of some abdominal pain.  Patient has a history of diabetes schizophrenia atrial fibrillation.  Is wheelchair-bound according to notes.  Patient is a full code.  Patient had been on Coumadin in the past.  Appears to be on Eliquis now.  Patient seems to have some degree of confusion.  No diarrhea        Past Medical History:  Diagnosis Date  . Anxiety   . Arthritis   . COVID-19 virus infection    COVID-19+ approx 01/24/19; asymptomatic course with full recovery  . Dependence on wheelchair    pivot/transfers  . Depression    History of psychosis and previous suicide attempt  . DVT, lower extremity, recurrent (HCC)    Long-term Coumadin per Dr. Legrand Rams  . Essential hypertension   . GERD (gastroesophageal reflux disease)   . Hemiplegia ([place]) 2010   Left side  . History of stroke    Acute infarct and right cerebral white matter small vessel disease 12/10  . Leg DVT (deep venous thromboembolism), acute ([place]) 2006  . Schizophrenia ([place])   . Stroke [facility])    left sided weakness  . Type 2 diabetes mellitus [facility])     Patient Active Problem List   Diagnosis Date Noted  . Chest pain 02/21/2020  . Hemorrhoids 11/26/2019  . Atrial fibrillation with RVR ([place]) 10/04/2019  . GERD (gastroesophageal reflux disease) 08/26/2019  . Loss of weight 08/26/2019  . Constipation 09/09/2018  . Hypotension 03/21/2018  . Diarrhea 07/20/2017  . Primary osteoarthritis of right hip 04/18/2017  . Orthostasis 03/28/2017  . General weakness 03/28/2017  . History of cerebrovascular accident (CVA) with residual deficit 03/28/2017  . Schizophrenia ([place]) 03/28/2017   . Generalized weakness 03/28/2017  . Orthostatic hypotension   . IBS (irritable bowel syndrome) 10/16/2016  . Left hemiparesis ([place]) 10/06/2016  . Left-sided weakness 10/06/2016  . Chronic superficial gastritis without bleeding   . Stricture and stenosis of esophagus   . Nausea with vomiting 04/20/2016  . Abdominal pain, acute, bilateral lower quadrant 03/22/2016  . Lesion of right lobe of liver 03/22/2016  . Elevated troponin 02/13/2016  . IDA (iron deficiency anemia) 04/06/2015  . Mixed hyperlipidemia 11/06/2014  . Obesity due to excess calories 11/06/2014  . Sedentary lifestyle 11/06/2014  . Rectal bleeding 10/21/2014  . Encounter for therapeutic drug monitoring 03/24/2013  . Chronic anticoagulation 04/09/2010  . Type 2 diabetes mellitus with vascular disease ([place]) 03/24/2010  . Essential hypertension, benign 03/24/2010  . DVT, lower extremity, recurrent ([place]) 03/24/2010  . History of cardiovascular disorder 03/24/2010    Past Surgical History:  Procedure Laterality Date  . BACK SURGERY    . BIOPSY N/A 11/24/2014   Procedure: BIOPSY;  Surgeon: Danie Binder, MD;  Location: [facility] ORS;  Service: Endoscopy;  Laterality: N/A;  . BIOPSY  05/17/2016   Procedure: BIOPSY;  Surgeon: Danie Binder, MD;  Location: [facility] ENDO SUITE;  Service: Endoscopy;;  gastric biopsy  . COLONOSCOPY WITH PROPOFOL N/A 11/24/2014   Dr. Rudie Meyer polyps removed/moderate sized internal hemorrhoids, tubular adenomas. Next surveillance in 3 years  . ESOPHAGOGASTRODUODENOSCOPY (  EGD) WITH PROPOFOL N/A 11/24/2014   Dr. Clayburn Pert HH/patent stricture at the gastroesophageal junction, mild non-erosive gastritis, path negative for H.pylori or celiac sprue  . ESOPHAGOGASTRODUODENOSCOPY (EGD) WITH PROPOFOL N/A 05/17/2016   Procedure: ESOPHAGOGASTRODUODENOSCOPY (EGD) WITH PROPOFOL;  Surgeon: Danie Binder, MD;  Location: [facility] ENDO SUITE;  Service: Endoscopy;  Laterality: N/A;  12:45pm  . GIVENS CAPSULE STUDY N/A 12/11/2014    MULTILPLE EROSION IN the stomach WITH ACTIVE OOZING. OCCASIONAL EROSIONS AND RARE ULCER SEEN IN PROXIMAL SMALL BOWEL . No masses or AVMs SEEN. NO OLD BLOOD OR FRESH BLOOD SEEN.   . POLYPECTOMY N/A 11/24/2014   Procedure: POLYPECTOMY;  Surgeon: Danie Binder, MD;  Location: [facility] ORS;  Service: Endoscopy;  Laterality: N/A;  . SAVORY DILATION N/A 05/17/2016   Procedure: SAVORY DILATION;  Surgeon: Danie Binder, MD;  Location: [facility] ENDO SUITE;  Service: Endoscopy;  Laterality: N/A;     OB History   No obstetric history on file.     Family History  Problem Relation Age of Onset  . Hypertension Mother   . Colon cancer Neg Hx     Social History   Tobacco Use  . Smoking status: Former Smoker    Packs/day: 0.25    Years: 20.00    Pack years: 5.00    Types: Cigarettes    Quit date: 01/24/1995    Years since quitting: 25.2  . Smokeless tobacco: Never Used  Vaping Use  . Vaping Use: Never used  Substance Use Topics  . Alcohol use: No    Alcohol/week: 0.0 standard drinks  . Drug use: No    Home Medications Prior to Admission medications   Medication Sig Start Date End Date Taking? Authorizing Provider  ABILIFY 2 MG tablet Take 1 tablet (2 mg total) by mouth at bedtime. 04/26/20 09/23/20  [name], MD  acetaminophen (TYLENOL) 325 MG tablet Take 650 mg by mouth 3 (three) times daily as needed for mild pain or moderate pain.     [provider]  apixaban (ELIQUIS) 5 MG TABS tablet Take 1 tablet (5 mg total) by mouth 2 (two) times daily. Patient not taking: Reported on 02/23/2020 02/21/20   Doristine Mango L, DO  atorvastatin (LIPITOR) 40 MG tablet Take 1 tablet (40 mg total) by mouth at bedtime. Patient not taking: Reported on 02/23/2020 02/21/20   Doristine Mango L, DO  calcium carbonate (TUMS - DOSED IN MG ELEMENTAL CALCIUM) 500 MG chewable tablet Chew 1 tablet by mouth 4 (four) times daily as needed for heartburn.    [provider]  Carboxymethylcellulose Sodium  (ARTIFICIAL TEARS OP) Place 1 drop into both eyes 3 (three) times daily.    [provider]  Cholecalciferol (VITAMIN D3) 50 MCG (2000 UT) TABS Take 2,000 Units by mouth daily.    [provider]  clonazePAM (KLONOPIN) 0.5 MG tablet Take 1 tablet (0.5 mg total) by mouth 3 (three) times daily as needed for anxiety. 02/21/20   Anderson, Chelsey L, DO  diltiazem (CARDIZEM CD) 120 MG 24 hr capsule Take 1 capsule (120 mg total) by mouth daily. 12/23/19   Verta Ellen., NP  docusate sodium (COLACE) 100 MG capsule Take 200 mg by mouth daily.    [provider]  DULoxetine (CYMBALTA) 60 MG capsule Take 60 mg by mouth daily.    [provider]  EASYMAX TEST test strip USE TO CHECK BLOOD SUGAR TWICE DAILY. Patient taking differently: 1 each by Other route 2 (two) times  daily. 01/21/20   Cassandria Anger, MD  feeding supplement, GLUCERNA SHAKE, (GLUCERNA SHAKE) LIQD Take 237 mLs by mouth 3 (three) times daily between meals.    [provider]  FERREX 150 150 MG capsule Take 150 mg by mouth daily.    [provider]  gabapentin (NEURONTIN) 300 MG capsule Take 1 capsule (300 mg total) by mouth 3 (three) times daily as needed. 02/21/20   Anderson, Chelsey L, DO  HYDROcodone-acetaminophen (NORCO) 10-325 MG tablet Take 1 tablet by mouth 3 (three) times daily as needed. 02/21/20   Ouida Sills, Chelsey L, DO  hydrocortisone (ANUSOL-HC) 2.5 % rectal cream Place 1 application rectally 2 (two) times daily as needed (for rectal bleeding).    [provider]  insulin detemir (LEVEMIR FLEXTOUCH) 100 UNIT/ML FlexPen Inject 38 Units into the skin at bedtime. 11/06/19   Cassandria Anger, MD  Lancets 28G MISC USE TO CHECK BLOOD SUGAR TWICE DAILY. 12/25/19   Brita Romp, NP  latanoprost (XALATAN) 0.005 % ophthalmic solution Place 1 drop into both eyes at bedtime.    [provider]  loratadine (CLARITIN) 10 MG tablet Take 10 mg by mouth  daily.    [provider]  lubiprostone (AMITIZA) 24 MCG capsule Take 1 capsule (24 mcg total) by mouth 2 (two) times daily with a meal. 07/02/19   Erenest Rasher, PA-C  metoprolol succinate (TOPROL-XL) 25 MG 24 hr tablet Take 1 tablet (25 mg total) by mouth daily. 11/27/19   Verta Ellen., NP  midodrine (PROAMATINE) 10 MG tablet TAKE (1) TABLET BY MOUTH (3) TIMES DAILY. Patient taking differently: Take 10 mg by mouth 3 (three) times daily. 02/09/20   Verta Ellen., NP  mirabegron ER (MYRBETRIQ) 25 MG TB24 tablet Take 25 mg by mouth daily.    [provider]  mirtazapine (REMERON) 30 MG tablet Take 30 mg by mouth at bedtime.    [provider]  ondansetron (ZOFRAN) 4 MG tablet Take 1 tablet (4 mg total) by mouth 4 (four) times daily -  before meals and at bedtime. Patient taking differently: Take 4 mg by mouth 2 (two) times daily as needed for nausea or vomiting. 04/20/16   Annitta Needs, NP  pantoprazole (PROTONIX) 40 MG tablet Take 1 tablet (40 mg total) by mouth daily. 02/21/20   Anderson, Chelsey L, DO  polyethylene glycol (MIRALAX / GLYCOLAX) 17 g packet Take 17 g by mouth daily. 02/22/20   Anderson, Chelsey L, DO  risperiDONE (RISPERDAL) 1 MG tablet Take 1 tablet (1 mg total) by mouth in the morning and at bedtime. 04/26/20 09/23/20  [name], MD  senna (SENOKOT) 8.6 MG TABS tablet Take 1 tablet (8.6 mg total) by mouth daily. Patient not taking: Reported on 02/23/2020 02/22/20   Doristine Mango L, DO  simvastatin (ZOCOR) 20 MG tablet Take 20 mg by mouth daily.    [provider]  tiZANidine (ZANAFLEX) 2 MG tablet Take 1 tablet (2 mg total) by mouth daily as needed for muscle spasms. 02/21/20   Anderson, Chelsey L, DO  TRADJENTA 5 MG TABS tablet TAKE (1) TABLET BY MOUTH ONCE DAILY. Patient taking differently: Take 5 mg by mouth daily. 02/09/20   Cassandria Anger, MD  zolpidem (AMBIEN) 10 MG tablet Take 10 mg by mouth at bedtime as needed for  sleep.    [provider]    Allergies    Sulfa antibiotics  Review of Systems   Review of  Systems  Constitutional: Negative for chills and fever.  HENT: Negative for rhinorrhea and sore throat.   Eyes: Negative for visual disturbance.  Respiratory: Negative for cough and shortness of breath.   Cardiovascular: Negative for chest pain and leg swelling.  Gastrointestinal: Positive for abdominal pain, nausea and vomiting. Negative for diarrhea.  Genitourinary: Negative for dysuria.  Musculoskeletal: Negative for back pain and neck pain.  Skin: Negative for rash.  Neurological: Negative for dizziness, light-headedness and headaches.  Hematological: Does not bruise/bleed easily.  Psychiatric/Behavioral: Negative for confusion.    Physical Exam Updated Vital Signs BP 134/62   Pulse 86   Temp 97.8 F (36.6 C) (Oral)   Resp 18   Ht 1.651 m (5\' 5" )   Wt 57.6 kg   SpO2 95%   BMI 21.13 kg/m   Physical Exam Vitals and nursing note reviewed.  Constitutional:      General: She is not in acute distress.    Appearance: Normal appearance. She is well-developed.  HENT:     Head: Normocephalic and atraumatic.     Mouth/Throat:     Mouth: Mucous membranes are dry.  Eyes:     Extraocular Movements: Extraocular movements intact.     Conjunctiva/sclera: Conjunctivae normal.     Pupils: Pupils are equal, round, and reactive to light.  Cardiovascular:     Rate and Rhythm: Tachycardia present. Rhythm irregular.     Heart sounds: No murmur heard.   Pulmonary:     Effort: Pulmonary effort is normal. No respiratory distress.     Breath sounds: Normal breath sounds.  Abdominal:     General: There is distension.     Palpations: Abdomen is soft.     Tenderness: There is no abdominal tenderness.  Genitourinary:    Rectum: Guaiac result negative.     Comments: Stool in the vault brown in color.  Soft.  Not consistent with impaction Musculoskeletal:        General: No swelling.  Normal range of motion.     Cervical back: Neck supple.  Skin:    General: Skin is warm and dry.     Capillary Refill: Capillary refill takes less than 2 seconds.  Neurological:     Mental Status: She is alert. Mental status is at baseline.     Comments: Weakness in both lower extremities but she will move them.  I think this is baseline since she is wheelchair-bound     ED Results / Procedures / Treatments   Labs (all labs ordered are listed, but only abnormal results are displayed) Labs Reviewed  CBC WITH DIFFERENTIAL/PLATELET - Abnormal; Notable for the following components:      Result Value   Neutro Abs 7.9 (*)    All other components within normal limits  COMPREHENSIVE METABOLIC PANEL - Abnormal; Notable for the following components:   Sodium 134 (*)    Potassium 3.0 (*)    Chloride 93 (*)    Calcium 8.5 (*)    Albumin 3.4 (*)    All other components within normal limits  LIPASE, BLOOD  URINALYSIS, ROUTINE W REFLEX MICROSCOPIC    EKG EKG Interpretation  Date/Time:  Sunday April 18 2020 10:33:04 EDT Ventricular Rate:  128 PR Interval:    QRS Duration: 96 QT Interval:  336 QTC Calculation: 429 R Axis:   91 Text Interpretation: Atrial flutter Probable inferior infarct, old ST depr, consider ischemia, anterolateral lds ? artifact Confirmed by Fredia Sorrow [phone]) on 04/18/2020 10:50:41 AM  Radiology CT Abdomen Pelvis W Contrast  Result Date: 04/18/2020 CLINICAL DATA:  Nausea, vomiting, abdominal pain EXAM: CT ABDOMEN AND PELVIS WITH CONTRAST TECHNIQUE: Multidetector CT imaging of the abdomen and pelvis was performed using the standard protocol following bolus administration of intravenous contrast. CONTRAST:  157mL OMNIPAQUE IOHEXOL 300 MG/ML  SOLN COMPARISON:  03/28/2017 FINDINGS: Lower chest: No acute abnormality. Hepatobiliary: No solid liver abnormality is seen. Definitively benign subcapsular hemangioma of the right lobe of the liver, unchanged (series 2,  image 21). No gallstones, gallbladder wall thickening, or biliary dilatation. Pancreas: Unremarkable. No pancreatic ductal dilatation or surrounding inflammatory changes. Spleen: Normal in size without significant abnormality. Adrenals/Urinary Tract: Adrenal glands are unremarkable. Kidneys are normal, without renal calculi, solid lesion, or hydronephrosis. Bladder is unremarkable. Stomach/Bowel: The stomach is fluid filled and the proximal small bowel is mildly distended, largest loops measuring up to 3.8 cm. There is an abrupt caliber change of the mid to distal small bowel in the midline ventral abdomen (series 2, image 53, series 5, image 43), with relatively decompressed, although still gas and fluid containing distal small bowel. Appendix appears normal. Large burden of stool and stool balls throughout the colon with numerous large stool balls in the rectum. Vascular/Lymphatic: Aortic atherosclerosis. No enlarged abdominal or pelvic lymph nodes. Reproductive: No mass or other significant abnormality. Other: No abdominal wall hernia or abnormality. Small volume ascites throughout the abdomen. Musculoskeletal: No acute or significant osseous findings. IMPRESSION: 1. The stomach is fluid filled and the proximal small bowel is mildly distended. There is an abrupt caliber change of the mid to distal small bowel in the midline ventral abdomen, with relatively decompressed, although still gas and fluid containing distal small bowel. Findings are concerning for partial or developing small bowel obstruction. 2. Large burden of stool and stool balls throughout the colon with numerous large stool balls in the rectum. Correlate for fecal impaction. 3. Small volume ascites. Electronically Signed   By: Eddie Candle M.D.   On: 04/18/2020 12:23    Procedures Procedures   CRITICAL CARE Performed by: Fredia Sorrow Total critical care time: 45 minutes Critical care time was exclusive of separately billable procedures  and treating other patients. Critical care was necessary to treat or prevent imminent or life-threatening deterioration. Critical care was time spent personally by me on the following activities: development of treatment plan with patient and/or surrogate as well as nursing, discussions with consultants, evaluation of patient's response to treatment, examination of patient, obtaining history from patient or surrogate, ordering and performing treatments and interventions, ordering and review of laboratory studies, ordering and review of radiographic studies, pulse oximetry and re-evaluation of patient's condition.   Medications Ordered in ED Medications  0.9 %  sodium chloride infusion ( Intravenous New Bag/Given 04/18/20 1154)  HYDROmorphone (DILAUDID) injection 0.5 mg (has no administration in time range)  diltiazem (CARDIZEM) 125 mg in dextrose 5% 125 mL (1 mg/mL) infusion (has no administration in time range)  potassium chloride 10 mEq in 100 mL IVPB (has no administration in time range)  sodium chloride 0.9 % bolus 500 mL (500 mLs Intravenous New Bag/Given 04/18/20 1118)  ondansetron (ZOFRAN) injection 4 mg (4 mg Intravenous Given 04/18/20 1152)  iohexol (OMNIPAQUE) 300 MG/ML solution 100 mL (100 mLs Intravenous Contrast Given 04/18/20 1215)    ED Course  I have reviewed the triage vital signs and the nursing notes.  Pertinent labs & imaging results that were available during my care of the patient were reviewed by me  and considered in my medical decision making (see chart for details).    MDM Rules/Calculators/A&P                          Patient symptoms with the persistent nausea and vomiting some abdominal distention and some abdominal discomfort despite her history of schizophrenia.  She seems to be able to explain things pretty well.  But there could be some degree of confusion because she repeats questions.  Patient CT scan of the abdomen raises concerns for partial small bowel  obstruction.  Also raised evidence of constipation.  Some question about impaction.  But on rectal exam stool was soft in the rectum.  It is heme-negative.  She with persistent vomiting here.  I feel that she needs admission for partial small bowel obstruction will discuss with general surgery Dr. Arnoldo Morale.  Not sure whether she will tolerate an NG tube.  I think she will remove it produced some degree of confusion.  Patient's heart rhythm here is also been in and out of atrial fib.  Patient has a history of atrial fib she is on Eliquis.  We will start her on diltiazem drip.  She is normally on diltiazem orally but I bet with his nausea and vomiting for the past 4 days she probably has not been getting any of that.  And for her hypokalemia because of the nausea and vomiting we will give 10 mEq IV.  Gust with Dr. Arnoldo Morale from general surgery he will follow.  He concurs to keep the NG tube out.  She is on Eliquis so that will need to be held in case she needs surgery.    Final Clinical Impression(s) / ED Diagnoses Final diagnoses:  SBO (small bowel obstruction) (HCC)  Hypokalemia  Constipation, unspecified constipation type  Atrial fibrillation with RVR [facility])    Rx / DC Orders ED Discharge Orders    None       Fredia Sorrow, MD 04/18/20 1323

## 2020-04-19 DIAGNOSIS — K56609 Unspecified intestinal obstruction, unspecified as to partial versus complete obstruction: Secondary | ICD-10-CM | POA: Diagnosis not present

## 2020-04-19 LAB — CBC
HCT: 35.1 % — ABNORMAL LOW (ref 36.0–46.0)
Hemoglobin: 12.1 g/dL (ref 12.0–15.0)
MCH: 30.4 pg (ref 26.0–34.0)
MCHC: 34.5 g/dL (ref 30.0–36.0)
MCV: 88.2 fL (ref 80.0–100.0)
Platelets: 219 10*3/uL (ref 150–400)
RBC: 3.98 MIL/uL (ref 3.87–5.11)
RDW: 12.4 % (ref 11.5–15.5)
WBC: 6.9 10*3/uL (ref 4.0–10.5)
nRBC: 0 % (ref 0.0–0.2)

## 2020-04-19 LAB — GLUCOSE, CAPILLARY
Glucose-Capillary: 133 mg/dL — ABNORMAL HIGH (ref 70–99)
Glucose-Capillary: 160 mg/dL — ABNORMAL HIGH (ref 70–99)
Glucose-Capillary: 177 mg/dL — ABNORMAL HIGH (ref 70–99)
Glucose-Capillary: 133 mg/dL — ABNORMAL HIGH (ref 70–99)
Glucose-Capillary: 129 mg/dL — ABNORMAL HIGH (ref 70–99)

## 2020-04-19 LAB — COMPREHENSIVE METABOLIC PANEL
ALT: 11 U/L (ref 0–44)
AST: 15 U/L (ref 15–41)
Albumin: 2.9 g/dL — ABNORMAL LOW (ref 3.5–5.0)
Alkaline Phosphatase: 46 U/L (ref 38–126)
Anion gap: 16 — ABNORMAL HIGH (ref 5–15)
BUN: 14 mg/dL (ref 8–23)
CO2: 20 mmol/L — ABNORMAL LOW (ref 22–32)
Calcium: 7.7 mg/dL — ABNORMAL LOW (ref 8.9–10.3)
Chloride: 101 mmol/L (ref 98–111)
Creatinine, Ser: 0.6 mg/dL (ref 0.44–1.00)
GFR, Estimated: >60 mL/min (ref >60)
Glucose, Bld: 137 mg/dL — ABNORMAL HIGH (ref 70–99)
Potassium: 3 mmol/L — ABNORMAL LOW (ref 3.5–5.1)
Sodium: 137 mmol/L (ref 135–145)
Total Bilirubin: 0.9 mg/dL (ref 0.3–1.2)
Total Protein: 5.9 g/dL — ABNORMAL LOW (ref 6.5–8.1)

## 2020-04-19 LAB — HEPARIN LEVEL (UNFRACTIONATED): Heparin Unfractionated: 1.96 IU/mL — ABNORMAL HIGH (ref 0.30–0.70)

## 2020-04-19 LAB — T4, FREE: Free T4: 1.52 ng/dL — ABNORMAL HIGH (ref 0.61–1.12)

## 2020-04-19 LAB — APTT
aPTT: 63 seconds — ABNORMAL HIGH (ref 24–36)
aPTT: 71 seconds — ABNORMAL HIGH (ref 24–36)

## 2020-04-19 LAB — MAGNESIUM: Magnesium: 1.8 mg/dL (ref 1.7–2.4)

## 2020-04-19 MED ORDER — MAGNESIUM SULFATE 2 GM/50ML IV SOLN
2.0000 g | Freq: Once | INTRAVENOUS | Status: AC
  Administered 2020-04-19: 2 g via INTRAVENOUS
  Filled 2020-04-19: qty 50

## 2020-04-19 MED ORDER — MIRTAZAPINE 30 MG PO TABS
30.0000 mg | ORAL_TABLET | Freq: Every day | ORAL | Status: DC
Start: 2020-04-19 — End: 2020-04-26
  Administered 2020-04-19 – 2020-04-25 (×7): 30 mg via ORAL
  Filled 2020-04-19 (×7): qty 1

## 2020-04-19 MED ORDER — MILK AND MOLASSES ENEMA
1.0000 | Freq: Two times a day (BID) | RECTAL | Status: DC | PRN
  Administered 2020-04-19: 240 mL via RECTAL

## 2020-04-19 MED ORDER — CLONAZEPAM 0.5 MG PO TABS
0.5000 mg | ORAL_TABLET | Freq: Three times a day (TID) | ORAL | Status: DC | PRN
  Administered 2020-04-19 – 2020-04-26 (×10): 0.5 mg via ORAL
  Filled 2020-04-19 (×11): qty 1

## 2020-04-19 MED ORDER — METOPROLOL SUCCINATE ER 25 MG PO TB24
25.0000 mg | ORAL_TABLET | Freq: Every day | ORAL | Status: DC
  Administered 2020-04-19 – 2020-04-22 (×4): 25 mg via ORAL
  Filled 2020-04-19 (×4): qty 1

## 2020-04-19 MED ORDER — POTASSIUM CHLORIDE 10 MEQ/100ML IV SOLN
INTRAVENOUS | Status: AC
  Administered 2020-04-19: 10 meq via INTRAVENOUS
  Filled 2020-04-19: qty 100

## 2020-04-19 MED ORDER — MIRABEGRON ER 25 MG PO TB24
25.0000 mg | ORAL_TABLET | Freq: Every day | ORAL | Status: DC
  Administered 2020-04-19 – 2020-04-26 (×8): 25 mg via ORAL
  Filled 2020-04-19 (×8): qty 1

## 2020-04-19 MED ORDER — POTASSIUM CHLORIDE 10 MEQ/100ML IV SOLN
10.0000 meq | INTRAVENOUS | Status: DC
  Filled 2020-04-19: qty 100

## 2020-04-19 MED ORDER — POTASSIUM CHLORIDE 10 MEQ/100ML IV SOLN
10.0000 meq | INTRAVENOUS | Status: AC
  Administered 2020-04-19 (×5): 10 meq via INTRAVENOUS
  Filled 2020-04-19 (×4): qty 100

## 2020-04-19 MED ORDER — POTASSIUM CHLORIDE 10 MEQ/100ML IV SOLN: 10.0000 meq | Freq: Once | INTRAVENOUS | Status: AC

## 2020-04-19 MED ORDER — ATORVASTATIN CALCIUM 40 MG PO TABS
40.0000 mg | ORAL_TABLET | Freq: Every day | ORAL | Status: DC
  Administered 2020-04-19 – 2020-04-25 (×7): 40 mg via ORAL
  Filled 2020-04-19 (×7): qty 1

## 2020-04-19 MED ORDER — LABETALOL HCL 5 MG/ML IV SOLN: 5.0000 mg | Freq: Four times a day (QID) | INTRAVENOUS | Status: DC | PRN

## 2020-04-19 MED ORDER — ARIPIPRAZOLE 2 MG PO TABS
2.0000 mg | ORAL_TABLET | Freq: Every day | ORAL | Status: DC
  Administered 2020-04-19 – 2020-04-25 (×7): 2 mg via ORAL
  Filled 2020-04-19 (×12): qty 1

## 2020-04-19 MED ORDER — DULOXETINE HCL 60 MG PO CPEP
60.0000 mg | ORAL_CAPSULE | Freq: Every day | ORAL | Status: DC
Start: 2020-04-19 — End: 2020-04-26
  Administered 2020-04-19 – 2020-04-26 (×8): 60 mg via ORAL
  Filled 2020-04-19 (×8): qty 1

## 2020-04-19 MED ORDER — DILTIAZEM HCL ER COATED BEADS 120 MG PO CP24
120.0000 mg | ORAL_CAPSULE | Freq: Every day | ORAL | Status: DC
  Administered 2020-04-19 – 2020-04-21 (×3): 120 mg via ORAL
  Filled 2020-04-19 (×3): qty 1

## 2020-04-19 MED ORDER — RISPERIDONE 1 MG PO TABS
1.0000 mg | ORAL_TABLET | Freq: Two times a day (BID) | ORAL | Status: DC
  Administered 2020-04-19 – 2020-04-26 (×15): 1 mg via ORAL
  Filled 2020-04-19 (×15): qty 1

## 2020-04-19 MED ORDER — PROCHLORPERAZINE MALEATE 5 MG PO TABS
10.0000 mg | ORAL_TABLET | Freq: Four times a day (QID) | ORAL | Status: DC | PRN
  Administered 2020-04-19 – 2020-04-22 (×4): 10 mg via ORAL
  Filled 2020-04-19 (×4): qty 2

## 2020-04-19 MED ORDER — NAPHAZOLINE-GLYCERIN 0.012-0.2 % OP SOLN
1.0000 [drp] | Freq: Four times a day (QID) | OPHTHALMIC | Status: DC | PRN
  Administered 2020-04-24: 1 [drp] via OPHTHALMIC
  Filled 2020-04-19: qty 15

## 2020-04-19 MED ORDER — ACETAMINOPHEN 325 MG PO TABS: 650.0000 mg | ORAL_TABLET | Freq: Three times a day (TID) | ORAL | Status: DC | PRN

## 2020-04-19 NOTE — Progress Notes (Addendum)
ANTICOAGULATION CONSULT NOTE -   Pharmacy Consult for heparin Indication: atrial fibrillation  Allergies  Allergen Reactions  . Sulfa Antibiotics Rash    Patient Measurements: Height: 5\' 5"  (165.1 cm) Weight: 57.6 kg (127 lb) IBW/kg (Calculated) : 57 Heparin Dosing Weight: 57.6 kg  Vital Signs: Temp: 98.4 F (36.9 C) (03/28 0400) Temp Source: Oral (03/28 0400) BP: 156/80 (03/28 0600) Pulse Rate: 85 (03/28 0600)  Labs: Recent Labs    04/18/20 1105 04/18/20 1351 04/18/20 2210 04/19/20 0422  HGB 13.8  --   --  12.1  HCT 39.6  --   --  35.1*  PLT 250  --   --  219  APTT  --  33 80* 63*  HEPARINUNFRC  --  >2.20*  --   --   CREATININE 0.61  --   --  0.60    Estimated Creatinine Clearance: 55.5 mL/min (by C-G formula based on SCr of 0.6 mg/dL).   Medical History: Past Medical History:  Diagnosis Date  . Anxiety   . Arthritis   . COVID-19 virus infection    COVID-19+ approx 01/24/19; asymptomatic course with full recovery  . Dependence on wheelchair    pivot/transfers  . Depression    History of psychosis and previous suicide attempt  . DVT, lower extremity, recurrent (HCC)    Long-term Coumadin per Dr. Legrand Rams  . Essential hypertension   . GERD (gastroesophageal reflux disease)   . Hemiplegia ([place]) 2010   Left side  . History of stroke    Acute infarct and right cerebral white matter small vessel disease 12/10  . Leg DVT (deep venous thromboembolism), acute ([place]) 2006  . Schizophrenia ([place])   . Stroke [facility])    left sided weakness  . Type 2 diabetes mellitus (HCC)     Medications:  Medications Prior to Admission  Medication Sig Dispense Refill Last Dose  . [START ON 04/26/2020] ABILIFY 2 MG tablet Take 1 tablet (2 mg total) by mouth at bedtime. 30 tablet 4 04/17/2020 at Unknown time  . acetaminophen (TYLENOL) 325 MG tablet Take 650 mg by mouth 3 (three) times daily as needed for mild pain or moderate pain.      Marland Kitchen apixaban (ELIQUIS) 5 MG TABS tablet Take 1  tablet (5 mg total) by mouth 2 (two) times daily. 60 tablet 0 04/17/2020 at 2000  . atorvastatin (LIPITOR) 40 MG tablet Take 1 tablet (40 mg total) by mouth at bedtime. 60 tablet 0 04/17/2020 at Unknown time  . calcium carbonate (TUMS - DOSED IN MG ELEMENTAL CALCIUM) 500 MG chewable tablet Chew 1 tablet by mouth 4 (four) times daily as needed for heartburn.     . Carboxymethylcellulose Sodium (ARTIFICIAL TEARS OP) Place 1 drop into both eyes 3 (three) times daily.   04/17/2020 at Unknown time  . Cholecalciferol (VITAMIN D3) 50 MCG (2000 UT) TABS Take 2,000 Units by mouth daily.   04/17/2020 at Unknown time  . clonazePAM (KLONOPIN) 0.5 MG tablet Take 1 tablet (0.5 mg total) by mouth 3 (three) times daily as needed for anxiety. 90 tablet 2 04/17/2020 at Unknown time  . diltiazem (CARDIZEM CD) 120 MG 24 hr capsule Take 1 capsule (120 mg total) by mouth daily. 90 capsule 1 04/17/2020 at Unknown time  . DULoxetine (CYMBALTA) 60 MG capsule Take 60 mg by mouth daily.   04/17/2020 at Unknown time  . FERREX 150 150 MG capsule Take 150 mg by mouth daily.   04/17/2020 at Unknown time  .  gabapentin (NEURONTIN) 300 MG capsule Take 1 capsule (300 mg total) by mouth 3 (three) times daily as needed.   04/17/2020 at Unknown time  . HYDROcodone-acetaminophen (NORCO) 10-325 MG tablet Take 1 tablet by mouth 3 (three) times daily as needed. 30 tablet  04/17/2020 at Unknown time  . hydrocortisone (ANUSOL-HC) 2.5 % rectal cream Place 1 application rectally 2 (two) times daily as needed (for rectal bleeding).     . hydrOXYzine (ATARAX/VISTARIL) 25 MG tablet Take 25 mg by mouth 3 (three) times daily.   04/17/2020 at Unknown time  . insulin detemir (LEVEMIR FLEXTOUCH) 100 UNIT/ML FlexPen Inject 38 Units into the skin at bedtime. (Patient taking differently: Inject 42 Units into the skin at bedtime.) 15 mL 3 04/17/2020 at Unknown time  . latanoprost (XALATAN) 0.005 % ophthalmic solution Place 1 drop into both eyes at bedtime.   04/17/2020 at  Unknown time  . loratadine (CLARITIN) 10 MG tablet Take 10 mg by mouth daily.   04/17/2020 at Unknown time  . lubiprostone (AMITIZA) 24 MCG capsule Take 1 capsule (24 mcg total) by mouth 2 (two) times daily with a meal. 60 capsule 5 04/17/2020 at Unknown time  . metoprolol succinate (TOPROL-XL) 25 MG 24 hr tablet Take 1 tablet (25 mg total) by mouth daily. 90 tablet 3 04/17/2020 at 0800  . midodrine (PROAMATINE) 10 MG tablet TAKE (1) TABLET BY MOUTH (3) TIMES DAILY. (Patient taking differently: Take 10 mg by mouth 3 (three) times daily.) 90 tablet 3 04/17/2020 at Unknown time  . mirabegron ER (MYRBETRIQ) 25 MG TB24 tablet Take 25 mg by mouth daily.   04/17/2020 at Unknown time  . mirtazapine (REMERON) 30 MG tablet Take 30 mg by mouth at bedtime.   04/17/2020 at Unknown time  . ondansetron (ZOFRAN) 4 MG tablet Take 1 tablet (4 mg total) by mouth 4 (four) times daily -  before meals and at bedtime. (Patient taking differently: Take 4 mg by mouth 2 (two) times daily as needed for nausea or vomiting.) 120 tablet 3 04/17/2020 at Unknown time  . pantoprazole (PROTONIX) 40 MG tablet Take 1 tablet (40 mg total) by mouth daily. 270 tablet 3 04/17/2020 at Unknown time  . polyethylene glycol (MIRALAX / GLYCOLAX) 17 g packet Take 17 g by mouth daily. 14 each 0 04/17/2020 at Unknown time  . [START ON 04/26/2020] risperiDONE (RISPERDAL) 1 MG tablet Take 1 tablet (1 mg total) by mouth in the morning and at bedtime. 60 tablet 4 04/17/2020 at Unknown time  . senna (SENOKOT) 8.6 MG TABS tablet Take 1 tablet (8.6 mg total) by mouth daily. 120 tablet 0 04/17/2020 at Unknown time  . simethicone (MYLICON) 80 MG chewable tablet Chew 80 mg by mouth 3 (three) times daily.   04/17/2020 at Unknown time  . tiZANidine (ZANAFLEX) 2 MG tablet Take 1 tablet (2 mg total) by mouth daily as needed for muscle spasms. 30 tablet    . TRADJENTA 5 MG TABS tablet TAKE (1) TABLET BY MOUTH ONCE DAILY. (Patient taking differently: Take 5 mg by mouth daily.)  30 tablet 0 04/17/2020 at Unknown time  . zolpidem (AMBIEN) 10 MG tablet Take 10 mg by mouth at bedtime as needed for sleep.   04/17/2020 at Unknown time  . simvastatin (ZOCOR) 20 MG tablet Take 20 mg by mouth daily.       Assessment: Pharmacy consulted to dose heparin in patient with atrial fibrillation.  Patient is on apixaban prior to admission with last dose given 3/26 @  2000.  Will dose based on aPTT until correlation with heparin levels.  CBC WNL APTT 63- slightly subtherapeutic  Goal of Therapy:  Heparin level 0.3-0.7 units/ml aPTT 66-102 seconds Monitor platelets by anticoagulation protocol: Yes   Plan:  Increase heparin infusion to 950 units/hr Check APTT in 8 hours and daily while on heparin Continue to monitor H&H and platelets.   Margot Ables, PharmD Clinical Pharmacist 04/19/2020 7:53 AM

## 2020-04-19 NOTE — TOC Initial Note (Signed)
Transition of Care [facility]) - Initial/Assessment Note    Patient Details  Name: [name] MRN: [id number] Date of Birth: Feb 12, 1945  Transition of Care [facility]) CM/SW Contact:    Shade Flood, LCSW Phone Number: 04/19/2020, 2:34 PM  Clinical Narrative:                  Pt admitted from [facility]. Spoke with Tammy at [facility] today to update. Tammy states she is anticipating pt will return to the ALF at dc. Pt has a legal guardian, [name], from [place] [phone]).  MD anticipating dc later this week. TOC will follow.  Expected Discharge Plan: Assisted Living Barriers to Discharge: Continued Medical Work up   Patient Goals and CMS Choice        Expected Discharge Plan and Services Expected Discharge Plan: Assisted Living In-house Referral: Clinical Social Work     Living arrangements for the past 2 months: [place]                                      Prior Living Arrangements/Services Living arrangements for the past 2 months: [place] Lives with:: Facility Resident Patient language and need for interpreter reviewed:: Yes Do you feel safe going back to the place where you live?: Yes      Need for Family Participation in Patient Care: No (Comment) Care giver support system in place?: Yes (comment)   Criminal Activity/Legal Involvement Pertinent to Current Situation/Hospitalization: No - Comment as needed  Activities of Daily Living Home Assistive Devices/Equipment: Eyeglasses ADL Screening (condition at time of admission) Patient's cognitive ability adequate to safely complete daily activities?: Yes Is the patient deaf or have difficulty hearing?: Yes Does the patient have difficulty seeing, even when wearing glasses/contacts?: Yes Does the patient have difficulty concentrating, remembering, or making decisions?: Yes Patient able to express need for assistance with ADLs?:  Yes Does the patient have difficulty dressing or bathing?: Yes Independently performs ADLs?: No Communication: Independent Dressing (OT): Independent Grooming: Independent Feeding: Independent Bathing: Needs assistance Is this a change from baseline?: Pre-admission baseline Toileting: Independent In/Out Bed: Needs assistance Walks in Home: Independent with device (comment) Does the patient have difficulty walking or climbing stairs?: Yes Weakness of Legs: Both Weakness of Arms/Hands: Both  Permission Sought/Granted                  Emotional Assessment       Orientation: : Oriented to Self,Oriented to Place Alcohol / Substance Use: Not Applicable Psych Involvement: No (comment)  Admission diagnosis:  Hypokalemia [E87.6] SBO (small bowel obstruction) (HCC) [K56.609] Atrial fibrillation with RVR (HCC) [I48.91] Constipation, unspecified constipation type [K59.00] Patient Active Problem List   Diagnosis Date Noted  . SBO (small bowel obstruction) ([place]) 04/18/2020  . Chest pain 02/21/2020  . Hemorrhoids 11/26/2019  . Atrial fibrillation with RVR ([place]) 10/04/2019  . GERD (gastroesophageal reflux disease) 08/26/2019  . Loss of weight 08/26/2019  . Constipation 09/09/2018  . Hypotension 03/21/2018  . Diarrhea 07/20/2017  . Primary osteoarthritis of right hip 04/18/2017  . Orthostasis 03/28/2017  . General weakness 03/28/2017  . History of cerebrovascular accident (CVA) with residual deficit 03/28/2017  . Schizophrenia ([place]) 03/28/2017  . Generalized weakness 03/28/2017  . Orthostatic hypotension   . IBS (irritable bowel syndrome) 10/16/2016  . Left hemiparesis ([place]) 10/06/2016  . Left-sided weakness 10/06/2016  .  Chronic superficial gastritis without bleeding   . Stricture and stenosis of esophagus   . Nausea with vomiting 04/20/2016  . Abdominal pain, acute, bilateral lower quadrant 03/22/2016  . Lesion of right lobe of liver 03/22/2016  . Elevated troponin  02/13/2016  . IDA (iron deficiency anemia) 04/06/2015  . Mixed hyperlipidemia 11/06/2014  . Obesity due to excess calories 11/06/2014  . Sedentary lifestyle 11/06/2014  . Rectal bleeding 10/21/2014  . Encounter for therapeutic drug monitoring 03/24/2013  . Chronic anticoagulation 04/09/2010  . Type 2 diabetes mellitus with vascular disease ([place]) 03/24/2010  . Essential hypertension, benign 03/24/2010  . DVT, lower extremity, recurrent ([place]) 03/24/2010  . History of cardiovascular disorder 03/24/2010   PCP:  [name], MD Pharmacy:   [facility], [place] [facility] [address] [place] [address] Phone: [phone] Fax: [phone]     Social Determinants of Health (SDOH) Interventions    Readmission Risk Interventions No flowsheet data found.

## 2020-04-19 NOTE — Consult Note (Signed)
Reason for Consult: Small bowel obstruction Referring Physician: Dr. Lonia Bauer is an 75 y.o. female.  HPI: Patient is a 75 year old white female with multiple medical problems including schizophrenia, depression, left-sided weakness, diabetes mellitus, history of DVT on chronic anticoagulation, and history of irritable bowel disease/constipation who presented to the emergency room with a 4-day history of nausea and vomiting.  She was found on CT scan of the abdomen to have a partial small bowel obstruction and a significant stool burden in her colon.  Patient is somewhat of variable historian.  There does not appear to be any previous surgeries on her abdomen.  She was admitted to the ICU for control of her heart rate.  She was noted to be hypokalemic.  She denies any significant abdominal pain.  Past Medical History:  Diagnosis Date  . Anxiety   . Arthritis   . COVID-19 virus infection    COVID-19+ approx 01/24/19; asymptomatic course with full recovery  . Dependence on wheelchair    pivot/transfers  . Depression    History of psychosis and previous suicide attempt  . DVT, lower extremity, recurrent (HCC)    Long-term Coumadin per Dr. Legrand Rams  . Essential hypertension   . GERD (gastroesophageal reflux disease)   . Hemiplegia ([place]) 2010   Left side  . History of stroke    Acute infarct and right cerebral white matter small vessel disease 12/10  . Leg DVT (deep venous thromboembolism), acute ([place]) 2006  . Schizophrenia ([place])   . Stroke [facility])    left sided weakness  . Type 2 diabetes mellitus ([place])     Past Surgical History:  Procedure Laterality Date  . BACK SURGERY    . BIOPSY N/A 11/24/2014   Procedure: BIOPSY;  Surgeon: Danie Binder, MD;  Location: [facility];  Service: Endoscopy;  Laterality: N/A;  . BIOPSY  05/17/2016   Procedure: BIOPSY;  Surgeon: Danie Binder, MD;  Location: [facility] SU[facility];  Service: Endoscopy;;  gastric biopsy  . COLONOSCOPY WITH PROPOFOL N/A  11/24/2014   Dr. Rudie Meyer polyps removed/moderate sized internal hemorrhoids, tubular adenomas. Next surveillance in 3 years  . ESOPHAGOGASTRODUODENOSCOPY (EGD) WITH PROPOFOL N/A 11/24/2014   Dr. Clayburn Pert HH/patent stricture at the gastroesophageal junction, mild non-erosive gastritis, path negative for H.pylori or celiac sprue  . ESOPHAGOGASTRODUODENOSCOPY (EGD) WITH PROPOFOL N/A 05/17/2016   Procedure: ESOPHAGOGASTRODUODENOSCOPY (EGD) WITH PROPOFOL;  Surgeon: Danie Binder, MD;  Location: [facility];  Service: Endoscopy;  Laterality: N/A;  12:45pm  . GIVENS CAPSULE STUDY N/A 12/11/2014   MULTILPLE EROSION IN the stomach WITH ACTIVE OOZING. OCCASIONAL EROSIONS AND RARE ULCER SEEN IN PROXIMAL SMALL BOWEL . No masses or AVMs SEEN. NO OLD BLOOD OR FRESH BLOOD SEEN.   . POLYPECTOMY N/A 11/24/2014   Procedure: POLYPECTOMY;  Surgeon: Danie Binder, MD;  Location: [facility];  Service: Endoscopy;  Laterality: N/A;  . SAVORY DILATION N/A 05/17/2016   Procedure: SAVORY DILATION;  Surgeon: Danie Binder, MD;  Location: [facility];  Service: Endoscopy;  Laterality: N/A;    Family History  Problem Relation Age of Onset  . Hypertension Mother   . Colon cancer Neg Hx     Social History:  reports that she quit smoking about 25 years ago. Her smoking use included cigarettes. She has a 5.00 pack-year smoking history. She has never used smokeless tobacco. She reports that she does not drink alcohol and does not use drugs.  Allergies:  Allergies  Allergen Reactions  .  Sulfa Antibiotics Rash    Medications: I have reviewed the patient's current medications.  Results for orders placed or performed during the hospital encounter of 04/18/20 (from the past 48 hour(s))  CBC with Differential/Platelet     Status: Abnormal   Collection Time: 04/18/20 11:05 AM  Result Value Ref Range   WBC 9.5 4.0 - 10.5 K/uL   RBC 4.57 3.87 - 5.11 MIL/uL   Hemoglobin 13.8 12.0 - 15.0 g/dL   HCT 39.6 36.0 - 46.0 %    MCV 86.7 80.0 - 100.0 fL   MCH 30.2 26.0 - 34.0 pg   MCHC 34.8 30.0 - 36.0 g/dL   RDW 12.5 11.5 - 15.5 %   Platelets 250 150 - 400 K/uL   nRBC 0.0 0.0 - 0.2 %   Neutrophils Relative % 83 %   Neutro Abs 7.9 (H) 1.7 - 7.7 K/uL   Lymphocytes Relative 11 %   Lymphs Abs 1.1 0.7 - 4.0 K/uL   Monocytes Relative 6 %   Monocytes Absolute 0.6 0.1 - 1.0 K/uL   Eosinophils Relative 0 %   Eosinophils Absolute 0.0 0.0 - 0.5 K/uL   Basophils Relative 0 %   Basophils Absolute 0.0 0.0 - 0.1 K/uL   Immature Granulocytes 0 %   Abs Immature Granulocytes 0.02 0.00 - 0.07 K/uL    Comment: Performed at [facility], [address], [place], [place] [address]  Comprehensive metabolic panel     Status: Abnormal   Collection Time: 04/18/20 11:05 AM  Result Value Ref Range   Sodium 134 (L) 135 - 145 mmol/L   Potassium 3.0 (L) 3.5 - 5.1 mmol/L   Chloride 93 (L) 98 - 111 mmol/L   CO2 29 22 - 32 mmol/L   Glucose, Bld 97 70 - 99 mg/dL    Comment: Glucose reference range applies only to samples taken after fasting for at least 8 hours.   BUN 12 8 - 23 mg/dL   Creatinine, Ser 0.61 0.44 - 1.00 mg/dL   Calcium 8.5 (L) 8.9 - 10.3 mg/dL   Total Protein 6.7 6.5 - 8.1 g/dL   Albumin 3.4 (L) 3.5 - 5.0 g/dL   AST 18 15 - 41 U/L   ALT 13 0 - 44 U/L   Alkaline Phosphatase 53 38 - 126 U/L   Total Bilirubin 0.7 0.3 - 1.2 mg/dL   GFR, Estimated >60 >60 mL/min    Comment: (NOTE) Calculated using the CKD-EPI Creatinine Equation (2021)    Anion gap 12 5 - 15    Comment: Performed at [facility], [name] [place], [place] [address]  Lipase, blood     Status: None   Collection Time: 04/18/20 11:05 AM  Result Value Ref Range   Lipase 19 11 - 51 U/L    Comment: Performed at [facility], [address], [place], [place] [address]  TSH     Status: Abnormal   Collection Time: 04/18/20 11:05 AM  Result Value Ref Range   TSH 0.293 (L) 0.350 - 4.500 uIU/mL    Comment: Performed by a 3rd Generation assay with a  functional sensitivity of <=0.01 uIU/mL. Performed at [facility], [address] [place], [place] [address]   Resp Panel by RT-PCR (Flu A&B, Covid) Nasopharyngeal Swab     Status: None   Collection Time: 04/18/20  1:30 PM   Specimen: Nasopharyngeal Swab; Nasopharyngeal(NP) swabs in vial transport medium  Result Value Ref Range   SARS Coronavirus 2 by RT PCR  NEGATIVE NEGATIVE    Comment: (NOTE) SARS-CoV-2 target nucleic acids are NOT DETECTED.  The SARS-CoV-2 RNA is generally detectable in upper respiratory specimens during the acute phase of infection. The lowest concentration of SARS-CoV-2 viral copies this assay can detect is 138 copies/mL. A negative result does not preclude SARS-Cov-2 infection and should not be used as the sole basis for treatment or other patient management decisions. A negative result may occur with  improper specimen collection/handling, submission of specimen other than nasopharyngeal swab, presence of viral mutation(s) within the areas targeted by this assay, and inadequate number of viral copies(<138 copies/mL). A negative result must be combined with clinical observations, patient history, and epidemiological information. The expected result is Negative.  Fact Sheet for Patients:  EntrepreneurPulse.com.au  Fact Sheet for Healthcare Providers:  IncredibleEmployment.be  This test is no t yet approved or cleared by the [place] FDA and  has been authorized for detection and/or diagnosis of SARS-CoV-2 by FDA under an Emergency Use Authorization (EUA). This EUA will remain  in effect (meaning this test can be used) for the duration of the COVID-19 declaration under Section 564(b)(1) of the Act, 21 U.S.C.section 360bbb-3(b)(1), unless the authorization is terminated  or revoked sooner.       Influenza A by PCR NEGATIVE NEGATIVE   Influenza B by PCR NEGATIVE NEGATIVE    Comment: (NOTE) The Xpert Xpress  SARS-CoV-2/FLU/RSV plus assay is intended as an aid in the diagnosis of influenza from Nasopharyngeal swab specimens and should not be used as a sole basis for treatment. Nasal washings and aspirates are unacceptable for Xpert Xpress SARS-CoV-2/FLU/RSV testing.  Fact Sheet for Patients: EntrepreneurPulse.com.au  Fact Sheet for Healthcare Providers: IncredibleEmployment.be  This test is not yet approved or cleared by the [place] FDA and has been authorized for detection and/or diagnosis of SARS-CoV-2 by FDA under an Emergency Use Authorization (EUA). This EUA will remain in effect (meaning this test can be used) for the duration of the COVID-19 declaration under Section 564(b)(1) of the Act, 21 U.S.C. section 360bbb-3(b)(1), unless the authorization is terminated or revoked.  Performed at [facility], [address] [place], [place] [address]   Heparin level (unfractionated)     Status: Abnormal   Collection Time: 04/18/20  1:51 PM  Result Value Ref Range   Heparin Unfractionated >2.20 (H) 0.30 - 0.70 IU/mL    Comment: RESULTS CONFIRMED BY MANUAL DILUTION Performed at [facility], [address], [place], [place] [address]   APTT     Status: None   Collection Time: 04/18/20  1:51 PM  Result Value Ref Range   aPTT 33 24 - 36 seconds    Comment: Performed at [facility], [address], [place],  [address]  Glucose, capillary     Status: None   Collection Time: 04/18/20  3:50 PM  Result Value Ref Range   Glucose-Capillary 79 70 - 99 mg/dL    Comment: Glucose reference range applies only to samples taken after fasting for at least 8 hours.  Glucose, capillary     Status: None   Collection Time: 04/18/20  8:07 PM  Result Value Ref Range   Glucose-Capillary 97 70 - 99 mg/dL    Comment: Glucose reference range applies only to samples taken after fasting for at least 8 hours.  MRSA PCR Screening     Status: None   Collection Time:  04/18/20  8:47 PM   Specimen: Nasopharyngeal  Result Value Ref Range   MRSA by  PCR NEGATIVE NEGATIVE    Comment:        The GeneXpert MRSA Assay (FDA approved for NASAL specimens only), is one component of a comprehensive MRSA colonization surveillance program. It is not intended to diagnose MRSA infection nor to guide or monitor treatment for MRSA infections. Performed at [facility], [address] [place], [place] [address]   Urinalysis, Routine w reflex microscopic Urine, Clean Catch     Status: Abnormal   Collection Time: 04/18/20  9:57 PM  Result Value Ref Range   Color, Urine YELLOW YELLOW   APPearance CLEAR CLEAR   Specific Gravity, Urine >1.046 (H) 1.005 - 1.030   pH 5.0 5.0 - 8.0   Glucose, UA NEGATIVE NEGATIVE mg/dL   Hgb urine dipstick NEGATIVE NEGATIVE   Bilirubin Urine NEGATIVE NEGATIVE   Ketones, ur 80 (A) NEGATIVE mg/dL   Protein, ur NEGATIVE NEGATIVE mg/dL   Nitrite NEGATIVE NEGATIVE   Leukocytes,Ua NEGATIVE NEGATIVE    Comment: Performed at [facility], [address] [place], [place] [address]  APTT     Status: Abnormal   Collection Time: 04/18/20 10:10 PM  Result Value Ref Range   aPTT 80 (H) 24 - 36 seconds    Comment:        IF BASELINE aPTT IS ELEVATED, SUGGEST PATIENT RISK ASSESSMENT BE USED TO DETERMINE APPROPRIATE ANTICOAGULANT THERAPY. Performed at [facility], [address], [place], [place] [address]   Glucose, capillary     Status: None   Collection Time: 04/18/20 11:42 PM  Result Value Ref Range   Glucose-Capillary 98 70 - 99 mg/dL    Comment: Glucose reference range applies only to samples taken after fasting for at least 8 hours.   Comment 1 Notify RN    Comment 2 Document in Chart   Glucose, capillary     Status: Abnormal   Collection Time: 04/19/20  4:06 AM  Result Value Ref Range   Glucose-Capillary 133 (H) 70 - 99 mg/dL    Comment: Glucose reference range applies only to samples taken after fasting for at least 8 hours.    Comment 1 Notify RN    Comment 2 Document in Chart   Heparin level (unfractionated)     Status: Abnormal   Collection Time: 04/19/20  4:22 AM  Result Value Ref Range   Heparin Unfractionated 1.96 (H) 0.30 - 0.70 IU/mL    Comment: RESULTS CONFIRMED BY MANUAL DILUTION (NOTE) If heparin results are below expected values, and patient dosage has  been confirmed, suggest follow up testing of antithrombin III levels. Performed at [facility], [address], [place], [place] [address]   CBC     Status: Abnormal   Collection Time: 04/19/20  4:22 AM  Result Value Ref Range   WBC 6.9 4.0 - 10.5 K/uL   RBC 3.98 3.87 - 5.11 MIL/uL   Hemoglobin 12.1 12.0 - 15.0 g/dL   HCT 35.1 (L) 36.0 - 46.0 %   MCV 88.2 80.0 - 100.0 fL   MCH 30.4 26.0 - 34.0 pg   MCHC 34.5 30.0 - 36.0 g/dL   RDW 12.4 11.5 - 15.5 %   Platelets 219 150 - 400 K/uL   nRBC 0.0 0.0 - 0.2 %    Comment: Performed at [facility], [address], [place], [place] [address]  APTT     Status: Abnormal   Collection Time: 04/19/20  4:22 AM  Result Value Ref Range   aPTT 63 (H) 24 - 36 seconds  Comment:        IF BASELINE aPTT IS ELEVATED, SUGGEST PATIENT RISK ASSESSMENT BE USED TO DETERMINE APPROPRIATE ANTICOAGULANT THERAPY. Performed at [facility], [address] [place], [place] [address]   Magnesium     Status: None   Collection Time: 04/19/20  4:22 AM  Result Value Ref Range   Magnesium 1.8 1.7 - 2.4 mg/dL    Comment: Performed at [facility], [address] [place], [place] [address]  Comprehensive metabolic panel     Status: Abnormal   Collection Time: 04/19/20  4:22 AM  Result Value Ref Range   Sodium 137 135 - 145 mmol/L   Potassium 3.0 (L) 3.5 - 5.1 mmol/L   Chloride 101 98 - 111 mmol/L   CO2 20 (L) 22 - 32 mmol/L   Glucose, Bld 137 (H) 70 - 99 mg/dL    Comment: Glucose reference range applies only to samples taken after fasting for at least 8 hours.   BUN 14 8 - 23 mg/dL   Creatinine, Ser 0.60 0.44 - 1.00  mg/dL   Calcium 7.7 (L) 8.9 - 10.3 mg/dL   Total Protein 5.9 (L) 6.5 - 8.1 g/dL   Albumin 2.9 (L) 3.5 - 5.0 g/dL   AST 15 15 - 41 U/L   ALT 11 0 - 44 U/L   Alkaline Phosphatase 46 38 - 126 U/L   Total Bilirubin 0.9 0.3 - 1.2 mg/dL   GFR, Estimated >60 >60 mL/min    Comment: (NOTE) Calculated using the CKD-EPI Creatinine Equation (2021)    Anion gap 16 (H) 5 - 15    Comment: Performed at [facility], [address], [place], [place] [address]  Glucose, capillary     Status: Abnormal   Collection Time: 04/19/20  7:31 AM  Result Value Ref Range   Glucose-Capillary 160 (H) 70 - 99 mg/dL    Comment: Glucose reference range applies only to samples taken after fasting for at least 8 hours.   Comment 1 Notify RN     CT Abdomen Pelvis W Contrast  Result Date: 04/18/2020 CLINICAL DATA:  Nausea, vomiting, abdominal pain EXAM: CT ABDOMEN AND PELVIS WITH CONTRAST TECHNIQUE: Multidetector CT imaging of the abdomen and pelvis was performed using the standard protocol following bolus administration of intravenous contrast. CONTRAST:  150mL OMNIPAQUE IOHEXOL 300 MG/ML  SOLN COMPARISON:  03/28/2017 FINDINGS: Lower chest: No acute abnormality. Hepatobiliary: No solid liver abnormality is seen. Definitively benign subcapsular hemangioma of the right lobe of the liver, unchanged (series 2, image 21). No gallstones, gallbladder wall thickening, or biliary dilatation. Pancreas: Unremarkable. No pancreatic ductal dilatation or surrounding inflammatory changes. Spleen: Normal in size without significant abnormality. Adrenals/Urinary Tract: Adrenal glands are unremarkable. Kidneys are normal, without renal calculi, solid lesion, or hydronephrosis. Bladder is unremarkable. Stomach/Bowel: The stomach is fluid filled and the proximal small bowel is mildly distended, largest loops measuring up to 3.8 cm. There is an abrupt caliber change of the mid to distal small bowel in the midline ventral abdomen (series 2, image 53,  series 5, image 43), with relatively decompressed, although still gas and fluid containing distal small bowel. Appendix appears normal. Large burden of stool and stool balls throughout the colon with numerous large stool balls in the rectum. Vascular/Lymphatic: Aortic atherosclerosis. No enlarged abdominal or pelvic lymph nodes. Reproductive: No mass or other significant abnormality. Other: No abdominal wall hernia or abnormality. Small volume ascites throughout the abdomen. Musculoskeletal: No acute or significant osseous findings. IMPRESSION: 1. The  stomach is fluid filled and the proximal small bowel is mildly distended. There is an abrupt caliber change of the mid to distal small bowel in the midline ventral abdomen, with relatively decompressed, although still gas and fluid containing distal small bowel. Findings are concerning for partial or developing small bowel obstruction. 2. Large burden of stool and stool balls throughout the colon with numerous large stool balls in the rectum. Correlate for fecal impaction. 3. Small volume ascites. Electronically Signed   By: Eddie Candle M.D.   On: 04/18/2020 12:23    ROS:  Review of systems not obtained due to patient factors.  Blood pressure (!) 150/47, pulse 88, temperature 98.6 F (37 C), resp. rate (!) 27, height 5\' 5"  (1.651 m), weight 57.6 kg, SpO2 94 %. Physical Exam: Pleasant white female no acute distress Head is normocephalic, atraumatic Lungs clear to auscultation with equal breath sounds bilaterally Heart examination reveals regular rate and rhythm at the present time. Abdomen is soft but distended.  No rigidity is noted.  No tenderness is noted.  No hernias are noted.  CT scan images personally reviewed  Assessment/Plan: Impression: Partial small bowel obstruction of unknown etiology.  Difficult to certain whether this is mechanical or medical in nature.  She is hypokalemic.  In addition, she has a significant stool burden in the colon.   No need for acute surgical invention at this time. Plan: We will give multiple enemas to see whether we can clear the stool burden.  It was decided not to place an NG tube as she would probably pull this out.  Would correct her hypokalemia.  We will reassess tomorrow in a.m.  May need small bowel Gastrografin study.  [name] 04/19/2020, 11:17 AM

## 2020-04-19 NOTE — Progress Notes (Signed)
ANTICOAGULATION CONSULT NOTE -   Pharmacy Consult for heparin Indication: atrial fibrillation  Allergies  Allergen Reactions  . Sulfa Antibiotics Rash    Patient Measurements: Height: 5\' 5"  (165.1 cm) Weight: 57.6 kg (127 lb) IBW/kg (Calculated) : 57 Heparin Dosing Weight: 57.6 kg  Vital Signs: Temp: 98 F (36.7 C) (03/28 1100) Temp Source: Axillary (03/28 1100) BP: 156/60 (03/28 1200) Pulse Rate: 95 (03/28 1200)  Labs: Recent Labs    04/18/20 1105 04/18/20 1351 04/18/20 1351 04/18/20 2210 04/19/20 0422 04/19/20 1539  HGB 13.8  --   --   --  12.1  --   HCT 39.6  --   --   --  35.1*  --   PLT 250  --   --   --  219  --   APTT  --  33   < > 80* 63* 71*  HEPARINUNFRC  --  >2.20*  --   --  1.96*  --   CREATININE 0.61  --   --   --  0.60  --    < > = values in this interval not displayed.    Estimated Creatinine Clearance: 55.5 mL/min (by C-G formula based on SCr of 0.6 mg/dL).   Medical History: Past Medical History:  Diagnosis Date  . Anxiety   . Arthritis   . COVID-19 virus infection    COVID-19+ approx 01/24/19; asymptomatic course with full recovery  . Dependence on wheelchair    pivot/transfers  . Depression    History of psychosis and previous suicide attempt  . DVT, lower extremity, recurrent (HCC)    Long-term Coumadin per Dr. Legrand Rams  . Essential hypertension   . GERD (gastroesophageal reflux disease)   . Hemiplegia ([place]) 2010   Left side  . History of stroke    Acute infarct and right cerebral white matter small vessel disease 12/10  . Leg DVT (deep venous thromboembolism), acute ([place]) 2006  . Schizophrenia ([place])   . Stroke [facility])    left sided weakness  . Type 2 diabetes mellitus (HCC)     Medications:  Medications Prior to Admission  Medication Sig Dispense Refill Last Dose  . [START ON 04/26/2020] ABILIFY 2 MG tablet Take 1 tablet (2 mg total) by mouth at bedtime. 30 tablet 4 04/17/2020 at Unknown time  . acetaminophen (TYLENOL) 325 MG tablet  Take 650 mg by mouth 3 (three) times daily as needed for mild pain or moderate pain.      Marland Kitchen apixaban (ELIQUIS) 5 MG TABS tablet Take 1 tablet (5 mg total) by mouth 2 (two) times daily. 60 tablet 0 04/17/2020 at 2000  . atorvastatin (LIPITOR) 40 MG tablet Take 1 tablet (40 mg total) by mouth at bedtime. 60 tablet 0 04/17/2020 at Unknown time  . calcium carbonate (TUMS - DOSED IN MG ELEMENTAL CALCIUM) 500 MG chewable tablet Chew 1 tablet by mouth 4 (four) times daily as needed for heartburn.     . Carboxymethylcellulose Sodium (ARTIFICIAL TEARS OP) Place 1 drop into both eyes 3 (three) times daily.   04/17/2020 at Unknown time  . Cholecalciferol (VITAMIN D3) 50 MCG (2000 UT) TABS Take 2,000 Units by mouth daily.   04/17/2020 at Unknown time  . clonazePAM (KLONOPIN) 0.5 MG tablet Take 1 tablet (0.5 mg total) by mouth 3 (three) times daily as needed for anxiety. 90 tablet 2 04/17/2020 at Unknown time  . diltiazem (CARDIZEM CD) 120 MG 24 hr capsule Take 1 capsule (120 mg total) by mouth  daily. 90 capsule 1 04/17/2020 at Unknown time  . DULoxetine (CYMBALTA) 60 MG capsule Take 60 mg by mouth daily.   04/17/2020 at Unknown time  . FERREX 150 150 MG capsule Take 150 mg by mouth daily.   04/17/2020 at Unknown time  . gabapentin (NEURONTIN) 300 MG capsule Take 1 capsule (300 mg total) by mouth 3 (three) times daily as needed.   04/17/2020 at Unknown time  . HYDROcodone-acetaminophen (NORCO) 10-325 MG tablet Take 1 tablet by mouth 3 (three) times daily as needed. 30 tablet  04/17/2020 at Unknown time  . hydrocortisone (ANUSOL-HC) 2.5 % rectal cream Place 1 application rectally 2 (two) times daily as needed (for rectal bleeding).     . hydrOXYzine (ATARAX/VISTARIL) 25 MG tablet Take 25 mg by mouth 3 (three) times daily.   04/17/2020 at Unknown time  . insulin detemir (LEVEMIR FLEXTOUCH) 100 UNIT/ML FlexPen Inject 38 Units into the skin at bedtime. (Patient taking differently: Inject 42 Units into the skin at bedtime.) 15 mL  3 04/17/2020 at Unknown time  . latanoprost (XALATAN) 0.005 % ophthalmic solution Place 1 drop into both eyes at bedtime.   04/17/2020 at Unknown time  . loratadine (CLARITIN) 10 MG tablet Take 10 mg by mouth daily.   04/17/2020 at Unknown time  . lubiprostone (AMITIZA) 24 MCG capsule Take 1 capsule (24 mcg total) by mouth 2 (two) times daily with a meal. 60 capsule 5 04/17/2020 at Unknown time  . metoprolol succinate (TOPROL-XL) 25 MG 24 hr tablet Take 1 tablet (25 mg total) by mouth daily. 90 tablet 3 04/17/2020 at 0800  . midodrine (PROAMATINE) 10 MG tablet TAKE (1) TABLET BY MOUTH (3) TIMES DAILY. (Patient taking differently: Take 10 mg by mouth 3 (three) times daily.) 90 tablet 3 04/17/2020 at Unknown time  . mirabegron ER (MYRBETRIQ) 25 MG TB24 tablet Take 25 mg by mouth daily.   04/17/2020 at Unknown time  . mirtazapine (REMERON) 30 MG tablet Take 30 mg by mouth at bedtime.   04/17/2020 at Unknown time  . ondansetron (ZOFRAN) 4 MG tablet Take 1 tablet (4 mg total) by mouth 4 (four) times daily -  before meals and at bedtime. (Patient taking differently: Take 4 mg by mouth 2 (two) times daily as needed for nausea or vomiting.) 120 tablet 3 04/17/2020 at Unknown time  . pantoprazole (PROTONIX) 40 MG tablet Take 1 tablet (40 mg total) by mouth daily. 270 tablet 3 04/17/2020 at Unknown time  . polyethylene glycol (MIRALAX / GLYCOLAX) 17 g packet Take 17 g by mouth daily. 14 each 0 04/17/2020 at Unknown time  . [START ON 04/26/2020] risperiDONE (RISPERDAL) 1 MG tablet Take 1 tablet (1 mg total) by mouth in the morning and at bedtime. 60 tablet 4 04/17/2020 at Unknown time  . senna (SENOKOT) 8.6 MG TABS tablet Take 1 tablet (8.6 mg total) by mouth daily. 120 tablet 0 04/17/2020 at Unknown time  . simethicone (MYLICON) 80 MG chewable tablet Chew 80 mg by mouth 3 (three) times daily.   04/17/2020 at Unknown time  . tiZANidine (ZANAFLEX) 2 MG tablet Take 1 tablet (2 mg total) by mouth daily as needed for muscle spasms.  30 tablet    . TRADJENTA 5 MG TABS tablet TAKE (1) TABLET BY MOUTH ONCE DAILY. (Patient taking differently: Take 5 mg by mouth daily.) 30 tablet 0 04/17/2020 at Unknown time  . zolpidem (AMBIEN) 10 MG tablet Take 10 mg by mouth at bedtime as needed for sleep.  04/17/2020 at Unknown time  . simvastatin (ZOCOR) 20 MG tablet Take 20 mg by mouth daily.       Assessment: Pharmacy consulted to dose heparin in patient with atrial fibrillation.  Patient is on apixaban prior to admission with last dose given 3/26 @ 2000.  Will dose based on aPTT until correlation with heparin levels.  CBC WNL APTT 71- therapeutic  Goal of Therapy:  Heparin level 0.3-0.7 units/ml aPTT 66-102 seconds Monitor platelets by anticoagulation protocol: Yes   Plan:  Continue heparin infusion at 950 units/hr Check APTT and heparin level daily while on heparin Continue to monitor H&H and platelets.  Isac Sarna, BS [place], [place] Clinical Pharmacist Pager [phone] 04/19/2020 4:14 PM

## 2020-04-19 NOTE — Progress Notes (Signed)
[place] for heparin Indication: atrial fibrillation  Allergies  Allergen Reactions  . Sulfa Antibiotics Rash    Patient Measurements: Height: 5\' 5"  (165.1 cm) Weight: 57.6 kg (127 lb) IBW/kg (Calculated) : 57 Heparin Dosing Weight: 57.6 kg  Vital Signs: Temp: 97.6 F (36.4 C) (03/27 2000) Temp Source: Oral (03/27 2000) BP: 171/64 (03/28 0001) Pulse Rate: 81 (03/28 0001)  Labs: Recent Labs    04/18/20 1105 04/18/20 1351 04/18/20 2210  HGB 13.8  --   --   HCT 39.6  --   --   PLT 250  --   --   APTT  --  33 80*  HEPARINUNFRC  --  >2.20*  --   CREATININE 0.61  --   --     Estimated Creatinine Clearance: 55.5 mL/min (by C-G formula based on SCr of 0.61 mg/dL).   Medical History: Past Medical History:  Diagnosis Date  . Anxiety   . Arthritis   . COVID-19 virus infection    COVID-19+ approx 01/24/19; asymptomatic course with full recovery  . Dependence on wheelchair    pivot/transfers  . Depression    History of psychosis and previous suicide attempt  . DVT, lower extremity, recurrent (HCC)    Long-term Coumadin per Dr. Legrand Rams  . Essential hypertension   . GERD (gastroesophageal reflux disease)   . Hemiplegia ([place]) 2010   Left side  . History of stroke    Acute infarct and right cerebral white matter small vessel disease 12/10  . Leg DVT (deep venous thromboembolism), acute ([place]) 2006  . Schizophrenia ([place])   . Stroke [facility])    left sided weakness  . Type 2 diabetes mellitus (HCC)     Medications:  Medications Prior to Admission  Medication Sig Dispense Refill Last Dose  . [START ON 04/26/2020] ABILIFY 2 MG tablet Take 1 tablet (2 mg total) by mouth at bedtime. 30 tablet 4 04/17/2020 at Unknown time  . acetaminophen (TYLENOL) 325 MG tablet Take 650 mg by mouth 3 (three) times daily as needed for mild pain or moderate pain.      Marland Kitchen apixaban (ELIQUIS) 5 MG TABS tablet Take 1 tablet (5 mg total) by mouth 2 (two) times daily.  60 tablet 0 04/17/2020 at 2000  . atorvastatin (LIPITOR) 40 MG tablet Take 1 tablet (40 mg total) by mouth at bedtime. 60 tablet 0 04/17/2020 at Unknown time  . calcium carbonate (TUMS - DOSED IN MG ELEMENTAL CALCIUM) 500 MG chewable tablet Chew 1 tablet by mouth 4 (four) times daily as needed for heartburn.     . Carboxymethylcellulose Sodium (ARTIFICIAL TEARS OP) Place 1 drop into both eyes 3 (three) times daily.   04/17/2020 at Unknown time  . Cholecalciferol (VITAMIN D3) 50 MCG (2000 UT) TABS Take 2,000 Units by mouth daily.   04/17/2020 at Unknown time  . clonazePAM (KLONOPIN) 0.5 MG tablet Take 1 tablet (0.5 mg total) by mouth 3 (three) times daily as needed for anxiety. 90 tablet 2 04/17/2020 at Unknown time  . diltiazem (CARDIZEM CD) 120 MG 24 hr capsule Take 1 capsule (120 mg total) by mouth daily. 90 capsule 1 04/17/2020 at Unknown time  . DULoxetine (CYMBALTA) 60 MG capsule Take 60 mg by mouth daily.   04/17/2020 at Unknown time  . FERREX 150 150 MG capsule Take 150 mg by mouth daily.   04/17/2020 at Unknown time  . gabapentin (NEURONTIN) 300 MG capsule Take 1 capsule (300 mg total) by  mouth 3 (three) times daily as needed.   04/17/2020 at Unknown time  . HYDROcodone-acetaminophen (NORCO) 10-325 MG tablet Take 1 tablet by mouth 3 (three) times daily as needed. 30 tablet  04/17/2020 at Unknown time  . hydrocortisone (ANUSOL-HC) 2.5 % rectal cream Place 1 application rectally 2 (two) times daily as needed (for rectal bleeding).     . hydrOXYzine (ATARAX/VISTARIL) 25 MG tablet Take 25 mg by mouth 3 (three) times daily.   04/17/2020 at Unknown time  . insulin detemir (LEVEMIR FLEXTOUCH) 100 UNIT/ML FlexPen Inject 38 Units into the skin at bedtime. (Patient taking differently: Inject 42 Units into the skin at bedtime.) 15 mL 3 04/17/2020 at Unknown time  . latanoprost (XALATAN) 0.005 % ophthalmic solution Place 1 drop into both eyes at bedtime.   04/17/2020 at Unknown time  . loratadine (CLARITIN) 10 MG  tablet Take 10 mg by mouth daily.   04/17/2020 at Unknown time  . lubiprostone (AMITIZA) 24 MCG capsule Take 1 capsule (24 mcg total) by mouth 2 (two) times daily with a meal. 60 capsule 5 04/17/2020 at Unknown time  . metoprolol succinate (TOPROL-XL) 25 MG 24 hr tablet Take 1 tablet (25 mg total) by mouth daily. 90 tablet 3 04/17/2020 at 0800  . midodrine (PROAMATINE) 10 MG tablet TAKE (1) TABLET BY MOUTH (3) TIMES DAILY. (Patient taking differently: Take 10 mg by mouth 3 (three) times daily.) 90 tablet 3 04/17/2020 at Unknown time  . mirabegron ER (MYRBETRIQ) 25 MG TB24 tablet Take 25 mg by mouth daily.   04/17/2020 at Unknown time  . mirtazapine (REMERON) 30 MG tablet Take 30 mg by mouth at bedtime.   04/17/2020 at Unknown time  . ondansetron (ZOFRAN) 4 MG tablet Take 1 tablet (4 mg total) by mouth 4 (four) times daily -  before meals and at bedtime. (Patient taking differently: Take 4 mg by mouth 2 (two) times daily as needed for nausea or vomiting.) 120 tablet 3 04/17/2020 at Unknown time  . pantoprazole (PROTONIX) 40 MG tablet Take 1 tablet (40 mg total) by mouth daily. 270 tablet 3 04/17/2020 at Unknown time  . polyethylene glycol (MIRALAX / GLYCOLAX) 17 g packet Take 17 g by mouth daily. 14 each 0 04/17/2020 at Unknown time  . [START ON 04/26/2020] risperiDONE (RISPERDAL) 1 MG tablet Take 1 tablet (1 mg total) by mouth in the morning and at bedtime. 60 tablet 4 04/17/2020 at Unknown time  . senna (SENOKOT) 8.6 MG TABS tablet Take 1 tablet (8.6 mg total) by mouth daily. 120 tablet 0 04/17/2020 at Unknown time  . simethicone (MYLICON) 80 MG chewable tablet Chew 80 mg by mouth 3 (three) times daily.   04/17/2020 at Unknown time  . tiZANidine (ZANAFLEX) 2 MG tablet Take 1 tablet (2 mg total) by mouth daily as needed for muscle spasms. 30 tablet    . TRADJENTA 5 MG TABS tablet TAKE (1) TABLET BY MOUTH ONCE DAILY. (Patient taking differently: Take 5 mg by mouth daily.) 30 tablet 0 04/17/2020 at Unknown time  .  zolpidem (AMBIEN) 10 MG tablet Take 10 mg by mouth at bedtime as needed for sleep.   04/17/2020 at Unknown time  . simvastatin (ZOCOR) 20 MG tablet Take 20 mg by mouth daily.       Assessment: Pharmacy consulted to dose heparin in patient with atrial fibrillation.  Patient is on apixaban prior to admission with last dose given 3/26 @ 2000.  Will dose based on aPTT until correlation with heparin  levels.  CBC WNL  3/28 AM update:  Initial aPTT is therapeutic   Goal of Therapy:  Heparin level 0.3-0.7 units/ml aPTT 66-102 seconds Monitor platelets by anticoagulation protocol: Yes   Plan:  Cont heparin at 850 units/hr Confirmatory aPTT with AM labs  Narda Bonds, PharmD, [place] Pharmacist Phone: [phone]

## 2020-04-19 NOTE — Progress Notes (Signed)
PROGRESS NOTE    [name]  KWI:[id number] DOB: 10-11-1945 DOA: 04/18/2020 PCP: [name], MD   Brief Narrative:   [name] is a 75 y.o. female with medical history significant for schizophrenia, dyslipidemia, chronic pain, GERD, type 2 diabetes, atrial fibrillation on Eliquis, and chronic iron deficiency anemia who was brought to the ED from her skilled facility due to worsening abdominal pain and mild distention.  Patient apparently had nausea and vomiting as well over the last 4 days and it is uncertain whether or not she has been able to keep down her medications.  Patient was admitted with partial SBO as well as atrial fibrillation with RVR.  Assessment & Plan:   Active Problems:   SBO (small bowel obstruction) (HCC)   SBO  -It is felt that NG to would not be safe for the patient at this time therefore will be avoided -N.p.o. except ice chips, advance diet per general surgery -IV fluid -Focus on goal potassium of 4 and magnesium of 2 -Appreciate general surgery evaluation and recommendations -Zofran as needed for nausea and vomiting  Atrial fibrillation with RVR-currently in sinus rhythm -Likely due to acute illness along with missed doses of Cardizem -Diltiazem drip initiated in the ED -Hold Eliquis and keep on heparin drip -Monitor in stepdown unit -Transition to oral agents once diet is advanced  Hypokalemia -Replete IV -Recheck in a.m. along with magnesium  Type 2 diabetes -Prior hemoglobin A1c 9.2% on 01/2020 -Blood glucose currently stable -Maintain on SSI -Avoid oral agents as well as long-acting  GERD -PPI IV daily  History of schizophrenia -We will need to hold home medications -Haldol as needed for agitation  Chronic pain -Plan to hold oral pain medications -IV medications as needed  Iron deficiency anemia-stable -Currently appears stable with no other bleeding -Follow CBC to monitor while on heparin drip -Hold home iron  supplementation -Stool occult negative for bleeding per EDP  Low TSH -Check free T4 levels   DVT prophylaxis:Heparin drip Code Status: Full Family Communication: None at bedside Disposition Plan:  Status is: Inpatient  Remains inpatient appropriate because:Persistent severe electrolyte disturbances, IV treatments appropriate due to intensity of illness or inability to take PO and Inpatient level of care appropriate due to severity of illness   Dispo: The patient is from: SNF              Anticipated d/c is to: SNF              Patient currently is not medically stable to d/c.   Difficult to place patient No  Consultants:   General surgery  Procedures:   See below  Antimicrobials:   None   Subjective: Patient seen and evaluated today with improvements in nausea and vomiting noted.  Apparently no vomiting overnight.  She denies significant abdominal pain and is passing flatus.  No bowel movement.  Heart rates have stabilized and she is currently in sinus rhythm.  Objective: Vitals:   04/19/20 0700 04/19/20 0800 04/19/20 0900 04/19/20 1000  BP: (!) 153/60 (!) 147/45 (!) 155/61 (!) 150/47  Pulse: 89 91 90 88  Resp: (!) 21 (!) 22 (!) 27 (!) 27  Temp: 98.6 F (37 C)     TempSrc:      SpO2: 96% 97% 97% 94%  Weight:      Height:        Intake/Output Summary (Last 24 hours) at 04/19/2020 1105 Last data filed at 04/19/2020 0853 Gross per 24 hour  Intake 2200.26 ml  Output 575 ml  Net 1625.26 ml   Filed Weights   04/18/20 1029  Weight: 57.6 kg    Examination:  General exam: Appears calm and comfortable  Respiratory system: Clear to auscultation. Respiratory effort normal. Cardiovascular system: S1 & S2 heard, RRR.  Gastrointestinal system: Abdomen is soft, minimally distended Central nervous system: Alert and awake Extremities: No edema Skin: No significant lesions noted Psychiatry: Flat affect.    Data Reviewed: I have personally reviewed following  labs and imaging studies  CBC: Recent Labs  Lab 04/18/20 1105 04/19/20 0422  WBC 9.5 6.9  NEUTROABS 7.9*  --   HGB 13.8 12.1  HCT 39.6 35.1*  MCV 86.7 88.2  PLT 250 287   Basic Metabolic Panel: Recent Labs  Lab 04/18/20 1105 04/19/20 0422  NA 134* 137  K 3.0* 3.0*  CL 93* 101  CO2 29 20*  GLUCOSE 97 137*  BUN 12 14  CREATININE 0.61 0.60  CALCIUM 8.5* 7.7*  MG  --  1.8   GFR: Estimated Creatinine Clearance: 55.5 mL/min (by C-G formula based on SCr of 0.6 mg/dL). Liver Function Tests: Recent Labs  Lab 04/18/20 1105 04/19/20 0422  AST 18 15  ALT 13 11  ALKPHOS 53 46  BILITOT 0.7 0.9  PROT 6.7 5.9*  ALBUMIN 3.4* 2.9*   Recent Labs  Lab 04/18/20 1105  LIPASE 19   No results for input(s): AMMONIA in the last 168 hours. Coagulation Profile: No results for input(s): INR, PROTIME in the last 168 hours. Cardiac Enzymes: No results for input(s): CKTOTAL, CKMB, CKMBINDEX, TROPONINI in the last 168 hours. BNP (last 3 results) No results for input(s): PROBNP in the last 8760 hours. HbA1C: No results for input(s): HGBA1C in the last 72 hours. CBG: Recent Labs  Lab 04/18/20 1550 04/18/20 2007 04/18/20 2342 04/19/20 0406 04/19/20 0731  GLUCAP 79 97 98 133* 160*   Lipid Profile: No results for input(s): CHOL, HDL, LDLCALC, TRIG, CHOLHDL, LDLDIRECT in the last 72 hours. Thyroid Function Tests: Recent Labs    04/18/20 1105  TSH 0.293*   Anemia Panel: No results for input(s): VITAMINB12, FOLATE, FERRITIN, TIBC, IRON, RETICCTPCT in the last 72 hours. Sepsis Labs: No results for input(s): PROCALCITON, LATICACIDVEN in the last 168 hours.  Recent Results (from the past 240 hour(s))  Resp Panel by RT-PCR (Flu A&B, Covid) Nasopharyngeal Swab     Status: None   Collection Time: 04/18/20  1:30 PM   Specimen: Nasopharyngeal Swab; Nasopharyngeal(NP) swabs in vial transport medium  Result Value Ref Range Status   SARS Coronavirus 2 by RT PCR NEGATIVE NEGATIVE  Final    Comment: (NOTE) SARS-CoV-2 target nucleic acids are NOT DETECTED.  The SARS-CoV-2 RNA is generally detectable in upper respiratory specimens during the acute phase of infection. The lowest concentration of SARS-CoV-2 viral copies this assay can detect is 138 copies/mL. A negative result does not preclude SARS-Cov-2 infection and should not be used as the sole basis for treatment or other patient management decisions. A negative result may occur with  improper specimen collection/handling, submission of specimen other than nasopharyngeal swab, presence of viral mutation(s) within the areas targeted by this assay, and inadequate number of viral copies(<138 copies/mL). A negative result must be combined with clinical observations, patient history, and epidemiological information. The expected result is Negative.  Fact Sheet for Patients:  EntrepreneurPulse.com.au  Fact Sheet for Healthcare Providers:  IncredibleEmployment.be  This test is no t yet approved or cleared by the  [place] [employer] and  has been authorized for detection and/or diagnosis of SARS-CoV-2 by FDA under an Print production planner (EUA). This EUA will remain  in effect (meaning this test can be used) for the duration of the COVID-19 declaration under Section 564(b)(1) of the Act, 21 U.S.C.section 360bbb-3(b)(1), unless the authorization is terminated  or revoked sooner.       Influenza A by PCR NEGATIVE NEGATIVE Final   Influenza B by PCR NEGATIVE NEGATIVE Final    Comment: (NOTE) The Xpert Xpress SARS-CoV-2/FLU/RSV plus assay is intended as an aid in the diagnosis of influenza from Nasopharyngeal swab specimens and should not be used as a sole basis for treatment. Nasal washings and aspirates are unacceptable for Xpert Xpress SARS-CoV-2/FLU/RSV testing.  Fact Sheet for Patients: EntrepreneurPulse.com.au  Fact Sheet for Healthcare  Providers: IncredibleEmployment.be  This test is not yet approved or cleared by the [place] FDA and has been authorized for detection and/or diagnosis of SARS-CoV-2 by FDA under an Emergency Use Authorization (EUA). This EUA will remain in effect (meaning this test can be used) for the duration of the COVID-19 declaration under Section 564(b)(1) of the Act, 21 U.S.C. section 360bbb-3(b)(1), unless the authorization is terminated or revoked.  Performed at [facility], [address] [place], [place] [address]   MRSA PCR Screening     Status: None   Collection Time: 04/18/20  8:47 PM   Specimen: Nasopharyngeal  Result Value Ref Range Status   MRSA by PCR NEGATIVE NEGATIVE Final    Comment:        The GeneXpert MRSA Assay (FDA approved for NASAL specimens only), is one component of a comprehensive MRSA colonization surveillance program. It is not intended to diagnose MRSA infection nor to guide or monitor treatment for MRSA infections. Performed at [facility], [address], [place], [place] [address]          Radiology Studies: CT Abdomen Pelvis W Contrast  Result Date: 04/18/2020 CLINICAL DATA:  Nausea, vomiting, abdominal pain EXAM: CT ABDOMEN AND PELVIS WITH CONTRAST TECHNIQUE: Multidetector CT imaging of the abdomen and pelvis was performed using the standard protocol following bolus administration of intravenous contrast. CONTRAST:  178mL OMNIPAQUE IOHEXOL 300 MG/ML  SOLN COMPARISON:  03/28/2017 FINDINGS: Lower chest: No acute abnormality. Hepatobiliary: No solid liver abnormality is seen. Definitively benign subcapsular hemangioma of the right lobe of the liver, unchanged (series 2, image 21). No gallstones, gallbladder wall thickening, or biliary dilatation. Pancreas: Unremarkable. No pancreatic ductal dilatation or surrounding inflammatory changes. Spleen: Normal in size without significant abnormality. Adrenals/Urinary Tract: Adrenal glands are  unremarkable. Kidneys are normal, without renal calculi, solid lesion, or hydronephrosis. Bladder is unremarkable. Stomach/Bowel: The stomach is fluid filled and the proximal small bowel is mildly distended, largest loops measuring up to 3.8 cm. There is an abrupt caliber change of the mid to distal small bowel in the midline ventral abdomen (series 2, image 53, series 5, image 43), with relatively decompressed, although still gas and fluid containing distal small bowel. Appendix appears normal. Large burden of stool and stool balls throughout the colon with numerous large stool balls in the rectum. Vascular/Lymphatic: Aortic atherosclerosis. No enlarged abdominal or pelvic lymph nodes. Reproductive: No mass or other significant abnormality. Other: No abdominal wall hernia or abnormality. Small volume ascites throughout the abdomen. Musculoskeletal: No acute or significant osseous findings. IMPRESSION: 1. The stomach is fluid filled and the proximal small bowel is mildly distended. There is an abrupt caliber change of the  mid to distal small bowel in the midline ventral abdomen, with relatively decompressed, although still gas and fluid containing distal small bowel. Findings are concerning for partial or developing small bowel obstruction. 2. Large burden of stool and stool balls throughout the colon with numerous large stool balls in the rectum. Correlate for fecal impaction. 3. Small volume ascites. Electronically Signed   By: Eddie Candle M.D.   On: 04/18/2020 12:23        Scheduled Meds: . Chlorhexidine Gluconate Cloth  6 each Topical Daily  . insulin aspart  0-9 Units Subcutaneous Q4H  . latanoprost  1 drop Both Eyes QHS  . pantoprazole (PROTONIX) IV  40 mg Intravenous Q24H   Continuous Infusions: . sodium chloride Stopped (04/19/20 0851)  . diltiazem (CARDIZEM) infusion 5 mg/hr (04/19/20 0853)  . heparin 950 Units/hr (04/19/20 0819)  . potassium chloride 10 mEq (04/19/20 0738)     LOS: 1  day    Time spent: 35 minutes    Carless Slatten D Manuella Ghazi, DO [facility]  If 7PM-7AM, please contact night-coverage [email or web address] 04/19/2020, 11:05 AM

## 2020-04-20 DIAGNOSIS — K56609 Unspecified intestinal obstruction, unspecified as to partial versus complete obstruction: Secondary | ICD-10-CM | POA: Diagnosis not present

## 2020-04-20 LAB — CBC
HCT: 34.5 % — ABNORMAL LOW (ref 36.0–46.0)
Hemoglobin: 11.5 g/dL — ABNORMAL LOW (ref 12.0–15.0)
MCH: 29.6 pg (ref 26.0–34.0)
MCHC: 33.3 g/dL (ref 30.0–36.0)
MCV: 88.9 fL (ref 80.0–100.0)
Platelets: 201 10*3/uL (ref 150–400)
RBC: 3.88 MIL/uL (ref 3.87–5.11)
RDW: 12.6 % (ref 11.5–15.5)
WBC: 7.9 10*3/uL (ref 4.0–10.5)
nRBC: 0 % (ref 0.0–0.2)

## 2020-04-20 LAB — GLUCOSE, CAPILLARY
Glucose-Capillary: 155 mg/dL — ABNORMAL HIGH (ref 70–99)
Glucose-Capillary: 151 mg/dL — ABNORMAL HIGH (ref 70–99)
Glucose-Capillary: 162 mg/dL — ABNORMAL HIGH (ref 70–99)
Glucose-Capillary: 141 mg/dL — ABNORMAL HIGH (ref 70–99)
Glucose-Capillary: 208 mg/dL — ABNORMAL HIGH (ref 70–99)
Glucose-Capillary: 158 mg/dL — ABNORMAL HIGH (ref 70–99)
Glucose-Capillary: 84 mg/dL (ref 70–99)

## 2020-04-20 LAB — COMPREHENSIVE METABOLIC PANEL
ALT: 13 U/L (ref 0–44)
AST: 17 U/L (ref 15–41)
Albumin: 2.9 g/dL — ABNORMAL LOW (ref 3.5–5.0)
Alkaline Phosphatase: 47 U/L (ref 38–126)
Anion gap: 10 (ref 5–15)
BUN: 11 mg/dL (ref 8–23)
CO2: 20 mmol/L — ABNORMAL LOW (ref 22–32)
Calcium: 7.6 mg/dL — ABNORMAL LOW (ref 8.9–10.3)
Chloride: 105 mmol/L (ref 98–111)
Creatinine, Ser: 0.58 mg/dL (ref 0.44–1.00)
GFR, Estimated: >60 mL/min (ref >60)
Glucose, Bld: 169 mg/dL — ABNORMAL HIGH (ref 70–99)
Potassium: 3.5 mmol/L (ref 3.5–5.1)
Sodium: 135 mmol/L (ref 135–145)
Total Bilirubin: 0.8 mg/dL (ref 0.3–1.2)
Total Protein: 5.8 g/dL — ABNORMAL LOW (ref 6.5–8.1)

## 2020-04-20 LAB — APTT: aPTT: 78 seconds — ABNORMAL HIGH (ref 24–36)

## 2020-04-20 LAB — HEPARIN LEVEL (UNFRACTIONATED): Heparin Unfractionated: 1.01 IU/mL — ABNORMAL HIGH (ref 0.30–0.70)

## 2020-04-20 LAB — MAGNESIUM: Magnesium: 2 mg/dL (ref 1.7–2.4)

## 2020-04-20 MED ORDER — INSULIN DETEMIR 100 UNIT/ML ~~LOC~~ SOLN
20.0000 [IU] | Freq: Every day | SUBCUTANEOUS | Status: DC
  Administered 2020-04-20 – 2020-04-21 (×2): 20 [IU] via SUBCUTANEOUS
  Filled 2020-04-20 (×3): qty 0.2

## 2020-04-20 MED ORDER — BISACODYL 10 MG RE SUPP
10.0000 mg | Freq: Two times a day (BID) | RECTAL | Status: DC
  Administered 2020-04-21 – 2020-04-25 (×6): 10 mg via RECTAL
  Filled 2020-04-20 (×8): qty 1

## 2020-04-20 MED ORDER — POLYETHYLENE GLYCOL 3350 17 G PO PACK
17.0000 g | PACK | Freq: Two times a day (BID) | ORAL | Status: DC
  Administered 2020-04-20 – 2020-04-26 (×12): 17 g via ORAL
  Filled 2020-04-20 (×13): qty 1

## 2020-04-20 NOTE — Progress Notes (Signed)
  Subjective: Patient appears confused.  Denies any abdominal pain.  Objective: Vital signs in last 24 hours: Temp:  [96.9 F (36.1 C)-98.9 F (37.2 C)] 98.1 F (36.7 C) (03/29 0600) Pulse Rate:  [68-95] 68 (03/29 0600) Resp:  [16-33] 18 (03/29 0600) BP: (115-168)/(38-82) 122/76 (03/29 0600) SpO2:  [92 %-100 %] 92 % (03/29 0600) Last BM Date: 04/19/20  Intake/Output from previous day: 03/28 0701 - 03/29 0700 In: 1380.9 [I.V.:1281; IV Piggyback:99.9] Out: 200 [Urine:200] Intake/Output this shift: No intake/output data recorded.  General appearance: alert, cooperative and no distress GI: Abdomen much softer and less distended.  Bowel sounds present.  Nontender.  Lab Results:  Recent Labs    04/19/20 0422 04/20/20 0534  WBC 6.9 7.9  HGB 12.1 11.5*  HCT 35.1* 34.5*  PLT 219 201   BMET Recent Labs    04/19/20 0422 04/20/20 0534  NA 137 135  K 3.0* 3.5  CL 101 105  CO2 20* 20*  GLUCOSE 137* 169*  BUN 14 11  CREATININE 0.60 0.58  CALCIUM 7.7* 7.6*   PT/INR No results for input(s): LABPROT, INR in the last 72 hours.  Studies/Results: CT Abdomen Pelvis W Contrast  Result Date: 04/18/2020 CLINICAL DATA:  Nausea, vomiting, abdominal pain EXAM: CT ABDOMEN AND PELVIS WITH CONTRAST TECHNIQUE: Multidetector CT imaging of the abdomen and pelvis was performed using the standard protocol following bolus administration of intravenous contrast. CONTRAST:  157mL OMNIPAQUE IOHEXOL 300 MG/ML  SOLN COMPARISON:  03/28/2017 FINDINGS: Lower chest: No acute abnormality. Hepatobiliary: No solid liver abnormality is seen. Definitively benign subcapsular hemangioma of the right lobe of the liver, unchanged (series 2, image 21). No gallstones, gallbladder wall thickening, or biliary dilatation. Pancreas: Unremarkable. No pancreatic ductal dilatation or surrounding inflammatory changes. Spleen: Normal in size without significant abnormality. Adrenals/Urinary Tract: Adrenal glands are  unremarkable. Kidneys are normal, without renal calculi, solid lesion, or hydronephrosis. Bladder is unremarkable. Stomach/Bowel: The stomach is fluid filled and the proximal small bowel is mildly distended, largest loops measuring up to 3.8 cm. There is an abrupt caliber change of the mid to distal small bowel in the midline ventral abdomen (series 2, image 53, series 5, image 43), with relatively decompressed, although still gas and fluid containing distal small bowel. Appendix appears normal. Large burden of stool and stool balls throughout the colon with numerous large stool balls in the rectum. Vascular/Lymphatic: Aortic atherosclerosis. No enlarged abdominal or pelvic lymph nodes. Reproductive: No mass or other significant abnormality. Other: No abdominal wall hernia or abnormality. Small volume ascites throughout the abdomen. Musculoskeletal: No acute or significant osseous findings. IMPRESSION: 1. The stomach is fluid filled and the proximal small bowel is mildly distended. There is an abrupt caliber change of the mid to distal small bowel in the midline ventral abdomen, with relatively decompressed, although still gas and fluid containing distal small bowel. Findings are concerning for partial or developing small bowel obstruction. 2. Large burden of stool and stool balls throughout the colon with numerous large stool balls in the rectum. Correlate for fecal impaction. 3. Small volume ascites. Electronically Signed   By: Eddie Candle M.D.   On: 04/18/2020 12:23    Anti-infectives: Anti-infectives (From admission, onward)   None      Assessment/Plan: Impression: Partial small bowel obstruction with overlying constipation, resolving.  No need for surgical intervention at this time. Plan: Will advance to full liquid diet.  We will also give cathartic.  LOS: 2 days    Aviva Signs 04/20/2020

## 2020-04-20 NOTE — Progress Notes (Signed)
PROGRESS NOTE    [name]  FHL:[id number] DOB: Feb 18, 1945 DOA: 04/18/2020 PCP: [name], MD   Brief Narrative:   Courtney Schnelle Thorpeis a 75 y.o.femalewith medical history significant forschizophrenia, dyslipidemia, chronic pain, GERD, type 2 diabetes, atrial fibrillation on Eliquis, and chronic iron deficiency anemia who was brought to the ED from her skilled facility due to worsening abdominal pain and mild distention. Patient apparently had nausea and vomiting as well over the last 4 days and it is uncertain whether or not she has been able to keep down her medications.  Patient was admitted with partial SBO as well as atrial fibrillation with RVR.  Heart rate is now well controlled and in sinus rhythm.  She has been transitioned to her oral medications with the exception of Eliquis and still remains on heparin drip.  Eliquis can be resumed at discharge.  Diet advanced to full liquids today.  Assessment & Plan:   Active Problems:   SBO (small bowel obstruction) (HCC)   Partial SBO-improving -Advance to full liquid per general surgery today -Hold further IV fluid -Focus on goal potassium of 4 and magnesium of 2 -Appreciate general surgery evaluation and recommendations -Zofran as needed for nausea and vomiting -Hopeful discharge back to [facility] [place] [facility] tomorrow if tolerating diet  Atrial fibrillation with RVR-currently in sinus rhythm -Likely due to acute illness along with missed doses of Cardizem -Diltiazem drip initiated in the ED, now transition to oral -Hold Eliquis and keep on heparin drip until discharge -Monitor on telemetry  Type 2 diabetes -Prior hemoglobin A1c 9.2% on 01/2020 -Blood glucose currently stable -Maintain on SSI -Resume long-acting insulin at half dose as diet has been advanced to full liquid today  GERD -PPI IV daily for now  History of schizophrenia -Resumed home oral medications -Haldol as needed for agitation  Chronic  pain -Some home medications restarted -IV pain medications as needed  Iron deficiency anemia-stable -Currently appears stable with no other bleeding -Follow CBC to monitor while on heparin drip -Holdhome iron supplementation -Stool occult negative for bleeding per EDP  Low TSH -Free T4 elevated at 1.52 -Recommend endocrinology follow-up outpatient   DVT prophylaxis:Heparin drip Code Status: Full Family Communication:  Tried calling guardian [name] with no response multiple times Disposition Plan:  Status is: Inpatient  Remains inpatient appropriate because:Persistent severe electrolyte disturbances, IV treatments appropriate due to intensity of illness or inability to take PO and Inpatient level of care appropriate due to severity of illness   Dispo: The patient is from:  [facility]  Anticipated d/c is to:  [facility]  Patient currently is not medically stable to d/c.              Difficult to place patient No  Consultants:   General surgery  Procedures:   See below  Antimicrobials:   None  Subjective: Patient seen and evaluated today with some mild confusion noted.  No abdominal pain, nausea or vomiting noted.  Diet has been advanced by general surgery.  Objective: Vitals:   04/19/20 2100 04/19/20 2144 04/20/20 0207 04/20/20 0600  BP: (!) 125/48 (!) 122/53 (!) 160/82 122/76  Pulse: 77 73 93 68  Resp: 20 20 18 18   Temp:  98.1 F (36.7 C) 98.9 F (37.2 C) 98.1 F (36.7 C)  TempSrc:  Oral    SpO2: 99% 95% 92% 92%  Weight:      Height:        Intake/Output Summary (Last 24 hours)  at 04/20/2020 0809 Last data filed at 04/19/2020 2147 Gross per 24 hour  Intake 1380.9 ml  Output 200 ml  Net 1180.9 ml   Filed Weights   04/18/20 1029  Weight: 57.6 kg    Examination:  General exam: Appears calm and comfortable, confused Respiratory system: Clear to auscultation. Respiratory effort  normal. Cardiovascular system: S1 & S2 heard, RRR.  Gastrointestinal system: Abdomen is soft, mild abdominal distention Central nervous system: Alert and awake Extremities: No edema Skin: No significant lesions noted Psychiatry: Flat affect.    Data Reviewed: I have personally reviewed following labs and imaging studies  CBC: Recent Labs  Lab 04/18/20 1105 04/19/20 0422 04/20/20 0534  WBC 9.5 6.9 7.9  NEUTROABS 7.9*  --   --   HGB 13.8 12.1 11.5*  HCT 39.6 35.1* 34.5*  MCV 86.7 88.2 88.9  PLT 250 219 034   Basic Metabolic Panel: Recent Labs  Lab 04/18/20 1105 04/19/20 0422 04/20/20 0534  NA 134* 137 135  K 3.0* 3.0* 3.5  CL 93* 101 105  CO2 29 20* 20*  GLUCOSE 97 137* 169*  BUN 12 14 11   CREATININE 0.61 0.60 0.58  CALCIUM 8.5* 7.7* 7.6*  MG  --  1.8 2.0   GFR: Estimated Creatinine Clearance: 55.5 mL/min (by C-G formula based on SCr of 0.58 mg/dL). Liver Function Tests: Recent Labs  Lab 04/18/20 1105 04/19/20 0422 04/20/20 0534  AST 18 15 17   ALT 13 11 13   ALKPHOS 53 46 47  BILITOT 0.7 0.9 0.8  PROT 6.7 5.9* 5.8*  ALBUMIN 3.4* 2.9* 2.9*   Recent Labs  Lab 04/18/20 1105  LIPASE 19   No results for input(s): AMMONIA in the last 168 hours. Coagulation Profile: No results for input(s): INR, PROTIME in the last 168 hours. Cardiac Enzymes: No results for input(s): CKTOTAL, CKMB, CKMBINDEX, TROPONINI in the last 168 hours. BNP (last 3 results) No results for input(s): PROBNP in the last 8760 hours. HbA1C: No results for input(s): HGBA1C in the last 72 hours. CBG: Recent Labs  Lab 04/19/20 1618 04/19/20 1937 04/20/20 0116 04/20/20 0344 04/20/20 0717  GLUCAP 133* 129* 155* 151* 162*   Lipid Profile: No results for input(s): CHOL, HDL, LDLCALC, TRIG, CHOLHDL, LDLDIRECT in the last 72 hours. Thyroid Function Tests: Recent Labs    04/18/20 1105 04/19/20 1110  TSH 0.293*  --   FREET4  --  1.52*   Anemia Panel: No results for input(s):  VITAMINB12, FOLATE, FERRITIN, TIBC, IRON, RETICCTPCT in the last 72 hours. Sepsis Labs: No results for input(s): PROCALCITON, LATICACIDVEN in the last 168 hours.  Recent Results (from the past 240 hour(s))  Resp Panel by RT-PCR (Flu A&B, Covid) Nasopharyngeal Swab     Status: None   Collection Time: 04/18/20  1:30 PM   Specimen: Nasopharyngeal Swab; Nasopharyngeal(NP) swabs in vial transport medium  Result Value Ref Range Status   SARS Coronavirus 2 by RT PCR NEGATIVE NEGATIVE Final    Comment: (NOTE) SARS-CoV-2 target nucleic acids are NOT DETECTED.  The SARS-CoV-2 RNA is generally detectable in upper respiratory specimens during the acute phase of infection. The lowest concentration of SARS-CoV-2 viral copies this assay can detect is 138 copies/mL. A negative result does not preclude SARS-Cov-2 infection and should not be used as the sole basis for treatment or other patient management decisions. A negative result may occur with  improper specimen collection/handling, submission of specimen other than nasopharyngeal swab, presence of viral mutation(s) within the areas targeted by  this assay, and inadequate number of viral copies(<138 copies/mL). A negative result must be combined with clinical observations, patient history, and epidemiological information. The expected result is Negative.  Fact Sheet for Patients:  EntrepreneurPulse.com.au  Fact Sheet for Healthcare Providers:  IncredibleEmployment.be  This test is no t yet approved or cleared by the [place] FDA and  has been authorized for detection and/or diagnosis of SARS-CoV-2 by FDA under an Emergency Use Authorization (EUA). This EUA will remain  in effect (meaning this test can be used) for the duration of the COVID-19 declaration under Section 564(b)(1) of the Act, 21 U.S.C.section 360bbb-3(b)(1), unless the authorization is terminated  or revoked sooner.       Influenza A  by PCR NEGATIVE NEGATIVE Final   Influenza B by PCR NEGATIVE NEGATIVE Final    Comment: (NOTE) The Xpert Xpress SARS-CoV-2/FLU/RSV plus assay is intended as an aid in the diagnosis of influenza from Nasopharyngeal swab specimens and should not be used as a sole basis for treatment. Nasal washings and aspirates are unacceptable for Xpert Xpress SARS-CoV-2/FLU/RSV testing.  Fact Sheet for Patients: EntrepreneurPulse.com.au  Fact Sheet for Healthcare Providers: IncredibleEmployment.be  This test is not yet approved or cleared by the [place] FDA and has been authorized for detection and/or diagnosis of SARS-CoV-2 by FDA under an Emergency Use Authorization (EUA). This EUA will remain in effect (meaning this test can be used) for the duration of the COVID-19 declaration under Section 564(b)(1) of the Act, 21 U.S.C. section 360bbb-3(b)(1), unless the authorization is terminated or revoked.  Performed at [facility], [address], [place], [place] [address]   MRSA PCR Screening     Status: None   Collection Time: 04/18/20  8:47 PM   Specimen: Nasopharyngeal  Result Value Ref Range Status   MRSA by PCR NEGATIVE NEGATIVE Final    Comment:        The GeneXpert MRSA Assay (FDA approved for NASAL specimens only), is one component of a comprehensive MRSA colonization surveillance program. It is not intended to diagnose MRSA infection nor to guide or monitor treatment for MRSA infections. Performed at [facility], [address], [place],  [address]          Radiology Studies: CT Abdomen Pelvis W Contrast  Result Date: 04/18/2020 CLINICAL DATA:  Nausea, vomiting, abdominal pain EXAM: CT ABDOMEN AND PELVIS WITH CONTRAST TECHNIQUE: Multidetector CT imaging of the abdomen and pelvis was performed using the standard protocol following bolus administration of intravenous contrast. CONTRAST:  142mL OMNIPAQUE IOHEXOL 300 MG/ML  SOLN  COMPARISON:  03/28/2017 FINDINGS: Lower chest: No acute abnormality. Hepatobiliary: No solid liver abnormality is seen. Definitively benign subcapsular hemangioma of the right lobe of the liver, unchanged (series 2, image 21). No gallstones, gallbladder wall thickening, or biliary dilatation. Pancreas: Unremarkable. No pancreatic ductal dilatation or surrounding inflammatory changes. Spleen: Normal in size without significant abnormality. Adrenals/Urinary Tract: Adrenal glands are unremarkable. Kidneys are normal, without renal calculi, solid lesion, or hydronephrosis. Bladder is unremarkable. Stomach/Bowel: The stomach is fluid filled and the proximal small bowel is mildly distended, largest loops measuring up to 3.8 cm. There is an abrupt caliber change of the mid to distal small bowel in the midline ventral abdomen (series 2, image 53, series 5, image 43), with relatively decompressed, although still gas and fluid containing distal small bowel. Appendix appears normal. Large burden of stool and stool balls throughout the colon with numerous large stool balls in the rectum. Vascular/Lymphatic: Aortic atherosclerosis. No enlarged  abdominal or pelvic lymph nodes. Reproductive: No mass or other significant abnormality. Other: No abdominal wall hernia or abnormality. Small volume ascites throughout the abdomen. Musculoskeletal: No acute or significant osseous findings. IMPRESSION: 1. The stomach is fluid filled and the proximal small bowel is mildly distended. There is an abrupt caliber change of the mid to distal small bowel in the midline ventral abdomen, with relatively decompressed, although still gas and fluid containing distal small bowel. Findings are concerning for partial or developing small bowel obstruction. 2. Large burden of stool and stool balls throughout the colon with numerous large stool balls in the rectum. Correlate for fecal impaction. 3. Small volume ascites. Electronically Signed   By: Eddie Candle M.D.   On: 04/18/2020 12:23        Scheduled Meds: . ARIPiprazole  2 mg Oral QHS  . atorvastatin  40 mg Oral QHS  . bisacodyl  10 mg Rectal BID AC & HS  . Chlorhexidine Gluconate Cloth  6 each Topical Daily  . diltiazem  120 mg Oral Daily  . DULoxetine  60 mg Oral Daily  . insulin aspart  0-9 Units Subcutaneous Q4H  . latanoprost  1 drop Both Eyes QHS  . metoprolol succinate  25 mg Oral Daily  . mirabegron ER  25 mg Oral Daily  . mirtazapine  30 mg Oral QHS  . pantoprazole (PROTONIX) IV  40 mg Intravenous Q24H  . polyethylene glycol  17 g Oral BID  . risperiDONE  1 mg Oral BID   Continuous Infusions: . sodium chloride 100 mL/hr at 04/20/20 0521  . heparin 950 Units/hr (04/19/20 2147)     LOS: 2 days    Time spent: 35 minutes    Fredna Stricker Darleen Crocker, DO [facility]  If 7PM-7AM, please contact night-coverage [email or web address] 04/20/2020, 8:09 AM

## 2020-04-20 NOTE — Progress Notes (Signed)
ANTICOAGULATION CONSULT NOTE -   Pharmacy Consult for heparin Indication: atrial fibrillation  Allergies  Allergen Reactions  . Sulfa Antibiotics Rash    Patient Measurements: Height: 5\' 5"  (165.1 cm) Weight: 57.6 kg (127 lb) IBW/kg (Calculated) : 57 Heparin Dosing Weight: 57.6 kg  Vital Signs: Temp: 98.1 F (36.7 C) (03/29 0600) Temp Source: Oral (03/28 2144) BP: 122/76 (03/29 0600) Pulse Rate: 68 (03/29 0600)  Labs: Recent Labs    04/18/20 1105 04/18/20 1351 04/18/20 2210 04/19/20 0422 04/19/20 1539 04/20/20 0534  HGB 13.8  --   --  12.1  --  11.5*  HCT 39.6  --   --  35.1*  --  34.5*  PLT 250  --   --  219  --  201  APTT  --  33   < > 63* 71* 78*  HEPARINUNFRC  --  >2.20*  --  1.96*  --  1.01*  CREATININE 0.61  --   --  0.60  --  0.58   < > = values in this interval not displayed.    Estimated Creatinine Clearance: 55.5 mL/min (by C-G formula based on SCr of 0.58 mg/dL).   Medical History: Past Medical History:  Diagnosis Date  . Anxiety   . Arthritis   . COVID-19 virus infection    COVID-19+ approx 01/24/19; asymptomatic course with full recovery  . Dependence on wheelchair    pivot/transfers  . Depression    History of psychosis and previous suicide attempt  . DVT, lower extremity, recurrent (HCC)    Long-term Coumadin per Dr. Legrand Rams  . Essential hypertension   . GERD (gastroesophageal reflux disease)   . Hemiplegia ([place]) 2010   Left side  . History of stroke    Acute infarct and right cerebral white matter small vessel disease 12/10  . Leg DVT (deep venous thromboembolism), acute ([place]) 2006  . Schizophrenia ([place])   . Stroke [facility])    left sided weakness  . Type 2 diabetes mellitus (HCC)     Medications:  Medications Prior to Admission  Medication Sig Dispense Refill Last Dose  . [START ON 04/26/2020] ABILIFY 2 MG tablet Take 1 tablet (2 mg total) by mouth at bedtime. 30 tablet 4 04/17/2020 at Unknown time  . acetaminophen (TYLENOL) 325 MG  tablet Take 650 mg by mouth 3 (three) times daily as needed for mild pain or moderate pain.      Marland Kitchen apixaban (ELIQUIS) 5 MG TABS tablet Take 1 tablet (5 mg total) by mouth 2 (two) times daily. 60 tablet 0 04/17/2020 at 2000  . atorvastatin (LIPITOR) 40 MG tablet Take 1 tablet (40 mg total) by mouth at bedtime. 60 tablet 0 04/17/2020 at Unknown time  . calcium carbonate (TUMS - DOSED IN MG ELEMENTAL CALCIUM) 500 MG chewable tablet Chew 1 tablet by mouth 4 (four) times daily as needed for heartburn.     . Carboxymethylcellulose Sodium (ARTIFICIAL TEARS OP) Place 1 drop into both eyes 3 (three) times daily.   04/17/2020 at Unknown time  . Cholecalciferol (VITAMIN D3) 50 MCG (2000 UT) TABS Take 2,000 Units by mouth daily.   04/17/2020 at Unknown time  . clonazePAM (KLONOPIN) 0.5 MG tablet Take 1 tablet (0.5 mg total) by mouth 3 (three) times daily as needed for anxiety. 90 tablet 2 04/17/2020 at Unknown time  . diltiazem (CARDIZEM CD) 120 MG 24 hr capsule Take 1 capsule (120 mg total) by mouth daily. 90 capsule 1 04/17/2020 at Unknown time  .  DULoxetine (CYMBALTA) 60 MG capsule Take 60 mg by mouth daily.   04/17/2020 at Unknown time  . FERREX 150 150 MG capsule Take 150 mg by mouth daily.   04/17/2020 at Unknown time  . gabapentin (NEURONTIN) 300 MG capsule Take 1 capsule (300 mg total) by mouth 3 (three) times daily as needed.   04/17/2020 at Unknown time  . HYDROcodone-acetaminophen (NORCO) 10-325 MG tablet Take 1 tablet by mouth 3 (three) times daily as needed. 30 tablet  04/17/2020 at Unknown time  . hydrocortisone (ANUSOL-HC) 2.5 % rectal cream Place 1 application rectally 2 (two) times daily as needed (for rectal bleeding).     . hydrOXYzine (ATARAX/VISTARIL) 25 MG tablet Take 25 mg by mouth 3 (three) times daily.   04/17/2020 at Unknown time  . insulin detemir (LEVEMIR FLEXTOUCH) 100 UNIT/ML FlexPen Inject 38 Units into the skin at bedtime. (Patient taking differently: Inject 42 Units into the skin at bedtime.)  15 mL 3 04/17/2020 at Unknown time  . latanoprost (XALATAN) 0.005 % ophthalmic solution Place 1 drop into both eyes at bedtime.   04/17/2020 at Unknown time  . loratadine (CLARITIN) 10 MG tablet Take 10 mg by mouth daily.   04/17/2020 at Unknown time  . lubiprostone (AMITIZA) 24 MCG capsule Take 1 capsule (24 mcg total) by mouth 2 (two) times daily with a meal. 60 capsule 5 04/17/2020 at Unknown time  . metoprolol succinate (TOPROL-XL) 25 MG 24 hr tablet Take 1 tablet (25 mg total) by mouth daily. 90 tablet 3 04/17/2020 at 0800  . midodrine (PROAMATINE) 10 MG tablet TAKE (1) TABLET BY MOUTH (3) TIMES DAILY. (Patient taking differently: Take 10 mg by mouth 3 (three) times daily.) 90 tablet 3 04/17/2020 at Unknown time  . mirabegron ER (MYRBETRIQ) 25 MG TB24 tablet Take 25 mg by mouth daily.   04/17/2020 at Unknown time  . mirtazapine (REMERON) 30 MG tablet Take 30 mg by mouth at bedtime.   04/17/2020 at Unknown time  . ondansetron (ZOFRAN) 4 MG tablet Take 1 tablet (4 mg total) by mouth 4 (four) times daily -  before meals and at bedtime. (Patient taking differently: Take 4 mg by mouth 2 (two) times daily as needed for nausea or vomiting.) 120 tablet 3 04/17/2020 at Unknown time  . pantoprazole (PROTONIX) 40 MG tablet Take 1 tablet (40 mg total) by mouth daily. 270 tablet 3 04/17/2020 at Unknown time  . polyethylene glycol (MIRALAX / GLYCOLAX) 17 g packet Take 17 g by mouth daily. 14 each 0 04/17/2020 at Unknown time  . [START ON 04/26/2020] risperiDONE (RISPERDAL) 1 MG tablet Take 1 tablet (1 mg total) by mouth in the morning and at bedtime. 60 tablet 4 04/17/2020 at Unknown time  . senna (SENOKOT) 8.6 MG TABS tablet Take 1 tablet (8.6 mg total) by mouth daily. 120 tablet 0 04/17/2020 at Unknown time  . simethicone (MYLICON) 80 MG chewable tablet Chew 80 mg by mouth 3 (three) times daily.   04/17/2020 at Unknown time  . tiZANidine (ZANAFLEX) 2 MG tablet Take 1 tablet (2 mg total) by mouth daily as needed for muscle  spasms. 30 tablet    . TRADJENTA 5 MG TABS tablet TAKE (1) TABLET BY MOUTH ONCE DAILY. (Patient taking differently: Take 5 mg by mouth daily.) 30 tablet 0 04/17/2020 at Unknown time  . zolpidem (AMBIEN) 10 MG tablet Take 10 mg by mouth at bedtime as needed for sleep.   04/17/2020 at Unknown time  . simvastatin (ZOCOR) 20  MG tablet Take 20 mg by mouth daily.       Assessment: Pharmacy consulted to dose heparin in patient with atrial fibrillation.  Patient is on apixaban prior to admission with last dose given 3/26 @ 2000.  Will dose based on aPTT until correlation with heparin levels.  CBC WNL APTT 78- therapeutic  Goal of Therapy:  Heparin level 0.3-0.7 units/ml aPTT 66-102 seconds Monitor platelets by anticoagulation protocol: Yes   Plan:  Continue heparin infusion at 950 units/hr Check APTT and heparin level daily while on heparin Continue to monitor H&H and platelets.  Thomasenia Sales, PharmD, MBA, BCGP Clinical Pharmacist  04/20/2020 7:52 AM

## 2020-04-21 DIAGNOSIS — I4891 Unspecified atrial fibrillation: Secondary | ICD-10-CM | POA: Diagnosis not present

## 2020-04-21 DIAGNOSIS — K56609 Unspecified intestinal obstruction, unspecified as to partial versus complete obstruction: Secondary | ICD-10-CM | POA: Diagnosis not present

## 2020-04-21 LAB — COMPREHENSIVE METABOLIC PANEL
ALT: 13 U/L (ref 0–44)
AST: 17 U/L (ref 15–41)
Albumin: 2.8 g/dL — ABNORMAL LOW (ref 3.5–5.0)
Alkaline Phosphatase: 48 U/L (ref 38–126)
Anion gap: 10 (ref 5–15)
BUN: 9 mg/dL (ref 8–23)
CO2: 23 mmol/L (ref 22–32)
Calcium: 7.8 mg/dL — ABNORMAL LOW (ref 8.9–10.3)
Chloride: 104 mmol/L (ref 98–111)
Creatinine, Ser: 0.57 mg/dL (ref 0.44–1.00)
GFR, Estimated: >60 mL/min (ref >60)
Glucose, Bld: 174 mg/dL — ABNORMAL HIGH (ref 70–99)
Potassium: 3.2 mmol/L — ABNORMAL LOW (ref 3.5–5.1)
Sodium: 137 mmol/L (ref 135–145)
Total Bilirubin: 1 mg/dL (ref 0.3–1.2)
Total Protein: 5.9 g/dL — ABNORMAL LOW (ref 6.5–8.1)

## 2020-04-21 LAB — MAGNESIUM: Magnesium: 2 mg/dL (ref 1.7–2.4)

## 2020-04-21 LAB — GLUCOSE, CAPILLARY
Glucose-Capillary: 129 mg/dL — ABNORMAL HIGH (ref 70–99)
Glucose-Capillary: 153 mg/dL — ABNORMAL HIGH (ref 70–99)
Glucose-Capillary: 163 mg/dL — ABNORMAL HIGH (ref 70–99)
Glucose-Capillary: 167 mg/dL — ABNORMAL HIGH (ref 70–99)
Glucose-Capillary: 171 mg/dL — ABNORMAL HIGH (ref 70–99)
Glucose-Capillary: 198 mg/dL — ABNORMAL HIGH (ref 70–99)

## 2020-04-21 LAB — HEPARIN LEVEL (UNFRACTIONATED): Heparin Unfractionated: 0.42 IU/mL (ref 0.30–0.70)

## 2020-04-21 LAB — CBC
HCT: 36.3 % (ref 36.0–46.0)
Hemoglobin: 12.3 g/dL (ref 12.0–15.0)
MCH: 29.9 pg (ref 26.0–34.0)
MCHC: 33.9 g/dL (ref 30.0–36.0)
MCV: 88.3 fL (ref 80.0–100.0)
Platelets: 223 10*3/uL (ref 150–400)
RBC: 4.11 MIL/uL (ref 3.87–5.11)
RDW: 12.5 % (ref 11.5–15.5)
WBC: 6.2 10*3/uL (ref 4.0–10.5)
nRBC: 0 % (ref 0.0–0.2)

## 2020-04-21 LAB — APTT: aPTT: 65 seconds — ABNORMAL HIGH (ref 24–36)

## 2020-04-21 MED ORDER — DILTIAZEM HCL ER COATED BEADS 180 MG PO CP24
180.0000 mg | ORAL_CAPSULE | Freq: Every day | ORAL | Status: DC
  Administered 2020-04-22 – 2020-04-26 (×5): 180 mg via ORAL
  Filled 2020-04-21 (×5): qty 1

## 2020-04-21 MED ORDER — HYDROCODONE-ACETAMINOPHEN 10-325 MG PO TABS: 1.0000 | ORAL_TABLET | Freq: Three times a day (TID) | ORAL | Status: DC | PRN

## 2020-04-21 MED ORDER — HYDROCODONE-ACETAMINOPHEN 10-325 MG PO TABS
1.0000 | ORAL_TABLET | Freq: Three times a day (TID) | ORAL | Status: DC | PRN
  Administered 2020-04-21: 1 via ORAL
  Filled 2020-04-21: qty 1

## 2020-04-21 MED ORDER — APIXABAN 5 MG PO TABS
5.0000 mg | ORAL_TABLET | Freq: Two times a day (BID) | ORAL | Status: DC
  Administered 2020-04-21 – 2020-04-26 (×11): 5 mg via ORAL
  Filled 2020-04-21 (×11): qty 1

## 2020-04-21 MED ORDER — POTASSIUM CHLORIDE CRYS ER 20 MEQ PO TBCR
40.0000 meq | EXTENDED_RELEASE_TABLET | Freq: Once | ORAL | Status: AC
  Administered 2020-04-21: 40 meq via ORAL
  Filled 2020-04-21: qty 2

## 2020-04-21 MED ORDER — DILTIAZEM HCL-DEXTROSE 125-5 MG/125ML-% IV SOLN (PREMIX)
5.0000 mg/h | INTRAVENOUS | Status: DC
  Administered 2020-04-21: 5 mg/h via INTRAVENOUS
  Filled 2020-04-21: qty 125

## 2020-04-21 MED ORDER — HYDROMORPHONE HCL 1 MG/ML IJ SOLN
0.2500 mg | INTRAMUSCULAR | Status: DC | PRN
  Administered 2020-04-22 – 2020-04-23 (×4): 0.25 mg via INTRAVENOUS
  Filled 2020-04-21 (×5): qty 0.5

## 2020-04-21 MED ORDER — DILTIAZEM HCL 60 MG PO TABS
60.0000 mg | ORAL_TABLET | Freq: Once | ORAL | Status: DC
  Filled 2020-04-21: qty 1

## 2020-04-21 NOTE — Progress Notes (Signed)
   04/21/20 1130  Assess: MEWS Score  Temp (!) 97.5 F (36.4 C)  BP (!) 96/56  Pulse Rate (!) 141  Resp 18  SpO2 94 %  O2 Device Room Air  Assess: MEWS Score  MEWS Temp 0  MEWS Systolic 1  MEWS Pulse 3  MEWS RR 0  MEWS LOC 0  MEWS Score 4  MEWS Score Color Red  Assess: if the MEWS score is Yellow or Red  Were vital signs taken at a resting state? Yes  Focused Assessment Change from prior assessment (see assessment flowsheet)  Early Detection of Sepsis Score *See Row Information* Low  MEWS guidelines implemented *See Row Information* Yes  Treat  MEWS Interventions Escalated (See documentation below)  Take Vital Signs  Increase Vital Sign Frequency  Red: Q 1hr X 4 then Q 4hr X 4, if remains red, continue Q 4hrs  Escalate  MEWS: Escalate Red: discuss with charge nurse/RN and provider, consider discussing with RRT  Notify: Charge Nurse/RN  Name of Charge Nurse/RN Notified Lowry Ram RN  Date Charge Nurse/RN Notified 04/21/20  Time Charge Nurse/RN Notified 1140  Notify: Provider  Provider Name/Title Dr. Wynetta Emery  Date Provider Notified 04/21/20  Time Provider Notified 1139  Notification Type Page  Notification Reason Change in status (previous yellow MEWS,)  Provider response See new orders  Date of Provider Response 04/21/20  Time of Provider Response 9741  Notify: Rapid Response  Name of Rapid Response RN Notified Ruel Favors RN  Date Rapid Response Notified 04/21/20  Time Rapid Response Notified 1140  Document  Patient Outcome Transferred/level of care increased

## 2020-04-21 NOTE — Progress Notes (Signed)
Patient complaining of back pain and asking for pain medication frequently. Educated patient that her pain medication cannot be given due to her soft blood pressures. Tylenol given and heat pack placed on patient's back.

## 2020-04-21 NOTE — Progress Notes (Signed)
ANTICOAGULATION CONSULT NOTE -   Pharmacy Consult for heparin>>apixaban Indication: atrial fibrillation  Allergies  Allergen Reactions  . Sulfa Antibiotics Rash    Patient Measurements: Height: 5\' 5"  (165.1 cm) Weight: 57.6 kg (127 lb) IBW/kg (Calculated) : 57 Heparin Dosing Weight: 57.6 kg  Vital Signs: Temp: 98.3 F (36.8 C) (03/30 0919) Temp Source: Oral (03/30 0919) BP: 101/73 (03/30 0919) Pulse Rate: 129 (03/30 0919)  Labs: Recent Labs    04/19/20 0422 04/19/20 1539 04/20/20 0534 04/21/20 0512  HGB 12.1  --  11.5* 12.3  HCT 35.1*  --  34.5* 36.3  PLT 219  --  201 223  APTT 63* 71* 78* 65*  HEPARINUNFRC 1.96*  --  1.01* 0.42  CREATININE 0.60  --  0.58 0.57    Estimated Creatinine Clearance: 55.5 mL/min (by C-G formula based on SCr of 0.57 mg/dL).   Medical History: Past Medical History:  Diagnosis Date  . Anxiety   . Arthritis   . COVID-19 virus infection    COVID-19+ approx 01/24/19; asymptomatic course with full recovery  . Dependence on wheelchair    pivot/transfers  . Depression    History of psychosis and previous suicide attempt  . DVT, lower extremity, recurrent (HCC)    Long-term Coumadin per Dr. Legrand Rams  . Essential hypertension   . GERD (gastroesophageal reflux disease)   . Hemiplegia ([place]) 2010   Left side  . History of stroke    Acute infarct and right cerebral white matter small vessel disease 12/10  . Leg DVT (deep venous thromboembolism), acute ([place]) 2006  . Schizophrenia ([place])   . Stroke [facility])    left sided weakness  . Type 2 diabetes mellitus (HCC)     Medications:  Medications Prior to Admission  Medication Sig Dispense Refill Last Dose  . [START ON 04/26/2020] ABILIFY 2 MG tablet Take 1 tablet (2 mg total) by mouth at bedtime. 30 tablet 4 04/17/2020 at Unknown time  . acetaminophen (TYLENOL) 325 MG tablet Take 650 mg by mouth 3 (three) times daily as needed for mild pain or moderate pain.      Marland Kitchen apixaban (ELIQUIS) 5 MG TABS  tablet Take 1 tablet (5 mg total) by mouth 2 (two) times daily. 60 tablet 0 04/17/2020 at 2000  . atorvastatin (LIPITOR) 40 MG tablet Take 1 tablet (40 mg total) by mouth at bedtime. 60 tablet 0 04/17/2020 at Unknown time  . calcium carbonate (TUMS - DOSED IN MG ELEMENTAL CALCIUM) 500 MG chewable tablet Chew 1 tablet by mouth 4 (four) times daily as needed for heartburn.     . Carboxymethylcellulose Sodium (ARTIFICIAL TEARS OP) Place 1 drop into both eyes 3 (three) times daily.   04/17/2020 at Unknown time  . Cholecalciferol (VITAMIN D3) 50 MCG (2000 UT) TABS Take 2,000 Units by mouth daily.   04/17/2020 at Unknown time  . clonazePAM (KLONOPIN) 0.5 MG tablet Take 1 tablet (0.5 mg total) by mouth 3 (three) times daily as needed for anxiety. 90 tablet 2 04/17/2020 at Unknown time  . diltiazem (CARDIZEM CD) 120 MG 24 hr capsule Take 1 capsule (120 mg total) by mouth daily. 90 capsule 1 04/17/2020 at Unknown time  . DULoxetine (CYMBALTA) 60 MG capsule Take 60 mg by mouth daily.   04/17/2020 at Unknown time  . FERREX 150 150 MG capsule Take 150 mg by mouth daily.   04/17/2020 at Unknown time  . gabapentin (NEURONTIN) 300 MG capsule Take 1 capsule (300 mg total) by mouth 3 (  three) times daily as needed.   04/17/2020 at Unknown time  . HYDROcodone-acetaminophen (NORCO) 10-325 MG tablet Take 1 tablet by mouth 3 (three) times daily as needed. 30 tablet  04/17/2020 at Unknown time  . hydrocortisone (ANUSOL-HC) 2.5 % rectal cream Place 1 application rectally 2 (two) times daily as needed (for rectal bleeding).     . hydrOXYzine (ATARAX/VISTARIL) 25 MG tablet Take 25 mg by mouth 3 (three) times daily.   04/17/2020 at Unknown time  . insulin detemir (LEVEMIR FLEXTOUCH) 100 UNIT/ML FlexPen Inject 38 Units into the skin at bedtime. (Patient taking differently: Inject 42 Units into the skin at bedtime.) 15 mL 3 04/17/2020 at Unknown time  . latanoprost (XALATAN) 0.005 % ophthalmic solution Place 1 drop into both eyes at bedtime.    04/17/2020 at Unknown time  . loratadine (CLARITIN) 10 MG tablet Take 10 mg by mouth daily.   04/17/2020 at Unknown time  . lubiprostone (AMITIZA) 24 MCG capsule Take 1 capsule (24 mcg total) by mouth 2 (two) times daily with a meal. 60 capsule 5 04/17/2020 at Unknown time  . metoprolol succinate (TOPROL-XL) 25 MG 24 hr tablet Take 1 tablet (25 mg total) by mouth daily. 90 tablet 3 04/17/2020 at 0800  . midodrine (PROAMATINE) 10 MG tablet TAKE (1) TABLET BY MOUTH (3) TIMES DAILY. (Patient taking differently: Take 10 mg by mouth 3 (three) times daily.) 90 tablet 3 04/17/2020 at Unknown time  . mirabegron ER (MYRBETRIQ) 25 MG TB24 tablet Take 25 mg by mouth daily.   04/17/2020 at Unknown time  . mirtazapine (REMERON) 30 MG tablet Take 30 mg by mouth at bedtime.   04/17/2020 at Unknown time  . ondansetron (ZOFRAN) 4 MG tablet Take 1 tablet (4 mg total) by mouth 4 (four) times daily -  before meals and at bedtime. (Patient taking differently: Take 4 mg by mouth 2 (two) times daily as needed for nausea or vomiting.) 120 tablet 3 04/17/2020 at Unknown time  . pantoprazole (PROTONIX) 40 MG tablet Take 1 tablet (40 mg total) by mouth daily. 270 tablet 3 04/17/2020 at Unknown time  . polyethylene glycol (MIRALAX / GLYCOLAX) 17 g packet Take 17 g by mouth daily. 14 each 0 04/17/2020 at Unknown time  . [START ON 04/26/2020] risperiDONE (RISPERDAL) 1 MG tablet Take 1 tablet (1 mg total) by mouth in the morning and at bedtime. 60 tablet 4 04/17/2020 at Unknown time  . senna (SENOKOT) 8.6 MG TABS tablet Take 1 tablet (8.6 mg total) by mouth daily. 120 tablet 0 04/17/2020 at Unknown time  . simethicone (MYLICON) 80 MG chewable tablet Chew 80 mg by mouth 3 (three) times daily.   04/17/2020 at Unknown time  . tiZANidine (ZANAFLEX) 2 MG tablet Take 1 tablet (2 mg total) by mouth daily as needed for muscle spasms. 30 tablet    . TRADJENTA 5 MG TABS tablet TAKE (1) TABLET BY MOUTH ONCE DAILY. (Patient taking differently: Take 5 mg by  mouth daily.) 30 tablet 0 04/17/2020 at Unknown time  . zolpidem (AMBIEN) 10 MG tablet Take 10 mg by mouth at bedtime as needed for sleep.   04/17/2020 at Unknown time  . simvastatin (ZOCOR) 20 MG tablet Take 20 mg by mouth daily.       Assessment: Pharmacy consulted to dose heparin in patient with atrial fibrillation.  Patient is on apixaban prior to admission with last dose given 3/26 @ 2000.  Will dose based on aPTT until correlation with heparin levels.  CBC WNL APTT 65 HL 0.42  Goal of Therapy:  Heparin level 0.3-0.7 units/ml aPTT 66-102 seconds Monitor platelets by anticoagulation protocol: Yes   Plan:  Stop heparin infusion Start apixaban 5 mg twice daily Continue to monitor H&H and platelets.  Margot Ables, PharmD Clinical Pharmacist 04/21/2020 10:20 AM

## 2020-04-21 NOTE — Progress Notes (Signed)
ANTICOAGULATION CONSULT NOTE -   Pharmacy Consult for heparin Indication: atrial fibrillation  Allergies  Allergen Reactions  . Sulfa Antibiotics Rash    Patient Measurements: Height: 5\' 5"  (165.1 cm) Weight: 57.6 kg (127 lb) IBW/kg (Calculated) : 57 Heparin Dosing Weight: 57.6 kg  Vital Signs: Temp: 97.8 F (36.6 C) (03/30 0357) BP: 97/57 (03/30 0357) Pulse Rate: 78 (03/30 0357)  Labs: Recent Labs    04/19/20 0422 04/19/20 1539 04/20/20 0534 04/21/20 0512  HGB 12.1  --  11.5* 12.3  HCT 35.1*  --  34.5* 36.3  PLT 219  --  201 223  APTT 63* 71* 78* 65*  HEPARINUNFRC 1.96*  --  1.01* 0.42  CREATININE 0.60  --  0.58 0.57    Estimated Creatinine Clearance: 55.5 mL/min (by C-G formula based on SCr of 0.57 mg/dL).   Medical History: Past Medical History:  Diagnosis Date  . Anxiety   . Arthritis   . COVID-19 virus infection    COVID-19+ approx 01/24/19; asymptomatic course with full recovery  . Dependence on wheelchair    pivot/transfers  . Depression    History of psychosis and previous suicide attempt  . DVT, lower extremity, recurrent (HCC)    Long-term Coumadin per Dr. Legrand Rams  . Essential hypertension   . GERD (gastroesophageal reflux disease)   . Hemiplegia ([place]) 2010   Left side  . History of stroke    Acute infarct and right cerebral white matter small vessel disease 12/10  . Leg DVT (deep venous thromboembolism), acute ([place]) 2006  . Schizophrenia ([place])   . Stroke [facility])    left sided weakness  . Type 2 diabetes mellitus (HCC)     Medications:  Medications Prior to Admission  Medication Sig Dispense Refill Last Dose  . [START ON 04/26/2020] ABILIFY 2 MG tablet Take 1 tablet (2 mg total) by mouth at bedtime. 30 tablet 4 04/17/2020 at Unknown time  . acetaminophen (TYLENOL) 325 MG tablet Take 650 mg by mouth 3 (three) times daily as needed for mild pain or moderate pain.      Marland Kitchen apixaban (ELIQUIS) 5 MG TABS tablet Take 1 tablet (5 mg total) by mouth 2  (two) times daily. 60 tablet 0 04/17/2020 at 2000  . atorvastatin (LIPITOR) 40 MG tablet Take 1 tablet (40 mg total) by mouth at bedtime. 60 tablet 0 04/17/2020 at Unknown time  . calcium carbonate (TUMS - DOSED IN MG ELEMENTAL CALCIUM) 500 MG chewable tablet Chew 1 tablet by mouth 4 (four) times daily as needed for heartburn.     . Carboxymethylcellulose Sodium (ARTIFICIAL TEARS OP) Place 1 drop into both eyes 3 (three) times daily.   04/17/2020 at Unknown time  . Cholecalciferol (VITAMIN D3) 50 MCG (2000 UT) TABS Take 2,000 Units by mouth daily.   04/17/2020 at Unknown time  . clonazePAM (KLONOPIN) 0.5 MG tablet Take 1 tablet (0.5 mg total) by mouth 3 (three) times daily as needed for anxiety. 90 tablet 2 04/17/2020 at Unknown time  . diltiazem (CARDIZEM CD) 120 MG 24 hr capsule Take 1 capsule (120 mg total) by mouth daily. 90 capsule 1 04/17/2020 at Unknown time  . DULoxetine (CYMBALTA) 60 MG capsule Take 60 mg by mouth daily.   04/17/2020 at Unknown time  . FERREX 150 150 MG capsule Take 150 mg by mouth daily.   04/17/2020 at Unknown time  . gabapentin (NEURONTIN) 300 MG capsule Take 1 capsule (300 mg total) by mouth 3 (three) times daily as needed.  04/17/2020 at Unknown time  . HYDROcodone-acetaminophen (NORCO) 10-325 MG tablet Take 1 tablet by mouth 3 (three) times daily as needed. 30 tablet  04/17/2020 at Unknown time  . hydrocortisone (ANUSOL-HC) 2.5 % rectal cream Place 1 application rectally 2 (two) times daily as needed (for rectal bleeding).     . hydrOXYzine (ATARAX/VISTARIL) 25 MG tablet Take 25 mg by mouth 3 (three) times daily.   04/17/2020 at Unknown time  . insulin detemir (LEVEMIR FLEXTOUCH) 100 UNIT/ML FlexPen Inject 38 Units into the skin at bedtime. (Patient taking differently: Inject 42 Units into the skin at bedtime.) 15 mL 3 04/17/2020 at Unknown time  . latanoprost (XALATAN) 0.005 % ophthalmic solution Place 1 drop into both eyes at bedtime.   04/17/2020 at Unknown time  . loratadine  (CLARITIN) 10 MG tablet Take 10 mg by mouth daily.   04/17/2020 at Unknown time  . lubiprostone (AMITIZA) 24 MCG capsule Take 1 capsule (24 mcg total) by mouth 2 (two) times daily with a meal. 60 capsule 5 04/17/2020 at Unknown time  . metoprolol succinate (TOPROL-XL) 25 MG 24 hr tablet Take 1 tablet (25 mg total) by mouth daily. 90 tablet 3 04/17/2020 at 0800  . midodrine (PROAMATINE) 10 MG tablet TAKE (1) TABLET BY MOUTH (3) TIMES DAILY. (Patient taking differently: Take 10 mg by mouth 3 (three) times daily.) 90 tablet 3 04/17/2020 at Unknown time  . mirabegron ER (MYRBETRIQ) 25 MG TB24 tablet Take 25 mg by mouth daily.   04/17/2020 at Unknown time  . mirtazapine (REMERON) 30 MG tablet Take 30 mg by mouth at bedtime.   04/17/2020 at Unknown time  . ondansetron (ZOFRAN) 4 MG tablet Take 1 tablet (4 mg total) by mouth 4 (four) times daily -  before meals and at bedtime. (Patient taking differently: Take 4 mg by mouth 2 (two) times daily as needed for nausea or vomiting.) 120 tablet 3 04/17/2020 at Unknown time  . pantoprazole (PROTONIX) 40 MG tablet Take 1 tablet (40 mg total) by mouth daily. 270 tablet 3 04/17/2020 at Unknown time  . polyethylene glycol (MIRALAX / GLYCOLAX) 17 g packet Take 17 g by mouth daily. 14 each 0 04/17/2020 at Unknown time  . [START ON 04/26/2020] risperiDONE (RISPERDAL) 1 MG tablet Take 1 tablet (1 mg total) by mouth in the morning and at bedtime. 60 tablet 4 04/17/2020 at Unknown time  . senna (SENOKOT) 8.6 MG TABS tablet Take 1 tablet (8.6 mg total) by mouth daily. 120 tablet 0 04/17/2020 at Unknown time  . simethicone (MYLICON) 80 MG chewable tablet Chew 80 mg by mouth 3 (three) times daily.   04/17/2020 at Unknown time  . tiZANidine (ZANAFLEX) 2 MG tablet Take 1 tablet (2 mg total) by mouth daily as needed for muscle spasms. 30 tablet    . TRADJENTA 5 MG TABS tablet TAKE (1) TABLET BY MOUTH ONCE DAILY. (Patient taking differently: Take 5 mg by mouth daily.) 30 tablet 0 04/17/2020 at  Unknown time  . zolpidem (AMBIEN) 10 MG tablet Take 10 mg by mouth at bedtime as needed for sleep.   04/17/2020 at Unknown time  . simvastatin (ZOCOR) 20 MG tablet Take 20 mg by mouth daily.       Assessment: Pharmacy consulted to dose heparin in patient with atrial fibrillation.  Patient is on apixaban prior to admission with last dose given 3/26 @ 2000.  Will dose based on aPTT until correlation with heparin levels.  CBC WNL APTT 65 HL 0.42  Goal of Therapy:  Heparin level 0.3-0.7 units/ml aPTT 66-102 seconds Monitor platelets by anticoagulation protocol: Yes   Plan:  Increase heparin infusion to 1000 units/hr Check APTT and heparin level daily while on heparin Continue to monitor H&H and platelets.  Margot Ables, PharmD Clinical Pharmacist 04/21/2020 8:02 AM

## 2020-04-21 NOTE — Progress Notes (Signed)
Patient transferred to ICU/SD for increased HR and decreased BP. Legal guardian, [name], called and notified via voicemail.

## 2020-04-21 NOTE — Progress Notes (Signed)
  Subjective: Patient refusing Dulcolax suppositories.  Denies any abdominal pain.  Appears somewhat confused.  Objective: Vital signs in last 24 hours: Temp:  [97.7 F (36.5 C)-98.3 F (36.8 C)] 98.3 F (36.8 C) (03/30 0919) Pulse Rate:  [78-129] 129 (03/30 0919) Resp:  [18-19] 18 (03/30 0919) BP: (97-136)/(57-81) 101/73 (03/30 0919) SpO2:  [94 %-98 %] 98 % (03/30 0919) Last BM Date: 04/19/20  Intake/Output from previous day: 03/29 0701 - 03/30 0700 In: 360 [P.O.:360] Out: 1500 [Urine:1500] Intake/Output this shift: Total I/O In: 240 [P.O.:240] Out: -   General appearance: alert, cooperative and no distress GI: soft, non-tender; bowel sounds normal; no masses,  no organomegaly  Lab Results:  Recent Labs    04/20/20 0534 04/21/20 0512  WBC 7.9 6.2  HGB 11.5* 12.3  HCT 34.5* 36.3  PLT 201 223   BMET Recent Labs    04/20/20 0534 04/21/20 0512  NA 135 137  K 3.5 3.2*  CL 105 104  CO2 20* 23  GLUCOSE 169* 174*  BUN 11 9  CREATININE 0.58 0.57  CALCIUM 7.6* 7.8*   PT/INR No results for input(s): LABPROT, INR in the last 72 hours.  Studies/Results: No results found.  Anti-infectives: Anti-infectives (From admission, onward)   None      Assessment/Plan: Impression: Partial small bowel obstruction with constipation, resolving.  No notation that patient is having nausea or vomiting.  She did have bowel movement yesterday evening. Plan: No need for surgical intervention at this time.  We will follow peripherally with you.  LOS: 3 days    Aviva Signs 04/21/2020

## 2020-04-21 NOTE — Progress Notes (Signed)
Heart rate ranging in the 80s-low 100s range. Monitor showing EKG pauses close to 2 seconds. Cardizem drip titrated to 2.5mg /hr. Dr. Wynetta Emery notified and is okay with this rate/dose change. Patient continues to call out for pain medicine after tylenol dose and heat packs placed on back. New orders received for pain management. See eMAR.

## 2020-04-21 NOTE — Progress Notes (Signed)
PROGRESS NOTE    [name]  PPJ:[id number] DOB: 10-10-1945 DOA: 04/18/2020 PCP: [name], MD   Brief Narrative:   Courtney Ola Thorpeis a 75 y.o.femalewith medical history significant forschizophrenia, dyslipidemia, chronic pain, GERD, type 2 diabetes, atrial fibrillation on Eliquis, and chronic iron deficiency anemia who was brought to the ED from her skilled facility due to worsening abdominal pain and mild distention. Patient apparently had nausea and vomiting as well over the last 4 days and it is uncertain whether or not she has been able to keep down her medications.  Patient was admitted with partial SBO as well as atrial fibrillation with RVR.  Heart rate is now well controlled and in sinus rhythm.  She has been transitioned to her oral medications with the exception of Eliquis and still remains on heparin drip.  Eliquis can be resumed at discharge.  Diet advanced as tolerated.   Assessment & Plan:   Active Problems:   SBO (small bowel obstruction) (HCC)  Partial SBO-improving -Advance to full liquid per general surgery today -Hold further IV fluid -Focus on goal potassium of 4 and magnesium of 2 -Appreciate general surgery evaluation and recommendations -Zofran as needed for nausea and vomiting -Hopeful discharge back to high [facility] tomorrow if tolerating diet  Atrial fibrillation with RVR-uncontrolled HR - Transfer to SDU and restart back on IV cardizem infusion -Likely due to acute illness along with missed doses of Cardizem -Resumed Eliquis  -Monitor on telemetry  Type 2 diabetes, uncontrolled -Prior hemoglobin A1c 9.2% on 01/2020 -Blood glucose currently stable -Maintain on SSI  GERD -PPI IV daily for now  History of schizophrenia -Resumed home oral medications -Haldol as needed for agitation  Chronic pain -Some home medications restarted -IV pain medications as needed  Iron deficiency anemia-stable -Currently appears stable with no  other bleeding -Follow CBC to monitor while on heparin drip -Holdhome iron supplementation -Stool occult negative for bleeding per EDP  Low TSH -Free T4 elevated at 1.52 -Recommend endocrinology follow-up outpatient   DVT prophylaxis: apixaban  Code Status: Full Family Communication:  Tried calling guardian [name] with no response multiple times Disposition Plan:  Status is: Inpatient  Remains inpatient appropriate because:Persistent severe electrolyte disturbances, IV treatments appropriate due to intensity of illness or inability to take PO and Inpatient level of care appropriate due to severity of illness   Dispo: The patient is from:  [facility]  Anticipated d/c is to:  [facility]  Patient currently is not medically stable to d/c.              Difficult to place patient No  Consultants:   General surgery  Procedures:   See below  Antimicrobials:   None  Subjective: Pt reports back pain and asking for anxiety medication as well.    Objective: Vitals:   04/21/20 0919 04/21/20 1130 04/21/20 1207 04/21/20 1213  BP: 101/73 (!) 96/56 124/81   Pulse: (!) 129 (!) 141  82  Resp: 18 18 (!) 26 17  Temp: 98.3 F (36.8 C) (!) 97.5 F (36.4 C)  97.6 F (36.4 C)  TempSrc: Oral Oral  Oral  SpO2: 98% 94%  97%  Weight:    57.8 kg  Height:    5\' 4"  (1.626 m)    Intake/Output Summary (Last 24 hours) at 04/21/2020 1231 Last data filed at 04/21/2020 0900 Gross per 24 hour  Intake 600 ml  Output 1500 ml  Net -900 ml   [employer]  04/18/20 1029 04/21/20 1213  Weight: 57.6 kg 57.8 kg    Examination:  General exam: elderly frail female with confusion, edentulous.  NAD.  Respiratory system: Clear to auscultation. Respiratory effort normal. Cardiovascular system: tachycardic, irregularly irregular.    Gastrointestinal system:  Soft, nondistended, nontender, no masses palpated.   Central nervous system:  awake, alert,  moving all extremities. Extremities: no CCE Skin: No significant lesions noted Psychiatry: Flat affect.  Data Reviewed: I have personally reviewed following labs and imaging studies  CBC: Recent Labs  Lab 04/18/20 1105 04/19/20 0422 04/20/20 0534 04/21/20 0512  WBC 9.5 6.9 7.9 6.2  NEUTROABS 7.9*  --   --   --   HGB 13.8 12.1 11.5* 12.3  HCT 39.6 35.1* 34.5* 36.3  MCV 86.7 88.2 88.9 88.3  PLT 250 219 201 938   Basic Metabolic Panel: Recent Labs  Lab 04/18/20 1105 04/19/20 0422 04/20/20 0534 04/21/20 0512  NA 134* 137 135 137  K 3.0* 3.0* 3.5 3.2*  CL 93* 101 105 104  CO2 29 20* 20* 23  GLUCOSE 97 137* 169* 174*  BUN 12 14 11 9   CREATININE 0.61 0.60 0.58 0.57  CALCIUM 8.5* 7.7* 7.6* 7.8*  MG  --  1.8 2.0 2.0   GFR: Estimated Creatinine Clearance: 53.3 mL/min (by C-G formula based on SCr of 0.57 mg/dL). Liver Function Tests: Recent Labs  Lab 04/18/20 1105 04/19/20 0422 04/20/20 0534 04/21/20 0512  AST 18 15 17 17   ALT 13 11 13 13   ALKPHOS 53 46 47 48  BILITOT 0.7 0.9 0.8 1.0  PROT 6.7 5.9* 5.8* 5.9*  ALBUMIN 3.4* 2.9* 2.9* 2.8*   Recent Labs  Lab 04/18/20 1105  LIPASE 19   No results for input(s): AMMONIA in the last 168 hours. Coagulation Profile: No results for input(s): INR, PROTIME in the last 168 hours. Cardiac Enzymes: No results for input(s): CKTOTAL, CKMB, CKMBINDEX, TROPONINI in the last 168 hours. BNP (last 3 results) No results for input(s): PROBNP in the last 8760 hours. HbA1C: No results for input(s): HGBA1C in the last 72 hours. CBG: Recent Labs  Lab 04/20/20 2021 04/21/20 0026 04/21/20 0356 04/21/20 0717 04/21/20 1104  GLUCAP 84 129* 167* 163* 198*   Lipid Profile: No results for input(s): CHOL, HDL, LDLCALC, TRIG, CHOLHDL, LDLDIRECT in the last 72 hours. Thyroid Function Tests: Recent Labs    04/19/20 1110  FREET4 1.52*   Anemia Panel: No results for input(s): VITAMINB12, FOLATE, FERRITIN, TIBC, IRON, RETICCTPCT in  the last 72 hours. Sepsis Labs: No results for input(s): PROCALCITON, LATICACIDVEN in the last 168 hours.  Recent Results (from the past 240 hour(s))  Resp Panel by RT-PCR (Flu A&B, Covid) Nasopharyngeal Swab     Status: None   Collection Time: 04/18/20  1:30 PM   Specimen: Nasopharyngeal Swab; Nasopharyngeal(NP) swabs in vial transport medium  Result Value Ref Range Status   SARS Coronavirus 2 by RT PCR NEGATIVE NEGATIVE Final    Comment: (NOTE) SARS-CoV-2 target nucleic acids are NOT DETECTED.  The SARS-CoV-2 RNA is generally detectable in upper respiratory specimens during the acute phase of infection. The lowest concentration of SARS-CoV-2 viral copies this assay can detect is 138 copies/mL. A negative result does not preclude SARS-Cov-2 infection and should not be used as the sole basis for treatment or other patient management decisions. A negative result may occur with  improper specimen collection/handling, submission of specimen other than nasopharyngeal swab, presence of viral mutation(s) within the areas targeted by this  assay, and inadequate number of viral copies(<138 copies/mL). A negative result must be combined with clinical observations, patient history, and epidemiological information. The expected result is Negative.  Fact Sheet for Patients:  EntrepreneurPulse.com.au  Fact Sheet for Healthcare Providers:  IncredibleEmployment.be  This test is no t yet approved or cleared by the [place] FDA and  has been authorized for detection and/or diagnosis of SARS-CoV-2 by FDA under an Emergency Use Authorization (EUA). This EUA will remain  in effect (meaning this test can be used) for the duration of the COVID-19 declaration under Section 564(b)(1) of the Act, 21 U.S.C.section 360bbb-3(b)(1), unless the authorization is terminated  or revoked sooner.       Influenza A by PCR NEGATIVE NEGATIVE Final   Influenza B by PCR  NEGATIVE NEGATIVE Final    Comment: (NOTE) The Xpert Xpress SARS-CoV-2/FLU/RSV plus assay is intended as an aid in the diagnosis of influenza from Nasopharyngeal swab specimens and should not be used as a sole basis for treatment. Nasal washings and aspirates are unacceptable for Xpert Xpress SARS-CoV-2/FLU/RSV testing.  Fact Sheet for Patients: EntrepreneurPulse.com.au  Fact Sheet for Healthcare Providers: IncredibleEmployment.be  This test is not yet approved or cleared by the [place] FDA and has been authorized for detection and/or diagnosis of SARS-CoV-2 by FDA under an Emergency Use Authorization (EUA). This EUA will remain in effect (meaning this test can be used) for the duration of the COVID-19 declaration under Section 564(b)(1) of the Act, 21 U.S.C. section 360bbb-3(b)(1), unless the authorization is terminated or revoked.  Performed at [facility], [address], [place], [place] [address]   MRSA PCR Screening     Status: None   Collection Time: 04/18/20  8:47 PM   Specimen: Nasopharyngeal  Result Value Ref Range Status   MRSA by PCR NEGATIVE NEGATIVE Final    Comment:        The GeneXpert MRSA Assay (FDA approved for NASAL specimens only), is one component of a comprehensive MRSA colonization surveillance program. It is not intended to diagnose MRSA infection nor to guide or monitor treatment for MRSA infections. Performed at [facility], [address] [place], [place] [address]    Radiology Studies: No results found.  Scheduled Meds: . apixaban  5 mg Oral BID  . ARIPiprazole  2 mg Oral QHS  . atorvastatin  40 mg Oral QHS  . bisacodyl  10 mg Rectal BID AC & HS  . Chlorhexidine Gluconate Cloth  6 each Topical Daily  . [START ON 04/22/2020] diltiazem  180 mg Oral Daily  . DULoxetine  60 mg Oral Daily  . insulin aspart  0-9 Units Subcutaneous Q4H  . insulin detemir  20 Units Subcutaneous QHS  . latanoprost  1  drop Both Eyes QHS  . metoprolol succinate  25 mg Oral Daily  . mirabegron ER  25 mg Oral Daily  . mirtazapine  30 mg Oral QHS  . pantoprazole (PROTONIX) IV  40 mg Intravenous Q24H  . polyethylene glycol  17 g Oral BID  . risperiDONE  1 mg Oral BID   Continuous Infusions: . diltiazem (CARDIZEM) infusion 5 mg/hr (04/21/20 1222)     LOS: 3 days   Time spent: 35 minutes  Mattison Stuckey Wynetta Emery, MD How to contact the [facility] Attending or Consulting provider [place] or covering provider during after hours [place], for this patient?  1. Check the care team in [facility] and look for a) attending/consulting TRH provider listed and b) the [facility]  team listed 2. Log into [email or web address] and use [place]'s universal password to access. If you do not have the password, please contact the hospital operator. 3. Locate the [facility] provider you are looking for under [facility] and page to a number that you can be directly reached. 4. If you still have difficulty reaching the provider, please page the [facility] (Director on Call) for the Hospitalists listed on amion for assistance.

## 2020-04-21 NOTE — Progress Notes (Signed)
   04/21/20 0919  Assess: MEWS Score  Temp 98.3 F (36.8 C)  BP 101/73  Pulse Rate (!) 129  Resp 18  SpO2 98 %  O2 Device Room Air  Patient Activity (if Appropriate) In bed  Assess: MEWS Score  MEWS Temp 0  MEWS Systolic 0  MEWS Pulse 2  MEWS RR 0  MEWS LOC 0  MEWS Score 2  MEWS Score Color Yellow  Assess: if the MEWS score is Yellow or Red  Were vital signs taken at a resting state? Yes  Focused Assessment No change from prior assessment  Early Detection of Sepsis Score *See Row Information* Low  MEWS guidelines implemented *See Row Information* Yes  Treat  Pain Scale 0-10  Pain Score 10  Pain Type Chronic pain  Pain Location Back  Pain Intervention(s) Medication (See eMAR)  Take Vital Signs  Increase Vital Sign Frequency  Yellow: Q 2hr X 2 then Q 4hr X 2, if remains yellow, continue Q 4hrs  Escalate  MEWS: Escalate Yellow: discuss with charge nurse/RN and consider discussing with provider and RRT  Notify: Charge Nurse/RN  Name of Charge Nurse/RN Notified Lowry Ram RN  Date Charge Nurse/RN Notified 04/21/20  Time Charge Nurse/RN Notified 0945  Notify: Provider  Provider Name/Title Dr. Wynetta Emery  Date Provider Notified 04/21/20  Time Provider Notified 1110  Notification Type Page  Notification Reason Other (Comment) (yellow MEWS, elevated HR)  Provider response See new orders  Date of Provider Response 04/21/20  Time of Provider Response 1115

## 2020-04-22 DIAGNOSIS — K56609 Unspecified intestinal obstruction, unspecified as to partial versus complete obstruction: Secondary | ICD-10-CM | POA: Diagnosis not present

## 2020-04-22 DIAGNOSIS — I10 Essential (primary) hypertension: Secondary | ICD-10-CM | POA: Diagnosis not present

## 2020-04-22 DIAGNOSIS — I4891 Unspecified atrial fibrillation: Secondary | ICD-10-CM

## 2020-04-22 DIAGNOSIS — I951 Orthostatic hypotension: Secondary | ICD-10-CM

## 2020-04-22 DIAGNOSIS — E0590 Thyrotoxicosis, unspecified without thyrotoxic crisis or storm: Secondary | ICD-10-CM

## 2020-04-22 DIAGNOSIS — Z86718 Personal history of other venous thrombosis and embolism: Secondary | ICD-10-CM

## 2020-04-22 LAB — CBC
HCT: 35.8 % — ABNORMAL LOW (ref 36.0–46.0)
Hemoglobin: 11.9 g/dL — ABNORMAL LOW (ref 12.0–15.0)
MCH: 29.8 pg (ref 26.0–34.0)
MCHC: 33.2 g/dL (ref 30.0–36.0)
MCV: 89.5 fL (ref 80.0–100.0)
Platelets: 229 10*3/uL (ref 150–400)
RBC: 4 MIL/uL (ref 3.87–5.11)
RDW: 12.3 % (ref 11.5–15.5)
WBC: 6.4 10*3/uL (ref 4.0–10.5)
nRBC: 0 % (ref 0.0–0.2)

## 2020-04-22 LAB — BASIC METABOLIC PANEL
Anion gap: 8 (ref 5–15)
BUN: 10 mg/dL (ref 8–23)
CO2: 27 mmol/L (ref 22–32)
Calcium: 8 mg/dL — ABNORMAL LOW (ref 8.9–10.3)
Chloride: 104 mmol/L (ref 98–111)
Creatinine, Ser: 0.65 mg/dL (ref 0.44–1.00)
GFR, Estimated: >60 mL/min (ref >60)
Glucose, Bld: 83 mg/dL (ref 70–99)
Potassium: 3.4 mmol/L — ABNORMAL LOW (ref 3.5–5.1)
Sodium: 139 mmol/L (ref 135–145)

## 2020-04-22 LAB — GLUCOSE, CAPILLARY
Glucose-Capillary: 107 mg/dL — ABNORMAL HIGH (ref 70–99)
Glucose-Capillary: 124 mg/dL — ABNORMAL HIGH (ref 70–99)
Glucose-Capillary: 169 mg/dL — ABNORMAL HIGH (ref 70–99)
Glucose-Capillary: 178 mg/dL — ABNORMAL HIGH (ref 70–99)
Glucose-Capillary: 70 mg/dL (ref 70–99)

## 2020-04-22 MED ORDER — PANTOPRAZOLE SODIUM 40 MG PO TBEC
40.0000 mg | DELAYED_RELEASE_TABLET | Freq: Every day | ORAL | Status: DC
  Administered 2020-04-23 – 2020-04-26 (×4): 40 mg via ORAL
  Filled 2020-04-22 (×4): qty 1

## 2020-04-22 MED ORDER — INSULIN ASPART 100 UNIT/ML ~~LOC~~ SOLN
3.0000 [IU] | Freq: Three times a day (TID) | SUBCUTANEOUS | Status: DC
  Administered 2020-04-22: 3 [IU] via SUBCUTANEOUS

## 2020-04-22 MED ORDER — POTASSIUM CHLORIDE CRYS ER 20 MEQ PO TBCR: 40.0000 meq | EXTENDED_RELEASE_TABLET | Freq: Once | ORAL | Status: DC

## 2020-04-22 MED ORDER — HYDROCODONE-ACETAMINOPHEN 10-325 MG PO TABS
1.0000 | ORAL_TABLET | Freq: Three times a day (TID) | ORAL | Status: DC
Start: 2020-04-22 — End: 2020-04-26
  Administered 2020-04-22 – 2020-04-26 (×14): 1 via ORAL
  Filled 2020-04-22 (×14): qty 1

## 2020-04-22 MED ORDER — INSULIN DETEMIR 100 UNIT/ML ~~LOC~~ SOLN
15.0000 [IU] | Freq: Every day | SUBCUTANEOUS | Status: DC
  Administered 2020-04-22 – 2020-04-25 (×4): 15 [IU] via SUBCUTANEOUS
  Filled 2020-04-22 (×5): qty 0.15

## 2020-04-22 MED ORDER — METOPROLOL SUCCINATE ER 25 MG PO TB24
25.0000 mg | ORAL_TABLET | Freq: Two times a day (BID) | ORAL | Status: DC
  Administered 2020-04-22: 25 mg via ORAL
  Filled 2020-04-22: qty 1

## 2020-04-22 MED ORDER — POTASSIUM CHLORIDE 20 MEQ PO PACK
40.0000 meq | PACK | Freq: Once | ORAL | Status: AC
  Administered 2020-04-22: 40 meq via ORAL
  Filled 2020-04-22: qty 2

## 2020-04-22 NOTE — Progress Notes (Addendum)
PROGRESS NOTE    [name]  XIP:[id number] DOB: 11/06/1945 DOA: 04/18/2020 PCP: [name], MD   Brief Narrative:   Courtney Byland Thorpeis a 75 y.o.femalewith medical history significant forschizophrenia, dyslipidemia, chronic pain, GERD, type 2 diabetes, atrial fibrillation on Eliquis, and chronic iron deficiency anemia who was brought to the ED from her skilled facility due to worsening abdominal pain and mild distention. Patient apparently had nausea and vomiting as well over the last 4 days and it is uncertain whether or not she has been able to keep down her medications.  Patient was admitted with partial SBO as well as atrial fibrillation with RVR.  Heart rate is poorly controlled and cardiology has been consulted to assist.  She has been transitioned to her oral medications.  Diet advanced as tolerated.   Assessment & Plan:   Active Problems:   SBO (small bowel obstruction) (HCC)  Partial SBO-improving -Advanced to soft diet -Hold further IV fluid -Replete as needed for goal potassium of 4 and magnesium of 2 -Appreciate general surgery evaluation and recommendations -Zofran as needed for nausea and vomiting -Hopeful discharge back to high [place] in next 1-2 days  Atrial fibrillation with RVR-uncontrolled HR - Transfer to SDU and restart back on IV cardizem infusion - HR remains uncontrolled despite increased cardizem CD 180 mg and metoprolol XL 25 mg daily.  - will ask for cardiology consult for assistance with controlling HR in setting of soft BPs.  -Likely due to acute illness along with missed doses of Cardizem -Resumed Eliquis for full anticoagulation -Monitor on telemetry  Type 2 diabetes, uncontrolled -Prior hemoglobin A1c 9.2% on 01/2020 -Blood glucose currently stable, reduce levemir to 15 units, add novolog 3 units TID with meals -Maintain on SSI CBG (last 3)  Recent Labs    04/22/20 0437 04/22/20 0723 04/22/20 1117  GLUCAP 70 124* 169*    GERD -PPI daily for GI protection  History of schizophrenia -Resumed home oral medications -Haldol as needed for agitation  Chronic pain -resumed some of her home medications for chronic pain with precautions -IV pain medications only as needed for breakthrough  Iron deficiency anemia-stable -Currently appears stable with no other bleeding -Follow CBC to monitor while on heparin drip -Holdhome iron supplementation -Stool occult negative for bleeding per EDP  Low TSH -Free T4 elevated at 1.52 -Recommend endocrinology follow-up outpatient   DVT prophylaxis: apixaban  Code Status: Full Family Communication:  Tried calling guardian [name] with no response multiple times Disposition Plan:  Status is: Inpatient  Remains inpatient appropriate because:Persistent severe electrolyte disturbances, IV treatments appropriate due to intensity of illness or inability to take PO and Inpatient level of care appropriate due to severity of illness  Dispo: The patient is from:  [facility]  Anticipated d/c is to:  [facility]  Patient currently is not medically stable to d/c.              Difficult to place patient No  Consultants:   General surgery  Procedures:   See below  Antimicrobials:   None  Subjective: Pt c/o back pain at times with certain positions in bed.  She denies chest pains and SOB.     Objective: Vitals:   04/22/20 1100 04/22/20 1115 04/22/20 1125 04/22/20 1130  BP: 127/80   124/64  Pulse: 87 98 (!) 51 (!) 103  Resp: 20 16 (!) 21 12  Temp:   98.4 F (36.9 C)   TempSrc:   Oral  SpO2: 95% 97% 90% 95%  Weight:      Height:        Intake/Output Summary (Last 24 hours) at 04/22/2020 1236 Last data filed at 04/22/2020 0800 Gross per 24 hour  Intake 49.54 ml  Output 700 ml  Net -650.46 ml   Filed Weights   04/18/20 1029 04/21/20 1213  Weight: 57.6 kg 57.8 kg    Examination:  General exam:  elderly frail female with confusion, edentulous.  NAD.  Respiratory system: Clear to auscultation. Respiratory effort normal. Cardiovascular system: tachycardic, irregularly irregular.    Gastrointestinal system:  Soft, nondistended, nontender, no masses palpated.   Central nervous system:  awake, alert, moving all extremities. Extremities: no CCE Skin: No significant lesions noted Psychiatry: Flat affect.  Data Reviewed: I have personally reviewed following labs and imaging studies  CBC: Recent Labs  Lab 04/18/20 1105 04/19/20 0422 04/20/20 0534 04/21/20 0512 04/22/20 0608  WBC 9.5 6.9 7.9 6.2 6.4  NEUTROABS 7.9*  --   --   --   --   HGB 13.8 12.1 11.5* 12.3 11.9*  HCT 39.6 35.1* 34.5* 36.3 35.8*  MCV 86.7 88.2 88.9 88.3 89.5  PLT 250 219 201 223 876   Basic Metabolic Panel: Recent Labs  Lab 04/18/20 1105 04/19/20 0422 04/20/20 0534 04/21/20 0512 04/22/20 0608  NA 134* 137 135 137 139  K 3.0* 3.0* 3.5 3.2* 3.4*  CL 93* 101 105 104 104  CO2 29 20* 20* 23 27  GLUCOSE 97 137* 169* 174* 83  BUN 12 14 11 9 10   CREATININE 0.61 0.60 0.58 0.57 0.65  CALCIUM 8.5* 7.7* 7.6* 7.8* 8.0*  MG  --  1.8 2.0 2.0  --    GFR: Estimated Creatinine Clearance: 53.3 mL/min (by C-G formula based on SCr of 0.65 mg/dL). Liver Function Tests: Recent Labs  Lab 04/18/20 1105 04/19/20 0422 04/20/20 0534 04/21/20 0512  AST 18 15 17 17   ALT 13 11 13 13   ALKPHOS 53 46 47 48  BILITOT 0.7 0.9 0.8 1.0  PROT 6.7 5.9* 5.8* 5.9*  ALBUMIN 3.4* 2.9* 2.9* 2.8*   Recent Labs  Lab 04/18/20 1105  LIPASE 19   No results for input(s): AMMONIA in the last 168 hours. Coagulation Profile: No results for input(s): INR, PROTIME in the last 168 hours. Cardiac Enzymes: No results for input(s): CKTOTAL, CKMB, CKMBINDEX, TROPONINI in the last 168 hours. BNP (last 3 results) No results for input(s): PROBNP in the last 8760 hours. HbA1C: No results for input(s): HGBA1C in the last 72  hours. CBG: Recent Labs  Lab 04/21/20 2024 04/22/20 0026 04/22/20 0437 04/22/20 0723 04/22/20 1117  GLUCAP 171* 107* 70 124* 169*   Lipid Profile: No results for input(s): CHOL, HDL, LDLCALC, TRIG, CHOLHDL, LDLDIRECT in the last 72 hours. Thyroid Function Tests: No results for input(s): TSH, T4TOTAL, FREET4, T3FREE, THYROIDAB in the last 72 hours. Anemia Panel: No results for input(s): VITAMINB12, FOLATE, FERRITIN, TIBC, IRON, RETICCTPCT in the last 72 hours. Sepsis Labs: No results for input(s): PROCALCITON, LATICACIDVEN in the last 168 hours.  Recent Results (from the past 240 hour(s))  Resp Panel by RT-PCR (Flu A&B, Covid) Nasopharyngeal Swab     Status: None   Collection Time: 04/18/20  1:30 PM   Specimen: Nasopharyngeal Swab; Nasopharyngeal(NP) swabs in vial transport medium  Result Value Ref Range Status   SARS Coronavirus 2 by RT PCR NEGATIVE NEGATIVE Final    Comment: (NOTE) SARS-CoV-2 target nucleic acids are NOT DETECTED.  The SARS-CoV-2 RNA is generally detectable in upper respiratory specimens during the acute phase of infection. The lowest concentration of SARS-CoV-2 viral copies this assay can detect is 138 copies/mL. A negative result does not preclude SARS-Cov-2 infection and should not be used as the sole basis for treatment or other patient management decisions. A negative result may occur with  improper specimen collection/handling, submission of specimen other than nasopharyngeal swab, presence of viral mutation(s) within the areas targeted by this assay, and inadequate number of viral copies(<138 copies/mL). A negative result must be combined with clinical observations, patient history, and epidemiological information. The expected result is Negative.  Fact Sheet for Patients:  EntrepreneurPulse.com.au  Fact Sheet for Healthcare Providers:  IncredibleEmployment.be  This test is no t yet approved or cleared by the  [place] FDA and  has been authorized for detection and/or diagnosis of SARS-CoV-2 by FDA under an Emergency Use Authorization (EUA). This EUA will remain  in effect (meaning this test can be used) for the duration of the COVID-19 declaration under Section 564(b)(1) of the Act, 21 U.S.C.section 360bbb-3(b)(1), unless the authorization is terminated  or revoked sooner.       Influenza A by PCR NEGATIVE NEGATIVE Final   Influenza B by PCR NEGATIVE NEGATIVE Final    Comment: (NOTE) The Xpert Xpress SARS-CoV-2/FLU/RSV plus assay is intended as an aid in the diagnosis of influenza from Nasopharyngeal swab specimens and should not be used as a sole basis for treatment. Nasal washings and aspirates are unacceptable for Xpert Xpress SARS-CoV-2/FLU/RSV testing.  Fact Sheet for Patients: EntrepreneurPulse.com.au  Fact Sheet for Healthcare Providers: IncredibleEmployment.be  This test is not yet approved or cleared by the [place] FDA and has been authorized for detection and/or diagnosis of SARS-CoV-2 by FDA under an Emergency Use Authorization (EUA). This EUA will remain in effect (meaning this test can be used) for the duration of the COVID-19 declaration under Section 564(b)(1) of the Act, 21 U.S.C. section 360bbb-3(b)(1), unless the authorization is terminated or revoked.  Performed at [facility], [address], [place], [place] [address]   MRSA PCR Screening     Status: None   Collection Time: 04/18/20  8:47 PM   Specimen: Nasopharyngeal  Result Value Ref Range Status   MRSA by PCR NEGATIVE NEGATIVE Final    Comment:        The GeneXpert MRSA Assay (FDA approved for NASAL specimens only), is one component of a comprehensive MRSA colonization surveillance program. It is not intended to diagnose MRSA infection nor to guide or monitor treatment for MRSA infections. Performed at [facility], [address], [place], [place]  [address]    Radiology Studies: No results found.  Scheduled Meds: . apixaban  5 mg Oral BID  . ARIPiprazole  2 mg Oral QHS  . atorvastatin  40 mg Oral QHS  . bisacodyl  10 mg Rectal BID AC & HS  . Chlorhexidine Gluconate Cloth  6 each Topical Daily  . diltiazem  180 mg Oral Daily  . DULoxetine  60 mg Oral Daily  . HYDROcodone-acetaminophen  1 tablet Oral TID  . insulin aspart  0-9 Units Subcutaneous Q4H  . insulin detemir  20 Units Subcutaneous QHS  . latanoprost  1 drop Both Eyes QHS  . metoprolol succinate  25 mg Oral BID  . mirabegron ER  25 mg Oral Daily  . mirtazapine  30 mg Oral QHS  . pantoprazole (PROTONIX) IV  40 mg Intravenous Q24H  . polyethylene  glycol  17 g Oral BID  . potassium chloride  40 mEq Oral Once  . risperiDONE  1 mg Oral BID   Continuous Infusions: . diltiazem (CARDIZEM) infusion 10 mg/hr (04/22/20 1011)     LOS: 4 days   Time spent: 35 minutes  Adalis Gatti Wynetta Emery, MD How to contact the [facility] Attending or Consulting provider [place] or covering provider during after hours [place], for this patient?  1. Check the care team in [facility] and look for a) attending/consulting TRH provider listed and b) the [facility] team listed 2. Log into [email or web address] and use [place]'s universal password to access. If you do not have the password, please contact the hospital operator. 3. Locate the [facility] provider you are looking for under [facility] and page to a number that you can be directly reached. 4. If you still have difficulty reaching the provider, please page the [facility] (Director on Call) for the Hospitalists listed on amion for assistance.

## 2020-04-22 NOTE — Consult Note (Addendum)
Cardiology Consultation:   Patient ID: [name] MRN: [id number]; DOB: 1945-02-24  Admit date: 04/18/2020 Date of Consult: 04/22/2020  PCP:  [name], [place]  Cardiologist:  Rozann Lesches, MD   Patient Profile:   [name] is a 75 y.o. female with past medical history of paroxysmal atrial fibrillation (diagnosed during admission in 09/2019), HTN, HLD, IDDM, SVT, history of DVT and history of CVA who is being seen today for the evaluation of atrial fibrillation with RVR at the request of Dr. Wynetta Emery.  History of Present Illness:   Ms. Peppel was admitted to [facility] in 09/2019 and diagnosed with new-onset atrial fibrillation that admission and converted to NSR with IV Cardizem. Was continued on Coumadin for anticoagulation and Toprol-XL 25mg  BID for rate-control. Given her history of orthostatic hypotension, Midodrine was also titrated to 5mg  TID.   She did follow-up with Katina Dung, NP in 09/2019 and was in atrial flutter with HR of 105 and was started on Cardizem CD 120mg  daily with Toprol-XL reduced to 25mg  daily. Did follow-up again on 10/2019 and BP was soft at 88/52, therefore Midodrine was titrated to 10mg  TID (had previously been reduced to BID at prior visits).   Was again admitted in 01/2020 to [facility] for chest pain rule-out and enzymes were negative and symptoms were felt to be most consistent with a GI etiology. Was transitioned from Coumadin to Eliquis during admission while being continued on Cardizem and Toprol-XL.   Presented back to [facility] ED on 04/18/2020 from [facility] ALF for evaluation of nausea and vomiting. Was found to have an SBO at the time of admission and also to be in atrial fibrillation with RVR and required IV Cardizem. General Surgery has been following and her SBO continues to improve with no plans for surgical intervention at this time. On 04/21/2020, her HR was noted to be significantly elevated in  the 140's to 150's, therefore she was transferred to the ICU for re initiation of IV Cardizem. Rates were well-controlled in the 70's to 80's overnight but have been in the 110's to 150's this morning. IV Cardizem was recently titrated to 10 mg/hr and PO Cardizem was increased from 120mg  daily to 180mg  daily along with receiving Toprol-XL 25mg  daily. BP has been soft but she received doses of Norco, Dilaudid and Klonopin as well. She denies any chest pain or palpitations but reports still having some abdominal discomfort and associated nausea. Also reports chronic back pain.    Past Medical History:  Diagnosis Date  . Anxiety   . Arthritis   . COVID-19 virus infection    COVID-19+ approx 01/24/19; asymptomatic course with full recovery  . Dependence on wheelchair    pivot/transfers  . Depression    History of psychosis and previous suicide attempt  . DVT, lower extremity, recurrent (HCC)    Long-term Coumadin per Dr. Legrand Rams  . Essential hypertension   . GERD (gastroesophageal reflux disease)   . Hemiplegia ([place]) 2010   Left side  . History of stroke    Acute infarct and right cerebral white matter small vessel disease 12/10  . Leg DVT (deep venous thromboembolism), acute ([place]) 2006  . Schizophrenia ([place])   . Stroke [facility])    left sided weakness  . Type 2 diabetes mellitus ([place])     Past Surgical History:  Procedure Laterality Date  . BACK SURGERY    . BIOPSY N/A 11/24/2014   Procedure:  BIOPSY;  Surgeon: Danie Binder, MD;  Location: [facility] ORS;  Service: Endoscopy;  Laterality: N/A;  . BIOPSY  05/17/2016   Procedure: BIOPSY;  Surgeon: Danie Binder, MD;  Location: [facility] ENDO SUITE;  Service: Endoscopy;;  gastric biopsy  . COLONOSCOPY WITH PROPOFOL N/A 11/24/2014   Dr. Rudie Meyer polyps removed/moderate sized internal hemorrhoids, tubular adenomas. Next surveillance in 3 years  . ESOPHAGOGASTRODUODENOSCOPY (EGD) WITH PROPOFOL N/A 11/24/2014   Dr. Clayburn Pert HH/patent stricture at the  gastroesophageal junction, mild non-erosive gastritis, path negative for H.pylori or celiac sprue  . ESOPHAGOGASTRODUODENOSCOPY (EGD) WITH PROPOFOL N/A 05/17/2016   Procedure: ESOPHAGOGASTRODUODENOSCOPY (EGD) WITH PROPOFOL;  Surgeon: Danie Binder, MD;  Location: [facility] ENDO SUITE;  Service: Endoscopy;  Laterality: N/A;  12:45pm  . GIVENS CAPSULE STUDY N/A 12/11/2014   MULTILPLE EROSION IN the stomach WITH ACTIVE OOZING. OCCASIONAL EROSIONS AND RARE ULCER SEEN IN PROXIMAL SMALL BOWEL . No masses or AVMs SEEN. NO OLD BLOOD OR FRESH BLOOD SEEN.   . POLYPECTOMY N/A 11/24/2014   Procedure: POLYPECTOMY;  Surgeon: Danie Binder, MD;  Location: [facility] ORS;  Service: Endoscopy;  Laterality: N/A;  . SAVORY DILATION N/A 05/17/2016   Procedure: SAVORY DILATION;  Surgeon: Danie Binder, MD;  Location: [facility] ENDO SUITE;  Service: Endoscopy;  Laterality: N/A;     Home Medications:  Prior to Admission medications   Medication Sig Start Date End Date Taking? Authorizing Provider  ABILIFY 2 MG tablet Take 1 tablet (2 mg total) by mouth at bedtime. 04/26/20 09/23/20 Yes [name], MD  acetaminophen (TYLENOL) 325 MG tablet Take 650 mg by mouth 3 (three) times daily as needed for mild pain or moderate pain.    Yes [provider]  apixaban (ELIQUIS) 5 MG TABS tablet Take 1 tablet (5 mg total) by mouth 2 (two) times daily. 02/21/20  Yes Anderson, Chelsey L, DO  atorvastatin (LIPITOR) 40 MG tablet Take 1 tablet (40 mg total) by mouth at bedtime. 02/21/20  Yes Anderson, Chelsey L, DO  calcium carbonate (TUMS - DOSED IN MG ELEMENTAL CALCIUM) 500 MG chewable tablet Chew 1 tablet by mouth 4 (four) times daily as needed for heartburn.   Yes [provider]  Carboxymethylcellulose Sodium (ARTIFICIAL TEARS OP) Place 1 drop into both eyes 3 (three) times daily.   Yes [provider]  Cholecalciferol (VITAMIN D3) 50 MCG (2000 UT) TABS Take 2,000 Units by mouth daily.   Yes [provider]  clonazePAM  (KLONOPIN) 0.5 MG tablet Take 1 tablet (0.5 mg total) by mouth 3 (three) times daily as needed for anxiety. 02/21/20  Yes Anderson, Chelsey L, DO  diltiazem (CARDIZEM CD) 120 MG 24 hr capsule Take 1 capsule (120 mg total) by mouth daily. 12/23/19  Yes Verta Ellen., NP  DULoxetine (CYMBALTA) 60 MG capsule Take 60 mg by mouth daily.   Yes [provider]  FERREX 150 150 MG capsule Take 150 mg by mouth daily.   Yes [provider]  gabapentin (NEURONTIN) 300 MG capsule Take 1 capsule (300 mg total) by mouth 3 (three) times daily as needed. 02/21/20  Yes Anderson, Chelsey L, DO  HYDROcodone-acetaminophen (NORCO) 10-325 MG tablet Take 1 tablet by mouth 3 (three) times daily as needed. 02/21/20  Yes Anderson, Chelsey L, DO  hydrocortisone (ANUSOL-HC) 2.5 % rectal cream Place 1 application rectally 2 (two) times daily as needed (for rectal bleeding).   Yes [provider]  hydrOXYzine (ATARAX/VISTARIL) 25 MG tablet Take 25 mg by  mouth 3 (three) times daily. 04/08/20  Yes [provider]  insulin detemir (LEVEMIR FLEXTOUCH) 100 UNIT/ML FlexPen Inject 38 Units into the skin at bedtime. Patient taking differently: Inject 42 Units into the skin at bedtime. 11/06/19  Yes Nida, Marella Chimes, MD  latanoprost (XALATAN) 0.005 % ophthalmic solution Place 1 drop into both eyes at bedtime.   Yes [provider]  loratadine (CLARITIN) 10 MG tablet Take 10 mg by mouth daily.   Yes [provider]  lubiprostone (AMITIZA) 24 MCG capsule Take 1 capsule (24 mcg total) by mouth 2 (two) times daily with a meal. 07/02/19  Yes Aliene Altes S, PA-C  metoprolol succinate (TOPROL-XL) 25 MG 24 hr tablet Take 1 tablet (25 mg total) by mouth daily. 11/27/19  Yes Verta Ellen., NP  midodrine (PROAMATINE) 10 MG tablet TAKE (1) TABLET BY MOUTH (3) TIMES DAILY. Patient taking differently: Take 10 mg by mouth 3 (three) times daily. 02/09/20  Yes Verta Ellen., NP   mirabegron ER (MYRBETRIQ) 25 MG TB24 tablet Take 25 mg by mouth daily.   Yes [provider]  mirtazapine (REMERON) 30 MG tablet Take 30 mg by mouth at bedtime.   Yes [provider]  ondansetron (ZOFRAN) 4 MG tablet Take 1 tablet (4 mg total) by mouth 4 (four) times daily -  before meals and at bedtime. Patient taking differently: Take 4 mg by mouth 2 (two) times daily as needed for nausea or vomiting. 04/20/16  Yes Annitta Needs, NP  pantoprazole (PROTONIX) 40 MG tablet Take 1 tablet (40 mg total) by mouth daily. 02/21/20  Yes Anderson, Chelsey L, DO  polyethylene glycol (MIRALAX / GLYCOLAX) 17 g packet Take 17 g by mouth daily. 02/22/20  Yes Anderson, Chelsey L, DO  risperiDONE (RISPERDAL) 1 MG tablet Take 1 tablet (1 mg total) by mouth in the morning and at bedtime. 04/26/20 09/23/20 Yes [name], MD  senna (SENOKOT) 8.6 MG TABS tablet Take 1 tablet (8.6 mg total) by mouth daily. 02/22/20  Yes Anderson, Chelsey L, DO  simethicone (MYLICON) 80 MG chewable tablet Chew 80 mg by mouth 3 (three) times daily. 03/25/20  Yes [provider]  tiZANidine (ZANAFLEX) 2 MG tablet Take 1 tablet (2 mg total) by mouth daily as needed for muscle spasms. 02/21/20  Yes Anderson, Chelsey L, DO  TRADJENTA 5 MG TABS tablet TAKE (1) TABLET BY MOUTH ONCE DAILY. Patient taking differently: Take 5 mg by mouth daily. 02/09/20  Yes Nida, Marella Chimes, MD  zolpidem (AMBIEN) 10 MG tablet Take 10 mg by mouth at bedtime as needed for sleep.   Yes [provider]  simvastatin (ZOCOR) 20 MG tablet Take 20 mg by mouth daily.    [provider]    Inpatient Medications: Scheduled Meds: . apixaban  5 mg Oral BID  . ARIPiprazole  2 mg Oral QHS  . atorvastatin  40 mg Oral QHS  . bisacodyl  10 mg Rectal BID AC & HS  . Chlorhexidine Gluconate Cloth  6 each Topical Daily  . diltiazem  180 mg Oral Daily  . DULoxetine  60 mg Oral Daily  . HYDROcodone-acetaminophen  1 tablet Oral TID  .  insulin aspart  0-9 Units Subcutaneous Q4H  . insulin detemir  20 Units Subcutaneous QHS  . latanoprost  1 drop Both Eyes QHS  . metoprolol succinate  25 mg Oral BID  . mirabegron ER  25 mg Oral Daily  . mirtazapine  30  mg Oral QHS  . pantoprazole (PROTONIX) IV  40 mg Intravenous Q24H  . polyethylene glycol  17 g Oral BID  . potassium chloride  40 mEq Oral Once  . risperiDONE  1 mg Oral BID   Continuous Infusions: . diltiazem (CARDIZEM) infusion 10 mg/hr (04/22/20 1011)   PRN Meds: acetaminophen **OR** acetaminophen, clonazePAM, haloperidol lactate, HYDROmorphone (DILAUDID) injection, labetalol, milk and molasses, naphazoline-glycerin, ondansetron **OR** ondansetron (ZOFRAN) IV, prochlorperazine  Allergies:    Allergies  Allergen Reactions  . Sulfa Antibiotics Rash    Social History:   Social History   Socioeconomic History  . Marital status: Widowed    Spouse name: Not on file  . Number of children: Not on file  . Years of education: Not on file  . Highest education level: Not on file  Occupational History  . Not on file  Tobacco Use  . Smoking status: Former Smoker    Packs/day: 0.25    Years: 20.00    Pack years: 5.00    Types: Cigarettes    Quit date: 01/24/1995    Years since quitting: 25.2  . Smokeless tobacco: Never Used  Vaping Use  . Vaping Use: Never used  Substance and Sexual Activity  . Alcohol use: No    Alcohol/week: 0.0 standard drinks  . Drug use: No  . Sexual activity: Never    Birth control/protection: Post-menopausal  Other Topics Concern  . Not on file  Social History Narrative  . Not on file   Social Determinants of Health   Financial Resource Strain: Not on file  Food Insecurity: Not on file  Transportation Needs: Not on file  Physical Activity: Not on file  Stress: Not on file  Social Connections: Not on file  Intimate Partner Violence: Not on file    Family History:    Family History  Problem Relation Age of Onset  .  Hypertension Mother   . Colon cancer Neg Hx      ROS:  Please see the history of present illness.   All other ROS reviewed and negative.     Physical Exam/Data:   Vitals:   04/22/20 0130 04/22/20 0200 04/22/20 0230 04/22/20 0754  BP: (!) 94/53 (!) 108/51 (!) 100/54   Pulse: 77 76 (!) 52 73  Resp: (!) 25 17 (!) 21 (!) 29  Temp:    97.8 F (36.6 C)  TempSrc:    Oral  SpO2: 98% 95% 96% 95%  Weight:      Height:        Intake/Output Summary (Last 24 hours) at 04/22/2020 1045 Last data filed at 04/22/2020 0040 Gross per 24 hour  Intake 49.54 ml  Output --  Net 49.54 ml   Last 3 Weights 04/21/2020 04/18/2020 11/26/2019  Weight (lbs) 127 lb 6.8 oz 127 lb 127 lb 3.2 oz  Weight (kg) 57.8 kg 57.607 kg 57.698 kg  Some encounter information is confidential and restricted. Go to Review Flowsheets activity to see all data.     Body mass index is 21.87 kg/m.  General:  Thin elderly female, currently in no acute distress.  HEENT: normal Lymph: no adenopathy Neck: no JVD Endocrine:  No thryomegaly Vascular: No carotid bruits; FA pulses 2+ bilaterally without bruits  Cardiac:  Irregularly irregular;  no murmur Lungs:  clear to auscultation bilaterally, no wheezing, rhonchi or rales  Abd: soft, nontender, no hepatomegaly  Ext: no edema Musculoskeletal:  No deformities, BUE and BLE strength normal and equal Skin: warm and dry  Neuro:  CNs 2-12 intact, no focal abnormalities noted Psych:  Normal affect   EKG:  The EKG was personally reviewed and demonstrates: Atrial fibrillation with RVR, HR 128 with nonspecific IVCD.   Telemetry:  Telemetry was personally reviewed and demonstrates: Atrial fibrillation, HR in 80's to 150's. Pauses up to 2.3 seconds.   Relevant CV Studies:  NST: 09/2019  There was no ST segment deviation noted during stress. In and out of afib during study,  The study is normal. Inferior defect likely due to subdiaphragmatic attenuation, cannot completely exclude  very mild inferior ischemia. Either findign would support low risk.  This is a low risk study.  The left ventricular ejection fraction is normal (55-65%).     Echocardiogram: 09/2019 IMPRESSIONS    1. Left ventricular ejection fraction, by estimation, is 60 to 65%. The  left ventricle has normal function. The left ventricle has no regional  wall motion abnormalities. There is moderate concentric left ventricular  hypertrophy and severe basal septal  hypertrophy .  2. Right ventricular systolic function is normal. The right ventricular  size is normal. Tricuspid regurgitation signal is inadequate for assessing  PA pressure.  3. Left atrial size was mildly dilated.  4. The mitral valve is normal in structure. Mild mitral valve  regurgitation. No evidence of mitral stenosis. Moderate mitral annular  calcification.  5. The aortic valve is tricuspid. Aortic valve regurgitation is not  visualized. Mild to moderate aortic valve sclerosis/calcification is  present, without any evidence of aortic stenosis.  6. The inferior vena cava is normal in size with greater than 50%  respiratory variability, suggesting right atrial pressure of 3 mmHg.   Laboratory Data:  High Sensitivity Troponin:  No results for input(s): TROPONINIHS in the last 720 hours.   Chemistry Recent Labs  Lab 04/20/20 0534 04/21/20 0512 04/22/20 0608  NA 135 137 139  K 3.5 3.2* 3.4*  CL 105 104 104  CO2 20* 23 27  GLUCOSE 169* 174* 83  BUN 11 9 10   CREATININE 0.58 0.57 0.65  CALCIUM 7.6* 7.8* 8.0*  GFRNONAA >60 >60 >60  ANIONGAP 10 10 8     Recent Labs  Lab 04/19/20 0422 04/20/20 0534 04/21/20 0512  PROT 5.9* 5.8* 5.9*  ALBUMIN 2.9* 2.9* 2.8*  AST 15 17 17   ALT 11 13 13   ALKPHOS 46 47 48  BILITOT 0.9 0.8 1.0   Hematology Recent Labs  Lab 04/20/20 0534 04/21/20 0512 04/22/20 0608  WBC 7.9 6.2 6.4  RBC 3.88 4.11 4.00  HGB 11.5* 12.3 11.9*  HCT 34.5* 36.3 35.8*  MCV 88.9 88.3 89.5  MCH  29.6 29.9 29.8  MCHC 33.3 33.9 33.2  RDW 12.6 12.5 12.3  PLT 201 223 229   BNPNo results for input(s): BNP, PROBNP in the last 168 hours.  DDimer No results for input(s): DDIMER in the last 168 hours.   Radiology/Studies:  CT Abdomen Pelvis W Contrast  Result Date: 04/18/2020 CLINICAL DATA:  Nausea, vomiting, abdominal pain EXAM: CT ABDOMEN AND PELVIS WITH CONTRAST TECHNIQUE: Multidetector CT imaging of the abdomen and pelvis was performed using the standard protocol following bolus administration of intravenous contrast. CONTRAST:  182mL OMNIPAQUE IOHEXOL 300 MG/ML  SOLN COMPARISON:  03/28/2017 FINDINGS: Lower chest: No acute abnormality. Hepatobiliary: No solid liver abnormality is seen. Definitively benign subcapsular hemangioma of the right lobe of the liver, unchanged (series 2, image 21). No gallstones, gallbladder wall thickening, or biliary dilatation. Pancreas: Unremarkable. No pancreatic ductal dilatation or surrounding inflammatory  changes. Spleen: Normal in size without significant abnormality. Adrenals/Urinary Tract: Adrenal glands are unremarkable. Kidneys are normal, without renal calculi, solid lesion, or hydronephrosis. Bladder is unremarkable. Stomach/Bowel: The stomach is fluid filled and the proximal small bowel is mildly distended, largest loops measuring up to 3.8 cm. There is an abrupt caliber change of the mid to distal small bowel in the midline ventral abdomen (series 2, image 53, series 5, image 43), with relatively decompressed, although still gas and fluid containing distal small bowel. Appendix appears normal. Large burden of stool and stool balls throughout the colon with numerous large stool balls in the rectum. Vascular/Lymphatic: Aortic atherosclerosis. No enlarged abdominal or pelvic lymph nodes. Reproductive: No mass or other significant abnormality. Other: No abdominal wall hernia or abnormality. Small volume ascites throughout the abdomen. Musculoskeletal: No acute  or significant osseous findings. IMPRESSION: 1. The stomach is fluid filled and the proximal small bowel is mildly distended. There is an abrupt caliber change of the mid to distal small bowel in the midline ventral abdomen, with relatively decompressed, although still gas and fluid containing distal small bowel. Findings are concerning for partial or developing small bowel obstruction. 2. Large burden of stool and stool balls throughout the colon with numerous large stool balls in the rectum. Correlate for fecal impaction. 3. Small volume ascites. Electronically Signed   By: Eddie Candle M.D.   On: 04/18/2020 12:23     Assessment and Plan:   1. Atrial Fibrillation with RVR - She has a known history of paroxysmal atrial fibrillation but rates have been elevated this admission, likely secondary to her acute illness. Mg 2.0 as of 3/30 but K+ slightly low at 3.4 this AM. Will order replacement and try to keep K+ ~ 4.0. TSH low at 0.293 with Free T4 elevated to 1.52 and will therefore need follow-up with Endocrinology as an outpatient.  - Cardizem was just titrated to 180mg  daily and she remains on IV Cardizem as well. Will titrate Toprol-XL to her prior BID dosing with hold parameters in place for her BP. Hopefully rates will continue to improve as her nausea and abdominal pain resolve. May need to consider re initiation of Midodrine as outlined below as she was on this prior to admission.  - Hgb stable at 11.9 and platelets 229 K. Continue Eliquis 5mg  BID for anticoagulation.   2. HTN complicated by Orthostatic Hypotension - BP has been variable from 82/44 - 140/56 within the past 24 hours but she has been receiving pain medications as outlined above. If hypotension continues to be an issue, would recommend restarting her PTA Midodrine as she was on Midodrine 10mg  TID prior to admission.   3. History of DVT - She was previously on Coumadin but switched to Eliquis earlier this year. Was bridged with  Heparin initially this admission but has now been restarted on Eliquis 5mg  BID.   4. Partial SBO/Nausea - General Surgery has been following but her symptoms have improved with conservative measures thus far. Further management per admitting team.    Risk Assessment/Risk Scores:        CHA2DS2-VASc Score = 7  This indicates a 11.2% annual risk of stroke. The patient's score is based upon: CHF History: No HTN History: Yes Diabetes History: Yes Stroke History: Yes Vascular Disease History: Yes Age Score: 1 Gender Score: 1    For questions or updates, please contact [place] Please consult [email or web address] for contact info under    Signed, Erma Heritage, PA-C  04/22/2020 10:45 AM   Attending note:  Patient seen and examined.  Case discussed with Ms. Ahmed Prima PA-C, I agree with her above findings.  Ms. Westerman is currently admitted to the hospital with partial SBO associated with nausea and abdominal discomfort.  In this setting she has had atrial fibrillation with RVR with known history at baseline.  Cardizem CD has been titrated to 180 mg daily and she has had overlapping intravenous Cardizem for additional heart rate control in the short-term.  She is on Eliquis for stroke prophylaxis.  Also with history of orthostatic hypotension and previously on midodrine, this has not yet been resumed.  On examination she is in no distress.  Complains of abdominal and back discomfort.  She is afebrile.  Heart rate 100-120 range in atrial fibrillation by telemetry which I personally reviewed.  Systolic blood pressure currently in the 100-120 range.  Lungs are clear.  Good exam with irregularly irregular rhythm and no gallop.  2/6 systolic murmur.  Pertinent lab work includes potassium 3.4, creatinine 0.65, hemoglobin 11.9, platelets 229.  I personally reviewed her ECG from March 27 showing atrial fibrillation with RVR, nonspecific ST-T changes.  Atrial fibrillation with RVR in the  setting of partial SBO.  Agree with increase in Cardizem CD to180 mg daily, uptitrate Toprol-XL back to prior twice daily dosing as an outpatient.  Continue Eliquis.  Hopefully she can be weaned off intravenous Cardizem in the interim.  If blood pressure limits titration of AV nodal blockers, midodrine can be resumed as well which she had been on previously.  Satira Sark, M.D., F.A.C.C.

## 2020-04-23 ENCOUNTER — Encounter (HOSPITAL_COMMUNITY): Payer: Self-pay | Admitting: Internal Medicine

## 2020-04-23 DIAGNOSIS — I4819 Other persistent atrial fibrillation: Secondary | ICD-10-CM

## 2020-04-23 DIAGNOSIS — E876 Hypokalemia: Secondary | ICD-10-CM | POA: Diagnosis not present

## 2020-04-23 DIAGNOSIS — I4891 Unspecified atrial fibrillation: Secondary | ICD-10-CM | POA: Diagnosis not present

## 2020-04-23 DIAGNOSIS — I10 Essential (primary) hypertension: Secondary | ICD-10-CM | POA: Diagnosis not present

## 2020-04-23 DIAGNOSIS — K56609 Unspecified intestinal obstruction, unspecified as to partial versus complete obstruction: Secondary | ICD-10-CM | POA: Diagnosis not present

## 2020-04-23 LAB — BASIC METABOLIC PANEL
Anion gap: 7 (ref 5–15)
BUN: 7 mg/dL — ABNORMAL LOW (ref 8–23)
CO2: 29 mmol/L (ref 22–32)
Calcium: 8.3 mg/dL — ABNORMAL LOW (ref 8.9–10.3)
Chloride: 103 mmol/L (ref 98–111)
Creatinine, Ser: 0.73 mg/dL (ref 0.44–1.00)
GFR, Estimated: >60 mL/min (ref >60)
Glucose, Bld: 114 mg/dL — ABNORMAL HIGH (ref 70–99)
Potassium: 3.9 mmol/L (ref 3.5–5.1)
Sodium: 139 mmol/L (ref 135–145)

## 2020-04-23 LAB — GLUCOSE, CAPILLARY
Glucose-Capillary: 119 mg/dL — ABNORMAL HIGH (ref 70–99)
Glucose-Capillary: 122 mg/dL — ABNORMAL HIGH (ref 70–99)
Glucose-Capillary: 125 mg/dL — ABNORMAL HIGH (ref 70–99)
Glucose-Capillary: 136 mg/dL — ABNORMAL HIGH (ref 70–99)
Glucose-Capillary: 187 mg/dL — ABNORMAL HIGH (ref 70–99)
Glucose-Capillary: 196 mg/dL — ABNORMAL HIGH (ref 70–99)

## 2020-04-23 LAB — CBC
HCT: 39.2 % (ref 36.0–46.0)
Hemoglobin: 13 g/dL (ref 12.0–15.0)
MCH: 29.8 pg (ref 26.0–34.0)
MCHC: 33.2 g/dL (ref 30.0–36.0)
MCV: 89.9 fL (ref 80.0–100.0)
Platelets: 262 10*3/uL (ref 150–400)
RBC: 4.36 MIL/uL (ref 3.87–5.11)
RDW: 12.3 % (ref 11.5–15.5)
WBC: 6.6 10*3/uL (ref 4.0–10.5)
nRBC: 0 % (ref 0.0–0.2)

## 2020-04-23 LAB — MAGNESIUM: Magnesium: 2 mg/dL (ref 1.7–2.4)

## 2020-04-23 MED ORDER — METOPROLOL SUCCINATE ER 50 MG PO TB24
50.0000 mg | ORAL_TABLET | Freq: Two times a day (BID) | ORAL | Status: DC
  Administered 2020-04-23 – 2020-04-26 (×7): 50 mg via ORAL
  Filled 2020-04-23 (×7): qty 1

## 2020-04-23 MED ORDER — MIDODRINE HCL 5 MG PO TABS
5.0000 mg | ORAL_TABLET | Freq: Three times a day (TID) | ORAL | Status: DC
  Administered 2020-04-23 – 2020-04-26 (×8): 5 mg via ORAL
  Filled 2020-04-23 (×8): qty 1

## 2020-04-23 MED ORDER — POTASSIUM CHLORIDE CRYS ER 10 MEQ PO TBCR
10.0000 meq | EXTENDED_RELEASE_TABLET | Freq: Every day | ORAL | Status: DC
  Administered 2020-04-23 – 2020-04-26 (×4): 10 meq via ORAL
  Filled 2020-04-23 (×4): qty 1

## 2020-04-23 MED ORDER — INSULIN ASPART 100 UNIT/ML ~~LOC~~ SOLN
0.0000 [IU] | Freq: Three times a day (TID) | SUBCUTANEOUS | Status: DC
  Administered 2020-04-23 (×2): 1 [IU] via SUBCUTANEOUS
  Administered 2020-04-24 – 2020-04-25 (×5): 2 [IU] via SUBCUTANEOUS
  Administered 2020-04-25 – 2020-04-26 (×2): 3 [IU] via SUBCUTANEOUS
  Administered 2020-04-26: 2 [IU] via SUBCUTANEOUS

## 2020-04-23 MED ORDER — SENNA 8.6 MG PO TABS
1.0000 | ORAL_TABLET | Freq: Every day | ORAL | Status: DC
  Administered 2020-04-23 – 2020-04-25 (×3): 8.6 mg via ORAL
  Filled 2020-04-23 (×3): qty 1

## 2020-04-23 NOTE — Progress Notes (Signed)
Progress Note  Patient Name: [name] Date of Encounter: 04/23/2020  Primary Cardiologist: Rozann Lesches, MD  Subjective   Complains of back pain.  Limited appetite.  No chest pain or palpitations.  Inpatient Medications    Scheduled Meds: . apixaban  5 mg Oral BID  . ARIPiprazole  2 mg Oral QHS  . atorvastatin  40 mg Oral QHS  . bisacodyl  10 mg Rectal BID AC & HS  . Chlorhexidine Gluconate Cloth  6 each Topical Daily  . diltiazem  180 mg Oral Daily  . DULoxetine  60 mg Oral Daily  . HYDROcodone-acetaminophen  1 tablet Oral TID  . insulin aspart  0-9 Units Subcutaneous Q4H  . insulin aspart  3 Units Subcutaneous TID WC  . insulin detemir  15 Units Subcutaneous QHS  . latanoprost  1 drop Both Eyes QHS  . metoprolol succinate  25 mg Oral BID  . mirabegron ER  25 mg Oral Daily  . mirtazapine  30 mg Oral QHS  . pantoprazole  40 mg Oral Q0600  . polyethylene glycol  17 g Oral BID  . risperiDONE  1 mg Oral BID   Continuous Infusions: . diltiazem (CARDIZEM) infusion Stopped (04/22/20 1300)   PRN Meds: acetaminophen **OR** acetaminophen, clonazePAM, haloperidol lactate, HYDROmorphone (DILAUDID) injection, labetalol, milk and molasses, naphazoline-glycerin, ondansetron **OR** ondansetron (ZOFRAN) IV, prochlorperazine   Vital Signs    Vitals:   04/23/20 0330 04/23/20 0400 04/23/20 0500 04/23/20 0747  BP: (!) 129/51 (!) 141/49    Pulse: 68 65    Resp: 11 (!) 23    Temp:  99 F (37.2 C)  98.2 F (36.8 C)  TempSrc:  Oral  Oral  SpO2: 99% 99%    Weight:   57.8 kg   Height:        Intake/Output Summary (Last 24 hours) at 04/23/2020 0843 Last data filed at 04/23/2020 0500 Gross per 24 hour  Intake 53.76 ml  Output 2275 ml  Net -2221.24 ml   Filed Weights   04/18/20 1029 04/21/20 1213 04/23/20 0500  Weight: 57.6 kg 57.8 kg 57.8 kg    Telemetry    Atrial fibrillation with RVR.  Personally reviewed.  ECG    No ECG reviewed.  Physical Exam   GEN:   Elderly woman.  No acute distress.   Neck: No JVD. Cardiac:  Irregularly irregular, soft systolic murmur, no gallop.  Respiratory: Nonlabored. Clear to auscultation bilaterally. GI: Soft, bowel sounds present. MS: No edema.  Labs    Chemistry Recent Labs  Lab 04/19/20 0422 04/20/20 0534 04/21/20 0512 04/22/20 0608 04/23/20 0357  NA 137 135 137 139 139  K 3.0* 3.5 3.2* 3.4* 3.9  CL 101 105 104 104 103  CO2 20* 20* 23 27 29   GLUCOSE 137* 169* 174* 83 114*  BUN 14 11 9 10  7*  CREATININE 0.60 0.58 0.57 0.65 0.73  CALCIUM 7.7* 7.6* 7.8* 8.0* 8.3*  PROT 5.9* 5.8* 5.9*  --   --   ALBUMIN 2.9* 2.9* 2.8*  --   --   AST 15 17 17   --   --   ALT 11 13 13   --   --   ALKPHOS 46 47 48  --   --   BILITOT 0.9 0.8 1.0  --   --   GFRNONAA >60 >60 >60 >60 >60  ANIONGAP 16* 10 10 8 7      Hematology Recent Labs  Lab 04/21/20 [phone] 04/22/20 [phone] 04/23/20 0357  WBC 6.2 6.4 6.6  RBC 4.11 4.00 4.36  HGB 12.3 11.9* 13.0  HCT 36.3 35.8* 39.2  MCV 88.3 89.5 89.9  MCH 29.9 29.8 29.8  MCHC 33.9 33.2 33.2  RDW 12.5 12.3 12.3  PLT 223 229 262    Radiology    No results found.  Cardiac Studies   Echocardiogram 10/05/2019: 1. Left ventricular ejection fraction, by estimation, is 60 to 65%. The  left ventricle has normal function. The left ventricle has no regional  wall motion abnormalities. There is moderate concentric left ventricular  hypertrophy and severe basal septal  hypertrophy .  2. Right ventricular systolic function is normal. The right ventricular  size is normal. Tricuspid regurgitation signal is inadequate for assessing  PA pressure.  3. Left atrial size was mildly dilated.  4. The mitral valve is normal in structure. Mild mitral valve  regurgitation. No evidence of mitral stenosis. Moderate mitral annular  calcification.  5. The aortic valve is tricuspid. Aortic valve regurgitation is not  visualized. Mild to moderate aortic valve sclerosis/calcification is   present, without any evidence of aortic stenosis.  6. The inferior vena cava is normal in size with greater than 50%  respiratory variability, suggesting right atrial pressure of 3 mmHg.   Patient Profile     75 y.o. female with past medical history of paroxysmal atrial fibrillation (diagnosed during admission in 09/2019), HTN, HLD, IDDM, SVT, history of DVT and history of CVA who is being seen today for the evaluation of atrial fibrillation with RVR.  Assessment & Plan    1.  Persistent atrial fibrillation with RVR complicated by partial SBO, now taking oral medications but heart rate still not well controlled.  CHA2DS2-VASc score is 6.  On Eliquis for stroke prophylaxis.  2.  Essential hypertension with history of orthostatic hypotension.  Systolic blood pressure recently 100-130 range.  Was on midodrine as an outpatient.  Continue Eliquis. Will resume midodrine although at 5 mg, 3 times a day.  Continue Cardizem CD 180 mg daily, increase Toprol-XL to 50 mg twice daily.   Signed, Rozann Lesches, MD  04/23/2020, 8:43 AM

## 2020-04-23 NOTE — Progress Notes (Signed)
PROGRESS NOTE    [name]  WLN:[id number] DOB: [birth year] DOA: 04/18/2020 PCP: [name], MD   Brief Narrative:   Courtney Roorda Thorpeis a 75 y.o.femalewith medical history significant forschizophrenia, dyslipidemia, chronic pain, GERD, type 2 diabetes, atrial fibrillation on Eliquis, and chronic iron deficiency anemia who was brought to the ED from her skilled facility due to worsening abdominal pain and mild distention. Patient apparently had nausea and vomiting as well over the last 4 days and it is uncertain whether or not she has been able to keep down her medications.  Patient was admitted with partial SBO as well as atrial fibrillation with RVR.  Heart rate is poorly controlled and cardiology has been consulted to assist.  She has been transitioned to her oral medications.  Diet advanced as tolerated.   Assessment & Plan:   Active Problems:   SBO (small bowel obstruction) (HCC)  Partial SBO-resolved now -Advanced to soft diet and tolerating, having BMs -Hold further IV fluid -Replete as needed for goal potassium of 4 and magnesium of 2 -Appreciate general surgery evaluation and recommendations -Zofran as needed for nausea and vomiting -Hopeful discharge back to [facility] in next 1-2 days  Atrial fibrillation with RVR-uncontrolled HR - Transfer to telemetry as patient is now OFF IV cardizem infusion - HR remains uncontrolled despite increased cardizem CD 180 mg and metoprolol XL increased by cardiology to 50 gm BID.  Pt restarted on midodrine 5 mg TID by cardiology.   - Appreciate cardiology consult for assistance with controlling HR in setting of soft BPs.  -Afib RVR thought excerbated acute illness -Resumed Eliquis for full anticoagulation -Monitor on telemetry  Type 2 diabetes, uncontrolled -Prior hemoglobin A1c 9.2% on 01/2020 -Blood glucose currently stable, reduce levemir to 15 units, add novolog 3 units TID with meals -Maintain on SSI CBG (last 3)   Recent Labs    04/23/20 0410 04/23/20 0749 04/23/20 1150  GLUCAP 119* 122* 136*   GERD -PPI daily for GI protection  History of schizophrenia -Resumed home oral medications -Haldol as needed for agitation  Chronic Back Pain -resumed some of her home medications for chronic pain with precautions -IV pain medications only as needed for breakthrough  Iron deficiency anemia-stable -Currently appears stable with no other bleeding -Follow CBC to monitor while on heparin drip -Holdhome iron supplementation -Stool occult negative for bleeding per EDP  Low TSH -Free T4 elevated at 1.52 -Recommend endocrinology follow-up outpatient  DVT prophylaxis: apixaban  Code Status: Full Family Communication:  Tried calling guardian [name] with no response multiple times Disposition Plan:  Status is: Inpatient  Remains inpatient appropriate because:Persistent severe electrolyte disturbances, IV treatments appropriate due to intensity of illness or inability to take PO and Inpatient level of care appropriate due to severity of illness  Dispo: The patient is from:  [facility]  Anticipated d/c is to:  [facility]  Patient currently is not medically stable to d/c.              Difficult to place patient No  Consultants:   General surgery  Procedures:   See below  Antimicrobials:   None  Subjective: Pt denies chest pain and SOB.  No diarrhea.      Objective: Vitals:   04/23/20 1151 04/23/20 1200 04/23/20 1225 04/23/20 1412  BP:  (!) 166/109 (!) 113/51 99/68  Pulse:  (!) 53 65 95  Resp:  (!) 21 12 17   Temp: 98 F (36.7 C)  98.4 F (36.9 C)  TempSrc: Oral     SpO2:  98% 96% 96%  Weight:      Height:        Intake/Output Summary (Last 24 hours) at 04/23/2020 1457 Last data filed at 04/23/2020 0700 Gross per 24 hour  Intake 53.76 ml  Output 2675 ml  Net -2621.24 ml   Filed Weights   04/18/20 1029 04/21/20 1213  04/23/20 0500  Weight: 57.6 kg 57.8 kg 57.8 kg    Examination:  General exam: elderly frail female with confusion, edentulous.  NAD.  Respiratory system: Clear to auscultation. Respiratory effort normal. Cardiovascular system: tachycardic, irregularly irregular.    Gastrointestinal system:  Soft, nondistended, nontender, no masses palpated.   Central nervous system:  awake, alert, moving all extremities. Extremities: no CCE Skin: No significant lesions noted Psychiatry: Flat affect.  Data Reviewed: I have personally reviewed following labs and imaging studies  CBC: Recent Labs  Lab 04/18/20 1105 04/19/20 0422 04/20/20 0534 04/21/20 0512 04/22/20 0608 04/23/20 0357  WBC 9.5 6.9 7.9 6.2 6.4 6.6  NEUTROABS 7.9*  --   --   --   --   --   HGB 13.8 12.1 11.5* 12.3 11.9* 13.0  HCT 39.6 35.1* 34.5* 36.3 35.8* 39.2  MCV 86.7 88.2 88.9 88.3 89.5 89.9  PLT 250 219 201 223 229 956   Basic Metabolic Panel: Recent Labs  Lab 04/19/20 0422 04/20/20 0534 04/21/20 0512 04/22/20 0608 04/23/20 0357  NA 137 135 137 139 139  K 3.0* 3.5 3.2* 3.4* 3.9  CL 101 105 104 104 103  CO2 20* 20* 23 27 29   GLUCOSE 137* 169* 174* 83 114*  BUN 14 11 9 10  7*  CREATININE 0.60 0.58 0.57 0.65 0.73  CALCIUM 7.7* 7.6* 7.8* 8.0* 8.3*  MG 1.8 2.0 2.0  --  2.0   GFR: Estimated Creatinine Clearance: 53.3 mL/min (by C-G formula based on SCr of 0.73 mg/dL). Liver Function Tests: Recent Labs  Lab 04/18/20 1105 04/19/20 0422 04/20/20 0534 04/21/20 0512  AST 18 15 17 17   ALT 13 11 13 13   ALKPHOS 53 46 47 48  BILITOT 0.7 0.9 0.8 1.0  PROT 6.7 5.9* 5.8* 5.9*  ALBUMIN 3.4* 2.9* 2.9* 2.8*   Recent Labs  Lab 04/18/20 1105  LIPASE 19   No results for input(s): AMMONIA in the last 168 hours. Coagulation Profile: No results for input(s): INR, PROTIME in the last 168 hours. Cardiac Enzymes: No results for input(s): CKTOTAL, CKMB, CKMBINDEX, TROPONINI in the last 168 hours. BNP (last 3 results) No  results for input(s): PROBNP in the last 8760 hours. HbA1C: No results for input(s): HGBA1C in the last 72 hours. CBG: Recent Labs  Lab 04/22/20 1944 04/23/20 0022 04/23/20 0410 04/23/20 0749 04/23/20 1150  GLUCAP 178* 187* 119* 122* 136*   Lipid Profile: No results for input(s): CHOL, HDL, LDLCALC, TRIG, CHOLHDL, LDLDIRECT in the last 72 hours. Thyroid Function Tests: No results for input(s): TSH, T4TOTAL, FREET4, T3FREE, THYROIDAB in the last 72 hours. Anemia Panel: No results for input(s): VITAMINB12, FOLATE, FERRITIN, TIBC, IRON, RETICCTPCT in the last 72 hours. Sepsis Labs: No results for input(s): PROCALCITON, LATICACIDVEN in the last 168 hours.  Recent Results (from the past 240 hour(s))  Resp Panel by RT-PCR (Flu A&B, Covid) Nasopharyngeal Swab     Status: None   Collection Time: 04/18/20  1:30 PM   Specimen: Nasopharyngeal Swab; Nasopharyngeal(NP) swabs in vial transport medium  Result Value Ref Range Status  SARS Coronavirus 2 by RT PCR NEGATIVE NEGATIVE Final    Comment: (NOTE) SARS-CoV-2 target nucleic acids are NOT DETECTED.  The SARS-CoV-2 RNA is generally detectable in upper respiratory specimens during the acute phase of infection. The lowest concentration of SARS-CoV-2 viral copies this assay can detect is 138 copies/mL. A negative result does not preclude SARS-Cov-2 infection and should not be used as the sole basis for treatment or other patient management decisions. A negative result may occur with  improper specimen collection/handling, submission of specimen other than nasopharyngeal swab, presence of viral mutation(s) within the areas targeted by this assay, and inadequate number of viral copies(<138 copies/mL). A negative result must be combined with clinical observations, patient history, and epidemiological information. The expected result is Negative.  Fact Sheet for Patients:  EntrepreneurPulse.com.au  Fact Sheet for  Healthcare Providers:  IncredibleEmployment.be  This test is no t yet approved or cleared by the [place] FDA and  has been authorized for detection and/or diagnosis of SARS-CoV-2 by FDA under an Emergency Use Authorization (EUA). This EUA will remain  in effect (meaning this test can be used) for the duration of the COVID-19 declaration under Section 564(b)(1) of the Act, 21 U.S.C.section 360bbb-3(b)(1), unless the authorization is terminated  or revoked sooner.       Influenza A by PCR NEGATIVE NEGATIVE Final   Influenza B by PCR NEGATIVE NEGATIVE Final    Comment: (NOTE) The Xpert Xpress SARS-CoV-2/FLU/RSV plus assay is intended as an aid in the diagnosis of influenza from Nasopharyngeal swab specimens and should not be used as a sole basis for treatment. Nasal washings and aspirates are unacceptable for Xpert Xpress SARS-CoV-2/FLU/RSV testing.  Fact Sheet for Patients: EntrepreneurPulse.com.au  Fact Sheet for Healthcare Providers: IncredibleEmployment.be  This test is not yet approved or cleared by the [place] FDA and has been authorized for detection and/or diagnosis of SARS-CoV-2 by FDA under an Emergency Use Authorization (EUA). This EUA will remain in effect (meaning this test can be used) for the duration of the COVID-19 declaration under Section 564(b)(1) of the Act, 21 U.S.C. section 360bbb-3(b)(1), unless the authorization is terminated or revoked.  Performed at [facility], [address], [place], [place] [address]   MRSA PCR Screening     Status: None   Collection Time: 04/18/20  8:47 PM   Specimen: Nasopharyngeal  Result Value Ref Range Status   MRSA by PCR NEGATIVE NEGATIVE Final    Comment:        The GeneXpert MRSA Assay (FDA approved for NASAL specimens only), is one component of a comprehensive MRSA colonization surveillance program. It is not intended to diagnose MRSA infection nor  to guide or monitor treatment for MRSA infections. Performed at [facility], [address] [place], [place] [address]    Radiology Studies: No results found.  Scheduled Meds: . apixaban  5 mg Oral BID  . ARIPiprazole  2 mg Oral QHS  . atorvastatin  40 mg Oral QHS  . bisacodyl  10 mg Rectal BID AC & HS  . Chlorhexidine Gluconate Cloth  6 each Topical Daily  . diltiazem  180 mg Oral Daily  . DULoxetine  60 mg Oral Daily  . HYDROcodone-acetaminophen  1 tablet Oral TID  . insulin aspart  0-9 Units Subcutaneous TID WC  . insulin aspart  3 Units Subcutaneous TID WC  . insulin detemir  15 Units Subcutaneous QHS  . latanoprost  1 drop Both Eyes QHS  . metoprolol succinate  50  mg Oral BID  . midodrine  5 mg Oral TID WC  . mirabegron ER  25 mg Oral Daily  . mirtazapine  30 mg Oral QHS  . pantoprazole  40 mg Oral Q0600  . polyethylene glycol  17 g Oral BID  . risperiDONE  1 mg Oral BID   Continuous Infusions:    LOS: 5 days   Time spent: 35 minutes  Danen Lapaglia Wynetta Emery, MD How to contact the [facility] Attending or Consulting provider [place] or covering provider during after hours [place], for this patient?  1. Check the care team in [facility] and look for a) attending/consulting [facility] provider listed and b) the [facility] team listed 2. Log into [email or web address] and use [place]'s universal password to access. If you do not have the password, please contact the hospital operator. 3. Locate the [facility] provider you are looking for under [facility] and page to a number that you can be directly reached. 4. If you still have difficulty reaching the provider, please page the [facility] (Director on Call) for the Hospitalists listed on amion for assistance.

## 2020-04-24 DIAGNOSIS — I4891 Unspecified atrial fibrillation: Secondary | ICD-10-CM | POA: Diagnosis not present

## 2020-04-24 DIAGNOSIS — K56609 Unspecified intestinal obstruction, unspecified as to partial versus complete obstruction: Secondary | ICD-10-CM | POA: Diagnosis not present

## 2020-04-24 DIAGNOSIS — E876 Hypokalemia: Secondary | ICD-10-CM | POA: Diagnosis not present

## 2020-04-24 LAB — BASIC METABOLIC PANEL
Anion gap: 9 (ref 5–15)
BUN: 8 mg/dL (ref 8–23)
CO2: 26 mmol/L (ref 22–32)
Calcium: 8.5 mg/dL — ABNORMAL LOW (ref 8.9–10.3)
Chloride: 100 mmol/L (ref 98–111)
Creatinine, Ser: 0.62 mg/dL (ref 0.44–1.00)
GFR, Estimated: >60 mL/min (ref >60)
Glucose, Bld: 173 mg/dL — ABNORMAL HIGH (ref 70–99)
Potassium: 4.3 mmol/L (ref 3.5–5.1)
Sodium: 135 mmol/L (ref 135–145)

## 2020-04-24 LAB — GLUCOSE, CAPILLARY
Glucose-Capillary: 155 mg/dL — ABNORMAL HIGH (ref 70–99)
Glucose-Capillary: 154 mg/dL — ABNORMAL HIGH (ref 70–99)
Glucose-Capillary: 167 mg/dL — ABNORMAL HIGH (ref 70–99)
Glucose-Capillary: 225 mg/dL — ABNORMAL HIGH (ref 70–99)

## 2020-04-24 LAB — MAGNESIUM: Magnesium: 2 mg/dL (ref 1.7–2.4)

## 2020-04-24 MED ORDER — INSULIN ASPART 100 UNIT/ML ~~LOC~~ SOLN
2.0000 [IU] | Freq: Three times a day (TID) | SUBCUTANEOUS | Status: DC
  Administered 2020-04-25 – 2020-04-26 (×4): 2 [IU] via SUBCUTANEOUS

## 2020-04-24 NOTE — Plan of Care (Signed)
  Problem: Acute Rehab PT Goals(only PT should resolve) Goal: Pt Will Go Supine/Side To Sit Outcome: Progressing Flowsheets (Taken 04/24/2020 1214) Pt will go Supine/Side to Sit: with min guard assist Goal: Pt Will Go Sit To Supine/Side Outcome: Progressing Flowsheets (Taken 04/24/2020 1214) Pt will go Sit to Supine/Side: with min guard assist Goal: Pt Will Transfer Bed To Chair/Chair To Bed Outcome: Progressing Flowsheets (Taken 04/24/2020 1214) Pt will Transfer Bed to Chair/Chair to Bed: min guard assist Goal: Pt Will Ambulate Outcome: Progressing Flowsheets (Taken 04/24/2020 1214) Pt will Ambulate:  15 feet  with minimal assist  with least restrictive assistive device  12:15 PM, 04/24/20 Mearl Latin PT, DPT Physical Therapist at [facility]

## 2020-04-24 NOTE — NC FL2 (Signed)
[place] LEVEL OF CARE SCREENING TOOL     IDENTIFICATION  Patient Name: [name] Birthdate: 1946-01-03 Sex: female Admission Date (Current Location): 04/18/2020  [facility] and [place] Number:  [employer] and Address:  [place] [address], [place]      Provider Number: [id number]  Attending Physician Name and Address:  Murlean Iba, MD  Relative Name and Phone Number:  [name] (Other)   [phone]    Current Level of Care: Hospital Recommended Level of Care: [place] Prior Approval Number:    Date Approved/Denied:   PASRR Number:    Discharge Plan: Other (Comment) (ALF)    Current Diagnoses: Patient Active Problem List   Diagnosis Date Noted  . Hypokalemia   . SBO (small bowel obstruction) ([place]) 04/18/2020  . Chest pain 02/21/2020  . Hemorrhoids 11/26/2019  . Atrial fibrillation with RVR ([place]) 10/04/2019  . GERD (gastroesophageal reflux disease) 08/26/2019  . Loss of weight 08/26/2019  . Constipation 09/09/2018  . Hypotension 03/21/2018  . Diarrhea 07/20/2017  . Primary osteoarthritis of right hip 04/18/2017  . Orthostasis 03/28/2017  . General weakness 03/28/2017  . History of cerebrovascular accident (CVA) with residual deficit 03/28/2017  . Schizophrenia ([place]) 03/28/2017  . Generalized weakness 03/28/2017  . Orthostatic hypotension   . IBS (irritable bowel syndrome) 10/16/2016  . Left hemiparesis ([place]) 10/06/2016  . Left-sided weakness 10/06/2016  . Chronic superficial gastritis without bleeding   . Stricture and stenosis of esophagus   . Nausea with vomiting 04/20/2016  . Abdominal pain, acute, bilateral lower quadrant 03/22/2016  . Lesion of right lobe of liver 03/22/2016  . Elevated troponin 02/13/2016  . IDA (iron deficiency anemia) 04/06/2015  . Mixed hyperlipidemia 11/06/2014  . Obesity due to excess calories 11/06/2014  . Sedentary lifestyle 11/06/2014   . Rectal bleeding 10/21/2014  . Encounter for therapeutic drug monitoring 03/24/2013  . Chronic anticoagulation 04/09/2010  . Type 2 diabetes mellitus with vascular disease ([place]) 03/24/2010  . Essential hypertension, benign 03/24/2010  . DVT, lower extremity, recurrent ([place]) 03/24/2010  . History of cardiovascular disorder 03/24/2010    Orientation RESPIRATION BLADDER Height & Weight     Self,Time  Normal Incontinent Weight: 127 lb 6.8 oz (57.8 kg) Height:  5\' 4"  (162.6 cm)  BEHAVIORAL SYMPTOMS/MOOD NEUROLOGICAL BOWEL NUTRITION STATUS      Incontinent Diet (Reduced Concentrated Sweets)  AMBULATORY STATUS COMMUNICATION OF NEEDS Skin   Limited Assist Verbally Normal                       Personal Care Assistance Level of Assistance  Bathing,Feeding,Dressing Bathing Assistance: Limited assistance Feeding assistance: Limited assistance Dressing Assistance: Limited assistance     Functional Limitations Info  Sight,Hearing,Speech Sight Info: Impaired Hearing Info: Adequate Speech Info: Adequate    SPECIAL CARE FACTORS FREQUENCY                       Contractures Contractures Info: Not present    Additional Factors Info  Code Status,Allergies Code Status Info: Full Allergies Info: Sulfa Antibiotics           Current Medications (04/24/2020):  This is the current hospital active medication list Current Facility-Administered Medications  Medication Dose Route Frequency Provider Last Rate Last Admin  . acetaminophen (TYLENOL) tablet 650 mg  650 mg Oral Q6H PRN Heath Lark D, DO   650 mg at 04/23/20 [phone]  Or  . acetaminophen (TYLENOL) suppository 650 mg  650 mg Rectal Q6H PRN Manuella Ghazi, Pratik D, DO      . apixaban (ELIQUIS) tablet 5 mg  5 mg Oral BID Wynetta Emery, Clanford L, MD   5 mg at 04/24/20 0925  . ARIPiprazole (ABILIFY) tablet 2 mg  2 mg Oral QHS Shah, Pratik D, DO   2 mg at 04/23/20 2313  . atorvastatin (LIPITOR) tablet 40 mg  40 mg Oral QHS Shah, Pratik D,  DO   40 mg at 04/23/20 2127  . bisacodyl (DULCOLAX) suppository 10 mg  10 mg Rectal BID AC & HS Aviva Signs, MD   10 mg at 04/23/20 2137  . Chlorhexidine Gluconate Cloth 2 % PADS 6 each  6 each Topical Daily Heath Lark D, DO   6 each at 04/24/20 1110  . clonazePAM (KLONOPIN) tablet 0.5 mg  0.5 mg Oral TID PRN Manuella Ghazi, Pratik D, DO   0.5 mg at 04/24/20 7829  . diltiazem (CARDIZEM CD) 24 hr capsule 180 mg  180 mg Oral Daily Johnson, Clanford L, MD   180 mg at 04/24/20 5621  . DULoxetine (CYMBALTA) DR capsule 60 mg  60 mg Oral Daily Manuella Ghazi, Pratik D, DO   60 mg at 04/24/20 3086  . haloperidol lactate (HALDOL) injection 2 mg  2 mg Intravenous Q6H PRN Heath Lark D, DO   2 mg at 04/22/20 1312  . HYDROcodone-acetaminophen (NORCO) 10-325 MG per tablet 1 tablet  1 tablet Oral TID Murlean Iba, MD   1 tablet at 04/24/20 0924  . HYDROmorphone (DILAUDID) injection 0.25 mg  0.25 mg Intravenous Q2H PRN Johnson, Clanford L, MD   0.25 mg at 04/23/20 1220  . insulin aspart (novoLOG) injection 0-9 Units  0-9 Units Subcutaneous TID WC Wynetta Emery, Clanford L, MD   2 Units at 04/24/20 0846  . insulin aspart (novoLOG) injection 3 Units  3 Units Subcutaneous TID WC Johnson, Clanford L, MD   3 Units at 04/22/20 1728  . insulin detemir (LEVEMIR) injection 15 Units  15 Units Subcutaneous QHS Johnson, Clanford L, MD   15 Units at 04/23/20 2313  . labetalol (NORMODYNE) injection 5 mg  5 mg Intravenous Q6H PRN Adefeso, Oladapo, DO      . latanoprost (XALATAN) 0.005 % ophthalmic solution 1 drop  1 drop Both Eyes QHS Shah, Pratik D, DO   1 drop at 04/23/20 2137  . metoprolol succinate (TOPROL-XL) 24 hr tablet 50 mg  50 mg Oral BID Satira Sark, MD   50 mg at 04/24/20 5784  . midodrine (PROAMATINE) tablet 5 mg  5 mg Oral TID WC Satira Sark, MD   5 mg at 04/24/20 6962  . milk and molasses enema  1 enema Rectal BID PRN Aviva Signs, MD   240 mL at 04/19/20 0930  . mirabegron ER (MYRBETRIQ) tablet 25 mg  25 mg Oral  Daily Manuella Ghazi, Pratik D, DO   25 mg at 04/24/20 9528  . mirtazapine (REMERON) tablet 30 mg  30 mg Oral QHS Shah, Pratik D, DO   30 mg at 04/23/20 2128  . naphazoline-glycerin (CLEAR EYES REDNESS) ophth solution 1-2 drop  1-2 drop Both Eyes QID PRN Manuella Ghazi, Pratik D, DO   1 drop at 04/24/20 1110  . ondansetron (ZOFRAN) tablet 4 mg  4 mg Oral Q6H PRN Manuella Ghazi, Pratik D, DO   4 mg at 04/21/20 1858   Or  . ondansetron [facility]) injection 4 mg  4 mg Intravenous Q6H  PRN Heath Lark D, DO   4 mg at 04/24/20 1110  . pantoprazole (PROTONIX) EC tablet 40 mg  40 mg Oral Q0600 Johnson, Clanford L, MD   40 mg at 04/24/20 0522  . polyethylene glycol (MIRALAX / GLYCOLAX) packet 17 g  17 g Oral BID Aviva Signs, MD   17 g at 04/23/20 2127  . potassium chloride (KLOR-CON) CR tablet 10 mEq  10 mEq Oral Daily Johnson, Clanford L, MD   10 mEq at 04/24/20 0923  . prochlorperazine (COMPAZINE) tablet 10 mg  10 mg Oral Q6H PRN Adefeso, Oladapo, DO   10 mg at 04/22/20 1044  . risperiDONE (RISPERDAL) tablet 1 mg  1 mg Oral BID Manuella Ghazi, Pratik D, DO   1 mg at 04/24/20 1444  . senna (SENOKOT) tablet 8.6 mg  1 tablet Oral QHS Johnson, Clanford L, MD   8.6 mg at 04/23/20 2128     Discharge Medications: Please see discharge summary for a list of discharge medications.  Relevant Imaging Results:  Relevant Lab Results:   Additional Information Pt SSN: [id number]  Natasha Bence, LCSW

## 2020-04-24 NOTE — Progress Notes (Addendum)
PROGRESS NOTE    [name]  LFY:[id number] DOB: Oct 11, 1945 DOA: 04/18/2020 PCP: [name], MD   Brief Narrative:   [name] a 75 y.o.femalewith medical history significant forschizophrenia, dyslipidemia, chronic pain, GERD, type 2 diabetes, atrial fibrillation on Eliquis, and chronic iron deficiency anemia who was brought to the ED from her skilled facility due to worsening abdominal pain and mild distention. Patient apparently had nausea and vomiting as well over the last 4 days and it is uncertain whether or not she has been able to keep down her medications.  Patient was admitted with partial SBO as well as atrial fibrillation with RVR.  Heart rate is poorly controlled and cardiology has been consulted to assist.  She has been transitioned to her oral medications.  Diet advanced as tolerated.   Assessment & Plan:   Active Problems:   SBO (small bowel obstruction) (HCC)   Hypokalemia  Partial SBO-resolved now -Advanced to soft diet and tolerating, having BMs -Hold further IV fluid -Replete as needed for goal potassium of 4 and magnesium of 2 -Appreciate general surgery evaluation and recommendations -Zofran as needed for nausea and vomiting -Hopeful discharge back to [facility] on Monday 4/4 (facility cannot take until then)  Atrial fibrillation with RVR-better controlled today, continue current regimen - Transfer to telemetry as patient is now OFF IV cardizem infusion - HR remains uncontrolled despite increased cardizem CD 180 mg and metoprolol XL increased by cardiology to 50 gm BID.  Pt restarted on midodrine 5 mg TID by cardiology.   - Appreciate cardiology consult for assistance with controlling HR in setting of soft BPs.  -Afib RVR thought excerbated acute illness -Resumed Eliquis for full anticoagulation -Monitor on telemetry  Type 2 diabetes, uncontrolled -Prior hemoglobin A1c 9.2% on 01/2020 -Blood glucose currently stable, reduce levemir to  15 units, add novolog 3 units TID with meals -Maintain on SSI CBG (last 3)  Recent Labs    04/23/20 2015 04/24/20 0814 04/24/20 1130  GLUCAP 196* 155* 154*   GERD -PPI daily for GI protection  History of schizophrenia -Resumed home oral medications -Haldol as needed for agitation  Chronic Back Pain -resumed some of her home medications for chronic pain with precautions -IV pain medications only as needed for breakthrough  Iron deficiency anemia-stable -Currently appears stable with no other bleeding -Follow CBC to monitor while on heparin drip -Holdhome iron supplementation -Stool occult negative for bleeding per EDP  Low TSH -Free T4 elevated at 1.52 -Recommend endocrinology follow-up outpatient  DVT prophylaxis: apixaban  Code Status: Full Family Communication:  Tried calling guardian [name] with no response multiple times Disposition Plan: return to [facility] on Monday (Facility cannot take patient until 4/4)  Status is: Inpatient  Remains inpatient appropriate because:Persistent severe electrolyte disturbances, IV treatments appropriate due to intensity of illness or inability to take PO and Inpatient level of care appropriate due to severity of illness  Dispo: The patient is from:  [facility]  Anticipated d/c is to:  [facility]  Patient currently is not medically stable to d/c.              Difficult to place patient No  Consultants:   General surgery  Procedures:   See below  Antimicrobials:   None  Subjective: Pt denies chest pain and SOB.  No diarrhea.      Objective: Vitals:   04/23/20 1412 04/23/20 2052 04/24/20 0450 04/24/20 1441  BP: 99/68 (!) 142/69 Marland Kitchen)  103/57 107/61  Pulse: 95 85 (!) 55 (!) 53  Resp: 17 18 20 20   Temp: 98.4 F (36.9 C) 97.8 F (36.6 C) 97.6 F (36.4 C) 97.7 F (36.5 C)  TempSrc:  Oral Oral Oral  SpO2: 96% 98% 95% 97%  Weight:      Height:         Intake/Output Summary (Last 24 hours) at 04/24/2020 1502 Last data filed at 04/24/2020 1046 Gross per 24 hour  Intake 250 ml  Output 1000 ml  Net -750 ml   Filed Weights   04/18/20 1029 04/21/20 1213 04/23/20 0500  Weight: 57.6 kg 57.8 kg 57.8 kg    Examination:  General exam: elderly frail female with confusion, edentulous.  NAD.  Respiratory system: Clear to auscultation. Respiratory effort normal. Cardiovascular system:  irregularly irregular.    Gastrointestinal system:  Soft, nondistended, nontender, no masses palpated.   Central nervous system:  awake, alert, moving all extremities. Extremities: no CCE Skin: No significant lesions noted Psychiatry: Flat affect.  Data Reviewed: I have personally reviewed following labs and imaging studies  CBC: Recent Labs  Lab 04/18/20 1105 04/19/20 0422 04/20/20 0534 04/21/20 0512 04/22/20 0608 04/23/20 0357  WBC 9.5 6.9 7.9 6.2 6.4 6.6  NEUTROABS 7.9*  --   --   --   --   --   HGB 13.8 12.1 11.5* 12.3 11.9* 13.0  HCT 39.6 35.1* 34.5* 36.3 35.8* 39.2  MCV 86.7 88.2 88.9 88.3 89.5 89.9  PLT 250 219 201 223 229 034   Basic Metabolic Panel: Recent Labs  Lab 04/19/20 0422 04/20/20 0534 04/21/20 0512 04/22/20 0608 04/23/20 0357 04/24/20 0646  NA 137 135 137 139 139 135  K 3.0* 3.5 3.2* 3.4* 3.9 4.3  CL 101 105 104 104 103 100  CO2 20* 20* 23 27 29 26   GLUCOSE 137* 169* 174* 83 114* 173*  BUN 14 11 9 10  7* 8  CREATININE 0.60 0.58 0.57 0.65 0.73 0.62  CALCIUM 7.7* 7.6* 7.8* 8.0* 8.3* 8.5*  MG 1.8 2.0 2.0  --  2.0 2.0   GFR: Estimated Creatinine Clearance: 53.3 mL/min (by C-G formula based on SCr of 0.62 mg/dL). Liver Function Tests: Recent Labs  Lab 04/18/20 1105 04/19/20 0422 04/20/20 0534 04/21/20 0512  AST 18 15 17 17   ALT 13 11 13 13   ALKPHOS 53 46 47 48  BILITOT 0.7 0.9 0.8 1.0  PROT 6.7 5.9* 5.8* 5.9*  ALBUMIN 3.4* 2.9* 2.9* 2.8*   Recent Labs  Lab 04/18/20 1105  LIPASE 19   No results for  input(s): AMMONIA in the last 168 hours. Coagulation Profile: No results for input(s): INR, PROTIME in the last 168 hours. Cardiac Enzymes: No results for input(s): CKTOTAL, CKMB, CKMBINDEX, TROPONINI in the last 168 hours. BNP (last 3 results) No results for input(s): PROBNP in the last 8760 hours. HbA1C: No results for input(s): HGBA1C in the last 72 hours. CBG: Recent Labs  Lab 04/23/20 1150 04/23/20 1629 04/23/20 2015 04/24/20 0814 04/24/20 1130  GLUCAP 136* 125* 196* 155* 154*   Lipid Profile: No results for input(s): CHOL, HDL, LDLCALC, TRIG, CHOLHDL, LDLDIRECT in the last 72 hours. Thyroid Function Tests: No results for input(s): TSH, T4TOTAL, FREET4, T3FREE, THYROIDAB in the last 72 hours. Anemia Panel: No results for input(s): VITAMINB12, FOLATE, FERRITIN, TIBC, IRON, RETICCTPCT in the last 72 hours. Sepsis Labs: No results for input(s): PROCALCITON, LATICACIDVEN in the last 168 hours.  Recent Results (from the past 240 hour(s))  Resp Panel by RT-PCR (Flu A&B, Covid) Nasopharyngeal Swab     Status: None   Collection Time: 04/18/20  1:30 PM   Specimen: Nasopharyngeal Swab; Nasopharyngeal(NP) swabs in vial transport medium  Result Value Ref Range Status   SARS Coronavirus 2 by RT PCR NEGATIVE NEGATIVE Final    Comment: (NOTE) SARS-CoV-2 target nucleic acids are NOT DETECTED.  The SARS-CoV-2 RNA is generally detectable in upper respiratory specimens during the acute phase of infection. The lowest concentration of SARS-CoV-2 viral copies this assay can detect is 138 copies/mL. A negative result does not preclude SARS-Cov-2 infection and should not be used as the sole basis for treatment or other patient management decisions. A negative result may occur with  improper specimen collection/handling, submission of specimen other than nasopharyngeal swab, presence of viral mutation(s) within the areas targeted by this assay, and inadequate number of viral copies(<138  copies/mL). A negative result must be combined with clinical observations, patient history, and epidemiological information. The expected result is Negative.  Fact Sheet for Patients:  EntrepreneurPulse.com.au  Fact Sheet for Healthcare Providers:  IncredibleEmployment.be  This test is no t yet approved or cleared by the [place] FDA and  has been authorized for detection and/or diagnosis of SARS-CoV-2 by FDA under an Emergency Use Authorization (EUA). This EUA will remain  in effect (meaning this test can be used) for the duration of the COVID-19 declaration under Section 564(b)(1) of the Act, 21 U.S.C.section 360bbb-3(b)(1), unless the authorization is terminated  or revoked sooner.       Influenza A by PCR NEGATIVE NEGATIVE Final   Influenza B by PCR NEGATIVE NEGATIVE Final    Comment: (NOTE) The Xpert Xpress SARS-CoV-2/FLU/RSV plus assay is intended as an aid in the diagnosis of influenza from Nasopharyngeal swab specimens and should not be used as a sole basis for treatment. Nasal washings and aspirates are unacceptable for Xpert Xpress SARS-CoV-2/FLU/RSV testing.  Fact Sheet for Patients: EntrepreneurPulse.com.au  Fact Sheet for Healthcare Providers: IncredibleEmployment.be  This test is not yet approved or cleared by the [place] FDA and has been authorized for detection and/or diagnosis of SARS-CoV-2 by FDA under an Emergency Use Authorization (EUA). This EUA will remain in effect (meaning this test can be used) for the duration of the COVID-19 declaration under Section 564(b)(1) of the Act, 21 U.S.C. section 360bbb-3(b)(1), unless the authorization is terminated or revoked.  Performed at [facility], [address], [place], [place] [address]   MRSA PCR Screening     Status: None   Collection Time: 04/18/20  8:47 PM   Specimen: Nasopharyngeal  Result Value Ref Range Status   MRSA  by PCR NEGATIVE NEGATIVE Final    Comment:        The GeneXpert MRSA Assay (FDA approved for NASAL specimens only), is one component of a comprehensive MRSA colonization surveillance program. It is not intended to diagnose MRSA infection nor to guide or monitor treatment for MRSA infections. Performed at [facility], [address] [place], [place] [address]    Radiology Studies: No results found.  Scheduled Meds: . apixaban  5 mg Oral BID  . ARIPiprazole  2 mg Oral QHS  . atorvastatin  40 mg Oral QHS  . bisacodyl  10 mg Rectal BID AC & HS  . Chlorhexidine Gluconate Cloth  6 each Topical Daily  . diltiazem  180 mg Oral Daily  . DULoxetine  60 mg Oral Daily  . HYDROcodone-acetaminophen  1 tablet Oral TID  .  insulin aspart  0-9 Units Subcutaneous TID WC  . insulin aspart  3 Units Subcutaneous TID WC  . insulin detemir  15 Units Subcutaneous QHS  . latanoprost  1 drop Both Eyes QHS  . metoprolol succinate  50 mg Oral BID  . midodrine  5 mg Oral TID WC  . mirabegron ER  25 mg Oral Daily  . mirtazapine  30 mg Oral QHS  . pantoprazole  40 mg Oral Q0600  . polyethylene glycol  17 g Oral BID  . potassium chloride  10 mEq Oral Daily  . risperiDONE  1 mg Oral BID  . senna  1 tablet Oral QHS   Continuous Infusions:    LOS: 6 days   Time spent: 35 minutes  Kaitlyn Franko Wynetta Emery, MD How to contact the [facility] Attending or Consulting provider [place] or covering provider during after hours [place], for this patient?  1. Check the care team in [facility] and look for a) attending/consulting TRH provider listed and b) the [facility] team listed 2. Log into [email or web address] and use [place]'s universal password to access. If you do not have the password, please contact the hospital operator. 3. Locate the [facility] provider you are looking for under [facility] and page to a number that you can be directly reached. 4. If you still have difficulty reaching the provider, please page the [facility] (Director on  Call) for the Hospitalists listed on amion for assistance.

## 2020-04-24 NOTE — TOC Progression Note (Signed)
Transition of Care [facility]) - Progression Note    Patient Details  Name: [name] MRN: [id number] Date of Birth: September 14, 1945  [facility] CM/SW Contact  Natasha Bence, LCSW Phone Number: 04/24/2020, 1:24 PM  Clinical Narrative:    CSW contacted [facility] for d/c. [facility] reported that if medications had changed, they would not be able to take patient back until Monday. MD reported that medications had change. PT reported that patient was able to stand with min assist and walk in place steadily without loss of balance. PT reported that patient was groggy due to procedure and  requested not to walk the length of the hallway. TOC to follow.   Expected Discharge Plan: Assisted Living Barriers to Discharge: Continued Medical Work up  Expected Discharge Plan and Services Expected Discharge Plan: Assisted Living In-house Referral: Clinical Social Work     Living arrangements for the past 2 months: [place]                                       Social Determinants of Health (SDOH) Interventions    Readmission Risk Interventions No flowsheet data found.

## 2020-04-24 NOTE — Evaluation (Signed)
Physical Therapy Evaluation Patient Details Name: [name] MRN: [id number] DOB: 05-May-1945 Today's Date: 04/24/2020   History of Present Illness  [name] is a 75 y.o. female with medical history significant for schizophrenia, dyslipidemia, chronic pain, GERD, type 2 diabetes, atrial fibrillation on Eliquis, and chronic iron deficiency anemia who was brought to the ED from her skilled facility due to worsening abdominal pain and mild distention.  Patient apparently had nausea and vomiting as well over the last 4 days and it is uncertain whether or not she has been able to keep down her medications.  She appears to have some confusion as well.  She denies any diarrhea or bowel movements in the last few days.  No chest pain or shortness of breath are described.     Clinical Impression  Patient functioning at baseline for functional mobility and gait although she does show decreased strength and balance which affect functional mobility. Patient completes bed mobility with slow, labored movements. She transfers to standing with RW and is able to ambulate with RW at bedside with minimal assist with all mobility. Patient is limited today by c/o pain and stiffness wishing to return to bed. Patient will benefit from continued physical therapy in hospital and recommended venue below to increase strength, balance, endurance for safe ADLs and gait.      Follow Up Recommendations Other (comment) ([place])    Equipment Recommendations  None recommended by PT    Recommendations for Other Services       Precautions / Restrictions Precautions Precautions: Fall Restrictions Weight Bearing Restrictions: No      Mobility  Bed Mobility Overal bed mobility: Needs Assistance Bed Mobility: Supine to Sit     Supine to sit: Min guard;Min assist     General bed mobility comments: patient requires minimal assist for LE movement to transition to seated EOB     Transfers Overall transfer level: Needs assistance Equipment used: Rolling walker (2 wheeled) Transfers: Sit to/from Stand Sit to Stand: Min assist         General transfer comment: Patient requires min assist and cueing to transfer to standing with RW, assist for unsteadiness  Ambulation/Gait Ambulation/Gait assistance: Min assist;Min guard Gait Distance (Feet): 4 Feet Assistive device: Rolling walker (2 wheeled)       General Gait Details: slow, labored cadence at bedside, patient limited by c/o pain and stiffness wishing to return to bed  Stairs            Wheelchair Mobility    Modified Rankin (Stroke Patients Only)       Balance                                             Pertinent Vitals/Pain Pain Assessment: Faces Faces Pain Scale: Hurts even more Pain Location: back and stomahc Pain Descriptors / Indicators: Aching Pain Intervention(s): Limited activity within patient's tolerance;Monitored during session;Repositioned    Home Living Family/patient expects to be discharged to:: Assisted living                      Prior Function Level of Independence: Needs assistance   Gait / Transfers Assistance Needed: Patient states she uses a RW and wheel chair at [facility]  ADL's / Homemaking Assistance Needed: staff assists        Hand Dominance  Extremity/Trunk Assessment   Upper Extremity Assessment Upper Extremity Assessment: Generalized weakness    Lower Extremity Assessment Lower Extremity Assessment: Generalized weakness       Communication   Communication: No difficulties  Cognition Arousal/Alertness: Awake/alert Behavior During Therapy: WFL for tasks assessed/performed Overall Cognitive Status: Within Functional Limits for tasks assessed                                        General Comments      Exercises     Assessment/Plan    PT Assessment Patient needs continued PT  services  PT Problem List Decreased strength;Decreased activity tolerance;Decreased balance;Decreased mobility       PT Treatment Interventions DME instruction;Balance training;Gait training;Neuromuscular re-education;Stair training;Functional mobility training;Patient/family education;Therapeutic activities;Therapeutic exercise    PT Goals (Current goals can be found in the Care Plan section)  Acute Rehab PT Goals Patient Stated Goal: return home PT Goal Formulation: With patient Time For Goal Achievement: 05/01/20 Potential to Achieve Goals: Good    Frequency Min 3X/week   Barriers to discharge        Co-evaluation               AM-PAC PT "6 Clicks" Mobility  Outcome Measure Help needed turning from your back to your side while in a flat bed without using bedrails?: None Help needed moving from lying on your back to sitting on the side of a flat bed without using bedrails?: A Little Help needed moving to and from a bed to a chair (including a wheelchair)?: A Little Help needed standing up from a chair using your arms (e.g., wheelchair or bedside chair)?: A Little Help needed to walk in hospital room?: A Little Help needed climbing 3-5 steps with a railing? : A Lot 6 Click Score: 18    End of Session Equipment Utilized During Treatment: Gait belt Activity Tolerance: Patient tolerated treatment well Patient left: in bed;with call bell/phone within reach;with bed alarm set Nurse Communication: Mobility status PT Visit Diagnosis: Unsteadiness on feet (R26.81);Other abnormalities of gait and mobility (R26.89);Muscle weakness (generalized) (M62.81)    Time: 5852-7782 PT Time Calculation (min) (ACUTE ONLY): 10 min   Charges:   PT Evaluation $PT Eval Low Complexity: 1 Low          12:14 PM, 04/24/20 Mearl Latin PT, DPT Physical Therapist at [facility]

## 2020-04-25 DIAGNOSIS — E876 Hypokalemia: Secondary | ICD-10-CM | POA: Diagnosis not present

## 2020-04-25 DIAGNOSIS — I4891 Unspecified atrial fibrillation: Secondary | ICD-10-CM | POA: Diagnosis not present

## 2020-04-25 DIAGNOSIS — K56609 Unspecified intestinal obstruction, unspecified as to partial versus complete obstruction: Secondary | ICD-10-CM | POA: Diagnosis not present

## 2020-04-25 LAB — GLUCOSE, CAPILLARY
Glucose-Capillary: 180 mg/dL — ABNORMAL HIGH (ref 70–99)
Glucose-Capillary: 202 mg/dL — ABNORMAL HIGH (ref 70–99)
Glucose-Capillary: 152 mg/dL — ABNORMAL HIGH (ref 70–99)
Glucose-Capillary: 143 mg/dL — ABNORMAL HIGH (ref 70–99)
Glucose-Capillary: 116 mg/dL — ABNORMAL HIGH (ref 70–99)

## 2020-04-25 LAB — BASIC METABOLIC PANEL
Anion gap: 10 (ref 5–15)
BUN: 10 mg/dL (ref 8–23)
CO2: 27 mmol/L (ref 22–32)
Calcium: 8.7 mg/dL — ABNORMAL LOW (ref 8.9–10.3)
Chloride: 100 mmol/L (ref 98–111)
Creatinine, Ser: 0.78 mg/dL (ref 0.44–1.00)
GFR, Estimated: >60 mL/min (ref >60)
Glucose, Bld: 182 mg/dL — ABNORMAL HIGH (ref 70–99)
Potassium: 4.2 mmol/L (ref 3.5–5.1)
Sodium: 137 mmol/L (ref 135–145)

## 2020-04-25 LAB — MAGNESIUM: Magnesium: 2 mg/dL (ref 1.7–2.4)

## 2020-04-25 NOTE — Progress Notes (Signed)
PROGRESS NOTE    [name]  KPT:[id number] DOB: 19-Dec-1945 DOA: 04/18/2020 PCP: [name], MD   Brief Narrative:   [name] a 75 y.o.femalewith medical history significant forschizophrenia, dyslipidemia, chronic pain, GERD, type 2 diabetes, atrial fibrillation on Eliquis, and chronic iron deficiency anemia who was brought to the ED from her skilled facility due to worsening abdominal pain and mild distention. Patient apparently had nausea and vomiting as well over the last 4 days and it is uncertain whether or not she has been able to keep down her medications.  Patient was admitted with partial SBO as well as atrial fibrillation with RVR.  Heart rate is poorly controlled and cardiology has been consulted to assist.  She has been transitioned to her oral medications.  Diet advanced as tolerated.   Assessment & Plan:   Active Problems:   SBO (small bowel obstruction) (HCC)   Hypokalemia  Partial SBO-resolved now -Advanced to soft diet and tolerating, having BMs -Hold further IV fluid -Replete as needed for goal potassium of 4 and magnesium of 2 -Appreciate general surgery evaluation and recommendations -Zofran as needed for nausea and vomiting -Hopeful discharge back to [facility] on Monday 4/4 (facility cannot take until then)  Atrial fibrillation with RVR-it had been better controlled on 4/2, but now poorly controlled, continue current regimen for now as she was bradycardic for most of the day on 4/2.  - Transferred to telemetry and OFF IV cardizem infusion - Continue cardizem CD 180 mg and metoprolol XL 50 gm BID.  Continue midodrine 5 mg TID.    - Appreciate cardiology consult for assistance with controlling HR in setting of soft BPs.  -Resumed Eliquis for full anticoagulation -Monitor on telemetry  Type 2 diabetes, uncontrolled -Prior hemoglobin A1c 9.2% on 01/2020 -Blood glucose currently stable, reduce levemir to 15 units, add novolog 3 units TID  with meals -Maintain on SSI CBG (last 3)  Recent Labs    04/24/20 2106 04/25/20 0824 04/25/20 1102  GLUCAP 225* 180* 202*   GERD -PPI daily for GI protection  History of schizophrenia -Resumed home oral medications -Haldol as needed for agitation  Chronic Back Pain -resumed some of her home medications for chronic pain with precautions -IV pain medications only as needed for breakthrough  Iron deficiency anemia-stable -Currently appears stable with no other bleeding -Follow CBC to monitor while on heparin drip -Holdhome iron supplementation -Stool occult negative for bleeding per EDP  Low TSH -Free T4 elevated at 1.52 -Recommend endocrinology follow-up outpatient  DVT prophylaxis: apixaban  Code Status: Full Family Communication: Tried calling guardian [name] with no response multiple times Disposition Plan: return to [facility] on Monday or Tuesday (Facility cannot take patient until 4/4)  Status is: Inpatient  Remains inpatient appropriate because:Persistent severe electrolyte disturbances, IV treatments appropriate due to intensity of illness or inability to take PO and Inpatient level of care appropriate due to severity of illness  Dispo: The patient is from:  [facility]  Anticipated d/c is to:  [facility]  Patient currently is not medically stable to d/c.              Difficult to place patient No  Consultants:   General surgery  Procedures:   See below  Antimicrobials:   None  Subjective: Pt reports dry mouth.       Objective: Vitals:   04/24/20 0450 04/24/20 1441 04/24/20 2105 04/25/20 0530  BP: (!) 103/57 107/61 (!) 115/47 Marland Kitchen)  112/56  Pulse: (!) 55 (!) 53 (!) 47 68  Resp: 20 20 20 20   Temp: 97.6 F (36.4 C) 97.7 F (36.5 C) 97.9 F (36.6 C) 98.7 F (37.1 C)  TempSrc: Oral Oral Oral Oral  SpO2: 95% 97% 98% 98%  Weight:      Height:        Intake/Output Summary (Last 24 hours) at  04/25/2020 1220 Last data filed at 04/25/2020 1024 Gross per 24 hour  Intake 300 ml  Output 1200 ml  Net -900 ml   Filed Weights   04/18/20 1029 04/21/20 1213 04/23/20 0500  Weight: 57.6 kg 57.8 kg 57.8 kg   Examination:  General exam: elderly frail female with confusion, edentulous.  NAD.  Respiratory system: Clear to auscultation. Respiratory effort normal. Cardiovascular system:  irregularly irregular.    Gastrointestinal system:  Soft, nondistended, nontender, no masses palpated.   Central nervous system:  awake, alert, moving all extremities. Extremities: no CCE Skin: No significant lesions noted Psychiatry: Flat affect.  Data Reviewed: I have personally reviewed following labs and imaging studies  CBC: Recent Labs  Lab 04/19/20 0422 04/20/20 0534 04/21/20 0512 04/22/20 0608 04/23/20 0357  WBC 6.9 7.9 6.2 6.4 6.6  HGB 12.1 11.5* 12.3 11.9* 13.0  HCT 35.1* 34.5* 36.3 35.8* 39.2  MCV 88.2 88.9 88.3 89.5 89.9  PLT 219 201 223 229 892   Basic Metabolic Panel: Recent Labs  Lab 04/20/20 0534 04/21/20 0512 04/22/20 0608 04/23/20 0357 04/24/20 0646 04/25/20 0618  NA 135 137 139 139 135 137  K 3.5 3.2* 3.4* 3.9 4.3 4.2  CL 105 104 104 103 100 100  CO2 20* 23 27 29 26 27   GLUCOSE 169* 174* 83 114* 173* 182*  BUN 11 9 10  7* 8 10  CREATININE 0.58 0.57 0.65 0.73 0.62 0.78  CALCIUM 7.6* 7.8* 8.0* 8.3* 8.5* 8.7*  MG 2.0 2.0  --  2.0 2.0 2.0   GFR: Estimated Creatinine Clearance: 53.3 mL/min (by C-G formula based on SCr of 0.78 mg/dL). Liver Function Tests: Recent Labs  Lab 04/19/20 0422 04/20/20 0534 04/21/20 0512  AST 15 17 17   ALT 11 13 13   ALKPHOS 46 47 48  BILITOT 0.9 0.8 1.0  PROT 5.9* 5.8* 5.9*  ALBUMIN 2.9* 2.9* 2.8*   No results for input(s): LIPASE, AMYLASE in the last 168 hours. No results for input(s): AMMONIA in the last 168 hours. Coagulation Profile: No results for input(s): INR, PROTIME in the last 168 hours. Cardiac Enzymes: No results  for input(s): CKTOTAL, CKMB, CKMBINDEX, TROPONINI in the last 168 hours. BNP (last 3 results) No results for input(s): PROBNP in the last 8760 hours. HbA1C: No results for input(s): HGBA1C in the last 72 hours. CBG: Recent Labs  Lab 04/24/20 1130 04/24/20 1623 04/24/20 2106 04/25/20 0824 04/25/20 1102  GLUCAP 154* 167* 225* 180* 202*   Lipid Profile: No results for input(s): CHOL, HDL, LDLCALC, TRIG, CHOLHDL, LDLDIRECT in the last 72 hours. Thyroid Function Tests: No results for input(s): TSH, T4TOTAL, FREET4, T3FREE, THYROIDAB in the last 72 hours. Anemia Panel: No results for input(s): VITAMINB12, FOLATE, FERRITIN, TIBC, IRON, RETICCTPCT in the last 72 hours. Sepsis Labs: No results for input(s): PROCALCITON, LATICACIDVEN in the last 168 hours.  Recent Results (from the past 240 hour(s))  Resp Panel by RT-PCR (Flu A&B, Covid) Nasopharyngeal Swab     Status: None   Collection Time: 04/18/20  1:30 PM   Specimen: Nasopharyngeal Swab; Nasopharyngeal(NP) swabs in vial transport medium  Result Value Ref Range Status   SARS Coronavirus 2 by RT PCR NEGATIVE NEGATIVE Final    Comment: (NOTE) SARS-CoV-2 target nucleic acids are NOT DETECTED.  The SARS-CoV-2 RNA is generally detectable in upper respiratory specimens during the acute phase of infection. The lowest concentration of SARS-CoV-2 viral copies this assay can detect is 138 copies/mL. A negative result does not preclude SARS-Cov-2 infection and should not be used as the sole basis for treatment or other patient management decisions. A negative result may occur with  improper specimen collection/handling, submission of specimen other than nasopharyngeal swab, presence of viral mutation(s) within the areas targeted by this assay, and inadequate number of viral copies(<138 copies/mL). A negative result must be combined with clinical observations, patient history, and epidemiological information. The expected result is  Negative.  Fact Sheet for Patients:  EntrepreneurPulse.com.au  Fact Sheet for Healthcare Providers:  IncredibleEmployment.be  This test is no t yet approved or cleared by the [place] FDA and  has been authorized for detection and/or diagnosis of SARS-CoV-2 by FDA under an Emergency Use Authorization (EUA). This EUA will remain  in effect (meaning this test can be used) for the duration of the COVID-19 declaration under Section 564(b)(1) of the Act, 21 U.S.C.section 360bbb-3(b)(1), unless the authorization is terminated  or revoked sooner.       Influenza A by PCR NEGATIVE NEGATIVE Final   Influenza B by PCR NEGATIVE NEGATIVE Final    Comment: (NOTE) The Xpert Xpress SARS-CoV-2/FLU/RSV plus assay is intended as an aid in the diagnosis of influenza from Nasopharyngeal swab specimens and should not be used as a sole basis for treatment. Nasal washings and aspirates are unacceptable for Xpert Xpress SARS-CoV-2/FLU/RSV testing.  Fact Sheet for Patients: EntrepreneurPulse.com.au  Fact Sheet for Healthcare Providers: IncredibleEmployment.be  This test is not yet approved or cleared by the [place] FDA and has been authorized for detection and/or diagnosis of SARS-CoV-2 by FDA under an Emergency Use Authorization (EUA). This EUA will remain in effect (meaning this test can be used) for the duration of the COVID-19 declaration under Section 564(b)(1) of the Act, 21 U.S.C. section 360bbb-3(b)(1), unless the authorization is terminated or revoked.  Performed at [facility], [address], [place], [place] [address]   MRSA PCR Screening     Status: None   Collection Time: 04/18/20  8:47 PM   Specimen: Nasopharyngeal  Result Value Ref Range Status   MRSA by PCR NEGATIVE NEGATIVE Final    Comment:        The GeneXpert MRSA Assay (FDA approved for NASAL specimens only), is one component of  a comprehensive MRSA colonization surveillance program. It is not intended to diagnose MRSA infection nor to guide or monitor treatment for MRSA infections. Performed at [facility], [address], [place], [place] [address]    Radiology Studies: No results found.  Scheduled Meds: . apixaban  5 mg Oral BID  . ARIPiprazole  2 mg Oral QHS  . atorvastatin  40 mg Oral QHS  . bisacodyl  10 mg Rectal BID AC & HS  . Chlorhexidine Gluconate Cloth  6 each Topical Daily  . diltiazem  180 mg Oral Daily  . DULoxetine  60 mg Oral Daily  . HYDROcodone-acetaminophen  1 tablet Oral TID  . insulin aspart  0-9 Units Subcutaneous TID WC  . insulin aspart  2 Units Subcutaneous TID WC  . insulin detemir  15 Units Subcutaneous QHS  . latanoprost  1 drop Both Eyes  QHS  . metoprolol succinate  50 mg Oral BID  . midodrine  5 mg Oral TID WC  . mirabegron ER  25 mg Oral Daily  . mirtazapine  30 mg Oral QHS  . pantoprazole  40 mg Oral Q0600  . polyethylene glycol  17 g Oral BID  . potassium chloride  10 mEq Oral Daily  . risperiDONE  1 mg Oral BID  . senna  1 tablet Oral QHS   Continuous Infusions:    LOS: 7 days   Time spent: 35 minutes  Arlene Genova Wynetta Emery, MD How to contact the [facility] Attending or Consulting provider [place] or covering provider during after hours [place], for this patient?  1. Check the care team in [facility] and look for a) attending/consulting [facility] provider listed and b) the [facility] team listed 2. Log into [email or web address] and use [place]'s universal password to access. If you do not have the password, please contact the hospital operator. 3. Locate the [facility] provider you are looking for under [facility] and page to a number that you can be directly reached. 4. If you still have difficulty reaching the provider, please page the [facility] (Director on Call) for the Hospitalists listed on amion for assistance.

## 2020-04-26 DIAGNOSIS — K56609 Unspecified intestinal obstruction, unspecified as to partial versus complete obstruction: Secondary | ICD-10-CM | POA: Diagnosis not present

## 2020-04-26 DIAGNOSIS — E876 Hypokalemia: Secondary | ICD-10-CM | POA: Diagnosis not present

## 2020-04-26 LAB — GLUCOSE, CAPILLARY
Glucose-Capillary: 166 mg/dL — ABNORMAL HIGH (ref 70–99)
Glucose-Capillary: 211 mg/dL — ABNORMAL HIGH (ref 70–99)
Glucose-Capillary: 155 mg/dL — ABNORMAL HIGH (ref 70–99)

## 2020-04-26 MED ORDER — METOPROLOL SUCCINATE ER 25 MG PO TB24: 75.0000 mg | ORAL_TABLET | Freq: Two times a day (BID) | ORAL | 3 refills | Status: DC

## 2020-04-26 MED ORDER — METOPROLOL SUCCINATE ER 50 MG PO TB24: 75.0000 mg | ORAL_TABLET | Freq: Two times a day (BID) | ORAL | Status: DC

## 2020-04-26 MED ORDER — ZOLPIDEM TARTRATE 5 MG PO TABS: 5.0000 mg | ORAL_TABLET | Freq: Every evening | ORAL | 0 refills | Status: DC | PRN

## 2020-04-26 MED ORDER — CLONAZEPAM 0.5 MG PO TABS: 0.5000 mg | ORAL_TABLET | Freq: Three times a day (TID) | ORAL | 0 refills | Status: DC | PRN

## 2020-04-26 MED ORDER — HYDROCODONE-ACETAMINOPHEN 10-325 MG PO TABS: 1.0000 | ORAL_TABLET | Freq: Three times a day (TID) | ORAL | 0 refills | Status: DC | PRN

## 2020-04-26 MED ORDER — DILTIAZEM HCL ER COATED BEADS 180 MG PO CP24: 180.0000 mg | ORAL_CAPSULE | Freq: Every day | ORAL | Status: DC

## 2020-04-26 MED ORDER — MIDODRINE HCL 5 MG PO TABS
7.5000 mg | ORAL_TABLET | Freq: Three times a day (TID) | ORAL | Status: DC
  Administered 2020-04-26: 7.5 mg via ORAL
  Filled 2020-04-26: qty 2

## 2020-04-26 MED ORDER — LEVEMIR FLEXTOUCH 100 UNIT/ML ~~LOC~~ SOPN: 15.0000 [IU] | PEN_INJECTOR | Freq: Every day | SUBCUTANEOUS | 3 refills | Status: DC

## 2020-04-26 MED ORDER — HYDROXYZINE HCL 25 MG PO TABS: 25.0000 mg | ORAL_TABLET | Freq: Three times a day (TID) | ORAL | 0 refills | Status: DC | PRN

## 2020-04-26 MED ORDER — ONDANSETRON HCL 4 MG PO TABS: 4.0000 mg | ORAL_TABLET | Freq: Two times a day (BID) | ORAL | Status: DC | PRN

## 2020-04-26 MED ORDER — MIDODRINE HCL 2.5 MG PO TABS: 7.7000 mg | ORAL_TABLET | Freq: Three times a day (TID) | ORAL | Status: DC

## 2020-04-26 NOTE — Progress Notes (Signed)
Physical Therapy Treatment Patient Details Name: [name] MRN: [id number] DOB: 05-Jul-1945 Today's Date: 04/26/2020    History of Present Illness [name] is a 75 y.o. female with medical history significant for schizophrenia, dyslipidemia, chronic pain, GERD, type 2 diabetes, atrial fibrillation on Eliquis, and chronic iron deficiency anemia who was brought to the ED from her skilled facility due to worsening abdominal pain and mild distention.  Patient apparently had nausea and vomiting as well over the last 4 days and it is uncertain whether or not she has been able to keep down her medications.  She appears to have some confusion as well.  She denies any diarrhea or bowel movements in the last few days.  No chest pain or shortness of breath are described.    PT Comments    Patient slightly apprehensive, but agreeable for therapy with encouragement, demonstrates slow labored movement for sitting up at bedside with [facility] raised, very unsteady on feet with difficulty advancing LLE due to foot drop and weakness from old CVA, has to shuffle left foot when taking steps at bedside and limited mostly due to c/o fatigue.  Patient tolerated sitting up in chair after therapy - nursing staff notified.  Patient will benefit from continued physical therapy in hospital and recommended venue below to increase strength, balance, endurance for safe ADLs and gait.    Follow Up Recommendations  Supervision for mobility/OOB;Supervision - Intermittent;SNF     Equipment Recommendations  None recommended by PT    Recommendations for Other Services       Precautions / Restrictions Precautions Precautions: Fall Restrictions Weight Bearing Restrictions: No    Mobility  Bed Mobility Overal bed mobility: Needs Assistance Bed Mobility: Supine to Sit     Supine to sit: Min guard;Min assist;HOB elevated     General bed mobility comments: increased time, labored movement    Transfers Overall  transfer level: Needs assistance Equipment used: Rolling walker (2 wheeled) Transfers: Sit to/from [facility] Sit to Stand: Min assist Stand pivot transfers: Mod assist       General transfer comment: slow labored movement with difficulty advancing LLE due to weakness from old CVA  Ambulation/Gait Ambulation/Gait assistance: Mod assist Gait Distance (Feet): 4 Feet Assistive device: Rolling walker (2 wheeled) Gait Pattern/deviations: Decreased step length - right;Decreased step length - left;Decreased stance time - left;Decreased stride length;Shuffle;Decreased dorsiflexion - left Gait velocity: decreased   General Gait Details: slow labored cadence with mostly shuffling of LLE due to weakness and foot drop   Stairs             Wheelchair Mobility    Modified Rankin (Stroke Patients Only)       Balance Overall balance assessment: Needs assistance Sitting-balance support: Feet supported;No upper extremity supported Sitting balance-Leahy Scale: Good Sitting balance - Comments: seated at EOB   Standing balance support: During functional activity;Bilateral upper extremity supported Standing balance-Leahy Scale: Poor Standing balance comment: fair/poor using RW                            Cognition Arousal/Alertness: Awake/alert Behavior During Therapy: WFL for tasks assessed/performed Overall Cognitive Status: Within Functional Limits for tasks assessed                                        Exercises General Exercises - Lower Extremity  Ankle Circles/Pumps: Seated;AROM;Strengthening;Right;10 reps Long Arc Quad: Seated;AROM;Strengthening;Both;10 reps Hip Flexion/Marching: Seated;AROM;Strengthening;Both;10 reps    General Comments        Pertinent Vitals/Pain Pain Assessment: Faces Faces Pain Scale: Hurts little more Pain Location: stomach Pain Descriptors / Indicators: Aching Pain Intervention(s): Limited  activity within patient's tolerance;Monitored during session;Patient requesting pain meds-RN notified    Home Living                      Prior Function            PT Goals (current goals can now be found in the care plan section) Acute Rehab PT Goals Patient Stated Goal: return home Time For Goal Achievement: 05/08/20 Potential to Achieve Goals: Good Progress towards PT goals: Progressing toward goals    Frequency    Min 3X/week      PT Plan Current plan remains appropriate    Co-evaluation              AM-PAC PT "6 Clicks" Mobility   Outcome Measure  Help needed turning from your back to your side while in a flat bed without using bedrails?: None Help needed moving from lying on your back to sitting on the side of a flat bed without using bedrails?: A Little Help needed moving to and from a bed to a chair (including a wheelchair)?: A Little Help needed standing up from a chair using your arms (e.g., wheelchair or bedside chair)?: A Lot Help needed to walk in hospital room?: A Lot Help needed climbing 3-5 steps with a railing? : A Lot 6 Click Score: 16    End of Session   Activity Tolerance: Patient tolerated treatment well;Patient limited by fatigue Patient left: in chair;with call bell/phone within reach;with chair alarm set Nurse Communication: Mobility status PT Visit Diagnosis: Unsteadiness on feet (R26.81);Other abnormalities of gait and mobility (R26.89);Muscle weakness (generalized) (M62.81)     Time: 2297-9892 PT Time Calculation (min) (ACUTE ONLY): 23 min  Charges:  $Therapeutic Exercise: 8-22 mins $Therapeutic Activity: 8-22 mins                     10:22 AM, 04/26/20 Lonell Grandchild, MPT Physical Therapist with [facility] [phone] office [phone] mobile phone

## 2020-04-26 NOTE — NC FL2 (Signed)
[place] LEVEL OF CARE SCREENING TOOL     IDENTIFICATION  Patient Name: [name] Birthdate: [birth year] Sex: female Admission Date (Current Location): 04/18/2020  [facility] and [place] Number:  [employer] and Address:  [facility] [address] [place]      Provider Number: [id number]  Attending Physician Name and Address:  Murlean Iba, MD  Relative Name and Phone Number:  [name] (Other)   [phone]    Current Level of Care: Hospital Recommended Level of Care: [place] Prior Approval Number:    Date Approved/Denied:   PASRR Number:    Discharge Plan: Other (Comment) (ALF)    Current Diagnoses: Patient Active Problem List   Diagnosis Date Noted  . Hypokalemia   . SBO (small bowel obstruction) ([place]) 04/18/2020  . Chest pain 02/21/2020  . Hemorrhoids 11/26/2019  . Atrial fibrillation with RVR ([place]) 10/04/2019  . GERD (gastroesophageal reflux disease) 08/26/2019  . Loss of weight 08/26/2019  . Constipation 09/09/2018  . Hypotension 03/21/2018  . Diarrhea 07/20/2017  . Primary osteoarthritis of right hip 04/18/2017  . Orthostasis 03/28/2017  . General weakness 03/28/2017  . History of cerebrovascular accident (CVA) with residual deficit 03/28/2017  . Schizophrenia ([place]) 03/28/2017  . Generalized weakness 03/28/2017  . Orthostatic hypotension   . IBS (irritable bowel syndrome) 10/16/2016  . Left hemiparesis ([place]) 10/06/2016  . Left-sided weakness 10/06/2016  . Chronic superficial gastritis without bleeding   . Stricture and stenosis of esophagus   . Nausea with vomiting 04/20/2016  . Abdominal pain, acute, bilateral lower quadrant 03/22/2016  . Lesion of right lobe of liver 03/22/2016  . Elevated troponin 02/13/2016  . IDA (iron deficiency anemia) 04/06/2015  . Mixed hyperlipidemia 11/06/2014  . Obesity due to excess calories 11/06/2014  . Sedentary lifestyle 11/06/2014   . Rectal bleeding 10/21/2014  . Encounter for therapeutic drug monitoring 03/24/2013  . Chronic anticoagulation 04/09/2010  . Type 2 diabetes mellitus with vascular disease ([place]) 03/24/2010  . Essential hypertension, benign 03/24/2010  . DVT, lower extremity, recurrent ([place]) 03/24/2010  . History of cardiovascular disorder 03/24/2010    Orientation RESPIRATION BLADDER Height & Weight     Self,Time,Place,Situation  Normal Incontinent Weight: 127 lb 6.8 oz (57.8 kg) Height:  5\' 4"  (162.6 cm)  BEHAVIORAL SYMPTOMS/MOOD NEUROLOGICAL BOWEL NUTRITION STATUS      Incontinent Diet (Dysphagia 3 with thin liquids. Heart healthy.)  AMBULATORY STATUS COMMUNICATION OF NEEDS Skin   Extensive Assist Verbally Normal                       Personal Care Assistance Level of Assistance  Bathing,Feeding,Dressing Bathing Assistance: Limited assistance Feeding assistance: Limited assistance Dressing Assistance: Limited assistance     Functional Limitations Info  Sight,Hearing,Speech Sight Info: Impaired Hearing Info: Adequate Speech Info: Adequate    SPECIAL CARE FACTORS FREQUENCY                       Contractures Contractures Info: Not present    Additional Factors Info  Code Status,Allergies Code Status Info: Full Allergies Info: Sulfa Antibiotics           Current Medications (04/26/2020):  This is the current hospital active medication list Current Facility-Administered Medications  Medication Dose Route Frequency Provider Last Rate Last Admin  . acetaminophen (TYLENOL) tablet 650 mg  650 mg Oral Q6H PRN Manuella Ghazi, Pratik D, DO   650 mg at  04/23/20 0816   Or  . acetaminophen (TYLENOL) suppository 650 mg  650 mg Rectal Q6H PRN Manuella Ghazi, Pratik D, DO      . apixaban (ELIQUIS) tablet 5 mg  5 mg Oral BID Wynetta Emery, Clanford L, MD   5 mg at 04/26/20 0813  . ARIPiprazole (ABILIFY) tablet 2 mg  2 mg Oral QHS Shah, Pratik D, DO   2 mg at 04/25/20 2201  . atorvastatin (LIPITOR) tablet 40  mg  40 mg Oral QHS Shah, Pratik D, DO   40 mg at 04/25/20 2132  . bisacodyl (DULCOLAX) suppository 10 mg  10 mg Rectal BID AC & HS Aviva Signs, MD   10 mg at 04/25/20 0856  . clonazePAM (KLONOPIN) tablet 0.5 mg  0.5 mg Oral TID PRN Manuella Ghazi, Pratik D, DO   0.5 mg at 04/25/20 0849  . diltiazem (CARDIZEM CD) 24 hr capsule 180 mg  180 mg Oral Daily Johnson, Clanford L, MD   180 mg at 04/26/20 0813  . DULoxetine (CYMBALTA) DR capsule 60 mg  60 mg Oral Daily Manuella Ghazi, Pratik D, DO   60 mg at 04/26/20 2637  . haloperidol lactate (HALDOL) injection 2 mg  2 mg Intravenous Q6H PRN Manuella Ghazi, Pratik D, DO   2 mg at 04/22/20 1312  . HYDROcodone-acetaminophen (NORCO) 10-325 MG per tablet 1 tablet  1 tablet Oral TID Murlean Iba, MD   1 tablet at 04/26/20 0813  . HYDROmorphone (DILAUDID) injection 0.25 mg  0.25 mg Intravenous Q2H PRN Johnson, Clanford L, MD   0.25 mg at 04/23/20 1220  . insulin aspart (novoLOG) injection 0-9 Units  0-9 Units Subcutaneous TID WC Wynetta Emery, Clanford L, MD   2 Units at 04/26/20 0815  . insulin aspart (novoLOG) injection 2 Units  2 Units Subcutaneous TID WC Wynetta Emery, Clanford L, MD   2 Units at 04/26/20 0815  . insulin detemir (LEVEMIR) injection 15 Units  15 Units Subcutaneous QHS Johnson, Clanford L, MD   15 Units at 04/25/20 2202  . labetalol (NORMODYNE) injection 5 mg  5 mg Intravenous Q6H PRN Adefeso, Oladapo, DO      . latanoprost (XALATAN) 0.005 % ophthalmic solution 1 drop  1 drop Both Eyes QHS Shah, Pratik D, DO   1 drop at 04/25/20 2133  . metoprolol succinate (TOPROL-XL) 24 hr tablet 75 mg  75 mg Oral BID Arnoldo Lenis, MD      . midodrine (PROAMATINE) tablet 7.5 mg  7.5 mg Oral TID WC Arnoldo Lenis, MD      . milk and molasses enema  1 enema Rectal BID PRN Aviva Signs, MD   240 mL at 04/19/20 0930  . mirabegron ER (MYRBETRIQ) tablet 25 mg  25 mg Oral Daily Manuella Ghazi, Pratik D, DO   25 mg at 04/26/20 0813  . mirtazapine (REMERON) tablet 30 mg  30 mg Oral QHS Shah, Pratik  D, DO   30 mg at 04/25/20 2132  . naphazoline-glycerin (CLEAR EYES REDNESS) ophth solution 1-2 drop  1-2 drop Both Eyes QID PRN Manuella Ghazi, Pratik D, DO   1 drop at 04/24/20 1110  . ondansetron (ZOFRAN) tablet 4 mg  4 mg Oral Q6H PRN Manuella Ghazi, Pratik D, DO   4 mg at 04/21/20 1858   Or  . ondansetron [facility]) injection 4 mg  4 mg Intravenous Q6H PRN Manuella Ghazi, Pratik D, DO   4 mg at 04/25/20 1251  . pantoprazole (PROTONIX) EC tablet 40 mg  40 mg Oral Q0600 Johnson, Clanford L,  MD   40 mg at 04/26/20 0515  . polyethylene glycol (MIRALAX / GLYCOLAX) packet 17 g  17 g Oral BID Aviva Signs, MD   17 g at 04/26/20 0813  . potassium chloride (KLOR-CON) CR tablet 10 mEq  10 mEq Oral Daily Johnson, Clanford L, MD   10 mEq at 04/26/20 0813  . prochlorperazine (COMPAZINE) tablet 10 mg  10 mg Oral Q6H PRN Adefeso, Oladapo, DO   10 mg at 04/22/20 1044  . risperiDONE (RISPERDAL) tablet 1 mg  1 mg Oral BID Manuella Ghazi, Pratik D, DO   1 mg at 04/26/20 0813  . senna (SENOKOT) tablet 8.6 mg  1 tablet Oral QHS Johnson, Clanford L, MD   8.6 mg at 04/25/20 2132     Discharge Medications: Medication List    STOP taking these medications   Ferrex 150 150 MG capsule Generic drug: iron polysaccharides   simethicone 80 MG chewable tablet Commonly known as: MYLICON   simvastatin 20 MG tablet Commonly known as: ZOCOR   tiZANidine 2 MG tablet Commonly known as: ZANAFLEX     TAKE these medications   Abilify 2 MG tablet Generic drug: ARIPiprazole Take 1 tablet (2 mg total) by mouth at bedtime.   acetaminophen 325 MG tablet Commonly known as: TYLENOL Take 650 mg by mouth 3 (three) times daily as needed for mild pain or moderate pain.   apixaban 5 MG Tabs tablet Commonly known as: ELIQUIS Take 1 tablet (5 mg total) by mouth 2 (two) times daily.   ARTIFICIAL TEARS OP Place 1 drop into both eyes 3 (three) times daily.   atorvastatin 40 MG tablet Commonly known as: LIPITOR Take 1 tablet (40 mg total) by mouth at  bedtime.   calcium carbonate 500 MG chewable tablet Commonly known as: TUMS - dosed in mg elemental calcium Chew 1 tablet by mouth 4 (four) times daily as needed for heartburn.   clonazePAM 0.5 MG tablet Commonly known as: KLONOPIN Take 1 tablet (0.5 mg total) by mouth 3 (three) times daily as needed for anxiety.   diltiazem 180 MG 24 hr capsule Commonly known as: Cardizem CD Take 1 capsule (180 mg total) by mouth daily. What changed:   medication strength  how much to take   DULoxetine 60 MG capsule Commonly known as: CYMBALTA Take 60 mg by mouth daily.   gabapentin 300 MG capsule Commonly known as: NEURONTIN Take 1 capsule (300 mg total) by mouth 3 (three) times daily as needed.   HYDROcodone-acetaminophen 10-325 MG tablet Commonly known as: NORCO Take 1 tablet by mouth 3 (three) times daily as needed for severe pain. What changed: reasons to take this   hydrocortisone 2.5 % rectal cream Commonly known as: ANUSOL-HC Place 1 application rectally 2 (two) times daily as needed (for rectal bleeding).   hydrOXYzine 25 MG tablet Commonly known as: ATARAX/VISTARIL Take 1 tablet (25 mg total) by mouth every 8 (eight) hours as needed for anxiety or itching. What changed:   when to take this  reasons to take this   latanoprost 0.005 % ophthalmic solution Commonly known as: XALATAN Place 1 drop into both eyes at bedtime.   Levemir FlexTouch 100 UNIT/ML FlexPen Generic drug: insulin detemir Inject 15 Units into the skin at bedtime. What changed: how much to take   loratadine 10 MG tablet Commonly known as: CLARITIN Take 10 mg by mouth daily.   lubiprostone 24 MCG capsule Commonly known as: AMITIZA Take 1 capsule (24 mcg total) by mouth 2 (  two) times daily with a meal.   metoprolol succinate 25 MG 24 hr tablet Commonly known as: TOPROL-XL Take 3 tablets (75 mg total) by mouth 2 (two) times daily. What changed:   how much to take  when to take  this   midodrine 2.5 MG tablet Commonly known as: PROAMATINE Take 3 tablets (7.5 mg total) by mouth 3 (three) times daily. What changed:   medication strength  See the new instructions.   mirabegron ER 25 MG Tb24 tablet Commonly known as: MYRBETRIQ Take 25 mg by mouth daily.   mirtazapine 30 MG tablet Commonly known as: REMERON Take 30 mg by mouth at bedtime.   ondansetron 4 MG tablet Commonly known as: ZOFRAN Take 1 tablet (4 mg total) by mouth 2 (two) times daily as needed for nausea or vomiting.   pantoprazole 40 MG tablet Commonly known as: PROTONIX Take 1 tablet (40 mg total) by mouth daily.   polyethylene glycol 17 g packet Commonly known as: MIRALAX / GLYCOLAX Take 17 g by mouth daily.   risperiDONE 1 MG tablet Commonly known as: RISPERDAL Take 1 tablet (1 mg total) by mouth in the morning and at bedtime.   senna 8.6 MG Tabs tablet Commonly known as: SENOKOT Take 1 tablet (8.6 mg total) by mouth daily.   Tradjenta 5 MG Tabs tablet Generic drug: linagliptin TAKE (1) TABLET BY MOUTH ONCE DAILY. What changed: See the new instructions.   Vitamin D3 50 MCG (2000 UT) Tabs Take 2,000 Units by mouth daily.   zolpidem 5 MG tablet Commonly known as: AMBIEN Take 1 tablet (5 mg total) by mouth at bedtime as needed for sleep. What changed:   medication strength  how much to take      Relevant Imaging Results:  Relevant Lab Results:   Additional Information Pt SSN: [id number]  Salome Arnt, LCSW

## 2020-04-26 NOTE — TOC Transition Note (Signed)
Transition of Care [facility]) - CM/SW Discharge Note   Patient Details  Name: [name] MRN: [id number] Date of Birth: 05-23-1945  Transition of Care [facility] CM/SW Contact:  Salome Arnt, [place] Phone Number: 04/26/2020, 1:04 PM   Clinical Narrative:  Pt d/c today back to [facility]. Pt's guardian, [name] with [employer] notified by voicemail. LCSW spoke with [name] at [facility] and notified her that pt is ready this afternoon. Facility to transport. D/C summary and updated/signed FL2 faxed to facility.     Final next level of care: Assisted Living Barriers to Discharge: Barriers Resolved   Patient Goals and CMS Choice        Discharge Placement                Patient to be transferred to facility by: ALF Name of family member notified: Guardian- Almyra Free Patient and family notified of of transfer: 04/26/20  Discharge Plan and Services In-house Referral: Clinical Social Work              DME Arranged: N/A DME Agency: NA                  Social Determinants of Health (SDOH) Interventions     Readmission Risk Interventions No flowsheet data found.

## 2020-04-26 NOTE — Discharge Summary (Signed)
Physician Discharge Summary  [name] ZOX:[id number] DOB: [birth year] DOA: 04/18/2020  PCP: [name], MD Cardiology: Dennard Schaumann date: 04/18/2020 Discharge date: 04/26/2020  Admitted From:  [facility] Disposition:  [facility]  Recommendations for Outpatient Follow-up:  1. Follow up with cardiology in 2 weeks 2. Establish care with endocrinologist in 2-3 weeks to have thyroid function retested 3. Bleeding precautions while on full anticoagulation therapy 4. Monitor blood glucose at least 3 times per day and per protocol.  5. Titrate insulin dose as needed for better glycemic control.   Discharge Condition: STABLE   CODE STATUS: FULL DIET: soft foods,  Heart healthy   Brief Hospitalization Summary: Please see all hospital notes, images, labs for full details of the hospitalization. ADMISSION HPI: [name] is a 75 y.o. female with medical history significant for schizophrenia, dyslipidemia, chronic pain, GERD, type 2 diabetes, atrial fibrillation on Eliquis, and chronic iron deficiency anemia who was brought to the ED from her skilled facility due to worsening abdominal pain and mild distention.  Patient apparently had nausea and vomiting as well over the last 4 days and it is uncertain whether or not she has been able to keep down her medications.  She appears to have some confusion as well.  She denies any diarrhea or bowel movements in the last few days.  No chest pain or shortness of breath are described.   ED Course: Patient noted to have significant tachyarrhythmia with atrial fibrillation/flutter noted on EKG.  Blood pressure stable and patient is afebrile.  Laboratory data with potassium of 3.0.  CT of the abdomen and pelvis demonstrating partial to developing SBO with fecal impaction.  ECG does not appear impacted on rectal examination per EDP and stool occult testing is negative.  She has been started on IV fluid as well as some potassium supplementation.  Cardizem  drip has also been initiated for rate control.   Hospital Course:    Partial SBO-resolved now -Advanced to soft diet and tolerating, having BMs -Replete as needed for goal potassium of 4 and magnesium of 2 -Appreciate general surgery evaluation and recommendations -Zofran as needed for nausea and vomiting -Plan discharge back to [facility] on Monday 4/4  Atrial fibrillation with RVR-much better controlled.    - Transferred to telemetry and OFF IV cardizem infusion - Final cardiology recommendations:  cardizem CD 180 mg daily, metoprolol XL 75 mg BID, Midodrine 7.5 mg TID.    - Appreciate cardiology consult for assistance with controlling HR in setting of soft BPs.  -Continue Eliquis (Apixaban 5 mg BID) for full anticoagulation   Type 2 diabetes, uncontrolled -Prior hemoglobin A1c 9.2% on 01/2020 -Blood glucose currently stable, reduced levemir to 15 units CBG (last 3)  Recent Labs    04/25/20 1847 04/25/20 2151 04/26/20 0721  GLUCAP 143* 116* 166*    GERD -PPI daily for GI protection  History of schizophrenia -Resumed home oral medications  Chronic Back Pain -resumed home medications for chronic pain with precautions  Iron deficiency anemia-stable -Currently appears stable with no other bleeding -Follow CBC --->STABLE Hg -DC iron supplementation -Stool occult negative for bleeding per EDP  Low TSH -Free T4 mildly elevated at 1.52 -Recommend endocrinology follow-up outpatient -Ambulatory referral made for outpatient endocrinology follow up  DVT prophylaxis: apixaban  Code Status:Full Family Communication:Tried calling guardian [name] with no response multiple times Disposition Plan:return to [facility] on Monday or Tuesday (Facility cannot take patient until 4/4)  Status is: Inpatient  Discharge Diagnoses:  Active Problems:   SBO (small bowel obstruction) (HCC)   Hypokalemia  Discharge Instructions: Discharge Instructions     Ambulatory referral to Endocrinology   Complete by: As directed      Allergies as of 04/26/2020      Reactions   Sulfa Antibiotics Rash      Medication List    STOP taking these medications   Ferrex 150 150 MG capsule Generic drug: iron polysaccharides   simethicone 80 MG chewable tablet Commonly known as: MYLICON   simvastatin 20 MG tablet Commonly known as: ZOCOR   tiZANidine 2 MG tablet Commonly known as: ZANAFLEX     TAKE these medications   Abilify 2 MG tablet Generic drug: ARIPiprazole Take 1 tablet (2 mg total) by mouth at bedtime.   acetaminophen 325 MG tablet Commonly known as: TYLENOL Take 650 mg by mouth 3 (three) times daily as needed for mild pain or moderate pain.   apixaban 5 MG Tabs tablet Commonly known as: ELIQUIS Take 1 tablet (5 mg total) by mouth 2 (two) times daily.   ARTIFICIAL TEARS OP Place 1 drop into both eyes 3 (three) times daily.   atorvastatin 40 MG tablet Commonly known as: LIPITOR Take 1 tablet (40 mg total) by mouth at bedtime.   calcium carbonate 500 MG chewable tablet Commonly known as: TUMS - dosed in mg elemental calcium Chew 1 tablet by mouth 4 (four) times daily as needed for heartburn.   clonazePAM 0.5 MG tablet Commonly known as: KLONOPIN Take 1 tablet (0.5 mg total) by mouth 3 (three) times daily as needed for anxiety.   diltiazem 180 MG 24 hr capsule Commonly known as: Cardizem CD Take 1 capsule (180 mg total) by mouth daily. What changed:   medication strength  how much to take   DULoxetine 60 MG capsule Commonly known as: CYMBALTA Take 60 mg by mouth daily.   gabapentin 300 MG capsule Commonly known as: NEURONTIN Take 1 capsule (300 mg total) by mouth 3 (three) times daily as needed.   HYDROcodone-acetaminophen 10-325 MG tablet Commonly known as: NORCO Take 1 tablet by mouth 3 (three) times daily as needed for severe pain. What changed: reasons to take this   hydrocortisone 2.5 % rectal  cream Commonly known as: ANUSOL-HC Place 1 application rectally 2 (two) times daily as needed (for rectal bleeding).   hydrOXYzine 25 MG tablet Commonly known as: ATARAX/VISTARIL Take 1 tablet (25 mg total) by mouth every 8 (eight) hours as needed for anxiety or itching. What changed:   when to take this  reasons to take this   latanoprost 0.005 % ophthalmic solution Commonly known as: XALATAN Place 1 drop into both eyes at bedtime.   Levemir FlexTouch 100 UNIT/ML FlexPen Generic drug: insulin detemir Inject 15 Units into the skin at bedtime. What changed: how much to take   loratadine 10 MG tablet Commonly known as: CLARITIN Take 10 mg by mouth daily.   lubiprostone 24 MCG capsule Commonly known as: AMITIZA Take 1 capsule (24 mcg total) by mouth 2 (two) times daily with a meal.   metoprolol succinate 25 MG 24 hr tablet Commonly known as: TOPROL-XL Take 3 tablets (75 mg total) by mouth 2 (two) times daily. What changed:   how much to take  when to take this   midodrine 2.5 MG tablet Commonly known as: PROAMATINE Take 3 tablets (7.5 mg total) by mouth 3 (three) times daily. What changed:   medication strength  See  the new instructions.   mirabegron ER 25 MG Tb24 tablet Commonly known as: MYRBETRIQ Take 25 mg by mouth daily.   mirtazapine 30 MG tablet Commonly known as: REMERON Take 30 mg by mouth at bedtime.   ondansetron 4 MG tablet Commonly known as: ZOFRAN Take 1 tablet (4 mg total) by mouth 2 (two) times daily as needed for nausea or vomiting.   pantoprazole 40 MG tablet Commonly known as: PROTONIX Take 1 tablet (40 mg total) by mouth daily.   polyethylene glycol 17 g packet Commonly known as: MIRALAX / GLYCOLAX Take 17 g by mouth daily.   risperiDONE 1 MG tablet Commonly known as: RISPERDAL Take 1 tablet (1 mg total) by mouth in the morning and at bedtime.   senna 8.6 MG Tabs tablet Commonly known as: SENOKOT Take 1 tablet (8.6 mg total)  by mouth daily.   Tradjenta 5 MG Tabs tablet Generic drug: linagliptin TAKE (1) TABLET BY MOUTH ONCE DAILY. What changed: See the new instructions.   Vitamin D3 50 MCG (2000 UT) Tabs Take 2,000 Units by mouth daily.   zolpidem 5 MG tablet Commonly known as: AMBIEN Take 1 tablet (5 mg total) by mouth at bedtime as needed for sleep. What changed:   medication strength  how much to take       Follow-up Information    [name], MD Follow up in 2 week(s).   Specialty: Internal Medicine Contact information: [place] [address] [phone]        Satira Sark, MD. Schedule an appointment as soon as possible for a visit in 2 week(s).   Specialty: Cardiology Contact information: [place] [address] [phone]              Allergies  Allergen Reactions  . Sulfa Antibiotics Rash   Allergies as of 04/26/2020      Reactions   Sulfa Antibiotics Rash      Medication List    STOP taking these medications   Ferrex 150 150 MG capsule Generic drug: iron polysaccharides   simethicone 80 MG chewable tablet Commonly known as: MYLICON   simvastatin 20 MG tablet Commonly known as: ZOCOR   tiZANidine 2 MG tablet Commonly known as: ZANAFLEX     TAKE these medications   Abilify 2 MG tablet Generic drug: ARIPiprazole Take 1 tablet (2 mg total) by mouth at bedtime.   acetaminophen 325 MG tablet Commonly known as: TYLENOL Take 650 mg by mouth 3 (three) times daily as needed for mild pain or moderate pain.   apixaban 5 MG Tabs tablet Commonly known as: ELIQUIS Take 1 tablet (5 mg total) by mouth 2 (two) times daily.   ARTIFICIAL TEARS OP Place 1 drop into both eyes 3 (three) times daily.   atorvastatin 40 MG tablet Commonly known as: LIPITOR Take 1 tablet (40 mg total) by mouth at bedtime.   calcium carbonate 500 MG chewable tablet Commonly known as: TUMS - dosed in mg elemental calcium Chew 1 tablet  by mouth 4 (four) times daily as needed for heartburn.   clonazePAM 0.5 MG tablet Commonly known as: KLONOPIN Take 1 tablet (0.5 mg total) by mouth 3 (three) times daily as needed for anxiety.   diltiazem 180 MG 24 hr capsule Commonly known as: Cardizem CD Take 1 capsule (180 mg total) by mouth daily. What changed:   medication strength  how much to take   DULoxetine 60 MG capsule Commonly known as:  CYMBALTA Take 60 mg by mouth daily.   gabapentin 300 MG capsule Commonly known as: NEURONTIN Take 1 capsule (300 mg total) by mouth 3 (three) times daily as needed.   HYDROcodone-acetaminophen 10-325 MG tablet Commonly known as: NORCO Take 1 tablet by mouth 3 (three) times daily as needed for severe pain. What changed: reasons to take this   hydrocortisone 2.5 % rectal cream Commonly known as: ANUSOL-HC Place 1 application rectally 2 (two) times daily as needed (for rectal bleeding).   hydrOXYzine 25 MG tablet Commonly known as: ATARAX/VISTARIL Take 1 tablet (25 mg total) by mouth every 8 (eight) hours as needed for anxiety or itching. What changed:   when to take this  reasons to take this   latanoprost 0.005 % ophthalmic solution Commonly known as: XALATAN Place 1 drop into both eyes at bedtime.   Levemir FlexTouch 100 UNIT/ML FlexPen Generic drug: insulin detemir Inject 15 Units into the skin at bedtime. What changed: how much to take   loratadine 10 MG tablet Commonly known as: CLARITIN Take 10 mg by mouth daily.   lubiprostone 24 MCG capsule Commonly known as: AMITIZA Take 1 capsule (24 mcg total) by mouth 2 (two) times daily with a meal.   metoprolol succinate 25 MG 24 hr tablet Commonly known as: TOPROL-XL Take 3 tablets (75 mg total) by mouth 2 (two) times daily. What changed:   how much to take  when to take this   midodrine 2.5 MG tablet Commonly known as: PROAMATINE Take 3 tablets (7.5 mg total) by mouth 3 (three) times daily. What changed:    medication strength  See the new instructions.   mirabegron ER 25 MG Tb24 tablet Commonly known as: MYRBETRIQ Take 25 mg by mouth daily.   mirtazapine 30 MG tablet Commonly known as: REMERON Take 30 mg by mouth at bedtime.   ondansetron 4 MG tablet Commonly known as: ZOFRAN Take 1 tablet (4 mg total) by mouth 2 (two) times daily as needed for nausea or vomiting.   pantoprazole 40 MG tablet Commonly known as: PROTONIX Take 1 tablet (40 mg total) by mouth daily.   polyethylene glycol 17 g packet Commonly known as: MIRALAX / GLYCOLAX Take 17 g by mouth daily.   risperiDONE 1 MG tablet Commonly known as: RISPERDAL Take 1 tablet (1 mg total) by mouth in the morning and at bedtime.   senna 8.6 MG Tabs tablet Commonly known as: SENOKOT Take 1 tablet (8.6 mg total) by mouth daily.   Tradjenta 5 MG Tabs tablet Generic drug: linagliptin TAKE (1) TABLET BY MOUTH ONCE DAILY. What changed: See the new instructions.   Vitamin D3 50 MCG (2000 UT) Tabs Take 2,000 Units by mouth daily.   zolpidem 5 MG tablet Commonly known as: AMBIEN Take 1 tablet (5 mg total) by mouth at bedtime as needed for sleep. What changed:   medication strength  how much to take       Procedures/Studies: CT Abdomen Pelvis W Contrast  Result Date: 04/18/2020 CLINICAL DATA:  Nausea, vomiting, abdominal pain EXAM: CT ABDOMEN AND PELVIS WITH CONTRAST TECHNIQUE: Multidetector CT imaging of the abdomen and pelvis was performed using the standard protocol following bolus administration of intravenous contrast. CONTRAST:  127mL OMNIPAQUE IOHEXOL 300 MG/ML  SOLN COMPARISON:  03/28/2017 FINDINGS: Lower chest: No acute abnormality. Hepatobiliary: No solid liver abnormality is seen. Definitively benign subcapsular hemangioma of the right lobe of the liver, unchanged (series 2, image 21). No gallstones, gallbladder wall thickening, or biliary  dilatation. Pancreas: Unremarkable. No pancreatic ductal dilatation or  surrounding inflammatory changes. Spleen: Normal in size without significant abnormality. Adrenals/Urinary Tract: Adrenal glands are unremarkable. Kidneys are normal, without renal calculi, solid lesion, or hydronephrosis. Bladder is unremarkable. Stomach/Bowel: The stomach is fluid filled and the proximal small bowel is mildly distended, largest loops measuring up to 3.8 cm. There is an abrupt caliber change of the mid to distal small bowel in the midline ventral abdomen (series 2, image 53, series 5, image 43), with relatively decompressed, although still gas and fluid containing distal small bowel. Appendix appears normal. Large burden of stool and stool balls throughout the colon with numerous large stool balls in the rectum. Vascular/Lymphatic: Aortic atherosclerosis. No enlarged abdominal or pelvic lymph nodes. Reproductive: No mass or other significant abnormality. Other: No abdominal wall hernia or abnormality. Small volume ascites throughout the abdomen. Musculoskeletal: No acute or significant osseous findings. IMPRESSION: 1. The stomach is fluid filled and the proximal small bowel is mildly distended. There is an abrupt caliber change of the mid to distal small bowel in the midline ventral abdomen, with relatively decompressed, although still gas and fluid containing distal small bowel. Findings are concerning for partial or developing small bowel obstruction. 2. Large burden of stool and stool balls throughout the colon with numerous large stool balls in the rectum. Correlate for fecal impaction. 3. Small volume ascites. Electronically Signed   By: Eddie Candle M.D.   On: 04/18/2020 12:23      Subjective: Pt without complaints today.    Discharge Exam: Vitals:   04/25/20 2132 04/26/20 0522  BP: 104/60 (!) 111/54  Pulse: 67 (!) 107  Resp: 18 20  Temp: 97.7 F (36.5 C) 98.1 F (36.7 C)  SpO2: 97% 100%   Vitals:   04/25/20 0530 04/25/20 1408 04/25/20 2132 04/26/20 0522  BP: (!) 112/56  (!) 110/47 104/60 (!) 111/54  Pulse: 68 63 67 (!) 107  Resp: 20 20 18 20   Temp: 98.7 F (37.1 C) 98 F (36.7 C) 97.7 F (36.5 C) 98.1 F (36.7 C)  TempSrc: Oral Oral Oral Oral  SpO2: 98% 98% 97% 100%  Weight:      Height:       General exam: elderly frail female with confusion, edentulous.  NAD.  Respiratory system: Clear to auscultation. Respiratory effort normal. Cardiovascular system:  irregularly irregular.    Gastrointestinal system:  Soft, nondistended, nontender, no masses palpated.   Central nervous system:  awake, alert, moving all extremities. Extremities: no CCE Skin: No significant lesions noted Psychiatry: Flat affect.   The results of significant diagnostics from this hospitalization (including imaging, microbiology, ancillary and laboratory) are listed below for reference.     Microbiology: Recent Results (from the past 240 hour(s))  Resp Panel by RT-PCR (Flu A&B, Covid) Nasopharyngeal Swab     Status: None   Collection Time: 04/18/20  1:30 PM   Specimen: Nasopharyngeal Swab; Nasopharyngeal(NP) swabs in vial transport medium  Result Value Ref Range Status   SARS Coronavirus 2 by RT PCR NEGATIVE NEGATIVE Final    Comment: (NOTE) SARS-CoV-2 target nucleic acids are NOT DETECTED.  The SARS-CoV-2 RNA is generally detectable in upper respiratory specimens during the acute phase of infection. The lowest concentration of SARS-CoV-2 viral copies this assay can detect is 138 copies/mL. A negative result does not preclude SARS-Cov-2 infection and should not be used as the sole basis for treatment or other patient management decisions. A negative result may occur with  improper specimen  collection/handling, submission of specimen other than nasopharyngeal swab, presence of viral mutation(s) within the areas targeted by this assay, and inadequate number of viral copies(<138 copies/mL). A negative result must be combined with clinical observations, patient history, and  epidemiological information. The expected result is Negative.  Fact Sheet for Patients:  EntrepreneurPulse.com.au  Fact Sheet for Healthcare Providers:  IncredibleEmployment.be  This test is no t yet approved or cleared by the [place] FDA and  has been authorized for detection and/or diagnosis of SARS-CoV-2 by FDA under an Emergency Use Authorization (EUA). This EUA will remain  in effect (meaning this test can be used) for the duration of the COVID-19 declaration under Section 564(b)(1) of the Act, 21 U.S.C.section 360bbb-3(b)(1), unless the authorization is terminated  or revoked sooner.       Influenza A by PCR NEGATIVE NEGATIVE Final   Influenza B by PCR NEGATIVE NEGATIVE Final    Comment: (NOTE) The Xpert Xpress SARS-CoV-2/FLU/RSV plus assay is intended as an aid in the diagnosis of influenza from Nasopharyngeal swab specimens and should not be used as a sole basis for treatment. Nasal washings and aspirates are unacceptable for Xpert Xpress SARS-CoV-2/FLU/RSV testing.  Fact Sheet for Patients: EntrepreneurPulse.com.au  Fact Sheet for Healthcare Providers: IncredibleEmployment.be  This test is not yet approved or cleared by the [place] FDA and has been authorized for detection and/or diagnosis of SARS-CoV-2 by FDA under an Emergency Use Authorization (EUA). This EUA will remain in effect (meaning this test can be used) for the duration of the COVID-19 declaration under Section 564(b)(1) of the Act, 21 U.S.C. section 360bbb-3(b)(1), unless the authorization is terminated or revoked.  Performed at [facility], [address] [place], [place] [address]   MRSA PCR Screening     Status: None   Collection Time: 04/18/20  8:47 PM   Specimen: Nasopharyngeal  Result Value Ref Range Status   MRSA by PCR NEGATIVE NEGATIVE Final    Comment:        The GeneXpert MRSA Assay (FDA approved for  NASAL specimens only), is one component of a comprehensive MRSA colonization surveillance program. It is not intended to diagnose MRSA infection nor to guide or monitor treatment for MRSA infections. Performed at [facility], [address] [place], [place] [address]      Labs: BNP (last 3 results) Recent Labs    10/04/19 1232  BNP 784.6*   Basic Metabolic Panel: Recent Labs  Lab 04/20/20 0534 04/21/20 0512 04/22/20 0608 04/23/20 0357 04/24/20 0646 04/25/20 0618  NA 135 137 139 139 135 137  K 3.5 3.2* 3.4* 3.9 4.3 4.2  CL 105 104 104 103 100 100  CO2 20* 23 27 29 26 27   GLUCOSE 169* 174* 83 114* 173* 182*  BUN 11 9 10  7* 8 10  CREATININE 0.58 0.57 0.65 0.73 0.62 0.78  CALCIUM 7.6* 7.8* 8.0* 8.3* 8.5* 8.7*  MG 2.0 2.0  --  2.0 2.0 2.0   Liver Function Tests: Recent Labs  Lab 04/20/20 0534 04/21/20 0512  AST 17 17  ALT 13 13  ALKPHOS 47 48  BILITOT 0.8 1.0  PROT 5.8* 5.9*  ALBUMIN 2.9* 2.8*   No results for input(s): LIPASE, AMYLASE in the last 168 hours. No results for input(s): AMMONIA in the last 168 hours. CBC: Recent Labs  Lab 04/20/20 0534 04/21/20 0512 04/22/20 0608 04/23/20 0357  WBC 7.9 6.2 6.4 6.6  HGB 11.5* 12.3 11.9* 13.0  HCT 34.5* 36.3 35.8* 39.2  MCV  88.9 88.3 89.5 89.9  PLT 201 223 229 262   Cardiac Enzymes: No results for input(s): CKTOTAL, CKMB, CKMBINDEX, TROPONINI in the last 168 hours. BNP: Invalid input(s): POCBNP CBG: Recent Labs  Lab 04/25/20 1102 04/25/20 1702 04/25/20 1847 04/25/20 2151 04/26/20 0721  GLUCAP 202* 152* 143* 116* 166*   D-Dimer No results for input(s): DDIMER in the last 72 hours. Hgb A1c No results for input(s): HGBA1C in the last 72 hours. Lipid Profile No results for input(s): CHOL, HDL, LDLCALC, TRIG, CHOLHDL, LDLDIRECT in the last 72 hours. Thyroid function studies No results for input(s): TSH, T4TOTAL, T3FREE, THYROIDAB in the last 72 hours.  Invalid input(s): FREET3 Anemia work  up No results for input(s): VITAMINB12, FOLATE, FERRITIN, TIBC, IRON, RETICCTPCT in the last 72 hours. Urinalysis    Component Value Date/Time   COLORURINE YELLOW 04/18/2020 2157   APPEARANCEUR CLEAR 04/18/2020 2157   APPEARANCEUR Cloudy (A) 05/01/2016 1525   LABSPEC >1.046 (H) 04/18/2020 2157   PHURINE 5.0 04/18/2020 2157   GLUCOSEU NEGATIVE 04/18/2020 2157   HGBUR NEGATIVE 04/18/2020 2157   BILIRUBINUR NEGATIVE 04/18/2020 2157   BILIRUBINUR Negative 05/01/2016 1525   KETONESUR 80 (A) 04/18/2020 2157   PROTEINUR NEGATIVE 04/18/2020 2157   UROBILINOGEN 0.2 09/14/2014 1703   NITRITE NEGATIVE 04/18/2020 2157   LEUKOCYTESUR NEGATIVE 04/18/2020 2157   Sepsis Labs Invalid input(s): PROCALCITONIN,  WBC,  LACTICIDVEN Microbiology Recent Results (from the past 240 hour(s))  Resp Panel by RT-PCR (Flu A&B, Covid) Nasopharyngeal Swab     Status: None   Collection Time: 04/18/20  1:30 PM   Specimen: Nasopharyngeal Swab; Nasopharyngeal(NP) swabs in vial transport medium  Result Value Ref Range Status   SARS Coronavirus 2 by RT PCR NEGATIVE NEGATIVE Final    Comment: (NOTE) SARS-CoV-2 target nucleic acids are NOT DETECTED.  The SARS-CoV-2 RNA is generally detectable in upper respiratory specimens during the acute phase of infection. The lowest concentration of SARS-CoV-2 viral copies this assay can detect is 138 copies/mL. A negative result does not preclude SARS-Cov-2 infection and should not be used as the sole basis for treatment or other patient management decisions. A negative result may occur with  improper specimen collection/handling, submission of specimen other than nasopharyngeal swab, presence of viral mutation(s) within the areas targeted by this assay, and inadequate number of viral copies(<138 copies/mL). A negative result must be combined with clinical observations, patient history, and epidemiological information. The expected result is Negative.  Fact Sheet for  Patients:  EntrepreneurPulse.com.au  Fact Sheet for Healthcare Providers:  IncredibleEmployment.be  This test is no t yet approved or cleared by the [place] FDA and  has been authorized for detection and/or diagnosis of SARS-CoV-2 by FDA under an Emergency Use Authorization (EUA). This EUA will remain  in effect (meaning this test can be used) for the duration of the COVID-19 declaration under Section 564(b)(1) of the Act, 21 U.S.C.section 360bbb-3(b)(1), unless the authorization is terminated  or revoked sooner.       Influenza A by PCR NEGATIVE NEGATIVE Final   Influenza B by PCR NEGATIVE NEGATIVE Final    Comment: (NOTE) The Xpert Xpress SARS-CoV-2/FLU/RSV plus assay is intended as an aid in the diagnosis of influenza from Nasopharyngeal swab specimens and should not be used as a sole basis for treatment. Nasal washings and aspirates are unacceptable for Xpert Xpress SARS-CoV-2/FLU/RSV testing.  Fact Sheet for Patients: EntrepreneurPulse.com.au  Fact Sheet for Healthcare Providers: IncredibleEmployment.be  This test is not yet approved or cleared by  the [employer] and has been authorized for detection and/or diagnosis of SARS-CoV-2 by FDA under an Emergency Use Authorization (EUA). This EUA will remain in effect (meaning this test can be used) for the duration of the COVID-19 declaration under Section 564(b)(1) of the Act, 21 U.S.C. section 360bbb-3(b)(1), unless the authorization is terminated or revoked.  Performed at [facility], [address], [place], [place] [address]   MRSA PCR Screening     Status: None   Collection Time: 04/18/20  8:47 PM   Specimen: Nasopharyngeal  Result Value Ref Range Status   MRSA by PCR NEGATIVE NEGATIVE Final    Comment:        The GeneXpert MRSA Assay (FDA approved for NASAL specimens only), is one component of a comprehensive MRSA  colonization surveillance program. It is not intended to diagnose MRSA infection nor to guide or monitor treatment for MRSA infections. Performed at [facility], [address] [place], [place] [address]     Time coordinating discharge: 40 mins  SIGNED:  Irwin Brakeman, MD  [facility] 04/26/2020, 11:15 AM How to contact the [facility] Attending or Consulting provider [place] or covering provider during after hours [place], for this patient?  1. Check the care team in [facility] and look for a) attending/consulting TR[facility] provider listed and b) the [facility] team listed 2. Log into [email or web address] and use [place] universal password to access. If you do not have the password, please contact the hospital operator. 3. Locate the [facility] provider you are looking for under [facility] and page to a number that you can be directly reached. 4. If you still have difficulty reaching the provider, please page the [facility] (Director on Call) for the Hospitalists listed on amion for assistance.

## 2020-04-26 NOTE — Care Management Important Message (Signed)
Important Message  Patient Details  Name: [name] MRN: [id number] Date of Birth: July 11, 1945   Medicare Important Message Given:  Yes (copy provided to patient, copy mailed to legal guardian at [address] [place], [place], [place] [address])     [name] 04/26/2020, 12:46 PM

## 2020-04-26 NOTE — Progress Notes (Addendum)
Progress Note  Patient Name: [name] Date of Encounter: 04/26/2020  [facility] Cardiologist: Rozann Lesches, MD   Subjective   No complaints  Inpatient Medications    Scheduled Meds: . apixaban  5 mg Oral BID  . ARIPiprazole  2 mg Oral QHS  . atorvastatin  40 mg Oral QHS  . bisacodyl  10 mg Rectal BID AC & HS  . diltiazem  180 mg Oral Daily  . DULoxetine  60 mg Oral Daily  . HYDROcodone-acetaminophen  1 tablet Oral TID  . insulin aspart  0-9 Units Subcutaneous TID WC  . insulin aspart  2 Units Subcutaneous TID WC  . insulin detemir  15 Units Subcutaneous QHS  . latanoprost  1 drop Both Eyes QHS  . metoprolol succinate  50 mg Oral BID  . midodrine  5 mg Oral TID WC  . mirabegron ER  25 mg Oral Daily  . mirtazapine  30 mg Oral QHS  . pantoprazole  40 mg Oral Q0600  . polyethylene glycol  17 g Oral BID  . potassium chloride  10 mEq Oral Daily  . risperiDONE  1 mg Oral BID  . senna  1 tablet Oral QHS   Continuous Infusions:  PRN Meds: acetaminophen **OR** acetaminophen, clonazePAM, haloperidol lactate, HYDROmorphone (DILAUDID) injection, labetalol, milk and molasses, naphazoline-glycerin, ondansetron **OR** ondansetron (ZOFRAN) IV, prochlorperazine   Vital Signs    Vitals:   04/25/20 0530 04/25/20 1408 04/25/20 2132 04/26/20 0522  BP: (!) 112/56 (!) 110/47 104/60 (!) 111/54  Pulse: 68 63 67 (!) 107  Resp: 20 20 18 20   Temp: 98.7 F (37.1 C) 98 F (36.7 C) 97.7 F (36.5 C) 98.1 F (36.7 C)  TempSrc: Oral Oral Oral Oral  SpO2: 98% 98% 97% 100%  Weight:      Height:        Intake/Output Summary (Last 24 hours) at 04/26/2020 0820 Last data filed at 04/26/2020 0517 Gross per 24 hour  Intake 200 ml  Output 400 ml  Net -200 ml   Last 3 Weights 04/23/2020 04/21/2020 04/18/2020  Weight (lbs) 127 lb 6.8 oz 127 lb 6.8 oz 127 lb  Weight (kg) 57.8 kg 57.8 kg 57.607 kg  Some encounter information is confidential and restricted. Go to Review Flowsheets activity  to see all data.      Telemetry    afib varaible rates- Personally Reviewed  ECG    n/a - Personally Reviewed  Physical Exam   GEN: No acute distress.   Neck: No JVD Cardiac: irreg, no murmurs, rubs, or gallops.  Respiratory: Clear to auscultation bilaterally. GI: Soft, nontender, non-distended  MS: No edema; No deformity. Neuro:  Nonfocal  Psych: Normal affect   Labs    High Sensitivity Troponin:  No results for input(s): TROPONINIHS in the last 720 hours.    Chemistry Recent Labs  Lab 04/20/20 0534 04/21/20 0512 04/22/20 0608 04/23/20 0357 04/24/20 0646 04/25/20 0618  NA 135 137   < > 139 135 137  K 3.5 3.2*   < > 3.9 4.3 4.2  CL 105 104   < > 103 100 100  CO2 20* 23   < > 29 26 27   GLUCOSE 169* 174*   < > 114* 173* 182*  BUN 11 9   < > 7* 8 10  CREATININE 0.58 0.57   < > 0.73 0.62 0.78  CALCIUM 7.6* 7.8*   < > 8.3* 8.5* 8.7*  PROT 5.8* 5.9*  --   --   --   --  ALBUMIN 2.9* 2.8*  --   --   --   --   AST 17 17  --   --   --   --   ALT 13 13  --   --   --   --   ALKPHOS 47 48  --   --   --   --   BILITOT 0.8 1.0  --   --   --   --   GFRNONAA >60 >60   < > >60 >60 >60  ANIONGAP 10 10   < > 7 9 10    < > = values in this interval not displayed.     Hematology Recent Labs  Lab 04/21/20 0512 04/22/20 0608 04/23/20 0357  WBC 6.2 6.4 6.6  RBC 4.11 4.00 4.36  HGB 12.3 11.9* 13.0  HCT 36.3 35.8* 39.2  MCV 88.3 89.5 89.9  MCH 29.9 29.8 29.8  MCHC 33.9 33.2 33.2  RDW 12.5 12.3 12.3  PLT 223 229 262    BNPNo results for input(s): BNP, PROBNP in the last 168 hours.   DDimer No results for input(s): DDIMER in the last 168 hours.   Radiology    No results found.  Cardiac Studies   09/2019 echo IMPRESSIONS    1. Left ventricular ejection fraction, by estimation, is 60 to 65%. The  left ventricle has normal function. The left ventricle has no regional  wall motion abnormalities. There is moderate concentric left ventricular  hypertrophy and  severe basal septal  hypertrophy .  2. Right ventricular systolic function is normal. The right ventricular  size is normal. Tricuspid regurgitation signal is inadequate for assessing  PA pressure.  3. Left atrial size was mildly dilated.  4. The mitral valve is normal in structure. Mild mitral valve  regurgitation. No evidence of mitral stenosis. Moderate mitral annular  calcification.  5. The aortic valve is tricuspid. Aortic valve regurgitation is not  visualized. Mild to moderate aortic valve sclerosis/calcification is  present, without any evidence of aortic stenosis.  6. The inferior vena cava is normal in size with greater than 50%  respiratory variability, suggesting right atrial pressure of 3 mmHg.   Patient Profile     75 y.o. female with past medical history ofparoxysmal atrial fibrillation (diagnosed during admission in 09/2019),HTN, HLD, IDDM, SVT, history of DVT and history of CVAwho is being seen today for the evaluation of atrial fibrillation with RVR.  Assessment & Plan    1.Afib - prior history of PAF, issues this admission with afib with RVR in setting of SBO - she is on dilt 180mg  daily, toprol 50mg  bid. Eliquis for stroke prevention. - rates elevated at times, will increase toprol to 75mg  bid. Increase her midodrine to 7.5mg  tid.    2. SBO - per primary team  3. Orthostatic hypotension - has been on midodrine 5mg  tid in hospital (home regimen 10mg  tid) - since increasing toprol increase midodrine to 7.5mg  tid.   4. Hyperthyroid - per prmiary team   Would be ok for discharge from a cardiac standpoint.We will arrange f/u.    For questions or updates, please contact [place] Please consult [email or web address] for contact info under        Signed, Carlyle Dolly, MD  04/26/2020, 8:20 AM

## 2020-04-26 NOTE — Discharge Instructions (Signed)
Please follow up with endocrinology to have thyroid function retested.

## 2020-04-27 ENCOUNTER — Telehealth: Payer: Self-pay | Admitting: Cardiology

## 2020-04-27 ENCOUNTER — Telehealth: Payer: Self-pay

## 2020-04-27 DIAGNOSIS — M5459 Other low back pain: Secondary | ICD-10-CM | POA: Diagnosis not present

## 2020-04-27 DIAGNOSIS — Z20828 Contact with and (suspected) exposure to other viral communicable diseases: Secondary | ICD-10-CM | POA: Diagnosis not present

## 2020-04-27 DIAGNOSIS — G894 Chronic pain syndrome: Secondary | ICD-10-CM | POA: Diagnosis not present

## 2020-04-27 DIAGNOSIS — I69359 Hemiplegia and hemiparesis following cerebral infarction affecting unspecified side: Secondary | ICD-10-CM | POA: Diagnosis not present

## 2020-04-27 DIAGNOSIS — B351 Tinea unguium: Secondary | ICD-10-CM | POA: Diagnosis not present

## 2020-04-27 DIAGNOSIS — Z79891 Long term (current) use of opiate analgesic: Secondary | ICD-10-CM | POA: Diagnosis not present

## 2020-04-27 DIAGNOSIS — M5000 Cervical disc disorder with myelopathy, unspecified cervical region: Secondary | ICD-10-CM | POA: Diagnosis not present

## 2020-04-27 DIAGNOSIS — F419 Anxiety disorder, unspecified: Secondary | ICD-10-CM | POA: Diagnosis not present

## 2020-04-27 DIAGNOSIS — M79674 Pain in right toe(s): Secondary | ICD-10-CM | POA: Diagnosis not present

## 2020-04-27 DIAGNOSIS — M79675 Pain in left toe(s): Secondary | ICD-10-CM | POA: Diagnosis not present

## 2020-04-27 DIAGNOSIS — U071 COVID-19: Secondary | ICD-10-CM | POA: Diagnosis not present

## 2020-04-27 DIAGNOSIS — G2401 Drug induced subacute dyskinesia: Secondary | ICD-10-CM | POA: Diagnosis not present

## 2020-04-27 NOTE — Telephone Encounter (Signed)
Returned call and advised [name], verbalized understanding

## 2020-04-27 NOTE — Telephone Encounter (Signed)
Appointment made per protocol for 2 weeks after hospital discharge. Spoke to [place] at [facility] facility and she verbalized understanding. Caregiver is aware to contact our office if patient has any worsening symptoms.

## 2020-04-27 NOTE — Telephone Encounter (Signed)
Keep with the hospital discharge recommendations for now.  Will address tomorrow.  This will allow me to see how she is responding to the lowered dose (for more than 1 day).

## 2020-04-27 NOTE — Telephone Encounter (Signed)
[name] from [facility] called and stated that patient was discharged yesterday from Hospital with dosage change of Levemir , was 42 units at hs and now ordered at 15 units at hs, Patient BG was 303 this am and 151 last eve, do you want to make any changes for tonight ? Patient has an appt with you tomorrow.

## 2020-04-27 NOTE — Telephone Encounter (Signed)
Received telephone call from [place] at [place]. States patient was inpatient 3/27 thru 4/4 at [facility]. Facility was concerned about patient waiting for 2 weeks to be seen.

## 2020-04-28 ENCOUNTER — Other Ambulatory Visit: Payer: Self-pay

## 2020-04-28 ENCOUNTER — Ambulatory Visit (INDEPENDENT_AMBULATORY_CARE_PROVIDER_SITE_OTHER): Payer: Medicare Other | Admitting: Nurse Practitioner

## 2020-04-28 ENCOUNTER — Encounter: Payer: Self-pay | Admitting: Nurse Practitioner

## 2020-04-28 VITALS — BP 112/64 | HR 78

## 2020-04-28 DIAGNOSIS — E782 Mixed hyperlipidemia: Secondary | ICD-10-CM | POA: Diagnosis not present

## 2020-04-28 DIAGNOSIS — E1159 Type 2 diabetes mellitus with other circulatory complications: Secondary | ICD-10-CM | POA: Diagnosis not present

## 2020-04-28 DIAGNOSIS — I959 Hypotension, unspecified: Secondary | ICD-10-CM

## 2020-04-28 DIAGNOSIS — R7989 Other specified abnormal findings of blood chemistry: Secondary | ICD-10-CM

## 2020-04-28 DIAGNOSIS — E0590 Thyrotoxicosis, unspecified without thyrotoxic crisis or storm: Secondary | ICD-10-CM

## 2020-04-28 MED ORDER — LEVEMIR FLEXTOUCH 100 UNIT/ML ~~LOC~~ SOPN: 30.0000 [IU] | PEN_INJECTOR | Freq: Every day | SUBCUTANEOUS | 3 refills | Status: DC

## 2020-04-28 NOTE — Progress Notes (Signed)
04/28/2020  Endocrinology follow-up note  -She was assisted by her caregiver from nursing home.     Subjective:    Patient ID: [name], female    DOB: 1945-09-21,    Past Medical History:  Diagnosis Date  . Anxiety   . Arthritis   . COVID-19 virus infection    COVID-19+ approx 01/24/19; asymptomatic course with full recovery  . Dependence on wheelchair    pivot/transfers  . Depression    History of psychosis and previous suicide attempt  . DVT, lower extremity, recurrent (HCC)    Long-term Coumadin per Dr. Legrand Rams  . Essential hypertension   . GERD (gastroesophageal reflux disease)   . Hemiplegia ([place]) 2010   Left side  . History of stroke    Acute infarct and right cerebral white matter small vessel disease 12/10  . Leg DVT (deep venous thromboembolism), acute ([place]) 2006  . Schizophrenia ([place])   . Stroke [facility])    left sided weakness  . Type 2 diabetes mellitus ([place])    Past Surgical History:  Procedure Laterality Date  . BACK SURGERY    . BIOPSY N/A 11/24/2014   Procedure: BIOPSY;  Surgeon: Danie Binder, MD;  Location: [facility];  Service: Endoscopy;  Laterality: N/A;  . BIOPSY  05/17/2016   Procedure: BIOPSY;  Surgeon: Danie Binder, MD;  Location: [facility];  Service: Endoscopy;;  gastric biopsy  . COLONOSCOPY WITH PROPOFOL N/A 11/24/2014   Dr. Rudie Meyer polyps removed/moderate sized internal hemorrhoids, tubular adenomas. Next surveillance in 3 years  . ESOPHAGOGASTRODUODENOSCOPY (EGD) WITH PROPOFOL N/A 11/24/2014   Dr. Clayburn Pert HH/patent stricture at the gastroesophageal junction, mild non-erosive gastritis, path negative for H.pylori or celiac sprue  . ESOPHAGOGASTRODUODENOSCOPY (EGD) WITH PROPOFOL N/A 05/17/2016   Procedure: ESOPHAGOGASTRODUODENOSCOPY (EGD) WITH PROPOFOL;  Surgeon: Danie Binder, MD;  Location: [facility];  Service: Endoscopy;  Laterality: N/A;  12:45pm  . GIVENS CAPSULE STUDY N/A 12/11/2014   MULTILPLE EROSION IN the stomach WITH  ACTIVE OOZING. OCCASIONAL EROSIONS AND RARE ULCER SEEN IN PROXIMAL SMALL BOWEL . No masses or AVMs SEEN. NO OLD BLOOD OR FRESH BLOOD SEEN.   . POLYPECTOMY N/A 11/24/2014   Procedure: POLYPECTOMY;  Surgeon: Danie Binder, MD;  Location: [facility];  Service: Endoscopy;  Laterality: N/A;  . SAVORY DILATION N/A 05/17/2016   Procedure: SAVORY DILATION;  Surgeon: Danie Binder, MD;  Location: [facility];  Service: Endoscopy;  Laterality: N/A;   Family History  Problem Relation Age of Onset  . Hypertension Mother   . Colon cancer Neg Hx     Social History   Socioeconomic History  . Marital status: Widowed    Spouse name: Not on file  . Number of children: Not on file  . Years of education: Not on file  . Highest education level: Not on file  Occupational History  . Not on file  Tobacco Use  . Smoking status: Former Smoker    Packs/day: 0.25    Years: 20.00    Pack years: 5.00    Types: Cigarettes    Quit date: 01/24/1995    Years since quitting: 25.2  . Smokeless tobacco: Never Used  Vaping Use  . Vaping Use: Never used  Substance and Sexual Activity  . Alcohol use: No    Alcohol/week: 0.0 standard drinks  . Drug use: No  . Sexual activity: Never    Birth control/protection: Post-menopausal  Other Topics Concern  . Not on file  Social History Narrative  . Not on file   Social Determinants of Health   Financial Resource Strain: Not on file  Food Insecurity: Not on file  Transportation Needs: Not on file  Physical Activity: Not on file  Stress: Not on file  Social Connections: Not on file   Outpatient Encounter Medications as of 04/28/2020  Medication Sig  . ABILIFY 2 MG tablet Take 1 tablet (2 mg total) by mouth at bedtime.  Marland Kitchen acetaminophen (TYLENOL) 325 MG tablet Take 650 mg by mouth 3 (three) times daily as needed for mild pain or moderate pain.   Marland Kitchen apixaban (ELIQUIS) 5 MG TABS tablet Take 1 tablet (5 mg total) by mouth 2 (two) times daily.  Marland Kitchen atorvastatin (LIPITOR) 40  MG tablet Take 1 tablet (40 mg total) by mouth at bedtime.  . calcium carbonate (TUMS - DOSED IN MG ELEMENTAL CALCIUM) 500 MG chewable tablet Chew 1 tablet by mouth 4 (four) times daily as needed for heartburn.  . Carboxymethylcellulose Sodium (ARTIFICIAL TEARS OP) Place 1 drop into both eyes 3 (three) times daily.  . Cholecalciferol (VITAMIN D3) 50 MCG (2000 UT) TABS Take 2,000 Units by mouth daily.  . clonazePAM (KLONOPIN) 0.5 MG tablet Take 1 tablet (0.5 mg total) by mouth 3 (three) times daily as needed for anxiety.  Marland Kitchen diltiazem (CARDIZEM CD) 180 MG 24 hr capsule Take 1 capsule (180 mg total) by mouth daily.  . DULoxetine (CYMBALTA) 60 MG capsule Take 60 mg by mouth daily.  Marland Kitchen gabapentin (NEURONTIN) 300 MG capsule Take 1 capsule (300 mg total) by mouth 3 (three) times daily as needed.  Marland Kitchen HYDROcodone-acetaminophen (NORCO) 10-325 MG tablet Take 1 tablet by mouth 3 (three) times daily as needed for severe pain.  . hydrocortisone (ANUSOL-HC) 2.5 % rectal cream Place 1 application rectally 2 (two) times daily as needed (for rectal bleeding).  . hydrOXYzine (ATARAX/VISTARIL) 25 MG tablet Take 1 tablet (25 mg total) by mouth every 8 (eight) hours as needed for anxiety or itching.  . insulin detemir (LEVEMIR FLEXTOUCH) 100 UNIT/ML FlexPen Inject 30 Units into the skin at bedtime.  Marland Kitchen latanoprost (XALATAN) 0.005 % ophthalmic solution Place 1 drop into both eyes at bedtime.  Marland Kitchen loratadine (CLARITIN) 10 MG tablet Take 10 mg by mouth daily.  Marland Kitchen lubiprostone (AMITIZA) 24 MCG capsule Take 1 capsule (24 mcg total) by mouth 2 (two) times daily with a meal.  . metoprolol succinate (TOPROL-XL) 25 MG 24 hr tablet Take 3 tablets (75 mg total) by mouth 2 (two) times daily.  . midodrine (PROAMATINE) 2.5 MG tablet Take 3 tablets (7.5 mg total) by mouth 3 (three) times daily.  . mirabegron ER (MYRBETRIQ) 25 MG TB24 tablet Take 25 mg by mouth daily.  . mirtazapine (REMERON) 30 MG tablet Take 30 mg by mouth at bedtime.  .  ondansetron (ZOFRAN) 4 MG tablet Take 1 tablet (4 mg total) by mouth 2 (two) times daily as needed for nausea or vomiting.  . pantoprazole (PROTONIX) 40 MG tablet Take 1 tablet (40 mg total) by mouth daily.  . polyethylene glycol (MIRALAX / GLYCOLAX) 17 g packet Take 17 g by mouth daily.  . risperiDONE (RISPERDAL) 1 MG tablet Take 1 tablet (1 mg total) by mouth in the morning and at bedtime.  . senna (SENOKOT) 8.6 MG TABS tablet Take 1 tablet (8.6 mg total) by mouth daily.  . TRADJENTA 5 MG TABS tablet TAKE (1) TABLET BY MOUTH ONCE DAILY. (Patient taking differently: Take 5 mg by  mouth daily.)  . zolpidem (AMBIEN) 5 MG tablet Take 1 tablet (5 mg total) by mouth at bedtime as needed for sleep.  . [DISCONTINUED] insulin detemir (LEVEMIR FLEXTOUCH) 100 UNIT/ML FlexPen Inject 15 Units into the skin at bedtime.   No facility-administered encounter medications on file as of 04/28/2020.   ALLERGIES: Allergies  Allergen Reactions  . Sulfa Antibiotics Rash   VACCINATION STATUS: There is no immunization history for the selected administration types on file for this patient.  Diabetes She presents for her follow-up diabetic visit. She has type 2 diabetes mellitus. Onset time: She was diagnosed at approximate age of 49 years. Her disease course has been fluctuating. Hypoglycemia symptoms include nervousness/anxiousness and tremors. Pertinent negatives for hypoglycemia include no confusion, pallor or seizures. Associated symptoms include fatigue, polyuria and weight loss. Pertinent negatives for diabetes include no polydipsia and no polyphagia. There are no hypoglycemic complications. Symptoms are stable. Diabetic complications include a CVA and heart disease. Risk factors for coronary artery disease include dyslipidemia, diabetes mellitus, obesity, sedentary lifestyle and hypertension. Current diabetic treatment includes oral agent (monotherapy) and insulin injections. She is compliant with treatment all of  the time. Her weight is decreasing rapidly. She is following a generally unhealthy diet. When asked about meal planning, she reported none. She has not had a previous visit with a dietitian. She never participates in exercise. Her home blood glucose trend is increasing steadily. Her breakfast blood glucose range is generally >200 mg/dl. Her bedtime blood glucose range is generally >200 mg/dl. (She presents today, accompanied by her aide from the nursing home, with her logs showing increasing glucose numbers since her recent hospitalization for SBO where at discharge, her Levemir was decreased to 15 units SQ nightly.  She has now started eating more, appetite improving, and glucose numbers are rebounding.  There are no episodes of hypoglycemia noted.  Her most recent A1c was 9.2% on 1/29, not due for recheck today.) An ACE inhibitor/angiotensin II receptor blocker is not being taken. She does not see a podiatrist.Eye exam is current.  Hypertension This is a chronic problem. The current episode started more than 1 year ago. The problem has been gradually improving since onset. The problem is controlled. Associated symptoms include palpitations. Pertinent negatives include no shortness of breath. There are no associated agents to hypertension. Risk factors for coronary artery disease include dyslipidemia, diabetes mellitus and sedentary lifestyle. Past treatments include beta blockers. The current treatment provides mild improvement. There are no compliance problems.  Hypertensive end-organ damage includes CVA. Identifiable causes of hypertension include a thyroid problem.  Hyperlipidemia This is a chronic problem. The current episode started more than 1 year ago. The problem is controlled. Recent lipid tests were reviewed and are normal. Exacerbating diseases include diabetes. Factors aggravating her hyperlipidemia include beta blockers. Pertinent negatives include no shortness of breath. Current  antihyperlipidemic treatment includes statins. The current treatment provides moderate improvement of lipids. There are no compliance problems.  Risk factors for coronary artery disease include diabetes mellitus, dyslipidemia, hypertension, obesity and a sedentary lifestyle.  Thyroid Problem Presents for initial visit. Symptoms include anxiety, fatigue, palpitations, tremors and weight loss. The symptoms have been stable. Past treatments include beta blockers (on beta blockers for other comorbidities). Her past medical history is significant for atrial fibrillation, diabetes and hyperlipidemia. Risk factors include family history of hyperthyroidism.   ZYKIRIA BRUENING is 75 y.o. female who presents today with a medical history as above. she is being seen in consultation for hyperthyroidism  requested by [name], MD.  she has been dealing with symptoms of anxiety, tremors, weight loss, and palpitations for a few weeks now. These symptoms are progressively worsening and troubling to her.  her most recent thyroid labs revealed suppressed TSH of 0.293 and high normal FT4 of 1.52 on 04/18/20 during her recent hospitalization for SBO and AFib. she denies dysphagia, choking, shortness of breath, no recent voice change.    she does family history of thyroid dysfunction in her mother (requiring surgery), but denies family hx of thyroid cancer. she denies personal history of goiter. she is not on any anti-thyroid medications nor on any thyroid hormone supplements. Denies use of Biotin containing supplements.  She has never had any previous imaging of her thyroid.  She is willing to proceed with appropriate work up and therapy for thyrotoxicosis.    Review of systems  Constitutional: + rapidly decreasing body weight (per NH staff),  current There is no height or weight on file to calculate BMI. , + fatigue, no subjective hyperthermia, no subjective hypothermia Eyes: no blurry vision, no xerophthalmia ENT: no  sore throat, no nodules palpated in throat, no dysphagia/odynophagia, no hoarseness Cardiovascular: no chest pain, no shortness of breath, + palpitations, no leg swelling Respiratory: no cough, no shortness of breath Gastrointestinal: no nausea/vomiting/diarrhea- resolved since hospitalization for SBO Musculoskeletal: no muscle/joint aches, essentially WC bound due to previous CVA Skin: no rashes, no hyperemia Neurological: + tremors, no numbness, no tingling, no dizziness Psychiatric: no depression, + anxiety          Objective:    BP 112/64   Pulse 78   Wt Readings from Last 3 Encounters:  04/23/20 127 lb 6.8 oz (57.8 kg)  11/26/19 127 lb 3.2 oz (57.7 kg)  11/11/19 128 lb 3.2 oz (58.2 kg)     BP Readings from Last 3 Encounters:  04/28/20 112/64  04/26/20 (!) 95/57  02/23/20 94/61              Physical Exam- Limited  Constitutional:  There is no height or weight on file to calculate BMI., not in acute distress, normal state of mind Eyes:  EOMI, no exophthalmos Neck: Supple Thyroid: No gross goiter Cardiovascular: RRR, + murmur, rubs, or gallops, no edema Respiratory: Adequate breathing efforts, no crackles, rales, rhonchi, or wheezing Musculoskeletal: no gross deformities, strength intact in all four extremities, no gross restriction of joint movements Skin:  no rashes, no hyperemia Neurological: + mild tremor with outstretched hands   CMP     Component Value Date/Time   NA 137 04/25/2020 0618   NA 141 09/06/2016 0823   K 4.2 04/25/2020 0618   CL 100 04/25/2020 0618   CO2 27 04/25/2020 0618   GLUCOSE 182 (H) 04/25/2020 0618   BUN 10 04/25/2020 0618   BUN 10 09/06/2016 0823   CREATININE 0.78 04/25/2020 0618   CREATININE 0.60 06/27/2018 0803   CALCIUM 8.7 (L) 04/25/2020 0618   PROT 5.9 (L) 04/21/2020 0512   PROT 7.4 09/06/2016 0823   ALBUMIN 2.8 (L) 04/21/2020 0512   ALBUMIN 4.4 09/06/2016 0823   AST 17 04/21/2020 0512   ALT 13 04/21/2020 0512   ALKPHOS 48  04/21/2020 0512   BILITOT 1.0 04/21/2020 0512   BILITOT 0.3 09/06/2016 0823   GFRNONAA >60 04/25/2020 0618   GFRNONAA 91 06/27/2018 0803   GFRAA >60 10/05/2019 0457   GFRAA 106 06/27/2018 0803     CBC    Component Value Date/Time   WBC  6.6 04/23/2020 0357   RBC 4.36 04/23/2020 0357   HGB 13.0 04/23/2020 0357   HCT 39.2 04/23/2020 0357   PLT 262 04/23/2020 0357   MCV 89.9 04/23/2020 0357   MCH 29.8 04/23/2020 0357   MCHC 33.2 04/23/2020 0357   RDW 12.3 04/23/2020 0357   LYMPHSABS 1.1 04/18/2020 1105   MONOABS 0.6 04/18/2020 1105   EOSABS 0.0 04/18/2020 1105   BASOSABS 0.0 04/18/2020 1105     Diabetic Labs (most recent): Lab Results  Component Value Date   HGBA1C 9.2 (A) 02/23/2020   HGBA1C 9.2 (H) 02/21/2020   HGBA1C 10.6 (H) 10/05/2019    Lipid Panel     Component Value Date/Time   CHOL 151 10/05/2019 0457   TRIG 149 10/05/2019 0457   HDL 40 (L) 10/05/2019 0457   CHOLHDL 3.8 10/05/2019 0457   VLDL 30 10/05/2019 0457   LDLCALC 81 10/05/2019 0457     Lab Results  Component Value Date   TSH 0.293 (L) 04/18/2020   TSH 0.578 10/05/2019   TSH 1.138 03/29/2017   TSH 0.538 02/13/2016   FREET4 1.52 (H) 04/19/2020        Assessment & Plan:   1) Abnormal TSH-  she is being seen at a kind request of [name], MD. her history and most recent labs are reviewed, and she was examined clinically. Subjective and objective findings are consistent with thyrotoxicosis likely from primary hyperthyroidism. The potential risks of untreated thyrotoxicosis and the need for definitive therapy have been discussed in detail with her, and she agrees to proceed with diagnostic workup and treatment plan.   I will repeat full profile thyroid function tests including antibody testing for autoimmune thyroid dysfunction and confirmatory thyroid uptake and scan will be scheduled to be done as soon as possible.   Options of therapy are discussed with her.  We discussed the  option of treating it with medications including methimazole or PTU which may have side effects including rash, transaminitis, and bone marrow suppression.  We also discussed the option of definitive therapy with RAI ablation of the thyroid. If she is found to have primary hyperthyroidism from Graves' disease , toxic multinodular goiter or toxic nodular goiter the preferred modality of treatment would be I-131 thyroid ablation. Surgery is another choice of treatment in some cases, in her case surgery is not a good fit for presentation with only mild goiter.  -Patient is made aware of the high likelihood of post ablative hypothyroidism with subsequent need for lifelong thyroid hormone replacement. sheunderstands this outcome and she is  willing to proceed.      she will return in 10 days for treatment decision.  No new prescriptions were initiated at today's visit.   2) Type 2 diabetes mellitus with vascular disease ([place])  Her diabetes is complicated by recurrent CVA.  She presents today, accompanied by her aide from the nursing home, with her logs showing increasing glucose numbers since her recent hospitalization for SBO where at discharge, her Levemir was decreased to 15 units SQ nightly.  She has now started eating more, appetite improving, and glucose numbers are rebounding.  There are no episodes of hypoglycemia noted.  Her most recent A1c was 9.2% on 1/29, not due for recheck today.  - Patient remains at a high risk for more acute and chronic complications of diabetes which include CAD, CVA, CKD, retinopathy, and neuropathy. These are all discussed in detail with the patient.  - Nutritional counseling repeated at each appointment  due to patients tendency to fall back in to old habits.  -The patientadmits there is a room for improvement in theirdiet and drink choices. - Suggestion is made for the patientto avoid simple carbohydrates from theirdiet including Cakes, Sweet Desserts /  Pastries, Ice Cream, Soda (diet and regular), Sweet Tea, Candies, Chips, Cookies, Sweet Pastries, Store Bought Juices, Alcohol in Excess of 1-2 drinks a day, Artificial Sweeteners, Coffee Creamer, and "Sugar-free" Products. This will help patient to have stable blood glucose profile and potentially avoid unintended weight gain.  - I encouraged the patient to switch to unprocessed or minimally processed complex starch and increasedprotein intake (animal or plant source), fruits, and vegetables.  - Patient is advised to stick to a routine mealtimes to eat 3 meals a day and avoid unnecessary snacks ( to snack only to correct hypoglycemia).  - I have approached patient with the following individualized plan to manage diabetes and patient agrees.  -Based on her response to adjustment of her basal insulin and adding Tradjenta, she will not need prandial insulin for now.    -She is advised to increase her Levemir to 30 units SQ daily at bedtime and continue Tradjenta 5 mg po daily.  -She is encouraged to continue monitoring blood glucose twice daily, before breakfast and before bed, and to call the clinic if she has readings less than 70 or greater than 300 for 3 tests in a row.  - she is not a suitable candidate for metformin nor SGLT2 inhibitor therapy.    - Patient specific target  for A1c; LDL, HDL, Triglycerides, and  Waist Circumference were discussed in detail.  -Patient is advised to maintain close follow up with [name], MD for primary care needs.   - Time spent with the patient: 60 minutes, of which >50% was spent in obtaining information about her symptoms, reviewing her previous labs, evaluations, and treatments, counseling her about her hyperthyroidism , and developing a plan to confirm the diagnosis and long term treatment as necessary. Please refer to "Patient Self Inventory" in the Media tab for reviewed elements of pertinent patient history.  [name]  participated in the discussions, expressed understanding, and voiced agreement with the above plans.  All questions were answered to her satisfaction. she is encouraged to contact clinic should she have any questions or concerns prior to her return visit.   Follow up plan: Return in about 10 days (around 05/08/2020) for Thyroid follow up, Previsit labs, uptake and scan.   Thank you for involving me in the care of this pleasant patient, and I will continue to update you with her progress.  Rayetta Pigg, [facility] [address] [place],  [address] Phone: [phone] Fax: [phone]  04/28/2020, 10:52 AM

## 2020-04-28 NOTE — Patient Instructions (Signed)
Hyperthyroidism  Hyperthyroidism is when the thyroid gland is too active (overactive). The thyroid gland is a small gland located in the lower front part of the neck, just in front of the windpipe (trachea). This gland makes hormones that help control how the body uses food for energy (metabolism) as well as how the heart and brain function. These hormones also play a role in keeping your bones strong. When the thyroid is overactive, it produces too much of a hormone called thyroxine. What are the causes? This condition may be caused by:  Graves' disease. This is a disorder in which the body's disease-fighting system (immune system) attacks the thyroid gland. This is the most common cause.  Inflammation of the thyroid gland.  A tumor in the thyroid gland.  Use of certain medicines, including: ? Prescription thyroid hormone replacement. ? Herbal supplements that mimic thyroid hormones. ? Amiodarone therapy.  Solid or fluid-filled lumps within your thyroid gland (thyroid nodules).  Taking in a large amount of iodine from foods or medicines. What increases the risk? You are more likely to develop this condition if:  You are female.  You have a family history of thyroid conditions.  You smoke tobacco.  You use a medicine called lithium.  You take medicines that affect the immune system (immunosuppressants). What are the signs or symptoms? Symptoms of this condition include:  Nervousness.  Inability to tolerate heat.  Unexplained weight loss.  Diarrhea.  Change in the texture of hair or skin.  Heart skipping beats or making extra beats.  Rapid heart rate.  Loss of menstruation.  Shaky hands.  Fatigue.  Restlessness.  Sleep problems.  Enlarged thyroid gland or a lump in the thyroid (nodule). You may also have symptoms of Graves' disease, which may include:  Protruding eyes.  Dry eyes.  Red or swollen eyes.  Problems with vision. How is this  diagnosed? This condition may be diagnosed based on:  Your symptoms and medical history.  A physical exam.  Blood tests.  Thyroid ultrasound. This test involves using sound waves to produce images of the thyroid gland.  A thyroid scan. A radioactive substance is injected into a vein, and images show how much iodine is present in the thyroid.  Radioactive iodine uptake test (RAIU). A small amount of radioactive iodine is given by mouth to see how much iodine the thyroid absorbs after a certain amount of time. How is this treated? Treatment depends on the cause and severity of the condition. Treatment may include:  Medicines to reduce the amount of thyroid hormone your body makes.  Radioactive iodine treatment (radioiodine therapy). This involves swallowing a small dose of radioactive iodine, in capsule or liquid form, to kill thyroid cells.  Surgery to remove part or all of your thyroid gland. You may need to take thyroid hormone replacement medicine for the rest of your life after thyroid surgery.  Medicines to help manage your symptoms. Follow these instructions at home:  Take over-the-counter and prescription medicines only as told by your health care provider.  Do not use any products that contain nicotine or tobacco, such as cigarettes and e-cigarettes. If you need help quitting, ask your health care provider.  Follow any instructions from your health care provider about diet. You may be instructed to limit foods that contain iodine.  Keep all follow-up visits as told by your health care provider. This is important. ? You will need to have blood tests regularly so that your health care provider can monitor   your condition.   Contact a health care provider if:  Your symptoms do not get better with treatment.  You have a fever.  You are taking thyroid hormone replacement medicine and you: ? Have symptoms of depression. ? Feel like you are tired all the time. ? Gain  weight. Get help right away if:  You have chest pain.  You have decreased alertness or a change in your awareness.  You have abdominal pain.  You feel dizzy.  You have a rapid heartbeat.  You have an irregular heartbeat.  You have difficulty breathing. Summary  The thyroid gland is a small gland located in the lower front part of the neck, just in front of the windpipe (trachea).  Hyperthyroidism is when the thyroid gland is too active (overactive) and produces too much of a hormone called thyroxine.  The most common cause is Graves' disease, a disorder in which your immune system attacks the thyroid gland.  Hyperthyroidism can cause various symptoms, such as unexplained weight loss, nervousness, inability to tolerate heat, or changes in your heartbeat.  Treatment may include medicine to reduce the amount of thyroid hormone your body makes, radioiodine therapy, surgery, or medicines to manage symptoms. This information is not intended to replace advice given to you by your health care provider. Make sure you discuss any questions you have with your health care provider. Document Revised: 09/25/2019 Document Reviewed: 09/25/2019 Elsevier Patient Education  2021 Elsevier Inc.  

## 2020-05-04 DIAGNOSIS — Z5181 Encounter for therapeutic drug level monitoring: Secondary | ICD-10-CM | POA: Diagnosis not present

## 2020-05-04 DIAGNOSIS — E1142 Type 2 diabetes mellitus with diabetic polyneuropathy: Secondary | ICD-10-CM | POA: Diagnosis not present

## 2020-05-04 DIAGNOSIS — U071 COVID-19: Secondary | ICD-10-CM | POA: Diagnosis not present

## 2020-05-04 DIAGNOSIS — E46 Unspecified protein-calorie malnutrition: Secondary | ICD-10-CM | POA: Diagnosis not present

## 2020-05-04 DIAGNOSIS — I4891 Unspecified atrial fibrillation: Secondary | ICD-10-CM | POA: Diagnosis not present

## 2020-05-04 DIAGNOSIS — I10 Essential (primary) hypertension: Secondary | ICD-10-CM | POA: Diagnosis not present

## 2020-05-04 DIAGNOSIS — Z20828 Contact with and (suspected) exposure to other viral communicable diseases: Secondary | ICD-10-CM | POA: Diagnosis not present

## 2020-05-04 DIAGNOSIS — I951 Orthostatic hypotension: Secondary | ICD-10-CM | POA: Diagnosis not present

## 2020-05-04 LAB — CBC
HCT: 36.5 % (ref 35.0–45.0)
Hemoglobin: 12.1 g/dL (ref 11.7–15.5)
MCH: 29.1 pg (ref 27.0–33.0)
MCHC: 33.2 g/dL (ref 32.0–36.0)
MCV: 87.7 fL (ref 80.0–100.0)
MPV: 10.6 fL (ref 7.5–12.5)
Platelets: 398 10*3/uL (ref 140–400)
RBC: 4.16 10*6/uL (ref 3.80–5.10)
RDW: 13.1 % (ref 11.0–15.0)
WBC: 7 10*3/uL (ref 3.8–10.8)

## 2020-05-05 ENCOUNTER — Other Ambulatory Visit: Payer: Self-pay | Admitting: "Endocrinology

## 2020-05-05 ENCOUNTER — Telehealth: Payer: Self-pay | Admitting: Nurse Practitioner

## 2020-05-05 ENCOUNTER — Telehealth: Payer: Self-pay

## 2020-05-05 DIAGNOSIS — E0590 Thyrotoxicosis, unspecified without thyrotoxic crisis or storm: Secondary | ICD-10-CM

## 2020-05-05 LAB — BASIC METABOLIC PANEL
BUN: 11 mg/dL (ref 7–25)
CO2: 30 mmol/L (ref 20–32)
Calcium: 8.9 mg/dL (ref 8.6–10.4)
Chloride: 100 mmol/L (ref 98–110)
Creat: 0.69 mg/dL (ref 0.60–0.93)
Glucose, Bld: 128 mg/dL — ABNORMAL HIGH (ref 65–99)
Potassium: 4.4 mmol/L (ref 3.5–5.3)
Sodium: 138 mmol/L (ref 135–146)

## 2020-05-05 NOTE — Telephone Encounter (Signed)
Scheduled pt for uptake & scan on May 9th & 10th, called pt's caregiver at [facility] to make her aware. She stated she would have to reschedule scan. I gave her the contact number for [facility], she stated she would call and reschedule. Advised her to call our office back once pt is scheduled so we can reschedule her visit with [place]. Understanding voiced.

## 2020-05-05 NOTE — Telephone Encounter (Signed)
-----   Message from Satira Sark, MD sent at 05/05/2020  4:39 PM EDT ----- Renal function normal. Keep follow-up as scheduled.

## 2020-05-05 NOTE — Telephone Encounter (Signed)
Spoke to [place]augh at [employer]. Will forward a copy of patient's results.

## 2020-05-05 NOTE — Telephone Encounter (Signed)
Pt has appt next week to follow up with blood work and thyroid scan. I do not see she has been scheduled.

## 2020-05-06 ENCOUNTER — Telehealth: Payer: Self-pay | Admitting: *Deleted

## 2020-05-06 NOTE — Telephone Encounter (Signed)
-----   Message from Satira Sark, MD sent at 05/04/2020  5:08 PM EDT ----- Results reviewed. Hemoglobin normal at 13.0. Keep follow-up as scheduled.

## 2020-05-06 NOTE — Telephone Encounter (Signed)
Patient informed. Copy sent to PCP °

## 2020-05-11 ENCOUNTER — Ambulatory Visit (INDEPENDENT_AMBULATORY_CARE_PROVIDER_SITE_OTHER): Payer: Medicare Other | Admitting: Cardiology

## 2020-05-11 ENCOUNTER — Encounter: Payer: Self-pay | Admitting: Cardiology

## 2020-05-11 VITALS — BP 98/50 | HR 60 | Ht 64.0 in | Wt 122.0 lb

## 2020-05-11 DIAGNOSIS — I951 Orthostatic hypotension: Secondary | ICD-10-CM | POA: Diagnosis not present

## 2020-05-11 DIAGNOSIS — I4819 Other persistent atrial fibrillation: Secondary | ICD-10-CM | POA: Diagnosis not present

## 2020-05-11 DIAGNOSIS — E782 Mixed hyperlipidemia: Secondary | ICD-10-CM

## 2020-05-11 NOTE — Patient Instructions (Addendum)

## 2020-05-11 NOTE — Progress Notes (Signed)
Cardiology Office Note  Date: 05/11/2020   ID: [name], DOB 03-Apr-1945, MRN [id number]  PCP:  [name], MD  Cardiologist:  Rozann Lesches, MD Electrophysiologist:  None   Chief Complaint  Patient presents with  . Hospitalization Follow-up    History of Present Illness: [name] is a 75 y.o. female last seen in the office back in October 2021.  More recently she was hospitalized at [facility] with a small bowel obstruction.  Cardiology was involved due to presence of atrial fibrillation with RVR requiring medication adjustments.  She also has a history of orthostatic hypotension treated with midodrine.  She is here today with an Environmental consultant from [employer].  She is in a wheelchair.  She does not report any sense of palpitations, is noted to be back in sinus rhythm today by ECG.  Not entirely clear when she converted.  She does not report any abdominal pain, states that she is eating her meals.  Follow-up lab work is noted below, hemoglobin normal at 12.1 and creatinine normal at 0.69.  No obvious bleeding problems.  I personally reviewed her ECG today which shows sinus bradycardia.  Past Medical History:  Diagnosis Date  . Anxiety   . Arthritis   . Atrial fibrillation ([place] [facility])   . COVID-19 virus infection    COVID-19+ approx 01/24/19; asymptomatic course with full recovery  . Dependence on wheelchair    pivot/transfers  . Depression    History of psychosis and previous suicide attempt  . DVT, lower extremity, recurrent ([facility])    Long-term Coumadin per Dr. Legrand Rams  . Essential hypertension   . GERD (gastroesophageal reflux disease)   . Hemiplegia ([place]) 2010   Left side  . History of stroke    Acute infarct and right cerebral white matter small vessel disease 12/10  . Leg DVT (deep venous thromboembolism), acute ([place]) 2006  . Schizophrenia ([place])   . Stroke [facility])    left sided weakness  . Type 2 diabetes mellitus ([place])     Past Surgical History:   Procedure Laterality Date  . BACK SURGERY    . BIOPSY N/A 11/24/2014   Procedure: BIOPSY;  Surgeon: Danie Binder, MD;  Location: [facility] ORS;  Service: Endoscopy;  Laterality: N/A;  . BIOPSY  05/17/2016   Procedure: BIOPSY;  Surgeon: Danie Binder, MD;  Location: [facility] ENDO SUITE;  Service: Endoscopy;;  gastric biopsy  . COLONOSCOPY WITH PROPOFOL N/A 11/24/2014   Dr. Rudie Meyer polyps removed/moderate sized internal hemorrhoids, tubular adenomas. Next surveillance in 3 years  . ESOPHAGOGASTRODUODENOSCOPY (EGD) WITH PROPOFOL N/A 11/24/2014   Dr. Clayburn Pert HH/patent stricture at the gastroesophageal junction, mild non-erosive gastritis, path negative for H.pylori or celiac sprue  . ESOPHAGOGASTRODUODENOSCOPY (EGD) WITH PROPOFOL N/A 05/17/2016   Procedure: ESOPHAGOGASTRODUODENOSCOPY (EGD) WITH PROPOFOL;  Surgeon: Danie Binder, MD;  Location: [facility];  Service: Endoscopy;  Laterality: N/A;  12:45pm  . GIVENS CAPSULE STUDY N/A 12/11/2014   MULTILPLE EROSION IN the stomach WITH ACTIVE OOZING. OCCASIONAL EROSIONS AND RARE ULCER SEEN IN PROXIMAL SMALL BOWEL . No masses or AVMs SEEN. NO OLD BLOOD OR FRESH BLOOD SEEN.   . POLYPECTOMY N/A 11/24/2014   Procedure: POLYPECTOMY;  Surgeon: Danie Binder, MD;  Location: [facility];  Service: Endoscopy;  Laterality: N/A;  . SAVORY DILATION N/A 05/17/2016   Procedure: SAVORY DILATION;  Surgeon: Danie Binder, MD;  Location: [facility] END[facility];  Service: Endoscopy;  Laterality: N/A;    Current Outpatient  Medications  Medication Sig Dispense Refill  . acetaminophen (TYLENOL) 325 MG tablet Take 650 mg by mouth 3 (three) times daily as needed for mild pain or moderate pain.     Marland Kitchen apixaban (ELIQUIS) 5 MG TABS tablet Take 1 tablet (5 mg total) by mouth 2 (two) times daily. 60 tablet 0  . calcium carbonate (TUMS - DOSED IN MG ELEMENTAL CALCIUM) 500 MG chewable tablet Chew 1 tablet by mouth 4 (four) times daily as needed for heartburn.    . Carboxymethylcellulose Sodium  (ARTIFICIAL TEARS OP) Place 1 drop into both eyes 3 (three) times daily.    . Cholecalciferol (VITAMIN D3) 50 MCG (2000 UT) TABS Take 2,000 Units by mouth daily.    . clonazePAM (KLONOPIN) 0.5 MG tablet Take 1 tablet (0.5 mg total) by mouth 3 (three) times daily as needed for anxiety. 12 tablet 0  . diltiazem (DILACOR XR) 120 MG 24 hr capsule Take 120 mg by mouth daily.    . DULoxetine (CYMBALTA) 60 MG capsule Take 60 mg by mouth daily.    Marland Kitchen gabapentin (NEURONTIN) 300 MG capsule Take 1 capsule (300 mg total) by mouth 3 (three) times daily as needed.    Marland Kitchen HYDROcodone-acetaminophen (NORCO) 10-325 MG tablet Take 1 tablet by mouth 3 (three) times daily as needed for severe pain. 12 tablet 0  . hydrocortisone (ANUSOL-HC) 2.5 % rectal cream Place 1 application rectally 2 (two) times daily as needed (for rectal bleeding).    . hydrOXYzine (ATARAX/VISTARIL) 25 MG tablet Take 1 tablet (25 mg total) by mouth every 8 (eight) hours as needed for anxiety or itching. 30 tablet 0  . insulin detemir (LEVEMIR FLEXTOUCH) 100 UNIT/ML FlexPen Inject 30 Units into the skin at bedtime. 15 mL 3  . latanoprost (XALATAN) 0.005 % ophthalmic solution Place 1 drop into both eyes at bedtime.    Marland Kitchen loratadine (CLARITIN) 10 MG tablet Take 10 mg by mouth daily.    Marland Kitchen lubiprostone (AMITIZA) 24 MCG capsule Take 1 capsule (24 mcg total) by mouth 2 (two) times daily with a meal. 60 capsule 5  . metoprolol succinate (TOPROL-XL) 25 MG 24 hr tablet Take 3 tablets (75 mg total) by mouth 2 (two) times daily. 90 tablet 3  . midodrine (PROAMATINE) 2.5 MG tablet Take 3 tablets (7.5 mg total) by mouth 3 (three) times daily.    . mirabegron ER (MYRBETRIQ) 25 MG TB24 tablet Take 25 mg by mouth daily.    . mirtazapine (REMERON) 30 MG tablet Take 30 mg by mouth at bedtime.    . ondansetron (ZOFRAN) 4 MG tablet Take 1 tablet (4 mg total) by mouth 2 (two) times daily as needed for nausea or vomiting.    . pantoprazole (PROTONIX) 40 MG tablet Take 1  tablet (40 mg total) by mouth daily. 270 tablet 3  . polyethylene glycol (MIRALAX / GLYCOLAX) 17 g packet Take 17 g by mouth daily. 14 each 0  . risperiDONE (RISPERDAL) 1 MG tablet Take 1 tablet (1 mg total) by mouth in the morning and at bedtime. 60 tablet 4  . senna (SENOKOT) 8.6 MG TABS tablet Take 1 tablet (8.6 mg total) by mouth daily. 120 tablet 0  . simvastatin (ZOCOR) 20 MG tablet Take 20 mg by mouth daily at 6 PM.    . TRADJENTA 5 MG TABS tablet TAKE (1) TABLET BY MOUTH ONCE DAILY. 30 tablet 3   No current facility-administered medications for this visit.   Allergies:  Sulfa antibiotics  ROS: No syncope.  Physical Exam: VS:  BP (!) 98/50   Pulse 60   Ht 5\' 4"  (1.626 m)   Wt 122 lb (55.3 kg)   SpO2 93%   BMI 20.94 kg/m , BMI Body mass index is 20.94 kg/m.  Wt Readings from Last 3 Encounters:  05/11/20 122 lb (55.3 kg)  04/23/20 127 lb 6.8 oz (57.8 kg)  11/26/19 127 lb 3.2 oz (57.7 kg)    General: Chronically ill-appearing woman, in wheelchair. HEENT: Conjunctiva and lids normal, wearing a mask. Neck: Supple, no elevated JVP or carotid bruits, no thyromegaly. Lungs: Clear to auscultation, nonlabored breathing at rest. Cardiac: Regular rate and rhythm, no S3, 2/6 systolic murmur. Abdomen: Soft, nontender, bowel sounds present. Extremities: No pitting edema.  ECG:  An ECG dated 04/18/2020 was personally reviewed today and demonstrated:  Atrial fibrillation/atypical flutter with RVR, nonspecific ST changes.  Recent Labwork: 10/04/2019: B Natriuretic Peptide 311.0 04/18/2020: TSH 0.293 04/21/2020: ALT 13; AST 17 04/25/2020: Magnesium 2.0 05/04/2020: BUN 11; Creat 0.69; Hemoglobin 12.1; Platelets 398; Potassium 4.4; Sodium 138     Component Value Date/Time   CHOL 151 10/05/2019 0457   TRIG 149 10/05/2019 0457   HDL 40 (L) 10/05/2019 0457   CHOLHDL 3.8 10/05/2019 0457   VLDL 30 10/05/2019 0457   LDLCALC 81 10/05/2019 0457    Other Studies Reviewed  Today:  Echocardiogram 10/05/2019: 1. Left ventricular ejection fraction, by estimation, is 60 to 65%. The  left ventricle has normal function. The left ventricle has no regional  wall motion abnormalities. There is moderate concentric left ventricular  hypertrophy and severe basal septal  hypertrophy .  2. Right ventricular systolic function is normal. The right ventricular  size is normal. Tricuspid regurgitation signal is inadequate for assessing  PA pressure.  3. Left atrial size was mildly dilated.  4. The mitral valve is normal in structure. Mild mitral valve  regurgitation. No evidence of mitral stenosis. Moderate mitral annular  calcification.  5. The aortic valve is tricuspid. Aortic valve regurgitation is not  visualized. Mild to moderate aortic valve sclerosis/calcification is  present, without any evidence of aortic stenosis.  6. The inferior vena cava is normal in size with greater than 50%  respiratory variability, suggesting right atrial pressure of 3 mmHg.   Assessment and Plan:  1.  Paroxysmal to persistent atrial fibrillation with CHA2DS2-VASc score of 6.  Recent hospitalization reviewed.  She is back in sinus rhythm by ECG today.  Continue Eliquis for stroke prophylaxis, also current doses of Cardizem CD and Toprol-XL.  2.  Mixed hyperlipidemia, on Zocor.  LDL 81.  3.  Orthostatic hypotension, currently asymptomatic on midodrine.  Medication Adjustments/Labs and Tests Ordered: Current medicines are reviewed at length with the patient today.  Concerns regarding medicines are outlined above.   Tests Ordered: Orders Placed This Encounter  Procedures  . EKG 12-Lead    Medication Changes: No orders of the defined types were placed in this encounter.   Disposition:  Follow up 6 months.  Signed, Satira Sark, MD, [facility] 05/11/2020 10:40 AM    [facility] at [place], [place], [place] [address] Phone: [phone];  Fax: [phone]

## 2020-05-12 ENCOUNTER — Other Ambulatory Visit: Payer: Self-pay | Admitting: "Endocrinology

## 2020-05-13 ENCOUNTER — Other Ambulatory Visit: Payer: Self-pay | Admitting: Nurse Practitioner

## 2020-05-13 ENCOUNTER — Ambulatory Visit: Payer: Medicare Other | Admitting: Nurse Practitioner

## 2020-05-18 DIAGNOSIS — U071 COVID-19: Secondary | ICD-10-CM | POA: Diagnosis not present

## 2020-05-18 DIAGNOSIS — Z20828 Contact with and (suspected) exposure to other viral communicable diseases: Secondary | ICD-10-CM | POA: Diagnosis not present

## 2020-05-20 DIAGNOSIS — M79671 Pain in right foot: Secondary | ICD-10-CM | POA: Diagnosis not present

## 2020-05-20 DIAGNOSIS — I739 Peripheral vascular disease, unspecified: Secondary | ICD-10-CM | POA: Diagnosis not present

## 2020-05-20 DIAGNOSIS — E1140 Type 2 diabetes mellitus with diabetic neuropathy, unspecified: Secondary | ICD-10-CM | POA: Diagnosis not present

## 2020-05-20 DIAGNOSIS — M79675 Pain in left toe(s): Secondary | ICD-10-CM | POA: Diagnosis not present

## 2020-05-20 DIAGNOSIS — M79672 Pain in left foot: Secondary | ICD-10-CM | POA: Diagnosis not present

## 2020-05-20 DIAGNOSIS — M79674 Pain in right toe(s): Secondary | ICD-10-CM | POA: Diagnosis not present

## 2020-05-24 ENCOUNTER — Ambulatory Visit: Payer: Medicare Other | Admitting: Nurse Practitioner

## 2020-05-25 ENCOUNTER — Ambulatory Visit: Payer: Medicare Other | Admitting: Nurse Practitioner

## 2020-05-25 DIAGNOSIS — Z20828 Contact with and (suspected) exposure to other viral communicable diseases: Secondary | ICD-10-CM | POA: Diagnosis not present

## 2020-05-25 DIAGNOSIS — U071 COVID-19: Secondary | ICD-10-CM | POA: Diagnosis not present

## 2020-05-31 ENCOUNTER — Other Ambulatory Visit (HOSPITAL_COMMUNITY): Payer: Medicare Other

## 2020-05-31 DIAGNOSIS — I951 Orthostatic hypotension: Secondary | ICD-10-CM | POA: Diagnosis not present

## 2020-05-31 DIAGNOSIS — E1142 Type 2 diabetes mellitus with diabetic polyneuropathy: Secondary | ICD-10-CM | POA: Diagnosis not present

## 2020-05-31 DIAGNOSIS — R6889 Other general symptoms and signs: Secondary | ICD-10-CM | POA: Diagnosis not present

## 2020-06-01 ENCOUNTER — Encounter (HOSPITAL_COMMUNITY): Payer: Self-pay

## 2020-06-01 ENCOUNTER — Encounter (HOSPITAL_COMMUNITY)
Admission: RE | Admit: 2020-06-01 | Discharge: 2020-06-01 | Disposition: A | Discharge status: Home / Self Care | Payer: Medicare Other | Source: Ambulatory Visit | Attending: Nurse Practitioner | Admitting: Nurse Practitioner

## 2020-06-01 ENCOUNTER — Other Ambulatory Visit (HOSPITAL_COMMUNITY): Payer: Medicare Other

## 2020-06-01 DIAGNOSIS — E0590 Thyrotoxicosis, unspecified without thyrotoxic crisis or storm: Secondary | ICD-10-CM | POA: Insufficient documentation

## 2020-06-01 DIAGNOSIS — Z794 Long term (current) use of insulin: Secondary | ICD-10-CM | POA: Diagnosis not present

## 2020-06-01 DIAGNOSIS — R6889 Other general symptoms and signs: Secondary | ICD-10-CM | POA: Diagnosis not present

## 2020-06-01 DIAGNOSIS — E1142 Type 2 diabetes mellitus with diabetic polyneuropathy: Secondary | ICD-10-CM | POA: Diagnosis not present

## 2020-06-01 DIAGNOSIS — F209 Schizophrenia, unspecified: Secondary | ICD-10-CM | POA: Diagnosis not present

## 2020-06-01 DIAGNOSIS — I69354 Hemiplegia and hemiparesis following cerebral infarction affecting left non-dominant side: Secondary | ICD-10-CM | POA: Diagnosis not present

## 2020-06-01 DIAGNOSIS — I4891 Unspecified atrial fibrillation: Secondary | ICD-10-CM | POA: Diagnosis not present

## 2020-06-01 DIAGNOSIS — R7989 Other specified abnormal findings of blood chemistry: Secondary | ICD-10-CM | POA: Insufficient documentation

## 2020-06-01 DIAGNOSIS — I951 Orthostatic hypotension: Secondary | ICD-10-CM | POA: Diagnosis not present

## 2020-06-01 HISTORY — DX: Systemic involvement of connective tissue, unspecified: M35.9

## 2020-06-01 MED ORDER — SODIUM IODIDE I-123 7.4 MBQ CAPS
300.0000 | ORAL_CAPSULE | Freq: Once | ORAL | Status: AC
  Administered 2020-06-01: 300 via ORAL

## 2020-06-02 ENCOUNTER — Encounter (HOSPITAL_COMMUNITY)
Admission: RE | Admit: 2020-06-02 | Discharge: 2020-06-02 | Disposition: A | Discharge status: Home / Self Care | Payer: Medicare Other | Source: Ambulatory Visit | Attending: Nurse Practitioner | Admitting: Nurse Practitioner

## 2020-06-02 DIAGNOSIS — E0590 Thyrotoxicosis, unspecified without thyrotoxic crisis or storm: Secondary | ICD-10-CM | POA: Diagnosis not present

## 2020-06-02 DIAGNOSIS — E041 Nontoxic single thyroid nodule: Secondary | ICD-10-CM | POA: Diagnosis not present

## 2020-06-03 DIAGNOSIS — R7989 Other specified abnormal findings of blood chemistry: Secondary | ICD-10-CM | POA: Diagnosis not present

## 2020-06-03 DIAGNOSIS — I69354 Hemiplegia and hemiparesis following cerebral infarction affecting left non-dominant side: Secondary | ICD-10-CM | POA: Diagnosis not present

## 2020-06-03 DIAGNOSIS — E0590 Thyrotoxicosis, unspecified without thyrotoxic crisis or storm: Secondary | ICD-10-CM | POA: Diagnosis not present

## 2020-06-03 DIAGNOSIS — I951 Orthostatic hypotension: Secondary | ICD-10-CM | POA: Diagnosis not present

## 2020-06-03 DIAGNOSIS — F209 Schizophrenia, unspecified: Secondary | ICD-10-CM | POA: Diagnosis not present

## 2020-06-03 DIAGNOSIS — R6889 Other general symptoms and signs: Secondary | ICD-10-CM | POA: Diagnosis not present

## 2020-06-03 DIAGNOSIS — E1142 Type 2 diabetes mellitus with diabetic polyneuropathy: Secondary | ICD-10-CM | POA: Diagnosis not present

## 2020-06-03 DIAGNOSIS — I4891 Unspecified atrial fibrillation: Secondary | ICD-10-CM | POA: Diagnosis not present

## 2020-06-04 DIAGNOSIS — I951 Orthostatic hypotension: Secondary | ICD-10-CM | POA: Diagnosis not present

## 2020-06-04 DIAGNOSIS — I4891 Unspecified atrial fibrillation: Secondary | ICD-10-CM | POA: Diagnosis not present

## 2020-06-04 DIAGNOSIS — F209 Schizophrenia, unspecified: Secondary | ICD-10-CM | POA: Diagnosis not present

## 2020-06-04 DIAGNOSIS — E1142 Type 2 diabetes mellitus with diabetic polyneuropathy: Secondary | ICD-10-CM | POA: Diagnosis not present

## 2020-06-04 DIAGNOSIS — R6889 Other general symptoms and signs: Secondary | ICD-10-CM | POA: Diagnosis not present

## 2020-06-04 DIAGNOSIS — I69354 Hemiplegia and hemiparesis following cerebral infarction affecting left non-dominant side: Secondary | ICD-10-CM | POA: Diagnosis not present

## 2020-06-04 LAB — TSH: TSH: 0.869 u[IU]/mL (ref 0.450–4.500)

## 2020-06-04 LAB — THYROID PEROXIDASE ANTIBODY: Thyroperoxidase Ab SerPl-aCnc: <8 IU/mL (ref 0–34)

## 2020-06-04 LAB — T4, FREE: Free T4: 1.13 ng/dL (ref 0.82–1.77)

## 2020-06-04 LAB — THYROGLOBULIN ANTIBODY: Thyroglobulin Antibody: <1 IU/mL (ref 0.0–0.9)

## 2020-06-04 LAB — T3, FREE: T3, Free: 3.2 pg/mL (ref 2.0–4.4)

## 2020-06-07 DIAGNOSIS — I951 Orthostatic hypotension: Secondary | ICD-10-CM | POA: Diagnosis not present

## 2020-06-07 DIAGNOSIS — I69354 Hemiplegia and hemiparesis following cerebral infarction affecting left non-dominant side: Secondary | ICD-10-CM | POA: Diagnosis not present

## 2020-06-07 DIAGNOSIS — I4891 Unspecified atrial fibrillation: Secondary | ICD-10-CM | POA: Diagnosis not present

## 2020-06-07 DIAGNOSIS — F209 Schizophrenia, unspecified: Secondary | ICD-10-CM | POA: Diagnosis not present

## 2020-06-07 DIAGNOSIS — R6889 Other general symptoms and signs: Secondary | ICD-10-CM | POA: Diagnosis not present

## 2020-06-07 DIAGNOSIS — E1142 Type 2 diabetes mellitus with diabetic polyneuropathy: Secondary | ICD-10-CM | POA: Diagnosis not present

## 2020-06-08 ENCOUNTER — Other Ambulatory Visit: Payer: Self-pay

## 2020-06-08 ENCOUNTER — Encounter: Payer: Self-pay | Admitting: Nurse Practitioner

## 2020-06-08 ENCOUNTER — Ambulatory Visit (INDEPENDENT_AMBULATORY_CARE_PROVIDER_SITE_OTHER): Payer: Medicare Other | Admitting: Nurse Practitioner

## 2020-06-08 VITALS — BP 116/71 | HR 66 | Ht 64.0 in

## 2020-06-08 DIAGNOSIS — U071 COVID-19: Secondary | ICD-10-CM | POA: Diagnosis not present

## 2020-06-08 DIAGNOSIS — E0590 Thyrotoxicosis, unspecified without thyrotoxic crisis or storm: Secondary | ICD-10-CM

## 2020-06-08 DIAGNOSIS — E782 Mixed hyperlipidemia: Secondary | ICD-10-CM

## 2020-06-08 DIAGNOSIS — I959 Hypotension, unspecified: Secondary | ICD-10-CM | POA: Diagnosis not present

## 2020-06-08 DIAGNOSIS — I10 Essential (primary) hypertension: Secondary | ICD-10-CM

## 2020-06-08 DIAGNOSIS — Z20828 Contact with and (suspected) exposure to other viral communicable diseases: Secondary | ICD-10-CM | POA: Diagnosis not present

## 2020-06-08 DIAGNOSIS — R7989 Other specified abnormal findings of blood chemistry: Secondary | ICD-10-CM | POA: Diagnosis not present

## 2020-06-08 DIAGNOSIS — E041 Nontoxic single thyroid nodule: Secondary | ICD-10-CM

## 2020-06-08 DIAGNOSIS — E1159 Type 2 diabetes mellitus with other circulatory complications: Secondary | ICD-10-CM

## 2020-06-08 LAB — POCT GLYCOSYLATED HEMOGLOBIN (HGB A1C): Hemoglobin A1C: 8.2 % — AB (ref 4.0–5.6)

## 2020-06-08 MED ORDER — LEVEMIR FLEXTOUCH 100 UNIT/ML ~~LOC~~ SOPN: 45.0000 [IU] | PEN_INJECTOR | Freq: Every day | SUBCUTANEOUS | 3 refills | Status: DC

## 2020-06-08 NOTE — Patient Instructions (Signed)
Thyroid Nodule  A thyroid nodule is an isolated growth of thyroid cells that forms a lump in your thyroid gland. The thyroid gland is a butterfly-shaped gland. It is found in the lower front of your neck. This gland sends chemical messengers (hormones) through your blood to all parts of your body. These hormones are important in regulating your body temperature and helping your body to use energy. Thyroid nodules are common. Most are not cancerous (benign). You may have one nodule or several nodules. Different types of thyroid nodules include nodules that:  Grow and fill with fluid (thyroid cysts).  Produce too much thyroid hormone (hot nodules or hyperthyroid).  Produce no thyroid hormone (cold nodules or hypothyroid).  Form from cancer cells (thyroid cancers). What are the causes? In most cases, the cause of this condition is not known. What increases the risk? The following factors may make you more likely to develop this condition.  Age. Thyroid nodules become more common in people who are older than 75 years of age.  Gender. ? Benign thyroid nodules are more common in women. ? Cancerous (malignant) thyroid nodules are more common in men.  A family history that includes: ? Thyroid nodules. ? Pheochromocytoma. ? Thyroid carcinoma. ? Hyperparathyroidism.  Certain kinds of thyroid diseases, such as Hashimoto's thyroiditis.  Lack of iodine in your diet.  A history of head and neck radiation, such as from previous cancer treatment. What are the signs or symptoms? In many cases, there are no symptoms. If you have symptoms, they may include:  A lump in your lower neck.  Feeling a lump or tickle in your throat.  Pain in your neck, jaw, or ear.  Having trouble swallowing. Hot nodules may cause symptoms that include:  Weight loss.  Warm, flushed skin.  Feeling hot.  Feeling nervous.  A racing heartbeat. Cold nodules may cause symptoms that include:  Weight  gain.  Dry skin.  Brittle hair. This may also occur with hair loss.  Feeling cold.  Fatigue. Thyroid cancer nodules may cause symptoms that include:  Hard nodules that feel stuck to the thyroid gland.  Hoarseness.  Lumps in the glands near your thyroid (lymph nodes). How is this diagnosed? A thyroid nodule may be felt by your health care provider during a physical exam. This condition may also be diagnosed based on your symptoms. You may also have tests, including:  An ultrasound. This may be done to confirm the diagnosis.  A biopsy. This involves taking a sample from the nodule and looking at it under a microscope.  Blood tests to make sure that your thyroid is working properly.  A thyroid scan. This test uses a radioactive tracer injected into a vein to create an image of the thyroid gland on a computer screen.  Imaging tests such as MRI or CT scan. These may be done if: ? Your nodule is large. ? Your nodule is blocking your airway. ? Cancer is suspected. How is this treated? Treatment depends on the cause and size of your nodule or nodules. If the nodule is benign, treatment may not be necessary. Your health care provider may monitor the nodule to see if it goes away without treatment. If the nodule continues to grow, is cancerous, or does not go away, treatment may be needed. Treatment may include:  Having a cystic nodule drained with a needle.  Ablation therapy. In this treatment, alcohol is injected into the area of the nodule to destroy the cells. Ablation with heat (  thermal ablation) may also be used.  Radioactive iodine. In this treatment, radioactive iodine is given as a pill or liquid that you drink. This substance causes the thyroid nodule to shrink.  Surgery to remove the nodule. Part or all of your thyroid gland may need to be removed as well.  Medicines. Follow these instructions at home:  Pay attention to any changes in your nodule.  Take  over-the-counter and prescription medicines only as told by your health care provider.  Keep all follow-up visits as told by your health care provider. This is important. Contact a health care provider if:  Your voice changes.  You have trouble swallowing.  You have pain in your neck, ear, or jaw that is getting worse.  Your nodule gets bigger.  Your nodule starts to make it harder for you to breathe.  Your muscles look like they are shrinking (muscle wasting). Get help right away if:  You have chest pain.  There is a loss of consciousness.  You have a sudden fever.  You feel confused.  You are seeing or hearing things that other people do not see or hear (having hallucinations).  You feel very weak.  You have mood swings.  You feel very restless.  You feel suddenly nauseous or throw up.  You suddenly have diarrhea. Summary  A thyroid nodule is an isolated growth of thyroid cells that forms a lump in your thyroid gland.  Thyroid nodules are common. Most are not cancerous (benign). You may have one nodule or several nodules.  Treatment depends on the cause and size of your nodule or nodules. If the nodule is benign, treatment may not be necessary.  Your health care provider may monitor the nodule to see if it goes away without treatment. If the nodule continues to grow, is cancerous, or does not go away, treatment may be needed. This information is not intended to replace advice given to you by your health care provider. Make sure you discuss any questions you have with your health care provider. Document Revised: 08/24/2017 Document Reviewed: 08/27/2017 Elsevier Patient Education  [place].

## 2020-06-08 NOTE — Progress Notes (Signed)
06/08/2020  Endocrinology follow-up note  -She was assisted by her caregiver from nursing home.     Subjective:    Patient ID: [name], female    DOB: [birth year],    Past Medical History:  Diagnosis Date  . Anxiety   . Arthritis   . Atrial fibrillation ([place])   . Collagen vascular disease ([place])   . COVID-19 virus infection    COVID-19+ approx 01/24/19; asymptomatic course with full recovery  . Dependence on wheelchair    pivot/transfers  . Depression    History of psychosis and previous suicide attempt  . DVT, lower extremity, recurrent (HCC)    Long-term Coumadin per Dr. Legrand Rams  . Essential hypertension   . GERD (gastroesophageal reflux disease)   . Hemiplegia ([place]) 2010   Left side  . History of stroke    Acute infarct and right cerebral white matter small vessel disease 12/10  . Leg DVT (deep venous thromboembolism), acute ([place]) 2006  . Schizophrenia ([place])   . Stroke [facility])    left sided weakness  . Type 2 diabetes mellitus ([place])    Past Surgical History:  Procedure Laterality Date  . BACK SURGERY    . BIOPSY N/A 11/24/2014   Procedure: BIOPSY;  Surgeon: Danie Binder, MD;  Location: [facility];  Service: Endoscopy;  Laterality: N/A;  . BIOPSY  05/17/2016   Procedure: BIOPSY;  Surgeon: Danie Binder, MD;  Location: [facility];  Service: Endoscopy;;  gastric biopsy  . COLONOSCOPY WITH PROPOFOL N/A 11/24/2014   Dr. Rudie Meyer polyps removed/moderate sized internal hemorrhoids, tubular adenomas. Next surveillance in 3 years  . ESOPHAGOGASTRODUODENOSCOPY (EGD) WITH PROPOFOL N/A 11/24/2014   Dr. Clayburn Pert HH/patent stricture at the gastroesophageal junction, mild non-erosive gastritis, path negative for H.pylori or celiac sprue  . ESOPHAGOGASTRODUODENOSCOPY (EGD) WITH PROPOFOL N/A 05/17/2016   Procedure: ESOPHAGOGASTRODUODENOSCOPY (EGD) WITH PROPOFOL;  Surgeon: Danie Binder, MD;  Location: [facility];  Service: Endoscopy;  Laterality: N/A;  12:45pm  . GIVENS  CAPSULE STUDY N/A 12/11/2014   MULTILPLE EROSION IN the stomach WITH ACTIVE OOZING. OCCASIONAL EROSIONS AND RARE ULCER SEEN IN PROXIMAL SMALL BOWEL . No masses or AVMs SEEN. NO OLD BLOOD OR FRESH BLOOD SEEN.   . POLYPECTOMY N/A 11/24/2014   Procedure: POLYPECTOMY;  Surgeon: Danie Binder, MD;  Location: [facility];  Service: Endoscopy;  Laterality: N/A;  . SAVORY DILATION N/A 05/17/2016   Procedure: SAVORY DILATION;  Surgeon: Danie Binder, MD;  Location: [facility];  Service: Endoscopy;  Laterality: N/A;   Family History  Problem Relation Age of Onset  . Hypertension Mother   . Colon cancer Neg Hx     Social History   Socioeconomic History  . Marital status: Widowed    Spouse name: Not on file  . Number of children: Not on file  . Years of education: Not on file  . Highest education level: Not on file  Occupational History  . Not on file  Tobacco Use  . Smoking status: Former Smoker    Packs/day: 0.25    Years: 20.00    Pack years: 5.00    Types: Cigarettes    Quit date: 01/24/1995    Years since quitting: 25.3  . Smokeless tobacco: Never Used  Vaping Use  . Vaping Use: Never used  Substance and Sexual Activity  . Alcohol use: No    Alcohol/week: 0.0 standard drinks  . Drug use: No  . Sexual activity: Never  Birth control/protection: Post-menopausal  Other Topics Concern  . Not on file  Social History Narrative  . Not on file   Social Determinants of Health   Financial Resource Strain: Not on file  Food Insecurity: Not on file  Transportation Needs: Not on file  Physical Activity: Not on file  Stress: Not on file  Social Connections: Not on file   Outpatient Encounter Medications as of 06/08/2020  Medication Sig  . acetaminophen (TYLENOL) 325 MG tablet Take 650 mg by mouth 3 (three) times daily as needed for mild pain or moderate pain.   Marland Kitchen apixaban (ELIQUIS) 5 MG TABS tablet Take 1 tablet (5 mg total) by mouth 2 (two) times daily.  . calcium carbonate (TUMS -  DOSED IN MG ELEMENTAL CALCIUM) 500 MG chewable tablet Chew 1 tablet by mouth 4 (four) times daily as needed for heartburn.  . Carboxymethylcellulose Sodium (ARTIFICIAL TEARS OP) Place 1 drop into both eyes 3 (three) times daily.  . Cholecalciferol (VITAMIN D3) 50 MCG (2000 UT) TABS Take 2,000 Units by mouth daily.  . clonazePAM (KLONOPIN) 0.5 MG tablet Take 1 tablet (0.5 mg total) by mouth 3 (three) times daily as needed for anxiety.  Marland Kitchen diltiazem (DILACOR XR) 120 MG 24 hr capsule Take 120 mg by mouth daily.  . DULoxetine (CYMBALTA) 60 MG capsule Take 60 mg by mouth daily.  Regino Schultze TEST test strip USE TO CHECK BLOOD SUGAR TWICE DAILY.  Marland Kitchen gabapentin (NEURONTIN) 300 MG capsule Take 1 capsule (300 mg total) by mouth 3 (three) times daily as needed.  Marland Kitchen HYDROcodone-acetaminophen (NORCO) 10-325 MG tablet Take 1 tablet by mouth 3 (three) times daily as needed for severe pain.  . hydrocortisone (ANUSOL-HC) 2.5 % rectal cream Place 1 application rectally 2 (two) times daily as needed (for rectal bleeding).  . hydrOXYzine (ATARAX/VISTARIL) 25 MG tablet Take 1 tablet (25 mg total) by mouth every 8 (eight) hours as needed for anxiety or itching.  . insulin detemir (LEVEMIR FLEXTOUCH) 100 UNIT/ML FlexPen Inject 45 Units into the skin at bedtime.  . Lancets 28G MISC USE TO CHECK BLOOD SUGAR TWICE DAILY.  Marland Kitchen latanoprost (XALATAN) 0.005 % ophthalmic solution Place 1 drop into both eyes at bedtime.  Marland Kitchen loratadine (CLARITIN) 10 MG tablet Take 10 mg by mouth daily.  Marland Kitchen lubiprostone (AMITIZA) 24 MCG capsule Take 1 capsule (24 mcg total) by mouth 2 (two) times daily with a meal.  . metoprolol succinate (TOPROL-XL) 25 MG 24 hr tablet Take 3 tablets (75 mg total) by mouth 2 (two) times daily.  . midodrine (PROAMATINE) 2.5 MG tablet Take 3 tablets (7.5 mg total) by mouth 3 (three) times daily.  . mirabegron ER (MYRBETRIQ) 25 MG TB24 tablet Take 25 mg by mouth daily.  . mirtazapine (REMERON) 30 MG tablet Take 30 mg by mouth  at bedtime.  . ondansetron (ZOFRAN) 4 MG tablet Take 1 tablet (4 mg total) by mouth 2 (two) times daily as needed for nausea or vomiting.  . pantoprazole (PROTONIX) 40 MG tablet Take 1 tablet (40 mg total) by mouth daily.  . polyethylene glycol (MIRALAX / GLYCOLAX) 17 g packet Take 17 g by mouth daily.  . risperiDONE (RISPERDAL) 1 MG tablet Take 1 tablet (1 mg total) by mouth in the morning and at bedtime.  . senna (SENOKOT) 8.6 MG TABS tablet Take 1 tablet (8.6 mg total) by mouth daily.  . simvastatin (ZOCOR) 20 MG tablet Take 20 mg by mouth daily at 6 PM.  . TRADJENTA  5 MG TABS tablet TAKE (1) TABLET BY MOUTH ONCE DAILY.  . [DISCONTINUED] insulin detemir (LEVEMIR FLEXTOUCH) 100 UNIT/ML FlexPen Inject 30 Units into the skin at bedtime.   No facility-administered encounter medications on file as of 06/08/2020.   ALLERGIES: Allergies  Allergen Reactions  . Sulfa Antibiotics Rash   VACCINATION STATUS: There is no immunization history for the selected administration types on file for this patient.  Diabetes She presents for her follow-up diabetic visit. She has type 2 diabetes mellitus. Onset time: She was diagnosed at approximate age of 22 years. Her disease course has been improving. There are no hypoglycemic associated symptoms. Pertinent negatives for hypoglycemia include no confusion, pallor or seizures. Associated symptoms include fatigue and polyuria. Pertinent negatives for diabetes include no polydipsia, no polyphagia and no weight loss. There are no hypoglycemic complications. Symptoms are improving. Diabetic complications include a CVA and heart disease. Risk factors for coronary artery disease include dyslipidemia, diabetes mellitus, obesity, sedentary lifestyle and hypertension. Current diabetic treatment includes oral agent (monotherapy) and insulin injections. She is compliant with treatment all of the time. Her weight is stable. She is following a generally unhealthy diet. When asked  about meal planning, she reported none. She has not had a previous visit with a dietitian. She never participates in exercise. Her home blood glucose trend is decreasing steadily. Her breakfast blood glucose range is generally 180-200 mg/dl. Her bedtime blood glucose range is generally >200 mg/dl. (She presents today with her logs from the nursing home showing improved, yet still above target fasting and postprandial glycemic profile.  Her POCT A1c today is 8.2%, improving from last visit of 9.2%.  There are no episodes of hypoglycemia documented or reported.  ) An ACE inhibitor/angiotensin II receptor blocker is not being taken. She does not see a podiatrist.Eye exam is current.  Hypertension This is a chronic problem. The current episode started more than 1 year ago. The problem has been gradually improving since onset. The problem is controlled. Pertinent negatives include no palpitations or shortness of breath. There are no associated agents to hypertension. Risk factors for coronary artery disease include dyslipidemia, diabetes mellitus and sedentary lifestyle. Past treatments include beta blockers. The current treatment provides mild improvement. There are no compliance problems.  Hypertensive end-organ damage includes CVA. Identifiable causes of hypertension include a thyroid problem.  Hyperlipidemia This is a chronic problem. The current episode started more than 1 year ago. The problem is controlled. Recent lipid tests were reviewed and are normal. Exacerbating diseases include diabetes. Factors aggravating her hyperlipidemia include beta blockers. Pertinent negatives include no shortness of breath. Current antihyperlipidemic treatment includes statins. The current treatment provides moderate improvement of lipids. There are no compliance problems.  Risk factors for coronary artery disease include diabetes mellitus, dyslipidemia, hypertension, obesity and a sedentary lifestyle.  Thyroid  Problem Presents for initial visit. Symptoms include fatigue. Patient reports no palpitations or weight loss. The symptoms have been improving. Past treatments include beta blockers (on beta blockers for other comorbidities). Prior procedures include radioiodine uptake scan. Her past medical history is significant for atrial fibrillation, diabetes and hyperlipidemia. Risk factors include family history of hyperthyroidism.   [name] is 76 y.o. female who presents today with a medical history as above. she is being seen in consultation for hyperthyroidism requested by [name], MD.  she has been dealing with symptoms of anxiety, tremors, weight loss, and palpitations for a few weeks now. These symptoms are progressively worsening and troubling to her.  her most recent thyroid labs revealed suppressed TSH of 0.293 and high normal FT4 of 1.52 on 04/18/20 during her recent hospitalization for SBO and AFib. she denies dysphagia, choking, shortness of breath, no recent voice change.    she does family history of thyroid dysfunction in her mother (requiring surgery), but denies family hx of thyroid cancer. she denies personal history of goiter. she is not on any anti-thyroid medications nor on any thyroid hormone supplements. Denies use of Biotin containing supplements.  She has never had any previous imaging of her thyroid.  She is willing to proceed with appropriate work up and therapy for thyrotoxicosis.    Review of systems  Constitutional: + rapidly decreasing body weight (per NH staff),  current Body mass index is 20.94 kg/m. , + fatigue, no subjective hyperthermia, no subjective hypothermia Eyes: no blurry vision, no xerophthalmia ENT: no sore throat, no nodules palpated in throat, no dysphagia/odynophagia, no hoarseness Cardiovascular: no chest pain, no shortness of breath, + palpitations (improved), no leg swelling Respiratory: no cough, no shortness of breath Gastrointestinal: no  nausea/vomiting/diarrhea- resolved since hospitalization for SBO Musculoskeletal: no muscle/joint aches, essentially WC bound due to previous CVA Skin: no rashes, no hyperemia Neurological: + tremors (improved), no numbness, no tingling, no dizziness Psychiatric: no depression, + anxiety          Objective:    BP 116/71   Pulse 66   Ht 5\' 4"  (1.626 m)   BMI 20.94 kg/m   Wt Readings from Last 3 Encounters:  05/11/20 122 lb (55.3 kg)  04/23/20 127 lb 6.8 oz (57.8 kg)  11/26/19 127 lb 3.2 oz (57.7 kg)     BP Readings from Last 3 Encounters:  06/08/20 116/71  05/11/20 (!) 98/50  04/28/20 112/64              Physical Exam- Limited  Constitutional:  Body mass index is 20.94 kg/m., not in acute distress, normal state of mind Eyes:  EOMI, no exophthalmos Neck: Supple Thyroid: No gross goiter Cardiovascular: RRR, + murmur, rubs, or gallops, no edema Respiratory: Adequate breathing efforts, no crackles, rales, rhonchi, or wheezing Musculoskeletal: no gross deformities, strength intact in all four extremities, no gross restriction of joint movements Skin:  no rashes, no hyperemia Neurological: + mild tremor with outstretched hands   CMP     Component Value Date/Time   NA 138 05/04/2020 0925   NA 141 09/06/2016 0823   K 4.4 05/04/2020 0925   CL 100 05/04/2020 0925   CO2 30 05/04/2020 0925   GLUCOSE 128 (H) 05/04/2020 0925   BUN 11 05/04/2020 0925   BUN 10 09/06/2016 0823   CREATININE 0.69 05/04/2020 0925   CALCIUM 8.9 05/04/2020 0925   PROT 5.9 (L) 04/21/2020 0512   PROT 7.4 09/06/2016 0823   ALBUMIN 2.8 (L) 04/21/2020 0512   ALBUMIN 4.4 09/06/2016 0823   AST 17 04/21/2020 0512   ALT 13 04/21/2020 0512   ALKPHOS 48 04/21/2020 0512   BILITOT 1.0 04/21/2020 0512   BILITOT 0.3 09/06/2016 0823   GFRNONAA >60 04/25/2020 0618   GFRNONAA 91 06/27/2018 0803   GFRAA >60 10/05/2019 0457   GFRAA 106 06/27/2018 0803     CBC    Component Value Date/Time   WBC 7.0  05/04/2020 0930   RBC 4.16 05/04/2020 0930   HGB 12.1 05/04/2020 0930   HCT 36.5 05/04/2020 0930   PLT 398 05/04/2020 0930   MCV 87.7 05/04/2020 0930   MCH 29.1 05/04/2020 0930  MCHC 33.2 05/04/2020 0930   RDW 13.1 05/04/2020 0930   LYMPHSABS 1.1 04/18/2020 1105   MONOABS 0.6 04/18/2020 1105   EOSABS 0.0 04/18/2020 1105   BASOSABS 0.0 04/18/2020 1105     Diabetic Labs (most recent): Lab Results  Component Value Date   HGBA1C 8.2 (A) 06/08/2020   HGBA1C 9.2 (A) 02/23/2020   HGBA1C 9.2 (H) 02/21/2020    Lipid Panel     Component Value Date/Time   CHOL 151 10/05/2019 0457   TRIG 149 10/05/2019 0457   HDL 40 (L) 10/05/2019 0457   CHOLHDL 3.8 10/05/2019 0457   VLDL 30 10/05/2019 0457   LDLCALC 81 10/05/2019 0457     Lab Results  Component Value Date   TSH 0.869 06/03/2020   TSH 0.293 (L) 04/18/2020   TSH 0.578 10/05/2019   TSH 1.138 03/29/2017   TSH 0.538 02/13/2016   FREET4 1.13 06/03/2020   FREET4 1.52 (H) 04/19/2020        Assessment & Plan:   1) Abnormal TSH- nodular thyroid  she is being seen at a kind request of [name], MD.  her history and most recent labs are reviewed, and she was examined clinically.  Her repeat thyroid function tests show improvement without any intervention.  Her antibody testing was negative, ruling out autoimmune thyroid dysfunction as a cause.  Her uptake and scan was normal aside from cold nodule being found in right mid thyroid, recommending dedicated thyroid ultrasound to assess completely.   Will order thyroid ultrasound for further evaluation.  2) Type 2 diabetes mellitus with vascular disease ([place])  Her diabetes is complicated by recurrent CVA.  She presents today with her logs from the nursing home showing improved, yet still above target fasting and postprandial glycemic profile.  Her POCT A1c today is 8.2%, improving from last visit of 9.2%.  There are no episodes of hypoglycemia documented or reported.     - Patient remains at a high risk for more acute and chronic complications of diabetes which include CAD, CVA, CKD, retinopathy, and neuropathy. These are all discussed in detail with the patient.  - Nutritional counseling repeated at each appointment due to patients tendency to fall back in to old habits.  - The patient admits there is a room for improvement in their diet and drink choices. -  Suggestion is made for the patient to avoid simple carbohydrates from their diet including Cakes, Sweet Desserts / Pastries, Ice Cream, Soda (diet and regular), Sweet Tea, Candies, Chips, Cookies, Sweet Pastries, Store Bought Juices, Alcohol in Excess of 1-2 drinks a day, Artificial Sweeteners, Coffee Creamer, and "Sugar-free" Products. This will help patient to have stable blood glucose profile and potentially avoid unintended weight gain.   - I encouraged the patient to switch to unprocessed or minimally processed complex starch and increased protein intake (animal or plant source), fruits, and vegetables.   - Patient is advised to stick to a routine mealtimes to eat 3 meals a day and avoid unnecessary snacks (to snack only to correct hypoglycemia).  - I have approached patient with the following individualized plan to manage diabetes and patient agrees.  -Based on her response to adjustment of her basal insulin and adding Tradjenta, she will not need prandial insulin for now.    -Based on her continued hyperglycemia, she will tolerate increase in her Levemir to 45 units SQ nightly.  She can continue her Tradjenta 5 mg po daily.  S  -She is encouraged to continue monitoring  blood glucose twice daily, before breakfast and before bed, and to call the clinic if she has readings less than 70 or greater than 300 for 3 tests in a row.  - she is not a suitable candidate for metformin nor SGLT2 inhibitor therapy.    - Patient specific target  for A1c; LDL, HDL, Triglycerides, and  Waist Circumference  were discussed in detail.  -Patient is advised to maintain close follow up with [name], MD for primary care needs.     I spent 42 minutes in the care of the patient today including review of labs from [place], Lipids, Thyroid Function, Hematology (current and previous including abstractions from other facilities); face-to-face time discussing  her blood glucose readings/logs, discussing hypoglycemia and hyperglycemia episodes and symptoms, medications doses, her options of short and long term treatment based on the latest standards of care / guidelines;  discussion about incorporating lifestyle medicine;  and documenting the encounter.    Please refer to Patient Instructions for Blood Glucose Monitoring and Insulin/Medications Dosing Guide"  in media tab for additional information. Please  also refer to " Patient Self Inventory" in the Media  tab for reviewed elements of pertinent patient history.  [name] participated in the discussions, expressed understanding, and voiced agreement with the above plans.  All questions were answered to her satisfaction. she is encouraged to contact clinic should she have any questions or concerns prior to her return visit.    Follow up plan: Return in about 3 months (around 09/08/2020) for Diabetes F/U with A1c in office, Thyroid follow up, thyroid ultrasound, Bring meter and logs.   Thank you for involving me in the care of this pleasant patient, and I will continue to update you with her progress.  Rayetta Pigg, [facility] [address] [place],  [address] Phone: [phone] Fax: [phone]  06/08/2020, 11:44 AM

## 2020-06-09 DIAGNOSIS — I4891 Unspecified atrial fibrillation: Secondary | ICD-10-CM | POA: Diagnosis not present

## 2020-06-09 DIAGNOSIS — I69354 Hemiplegia and hemiparesis following cerebral infarction affecting left non-dominant side: Secondary | ICD-10-CM | POA: Diagnosis not present

## 2020-06-09 DIAGNOSIS — F209 Schizophrenia, unspecified: Secondary | ICD-10-CM | POA: Diagnosis not present

## 2020-06-09 DIAGNOSIS — E1142 Type 2 diabetes mellitus with diabetic polyneuropathy: Secondary | ICD-10-CM | POA: Diagnosis not present

## 2020-06-09 DIAGNOSIS — R6889 Other general symptoms and signs: Secondary | ICD-10-CM | POA: Diagnosis not present

## 2020-06-09 DIAGNOSIS — I951 Orthostatic hypotension: Secondary | ICD-10-CM | POA: Diagnosis not present

## 2020-06-10 DIAGNOSIS — R6889 Other general symptoms and signs: Secondary | ICD-10-CM | POA: Diagnosis not present

## 2020-06-10 DIAGNOSIS — F209 Schizophrenia, unspecified: Secondary | ICD-10-CM | POA: Diagnosis not present

## 2020-06-10 DIAGNOSIS — I4891 Unspecified atrial fibrillation: Secondary | ICD-10-CM | POA: Diagnosis not present

## 2020-06-10 DIAGNOSIS — I951 Orthostatic hypotension: Secondary | ICD-10-CM | POA: Diagnosis not present

## 2020-06-10 DIAGNOSIS — I69354 Hemiplegia and hemiparesis following cerebral infarction affecting left non-dominant side: Secondary | ICD-10-CM | POA: Diagnosis not present

## 2020-06-10 DIAGNOSIS — E1142 Type 2 diabetes mellitus with diabetic polyneuropathy: Secondary | ICD-10-CM | POA: Diagnosis not present

## 2020-06-10 NOTE — Progress Notes (Signed)
[facility] MD/PA/NP OP Progress Note  06/14/2020 11:33 AM [name]  MRN:  [id number]  Chief Complaint:  Chief Complaint    Follow-up     HPI:  - She was admitted and treated for SBO and Afib since the last visit.    This is a follow-up appointment for schizophrenia and depression.  She states that she continues to feel nervous.  She enjoys going to church.  She does not go outside as much as she wants to stay in the room.  She enjoys meeting with her daughter, granddaughter, who she contacts with every day.  She is concerned that her other provider has been trying to discontinue her hypnotics.  She has fair appetite.  She has occasional insomnia.  She has depressive symptoms as in PHQ-9.  She denies SI.  She denies AH, VH.  She denies paranoia.  She feels comfortable to stay on the current medication regimen.   Star, her caregiver presents to the interview.  There has been no significant change since the last visit.  Although A[place] has some movement of arguing with staffs, there has been no behavior issues.  She usually comes down after staffs talking into her.  She takes medication regularly.    Employment:unemployed.used to work for [employer], on disability around ten years ago after her back surgery Marital status:[name] husband deceased after 38 years of marriage in 2010 Number of children:59, 71 year olddaughter. She has a granddaughter   Wt Readings from Last 3 Encounters:  05/11/20 122 lb (55.3 kg)  04/23/20 127 lb 6.8 oz (57.8 kg)  11/26/19 127 lb 3.2 oz (57.7 kg)     Visit Diagnosis:    ICD-10-CM   1. Schizophrenia, unspecified type ([place])  F20.9   2. Mild episode of recurrent major depressive disorder (HCC)  F33.0     Past Psychiatric History: Please see initial evaluation for full details. I have reviewed the history. No updates at this time.     Past Medical History:  Past Medical History:  Diagnosis Date  . Anxiety   . Arthritis   . Atrial  fibrillation ([place])   . Collagen vascular disease ([place])   . COVID-19 virus infection    COVID-19+ approx 01/24/19; asymptomatic course with full recovery  . Dependence on wheelchair    pivot/transfers  . Depression    History of psychosis and previous suicide attempt  . DVT, lower extremity, recurrent (HCC)    Long-term Coumadin per Dr. Legrand Rams  . Essential hypertension   . GERD (gastroesophageal reflux disease)   . Hemiplegia ([place]) 2010   Left side  . History of stroke    Acute infarct and right cerebral white matter small vessel disease 12/10  . Leg DVT (deep venous thromboembolism), acute ([place]) 2006  . Schizophrenia ([place])   . Stroke [facility])    left sided weakness  . Type 2 diabetes mellitus ([place])     Past Surgical History:  Procedure Laterality Date  . BACK SURGERY    . BIOPSY N/A 11/24/2014   Procedure: BIOPSY;  Surgeon: Danie Binder, MD;  Location: [facility];  Service: Endoscopy;  Laterality: N/A;  . BIOPSY  05/17/2016   Procedure: BIOPSY;  Surgeon: Danie Binder, MD;  Location: [facility];  Service: Endoscopy;;  gastric biopsy  . COLONOSCOPY WITH PROPOFOL N/A 11/24/2014   Dr. Rudie Meyer polyps removed/moderate sized internal hemorrhoids, tubular adenomas. Next surveillance in 3 years  . ESOPHAGOGASTRODUODENOSCOPY (EGD) WITH PROPOFOL N/A 11/24/2014   Dr.  Fields:small HH/patent stricture at the gastroesophageal junction, mild non-erosive gastritis, path negative for H.pylori or celiac sprue  . ESOPHAGOGASTRODUODENOSCOPY (EGD) WITH PROPOFOL N/A 05/17/2016   Procedure: ESOPHAGOGASTRODUODENOSCOPY (EGD) WITH PROPOFOL;  Surgeon: Danie Binder, MD;  Location: [facility];  Service: Endoscopy;  Laterality: N/A;  12:45pm  . GIVENS CAPSULE STUDY N/A 12/11/2014   MULTILPLE EROSION IN the stomach WITH ACTIVE OOZING. OCCASIONAL EROSIONS AND RARE ULCER SEEN IN PROXIMAL SMALL BOWEL . No masses or AVMs SEEN. NO OLD BLOOD OR FRESH BLOOD SEEN.   . POLYPECTOMY N/A 11/24/2014   Procedure: POLYPECTOMY;   Surgeon: Danie Binder, MD;  Location: [facility];  Service: Endoscopy;  Laterality: N/A;  . SAVORY DILATION N/A 05/17/2016   Procedure: SAVORY DILATION;  Surgeon: Danie Binder, MD;  Location: [facility];  Service: Endoscopy;  Laterality: N/A;    Family Psychiatric History: Please see initial evaluation for full details. I have reviewed the history. No updates at this time.     Family History:  Family History  Problem Relation Age of Onset  . Hypertension Mother   . Colon cancer Neg Hx     Social History:  Social History   Socioeconomic History  . Marital status: Widowed    Spouse name: Not on file  . Number of children: Not on file  . Years of education: Not on file  . Highest education level: Not on file  Occupational History  . Not on file  Tobacco Use  . Smoking status: Former Smoker    Packs/day: 0.25    Years: 20.00    Pack years: 5.00    Types: Cigarettes    Quit date: 01/24/1995    Years since quitting: 25.4  . Smokeless tobacco: Never Used  Vaping Use  . Vaping Use: Never used  Substance and Sexual Activity  . Alcohol use: No    Alcohol/week: 0.0 standard drinks  . Drug use: No  . Sexual activity: Never    Birth control/protection: Post-menopausal  Other Topics Concern  . Not on file  Social History Narrative  . Not on file   Social Determinants of Health   Financial Resource Strain: Not on file  Food Insecurity: Not on file  Transportation Needs: Not on file  Physical Activity: Not on file  Stress: Not on file  Social Connections: Not on file    Allergies:  Allergies  Allergen Reactions  . Sulfa Antibiotics Rash    Metabolic Disorder Labs: Lab Results  Component Value Date   HGBA1C 8.2 (A) 06/08/2020   MPG 217.34 02/21/2020   MPG 257.52 10/05/2019   No results found for: PROLACTIN Lab Results  Component Value Date   CHOL 151 10/05/2019   TRIG 149 10/05/2019   HDL 40 (L) 10/05/2019   CHOLHDL 3.8 10/05/2019   VLDL 30 10/05/2019    LDLCALC 81 10/05/2019   LDLCALC 55 10/07/2016   Lab Results  Component Value Date   TSH 0.869 06/03/2020   TSH 0.293 (L) 04/18/2020    Therapeutic Level Labs: No results found for: LITHIUM No results found for: VALPROATE No components found for:  CBMZ  Current Medications: Current Outpatient Medications  Medication Sig Dispense Refill  . ABILIFY 2 MG tablet Take 2 mg by mouth daily.    Marland Kitchen acetaminophen (TYLENOL) 325 MG tablet Take 650 mg by mouth 3 (three) times daily as needed for mild pain or moderate pain.     Marland Kitchen apixaban (ELIQUIS) 5 MG TABS tablet Take 1  tablet (5 mg total) by mouth 2 (two) times daily. 60 tablet 0  . atorvastatin (LIPITOR) 40 MG tablet Take 1 tablet by mouth daily as needed.    . calcium carbonate (TUMS - DOSED IN MG ELEMENTAL CALCIUM) 500 MG chewable tablet Chew 1 tablet by mouth 4 (four) times daily as needed for heartburn.    . Carboxymethylcellulose Sodium (ARTIFICIAL TEARS OP) Place 1 drop into both eyes 3 (three) times daily.    . Cholecalciferol (VITAMIN D3) 50 MCG (2000 UT) TABS Take 2,000 Units by mouth daily.    . clonazePAM (KLONOPIN) 0.5 MG tablet Take 1 tablet (0.5 mg total) by mouth 3 (three) times daily as needed for anxiety. 12 tablet 0  . diltiazem (CARDIZEM CD) 120 MG 24 hr capsule Take 120 mg by mouth daily.    Marland Kitchen diltiazem (DILACOR XR) 120 MG 24 hr capsule Take 120 mg by mouth daily.    . DULoxetine (CYMBALTA) 60 MG capsule Take 60 mg by mouth daily.    Regino Schultze TEST test strip USE TO CHECK BLOOD SUGAR TWICE DAILY. 100 strip 0  . gabapentin (NEURONTIN) 300 MG capsule Take 1 capsule (300 mg total) by mouth 3 (three) times daily as needed.    Marland Kitchen GOODSENSE ARTIFICIAL TEARS 0.5-0.6 % SOLN Apply to eye.    Marland Kitchen HYDROcodone-acetaminophen (NORCO) 10-325 MG tablet Take 1 tablet by mouth 3 (three) times daily as needed for severe pain. 12 tablet 0  . hydrocortisone (ANUSOL-HC) 2.5 % rectal cream Place 1 application rectally 2 (two) times daily as needed (for  rectal bleeding).    . hydrOXYzine (ATARAX/VISTARIL) 25 MG tablet Take 1 tablet (25 mg total) by mouth every 8 (eight) hours as needed for anxiety or itching. 30 tablet 0  . insulin detemir (LEVEMIR FLEXTOUCH) 100 UNIT/ML FlexPen Inject 45 Units into the skin at bedtime. 15 mL 3  . Lancets 28G MISC USE TO CHECK BLOOD SUGAR TWICE DAILY. 100 each 0  . latanoprost (XALATAN) 0.005 % ophthalmic solution Place 1 drop into both eyes at bedtime.    Marland Kitchen loratadine (CLARITIN) 10 MG tablet Take 10 mg by mouth daily.    Marland Kitchen lubiprostone (AMITIZA) 24 MCG capsule Take 1 capsule (24 mcg total) by mouth 2 (two) times daily with a meal. 60 capsule 5  . metoprolol succinate (TOPROL-XL) 25 MG 24 hr tablet Take 3 tablets (75 mg total) by mouth 2 (two) times daily. 90 tablet 3  . midodrine (PROAMATINE) 2.5 MG tablet Take 3 tablets (7.5 mg total) by mouth 3 (three) times daily.    . mirabegron ER (MYRBETRIQ) 25 MG TB24 tablet Take 25 mg by mouth daily.    . mirtazapine (REMERON) 30 MG tablet Take 30 mg by mouth at bedtime.    . ondansetron (ZOFRAN) 4 MG tablet Take 1 tablet (4 mg total) by mouth 2 (two) times daily as needed for nausea or vomiting.    . pantoprazole (PROTONIX) 40 MG tablet Take 1 tablet (40 mg total) by mouth daily. 270 tablet 3  . polyethylene glycol (MIRALAX / GLYCOLAX) 17 g packet Take 17 g by mouth daily. 14 each 0  . risperiDONE (RISPERDAL) 1 MG tablet Take 1 tablet (1 mg total) by mouth in the morning and at bedtime. 60 tablet 4  . senna (SENOKOT) 8.6 MG TABS tablet Take 1 tablet (8.6 mg total) by mouth daily. 120 tablet 0  . simvastatin (ZOCOR) 20 MG tablet Take 20 mg by mouth daily at 6 PM.    .  TRADJENTA 5 MG TABS tablet TAKE (1) TABLET BY MOUTH ONCE DAILY. 30 tablet 3  . zolpidem (AMBIEN) 5 MG tablet Take 5 mg by mouth at bedtime as needed.     No current facility-administered medications for this visit.     Musculoskeletal: Strength & Muscle Tone: within normal limits Gait & Station: in  wheel chair Patient leans: N/A  Psychiatric Specialty Exam: Review of Systems  Psychiatric/Behavioral: Positive for dysphoric mood and sleep disturbance. Negative for agitation, behavioral problems, confusion, decreased concentration, hallucinations, self-injury and suicidal ideas. The patient is nervous/anxious. The patient is not hyperactive.   All other systems reviewed and are negative.   Blood pressure (!) 88/51, pulse 67, temperature 97.8 F (36.6 C), temperature source Temporal.There is no height or weight on file to calculate BMI.  General Appearance: Fairly Groomed  Eye Contact:  Good  Speech:  Clear and Coherent  Volume:  Normal  Mood:  Anxious and Depressed  Affect:  Appropriate, Congruent and Restricted  Thought Process:  Coherent  Orientation:  Full (Time, Place, and Person)  Thought Content: Logical   Suicidal Thoughts:  No  Homicidal Thoughts:  No  Memory:  Immediate;   Good  Judgement:  Good  Insight:  Present  Psychomotor Activity:  Normal. No rigidity, no resting tremors  Concentration:  Concentration: Good and Attention Span: Good  Recall:  Good  Fund of Knowledge: Good  Language: Good  Akathisia:  No  Handed:  Right  AIMS (if indicated): not done  Assets:  Communication Skills Desire for Improvement  ADL's:  Intact  Cognition: WNL  Sleep:  Fair   Screenings: PHQ2-9   Flowsheet Row Office Visit from 06/14/2020 in [facility] Video Visit from 04/06/2020 in [facility] Office Visit from 11/28/2017 in [facility] Office Visit from 09/25/2017 in [facility] Office Visit from 03/28/2017 in [facility]  PHQ-2 Total Score 6 5 0 0 0  PHQ-9 Total Score 6 9 -- -- --    Flowsheet Row ED to Hosp-Admission (Discharged) from 04/18/2020 in [facility] Video Visit from 04/06/2020 in [facility]   C-SSRS RISK CATEGORY No Risk No Risk       Assessment and Plan:  TEYLA HELBIG is a 74 y.o. year old female with a history of  schizophrenia, depression,SVT, DVT, history of stroke on warfarin, type II diabetes, hypertension, GERD, who presents for follow up appointment for below.   1. Schizophrenia, unspecified type (HCC) 2. Mild episode of recurrent major depressive disorder (HCC) Although she continues to report depressed mood and anxiety, it has been relatively stable since the last visit.  Will continue Risperdal to target schizophrenia.  Will continue Abilify to target schizophrenia and as adjunctive treatment for depression.  Discussed potential metabolic side effect and EPS.  Noted that although it is preferable to do monotherapy especially given her diabetes, she had worsening in withdrawal symptoms/aggression in the context of tapering off Risperdal in the past.   Plan 1. ContinueAbilify2 mg daily 2.ContinueRisperidone 1 mgtwice a day 3.Next appointment:8/19 at 10 AM, virtual visit - Fax AVS to [phone] - onclonazepam 0.5 mg three timesa day for anxiety (prescribed by Dr. Gerilyn Pilgrim) - on mirtazapine 30 mg at night, prescribed by Dr. [name] - onduloxetine 120 mg daily(prescribed byDr. [name]  - onZolpidem 10 mg at night-prescribed by Dr. [name] -on gabapentin 300 mg TID   The patient demonstrates the following risk factors for suicide: Chronic  risk factors for suicide include:psychiatric disorder ofschizophrenia by history, depression. Acute risk factorsfor suicide include: unemployment. Protective factorsfor this patient include: positive social support and hope for the future. Considering these factors, the overall suicide risk at this point appears to below. Patientisappropriate for outpatient follow up.  [name], MD 06/14/2020, 11:33 AM

## 2020-06-14 ENCOUNTER — Ambulatory Visit (INDEPENDENT_AMBULATORY_CARE_PROVIDER_SITE_OTHER): Payer: Medicare Other | Admitting: Psychiatry

## 2020-06-14 ENCOUNTER — Other Ambulatory Visit: Payer: Self-pay

## 2020-06-14 ENCOUNTER — Encounter: Payer: Self-pay | Admitting: Psychiatry

## 2020-06-14 VITALS — BP 88/51 | HR 67 | Temp 97.8°F

## 2020-06-14 DIAGNOSIS — F209 Schizophrenia, unspecified: Secondary | ICD-10-CM

## 2020-06-14 DIAGNOSIS — I951 Orthostatic hypotension: Secondary | ICD-10-CM | POA: Diagnosis not present

## 2020-06-14 DIAGNOSIS — F330 Major depressive disorder, recurrent, mild: Secondary | ICD-10-CM | POA: Diagnosis not present

## 2020-06-14 DIAGNOSIS — R6889 Other general symptoms and signs: Secondary | ICD-10-CM | POA: Diagnosis not present

## 2020-06-14 DIAGNOSIS — I69354 Hemiplegia and hemiparesis following cerebral infarction affecting left non-dominant side: Secondary | ICD-10-CM | POA: Diagnosis not present

## 2020-06-14 DIAGNOSIS — E1142 Type 2 diabetes mellitus with diabetic polyneuropathy: Secondary | ICD-10-CM | POA: Diagnosis not present

## 2020-06-14 DIAGNOSIS — I4891 Unspecified atrial fibrillation: Secondary | ICD-10-CM | POA: Diagnosis not present

## 2020-06-15 DIAGNOSIS — Z20828 Contact with and (suspected) exposure to other viral communicable diseases: Secondary | ICD-10-CM | POA: Diagnosis not present

## 2020-06-15 DIAGNOSIS — U071 COVID-19: Secondary | ICD-10-CM | POA: Diagnosis not present

## 2020-06-17 DIAGNOSIS — R6889 Other general symptoms and signs: Secondary | ICD-10-CM | POA: Diagnosis not present

## 2020-06-17 DIAGNOSIS — F209 Schizophrenia, unspecified: Secondary | ICD-10-CM | POA: Diagnosis not present

## 2020-06-17 DIAGNOSIS — I951 Orthostatic hypotension: Secondary | ICD-10-CM | POA: Diagnosis not present

## 2020-06-17 DIAGNOSIS — I69354 Hemiplegia and hemiparesis following cerebral infarction affecting left non-dominant side: Secondary | ICD-10-CM | POA: Diagnosis not present

## 2020-06-17 DIAGNOSIS — E1142 Type 2 diabetes mellitus with diabetic polyneuropathy: Secondary | ICD-10-CM | POA: Diagnosis not present

## 2020-06-17 DIAGNOSIS — I4891 Unspecified atrial fibrillation: Secondary | ICD-10-CM | POA: Diagnosis not present

## 2020-06-22 DIAGNOSIS — I951 Orthostatic hypotension: Secondary | ICD-10-CM | POA: Diagnosis not present

## 2020-06-22 DIAGNOSIS — E1142 Type 2 diabetes mellitus with diabetic polyneuropathy: Secondary | ICD-10-CM | POA: Diagnosis not present

## 2020-06-22 DIAGNOSIS — R6889 Other general symptoms and signs: Secondary | ICD-10-CM | POA: Diagnosis not present

## 2020-06-22 DIAGNOSIS — I69354 Hemiplegia and hemiparesis following cerebral infarction affecting left non-dominant side: Secondary | ICD-10-CM | POA: Diagnosis not present

## 2020-06-22 DIAGNOSIS — I4891 Unspecified atrial fibrillation: Secondary | ICD-10-CM | POA: Diagnosis not present

## 2020-06-22 DIAGNOSIS — F209 Schizophrenia, unspecified: Secondary | ICD-10-CM | POA: Diagnosis not present

## 2020-06-23 DIAGNOSIS — Z20828 Contact with and (suspected) exposure to other viral communicable diseases: Secondary | ICD-10-CM | POA: Diagnosis not present

## 2020-06-23 DIAGNOSIS — U071 COVID-19: Secondary | ICD-10-CM | POA: Diagnosis not present

## 2020-06-24 DIAGNOSIS — R6889 Other general symptoms and signs: Secondary | ICD-10-CM | POA: Diagnosis not present

## 2020-06-24 DIAGNOSIS — E1142 Type 2 diabetes mellitus with diabetic polyneuropathy: Secondary | ICD-10-CM | POA: Diagnosis not present

## 2020-06-24 DIAGNOSIS — I4891 Unspecified atrial fibrillation: Secondary | ICD-10-CM | POA: Diagnosis not present

## 2020-06-24 DIAGNOSIS — F209 Schizophrenia, unspecified: Secondary | ICD-10-CM | POA: Diagnosis not present

## 2020-06-24 DIAGNOSIS — I951 Orthostatic hypotension: Secondary | ICD-10-CM | POA: Diagnosis not present

## 2020-06-24 DIAGNOSIS — I69354 Hemiplegia and hemiparesis following cerebral infarction affecting left non-dominant side: Secondary | ICD-10-CM | POA: Diagnosis not present

## 2020-06-29 ENCOUNTER — Other Ambulatory Visit: Payer: Self-pay | Admitting: Nurse Practitioner

## 2020-06-29 DIAGNOSIS — Z20828 Contact with and (suspected) exposure to other viral communicable diseases: Secondary | ICD-10-CM | POA: Diagnosis not present

## 2020-06-29 DIAGNOSIS — M79674 Pain in right toe(s): Secondary | ICD-10-CM | POA: Diagnosis not present

## 2020-06-29 DIAGNOSIS — M79675 Pain in left toe(s): Secondary | ICD-10-CM | POA: Diagnosis not present

## 2020-06-29 DIAGNOSIS — U071 COVID-19: Secondary | ICD-10-CM | POA: Diagnosis not present

## 2020-06-29 DIAGNOSIS — B351 Tinea unguium: Secondary | ICD-10-CM | POA: Diagnosis not present

## 2020-07-01 DIAGNOSIS — E1142 Type 2 diabetes mellitus with diabetic polyneuropathy: Secondary | ICD-10-CM | POA: Diagnosis not present

## 2020-07-01 DIAGNOSIS — I4891 Unspecified atrial fibrillation: Secondary | ICD-10-CM | POA: Diagnosis not present

## 2020-07-01 DIAGNOSIS — R6889 Other general symptoms and signs: Secondary | ICD-10-CM | POA: Diagnosis not present

## 2020-07-01 DIAGNOSIS — I69354 Hemiplegia and hemiparesis following cerebral infarction affecting left non-dominant side: Secondary | ICD-10-CM | POA: Diagnosis not present

## 2020-07-01 DIAGNOSIS — I10 Essential (primary) hypertension: Secondary | ICD-10-CM | POA: Diagnosis not present

## 2020-07-01 DIAGNOSIS — Z794 Long term (current) use of insulin: Secondary | ICD-10-CM | POA: Diagnosis not present

## 2020-07-01 DIAGNOSIS — I951 Orthostatic hypotension: Secondary | ICD-10-CM | POA: Diagnosis not present

## 2020-07-01 DIAGNOSIS — F209 Schizophrenia, unspecified: Secondary | ICD-10-CM | POA: Diagnosis not present

## 2020-07-05 DIAGNOSIS — R6889 Other general symptoms and signs: Secondary | ICD-10-CM | POA: Diagnosis not present

## 2020-07-05 DIAGNOSIS — F209 Schizophrenia, unspecified: Secondary | ICD-10-CM | POA: Diagnosis not present

## 2020-07-05 DIAGNOSIS — I4891 Unspecified atrial fibrillation: Secondary | ICD-10-CM | POA: Diagnosis not present

## 2020-07-05 DIAGNOSIS — I951 Orthostatic hypotension: Secondary | ICD-10-CM | POA: Diagnosis not present

## 2020-07-05 DIAGNOSIS — E1142 Type 2 diabetes mellitus with diabetic polyneuropathy: Secondary | ICD-10-CM | POA: Diagnosis not present

## 2020-07-05 DIAGNOSIS — I69354 Hemiplegia and hemiparesis following cerebral infarction affecting left non-dominant side: Secondary | ICD-10-CM | POA: Diagnosis not present

## 2020-07-06 DIAGNOSIS — I69354 Hemiplegia and hemiparesis following cerebral infarction affecting left non-dominant side: Secondary | ICD-10-CM | POA: Diagnosis not present

## 2020-07-06 DIAGNOSIS — I4891 Unspecified atrial fibrillation: Secondary | ICD-10-CM | POA: Diagnosis not present

## 2020-07-06 DIAGNOSIS — E1142 Type 2 diabetes mellitus with diabetic polyneuropathy: Secondary | ICD-10-CM | POA: Diagnosis not present

## 2020-07-06 DIAGNOSIS — F209 Schizophrenia, unspecified: Secondary | ICD-10-CM | POA: Diagnosis not present

## 2020-07-06 DIAGNOSIS — I951 Orthostatic hypotension: Secondary | ICD-10-CM | POA: Diagnosis not present

## 2020-07-06 DIAGNOSIS — U071 COVID-19: Secondary | ICD-10-CM | POA: Diagnosis not present

## 2020-07-06 DIAGNOSIS — Z20828 Contact with and (suspected) exposure to other viral communicable diseases: Secondary | ICD-10-CM | POA: Diagnosis not present

## 2020-07-06 DIAGNOSIS — R6889 Other general symptoms and signs: Secondary | ICD-10-CM | POA: Diagnosis not present

## 2020-07-08 ENCOUNTER — Other Ambulatory Visit: Payer: Self-pay | Admitting: Nurse Practitioner

## 2020-07-11 DIAGNOSIS — I951 Orthostatic hypotension: Secondary | ICD-10-CM | POA: Diagnosis not present

## 2020-07-11 DIAGNOSIS — I4891 Unspecified atrial fibrillation: Secondary | ICD-10-CM | POA: Diagnosis not present

## 2020-07-11 DIAGNOSIS — F209 Schizophrenia, unspecified: Secondary | ICD-10-CM | POA: Diagnosis not present

## 2020-07-11 DIAGNOSIS — E1142 Type 2 diabetes mellitus with diabetic polyneuropathy: Secondary | ICD-10-CM | POA: Diagnosis not present

## 2020-07-11 DIAGNOSIS — I69354 Hemiplegia and hemiparesis following cerebral infarction affecting left non-dominant side: Secondary | ICD-10-CM | POA: Diagnosis not present

## 2020-07-11 DIAGNOSIS — R6889 Other general symptoms and signs: Secondary | ICD-10-CM | POA: Diagnosis not present

## 2020-07-13 DIAGNOSIS — Z20828 Contact with and (suspected) exposure to other viral communicable diseases: Secondary | ICD-10-CM | POA: Diagnosis not present

## 2020-07-13 DIAGNOSIS — U071 COVID-19: Secondary | ICD-10-CM | POA: Diagnosis not present

## 2020-07-14 ENCOUNTER — Encounter: Payer: Self-pay | Admitting: Internal Medicine

## 2020-07-14 ENCOUNTER — Other Ambulatory Visit: Payer: Self-pay

## 2020-07-14 ENCOUNTER — Ambulatory Visit (INDEPENDENT_AMBULATORY_CARE_PROVIDER_SITE_OTHER): Payer: Medicare Other | Admitting: Internal Medicine

## 2020-07-14 VITALS — BP 107/51 | HR 74 | Temp 97.0°F | Ht 64.0 in | Wt 119.0 lb

## 2020-07-14 DIAGNOSIS — I4891 Unspecified atrial fibrillation: Secondary | ICD-10-CM | POA: Diagnosis not present

## 2020-07-14 DIAGNOSIS — E1142 Type 2 diabetes mellitus with diabetic polyneuropathy: Secondary | ICD-10-CM | POA: Diagnosis not present

## 2020-07-14 DIAGNOSIS — I951 Orthostatic hypotension: Secondary | ICD-10-CM | POA: Diagnosis not present

## 2020-07-14 DIAGNOSIS — R6889 Other general symptoms and signs: Secondary | ICD-10-CM | POA: Diagnosis not present

## 2020-07-14 DIAGNOSIS — K219 Gastro-esophageal reflux disease without esophagitis: Secondary | ICD-10-CM | POA: Diagnosis not present

## 2020-07-14 DIAGNOSIS — K582 Mixed irritable bowel syndrome: Secondary | ICD-10-CM

## 2020-07-14 DIAGNOSIS — R634 Abnormal weight loss: Secondary | ICD-10-CM

## 2020-07-14 DIAGNOSIS — I69354 Hemiplegia and hemiparesis following cerebral infarction affecting left non-dominant side: Secondary | ICD-10-CM | POA: Diagnosis not present

## 2020-07-14 DIAGNOSIS — F209 Schizophrenia, unspecified: Secondary | ICD-10-CM | POA: Diagnosis not present

## 2020-07-14 NOTE — Progress Notes (Signed)
Referring Provider: [name], MD Primary Care Physician:  [name], MD Primary GI:  Dr. Abbey Chatters  Chief Complaint  Patient presents with   Gastroesophageal Reflux    HPI:   [name] is a 75 y.o. female who presents to clinic today for follow-up visit.  She has chronic GERD with breakthrough symptoms on Protonix 40 mg daily.  This was increased to 40 mg twice daily on previous visit and she states her symptoms are improved.  Does note some intermittent dysphagia though no regurgitation.  She also has chronic constipation which is controlled on MiraLAX.  She states she will have 2-3 bowel movements a week while taking this which are normal.  Did have a hospitalization in March 2022 for partial small bowel obstruction as well as obstipation.  She was treated conservatively and recovered well.  Weight loss continues to be an issue.  She weighs 119 in office today down from 122 on previous visit.  Previous visit she had also lost 3 pounds from the visit prior to that.  She notes eating mainly cereal at the center.  Past Medical History:  Diagnosis Date   Anxiety    Arthritis    Atrial fibrillation ([place])    Collagen vascular disease ([place])    COVID-19 virus infection    COVID-19+ approx 01/24/19; asymptomatic course with full recovery   Dependence on wheelchair    pivot/transfers   Depression    History of psychosis and previous suicide attempt   DVT, lower extremity, recurrent ([place])    Long-term Coumadin per Dr. Legrand Rams   Essential hypertension    GERD (gastroesophageal reflux disease)    Hemiplegia ([place]) 2010   Left side   History of stroke    Acute infarct and right cerebral white matter small vessel disease 12/10   Leg DVT (deep venous thromboembolism), acute ([place]) 2006   Schizophrenia ([place])    Stroke ([place])    left sided weakness   Type 2 diabetes mellitus ([place])     Past Surgical History:  Procedure Laterality Date   BACK SURGERY     BIOPSY N/A 11/24/2014    Procedure: BIOPSY;  Surgeon: Danie Binder, MD;  Location: [facility];  Service: Endoscopy;  Laterality: N/A;   BIOPSY  05/17/2016   Procedure: BIOPSY;  Surgeon: Danie Binder, MD;  Location: [facility];  Service: Endoscopy;;  gastric biopsy   COLONOSCOPY WITH PROPOFOL N/A 11/24/2014   Dr. Rudie Meyer polyps removed/moderate sized internal hemorrhoids, tubular adenomas. Next surveillance in 3 years   ESOPHAGOGASTRODUODENOSCOPY (EGD) WITH PROPOFOL N/A 11/24/2014   Dr. Clayburn Pert HH/patent stricture at the gastroesophageal junction, mild non-erosive gastritis, path negative for H.pylori or celiac sprue   ESOPHAGOGASTRODUODENOSCOPY (EGD) WITH PROPOFOL N/A 05/17/2016   Procedure: ESOPHAGOGASTRODUODENOSCOPY (EGD) WITH PROPOFOL;  Surgeon: Danie Binder, MD;  Location: [facility];  Service: Endoscopy;  Laterality: N/A;  12:45pm   GIVENS CAPSULE STUDY N/A 12/11/2014   MULTILPLE EROSION IN the stomach WITH ACTIVE OOZING. OCCASIONAL EROSIONS AND RARE ULCER SEEN IN PROXIMAL SMALL BOWEL . No masses or AVMs SEEN. NO OLD BLOOD OR FRESH BLOOD SEEN.    POLYPECTOMY N/A 11/24/2014   Procedure: POLYPECTOMY;  Surgeon: Danie Binder, MD;  Location: [facility];  Service: Endoscopy;  Laterality: N/A;   SAVORY DILATION N/A 05/17/2016   Procedure: SAVORY DILATION;  Surgeon: Danie Binder, MD;  Location: [facility];  Service: Endoscopy;  Laterality: N/A;    Current Outpatient Medications  Medication  Sig Dispense Refill   ABILIFY 2 MG tablet Take 2 mg by mouth daily.     acetaminophen (TYLENOL) 325 MG tablet Take 650 mg by mouth 3 (three) times daily as needed for mild pain or moderate pain.      apixaban (ELIQUIS) 5 MG TABS tablet Take 1 tablet (5 mg total) by mouth 2 (two) times daily. 60 tablet 0   atorvastatin (LIPITOR) 40 MG tablet Take 1 tablet by mouth daily as needed.     calcium carbonate (TUMS - DOSED IN MG ELEMENTAL CALCIUM) 500 MG chewable tablet Chew 1 tablet by mouth 4 (four) times daily as needed for  heartburn.     Carboxymethylcellulose Sodium (ARTIFICIAL TEARS OP) Place 1 drop into both eyes 3 (three) times daily.     Cholecalciferol (VITAMIN D3) 50 MCG (2000 UT) TABS Take 2,000 Units by mouth daily.     clonazePAM (KLONOPIN) 0.5 MG tablet Take 1 tablet (0.5 mg total) by mouth 3 (three) times daily as needed for anxiety. 12 tablet 0   diltiazem (DILACOR XR) 120 MG 24 hr capsule Take 120 mg by mouth daily.     DULoxetine (CYMBALTA) 60 MG capsule Take 60 mg by mouth daily.     EASYMAX TEST test strip USE TO CHECK BLOOD SUGAR TWICE DAILY. 100 strip 0   gabapentin (NEURONTIN) 300 MG capsule Take 1 capsule (300 mg total) by mouth 3 (three) times daily as needed.     GOODSENSE ARTIFICIAL TEARS 0.5-0.6 % SOLN Apply to eye.     HYDROcodone-acetaminophen (NORCO) 10-325 MG tablet Take 1 tablet by mouth 3 (three) times daily as needed for severe pain. 12 tablet 0   hydrocortisone (ANUSOL-HC) 2.5 % rectal cream Place 1 application rectally 2 (two) times daily as needed (for rectal bleeding).     hydrOXYzine (ATARAX/VISTARIL) 25 MG tablet Take 1 tablet (25 mg total) by mouth every 8 (eight) hours as needed for anxiety or itching. 30 tablet 0   insulin detemir (LEVEMIR FLEXTOUCH) 100 UNIT/ML FlexPen Inject 45 Units into the skin at bedtime. 15 mL 3   Lancets 28G MISC USE TO CHECK BLOOD SUGAR TWICE DAILY. 100 each 5   latanoprost (XALATAN) 0.005 % ophthalmic solution Place 1 drop into both eyes at bedtime.     loratadine (CLARITIN) 10 MG tablet Take 10 mg by mouth daily.     lubiprostone (AMITIZA) 24 MCG capsule Take 1 capsule (24 mcg total) by mouth 2 (two) times daily with a meal. 60 capsule 5   metoprolol succinate (TOPROL-XL) 25 MG 24 hr tablet Take 3 tablets (75 mg total) by mouth 2 (two) times daily. 90 tablet 3   midodrine (PROAMATINE) 2.5 MG tablet Take 3 tablets (7.5 mg total) by mouth 3 (three) times daily. (Patient taking differently: Take 10 mg by mouth 3 (three) times daily. Takes 1 tablet by  mouth 3times daily)     mirabegron ER (MYRBETRIQ) 25 MG TB24 tablet Take 25 mg by mouth daily.     mirtazapine (REMERON) 30 MG tablet Take 30 mg by mouth at bedtime.     ondansetron (ZOFRAN) 4 MG tablet Take 1 tablet (4 mg total) by mouth 2 (two) times daily as needed for nausea or vomiting.     pantoprazole (PROTONIX) 40 MG tablet Take 1 tablet (40 mg total) by mouth daily. 270 tablet 3   polyethylene glycol (MIRALAX / GLYCOLAX) 17 g packet Take 17 g by mouth daily. 14 each 0   risperiDONE (RISPERDAL) 1  MG tablet Take 1 tablet (1 mg total) by mouth in the morning and at bedtime. 60 tablet 4   senna (SENOKOT) 8.6 MG TABS tablet Take 1 tablet (8.6 mg total) by mouth daily. 120 tablet 0   TRADJENTA 5 MG TABS tablet TAKE (1) TABLET BY MOUTH ONCE DAILY. 30 tablet 3   zolpidem (AMBIEN) 5 MG tablet Take 5 mg by mouth at bedtime as needed.     diltiazem (CARDIZEM CD) 120 MG 24 hr capsule Take 120 mg by mouth daily. (Patient not taking: Reported on 07/14/2020)     simvastatin (ZOCOR) 20 MG tablet Take 20 mg by mouth daily at 6 PM. (Patient not taking: Reported on 07/14/2020)     No current facility-administered medications for this visit.    Allergies as of 07/14/2020 - Review Complete 06/14/2020  Allergen Reaction Noted   Sulfa antibiotics Rash 02/23/2010    Family History  Problem Relation Age of Onset   Hypertension Mother    Colon cancer Neg Hx     Social History   Socioeconomic History   Marital status: Widowed    Spouse name: Not on file   Number of children: Not on file   Years of education: Not on file   Highest education level: Not on file  Occupational History   Not on file  Tobacco Use   Smoking status: Former    Packs/day: 0.25    Years: 20.00    Pack years: 5.00    Types: Cigarettes    Quit date: 01/24/1995    Years since quitting: 25.4   Smokeless tobacco: Never  Vaping Use   Vaping Use: Never used  Substance and Sexual Activity   Alcohol use: No    Alcohol/week:  0.0 standard drinks   Drug use: No   Sexual activity: Never    Birth control/protection: Post-menopausal  Other Topics Concern   Not on file  Social History Narrative   Not on file   Social Determinants of Health   Financial Resource Strain: Not on file  Food Insecurity: Not on file  Transportation Needs: Not on file  Physical Activity: Not on file  Stress: Not on file  Social Connections: Not on file    Subjective: Review of Systems  Constitutional:  Negative for chills and fever.  HENT:  Negative for congestion and hearing loss.   Eyes:  Negative for blurred vision and double vision.  Respiratory:  Negative for cough and shortness of breath.   Cardiovascular:  Negative for chest pain and palpitations.  Gastrointestinal:  Positive for constipation and heartburn. Negative for abdominal pain, blood in stool, diarrhea, melena and vomiting.  Genitourinary:  Negative for dysuria and urgency.  Musculoskeletal:  Negative for joint pain and myalgias.  Skin:  Negative for itching and rash.  Neurological:  Negative for dizziness and headaches.  Psychiatric/Behavioral:  Negative for depression. The patient is not nervous/anxious.     Objective: BP (!) 107/51   Pulse 74   Temp (!) 97 F (36.1 C) (Temporal)   Ht 5\' 4"  (1.626 m)   Wt 119 lb (54 kg) Comment: pt stated  BMI 20.43 kg/m  Physical Exam Constitutional:      Appearance: Normal appearance.     Comments: Wheelchair bound  HENT:     Head: Normocephalic and atraumatic.  Eyes:     Extraocular Movements: Extraocular movements intact.     Conjunctiva/sclera: Conjunctivae normal.  Cardiovascular:     Rate and Rhythm: Normal rate. Rhythm irregular.  Comments: 3/6 systolic murmur Pulmonary:     Effort: Pulmonary effort is normal.     Breath sounds: Normal breath sounds.  Abdominal:     General: Bowel sounds are normal.     Palpations: Abdomen is soft.  Musculoskeletal:        General: No swelling. Normal range of  motion.     Cervical back: Normal range of motion and neck supple.  Skin:    General: Skin is warm and dry.     Coloration: Skin is not jaundiced.  Neurological:     General: No focal deficit present.     Mental Status: She is alert and oriented to person, place, and time.  Psychiatric:        Mood and Affect: Mood normal.        Behavior: Behavior normal.     Assessment: *Chronic GERD-well-controlled on pantoprazole 40 mg twice daily *Constipation-well-controlled on MiraLAX *Weight loss-ongoing  Plan: Patient's GERD well-controlled on pantoprazole 40 mg daily.  Will continue.  Some intermittent dysphagia though no epigastric or chest pain.  Constipation well controlled on MiraLAX.  We will continue.  She states she is currently having 2-3 bowel movements a week.  She can increase to up to 2 capfuls daily as needed.  Weight loss ongoing.  Has lost another 3 pounds since previous visit.  Sounds like she eats a lot of cereal.  I have recommended that she implement at least 1 protein shake daily at the center.  States she is willing to do this.  Follow-up in 4 months.  07/14/2020 10:40 AM   Disclaimer: This note was dictated with voice recognition software. Similar sounding words can inadvertently be transcribed and may not be corrected upon review.

## 2020-07-16 ENCOUNTER — Telehealth: Payer: Self-pay

## 2020-07-16 NOTE — Telephone Encounter (Signed)
[facility] here in [place] faxed a review and sign sheet for Dr. Ave Filter orders of adding 1 protein shake daily. Was signed and faxed back

## 2020-07-19 DIAGNOSIS — I4891 Unspecified atrial fibrillation: Secondary | ICD-10-CM | POA: Diagnosis not present

## 2020-07-19 DIAGNOSIS — F209 Schizophrenia, unspecified: Secondary | ICD-10-CM | POA: Diagnosis not present

## 2020-07-19 DIAGNOSIS — I951 Orthostatic hypotension: Secondary | ICD-10-CM | POA: Diagnosis not present

## 2020-07-19 DIAGNOSIS — I69354 Hemiplegia and hemiparesis following cerebral infarction affecting left non-dominant side: Secondary | ICD-10-CM | POA: Diagnosis not present

## 2020-07-19 DIAGNOSIS — R6889 Other general symptoms and signs: Secondary | ICD-10-CM | POA: Diagnosis not present

## 2020-07-19 DIAGNOSIS — E1142 Type 2 diabetes mellitus with diabetic polyneuropathy: Secondary | ICD-10-CM | POA: Diagnosis not present

## 2020-07-20 DIAGNOSIS — G894 Chronic pain syndrome: Secondary | ICD-10-CM | POA: Diagnosis not present

## 2020-07-20 DIAGNOSIS — Z20828 Contact with and (suspected) exposure to other viral communicable diseases: Secondary | ICD-10-CM | POA: Diagnosis not present

## 2020-07-20 DIAGNOSIS — F419 Anxiety disorder, unspecified: Secondary | ICD-10-CM | POA: Diagnosis not present

## 2020-07-20 DIAGNOSIS — G2401 Drug induced subacute dyskinesia: Secondary | ICD-10-CM | POA: Diagnosis not present

## 2020-07-20 DIAGNOSIS — U071 COVID-19: Secondary | ICD-10-CM | POA: Diagnosis not present

## 2020-07-20 DIAGNOSIS — M5000 Cervical disc disorder with myelopathy, unspecified cervical region: Secondary | ICD-10-CM | POA: Diagnosis not present

## 2020-07-20 DIAGNOSIS — Z79891 Long term (current) use of opiate analgesic: Secondary | ICD-10-CM | POA: Diagnosis not present

## 2020-07-20 DIAGNOSIS — I69359 Hemiplegia and hemiparesis following cerebral infarction affecting unspecified side: Secondary | ICD-10-CM | POA: Diagnosis not present

## 2020-07-20 DIAGNOSIS — M5459 Other low back pain: Secondary | ICD-10-CM | POA: Diagnosis not present

## 2020-07-21 DIAGNOSIS — I951 Orthostatic hypotension: Secondary | ICD-10-CM | POA: Diagnosis not present

## 2020-07-21 DIAGNOSIS — I69354 Hemiplegia and hemiparesis following cerebral infarction affecting left non-dominant side: Secondary | ICD-10-CM | POA: Diagnosis not present

## 2020-07-21 DIAGNOSIS — R6889 Other general symptoms and signs: Secondary | ICD-10-CM | POA: Diagnosis not present

## 2020-07-21 DIAGNOSIS — F209 Schizophrenia, unspecified: Secondary | ICD-10-CM | POA: Diagnosis not present

## 2020-07-21 DIAGNOSIS — I4891 Unspecified atrial fibrillation: Secondary | ICD-10-CM | POA: Diagnosis not present

## 2020-07-21 DIAGNOSIS — E1142 Type 2 diabetes mellitus with diabetic polyneuropathy: Secondary | ICD-10-CM | POA: Diagnosis not present

## 2020-07-23 DIAGNOSIS — G8190 Hemiplegia, unspecified affecting unspecified side: Secondary | ICD-10-CM | POA: Diagnosis not present

## 2020-07-23 DIAGNOSIS — E1142 Type 2 diabetes mellitus with diabetic polyneuropathy: Secondary | ICD-10-CM | POA: Diagnosis not present

## 2020-07-23 DIAGNOSIS — M5489 Other dorsalgia: Secondary | ICD-10-CM | POA: Diagnosis not present

## 2020-07-23 DIAGNOSIS — I10 Essential (primary) hypertension: Secondary | ICD-10-CM | POA: Diagnosis not present

## 2020-07-27 DIAGNOSIS — I69354 Hemiplegia and hemiparesis following cerebral infarction affecting left non-dominant side: Secondary | ICD-10-CM | POA: Diagnosis not present

## 2020-07-27 DIAGNOSIS — R6889 Other general symptoms and signs: Secondary | ICD-10-CM | POA: Diagnosis not present

## 2020-07-27 DIAGNOSIS — I4891 Unspecified atrial fibrillation: Secondary | ICD-10-CM | POA: Diagnosis not present

## 2020-07-27 DIAGNOSIS — U071 COVID-19: Secondary | ICD-10-CM | POA: Diagnosis not present

## 2020-07-27 DIAGNOSIS — F209 Schizophrenia, unspecified: Secondary | ICD-10-CM | POA: Diagnosis not present

## 2020-07-27 DIAGNOSIS — I951 Orthostatic hypotension: Secondary | ICD-10-CM | POA: Diagnosis not present

## 2020-07-27 DIAGNOSIS — E1142 Type 2 diabetes mellitus with diabetic polyneuropathy: Secondary | ICD-10-CM | POA: Diagnosis not present

## 2020-07-27 DIAGNOSIS — Z20828 Contact with and (suspected) exposure to other viral communicable diseases: Secondary | ICD-10-CM | POA: Diagnosis not present

## 2020-07-29 DIAGNOSIS — E1142 Type 2 diabetes mellitus with diabetic polyneuropathy: Secondary | ICD-10-CM | POA: Diagnosis not present

## 2020-07-29 DIAGNOSIS — R6889 Other general symptoms and signs: Secondary | ICD-10-CM | POA: Diagnosis not present

## 2020-07-29 DIAGNOSIS — F209 Schizophrenia, unspecified: Secondary | ICD-10-CM | POA: Diagnosis not present

## 2020-07-29 DIAGNOSIS — I69354 Hemiplegia and hemiparesis following cerebral infarction affecting left non-dominant side: Secondary | ICD-10-CM | POA: Diagnosis not present

## 2020-07-29 DIAGNOSIS — I951 Orthostatic hypotension: Secondary | ICD-10-CM | POA: Diagnosis not present

## 2020-07-29 DIAGNOSIS — I4891 Unspecified atrial fibrillation: Secondary | ICD-10-CM | POA: Diagnosis not present

## 2020-07-30 ENCOUNTER — Other Ambulatory Visit: Payer: Self-pay | Admitting: Nurse Practitioner

## 2020-08-03 DIAGNOSIS — U071 COVID-19: Secondary | ICD-10-CM | POA: Diagnosis not present

## 2020-08-03 DIAGNOSIS — Z20828 Contact with and (suspected) exposure to other viral communicable diseases: Secondary | ICD-10-CM | POA: Diagnosis not present

## 2020-08-06 DIAGNOSIS — R278 Other lack of coordination: Secondary | ICD-10-CM | POA: Diagnosis not present

## 2020-08-06 DIAGNOSIS — M6281 Muscle weakness (generalized): Secondary | ICD-10-CM | POA: Diagnosis not present

## 2020-08-10 DIAGNOSIS — R278 Other lack of coordination: Secondary | ICD-10-CM | POA: Diagnosis not present

## 2020-08-10 DIAGNOSIS — M6281 Muscle weakness (generalized): Secondary | ICD-10-CM | POA: Diagnosis not present

## 2020-08-10 DIAGNOSIS — U071 COVID-19: Secondary | ICD-10-CM | POA: Diagnosis not present

## 2020-08-10 DIAGNOSIS — Z20828 Contact with and (suspected) exposure to other viral communicable diseases: Secondary | ICD-10-CM | POA: Diagnosis not present

## 2020-08-11 DIAGNOSIS — R278 Other lack of coordination: Secondary | ICD-10-CM | POA: Diagnosis not present

## 2020-08-11 DIAGNOSIS — E119 Type 2 diabetes mellitus without complications: Secondary | ICD-10-CM | POA: Diagnosis not present

## 2020-08-11 DIAGNOSIS — M6281 Muscle weakness (generalized): Secondary | ICD-10-CM | POA: Diagnosis not present

## 2020-08-11 DIAGNOSIS — Z794 Long term (current) use of insulin: Secondary | ICD-10-CM | POA: Diagnosis not present

## 2020-08-11 DIAGNOSIS — H25813 Combined forms of age-related cataract, bilateral: Secondary | ICD-10-CM | POA: Diagnosis not present

## 2020-08-11 DIAGNOSIS — Z7984 Long term (current) use of oral hypoglycemic drugs: Secondary | ICD-10-CM | POA: Diagnosis not present

## 2020-08-12 DIAGNOSIS — E1140 Type 2 diabetes mellitus with diabetic neuropathy, unspecified: Secondary | ICD-10-CM | POA: Diagnosis not present

## 2020-08-12 DIAGNOSIS — M79674 Pain in right toe(s): Secondary | ICD-10-CM | POA: Diagnosis not present

## 2020-08-12 DIAGNOSIS — I739 Peripheral vascular disease, unspecified: Secondary | ICD-10-CM | POA: Diagnosis not present

## 2020-08-12 DIAGNOSIS — M79675 Pain in left toe(s): Secondary | ICD-10-CM | POA: Diagnosis not present

## 2020-08-12 DIAGNOSIS — M79671 Pain in right foot: Secondary | ICD-10-CM | POA: Diagnosis not present

## 2020-08-12 DIAGNOSIS — M79672 Pain in left foot: Secondary | ICD-10-CM | POA: Diagnosis not present

## 2020-08-15 DIAGNOSIS — R278 Other lack of coordination: Secondary | ICD-10-CM | POA: Diagnosis not present

## 2020-08-15 DIAGNOSIS — M6281 Muscle weakness (generalized): Secondary | ICD-10-CM | POA: Diagnosis not present

## 2020-08-17 DIAGNOSIS — Z20828 Contact with and (suspected) exposure to other viral communicable diseases: Secondary | ICD-10-CM | POA: Diagnosis not present

## 2020-08-17 DIAGNOSIS — U071 COVID-19: Secondary | ICD-10-CM | POA: Diagnosis not present

## 2020-08-17 DIAGNOSIS — M6281 Muscle weakness (generalized): Secondary | ICD-10-CM | POA: Diagnosis not present

## 2020-08-17 DIAGNOSIS — R278 Other lack of coordination: Secondary | ICD-10-CM | POA: Diagnosis not present

## 2020-08-20 ENCOUNTER — Other Ambulatory Visit: Payer: Self-pay | Admitting: "Endocrinology

## 2020-08-23 DIAGNOSIS — I10 Essential (primary) hypertension: Secondary | ICD-10-CM | POA: Diagnosis not present

## 2020-08-23 DIAGNOSIS — E1165 Type 2 diabetes mellitus with hyperglycemia: Secondary | ICD-10-CM | POA: Diagnosis not present

## 2020-08-24 DIAGNOSIS — M6281 Muscle weakness (generalized): Secondary | ICD-10-CM | POA: Diagnosis not present

## 2020-08-24 DIAGNOSIS — U071 COVID-19: Secondary | ICD-10-CM | POA: Diagnosis not present

## 2020-08-24 DIAGNOSIS — Z20828 Contact with and (suspected) exposure to other viral communicable diseases: Secondary | ICD-10-CM | POA: Diagnosis not present

## 2020-08-24 DIAGNOSIS — R278 Other lack of coordination: Secondary | ICD-10-CM | POA: Diagnosis not present

## 2020-08-26 DIAGNOSIS — R278 Other lack of coordination: Secondary | ICD-10-CM | POA: Diagnosis not present

## 2020-08-26 DIAGNOSIS — M6281 Muscle weakness (generalized): Secondary | ICD-10-CM | POA: Diagnosis not present

## 2020-08-31 DIAGNOSIS — Z20828 Contact with and (suspected) exposure to other viral communicable diseases: Secondary | ICD-10-CM | POA: Diagnosis not present

## 2020-08-31 DIAGNOSIS — E1159 Type 2 diabetes mellitus with other circulatory complications: Secondary | ICD-10-CM | POA: Diagnosis not present

## 2020-08-31 DIAGNOSIS — U071 COVID-19: Secondary | ICD-10-CM | POA: Diagnosis not present

## 2020-09-01 ENCOUNTER — Other Ambulatory Visit: Payer: Self-pay

## 2020-09-01 ENCOUNTER — Ambulatory Visit (HOSPITAL_COMMUNITY)
Admission: RE | Admit: 2020-09-01 | Discharge: 2020-09-01 | Disposition: A | Discharge status: Home / Self Care | Payer: Medicare Other | Source: Ambulatory Visit | Attending: Nurse Practitioner | Admitting: Nurse Practitioner

## 2020-09-01 DIAGNOSIS — E041 Nontoxic single thyroid nodule: Secondary | ICD-10-CM | POA: Diagnosis not present

## 2020-09-01 DIAGNOSIS — R7989 Other specified abnormal findings of blood chemistry: Secondary | ICD-10-CM | POA: Diagnosis not present

## 2020-09-01 DIAGNOSIS — M6281 Muscle weakness (generalized): Secondary | ICD-10-CM | POA: Diagnosis not present

## 2020-09-01 DIAGNOSIS — E0590 Thyrotoxicosis, unspecified without thyrotoxic crisis or storm: Secondary | ICD-10-CM | POA: Diagnosis not present

## 2020-09-01 DIAGNOSIS — R278 Other lack of coordination: Secondary | ICD-10-CM | POA: Diagnosis not present

## 2020-09-01 LAB — COMPREHENSIVE METABOLIC PANEL
ALT: 9 IU/L (ref 0–32)
AST: 12 IU/L (ref 0–40)
Albumin/Globulin Ratio: 1.6 (ref 1.2–2.2)
Albumin: 4.3 g/dL (ref 3.7–4.7)
Alkaline Phosphatase: 108 IU/L (ref 44–121)
BUN/Creatinine Ratio: 16 (ref 12–28)
BUN: 11 mg/dL (ref 8–27)
Bilirubin Total: 0.4 mg/dL (ref 0.0–1.2)
CO2: 25 mmol/L (ref 20–29)
Calcium: 9.1 mg/dL (ref 8.7–10.3)
Chloride: 95 mmol/L — ABNORMAL LOW (ref 96–106)
Creatinine, Ser: 0.67 mg/dL (ref 0.57–1.00)
Globulin, Total: 2.7 g/dL (ref 1.5–4.5)
Glucose: 331 mg/dL — ABNORMAL HIGH (ref 65–99)
Potassium: 4.9 mmol/L (ref 3.5–5.2)
Sodium: 134 mmol/L (ref 134–144)
Total Protein: 7 g/dL (ref 6.0–8.5)
eGFR: 91 mL/min/{1.73_m2} (ref >59)

## 2020-09-02 DIAGNOSIS — M6281 Muscle weakness (generalized): Secondary | ICD-10-CM | POA: Diagnosis not present

## 2020-09-02 DIAGNOSIS — R278 Other lack of coordination: Secondary | ICD-10-CM | POA: Diagnosis not present

## 2020-09-07 DIAGNOSIS — R278 Other lack of coordination: Secondary | ICD-10-CM | POA: Diagnosis not present

## 2020-09-07 DIAGNOSIS — U071 COVID-19: Secondary | ICD-10-CM | POA: Diagnosis not present

## 2020-09-07 DIAGNOSIS — M6281 Muscle weakness (generalized): Secondary | ICD-10-CM | POA: Diagnosis not present

## 2020-09-07 DIAGNOSIS — Z20828 Contact with and (suspected) exposure to other viral communicable diseases: Secondary | ICD-10-CM | POA: Diagnosis not present

## 2020-09-07 DIAGNOSIS — B351 Tinea unguium: Secondary | ICD-10-CM | POA: Diagnosis not present

## 2020-09-07 DIAGNOSIS — M79674 Pain in right toe(s): Secondary | ICD-10-CM | POA: Diagnosis not present

## 2020-09-07 DIAGNOSIS — M79675 Pain in left toe(s): Secondary | ICD-10-CM | POA: Diagnosis not present

## 2020-09-08 ENCOUNTER — Ambulatory Visit (INDEPENDENT_AMBULATORY_CARE_PROVIDER_SITE_OTHER): Payer: Medicare Other | Admitting: Nurse Practitioner

## 2020-09-08 ENCOUNTER — Other Ambulatory Visit: Payer: Self-pay

## 2020-09-08 ENCOUNTER — Encounter: Payer: Self-pay | Admitting: Nurse Practitioner

## 2020-09-08 ENCOUNTER — Encounter (HOSPITAL_COMMUNITY): Payer: Self-pay | Admitting: Radiology

## 2020-09-08 VITALS — BP 109/61 | HR 73 | Ht 64.0 in

## 2020-09-08 DIAGNOSIS — E1159 Type 2 diabetes mellitus with other circulatory complications: Secondary | ICD-10-CM | POA: Diagnosis not present

## 2020-09-08 DIAGNOSIS — I10 Essential (primary) hypertension: Secondary | ICD-10-CM

## 2020-09-08 DIAGNOSIS — E041 Nontoxic single thyroid nodule: Secondary | ICD-10-CM | POA: Diagnosis not present

## 2020-09-08 DIAGNOSIS — E782 Mixed hyperlipidemia: Secondary | ICD-10-CM

## 2020-09-08 LAB — POCT GLYCOSYLATED HEMOGLOBIN (HGB A1C): Hemoglobin A1C: 10.5 % — AB (ref 4.0–5.6)

## 2020-09-08 MED ORDER — INSULIN LISPRO (1 UNIT DIAL) 100 UNIT/ML (KWIKPEN): 10.0000 [IU] | PEN_INJECTOR | Freq: Three times a day (TID) | SUBCUTANEOUS | 3 refills | Status: DC

## 2020-09-08 MED ORDER — LEVEMIR FLEXTOUCH 100 UNIT/ML ~~LOC~~ SOPN: 55.0000 [IU] | PEN_INJECTOR | Freq: Every day | SUBCUTANEOUS | 3 refills | Status: DC

## 2020-09-08 NOTE — Progress Notes (Signed)
09/08/2020  Endocrinology follow-up note  -She was assisted by her caregiver from nursing home.     Subjective:    Patient ID: [name], female    DOB: 07-13-1945,    Past Medical History:  Diagnosis Date   Anxiety    Arthritis    Atrial fibrillation ([place])    Collagen vascular disease ([place])    COVID-19 virus infection    COVID-19+ approx 01/24/19; asymptomatic course with full recovery   Dependence on wheelchair    pivot/transfers   Depression    History of psychosis and previous suicide attempt   DVT, lower extremity, recurrent ([place])    Long-term Coumadin per Dr. Legrand Rams   Essential hypertension    GERD (gastroesophageal reflux disease)    Hemiplegia ([place]) 2010   Left side   History of stroke    Acute infarct and right cerebral white matter small vessel disease 12/10   Leg DVT (deep venous thromboembolism), acute ([place]) 2006   Schizophrenia ([place])    Stroke ([place])    left sided weakness   Type 2 diabetes mellitus ([place])    Past Surgical History:  Procedure Laterality Date   BACK SURGERY     BIOPSY N/A 11/24/2014   Procedure: BIOPSY;  Surgeon: Danie Binder, MD;  Location: [facility];  Service: Endoscopy;  Laterality: N/A;   BIOPSY  05/17/2016   Procedure: BIOPSY;  Surgeon: Danie Binder, MD;  Location: [facility];  Service: Endoscopy;;  gastric biopsy   COLONOSCOPY WITH PROPOFOL N/A 11/24/2014   Dr. Rudie Meyer polyps removed/moderate sized internal hemorrhoids, tubular adenomas. Next surveillance in 3 years   ESOPHAGOGASTRODUODENOSCOPY (EGD) WITH PROPOFOL N/A 11/24/2014   Dr. Clayburn Pert HH/patent stricture at the gastroesophageal junction, mild non-erosive gastritis, path negative for H.pylori or celiac sprue   ESOPHAGOGASTRODUODENOSCOPY (EGD) WITH PROPOFOL N/A 05/17/2016   Procedure: ESOPHAGOGASTRODUODENOSCOPY (EGD) WITH PROPOFOL;  Surgeon: Danie Binder, MD;  Location[facility];  Service: Endoscopy;  Laterality: N/A;  12:45pm   GIVENS CAPSULE STUDY N/A 12/11/2014    MULTILPLE EROSION IN the stomach WITH ACTIVE OOZING. OCCASIONAL EROSIONS AND RARE ULCER SEEN IN PROXIMAL SMALL BOWEL . No masses or AVMs SEEN. NO OLD BLOOD OR FRESH BLOOD SEEN.    POLYPECTOMY N/A 11/24/2014   Procedure: POLYPECTOMY;  Surgeon: Danie Binder, MD;  Location: [facility];  Service: Endoscopy;  Laterality: N/A;   SAVORY DILATION N/A 05/17/2016   Procedure: SAVORY DILATION;  Surgeon: Danie Binder, MD;  Location[facility];  Service: Endoscopy;  Laterality: N/A;   Family History  Problem Relation Age of Onset   Hypertension Mother    Colon cancer Neg Hx     Social History   Socioeconomic History   Marital status: Widowed    Spouse name: Not on file   Number of children: Not on file   Years of education: Not on file   Highest education level: Not on file  Occupational History   Not on file  Tobacco Use   Smoking status: Former    Packs/day: 0.25    Years: 20.00    Pack years: 5.00    Types: Cigarettes    Quit date: 01/24/1995    Years since quitting: 25.6   Smokeless tobacco: Never  Vaping Use   Vaping Use: Never used  Substance and Sexual Activity   Alcohol use: No    Alcohol/week: 0.0 standard drinks   Drug use: No   Sexual activity: Never    Birth control/protection:  Post-menopausal  Other Topics Concern   Not on file  Social History Narrative   Not on file   Social Determinants of Health   Financial Resource Strain: Not on file  Food Insecurity: Not on file  Transportation Needs: Not on file  Physical Activity: Not on file  Stress: Not on file  Social Connections: Not on file   Outpatient Encounter Medications as of 09/08/2020  Medication Sig   insulin lispro (HUMALOG KWIKPEN) 100 UNIT/ML KwikPen Inject 10 Units into the skin 3 (three) times daily. If glucose is above 90 and she is eating.   ABILIFY 2 MG tablet Take 2 mg by mouth daily.   acetaminophen (TYLENOL) 325 MG tablet Take 650 mg by mouth 3 (three) times daily as needed for mild pain or  moderate pain.    apixaban (ELIQUIS) 5 MG TABS tablet Take 1 tablet (5 mg total) by mouth 2 (two) times daily.   atorvastatin (LIPITOR) 40 MG tablet Take 1 tablet by mouth daily as needed.   calcium carbonate (TUMS - DOSED IN MG ELEMENTAL CALCIUM) 500 MG chewable tablet Chew 1 tablet by mouth 4 (four) times daily as needed for heartburn.   Carboxymethylcellulose Sodium (ARTIFICIAL TEARS OP) Place 1 drop into both eyes 3 (three) times daily.   Cholecalciferol (VITAMIN D3) 50 MCG (2000 UT) TABS Take 2,000 Units by mouth daily.   clonazePAM (KLONOPIN) 0.5 MG tablet Take 1 tablet (0.5 mg total) by mouth 3 (three) times daily as needed for anxiety.   diltiazem (CARDIZEM CD) 120 MG 24 hr capsule Take 120 mg by mouth daily.   DULoxetine (CYMBALTA) 60 MG capsule Take 60 mg by mouth daily.   EASYMAX TEST test strip USE TO CHECK BLOOD SUGAR TWICE DAILY.   gabapentin (NEURONTIN) 300 MG capsule Take 1 capsule (300 mg total) by mouth 3 (three) times daily as needed.   GOODSENSE ARTIFICIAL TEARS 0.5-0.6 % SOLN Apply to eye.   HYDROcodone-acetaminophen (NORCO) 10-325 MG tablet Take 1 tablet by mouth 3 (three) times daily as needed for severe pain.   hydrOXYzine (ATARAX/VISTARIL) 25 MG tablet Take 1 tablet (25 mg total) by mouth every 8 (eight) hours as needed for anxiety or itching.   insulin detemir (LEVEMIR FLEXTOUCH) 100 UNIT/ML FlexPen Inject 55 Units into the skin at bedtime.   Lancets 28G MISC USE TO CHECK BLOOD SUGAR TWICE DAILY.   latanoprost (XALATAN) 0.005 % ophthalmic solution Place 1 drop into both eyes at bedtime.   loratadine (CLARITIN) 10 MG tablet Take 10 mg by mouth daily.   lubiprostone (AMITIZA) 24 MCG capsule Take 1 capsule (24 mcg total) by mouth 2 (two) times daily with a meal.   metoprolol succinate (TOPROL-XL) 25 MG 24 hr tablet Take 3 tablets (75 mg total) by mouth 2 (two) times daily.   mirabegron ER (MYRBETRIQ) 25 MG TB24 tablet Take 25 mg by mouth daily.   mirtazapine (REMERON) 30  MG tablet Take 30 mg by mouth at bedtime.   ondansetron (ZOFRAN) 4 MG tablet Take 1 tablet (4 mg total) by mouth 2 (two) times daily as needed for nausea or vomiting.   pantoprazole (PROTONIX) 40 MG tablet Take 1 tablet (40 mg total) by mouth daily.   polyethylene glycol (MIRALAX / GLYCOLAX) 17 g packet Take 17 g by mouth daily.   risperiDONE (RISPERDAL) 1 MG tablet Take 1 tablet (1 mg total) by mouth in the morning and at bedtime.   senna (SENOKOT) 8.6 MG TABS tablet Take 1 tablet (8.6  mg total) by mouth daily.   TRADJENTA 5 MG TABS tablet TAKE (1) TABLET BY MOUTH ONCE DAILY.   zolpidem (AMBIEN) 5 MG tablet Take 5 mg by mouth at bedtime as needed.   [DISCONTINUED] diltiazem (DILACOR XR) 120 MG 24 hr capsule Take 120 mg by mouth daily.   [DISCONTINUED] hydrocortisone (ANUSOL-HC) 2.5 % rectal cream PLACE 1 APPLICATION RECTALLY 2 TIMES DAILY AS NEEDED (RECTAL BLEEDING.)   [DISCONTINUED] LEVEMIR FLEXTOUCH 100 UNIT/ML FlexPen INJECT 45 UNITS SUBCUTANEOUSLY AT BEDTIME. (Patient taking differently: 55 Units.)   [DISCONTINUED] midodrine (PROAMATINE) 2.5 MG tablet Take 3 tablets (7.5 mg total) by mouth 3 (three) times daily. (Patient taking differently: Take 10 mg by mouth 3 (three) times daily. Takes 1 tablet by mouth 3times daily)   [DISCONTINUED] simvastatin (ZOCOR) 20 MG tablet Take 20 mg by mouth daily at 6 PM. (Patient not taking: Reported on 07/14/2020)   No facility-administered encounter medications on file as of 09/08/2020.   ALLERGIES: Allergies  Allergen Reactions   Sulfa Antibiotics Rash   VACCINATION STATUS: There is no immunization history for the selected administration types on file for this patient.  Diabetes She presents for her follow-up diabetic visit. She has type 2 diabetes mellitus. Onset time: She was diagnosed at approximate age of 64 years. Her disease course has been worsening. There are no hypoglycemic associated symptoms. Pertinent negatives for hypoglycemia include no  confusion, pallor or seizures. Associated symptoms include fatigue, polydipsia and polyuria. Pertinent negatives for diabetes include no polyphagia and no weight loss. There are no hypoglycemic complications. Symptoms are stable. Diabetic complications include a CVA and heart disease. Risk factors for coronary artery disease include dyslipidemia, diabetes mellitus, obesity, sedentary lifestyle and hypertension. Current diabetic treatment includes oral agent (monotherapy) and insulin injections. She is compliant with treatment all of the time. Her weight is stable. She is following a generally unhealthy diet. When asked about meal planning, she reported none. She has not had a previous visit with a dietitian. She never participates in exercise. Her home blood glucose trend is increasing rapidly. Her breakfast blood glucose range is generally >200 mg/dl. Her bedtime blood glucose range is generally >200 mg/dl. (She presents today, accompanied by representative from the nursing home, with her logs showing persistently high glycemic profile both fasting and postprandial.  Her POCT A1c today is 10.5%, drastically worse than previous visit of of 8.2%.  She reports she consumes mostly soda and cereal for her meals.  I did discuss that she needs to avoid those things given her diabetes and the aide did tell me they have meals suitable for diabetic patients available to them.  There are no episodes of hypoglycemia noted.) An ACE inhibitor/angiotensin II receptor blocker is not being taken. She does not see a podiatrist.Eye exam is current.  Hypertension This is a chronic problem. The current episode started more than 1 year ago. The problem has been gradually improving since onset. The problem is controlled. Pertinent negatives include no palpitations or shortness of breath. There are no associated agents to hypertension. Risk factors for coronary artery disease include dyslipidemia, diabetes mellitus, sedentary lifestyle  and post-menopausal state. Past treatments include beta blockers. The current treatment provides mild improvement. There are no compliance problems.  Hypertensive end-organ damage includes CVA. Identifiable causes of hypertension include a thyroid problem.  Hyperlipidemia This is a chronic problem. The current episode started more than 1 year ago. The problem is controlled. Recent lipid tests were reviewed and are normal. Exacerbating diseases include diabetes.  Factors aggravating her hyperlipidemia include beta blockers. Pertinent negatives include no shortness of breath. Current antihyperlipidemic treatment includes statins. The current treatment provides moderate improvement of lipids. There are no compliance problems.  Risk factors for coronary artery disease include diabetes mellitus, dyslipidemia, hypertension, obesity and a sedentary lifestyle.  Thyroid Problem Presents for follow-up visit. Symptoms include fatigue. Patient reports no palpitations or weight loss. The symptoms have been stable. Past treatments include beta blockers (on beta blockers for other comorbidities). Prior procedures include radioiodine uptake scan. Her past medical history is significant for atrial fibrillation, diabetes and hyperlipidemia. Risk factors include family history of hyperthyroidism.   [name] is 75 y.o. female who presents today with a medical history as above. she is being seen in consultation for hyperthyroidism requested by [name], MD.  she has been dealing with symptoms of anxiety, tremors, weight loss, and palpitations for a few weeks now. These symptoms are progressively worsening and troubling to her.  her most recent thyroid labs revealed suppressed TSH of 0.293 and high normal FT4 of 1.52 on 04/18/20 during her recent hospitalization for SBO and AFib. she denies dysphagia, choking, shortness of breath, no recent voice change.    she does family history of thyroid dysfunction in her mother  (requiring surgery), but denies family hx of thyroid cancer. she denies personal history of goiter. she is not on any anti-thyroid medications nor on any thyroid hormone supplements. Denies use of Biotin containing supplements.  She has never had any previous imaging of her thyroid.  She is willing to proceed with appropriate work up and therapy for thyrotoxicosis.    Review of systems  Constitutional: + rapidly decreasing body weight (per NH staff),  current Body mass index is 20.43 kg/m. , + fatigue, no subjective hyperthermia, no subjective hypothermia Eyes: no blurry vision, no xerophthalmia ENT: no sore throat, no nodules palpated in throat, no dysphagia/odynophagia, no hoarseness Cardiovascular: no chest pain, no shortness of breath, + palpitations (improved), no leg swelling Respiratory: no cough, no shortness of breath Gastrointestinal: no nausea/vomiting/diarrhea- resolved since hospitalization for SBO Musculoskeletal: no muscle/joint aches, essentially WC bound due to previous CVA Skin: no rashes, no hyperemia Neurological: + tremors (improved), no numbness, no tingling, no dizziness Psychiatric: no depression, + anxiety          Objective:    BP 109/61   Pulse 73   Ht '5\' 4"'$  (1.626 m)   BMI 20.43 kg/m   Wt Readings from Last 3 Encounters:  07/14/20 119 lb (54 kg)  05/11/20 122 lb (55.3 kg)  04/23/20 127 lb 6.8 oz (57.8 kg)     BP Readings from Last 3 Encounters:  09/08/20 109/61  07/14/20 (!) 107/51  06/08/20 116/71    Physical Exam- Limited  Constitutional:  Body mass index is 20.43 kg/m., not in acute distress, normal state of mind Eyes:  EOMI, no exophthalmos Neck: Supple Cardiovascular: RRR, + murmur, rubs, or gallops, no edema Respiratory: Adequate breathing efforts, no crackles, rales, rhonchi, or wheezing Musculoskeletal: no gross deformities, strength intact in all four extremities, no gross restriction of joint movements Skin:  no rashes, no  hyperemia Neurological: no tremor     CMP     Component Value Date/Time   NA 134 08/31/2020 0849   K 4.9 08/31/2020 0849   CL 95 (L) 08/31/2020 0849   CO2 25 08/31/2020 0849   GLUCOSE 331 (H) 08/31/2020 0849   GLUCOSE 128 (H) 05/04/2020 0925   BUN 11 08/31/2020 0849  CREATININE 0.67 08/31/2020 0849   CREATININE 0.69 05/04/2020 0925   CALCIUM 9.1 08/31/2020 0849   PROT 7.0 08/31/2020 0849   ALBUMIN 4.3 08/31/2020 0849   AST 12 08/31/2020 0849   ALT 9 08/31/2020 0849   ALKPHOS 108 08/31/2020 0849   BILITOT 0.4 08/31/2020 0849   GFRNONAA >60 04/25/2020 0618   GFRNONAA 91 06/27/2018 0803   GFRAA >60 10/05/2019 0457   GFRAA 106 06/27/2018 0803     CBC    Component Value Date/Time   WBC 7.0 05/04/2020 0930   RBC 4.16 05/04/2020 0930   HGB 12.1 05/04/2020 0930   HCT 36.5 05/04/2020 0930   PLT 398 05/04/2020 0930   MCV 87.7 05/04/2020 0930   MCH 29.1 05/04/2020 0930   MCHC 33.2 05/04/2020 0930   RDW 13.1 05/04/2020 0930   LYMPHSABS 1.1 04/18/2020 1105   MONOABS 0.6 04/18/2020 1105   EOSABS 0.0 04/18/2020 1105   BASOSABS 0.0 04/18/2020 1105     Diabetic Labs (most recent): Lab Results  Component Value Date   HGBA1C 10.5 (A) 09/08/2020   HGBA1C 8.2 (A) 06/08/2020   HGBA1C 9.2 (A) 02/23/2020    Lipid Panel     Component Value Date/Time   CHOL 151 10/05/2019 0457   TRIG 149 10/05/2019 0457   HDL 40 (L) 10/05/2019 0457   CHOLHDL 3.8 10/05/2019 0457   VLDL 30 10/05/2019 0457   LDLCALC 81 10/05/2019 0457     Lab Results  Component Value Date   TSH 0.869 06/03/2020   TSH 0.293 (L) 04/18/2020   TSH 0.578 10/05/2019   TSH 1.138 03/29/2017   TSH 0.538 02/13/2016   FREET4 1.13 06/03/2020   FREET4 1.52 (H) 04/19/2020     Thyroid US from 09/01/20 CLINICAL DATA:  Hyperthyroidism, right thyroid cold nodule by nuclear medicine thyroid scan   EXAM: THYROID ULTRASOUND   TECHNIQUE: Ultrasound examination of the thyroid gland and adjacent soft tissues was  performed.   COMPARISON:  06/02/2020 nuclear medicine thyroid scan   FINDINGS: Parenchymal Echotexture: Moderately heterogenous   Isthmus: 5 mm   Right lobe: 3.6 x 2.3 x 2.3 cm   Left lobe: 3.7 x 1.8 x 1.4 cm   _________________________________________________________   Estimated total number of nodules >/= 1 cm: 2   Number of spongiform nodules >/=  2 cm not described below (TR1): 0   Number of mixed cystic and solid nodules >/= 1.5 cm not described below (TR2): 0   _________________________________________________________   Nodule # 1:   Location: Right; Mid   Maximum size: 2.1 cm; Other 2 dimensions: 1.7 x 1.6 cm   Composition: solid/almost completely solid (2)   Echogenicity: isoechoic (1)   Shape: not taller-than-wide (0)   Margins: ill-defined (0)   Echogenic foci: punctate echogenic foci (3)   ACR TI-RADS total points: 6.   ACR TI-RADS risk category: TR4 (4-6 points).   ACR TI-RADS recommendations:   **Given size (>/= 1.5 cm) and appearance, fine needle aspiration of this moderately suspicious nodule should be considered based on TI-RADS criteria.   _________________________________________________________   Nodule # 2:   Location: Left; Mid   Maximum size: 1.4 cm; Other 2 dimensions: 1.3 x 1.3 cm   Composition: mixed cystic and solid (1)   Echogenicity: hypoechoic (2)   Shape: not taller-than-wide (0)   Margins: ill-defined (0)   Echogenic foci: none (0)   ACR TI-RADS total points: 3.   ACR TI-RADS risk category: TR3 (3 points).   ACR TI-RADS  recommendations:   Given size (<1.4 cm) and appearance, this nodule does NOT meet TI-RADS criteria for biopsy or dedicated follow-up.   _________________________________________________________   Nonspecific thyroid heterogeneity. No hypervascularity. No regional adenopathy.   IMPRESSION: 2.1 cm right mid thyroid TR 4 nodule meets criteria for biopsy as above. This correlates with the  nuclear medicine cold nodule.   The above is in keeping with the ACR TI-RADS recommendations - J Am Coll Radiol 2017;14:587-595.     Electronically Signed   By: Jerilynn Mages.  Shick M.D.   On: 09/01/2020 11:26   Assessment & Plan:   1) Thyroid Nodule  she is being seen at a kind request of [name], MD.  During evaluation for hyperthyroidism, her uptake and scan was identified a cold nodule in right mid thyroid, recommending dedicated thyroid ultrasound to assess completely.  She recently had her thyroid ultrasound which recommended FNA of the moderately suspicious nodule.   We discussed this today and she agrees to proceed with FNA biopsy, will schedule.  2) Type 2 diabetes mellitus with vascular disease ([place])   Her diabetes is complicated by recurrent CVA.   She presents today, accompanied by representative from the nursing home, with her logs showing persistently high glycemic profile both fasting and postprandial.  Her POCT A1c today is 10.5%, drastically worse than previous visit of of 8.2%.  She reports she consumes mostly soda and cereal for her meals.  I did discuss that she needs to avoid those things given her diabetes and the aide did tell me they have meals suitable for diabetic patients available to them.  There are no episodes of hypoglycemia noted.   - Patient remains at a high risk for more acute and chronic complications of diabetes which include CAD, CVA, CKD, retinopathy, and neuropathy. These are all discussed in detail with the patient.   - Nutritional counseling repeated at each appointment due to patients tendency to fall back in to old habits.  - The patient admits there is a room for improvement in their diet and drink choices. -  Suggestion is made for the patient to avoid simple carbohydrates from their diet including Cakes, Sweet Desserts / Pastries, Ice Cream, Soda (diet and regular), Sweet Tea, Candies, Chips, Cookies, Sweet Pastries, Store Bought Juices,  Alcohol in Excess of 1-2 drinks a day, Artificial Sweeteners, Coffee Creamer, and "Sugar-free" Products. This will help patient to have stable blood glucose profile and potentially avoid unintended weight gain.   - I encouraged the patient to switch to unprocessed or minimally processed complex starch and increased protein intake (animal or plant source), fruits, and vegetables.   - Patient is advised to stick to a routine mealtimes to eat 3 meals a day and avoid unnecessary snacks (to snack only to correct hypoglycemia).   - I have approached patient with the following individualized plan to manage diabetes and patient agrees.   -Based on her drastically worse glycemic profile, we discussed the need for prandial insulin and she agreed.  She is hesitant to change her eating habits.   -She is advised to continue Levemir 55 units SQ nightly and will add Humalog 10 units TID with meals if glucose is above 90 and she is eating.  Will hold off on SSI today and reevaluate in 2 weeks with logs.  She can continue her Tradjenta 5 mg po daily.     -She is encouraged to continue monitoring blood glucose 4 times daily, before meals and before bed, and to  call the clinic if she has readings less than 70 or above 300 for 3 tests in a row.    - she is not a suitable candidate for metformin nor SGLT2 inhibitor therapy.     - Patient specific target  for A1c; LDL, HDL, Triglycerides, and  Waist Circumference were discussed in detail.  -Patient is advised to maintain close follow up with [name], MD for primary care needs.      I spent 40 minutes in the care of the patient today including review of labs from [place], Lipids, Thyroid Function, Hematology (current and previous including abstractions from other facilities); face-to-face time discussing  her blood glucose readings/logs, discussing hypoglycemia and hyperglycemia episodes and symptoms, medications doses, her options of short and long term  treatment based on the latest standards of care / guidelines;  discussion about incorporating lifestyle medicine;  and documenting the encounter.    Please refer to Patient Instructions for Blood Glucose Monitoring and Insulin/Medications Dosing Guide"  in media tab for additional information. Please also refer to " Patient Self Inventory" in the Media  tab for reviewed elements of pertinent patient history.  [name] participated in the discussions, expressed understanding, and voiced agreement with the above plans.  All questions were answered to her satisfaction. she is encouraged to contact clinic should she have any questions or concerns prior to her return visit.    Follow up plan: Return in about 3 months (around 12/09/2020) for Diabetes F/U with A1c in office, Previsit labs, Bring meter and logs, Thyroid follow up.    Rayetta Pigg, [facility] [address] [place], [place] [address] Phone: [phone] Fax: [phone]  09/08/2020, 10:01 AM

## 2020-09-08 NOTE — Patient Instructions (Signed)
Diabetes Mellitus and Nutrition, Adult When you have diabetes, or diabetes mellitus, it is very important to have healthy eating habits because your blood sugar (glucose) levels are greatly affected by what you eat and drink. Eating healthy foods in the right amounts, at about the same times every day, can help you:  Control your blood glucose.  Lower your risk of heart disease.  Improve your blood pressure.  Reach or maintain a healthy weight. What can affect my meal plan? Every person with diabetes is different, and each person has different needs for a meal plan. Your health care provider may recommend that you work with a dietitian to make a meal plan that is best for you. Your meal plan may vary depending on factors such as:  The calories you need.  The medicines you take.  Your weight.  Your blood glucose, blood pressure, and cholesterol levels.  Your activity level.  Other health conditions you have, such as heart or kidney disease. How do carbohydrates affect me? Carbohydrates, also called carbs, affect your blood glucose level more than any other type of food. Eating carbs naturally raises the amount of glucose in your blood. Carb counting is a method for keeping track of how many carbs you eat. Counting carbs is important to keep your blood glucose at a healthy level, especially if you use insulin or take certain oral diabetes medicines. It is important to know how many carbs you can safely have in each meal. This is different for every person. Your dietitian can help you calculate how many carbs you should have at each meal and for each snack. How does alcohol affect me? Alcohol can cause a sudden decrease in blood glucose (hypoglycemia), especially if you use insulin or take certain oral diabetes medicines. Hypoglycemia can be a life-threatening condition. Symptoms of hypoglycemia, such as sleepiness, dizziness, and confusion, are similar to symptoms of having too much  alcohol.  Do not drink alcohol if: ? Your health care provider tells you not to drink. ? You are pregnant, may be pregnant, or are planning to become pregnant.  If you drink alcohol: ? Do not drink on an empty stomach. ? Limit how much you use to:  0-1 drink a day for women.  0-2 drinks a day for men. ? Be aware of how much alcohol is in your drink. In the [place] one drink equals one 12 oz bottle of beer (355 mL), one 5 oz glass of wine (148 mL), or one 1 oz glass of hard liquor (44 mL). ? Keep yourself hydrated with water, diet soda, or unsweetened iced tea.  Keep in mind that regular soda, juice, and other mixers may contain a lot of sugar and must be counted as carbs. What are tips for following this plan? Reading food labels  Start by checking the serving size on the "Nutrition Facts" label of packaged foods and drinks. The amount of calories, carbs, fats, and other nutrients listed on the label is based on one serving of the item. Many items contain more than one serving per package.  Check the total grams (g) of carbs in one serving. You can calculate the number of servings of carbs in one serving by dividing the total carbs by 15. For example, if a food has 30 g of total carbs per serving, it would be equal to 2 servings of carbs.  Check the number of grams (g) of saturated fats and trans fats in one serving. Choose foods that have   a low amount or none of these fats.  Check the number of milligrams (mg) of salt (sodium) in one serving. Most people should limit total sodium intake to less than 2,300 mg per day.  Always check the nutrition information of foods labeled as "low-fat" or "nonfat." These foods may be higher in added sugar or refined carbs and should be avoided.  Talk to your dietitian to identify your daily goals for nutrients listed on the label. Shopping  Avoid buying canned, pre-made, or processed foods. These foods tend to be high in fat, sodium, and added  sugar.  Shop around the outside edge of the grocery store. This is where you will most often find fresh fruits and vegetables, bulk grains, fresh meats, and fresh dairy. Cooking  Use low-heat cooking methods, such as baking, instead of high-heat cooking methods like deep frying.  Cook using healthy oils, such as olive, canola, or sunflower oil.  Avoid cooking with butter, cream, or high-fat meats. Meal planning  Eat meals and snacks regularly, preferably at the same times every day. Avoid going long periods of time without eating.  Eat foods that are high in fiber, such as fresh fruits, vegetables, beans, and whole grains. Talk with your dietitian about how many servings of carbs you can eat at each meal.  Eat 4-6 oz (112-168 g) of lean protein each day, such as lean meat, chicken, fish, eggs, or tofu. One ounce (oz) of lean protein is equal to: ? 1 oz (28 g) of meat, chicken, or fish. ? 1 egg. ?  cup (62 g) of tofu.  Eat some foods each day that contain healthy fats, such as avocado, nuts, seeds, and fish.   What foods should I eat? Fruits Berries. Apples. Oranges. Peaches. Apricots. Plums. Grapes. Mango. Papaya. Pomegranate. Kiwi. Cherries. Vegetables Lettuce. Spinach. Leafy greens, including kale, chard, collard greens, and mustard greens. Beets. Cauliflower. Cabbage. Broccoli. Carrots. Green beans. Tomatoes. Peppers. Onions. Cucumbers. Brussels sprouts. Grains Whole grains, such as whole-wheat or whole-grain bread, crackers, tortillas, cereal, and pasta. Unsweetened oatmeal. Quinoa. Brown or wild rice. Meats and other proteins Seafood. Poultry without skin. Lean cuts of poultry and beef. Tofu. Nuts. Seeds. Dairy Low-fat or fat-free dairy products such as milk, yogurt, and cheese. The items listed above may not be a complete list of foods and beverages you can eat. Contact a dietitian for more information. What foods should I avoid? Fruits Fruits canned with  syrup. Vegetables Canned vegetables. Frozen vegetables with butter or cream sauce. Grains Refined white flour and flour products such as bread, pasta, snack foods, and cereals. Avoid all processed foods. Meats and other proteins Fatty cuts of meat. Poultry with skin. Breaded or fried meats. Processed meat. Avoid saturated fats. Dairy Full-fat yogurt, cheese, or milk. Beverages Sweetened drinks, such as soda or iced tea. The items listed above may not be a complete list of foods and beverages you should avoid. Contact a dietitian for more information. Questions to ask a health care provider  Do I need to meet with a diabetes educator?  Do I need to meet with a dietitian?  What number can I call if I have questions?  When are the best times to check my blood glucose? Where to find more information:  American Diabetes Association: diabetes.org  Academy of Nutrition and Dietetics: www.eatright.org  National Institute of Diabetes and Digestive and Kidney Diseases: www.niddk.nih.gov  Association of Diabetes Care and Education Specialists: www.diabeteseducator.org Summary  It is important to have healthy eating   habits because your blood sugar (glucose) levels are greatly affected by what you eat and drink.  A healthy meal plan will help you control your blood glucose and maintain a healthy lifestyle.  Your health care provider may recommend that you work with a dietitian to make a meal plan that is best for you.  Keep in mind that carbohydrates (carbs) and alcohol have immediate effects on your blood glucose levels. It is important to count carbs and to use alcohol carefully. This information is not intended to replace advice given to you by your health care provider. Make sure you discuss any questions you have with your health care provider. Document Revised: 12/17/2018 Document Reviewed: 12/17/2018 Elsevier Patient Education  2021 Elsevier Inc.  

## 2020-09-08 NOTE — Progress Notes (Signed)
Patient Name  [name], [name] Legal Sex  Female DOB  Dec 21, 1945 SSN  [id number] Address  [address]  [place] [address] Phone  [phone] (Home)    RE: Thyroid Biopsy Received: Today Brita Romp, NP  Johnna Acosta, this is ok        Previous Messages   ----- Message -----  From: Garth Bigness D  Sent: 09/08/2020  11:26 AM EDT  To: Brita Romp, NP  Subject: Thyroid Biopsy                                 Hi, Whitney, this patient is on Eliquis and will need to hold for 2 days prior to getting biopsy done. Please advise if okay to hold. Thanks

## 2020-09-09 DIAGNOSIS — M6281 Muscle weakness (generalized): Secondary | ICD-10-CM | POA: Diagnosis not present

## 2020-09-09 DIAGNOSIS — R278 Other lack of coordination: Secondary | ICD-10-CM | POA: Diagnosis not present

## 2020-09-09 NOTE — Progress Notes (Signed)
Virtual Visit via Telephone Note  I connected with [name] on 09/10/20 at 10:00 AM EDT by telephone and verified that I am speaking with the correct person using two identifiers.  Location: Patient: group home Provider: office Persons participated in the visit- patient, provider    I discussed the limitations, risks, security and privacy concerns of performing an evaluation and management service by telephone and the availability of in person appointments. I also discussed with the patient that there may be a patient responsible charge related to this service. The patient expressed understanding and agreed to proceed.  I discussed the assessment and treatment plan with the patient. The patient was provided an opportunity to ask questions and all were answered. The patient agreed with the plan and demonstrated an understanding of the instructions.   The patient was advised to call back or seek an in-person evaluation if the symptoms worsen or if the condition fails to improve as anticipated.  I provided 17 minutes of non-face-to-face time during this encounter.   Courtney Clay, MD    [facility] MD/PA/NP OP Progress Note  09/10/2020 10:26 AM [name]  MRN:  [id number]  Chief Complaint:  Chief Complaint   Follow-up; Schizophrenia    HPI:  This is a follow-up appointment for schizophrenia and depression.  She states that she has leg pain, and she feels down about this.  She attends church services at times when pastor comes to group home.  She spends time watching TV most of the time.  Although she feels depressed and anxious, she denies concern otherwise.  She sleeps well.  She has good appetite.  She denies SI, HI.  She denies paranoia, hallucinations or ideas of reference.   Staff presents to the interview.  She has been doing well.  [name] is on opioid for her leg pain, and sees a provider.  [name] takes medication regularly.  She denies any concern or behavioral issues.     Employment: unemployed. used to work for [employer], on disability around ten years ago after her back surgery Marital status: Widow, her husband deceased after 101 years of marriage in 2010 Number of children: 57, 21 year old daughter. She has a granddaughter  Visit Diagnosis:    ICD-10-CM   1. Schizophrenia, unspecified type ([place])  F20.9 risperiDONE (RISPERDAL) 1 MG tablet    2. Mild episode of recurrent major depressive disorder (HCC)  F33.0       Past Psychiatric History: Please see initial evaluation for full details. I have reviewed the history. No updates at this time.     Past Medical History:  Past Medical History:  Diagnosis Date   Anxiety    Arthritis    Atrial fibrillation ([place])    Collagen vascular disease ([place])    COVID-19 virus infection    COVID-19+ approx 01/24/19; asymptomatic course with full recovery   Dependence on wheelchair    pivot/transfers   Depression    History of psychosis and previous suicide attempt   DVT, lower extremity, recurrent ([place])    Long-term Coumadin per Dr. Legrand Rams   Essential hypertension    GERD (gastroesophageal reflux disease)    Hemiplegia ([place]) 2010   Left side   History of stroke    Acute infarct and right cerebral white matter small vessel disease 12/10   Leg DVT (deep venous thromboembolism), acute ([place]) 2006   Schizophrenia ([place])    Stroke ([place])    left sided weakness   Type 2 diabetes  mellitus ([place])     Past Surgical History:  Procedure Laterality Date   BACK SURGERY     BIOPSY N/A 11/24/2014   Procedure: BIOPSY;  Surgeon: Danie Binder, MD;  Location: [facility];  Service: Endoscopy;  Laterality: N/A;   BIOPSY  05/17/2016   Procedure: BIOPSY;  Surgeon: Danie Binder, MD;  Location: [facility];  Service: Endoscopy;;  gastric biopsy   COLONOSCOPY WITH PROPOFOL N/A 11/24/2014   Dr. Rudie Meyer polyps removed/moderate sized internal hemorrhoids, tubular adenomas. Next surveillance in 3 years   ESOPHAGOGASTRODUODENOSCOPY  (EGD) WITH PROPOFOL N/A 11/24/2014   Dr. Clayburn Pert HH/patent stricture at the gastroesophageal junction, mild non-erosive gastritis, path negative for H.pylori or celiac sprue   ESOPHAGOGASTRODUODENOSCOPY (EGD) WITH PROPOFOL N/A 05/17/2016   Procedure: ESOPHAGOGASTRODUODENOSCOPY (EGD) WITH PROPOFOL;  Surgeon: Danie Binder, MD;  Location: [facility];  Service: Endoscopy;  Laterality: N/A;  12:45pm   GIVENS CAPSULE STUDY N/A 12/11/2014   MULTILPLE EROSION IN the stomach WITH ACTIVE OOZING. OCCASIONAL EROSIONS AND RARE ULCER SEEN IN PROXIMAL SMALL BOWEL . No masses or AVMs SEEN. NO OLD BLOOD OR FRESH BLOOD SEEN.    POLYPECTOMY N/A 11/24/2014   Procedure: POLYPECTOMY;  Surgeon: Danie Binder, MD;  Location: [facility];  Service: Endoscopy;  Laterality: N/A;   SAVORY DILATION N/A 05/17/2016   Procedure: SAVORY DILATION;  Surgeon: Danie Binder, MD;  Location: [facility];  Service: Endoscopy;  Laterality: N/A;    Family Psychiatric History: Please see initial evaluation for full details. I have reviewed the history. No updates at this time.     Family History:  Family History  Problem Relation Age of Onset   Hypertension Mother    Colon cancer Neg Hx     Social History:  Social History   Socioeconomic History   Marital status: Widowed    Spouse name: Not on file   Number of children: Not on file   Years of education: Not on file   Highest education level: Not on file  Occupational History   Not on file  Tobacco Use   Smoking status: Former    Packs/day: 0.25    Years: 20.00    Pack years: 5.00    Types: Cigarettes    Quit date: 01/24/1995    Years since quitting: 25.6   Smokeless tobacco: Never  Vaping Use   Vaping Use: Never used  Substance and Sexual Activity   Alcohol use: No    Alcohol/week: 0.0 standard drinks   Drug use: No   Sexual activity: Never    Birth control/protection: Post-menopausal  Other Topics Concern   Not on file  Social History Narrative   Not  on file   Social Determinants of Health   Financial Resource Strain: Not on file  Food Insecurity: Not on file  Transportation Needs: Not on file  Physical Activity: Not on file  Stress: Not on file  Social Connections: Not on file    Allergies:  Allergies  Allergen Reactions   Sulfa Antibiotics Rash    Metabolic Disorder Labs: Lab Results  Component Value Date   HGBA1C 10.5 (A) 09/08/2020   MPG 217.34 02/21/2020   MPG 257.52 10/05/2019   No results found for: PROLACTIN Lab Results  Component Value Date   CHOL 151 10/05/2019   TRIG 149 10/05/2019   HDL 40 (L) 10/05/2019   CHOLHDL 3.8 10/05/2019   VLDL 30 10/05/2019   LDLCALC 81 10/05/2019   LDLCALC 55 10/07/2016  Lab Results  Component Value Date   TSH 0.869 06/03/2020   TSH 0.293 (L) 04/18/2020    Therapeutic Level Labs: No results found for: LITHIUM No results found for: VALPROATE No components found for:  CBMZ  Current Medications: Current Outpatient Medications  Medication Sig Dispense Refill   ABILIFY 2 MG tablet Take 2 mg by mouth daily.     acetaminophen (TYLENOL) 325 MG tablet Take 650 mg by mouth 3 (three) times daily as needed for mild pain or moderate pain.      apixaban (ELIQUIS) 5 MG TABS tablet Take 1 tablet (5 mg total) by mouth 2 (two) times daily. 60 tablet 0   atorvastatin (LIPITOR) 40 MG tablet Take 1 tablet by mouth daily as needed.     calcium carbonate (TUMS - DOSED IN MG ELEMENTAL CALCIUM) 500 MG chewable tablet Chew 1 tablet by mouth 4 (four) times daily as needed for heartburn.     Carboxymethylcellulose Sodium (ARTIFICIAL TEARS OP) Place 1 drop into both eyes 3 (three) times daily.     Cholecalciferol (VITAMIN D3) 50 MCG (2000 UT) TABS Take 2,000 Units by mouth daily.     clonazePAM (KLONOPIN) 0.5 MG tablet Take 1 tablet (0.5 mg total) by mouth 3 (three) times daily as needed for anxiety. 12 tablet 0   diltiazem (CARDIZEM CD) 120 MG 24 hr capsule Take 120 mg by mouth daily.      DULoxetine (CYMBALTA) 60 MG capsule Take 60 mg by mouth daily.     EASYMAX TEST test strip USE TO CHECK BLOOD SUGAR TWICE DAILY. 200 strip 2   gabapentin (NEURONTIN) 300 MG capsule Take 1 capsule (300 mg total) by mouth 3 (three) times daily as needed.     GOODSENSE ARTIFICIAL TEARS 0.5-0.6 % SOLN Apply to eye.     HYDROcodone-acetaminophen (NORCO) 10-325 MG tablet Take 1 tablet by mouth 3 (three) times daily as needed for severe pain. 12 tablet 0   hydrOXYzine (ATARAX/VISTARIL) 25 MG tablet Take 1 tablet (25 mg total) by mouth every 8 (eight) hours as needed for anxiety or itching. 30 tablet 0   insulin detemir (LEVEMIR FLEXTOUCH) 100 UNIT/ML FlexPen Inject 55 Units into the skin at bedtime. 45 mL 3   insulin lispro (HUMALOG KWIKPEN) 100 UNIT/ML KwikPen Inject 10 Units into the skin 3 (three) times daily. If glucose is above 90 and she is eating. 15 mL 3   Lancets 28G MISC USE TO CHECK BLOOD SUGAR TWICE DAILY. 100 each 5   latanoprost (XALATAN) 0.005 % ophthalmic solution Place 1 drop into both eyes at bedtime.     loratadine (CLARITIN) 10 MG tablet Take 10 mg by mouth daily.     lubiprostone (AMITIZA) 24 MCG capsule Take 1 capsule (24 mcg total) by mouth 2 (two) times daily with a meal. 60 capsule 5   metoprolol succinate (TOPROL-XL) 25 MG 24 hr tablet Take 3 tablets (75 mg total) by mouth 2 (two) times daily. 90 tablet 3   mirabegron ER (MYRBETRIQ) 25 MG TB24 tablet Take 25 mg by mouth daily.     mirtazapine (REMERON) 30 MG tablet Take 30 mg by mouth at bedtime.     ondansetron (ZOFRAN) 4 MG tablet Take 1 tablet (4 mg total) by mouth 2 (two) times daily as needed for nausea or vomiting.     pantoprazole (PROTONIX) 40 MG tablet Take 1 tablet (40 mg total) by mouth daily. 270 tablet 3   polyethylene glycol (MIRALAX / GLYCOLAX) 17 g packet  Take 17 g by mouth daily. 14 each 0   [START ON 09/24/2020] risperiDONE (RISPERDAL) 1 MG tablet Take 1 tablet (1 mg total) by mouth in the morning and at bedtime.  60 tablet 4   senna (SENOKOT) 8.6 MG TABS tablet Take 1 tablet (8.6 mg total) by mouth daily. 120 tablet 0   TRADJENTA 5 MG TABS tablet TAKE (1) TABLET BY MOUTH ONCE DAILY. 30 tablet 3   zolpidem (AMBIEN) 5 MG tablet Take 5 mg by mouth at bedtime as needed.     No current facility-administered medications for this visit.     Musculoskeletal: Strength & Muscle Tone:  N/A Gait & Station:  N/A Patient leans: N/A  Psychiatric Specialty Exam: Review of Systems  Psychiatric/Behavioral:  Positive for dysphoric mood. Negative for agitation, behavioral problems, confusion, decreased concentration, hallucinations, self-injury, sleep disturbance and suicidal ideas. The patient is nervous/anxious. The patient is not hyperactive.   All other systems reviewed and are negative.  There were no vitals taken for this visit.There is no height or weight on file to calculate BMI.  General Appearance: NA  Eye Contact:  NA  Speech:  Clear and Coherent  Volume:  Normal  Mood:  Depressed  Affect:  NA  Thought Process:  Coherent  Orientation:  Full (Time, Place, and Person)  Thought Content: Logical   Suicidal Thoughts:  No  Homicidal Thoughts:  No  Memory:  Immediate;   Good  Judgement:  Fair  Insight:  Shallow  Psychomotor Activity:  Normal  Concentration:  Concentration: Good and Attention Span: Good  Recall:  Good  Fund of Knowledge: Good  Language: Good  Akathisia:  No  Handed:  Right  AIMS (if indicated): not done  Assets:  Communication Skills Desire for Improvement  ADL's:  Intact  Cognition: WNL  Sleep:  Good   Screenings: PHQ2-9    Flowsheet Row Office Visit from 06/14/2020 in [facility] Video Visit from 04/06/2020 in [facility] Office Visit from 11/28/2017 in [facility] Office Visit from 09/25/2017 in [facility] Office Visit from 03/28/2017 in [facility]   PHQ-2 Total Score 6 5 0 0 0  PHQ-9 Total Score 6 9 -- -- --      Flowsheet Row ED to Hosp-Admission (Discharged) from 04/18/2020 in [facility] [place] Video Visit from 04/06/2020 in [facility] No Risk No Risk        Assessment and Plan:  ALETIA TOBAR is a 75 y.o. year old female with a history of schizophrenia, depression,  SVT, DVT, history of stroke on warfarin, type II diabetes, hypertension, GERD, who presents for follow up appointment for below.   1. Schizophrenia, unspecified type ([place]) 2. Mild episode of recurrent major depressive disorder ([place]) Although she reports occasional depressive symptoms and an anxiety, it has been relatively stable, and the staff denies any concern.  Will continue Risperdal and Abilify to target schizophrenia.  Noted that although it is preferable to try monotherapy especially given her diabetes/potential long-term side effect, she had worsening in withdrawal symptoms and aggression in the context of tapering off Risperdal in the past.  It is considered that benefits of treating her condition outweighs risk.   This clinician has discussed the side effect associated with medication prescribed during this encounter. Please refer to notes in the previous encounters for more details.     Plan 1. Continue Abilify 2 mg at night  2. Continue Risperidone 1 mg twice a day  3. Next appointment:  12/15 at 11 AM, virtual visit  - Fax AVS to [phone] - on clonazepam 0.5 mg three times a day for anxiety (prescribed by Dr. Merlene Laughter) - on mirtazapine 30 mg at night, prescribed by Dr. Legrand Rams - on duloxetine 120 mg daily (prescribed by Dr. Legrand Rams  - on Zolpidem 10 mg at night - prescribed by Dr. Legrand Rams -on gabapentin 300 mg TID     The patient demonstrates the following risk factors for suicide: Chronic risk factors for suicide include: psychiatric disorder of schizophrenia by history, depression.  Acute risk factors for suicide include: unemployment. Protective factors for this patient include: positive social support and hope for the future. Considering these factors, the overall suicide risk at this point appears to be low. Patient is appropriate for outpatient follow up.        Courtney Clay, MD 09/10/2020, 10:26 AM

## 2020-09-10 ENCOUNTER — Telehealth (INDEPENDENT_AMBULATORY_CARE_PROVIDER_SITE_OTHER): Payer: Medicare Other | Admitting: Psychiatry

## 2020-09-10 ENCOUNTER — Encounter: Payer: Self-pay | Admitting: Psychiatry

## 2020-09-10 ENCOUNTER — Other Ambulatory Visit: Payer: Self-pay

## 2020-09-10 DIAGNOSIS — F209 Schizophrenia, unspecified: Secondary | ICD-10-CM

## 2020-09-10 DIAGNOSIS — F330 Major depressive disorder, recurrent, mild: Secondary | ICD-10-CM

## 2020-09-10 MED ORDER — RISPERIDONE 1 MG PO TABS
1.0000 mg | ORAL_TABLET | Freq: Two times a day (BID) | ORAL | 4 refills | Status: DC
Start: 2020-09-24 — End: 2021-01-06

## 2020-09-10 NOTE — Patient Instructions (Addendum)
1. Continue Abilify 2 mg at night 2. Continue Risperidone 1 mg twice a day  3. Next appointment:  12/15 at 11 AM, virtual visit

## 2020-09-15 DIAGNOSIS — Z20828 Contact with and (suspected) exposure to other viral communicable diseases: Secondary | ICD-10-CM | POA: Diagnosis not present

## 2020-09-15 DIAGNOSIS — U071 COVID-19: Secondary | ICD-10-CM | POA: Diagnosis not present

## 2020-09-23 ENCOUNTER — Telehealth: Payer: Self-pay | Admitting: Nurse Practitioner

## 2020-09-23 ENCOUNTER — Ambulatory Visit (HOSPITAL_COMMUNITY): Admission: RE | Admit: 2020-09-23 | Payer: Medicare Other | Source: Ambulatory Visit

## 2020-09-23 DIAGNOSIS — E1142 Type 2 diabetes mellitus with diabetic polyneuropathy: Secondary | ICD-10-CM | POA: Diagnosis not present

## 2020-09-23 DIAGNOSIS — I10 Essential (primary) hypertension: Secondary | ICD-10-CM | POA: Diagnosis not present

## 2020-09-23 NOTE — Telephone Encounter (Signed)
[name] with [facility] stated they need a written order to hold pt's eliquis for 48hrs prior to biopsy.

## 2020-09-23 NOTE — Telephone Encounter (Signed)
[name] called from [facility] and states the patient has a biopsy 9/14 and was informed patient needs to hold Eliquis sept 12th, 13th and morning of the 14th. She is needing the okay from the office to do so. [phone]

## 2020-09-29 NOTE — Telephone Encounter (Signed)
Order was faxed.

## 2020-09-30 ENCOUNTER — Other Ambulatory Visit: Payer: Self-pay | Admitting: "Endocrinology

## 2020-10-06 ENCOUNTER — Other Ambulatory Visit: Payer: Self-pay

## 2020-10-06 ENCOUNTER — Encounter (HOSPITAL_COMMUNITY): Payer: Self-pay

## 2020-10-06 ENCOUNTER — Ambulatory Visit (HOSPITAL_COMMUNITY)
Admission: RE | Admit: 2020-10-06 | Discharge: 2020-10-06 | Disposition: A | Discharge status: Home / Self Care | Payer: Medicare Other | Source: Ambulatory Visit | Attending: Nurse Practitioner | Admitting: Nurse Practitioner

## 2020-10-06 DIAGNOSIS — E041 Nontoxic single thyroid nodule: Secondary | ICD-10-CM | POA: Diagnosis not present

## 2020-10-06 DIAGNOSIS — C73 Malignant neoplasm of thyroid gland: Secondary | ICD-10-CM | POA: Diagnosis not present

## 2020-10-06 MED ORDER — LIDOCAINE HCL (PF) 2 % IJ SOLN
INTRAMUSCULAR | Status: AC
  Administered 2020-10-06: 10 mL
  Filled 2020-10-06: qty 10

## 2020-10-06 MED ORDER — LIDOCAINE HCL (PF) 2 % IJ SOLN
INTRAMUSCULAR | Status: AC
  Filled 2020-10-06: qty 10

## 2020-10-06 MED ORDER — LIDOCAINE HCL (PF) 2 % IJ SOLN: 10.0000 mL | Freq: Once | INTRAMUSCULAR | Status: AC

## 2020-10-06 NOTE — Progress Notes (Signed)
PT tolerated thyroid biopsy procedure well today and staff from [facility] given paper discharge instructions and ice pack for the patient at discharge. Labs obtained and sent to lab for processing at this time and patient left via wheelchair with assisted living facility staff member.

## 2020-10-07 LAB — CYTOLOGY - NON PAP

## 2020-10-19 DIAGNOSIS — G894 Chronic pain syndrome: Secondary | ICD-10-CM | POA: Diagnosis not present

## 2020-10-19 DIAGNOSIS — G2401 Drug induced subacute dyskinesia: Secondary | ICD-10-CM | POA: Diagnosis not present

## 2020-10-19 DIAGNOSIS — M5000 Cervical disc disorder with myelopathy, unspecified cervical region: Secondary | ICD-10-CM | POA: Diagnosis not present

## 2020-10-19 DIAGNOSIS — I69359 Hemiplegia and hemiparesis following cerebral infarction affecting unspecified side: Secondary | ICD-10-CM | POA: Diagnosis not present

## 2020-10-19 DIAGNOSIS — Z79891 Long term (current) use of opiate analgesic: Secondary | ICD-10-CM | POA: Diagnosis not present

## 2020-10-19 DIAGNOSIS — M5459 Other low back pain: Secondary | ICD-10-CM | POA: Diagnosis not present

## 2020-10-19 DIAGNOSIS — F419 Anxiety disorder, unspecified: Secondary | ICD-10-CM | POA: Diagnosis not present

## 2020-10-21 ENCOUNTER — Encounter (HOSPITAL_COMMUNITY): Payer: Self-pay | Admitting: Radiology

## 2020-10-23 DIAGNOSIS — I10 Essential (primary) hypertension: Secondary | ICD-10-CM | POA: Diagnosis not present

## 2020-10-23 DIAGNOSIS — E1142 Type 2 diabetes mellitus with diabetic polyneuropathy: Secondary | ICD-10-CM | POA: Diagnosis not present

## 2020-10-27 DIAGNOSIS — Z23 Encounter for immunization: Secondary | ICD-10-CM | POA: Diagnosis not present

## 2020-11-04 DIAGNOSIS — M79671 Pain in right foot: Secondary | ICD-10-CM | POA: Diagnosis not present

## 2020-11-04 DIAGNOSIS — M79674 Pain in right toe(s): Secondary | ICD-10-CM | POA: Diagnosis not present

## 2020-11-04 DIAGNOSIS — E1140 Type 2 diabetes mellitus with diabetic neuropathy, unspecified: Secondary | ICD-10-CM | POA: Diagnosis not present

## 2020-11-04 DIAGNOSIS — M79672 Pain in left foot: Secondary | ICD-10-CM | POA: Diagnosis not present

## 2020-11-04 DIAGNOSIS — M79675 Pain in left toe(s): Secondary | ICD-10-CM | POA: Diagnosis not present

## 2020-11-04 DIAGNOSIS — I739 Peripheral vascular disease, unspecified: Secondary | ICD-10-CM | POA: Diagnosis not present

## 2020-11-08 ENCOUNTER — Other Ambulatory Visit: Payer: Self-pay | Admitting: "Endocrinology

## 2020-11-09 ENCOUNTER — Other Ambulatory Visit: Payer: Self-pay | Admitting: Psychiatry

## 2020-11-09 DIAGNOSIS — G8190 Hemiplegia, unspecified affecting unspecified side: Secondary | ICD-10-CM | POA: Diagnosis not present

## 2020-11-09 DIAGNOSIS — E1142 Type 2 diabetes mellitus with diabetic polyneuropathy: Secondary | ICD-10-CM | POA: Diagnosis not present

## 2020-11-09 DIAGNOSIS — M159 Polyosteoarthritis, unspecified: Secondary | ICD-10-CM | POA: Diagnosis not present

## 2020-11-09 DIAGNOSIS — I10 Essential (primary) hypertension: Secondary | ICD-10-CM | POA: Diagnosis not present

## 2020-11-09 MED ORDER — ABILIFY 2 MG PO TABS: 2.0000 mg | ORAL_TABLET | Freq: Every day | ORAL | 5 refills | Status: DC

## 2020-11-11 DIAGNOSIS — G894 Chronic pain syndrome: Secondary | ICD-10-CM | POA: Diagnosis not present

## 2020-11-11 DIAGNOSIS — Z794 Long term (current) use of insulin: Secondary | ICD-10-CM | POA: Diagnosis not present

## 2020-11-11 DIAGNOSIS — I10 Essential (primary) hypertension: Secondary | ICD-10-CM | POA: Diagnosis not present

## 2020-11-11 DIAGNOSIS — I4891 Unspecified atrial fibrillation: Secondary | ICD-10-CM | POA: Diagnosis not present

## 2020-11-11 DIAGNOSIS — F323 Major depressive disorder, single episode, severe with psychotic features: Secondary | ICD-10-CM | POA: Diagnosis not present

## 2020-11-11 DIAGNOSIS — I951 Orthostatic hypotension: Secondary | ICD-10-CM | POA: Diagnosis not present

## 2020-11-11 DIAGNOSIS — F209 Schizophrenia, unspecified: Secondary | ICD-10-CM | POA: Diagnosis not present

## 2020-11-11 DIAGNOSIS — E785 Hyperlipidemia, unspecified: Secondary | ICD-10-CM | POA: Diagnosis not present

## 2020-11-11 DIAGNOSIS — I69354 Hemiplegia and hemiparesis following cerebral infarction affecting left non-dominant side: Secondary | ICD-10-CM | POA: Diagnosis not present

## 2020-11-11 DIAGNOSIS — K5900 Constipation, unspecified: Secondary | ICD-10-CM | POA: Diagnosis not present

## 2020-11-11 DIAGNOSIS — Z7984 Long term (current) use of oral hypoglycemic drugs: Secondary | ICD-10-CM | POA: Diagnosis not present

## 2020-11-11 DIAGNOSIS — R569 Unspecified convulsions: Secondary | ICD-10-CM | POA: Diagnosis not present

## 2020-11-11 DIAGNOSIS — E1165 Type 2 diabetes mellitus with hyperglycemia: Secondary | ICD-10-CM | POA: Diagnosis not present

## 2020-11-11 DIAGNOSIS — F418 Other specified anxiety disorders: Secondary | ICD-10-CM | POA: Diagnosis not present

## 2020-11-11 DIAGNOSIS — I82509 Chronic embolism and thrombosis of unspecified deep veins of unspecified lower extremity: Secondary | ICD-10-CM | POA: Diagnosis not present

## 2020-11-11 DIAGNOSIS — M159 Polyosteoarthritis, unspecified: Secondary | ICD-10-CM | POA: Diagnosis not present

## 2020-11-11 DIAGNOSIS — H409 Unspecified glaucoma: Secondary | ICD-10-CM | POA: Diagnosis not present

## 2020-11-11 DIAGNOSIS — K219 Gastro-esophageal reflux disease without esophagitis: Secondary | ICD-10-CM | POA: Diagnosis not present

## 2020-11-11 DIAGNOSIS — Z7901 Long term (current) use of anticoagulants: Secondary | ICD-10-CM | POA: Diagnosis not present

## 2020-11-11 DIAGNOSIS — E1142 Type 2 diabetes mellitus with diabetic polyneuropathy: Secondary | ICD-10-CM | POA: Diagnosis not present

## 2020-11-11 DIAGNOSIS — Z0001 Encounter for general adult medical examination with abnormal findings: Secondary | ICD-10-CM | POA: Diagnosis not present

## 2020-11-15 DIAGNOSIS — I951 Orthostatic hypotension: Secondary | ICD-10-CM | POA: Diagnosis not present

## 2020-11-15 DIAGNOSIS — E1165 Type 2 diabetes mellitus with hyperglycemia: Secondary | ICD-10-CM | POA: Diagnosis not present

## 2020-11-15 DIAGNOSIS — I4891 Unspecified atrial fibrillation: Secondary | ICD-10-CM | POA: Diagnosis not present

## 2020-11-15 DIAGNOSIS — I69354 Hemiplegia and hemiparesis following cerebral infarction affecting left non-dominant side: Secondary | ICD-10-CM | POA: Diagnosis not present

## 2020-11-15 DIAGNOSIS — I82509 Chronic embolism and thrombosis of unspecified deep veins of unspecified lower extremity: Secondary | ICD-10-CM | POA: Diagnosis not present

## 2020-11-15 DIAGNOSIS — E1142 Type 2 diabetes mellitus with diabetic polyneuropathy: Secondary | ICD-10-CM | POA: Diagnosis not present

## 2020-11-17 ENCOUNTER — Ambulatory Visit (INDEPENDENT_AMBULATORY_CARE_PROVIDER_SITE_OTHER): Payer: Medicare Other | Admitting: Gastroenterology

## 2020-11-17 ENCOUNTER — Encounter: Payer: Self-pay | Admitting: Gastroenterology

## 2020-11-17 ENCOUNTER — Telehealth: Payer: Self-pay

## 2020-11-17 ENCOUNTER — Other Ambulatory Visit: Payer: Self-pay

## 2020-11-17 VITALS — BP 114/56 | HR 68 | Temp 96.8°F | Ht 64.0 in | Wt 131.0 lb

## 2020-11-17 DIAGNOSIS — K219 Gastro-esophageal reflux disease without esophagitis: Secondary | ICD-10-CM

## 2020-11-17 DIAGNOSIS — R634 Abnormal weight loss: Secondary | ICD-10-CM

## 2020-11-17 DIAGNOSIS — K5900 Constipation, unspecified: Secondary | ICD-10-CM | POA: Diagnosis not present

## 2020-11-17 DIAGNOSIS — K582 Mixed irritable bowel syndrome: Secondary | ICD-10-CM

## 2020-11-17 DIAGNOSIS — R110 Nausea: Secondary | ICD-10-CM | POA: Diagnosis not present

## 2020-11-17 MED ORDER — PANTOPRAZOLE SODIUM 40 MG PO TBEC: 40.0000 mg | DELAYED_RELEASE_TABLET | Freq: Two times a day (BID) | ORAL | 1 refills | Status: AC

## 2020-11-17 MED ORDER — TRULANCE 3 MG PO TABS: 3.0000 mg | ORAL_TABLET | Freq: Every day | ORAL | 5 refills | Status: DC

## 2020-11-17 NOTE — Telephone Encounter (Signed)
PA done for Trulance 3 mg. Pt has tried and failed all Linzess, Amitiza 24 mg, Miralax, Senokot, Colace. Dx used: K59.04, K59.00. waiting on a response.

## 2020-11-17 NOTE — Patient Instructions (Addendum)
Stop lubiprostone (Amitiza). This may be contributing to her nausea if she is taking on empty stomach. Start Trulance 3mg  daily.  Increase pantoprazole to 40mg  twice daily, 30 minutes before breakfast and 30 minutes before evening meal.  We have offered upper endoscopy or gastric emptying study to further evaluate patient's poor appetite and nausea but she declined today. She prefers to see if above changes help and if not consider after couple of weeks. If no significant improvement with changes in medications as outlined above, would consider scheduling further testing as outlined.  Please call and let us know patient's decision. Return office visit in four months.

## 2020-11-17 NOTE — Progress Notes (Signed)
Primary Care Physician: [name], MD  Primary Gastroenterologist:  Elon Alas. Abbey Chatters, DO   Chief Complaint  Patient presents with   Gastroesophageal Reflux   Diarrhea   Nausea    HPI: [name] is a 75 y.o. female here for follow-up.  She has a history of chronic GERD.  Last seen June 2022.  Due to refractory symptoms she had been on pantoprazole twice daily.  She also has chronic constipation controlled with MiraLAX.  Hospitalized earlier this year with partial small bowel obstruction/obstipation.  Treated conservatively and recovered well.  History of weight loss.  Patient presents today from assisted living.  She provides her own history.  She has been able to put on some weight.  States she is drinking Ensure once or twice per day.  Over the past month she complains of daily nausea but no vomiting.  Pantoprazole now only once daily.  Staff states that they are having a hard time getting her to eat.  Often taking medications on empty stomach.  Does not eat very much your breakfast sometimes improves throughout the day.  Patient states she does not have any snacks between meals.  Has very little lower abdominal pain which does not concern her.  No melena or rectal bleeding.  Bowel movements once daily, stools are loose.  She states she started on insulin a couple months ago and she believes this is what is contributing to her nausea.  September 2021 weight 128 pounds April 2022 weight 127 pounds June 2022 119 pounds Most recently monthly weighs 127 pounds   Current Outpatient Medications  Medication Sig Dispense Refill   ABILIFY 2 MG tablet Take 1 tablet (2 mg total) by mouth daily. 30 tablet 5   acetaminophen (TYLENOL) 325 MG tablet Take 650 mg by mouth 3 (three) times daily as needed for mild pain or moderate pain.      apixaban (ELIQUIS) 5 MG TABS tablet Take 1 tablet (5 mg total) by mouth 2 (two) times daily. 60 tablet 0   atorvastatin (LIPITOR) 40 MG tablet  Take 1 tablet by mouth daily as needed.     calcium carbonate (TUMS - DOSED IN MG ELEMENTAL CALCIUM) 500 MG chewable tablet Chew 1 tablet by mouth 4 (four) times daily as needed for heartburn.     Carboxymethylcellulose Sodium (ARTIFICIAL TEARS OP) Place 1 drop into both eyes 3 (three) times daily.     Cholecalciferol (VITAMIN D3) 50 MCG (2000 UT) TABS Take 2,000 Units by mouth daily.     clonazePAM (KLONOPIN) 0.5 MG tablet Take 1 tablet (0.5 mg total) by mouth 3 (three) times daily as needed for anxiety. (Patient taking differently: Take 0.5 mg by mouth 3 (three) times daily.) 12 tablet 0   diltiazem (CARDIZEM CD) 120 MG 24 hr capsule Take 120 mg by mouth daily.     DULoxetine (CYMBALTA) 60 MG capsule Take 60 mg by mouth daily.     gabapentin (NEURONTIN) 300 MG capsule Take 1 capsule (300 mg total) by mouth 3 (three) times daily as needed. (Patient taking differently: Take 300 mg by mouth 3 (three) times daily.)     glucose blood (EASYMAX TEST) test strip Use to check blood glucose 4 times daily, before meals and before bed, call the clinic if she has readings < 70 or > 300 for 3 tests in a row 400 strip 2   GOODSENSE ARTIFICIAL TEARS 0.5-0.6 % SOLN Apply to eye.     HYDROcodone-acetaminophen (  NORCO) 10-325 MG tablet Take 1 tablet by mouth 3 (three) times daily as needed for severe pain. (Patient taking differently: Take 1 tablet by mouth 3 (three) times daily.) 12 tablet 0   hydrOXYzine (ATARAX/VISTARIL) 25 MG tablet Take 1 tablet (25 mg total) by mouth every 8 (eight) hours as needed for anxiety or itching. (Patient taking differently: Take 25 mg by mouth 3 (three) times daily.) 30 tablet 0   insulin detemir (LEVEMIR FLEXTOUCH) 100 UNIT/ML FlexPen Inject 55 Units into the skin at bedtime. 45 mL 3   Lancets 28G MISC USE TO CHECK BLOOD SUGAR TWICE DAILY. 100 each 5   latanoprost (XALATAN) 0.005 % ophthalmic solution Place 1 drop into both eyes at bedtime.     loratadine (CLARITIN) 10 MG tablet Take  10 mg by mouth daily.     lubiprostone (AMITIZA) 24 MCG capsule Take 1 capsule (24 mcg total) by mouth 2 (two) times daily with a meal. 60 capsule 5   metoprolol succinate (TOPROL-XL) 25 MG 24 hr tablet Take 3 tablets (75 mg total) by mouth 2 (two) times daily. 90 tablet 3   midodrine (PROAMATINE) 10 MG tablet Take 10 mg by mouth 3 (three) times daily.     mirabegron ER (MYRBETRIQ) 25 MG TB24 tablet Take 25 mg by mouth daily.     mirtazapine (REMERON) 30 MG tablet Take 30 mg by mouth at bedtime.     NOVOLOG FLEXPEN 100 UNIT/ML FlexPen Inject 10 Units into the skin 3 (three) times daily with meals.     Nutritional Supplements (ENSURE COMPLETE PO) Take by mouth daily.     ondansetron (ZOFRAN) 4 MG tablet Take 1 tablet (4 mg total) by mouth 2 (two) times daily as needed for nausea or vomiting. (Patient taking differently: Take 4 mg by mouth 4 (four) times daily -  with meals and at bedtime.)     pantoprazole (PROTONIX) 40 MG tablet Take 1 tablet (40 mg total) by mouth daily. 270 tablet 3   polyethylene glycol (MIRALAX / GLYCOLAX) 17 g packet Take 17 g by mouth daily. 14 each 0   risperiDONE (RISPERDAL) 1 MG tablet Take 1 tablet (1 mg total) by mouth in the morning and at bedtime. 60 tablet 4   senna (SENOKOT) 8.6 MG TABS tablet Take 1 tablet (8.6 mg total) by mouth daily. 120 tablet 0   TRADJENTA 5 MG TABS tablet TAKE (1) TABLET BY MOUTH ONCE DAILY. 30 tablet 3   zolpidem (AMBIEN) 5 MG tablet Take 5 mg by mouth at bedtime as needed.     insulin lispro (HUMALOG KWIKPEN) 100 UNIT/ML KwikPen Inject 10 Units into the skin 3 (three) times daily. If glucose is above 90 and she is eating. (Patient not taking: Reported on 11/17/2020) 15 mL 3   No current facility-administered medications for this visit.    Allergies as of 11/17/2020 - Review Complete 11/17/2020  Allergen Reaction Noted   Sulfa antibiotics Rash 02/23/2010    ROS:  General: Negative for  weight loss, fever, chills, fatigue, weakness.   See HPI ENT: Negative for hoarseness, difficulty swallowing , nasal congestion. CV: Negative for chest pain, angina, palpitations, dyspnea on exertion, peripheral edema.  Respiratory: Negative for dyspnea at rest, dyspnea on exertion, cough, sputum, wheezing.  GI: See history of present illness. GU:  Negative for dysuria, hematuria, urinary incontinence, urinary frequency, nocturnal urination.  Endo: Negative for unusual weight change.    Physical Examination:   BP (!) 114/56   Pulse 68  Temp (!) 96.8 F (36 C) (Temporal)   Ht 5\' 4"  (1.626 m)   Wt 131 lb (59.4 kg) Comment: stated by pt, in w/c  BMI 22.49 kg/m   General: Chronically ill-appearing woman in a wheelchair.  Alert and oriented and able to provide appropriate history.  Eyes: No icterus. Mouth: masked. Lungs: Clear to auscultation bilaterally.  Heart: Regular rate and rhythm, no  rubs or gallops. Harsh 2 out of 6 SEM Abdomen: Bowel sounds are normal, nontender, nondistended, no hepatosplenomegaly or masses, no abdominal bruits or hernia , no rebound or guarding.   Extremities: No lower extremity edema. No clubbing or deformities. Neuro: Alert and oriented x 4   Skin: Warm and dry, no jaundice.   Psych: Alert and cooperative, normal mood and affect.  Labs:  Lab Results  Component Value Date   CREATININE 0.67 08/31/2020   BUN 11 08/31/2020   NA 134 08/31/2020   K 4.9 08/31/2020   CL 95 (L) 08/31/2020   CO2 25 08/31/2020   Lab Results  Component Value Date   ALT 9 08/31/2020   AST 12 08/31/2020   ALKPHOS 108 08/31/2020   BILITOT 0.4 08/31/2020   Lab Results  Component Value Date   WBC 7.0 05/04/2020   HGB 12.1 05/04/2020   HCT 36.5 05/04/2020   MCV 87.7 05/04/2020   PLT 398 05/04/2020     Imaging Studies: No results found.   Assessment:  Chronic GERD: Has been difficult to manage.  No longer on twice daily PPI. Typical heartburn is now well controlled.  She does have some regurgitation but it does  not burn. Biggest complaint is daily nausea for the past 1 month.  Etiology not clear at this time.  Potentially related to side effect from medication or taking medications on an empty stomach.  She is on Amitiza for constipation which could be contributing especially if not taking with food.  Could be typical symptoms of reflux, gastritis or peptic ulcer disease or delayed gastric emptying.  Weight loss: Has stabilized, she has put on some weight.  Encouraged her to continue protein shakes at least once daily.   Plan: Offered patient upper endoscopy versus upper GI series versus gastric emptying study.  She is not interested at this time.  She would like to try changes in medications first. Stop Amitiza, try Trulance 3 mg daily. Increase pantoprazole to 40 mg twice daily 30 minutes before meal. If symptoms do not settle down and she wants to pursue work-up, they will call and let us know.  Otherwise we will see her back in 4 months.

## 2020-11-17 NOTE — Telephone Encounter (Signed)
Phoned to the pt's pharmacy ([facility]) advised of the pt's Rx for Trulance being approved.

## 2020-11-17 NOTE — Telephone Encounter (Signed)
Pt has been approved to get Trulance 3 mg tab. Pt may also get it in a 90 day supply. Pharmacy  has to be called for that to process prescription claim.

## 2020-11-18 DIAGNOSIS — E1142 Type 2 diabetes mellitus with diabetic polyneuropathy: Secondary | ICD-10-CM | POA: Diagnosis not present

## 2020-11-18 DIAGNOSIS — I4891 Unspecified atrial fibrillation: Secondary | ICD-10-CM | POA: Diagnosis not present

## 2020-11-18 DIAGNOSIS — I951 Orthostatic hypotension: Secondary | ICD-10-CM | POA: Diagnosis not present

## 2020-11-18 DIAGNOSIS — I82509 Chronic embolism and thrombosis of unspecified deep veins of unspecified lower extremity: Secondary | ICD-10-CM | POA: Diagnosis not present

## 2020-11-18 DIAGNOSIS — I69354 Hemiplegia and hemiparesis following cerebral infarction affecting left non-dominant side: Secondary | ICD-10-CM | POA: Diagnosis not present

## 2020-11-18 DIAGNOSIS — E1165 Type 2 diabetes mellitus with hyperglycemia: Secondary | ICD-10-CM | POA: Diagnosis not present

## 2020-11-18 NOTE — Telephone Encounter (Signed)
Spoke with Butch Penny at [facility] and advised that we had received documentation yesterday where the pt can do 90 day supply instead of 30 day and for [place] to contact the pt's pharmacy to have this done. She advised they do the 30 day cycle because of how their deliveries are set up. So we just decided to leave it that way. Will give to [facility] for scan.

## 2020-11-19 ENCOUNTER — Ambulatory Visit: Payer: Medicare Other | Admitting: Gastroenterology

## 2020-11-22 DIAGNOSIS — I69354 Hemiplegia and hemiparesis following cerebral infarction affecting left non-dominant side: Secondary | ICD-10-CM | POA: Diagnosis not present

## 2020-11-22 DIAGNOSIS — E1165 Type 2 diabetes mellitus with hyperglycemia: Secondary | ICD-10-CM | POA: Diagnosis not present

## 2020-11-22 DIAGNOSIS — I4891 Unspecified atrial fibrillation: Secondary | ICD-10-CM | POA: Diagnosis not present

## 2020-11-22 DIAGNOSIS — I82509 Chronic embolism and thrombosis of unspecified deep veins of unspecified lower extremity: Secondary | ICD-10-CM | POA: Diagnosis not present

## 2020-11-22 DIAGNOSIS — E1142 Type 2 diabetes mellitus with diabetic polyneuropathy: Secondary | ICD-10-CM | POA: Diagnosis not present

## 2020-11-22 DIAGNOSIS — I951 Orthostatic hypotension: Secondary | ICD-10-CM | POA: Diagnosis not present

## 2020-11-23 DIAGNOSIS — I951 Orthostatic hypotension: Secondary | ICD-10-CM | POA: Diagnosis not present

## 2020-11-23 DIAGNOSIS — I82509 Chronic embolism and thrombosis of unspecified deep veins of unspecified lower extremity: Secondary | ICD-10-CM | POA: Diagnosis not present

## 2020-11-23 DIAGNOSIS — E1165 Type 2 diabetes mellitus with hyperglycemia: Secondary | ICD-10-CM | POA: Diagnosis not present

## 2020-11-23 DIAGNOSIS — I69354 Hemiplegia and hemiparesis following cerebral infarction affecting left non-dominant side: Secondary | ICD-10-CM | POA: Diagnosis not present

## 2020-11-23 DIAGNOSIS — I4891 Unspecified atrial fibrillation: Secondary | ICD-10-CM | POA: Diagnosis not present

## 2020-11-23 DIAGNOSIS — E1142 Type 2 diabetes mellitus with diabetic polyneuropathy: Secondary | ICD-10-CM | POA: Diagnosis not present

## 2020-11-25 ENCOUNTER — Other Ambulatory Visit: Payer: Self-pay | Admitting: Nurse Practitioner

## 2020-11-29 DIAGNOSIS — I69354 Hemiplegia and hemiparesis following cerebral infarction affecting left non-dominant side: Secondary | ICD-10-CM | POA: Diagnosis not present

## 2020-11-29 DIAGNOSIS — I4891 Unspecified atrial fibrillation: Secondary | ICD-10-CM | POA: Diagnosis not present

## 2020-11-29 DIAGNOSIS — I951 Orthostatic hypotension: Secondary | ICD-10-CM | POA: Diagnosis not present

## 2020-11-29 DIAGNOSIS — E1142 Type 2 diabetes mellitus with diabetic polyneuropathy: Secondary | ICD-10-CM | POA: Diagnosis not present

## 2020-11-29 DIAGNOSIS — I82509 Chronic embolism and thrombosis of unspecified deep veins of unspecified lower extremity: Secondary | ICD-10-CM | POA: Diagnosis not present

## 2020-11-29 DIAGNOSIS — E1165 Type 2 diabetes mellitus with hyperglycemia: Secondary | ICD-10-CM | POA: Diagnosis not present

## 2020-12-01 DIAGNOSIS — E1142 Type 2 diabetes mellitus with diabetic polyneuropathy: Secondary | ICD-10-CM | POA: Diagnosis not present

## 2020-12-01 DIAGNOSIS — I951 Orthostatic hypotension: Secondary | ICD-10-CM | POA: Diagnosis not present

## 2020-12-01 DIAGNOSIS — I82509 Chronic embolism and thrombosis of unspecified deep veins of unspecified lower extremity: Secondary | ICD-10-CM | POA: Diagnosis not present

## 2020-12-01 DIAGNOSIS — I69354 Hemiplegia and hemiparesis following cerebral infarction affecting left non-dominant side: Secondary | ICD-10-CM | POA: Diagnosis not present

## 2020-12-01 DIAGNOSIS — E1165 Type 2 diabetes mellitus with hyperglycemia: Secondary | ICD-10-CM | POA: Diagnosis not present

## 2020-12-01 DIAGNOSIS — I4891 Unspecified atrial fibrillation: Secondary | ICD-10-CM | POA: Diagnosis not present

## 2020-12-02 DIAGNOSIS — E041 Nontoxic single thyroid nodule: Secondary | ICD-10-CM | POA: Diagnosis not present

## 2020-12-02 DIAGNOSIS — E1159 Type 2 diabetes mellitus with other circulatory complications: Secondary | ICD-10-CM | POA: Diagnosis not present

## 2020-12-03 LAB — T4, FREE: Free T4: 1.35 ng/dL (ref 0.82–1.77)

## 2020-12-03 LAB — COMPREHENSIVE METABOLIC PANEL
ALT: 9 IU/L (ref 0–32)
AST: 11 IU/L (ref 0–40)
Albumin/Globulin Ratio: 1.6 (ref 1.2–2.2)
Albumin: 4.4 g/dL (ref 3.7–4.7)
Alkaline Phosphatase: 97 IU/L (ref 44–121)
BUN/Creatinine Ratio: 15 (ref 12–28)
BUN: 12 mg/dL (ref 8–27)
Bilirubin Total: 0.3 mg/dL (ref 0.0–1.2)
CO2: 30 mmol/L — ABNORMAL HIGH (ref 20–29)
Calcium: 9.2 mg/dL (ref 8.7–10.3)
Chloride: 100 mmol/L (ref 96–106)
Creatinine, Ser: 0.8 mg/dL (ref 0.57–1.00)
Globulin, Total: 2.7 g/dL (ref 1.5–4.5)
Glucose: 126 mg/dL — ABNORMAL HIGH (ref 70–99)
Potassium: 4.2 mmol/L (ref 3.5–5.2)
Sodium: 142 mmol/L (ref 134–144)
Total Protein: 7.1 g/dL (ref 6.0–8.5)
eGFR: 77 mL/min/{1.73_m2} (ref >59)

## 2020-12-03 LAB — LIPID PANEL
Chol/HDL Ratio: 2.3 ratio (ref 0.0–4.4)
Cholesterol, Total: 107 mg/dL (ref 100–199)
HDL: 46 mg/dL (ref >39)
LDL Chol Calc (NIH): 45 mg/dL (ref 0–99)
Triglycerides: 82 mg/dL (ref 0–149)
VLDL Cholesterol Cal: 16 mg/dL (ref 5–40)

## 2020-12-03 LAB — TSH: TSH: 1.37 u[IU]/mL (ref 0.450–4.500)

## 2020-12-06 DIAGNOSIS — I82509 Chronic embolism and thrombosis of unspecified deep veins of unspecified lower extremity: Secondary | ICD-10-CM | POA: Diagnosis not present

## 2020-12-06 DIAGNOSIS — I951 Orthostatic hypotension: Secondary | ICD-10-CM | POA: Diagnosis not present

## 2020-12-06 DIAGNOSIS — E1142 Type 2 diabetes mellitus with diabetic polyneuropathy: Secondary | ICD-10-CM | POA: Diagnosis not present

## 2020-12-06 DIAGNOSIS — E1165 Type 2 diabetes mellitus with hyperglycemia: Secondary | ICD-10-CM | POA: Diagnosis not present

## 2020-12-06 DIAGNOSIS — I69354 Hemiplegia and hemiparesis following cerebral infarction affecting left non-dominant side: Secondary | ICD-10-CM | POA: Diagnosis not present

## 2020-12-06 DIAGNOSIS — I4891 Unspecified atrial fibrillation: Secondary | ICD-10-CM | POA: Diagnosis not present

## 2020-12-08 DIAGNOSIS — I69354 Hemiplegia and hemiparesis following cerebral infarction affecting left non-dominant side: Secondary | ICD-10-CM | POA: Diagnosis not present

## 2020-12-08 DIAGNOSIS — E1142 Type 2 diabetes mellitus with diabetic polyneuropathy: Secondary | ICD-10-CM | POA: Diagnosis not present

## 2020-12-08 DIAGNOSIS — I951 Orthostatic hypotension: Secondary | ICD-10-CM | POA: Diagnosis not present

## 2020-12-08 DIAGNOSIS — I4891 Unspecified atrial fibrillation: Secondary | ICD-10-CM | POA: Diagnosis not present

## 2020-12-08 DIAGNOSIS — I82509 Chronic embolism and thrombosis of unspecified deep veins of unspecified lower extremity: Secondary | ICD-10-CM | POA: Diagnosis not present

## 2020-12-08 DIAGNOSIS — E1165 Type 2 diabetes mellitus with hyperglycemia: Secondary | ICD-10-CM | POA: Diagnosis not present

## 2020-12-09 ENCOUNTER — Encounter: Payer: Self-pay | Admitting: Nurse Practitioner

## 2020-12-09 ENCOUNTER — Other Ambulatory Visit: Payer: Self-pay

## 2020-12-09 ENCOUNTER — Ambulatory Visit (INDEPENDENT_AMBULATORY_CARE_PROVIDER_SITE_OTHER): Payer: Medicare Other | Admitting: Nurse Practitioner

## 2020-12-09 VITALS — BP 128/70 | HR 64

## 2020-12-09 DIAGNOSIS — E041 Nontoxic single thyroid nodule: Secondary | ICD-10-CM | POA: Diagnosis not present

## 2020-12-09 DIAGNOSIS — E782 Mixed hyperlipidemia: Secondary | ICD-10-CM

## 2020-12-09 DIAGNOSIS — E1159 Type 2 diabetes mellitus with other circulatory complications: Secondary | ICD-10-CM | POA: Diagnosis not present

## 2020-12-09 DIAGNOSIS — I10 Essential (primary) hypertension: Secondary | ICD-10-CM

## 2020-12-09 LAB — POCT GLYCOSYLATED HEMOGLOBIN (HGB A1C): HbA1c, POC (controlled diabetic range): 7.5 % — AB (ref 0.0–7.0)

## 2020-12-09 MED ORDER — NOVOLOG FLEXPEN 100 UNIT/ML ~~LOC~~ SOPN
10.0000 [IU] | PEN_INJECTOR | Freq: Three times a day (TID) | SUBCUTANEOUS | 0 refills | Status: DC
Start: 2020-12-09 — End: 2021-02-28

## 2020-12-09 NOTE — Patient Instructions (Signed)

## 2020-12-09 NOTE — Progress Notes (Signed)
12/09/2020  Endocrinology follow-up note  -She was assisted by her caregiver from nursing home.     Subjective:    Patient ID: [name], female    DOB: 1945-08-05,    Past Medical History:  Diagnosis Date   Anxiety    Arthritis    Atrial fibrillation ([place])    Collagen vascular disease ([place])    COVID-19 virus infection    COVID-19+ approx 01/24/19; asymptomatic course with full recovery   Dependence on wheelchair    pivot/transfers   Depression    History of psychosis and previous suicide attempt   DVT, lower extremity, recurrent ([place])    Long-term Coumadin per Dr. Legrand Rams   Essential hypertension    GERD (gastroesophageal reflux disease)    Hemiplegia ([place]) 2010   Left side   History of stroke    Acute infarct and right cerebral white matter small vessel disease 12/10   Leg DVT (deep venous thromboembolism), acute ([place]) 2006   Schizophrenia ([place])    Stroke ([place])    left sided weakness   Type 2 diabetes mellitus ([place])    Past Surgical History:  Procedure Laterality Date   BACK SURGERY     BIOPSY N/A 11/24/2014   Procedure: BIOPSY;  Surgeon: Danie Binder, MD;  Location: [facility];  Service: Endoscopy;  Laterality: N/A;   BIOPSY  05/17/2016   Procedure: BIOPSY;  Surgeon: Danie Binder, MD;  Location: [facility];  Service: Endoscopy;;  gastric biopsy   COLONOSCOPY WITH PROPOFOL N/A 11/24/2014   Dr. Rudie Meyer polyps removed/moderate sized internal hemorrhoids, tubular adenomas. Next surveillance in 3 years   ESOPHAGOGASTRODUODENOSCOPY (EGD) WITH PROPOFOL N/A 11/24/2014   Dr. Clayburn Pert HH/patent stricture at the gastroesophageal junction, mild non-erosive gastritis, path negative for H.pylori or celiac sprue   ESOPHAGOGASTRODUODENOSCOPY (EGD) WITH PROPOFOL N/A 05/17/2016   Procedure: ESOPHAGOGASTRODUODENOSCOPY (EGD) WITH PROPOFOL;  Surgeon: Danie Binder, MD;  Location: [facility];  Service: Endoscopy;  Laterality: N/A;  12:45pm   GIVENS CAPSULE STUDY N/A 12/11/2014    MULTILPLE EROSION IN the stomach WITH ACTIVE OOZING. OCCASIONAL EROSIONS AND RARE ULCER SEEN IN PROXIMAL SMALL BOWEL . No masses or AVMs SEEN. NO OLD BLOOD OR FRESH BLOOD SEEN.    POLYPECTOMY N/A 11/24/2014   Procedure: POLYPECTOMY;  Surgeon: Danie Binder, MD;  Location: [facility];  Service: Endoscopy;  Laterality: N/A;   SAVORY DILATION N/A 05/17/2016   Procedure: SAVORY DILATION;  Surgeon: Danie Binder, MD;  Location[facility];  Service: Endoscopy;  Laterality: N/A;   Family History  Problem Relation Age of Onset   Hypertension Mother    Colon cancer Neg Hx     Social History   Socioeconomic History   Marital status: Widowed    Spouse name: Not on file   Number of children: Not on file   Years of education: Not on file   Highest education level: Not on file  Occupational History   Not on file  Tobacco Use   Smoking status: Former    Packs/day: 0.25    Years: 20.00    Pack years: 5.00    Types: Cigarettes    Quit date: 01/24/1995    Years since quitting: 25.8   Smokeless tobacco: Never  Vaping Use   Vaping Use: Never used  Substance and Sexual Activity   Alcohol use: No    Alcohol/week: 0.0 standard drinks   Drug use: No   Sexual activity: Never    Birth control/protection:  Post-menopausal  Other Topics Concern   Not on file  Social History Narrative   Not on file   Social Determinants of Health   Financial Resource Strain: Not on file  Food Insecurity: Not on file  Transportation Needs: Not on file  Physical Activity: Not on file  Stress: Not on file  Social Connections: Not on file   Outpatient Encounter Medications as of 12/09/2020  Medication Sig   ABILIFY 2 MG tablet Take 1 tablet (2 mg total) by mouth daily.   acetaminophen (TYLENOL) 325 MG tablet Take 650 mg by mouth 3 (three) times daily as needed for mild pain or moderate pain.    ANTI-DIARRHEAL 2 MG tablet Take by mouth.   apixaban (ELIQUIS) 5 MG TABS tablet Take 1 tablet (5 mg total) by mouth  2 (two) times daily.   atorvastatin (LIPITOR) 40 MG tablet Take 1 tablet by mouth daily as needed.   calcium carbonate (TUMS - DOSED IN MG ELEMENTAL CALCIUM) 500 MG chewable tablet Chew 1 tablet by mouth 4 (four) times daily as needed for heartburn.   Carboxymethylcellulose Sodium (ARTIFICIAL TEARS OP) Place 1 drop into both eyes 3 (three) times daily.   Cholecalciferol (VITAMIN D3) 50 MCG (2000 UT) TABS Take 2,000 Units by mouth daily.   clonazePAM (KLONOPIN) 0.5 MG tablet Take 1 tablet (0.5 mg total) by mouth 3 (three) times daily as needed for anxiety. (Patient taking differently: Take 0.5 mg by mouth 3 (three) times daily.)   diltiazem (CARDIZEM CD) 120 MG 24 hr capsule Take 120 mg by mouth daily.   DULoxetine (CYMBALTA) 60 MG capsule Take 60 mg by mouth daily.   gabapentin (NEURONTIN) 300 MG capsule Take 1 capsule (300 mg total) by mouth 3 (three) times daily as needed. (Patient taking differently: Take 300 mg by mouth 3 (three) times daily.)   glucose blood (EASYMAX TEST) test strip Use to check blood glucose 4 times daily, before meals and before bed, call the clinic if she has readings < 70 or > 300 for 3 tests in a row   GOODSENSE ARTIFICIAL TEARS 0.5-0.6 % SOLN Apply to eye.   HYDROcodone-acetaminophen (NORCO) 10-325 MG tablet Take 1 tablet by mouth 3 (three) times daily as needed for severe pain. (Patient taking differently: Take 1 tablet by mouth 3 (three) times daily.)   hydrOXYzine (ATARAX/VISTARIL) 25 MG tablet Take 1 tablet (25 mg total) by mouth every 8 (eight) hours as needed for anxiety or itching. (Patient taking differently: Take 25 mg by mouth 3 (three) times daily.)   insulin detemir (LEVEMIR FLEXTOUCH) 100 UNIT/ML FlexPen Inject 55 Units into the skin at bedtime.   isopropyl alcohol 70 % SOLN    Lancets 28G MISC USE TO CHECK BLOOD SUGAR TWICE DAILY.   latanoprost (XALATAN) 0.005 % ophthalmic solution Place 1 drop into both eyes at bedtime.   loratadine (CLARITIN) 10 MG tablet  Take 10 mg by mouth daily.   metoprolol succinate (TOPROL-XL) 25 MG 24 hr tablet Take 3 tablets (75 mg total) by mouth 2 (two) times daily.   midodrine (PROAMATINE) 10 MG tablet Take 10 mg by mouth 3 (three) times daily.   mirabegron ER (MYRBETRIQ) 25 MG TB24 tablet Take 25 mg by mouth daily.   mirtazapine (REMERON) 30 MG tablet Take 30 mg by mouth at bedtime.   NOVOFINE AUTOCOVER PEN NEEDLE 30G X 8 MM MISC    Nutritional Supplements (ENSURE COMPLETE PO) Take by mouth daily.   ondansetron (ZOFRAN) 4 MG tablet  Take 1 tablet (4 mg total) by mouth 2 (two) times daily as needed for nausea or vomiting. (Patient taking differently: Take 4 mg by mouth 4 (four) times daily -  with meals and at bedtime.)   pantoprazole (PROTONIX) 40 MG tablet Take 1 tablet (40 mg total) by mouth 2 (two) times daily before a meal.   Plecanatide (TRULANCE) 3 MG TABS Take 3 mg by mouth daily.   polyethylene glycol (MIRALAX / GLYCOLAX) 17 g packet Take 17 g by mouth daily.   risperiDONE (RISPERDAL) 1 MG tablet Take 1 tablet (1 mg total) by mouth in the morning and at bedtime.   senna (SENOKOT) 8.6 MG TABS tablet Take 1 tablet (8.6 mg total) by mouth daily.   TRADJENTA 5 MG TABS tablet TAKE (1) TABLET BY MOUTH ONCE DAILY.   zolpidem (AMBIEN) 5 MG tablet Take 5 mg by mouth at bedtime as needed.   [DISCONTINUED] NOVOLOG FLEXPEN 100 UNIT/ML FlexPen INJECT 10 UNITS SUBCUTANEOUSLY 3 TIMES A DAY.(IF GLUCOSE IS ABOVE 90 AND SHE IS EATING.)(HOLD IF BS<70: CALL MD IF BS> 400)   insulin aspart (NOVOLOG FLEXPEN) 100 UNIT/ML FlexPen Inject 10-16 Units into the skin 3 (three) times daily with meals.   [DISCONTINUED] insulin lispro (HUMALOG KWIKPEN) 100 UNIT/ML KwikPen Inject 10 Units into the skin 3 (three) times daily. If glucose is above 90 and she is eating. (Patient not taking: Reported on 11/17/2020)   No facility-administered encounter medications on file as of 12/09/2020.   ALLERGIES: Allergies  Allergen Reactions   Sulfa  Antibiotics Rash   VACCINATION STATUS: There is no immunization history for the selected administration types on file for this patient.  Diabetes She presents for her follow-up diabetic visit. She has type 2 diabetes mellitus. Onset time: She was diagnosed at approximate age of 25 years. Her disease course has been improving. There are no hypoglycemic associated symptoms. Pertinent negatives for hypoglycemia include no confusion, pallor or seizures. Associated symptoms include fatigue. Pertinent negatives for diabetes include no polydipsia, no polyphagia, no polyuria and no weight loss. There are no hypoglycemic complications. Symptoms are improving. Diabetic complications include a CVA, heart disease and nephropathy. Risk factors for coronary artery disease include dyslipidemia, diabetes mellitus, obesity, sedentary lifestyle, hypertension and post-menopausal. Current diabetic treatment includes oral agent (monotherapy) and intensive insulin program. She is compliant with treatment all of the time. Her weight is stable. She is following a generally unhealthy diet. When asked about meal planning, she reported none. She has not had a previous visit with a dietitian. She never participates in exercise. Her home blood glucose trend is decreasing steadily. Her breakfast blood glucose range is generally 130-140 mg/dl. Her lunch blood glucose range is generally 180-200 mg/dl. Her dinner blood glucose range is generally 180-200 mg/dl. Her bedtime blood glucose range is generally >200 mg/dl. (She presents today, accompanied by her care attendant from the nursing home, with her logs showing much improved glycemic profile overall.  Her POCT A1c today is 7.5%, improving from last visit of 10.5%.  This is an appropriate target for her.  She does not have any hypoglycemia documented.  She still has fluctuations in her glucose readings, likely due to limited meal choices.) An ACE inhibitor/angiotensin II receptor blocker is  not being taken. She does not see a podiatrist.Eye exam is current.  Hypertension This is a chronic problem. The current episode started more than 1 year ago. The problem has been gradually improving since onset. The problem is controlled. Pertinent negatives include  no palpitations or shortness of breath. There are no associated agents to hypertension. Risk factors for coronary artery disease include dyslipidemia, diabetes mellitus, sedentary lifestyle and post-menopausal state. Past treatments include beta blockers. The current treatment provides mild improvement. There are no compliance problems.  Hypertensive end-organ damage includes kidney disease and CVA. Identifiable causes of hypertension include chronic renal disease and a thyroid problem.  Hyperlipidemia This is a chronic problem. The current episode started more than 1 year ago. The problem is controlled. Recent lipid tests were reviewed and are normal. Exacerbating diseases include chronic renal disease and diabetes. Factors aggravating her hyperlipidemia include beta blockers. Pertinent negatives include no shortness of breath. Current antihyperlipidemic treatment includes statins. The current treatment provides moderate improvement of lipids. There are no compliance problems.  Risk factors for coronary artery disease include diabetes mellitus, dyslipidemia, hypertension, obesity and a sedentary lifestyle.  Thyroid Problem Presents for follow-up visit. Symptoms include fatigue. Patient reports no palpitations or weight loss. The symptoms have been stable. Past treatments include beta blockers (on beta blockers for other comorbidities). Prior procedures include radioiodine uptake scan. Her past medical history is significant for atrial fibrillation, diabetes and hyperlipidemia. Risk factors include family history of hyperthyroidism.    Review of systems  Constitutional: + Minimally fluctuating body weight,  current There is no height or  weight on file to calculate BMI. , no fatigue, no subjective hyperthermia, no subjective hypothermia Eyes: no blurry vision, no xerophthalmia ENT: no sore throat, no nodules palpated in throat, no dysphagia/odynophagia, no hoarseness Cardiovascular: no chest pain, no shortness of breath, no palpitations, no leg swelling Respiratory: no cough, no shortness of breath Gastrointestinal: no nausea/vomiting/diarrhea Musculoskeletal: no muscle/joint aches, essentially WC bound Skin: no rashes, no hyperemia Neurological: no tremors, no numbness, no tingling, no dizziness Psychiatric: no depression, no anxiety          Objective:    BP 128/70   Pulse 64   SpO2 96%   Wt Readings from Last 3 Encounters:  11/17/20 131 lb (59.4 kg)  07/14/20 119 lb (54 kg)  05/11/20 122 lb (55.3 kg)     BP Readings from Last 3 Encounters:  12/09/20 128/70  11/17/20 (!) 114/56  10/06/20 (!) 152/64    Physical Exam- Limited  Constitutional:  There is no height or weight on file to calculate BMI., not in acute distress, normal state of mind Eyes:  EOMI, no exophthalmos Neck: Supple Cardiovascular: RRR, + murmur, rubs, or gallops, no edema Respiratory: Adequate breathing efforts, no crackles, rales, rhonchi, or wheezing Musculoskeletal: WC bound due to previous CVA Skin:  no rashes, no hyperemia Neurological: no tremor     CMP     Component Value Date/Time   NA 142 12/02/2020 0858   K 4.2 12/02/2020 0858   CL 100 12/02/2020 0858   CO2 30 (H) 12/02/2020 0858   GLUCOSE 126 (H) 12/02/2020 0858   GLUCOSE 128 (H) 05/04/2020 0925   BUN 12 12/02/2020 0858   CREATININE 0.80 12/02/2020 0858   CREATININE 0.69 05/04/2020 0925   CALCIUM 9.2 12/02/2020 0858   PROT 7.1 12/02/2020 0858   ALBUMIN 4.4 12/02/2020 0858   AST 11 12/02/2020 0858   ALT 9 12/02/2020 0858   ALKPHOS 97 12/02/2020 0858   BILITOT 0.3 12/02/2020 0858   GFRNONAA >60 04/25/2020 0618   GFRNONAA 91 06/27/2018 0803   GFRAA >60  10/05/2019 0457   GFRAA 106 06/27/2018 0803     CBC    Component Value Date/Time  WBC 7.0 05/04/2020 0930   RBC 4.16 05/04/2020 0930   HGB 12.1 05/04/2020 0930   HCT 36.5 05/04/2020 0930   PLT 398 05/04/2020 0930   MCV 87.7 05/04/2020 0930   MCH 29.1 05/04/2020 0930   MCHC 33.2 05/04/2020 0930   RDW 13.1 05/04/2020 0930   LYMPHSABS 1.1 04/18/2020 1105   MONOABS 0.6 04/18/2020 1105   EOSABS 0.0 04/18/2020 1105   BASOSABS 0.0 04/18/2020 1105     Diabetic Labs (most recent): Lab Results  Component Value Date   HGBA1C 7.5 (A) 12/09/2020   HGBA1C 10.5 (A) 09/08/2020   HGBA1C 8.2 (A) 06/08/2020    Lipid Panel     Component Value Date/Time   CHOL 107 12/02/2020 0858   TRIG 82 12/02/2020 0858   HDL 46 12/02/2020 0858   CHOLHDL 2.3 12/02/2020 0858   CHOLHDL 3.8 10/05/2019 0457   VLDL 30 10/05/2019 0457   LDLCALC 45 12/02/2020 0858   LABVLDL 16 12/02/2020 0858     Lab Results  Component Value Date   TSH 1.370 12/02/2020   TSH 0.869 06/03/2020   TSH 0.293 (L) 04/18/2020   TSH 0.578 10/05/2019   TSH 1.138 03/29/2017   TSH 0.538 02/13/2016   FREET4 1.35 12/02/2020   FREET4 1.13 06/03/2020   FREET4 1.52 (H) 04/19/2020     Thyroid US from 09/01/20 CLINICAL DATA:  Hyperthyroidism, right thyroid cold nodule by nuclear medicine thyroid scan   EXAM: THYROID ULTRASOUND   TECHNIQUE: Ultrasound examination of the thyroid gland and adjacent soft tissues was performed.   COMPARISON:  06/02/2020 nuclear medicine thyroid scan   FINDINGS: Parenchymal Echotexture: Moderately heterogenous   Isthmus: 5 mm   Right lobe: 3.6 x 2.3 x 2.3 cm   Left lobe: 3.7 x 1.8 x 1.4 cm   _________________________________________________________   Estimated total number of nodules >/= 1 cm: 2   Number of spongiform nodules >/=  2 cm not described below (TR1): 0   Number of mixed cystic and solid nodules >/= 1.5 cm not described below (TR2): 0    _________________________________________________________   Nodule # 1:   Location: Right; Mid   Maximum size: 2.1 cm; Other 2 dimensions: 1.7 x 1.6 cm   Composition: solid/almost completely solid (2)   Echogenicity: isoechoic (1)   Shape: not taller-than-wide (0)   Margins: ill-defined (0)   Echogenic foci: punctate echogenic foci (3)   ACR TI-RADS total points: 6.   ACR TI-RADS risk category: TR4 (4-6 points).   ACR TI-RADS recommendations:   **Given size (>/= 1.5 cm) and appearance, fine needle aspiration of this moderately suspicious nodule should be considered based on TI-RADS criteria.   _________________________________________________________   Nodule # 2:   Location: Left; Mid   Maximum size: 1.4 cm; Other 2 dimensions: 1.3 x 1.3 cm   Composition: mixed cystic and solid (1)   Echogenicity: hypoechoic (2)   Shape: not taller-than-wide (0)   Margins: ill-defined (0)   Echogenic foci: none (0)   ACR TI-RADS total points: 3.   ACR TI-RADS risk category: TR3 (3 points).   ACR TI-RADS recommendations:   Given size (<1.4 cm) and appearance, this nodule does NOT meet TI-RADS criteria for biopsy or dedicated follow-up.   _________________________________________________________   Nonspecific thyroid heterogeneity. No hypervascularity. No regional adenopathy.   IMPRESSION: 2.1 cm right mid thyroid TR 4 nodule meets criteria for biopsy as above. This correlates with the nuclear medicine cold nodule.   The above is in keeping with the  ACR TI-RADS recommendations - J Am Coll Radiol 2017;14:587-595.     Electronically Signed   By: Jerilynn Mages.  Shick M.D.   On: 09/01/2020 11:26   Assessment & Plan:   1) Thyroid Nodule  she is being seen at a kind request of [name], MD.  During evaluation for hyperthyroidism, her uptake and scan was identified a cold nodule in right mid thyroid, recommending dedicated thyroid ultrasound to assess completely.   She recently had her thyroid ultrasound which recommended FNA of the moderately suspicious nodule.    The biopsy results were sent to [facility] for further evaluation and determined to be benign.  No further follow up needed at this time.  2) Type 2 diabetes mellitus with vascular disease ([place])   Her diabetes is complicated by recurrent CVA.   She presents today, accompanied by her care attendant from the nursing home, with her logs showing much improved glycemic profile overall.  Her POCT A1c today is 7.5%, improving from last visit of 10.5%.  This is an appropriate target for her.  She does not have any hypoglycemia documented.  She still has fluctuations in her glucose readings, likely due to limited meal choices.   - Patient remains at a high risk for more acute and chronic complications of diabetes which include CAD, CVA, CKD, retinopathy, and neuropathy. These are all discussed in detail with the patient.   - Nutritional counseling repeated at each appointment due to patients tendency to fall back in to old habits.  - The patient admits there is a room for improvement in their diet and drink choices. -  Suggestion is made for the patient to avoid simple carbohydrates from their diet including Cakes, Sweet Desserts / Pastries, Ice Cream, Soda (diet and regular), Sweet Tea, Candies, Chips, Cookies, Sweet Pastries, Store Bought Juices, Alcohol in Excess of 1-2 drinks a day, Artificial Sweeteners, Coffee Creamer, and "Sugar-free" Products. This will help patient to have stable blood glucose profile and potentially avoid unintended weight gain.   - I encouraged the patient to switch to unprocessed or minimally processed complex starch and increased protein intake (animal or plant source), fruits, and vegetables.   - Patient is advised to stick to a routine mealtimes to eat 3 meals a day and avoid unnecessary snacks (to snack only to correct hypoglycemia).   - I have approached patient with the  following individualized plan to manage diabetes and patient agrees.   -She is advised to continue Levemir 55 units SQ nightly and will adjust Humalog to 10-16 units TID with meals if glucose is above 90 and she is eating (Specific instructions on how to titrate insulin dosage based on glucose readings given to patient in writing).   She can continue her Tradjenta 5 mg po daily.     -She is encouraged to continue monitoring blood glucose 4 times daily, before meals and before bed, and to call the clinic if she has readings less than 70 or above 300 for 3 tests in a row.    - she is not a suitable candidate for metformin nor SGLT2 inhibitor therapy.     - Patient specific target  for A1c; LDL, HDL, Triglycerides, and  Waist Circumference were discussed in detail.  -Patient is advised to maintain close follow up with [name], MD for primary care needs.      I spent 40 minutes in the care of the patient today including review of labs from CMP, Lipids, Thyroid Function, Hematology (current and  previous including abstractions from other facilities); face-to-face time discussing  her blood glucose readings/logs, discussing hypoglycemia and hyperglycemia episodes and symptoms, medications doses, her options of short and long term treatment based on the latest standards of care / guidelines;  discussion about incorporating lifestyle medicine;  and documenting the encounter.    Please refer to Patient Instructions for Blood Glucose Monitoring and Insulin/Medications Dosing Guide"  in media tab for additional information. Please  also refer to " Patient Self Inventory" in the Media  tab for reviewed elements of pertinent patient history.  [name] participated in the discussions, expressed understanding, and voiced agreement with the above plans.  All questions were answered to her satisfaction. she is encouraged to contact clinic should she have any questions or concerns prior to her return  visit.    Follow up plan: Return in about 4 months (around 04/08/2021) for Diabetes F/U with A1c in office, No previsit labs, Bring meter and logs.    Rayetta Pigg, [facility] [address] [place], [place] [address] Phone: [phone] Fax: [phone]  12/09/2020, 12:17 PM

## 2020-12-10 DIAGNOSIS — I10 Essential (primary) hypertension: Secondary | ICD-10-CM | POA: Diagnosis not present

## 2020-12-10 DIAGNOSIS — E1142 Type 2 diabetes mellitus with diabetic polyneuropathy: Secondary | ICD-10-CM | POA: Diagnosis not present

## 2020-12-11 DIAGNOSIS — H409 Unspecified glaucoma: Secondary | ICD-10-CM | POA: Diagnosis not present

## 2020-12-11 DIAGNOSIS — K5900 Constipation, unspecified: Secondary | ICD-10-CM | POA: Diagnosis not present

## 2020-12-11 DIAGNOSIS — I951 Orthostatic hypotension: Secondary | ICD-10-CM | POA: Diagnosis not present

## 2020-12-11 DIAGNOSIS — G894 Chronic pain syndrome: Secondary | ICD-10-CM | POA: Diagnosis not present

## 2020-12-11 DIAGNOSIS — R569 Unspecified convulsions: Secondary | ICD-10-CM | POA: Diagnosis not present

## 2020-12-11 DIAGNOSIS — E1142 Type 2 diabetes mellitus with diabetic polyneuropathy: Secondary | ICD-10-CM | POA: Diagnosis not present

## 2020-12-11 DIAGNOSIS — F323 Major depressive disorder, single episode, severe with psychotic features: Secondary | ICD-10-CM | POA: Diagnosis not present

## 2020-12-11 DIAGNOSIS — I82509 Chronic embolism and thrombosis of unspecified deep veins of unspecified lower extremity: Secondary | ICD-10-CM | POA: Diagnosis not present

## 2020-12-11 DIAGNOSIS — Z794 Long term (current) use of insulin: Secondary | ICD-10-CM | POA: Diagnosis not present

## 2020-12-11 DIAGNOSIS — F209 Schizophrenia, unspecified: Secondary | ICD-10-CM | POA: Diagnosis not present

## 2020-12-11 DIAGNOSIS — E1165 Type 2 diabetes mellitus with hyperglycemia: Secondary | ICD-10-CM | POA: Diagnosis not present

## 2020-12-11 DIAGNOSIS — Z7984 Long term (current) use of oral hypoglycemic drugs: Secondary | ICD-10-CM | POA: Diagnosis not present

## 2020-12-11 DIAGNOSIS — E785 Hyperlipidemia, unspecified: Secondary | ICD-10-CM | POA: Diagnosis not present

## 2020-12-11 DIAGNOSIS — F418 Other specified anxiety disorders: Secondary | ICD-10-CM | POA: Diagnosis not present

## 2020-12-11 DIAGNOSIS — I10 Essential (primary) hypertension: Secondary | ICD-10-CM | POA: Diagnosis not present

## 2020-12-11 DIAGNOSIS — I69354 Hemiplegia and hemiparesis following cerebral infarction affecting left non-dominant side: Secondary | ICD-10-CM | POA: Diagnosis not present

## 2020-12-11 DIAGNOSIS — K219 Gastro-esophageal reflux disease without esophagitis: Secondary | ICD-10-CM | POA: Diagnosis not present

## 2020-12-11 DIAGNOSIS — Z7901 Long term (current) use of anticoagulants: Secondary | ICD-10-CM | POA: Diagnosis not present

## 2020-12-11 DIAGNOSIS — M159 Polyosteoarthritis, unspecified: Secondary | ICD-10-CM | POA: Diagnosis not present

## 2020-12-11 DIAGNOSIS — I4891 Unspecified atrial fibrillation: Secondary | ICD-10-CM | POA: Diagnosis not present

## 2020-12-21 DIAGNOSIS — I69354 Hemiplegia and hemiparesis following cerebral infarction affecting left non-dominant side: Secondary | ICD-10-CM | POA: Diagnosis not present

## 2020-12-21 DIAGNOSIS — I82509 Chronic embolism and thrombosis of unspecified deep veins of unspecified lower extremity: Secondary | ICD-10-CM | POA: Diagnosis not present

## 2020-12-21 DIAGNOSIS — Z0001 Encounter for general adult medical examination with abnormal findings: Secondary | ICD-10-CM | POA: Diagnosis not present

## 2020-12-21 DIAGNOSIS — E1165 Type 2 diabetes mellitus with hyperglycemia: Secondary | ICD-10-CM | POA: Diagnosis not present

## 2020-12-21 DIAGNOSIS — I4891 Unspecified atrial fibrillation: Secondary | ICD-10-CM | POA: Diagnosis not present

## 2020-12-21 DIAGNOSIS — E1142 Type 2 diabetes mellitus with diabetic polyneuropathy: Secondary | ICD-10-CM | POA: Diagnosis not present

## 2020-12-21 DIAGNOSIS — I10 Essential (primary) hypertension: Secondary | ICD-10-CM | POA: Diagnosis not present

## 2020-12-21 DIAGNOSIS — I951 Orthostatic hypotension: Secondary | ICD-10-CM | POA: Diagnosis not present

## 2020-12-23 DIAGNOSIS — I951 Orthostatic hypotension: Secondary | ICD-10-CM | POA: Diagnosis not present

## 2020-12-23 DIAGNOSIS — I4891 Unspecified atrial fibrillation: Secondary | ICD-10-CM | POA: Diagnosis not present

## 2020-12-23 DIAGNOSIS — E1165 Type 2 diabetes mellitus with hyperglycemia: Secondary | ICD-10-CM | POA: Diagnosis not present

## 2020-12-23 DIAGNOSIS — I69354 Hemiplegia and hemiparesis following cerebral infarction affecting left non-dominant side: Secondary | ICD-10-CM | POA: Diagnosis not present

## 2020-12-23 DIAGNOSIS — E1142 Type 2 diabetes mellitus with diabetic polyneuropathy: Secondary | ICD-10-CM | POA: Diagnosis not present

## 2020-12-23 DIAGNOSIS — I82509 Chronic embolism and thrombosis of unspecified deep veins of unspecified lower extremity: Secondary | ICD-10-CM | POA: Diagnosis not present

## 2020-12-27 DIAGNOSIS — I951 Orthostatic hypotension: Secondary | ICD-10-CM | POA: Diagnosis not present

## 2020-12-27 DIAGNOSIS — F333 Major depressive disorder, recurrent, severe with psychotic symptoms: Secondary | ICD-10-CM | POA: Diagnosis not present

## 2020-12-27 DIAGNOSIS — I82509 Chronic embolism and thrombosis of unspecified deep veins of unspecified lower extremity: Secondary | ICD-10-CM | POA: Diagnosis not present

## 2020-12-27 DIAGNOSIS — Z1389 Encounter for screening for other disorder: Secondary | ICD-10-CM | POA: Diagnosis not present

## 2020-12-27 DIAGNOSIS — E1165 Type 2 diabetes mellitus with hyperglycemia: Secondary | ICD-10-CM | POA: Diagnosis not present

## 2020-12-27 DIAGNOSIS — Z1331 Encounter for screening for depression: Secondary | ICD-10-CM | POA: Diagnosis not present

## 2020-12-27 DIAGNOSIS — I69354 Hemiplegia and hemiparesis following cerebral infarction affecting left non-dominant side: Secondary | ICD-10-CM | POA: Diagnosis not present

## 2020-12-27 DIAGNOSIS — E1142 Type 2 diabetes mellitus with diabetic polyneuropathy: Secondary | ICD-10-CM | POA: Diagnosis not present

## 2020-12-27 DIAGNOSIS — I4891 Unspecified atrial fibrillation: Secondary | ICD-10-CM | POA: Diagnosis not present

## 2020-12-27 DIAGNOSIS — I825Y9 Chronic embolism and thrombosis of unspecified deep veins of unspecified proximal lower extremity: Secondary | ICD-10-CM | POA: Diagnosis not present

## 2020-12-27 DIAGNOSIS — I10 Essential (primary) hypertension: Secondary | ICD-10-CM | POA: Diagnosis not present

## 2020-12-29 DIAGNOSIS — E1142 Type 2 diabetes mellitus with diabetic polyneuropathy: Secondary | ICD-10-CM | POA: Diagnosis not present

## 2020-12-29 DIAGNOSIS — I69354 Hemiplegia and hemiparesis following cerebral infarction affecting left non-dominant side: Secondary | ICD-10-CM | POA: Diagnosis not present

## 2020-12-29 DIAGNOSIS — I82509 Chronic embolism and thrombosis of unspecified deep veins of unspecified lower extremity: Secondary | ICD-10-CM | POA: Diagnosis not present

## 2020-12-29 DIAGNOSIS — E1165 Type 2 diabetes mellitus with hyperglycemia: Secondary | ICD-10-CM | POA: Diagnosis not present

## 2020-12-29 DIAGNOSIS — I951 Orthostatic hypotension: Secondary | ICD-10-CM | POA: Diagnosis not present

## 2020-12-29 DIAGNOSIS — I4891 Unspecified atrial fibrillation: Secondary | ICD-10-CM | POA: Diagnosis not present

## 2020-12-30 NOTE — Progress Notes (Signed)
Virtual Visit via Video Note  I connected with [name] on 01/06/21 at 11:00 AM EST by a video enabled telemedicine application and verified that I am speaking with the correct person using two identifiers.  Location: Patient: group home Provider: office Persons participated in the visit- patient, provider    I discussed the limitations of evaluation and management by telemedicine and the availability of in person appointments. The patient expressed understanding and agreed to proceed.    I discussed the assessment and treatment plan with the patient. The patient was provided an opportunity to ask questions and all were answered. The patient agreed with the plan and demonstrated an understanding of the instructions.   The patient was advised to call back or seek an in-person evaluation if the symptoms worsen or if the condition fails to improve as anticipated.  I provided 11 minutes of non-face-to-face time during this encounter.   Courtney Clay, MD     [facility] MD/PA/NP OP Progress Note  01/06/2021 11:22 AM [name]  MRN:  [id number]  Chief Complaint:  Chief Complaint   Follow-up; Schizophrenia    HPI:  This is a follow-up appointment for schizophrenia and depression.  She states that she is not doing well.  She feels nervous especially when she has a leg pain.  She tends to stay in the room.  However, on further elaboration, she enjoys crossword during the day at times.  She had a good Thanksgiving, spending time with her daughter and her granddaughter.  She sleeps well.  She has fair appetite.  She feels down at times.  She denies SI.  She denies AH, VH, paranoia.   The staff presents to the interview.  She tends to stay in the room.  She has a female friend, and does chat at times.  Although she tends to be irritable at times when she is unable to take her medication at certain time, she is redirectable, and there is no behavior concerns otherwise.  The staff is trying  to let her use a walker and be a little more active.  She does not have a good appetite, although there is no change in weight.  She drinks Ensure 3 times a day.  The staff denies concern at this time.    Employment: unemployed. used to work for [employer], on disability around ten years ago after her back surgery Marital status: Widow, her husband deceased after 34 years of marriage in 2010 Number of children: 64, 17 year old daughter. She has a granddaughter Sees endocrinologist for diabetes, hyperlipidemia. Last seen in Nov 2022   Visit Diagnosis:    ICD-10-CM   1. Mild episode of recurrent major depressive disorder ([place])  F33.0     2. Schizophrenia, unspecified type ([place])  F20.9 risperiDONE (RISPERDAL) 1 MG tablet      Past Psychiatric History: Please see initial evaluation for full details. I have reviewed the history. No updates at this time.     Past Medical History:  Past Medical History:  Diagnosis Date   Anxiety    Arthritis    Atrial fibrillation (HCC)    Collagen vascular disease ([place])    COVID-19 virus infection    COVID-19+ approx 01/24/19; asymptomatic course with full recovery   Dependence on wheelchair    pivot/transfers   Depression    History of psychosis and previous suicide attempt   DVT, lower extremity, recurrent ([place])    Long-term Coumadin per Dr. Legrand Rams   Essential  hypertension    GERD (gastroesophageal reflux disease)    Hemiplegia ([place]) 2010   Left side   History of stroke    Acute infarct and right cerebral white matter small vessel disease 12/10   Leg DVT (deep venous thromboembolism), acute ([place]) 2006   Schizophrenia ([place])    Stroke ([place])    left sided weakness   Type 2 diabetes mellitus ([place])     Past Surgical History:  Procedure Laterality Date   BACK SURGERY     BIOPSY N/A 11/24/2014   Procedure: BIOPSY;  Surgeon: Danie Binder, MD;  Location: [facility];  Service: Endoscopy;  Laterality: N/A;   BIOPSY  05/17/2016   Procedure: BIOPSY;   Surgeon: Danie Binder, MD;  Location: [facility];  Service: Endoscopy;;  gastric biopsy   COLONOSCOPY WITH PROPOFOL N/A 11/24/2014   Dr. Rudie Meyer polyps removed/moderate sized internal hemorrhoids, tubular adenomas. Next surveillance in 3 years   ESOPHAGOGASTRODUODENOSCOPY (EGD) WITH PROPOFOL N/A 11/24/2014   Dr. Clayburn Pert HH/patent stricture at the gastroesophageal junction, mild non-erosive gastritis, path negative for H.pylori or celiac sprue   ESOPHAGOGASTRODUODENOSCOPY (EGD) WITH PROPOFOL N/A 05/17/2016   Procedure: ESOPHAGOGASTRODUODENOSCOPY (EGD) WITH PROPOFOL;  Surgeon: Danie Binder, MD;  Location: [facility];  Service: Endoscopy;  Laterality: N/A;  12:45pm   GIVENS CAPSULE STUDY N/A 12/11/2014   MULTILPLE EROSION IN the stomach WITH ACTIVE OOZING. OCCASIONAL EROSIONS AND RARE ULCER SEEN IN PROXIMAL SMALL BOWEL . No masses or AVMs SEEN. NO OLD BLOOD OR FRESH BLOOD SEEN.    POLYPECTOMY N/A 11/24/2014   Procedure: POLYPECTOMY;  Surgeon: Danie Binder, MD;  Location: [facility];  Service: Endoscopy;  Laterality: N/A;   SAVORY DILATION N/A 05/17/2016   Procedure: SAVORY DILATION;  Surgeon: Danie Binder, MD;  Location: [facility];  Service: Endoscopy;  Laterality: N/A;    Family Psychiatric History: Please see initial evaluation for full details. I have reviewed the history. No updates at this time.     Family History:  Family History  Problem Relation Age of Onset   Hypertension Mother    Colon cancer Neg Hx     Social History:  Social History   Socioeconomic History   Marital status: Widowed    Spouse name: Not on file   Number of children: Not on file   Years of education: Not on file   Highest education level: Not on file  Occupational History   Not on file  Tobacco Use   Smoking status: Former    Packs/day: 0.25    Years: 20.00    Pack years: 5.00    Types: Cigarettes    Quit date: 01/24/1995    Years since quitting: 25.9   Smokeless tobacco: Never  Vaping  Use   Vaping Use: Never used  Substance and Sexual Activity   Alcohol use: No    Alcohol/week: 0.0 standard drinks   Drug use: No   Sexual activity: Never    Birth control/protection: Post-menopausal  Other Topics Concern   Not on file  Social History Narrative   Not on file   Social Determinants of Health   Financial Resource Strain: Not on file  Food Insecurity: Not on file  Transportation Needs: Not on file  Physical Activity: Not on file  Stress: Not on file  Social Connections: Not on file    Allergies:  Allergies  Allergen Reactions   Sulfa Antibiotics Rash    Metabolic Disorder Labs: Lab Results  Component Value  Date   HGBA1C 7.5 (A) 12/09/2020   MPG 217.34 02/21/2020   MPG 257.52 10/05/2019   No results found for: PROLACTIN Lab Results  Component Value Date   CHOL 107 12/02/2020   TRIG 82 12/02/2020   HDL 46 12/02/2020   CHOLHDL 2.3 12/02/2020   VLDL 30 10/05/2019   LDLCALC 45 12/02/2020   LDLCALC 81 10/05/2019   Lab Results  Component Value Date   TSH 1.370 12/02/2020   TSH 0.869 06/03/2020    Therapeutic Level Labs: No results found for: LITHIUM No results found for: VALPROATE No components found for:  CBMZ  Current Medications: Current Outpatient Medications  Medication Sig Dispense Refill   ABILIFY 2 MG tablet Take 1 tablet (2 mg total) by mouth daily. 30 tablet 5   acetaminophen (TYLENOL) 325 MG tablet Take 650 mg by mouth 3 (three) times daily as needed for mild pain or moderate pain.      ANTI-DIARRHEAL 2 MG tablet Take by mouth.     apixaban (ELIQUIS) 5 MG TABS tablet Take 1 tablet (5 mg total) by mouth 2 (two) times daily. 60 tablet 0   atorvastatin (LIPITOR) 40 MG tablet Take 1 tablet by mouth daily as needed.     calcium carbonate (TUMS - DOSED IN MG ELEMENTAL CALCIUM) 500 MG chewable tablet Chew 1 tablet by mouth 4 (four) times daily as needed for heartburn.     Carboxymethylcellulose Sodium (ARTIFICIAL TEARS OP) Place 1 drop into  both eyes 3 (three) times daily.     Cholecalciferol (VITAMIN D3) 50 MCG (2000 UT) TABS Take 2,000 Units by mouth daily.     clonazePAM (KLONOPIN) 0.5 MG tablet Take 1 tablet (0.5 mg total) by mouth 3 (three) times daily as needed for anxiety. (Patient taking differently: Take 0.5 mg by mouth 3 (three) times daily.) 12 tablet 0   diltiazem (CARDIZEM CD) 120 MG 24 hr capsule Take 120 mg by mouth daily.     DULoxetine (CYMBALTA) 60 MG capsule Take 60 mg by mouth daily.     gabapentin (NEURONTIN) 300 MG capsule Take 1 capsule (300 mg total) by mouth 3 (three) times daily as needed. (Patient taking differently: Take 300 mg by mouth 3 (three) times daily.)     glucose blood (EASYMAX TEST) test strip Use to check blood glucose 4 times daily, before meals and before bed, call the clinic if she has readings < 70 or > 300 for 3 tests in a row 400 strip 2   GOODSENSE ARTIFICIAL TEARS 0.5-0.6 % SOLN Apply to eye.     HYDROcodone-acetaminophen (NORCO) 10-325 MG tablet Take 1 tablet by mouth 3 (three) times daily as needed for severe pain. (Patient taking differently: Take 1 tablet by mouth 3 (three) times daily.) 12 tablet 0   hydrOXYzine (ATARAX/VISTARIL) 25 MG tablet Take 1 tablet (25 mg total) by mouth every 8 (eight) hours as needed for anxiety or itching. (Patient taking differently: Take 25 mg by mouth 3 (three) times daily.) 30 tablet 0   insulin aspart (NOVOLOG FLEXPEN) 100 UNIT/ML FlexPen Inject 10-16 Units into the skin 3 (three) times daily with meals. 15 mL 0   insulin detemir (LEVEMIR FLEXTOUCH) 100 UNIT/ML FlexPen Inject 55 Units into the skin at bedtime. 45 mL 3   isopropyl alcohol 70 % SOLN      Lancets 28G MISC USE TO CHECK BLOOD SUGAR TWICE DAILY. 100 each 5   latanoprost (XALATAN) 0.005 % ophthalmic solution Place 1 drop into both eyes  at bedtime.     loratadine (CLARITIN) 10 MG tablet Take 10 mg by mouth daily.     metoprolol succinate (TOPROL-XL) 25 MG 24 hr tablet Take 3 tablets (75 mg  total) by mouth 2 (two) times daily. 90 tablet 3   midodrine (PROAMATINE) 10 MG tablet Take 10 mg by mouth 3 (three) times daily.     mirabegron ER (MYRBETRIQ) 25 MG TB24 tablet Take 25 mg by mouth daily.     mirtazapine (REMERON) 30 MG tablet Take 30 mg by mouth at bedtime.     NOVOFINE AUTOCOVER PEN NEEDLE 30G X 8 MM MISC      Nutritional Supplements (ENSURE COMPLETE PO) Take by mouth daily.     ondansetron (ZOFRAN) 4 MG tablet Take 1 tablet (4 mg total) by mouth 2 (two) times daily as needed for nausea or vomiting. (Patient taking differently: Take 4 mg by mouth 4 (four) times daily -  with meals and at bedtime.)     pantoprazole (PROTONIX) 40 MG tablet Take 1 tablet (40 mg total) by mouth 2 (two) times daily before a meal. 180 tablet 1   Plecanatide (TRULANCE) 3 MG TABS Take 3 mg by mouth daily. 30 tablet 5   polyethylene glycol (MIRALAX / GLYCOLAX) 17 g packet Take 17 g by mouth daily. 14 each 0   [START ON 02/22/2021] risperiDONE (RISPERDAL) 1 MG tablet Take 1 tablet (1 mg total) by mouth in the morning and at bedtime. 60 tablet 5   senna (SENOKOT) 8.6 MG TABS tablet Take 1 tablet (8.6 mg total) by mouth daily. 120 tablet 0   TRADJENTA 5 MG TABS tablet TAKE (1) TABLET BY MOUTH ONCE DAILY. 30 tablet 3   zolpidem (AMBIEN) 5 MG tablet Take 5 mg by mouth at bedtime as needed.     No current facility-administered medications for this visit.     Musculoskeletal: Strength & Muscle Tone:  N/A Gait & Station:  N/A Patient leans: N/A  Psychiatric Specialty Exam: Review of Systems  There were no vitals taken for this visit.There is no height or weight on file to calculate BMI.  General Appearance: Fairly Groomed  Eye Contact:  Good  Speech:  Clear and Coherent  Volume:  Normal  Mood:  Depressed  Affect:  Blunt  Thought Process:  Coherent  Orientation:  Full (Time, Place, and Person)  Thought Content: Logical   Suicidal Thoughts:  No  Homicidal Thoughts:  No  Memory:  Immediate;   Good   Judgement:  Fair  Insight:  Present  Psychomotor Activity:  Normal  Concentration:  Concentration: Good and Attention Span: Good  Recall:  Good  Fund of Knowledge: Good  Language: Good  Akathisia:  No  Handed:  Right  AIMS (if indicated): not done  Assets:  Communication Skills Desire for Improvement  ADL's:  Intact  Cognition: WNL  Sleep:  Fair   Screenings: PHQ2-9    [place] Office Visit from 06/14/2020 in [facility] Video Visit from 04/06/2020 in [place] Office Visit from 11/28/2017 in [facility] Office Visit from 09/25/2017 in [facility] Office Visit from 03/28/2017 in [facility]  PHQ-2 Total Score 6 5 0 0 0  PHQ-9 Total Score 6 9 -- -- --      Flowsheet Row ED to Hosp-Admission (Discharged) from 04/18/2020 in [facility] Video Visit from 04/06/2020 in [facility] No Risk No  Risk        Assessment and Plan:  DEZTINY SARRA is a 75 y.o. year old female with a history of  schizophrenia, depression,  SVT, DVT, history of stroke on warfarin, type II diabetes, hypertension, GERD, who presents for follow up appointment for below.    1. Schizophrenia, unspecified type ([place]) 2. Mild episode of recurrent major depressive disorder ([place]) Although she continues to report occasional depressive symptoms and anxiety, it has been manageable, and the staff denies any concern.  She enjoys interaction with her family including her daughter.  Will continue Risperdal to target schizophrenia.  Will continue Abilify to target schizophrenia and then also as adjunctive treatment for depression.  Noted that although it is preferable to try monotherapy especially given its metabolic side effect, she had an episode of worsening in withdrawal symptoms and an aggression in the context of tapering  off Risperdal in the past.  Benefits of treating her condition outweighs risks.   This clinician has discussed the side effect associated with medication prescribed during this encounter. Please refer to notes in the previous encounters for more details.     Plan 1. Continue Abilify 2 mg at night 2. Continue Risperidone 1 mg twice a day  3. Next appointment:  4/13 at 10 AM for 30 mins, virtual visit.  [email or web address] - Fax AVS to [phone] - on clonazepam 0.5 mg three times a day for anxiety (prescribed by Dr. Merlene Laughter) - on mirtazapine 30 mg at night, prescribed by Dr. Legrand Rams - on duloxetine 120 mg daily (prescribed by Dr. Legrand Rams  - on Zolpidem 10 mg at night - prescribed by Dr. Legrand Rams -on gabapentin 300 mg TID     The patient demonstrates the following risk factors for suicide: Chronic risk factors for suicide include: psychiatric disorder of schizophrenia by history, depression. Acute risk factors for suicide include: unemployment. Protective factors for this patient include: positive social support and hope for the future. Considering these factors, the overall suicide risk at this point appears to be low. Patient is appropriate for outpatient follow up.  Courtney Clay, MD 01/06/2021, 11:22 AM

## 2021-01-03 DIAGNOSIS — I69359 Hemiplegia and hemiparesis following cerebral infarction affecting unspecified side: Secondary | ICD-10-CM | POA: Diagnosis not present

## 2021-01-03 DIAGNOSIS — G894 Chronic pain syndrome: Secondary | ICD-10-CM | POA: Diagnosis not present

## 2021-01-03 DIAGNOSIS — M5459 Other low back pain: Secondary | ICD-10-CM | POA: Diagnosis not present

## 2021-01-03 DIAGNOSIS — F419 Anxiety disorder, unspecified: Secondary | ICD-10-CM | POA: Diagnosis not present

## 2021-01-03 DIAGNOSIS — M5000 Cervical disc disorder with myelopathy, unspecified cervical region: Secondary | ICD-10-CM | POA: Diagnosis not present

## 2021-01-03 DIAGNOSIS — G2401 Drug induced subacute dyskinesia: Secondary | ICD-10-CM | POA: Diagnosis not present

## 2021-01-03 DIAGNOSIS — Z79891 Long term (current) use of opiate analgesic: Secondary | ICD-10-CM | POA: Diagnosis not present

## 2021-01-04 DIAGNOSIS — I951 Orthostatic hypotension: Secondary | ICD-10-CM | POA: Diagnosis not present

## 2021-01-04 DIAGNOSIS — I82509 Chronic embolism and thrombosis of unspecified deep veins of unspecified lower extremity: Secondary | ICD-10-CM | POA: Diagnosis not present

## 2021-01-04 DIAGNOSIS — I69354 Hemiplegia and hemiparesis following cerebral infarction affecting left non-dominant side: Secondary | ICD-10-CM | POA: Diagnosis not present

## 2021-01-04 DIAGNOSIS — E1165 Type 2 diabetes mellitus with hyperglycemia: Secondary | ICD-10-CM | POA: Diagnosis not present

## 2021-01-04 DIAGNOSIS — I4891 Unspecified atrial fibrillation: Secondary | ICD-10-CM | POA: Diagnosis not present

## 2021-01-04 DIAGNOSIS — E1142 Type 2 diabetes mellitus with diabetic polyneuropathy: Secondary | ICD-10-CM | POA: Diagnosis not present

## 2021-01-06 ENCOUNTER — Telehealth (INDEPENDENT_AMBULATORY_CARE_PROVIDER_SITE_OTHER): Payer: Medicare Other | Admitting: Psychiatry

## 2021-01-06 ENCOUNTER — Other Ambulatory Visit: Payer: Self-pay

## 2021-01-06 ENCOUNTER — Encounter: Payer: Self-pay | Admitting: Psychiatry

## 2021-01-06 DIAGNOSIS — F330 Major depressive disorder, recurrent, mild: Secondary | ICD-10-CM

## 2021-01-06 DIAGNOSIS — F209 Schizophrenia, unspecified: Secondary | ICD-10-CM | POA: Diagnosis not present

## 2021-01-06 MED ORDER — RISPERIDONE 1 MG PO TABS: 1.0000 mg | ORAL_TABLET | Freq: Two times a day (BID) | ORAL | 5 refills | Status: DC

## 2021-01-06 NOTE — Patient Instructions (Signed)
1. Continue Abilify 2 mg at night 2. Continue Risperidone 1 mg twice a day  3. Next appointment:  4/13 at 10 AM, video

## 2021-01-07 DIAGNOSIS — E1142 Type 2 diabetes mellitus with diabetic polyneuropathy: Secondary | ICD-10-CM | POA: Diagnosis not present

## 2021-01-07 DIAGNOSIS — E1165 Type 2 diabetes mellitus with hyperglycemia: Secondary | ICD-10-CM | POA: Diagnosis not present

## 2021-01-07 DIAGNOSIS — I82509 Chronic embolism and thrombosis of unspecified deep veins of unspecified lower extremity: Secondary | ICD-10-CM | POA: Diagnosis not present

## 2021-01-07 DIAGNOSIS — I4891 Unspecified atrial fibrillation: Secondary | ICD-10-CM | POA: Diagnosis not present

## 2021-01-07 DIAGNOSIS — I69354 Hemiplegia and hemiparesis following cerebral infarction affecting left non-dominant side: Secondary | ICD-10-CM | POA: Diagnosis not present

## 2021-01-07 DIAGNOSIS — I951 Orthostatic hypotension: Secondary | ICD-10-CM | POA: Diagnosis not present

## 2021-01-25 ENCOUNTER — Other Ambulatory Visit: Payer: Self-pay | Admitting: Psychiatry

## 2021-01-25 ENCOUNTER — Telehealth: Payer: Self-pay

## 2021-01-25 MED ORDER — RISPERIDONE 2 MG PO TABS: ORAL_TABLET | ORAL | 0 refills | Status: DC

## 2021-01-25 NOTE — Telephone Encounter (Signed)
sylvia from [facility] needs you to call her about behavior issues with this patient.

## 2021-01-25 NOTE — Telephone Encounter (Signed)
Discussed with the staff, [name].  She states that [name] seems to be acting out.  She called EMS, stating that she cannot breathe.  They could not find out anything, and her saturation was normal. She was evaluated by her PCP yesterday, and nothing was found. She wants to go to the hospital despite these evaluation. She sleeps good at night.  She has poor appetite.  The staff is not aware of any fever.  Although she has chronic pain at baseline, there is no worsening lately.  Noted that she is on good dose of antidepressant, prescribed by her PCP.  Will try higher dose of Risperdal this time.  - increase risperidone 1 mg in AM, 2 mg at night (was on 1 mg twice a day) - next appointment: 1/17 at 9:30, video

## 2021-01-31 ENCOUNTER — Other Ambulatory Visit: Payer: Self-pay | Admitting: Psychiatry

## 2021-02-01 ENCOUNTER — Other Ambulatory Visit: Payer: Self-pay | Admitting: Psychiatry

## 2021-02-07 NOTE — Progress Notes (Addendum)
Virtual Visit via Video Note  I connected with [name] on 02/08/21 at  9:30 AM EST by a video enabled telemedicine application and verified that I am speaking with the correct person using two identifiers.  Location: Patient: group home Provider: office Persons participated in the visit- patient, provider    I discussed the limitations of evaluation and management by telemedicine and the availability of in person appointments. The patient expressed understanding and agreed to proceed.   I discussed the assessment and treatment plan with the patient. The patient was provided an opportunity to ask questions and all were answered. The patient agreed with the plan and demonstrated an understanding of the instructions.   The patient was advised to call back or seek an in-person evaluation if the symptoms worsen or if the condition fails to improve as anticipated.  I provided 17 minutes of non-face-to-face time during this encounter.   [name], MD    [facility] - Closed MD/PA/NP OP Progress Note  02/08/2021 9:59 AM [name]  MRN:  [id number]  Chief Complaint:  Chief Complaint   Follow-up; Other    HPI:  This is a follow-up appointment for schizophrenia and depression.  She states that she feels "sick."  She thinks she has anxiety attack.  She reports chest pain with nausea.  She is unable to tell how long it has been going.  She denies feeling depressed.  She does not do crossword puzzle anymore.  She denies SI, HI.  She denies AH, VH, paranoia.  She denies ideas of reference.   Staff presents to the interview.  She states that [name] is complaining of anxiety.  She is asking to get medication.  She cannot sit still.  Although her symptoms subsided a little a few days after increasing up of Risperdal, the staff has not noticed any change since then.  She notices that [name] has been cursing at staff or yelling; it has been intensified, although she has been doing it to some extent at  her baseline.  She was seen by her PCP on January 1 for her condition.  EMS was called as well (Sat was reportedly normal).  The staff is unsure whether she had twelve-lead EKG. the staff agreed to contact her cardiologist for father evaluation to see if any physical symptoms contributing to her symptoms. There has been no known changes in routine or environment.  She takes medication regularly.  The staff has not heard any nighttime behavior or safety concerns.  She eats well.   Visit Diagnosis:    ICD-10-CM   1. Schizophrenia, unspecified type ([place])  F20.9     2. Mild episode of recurrent major depressive disorder (HCC)  F33.0       Past Psychiatric History: Please see initial evaluation for full details. I have reviewed the history. No updates at this time.     Past Medical History:  Past Medical History:  Diagnosis Date   Anxiety    Arthritis    Atrial fibrillation ([place])    Collagen vascular disease ([place])    COVID-19 virus infection    COVID-19+ approx 01/24/19; asymptomatic course with full recovery   Dependence on wheelchair    pivot/transfers   Depression    History of psychosis and previous suicide attempt   DVT, lower extremity, recurrent ([place])    Long-term Coumadin per Dr. Legrand Rams   Essential hypertension    GERD (gastroesophageal reflux disease)    Hemiplegia ([place]) 2010   Left side  History of stroke    Acute infarct and right cerebral white matter small vessel disease 12/10   Leg DVT (deep venous thromboembolism), acute ([place]) 2006   Schizophrenia ([place])    Stroke ([place])    left sided weakness   Type 2 diabetes mellitus ([place])     Past Surgical History:  Procedure Laterality Date   BACK SURGERY     BIOPSY N/A 11/24/2014   Procedure: BIOPSY;  Surgeon: Danie Binder, MD;  Location: [facility];  Service: Endoscopy;  Laterality: N/A;   BIOPSY  05/17/2016   Procedure: BIOPSY;  Surgeon: Danie Binder, MD;  Location: [facility];  Service: Endoscopy;;  gastric biopsy    COLONOSCOPY WITH PROPOFOL N/A 11/24/2014   Dr. Rudie Meyer polyps removed/moderate sized internal hemorrhoids, tubular adenomas. Next surveillance in 3 years   ESOPHAGOGASTRODUODENOSCOPY (EGD) WITH PROPOFOL N/A 11/24/2014   Dr. Clayburn Pert HH/patent stricture at the gastroesophageal junction, mild non-erosive gastritis, path negative for H.pylori or celiac sprue   ESOPHAGOGASTRODUODENOSCOPY (EGD) WITH PROPOFOL N/A 05/17/2016   Procedure: ESOPHAGOGASTRODUODENOSCOPY (EGD) WITH PROPOFOL;  Surgeon: Danie Binder, MD;  Location: [facility];  Service: Endoscopy;  Laterality: N/A;  12:45pm   GIVENS CAPSULE STUDY N/A 12/11/2014   MULTILPLE EROSION IN the stomach WITH ACTIVE OOZING. OCCASIONAL EROSIONS AND RARE ULCER SEEN IN PROXIMAL SMALL BOWEL . No masses or AVMs SEEN. NO OLD BLOOD OR FRESH BLOOD SEEN.    POLYPECTOMY N/A 11/24/2014   Procedure: POLYPECTOMY;  Surgeon: Danie Binder, MD;  Location: [facility];  Service: Endoscopy;  Laterality: N/A;   SAVORY DILATION N/A 05/17/2016   Procedure: SAVORY DILATION;  Surgeon: Danie Binder, MD;  Location: [facility];  Service: Endoscopy;  Laterality: N/A;    Family Psychiatric History: Please see initial evaluation for full details. I have reviewed the history. No updates at this time.     Family History:  Family History  Problem Relation Age of Onset   Hypertension Mother    Colon cancer Neg Hx     Social History:  Social History   Socioeconomic History   Marital status: Widowed    Spouse name: Not on file   Number of children: Not on file   Years of education: Not on file   Highest education level: Not on file  Occupational History   Not on file  Tobacco Use   Smoking status: Former    Packs/day: 0.25    Years: 20.00    Pack years: 5.00    Types: Cigarettes    Quit date: 01/24/1995    Years since quitting: 26.0   Smokeless tobacco: Never  Vaping Use   Vaping Use: Never used  Substance and Sexual Activity   Alcohol use: No     Alcohol/week: 0.0 standard drinks   Drug use: No   Sexual activity: Never    Birth control/protection: Post-menopausal  Other Topics Concern   Not on file  Social History Narrative   Not on file   Social Determinants of Health   Financial Resource Strain: Not on file  Food Insecurity: Not on file  Transportation Needs: Not on file  Physical Activity: Not on file  Stress: Not on file  Social Connections: Not on file    Allergies:  Allergies  Allergen Reactions   Sulfa Antibiotics Rash    Metabolic Disorder Labs: Lab Results  Component Value Date   HGBA1C 7.5 (A) 12/09/2020   MPG 217.34 02/21/2020   MPG 257.52 10/05/2019   No  results found for: PROLACTIN Lab Results  Component Value Date   CHOL 107 12/02/2020   TRIG 82 12/02/2020   HDL 46 12/02/2020   CHOLHDL 2.3 12/02/2020   VLDL 30 10/05/2019   LDLCALC 45 12/02/2020   LDLCALC 81 10/05/2019   Lab Results  Component Value Date   TSH 1.370 12/02/2020   TSH 0.869 06/03/2020    Therapeutic Level Labs: No results found for: LITHIUM No results found for: VALPROATE No components found for:  CBMZ  Current Medications: Current Outpatient Medications  Medication Sig Dispense Refill   ABILIFY 2 MG tablet Take 1 tablet (2 mg total) by mouth daily. 30 tablet 5   acetaminophen (TYLENOL) 325 MG tablet Take 650 mg by mouth 3 (three) times daily as needed for mild pain or moderate pain.      ANTI-DIARRHEAL 2 MG tablet Take by mouth.     apixaban (ELIQUIS) 5 MG TABS tablet Take 1 tablet (5 mg total) by mouth 2 (two) times daily. 60 tablet 0   atorvastatin (LIPITOR) 40 MG tablet Take 1 tablet by mouth daily as needed.     calcium carbonate (TUMS - DOSED IN MG ELEMENTAL CALCIUM) 500 MG chewable tablet Chew 1 tablet by mouth 4 (four) times daily as needed for heartburn.     Carboxymethylcellulose Sodium (ARTIFICIAL TEARS OP) Place 1 drop into both eyes 3 (three) times daily.     Cholecalciferol (VITAMIN D3) 50 MCG (2000 UT)  TABS Take 2,000 Units by mouth daily.     clonazePAM (KLONOPIN) 0.5 MG tablet Take 1 tablet (0.5 mg total) by mouth 3 (three) times daily as needed for anxiety. (Patient taking differently: Take 0.5 mg by mouth 3 (three) times daily.) 12 tablet 0   diltiazem (CARDIZEM CD) 120 MG 24 hr capsule Take 120 mg by mouth daily.     DULoxetine (CYMBALTA) 60 MG capsule Take 60 mg by mouth daily.     gabapentin (NEURONTIN) 300 MG capsule Take 1 capsule (300 mg total) by mouth 3 (three) times daily as needed. (Patient taking differently: Take 300 mg by mouth 3 (three) times daily.)     glucose blood (EASYMAX TEST) test strip Use to check blood glucose 4 times daily, before meals and before bed, call the clinic if she has readings < 70 or > 300 for 3 tests in a row 400 strip 2   GOODSENSE ARTIFICIAL TEARS 0.5-0.6 % SOLN Apply to eye.     HYDROcodone-acetaminophen (NORCO) 10-325 MG tablet Take 1 tablet by mouth 3 (three) times daily as needed for severe pain. (Patient taking differently: Take 1 tablet by mouth 3 (three) times daily.) 12 tablet 0   hydrOXYzine (ATARAX/VISTARIL) 25 MG tablet Take 1 tablet (25 mg total) by mouth every 8 (eight) hours as needed for anxiety or itching. (Patient taking differently: Take 25 mg by mouth 3 (three) times daily.) 30 tablet 0   insulin aspart (NOVOLOG FLEXPEN) 100 UNIT/ML FlexPen Inject 10-16 Units into the skin 3 (three) times daily with meals. 15 mL 0   insulin detemir (LEVEMIR FLEXTOUCH) 100 UNIT/ML FlexPen Inject 55 Units into the skin at bedtime. 45 mL 3   isopropyl alcohol 70 % SOLN      Lancets 28G MISC USE TO CHECK BLOOD SUGAR TWICE DAILY. 100 each 5   latanoprost (XALATAN) 0.005 % ophthalmic solution Place 1 drop into both eyes at bedtime.     loratadine (CLARITIN) 10 MG tablet Take 10 mg by mouth daily.  metoprolol succinate (TOPROL-XL) 25 MG 24 hr tablet Take 3 tablets (75 mg total) by mouth 2 (two) times daily. 90 tablet 3   midodrine (PROAMATINE) 10 MG tablet  Take 10 mg by mouth 3 (three) times daily.     mirabegron ER (MYRBETRIQ) 25 MG TB24 tablet Take 25 mg by mouth daily.     mirtazapine (REMERON) 30 MG tablet Take 30 mg by mouth at bedtime.     NOVOFINE AUTOCOVER PEN NEEDLE 30G X 8 MM MISC      Nutritional Supplements (ENSURE COMPLETE PO) Take by mouth daily.     ondansetron (ZOFRAN) 4 MG tablet Take 1 tablet (4 mg total) by mouth 2 (two) times daily as needed for nausea or vomiting. (Patient taking differently: Take 4 mg by mouth 4 (four) times daily -  with meals and at bedtime.)     pantoprazole (PROTONIX) 40 MG tablet Take 1 tablet (40 mg total) by mouth 2 (two) times daily before a meal. 180 tablet 1   Plecanatide (TRULANCE) 3 MG TABS Take 3 mg by mouth daily. 30 tablet 5   polyethylene glycol (MIRALAX / GLYCOLAX) 17 g packet Take 17 g by mouth daily. 14 each 0   risperiDONE (RISPERDAL) 2 MG tablet Take 0.5 tablets (1 mg total) by mouth daily AND 1 tablet (2 mg total) at bedtime. 45 tablet 0   senna (SENOKOT) 8.6 MG TABS tablet Take 1 tablet (8.6 mg total) by mouth daily. 120 tablet 0   TRADJENTA 5 MG TABS tablet TAKE (1) TABLET BY MOUTH ONCE DAILY. 30 tablet 3   zolpidem (AMBIEN) 5 MG tablet Take 5 mg by mouth at bedtime as needed.     No current facility-administered medications for this visit.     Musculoskeletal: Strength & Muscle Tone:  N/A Gait & Station:  N/A Patient leans: N/A  Psychiatric Specialty Exam: Review of Systems  Psychiatric/Behavioral:  Negative for agitation, behavioral problems, confusion, decreased concentration, dysphoric mood, hallucinations, self-injury, sleep disturbance and suicidal ideas. The patient is nervous/anxious. The patient is not hyperactive.   All other systems reviewed and are negative.  There were no vitals taken for this visit.There is no height or weight on file to calculate BMI.  General Appearance: Fairly Groomed  Eye Contact:  Fair  Speech:  Clear and Coherent  Volume:  Normal  Mood:    "nerves"  Affect:  Appropriate, Congruent, and tense  Thought Process:  Coherent  Orientation:  Full (Time, Place, and Person)  Thought Content: Logical   Suicidal Thoughts:  No  Homicidal Thoughts:  No  Memory:  Immediate;   Good  Judgement:  Good  Insight:  Present  Psychomotor Activity:  Normal  Concentration:  Concentration: Good and Attention Span: Good  Recall:  Good  Fund of Knowledge: Good  Language: Good  Akathisia:  No  Handed:  Right  AIMS (if indicated): not done  Assets:  Communication Skills Desire for Improvement  ADL's:  Intact  Cognition: WNL  Sleep:  Fair   Screenings: PHQ2-9    [facility] Office Visit from 06/14/2020 in [facility] Video Visit from 04/06/2020 in [facility] Office Visit from 11/28/2017 in [facility] Office Visit from 09/25/2017 in [facility] Office Visit from 03/28/2017 in [facility]  PHQ-2 Total Score 6 5 0 0 0  PHQ-9 Total Score 6 9 -- -- --      Flowsheet Row ED to Hosp-Admission (Discharged) from 04/18/2020 in  [facility] Video Visit from 04/06/2020 in [facility]  C-SSRS RISK CATEGORY No Risk No Risk        Assessment and Plan:  [name] is a 76 y.o. year old female with a history of schizophrenia, depression,  SVT, DVT, history of stroke on warfarin, type II diabetes, hypertension, GERD, who presents for follow up appointment for below.   1. Schizophrenia, unspecified type ([place]) 2. Mild episode of recurrent major depressive disorder ([place]) She denies any psychotic symptoms or depressive symptoms since the last visit.  Noted that although she used to enjoy interaction with her family including her daughter, or doing crossword puzzle, she has been doing less due to "anxiety" as described below.  She had limited benefit from up titration of Risperdal.   We will continue current dose of risperidone and Abilify to target schizophrenia and adjunctive treatment for depression at this time.  Noted that although it is preferable to try monotherapy especially given its metabolic side effect, she had an episode of worsening in withdrawal symptoms and an aggression in the context of tapering off Risperdal in the past.  Benefits of being on 2 antipsychotics outweighs risk of relapse in her symptoms.   # Anxiety  There has been significant worsening in perceived anxiety with chest pain, nausea. It is difficult to discern whether this is totally attributable to her mood symptoms and or if she has any underlying cardiac or GI issues.  The staff was advised to contact her cardiologist for further evaluation.    This clinician has discussed the side effect associated with medication prescribed during this encounter. Please refer to notes in the previous encounters for more details.     Plan 1. Continue Abilify 2 mg at night 2. Continue Risperidone 1 mg, 2 mg at night (uptitrated from 1 mg BID) 3. Next appointment: 2/14 at 48 Am for 30 mins, virtual visit.  [email or web address] - Fax AVS to [phone] - on clonazepam 0.5 mg three times a day for anxiety (prescribed by Dr. Merlene Laughter) - on mirtazapine 30 mg at night, prescribed by Dr. Legrand Rams - on duloxetine 120 mg daily (prescribed by Dr. Legrand Rams  - on Zolpidem 10 mg at night - prescribed by Dr. Legrand Rams -on gabapentin 300 mg TID     The patient demonstrates the following risk factors for suicide: Chronic risk factors for suicide include: psychiatric disorder of schizophrenia by history, depression. Acute risk factors for suicide include: unemployment. Protective factors for this patient include: positive social support and hope for the future. Considering these factors, the overall suicide risk at this point appears to be low. Patient is appropriate for outpatient follow up.  [name], MD 02/08/2021, 9:59  AM

## 2021-02-08 ENCOUNTER — Encounter: Payer: Self-pay | Admitting: Psychiatry

## 2021-02-08 ENCOUNTER — Telehealth: Payer: Self-pay | Admitting: Cardiology

## 2021-02-08 ENCOUNTER — Telehealth (INDEPENDENT_AMBULATORY_CARE_PROVIDER_SITE_OTHER): Payer: Medicare Other | Admitting: Psychiatry

## 2021-02-08 ENCOUNTER — Other Ambulatory Visit: Payer: Self-pay

## 2021-02-08 DIAGNOSIS — F209 Schizophrenia, unspecified: Secondary | ICD-10-CM

## 2021-02-08 DIAGNOSIS — F330 Major depressive disorder, recurrent, mild: Secondary | ICD-10-CM | POA: Diagnosis not present

## 2021-02-08 MED ORDER — RISPERIDONE 2 MG PO TABS: ORAL_TABLET | ORAL | 2 refills | Status: DC

## 2021-02-08 NOTE — Patient Instructions (Signed)
1. Continue Abilify 2 mg at night 2. Continue Risperidone 1 mg, 2 mg at night  3. Next appointment: 2/14 at 11 AM, video

## 2021-02-08 NOTE — Telephone Encounter (Signed)
A user error has taken place: encounter opened in error, closed for administrative reasons.

## 2021-02-25 ENCOUNTER — Other Ambulatory Visit: Payer: Self-pay | Admitting: Nurse Practitioner

## 2021-03-06 NOTE — Progress Notes (Addendum)
Virtual Visit via Video Note  I connected with [name] on 03/08/21 at 11:00 AM EST by a video enabled telemedicine application and verified that I am speaking with the correct person using two identifiers.  Location: Patient: group home Provider: office Persons participated in the visit- patient, provider    I discussed the limitations of evaluation and management by telemedicine and the availability of in person appointments. The patient expressed understanding and agreed to proceed.    I discussed the assessment and treatment plan with the patient. The patient was provided an opportunity to ask questions and all were answered. The patient agreed with the plan and demonstrated an understanding of the instructions.   The patient was advised to call back or seek an in-person evaluation if the symptoms worsen or if the condition fails to improve as anticipated.  I provided 8 minutes of non-face-to-face time during this encounter.   Courtney Clay, MD    [facility] MD/PA/NP OP Progress Note  03/08/2021 11:36 AM [name]  MRN:  [id number]  Chief Complaint:  Chief Complaint   Follow-up; Other    HPI:  The patient has checked in after 20 minutes of appointment time.  This is a follow-up appointment for schizophrenia and depression.  She states that she feels terrible this morning.  She feels nervous.  She would like her chest symptoms to be getting better.  She sleeps well with sleep medication.  She feels down and depressed at times.  She feels fatigue.  She has a slight decrease in appetite.  She denies SI.  She denies AH, VH or paranoia.   [name], staff at [facility] presents to the interview.  Dayne has been getting a little better.  She does not have much of the attitude, and appears to be calmer.  She has an upcoming appointment with her cardiologist.  Although she tends to be picky about eating, this is not new.  She drinks Ensure 3 times a week.  She takes medication  regularly.  There has been no change in her medications since the last visit.  [name] feels comfortable to stay on the medication as it is at this time.   Visit Diagnosis:    ICD-10-CM   1. Schizophrenia, unspecified type ([place])  F20.9     2. Mild episode of recurrent major depressive disorder (HCC)  F33.0     3. Anxiety state  F41.1       Past Psychiatric History: Please see initial evaluation for full details. I have reviewed the history. No updates at this time.     Past Medical History:  Past Medical History:  Diagnosis Date   Anxiety    Arthritis    Atrial fibrillation ([place])    Collagen vascular disease ([place])    COVID-19 virus infection    COVID-19+ approx 01/24/19; asymptomatic course with full recovery   Dependence on wheelchair    pivot/transfers   Depression    History of psychosis and previous suicide attempt   DVT, lower extremity, recurrent ([place])    Long-term Coumadin per Dr. Legrand Rams   Essential hypertension    GERD (gastroesophageal reflux disease)    Hemiplegia ([place]) 2010   Left side   History of stroke    Acute infarct and right cerebral white matter small vessel disease 12/10   Leg DVT (deep venous thromboembolism), acute ([place]) 2006   Schizophrenia ([place])    Stroke ([place])    left sided weakness   Type 2 diabetes mellitus (  [place])     Past Surgical History:  Procedure Laterality Date   BACK SURGERY     BIOPSY N/A 11/24/2014   Procedure: BIOPSY;  Surgeon: Danie Binder, MD;  Location: [facility];  Service: Endoscopy;  Laterality: N/A;   BIOPSY  05/17/2016   Procedure: BIOPSY;  Surgeon: Danie Binder, MD;  Location: [facility];  Service: Endoscopy;;  gastric biopsy   COLONOSCOPY WITH PROPOFOL N/A 11/24/2014   Dr. Rudie Meyer polyps removed/moderate sized internal hemorrhoids, tubular adenomas. Next surveillance in 3 years   ESOPHAGOGASTRODUODENOSCOPY (EGD) WITH PROPOFOL N/A 11/24/2014   Dr. Clayburn Pert HH/patent stricture at the gastroesophageal junction, mild  non-erosive gastritis, path negative for H.pylori or celiac sprue   ESOPHAGOGASTRODUODENOSCOPY (EGD) WITH PROPOFOL N/A 05/17/2016   Procedure: ESOPHAGOGASTRODUODENOSCOPY (EGD) WITH PROPOFOL;  Surgeon: Danie Binder, MD;  Location: [facility];  Service: Endoscopy;  Laterality: N/A;  12:45pm   GIVENS CAPSULE STUDY N/A 12/11/2014   MULTILPLE EROSION IN the stomach WITH ACTIVE OOZING. OCCASIONAL EROSIONS AND RARE ULCER SEEN IN PROXIMAL SMALL BOWEL . No masses or AVMs SEEN. NO OLD BLOOD OR FRESH BLOOD SEEN.    POLYPECTOMY N/A 11/24/2014   Procedure: POLYPECTOMY;  Surgeon: Danie Binder, MD;  Location: [facility];  Service: Endoscopy;  Laterality: N/A;   SAVORY DILATION N/A 05/17/2016   Procedure: SAVORY DILATION;  Surgeon: Danie Binder, MD;  Location: [facility];  Service: Endoscopy;  Laterality: N/A;    Family Psychiatric History: Please see initial evaluation for full details. I have reviewed the history. No updates at this time.     Family History:  Family History  Problem Relation Age of Onset   Hypertension Mother    Colon cancer Neg Hx     Social History:  Social History   Socioeconomic History   Marital status: Widowed    Spouse name: Not on file   Number of children: Not on file   Years of education: Not on file   Highest education level: Not on file  Occupational History   Not on file  Tobacco Use   Smoking status: Former    Packs/day: 0.25    Years: 20.00    Pack years: 5.00    Types: Cigarettes    Quit date: 01/24/1995    Years since quitting: 26.1   Smokeless tobacco: Never  Vaping Use   Vaping Use: Never used  Substance and Sexual Activity   Alcohol use: No    Alcohol/week: 0.0 standard drinks   Drug use: No   Sexual activity: Never    Birth control/protection: Post-menopausal  Other Topics Concern   Not on file  Social History Narrative   Not on file   Social Determinants of Health   Financial Resource Strain: Not on file  Food Insecurity: Not on  file  Transportation Needs: Not on file  Physical Activity: Not on file  Stress: Not on file  Social Connections: Not on file    Allergies:  Allergies  Allergen Reactions   Sulfa Antibiotics Rash    Metabolic Disorder Labs: Lab Results  Component Value Date   HGBA1C 7.5 (A) 12/09/2020   MPG 217.34 02/21/2020   MPG 257.52 10/05/2019   No results found for: PROLACTIN Lab Results  Component Value Date   CHOL 107 12/02/2020   TRIG 82 12/02/2020   HDL 46 12/02/2020   CHOLHDL 2.3 12/02/2020   VLDL 30 10/05/2019   LDLCALC 45 12/02/2020   LDLCALC 81 10/05/2019   Lab  Results  Component Value Date   TSH 1.370 12/02/2020   TSH 0.869 06/03/2020    Therapeutic Level Labs: No results found for: LITHIUM No results found for: VALPROATE No components found for:  CBMZ  Current Medications: Current Outpatient Medications  Medication Sig Dispense Refill   ABILIFY 2 MG tablet Take 1 tablet (2 mg total) by mouth daily. 30 tablet 5   acetaminophen (TYLENOL) 325 MG tablet Take 650 mg by mouth 3 (three) times daily as needed for mild pain or moderate pain.      ANTI-DIARRHEAL 2 MG tablet Take by mouth.     apixaban (ELIQUIS) 5 MG TABS tablet Take 1 tablet (5 mg total) by mouth 2 (two) times daily. 60 tablet 0   atorvastatin (LIPITOR) 40 MG tablet Take 1 tablet by mouth daily as needed.     calcium carbonate (TUMS - DOSED IN MG ELEMENTAL CALCIUM) 500 MG chewable tablet Chew 1 tablet by mouth 4 (four) times daily as needed for heartburn.     Carboxymethylcellulose Sodium (ARTIFICIAL TEARS OP) Place 1 drop into both eyes 3 (three) times daily.     Cholecalciferol (VITAMIN D3) 50 MCG (2000 UT) TABS Take 2,000 Units by mouth daily.     clonazePAM (KLONOPIN) 0.5 MG tablet Take 1 tablet (0.5 mg total) by mouth 3 (three) times daily as needed for anxiety. (Patient taking differently: Take 0.5 mg by mouth 3 (three) times daily.) 12 tablet 0   diltiazem (CARDIZEM CD) 120 MG 24 hr capsule Take 120  mg by mouth daily.     DULoxetine (CYMBALTA) 60 MG capsule Take 60 mg by mouth daily.     gabapentin (NEURONTIN) 300 MG capsule Take 1 capsule (300 mg total) by mouth 3 (three) times daily as needed. (Patient taking differently: Take 300 mg by mouth 3 (three) times daily.)     glucose blood (EASYMAX TEST) test strip Use to check blood glucose 4 times daily, before meals and before bed, call the clinic if she has readings < 70 or > 300 for 3 tests in a row 400 strip 2   GOODSENSE ARTIFICIAL TEARS 0.5-0.6 % SOLN Apply to eye.     HYDROcodone-acetaminophen (NORCO) 10-325 MG tablet Take 1 tablet by mouth 3 (three) times daily as needed for severe pain. (Patient taking differently: Take 1 tablet by mouth 3 (three) times daily.) 12 tablet 0   hydrOXYzine (ATARAX/VISTARIL) 25 MG tablet Take 1 tablet (25 mg total) by mouth every 8 (eight) hours as needed for anxiety or itching. (Patient taking differently: Take 25 mg by mouth 3 (three) times daily.) 30 tablet 0   insulin detemir (LEVEMIR FLEXTOUCH) 100 UNIT/ML FlexPen Inject 55 Units into the skin at bedtime. 45 mL 3   isopropyl alcohol 70 % SOLN      Lancets 28G MISC USE TO CHECK BLOOD SUGAR TWICE DAILY. 100 each 5   latanoprost (XALATAN) 0.005 % ophthalmic solution Place 1 drop into both eyes at bedtime.     loratadine (CLARITIN) 10 MG tablet Take 10 mg by mouth daily.     metoprolol succinate (TOPROL-XL) 25 MG 24 hr tablet Take 3 tablets (75 mg total) by mouth 2 (two) times daily. 90 tablet 3   midodrine (PROAMATINE) 10 MG tablet Take 10 mg by mouth 3 (three) times daily.     mirabegron ER (MYRBETRIQ) 25 MG TB24 tablet Take 25 mg by mouth daily.     mirtazapine (REMERON) 30 MG tablet Take 30 mg by mouth at  bedtime.     NOVOFINE AUTOCOVER PEN NEEDLE 30G X 8 MM MISC      NOVOLOG FLEXPEN 100 UNIT/ML FlexPen INJECT SUBCUTANEOUSLY AS FOLLOWS WITH MEALS: 90-150=10u: 151-200=11u: 201-250=12u: 251-300=13u: 301-350=14u: 351-400=15u: BS>400=16u & CALL MD. 15 mL 0    Nutritional Supplements (ENSURE COMPLETE PO) Take by mouth daily.     ondansetron (ZOFRAN) 4 MG tablet Take 1 tablet (4 mg total) by mouth 2 (two) times daily as needed for nausea or vomiting. (Patient taking differently: Take 4 mg by mouth 4 (four) times daily -  with meals and at bedtime.)     pantoprazole (PROTONIX) 40 MG tablet Take 1 tablet (40 mg total) by mouth 2 (two) times daily before a meal. 180 tablet 1   Plecanatide (TRULANCE) 3 MG TABS Take 3 mg by mouth daily. 30 tablet 5   polyethylene glycol (MIRALAX / GLYCOLAX) 17 g packet Take 17 g by mouth daily. 14 each 0   risperiDONE (RISPERDAL) 2 MG tablet Take 0.5 tablets (1 mg total) by mouth daily AND 1 tablet (2 mg total) at bedtime. 45 tablet 2   senna (SENOKOT) 8.6 MG TABS tablet Take 1 tablet (8.6 mg total) by mouth daily. 120 tablet 0   TRADJENTA 5 MG TABS tablet TAKE (1) TABLET BY MOUTH ONCE DAILY. 30 tablet 3   zolpidem (AMBIEN) 5 MG tablet Take 5 mg by mouth at bedtime as needed.     No current facility-administered medications for this visit.     Musculoskeletal: Strength & Muscle Tone:  N/A Gait & Station:  N/A Patient leans: N/A  Psychiatric Specialty Exam: Review of Systems  Psychiatric/Behavioral:  Positive for decreased concentration and dysphoric mood. Negative for agitation, behavioral problems, confusion, hallucinations, self-injury, sleep disturbance and suicidal ideas. The patient is nervous/anxious. The patient is not hyperactive.   All other systems reviewed and are negative.  There were no vitals taken for this visit.There is no height or weight on file to calculate BMI.  General Appearance: Fairly Groomed  Eye Contact:  Fair  Speech:  Clear and Coherent  Volume:  Normal  Mood:  Depressed  Affect:  Appropriate, Congruent, and Restricted  Thought Process:  Coherent  Orientation:  Full (Time, Place, and Person)  Thought Content: Logical   Suicidal Thoughts:  No  Homicidal Thoughts:  No  Memory:   Immediate;   Good  Judgement:  Fair  Insight:  Shallow  Psychomotor Activity:  Normal  Concentration:  Concentration: Good and Attention Span: Good  Recall:  Good  Fund of Knowledge: Good  Language: Good  Akathisia:  No  Handed:  Right  AIMS (if indicated): not done  Assets:  Communication Skills Desire for Improvement  ADL's:  Intact  Cognition: WNL  Sleep:  Fair   Screenings: PHQ2-9    [place] Office Visit from 06/14/2020 in [facility] Video Visit from 04/06/2020 in [facility] Office Visit from 11/28/2017 in [facility] Office Visit from 09/25/2017 in [facility] Office Visit from 03/28/2017 in [facility]  PHQ-2 Total Score 6 5 0 0 0  PHQ-9 Total Score 6 9 -- -- --      Flowsheet Row ED to Hosp-Admission (Discharged) from 04/18/2020 in [facility] Video Visit from 04/06/2020 in [facility] No Risk No Risk        Assessment and Plan:  EMMELIA HOLDSWORTH is a 76 y.o. year old female  with a history of schizophrenia, depression, SVT, DVT, history of stroke on warfarin, type II diabetes, hypertension, GERD, who presents for follow up appointment for below.   1. Schizophrenia, unspecified type ([place]) 2. Mild episode of recurrent major depressive disorder ([place]) 3. Anxiety state There has been slight improvement in "anxiety" with chest pain, nausea since the last visit.  She denies any psychotic symptoms, and has mild occasional depressive symptoms.  She has an upcoming appointment with her cardiologist to rule out any medical symptoms contributing to her symptoms.  Noted that she had limited benefit from recent up titration of Risperdal. Will continue current dose of Risperdal and Abilify to target schizophrenia and adjunctive treatment for depression at this time. Noted that although it  is preferable to try monotherapy especially given its metabolic side effect, she had an episode of worsening in withdrawal symptoms and an aggression in the context of tapering off Risperdal in the past.  Benefits of being on 2 antipsychotics outweighs risk of relapsing her symptoms.   This clinician has discussed the side effect associated with medication prescribed during this encounter. Please refer to notes in the previous encounters for more details.    Plan  Continue Abilify 2 mg at night  Continue Risperidone 1 mg, 2 mg at night (uptitrated from 1 mg BID)  Next appointment: 3/17 at 10 AM for 30 mins, virtual visit. [email or web address]  - Fax AVS to [phone]  - on clonazepam 0.5 mg three times a day for anxiety (prescribed by Dr. Merlene Laughter)  - on mirtazapine 30 mg at night, prescribed by Dr. Legrand Rams  - on duloxetine 120 mg daily (prescribed by Dr. Legrand Rams  - on Zolpidem 10 mg at night - prescribed by Dr. Legrand Rams  -on gabapentin 300 mg TID   Collaboration of Care: Other provider involved in patient's care AEB follow up with her cardiologist  Patient/Guardian was advised Release of Information must be obtained prior to any record release in order to collaborate their care with an outside provider. Patient/Guardian was advised if they have not already done so to contact the registration department to sign all necessary forms in order for [place] to release information regarding their care.   Consent: Patient/Guardian gives verbal consent for treatment and assignment of benefits for services provided during this telehealth visit. Patient/Guardian expressed understanding and agreed to proceed.    The patient demonstrates the following risk factors for suicide: Chronic risk factors for suicide include: psychiatric disorder of schizophrenia by history, depression. Acute risk factors for suicide include: unemployment. Protective factors for this patient include: positive social support and hope for  the future. Considering these factors, the overall suicide risk at this point appears to be low. Patient is appropriate for outpatient follow up.  Courtney Clay, MD 03/08/2021, 11:36 AM

## 2021-03-08 ENCOUNTER — Telehealth (INDEPENDENT_AMBULATORY_CARE_PROVIDER_SITE_OTHER): Payer: Medicare Other | Admitting: Psychiatry

## 2021-03-08 ENCOUNTER — Encounter: Payer: Self-pay | Admitting: Psychiatry

## 2021-03-08 ENCOUNTER — Other Ambulatory Visit: Payer: Self-pay

## 2021-03-08 DIAGNOSIS — F330 Major depressive disorder, recurrent, mild: Secondary | ICD-10-CM | POA: Diagnosis not present

## 2021-03-08 DIAGNOSIS — F209 Schizophrenia, unspecified: Secondary | ICD-10-CM

## 2021-03-08 DIAGNOSIS — F411 Generalized anxiety disorder: Secondary | ICD-10-CM

## 2021-03-08 NOTE — Patient Instructions (Signed)
Continue Abilify 2 mg at night  Continue Risperidone 1 mg, 2 mg at night Next appointment: 3/17 at 10 AM, video

## 2021-03-09 ENCOUNTER — Other Ambulatory Visit: Payer: Self-pay

## 2021-03-09 ENCOUNTER — Emergency Department (HOSPITAL_COMMUNITY): Payer: Medicare Other

## 2021-03-09 ENCOUNTER — Emergency Department (HOSPITAL_COMMUNITY)
Admission: EM | Admit: 2021-03-09 | Discharge: 2021-03-10 | Disposition: A | Discharge status: Home / Self Care | Payer: Medicare Other | Attending: Emergency Medicine | Admitting: Emergency Medicine

## 2021-03-09 ENCOUNTER — Encounter (HOSPITAL_COMMUNITY): Payer: Self-pay | Admitting: *Deleted

## 2021-03-09 DIAGNOSIS — Z794 Long term (current) use of insulin: Secondary | ICD-10-CM | POA: Diagnosis not present

## 2021-03-09 DIAGNOSIS — Z7901 Long term (current) use of anticoagulants: Secondary | ICD-10-CM | POA: Diagnosis not present

## 2021-03-09 DIAGNOSIS — G8929 Other chronic pain: Secondary | ICD-10-CM

## 2021-03-09 DIAGNOSIS — R0602 Shortness of breath: Secondary | ICD-10-CM | POA: Insufficient documentation

## 2021-03-09 DIAGNOSIS — M25561 Pain in right knee: Secondary | ICD-10-CM | POA: Diagnosis not present

## 2021-03-09 DIAGNOSIS — M25551 Pain in right hip: Secondary | ICD-10-CM | POA: Diagnosis not present

## 2021-03-09 DIAGNOSIS — M25562 Pain in left knee: Secondary | ICD-10-CM

## 2021-03-09 DIAGNOSIS — R531 Weakness: Secondary | ICD-10-CM | POA: Diagnosis not present

## 2021-03-09 DIAGNOSIS — M1711 Unilateral primary osteoarthritis, right knee: Secondary | ICD-10-CM

## 2021-03-09 DIAGNOSIS — Z20822 Contact with and (suspected) exposure to covid-19: Secondary | ICD-10-CM | POA: Insufficient documentation

## 2021-03-09 DIAGNOSIS — R739 Hyperglycemia, unspecified: Secondary | ICD-10-CM

## 2021-03-09 LAB — CBC WITH DIFFERENTIAL/PLATELET
Abs Immature Granulocytes: 0.05 10*3/uL (ref 0.00–0.07)
Basophils Absolute: 0 10*3/uL (ref 0.0–0.1)
Basophils Relative: 0 %
Eosinophils Absolute: 0.1 10*3/uL (ref 0.0–0.5)
Eosinophils Relative: 0 %
HCT: 38.4 % (ref 36.0–46.0)
Hemoglobin: 12.7 g/dL (ref 12.0–15.0)
Immature Granulocytes: 0 %
Lymphocytes Relative: 13 %
Lymphs Abs: 1.9 10*3/uL (ref 0.7–4.0)
MCH: 30 pg (ref 26.0–34.0)
MCHC: 33.1 g/dL (ref 30.0–36.0)
MCV: 90.6 fL (ref 80.0–100.0)
Monocytes Absolute: 0.9 10*3/uL (ref 0.1–1.0)
Monocytes Relative: 6 %
Neutro Abs: 12 10*3/uL — ABNORMAL HIGH (ref 1.7–7.7)
Neutrophils Relative %: 81 %
Platelets: 262 10*3/uL (ref 150–400)
RBC: 4.24 MIL/uL (ref 3.87–5.11)
RDW: 13.3 % (ref 11.5–15.5)
WBC: 15 10*3/uL — ABNORMAL HIGH (ref 4.0–10.5)
nRBC: 0 % (ref 0.0–0.2)

## 2021-03-09 LAB — URINALYSIS, ROUTINE W REFLEX MICROSCOPIC
Bilirubin Urine: NEGATIVE
Glucose, UA: NEGATIVE mg/dL
Hgb urine dipstick: NEGATIVE
Ketones, ur: NEGATIVE mg/dL
Leukocytes,Ua: NEGATIVE
Nitrite: NEGATIVE
Protein, ur: NEGATIVE mg/dL
Specific Gravity, Urine: 1.004 — ABNORMAL LOW (ref 1.005–1.030)
pH: 7 (ref 5.0–8.0)

## 2021-03-09 LAB — COMPREHENSIVE METABOLIC PANEL
ALT: 10 U/L (ref 0–44)
AST: 17 U/L (ref 15–41)
Albumin: 3.6 g/dL (ref 3.5–5.0)
Alkaline Phosphatase: 81 U/L (ref 38–126)
Anion gap: 11 (ref 5–15)
BUN: 17 mg/dL (ref 8–23)
CO2: 25 mmol/L (ref 22–32)
Calcium: 8.4 mg/dL — ABNORMAL LOW (ref 8.9–10.3)
Chloride: 97 mmol/L — ABNORMAL LOW (ref 98–111)
Creatinine, Ser: 0.91 mg/dL (ref 0.44–1.00)
GFR, Estimated: >60 mL/min (ref >60)
Glucose, Bld: 213 mg/dL — ABNORMAL HIGH (ref 70–99)
Potassium: 4.2 mmol/L (ref 3.5–5.1)
Sodium: 133 mmol/L — ABNORMAL LOW (ref 135–145)
Total Bilirubin: 0.7 mg/dL (ref 0.3–1.2)
Total Protein: 6.9 g/dL (ref 6.5–8.1)

## 2021-03-09 LAB — RESP PANEL BY RT-PCR (FLU A&B, COVID) ARPGX2
Influenza A by PCR: NEGATIVE
Influenza B by PCR: NEGATIVE
SARS Coronavirus 2 by RT PCR: NEGATIVE

## 2021-03-09 LAB — CK: Total CK: 88 U/L (ref 38–234)

## 2021-03-09 LAB — CBG MONITORING, ED: Glucose-Capillary: 205 mg/dL — ABNORMAL HIGH (ref 70–99)

## 2021-03-09 LAB — TROPONIN I (HIGH SENSITIVITY)
Troponin I (High Sensitivity): 7 ng/L (ref <18)
Troponin I (High Sensitivity): 7 ng/L (ref <18)

## 2021-03-09 MED ORDER — HYDROCODONE-ACETAMINOPHEN 5-325 MG PO TABS
2.0000 | ORAL_TABLET | Freq: Once | ORAL | Status: AC
  Administered 2021-03-09: 2 via ORAL
  Filled 2021-03-09: qty 2

## 2021-03-09 MED ORDER — LACTATED RINGERS IV BOLUS
1000.0000 mL | Freq: Once | INTRAVENOUS | Status: AC
  Administered 2021-03-09: 1000 mL via INTRAVENOUS

## 2021-03-09 MED ORDER — HYDROCODONE-ACETAMINOPHEN 5-325 MG PO TABS
2.0000 | ORAL_TABLET | Freq: Once | ORAL | Status: DC
Start: 2021-03-09 — End: 2021-03-09

## 2021-03-09 MED ORDER — TRAMADOL HCL 50 MG PO TABS: 50.0000 mg | ORAL_TABLET | Freq: Once | ORAL | Status: DC

## 2021-03-09 MED ORDER — FENTANYL CITRATE PF 50 MCG/ML IJ SOSY
50.0000 ug | PREFILLED_SYRINGE | Freq: Once | INTRAMUSCULAR | Status: AC
  Administered 2021-03-09: 50 ug via INTRAVENOUS
  Filled 2021-03-09: qty 1

## 2021-03-09 NOTE — ED Notes (Signed)
Pt given a bag meal at this time also bed adjusted for pt comfort also.

## 2021-03-09 NOTE — ED Triage Notes (Signed)
Pt brought in by [facility] from [facility] with c/o weakness and SOB. CBG 263. 96% RA, BP 106/70, P 71, NSR for EMS.

## 2021-03-09 NOTE — Discharge Instructions (Signed)
It was our pleasure to provide your ER care today - we hope that you feel better.  Take your pain mediation as need.   Follow up with your doctor in the coming week for recheck - call tomorrow to arrange appointment.  Return to ER if worse, new symptoms, high fevers, chest pain, increased trouble breathing, or other concern.

## 2021-03-09 NOTE — ED Provider Notes (Signed)
Signed out by Dr Regenia Skeeter to check pending labs and d/c.   Delta trop normal. UA neg for uti.    Pt alert, content, no distress. Currently vitals normal.  Pt is afebrile.  Pt appears stable for d/c per Dr Tyron Russell plan.  Rec pcp f/u.  Return precautions provided.      Lajean Saver, MD 03/09/21 1807

## 2021-03-09 NOTE — ED Provider Notes (Signed)
[facility] EMERGENCY DEPARTMENT Provider Note   CSN: [id number] Arrival date & time: 03/09/21  1131     History  Chief Complaint  Patient presents with   Weakness    [name] is a 76 y.o. female.  HPI 76 year old female presents with diffuse pain/weakness.  Primarily seems to be her legs that are hurting her.  She states that she has had issues with both knees/legs for months.  However she is also complaining of diffuse weakness and pain for the last week or so.  She denies shortness of breath to me.  No current fevers.  She lives in [place] home.  I discussed with [name] from her facility, who takes care of her.  Today she was complaining of a difficult time breathing but that is a recurrent issue for staff and they often feel like it is due to anxiety.  However she also stated that she could move her right leg, which patient tells me is due to pain, not weakness.  No recent medication changes or fevers.  Due to these multiple complaints they sent her out via EMS.  Home Medications Prior to Admission medications   Medication Sig Start Date End Date Taking? Authorizing Provider  ABILIFY 2 MG tablet Take 1 tablet (2 mg total) by mouth daily. 11/09/20 05/08/21 Yes [name], MD  acetaminophen (TYLENOL) 325 MG tablet Take 650 mg by mouth 3 (three) times daily as needed for mild pain or moderate pain.    Yes [provider]  apixaban (ELIQUIS) 5 MG TABS tablet Take 1 tablet (5 mg total) by mouth 2 (two) times daily. 02/21/20  Yes Richarda Osmond, MD  atorvastatin (LIPITOR) 40 MG tablet Take 1 tablet by mouth daily. 05/30/20  Yes [provider]  calcium carbonate (TUMS - DOSED IN MG ELEMENTAL CALCIUM) 500 MG chewable tablet Chew 1 tablet by mouth 4 (four) times daily as needed for heartburn.   Yes [provider]  Carboxymethylcellulose Sodium (ARTIFICIAL TEARS OP) Place 1 drop into both eyes 3 (three) times daily.   Yes [provider]  Cholecalciferol (VITAMIN D3) 50 MCG (2000 UT) TABS Take 2,000 Units by mouth daily.   Yes [provider]  clonazePAM (KLONOPIN) 0.5 MG tablet Take 1 tablet (0.5 mg total) by mouth 3 (three) times daily as needed for anxiety. Patient taking differently: Take 0.5 mg by mouth 3 (three) times daily. 04/26/20  Yes Johnson, Clanford L, MD  diltiazem (CARDIZEM CD) 120 MG 24 hr capsule Take 120 mg by mouth daily. 05/30/20  Yes [provider]  DULoxetine (CYMBALTA) 60 MG capsule Take 120 mg by mouth daily.   Yes [provider]  gabapentin (NEURONTIN) 300 MG capsule Take 1 capsule (300 mg total) by mouth 3 (three) times daily as needed. Patient taking differently: Take 300 mg by mouth 3 (three) times daily. 02/21/20  Yes Richarda Osmond, MD  HYDROcodone-acetaminophen (NORCO) 10-325 MG tablet Take 1 tablet by mouth 3 (three) times daily as needed for severe pain. Patient taking differently: Take 1 tablet by mouth 3 (three) times daily. 04/26/20  Yes Johnson, Clanford L, MD  hydrOXYzine (ATARAX/VISTARIL) 25 MG tablet Take 1 tablet (25 mg total) by mouth every 8 (eight) hours as needed for anxiety or itching. Patient taking differently: Take 25 mg by mouth 3 (three) times daily. 04/26/20  Yes Johnson, Clanford L, MD  insulin detemir (LEVEMIR FLEXTOUCH) 100 UNIT/ML FlexPen Inject 55 Units into the skin  at bedtime. 09/08/20  Yes Reardon, Juanetta Beets, NP  latanoprost (XALATAN) 0.005 % ophthalmic solution Place 1 drop into both eyes at bedtime.   Yes [provider]  loratadine (CLARITIN) 10 MG tablet Take 10 mg by mouth daily.   Yes [provider]  metoprolol succinate (TOPROL-XL) 25 MG 24 hr tablet Take 3 tablets (75 mg total) by mouth 2 (two) times daily. 04/26/20  Yes Johnson, Clanford L, MD  midodrine (PROAMATINE) 10 MG tablet Take 10 mg by mouth 3 (three) times daily.   Yes [provider]  mirabegron ER (MYRBETRIQ) 25 MG TB24 tablet Take 25 mg by  mouth daily.   Yes [provider]  mirtazapine (REMERON) 30 MG tablet Take 30 mg by mouth at bedtime.   Yes [provider]  NOVOLOG FLEXPEN 100 UNIT/ML FlexPen INJECT SUBCUTANEOUSLY AS FOLLOWS WITH MEALS: 90-150=10u: 151-200=11u: 201-250=12u: 251-300=13u: 301-350=14u: 351-400=15u: BS>400=16u & CALL MD. 02/28/21  Yes Brita Romp, NP  Nutritional Supplements (ENSURE COMPLETE PO) Take by mouth daily.   Yes [provider]  ondansetron (ZOFRAN) 4 MG tablet Take 1 tablet (4 mg total) by mouth 2 (two) times daily as needed for nausea or vomiting. Patient taking differently: Take 4 mg by mouth in the morning, at noon, in the evening, and at bedtime. 04/26/20  Yes Johnson, Clanford L, MD  pantoprazole (PROTONIX) 40 MG tablet Take 1 tablet (40 mg total) by mouth 2 (two) times daily before a meal. 11/17/20  Yes Mahala Menghini, PA-C  Plecanatide (TRULANCE) 3 MG TABS Take 3 mg by mouth daily. 11/17/20  Yes Mahala Menghini, PA-C  polyethylene glycol (MIRALAX / GLYCOLAX) 17 g packet Take 17 g by mouth daily. 02/22/20  Yes Richarda Osmond, MD  risperiDONE (RISPERDAL) 2 MG tablet Take 0.5 tablets (1 mg total) by mouth daily AND 1 tablet (2 mg total) at bedtime. 02/25/21 05/26/21 Yes [name], MD  senna (SENOKOT) 8.6 MG TABS tablet Take 1 tablet (8.6 mg total) by mouth daily. 02/22/20  Yes Richarda Osmond, MD  TRADJENTA 5 MG TABS tablet TAKE (1) TABLET BY MOUTH ONCE DAILY. Patient taking differently: Take 5 mg by mouth daily. 09/30/20  Yes Reardon, Juanetta Beets, NP  zolpidem (AMBIEN) 5 MG tablet Take 5 mg by mouth at bedtime as needed. 05/18/20  Yes [provider]  ANTI-DIARRHEAL 2 MG tablet Take 2 mg by mouth as needed for diarrhea or loose stools. 08/20/20   [provider]  glucose blood (EASYMAX TEST) test strip Use to check blood glucose 4 times daily, before meals and before bed, call the clinic if she has readings < 70 or > 300 for 3 tests in a row 11/08/20    Brita Romp, NP  Lancets 28G MISC USE TO CHECK BLOOD SUGAR TWICE DAILY. 06/29/20   Brita Romp, NP  NOVOFINE AUTOCOVER PEN NEEDLE 30G X 8 MM MISC  11/08/20   [provider]      Allergies    Sulfa antibiotics    Review of Systems   Review of Systems  Constitutional:  Negative for fever.  Respiratory:  Negative for cough and shortness of breath.   Cardiovascular:  Negative for chest pain.  Gastrointestinal:  Negative for abdominal pain and vomiting.  Musculoskeletal:  Positive for arthralgias.  Neurological:  Positive for weakness.   Physical Exam Updated Vital Signs BP 121/67    Pulse 86    Temp 98.5 F (36.9 C)    Resp (!)  22    Ht 5\' 3"  (1.6 m)    Wt 63 kg    SpO2 94%    BMI 24.62 kg/m  Physical Exam Vitals and nursing note reviewed.  Constitutional:      Appearance: She is well-developed.  HENT:     Head: Normocephalic and atraumatic.  Cardiovascular:     Rate and Rhythm: Normal rate. Rhythm irregular.     Pulses:          Posterior tibial pulses are 2+ on the right side and 2+ on the left side.     Heart sounds: Normal heart sounds.  Pulmonary:     Effort: Pulmonary effort is normal.     Breath sounds: Normal breath sounds.  Abdominal:     General: There is no distension.     Palpations: Abdomen is soft.     Tenderness: There is no abdominal tenderness.  Musculoskeletal:     Right knee: No swelling or effusion. Decreased range of motion. Tenderness present.     Left knee: No effusion or bony tenderness. Normal range of motion.     Comments: She resists right leg being flexed.  There is no erythema, warmth, or obvious swelling/effusion.  Skin:    General: Skin is warm and dry.  Neurological:     Mental Status: She is alert and oriented to person, place, and time.     Comments: 5/5 strength in BUE.  Lower extremity strength testing is difficult due to her pain.  She will not move the right leg or bend the right knee.  Left leg she will slowly  move against gravity.    ED Results / Procedures / Treatments   Labs (all labs ordered are listed, but only abnormal results are displayed) Labs Reviewed  COMPREHENSIVE METABOLIC PANEL - Abnormal; Notable for the following components:      Result Value   Sodium 133 (*)    Chloride 97 (*)    Glucose, Bld 213 (*)    Calcium 8.4 (*)    All other components within normal limits  CBC WITH DIFFERENTIAL/PLATELET - Abnormal; Notable for the following components:   WBC 15.0 (*)    Neutro Abs 12.0 (*)    All other components within normal limits  CBG MONITORING, ED - Abnormal; Notable for the following components:   Glucose-Capillary 205 (*)    All other components within normal limits  RESP PANEL BY RT-PCR (FLU A&B, COVID) ARPGX2  CK  URINALYSIS, ROUTINE W REFLEX MICROSCOPIC  TROPONIN I (HIGH SENSITIVITY)  TROPONIN I (HIGH SENSITIVITY)    EKG EKG Interpretation  Date/Time:  Wednesday March 09 2021 11:43:09 EST Ventricular Rate:  91 PR Interval:    QRS Duration: 94 QT Interval:  371 QTC Calculation: 457 R Axis:   56 Text Interpretation: Atrial fibrillation Borderline low voltage, extremity leads Borderline repolarization abnormality Confirmed by Sherwood Gambler [phone]) on 03/09/2021 1:45:34 PM  Radiology DG Chest 1 View  Result Date: 03/09/2021 CLINICAL DATA:  Weakness, pain EXAM: CHEST  1 VIEW COMPARISON:  Bibasilar atelectasis, 02/20/2020 FINDINGS: Enlargement of cardiac silhouette. Mediastinal contours and pulmonary vascularity normal. Atherosclerotic calcification aorta. Bibasilar atelectasis. No definite infiltrate, pleural effusion, or pneumothorax. Bones demineralized. IMPRESSION: Enlargement of cardiac silhouette with bibasilar atelectasis. Aortic Atherosclerosis (ICD10-I70.0). Electronically Signed   By: Lavonia Dana M.D.   On: 03/09/2021 14:41   CT Head Wo Contrast  Result Date: 03/09/2021 CLINICAL DATA:  Headache and weakness. EXAM: CT HEAD WITHOUT CONTRAST TECHNIQUE:  Contiguous axial  images were obtained from the base of the skull through the vertex without intravenous contrast. RADIATION DOSE REDUCTION: This exam was performed according to the departmental dose-optimization program which includes automated exposure control, adjustment of the mA and/or kV according to patient size and/or use of iterative reconstruction technique. COMPARISON:  MRI brain and CT head dated October 06, 2016. FINDINGS: Brain: No evidence of acute infarction, hemorrhage, hydrocephalus, extra-axial collection or mass lesion/mass effect. Stable atrophy and chronic microvascular ischemic changes. Vascular: Calcified atherosclerosis at the skull base. No hyperdense vessel. Skull: Negative for fracture or focal lesion. Sinuses/Orbits: No acute finding.  Prior endoscopic sinus surgery. Other: None. IMPRESSION: 1. No acute intracranial abnormality. Stable atrophy and chronic microvascular ischemic changes. Electronically Signed   By: Titus Dubin M.D.   On: 03/09/2021 14:19   DG Knee Complete 4 Views Right  Result Date: 03/09/2021 CLINICAL DATA:  RIGHT leg pain and weakness EXAM: RIGHT KNEE - COMPLETE 4+ VIEW COMPARISON:  02/28/2017 FINDINGS: Osseous demineralization. Tricompartmental joint space narrowing and spur formation. Chondrocalcinosis question CPPD. No acute fracture, dislocation, or bone destruction. No joint effusion. IMPRESSION: Osseous demineralization with degenerative changes and probable CPPD RIGHT knee. No acute osseous abnormalities. Electronically Signed   By: Lavonia Dana M.D.   On: 03/09/2021 14:43   DG HIPS BILAT WITH PELVIS 3-4 VIEWS  Result Date: 03/09/2021 CLINICAL DATA:  Weakness, pain EXAM: DG HIP (WITH OR WITHOUT PELVIS) 3-4V BILAT COMPARISON:  10/06/2016 FINDINGS: Osseous demineralization. Hip and SI joint spaces preserved. No fracture, dislocation, or bone destruction. IMPRESSION: Osseous demineralization. No acute abnormalities. Electronically Signed   By: Lavonia Dana M.D.   On: 03/09/2021 14:42    Procedures Procedures    Medications Ordered in ED Medications  HYDROcodone-acetaminophen (NORCO/VICODIN) 5-325 MG per tablet 2 tablet (has no administration in time range)  lactated ringers bolus 1,000 mL (1,000 mLs Intravenous New Bag/Given 03/09/21 1351)  fentaNYL (SUBLIMAZE) injection 50 mcg (50 mcg Intravenous Given 03/09/21 1433)    ED Course/ Medical Decision Making/ A&P                           Medical Decision Making Amount and/or Complexity of Data Reviewed Labs: ordered. Radiology: ordered.  Risk Prescription drug management.   Patient with primarily a complaint of generalized weakness and pain in her right knee.  There is no obvious effusion or infection on exam.  I do not think an arthrocentesis is needed.  She does endorse that she has gotten shots in that knee before and her knee has been an issue for quite some time.  X-rays of her chest, knee, and hips have been reviewed and there is significant arthritis but no obvious fractures, effusions, or pneumonia on the chest x-ray.  Labs have been reviewed by myself and she has a nonspecific leukocytosis but no kidney injury or acidosis.  Troponin is negative but given her acute dyspnea this morning she will probably need a second.  At this point, it is unclear why she would have the leukocytosis but she still has a urinalysis pending given the generalized weakness.  No focal weakness to suggest stroke.  CT head is unremarkable.  This was obtained primarily given the headache while on a blood thinner.  Care transferred to Dr. Ashok Cordia.        Final Clinical Impression(s) / ED Diagnoses Final diagnoses:  Right hip pain  Weakness    Rx / DC Orders ED Discharge Orders  None         Sherwood Gambler, MD 03/09/21 1550

## 2021-03-09 NOTE — ED Notes (Signed)
Patient transported to X-ray 

## 2021-03-11 ENCOUNTER — Other Ambulatory Visit: Payer: Self-pay | Admitting: "Endocrinology

## 2021-03-18 ENCOUNTER — Encounter: Payer: Self-pay | Admitting: Physician Assistant

## 2021-03-18 ENCOUNTER — Other Ambulatory Visit: Payer: Self-pay

## 2021-03-18 MED ORDER — LANCETS 28G MISC: 5 refills | Status: DC

## 2021-03-18 NOTE — Progress Notes (Unsigned)
Cardiology Office Note    Date:  03/18/2021   ID:  [name], DOB 02/25/1945, MRN [id number]  PCP:  Carrolyn Meiers, MD  Cardiologist:  Rozann Lesches, MD  Electrophysiologist:  None   Chief Complaint: ***  History of Present Illness:   [name] is a 76 y.o. female with history of paroxysmal atrial fibrillation and flutter, schizophrenia, recurrent DVT on Coumadin by PCP, anxiety, arthritis, depression with prior psychosis, HTN, orthostatic hypotension requiring midodrine, GERD, CVA, DM, mild carotid disease by duplex 2018, LVH, mild MR who presents for follow-up, last OV 04/2020.   The patient was remotely followed for history of chest pain and SVT, then was diagnosed with new onset atrial fib in 2021. She spontaneously converted to NSR on IV Cardizem. She was continued on PTA Coumadin which is also for hx of DVT managed by primary care. She discharged on Toprol for rate control. She had atrial flutter in followup in 09/2019 so was started on Cardizem. Stress test 09/2019 was low risk (inferior defect likely due to subdiaphragmatic attenuation, cannot completely exclude very mild inferior ischemia. Either findign would support low risk). 2D echo 09/2019 EF 60-65%, moderate concentric left ventricular hypertrophy and severe basal septal hypertrophy, mild MR. She had recurrent atrial arrhythmias in 03/2020 in the context of SBO.  Paroxysmal to persistent atrial fibrillation, also with history of atrial flutter Mixed hyperlipidemia Orthostatic hypotension LVH, mild MR by prior echo  Labwork independently reviewed: 02/2021 Na 133, K 4.2, Cr 0.91, LFTs ok, Hgb 12.7, plt 262, troponins and CK wnl  11/2020 thyroid OK, LDL 45   Cardiology Studies:   Studies reviewed are outlined and summarized above. Reports included below if pertinent.   Echo 09/2019  1. Left ventricular ejection fraction, by estimation, is 60 to 65%. The  left ventricle has normal function. The left  ventricle has no regional  wall motion abnormalities. There is moderate concentric left ventricular  hypertrophy and severe basal septal  hypertrophy .   2. Right ventricular systolic function is normal. The right ventricular  size is normal. Tricuspid regurgitation signal is inadequate for assessing  PA pressure.   3. Left atrial size was mildly dilated.   4. The mitral valve is normal in structure. Mild mitral valve  regurgitation. No evidence of mitral stenosis. Moderate mitral annular  calcification.   5. The aortic valve is tricuspid. Aortic valve regurgitation is not  visualized. Mild to moderate aortic valve sclerosis/calcification is  present, without any evidence of aortic stenosis.   6. The inferior vena cava is normal in size with greater than 50%  respiratory variability, suggesting right atrial pressure of 3 mmHg.   Nuc 09/2019 There was no ST segment deviation noted during stress. In and out of afib during study, The study is normal. Inferior defect likely due to subdiaphragmatic attenuation, cannot completely exclude very mild inferior ischemia. Either findign would support low risk. This is a low risk study. The left ventricular ejection fraction is normal (55-65%).   Carotid US 2018  RIGHT CAROTID ARTERY: Mild calcified plaque in the bulb. Low resistance internal carotid Doppler pattern.   RIGHT VERTEBRAL ARTERY:  Antegrade.   LEFT CAROTID ARTERY: Mild focal calcified plaque in the bulb. Low resistance internal carotid Doppler pattern is preserved.   LEFT VERTEBRAL ARTERY:  Antegrade.  IMPRESSION: Less than 50% stenosis in the right and left internal carotid arteries.     Electronically Signed   By: Rodena Goldmann.D.  On: 10/06/2016 07:47    Past Medical History:  Diagnosis Date   Anxiety    Arthritis    Atrial fibrillation ([place])    Collagen vascular disease ([place])    COVID-19 virus infection    COVID-19+ approx 01/24/19; asymptomatic course with full  recovery   Dependence on wheelchair    pivot/transfers   Depression    History of psychosis and previous suicide attempt   DVT, lower extremity, recurrent ([place])    Long-term Coumadin per Dr. Legrand Rams   Essential hypertension    GERD (gastroesophageal reflux disease)    Hemiplegia ([place]) 2010   Left side   History of stroke    Acute infarct and right cerebral white matter small vessel disease 12/10   Leg DVT (deep venous thromboembolism), acute ([place]) 2006   Schizophrenia ([place])    Stroke ([place])    left sided weakness   Type 2 diabetes mellitus ([place])     Past Surgical History:  Procedure Laterality Date   BACK SURGERY     BIOPSY N/A 11/24/2014   Procedure: BIOPSY;  Surgeon: Danie Binder, MD;  Location: [facility];  Service: Endoscopy;  Laterality: N/A;   BIOPSY  05/17/2016   Procedure: BIOPSY;  Surgeon: Danie Binder, MD;  Location: [facility];  Service: Endoscopy;;  gastric biopsy   COLONOSCOPY WITH PROPOFOL N/A 11/24/2014   Dr. Rudie Meyer polyps removed/moderate sized internal hemorrhoids, tubular adenomas. Next surveillance in 3 years   ESOPHAGOGASTRODUODENOSCOPY (EGD) WITH PROPOFOL N/A 11/24/2014   Dr. Clayburn Pert HH/patent stricture at the gastroesophageal junction, mild non-erosive gastritis, path negative for H.pylori or celiac sprue   ESOPHAGOGASTRODUODENOSCOPY (EGD) WITH PROPOFOL N/A 05/17/2016   Procedure: ESOPHAGOGASTRODUODENOSCOPY (EGD) WITH PROPOFOL;  Surgeon: Danie Binder, MD;  Location: [facility];  Service: Endoscopy;  Laterality: N/A;  12:45pm   GIVENS CAPSULE STUDY N/A 12/11/2014   MULTILPLE EROSION IN the stomach WITH ACTIVE OOZING. OCCASIONAL EROSIONS AND RARE ULCER SEEN IN PROXIMAL SMALL BOWEL . No masses or AVMs SEEN. NO OLD BLOOD OR FRESH BLOOD SEEN.    POLYPECTOMY N/A 11/24/2014   Procedure: POLYPECTOMY;  Surgeon: Danie Binder, MD;  Location: [facility];  Service: Endoscopy;  Laterality: N/A;   SAVORY DILATION N/A 05/17/2016   Procedure: SAVORY DILATION;  Surgeon:  Danie Binder, MD;  Location: [facility];  Service: Endoscopy;  Laterality: N/A;    Current Medications: No outpatient medications have been marked as taking for the 03/22/21 encounter (Appointment) with Charlie Pitter, PA-C.   ***   Allergies:   Sulfa antibiotics   Social History   Socioeconomic History   Marital status: Widowed    Spouse name: Not on file   Number of children: Not on file   Years of education: Not on file   Highest education level: Not on file  Occupational History   Not on file  Tobacco Use   Smoking status: Former    Packs/day: 0.25    Years: 20.00    Pack years: 5.00    Types: Cigarettes    Quit date: 01/24/1995    Years since quitting: 26.1   Smokeless tobacco: Never  Vaping Use   Vaping Use: Never used  Substance and Sexual Activity   Alcohol use: No    Alcohol/week: 0.0 standard drinks   Drug use: No   Sexual activity: Never    Birth control/protection: Post-menopausal  Other Topics Concern   Not on file  Social History Narrative   Not on file  Social Determinants of Health   Financial Resource Strain: Not on file  Food Insecurity: Not on file  Transportation Needs: Not on file  Physical Activity: Not on file  Stress: Not on file  Social Connections: Not on file     Family History:  The patient's ***family history includes Hypertension in her mother. There is no history of Colon cancer.  ROS:   Please see the history of present illness. Otherwise, review of systems is positive for ***.  All other systems are reviewed and otherwise negative.    EKG(s)/Additional Labs   EKG:  EKG is ordered today, personally reviewed, demonstrating ***  Recent Labs: 04/25/2020: Magnesium 2.0 12/02/2020: TSH 1.370 03/09/2021: ALT 10; BUN 17; Creatinine, Ser 0.91; Hemoglobin 12.7; Platelets 262; Potassium 4.2; Sodium 133  Recent Lipid Panel    Component Value Date/Time   CHOL 107 12/02/2020 0858   TRIG 82 12/02/2020 0858   HDL 46 12/02/2020  0858   CHOLHDL 2.3 12/02/2020 0858   CHOLHDL 3.8 10/05/2019 0457   VLDL 30 10/05/2019 0457   LDLCALC 45 12/02/2020 0858    PHYSICAL EXAM:    VS:  There were no vitals taken for this visit.  BMI: There is no height or weight on file to calculate BMI.  GEN: Well nourished, well developed female in no acute distress HEENT: normocephalic, atraumatic Neck: no JVD, carotid bruits, or masses Cardiac: ***RRR; no murmurs, rubs, or gallops, no edema  Respiratory:  clear to auscultation bilaterally, normal work of breathing GI: soft, nontender, nondistended, + BS MS: no deformity or atrophy Skin: warm and dry, no rash Neuro:  Alert and Oriented x 3, Strength and sensation are intact, follows commands Psych: euthymic mood, full affect  Wt Readings from Last 3 Encounters:  03/09/21 139 lb (63 kg)  11/17/20 131 lb (59.4 kg)  07/14/20 119 lb (54 kg)     ASSESSMENT & PLAN:   ***     Disposition: F/u with ***   Medication Adjustments/Labs and Tests Ordered: Current medicines are reviewed at length with the patient today.  Concerns regarding medicines are outlined above. Medication changes, Labs and Tests ordered today are summarized above and listed in the Patient Instructions accessible in Encounters.    Signed, Charlie Pitter, PA-C  03/18/2021 12:03 PM    [place] Location in [place], [place] [address] Ph: [phone]; Fax [phone]

## 2021-03-22 ENCOUNTER — Other Ambulatory Visit: Payer: Self-pay

## 2021-03-22 ENCOUNTER — Inpatient Hospital Stay (HOSPITAL_COMMUNITY)
Admission: EM | Admit: 2021-03-22 | Discharge: 2021-04-01 | DRG: 871 | Disposition: A | Discharge status: Skilled Nursing Facility | Payer: Medicare Other | Source: Skilled Nursing Facility | Attending: Internal Medicine | Admitting: Internal Medicine

## 2021-03-22 ENCOUNTER — Emergency Department (HOSPITAL_COMMUNITY): Payer: Medicare Other

## 2021-03-22 ENCOUNTER — Ambulatory Visit: Payer: Medicare Other | Admitting: Physician Assistant

## 2021-03-22 ENCOUNTER — Encounter (HOSPITAL_COMMUNITY): Payer: Self-pay | Admitting: Internal Medicine

## 2021-03-22 DIAGNOSIS — Z8249 Family history of ischemic heart disease and other diseases of the circulatory system: Secondary | ICD-10-CM | POA: Diagnosis not present

## 2021-03-22 DIAGNOSIS — E782 Mixed hyperlipidemia: Secondary | ICD-10-CM | POA: Diagnosis present

## 2021-03-22 DIAGNOSIS — G8929 Other chronic pain: Secondary | ICD-10-CM | POA: Diagnosis present

## 2021-03-22 DIAGNOSIS — K219 Gastro-esophageal reflux disease without esophagitis: Secondary | ICD-10-CM | POA: Diagnosis present

## 2021-03-22 DIAGNOSIS — Z452 Encounter for adjustment and management of vascular access device: Secondary | ICD-10-CM

## 2021-03-22 DIAGNOSIS — I480 Paroxysmal atrial fibrillation: Secondary | ICD-10-CM

## 2021-03-22 DIAGNOSIS — I69354 Hemiplegia and hemiparesis following cerebral infarction affecting left non-dominant side: Secondary | ICD-10-CM

## 2021-03-22 DIAGNOSIS — Z7901 Long term (current) use of anticoagulants: Secondary | ICD-10-CM | POA: Diagnosis not present

## 2021-03-22 DIAGNOSIS — J181 Lobar pneumonia, unspecified organism: Secondary | ICD-10-CM | POA: Diagnosis not present

## 2021-03-22 DIAGNOSIS — Z8616 Personal history of COVID-19: Secondary | ICD-10-CM | POA: Diagnosis not present

## 2021-03-22 DIAGNOSIS — Z7984 Long term (current) use of oral hypoglycemic drugs: Secondary | ICD-10-CM

## 2021-03-22 DIAGNOSIS — M1990 Unspecified osteoarthritis, unspecified site: Secondary | ICD-10-CM | POA: Diagnosis present

## 2021-03-22 DIAGNOSIS — I4819 Other persistent atrial fibrillation: Secondary | ICD-10-CM | POA: Diagnosis present

## 2021-03-22 DIAGNOSIS — F209 Schizophrenia, unspecified: Secondary | ICD-10-CM | POA: Diagnosis present

## 2021-03-22 DIAGNOSIS — I10 Essential (primary) hypertension: Secondary | ICD-10-CM | POA: Diagnosis present

## 2021-03-22 DIAGNOSIS — Z794 Long term (current) use of insulin: Secondary | ICD-10-CM

## 2021-03-22 DIAGNOSIS — A419 Sepsis, unspecified organism: Secondary | ICD-10-CM | POA: Diagnosis not present

## 2021-03-22 DIAGNOSIS — I4892 Unspecified atrial flutter: Secondary | ICD-10-CM | POA: Diagnosis present

## 2021-03-22 DIAGNOSIS — I340 Nonrheumatic mitral (valve) insufficiency: Secondary | ICD-10-CM

## 2021-03-22 DIAGNOSIS — Z87891 Personal history of nicotine dependence: Secondary | ICD-10-CM | POA: Diagnosis not present

## 2021-03-22 DIAGNOSIS — F419 Anxiety disorder, unspecified: Secondary | ICD-10-CM | POA: Diagnosis present

## 2021-03-22 DIAGNOSIS — Z86010 Personal history of colonic polyps: Secondary | ICD-10-CM | POA: Diagnosis not present

## 2021-03-22 DIAGNOSIS — R0602 Shortness of breath: Secondary | ICD-10-CM | POA: Diagnosis present

## 2021-03-22 DIAGNOSIS — E119 Type 2 diabetes mellitus without complications: Secondary | ICD-10-CM

## 2021-03-22 DIAGNOSIS — Z86718 Personal history of other venous thrombosis and embolism: Secondary | ICD-10-CM | POA: Diagnosis not present

## 2021-03-22 DIAGNOSIS — R600 Localized edema: Secondary | ICD-10-CM

## 2021-03-22 DIAGNOSIS — Z20822 Contact with and (suspected) exposure to covid-19: Secondary | ICD-10-CM | POA: Diagnosis present

## 2021-03-22 DIAGNOSIS — I951 Orthostatic hypotension: Secondary | ICD-10-CM | POA: Diagnosis not present

## 2021-03-22 DIAGNOSIS — E1165 Type 2 diabetes mellitus with hyperglycemia: Secondary | ICD-10-CM | POA: Diagnosis present

## 2021-03-22 DIAGNOSIS — Z79899 Other long term (current) drug therapy: Secondary | ICD-10-CM

## 2021-03-22 DIAGNOSIS — J90 Pleural effusion, not elsewhere classified: Secondary | ICD-10-CM

## 2021-03-22 DIAGNOSIS — J9601 Acute respiratory failure with hypoxia: Secondary | ICD-10-CM | POA: Diagnosis present

## 2021-03-22 DIAGNOSIS — Z882 Allergy status to sulfonamides status: Secondary | ICD-10-CM

## 2021-03-22 DIAGNOSIS — I4891 Unspecified atrial fibrillation: Secondary | ICD-10-CM | POA: Diagnosis present

## 2021-03-22 DIAGNOSIS — F32A Depression, unspecified: Secondary | ICD-10-CM | POA: Diagnosis present

## 2021-03-22 DIAGNOSIS — J811 Chronic pulmonary edema: Secondary | ICD-10-CM

## 2021-03-22 DIAGNOSIS — J189 Pneumonia, unspecified organism: Secondary | ICD-10-CM

## 2021-03-22 DIAGNOSIS — E8770 Fluid overload, unspecified: Secondary | ICD-10-CM | POA: Diagnosis not present

## 2021-03-22 DIAGNOSIS — I517 Cardiomegaly: Secondary | ICD-10-CM

## 2021-03-22 DIAGNOSIS — Z993 Dependence on wheelchair: Secondary | ICD-10-CM

## 2021-03-22 LAB — BLOOD GAS, VENOUS
Acid-Base Excess: 1.5 mmol/L (ref 0.0–2.0)
Bicarbonate: 25.9 mmol/L (ref 20.0–28.0)
Drawn by: 53361
O2 Saturation: 98.7 %
Patient temperature: 38.1
pCO2, Ven: 41 mmHg — ABNORMAL LOW (ref 44–60)
pH, Ven: 7.41 (ref 7.25–7.43)
pO2, Ven: 89 mmHg — ABNORMAL HIGH (ref 32–45)

## 2021-03-22 LAB — CBC WITH DIFFERENTIAL/PLATELET
Band Neutrophils: 7 %
Basophils Absolute: 0.1 10*3/uL (ref 0.0–0.1)
Basophils Relative: 1 %
Eosinophils Absolute: 0 10*3/uL (ref 0.0–0.5)
Eosinophils Relative: 0 %
HCT: 37.8 % (ref 36.0–46.0)
Hemoglobin: 12 g/dL (ref 12.0–15.0)
Lymphocytes Relative: 5 %
Lymphs Abs: 0.7 10*3/uL (ref 0.7–4.0)
MCH: 28.3 pg (ref 26.0–34.0)
MCHC: 31.7 g/dL (ref 30.0–36.0)
MCV: 89.2 fL (ref 80.0–100.0)
Monocytes Absolute: 0.3 10*3/uL (ref 0.1–1.0)
Monocytes Relative: 2 %
Neutro Abs: 12.9 10*3/uL — ABNORMAL HIGH (ref 1.7–7.7)
Neutrophils Relative %: 85 %
Platelets: 295 10*3/uL (ref 150–400)
RBC: 4.24 MIL/uL (ref 3.87–5.11)
RDW: 13.5 % (ref 11.5–15.5)
WBC: 14 10*3/uL — ABNORMAL HIGH (ref 4.0–10.5)
nRBC: 0 % (ref 0.0–0.2)

## 2021-03-22 LAB — COMPREHENSIVE METABOLIC PANEL
ALT: 12 U/L (ref 0–44)
AST: 15 U/L (ref 15–41)
Albumin: 3.4 g/dL — ABNORMAL LOW (ref 3.5–5.0)
Alkaline Phosphatase: 99 U/L (ref 38–126)
Anion gap: 12 (ref 5–15)
BUN: 14 mg/dL (ref 8–23)
CO2: 27 mmol/L (ref 22–32)
Calcium: 8.6 mg/dL — ABNORMAL LOW (ref 8.9–10.3)
Chloride: 96 mmol/L — ABNORMAL LOW (ref 98–111)
Creatinine, Ser: 0.75 mg/dL (ref 0.44–1.00)
GFR, Estimated: >60 mL/min (ref >60)
Glucose, Bld: 250 mg/dL — ABNORMAL HIGH (ref 70–99)
Potassium: 3.8 mmol/L (ref 3.5–5.1)
Sodium: 135 mmol/L (ref 135–145)
Total Bilirubin: 0.7 mg/dL (ref 0.3–1.2)
Total Protein: 7.6 g/dL (ref 6.5–8.1)

## 2021-03-22 LAB — HEMOGLOBIN A1C
Hgb A1c MFr Bld: 7.1 % — ABNORMAL HIGH (ref 4.8–5.6)
Mean Plasma Glucose: 157.07 mg/dL

## 2021-03-22 LAB — GLUCOSE, CAPILLARY: Glucose-Capillary: 190 mg/dL — ABNORMAL HIGH (ref 70–99)

## 2021-03-22 LAB — PROCALCITONIN: Procalcitonin: 0.92 ng/mL

## 2021-03-22 LAB — PROTIME-INR
INR: 1.2 (ref 0.8–1.2)
Prothrombin Time: 15.2 seconds (ref 11.4–15.2)

## 2021-03-22 LAB — BRAIN NATRIURETIC PEPTIDE: B Natriuretic Peptide: 365 pg/mL — ABNORMAL HIGH (ref 0.0–100.0)

## 2021-03-22 LAB — APTT: aPTT: 26 seconds (ref 24–36)

## 2021-03-22 LAB — LACTIC ACID, PLASMA
Lactic Acid, Venous: 2.5 mmol/L (ref 0.5–1.9)
Lactic Acid, Venous: 2.1 mmol/L (ref 0.5–1.9)

## 2021-03-22 LAB — RESP PANEL BY RT-PCR (FLU A&B, COVID) ARPGX2
Influenza A by PCR: NEGATIVE
Influenza B by PCR: NEGATIVE
SARS Coronavirus 2 by RT PCR: NEGATIVE

## 2021-03-22 MED ORDER — IPRATROPIUM-ALBUTEROL 0.5-2.5 (3) MG/3ML IN SOLN
3.0000 mL | Freq: Four times a day (QID) | RESPIRATORY_TRACT | Status: DC
Start: 2021-03-22 — End: 2021-03-26
  Administered 2021-03-22 – 2021-03-25 (×13): 3 mL via RESPIRATORY_TRACT
  Filled 2021-03-22 (×12): qty 3

## 2021-03-22 MED ORDER — MIRABEGRON ER 25 MG PO TB24
25.0000 mg | ORAL_TABLET | Freq: Every day | ORAL | Status: DC
  Administered 2021-03-23 – 2021-04-01 (×10): 25 mg via ORAL
  Filled 2021-03-22 (×10): qty 1

## 2021-03-22 MED ORDER — LACTATED RINGERS IV SOLN
INTRAVENOUS | Status: DC
  Administered 2021-03-22: 150 mL via INTRAVENOUS

## 2021-03-22 MED ORDER — ATORVASTATIN CALCIUM 40 MG PO TABS
40.0000 mg | ORAL_TABLET | Freq: Every day | ORAL | Status: DC
  Administered 2021-03-23 – 2021-04-01 (×10): 40 mg via ORAL
  Filled 2021-03-22 (×10): qty 1

## 2021-03-22 MED ORDER — ONDANSETRON HCL 4 MG PO TABS
4.0000 mg | ORAL_TABLET | Freq: Four times a day (QID) | ORAL | Status: DC | PRN
  Administered 2021-03-30: 4 mg via ORAL
  Filled 2021-03-22: qty 1

## 2021-03-22 MED ORDER — ONDANSETRON HCL 4 MG/2ML IJ SOLN
4.0000 mg | Freq: Four times a day (QID) | INTRAMUSCULAR | Status: DC | PRN
  Administered 2021-03-24 – 2021-04-01 (×7): 4 mg via INTRAVENOUS
  Filled 2021-03-22 (×7): qty 2

## 2021-03-22 MED ORDER — PANTOPRAZOLE SODIUM 40 MG PO TBEC
40.0000 mg | DELAYED_RELEASE_TABLET | Freq: Two times a day (BID) | ORAL | Status: DC
Start: 2021-03-22 — End: 2021-04-01
  Administered 2021-03-23 – 2021-04-01 (×19): 40 mg via ORAL
  Filled 2021-03-22 (×19): qty 1

## 2021-03-22 MED ORDER — LORAZEPAM 2 MG/ML IJ SOLN
1.0000 mg | Freq: Four times a day (QID) | INTRAMUSCULAR | Status: DC | PRN
  Administered 2021-03-22 – 2021-04-01 (×25): 1 mg via INTRAVENOUS
  Filled 2021-03-22 (×25): qty 1

## 2021-03-22 MED ORDER — MIDODRINE HCL 5 MG PO TABS
10.0000 mg | ORAL_TABLET | Freq: Three times a day (TID) | ORAL | Status: DC
  Administered 2021-03-22 – 2021-04-01 (×29): 10 mg via ORAL
  Filled 2021-03-22 (×30): qty 2

## 2021-03-22 MED ORDER — SODIUM CHLORIDE 0.9 % IV SOLN
2.0000 g | Freq: Two times a day (BID) | INTRAVENOUS | Status: DC
  Administered 2021-03-22 – 2021-03-24 (×4): 2 g via INTRAVENOUS
  Filled 2021-03-22 (×4): qty 2

## 2021-03-22 MED ORDER — SODIUM CHLORIDE 0.9 % IV SOLN
2.0000 g | Freq: Once | INTRAVENOUS | Status: AC
  Administered 2021-03-22: 2 g via INTRAVENOUS
  Filled 2021-03-22: qty 2

## 2021-03-22 MED ORDER — CHLORHEXIDINE GLUCONATE CLOTH 2 % EX PADS
6.0000 | MEDICATED_PAD | Freq: Every day | CUTANEOUS | Status: DC
  Administered 2021-03-23 – 2021-04-01 (×10): 6 via TOPICAL

## 2021-03-22 MED ORDER — POTASSIUM CHLORIDE IN NACL 20-0.9 MEQ/L-% IV SOLN
INTRAVENOUS | Status: DC
  Filled 2021-03-22: qty 1000

## 2021-03-22 MED ORDER — DULOXETINE HCL 60 MG PO CPEP
120.0000 mg | ORAL_CAPSULE | Freq: Every day | ORAL | Status: DC
  Administered 2021-03-23 – 2021-04-01 (×10): 120 mg via ORAL
  Filled 2021-03-22 (×10): qty 2

## 2021-03-22 MED ORDER — MIRTAZAPINE 15 MG PO TABS
30.0000 mg | ORAL_TABLET | Freq: Every day | ORAL | Status: DC
Start: 2021-03-22 — End: 2021-04-01
  Administered 2021-03-22 – 2021-03-31 (×10): 30 mg via ORAL
  Filled 2021-03-22 (×3): qty 1
  Filled 2021-03-22: qty 2
  Filled 2021-03-22 (×6): qty 1

## 2021-03-22 MED ORDER — HYDROXYZINE HCL 25 MG PO TABS
25.0000 mg | ORAL_TABLET | Freq: Three times a day (TID) | ORAL | Status: DC | PRN
  Administered 2021-03-23 – 2021-03-31 (×23): 25 mg via ORAL
  Filled 2021-03-22 (×23): qty 1

## 2021-03-22 MED ORDER — ACETAMINOPHEN 325 MG PO TABS
650.0000 mg | ORAL_TABLET | Freq: Once | ORAL | Status: AC
Start: 2021-03-22 — End: 2021-03-22
  Administered 2021-03-22: 650 mg via ORAL
  Filled 2021-03-22: qty 2

## 2021-03-22 MED ORDER — VANCOMYCIN HCL 1500 MG/300ML IV SOLN
1500.0000 mg | Freq: Once | INTRAVENOUS | Status: AC
  Administered 2021-03-22: 1500 mg via INTRAVENOUS
  Filled 2021-03-22: qty 300

## 2021-03-22 MED ORDER — ALPRAZOLAM 0.5 MG PO TABS
0.5000 mg | ORAL_TABLET | Freq: Once | ORAL | Status: AC
  Administered 2021-03-22: 0.5 mg via ORAL
  Filled 2021-03-22: qty 1

## 2021-03-22 MED ORDER — DILTIAZEM LOAD VIA INFUSION
15.0000 mg | Freq: Once | INTRAVENOUS | Status: AC
Start: 2021-03-22 — End: 2021-03-22
  Administered 2021-03-22: 15 mg via INTRAVENOUS
  Filled 2021-03-22: qty 15

## 2021-03-22 MED ORDER — ACETAMINOPHEN 325 MG PO TABS
650.0000 mg | ORAL_TABLET | Freq: Four times a day (QID) | ORAL | Status: DC | PRN
  Administered 2021-03-23 – 2021-04-01 (×12): 650 mg via ORAL
  Filled 2021-03-22 (×12): qty 2

## 2021-03-22 MED ORDER — METOPROLOL TARTRATE 5 MG/5ML IV SOLN
5.0000 mg | Freq: Once | INTRAVENOUS | Status: AC
  Administered 2021-03-22: 5 mg via INTRAVENOUS
  Filled 2021-03-22: qty 5

## 2021-03-22 MED ORDER — APIXABAN 5 MG PO TABS
5.0000 mg | ORAL_TABLET | Freq: Two times a day (BID) | ORAL | Status: DC
  Administered 2021-03-22 – 2021-04-01 (×20): 5 mg via ORAL
  Filled 2021-03-22 (×20): qty 1

## 2021-03-22 MED ORDER — DILTIAZEM HCL-DEXTROSE 125-5 MG/125ML-% IV SOLN (PREMIX)
5.0000 mg/h | INTRAVENOUS | Status: DC
  Administered 2021-03-22: 5 mg/h via INTRAVENOUS
  Administered 2021-03-23: 15 mg/h via INTRAVENOUS
  Filled 2021-03-22 (×3): qty 125

## 2021-03-22 MED ORDER — SODIUM CHLORIDE 0.9 % IV SOLN
500.0000 mg | INTRAVENOUS | Status: DC
  Administered 2021-03-22 – 2021-03-27 (×6): 500 mg via INTRAVENOUS
  Filled 2021-03-22 (×6): qty 5

## 2021-03-22 MED ORDER — GABAPENTIN 300 MG PO CAPS
300.0000 mg | ORAL_CAPSULE | Freq: Three times a day (TID) | ORAL | Status: DC
Start: 2021-03-22 — End: 2021-04-01
  Administered 2021-03-22 – 2021-04-01 (×29): 300 mg via ORAL
  Filled 2021-03-22 (×29): qty 1

## 2021-03-22 MED ORDER — HYDROCODONE-ACETAMINOPHEN 10-325 MG PO TABS
1.0000 | ORAL_TABLET | Freq: Four times a day (QID) | ORAL | Status: DC | PRN
  Administered 2021-03-22 – 2021-04-01 (×34): 1 via ORAL
  Filled 2021-03-22 (×34): qty 1

## 2021-03-22 MED ORDER — VANCOMYCIN HCL IN DEXTROSE 1-5 GM/200ML-% IV SOLN
1000.0000 mg | INTRAVENOUS | Status: DC
Start: 2021-03-23 — End: 2021-03-23

## 2021-03-22 MED ORDER — RISPERIDONE 1 MG PO TABS
1.0000 mg | ORAL_TABLET | Freq: Two times a day (BID) | ORAL | Status: DC
  Administered 2021-03-22 – 2021-04-01 (×20): 1 mg via ORAL
  Filled 2021-03-22 (×20): qty 1

## 2021-03-22 MED ORDER — METRONIDAZOLE 500 MG/100ML IV SOLN: 500.0000 mg | Freq: Once | INTRAVENOUS | Status: DC

## 2021-03-22 MED ORDER — METOPROLOL TARTRATE 5 MG/5ML IV SOLN
INTRAVENOUS | Status: AC
  Filled 2021-03-22: qty 5

## 2021-03-22 MED ORDER — ACETAMINOPHEN 650 MG RE SUPP
650.0000 mg | Freq: Four times a day (QID) | RECTAL | Status: DC | PRN
Start: 2021-03-22 — End: 2021-04-01
  Administered 2021-03-22: 650 mg via RECTAL
  Filled 2021-03-22: qty 1

## 2021-03-22 MED ORDER — METOPROLOL TARTRATE 5 MG/5ML IV SOLN
5.0000 mg | Freq: Four times a day (QID) | INTRAVENOUS | Status: DC
  Administered 2021-03-22 – 2021-03-23 (×3): 5 mg via INTRAVENOUS
  Filled 2021-03-22 (×3): qty 5

## 2021-03-22 MED ORDER — ARIPIPRAZOLE 2 MG PO TABS
2.0000 mg | ORAL_TABLET | Freq: Every day | ORAL | Status: DC
  Administered 2021-03-23 – 2021-04-01 (×10): 2 mg via ORAL
  Filled 2021-03-22 (×15): qty 1

## 2021-03-22 MED ORDER — VANCOMYCIN HCL IN DEXTROSE 1-5 GM/200ML-% IV SOLN: 1000.0000 mg | Freq: Once | INTRAVENOUS | Status: DC

## 2021-03-22 NOTE — ED Notes (Signed)
Hospitalist in room to assess patient at this time.

## 2021-03-22 NOTE — ED Triage Notes (Signed)
EMS called to [facility] for reports of [facility]. States that sats were 82% on RA. Patient is in Afib RVR on monitor. Patient has had cough with generalized sickness for the past two days.

## 2021-03-22 NOTE — Assessment & Plan Note (Addendum)
Continue home midodrine. 

## 2021-03-22 NOTE — Assessment & Plan Note (Addendum)
Continue Semglee 24 units plus novolog 8 unit TIDAC, plus SSI, NovoLog sliding scale hemoglobin A1c 7.1%.

## 2021-03-22 NOTE — ED Notes (Signed)
MD notified of patient elevated VS. Continues to yell out "help me."

## 2021-03-22 NOTE — Assessment & Plan Note (Addendum)
Secondary to pneumonia and afib RVR Initially required BiPAP Off BiPAP--she has been weaned back to RA 3/9--back on oxygen, appears more sob --personally reviewed CXR--increased interstitial markings --lasix 40 mg IV x 1 with clinical improvement 3/10--weaned off oxygen

## 2021-03-22 NOTE — Assessment & Plan Note (Addendum)
Now back on oral cardizem per cardiology service.  BP tolerating oral cardizem so far.   PO metoprolol added 3/8 D/c with metoprolol succinate 50 mg daily

## 2021-03-22 NOTE — Assessment & Plan Note (Addendum)
Continue Abilify, Risperdal, Remeron, Cymbalta Continue prn clonazepam

## 2021-03-22 NOTE — Assessment & Plan Note (Addendum)
Currently in RVR>>improved with med adjustment and treatment of underlying acute medical condition cardiology team added oral diltiazem 3/3 Remained on IV amiodarone and oral cardizem Oral metoprolol added, IV>>po amio D/C with amiodarone 200 mg bid x 5 days, then 200 mg daily thereafter D/C with metoprolol succinate 50 mg daily and Cardizem CD 120 daily

## 2021-03-22 NOTE — ED Notes (Signed)
MD made aware of patient VS. New orders to admin. Xanax to help with anxiety.

## 2021-03-22 NOTE — ED Notes (Signed)
Patient repeatedly yelling "Help me." States she wants to go home. Continuously removing oxygen.

## 2021-03-22 NOTE — Assessment & Plan Note (Addendum)
Treated with ceftriaxone/azithromycin for full 7 days MRSA screen negative  Recommend patient get PA/Lat CXR in 4-6 weeks to document resolution.

## 2021-03-22 NOTE — ED Notes (Signed)
Placed on Bi-pap. Tolerating well. Overall appearance improving.

## 2021-03-22 NOTE — Hospital Course (Addendum)
76 year old female with a history of paroxysmal atrial fibrillation on apixaban, hypertension, orthostatic hypotension, anxiety/depression, diabetes mellitus type 2, hyperlipidemia, CVA, chronic pain, schizophrenia, GERD presenting with 2-day history of coughing and shortness of breath from high [place].  History is limited secondary to patient being in extremis.  Upon EMS arrival, the patient was noted to have oxygen saturation 80% on room air.  She was noted to have atrial fibrillation with RVR.  The patient denies any chest pain but complains of shortness of breath and coughing.  There is no history of nausea, vomiting, diarrhea, domino pain.  In the ED, the patient had a fever up to 101.7 F with tachycardia initially in the 170s.  She was hemodynamically stable.  She was placed on BiPAP.  She was started on vancomycin, cefepime, metronidazole.  Chest x-ray showed bilateral interstitial and airspace opacities.  Lactic acid was 2.5 >>2.1.  Sodium 135, potassium 3.8, bicarbonate 27, serum creatinine 0.75.  LFTs were unremarkable.  WBC 14.0, hemoglobin 12.0, platelets 295,000.  03/23/2021:  pt taken off bipap in AM, on nasal cannula.  Cardiology stopped IV diltiazem and lopressor and started IV amiodarone.    03/24/2021: Afib RVR persists overnight, remains on IV amiodarone.  Cardio added digoxin.  Still with dyspnea symptoms and palpitations.    03/25/2021: Pt reports that she still has shortness of breath and palpitations and her anxiety is out of control. She remains on IV amiodarone. Additional IV bolus ordered by Dr. Harl Bowie.   03/26/2021: remains in afibRVR, on IV amio, oral diltiazem, oral digoxin, HR slightly better.  Complains of weakness, but no CP or SOB, c/o feeling anxious.   03/27/2021: Afib still difficult to control. PICC line placed 3/4. Brief time patient was off IV amiod on 3/4 due to no IV access.  BP tolerating oral diltiazem.  Pt remains very anxious.  She reports palpitations.  She denies cough,  chest pain and SOB. Plan to get up to chair today.    03/28/2021: Breathing much improved.  Back to baseline oxygen requirements.  Afib RVR persists on IV amiodarone, cardizem, digoxin.  Planning PT eval when heart rate better controlled for better idea of disposition.    03/29/2021: HR starting to improve. Completed full course of antibiotics for pneumonia.  Working on ambulating and working on discharge placement to rehab.  Cardiology says could possibly transition to oral agents 3/8.   03/30/21:  d/c digoxin.  Continue po diltiazem.  IV>>po amio.  PO metoprolol added.  HRs better controlled  03/31/21:  appeared a little sob. CXR showed increased interstitial markings.   Lasix 40 mg IV x 1  given with clinical improvement>>weaned off oxygen  03/31/21:  back on [place] oxygen, appears a little SOB;  hold discharge;  repeat CXR, may need thora of left pleural effusion

## 2021-03-22 NOTE — ED Notes (Signed)
MD in room to assess patient at this time. States that she will need to be placed on Bi-pap.

## 2021-03-22 NOTE — ED Notes (Signed)
Dentures placed in container and placed in personal belonging bag with clothing.

## 2021-03-22 NOTE — Assessment & Plan Note (Addendum)
SEPSIS PHYSIOLOGY RESOLVED NOW Present on admission Due to pneumonia Presented with fever, leukocytosis and respiratory failure - all treated and resolved Lactic 2.5>>2.1 Completed IVF Completed course of IV abx 03/28/21 Blood culture no growth to date.

## 2021-03-22 NOTE — ED Notes (Signed)
MD aware that Cardizem drip is at 15mg /hr and heart rate continues to be elevated.

## 2021-03-22 NOTE — H&P (Signed)
History and Physical    Patient: [name] ION:[id number] DOB: [birth year] DOA: 03/22/2021 DOS: the patient was seen and examined on 03/22/2021 PCP: Carrolyn Meiers, MD  Patient coming from: ALF/ILF  Chief Complaint:  Chief Complaint  Patient presents with   Shortness of Breath    HPI: [name] is a  76 year old female with a history of paroxysmal atrial fibrillation on apixaban, hypertension, orthostatic hypotension, anxiety/depression, diabetes mellitus type 2, hyperlipidemia, CVA, chronic pain, schizophrenia, GERD presenting with 2-day history of coughing and shortness of breath from high [place].  History is limited secondary to patient being in extremis.  Upon EMS arrival, the patient was noted to have oxygen saturation 80% on room air.  She was noted to have atrial fibrillation with RVR.  The patient denies any chest pain but complains of shortness of breath and coughing.  There is no history of nausea, vomiting, diarrhea, domino pain. In the ED, the patient had a fever up to 101.7 F with tachycardia initially in the 170s.  She was hemodynamically stable.  She was placed on BiPAP.  She was started on vancomycin, cefepime, metronidazole.  Chest x-ray showed bilateral interstitial and airspace opacities.  Lactic acid was 2.5 >>2.1.  Sodium 135, potassium 3.8, bicarbonate 27, serum creatinine 0.75.  LFTs were unremarkable.  WBC 14.0, hemoglobin 12.0, platelets 295,000.    Review of Systems: As mentioned in the history of present illness. All other systems reviewed and are negative. Past Medical History:  Diagnosis Date   Anxiety    Arthritis    Atrial fibrillation (HCC)    Atrial flutter ([place])    Collagen vascular disease ([place])    COVID-19 virus infection    COVID-19+ approx 01/24/19; asymptomatic course with full recovery   Dependence on wheelchair    pivot/transfers   Depression    History of psychosis and previous suicide attempt   DVT, lower extremity,  recurrent ([place])    Long-term Coumadin per Dr. Legrand Rams   Essential hypertension    GERD (gastroesophageal reflux disease)    Hemiplegia ([place]) 2010   Left side   History of stroke    Acute infarct and right cerebral white matter small vessel disease 12/10   Leg DVT (deep venous thromboembolism), acute ([place]) 2006   Orthostatic hypotension    Schizophrenia ([place])    Stroke ([place])    left sided weakness   Type 2 diabetes mellitus ([place])    Past Surgical History:  Procedure Laterality Date   BACK SURGERY     BIOPSY N/A 11/24/2014   Procedure: BIOPSY;  Surgeon: Danie Binder, MD;  Location: [facility];  Service: Endoscopy;  Laterality: N/A;   BIOPSY  05/17/2016   Procedure: BIOPSY;  Surgeon: Danie Binder, MD;  Location: [facility];  Service: Endoscopy;;  gastric biopsy   COLONOSCOPY WITH PROPOFOL N/A 11/24/2014   Dr. Rudie Meyer polyps removed/moderate sized internal hemorrhoids, tubular adenomas. Next surveillance in 3 years   ESOPHAGOGASTRODUODENOSCOPY (EGD) WITH PROPOFOL N/A 11/24/2014   Dr. Clayburn Pert HH/patent stricture at the gastroesophageal junction, mild non-erosive gastritis, path negative for H.pylori or celiac sprue   ESOPHAGOGASTRODUODENOSCOPY (EGD) WITH PROPOFOL N/A 05/17/2016   Procedure: ESOPHAGOGASTRODUODENOSCOPY (EGD) WITH PROPOFOL;  Surgeon: Danie Binder, MD;  Location: [facility];  Service: Endoscopy;  Laterality: N/A;  12:45pm   GIVENS CAPSULE STUDY N/A 12/11/2014   MULTILPLE EROSION IN the stomach WITH ACTIVE OOZING. OCCASIONAL EROSIONS AND RARE ULCER SEEN IN PROXIMAL SMALL BOWEL . No masses or  AVMs SEEN. NO OLD BLOOD OR FRESH BLOOD SEEN.    POLYPECTOMY N/A 11/24/2014   Procedure: POLYPECTOMY;  Surgeon: Danie Binder, MD;  Location: [facility];  Service: Endoscopy;  Laterality: N/A;   SAVORY DILATION N/A 05/17/2016   Procedure: SAVORY DILATION;  Surgeon: Danie Binder, MD;  Location: [facility];  Service: Endoscopy;  Laterality: N/A;   Social History:  reports that she  quit smoking about 26 years ago. Her smoking use included cigarettes. She has a 5.00 pack-year smoking history. She has never used smokeless tobacco. She reports that she does not drink alcohol and does not use drugs.  Allergies  Allergen Reactions   Sulfa Antibiotics Rash    Family History  Problem Relation Age of Onset   Hypertension Mother    Colon cancer Neg Hx     Prior to Admission medications   Medication Sig Start Date End Date Taking? Authorizing Provider  ABILIFY 2 MG tablet Take 1 tablet (2 mg total) by mouth daily. 11/09/20 05/08/21 Yes [name], MD  acetaminophen (TYLENOL) 325 MG tablet Take 650 mg by mouth 3 (three) times daily as needed for mild pain or moderate pain.    Yes [provider]  amLODipine (NORVASC) 5 MG tablet Take 5 mg by mouth daily.   Yes [provider]  ANTI-DIARRHEAL 2 MG tablet Take 2 mg by mouth as needed for diarrhea or loose stools. 08/20/20  Yes [provider]  apixaban (ELIQUIS) 5 MG TABS tablet Take 1 tablet (5 mg total) by mouth 2 (two) times daily. 02/21/20  Yes Richarda Osmond, MD  atorvastatin (LIPITOR) 40 MG tablet Take 1 tablet by mouth daily. 05/30/20  Yes [provider]  calcium carbonate (TUMS - DOSED IN MG ELEMENTAL CALCIUM) 500 MG chewable tablet Chew 1 tablet by mouth 4 (four) times daily as needed for heartburn.   Yes [provider]  Carboxymethylcellulose Sodium (ARTIFICIAL TEARS OP) Place 1 drop into both eyes 3 (three) times daily.   Yes [provider]  Cholecalciferol (VITAMIN D3) 50 MCG (2000 UT) TABS Take 2,000 Units by mouth daily.   Yes [provider]  clonazePAM (KLONOPIN) 0.5 MG tablet Take 1 tablet (0.5 mg total) by mouth 3 (three) times daily as needed for anxiety. Patient taking differently: Take 0.5 mg by mouth 3 (three) times daily. 04/26/20  Yes Johnson, Clanford L, MD  diltiazem (CARDIZEM CD) 120 MG 24 hr capsule Take 120 mg by mouth daily. 05/30/20   Yes [provider]  DULoxetine (CYMBALTA) 60 MG capsule Take 120 mg by mouth daily.   Yes [provider]  gabapentin (NEURONTIN) 300 MG capsule Take 1 capsule (300 mg total) by mouth 3 (three) times daily as needed. Patient taking differently: Take 300 mg by mouth 3 (three) times daily. 02/21/20  Yes Doristine Mango L, MD  glucose blood (EASYMAX TEST) test strip USE TO CHECK BLOOD SUGAR FOUR TIMES DAILY.(HOLD IF BS<70: CALL MD IF BS> 400) 03/14/21  Yes Reardon, Juanetta Beets, NP  HYDROcodone-acetaminophen (NORCO) 10-325 MG tablet Take 1 tablet by mouth 3 (three) times daily as needed for severe pain. Patient taking differently: Take 1 tablet by mouth 3 (three) times daily. 04/26/20  Yes Johnson, Clanford L, MD  hydrOXYzine (ATARAX/VISTARIL) 25 MG tablet Take 1 tablet (25 mg total) by mouth every 8 (eight) hours as needed for anxiety or itching. Patient taking differently: Take 25 mg by mouth 3 (three) times daily. 04/26/20  Yes Johnson, Clanford  L, MD  insulin detemir (LEVEMIR FLEXTOUCH) 100 UNIT/ML FlexPen Inject 55 Units into the skin at bedtime. 09/08/20  Yes Reardon, Juanetta Beets, NP  Lancets 28G MISC USE TO CHECK BLOOD SUGAR TWICE DAILY. 03/18/21  Yes Reardon, Juanetta Beets, NP  latanoprost (XALATAN) 0.005 % ophthalmic solution Place 1 drop into both eyes at bedtime.   Yes [provider]  loratadine (CLARITIN) 10 MG tablet Take 10 mg by mouth daily.   Yes [provider]  metoprolol succinate (TOPROL-XL) 25 MG 24 hr tablet Take 3 tablets (75 mg total) by mouth 2 (two) times daily. 04/26/20  Yes Johnson, Clanford L, MD  midodrine (PROAMATINE) 10 MG tablet Take 10 mg by mouth 3 (three) times daily.   Yes [provider]  mirabegron ER (MYRBETRIQ) 25 MG TB24 tablet Take 25 mg by mouth daily.   Yes [provider]  mirtazapine (REMERON) 30 MG tablet Take 30 mg by mouth at bedtime.   Yes [provider]  NOVOFINE AUTOCOVER PEN NEEDLE 30G X 8 MM [place]   11/08/20  Yes [provider]  NOVOLOG FLEXPEN 100 UNIT/ML FlexPen INJECT SUBCUTANEOUSLY AS FOLLOWS WITH MEALS: 90-150=10u: 151-200=11u: 201-250=12u: 251-300=13u: 301-350=14u: 351-400=15u: BS>400=16u & CALL MD. 02/28/21  Yes Brita Romp, NP  Nutritional Supplements (ENSURE COMPLETE PO) Take by mouth daily.   Yes [provider]  ondansetron (ZOFRAN) 4 MG tablet Take 1 tablet (4 mg total) by mouth 2 (two) times daily as needed for nausea or vomiting. Patient taking differently: Take 4 mg by mouth in the morning, at noon, in the evening, and at bedtime. 04/26/20  Yes Johnson, Clanford L, MD  pantoprazole (PROTONIX) 40 MG tablet Take 1 tablet (40 mg total) by mouth 2 (two) times daily before a meal. 11/17/20  Yes Mahala Menghini, PA-C  Plecanatide (TRULANCE) 3 MG TABS Take 3 mg by mouth daily. 11/17/20  Yes Mahala Menghini, PA-C  polyethylene glycol (MIRALAX / GLYCOLAX) 17 g packet Take 17 g by mouth daily. 02/22/20  Yes Richarda Osmond, MD  risperiDONE (RISPERDAL) 2 MG tablet Take 0.5 tablets (1 mg total) by mouth daily AND 1 tablet (2 mg total) at bedtime. 02/25/21 05/26/21 Yes [name], MD  senna (SENOKOT) 8.6 MG TABS tablet Take 1 tablet (8.6 mg total) by mouth daily. 02/22/20  Yes Richarda Osmond, MD  TRADJENTA 5 MG TABS tablet TAKE (1) TABLET BY MOUTH ONCE DAILY. Patient taking differently: Take 5 mg by mouth daily. 09/30/20  Yes Reardon, Juanetta Beets, NP  zolpidem (AMBIEN) 5 MG tablet Take 5 mg by mouth at bedtime as needed. 05/18/20  Yes [provider]    Physical Exam: Vitals:   03/22/21 1111 03/22/21 1130 03/22/21 1145 03/22/21 1200  BP:  (!) 119/54  129/66  Pulse:  (!) 102 (!) 143 64  Resp:  (!) 51 (!) 33 (!) 41  Temp: (!) 100.6 F (38.1 C)     TempSrc:      SpO2:  100% 100% 100%  Weight:      Height:       GENERAL:  A&O x 2, NAD, well developed, cooperative, follows commands HEENT: [place]/AT, No thrush, No icterus, No oral ulcers Neck:  No neck mass,  No meningismus, soft, supple CV: RRR, no S3, no S4, no rub, no JVD Lungs:  bilateral rales.  No wheeze Abd: soft/NT +BS, nondistended Ext: trace LE edema, no lymphangitis, no cyanosis, no rashes Neuro:  CN II-XII intact, strength 4/5 in RUE,  RLE, strength 4/5 LUE, LLE; sensation intact bilateral; no dysmetria; babinski equivocal   Data Reviewed: Data reviewed in history  Assessment and Plan: * Acute respiratory failure with hypoxia ([place])- (present on admission) Secondary to pneumonia Currently on  BiPAP--wean as tolerated back to RA Obtain VBG  Paroxysmal atrial fibrillation (HCC) Currently in RVR Has had a history of soft blood pressures with difficult to control RVR Continue diltiazem drip Start IV metoprolol in lieu of her baseline metoprolol succinate dose Continue midodrine Repeat echo   Sepsis due to undetermined organism ([place]) Present on admission Due to pneumonia Presents with fever, leukocytosis and respiratory failure Lactic 2.5>>2.1 Continue IVF Continue IV abx pending culture Follow blood culture   Lobar pneumonia (HCC) Continue vancomycin, cefepime Add azithromycin MRSA screen PCT  Schizophrenia ([place])- (present on admission) Continue Abilify, Risperdal, Remeron, Cymbalta  Orthostasis- (present on admission) Continue midodrine  Mixed hyperlipidemia- (present on admission) Continue statin  Essential hypertension, benign- (present on admission) Continue IV diltiazem and lieu of oral diltiazem CD IV metoprolol until able to tolerate p.o.  Uncontrolled type 2 diabetes mellitus with hyperglycemia, with long-term current use of insulin (HCC) Start reduced dose Semglee NovoLog sliding scale Repeat hemoglobin A1c       Advance Care Planning: FULL  Consults: none  Family Communication: none  Severity of Illness: The appropriate patient status for this patient is INPATIENT. Inpatient status is judged to be reasonable and necessary in order to  provide the required intensity of service to ensure the patient's safety. The patient's presenting symptoms, physical exam findings, and initial radiographic and laboratory data in the context of their chronic comorbidities is felt to place them at high risk for further clinical deterioration. Furthermore, it is not anticipated that the patient will be medically stable for discharge from the hospital within 2 midnights of admission.   * I certify that at the point of admission it is my clinical judgment that the patient will require inpatient hospital care spanning beyond 2 midnights from the point of admission due to high intensity of service, high risk for further deterioration and high frequency of surveillance required.*  Author: Orson Eva, MD 03/22/2021 12:38 PM  For on call review [email or web address].

## 2021-03-22 NOTE — Assessment & Plan Note (Signed)
Continue statin. 

## 2021-03-22 NOTE — Progress Notes (Signed)
Pharmacy Antibiotic Note  [name] is a 76 y.o. female admitted on 03/22/2021 with sepsis.  Pharmacy has been consulted for Vancomycin and Cefepime dosing. Received initial doses of vanc/cefepime/flagyl in ED.  Plan: Vanc 1gm IV q 24 hours Cefepime 2gm IV q 12h Monitor renal function, cultures, and symptom improvement  Height: 5\' 3"  (160 cm) Weight: 63 kg (138 lb 14.2 oz) IBW/kg (Calculated) : 52.4 Vd=0.72; Ke 0.046; Scr rounded up to 0.8 for CrCl ~ 50; Calculated AUC 478  Temp (24hrs), Avg:101.7 F (38.7 C), Min:101.7 F (38.7 C), Max:101.7 F (38.7 C)  Recent Labs  Lab 03/22/21 0938  WBC 14.0*  CREATININE 0.75  LATICACIDVEN 2.5*    Estimated Creatinine Clearance: 54.3 mL/min (by C-G formula based on SCr of 0.75 mg/dL).    Allergies  Allergen Reactions   Sulfa Antibiotics Rash    Antimicrobials this admission: 2/28 Vanc >> 2/28 Cefepime >> 2/28 Flagyl x1  Dose adjustments this admission:  Microbiology results: 2/28 BCx x2 >>   Thank you for allowing pharmacy to be a part of this patients care.  Lorenso Courier, PharmD Clinical Pharmacist 03/22/2021 11:04 AM

## 2021-03-22 NOTE — Progress Notes (Signed)
Elink following code sepsis °

## 2021-03-23 DIAGNOSIS — E782 Mixed hyperlipidemia: Secondary | ICD-10-CM | POA: Diagnosis not present

## 2021-03-23 DIAGNOSIS — I480 Paroxysmal atrial fibrillation: Secondary | ICD-10-CM

## 2021-03-23 DIAGNOSIS — J9601 Acute respiratory failure with hypoxia: Secondary | ICD-10-CM

## 2021-03-23 DIAGNOSIS — I4891 Unspecified atrial fibrillation: Secondary | ICD-10-CM

## 2021-03-23 DIAGNOSIS — J181 Lobar pneumonia, unspecified organism: Secondary | ICD-10-CM | POA: Diagnosis not present

## 2021-03-23 DIAGNOSIS — J189 Pneumonia, unspecified organism: Secondary | ICD-10-CM

## 2021-03-23 LAB — COMPREHENSIVE METABOLIC PANEL
ALT: 11 U/L (ref 0–44)
AST: 11 U/L — ABNORMAL LOW (ref 15–41)
Albumin: 2.5 g/dL — ABNORMAL LOW (ref 3.5–5.0)
Alkaline Phosphatase: 66 U/L (ref 38–126)
Anion gap: 7 (ref 5–15)
BUN: 14 mg/dL (ref 8–23)
CO2: 26 mmol/L (ref 22–32)
Calcium: 8 mg/dL — ABNORMAL LOW (ref 8.9–10.3)
Chloride: 103 mmol/L (ref 98–111)
Creatinine, Ser: 0.64 mg/dL (ref 0.44–1.00)
GFR, Estimated: >60 mL/min (ref >60)
Glucose, Bld: 240 mg/dL — ABNORMAL HIGH (ref 70–99)
Potassium: 4.2 mmol/L (ref 3.5–5.1)
Sodium: 136 mmol/L (ref 135–145)
Total Bilirubin: 0.9 mg/dL (ref 0.3–1.2)
Total Protein: 6 g/dL — ABNORMAL LOW (ref 6.5–8.1)

## 2021-03-23 LAB — URINALYSIS, ROUTINE W REFLEX MICROSCOPIC
Bilirubin Urine: NEGATIVE
Glucose, UA: NEGATIVE mg/dL
Hgb urine dipstick: NEGATIVE
Ketones, ur: 5 mg/dL — AB
Leukocytes,Ua: NEGATIVE
Nitrite: NEGATIVE
Protein, ur: NEGATIVE mg/dL
Specific Gravity, Urine: 1.025 (ref 1.005–1.030)
pH: 5 (ref 5.0–8.0)

## 2021-03-23 LAB — MRSA NEXT GEN BY PCR, NASAL: MRSA by PCR Next Gen: NOT DETECTED

## 2021-03-23 LAB — GLUCOSE, CAPILLARY
Glucose-Capillary: 221 mg/dL — ABNORMAL HIGH (ref 70–99)
Glucose-Capillary: 196 mg/dL — ABNORMAL HIGH (ref 70–99)
Glucose-Capillary: 198 mg/dL — ABNORMAL HIGH (ref 70–99)
Glucose-Capillary: 235 mg/dL — ABNORMAL HIGH (ref 70–99)

## 2021-03-23 LAB — CBC
HCT: 30.8 % — ABNORMAL LOW (ref 36.0–46.0)
Hemoglobin: 9.8 g/dL — ABNORMAL LOW (ref 12.0–15.0)
MCH: 28.7 pg (ref 26.0–34.0)
MCHC: 31.8 g/dL (ref 30.0–36.0)
MCV: 90.1 fL (ref 80.0–100.0)
Platelets: 253 10*3/uL (ref 150–400)
RBC: 3.42 MIL/uL — ABNORMAL LOW (ref 3.87–5.11)
RDW: 13.5 % (ref 11.5–15.5)
WBC: 13.4 10*3/uL — ABNORMAL HIGH (ref 4.0–10.5)
nRBC: 0 % (ref 0.0–0.2)

## 2021-03-23 MED ORDER — INSULIN ASPART 100 UNIT/ML IJ SOLN
0.0000 [IU] | Freq: Three times a day (TID) | INTRAMUSCULAR | Status: DC
  Administered 2021-03-23 – 2021-03-24 (×5): 2 [IU] via SUBCUTANEOUS
  Administered 2021-03-25 (×2): 3 [IU] via SUBCUTANEOUS
  Administered 2021-03-25: 09:00:00 5 [IU] via SUBCUTANEOUS
  Administered 2021-03-26: 17:00:00 2 [IU] via SUBCUTANEOUS
  Administered 2021-03-26: 3 [IU] via SUBCUTANEOUS
  Administered 2021-03-26: 08:00:00 2 [IU] via SUBCUTANEOUS
  Administered 2021-03-27: 5 [IU] via SUBCUTANEOUS
  Administered 2021-03-27: 3 [IU] via SUBCUTANEOUS
  Administered 2021-03-28: 2 [IU] via SUBCUTANEOUS
  Administered 2021-03-28: 5 [IU] via SUBCUTANEOUS
  Administered 2021-03-29: 2 [IU] via SUBCUTANEOUS
  Administered 2021-03-29: 5 [IU] via SUBCUTANEOUS
  Administered 2021-03-29 – 2021-03-30 (×2): 3 [IU] via SUBCUTANEOUS
  Administered 2021-03-30 – 2021-03-31 (×2): 2 [IU] via SUBCUTANEOUS
  Administered 2021-03-31 (×2): 1 [IU] via SUBCUTANEOUS
  Administered 2021-04-01: 3 [IU] via SUBCUTANEOUS
  Administered 2021-04-01: 7 [IU] via SUBCUTANEOUS

## 2021-03-23 MED ORDER — METOPROLOL TARTRATE 5 MG/5ML IV SOLN
2.5000 mg | Freq: Once | INTRAVENOUS | Status: AC
  Administered 2021-03-23: 2.5 mg via INTRAVENOUS
  Filled 2021-03-23: qty 5

## 2021-03-23 MED ORDER — AMIODARONE HCL IN DEXTROSE 360-4.14 MG/200ML-% IV SOLN
30.0000 mg/h | INTRAVENOUS | Status: DC
Start: 2021-03-23 — End: 2021-03-24
  Administered 2021-03-23 (×2): 30 mg/h via INTRAVENOUS
  Filled 2021-03-23: qty 200

## 2021-03-23 MED ORDER — INSULIN GLARGINE-YFGN 100 UNIT/ML ~~LOC~~ SOLN
14.0000 [IU] | Freq: Every day | SUBCUTANEOUS | Status: DC
  Administered 2021-03-23 – 2021-03-24 (×2): 14 [IU] via SUBCUTANEOUS
  Filled 2021-03-23 (×3): qty 0.14

## 2021-03-23 MED ORDER — AMIODARONE HCL IN DEXTROSE 360-4.14 MG/200ML-% IV SOLN
60.0000 mg/h | INTRAVENOUS | Status: DC
Start: 2021-03-23 — End: 2021-03-23
  Administered 2021-03-23 (×2): 60 mg/h via INTRAVENOUS
  Filled 2021-03-23 (×2): qty 200

## 2021-03-23 MED ORDER — INSULIN ASPART 100 UNIT/ML IJ SOLN
2.0000 [IU] | Freq: Three times a day (TID) | INTRAMUSCULAR | Status: DC
  Administered 2021-03-23 – 2021-03-24 (×2): 2 [IU] via SUBCUTANEOUS

## 2021-03-23 MED ORDER — AMIODARONE LOAD VIA INFUSION
150.0000 mg | Freq: Once | INTRAVENOUS | Status: AC
  Administered 2021-03-23: 150 mg via INTRAVENOUS
  Filled 2021-03-23: qty 83.34

## 2021-03-23 MED ORDER — GUAIFENESIN-DM 100-10 MG/5ML PO SYRP
5.0000 mL | ORAL_SOLUTION | ORAL | Status: DC | PRN
  Administered 2021-03-23 – 2021-03-31 (×25): 5 mL via ORAL
  Filled 2021-03-23 (×27): qty 5

## 2021-03-23 NOTE — Progress Notes (Signed)
Inpatient Diabetes Program Recommendations ? ?AACE/ADA: New Consensus Statement on Inpatient Glycemic Control (2015) ? ?Target Ranges:  Prepandial:   less than 140 mg/dL ?     Peak postprandial:   less than 180 mg/dL (1-2 hours) ?     Critically ill patients:  140 - 180 mg/dL  ? ? Latest Reference Range & Units 03/22/21 22:34 03/23/21 07:30  ?Glucose-Capillary 70 - 99 mg/dL 190 (H) 221 (H)  ? ? ?Admit with:  ?Acute respiratory failure with hypoxia Secondary to pneumonia ?A Fib ?Sepsis ? ?History: DM2, CVA, Schizophrenia ? ?Home DM Meds: Levemir 55 units QHS ?      Novolog 10-15 units TID with meals per SSI ?      Tradjenta 5 mg daily ? ?Current Orders: None yet ? ? ? ? ?MD- No orders for CBGs nor insulin yet.  Please consider: ? ?1. Start Novolog Sensitive Correction Scale/ SSI (0-9 units) Q4 hours (Currently NPO) ? ?2. Start Semglee 14 units QHS (25% home dose) ? ? ? ?--Will follow patient during hospitalization-- ? ?Wyn Quaker RN, MSN, CDE ?Diabetes Coordinator ?Inpatient Glycemic Control Team ?Team Pager: [phone] (8a-5p) ? ? ? ? ?

## 2021-03-23 NOTE — Progress Notes (Signed)
PROGRESS NOTE   [name]  VOJ:[id number] DOB: [birth year] DOA: 03/22/2021 PCP: Carrolyn Meiers, MD   Chief Complaint  Patient presents with   Shortness of Breath   Level of care: Stepdown  Brief Admission History:  76 year old female with a history of paroxysmal atrial fibrillation on apixaban, hypertension, orthostatic hypotension, anxiety/depression, diabetes mellitus type 2, hyperlipidemia, CVA, chronic pain, schizophrenia, GERD presenting with 2-day history of coughing and shortness of breath from high [place].  History is limited secondary to patient being in extremis.  Upon EMS arrival, the patient was noted to have oxygen saturation 80% on room air.  She was noted to have atrial fibrillation with RVR.  The patient denies any chest pain but complains of shortness of breath and coughing.  There is no history of nausea, vomiting, diarrhea, domino pain. In the ED, the patient had a fever up to 101.7 F with tachycardia initially in the 170s.  She was hemodynamically stable.  She was placed on BiPAP.  She was started on vancomycin, cefepime, metronidazole.  Chest x-ray showed bilateral interstitial and airspace opacities.  Lactic acid was 2.5 >>2.1.  Sodium 135, potassium 3.8, bicarbonate 27, serum creatinine 0.75.  LFTs were unremarkable.  WBC 14.0, hemoglobin 12.0, platelets 295,000.  03/23/2021:  pt taken off bipap in AM, on nasal cannula.  Cardiology stopped IV diltiazem and lopressor and started IV amiodarone.     Assessment and Plan: * Acute respiratory failure with hypoxia ([place])- (present on admission) Secondary to pneumonia Currently on  BiPAP--wean as tolerated back to RA Obtain VBG  Sepsis due to undetermined organism ([place]) SEPSIS PHYSIOLOGY RESOLVED NOW Present on admission Due to pneumonia Presents with fever, leukocytosis and respiratory failure Lactic 2.5>>2.1 Continue IVF Continue IV abx pending culture Follow blood culture   Atrial fibrillation with RVR  ([place])- (present on admission) IV amiodarone per cardiology starting 03/23/21  Paroxysmal atrial fibrillation ([place]) Currently in RVR Has had a history of soft blood pressures with difficult to control RVR Continue diltiazem drip Start IV metoprolol in lieu of her baseline metoprolol succinate dose Continue midodrine Repeat echo   Lobar pneumonia (HCC) Continue vancomycin, cefepime Add azithromycin MRSA screen PCT  Schizophrenia ([place])- (present on admission) Continue Abilify, Risperdal, Remeron, Cymbalta  Orthostasis- (present on admission) Continue midodrine  Mixed hyperlipidemia- (present on admission) Continue statin  Essential hypertension, benign- (present on admission) Cardiology team discontinued IV diltiazem and IV metoprolol temporarily while on IV amiodarone  Uncontrolled type 2 diabetes mellitus with hyperglycemia, with long-term current use of insulin (HCC) Semglee 14 units plus novolog 2 unit TIDAC, CBG 5 times per day NovoLog sliding scale hemoglobin A1c pending  CBG (last 3)  Recent Labs    03/23/21 0730 03/23/21 1151 03/23/21 1612  GLUCAP 221* 196* 198*     DVT prophylaxis: apixaban  Code Status: full  Family Communication:  Disposition: Status is: Inpatient Remains inpatient appropriate because: IV abx, IV amiodarone infusion  Consultants:  Cardiology  Procedures:   Antimicrobials:  Cefepime 3/1>>  Subjective: Pt reports ongoing SOB but wanting to eat something.  No CP.  No coughing.   Objective: Vitals:   03/23/21 1500 03/23/21 1600 03/23/21 1608 03/23/21 1638  BP: 115/66 (!) 111/57    Pulse: 91 (!) 49  86  Resp: (!) 23 (!) 28  (!) 34  Temp:   97.8 F (36.6 C)   TempSrc:   Oral   SpO2: 97% 99%  97%  Weight:      Height:  Intake/Output Summary (Last 24 hours) at 03/23/2021 1700 Last data filed at 03/23/2021 1648 Gross per 24 hour  Intake 2835.77 ml  Output 250 ml  Net 2585.77 ml   Filed Weights   03/22/21 0845 03/22/21  2118 03/23/21 0440  Weight: 63 kg 62.8 kg 67.1 kg   Examination:  General exam: Appears calm and comfortable  Respiratory system: shallow breathing bilateral with rales heard LLL Cardiovascular system: normal S1 & S2 heard. No JVD, murmurs, rubs, gallops or clicks. No pedal edema. Gastrointestinal system: Abdomen is nondistended, soft and nontender. No organomegaly or masses felt. Normal bowel sounds heard. Central nervous system: Alert and oriented. No focal neurological deficits. Extremities: Symmetric 5 x 5 power. Skin: No rashes, lesions or ulcers. Psychiatry: Judgement and insight appear poor. Mood & affect flat.   Data Reviewed: I have personally reviewed following labs and imaging studies  CBC: Recent Labs  Lab 03/22/21 0938 03/23/21 0401  WBC 14.0* 13.4*  NEUTROABS 12.9*  --   HGB 12.0 9.8*  HCT 37.8 30.8*  MCV 89.2 90.1  PLT 295 716    Basic Metabolic Panel: Recent Labs  Lab 03/22/21 0938 03/23/21 0401  NA 135 136  K 3.8 4.2  CL 96* 103  CO2 27 26  GLUCOSE 250* 240*  BUN 14 14  CREATININE 0.75 0.64  CALCIUM 8.6* 8.0*    CBG: Recent Labs  Lab 03/22/21 2234 03/23/21 0730 03/23/21 1151 03/23/21 1612  GLUCAP 190* 221* 196* 198*    Recent Results (from the past 240 hour(s))  Resp Panel by RT-PCR (Flu A&B, Covid) Nasopharyngeal Swab     Status: None   Collection Time: 03/22/21  9:07 AM   Specimen: Nasopharyngeal Swab; Nasopharyngeal(NP) swabs in vial transport medium  Result Value Ref Range Status   SARS Coronavirus 2 by RT PCR NEGATIVE NEGATIVE Final    Comment: (NOTE) SARS-CoV-2 target nucleic acids are NOT DETECTED.  The SARS-CoV-2 RNA is generally detectable in upper respiratory specimens during the acute phase of infection. The lowest concentration of SARS-CoV-2 viral copies this assay can detect is 138 copies/mL. A negative result does not preclude SARS-Cov-2 infection and should not be used as the sole basis for treatment or other patient  management decisions. A negative result may occur with  improper specimen collection/handling, submission of specimen other than nasopharyngeal swab, presence of viral mutation(s) within the areas targeted by this assay, and inadequate number of viral copies(<138 copies/mL). A negative result must be combined with clinical observations, patient history, and epidemiological information. The expected result is Negative.  Fact Sheet for Patients:  EntrepreneurPulse.com.au  Fact Sheet for Healthcare Providers:  IncredibleEmployment.be  This test is no t yet approved or cleared by the [place] FDA and  has been authorized for detection and/or diagnosis of SARS-CoV-2 by FDA under an Emergency Use Authorization (EUA). This EUA will remain  in effect (meaning this test can be used) for the duration of the COVID-19 declaration under Section 564(b)(1) of the Act, 21 U.S.C.section 360bbb-3(b)(1), unless the authorization is terminated  or revoked sooner.       Influenza A by PCR NEGATIVE NEGATIVE Final   Influenza B by PCR NEGATIVE NEGATIVE Final    Comment: (NOTE) The Xpert Xpress SARS-CoV-2/FLU/RSV plus assay is intended as an aid in the diagnosis of influenza from Nasopharyngeal swab specimens and should not be used as a sole basis for treatment. Nasal washings and aspirates are unacceptable for Xpert Xpress SARS-CoV-2/FLU/RSV testing.  Fact Sheet for Patients: EntrepreneurPulse.com.au  Fact Sheet for Healthcare Providers: IncredibleEmployment.be  This test is not yet approved or cleared by the [place] FDA and has been authorized for detection and/or diagnosis of SARS-CoV-2 by FDA under an Emergency Use Authorization (EUA). This EUA will remain in effect (meaning this test can be used) for the duration of the COVID-19 declaration under Section 564(b)(1) of the Act, 21 U.S.C. section 360bbb-3(b)(1),  unless the authorization is terminated or revoked.  Performed at [facility], [address], [place], [place] [address]   Blood Culture (routine x 2)     Status: None (Preliminary result)   Collection Time: 03/22/21  9:19 AM   Specimen: BLOOD  Result Value Ref Range Status   Specimen Description BLOOD BLOOD RIGHT HAND  Final   Special Requests   Final    BOTTLES DRAWN AEROBIC AND ANAEROBIC Blood Culture results may not be optimal due to an inadequate volume of blood received in culture bottles   Culture   Final    NO GROWTH < 24 HOURS Performed at [facility], [address] [place], [place] [address]    Report Status PENDING  Incomplete  Blood Culture (routine x 2)     Status: None (Preliminary result)   Collection Time: 03/22/21  9:38 AM   Specimen: BLOOD  Result Value Ref Range Status   Specimen Description BLOOD LEFT ANTECUBITAL  Final   Special Requests   Final    Blood Culture adequate volume BOTTLES DRAWN AEROBIC AND ANAEROBIC   Culture   Final    NO GROWTH < 24 HOURS Performed at [facility], [address] [place], [place] [address]    Report Status PENDING  Incomplete  MRSA Next Gen by PCR, Nasal     Status: None   Collection Time: 03/22/21  9:24 PM   Specimen: Nasal Mucosa; Nasal Swab  Result Value Ref Range Status   MRSA by PCR Next Gen NOT DETECTED NOT DETECTED Final    Comment: (NOTE) The GeneXpert MRSA Assay (FDA approved for NASAL specimens only), is one component of a comprehensive MRSA colonization surveillance program. It is not intended to diagnose MRSA infection nor to guide or monitor treatment for MRSA infections. Test performance is not FDA approved in patients less than 51 years old. Performed at [facility], [address], [place], [place] [address]      Radiology Studies: [facility] Chest [facility] 1 View  Result Date: 03/22/2021 CLINICAL DATA:  Questionable sepsis. EXAM: PORTABLE CHEST 1 VIEW COMPARISON:  03/09/2021 FINDINGS: Patchy interstitial and  alveolar airspace opacities throughout bilateral lungs concerning for multilobar pneumonia including atypical viral pneumonia. No pleural effusion or pneumothorax. Stable cardiomegaly. No acute osseous abnormality. IMPRESSION: 1. Patchy interstitial and alveolar airspace opacities throughout bilateral lungs concerning for multilobar pneumonia including atypical viral pneumonia. Electronically Signed   By: Kathreen Devoid M.D.   On: 03/22/2021 09:29    Scheduled Meds:  apixaban  5 mg Oral BID   ARIPiprazole  2 mg Oral Daily   atorvastatin  40 mg Oral Daily   Chlorhexidine Gluconate Cloth  6 each Topical Daily   DULoxetine  120 mg Oral Daily   gabapentin  300 mg Oral TID   insulin aspart  0-9 Units Subcutaneous TID WC   insulin aspart  2 Units Subcutaneous TID WC   insulin glargine-yfgn  14 Units Subcutaneous QHS   ipratropium-albuterol  3 mL Nebulization Q6H   midodrine  10 mg Oral TID   mirabegron ER  25 mg Oral  Daily   mirtazapine  30 mg Oral QHS   pantoprazole  40 mg Oral BID AC   risperiDONE  1 mg Oral BID   Continuous Infusions:  0.9 % NaCl with KCl 20 mEq / L 75 mL/hr at 03/23/21 1651   amiodarone 30 mg/hr (03/23/21 1529)   azithromycin Stopped (03/23/21 1412)   ceFEPime (MAXIPIME) IV 200 mL/hr at 03/23/21 1529     LOS: 1 day   Time spent: 82 mins  Arvid Marengo Wynetta Emery, MD How to contact the [facility] Attending or Consulting provider [place] or covering provider during after hours [place], for this patient?  Check the care team in [facility] and look for a) attending/consulting TRH provider listed and b) the [facility] team listed Log into [email or web address] and use [place]'s universal password to access. If you do not have the password, please contact the hospital operator. Locate the [facility] provider you are looking for under [facility] and page to a number that you can be directly reached. If you still have difficulty reaching the provider, please page the [facility] (Director on Call) for the  Hospitalists listed on amion for assistance.  03/23/2021, 5:00 PM

## 2021-03-23 NOTE — TOC Initial Note (Signed)
Transition of Care (TOC) - Initial/Assessment Note  ? ? ?Patient Details  ?Name: [name] ?MRN: [id number] ?Date of Birth: [birth year] ? ?Transition of Care (TOC) CM/SW Contact:    ?Iona Beard, LCSWA ?Phone Number: ?03/23/2021, 2:17 PM ? ?Clinical Narrative:                 ?CSW spoke to [place] with [employer] who states that pt needs limited assistance with ADLs. Plan will be for return to ALF at D/C. CSW started fl2. Pts legal guardian is [address] Othella Boyer [phone]. TOC to follow.  ? ?Expected Discharge Plan: Assisted Living ?Barriers to Discharge: Continued Medical Work up ? ? ?Patient Goals and CMS Choice ?Patient states their goals for this hospitalization and ongoing recovery are:: Return to ALF ?CMS Medicare.gov Compare Post Acute Care list provided to:: Legal Guardian ?  ? ?Expected Discharge Plan and Services ?Expected Discharge Plan: Assisted Living ?In-house Referral: Clinical Social Work ?Discharge Planning Services: CM Consult ?Post Acute Care Choice: Resumption of Svcs/PTA Provider ?Living arrangements for the past 2 months: [place] ?                ?  ?  ?  ?  ?  ?  ?  ?  ?  ?  ? ?Prior Living Arrangements/Services ?Living arrangements for the past 2 months: [place] ?Lives with:: Facility Resident ?Patient language and need for interpreter reviewed:: Yes ?Do you feel safe going back to the place where you live?: Yes      ?Need for Family Participation in Patient Care: Yes (Comment) ?Care giver support system in place?: Yes (comment) ?  ?Criminal Activity/Legal Involvement Pertinent to Current Situation/Hospitalization: No - Comment as needed ? ?Activities of Daily Living ?Home Assistive Devices/Equipment: Gilford Rile (specify type), Wheelchair ?ADL Screening (condition at time of admission) ?Patient's cognitive ability adequate to safely complete daily activities?: Yes ?Is the patient deaf or have difficulty hearing?: No ?Does the  patient have difficulty seeing, even when wearing glasses/contacts?: No ?Does the patient have difficulty concentrating, remembering, or making decisions?: Yes ?Patient able to express need for assistance with ADLs?: Yes ?Does the patient have difficulty dressing or bathing?: Yes ?Independently performs ADLs?: No ?Communication: Needs assistance ?Is this a change from baseline?: Pre-admission baseline ?Dressing (OT): Needs assistance ?Is this a change from baseline?: Pre-admission baseline ?Grooming: Needs assistance ?Is this a change from baseline?: Pre-admission baseline ?Feeding: Needs assistance ?Is this a change from baseline?: Pre-admission baseline ?Bathing: Needs assistance ?Is this a change from baseline?: Pre-admission baseline ?Toileting: Needs assistance ?Is this a change from baseline?: Pre-admission baseline ?In/Out Bed: Needs assistance ?Is this a change from baseline?: Pre-admission baseline ?Walks in Home: Needs assistance ?Is this a change from baseline?: Pre-admission baseline ?Does the patient have difficulty walking or climbing stairs?: Yes ?Weakness of Legs: Both ?Weakness of Arms/Hands: None ? ?Permission Sought/Granted ?  ?  ?   ?   ?   ?   ? ?Emotional Assessment ?Appearance:: Appears stated age ?Attitude/Demeanor/Rapport: Engaged ?Affect (typically observed): Accepting ?Orientation: : Oriented to Self, Oriented to Place, Oriented to  Time, Oriented to Situation ?Alcohol / Substance Use: Not Applicable ?Psych Involvement: No (comment) ? ?Admission diagnosis:  Acute respiratory failure with hypoxia ([place]) [J96.01] ?Patient Active Problem List  ? Diagnosis Date Noted  ? Acute respiratory failure with hypoxia ([place]) 03/22/2021  ? Lobar pneumonia ([place]) 03/22/2021  ? Sepsis due to undetermined organism ([place]) 03/22/2021  ? Paroxysmal atrial fibrillation (  [place]) 03/22/2021  ? Nausea without vomiting 11/17/2020  ? Hyperthyroidism   ? Hypokalemia   ? SBO (small bowel obstruction) ([place]) 04/18/2020  ?  Chest pain 02/21/2020  ? Hemorrhoids 11/26/2019  ? GERD (gastroesophageal reflux disease) 08/26/2019  ? Loss of weight 08/26/2019  ? Constipation 09/09/2018  ? Hypotension 03/21/2018  ? Diarrhea 07/20/2017  ? Primary osteoarthritis of right hip 04/18/2017  ? Orthostasis 03/28/2017  ? General weakness 03/28/2017  ? History of cerebrovascular accident (CVA) with residual deficit 03/28/2017  ? Schizophrenia ([place]) 03/28/2017  ? Generalized weakness 03/28/2017  ? Orthostatic hypotension   ? IBS (irritable bowel syndrome) 10/16/2016  ? Left hemiparesis ([place]) 10/06/2016  ? Left-sided weakness 10/06/2016  ? Chronic superficial gastritis without bleeding   ? Stricture and stenosis of esophagus   ? Nausea with vomiting 04/20/2016  ? Abdominal pain, acute, bilateral lower quadrant 03/22/2016  ? Lesion of right lobe of liver 03/22/2016  ? Elevated troponin 02/13/2016  ? IDA (iron deficiency anemia) 04/06/2015  ? Mixed hyperlipidemia 11/06/2014  ? Obesity due to excess calories 11/06/2014  ? Sedentary lifestyle 11/06/2014  ? Rectal bleeding 10/21/2014  ? Uncontrolled type 2 diabetes mellitus with hyperglycemia, with long-term current use of insulin ([place]) 03/24/2010  ? Essential hypertension, benign 03/24/2010  ? DVT, lower extremity, recurrent ([place]) 03/24/2010  ? History of cardiovascular disorder 03/24/2010  ? ?PCP:  Carrolyn Meiers, MD ?Pharmacy:   ?[place], [address] [address] ?Phone: [phone] Fax: [phone] ? ? ? ? ?Social Determinants of Health (SDOH) Interventions ?  ? ?Readmission Risk Interventions ?Readmission Risk Prevention Plan 03/23/2021  ?Transportation Screening Complete  ?T[place] or Home Care Consult Complete  ?Social Work Consult for [place] Planning/Counseling Complete  ?Palliative Care Screening Not Applicable  ?Medication Review Press photographer) Complete  ?Some recent data might be hidden  ? ? ? ?

## 2021-03-23 NOTE — Assessment & Plan Note (Addendum)
IV amiodarone per cardiology starting 03/23/21, dig added 3/2 ?Additional IV bolus ordered on 3/3 by Dr. Harl Bowie ?Digoxin initially started by cardiology.   ?( brief period off IV amio on 3/4 due to lost IV access) PICC line now in place ?Remained on IV amiodarone and oral cardizem ?Oral metoprolol added, IV>>po amio on 3/8 ?See above for regimen at time of d/c ?

## 2021-03-23 NOTE — Progress Notes (Signed)
RT took patient off of BIPAP and placed her on 4L nasal cannula. Patients sats are 100% and all other vitals are within normal limits. Patient tolerating well at this time. Will continue to monitor as needed. ?

## 2021-03-23 NOTE — Consult Note (Signed)
Cardiology Consultation:   Patient ID: [name] MRN: [id number]; DOB: Sep 05, 1945  Admit date: 03/22/2021 Date of Consult: 03/23/2021  PCP:  Carrolyn Meiers, MD   [facility] Providers Cardiologist:  Rozann Lesches, MD        Patient Profile:   [name] is a 76 y.o. female with a hx of PAF who is being seen 03/23/2021 for the evaluation of tachycardia  at the request of Dr Tat.  History of Present Illness:   [name] 76 yo female history of PAF, orthostatic hypotension on midodrine, wheel chair bound, history of prior CVA, DM2, admitted with SOB and cough. Diagnosed with pneumonia being managed by primary team. In ER noted to be in afib with RVR, cardiology consulted to help manage   Lacrtic acid 2.5-->2.1 K 3.8 BUN 14 Cr 0.75 WBC 14 Hgb 12 Plt 295 Procalc 0.92 BNP 365  COVID neg Flu neg CXR patchy interstiail and alveolar airspace opacities, suggesting multilobar pneumonia EKG afib 104   09/2019 echo: LVE 03-50%, diastolic not reported  Past Medical History:  Diagnosis Date   Anxiety    Arthritis    Atrial fibrillation (HCC)    Atrial flutter ([place])    Collagen vascular disease ([place])    COVID-19 virus infection    COVID-19+ approx 01/24/19; asymptomatic course with full recovery   Dependence on wheelchair    pivot/transfers   Depression    History of psychosis and previous suicide attempt   DVT, lower extremity, recurrent ([place])    Long-term Coumadin per Dr. Legrand Rams   Essential hypertension    GERD (gastroesophageal reflux disease)    Hemiplegia ([place]) 2010   Left side   History of stroke    Acute infarct and right cerebral white matter small vessel disease 12/10   Leg DVT (deep venous thromboembolism), acute ([place]) 2006   Orthostatic hypotension    Schizophrenia ([place])    Stroke ([place])    left sided weakness   Type 2 diabetes mellitus ([place])     Past Surgical History:  Procedure Laterality Date   BACK SURGERY     BIOPSY N/A 11/24/2014    Procedure: BIOPSY;  Surgeon: Danie Binder, MD;  Location: [facility];  Service: Endoscopy;  Laterality: N/A;   BIOPSY  05/17/2016   Procedure: BIOPSY;  Surgeon: Danie Binder, MD;  Location: [facility];  Service: Endoscopy;;  gastric biopsy   COLONOSCOPY WITH PROPOFOL N/A 11/24/2014   Dr. Rudie Meyer polyps removed/moderate sized internal hemorrhoids, tubular adenomas. Next surveillance in 3 years   ESOPHAGOGASTRODUODENOSCOPY (EGD) WITH PROPOFOL N/A 11/24/2014   Dr. Clayburn Pert HH/patent stricture at the gastroesophageal junction, mild non-erosive gastritis, path negative for H.pylori or celiac sprue   ESOPHAGOGASTRODUODENOSCOPY (EGD) WITH PROPOFOL N/A 05/17/2016   Procedure: ESOPHAGOGASTRODUODENOSCOPY (EGD) WITH PROPOFOL;  Surgeon: Danie Binder, MD;  Location: [facility];  Service: Endoscopy;  Laterality: N/A;  12:45pm   GIVENS CAPSULE STUDY N/A 12/11/2014   MULTILPLE EROSION IN the stomach WITH ACTIVE OOZING. OCCASIONAL EROSIONS AND RARE ULCER SEEN IN PROXIMAL SMALL BOWEL . No masses or AVMs SEEN. NO OLD BLOOD OR FRESH BLOOD SEEN.    POLYPECTOMY N/A 11/24/2014   Procedure: POLYPECTOMY;  Surgeon: Danie Binder, MD;  Location: [facility];  Service: Endoscopy;  Laterality: N/A;   SAVORY DILATION N/A 05/17/2016   Procedure: SAVORY DILATION;  Surgeon: Danie Binder, MD;  Location: [facility];  Service: Endoscopy;  Laterality: N/A;      Inpatient Medications:  Scheduled Meds:  apixaban  5 mg Oral BID   ARIPiprazole  2 mg Oral Daily   atorvastatin  40 mg Oral Daily   Chlorhexidine Gluconate Cloth  6 each Topical Daily   DULoxetine  120 mg Oral Daily   gabapentin  300 mg Oral TID   ipratropium-albuterol  3 mL Nebulization Q6H   metoprolol tartrate  5 mg Intravenous Q6H   midodrine  10 mg Oral TID   mirabegron ER  25 mg Oral Daily   mirtazapine  30 mg Oral QHS   pantoprazole  40 mg Oral BID AC   risperiDONE  1 mg Oral BID   Continuous Infusions:  0.9 % NaCl with KCl 20 mEq / L 100 mL/hr at  03/23/21 0446   azithromycin Stopped (03/22/21 1600)   ceFEPime (MAXIPIME) IV 2 g (03/22/21 2331)   diltiazem (CARDIZEM) infusion 15 mg/hr (03/23/21 0043)   vancomycin     PRN Meds: acetaminophen **OR** acetaminophen, HYDROcodone-acetaminophen, hydrOXYzine, LORazepam, ondansetron **OR** ondansetron (ZOFRAN) IV  Allergies:    Allergies  Allergen Reactions   Sulfa Antibiotics Rash    Social History:   Social History   Socioeconomic History   Marital status: Widowed    Spouse name: Not on file   Number of children: Not on file   Years of education: Not on file   Highest education level: Not on file  Occupational History   Not on file  Tobacco Use   Smoking status: Former    Packs/day: 0.25    Years: 20.00    Pack years: 5.00    Types: Cigarettes    Quit date: 01/24/1995    Years since quitting: 26.1   Smokeless tobacco: Never  Vaping Use   Vaping Use: Never used  Substance and Sexual Activity   Alcohol use: No    Alcohol/week: 0.0 standard drinks   Drug use: No   Sexual activity: Never    Birth control/protection: Post-menopausal  Other Topics Concern   Not on file  Social History Narrative   Not on file   Social Determinants of Health   Financial Resource Strain: Not on file  Food Insecurity: Not on file  Transportation Needs: Not on file  Physical Activity: Not on file  Stress: Not on file  Social Connections: Not on file  Intimate Partner Violence: Not on file    Family History:    Family History  Problem Relation Age of Onset   Hypertension Mother    Colon cancer Neg Hx      ROS:  Please see the history of present illness.   All other ROS reviewed and negative.     Physical Exam/Data:   Vitals:   03/23/21 0700 03/23/21 0724 03/23/21 0745 03/23/21 0800  BP:  (!) 86/50 (!) 93/48 (!) 100/45  Pulse:  85 89 88  Resp:  20 (!) 23 16  Temp: 98.4 F (36.9 C)     TempSrc: Axillary     SpO2:  99% 99% 99%  Weight:      Height:         Intake/Output Summary (Last 24 hours) at 03/23/2021 0818 Last data filed at 03/22/2021 1600 Gross per 24 hour  Intake 1007.2 ml  Output --  Net 1007.2 ml   Last 3 Weights 03/23/2021 03/22/2021 03/22/2021  Weight (lbs) 147 lb 14.9 oz 138 lb 7.2 oz 138 lb 14.2 oz  Weight (kg) 67.1 kg 62.8 kg 63 kg  Some encounter information is confidential and restricted. Go  to Review Flowsheets activity to see all data.     Body mass index is 23.88 kg/m.  General:  Well nourished, well developed, in no acute distress HEENT: normal Neck: no JVD Vascular: No carotid bruits; Distal pulses 2+ bilaterally Cardiac:  irreg, 2/6 systolic murmur apex Lungs:  coarse bilaterally Abd: soft, nontender, no hepatomegaly  Ext: no edema Musculoskeletal:  No deformities, BUE and BLE strength normal and equal Skin: warm and dry  Neuro:  CNs 2-12 intact, no focal abnormalities noted Psych:  Normal affect     Laboratory Data:  High Sensitivity Troponin:   Recent Labs  Lab 03/09/21 1346 03/09/21 1527  TROPONINIHS 7 7     Chemistry Recent Labs  Lab 03/22/21 0938 03/23/21 0401  NA 135 136  K 3.8 4.2  CL 96* 103  CO2 27 26  GLUCOSE 250* 240*  BUN 14 14  CREATININE 0.75 0.64  CALCIUM 8.6* 8.0*  GFRNONAA >60 >60  ANIONGAP 12 7    Recent Labs  Lab 03/22/21 0938 03/23/21 0401  PROT 7.6 6.0*  ALBUMIN 3.4* 2.5*  AST 15 11*  ALT 12 11  ALKPHOS 99 66  BILITOT 0.7 0.9   Lipids No results for input(s): CHOL, TRIG, HDL, LABVLDL, LDLCALC, CHOLHDL in the last 168 hours.  Hematology Recent Labs  Lab 03/22/21 0938 03/23/21 0401  WBC 14.0* 13.4*  RBC 4.24 3.42*  HGB 12.0 9.8*  HCT 37.8 30.8*  MCV 89.2 90.1  MCH 28.3 28.7  MCHC 31.7 31.8  RDW 13.5 13.5  PLT 295 253   Thyroid No results for input(s): TSH, FREET4 in the last 168 hours.  BNP Recent Labs  Lab 03/22/21 0938  BNP 365.0*    DDimer No results for input(s): DDIMER in the last 168 hours.   Radiology/Studies:  [facility] Chest Port 1  View  Result Date: 03/22/2021 CLINICAL DATA:  Questionable sepsis. EXAM: PORTABLE CHEST 1 VIEW COMPARISON:  03/09/2021 FINDINGS: Patchy interstitial and alveolar airspace opacities throughout bilateral lungs concerning for multilobar pneumonia including atypical viral pneumonia. No pleural effusion or pneumothorax. Stable cardiomegaly. No acute osseous abnormality. IMPRESSION: 1. Patchy interstitial and alveolar airspace opacities throughout bilateral lungs concerning for multilobar pneumonia including atypical viral pneumonia. Electronically Signed   By: Kathreen Devoid M.D.   On: 03/22/2021 09:29     Assessment and Plan:   1.Pneumonia/Hypoxia - per primary team, initially on bipap now weaned to [place]   2. PAF - presented with afib with RVR in setting of pneumonia, hypoxia - historically rate control has been affected by low bp's as outpatient - home regimen dilt 120, toprol 75, eliquis 5mg  bid -currently on dilt gtt 12.5, IV lopressor 5mg  every 6 hours. MAPs high 50s to low 60s, rates currently controlled.  - Given low bp's will plan for short course of IV amiodarone, afib should improve as systemic illness resolves and overtime would be able to get back to her home regimen.D/c IV dilt and IV lopressor, start IV amiodarone.  -continue eliquis for stroke prevention   Risk Assessment/Risk Scores:    For questions or updates, please contact [place] Please consult [email or web address] for contact info under    Signed, Carlyle Dolly, MD  03/23/2021 8:18 AM

## 2021-03-24 DIAGNOSIS — J9601 Acute respiratory failure with hypoxia: Secondary | ICD-10-CM | POA: Diagnosis not present

## 2021-03-24 LAB — BASIC METABOLIC PANEL
Anion gap: 7 (ref 5–15)
BUN: 10 mg/dL (ref 8–23)
CO2: 25 mmol/L (ref 22–32)
Calcium: 8 mg/dL — ABNORMAL LOW (ref 8.9–10.3)
Chloride: 103 mmol/L (ref 98–111)
Creatinine, Ser: 0.59 mg/dL (ref 0.44–1.00)
GFR, Estimated: >60 mL/min (ref >60)
Glucose, Bld: 218 mg/dL — ABNORMAL HIGH (ref 70–99)
Potassium: 4 mmol/L (ref 3.5–5.1)
Sodium: 135 mmol/L (ref 135–145)

## 2021-03-24 LAB — CBC WITH DIFFERENTIAL/PLATELET
Abs Immature Granulocytes: 0.09 10*3/uL — ABNORMAL HIGH (ref 0.00–0.07)
Basophils Absolute: 0 10*3/uL (ref 0.0–0.1)
Basophils Relative: 0 %
Eosinophils Absolute: 0.2 10*3/uL (ref 0.0–0.5)
Eosinophils Relative: 2 %
HCT: 30.7 % — ABNORMAL LOW (ref 36.0–46.0)
Hemoglobin: 9.3 g/dL — ABNORMAL LOW (ref 12.0–15.0)
Immature Granulocytes: 1 %
Lymphocytes Relative: 8 %
Lymphs Abs: 1 10*3/uL (ref 0.7–4.0)
MCH: 27.8 pg (ref 26.0–34.0)
MCHC: 30.3 g/dL (ref 30.0–36.0)
MCV: 91.9 fL (ref 80.0–100.0)
Monocytes Absolute: 0.8 10*3/uL (ref 0.1–1.0)
Monocytes Relative: 7 %
Neutro Abs: 9.7 10*3/uL — ABNORMAL HIGH (ref 1.7–7.7)
Neutrophils Relative %: 82 %
Platelets: 277 10*3/uL (ref 150–400)
RBC: 3.34 MIL/uL — ABNORMAL LOW (ref 3.87–5.11)
RDW: 13.5 % (ref 11.5–15.5)
WBC: 11.7 10*3/uL — ABNORMAL HIGH (ref 4.0–10.5)
nRBC: 0 % (ref 0.0–0.2)

## 2021-03-24 LAB — GLUCOSE, CAPILLARY
Glucose-Capillary: 195 mg/dL — ABNORMAL HIGH (ref 70–99)
Glucose-Capillary: 256 mg/dL — ABNORMAL HIGH (ref 70–99)
Glucose-Capillary: 173 mg/dL — ABNORMAL HIGH (ref 70–99)
Glucose-Capillary: 164 mg/dL — ABNORMAL HIGH (ref 70–99)

## 2021-03-24 LAB — MAGNESIUM: Magnesium: 1.8 mg/dL (ref 1.7–2.4)

## 2021-03-24 MED ORDER — AMIODARONE HCL IN DEXTROSE 360-4.14 MG/200ML-% IV SOLN
60.0000 mg/h | INTRAVENOUS | Status: AC
  Administered 2021-03-24: 60 mg/h via INTRAVENOUS
  Filled 2021-03-24: qty 200

## 2021-03-24 MED ORDER — INSULIN ASPART 100 UNIT/ML IJ SOLN
3.0000 [IU] | Freq: Three times a day (TID) | INTRAMUSCULAR | Status: DC
  Administered 2021-03-24 (×2): 3 [IU] via SUBCUTANEOUS

## 2021-03-24 MED ORDER — AMIODARONE HCL IN DEXTROSE 360-4.14 MG/200ML-% IV SOLN
60.0000 mg/h | INTRAVENOUS | Status: AC
Start: 2021-03-24 — End: 2021-03-24
  Administered 2021-03-24 (×2): 60 mg/h via INTRAVENOUS
  Filled 2021-03-24: qty 200

## 2021-03-24 MED ORDER — SODIUM CHLORIDE 0.9 % IV SOLN
1.0000 g | INTRAVENOUS | Status: DC
  Administered 2021-03-24 – 2021-03-27 (×4): 1 g via INTRAVENOUS
  Filled 2021-03-24 (×4): qty 10

## 2021-03-24 MED ORDER — AMIODARONE HCL IN DEXTROSE 360-4.14 MG/200ML-% IV SOLN
60.0000 mg/h | INTRAVENOUS | Status: DC
  Administered 2021-03-24: 60 mg/h via INTRAVENOUS
  Filled 2021-03-24: qty 200

## 2021-03-24 MED ORDER — DIGOXIN 125 MCG PO TABS
0.1250 mg | ORAL_TABLET | Freq: Every day | ORAL | Status: DC
  Administered 2021-03-24 – 2021-03-30 (×7): 0.125 mg via ORAL
  Filled 2021-03-24 (×7): qty 1

## 2021-03-24 MED ORDER — METOPROLOL TARTRATE 5 MG/5ML IV SOLN
2.5000 mg | Freq: Once | INTRAVENOUS | Status: AC
Start: 2021-03-24 — End: 2021-03-24
  Administered 2021-03-24: 2.5 mg via INTRAVENOUS
  Filled 2021-03-24: qty 5

## 2021-03-24 MED ORDER — AMIODARONE HCL IN DEXTROSE 360-4.14 MG/200ML-% IV SOLN
30.0000 mg/h | INTRAVENOUS | Status: DC
Start: 2021-03-24 — End: 2021-03-30
  Administered 2021-03-25 – 2021-03-30 (×9): 30 mg/h via INTRAVENOUS
  Filled 2021-03-24 (×12): qty 200

## 2021-03-24 NOTE — Progress Notes (Signed)
Pt again having afib RVR rate uncontrolled 110-150s.  Dr. Clearence Ped notified, EKG captured.  Order given. ?

## 2021-03-24 NOTE — Progress Notes (Signed)
PROGRESS NOTE   [name]  KGM:[id number] DOB: [birth year] DOA: 03/22/2021 PCP: Carrolyn Meiers, MD   Chief Complaint  Patient presents with   Shortness of Breath   Level of care: Stepdown  Brief Admission History:  76 year old female with a history of paroxysmal atrial fibrillation on apixaban, hypertension, orthostatic hypotension, anxiety/depression, diabetes mellitus type 2, hyperlipidemia, CVA, chronic pain, schizophrenia, GERD presenting with 2-day history of coughing and shortness of breath from high [place].  History is limited secondary to patient being in extremis.  Upon EMS arrival, the patient was noted to have oxygen saturation 80% on room air.  She was noted to have atrial fibrillation with RVR.  The patient denies any chest pain but complains of shortness of breath and coughing.  There is no history of nausea, vomiting, diarrhea, domino pain. In the ED, the patient had a fever up to 101.7 F with tachycardia initially in the 170s.  She was hemodynamically stable.  She was placed on BiPAP.  She was started on vancomycin, cefepime, metronidazole.  Chest x-ray showed bilateral interstitial and airspace opacities.  Lactic acid was 2.5 >>2.1.  Sodium 135, potassium 3.8, bicarbonate 27, serum creatinine 0.75.  LFTs were unremarkable.  WBC 14.0, hemoglobin 12.0, platelets 295,000.  03/23/2021:  pt taken off bipap in AM, on nasal cannula.  Cardiology stopped IV diltiazem and lopressor and started IV amiodarone.    03/24/2021: Afib RVR persists overnight, remains on IV amiodarone.  Cardio added digoxin.  Still with dyspnea symptoms and palpitations.     Assessment and Plan: * Acute respiratory failure with hypoxia (HCC) Secondary to pneumonia Currently on  BiPAP--wean as tolerated back to RA Obtain VBG  Sepsis due to undetermined organism ([place]) SEPSIS PHYSIOLOGY RESOLVED NOW Present on admission Due to pneumonia Presents with fever, leukocytosis and respiratory  failure Lactic 2.5>>2.1 Continue IVF Continue IV abx pending culture Follow blood culture   Atrial fibrillation with RVR (HCC) IV amiodarone per cardiology starting 03/23/21, dig added 3/2  Paroxysmal atrial fibrillation ([place]) Currently in RVR Has had a history of soft blood pressures with difficult to control RVR Continue diltiazem drip Start IV metoprolol in lieu of her baseline metoprolol succinate dose Continue midodrine Repeat echo   Lobar pneumonia (HCC) Continue vancomycin, cefepime Add azithromycin MRSA screen PCT  Schizophrenia ([place]) Continue Abilify, Risperdal, Remeron, Cymbalta  Orthostasis Continue midodrine  Mixed hyperlipidemia Continue statin  Essential hypertension, benign Cardiology team discontinued IV diltiazem and IV metoprolol temporarily while on IV amiodarone  Uncontrolled type 2 diabetes mellitus with hyperglycemia, with long-term current use of insulin (HCC) Semglee 14 units plus novolog 2 unit TIDAC, CBG 5 times per day NovoLog sliding scale hemoglobin A1c pending  CBG (last 3)  Recent Labs    03/24/21 0742 03/24/21 1142 03/24/21 1634  GLUCAP 195* 256* 173*     DVT prophylaxis: apixaban  Code Status: full  Family Communication:  Disposition: Status is: Inpatient Remains inpatient appropriate because: IV abx, IV amiodarone infusion  Consultants:  Cardiology  Procedures:   Antimicrobials:  Cefepime 3/1>>  Subjective: Pt reports ongoing SOB but wanting to eat something.  No CP.  No coughing.   Objective: Vitals:   03/24/21 0900 03/24/21 1143 03/24/21 1434 03/24/21 1635  BP: 136/65     Pulse: (!) 110     Resp: (!) 21     Temp:  98.2 F (36.8 C)  98.8 F (37.1 C)  TempSrc:  Oral  Oral  SpO2: 97%  97%   Weight:  Height:        Intake/Output Summary (Last 24 hours) at 03/24/2021 1814 Last data filed at 03/24/2021 0800 Gross per 24 hour  Intake 708.16 ml  Output 230 ml  Net 478.16 ml   Filed Weights   03/22/21  2118 03/23/21 0440 03/24/21 0500  Weight: 62.8 kg 67.1 kg 68.4 kg   Examination:  General exam: Appears calm and comfortable  Respiratory system: shallow breathing bilateral with rales heard LLL Cardiovascular system: normal S1 & S2 heard. No JVD, murmurs, rubs, gallops or clicks. No pedal edema. Gastrointestinal system: Abdomen is nondistended, soft and nontender. No organomegaly or masses felt. Normal bowel sounds heard. Central nervous system: Alert and oriented. No focal neurological deficits. Extremities: Symmetric 5 x 5 power. Skin: No rashes, lesions or ulcers. Psychiatry: Judgement and insight appear poor. Mood & affect flat.   Data Reviewed: I have personally reviewed following labs and imaging studies  CBC: Recent Labs  Lab 03/22/21 0938 03/23/21 0401 03/24/21 0406  WBC 14.0* 13.4* 11.7*  NEUTROABS 12.9*  --  9.7*  HGB 12.0 9.8* 9.3*  HCT 37.8 30.8* 30.7*  MCV 89.2 90.1 91.9  PLT 295 253 097    Basic Metabolic Panel: Recent Labs  Lab 03/22/21 0938 03/23/21 0401 03/24/21 0406  NA 135 136 135  K 3.8 4.2 4.0  CL 96* 103 103  CO2 27 26 25   GLUCOSE 250* 240* 218*  BUN 14 14 10   CREATININE 0.75 0.64 0.59  CALCIUM 8.6* 8.0* 8.0*  MG  --   --  1.8    CBG: Recent Labs  Lab 03/23/21 1612 03/23/21 2211 03/24/21 0742 03/24/21 1142 03/24/21 1634  GLUCAP 198* 235* 195* 256* 173*    Recent Results (from the past 240 hour(s))  Resp Panel by RT-PCR (Flu A&B, Covid) Nasopharyngeal Swab     Status: None   Collection Time: 03/22/21  9:07 AM   Specimen: Nasopharyngeal Swab; Nasopharyngeal(NP) swabs in vial transport medium  Result Value Ref Range Status   SARS Coronavirus 2 by RT PCR NEGATIVE NEGATIVE Final    Comment: (NOTE) SARS-CoV-2 target nucleic acids are NOT DETECTED.  The SARS-CoV-2 RNA is generally detectable in upper respiratory specimens during the acute phase of infection. The lowest concentration of SARS-CoV-2 viral copies this assay can detect  is 138 copies/mL. A negative result does not preclude SARS-Cov-2 infection and should not be used as the sole basis for treatment or other patient management decisions. A negative result may occur with  improper specimen collection/handling, submission of specimen other than nasopharyngeal swab, presence of viral mutation(s) within the areas targeted by this assay, and inadequate number of viral copies(<138 copies/mL). A negative result must be combined with clinical observations, patient history, and epidemiological information. The expected result is Negative.  Fact Sheet for Patients:  EntrepreneurPulse.com.au  Fact Sheet for Healthcare Providers:  IncredibleEmployment.be  This test is no t yet approved or cleared by the [place] FDA and  has been authorized for detection and/or diagnosis of SARS-CoV-2 by FDA under an Emergency Use Authorization (EUA). This EUA will remain  in effect (meaning this test can be used) for the duration of the COVID-19 declaration under Section 564(b)(1) of the Act, 21 U.S.C.section 360bbb-3(b)(1), unless the authorization is terminated  or revoked sooner.       Influenza A by PCR NEGATIVE NEGATIVE Final   Influenza B by PCR NEGATIVE NEGATIVE Final    Comment: (NOTE) The Xpert Xpress SARS-CoV-2/FLU/RSV plus assay is intended as an  aid in the diagnosis of influenza from Nasopharyngeal swab specimens and should not be used as a sole basis for treatment. Nasal washings and aspirates are unacceptable for Xpert Xpress SARS-CoV-2/FLU/RSV testing.  Fact Sheet for Patients: EntrepreneurPulse.com.au  Fact Sheet for Healthcare Providers: IncredibleEmployment.be  This test is not yet approved or cleared by the [place] FDA and has been authorized for detection and/or diagnosis of SARS-CoV-2 by FDA under an Emergency Use Authorization (EUA). This EUA will remain in effect  (meaning this test can be used) for the duration of the COVID-19 declaration under Section 564(b)(1) of the Act, 21 U.S.C. section 360bbb-3(b)(1), unless the authorization is terminated or revoked.  Performed at [facility], [address] [place], [place] [address]   Blood Culture (routine x 2)     Status: None (Preliminary result)   Collection Time: 03/22/21  9:19 AM   Specimen: BLOOD  Result Value Ref Range Status   Specimen Description BLOOD BLOOD RIGHT HAND  Final   Special Requests   Final    BOTTLES DRAWN AEROBIC AND ANAEROBIC Blood Culture results may not be optimal due to an inadequate volume of blood received in culture bottles   Culture   Final    NO GROWTH 2 DAYS Performed at [facility], [address], [place], [place] [address]    Report Status PENDING  Incomplete  Blood Culture (routine x 2)     Status: None (Preliminary result)   Collection Time: 03/22/21  9:38 AM   Specimen: BLOOD  Result Value Ref Range Status   Specimen Description BLOOD LEFT ANTECUBITAL  Final   Special Requests   Final    Blood Culture adequate volume BOTTLES DRAWN AEROBIC AND ANAEROBIC   Culture   Final    NO GROWTH 2 DAYS Performed at [facility], [address] [place], [place] [address]    Report Status PENDING  Incomplete  MRSA Next Gen by PCR, Nasal     Status: None   Collection Time: 03/22/21  9:24 PM   Specimen: Nasal Mucosa; Nasal Swab  Result Value Ref Range Status   MRSA by PCR Next Gen NOT DETECTED NOT DETECTED Final    Comment: (NOTE) The GeneXpert MRSA Assay (FDA approved for NASAL specimens only), is one component of a comprehensive MRSA colonization surveillance program. It is not intended to diagnose MRSA infection nor to guide or monitor treatment for MRSA infections. Test performance is not FDA approved in patients less than 6 years old. Performed at [facility], [address] [place], [place] [address]      Radiology Studies: No results found.  Scheduled  Meds:  apixaban  5 mg Oral BID   ARIPiprazole  2 mg Oral Daily   atorvastatin  40 mg Oral Daily   Chlorhexidine Gluconate Cloth  6 each Topical Daily   digoxin  0.125 mg Oral Daily   DULoxetine  120 mg Oral Daily   gabapentin  300 mg Oral TID   insulin aspart  0-9 Units Subcutaneous TID WC   insulin aspart  3 Units Subcutaneous TID WC   insulin glargine-yfgn  14 Units Subcutaneous QHS   ipratropium-albuterol  3 mL Nebulization Q6H   midodrine  10 mg Oral TID   mirabegron ER  25 mg Oral Daily   mirtazapine  30 mg Oral QHS   pantoprazole  40 mg Oral BID AC   risperiDONE  1 mg Oral BID   Continuous Infusions:  0.9 % NaCl with KCl 20 mEq /  L 35 mL/hr at 03/24/21 1055   amiodarone 60 mg/hr (03/24/21 1710)   amiodarone     azithromycin 500 mg (03/24/21 1203)   cefTRIAXone (ROCEPHIN)  IV 1 g (03/24/21 1107)     LOS: 2 days   Time spent: 38 mins  Renell Allum Wynetta Emery, MD How to contact the [facility] Attending or Consulting provider [place] or covering provider during after hours [place], for this patient?  Check the care team in [facility] and look for a) attending/consulting TRH provider listed and b) the [facility] team listed Log into [email or web address] and use [place]'s universal password to access. If you do not have the password, please contact the hospital operator. Locate the [facility] provider you are looking for under [facility] and page to a number that you can be directly reached. If you still have difficulty reaching the provider, please page the [facility] (Director on Call) for the Hospitalists listed on amion for assistance.  03/24/2021, 6:14 PM

## 2021-03-24 NOTE — Progress Notes (Signed)
? ?Progress Note ? ?Patient Name: [name] ?Date of Encounter: 03/24/2021 ? ?Primary Cardiologist: Rozann Lesches, MD  ? ?Subjective  ? ?Persistent tachycardia overnight.  With midodrine pressures have done well.  Patient notes SOB and palpitations. ? ?Inpatient Medications  ?  ?Scheduled Meds: ? apixaban  5 mg Oral BID  ? ARIPiprazole  2 mg Oral Daily  ? atorvastatin  40 mg Oral Daily  ? Chlorhexidine Gluconate Cloth  6 each Topical Daily  ? DULoxetine  120 mg Oral Daily  ? gabapentin  300 mg Oral TID  ? insulin aspart  0-9 Units Subcutaneous TID WC  ? insulin aspart  3 Units Subcutaneous TID WC  ? insulin glargine-yfgn  14 Units Subcutaneous QHS  ? ipratropium-albuterol  3 mL Nebulization Q6H  ? midodrine  10 mg Oral TID  ? mirabegron ER  25 mg Oral Daily  ? mirtazapine  30 mg Oral QHS  ? pantoprazole  40 mg Oral BID AC  ? risperiDONE  1 mg Oral BID  ? ?Continuous Infusions: ? 0.9 % NaCl with KCl 20 mEq / L 75 mL/hr at 03/24/21 1025  ? azithromycin Stopped (03/23/21 1412)  ? cefTRIAXone (ROCEPHIN)  IV    ? ?PRN Meds: ?acetaminophen **OR** acetaminophen, guaiFENesin-dextromethorphan, HYDROcodone-acetaminophen, hydrOXYzine, LORazepam, ondansetron **OR** ondansetron (ZOFRAN) IV  ? ?Vital Signs  ?  ?Vitals:  ? 03/24/21 0800 03/24/21 0802 03/24/21 0831 03/24/21 0900  ?BP: (!) 129/94  (!) 112/49 136/65  ?Pulse: (!) 117  90 (!) 110  ?Resp: (!) 26  (!) 25 (!) 21  ?Temp:      ?TempSrc:      ?SpO2: 98% 99% 97% 97%  ?Weight:      ?Height:      ? ? ?Intake/Output Summary (Last 24 hours) at 03/24/2021 1033 ?Last data filed at 03/24/2021 0800 ?Gross per 24 hour  ?Intake 3543.93 ml  ?Output 480 ml  ?Net 3063.93 ml  ? ?Filed Weights  ? 03/22/21 2118 03/23/21 0440 03/24/21 0500  ?Weight: 62.8 kg 67.1 kg 68.4 kg  ? ? ?Telemetry  ?  ?AF RVR - Personally Reviewed ? ? ?Physical Exam  ? ?Gen: Elderly female in mild distress ?Neck: No JVD, no carotid bruit ?Cardiac: No Rubs or Gallops, IRIR tachycardia ?Respiratory: decreased breath  sounds bilaterally ?GI: Soft, nontender, non-distended  ?MS: No  edema;  moves all extremities ?Integument: Skin feels warm ?Neuro:  At time of evaluation, alert and oriented to person/place/time/situation  ?Psych: Normal affect, patient feels poorly ? ? ?Labs  ?  ?Chemistry ?Recent Labs  ?Lab 03/22/21 ?9563 03/23/21 ?0401 03/24/21 ?0406  ?NA 135 136 135  ?K 3.8 4.2 4.0  ?CL 96* 103 103  ?CO2 27 26 25   ?GLUCOSE 250* 240* 218*  ?BUN 14 14 10   ?CREATININE 0.75 0.64 0.59  ?CALCIUM 8.6* 8.0* 8.0*  ?PROT 7.6 6.0*  --   ?ALBUMIN 3.4* 2.5*  --   ?AST 15 11*  --   ?ALT 12 11  --   ?ALKPHOS 99 66  --   ?BILITOT 0.7 0.9  --   ?GFRNONAA >60 >60 >60  ?ANIONGAP 12 7 7   ?  ? ?Hematology ?Recent Labs  ?Lab 03/22/21 ?8756 03/23/21 ?0401 03/24/21 ?0406  ?WBC 14.0* 13.4* 11.7*  ?RBC 4.24 3.42* 3.34*  ?HGB 12.0 9.8* 9.3*  ?HCT 37.8 30.8* 30.7*  ?MCV 89.2 90.1 91.9  ?MCH 28.3 28.7 27.8  ?MCHC 31.7 31.8 30.3  ?RDW 13.5 13.5 13.5  ?PLT 295 253 277  ? ? ?  Cardiac EnzymesNo results for input(s): TROPONINI in the last 168 hours. No results for input(s): TROPIPOC in the last 168 hours.  ? ?BNP ?Recent Labs  ?Lab 03/22/21 ?0938  ?BNP 365.0*  ?  ? ?DDimer No results for input(s): DDIMER in the last 168 hours.  ? ?Radiology  ?  ?No results found. ? ?Patient Profile  ?   ?76 y.o. female with a hx of orthostatic hypotension on midodrine, prior stroke, DM, and PAF who presents with multi-focal PNA and AF RVR ? ?Assessment & Plan  ?  ?AF RVR ?Pneumonia ?Prior stroke and DM ?- CHADVASC 6 on eliquis  ?- continue IV amiodarone and will add digoxin (being more aggressive as she is symptomatic) ? ? ?For questions or updates, please contact [place] ?Please consult [email or web address] for contact info under Cardiology/STEMI. ?  ?   ?Signed, ?Werner Lean, MD  ?03/24/2021, 10:33 AM   ? ?

## 2021-03-24 NOTE — Progress Notes (Signed)
Pt HR 110-150 afib rvr currently on amio gtt.  Pt was rate controlled prior to shift report.  Dr. Clearence Ped notified.  One time metoprolol given and amio gtt rate increased.  Will continue to monitor. ?

## 2021-03-25 DIAGNOSIS — J9601 Acute respiratory failure with hypoxia: Secondary | ICD-10-CM | POA: Diagnosis not present

## 2021-03-25 DIAGNOSIS — I4891 Unspecified atrial fibrillation: Secondary | ICD-10-CM | POA: Diagnosis not present

## 2021-03-25 DIAGNOSIS — J181 Lobar pneumonia, unspecified organism: Secondary | ICD-10-CM | POA: Diagnosis not present

## 2021-03-25 DIAGNOSIS — I10 Essential (primary) hypertension: Secondary | ICD-10-CM

## 2021-03-25 LAB — GLUCOSE, CAPILLARY
Glucose-Capillary: 280 mg/dL — ABNORMAL HIGH (ref 70–99)
Glucose-Capillary: 218 mg/dL — ABNORMAL HIGH (ref 70–99)
Glucose-Capillary: 240 mg/dL — ABNORMAL HIGH (ref 70–99)
Glucose-Capillary: 256 mg/dL — ABNORMAL HIGH (ref 70–99)

## 2021-03-25 LAB — BASIC METABOLIC PANEL
Anion gap: 6 (ref 5–15)
BUN: 7 mg/dL — ABNORMAL LOW (ref 8–23)
CO2: 28 mmol/L (ref 22–32)
Calcium: 8.2 mg/dL — ABNORMAL LOW (ref 8.9–10.3)
Chloride: 101 mmol/L (ref 98–111)
Creatinine, Ser: 0.56 mg/dL (ref 0.44–1.00)
GFR, Estimated: >60 mL/min (ref >60)
Glucose, Bld: 244 mg/dL — ABNORMAL HIGH (ref 70–99)
Potassium: 3.9 mmol/L (ref 3.5–5.1)
Sodium: 135 mmol/L (ref 135–145)

## 2021-03-25 LAB — CBC WITH DIFFERENTIAL/PLATELET
Abs Immature Granulocytes: 0.09 10*3/uL — ABNORMAL HIGH (ref 0.00–0.07)
Basophils Absolute: 0 10*3/uL (ref 0.0–0.1)
Basophils Relative: 0 %
Eosinophils Absolute: 0.3 10*3/uL (ref 0.0–0.5)
Eosinophils Relative: 3 %
HCT: 31.7 % — ABNORMAL LOW (ref 36.0–46.0)
Hemoglobin: 9.9 g/dL — ABNORMAL LOW (ref 12.0–15.0)
Immature Granulocytes: 1 %
Lymphocytes Relative: 9 %
Lymphs Abs: 0.8 10*3/uL (ref 0.7–4.0)
MCH: 28 pg (ref 26.0–34.0)
MCHC: 31.2 g/dL (ref 30.0–36.0)
MCV: 89.8 fL (ref 80.0–100.0)
Monocytes Absolute: 0.7 10*3/uL (ref 0.1–1.0)
Monocytes Relative: 7 %
Neutro Abs: 7.9 10*3/uL — ABNORMAL HIGH (ref 1.7–7.7)
Neutrophils Relative %: 80 %
Platelets: 295 10*3/uL (ref 150–400)
RBC: 3.53 MIL/uL — ABNORMAL LOW (ref 3.87–5.11)
RDW: 13.4 % (ref 11.5–15.5)
WBC: 9.8 10*3/uL (ref 4.0–10.5)
nRBC: 0 % (ref 0.0–0.2)

## 2021-03-25 LAB — MAGNESIUM: Magnesium: 1.8 mg/dL (ref 1.7–2.4)

## 2021-03-25 MED ORDER — GUAIFENESIN ER 600 MG PO TB12
600.0000 mg | ORAL_TABLET | Freq: Two times a day (BID) | ORAL | Status: DC
  Administered 2021-03-25 – 2021-04-01 (×15): 600 mg via ORAL
  Filled 2021-03-25 (×15): qty 1

## 2021-03-25 MED ORDER — INSULIN ASPART 100 UNIT/ML IJ SOLN
6.0000 [IU] | Freq: Three times a day (TID) | INTRAMUSCULAR | Status: DC
Start: 2021-03-26 — End: 2021-03-27
  Administered 2021-03-26 – 2021-03-27 (×5): 6 [IU] via SUBCUTANEOUS

## 2021-03-25 MED ORDER — INSULIN ASPART 100 UNIT/ML IJ SOLN
5.0000 [IU] | Freq: Three times a day (TID) | INTRAMUSCULAR | Status: DC
  Administered 2021-03-25: 5 [IU] via SUBCUTANEOUS

## 2021-03-25 MED ORDER — INSULIN GLARGINE-YFGN 100 UNIT/ML ~~LOC~~ SOLN
18.0000 [IU] | Freq: Every day | SUBCUTANEOUS | Status: DC
  Administered 2021-03-25 – 2021-03-26 (×2): 18 [IU] via SUBCUTANEOUS
  Filled 2021-03-25 (×3): qty 0.18

## 2021-03-25 MED ORDER — ALPRAZOLAM 0.5 MG PO TABS
0.5000 mg | ORAL_TABLET | Freq: Once | ORAL | Status: AC
  Administered 2021-03-25: 0.5 mg via ORAL
  Filled 2021-03-25: qty 1

## 2021-03-25 MED ORDER — INSULIN GLARGINE-YFGN 100 UNIT/ML ~~LOC~~ SOLN
16.0000 [IU] | Freq: Every day | SUBCUTANEOUS | Status: DC
  Filled 2021-03-25: qty 0.16

## 2021-03-25 MED ORDER — AMIODARONE LOAD VIA INFUSION
150.0000 mg | Freq: Once | INTRAVENOUS | Status: AC
  Administered 2021-03-25: 150 mg via INTRAVENOUS
  Filled 2021-03-25: qty 83.34

## 2021-03-25 MED ORDER — DILTIAZEM HCL 30 MG PO TABS
30.0000 mg | ORAL_TABLET | Freq: Four times a day (QID) | ORAL | Status: DC
  Administered 2021-03-25 – 2021-03-31 (×25): 30 mg via ORAL
  Filled 2021-03-25 (×25): qty 1

## 2021-03-25 NOTE — Progress Notes (Signed)
PROGRESS NOTE   [name]  IHK:[id number] DOB: 06-12-1945 DOA: 03/22/2021 PCP: Carrolyn Meiers, MD   Chief Complaint  Patient presents with   Shortness of Breath   Level of care: Stepdown  Brief Admission History:  76 year old female with a history of paroxysmal atrial fibrillation on apixaban, hypertension, orthostatic hypotension, anxiety/depression, diabetes mellitus type 2, hyperlipidemia, CVA, chronic pain, schizophrenia, GERD presenting with 2-day history of coughing and shortness of breath from high [place].  History is limited secondary to patient being in extremis.  Upon EMS arrival, the patient was noted to have oxygen saturation 80% on room air.  She was noted to have atrial fibrillation with RVR.  The patient denies any chest pain but complains of shortness of breath and coughing.  There is no history of nausea, vomiting, diarrhea, domino pain. In the ED, the patient had a fever up to 101.7 F with tachycardia initially in the 170s.  She was hemodynamically stable.  She was placed on BiPAP.  She was started on vancomycin, cefepime, metronidazole.  Chest x-ray showed bilateral interstitial and airspace opacities.  Lactic acid was 2.5 >>2.1.  Sodium 135, potassium 3.8, bicarbonate 27, serum creatinine 0.75.  LFTs were unremarkable.  WBC 14.0, hemoglobin 12.0, platelets 295,000.  03/23/2021:  pt taken off bipap in AM, on nasal cannula.  Cardiology stopped IV diltiazem and lopressor and started IV amiodarone.    03/24/2021: Afib RVR persists overnight, remains on IV amiodarone.  Cardio added digoxin.  Still with dyspnea symptoms and palpitations.    03/25/2021: Pt reports that she still has shortness of breath and palpitations and her anxiety is out of control. She remains on IV amiodarone. Additional IV bolus ordered by Dr. Harl Bowie.    Assessment and Plan: * Acute respiratory failure with hypoxia (HCC) Secondary to pneumonia Currently on  BiPAP--wean as tolerated back to  RA Obtain VBG  Paroxysmal atrial fibrillation (HCC) Currently in RVR cardiology team added oral diltiazem 3/3 Remains on IV amiodarone and digoxin (for temp use only)   Sepsis due to undetermined organism ([place]) SEPSIS PHYSIOLOGY RESOLVED NOW Present on admission Due to pneumonia Presents with fever, leukocytosis and respiratory failure Lactic 2.5>>2.1 Continue IVF Completing course of IV abx Follow blood culture   Atrial fibrillation with RVR ([place]) IV amiodarone per cardiology starting 03/23/21, dig added 3/2 Remains uncontrolled.  Additional IV bolus ordered on 3/3 by Dr. Harl Bowie Digoxin per cardiology.    Uncontrolled type 2 diabetes mellitus with hyperglycemia, with long-term current use of insulin (HCC) Semglee 14 units plus novolog 5 unit TIDAC, CBG 5 times per day NovoLog sliding scale hemoglobin A1c 7.1%.    CBG (last 3)  Recent Labs    03/24/21 2126 03/25/21 0820 03/25/21 1137  GLUCAP 164* 280* 218*     Lobar pneumonia (HCC) Continue ceftriaxone/azithromycin MRSA screen negative  PCT 0.92  Schizophrenia (HCC) Continue Abilify, Risperdal, Remeron, Cymbalta  Orthostasis Continue home midodrine  Mixed hyperlipidemia Continue statin  Essential hypertension, benign Cardiology team discontinued IV diltiazem and IV metoprolol temporarily while on IV amiodarone but ultimate plan is to restart her home regimen when her heart is again controlled.   DVT prophylaxis: apixaban  Code Status: full  Family Communication:  Disposition: Status is: Inpatient Remains inpatient appropriate because: IV abx, IV amiodarone infusion  Consultants:  Cardiology  Procedures:   Antimicrobials:  Cefepime 3/1>>  Subjective: Pt reports ongoing SOB and anxiety uncontrolled. No chest pain.    Objective: Vitals:   03/25/21 1230 03/25/21 1300  03/25/21 1330 03/25/21 1417  BP: 140/71 129/76 130/60   Pulse: 74 (!) 141 (!) 125   Resp: (!) 37 (!) 28 (!) 36   Temp:       TempSrc:      SpO2: 97% 96% 97% 95%  Weight:      Height:        Intake/Output Summary (Last 24 hours) at 03/25/2021 1418 Last data filed at 03/25/2021 1140 Gross per 24 hour  Intake 1331.47 ml  Output 3200 ml  Net -1868.53 ml   Filed Weights   03/23/21 0440 03/24/21 0500 03/25/21 0600  Weight: 67.1 kg 68.4 kg 69.3 kg   Examination:  General exam: Appears calm and comfortable  Respiratory system: shallow breathing bilateral with rales heard LLL Cardiovascular system: normal S1 & S2 heard. No JVD, murmurs, rubs, gallops or clicks. No pedal edema. Gastrointestinal system: Abdomen is nondistended, soft and nontender. No organomegaly or masses felt. Normal bowel sounds heard. Central nervous system: Alert and oriented. No focal neurological deficits. Extremities: Symmetric 5 x 5 power. Skin: No rashes, lesions or ulcers. Psychiatry: Judgement and insight appear poor. Mood & affect flat.   Data Reviewed: I have personally reviewed following labs and imaging studies  CBC: Recent Labs  Lab 03/22/21 0938 03/23/21 0401 03/24/21 0406 03/25/21 0442  WBC 14.0* 13.4* 11.7* 9.8  NEUTROABS 12.9*  --  9.7* 7.9*  HGB 12.0 9.8* 9.3* 9.9*  HCT 37.8 30.8* 30.7* 31.7*  MCV 89.2 90.1 91.9 89.8  PLT 295 253 277 623    Basic Metabolic Panel: Recent Labs  Lab 03/22/21 0938 03/23/21 0401 03/24/21 0406 03/25/21 0442  NA 135 136 135 135  K 3.8 4.2 4.0 3.9  CL 96* 103 103 101  CO2 27 26 25 28   GLUCOSE 250* 240* 218* 244*  BUN 14 14 10  7*  CREATININE 0.75 0.64 0.59 0.56  CALCIUM 8.6* 8.0* 8.0* 8.2*  MG  --   --  1.8 1.8    CBG: Recent Labs  Lab 03/24/21 1142 03/24/21 1634 03/24/21 2126 03/25/21 0820 03/25/21 1137  GLUCAP 256* 173* 164* 280* 218*    Recent Results (from the past 240 hour(s))  Resp Panel by RT-PCR (Flu A&B, Covid) Nasopharyngeal Swab     Status: None   Collection Time: 03/22/21  9:07 AM   Specimen: Nasopharyngeal Swab; Nasopharyngeal(NP) swabs in vial  transport medium  Result Value Ref Range Status   SARS Coronavirus 2 by RT PCR NEGATIVE NEGATIVE Final    Comment: (NOTE) SARS-CoV-2 target nucleic acids are NOT DETECTED.  The SARS-CoV-2 RNA is generally detectable in upper respiratory specimens during the acute phase of infection. The lowest concentration of SARS-CoV-2 viral copies this assay can detect is 138 copies/mL. A negative result does not preclude SARS-Cov-2 infection and should not be used as the sole basis for treatment or other patient management decisions. A negative result may occur with  improper specimen collection/handling, submission of specimen other than nasopharyngeal swab, presence of viral mutation(s) within the areas targeted by this assay, and inadequate number of viral copies(<138 copies/mL). A negative result must be combined with clinical observations, patient history, and epidemiological information. The expected result is Negative.  Fact Sheet for Patients:  EntrepreneurPulse.com.au  Fact Sheet for Healthcare Providers:  IncredibleEmployment.be  This test is no t yet approved or cleared by the [place] FDA and  has been authorized for detection and/or diagnosis of SARS-CoV-2 by FDA under an Emergency Use Authorization (EUA). This EUA will remain  in effect (meaning this test can be used) for the duration of the COVID-19 declaration under Section 564(b)(1) of the Act, 21 U.S.C.section 360bbb-3(b)(1), unless the authorization is terminated  or revoked sooner.       Influenza A by PCR NEGATIVE NEGATIVE Final   Influenza B by PCR NEGATIVE NEGATIVE Final    Comment: (NOTE) The Xpert Xpress SARS-CoV-2/FLU/RSV plus assay is intended as an aid in the diagnosis of influenza from Nasopharyngeal swab specimens and should not be used as a sole basis for treatment. Nasal washings and aspirates are unacceptable for Xpert Xpress SARS-CoV-2/FLU/RSV testing.  Fact  Sheet for Patients: EntrepreneurPulse.com.au  Fact Sheet for Healthcare Providers: IncredibleEmployment.be  This test is not yet approved or cleared by the [place] FDA and has been authorized for detection and/or diagnosis of SARS-CoV-2 by FDA under an Emergency Use Authorization (EUA). This EUA will remain in effect (meaning this test can be used) for the duration of the COVID-19 declaration under Section 564(b)(1) of the Act, 21 U.S.C. section 360bbb-3(b)(1), unless the authorization is terminated or revoked.  Performed at [facility], [address] [place], [place] [address]   Blood Culture (routine x 2)     Status: None (Preliminary result)   Collection Time: 03/22/21  9:19 AM   Specimen: BLOOD  Result Value Ref Range Status   Specimen Description BLOOD BLOOD RIGHT HAND  Final   Special Requests   Final    BOTTLES DRAWN AEROBIC AND ANAEROBIC Blood Culture results may not be optimal due to an inadequate volume of blood received in culture bottles   Culture   Final    NO GROWTH 3 DAYS Performed at [facility], [address], [place], [place] [address]    Report Status PENDING  Incomplete  Blood Culture (routine x 2)     Status: None (Preliminary result)   Collection Time: 03/22/21  9:38 AM   Specimen: BLOOD  Result Value Ref Range Status   Specimen Description BLOOD LEFT ANTECUBITAL  Final   Special Requests   Final    Blood Culture adequate volume BOTTLES DRAWN AEROBIC AND ANAEROBIC   Culture   Final    NO GROWTH 3 DAYS Performed at [facility], [address], [name], [place] [address]    Report Status PENDING  Incomplete  MRSA Next Gen by PCR, Nasal     Status: None   Collection Time: 03/22/21  9:24 PM   Specimen: Nasal Mucosa; Nasal Swab  Result Value Ref Range Status   MRSA by PCR Next Gen NOT DETECTED NOT DETECTED Final    Comment: (NOTE) The GeneXpert MRSA Assay (FDA approved for NASAL specimens only), is one  component of a comprehensive MRSA colonization surveillance program. It is not intended to diagnose MRSA infection nor to guide or monitor treatment for MRSA infections. Test performance is not FDA approved in patients less than 39 years old. Performed at [facility], [address] [place],  [address]      Radiology Studies: No results found.  Scheduled Meds:  apixaban  5 mg Oral BID   ARIPiprazole  2 mg Oral Daily   atorvastatin  40 mg Oral Daily   Chlorhexidine Gluconate Cloth  6 each Topical Daily   digoxin  0.125 mg Oral Daily   diltiazem  30 mg Oral QID   DULoxetine  120 mg Oral Daily   gabapentin  300 mg Oral TID   guaiFENesin  600 mg Oral BID   insulin aspart  0-9 Units Subcutaneous TID WC   insulin aspart  5 Units Subcutaneous TID WC   insulin glargine-yfgn  16 Units Subcutaneous QHS   ipratropium-albuterol  3 mL Nebulization Q6H   midodrine  10 mg Oral TID   mirabegron ER  25 mg Oral Daily   mirtazapine  30 mg Oral QHS   pantoprazole  40 mg Oral BID AC   risperiDONE  1 mg Oral BID   Continuous Infusions:  0.9 % NaCl with KCl 20 mEq / L 35 mL/hr at 03/25/21 1241   amiodarone 30 mg/hr (03/25/21 0844)   azithromycin 500 mg (03/25/21 1301)   cefTRIAXone (ROCEPHIN)  IV 1 g (03/25/21 1022)     LOS: 3 days   Time spent: 40 mins  Jaydalyn Demattia Wynetta Emery, MD How to contact the [facility] Attending or Consulting provider [place] or covering provider during after hours [place], for this patient?  Check the care team in [facility] and look for a) attending/consulting [facility] provider listed and b) the [facility] team listed Log into [email or web address] and use [place]'s universal password to access. If you do not have the password, please contact the hospital operator. Locate the [facility] provider you are looking for under [facility] and page to a number that you can be directly reached. If you still have difficulty reaching the provider, please page the [facility] (Director on Call) for the  Hospitalists listed on amion for assistance.  03/25/2021, 2:18 PM

## 2021-03-25 NOTE — Progress Notes (Signed)
? ?Progress Note ? ?Patient Name: [name] ?Date of Encounter: 03/25/2021 ? ?[facility] Cardiologist: Rozann Lesches, MD  ? ?Subjective  ? ?Ongoing SOB, some palpitations.  ? ?Inpatient Medications  ?  ?Scheduled Meds: ? apixaban  5 mg Oral BID  ? ARIPiprazole  2 mg Oral Daily  ? atorvastatin  40 mg Oral Daily  ? Chlorhexidine Gluconate Cloth  6 each Topical Daily  ? digoxin  0.125 mg Oral Daily  ? DULoxetine  120 mg Oral Daily  ? gabapentin  300 mg Oral TID  ? insulin aspart  0-9 Units Subcutaneous TID WC  ? insulin aspart  3 Units Subcutaneous TID WC  ? insulin glargine-yfgn  14 Units Subcutaneous QHS  ? ipratropium-albuterol  3 mL Nebulization Q6H  ? midodrine  10 mg Oral TID  ? mirabegron ER  25 mg Oral Daily  ? mirtazapine  30 mg Oral QHS  ? pantoprazole  40 mg Oral BID AC  ? risperiDONE  1 mg Oral BID  ? ?Continuous Infusions: ? 0.9 % NaCl with KCl 20 mEq / L 35 mL/hr at 03/25/21 0300  ? amiodarone 30 mg/hr (03/25/21 0300)  ? azithromycin Stopped (03/24/21 1309)  ? cefTRIAXone (ROCEPHIN)  IV Stopped (03/24/21 1137)  ? ?PRN Meds: ?acetaminophen **OR** acetaminophen, guaiFENesin-dextromethorphan, HYDROcodone-acetaminophen, hydrOXYzine, LORazepam, ondansetron **OR** ondansetron (ZOFRAN) IV  ? ?Vital Signs  ?  ?Vitals:  ? 03/25/21 0437 03/25/21 0500 03/25/21 0600 03/25/21 0630  ?BP:  (!) 142/86  114/83  ?Pulse: 69 (!) 112  (!) 136  ?Resp: (!) 39 (!) 26  (!) 21  ?Temp:      ?TempSrc:      ?SpO2: 96% 95%  96%  ?Weight:   69.3 kg   ?Height:      ? ? ?Intake/Output Summary (Last 24 hours) at 03/25/2021 0751 ?Last data filed at 03/25/2021 0600 ?Gross per 24 hour  ?Intake 1406.47 ml  ?Output 1300 ml  ?Net 106.47 ml  ? ?Last 3 Weights 03/25/2021 03/24/2021 03/23/2021  ?Weight (lbs) 152 lb 12.5 oz 150 lb 12.7 oz 147 lb 14.9 oz  ?Weight (kg) 69.3 kg 68.4 kg 67.1 kg  ?Some encounter information is confidential and restricted. Go to Review Flowsheets activity to see all data.  ?   ? ?Telemetry  ?  ?Afib elevated rates -  Personally Reviewed ? ?ECG  ?  ?Na/ - Personally Reviewed ? ?Physical Exam  ? ?GEN: No acute distress.   ?Neck: No JVD ?Cardiac: irreg, tachy  ?Respiratory: Clear to auscultation bilaterally. ?GI: Soft, nontender, non-distended  ?MS: No edema; No deformity. ?Neuro:  Nonfocal  ?Psych: Normal affect  ? ?Labs  ?  ?High Sensitivity Troponin:   ?Recent Labs  ?Lab 03/09/21 ?1346 03/09/21 ?1527  ?TROPONINIHS 7 7  ?   ?Chemistry ?Recent Labs  ?Lab 03/22/21 ?3559 03/23/21 ?0401 03/24/21 ?0406 03/25/21 ?7416  ?NA 135 136 135 135  ?K 3.8 4.2 4.0 3.9  ?CL 96* 103 103 101  ?CO2 27 26 25 28   ?GLUCOSE 250* 240* 218* 244*  ?BUN 14 14 10  7*  ?CREATININE 0.75 0.64 0.59 0.56  ?CALCIUM 8.6* 8.0* 8.0* 8.2*  ?MG  --   --  1.8 1.8  ?PROT 7.6 6.0*  --   --   ?ALBUMIN 3.4* 2.5*  --   --   ?AST 15 11*  --   --   ?ALT 12 11  --   --   ?ALKPHOS 99 66  --   --   ?  BILITOT 0.7 0.9  --   --   ?GFRNONAA >60 >60 >60 >60  ?ANIONGAP 12 7 7 6   ?  ?Lipids No results for input(s): CHOL, TRIG, HDL, LABVLDL, LDLCALC, CHOLHDL in the last 168 hours.  ?Hematology ?Recent Labs  ?Lab 03/23/21 ?0401 03/24/21 ?0406 03/25/21 ?9935  ?WBC 13.4* 11.7* 9.8  ?RBC 3.42* 3.34* 3.53*  ?HGB 9.8* 9.3* 9.9*  ?HCT 30.8* 30.7* 31.7*  ?MCV 90.1 91.9 89.8  ?MCH 28.7 27.8 28.0  ?MCHC 31.8 30.3 31.2  ?RDW 13.5 13.5 13.4  ?PLT 253 277 295  ? ?Thyroid No results for input(s): TSH, FREET4 in the last 168 hours.  ?BNP ?Recent Labs  ?Lab 03/22/21 ?0938  ?BNP 365.0*  ?  ?DDimer No results for input(s): DDIMER in the last 168 hours.  ? ?Radiology  ?  ?No results found. ? ?Cardiac Studies  ? ? ? ?Patient Profile  ?   ?[name] is a 76 y.o. female with a hx of PAF who is being seen 03/23/2021 for the evaluation of tachycardia  at the request of Dr Tat. ?  ? ?Assessment & Plan  ? ?1.Pneumonia/Hypoxia ?- per primary team, initially on bipap now weaned to [place] ?  ?  ?2. PAF ?- presented with afib with RVR in setting of pneumonia, hypoxia ?- historically rate control has been affected by  low bp's as outpatient ?- home regimen dilt 120, toprol 75, eliquis 5mg  bid ?-low bp's on IV diltiazem, changed to IV amiodarone. Started on oral digoxin 0.125mg  daily yesterday.  ?- afib should improve as systemic illness resolves and overtime would be able to get back to her home regimen. ? ?- rates remain elevated. Rebolus amio 150mg  then resume drip at 30mg /hr. Continue oral digoxin. BP's improved, start oral diltiazem 30mg  oral qid with hold parameters. If issues with soft bp's can d/c diltiazem and could IV load digoxin 0.25mg  IV q 6 hrs x 3 doses then resume oral maintence 0.125mg  daily. AMio and dig would be short term medications.  ? ? ?3.Orthostatic hypotension ?- has been on midodrine at home, continued here.  ? ?-continue eliquis for stroke prevention ?For questions or updates, please contact [place] ?Please consult [email or web address] for contact info under  ? ?  ?   ?Signed, ?Carlyle Dolly, MD  ?03/25/2021, 7:51 AM   ? ?

## 2021-03-25 NOTE — TOC Progression Note (Signed)
Transition of Care (TOC) - Progression Note  ? ? ?Patient Details  ?Name: [name] ?MRN: [id number] ?Date of Birth: 05-Jan-1946 ? ?Transition of Care (TOC) CM/SW Contact  ?Shade Flood, LCSW ?Phone Number: ?03/25/2021, 11:38 AM ? ?Clinical Narrative:    ? ?TOC following. MD anticipating dc Monday. Requested MD order PT eval to determine if pt at ALF level for upcoming DC. Will follow. ? ?Expected Discharge Plan: Assisted Living ?Barriers to Discharge: Continued Medical Work up ? ?Expected Discharge Plan and Services ?Expected Discharge Plan: Assisted Living ?In-house Referral: Clinical Social Work ?Discharge Planning Services: CM Consult ?Post Acute Care Choice: Resumption of Svcs/PTA Provider ?Living arrangements for the past 2 months: [place] ?                ?  ?  ?  ?  ?  ?  ?  ?  ?  ?  ? ? ?Social Determinants of Health (SDOH) Interventions ?  ? ?Readmission Risk Interventions ?Readmission Risk Prevention Plan 03/23/2021  ?Transportation Screening Complete  ?[place] or Home Care Consult Complete  ?Social Work Consult for [place] Planning/Counseling Complete  ?Palliative Care Screening Not Applicable  ?Medication Review Press photographer) Complete  ?Some recent data might be hidden  ? ? ?

## 2021-03-26 ENCOUNTER — Inpatient Hospital Stay (HOSPITAL_COMMUNITY): Payer: Medicare Other

## 2021-03-26 ENCOUNTER — Inpatient Hospital Stay: Payer: Self-pay

## 2021-03-26 DIAGNOSIS — I4891 Unspecified atrial fibrillation: Secondary | ICD-10-CM | POA: Diagnosis not present

## 2021-03-26 LAB — BASIC METABOLIC PANEL
Anion gap: 6 (ref 5–15)
BUN: 8 mg/dL (ref 8–23)
CO2: 31 mmol/L (ref 22–32)
Calcium: 8.3 mg/dL — ABNORMAL LOW (ref 8.9–10.3)
Chloride: 101 mmol/L (ref 98–111)
Creatinine, Ser: 0.62 mg/dL (ref 0.44–1.00)
GFR, Estimated: >60 mL/min (ref >60)
Glucose, Bld: 192 mg/dL — ABNORMAL HIGH (ref 70–99)
Potassium: 3.8 mmol/L (ref 3.5–5.1)
Sodium: 138 mmol/L (ref 135–145)

## 2021-03-26 LAB — CBC WITH DIFFERENTIAL/PLATELET
Abs Immature Granulocytes: 0.07 10*3/uL (ref 0.00–0.07)
Basophils Absolute: 0.1 10*3/uL (ref 0.0–0.1)
Basophils Relative: 1 %
Eosinophils Absolute: 0.6 10*3/uL — ABNORMAL HIGH (ref 0.0–0.5)
Eosinophils Relative: 6 %
HCT: 30.1 % — ABNORMAL LOW (ref 36.0–46.0)
Hemoglobin: 9.6 g/dL — ABNORMAL LOW (ref 12.0–15.0)
Immature Granulocytes: 1 %
Lymphocytes Relative: 17 %
Lymphs Abs: 1.5 10*3/uL (ref 0.7–4.0)
MCH: 28.9 pg (ref 26.0–34.0)
MCHC: 31.9 g/dL (ref 30.0–36.0)
MCV: 90.7 fL (ref 80.0–100.0)
Monocytes Absolute: 0.8 10*3/uL (ref 0.1–1.0)
Monocytes Relative: 9 %
Neutro Abs: 6.3 10*3/uL (ref 1.7–7.7)
Neutrophils Relative %: 66 %
Platelets: 330 10*3/uL (ref 150–400)
RBC: 3.32 MIL/uL — ABNORMAL LOW (ref 3.87–5.11)
RDW: 13.5 % (ref 11.5–15.5)
WBC: 9.3 10*3/uL (ref 4.0–10.5)
nRBC: 0 % (ref 0.0–0.2)

## 2021-03-26 LAB — GLUCOSE, CAPILLARY
Glucose-Capillary: 262 mg/dL — ABNORMAL HIGH (ref 70–99)
Glucose-Capillary: 195 mg/dL — ABNORMAL HIGH (ref 70–99)
Glucose-Capillary: 223 mg/dL — ABNORMAL HIGH (ref 70–99)
Glucose-Capillary: 195 mg/dL — ABNORMAL HIGH (ref 70–99)
Glucose-Capillary: 171 mg/dL — ABNORMAL HIGH (ref 70–99)

## 2021-03-26 LAB — MAGNESIUM: Magnesium: 2 mg/dL (ref 1.7–2.4)

## 2021-03-26 MED ORDER — IPRATROPIUM-ALBUTEROL 0.5-2.5 (3) MG/3ML IN SOLN
3.0000 mL | Freq: Three times a day (TID) | RESPIRATORY_TRACT | Status: DC
  Administered 2021-03-26 – 2021-03-27 (×4): 3 mL via RESPIRATORY_TRACT
  Filled 2021-03-26 (×4): qty 3

## 2021-03-26 NOTE — Progress Notes (Signed)
Spoke with RN re PICC order.  Has notified [facility]. ?

## 2021-03-26 NOTE — Progress Notes (Signed)
PROGRESS NOTE   [name]  RSW:[id number] DOB: 01-04-1946 DOA: 03/22/2021 PCP: Carrolyn Meiers, MD   Chief Complaint  Patient presents with   Shortness of Breath   Level of care: Stepdown  Brief Admission History:  75 year old female with a history of paroxysmal atrial fibrillation on apixaban, hypertension, orthostatic hypotension, anxiety/depression, diabetes mellitus type 2, hyperlipidemia, CVA, chronic pain, schizophrenia, GERD presenting with 2-day history of coughing and shortness of breath from high [place].  History is limited secondary to patient being in extremis.  Upon EMS arrival, the patient was noted to have oxygen saturation 80% on room air.  She was noted to have atrial fibrillation with RVR.  The patient denies any chest pain but complains of shortness of breath and coughing.  There is no history of nausea, vomiting, diarrhea, domino pain.  In the ED, the patient had a fever up to 101.7 F with tachycardia initially in the 170s.  She was hemodynamically stable.  She was placed on BiPAP.  She was started on vancomycin, cefepime, metronidazole.  Chest x-ray showed bilateral interstitial and airspace opacities.  Lactic acid was 2.5 >>2.1.  Sodium 135, potassium 3.8, bicarbonate 27, serum creatinine 0.75.  LFTs were unremarkable.  WBC 14.0, hemoglobin 12.0, platelets 295,000.  03/23/2021:  pt taken off bipap in AM, on nasal cannula.  Cardiology stopped IV diltiazem and lopressor and started IV amiodarone.    03/24/2021: Afib RVR persists overnight, remains on IV amiodarone.  Cardio added digoxin.  Still with dyspnea symptoms and palpitations.    03/25/2021: Pt reports that she still has shortness of breath and palpitations and her anxiety is out of control. She remains on IV amiodarone. Additional IV bolus ordered by Dr. Harl Bowie.   03/26/2021: remains in afibRVR, on IV amio, oral diltiazem, oral digoxin, HR slightly better.  Complains of weakness, but no CP or SOB, c/o feeling  anxious.    Assessment and Plan: * Acute respiratory failure with hypoxia (HCC) Secondary to pneumonia and afib RVR Off BiPAP--she has been weaned back to RA  Paroxysmal atrial fibrillation (HCC) Currently in RVR cardiology team added oral diltiazem 3/3 Remains on IV amiodarone and digoxin (for temp use only)   Sepsis due to undetermined organism ([place]) SEPSIS PHYSIOLOGY RESOLVED NOW Present on admission Due to pneumonia Presents with fever, leukocytosis and respiratory failure Lactic 2.5>>2.1 Continue IVF Completing course of IV abx Follow blood culture   Atrial fibrillation with RVR ([place]) IV amiodarone per cardiology starting 03/23/21, dig added 3/2 Remains uncontrolled.  Additional IV bolus ordered on 3/3 by Dr. Harl Bowie Digoxin per cardiology.    Uncontrolled type 2 diabetes mellitus with hyperglycemia, with long-term current use of insulin (HCC) Semglee 18 units plus novolog 6 unit TIDAC, CBG 5 times per day NovoLog sliding scale hemoglobin A1c 7.1%.    CBG (last 3)  Recent Labs    03/25/21 2030 03/26/21 0403 03/26/21 0751  GLUCAP 256* 262* 195*     Lobar pneumonia (HCC) Continue ceftriaxone/azithromycin for at least 5 days MRSA screen negative   Schizophrenia (HCC) Continue Abilify, Risperdal, Remeron, Cymbalta  Orthostasis Continue home midodrine  Mixed hyperlipidemia Continue statin  Essential hypertension, benign Now back on oral cardizem per cardiology service.  Follow.    DVT prophylaxis: apixaban  Code Status: full  Family Communication:  Disposition: Status is: Inpatient Remains inpatient appropriate because: IV abx, IV amiodarone infusion  Consultants:  Cardiology  Procedures:   Antimicrobials:  Cefepime 3/1>>3/2 Ceftriaxone 3/2> Azithromycin 3/1> Subjective: Pt denies any  SOB symptoms.  Overall does not feel well.  Objective: Vitals:   03/25/21 2032 03/25/21 2200 03/25/21 2300 03/26/21 0800  BP:  113/63 111/60 (!) 155/114   Pulse:  69 (!) 113 (!) 142  Resp:    (!) 22  Temp: 98.4 F (36.9 C)     TempSrc: Oral     SpO2:  96% 93% 95%  Weight:      Height:        Intake/Output Summary (Last 24 hours) at 03/26/2021 1121 Last data filed at 03/26/2021 0800 Gross per 24 hour  Intake 1886.17 ml  Output 2750 ml  Net -863.83 ml   Filed Weights   03/23/21 0440 03/24/21 0500 03/25/21 0600  Weight: 67.1 kg 68.4 kg 69.3 kg   Examination:  General exam: Appears calm and comfortable  Respiratory system: shallow breathing bilateral no increased work of breathing.  Cardiovascular system: normal S1 & S2 heard. No JVD, murmurs, rubs, gallops or clicks. No pedal edema. Gastrointestinal system: Abdomen is nondistended, soft and nontender. No organomegaly or masses felt. Normal bowel sounds heard. Central nervous system: Alert and oriented. No focal neurological deficits. Extremities: Symmetric 5 x 5 power. Skin: No rashes, lesions or ulcers. Psychiatry: Judgement and insight appear poor. Mood & affect flat.   Data Reviewed: I have personally reviewed following labs and imaging studies  CBC: Recent Labs  Lab 03/22/21 0938 03/23/21 0401 03/24/21 0406 03/25/21 0442 03/26/21 0431  WBC 14.0* 13.4* 11.7* 9.8 9.3  NEUTROABS 12.9*  --  9.7* 7.9* 6.3  HGB 12.0 9.8* 9.3* 9.9* 9.6*  HCT 37.8 30.8* 30.7* 31.7* 30.1*  MCV 89.2 90.1 91.9 89.8 90.7  PLT 295 253 277 295 267    Basic Metabolic Panel: Recent Labs  Lab 03/22/21 0938 03/23/21 0401 03/24/21 0406 03/25/21 0442 03/26/21 0431  NA 135 136 135 135 138  K 3.8 4.2 4.0 3.9 3.8  CL 96* 103 103 101 101  CO2 '27 26 25 28 31  '$ GLUCOSE 250* 240* 218* 244* 192*  BUN '14 14 10 '$ 7* 8  CREATININE 0.75 0.64 0.59 0.56 0.62  CALCIUM 8.6* 8.0* 8.0* 8.2* 8.3*  MG  --   --  1.8 1.8 2.0    CBG: Recent Labs  Lab 03/25/21 1137 03/25/21 1649 03/25/21 2030 03/26/21 0403 03/26/21 0751  GLUCAP 218* 240* 256* 262* 195*    Recent Results (from the past 240 hour(s))   Resp Panel by RT-PCR (Flu A&B, Covid) Nasopharyngeal Swab     Status: None   Collection Time: 03/22/21  9:07 AM   Specimen: Nasopharyngeal Swab; Nasopharyngeal(NP) swabs in vial transport medium  Result Value Ref Range Status   SARS Coronavirus 2 by RT PCR NEGATIVE NEGATIVE Final    Comment: (NOTE) SARS-CoV-2 target nucleic acids are NOT DETECTED.  The SARS-CoV-2 RNA is generally detectable in upper respiratory specimens during the acute phase of infection. The lowest concentration of SARS-CoV-2 viral copies this assay can detect is 138 copies/mL. A negative result does not preclude SARS-Cov-2 infection and should not be used as the sole basis for treatment or other patient management decisions. A negative result may occur with  improper specimen collection/handling, submission of specimen other than nasopharyngeal swab, presence of viral mutation(s) within the areas targeted by this assay, and inadequate number of viral copies(<138 copies/mL). A negative result must be combined with clinical observations, patient history, and epidemiological information. The expected result is Negative.  Fact Sheet for Patients:  EntrepreneurPulse.com.au  Fact Sheet for Healthcare Providers:  IncredibleEmployment.be  This test is no t yet approved or cleared by the [place] and  has been authorized for detection and/or diagnosis of SARS-CoV-2 by FDA under an Emergency Use Authorization (EUA). This EUA will remain  in effect (meaning this test can be used) for the duration of the COVID-19 declaration under Section 564(b)(1) of the Act, 21 U.S.C.section 360bbb-3(b)(1), unless the authorization is terminated  or revoked sooner.       Influenza A by PCR NEGATIVE NEGATIVE Final   Influenza B by PCR NEGATIVE NEGATIVE Final    Comment: (NOTE) The Xpert Xpress SARS-CoV-2/FLU/RSV plus assay is intended as an aid in the diagnosis of influenza from  Nasopharyngeal swab specimens and should not be used as a sole basis for treatment. Nasal washings and aspirates are unacceptable for Xpert Xpress SARS-CoV-2/FLU/RSV testing.  Fact Sheet for Patients: EntrepreneurPulse.com.au  Fact Sheet for Healthcare Providers: IncredibleEmployment.be  This test is not yet approved or cleared by the [place] FDA and has been authorized for detection and/or diagnosis of SARS-CoV-2 by FDA under an Emergency Use Authorization (EUA). This EUA will remain in effect (meaning this test can be used) for the duration of the COVID-19 declaration under Section 564(b)(1) of the Act, 21 U.S.C. section 360bbb-3(b)(1), unless the authorization is terminated or revoked.  Performed at [facility], [address] [place], [place] [address]   Blood Culture (routine x 2)     Status: None (Preliminary result)   Collection Time: 03/22/21  9:19 AM   Specimen: BLOOD  Result Value Ref Range Status   Specimen Description BLOOD BLOOD RIGHT HAND  Final   Special Requests   Final    BOTTLES DRAWN AEROBIC AND ANAEROBIC Blood Culture results may not be optimal due to an inadequate volume of blood received in culture bottles   Culture   Final    NO GROWTH 4 DAYS Performed at [facility], [address] [place], [place] [address]    Report Status PENDING  Incomplete  Blood Culture (routine x 2)     Status: None (Preliminary result)   Collection Time: 03/22/21  9:38 AM   Specimen: BLOOD  Result Value Ref Range Status   Specimen Description BLOOD LEFT ANTECUBITAL  Final   Special Requests   Final    Blood Culture adequate volume BOTTLES DRAWN AEROBIC AND ANAEROBIC   Culture   Final    NO GROWTH 4 DAYS Performed at [facility], [address] [place], [place] [address]    Report Status PENDING  Incomplete  MRSA Next Gen by PCR, Nasal     Status: None   Collection Time: 03/22/21  9:24 PM   Specimen: Nasal Mucosa; Nasal Swab   Result Value Ref Range Status   MRSA by PCR Next Gen NOT DETECTED NOT DETECTED Final    Comment: (NOTE) The GeneXpert MRSA Assay (FDA approved for NASAL specimens only), is one component of a comprehensive MRSA colonization surveillance program. It is not intended to diagnose MRSA infection nor to guide or monitor treatment for MRSA infections. Test performance is not FDA approved in patients less than 5 years old. Performed at [facility], [address] [place] [address], [place], [place] [address]      Radiology Studies: No results found.  Scheduled Meds:  apixaban  5 mg Oral BID   ARIPiprazole  2 mg Oral Daily   atorvastatin  40 mg Oral Daily   Chlorhexidine Gluconate Cloth  6 each Topical Daily   digoxin  0.125  mg Oral Daily   diltiazem  30 mg Oral QID   DULoxetine  120 mg Oral Daily   gabapentin  300 mg Oral TID   guaiFENesin  600 mg Oral BID   insulin aspart  0-9 Units Subcutaneous TID WC   insulin aspart  6 Units Subcutaneous TID WC   insulin glargine-yfgn  18 Units Subcutaneous QHS   ipratropium-albuterol  3 mL Nebulization TID   midodrine  10 mg Oral TID   mirabegron ER  25 mg Oral Daily   mirtazapine  30 mg Oral QHS   pantoprazole  40 mg Oral BID AC   risperiDONE  1 mg Oral BID   Continuous Infusions:  0.9 % NaCl with KCl 20 mEq / L 35 mL/hr at 03/26/21 0800   amiodarone 30 mg/hr (03/26/21 0937)   azithromycin Stopped (03/25/21 1402)   cefTRIAXone (ROCEPHIN)  IV 1 g (03/26/21 1034)     LOS: 4 days   Time spent: 22 mins  Maryama Kuriakose Wynetta Emery, MD How to contact the [facility] Attending or Consulting provider [place] or covering provider during after hours [place], for this patient?  Check the care team in [facility] and look for a) attending/consulting [facility] provider listed and b) the [facility] team listed Log into [email or web address] and use [place] universal password to access. If you do not have the password, please contact the hospital operator. Locate the [facility] provider you are looking for under  [facility] and page to a number that you can be directly reached. If you still have difficulty reaching the provider, please page the [facility] (Director on Call) for the Hospitalists listed on amion for assistance.  03/26/2021, 11:21 AM

## 2021-03-27 DIAGNOSIS — I4891 Unspecified atrial fibrillation: Secondary | ICD-10-CM | POA: Diagnosis not present

## 2021-03-27 LAB — BASIC METABOLIC PANEL
Anion gap: 5 (ref 5–15)
BUN: 8 mg/dL (ref 8–23)
CO2: 32 mmol/L (ref 22–32)
Calcium: 8.1 mg/dL — ABNORMAL LOW (ref 8.9–10.3)
Chloride: 96 mmol/L — ABNORMAL LOW (ref 98–111)
Creatinine, Ser: 0.52 mg/dL (ref 0.44–1.00)
GFR, Estimated: >60 mL/min (ref >60)
Glucose, Bld: 204 mg/dL — ABNORMAL HIGH (ref 70–99)
Potassium: 3.9 mmol/L (ref 3.5–5.1)
Sodium: 133 mmol/L — ABNORMAL LOW (ref 135–145)

## 2021-03-27 LAB — GLUCOSE, CAPILLARY
Glucose-Capillary: 195 mg/dL — ABNORMAL HIGH (ref 70–99)
Glucose-Capillary: 281 mg/dL — ABNORMAL HIGH (ref 70–99)
Glucose-Capillary: 229 mg/dL — ABNORMAL HIGH (ref 70–99)
Glucose-Capillary: 94 mg/dL (ref 70–99)
Glucose-Capillary: 205 mg/dL — ABNORMAL HIGH (ref 70–99)

## 2021-03-27 LAB — CBC WITH DIFFERENTIAL/PLATELET
Abs Immature Granulocytes: 0.08 10*3/uL — ABNORMAL HIGH (ref 0.00–0.07)
Basophils Absolute: 0.1 10*3/uL (ref 0.0–0.1)
Basophils Relative: 1 %
Eosinophils Absolute: 0.5 10*3/uL (ref 0.0–0.5)
Eosinophils Relative: 4 %
HCT: 31 % — ABNORMAL LOW (ref 36.0–46.0)
Hemoglobin: 9.9 g/dL — ABNORMAL LOW (ref 12.0–15.0)
Immature Granulocytes: 1 %
Lymphocytes Relative: 11 %
Lymphs Abs: 1.4 10*3/uL (ref 0.7–4.0)
MCH: 28.9 pg (ref 26.0–34.0)
MCHC: 31.9 g/dL (ref 30.0–36.0)
MCV: 90.6 fL (ref 80.0–100.0)
Monocytes Absolute: 0.8 10*3/uL (ref 0.1–1.0)
Monocytes Relative: 6 %
Neutro Abs: 9.9 10*3/uL — ABNORMAL HIGH (ref 1.7–7.7)
Neutrophils Relative %: 77 %
Platelets: 374 10*3/uL (ref 150–400)
RBC: 3.42 MIL/uL — ABNORMAL LOW (ref 3.87–5.11)
RDW: 13.6 % (ref 11.5–15.5)
WBC: 12.6 10*3/uL — ABNORMAL HIGH (ref 4.0–10.5)
nRBC: 0 % (ref 0.0–0.2)

## 2021-03-27 LAB — BRAIN NATRIURETIC PEPTIDE: B Natriuretic Peptide: 276 pg/mL — ABNORMAL HIGH (ref 0.0–100.0)

## 2021-03-27 MED ORDER — IPRATROPIUM-ALBUTEROL 0.5-2.5 (3) MG/3ML IN SOLN
3.0000 mL | Freq: Two times a day (BID) | RESPIRATORY_TRACT | Status: DC
  Administered 2021-03-27 – 2021-04-01 (×10): 3 mL via RESPIRATORY_TRACT
  Filled 2021-03-27 (×10): qty 3

## 2021-03-27 MED ORDER — INSULIN GLARGINE-YFGN 100 UNIT/ML ~~LOC~~ SOLN
24.0000 [IU] | Freq: Every day | SUBCUTANEOUS | Status: DC
  Administered 2021-03-27 – 2021-03-31 (×5): 24 [IU] via SUBCUTANEOUS
  Filled 2021-03-27 (×6): qty 0.24

## 2021-03-27 MED ORDER — INSULIN ASPART 100 UNIT/ML IJ SOLN
8.0000 [IU] | Freq: Three times a day (TID) | INTRAMUSCULAR | Status: DC
  Administered 2021-03-28 – 2021-04-01 (×8): 8 [IU] via SUBCUTANEOUS

## 2021-03-27 NOTE — Progress Notes (Signed)
PROGRESS NOTE   [name]  BPZ:[id number] DOB: 27-Aug-1945 DOA: 03/22/2021 PCP: Carrolyn Meiers, MD   Chief Complaint  Patient presents with   Shortness of Breath   Level of care: Stepdown  Brief Admission History:  76 year old female with a history of paroxysmal atrial fibrillation on apixaban, hypertension, orthostatic hypotension, anxiety/depression, diabetes mellitus type 2, hyperlipidemia, CVA, chronic pain, schizophrenia, GERD presenting with 2-day history of coughing and shortness of breath from high [place].  History is limited secondary to patient being in extremis.  Upon EMS arrival, the patient was noted to have oxygen saturation 80% on room air.  She was noted to have atrial fibrillation with RVR.  The patient denies any chest pain but complains of shortness of breath and coughing.  There is no history of nausea, vomiting, diarrhea, domino pain.  In the ED, the patient had a fever up to 101.7 F with tachycardia initially in the 170s.  She was hemodynamically stable.  She was placed on BiPAP.  She was started on vancomycin, cefepime, metronidazole.  Chest x-ray showed bilateral interstitial and airspace opacities.  Lactic acid was 2.5 >>2.1.  Sodium 135, potassium 3.8, bicarbonate 27, serum creatinine 0.75.  LFTs were unremarkable.  WBC 14.0, hemoglobin 12.0, platelets 295,000.  03/23/2021:  pt taken off bipap in AM, on nasal cannula.  Cardiology stopped IV diltiazem and lopressor and started IV amiodarone.    03/24/2021: Afib RVR persists overnight, remains on IV amiodarone.  Cardio added digoxin.  Still with dyspnea symptoms and palpitations.    03/25/2021: Pt reports that she still has shortness of breath and palpitations and her anxiety is out of control. She remains on IV amiodarone. Additional IV bolus ordered by Dr. Harl Bowie.   03/26/2021: remains in afibRVR, on IV amio, oral diltiazem, oral digoxin, HR slightly better.  Complains of weakness, but no CP or SOB, c/o feeling  anxious.   03/27/2021: Afib still difficult to control. PICC line placed 3/4. Brief time patient was off IV amiod on 3/4 due to no IV access.  BP tolerating oral diltiazem.  Pt remains very anxious.  She reports palpitations.  She denies cough, chest pain and SOB. Plan to get up to chair today.     Assessment and Plan: * Acute respiratory failure with hypoxia (HCC) Secondary to pneumonia and afib RVR Off BiPAP--she has been weaned back to RA  Paroxysmal atrial fibrillation (HCC) Currently in RVR cardiology team added oral diltiazem 3/3 Remains on IV amiodarone and oral digoxin   Sepsis due to undetermined organism ([place]) SEPSIS PHYSIOLOGY RESOLVED NOW Present on admission Due to pneumonia Presents with fever, leukocytosis and respiratory failure Lactic 2.5>>2.1 Continue IVF Completing course of IV abx Follow blood culture   Atrial fibrillation with RVR ([place]) IV amiodarone per cardiology starting 03/23/21, dig added 3/2 Remains uncontrolled.  Additional IV bolus ordered on 3/3 by Dr. Harl Bowie Digoxin per cardiology.   ( brief period off IV amio on 3/4 due to lost IV access) PICC line now in place  Uncontrolled type 2 diabetes mellitus with hyperglycemia, with long-term current use of insulin (HCC) Increased Semglee 24 units plus novolog 8 unit TIDAC, plus SSI, CBG 5 times per day NovoLog sliding scale hemoglobin A1c 7.1%.    CBG (last 3)  Recent Labs    03/27/21 0241 03/27/21 0735 03/27/21 1133  GLUCAP 195* 281* 229*     Lobar pneumonia (HCC) Continue ceftriaxone/azithromycin for at least 5 days MRSA screen negative  Recheck portable CXR in AM.  Schizophrenia ([place]) Continue Abilify, Risperdal, Remeron, Cymbalta  Orthostasis Continue home midodrine  Mixed hyperlipidemia Continue statin  Essential hypertension, benign Now back on oral cardizem per cardiology service.  BP tolerating oral cardizem so far.    DVT prophylaxis: apixaban  Code Status: full  Family  Communication:  Disposition: Status is: Inpatient Remains inpatient appropriate because: IV abx, IV amiodarone infusion  Consultants:  Cardiology  Procedures:   Antimicrobials:  Cefepime 3/1>>3/2 Ceftriaxone 3/2> Azithromycin 3/1> Subjective: Pt denies any SOB symptoms.  Pt has been eating and drinking better.   Objective: Vitals:   03/27/21 0800 03/27/21 1000 03/27/21 1100 03/27/21 1200  BP: (!) 135/55 (!) 117/51 112/65 (!) 88/46  Pulse: (!) 135 (!) 135 (!) 114 (!) 47  Resp: (!) 29 (!) 21 (!) 21 (!) 23  Temp: 98.3 F (36.8 C)     TempSrc: Oral     SpO2: 97% 97% 98% 99%  Weight:      Height:        Intake/Output Summary (Last 24 hours) at 03/27/2021 1259 Last data filed at 03/27/2021 1258 Gross per 24 hour  Intake 1013.31 ml  Output 1800 ml  Net -786.69 ml   Filed Weights   03/23/21 0440 03/24/21 0500 03/25/21 0600  Weight: 67.1 kg 68.4 kg 69.3 kg   Examination:  General exam: Appears calm and comfortable  Respiratory system: shallow breathing bilateral no increased work of breathing.  Cardiovascular system: normal S1 & S2 heard. No JVD, murmurs, rubs, gallops or clicks. No pedal edema. Gastrointestinal system: Abdomen is nondistended, soft and nontender. No organomegaly or masses felt. Normal bowel sounds heard. Central nervous system: Alert and oriented. No focal neurological deficits. Extremities: Symmetric 5 x 5 power. Skin: No rashes, lesions or ulcers. Psychiatry: Judgement and insight appear poor. Mood & affect flat.   Data Reviewed: I have personally reviewed following labs and imaging studies  CBC: Recent Labs  Lab 03/22/21 0938 03/23/21 0401 03/24/21 0406 03/25/21 0442 03/26/21 0431 03/27/21 0356  WBC 14.0* 13.4* 11.7* 9.8 9.3 12.6*  NEUTROABS 12.9*  --  9.7* 7.9* 6.3 9.9*  HGB 12.0 9.8* 9.3* 9.9* 9.6* 9.9*  HCT 37.8 30.8* 30.7* 31.7* 30.1* 31.0*  MCV 89.2 90.1 91.9 89.8 90.7 90.6  PLT 295 253 277 295 330 353    Basic Metabolic Panel: Recent  Labs  Lab 03/23/21 0401 03/24/21 0406 03/25/21 0442 03/26/21 0431 03/27/21 0356  NA 136 135 135 138 133*  K 4.2 4.0 3.9 3.8 3.9  CL 103 103 101 101 96*  CO2 '26 25 28 31 '$ 32  GLUCOSE 240* 218* 244* 192* 204*  BUN 14 10 7* 8 8  CREATININE 0.64 0.59 0.56 0.62 0.52  CALCIUM 8.0* 8.0* 8.2* 8.3* 8.1*  MG  --  1.8 1.8 2.0  --     CBG: Recent Labs  Lab 03/26/21 1606 03/26/21 2118 03/27/21 0241 03/27/21 0735 03/27/21 1133  GLUCAP 195* 171* 195* 281* 229*    Recent Results (from the past 240 hour(s))  Resp Panel by RT-PCR (Flu A&B, Covid) Nasopharyngeal Swab     Status: None   Collection Time: 03/22/21  9:07 AM   Specimen: Nasopharyngeal Swab; Nasopharyngeal(NP) swabs in vial transport medium  Result Value Ref Range Status   SARS Coronavirus 2 by RT PCR NEGATIVE NEGATIVE Final    Comment: (NOTE) SARS-CoV-2 target nucleic acids are NOT DETECTED.  The SARS-CoV-2 RNA is generally detectable in upper respiratory specimens during the acute phase of infection. The lowest concentration of  SARS-CoV-2 viral copies this assay can detect is 138 copies/mL. A negative result does not preclude SARS-Cov-2 infection and should not be used as the sole basis for treatment or other patient management decisions. A negative result may occur with  improper specimen collection/handling, submission of specimen other than nasopharyngeal swab, presence of viral mutation(s) within the areas targeted by this assay, and inadequate number of viral copies(<138 copies/mL). A negative result must be combined with clinical observations, patient history, and epidemiological information. The expected result is Negative.  Fact Sheet for Patients:  EntrepreneurPulse.com.au  Fact Sheet for Healthcare Providers:  IncredibleEmployment.be  This test is no t yet approved or cleared by the [place] FDA and  has been authorized for detection and/or diagnosis of SARS-CoV-2  by FDA under an Emergency Use Authorization (EUA). This EUA will remain  in effect (meaning this test can be used) for the duration of the COVID-19 declaration under Section 564(b)(1) of the Act, 21 U.S.C.section 360bbb-3(b)(1), unless the authorization is terminated  or revoked sooner.       Influenza A by PCR NEGATIVE NEGATIVE Final   Influenza B by PCR NEGATIVE NEGATIVE Final    Comment: (NOTE) The Xpert Xpress SARS-CoV-2/FLU/RSV plus assay is intended as an aid in the diagnosis of influenza from Nasopharyngeal swab specimens and should not be used as a sole basis for treatment. Nasal washings and aspirates are unacceptable for Xpert Xpress SARS-CoV-2/FLU/RSV testing.  Fact Sheet for Patients: EntrepreneurPulse.com.au  Fact Sheet for Healthcare Providers: IncredibleEmployment.be  This test is not yet approved or cleared by the [place] FDA and has been authorized for detection and/or diagnosis of SARS-CoV-2 by FDA under an Emergency Use Authorization (EUA). This EUA will remain in effect (meaning this test can be used) for the duration of the COVID-19 declaration under Section 564(b)(1) of the Act, 21 U.S.C. section 360bbb-3(b)(1), unless the authorization is terminated or revoked.  Performed at [facility], [address] [place], [place] [address]   Blood Culture (routine x 2)     Status: None (Preliminary result)   Collection Time: 03/22/21  9:19 AM   Specimen: BLOOD  Result Value Ref Range Status   Specimen Description BLOOD BLOOD RIGHT HAND  Final   Special Requests   Final    BOTTLES DRAWN AEROBIC AND ANAEROBIC Blood Culture results may not be optimal due to an inadequate volume of blood received in culture bottles   Culture   Final    NO GROWTH 4 DAYS Performed at [facility], [address] [place], [place] [address]    Report Status PENDING  Incomplete  Blood Culture (routine x 2)     Status: None (Preliminary result)    Collection Time: 03/22/21  9:38 AM   Specimen: BLOOD  Result Value Ref Range Status   Specimen Description BLOOD LEFT ANTECUBITAL  Final   Special Requests   Final    Blood Culture adequate volume BOTTLES DRAWN AEROBIC AND ANAEROBIC   Culture   Final    NO GROWTH 4 DAYS Performed at [facility], [address] [place], [place] [address]    Report Status PENDING  Incomplete  MRSA Next Gen by PCR, Nasal     Status: None   Collection Time: 03/22/21  9:24 PM   Specimen: Nasal Mucosa; Nasal Swab  Result Value Ref Range Status   MRSA by PCR Next Gen NOT DETECTED NOT DETECTED Final    Comment: (NOTE) The GeneXpert MRSA Assay (FDA approved for NASAL specimens only),  is one component of a comprehensive MRSA colonization surveillance program. It is not intended to diagnose MRSA infection nor to guide or monitor treatment for MRSA infections. Test performance is not FDA approved in patients less than 1 years old. Performed at [facility], [address], [place], [place] [address]      Radiology Studies: DG CHEST PORT 1 VIEW  Result Date: 03/26/2021 CLINICAL DATA:  Shortness of breath; PICC line placement EXAM: PORTABLE CHEST 1 VIEW COMPARISON:  March 14, 2021 FINDINGS: The cardiomediastinal silhouette is unchanged and enlarged in contour.RIGHT upper extremity PICC tip terminates over the superior RIGHT atrium. Small LEFT pleural effusion. Likely trace RIGHT pleural effusion. No pneumothorax. Increased bilateral airspace opacities. Increased LEFT retrocardiac opacification. Visualized abdomen is unremarkable. Multilevel degenerative changes of the thoracic spine. IMPRESSION: 1. RIGHT upper extremity PICC tip terminates over the superior RIGHT atrium. 2. Increased patchy bilateral airspace opacities as well as increased LEFT retrocardiac consolidation. Differential considerations include pulmonary edema with scattered areas of atelectasis versus multifocal infection. Electronically Signed    By: Valentino Saxon M.D.   On: 03/26/2021 17:08   [place] EKG SITE RITE  Result Date: 03/26/2021 If Site Rite image not attached, placement could not be confirmed due to current cardiac rhythm.   Scheduled Meds:  apixaban  5 mg Oral BID   ARIPiprazole  2 mg Oral Daily   atorvastatin  40 mg Oral Daily   Chlorhexidine Gluconate Cloth  6 each Topical Daily   digoxin  0.125 mg Oral Daily   diltiazem  30 mg Oral QID   DULoxetine  120 mg Oral Daily   gabapentin  300 mg Oral TID   guaiFENesin  600 mg Oral BID   insulin aspart  0-9 Units Subcutaneous TID WC   insulin aspart  8 Units Subcutaneous TID WC   insulin glargine-yfgn  24 Units Subcutaneous QHS   ipratropium-albuterol  3 mL Nebulization BID   midodrine  10 mg Oral TID   mirabegron ER  25 mg Oral Daily   mirtazapine  30 mg Oral QHS   pantoprazole  40 mg Oral BID AC   risperiDONE  1 mg Oral BID   Continuous Infusions:  amiodarone 30 mg/hr (03/27/21 1258)   azithromycin 250 mL/hr at 03/27/21 1258   cefTRIAXone (ROCEPHIN)  IV Stopped (03/27/21 1126)     LOS: 5 days   Time spent: 85 mins  Dilraj Killgore Wynetta Emery, MD How to contact the [facility] Attending or Consulting provider [place] or covering provider during after hours [place], for this patient?  Check the care team in [facility] and look for a) attending/consulting TRH provider listed and b) the [facility] team listed Log into [email or web address] and use [place]'s universal password to access. If you do not have the password, please contact the hospital operator. Locate the [facility] provider you are looking for under [facility] and page to a number that you can be directly reached. If you still have difficulty reaching the provider, please page the [facility] (Director on Call) for the Hospitalists listed on amion for assistance.  03/27/2021, 12:59 PM

## 2021-03-27 NOTE — ED Provider Notes (Signed)
[name] Provider Note   CSN: [id number] Arrival date & time: 03/22/21  0840     History  Chief Complaint  Patient presents with   Shortness of [name] is a 76 y.o. female.  HPI     76 year old female with a history of paroxysmal atrial fibrillation on apixaban, hypertension, orthostatic hypotension, anxiety/depression, diabetes mellitus type 2, hyperlipidemia, CVA, chronic pain, schizophrenia, GERD presenting with 2-day history of coughing and shortness of breath from high [place].  History is limited secondary to patient being in extremis.  Upon EMS arrival, the patient was noted to have oxygen saturation 80% on room air.  She was noted to have atrial fibrillation with RVR.  The patient denies any chest pain but complains of shortness of breath and coughing x 2-3 days. Chart review indicates that pt has had AF with RVR in the past, and on one occasion it was difficult to manage. She is taking all her meds.  Home Medications Prior to Admission medications   Medication Sig Start Date End Date Taking? Authorizing Provider  ABILIFY 2 MG tablet Take 1 tablet (2 mg total) by mouth daily. 11/09/20 05/08/21 Yes [name], MD  acetaminophen (TYLENOL) 325 MG tablet Take 650 mg by mouth 3 (three) times daily as needed for mild pain or moderate pain.    Yes [provider]  amLODipine (NORVASC) 5 MG tablet Take 5 mg by mouth daily.   Yes [provider]  ANTI-DIARRHEAL 2 MG tablet Take 2 mg by mouth as needed for diarrhea or loose stools. 08/20/20  Yes [provider]  apixaban (ELIQUIS) 5 MG TABS tablet Take 1 tablet (5 mg total) by mouth 2 (two) times daily. 02/21/20  Yes Richarda Osmond, MD  atorvastatin (LIPITOR) 40 MG tablet Take 1 tablet by mouth daily. 05/30/20  Yes [provider]  calcium carbonate (TUMS - DOSED IN MG ELEMENTAL CALCIUM) 500 MG chewable tablet Chew 1 tablet by mouth 4 (four) times daily as  needed for heartburn.   Yes [provider]  Carboxymethylcellulose Sodium (ARTIFICIAL TEARS OP) Place 1 drop into both eyes 3 (three) times daily.   Yes [provider]  Cholecalciferol (VITAMIN D3) 50 MCG (2000 UT) TABS Take 2,000 Units by mouth daily.   Yes [provider]  clonazePAM (KLONOPIN) 0.5 MG tablet Take 1 tablet (0.5 mg total) by mouth 3 (three) times daily as needed for anxiety. Patient taking differently: Take 0.5 mg by mouth 3 (three) times daily. 04/26/20  Yes Johnson, Clanford L, MD  diltiazem (CARDIZEM CD) 120 MG 24 hr capsule Take 120 mg by mouth daily. 05/30/20  Yes [provider]  DULoxetine (CYMBALTA) 60 MG capsule Take 120 mg by mouth daily.   Yes [provider]  gabapentin (NEURONTIN) 300 MG capsule Take 1 capsule (300 mg total) by mouth 3 (three) times daily as needed. Patient taking differently: Take 300 mg by mouth 3 (three) times daily. 02/21/20  Yes Doristine Mango L, MD  glucose blood (EASYMAX TEST) test strip USE TO CHECK BLOOD SUGAR FOUR TIMES DAILY.(HOLD IF BS<70: CALL MD IF BS> 400) 03/14/21  Yes Reardon, Juanetta Beets, NP  HYDROcodone-acetaminophen (NORCO) 10-325 MG tablet Take 1 tablet by mouth 3 (three) times daily as needed for severe pain. Patient taking differently: Take 1 tablet by mouth 3 (three) times daily. 04/26/20  Yes Johnson, Clanford L, MD  hydrOXYzine (ATARAX/VISTARIL) 25 MG tablet Take 1 tablet (25 mg  total) by mouth every 8 (eight) hours as needed for anxiety or itching. Patient taking differently: Take 25 mg by mouth 3 (three) times daily. 04/26/20  Yes Johnson, Clanford L, MD  insulin detemir (LEVEMIR FLEXTOUCH) 100 UNIT/ML FlexPen Inject 55 Units into the skin at bedtime. 09/08/20  Yes Reardon, Juanetta Beets, NP  Lancets 28G MISC USE TO CHECK BLOOD SUGAR TWICE DAILY. 03/18/21  Yes Reardon, Juanetta Beets, NP  latanoprost (XALATAN) 0.005 % ophthalmic solution Place 1 drop into both eyes at bedtime.   Yes [provider]  loratadine (CLARITIN) 10 MG tablet Take 10 mg by mouth daily.   Yes [provider]  metoprolol succinate (TOPROL-XL) 25 MG 24 hr tablet Take 3 tablets (75 mg total) by mouth 2 (two) times daily. 04/26/20  Yes Johnson, Clanford L, MD  midodrine (PROAMATINE) 10 MG tablet Take 10 mg by mouth 3 (three) times daily.   Yes [provider]  mirabegron ER (MYRBETRIQ) 25 MG TB24 tablet Take 25 mg by mouth daily.   Yes [provider]  mirtazapine (REMERON) 30 MG tablet Take 30 mg by mouth at bedtime.   Yes [provider]  NOVOFINE AUTOCOVER PEN NEEDLE 30G X 8 MM [place]  11/08/20  Yes [provider]  NOVOLOG FLEXPEN 100 UNIT/ML FlexPen INJECT SUBCUTANEOUSLY AS FOLLOWS WITH MEALS: 90-150=10u: 151-200=11u: 201-250=12u: 251-300=13u: 301-350=14u: 351-400=15u: BS>400=16u & CALL MD. 02/28/21  Yes Brita Romp, NP  Nutritional Supplements (ENSURE COMPLETE PO) Take by mouth daily.   Yes [provider]  ondansetron (ZOFRAN) 4 MG tablet Take 1 tablet (4 mg total) by mouth 2 (two) times daily as needed for nausea or vomiting. Patient taking differently: Take 4 mg by mouth in the morning, at noon, in the evening, and at bedtime. 04/26/20  Yes Johnson, Clanford L, MD  pantoprazole (PROTONIX) 40 MG tablet Take 1 tablet (40 mg total) by mouth 2 (two) times daily before a meal. 11/17/20  Yes Mahala Menghini, PA-C  Plecanatide (TRULANCE) 3 MG TABS Take 3 mg by mouth daily. 11/17/20  Yes Mahala Menghini, PA-C  polyethylene glycol (MIRALAX / GLYCOLAX) 17 g packet Take 17 g by mouth daily. 02/22/20  Yes Richarda Osmond, MD  risperiDONE (RISPERDAL) 2 MG tablet Take 0.5 tablets (1 mg total) by mouth daily AND 1 tablet (2 mg total) at bedtime. 02/25/21 05/26/21 Yes [name], MD  senna (SENOKOT) 8.6 MG TABS tablet Take 1 tablet (8.6 mg total) by mouth daily. 02/22/20  Yes Richarda Osmond, MD  TRADJENTA 5 MG TABS tablet TAKE (1) TABLET BY MOUTH ONCE  DAILY. Patient taking differently: Take 5 mg by mouth daily. 09/30/20  Yes Reardon, Juanetta Beets, NP  zolpidem (AMBIEN) 5 MG tablet Take 5 mg by mouth at bedtime as needed. 05/18/20  Yes [provider]      Allergies    Sulfa antibiotics    Review of Systems   Review of Systems  All other systems reviewed and are negative.  Physical Exam Updated Vital Signs BP 125/65    Pulse 71    Temp 98.3 F (36.8 C) (Oral)    Resp (!) 23    Ht '5\' 6"'$  (1.676 m) Comment: pt stated   Wt 69.3 kg    SpO2 95%    BMI 24.66 kg/m  Physical Exam Vitals and nursing note reviewed.  Constitutional:      Appearance: She is well-developed.  HENT:     Head: Atraumatic.  Cardiovascular:  Rate and Rhythm: Tachycardia present. Rhythm irregular.  Pulmonary:     Effort: Pulmonary effort is normal.     Breath sounds: Decreased breath sounds present. No rhonchi or rales.  Musculoskeletal:     Cervical back: Normal range of motion and neck supple.  Skin:    General: Skin is warm and dry.  Neurological:     Mental Status: She is alert and oriented to person, place, and time.    ED Results / Procedures / Treatments   Labs (all labs ordered are listed, but only abnormal results are displayed) Labs Reviewed  LACTIC ACID, PLASMA - Abnormal; Notable for the following components:      Result Value   Lactic Acid, Venous 2.5 (*)    All other components within normal limits  LACTIC ACID, PLASMA - Abnormal; Notable for the following components:   Lactic Acid, Venous 2.1 (*)    All other components within normal limits  COMPREHENSIVE METABOLIC PANEL - Abnormal; Notable for the following components:   Chloride 96 (*)    Glucose, Bld 250 (*)    Calcium 8.6 (*)    Albumin 3.4 (*)    All other components within normal limits  CBC WITH DIFFERENTIAL/PLATELET - Abnormal; Notable for the following components:   WBC 14.0 (*)    Neutro Abs 12.9 (*)    All other components within normal limits  URINALYSIS,  ROUTINE W REFLEX MICROSCOPIC - Abnormal; Notable for the following components:   Ketones, ur 5 (*)    All other components within normal limits  BLOOD GAS, VENOUS - Abnormal; Notable for the following components:   pCO2, Ven 41 (*)    pO2, Ven 89 (*)    All other components within normal limits  BRAIN NATRIURETIC PEPTIDE - Abnormal; Notable for the following components:   B Natriuretic Peptide 365.0 (*)    All other components within normal limits  HEMOGLOBIN A1C - Abnormal; Notable for the following components:   Hgb A1c MFr Bld 7.1 (*)    All other components within normal limits  COMPREHENSIVE METABOLIC PANEL - Abnormal; Notable for the following components:   Glucose, Bld 240 (*)    Calcium 8.0 (*)    Total Protein 6.0 (*)    Albumin 2.5 (*)    AST 11 (*)    All other components within normal limits  CBC - Abnormal; Notable for the following components:   WBC 13.4 (*)    RBC 3.42 (*)    Hemoglobin 9.8 (*)    HCT 30.8 (*)    All other components within normal limits  GLUCOSE, CAPILLARY - Abnormal; Notable for the following components:   Glucose-Capillary 190 (*)    All other components within normal limits  GLUCOSE, CAPILLARY - Abnormal; Notable for the following components:   Glucose-Capillary 221 (*)    All other components within normal limits  GLUCOSE, CAPILLARY - Abnormal; Notable for the following components:   Glucose-Capillary 196 (*)    All other components within normal limits  GLUCOSE, CAPILLARY - Abnormal; Notable for the following components:   Glucose-Capillary 198 (*)    All other components within normal limits  BASIC METABOLIC PANEL - Abnormal; Notable for the following components:   Glucose, Bld 218 (*)    Calcium 8.0 (*)    All other components within normal limits  CBC WITH DIFFERENTIAL/PLATELET - Abnormal; Notable for the following components:   WBC 11.7 (*)    RBC 3.34 (*)    Hemoglobin  9.3 (*)    HCT 30.7 (*)    Neutro Abs 9.7 (*)    Abs  Immature Granulocytes 0.09 (*)    All other components within normal limits  GLUCOSE, CAPILLARY - Abnormal; Notable for the following components:   Glucose-Capillary 235 (*)    All other components within normal limits  GLUCOSE, CAPILLARY - Abnormal; Notable for the following components:   Glucose-Capillary 195 (*)    All other components within normal limits  GLUCOSE, CAPILLARY - Abnormal; Notable for the following components:   Glucose-Capillary 256 (*)    All other components within normal limits  BASIC METABOLIC PANEL - Abnormal; Notable for the following components:   Glucose, Bld 244 (*)    BUN 7 (*)    Calcium 8.2 (*)    All other components within normal limits  CBC WITH DIFFERENTIAL/PLATELET - Abnormal; Notable for the following components:   RBC 3.53 (*)    Hemoglobin 9.9 (*)    HCT 31.7 (*)    Neutro Abs 7.9 (*)    Abs Immature Granulocytes 0.09 (*)    All other components within normal limits  GLUCOSE, CAPILLARY - Abnormal; Notable for the following components:   Glucose-Capillary 173 (*)    All other components within normal limits  GLUCOSE, CAPILLARY - Abnormal; Notable for the following components:   Glucose-Capillary 164 (*)    All other components within normal limits  GLUCOSE, CAPILLARY - Abnormal; Notable for the following components:   Glucose-Capillary 280 (*)    All other components within normal limits  GLUCOSE, CAPILLARY - Abnormal; Notable for the following components:   Glucose-Capillary 218 (*)    All other components within normal limits  GLUCOSE, CAPILLARY - Abnormal; Notable for the following components:   Glucose-Capillary 240 (*)    All other components within normal limits  BASIC METABOLIC PANEL - Abnormal; Notable for the following components:   Glucose, Bld 192 (*)    Calcium 8.3 (*)    All other components within normal limits  CBC WITH DIFFERENTIAL/PLATELET - Abnormal; Notable for the following components:   RBC 3.32 (*)    Hemoglobin  9.6 (*)    HCT 30.1 (*)    Eosinophils Absolute 0.6 (*)    All other components within normal limits  GLUCOSE, CAPILLARY - Abnormal; Notable for the following components:   Glucose-Capillary 256 (*)    All other components within normal limits  GLUCOSE, CAPILLARY - Abnormal; Notable for the following components:   Glucose-Capillary 262 (*)    All other components within normal limits  GLUCOSE, CAPILLARY - Abnormal; Notable for the following components:   Glucose-Capillary 195 (*)    All other components within normal limits  GLUCOSE, CAPILLARY - Abnormal; Notable for the following components:   Glucose-Capillary 223 (*)    All other components within normal limits  GLUCOSE, CAPILLARY - Abnormal; Notable for the following components:   Glucose-Capillary 195 (*)    All other components within normal limits  BASIC METABOLIC PANEL - Abnormal; Notable for the following components:   Sodium 133 (*)    Chloride 96 (*)    Glucose, Bld 204 (*)    Calcium 8.1 (*)    All other components within normal limits  BRAIN NATRIURETIC PEPTIDE - Abnormal; Notable for the following components:   B Natriuretic Peptide 276.0 (*)    All other components within normal limits  CBC WITH DIFFERENTIAL/PLATELET - Abnormal; Notable for the following components:   WBC 12.6 (*)  RBC 3.42 (*)    Hemoglobin 9.9 (*)    HCT 31.0 (*)    Neutro Abs 9.9 (*)    Abs Immature Granulocytes 0.08 (*)    All other components within normal limits  GLUCOSE, CAPILLARY - Abnormal; Notable for the following components:   Glucose-Capillary 171 (*)    All other components within normal limits  GLUCOSE, CAPILLARY - Abnormal; Notable for the following components:   Glucose-Capillary 195 (*)    All other components within normal limits  GLUCOSE, CAPILLARY - Abnormal; Notable for the following components:   Glucose-Capillary 281 (*)    All other components within normal limits  GLUCOSE, CAPILLARY - Abnormal; Notable for the  following components:   Glucose-Capillary 229 (*)    All other components within normal limits  RESP PANEL BY RT-PCR (FLU A&B, COVID) [id number]  CULTURE, BLOOD (ROUTINE X 2)  CULTURE, BLOOD (ROUTINE X 2)  MRSA NEXT GEN BY PCR, NASAL  PROTIME-INR  APTT  PROCALCITONIN  MAGNESIUM  MAGNESIUM  MAGNESIUM    EKG EKG Interpretation  Date/Time:  Tuesday March 22 2021 12:24:38 EST Ventricular Rate:  104 PR Interval:    QRS Duration: 99 QT Interval:  356 QTC Calculation: 469 R Axis:   99 Text Interpretation: Atrial fibrillation Right axis deviation Low voltage, precordial leads Confirmed by Ronnald Nian, Adam ([id number]) on 03/23/2021 8:51:00 AM  Radiology DG CHEST PORT 1 VIEW  Result Date: 03/26/2021 CLINICAL DATA:  Shortness of breath; PICC line placement EXAM: PORTABLE CHEST 1 VIEW COMPARISON:  March 14, 2021 FINDINGS: The cardiomediastinal silhouette is unchanged and enlarged in contour.RIGHT upper extremity PICC tip terminates over the superior RIGHT atrium. Small LEFT pleural effusion. Likely trace RIGHT pleural effusion. No pneumothorax. Increased bilateral airspace opacities. Increased LEFT retrocardiac opacification. Visualized abdomen is unremarkable. Multilevel degenerative changes of the thoracic spine. IMPRESSION: 1. RIGHT upper extremity PICC tip terminates over the superior RIGHT atrium. 2. Increased patchy bilateral airspace opacities as well as increased LEFT retrocardiac consolidation. Differential considerations include pulmonary edema with scattered areas of atelectasis versus multifocal infection. Electronically Signed   By: Valentino Saxon M.D.   On: 03/26/2021 17:08   [place] EKG SITE RITE  Result Date: 03/26/2021 If Site Rite image not attached, placement could not be confirmed due to current cardiac rhythm.   Procedures .Critical Care Performed by: Varney Biles, MD Authorized by: Varney Biles, MD   Critical care provider statement:    Critical care time (minutes):   53   Critical care was necessary to treat or prevent imminent or life-threatening deterioration of the following conditions:  Respiratory failure   Critical care was time spent personally by me on the following activities:  Development of treatment plan with patient or surrogate, discussions with consultants, evaluation of patient's response to treatment, examination of patient, ordering and review of laboratory studies, ordering and review of radiographic studies, ordering and performing treatments and interventions, pulse oximetry, re-evaluation of patient's condition and review of old charts    Medications Ordered in ED Medications  metoprolol tartrate (LOPRESSOR) 5 MG/5ML injection (  Not Given 03/22/21 1205)  HYDROcodone-acetaminophen (NORCO) 10-325 MG per tablet 1 tablet (1 tablet Oral Given 03/27/21 1318)  atorvastatin (LIPITOR) tablet 40 mg (40 mg Oral Given 03/27/21 0907)  ARIPiprazole (ABILIFY) tablet 2 mg (2 mg Oral Given 03/27/21 0906)  midodrine (PROAMATINE) tablet 10 mg (10 mg Oral Given 03/27/21 0906)  DULoxetine (CYMBALTA) DR capsule 120 mg (120 mg Oral Given 03/27/21 0906)  hydrOXYzine (ATARAX) tablet 25  mg (25 mg Oral Given 03/27/21 0757)  risperiDONE (RISPERDAL) tablet 1 mg (1 mg Oral Given 03/27/21 0907)  pantoprazole (PROTONIX) EC tablet 40 mg (40 mg Oral Given 03/27/21 0757)  mirabegron ER (MYRBETRIQ) tablet 25 mg (25 mg Oral Given 03/27/21 0906)  apixaban (ELIQUIS) tablet 5 mg (5 mg Oral Given 03/27/21 0907)  gabapentin (NEURONTIN) capsule 300 mg (300 mg Oral Given 03/27/21 0906)  mirtazapine (REMERON) tablet 30 mg (30 mg Oral Given 03/26/21 2119)  azithromycin (ZITHROMAX) 500 mg in sodium chloride 0.9 % 250 mL IVPB (0 mg Intravenous Stopped 03/27/21 1302)  ondansetron (ZOFRAN) tablet 4 mg ( Oral See Alternative 03/26/21 1356)    Or  ondansetron (ZOFRAN) injection 4 mg (4 mg Intravenous Given 03/26/21 1356)  acetaminophen (TYLENOL) tablet 650 mg (650 mg Oral Given 03/27/21 0801)    Or  acetaminophen  (TYLENOL) suppository 650 mg ( Rectal See Alternative 03/27/21 0801)  LORazepam (ATIVAN) injection 1 mg (1 mg Intravenous Given 03/27/21 1318)  Chlorhexidine Gluconate Cloth 2 % PADS 6 each (6 each Topical Given 03/27/21 0907)  insulin aspart (novoLOG) injection 0-9 Units (3 Units Subcutaneous Given 03/27/21 1139)  guaiFENesin-dextromethorphan (ROBITUSSIN DM) 100-10 MG/5ML syrup 5 mL (5 mLs Oral Given 03/27/21 1050)  amiodarone (NEXTERONE PREMIX) 360-4.14 MG/200ML-% (1.8 mg/mL) IV infusion (60 mg/hr Intravenous Restarted 03/24/21 1104)  cefTRIAXone (ROCEPHIN) 1 g in sodium chloride 0.9 % 100 mL IVPB (0 g Intravenous Stopped 03/27/21 1126)  digoxin (LANOXIN) tablet 0.125 mg (0.125 mg Oral Given 03/27/21 0907)  amiodarone (NEXTERONE PREMIX) 360-4.14 MG/200ML-% (1.8 mg/mL) IV infusion (30 mg/hr Intravenous Infusion Verify 03/25/21 0056)  amiodarone (NEXTERONE PREMIX) 360-4.14 MG/200ML-% (1.8 mg/mL) IV infusion (30 mg/hr Intravenous Infusion Verify 03/27/21 1400)  diltiazem (CARDIZEM) tablet 30 mg (30 mg Oral Given 03/27/21 1349)  guaiFENesin (MUCINEX) 12 hr tablet 600 mg (600 mg Oral Given 03/27/21 0906)  ipratropium-albuterol (DUONEB) 0.5-2.5 (3) MG/3ML nebulizer solution 3 mL (has no administration in time range)  insulin glargine-yfgn (SEMGLEE) injection 24 Units (has no administration in time range)  insulin aspart (novoLOG) injection 8 Units (has no administration in time range)  ceFEPIme (MAXIPIME) 2 g in sodium chloride 0.9 % 100 mL IVPB (0 g Intravenous Stopped 03/22/21 1012)  diltiazem (CARDIZEM) 1 mg/mL load via infusion 15 mg (15 mg Intravenous Bolus from Bag 03/22/21 0927)  vancomycin (VANCOREADY) IVPB 1500 mg/300 mL (0 mg Intravenous Stopped 03/22/21 1222)  acetaminophen (TYLENOL) tablet 650 mg (650 mg Oral Given 03/22/21 0959)  ALPRAZolam (XANAX) tablet 0.5 mg (0.5 mg Oral Given 03/22/21 1014)  metoprolol tartrate (LOPRESSOR) injection 5 mg (5 mg Intravenous Given 03/22/21 1201)  amiodarone (NEXTERONE) 1.8 mg/mL  load via infusion 150 mg (150 mg Intravenous Bolus from Bag 03/23/21 0903)  metoprolol tartrate (LOPRESSOR) injection 2.5 mg (2.5 mg Intravenous Given 03/23/21 2354)  metoprolol tartrate (LOPRESSOR) injection 2.5 mg (2.5 mg Intravenous Given 03/24/21 0619)  amiodarone (NEXTERONE) 1.8 mg/mL load via infusion 150 mg (150 mg Intravenous Bolus from Bag 03/25/21 0806)  ALPRAZolam Duanne Moron) tablet 0.5 mg (0.5 mg Oral Given 03/25/21 1054)    ED Course/ Medical Decision Making/ A&P                           Medical Decision Making Amount and/or Complexity of Data Reviewed Labs: ordered. Radiology: ordered. ECG/medicine tests: ordered.  Risk OTC drugs. Prescription drug management. Decision regarding hospitalization.   Pt comes in w/ cc of shortness of breath and cough. Noted to be  in AF with RVR. No clear volume overload.  DDX: Pneumonia, AF with rvr - sympomatic, CHF, Pleural effusion, Pulm edema, ACS, Anemia, Renal failure, e'lyte abn.  CXR visualized - possible R sided infiltrate. Code Sepsis activated - broad spectrum antibiotics ordered.  Dilt gtt ordered for AF with RVR.  Bipap ordered for hypoxia. Level of anxiety noted - so anti-anxiety meds ordered. Stable for admission. Cards consulted for AF - but no response yet at time of admission.  Final Clinical Impression(s) / ED Diagnoses Final diagnoses:  PICC (peripherally inserted central catheter) in place  Pulmonary edema  Multifocal pneumonia    Rx / DC Orders ED Discharge Orders     None         Varney Biles, MD 03/27/21 1454

## 2021-03-28 ENCOUNTER — Inpatient Hospital Stay (HOSPITAL_COMMUNITY): Payer: Medicare Other

## 2021-03-28 DIAGNOSIS — J181 Lobar pneumonia, unspecified organism: Secondary | ICD-10-CM | POA: Diagnosis not present

## 2021-03-28 DIAGNOSIS — I4891 Unspecified atrial fibrillation: Secondary | ICD-10-CM | POA: Diagnosis not present

## 2021-03-28 DIAGNOSIS — J9601 Acute respiratory failure with hypoxia: Secondary | ICD-10-CM | POA: Diagnosis not present

## 2021-03-28 DIAGNOSIS — I10 Essential (primary) hypertension: Secondary | ICD-10-CM | POA: Diagnosis not present

## 2021-03-28 LAB — GLUCOSE, CAPILLARY
Glucose-Capillary: 185 mg/dL — ABNORMAL HIGH (ref 70–99)
Glucose-Capillary: 196 mg/dL — ABNORMAL HIGH (ref 70–99)
Glucose-Capillary: 277 mg/dL — ABNORMAL HIGH (ref 70–99)
Glucose-Capillary: 106 mg/dL — ABNORMAL HIGH (ref 70–99)
Glucose-Capillary: 172 mg/dL — ABNORMAL HIGH (ref 70–99)

## 2021-03-28 LAB — BASIC METABOLIC PANEL
Anion gap: 8 (ref 5–15)
BUN: 9 mg/dL (ref 8–23)
CO2: 33 mmol/L — ABNORMAL HIGH (ref 22–32)
Calcium: 8.1 mg/dL — ABNORMAL LOW (ref 8.9–10.3)
Chloride: 94 mmol/L — ABNORMAL LOW (ref 98–111)
Creatinine, Ser: 0.58 mg/dL (ref 0.44–1.00)
GFR, Estimated: >60 mL/min (ref >60)
Glucose, Bld: 235 mg/dL — ABNORMAL HIGH (ref 70–99)
Potassium: 3.9 mmol/L (ref 3.5–5.1)
Sodium: 135 mmol/L (ref 135–145)

## 2021-03-28 LAB — CULTURE, BLOOD (ROUTINE X 2)
Culture: NO GROWTH
Culture: NO GROWTH
Special Requests: ADEQUATE

## 2021-03-28 MED ORDER — SODIUM CHLORIDE 0.9 % IV SOLN
1.0000 g | INTRAVENOUS | Status: AC
  Administered 2021-03-28: 1 g via INTRAVENOUS
  Filled 2021-03-28: qty 10

## 2021-03-28 NOTE — Evaluation (Signed)
Physical Therapy Evaluation ?Patient Details ?Name: [name] ?MRN: [id number] ?DOB: 1945-11-05 ?Today's Date: 03/28/2021 ? ?History of Present Illness ? [name] is a  76 year old female with a history of paroxysmal atrial fibrillation on apixaban, hypertension, orthostatic hypotension, anxiety/depression, diabetes mellitus type 2, hyperlipidemia, CVA, chronic pain, schizophrenia, GERD presenting with 2-day history of coughing and shortness of breath from high [place].  History is limited secondary to patient being in extremis.  Upon EMS arrival, the patient was noted to have oxygen saturation 80% on room air.  She was noted to have atrial fibrillation with RVR.  The patient denies any chest pain but complains of shortness of breath and coughing.  There is no history of nausea, vomiting, diarrhea, domino pain. ?  ?Clinical Impression ? Patient has difficulty propping up on elbows during supine to sitting requiring Mod assist to pull self to EOB, very slow labored movement with scissoring of legs when transferring to chair and limited to a few side steps before having to sit due to weakness.  Patient tolerated sitting up in chair after therapy - RN notified.  Patient will benefit from continued skilled physical therapy in hospital and recommended venue below to increase strength, balance, endurance for safe ADLs and gait.  ?   ?   ? ?Recommendations for follow up therapy are one component of a multi-disciplinary discharge planning process, led by the attending physician.  Recommendations may be updated based on patient status, additional functional criteria and insurance authorization. ? ?Follow Up Recommendations Home health PT ? ?  ?Assistance Recommended at Discharge Intermittent Supervision/Assistance  ?Patient can return home with the following ? A lot of help with walking and/or transfers;A little help with bathing/dressing/bathroom;Help with stairs or ramp for entrance;Assistance with  cooking/housework ? ?  ?Equipment Recommendations None recommended by PT  ?Recommendations for Other Services ?    ?  ?Functional Status Assessment Patient has had a recent decline in their functional status and demonstrates the ability to make significant improvements in function in a reasonable and predictable amount of time.  ? ?  ?Precautions / Restrictions Precautions ?Precautions: Fall ?Restrictions ?Weight Bearing Restrictions: No  ? ?  ? ?Mobility ? Bed Mobility ?Overal bed mobility: Needs Assistance ?Bed Mobility: Supine to Sit ?  ?  ?Supine to sit: Min assist, Mod assist ?  ?  ?General bed mobility comments: increased time, labored movement ?  ? ?Transfers ?Overall transfer level: Needs assistance ?Equipment used: Rolling walker (2 wheels) ?Transfers: Sit to/from Stand, Bed to chair/wheelchair/BSC ?Sit to Stand: Min assist, Mod assist ?  ?Step pivot transfers: Mod assist ?  ?  ?  ?General transfer comment: unsteady labored movement ?  ? ?Ambulation/Gait ?Ambulation/Gait assistance: Mod assist ?Gait Distance (Feet): 4 Feet ?Assistive device: Rolling walker (2 wheels) ?Gait Pattern/deviations: Decreased step length - right, Decreased step length - left, Decreased stride length ?Gait velocity: slow ?  ?  ?General Gait Details: limited to a few slow labored side steps before having to sit due to fatigue and BLE weakness ? ?Stairs ?  ?  ?  ?  ?  ? ?Wheelchair Mobility ?  ? ?Modified Rankin (Stroke Patients Only) ?  ? ?  ? ?Balance Overall balance assessment: Needs assistance ?Sitting-balance support: Feet supported, No upper extremity supported ?Sitting balance-Leahy Scale: Fair ?Sitting balance - Comments: fair/good seated at EOB ?  ?Standing balance support: Bilateral upper extremity supported ?Standing balance-Leahy Scale: Poor ?Standing balance comment: fair/poor using RW ?  ?  ?  ?  ?  ?  ?  ?  ?  ?  ?  ?   ? ? ? ?  Pertinent Vitals/Pain Pain Assessment ?Pain Assessment: Faces ?Faces Pain Scale: Hurts a  little bit ?Pain Location: BLE with pressure/movement ?Pain Descriptors / Indicators: Sore ?Pain Intervention(s): Limited activity within patient's tolerance, Monitored during session, Repositioned  ? ? ?Home Living Family/patient expects to be discharged to:: Assisted living ?  ?  ?  ?  ?  ?  ?  ?  ?Home Equipment: Conservation officer, nature (2 wheels);Wheelchair - manual;Hospital bed ?   ?  ?Prior Function Prior Level of Function : Needs assist ?  ?  ?  ?Physical Assist : Mobility (physical) ?Mobility (physical): Bed mobility;Transfers;Gait;Stairs ?  ?Mobility Comments: Assisted for transfers and a few steps using RW during transfers ?ADLs Comments: asissted by ALF staff ?  ? ? ?Hand Dominance  ? Dominant Hand: Right ? ?  ?Extremity/Trunk Assessment  ? Upper Extremity Assessment ?Upper Extremity Assessment: Generalized weakness ?  ? ?Lower Extremity Assessment ?Lower Extremity Assessment: Generalized weakness ?  ? ?Cervical / Trunk Assessment ?Cervical / Trunk Assessment: Kyphotic  ?Communication  ? Communication: No difficulties  ?Cognition Arousal/Alertness: Awake/alert ?Behavior During Therapy: [facility] for tasks assessed/performed ?Overall Cognitive Status: Within Functional Limits for tasks assessed ?  ?  ?  ?  ?  ?  ?  ?  ?  ?  ?  ?  ?  ?  ?  ?  ?  ?  ?  ? ?  ?General Comments   ? ?  ?Exercises    ? ?Assessment/Plan  ?  ?PT Assessment Patient needs continued PT services  ?PT Problem List Decreased strength;Decreased activity tolerance;Decreased balance;Decreased mobility ? ?   ?  ?PT Treatment Interventions DME instruction;Gait training;Stair training;Functional mobility training;Therapeutic activities;Therapeutic exercise;Patient/family education;Balance training   ? ?PT Goals (Current goals can be found in the Care Plan section)  ?Acute Rehab PT Goals ?Patient Stated Goal: return to ALF with staff to assist ?PT Goal Formulation: With patient ?Time For Goal Achievement: 04/04/21 ?Potential to Achieve Goals: Good ? ?   ?Frequency Min 3X/week ?  ? ? ?Co-evaluation   ?  ?  ?  ?  ? ? ?  ?AM-PAC PT "6 Clicks" Mobility  ?Outcome Measure Help needed turning from your back to your side while in a flat bed without using bedrails?: A Little ?Help needed moving from lying on your back to sitting on the side of a flat bed without using bedrails?: A Lot ?Help needed moving to and from a bed to a chair (including a wheelchair)?: A Lot ?Help needed standing up from a chair using your arms (e.g., wheelchair or bedside chair)?: A Lot ?Help needed to walk in hospital room?: A Lot ?Help needed climbing 3-5 steps with a railing? : Total ?6 Click Score: 12 ? ?  ?End of Session   ?Activity Tolerance: Patient tolerated treatment well;Patient limited by fatigue ?Patient left: in chair;with call bell/phone within reach ?Nurse Communication: Mobility status ?PT Visit Diagnosis: Unsteadiness on feet (R26.81);Other abnormalities of gait and mobility (R26.89);Muscle weakness (generalized) (M62.81) ?  ? ?Time: 1020-1040 ?PT Time Calculation (min) (ACUTE ONLY): 20 min ? ? ?Charges:   PT Evaluation ?$PT Eval Moderate Complexity: 1 Mod ?PT Treatments ?$Therapeutic Activity: 8-22 mins ?  ?   ? ? ?2:16 PM, 03/28/21 ?Lonell Grandchild, MPT ?Physical Therapist with [place] ?[facility] ?[phone] office ?[phone] mobile phone ? ? ?

## 2021-03-28 NOTE — Progress Notes (Signed)
PROGRESS NOTE   [name]  DVV:[id number] DOB: October 16, 1945 DOA: 03/22/2021 PCP: Carrolyn Meiers, MD   Chief Complaint  Patient presents with   Shortness of Breath   Level of care: Stepdown  Brief Admission History:  76 year old female with a history of paroxysmal atrial fibrillation on apixaban, hypertension, orthostatic hypotension, anxiety/depression, diabetes mellitus type 2, hyperlipidemia, CVA, chronic pain, schizophrenia, GERD presenting with 2-day history of coughing and shortness of breath from high [place].  History is limited secondary to patient being in extremis.  Upon EMS arrival, the patient was noted to have oxygen saturation 80% on room air.  She was noted to have atrial fibrillation with RVR.  The patient denies any chest pain but complains of shortness of breath and coughing.  There is no history of nausea, vomiting, diarrhea, domino pain.  In the ED, the patient had a fever up to 101.7 F with tachycardia initially in the 170s.  She was hemodynamically stable.  She was placed on BiPAP.  She was started on vancomycin, cefepime, metronidazole.  Chest x-ray showed bilateral interstitial and airspace opacities.  Lactic acid was 2.5 >>2.1.  Sodium 135, potassium 3.8, bicarbonate 27, serum creatinine 0.75.  LFTs were unremarkable.  WBC 14.0, hemoglobin 12.0, platelets 295,000.  03/23/2021:  pt taken off bipap in AM, on nasal cannula.  Cardiology stopped IV diltiazem and lopressor and started IV amiodarone.    03/24/2021: Afib RVR persists overnight, remains on IV amiodarone.  Cardio added digoxin.  Still with dyspnea symptoms and palpitations.    03/25/2021: Pt reports that she still has shortness of breath and palpitations and her anxiety is out of control. She remains on IV amiodarone. Additional IV bolus ordered by Dr. Harl Bowie.   03/26/2021: remains in afibRVR, on IV amio, oral diltiazem, oral digoxin, HR slightly better.  Complains of weakness, but no CP or SOB, c/o feeling  anxious.   03/27/2021: Afib still difficult to control. PICC line placed 3/4. Brief time patient was off IV amiod on 3/4 due to no IV access.  BP tolerating oral diltiazem.  Pt remains very anxious.  She reports palpitations.  She denies cough, chest pain and SOB. Plan to get up to chair today.    03/28/2021: Breathing much improved.  Back to baseline oxygen requirements.  Afib RVR persists on IV amiodarone, cardizem, digoxin.  Planning PT eval when heart rate better controlled for better idea of disposition.     Assessment and Plan: * Acute respiratory failure with hypoxia (HCC) Secondary to pneumonia and afib RVR Off BiPAP--she has been weaned back to RA  Paroxysmal atrial fibrillation (HCC) Currently in RVR cardiology team added oral diltiazem 3/3 Remains on IV amiodarone and oral digoxin and oral cardizem   Sepsis due to undetermined organism ([employer]) SEPSIS PHYSIOLOGY RESOLVED NOW Present on admission Due to pneumonia Presented with fever, leukocytosis and respiratory failure - all treated and resolved Lactic 2.5>>2.1 Completed IVF Completed course of IV abx 03/28/21 Blood culture no growth to date.    Atrial fibrillation with RVR (HCC) IV amiodarone per cardiology starting 03/23/21, dig added 3/2 Remains uncontrolled.  Additional IV bolus ordered on 3/3 by Dr. Harl Bowie Digoxin per cardiology.   ( brief period off IV amio on 3/4 due to lost IV access) PICC line now in place  Uncontrolled type 2 diabetes mellitus with hyperglycemia, with long-term current use of insulin (HCC) Continue Semglee 24 units plus novolog 8 unit TIDAC, plus SSI, CBG 5 times per day NovoLog sliding scale  hemoglobin A1c 7.1%.    CBG (last 3)  Recent Labs    03/28/21 0400 03/28/21 0741 03/28/21 1236  GLUCAP 185* 196* 277*     Lobar pneumonia (HCC) Treated with ceftriaxone/azithromycin for full 7 days MRSA screen negative  Not surprised that there is clinical improvement before chest xray improves   Recommend patient get PA/Lat CXR in 4-6 weeks to document resolution.   Schizophrenia ([place]) Continue Abilify, Risperdal, Remeron, Cymbalta  Orthostasis Continue home midodrine  Mixed hyperlipidemia Continue statin  Essential hypertension, benign Now back on oral cardizem per cardiology service.  BP tolerating oral cardizem so far.    DVT prophylaxis: apixaban  Code Status: full  Family Communication:  Disposition: Status is: Inpatient Remains inpatient appropriate because: IV abx, IV amiodarone infusion  Consultants:  Cardiology  Procedures:   Antimicrobials:  Cefepime 3/1>>3/2 Ceftriaxone 3/2>3/6 Azithromycin 3/1>3/6  Subjective: Pt denies any SOB symptoms.  Pt having intermittent palpitations.  No chest pain.     Objective: Vitals:   03/28/21 1214 03/28/21 1255 03/28/21 1257 03/28/21 1300  BP:   128/71   Pulse: (!) 133  (!) 124 (!) 129  Resp: (!) 22  (!) 22 19  Temp: 98.4 F (36.9 C)     TempSrc: Oral     SpO2: 98% 99% 99% 99%  Weight:      Height:        Intake/Output Summary (Last 24 hours) at 03/28/2021 1355 Last data filed at 03/28/2021 1245 Gross per 24 hour  Intake 880.8 ml  Output 800 ml  Net 80.8 ml   Filed Weights   03/24/21 0500 03/25/21 0600 03/28/21 0404  Weight: 68.4 kg 69.3 kg 75.8 kg   Examination:  General exam: Appears calm and comfortable  Respiratory system: shallow breathing bilateral no increased work of breathing.  Cardiovascular system: normal S1 & S2 heard. No JVD, murmurs, rubs, gallops or clicks. No pedal edema. Gastrointestinal system: Abdomen is nondistended, soft and nontender. No organomegaly or masses felt. Normal bowel sounds heard. Central nervous system: Alert and oriented. No focal neurological deficits. Extremities: Symmetric 5 x 5 power. Skin: No rashes, lesions or ulcers. Psychiatry: Judgement and insight appear poor. Mood & affect flat.   Data Reviewed: I have personally reviewed following labs and imaging  studies  CBC: Recent Labs  Lab 03/22/21 0938 03/23/21 0401 03/24/21 0406 03/25/21 0442 03/26/21 0431 03/27/21 0356  WBC 14.0* 13.4* 11.7* 9.8 9.3 12.6*  NEUTROABS 12.9*  --  9.7* 7.9* 6.3 9.9*  HGB 12.0 9.8* 9.3* 9.9* 9.6* 9.9*  HCT 37.8 30.8* 30.7* 31.7* 30.1* 31.0*  MCV 89.2 90.1 91.9 89.8 90.7 90.6  PLT 295 253 277 295 330 315    Basic Metabolic Panel: Recent Labs  Lab 03/24/21 0406 03/25/21 0442 03/26/21 0431 03/27/21 0356 03/28/21 0737  NA 135 135 138 133* 135  K 4.0 3.9 3.8 3.9 3.9  CL 103 101 101 96* 94*  CO2 '25 28 31 '$ 32 33*  GLUCOSE 218* 244* 192* 204* 235*  BUN 10 7* '8 8 9  '$ CREATININE 0.59 0.56 0.62 0.52 0.58  CALCIUM 8.0* 8.2* 8.3* 8.1* 8.1*  MG 1.8 1.8 2.0  --   --     CBG: Recent Labs  Lab 03/27/21 1643 03/27/21 2137 03/28/21 0400 03/28/21 0741 03/28/21 1236  GLUCAP 94 205* 185* 196* 277*    Recent Results (from the past 240 hour(s))  Resp Panel by RT-PCR (Flu A&B, Covid) Nasopharyngeal Swab     Status: None  Collection Time: 03/22/21  9:07 AM   Specimen: Nasopharyngeal Swab; Nasopharyngeal(NP) swabs in vial transport medium  Result Value Ref Range Status   SARS Coronavirus 2 by RT PCR NEGATIVE NEGATIVE Final    Comment: (NOTE) SARS-CoV-2 target nucleic acids are NOT DETECTED.  The SARS-CoV-2 RNA is generally detectable in upper respiratory specimens during the acute phase of infection. The lowest concentration of SARS-CoV-2 viral copies this assay can detect is 138 copies/mL. A negative result does not preclude SARS-Cov-2 infection and should not be used as the sole basis for treatment or other patient management decisions. A negative result may occur with  improper specimen collection/handling, submission of specimen other than nasopharyngeal swab, presence of viral mutation(s) within the areas targeted by this assay, and inadequate number of viral copies(<138 copies/mL). A negative result must be combined with clinical observations,  patient history, and epidemiological information. The expected result is Negative.  Fact Sheet for Patients:  EntrepreneurPulse.com.au  Fact Sheet for Healthcare Providers:  IncredibleEmployment.be  This test is no t yet approved or cleared by the [place] FDA and  has been authorized for detection and/or diagnosis of SARS-CoV-2 by FDA under an Emergency Use Authorization (EUA). This EUA will remain  in effect (meaning this test can be used) for the duration of the COVID-19 declaration under Section 564(b)(1) of the Act, 21 U.S.C.section 360bbb-3(b)(1), unless the authorization is terminated  or revoked sooner.       Influenza A by PCR NEGATIVE NEGATIVE Final   Influenza B by PCR NEGATIVE NEGATIVE Final    Comment: (NOTE) The Xpert Xpress SARS-CoV-2/FLU/RSV plus assay is intended as an aid in the diagnosis of influenza from Nasopharyngeal swab specimens and should not be used as a sole basis for treatment. Nasal washings and aspirates are unacceptable for Xpert Xpress SARS-CoV-2/FLU/RSV testing.  Fact Sheet for Patients: EntrepreneurPulse.com.au  Fact Sheet for Healthcare Providers: IncredibleEmployment.be  This test is not yet approved or cleared by the [place] FDA and has been authorized for detection and/or diagnosis of SARS-CoV-2 by FDA under an Emergency Use Authorization (EUA). This EUA will remain in effect (meaning this test can be used) for the duration of the COVID-19 declaration under Section 564(b)(1) of the Act, 21 U.S.C. section 360bbb-3(b)(1), unless the authorization is terminated or revoked.  Performed at [facility], [address], [place], [place] [address]   Blood Culture (routine x 2)     Status: None   Collection Time: 03/22/21  9:19 AM   Specimen: BLOOD  Result Value Ref Range Status   Specimen Description BLOOD BLOOD RIGHT HAND  Final   Special Requests   Final     BOTTLES DRAWN AEROBIC AND ANAEROBIC Blood Culture results may not be optimal due to an inadequate volume of blood received in culture bottles   Culture   Final    NO GROWTH 6 DAYS Performed at [facility], [address], [place], [place] [address]    Report Status 03/28/2021 FINAL  Final  Blood Culture (routine x 2)     Status: None   Collection Time: 03/22/21  9:38 AM   Specimen: BLOOD  Result Value Ref Range Status   Specimen Description BLOOD LEFT ANTECUBITAL  Final   Special Requests   Final    Blood Culture adequate volume BOTTLES DRAWN AEROBIC AND ANAEROBIC   Culture   Final    NO GROWTH 6 DAYS Performed at [facility], [address], [place], [place] [address]    Report Status  03/28/2021 FINAL  Final  MRSA Next Gen by PCR, Nasal     Status: None   Collection Time: 03/22/21  9:24 PM   Specimen: Nasal Mucosa; Nasal Swab  Result Value Ref Range Status   MRSA by PCR Next Gen NOT DETECTED NOT DETECTED Final    Comment: (NOTE) The GeneXpert MRSA Assay (FDA approved for NASAL specimens only), is one component of a comprehensive MRSA colonization surveillance program. It is not intended to diagnose MRSA infection nor to guide or monitor treatment for MRSA infections. Test performance is not FDA approved in patients less than 51 years old. Performed at [facility], [address] [place], [place] [address]      Radiology Studies: DG CHEST PORT 1 VIEW  Result Date: 03/28/2021 CLINICAL DATA:  76 year old female with history of pneumonia. EXAM: PORTABLE CHEST 1 VIEW COMPARISON:  Chest x-ray 03/26/2021. FINDINGS: There is a right upper extremity PICC with tip terminating in the right atrium. Right persistent opacity throughout the left mid to lower hemithorax. Moderate to large left pleural effusion. No definite right pleural effusion. There is cephalization of the pulmonary vasculature, indistinctness of the interstitial markings, and patchy airspace disease throughout the lungs  bilaterally suggestive of moderate pulmonary edema. Mild cardiomegaly. Upper mediastinal contours are distorted by patient rotation to the left. Atherosclerotic calcifications in the thoracic aorta. IMPRESSION: 1. Compared to the prior study, the degree of pulmonary edema appears improved. 2. However, there continues to be extensive airspace consolidation throughout the left mid to lower lung, suggesting pneumonia. 3. Moderate left parapneumonic pleural effusion, unchanged. 4. Aortic atherosclerosis. Electronically Signed   By: Vinnie Langton M.D.   On: 03/28/2021 05:51   DG CHEST PORT 1 VIEW  Result Date: 03/26/2021 CLINICAL DATA:  Shortness of breath; PICC line placement EXAM: PORTABLE CHEST 1 VIEW COMPARISON:  March 14, 2021 FINDINGS: The cardiomediastinal silhouette is unchanged and enlarged in contour.RIGHT upper extremity PICC tip terminates over the superior RIGHT atrium. Small LEFT pleural effusion. Likely trace RIGHT pleural effusion. No pneumothorax. Increased bilateral airspace opacities. Increased LEFT retrocardiac opacification. Visualized abdomen is unremarkable. Multilevel degenerative changes of the thoracic spine. IMPRESSION: 1. RIGHT upper extremity PICC tip terminates over the superior RIGHT atrium. 2. Increased patchy bilateral airspace opacities as well as increased LEFT retrocardiac consolidation. Differential considerations include pulmonary edema with scattered areas of atelectasis versus multifocal infection. Electronically Signed   By: Valentino Saxon M.D.   On: 03/26/2021 17:08   [place] EKG SITE RITE  Result Date: 03/26/2021 If Site Rite image not attached, placement could not be confirmed due to current cardiac rhythm.   Scheduled Meds:  apixaban  5 mg Oral BID   ARIPiprazole  2 mg Oral Daily   atorvastatin  40 mg Oral Daily   Chlorhexidine Gluconate Cloth  6 each Topical Daily   digoxin  0.125 mg Oral Daily   diltiazem  30 mg Oral QID   DULoxetine  120 mg Oral Daily    gabapentin  300 mg Oral TID   guaiFENesin  600 mg Oral BID   insulin aspart  0-9 Units Subcutaneous TID WC   insulin aspart  8 Units Subcutaneous TID WC   insulin glargine-yfgn  24 Units Subcutaneous QHS   ipratropium-albuterol  3 mL Nebulization BID   midodrine  10 mg Oral TID   mirabegron ER  25 mg Oral Daily   mirtazapine  30 mg Oral QHS   pantoprazole  40 mg Oral BID AC   risperiDONE  1 mg Oral BID   Continuous Infusions:  amiodarone 30 mg/hr (03/28/21 1245)     LOS: 6 days   Time spent: 35 mins  Dalani Mette Wynetta Emery, MD How to contact the [facility] Attending or Consulting provider [place] or covering provider during after hours [place], for this patient?  Check the care team in [facility] and look for a) attending/consulting TR[facility] provider listed and b) the [facility] team listed Log into [email or web address] and use [place]'s universal password to access. If you do not have the password, please contact the hospital operator. Locate the [facility] provider you are looking for under [facility] and page to a number that you can be directly reached. If you still have difficulty reaching the provider, please page the [facility] (Director on Call) for the Hospitalists listed on amion for assistance.  03/28/2021, 1:55 PM

## 2021-03-28 NOTE — Plan of Care (Signed)
?  Problem: Acute Rehab PT Goals(only PT should resolve) ?Goal: Pt Will Go Supine/Side To Sit ?Outcome: Progressing ?Flowsheets (Taken 03/28/2021 1420) ?Pt will go Supine/Side to Sit: with minimal assist ?Goal: Patient Will Transfer Sit To/From Stand ?Outcome: Progressing ?Flowsheets (Taken 03/28/2021 1420) ?Patient will transfer sit to/from stand: with minimal assist ?Goal: Pt Will Transfer Bed To Chair/Chair To Bed ?Outcome: Progressing ?Flowsheets (Taken 03/28/2021 1420) ?Pt will Transfer Bed to Chair/Chair to Bed: with min assist ?Goal: Pt Will Ambulate ?Outcome: Progressing ?Flowsheets (Taken 03/28/2021 1420) ?Pt will Ambulate: ? 10 feet ? with moderate assist ? with rolling walker ? with minimal assist ?  ?2:21 PM, 03/28/21 ?Lonell Grandchild, MPT ?Physical Therapist with [place] ?[facility] ?[phone] office ?[phone] mobile phone ? ?

## 2021-03-28 NOTE — Progress Notes (Addendum)
? ?Progress Note ? ?Patient Name: [name] ?Date of Encounter: 03/28/2021 ? ?[facility] Cardiologist: Rozann Lesches, MD   ? ?Subjective  ? ?Breathing ok ? ?Inpatient Medications  ?  ?Scheduled Meds: ? apixaban  5 mg Oral BID  ? ARIPiprazole  2 mg Oral Daily  ? atorvastatin  40 mg Oral Daily  ? Chlorhexidine Gluconate Cloth  6 each Topical Daily  ? digoxin  0.125 mg Oral Daily  ? diltiazem  30 mg Oral QID  ? DULoxetine  120 mg Oral Daily  ? gabapentin  300 mg Oral TID  ? guaiFENesin  600 mg Oral BID  ? insulin aspart  0-9 Units Subcutaneous TID WC  ? insulin aspart  8 Units Subcutaneous TID WC  ? insulin glargine-yfgn  24 Units Subcutaneous QHS  ? ipratropium-albuterol  3 mL Nebulization BID  ? midodrine  10 mg Oral TID  ? mirabegron ER  25 mg Oral Daily  ? mirtazapine  30 mg Oral QHS  ? pantoprazole  40 mg Oral BID AC  ? risperiDONE  1 mg Oral BID  ? ?Continuous Infusions: ? amiodarone 30 mg/hr (03/27/21 1925)  ? azithromycin Stopped (03/27/21 1302)  ? cefTRIAXone (ROCEPHIN)  IV Stopped (03/27/21 1126)  ? ?PRN Meds: ?acetaminophen **OR** acetaminophen, guaiFENesin-dextromethorphan, HYDROcodone-acetaminophen, hydrOXYzine, LORazepam, ondansetron **OR** ondansetron (ZOFRAN) IV  ? ?Vital Signs  ?  ?Vitals:  ? 03/27/21 2019 03/27/21 2100 03/27/21 2137 03/28/21 0404  ?BP: 131/62 (!) 141/62    ?Pulse: (!) 117 (!) 120    ?Resp: (!) 25 (!) 26    ?Temp:   98.5 ?F (36.9 ?C) 98.4 ?F (36.9 ?C)  ?TempSrc:   Oral Axillary  ?SpO2: 100% 95%    ?Weight:    75.8 kg  ?Height:      ? ? ?Intake/Output Summary (Last 24 hours) at 03/28/2021 0754 ?Last data filed at 03/28/2021 0404 ?Gross per 24 hour  ?Intake 556.93 ml  ?Output 2300 ml  ?Net -1743.07 ml  ? ?Last 3 Weights 03/28/2021 03/25/2021 03/24/2021  ?Weight (lbs) 167 lb 1.7 oz 152 lb 12.5 oz 150 lb 12.7 oz  ?Weight (kg) 75.8 kg 69.3 kg 68.4 kg  ?Some encounter information is confidential and restricted. Go to Review Flowsheets activity to see all data.  ?   ? ?Telemetry  ?  ?Afib  110-120/m - Personally Reviewed ? ?ECG  ?  ?  ? ?Physical Exam  ?  ?GEN: No acute distress.   ?Neck: No JVD ?Cardiac: irreg irreg, no murmurs, rubs, or gallops.  ?Respiratory: scattered crackles and rhonchi ?GI: Soft, nontender, non-distended  ?MS: trace edema; No deformity. ?Neuro:  Nonfocal  ?Psych: Normal affect  ? ?Labs  ?  ?High Sensitivity Troponin:   ?Recent Labs  ?Lab 03/09/21 ?1346 03/09/21 ?1527  ?TROPONINIHS 7 7  ?   ?Chemistry ?Recent Labs  ?Lab 03/22/21 ?8676 03/23/21 ?0401 03/24/21 ?0406 03/25/21 ?1950 03/26/21 ?9326 03/27/21 ?7124  ?NA 135 136 135 135 138 133*  ?K 3.8 4.2 4.0 3.9 3.8 3.9  ?CL 96* 103 103 101 101 96*  ?CO2 '27 26 25 28 31 '$ 32  ?GLUCOSE 250* 240* 218* 244* 192* 204*  ?BUN '14 14 10 '$ 7* 8 8  ?CREATININE 0.75 0.64 0.59 0.56 0.62 0.52  ?CALCIUM 8.6* 8.0* 8.0* 8.2* 8.3* 8.1*  ?MG  --   --  1.8 1.8 2.0  --   ?PROT 7.6 6.0*  --   --   --   --   ?ALBUMIN 3.4* 2.5*  --   --   --   --   ?  AST 15 11*  --   --   --   --   ?ALT 12 11  --   --   --   --   ?ALKPHOS 99 66  --   --   --   --   ?BILITOT 0.7 0.9  --   --   --   --   ?GFRNONAA >60 >60 >60 >60 >60 >60  ?ANIONGAP '12 7 7 6 6 5  '$ ?  ?Lipids No results for input(s): CHOL, TRIG, HDL, LABVLDL, LDLCALC, CHOLHDL in the last 168 hours.  ?Hematology ?Recent Labs  ?Lab 03/25/21 ?2426 03/26/21 ?0431 03/27/21 ?0356  ?WBC 9.8 9.3 12.6*  ?RBC 3.53* 3.32* 3.42*  ?HGB 9.9* 9.6* 9.9*  ?HCT 31.7* 30.1* 31.0*  ?MCV 89.8 90.7 90.6  ?MCH 28.0 28.9 28.9  ?MCHC 31.2 31.9 31.9  ?RDW 13.4 13.5 13.6  ?PLT 295 330 374  ? ?Thyroid No results for input(s): TSH, FREET4 in the last 168 hours.  ?BNP ?Recent Labs  ?Lab 03/22/21 ?8341 03/27/21 ?0357  ?BNP 365.0* 276.0*  ?  ?DDimer No results for input(s): DDIMER in the last 168 hours.  ? ?Radiology  ?  ?DG CHEST PORT 1 VIEW ? ?Result Date: 03/28/2021 ?CLINICAL DATA:  76 year old female with history of pneumonia. EXAM: PORTABLE CHEST 1 VIEW COMPARISON:  Chest x-ray 03/26/2021. FINDINGS: There is a right upper extremity PICC with tip  terminating in the right atrium. Right persistent opacity throughout the left mid to lower hemithorax. Moderate to large left pleural effusion. No definite right pleural effusion. There is cephalization of the pulmonary vasculature, indistinctness of the interstitial markings, and patchy airspace disease throughout the lungs bilaterally suggestive of moderate pulmonary edema. Mild cardiomegaly. Upper mediastinal contours are distorted by patient rotation to the left. Atherosclerotic calcifications in the thoracic aorta. IMPRESSION: 1. Compared to the prior study, the degree of pulmonary edema appears improved. 2. However, there continues to be extensive airspace consolidation throughout the left mid to lower lung, suggesting pneumonia. 3. Moderate left parapneumonic pleural effusion, unchanged. 4. Aortic atherosclerosis. Electronically Signed   By: Vinnie Langton M.D.   On: 03/28/2021 05:51  ? ?DG CHEST PORT 1 VIEW ? ?Result Date: 03/26/2021 ?CLINICAL DATA:  Shortness of breath; PICC line placement EXAM: PORTABLE CHEST 1 VIEW COMPARISON:  March 14, 2021 FINDINGS: The cardiomediastinal silhouette is unchanged and enlarged in contour.RIGHT upper extremity PICC tip terminates over the superior RIGHT atrium. Small LEFT pleural effusion. Likely trace RIGHT pleural effusion. No pneumothorax. Increased bilateral airspace opacities. Increased LEFT retrocardiac opacification. Visualized abdomen is unremarkable. Multilevel degenerative changes of the thoracic spine. IMPRESSION: 1. RIGHT upper extremity PICC tip terminates over the superior RIGHT atrium. 2. Increased patchy bilateral airspace opacities as well as increased LEFT retrocardiac consolidation. Differential considerations include pulmonary edema with scattered areas of atelectasis versus multifocal infection. Electronically Signed   By: Valentino Saxon M.D.   On: 03/26/2021 17:08  ? ?[place] EKG SITE RITE ? ?Result Date: 03/26/2021 ?If [employer] not attached,  placement could not be confirmed due to current cardiac rhythm.  ? ?Cardiac Studies  ? ?  ? ?Patient Profile  ?   ?76 y.o. female with a hx of PAF who is being seen 03/23/2021 for the evaluation of tachycardia  at the request of Dr Tat.  ? ?Assessment & Plan  ?  ?Pneumonia/Hypoxia ?- per primary team, initially on bipap now weaned to [place], CXR this am shows ongoing pneumonia ?  ?  ? PAF ?-  presented with afib with RVR in setting of pneumonia, hypoxia ?- historically rate control has been affected by low bp's as outpatient ?- home regimen dilt 120, toprol 75, eliquis '5mg'$  bid ?-low bp's on IV diltiazem, changed to IV amiodarone. Started on oral digoxin 0.'125mg'$  daily  ?- afib should improve as systemic illness resolves and overtime would be able to get back to her home regimen. ?  ?- rates remained elevated 110-120.She was Rebolused amio '150mg'$  on Fri and remains on drip at '30mg'$ /hr. Continue oral digoxin. BP's improved, also on oral diltiazem '30mg'$  oral qid with hold parameters. If issues with soft bp's can d/c diltiazem and could IV load digoxin 0.'25mg'$  IV q 6 hrs x 3 doses then resume oral maintence 0.'125mg'$  daily per Dr. Harl Bowie. AMio and dig would be short term medications. overall appears to be tolerating Afib. Continue current meds. ?  ?  ?Orthostatic hypotension ?- has been on midodrine at home, continued here.  ?  ?   ? ?For questions or updates, please contact [place] ?Please consult [email or web address] for contact info under  ? ?  ?   ?Signed, ?Ermalinda Barrios, PA-C  ?03/28/2021, 7:54 AM   ? ?Patient examined chart reviewed Exam with edentulous elderly female rhonchi bilaterally no murmur no edema As indicated rate control will be difficult in setting of pneumonia Continue amiodarone low dose cardizem and digoxin  ? ?Jenkins Rouge MD [facility] ? ?

## 2021-03-29 ENCOUNTER — Inpatient Hospital Stay (HOSPITAL_COMMUNITY): Payer: Medicare Other

## 2021-03-29 DIAGNOSIS — R600 Localized edema: Secondary | ICD-10-CM | POA: Diagnosis not present

## 2021-03-29 DIAGNOSIS — J9601 Acute respiratory failure with hypoxia: Secondary | ICD-10-CM | POA: Diagnosis not present

## 2021-03-29 DIAGNOSIS — I4891 Unspecified atrial fibrillation: Secondary | ICD-10-CM | POA: Diagnosis not present

## 2021-03-29 DIAGNOSIS — I10 Essential (primary) hypertension: Secondary | ICD-10-CM | POA: Diagnosis not present

## 2021-03-29 LAB — CBC WITH DIFFERENTIAL/PLATELET
Abs Immature Granulocytes: 0.05 10*3/uL (ref 0.00–0.07)
Basophils Absolute: 0 10*3/uL (ref 0.0–0.1)
Basophils Relative: 0 %
Eosinophils Absolute: 0.5 10*3/uL (ref 0.0–0.5)
Eosinophils Relative: 6 %
HCT: 29.7 % — ABNORMAL LOW (ref 36.0–46.0)
Hemoglobin: 9.3 g/dL — ABNORMAL LOW (ref 12.0–15.0)
Immature Granulocytes: 1 %
Lymphocytes Relative: 12 %
Lymphs Abs: 1 10*3/uL (ref 0.7–4.0)
MCH: 27.7 pg (ref 26.0–34.0)
MCHC: 31.3 g/dL (ref 30.0–36.0)
MCV: 88.4 fL (ref 80.0–100.0)
Monocytes Absolute: 0.5 10*3/uL (ref 0.1–1.0)
Monocytes Relative: 6 %
Neutro Abs: 6.1 10*3/uL (ref 1.7–7.7)
Neutrophils Relative %: 75 %
Platelets: 369 10*3/uL (ref 150–400)
RBC: 3.36 MIL/uL — ABNORMAL LOW (ref 3.87–5.11)
RDW: 13.2 % (ref 11.5–15.5)
WBC: 8.2 10*3/uL (ref 4.0–10.5)
nRBC: 0 % (ref 0.0–0.2)

## 2021-03-29 LAB — BASIC METABOLIC PANEL
Anion gap: 7 (ref 5–15)
BUN: 9 mg/dL (ref 8–23)
CO2: 34 mmol/L — ABNORMAL HIGH (ref 22–32)
Calcium: 8 mg/dL — ABNORMAL LOW (ref 8.9–10.3)
Chloride: 97 mmol/L — ABNORMAL LOW (ref 98–111)
Creatinine, Ser: 0.5 mg/dL (ref 0.44–1.00)
GFR, Estimated: >60 mL/min (ref >60)
Glucose, Bld: 168 mg/dL — ABNORMAL HIGH (ref 70–99)
Potassium: 3.5 mmol/L (ref 3.5–5.1)
Sodium: 138 mmol/L (ref 135–145)

## 2021-03-29 LAB — GLUCOSE, CAPILLARY
Glucose-Capillary: 171 mg/dL — ABNORMAL HIGH (ref 70–99)
Glucose-Capillary: 165 mg/dL — ABNORMAL HIGH (ref 70–99)
Glucose-Capillary: 222 mg/dL — ABNORMAL HIGH (ref 70–99)
Glucose-Capillary: 280 mg/dL — ABNORMAL HIGH (ref 70–99)
Glucose-Capillary: 199 mg/dL — ABNORMAL HIGH (ref 70–99)

## 2021-03-29 LAB — MAGNESIUM: Magnesium: 2 mg/dL (ref 1.7–2.4)

## 2021-03-29 MED ORDER — MUSCLE RUB 10-15 % EX CREA
TOPICAL_CREAM | CUTANEOUS | Status: DC | PRN
  Filled 2021-03-29: qty 85

## 2021-03-29 MED ORDER — FUROSEMIDE 10 MG/ML IJ SOLN
20.0000 mg | Freq: Once | INTRAMUSCULAR | Status: AC
  Administered 2021-03-29: 20 mg via INTRAVENOUS
  Filled 2021-03-29: qty 2

## 2021-03-29 NOTE — Progress Notes (Signed)
Transition of Care (TOC) -30 day Note   ?  ?  ?Patient Details  ?Name: Pailyn Bellevue ?MRN: [id number] ?Date of Birth: [birth year] ?  ?Transition of Care (TOC) CM/SW Contact  ?Name: Shade Flood ?Phone Number: [phone] ?Date: 03/29/2021 ?Time: 11:03 ?  ?MUST ID:  ?  ?To Whom it May Concern: ?  ?Please be advised that the above patient will require a short-term nursing home stay, anticipated 30 days or less rehabilitation and strengthening. The plan is for return home.  ?  ? ? ?

## 2021-03-29 NOTE — Progress Notes (Signed)
Walked in patient room, patient had [place] off with sat of 98%.  Turned flow meter down to 1L just in case she needs it. ?

## 2021-03-29 NOTE — Progress Notes (Signed)
PROGRESS NOTE   [name]  HXT:[id number] DOB: 13-Apr-1945 DOA: 03/22/2021 PCP: Carrolyn Meiers, MD   Chief Complaint  Patient presents with   Shortness of Breath   Level of care: Stepdown  Brief Admission History:  76 year old female with a history of paroxysmal atrial fibrillation on apixaban, hypertension, orthostatic hypotension, anxiety/depression, diabetes mellitus type 2, hyperlipidemia, CVA, chronic pain, schizophrenia, GERD presenting with 2-day history of coughing and shortness of breath from high [place].  History is limited secondary to patient being in extremis.  Upon EMS arrival, the patient was noted to have oxygen saturation 80% on room air.  She was noted to have atrial fibrillation with RVR.  The patient denies any chest pain but complains of shortness of breath and coughing.  There is no history of nausea, vomiting, diarrhea, domino pain.  In the ED, the patient had a fever up to 101.7 F with tachycardia initially in the 170s.  She was hemodynamically stable.  She was placed on BiPAP.  She was started on vancomycin, cefepime, metronidazole.  Chest x-ray showed bilateral interstitial and airspace opacities.  Lactic acid was 2.5 >>2.1.  Sodium 135, potassium 3.8, bicarbonate 27, serum creatinine 0.75.  LFTs were unremarkable.  WBC 14.0, hemoglobin 12.0, platelets 295,000.  03/23/2021:  pt taken off bipap in AM, on nasal cannula.  Cardiology stopped IV diltiazem and lopressor and started IV amiodarone.    03/24/2021: Afib RVR persists overnight, remains on IV amiodarone.  Cardio added digoxin.  Still with dyspnea symptoms and palpitations.    03/25/2021: Pt reports that she still has shortness of breath and palpitations and her anxiety is out of control. She remains on IV amiodarone. Additional IV bolus ordered by Dr. Harl Bowie.   03/26/2021: remains in afibRVR, on IV amio, oral diltiazem, oral digoxin, HR slightly better.  Complains of weakness, but no CP or SOB, c/o feeling  anxious.   03/27/2021: Afib still difficult to control. PICC line placed 3/4. Brief time patient was off IV amiod on 3/4 due to no IV access.  BP tolerating oral diltiazem.  Pt remains very anxious.  She reports palpitations.  She denies cough, chest pain and SOB. Plan to get up to chair today.    03/28/2021: Breathing much improved.  Back to baseline oxygen requirements.  Afib RVR persists on IV amiodarone, cardizem, digoxin.  Planning PT eval when heart rate better controlled for better idea of disposition.    03/29/2021: HR starting to improve. Completed full course of antibiotics for pneumonia.  Working on ambulating and working on discharge placement to rehab.  Cardiology says could possibly transition to oral agents 3/8.    Assessment and Plan: * Acute respiratory failure with hypoxia (HCC) Secondary to pneumonia and afib RVR Off BiPAP--she has been weaned back to RA  Paroxysmal atrial fibrillation (HCC) Currently in RVR cardiology team added oral diltiazem 3/3 Remains on IV amiodarone and oral digoxin and oral cardizem   Sepsis due to undetermined organism ([place]) SEPSIS PHYSIOLOGY RESOLVED NOW Present on admission Due to pneumonia Presented with fever, leukocytosis and respiratory failure - all treated and resolved Lactic 2.5>>2.1 Completed IVF Completed course of IV abx 03/28/21 Blood culture no growth to date.    Atrial fibrillation with RVR (HCC) IV amiodarone per cardiology starting 03/23/21, dig added 3/2 Remains uncontrolled.  Additional IV bolus ordered on 3/3 by Dr. Harl Bowie Digoxin per cardiology.   ( brief period off IV amio on 3/4 due to lost IV access) PICC line now in  place Possible transition to oral agents on 3/8 per cardiology.   Schizophrenia (HCC) Continue Abilify, Risperdal, Remeron, Cymbalta  Uncontrolled type 2 diabetes mellitus with hyperglycemia, with long-term current use of insulin (HCC) Continue Semglee 24 units plus novolog 8 unit TIDAC, plus SSI, CBG 5  times per day NovoLog sliding scale hemoglobin A1c 7.1%.    CBG (last 3)  Recent Labs    03/29/21 0741 03/29/21 1124 03/29/21 1558  GLUCAP 165* 222* 280*     Lobar pneumonia (HCC) Treated with ceftriaxone/azithromycin for full 7 days MRSA screen negative  Not surprised that there is clinical improvement before chest xray improves  Recommend patient get PA/Lat CXR in 4-6 weeks to document resolution.   Orthostasis Continue home midodrine  Mixed hyperlipidemia Continue statin  Essential hypertension, benign Now back on oral cardizem per cardiology service.  BP tolerating oral cardizem so far.    DVT prophylaxis: apixaban  Code Status: full  Family Communication:  Disposition: Status is: Inpatient Remains inpatient appropriate because: IV abx, IV amiodarone infusion  Consultants:  Cardiology  Procedures:   Antimicrobials:  Cefepime 3/1>>3/2 Ceftriaxone 3/2>3/6 Azithromycin 3/1>3/6  Subjective: Pt denies any SOB symptoms.  Pt has been able to get up in chair and work with PT.       Objective: Vitals:   03/29/21 1600 03/29/21 1602 03/29/21 1630 03/29/21 1700  BP: (!) 125/54 (!) 125/54 111/65 121/73  Pulse: 96 (!) 117 (!) 122 92  Resp: (!) '23 20 16 17  '$ Temp: 98.6 F (37 C)     TempSrc: Oral     SpO2: 99% 99% 98% 99%  Weight:      Height:        Intake/Output Summary (Last 24 hours) at 03/29/2021 1758 Last data filed at 03/29/2021 1502 Gross per 24 hour  Intake 609.39 ml  Output 2825 ml  Net -2215.61 ml   Filed Weights   03/24/21 0500 03/25/21 0600 03/28/21 0404  Weight: 68.4 kg 69.3 kg 75.8 kg   Examination:  General exam: Appears calm and comfortable  Respiratory system: shallow breathing bilateral no increased work of breathing.  Cardiovascular system: normal S1 & S2 heard. Irregularly irregular and tachycardic, No JVD, murmurs, rubs, gallops or clicks. No pedal edema. Gastrointestinal system: Abdomen is nondistended, soft and nontender. No  organomegaly or masses felt. Normal bowel sounds heard. Central nervous system: Alert and oriented. No focal neurological deficits. Extremities: Symmetric 5 x 5 power. Skin: No rashes, lesions or ulcers. Psychiatry: Judgement and insight appear poor. Mood & affect flat.   Data Reviewed: I have personally reviewed following labs and imaging studies  CBC: Recent Labs  Lab 03/24/21 0406 03/25/21 0442 03/26/21 0431 03/27/21 0356 03/29/21 0430  WBC 11.7* 9.8 9.3 12.6* 8.2  NEUTROABS 9.7* 7.9* 6.3 9.9* 6.1  HGB 9.3* 9.9* 9.6* 9.9* 9.3*  HCT 30.7* 31.7* 30.1* 31.0* 29.7*  MCV 91.9 89.8 90.7 90.6 88.4  PLT 277 295 330 374 921    Basic Metabolic Panel: Recent Labs  Lab 03/24/21 0406 03/25/21 0442 03/26/21 0431 03/27/21 0356 03/28/21 0737 03/29/21 0430  NA 135 135 138 133* 135 138  K 4.0 3.9 3.8 3.9 3.9 3.5  CL 103 101 101 96* 94* 97*  CO2 '25 28 31 '$ 32 33* 34*  GLUCOSE 218* 244* 192* 204* 235* 168*  BUN 10 7* '8 8 9 9  '$ CREATININE 0.59 0.56 0.62 0.52 0.58 0.50  CALCIUM 8.0* 8.2* 8.3* 8.1* 8.1* 8.0*  MG 1.8 1.8 2.0  --   --  2.0    CBG: Recent Labs  Lab 03/28/21 2134 03/29/21 0313 03/29/21 0741 03/29/21 1124 03/29/21 1558  GLUCAP 172* 171* 165* 222* 280*    Recent Results (from the past 240 hour(s))  Resp Panel by RT-PCR (Flu A&B, Covid) Nasopharyngeal Swab     Status: None   Collection Time: 03/22/21  9:07 AM   Specimen: Nasopharyngeal Swab; Nasopharyngeal(NP) swabs in vial transport medium  Result Value Ref Range Status   SARS Coronavirus 2 by RT PCR NEGATIVE NEGATIVE Final    Comment: (NOTE) SARS-CoV-2 target nucleic acids are NOT DETECTED.  The SARS-CoV-2 RNA is generally detectable in upper respiratory specimens during the acute phase of infection. The lowest concentration of SARS-CoV-2 viral copies this assay can detect is 138 copies/mL. A negative result does not preclude SARS-Cov-2 infection and should not be used as the sole basis for treatment or other  patient management decisions. A negative result may occur with  improper specimen collection/handling, submission of specimen other than nasopharyngeal swab, presence of viral mutation(s) within the areas targeted by this assay, and inadequate number of viral copies(<138 copies/mL). A negative result must be combined with clinical observations, patient history, and epidemiological information. The expected result is Negative.  Fact Sheet for Patients:  EntrepreneurPulse.com.au  Fact Sheet for Healthcare Providers:  IncredibleEmployment.be  This test is no t yet approved or cleared by the [place] FDA and  has been authorized for detection and/or diagnosis of SARS-CoV-2 by FDA under an Emergency Use Authorization (EUA). This EUA will remain  in effect (meaning this test can be used) for the duration of the COVID-19 declaration under Section 564(b)(1) of the Act, 21 U.S.C.section 360bbb-3(b)(1), unless the authorization is terminated  or revoked sooner.       Influenza A by PCR NEGATIVE NEGATIVE Final   Influenza B by PCR NEGATIVE NEGATIVE Final    Comment: (NOTE) The Xpert Xpress SARS-CoV-2/FLU/RSV plus assay is intended as an aid in the diagnosis of influenza from Nasopharyngeal swab specimens and should not be used as a sole basis for treatment. Nasal washings and aspirates are unacceptable for Xpert Xpress SARS-CoV-2/FLU/RSV testing.  Fact Sheet for Patients: EntrepreneurPulse.com.au  Fact Sheet for Healthcare Providers: IncredibleEmployment.be  This test is not yet approved or cleared by the [place] FDA and has been authorized for detection and/or diagnosis of SARS-CoV-2 by FDA under an Emergency Use Authorization (EUA). This EUA will remain in effect (meaning this test can be used) for the duration of the COVID-19 declaration under Section 564(b)(1) of the Act, 21 U.S.C. section  360bbb-3(b)(1), unless the authorization is terminated or revoked.  Performed at [facility], [address] [place], [place] [address]   Blood Culture (routine x 2)     Status: None   Collection Time: 03/22/21  9:19 AM   Specimen: BLOOD  Result Value Ref Range Status   Specimen Description BLOOD BLOOD RIGHT HAND  Final   Special Requests   Final    BOTTLES DRAWN AEROBIC AND ANAEROBIC Blood Culture results may not be optimal due to an inadequate volume of blood received in culture bottles   Culture   Final    NO GROWTH 6 DAYS Performed at [facility], [address], [place], [place] [address]    Report Status 03/28/2021 FINAL  Final  Blood Culture (routine x 2)     Status: None   Collection Time: 03/22/21  9:38 AM   Specimen: BLOOD  Result Value Ref Range Status   Specimen  Description BLOOD LEFT ANTECUBITAL  Final   Special Requests   Final    Blood Culture adequate volume BOTTLES DRAWN AEROBIC AND ANAEROBIC   Culture   Final    NO GROWTH 6 DAYS Performed at [facility], [address] [place], [place] [address]    Report Status 03/28/2021 FINAL  Final  MRSA Next Gen by PCR, Nasal     Status: None   Collection Time: 03/22/21  9:24 PM   Specimen: Nasal Mucosa; Nasal Swab  Result Value Ref Range Status   MRSA by PCR Next Gen NOT DETECTED NOT DETECTED Final    Comment: (NOTE) The GeneXpert MRSA Assay (FDA approved for NASAL specimens only), is one component of a comprehensive MRSA colonization surveillance program. It is not intended to diagnose MRSA infection nor to guide or monitor treatment for MRSA infections. Test performance is not FDA approved in patients less than 28 years old. Performed at [facility], [address] [place], [place] [address]      Radiology Studies: US Venous Img Upper Uni Right(DVT)  Result Date: 03/29/2021 CLINICAL DATA:  Right forearm edema. History of right upper extremity approach PICC line placement. Evaluate for DVT. EXAM: RIGHT UPPER  EXTREMITY VENOUS DOPPLER ULTRASOUND TECHNIQUE: Gray-scale sonography with graded compression, as well as color Doppler and duplex ultrasound were performed to evaluate the upper extremity deep venous system from the level of the subclavian vein and including the jugular, axillary, basilic, radial, ulnar and upper cephalic vein. Spectral Doppler was utilized to evaluate flow at rest and with distal augmentation maneuvers. COMPARISON:  Chest radiograph-03/28/2021 FINDINGS: Contralateral Subclavian Vein: Respiratory phasicity is normal and symmetric with the symptomatic side. No evidence of thrombus. Normal compressibility. Internal Jugular Vein: No evidence of thrombus. Normal compressibility, respiratory phasicity and response to augmentation. Subclavian Vein: No evidence of thrombus. Normal compressibility, respiratory phasicity and response to augmentation. Axillary Vein: No evidence of thrombus. Normal compressibility, respiratory phasicity and response to augmentation. Cephalic Vein: No evidence of thrombus. Normal compressibility, respiratory phasicity and response to augmentation. Basilic Vein: No evidence of thrombus. Normal compressibility, respiratory phasicity and response to augmentation. Brachial Veins: No evidence of thrombus. Normal compressibility, respiratory phasicity and response to augmentation. Radial Veins: No evidence of thrombus. Normal compressibility, respiratory phasicity and response to augmentation. Ulnar Veins: No evidence of thrombus. Normal compressibility, respiratory phasicity and response to augmentation. Venous Reflux:  None visualized. Other Findings:  None visualized. IMPRESSION: No evidence of DVT within the right upper extremity. Electronically Signed   By: Sandi Mariscal M.D.   On: 03/29/2021 12:59   DG CHEST PORT 1 VIEW  Result Date: 03/28/2021 CLINICAL DATA:  76 year old female with history of pneumonia. EXAM: PORTABLE CHEST 1 VIEW COMPARISON:  Chest x-ray 03/26/2021.  FINDINGS: There is a right upper extremity PICC with tip terminating in the right atrium. Right persistent opacity throughout the left mid to lower hemithorax. Moderate to large left pleural effusion. No definite right pleural effusion. There is cephalization of the pulmonary vasculature, indistinctness of the interstitial markings, and patchy airspace disease throughout the lungs bilaterally suggestive of moderate pulmonary edema. Mild cardiomegaly. Upper mediastinal contours are distorted by patient rotation to the left. Atherosclerotic calcifications in the thoracic aorta. IMPRESSION: 1. Compared to the prior study, the degree of pulmonary edema appears improved. 2. However, there continues to be extensive airspace consolidation throughout the left mid to lower lung, suggesting pneumonia. 3. Moderate left parapneumonic pleural effusion, unchanged. 4. Aortic atherosclerosis. Electronically Signed   By: Quillian Quince  Entrikin M.D.   On: 03/28/2021 05:51    Scheduled Meds:  apixaban  5 mg Oral BID   ARIPiprazole  2 mg Oral Daily   atorvastatin  40 mg Oral Daily   Chlorhexidine Gluconate Cloth  6 each Topical Daily   digoxin  0.125 mg Oral Daily   diltiazem  30 mg Oral QID   DULoxetine  120 mg Oral Daily   gabapentin  300 mg Oral TID   guaiFENesin  600 mg Oral BID   insulin aspart  0-9 Units Subcutaneous TID WC   insulin aspart  8 Units Subcutaneous TID WC   insulin glargine-yfgn  24 Units Subcutaneous QHS   ipratropium-albuterol  3 mL Nebulization BID   midodrine  10 mg Oral TID   mirabegron ER  25 mg Oral Daily   mirtazapine  30 mg Oral QHS   pantoprazole  40 mg Oral BID AC   risperiDONE  1 mg Oral BID   Continuous Infusions:  amiodarone 30 mg/hr (03/29/21 1052)     LOS: 7 days   Time spent: 35 mins  Emme Rosenau Wynetta Emery, MD How to contact the [facility] Attending or Consulting provider [place] or covering provider during after hours [place], for this patient?  Check the care team in [facility] and look for  a) attending/consulting [facility] provider listed and b) the [facility] team listed Log into [email or web address] and use [place]'s universal password to access. If you do not have the password, please contact the hospital operator. Locate the [facility] provider you are looking for under [facility] and page to a number that you can be directly reached. If you still have difficulty reaching the provider, please page the [facility] (Director on Call) for the Hospitalists listed on amion for assistance.  03/29/2021, 5:58 PM

## 2021-03-29 NOTE — [place] FL2 (Signed)
?[place] MEDICAID FL2 LEVEL OF CARE SCREENING TOOL  ?  ? ?IDENTIFICATION  ?Patient Name: ?[name] Birthdate: 12-05-1945 Sex: female Admission Date (Current Location): ?03/22/2021  ?[place] and [place] Number: ? [place] and Address:  ?[facility] [address] [place] ?     Provider Number: ?[id number]  ?Attending Physician Name and Address:  ?Murlean Iba, MD ? Relative Name and Phone Number:  ?  ?   ?Current Level of Care: ?Hospital Recommended Level of Care: ?[place] Prior Approval Number: ?  ? ?Date Approved/Denied: ?  PASRR Number: ?  ? ?Discharge Plan: ?SNF ?  ? ?Current Diagnoses: ?Patient Active Problem List  ? Diagnosis Date Noted  ? Acute respiratory failure with hypoxia ([place]) 03/22/2021  ? Lobar pneumonia ([place]) 03/22/2021  ? Sepsis due to undetermined organism ([place]) 03/22/2021  ? Paroxysmal atrial fibrillation ([place]) 03/22/2021  ? Nausea without vomiting 11/17/2020  ? Hyperthyroidism   ? Hypokalemia   ? SBO (small bowel obstruction) ([place]) 04/18/2020  ? Chest pain 02/21/2020  ? Hemorrhoids 11/26/2019  ? Atrial fibrillation with RVR ([place]) 10/04/2019  ? GERD (gastroesophageal reflux disease) 08/26/2019  ? Loss of weight 08/26/2019  ? Constipation 09/09/2018  ? Hypotension 03/21/2018  ? Diarrhea 07/20/2017  ? Primary osteoarthritis of right hip 04/18/2017  ? Orthostasis 03/28/2017  ? General weakness 03/28/2017  ? History of cerebrovascular accident (CVA) with residual deficit 03/28/2017  ? Schizophrenia ([place]) 03/28/2017  ? Generalized weakness 03/28/2017  ? Orthostatic hypotension   ? IBS (irritable bowel syndrome) 10/16/2016  ? Left hemiparesis ([place]) 10/06/2016  ? Left-sided weakness 10/06/2016  ? Chronic superficial gastritis without bleeding   ? Stricture and stenosis of esophagus   ? Nausea with vomiting 04/20/2016  ? Abdominal pain, acute, bilateral lower quadrant 03/22/2016  ? Lesion of right lobe of liver 03/22/2016  ? Elevated  troponin 02/13/2016  ? IDA (iron deficiency anemia) 04/06/2015  ? Mixed hyperlipidemia 11/06/2014  ? Obesity due to excess calories 11/06/2014  ? Sedentary lifestyle 11/06/2014  ? Rectal bleeding 10/21/2014  ? Uncontrolled type 2 diabetes mellitus with hyperglycemia, with long-term current use of insulin ([place]) 03/24/2010  ? Essential hypertension, benign 03/24/2010  ? DVT, lower extremity, recurrent ([place]) 03/24/2010  ? History of cardiovascular disorder 03/24/2010  ? ? ?Orientation RESPIRATION BLADDER Height & Weight   ?  ?Self, Time, Situation, Place ? Normal Incontinent, External catheter Weight: 167 lb 1.7 oz (75.8 kg) ?Height:  '5\' 6"'$  (167.6 cm) (pt stated)  ?BEHAVIORAL SYMPTOMS/MOOD NEUROLOGICAL BOWEL NUTRITION STATUS  ?    Continent  (see dc summary)  ?AMBULATORY STATUS COMMUNICATION OF NEEDS Skin   ?Extensive Assist Verbally Normal ?  ?  ?  ?    ?     ?     ? ? ?Personal Care Assistance Level of Assistance  ?Bathing, Feeding, Dressing Bathing Assistance: Limited assistance ?Feeding assistance: Independent ?Dressing Assistance: Limited assistance ?   ? ?Functional Limitations Info  ?Sight, Hearing, Speech Sight Info: Adequate ?Hearing Info: Adequate ?Speech Info: Adequate  ? ? ?SPECIAL CARE FACTORS FREQUENCY  ?PT (By licensed PT), OT (By licensed OT)   ?  ?PT Frequency: 5x week ?OT Frequency: 3x week ?  ?  ?  ?   ? ? ?Contractures Contractures Info: Not present  ? ? ?Additional Factors Info  ?Psychotropic Code Status Info: FULL Code ?Allergies Info: sulfa/antibiotics ?Psychotropic Info: Abilify, Klonopin, Cymbalta, Risperdal ?  ?  ?   ? ?  Current Medications (03/29/2021):  This is the current hospital active medication list ?Current Facility-Administered Medications  ?Medication Dose Route Frequency Provider Last Rate Last Admin  ? acetaminophen (TYLENOL) tablet 650 mg  650 mg Oral Q6H PRN Orson Eva, MD   650 mg at 03/29/21 0813  ? Or  ? acetaminophen (TYLENOL) suppository 650 mg  650 mg Rectal Q6H PRN Tat,  David, MD   650 mg at 03/22/21 1851  ? amiodarone (NEXTERONE PREMIX) 360-4.14 MG/200ML-% (1.8 mg/mL) IV infusion  30 mg/hr Intravenous Continuous Chandrasekhar, Mahesh A, MD 16.67 mL/hr at 03/29/21 1052 30 mg/hr at 03/29/21 1052  ? apixaban (ELIQUIS) tablet 5 mg  5 mg Oral BID Orson Eva, MD   5 mg at 03/29/21 0805  ? ARIPiprazole (ABILIFY) tablet 2 mg  2 mg Oral Daily Tat, David, MD   2 mg at 03/29/21 0805  ? atorvastatin (LIPITOR) tablet 40 mg  40 mg Oral Daily Tat, Shanon Brow, MD   40 mg at 03/29/21 0804  ? Chlorhexidine Gluconate Cloth 2 % PADS 6 each  6 each Topical Daily Opyd, Ilene Qua, MD   6 each at 03/29/21 [phone]  ? digoxin (LANOXIN) tablet 0.125 mg  0.125 mg Oral Daily Chandrasekhar, Mahesh A, MD   0.125 mg at 03/29/21 0804  ? diltiazem (CARDIZEM) tablet 30 mg  30 mg Oral QID Arnoldo Lenis, MD   30 mg at 03/29/21 0804  ? DULoxetine (CYMBALTA) DR capsule 120 mg  120 mg Oral Daily Tat, David, MD   120 mg at 03/29/21 5366  ? gabapentin (NEURONTIN) capsule 300 mg  300 mg Oral TID Orson Eva, MD   300 mg at 03/29/21 0805  ? guaiFENesin (MUCINEX) 12 hr tablet 600 mg  600 mg Oral BID Wynetta Emery, Clanford L, MD   600 mg at 03/29/21 0805  ? guaiFENesin-dextromethorphan (ROBITUSSIN DM) 100-10 MG/5ML syrup 5 mL  5 mL Oral Q4H PRN Johnson, Clanford L, MD   5 mL at 03/28/21 1708  ? HYDROcodone-acetaminophen (NORCO) 10-325 MG per tablet 1 tablet  1 tablet Oral Q6H PRN Orson Eva, MD   1 tablet at 03/29/21 4403  ? hydrOXYzine (ATARAX) tablet 25 mg  25 mg Oral Q8H PRN Orson Eva, MD   25 mg at 03/29/21 4742  ? insulin aspart (novoLOG) injection 0-9 Units  0-9 Units Subcutaneous TID WC Irwin Brakeman L, MD   2 Units at 03/29/21 0809  ? insulin aspart (novoLOG) injection 8 Units  8 Units Subcutaneous TID WC Wynetta Emery, Clanford L, MD   8 Units at 03/28/21 1252  ? insulin glargine-yfgn (SEMGLEE) injection 24 Units  24 Units Subcutaneous QHS Murlean Iba, MD   24 Units at 03/28/21 2333  ? ipratropium-albuterol (DUONEB)  0.5-2.5 (3) MG/3ML nebulizer solution 3 mL  3 mL Nebulization BID Wynetta Emery, Clanford L, MD   3 mL at 03/29/21 0752  ? LORazepam (ATIVAN) injection 1 mg  1 mg Intravenous Q6H PRN Orson Eva, MD   1 mg at 03/29/21 0940  ? midodrine (PROAMATINE) tablet 10 mg  10 mg Oral TID Orson Eva, MD   10 mg at 03/29/21 0805  ? mirabegron ER (MYRBETRIQ) tablet 25 mg  25 mg Oral Daily Tat, David, MD   25 mg at 03/29/21 0805  ? mirtazapine (REMERON) tablet 30 mg  30 mg Oral Benay Pike, MD   30 mg at 03/28/21 2332  ? Muscle Rub CREA   Topical PRN Murlean Iba, MD   Given at 03/29/21 1052  ?  ondansetron (ZOFRAN) tablet 4 mg  4 mg Oral Q6H PRN Tat, David, MD      ? Or  ? ondansetron (ZOFRAN) injection 4 mg  4 mg Intravenous Q6H PRN Orson Eva, MD   4 mg at 03/26/21 1356  ? pantoprazole (PROTONIX) EC tablet 40 mg  40 mg Oral BID Mina Marble, MD   40 mg at 03/29/21 0804  ? risperiDONE (RISPERDAL) tablet 1 mg  1 mg Oral BID Tat, Shanon Brow, MD   1 mg at 03/29/21 6924  ? ? ? ?Discharge Medications: ?Please see discharge summary for a list of discharge medications. ? ?Relevant Imaging Results: ? ?Relevant Lab Results: ? ? ?Additional Information ?Pt SSN: [id number] ? ?Shade Flood, LCSW ? ? ? ? ?

## 2021-03-29 NOTE — Progress Notes (Addendum)
Progress Note  Patient Name: [name] Date of Encounter: 03/29/2021  [facility] Cardiologist: Rozann Lesches, MD    Subjective   Complains of neck aches, breathing ok  Inpatient Medications    Scheduled Meds:  apixaban  5 mg Oral BID   ARIPiprazole  2 mg Oral Daily   atorvastatin  40 mg Oral Daily   Chlorhexidine Gluconate Cloth  6 each Topical Daily   digoxin  0.125 mg Oral Daily   diltiazem  30 mg Oral QID   DULoxetine  120 mg Oral Daily   gabapentin  300 mg Oral TID   guaiFENesin  600 mg Oral BID   insulin aspart  0-9 Units Subcutaneous TID WC   insulin aspart  8 Units Subcutaneous TID WC   insulin glargine-yfgn  24 Units Subcutaneous QHS   ipratropium-albuterol  3 mL Nebulization BID   midodrine  10 mg Oral TID   mirabegron ER  25 mg Oral Daily   mirtazapine  30 mg Oral QHS   pantoprazole  40 mg Oral BID AC   risperiDONE  1 mg Oral BID   Continuous Infusions:  amiodarone 30 mg/hr (03/28/21 2255)   PRN Meds: acetaminophen **OR** acetaminophen, guaiFENesin-dextromethorphan, HYDROcodone-acetaminophen, hydrOXYzine, LORazepam, ondansetron **OR** ondansetron (ZOFRAN) IV   Vital Signs    Vitals:   03/29/21 0730 03/29/21 0742 03/29/21 0753 03/29/21 0800  BP: 124/62   119/83  Pulse: (!) 121 (!) 113 (!) 127 (!) 113  Resp: 16 (!) '21 15 20  '$ Temp:  98.8 F (37.1 C)    TempSrc:  Oral    SpO2: 97% 98% 100% 91%  Weight:      Height:        Intake/Output Summary (Last 24 hours) at 03/29/2021 0821 Last data filed at 03/29/2021 0258 Gross per 24 hour  Intake 840.23 ml  Output 1625 ml  Net -784.77 ml   Last 3 Weights 03/28/2021 03/25/2021 03/24/2021  Weight (lbs) 167 lb 1.7 oz 152 lb 12.5 oz 150 lb 12.7 oz  Weight (kg) 75.8 kg 69.3 kg 68.4 kg  Some encounter information is confidential and restricted. Go to Review Flowsheets activity to see all data.      Telemetry    Afib 104-120/m - Personally Reviewed   Physical Exam    GEN: No acute distress.   Neck:  No JVD Cardiac: irreg,  2/6 sys murmur LSB, rubs, or gallops.  Respiratory: bibasilar crackles/rales GI: Soft, nontender, non-distended  NI:DPOEU edema; No deformity. Neuro:  Nonfocal  Psych: Normal affect   Labs    High Sensitivity Troponin:   Recent Labs  Lab 03/09/21 1346 03/09/21 1527  TROPONINIHS 7 7     Chemistry Recent Labs  Lab 03/22/21 0938 03/23/21 0401 03/24/21 0406 03/25/21 0442 03/26/21 0431 03/27/21 0356 03/28/21 0737 03/29/21 0430  NA 135 136   < > 135 138 133* 135 138  K 3.8 4.2   < > 3.9 3.8 3.9 3.9 3.5  CL 96* 103   < > 101 101 96* 94* 97*  CO2 27 26   < > 28 31 32 33* 34*  GLUCOSE 250* 240*   < > 244* 192* 204* 235* 168*  BUN 14 14   < > 7* '8 8 9 9  '$ CREATININE 0.75 0.64   < > 0.56 0.62 0.52 0.58 0.50  CALCIUM 8.6* 8.0*   < > 8.2* 8.3* 8.1* 8.1* 8.0*  MG  --   --    < > 1.8  2.0  --   --  2.0  PROT 7.6 6.0*  --   --   --   --   --   --   ALBUMIN 3.4* 2.5*  --   --   --   --   --   --   AST 15 11*  --   --   --   --   --   --   ALT 12 11  --   --   --   --   --   --   ALKPHOS 99 66  --   --   --   --   --   --   BILITOT 0.7 0.9  --   --   --   --   --   --   GFRNONAA >60 >60   < > >60 >60 >60 >60 >60  ANIONGAP 12 7   < > '6 6 5 8 7   '$ < > = values in this interval not displayed.    Lipids No results for input(s): CHOL, TRIG, HDL, LABVLDL, LDLCALC, CHOLHDL in the last 168 hours.  Hematology Recent Labs  Lab 03/26/21 0431 03/27/21 0356 03/29/21 0430  WBC 9.3 12.6* 8.2  RBC 3.32* 3.42* 3.36*  HGB 9.6* 9.9* 9.3*  HCT 30.1* 31.0* 29.7*  MCV 90.7 90.6 88.4  MCH 28.9 28.9 27.7  MCHC 31.9 31.9 31.3  RDW 13.5 13.6 13.2  PLT 330 374 369   Thyroid No results for input(s): TSH, FREET4 in the last 168 hours.  BNP Recent Labs  Lab 03/22/21 0938 03/27/21 0357  BNP 365.0* 276.0*    DDimer No results for input(s): DDIMER in the last 168 hours.   Radiology    DG CHEST PORT 1 VIEW  Result Date: 03/28/2021 CLINICAL DATA:  76 year old female with  history of pneumonia. EXAM: PORTABLE CHEST 1 VIEW COMPARISON:  Chest x-ray 03/26/2021. FINDINGS: There is a right upper extremity PICC with tip terminating in the right atrium. Right persistent opacity throughout the left mid to lower hemithorax. Moderate to large left pleural effusion. No definite right pleural effusion. There is cephalization of the pulmonary vasculature, indistinctness of the interstitial markings, and patchy airspace disease throughout the lungs bilaterally suggestive of moderate pulmonary edema. Mild cardiomegaly. Upper mediastinal contours are distorted by patient rotation to the left. Atherosclerotic calcifications in the thoracic aorta. IMPRESSION: 1. Compared to the prior study, the degree of pulmonary edema appears improved. 2. However, there continues to be extensive airspace consolidation throughout the left mid to lower lung, suggesting pneumonia. 3. Moderate left parapneumonic pleural effusion, unchanged. 4. Aortic atherosclerosis. Electronically Signed   By: Vinnie Langton M.D.   On: 03/28/2021 05:51    Cardiac Studies      Echocardiogram 10/05/2019:  1. Left ventricular ejection fraction, by estimation, is 60 to 65%. The  left ventricle has normal function. The left ventricle has no regional  wall motion abnormalities. There is moderate concentric left ventricular  hypertrophy and severe basal septal  hypertrophy .   2. Right ventricular systolic function is normal. The right ventricular  size is normal. Tricuspid regurgitation signal is inadequate for assessing  PA pressure.   3. Left atrial size was mildly dilated.   4. The mitral valve is normal in structure. Mild mitral valve  regurgitation. No evidence of mitral stenosis. Moderate mitral annular  calcification.   5. The aortic valve is tricuspid. Aortic valve regurgitation is not  visualized. Mild to moderate  aortic valve sclerosis/calcification is  present, without any evidence of aortic stenosis.   6. The  inferior vena cava is normal in size with greater than 50%  respiratory variability, suggesting right atrial pressure of 3 mmHg.     Patient Profile     76 y.o. female  with a hx of PAF who is being seen 03/23/2021 for the evaluation of tachycardia  at the request of Dr Tat.   Assessment & Plan     Pneumonia/Hypoxia/sepsis - per primary team, initially on bipap now weaned to [place], CXR yest am shows ongoing pneumonia      PAF - presented with afib with RVR in setting of pneumonia, hypoxia - historically rate control has been affected by low bp's as outpatient - home regimen dilt 120, toprol 75, eliquis '5mg'$  bid -low bp's on IV diltiazem, changed to IV amiodarone. Started on oral digoxin 0.'125mg'$  daily  - afib should improve as systemic illness resolves and overtime would be able to get back to her home regimen.   - rates remained elevated 110-120.She was Rebolused amio '150mg'$  on Fri and remains on drip at '30mg'$ /hr. Continue oral digoxin. BP's improved, also on oral diltiazem '30mg'$  oral qid with hold parameters. If issues with soft bp's can d/c diltiazem and could IV load digoxin 0.'25mg'$  IV q 6 hrs x 3 doses then resume oral maintence 0.'125mg'$  daily per Dr. Harl Bowie. AMio and dig would be short term medications. overall appears to be tolerating Afib. Continue current meds.  -fluid overload-CXR yest decrease pulm edema mod left pleural effusion-will give 1 dose lasix 20 mg today-BP should tolerate     Orthostatic hypotension - has been on midodrine at home, continued here.           For questions or updates, please contact [place] Please consult [email or web address] for contact info under        Signed, Ermalinda Barrios, PA-C  03/29/2021, 8:21 AM    Personally seen and examined. Agree with APP above with the following comments: Briefly 76 yo F with PAF and complex hypoxic respiratory failure related to aspiration PNA Patient notes that her palpitations have resolved.  Notes that her whole body  aches which she attributes to being in the ICU bed Exam notable for rhonchi bilaterally, she has a systolic murmur in the RUSB and LUSB.  6 cm JVD above the clavicle  Labs notable for K 3.5, Creatinine 0.5 Personally reviewed relevant tests; AF rates, on average, have improved from prior Would recommend  - Continue IV amiodarone, diltiazem and digoxin, goal of average heart rate < 110 bpms; if this continue in 03/30/21, we may consider trial of PO transition - will continue midodrine - given persistent hypoxia, will cautiously attempt low dose diuretic  Rudean Haskell, MD [facility]  [address] [place], [place] [address] [phone]  9:33 AM

## 2021-03-29 NOTE — TOC Progression Note (Signed)
Transition of Care (TOC) - Progression Note  ? ? ?Patient Details  ?Name: [name] ?MRN: [id number] ?Date of Birth: 06-Jun-1945 ? ?Transition of Care (TOC) CM/SW Contact  ?Shade Flood, LCSW ?Phone Number: ?03/29/2021, 10:52 AM ? ?Clinical Narrative:    ? ?TOC following. PT evaluation note indicates pt needed moderate assistance with transfers and ambulation and she was only able to ambulate 4 feet. Bedside RN states pt needed assist of two to get from chair back to bed yesterday. Pt was minimum assistance with ADLs at ALF prior to admission. Reviewed with pt's legal guardian and she approves SNF rehab referrals. CMS provider options reviewed. ? ?Will refer out and update guardian with bed offers when available. Will also start PASRR review. ? ?Expected Discharge Plan: [place] ?Barriers to Discharge: Continued Medical Work up ? ?Expected Discharge Plan and Services ?Expected Discharge Plan: [place] ?In-house Referral: Clinical Social Work ?Discharge Planning Services: CM Consult ?Post Acute Care Choice: [place] ?Living arrangements for the past 2 months: [place] ?                ?  ?  ?  ?  ?  ?  ?  ?  ?  ?  ? ? ?Social Determinants of Health (SDOH) Interventions ?  ? ?Readmission Risk Interventions ?Readmission Risk Prevention Plan 03/29/2021 03/23/2021  ?Transportation Screening Complete Complete  ?[place] or Home Care Consult - Complete  ?Social Work Consult for [place] Planning/Counseling - Complete  ?Palliative Care Screening - Not Applicable  ?Medication Review Press photographer) - Complete  ?[place] or Home Care Consult Complete -  ?SW Recovery Care/Counseling Consult Complete -  ?Palliative Care Screening Not Applicable -  ?Skilled Nursing Facility Complete -  ?Some recent data might be hidden  ? ? ?

## 2021-03-30 DIAGNOSIS — I4891 Unspecified atrial fibrillation: Secondary | ICD-10-CM | POA: Diagnosis not present

## 2021-03-30 DIAGNOSIS — I951 Orthostatic hypotension: Secondary | ICD-10-CM

## 2021-03-30 DIAGNOSIS — A419 Sepsis, unspecified organism: Principal | ICD-10-CM

## 2021-03-30 LAB — GLUCOSE, CAPILLARY
Glucose-Capillary: 172 mg/dL — ABNORMAL HIGH (ref 70–99)
Glucose-Capillary: 178 mg/dL — ABNORMAL HIGH (ref 70–99)
Glucose-Capillary: 228 mg/dL — ABNORMAL HIGH (ref 70–99)
Glucose-Capillary: 100 mg/dL — ABNORMAL HIGH (ref 70–99)
Glucose-Capillary: 197 mg/dL — ABNORMAL HIGH (ref 70–99)

## 2021-03-30 MED ORDER — AMIODARONE HCL 200 MG PO TABS
200.0000 mg | ORAL_TABLET | Freq: Two times a day (BID) | ORAL | Status: DC
  Administered 2021-03-30 – 2021-04-01 (×5): 200 mg via ORAL
  Filled 2021-03-30 (×5): qty 1

## 2021-03-30 MED ORDER — METOPROLOL TARTRATE 25 MG PO TABS
12.5000 mg | ORAL_TABLET | Freq: Three times a day (TID) | ORAL | Status: DC
  Administered 2021-03-30 – 2021-04-01 (×7): 12.5 mg via ORAL
  Filled 2021-03-30 (×7): qty 1

## 2021-03-30 NOTE — TOC Progression Note (Signed)
Transition of Care (TOC) - Progression Note  ? ? ?Patient Details  ?Name: [name] ?MRN: [id number] ?Date of Birth: [birth year] ? ?Transition of Care (TOC) CM/SW Contact  ?Salome Arnt, LCSW ?Phone Number: ?03/30/2021, 8:53 AM ? ?Clinical Narrative:  30 day PASRR received. LCSW spoke with pt's guardian, Almyra Free to discuss bed offers. She selects [facility]. Facility notified. Anticipate d/c tomorrow per MD.    ? ? ? ?Expected Discharge Plan: [place] ?Barriers to Discharge: Continued Medical Work up ? ?Expected Discharge Plan and Services ?Expected Discharge Plan: [place] ?In-house Referral: Clinical Social Work ?Discharge Planning Services: CM Consult ?Post Acute Care Choice: [place] ?Living arrangements for the past 2 months: [place] ?                ?  ?  ?  ?  ?  ?  ?  ?  ?  ?  ? ? ?Social Determinants of Health (SDOH) Interventions ?  ? ?Readmission Risk Interventions ?Readmission Risk Prevention Plan 03/29/2021 03/23/2021  ?Transportation Screening Complete Complete  ?[place] or Home Care Consult - Complete  ?Social Work Consult for [place] Planning/Counseling - Complete  ?Palliative Care Screening - Not Applicable  ?Medication Review Press photographer) - Complete  ?[place] or Home Care Consult Complete -  ?SW Recovery Care/Counseling Consult Complete -  ?Palliative Care Screening Not Applicable -  ?Skilled Nursing Facility Complete -  ?Some recent data might be hidden  ? ? ?

## 2021-03-30 NOTE — Assessment & Plan Note (Signed)
Venous duplex negative for DVT. 

## 2021-03-30 NOTE — Progress Notes (Signed)
PROGRESS NOTE  [name]:[id number] DOB: [birth year] DOA: 03/22/2021 PCP: Carrolyn Meiers, MD  Brief History:  76 year old female with a history of paroxysmal atrial fibrillation on apixaban, hypertension, orthostatic hypotension, anxiety/depression, diabetes mellitus type 2, hyperlipidemia, CVA, chronic pain, schizophrenia, GERD presenting with 2-day history of coughing and shortness of breath from high [place].  History is limited secondary to patient being in extremis.  Upon EMS arrival, the patient was noted to have oxygen saturation 80% on room air.  She was noted to have atrial fibrillation with RVR.  The patient denies any chest pain but complains of shortness of breath and coughing.  There is no history of nausea, vomiting, diarrhea, domino pain.  In the ED, the patient had a fever up to 101.7 F with tachycardia initially in the 170s.  She was hemodynamically stable.  She was placed on BiPAP.  She was started on vancomycin, cefepime, metronidazole.  Chest x-ray showed bilateral interstitial and airspace opacities.  Lactic acid was 2.5 >>2.1.  Sodium 135, potassium 3.8, bicarbonate 27, serum creatinine 0.75.  LFTs were unremarkable.  WBC 14.0, hemoglobin 12.0, platelets 295,000.  03/23/2021:  pt taken off bipap in AM, on nasal cannula.  Cardiology stopped IV diltiazem and lopressor and started IV amiodarone.    03/24/2021: Afib RVR persists overnight, remains on IV amiodarone.  Cardio added digoxin.  Still with dyspnea symptoms and palpitations.    03/25/2021: Pt reports that she still has shortness of breath and palpitations and her anxiety is out of control. She remains on IV amiodarone. Additional IV bolus ordered by Dr. Harl Bowie.   03/26/2021: remains in afibRVR, on IV amio, oral diltiazem, oral digoxin, HR slightly better.  Complains of weakness, but no CP or SOB, c/o feeling anxious.   03/27/2021: Afib still difficult to control. PICC line placed 3/4. Brief time patient was  off IV amiod on 3/4 due to no IV access.  BP tolerating oral diltiazem.  Pt remains very anxious.  She reports palpitations.  She denies cough, chest pain and SOB. Plan to get up to chair today.    03/28/2021: Breathing much improved.  Back to baseline oxygen requirements.  Afib RVR persists on IV amiodarone, cardizem, digoxin.  Planning PT eval when heart rate better controlled for better idea of disposition.    03/29/2021: HR starting to improve. Completed full course of antibiotics for pneumonia.  Working on ambulating and working on discharge placement to rehab.  Cardiology says could possibly transition to oral agents 3/8.   03/30/21:  d/c digoxin.  Continue po diltiazem.  IV>>po amio.  PO metoprolol added.  HRs better controlled    Assessment and Plan: * Acute respiratory failure with hypoxia (HCC) Secondary to pneumonia and afib RVR Initially required BiPAP Off BiPAP--she has been weaned back to RA  Edema of right forearm Venous duplex negative for DVT  Paroxysmal atrial fibrillation (HCC) Currently in RVR cardiology team added oral diltiazem 3/3 Remains on IV amiodarone and oral cardizem Oral metoprolol added, IV>>po amio   Sepsis due to undetermined organism ([place]) SEPSIS PHYSIOLOGY RESOLVED NOW Present on admission Due to pneumonia Presented with fever, leukocytosis and respiratory failure - all treated and resolved Lactic 2.5>>2.1 Completed IVF Completed course of IV abx 03/28/21 Blood culture no growth to date.    Lobar pneumonia ([place]) Treated with ceftriaxone/azithromycin for full 7 days MRSA screen negative  Recommend patient get PA/Lat CXR in 4-6 weeks to document resolution.  Atrial fibrillation with RVR (HCC) IV amiodarone per cardiology starting 03/23/21, dig added 3/2 Remains uncontrolled.  Additional IV bolus ordered on 3/3 by Dr. Harl Bowie Digoxin per cardiology.   ( brief period off IV amio on 3/4 due to lost IV access) PICC line now in place Remains on IV  amiodarone and oral cardizem Oral metoprolol added, IV>>po amio on 3/8  Schizophrenia (HCC) Continue Abilify, Risperdal, Remeron, Cymbalta  Orthostasis Continue home midodrine  Mixed hyperlipidemia Continue statin  Essential hypertension, benign Now back on oral cardizem per cardiology service.  BP tolerating oral cardizem so far.   Po metoprolol added 3/8  Uncontrolled type 2 diabetes mellitus with hyperglycemia, with long-term current use of insulin (HCC) Continue Semglee 24 units plus novolog 8 unit TIDAC, plus SSI, CBG 5 times per day NovoLog sliding scale hemoglobin A1c 7.1%.    CBG (last 3)  Recent Labs    03/29/21 0741 03/29/21 1124 03/29/21 1558  GLUCAP 165* 222* 280*            Status is: Inpatient Remains inpatient appropriate because: severity of illness requiring BiPAP and IV chronotropic meds    Family Communication: no  Family at bedside  Consultants:  cardiology  Code Status:  FULL   DVT Prophylaxis:  apixaban   Procedures: As Listed in Progress Note Above  Antibiotics: Finished 7 days abx       Subjective: Patient denies fevers, chills, headache, chest pain, dyspnea, nausea, vomiting, diarrhea, abdominal pain, dysuria, hematuria, hematochezia, and melena.    Objective: Vitals:   03/30/21 1400 03/30/21 1500 03/30/21 1600 03/30/21 1609  BP: (!) 115/54 (!) 125/51 (!) 110/41   Pulse: 96 (!) 115 (!) 106 97  Resp: 20 (!) '21 19 18  '$ Temp:    97.9 F (36.6 C)  TempSrc:    Oral  SpO2: 95% (!) 88% 93% 90%  Weight:      Height:        Intake/Output Summary (Last 24 hours) at 03/30/2021 1811 Last data filed at 03/30/2021 1458 Gross per 24 hour  Intake 1319.64 ml  Output 3050 ml  Net -1730.36 ml   Weight change:  Exam:  General:  Pt is alert, follows commands appropriately, not in acute distress HEENT: No icterus, No thrush, No neck mass, [place]/AT Cardiovascular: IRRR, S1/S2, no rubs, no gallops Respiratory: bibasilar rales.   No wheeze Abdomen: Soft/+BS, non tender, non distended, no guarding Extremities: No edema, No lymphangitis, No petechiae, No rashes, no synovitis   Data Reviewed: I have personally reviewed following labs and imaging studies Basic Metabolic Panel: Recent Labs  Lab 03/24/21 0406 03/25/21 0442 03/26/21 0431 03/27/21 0356 03/28/21 0737 03/29/21 0430  NA 135 135 138 133* 135 138  K 4.0 3.9 3.8 3.9 3.9 3.5  CL 103 101 101 96* 94* 97*  CO2 '25 28 31 '$ 32 33* 34*  GLUCOSE 218* 244* 192* 204* 235* 168*  BUN 10 7* '8 8 9 9  '$ CREATININE 0.59 0.56 0.62 0.52 0.58 0.50  CALCIUM 8.0* 8.2* 8.3* 8.1* 8.1* 8.0*  MG 1.8 1.8 2.0  --   --  2.0   Liver Function Tests: No results for input(s): AST, ALT, ALKPHOS, BILITOT, PROT, ALBUMIN in the last 168 hours. No results for input(s): LIPASE, AMYLASE in the last 168 hours. No results for input(s): AMMONIA in the last 168 hours. Coagulation Profile: No results for input(s): INR, PROTIME in the last 168 hours. CBC: Recent Labs  Lab 03/24/21 0406 03/25/21 0442 03/26/21 0431 03/27/21  8413 03/29/21 0430  WBC 11.7* 9.8 9.3 12.6* 8.2  NEUTROABS 9.7* 7.9* 6.3 9.9* 6.1  HGB 9.3* 9.9* 9.6* 9.9* 9.3*  HCT 30.7* 31.7* 30.1* 31.0* 29.7*  MCV 91.9 89.8 90.7 90.6 88.4  PLT 277 295 330 374 369   Cardiac Enzymes: No results for input(s): CKTOTAL, CKMB, CKMBINDEX, TROPONINI in the last 168 hours. BNP: Invalid input(s): POCBNP CBG: Recent Labs  Lab 03/29/21 2127 03/30/21 0314 03/30/21 0722 03/30/21 1109 03/30/21 1700  GLUCAP 199* 172* 178* 228* 100*   HbA1C: No results for input(s): HGBA1C in the last 72 hours. Urine analysis:    Component Value Date/Time   COLORURINE YELLOW 03/23/2021 [place] 03/23/2021 1835   APPEARANCEUR Cloudy (A) 05/01/2016 1525   LABSPEC 1.025 03/23/2021 1835   PHURINE 5.0 03/23/2021 1835   GLUCOSEU NEGATIVE 03/23/2021 1835   HGBUR NEGATIVE 03/23/2021 1835   BILIRUBINUR NEGATIVE 03/23/2021 1835    BILIRUBINUR Negative 05/01/2016 1525   KETONESUR 5 (A) 03/23/2021 1835   PROTEINUR NEGATIVE 03/23/2021 1835   UROBILINOGEN 0.2 09/14/2014 1703   NITRITE NEGATIVE 03/23/2021 1835   LEUKOCYTESUR NEGATIVE 03/23/2021 1835   Sepsis Labs: '@LABRCNTIP'$ (procalcitonin:4,lacticidven:4) ) Recent Results (from the past 240 hour(s))  Resp Panel by RT-PCR (Flu A&B, Covid) Nasopharyngeal Swab     Status: None   Collection Time: 03/22/21  9:07 AM   Specimen: Nasopharyngeal Swab; Nasopharyngeal(NP) swabs in vial transport medium  Result Value Ref Range Status   SARS Coronavirus 2 by RT PCR NEGATIVE NEGATIVE Final    Comment: (NOTE) SARS-CoV-2 target nucleic acids are NOT DETECTED.  The SARS-CoV-2 RNA is generally detectable in upper respiratory specimens during the acute phase of infection. The lowest concentration of SARS-CoV-2 viral copies this assay can detect is 138 copies/mL. A negative result does not preclude SARS-Cov-2 infection and should not be used as the sole basis for treatment or other patient management decisions. A negative result may occur with  improper specimen collection/handling, submission of specimen other than nasopharyngeal swab, presence of viral mutation(s) within the areas targeted by this assay, and inadequate number of viral copies(<138 copies/mL). A negative result must be combined with clinical observations, patient history, and epidemiological information. The expected result is Negative.  Fact Sheet for Patients:  EntrepreneurPulse.com.au  Fact Sheet for Healthcare Providers:  IncredibleEmployment.be  This test is no t yet approved or cleared by the [place] FDA and  has been authorized for detection and/or diagnosis of SARS-CoV-2 by FDA under an Emergency Use Authorization (EUA). This EUA will remain  in effect (meaning this test can be used) for the duration of the COVID-19 declaration under Section 564(b)(1) of the  Act, 21 U.S.C.section 360bbb-3(b)(1), unless the authorization is terminated  or revoked sooner.       Influenza A by PCR NEGATIVE NEGATIVE Final   Influenza B by PCR NEGATIVE NEGATIVE Final    Comment: (NOTE) The Xpert Xpress SARS-CoV-2/FLU/RSV plus assay is intended as an aid in the diagnosis of influenza from Nasopharyngeal swab specimens and should not be used as a sole basis for treatment. Nasal washings and aspirates are unacceptable for Xpert Xpress SARS-CoV-2/FLU/RSV testing.  Fact Sheet for Patients: EntrepreneurPulse.com.au  Fact Sheet for Healthcare Providers: IncredibleEmployment.be  This test is not yet approved or cleared by the [place] FDA and has been authorized for detection and/or diagnosis of SARS-CoV-2 by FDA under an Emergency Use Authorization (EUA). This EUA will remain in effect (meaning this test can be used) for the duration of  the COVID-19 declaration under Section 564(b)(1) of the Act, 21 U.S.C. section 360bbb-3(b)(1), unless the authorization is terminated or revoked.  Performed at [facility], [address], [place], [place] [address]   Blood Culture (routine x 2)     Status: None   Collection Time: 03/22/21  9:19 AM   Specimen: BLOOD  Result Value Ref Range Status   Specimen Description BLOOD BLOOD RIGHT HAND  Final   Special Requests   Final    BOTTLES DRAWN AEROBIC AND ANAEROBIC Blood Culture results may not be optimal due to an inadequate volume of blood received in culture bottles   Culture   Final    NO GROWTH 6 DAYS Performed at [facility], [address], [place], [place] [address]    Report Status 03/28/2021 FINAL  Final  Blood Culture (routine x 2)     Status: None   Collection Time: 03/22/21  9:38 AM   Specimen: BLOOD  Result Value Ref Range Status   Specimen Description BLOOD LEFT ANTECUBITAL  Final   Special Requests   Final    Blood Culture adequate volume BOTTLES DRAWN AEROBIC AND  ANAEROBIC   Culture   Final    NO GROWTH 6 DAYS Performed at [facility], [address] [place], [place] [address]    Report Status 03/28/2021 FINAL  Final  MRSA Next Gen by PCR, Nasal     Status: None   Collection Time: 03/22/21  9:24 PM   Specimen: Nasal Mucosa; Nasal Swab  Result Value Ref Range Status   MRSA by PCR Next Gen NOT DETECTED NOT DETECTED Final    Comment: (NOTE) The GeneXpert MRSA Assay (FDA approved for NASAL specimens only), is one component of a comprehensive MRSA colonization surveillance program. It is not intended to diagnose MRSA infection nor to guide or monitor treatment for MRSA infections. Test performance is not FDA approved in patients less than 75 years old. Performed at [facility], [address], [place], [place] [address]      Scheduled Meds:  amiodarone  200 mg Oral BID   apixaban  5 mg Oral BID   ARIPiprazole  2 mg Oral Daily   atorvastatin  40 mg Oral Daily   Chlorhexidine Gluconate Cloth  6 each Topical Daily   diltiazem  30 mg Oral QID   DULoxetine  120 mg Oral Daily   gabapentin  300 mg Oral TID   guaiFENesin  600 mg Oral BID   insulin aspart  0-9 Units Subcutaneous TID WC   insulin aspart  8 Units Subcutaneous TID WC   insulin glargine-yfgn  24 Units Subcutaneous QHS   ipratropium-albuterol  3 mL Nebulization BID   metoprolol tartrate  12.5 mg Oral Q8H   midodrine  10 mg Oral TID   mirabegron ER  25 mg Oral Daily   mirtazapine  30 mg Oral QHS   pantoprazole  40 mg Oral BID AC   risperiDONE  1 mg Oral BID   Continuous Infusions:  Procedures/Studies: DG Chest 1 View  Result Date: 03/09/2021 CLINICAL DATA:  Weakness, pain EXAM: CHEST  1 VIEW COMPARISON:  Bibasilar atelectasis, 02/20/2020 FINDINGS: Enlargement of cardiac silhouette. Mediastinal contours and pulmonary vascularity normal. Atherosclerotic calcification aorta. Bibasilar atelectasis. No definite infiltrate, pleural effusion, or pneumothorax. Bones demineralized.  IMPRESSION: Enlargement of cardiac silhouette with bibasilar atelectasis. Aortic Atherosclerosis (ICD10-I70.0). Electronically Signed   By: Lavonia Dana M.D.   On: 03/09/2021 14:41   CT Head Wo Contrast  Result Date:  03/09/2021 CLINICAL DATA:  Headache and weakness. EXAM: CT HEAD WITHOUT CONTRAST TECHNIQUE: Contiguous axial images were obtained from the base of the skull through the vertex without intravenous contrast. RADIATION DOSE REDUCTION: This exam was performed according to the departmental dose-optimization program which includes automated exposure control, adjustment of the mA and/or kV according to patient size and/or use of iterative reconstruction technique. COMPARISON:  MRI brain and CT head dated October 06, 2016. FINDINGS: Brain: No evidence of acute infarction, hemorrhage, hydrocephalus, extra-axial collection or mass lesion/mass effect. Stable atrophy and chronic microvascular ischemic changes. Vascular: Calcified atherosclerosis at the skull base. No hyperdense vessel. Skull: Negative for fracture or focal lesion. Sinuses/Orbits: No acute finding.  Prior endoscopic sinus surgery. Other: None. IMPRESSION: 1. No acute intracranial abnormality. Stable atrophy and chronic microvascular ischemic changes. Electronically Signed   By: Titus Dubin M.D.   On: 03/09/2021 14:19   US Venous Img Upper Uni Right(DVT)  Result Date: 03/29/2021 CLINICAL DATA:  Right forearm edema. History of right upper extremity approach PICC line placement. Evaluate for DVT. EXAM: RIGHT UPPER EXTREMITY VENOUS DOPPLER ULTRASOUND TECHNIQUE: Gray-scale sonography with graded compression, as well as color Doppler and duplex ultrasound were performed to evaluate the upper extremity deep venous system from the level of the subclavian vein and including the jugular, axillary, basilic, radial, ulnar and upper cephalic vein. Spectral Doppler was utilized to evaluate flow at rest and with distal augmentation maneuvers.  COMPARISON:  Chest radiograph-03/28/2021 FINDINGS: Contralateral Subclavian Vein: Respiratory phasicity is normal and symmetric with the symptomatic side. No evidence of thrombus. Normal compressibility. Internal Jugular Vein: No evidence of thrombus. Normal compressibility, respiratory phasicity and response to augmentation. Subclavian Vein: No evidence of thrombus. Normal compressibility, respiratory phasicity and response to augmentation. Axillary Vein: No evidence of thrombus. Normal compressibility, respiratory phasicity and response to augmentation. Cephalic Vein: No evidence of thrombus. Normal compressibility, respiratory phasicity and response to augmentation. Basilic Vein: No evidence of thrombus. Normal compressibility, respiratory phasicity and response to augmentation. Brachial Veins: No evidence of thrombus. Normal compressibility, respiratory phasicity and response to augmentation. Radial Veins: No evidence of thrombus. Normal compressibility, respiratory phasicity and response to augmentation. Ulnar Veins: No evidence of thrombus. Normal compressibility, respiratory phasicity and response to augmentation. Venous Reflux:  None visualized. Other Findings:  None visualized. IMPRESSION: No evidence of DVT within the right upper extremity. Electronically Signed   By: Sandi Mariscal M.D.   On: 03/29/2021 12:59   DG CHEST PORT 1 VIEW  Result Date: 03/28/2021 CLINICAL DATA:  76 year old female with history of pneumonia. EXAM: PORTABLE CHEST 1 VIEW COMPARISON:  Chest x-ray 03/26/2021. FINDINGS: There is a right upper extremity PICC with tip terminating in the right atrium. Right persistent opacity throughout the left mid to lower hemithorax. Moderate to large left pleural effusion. No definite right pleural effusion. There is cephalization of the pulmonary vasculature, indistinctness of the interstitial markings, and patchy airspace disease throughout the lungs bilaterally suggestive of moderate pulmonary  edema. Mild cardiomegaly. Upper mediastinal contours are distorted by patient rotation to the left. Atherosclerotic calcifications in the thoracic aorta. IMPRESSION: 1. Compared to the prior study, the degree of pulmonary edema appears improved. 2. However, there continues to be extensive airspace consolidation throughout the left mid to lower lung, suggesting pneumonia. 3. Moderate left parapneumonic pleural effusion, unchanged. 4. Aortic atherosclerosis. Electronically Signed   By: Vinnie Langton M.D.   On: 03/28/2021 05:51   DG CHEST PORT 1 VIEW  Result Date: 03/26/2021 CLINICAL DATA:  Shortness  of breath; PICC line placement EXAM: PORTABLE CHEST 1 VIEW COMPARISON:  March 14, 2021 FINDINGS: The cardiomediastinal silhouette is unchanged and enlarged in contour.RIGHT upper extremity PICC tip terminates over the superior RIGHT atrium. Small LEFT pleural effusion. Likely trace RIGHT pleural effusion. No pneumothorax. Increased bilateral airspace opacities. Increased LEFT retrocardiac opacification. Visualized abdomen is unremarkable. Multilevel degenerative changes of the thoracic spine. IMPRESSION: 1. RIGHT upper extremity PICC tip terminates over the superior RIGHT atrium. 2. Increased patchy bilateral airspace opacities as well as increased LEFT retrocardiac consolidation. Differential considerations include pulmonary edema with scattered areas of atelectasis versus multifocal infection. Electronically Signed   By: Valentino Saxon M.D.   On: 03/26/2021 17:08   DG Chest Port 1 View  Result Date: 03/22/2021 CLINICAL DATA:  Questionable sepsis. EXAM: PORTABLE CHEST 1 VIEW COMPARISON:  03/09/2021 FINDINGS: Patchy interstitial and alveolar airspace opacities throughout bilateral lungs concerning for multilobar pneumonia including atypical viral pneumonia. No pleural effusion or pneumothorax. Stable cardiomegaly. No acute osseous abnormality. IMPRESSION: 1. Patchy interstitial and alveolar airspace  opacities throughout bilateral lungs concerning for multilobar pneumonia including atypical viral pneumonia. Electronically Signed   By: Kathreen Devoid M.D.   On: 03/22/2021 09:29   DG Knee Complete 4 Views Right  Result Date: 03/09/2021 CLINICAL DATA:  RIGHT leg pain and weakness EXAM: RIGHT KNEE - COMPLETE 4+ VIEW COMPARISON:  02/28/2017 FINDINGS: Osseous demineralization. Tricompartmental joint space narrowing and spur formation. Chondrocalcinosis question CPPD. No acute fracture, dislocation, or bone destruction. No joint effusion. IMPRESSION: Osseous demineralization with degenerative changes and probable CPPD RIGHT knee. No acute osseous abnormalities. Electronically Signed   By: Lavonia Dana M.D.   On: 03/09/2021 14:43   DG HIPS BILAT WITH PELVIS 3-4 VIEWS  Result Date: 03/09/2021 CLINICAL DATA:  Weakness, pain EXAM: DG HIP (WITH OR WITHOUT PELVIS) 3-4V BILAT COMPARISON:  10/06/2016 FINDINGS: Osseous demineralization. Hip and SI joint spaces preserved. No fracture, dislocation, or bone destruction. IMPRESSION: Osseous demineralization. No acute abnormalities. Electronically Signed   By: Lavonia Dana M.D.   On: 03/09/2021 14:42   [place] EKG SITE RITE  Result Date: 03/26/2021 If Site Rite image not attached, placement could not be confirmed due to current cardiac rhythm.   Orson Eva, DO  [facility]  If 7PM-7AM, please contact night-coverage [email or web address] Password [email or web address] 03/30/2021, 6:11 PM   LOS: 8 days

## 2021-03-30 NOTE — Progress Notes (Signed)
? ?Progress Note ? ?Patient Name: [name] ?Date of Encounter: 03/30/2021 ? ?Primary Cardiologist: Rozann Lesches, MD ? ?Subjective  ? ?No sense of palpitations. Epigastric discomfort. ? ?Inpatient Medications  ?  ?Scheduled Meds: ? apixaban  5 mg Oral BID  ? ARIPiprazole  2 mg Oral Daily  ? atorvastatin  40 mg Oral Daily  ? Chlorhexidine Gluconate Cloth  6 each Topical Daily  ? digoxin  0.125 mg Oral Daily  ? diltiazem  30 mg Oral QID  ? DULoxetine  120 mg Oral Daily  ? gabapentin  300 mg Oral TID  ? guaiFENesin  600 mg Oral BID  ? insulin aspart  0-9 Units Subcutaneous TID WC  ? insulin aspart  8 Units Subcutaneous TID WC  ? insulin glargine-yfgn  24 Units Subcutaneous QHS  ? ipratropium-albuterol  3 mL Nebulization BID  ? midodrine  10 mg Oral TID  ? mirabegron ER  25 mg Oral Daily  ? mirtazapine  30 mg Oral QHS  ? pantoprazole  40 mg Oral BID AC  ? risperiDONE  1 mg Oral BID  ? ?Continuous Infusions: ? amiodarone 30 mg/hr (03/29/21 1052)  ? ?PRN Meds: ?acetaminophen **OR** acetaminophen, guaiFENesin-dextromethorphan, HYDROcodone-acetaminophen, hydrOXYzine, LORazepam, Muscle Rub, ondansetron **OR** ondansetron (ZOFRAN) IV  ? ?Vital Signs  ?  ?Vitals:  ? 03/30/21 0600 03/30/21 0730 03/30/21 0753 03/30/21 0800  ?BP: 108/65   134/65  ?Pulse: 94  (!) 122 (!) 148  ?Resp: (!) 21  19 (!) 21  ?Temp:  98.6 ?F (37 ?C)    ?TempSrc:  Oral    ?SpO2: 100%  100% 100%  ?Weight:      ?Height:      ? ? ?Intake/Output Summary (Last 24 hours) at 03/30/2021 0847 ?Last data filed at 03/30/2021 0730 ?Gross per 24 hour  ?Intake 440 ml  ?Output 2700 ml  ?Net -2260 ml  ? ?Filed Weights  ? 03/24/21 0500 03/25/21 0600 03/28/21 0404  ?Weight: 68.4 kg 69.3 kg 75.8 kg  ? ? ?Telemetry  ?  ?Atrial fibrillation at 110-120 bpm.  Personally reviewed. ? ?ECG  ?  ?No ECG reviewed. ? ?Physical Exam  ? ?GEN: No acute distress.   ?Neck: No JVD. ?Cardiac: Irregularly irregular, 2/6 systolic murmur, no gallop.  ?Respiratory: Nonlabored.  Scattered  rhonchi anteriorly, no wheezing. ?GI: Soft, nontender, bowel sounds present. ?MS: No edema. ? ?Labs  ?  ?Chemistry ?Recent Labs  ?Lab 03/27/21 ?0356 03/28/21 ?0737 03/29/21 ?0430  ?NA 133* 135 138  ?K 3.9 3.9 3.5  ?CL 96* 94* 97*  ?CO2 32 33* 34*  ?GLUCOSE 204* 235* 168*  ?BUN '8 9 9  '$ ?CREATININE 0.52 0.58 0.50  ?CALCIUM 8.1* 8.1* 8.0*  ?GFRNONAA >60 >60 >60  ?ANIONGAP '5 8 7  '$ ?  ? ?Hematology ?Recent Labs  ?Lab 03/26/21 ?0431 03/27/21 ?0356 03/29/21 ?0430  ?WBC 9.3 12.6* 8.2  ?RBC 3.32* 3.42* 3.36*  ?HGB 9.6* 9.9* 9.3*  ?HCT 30.1* 31.0* 29.7*  ?MCV 90.7 90.6 88.4  ?MCH 28.9 28.9 27.7  ?MCHC 31.9 31.9 31.3  ?RDW 13.5 13.6 13.2  ?PLT 330 374 369  ? ? ?Cardiac Enzymes ?Recent Labs  ?Lab 03/09/21 ?1346 03/09/21 ?1527  ?TROPONINIHS 7 7  ? ? ?BNP ?Recent Labs  ?Lab 03/27/21 ?9518  ?BNP 276.0*  ?  ? ?Radiology  ?  ?US Venous Img Upper Uni Right(DVT) ? ?Result Date: 03/29/2021 ?CLINICAL DATA:  Right forearm edema. History of right upper extremity approach PICC line placement. Evaluate for DVT. EXAM:  RIGHT UPPER EXTREMITY VENOUS DOPPLER ULTRASOUND TECHNIQUE: Gray-scale sonography with graded compression, as well as color Doppler and duplex ultrasound were performed to evaluate the upper extremity deep venous system from the level of the subclavian vein and including the jugular, axillary, basilic, radial, ulnar and upper cephalic vein. Spectral Doppler was utilized to evaluate flow at rest and with distal augmentation maneuvers. COMPARISON:  Chest radiograph-03/28/2021 FINDINGS: Contralateral Subclavian Vein: Respiratory phasicity is normal and symmetric with the symptomatic side. No evidence of thrombus. Normal compressibility. Internal Jugular Vein: No evidence of thrombus. Normal compressibility, respiratory phasicity and response to augmentation. Subclavian Vein: No evidence of thrombus. Normal compressibility, respiratory phasicity and response to augmentation. Axillary Vein: No evidence of thrombus. Normal  compressibility, respiratory phasicity and response to augmentation. Cephalic Vein: No evidence of thrombus. Normal compressibility, respiratory phasicity and response to augmentation. Basilic Vein: No evidence of thrombus. Normal compressibility, respiratory phasicity and response to augmentation. Brachial Veins: No evidence of thrombus. Normal compressibility, respiratory phasicity and response to augmentation. Radial Veins: No evidence of thrombus. Normal compressibility, respiratory phasicity and response to augmentation. Ulnar Veins: No evidence of thrombus. Normal compressibility, respiratory phasicity and response to augmentation. Venous Reflux:  None visualized. Other Findings:  None visualized. IMPRESSION: No evidence of DVT within the right upper extremity. Electronically Signed   By: Sandi Mariscal M.D.   On: 03/29/2021 12:59   ? ? ?Assessment & Plan  ?  ?1.  Persistent atrial fibrillation with RVR with CHA2DS2-VASc score of 6.  Rate control complicated by pneumonia with hypoxic respiratory failure.  Also orthostatic hypotension.  Currently on IV amiodarone, Cardizem 30 mg 4 times daily, and Lanoxin 0.125 mg daily.  She is on Eliquis for stroke prophylaxis. ? ?2.  Known orthostatic hypotension, on midodrine. ? ?3.  Moderate left parapneumonic effusion, pulmonary edema improved by last chest x-ray.  Has been given diuretic dosing intermittently.  Approximately 1700 cc net output last 24 hours. ? ?I reviewed the chart.  Recommend stopping digoxin at this point.  Continue IV amiodarone for now as well as current dose of oral Cardizem.  Plan to start oral Lopressor and uptitrate.  She previously tolerated combination of Cardizem CD and Toprol-XL as an outpatient, I suspect this will be more effective than digoxin presuming her blood pressure tolerates. ? ?Signed, ?Rozann Lesches, MD  ?03/30/2021, 8:47 AM    ?

## 2021-03-31 ENCOUNTER — Inpatient Hospital Stay (HOSPITAL_COMMUNITY): Payer: Medicare Other

## 2021-03-31 DIAGNOSIS — I4891 Unspecified atrial fibrillation: Secondary | ICD-10-CM | POA: Diagnosis not present

## 2021-03-31 LAB — BASIC METABOLIC PANEL
Anion gap: 7 (ref 5–15)
BUN: 7 mg/dL — ABNORMAL LOW (ref 8–23)
CO2: 34 mmol/L — ABNORMAL HIGH (ref 22–32)
Calcium: 8.1 mg/dL — ABNORMAL LOW (ref 8.9–10.3)
Chloride: 97 mmol/L — ABNORMAL LOW (ref 98–111)
Creatinine, Ser: 0.58 mg/dL (ref 0.44–1.00)
GFR, Estimated: >60 mL/min (ref >60)
Glucose, Bld: 127 mg/dL — ABNORMAL HIGH (ref 70–99)
Potassium: 3.8 mmol/L (ref 3.5–5.1)
Sodium: 138 mmol/L (ref 135–145)

## 2021-03-31 LAB — GLUCOSE, CAPILLARY
Glucose-Capillary: 136 mg/dL — ABNORMAL HIGH (ref 70–99)
Glucose-Capillary: 158 mg/dL — ABNORMAL HIGH (ref 70–99)
Glucose-Capillary: 146 mg/dL — ABNORMAL HIGH (ref 70–99)
Glucose-Capillary: 217 mg/dL — ABNORMAL HIGH (ref 70–99)

## 2021-03-31 LAB — CBC
HCT: 30.3 % — ABNORMAL LOW (ref 36.0–46.0)
Hemoglobin: 9.4 g/dL — ABNORMAL LOW (ref 12.0–15.0)
MCH: 28.2 pg (ref 26.0–34.0)
MCHC: 31 g/dL (ref 30.0–36.0)
MCV: 91 fL (ref 80.0–100.0)
Platelets: 402 10*3/uL — ABNORMAL HIGH (ref 150–400)
RBC: 3.33 MIL/uL — ABNORMAL LOW (ref 3.87–5.11)
RDW: 13.1 % (ref 11.5–15.5)
WBC: 7.4 10*3/uL (ref 4.0–10.5)
nRBC: 0 % (ref 0.0–0.2)

## 2021-03-31 LAB — MAGNESIUM: Magnesium: 1.9 mg/dL (ref 1.7–2.4)

## 2021-03-31 LAB — PROCALCITONIN: Procalcitonin: 0.12 ng/mL

## 2021-03-31 MED ORDER — COVID-19MRNA BIVAL VAC MODERNA 50 MCG/0.5ML IM SUSP
0.5000 mL | Freq: Once | INTRAMUSCULAR | Status: AC
  Administered 2021-03-31: 15:00:00 0.5 mL via INTRAMUSCULAR
  Filled 2021-03-31: qty 0.5

## 2021-03-31 MED ORDER — FUROSEMIDE 10 MG/ML IJ SOLN
40.0000 mg | Freq: Once | INTRAMUSCULAR | Status: AC
  Administered 2021-03-31: 10:00:00 40 mg via INTRAVENOUS
  Filled 2021-03-31: qty 4

## 2021-03-31 MED ORDER — MUSCLE RUB 10-15 % EX CREA
TOPICAL_CREAM | Freq: Two times a day (BID) | CUTANEOUS | Status: DC | PRN
  Filled 2021-03-31: qty 85

## 2021-03-31 MED ORDER — DILTIAZEM HCL ER COATED BEADS 120 MG PO CP24
120.0000 mg | ORAL_CAPSULE | Freq: Every day | ORAL | Status: DC
  Administered 2021-03-31 – 2021-04-01 (×2): 120 mg via ORAL
  Filled 2021-03-31 (×2): qty 1

## 2021-03-31 NOTE — Progress Notes (Signed)
Progress Note  Patient Name: [name] Date of Encounter: 03/31/2021  [facility] Cardiologist: Rozann Lesches, MD   Subjective   No chest pain or palpitations. More short of breath this AM and IV Lasix '40mg'$  has been ordered by the admitting team. Repeat CXR shows mild pulmonary edema with a small left pleural effusion versus consolidation.  Inpatient Medications    Scheduled Meds:  amiodarone  200 mg Oral BID   apixaban  5 mg Oral BID   ARIPiprazole  2 mg Oral Daily   atorvastatin  40 mg Oral Daily   Chlorhexidine Gluconate Cloth  6 each Topical Daily   diltiazem  30 mg Oral QID   DULoxetine  120 mg Oral Daily   furosemide  40 mg Intravenous Once   gabapentin  300 mg Oral TID   guaiFENesin  600 mg Oral BID   insulin aspart  0-9 Units Subcutaneous TID WC   insulin aspart  8 Units Subcutaneous TID WC   insulin glargine-yfgn  24 Units Subcutaneous QHS   ipratropium-albuterol  3 mL Nebulization BID   metoprolol tartrate  12.5 mg Oral Q8H   midodrine  10 mg Oral TID   mirabegron ER  25 mg Oral Daily   mirtazapine  30 mg Oral QHS   pantoprazole  40 mg Oral BID AC   risperiDONE  1 mg Oral BID   Continuous Infusions:  PRN Meds: acetaminophen **OR** acetaminophen, guaiFENesin-dextromethorphan, HYDROcodone-acetaminophen, hydrOXYzine, LORazepam, Muscle Rub, ondansetron **OR** ondansetron (ZOFRAN) IV   Vital Signs    Vitals:   03/31/21 0800 03/31/21 0837 03/31/21 0843 03/31/21 0907  BP: (!) 146/79   (!) 147/69  Pulse: (!) 121  86 (!) 103  Resp: (!) 22  (!) 23   Temp:      TempSrc:      SpO2: 93% 95% 100%   Weight:      Height:        Intake/Output Summary (Last 24 hours) at 03/31/2021 0930 Last data filed at 03/31/2021 0444 Gross per 24 hour  Intake 1719.64 ml  Output 3300 ml  Net -1580.36 ml   Last 3 Weights 03/31/2021 03/28/2021 03/25/2021  Weight (lbs) 148 lb 13 oz 167 lb 1.7 oz 152 lb 12.5 oz  Weight (kg) 67.5 kg 75.8 kg 69.3 kg  Some encounter information  is confidential and restricted. Go to Review Flowsheets activity to see all data.      Telemetry    Atrial fibrillation, HR in 70's to 80's overnight, in the 90's this AM. - Personally Reviewed  ECG    No new tracings.   Physical Exam   GEN: Appearing in no acute distress.   Neck: No JVD Cardiac: Irregularly irregular. 2/6 SEM along RUSB.  Respiratory: No wheezing or rales. Scattered rhonchi.  GI: Soft, nontender, non-distended  MS: No pitting edema; No deformity. Neuro:  Nonfocal  Psych: Normal affect   Labs    High Sensitivity Troponin:   Recent Labs  Lab 03/09/21 1346 03/09/21 1527  TROPONINIHS 7 7     Chemistry Recent Labs  Lab 03/26/21 0431 03/27/21 0356 03/28/21 0737 03/29/21 0430 03/31/21 0425  NA 138   < > 135 138 138  K 3.8   < > 3.9 3.5 3.8  CL 101   < > 94* 97* 97*  CO2 31   < > 33* 34* 34*  GLUCOSE 192*   < > 235* 168* 127*  BUN 8   < > 9 9 7*  CREATININE 0.62   < > 0.58 0.50 0.58  CALCIUM 8.3*   < > 8.1* 8.0* 8.1*  MG 2.0  --   --  2.0 1.9  GFRNONAA >60   < > >60 >60 >60  ANIONGAP 6   < > '8 7 7   '$ < > = values in this interval not displayed.    Lipids No results for input(s): CHOL, TRIG, HDL, LABVLDL, LDLCALC, CHOLHDL in the last 168 hours.  Hematology Recent Labs  Lab 03/27/21 0356 03/29/21 0430 03/31/21 0425  WBC 12.6* 8.2 7.4  RBC 3.42* 3.36* 3.33*  HGB 9.9* 9.3* 9.4*  HCT 31.0* 29.7* 30.3*  MCV 90.6 88.4 91.0  MCH 28.9 27.7 28.2  MCHC 31.9 31.3 31.0  RDW 13.6 13.2 13.1  PLT 374 369 402*   Thyroid No results for input(s): TSH, FREET4 in the last 168 hours.  BNP Recent Labs  Lab 03/27/21 0357  BNP 276.0*    DDimer No results for input(s): DDIMER in the last 168 hours.   Radiology    US Venous Img Upper Uni Right(DVT)  Result Date: 03/29/2021 CLINICAL DATA:  Right forearm edema. History of right upper extremity approach PICC line placement. Evaluate for DVT. EXAM: RIGHT UPPER EXTREMITY VENOUS DOPPLER ULTRASOUND TECHNIQUE:  Gray-scale sonography with graded compression, as well as color Doppler and duplex ultrasound were performed to evaluate the upper extremity deep venous system from the level of the subclavian vein and including the jugular, axillary, basilic, radial, ulnar and upper cephalic vein. Spectral Doppler was utilized to evaluate flow at rest and with distal augmentation maneuvers. COMPARISON:  Chest radiograph-03/28/2021 FINDINGS: Contralateral Subclavian Vein: Respiratory phasicity is normal and symmetric with the symptomatic side. No evidence of thrombus. Normal compressibility. Internal Jugular Vein: No evidence of thrombus. Normal compressibility, respiratory phasicity and response to augmentation. Subclavian Vein: No evidence of thrombus. Normal compressibility, respiratory phasicity and response to augmentation. Axillary Vein: No evidence of thrombus. Normal compressibility, respiratory phasicity and response to augmentation. Cephalic Vein: No evidence of thrombus. Normal compressibility, respiratory phasicity and response to augmentation. Basilic Vein: No evidence of thrombus. Normal compressibility, respiratory phasicity and response to augmentation. Brachial Veins: No evidence of thrombus. Normal compressibility, respiratory phasicity and response to augmentation. Radial Veins: No evidence of thrombus. Normal compressibility, respiratory phasicity and response to augmentation. Ulnar Veins: No evidence of thrombus. Normal compressibility, respiratory phasicity and response to augmentation. Venous Reflux:  None visualized. Other Findings:  None visualized. IMPRESSION: No evidence of DVT within the right upper extremity. Electronically Signed   By: Sandi Mariscal M.D.   On: 03/29/2021 12:59   DG CHEST PORT 1 VIEW  Result Date: 03/31/2021 CLINICAL DATA:  Shortness of breath, LEFT pleural effusion follow-up EXAM: PORTABLE CHEST 1 VIEW COMPARISON:  Portable exam 0742 hours compared to 01/28/2021 FINDINGS: Rotated to  the LEFT. RIGHT arm PICC line tip projects over RIGHT atrium. Enlargement of cardiac silhouette with slight vascular congestion. Mild pulmonary infiltrates which could represent edema or multifocal infection. More significant consolidation or atelectasis involving LEFT lower lobe. Small LEFT and tiny RIGHT pleural effusions. No pneumothorax. Atherosclerotic calcification aorta. Bones demineralized. IMPRESSION: Probable persistent mild pulmonary edema with small LEFT pleural effusion and persistent atelectasis versus consolidation of LEFT lower lobe. Aortic Atherosclerosis (ICD10-I70.0). Electronically Signed   By: Lavonia Dana M.D.   On: 03/31/2021 08:27    Cardiac Studies   Echocardiogram: 09/2019 IMPRESSIONS     1. Left ventricular ejection fraction, by estimation, is 60 to 65%. The  left ventricle has normal function. The left ventricle has no regional  wall motion abnormalities. There is moderate concentric left ventricular  hypertrophy and severe basal septal  hypertrophy .   2. Right ventricular systolic function is normal. The right ventricular  size is normal. Tricuspid regurgitation signal is inadequate for assessing  PA pressure.   3. Left atrial size was mildly dilated.   4. The mitral valve is normal in structure. Mild mitral valve  regurgitation. No evidence of mitral stenosis. Moderate mitral annular  calcification.   5. The aortic valve is tricuspid. Aortic valve regurgitation is not  visualized. Mild to moderate aortic valve sclerosis/calcification is  present, without any evidence of aortic stenosis.   6. The inferior vena cava is normal in size with greater than 50%  respiratory variability, suggesting right atrial pressure of 3 mmHg.   Patient Profile     76 y.o. female with past medical history of paroxysmal atrial fibrillation (diagnosed during admission in 09/2019 with recurrence in 03/2020 but back in NSR by office visit in 04/2020), HTN, HLD, IDDM, SVT, history of  DVT and history of CVA who is currently admitted for PNA and Cardiology consulted for atrial fibrillation with RVR.   Assessment & Plan    1. Paroxysmal Atrial Fibrillation - She has a history of recurrent paroxysmal atrial fibrillation and episodes have been triggered by acute illnesses in the past and she is currently being treated for pneumonia this admission. She was initially receiving IV Amiodarone and has been transitioned to Amiodarone 200 mg twice daily and will likely remain on this for 2 weeks then reduce to 200 mg daily. She is also on short acting Cardizem 30 mg 4 times daily along with Lopressor 12.5 mg every 8 hours. She received short-acting Cardizem this AM and given her stable BP, will adjust this to Cardizem CD '120mg'$  daily. Can likely switch Lopressor to Toprol-XL tomorrow (could switch later today if being discharged). She was listed as being on Amlodipine prior to admission and would not use this given concurrent use of Cardizem. - She remains on Eliquis 5 mg twice daily for anticoagulation which is the appropriate dose at this time based off her age, weight and kidney function.  2. Orthostatic Hypotension - Her BP has overall been stable, at 147/69 on most recent check. She remains on Midodrine 10 mg 3 times daily. Will switch short-acting Cardizem to Cardizem CD as outlined above and anticipate transitioning Lopressor to Toprol-XL tomorrow.  3. Left pleural effusion - She did have a moderate left parapneumonic pleural effusion by CXR on 03/28/2021 and this is small by repeat imaging today. She already received 40 mg of IV Lasix today. Will reassess volume status tomorrow and can determine if she will need a diuretic at the time of discharge (would favor a low-dose or PRN dosing given her orthostatic hypotension).  4. Sepsis in the setting of PNA - Treated with Ceftriaxone and Azithromycin this admission. Further management per the admitting team.  For questions or updates,  please contact [place] Please consult [email or web address] for contact info under        Signed, Erma Heritage, PA-C  03/31/2021, 9:30 AM

## 2021-03-31 NOTE — Progress Notes (Signed)
PROGRESS NOTE  [name] INO:[id number] DOB: Aug 14, 1945 DOA: 03/22/2021 PCP: Carrolyn Meiers, MD  Brief History:  76 year old female with a history of paroxysmal atrial fibrillation on apixaban, hypertension, orthostatic hypotension, anxiety/depression, diabetes mellitus type 2, hyperlipidemia, CVA, chronic pain, schizophrenia, GERD presenting with 2-day history of coughing and shortness of breath from high [place].  History is limited secondary to patient being in extremis.  Upon EMS arrival, the patient was noted to have oxygen saturation 80% on room air.  She was noted to have atrial fibrillation with RVR.  The patient denies any chest pain but complains of shortness of breath and coughing.  There is no history of nausea, vomiting, diarrhea, domino pain.  In the ED, the patient had a fever up to 101.7 F with tachycardia initially in the 170s.  She was hemodynamically stable.  She was placed on BiPAP.  She was started on vancomycin, cefepime, metronidazole.  Chest x-ray showed bilateral interstitial and airspace opacities.  Lactic acid was 2.5 >>2.1.  Sodium 135, potassium 3.8, bicarbonate 27, serum creatinine 0.75.  LFTs were unremarkable.  WBC 14.0, hemoglobin 12.0, platelets 295,000.  03/23/2021:  pt taken off bipap in AM, on nasal cannula.  Cardiology stopped IV diltiazem and lopressor and started IV amiodarone.    03/24/2021: Afib RVR persists overnight, remains on IV amiodarone.  Cardio added digoxin.  Still with dyspnea symptoms and palpitations.    03/25/2021: Pt reports that she still has shortness of breath and palpitations and her anxiety is out of control. She remains on IV amiodarone. Additional IV bolus ordered by Dr. Harl Bowie.   03/26/2021: remains in afibRVR, on IV amio, oral diltiazem, oral digoxin, HR slightly better.  Complains of weakness, but no CP or SOB, c/o feeling anxious.   03/27/2021: Afib still difficult to control. PICC line placed 3/4. Brief time patient was  off IV amiod on 3/4 due to no IV access.  BP tolerating oral diltiazem.  Pt remains very anxious.  She reports palpitations.  She denies cough, chest pain and SOB. Plan to get up to chair today.    03/28/2021: Breathing much improved.  Back to baseline oxygen requirements.  Afib RVR persists on IV amiodarone, cardizem, digoxin.  Planning PT eval when heart rate better controlled for better idea of disposition.    03/29/2021: HR starting to improve. Completed full course of antibiotics for pneumonia.  Working on ambulating and working on discharge placement to rehab.  Cardiology says could possibly transition to oral agents 3/8.   03/30/21:  d/c digoxin.  Continue po diltiazem.  IV>>po amio.  PO metoprolol added.  HRs better controlled  03/31/21:  back on [place] oxygen, appears a little SOB;  hold discharge;  repeat CXR, may need thora of left pleural effusion    Assessment and Plan: * Acute respiratory failure with hypoxia ([place]) Secondary to pneumonia and afib RVR Initially required BiPAP Off BiPAP--she has been weaned back to RA 3/9--back on oxygen, appears more sob --obtain CXR --check  PCT, CBC --may need thora  Edema of right forearm Venous duplex negative for DVT  Paroxysmal atrial fibrillation ([place]) Currently in RVR cardiology team added oral diltiazem 3/3 Remains on IV amiodarone and oral cardizem Oral metoprolol added, IV>>po amio   Sepsis due to undetermined organism ([place]) SEPSIS PHYSIOLOGY RESOLVED NOW Present on admission Due to pneumonia Presented with fever, leukocytosis and respiratory failure - all treated and resolved Lactic 2.5>>2.1 Completed IVF Completed  course of IV abx 03/28/21 Blood culture no growth to date.    Lobar pneumonia ([place]) Treated with ceftriaxone/azithromycin for full 7 days MRSA screen negative  Recommend patient get PA/Lat CXR in 4-6 weeks to document resolution.   Atrial fibrillation with RVR (HCC) IV amiodarone per cardiology starting 03/23/21,  dig added 3/2 Additional IV bolus ordered on 3/3 by Dr. Harl Bowie Digoxin initially started by cardiology.   ( brief period off IV amio on 3/4 due to lost IV access) PICC line now in place Remained on IV amiodarone and oral cardizem Oral metoprolol added, IV>>po amio on 3/8 3/9-HRs 100-110  Schizophrenia (HCC) Continue Abilify, Risperdal, Remeron, Cymbalta  Orthostasis Continue home midodrine  Mixed hyperlipidemia Continue statin  Essential hypertension, benign Now back on oral cardizem per cardiology service.  BP tolerating oral cardizem so far.   PO metoprolol added 3/8  Uncontrolled type 2 diabetes mellitus with hyperglycemia, with long-term current use of insulin (HCC) Continue Semglee 24 units plus novolog 8 unit TIDAC, plus SSI, NovoLog sliding scale hemoglobin A1c 7.1%.      Status is: Inpatient Remains inpatient appropriate because: severity of illness requiring BiPAP and IV chronotropic meds       Family Communication: no  Family at bedside   Consultants:  cardiology   Code Status:  FULL    DVT Prophylaxis:  apixaban     Procedures: As Listed in Progress Note Above   Antibiotics: Finished 7 days abx              Subjective: Patient feels a little sob.  Denies f/c, cp, n/v/d, abd pain  Objective: Vitals:   03/30/21 2100 03/30/21 2200 03/31/21 0443 03/31/21 0722  BP: (!) 130/58 (!) 143/65    Pulse: (!) 106 62    Resp: (!) 21 20    Temp:   97.8 F (36.6 C) 98.5 F (36.9 C)  TempSrc:   Axillary Oral  SpO2: 96% 96%    Weight:   67.5 kg   Height:        Intake/Output Summary (Last 24 hours) at 03/31/2021 0730 Last data filed at 03/31/2021 0444 Gross per 24 hour  Intake 1719.64 ml  Output 3300 ml  Net -1580.36 ml   Weight change:  Exam:  General:  Pt is alert, follows commands appropriately, not in acute distress HEENT: No icterus, No thrush, No neck mass, [place]/AT Cardiovascular: RRR, S1/S2, no rubs, no gallops Respiratory: bilateral  rales.  Diminished BS on left Abdomen: Soft/+BS, non tender, non distended, no guarding Extremities: trace LE edema, No lymphangitis, No petechiae, No rashes, no synovitis   Data Reviewed: I have personally reviewed following labs and imaging studies Basic Metabolic Panel: Recent Labs  Lab 03/25/21 0442 03/26/21 0431 03/27/21 0356 03/28/21 0737 03/29/21 0430 03/31/21 0425  NA 135 138 133* 135 138 138  K 3.9 3.8 3.9 3.9 3.5 3.8  CL 101 101 96* 94* 97* 97*  CO2 28 31 32 33* 34* 34*  GLUCOSE 244* 192* 204* 235* 168* 127*  BUN 7* '8 8 9 9 '$ 7*  CREATININE 0.56 0.62 0.52 0.58 0.50 0.58  CALCIUM 8.2* 8.3* 8.1* 8.1* 8.0* 8.1*  MG 1.8 2.0  --   --  2.0 1.9   Liver Function Tests: No results for input(s): AST, ALT, ALKPHOS, BILITOT, PROT, ALBUMIN in the last 168 hours. No results for input(s): LIPASE, AMYLASE in the last 168 hours. No results for input(s): AMMONIA in the last 168 hours. Coagulation Profile: No results for  input(s): INR, PROTIME in the last 168 hours. CBC: Recent Labs  Lab 03/25/21 0442 03/26/21 0431 03/27/21 0356 03/29/21 0430  WBC 9.8 9.3 12.6* 8.2  NEUTROABS 7.9* 6.3 9.9* 6.1  HGB 9.9* 9.6* 9.9* 9.3*  HCT 31.7* 30.1* 31.0* 29.7*  MCV 89.8 90.7 90.6 88.4  PLT 295 330 374 369   Cardiac Enzymes: No results for input(s): CKTOTAL, CKMB, CKMBINDEX, TROPONINI in the last 168 hours. BNP: Invalid input(s): POCBNP CBG: Recent Labs  Lab 03/30/21 0722 03/30/21 1109 03/30/21 1700 03/30/21 2117 03/31/21 0724  GLUCAP 178* 228* 100* 197* 136*   HbA1C: No results for input(s): HGBA1C in the last 72 hours. Urine analysis:    Component Value Date/Time   COLORURINE YELLOW 03/23/2021 [place] 03/23/2021 1835   APPEARANCEUR Cloudy (A) 05/01/2016 1525   LABSPEC 1.025 03/23/2021 1835   PHURINE 5.0 03/23/2021 1835   GLUCOSEU NEGATIVE 03/23/2021 1835   HGBUR NEGATIVE 03/23/2021 1835   BILIRUBINUR NEGATIVE 03/23/2021 1835   BILIRUBINUR Negative  05/01/2016 1525   KETONESUR 5 (A) 03/23/2021 1835   PROTEINUR NEGATIVE 03/23/2021 1835   UROBILINOGEN 0.2 09/14/2014 1703   NITRITE NEGATIVE 03/23/2021 1835   LEUKOCYTESUR NEGATIVE 03/23/2021 1835   Sepsis Labs: '@LABRCNTIP'$ (procalcitonin:4,lacticidven:4) ) Recent Results (from the past 240 hour(s))  Resp Panel by RT-PCR (Flu A&B, Covid) Nasopharyngeal Swab     Status: None   Collection Time: 03/22/21  9:07 AM   Specimen: Nasopharyngeal Swab; Nasopharyngeal(NP) swabs in vial transport medium  Result Value Ref Range Status   SARS Coronavirus 2 by RT PCR NEGATIVE NEGATIVE Final    Comment: (NOTE) SARS-CoV-2 target nucleic acids are NOT DETECTED.  The SARS-CoV-2 RNA is generally detectable in upper respiratory specimens during the acute phase of infection. The lowest concentration of SARS-CoV-2 viral copies this assay can detect is 138 copies/mL. A negative result does not preclude SARS-Cov-2 infection and should not be used as the sole basis for treatment or other patient management decisions. A negative result may occur with  improper specimen collection/handling, submission of specimen other than nasopharyngeal swab, presence of viral mutation(s) within the areas targeted by this assay, and inadequate number of viral copies(<138 copies/mL). A negative result must be combined with clinical observations, patient history, and epidemiological information. The expected result is Negative.  Fact Sheet for Patients:  EntrepreneurPulse.com.au  Fact Sheet for Healthcare Providers:  IncredibleEmployment.be  This test is no t yet approved or cleared by the [place] FDA and  has been authorized for detection and/or diagnosis of SARS-CoV-2 by FDA under an Emergency Use Authorization (EUA). This EUA will remain  in effect (meaning this test can be used) for the duration of the COVID-19 declaration under Section 564(b)(1) of the Act, 21 U.S.C.section  360bbb-3(b)(1), unless the authorization is terminated  or revoked sooner.       Influenza A by PCR NEGATIVE NEGATIVE Final   Influenza B by PCR NEGATIVE NEGATIVE Final    Comment: (NOTE) The Xpert Xpress SARS-CoV-2/FLU/RSV plus assay is intended as an aid in the diagnosis of influenza from Nasopharyngeal swab specimens and should not be used as a sole basis for treatment. Nasal washings and aspirates are unacceptable for Xpert Xpress SARS-CoV-2/FLU/RSV testing.  Fact Sheet for Patients: EntrepreneurPulse.com.au  Fact Sheet for Healthcare Providers: IncredibleEmployment.be  This test is not yet approved or cleared by the [place] FDA and has been authorized for detection and/or diagnosis of SARS-CoV-2 by FDA under an Emergency Use Authorization (EUA). This EUA will remain  in effect (meaning this test can be used) for the duration of the COVID-19 declaration under Section 564(b)(1) of the Act, 21 U.S.C. section 360bbb-3(b)(1), unless the authorization is terminated or revoked.  Performed at [facility], [address], [place], [place] [address]   Blood Culture (routine x 2)     Status: None   Collection Time: 03/22/21  9:19 AM   Specimen: BLOOD  Result Value Ref Range Status   Specimen Description BLOOD BLOOD RIGHT HAND  Final   Special Requests   Final    BOTTLES DRAWN AEROBIC AND ANAEROBIC Blood Culture results may not be optimal due to an inadequate volume of blood received in culture bottles   Culture   Final    NO GROWTH 6 DAYS Performed at [facility], [address], [place], [place] [address]    Report Status 03/28/2021 FINAL  Final  Blood Culture (routine x 2)     Status: None   Collection Time: 03/22/21  9:38 AM   Specimen: BLOOD  Result Value Ref Range Status   Specimen Description BLOOD LEFT ANTECUBITAL  Final   Special Requests   Final    Blood Culture adequate volume BOTTLES DRAWN AEROBIC AND ANAEROBIC   Culture    Final    NO GROWTH 6 DAYS Performed at [facility], [address], [place], [place] [address]    Report Status 03/28/2021 FINAL  Final  MRSA Next Gen by PCR, Nasal     Status: None   Collection Time: 03/22/21  9:24 PM   Specimen: Nasal Mucosa; Nasal Swab  Result Value Ref Range Status   MRSA by PCR Next Gen NOT DETECTED NOT DETECTED Final    Comment: (NOTE) The GeneXpert MRSA Assay (FDA approved for NASAL specimens only), is one component of a comprehensive MRSA colonization surveillance program. It is not intended to diagnose MRSA infection nor to guide or monitor treatment for MRSA infections. Test performance is not FDA approved in patients less than 8 years old. Performed at [facility], [address] [place], [place] [address]      Scheduled Meds:  amiodarone  200 mg Oral BID   apixaban  5 mg Oral BID   ARIPiprazole  2 mg Oral Daily   atorvastatin  40 mg Oral Daily   Chlorhexidine Gluconate Cloth  6 each Topical Daily   diltiazem  30 mg Oral QID   DULoxetine  120 mg Oral Daily   gabapentin  300 mg Oral TID   guaiFENesin  600 mg Oral BID   insulin aspart  0-9 Units Subcutaneous TID WC   insulin aspart  8 Units Subcutaneous TID WC   insulin glargine-yfgn  24 Units Subcutaneous QHS   ipratropium-albuterol  3 mL Nebulization BID   metoprolol tartrate  12.5 mg Oral Q8H   midodrine  10 mg Oral TID   mirabegron ER  25 mg Oral Daily   mirtazapine  30 mg Oral QHS   pantoprazole  40 mg Oral BID AC   risperiDONE  1 mg Oral BID   Continuous Infusions:  Procedures/Studies: DG Chest 1 View  Result Date: 03/09/2021 CLINICAL DATA:  Weakness, pain EXAM: CHEST  1 VIEW COMPARISON:  Bibasilar atelectasis, 02/20/2020 FINDINGS: Enlargement of cardiac silhouette. Mediastinal contours and pulmonary vascularity normal. Atherosclerotic calcification aorta. Bibasilar atelectasis. No definite infiltrate, pleural effusion, or pneumothorax. Bones demineralized. IMPRESSION: Enlargement of  cardiac silhouette with bibasilar atelectasis. Aortic Atherosclerosis (ICD10-I70.0). Electronically Signed   By: Crist Infante.D.  On: 03/09/2021 14:41   CT Head Wo Contrast  Result Date: 03/09/2021 CLINICAL DATA:  Headache and weakness. EXAM: CT HEAD WITHOUT CONTRAST TECHNIQUE: Contiguous axial images were obtained from the base of the skull through the vertex without intravenous contrast. RADIATION DOSE REDUCTION: This exam was performed according to the departmental dose-optimization program which includes automated exposure control, adjustment of the mA and/or kV according to patient size and/or use of iterative reconstruction technique. COMPARISON:  MRI brain and CT head dated October 06, 2016. FINDINGS: Brain: No evidence of acute infarction, hemorrhage, hydrocephalus, extra-axial collection or mass lesion/mass effect. Stable atrophy and chronic microvascular ischemic changes. Vascular: Calcified atherosclerosis at the skull base. No hyperdense vessel. Skull: Negative for fracture or focal lesion. Sinuses/Orbits: No acute finding.  Prior endoscopic sinus surgery. Other: None. IMPRESSION: 1. No acute intracranial abnormality. Stable atrophy and chronic microvascular ischemic changes. Electronically Signed   By: Titus Dubin M.D.   On: 03/09/2021 14:19   US Venous Img Upper Uni Right(DVT)  Result Date: 03/29/2021 CLINICAL DATA:  Right forearm edema. History of right upper extremity approach PICC line placement. Evaluate for DVT. EXAM: RIGHT UPPER EXTREMITY VENOUS DOPPLER ULTRASOUND TECHNIQUE: Gray-scale sonography with graded compression, as well as color Doppler and duplex ultrasound were performed to evaluate the upper extremity deep venous system from the level of the subclavian vein and including the jugular, axillary, basilic, radial, ulnar and upper cephalic vein. Spectral Doppler was utilized to evaluate flow at rest and with distal augmentation maneuvers. COMPARISON:  Chest  radiograph-03/28/2021 FINDINGS: Contralateral Subclavian Vein: Respiratory phasicity is normal and symmetric with the symptomatic side. No evidence of thrombus. Normal compressibility. Internal Jugular Vein: No evidence of thrombus. Normal compressibility, respiratory phasicity and response to augmentation. Subclavian Vein: No evidence of thrombus. Normal compressibility, respiratory phasicity and response to augmentation. Axillary Vein: No evidence of thrombus. Normal compressibility, respiratory phasicity and response to augmentation. Cephalic Vein: No evidence of thrombus. Normal compressibility, respiratory phasicity and response to augmentation. Basilic Vein: No evidence of thrombus. Normal compressibility, respiratory phasicity and response to augmentation. Brachial Veins: No evidence of thrombus. Normal compressibility, respiratory phasicity and response to augmentation. Radial Veins: No evidence of thrombus. Normal compressibility, respiratory phasicity and response to augmentation. Ulnar Veins: No evidence of thrombus. Normal compressibility, respiratory phasicity and response to augmentation. Venous Reflux:  None visualized. Other Findings:  None visualized. IMPRESSION: No evidence of DVT within the right upper extremity. Electronically Signed   By: Sandi Mariscal M.D.   On: 03/29/2021 12:59   DG CHEST PORT 1 VIEW  Result Date: 03/28/2021 CLINICAL DATA:  76 year old female with history of pneumonia. EXAM: PORTABLE CHEST 1 VIEW COMPARISON:  Chest x-ray 03/26/2021. FINDINGS: There is a right upper extremity PICC with tip terminating in the right atrium. Right persistent opacity throughout the left mid to lower hemithorax. Moderate to large left pleural effusion. No definite right pleural effusion. There is cephalization of the pulmonary vasculature, indistinctness of the interstitial markings, and patchy airspace disease throughout the lungs bilaterally suggestive of moderate pulmonary edema. Mild  cardiomegaly. Upper mediastinal contours are distorted by patient rotation to the left. Atherosclerotic calcifications in the thoracic aorta. IMPRESSION: 1. Compared to the prior study, the degree of pulmonary edema appears improved. 2. However, there continues to be extensive airspace consolidation throughout the left mid to lower lung, suggesting pneumonia. 3. Moderate left parapneumonic pleural effusion, unchanged. 4. Aortic atherosclerosis. Electronically Signed   By: Vinnie Langton M.D.   On: 03/28/2021 05:51   DG  CHEST PORT 1 VIEW  Result Date: 03/26/2021 CLINICAL DATA:  Shortness of breath; PICC line placement EXAM: PORTABLE CHEST 1 VIEW COMPARISON:  March 14, 2021 FINDINGS: The cardiomediastinal silhouette is unchanged and enlarged in contour.RIGHT upper extremity PICC tip terminates over the superior RIGHT atrium. Small LEFT pleural effusion. Likely trace RIGHT pleural effusion. No pneumothorax. Increased bilateral airspace opacities. Increased LEFT retrocardiac opacification. Visualized abdomen is unremarkable. Multilevel degenerative changes of the thoracic spine. IMPRESSION: 1. RIGHT upper extremity PICC tip terminates over the superior RIGHT atrium. 2. Increased patchy bilateral airspace opacities as well as increased LEFT retrocardiac consolidation. Differential considerations include pulmonary edema with scattered areas of atelectasis versus multifocal infection. Electronically Signed   By: Valentino Saxon M.D.   On: 03/26/2021 17:08   DG Chest Port 1 View  Result Date: 03/22/2021 CLINICAL DATA:  Questionable sepsis. EXAM: PORTABLE CHEST 1 VIEW COMPARISON:  03/09/2021 FINDINGS: Patchy interstitial and alveolar airspace opacities throughout bilateral lungs concerning for multilobar pneumonia including atypical viral pneumonia. No pleural effusion or pneumothorax. Stable cardiomegaly. No acute osseous abnormality. IMPRESSION: 1. Patchy interstitial and alveolar airspace opacities  throughout bilateral lungs concerning for multilobar pneumonia including atypical viral pneumonia. Electronically Signed   By: Kathreen Devoid M.D.   On: 03/22/2021 09:29   DG Knee Complete 4 Views Right  Result Date: 03/09/2021 CLINICAL DATA:  RIGHT leg pain and weakness EXAM: RIGHT KNEE - COMPLETE 4+ VIEW COMPARISON:  02/28/2017 FINDINGS: Osseous demineralization. Tricompartmental joint space narrowing and spur formation. Chondrocalcinosis question CPPD. No acute fracture, dislocation, or bone destruction. No joint effusion. IMPRESSION: Osseous demineralization with degenerative changes and probable CPPD RIGHT knee. No acute osseous abnormalities. Electronically Signed   By: Lavonia Dana M.D.   On: 03/09/2021 14:43   DG HIPS BILAT WITH PELVIS 3-4 VIEWS  Result Date: 03/09/2021 CLINICAL DATA:  Weakness, pain EXAM: DG HIP (WITH OR WITHOUT PELVIS) 3-4V BILAT COMPARISON:  10/06/2016 FINDINGS: Osseous demineralization. Hip and SI joint spaces preserved. No fracture, dislocation, or bone destruction. IMPRESSION: Osseous demineralization. No acute abnormalities. Electronically Signed   By: Lavonia Dana M.D.   On: 03/09/2021 14:42   [place] EKG SITE RITE  Result Date: 03/26/2021 If Site Rite image not attached, placement could not be confirmed due to current cardiac rhythm.   Orson Eva, DO  [facility]  If 7PM-7AM, please contact night-coverage [email or web address] Password [email or web address] 03/31/2021, 7:30 AM   LOS: 9 days

## 2021-03-31 NOTE — TOC Progression Note (Addendum)
Transition of Care (TOC) - Progression Note  ? ? ?Patient Details  ?Name: [name] ?MRN: [id number] ?Date of Birth: 11-Dec-1945 ? ?Transition of Care (TOC) CM/SW Contact  ?Shade Flood, LCSW ?Phone Number: ?03/31/2021, 10:51 AM ? ?Clinical Narrative:    ? ?TOC following. MD states pt is not medically stable for dc today. He is anticipating possible dc tomorrow. Updated Bryson Ha at [facility]. Will follow. ? ?[employer] asking if pt/guardian agreeable to Covid booster administration at the hospital prior to dc. This LCSW spoke with pt's guardian, [name], to inquire about the booster. Almyra Free states that she is agreeable to pt receiving the [employer] while at [employer]. Updated MD.  ? ?Expected Discharge Plan: [place] ?Barriers to Discharge: Continued Medical Work up ? ?Expected Discharge Plan and Services ?Expected Discharge Plan: [place] ?In-house Referral: Clinical Social Work ?Discharge Planning Services: CM Consult ?Post Acute Care Choice: [place] ?Living arrangements for the past 2 months: [place] ?                ?  ?  ?  ?  ?  ?  ?  ?  ?  ?  ? ? ?Social Determinants of Health (SDOH) Interventions ?  ? ?Readmission Risk Interventions ?Readmission Risk Prevention Plan 03/29/2021 03/23/2021  ?Transportation Screening Complete Complete  ?[place] or Home Care Consult - Complete  ?Social Work Consult for [place] Planning/Counseling - Complete  ?Palliative Care Screening - Not Applicable  ?Medication Review Press photographer) - Complete  ?[place] or Home Care Consult Complete -  ?SW Recovery Care/Counseling Consult Complete -  ?Palliative Care Screening Not Applicable -  ?Skilled Nursing Facility Complete -  ?Some recent data might be hidden  ? ? ?

## 2021-04-01 DIAGNOSIS — I4819 Other persistent atrial fibrillation: Secondary | ICD-10-CM

## 2021-04-01 LAB — BASIC METABOLIC PANEL
Anion gap: 6 (ref 5–15)
BUN: 10 mg/dL (ref 8–23)
CO2: 35 mmol/L — ABNORMAL HIGH (ref 22–32)
Calcium: 8.4 mg/dL — ABNORMAL LOW (ref 8.9–10.3)
Chloride: 95 mmol/L — ABNORMAL LOW (ref 98–111)
Creatinine, Ser: 0.61 mg/dL (ref 0.44–1.00)
GFR, Estimated: >60 mL/min (ref >60)
Glucose, Bld: 189 mg/dL — ABNORMAL HIGH (ref 70–99)
Potassium: 3.8 mmol/L (ref 3.5–5.1)
Sodium: 136 mmol/L (ref 135–145)

## 2021-04-01 LAB — GLUCOSE, CAPILLARY
Glucose-Capillary: 217 mg/dL — ABNORMAL HIGH (ref 70–99)
Glucose-Capillary: 205 mg/dL — ABNORMAL HIGH (ref 70–99)
Glucose-Capillary: 309 mg/dL — ABNORMAL HIGH (ref 70–99)

## 2021-04-01 LAB — MAGNESIUM: Magnesium: 2 mg/dL (ref 1.7–2.4)

## 2021-04-01 MED ORDER — METOPROLOL SUCCINATE ER 50 MG PO TB24: 50.0000 mg | ORAL_TABLET | Freq: Every day | ORAL | Status: DC

## 2021-04-01 MED ORDER — METOPROLOL SUCCINATE ER 50 MG PO TB24
50.0000 mg | ORAL_TABLET | Freq: Every day | ORAL | Status: DC
  Administered 2021-04-01: 50 mg via ORAL
  Filled 2021-04-01: qty 1

## 2021-04-01 MED ORDER — HYDROCODONE-ACETAMINOPHEN 10-325 MG PO TABS: 1.0000 | ORAL_TABLET | Freq: Three times a day (TID) | ORAL | 0 refills | Status: DC | PRN

## 2021-04-01 MED ORDER — CLONAZEPAM 0.5 MG PO TABS
0.5000 mg | ORAL_TABLET | Freq: Three times a day (TID) | ORAL | 0 refills | Status: DC | PRN
Start: 2021-04-01 — End: 2021-05-20

## 2021-04-01 MED ORDER — AMIODARONE HCL 200 MG PO TABS: 200.0000 mg | ORAL_TABLET | Freq: Two times a day (BID) | ORAL | Status: DC

## 2021-04-01 MED ORDER — ZOLPIDEM TARTRATE 5 MG PO TABS: 5.0000 mg | ORAL_TABLET | Freq: Every evening | ORAL | 0 refills | Status: DC | PRN

## 2021-04-01 NOTE — TOC Transition Note (Signed)
Transition of Care (TOC) - CM/SW Discharge Note ? ? ?Patient Details  ?Name: [name] ?MRN: [id number] ?Date of Birth: [birth year] ? ?Transition of Care (TOC) CM/SW Contact:  ?Iona Beard, LCSWA ?Phone Number: ?04/01/2021, 12:24 PM ? ? ?Clinical Narrative:    ?TOC made aware of pts readiness for D/C to SNF. CSW updated pts legal guardian [name]) of D/C and D/C paperwork will be sent to her via email. CSW provided RN with number for report and room number. CSW sent D/C summary and orders along with transfer summary in HUB to facility. Med necessity has been printed to the floor. EMS has been called for transport. TOC signing off.  ? ?Final next level of care: [place] ?Barriers to Discharge: Barriers Resolved ? ? ?Patient Goals and CMS Choice ?Patient states their goals for this hospitalization and ongoing recovery are:: rehab ?CMS Medicare.gov Compare Post Acute Care list provided to:: Legal Guardian ?Choice offered to / list presented to : [facility] POA / Guardian ? ?Discharge Placement ?  ?           ?Patient chooses bed at: [facility] ?Patient to be transferred to facility by: EMS ?Name of family member notified: [name] (legal guardian) ?Patient and family notified of of transfer: 04/01/21 ? ?Discharge Plan and Services ?In-house Referral: Clinical Social Work ?Discharge Planning Services: CM Consult ?Post Acute Care Choice: [place]          ?  ?  ?  ?  ?  ?  ?  ?  ?  ?  ? ?Social Determinants of Health (SDOH) Interventions ?  ? ? ?Readmission Risk Interventions ?Readmission Risk Prevention Plan 03/29/2021 03/23/2021  ?Transportation Screening Complete Complete  ?[place] or Home Care Consult - Complete  ?Social Work Consult for [place] Planning/Counseling - Complete  ?Palliative Care Screening - Not Applicable  ?Medication Review Press photographer) - Complete  ?[place] or Home Care Consult Complete -  ?SW Recovery Care/Counseling Consult Complete -  ?Palliative  Care Screening Not Applicable -  ?Skilled Nursing Facility Complete -  ?Some recent data might be hidden  ? ? ? ? ? ?

## 2021-04-01 NOTE — Progress Notes (Signed)
Discharged to [place] called and given to [employer] LPN. Vital signs stable. Transported  by EMS  of  [facility] to awaiting facility. ?

## 2021-04-01 NOTE — Progress Notes (Addendum)
Nsg Discharge Note ? ?Admit Date:  03/22/2021 ?Discharge date: 04/01/2021 ?  ?[name] to be D/C'd [place] per MD order.  AVS completed.  Copy for chart, and copy for patient signed, and dated. ?Patient/caregiver able to verbalize understanding. ? ?Discharge Medication: ?Allergies as of 04/01/2021   ? ?   Reactions  ? Sulfa Antibiotics Rash  ? ?  ? ?  ?Medication List  ?  ? ?STOP taking these medications   ? ?amLODipine 5 MG tablet ?Commonly known as: NORVASC ?  ?Anti-Diarrheal 2 MG tablet ?Generic drug: loperamide ?  ?EasyMax Test test strip ?Generic drug: glucose blood ?  ?Lancets 28G Misc ?  ?NovoFine Autocover Pen Needle 30G X 8 MM Misc ?Generic drug: Insulin Pen Needle ?  ? ?  ? ?TAKE these medications   ? ?Abilify 2 MG tablet ?Generic drug: ARIPiprazole ?Take 1 tablet (2 mg total) by mouth daily. ?  ?acetaminophen 325 MG tablet ?Commonly known as: TYLENOL ?Take 650 mg by mouth 3 (three) times daily as needed for mild pain or moderate pain. ?  ?amiodarone 200 MG tablet ?Commonly known as: PACERONE ?Take 1 tablet (200 mg total) by mouth 2 (two) times daily. X 5 days, then one tablet (200 mg) daily thereafter ?  ?apixaban 5 MG Tabs tablet ?Commonly known as: ELIQUIS ?Take 1 tablet (5 mg total) by mouth 2 (two) times daily. ?  ?ARTIFICIAL TEARS OP ?Place 1 drop into both eyes 3 (three) times daily. ?  ?atorvastatin 40 MG tablet ?Commonly known as: LIPITOR ?Take 1 tablet by mouth daily. ?  ?calcium carbonate 500 MG chewable tablet ?Commonly known as: TUMS - dosed in mg elemental calcium ?Chew 1 tablet by mouth 4 (four) times daily as needed for heartburn. ?  ?clonazePAM 0.5 MG tablet ?Commonly known as: KLONOPIN ?Take 1 tablet (0.5 mg total) by mouth 3 (three) times daily as needed for anxiety. ?What changed:  ?when to take this ?reasons to take this ?  ?diltiazem 120 MG 24 hr capsule ?Commonly known as: CARDIZEM CD ?Take 120 mg by mouth daily. ?  ?DULoxetine 60 MG capsule ?Commonly known as:  CYMBALTA ?Take 120 mg by mouth daily. ?  ?ENSURE COMPLETE PO ?Take by mouth daily. ?  ?gabapentin 300 MG capsule ?Commonly known as: NEURONTIN ?Take 1 capsule (300 mg total) by mouth 3 (three) times daily as needed. ?What changed: when to take this ?  ?HYDROcodone-acetaminophen 10-325 MG tablet ?Commonly known as: NORCO ?Take 1 tablet by mouth 3 (three) times daily as needed for severe pain. ?What changed:  ?when to take this ?reasons to take this ?  ?hydrOXYzine 25 MG tablet ?Commonly known as: ATARAX ?Take 1 tablet (25 mg total) by mouth every 8 (eight) hours as needed for anxiety or itching. ?What changed: when to take this ?  ?latanoprost 0.005 % ophthalmic solution ?Commonly known as: XALATAN ?Place 1 drop into both eyes at bedtime. ?  ?Levemir FlexTouch 100 UNIT/ML FlexPen ?Generic drug: insulin detemir ?Inject 55 Units into the skin at bedtime. ?  ?loratadine 10 MG tablet ?Commonly known as: CLARITIN ?Take 10 mg by mouth daily. ?  ?metoprolol succinate 50 MG 24 hr tablet ?Commonly known as: TOPROL-XL ?Take 1 tablet (50 mg total) by mouth daily. Take with or immediately following a meal. ?What changed:  ?medication strength ?how much to take ?when to take this ?additional instructions ?  ?midodrine 10 MG tablet ?Commonly known as: PROAMATINE ?Take 10 mg by mouth 3 (three) times  daily. ?  ?mirabegron ER 25 MG Tb24 tablet ?Commonly known as: MYRBETRIQ ?Take 25 mg by mouth daily. ?  ?mirtazapine 30 MG tablet ?Commonly known as: REMERON ?Take 30 mg by mouth at bedtime. ?  ?NovoLOG FlexPen 100 UNIT/ML FlexPen ?Generic drug: insulin aspart ?INJECT SUBCUTANEOUSLY AS FOLLOWS WITH MEALS: 90-150=10u: 151-200=11u: 201-250=12u: 251-300=13u: 301-350=14u: 351-400=15u: BS>400=16u & CALL MD. ?  ?ondansetron 4 MG tablet ?Commonly known as: ZOFRAN ?Take 1 tablet (4 mg total) by mouth 2 (two) times daily as needed for nausea or vomiting. ?What changed: when to take this ?  ?pantoprazole 40 MG tablet ?Commonly known as:  PROTONIX ?Take 1 tablet (40 mg total) by mouth 2 (two) times daily before a meal. ?  ?polyethylene glycol 17 g packet ?Commonly known as: MIRALAX / GLYCOLAX ?Take 17 g by mouth daily. ?  ?risperiDONE 2 MG tablet ?Commonly known as: RISPERDAL ?Take 0.5 tablets (1 mg total) by mouth daily AND 1 tablet (2 mg total) at bedtime. ?  ?senna 8.6 MG Tabs tablet ?Commonly known as: SENOKOT ?Take 1 tablet (8.6 mg total) by mouth daily. ?  ?Tradjenta 5 MG Tabs tablet ?Generic drug: linagliptin ?TAKE (1) TABLET BY MOUTH ONCE DAILY. ?What changed: See the new instructions. ?  ?Trulance 3 MG Tabs ?Generic drug: Plecanatide ?Take 3 mg by mouth daily. ?  ?Vitamin D3 50 MCG (2000 UT) Tabs ?Take 2,000 Units by mouth daily. ?  ?zolpidem 5 MG tablet ?Commonly known as: AMBIEN ?Take 1 tablet (5 mg total) by mouth at bedtime as needed. ?  ? ?  ? ? ?Discharge Assessment: ?Vitals:  ? 04/01/21 1250 04/01/21 1355  ?BP: (!) 132/54 124/69  ?Pulse: (!) 101 (!) 102  ?Resp:  20  ?Temp:  97.8 ?F (36.6 ?C)  ?SpO2:  97%  ? Skin clean, dry and intact without evidence of skin break down, no evidence of skin tears noted. ?IV catheter discontinued intact. Site without signs and symptoms of complications - no redness or edema noted at insertion site, patient denies c/o pain - only slight tenderness at site.  Dressing with slight pressure applied. ? ?D/c Instructions-Education: ?Discharge instructions given to patient/family with verbalized understanding. ?D/c education completed with patient/family including follow up instructions, medication list, d/c activities limitations if indicated, with other d/c instructions as indicated by MD - patient able to verbalize understanding, all questions fully answered. ?Patient instructed to return to ED, call 911, or call MD for any changes in condition.  ?Patient escorted via [place], and D/C home via private auto. ? ?Dorcas Mcmurray, LPN ?3/32/9518 8:41 PM  ?

## 2021-04-01 NOTE — Discharge Summary (Signed)
Physician Discharge Summary   Patient: [name] MRN: [id number] DOB: Jul 01, 1945  Admit date:     03/22/2021  Discharge date: 04/01/21  Discharge Physician: Shanon Brow Jeanluc Wegman   PCP: Carrolyn Meiers, MD   Recommendations at discharge:   Please follow up with primary care provider within 1-2 weeks  Please repeat BMP and CBC in one week     Hospital Course: 76 year old female with a history of paroxysmal atrial fibrillation on apixaban, hypertension, orthostatic hypotension, anxiety/depression, diabetes mellitus type 2, hyperlipidemia, CVA, chronic pain, schizophrenia, GERD presenting with 2-day history of coughing and shortness of breath from high [place].  History is limited secondary to patient being in extremis.  Upon EMS arrival, the patient was noted to have oxygen saturation 80% on room air.  She was noted to have atrial fibrillation with RVR.  The patient denies any chest pain but complains of shortness of breath and coughing.  There is no history of nausea, vomiting, diarrhea, domino pain.  In the ED, the patient had a fever up to 101.7 F with tachycardia initially in the 170s.  She was hemodynamically stable.  She was placed on BiPAP.  She was started on vancomycin, cefepime, metronidazole.  Chest x-ray showed bilateral interstitial and airspace opacities.  Lactic acid was 2.5 >>2.1.  Sodium 135, potassium 3.8, bicarbonate 27, serum creatinine 0.75.  LFTs were unremarkable.  WBC 14.0, hemoglobin 12.0, platelets 295,000.  03/23/2021:  pt taken off bipap in AM, on nasal cannula.  Cardiology stopped IV diltiazem and lopressor and started IV amiodarone.    03/24/2021: Afib RVR persists overnight, remains on IV amiodarone.  Cardio added digoxin.  Still with dyspnea symptoms and palpitations.    03/25/2021: Pt reports that she still has shortness of breath and palpitations and her anxiety is out of control. She remains on IV amiodarone. Additional IV bolus ordered by Dr. Harl Bowie.   03/26/2021:  remains in afibRVR, on IV amio, oral diltiazem, oral digoxin, HR slightly better.  Complains of weakness, but no CP or SOB, c/o feeling anxious.   03/27/2021: Afib still difficult to control. PICC line placed 3/4. Brief time patient was off IV amiod on 3/4 due to no IV access.  BP tolerating oral diltiazem.  Pt remains very anxious.  She reports palpitations.  She denies cough, chest pain and SOB. Plan to get up to chair today.    03/28/2021: Breathing much improved.  Back to baseline oxygen requirements.  Afib RVR persists on IV amiodarone, cardizem, digoxin.  Planning PT eval when heart rate better controlled for better idea of disposition.    03/29/2021: HR starting to improve. Completed full course of antibiotics for pneumonia.  Working on ambulating and working on discharge placement to rehab.  Cardiology says could possibly transition to oral agents 3/8.   03/30/21:  d/c digoxin.  Continue po diltiazem.  IV>>po amio.  PO metoprolol added.  HRs better controlled  03/31/21:  appeared a little sob. CXR showed increased interstitial markings.   Lasix 40 mg IV x 1  given with clinical improvement>>weaned off oxygen  03/31/21:  back on [place] oxygen, appears a little SOB;  hold discharge;  repeat CXR, may need thora of left pleural effusion  Assessment and Plan: * Acute respiratory failure with hypoxia ([place]) Secondary to pneumonia and afib RVR Initially required BiPAP Off BiPAP--she has been weaned back to RA 3/9--back on oxygen, appears more sob --personally reviewed CXR--increased interstitial markings --lasix 40 mg IV x 1 with clinical improvement 3/10--weaned  off oxygen  Edema of right forearm Venous duplex negative for DVT  Paroxysmal atrial fibrillation (HCC) Currently in RVR>>improved with med adjustment and treatment of underlying acute medical condition cardiology team added oral diltiazem 3/3 Remained on IV amiodarone and oral cardizem Oral metoprolol added, IV>>po amio D/C with amiodarone  200 mg bid x 5 days, then 200 mg daily thereafter D/C with metoprolol succinate 50 mg daily and Cardizem CD 120 daily   Sepsis due to undetermined organism ([place]) SEPSIS PHYSIOLOGY RESOLVED NOW Present on admission Due to pneumonia Presented with fever, leukocytosis and respiratory failure - all treated and resolved Lactic 2.5>>2.1 Completed IVF Completed course of IV abx 03/28/21 Blood culture no growth to date.    Lobar pneumonia ([place]) Treated with ceftriaxone/azithromycin for full 7 days MRSA screen negative  Recommend patient get PA/Lat CXR in 4-6 weeks to document resolution.   Atrial fibrillation with RVR (HCC) IV amiodarone per cardiology starting 03/23/21, dig added 3/2 Additional IV bolus ordered on 3/3 by Dr. Harl Bowie Digoxin initially started by cardiology.   ( brief period off IV amio on 3/4 due to lost IV access) PICC line now in place Remained on IV amiodarone and oral cardizem Oral metoprolol added, IV>>po amio on 3/8 See above for regimen at time of d/c  Schizophrenia (HCC) Continue Abilify, Risperdal, Remeron, Cymbalta Continue prn clonazepam  Orthostasis Continue home midodrine  Mixed hyperlipidemia Continue statin  Essential hypertension, benign Now back on oral cardizem per cardiology service.  BP tolerating oral cardizem so far.   PO metoprolol added 3/8 D/c with metoprolol succinate 50 mg daily  Uncontrolled type 2 diabetes mellitus with hyperglycemia, with long-term current use of insulin (HCC) Continue Semglee 24 units plus novolog 8 unit TIDAC, plus SSI, NovoLog sliding scale hemoglobin A1c 7.1%.        Pain control - [employer] Controlled Substance Reporting System database was reviewed. and patient was instructed, not to drive, operate heavy machinery, perform activities at heights, swimming or participation in water activities or provide baby-sitting services while on Pain, Sleep and Anxiety Medications; until their outpatient Physician  has advised to do so again. Also recommended to not to take more than prescribed Pain, Sleep and Anxiety Medications.  Consultants: cardiology Procedures performed: none  Disposition: Skilled nursing facility Diet recommendation:  Cardiac diet DISCHARGE MEDICATION: Allergies as of 04/01/2021       Reactions   Sulfa Antibiotics Rash        Medication List     STOP taking these medications    amLODipine 5 MG tablet Commonly known as: NORVASC   Anti-Diarrheal 2 MG tablet Generic drug: loperamide   EasyMax Test test strip Generic drug: glucose blood   Lancets 28G Misc   NovoFine Autocover Pen Needle 30G X 8 MM Misc Generic drug: Insulin Pen Needle       TAKE these medications    Abilify 2 MG tablet Generic drug: ARIPiprazole Take 1 tablet (2 mg total) by mouth daily.   acetaminophen 325 MG tablet Commonly known as: TYLENOL Take 650 mg by mouth 3 (three) times daily as needed for mild pain or moderate pain.   amiodarone 200 MG tablet Commonly known as: PACERONE Take 1 tablet (200 mg total) by mouth 2 (two) times daily. X 5 days, then one tablet (200 mg) daily thereafter   apixaban 5 MG Tabs tablet Commonly known as: ELIQUIS Take 1 tablet (5 mg total) by mouth 2 (two) times daily.   ARTIFICIAL TEARS OP Place 1  drop into both eyes 3 (three) times daily.   atorvastatin 40 MG tablet Commonly known as: LIPITOR Take 1 tablet by mouth daily.   calcium carbonate 500 MG chewable tablet Commonly known as: TUMS - dosed in mg elemental calcium Chew 1 tablet by mouth 4 (four) times daily as needed for heartburn.   clonazePAM 0.5 MG tablet Commonly known as: KLONOPIN Take 1 tablet (0.5 mg total) by mouth 3 (three) times daily as needed for anxiety. What changed:  when to take this reasons to take this   diltiazem 120 MG 24 hr capsule Commonly known as: CARDIZEM CD Take 120 mg by mouth daily.   DULoxetine 60 MG capsule Commonly known as: CYMBALTA Take 120 mg  by mouth daily.   ENSURE COMPLETE PO Take by mouth daily.   gabapentin 300 MG capsule Commonly known as: NEURONTIN Take 1 capsule (300 mg total) by mouth 3 (three) times daily as needed. What changed: when to take this   HYDROcodone-acetaminophen 10-325 MG tablet Commonly known as: NORCO Take 1 tablet by mouth 3 (three) times daily as needed for severe pain. What changed:  when to take this reasons to take this   hydrOXYzine 25 MG tablet Commonly known as: ATARAX Take 1 tablet (25 mg total) by mouth every 8 (eight) hours as needed for anxiety or itching. What changed: when to take this   latanoprost 0.005 % ophthalmic solution Commonly known as: XALATAN Place 1 drop into both eyes at bedtime.   Levemir FlexTouch 100 UNIT/ML FlexPen Generic drug: insulin detemir Inject 55 Units into the skin at bedtime.   loratadine 10 MG tablet Commonly known as: CLARITIN Take 10 mg by mouth daily.   metoprolol succinate 50 MG 24 hr tablet Commonly known as: TOPROL-XL Take 1 tablet (50 mg total) by mouth daily. Take with or immediately following a meal. What changed:  medication strength how much to take when to take this additional instructions   midodrine 10 MG tablet Commonly known as: PROAMATINE Take 10 mg by mouth 3 (three) times daily.   mirabegron ER 25 MG Tb24 tablet Commonly known as: MYRBETRIQ Take 25 mg by mouth daily.   mirtazapine 30 MG tablet Commonly known as: REMERON Take 30 mg by mouth at bedtime.   NovoLOG FlexPen 100 UNIT/ML FlexPen Generic drug: insulin aspart INJECT SUBCUTANEOUSLY AS FOLLOWS WITH MEALS: 90-150=10u: 151-200=11u: 201-250=12u: 251-300=13u: 301-350=14u: 351-400=15u: BS>400=16u & CALL MD.   ondansetron 4 MG tablet Commonly known as: ZOFRAN Take 1 tablet (4 mg total) by mouth 2 (two) times daily as needed for nausea or vomiting. What changed: when to take this   pantoprazole 40 MG tablet Commonly known as: PROTONIX Take 1 tablet (40 mg  total) by mouth 2 (two) times daily before a meal.   polyethylene glycol 17 g packet Commonly known as: MIRALAX / GLYCOLAX Take 17 g by mouth daily.   risperiDONE 2 MG tablet Commonly known as: RISPERDAL Take 0.5 tablets (1 mg total) by mouth daily AND 1 tablet (2 mg total) at bedtime.   senna 8.6 MG Tabs tablet Commonly known as: SENOKOT Take 1 tablet (8.6 mg total) by mouth daily.   Tradjenta 5 MG Tabs tablet Generic drug: linagliptin TAKE (1) TABLET BY MOUTH ONCE DAILY. What changed: See the new instructions.   Trulance 3 MG Tabs Generic drug: Plecanatide Take 3 mg by mouth daily.   Vitamin D3 50 MCG (2000 UT) Tabs Take 2,000 Units by mouth daily.   zolpidem 5 MG tablet  Commonly known as: AMBIEN Take 5 mg by mouth at bedtime as needed.        Discharge Exam: Filed Weights   03/25/21 0600 03/28/21 0404 03/31/21 0443  Weight: 69.3 kg 75.8 kg 67.5 kg   HEENT:  [place]/AT, No thrush, no icterus CV:  IRRR, no rub, no S3, no S4 Lung:  bibasilar rales, no wheeze, no rhonchi Abd:  soft/+BS, NT Ext:  No edema, no lymphangitis, no synovitis, no rash   Condition at discharge: stable  The results of significant diagnostics from this hospitalization (including imaging, microbiology, ancillary and laboratory) are listed below for reference.   Imaging Studies: DG Chest 1 View  Result Date: 03/09/2021 CLINICAL DATA:  Weakness, pain EXAM: CHEST  1 VIEW COMPARISON:  Bibasilar atelectasis, 02/20/2020 FINDINGS: Enlargement of cardiac silhouette. Mediastinal contours and pulmonary vascularity normal. Atherosclerotic calcification aorta. Bibasilar atelectasis. No definite infiltrate, pleural effusion, or pneumothorax. Bones demineralized. IMPRESSION: Enlargement of cardiac silhouette with bibasilar atelectasis. Aortic Atherosclerosis (ICD10-I70.0). Electronically Signed   By: Lavonia Dana M.D.   On: 03/09/2021 14:41   CT Head Wo Contrast  Result Date: 03/09/2021 CLINICAL DATA:   Headache and weakness. EXAM: CT HEAD WITHOUT CONTRAST TECHNIQUE: Contiguous axial images were obtained from the base of the skull through the vertex without intravenous contrast. RADIATION DOSE REDUCTION: This exam was performed according to the departmental dose-optimization program which includes automated exposure control, adjustment of the mA and/or kV according to patient size and/or use of iterative reconstruction technique. COMPARISON:  MRI brain and CT head dated October 06, 2016. FINDINGS: Brain: No evidence of acute infarction, hemorrhage, hydrocephalus, extra-axial collection or mass lesion/mass effect. Stable atrophy and chronic microvascular ischemic changes. Vascular: Calcified atherosclerosis at the skull base. No hyperdense vessel. Skull: Negative for fracture or focal lesion. Sinuses/Orbits: No acute finding.  Prior endoscopic sinus surgery. Other: None. IMPRESSION: 1. No acute intracranial abnormality. Stable atrophy and chronic microvascular ischemic changes. Electronically Signed   By: Titus Dubin M.D.   On: 03/09/2021 14:19   US Venous Img Upper Uni Right(DVT)  Result Date: 03/29/2021 CLINICAL DATA:  Right forearm edema. History of right upper extremity approach PICC line placement. Evaluate for DVT. EXAM: RIGHT UPPER EXTREMITY VENOUS DOPPLER ULTRASOUND TECHNIQUE: Gray-scale sonography with graded compression, as well as color Doppler and duplex ultrasound were performed to evaluate the upper extremity deep venous system from the level of the subclavian vein and including the jugular, axillary, basilic, radial, ulnar and upper cephalic vein. Spectral Doppler was utilized to evaluate flow at rest and with distal augmentation maneuvers. COMPARISON:  Chest radiograph-03/28/2021 FINDINGS: Contralateral Subclavian Vein: Respiratory phasicity is normal and symmetric with the symptomatic side. No evidence of thrombus. Normal compressibility. Internal Jugular Vein: No evidence of thrombus.  Normal compressibility, respiratory phasicity and response to augmentation. Subclavian Vein: No evidence of thrombus. Normal compressibility, respiratory phasicity and response to augmentation. Axillary Vein: No evidence of thrombus. Normal compressibility, respiratory phasicity and response to augmentation. Cephalic Vein: No evidence of thrombus. Normal compressibility, respiratory phasicity and response to augmentation. Basilic Vein: No evidence of thrombus. Normal compressibility, respiratory phasicity and response to augmentation. Brachial Veins: No evidence of thrombus. Normal compressibility, respiratory phasicity and response to augmentation. Radial Veins: No evidence of thrombus. Normal compressibility, respiratory phasicity and response to augmentation. Ulnar Veins: No evidence of thrombus. Normal compressibility, respiratory phasicity and response to augmentation. Venous Reflux:  None visualized. Other Findings:  None visualized. IMPRESSION: No evidence of DVT within the right upper extremity. Electronically Signed  By: Sandi Mariscal M.D.   On: 03/29/2021 12:59   DG CHEST PORT 1 VIEW  Result Date: 03/31/2021 CLINICAL DATA:  Shortness of breath, LEFT pleural effusion follow-up EXAM: PORTABLE CHEST 1 VIEW COMPARISON:  Portable exam 0742 hours compared to 01/28/2021 FINDINGS: Rotated to the LEFT. RIGHT arm PICC line tip projects over RIGHT atrium. Enlargement of cardiac silhouette with slight vascular congestion. Mild pulmonary infiltrates which could represent edema or multifocal infection. More significant consolidation or atelectasis involving LEFT lower lobe. Small LEFT and tiny RIGHT pleural effusions. No pneumothorax. Atherosclerotic calcification aorta. Bones demineralized. IMPRESSION: Probable persistent mild pulmonary edema with small LEFT pleural effusion and persistent atelectasis versus consolidation of LEFT lower lobe. Aortic Atherosclerosis (ICD10-I70.0). Electronically Signed   By: Lavonia Dana M.D.   On: 03/31/2021 08:27   DG CHEST PORT 1 VIEW  Result Date: 03/28/2021 CLINICAL DATA:  76 year old female with history of pneumonia. EXAM: PORTABLE CHEST 1 VIEW COMPARISON:  Chest x-ray 03/26/2021. FINDINGS: There is a right upper extremity PICC with tip terminating in the right atrium. Right persistent opacity throughout the left mid to lower hemithorax. Moderate to large left pleural effusion. No definite right pleural effusion. There is cephalization of the pulmonary vasculature, indistinctness of the interstitial markings, and patchy airspace disease throughout the lungs bilaterally suggestive of moderate pulmonary edema. Mild cardiomegaly. Upper mediastinal contours are distorted by patient rotation to the left. Atherosclerotic calcifications in the thoracic aorta. IMPRESSION: 1. Compared to the prior study, the degree of pulmonary edema appears improved. 2. However, there continues to be extensive airspace consolidation throughout the left mid to lower lung, suggesting pneumonia. 3. Moderate left parapneumonic pleural effusion, unchanged. 4. Aortic atherosclerosis. Electronically Signed   By: Vinnie Langton M.D.   On: 03/28/2021 05:51   DG CHEST PORT 1 VIEW  Result Date: 03/26/2021 CLINICAL DATA:  Shortness of breath; PICC line placement EXAM: PORTABLE CHEST 1 VIEW COMPARISON:  March 14, 2021 FINDINGS: The cardiomediastinal silhouette is unchanged and enlarged in contour.RIGHT upper extremity PICC tip terminates over the superior RIGHT atrium. Small LEFT pleural effusion. Likely trace RIGHT pleural effusion. No pneumothorax. Increased bilateral airspace opacities. Increased LEFT retrocardiac opacification. Visualized abdomen is unremarkable. Multilevel degenerative changes of the thoracic spine. IMPRESSION: 1. RIGHT upper extremity PICC tip terminates over the superior RIGHT atrium. 2. Increased patchy bilateral airspace opacities as well as increased LEFT retrocardiac consolidation.  Differential considerations include pulmonary edema with scattered areas of atelectasis versus multifocal infection. Electronically Signed   By: Valentino Saxon M.D.   On: 03/26/2021 17:08   DG Chest Port 1 View  Result Date: 03/22/2021 CLINICAL DATA:  Questionable sepsis. EXAM: PORTABLE CHEST 1 VIEW COMPARISON:  03/09/2021 FINDINGS: Patchy interstitial and alveolar airspace opacities throughout bilateral lungs concerning for multilobar pneumonia including atypical viral pneumonia. No pleural effusion or pneumothorax. Stable cardiomegaly. No acute osseous abnormality. IMPRESSION: 1. Patchy interstitial and alveolar airspace opacities throughout bilateral lungs concerning for multilobar pneumonia including atypical viral pneumonia. Electronically Signed   By: Kathreen Devoid M.D.   On: 03/22/2021 09:29   DG Knee Complete 4 Views Right  Result Date: 03/09/2021 CLINICAL DATA:  RIGHT leg pain and weakness EXAM: RIGHT KNEE - COMPLETE 4+ VIEW COMPARISON:  02/28/2017 FINDINGS: Osseous demineralization. Tricompartmental joint space narrowing and spur formation. Chondrocalcinosis question CPPD. No acute fracture, dislocation, or bone destruction. No joint effusion. IMPRESSION: Osseous demineralization with degenerative changes and probable CPPD RIGHT knee. No acute osseous abnormalities. Electronically Signed   By: Lavonia Dana  M.D.   On: 03/09/2021 14:43   DG HIPS BILAT WITH PELVIS 3-4 VIEWS  Result Date: 03/09/2021 CLINICAL DATA:  Weakness, pain EXAM: DG HIP (WITH OR WITHOUT PELVIS) 3-4V BILAT COMPARISON:  10/06/2016 FINDINGS: Osseous demineralization. Hip and SI joint spaces preserved. No fracture, dislocation, or bone destruction. IMPRESSION: Osseous demineralization. No acute abnormalities. Electronically Signed   By: Lavonia Dana M.D.   On: 03/09/2021 14:42   [place] EKG SITE RITE  Result Date: 03/26/2021 If Site Rite image not attached, placement could not be confirmed due to current cardiac rhythm.    Microbiology: Results for orders placed or performed during the hospital encounter of 03/22/21  Resp Panel by RT-PCR (Flu A&B, Covid) Nasopharyngeal Swab     Status: None   Collection Time: 03/22/21  9:07 AM   Specimen: Nasopharyngeal Swab; Nasopharyngeal(NP) swabs in vial transport medium  Result Value Ref Range Status   SARS Coronavirus 2 by RT PCR NEGATIVE NEGATIVE Final    Comment: (NOTE) SARS-CoV-2 target nucleic acids are NOT DETECTED.  The SARS-CoV-2 RNA is generally detectable in upper respiratory specimens during the acute phase of infection. The lowest concentration of SARS-CoV-2 viral copies this assay can detect is 138 copies/mL. A negative result does not preclude SARS-Cov-2 infection and should not be used as the sole basis for treatment or other patient management decisions. A negative result may occur with  improper specimen collection/handling, submission of specimen other than nasopharyngeal swab, presence of viral mutation(s) within the areas targeted by this assay, and inadequate number of viral copies(<138 copies/mL). A negative result must be combined with clinical observations, patient history, and epidemiological information. The expected result is Negative.  Fact Sheet for Patients:  EntrepreneurPulse.com.au  Fact Sheet for Healthcare Providers:  IncredibleEmployment.be  This test is no t yet approved or cleared by the [place] FDA and  has been authorized for detection and/or diagnosis of SARS-CoV-2 by FDA under an Emergency Use Authorization (EUA). This EUA will remain  in effect (meaning this test can be used) for the duration of the COVID-19 declaration under Section 564(b)(1) of the Act, 21 U.S.C.section 360bbb-3(b)(1), unless the authorization is terminated  or revoked sooner.       Influenza A by PCR NEGATIVE NEGATIVE Final   Influenza B by PCR NEGATIVE NEGATIVE Final    Comment: (NOTE) The Xpert  Xpress SARS-CoV-2/FLU/RSV plus assay is intended as an aid in the diagnosis of influenza from Nasopharyngeal swab specimens and should not be used as a sole basis for treatment. Nasal washings and aspirates are unacceptable for Xpert Xpress SARS-CoV-2/FLU/RSV testing.  Fact Sheet for Patients: EntrepreneurPulse.com.au  Fact Sheet for Healthcare Providers: IncredibleEmployment.be  This test is not yet approved or cleared by the [place] FDA and has been authorized for detection and/or diagnosis of SARS-CoV-2 by FDA under an Emergency Use Authorization (EUA). This EUA will remain in effect (meaning this test can be used) for the duration of the COVID-19 declaration under Section 564(b)(1) of the Act, 21 U.S.C. section 360bbb-3(b)(1), unless the authorization is terminated or revoked.  Performed at [facility], [address], [place], [place] [address]   Blood Culture (routine x 2)     Status: None   Collection Time: 03/22/21  9:19 AM   Specimen: BLOOD  Result Value Ref Range Status   Specimen Description BLOOD BLOOD RIGHT HAND  Final   Special Requests   Final    BOTTLES DRAWN AEROBIC AND ANAEROBIC Blood Culture results may not be optimal due  to an inadequate volume of blood received in culture bottles   Culture   Final    NO GROWTH 6 DAYS Performed at [facility], [address], [place], [place] [address]    Report Status 03/28/2021 FINAL  Final  Blood Culture (routine x 2)     Status: None   Collection Time: 03/22/21  9:38 AM   Specimen: BLOOD  Result Value Ref Range Status   Specimen Description BLOOD LEFT ANTECUBITAL  Final   Special Requests   Final    Blood Culture adequate volume BOTTLES DRAWN AEROBIC AND ANAEROBIC   Culture   Final    NO GROWTH 6 DAYS Performed at [facility], [address], [place], [place] [address]    Report Status 03/28/2021 FINAL  Final  MRSA Next Gen by PCR, Nasal     Status: None   Collection Time:  03/22/21  9:24 PM   Specimen: Nasal Mucosa; Nasal Swab  Result Value Ref Range Status   MRSA by PCR Next Gen NOT DETECTED NOT DETECTED Final    Comment: (NOTE) The GeneXpert MRSA Assay (FDA approved for NASAL specimens only), is one component of a comprehensive MRSA colonization surveillance program. It is not intended to diagnose MRSA infection nor to guide or monitor treatment for MRSA infections. Test performance is not FDA approved in patients less than 88 years old. Performed at [facility], [address], [place], [place] [address]     Labs: CBC: Recent Labs  Lab 03/26/21 0431 03/27/21 0356 03/29/21 0430 03/31/21 0425  WBC 9.3 12.6* 8.2 7.4  NEUTROABS 6.3 9.9* 6.1  --   HGB 9.6* 9.9* 9.3* 9.4*  HCT 30.1* 31.0* 29.7* 30.3*  MCV 90.7 90.6 88.4 91.0  PLT 330 374 369 536*   Basic Metabolic Panel: Recent Labs  Lab 03/26/21 0431 03/27/21 0356 03/28/21 0737 03/29/21 0430 03/31/21 0425 04/01/21 0452  NA 138 133* 135 138 138 136  K 3.8 3.9 3.9 3.5 3.8 3.8  CL 101 96* 94* 97* 97* 95*  CO2 31 32 33* 34* 34* 35*  GLUCOSE 192* 204* 235* 168* 127* 189*  BUN '8 8 9 9 '$ 7* 10  CREATININE 0.62 0.52 0.58 0.50 0.58 0.61  CALCIUM 8.3* 8.1* 8.1* 8.0* 8.1* 8.4*  MG 2.0  --   --  2.0 1.9 2.0   Liver Function Tests: No results for input(s): AST, ALT, ALKPHOS, BILITOT, PROT, ALBUMIN in the last 168 hours. CBG: Recent Labs  Lab 03/31/21 1605 03/31/21 2210 04/01/21 0228 04/01/21 0737 04/01/21 1127  GLUCAP 146* 217* 217* 205* 309*    Discharge time spent: greater than 30 minutes.  Signed: Orson Eva, MD [facility] 04/01/2021

## 2021-04-01 NOTE — Progress Notes (Signed)
Physical Therapy Treatment ?Patient Details ?Name: [name] ?MRN: [id number] ?DOB: 1945/04/16 ?Today's Date: 04/01/2021 ? ? ?History of Present Illness [name] is a  76 year old female with a history of paroxysmal atrial fibrillation on apixaban, hypertension, orthostatic hypotension, anxiety/depression, diabetes mellitus type 2, hyperlipidemia, CVA, chronic pain, schizophrenia, GERD presenting with 2-day history of coughing and shortness of breath from high [place].  History is limited secondary to patient being in extremis.  Upon EMS arrival, the patient was noted to have oxygen saturation 80% on room air.  She was noted to have atrial fibrillation with RVR.  The patient denies any chest pain but complains of shortness of breath and coughing.  There is no history of nausea, vomiting, diarrhea, domino pain. ? ?  ?PT Comments  ? ? Patient slightly apprehensive to participate with therapy due to stomach ache, but agreeable after encouragement.  Patient demonstrates slow labored movement for sitting up at bedside, very unsteady on feet and limited to a few slow labored side steps with buckling of knees due to weakness, once seated demonstrated fair/good return for completing BLE exercises with verbal cueing and demonstrationg.  Patient tolerated sitting up in chair after therapy - nursing staff notified.  Patient will benefit from continued skilled physical therapy in hospital and recommended venue below to increase strength, balance, endurance for safe ADLs and gait.  ? ?   ?Recommendations for follow up therapy are one component of a multi-disciplinary discharge planning process, led by the attending physician.  Recommendations may be updated based on patient status, additional functional criteria and insurance authorization. ? ?Follow Up Recommendations ? Skilled nursing-short term rehab (<3 hours/day) ?  ?  ?Assistance Recommended at Discharge Intermittent Supervision/Assistance  ?Patient can return home  with the following A lot of help with walking and/or transfers;A little help with bathing/dressing/bathroom;Help with stairs or ramp for entrance;Assistance with cooking/housework ?  ?Equipment Recommendations ? None recommended by PT  ?  ?Recommendations for Other Services   ? ? ?  ?Precautions / Restrictions Precautions ?Precautions: Fall ?Restrictions ?Weight Bearing Restrictions: No  ?  ? ?Mobility ? Bed Mobility ?Overal bed mobility: Needs Assistance ?Bed Mobility: Supine to Sit ?  ?  ?Supine to sit: Min assist, Mod assist, HOB elevated ?  ?  ?General bed mobility comments: slow labored movement ?  ? ?Transfers ?Overall transfer level: Needs assistance ?Equipment used: Rolling walker (2 wheels) ?Transfers: Sit to/from Stand, Bed to chair/wheelchair/BSC ?Sit to Stand: Min assist, Mod assist ?  ?Step pivot transfers: Mod assist ?  ?  ?  ?General transfer comment: slow labored unsteady movement ?  ? ?Ambulation/Gait ?Ambulation/Gait assistance: Mod assist, Max assist ?Gait Distance (Feet): 4 Feet ?Assistive device: Rolling walker (2 wheels) ?Gait Pattern/deviations: Decreased step length - right, Decreased step length - left, Decreased stride length ?Gait velocity: decreased ?  ?  ?General Gait Details: very unsteady on feet and limited to a few slow labored side steps with buckling of knees due to weakness ? ? ?Stairs ?  ?  ?  ?  ?  ? ? ?Wheelchair Mobility ?  ? ?Modified Rankin (Stroke Patients Only) ?  ? ? ?  ?Balance Overall balance assessment: Needs assistance ?Sitting-balance support: Feet supported, No upper extremity supported ?Sitting balance-Leahy Scale: Fair ?Sitting balance - Comments: fair/good seated at EOB ?  ?Standing balance support: During functional activity, Bilateral upper extremity supported ?Standing balance-Leahy Scale: Poor ?Standing balance comment: using RW ?  ?  ?  ?  ?  ?  ?  ?  ?  ?  ?  ?  ? ?  ?  Cognition Arousal/Alertness: Awake/alert ?Behavior During Therapy: Anxious ?Overall  Cognitive Status: Within Functional Limits for tasks assessed ?  ?  ?  ?  ?  ?  ?  ?  ?  ?  ?  ?  ?  ?  ?  ?  ?  ?  ?  ? ?  ?Exercises General Exercises - Lower Extremity ?Ankle Circles/Pumps: Seated, AROM, Strengthening, Both, 10 reps ?Long Arc Quad: Seated, AROM, Strengthening, Both, 10 reps ?Hip Flexion/Marching: Seated, AROM, Strengthening, Both, 10 reps ? ?  ?General Comments   ?  ?  ? ?Pertinent Vitals/Pain Pain Assessment ?Pain Assessment: Faces ?Faces Pain Scale: Hurts even more ?Pain Location: stomach ?Pain Descriptors / Indicators: Guarding, Aching ?Pain Intervention(s): Limited activity within patient's tolerance, Monitored during session, Repositioned  ? ? ?Home Living   ?  ?  ?  ?  ?  ?  ?  ?  ?  ?   ?  ?Prior Function    ?  ?  ?   ? ?PT Goals (current goals can now be found in the care plan section) Acute Rehab PT Goals ?Patient Stated Goal: return home after rehab ?PT Goal Formulation: With patient ?Time For Goal Achievement: 04/08/21 ?Potential to Achieve Goals: Good ?Progress towards PT goals: Progressing toward goals ? ?  ?Frequency ? ? ? Min 3X/week ? ? ? ?  ?PT Plan Discharge plan needs to be updated  ? ? ?Co-evaluation   ?  ?  ?  ?  ? ?  ?AM-PAC PT "6 Clicks" Mobility   ?Outcome Measure ? Help needed turning from your back to your side while in a flat bed without using bedrails?: A Little ?Help needed moving from lying on your back to sitting on the side of a flat bed without using bedrails?: A Lot ?Help needed moving to and from a bed to a chair (including a wheelchair)?: A Lot ?Help needed standing up from a chair using your arms (e.g., wheelchair or bedside chair)?: A Lot ?Help needed to walk in hospital room?: A Lot ?Help needed climbing 3-5 steps with a railing? : Total ?6 Click Score: 12 ? ?  ?End of Session   ?Activity Tolerance: Patient tolerated treatment well;Patient limited by fatigue ?Patient left: in chair;with call bell/phone within reach;with chair alarm set ?Nurse Communication:  Mobility status ?PT Visit Diagnosis: Unsteadiness on feet (R26.81);Other abnormalities of gait and mobility (R26.89);Muscle weakness (generalized) (M62.81) ?  ? ? ?Time: 7893-8101 ?PT Time Calculation (min) (ACUTE ONLY): 22 min ? ?Charges:  $Therapeutic Exercise: 8-22 mins ?$Therapeutic Activity: 8-22 mins          ?          ? ?11:01 AM, 04/01/21 ?Lonell Grandchild, MPT ?Physical Therapist with [place] ?[facility] ?[phone] office ?[phone] mobile phone ? ? ?

## 2021-04-01 NOTE — Progress Notes (Signed)
? ?Progress Note ? ?Patient Name: [name] ?Date of Encounter: 04/01/2021 ? ?Primary Cardiologist: Rozann Lesches, MD ? ?Subjective  ? ?No chest pain or palpitations. ? ?Inpatient Medications  ?  ?Scheduled Meds: ? amiodarone  200 mg Oral BID  ? apixaban  5 mg Oral BID  ? ARIPiprazole  2 mg Oral Daily  ? atorvastatin  40 mg Oral Daily  ? Chlorhexidine Gluconate Cloth  6 each Topical Daily  ? diltiazem  120 mg Oral Daily  ? DULoxetine  120 mg Oral Daily  ? gabapentin  300 mg Oral TID  ? guaiFENesin  600 mg Oral BID  ? insulin aspart  0-9 Units Subcutaneous TID WC  ? insulin aspart  8 Units Subcutaneous TID WC  ? insulin glargine-yfgn  24 Units Subcutaneous QHS  ? ipratropium-albuterol  3 mL Nebulization BID  ? metoprolol tartrate  12.5 mg Oral Q8H  ? midodrine  10 mg Oral TID  ? mirabegron ER  25 mg Oral Daily  ? mirtazapine  30 mg Oral QHS  ? pantoprazole  40 mg Oral BID AC  ? risperiDONE  1 mg Oral BID  ? ?PRN Meds: ?acetaminophen **OR** acetaminophen, guaiFENesin-dextromethorphan, HYDROcodone-acetaminophen, hydrOXYzine, LORazepam, Muscle Rub, ondansetron **OR** ondansetron (ZOFRAN) IV  ? ?Vital Signs  ?  ?Vitals:  ? 04/01/21 0210 04/01/21 0505 04/01/21 3790 04/01/21 2409  ?BP: 138/82 130/85  (!) 135/92  ?Pulse: 99 94    ?Resp: 18 18    ?Temp: 97.8 ?F (36.6 ?C) 97.8 ?F (36.6 ?C)    ?TempSrc: Oral     ?SpO2: 98% 96% 97%   ?Weight:      ?Height:      ? ? ?Intake/Output Summary (Last 24 hours) at 04/01/2021 0953 ?Last data filed at 04/01/2021 0500 ?Gross per 24 hour  ?Intake 480 ml  ?Output 2450 ml  ?Net -1970 ml  ? ?Filed Weights  ? 03/25/21 0600 03/28/21 0404 03/31/21 0443  ?Weight: 69.3 kg 75.8 kg 67.5 kg  ? ? ?Telemetry  ?  ?Atrial fibrillation in the 90s.  Personally reviewed. ? ?ECG  ?  ?No ECG reviewed. ? ?Physical Exam  ? ?GEN: No acute distress.   ?Neck: No JVD. ?Cardiac: Irregularly irregular, 2/6 systolic murmur, no gallop.Marland Kitchen  ?Respiratory: Nonlabored.  Few scattered rhonchi without wheezing. ?GI:  Soft, nontender, bowel sounds present. ?MS: No pitting edema. ? ?Labs  ?  ?Chemistry ?Recent Labs  ?Lab 03/29/21 ?0430 03/31/21 ?0425 04/01/21 ?7353  ?NA 138 138 136  ?K 3.5 3.8 3.8  ?CL 97* 97* 95*  ?CO2 34* 34* 35*  ?GLUCOSE 168* 127* 189*  ?BUN 9 7* 10  ?CREATININE 0.50 0.58 0.61  ?CALCIUM 8.0* 8.1* 8.4*  ?GFRNONAA >60 >60 >60  ?ANIONGAP '7 7 6  '$ ?  ? ?Hematology ?Recent Labs  ?Lab 03/27/21 ?0356 03/29/21 ?0430 03/31/21 ?0425  ?WBC 12.6* 8.2 7.4  ?RBC 3.42* 3.36* 3.33*  ?HGB 9.9* 9.3* 9.4*  ?HCT 31.0* 29.7* 30.3*  ?MCV 90.6 88.4 91.0  ?MCH 28.9 27.7 28.2  ?MCHC 31.9 31.3 31.0  ?RDW 13.6 13.2 13.1  ?PLT 374 369 402*  ? ? ?Cardiac Enzymes ?Recent Labs  ?Lab 03/09/21 ?1346 03/09/21 ?1527  ?TROPONINIHS 7 7  ? ? ?BNP ?Recent Labs  ?Lab 03/27/21 ?2992  ?BNP 276.0*  ?  ? ?Radiology  ?  ?DG CHEST PORT 1 VIEW ? ?Result Date: 03/31/2021 ?CLINICAL DATA:  Shortness of breath, LEFT pleural effusion follow-up EXAM: PORTABLE CHEST 1 VIEW COMPARISON:  Portable exam 0742 hours  compared to 01/28/2021 FINDINGS: Rotated to the LEFT. RIGHT arm PICC line tip projects over RIGHT atrium. Enlargement of cardiac silhouette with slight vascular congestion. Mild pulmonary infiltrates which could represent edema or multifocal infection. More significant consolidation or atelectasis involving LEFT lower lobe. Small LEFT and tiny RIGHT pleural effusions. No pneumothorax. Atherosclerotic calcification aorta. Bones demineralized. IMPRESSION: Probable persistent mild pulmonary edema with small LEFT pleural effusion and persistent atelectasis versus consolidation of LEFT lower lobe. Aortic Atherosclerosis (ICD10-I70.0). Electronically Signed   By: Lavonia Dana M.D.   On: 03/31/2021 08:27   ? ? ?Assessment & Plan  ?  ?1.  Persistent atrial fibrillation with RVR with CHA2DS2-VASc score of 6.  Rate control complicated by pneumonia with hypoxic respiratory failure.  Also orthostatic hypotension.  She has tolerated conversion to oral rate control regimen  and is on Eliquis for stroke prophylaxis. ? ?2.  Known orthostatic hypotension, on midodrine.  Blood pressure has been stable. ? ?3.  Left parapneumonic effusion, pulmonary edema improved by last chest x-ray.  She has done well with intermittent IV Lasix. ? ?Continue amiodarone 200 mg twice daily for 7 days and then decrease to once daily.  Continue Eliquis for stroke prophylaxis.  Continue Cardizem CD 120 mg daily.  Convert from current dose of Lopressor to Toprol-XL 50 mg daily.  No change in midodrine.  Anticipate discharge today.  Suggest cardiology follow-up in the next 3 to 4 weeks. ? ?Signed, ?Rozann Lesches, MD  ?04/01/2021, 9:53 AM    ?

## 2021-04-04 ENCOUNTER — Other Ambulatory Visit: Payer: Self-pay | Admitting: Nurse Practitioner

## 2021-04-06 NOTE — Progress Notes (Deleted)
BH MD/PA/NP OP Progress Note ? ?04/06/2021 3:47 PM ?[name]  ?MRN:  [id number] ? ?Chief Complaint: No chief complaint on file. ? ?HPI:  ?- She was admitted due to Acute respiratory failure with hypoxia ([place]) Secondary to pneumonia and afib RVR ? ? ?Visit Diagnosis: No diagnosis found. ? ?Past Psychiatric History: *** ? ?Past Medical History:  ?Past Medical History:  ?Diagnosis Date  ? Anxiety   ? Arthritis   ? Atrial fibrillation ([place])   ? Atrial flutter ([place])   ? Collagen vascular disease ([place])   ? COVID-19 virus infection   ? COVID-19+ approx 01/24/19; asymptomatic course with full recovery  ? Dependence on wheelchair   ? pivot/transfers  ? Depression   ? History of psychosis and previous suicide attempt  ? DVT, lower extremity, recurrent ([place])   ? Long-term Coumadin per Dr. Legrand Rams  ? Essential hypertension   ? GERD (gastroesophageal reflux disease)   ? Hemiplegia ([place]) 2010  ? Left side  ? History of stroke   ? Acute infarct and right cerebral white matter small vessel disease 12/10  ? Leg DVT (deep venous thromboembolism), acute ([place]) 2006  ? Orthostatic hypotension   ? Schizophrenia ([place])   ? Stroke [facility])   ? left sided weakness  ? Type 2 diabetes mellitus ([place])   ?  ?Past Surgical History:  ?Procedure Laterality Date  ? BACK SURGERY    ? BIOPSY N/A 11/24/2014  ? Procedure: BIOPSY;  Surgeon: Danie Binder, MD;  Location: [facility];  Service: Endoscopy;  Laterality: N/A;  ? BIOPSY  05/17/2016  ? Procedure: BIOPSY;  Surgeon: Danie Binder, MD;  Location: [facility] SU[facility];  Service: Endoscopy;;  gastric biopsy  ? COLONOSCOPY WITH PROPOFOL N/A 11/24/2014  ? Dr. Rudie Meyer polyps removed/moderate sized internal hemorrhoids, tubular adenomas. Next surveillance in 3 years  ? ESOPHAGOGASTRODUODENOSCOPY (EGD) WITH PROPOFOL N/A 11/24/2014  ? Dr. Clayburn Pert HH/patent stricture at the gastroesophageal junction, mild non-erosive gastritis, path negative for H.pylori or celiac sprue  ? ESOPHAGOGASTRODUODENOSCOPY (EGD) WITH  PROPOFOL N/A 05/17/2016  ? Procedure: ESOPHAGOGASTRODUODENOSCOPY (EGD) WITH PROPOFOL;  Surgeon: Danie Binder, MD;  Location: [facility];  Service: Endoscopy;  Laterality: N/A;  12:45pm  ? GIVENS CAPSULE STUDY N/A 12/11/2014  ? MULTILPLE EROSION IN the stomach WITH ACTIVE OOZING. OCCASIONAL EROSIONS AND RARE ULCER SEEN IN PROXIMAL SMALL BOWEL . No masses or AVMs SEEN. NO OLD BLOOD OR FRESH BLOOD SEEN.   ? POLYPECTOMY N/A 11/24/2014  ? Procedure: POLYPECTOMY;  Surgeon: Danie Binder, MD;  Location: [facility];  Service: Endoscopy;  Laterality: N/A;  ? SAVORY DILATION N/A 05/17/2016  ? Procedure: SAVORY DILATION;  Surgeon: Danie Binder, MD;  Location: [facility];  Service: Endoscopy;  Laterality: N/A;  ? ? ?Family Psychiatric History: *** ? ?Family History:  ?Family History  ?Problem Relation Age of Onset  ? Hypertension Mother   ? Colon cancer Neg Hx   ? ? ?Social History:  ?Social History  ? ?Socioeconomic History  ? Marital status: Widowed  ?  Spouse name: Not on file  ? Number of children: Not on file  ? Years of education: Not on file  ? Highest education level: Not on file  ?Occupational History  ? Not on file  ?Tobacco Use  ? Smoking status: Former  ?  Packs/day: 0.25  ?  Years: 20.00  ?  Pack years: 5.00  ?  Types: Cigarettes  ?  Quit date: 01/24/1995  ?  Years  since quitting: 26.2  ? Smokeless tobacco: Never  ?Vaping Use  ? Vaping Use: Never used  ?Substance and Sexual Activity  ? Alcohol use: No  ?  Alcohol/week: 0.0 standard drinks  ? Drug use: No  ? Sexual activity: Never  ?  Birth control/protection: Post-menopausal  ?Other Topics Concern  ? Not on file  ?Social History Narrative  ? Not on file  ? ?Social Determinants of Health  ? ?Financial Resource Strain: Not on file  ?Food Insecurity: Not on file  ?Transportation Needs: Not on file  ?Physical Activity: Not on file  ?Stress: Not on file  ?Social Connections: Not on file  ? ? ?Allergies:  ?Allergies  ?Allergen Reactions  ? Sulfa Antibiotics Rash   ? ? ?Metabolic Disorder Labs: ?Lab Results  ?Component Value Date  ? HGBA1C 7.1 (H) 03/22/2021  ? MPG 157.07 03/22/2021  ? MPG 217.34 02/21/2020  ? ?No results found for: PROLACTIN ?Lab Results  ?Component Value Date  ? CHOL 107 12/02/2020  ? TRIG 82 12/02/2020  ? HDL 46 12/02/2020  ? CHOLHDL 2.3 12/02/2020  ? VLDL 30 10/05/2019  ? LDLCALC 45 12/02/2020  ? [place] 81 10/05/2019  ? ?Lab Results  ?Component Value Date  ? TSH 1.370 12/02/2020  ? TSH 0.869 06/03/2020  ? ? ?Therapeutic Level Labs: ?No results found for: LITHIUM ?No results found for: VALPROATE ?No components found for:  CBMZ ? ?Current Medications: ?Current Outpatient Medications  ?Medication Sig Dispense Refill  ? ABILIFY 2 MG tablet Take 1 tablet (2 mg total) by mouth daily. 30 tablet 5  ? acetaminophen (TYLENOL) 325 MG tablet Take 650 mg by mouth 3 (three) times daily as needed for mild pain or moderate pain.     ? amiodarone (PACERONE) 200 MG tablet Take 1 tablet (200 mg total) by mouth 2 (two) times daily. X 5 days, then one tablet (200 mg) daily thereafter    ? apixaban (ELIQUIS) 5 MG TABS tablet Take 1 tablet (5 mg total) by mouth 2 (two) times daily. 60 tablet 0  ? atorvastatin (LIPITOR) 40 MG tablet Take 1 tablet by mouth daily.    ? calcium carbonate (TUMS - DOSED IN MG ELEMENTAL CALCIUM) 500 MG chewable tablet Chew 1 tablet by mouth 4 (four) times daily as needed for heartburn.    ? Carboxymethylcellulose Sodium (ARTIFICIAL TEARS OP) Place 1 drop into both eyes 3 (three) times daily.    ? Cholecalciferol (VITAMIN D3) 50 MCG (2000 UT) TABS Take 2,000 Units by mouth daily.    ? clonazePAM (KLONOPIN) 0.5 MG tablet Take 1 tablet (0.5 mg total) by mouth 3 (three) times daily as needed for anxiety. 10 tablet 0  ? diltiazem (CARDIZEM CD) 120 MG 24 hr capsule Take 120 mg by mouth daily.    ? DULoxetine (CYMBALTA) 60 MG capsule Take 120 mg by mouth daily.    ? gabapentin (NEURONTIN) 300 MG capsule Take 1 capsule (300 mg total) by mouth 3 (three) times  daily as needed. (Patient taking differently: Take 300 mg by mouth 3 (three) times daily.)    ? HYDROcodone-acetaminophen (NORCO) 10-325 MG tablet Take 1 tablet by mouth 3 (three) times daily as needed for severe pain. 10 tablet 0  ? hydrOXYzine (ATARAX/VISTARIL) 25 MG tablet Take 1 tablet (25 mg total) by mouth every 8 (eight) hours as needed for anxiety or itching. (Patient taking differently: Take 25 mg by mouth 3 (three) times daily.) 30 tablet 0  ? insulin detemir (LEVEMIR FLEXTOUCH) 100  UNIT/ML FlexPen Inject 55 Units into the skin at bedtime. 45 mL 3  ? latanoprost (XALATAN) 0.005 % ophthalmic solution Place 1 drop into both eyes at bedtime.    ? loratadine (CLARITIN) 10 MG tablet Take 10 mg by mouth daily.    ? metoprolol succinate (TOPROL-XL) 50 MG 24 hr tablet Take 1 tablet (50 mg total) by mouth daily. Take with or immediately following a meal.    ? midodrine (PROAMATINE) 10 MG tablet Take 10 mg by mouth 3 (three) times daily.    ? mirabegron ER (MYRBETRIQ) 25 MG TB24 tablet Take 25 mg by mouth daily.    ? mirtazapine (REMERON) 30 MG tablet Take 30 mg by mouth at bedtime.    ? NOVOLOG FLEXPEN 100 UNIT/ML FlexPen INJECT SUBCUTANEOUSLY AS FOLLOWS WITH MEALS: 90-150=10u: 151-200=11u: 201-250=12u: 251-300=13u: 301-350=14u: 351-400=15u: BS>400=16u & CALL MD. 15 mL 0  ? Nutritional Supplements (ENSURE COMPLETE PO) Take by mouth daily.    ? ondansetron (ZOFRAN) 4 MG tablet Take 1 tablet (4 mg total) by mouth 2 (two) times daily as needed for nausea or vomiting. (Patient taking differently: Take 4 mg by mouth in the morning, at noon, in the evening, and at bedtime.)    ? pantoprazole (PROTONIX) 40 MG tablet Take 1 tablet (40 mg total) by mouth 2 (two) times daily before a meal. 180 tablet 1  ? Plecanatide (TRULANCE) 3 MG TABS Take 3 mg by mouth daily. 30 tablet 5  ? polyethylene glycol (MIRALAX / GLYCOLAX) 17 g packet Take 17 g by mouth daily. 14 each 0  ? risperiDONE (RISPERDAL) 2 MG tablet Take 0.5 tablets (1  mg total) by mouth daily AND 1 tablet (2 mg total) at bedtime. 45 tablet 2  ? senna (SENOKOT) 8.6 MG TABS tablet Take 1 tablet (8.6 mg total) by mouth daily. 120 tablet 0  ? TRADJENTA 5 MG TABS tablet TAKE (1) TA

## 2021-04-08 ENCOUNTER — Telehealth: Payer: Self-pay | Admitting: Psychiatry

## 2021-04-08 ENCOUNTER — Encounter: Payer: Medicare Other | Admitting: Psychiatry

## 2021-04-08 NOTE — Telephone Encounter (Signed)
Sent link for video visit through [place]. Patient did not sign in. Called the patient for appointment scheduled today. [place], the staff reports that the patient is in rehab. She verbalized understanding to contact the office after she is back to the group home.  ?

## 2021-04-08 NOTE — Progress Notes (Signed)
This encounter was created in error - please disregard.

## 2021-04-11 ENCOUNTER — Ambulatory Visit: Payer: Medicare Other | Admitting: Gastroenterology

## 2021-04-12 ENCOUNTER — Ambulatory Visit: Payer: Medicare Other | Admitting: Nurse Practitioner

## 2021-04-12 ENCOUNTER — Encounter: Payer: Self-pay | Admitting: Nurse Practitioner

## 2021-04-12 ENCOUNTER — Other Ambulatory Visit: Payer: Self-pay

## 2021-04-12 ENCOUNTER — Ambulatory Visit (INDEPENDENT_AMBULATORY_CARE_PROVIDER_SITE_OTHER): Payer: Medicare Other | Admitting: Nurse Practitioner

## 2021-04-12 VITALS — BP 101/62 | HR 96

## 2021-04-12 DIAGNOSIS — I10 Essential (primary) hypertension: Secondary | ICD-10-CM | POA: Diagnosis not present

## 2021-04-12 DIAGNOSIS — E1159 Type 2 diabetes mellitus with other circulatory complications: Secondary | ICD-10-CM

## 2021-04-12 DIAGNOSIS — E782 Mixed hyperlipidemia: Secondary | ICD-10-CM | POA: Diagnosis not present

## 2021-04-12 NOTE — Progress Notes (Signed)
04/12/2021 ? ?Endocrinology follow-up note ? ?  ? ? ?Subjective:  ? ? Patient ID: [name], female    DOB: 07/25/1945,  ? ? ?Past Medical History:  ?Diagnosis Date  ? Anxiety   ? Arthritis   ? Atrial fibrillation ([place])   ? Atrial flutter ([place])   ? Collagen vascular disease ([place])   ? COVID-19 virus infection   ? COVID-19+ approx 01/24/19; asymptomatic course with full recovery  ? Dependence on wheelchair   ? pivot/transfers  ? Depression   ? History of psychosis and previous suicide attempt  ? DVT, lower extremity, recurrent ([place])   ? Long-term Coumadin per Dr. Legrand Rams  ? Essential hypertension   ? GERD (gastroesophageal reflux disease)   ? Hemiplegia ([place]) 2010  ? Left side  ? History of stroke   ? Acute infarct and right cerebral white matter small vessel disease 12/10  ? Leg DVT (deep venous thromboembolism), acute ([place]) 2006  ? Orthostatic hypotension   ? Schizophrenia ([place])   ? Stroke [facility])   ? left sided weakness  ? Type 2 diabetes mellitus ([place])   ? ?Past Surgical History:  ?Procedure Laterality Date  ? BACK SURGERY    ? BIOPSY N/A 11/24/2014  ? Procedure: BIOPSY;  Surgeon: Danie Binder, MD;  Location: [facility];  Service: Endoscopy;  Laterality: N/A;  ? BIOPSY  05/17/2016  ? Procedure: BIOPSY;  Surgeon: Danie Binder, MD;  Location[facility];  Service: Endoscopy;;  gastric biopsy  ? COLONOSCOPY WITH PROPOFOL N/A 11/24/2014  ? Dr. Rudie Meyer polyps removed/moderate sized internal hemorrhoids, tubular adenomas. Next surveillance in 3 years  ? ESOPHAGOGASTRODUODENOSCOPY (EGD) WITH PROPOFOL N/A 11/24/2014  ? Dr. Clayburn Pert HH/patent stricture at the gastroesophageal junction, mild non-erosive gastritis, path negative for H.pylori or celiac sprue  ? ESOPHAGOGASTRODUODENOSCOPY (EGD) WITH PROPOFOL N/A 05/17/2016  ? Procedure: ESOPHAGOGASTRODUODENOSCOPY (EGD) WITH PROPOFOL;  Surgeon: Danie Binder, MD;  [facility];  Service: Endoscopy;  Laterality: N/A;  12:45pm  ? GIVENS CAPSULE STUDY N/A 12/11/2014   ? MULTILPLE EROSION IN the stomach WITH ACTIVE OOZING. OCCASIONAL EROSIONS AND RARE ULCER SEEN IN PROXIMAL SMALL BOWEL . No masses or AVMs SEEN. NO OLD BLOOD OR FRESH BLOOD SEEN.   ? POLYPECTOMY N/A 11/24/2014  ? Procedure: POLYPECTOMY;  Surgeon: Danie Binder, MD;  Location: [facility];  Service: Endoscopy;  Laterality: N/A;  ? SAVORY DILATION N/A 05/17/2016  ? Procedure: SAVORY DILATION;  Surgeon: Danie Binder, MD;  [facility];  Service: Endoscopy;  Laterality: N/A;  ? ?Family History  ?Problem Relation Age of Onset  ? Hypertension Mother   ? Colon cancer Neg Hx   ? ? ?Social History  ? ?Socioeconomic History  ? Marital status: Widowed  ?  Spouse name: Not on file  ? Number of children: Not on file  ? Years of education: Not on file  ? Highest education level: Not on file  ?Occupational History  ? Not on file  ?Tobacco Use  ? Smoking status: Former  ?  Packs/day: 0.25  ?  Years: 20.00  ?  Pack years: 5.00  ?  Types: Cigarettes  ?  Quit date: 01/24/1995  ?  Years since quitting: 26.2  ? Smokeless tobacco: Never  ?Vaping Use  ? Vaping Use: Never used  ?Substance and Sexual Activity  ? Alcohol use: No  ?  Alcohol/week: 0.0 standard drinks  ? Drug use: No  ? Sexual activity: Never  ?  Birth control/protection: Post-menopausal  ?Other Topics Concern  ? Not on file  ?Social History Narrative  ? Not on file  ? ?Social Determinants of Health  ? ?Financial Resource Strain: Not on file  ?Food Insecurity: Not on file  ?Transportation Needs: Not on file  ?Physical Activity: Not on file  ?Stress: Not on file  ?Social Connections: Not on file  ? ?Outpatient Encounter Medications as of 04/12/2021  ?Medication Sig  ? ABILIFY 2 MG tablet Take 1 tablet (2 mg total) by mouth daily.  ? acetaminophen (TYLENOL) 325 MG tablet Take 650 mg by mouth 3 (three) times daily as needed for mild pain or moderate pain.   ? amiodarone (PACERONE) 200 MG tablet Take 1 tablet (200 mg total) by mouth 2 (two) times daily. X 5 days, then one  tablet (200 mg) daily thereafter  ? amLODipine (NORVASC) 5 MG tablet Take 5 mg by mouth daily.  ? apixaban (ELIQUIS) 5 MG TABS tablet Take 1 tablet (5 mg total) by mouth 2 (two) times daily.  ? atorvastatin (LIPITOR) 40 MG tablet Take 1 tablet by mouth daily.  ? calcium carbonate (TUMS - DOSED IN MG ELEMENTAL CALCIUM) 500 MG chewable tablet Chew 1 tablet by mouth 4 (four) times daily as needed for heartburn.  ? Carboxymethylcellulose Sodium (ARTIFICIAL TEARS OP) Place 1 drop into both eyes 3 (three) times daily.  ? Cholecalciferol (VITAMIN D3) 50 MCG (2000 UT) TABS Take 2,000 Units by mouth daily.  ? clonazePAM (KLONOPIN) 0.5 MG tablet Take 1 tablet (0.5 mg total) by mouth 3 (three) times daily as needed for anxiety.  ? diltiazem (CARDIZEM CD) 120 MG 24 hr capsule Take 120 mg by mouth daily.  ? DULoxetine (CYMBALTA) 60 MG capsule Take 120 mg by mouth daily.  ? gabapentin (NEURONTIN) 300 MG capsule Take 1 capsule (300 mg total) by mouth 3 (three) times daily as needed. (Patient taking differently: Take 300 mg by mouth 3 (three) times daily.)  ? HYDROcodone-acetaminophen (NORCO) 10-325 MG tablet Take 1 tablet by mouth 3 (three) times daily as needed for severe pain.  ? hydrOXYzine (ATARAX/VISTARIL) 25 MG tablet Take 1 tablet (25 mg total) by mouth every 8 (eight) hours as needed for anxiety or itching. (Patient taking differently: Take 25 mg by mouth 3 (three) times daily.)  ? insulin detemir (LEVEMIR FLEXTOUCH) 100 UNIT/ML FlexPen Inject 55 Units into the skin at bedtime.  ? latanoprost (XALATAN) 0.005 % ophthalmic solution Place 1 drop into both eyes at bedtime.  ? loratadine (CLARITIN) 10 MG tablet Take 10 mg by mouth daily.  ? metoprolol succinate (TOPROL-XL) 50 MG 24 hr tablet Take 1 tablet (50 mg total) by mouth daily. Take with or immediately following a meal.  ? midodrine (PROAMATINE) 10 MG tablet Take 10 mg by mouth 3 (three) times daily.  ? mirabegron ER (MYRBETRIQ) 25 MG TB24 tablet Take 25 mg by mouth  daily.  ? mirtazapine (REMERON) 30 MG tablet Take 30 mg by mouth at bedtime.  ? NOVOLOG FLEXPEN 100 UNIT/ML FlexPen INJECT SUBCUTANEOUSLY AS FOLLOWS WITH MEALS: 90-150=10u: 151-200=11u: 201-250=12u: 251-300=13u: 301-350=14u: 351-400=15u: BS>400=16u & CALL MD.  ? Nutritional Supplements (ENSURE COMPLETE PO) Take by mouth daily.  ? ondansetron (ZOFRAN) 4 MG tablet Take 1 tablet (4 mg total) by mouth 2 (two) times daily as needed for nausea or vomiting. (Patient taking differently: Take 4 mg by mouth in the morning, at noon, in the evening, and at bedtime.)  ? pantoprazole (PROTONIX) 40 MG tablet Take 1  tablet (40 mg total) by mouth 2 (two) times daily before a meal.  ? Plecanatide (TRULANCE) 3 MG TABS Take 3 mg by mouth daily.  ? polyethylene glycol (MIRALAX / GLYCOLAX) 17 g packet Take 17 g by mouth daily.  ? risperiDONE (RISPERDAL) 1 MG tablet Take by mouth.  ? senna (SENOKOT) 8.6 MG TABS tablet Take 1 tablet (8.6 mg total) by mouth daily.  ? TRADJENTA 5 MG TABS tablet TAKE (1) TABLET BY MOUTH ONCE DAILY.  ? zolpidem (AMBIEN) 5 MG tablet Take 1 tablet (5 mg total) by mouth at bedtime as needed.  ? [DISCONTINUED] Alogliptin Benzoate 25 MG TABS Take 25 mg by mouth daily.  ? risperiDONE (RISPERDAL) 2 MG tablet Take 0.5 tablets (1 mg total) by mouth daily AND 1 tablet (2 mg total) at bedtime. (Patient not taking: Reported on 04/12/2021)  ? ?No facility-administered encounter medications on file as of 04/12/2021.  ? ?ALLERGIES: ?Allergies  ?Allergen Reactions  ? Sulfa Antibiotics Rash  ? ?VACCINATION STATUS: ?Immunization History  ?Administered Date(s) Administered  ? Moderna Covid-19 Vaccine Bivalent Booster 51yr & up 03/31/2021  ? ? ?Diabetes ?She presents for her follow-up diabetic visit. She has type 2 diabetes mellitus. Onset time: She was diagnosed at approximate age of 89years. Her disease course has been stable. There are no hypoglycemic associated symptoms. Pertinent negatives for hypoglycemia include no  confusion, pallor or seizures. Associated symptoms include fatigue. Pertinent negatives for diabetes include no polydipsia, no polyphagia, no polyuria and no weight loss. There are no hypoglycemic complications. Sym

## 2021-04-23 ENCOUNTER — Other Ambulatory Visit: Payer: Self-pay

## 2021-04-23 ENCOUNTER — Observation Stay (HOSPITAL_COMMUNITY)
Admission: EM | Admit: 2021-04-23 | Discharge: 2021-04-25 | Disposition: A | Discharge status: Skilled Nursing Facility | Payer: Medicare Other | Attending: Internal Medicine | Admitting: Internal Medicine

## 2021-04-23 ENCOUNTER — Emergency Department (HOSPITAL_COMMUNITY): Payer: Medicare Other

## 2021-04-23 ENCOUNTER — Encounter (HOSPITAL_COMMUNITY): Payer: Self-pay | Admitting: Emergency Medicine

## 2021-04-23 DIAGNOSIS — K219 Gastro-esophageal reflux disease without esophagitis: Secondary | ICD-10-CM

## 2021-04-23 DIAGNOSIS — Z8616 Personal history of COVID-19: Secondary | ICD-10-CM | POA: Diagnosis not present

## 2021-04-23 DIAGNOSIS — Z86718 Personal history of other venous thrombosis and embolism: Secondary | ICD-10-CM | POA: Diagnosis not present

## 2021-04-23 DIAGNOSIS — E1165 Type 2 diabetes mellitus with hyperglycemia: Secondary | ICD-10-CM

## 2021-04-23 DIAGNOSIS — Z87891 Personal history of nicotine dependence: Secondary | ICD-10-CM | POA: Diagnosis not present

## 2021-04-23 DIAGNOSIS — I82409 Acute embolism and thrombosis of unspecified deep veins of unspecified lower extremity: Secondary | ICD-10-CM | POA: Diagnosis not present

## 2021-04-23 DIAGNOSIS — Z79899 Other long term (current) drug therapy: Secondary | ICD-10-CM | POA: Insufficient documentation

## 2021-04-23 DIAGNOSIS — K5641 Fecal impaction: Principal | ICD-10-CM

## 2021-04-23 DIAGNOSIS — I10 Essential (primary) hypertension: Secondary | ICD-10-CM

## 2021-04-23 DIAGNOSIS — Z8673 Personal history of transient ischemic attack (TIA), and cerebral infarction without residual deficits: Secondary | ICD-10-CM | POA: Diagnosis not present

## 2021-04-23 DIAGNOSIS — Z794 Long term (current) use of insulin: Secondary | ICD-10-CM

## 2021-04-23 DIAGNOSIS — R1030 Lower abdominal pain, unspecified: Secondary | ICD-10-CM | POA: Diagnosis present

## 2021-04-23 DIAGNOSIS — I480 Paroxysmal atrial fibrillation: Secondary | ICD-10-CM | POA: Diagnosis not present

## 2021-04-23 DIAGNOSIS — I6930 Unspecified sequelae of cerebral infarction: Secondary | ICD-10-CM

## 2021-04-23 DIAGNOSIS — Z7984 Long term (current) use of oral hypoglycemic drugs: Secondary | ICD-10-CM | POA: Diagnosis not present

## 2021-04-23 DIAGNOSIS — E119 Type 2 diabetes mellitus without complications: Secondary | ICD-10-CM

## 2021-04-23 DIAGNOSIS — I951 Orthostatic hypotension: Secondary | ICD-10-CM

## 2021-04-23 DIAGNOSIS — Z7901 Long term (current) use of anticoagulants: Secondary | ICD-10-CM | POA: Diagnosis not present

## 2021-04-23 DIAGNOSIS — E782 Mixed hyperlipidemia: Secondary | ICD-10-CM

## 2021-04-23 LAB — CBC WITH DIFFERENTIAL/PLATELET
Abs Immature Granulocytes: 0.03 10*3/uL (ref 0.00–0.07)
Basophils Absolute: 0 10*3/uL (ref 0.0–0.1)
Basophils Relative: 0 %
Eosinophils Absolute: 0 10*3/uL (ref 0.0–0.5)
Eosinophils Relative: 0 %
HCT: 42 % (ref 36.0–46.0)
Hemoglobin: 13.4 g/dL (ref 12.0–15.0)
Immature Granulocytes: 0 %
Lymphocytes Relative: 13 %
Lymphs Abs: 0.9 10*3/uL (ref 0.7–4.0)
MCH: 28.3 pg (ref 26.0–34.0)
MCHC: 31.9 g/dL (ref 30.0–36.0)
MCV: 88.6 fL (ref 80.0–100.0)
Monocytes Absolute: 0.3 10*3/uL (ref 0.1–1.0)
Monocytes Relative: 5 %
Neutro Abs: 5.8 10*3/uL (ref 1.7–7.7)
Neutrophils Relative %: 82 %
Platelets: 234 10*3/uL (ref 150–400)
RBC: 4.74 MIL/uL (ref 3.87–5.11)
RDW: 14.5 % (ref 11.5–15.5)
WBC: 7.1 10*3/uL (ref 4.0–10.5)
nRBC: 0 % (ref 0.0–0.2)

## 2021-04-23 LAB — COMPREHENSIVE METABOLIC PANEL
ALT: 16 U/L (ref 0–44)
AST: 19 U/L (ref 15–41)
Albumin: 4.2 g/dL (ref 3.5–5.0)
Alkaline Phosphatase: 114 U/L (ref 38–126)
Anion gap: 9 (ref 5–15)
BUN: 15 mg/dL (ref 8–23)
CO2: 27 mmol/L (ref 22–32)
Calcium: 8.9 mg/dL (ref 8.9–10.3)
Chloride: 99 mmol/L (ref 98–111)
Creatinine, Ser: 0.74 mg/dL (ref 0.44–1.00)
GFR, Estimated: >60 mL/min (ref >60)
Glucose, Bld: 223 mg/dL — ABNORMAL HIGH (ref 70–99)
Potassium: 4.2 mmol/L (ref 3.5–5.1)
Sodium: 135 mmol/L (ref 135–145)
Total Bilirubin: 0.3 mg/dL (ref 0.3–1.2)
Total Protein: 8 g/dL (ref 6.5–8.1)

## 2021-04-23 LAB — TSH: TSH: 1.076 u[IU]/mL (ref 0.350–4.500)

## 2021-04-23 LAB — GLUCOSE, CAPILLARY: Glucose-Capillary: 145 mg/dL — ABNORMAL HIGH (ref 70–99)

## 2021-04-23 LAB — LIPASE, BLOOD: Lipase: 25 U/L (ref 11–51)

## 2021-04-23 LAB — CBG MONITORING, ED: Glucose-Capillary: 153 mg/dL — ABNORMAL HIGH (ref 70–99)

## 2021-04-23 MED ORDER — TEMAZEPAM 7.5 MG PO CAPS
7.5000 mg | ORAL_CAPSULE | Freq: Every evening | ORAL | Status: DC | PRN
  Administered 2021-04-23 – 2021-04-24 (×2): 7.5 mg via ORAL
  Filled 2021-04-23 (×2): qty 1

## 2021-04-23 MED ORDER — DILTIAZEM HCL ER COATED BEADS 120 MG PO CP24
120.0000 mg | ORAL_CAPSULE | Freq: Every day | ORAL | Status: DC
  Administered 2021-04-24: 120 mg via ORAL
  Filled 2021-04-23: qty 1

## 2021-04-23 MED ORDER — ATORVASTATIN CALCIUM 40 MG PO TABS
40.0000 mg | ORAL_TABLET | Freq: Every day | ORAL | Status: DC
Start: 2021-04-23 — End: 2021-04-25
  Administered 2021-04-23 – 2021-04-24 (×2): 40 mg via ORAL
  Filled 2021-04-23 (×2): qty 1

## 2021-04-23 MED ORDER — INSULIN ASPART 100 UNIT/ML IJ SOLN: 0.0000 [IU] | Freq: Every day | INTRAMUSCULAR | Status: DC

## 2021-04-23 MED ORDER — ONDANSETRON HCL 4 MG/2ML IJ SOLN: 4.0000 mg | Freq: Four times a day (QID) | INTRAMUSCULAR | Status: DC | PRN

## 2021-04-23 MED ORDER — LATANOPROST 0.005 % OP SOLN
1.0000 [drp] | Freq: Every day | OPHTHALMIC | Status: DC
  Administered 2021-04-23 – 2021-04-24 (×2): 1 [drp] via OPHTHALMIC
  Filled 2021-04-23 (×2): qty 2.5

## 2021-04-23 MED ORDER — MIDODRINE HCL 5 MG PO TABS
10.0000 mg | ORAL_TABLET | Freq: Three times a day (TID) | ORAL | Status: DC
  Administered 2021-04-23 – 2021-04-24 (×4): 10 mg via ORAL
  Filled 2021-04-23 (×3): qty 2

## 2021-04-23 MED ORDER — MIRTAZAPINE 30 MG PO TABS
30.0000 mg | ORAL_TABLET | Freq: Every day | ORAL | Status: DC
Start: 2021-04-23 — End: 2021-04-25
  Administered 2021-04-23 – 2021-04-24 (×2): 30 mg via ORAL
  Filled 2021-04-23 (×2): qty 1

## 2021-04-23 MED ORDER — NALOXEGOL OXALATE 25 MG PO TABS
25.0000 mg | ORAL_TABLET | Freq: Every day | ORAL | Status: DC
Start: 2021-04-23 — End: 2021-04-23

## 2021-04-23 MED ORDER — INSULIN GLARGINE-YFGN 100 UNIT/ML ~~LOC~~ SOLN
22.0000 [IU] | Freq: Every day | SUBCUTANEOUS | Status: DC
  Administered 2021-04-23 – 2021-04-24 (×2): 22 [IU] via SUBCUTANEOUS
  Filled 2021-04-23 (×2): qty 0.22

## 2021-04-23 MED ORDER — METOPROLOL SUCCINATE ER 50 MG PO TB24
50.0000 mg | ORAL_TABLET | Freq: Every day | ORAL | Status: DC
  Administered 2021-04-24: 50 mg via ORAL
  Filled 2021-04-23: qty 1

## 2021-04-23 MED ORDER — PEG 3350-KCL-NA BICARB-NACL 420 G PO SOLR
4000.0000 mL | Freq: Once | ORAL | Status: AC
  Administered 2021-04-23: 4000 mL via ORAL

## 2021-04-23 MED ORDER — ONDANSETRON HCL 4 MG PO TABS: 4.0000 mg | ORAL_TABLET | Freq: Four times a day (QID) | ORAL | Status: DC | PRN

## 2021-04-23 MED ORDER — POLYVINYL ALCOHOL 1.4 % OP SOLN
2.0000 [drp] | Freq: Three times a day (TID) | OPHTHALMIC | Status: DC
Start: 2021-04-23 — End: 2021-04-25
  Administered 2021-04-23 – 2021-04-24 (×5): 2 [drp] via OPHTHALMIC
  Filled 2021-04-23 (×3): qty 15

## 2021-04-23 MED ORDER — NALOXEGOL OXALATE 12.5 MG PO TABS
12.5000 mg | ORAL_TABLET | Freq: Every day | ORAL | Status: DC
  Administered 2021-04-24: 12.5 mg via ORAL
  Filled 2021-04-23 (×2): qty 1

## 2021-04-23 MED ORDER — IOHEXOL 300 MG/ML  SOLN
100.0000 mL | Freq: Once | INTRAMUSCULAR | Status: AC | PRN
  Administered 2021-04-23: 100 mL via INTRAVENOUS

## 2021-04-23 MED ORDER — AMIODARONE HCL 200 MG PO TABS
200.0000 mg | ORAL_TABLET | Freq: Every day | ORAL | Status: DC
  Administered 2021-04-24: 200 mg via ORAL
  Filled 2021-04-23: qty 1

## 2021-04-23 MED ORDER — DULOXETINE HCL 60 MG PO CPEP
120.0000 mg | ORAL_CAPSULE | Freq: Every day | ORAL | Status: DC
  Administered 2021-04-24: 120 mg via ORAL
  Filled 2021-04-23 (×2): qty 2

## 2021-04-23 MED ORDER — FENTANYL CITRATE PF 50 MCG/ML IJ SOSY
50.0000 ug | PREFILLED_SYRINGE | Freq: Once | INTRAMUSCULAR | Status: AC
  Administered 2021-04-23: 50 ug via INTRAVENOUS
  Filled 2021-04-23: qty 1

## 2021-04-23 MED ORDER — HYDROCODONE-ACETAMINOPHEN 10-325 MG PO TABS
1.0000 | ORAL_TABLET | Freq: Three times a day (TID) | ORAL | Status: DC | PRN
  Administered 2021-04-23 – 2021-04-24 (×3): 1 via ORAL
  Filled 2021-04-23 (×3): qty 1

## 2021-04-23 MED ORDER — RISPERIDONE 1 MG PO TABS
1.0000 mg | ORAL_TABLET | Freq: Every day | ORAL | Status: DC
  Administered 2021-04-24: 1 mg via ORAL
  Filled 2021-04-23: qty 1

## 2021-04-23 MED ORDER — RISPERIDONE 1 MG PO TABS
2.0000 mg | ORAL_TABLET | Freq: Every day | ORAL | Status: DC
  Administered 2021-04-23: 2 mg via ORAL
  Filled 2021-04-23: qty 2

## 2021-04-23 MED ORDER — SODIUM CHLORIDE 0.9 % IV SOLN
INTRAVENOUS | Status: AC
Start: 2021-04-23 — End: 2021-04-24

## 2021-04-23 MED ORDER — ACETAMINOPHEN 650 MG RE SUPP: 650.0000 mg | Freq: Four times a day (QID) | RECTAL | Status: DC | PRN

## 2021-04-23 MED ORDER — PANTOPRAZOLE SODIUM 40 MG PO TBEC
40.0000 mg | DELAYED_RELEASE_TABLET | Freq: Two times a day (BID) | ORAL | Status: DC
Start: 2021-04-23 — End: 2021-04-25
  Administered 2021-04-23 – 2021-04-24 (×3): 40 mg via ORAL
  Filled 2021-04-23 (×3): qty 1

## 2021-04-23 MED ORDER — ACETAMINOPHEN 500 MG PO TABS
1000.0000 mg | ORAL_TABLET | Freq: Once | ORAL | Status: AC
  Administered 2021-04-23: 1000 mg via ORAL
  Filled 2021-04-23: qty 2

## 2021-04-23 MED ORDER — ARIPIPRAZOLE 2 MG PO TABS
2.0000 mg | ORAL_TABLET | Freq: Every day | ORAL | Status: DC
  Administered 2021-04-23 – 2021-04-24 (×2): 2 mg via ORAL
  Filled 2021-04-23 (×3): qty 1

## 2021-04-23 MED ORDER — APIXABAN 5 MG PO TABS
5.0000 mg | ORAL_TABLET | Freq: Two times a day (BID) | ORAL | Status: DC
  Administered 2021-04-23 – 2021-04-24 (×3): 5 mg via ORAL
  Filled 2021-04-23 (×3): qty 1

## 2021-04-23 MED ORDER — INSULIN ASPART 100 UNIT/ML IJ SOLN
0.0000 [IU] | Freq: Three times a day (TID) | INTRAMUSCULAR | Status: DC
  Administered 2021-04-23 – 2021-04-24 (×3): 3 [IU] via SUBCUTANEOUS
  Administered 2021-04-24: 2 [IU] via SUBCUTANEOUS
  Filled 2021-04-23: qty 1

## 2021-04-23 MED ORDER — ACETAMINOPHEN 325 MG PO TABS
650.0000 mg | ORAL_TABLET | Freq: Four times a day (QID) | ORAL | Status: DC | PRN
  Administered 2021-04-24 (×2): 650 mg via ORAL
  Filled 2021-04-23 (×2): qty 2

## 2021-04-23 MED ORDER — SORBITOL 70 % SOLN
960.0000 mL | TOPICAL_OIL | Freq: Once | ORAL | Status: AC
  Administered 2021-04-23: 960 mL via RECTAL
  Filled 2021-04-23: qty 473

## 2021-04-23 MED ORDER — GABAPENTIN 300 MG PO CAPS
300.0000 mg | ORAL_CAPSULE | Freq: Three times a day (TID) | ORAL | Status: DC | PRN
Start: 2021-04-23 — End: 2021-04-25
  Administered 2021-04-24 (×2): 300 mg via ORAL
  Filled 2021-04-23 (×2): qty 1

## 2021-04-23 MED ORDER — FLEET ENEMA 7-19 GM/118ML RE ENEM
1.0000 | ENEMA | Freq: Once | RECTAL | Status: AC
  Administered 2021-04-23: 1 via RECTAL

## 2021-04-23 MED ORDER — POLYETHYLENE GLYCOL 3350 17 GM/SCOOP PO POWD
1.0000 | Freq: Once | ORAL | Status: DC
  Filled 2021-04-23: qty 255

## 2021-04-23 MED ORDER — RISPERIDONE 1 MG PO TABS: 1.0000 mg | ORAL_TABLET | Freq: Two times a day (BID) | ORAL | Status: DC

## 2021-04-23 NOTE — ED Triage Notes (Signed)
Patient brought in via EMS. Airway patent. Alert, confusion-normal status per facility. Patient c/o abd pain, per facility patient has constipation 2-3 days. No medications from facility given in attempt to relieve constipation. Denies any nausea, vomiting, or fever. Patient denies any urinary symptoms.  ?

## 2021-04-23 NOTE — Assessment & Plan Note (Addendum)
-  Continue PPI and the use of as needed Tums.Marland Kitchen ?

## 2021-04-23 NOTE — Assessment & Plan Note (Addendum)
-  Recent A1c 7.1 ?-Continue to follow modified carbohydrate diet ?-Resume home hypoglycemic regimen. ?

## 2021-04-23 NOTE — Assessment & Plan Note (Addendum)
-  No complaints of new Focal deficits ?-Continue risk factor modifications and the use of Eliquis for secondary prevention. ?

## 2021-04-23 NOTE — ED Notes (Signed)
Pt incontinent of urine. Pt changed ?

## 2021-04-23 NOTE — Assessment & Plan Note (Addendum)
-  Patient vital signs are stable ?-Will continue the use of midodrine ?

## 2021-04-23 NOTE — Assessment & Plan Note (Addendum)
-  Currently rate controlled ?-Electrolytes have remained stable ?-Patient denies palpitations. ?-Continue the use of metoprolol and amiodarone ?-Continue Eliquis. ?

## 2021-04-23 NOTE — Assessment & Plan Note (Signed)
-  Continue chronic anticoagulation therapy. ?-Patient reports no lower extremity pain currently. ?

## 2021-04-23 NOTE — H&P (Signed)
?History and Physical  ? ? ?Patient: [name] GYF:[id number] DOB: [birth year] ?DOA: 04/23/2021 ?DOS: the patient was seen and examined on 04/23/2021 ?PCP: Carrolyn Meiers, MD  ?Patient coming from: SNF ? ?Chief Complaint:  ?Chief Complaint  ?Patient presents with  ? Abdominal Pain  ? ?HPI: [name] is a 76 y.o. female with medical history significant of anxiety/depression, paroxysmal atrial fibrillation, hypertension, orthostatic hypotension, prior history of stroke with left-sided residual deficits; type 2 diabetes mellitus, hyperlipidemia, history of recurrent lower extremity DVTs, chronic opiates usage and GERD; who presented from her skilled nursing facility secondary to abdominal pain.  Patient's symptoms have been present for the last 4 to 5 days and worsening.  Patient reports roughly last bowel movement was over 4 days ago and according to her not a good bowel movement at least for the last 11 days.  Reports no chest pain, no nausea, no vomiting, no shortness of breath, no dysuria, hematuria, melena, new focal deficits, fever, sick contacts or any other complaints. ? ?While in ED work-up demonstrated a CT abdomen and pelvis with significant stool burden/buildup and fecal impaction; there was also chronic degenerative changes appreciated in her lumbar spine.  Patient with normal lipase level and overall blood work unremarkable except for hyperglycemia.  ? ?Disimpaction and enema attempted with just mild stool passage.  Patient continued to be symptomatic and complaining of abdominal pain.  Gastroenterology service was curbside with recommendations for bowel preparation under clinical supervision and initiation of bowel regimen.  [facility] was contacted to place in the hospital for further management. ? ? ?Review of Systems: As mentioned in the history of present illness. All other systems reviewed and are negative. ?Past Medical History:  ?Diagnosis Date  ? Anxiety   ? Arthritis   ? Atrial  fibrillation ([place])   ? Atrial flutter ([place])   ? Collagen vascular disease ([place])   ? COVID-19 virus infection   ? COVID-19+ approx 01/24/19; asymptomatic course with full recovery  ? Dependence on wheelchair   ? pivot/transfers  ? Depression   ? History of psychosis and previous suicide attempt  ? DVT, lower extremity, recurrent ([place])   ? Long-term Coumadin per Dr. Legrand Rams  ? Essential hypertension   ? GERD (gastroesophageal reflux disease)   ? Hemiplegia ([place]) 2010  ? Left side  ? History of stroke   ? Acute infarct and right cerebral white matter small vessel disease 12/10  ? Leg DVT (deep venous thromboembolism), acute ([place]) 2006  ? Orthostatic hypotension   ? Schizophrenia ([place])   ? Stroke [facility])   ? left sided weakness  ? Type 2 diabetes mellitus ([place])   ? ?Past Surgical History:  ?Procedure Laterality Date  ? BACK SURGERY    ? BIOPSY N/A 11/24/2014  ? Procedure: BIOPSY;  Surgeon: Danie Binder, MD;  Location: [facility];  Service: Endoscopy;  Laterality: N/A;  ? BIOPSY  05/17/2016  ? Procedure: BIOPSY;  Surgeon: Danie Binder, MD;  [facility];  Service: Endoscopy;;  gastric biopsy  ? COLONOSCOPY WITH PROPOFOL N/A 11/24/2014  ? Dr. Rudie Meyer polyps removed/moderate sized internal hemorrhoids, tubular adenomas. Next surveillance in 3 years  ? ESOPHAGOGASTRODUODENOSCOPY (EGD) WITH PROPOFOL N/A 11/24/2014  ? Dr. Clayburn Pert HH/patent stricture at the gastroesophageal junction, mild non-erosive gastritis, path negative for H.pylori or celiac sprue  ? ESOPHAGOGASTRODUODENOSCOPY (EGD) WITH PROPOFOL N/A 05/17/2016  ? Procedure: ESOPHAGOGASTRODUODENOSCOPY (EGD) WITH PROPOFOL;  Surgeon: Danie Binder, MD;  [facility];  Service: Endoscopy;  Laterality: N/A;  12:45pm  ? GIVENS CAPSULE STUDY N/A 12/11/2014  ? MULTILPLE EROSION IN the stomach WITH ACTIVE OOZING. OCCASIONAL EROSIONS AND RARE ULCER SEEN IN PROXIMAL SMALL BOWEL . No masses or AVMs SEEN. NO OLD BLOOD OR FRESH BLOOD SEEN.   ? POLYPECTOMY N/A  11/24/2014  ? Procedure: POLYPECTOMY;  Surgeon: Danie Binder, MD;  Location: [facility];  Service: Endoscopy;  Laterality: N/A;  ? SAVORY DILATION N/A 05/17/2016  ? Procedure: SAVORY DILATION;  Surgeon: Danie Binder, MD;  [facility];  Service: Endoscopy;  Laterality: N/A;  ? ?Social History:  reports that she quit smoking about 26 years ago. Her smoking use included cigarettes. She has a 5.00 pack-year smoking history. She has never used smokeless tobacco. She reports that she does not drink alcohol and does not use drugs. ? ?Allergies  ?Allergen Reactions  ? Sulfa Antibiotics Rash  ? ? ?Family History  ?Problem Relation Age of Onset  ? Hypertension Mother   ? Colon cancer Neg Hx   ? ? ?Prior to Admission medications   ?Medication Sig Start Date End Date Taking? Authorizing Provider  ?ABILIFY 2 MG tablet Take 1 tablet (2 mg total) by mouth daily. ?Patient taking differently: Take 2 mg by mouth at bedtime. 11/09/20 05/08/21 Yes [name], MD  ?acetaminophen (TYLENOL) 325 MG tablet Take 650 mg by mouth 3 (three) times daily as needed for mild pain or moderate pain.    Yes [provider]  ?amiodarone (PACERONE) 200 MG tablet Take 1 tablet (200 mg total) by mouth 2 (two) times daily. X 5 days, then one tablet (200 mg) daily thereafter ?Patient taking differently: Take 200 mg by mouth daily. 04/01/21  Yes Tat, Shanon Brow, MD  ?apixaban (ELIQUIS) 5 MG TABS tablet Take 1 tablet (5 mg total) by mouth 2 (two) times daily. 02/21/20  Yes Richarda Osmond, MD  ?atorvastatin (LIPITOR) 40 MG tablet Take 1 tablet by mouth at bedtime. 05/30/20  Yes [provider]  ?calcium carbonate (TUMS - DOSED IN MG ELEMENTAL CALCIUM) 500 MG chewable tablet Chew 1 tablet by mouth 4 (four) times daily as needed for heartburn.   Yes [provider]  ?Carboxymethylcellulose Sodium (ARTIFICIAL TEARS OP) Place 1 drop into both eyes 3 (three) times daily.   Yes [provider]  ?Cholecalciferol (VITAMIN  D3) 50 MCG (2000 UT) TABS Take 2,000 Units by mouth daily.   Yes [provider]  ?clonazePAM (KLONOPIN) 0.5 MG tablet Take 1 tablet (0.5 mg total) by mouth 3 (three) times daily as needed for anxiety. 04/01/21  Yes Tat, Shanon Brow, MD  ?diltiazem (CARDIZEM CD) 120 MG 24 hr capsule Take 120 mg by mouth daily. 05/30/20  Yes [provider]  ?DULoxetine (CYMBALTA) 60 MG capsule Take 120 mg by mouth daily.   Yes [provider]  ?gabapentin (NEURONTIN) 300 MG capsule Take 1 capsule (300 mg total) by mouth 3 (three) times daily as needed. ?Patient taking differently: Take 300 mg by mouth every 8 (eight) hours as needed (pain). 02/21/20  Yes Richarda Osmond, MD  ?HYDROcodone-acetaminophen (NORCO) 10-325 MG tablet Take 1 tablet by mouth 3 (three) times daily as needed for severe pain. 04/01/21  Yes Tat, Shanon Brow, MD  ?insulin detemir (LEVEMIR FLEXTOUCH) 100 UNIT/ML FlexPen Inject 55 Units into the skin at bedtime. 09/08/20  Yes Brita Romp, NP  ?latanoprost (XALATAN) 0.005 % ophthalmic solution Place 1 drop into both eyes at bedtime.   Yes [provider]  ?loratadine (CLARITIN) 10 MG tablet Take 10 mg by mouth daily.   Yes [provider]  ?metoprolol succinate (TOPROL-XL) 50 MG 24 hr tablet Take 1 tablet (50 mg total) by mouth daily. Take with or immediately following a meal. 04/01/21  Yes Tat, David, MD  ?midodrine (PROAMATINE) 10 MG tablet Take 10 mg by mouth 3 (three) times daily.   Yes [provider]  ?mirtazapine (REMERON) 30 MG tablet Take 30 mg by mouth at bedtime.   Yes [provider]  ?naloxegol oxalate (MOVANTIK) 25 MG TABS tablet Take by mouth daily.   Yes [provider]  ?NOVOLOG FLEXPEN 100 UNIT/ML FlexPen INJECT SUBCUTANEOUSLY AS FOLLOWS WITH MEALS: 90-150=10u: 151-200=11u: 201-250=12u: 251-300=13u: 301-350=14u: 351-400=15u: BS>400=16u & CALL MD. ?Patient taking differently: 10-16 Units 3 (three) times daily with meals. INJECT  SUBCUTANEOUSLY AS FOLLOWS WITH MEALS: 90-150=10u: 151-200=11u: 201-250=12u: 251-300=13u: 301-350=14u: 351-400=15u: BS>400=16u & CALL MD. 02/28/21  Yes Brita Romp, NP  ?ondansetron (ZOFRAN) 4 MG tablet Take 1 tablet (4 mg

## 2021-04-23 NOTE — Assessment & Plan Note (Addendum)
-  In the setting of chronic opiate usage, decrease fluids intake and no proper use of bowel regimen at her living facility. ?-CT scan demonstrating extensive buildup stool throughout her entire colon and fecal impaction. ?-Disimpaction and enema provided with successful bowel movements. ?-Following GI service recommendation patient also receive bowel preparation overnight has continue assisting with multiple stools. ?-Patient reports no significant abdominal discomfort currently; there is no nausea, no vomiting and has tolerated oral intake. ?-Bowel regimen has been initiated using Movantik, as needed MiraLAX and Dulcolax. ?-Electrolytes within normal limits. ?-Advised to follow-up with PCP in 1 week. ?-Maintain adequate hydration. ?

## 2021-04-23 NOTE — ED Notes (Signed)
Pt had large BM had it on her cloths,hands, arms,face and bedding . [place] 3 staff members to get pt cleaned up.  ?

## 2021-04-23 NOTE — Assessment & Plan Note (Addendum)
-  Overall stable ?-Continue home antihypertensive agents and follow vital signs. ?-Heart healthy diet instructed. ?

## 2021-04-23 NOTE — ED Provider Notes (Signed)
?[place] ?Provider Note ? ? ?CSN: [id number] ?Arrival date & time: 04/23/21  1004 ? ?  ? ?History ? ?Chief Complaint  ?Patient presents with  ? Abdominal Pain  ? ? ?[name] is a 76 y.o. female. ? ? ?Abdominal Pain ? ?Patient is a 76 year old female status post stroke with left-sided hemiplegia, atrial fibrillation on Eliquis, type 2 diabetes presenting today due to lower abdominal pain.  Patient states she has been feeling constipated for 11 days, her last bowel movement was 4 days ago.  She states she is passing gas.  The pain is all over her abdomen, worse in the lower side.  It is constant, nothing makes it better or worse.  No nausea or vomiting, no fevers, no prior abdominal surgeries. ? ?I called Kirin the nurse at her home.  She states that the patient was complaining of abdominal pain for the last 2 days.  She gets MiraLAX daily, unsure if patient has been having bowel movements.  States her mental status is roughly unchanged. ? ?Home Medications ?Prior to Admission medications   ?Medication Sig Start Date End Date Taking? Authorizing Provider  ?ABILIFY 2 MG tablet Take 1 tablet (2 mg total) by mouth daily. ?Patient taking differently: Take 2 mg by mouth at bedtime. 11/09/20 05/08/21 Yes [name], MD  ?acetaminophen (TYLENOL) 325 MG tablet Take 650 mg by mouth 3 (three) times daily as needed for mild pain or moderate pain.    Yes [provider]  ?amiodarone (PACERONE) 200 MG tablet Take 1 tablet (200 mg total) by mouth 2 (two) times daily. X 5 days, then one tablet (200 mg) daily thereafter ?Patient taking differently: Take 200 mg by mouth daily. 04/01/21  Yes Tat, Shanon Brow, MD  ?apixaban (ELIQUIS) 5 MG TABS tablet Take 1 tablet (5 mg total) by mouth 2 (two) times daily. 02/21/20  Yes Richarda Osmond, MD  ?atorvastatin (LIPITOR) 40 MG tablet Take 1 tablet by mouth at bedtime. 05/30/20  Yes [provider]  ?calcium carbonate (TUMS - DOSED IN MG ELEMENTAL  CALCIUM) 500 MG chewable tablet Chew 1 tablet by mouth 4 (four) times daily as needed for heartburn.   Yes [provider]  ?Carboxymethylcellulose Sodium (ARTIFICIAL TEARS OP) Place 1 drop into both eyes 3 (three) times daily.   Yes [provider]  ?Cholecalciferol (VITAMIN D3) 50 MCG (2000 UT) TABS Take 2,000 Units by mouth daily.   Yes [provider]  ?clonazePAM (KLONOPIN) 0.5 MG tablet Take 1 tablet (0.5 mg total) by mouth 3 (three) times daily as needed for anxiety. 04/01/21  Yes Tat, Shanon Brow, MD  ?diltiazem (CARDIZEM CD) 120 MG 24 hr capsule Take 120 mg by mouth daily. 05/30/20  Yes [provider]  ?DULoxetine (CYMBALTA) 60 MG capsule Take 120 mg by mouth daily.   Yes [provider]  ?gabapentin (NEURONTIN) 300 MG capsule Take 1 capsule (300 mg total) by mouth 3 (three) times daily as needed. ?Patient taking differently: Take 300 mg by mouth every 8 (eight) hours as needed (pain). 02/21/20  Yes Richarda Osmond, MD  ?HYDROcodone-acetaminophen (NORCO) 10-325 MG tablet Take 1 tablet by mouth 3 (three) times daily as needed for severe pain. 04/01/21  Yes Tat, Shanon Brow, MD  ?insulin detemir (LEVEMIR FLEXTOUCH) 100 UNIT/ML FlexPen Inject 55 Units into the skin at bedtime. 09/08/20  Yes Brita Romp, NP  ?latanoprost (XALATAN) 0.005 % ophthalmic solution Place 1 drop into both eyes at bedtime.   Yes [provider]  ?loratadine (CLARITIN) 10 MG tablet Take 10 mg by mouth daily.   Yes [provider]  ?metoprolol succinate (TOPROL-XL) 50 MG 24 hr tablet Take 1 tablet (50 mg total) by mouth daily. Take with or immediately following a meal. 04/01/21  Yes Tat, David, MD  ?midodrine (PROAMATINE) 10 MG tablet Take 10 mg by mouth 3 (three) times daily.   Yes [provider]  ?mirtazapine (REMERON) 30 MG tablet Take 30 mg by mouth at bedtime.   Yes [provider]  ?naloxegol oxalate (MOVANTIK) 25 MG TABS tablet Take by mouth daily.   Yes  [provider]  ?NOVOLOG FLEXPEN 100 UNIT/ML FlexPen INJECT SUBCUTANEOUSLY AS FOLLOWS WITH MEALS: 90-150=10u: 151-200=11u: 201-250=12u: 251-300=13u: 301-350=14u: 351-400=15u: BS>400=16u & CALL MD. ?Patient taking differently: 10-16 Units 3 (three) times daily with meals. INJECT SUBCUTANEOUSLY AS FOLLOWS WITH MEALS: 90-150=10u: 151-200=11u: 201-250=12u: 251-300=13u: 301-350=14u: 351-400=15u: BS>400=16u & CALL MD. 02/28/21  Yes Brita Romp, NP  ?ondansetron (ZOFRAN) 4 MG tablet Take 1 tablet (4 mg total) by mouth 2 (two) times daily as needed for nausea or vomiting. ?Patient taking differently: Take 4 mg by mouth every 12 (twelve) hours as needed for nausea or vomiting. 04/26/20  Yes Johnson, Clanford L, MD  ?oxybutynin (DITROPAN-XL) 5 MG 24 hr tablet Take 5 mg by mouth daily. 04/20/21  Yes [provider]  ?pantoprazole (PROTONIX) 40 MG tablet Take 1 tablet (40 mg total) by mouth 2 (two) times daily before a meal. 11/17/20  Yes Mahala Menghini, PA-C  ?polyethylene glycol (MIRALAX / GLYCOLAX) 17 g packet Take 17 g by mouth daily. ?Patient taking differently: Take 17 g by mouth daily as needed for mild constipation. 02/22/20  Yes Richarda Osmond, MD  ?risperiDONE (RISPERDAL) 1 MG tablet Take 1-2 mg by mouth 2 (two) times daily. Take 1 tablet by mouth in the morning. Take 2 tablets by mouth at bedtime. 04/04/21  Yes [provider]  ?senna (SENOKOT) 8.6 MG TABS tablet Take 1 tablet (8.6 mg total) by mouth daily. 02/22/20  Yes Richarda Osmond, MD  ?TRADJENTA 5 MG TABS tablet TAKE (1) TABLET BY MOUTH ONCE DAILY. ?Patient taking differently: Take 5 mg by mouth daily. 04/04/21  Yes Brita Romp, NP  ?hydrOXYzine (ATARAX/VISTARIL) 25 MG tablet Take 1 tablet (25 mg total) by mouth every 8 (eight) hours as needed for anxiety or itching. ?Patient not taking: Reported on 04/23/2021 04/26/20   Murlean Iba, MD  ?risperiDONE (RISPERDAL) 2 MG tablet Take 0.5 tablets (1 mg total) by mouth  daily AND 1 tablet (2 mg total) at bedtime. ?Patient not taking: Reported on 04/12/2021 02/25/21 05/26/21  [name], MD  ?zolpidem (AMBIEN) 5 MG tablet Take 1 tablet (5 mg total) by mouth at bedtime as needed. ?Patient not taking: Reported on 04/23/2021 04/01/21   Orson Eva, MD  ?   ? ?Allergies    ?Sulfa antibiotics   ? ?Review of Systems   ?Review of Systems  ?Gastrointestinal:  Positive for abdominal pain.  ? ?Physical Exam ?Updated Vital Signs ?BP 108/70   Pulse 88   Temp (!) 97.5 ?F (36.4 ?C) (Oral)   Resp 17   Ht '5\' 7"'$  (1.702 m)   Wt 63 kg   SpO2 100%   BMI 21.77 kg/m?  ?Physical Exam ?Vitals and nursing note reviewed. Exam conducted with a chaperone present.  ?Constitutional:   ?   Appearance: Normal appearance.  ?HENT:  ?   Head: Normocephalic and atraumatic.  ?  Eyes:  ?   General: No scleral icterus.    ?   Right eye: No discharge.     ?   Left eye: No discharge.  ?   Extraocular Movements: Extraocular movements intact.  ?   Pupils: Pupils are equal, round, and reactive to light.  ?Cardiovascular:  ?   Rate and Rhythm: Normal rate. Rhythm irregular.  ?   Pulses: Normal pulses.  ?   Heart sounds: Normal heart sounds. No murmur heard. ?  No friction rub. No gallop.  ?Pulmonary:  ?   Effort: Pulmonary effort is normal. No respiratory distress.  ?   Breath sounds: Normal breath sounds.  ?Abdominal:  ?   General: Abdomen is flat. Bowel sounds are normal. There is no distension.  ?   Palpations: Abdomen is soft.  ?   Tenderness: There is generalized abdominal tenderness.  ?   Comments: Abdomen is soft, generalized tenderness worse in the lower quadrants bilaterally.  ?Skin: ?   General: Skin is warm and dry.  ?   Coloration: Skin is not jaundiced.  ?Neurological:  ?   Mental Status: She is alert. Mental status is at baseline.  ?   Coordination: Coordination normal.  ? ? ?ED Results / Procedures / Treatments   ?Labs ?(all labs ordered are listed, but only abnormal results are displayed) ?Labs Reviewed   ?COMPREHENSIVE METABOLIC PANEL - Abnormal; Notable for the following components:  ?    Result Value  ? Glucose, Bld 223 (*)   ? All other components within normal limits  ?CBC WITH DIFFERENTIAL/PLATELET  ?LIPASE, BLOOD  ?

## 2021-04-23 NOTE — Assessment & Plan Note (Addendum)
-  Continue Lipitor and heart healthy diet. ?

## 2021-04-23 NOTE — Plan of Care (Signed)

## 2021-04-24 DIAGNOSIS — K5903 Drug induced constipation: Secondary | ICD-10-CM

## 2021-04-24 DIAGNOSIS — K5641 Fecal impaction: Secondary | ICD-10-CM | POA: Diagnosis not present

## 2021-04-24 LAB — CBC
HCT: 38.5 % (ref 36.0–46.0)
Hemoglobin: 12.1 g/dL (ref 12.0–15.0)
MCH: 28.3 pg (ref 26.0–34.0)
MCHC: 31.4 g/dL (ref 30.0–36.0)
MCV: 90.2 fL (ref 80.0–100.0)
Platelets: 202 10*3/uL (ref 150–400)
RBC: 4.27 MIL/uL (ref 3.87–5.11)
RDW: 14.7 % (ref 11.5–15.5)
WBC: 7.4 10*3/uL (ref 4.0–10.5)
nRBC: 0 % (ref 0.0–0.2)

## 2021-04-24 LAB — BASIC METABOLIC PANEL
Anion gap: 5 (ref 5–15)
BUN: 11 mg/dL (ref 8–23)
CO2: 28 mmol/L (ref 22–32)
Calcium: 8.2 mg/dL — ABNORMAL LOW (ref 8.9–10.3)
Chloride: 105 mmol/L (ref 98–111)
Creatinine, Ser: 0.71 mg/dL (ref 0.44–1.00)
GFR, Estimated: >60 mL/min (ref >60)
Glucose, Bld: 114 mg/dL — ABNORMAL HIGH (ref 70–99)
Potassium: 4.3 mmol/L (ref 3.5–5.1)
Sodium: 138 mmol/L (ref 135–145)

## 2021-04-24 LAB — GLUCOSE, CAPILLARY
Glucose-Capillary: 125 mg/dL — ABNORMAL HIGH (ref 70–99)
Glucose-Capillary: 186 mg/dL — ABNORMAL HIGH (ref 70–99)
Glucose-Capillary: 160 mg/dL — ABNORMAL HIGH (ref 70–99)
Glucose-Capillary: 143 mg/dL — ABNORMAL HIGH (ref 70–99)

## 2021-04-24 MED ORDER — BISACODYL 5 MG PO TBEC: 5.0000 mg | DELAYED_RELEASE_TABLET | Freq: Every day | ORAL | 1 refills | Status: DC | PRN

## 2021-04-24 MED ORDER — POLYETHYLENE GLYCOL 3350 17 G PO PACK: 17.0000 g | PACK | Freq: Every day | ORAL | 0 refills | Status: AC

## 2021-04-24 MED ORDER — NALOXEGOL OXALATE 12.5 MG PO TABS: 12.5000 mg | ORAL_TABLET | Freq: Every day | ORAL | 1 refills | Status: AC

## 2021-04-24 MED ORDER — AMIODARONE HCL 200 MG PO TABS: 200.0000 mg | ORAL_TABLET | Freq: Every day | ORAL | Status: DC

## 2021-04-24 NOTE — Discharge Summary (Signed)
?Physician Discharge Summary ?  ?Patient: [name] MRN: [id number] DOB: 07/25/1945  ?Admit date:     04/23/2021  ?Discharge date: 04/24/21  ?Discharge Physician: Barton Dubois  ? ?PCP: Carrolyn Meiers, MD  ? ?Recommendations at discharge:  ?Repeat basic metabolic panel to follow ultralights renal function ?Continue to follow response to current bowel regimen further adjust medications as needed to continue assisting constipation. ?Make sure patient has follow-up ? ?Discharge Diagnoses: ?Principal Problem: ?  Fecal impaction ([place]) ?Active Problems: ?  Uncontrolled type 2 diabetes mellitus with hyperglycemia, with long-term current use of insulin ([place]) ?  Essential hypertension, benign ?  DVT, lower extremity, recurrent ([place]) ?  Mixed hyperlipidemia ?  History of cerebrovascular accident (CVA) with residual deficit ?  Orthostatic hypotension ?  GERD (gastroesophageal reflux disease) ?  Paroxysmal atrial fibrillation ([facility]) ? ? ? ?Hospital Course: ?[name] is a 76 y.o. female with medical history significant of anxiety/depression, paroxysmal atrial fibrillation, hypertension, orthostatic hypotension, prior history of stroke with left-sided residual deficits; type 2 diabetes mellitus, hyperlipidemia, history of recurrent lower extremity DVTs, chronic opiates usage and GERD; who presented from her skilled nursing facility secondary to abdominal pain.  Patient's symptoms have been present for the last 4 to 5 days and worsening.  Patient reports roughly last bowel movement was over 4 days ago and according to her not a good bowel movement at least for the last 11 days.  Reports no chest pain, no nausea, no vomiting, no shortness of breath, no dysuria, hematuria, melena, new focal deficits, fever, sick contacts or any other complaints. ?  ?While in ED work-up demonstrated a CT abdomen and pelvis with significant stool burden/buildup and fecal impaction; there was also chronic degenerative changes  appreciated in her lumbar spine.  Patient with normal lipase level and overall blood work unremarkable except for hyperglycemia.  ?  ?Disimpaction and enema attempted with just mild stool passage.  Patient continued to be symptomatic and complaining of abdominal pain.  Gastroenterology service was curbside with recommendations for bowel preparation under clinical supervision and initiation of bowel regimen.  [facility] was contacted to place in the hospital for further management. ? ?Assessment and Plan: ?* Fecal impaction ([place]) ?-In the setting of chronic opiate usage, decrease fluids intake and no proper use of bowel regimen at her living facility. ?-CT scan demonstrating extensive buildup stool throughout her entire colon and fecal impaction. ?-Disimpaction and enema provided with successful bowel movements. ?-Following GI service recommendation patient also receive bowel preparation overnight has continue assisting with multiple stools. ?-Patient reports no significant abdominal discomfort currently; there is no nausea, no vomiting and has tolerated oral intake. ?-Bowel regimen has been initiated using Movantik, as needed MiraLAX and Dulcolax. ?-Electrolytes within normal limits. ?-Advised to follow-up with PCP in 1 week. ?-Maintain adequate hydration. ? ?Paroxysmal atrial fibrillation ([place]) ?-Currently rate controlled ?-Electrolytes have remained stable ?-Patient denies palpitations. ?-Continue the use of metoprolol and amiodarone ?-Continue Eliquis. ? ?GERD (gastroesophageal reflux disease) ?-Continue PPI and the use of as needed Tums.. ? ?Orthostatic hypotension ?-Patient vital signs are stable ?-Will continue the use of midodrine ? ?History of cerebrovascular accident (CVA) with residual deficit ?-No complaints of new Focal deficits ?-Continue risk factor modifications and the use of Eliquis for secondary prevention. ? ?Mixed hyperlipidemia ?-Continue Lipitor and heart healthy diet. ? ?DVT, lower extremity,  recurrent ([place]) ?-Continue chronic anticoagulation therapy. ?-Patient reports no lower extremity pain currently. ? ?Essential hypertension, benign ?-Overall stable ?-Continue home antihypertensive agents and  follow vital signs. ?-Heart healthy diet instructed. ? ?Uncontrolled type 2 diabetes mellitus with hyperglycemia, with long-term current use of insulin ([place]) ?-Recent A1c 7.1 ?-Continue to follow modified carbohydrate diet ?-Resume home hypoglycemic regimen. ? ?*The rest of her medical problems remained stable during hospitalization and the plan is to continue home medication regimen.  Patient to follow-up with PCP to further adjust her meds as needed. ? ?Consultants: Gastroenterology service curbside. ?Procedures performed: See below for x-ray reports. ?Disposition: Discharge back to skilled nursing facility. ? ?Diet recommendation:  ?Cardiac and Carb modified diet ? ?DISCHARGE MEDICATION: ?Allergies as of 04/24/2021   ? ?   Reactions  ? Sulfa Antibiotics Rash  ? ?  ? ?  ?Medication List  ?  ? ?STOP taking these medications   ? ?senna 8.6 MG Tabs tablet ?Commonly known as: SENOKOT ?  ? ?  ? ?TAKE these medications   ? ?Abilify 2 MG tablet ?Generic drug: ARIPiprazole ?Take 1 tablet (2 mg total) by mouth daily. ?What changed: when to take this ?  ?acetaminophen 325 MG tablet ?Commonly known as: TYLENOL ?Take 650 mg by mouth 3 (three) times daily as needed for mild pain or moderate pain. ?  ?amiodarone 200 MG tablet ?Commonly known as: PACERONE ?Take 1 tablet (200 mg total) by mouth daily. ?  ?apixaban 5 MG Tabs tablet ?Commonly known as: ELIQUIS ?Take 1 tablet (5 mg total) by mouth 2 (two) times daily. ?  ?ARTIFICIAL TEARS OP ?Place 1 drop into both eyes 3 (three) times daily. ?  ?atorvastatin 40 MG tablet ?Commonly known as: LIPITOR ?Take 1 tablet by mouth at bedtime. ?  ?bisacodyl 5 MG EC tablet ?Commonly known as: Dulcolax ?Take 1 tablet (5 mg total) by mouth daily as needed for moderate constipation or mild  constipation. ?  ?calcium carbonate 500 MG chewable tablet ?Commonly known as: TUMS - dosed in mg elemental calcium ?Chew 1 tablet by mouth 4 (four) times daily as needed for heartburn. ?  ?clonazePAM 0.5 MG tablet ?Commonly known as: KLONOPIN ?Take 1 tablet (0.5 mg total) by mouth 3 (three) times daily as needed for anxiety. ?  ?diltiazem 120 MG 24 hr capsule ?Commonly known as: CARDIZEM CD ?Take 120 mg by mouth daily. ?  ?DULoxetine 60 MG capsule ?Commonly known as: CYMBALTA ?Take 120 mg by mouth daily. ?  ?gabapentin 300 MG capsule ?Commonly known as: NEURONTIN ?Take 1 capsule (300 mg total) by mouth 3 (three) times daily as needed. ?What changed:  ?when to take this ?reasons to take this ?  ?HYDROcodone-acetaminophen 10-325 MG tablet ?Commonly known as: NORCO ?Take 1 tablet by mouth 3 (three) times daily as needed for severe pain. ?  ?latanoprost 0.005 % ophthalmic solution ?Commonly known as: XALATAN ?Place 1 drop into both eyes at bedtime. ?  ?Levemir FlexTouch 100 UNIT/ML FlexPen ?Generic drug: insulin detemir ?Inject 55 Units into the skin at bedtime. ?  ?loratadine 10 MG tablet ?Commonly known as: CLARITIN ?Take 10 mg by mouth daily. ?  ?metoprolol succinate 50 MG 24 hr tablet ?Commonly known as: TOPROL-XL ?Take 1 tablet (50 mg total) by mouth daily. Take with or immediately following a meal. ?  ?midodrine 10 MG tablet ?Commonly known as: PROAMATINE ?Take 10 mg by mouth 3 (three) times daily. ?  ?mirtazapine 30 MG tablet ?Commonly known as: REMERON ?Take 30 mg by mouth at bedtime. ?  ?naloxegol oxalate 12.5 MG Tabs tablet ?Commonly known as: MOVANTIK ?Take 1 tablet (12.5 mg total) by mouth daily. ?What  changed:  ?medication strength ?how much to take ?  ?NovoLOG FlexPen 100 UNIT/ML FlexPen ?Generic drug: insulin aspart ?INJECT SUBCUTANEOUSLY AS FOLLOWS WITH MEALS: 90-150=10u: 151-200=11u: 201-250=12u: 251-300=13u: 301-350=14u: 351-400=15u: BS>400=16u & CALL MD. ?What changed: See the new instructions. ?   ?ondansetron 4 MG tablet ?Commonly known as: ZOFRAN ?Take 1 tablet (4 mg total) by mouth 2 (two) times daily as needed for nausea or vomiting. ?What changed: when to take this ?  ?oxybutynin 5 MG 24 hr tablet ?Commonly k

## 2021-04-24 NOTE — Progress Notes (Signed)
Report has been given to [facility].  ?

## 2021-04-24 NOTE — Progress Notes (Signed)
Patient is returning to [facility]. RN has called report. CSW called for transport for pickup as first available. ? ?Madilyn Fireman, MSW, LCSW ?Transitions of Care  Clinical Social Worker II ?[phone] ? ?

## 2021-04-25 DIAGNOSIS — K5641 Fecal impaction: Secondary | ICD-10-CM | POA: Diagnosis not present

## 2021-04-25 NOTE — Progress Notes (Signed)
[facility] called and will receive patient at this time.  Patient left floor in stable condition.  ?

## 2021-04-26 DIAGNOSIS — J189 Pneumonia, unspecified organism: Secondary | ICD-10-CM

## 2021-05-02 NOTE — Progress Notes (Deleted)
BH MD/PA/NP OP Progress Note ? ?05/02/2021 10:57 AM ?[name]  ?MRN:  [id number] ? ?Chief Complaint: No chief complaint on file. ? ?HPI:  ?- She was admitted due to Acute respiratory failure with hypoxia ([place]) Secondary to pneumonia and afib RVR ?- She was sent to ED from ALF for fecal impaction/abdominal pain ? ?Visit Diagnosis: No diagnosis found. ? ?Past Psychiatric History: Please see initial evaluation for full details. I have reviewed the history. No updates at this time.  ?  ? ?Past Medical History:  ?Past Medical History:  ?Diagnosis Date  ? Anxiety   ? Arthritis   ? Atrial fibrillation ([place])   ? Atrial flutter ([place])   ? Collagen vascular disease ([place])   ? COVID-19 virus infection   ? COVID-19+ approx 01/24/19; asymptomatic course with full recovery  ? Dependence on wheelchair   ? pivot/transfers  ? Depression   ? History of psychosis and previous suicide attempt  ? DVT, lower extremity, recurrent ([place])   ? Long-term Coumadin per Dr. Legrand Rams  ? Essential hypertension   ? GERD (gastroesophageal reflux disease)   ? Hemiplegia ([place]) 2010  ? Left side  ? History of stroke   ? Acute infarct and right cerebral white matter small vessel disease 12/10  ? Leg DVT (deep venous thromboembolism), acute ([place]) 2006  ? Orthostatic hypotension   ? Schizophrenia ([place])   ? Stroke [facility])   ? left sided weakness  ? Type 2 diabetes mellitus ([place])   ?  ?Past Surgical History:  ?Procedure Laterality Date  ? BACK SURGERY    ? BIOPSY N/A 11/24/2014  ? Procedure: BIOPSY;  Surgeon: Danie Binder, MD;  Location: [facility];  Service: Endoscopy;  Laterality: N/A;  ? BIOPSY  05/17/2016  ? Procedure: BIOPSY;  Surgeon: Danie Binder, MD;  Location: [facility] END[facility] SU[facility];  Service: Endoscopy;;  gastric biopsy  ? COLONOSCOPY WITH PROPOFOL N/A 11/24/2014  ? Dr. Rudie Meyer polyps removed/moderate sized internal hemorrhoids, tubular adenomas. Next surveillance in 3 years  ? ESOPHAGOGASTRODUODENOSCOPY (EGD) WITH PROPOFOL N/A 11/24/2014  ? Dr. Clayburn Pert  HH/patent stricture at the gastroesophageal junction, mild non-erosive gastritis, path negative for H.pylori or celiac sprue  ? ESOPHAGOGASTRODUODENOSCOPY (EGD) WITH PROPOFOL N/A 05/17/2016  ? Procedure: ESOPHAGOGASTRODUODENOSCOPY (EGD) WITH PROPOFOL;  Surgeon: Danie Binder, MD;  Location: [facility];  Service: Endoscopy;  Laterality: N/A;  12:45pm  ? GIVENS CAPSULE STUDY N/A 12/11/2014  ? MULTILPLE EROSION IN the stomach WITH ACTIVE OOZING. OCCASIONAL EROSIONS AND RARE ULCER SEEN IN PROXIMAL SMALL BOWEL . No masses or AVMs SEEN. NO OLD BLOOD OR FRESH BLOOD SEEN.   ? POLYPECTOMY N/A 11/24/2014  ? Procedure: POLYPECTOMY;  Surgeon: Danie Binder, MD;  Location: [facility];  Service: Endoscopy;  Laterality: N/A;  ? SAVORY DILATION N/A 05/17/2016  ? Procedure: SAVORY DILATION;  Surgeon: Danie Binder, MD;  Location: [facility];  Service: Endoscopy;  Laterality: N/A;  ? ? ?Family Psychiatric History: Please see initial evaluation for full details. I have reviewed the history. No updates at this time.  ?  ? ?Family History:  ?Family History  ?Problem Relation Age of Onset  ? Hypertension Mother   ? Colon cancer Neg Hx   ? ? ?Social History:  ?Social History  ? ?Socioeconomic History  ? Marital status: Widowed  ?  Spouse name: Not on file  ? Number of children: Not on file  ? Years of education: Not on file  ? Highest education level: Not  on file  ?Occupational History  ? Not on file  ?Tobacco Use  ? Smoking status: Former  ?  Packs/day: 0.25  ?  Years: 20.00  ?  Pack years: 5.00  ?  Types: Cigarettes  ?  Quit date: 01/24/1995  ?  Years since quitting: 26.2  ? Smokeless tobacco: Never  ?Vaping Use  ? Vaping Use: Never used  ?Substance and Sexual Activity  ? Alcohol use: No  ?  Alcohol/week: 0.0 standard drinks  ? Drug use: No  ? Sexual activity: Never  ?  Birth control/protection: Post-menopausal  ?Other Topics Concern  ? Not on file  ?Social History Narrative  ? Not on file  ? ?Social Determinants of Health  ? ?Financial  Resource Strain: Not on file  ?Food Insecurity: Not on file  ?Transportation Needs: Not on file  ?Physical Activity: Not on file  ?Stress: Not on file  ?Social Connections: Not on file  ? ? ?Allergies:  ?Allergies  ?Allergen Reactions  ? Sulfa Antibiotics Rash  ? ? ?Metabolic Disorder Labs: ?Lab Results  ?Component Value Date  ? HGBA1C 7.1 (H) 03/22/2021  ? MPG 157.07 03/22/2021  ? MPG 217.34 02/21/2020  ? ?No results found for: PROLACTIN ?Lab Results  ?Component Value Date  ? CHOL 107 12/02/2020  ? TRIG 82 12/02/2020  ? HDL 46 12/02/2020  ? CHOLHDL 2.3 12/02/2020  ? VLDL 30 10/05/2019  ? LDLCALC 45 12/02/2020  ? [place] 81 10/05/2019  ? ?Lab Results  ?Component Value Date  ? TSH 1.076 04/23/2021  ? TSH 1.370 12/02/2020  ? ? ?Therapeutic Level Labs: ?No results found for: LITHIUM ?No results found for: VALPROATE ?No components found for:  CBMZ ? ?Current Medications: ?Current Outpatient Medications  ?Medication Sig Dispense Refill  ? ABILIFY 2 MG tablet Take 1 tablet (2 mg total) by mouth daily. (Patient taking differently: Take 2 mg by mouth at bedtime.) 30 tablet 5  ? acetaminophen (TYLENOL) 325 MG tablet Take 650 mg by mouth 3 (three) times daily as needed for mild pain or moderate pain.     ? amiodarone (PACERONE) 200 MG tablet Take 1 tablet (200 mg total) by mouth daily.    ? apixaban (ELIQUIS) 5 MG TABS tablet Take 1 tablet (5 mg total) by mouth 2 (two) times daily. 60 tablet 0  ? atorvastatin (LIPITOR) 40 MG tablet Take 1 tablet by mouth at bedtime.    ? bisacodyl (DULCOLAX) 5 MG EC tablet Take 1 tablet (5 mg total) by mouth daily as needed for moderate constipation or mild constipation. 30 tablet 1  ? calcium carbonate (TUMS - DOSED IN MG ELEMENTAL CALCIUM) 500 MG chewable tablet Chew 1 tablet by mouth 4 (four) times daily as needed for heartburn.    ? Carboxymethylcellulose Sodium (ARTIFICIAL TEARS OP) Place 1 drop into both eyes 3 (three) times daily.    ? Cholecalciferol (VITAMIN D3) 50 MCG (2000 UT) TABS  Take 2,000 Units by mouth daily.    ? clonazePAM (KLONOPIN) 0.5 MG tablet Take 1 tablet (0.5 mg total) by mouth 3 (three) times daily as needed for anxiety. 10 tablet 0  ? diltiazem (CARDIZEM CD) 120 MG 24 hr capsule Take 120 mg by mouth daily.    ? DULoxetine (CYMBALTA) 60 MG capsule Take 120 mg by mouth daily.    ? gabapentin (NEURONTIN) 300 MG capsule Take 1 capsule (300 mg total) by mouth 3 (three) times daily as needed. (Patient taking differently: Take 300 mg by mouth every 8 (  eight) hours as needed (pain).)    ? HYDROcodone-acetaminophen (NORCO) 10-325 MG tablet Take 1 tablet by mouth 3 (three) times daily as needed for severe pain. 10 tablet 0  ? insulin detemir (LEVEMIR FLEXTOUCH) 100 UNIT/ML FlexPen Inject 55 Units into the skin at bedtime. 45 mL 3  ? latanoprost (XALATAN) 0.005 % ophthalmic solution Place 1 drop into both eyes at bedtime.    ? loratadine (CLARITIN) 10 MG tablet Take 10 mg by mouth daily.    ? metoprolol succinate (TOPROL-XL) 50 MG 24 hr tablet Take 1 tablet (50 mg total) by mouth daily. Take with or immediately following a meal.    ? midodrine (PROAMATINE) 10 MG tablet Take 10 mg by mouth 3 (three) times daily.    ? mirtazapine (REMERON) 30 MG tablet Take 30 mg by mouth at bedtime.    ? naloxegol oxalate (MOVANTIK) 12.5 MG TABS tablet Take 1 tablet (12.5 mg total) by mouth daily. 30 tablet 1  ? NOVOLOG FLEXPEN 100 UNIT/ML FlexPen INJECT SUBCUTANEOUSLY AS FOLLOWS WITH MEALS: 90-150=10u: 151-200=11u: 201-250=12u: 251-300=13u: 301-350=14u: 351-400=15u: BS>400=16u & CALL MD. (Patient taking differently: 10-16 Units 3 (three) times daily with meals. INJECT SUBCUTANEOUSLY AS FOLLOWS WITH MEALS: 90-150=10u: 151-200=11u: 201-250=12u: 251-300=13u: 301-350=14u: 351-400=15u: BS>400=16u & CALL MD.) 15 mL 0  ? ondansetron (ZOFRAN) 4 MG tablet Take 1 tablet (4 mg total) by mouth 2 (two) times daily as needed for nausea or vomiting. (Patient taking differently: Take 4 mg by mouth every 12 (twelve) hours  as needed for nausea or vomiting.)    ? oxybutynin (DITROPAN-XL) 5 MG 24 hr tablet Take 5 mg by mouth daily.    ? pantoprazole (PROTONIX) 40 MG tablet Take 1 tablet (40 mg total) by mouth 2 (two) times

## 2021-05-05 ENCOUNTER — Telehealth: Payer: Self-pay | Admitting: Psychiatry

## 2021-05-05 ENCOUNTER — Telehealth: Payer: Medicare Other | Admitting: Psychiatry

## 2021-05-05 NOTE — Telephone Encounter (Signed)
Talked with staff at [facility] for her appointment today. They have a psychiatrist visiting their group home every month. They want to transfer the care. They agreed that medication to be prescribed by that psychiatrist moving forward.  ?

## 2021-05-16 ENCOUNTER — Emergency Department (HOSPITAL_COMMUNITY)
Admission: EM | Admit: 2021-05-16 | Discharge: 2021-05-17 | Disposition: A | Discharge status: Home / Self Care | Payer: Medicare Other | Source: Home / Self Care | Attending: Emergency Medicine | Admitting: Emergency Medicine

## 2021-05-16 ENCOUNTER — Other Ambulatory Visit: Payer: Self-pay

## 2021-05-16 ENCOUNTER — Emergency Department (HOSPITAL_COMMUNITY): Payer: Medicare Other

## 2021-05-16 ENCOUNTER — Encounter (HOSPITAL_COMMUNITY): Payer: Self-pay

## 2021-05-16 DIAGNOSIS — Z86718 Personal history of other venous thrombosis and embolism: Secondary | ICD-10-CM | POA: Insufficient documentation

## 2021-05-16 DIAGNOSIS — M7989 Other specified soft tissue disorders: Secondary | ICD-10-CM | POA: Insufficient documentation

## 2021-05-16 DIAGNOSIS — M79672 Pain in left foot: Secondary | ICD-10-CM | POA: Insufficient documentation

## 2021-05-16 DIAGNOSIS — Z794 Long term (current) use of insulin: Secondary | ICD-10-CM | POA: Insufficient documentation

## 2021-05-16 DIAGNOSIS — Z7901 Long term (current) use of anticoagulants: Secondary | ICD-10-CM | POA: Insufficient documentation

## 2021-05-16 DIAGNOSIS — J189 Pneumonia, unspecified organism: Secondary | ICD-10-CM | POA: Diagnosis not present

## 2021-05-16 LAB — CBC WITH DIFFERENTIAL/PLATELET
Abs Immature Granulocytes: 0.04 10*3/uL (ref 0.00–0.07)
Basophils Absolute: 0 10*3/uL (ref 0.0–0.1)
Basophils Relative: 0 %
Eosinophils Absolute: 0.1 10*3/uL (ref 0.0–0.5)
Eosinophils Relative: 0 %
HCT: 37 % (ref 36.0–46.0)
Hemoglobin: 12.1 g/dL (ref 12.0–15.0)
Immature Granulocytes: 0 %
Lymphocytes Relative: 11 %
Lymphs Abs: 1.3 10*3/uL (ref 0.7–4.0)
MCH: 28.9 pg (ref 26.0–34.0)
MCHC: 32.7 g/dL (ref 30.0–36.0)
MCV: 88.5 fL (ref 80.0–100.0)
Monocytes Absolute: 0.6 10*3/uL (ref 0.1–1.0)
Monocytes Relative: 5 %
Neutro Abs: 9.4 10*3/uL — ABNORMAL HIGH (ref 1.7–7.7)
Neutrophils Relative %: 84 %
Platelets: 291 10*3/uL (ref 150–400)
RBC: 4.18 MIL/uL (ref 3.87–5.11)
RDW: 13.8 % (ref 11.5–15.5)
WBC: 11.3 10*3/uL — ABNORMAL HIGH (ref 4.0–10.5)
nRBC: 0 % (ref 0.0–0.2)

## 2021-05-16 LAB — BASIC METABOLIC PANEL
Anion gap: 9 (ref 5–15)
BUN: 13 mg/dL (ref 8–23)
CO2: 28 mmol/L (ref 22–32)
Calcium: 8.9 mg/dL (ref 8.9–10.3)
Chloride: 100 mmol/L (ref 98–111)
Creatinine, Ser: 0.64 mg/dL (ref 0.44–1.00)
GFR, Estimated: >60 mL/min (ref >60)
Glucose, Bld: 129 mg/dL — ABNORMAL HIGH (ref 70–99)
Potassium: 4 mmol/L (ref 3.5–5.1)
Sodium: 137 mmol/L (ref 135–145)

## 2021-05-16 MED ORDER — IOHEXOL 350 MG/ML SOLN
125.0000 mL | Freq: Once | INTRAVENOUS | Status: AC | PRN
  Administered 2021-05-16: 125 mL via INTRAVENOUS

## 2021-05-16 MED ORDER — LACTATED RINGERS IV BOLUS
500.0000 mL | Freq: Once | INTRAVENOUS | Status: AC
  Administered 2021-05-16: 500 mL via INTRAVENOUS

## 2021-05-16 MED ORDER — OXYCODONE-ACETAMINOPHEN 5-325 MG PO TABS
1.0000 | ORAL_TABLET | Freq: Once | ORAL | Status: AC
  Administered 2021-05-16: 1 via ORAL
  Filled 2021-05-16: qty 1

## 2021-05-16 NOTE — ED Notes (Signed)
Patient transported to Ultrasound 

## 2021-05-16 NOTE — ED Provider Notes (Signed)
?[place] ?Provider Note ? ? ?CSN: [id number] ?Arrival date & time: 05/16/21  1453 ? ?  ? ?History ? ?Chief Complaint  ?Patient presents with  ? Foot Pain  ? ? ?[name] is a 76 y.o. female. ? ?76 year old female presents today for evaluation of left foot pain since yesterday.  She has not taken anything for this prior to arrival.  She stays at a assisted living facility.  Patient was evaluated by primary care provider and was referred to emergency room given concern for DVT.  She has history of recurrent DVTs.  She is on Eliquis and reports compliance.  She denies significant activity.  She states she works with physical therapy multiple times a week.  Denies pain to her calf.  Denies chest pain, shortness of breath, or injury to her foot.  States her whole left foot is in pain. ? ?The history is provided by the patient. No language interpreter was used.  ? ?  ? ?Home Medications ?Prior to Admission medications   ?Medication Sig Start Date End Date Taking? Authorizing Provider  ?ABILIFY 2 MG tablet Take 1 tablet (2 mg total) by mouth daily. ?Patient taking differently: Take 2 mg by mouth at bedtime. 11/09/20 05/08/21  [name], MD  ?acetaminophen (TYLENOL) 325 MG tablet Take 650 mg by mouth 3 (three) times daily as needed for mild pain or moderate pain.     [provider]  ?amiodarone (PACERONE) 200 MG tablet Take 1 tablet (200 mg total) by mouth daily. 04/24/21   Barton Dubois, MD  ?apixaban (ELIQUIS) 5 MG TABS tablet Take 1 tablet (5 mg total) by mouth 2 (two) times daily. 02/21/20   Richarda Osmond, MD  ?atorvastatin (LIPITOR) 40 MG tablet Take 1 tablet by mouth at bedtime. 05/30/20   [provider]  ?bisacodyl (DULCOLAX) 5 MG EC tablet Take 1 tablet (5 mg total) by mouth daily as needed for moderate constipation or mild constipation. 04/24/21 04/24/22  Barton Dubois, MD  ?calcium carbonate (TUMS - DOSED IN MG ELEMENTAL CALCIUM) 500 MG chewable tablet Chew 1  tablet by mouth 4 (four) times daily as needed for heartburn.    [provider]  ?Carboxymethylcellulose Sodium (ARTIFICIAL TEARS OP) Place 1 drop into both eyes 3 (three) times daily.    [provider]  ?Cholecalciferol (VITAMIN D3) 50 MCG (2000 UT) TABS Take 2,000 Units by mouth daily.    [provider]  ?clonazePAM (KLONOPIN) 0.5 MG tablet Take 1 tablet (0.5 mg total) by mouth 3 (three) times daily as needed for anxiety. 04/01/21   Orson Eva, MD  ?diltiazem (CARDIZEM CD) 120 MG 24 hr capsule Take 120 mg by mouth daily. 05/30/20   [provider]  ?DULoxetine (CYMBALTA) 60 MG capsule Take 120 mg by mouth daily.    [provider]  ?gabapentin (NEURONTIN) 300 MG capsule Take 1 capsule (300 mg total) by mouth 3 (three) times daily as needed. ?Patient taking differently: Take 300 mg by mouth every 8 (eight) hours as needed (pain). 02/21/20   Richarda Osmond, MD  ?HYDROcodone-acetaminophen (NORCO) 10-325 MG tablet Take 1 tablet by mouth 3 (three) times daily as needed for severe pain. 04/01/21   Orson Eva, MD  ?insulin detemir (LEVEMIR FLEXTOUCH) 100 UNIT/ML FlexPen Inject 55 Units into the skin at bedtime. 09/08/20   Brita Romp, NP  ?latanoprost (XALATAN) 0.005 % ophthalmic solution Place 1 drop into both eyes at bedtime.    [provider]  ?  loratadine (CLARITIN) 10 MG tablet Take 10 mg by mouth daily.    [provider]  ?metoprolol succinate (TOPROL-XL) 50 MG 24 hr tablet Take 1 tablet (50 mg total) by mouth daily. Take with or immediately following a meal. 04/01/21   Tat, Shanon Brow, MD  ?midodrine (PROAMATINE) 10 MG tablet Take 10 mg by mouth 3 (three) times daily.    [provider]  ?mirtazapine (REMERON) 30 MG tablet Take 30 mg by mouth at bedtime.    [provider]  ?naloxegol oxalate (MOVANTIK) 12.5 MG TABS tablet Take 1 tablet (12.5 mg total) by mouth daily. 04/24/21   Barton Dubois, MD  ?NOVOLOG FLEXPEN 100 UNIT/ML  FlexPen INJECT SUBCUTANEOUSLY AS FOLLOWS WITH MEALS: 90-150=10u: 151-200=11u: 201-250=12u: 251-300=13u: 301-350=14u: 351-400=15u: BS>400=16u & CALL MD. ?Patient taking differently: 10-16 Units 3 (three) times daily with meals. INJECT SUBCUTANEOUSLY AS FOLLOWS WITH MEALS: 90-150=10u: 151-200=11u: 201-250=12u: 251-300=13u: 301-350=14u: 351-400=15u: BS>400=16u & CALL MD. 02/28/21   Brita Romp, NP  ?ondansetron (ZOFRAN) 4 MG tablet Take 1 tablet (4 mg total) by mouth 2 (two) times daily as needed for nausea or vomiting. ?Patient taking differently: Take 4 mg by mouth every 12 (twelve) hours as needed for nausea or vomiting. 04/26/20   Johnson, Clanford L, MD  ?oxybutynin (DITROPAN-XL) 5 MG 24 hr tablet Take 5 mg by mouth daily. 04/20/21   [provider]  ?pantoprazole (PROTONIX) 40 MG tablet Take 1 tablet (40 mg total) by mouth 2 (two) times daily before a meal. 11/17/20   Mahala Menghini, PA-C  ?polyethylene glycol (MIRALAX / GLYCOLAX) 17 g packet Take 17 g by mouth daily. 04/24/21   Barton Dubois, MD  ?risperiDONE (RISPERDAL) 1 MG tablet Take 1-2 mg by mouth 2 (two) times daily. Take 1 tablet by mouth in the morning. Take 2 tablets by mouth at bedtime. 04/04/21   [provider]  ?TRADJENTA 5 MG TABS tablet TAKE (1) TABLET BY MOUTH ONCE DAILY. ?Patient taking differently: Take 5 mg by mouth daily. 04/04/21   Brita Romp, NP  ?   ? ?Allergies    ?Sulfa antibiotics   ? ?Review of Systems   ?Review of Systems  ?Respiratory:  Negative for shortness of breath.   ?Cardiovascular:  Negative for chest pain.  ?Musculoskeletal:  Positive for arthralgias. Negative for joint swelling.  ?Skin:  Negative for wound.  ?All other systems reviewed and are negative. ? ?Physical Exam ?Updated Vital Signs ?BP 121/72 (BP Location: Right Arm)   Pulse 94   Temp 97.6 ?F (36.4 ?C) (Oral)   Resp 18   Ht '5\' 6"'$  (1.676 m)   Wt 61.2 kg   SpO2 97%   BMI 21.79 kg/m?  ?Physical Exam ?Vitals and nursing note reviewed.   ?Constitutional:   ?   General: She is not in acute distress. ?   Appearance: Normal appearance. She is not ill-appearing.  ?HENT:  ?   Head: Normocephalic and atraumatic.  ?   Nose: Nose normal.  ?Eyes:  ?   General: No scleral icterus. ?   Extraocular Movements: Extraocular movements intact.  ?   Conjunctiva/sclera: Conjunctivae normal.  ?Cardiovascular:  ?   Rate and Rhythm: Normal rate and regular rhythm.  ?   Pulses: Normal pulses.  ?   Heart sounds: Normal heart sounds.  ?Pulmonary:  ?   Effort: Pulmonary effort is normal. No respiratory distress.  ?   Breath sounds: Normal breath sounds. No wheezing or rales.  ?Abdominal:  ?  General: There is no distension.  ?   Tenderness: There is no abdominal tenderness.  ?Musculoskeletal:     ?   General: Normal range of motion.  ?   Cervical back: Normal range of motion.  ?   Comments: Left foot without tenderness to palpation.  2+ DP pulse present.  Brisk cap refill.  Ankle without tenderness to palpation and full range of motion present.  Left calf with mild tenderness to palpation.  Compared to right lower extremity left lower extremity does have swelling.  ?Skin: ?   General: Skin is warm and dry.  ?Neurological:  ?   General: No focal deficit present.  ?   Mental Status: She is alert. Mental status is at baseline.  ? ? ?ED Results / Procedures / Treatments   ?Labs ?(all labs ordered are listed, but only abnormal results are displayed) ?Labs Reviewed  ?CBC WITH DIFFERENTIAL/PLATELET - Abnormal; Notable for the following components:  ?    Result Value  ? WBC 11.3 (*)   ? Neutro Abs 9.4 (*)   ? All other components within normal limits  ?BASIC METABOLIC PANEL - Abnormal; Notable for the following components:  ? Glucose, Bld 129 (*)   ? All other components within normal limits  ? ? ?EKG ?None ? ?Radiology ?No results found. ? ?Procedures ?Procedures  ? ? ?Medications Ordered in ED ?Medications - No data to display ? ?ED Course/ Medical Decision Making/ A&P ?  ?                         ?Medical Decision Making ?Amount and/or Complexity of Data Reviewed ?Labs: ordered. ?Radiology: ordered. ? ? ?76 year old female presents today for evaluation of left foot pain since yesterday.  She

## 2021-05-16 NOTE — ED Provider Notes (Signed)
Care of patient assumed from [place] at 7:30 PM.  This patient arrives for left foot pain.  She does have a history of CVA with chronic left-sided deficits.  She has a history of left-sided DVT and is currently on Eliquis.  DVT study today showed chronic findings only. ?Physical Exam  ?BP 124/78 (BP Location: Right Arm)   Pulse (!) 115   Temp 97.6 ?F (36.4 ?C) (Oral)   Resp 17   Ht '5\' 6"'$  (1.676 m)   Wt 61.2 kg   SpO2 97%   BMI 21.79 kg/m?  ? ?Physical Exam ?Vitals and nursing note reviewed.  ?Constitutional:   ?   General: She is not in acute distress. ?   Appearance: Normal appearance. She is well-developed. She is not toxic-appearing or diaphoretic.  ?HENT:  ?   Head: Normocephalic and atraumatic.  ?   Right Ear: External ear normal.  ?   Left Ear: External ear normal.  ?   Nose: Nose normal.  ?   Mouth/Throat:  ?   Mouth: Mucous membranes are moist.  ?   Pharynx: Oropharynx is clear.  ?Eyes:  ?   Extraocular Movements: Extraocular movements intact.  ?   Conjunctiva/sclera: Conjunctivae normal.  ?Pulmonary:  ?   Effort: Pulmonary effort is normal. No respiratory distress.  ?Abdominal:  ?   Palpations: Abdomen is soft.  ?   Tenderness: There is no abdominal tenderness.  ?Musculoskeletal:  ?   Cervical back: Normal range of motion. No rigidity.  ?   Comments: Increased swelling to LLE when compared to RLE, which patient states is baseline for her.  Distal LLE is cool to the touch when compared to RLE.  No calf tenderness is present.  ?Skin: ?   General: Skin is warm and dry.  ?Neurological:  ?   Mental Status: She is alert and oriented to person, place, and time. Mental status is at baseline.  ?Psychiatric:     ?   Mood and Affect: Mood normal.     ?   Behavior: Behavior normal.  ? ? ?Procedures  ?Procedures ? ?ED Course / MDM  ?  ?Medical Decision Making ?Amount and/or Complexity of Data Reviewed ?Labs: ordered. ?Radiology: ordered. ? ?Risk ?Prescription drug management. ? ? ?On assessment, patient sitting in  ED chair.  She endorses pain in her left foot only.  She states that she feels this pain "down to the bone".  She does have some increased swelling of her distal LLE when compared to the right but she states that this is baseline for her.  She does not have any ankle or calf tenderness.  She denies any recent injuries.  Distal LLE is cool to the touch when compared to the right.  Distal pulses were difficult to palpate on both legs.  I was able to Doppler PT and DP pulses on both lower extremities.  Patient underwent a CTA with runoff.  This study did show sluggish flow in all 3 vessels below the level of mid calf on the left side.  I suspect that patient's symptoms are secondary to claudication and that her presentation is atypical given her history of left-sided deficits following her prior stroke.  Given no evidence of ischemia at this time, patient is stable for discharge.  She would benefit from follow-up with vascular surgery.  Contact information was provided.  Patient was discharged in stable condition. ? ? ? ? ?  ?Godfrey Pick, MD ?05/17/21 1412 ? ?

## 2021-05-16 NOTE — ED Triage Notes (Signed)
Pt c/o left foot pain since Sunday night. Pt denies any injury. ?

## 2021-05-17 ENCOUNTER — Other Ambulatory Visit: Payer: Self-pay | Admitting: Gerontology

## 2021-05-17 DIAGNOSIS — I739 Peripheral vascular disease, unspecified: Secondary | ICD-10-CM

## 2021-05-17 MED ORDER — MIRTAZAPINE 15 MG PO TABS: 30.0000 mg | ORAL_TABLET | Freq: Every day | ORAL | Status: DC

## 2021-05-17 MED ORDER — GABAPENTIN 300 MG PO CAPS: 300.0000 mg | ORAL_CAPSULE | Freq: Three times a day (TID) | ORAL | Status: DC | PRN

## 2021-05-17 MED ORDER — ARIPIPRAZOLE 2 MG PO TABS
2.0000 mg | ORAL_TABLET | Freq: Every day | ORAL | Status: DC
  Filled 2021-05-17 (×2): qty 1

## 2021-05-17 MED ORDER — APIXABAN 5 MG PO TABS: 5.0000 mg | ORAL_TABLET | Freq: Two times a day (BID) | ORAL | Status: DC

## 2021-05-17 MED ORDER — HYDROCODONE-ACETAMINOPHEN 10-325 MG PO TABS
1.0000 | ORAL_TABLET | Freq: Three times a day (TID) | ORAL | Status: DC | PRN
Start: 2021-05-17 — End: 2021-05-17

## 2021-05-17 MED ORDER — ATORVASTATIN CALCIUM 40 MG PO TABS: 40.0000 mg | ORAL_TABLET | Freq: Every day | ORAL | Status: DC

## 2021-05-17 NOTE — Discharge Instructions (Signed)
You have diminished blood flow in the vessels of your lower left leg.  I suspect this is the cause of your pain.  Continue to take your Eliquis.  There is a number below to call for vascular surgeon follow-up to discuss your symptoms and any further options that may be available.  Return to the emergency department for any new or worsening symptoms. ?

## 2021-05-18 ENCOUNTER — Ambulatory Visit (HOSPITAL_COMMUNITY): Admission: RE | Admit: 2021-05-18 | Payer: Medicare Other | Source: Ambulatory Visit

## 2021-05-18 ENCOUNTER — Encounter (HOSPITAL_COMMUNITY): Payer: Self-pay

## 2021-05-18 ENCOUNTER — Inpatient Hospital Stay (HOSPITAL_COMMUNITY)
Admission: EM | Admit: 2021-05-18 | Discharge: 2021-05-20 | DRG: 194 | Disposition: A | Discharge status: Nursing Home (Includes ICF) | Payer: Medicare Other | Attending: Internal Medicine | Admitting: Internal Medicine

## 2021-05-18 ENCOUNTER — Emergency Department (HOSPITAL_COMMUNITY): Payer: Medicare Other

## 2021-05-18 ENCOUNTER — Encounter (HOSPITAL_COMMUNITY): Payer: Self-pay | Admitting: Emergency Medicine

## 2021-05-18 ENCOUNTER — Other Ambulatory Visit: Payer: Self-pay

## 2021-05-18 DIAGNOSIS — Z8616 Personal history of COVID-19: Secondary | ICD-10-CM

## 2021-05-18 DIAGNOSIS — I82409 Acute embolism and thrombosis of unspecified deep veins of unspecified lower extremity: Secondary | ICD-10-CM | POA: Diagnosis present

## 2021-05-18 DIAGNOSIS — Z8249 Family history of ischemic heart disease and other diseases of the circulatory system: Secondary | ICD-10-CM

## 2021-05-18 DIAGNOSIS — Z7901 Long term (current) use of anticoagulants: Secondary | ICD-10-CM

## 2021-05-18 DIAGNOSIS — Z882 Allergy status to sulfonamides status: Secondary | ICD-10-CM

## 2021-05-18 DIAGNOSIS — Z794 Long term (current) use of insulin: Secondary | ICD-10-CM | POA: Diagnosis not present

## 2021-05-18 DIAGNOSIS — Z7984 Long term (current) use of oral hypoglycemic drugs: Secondary | ICD-10-CM

## 2021-05-18 DIAGNOSIS — J189 Pneumonia, unspecified organism: Principal | ICD-10-CM | POA: Diagnosis present

## 2021-05-18 DIAGNOSIS — I82512 Chronic embolism and thrombosis of left femoral vein: Secondary | ICD-10-CM | POA: Diagnosis present

## 2021-05-18 DIAGNOSIS — E1165 Type 2 diabetes mellitus with hyperglycemia: Secondary | ICD-10-CM | POA: Diagnosis not present

## 2021-05-18 DIAGNOSIS — Z20822 Contact with and (suspected) exposure to covid-19: Secondary | ICD-10-CM | POA: Diagnosis present

## 2021-05-18 DIAGNOSIS — F419 Anxiety disorder, unspecified: Secondary | ICD-10-CM | POA: Diagnosis present

## 2021-05-18 DIAGNOSIS — I10 Essential (primary) hypertension: Secondary | ICD-10-CM | POA: Diagnosis present

## 2021-05-18 DIAGNOSIS — R0902 Hypoxemia: Secondary | ICD-10-CM | POA: Diagnosis present

## 2021-05-18 DIAGNOSIS — I69354 Hemiplegia and hemiparesis following cerebral infarction affecting left non-dominant side: Secondary | ICD-10-CM

## 2021-05-18 DIAGNOSIS — Z79899 Other long term (current) drug therapy: Secondary | ICD-10-CM | POA: Diagnosis not present

## 2021-05-18 DIAGNOSIS — F39 Unspecified mood [affective] disorder: Secondary | ICD-10-CM | POA: Diagnosis present

## 2021-05-18 DIAGNOSIS — K2930 Chronic superficial gastritis without bleeding: Secondary | ICD-10-CM | POA: Diagnosis not present

## 2021-05-18 DIAGNOSIS — E1151 Type 2 diabetes mellitus with diabetic peripheral angiopathy without gangrene: Secondary | ICD-10-CM | POA: Diagnosis present

## 2021-05-18 DIAGNOSIS — E11649 Type 2 diabetes mellitus with hypoglycemia without coma: Secondary | ICD-10-CM | POA: Diagnosis not present

## 2021-05-18 DIAGNOSIS — F32A Depression, unspecified: Secondary | ICD-10-CM | POA: Diagnosis present

## 2021-05-18 DIAGNOSIS — K219 Gastro-esophageal reflux disease without esophagitis: Secondary | ICD-10-CM | POA: Diagnosis present

## 2021-05-18 DIAGNOSIS — I6930 Unspecified sequelae of cerebral infarction: Secondary | ICD-10-CM | POA: Diagnosis not present

## 2021-05-18 DIAGNOSIS — E785 Hyperlipidemia, unspecified: Secondary | ICD-10-CM | POA: Diagnosis present

## 2021-05-18 DIAGNOSIS — Z87891 Personal history of nicotine dependence: Secondary | ICD-10-CM | POA: Diagnosis not present

## 2021-05-18 DIAGNOSIS — I480 Paroxysmal atrial fibrillation: Secondary | ICD-10-CM | POA: Diagnosis not present

## 2021-05-18 DIAGNOSIS — E119 Type 2 diabetes mellitus without complications: Secondary | ICD-10-CM

## 2021-05-18 LAB — CBC WITH DIFFERENTIAL/PLATELET
Abs Immature Granulocytes: 0.06 10*3/uL (ref 0.00–0.07)
Basophils Absolute: 0 10*3/uL (ref 0.0–0.1)
Basophils Relative: 0 %
Eosinophils Absolute: 0 10*3/uL (ref 0.0–0.5)
Eosinophils Relative: 0 %
HCT: 30.7 % — ABNORMAL LOW (ref 36.0–46.0)
Hemoglobin: 10.1 g/dL — ABNORMAL LOW (ref 12.0–15.0)
Immature Granulocytes: 1 %
Lymphocytes Relative: 6 %
Lymphs Abs: 0.7 10*3/uL (ref 0.7–4.0)
MCH: 29.1 pg (ref 26.0–34.0)
MCHC: 32.9 g/dL (ref 30.0–36.0)
MCV: 88.5 fL (ref 80.0–100.0)
Monocytes Absolute: 0.7 10*3/uL (ref 0.1–1.0)
Monocytes Relative: 5 %
Neutro Abs: 11.5 10*3/uL — ABNORMAL HIGH (ref 1.7–7.7)
Neutrophils Relative %: 88 %
Platelets: 240 10*3/uL (ref 150–400)
RBC: 3.47 MIL/uL — ABNORMAL LOW (ref 3.87–5.11)
RDW: 14.1 % (ref 11.5–15.5)
WBC: 13 10*3/uL — ABNORMAL HIGH (ref 4.0–10.5)
nRBC: 0 % (ref 0.0–0.2)

## 2021-05-18 LAB — BASIC METABOLIC PANEL
Anion gap: 6 (ref 5–15)
BUN: 14 mg/dL (ref 8–23)
CO2: 30 mmol/L (ref 22–32)
Calcium: 8.4 mg/dL — ABNORMAL LOW (ref 8.9–10.3)
Chloride: 99 mmol/L (ref 98–111)
Creatinine, Ser: 0.64 mg/dL (ref 0.44–1.00)
GFR, Estimated: >60 mL/min (ref >60)
Glucose, Bld: 112 mg/dL — ABNORMAL HIGH (ref 70–99)
Potassium: 3.6 mmol/L (ref 3.5–5.1)
Sodium: 135 mmol/L (ref 135–145)

## 2021-05-18 LAB — TROPONIN I (HIGH SENSITIVITY)
Troponin I (High Sensitivity): 6 ng/L (ref <18)
Troponin I (High Sensitivity): 7 ng/L (ref <18)

## 2021-05-18 LAB — GLUCOSE, CAPILLARY
Glucose-Capillary: 74 mg/dL (ref 70–99)
Glucose-Capillary: 88 mg/dL (ref 70–99)

## 2021-05-18 LAB — MAGNESIUM
Magnesium: 2.1 mg/dL (ref 1.7–2.4)
Magnesium: 2.1 mg/dL (ref 1.7–2.4)

## 2021-05-18 LAB — RESP PANEL BY RT-PCR (FLU A&B, COVID) ARPGX2
Influenza A by PCR: NEGATIVE
Influenza B by PCR: NEGATIVE
SARS Coronavirus 2 by RT PCR: NEGATIVE

## 2021-05-18 LAB — BRAIN NATRIURETIC PEPTIDE: B Natriuretic Peptide: 348 pg/mL — ABNORMAL HIGH (ref 0.0–100.0)

## 2021-05-18 LAB — PHOSPHORUS: Phosphorus: 3.4 mg/dL (ref 2.5–4.6)

## 2021-05-18 MED ORDER — ONDANSETRON HCL 4 MG PO TABS: 4.0000 mg | ORAL_TABLET | Freq: Four times a day (QID) | ORAL | Status: DC | PRN

## 2021-05-18 MED ORDER — IOHEXOL 350 MG/ML SOLN
75.0000 mL | Freq: Once | INTRAVENOUS | Status: AC | PRN
Start: 2021-05-18 — End: 2021-05-18
  Administered 2021-05-18: 75 mL via INTRAVENOUS

## 2021-05-18 MED ORDER — INSULIN DETEMIR 100 UNIT/ML ~~LOC~~ SOLN
35.0000 [IU] | Freq: Every day | SUBCUTANEOUS | Status: DC
  Administered 2021-05-18: 35 [IU] via SUBCUTANEOUS
  Filled 2021-05-18 (×2): qty 0.35

## 2021-05-18 MED ORDER — DULOXETINE HCL 60 MG PO CPEP
120.0000 mg | ORAL_CAPSULE | Freq: Every day | ORAL | Status: DC
  Administered 2021-05-19 – 2021-05-20 (×2): 120 mg via ORAL
  Filled 2021-05-18 (×2): qty 2

## 2021-05-18 MED ORDER — AMIODARONE HCL 200 MG PO TABS
200.0000 mg | ORAL_TABLET | Freq: Every day | ORAL | Status: DC
Start: 2021-05-19 — End: 2021-05-20
  Administered 2021-05-19 – 2021-05-20 (×2): 200 mg via ORAL
  Filled 2021-05-18 (×2): qty 1

## 2021-05-18 MED ORDER — ACETAMINOPHEN 325 MG PO TABS: 650.0000 mg | ORAL_TABLET | Freq: Four times a day (QID) | ORAL | Status: DC | PRN

## 2021-05-18 MED ORDER — APIXABAN 5 MG PO TABS
5.0000 mg | ORAL_TABLET | Freq: Two times a day (BID) | ORAL | Status: DC
  Administered 2021-05-18 – 2021-05-20 (×4): 5 mg via ORAL
  Filled 2021-05-18 (×4): qty 1

## 2021-05-18 MED ORDER — POLYVINYL ALCOHOL 1.4 % OP SOLN
2.0000 [drp] | Freq: Three times a day (TID) | OPHTHALMIC | Status: DC
  Administered 2021-05-18 – 2021-05-20 (×6): 2 [drp] via OPHTHALMIC
  Filled 2021-05-18 (×2): qty 15

## 2021-05-18 MED ORDER — IPRATROPIUM-ALBUTEROL 0.5-2.5 (3) MG/3ML IN SOLN: 3.0000 mL | Freq: Four times a day (QID) | RESPIRATORY_TRACT | Status: DC | PRN

## 2021-05-18 MED ORDER — DILTIAZEM HCL ER COATED BEADS 120 MG PO CP24
120.0000 mg | ORAL_CAPSULE | Freq: Every day | ORAL | Status: DC
  Administered 2021-05-19 – 2021-05-20 (×2): 120 mg via ORAL
  Filled 2021-05-18 (×2): qty 1

## 2021-05-18 MED ORDER — SODIUM CHLORIDE 0.9 % IV SOLN
2.0000 g | INTRAVENOUS | Status: DC
  Administered 2021-05-19 – 2021-05-20 (×2): 2 g via INTRAVENOUS
  Filled 2021-05-18 (×2): qty 20

## 2021-05-18 MED ORDER — POLYETHYLENE GLYCOL 3350 17 G PO PACK: 17.0000 g | PACK | Freq: Every day | ORAL | Status: DC | PRN

## 2021-05-18 MED ORDER — ATORVASTATIN CALCIUM 40 MG PO TABS
40.0000 mg | ORAL_TABLET | Freq: Every day | ORAL | Status: DC
  Administered 2021-05-18 – 2021-05-19 (×2): 40 mg via ORAL
  Filled 2021-05-18 (×2): qty 1

## 2021-05-18 MED ORDER — ONDANSETRON HCL 4 MG/2ML IJ SOLN
4.0000 mg | Freq: Four times a day (QID) | INTRAMUSCULAR | Status: DC | PRN
Start: 2021-05-18 — End: 2021-05-20

## 2021-05-18 MED ORDER — SODIUM CHLORIDE 0.9 % IV SOLN: INTRAVENOUS | Status: AC

## 2021-05-18 MED ORDER — SODIUM CHLORIDE 0.9 % IV SOLN
1.0000 g | Freq: Once | INTRAVENOUS | Status: AC
  Administered 2021-05-18: 1 g via INTRAVENOUS
  Filled 2021-05-18: qty 10

## 2021-05-18 MED ORDER — SENNA 8.6 MG PO TABS
1.0000 | ORAL_TABLET | Freq: Every day | ORAL | Status: DC
  Administered 2021-05-19 – 2021-05-20 (×2): 8.6 mg via ORAL
  Filled 2021-05-18 (×2): qty 1

## 2021-05-18 MED ORDER — OXYBUTYNIN CHLORIDE ER 5 MG PO TB24
5.0000 mg | ORAL_TABLET | Freq: Every day | ORAL | Status: DC
Start: 2021-05-19 — End: 2021-05-20
  Administered 2021-05-19 – 2021-05-20 (×2): 5 mg via ORAL
  Filled 2021-05-18 (×2): qty 1

## 2021-05-18 MED ORDER — METOPROLOL SUCCINATE ER 50 MG PO TB24
50.0000 mg | ORAL_TABLET | Freq: Every day | ORAL | Status: DC
  Administered 2021-05-19 – 2021-05-20 (×2): 50 mg via ORAL
  Filled 2021-05-18 (×2): qty 1

## 2021-05-18 MED ORDER — RISPERIDONE 1 MG PO TABS
1.0000 mg | ORAL_TABLET | Freq: Every day | ORAL | Status: DC
  Administered 2021-05-19 – 2021-05-20 (×2): 1 mg via ORAL
  Filled 2021-05-18 (×2): qty 1

## 2021-05-18 MED ORDER — ACETAMINOPHEN 650 MG RE SUPP
650.0000 mg | Freq: Four times a day (QID) | RECTAL | Status: DC | PRN
Start: 2021-05-18 — End: 2021-05-20

## 2021-05-18 MED ORDER — LORATADINE 10 MG PO TABS
10.0000 mg | ORAL_TABLET | Freq: Every day | ORAL | Status: DC
  Administered 2021-05-18 – 2021-05-20 (×3): 10 mg via ORAL
  Filled 2021-05-18 (×3): qty 1

## 2021-05-18 MED ORDER — MIRTAZAPINE 30 MG PO TABS
30.0000 mg | ORAL_TABLET | Freq: Every day | ORAL | Status: DC
Start: 2021-05-18 — End: 2021-05-20
  Administered 2021-05-18 – 2021-05-19 (×2): 30 mg via ORAL
  Filled 2021-05-18 (×2): qty 1

## 2021-05-18 MED ORDER — SODIUM CHLORIDE 0.9 % IV SOLN
500.0000 mg | INTRAVENOUS | Status: DC
  Administered 2021-05-19 – 2021-05-20 (×2): 500 mg via INTRAVENOUS
  Filled 2021-05-18 (×2): qty 5

## 2021-05-18 MED ORDER — HYDROCODONE-ACETAMINOPHEN 10-325 MG PO TABS
1.0000 | ORAL_TABLET | Freq: Three times a day (TID) | ORAL | Status: DC | PRN
  Administered 2021-05-18 – 2021-05-20 (×4): 1 via ORAL
  Filled 2021-05-18 (×4): qty 1

## 2021-05-18 MED ORDER — VITAMIN D 25 MCG (1000 UNIT) PO TABS
2000.0000 [IU] | ORAL_TABLET | Freq: Every day | ORAL | Status: DC
  Administered 2021-05-19 – 2021-05-20 (×2): 2000 [IU] via ORAL
  Filled 2021-05-18 (×2): qty 2

## 2021-05-18 MED ORDER — ARIPIPRAZOLE 2 MG PO TABS
2.0000 mg | ORAL_TABLET | Freq: Every day | ORAL | Status: DC
Start: 2021-05-18 — End: 2021-05-20
  Administered 2021-05-18 – 2021-05-19 (×2): 2 mg via ORAL
  Filled 2021-05-18 (×5): qty 1

## 2021-05-18 MED ORDER — RISPERIDONE 1 MG PO TABS
2.0000 mg | ORAL_TABLET | Freq: Every day | ORAL | Status: DC
  Administered 2021-05-18 – 2021-05-19 (×2): 2 mg via ORAL
  Filled 2021-05-18 (×2): qty 2

## 2021-05-18 MED ORDER — SODIUM CHLORIDE 0.9 % IV SOLN
500.0000 mg | Freq: Once | INTRAVENOUS | Status: AC
  Administered 2021-05-18: 500 mg via INTRAVENOUS
  Filled 2021-05-18: qty 5

## 2021-05-18 MED ORDER — INSULIN ASPART 100 UNIT/ML IJ SOLN: 0.0000 [IU] | Freq: Every day | INTRAMUSCULAR | Status: DC

## 2021-05-18 MED ORDER — LATANOPROST 0.005 % OP SOLN
1.0000 [drp] | Freq: Every day | OPHTHALMIC | Status: DC
  Administered 2021-05-18 – 2021-05-19 (×2): 1 [drp] via OPHTHALMIC
  Filled 2021-05-18: qty 2.5

## 2021-05-18 MED ORDER — INSULIN ASPART 100 UNIT/ML IJ SOLN
0.0000 [IU] | Freq: Three times a day (TID) | INTRAMUSCULAR | Status: DC
  Administered 2021-05-19: 5 [IU] via SUBCUTANEOUS
  Administered 2021-05-19: 2 [IU] via SUBCUTANEOUS
  Administered 2021-05-20: 3 [IU] via SUBCUTANEOUS
  Administered 2021-05-20: 2 [IU] via SUBCUTANEOUS

## 2021-05-18 MED ORDER — GABAPENTIN 300 MG PO CAPS
300.0000 mg | ORAL_CAPSULE | Freq: Three times a day (TID) | ORAL | Status: DC | PRN
  Administered 2021-05-19 (×2): 300 mg via ORAL
  Filled 2021-05-18 (×2): qty 1

## 2021-05-18 MED ORDER — MIDODRINE HCL 5 MG PO TABS
10.0000 mg | ORAL_TABLET | Freq: Three times a day (TID) | ORAL | Status: DC
  Administered 2021-05-18 – 2021-05-20 (×6): 10 mg via ORAL
  Filled 2021-05-18 (×6): qty 2

## 2021-05-18 MED ORDER — CLONAZEPAM 0.5 MG PO TABS
0.5000 mg | ORAL_TABLET | Freq: Three times a day (TID) | ORAL | Status: DC | PRN
  Administered 2021-05-18 – 2021-05-20 (×5): 0.5 mg via ORAL
  Filled 2021-05-18 (×6): qty 1

## 2021-05-18 MED ORDER — PANTOPRAZOLE SODIUM 40 MG PO TBEC
40.0000 mg | DELAYED_RELEASE_TABLET | Freq: Two times a day (BID) | ORAL | Status: DC
  Administered 2021-05-18 – 2021-05-20 (×4): 40 mg via ORAL
  Filled 2021-05-18 (×4): qty 1

## 2021-05-18 MED ORDER — SODIUM CHLORIDE 0.9 % IV SOLN: 500.0000 mg | INTRAVENOUS | Status: DC

## 2021-05-18 MED ORDER — SODIUM CHLORIDE 0.9 % IV SOLN: 2.0000 g | INTRAVENOUS | Status: DC

## 2021-05-18 NOTE — Assessment & Plan Note (Addendum)
-  Appreciated on chest x-ray and CT chest at time of admission. ?-Pulmonary embolism ruled out. ?-Patient overall condition has improved/stabilized.  Has demonstrated still requirement of oxygen supplementation which will be provided at time of discharge (2 L nasal cannula to maintain saturation above 90%). ?-Patient curb 65 score 2 at time of admission. ?-40 hours IV antibiotic has been provided; patient will complete 7 more days of oral Augmentin to complete therapy. ?-Continue the use of flutter valve and supportive care. ?-As needed bronchodilator and as needed antitussive medication has been provided. ?-Outpatient follow-up with PCP in 2 weeks recommended. ?

## 2021-05-18 NOTE — ED Triage Notes (Signed)
Pt to the ED with complaints of left foot and leg pain. EMS reports she has been diagnosed with blood clots. ? ?The pt is from [place] facility and was seen here for the same 2 days ago. ? ?

## 2021-05-18 NOTE — H&P (Signed)
?History and Physical  ? ? ?Patient: [name] QIO:[id number] DOB: 01-Feb-1945 ?DOA: 05/18/2021 ?DOS: the patient was seen and examined on 05/18/2021 ?PCP: Carrolyn Meiers, MD  ?Patient coming from: SNF ? ?Chief Complaint:  ?Chief Complaint  ?Patient presents with  ? Leg Pain  ? ?HPI: [name] is a 76 y.o. female with medical history significant of paroxysmal atrial fibrillation, anxiety/depression, hypertension, orthostatic hypotension, prior history of stroke with left-sided residual deficits; type 2 diabetes mellitus, hyperlipidemia, history of recurrent lower extremity DVTs, chronic opiate usage and gastroesophageal reflux disease; who presented from her skilled nursing facility secondary to shortness of breath.  Patient reports symptom has been present for the last 4 days or so and worsening.  On today's evaluation she was found to be mildly hypoxic on room air with oxygen saturation of 86-87%.  No fever has been with ported but patient expressed chills.  Coughing spells has been mainly nonproductive there has not been any signs of hemoptysis.  Patient denies chest pain, nausea, vomiting, dysuria, hematuria, melena or hematochezia. ? ?In the ED work-up demonstrated negative COVID PCR, chest x-ray/CT of the chest with findings of multifocal infiltrate/pneumonia atelectatic changes.  Patient required 1-2 L nasal cannula supplementation to maintain saturation above 90%. ? ?Curb 65 score 2; WBCs 13.1 and otherwise unremarkable blood work. ? ?Review of Systems: As mentioned in the history of present illness. All other systems reviewed and are negative. ?Past Medical History:  ?Diagnosis Date  ? Anxiety   ? Arthritis   ? Atrial fibrillation ([place])   ? Atrial flutter ([place])   ? Collagen vascular disease ([place])   ? COVID-19 virus infection   ? COVID-19+ approx 01/24/19; asymptomatic course with full recovery  ? Dependence on wheelchair   ? pivot/transfers  ? Depression   ? History of psychosis and previous  suicide attempt  ? DVT, lower extremity, recurrent ([place])   ? Long-term Coumadin per Dr. Legrand Rams  ? Essential hypertension   ? GERD (gastroesophageal reflux disease)   ? Hemiplegia ([place]) 2010  ? Left side  ? History of stroke   ? Acute infarct and right cerebral white matter small vessel disease 12/10  ? Leg DVT (deep venous thromboembolism), acute ([place]) 2006  ? Orthostatic hypotension   ? Schizophrenia ([place])   ? Stroke [facility])   ? left sided weakness  ? Type 2 diabetes mellitus ([place])   ? ?Past Surgical History:  ?Procedure Laterality Date  ? BACK SURGERY    ? BIOPSY N/A 11/24/2014  ? Procedure: BIOPSY;  Surgeon: Danie Binder, MD;  Location: [facility];  Service: Endoscopy;  Laterality: N/A;  ? BIOPSY  05/17/2016  ? Procedure: BIOPSY;  Surgeon: Danie Binder, MD;  Location: [facility];  Service: Endoscopy;;  gastric biopsy  ? COLONOSCOPY WITH PROPOFOL N/A 11/24/2014  ? Dr. Rudie Meyer polyps removed/moderate sized internal hemorrhoids, tubular adenomas. Next surveillance in 3 years  ? ESOPHAGOGASTRODUODENOSCOPY (EGD) WITH PROPOFOL N/A 11/24/2014  ? Dr. Clayburn Pert HH/patent stricture at the gastroesophageal junction, mild non-erosive gastritis, path negative for H.pylori or celiac sprue  ? ESOPHAGOGASTRODUODENOSCOPY (EGD) WITH PROPOFOL N/A 05/17/2016  ? Procedure: ESOPHAGOGASTRODUODENOSCOPY (EGD) WITH PROPOFOL;  Surgeon: Danie Binder, MD;  Location: [facility];  Service: Endoscopy;  Laterality: N/A;  12:45pm  ? GIVENS CAPSULE STUDY N/A 12/11/2014  ? MULTILPLE EROSION IN the stomach WITH ACTIVE OOZING. OCCASIONAL EROSIONS AND RARE ULCER SEEN IN PROXIMAL SMALL BOWEL . No masses or AVMs SEEN. NO OLD BLOOD OR  FRESH BLOOD SEEN.   ? POLYPECTOMY N/A 11/24/2014  ? Procedure: POLYPECTOMY;  Surgeon: Danie Binder, MD;  Location: [facility];  Service: Endoscopy;  Laterality: N/A;  ? SAVORY DILATION N/A 05/17/2016  ? Procedure: SAVORY DILATION;  Surgeon: Danie Binder, MD;  Location: [facility];  Service: Endoscopy;  Laterality: N/A;   ? ?Social History:  reports that she quit smoking about 26 years ago. Her smoking use included cigarettes. She has a 5.00 pack-year smoking history. She has never used smokeless tobacco. She reports that she does not drink alcohol and does not use drugs. ? ?Allergies  ?Allergen Reactions  ? Sulfa Antibiotics Rash  ? ? ?Family History  ?Problem Relation Age of Onset  ? Hypertension Mother   ? Colon cancer Neg Hx   ? ? ?Prior to Admission medications   ?Medication Sig Start Date End Date Taking? Authorizing Provider  ?ABILIFY 2 MG tablet Take 1 tablet (2 mg total) by mouth daily. ?Patient taking differently: Take 2 mg by mouth at bedtime. 11/09/20 05/18/21 Yes [name], MD  ?acetaminophen (TYLENOL) 325 MG tablet Take 650 mg by mouth 3 (three) times daily as needed for mild pain or moderate pain.    Yes [provider]  ?amiodarone (PACERONE) 200 MG tablet Take 1 tablet (200 mg total) by mouth daily. 04/24/21  Yes Barton Dubois, MD  ?apixaban (ELIQUIS) 5 MG TABS tablet Take 1 tablet (5 mg total) by mouth 2 (two) times daily. 02/21/20  Yes Richarda Osmond, MD  ?atorvastatin (LIPITOR) 40 MG tablet Take 1 tablet by mouth at bedtime. 05/30/20  Yes [provider]  ?bisacodyl (DULCOLAX) 5 MG EC tablet Take 1 tablet (5 mg total) by mouth daily as needed for moderate constipation or mild constipation. 04/24/21 04/24/22 Yes Barton Dubois, MD  ?calcium carbonate (TUMS - DOSED IN MG ELEMENTAL CALCIUM) 500 MG chewable tablet Chew 1 tablet by mouth 4 (four) times daily as needed for heartburn.   Yes [provider]  ?Carboxymethylcellulose Sodium (ARTIFICIAL TEARS OP) Place 1 drop into both eyes 3 (three) times daily.   Yes [provider]  ?Cholecalciferol (VITAMIN D3) 50 MCG (2000 UT) TABS Take 2,000 Units by mouth daily.   Yes [provider]  ?clonazePAM (KLONOPIN) 0.5 MG tablet Take 1 tablet (0.5 mg total) by mouth 3 (three) times daily as needed for anxiety. 04/01/21  Yes Tat,  Shanon Brow, MD  ?diltiazem (CARDIZEM CD) 120 MG 24 hr capsule Take 120 mg by mouth daily. 05/30/20  Yes [provider]  ?DULoxetine (CYMBALTA) 60 MG capsule Take 120 mg by mouth daily.   Yes [provider]  ?gabapentin (NEURONTIN) 300 MG capsule Take 1 capsule (300 mg total) by mouth 3 (three) times daily as needed. ?Patient taking differently: Take 300 mg by mouth every 8 (eight) hours as needed (pain). 02/21/20  Yes Richarda Osmond, MD  ?HYDROcodone-acetaminophen (NORCO) 10-325 MG tablet Take 1 tablet by mouth 3 (three) times daily as needed for severe pain. 04/01/21  Yes Tat, Shanon Brow, MD  ?insulin detemir (LEVEMIR FLEXTOUCH) 100 UNIT/ML FlexPen Inject 55 Units into the skin at bedtime. 09/08/20  Yes Brita Romp, NP  ?latanoprost (XALATAN) 0.005 % ophthalmic solution Place 1 drop into both eyes at bedtime.   Yes [provider]  ?loratadine (CLARITIN) 10 MG tablet Take 10 mg by mouth daily.   Yes [provider]  ?metoprolol succinate (TOPROL-XL) 50 MG 24 hr tablet Take 1 tablet (50 mg total) by  mouth daily. Take with or immediately following a meal. 04/01/21  Yes Tat, David, MD  ?midodrine (PROAMATINE) 10 MG tablet Take 10 mg by mouth 3 (three) times daily.   Yes [provider]  ?mirtazapine (REMERON) 30 MG tablet Take 30 mg by mouth at bedtime.   Yes [provider]  ?naloxegol oxalate (MOVANTIK) 12.5 MG TABS tablet Take 1 tablet (12.5 mg total) by mouth daily. 04/24/21  Yes Barton Dubois, MD  ?NOVOLOG FLEXPEN 100 UNIT/ML FlexPen INJECT SUBCUTANEOUSLY AS FOLLOWS WITH MEALS: 90-150=10u: 151-200=11u: 201-250=12u: 251-300=13u: 301-350=14u: 351-400=15u: BS>400=16u & CALL MD. ?Patient taking differently: 10-16 Units 3 (three) times daily with meals. INJECT SUBCUTANEOUSLY AS FOLLOWS WITH MEALS: 90-150=10u: 151-200=11u: 201-250=12u: 251-300=13u: 301-350=14u: 351-400=15u: BS>400=16u & CALL MD. 02/28/21  Yes Brita Romp, NP  ?ondansetron (ZOFRAN) 4 MG tablet Take  1 tablet (4 mg total) by mouth 2 (two) times daily as needed for nausea or vomiting. ?Patient taking differently: Take 4 mg by mouth every 12 (twelve) hours as needed for nausea or vomiting. 04/26/20  Y

## 2021-05-18 NOTE — Assessment & Plan Note (Addendum)
-  With history of gastroesophageal reflux disease.  ?-Continue the use of PPI. ?

## 2021-05-18 NOTE — Assessment & Plan Note (Addendum)
-  Patient chronically on Eliquis ?-Continue current anticoagulation therapy. ?

## 2021-05-18 NOTE — ED Notes (Signed)
Report given, temp taken pta ?

## 2021-05-18 NOTE — Assessment & Plan Note (Addendum)
-  Continue home antidepressant and anxiolytic regimen. ?-No agitation or hallucinations currently appreciated on exam. ?

## 2021-05-18 NOTE — Progress Notes (Signed)
Pt arrived to room #330 via stretcher from ED. Pt moved to bed by staff. Pt c/o throat pain and cough, voice hoarse. O2 on @ 4 lpm [place] with SaO2 of 100%, resp 20 and non-labored. O2 decreased to 2 lpm [place] and SaO2 rechecked at 98%. Pt oriented to room and safety procedures, states understanding. Bed alarm on for safety. ?

## 2021-05-18 NOTE — Assessment & Plan Note (Addendum)
-  Stable overall ?-Continue home antihypertensive regimen. ?-Follow vital signs. ?-Heart healthy diet has been encouraged. ?

## 2021-05-18 NOTE — Assessment & Plan Note (Addendum)
-  Most recent A1c 7.1 continue sliding scale insulin and resumption of home Levemir (adjusted dose has been recommended given episodes of hypoglycemia while inpatient). ?-Continue to ensure patient is not a skipping meals and is maintaining adequate nutrition/hydration. ?-Continue close monitoring of patient's CBGs fluctuation with further adjustment to hypoglycemia regimen. ?

## 2021-05-18 NOTE — ED Provider Notes (Signed)
?[facility] ?Provider Note ? ? ?CSN: [id number] ?Arrival date & time: 05/18/21  3888 ? ?  ? ?History ? ?Chief Complaint  ?Patient presents with  ? Leg Pain  ? ? ?[name] is a 76 y.o. female. ? ?76 year old female with past medical history of recurrent DVTs, history of PE, CVA, A-fib on Eliquis presents today for evaluation of shortness of breath and cough.  She states this has been ongoing for the past 2 weeks.  She also complains of left lower extremity pain for which she was evaluated 2 days ago and referred to vascular surgeon for claudication.  She is not able to provide much history and states that she has blood clots in the legs.  She is currently on 1 L supplemental O2.  States she does not wear oxygen at home.  Denies a diagnosis of COPD. ?Collateral received from patient's nurse at nursing home who reports patient has no diagnosis of COPD and does not wear supplemental O2.  They are unsure of her recent O2 sats as they do not check these routinely.  She has had her morning medications including amiodarone and Toprol today.  Per nurse patient has been noted to have a cough and complains of shortness of breath over the past couple days but the nurse was unsure of duration longer than that.  Patient does have an appointment May 17 with vascular surgeon.  Patient was referred to emergency room today for shortness of breath. ? ?The history is provided by the patient. No language interpreter was used.  ? ?  ? ?Home Medications ?Prior to Admission medications   ?Medication Sig Start Date End Date Taking? Authorizing Provider  ?ABILIFY 2 MG tablet Take 1 tablet (2 mg total) by mouth daily. ?Patient taking differently: Take 2 mg by mouth at bedtime. 11/09/20 05/08/21  [name], MD  ?acetaminophen (TYLENOL) 325 MG tablet Take 650 mg by mouth 3 (three) times daily as needed for mild pain or moderate pain.     [provider]  ?amiodarone (PACERONE) 200 MG tablet Take 1 tablet  (200 mg total) by mouth daily. 04/24/21   Barton Dubois, MD  ?apixaban (ELIQUIS) 5 MG TABS tablet Take 1 tablet (5 mg total) by mouth 2 (two) times daily. 02/21/20   Richarda Osmond, MD  ?atorvastatin (LIPITOR) 40 MG tablet Take 1 tablet by mouth at bedtime. 05/30/20   [provider]  ?bisacodyl (DULCOLAX) 5 MG EC tablet Take 1 tablet (5 mg total) by mouth daily as needed for moderate constipation or mild constipation. 04/24/21 04/24/22  Barton Dubois, MD  ?calcium carbonate (TUMS - DOSED IN MG ELEMENTAL CALCIUM) 500 MG chewable tablet Chew 1 tablet by mouth 4 (four) times daily as needed for heartburn.    [provider]  ?Carboxymethylcellulose Sodium (ARTIFICIAL TEARS OP) Place 1 drop into both eyes 3 (three) times daily.    [provider]  ?Cholecalciferol (VITAMIN D3) 50 MCG (2000 UT) TABS Take 2,000 Units by mouth daily.    [provider]  ?clonazePAM (KLONOPIN) 0.5 MG tablet Take 1 tablet (0.5 mg total) by mouth 3 (three) times daily as needed for anxiety. 04/01/21   Orson Eva, MD  ?diltiazem (CARDIZEM CD) 120 MG 24 hr capsule Take 120 mg by mouth daily. 05/30/20   [provider]  ?DULoxetine (CYMBALTA) 60 MG capsule Take 120 mg by mouth daily.    [provider]  ?gabapentin (NEURONTIN) 300 MG capsule Take 1 capsule (300  mg total) by mouth 3 (three) times daily as needed. ?Patient taking differently: Take 300 mg by mouth every 8 (eight) hours as needed (pain). 02/21/20   Richarda Osmond, MD  ?HYDROcodone-acetaminophen (NORCO) 10-325 MG tablet Take 1 tablet by mouth 3 (three) times daily as needed for severe pain. 04/01/21   Orson Eva, MD  ?insulin detemir (LEVEMIR FLEXTOUCH) 100 UNIT/ML FlexPen Inject 55 Units into the skin at bedtime. 09/08/20   Brita Romp, NP  ?latanoprost (XALATAN) 0.005 % ophthalmic solution Place 1 drop into both eyes at bedtime.    [provider]  ?loratadine (CLARITIN) 10 MG tablet Take 10 mg by mouth daily.     [provider]  ?metoprolol succinate (TOPROL-XL) 50 MG 24 hr tablet Take 1 tablet (50 mg total) by mouth daily. Take with or immediately following a meal. 04/01/21   Tat, Shanon Brow, MD  ?midodrine (PROAMATINE) 10 MG tablet Take 10 mg by mouth 3 (three) times daily.    [provider]  ?mirtazapine (REMERON) 30 MG tablet Take 30 mg by mouth at bedtime.    [provider]  ?naloxegol oxalate (MOVANTIK) 12.5 MG TABS tablet Take 1 tablet (12.5 mg total) by mouth daily. 04/24/21   Barton Dubois, MD  ?NOVOLOG FLEXPEN 100 UNIT/ML FlexPen INJECT SUBCUTANEOUSLY AS FOLLOWS WITH MEALS: 90-150=10u: 151-200=11u: 201-250=12u: 251-300=13u: 301-350=14u: 351-400=15u: BS>400=16u & CALL MD. ?Patient taking differently: 10-16 Units 3 (three) times daily with meals. INJECT SUBCUTANEOUSLY AS FOLLOWS WITH MEALS: 90-150=10u: 151-200=11u: 201-250=12u: 251-300=13u: 301-350=14u: 351-400=15u: BS>400=16u & CALL MD. 02/28/21   Brita Romp, NP  ?ondansetron (ZOFRAN) 4 MG tablet Take 1 tablet (4 mg total) by mouth 2 (two) times daily as needed for nausea or vomiting. ?Patient taking differently: Take 4 mg by mouth every 12 (twelve) hours as needed for nausea or vomiting. 04/26/20   Johnson, Clanford L, MD  ?oxybutynin (DITROPAN-XL) 5 MG 24 hr tablet Take 5 mg by mouth daily. 04/20/21   [provider]  ?pantoprazole (PROTONIX) 40 MG tablet Take 1 tablet (40 mg total) by mouth 2 (two) times daily before a meal. 11/17/20   Mahala Menghini, PA-C  ?polyethylene glycol (MIRALAX / GLYCOLAX) 17 g packet Take 17 g by mouth daily. 04/24/21   Barton Dubois, MD  ?risperiDONE (RISPERDAL) 1 MG tablet Take 1-2 mg by mouth 2 (two) times daily. Take 1 tablet by mouth in the morning. Take 2 tablets by mouth at bedtime. 04/04/21   [provider]  ?TRADJENTA 5 MG TABS tablet TAKE (1) TABLET BY MOUTH ONCE DAILY. ?Patient taking differently: Take 5 mg by mouth daily. 04/04/21   Brita Romp, NP  ?   ? ?Allergies    ?Sulfa  antibiotics   ? ?Review of Systems   ?Review of Systems  ?Constitutional:  Negative for chills and fever.  ?Respiratory:  Positive for cough and shortness of breath.   ?Cardiovascular:  Positive for leg swelling. Negative for chest pain and palpitations.  ?Gastrointestinal:  Negative for abdominal pain and nausea.  ?All other systems reviewed and are negative. ? ?Physical Exam ?Updated Vital Signs ?BP 110/68   Pulse (!) 101   Temp 98.4 ?F (36.9 ?C) (Oral)   Resp (!) 25   Ht '5\' 6"'$  (1.676 m)   Wt 62.3 kg   SpO2 90%   BMI 22.18 kg/m?  ?Physical Exam ?Vitals and nursing note reviewed.  ?Constitutional:   ?   General: She is not in acute distress. ?   Appearance: Normal  appearance. She is not ill-appearing.  ?HENT:  ?   Head: Normocephalic and atraumatic.  ?   Nose: Nose normal.  ?Eyes:  ?   General: No scleral icterus. ?   Extraocular Movements: Extraocular movements intact.  ?   Conjunctiva/sclera: Conjunctivae normal.  ?Cardiovascular:  ?   Rate and Rhythm: Tachycardia present. Rhythm irregular.  ?   Pulses: Normal pulses.  ?Pulmonary:  ?   Effort: Pulmonary effort is normal. No respiratory distress.  ?   Breath sounds: Normal breath sounds. No wheezing.  ?Abdominal:  ?   General: There is no distension.  ?   Palpations: Abdomen is soft.  ?   Tenderness: There is no abdominal tenderness.  ?Musculoskeletal:     ?   General: Normal range of motion.  ?   Cervical back: Normal range of motion.  ?   Right lower leg: No edema.  ?   Left lower leg: Edema (Left lower extremity nonpitting edema compared to right side.) present.  ?Skin: ?   General: Skin is warm and dry.  ?Neurological:  ?   General: No focal deficit present.  ?   Mental Status: She is alert. Mental status is at baseline.  ? ? ?ED Results / Procedures / Treatments   ?Labs ?(all labs ordered are listed, but only abnormal results are displayed) ?Labs Reviewed - No data to display ? ?EKG ?None ? ?Radiology ?CT Angio Aortobifemoral W and/or Wo  Contrast ? ?Result Date: 05/16/2021 ?CLINICAL DATA:  Claudication/leg ischemia, recurrent DVTs, on Eliquis EXAM: CT ANGIOGRAPHY OF ABDOMINAL AORTA WITH ILIOFEMORAL RUNOFF TECHNIQUE: Multidetector CT imaging of the abdomen,

## 2021-05-18 NOTE — Assessment & Plan Note (Addendum)
-  Rate controlled; irregular rhythm appreciated. ?-Continue metoprolol, Cardizem and amiodarone. ?-Continue Eliquis for secondary prevention.   ?-Continue outpatient follow-up with cardiology service. ? ?

## 2021-05-18 NOTE — Assessment & Plan Note (Addendum)
-  No new focal deficits appreciated; patient with residual left hemiparesis unchanged. ?-Continue risk factor modifications. ?-Patient currently on Eliquis for secondary prevention. ?

## 2021-05-19 DIAGNOSIS — I6930 Unspecified sequelae of cerebral infarction: Secondary | ICD-10-CM | POA: Diagnosis not present

## 2021-05-19 DIAGNOSIS — I82409 Acute embolism and thrombosis of unspecified deep veins of unspecified lower extremity: Secondary | ICD-10-CM | POA: Diagnosis not present

## 2021-05-19 DIAGNOSIS — J189 Pneumonia, unspecified organism: Secondary | ICD-10-CM | POA: Diagnosis not present

## 2021-05-19 DIAGNOSIS — I10 Essential (primary) hypertension: Secondary | ICD-10-CM | POA: Diagnosis not present

## 2021-05-19 LAB — GLUCOSE, CAPILLARY
Glucose-Capillary: 141 mg/dL — ABNORMAL HIGH (ref 70–99)
Glucose-Capillary: 56 mg/dL — ABNORMAL LOW (ref 70–99)
Glucose-Capillary: 225 mg/dL — ABNORMAL HIGH (ref 70–99)
Glucose-Capillary: 136 mg/dL — ABNORMAL HIGH (ref 70–99)
Glucose-Capillary: 139 mg/dL — ABNORMAL HIGH (ref 70–99)

## 2021-05-19 LAB — CBC
HCT: 31.9 % — ABNORMAL LOW (ref 36.0–46.0)
Hemoglobin: 10.2 g/dL — ABNORMAL LOW (ref 12.0–15.0)
MCH: 29.1 pg (ref 26.0–34.0)
MCHC: 32 g/dL (ref 30.0–36.0)
MCV: 90.9 fL (ref 80.0–100.0)
Platelets: 220 10*3/uL (ref 150–400)
RBC: 3.51 MIL/uL — ABNORMAL LOW (ref 3.87–5.11)
RDW: 14.1 % (ref 11.5–15.5)
WBC: 12.5 10*3/uL — ABNORMAL HIGH (ref 4.0–10.5)
nRBC: 0 % (ref 0.0–0.2)

## 2021-05-19 LAB — BASIC METABOLIC PANEL
Anion gap: 6 (ref 5–15)
BUN: 12 mg/dL (ref 8–23)
CO2: 28 mmol/L (ref 22–32)
Calcium: 8.4 mg/dL — ABNORMAL LOW (ref 8.9–10.3)
Chloride: 107 mmol/L (ref 98–111)
Creatinine, Ser: 0.62 mg/dL (ref 0.44–1.00)
GFR, Estimated: >60 mL/min (ref >60)
Glucose, Bld: 45 mg/dL — ABNORMAL LOW (ref 70–99)
Potassium: 3.6 mmol/L (ref 3.5–5.1)
Sodium: 141 mmol/L (ref 135–145)

## 2021-05-19 MED ORDER — INSULIN DETEMIR 100 UNIT/ML ~~LOC~~ SOLN
28.0000 [IU] | Freq: Every day | SUBCUTANEOUS | Status: DC
  Administered 2021-05-19: 28 [IU] via SUBCUTANEOUS
  Filled 2021-05-19 (×2): qty 0.28

## 2021-05-19 MED ORDER — FUROSEMIDE 10 MG/ML IJ SOLN
20.0000 mg | Freq: Once | INTRAMUSCULAR | Status: AC
  Administered 2021-05-19: 20 mg via INTRAVENOUS
  Filled 2021-05-19: qty 2

## 2021-05-19 MED ORDER — GUAIFENESIN-DM 100-10 MG/5ML PO SYRP
5.0000 mL | ORAL_SOLUTION | ORAL | Status: DC | PRN
  Administered 2021-05-19 – 2021-05-20 (×3): 5 mL via ORAL
  Filled 2021-05-19 (×3): qty 5

## 2021-05-19 MED ORDER — DEXTROSE 50 % IV SOLN
12.5000 g | INTRAVENOUS | Status: AC
  Administered 2021-05-19: 12.5 g via INTRAVENOUS

## 2021-05-19 MED ORDER — DEXTROSE 50 % IV SOLN
INTRAVENOUS | Status: AC
  Filled 2021-05-19: qty 50

## 2021-05-19 NOTE — Progress Notes (Signed)
Hypoglycemic Event ? ?CBG: 56 ? ?Treatment: D50 25 mL (12.5 gm) ? ?Symptoms: Sweaty and Shaky ? ?Follow-up CBG: Time:0826 CBG Result:141 ? ?Possible Reasons for Event: Unknown ? ?Comments/MD notified: MD aware ? ? ? ?Aneisa Karren D Adra Shepler ? ? ?

## 2021-05-19 NOTE — Progress Notes (Signed)
?Progress Note ? ? ?Patient: [name] XHB:[id number] DOB: 1945-07-25 DOA: 05/18/2021     1 ?DOS: the patient was seen and examined on 05/19/2021 ?  ?Brief hospital course: ?[name] is a 76 y.o. female with medical history significant of paroxysmal atrial fibrillation, anxiety/depression, hypertension, orthostatic hypotension, prior history of stroke with left-sided residual deficits; type 2 diabetes mellitus, hyperlipidemia, history of recurrent lower extremity DVTs, chronic opiate usage and gastroesophageal reflux disease; who presented from her skilled nursing facility secondary to shortness of breath.  Patient reports symptom has been present for the last 4 days or so and worsening.  On today's evaluation she was found to be mildly hypoxic on room air with oxygen saturation of 86-87%.  No fever has been with ported but patient expressed chills.  Coughing spells has been mainly nonproductive there has not been any signs of hemoptysis.  Patient denies chest pain, nausea, vomiting, dysuria, hematuria, melena or hematochezia. ?  ?In the ED work-up demonstrated negative COVID PCR, chest x-ray/CT of the chest with findings of multifocal infiltrate/pneumonia atelectatic changes.  Patient required 1-2 L nasal cannula supplementation to maintain saturation above 90%. ?  ?Curb 65 score 2; WBCs 13.1 and otherwise unremarkable blood work. ? ?Assessment and Plan: ?* Multifocal pneumonia ?-Appreciated on chest x-ray and CT chest at time of admission. ?-Pulmonary embolism ruled out. ?-Patient with oxygen saturation in the 86 to 87% on room air and still requiring 2 L to maintain O2 sats percent above 90%. ?-Patient curb 65 score 2 ?-Continue current antibiotics ?-Continue the use of flutter valve and supportive care. ?-As needed bronchodilator has been added. ?-Follow clinical response. ? ?Mood disorder ([name]) ?-Continue home antidepressant and anxiolytic regimen. ?-No agitation or hallucinations currently  appreciated on exam. ? ?Paroxysmal atrial fibrillation ([name]) ?-Rate controlled; irregular rhythm appreciated. ?-Continue metoprolol, Cardizem and amiodarone. ?-Continue Eliquis for secondary prevention.   ?-Continue telemetry monitoring and follow electrolytes/replete as needed. ? ? ?History of cerebrovascular accident (CVA) with residual deficit ?-No focal deficits appreciated ?-Continue risk factor modifications. ?-Patient currently on Eliquis for secondary prevention. ? ?Chronic superficial gastritis without bleeding ?-With history of gastroesophageal reflux disease.  ?-Continue the use of PPI. ? ?DVT, lower extremity, recurrent ([name]) ?-Patient chronically on Eliquis ?-Continue current anticoagulation therapy. ? ?Essential hypertension, benign ?-Stable overall ?-Continue home antihypertensive regimen. ?-Follow vital signs. ? ?Uncontrolled type 2 diabetes mellitus with hyperglycemia, with long-term current use of insulin ([name]) ?-Most recent A1c 7.1 continue sliding scale insulin and resumption of home Levemir (adjusted dose while inpatient). ?-Follow CBGs fluctuation adjust hypoglycemic coverage and adjust hypoglycemic regimen as required. ?-Mild hypoglycemic event appreciated this morning after no eating much overnight. ? ? ? ?Subjective:  ?Afebrile, no chest pain, no nausea or vomiting.  Still requiring oxygen supplementation to maintain saturation as when trying to wean her off she drops into the mid 80s.  Complaining of nonproductive coughing spells.  General malaise. ? ?Physical Exam: ?Vitals:  ? 05/19/21 0257 05/19/21 0258 05/19/21 0445 05/19/21 0451  ?BP: (!) 155/115 (!) 98/44 100/68   ?Pulse: (!) 114 91 99   ?Resp: '20 20 19   '$ ?Temp: 99 ?F (37.2 ?C) 99 ?F (37.2 ?C) 98 ?F (36.7 ?C)   ?TempSrc:      ?SpO2: 91% 92% 94%   ?Weight:    62.1 kg  ?Height:      ? ?General exam: Alert, awake, oriented x 3; chronically ill in appearance; unchanged left hemiparesis.  Complaining of coughing spells.  2 L nasal  cannula  supplementation in place. ?Respiratory system: No wheezing, positive rhonchi appreciated bilaterally; no using accessory muscles. ?Cardiovascular system: Irregular, no rubs or gallops, no JVD. ?Gastrointestinal system: Abdomen is nondistended, soft and nontender. No organomegaly or masses felt. Normal bowel sounds heard. ?Central nervous system: Alert and oriented. No focal neurological deficits. ?Extremities: No cyanosis or clubbing. ?Skin: No petechiae. ?Psychiatry: Judgement and insight appear normal. Mood & affect appropriate.  ? ?Data Reviewed: ?Basic metabolic panel demonstrating sodium 141, potassium 3.6, bicarb 28, BUN 12 and creatinine 0.62. ?CBC with WBCs of 12.5, hemoglobin 10.2 platelet count 220 K. ? ?Family Communication: No family at bedside. ? ?Disposition: ?Status is: Inpatient ?Remains inpatient appropriate because: Still with ongoing hypoxemia, coughing spells and general malaise.  Continue IV antibiotics and supportive care.  Will check desaturation screening. ? ? Planned Discharge Destination: Skilled nursing facility ? ?Author: ?Barton Dubois, MD ?05/19/2021 6:07 PM ? ?For on call review [email or web address].  ?

## 2021-05-19 NOTE — Progress Notes (Signed)
SATURATION QUALIFICATIONS: (This note is used to comply with regulatory documentation for home oxygen) ? ?Patient Saturations on Room Air at Rest = 83 % ? ?Patient Saturations on Room Air while Ambulating = 80% ? ?Patient Saturations on 2 Liters of oxygen while Ambulating = 98% ? ?Please briefly explain why patient needs home oxygen: On room air patient dropped to 82%, on 1L of oxygen patient was at 88%. Patient remains on 2L [place] 98% ?

## 2021-05-20 DIAGNOSIS — K2930 Chronic superficial gastritis without bleeding: Secondary | ICD-10-CM | POA: Diagnosis not present

## 2021-05-20 DIAGNOSIS — J189 Pneumonia, unspecified organism: Secondary | ICD-10-CM | POA: Diagnosis not present

## 2021-05-20 DIAGNOSIS — I10 Essential (primary) hypertension: Secondary | ICD-10-CM | POA: Diagnosis not present

## 2021-05-20 DIAGNOSIS — I82409 Acute embolism and thrombosis of unspecified deep veins of unspecified lower extremity: Secondary | ICD-10-CM | POA: Diagnosis not present

## 2021-05-20 LAB — BASIC METABOLIC PANEL
Anion gap: 6 (ref 5–15)
BUN: 11 mg/dL (ref 8–23)
CO2: 30 mmol/L (ref 22–32)
Calcium: 8.2 mg/dL — ABNORMAL LOW (ref 8.9–10.3)
Chloride: 101 mmol/L (ref 98–111)
Creatinine, Ser: 0.56 mg/dL (ref 0.44–1.00)
GFR, Estimated: >60 mL/min (ref >60)
Glucose, Bld: 65 mg/dL — ABNORMAL LOW (ref 70–99)
Potassium: 3.4 mmol/L — ABNORMAL LOW (ref 3.5–5.1)
Sodium: 137 mmol/L (ref 135–145)

## 2021-05-20 LAB — GLUCOSE, CAPILLARY
Glucose-Capillary: 89 mg/dL (ref 70–99)
Glucose-Capillary: 121 mg/dL — ABNORMAL HIGH (ref 70–99)
Glucose-Capillary: 179 mg/dL — ABNORMAL HIGH (ref 70–99)

## 2021-05-20 MED ORDER — GUAIFENESIN-DM 100-10 MG/5ML PO SYRP: 5.0000 mL | ORAL_SOLUTION | ORAL | 0 refills | Status: DC | PRN

## 2021-05-20 MED ORDER — CLONAZEPAM 0.5 MG PO TABS
0.5000 mg | ORAL_TABLET | Freq: Three times a day (TID) | ORAL | 0 refills | Status: DC | PRN
Start: 2021-05-20 — End: 2021-10-13

## 2021-05-20 MED ORDER — GABAPENTIN 300 MG PO CAPS: 300.0000 mg | ORAL_CAPSULE | Freq: Three times a day (TID) | ORAL | Status: DC | PRN

## 2021-05-20 MED ORDER — AMOXICILLIN-POT CLAVULANATE 875-125 MG PO TABS: 1.0000 | ORAL_TABLET | Freq: Two times a day (BID) | ORAL | 0 refills | Status: AC

## 2021-05-20 MED ORDER — POTASSIUM CHLORIDE CRYS ER 20 MEQ PO TBCR
40.0000 meq | EXTENDED_RELEASE_TABLET | Freq: Once | ORAL | Status: AC
  Administered 2021-05-20: 40 meq via ORAL
  Filled 2021-05-20: qty 2

## 2021-05-20 MED ORDER — ONDANSETRON HCL 4 MG PO TABS: 4.0000 mg | ORAL_TABLET | Freq: Two times a day (BID) | ORAL | Status: DC | PRN

## 2021-05-20 MED ORDER — HYDROCODONE-ACETAMINOPHEN 10-325 MG PO TABS: 1.0000 | ORAL_TABLET | Freq: Three times a day (TID) | ORAL | 0 refills | Status: DC | PRN

## 2021-05-20 MED ORDER — ALBUTEROL SULFATE HFA 108 (90 BASE) MCG/ACT IN AERS: 2.0000 | INHALATION_SPRAY | Freq: Four times a day (QID) | RESPIRATORY_TRACT | 1 refills | Status: AC | PRN

## 2021-05-20 MED ORDER — LEVEMIR FLEXTOUCH 100 UNIT/ML ~~LOC~~ SOPN
22.0000 [IU] | PEN_INJECTOR | Freq: Every day | SUBCUTANEOUS | Status: DC
Start: 2021-05-20 — End: 2021-07-15

## 2021-05-20 NOTE — TOC Transition Note (Signed)
Transition of Care (TOC) - CM/SW Discharge Note ? ? ?Patient Details  ?Name: [name] ?MRN: [id number] ?Date of Birth: 12/08/1945 ? ?Transition of Care (TOC) CM/SW Contact:  ?Vasilisa Vore D, LCSW ?Phone Number: ?05/20/2021, 11:33 AM ? ? ?Clinical Narrative:    ?Dc clinicals sent to facility. Facility will pick patient up once o2 has been delivered.  ?TOC signing off.  ? ? ?Final next level of care: Assisted Living ?Barriers to Discharge: No Barriers Identified ? ? ?Patient Goals and CMS Choice ?Patient states their goals for this hospitalization and ongoing recovery are:: return to facility ?  ?  ? ?Discharge Placement ?  ?           ?  ?Patient to be transferred to facility by: facility ?Name of family member notified: message left for [name], legal guardian ?Patient and family notified of of transfer: 05/20/21 ? ?Discharge Plan and Services ?  ?  ?           ?DME Arranged: Oxygen ?  ?Date DME Agency Contacted: 05/20/21 ?Time DME Agency Contacted: [phone] ?Representative spoke with at DME Agency: Theadora Rama ?  ?  ?  ?  ?  ? ?Social Determinants of Health (SDOH) Interventions ?  ? ? ?Readmission Risk Interventions ? ?  03/29/2021  ? 10:51 AM 03/23/2021  ?  2:15 PM  ?Readmission Risk Prevention Plan  ?Transportation Screening Complete Complete  ?[place] or Home Care Consult  Complete  ?Social Work Consult for [place] Planning/Counseling  Complete  ?Palliative Care Screening  Not Applicable  ?Medication Review Press photographer)  Complete  ?[place] or Home Care Consult Complete   ?SW Recovery Care/Counseling Consult Complete   ?Palliative Care Screening Not Applicable   ?Skilled Nursing Facility Complete   ? ? ? ? ? ?

## 2021-05-20 NOTE — Discharge Summary (Signed)
?Physician Discharge Summary ?  ?Patient: [name]: [id number] DOB: 16-Jun-1945  ?Admit date:     05/18/2021  ?Discharge date: 05/20/21  ?Discharge Physician: Barton Dubois  ? ?PCP: Carrolyn Meiers, MD  ? ?Recommendations at discharge:  ?Repeat CBC to follow-up hemoglobin stability and WBCs trend/complete resolution. ?Repeat basic metabolic panel to follow electrolytes and renal function ?Repeat chest x-ray in 6-8 weeks to assure complete resolution of infiltrates. ?Reassessed oxygen saturation and determine the need for further supplementation. ? ?Discharge Diagnoses: ?Principal Problem: ?  Multifocal pneumonia ?Active Problems: ?  Uncontrolled type 2 diabetes mellitus with hyperglycemia, with long-term current use of insulin ([place]) ?  Essential hypertension, benign ?  DVT, lower extremity, recurrent ([place]) ?  Chronic superficial gastritis without bleeding ?  History of cerebrovascular accident (CVA) with residual deficit ?  Paroxysmal atrial fibrillation (HCC) ?  Mood disorder ([place]) ? ?Hospital Course: ?[name] is a 76 y.o. female with medical history significant of paroxysmal atrial fibrillation, anxiety/depression, hypertension, orthostatic hypotension, prior history of stroke with left-sided residual deficits; type 2 diabetes mellitus, hyperlipidemia, history of recurrent lower extremity DVTs, chronic opiate usage and gastroesophageal reflux disease; who presented from her skilled nursing facility secondary to shortness of breath.  Patient reports symptom has been present for the last 4 days or so and worsening.  On today's evaluation she was found to be mildly hypoxic on room air with oxygen saturation of 86-87%.  No fever has been with ported but patient expressed chills.  Coughing spells has been mainly nonproductive there has not been any signs of hemoptysis.  Patient denies chest pain, nausea, vomiting, dysuria, hematuria, melena or hematochezia. ?  ?In the ED work-up demonstrated  negative COVID PCR, chest x-ray/CT of the chest with findings of multifocal infiltrate/pneumonia atelectatic changes.  Patient required 1-2 L nasal cannula supplementation to maintain saturation above 90%. ?  ?Curb 65 score 2. ? ?Assessment and Plan: ?* Multifocal pneumonia ?-Appreciated on chest x-ray and CT chest at time of admission. ?-Pulmonary embolism ruled out. ?-Patient with oxygen saturation in the 86 to 87% on room air and still requiring 2 L to maintain O2 sats percent above 90%. ?-Patient curb 65 score 2 ?-Continue current antibiotics ?-Continue the use of flutter valve and supportive care. ?-As needed bronchodilator has been added. ?-Follow clinical response. ? ?Mood disorder ([place]) ?-Continue home antidepressant and anxiolytic regimen. ?-No agitation or hallucinations currently appreciated on exam. ? ?Paroxysmal atrial fibrillation ([place]) ?-Rate controlled; irregular rhythm appreciated. ?-Continue metoprolol, Cardizem and amiodarone. ?-Continue Eliquis for secondary prevention.   ?-Continue telemetry monitoring and follow electrolytes/replete as needed. ? ? ?History of cerebrovascular accident (CVA) with residual deficit ?-No focal deficits appreciated ?-Continue risk factor modifications. ?-Patient currently on Eliquis for secondary prevention. ? ?Chronic superficial gastritis without bleeding ?-With history of gastroesophageal reflux disease.  ?-Continue the use of PPI. ? ?DVT, lower extremity, recurrent ([place]) ?-Patient chronically on Eliquis ?-Continue current anticoagulation therapy. ? ?Essential hypertension, benign ?-Stable overall ?-Continue home antihypertensive regimen. ?-Follow vital signs. ? ?Uncontrolled type 2 diabetes mellitus with hyperglycemia, with long-term current use of insulin ([place]) ?-Most recent A1c 7.1 continue sliding scale insulin and resumption of home Levemir (adjusted dose while inpatient). ?-Follow CBGs fluctuation adjust hypoglycemic coverage and adjust hypoglycemic regimen  as required. ?-Mild hypoglycemic event appreciated this morning after no eating much overnight. ? ? ?Consultants: None ?Procedures performed: Below for x-ray reports. ?Disposition: Assisted living ?Diet recommendation: Cardiac and Carb modified diet ? ?DISCHARGE MEDICATION: ?Allergies as of  05/20/2021   ? ?   Reactions  ? Sulfa Antibiotics Rash  ? ?  ? ?  ?Medication List  ?  ? ?TAKE these medications   ? ?Abilify 2 MG tablet ?Generic drug: ARIPiprazole ?Take 1 tablet (2 mg total) by mouth daily. ?What changed: when to take this ?  ?acetaminophen 325 MG tablet ?Commonly known as: TYLENOL ?Take 650 mg by mouth 3 (three) times daily as needed for mild pain or moderate pain. ?  ?albuterol 108 (90 Base) MCG/ACT inhaler ?Commonly known as: VENTOLIN HFA ?Inhale 2 puffs into the lungs every 6 (six) hours as needed for wheezing or shortness of breath. ?  ?amiodarone 200 MG tablet ?Commonly known as: PACERONE ?Take 1 tablet (200 mg total) by mouth daily. ?  ?amoxicillin-clavulanate 875-125 MG tablet ?Commonly known as: Augmentin ?Take 1 tablet by mouth 2 (two) times daily for 14 doses. ?  ?apixaban 5 MG Tabs tablet ?Commonly known as: ELIQUIS ?Take 1 tablet (5 mg total) by mouth 2 (two) times daily. ?  ?ARTIFICIAL TEARS OP ?Place 1 drop into both eyes 3 (three) times daily. ?  ?atorvastatin 40 MG tablet ?Commonly known as: LIPITOR ?Take 1 tablet by mouth at bedtime. ?  ?bisacodyl 5 MG EC tablet ?Commonly known as: Dulcolax ?Take 1 tablet (5 mg total) by mouth daily as needed for moderate constipation or mild constipation. ?  ?calcium carbonate 500 MG chewable tablet ?Commonly known as: TUMS - dosed in mg elemental calcium ?Chew 1 tablet by mouth 4 (four) times daily as needed for heartburn. ?  ?clonazePAM 0.5 MG tablet ?Commonly known as: KLONOPIN ?Take 1 tablet (0.5 mg total) by mouth 3 (three) times daily as needed for anxiety. ?  ?diltiazem 120 MG 24 hr capsule ?Commonly known as: CARDIZEM CD ?Take 120 mg by mouth daily. ?   ?DULoxetine 60 MG capsule ?Commonly known as: CYMBALTA ?Take 120 mg by mouth daily. ?  ?gabapentin 300 MG capsule ?Commonly known as: NEURONTIN ?Take 1 capsule (300 mg total) by mouth every 8 (eight) hours as needed (pain). ?  ?guaiFENesin-dextromethorphan 100-10 MG/5ML syrup ?Commonly known as: ROBITUSSIN DM ?Take 5 mLs by mouth every 4 (four) hours as needed for cough. ?  ?HYDROcodone-acetaminophen 10-325 MG tablet ?Commonly known as: NORCO ?Take 1 tablet by mouth every 8 (eight) hours as needed for severe pain. ?What changed: when to take this ?  ?latanoprost 0.005 % ophthalmic solution ?Commonly known as: XALATAN ?Place 1 drop into both eyes at bedtime. ?  ?Levemir FlexTouch 100 UNIT/ML FlexPen ?Generic drug: insulin detemir ?Inject 22 Units into the skin at bedtime. ?What changed: how much to take ?  ?loratadine 10 MG tablet ?Commonly known as: CLARITIN ?Take 10 mg by mouth daily. ?  ?metoprolol succinate 50 MG 24 hr tablet ?Commonly known as: TOPROL-XL ?Take 1 tablet (50 mg total) by mouth daily. Take with or immediately following a meal. ?  ?midodrine 10 MG tablet ?Commonly known as: PROAMATINE ?Take 10 mg by mouth 3 (three) times daily. ?  ?mirtazapine 30 MG tablet ?Commonly known as: REMERON ?Take 30 mg by mouth at bedtime. ?  ?naloxegol oxalate 12.5 MG Tabs tablet ?Commonly known as: MOVANTIK ?Take 1 tablet (12.5 mg total) by mouth daily. ?  ?NovoLOG FlexPen 100 UNIT/ML FlexPen ?Generic drug: insulin aspart ?INJECT SUBCUTANEOUSLY AS FOLLOWS WITH MEALS: 90-150=10u: 151-200=11u: 201-250=12u: 251-300=13u: 301-350=14u: 351-400=15u: BS>400=16u & CALL MD. ?What changed: See the new instructions. ?  ?ondansetron 4 MG tablet ?Commonly known as: ZOFRAN ?Take 1 tablet (  4 mg total) by mouth every 12 (twelve) hours as needed for nausea or vomiting. ?  ?oxybutynin 5 MG 24 hr tablet ?Commonly known as: DITROPAN-XL ?Take 5 mg by mouth daily. ?  ?pantoprazole 40 MG tablet ?Commonly known as: PROTONIX ?Take 1 tablet (40  mg total) by mouth 2 (two) times daily before a meal. ?  ?polyethylene glycol 17 g packet ?Commonly known as: MIRALAX / GLYCOLAX ?Take 17 g by mouth daily. ?What changed:  ?when to take this ?reasons to

## 2021-05-20 NOTE — NC FL2 (Signed)
?[place] MEDICAID FL2 LEVEL OF CARE SCREENING TOOL  ?  ? ?IDENTIFICATION  ?Patient Name: ?[name] Birthdate: [birth year] Sex: female Admission Date (Current Location): ?05/18/2021  ?[place] and [place] Number: [place] ?[id number] [place] and Address:  ?[facility] [address] [place] ?     Provider Number: ?[id number]  ?Attending Physician Name and Address:  ?Barton Dubois, MD ? Relative Name and Phone Number:  ?[name] (Other)   [phone] ?   ?Current Level of Care: ?Hospital Recommended Level of Care: ?Assisted Living Facility Prior Approval Number: ?  ? ?Date Approved/Denied: ?  PASRR Number: ?  ? ?Discharge Plan: ?Domiciliary (Rest home) ?  ? ?Current Diagnoses: ?Patient Active Problem List  ? Diagnosis Date Noted  ? Mood disorder ([place]) 05/18/2021  ? Multifocal pneumonia   ? Fecal impaction ([place]) 04/23/2021  ? Edema of right forearm   ? Acute respiratory failure with hypoxia ([place]) 03/22/2021  ? Lobar pneumonia ([place]) 03/22/2021  ? Sepsis due to undetermined organism ([place]) 03/22/2021  ? Paroxysmal atrial fibrillation ([place]) 03/22/2021  ? Nausea without vomiting 11/17/2020  ? Hyperthyroidism   ? Hypokalemia   ? SBO (small bowel obstruction) ([place]) 04/18/2020  ? Chest pain 02/21/2020  ? Hemorrhoids 11/26/2019  ? Atrial fibrillation with RVR ([place]) 10/04/2019  ? GERD (gastroesophageal reflux disease) 08/26/2019  ? Loss of weight 08/26/2019  ? Constipation 09/09/2018  ? Hypotension 03/21/2018  ? Diarrhea 07/20/2017  ? Primary osteoarthritis of right hip 04/18/2017  ? Orthostasis 03/28/2017  ? General weakness 03/28/2017  ? History of cerebrovascular accident (CVA) with residual deficit 03/28/2017  ? Schizophrenia ([place]) 03/28/2017  ? Generalized weakness 03/28/2017  ? Orthostatic hypotension   ? IBS (irritable bowel syndrome) 10/16/2016  ? Left hemiparesis ([place]) 10/06/2016  ? Left-sided weakness 10/06/2016  ? Chronic superficial gastritis without bleeding   ? Stricture  and stenosis of esophagus   ? Nausea with vomiting 04/20/2016  ? Abdominal pain, acute, bilateral lower quadrant 03/22/2016  ? Lesion of right lobe of liver 03/22/2016  ? Elevated troponin 02/13/2016  ? IDA (iron deficiency anemia) 04/06/2015  ? Mixed hyperlipidemia 11/06/2014  ? Obesity due to excess calories 11/06/2014  ? Sedentary lifestyle 11/06/2014  ? Rectal bleeding 10/21/2014  ? Uncontrolled type 2 diabetes mellitus with hyperglycemia, with long-term current use of insulin ([place]) 03/24/2010  ? Essential hypertension, benign 03/24/2010  ? DVT, lower extremity, recurrent ([place]) 03/24/2010  ? History of cardiovascular disorder 03/24/2010  ? ? ?Orientation RESPIRATION BLADDER Height & Weight   ?  ?Self, Time, Place ? O2 (2L) Incontinent Weight: 147 lb 7.8 oz (66.9 kg) ?Height:  '5\' 6"'$  (167.6 cm)  ?BEHAVIORAL SYMPTOMS/MOOD NEUROLOGICAL BOWEL NUTRITION STATUS  ?    Incontinent Diet (low sodium heart healthy which facility indicates equates to their regular no added table salt diet.)  ?AMBULATORY STATUS COMMUNICATION OF NEEDS Skin   ?Limited Assist Verbally Normal ?  ?  ?  ?    ?     ?     ? ? ?Personal Care Assistance Level of Assistance  ?Bathing, Feeding, Dressing Bathing Assistance: Limited assistance ?Feeding assistance: Independent ?Dressing Assistance: Limited assistance ?   ? ?Functional Limitations Info  ?Sight, Hearing, Speech Sight Info: Adequate ?Hearing Info: Adequate ?Speech Info: Adequate  ? ? ?SPECIAL CARE FACTORS FREQUENCY  ?    ?  ?  ?  ?  ?  ?  ?   ? ? ?Contractures Contractures Info: Not present  ? ? ?  Additional Factors Info  ?Code Status, Allergies, Psychotropic Code Status Info: Full Code ?Allergies Info: Sulfa Antibiotics ?Psychotropic Info: Abilify, Cymbalta, Risperdal, Klonopin ?  ?  ?   ? ?Current Medications (05/20/2021):  This is the current hospital active medication list ?Current Facility-Administered Medications  ?Medication Dose Route Frequency Provider Last Rate Last Admin  ?  acetaminophen (TYLENOL) tablet 650 mg  650 mg Oral Q6H PRN Barton Dubois, MD      ? Or  ? acetaminophen (TYLENOL) suppository 650 mg  650 mg Rectal Q6H PRN Barton Dubois, MD      ? amiodarone (PACERONE) tablet 200 mg  200 mg Oral Daily Barton Dubois, MD   200 mg at 05/20/21 0857  ? apixaban (ELIQUIS) tablet 5 mg  5 mg Oral BID Barton Dubois, MD   5 mg at 05/20/21 1448  ? ARIPiprazole (ABILIFY) tablet 2 mg  2 mg Oral QHS Barton Dubois, MD   2 mg at 05/19/21 2115  ? atorvastatin (LIPITOR) tablet 40 mg  40 mg Oral QHS Barton Dubois, MD   40 mg at 05/19/21 2116  ? azithromycin (ZITHROMAX) 500 mg in sodium chloride 0.9 % 250 mL IVPB  500 mg Intravenous Q24H Barton Dubois, MD 250 mL/hr at 05/19/21 1052 500 mg at 05/19/21 1052  ? cefTRIAXone (ROCEPHIN) 2 g in sodium chloride 0.9 % 100 mL IVPB  2 g Intravenous Q24H Barton Dubois, MD 200 mL/hr at 05/19/21 1212 2 g at 05/19/21 1212  ? cholecalciferol (VITAMIN D3) tablet 2,000 Units  2,000 Units Oral Daily Barton Dubois, MD   2,000 Units at 05/20/21 0857  ? clonazePAM (KLONOPIN) tablet 0.5 mg  0.5 mg Oral TID PRN Barton Dubois, MD   0.5 mg at 05/20/21 1856  ? diltiazem (CARDIZEM CD) 24 hr capsule 120 mg  120 mg Oral Daily Barton Dubois, MD   120 mg at 05/20/21 0857  ? DULoxetine (CYMBALTA) DR capsule 120 mg  120 mg Oral Daily Barton Dubois, MD   120 mg at 05/20/21 0857  ? gabapentin (NEURONTIN) capsule 300 mg  300 mg Oral Q8H PRN Barton Dubois, MD   300 mg at 05/19/21 2116  ? guaiFENesin-dextromethorphan (ROBITUSSIN DM) 100-10 MG/5ML syrup 5 mL  5 mL Oral Q4H PRN Barton Dubois, MD   5 mL at 05/20/21 3149  ? HYDROcodone-acetaminophen (NORCO) 10-325 MG per tablet 1 tablet  1 tablet Oral TID PRN Barton Dubois, MD   1 tablet at 05/20/21 7026  ? insulin aspart (novoLOG) injection 0-15 Units  0-15 Units Subcutaneous TID WC Barton Dubois, MD   2 Units at 05/20/21 [phone]  ? insulin aspart (novoLOG) injection 0-5 Units  0-5 Units Subcutaneous QHS Barton Dubois, MD      ?  insulin detemir (LEVEMIR) injection 28 Units  28 Units Subcutaneous QHS Barton Dubois, MD   28 Units at 05/19/21 2114  ? ipratropium-albuterol (DUONEB) 0.5-2.5 (3) MG/3ML nebulizer solution 3 mL  3 mL Nebulization Q6H PRN Barton Dubois, MD      ? latanoprost (XALATAN) 0.005 % ophthalmic solution 1 drop  1 drop Both Eyes QHS Barton Dubois, MD   1 drop at 05/19/21 2116  ? loratadine (CLARITIN) tablet 10 mg  10 mg Oral Daily Barton Dubois, MD   10 mg at 05/20/21 0857  ? metoprolol succinate (TOPROL-XL) 24 hr tablet 50 mg  50 mg Oral Daily Barton Dubois, MD   50 mg at 05/20/21 0857  ? midodrine (PROAMATINE) tablet 10 mg  10 mg Oral TID Barton Dubois,  MD   10 mg at 05/20/21 0857  ? mirtazapine (REMERON) tablet 30 mg  30 mg Oral QHS Barton Dubois, MD   30 mg at 05/19/21 2115  ? ondansetron [facility]) tablet 4 mg  4 mg Oral Q6H PRN Barton Dubois, MD      ? Or  ? ondansetron [facility]) injection 4 mg  4 mg Intravenous Q6H PRN Barton Dubois, MD      ? oxybutynin (DITROPAN-XL) 24 hr tablet 5 mg  5 mg Oral Daily Barton Dubois, MD   5 mg at 05/20/21 0857  ? pantoprazole (PROTONIX) EC tablet 40 mg  40 mg Oral BID Maebelle Munroe, MD   40 mg at 05/20/21 0857  ? polyethylene glycol (MIRALAX / GLYCOLAX) packet 17 g  17 g Oral Daily PRN Barton Dubois, MD      ? polyvinyl alcohol (LIQUIFILM TEARS) 1.4 % ophthalmic solution 2 drop  2 drop Both Eyes TID Barton Dubois, MD   2 drop at 05/20/21 0859  ? risperiDONE (RISPERDAL) tablet 1 mg  1 mg Oral Daily Barton Dubois, MD   1 mg at 05/20/21 0857  ? risperiDONE (RISPERDAL) tablet 2 mg  2 mg Oral QHS Barton Dubois, MD   2 mg at 05/19/21 2115  ? senna (SENOKOT) tablet 8.6 mg  1 tablet Oral Daily Barton Dubois, MD   8.6 mg at 05/20/21 1594  ? ? ? ?Discharge Medications: ?Medication List  ?   ?  ?TAKE these medications   ?  ?Abilify 2 MG tablet ?Generic drug: ARIPiprazole ?Take 1 tablet (2 mg total) by mouth daily. ?What changed: when to take this ?   ?acetaminophen 325 MG  tablet ?Commonly known as: TYLENOL ?Take 650 mg by mouth 3 (three) times daily as needed for mild pain or moderate pain. ?   ?albuterol 108 (90 Base) MCG/ACT inhaler ?Commonly known as: VENTOLIN HFA ?Inhale 2 puffs i

## 2021-05-20 NOTE — Care Management Important Message (Signed)
Important Message ? ?Patient Details  ?Name: [name] ?MRN: [id number] ?Date of Birth: 12-Sep-1945 ? ? ?Medicare Important Message Given:  Yes ? ? ? ? ?[name] ?05/20/2021, 1:29 PM ?

## 2021-05-29 ENCOUNTER — Emergency Department (HOSPITAL_COMMUNITY)
Admission: EM | Admit: 2021-05-29 | Discharge: 2021-05-30 | Disposition: A | Discharge status: Skilled Nursing Facility | Payer: Medicare Other | Attending: Emergency Medicine | Admitting: Emergency Medicine

## 2021-05-29 ENCOUNTER — Emergency Department (HOSPITAL_COMMUNITY): Payer: Medicare Other

## 2021-05-29 ENCOUNTER — Other Ambulatory Visit: Payer: Self-pay

## 2021-05-29 ENCOUNTER — Encounter (HOSPITAL_COMMUNITY): Payer: Self-pay | Admitting: Emergency Medicine

## 2021-05-29 DIAGNOSIS — Z7901 Long term (current) use of anticoagulants: Secondary | ICD-10-CM | POA: Diagnosis not present

## 2021-05-29 DIAGNOSIS — S0990XA Unspecified injury of head, initial encounter: Secondary | ICD-10-CM | POA: Diagnosis present

## 2021-05-29 DIAGNOSIS — S0083XA Contusion of other part of head, initial encounter: Secondary | ICD-10-CM

## 2021-05-29 DIAGNOSIS — W01198A Fall on same level from slipping, tripping and stumbling with subsequent striking against other object, initial encounter: Secondary | ICD-10-CM | POA: Insufficient documentation

## 2021-05-29 DIAGNOSIS — E119 Type 2 diabetes mellitus without complications: Secondary | ICD-10-CM | POA: Insufficient documentation

## 2021-05-29 DIAGNOSIS — Z794 Long term (current) use of insulin: Secondary | ICD-10-CM | POA: Diagnosis not present

## 2021-05-29 NOTE — ED Notes (Signed)
Patient transported to CT 

## 2021-05-29 NOTE — ED Notes (Signed)
[facility] notified of patient needing transportation back to [facility]. ?

## 2021-05-29 NOTE — ED Provider Notes (Signed)
?[facility] ?Provider Note ? ? ?CSN: [id number] ?Arrival date & time: 05/29/21  1951 ? ?  ? ?History ? ?Chief Complaint  ?Patient presents with  ? Fall  ? ? ?[name] is a 76 y.o. female. ? ? ?Fall ? ?This patient is a 76 year old female, she is a known insulin requiring diabetic, she has a history of anxiety for which she takes clonazepam, she is on amiodarone, risperidone, Tradjenta, metoprolol, NovoLog, Abilify, Cardizem and Eliquis.  The patient had an unwitnessed fall this evening where she states that she struck her head on the table and fell to the ground.  She was sent in for evaluation from high [facility] facility.  She states she has a mild headache but denies any other symptoms including neck pain back pain chest pain shortness of breath or weakness.  No changes in vision.  Evidently this occurred just prior to arrival, this was unwitnessed by the staff.  The patient had refused to be transported to the hospital but they insisted given this is the policy of the facility ?  ? ?Home Medications ?Prior to Admission medications   ?Medication Sig Start Date End Date Taking? Authorizing Provider  ?ABILIFY 2 MG tablet Take 1 tablet (2 mg total) by mouth daily. ?Patient taking differently: Take 2 mg by mouth at bedtime. 11/09/20 05/18/21  [name], MD  ?acetaminophen (TYLENOL) 325 MG tablet Take 650 mg by mouth 3 (three) times daily as needed for mild pain or moderate pain.     [provider]  ?albuterol (VENTOLIN HFA) 108 (90 Base) MCG/ACT inhaler Inhale 2 puffs into the lungs every 6 (six) hours as needed for wheezing or shortness of breath. 05/20/21   Barton Dubois, MD  ?amiodarone (PACERONE) 200 MG tablet Take 1 tablet (200 mg total) by mouth daily. 04/24/21   Barton Dubois, MD  ?apixaban (ELIQUIS) 5 MG TABS tablet Take 1 tablet (5 mg total) by mouth 2 (two) times daily. 02/21/20   Richarda Osmond, MD  ?atorvastatin (LIPITOR) 40 MG tablet Take 1 tablet by mouth at  bedtime. 05/30/20   [provider]  ?bisacodyl (DULCOLAX) 5 MG EC tablet Take 1 tablet (5 mg total) by mouth daily as needed for moderate constipation or mild constipation. 04/24/21 04/24/22  Barton Dubois, MD  ?calcium carbonate (TUMS - DOSED IN MG ELEMENTAL CALCIUM) 500 MG chewable tablet Chew 1 tablet by mouth 4 (four) times daily as needed for heartburn.    [provider]  ?Carboxymethylcellulose Sodium (ARTIFICIAL TEARS OP) Place 1 drop into both eyes 3 (three) times daily.    [provider]  ?Cholecalciferol (VITAMIN D3) 50 MCG (2000 UT) TABS Take 2,000 Units by mouth daily.    [provider]  ?clonazePAM (KLONOPIN) 0.5 MG tablet Take 1 tablet (0.5 mg total) by mouth 3 (three) times daily as needed for anxiety. 05/20/21   Barton Dubois, MD  ?diltiazem (CARDIZEM CD) 120 MG 24 hr capsule Take 120 mg by mouth daily. 05/30/20   [provider]  ?DULoxetine (CYMBALTA) 60 MG capsule Take 120 mg by mouth daily.    [provider]  ?gabapentin (NEURONTIN) 300 MG capsule Take 1 capsule (300 mg total) by mouth every 8 (eight) hours as needed (pain). 05/20/21   Barton Dubois, MD  ?guaiFENesin-dextromethorphan [facility] DM) 100-10 MG/5ML syrup Take 5 mLs by mouth every 4 (four) hours as needed for cough. 05/20/21   Barton Dubois, MD  ?HYDROcodone-acetaminophen [facility]) 10-325 MG tablet Take 1  tablet by mouth every 8 (eight) hours as needed for severe pain. 05/20/21   Barton Dubois, MD  ?insulin detemir (LEVEMIR FLEXTOUCH) 100 UNIT/ML FlexPen Inject 22 Units into the skin at bedtime. 05/20/21   Barton Dubois, MD  ?latanoprost (XALATAN) 0.005 % ophthalmic solution Place 1 drop into both eyes at bedtime.    [provider]  ?loratadine (CLARITIN) 10 MG tablet Take 10 mg by mouth daily.    [provider]  ?metoprolol succinate (TOPROL-XL) 50 MG 24 hr tablet Take 1 tablet (50 mg total) by mouth daily. Take with or immediately following a meal. 04/01/21    Tat, Shanon Brow, MD  ?midodrine (PROAMATINE) 10 MG tablet Take 10 mg by mouth 3 (three) times daily.    [provider]  ?mirtazapine (REMERON) 30 MG tablet Take 30 mg by mouth at bedtime.    [provider]  ?naloxegol oxalate (MOVANTIK) 12.5 MG TABS tablet Take 1 tablet (12.5 mg total) by mouth daily. 04/24/21   Barton Dubois, MD  ?NOVOLOG FLEXPEN 100 UNIT/ML FlexPen INJECT SUBCUTANEOUSLY AS FOLLOWS WITH MEALS: 90-150=10u: 151-200=11u: 201-250=12u: 251-300=13u: 301-350=14u: 351-400=15u: BS>400=16u & CALL MD. ?Patient taking differently: 10-16 Units 3 (three) times daily with meals. INJECT SUBCUTANEOUSLY AS FOLLOWS WITH MEALS: 90-150=10u: 151-200=11u: 201-250=12u: 251-300=13u: 301-350=14u: 351-400=15u: BS>400=16u & CALL MD. 02/28/21   Brita Romp, NP  ?ondansetron (ZOFRAN) 4 MG tablet Take 1 tablet (4 mg total) by mouth every 12 (twelve) hours as needed for nausea or vomiting. 05/20/21   Barton Dubois, MD  ?oxybutynin (DITROPAN-XL) 5 MG 24 hr tablet Take 5 mg by mouth daily. 04/20/21   [provider]  ?pantoprazole (PROTONIX) 40 MG tablet Take 1 tablet (40 mg total) by mouth 2 (two) times daily before a meal. 11/17/20   Mahala Menghini, PA-C  ?polyethylene glycol (MIRALAX / GLYCOLAX) 17 g packet Take 17 g by mouth daily. ?Patient taking differently: Take 17 g by mouth daily as needed for mild constipation. 04/24/21   Barton Dubois, MD  ?risperiDONE (RISPERDAL) 1 MG tablet Take 1-2 mg by mouth 2 (two) times daily. Take 1 tablet by mouth in the morning. Take 2 tablets by mouth at bedtime. 04/04/21   [provider]  ?senna (SENOKOT) 8.6 MG tablet Take 1 tablet by mouth daily.    [provider]  ?TRADJENTA 5 MG TABS tablet TAKE (1) TABLET BY MOUTH ONCE DAILY. ?Patient taking differently: Take 5 mg by mouth daily. 04/04/21   Brita Romp, NP  ?   ? ?Allergies    ?Sulfa antibiotics   ? ?Review of Systems   ?Review of Systems  ?All other systems reviewed and are  negative. ? ?Physical Exam ?Updated Vital Signs ?BP 107/67   Pulse (!) 52   Temp 98.6 ?F (37 ?C)   Resp 18   Ht 1.676 m ('5\' 6"'$ )   Wt 67 kg   SpO2 97%   BMI 23.84 kg/m?  ?Physical Exam ?Vitals and nursing note reviewed.  ?Constitutional:   ?   General: She is not in acute distress. ?   Appearance: She is well-developed.  ?HENT:  ?   Head: Normocephalic.  ?   Comments: 3 cm hematoma to the mid forehead ?   Mouth/Throat:  ?   Pharynx: No oropharyngeal exudate.  ?Eyes:  ?   General: No scleral icterus.    ?   Right eye: No discharge.     ?   Left eye: No discharge.  ?   Conjunctiva/sclera: Conjunctivae  normal.  ?   Pupils: Pupils are equal, round, and reactive to light.  ?Neck:  ?   Thyroid: No thyromegaly.  ?   Vascular: No JVD.  ?Cardiovascular:  ?   Rate and Rhythm: Normal rate and regular rhythm.  ?   Heart sounds: Normal heart sounds. No murmur heard. ?  No friction rub. No gallop.  ?Pulmonary:  ?   Effort: Pulmonary effort is normal. No respiratory distress.  ?   Breath sounds: Normal breath sounds. No wheezing or rales.  ?Abdominal:  ?   General: Bowel sounds are normal. There is no distension.  ?   Palpations: Abdomen is soft. There is no mass.  ?   Tenderness: There is no abdominal tenderness.  ?Musculoskeletal:     ?   General: No tenderness. Normal range of motion.  ?   Cervical back: Normal range of motion and neck supple.  ?   Comments: No tenderness over the cervical or thoracic spines, no rib tenderness, moves all 4 extremities with normal range of motion, soft compartments and supple joints diffusely  ?Lymphadenopathy:  ?   Cervical: No cervical adenopathy.  ?Skin: ?   General: Skin is warm and dry.  ?   Findings: No erythema or rash.  ?Neurological:  ?   Mental Status: She is alert.  ?   Coordination: Coordination normal.  ?   Comments: Awake alert able to answer my questions, moves all 4 extremities, generally weak  ?Psychiatric:     ?   Behavior: Behavior normal.  ? ? ?ED Results / Procedures /  Treatments   ?Labs ?(all labs ordered are listed, but only abnormal results are displayed) ?Labs Reviewed - No data to display ? ?EKG ?None ? ?Radiology ?CT Head Wo Contrast ? ?Result Date: 05/29/2021 ?CLINICAL DATA:  Leighton Roach

## 2021-05-29 NOTE — Discharge Instructions (Signed)
You may apply ice packs intermittently for the swelling of the forehead, there is no signs of brain injury, Tylenol as needed for pain, see your doctor in 48 hours for recheck if still having symptoms ?

## 2021-05-29 NOTE — ED Triage Notes (Addendum)
Pt from [facility] after unwitnessed fall. Per EMS, pt denied any injury or LOC and did not want to come but facility insisted pt be seen for evaluation. When questioning pt upon arrival to ED, pt states she hit her head on a table when she fell. Pt has swelling and slight bruising to lower forehead. Pt also c/o headache. ?

## 2021-06-08 ENCOUNTER — Ambulatory Visit (INDEPENDENT_AMBULATORY_CARE_PROVIDER_SITE_OTHER): Payer: Medicare Other | Admitting: Vascular Surgery

## 2021-06-08 ENCOUNTER — Encounter: Payer: Self-pay | Admitting: Vascular Surgery

## 2021-06-08 VITALS — BP 104/58 | HR 80 | Temp 97.3°F | Resp 20 | Ht 66.0 in | Wt 147.0 lb

## 2021-06-08 DIAGNOSIS — M25562 Pain in left knee: Secondary | ICD-10-CM

## 2021-06-08 NOTE — Progress Notes (Signed)
? ?Patient ID: [name], female   DOB: [birth year], 76 y.o.   MRN: [id number] ? ?Reason for Consult: No chief complaint on file. ?  ?Referred by [name] Demissie* ? ?Subjective:  ?   ?HPI: ? ?[name] is a 76 y.o. female recently evaluated in the emergency department for left foot pain.  She states this has been ongoing for over a month.  Patient does not walk.  She is mostly confined to a wheelchair at the nursing facility.  She does not have any tissue loss or ulceration.  She denies any history of lower extremity intervention.  She does have a known history of a DVT in the left lower extremity in the past for which she takes anticoagulation.  She does not have any underlying coronary artery disease or peripheral vascular disease that is known. ? ?Past Medical History:  ?Diagnosis Date  ? Anxiety   ? Arthritis   ? Atrial fibrillation ([place])   ? Atrial flutter ([place])   ? Collagen vascular disease ([place])   ? COVID-19 virus infection   ? COVID-19+ approx 01/24/19; asymptomatic course with full recovery  ? Dependence on wheelchair   ? pivot/transfers  ? Depression   ? History of psychosis and previous suicide attempt  ? DVT, lower extremity, recurrent ([place])   ? Long-term Coumadin per Dr. Legrand Rams  ? Essential hypertension   ? GERD (gastroesophageal reflux disease)   ? Hemiplegia ([place]) 2010  ? Left side  ? History of stroke   ? Acute infarct and right cerebral white matter small vessel disease 12/10  ? Leg DVT (deep venous thromboembolism), acute ([place]) 2006  ? Orthostatic hypotension   ? Schizophrenia ([place])   ? Stroke [facility])   ? left sided weakness  ? Type 2 diabetes mellitus ([place])   ? ?Family History  ?Problem Relation Age of Onset  ? Hypertension Mother   ? Colon cancer Neg Hx   ? ?Past Surgical History:  ?Procedure Laterality Date  ? BACK SURGERY    ? BIOPSY N/A 11/24/2014  ? Procedure: BIOPSY;  Surgeon: Danie Binder, MD;  Location: [facility] OR[facility];  Service: Endoscopy;  Laterality: N/A;  ? BIOPSY  05/17/2016  ?  Procedure: BIOPSY;  Surgeon: Danie Binder, MD;  Location: [facility];  Service: Endoscopy;;  gastric biopsy  ? COLONOSCOPY WITH PROPOFOL N/A 11/24/2014  ? Dr. Rudie Meyer polyps removed/moderate sized internal hemorrhoids, tubular adenomas. Next surveillance in 3 years  ? ESOPHAGOGASTRODUODENOSCOPY (EGD) WITH PROPOFOL N/A 11/24/2014  ? Dr. Clayburn Pert HH/patent stricture at the gastroesophageal junction, mild non-erosive gastritis, path negative for H.pylori or celiac sprue  ? ESOPHAGOGASTRODUODENOSCOPY (EGD) WITH PROPOFOL N/A 05/17/2016  ? Procedure: ESOPHAGOGASTRODUODENOSCOPY (EGD) WITH PROPOFOL;  Surgeon: Danie Binder, MD;  Location: [facility];  Service: Endoscopy;  Laterality: N/A;  12:45pm  ? GIVENS CAPSULE STUDY N/A 12/11/2014  ? MULTILPLE EROSION IN the stomach WITH ACTIVE OOZING. OCCASIONAL EROSIONS AND RARE ULCER SEEN IN PROXIMAL SMALL BOWEL . No masses or AVMs SEEN. NO OLD BLOOD OR FRESH BLOOD SEEN.   ? POLYPECTOMY N/A 11/24/2014  ? Procedure: POLYPECTOMY;  Surgeon: Danie Binder, MD;  Location: [facility];  Service: Endoscopy;  Laterality: N/A;  ? SAVORY DILATION N/A 05/17/2016  ? Procedure: SAVORY DILATION;  Surgeon: Danie Binder, MD;  Location: [facility];  Service: Endoscopy;  Laterality: N/A;  ? ? ?Short Social History:  ?Social History  ? ?Tobacco Use  ? Smoking status: Former  ?  Packs/day: 0.25  ?  Years: 20.00  ?  Pack years: 5.00  ?  Types: Cigarettes  ?  Quit date: 01/24/1995  ?  Years since quitting: 26.3  ? Smokeless tobacco: Never  ?Substance Use Topics  ? Alcohol use: No  ?  Alcohol/week: 0.0 standard drinks  ? ? ?Allergies  ?Allergen Reactions  ? Sulfa Antibiotics Rash  ? ? ?Current Outpatient Medications  ?Medication Sig Dispense Refill  ? ABILIFY 2 MG tablet Take 1 tablet (2 mg total) by mouth daily. (Patient taking differently: Take 2 mg by mouth at bedtime.) 30 tablet 5  ? acetaminophen (TYLENOL) 325 MG tablet Take 650 mg by mouth 3 (three) times daily as needed for mild pain or  moderate pain.     ? albuterol (VENTOLIN HFA) 108 (90 Base) MCG/ACT inhaler Inhale 2 puffs into the lungs every 6 (six) hours as needed for wheezing or shortness of breath. 8 g 1  ? amiodarone (PACERONE) 200 MG tablet Take 1 tablet (200 mg total) by mouth daily.    ? apixaban (ELIQUIS) 5 MG TABS tablet Take 1 tablet (5 mg total) by mouth 2 (two) times daily. 60 tablet 0  ? atorvastatin (LIPITOR) 40 MG tablet Take 1 tablet by mouth at bedtime.    ? bisacodyl (DULCOLAX) 5 MG EC tablet Take 1 tablet (5 mg total) by mouth daily as needed for moderate constipation or mild constipation. 30 tablet 1  ? calcium carbonate (TUMS - DOSED IN MG ELEMENTAL CALCIUM) 500 MG chewable tablet Chew 1 tablet by mouth 4 (four) times daily as needed for heartburn.    ? Carboxymethylcellulose Sodium (ARTIFICIAL TEARS OP) Place 1 drop into both eyes 3 (three) times daily.    ? Cholecalciferol (VITAMIN D3) 50 MCG (2000 UT) TABS Take 2,000 Units by mouth daily.    ? clonazePAM (KLONOPIN) 0.5 MG tablet Take 1 tablet (0.5 mg total) by mouth 3 (three) times daily as needed for anxiety. 10 tablet 0  ? diltiazem (CARDIZEM CD) 120 MG 24 hr capsule Take 120 mg by mouth daily.    ? DULoxetine (CYMBALTA) 60 MG capsule Take 120 mg by mouth daily.    ? gabapentin (NEURONTIN) 300 MG capsule Take 1 capsule (300 mg total) by mouth every 8 (eight) hours as needed (pain).    ? guaiFENesin-dextromethorphan (ROBITUSSIN DM) 100-10 MG/5ML syrup Take 5 mLs by mouth every 4 (four) hours as needed for cough. 118 mL 0  ? HYDROcodone-acetaminophen (NORCO) 10-325 MG tablet Take 1 tablet by mouth every 8 (eight) hours as needed for severe pain. 10 tablet 0  ? insulin detemir (LEVEMIR FLEXTOUCH) 100 UNIT/ML FlexPen Inject 22 Units into the skin at bedtime.    ? latanoprost (XALATAN) 0.005 % ophthalmic solution Place 1 drop into both eyes at bedtime.    ? loratadine (CLARITIN) 10 MG tablet Take 10 mg by mouth daily.    ? metoprolol succinate (TOPROL-XL) 50 MG 24 hr  tablet Take 1 tablet (50 mg total) by mouth daily. Take with or immediately following a meal.    ? midodrine (PROAMATINE) 10 MG tablet Take 10 mg by mouth 3 (three) times daily.    ? mirtazapine (REMERON) 30 MG tablet Take 30 mg by mouth at bedtime.    ? naloxegol oxalate (MOVANTIK) 12.5 MG TABS tablet Take 1 tablet (12.5 mg total) by mouth daily. 30 tablet 1  ? NOVOLOG FLEXPEN 100 UNIT/ML FlexPen INJECT SUBCUTANEOUSLY AS FOLLOWS WITH MEALS: 90-150=10u: 151-200=11u: 201-250=12u: 251-300=13u: 301-350=14u: 351-400=15u:  BS>400=16u & CALL MD. (Patient taking differently: 10-16 Units 3 (three) times daily with meals. INJECT SUBCUTANEOUSLY AS FOLLOWS WITH MEALS: 90-150=10u: 151-200=11u: 201-250=12u: 251-300=13u: 301-350=14u: 351-400=15u: BS>400=16u & CALL MD.) 15 mL 0  ? ondansetron (ZOFRAN) 4 MG tablet Take 1 tablet (4 mg total) by mouth every 12 (twelve) hours as needed for nausea or vomiting.    ? oxybutynin (DITROPAN-XL) 5 MG 24 hr tablet Take 5 mg by mouth daily.    ? pantoprazole (PROTONIX) 40 MG tablet Take 1 tablet (40 mg total) by mouth 2 (two) times daily before a meal. 180 tablet 1  ? polyethylene glycol (MIRALAX / GLYCOLAX) 17 g packet Take 17 g by mouth daily. (Patient taking differently: Take 17 g by mouth daily as needed for mild constipation.) 14 each 0  ? risperiDONE (RISPERDAL) 1 MG tablet Take 1-2 mg by mouth 2 (two) times daily. Take 1 tablet by mouth in the morning. Take 2 tablets by mouth at bedtime.    ? senna (SENOKOT) 8.6 MG tablet Take 1 tablet by mouth daily.    ? TRADJENTA 5 MG TABS tablet TAKE (1) TABLET BY MOUTH ONCE DAILY. (Patient taking differently: Take 5 mg by mouth daily.) 30 tablet 3  ? ?No current facility-administered medications for this visit.  ? ? ?Review of Systems  ?Constitutional:  Constitutional negative. ?HENT: HENT negative.  ?Eyes: Eyes negative.  ?Respiratory: Respiratory negative.  ?Cardiovascular: Positive for leg swelling.  ?GI: Gastrointestinal negative.   ?Musculoskeletal: Positive for leg pain.  ?Skin: Skin negative.  ?Neurological: Neurological negative. ?Hematologic: Hematologic/lymphatic negative.  ?Psychiatric: Psychiatric negative.   ? ?   ?Objective:  ?Objective  ?Vit

## 2021-06-08 NOTE — Progress Notes (Signed)
Cardiology Office Note    Date:  06/09/2021   ID:  [name], DOB [birth year], MRN [id number]  PCP:  Carrolyn Meiers, MD  Cardiologist: Rozann Lesches, MD    Chief Complaint  Patient presents with   Follow-up    Overdue Visit    History of Present Illness:    [name] is a 76 y.o. female with past medical history of paroxysmal atrial fibrillation (diagnosed during admission in 09/2019 with recurrence in 03/2020 but back in NSR by office visit in 04/2020), HTN, HLD, IDDM, SVT, history of DVT and history of CVA who presents to the office today for overdue follow-up.  She was last examined by the Cardiology service during an admission in 03/2021 for sepsis in the setting of pneumonia. Cardiology was consulted as she had paroxysmal atrial fibrillation and management was challenging in the setting of orthostatic hypotension. She initially received IV Amiodarone and was discharged on Amiodarone 200 mg twice daily for 7 days then to be on 200 mg once daily. She was continued on Eliquis along with Cardizem CD 120 mg daily and Toprol-XL 50 mg daily. She has not followed up with Cardiology as an outpatient since. She had a recurrent admission in 04/2021 for multifocal pneumonia and was treated with antibiotic therapy. It does not appear that changes were made to her cardiac regimen at that time.  In talking with the patient and one of her caregivers from [facility], she reports having nervousness at baseline and her assistant reports this is typically worse when she is due to take her medications. She denies any recent chest pain or palpitations. Her breathing has been stable with no recent orthopnea, PND or pitting edema. She does experience intermittent lower extremity edema and was recently evaluated by Vascular with the use of lower compression stockings recommended.  She is not on a diuretic due to episodes of orthostatic hypotension and requiring Midodrine.    Past  Medical History:  Diagnosis Date   Anxiety    Arthritis    Atrial fibrillation (HCC)    Atrial flutter ([place])    Collagen vascular disease ([place])    COVID-19 virus infection    COVID-19+ approx 01/24/19; asymptomatic course with full recovery   Dependence on wheelchair    pivot/transfers   Depression    History of psychosis and previous suicide attempt   DVT, lower extremity, recurrent ([place])    Long-term Coumadin per Dr. Legrand Rams   Essential hypertension    GERD (gastroesophageal reflux disease)    Hemiplegia ([place]) 2010   Left side   History of stroke    Acute infarct and right cerebral white matter small vessel disease 12/10   Leg DVT (deep venous thromboembolism), acute ([place]) 2006   Orthostatic hypotension    Schizophrenia ([place])    Stroke ([place])    left sided weakness   Type 2 diabetes mellitus ([place])     Past Surgical History:  Procedure Laterality Date   BACK SURGERY     BIOPSY N/A 11/24/2014   Procedure: BIOPSY;  Surgeon: Danie Binder, MD;  Location: [facility];  Service: Endoscopy;  Laterality: N/A;   BIOPSY  05/17/2016   Procedure: BIOPSY;  Surgeon: Danie Binder, MD;  Location: [facility];  Service: Endoscopy;;  gastric biopsy   COLONOSCOPY WITH PROPOFOL N/A 11/24/2014   Dr. Rudie Meyer polyps removed/moderate sized internal hemorrhoids, tubular adenomas. Next surveillance in 3 years   ESOPHAGOGASTRODUODENOSCOPY (EGD) WITH PROPOFOL N/A 11/24/2014  Dr. Clayburn Pert HH/patent stricture at the gastroesophageal junction, mild non-erosive gastritis, path negative for H.pylori or celiac sprue   ESOPHAGOGASTRODUODENOSCOPY (EGD) WITH PROPOFOL N/A 05/17/2016   Procedure: ESOPHAGOGASTRODUODENOSCOPY (EGD) WITH PROPOFOL;  Surgeon: Danie Binder, MD;  Location: [facility];  Service: Endoscopy;  Laterality: N/A;  12:45pm   GIVENS CAPSULE STUDY N/A 12/11/2014   MULTILPLE EROSION IN the stomach WITH ACTIVE OOZING. OCCASIONAL EROSIONS AND RARE ULCER SEEN IN PROXIMAL SMALL BOWEL . No masses or  AVMs SEEN. NO OLD BLOOD OR FRESH BLOOD SEEN.    POLYPECTOMY N/A 11/24/2014   Procedure: POLYPECTOMY;  Surgeon: Danie Binder, MD;  Location: [facility];  Service: Endoscopy;  Laterality: N/A;   SAVORY DILATION N/A 05/17/2016   Procedure: SAVORY DILATION;  Surgeon: Danie Binder, MD;  Location: [facility];  Service: Endoscopy;  Laterality: N/A;    Current Medications: Outpatient Medications Prior to Visit  Medication Sig Dispense Refill   ABILIFY 2 MG tablet Take 1 tablet (2 mg total) by mouth daily. (Patient taking differently: Take 2 mg by mouth at bedtime.) 30 tablet 5   acetaminophen (TYLENOL) 325 MG tablet Take 650 mg by mouth 3 (three) times daily as needed for mild pain or moderate pain.      albuterol (VENTOLIN HFA) 108 (90 Base) MCG/ACT inhaler Inhale 2 puffs into the lungs every 6 (six) hours as needed for wheezing or shortness of breath. 8 g 1   amiodarone (PACERONE) 200 MG tablet Take 1 tablet (200 mg total) by mouth daily.     apixaban (ELIQUIS) 5 MG TABS tablet Take 1 tablet (5 mg total) by mouth 2 (two) times daily. 60 tablet 0   atorvastatin (LIPITOR) 40 MG tablet Take 1 tablet by mouth at bedtime.     bisacodyl (DULCOLAX) 5 MG EC tablet Take 1 tablet (5 mg total) by mouth daily as needed for moderate constipation or mild constipation. 30 tablet 1   calcium carbonate (TUMS - DOSED IN MG ELEMENTAL CALCIUM) 500 MG chewable tablet Chew 1 tablet by mouth 4 (four) times daily as needed for heartburn.     Carboxymethylcellulose Sodium (ARTIFICIAL TEARS OP) Place 1 drop into both eyes 3 (three) times daily.     Cholecalciferol (VITAMIN D3) 50 MCG (2000 UT) TABS Take 2,000 Units by mouth daily.     diltiazem (CARDIZEM CD) 120 MG 24 hr capsule Take 120 mg by mouth daily.     DULoxetine (CYMBALTA) 60 MG capsule Take 120 mg by mouth daily.     insulin detemir (LEVEMIR FLEXTOUCH) 100 UNIT/ML FlexPen Inject 22 Units into the skin at bedtime.     latanoprost (XALATAN) 0.005 % ophthalmic  solution Place 1 drop into both eyes at bedtime.     loratadine (CLARITIN) 10 MG tablet Take 10 mg by mouth daily.     metoprolol succinate (TOPROL-XL) 50 MG 24 hr tablet Take 1 tablet (50 mg total) by mouth daily. Take with or immediately following a meal.     midodrine (PROAMATINE) 10 MG tablet Take 10 mg by mouth 3 (three) times daily.     mirtazapine (REMERON) 30 MG tablet Take 30 mg by mouth at bedtime.     naloxegol oxalate (MOVANTIK) 12.5 MG TABS tablet Take 1 tablet (12.5 mg total) by mouth daily. 30 tablet 1   NOVOLOG FLEXPEN 100 UNIT/ML FlexPen INJECT SUBCUTANEOUSLY AS FOLLOWS WITH MEALS: 90-150=10u: 151-200=11u: 201-250=12u: 251-300=13u: 301-350=14u: 351-400=15u: BS>400=16u & CALL MD. (Patient taking differently: 10-16 Units 3 (three) times  daily with meals. INJECT SUBCUTANEOUSLY AS FOLLOWS WITH MEALS: 90-150=10u: 151-200=11u: 201-250=12u: 251-300=13u: 301-350=14u: 351-400=15u: BS>400=16u & CALL MD.) 15 mL 0   oxybutynin (DITROPAN-XL) 5 MG 24 hr tablet Take 5 mg by mouth daily.     pantoprazole (PROTONIX) 40 MG tablet Take 1 tablet (40 mg total) by mouth 2 (two) times daily before a meal. 180 tablet 1   polyethylene glycol (MIRALAX / GLYCOLAX) 17 g packet Take 17 g by mouth daily. (Patient taking differently: Take 17 g by mouth daily as needed for mild constipation.) 14 each 0   risperiDONE (RISPERDAL) 1 MG tablet Take 1-2 mg by mouth 2 (two) times daily. Take 1 tablet by mouth in the morning. Take 2 tablets by mouth at bedtime.     senna (SENOKOT) 8.6 MG tablet Take 1 tablet by mouth daily.     TRADJENTA 5 MG TABS tablet TAKE (1) TABLET BY MOUTH ONCE DAILY. (Patient taking differently: Take 5 mg by mouth daily.) 30 tablet 3   clonazePAM (KLONOPIN) 0.5 MG tablet Take 1 tablet (0.5 mg total) by mouth 3 (three) times daily as needed for anxiety. 10 tablet 0   gabapentin (NEURONTIN) 300 MG capsule Take 1 capsule (300 mg total) by mouth every 8 (eight) hours as needed (pain). (Patient not  taking: Reported on 06/09/2021)     HYDROcodone-acetaminophen (NORCO) 10-325 MG tablet Take 1 tablet by mouth every 8 (eight) hours as needed for severe pain. (Patient not taking: Reported on 06/09/2021) 10 tablet 0   guaiFENesin-dextromethorphan (ROBITUSSIN DM) 100-10 MG/5ML syrup Take 5 mLs by mouth every 4 (four) hours as needed for cough. (Patient not taking: Reported on 06/09/2021) 118 mL 0   ondansetron (ZOFRAN) 4 MG tablet Take 1 tablet (4 mg total) by mouth every 12 (twelve) hours as needed for nausea or vomiting. (Patient not taking: Reported on 06/09/2021)     No facility-administered medications prior to visit.     Allergies:   Sulfa antibiotics   Social History   Socioeconomic History   Marital status: Widowed    Spouse name: Not on file   Number of children: Not on file   Years of education: Not on file   Highest education level: Not on file  Occupational History   Not on file  Tobacco Use   Smoking status: Former    Packs/day: 0.25    Years: 20.00    Pack years: 5.00    Types: Cigarettes    Quit date: 01/24/1995    Years since quitting: 26.3   Smokeless tobacco: Never  Vaping Use   Vaping Use: Never used  Substance and Sexual Activity   Alcohol use: No    Alcohol/week: 0.0 standard drinks   Drug use: No   Sexual activity: Never    Birth control/protection: Post-menopausal  Other Topics Concern   Not on file  Social History Narrative   Not on file   Social Determinants of Health   Financial Resource Strain: Not on file  Food Insecurity: Not on file  Transportation Needs: Not on file  Physical Activity: Not on file  Stress: Not on file  Social Connections: Not on file     Family History:  The patient's family history includes Hypertension in her mother.   Review of Systems:    Please see the history of present illness.     All other systems reviewed and are otherwise negative except as noted above.   Physical Exam:    VS:  BP 122/80  Pulse 96    Ht '5\' 7"'$  (1.702 m)   Wt 134 lb (60.8 kg)   SpO2 97%   BMI 20.99 kg/m    General: Pleasant elderly female appearing in no acute distress. Sitting in wheelchair.  Head: Normocephalic, atraumatic. Neck: No carotid bruits. JVD not elevated.  Lungs: Respirations regular and unlabored, without wheezes or rales.  Heart: Irregularly irregular. No S3 or S4.  No murmur, no rubs, or gallops appreciated. Abdomen: Appears non-distended. No obvious abdominal masses. Msk:  Strength and tone appear normal for age. No obvious joint deformities or effusions. Extremities: No clubbing or cyanosis. Trace lower extremity edema.  Distal pedal pulses are 2+ bilaterally. Neuro: Alert and oriented X 3. Moves all extremities spontaneously. No focal deficits noted. Psych:  Responds to questions appropriately with a normal affect. Skin: No rashes or lesions noted  Wt Readings from Last 3 Encounters:  06/09/21 134 lb (60.8 kg)  06/08/21 147 lb (66.7 kg)  05/29/21 147 lb 11.3 oz (67 kg)     Studies/Labs Reviewed:   EKG:  EKG is ordered today.  The ekg ordered today demonstrates rate-controlled atrial fibrillation, HR 96 with LPFB and no acute ST changes.   Recent Labs: 04/23/2021: ALT 16; TSH 1.076 05/18/2021: B Natriuretic Peptide 348.0; Magnesium 2.1 05/19/2021: Hemoglobin 10.2; Platelets 220 05/20/2021: BUN 11; Creatinine, Ser 0.56; Potassium 3.4; Sodium 137   Lipid Panel    Component Value Date/Time   CHOL 107 12/02/2020 0858   TRIG 82 12/02/2020 0858   HDL 46 12/02/2020 0858   CHOLHDL 2.3 12/02/2020 0858   CHOLHDL 3.8 10/05/2019 0457   VLDL 30 10/05/2019 0457   LDLCALC 45 12/02/2020 0858    Additional studies/ records that were reviewed today include:   Echocardiogram: 09/2019 IMPRESSIONS     1. Left ventricular ejection fraction, by estimation, is 60 to 65%. The  left ventricle has normal function. The left ventricle has no regional  wall motion abnormalities. There is moderate concentric left  ventricular  hypertrophy and severe basal septal  hypertrophy .   2. Right ventricular systolic function is normal. The right ventricular  size is normal. Tricuspid regurgitation signal is inadequate for assessing  PA pressure.   3. Left atrial size was mildly dilated.   4. The mitral valve is normal in structure. Mild mitral valve  regurgitation. No evidence of mitral stenosis. Moderate mitral annular  calcification.   5. The aortic valve is tricuspid. Aortic valve regurgitation is not  visualized. Mild to moderate aortic valve sclerosis/calcification is  present, without any evidence of aortic stenosis.   6. The inferior vena cava is normal in size with greater than 50%  respiratory variability, suggesting right atrial pressure of 3 mmHg.   NST: 09/2019 There was no ST segment deviation noted during stress. In and out of afib during study, The study is normal. Inferior defect likely due to subdiaphragmatic attenuation, cannot completely exclude very mild inferior ischemia. Either findign would support low risk. This is a low risk study. The left ventricular ejection fraction is normal (55-65%).   Assessment:    1. PAF (paroxysmal atrial fibrillation) ([place])   2. Orthostatic hypotension   3. Mixed hyperlipidemia   4. History of DVT (deep vein thrombosis)      Plan:   In order of problems listed above:  1. Paroxysmal Atrial Fibrillation - This has been challenging to manage in the setting of orthostatic hypotension and chronic hypoxic respiratory failure. She has been on Amiodarone 200 mg  daily along with Cardizem CD 120 mg daily and Toprol-XL 50 mg daily as her BP did not previously allow for further titration of AV nodal blocking agents.  Given her well-controlled rates, will continue current medical therapy at this time. If TSH and LFT's have not been checked at the time of her next visit, would plan to obtain been given the use of Amiodarone. - No reports of active bleeding.  She remains on Eliquis '5mg'$  BID for anticoagulation at this time which is the appropriate dose given her age, weight and renal function (creatinine at 0.56 in 04/2021).   2. Orthostatic Hypotension - She did have a fall several weeks ago but this was felt to be secondary to her medications as she had been on a prescription sleep aid and this was discontinued. There has been no recurrence since and she denies any dizziness or presyncope. Continue Midodrine 10 mg 3 times daily.   3. HLD - Followed by her PCP. Remains on Atorvastatin 40 mg daily.  4. History of DVT - She is on Eliquis for anticoagulation.    Medication Adjustments/Labs and Tests Ordered: Current medicines are reviewed at length with the patient today.  Concerns regarding medicines are outlined above.  Medication changes, Labs and Tests ordered today are listed in the Patient Instructions below. Patient Instructions  Medication Instructions:  Your physician recommends that you continue on your current medications as directed. Please refer to the Current Medication list given to you today.   Labwork: None today  Testing/Procedures: None today  Follow-Up: 6 months  Any Other Special Instructions Will Be Listed Below (If Applicable).  If you need a refill on your cardiac medications before your next appointment, please call your pharmacy.    Signed, Erma Heritage, PA-C  06/09/2021 4:01 PM    [facility] [address] [place], [place] [address] Phone: [phone] Fax: [phone]

## 2021-06-09 ENCOUNTER — Encounter: Payer: Self-pay | Admitting: Student

## 2021-06-09 ENCOUNTER — Ambulatory Visit (INDEPENDENT_AMBULATORY_CARE_PROVIDER_SITE_OTHER): Payer: Medicare Other | Admitting: Student

## 2021-06-09 VITALS — BP 122/80 | HR 96 | Ht 67.0 in | Wt 134.0 lb

## 2021-06-09 DIAGNOSIS — I951 Orthostatic hypotension: Secondary | ICD-10-CM

## 2021-06-09 DIAGNOSIS — E782 Mixed hyperlipidemia: Secondary | ICD-10-CM | POA: Diagnosis not present

## 2021-06-09 DIAGNOSIS — I480 Paroxysmal atrial fibrillation: Secondary | ICD-10-CM | POA: Diagnosis not present

## 2021-06-09 DIAGNOSIS — Z86718 Personal history of other venous thrombosis and embolism: Secondary | ICD-10-CM | POA: Diagnosis not present

## 2021-06-09 NOTE — Patient Instructions (Signed)
Medication Instructions:  Your physician recommends that you continue on your current medications as directed. Please refer to the Current Medication list given to you today.   Labwork: None today  Testing/Procedures: None today  Follow-Up: 6 months  Any Other Special Instructions Will Be Listed Below (If Applicable).  If you need a refill on your cardiac medications before your next appointment, please call your pharmacy.  

## 2021-07-07 ENCOUNTER — Other Ambulatory Visit (HOSPITAL_COMMUNITY): Payer: Self-pay | Admitting: Gerontology

## 2021-07-07 ENCOUNTER — Ambulatory Visit (HOSPITAL_COMMUNITY)
Admission: RE | Admit: 2021-07-07 | Discharge: 2021-07-07 | Disposition: A | Discharge status: Home / Self Care | Payer: Medicare Other | Source: Ambulatory Visit | Attending: Gerontology | Admitting: Gerontology

## 2021-07-07 DIAGNOSIS — J189 Pneumonia, unspecified organism: Secondary | ICD-10-CM

## 2021-07-07 DIAGNOSIS — Z09 Encounter for follow-up examination after completed treatment for conditions other than malignant neoplasm: Secondary | ICD-10-CM

## 2021-07-15 ENCOUNTER — Other Ambulatory Visit: Payer: Self-pay | Admitting: Nurse Practitioner

## 2021-07-18 ENCOUNTER — Emergency Department (HOSPITAL_COMMUNITY): Payer: Medicare Other

## 2021-07-18 ENCOUNTER — Other Ambulatory Visit: Payer: Self-pay

## 2021-07-18 ENCOUNTER — Emergency Department (HOSPITAL_COMMUNITY)
Admission: EM | Admit: 2021-07-18 | Discharge: 2021-07-18 | Disposition: A | Discharge status: Skilled Nursing Facility | Payer: Medicare Other | Source: Home / Self Care | Attending: Emergency Medicine | Admitting: Emergency Medicine

## 2021-07-18 ENCOUNTER — Encounter (HOSPITAL_COMMUNITY): Payer: Self-pay | Admitting: Emergency Medicine

## 2021-07-18 DIAGNOSIS — Z794 Long term (current) use of insulin: Secondary | ICD-10-CM | POA: Insufficient documentation

## 2021-07-18 DIAGNOSIS — E119 Type 2 diabetes mellitus without complications: Secondary | ICD-10-CM | POA: Insufficient documentation

## 2021-07-18 DIAGNOSIS — F1120 Opioid dependence, uncomplicated: Secondary | ICD-10-CM | POA: Diagnosis not present

## 2021-07-18 DIAGNOSIS — Z79899 Other long term (current) drug therapy: Secondary | ICD-10-CM | POA: Insufficient documentation

## 2021-07-18 DIAGNOSIS — Z7901 Long term (current) use of anticoagulants: Secondary | ICD-10-CM | POA: Insufficient documentation

## 2021-07-18 DIAGNOSIS — R079 Chest pain, unspecified: Secondary | ICD-10-CM | POA: Insufficient documentation

## 2021-07-18 DIAGNOSIS — R112 Nausea with vomiting, unspecified: Secondary | ICD-10-CM | POA: Diagnosis not present

## 2021-07-18 DIAGNOSIS — I4891 Unspecified atrial fibrillation: Secondary | ICD-10-CM | POA: Insufficient documentation

## 2021-07-18 DIAGNOSIS — R531 Weakness: Secondary | ICD-10-CM

## 2021-07-18 LAB — COMPREHENSIVE METABOLIC PANEL
ALT: 11 U/L (ref 0–44)
AST: 12 U/L — ABNORMAL LOW (ref 15–41)
Albumin: 3.8 g/dL (ref 3.5–5.0)
Alkaline Phosphatase: 76 U/L (ref 38–126)
Anion gap: 6 (ref 5–15)
BUN: 15 mg/dL (ref 8–23)
CO2: 32 mmol/L (ref 22–32)
Calcium: 8.9 mg/dL (ref 8.9–10.3)
Chloride: 97 mmol/L — ABNORMAL LOW (ref 98–111)
Creatinine, Ser: 0.79 mg/dL (ref 0.44–1.00)
GFR, Estimated: >60 mL/min (ref >60)
Glucose, Bld: 153 mg/dL — ABNORMAL HIGH (ref 70–99)
Potassium: 3.9 mmol/L (ref 3.5–5.1)
Sodium: 135 mmol/L (ref 135–145)
Total Bilirubin: 0.5 mg/dL (ref 0.3–1.2)
Total Protein: 7.7 g/dL (ref 6.5–8.1)

## 2021-07-18 LAB — URINALYSIS, ROUTINE W REFLEX MICROSCOPIC
Bilirubin Urine: NEGATIVE
Glucose, UA: NEGATIVE mg/dL
Hgb urine dipstick: NEGATIVE
Ketones, ur: NEGATIVE mg/dL
Leukocytes,Ua: NEGATIVE
Nitrite: NEGATIVE
Protein, ur: NEGATIVE mg/dL
Specific Gravity, Urine: 1.006 (ref 1.005–1.030)
pH: 7 (ref 5.0–8.0)

## 2021-07-18 LAB — CBC WITH DIFFERENTIAL/PLATELET
Abs Immature Granulocytes: 0.02 10*3/uL (ref 0.00–0.07)
Basophils Absolute: 0 10*3/uL (ref 0.0–0.1)
Basophils Relative: 1 %
Eosinophils Absolute: 0.1 10*3/uL (ref 0.0–0.5)
Eosinophils Relative: 2 %
HCT: 38.6 % (ref 36.0–46.0)
Hemoglobin: 12.4 g/dL (ref 12.0–15.0)
Immature Granulocytes: 0 %
Lymphocytes Relative: 19 %
Lymphs Abs: 1.3 10*3/uL (ref 0.7–4.0)
MCH: 28.2 pg (ref 26.0–34.0)
MCHC: 32.1 g/dL (ref 30.0–36.0)
MCV: 87.9 fL (ref 80.0–100.0)
Monocytes Absolute: 0.5 10*3/uL (ref 0.1–1.0)
Monocytes Relative: 7 %
Neutro Abs: 4.9 10*3/uL (ref 1.7–7.7)
Neutrophils Relative %: 71 %
Platelets: 280 10*3/uL (ref 150–400)
RBC: 4.39 MIL/uL (ref 3.87–5.11)
RDW: 14.7 % (ref 11.5–15.5)
WBC: 7 10*3/uL (ref 4.0–10.5)
nRBC: 0 % (ref 0.0–0.2)

## 2021-07-18 LAB — TROPONIN I (HIGH SENSITIVITY)
Troponin I (High Sensitivity): 4 ng/L (ref <18)
Troponin I (High Sensitivity): 4 ng/L (ref <18)

## 2021-07-18 LAB — LIPASE, BLOOD: Lipase: 22 U/L (ref 11–51)

## 2021-07-18 MED ORDER — SODIUM CHLORIDE 0.9 % IV SOLN: INTRAVENOUS | Status: DC

## 2021-07-18 MED ORDER — SODIUM CHLORIDE 0.9 % IV BOLUS
1000.0000 mL | Freq: Once | INTRAVENOUS | Status: AC
  Administered 2021-07-18: 1000 mL via INTRAVENOUS

## 2021-07-18 MED ORDER — ONDANSETRON HCL 4 MG/2ML IJ SOLN
4.0000 mg | Freq: Once | INTRAMUSCULAR | Status: AC
  Administered 2021-07-18: 4 mg via INTRAVENOUS
  Filled 2021-07-18: qty 2

## 2021-07-18 NOTE — ED Notes (Signed)
Attempted to call report to [facility]. Staff answered phone and asked if patient was ready for pick up this nurse stated yes and staff hung up phone. Will attempt again for report.

## 2021-07-21 ENCOUNTER — Encounter (HOSPITAL_COMMUNITY): Payer: Self-pay | Admitting: Emergency Medicine

## 2021-07-21 ENCOUNTER — Inpatient Hospital Stay (HOSPITAL_COMMUNITY)
Admission: EM | Admit: 2021-07-21 | Discharge: 2021-07-27 | DRG: 897 | Disposition: A | Discharge status: Home Health | Payer: Medicare Other | Attending: Family Medicine | Admitting: Family Medicine

## 2021-07-21 ENCOUNTER — Emergency Department (HOSPITAL_COMMUNITY): Payer: Medicare Other

## 2021-07-21 ENCOUNTER — Other Ambulatory Visit: Payer: Self-pay | Admitting: "Endocrinology

## 2021-07-21 DIAGNOSIS — Z79899 Other long term (current) drug therapy: Secondary | ICD-10-CM

## 2021-07-21 DIAGNOSIS — I951 Orthostatic hypotension: Secondary | ICD-10-CM | POA: Diagnosis present

## 2021-07-21 DIAGNOSIS — F32A Depression, unspecified: Secondary | ICD-10-CM | POA: Diagnosis present

## 2021-07-21 DIAGNOSIS — K222 Esophageal obstruction: Secondary | ICD-10-CM | POA: Diagnosis present

## 2021-07-21 DIAGNOSIS — I482 Chronic atrial fibrillation, unspecified: Secondary | ICD-10-CM | POA: Diagnosis present

## 2021-07-21 DIAGNOSIS — Z86718 Personal history of other venous thrombosis and embolism: Secondary | ICD-10-CM | POA: Diagnosis not present

## 2021-07-21 DIAGNOSIS — E782 Mixed hyperlipidemia: Secondary | ICD-10-CM | POA: Diagnosis present

## 2021-07-21 DIAGNOSIS — F209 Schizophrenia, unspecified: Secondary | ICD-10-CM | POA: Diagnosis present

## 2021-07-21 DIAGNOSIS — K2100 Gastro-esophageal reflux disease with esophagitis, without bleeding: Secondary | ICD-10-CM | POA: Diagnosis present

## 2021-07-21 DIAGNOSIS — T18128A Food in esophagus causing other injury, initial encounter: Secondary | ICD-10-CM | POA: Diagnosis not present

## 2021-07-21 DIAGNOSIS — E119 Type 2 diabetes mellitus without complications: Secondary | ICD-10-CM

## 2021-07-21 DIAGNOSIS — F419 Anxiety disorder, unspecified: Secondary | ICD-10-CM | POA: Diagnosis present

## 2021-07-21 DIAGNOSIS — K449 Diaphragmatic hernia without obstruction or gangrene: Secondary | ICD-10-CM | POA: Diagnosis present

## 2021-07-21 DIAGNOSIS — Z8616 Personal history of COVID-19: Secondary | ICD-10-CM

## 2021-07-21 DIAGNOSIS — I480 Paroxysmal atrial fibrillation: Secondary | ICD-10-CM | POA: Diagnosis not present

## 2021-07-21 DIAGNOSIS — M1990 Unspecified osteoarthritis, unspecified site: Secondary | ICD-10-CM | POA: Diagnosis present

## 2021-07-21 DIAGNOSIS — E1165 Type 2 diabetes mellitus with hyperglycemia: Secondary | ICD-10-CM

## 2021-07-21 DIAGNOSIS — R109 Unspecified abdominal pain: Secondary | ICD-10-CM

## 2021-07-21 DIAGNOSIS — Z87891 Personal history of nicotine dependence: Secondary | ICD-10-CM

## 2021-07-21 DIAGNOSIS — Z8249 Family history of ischemic heart disease and other diseases of the circulatory system: Secondary | ICD-10-CM

## 2021-07-21 DIAGNOSIS — Z794 Long term (current) use of insulin: Secondary | ICD-10-CM

## 2021-07-21 DIAGNOSIS — I10 Essential (primary) hypertension: Secondary | ICD-10-CM

## 2021-07-21 DIAGNOSIS — Z882 Allergy status to sulfonamides status: Secondary | ICD-10-CM | POA: Diagnosis not present

## 2021-07-21 DIAGNOSIS — F203 Undifferentiated schizophrenia: Secondary | ICD-10-CM | POA: Diagnosis not present

## 2021-07-21 DIAGNOSIS — Z7984 Long term (current) use of oral hypoglycemic drugs: Secondary | ICD-10-CM

## 2021-07-21 DIAGNOSIS — R9389 Abnormal findings on diagnostic imaging of other specified body structures: Secondary | ICD-10-CM

## 2021-07-21 DIAGNOSIS — I82409 Acute embolism and thrombosis of unspecified deep veins of unspecified lower extremity: Secondary | ICD-10-CM | POA: Diagnosis present

## 2021-07-21 DIAGNOSIS — R1010 Upper abdominal pain, unspecified: Secondary | ICD-10-CM | POA: Diagnosis not present

## 2021-07-21 DIAGNOSIS — K5900 Constipation, unspecified: Secondary | ICD-10-CM | POA: Diagnosis not present

## 2021-07-21 DIAGNOSIS — I69354 Hemiplegia and hemiparesis following cerebral infarction affecting left non-dominant side: Secondary | ICD-10-CM | POA: Diagnosis not present

## 2021-07-21 DIAGNOSIS — K224 Dyskinesia of esophagus: Secondary | ICD-10-CM | POA: Diagnosis present

## 2021-07-21 DIAGNOSIS — E1169 Type 2 diabetes mellitus with other specified complication: Secondary | ICD-10-CM | POA: Diagnosis not present

## 2021-07-21 DIAGNOSIS — F1120 Opioid dependence, uncomplicated: Secondary | ICD-10-CM | POA: Diagnosis present

## 2021-07-21 DIAGNOSIS — K2950 Unspecified chronic gastritis without bleeding: Secondary | ICD-10-CM | POA: Diagnosis present

## 2021-07-21 DIAGNOSIS — I119 Hypertensive heart disease without heart failure: Secondary | ICD-10-CM | POA: Diagnosis present

## 2021-07-21 DIAGNOSIS — R9431 Abnormal electrocardiogram [ECG] [EKG]: Secondary | ICD-10-CM

## 2021-07-21 DIAGNOSIS — Z7901 Long term (current) use of anticoagulants: Secondary | ICD-10-CM

## 2021-07-21 DIAGNOSIS — I4892 Unspecified atrial flutter: Secondary | ICD-10-CM | POA: Diagnosis not present

## 2021-07-21 DIAGNOSIS — R112 Nausea with vomiting, unspecified: Secondary | ICD-10-CM | POA: Diagnosis present

## 2021-07-21 DIAGNOSIS — Z993 Dependence on wheelchair: Secondary | ICD-10-CM

## 2021-07-21 LAB — CBC
HCT: 39.3 % (ref 36.0–46.0)
Hemoglobin: 12.7 g/dL (ref 12.0–15.0)
MCH: 28.2 pg (ref 26.0–34.0)
MCHC: 32.3 g/dL (ref 30.0–36.0)
MCV: 87.1 fL (ref 80.0–100.0)
Platelets: 278 10*3/uL (ref 150–400)
RBC: 4.51 MIL/uL (ref 3.87–5.11)
RDW: 14.7 % (ref 11.5–15.5)
WBC: 8 10*3/uL (ref 4.0–10.5)
nRBC: 0 % (ref 0.0–0.2)

## 2021-07-21 LAB — COMPREHENSIVE METABOLIC PANEL
ALT: 13 U/L (ref 0–44)
AST: 17 U/L (ref 15–41)
Albumin: 4.1 g/dL (ref 3.5–5.0)
Alkaline Phosphatase: 75 U/L (ref 38–126)
Anion gap: 8 (ref 5–15)
BUN: 15 mg/dL (ref 8–23)
CO2: 30 mmol/L (ref 22–32)
Calcium: 9 mg/dL (ref 8.9–10.3)
Chloride: 99 mmol/L (ref 98–111)
Creatinine, Ser: 0.77 mg/dL (ref 0.44–1.00)
GFR, Estimated: >60 mL/min (ref >60)
Glucose, Bld: 121 mg/dL — ABNORMAL HIGH (ref 70–99)
Potassium: 4.1 mmol/L (ref 3.5–5.1)
Sodium: 137 mmol/L (ref 135–145)
Total Bilirubin: 0.5 mg/dL (ref 0.3–1.2)
Total Protein: 8 g/dL (ref 6.5–8.1)

## 2021-07-21 LAB — LIPASE, BLOOD: Lipase: 22 U/L (ref 11–51)

## 2021-07-21 LAB — URINALYSIS, ROUTINE W REFLEX MICROSCOPIC
Bilirubin Urine: NEGATIVE
Glucose, UA: NEGATIVE mg/dL
Hgb urine dipstick: NEGATIVE
Ketones, ur: NEGATIVE mg/dL
Leukocytes,Ua: NEGATIVE
Nitrite: NEGATIVE
Protein, ur: NEGATIVE mg/dL
Specific Gravity, Urine: 1.019 (ref 1.005–1.030)
pH: 7 (ref 5.0–8.0)

## 2021-07-21 LAB — TROPONIN I (HIGH SENSITIVITY)
Troponin I (High Sensitivity): 3 ng/L (ref <18)
Troponin I (High Sensitivity): 4 ng/L (ref <18)

## 2021-07-21 MED ORDER — ONDANSETRON HCL 4 MG/2ML IJ SOLN
4.0000 mg | Freq: Once | INTRAMUSCULAR | Status: AC
  Administered 2021-07-21: 4 mg via INTRAVENOUS
  Filled 2021-07-21: qty 2

## 2021-07-21 MED ORDER — FAMOTIDINE IN NACL 20-0.9 MG/50ML-% IV SOLN
20.0000 mg | Freq: Once | INTRAVENOUS | Status: AC
  Administered 2021-07-21: 20 mg via INTRAVENOUS
  Filled 2021-07-21: qty 50

## 2021-07-21 MED ORDER — SODIUM CHLORIDE 0.9 % IV SOLN
1.0000 g | Freq: Once | INTRAVENOUS | Status: AC
  Administered 2021-07-21: 1 g via INTRAVENOUS
  Filled 2021-07-21: qty 10

## 2021-07-21 MED ORDER — PANTOPRAZOLE SODIUM 40 MG IV SOLR
40.0000 mg | Freq: Once | INTRAVENOUS | Status: AC
  Administered 2021-07-21: 40 mg via INTRAVENOUS
  Filled 2021-07-21: qty 10

## 2021-07-21 MED ORDER — IOHEXOL 300 MG/ML  SOLN
100.0000 mL | Freq: Once | INTRAMUSCULAR | Status: AC | PRN
  Administered 2021-07-21: 100 mL via INTRAVENOUS

## 2021-07-21 MED ORDER — MORPHINE SULFATE (PF) 4 MG/ML IV SOLN
4.0000 mg | Freq: Once | INTRAVENOUS | Status: AC
  Administered 2021-07-21: 4 mg via INTRAVENOUS
  Filled 2021-07-21: qty 1

## 2021-07-21 NOTE — ED Triage Notes (Signed)
Pt brought in by [facility] from [facility] Pt sent out due to c/o abd pain and emesis. Pt also states burning feeling in her chest that started after the vomiting.

## 2021-07-21 NOTE — ED Provider Notes (Signed)
[facility] EMERGENCY DEPARTMENT Provider Note  CSN: [id number] Arrival date & time: 07/21/21 1941  Chief Complaint(s) Abdominal Pain  HPI [name] is a 76 y.o. female with PMH paroxysmal atrial fibrillation, anxiety/depression, hypertension, orthostatic hypotension, prior history of stroke with left-sided residual deficits; type 2 diabetes mellitus, hyperlipidemia, history of recurrent lower extremity DVTs, chronic opiate usage and gastroesophageal reflux who presents emergency department for evaluation of intractable nausea vomiting and abdominal pain.  Patient states that she has had persistent nausea and vomiting for the last 4 days and was seen in the emergency department 3 days ago with a negative abdominal x-ray and ultimately discharged home.  Here in the emergency department, she appears uncomfortable complaining of burning chest pain radiating up from the epigastrium.  She states that she also feels constipated.  Denies headache, fever or other systemic symptoms.   Past Medical History Past Medical History:  Diagnosis Date   Anxiety    Arthritis    Atrial fibrillation (HCC)    Atrial flutter ([place])    Collagen vascular disease ([place])    COVID-19 virus infection    COVID-19+ approx 01/24/19; asymptomatic course with full recovery   Dependence on wheelchair    pivot/transfers   Depression    History of psychosis and previous suicide attempt   DVT, lower extremity, recurrent ([place])    Long-term Coumadin per Dr. Legrand Rams   Essential hypertension    GERD (gastroesophageal reflux disease)    Hemiplegia ([place]) 2010   Left side   History of stroke    Acute infarct and right cerebral white matter small vessel disease 12/10   Leg DVT (deep venous thromboembolism), acute ([place]) 2006   Orthostatic hypotension    Schizophrenia ([place])    Stroke ([place])    left sided weakness   Type 2 diabetes mellitus ([place])    Patient Active Problem List   Diagnosis Date Noted   Mood disorder ([place])  05/18/2021   Multifocal pneumonia    Fecal impaction ([place]) 04/23/2021   Edema of right forearm    Acute respiratory failure with hypoxia ([place]) 03/22/2021   Lobar pneumonia ([place]) 03/22/2021   Sepsis due to undetermined organism ([place]) 03/22/2021   Paroxysmal atrial fibrillation ([place]) 03/22/2021   Nausea without vomiting 11/17/2020   Hyperthyroidism    Hypokalemia    SBO (small bowel obstruction) ([place]) 04/18/2020   Chest pain 02/21/2020   Hemorrhoids 11/26/2019   Atrial fibrillation with RVR ([place]) 10/04/2019   GERD (gastroesophageal reflux disease) 08/26/2019   Loss of weight 08/26/2019   Constipation 09/09/2018   Hypotension 03/21/2018   Diarrhea 07/20/2017   Primary osteoarthritis of right hip 04/18/2017   Orthostasis 03/28/2017   General weakness 03/28/2017   History of cerebrovascular accident (CVA) with residual deficit 03/28/2017   Schizophrenia ([place]) 03/28/2017   Generalized weakness 03/28/2017   Orthostatic hypotension    IBS (irritable bowel syndrome) 10/16/2016   Left hemiparesis ([place]) 10/06/2016   Left-sided weakness 10/06/2016   Chronic superficial gastritis without bleeding    Stricture and stenosis of esophagus    Nausea with vomiting 04/20/2016   Abdominal pain, acute, bilateral lower quadrant 03/22/2016   Lesion of right lobe of liver 03/22/2016   Elevated troponin 02/13/2016   IDA (iron deficiency anemia) 04/06/2015   Mixed hyperlipidemia 11/06/2014   Obesity due to excess calories 11/06/2014   Sedentary lifestyle 11/06/2014   Rectal bleeding 10/21/2014   Uncontrolled type 2 diabetes mellitus with hyperglycemia, with long-term current use of insulin ([place]) 03/24/2010  Essential hypertension, benign 03/24/2010   DVT, lower extremity, recurrent ([place]) 03/24/2010   History of cardiovascular disorder 03/24/2010   Home Medication(s) Prior to Admission medications   Medication Sig Start Date End Date Taking? Authorizing Provider  ABILIFY 2 MG tablet Take 1  tablet (2 mg total) by mouth daily. Patient taking differently: Take 2 mg by mouth at bedtime. 11/09/20 07/21/21 Yes [name], MD  acetaminophen (TYLENOL) 325 MG tablet Take 650 mg by mouth 3 (three) times daily as needed for mild pain or moderate pain.    Yes [provider]  albuterol (VENTOLIN HFA) 108 (90 Base) MCG/ACT inhaler Inhale 2 puffs into the lungs every 6 (six) hours as needed for wheezing or shortness of breath. 05/20/21  Yes Barton Dubois, MD  amiodarone (PACERONE) 200 MG tablet Take 1 tablet (200 mg total) by mouth daily. 04/24/21  Yes Barton Dubois, MD  apixaban (ELIQUIS) 5 MG TABS tablet Take 1 tablet (5 mg total) by mouth 2 (two) times daily. 02/21/20  Yes Richarda Osmond, MD  atorvastatin (LIPITOR) 40 MG tablet Take 1 tablet by mouth at bedtime. 05/30/20  Yes [provider]  busPIRone (BUSPAR) 7.5 MG tablet Take 7.5 mg by mouth 2 (two) times daily. 07/07/21  Yes [provider]  calcium carbonate (TUMS - DOSED IN MG ELEMENTAL CALCIUM) 500 MG chewable tablet Chew 1 tablet by mouth 4 (four) times daily as needed for heartburn.   Yes [provider]  Carboxymethylcellulose Sodium (ARTIFICIAL TEARS OP) Place 1 drop into both eyes 3 (three) times daily.   Yes [provider]  Cholecalciferol (VITAMIN D3) 50 MCG (2000 UT) TABS Take 2,000 Units by mouth daily.   Yes [provider]  clonazePAM (KLONOPIN) 0.5 MG tablet Take 1 tablet (0.5 mg total) by mouth 3 (three) times daily as needed for anxiety. 05/20/21  Yes Barton Dubois, MD  diltiazem (CARDIZEM CD) 120 MG 24 hr capsule Take 120 mg by mouth daily. 05/30/20  Yes [provider]  DULoxetine (CYMBALTA) 60 MG capsule Take 120 mg by mouth daily.   Yes [provider]  gabapentin (NEURONTIN) 300 MG capsule Take 1 capsule (300 mg total) by mouth every 8 (eight) hours as needed (pain). 05/20/21  Yes Barton Dubois, MD  HYDROcodone-acetaminophen [facility]) 10-325 MG tablet  Take 1 tablet by mouth every 8 (eight) hours as needed for severe pain. 05/20/21  Yes Barton Dubois, MD  insulin detemir (LEVEMIR FLEXTOUCH) 100 UNIT/ML FlexPen INJECT 22 UNITS SUBCUTANEOUSLY AT BEDTIME.(HOLD IF BS<70: CALL MD IF BS> 400) 07/15/21  Yes Reardon, Juanetta Beets, NP  latanoprost (XALATAN) 0.005 % ophthalmic solution Place 1 drop into both eyes at bedtime.   Yes [provider]  loratadine (CLARITIN) 10 MG tablet Take 10 mg by mouth daily.   Yes [provider]  metoCLOPramide (REGLAN) 5 MG tablet Take 5 mg by mouth 3 (three) times daily. If Zofran is not working 06/27/21  Yes [provider]  metoprolol succinate (TOPROL-XL) 50 MG 24 hr tablet Take 1 tablet (50 mg total) by mouth daily. Take with or immediately following a meal. 04/01/21  Yes Tat, Shanon Brow, MD  midodrine (PROAMATINE) 10 MG tablet Take 10 mg by mouth 3 (three) times daily.   Yes [provider]  mirtazapine (REMERON) 30 MG tablet Take 30 mg by mouth at bedtime.   Yes [provider]  naloxegol oxalate (MOVANTIK) 12.5 MG TABS tablet Take 1 tablet (12.5 mg total) by mouth daily. 04/24/21  Yes  Barton Dubois, MD  NOVOLOG FLEXPEN 100 UNIT/ML FlexPen INJECT SUBCUTANEOUSLY AS FOLLOWS WITH MEALS: 90-150=10u: 151-200=11u: 201-250=12u: 251-300=13u: 301-350=14u: 351-400=15u: BS>400=16u & CALL MD. Patient taking differently: 10-16 Units 3 (three) times daily with meals. INJECT SUBCUTANEOUSLY AS FOLLOWS WITH MEALS: 90-150=10u: 151-200=11u: 201-250=12u: 251-300=13u: 301-350=14u: 351-400=15u: BS>400=16u & CALL MD. 02/28/21  Yes Brita Romp, NP  ondansetron (ZOFRAN) 4 MG tablet Take by mouth 2 (two) times daily. 06/24/21  Yes [provider]  oxybutynin (DITROPAN-XL) 5 MG 24 hr tablet Take 5 mg by mouth daily. 04/20/21  Yes [provider]  pantoprazole (PROTONIX) 40 MG tablet Take 1 tablet (40 mg total) by mouth 2 (two) times daily before a meal. 11/17/20  Yes Mahala Menghini, PA-C   polyethylene glycol (MIRALAX / GLYCOLAX) 17 g packet Take 17 g by mouth daily. Patient taking differently: Take 17 g by mouth daily as needed for mild constipation. 04/24/21  Yes Barton Dubois, MD  risperiDONE (RISPERDAL) 1 MG tablet Take 1-2 mg by mouth 2 (two) times daily. Take 1 tablet by mouth in the morning. Take 2 tablets by mouth at bedtime. 04/04/21  Yes [provider]  SANTYL 250 UNIT/GM ointment Apply 1 Application topically daily. 07/05/21  Yes [provider]  senna (SENOKOT) 8.6 MG tablet Take 1 tablet by mouth daily.   Yes [provider]  TRADJENTA 5 MG TABS tablet TAKE (1) TABLET BY MOUTH ONCE DAILY. Patient taking differently: Take 5 mg by mouth daily. 04/04/21  Yes Reardon, Juanetta Beets, NP  bisacodyl (DULCOLAX) 5 MG EC tablet Take 1 tablet (5 mg total) by mouth daily as needed for moderate constipation or mild constipation. Patient not taking: Reported on 07/21/2021 04/24/21 04/24/22  Barton Dubois, MD  [facility] TEST test strip USE TO CHECK BLOOD SUGAR FOUR TIMES DAILY.(HOLD IF BS<70: CALL MD IF BS> 400) 07/21/21   Brita Romp, NP  GOODSENSE ARTIFICIAL TEARS 0.5-0.6 % SOLN Apply to eye. 06/03/21   [provider]                                                                                                                                    Past Surgical History Past Surgical History:  Procedure Laterality Date   BACK SURGERY     BIOPSY N/A 11/24/2014   Procedure: BIOPSY;  Surgeon: Danie Binder, MD;  Location: [facility] ORS;  Service: Endoscopy;  Laterality: N/A;   BIOPSY  05/17/2016   Procedure: BIOPSY;  Surgeon: Danie Binder, MD;  Location: [facility] ENDO SUITE;  Service: Endoscopy;;  gastric biopsy   COLONOSCOPY WITH PROPOFOL N/A 11/24/2014   Dr. Rudie Meyer polyps removed/moderate sized internal hemorrhoids, tubular adenomas. Next surveillance in 3 years   ESOPHAGOGASTRODUODENOSCOPY (EGD) WITH PROPOFOL N/A 11/24/2014   Dr. Clayburn Pert HH/patent stricture  at the gastroesophageal junction, mild non-erosive gastritis, path negative for H.pylori or celiac sprue   ESOPHAGOGASTRODUODENOSCOPY (EGD) WITH PROPOFOL N/A 05/17/2016  Procedure: ESOPHAGOGASTRODUODENOSCOPY (EGD) WITH PROPOFOL;  Surgeon: Danie Binder, MD;  Location: [facility] END[facility];  Service: Endoscopy;  Laterality: N/A;  12:45pm   GIVENS CAPSULE STUDY N/A 12/11/2014   MULTILPLE EROSION IN the stomach WITH ACTIVE OOZING. OCCASIONAL EROSIONS AND RARE ULCER SEEN IN PROXIMAL SMALL BOWEL . No masses or AVMs SEEN. NO OLD BLOOD OR FRESH BLOOD SEEN.    POLYPECTOMY N/A 11/24/2014   Procedure: POLYPECTOMY;  Surgeon: Danie Binder, MD;  Location: [facility] ORS;  Service: Endoscopy;  Laterality: N/A;   SAVORY DILATION N/A 05/17/2016   Procedure: SAVORY DILATION;  Surgeon: Danie Binder, MD;  Location: [facility] END[facility];  Service: Endoscopy;  Laterality: N/A;   Family History Family History  Problem Relation Age of Onset   Hypertension Mother    Colon cancer Neg Hx     Social History Social History   Tobacco Use   Smoking status: Former    Packs/day: 0.25    Years: 20.00    Total pack years: 5.00    Types: Cigarettes    Quit date: 01/24/1995    Years since quitting: 26.5   Smokeless tobacco: Never  Vaping Use   Vaping Use: Never used  Substance Use Topics   Alcohol use: No    Alcohol/week: 0.0 standard drinks of alcohol   Drug use: No   Allergies Sulfa antibiotics  Review of Systems Review of Systems  Cardiovascular:  Positive for chest pain.  Gastrointestinal:  Positive for abdominal pain, nausea and vomiting.    Physical Exam Vital Signs  I have reviewed the triage vital signs BP 121/71   Pulse 85   Temp 98.3 F (36.8 C) (Oral)   Resp (!) 22   Ht '5\' 7"'$  (1.702 m)   Wt 60.8 kg   SpO2 97%   BMI 20.99 kg/m   Physical Exam Vitals and nursing note reviewed.  Constitutional:      General: She is not in acute distress.    Appearance: She is well-developed.  HENT:     Head:  Normocephalic and atraumatic.  Eyes:     Conjunctiva/sclera: Conjunctivae normal.  Cardiovascular:     Rate and Rhythm: Normal rate and regular rhythm.     Heart sounds: No murmur heard. Pulmonary:     Effort: Pulmonary effort is normal. No respiratory distress.     Breath sounds: Normal breath sounds.  Abdominal:     General: There is distension.     Palpations: Abdomen is soft.     Tenderness: There is abdominal tenderness in the epigastric area.  Musculoskeletal:        General: No swelling.     Cervical back: Neck supple.  Skin:    General: Skin is warm and dry.     Capillary Refill: Capillary refill takes less than 2 seconds.  Neurological:     Mental Status: She is alert.  Psychiatric:        Mood and Affect: Mood normal.     ED Results and Treatments Labs (all labs ordered are listed, but only abnormal results are displayed) Labs Reviewed  COMPREHENSIVE METABOLIC PANEL - Abnormal; Notable for the following components:      Result Value   Glucose, Bld 121 (*)    All other components within normal limits  URINALYSIS, ROUTINE W REFLEX MICROSCOPIC - Abnormal; Notable for the following components:   APPearance HAZY (*)    All other components within normal limits  LIPASE, BLOOD  CBC  TROPONIN I (HIGH  SENSITIVITY)  TROPONIN I (HIGH SENSITIVITY)                                                                                                                          Radiology CT ABDOMEN PELVIS W CONTRAST  Result Date: 07/21/2021 CLINICAL DATA:  Epigastric pain. EXAM: CT ABDOMEN AND PELVIS WITH CONTRAST TECHNIQUE: Multidetector CT imaging of the abdomen and pelvis was performed using the standard protocol following bolus administration of intravenous contrast. RADIATION DOSE REDUCTION: This exam was performed according to the departmental dose-optimization program which includes automated exposure control, adjustment of the mA and/or kV according to patient size and/or use  of iterative reconstruction technique. CONTRAST:  148m OMNIPAQUE IOHEXOL 300 MG/ML  SOLN COMPARISON:  CT abdomen and pelvis 04/23/2021 FINDINGS: Lower chest: There is a calcified granuloma in the right lower lobe, unchanged. No acute findings. Hepatobiliary: Hepatic hemangioma measuring up to 3.3 cm appears unchanged. No new liver lesions. Gallstones are again noted. There is no bile duct dilatation. Pancreas: Unremarkable. No pancreatic ductal dilatation or surrounding inflammatory changes. Spleen: Normal in size without focal abnormality. Adrenals/Urinary Tract: Adrenal glands are unremarkable. Kidneys are normal, without renal calculi, focal lesion, or hydronephrosis. Bladder is unremarkable. Stomach/Bowel: Appendix appears normal. No evidence of bowel wall thickening, distention, or inflammatory changes. There is a large amount of stool in the rectosigmoid colon. There is a small hiatal hernia. The remaining stomach is within normal limits. There is distention and debris in the distal esophagus. Vascular/Lymphatic: Aortic atherosclerosis. No enlarged abdominal or pelvic lymph nodes. Reproductive: Uterus and bilateral adnexa are unremarkable. Other: No abdominal wall hernia or abnormality. No abdominopelvic ascites. Musculoskeletal: Degenerative changes affect the spine. There is stable grade 1 anterolisthesis at L4-L5. IMPRESSION: 1. There is debris in the distal esophagus with moderate distention. This can be seen with gastroesophageal reflux. Distal esophageal obstructing process not excluded. Please correlate clinically. 2. Small hiatal hernia. 3. Large amount of stool in the rectosigmoid colon. 4. Cholelithiasis. 5.  Aortic Atherosclerosis (ICD10-I70.0). Electronically Signed   By: ARonney AstersM.D.   On: 07/21/2021 22:39   DG Chest 2 View  Result Date: 07/21/2021 CLINICAL DATA:  Chest pain EXAM: CHEST - 2 VIEW COMPARISON:  07/18/2021, 07/07/2021 FINDINGS: Cardiomegaly. Increased opacity at the left  lung base. Aortic atherosclerosis. No pneumothorax IMPRESSION: 1. Cardiomegaly with interim mild airspace disease at the peripheral left base, atelectasis versus pneumonia Electronically Signed   By: KDonavan FoilM.D.   On: 07/21/2021 21:18    Pertinent labs & imaging results that were available during my care of the patient were reviewed by me and considered in my medical decision making (see MDM for details).  Medications Ordered in ED Medications  famotidine (PEPCID) IVPB 20 mg premix (20 mg Intravenous New Bag/Given 07/21/21 2312)  iohexol (OMNIPAQUE) 300 MG/ML solution 100 mL (100 mLs Intravenous Contrast Given 07/21/21 2221)  ondansetron (ZOFRAN) injection 4 mg (4 mg Intravenous Given 07/21/21 2308)  morphine (PF) 4 MG/ML  injection 4 mg (4 mg Intravenous Given 07/21/21 2310)  pantoprazole (PROTONIX) injection 40 mg (40 mg Intravenous Given 07/21/21 2309)                                                                                                                                     Procedures Procedures  (including critical care time)  Medical Decision Making / ED Course   This patient presents to the ED for concern of chest pain, abdominal pain, nausea, vomiting, this involves an extensive number of treatment options, and is a complaint that carries with it a high risk of complications and morbidity.  The differential diagnosis includes reflux, esophageal obstruction, intra-abdominal infection, ACS, PE  MDM: Patient seen in the emergency department for evaluation of chest pain, nausea, vomiting, abdominal pain.  Physical exam reveals abdominal distention with generalized abdominal tenderness worse in the epigastrium.  Laboratory evaluation unremarkable.  Chest x-ray with cardiomegaly and new infiltrate likely secondary to aspiration from her recurrent vomiting.  CT abdomen pelvis with debris in the distal esophagus with esophageal dilation concerning for reflux versus esophageal  obstruction.  Patient is able to tolerate her secretions here in the emergency department today and I do not believe that she has a retained food bolus and will be likely safe for overnight observation with EGD in the morning.  I spoke with Dr. Abbey Chatters of gastroenterology who recommends keeping the patient n.p.o. and she will be seen first thing in the morning.  Ceftriaxone started for the patient's pneumonia and patient admitted.   Additional history obtained:  -External records from outside source obtained and reviewed including: Chart review including previous notes, labs, imaging, consultation notes   Lab Tests: -I ordered, reviewed, and interpreted labs.   The pertinent results include:   Labs Reviewed  COMPREHENSIVE METABOLIC PANEL - Abnormal; Notable for the following components:      Result Value   Glucose, Bld 121 (*)    All other components within normal limits  URINALYSIS, ROUTINE W REFLEX MICROSCOPIC - Abnormal; Notable for the following components:   APPearance HAZY (*)    All other components within normal limits  LIPASE, BLOOD  CBC  TROPONIN I (HIGH SENSITIVITY)  TROPONIN I (HIGH SENSITIVITY)      EKG   EKG Interpretation  Date/Time:  Thursday July 21 2021 19:55:48 EDT Ventricular Rate:  87 PR Interval:    QRS Duration: 99 QT Interval:  421 QTC Calculation: 507 R Axis:   -9 Text Interpretation: Atrial fibrillation Inferior infarct, old Prolonged QT interval Confirmed by [place] ([id number]) on 07/21/2021 11:33:10 PM         Imaging Studies ordered: I ordered imaging studies including chest x-ray, CT abdomen pelvis I independently visualized and interpreted imaging. I agree with the radiologist interpretation   Medicines ordered and prescription drug management: Meds ordered this encounter  Medications   iohexol (OMNIPAQUE) 300 MG/ML solution 100 mL  ondansetron (ZOFRAN) injection 4 mg   morphine (PF) 4 MG/ML injection 4 mg   pantoprazole  (PROTONIX) injection 40 mg   famotidine (PEPCID) IVPB 20 mg premix    -I have reviewed the patients home medicines and have made adjustments as needed  Critical interventions none  Consultations Obtained: I requested consultation with the gastroenterologist,  and discussed lab and imaging findings as well as pertinent plan - they recommend: Medical admission, n.p.o. scope in the morning   Cardiac Monitoring: The patient was maintained on a cardiac monitor.  I personally viewed and interpreted the cardiac monitored which showed an underlying rhythm of: A-fib  Social Determinants of Health:  Factors impacting patients care include: Lives in a group home   Reevaluation: After the interventions noted above, I reevaluated the patient and found that they have :improved  Co morbidities that complicate the patient evaluation  Past Medical History:  Diagnosis Date   Anxiety    Arthritis    Atrial fibrillation ([place])    Atrial flutter ([place])    Collagen vascular disease ([place])    COVID-19 virus infection    COVID-19+ approx 01/24/19; asymptomatic course with full recovery   Dependence on wheelchair    pivot/transfers   Depression    History of psychosis and previous suicide attempt   DVT, lower extremity, recurrent ([place])    Long-term Coumadin per Dr. Legrand Rams   Essential hypertension    GERD (gastroesophageal reflux disease)    Hemiplegia ([place]) 2010   Left side   History of stroke    Acute infarct and right cerebral white matter small vessel disease 12/10   Leg DVT (deep venous thromboembolism), acute ([place]) 2006   Orthostatic hypotension    Schizophrenia ([place])    Stroke ([place])    left sided weakness   Type 2 diabetes mellitus ([place])       Dispostion: I considered admission for this patient, and due to inability tolerate p.o. and concern for esophageal obstruction versus worsening gastritis, patient will require hospital admission for EGD in the morning     Final Clinical  Impression(s) / ED Diagnoses Final diagnoses:  None     '@PCDICTATION'$ @    Teressa Lower, MD 07/21/21 [phone]

## 2021-07-22 ENCOUNTER — Other Ambulatory Visit: Payer: Self-pay

## 2021-07-22 DIAGNOSIS — R9431 Abnormal electrocardiogram [ECG] [EKG]: Secondary | ICD-10-CM

## 2021-07-22 DIAGNOSIS — R112 Nausea with vomiting, unspecified: Secondary | ICD-10-CM

## 2021-07-22 DIAGNOSIS — R9389 Abnormal findings on diagnostic imaging of other specified body structures: Secondary | ICD-10-CM | POA: Diagnosis not present

## 2021-07-22 DIAGNOSIS — I82409 Acute embolism and thrombosis of unspecified deep veins of unspecified lower extremity: Secondary | ICD-10-CM

## 2021-07-22 DIAGNOSIS — I10 Essential (primary) hypertension: Secondary | ICD-10-CM

## 2021-07-22 DIAGNOSIS — R1010 Upper abdominal pain, unspecified: Secondary | ICD-10-CM | POA: Diagnosis not present

## 2021-07-22 DIAGNOSIS — K222 Esophageal obstruction: Secondary | ICD-10-CM

## 2021-07-22 DIAGNOSIS — E782 Mixed hyperlipidemia: Secondary | ICD-10-CM

## 2021-07-22 DIAGNOSIS — I482 Chronic atrial fibrillation, unspecified: Secondary | ICD-10-CM | POA: Diagnosis not present

## 2021-07-22 LAB — CBC
HCT: 35.8 % — ABNORMAL LOW (ref 36.0–46.0)
Hemoglobin: 11.7 g/dL — ABNORMAL LOW (ref 12.0–15.0)
MCH: 28.4 pg (ref 26.0–34.0)
MCHC: 32.7 g/dL (ref 30.0–36.0)
MCV: 86.9 fL (ref 80.0–100.0)
Platelets: 237 K/uL (ref 150–400)
RBC: 4.12 MIL/uL (ref 3.87–5.11)
RDW: 14.6 % (ref 11.5–15.5)
WBC: 7.3 K/uL (ref 4.0–10.5)
nRBC: 0 % (ref 0.0–0.2)

## 2021-07-22 LAB — COMPREHENSIVE METABOLIC PANEL WITH GFR
ALT: 11 U/L (ref 0–44)
AST: 12 U/L — ABNORMAL LOW (ref 15–41)
Albumin: 3.4 g/dL — ABNORMAL LOW (ref 3.5–5.0)
Alkaline Phosphatase: 64 U/L (ref 38–126)
Anion gap: 5 (ref 5–15)
BUN: 12 mg/dL (ref 8–23)
CO2: 31 mmol/L (ref 22–32)
Calcium: 8.9 mg/dL (ref 8.9–10.3)
Chloride: 102 mmol/L (ref 98–111)
Creatinine, Ser: 0.77 mg/dL (ref 0.44–1.00)
GFR, Estimated: >60 mL/min (ref >60)
Glucose, Bld: 162 mg/dL — ABNORMAL HIGH (ref 70–99)
Potassium: 4.2 mmol/L (ref 3.5–5.1)
Sodium: 138 mmol/L (ref 135–145)
Total Bilirubin: 0.4 mg/dL (ref 0.3–1.2)
Total Protein: 6.9 g/dL (ref 6.5–8.1)

## 2021-07-22 LAB — GLUCOSE, CAPILLARY
Glucose-Capillary: 196 mg/dL — ABNORMAL HIGH (ref 70–99)
Glucose-Capillary: 185 mg/dL — ABNORMAL HIGH (ref 70–99)
Glucose-Capillary: 139 mg/dL — ABNORMAL HIGH (ref 70–99)
Glucose-Capillary: 142 mg/dL — ABNORMAL HIGH (ref 70–99)

## 2021-07-22 LAB — PHOSPHORUS: Phosphorus: 3.5 mg/dL (ref 2.5–4.6)

## 2021-07-22 LAB — MAGNESIUM: Magnesium: 2.1 mg/dL (ref 1.7–2.4)

## 2021-07-22 MED ORDER — ATORVASTATIN CALCIUM 40 MG PO TABS
40.0000 mg | ORAL_TABLET | Freq: Every day | ORAL | Status: DC
  Administered 2021-07-22 – 2021-07-26 (×4): 40 mg via ORAL
  Filled 2021-07-22 (×4): qty 1

## 2021-07-22 MED ORDER — PANTOPRAZOLE SODIUM 40 MG IV SOLR: 40.0000 mg | INTRAVENOUS | Status: DC

## 2021-07-22 MED ORDER — INSULIN ASPART 100 UNIT/ML IJ SOLN
0.0000 [IU] | INTRAMUSCULAR | Status: DC
  Administered 2021-07-22 (×2): 1 [IU] via SUBCUTANEOUS
  Administered 2021-07-22 – 2021-07-23 (×4): 2 [IU] via SUBCUTANEOUS
  Administered 2021-07-23: 3 [IU] via SUBCUTANEOUS
  Administered 2021-07-23: 2 [IU] via SUBCUTANEOUS
  Administered 2021-07-24 (×2): 1 [IU] via SUBCUTANEOUS
  Administered 2021-07-24: 2 [IU] via SUBCUTANEOUS
  Administered 2021-07-24 – 2021-07-25 (×3): 1 [IU] via SUBCUTANEOUS
  Administered 2021-07-25: 3 [IU] via SUBCUTANEOUS
  Administered 2021-07-25 – 2021-07-26 (×2): 2 [IU] via SUBCUTANEOUS

## 2021-07-22 MED ORDER — POLYETHYLENE GLYCOL 3350 17 G PO PACK
17.0000 g | PACK | Freq: Every evening | ORAL | Status: DC
  Administered 2021-07-24 – 2021-07-26 (×2): 17 g via ORAL
  Filled 2021-07-22 (×3): qty 1

## 2021-07-22 MED ORDER — AMIODARONE HCL 200 MG PO TABS
200.0000 mg | ORAL_TABLET | Freq: Every day | ORAL | Status: DC
  Administered 2021-07-22 – 2021-07-27 (×6): 200 mg via ORAL
  Filled 2021-07-22 (×6): qty 1

## 2021-07-22 MED ORDER — SODIUM CHLORIDE 0.9 % IV SOLN: INTRAVENOUS | Status: AC

## 2021-07-22 MED ORDER — PANTOPRAZOLE SODIUM 40 MG IV SOLR
40.0000 mg | Freq: Two times a day (BID) | INTRAVENOUS | Status: DC
  Administered 2021-07-22 – 2021-07-23 (×3): 40 mg via INTRAVENOUS
  Filled 2021-07-22 (×3): qty 10

## 2021-07-22 MED ORDER — MIDODRINE HCL 5 MG PO TABS
10.0000 mg | ORAL_TABLET | Freq: Three times a day (TID) | ORAL | Status: DC
  Administered 2021-07-22 – 2021-07-27 (×14): 10 mg via ORAL
  Filled 2021-07-22 (×13): qty 2

## 2021-07-22 MED ORDER — PROCHLORPERAZINE EDISYLATE 10 MG/2ML IJ SOLN
10.0000 mg | Freq: Four times a day (QID) | INTRAMUSCULAR | Status: DC | PRN
  Administered 2021-07-22 – 2021-07-25 (×6): 10 mg via INTRAVENOUS
  Filled 2021-07-22 (×7): qty 2

## 2021-07-22 MED ORDER — DOCUSATE SODIUM 100 MG PO CAPS
100.0000 mg | ORAL_CAPSULE | Freq: Every day | ORAL | Status: DC
  Administered 2021-07-22 – 2021-07-27 (×5): 100 mg via ORAL
  Filled 2021-07-22 (×6): qty 1

## 2021-07-22 MED ORDER — MORPHINE SULFATE (PF) 2 MG/ML IV SOLN
2.0000 mg | INTRAVENOUS | Status: DC | PRN
  Administered 2021-07-22 – 2021-07-23 (×5): 2 mg via INTRAVENOUS
  Filled 2021-07-22 (×5): qty 1

## 2021-07-22 MED ORDER — ONDANSETRON HCL 4 MG/2ML IJ SOLN
4.0000 mg | Freq: Three times a day (TID) | INTRAMUSCULAR | Status: DC | PRN
Start: 2021-07-22 — End: 2021-07-27
  Administered 2021-07-22 – 2021-07-27 (×6): 4 mg via INTRAVENOUS
  Filled 2021-07-22 (×8): qty 2

## 2021-07-22 MED ORDER — DILTIAZEM HCL ER COATED BEADS 120 MG PO CP24
120.0000 mg | ORAL_CAPSULE | Freq: Every day | ORAL | Status: DC
  Administered 2021-07-22 – 2021-07-27 (×6): 120 mg via ORAL
  Filled 2021-07-22 (×6): qty 1

## 2021-07-22 NOTE — Plan of Care (Signed)
  Problem: Acute Rehab PT Goals(only PT should resolve) Goal: Pt Will Go Supine/Side To Sit Outcome: Progressing Flowsheets (Taken 07/22/2021 1415) Pt will go Supine/Side to Sit:  with min guard assist  with supervision Goal: Patient Will Transfer Sit To/From Stand Outcome: Progressing Flowsheets (Taken 07/22/2021 1415) Patient will transfer sit to/from stand: with minimal assist Goal: Pt Will Transfer Bed To Chair/Chair To Bed Outcome: Progressing Flowsheets (Taken 07/22/2021 1415) Pt will Transfer Bed to Chair/Chair to Bed: with min assist Goal: Pt/caregiver will Perform Home Exercise Program Outcome: Progressing Flowsheets (Taken 07/22/2021 1415) Pt/caregiver will Perform Home Exercise Program:  For increased strengthening  For improved balance  Independently  2:16 PM, 07/22/21 Mearl Latin PT, DPT Physical Therapist at [facility]

## 2021-07-22 NOTE — H&P (View-Only) (Signed)
Gastroenterology Consult   Referring Provider: No ref. provider found Primary Care Physician:  Carrolyn Meiers, MD Primary Gastroenterologist:  Dr. Abbey Chatters  Patient ID: [name]; [id number]; 1945/05/18   Admit date: 07/21/2021  LOS: 1 day   Date of Consultation: 07/22/2021  Reason for Consultation:  Intractable nausea vomiting, rule out esophageal obstruction  History of Present Illness   [name] is a 76 y.o. year old female with history of HTN, A-fib, anxiety/depression, orthostatic hypotension, HLD, type 2 diabetes, prior stroke, history of recurrent DVTs, GERD, and chronic constipation who presented to the ED with 4 days of intractable nausea, vomiting, abdominal pain.  She was previously seen in the ED 3 days ago with abdominal x-ray that was normal was discharged home.  GI consulted due to imaging concerns of possible gastroesophageal obstruction and esophageal debris.  ED course: Noted to be tachypneic but all other vital signs stable. Labs with normal CBC and BMP with the exception of elevated glucose at 121, lipase 22, troponins were negative and UA was normal CT A/P with contrast showing debris in the distal esophagus with moderate distention which could be GERD or distal esophageal obstructing process, small hiatal hernia, large amount of stool in the rectosigmoid colon. She was given IV ceftriaxone, Protonix, Pepcid, morphine, Zofran in the ED  Consult: Last seen in our office at [facility] in October 2022.  She had complaint of daily nausea without vomiting, was on pantoprazole once daily.  Staff stated they had a hard time getting her to eat, patient states she did not eat snacks between meals, would drink Ensure once or twice daily.  She had very little abdominal pain but denied any melena or BRBPR.  Reported daily bowel movements that were loose and had recently been started on insulin which she felt was contributing to her nausea.  Per this office note she  weighed around 127 pounds that was stable for her.  She was offered GES, upper GI series, or upper endoscopy but declined.  She was advised to stop Amitiza and try Trulance 3 mg daily and increase pantoprazole to twice daily.    Patient states her nausea has improved, she has not had any vomiting today.  She continues to have upper abdominal pain with burning sensation up into her chest.  She also complains of frequent belching as of late, denies dysphagia.  States that she has not had much of an appetite at her assisted living facility.  Patient states she feels like she has lost weight, documented weight today 129 pounds, her last office visit in October 2022 she was 127 pounds.  Patient declines any over-the-counter NSAID use, usually takes Tylenol for pain.  She states that she does take a blood thinner due to her stroke she had many years ago however she does not remember when her last dose was.  She also complains of constipation, stating that her stools have been hard even though she is going daily.  She states that she has had some intermittent issues with her hemorrhoids with intermittent toilet tissue hematochezia. Patient states her mouth is dry and would like to try some sips of liquids.   Called [facility] and spoke with Tammy, Mudlogger and confirmed that her most recent dose of Eliquis was 8 am 6/29 and she is currently on pantoprazole 40 mg BID. Reported emesis was clear in nature.  Patient does have a legal guardian, her name is [name].   Last EGD 05/17/2016: Benign-appearing esophageal  stenosis, small hiatal hernia, few gastric polyps, chronic gastritis, nonbleeding duodenal diverticulum.   Last colonoscopy November 2016: three sessile polyps ranging 6 to 8 mm found in the descending, transverse, and ascending colon, moderate internal hemorrhoids found.   Past Medical History:  Diagnosis Date   Anxiety    Arthritis    Atrial fibrillation (HCC)    Atrial flutter ([place])     Collagen vascular disease ([place])    COVID-19 virus infection    COVID-19+ approx 01/24/19; asymptomatic course with full recovery   Dependence on wheelchair    pivot/transfers   Depression    History of psychosis and previous suicide attempt   DVT, lower extremity, recurrent ([place])    Long-term Coumadin per Dr. Legrand Rams   Essential hypertension    GERD (gastroesophageal reflux disease)    Hemiplegia ([place]) 2010   Left side   History of stroke    Acute infarct and right cerebral white matter small vessel disease 12/10   Leg DVT (deep venous thromboembolism), acute ([place]) 2006   Orthostatic hypotension    Schizophrenia ([place])    Stroke ([place])    left sided weakness   Type 2 diabetes mellitus ([place])     Past Surgical History:  Procedure Laterality Date   BACK SURGERY     BIOPSY N/A 11/24/2014   Procedure: BIOPSY;  Surgeon: Danie Binder, MD;  Location: [facility];  Service: Endoscopy;  Laterality: N/A;   BIOPSY  05/17/2016   Procedure: BIOPSY;  Surgeon: Danie Binder, MD;  Location: [facility];  Service: Endoscopy;;  gastric biopsy   COLONOSCOPY WITH PROPOFOL N/A 11/24/2014   Dr. Rudie Meyer polyps removed/moderate sized internal hemorrhoids, tubular adenomas. Next surveillance in 3 years   ESOPHAGOGASTRODUODENOSCOPY (EGD) WITH PROPOFOL N/A 11/24/2014   Dr. Clayburn Pert HH/patent stricture at the gastroesophageal junction, mild non-erosive gastritis, path negative for H.pylori or celiac sprue   ESOPHAGOGASTRODUODENOSCOPY (EGD) WITH PROPOFOL N/A 05/17/2016   Procedure: ESOPHAGOGASTRODUODENOSCOPY (EGD) WITH PROPOFOL;  Surgeon: Danie Binder, MD;  Location: [facility];  Service: Endoscopy;  Laterality: N/A;  12:45pm   GIVENS CAPSULE STUDY N/A 12/11/2014   MULTILPLE EROSION IN the stomach WITH ACTIVE OOZING. OCCASIONAL EROSIONS AND RARE ULCER SEEN IN PROXIMAL SMALL BOWEL . No masses or AVMs SEEN. NO OLD BLOOD OR FRESH BLOOD SEEN.    POLYPECTOMY N/A 11/24/2014   Procedure: POLYPECTOMY;  Surgeon: Danie Binder, MD;  Location: [facility];  Service: Endoscopy;  Laterality: N/A;   SAVORY DILATION N/A 05/17/2016   Procedure: SAVORY DILATION;  Surgeon: Danie Binder, MD;  Location: [facility];  Service: Endoscopy;  Laterality: N/A;    Prior to Admission medications   Medication Sig Start Date End Date Taking? Authorizing Provider  ABILIFY 2 MG tablet Take 1 tablet (2 mg total) by mouth daily. Patient taking differently: Take 2 mg by mouth at bedtime. 11/09/20 07/21/21 Yes [name], MD  acetaminophen (TYLENOL) 325 MG tablet Take 650 mg by mouth 3 (three) times daily as needed for mild pain or moderate pain.    Yes [provider]  albuterol (VENTOLIN HFA) 108 (90 Base) MCG/ACT inhaler Inhale 2 puffs into the lungs every 6 (six) hours as needed for wheezing or shortness of breath. 05/20/21  Yes Barton Dubois, MD  amiodarone (PACERONE) 200 MG tablet Take 1 tablet (200 mg total) by mouth daily. 04/24/21  Yes Barton Dubois, MD  apixaban (ELIQUIS) 5 MG TABS tablet Take 1 tablet (5 mg total) by mouth 2 (  two) times daily. 02/21/20  Yes Richarda Osmond, MD  atorvastatin (LIPITOR) 40 MG tablet Take 1 tablet by mouth at bedtime. 05/30/20  Yes [provider]  busPIRone (BUSPAR) 7.5 MG tablet Take 7.5 mg by mouth 2 (two) times daily. 07/07/21  Yes [provider]  calcium carbonate (TUMS - DOSED IN MG ELEMENTAL CALCIUM) 500 MG chewable tablet Chew 1 tablet by mouth 4 (four) times daily as needed for heartburn.   Yes [provider]  Carboxymethylcellulose Sodium (ARTIFICIAL TEARS OP) Place 1 drop into both eyes 3 (three) times daily.   Yes [provider]  Cholecalciferol (VITAMIN D3) 50 MCG (2000 UT) TABS Take 2,000 Units by mouth daily.   Yes [provider]  clonazePAM (KLONOPIN) 0.5 MG tablet Take 1 tablet (0.5 mg total) by mouth 3 (three) times daily as needed for anxiety. 05/20/21  Yes Barton Dubois, MD  diltiazem (CARDIZEM CD) 120 MG 24 hr capsule  Take 120 mg by mouth daily. 05/30/20  Yes [provider]  DULoxetine (CYMBALTA) 60 MG capsule Take 120 mg by mouth daily.   Yes [provider]  gabapentin (NEURONTIN) 300 MG capsule Take 1 capsule (300 mg total) by mouth every 8 (eight) hours as needed (pain). 05/20/21  Yes Barton Dubois, MD  HYDROcodone-acetaminophen [facility]) 10-325 MG tablet Take 1 tablet by mouth every 8 (eight) hours as needed for severe pain. 05/20/21  Yes Barton Dubois, MD  insulin detemir (LEVEMIR FLEXTOUCH) 100 UNIT/ML FlexPen INJECT 22 UNITS SUBCUTANEOUSLY AT BEDTIME.(HOLD IF BS<70: CALL MD IF BS> 400) 07/15/21  Yes Reardon, Juanetta Beets, NP  latanoprost (XALATAN) 0.005 % ophthalmic solution Place 1 drop into both eyes at bedtime.   Yes [provider]  loratadine (CLARITIN) 10 MG tablet Take 10 mg by mouth daily.   Yes [provider]  metoCLOPramide (REGLAN) 5 MG tablet Take 5 mg by mouth 3 (three) times daily. If Zofran is not working 06/27/21  Yes [provider]  metoprolol succinate (TOPROL-XL) 50 MG 24 hr tablet Take 1 tablet (50 mg total) by mouth daily. Take with or immediately following a meal. 04/01/21  Yes Tat, Shanon Brow, MD  midodrine (PROAMATINE) 10 MG tablet Take 10 mg by mouth 3 (three) times daily.   Yes [provider]  mirtazapine (REMERON) 30 MG tablet Take 30 mg by mouth at bedtime.   Yes [provider]  naloxegol oxalate (MOVANTIK) 12.5 MG TABS tablet Take 1 tablet (12.5 mg total) by mouth daily. 04/24/21  Yes Barton Dubois, MD  NOVOLOG FLEXPEN 100 UNIT/ML FlexPen INJECT SUBCUTANEOUSLY AS FOLLOWS WITH MEALS: 90-150=10u: 151-200=11u: 201-250=12u: 251-300=13u: 301-350=14u: 351-400=15u: BS>400=16u & CALL MD. Patient taking differently: 10-16 Units 3 (three) times daily with meals. INJECT SUBCUTANEOUSLY AS FOLLOWS WITH MEALS: 90-150=10u: 151-200=11u: 201-250=12u: 251-300=13u: 301-350=14u: 351-400=15u: BS>400=16u & CALL MD. 02/28/21  Yes Brita Romp, NP   ondansetron (ZOFRAN) 4 MG tablet Take by mouth 2 (two) times daily. 06/24/21  Yes [provider]  oxybutynin (DITROPAN-XL) 5 MG 24 hr tablet Take 5 mg by mouth daily. 04/20/21  Yes [provider]  pantoprazole (PROTONIX) 40 MG tablet Take 1 tablet (40 mg total) by mouth 2 (two) times daily before a meal. 11/17/20  Yes Mahala Menghini, PA-C  polyethylene glycol (MIRALAX / GLYCOLAX) 17 g packet Take 17 g by mouth daily. Patient taking differently: Take 17 g by mouth daily as needed for mild constipation. 04/24/21  Yes Barton Dubois, MD  risperiDONE (RISPERDAL) 1 MG tablet Take  1-2 mg by mouth 2 (two) times daily. Take 1 tablet by mouth in the morning. Take 2 tablets by mouth at bedtime. 04/04/21  Yes [provider]  SANTYL 250 UNIT/GM ointment Apply 1 Application topically daily. 07/05/21  Yes [provider]  senna (SENOKOT) 8.6 MG tablet Take 1 tablet by mouth daily.   Yes [provider]  TRADJENTA 5 MG TABS tablet TAKE (1) TABLET BY MOUTH ONCE DAILY. Patient taking differently: Take 5 mg by mouth daily. 04/04/21  Yes Reardon, Juanetta Beets, NP  bisacodyl (DULCOLAX) 5 MG EC tablet Take 1 tablet (5 mg total) by mouth daily as needed for moderate constipation or mild constipation. Patient not taking: Reported on 07/21/2021 04/24/21 04/24/22  Barton Dubois, MD  [facility] TEST test strip USE TO CHECK BLOOD SUGAR FOUR TIMES DAILY.(HOLD IF BS<70: CALL MD IF BS> 400) 07/21/21   Brita Romp, NP  GOODSENSE ARTIFICIAL TEARS 0.5-0.6 % SOLN Apply to eye. 06/03/21   [provider]    Current Facility-Administered Medications  Medication Dose Route Frequency Provider Last Rate Last Admin   0.9 %  sodium chloride infusion   Intravenous Continuous Adefeso, Oladapo, DO 75 mL/hr at 07/22/21 0845 New Bag at 07/22/21 0845   insulin aspart (novoLOG) injection 0-9 Units  0-9 Units Subcutaneous Q4H Adefeso, Oladapo, DO   2 Units at 07/22/21 0845   morphine (PF) 2 MG/ML  injection 2 mg  2 mg Intravenous Q4H PRN Adefeso, Oladapo, DO   2 mg at 07/22/21 2831   pantoprazole (PROTONIX) injection 40 mg  40 mg Intravenous Q12H Adefeso, Oladapo, DO   40 mg at 07/22/21 0845   prochlorperazine (COMPAZINE) injection 10 mg  10 mg Intravenous Q6H PRN Adefeso, Oladapo, DO        Allergies as of 07/21/2021 - Review Complete 07/21/2021  Allergen Reaction Noted   Sulfa antibiotics Rash 02/23/2010    Family History  Problem Relation Age of Onset   Hypertension Mother    Colon cancer Neg Hx     Social History   Socioeconomic History   Marital status: Widowed    Spouse name: Not on file   Number of children: Not on file   Years of education: Not on file   Highest education level: Not on file  Occupational History   Not on file  Tobacco Use   Smoking status: Former    Packs/day: 0.25    Years: 20.00    Total pack years: 5.00    Types: Cigarettes    Quit date: 01/24/1995    Years since quitting: 26.5   Smokeless tobacco: Never  Vaping Use   Vaping Use: Never used  Substance and Sexual Activity   Alcohol use: No    Alcohol/week: 0.0 standard drinks of alcohol   Drug use: No   Sexual activity: Never    Birth control/protection: Post-menopausal  Other Topics Concern   Not on file  Social History Narrative   Not on file   Social Determinants of Health   Financial Resource Strain: Not on file  Food Insecurity: Not on file  Transportation Needs: Not on file  Physical Activity: Not on file  Stress: Not on file  Social Connections: Not on file  Intimate Partner Violence: Not on file     Review of Systems   Gen: Denies any fever, chills, loss of appetite, change in weight or weight loss CV: Denies chest pain, heart palpitations, syncope, edema  Resp: Denies shortness of breath with rest,  cough, wheezing, coughing up blood, and pleurisy. GI: see HPI GU : Denies urinary burning, blood in urine, urinary frequency, and urinary incontinence. MS: Denies  joint pain, limitation of movement, swelling, cramps, and atrophy.  Derm: Denies rash, itching, dry skin, hives. Psych: Denies depression, anxiety, memory loss, hallucinations, and confusion. Heme: Denies bruising or bleeding Neuro:  Denies any headaches, dizziness, paresthesias, shaking  Physical Exam   Vital Signs in last 24 hours: Temp:  [98.2 F (36.8 C)-98.4 F (36.9 C)] 98.4 F (36.9 C) (06/30 0507) Pulse Rate:  [69-113] 69 (06/30 0507) Resp:  [18-24] 18 (06/30 0507) BP: (110-139)/(56-98) 139/98 (06/30 0507) SpO2:  [95 %-100 %] 100 % (06/30 0507) Weight:  [58.7 kg-60.8 kg] 58.7 kg (06/30 0058) Last BM Date : 07/20/21  General:   Alert,  Well-developed, well-nourished, pleasant and cooperative in NAD Head:  Normocephalic and atraumatic. Eyes:  Sclera clear, no icterus.   Conjunctiva pink. Ears:  Normal auditory acuity. Mouth:  missing teeth, dentures at facility, dry mucous membranes Lungs:  Clear throughout to auscultation.   No wheezes, crackles, or rhonchi. No acute distress. Heart:  Regular rate and rhythm; no murmurs, clicks, rubs,  or gallops. Abdomen:  Soft, non-distended. No masses, hepatosplenomegaly or hernias noted. Normal bowel sounds, without guarding, and without rebound.   Rectal:  deferred. Extremities:  Without clubbing or edema. Neurologic:  Alert and oriented x4. Slightly forgetful.  Skin:  Intact without significant lesions or rashes. Psych:  Alert and cooperative. Normal mood and affect.  Intake/Output from previous day: No intake/output data recorded. Intake/Output this shift: No intake/output data recorded.   Labs/Studies   Recent Labs Recent Labs    07/21/21 2013 07/22/21 0530  WBC 8.0 7.3  HGB 12.7 11.7*  HCT 39.3 35.8*  PLT 278 237   BMET Recent Labs    07/21/21 2013 07/22/21 0530  NA 137 138  K 4.1 4.2  CL 99 102  CO2 30 31  GLUCOSE 121* 162*  BUN 15 12  CREATININE 0.77 0.77  CALCIUM 9.0 8.9   LFT Recent Labs     07/21/21 2013 07/22/21 0530  PROT 8.0 6.9  ALBUMIN 4.1 3.4*  AST 17 12*  ALT 13 11  ALKPHOS 75 64  BILITOT 0.5 0.4   PT/INR No results for input(s): "LABPROT", "INR" in the last 72 hours. Hepatitis Panel No results for input(s): "HEPBSAG", "HCVAB", "HEPAIGM", "HEPBIGM" in the last 72 hours. C-Diff No results for input(s): "CDIFFTOX" in the last 72 hours.  Radiology/Studies CT ABDOMEN PELVIS W CONTRAST  Result Date: 07/21/2021 CLINICAL DATA:  Epigastric pain. EXAM: CT ABDOMEN AND PELVIS WITH CONTRAST TECHNIQUE: Multidetector CT imaging of the abdomen and pelvis was performed using the standard protocol following bolus administration of intravenous contrast. RADIATION DOSE REDUCTION: This exam was performed according to the departmental dose-optimization program which includes automated exposure control, adjustment of the mA and/or kV according to patient size and/or use of iterative reconstruction technique. CONTRAST:  17m OMNIPAQUE IOHEXOL 300 MG/ML  SOLN COMPARISON:  CT abdomen and pelvis 04/23/2021 FINDINGS: Lower chest: There is a calcified granuloma in the right lower lobe, unchanged. No acute findings. Hepatobiliary: Hepatic hemangioma measuring up to 3.3 cm appears unchanged. No new liver lesions. Gallstones are again noted. There is no bile duct dilatation. Pancreas: Unremarkable. No pancreatic ductal dilatation or surrounding inflammatory changes. Spleen: Normal in size without focal abnormality. Adrenals/Urinary Tract: Adrenal glands are unremarkable. Kidneys are normal, without renal calculi, focal lesion, or hydronephrosis. Bladder is unremarkable. Stomach/Bowel:  Appendix appears normal. No evidence of bowel wall thickening, distention, or inflammatory changes. There is a large amount of stool in the rectosigmoid colon. There is a small hiatal hernia. The remaining stomach is within normal limits. There is distention and debris in the distal esophagus. Vascular/Lymphatic: Aortic  atherosclerosis. No enlarged abdominal or pelvic lymph nodes. Reproductive: Uterus and bilateral adnexa are unremarkable. Other: No abdominal wall hernia or abnormality. No abdominopelvic ascites. Musculoskeletal: Degenerative changes affect the spine. There is stable grade 1 anterolisthesis at L4-L5. IMPRESSION: 1. There is debris in the distal esophagus with moderate distention. This can be seen with gastroesophageal reflux. Distal esophageal obstructing process not excluded. Please correlate clinically. 2. Small hiatal hernia. 3. Large amount of stool in the rectosigmoid colon. 4. Cholelithiasis. 5.  Aortic Atherosclerosis (ICD10-I70.0). Electronically Signed   By: Ronney Asters M.D.   On: 07/21/2021 22:39   DG Chest 2 View  Result Date: 07/21/2021 CLINICAL DATA:  Chest pain EXAM: CHEST - 2 VIEW COMPARISON:  07/18/2021, 07/07/2021 FINDINGS: Cardiomegaly. Increased opacity at the left lung base. Aortic atherosclerosis. No pneumothorax IMPRESSION: 1. Cardiomegaly with interim mild airspace disease at the peripheral left base, atelectasis versus pneumonia Electronically Signed   By: Donavan Foil M.D.   On: 07/21/2021 21:18     Assessment   [name] is a 76 y.o. year old female with history of HTN, A-fib, anxiety/depression, orthostatic hypotension, HLD, type 2 diabetes, prior stroke, history of recurrent DVTs, GERD, and chronic constipation who presented to the ED with 4 days of intractable nausea, vomiting, abdominal pain.  She was previously seen in the ED 3 days ago with abdominal x-ray that was normal was discharged home.  GI consulted due to imaging concerns of possible gastroesophageal obstruction and esophageal debris.   GERD/intractable nausea,vomiting,abdominal pain: Nausea has been chronic for patient, with intermittent vomiting.  She was previously seen outpatient for evaluation of this and declined upper endoscopy, upper GI series, or gastric emptying study.  She has been on PPI twice  daily for reflux outpatient that was recently increased in October 2022.  There was previously concerns for weight loss due to lack of appetite and nausea at her facility, however weight appears to have been stable and per chart review she currently weighs 129 pounds, was previously 127 pounds in October.  She has been using Zofran as needed for nausea.  Prior to her last outpatient visit she was started on insulin for her diabetes which likely could be contributing to her symptoms.  She denies any dysphagia but has been experiencing frequent belching as well as burning epigastric pain radiating into her chest.  Spoke to director at [facility] who confirmed that she has been on PPI twice daily, has not had any regular NSAIDs, and her emesis has been clear in nature.  Patient herself denies any issues with dysphagia.  Last EGD in 2018 noting esophageal stenosis. Given CT A/P findings with esophageal debris and esophageal distention possibly representing reflux or esophageal obstruction would recommend pursuing upper endoscopy with possible dilation for further evaluation.  Differentials include worsening reflux, gastritis, peptic ulcer disease, gastritis, esophageal stenosis, esophageal ring or webbing.  Patient is chronically anticoagulated with Eliquis and her last dose was yesterday morning 6/29 at 8 AM.  Earliest EGD/EGD able to be performed as tomorrow morning to allow for 48-hour washout of Eliquis.  Patient has legal guardian named [name] that should be contacted for consent.  Constipation: Patient states she is having daily bowel  movements at her facility, however they are very hard and she has been having to strain frequently.  This has been causing intermittent hemorrhoidal issues for her.  If she is able to tolerate p.o. intake will attempt at MiraLAX twice daily and Colace 100 mg daily.  Patient has a legal guardian - [name].  I attempted to contact her on her cell phone at  [phone] to speak with her regarding consent for possible EGD tomorrow. She called back reaching the nursing staff who gave her informed consent, I called her back and verified consent and she denied any questions about the procedure. She stated family would be visiting the patient tomorrow.    Plan / Recommendations   Antiemetics and pain medication per hospitalist Continue to hold Eliquis Trial of clear liquids today N.p.o. at midnight EGD/ED tomorrow with Dr. Abbey Chatters, consent obtained.  Continue PPI BID Miralax 17 g nightly and colace 100 mg daily      07/22/2021, 9:43 AM  Venetia Night, MSN, FNP-BC, AGACNP-BC [facility]

## 2021-07-22 NOTE — H&P (Signed)
History and Physical    Patient: [name] UXN:[id number] DOB: 02/24/1945 DOA: 07/21/2021 DOS: the patient was seen and examined on 07/22/2021 PCP: Carrolyn Meiers, MD  Patient coming from: Home  Chief Complaint:  Chief Complaint  Patient presents with   Abdominal Pain   HPI: [name] is a 76 y.o. female with medical history significant of hypertension, paroxysmal atrial fibrillation, anxiety/depression, orthostatic hypotension, hyperlipidemia, T2DM, prior history of stroke, history of recurrent lower extremity DVT, GERD who presents to the emergency department due to 4-day onset of intractable nausea, vomiting and abdominal pain.  She states that she presented to the emergency department 3 days ago and abdominal x-ray done at that time was normal and she was discharged home.  She complained of epigastric pain with burning sensation radiation to the chest.  She denies fever, chills, shortness of breath.   ED Course:  In the emergency department, she was tachypneic, but other vital signs were within normal range.  Work-up in the ED showed normal CBC and BMP except for blood glucose at 121, lipase 22, troponin x2 was negative, urinalysis was normal. CT abdomen and pelvis with contrast showed 1. There is debris in the distal esophagus with moderate distention. This can be seen with gastroesophageal reflux. Distal esophageal obstructing process not excluded. Please correlate clinically. 2. Small hiatal hernia. 3. Large amount of stool in the rectosigmoid colon. 4. Cholelithiasis. 5.  Aortic Atherosclerosis Chest x-ray showed cardiomegaly with interim mild airspace disease at the peripheral left base, atelectasis versus pneumonia. Patient was treated with IV ceftriaxone.  Protonix, Pepcid, morphine and Zofran were given.  Gastroenterology (Dr. Abbey Chatters) was consulted and will see patient in the morning per ED physician.  Hospitalist was asked to admit patient for further evaluation  and management.   Review of Systems: Review of systems as noted in the HPI. All other systems reviewed and are negative.   Past Medical History:  Diagnosis Date   Anxiety    Arthritis    Atrial fibrillation (HCC)    Atrial flutter ([place])    Collagen vascular disease ([place])    COVID-19 virus infection    COVID-19+ approx 01/24/19; asymptomatic course with full recovery   Dependence on wheelchair    pivot/transfers   Depression    History of psychosis and previous suicide attempt   DVT, lower extremity, recurrent ([place])    Long-term Coumadin per Dr. Legrand Rams   Essential hypertension    GERD (gastroesophageal reflux disease)    Hemiplegia ([place]) 2010   Left side   History of stroke    Acute infarct and right cerebral white matter small vessel disease 12/10   Leg DVT (deep venous thromboembolism), acute ([place]) 2006   Orthostatic hypotension    Schizophrenia ([place])    Stroke ([place])    left sided weakness   Type 2 diabetes mellitus ([place])    Past Surgical History:  Procedure Laterality Date   BACK SURGERY     BIOPSY N/A 11/24/2014   Procedure: BIOPSY;  Surgeon: Danie Binder, MD;  Location: [facility];  Service: Endoscopy;  Laterality: N/A;   BIOPSY  05/17/2016   Procedure: BIOPSY;  Surgeon: Danie Binder, MD;  Location: [facility];  Service: Endoscopy;;  gastric biopsy   COLONOSCOPY WITH PROPOFOL N/A 11/24/2014   Dr. Rudie Meyer polyps removed/moderate sized internal hemorrhoids, tubular adenomas. Next surveillance in 3 years   ESOPHAGOGASTRODUODENOSCOPY (EGD) WITH PROPOFOL N/A 11/24/2014   Dr. Clayburn Pert HH/patent stricture at the gastroesophageal junction,  mild non-erosive gastritis, path negative for H.pylori or celiac sprue   ESOPHAGOGASTRODUODENOSCOPY (EGD) WITH PROPOFOL N/A 05/17/2016   Procedure: ESOPHAGOGASTRODUODENOSCOPY (EGD) WITH PROPOFOL;  Surgeon: Danie Binder, MD;  Location: [facility];  Service: Endoscopy;  Laterality: N/A;  12:45pm   GIVENS CAPSULE STUDY N/A 12/11/2014    MULTILPLE EROSION IN the stomach WITH ACTIVE OOZING. OCCASIONAL EROSIONS AND RARE ULCER SEEN IN PROXIMAL SMALL BOWEL . No masses or AVMs SEEN. NO OLD BLOOD OR FRESH BLOOD SEEN.    POLYPECTOMY N/A 11/24/2014   Procedure: POLYPECTOMY;  Surgeon: Danie Binder, MD;  Location: [facility];  Service: Endoscopy;  Laterality: N/A;   SAVORY DILATION N/A 05/17/2016   Procedure: SAVORY DILATION;  Surgeon: Danie Binder, MD;  Location[facility];  Service: Endoscopy;  Laterality: N/A;    Social History:  reports that she quit smoking about 26 years ago. Her smoking use included cigarettes. She has a 5.00 pack-year smoking history. She has never used smokeless tobacco. She reports that she does not drink alcohol and does not use drugs.   Allergies  Allergen Reactions   Sulfa Antibiotics Rash    Family History  Problem Relation Age of Onset   Hypertension Mother    Colon cancer Neg Hx      Prior to Admission medications   Medication Sig Start Date End Date Taking? Authorizing Provider  ABILIFY 2 MG tablet Take 1 tablet (2 mg total) by mouth daily. Patient taking differently: Take 2 mg by mouth at bedtime. 11/09/20 07/21/21 Yes [name], MD  acetaminophen (TYLENOL) 325 MG tablet Take 650 mg by mouth 3 (three) times daily as needed for mild pain or moderate pain.    Yes [provider]  albuterol (VENTOLIN HFA) 108 (90 Base) MCG/ACT inhaler Inhale 2 puffs into the lungs every 6 (six) hours as needed for wheezing or shortness of breath. 05/20/21  Yes Barton Dubois, MD  amiodarone (PACERONE) 200 MG tablet Take 1 tablet (200 mg total) by mouth daily. 04/24/21  Yes Barton Dubois, MD  apixaban (ELIQUIS) 5 MG TABS tablet Take 1 tablet (5 mg total) by mouth 2 (two) times daily. 02/21/20  Yes Richarda Osmond, MD  atorvastatin (LIPITOR) 40 MG tablet Take 1 tablet by mouth at bedtime. 05/30/20  Yes [provider]  busPIRone (BUSPAR) 7.5 MG tablet Take 7.5 mg by mouth 2 (two) times daily.  07/07/21  Yes [provider]  calcium carbonate (TUMS - DOSED IN MG ELEMENTAL CALCIUM) 500 MG chewable tablet Chew 1 tablet by mouth 4 (four) times daily as needed for heartburn.   Yes [provider]  Carboxymethylcellulose Sodium (ARTIFICIAL TEARS OP) Place 1 drop into both eyes 3 (three) times daily.   Yes [provider]  Cholecalciferol (VITAMIN D3) 50 MCG (2000 UT) TABS Take 2,000 Units by mouth daily.   Yes [provider]  clonazePAM (KLONOPIN) 0.5 MG tablet Take 1 tablet (0.5 mg total) by mouth 3 (three) times daily as needed for anxiety. 05/20/21  Yes Barton Dubois, MD  diltiazem (CARDIZEM CD) 120 MG 24 hr capsule Take 120 mg by mouth daily. 05/30/20  Yes [provider]  DULoxetine (CYMBALTA) 60 MG capsule Take 120 mg by mouth daily.   Yes [provider]  gabapentin (NEURONTIN) 300 MG capsule Take 1 capsule (300 mg total) by mouth every 8 (eight) hours as needed (pain). 05/20/21  Yes Barton Dubois, MD  HYDROcodone-acetaminophen [facility]) 10-325 MG tablet Take 1 tablet by mouth  every 8 (eight) hours as needed for severe pain. 05/20/21  Yes Barton Dubois, MD  insulin detemir (LEVEMIR FLEXTOUCH) 100 UNIT/ML FlexPen INJECT 22 UNITS SUBCUTANEOUSLY AT BEDTIME.(HOLD IF BS<70: CALL MD IF BS> 400) 07/15/21  Yes Reardon, Juanetta Beets, NP  latanoprost (XALATAN) 0.005 % ophthalmic solution Place 1 drop into both eyes at bedtime.   Yes [provider]  loratadine (CLARITIN) 10 MG tablet Take 10 mg by mouth daily.   Yes [provider]  metoCLOPramide (REGLAN) 5 MG tablet Take 5 mg by mouth 3 (three) times daily. If Zofran is not working 06/27/21  Yes [provider]  metoprolol succinate (TOPROL-XL) 50 MG 24 hr tablet Take 1 tablet (50 mg total) by mouth daily. Take with or immediately following a meal. 04/01/21  Yes Tat, Shanon Brow, MD  midodrine (PROAMATINE) 10 MG tablet Take 10 mg by mouth 3 (three) times daily.   Yes [provider]  mirtazapine (REMERON) 30 MG tablet Take 30 mg by mouth at bedtime.   Yes [provider]  naloxegol oxalate (MOVANTIK) 12.5 MG TABS tablet Take 1 tablet (12.5 mg total) by mouth daily. 04/24/21  Yes Barton Dubois, MD  NOVOLOG FLEXPEN 100 UNIT/ML FlexPen INJECT SUBCUTANEOUSLY AS FOLLOWS WITH MEALS: 90-150=10u: 151-200=11u: 201-250=12u: 251-300=13u: 301-350=14u: 351-400=15u: BS>400=16u & CALL MD. Patient taking differently: 10-16 Units 3 (three) times daily with meals. INJECT SUBCUTANEOUSLY AS FOLLOWS WITH MEALS: 90-150=10u: 151-200=11u: 201-250=12u: 251-300=13u: 301-350=14u: 351-400=15u: BS>400=16u & CALL MD. 02/28/21  Yes Brita Romp, NP  ondansetron (ZOFRAN) 4 MG tablet Take by mouth 2 (two) times daily. 06/24/21  Yes [provider]  oxybutynin (DITROPAN-XL) 5 MG 24 hr tablet Take 5 mg by mouth daily. 04/20/21  Yes [provider]  pantoprazole (PROTONIX) 40 MG tablet Take 1 tablet (40 mg total) by mouth 2 (two) times daily before a meal. 11/17/20  Yes Mahala Menghini, PA-C  polyethylene glycol (MIRALAX / GLYCOLAX) 17 g packet Take 17 g by mouth daily. Patient taking differently: Take 17 g by mouth daily as needed for mild constipation. 04/24/21  Yes Barton Dubois, MD  risperiDONE (RISPERDAL) 1 MG tablet Take 1-2 mg by mouth 2 (two) times daily. Take 1 tablet by mouth in the morning. Take 2 tablets by mouth at bedtime. 04/04/21  Yes [provider]  SANTYL 250 UNIT/GM ointment Apply 1 Application topically daily. 07/05/21  Yes [provider]  senna (SENOKOT) 8.6 MG tablet Take 1 tablet by mouth daily.   Yes [provider]  TRADJENTA 5 MG TABS tablet TAKE (1) TABLET BY MOUTH ONCE DAILY. Patient taking differently: Take 5 mg by mouth daily. 04/04/21  Yes Reardon, Juanetta Beets, NP  bisacodyl (DULCOLAX) 5 MG EC tablet Take 1 tablet (5 mg total) by mouth daily as needed for moderate constipation or mild constipation. Patient not taking:  Reported on 07/21/2021 04/24/21 04/24/22  Barton Dubois, MD  [facility] TEST test strip USE TO CHECK BLOOD SUGAR FOUR TIMES DAILY.(HOLD IF BS<70: CALL MD IF BS> 400) 07/21/21   Brita Romp, NP  GOODSENSE ARTIFICIAL TEARS 0.5-0.6 % SOLN Apply to eye. 06/03/21   [provider]    Physical Exam: BP (!) 139/98 (BP Location: Left Arm)   Pulse 69   Temp 98.4 F (36.9 C) (Oral)   Resp 18   Ht '5\' 7"'$  (1.702 m)   Wt 58.7 kg   SpO2 100%   BMI 20.27 kg/m   General: 76 y.o. year-old female well developed well nourished  in no acute distress.  Alert and oriented x3. HEENT: NCAT, EOMI Neck: Supple, trachea medial Cardiovascular: Regular rate and rhythm with no rubs or gallops.  No thyromegaly or JVD noted.  No lower extremity edema. 2/4 pulses in all 4 extremities. Respiratory: Clear to auscultation with no wheezes or rales. Good inspiratory effort. Abdomen: Soft, tender palpation of epigastric area without guarding. Normal bowel sounds x4 quadrants. Muskuloskeletal: No cyanosis, clubbing or edema noted bilaterally Neuro: CN II-XII intact, strength 5/5 x 4, sensation, reflexes intact Skin: No ulcerative lesions noted or rashes Psychiatry: Judgement and insight appear normal. Mood is appropriate for condition and setting          Labs on Admission:  Basic Metabolic Panel: Recent Labs  Lab 07/18/21 1502 07/21/21 2013 07/22/21 0530  NA 135 137 138  K 3.9 4.1 4.2  CL 97* 99 102  CO2 32 30 31  GLUCOSE 153* 121* 162*  BUN '15 15 12  '$ CREATININE 0.79 0.77 0.77  CALCIUM 8.9 9.0 8.9  MG  --   --  2.1  PHOS  --   --  3.5   Liver Function Tests: Recent Labs  Lab 07/18/21 1502 07/21/21 2013 07/22/21 0530  AST 12* 17 12*  ALT '11 13 11  '$ ALKPHOS 76 75 64  BILITOT 0.5 0.5 0.4  PROT 7.7 8.0 6.9  ALBUMIN 3.8 4.1 3.4*   Recent Labs  Lab 07/18/21 1502 07/21/21 2013  LIPASE 22 22   No results for input(s): "AMMONIA" in the last 168 hours. CBC: Recent Labs  Lab 07/18/21 1502  07/21/21 2013 07/22/21 0530  WBC 7.0 8.0 7.3  NEUTROABS 4.9  --   --   HGB 12.4 12.7 11.7*  HCT 38.6 39.3 35.8*  MCV 87.9 87.1 86.9  PLT 280 278 237   Cardiac Enzymes: No results for input(s): "CKTOTAL", "CKMB", "CKMBINDEX", "TROPONINI" in the last 168 hours.  BNP (last 3 results) Recent Labs    03/22/21 0938 03/27/21 0357 05/18/21 0923  BNP 365.0* 276.0* 348.0*    ProBNP (last 3 results) No results for input(s): "PROBNP" in the last 8760 hours.  CBG: No results for input(s): "GLUCAP" in the last 168 hours.  Radiological Exams on Admission: CT ABDOMEN PELVIS W CONTRAST  Result Date: 07/21/2021 CLINICAL DATA:  Epigastric pain. EXAM: CT ABDOMEN AND PELVIS WITH CONTRAST TECHNIQUE: Multidetector CT imaging of the abdomen and pelvis was performed using the standard protocol following bolus administration of intravenous contrast. RADIATION DOSE REDUCTION: This exam was performed according to the departmental dose-optimization program which includes automated exposure control, adjustment of the mA and/or kV according to patient size and/or use of iterative reconstruction technique. CONTRAST:  183m OMNIPAQUE IOHEXOL 300 MG/ML  SOLN COMPARISON:  CT abdomen and pelvis 04/23/2021 FINDINGS: Lower chest: There is a calcified granuloma in the right lower lobe, unchanged. No acute findings. Hepatobiliary: Hepatic hemangioma measuring up to 3.3 cm appears unchanged. No new liver lesions. Gallstones are again noted. There is no bile duct dilatation. Pancreas: Unremarkable. No pancreatic ductal dilatation or surrounding inflammatory changes. Spleen: Normal in size without focal abnormality. Adrenals/Urinary Tract: Adrenal glands are unremarkable. Kidneys are normal, without renal calculi, focal lesion, or hydronephrosis. Bladder is unremarkable. Stomach/Bowel: Appendix appears normal. No evidence of bowel wall thickening, distention, or inflammatory changes. There is a large amount of stool in the  rectosigmoid colon. There is a small hiatal hernia. The remaining stomach is within normal limits. There is distention and debris in the distal esophagus. Vascular/Lymphatic: Aortic  atherosclerosis. No enlarged abdominal or pelvic lymph nodes. Reproductive: Uterus and bilateral adnexa are unremarkable. Other: No abdominal wall hernia or abnormality. No abdominopelvic ascites. Musculoskeletal: Degenerative changes affect the spine. There is stable grade 1 anterolisthesis at L4-L5. IMPRESSION: 1. There is debris in the distal esophagus with moderate distention. This can be seen with gastroesophageal reflux. Distal esophageal obstructing process not excluded. Please correlate clinically. 2. Small hiatal hernia. 3. Large amount of stool in the rectosigmoid colon. 4. Cholelithiasis. 5.  Aortic Atherosclerosis (ICD10-I70.0). Electronically Signed   By: Ronney Asters M.D.   On: 07/21/2021 22:39   DG Chest 2 View  Result Date: 07/21/2021 CLINICAL DATA:  Chest pain EXAM: CHEST - 2 VIEW COMPARISON:  07/18/2021, 07/07/2021 FINDINGS: Cardiomegaly. Increased opacity at the left lung base. Aortic atherosclerosis. No pneumothorax IMPRESSION: 1. Cardiomegaly with interim mild airspace disease at the peripheral left base, atelectasis versus pneumonia Electronically Signed   By: Donavan Foil M.D.   On: 07/21/2021 21:18    EKG: I independently viewed the EKG done and my findings are as followed: A-fib with rate control and QTc 507 ms  Assessment/Plan Present on Admission:  Intractable nausea and vomiting  DVT, lower extremity, recurrent (HCC)  Mixed hyperlipidemia  Principal Problem:   Intractable nausea and vomiting Active Problems:   Type 2 diabetes mellitus ([place])   Essential hypertension   DVT, lower extremity, recurrent (HCC)   Mixed hyperlipidemia   Abdominal pain   Stricture and stenosis of esophagus   Prolonged QT interval   Chronic atrial fibrillation with RVR (HCC)  Abdominal pain, intractable  nausea and vomiting Continue IV NS at 75 mLs/Hr Continue IV Protonix Continue IV morphine 2 mg q.4h p.r.n. for moderate to severe pain Continue IV Compazine p.r.n. Continue n.p.o. Gastroenterology will be consulted to follow up with patient in the morning  Possible esophageal obstruction CT abdomen and pelvis was suggestive of distal esophageal obstructing process  Gastroenterology will be consulted  GERD Continue Protonix  Prolonged QTc QTc 507 ms Avoid QT prolonging drugs Magnesium level will be checked Continue telemetry  Chronic atrial fibrillation Rate currently controlled Continue amiodarone, Cardizem Resume home Eliquis after anticipated GI procedure  History of CVA Continue Lipitor; resume Eliquis after anticipated GI procedure  Lower extremity DVT Resume home Eliquis after anticipated GI procedure  Essential hypertension Continue home meds  Mixed hyperlipidemia Continue Lipitor  Type 2 diabetes mellitus ISS and hypoglycemic protocol   DVT prophylaxis: SCDs (resume home Eliquis after anticipated GI procedure)  Code Status: Full code   Consults: Gastroenterology  Family Communication: None at bedside  Severity of Illness: The appropriate patient status for this patient is INPATIENT. Inpatient status is judged to be reasonable and necessary in order to provide the required intensity of service to ensure the patient's safety. The patient's presenting symptoms, physical exam findings, and initial radiographic and laboratory data in the context of their chronic comorbidities is felt to place them at high risk for further clinical deterioration. Furthermore, it is not anticipated that the patient will be medically stable for discharge from the hospital within 2 midnights of admission.   * I certify that at the point of admission it is my clinical judgment that the patient will require inpatient hospital care spanning beyond 2 midnights from the point of  admission due to high intensity of service, high risk for further deterioration and high frequency of surveillance required.*  Author: Bernadette Hoit, DO 07/22/2021 7:18 AM  For on call review [email or web address].

## 2021-07-22 NOTE — Progress Notes (Signed)
Initial Nutrition Assessment  DOCUMENTATION CODES:      INTERVENTION:  Apple juice mixed with gingerale ad lib from nourishment room  When pt is tolerating diet will address further nutrition recommendations  NUTRITION DIAGNOSIS:   Inadequate oral intake related to nausea, vomiting as evidenced by per patient/family report.  GOAL:  Patient will meet greater than or equal to 90% of their needs  MONITOR:  Diet advancement, Labs, I & O's, Weight trends  REASON FOR ASSESSMENT:   Malnutrition Screening Tool    ASSESSMENT: Patient is a 76 yo female with hx of DM2, HTN, GERD, chronic constipation. Presents from [facility] with intractable nausea and vomiting.   Patient is not tolerating clears today. Patient says she tried to drink sips of liquids but vomited. She likes apple juice and drinks prune juice and Carnation Instant breakfast. Patient is edentulous. Independent with feeding when well.   NPO after midnight for EGD/ED tomorrow per GI.  Weight history stable- currently ~58.7 kg (129 lb). Patient ambulates with walker or wheelchair at facility.   At risk for malnutrition and unwanted weight loss due to ongoing vomiting.   Medications: Miralax, colace and PPI-BID     Latest Ref Rng & Units 07/22/2021    5:30 AM 07/21/2021    8:13 PM 07/18/2021    3:02 PM  BMP  Glucose 70 - 99 mg/dL 162  121  153   BUN 8 - 23 mg/dL '12  15  15   '$ Creatinine 0.44 - 1.00 mg/dL 0.77  0.77  0.79   Sodium 135 - 145 mmol/L 138  137  135   Potassium 3.5 - 5.1 mmol/L 4.2  4.1  3.9   Chloride 98 - 111 mmol/L 102  99  97   CO2 22 - 32 mmol/L 31  30  32   Calcium 8.9 - 10.3 mg/dL 8.9  9.0  8.9      NUTRITION - FOCUSED PHYSICAL EXAM:  Flowsheet Row Most Recent Value  Orbital Region Mild depletion  Upper Arm Region No depletion  Thoracic and Lumbar Region No depletion  Buccal Region Mild depletion  Temple Region No depletion  Clavicle Bone Region Mild depletion  Clavicle and Acromion Bone  Region No depletion  Scapular Bone Region No depletion  Dorsal Hand No depletion  Patellar Region Mild depletion  Anterior Thigh Region Mild depletion  Edema (RD Assessment) Mild  Hair Reviewed  Eyes Reviewed  Mouth Reviewed  Skin Reviewed  Nails Reviewed       Diet Order:   Diet Order             Diet NPO time specified  Diet effective midnight           Diet clear liquid Room service appropriate? Yes; Fluid consistency: Thin  Diet effective now                   EDUCATION NEEDS:  Education needs have been addressed  Skin:  Skin Assessment: Reviewed RN Assessment  Last BM:  6/28  Height:   Ht Readings from Last 1 Encounters:  07/22/21 '5\' 7"'$  (1.702 m)    Weight:   Wt Readings from Last 1 Encounters:  07/22/21 58.7 kg    Ideal Body Weight:   67 kg  BMI:  Body mass index is 20.27 kg/m.  Estimated Nutritional Needs:   Kcal:  1500-1700  Protein:  70-75 gr  Fluid:  1600 ml daily  Colman Cater MS,RD,CSG,LDN Contact: Shea Evans

## 2021-07-22 NOTE — Plan of Care (Signed)
  Problem: Acute Rehab OT Goals (only OT should resolve) Goal: Pt. Will Perform Grooming Flowsheets (Taken 07/22/2021 1427) Pt Will Perform Grooming:  with modified independence  sitting Goal: Pt. Will Perform Upper Body Dressing Flowsheets (Taken 07/22/2021 1427) Pt Will Perform Upper Body Dressing:  with modified independence  sitting Goal: Pt. Will Transfer To Toilet Flowsheets (Taken 07/22/2021 1427) Pt Will Transfer to Toilet:  with min guard assist  stand pivot transfer Goal: Pt. Will Perform Toileting-Clothing Manipulation Flowsheets (Taken 07/22/2021 1427) Pt Will Perform Toileting - Clothing Manipulation and hygiene:  with modified independence  sitting/lateral leans Goal: Pt/Caregiver Will Perform Home Exercise Program Flowsheets (Taken 07/22/2021 1427) Pt/caregiver will Perform Home Exercise Program:  Increased strength  Both right and left upper extremity  Independently  Peace Noyes OT, MOT

## 2021-07-22 NOTE — TOC Initial Note (Signed)
Transition of Care [facility]) - Initial/Assessment Note    Patient Details  Name: [name] MRN: [id number] Date of Birth: [birth year]  Transition of [facility] CM/SW Contact:    [name], LCSW Phone Number: 07/22/2021, 3:41 PM  Clinical Narrative:                 Patient from [facility]. Active with [facility]. Will return to [facility] at d/c. Considered Highrisk for readmission.   Expected Discharge Plan: Assisted Living Barriers to Discharge: Hospice Bed not available   Patient Goals and CMS Choice Patient states their goals for this hospitalization and ongoing recovery are:: return to [facility]      Expected Discharge Plan and Services Expected Discharge Plan: Assisted Living                                              Prior Living Arrangements/Services     Patient language and need for interpreter reviewed:: Yes Do you feel safe going back to the place where you live?: Yes      Need for Family Participation in Patient Care: Yes (Comment) Care giver support system in place?: Yes (comment)   Criminal Activity/Legal Involvement Pertinent to Current Situation/Hospitalization: No - Comment as needed  Activities of Daily Living Home Assistive Devices/Equipment: Wheelchair, Environmental consultant (specify type) ADL Screening (condition at time of admission) Patient's cognitive ability adequate to safely complete daily activities?: No Is the patient deaf or have difficulty hearing?: No Does the patient have difficulty seeing, even when wearing glasses/contacts?: No Does the patient have difficulty concentrating, remembering, or making decisions?: No Patient able to express need for assistance with ADLs?: Yes Does the patient have difficulty dressing or bathing?: Yes Independently performs ADLs?: No Communication: Independent Dressing (OT): Needs assistance Is this a change from baseline?: Pre-admission baseline Grooming: Needs assistance Is this a change from baseline?:  Pre-admission baseline Feeding: Independent Bathing: Needs assistance Is this a change from baseline?: Pre-admission baseline Toileting: Dependent Is this a change from baseline?: Pre-admission baseline In/Out Bed: Needs assistance Is this a change from baseline?: Pre-admission baseline Walks in Home: Dependent Is this a change from baseline?: Pre-admission baseline Does the patient have difficulty walking or climbing stairs?: Yes Weakness of Legs: Both Weakness of Arms/Hands: Left  Permission Sought/Granted                  Emotional Assessment         Alcohol / Substance Use: Not Applicable Psych Involvement: No (comment)  Admission diagnosis:  Intractable nausea and vomiting [R11.2] Nausea and vomiting, unspecified vomiting type [R11.2] Patient Active Problem List   Diagnosis Date Noted   Prolonged QT interval 07/22/2021   Chronic atrial fibrillation with RVR ([place]) 07/22/2021   Intractable nausea and vomiting 07/21/2021   Mood disorder ([place]) 05/18/2021   Multifocal pneumonia    Fecal impaction ([place]) 04/23/2021   Edema of right forearm    Acute respiratory failure with hypoxia ([place]) 03/22/2021   Lobar pneumonia ([place]) 03/22/2021   Sepsis due to undetermined organism ([place]) 03/22/2021   Paroxysmal atrial fibrillation ([place]) 03/22/2021   Nausea without vomiting 11/17/2020   Hyperthyroidism    Hypokalemia    SBO (small bowel obstruction) ([place]) 04/18/2020   Chest pain 02/21/2020   Hemorrhoids 11/26/2019   Atrial fibrillation with RVR ([place]) 10/04/2019   GERD (gastroesophageal reflux disease) 08/26/2019  Loss of weight 08/26/2019   Constipation 09/09/2018   Hypotension 03/21/2018   Diarrhea 07/20/2017   Primary osteoarthritis of right hip 04/18/2017   Orthostasis 03/28/2017   General weakness 03/28/2017   History of cerebrovascular accident (CVA) with residual deficit 03/28/2017   Schizophrenia ([place]) 03/28/2017   Generalized weakness 03/28/2017   Orthostatic  hypotension    IBS (irritable bowel syndrome) 10/16/2016   Left hemiparesis ([place]) 10/06/2016   Left-sided weakness 10/06/2016   Chronic superficial gastritis without bleeding    Stricture and stenosis of esophagus    Nausea with vomiting 04/20/2016   Abdominal pain 03/22/2016   Lesion of right lobe of liver 03/22/2016   Elevated troponin 02/13/2016   IDA (iron deficiency anemia) 04/06/2015   Mixed hyperlipidemia 11/06/2014   Obesity due to excess calories 11/06/2014   Sedentary lifestyle 11/06/2014   Rectal bleeding 10/21/2014   Type 2 diabetes mellitus ([place]) 03/24/2010   Essential hypertension 03/24/2010   DVT, lower extremity, recurrent ([place]) 03/24/2010   History of cardiovascular disorder 03/24/2010   PCP:  Carrolyn Meiers, MD Pharmacy:   [facility] [place] [address] [place] [address] Phone: [phone] Fax: [phone]     Social Determinants of Health (SDOH) Interventions    Readmission Risk Interventions    03/29/2021   10:51 AM 03/23/2021    2:15 PM  Readmission Risk Prevention Plan  Transportation Screening Complete Complete  HRI or Home Care Consult  Complete  Social Work Consult for [place] Planning/Counseling  Complete  Palliative Care Screening  Not Applicable  Medication Review Press photographer)  Complete  HRI or Home Care Consult Complete   SW Recovery Care/Counseling Consult Complete   Palliative Care Screening Not Applicable   Skilled Nursing Facility Complete

## 2021-07-22 NOTE — H&P (Signed)
History and Physical    Patient: [name]:[id number] DOB: 1945/08/22 DOA: 07/21/2021 DOS: the patient was seen and examined on 07/22/2021 PCP: Carrolyn Meiers, MD  Patient coming from: Home  Chief Complaint:  Chief Complaint  Patient presents with   Abdominal Pain   HPI: [name] is a 76 y.o. female with medical history significant of hypertension, paroxysmal atrial fibrillation, anxiety/depression, orthostatic hypotension, hyperlipidemia, T2DM, prior history of stroke, history of recurrent lower extremity DVT, GERD who presents to the emergency department due to 4-day onset of intractable nausea, vomiting and abdominal pain.  She states that she presented to the emergency department 3 days ago and abdominal x-ray done at that time was normal and she was discharged home.  She complained of epigastric pain with burning sensation radiation to the chest.  She denies fever, chills, shortness of breath.   ED Course:  In the emergency department, she was tachypneic, but other vital signs were within normal range.  Work-up in the ED showed normal CBC and BMP except for blood glucose at 121, lipase 22, troponin x2 was negative, urinalysis was normal. CT abdomen and pelvis with contrast showed 1. There is debris in the distal esophagus with moderate distention. This can be seen with gastroesophageal reflux. Distal esophageal obstructing process not excluded. Please correlate clinically. 2. Small hiatal hernia. 3. Large amount of stool in the rectosigmoid colon. 4. Cholelithiasis. 5.  Aortic Atherosclerosis Chest x-ray showed cardiomegaly with interim mild airspace disease at the peripheral left base, atelectasis versus pneumonia. Patient was treated with IV ceftriaxone.  Protonix, Pepcid, morphine and Zofran were given.  Gastroenterology (Dr. Abbey Chatters) was consulted and will see patient in the morning per ED physician.  Hospitalist was asked to admit patient for further evaluation  and management.   Review of Systems: Review of systems as noted in the HPI. All other systems reviewed and are negative.   Past Medical History:  Diagnosis Date   Anxiety    Arthritis    Atrial fibrillation (HCC)    Atrial flutter ([place])    Collagen vascular disease ([place])    COVID-19 virus infection    COVID-19+ approx 01/24/19; asymptomatic course with full recovery   Dependence on wheelchair    pivot/transfers   Depression    History of psychosis and previous suicide attempt   DVT, lower extremity, recurrent ([place])    Long-term Coumadin per Dr. Legrand Rams   Essential hypertension    GERD (gastroesophageal reflux disease)    Hemiplegia ([place]) 2010   Left side   History of stroke    Acute infarct and right cerebral white matter small vessel disease 12/10   Leg DVT (deep venous thromboembolism), acute ([place]) 2006   Orthostatic hypotension    Schizophrenia ([place])    Stroke ([place])    left sided weakness   Type 2 diabetes mellitus ([place])    Past Surgical History:  Procedure Laterality Date   BACK SURGERY     BIOPSY N/A 11/24/2014   Procedure: BIOPSY;  Surgeon: Danie Binder, MD;  Location: [facility];  Service: Endoscopy;  Laterality: N/A;   BIOPSY  05/17/2016   Procedure: BIOPSY;  Surgeon: Danie Binder, MD;  Location: [facility];  Service: Endoscopy;;  gastric biopsy   COLONOSCOPY WITH PROPOFOL N/A 11/24/2014   Dr. Rudie Meyer polyps removed/moderate sized internal hemorrhoids, tubular adenomas. Next surveillance in 3 years   ESOPHAGOGASTRODUODENOSCOPY (EGD) WITH PROPOFOL N/A 11/24/2014   Dr. Clayburn Pert HH/patent stricture at the gastroesophageal junction,  mild non-erosive gastritis, path negative for H.pylori or celiac sprue   ESOPHAGOGASTRODUODENOSCOPY (EGD) WITH PROPOFOL N/A 05/17/2016   Procedure: ESOPHAGOGASTRODUODENOSCOPY (EGD) WITH PROPOFOL;  Surgeon: Danie Binder, MD;  Location: [facility];  Service: Endoscopy;  Laterality: N/A;  12:45pm   GIVENS CAPSULE STUDY N/A 12/11/2014    MULTILPLE EROSION IN the stomach WITH ACTIVE OOZING. OCCASIONAL EROSIONS AND RARE ULCER SEEN IN PROXIMAL SMALL BOWEL . No masses or AVMs SEEN. NO OLD BLOOD OR FRESH BLOOD SEEN.    POLYPECTOMY N/A 11/24/2014   Procedure: POLYPECTOMY;  Surgeon: Danie Binder, MD;  Location: [facility];  Service: Endoscopy;  Laterality: N/A;   SAVORY DILATION N/A 05/17/2016   Procedure: SAVORY DILATION;  Surgeon: Danie Binder, MD;  Location: [facility];  Service: Endoscopy;  Laterality: N/A;    Social History:  reports that she quit smoking about 26 years ago. Her smoking use included cigarettes. She has a 5.00 pack-year smoking history. She has never used smokeless tobacco. She reports that she does not drink alcohol and does not use drugs.   Allergies  Allergen Reactions   Sulfa Antibiotics Rash    Family History  Problem Relation Age of Onset   Hypertension Mother    Colon cancer Neg Hx      Prior to Admission medications   Medication Sig Start Date End Date Taking? Authorizing Provider  ABILIFY 2 MG tablet Take 1 tablet (2 mg total) by mouth daily. Patient taking differently: Take 2 mg by mouth at bedtime. 11/09/20 07/21/21 Yes [name], MD  acetaminophen (TYLENOL) 325 MG tablet Take 650 mg by mouth 3 (three) times daily as needed for mild pain or moderate pain.    Yes [provider]  albuterol (VENTOLIN HFA) 108 (90 Base) MCG/ACT inhaler Inhale 2 puffs into the lungs every 6 (six) hours as needed for wheezing or shortness of breath. 05/20/21  Yes Barton Dubois, MD  amiodarone (PACERONE) 200 MG tablet Take 1 tablet (200 mg total) by mouth daily. 04/24/21  Yes Barton Dubois, MD  apixaban (ELIQUIS) 5 MG TABS tablet Take 1 tablet (5 mg total) by mouth 2 (two) times daily. 02/21/20  Yes Richarda Osmond, MD  atorvastatin (LIPITOR) 40 MG tablet Take 1 tablet by mouth at bedtime. 05/30/20  Yes [provider]  busPIRone (BUSPAR) 7.5 MG tablet Take 7.5 mg by mouth 2 (two) times daily.  07/07/21  Yes [provider]  calcium carbonate (TUMS - DOSED IN MG ELEMENTAL CALCIUM) 500 MG chewable tablet Chew 1 tablet by mouth 4 (four) times daily as needed for heartburn.   Yes [provider]  Carboxymethylcellulose Sodium (ARTIFICIAL TEARS OP) Place 1 drop into both eyes 3 (three) times daily.   Yes [provider]  Cholecalciferol (VITAMIN D3) 50 MCG (2000 UT) TABS Take 2,000 Units by mouth daily.   Yes [provider]  clonazePAM (KLONOPIN) 0.5 MG tablet Take 1 tablet (0.5 mg total) by mouth 3 (three) times daily as needed for anxiety. 05/20/21  Yes Barton Dubois, MD  diltiazem (CARDIZEM CD) 120 MG 24 hr capsule Take 120 mg by mouth daily. 05/30/20  Yes [provider]  DULoxetine (CYMBALTA) 60 MG capsule Take 120 mg by mouth daily.   Yes [provider]  gabapentin (NEURONTIN) 300 MG capsule Take 1 capsule (300 mg total) by mouth every 8 (eight) hours as needed (pain). 05/20/21  Yes Barton Dubois, MD  HYDROcodone-acetaminophen [facility]) 10-325 MG tablet Take 1 tablet by mouth  every 8 (eight) hours as needed for severe pain. 05/20/21  Yes Barton Dubois, MD  insulin detemir (LEVEMIR FLEXTOUCH) 100 UNIT/ML FlexPen INJECT 22 UNITS SUBCUTANEOUSLY AT BEDTIME.(HOLD IF BS<70: CALL MD IF BS> 400) 07/15/21  Yes Reardon, Juanetta Beets, NP  latanoprost (XALATAN) 0.005 % ophthalmic solution Place 1 drop into both eyes at bedtime.   Yes [provider]  loratadine (CLARITIN) 10 MG tablet Take 10 mg by mouth daily.   Yes [provider]  metoCLOPramide (REGLAN) 5 MG tablet Take 5 mg by mouth 3 (three) times daily. If Zofran is not working 06/27/21  Yes [provider]  metoprolol succinate (TOPROL-XL) 50 MG 24 hr tablet Take 1 tablet (50 mg total) by mouth daily. Take with or immediately following a meal. 04/01/21  Yes Tat, Shanon Brow, MD  midodrine (PROAMATINE) 10 MG tablet Take 10 mg by mouth 3 (three) times daily.   Yes [provider]  mirtazapine (REMERON) 30 MG tablet Take 30 mg by mouth at bedtime.   Yes [provider]  naloxegol oxalate (MOVANTIK) 12.5 MG TABS tablet Take 1 tablet (12.5 mg total) by mouth daily. 04/24/21  Yes Barton Dubois, MD  NOVOLOG FLEXPEN 100 UNIT/ML FlexPen INJECT SUBCUTANEOUSLY AS FOLLOWS WITH MEALS: 90-150=10u: 151-200=11u: 201-250=12u: 251-300=13u: 301-350=14u: 351-400=15u: BS>400=16u & CALL MD. Patient taking differently: 10-16 Units 3 (three) times daily with meals. INJECT SUBCUTANEOUSLY AS FOLLOWS WITH MEALS: 90-150=10u: 151-200=11u: 201-250=12u: 251-300=13u: 301-350=14u: 351-400=15u: BS>400=16u & CALL MD. 02/28/21  Yes Brita Romp, NP  ondansetron (ZOFRAN) 4 MG tablet Take by mouth 2 (two) times daily. 06/24/21  Yes [provider]  oxybutynin (DITROPAN-XL) 5 MG 24 hr tablet Take 5 mg by mouth daily. 04/20/21  Yes [provider]  pantoprazole (PROTONIX) 40 MG tablet Take 1 tablet (40 mg total) by mouth 2 (two) times daily before a meal. 11/17/20  Yes Mahala Menghini, PA-C  polyethylene glycol (MIRALAX / GLYCOLAX) 17 g packet Take 17 g by mouth daily. Patient taking differently: Take 17 g by mouth daily as needed for mild constipation. 04/24/21  Yes Barton Dubois, MD  risperiDONE (RISPERDAL) 1 MG tablet Take 1-2 mg by mouth 2 (two) times daily. Take 1 tablet by mouth in the morning. Take 2 tablets by mouth at bedtime. 04/04/21  Yes [provider]  SANTYL 250 UNIT/GM ointment Apply 1 Application topically daily. 07/05/21  Yes [provider]  senna (SENOKOT) 8.6 MG tablet Take 1 tablet by mouth daily.   Yes [provider]  TRADJENTA 5 MG TABS tablet TAKE (1) TABLET BY MOUTH ONCE DAILY. Patient taking differently: Take 5 mg by mouth daily. 04/04/21  Yes Reardon, Juanetta Beets, NP  bisacodyl (DULCOLAX) 5 MG EC tablet Take 1 tablet (5 mg total) by mouth daily as needed for moderate constipation or mild constipation. Patient not taking:  Reported on 07/21/2021 04/24/21 04/24/22  Barton Dubois, MD  [facility] TEST test strip USE TO CHECK BLOOD SUGAR FOUR TIMES DAILY.(HOLD IF BS<70: CALL MD IF BS> 400) 07/21/21   Brita Romp, NP  GOODSENSE ARTIFICIAL TEARS 0.5-0.6 % SOLN Apply to eye. 06/03/21   [provider]    Physical Exam: BP (!) 139/98 (BP Location: Left Arm)   Pulse 69   Temp 98.4 F (36.9 C) (Oral)   Resp 18   Ht '5\' 7"'$  (1.702 m)   Wt 58.7 kg   SpO2 100%   BMI 20.27 kg/m   General: 76 y.o. year-old female well developed well nourished  in no acute distress.  Alert and oriented x3. HEENT: NCAT, EOMI Neck: Supple, trachea medial Cardiovascular: Regular rate and rhythm with no rubs or gallops.  No thyromegaly or JVD noted.  No lower extremity edema. 2/4 pulses in all 4 extremities. Respiratory: Clear to auscultation with no wheezes or rales. Good inspiratory effort. Abdomen: Soft, tender palpation of epigastric area without guarding. Normal bowel sounds x4 quadrants. Muskuloskeletal: No cyanosis, clubbing or edema noted bilaterally Neuro: CN II-XII intact, strength 5/5 x 4, sensation, reflexes intact Skin: No ulcerative lesions noted or rashes Psychiatry: Judgement and insight appear normal. Mood is appropriate for condition and setting          Labs on Admission:  Basic Metabolic Panel: Recent Labs  Lab 07/18/21 1502 07/21/21 2013 07/22/21 0530  NA 135 137 138  K 3.9 4.1 4.2  CL 97* 99 102  CO2 32 30 31  GLUCOSE 153* 121* 162*  BUN '15 15 12  '$ CREATININE 0.79 0.77 0.77  CALCIUM 8.9 9.0 8.9  MG  --   --  2.1  PHOS  --   --  3.5    Liver Function Tests: Recent Labs  Lab 07/18/21 1502 07/21/21 2013 07/22/21 0530  AST 12* 17 12*  ALT '11 13 11  '$ ALKPHOS 76 75 64  BILITOT 0.5 0.5 0.4  PROT 7.7 8.0 6.9  ALBUMIN 3.8 4.1 3.4*    Recent Labs  Lab 07/18/21 1502 07/21/21 2013  LIPASE 22 22    No results for input(s): "AMMONIA" in the last 168 hours. CBC: Recent Labs  Lab  07/18/21 1502 07/21/21 2013 07/22/21 0530  WBC 7.0 8.0 7.3  NEUTROABS 4.9  --   --   HGB 12.4 12.7 11.7*  HCT 38.6 39.3 35.8*  MCV 87.9 87.1 86.9  PLT 280 278 237    Cardiac Enzymes: No results for input(s): "CKTOTAL", "CKMB", "CKMBINDEX", "TROPONINI" in the last 168 hours.  BNP (last 3 results) Recent Labs    03/22/21 0938 03/27/21 0357 05/18/21 0923  BNP 365.0* 276.0* 348.0*     ProBNP (last 3 results) No results for input(s): "PROBNP" in the last 8760 hours.  CBG: No results for input(s): "GLUCAP" in the last 168 hours.  Radiological Exams on Admission: CT ABDOMEN PELVIS W CONTRAST  Result Date: 07/21/2021 CLINICAL DATA:  Epigastric pain. EXAM: CT ABDOMEN AND PELVIS WITH CONTRAST TECHNIQUE: Multidetector CT imaging of the abdomen and pelvis was performed using the standard protocol following bolus administration of intravenous contrast. RADIATION DOSE REDUCTION: This exam was performed according to the departmental dose-optimization program which includes automated exposure control, adjustment of the mA and/or kV according to patient size and/or use of iterative reconstruction technique. CONTRAST:  127m OMNIPAQUE IOHEXOL 300 MG/ML  SOLN COMPARISON:  CT abdomen and pelvis 04/23/2021 FINDINGS: Lower chest: There is a calcified granuloma in the right lower lobe, unchanged. No acute findings. Hepatobiliary: Hepatic hemangioma measuring up to 3.3 cm appears unchanged. No new liver lesions. Gallstones are again noted. There is no bile duct dilatation. Pancreas: Unremarkable. No pancreatic ductal dilatation or surrounding inflammatory changes. Spleen: Normal in size without focal abnormality. Adrenals/Urinary Tract: Adrenal glands are unremarkable. Kidneys are normal, without renal calculi, focal lesion, or hydronephrosis. Bladder is unremarkable. Stomach/Bowel: Appendix appears normal. No evidence of bowel wall thickening, distention, or inflammatory changes. There is a large amount  of stool in the rectosigmoid colon. There is a small hiatal hernia. The remaining stomach is within normal limits. There is distention and debris in  the distal esophagus. Vascular/Lymphatic: Aortic atherosclerosis. No enlarged abdominal or pelvic lymph nodes. Reproductive: Uterus and bilateral adnexa are unremarkable. Other: No abdominal wall hernia or abnormality. No abdominopelvic ascites. Musculoskeletal: Degenerative changes affect the spine. There is stable grade 1 anterolisthesis at L4-L5. IMPRESSION: 1. There is debris in the distal esophagus with moderate distention. This can be seen with gastroesophageal reflux. Distal esophageal obstructing process not excluded. Please correlate clinically. 2. Small hiatal hernia. 3. Large amount of stool in the rectosigmoid colon. 4. Cholelithiasis. 5.  Aortic Atherosclerosis (ICD10-I70.0). Electronically Signed   By: Ronney Asters M.D.   On: 07/21/2021 22:39   DG Chest 2 View  Result Date: 07/21/2021 CLINICAL DATA:  Chest pain EXAM: CHEST - 2 VIEW COMPARISON:  07/18/2021, 07/07/2021 FINDINGS: Cardiomegaly. Increased opacity at the left lung base. Aortic atherosclerosis. No pneumothorax IMPRESSION: 1. Cardiomegaly with interim mild airspace disease at the peripheral left base, atelectasis versus pneumonia Electronically Signed   By: Donavan Foil M.D.   On: 07/21/2021 21:18    EKG: I independently viewed the EKG done and my findings are as followed: A-fib with rate control and QTc 507 ms  Assessment/Plan Present on Admission:  Intractable nausea and vomiting  DVT, lower extremity, recurrent (HCC)  Mixed hyperlipidemia  Principal Problem:   Intractable nausea and vomiting Active Problems:   Type 2 diabetes mellitus ([place])   Essential hypertension   DVT, lower extremity, recurrent (HCC)   Mixed hyperlipidemia   Abdominal pain   Stricture and stenosis of esophagus   Prolonged QT interval   Chronic atrial fibrillation with RVR (HCC)  Abdominal pain,  intractable nausea and vomiting Continue IV NS at 75 mLs/Hr Continue IV Protonix Continue IV morphine 2 mg q.4h p.r.n. for moderate to severe pain Continue IV Compazine p.r.n. Continue n.p.o. Gastroenterology will be consulted to follow up with patient in the morning  Possible esophageal obstruction CT abdomen and pelvis was suggestive of distal esophageal obstructing process  Gastroenterology will be consulted  GERD Continue Protonix  Prolonged QTc QTc 507 ms Avoid QT prolonging drugs Magnesium level will be checked Continue telemetry  Chronic atrial fibrillation Rate currently controlled Continue amiodarone, Cardizem Resume home Eliquis after anticipated GI procedure  History of CVA Continue Lipitor; resume Eliquis after anticipated GI procedure  Lower extremity DVT Resume home Eliquis after anticipated GI procedure  Essential hypertension Continue home meds  Mixed hyperlipidemia Continue Lipitor  Type 2 diabetes mellitus ISS and hypoglycemic protocol   DVT prophylaxis: SCDs (resume home Eliquis after anticipated GI procedure)  Code Status: Full code   Consults: Gastroenterology  Family Communication: None at bedside  Severity of Illness: The appropriate patient status for this patient is INPATIENT. Inpatient status is judged to be reasonable and necessary in order to provide the required intensity of service to ensure the patient's safety. The patient's presenting symptoms, physical exam findings, and initial radiographic and laboratory data in the context of their chronic comorbidities is felt to place them at high risk for further clinical deterioration. Furthermore, it is not anticipated that the patient will be medically stable for discharge from the hospital within 2 midnights of admission.   * I certify that at the point of admission it is my clinical judgment that the patient will require inpatient hospital care spanning beyond 2 midnights from the  point of admission due to high intensity of service, high risk for further deterioration and high frequency of surveillance required.*  Author: Bernadette Hoit, DO 07/22/2021 7:32 AM  For on  call review [email or web address]

## 2021-07-22 NOTE — Consult Note (Signed)
Gastroenterology Consult   Referring Provider: No ref. provider found Primary Care Physician:  Carrolyn Meiers, MD Primary Gastroenterologist:  Dr. Abbey Chatters  Patient ID: [name]; [id number]; [birth year]   Admit date: 07/21/2021  LOS: 1 day   Date of Consultation: 07/22/2021  Reason for Consultation:  Intractable nausea vomiting, rule out esophageal obstruction  History of Present Illness   [name] is a 76 y.o. year old female with history of HTN, A-fib, anxiety/depression, orthostatic hypotension, HLD, type 2 diabetes, prior stroke, history of recurrent DVTs, GERD, and chronic constipation who presented to the ED with 4 days of intractable nausea, vomiting, abdominal pain.  She was previously seen in the ED 3 days ago with abdominal x-ray that was normal was discharged home.  GI consulted due to imaging concerns of possible gastroesophageal obstruction and esophageal debris.  ED course: Noted to be tachypneic but all other vital signs stable. Labs with normal CBC and BMP with the exception of elevated glucose at 121, lipase 22, troponins were negative and UA was normal CT A/P with contrast showing debris in the distal esophagus with moderate distention which could be GERD or distal esophageal obstructing process, small hiatal hernia, large amount of stool in the rectosigmoid colon. She was given IV ceftriaxone, Protonix, Pepcid, morphine, Zofran in the ED  Consult: Last seen in our office at [facility] in October 2022.  She had complaint of daily nausea without vomiting, was on pantoprazole once daily.  Staff stated they had a hard time getting her to eat, patient states she did not eat snacks between meals, would drink Ensure once or twice daily.  She had very little abdominal pain but denied any melena or BRBPR.  Reported daily bowel movements that were loose and had recently been started on insulin which she felt was contributing to her nausea.  Per this office note she  weighed around 127 pounds that was stable for her.  She was offered GES, upper GI series, or upper endoscopy but declined.  She was advised to stop Amitiza and try Trulance 3 mg daily and increase pantoprazole to twice daily.    Patient states her nausea has improved, she has not had any vomiting today.  She continues to have upper abdominal pain with burning sensation up into her chest.  She also complains of frequent belching as of late, denies dysphagia.  States that she has not had much of an appetite at her assisted living facility.  Patient states she feels like she has lost weight, documented weight today 129 pounds, her last office visit in October 2022 she was 127 pounds.  Patient declines any over-the-counter NSAID use, usually takes Tylenol for pain.  She states that she does take a blood thinner due to her stroke she had many years ago however she does not remember when her last dose was.  She also complains of constipation, stating that her stools have been hard even though she is going daily.  She states that she has had some intermittent issues with her hemorrhoids with intermittent toilet tissue hematochezia. Patient states her mouth is dry and would like to try some sips of liquids.   Called [facility] and spoke with Tammy, Mudlogger and confirmed that her most recent dose of Eliquis was 8 am 6/29 and she is currently on pantoprazole 40 mg BID. Reported emesis was clear in nature.  Patient does have a legal guardian, her name is [name].   Last EGD 05/17/2016: Benign-appearing esophageal  stenosis, small hiatal hernia, few gastric polyps, chronic gastritis, nonbleeding duodenal diverticulum.   Last colonoscopy November 2016: three sessile polyps ranging 6 to 8 mm found in the descending, transverse, and ascending colon, moderate internal hemorrhoids found.   Past Medical History:  Diagnosis Date   Anxiety    Arthritis    Atrial fibrillation (HCC)    Atrial flutter ([place])     Collagen vascular disease ([place])    COVID-19 virus infection    COVID-19+ approx 01/24/19; asymptomatic course with full recovery   Dependence on wheelchair    pivot/transfers   Depression    History of psychosis and previous suicide attempt   DVT, lower extremity, recurrent ([place])    Long-term Coumadin per Dr. Legrand Rams   Essential hypertension    GERD (gastroesophageal reflux disease)    Hemiplegia ([place]) 2010   Left side   History of stroke    Acute infarct and right cerebral white matter small vessel disease 12/10   Leg DVT (deep venous thromboembolism), acute ([place]) 2006   Orthostatic hypotension    Schizophrenia ([place])    Stroke ([place])    left sided weakness   Type 2 diabetes mellitus ([place])     Past Surgical History:  Procedure Laterality Date   BACK SURGERY     BIOPSY N/A 11/24/2014   Procedure: BIOPSY;  Surgeon: Danie Binder, MD;  Location: [facility];  Service: Endoscopy;  Laterality: N/A;   BIOPSY  05/17/2016   Procedure: BIOPSY;  Surgeon: Danie Binder, MD;  Location: [facility];  Service: Endoscopy;;  gastric biopsy   COLONOSCOPY WITH PROPOFOL N/A 11/24/2014   Dr. Rudie Meyer polyps removed/moderate sized internal hemorrhoids, tubular adenomas. Next surveillance in 3 years   ESOPHAGOGASTRODUODENOSCOPY (EGD) WITH PROPOFOL N/A 11/24/2014   Dr. Clayburn Pert HH/patent stricture at the gastroesophageal junction, mild non-erosive gastritis, path negative for H.pylori or celiac sprue   ESOPHAGOGASTRODUODENOSCOPY (EGD) WITH PROPOFOL N/A 05/17/2016   Procedure: ESOPHAGOGASTRODUODENOSCOPY (EGD) WITH PROPOFOL;  Surgeon: Danie Binder, MD;  Location: [facility];  Service: Endoscopy;  Laterality: N/A;  12:45pm   GIVENS CAPSULE STUDY N/A 12/11/2014   MULTILPLE EROSION IN the stomach WITH ACTIVE OOZING. OCCASIONAL EROSIONS AND RARE ULCER SEEN IN PROXIMAL SMALL BOWEL . No masses or AVMs SEEN. NO OLD BLOOD OR FRESH BLOOD SEEN.    POLYPECTOMY N/A 11/24/2014   Procedure: POLYPECTOMY;  Surgeon: Danie Binder, MD;  Location: [facility];  Service: Endoscopy;  Laterality: N/A;   SAVORY DILATION N/A 05/17/2016   Procedure: SAVORY DILATION;  Surgeon: Danie Binder, MD;  Location[facility];  Service: Endoscopy;  Laterality: N/A;    Prior to Admission medications   Medication Sig Start Date End Date Taking? Authorizing Provider  ABILIFY 2 MG tablet Take 1 tablet (2 mg total) by mouth daily. Patient taking differently: Take 2 mg by mouth at bedtime. 11/09/20 07/21/21 Yes [name], MD  acetaminophen (TYLENOL) 325 MG tablet Take 650 mg by mouth 3 (three) times daily as needed for mild pain or moderate pain.    Yes [provider]  albuterol (VENTOLIN HFA) 108 (90 Base) MCG/ACT inhaler Inhale 2 puffs into the lungs every 6 (six) hours as needed for wheezing or shortness of breath. 05/20/21  Yes Barton Dubois, MD  amiodarone (PACERONE) 200 MG tablet Take 1 tablet (200 mg total) by mouth daily. 04/24/21  Yes Barton Dubois, MD  apixaban (ELIQUIS) 5 MG TABS tablet Take 1 tablet (5 mg total) by mouth 2 (  two) times daily. 02/21/20  Yes Richarda Osmond, MD  atorvastatin (LIPITOR) 40 MG tablet Take 1 tablet by mouth at bedtime. 05/30/20  Yes [provider]  busPIRone (BUSPAR) 7.5 MG tablet Take 7.5 mg by mouth 2 (two) times daily. 07/07/21  Yes [provider]  calcium carbonate (TUMS - DOSED IN MG ELEMENTAL CALCIUM) 500 MG chewable tablet Chew 1 tablet by mouth 4 (four) times daily as needed for heartburn.   Yes [provider]  Carboxymethylcellulose Sodium (ARTIFICIAL TEARS OP) Place 1 drop into both eyes 3 (three) times daily.   Yes [provider]  Cholecalciferol (VITAMIN D3) 50 MCG (2000 UT) TABS Take 2,000 Units by mouth daily.   Yes [provider]  clonazePAM (KLONOPIN) 0.5 MG tablet Take 1 tablet (0.5 mg total) by mouth 3 (three) times daily as needed for anxiety. 05/20/21  Yes Barton Dubois, MD  diltiazem (CARDIZEM CD) 120 MG 24 hr capsule  Take 120 mg by mouth daily. 05/30/20  Yes [provider]  DULoxetine (CYMBALTA) 60 MG capsule Take 120 mg by mouth daily.   Yes [provider]  gabapentin (NEURONTIN) 300 MG capsule Take 1 capsule (300 mg total) by mouth every 8 (eight) hours as needed (pain). 05/20/21  Yes Barton Dubois, MD  HYDROcodone-acetaminophen [facility]) 10-325 MG tablet Take 1 tablet by mouth every 8 (eight) hours as needed for severe pain. 05/20/21  Yes Barton Dubois, MD  insulin detemir (LEVEMIR FLEXTOUCH) 100 UNIT/ML FlexPen INJECT 22 UNITS SUBCUTANEOUSLY AT BEDTIME.(HOLD IF BS<70: CALL MD IF BS> 400) 07/15/21  Yes Reardon, Juanetta Beets, NP  latanoprost (XALATAN) 0.005 % ophthalmic solution Place 1 drop into both eyes at bedtime.   Yes [provider]  loratadine (CLARITIN) 10 MG tablet Take 10 mg by mouth daily.   Yes [provider]  metoCLOPramide (REGLAN) 5 MG tablet Take 5 mg by mouth 3 (three) times daily. If Zofran is not working 06/27/21  Yes [provider]  metoprolol succinate (TOPROL-XL) 50 MG 24 hr tablet Take 1 tablet (50 mg total) by mouth daily. Take with or immediately following a meal. 04/01/21  Yes Tat, Shanon Brow, MD  midodrine (PROAMATINE) 10 MG tablet Take 10 mg by mouth 3 (three) times daily.   Yes [provider]  mirtazapine (REMERON) 30 MG tablet Take 30 mg by mouth at bedtime.   Yes [provider]  naloxegol oxalate (MOVANTIK) 12.5 MG TABS tablet Take 1 tablet (12.5 mg total) by mouth daily. 04/24/21  Yes Barton Dubois, MD  NOVOLOG FLEXPEN 100 UNIT/ML FlexPen INJECT SUBCUTANEOUSLY AS FOLLOWS WITH MEALS: 90-150=10u: 151-200=11u: 201-250=12u: 251-300=13u: 301-350=14u: 351-400=15u: BS>400=16u & CALL MD. Patient taking differently: 10-16 Units 3 (three) times daily with meals. INJECT SUBCUTANEOUSLY AS FOLLOWS WITH MEALS: 90-150=10u: 151-200=11u: 201-250=12u: 251-300=13u: 301-350=14u: 351-400=15u: BS>400=16u & CALL MD. 02/28/21  Yes Brita Romp, NP   ondansetron (ZOFRAN) 4 MG tablet Take by mouth 2 (two) times daily. 06/24/21  Yes [provider]  oxybutynin (DITROPAN-XL) 5 MG 24 hr tablet Take 5 mg by mouth daily. 04/20/21  Yes [provider]  pantoprazole (PROTONIX) 40 MG tablet Take 1 tablet (40 mg total) by mouth 2 (two) times daily before a meal. 11/17/20  Yes Mahala Menghini, PA-C  polyethylene glycol (MIRALAX / GLYCOLAX) 17 g packet Take 17 g by mouth daily. Patient taking differently: Take 17 g by mouth daily as needed for mild constipation. 04/24/21  Yes Barton Dubois, MD  risperiDONE (RISPERDAL) 1 MG tablet Take  1-2 mg by mouth 2 (two) times daily. Take 1 tablet by mouth in the morning. Take 2 tablets by mouth at bedtime. 04/04/21  Yes [provider]  SANTYL 250 UNIT/GM ointment Apply 1 Application topically daily. 07/05/21  Yes [provider]  senna (SENOKOT) 8.6 MG tablet Take 1 tablet by mouth daily.   Yes [provider]  TRADJENTA 5 MG TABS tablet TAKE (1) TABLET BY MOUTH ONCE DAILY. Patient taking differently: Take 5 mg by mouth daily. 04/04/21  Yes Reardon, Juanetta Beets, NP  bisacodyl (DULCOLAX) 5 MG EC tablet Take 1 tablet (5 mg total) by mouth daily as needed for moderate constipation or mild constipation. Patient not taking: Reported on 07/21/2021 04/24/21 04/24/22  Barton Dubois, MD  [facility] TEST test strip USE TO CHECK BLOOD SUGAR FOUR TIMES DAILY.(HOLD IF BS<70: CALL MD IF BS> 400) 07/21/21   Brita Romp, NP  GOODSENSE ARTIFICIAL TEARS 0.5-0.6 % SOLN Apply to eye. 06/03/21   [provider]    Current Facility-Administered Medications  Medication Dose Route Frequency Provider Last Rate Last Admin   0.9 %  sodium chloride infusion   Intravenous Continuous Adefeso, Oladapo, DO 75 mL/hr at 07/22/21 0845 New Bag at 07/22/21 0845   insulin aspart (novoLOG) injection 0-9 Units  0-9 Units Subcutaneous Q4H Adefeso, Oladapo, DO   2 Units at 07/22/21 0845   morphine (PF) 2 MG/ML  injection 2 mg  2 mg Intravenous Q4H PRN Adefeso, Oladapo, DO   2 mg at 07/22/21 1638   pantoprazole (PROTONIX) injection 40 mg  40 mg Intravenous Q12H Adefeso, Oladapo, DO   40 mg at 07/22/21 0845   prochlorperazine (COMPAZINE) injection 10 mg  10 mg Intravenous Q6H PRN Adefeso, Oladapo, DO        Allergies as of 07/21/2021 - Review Complete 07/21/2021  Allergen Reaction Noted   Sulfa antibiotics Rash 02/23/2010    Family History  Problem Relation Age of Onset   Hypertension Mother    Colon cancer Neg Hx     Social History   Socioeconomic History   Marital status: Widowed    Spouse name: Not on file   Number of children: Not on file   Years of education: Not on file   Highest education level: Not on file  Occupational History   Not on file  Tobacco Use   Smoking status: Former    Packs/day: 0.25    Years: 20.00    Total pack years: 5.00    Types: Cigarettes    Quit date: 01/24/1995    Years since quitting: 26.5   Smokeless tobacco: Never  Vaping Use   Vaping Use: Never used  Substance and Sexual Activity   Alcohol use: No    Alcohol/week: 0.0 standard drinks of alcohol   Drug use: No   Sexual activity: Never    Birth control/protection: Post-menopausal  Other Topics Concern   Not on file  Social History Narrative   Not on file   Social Determinants of Health   Financial Resource Strain: Not on file  Food Insecurity: Not on file  Transportation Needs: Not on file  Physical Activity: Not on file  Stress: Not on file  Social Connections: Not on file  Intimate Partner Violence: Not on file     Review of Systems   Gen: Denies any fever, chills, loss of appetite, change in weight or weight loss CV: Denies chest pain, heart palpitations, syncope, edema  Resp: Denies shortness of breath with rest,  cough, wheezing, coughing up blood, and pleurisy. GI: see HPI GU : Denies urinary burning, blood in urine, urinary frequency, and urinary incontinence. MS: Denies  joint pain, limitation of movement, swelling, cramps, and atrophy.  Derm: Denies rash, itching, dry skin, hives. Psych: Denies depression, anxiety, memory loss, hallucinations, and confusion. Heme: Denies bruising or bleeding Neuro:  Denies any headaches, dizziness, paresthesias, shaking  Physical Exam   Vital Signs in last 24 hours: Temp:  [98.2 F (36.8 C)-98.4 F (36.9 C)] 98.4 F (36.9 C) (06/30 0507) Pulse Rate:  [69-113] 69 (06/30 0507) Resp:  [18-24] 18 (06/30 0507) BP: (110-139)/(56-98) 139/98 (06/30 0507) SpO2:  [95 %-100 %] 100 % (06/30 0507) Weight:  [58.7 kg-60.8 kg] 58.7 kg (06/30 0058) Last BM Date : 07/20/21  General:   Alert,  Well-developed, well-nourished, pleasant and cooperative in NAD Head:  Normocephalic and atraumatic. Eyes:  Sclera clear, no icterus.   Conjunctiva pink. Ears:  Normal auditory acuity. Mouth:  missing teeth, dentures at facility, dry mucous membranes Lungs:  Clear throughout to auscultation.   No wheezes, crackles, or rhonchi. No acute distress. Heart:  Regular rate and rhythm; no murmurs, clicks, rubs,  or gallops. Abdomen:  Soft, non-distended. No masses, hepatosplenomegaly or hernias noted. Normal bowel sounds, without guarding, and without rebound.   Rectal:  deferred. Extremities:  Without clubbing or edema. Neurologic:  Alert and oriented x4. Slightly forgetful.  Skin:  Intact without significant lesions or rashes. Psych:  Alert and cooperative. Normal mood and affect.  Intake/Output from previous day: No intake/output data recorded. Intake/Output this shift: No intake/output data recorded.   Labs/Studies   Recent Labs Recent Labs    07/21/21 2013 07/22/21 0530  WBC 8.0 7.3  HGB 12.7 11.7*  HCT 39.3 35.8*  PLT 278 237   BMET Recent Labs    07/21/21 2013 07/22/21 0530  NA 137 138  K 4.1 4.2  CL 99 102  CO2 30 31  GLUCOSE 121* 162*  BUN 15 12  CREATININE 0.77 0.77  CALCIUM 9.0 8.9   LFT Recent Labs     07/21/21 2013 07/22/21 0530  PROT 8.0 6.9  ALBUMIN 4.1 3.4*  AST 17 12*  ALT 13 11  ALKPHOS 75 64  BILITOT 0.5 0.4   PT/INR No results for input(s): "LABPROT", "INR" in the last 72 hours. Hepatitis Panel No results for input(s): "HEPBSAG", "HCVAB", "HEPAIGM", "HEPBIGM" in the last 72 hours. C-Diff No results for input(s): "CDIFFTOX" in the last 72 hours.  Radiology/Studies CT ABDOMEN PELVIS W CONTRAST  Result Date: 07/21/2021 CLINICAL DATA:  Epigastric pain. EXAM: CT ABDOMEN AND PELVIS WITH CONTRAST TECHNIQUE: Multidetector CT imaging of the abdomen and pelvis was performed using the standard protocol following bolus administration of intravenous contrast. RADIATION DOSE REDUCTION: This exam was performed according to the departmental dose-optimization program which includes automated exposure control, adjustment of the mA and/or kV according to patient size and/or use of iterative reconstruction technique. CONTRAST:  160m OMNIPAQUE IOHEXOL 300 MG/ML  SOLN COMPARISON:  CT abdomen and pelvis 04/23/2021 FINDINGS: Lower chest: There is a calcified granuloma in the right lower lobe, unchanged. No acute findings. Hepatobiliary: Hepatic hemangioma measuring up to 3.3 cm appears unchanged. No new liver lesions. Gallstones are again noted. There is no bile duct dilatation. Pancreas: Unremarkable. No pancreatic ductal dilatation or surrounding inflammatory changes. Spleen: Normal in size without focal abnormality. Adrenals/Urinary Tract: Adrenal glands are unremarkable. Kidneys are normal, without renal calculi, focal lesion, or hydronephrosis. Bladder is unremarkable. Stomach/Bowel:  Appendix appears normal. No evidence of bowel wall thickening, distention, or inflammatory changes. There is a large amount of stool in the rectosigmoid colon. There is a small hiatal hernia. The remaining stomach is within normal limits. There is distention and debris in the distal esophagus. Vascular/Lymphatic: Aortic  atherosclerosis. No enlarged abdominal or pelvic lymph nodes. Reproductive: Uterus and bilateral adnexa are unremarkable. Other: No abdominal wall hernia or abnormality. No abdominopelvic ascites. Musculoskeletal: Degenerative changes affect the spine. There is stable grade 1 anterolisthesis at L4-L5. IMPRESSION: 1. There is debris in the distal esophagus with moderate distention. This can be seen with gastroesophageal reflux. Distal esophageal obstructing process not excluded. Please correlate clinically. 2. Small hiatal hernia. 3. Large amount of stool in the rectosigmoid colon. 4. Cholelithiasis. 5.  Aortic Atherosclerosis (ICD10-I70.0). Electronically Signed   By: Ronney Asters M.D.   On: 07/21/2021 22:39   DG Chest 2 View  Result Date: 07/21/2021 CLINICAL DATA:  Chest pain EXAM: CHEST - 2 VIEW COMPARISON:  07/18/2021, 07/07/2021 FINDINGS: Cardiomegaly. Increased opacity at the left lung base. Aortic atherosclerosis. No pneumothorax IMPRESSION: 1. Cardiomegaly with interim mild airspace disease at the peripheral left base, atelectasis versus pneumonia Electronically Signed   By: Donavan Foil M.D.   On: 07/21/2021 21:18     Assessment   SHAILI DONALSON is a 76 y.o. year old female with history of HTN, A-fib, anxiety/depression, orthostatic hypotension, HLD, type 2 diabetes, prior stroke, history of recurrent DVTs, GERD, and chronic constipation who presented to the ED with 4 days of intractable nausea, vomiting, abdominal pain.  She was previously seen in the ED 3 days ago with abdominal x-ray that was normal was discharged home.  GI consulted due to imaging concerns of possible gastroesophageal obstruction and esophageal debris.   GERD/intractable nausea,vomiting,abdominal pain: Nausea has been chronic for patient, with intermittent vomiting.  She was previously seen outpatient for evaluation of this and declined upper endoscopy, upper GI series, or gastric emptying study.  She has been on PPI twice  daily for reflux outpatient that was recently increased in October 2022.  There was previously concerns for weight loss due to lack of appetite and nausea at her facility, however weight appears to have been stable and per chart review she currently weighs 129 pounds, was previously 127 pounds in October.  She has been using Zofran as needed for nausea.  Prior to her last outpatient visit she was started on insulin for her diabetes which likely could be contributing to her symptoms.  She denies any dysphagia but has been experiencing frequent belching as well as burning epigastric pain radiating into her chest.  Spoke to director at [facility] who confirmed that she has been on PPI twice daily, has not had any regular NSAIDs, and her emesis has been clear in nature.  Patient herself denies any issues with dysphagia.  Last EGD in 2018 noting esophageal stenosis. Given CT A/P findings with esophageal debris and esophageal distention possibly representing reflux or esophageal obstruction would recommend pursuing upper endoscopy with possible dilation for further evaluation.  Differentials include worsening reflux, gastritis, peptic ulcer disease, gastritis, esophageal stenosis, esophageal ring or webbing.  Patient is chronically anticoagulated with Eliquis and her last dose was yesterday morning 6/29 at 8 AM.  Earliest EGD/EGD able to be performed as tomorrow morning to allow for 48-hour washout of Eliquis.  Patient has legal guardian named [name] that should be contacted for consent.  Constipation: Patient states she is having daily bowel  movements at her facility, however they are very hard and she has been having to strain frequently.  This has been causing intermittent hemorrhoidal issues for her.  If she is able to tolerate p.o. intake will attempt at MiraLAX twice daily and Colace 100 mg daily.  Patient has a legal guardian - [name].  I attempted to contact her on her cell phone at  [phone] to speak with her regarding consent for possible EGD tomorrow. She called back reaching the nursing staff who gave her informed consent, I called her back and verified consent and she denied any questions about the procedure. She stated family would be visiting the patient tomorrow.    Plan / Recommendations   Antiemetics and pain medication per hospitalist Continue to hold Eliquis Trial of clear liquids today N.p.o. at midnight EGD/ED tomorrow with Dr. Abbey Chatters, consent obtained.  Continue PPI BID Miralax 17 g nightly and colace 100 mg daily      07/22/2021, 9:43 AM  Venetia Night, MSN, FNP-BC, AGACNP-BC [facility]

## 2021-07-22 NOTE — Evaluation (Addendum)
Occupational Therapy Evaluation Patient Details Name: [name] MRN: [id number] DOB: Nov 20, 1945 Today's Date: 07/22/2021   History of Present Illness [name] is a 76 y.o. female with medical history significant of hypertension, paroxysmal atrial fibrillation, anxiety/depression, orthostatic hypotension, hyperlipidemia, T2DM, prior history of stroke, history of recurrent lower extremity DVT, GERD who presents to the emergency department due to 4-day onset of intractable nausea, vomiting and abdominal pain.  She states that she presented to the emergency department 3 days ago and abdominal x-ray done at that time was normal and she was discharged home.  She complained of epigastric pain with burning sensation radiation to the chest.  She denies fever, chills, shortness of breath.   Clinical Impression   Pt agreeable to OT and PT evaluation but reporting pain and desire to return to bed after brief attempt of sitting at EOB. Min G to min A needed for supine to sit mobility. Pt able to stand with RW with min to mod A to boost from EOB. Pt assisted for transfers, bathing, and dressing at baseline. Pt able to reach to railing with R UE to assist with scooting up in bed with supervision assist. Pt limited by pain and fatigue this date. Pt left in bed with bed alarm on call bell within reach. Pt will benefit from continued OT in the hospital and recommended venue below to increase strength, balance, and endurance for safe ADL's.        Recommendations for follow up therapy are one component of a multi-disciplinary discharge planning process, led by the attending physician.  Recommendations may be updated based on patient status, additional functional criteria and insurance authorization.   Follow Up Recommendations  Home health OT    Assistance Recommended at Discharge Intermittent Supervision/Assistance(Continued assist from ALF staff)  Patient can return home with the following A lot of  help with walking and/or transfers;A lot of help with bathing/dressing/bathroom;Assistance with cooking/housework;Assist for transportation;Help with stairs or ramp for entrance    Functional Status Assessment  Patient has had a recent decline in their functional status and/or demonstrates limited ability to make significant improvements in function in a reasonable and predictable amount of time  Equipment Recommendations  None recommended by OT    Recommendations for Other Services       Precautions / Restrictions Precautions Precautions: Fall Restrictions Weight Bearing Restrictions: No      Mobility Bed Mobility Overal bed mobility: Needs Assistance Bed Mobility: Supine to Sit, Sit to Supine     Supine to sit: Min guard, Min assist Sit to supine: Min guard   General bed mobility comments: as per PT note    Transfers Overall transfer level: Needs assistance Equipment used: Rolling walker (2 wheels) Transfers: Sit to/from Stand Sit to Stand: Min assist, Mod assist           General transfer comment: transfer to standing with RW with assist for weakness                                                 ADL either performed or assessed with clinical judgement   ADL       Grooming: Sitting;Supervision/safety   Upper Body Bathing: Min guard;Minimal assistance;Sitting   Lower Body Bathing: Maximal assistance;Moderate assistance;Sitting/lateral leans   Upper Body Dressing : Minimal assistance;Min guard;Sitting   Lower Body Dressing:  Maximal assistance;Bed level Lower Body Dressing Details (indicate cue type and reason): assisted to don socks at bed level this date Toilet Transfer: Moderate assistance;Stand-pivot;BSC/3in1 Toilet Transfer Details (indicate cue type and reason): Simulated partially via sit to stand. Toileting- Clothing Manipulation and Hygiene: Min guard;Minimal assistance;Sitting/lateral lean         General ADL Comments:  Most ADL's based on clinical judgement. Pt desireing to get back in bed today.     Vision Baseline Vision/History: 1 Wears glasses Ability to See in Adequate Light: 1 Impaired Patient Visual Report: No change from baseline (Pt reports need for new glasses) Vision Assessment?: No apparent visual deficits     Perception     Praxis      Pertinent Vitals/Pain Pain Assessment Pain Assessment: Faces Faces Pain Scale: Hurts little more Pain Location: back Pain Descriptors / Indicators: Sore Pain Intervention(s): Limited activity within patient's tolerance, Monitored during session, Repositioned     Hand Dominance Right   Extremity/Trunk Assessment Upper Extremity Assessment Upper Extremity Assessment: Generalized weakness (Pt able to functionally reach to RW and at mid level to this therapist's hand during bed mobility.)   Lower Extremity Assessment Lower Extremity Assessment: Defer to PT evaluation   Cervical / Trunk Assessment Cervical / Trunk Assessment: Kyphotic   Communication Communication Communication: No difficulties   Cognition Arousal/Alertness: Awake/alert Behavior During Therapy: WFL for tasks assessed/performed Overall Cognitive Status: Within Functional Limits for tasks assessed                                                        Home Living Family/patient expects to be discharged to:: Assisted living                             Home Equipment: Conservation officer, nature (2 wheels);Wheelchair - manual;Hospital bed          Prior Functioning/Environment Prior Level of Function : Needs assist       Physical Assist : Mobility (physical);ADLs (physical)     Mobility Comments: Assisted for transfers and a few steps using RW during transfers ADLs Comments: asissted by ALF staff. Pt reports independence with toileting, grooming, and eating.        OT Problem List: Decreased strength;Decreased activity tolerance;Impaired  balance (sitting and/or standing)      OT Treatment/Interventions: Self-care/ADL training;Therapeutic exercise;Therapeutic activities;Balance training;Patient/family education    OT Goals(Current goals can be found in the care plan section) Acute Rehab OT Goals Patient Stated Goal: Pt wanted to get back to bed this date. Likely open to return to ALF. OT Goal Formulation: With patient Time For Goal Achievement: 08/05/21 Potential to Achieve Goals: Fair  OT Frequency: Min 1X/week    Co-evaluation PT/OT/SLP Co-Evaluation/Treatment: Yes Reason for Co-Treatment: To address functional/ADL transfers   OT goals addressed during session: ADL's and self-care                       End of Session Equipment Utilized During Treatment: Rolling walker (2 wheels)  Activity Tolerance: Patient limited by fatigue;Patient tolerated treatment well;Other (comment) (Pt reported feeling "sick") Patient left: in bed;with call bell/phone within reach;with bed alarm set  OT Visit Diagnosis: Unsteadiness on feet (R26.81);Other abnormalities of gait and mobility (R26.89);Muscle weakness (generalized) (M62.81)  Time: 8381-8403 OT Time Calculation (min): 11 min Charges:  OT General Charges $OT Visit: 1 Visit OT Evaluation $OT Eval Low Complexity: 1 Low  Reegan Mctighe OT, MOT   Larey Seat 07/22/2021, 2:23 PM

## 2021-07-22 NOTE — Evaluation (Signed)
Physical Therapy Evaluation Patient Details Name: [name] MRN: [id number] DOB: [birth year] Today's Date: 07/22/2021  History of Present Illness  [name] is a 76 y.o. female with medical history significant of hypertension, paroxysmal atrial fibrillation, anxiety/depression, orthostatic hypotension, hyperlipidemia, T2DM, prior history of stroke, history of recurrent lower extremity DVT, GERD who presents to the emergency department due to 4-day onset of intractable nausea, vomiting and abdominal pain.  She states that she presented to the emergency department 3 days ago and abdominal x-ray done at that time was normal and she was discharged home.  She complained of epigastric pain with burning sensation radiation to the chest.  She denies fever, chills, shortness of breath.   Clinical Impression  Patient limited for functional mobility as stated below secondary to BLE weakness, fatigue and poor standing balance. Patient functioning near baseline as she is typically assisted for mobility and ADL by ALF staff. She demonstrates generalized weakness but is able to complete bed mobility and transfer to standing with assist and use of RW. Patient will benefit from continued physical therapy in hospital and recommended venue below to increase strength, balance, endurance for safe ADLs and gait.        Recommendations for follow up therapy are one component of a multi-disciplinary discharge planning process, led by the attending physician.  Recommendations may be updated based on patient status, additional functional criteria and insurance authorization.  Follow Up Recommendations Home health PT      Assistance Recommended at Discharge Frequent or constant Supervision/Assistance  Patient can return home with the following  A little help with bathing/dressing/bathroom;A lot of help with walking and/or transfers;Assistance with cooking/housework    Equipment Recommendations None  recommended by PT  Recommendations for Other Services       Functional Status Assessment Patient has had a recent decline in their functional status and demonstrates the ability to make significant improvements in function in a reasonable and predictable amount of time.     Precautions / Restrictions Precautions Precautions: Fall Restrictions Weight Bearing Restrictions: No      Mobility  Bed Mobility Overal bed mobility: Needs Assistance Bed Mobility: Supine to Sit, Sit to Supine     Supine to sit: Min guard, Min assist Sit to supine: Min guard   General bed mobility comments: labored, assist to pull to seated and cueing required for scooting    Transfers Overall transfer level: Needs assistance Equipment used: Rolling walker (2 wheels) Transfers: Sit to/from Stand Sit to Stand: Min assist, Mod assist           General transfer comment: transfer to standing with RW with assist for weakness    Ambulation/Gait                  Stairs            Wheelchair Mobility    Modified Rankin (Stroke Patients Only)       Balance Overall balance assessment: Needs assistance Sitting-balance support: No upper extremity supported, Feet supported Sitting balance-Leahy Scale: Good Sitting balance - Comments: seated EOB   Standing balance support: Bilateral upper extremity supported, Reliant on assistive device for balance Standing balance-Leahy Scale: Poor Standing balance comment: with RW                             Pertinent Vitals/Pain Pain Assessment Pain Assessment: Faces Faces Pain Scale: Hurts little more Pain Location: back Pain Descriptors /  Indicators: Sore Pain Intervention(s): Limited activity within patient's tolerance, Monitored during session, Repositioned    Home Living Family/patient expects to be discharged to:: Assisted living                 Home Equipment: Rolling Walker (2 wheels);Wheelchair - manual;Hospital  bed      Prior Function Prior Level of Function : Needs assist       Physical Assist : Mobility (physical)     Mobility Comments: Assisted for transfers and a few steps using RW during transfers ADLs Comments: asissted by ALF staff     Hand Dominance   Dominant Hand: Right    Extremity/Trunk Assessment   Upper Extremity Assessment Upper Extremity Assessment: Defer to OT evaluation    Lower Extremity Assessment Lower Extremity Assessment: Generalized weakness    Cervical / Trunk Assessment Cervical / Trunk Assessment: Kyphotic  Communication   Communication: No difficulties  Cognition Arousal/Alertness: Awake/alert Behavior During Therapy: WFL for tasks assessed/performed Overall Cognitive Status: Within Functional Limits for tasks assessed                                          General Comments      Exercises     Assessment/Plan    PT Assessment Patient needs continued PT services  PT Problem List Decreased strength;Decreased mobility;Decreased activity tolerance;Decreased balance;Pain       PT Treatment Interventions DME instruction;Therapeutic activities;Gait training;Therapeutic exercise;Stair training;Balance training;Functional mobility training;Neuromuscular re-education;Patient/family education    PT Goals (Current goals can be found in the Care Plan section)  Acute Rehab PT Goals Patient Stated Goal: return to ALF PT Goal Formulation: With patient Time For Goal Achievement: 08/05/21 Potential to Achieve Goals: Good    Frequency Min 3X/week     Co-evaluation               AM-PAC PT "6 Clicks" Mobility  Outcome Measure Help needed turning from your back to your side while in a flat bed without using bedrails?: A Little Help needed moving from lying on your back to sitting on the side of a flat bed without using bedrails?: A Little Help needed moving to and from a bed to a chair (including a wheelchair)?: A Lot Help  needed standing up from a chair using your arms (e.g., wheelchair or bedside chair)?: A Lot Help needed to walk in hospital room?: A Lot Help needed climbing 3-5 steps with a railing? : A Lot 6 Click Score: 14    End of Session   Activity Tolerance: Patient limited by fatigue;Patient limited by pain Patient left: in bed;with call bell/phone within reach;with bed alarm set Nurse Communication: Mobility status PT Visit Diagnosis: Unsteadiness on feet (R26.81);Other abnormalities of gait and mobility (R26.89);Muscle weakness (generalized) (M62.81)    Time: 5456-2563 PT Time Calculation (min) (ACUTE ONLY): 12 min   Charges:   PT Evaluation $PT Eval Low Complexity: 1 Low         2:12 PM, 07/22/21 Mearl Latin PT, DPT Physical Therapist at [facility]

## 2021-07-22 NOTE — Progress Notes (Signed)
76 year old lady with hypertension, PAF, anxiety , depression, hypotension, hyperlipidemia, type 2 DM, stroke, DVT, GERD, admitted for nausea, vomiting and abdominal pain. CT abdomen and pelvis with contrast showed debris in the distal esophagus with moderate distention. Distal esophageal obstruction process not excluded.    GI consulted and plan for EGD in am.  Plan for IV fluids, IV anti emetics. And IV protonix BID.  Clear liquids for today and NPO after midnight.    Hosie Poisson MD.

## 2021-07-23 ENCOUNTER — Encounter (HOSPITAL_COMMUNITY): Payer: Self-pay | Admitting: Internal Medicine

## 2021-07-23 ENCOUNTER — Inpatient Hospital Stay (HOSPITAL_COMMUNITY): Payer: Medicare Other | Admitting: Anesthesiology

## 2021-07-23 ENCOUNTER — Encounter (HOSPITAL_COMMUNITY)
Admission: EM | Disposition: A | Discharge status: Other Acute Inpatient Hospital | Payer: Self-pay | Source: Home / Self Care | Attending: Internal Medicine

## 2021-07-23 DIAGNOSIS — Z794 Long term (current) use of insulin: Secondary | ICD-10-CM

## 2021-07-23 DIAGNOSIS — I82409 Acute embolism and thrombosis of unspecified deep veins of unspecified lower extremity: Secondary | ICD-10-CM | POA: Diagnosis not present

## 2021-07-23 DIAGNOSIS — I10 Essential (primary) hypertension: Secondary | ICD-10-CM | POA: Diagnosis not present

## 2021-07-23 DIAGNOSIS — T18128A Food in esophagus causing other injury, initial encounter: Secondary | ICD-10-CM | POA: Diagnosis not present

## 2021-07-23 DIAGNOSIS — E1169 Type 2 diabetes mellitus with other specified complication: Secondary | ICD-10-CM

## 2021-07-23 DIAGNOSIS — I482 Chronic atrial fibrillation, unspecified: Secondary | ICD-10-CM | POA: Diagnosis not present

## 2021-07-23 DIAGNOSIS — R9389 Abnormal findings on diagnostic imaging of other specified body structures: Secondary | ICD-10-CM

## 2021-07-23 DIAGNOSIS — R112 Nausea with vomiting, unspecified: Secondary | ICD-10-CM | POA: Diagnosis not present

## 2021-07-23 HISTORY — PX: ESOPHAGOGASTRODUODENOSCOPY (EGD) WITH PROPOFOL: SHX5813

## 2021-07-23 HISTORY — PX: ESOPHAGEAL DILATION: SHX303

## 2021-07-23 LAB — BASIC METABOLIC PANEL
Anion gap: 5 (ref 5–15)
BUN: 11 mg/dL (ref 8–23)
CO2: 27 mmol/L (ref 22–32)
Calcium: 8.5 mg/dL — ABNORMAL LOW (ref 8.9–10.3)
Chloride: 105 mmol/L (ref 98–111)
Creatinine, Ser: 0.71 mg/dL (ref 0.44–1.00)
GFR, Estimated: >60 mL/min (ref >60)
Glucose, Bld: 150 mg/dL — ABNORMAL HIGH (ref 70–99)
Potassium: 3.5 mmol/L (ref 3.5–5.1)
Sodium: 137 mmol/L (ref 135–145)

## 2021-07-23 LAB — CBC WITH DIFFERENTIAL/PLATELET
Abs Immature Granulocytes: 0.02 10*3/uL (ref 0.00–0.07)
Basophils Absolute: 0 10*3/uL (ref 0.0–0.1)
Basophils Relative: 1 %
Eosinophils Absolute: 0.1 10*3/uL (ref 0.0–0.5)
Eosinophils Relative: 2 %
HCT: 37.4 % (ref 36.0–46.0)
Hemoglobin: 11.9 g/dL — ABNORMAL LOW (ref 12.0–15.0)
Immature Granulocytes: 0 %
Lymphocytes Relative: 19 %
Lymphs Abs: 1.2 10*3/uL (ref 0.7–4.0)
MCH: 27.3 pg (ref 26.0–34.0)
MCHC: 31.8 g/dL (ref 30.0–36.0)
MCV: 85.8 fL (ref 80.0–100.0)
Monocytes Absolute: 0.4 10*3/uL (ref 0.1–1.0)
Monocytes Relative: 7 %
Neutro Abs: 4.6 10*3/uL (ref 1.7–7.7)
Neutrophils Relative %: 71 %
Platelets: 241 10*3/uL (ref 150–400)
RBC: 4.36 MIL/uL (ref 3.87–5.11)
RDW: 14.5 % (ref 11.5–15.5)
WBC: 6.4 10*3/uL (ref 4.0–10.5)
nRBC: 0 % (ref 0.0–0.2)

## 2021-07-23 LAB — GLUCOSE, CAPILLARY
Glucose-Capillary: 119 mg/dL — ABNORMAL HIGH (ref 70–99)
Glucose-Capillary: 149 mg/dL — ABNORMAL HIGH (ref 70–99)
Glucose-Capillary: 151 mg/dL — ABNORMAL HIGH (ref 70–99)
Glucose-Capillary: 160 mg/dL — ABNORMAL HIGH (ref 70–99)
Glucose-Capillary: 167 mg/dL — ABNORMAL HIGH (ref 70–99)
Glucose-Capillary: 176 mg/dL — ABNORMAL HIGH (ref 70–99)
Glucose-Capillary: 228 mg/dL — ABNORMAL HIGH (ref 70–99)

## 2021-07-23 SURGERY — ESOPHAGOGASTRODUODENOSCOPY (EGD) WITH PROPOFOL
Anesthesia: General

## 2021-07-23 MED ORDER — PROPOFOL 10 MG/ML IV BOLUS
INTRAVENOUS | Status: DC | PRN
  Administered 2021-07-23: 140 mg via INTRAVENOUS

## 2021-07-23 MED ORDER — PROPOFOL 10 MG/ML IV BOLUS
INTRAVENOUS | Status: AC
  Filled 2021-07-23: qty 20

## 2021-07-23 MED ORDER — OXYBUTYNIN CHLORIDE ER 5 MG PO TB24
5.0000 mg | ORAL_TABLET | Freq: Every day | ORAL | Status: DC
  Administered 2021-07-23 – 2021-07-27 (×5): 5 mg via ORAL
  Filled 2021-07-23 (×5): qty 1

## 2021-07-23 MED ORDER — MIRTAZAPINE 30 MG PO TABS
30.0000 mg | ORAL_TABLET | Freq: Every day | ORAL | Status: DC
  Administered 2021-07-24 – 2021-07-26 (×3): 30 mg via ORAL
  Filled 2021-07-23 (×3): qty 1

## 2021-07-23 MED ORDER — SODIUM CHLORIDE 0.9 % IV SOLN: INTRAVENOUS | Status: DC

## 2021-07-23 MED ORDER — RISPERIDONE 1 MG PO TABS
1.0000 mg | ORAL_TABLET | Freq: Every day | ORAL | Status: DC
  Administered 2021-07-23 – 2021-07-27 (×5): 1 mg via ORAL
  Filled 2021-07-23 (×5): qty 1

## 2021-07-23 MED ORDER — RISPERIDONE 1 MG PO TABS
2.0000 mg | ORAL_TABLET | Freq: Every day | ORAL | Status: DC
  Administered 2021-07-24 – 2021-07-26 (×3): 2 mg via ORAL
  Filled 2021-07-23 (×3): qty 2

## 2021-07-23 MED ORDER — SUCCINYLCHOLINE CHLORIDE 200 MG/10ML IV SOSY
PREFILLED_SYRINGE | INTRAVENOUS | Status: DC | PRN
  Administered 2021-07-23: 100 mg via INTRAVENOUS

## 2021-07-23 MED ORDER — PHENYLEPHRINE HCL (PRESSORS) 10 MG/ML IV SOLN
INTRAVENOUS | Status: DC | PRN
  Administered 2021-07-23 (×2): 400 ug via INTRAVENOUS
  Administered 2021-07-23: 80 ug via INTRAVENOUS

## 2021-07-23 MED ORDER — METOCLOPRAMIDE HCL 10 MG PO TABS
5.0000 mg | ORAL_TABLET | Freq: Three times a day (TID) | ORAL | Status: DC
  Administered 2021-07-23 – 2021-07-27 (×11): 5 mg via ORAL
  Filled 2021-07-23 (×11): qty 1

## 2021-07-23 MED ORDER — APIXABAN 2.5 MG PO TABS
2.5000 mg | ORAL_TABLET | Freq: Two times a day (BID) | ORAL | Status: DC
  Administered 2021-07-24: 2.5 mg via ORAL
  Filled 2021-07-23 (×2): qty 1

## 2021-07-23 MED ORDER — CLONAZEPAM 0.5 MG PO TABS
0.5000 mg | ORAL_TABLET | Freq: Three times a day (TID) | ORAL | Status: DC | PRN
  Administered 2021-07-23 – 2021-07-27 (×8): 0.5 mg via ORAL
  Filled 2021-07-23 (×10): qty 1

## 2021-07-23 MED ORDER — METOPROLOL TARTRATE 5 MG/5ML IV SOLN
INTRAVENOUS | Status: AC
  Filled 2021-07-23: qty 5

## 2021-07-23 MED ORDER — INSULIN DETEMIR 100 UNIT/ML ~~LOC~~ SOLN
22.0000 [IU] | Freq: Every day | SUBCUTANEOUS | Status: DC
  Administered 2021-07-23 – 2021-07-24 (×2): 22 [IU] via SUBCUTANEOUS
  Filled 2021-07-23 (×3): qty 0.22

## 2021-07-23 MED ORDER — DULOXETINE HCL 60 MG PO CPEP
120.0000 mg | ORAL_CAPSULE | Freq: Every day | ORAL | Status: DC
  Administered 2021-07-23 – 2021-07-27 (×5): 120 mg via ORAL
  Filled 2021-07-23 (×5): qty 2

## 2021-07-23 MED ORDER — GABAPENTIN 300 MG PO CAPS
300.0000 mg | ORAL_CAPSULE | Freq: Three times a day (TID) | ORAL | Status: DC | PRN
  Administered 2021-07-23 – 2021-07-26 (×3): 300 mg via ORAL
  Filled 2021-07-23 (×4): qty 1

## 2021-07-23 MED ORDER — SENNA 8.6 MG PO TABS
1.0000 | ORAL_TABLET | Freq: Every day | ORAL | Status: DC
Start: 2021-07-23 — End: 2021-07-27
  Administered 2021-07-23 – 2021-07-27 (×5): 8.6 mg via ORAL
  Filled 2021-07-23 (×8): qty 1

## 2021-07-23 MED ORDER — LATANOPROST 0.005 % OP SOLN
1.0000 [drp] | Freq: Every day | OPHTHALMIC | Status: DC
  Administered 2021-07-24 – 2021-07-26 (×3): 1 [drp] via OPHTHALMIC
  Filled 2021-07-23 (×3): qty 2.5

## 2021-07-23 MED ORDER — MORPHINE SULFATE (PF) 2 MG/ML IV SOLN
1.0000 mg | INTRAVENOUS | Status: DC | PRN
  Administered 2021-07-23 – 2021-07-27 (×12): 1 mg via INTRAVENOUS
  Filled 2021-07-23 (×12): qty 1

## 2021-07-23 MED ORDER — LACTATED RINGERS IV SOLN: INTRAVENOUS | Status: DC

## 2021-07-23 MED ORDER — LACTATED RINGERS IV SOLN: INTRAVENOUS | Status: DC | PRN

## 2021-07-23 MED ORDER — PANTOPRAZOLE SODIUM 40 MG PO TBEC
40.0000 mg | DELAYED_RELEASE_TABLET | Freq: Two times a day (BID) | ORAL | Status: DC
  Administered 2021-07-23 – 2021-07-27 (×8): 40 mg via ORAL
  Filled 2021-07-23 (×8): qty 1

## 2021-07-23 MED ORDER — EPHEDRINE SULFATE (PRESSORS) 50 MG/ML IJ SOLN
INTRAMUSCULAR | Status: DC | PRN
  Administered 2021-07-23 (×2): 10 mg via INTRAVENOUS

## 2021-07-23 MED ORDER — BUSPIRONE HCL 5 MG PO TABS
7.5000 mg | ORAL_TABLET | Freq: Two times a day (BID) | ORAL | Status: DC
  Administered 2021-07-24 – 2021-07-27 (×7): 7.5 mg via ORAL
  Filled 2021-07-23 (×7): qty 2

## 2021-07-23 MED ORDER — METOPROLOL SUCCINATE ER 50 MG PO TB24
50.0000 mg | ORAL_TABLET | Freq: Every day | ORAL | Status: DC
  Administered 2021-07-23 – 2021-07-24 (×2): 50 mg via ORAL
  Filled 2021-07-23 (×3): qty 1

## 2021-07-23 MED ORDER — ALBUTEROL SULFATE (2.5 MG/3ML) 0.083% IN NEBU: 2.5000 mg | INHALATION_SOLUTION | Freq: Four times a day (QID) | RESPIRATORY_TRACT | Status: DC | PRN

## 2021-07-23 NOTE — Anesthesia Procedure Notes (Signed)
Procedure Name: Intubation Date/Time: 07/23/2021 10:28 AM  Performed by: Louann Sjogren, MDPre-anesthesia Checklist: Patient identified, Emergency Drugs available, Suction available and Patient being monitored Patient Re-evaluated:Patient Re-evaluated prior to induction Oxygen Delivery Method: Circle system utilized Preoxygenation: Pre-oxygenation with 100% oxygen Induction Type: IV induction, Rapid sequence and Cricoid Pressure applied Ventilation: Mask ventilation without difficulty Laryngoscope Size: Mac and 3 Grade View: Grade I Tube type: Oral Laser Tube: Cuffed inflated with minimal occlusive pressure - saline Tube size: 7.0 mm Number of attempts: 1 Airway Equipment and Method: Stylet and Oral airway Placement Confirmation: ETT inserted through vocal cords under direct vision, positive ETCO2 and breath sounds checked- equal and bilateral Tube secured with: Tape Dental Injury: Teeth and Oropharynx as per pre-operative assessment

## 2021-07-23 NOTE — Anesthesia Postprocedure Evaluation (Signed)
Anesthesia Post Note  Patient: [name]  Procedure(s) Performed: ESOPHAGOGASTRODUODENOSCOPY (EGD) WITH PROPOFOL ESOPHAGEAL DILATION  Patient location during evaluation: Specials Recovery Anesthesia Type: General Level of consciousness: awake and alert Pain management: pain level controlled Vital Signs Assessment: post-procedure vital signs reviewed and stable Respiratory status: spontaneous breathing, nonlabored ventilation, respiratory function stable and patient connected to nasal cannula oxygen Cardiovascular status: blood pressure returned to baseline and stable Postop Assessment: no apparent nausea or vomiting Anesthetic complications: no   No notable events documented.   Last Vitals:  Vitals:   07/23/21 1110 07/23/21 1115  BP:  107/70  Pulse: (!) 102 (!) 101  Resp: (!) 22 (!) 27  Temp:    SpO2: 96% 96%    Last Pain:  Vitals:   07/23/21 1110  TempSrc:   PainSc: [place]

## 2021-07-23 NOTE — Progress Notes (Signed)
[facility]                                                                               Datha Kissinger, is a 76 y.o. female, DOB - 31-Aug-1945, PZW:[id number] Admit date - 07/21/2021    Outpatient Primary MD for the patient is [name], Normajean Baxter, MD  LOS - 2  days    Brief summary   76 year old lady with hypertension, PAF, anxiety , depression, hypotension, hyperlipidemia, type 2 DM, stroke, DVT, GERD, admitted for nausea, vomiting and abdominal pain. CT abdomen and pelvis with contrast showed debris in the distal esophagus with moderate distention. Distal esophageal obstruction process not excluded.   GI consulted , and she underwent EGD today, showing food int he esophagus, which was removed by the roth net and some of it pushed in to the stomach.    Assessment & Plan    Assessment and Plan:   Nausea, vomiting secondary to esophageal stricture in the lower part of the esophagus.  She underwent by Dr Abbey Chatters was found to have esophagus filled with food. 90% of it was removed by Jabier Mutton net and the rest pushed in to the stomach.  She had atrial flutter during the procedure and it was stopped .  GI recommends full liquid diet, PPI BID, repeat EGD in 4 to 6 weeks.    PAF:  Rate well controlled, continue with amiodarone, metoprolol and cardizem.  Continue with Eliquis for anti coagulation.    Hyperlipidemia:  Resume statin.    H/O stroke  Continue with eliquis and statin.     Hypertension: BP parameters are well controlled.   GERD  Stable.    Type 2 DM insulin dependent with hyperglycemia.  CBG (last 3)  Recent Labs    07/23/21 0744 07/23/21 1003 07/23/21 1137  GLUCAP 149* 160* 176*   Resume SSI.  Continue with levemir 22 units daily.       RN Pressure Injury Documentation:    Malnutrition Type:  Nutrition Problem: Inadequate oral intake Etiology: nausea, vomiting   Malnutrition Characteristics:  Signs/Symptoms: per  patient/family report   Nutrition Interventions:     Estimated body mass index is 20.27 kg/m as calculated from the following:   Height as of this encounter: '5\' 7"'$  (1.702 m).   Weight as of this encounter: 58.7 kg.  Code Status: Full code.  DVT Prophylaxis:  SCDs Start: 07/22/21 0131   Level of Care: Level of care: Med-Surg Family Communication: None at bedside.   Disposition Plan:     Remains inpatient appropriate:  possible d/c in am.   Procedures:  EGD.   Consultants:   Gastroenterology.   Antimicrobials:   Anti-infectives (From admission, onward)    Start     Dose/Rate Route Frequency Ordered Stop   07/21/21 2345  cefTRIAXone (ROCEPHIN) 1 g in sodium chloride 0.9 % 100 mL IVPB        1 g 200 mL/hr over 30 Minutes Intravenous  Once 07/21/21 2332 07/22/21 0034        Medications  Scheduled Meds:  amiodarone  200 mg Oral Daily   atorvastatin  40 mg Oral QHS   diltiazem  120 mg Oral Daily   docusate sodium  100 mg Oral Daily   insulin aspart  0-9 Units Subcutaneous Q4H   midodrine  10 mg Oral TID   pantoprazole (PROTONIX) IV  40 mg Intravenous Q12H   polyethylene glycol  17 g Oral Nightly   Continuous Infusions:  sodium chloride 20 mL/hr at 07/23/21 1219   lactated ringers Stopped (07/23/21 1219)   PRN Meds:.morphine injection, ondansetron (ZOFRAN) IV, prochlorperazine    Subjective:   Kilyn Maragh was seen and examined today.  No new complaints today.  Objective:   Vitals:   07/23/21 1100 07/23/21 1110 07/23/21 1115 07/23/21 1442  BP: (!) 110/59  107/70 106/64  Pulse: 78 (!) 102 (!) 101 91  Resp: (!) 21 (!) 22 (!) 27 20  Temp: 98.1 F (36.7 C)   98.3 F (36.8 C)  TempSrc:    Oral  SpO2: 96% 96% 96% 97%  Weight:      Height:        Intake/Output Summary (Last 24 hours) at 07/23/2021 1523 Last data filed at 07/23/2021 1500 Gross per 24 hour  Intake 315.16 ml  Output 1800 ml  Net -1484.84 ml   Filed Weights   07/21/21 1951 07/22/21  0058  Weight: 60.8 kg 58.7 kg     Exam General exam: Appears calm and comfortable  Respiratory system: Clear to auscultation. Respiratory effort normal. Cardiovascular system: S1 & S2 heard, RRR. No JVD,No pedal edema. Gastrointestinal system: Abdomen is nondistended, soft and nontender. Normal bowel sounds heard. Central nervous system: Alert and oriented. No focal neurological deficits. Extremities: Symmetric 5 x 5 power. Skin: No rashes, lesions or ulcers Psychiatry:  Mood & affect appropriate.     Data Reviewed:  I have personally reviewed following labs and imaging studies   CBC Lab Results  Component Value Date   WBC 6.4 07/23/2021   RBC 4.36 07/23/2021   HGB 11.9 (L) 07/23/2021   HCT 37.4 07/23/2021   MCV 85.8 07/23/2021   MCH 27.3 07/23/2021   PLT 241 07/23/2021   MCHC 31.8 07/23/2021   RDW 14.5 07/23/2021   LYMPHSABS 1.2 07/23/2021   MONOABS 0.4 07/23/2021   EOSABS 0.1 07/23/2021   BASOSABS 0.0 79/02/4095     Last metabolic panel Lab Results  Component Value Date   NA 137 07/23/2021   K 3.5 07/23/2021   CL 105 07/23/2021   CO2 27 07/23/2021   BUN 11 07/23/2021   CREATININE 0.71 07/23/2021   GLUCOSE 150 (H) 07/23/2021   GFRNONAA >60 07/23/2021   GFRAA >60 10/05/2019   CALCIUM 8.5 (L) 07/23/2021   PHOS 3.5 07/22/2021   PROT 6.9 07/22/2021   ALBUMIN 3.4 (L) 07/22/2021   LABGLOB 2.7 12/02/2020   AGRATIO 1.6 12/02/2020   BILITOT 0.4 07/22/2021   ALKPHOS 64 07/22/2021   AST 12 (L) 07/22/2021   ALT 11 07/22/2021   ANIONGAP 5 07/23/2021    CBG (last 3)  Recent Labs    07/23/21 0744 07/23/21 1003 07/23/21 1137  GLUCAP 149* 160* 176*      Coagulation Profile: No results for input(s): "INR", "PROTIME" in the last 168 hours.   Radiology Studies: CT ABDOMEN PELVIS W CONTRAST  Result Date: 07/21/2021 CLINICAL DATA:  Epigastric pain. EXAM: CT ABDOMEN AND PELVIS WITH CONTRAST TECHNIQUE: Multidetector CT imaging of the abdomen and pelvis was  performed using the standard protocol following bolus administration of intravenous contrast. RADIATION DOSE REDUCTION: This exam was performed according to the departmental dose-optimization program  which includes automated exposure control, adjustment of the mA and/or kV according to patient size and/or use of iterative reconstruction technique. CONTRAST:  199m OMNIPAQUE IOHEXOL 300 MG/ML  SOLN COMPARISON:  CT abdomen and pelvis 04/23/2021 FINDINGS: Lower chest: There is a calcified granuloma in the right lower lobe, unchanged. No acute findings. Hepatobiliary: Hepatic hemangioma measuring up to 3.3 cm appears unchanged. No new liver lesions. Gallstones are again noted. There is no bile duct dilatation. Pancreas: Unremarkable. No pancreatic ductal dilatation or surrounding inflammatory changes. Spleen: Normal in size without focal abnormality. Adrenals/Urinary Tract: Adrenal glands are unremarkable. Kidneys are normal, without renal calculi, focal lesion, or hydronephrosis. Bladder is unremarkable. Stomach/Bowel: Appendix appears normal. No evidence of bowel wall thickening, distention, or inflammatory changes. There is a large amount of stool in the rectosigmoid colon. There is a small hiatal hernia. The remaining stomach is within normal limits. There is distention and debris in the distal esophagus. Vascular/Lymphatic: Aortic atherosclerosis. No enlarged abdominal or pelvic lymph nodes. Reproductive: Uterus and bilateral adnexa are unremarkable. Other: No abdominal wall hernia or abnormality. No abdominopelvic ascites. Musculoskeletal: Degenerative changes affect the spine. There is stable grade 1 anterolisthesis at L4-L5. IMPRESSION: 1. There is debris in the distal esophagus with moderate distention. This can be seen with gastroesophageal reflux. Distal esophageal obstructing process not excluded. Please correlate clinically. 2. Small hiatal hernia. 3. Large amount of stool in the rectosigmoid colon. 4.  Cholelithiasis. 5.  Aortic Atherosclerosis (ICD10-I70.0). Electronically Signed   By: ARonney AstersM.D.   On: 07/21/2021 22:39   DG Chest 2 View  Result Date: 07/21/2021 CLINICAL DATA:  Chest pain EXAM: CHEST - 2 VIEW COMPARISON:  07/18/2021, 07/07/2021 FINDINGS: Cardiomegaly. Increased opacity at the left lung base. Aortic atherosclerosis. No pneumothorax IMPRESSION: 1. Cardiomegaly with interim mild airspace disease at the peripheral left base, atelectasis versus pneumonia Electronically Signed   By: KDonavan FoilM.D.   On: 07/21/2021 21:18       VHosie PoissonM.D. [facility] 07/23/2021, 3:23 PM  Available via Epic secure chat 7am-7pm After 7 pm, please refer to night coverage provider listed on amion.

## 2021-07-23 NOTE — Interval H&P Note (Signed)
History and Physical Interval Note:  07/23/2021 10:04 AM  [name]  has presented today for surgery, with the diagnosis of Intractable nausea and vomiting, reflux, epigastric pain, concern for esophageal obstruction.  The various methods of treatment have been discussed with the patient and family. After consideration of risks, benefits and other options for treatment, the patient has consented to  Procedure(s): ESOPHAGOGASTRODUODENOSCOPY (EGD) WITH PROPOFOL (N/A) ESOPHAGEAL DILATION (N/A) as a surgical intervention.  The patient's history has been reviewed, patient examined, no change in status, stable for surgery.  I have reviewed the patient's chart and labs.  Questions were answered to the patient's satisfaction.     Eloise Harman

## 2021-07-23 NOTE — Op Note (Signed)
[facility] Patient Name: [name] Procedure Date: 07/23/2021 9:33 AM MRN: [id number] Date of Birth: 1945-10-18 Attending MD: Elon Alas. Abbey Chatters DO CSN: [id number] Age: 76 Admit Type: Inpatient Procedure:                Upper GI endoscopy Indications:              Dysphagia, Heartburn, Nausea with vomiting Providers:                Elon Alas. Abbey Chatters, DO, Lurline Del, RN, Wynonia Musty Tech, Technician Referring MD:              Medicines:                See the Anesthesia note for documentation of the                            administered medications Complications:            Arrhythmia Estimated Blood Loss:     Estimated blood loss: none. Procedure:                Pre-Anesthesia Assessment:                           - The anesthesia plan was to use general anesthesia.                           After obtaining informed consent, the endoscope was                            passed under direct vision. Throughout the                            procedure, the patient's blood pressure, pulse, and                            oxygen saturations were monitored continuously. The                            [id number]-H190 ([id number]) scope was introduced through the                            mouth, and advanced to the second part of duodenum.                            The upper GI endoscopy was accomplished without                            difficulty. The patient tolerated the procedure                            fairly well. Scope In: 10:10:27 AM Scope Out: 10:52:00 AM Total Procedure Duration: 0 hours 41 minutes 33 seconds  Findings:      Food was found in the middle third of  the esophagus and in the lower       third of the esophagus. Endosocpe withdrawn and patient intubated by       anesthesia due to aspiration risk. Endoscope then reinserted with 90%       removal of food accomplished with Jabier Mutton net. Remaining 10% was gently       pushed into stomach  with endoscope. Distal esophageal stricture noted.       Preparations were made for esophageal dilation. Patient's rhythm acutely       changed from afib to what appeared ot be atrial flutter 4-5:1 and became       hypotensive. Endoscope was removed and patient's rhythm flipped back to       atrial fibrillation with improved blood pressure. Procedure was then       stopped. No dilation performed. Impression:               - Food in the middle third of the esophagus and in                            the lower third of the esophagus. Removal was                            successful. Moderate Sedation:      Per Anesthesia Care Recommendation:           - Return patient to hospital ward for ongoing care.                           - Full liquid diet.                           - Use a proton pump inhibitor PO BID for 12 weeks.                           - Okay to resume Eliquis immediately.                           - Repeat EGD with dilation in outpatient setting in                            4-6 weeks. Procedure Code(s):        --- Professional ---                           [phone], Esophagogastroduodenoscopy, flexible,                            transoral; with removal of foreign body(s) Diagnosis Code(s):        --- Professional ---                           Q33.007M, Food in esophagus causing other injury,                            initial encounter  R13.10, Dysphagia, unspecified                           R12, Heartburn                           R11.2, Nausea with vomiting, unspecified CPT copyright 2019 [employer]. All rights reserved. The codes documented in this report are preliminary and upon coder review may  be revised to meet current compliance requirements. Elon Alas. Abbey Chatters, DO [place] Abbey Chatters, DO 07/23/2021 11:09:55 AM This report has been signed electronically. Number of Addenda: 0

## 2021-07-23 NOTE — Transfer of Care (Signed)
Immediate Anesthesia Transfer of Care Note  Patient: [name]  Procedure(s) Performed: ESOPHAGOGASTRODUODENOSCOPY (EGD) WITH PROPOFOL ESOPHAGEAL DILATION  Patient Location: PACU  Anesthesia Type:General  Level of Consciousness: awake and alert   Airway & Oxygen Therapy: Patient Spontanous Breathing  Post-op Assessment: Report given to RN and Post -op Vital signs reviewed and stable  Post vital signs: Reviewed and stable  Last Vitals:  Vitals Value Taken Time  BP 107/70 07/23/21 1115  Temp    Pulse 110 07/23/21 1117  Resp 30 07/23/21 1117  SpO2 95 % 07/23/21 1117  Vitals shown include unvalidated device data.  Last Pain:  Vitals:   07/23/21 1110  TempSrc:   PainSc: 9          Complications: No notable events documented.

## 2021-07-23 NOTE — Anesthesia Preprocedure Evaluation (Signed)
Anesthesia Evaluation  Patient identified by MRN, date of birth, ID band Patient awake    Reviewed: Allergy & Precautions, H&P , NPO status , Patient's Chart, lab work & pertinent test results, reviewed documented beta blocker date and time   Airway Mallampati: II  TM Distance: >3 FB Neck ROM: full    Dental no notable dental hx.    Pulmonary neg pulmonary ROS, former smoker,    Pulmonary exam normal breath sounds clear to auscultation       Cardiovascular Exercise Tolerance: Good hypertension, + dysrhythmias Atrial Fibrillation  Rhythm:regular Rate:Normal     Neuro/Psych PSYCHIATRIC DISORDERS Anxiety Depression Schizophrenia negative neurological ROS     GI/Hepatic Neg liver ROS, GERD  Medicated,  Endo/Other  diabetes, Type 2Hyperthyroidism   Renal/GU negative Renal ROS  negative genitourinary   Musculoskeletal   Abdominal   Peds  Hematology  (+) Blood dyscrasia, anemia ,   Anesthesia Other Findings   Reproductive/Obstetrics negative OB ROS                             Anesthesia Physical Anesthesia Plan  ASA: 3  Anesthesia Plan: General   Post-op Pain Management:    Induction:   PONV Risk Score and Plan: Propofol infusion  Airway Management Planned:   Additional Equipment:   Intra-op Plan:   Post-operative Plan:   Informed Consent: I have reviewed the patients History and Physical, chart, labs and discussed the procedure including the risks, benefits and alternatives for the proposed anesthesia with the patient or authorized representative who has indicated his/her understanding and acceptance.     Dental Advisory Given  Plan Discussed with: CRNA  Anesthesia Plan Comments:         Anesthesia Quick Evaluation

## 2021-07-24 DIAGNOSIS — T18128A Food in esophagus causing other injury, initial encounter: Secondary | ICD-10-CM | POA: Diagnosis not present

## 2021-07-24 DIAGNOSIS — E782 Mixed hyperlipidemia: Secondary | ICD-10-CM | POA: Diagnosis not present

## 2021-07-24 DIAGNOSIS — F1120 Opioid dependence, uncomplicated: Secondary | ICD-10-CM

## 2021-07-24 DIAGNOSIS — E1165 Type 2 diabetes mellitus with hyperglycemia: Secondary | ICD-10-CM

## 2021-07-24 DIAGNOSIS — F203 Undifferentiated schizophrenia: Secondary | ICD-10-CM

## 2021-07-24 DIAGNOSIS — R112 Nausea with vomiting, unspecified: Secondary | ICD-10-CM | POA: Diagnosis not present

## 2021-07-24 LAB — BASIC METABOLIC PANEL
Anion gap: 4 — ABNORMAL LOW (ref 5–15)
BUN: 15 mg/dL (ref 8–23)
CO2: 28 mmol/L (ref 22–32)
Calcium: 8.3 mg/dL — ABNORMAL LOW (ref 8.9–10.3)
Chloride: 105 mmol/L (ref 98–111)
Creatinine, Ser: 0.74 mg/dL (ref 0.44–1.00)
GFR, Estimated: >60 mL/min (ref >60)
Glucose, Bld: 126 mg/dL — ABNORMAL HIGH (ref 70–99)
Potassium: 3.6 mmol/L (ref 3.5–5.1)
Sodium: 137 mmol/L (ref 135–145)

## 2021-07-24 LAB — GLUCOSE, CAPILLARY
Glucose-Capillary: 104 mg/dL — ABNORMAL HIGH (ref 70–99)
Glucose-Capillary: 122 mg/dL — ABNORMAL HIGH (ref 70–99)
Glucose-Capillary: 123 mg/dL — ABNORMAL HIGH (ref 70–99)
Glucose-Capillary: 133 mg/dL — ABNORMAL HIGH (ref 70–99)
Glucose-Capillary: 149 mg/dL — ABNORMAL HIGH (ref 70–99)
Glucose-Capillary: 151 mg/dL — ABNORMAL HIGH (ref 70–99)

## 2021-07-24 LAB — T4, FREE: Free T4: 1.8 ng/dL — ABNORMAL HIGH (ref 0.61–1.12)

## 2021-07-24 LAB — TSH: TSH: 0.785 u[IU]/mL (ref 0.350–4.500)

## 2021-07-24 LAB — MAGNESIUM: Magnesium: 1.9 mg/dL (ref 1.7–2.4)

## 2021-07-24 MED ORDER — ARIPIPRAZOLE 2 MG PO TABS
2.0000 mg | ORAL_TABLET | Freq: Every day | ORAL | Status: DC
Start: 2021-07-24 — End: 2021-07-24

## 2021-07-24 MED ORDER — APIXABAN 5 MG PO TABS
5.0000 mg | ORAL_TABLET | Freq: Two times a day (BID) | ORAL | Status: DC
Start: 2021-07-24 — End: 2021-07-27
  Administered 2021-07-24 – 2021-07-27 (×6): 5 mg via ORAL
  Filled 2021-07-24 (×6): qty 1

## 2021-07-24 NOTE — Progress Notes (Signed)
Tele called patient had a pause of 2.11. MD Tat made aware.

## 2021-07-24 NOTE — Assessment & Plan Note (Addendum)
Continue metoprolol succinate and diltiazem CD

## 2021-07-24 NOTE — Assessment & Plan Note (Signed)
Continue statin. 

## 2021-07-24 NOTE — Assessment & Plan Note (Signed)
Continue apixaban 

## 2021-07-24 NOTE — Hospital Course (Addendum)
76 year old female with a history of paroxysmal atrial fibrillation on apixaban, hypertension, orthostatic hypotension, anxiety/depression, diabetes mellitus type 2, hyperlipidemia, CVA, chronic pain, schizophrenia, GERD, chronic pain, and DVT use presenting with 4-day history of intractable nausea, vomiting, and upper abdominal pain.  The patient was in the ED on 07/18/2021.  Abdominal x-ray at that time was unremarkable.  The patient was able to eat without any vomiting and the patient was discharged back to [facility] in stable condition.  The patient returned to the ED on 07/21/2021 secondary to recurrent/intractable nausea and vomiting abdominal pain.  CT of the abdomen and pelvis was performed and showed debris in the distal esophagus with moderate distention concerning for a distal esophageal obstructing lesion.  There was also a large amount of stool noted in the rectosigmoid colon.  GI was consulted to assist with management. Last EGD 05/17/2016: Benign-appearing esophageal stenosis, small hiatal hernia, few gastric polyps, chronic gastritis, nonbleeding duodenal diverticulum.  Notably, the patient was recently admitted to the hospital from 05/18/2021 to 05/12/2021 secondary to pneumonia.  She was discharged back to her assisted living facility with Augmentin. EGD was performed on 07/23/2021 and showed food in the middle third and lower third of the esophagus.  The patient was subsequently debated during the procedure secondary to aspiration risk.  90% of the retained food was removed with 10% pushed into the stomach.  A distal esophageal stricture was noted.  However, the patient developed atrial fibrillation with hypotension during the procedure.  Therefore the rest of the procedure was terminated and dilatation was not performed.  She was subsequently started on a full liquid diet when she returned to the medical floor.

## 2021-07-24 NOTE — Assessment & Plan Note (Signed)
Continue Semglee Continue NovoLog sliding scale 03/22/2021 hemoglobin A1c 7.1 Repeat hemoglobin A1c

## 2021-07-24 NOTE — Assessment & Plan Note (Addendum)
Secondary to esophageal stricture -07/23/2021 EGD as discussed above Continuing full liquid diet -Patient continues to complain of difficulty with full liquids -Speech therapy consulted -Appreciate GI consult and follow-up -Continue IV pantoprazole twice daily -7/3 appears to be doing a little better, tolerating full liquids but still nauseous -7/3 speech recommends dys 3 with thin liquids

## 2021-07-24 NOTE — Progress Notes (Addendum)
Tele called patient had a pause of 2.04 at 1359, noted patient resting in bed at this time. MD Tat made aware.

## 2021-07-24 NOTE — Assessment & Plan Note (Signed)
Optimize electrolytes

## 2021-07-24 NOTE — Assessment & Plan Note (Addendum)
07/23/2021 EGD as discussed above -Continue full liquid diet -Continues to have dysphagia -Speech therapy consult -Appreciate GI consult and follow-up Continue IV pantoprazole twice daily

## 2021-07-24 NOTE — Progress Notes (Signed)
PROGRESS NOTE  [name] UUV:[id number] DOB: [birth year] DOA: 07/21/2021 PCP: Carrolyn Meiers, MD  Brief History:  76 year old female with a history of paroxysmal atrial fibrillation on apixaban, hypertension, orthostatic hypotension, anxiety/depression, diabetes mellitus type 2, hyperlipidemia, CVA, chronic pain, schizophrenia, GERD, chronic pain, and DVT use presenting with 4-day history of intractable nausea, vomiting, and upper abdominal pain.  The patient was in the ED on 07/18/2021.  Abdominal x-ray at that time was unremarkable.  The patient was able to eat without any vomiting and the patient was discharged back to [facility] in stable condition.  The patient returned to the ED on 07/21/2021 secondary to recurrent/intractable nausea and vomiting abdominal pain.  CT of the abdomen and pelvis was performed and showed debris in the distal esophagus with moderate distention concerning for a distal esophageal obstructing lesion.  There was also a large amount of stool noted in the rectosigmoid colon.  GI was consulted to assist with management. Last EGD 05/17/2016: Benign-appearing esophageal stenosis, small hiatal hernia, few gastric polyps, chronic gastritis, nonbleeding duodenal diverticulum.  Notably, the patient was recently admitted to the hospital from 05/18/2021 to 05/12/2021 secondary to pneumonia.  She was discharged back to her assisted living facility with Augmentin. EGD was performed on 07/23/2021 and showed food in the middle third and lower third of the esophagus.  The patient was subsequently debated during the procedure secondary to aspiration risk.  90% of the retained food was removed with 10% pushed into the stomach.  A distal esophageal stricture was noted.  However, the patient developed atrial fibrillation with hypotension during the procedure.  Therefore the rest of the procedure was terminated and dilatation was not performed.  She was subsequently started on a  full liquid diet when she returned to the medical floor.     Assessment and Plan: * Intractable nausea and vomiting Secondary to esophageal stricture -07/23/2021 EGD as discussed above Continuing full liquid diet -Patient continues to complain of difficulty with full liquids -Speech therapy consulted -Appreciate GI consult and follow-up -Continue IV pantoprazole twice daily  Stricture and stenosis of esophagus 07/23/2021 EGD as discussed above -Continue full liquid diet -Continues to have dysphagia -Speech therapy consult -Appreciate GI consult and follow-up Continue IV pantoprazole twice daily  Opioid dependence ([place]) PDMP reviewed Norco 10/325, #90, last refilled on 07/07/2021, 06/09/2018 Clonazepam 0.5 mg, #90, last filled on 07/07/2021, 06/13/2021.  Prolonged QT interval Optimize electrolytes  Paroxysmal atrial fibrillation (HCC) Continue apixaban Continue amiodarone Continue metoprolol succinate and diltiazem CD Rate controlled presently  Schizophrenia (HCC) Continue  Risperdal, Remeron, Cymbalta Continue prn clonazepam  Orthostasis Continue midodrine  Mixed hyperlipidemia Continue statin  DVT, lower extremity, recurrent (HCC) Continue apixaban  Essential hypertension Continue metoprolol succinate and diltiazem CD  Uncontrolled type 2 diabetes mellitus with hyperglycemia, with long-term current use of insulin (HCC) Continue Semglee Continue NovoLog sliding scale 03/22/2021 hemoglobin A1c 7.1 Repeat hemoglobin A1c      Family Communication:  no Family at bedside  Consultants:  GI  Code Status:  FULL /  DVT Prophylaxis:  apixaban   Procedures: As Listed in Progress Note Above  Antibiotics: None      Subjective: Patient continues to complain of nausea.  She has difficulty with full liquids.  There is no frank emesis.  She denies any fevers, chills, chest pain, shortness of breath.  She has epigastric pain.  There is no  diarrhea.  Objective: Vitals:   07/23/21  2100 07/24/21 0100 07/24/21 0700 07/24/21 0834  BP: 123/73 112/82 100/82 120/70  Pulse: 94 87 63 89  Resp: '20 20 18   '$ Temp: 98.4 F (36.9 C) 98.4 F (36.9 C) 98.1 F (36.7 C)   TempSrc: Oral Oral Oral   SpO2: 96% 96% 97%   Weight:      Height:        Intake/Output Summary (Last 24 hours) at 07/24/2021 1205 Last data filed at 07/23/2021 1800 Gross per 24 hour  Intake 113.41 ml  Output 800 ml  Net -686.59 ml   Weight change:  Exam:  General:  Pt is alert, follows commands appropriately, not in acute distress HEENT: No icterus, No thrush, No neck mass, [place]/AT Cardiovascular: RRR, S1/S2, no rubs, no gallops Respiratory: Bibasilar crackles.  No wheezing.  Good air movement Abdomen: Soft/+BS, epigastric tender, non distended, no guarding Extremities: No edema, No lymphangitis, No petechiae, No rashes, no synovitis   Data Reviewed: I have personally reviewed following labs and imaging studies Basic Metabolic Panel: Recent Labs  Lab 07/18/21 1502 07/21/21 2013 07/22/21 0530 07/23/21 0609  NA 135 137 138 137  K 3.9 4.1 4.2 3.5  CL 97* 99 102 105  CO2 32 '30 31 27  '$ GLUCOSE 153* 121* 162* 150*  BUN '15 15 12 11  '$ CREATININE 0.79 0.77 0.77 0.71  CALCIUM 8.9 9.0 8.9 8.5*  MG  --   --  2.1  --   PHOS  --   --  3.5  --    Liver Function Tests: Recent Labs  Lab 07/18/21 1502 07/21/21 2013 07/22/21 0530  AST 12* 17 12*  ALT '11 13 11  '$ ALKPHOS 76 75 64  BILITOT 0.5 0.5 0.4  PROT 7.7 8.0 6.9  ALBUMIN 3.8 4.1 3.4*   Recent Labs  Lab 07/18/21 1502 07/21/21 2013  LIPASE 22 22   No results for input(s): "AMMONIA" in the last 168 hours. Coagulation Profile: No results for input(s): "INR", "PROTIME" in the last 168 hours. CBC: Recent Labs  Lab 07/18/21 1502 07/21/21 2013 07/22/21 0530 07/23/21 0609  WBC 7.0 8.0 7.3 6.4  NEUTROABS 4.9  --   --  4.6  HGB 12.4 12.7 11.7* 11.9*  HCT 38.6 39.3 35.8* 37.4  MCV 87.9 87.1 86.9  85.8  PLT 280 278 237 241   Cardiac Enzymes: No results for input(s): "CKTOTAL", "CKMB", "CKMBINDEX", "TROPONINI" in the last 168 hours. BNP: Invalid input(s): "POCBNP" CBG: Recent Labs  Lab 07/23/21 2045 07/24/21 0010 07/24/21 0410 07/24/21 0723 07/24/21 1149  GLUCAP 119* 123* 133* 104* 151*   HbA1C: No results for input(s): "HGBA1C" in the last 72 hours. Urine analysis:    Component Value Date/Time   COLORURINE YELLOW 07/21/2021 2125   APPEARANCEUR HAZY (A) 07/21/2021 2125   APPEARANCEUR Cloudy (A) 05/01/2016 1525   LABSPEC 1.019 07/21/2021 2125   PHURINE 7.0 07/21/2021 2125   GLUCOSEU NEGATIVE 07/21/2021 2125   HGBUR NEGATIVE 07/21/2021 2125   BILIRUBINUR NEGATIVE 07/21/2021 2125   BILIRUBINUR Negative 05/01/2016 [place] 07/21/2021 2125   PROTEINUR NEGATIVE 07/21/2021 2125   UROBILINOGEN 0.2 09/14/2014 1703   NITRITE NEGATIVE 07/21/2021 2125   LEUKOCYTESUR NEGATIVE 07/21/2021 2125   Sepsis Labs: '@LABRCNTIP'$ (procalcitonin:4,lacticidven:4) )No results found for this or any previous visit (from the past 240 hour(s)).   Scheduled Meds:  amiodarone  200 mg Oral Daily   apixaban  5 mg Oral BID   atorvastatin  40 mg Oral QHS   busPIRone  7.5  mg Oral BID   diltiazem  120 mg Oral Daily   docusate sodium  100 mg Oral Daily   DULoxetine  120 mg Oral Daily   insulin aspart  0-9 Units Subcutaneous Q4H   insulin detemir  22 Units Subcutaneous Q2200   latanoprost  1 drop Both Eyes QHS   metoCLOPramide  5 mg Oral TID   metoprolol succinate  50 mg Oral Daily   midodrine  10 mg Oral TID   mirtazapine  30 mg Oral QHS   oxybutynin  5 mg Oral Daily   pantoprazole  40 mg Oral BID AC   polyethylene glycol  17 g Oral Nightly   risperiDONE  1 mg Oral Daily   risperiDONE  2 mg Oral QHS   senna  1 tablet Oral Daily   Continuous Infusions:  sodium chloride 20 mL/hr at 07/23/21 1219   lactated ringers Stopped (07/23/21 1219)    Procedures/Studies: CT  ABDOMEN PELVIS W CONTRAST  Result Date: 07/21/2021 CLINICAL DATA:  Epigastric pain. EXAM: CT ABDOMEN AND PELVIS WITH CONTRAST TECHNIQUE: Multidetector CT imaging of the abdomen and pelvis was performed using the standard protocol following bolus administration of intravenous contrast. RADIATION DOSE REDUCTION: This exam was performed according to the departmental dose-optimization program which includes automated exposure control, adjustment of the mA and/or kV according to patient size and/or use of iterative reconstruction technique. CONTRAST:  136m OMNIPAQUE IOHEXOL 300 MG/ML  SOLN COMPARISON:  CT abdomen and pelvis 04/23/2021 FINDINGS: Lower chest: There is a calcified granuloma in the right lower lobe, unchanged. No acute findings. Hepatobiliary: Hepatic hemangioma measuring up to 3.3 cm appears unchanged. No new liver lesions. Gallstones are again noted. There is no bile duct dilatation. Pancreas: Unremarkable. No pancreatic ductal dilatation or surrounding inflammatory changes. Spleen: Normal in size without focal abnormality. Adrenals/Urinary Tract: Adrenal glands are unremarkable. Kidneys are normal, without renal calculi, focal lesion, or hydronephrosis. Bladder is unremarkable. Stomach/Bowel: Appendix appears normal. No evidence of bowel wall thickening, distention, or inflammatory changes. There is a large amount of stool in the rectosigmoid colon. There is a small hiatal hernia. The remaining stomach is within normal limits. There is distention and debris in the distal esophagus. Vascular/Lymphatic: Aortic atherosclerosis. No enlarged abdominal or pelvic lymph nodes. Reproductive: Uterus and bilateral adnexa are unremarkable. Other: No abdominal wall hernia or abnormality. No abdominopelvic ascites. Musculoskeletal: Degenerative changes affect the spine. There is stable grade 1 anterolisthesis at L4-L5. IMPRESSION: 1. There is debris in the distal esophagus with moderate distention. This can be seen  with gastroesophageal reflux. Distal esophageal obstructing process not excluded. Please correlate clinically. 2. Small hiatal hernia. 3. Large amount of stool in the rectosigmoid colon. 4. Cholelithiasis. 5.  Aortic Atherosclerosis (ICD10-I70.0). Electronically Signed   By: ARonney AstersM.D.   On: 07/21/2021 22:39   DG Chest 2 View  Result Date: 07/21/2021 CLINICAL DATA:  Chest pain EXAM: CHEST - 2 VIEW COMPARISON:  07/18/2021, 07/07/2021 FINDINGS: Cardiomegaly. Increased opacity at the left lung base. Aortic atherosclerosis. No pneumothorax IMPRESSION: 1. Cardiomegaly with interim mild airspace disease at the peripheral left base, atelectasis versus pneumonia Electronically Signed   By: KDonavan FoilM.D.   On: 07/21/2021 21:18   DG Chest Port 1 View  Result Date: 07/18/2021 CLINICAL DATA:  Abnormal chest radiograph question pneumothorax EXAM: PORTABLE CHEST 1 VIEW COMPARISON:  Portable exam 1545 hours compared to 1451 hours FINDINGS: Enlargement of cardiac silhouette. Mediastinal contours and pulmonary vascularity normal. Atherosclerotic calcification aorta. Chronic bronchitic and  interstitial changes stable. No acute infiltrate, pleural effusion, or pneumothorax. Bones demineralized. IMPRESSION: Enlargement of cardiac silhouette. Chronic bronchitic and interstitial changes without acute infiltrate or pneumothorax. Aortic Atherosclerosis (ICD10-I70.0). Electronically Signed   By: Lavonia Dana M.D.   On: 07/18/2021 15:55   DG Abdomen Acute W/Chest  Result Date: 07/18/2021 CLINICAL DATA:  Vomiting EXAM: DG ABDOMEN ACUTE WITH 1 VIEW CHEST COMPARISON:  04/23/2021, 07/07/2021 FINDINGS: There is no evidence of dilated bowel loops or free intraperitoneal air. Moderate volume of stool throughout the colon. No radiopaque calculi or other significant radiographic abnormality is seen. Stable mild cardiomegaly. Aortic atherosclerosis. Presumed skin fold overlies the periphery of the left hemithorax. Chronic  scarring at the left costophrenic angle. Chronically coarsened interstitial markings bilaterally. IMPRESSION: 1. Presumed skin fold overlying the left hemithorax. Repeat frontal radiograph of the chest following patient repositioning is recommended to exclude the possibility of a small left pneumothorax. 2. Chronic lung changes with scarring at the left costophrenic angle. 3. Nonobstructive bowel gas pattern. Moderate volume of stool throughout the colon. Electronically Signed   By: Davina Poke D.O.   On: 07/18/2021 15:06   DG Chest 2 View  Result Date: 07/08/2021 CLINICAL DATA:  Follow-up pneumonia. EXAM: CHEST - 2 VIEW COMPARISON:  09/17/2021. FINDINGS: Cardiac silhouette is mildly enlarged. No mediastinal or hilar masses or evidence of adenopathy. Previously seen areas of lung opacity mostly resolved. Small area of opacity persists at the lateral left lung base on the frontal consistent with atelectasis or scarring. Remainder of the lungs is clear. No pleural effusion or pneumothorax. Skeletal structures are grossly intact. IMPRESSION: 1. No acute cardiopulmonary disease.  Resolved areas of pneumonia. Electronically Signed   By: Lajean Manes M.D.   On: 07/08/2021 15:09    Orson Eva, DO  [facility]  If 7PM-7AM, please contact night-coverage [email or web address] Password [email or web address] 07/24/2021, 12:05 PM   LOS: 3 days

## 2021-07-24 NOTE — Assessment & Plan Note (Addendum)
Continue amiodarone Continue apixaban Continue metoprolol succinate and diltiazem CD Currently rate controlled

## 2021-07-24 NOTE — Assessment & Plan Note (Signed)
PDMP reviewed Norco 10/325, #90, last refilled on 07/07/2021, 06/09/2018 Clonazepam 0.5 mg, #90, last filled on 07/07/2021, 06/13/2021.

## 2021-07-24 NOTE — Assessment & Plan Note (Signed)
Continue midodrine  

## 2021-07-24 NOTE — Progress Notes (Signed)
Subjective: Patient continues to complain of nausea.  No vomiting per RN who is bedside.  Having difficulty with liquid diet.  Objective: Vital signs in last 24 hours: Temp:  [98.1 F (36.7 C)-98.4 F (36.9 C)] 98.1 F (36.7 C) (07/02 0700) Pulse Rate:  [63-102] 89 (07/02 0834) Resp:  [18-27] 18 (07/02 0700) BP: (100-123)/(59-82) 120/70 (07/02 0834) SpO2:  [96 %-97 %] 97 % (07/02 0700) Last BM Date : 07/20/21 General:   Alert and oriented, pleasant Head:  Normocephalic and atraumatic. Eyes:  No icterus, sclera clear. Conjuctiva pink.  Abdomen:  Bowel sounds present, soft, non-tender, non-distended. No HSM or hernias noted. No rebound or guarding. No masses appreciated  Msk:  Symmetrical without gross deformities. Normal posture. Extremities:  Without clubbing or edema. Neurologic:  Alert and  oriented x4;  grossly normal neurologically. Skin:  Warm and dry, intact without significant lesions.  Cervical Nodes:  No significant cervical adenopathy. Psych:  Alert and cooperative. Normal mood and affect.  Intake/Output from previous day: 07/01 0701 - 07/02 0700 In: 113.4 [I.V.:113.4] Out: 800 [Urine:800] Intake/Output this shift: No intake/output data recorded.  Lab Results: Recent Labs    07/21/21 2013 07/22/21 0530 07/23/21 0609  WBC 8.0 7.3 6.4  HGB 12.7 11.7* 11.9*  HCT 39.3 35.8* 37.4  PLT 278 237 241   BMET Recent Labs    07/21/21 2013 07/22/21 0530 07/23/21 0609  NA 137 138 137  K 4.1 4.2 3.5  CL 99 102 105  CO2 '30 31 27  '$ GLUCOSE 121* 162* 150*  BUN '15 12 11  '$ CREATININE 0.77 0.77 0.71  CALCIUM 9.0 8.9 8.5*   LFT Recent Labs    07/21/21 2013 07/22/21 0530  PROT 8.0 6.9  ALBUMIN 4.1 3.4*  AST 17 12*  ALT 13 11  ALKPHOS 75 64  BILITOT 0.5 0.4   PT/INR No results for input(s): "LABPROT", "INR" in the last 72 hours. Hepatitis Panel No results for input(s): "HEPBSAG", "HCVAB", "HEPAIGM", "HEPBIGM" in the last 72 hours.   Studies/Results: No  results found.  Assessment: *Nausea and vomiting-intractable *Distal esophageal stricture *Esophagitis  Plan: Patient underwent EGD 07/23/2021 with large amount of food found in her middle and lower esophagus.  90% of food removed with Jabier Mutton net, remaining 10% pushed into stomach with endoscope.  Preparations were made for esophageal dilation however patient's cardiac rhythm acutely changed from A-fib to what appeared to be a flutter 4-5:1 and became hypotensive.  Endoscope removed and patient's rhythm flipped back to atrial fibrillation with improvement in her blood pressure.  Procedure was then stopped without dilation performed per anesthesia recommendations.  Patient with evidence of distal esophageal stricture though she likely has dysmotility as well.  Continue on full liquid diet.  IV PPI twice daily given esophagitis.  We will order MBE with speech to further evaluate.  Repeat EGD in 4 to 6 weeks with dilation.  GI continue to follow.  Elon Alas. Abbey Chatters, D.O. Gastroenterology and Hepatology [facility]   LOS: 3 days    07/24/2021, 10:22 AM

## 2021-07-24 NOTE — Assessment & Plan Note (Signed)
Continue apixaban Continue amiodarone Continue metoprolol succinate and diltiazem CD Rate controlled presently

## 2021-07-24 NOTE — Assessment & Plan Note (Addendum)
Continue  Risperdal, Remeron, Cymbalta Continue prn clonazepam

## 2021-07-25 ENCOUNTER — Inpatient Hospital Stay (HOSPITAL_COMMUNITY): Payer: Medicare Other

## 2021-07-25 ENCOUNTER — Encounter (HOSPITAL_COMMUNITY): Payer: Self-pay | Admitting: Internal Medicine

## 2021-07-25 DIAGNOSIS — F1120 Opioid dependence, uncomplicated: Secondary | ICD-10-CM | POA: Diagnosis not present

## 2021-07-25 DIAGNOSIS — T18128A Food in esophagus causing other injury, initial encounter: Secondary | ICD-10-CM | POA: Diagnosis not present

## 2021-07-25 DIAGNOSIS — I10 Essential (primary) hypertension: Secondary | ICD-10-CM | POA: Diagnosis not present

## 2021-07-25 DIAGNOSIS — R112 Nausea with vomiting, unspecified: Secondary | ICD-10-CM | POA: Diagnosis not present

## 2021-07-25 LAB — BASIC METABOLIC PANEL
Anion gap: 6 (ref 5–15)
BUN: 14 mg/dL (ref 8–23)
CO2: 28 mmol/L (ref 22–32)
Calcium: 8.1 mg/dL — ABNORMAL LOW (ref 8.9–10.3)
Chloride: 104 mmol/L (ref 98–111)
Creatinine, Ser: 0.64 mg/dL (ref 0.44–1.00)
GFR, Estimated: >60 mL/min (ref >60)
Glucose, Bld: 71 mg/dL (ref 70–99)
Potassium: 3.4 mmol/L — ABNORMAL LOW (ref 3.5–5.1)
Sodium: 138 mmol/L (ref 135–145)

## 2021-07-25 LAB — GLUCOSE, CAPILLARY
Glucose-Capillary: 120 mg/dL — ABNORMAL HIGH (ref 70–99)
Glucose-Capillary: 74 mg/dL (ref 70–99)
Glucose-Capillary: 75 mg/dL (ref 70–99)
Glucose-Capillary: 214 mg/dL — ABNORMAL HIGH (ref 70–99)
Glucose-Capillary: 115 mg/dL — ABNORMAL HIGH (ref 70–99)
Glucose-Capillary: 193 mg/dL — ABNORMAL HIGH (ref 70–99)

## 2021-07-25 LAB — CBC
HCT: 34 % — ABNORMAL LOW (ref 36.0–46.0)
Hemoglobin: 11 g/dL — ABNORMAL LOW (ref 12.0–15.0)
MCH: 28.3 pg (ref 26.0–34.0)
MCHC: 32.4 g/dL (ref 30.0–36.0)
MCV: 87.4 fL (ref 80.0–100.0)
Platelets: 206 10*3/uL (ref 150–400)
RBC: 3.89 MIL/uL (ref 3.87–5.11)
RDW: 14.9 % (ref 11.5–15.5)
WBC: 6.6 10*3/uL (ref 4.0–10.5)
nRBC: 0 % (ref 0.0–0.2)

## 2021-07-25 LAB — MAGNESIUM: Magnesium: 2 mg/dL (ref 1.7–2.4)

## 2021-07-25 MED ORDER — INSULIN DETEMIR 100 UNIT/ML ~~LOC~~ SOLN
18.0000 [IU] | Freq: Every day | SUBCUTANEOUS | Status: DC
Start: 2021-07-25 — End: 2021-07-27
  Administered 2021-07-25 – 2021-07-26 (×2): 18 [IU] via SUBCUTANEOUS
  Filled 2021-07-25 (×3): qty 0.18

## 2021-07-25 MED ORDER — METOPROLOL SUCCINATE ER 25 MG PO TB24
25.0000 mg | ORAL_TABLET | Freq: Every day | ORAL | Status: DC
  Administered 2021-07-25 – 2021-07-27 (×3): 25 mg via ORAL
  Filled 2021-07-25 (×3): qty 1

## 2021-07-25 MED ORDER — POTASSIUM CHLORIDE 10 MEQ/100ML IV SOLN
10.0000 meq | INTRAVENOUS | Status: AC
  Administered 2021-07-25 (×2): 10 meq via INTRAVENOUS
  Filled 2021-07-25: qty 100

## 2021-07-25 NOTE — Progress Notes (Signed)
Gastroenterology Progress Note    Primary Care Physician:  Carrolyn Meiers, MD Primary Gastroenterologist:  Dr. Abbey Chatters  Patient ID: [name]; [id number]; 12/10/1945    Subjective   Nauseated but no vomiting. Ate pudding for breakfast and tolerated this.    Objective   Vital signs in last 24 hours Temp:  [98.1 F (36.7 C)-98.6 F (37 C)] 98.6 F (37 C) (07/03 0553) Pulse Rate:  [82-89] 89 (07/03 0553) Resp:  [16-19] 16 (07/03 0553) BP: (111)/(58-73) 111/58 (07/03 0553) SpO2:  [97 %-99 %] 97 % (07/03 0553) Last BM Date : 07/20/21  Physical Exam General:   Alert and oriented, pleasant Head:  Normocephalic and atraumatic. Abdomen:  Bowel sounds present, soft, non-tender, non-distended.  Neurologic:  Alert and  oriented x4   Intake/Output from previous day: 07/02 0701 - 07/03 0700 In: 720 [P.O.:720] Out: 450 [Urine:450] Intake/Output this shift: No intake/output data recorded.  Lab Results  Recent Labs    07/23/21 0609 07/25/21 0617  WBC 6.4 6.6  HGB 11.9* 11.0*  HCT 37.4 34.0*  PLT 241 206   BMET Recent Labs    07/23/21 0609 07/24/21 1509 07/25/21 0617  NA 137 137 138  K 3.5 3.6 3.4*  CL 105 105 104  CO2 '27 28 28  '$ GLUCOSE 150* 126* 71  BUN '11 15 14  '$ CREATININE 0.71 0.74 0.64  CALCIUM 8.5* 8.3* 8.1*    Studies/Results CT ABDOMEN PELVIS W CONTRAST  Result Date: 07/21/2021 CLINICAL DATA:  Epigastric pain. EXAM: CT ABDOMEN AND PELVIS WITH CONTRAST TECHNIQUE: Multidetector CT imaging of the abdomen and pelvis was performed using the standard protocol following bolus administration of intravenous contrast. RADIATION DOSE REDUCTION: This exam was performed according to the departmental dose-optimization program which includes automated exposure control, adjustment of the mA and/or kV according to patient size and/or use of iterative reconstruction technique. CONTRAST:  120m OMNIPAQUE IOHEXOL 300 MG/ML  SOLN COMPARISON:  CT abdomen and  pelvis 04/23/2021 FINDINGS: Lower chest: There is a calcified granuloma in the right lower lobe, unchanged. No acute findings. Hepatobiliary: Hepatic hemangioma measuring up to 3.3 cm appears unchanged. No new liver lesions. Gallstones are again noted. There is no bile duct dilatation. Pancreas: Unremarkable. No pancreatic ductal dilatation or surrounding inflammatory changes. Spleen: Normal in size without focal abnormality. Adrenals/Urinary Tract: Adrenal glands are unremarkable. Kidneys are normal, without renal calculi, focal lesion, or hydronephrosis. Bladder is unremarkable. Stomach/Bowel: Appendix appears normal. No evidence of bowel wall thickening, distention, or inflammatory changes. There is a large amount of stool in the rectosigmoid colon. There is a small hiatal hernia. The remaining stomach is within normal limits. There is distention and debris in the distal esophagus. Vascular/Lymphatic: Aortic atherosclerosis. No enlarged abdominal or pelvic lymph nodes. Reproductive: Uterus and bilateral adnexa are unremarkable. Other: No abdominal wall hernia or abnormality. No abdominopelvic ascites. Musculoskeletal: Degenerative changes affect the spine. There is stable grade 1 anterolisthesis at L4-L5. IMPRESSION: 1. There is debris in the distal esophagus with moderate distention. This can be seen with gastroesophageal reflux. Distal esophageal obstructing process not excluded. Please correlate clinically. 2. Small hiatal hernia. 3. Large amount of stool in the rectosigmoid colon. 4. Cholelithiasis. 5.  Aortic Atherosclerosis (ICD10-I70.0). Electronically Signed   By: ARonney AstersM.D.   On: 07/21/2021 22:39   DG Chest 2 View  Result Date: 07/21/2021 CLINICAL DATA:  Chest pain EXAM: CHEST - 2 VIEW COMPARISON:  07/18/2021, 07/07/2021 FINDINGS: Cardiomegaly. Increased opacity at the left lung base. Aortic  atherosclerosis. No pneumothorax IMPRESSION: 1. Cardiomegaly with interim mild airspace disease at the  peripheral left base, atelectasis versus pneumonia Electronically Signed   By: Donavan Foil M.D.   On: 07/21/2021 21:18   DG Chest Port 1 View  Result Date: 07/18/2021 CLINICAL DATA:  Abnormal chest radiograph question pneumothorax EXAM: PORTABLE CHEST 1 VIEW COMPARISON:  Portable exam 1545 hours compared to 1451 hours FINDINGS: Enlargement of cardiac silhouette. Mediastinal contours and pulmonary vascularity normal. Atherosclerotic calcification aorta. Chronic bronchitic and interstitial changes stable. No acute infiltrate, pleural effusion, or pneumothorax. Bones demineralized. IMPRESSION: Enlargement of cardiac silhouette. Chronic bronchitic and interstitial changes without acute infiltrate or pneumothorax. Aortic Atherosclerosis (ICD10-I70.0). Electronically Signed   By: Lavonia Dana M.D.   On: 07/18/2021 15:55   DG Abdomen Acute W/Chest  Result Date: 07/18/2021 CLINICAL DATA:  Vomiting EXAM: DG ABDOMEN ACUTE WITH 1 VIEW CHEST COMPARISON:  04/23/2021, 07/07/2021 FINDINGS: There is no evidence of dilated bowel loops or free intraperitoneal air. Moderate volume of stool throughout the colon. No radiopaque calculi or other significant radiographic abnormality is seen. Stable mild cardiomegaly. Aortic atherosclerosis. Presumed skin fold overlies the periphery of the left hemithorax. Chronic scarring at the left costophrenic angle. Chronically coarsened interstitial markings bilaterally. IMPRESSION: 1. Presumed skin fold overlying the left hemithorax. Repeat frontal radiograph of the chest following patient repositioning is recommended to exclude the possibility of a small left pneumothorax. 2. Chronic lung changes with scarring at the left costophrenic angle. 3. Nonobstructive bowel gas pattern. Moderate volume of stool throughout the colon. Electronically Signed   By: Davina Poke D.O.   On: 07/18/2021 15:06   DG Chest 2 View  Result Date: 07/08/2021 CLINICAL DATA:  Follow-up pneumonia. EXAM: CHEST  - 2 VIEW COMPARISON:  09/17/2021. FINDINGS: Cardiac silhouette is mildly enlarged. No mediastinal or hilar masses or evidence of adenopathy. Previously seen areas of lung opacity mostly resolved. Small area of opacity persists at the lateral left lung base on the frontal consistent with atelectasis or scarring. Remainder of the lungs is clear. No pleural effusion or pneumothorax. Skeletal structures are grossly intact. IMPRESSION: 1. No acute cardiopulmonary disease.  Resolved areas of pneumonia. Electronically Signed   By: Lajean Manes M.D.   On: 07/08/2021 15:09    Assessment  76 y.o. female with a history of afib, DVTs, chronically on anticoagulation, GERD, chronic constipation, and nausea dating back to at least Oct 2022, admitted with intractable N/V and abdominal pain. CT this admission with debris in distal esophagus and underwent EGD 07/23/2021 with food in middle and lower third of esophagus s/p removal but unable to dilate as patient changed from afib to aflutter and became hypotensive.   Nausea continues but tolerated pudding this morning. Speech pathology consult requested. She will need EGD/dilation as outpatient   Chronic nausea: query underlying gastroparesis. Continue with Reglan as long as tolerating without side effects.      Plan / Recommendations  Speech Pathology Continue full liquids  Continue PPI BID and Reglan QID Will need EGD/dilation as outpatient    LOS: 4 days    07/25/2021, 9:14 AM  Annitta Needs, PhD, ANP-BC [facility]

## 2021-07-25 NOTE — Care Management Important Message (Signed)
Important Message  Patient Details  Name: [name] MRN: [id number] Date of Birth: 1945-02-23   Medicare Important Message Given:  Yes     [name] 07/25/2021, 11:47 AM

## 2021-07-25 NOTE — Evaluation (Signed)
Clinical/Bedside Swallow Evaluation Patient Details  Name: [name] MRN: [id number] Date of Birth: 1945-06-17  Today's Date: 07/25/2021 Time: SLP Start Time (ACUTE ONLY): 66 SLP Stop Time (ACUTE ONLY): 1426 SLP Time Calculation (min) (ACUTE ONLY): 26 min  Past Medical History:  Past Medical History:  Diagnosis Date   Anxiety    Arthritis    Atrial fibrillation (HCC)    Atrial flutter ([place])    Collagen vascular disease ([place])    COVID-19 virus infection    COVID-19+ approx 01/24/19; asymptomatic course with full recovery   Dependence on wheelchair    pivot/transfers   Depression    History of psychosis and previous suicide attempt   DVT, lower extremity, recurrent ([place])    Long-term Coumadin per Dr. Legrand Rams   Essential hypertension    GERD (gastroesophageal reflux disease)    Hemiplegia ([place]) 2010   Left side   History of stroke    Acute infarct and right cerebral white matter small vessel disease 12/10   Leg DVT (deep venous thromboembolism), acute ([place]) 2006   Orthostatic hypotension    Schizophrenia ([place])    Stroke ([place])    left sided weakness   Type 2 diabetes mellitus ([place])    Past Surgical History:  Past Surgical History:  Procedure Laterality Date   BACK SURGERY     BIOPSY N/A 11/24/2014   Procedure: BIOPSY;  Surgeon: Danie Binder, MD;  Location: [facility];  Service: Endoscopy;  Laterality: N/A;   BIOPSY  05/17/2016   Procedure: BIOPSY;  Surgeon: Danie Binder, MD;  Location: [facility];  Service: Endoscopy;;  gastric biopsy   COLONOSCOPY WITH PROPOFOL N/A 11/24/2014   Dr. Rudie Meyer polyps removed/moderate sized internal hemorrhoids, tubular adenomas. Next surveillance in 3 years   ESOPHAGEAL DILATION N/A 07/23/2021   Procedure: ESOPHAGEAL DILATION;  Surgeon: Eloise Harman, DO;  [facility]: [facility];  Service: Endoscopy;  Laterality: N/A;   ESOPHAGOGASTRODUODENOSCOPY (EGD) WITH PROPOFOL N/A 11/24/2014   Dr. Clayburn Pert HH/patent stricture at the  gastroesophageal junction, mild non-erosive gastritis, path negative for H.pylori or celiac sprue   ESOPHAGOGASTRODUODENOSCOPY (EGD) WITH PROPOFOL N/A 05/17/2016   Procedure: ESOPHAGOGASTRODUODENOSCOPY (EGD) WITH PROPOFOL;  Surgeon: Danie Binder, MD;  [facility];  Service: Endoscopy;  Laterality: N/A;  12:45pm   ESOPHAGOGASTRODUODENOSCOPY (EGD) WITH PROPOFOL N/A 07/23/2021   Procedure: ESOPHAGOGASTRODUODENOSCOPY (EGD) WITH PROPOFOL;  Surgeon: Eloise Harman, DO;  [facility];  Service: Endoscopy;  Laterality: N/A;   GIVENS CAPSULE STUDY N/A 12/11/2014   MULTILPLE EROSION IN the stomach WITH ACTIVE OOZING. OCCASIONAL EROSIONS AND RARE ULCER SEEN IN PROXIMAL SMALL BOWEL . No masses or AVMs SEEN. NO OLD BLOOD OR FRESH BLOOD SEEN.    POLYPECTOMY N/A 11/24/2014   Procedure: POLYPECTOMY;  Surgeon: Danie Binder, MD;  Location: [facility];  Service: Endoscopy;  Laterality: N/A;   SAVORY DILATION N/A 05/17/2016   Procedure: SAVORY DILATION;  Surgeon: Danie Binder, MD;  [facility];  Service: Endoscopy;  Laterality: N/A;   HPI:  76 year old female with a history of paroxysmal atrial fibrillation on apixaban, hypertension, orthostatic hypotension, anxiety/depression, diabetes mellitus type 2, hyperlipidemia, CVA, chronic pain, schizophrenia, GERD, chronic pain, and DVT use presenting with 4-day history of intractable nausea, vomiting, and upper abdominal pain.  The patient was in the ED on 07/18/2021.  Abdominal x-ray at that time was unremarkable.  The patient was able to eat without any vomiting and the patient was discharged back to  [facility] in stable condition.  The patient returned to the ED on 07/21/2021 secondary to recurrent/intractable nausea and vomiting abdominal pain.  CT of the abdomen and pelvis was performed and showed debris in the distal esophagus with moderate distention concerning for a distal esophageal obstructing lesion.  There was also a large amount of  stool noted in the rectosigmoid colon.  GI was consulted to assist with management. EGD was performed on 07/23/2021 and showed food in the middle third and lower third of the esophagus.  The patient was subsequently debated during the procedure secondary to aspiration risk.  90% of the retained food was removed with 10% pushed into the stomach.  A distal esophageal stricture was noted.  However, the patient developed atrial fibrillation with hypotension during the procedure.  Therefore the rest of the procedure was terminated and dilatation was not performed.  She was subsequently started on a full liquid diet when she returned to the medical floor. BSE requested.    Assessment / Plan / Recommendation  Clinical Impression  Consult recieved for modified barium swallow study, however SLP completed BSE first to determine appropriate needs. Oral motor examination is WNL. Pt is edentulous, but reportedly has dentures at [facility] facility. Pt assessed with sequential straw sips thin water, puree, and cracker. Pt with limited intake of regular textures, but overall shows no overt signs or symptoms of reduced airway protection and no reports of globus in the pharynx. Pt did report "nausea" a few minutes after the limited po trials, but was redirected in conversation and no further reports. Given the recent findings on EGD, constipation from medications, and no signs of reduced airway protection, suspect esophageal dysphagia and Pt would likely benefit from barium pill esophagram versus modified barium swallow study. Results and recommendations reviewed with GI and they will order esophagram. OK from oropharyngeal standpoint for mechanical soft and thin liquids with aspiration and reflux precautions, however will defer to MD and follow per results of barium swallow/esophagram.  SLP Visit Diagnosis: Dysphagia, unspecified (R13.10)    Aspiration Risk  Risk for inadequate nutrition/hydration    Diet Recommendation  Dysphagia 3 (Mech soft);Thin liquid   Liquid Administration via: Cup;Straw Medication Administration: Whole meds with puree Supervision: Patient able to self feed;Intermittent supervision to cue for compensatory strategies Compensations: Slow rate;Small sips/bites Postural Changes: Seated upright at 90 degrees;Remain upright for at least 30 minutes after po intake    Other  Recommendations Recommended Consults: Consider esophageal assessment Oral Care Recommendations: Oral care BID Other Recommendations: Clarify dietary restrictions    Recommendations for follow up therapy are one component of a multi-disciplinary discharge planning process, led by the attending physician.  Recommendations may be updated based on patient status, additional functional criteria and insurance authorization.  Follow up Recommendations No SLP follow up      Assistance Recommended at Discharge Intermittent Supervision/Assistance  Functional Status Assessment Patient has had a recent decline in their functional status and demonstrates the ability to make significant improvements in function in a reasonable and predictable amount of time.  Frequency and Duration min 2x/week  1 week       Prognosis Prognosis for Safe Diet Advancement: Fair Barriers to Reach Goals:  (susptected severity of esophageal deficits)      Swallow Study   General Date of Onset: 07/21/21 HPI: 76 year old female with a history of paroxysmal atrial fibrillation on apixaban, hypertension, orthostatic hypotension, anxiety/depression, diabetes mellitus type 2, hyperlipidemia, CVA, chronic pain, schizophrenia, GERD, chronic pain, and DVT  use presenting with 4-day history of intractable nausea, vomiting, and upper abdominal pain.  The patient was in the ED on 07/18/2021.  Abdominal x-ray at that time was unremarkable.  The patient was able to eat without any vomiting and the patient was discharged back to [facility] in stable condition.  The  patient returned to the ED on 07/21/2021 secondary to recurrent/intractable nausea and vomiting abdominal pain.  CT of the abdomen and pelvis was performed and showed debris in the distal esophagus with moderate distention concerning for a distal esophageal obstructing lesion.  There was also a large amount of stool noted in the rectosigmoid colon.  GI was consulted to assist with management. EGD was performed on 07/23/2021 and showed food in the middle third and lower third of the esophagus.  The patient was subsequently debated during the procedure secondary to aspiration risk.  90% of the retained food was removed with 10% pushed into the stomach.  A distal esophageal stricture was noted.  However, the patient developed atrial fibrillation with hypotension during the procedure.  Therefore the rest of the procedure was terminated and dilatation was not performed.  She was subsequently started on a full liquid diet when she returned to the medical floor. BSE requested. Type of Study: Bedside Swallow Evaluation Diet Prior to this Study:  (full liquids) Temperature Spikes Noted: No Respiratory Status: Room air History of Recent Intubation: No Behavior/Cognition: Alert;Cooperative;Pleasant mood Oral Cavity Assessment: Within Functional Limits Oral Care Completed by SLP: Recent completion by staff Oral Cavity - Dentition: Edentulous Vision: Functional for self-feeding Self-Feeding Abilities: Able to feed self Patient Positioning: Upright in bed Baseline Vocal Quality: Normal Volitional Cough: Strong Volitional Swallow: Able to elicit    Oral/Motor/Sensory Function Overall Oral Motor/Sensory Function: Within functional limits   Ice Chips Ice chips: Within functional limits Presentation: Spoon   Thin Liquid Thin Liquid: Within functional limits Presentation: Self Fed;Straw    Nectar Thick Nectar Thick Liquid: Not tested   Honey Thick Honey Thick Liquid: Not tested   Puree Puree: Within functional  limits Presentation: Spoon   Solid     Solid: Within functional limits Presentation: Self Fed     Thank you,  Genene Churn, [place]  Stefana Lodico 07/25/2021,3:13 PM

## 2021-07-25 NOTE — Progress Notes (Signed)
PROGRESS NOTE  [name] GHW:[id number] DOB: [birth year] DOA: 07/21/2021 PCP: Carrolyn Meiers, MD  Brief History:  76 year old female with a history of paroxysmal atrial fibrillation on apixaban, hypertension, orthostatic hypotension, anxiety/depression, diabetes mellitus type 2, hyperlipidemia, CVA, chronic pain, schizophrenia, GERD, chronic pain, and DVT use presenting with 4-day history of intractable nausea, vomiting, and upper abdominal pain.  The patient was in the ED on 07/18/2021.  Abdominal x-ray at that time was unremarkable.  The patient was able to eat without any vomiting and the patient was discharged back to [facility] in stable condition.  The patient returned to the ED on 07/21/2021 secondary to recurrent/intractable nausea and vomiting abdominal pain.  CT of the abdomen and pelvis was performed and showed debris in the distal esophagus with moderate distention concerning for a distal esophageal obstructing lesion.  There was also a large amount of stool noted in the rectosigmoid colon.  GI was consulted to assist with management. Last EGD 05/17/2016: Benign-appearing esophageal stenosis, small hiatal hernia, few gastric polyps, chronic gastritis, nonbleeding duodenal diverticulum.  Notably, the patient was recently admitted to the hospital from 05/18/2021 to 05/12/2021 secondary to pneumonia.  She was discharged back to her assisted living facility with Augmentin. EGD was performed on 07/23/2021 and showed food in the middle third and lower third of the esophagus.  The patient was subsequently debated during the procedure secondary to aspiration risk.  90% of the retained food was removed with 10% pushed into the stomach.  A distal esophageal stricture was noted.  However, the patient developed atrial fibrillation with hypotension during the procedure.  Therefore the rest of the procedure was terminated and dilatation was not performed.  She was subsequently started on a  full liquid diet when she returned to the medical floor.     Assessment and Plan: * Intractable nausea and vomiting Secondary to esophageal stricture -07/23/2021 EGD as discussed above Continuing full liquid diet -Patient continues to complain of difficulty with full liquids -Speech therapy consulted -Appreciate GI consult and follow-up -Continue IV pantoprazole twice daily -7/3 appears to be doing a little better, tolerating full liquids but still nauseous -7/3 speech recommends dys 3 with thin liquids  Stricture and stenosis of esophagus 07/23/2021 EGD as discussed above -Continue full liquid diet -Continues to have dysphagia -Speech therapy consult -Appreciate GI consult and follow-up Continue IV pantoprazole twice daily  Opioid dependence ([place]) PDMP reviewed Norco 10/325, #90, last refilled on 07/07/2021, 06/09/2018 Clonazepam 0.5 mg, #90, last filled on 07/07/2021, 06/13/2021.  Prolonged QT interval Optimize electrolytes  Paroxysmal atrial fibrillation (HCC) Continue apixaban Continue amiodarone Continue metoprolol succinate and diltiazem CD Rate controlled presently  Schizophrenia (HCC) Continue  Risperdal, Remeron, Cymbalta Continue prn clonazepam  Orthostasis Continue midodrine  Mixed hyperlipidemia Continue statin  DVT, lower extremity, recurrent (HCC) Continue apixaban  Essential hypertension Continue metoprolol succinate and diltiazem CD  Uncontrolled type 2 diabetes mellitus with hyperglycemia, with long-term current use of insulin (HCC) Continue Semglee Continue NovoLog sliding scale 03/22/2021 hemoglobin A1c 7.1 Repeat hemoglobin A1c    Family Communication:  no Family at bedside   Consultants:  GI   Code Status:  FULL    DVT Prophylaxis:  apixaban     Procedures: As Listed in Progress Note Above   Antibiotics: None         Subjective: Tolerating full liquid, but complains of nausea.  Denies f/c, cp, sob, abd  pain  Objective: Vitals:  07/24/21 1451 07/25/21 0553 07/25/21 0950 07/25/21 1235  BP: 111/73 (!) 111/58 (!) 115/59 123/81  Pulse: 82 89 70 77  Resp: '19 16  18  '$ Temp: 98.1 F (36.7 C) 98.6 F (37 C)  97.8 F (36.6 C)  TempSrc: Oral   Oral  SpO2: 99% 97%  98%  Weight:      Height:        Intake/Output Summary (Last 24 hours) at 07/25/2021 1733 Last data filed at 07/25/2021 1439 Gross per 24 hour  Intake 1200 ml  Output 1200 ml  Net 0 ml   Weight change:  Exam:  General:  Pt is alert, follows commands appropriately, not in acute distress HEENT: No icterus, No thrush, No neck mass, [place]/AT Cardiovascular: RRR, S1/S2, no rubs, no gallops Respiratory: bibasilar crackles. No wheeze Abdomen: Soft/+BS, non tender, non distended, no guarding Extremities: No edema, No lymphangitis, No petechiae, No rashes, no synovitis   Data Reviewed: I have personally reviewed following labs and imaging studies Basic Metabolic Panel: Recent Labs  Lab 07/21/21 2013 07/22/21 0530 07/23/21 0609 07/24/21 1509 07/25/21 0617  NA 137 138 137 137 138  K 4.1 4.2 3.5 3.6 3.4*  CL 99 102 105 105 104  CO2 '30 31 27 28 28  '$ GLUCOSE 121* 162* 150* 126* 71  BUN '15 12 11 15 14  '$ CREATININE 0.77 0.77 0.71 0.74 0.64  CALCIUM 9.0 8.9 8.5* 8.3* 8.1*  MG  --  2.1  --  1.9 2.0  PHOS  --  3.5  --   --   --    Liver Function Tests: Recent Labs  Lab 07/21/21 2013 07/22/21 0530  AST 17 12*  ALT 13 11  ALKPHOS 75 64  BILITOT 0.5 0.4  PROT 8.0 6.9  ALBUMIN 4.1 3.4*   Recent Labs  Lab 07/21/21 2013  LIPASE 22   No results for input(s): "AMMONIA" in the last 168 hours. Coagulation Profile: No results for input(s): "INR", "PROTIME" in the last 168 hours. CBC: Recent Labs  Lab 07/21/21 2013 07/22/21 0530 07/23/21 0609 07/25/21 0617  WBC 8.0 7.3 6.4 6.6  NEUTROABS  --   --  4.6  --   HGB 12.7 11.7* 11.9* 11.0*  HCT 39.3 35.8* 37.4 34.0*  MCV 87.1 86.9 85.8 87.4  PLT 278 237 241 206   Cardiac  Enzymes: No results for input(s): "CKTOTAL", "CKMB", "CKMBINDEX", "TROPONINI" in the last 168 hours. BNP: Invalid input(s): "POCBNP" CBG: Recent Labs  Lab 07/25/21 0107 07/25/21 0427 07/25/21 0705 07/25/21 1112 07/25/21 1541  GLUCAP 120* 74 75 214* 115*   HbA1C: No results for input(s): "HGBA1C" in the last 72 hours. Urine analysis:    Component Value Date/Time   COLORURINE YELLOW 07/21/2021 2125   APPEARANCEUR HAZY (A) 07/21/2021 2125   APPEARANCEUR Cloudy (A) 05/01/2016 1525   LABSPEC 1.019 07/21/2021 2125   PHURINE 7.0 07/21/2021 2125   GLUCOSEU NEGATIVE 07/21/2021 2125   HGBUR NEGATIVE 07/21/2021 2125   BILIRUBINUR NEGATIVE 07/21/2021 2125   BILIRUBINUR Negative 05/01/2016 [place] 07/21/2021 2125   PROTEINUR NEGATIVE 07/21/2021 2125   UROBILINOGEN 0.2 09/14/2014 1703   NITRITE NEGATIVE 07/21/2021 2125   LEUKOCYTESUR NEGATIVE 07/21/2021 2125   Sepsis Labs: '@LABRCNTIP'$ (procalcitonin:4,lacticidven:4) )No results found for this or any previous visit (from the past 240 hour(s)).   Scheduled Meds:  amiodarone  200 mg Oral Daily   apixaban  5 mg Oral BID   atorvastatin  40 mg Oral QHS   busPIRone  7.5 mg Oral BID   diltiazem  120 mg Oral Daily   docusate sodium  100 mg Oral Daily   DULoxetine  120 mg Oral Daily   insulin aspart  0-9 Units Subcutaneous Q4H   insulin detemir  18 Units Subcutaneous Q2200   latanoprost  1 drop Both Eyes QHS   metoCLOPramide  5 mg Oral TID   metoprolol succinate  25 mg Oral Daily   midodrine  10 mg Oral TID   mirtazapine  30 mg Oral QHS   oxybutynin  5 mg Oral Daily   pantoprazole  40 mg Oral BID AC   polyethylene glycol  17 g Oral Nightly   risperiDONE  1 mg Oral Daily   risperiDONE  2 mg Oral QHS   senna  1 tablet Oral Daily   Continuous Infusions:  sodium chloride 20 mL/hr at 07/23/21 1219   lactated ringers Stopped (07/23/21 1219)    Procedures/Studies: DG ESOPHAGUS W SINGLE CM (SOL OR THIN BA)  Result  Date: 07/25/2021 EXAM: ESOPHAGUS/BARIUM SWALLOW/TABLET STUDY TECHNIQUE: Initial scout AP supine abdominal image obtained to insure adequate colon cleansing. Barium was introduced into the colon in a retrograde fashion and refluxed from the rectum to the cecum. Spot images of the colon followed by overhead radiographs were obtained. FLUOROSCOPY: Radiation Exposure Index (as provided by the fluoroscopic device): 9.5 mGy Kerma COMPARISON:  CT abdomen 07/21/2021 FINDINGS: The patient is infirm and not able to assume standing position or multiple projections typically performed in a esophagram. The entire exam was performed with patient in the LPO position. The patient was not able to draw fluid from a straw. Because of this, we used a blunt tip syringe to inject boluses of about 5 cc of barium into the patient's mouth for individual swallows. She was able to swallow on command in this setting. Disruption of primary peristaltic waves in the mid esophagus noted on all swallows. Non propulsive tertiary contractions noted in the mid and distal esophagus. With patient in the supine position, even after several swallows, contrast did not make its way down into the stomach. Accordingly, tilted the patient up about 10 degrees to recruit gravity. Subsequently, contrast with slowly percolated into the stomach. In this setting I was not able to distend the distal esophagus more than about 0.8 cm. A 13 mm barium tablet extended to the distal esophagus but failed to pass even with dry swallows and with the patient being tilted up 10 degrees. IMPRESSION: 1. Nonspecific esophageal dysmotility disorder, with termination of primary peristaltic waves in the mid esophagus, and tertiary contractions in the esophagus. A 13 mm barium tablet made its way to the lower thoracic esophagus but did not further proceed due to discoordinated tertiary contractions. 2. I was not able to distend the distal esophagus beyond about 7 mm. This may well  simply be due to dysmotility but I cannot exclude a stricture. 3. Ms. Blumberg was not able to draw fluid from a straw. We used a blunt tip syringe to inject boluses of about 5 cc of barium for individual swallows. Today's examination was performed in the LPO position. Electronically Signed   By: Van Clines M.D.   On: 07/25/2021 15:46   CT ABDOMEN PELVIS W CONTRAST  Result Date: 07/21/2021 CLINICAL DATA:  Epigastric pain. EXAM: CT ABDOMEN AND PELVIS WITH CONTRAST TECHNIQUE: Multidetector CT imaging of the abdomen and pelvis was performed using the standard protocol following bolus administration of intravenous contrast. RADIATION DOSE REDUCTION: This exam was  performed according to the departmental dose-optimization program which includes automated exposure control, adjustment of the mA and/or kV according to patient size and/or use of iterative reconstruction technique. CONTRAST:  137m OMNIPAQUE IOHEXOL 300 MG/ML  SOLN COMPARISON:  CT abdomen and pelvis 04/23/2021 FINDINGS: Lower chest: There is a calcified granuloma in the right lower lobe, unchanged. No acute findings. Hepatobiliary: Hepatic hemangioma measuring up to 3.3 cm appears unchanged. No new liver lesions. Gallstones are again noted. There is no bile duct dilatation. Pancreas: Unremarkable. No pancreatic ductal dilatation or surrounding inflammatory changes. Spleen: Normal in size without focal abnormality. Adrenals/Urinary Tract: Adrenal glands are unremarkable. Kidneys are normal, without renal calculi, focal lesion, or hydronephrosis. Bladder is unremarkable. Stomach/Bowel: Appendix appears normal. No evidence of bowel wall thickening, distention, or inflammatory changes. There is a large amount of stool in the rectosigmoid colon. There is a small hiatal hernia. The remaining stomach is within normal limits. There is distention and debris in the distal esophagus. Vascular/Lymphatic: Aortic atherosclerosis. No enlarged abdominal or pelvic  lymph nodes. Reproductive: Uterus and bilateral adnexa are unremarkable. Other: No abdominal wall hernia or abnormality. No abdominopelvic ascites. Musculoskeletal: Degenerative changes affect the spine. There is stable grade 1 anterolisthesis at L4-L5. IMPRESSION: 1. There is debris in the distal esophagus with moderate distention. This can be seen with gastroesophageal reflux. Distal esophageal obstructing process not excluded. Please correlate clinically. 2. Small hiatal hernia. 3. Large amount of stool in the rectosigmoid colon. 4. Cholelithiasis. 5.  Aortic Atherosclerosis (ICD10-I70.0). Electronically Signed   By: ARonney AstersM.D.   On: 07/21/2021 22:39   DG Chest 2 View  Result Date: 07/21/2021 CLINICAL DATA:  Chest pain EXAM: CHEST - 2 VIEW COMPARISON:  07/18/2021, 07/07/2021 FINDINGS: Cardiomegaly. Increased opacity at the left lung base. Aortic atherosclerosis. No pneumothorax IMPRESSION: 1. Cardiomegaly with interim mild airspace disease at the peripheral left base, atelectasis versus pneumonia Electronically Signed   By: KDonavan FoilM.D.   On: 07/21/2021 21:18   DG Chest Port 1 View  Result Date: 07/18/2021 CLINICAL DATA:  Abnormal chest radiograph question pneumothorax EXAM: PORTABLE CHEST 1 VIEW COMPARISON:  Portable exam 1545 hours compared to 1451 hours FINDINGS: Enlargement of cardiac silhouette. Mediastinal contours and pulmonary vascularity normal. Atherosclerotic calcification aorta. Chronic bronchitic and interstitial changes stable. No acute infiltrate, pleural effusion, or pneumothorax. Bones demineralized. IMPRESSION: Enlargement of cardiac silhouette. Chronic bronchitic and interstitial changes without acute infiltrate or pneumothorax. Aortic Atherosclerosis (ICD10-I70.0). Electronically Signed   By: MLavonia DanaM.D.   On: 07/18/2021 15:55   DG Abdomen Acute W/Chest  Result Date: 07/18/2021 CLINICAL DATA:  Vomiting EXAM: DG ABDOMEN ACUTE WITH 1 VIEW CHEST COMPARISON:   04/23/2021, 07/07/2021 FINDINGS: There is no evidence of dilated bowel loops or free intraperitoneal air. Moderate volume of stool throughout the colon. No radiopaque calculi or other significant radiographic abnormality is seen. Stable mild cardiomegaly. Aortic atherosclerosis. Presumed skin fold overlies the periphery of the left hemithorax. Chronic scarring at the left costophrenic angle. Chronically coarsened interstitial markings bilaterally. IMPRESSION: 1. Presumed skin fold overlying the left hemithorax. Repeat frontal radiograph of the chest following patient repositioning is recommended to exclude the possibility of a small left pneumothorax. 2. Chronic lung changes with scarring at the left costophrenic angle. 3. Nonobstructive bowel gas pattern. Moderate volume of stool throughout the colon. Electronically Signed   By: NDavina PokeD.O.   On: 07/18/2021 15:06   DG Chest 2 View  Result Date: 07/08/2021 CLINICAL DATA:  Follow-up pneumonia. EXAM:  CHEST - 2 VIEW COMPARISON:  09/17/2021. FINDINGS: Cardiac silhouette is mildly enlarged. No mediastinal or hilar masses or evidence of adenopathy. Previously seen areas of lung opacity mostly resolved. Small area of opacity persists at the lateral left lung base on the frontal consistent with atelectasis or scarring. Remainder of the lungs is clear. No pleural effusion or pneumothorax. Skeletal structures are grossly intact. IMPRESSION: 1. No acute cardiopulmonary disease.  Resolved areas of pneumonia. Electronically Signed   By: Lajean Manes M.D.   On: 07/08/2021 15:09    Orson Eva, DO  [facility]  If 7PM-7AM, please contact night-coverage [email or web address] Password [email or web address] 07/25/2021, 5:33 PM   LOS: 4 days

## 2021-07-26 ENCOUNTER — Telehealth: Payer: Self-pay | Admitting: Internal Medicine

## 2021-07-26 DIAGNOSIS — K222 Esophageal obstruction: Secondary | ICD-10-CM | POA: Diagnosis not present

## 2021-07-26 DIAGNOSIS — F1120 Opioid dependence, uncomplicated: Secondary | ICD-10-CM | POA: Diagnosis not present

## 2021-07-26 DIAGNOSIS — I10 Essential (primary) hypertension: Secondary | ICD-10-CM | POA: Diagnosis not present

## 2021-07-26 DIAGNOSIS — R112 Nausea with vomiting, unspecified: Secondary | ICD-10-CM | POA: Diagnosis not present

## 2021-07-26 LAB — GLUCOSE, CAPILLARY
Glucose-Capillary: 112 mg/dL — ABNORMAL HIGH (ref 70–99)
Glucose-Capillary: 153 mg/dL — ABNORMAL HIGH (ref 70–99)
Glucose-Capillary: 203 mg/dL — ABNORMAL HIGH (ref 70–99)
Glucose-Capillary: 217 mg/dL — ABNORMAL HIGH (ref 70–99)
Glucose-Capillary: 236 mg/dL — ABNORMAL HIGH (ref 70–99)
Glucose-Capillary: 93 mg/dL (ref 70–99)

## 2021-07-26 MED ORDER — INSULIN ASPART 100 UNIT/ML IJ SOLN
0.0000 [IU] | Freq: Three times a day (TID) | INTRAMUSCULAR | Status: DC
  Administered 2021-07-26 (×3): 3 [IU] via SUBCUTANEOUS
  Administered 2021-07-27: 2 [IU] via SUBCUTANEOUS
  Administered 2021-07-27: 1 [IU] via SUBCUTANEOUS

## 2021-07-26 MED ORDER — ACETAMINOPHEN 325 MG PO TABS
650.0000 mg | ORAL_TABLET | Freq: Four times a day (QID) | ORAL | Status: DC | PRN
  Administered 2021-07-26 (×2): 650 mg via ORAL
  Filled 2021-07-26 (×2): qty 2

## 2021-07-26 NOTE — NC FL2 (Deleted)
[place] LEVEL OF CARE SCREENING TOOL     IDENTIFICATION  Patient Name: [name] Birthdate: 10/24/1945 Sex: female Admission Date (Current Location): 07/21/2021  [facility] and [place] Number:  [employer] and Address:  [facility] [address] [place]      Provider Number: [id number]  Attending Physician Name and Address:  Orson Eva, MD  Relative Name and Phone Number:  [name] (Legal Guardian)   [phone]    Current Level of Care: Hospital Recommended Level of Care: Assisted Living Facility Prior Approval Number:    Date Approved/Denied:   PASRR Number:    Discharge Plan: Domiciliary (Rest home)    Current Diagnoses: Patient Active Problem List   Diagnosis Date Noted   Opioid dependence ([place]) 07/24/2021   Prolonged QT interval 07/22/2021   Intractable nausea and vomiting 07/21/2021   Mood disorder ([place]) 05/18/2021   Multifocal pneumonia    Fecal impaction ([place]) 04/23/2021   Edema of right forearm    Acute respiratory failure with hypoxia ([place]) 03/22/2021   Lobar pneumonia ([place]) 03/22/2021   Sepsis due to undetermined organism ([place]) 03/22/2021   Paroxysmal atrial fibrillation ([place]) 03/22/2021   Nausea without vomiting 11/17/2020   Hyperthyroidism    Hypokalemia    SBO (small bowel obstruction) ([place]) 04/18/2020   Chest pain 02/21/2020   Hemorrhoids 11/26/2019   Atrial fibrillation with RVR ([place]) 10/04/2019   GERD (gastroesophageal reflux disease) 08/26/2019   Loss of weight 08/26/2019   Constipation 09/09/2018   Hypotension 03/21/2018   Diarrhea 07/20/2017   Primary osteoarthritis of right hip 04/18/2017   Orthostasis 03/28/2017   General weakness 03/28/2017   History of cerebrovascular accident (CVA) with residual deficit 03/28/2017   Schizophrenia ([place]) 03/28/2017   Generalized weakness 03/28/2017   Orthostatic hypotension    IBS (irritable bowel syndrome) 10/16/2016   Left  hemiparesis ([place]) 10/06/2016   Left-sided weakness 10/06/2016   Chronic superficial gastritis without bleeding    Stricture and stenosis of esophagus    Nausea with vomiting 04/20/2016   Lesion of right lobe of liver 03/22/2016   Elevated troponin 02/13/2016   IDA (iron deficiency anemia) 04/06/2015   Mixed hyperlipidemia 11/06/2014   Obesity due to excess calories 11/06/2014   Sedentary lifestyle 11/06/2014   Rectal bleeding 10/21/2014   Uncontrolled type 2 diabetes mellitus with hyperglycemia, with long-term current use of insulin ([place]) 03/24/2010   Essential hypertension 03/24/2010   DVT, lower extremity, recurrent ([place]) 03/24/2010   History of cardiovascular disorder 03/24/2010    Orientation RESPIRATION BLADDER Height & Weight     Self, Place  Normal Continent Weight: 129 lb 6.6 oz (58.7 kg) Height:  '5\' 7"'$  (170.2 cm)  BEHAVIORAL SYMPTOMS/MOOD NEUROLOGICAL BOWEL NUTRITION STATUS      Continent Diet (reduced concentrated sweets, no added salt, full liquid diet including foods such as mashed potatoes, grits, pudding and any other foods of similar consistency)  AMBULATORY STATUS COMMUNICATION OF NEEDS Skin   Limited Assist Verbally Normal                       Personal Care Assistance Level of Assistance  Bathing, Feeding, Dressing Bathing Assistance: Limited assistance Feeding assistance: Independent Dressing Assistance: Limited assistance     Functional Limitations Info  Sight, Hearing, Speech Sight Info: Adequate Hearing Info: Adequate Speech Info: Adequate    SPECIAL CARE FACTORS FREQUENCY  Contractures Contractures Info: Not present    Additional Factors Info  Code Status, Allergies Code Status Info: Full Allergies Info: Sulfa Antibiotics           Current Medications (07/26/2021):  This is the current hospital active medication list Current Facility-Administered Medications  Medication Dose Route Frequency Provider Last  Rate Last Admin   acetaminophen (TYLENOL) tablet 650 mg  650 mg Oral Q6H PRN Tat, David, MD   650 mg at 07/26/21 1326   albuterol (PROVENTIL) (2.5 MG/3ML) 0.083% nebulizer solution 2.5 mg  2.5 mg Inhalation Q6H PRN Hosie Poisson, MD       amiodarone (PACERONE) tablet 200 mg  200 mg Oral Daily Adefeso, Oladapo, DO   200 mg at 07/26/21 0816   apixaban (ELIQUIS) tablet 5 mg  5 mg Oral BID Tat, Shanon Brow, MD   5 mg at 07/26/21 0816   atorvastatin (LIPITOR) tablet 40 mg  40 mg Oral QHS Adefeso, Oladapo, DO   40 mg at 07/25/21 2117   busPIRone (BUSPAR) tablet 7.5 mg  7.5 mg Oral BID Hosie Poisson, MD   7.5 mg at 07/26/21 0815   clonazePAM (KLONOPIN) tablet 0.5 mg  0.5 mg Oral TID PRN Hosie Poisson, MD   0.5 mg at 07/26/21 1328   diltiazem (CARDIZEM CD) 24 hr capsule 120 mg  120 mg Oral Daily Adefeso, Oladapo, DO   120 mg at 07/26/21 0817   docusate sodium (COLACE) capsule 100 mg  100 mg Oral Daily Mahon, Courtney L, NP   100 mg at 07/26/21 0816   DULoxetine (CYMBALTA) DR capsule 120 mg  120 mg Oral Daily Hosie Poisson, MD   120 mg at 07/26/21 0815   gabapentin (NEURONTIN) capsule 300 mg  300 mg Oral Q8H PRN Hosie Poisson, MD   300 mg at 07/26/21 0816   insulin aspart (novoLOG) injection 0-9 Units  0-9 Units Subcutaneous TID AC & HS Orson Eva, MD   3 Units at 07/26/21 1225   insulin detemir (LEVEMIR) injection 18 Units  18 Units Subcutaneous Q2200 Orson Eva, MD   18 Units at 07/25/21 2130   latanoprost (XALATAN) 0.005 % ophthalmic solution 1 drop  1 drop Both Eyes QHS Hosie Poisson, MD   1 drop at 07/25/21 2131   metoCLOPramide (REGLAN) tablet 5 mg  5 mg Oral TID Hosie Poisson, MD   5 mg at 07/26/21 0815   metoprolol succinate (TOPROL-XL) 24 hr tablet 25 mg  25 mg Oral Daily Tat, David, MD   25 mg at 07/26/21 0815   midodrine (PROAMATINE) tablet 10 mg  10 mg Oral TID Adefeso, Oladapo, DO   10 mg at 07/26/21 0816   mirtazapine (REMERON) tablet 30 mg  30 mg Oral QHS Hosie Poisson, MD   30 mg at 07/25/21 2117    morphine (PF) 2 MG/ML injection 1 mg  1 mg Intravenous Q4H PRN Hosie Poisson, MD   1 mg at 07/26/21 0501   ondansetron (ZOFRAN) injection 4 mg  4 mg Intravenous Q8H PRN Hosie Poisson, MD   4 mg at 07/26/21 0629   oxybutynin (DITROPAN-XL) 24 hr tablet 5 mg  5 mg Oral Daily Hosie Poisson, MD   5 mg at 07/26/21 0816   pantoprazole (PROTONIX) EC tablet 40 mg  40 mg Oral BID AC Hosie Poisson, MD   40 mg at 07/26/21 0815   polyethylene glycol (MIRALAX / GLYCOLAX) packet 17 g  17 g Oral Nightly Mahon, Courtney L, NP   17 g at 07/24/21  2153   prochlorperazine (COMPAZINE) injection 10 mg  10 mg Intravenous Q6H PRN Adefeso, Oladapo, DO   10 mg at 07/25/21 1052   risperiDONE (RISPERDAL) tablet 1 mg  1 mg Oral Daily Hosie Poisson, MD   1 mg at 07/26/21 0816   risperiDONE (RISPERDAL) tablet 2 mg  2 mg Oral QHS Hosie Poisson, MD   2 mg at 07/25/21 2116   senna (SENOKOT) tablet 8.6 mg  1 tablet Oral Daily Hosie Poisson, MD   8.6 mg at 07/26/21 6945     Discharge Medications: Please see discharge summary for a list of discharge medications.  Relevant Imaging Results:  Relevant Lab Results:   Additional Information HH PT/OT  Shade Flood, LCSW

## 2021-07-26 NOTE — TOC Transition Note (Signed)
Transition of Care [facility]) - CM/SW Discharge Note   Patient Details  Name: [name] MRN: [id number] Date of Birth: [birth year]  Transition of Care [facility]) CM/SW Contact:  Shade Flood, LCSW Phone Number: 07/26/2021, 1:50 PM   Clinical Narrative:     Pt stable for dc today per MD. Updated [name] at [facility] who states that they can accept pt back today. DC clinical sent electronically. HH arranged with [facility].  Updated pt's legal guardian.  [facility] staff will transport.   No other TOC needs for dc.  Final next level of care: Assisted Living Barriers to Discharge: Barriers Resolved   Patient Goals and CMS Choice Patient states their goals for this hospitalization and ongoing recovery are:: return to [facility]      Discharge Placement                       Discharge Plan and Services                                     Social Determinants of Health (SDOH) Interventions     Readmission Risk Interventions    03/29/2021   10:51 AM 03/23/2021    2:15 PM  Readmission Risk Prevention Plan  Transportation Screening Complete Complete  HRI or [place]  Complete  Social Work Consult for [place] Planning/Counseling  Complete  Palliative Care Screening  Not Applicable  Medication Review [employer])  Complete  HRI or Home Care Consult Complete   SW Recovery Care/Counseling Consult Complete   Palliative Care Screening Not Applicable   Skilled Nursing Facility Complete

## 2021-07-26 NOTE — Progress Notes (Signed)
Physical Therapy Treatment Patient Details Name: [name] MRN: [id number] DOB: 15-Jul-1945 Today's Date: 07/26/2021   History of Present Illness [name] is a 76 y.o. female with medical history significant of hypertension, paroxysmal atrial fibrillation, anxiety/depression, orthostatic hypotension, hyperlipidemia, T2DM, prior history of stroke, history of recurrent lower extremity DVT, GERD who presents to the emergency department due to 4-day onset of intractable nausea, vomiting and abdominal pain.  She states that she presented to the emergency department 3 days ago and abdominal x-ray done at that time was normal and she was discharged home.  She complained of epigastric pain with burning sensation radiation to the chest.  She denies fever, chills, shortness of breath.    PT Comments    Patient lying in bed on therapy arrival.  She is agreeable to therapy but does report significant stomach pain.  Patient performs ankle pumps in supine then comes from supine to sit with minimal assistance with her legs today.  Once sitting on the edge of the bed; patient is able to balance with feet and legs supported and contact guard assistance.  She  performs sit to stand with min assistance to the rolling walker.  She stands with forward flexed trunk leaning on her hands and with right knee in a flexed position.  Patient fatigues quickly and needs to sit back down.  Patient returns to lying with min to moderate assistance for her legs.  Trial of bridging but patient is unable to perform; patient needs moderate assist to scoot up and over in bed.  Patient will benefit from continued skilled therapy services during the remainder of her hospital stay and at the next recommended venue of care  to address remaining deficits and promote return to optimal mobility.    Recommendations for follow up therapy are one component of a multi-disciplinary discharge planning process, led by the attending physician.   Recommendations may be updated based on patient status, additional functional criteria and insurance authorization.  Follow Up Recommendations  Home health PT     Assistance Recommended at Discharge Frequent or constant Supervision/Assistance  Patient can return home with the following A little help with bathing/dressing/bathroom;A lot of help with walking and/or transfers;Assistance with cooking/housework   Equipment Recommendations  None recommended by PT    Recommendations for Other Services       Precautions / Restrictions Precautions Precautions: Fall Restrictions Weight Bearing Restrictions: No     Mobility  Bed Mobility Overal bed mobility: Needs Assistance Bed Mobility: Supine to Sit, Sit to Supine     Supine to sit: Min guard, Min assist Sit to supine: Min guard   General bed mobility comments: as per PT note    Transfers Overall transfer level: Needs assistance Equipment used: Rolling walker (2 wheels) Transfers: Sit to/from Stand Sit to Stand: Min assist, Mod assist           General transfer comment: transfer to standing with RW with assist for weakness    Ambulation/Gait                   Stairs             Wheelchair Mobility    Modified Rankin (Stroke Patients Only)       Balance Overall balance assessment: Needs assistance Sitting-balance support: No upper extremity supported, Feet supported Sitting balance-Leahy Scale: Good Sitting balance - Comments: seated EOB   Standing balance support: Bilateral upper extremity supported, Reliant on assistive device for balance  Standing balance-Leahy Scale: Fair Standing balance comment: with RW                            Cognition Arousal/Alertness: Awake/alert Behavior During Therapy: WFL for tasks assessed/performed Overall Cognitive Status: Within Functional Limits for tasks assessed                                          Exercises Other  Exercises Other Exercises: ankle pumps in supine x 10; attempted bridge unable to perform    General Comments General comments (skin integrity, edema, etc.): stands with right knee in flexion      Pertinent Vitals/Pain Pain Assessment Pain Assessment: 0-10 Pain Score: 10-Worst pain ever Pain Location: stomach Pain Intervention(s): Monitored during session    Home Living                          Prior Function            PT Goals (current goals can now be found in the care plan section) Acute Rehab PT Goals Patient Stated Goal: return to ALF PT Goal Formulation: With patient Time For Goal Achievement: 08/05/21 Potential to Achieve Goals: Good Progress towards PT goals: Progressing toward goals    Frequency    Min 3X/week      PT Plan Current plan remains appropriate    Co-evaluation              AM-PAC PT "6 Clicks" Mobility   Outcome Measure  Help needed turning from your back to your side while in a flat bed without using bedrails?: A Little Help needed moving from lying on your back to sitting on the side of a flat bed without using bedrails?: A Little Help needed moving to and from a bed to a chair (including a wheelchair)?: A Lot Help needed standing up from a chair using your arms (e.g., wheelchair or bedside chair)?: A Lot Help needed to walk in hospital room?: A Lot Help needed climbing 3-5 steps with a railing? : A Lot 6 Click Score: 14    End of Session   Activity Tolerance: Patient limited by fatigue;Patient limited by pain Patient left: in bed;with call bell/phone within reach   PT Visit Diagnosis: Unsteadiness on feet (R26.81);Other abnormalities of gait and mobility (R26.89);Muscle weakness (generalized) (M62.81)     Time: 8937-3428 PT Time Calculation (min) (ACUTE ONLY): 13 min  Charges:  $Therapeutic Activity: 8-22 mins                     1:13 PM, 07/26/21 [facility] Kenai Fluegel MPT [facility] [place]  [phone]

## 2021-07-26 NOTE — Progress Notes (Signed)
Speech Language Pathology Treatment: Dysphagia  Patient Details Name: [name] MRN: [id number] DOB: 1946/01/02 Today's Date: 07/26/2021 Time: 7939-0300 SLP Time Calculation (min) (ACUTE ONLY): 16 min  Assessment / Plan / Recommendation Clinical Impression  Pt seen for follow up dysphagia therapy following BSE completed yesterday and esophagram completed yesterday as well. Pt reports increased tolerance of solid foods (had graham crackers at bedside). Pt observed with straw sips thin water and graham crackers and exhibited no overt signs of reduced airway protection and no reports of globus. Pt with some belching after po, but no reports of nausea or discomfort. SLP reviewed reflux and esophageal precautions with Pt due esophageal dysmotility. Pt encouraged to mobilize as able, sit upright for all eating/drinking, remain upright for at least 30 minutes after, and consume frequent, smaller meals. Recommend mechanical soft textures and thin liquids if ok with GI. SLP will sign off.    HPI HPI: 76 year old female with a history of paroxysmal atrial fibrillation on apixaban, hypertension, orthostatic hypotension, anxiety/depression, diabetes mellitus type 2, hyperlipidemia, CVA, chronic pain, schizophrenia, GERD, chronic pain, and DVT use presenting with 4-day history of intractable nausea, vomiting, and upper abdominal pain.  The patient was in the ED on 07/18/2021.  Abdominal x-ray at that time was unremarkable.  The patient was able to eat without any vomiting and the patient was discharged back to [facility] in stable condition.  The patient returned to the ED on 07/21/2021 secondary to recurrent/intractable nausea and vomiting abdominal pain.  CT of the abdomen and pelvis was performed and showed debris in the distal esophagus with moderate distention concerning for a distal esophageal obstructing lesion.  There was also a large amount of stool noted in the rectosigmoid colon.  GI was consulted to  assist with management. EGD was performed on 07/23/2021 and showed food in the middle third and lower third of the esophagus.  The patient was subsequently debated during the procedure secondary to aspiration risk.  90% of the retained food was removed with 10% pushed into the stomach.  A distal esophageal stricture was noted.  However, the patient developed atrial fibrillation with hypotension during the procedure.  Therefore the rest of the procedure was terminated and dilatation was not performed.  She was subsequently started on a full liquid diet when she returned to the medical floor. BSE requested.      SLP Plan  Discharge SLP treatment due to (comment);All goals met      Recommendations for follow up therapy are one component of a multi-disciplinary discharge planning process, led by the attending physician.  Recommendations may be updated based on patient status, additional functional criteria and insurance authorization.    Recommendations  Diet recommendations: Dysphagia 3 (mechanical soft);Thin liquid Liquids provided via: Cup;Straw Medication Administration: Whole meds with puree Supervision: Patient able to self feed Compensations: Slow rate;Small sips/bites Postural Changes and/or Swallow Maneuvers: Seated upright 90 degrees;Upright 30-60 min after meal                Oral Care Recommendations: Oral care BID Follow Up Recommendations: No SLP follow up Assistance recommended at discharge: Intermittent Supervision/Assistance SLP Visit Diagnosis: Dysphagia, unspecified (R13.10) Plan: Discharge SLP treatment due to (comment);All goals met         Thank you,  Genene Churn, [place]   [place]  07/26/2021, 2:08 PM

## 2021-07-26 NOTE — Discharge Summary (Addendum)
Physician Discharge Summary   Patient: [name] MRN: [id number] DOB: [birth year]  Admit date:     07/21/2021  Discharge date: 07/26/21  Discharge Physician: Shanon Brow Momina Hunton   PCP: Carrolyn Meiers, MD   Recommendations at discharge:   Please follow up with primary care provider within 1-2 weeks  Please repeat BMP and CBC in one week    Hospital Course: 76 year old female with a history of paroxysmal atrial fibrillation on apixaban, hypertension, orthostatic hypotension, anxiety/depression, diabetes mellitus type 2, hyperlipidemia, CVA, chronic pain, schizophrenia, GERD, chronic pain, and DVT use presenting with 4-day history of intractable nausea, vomiting, and upper abdominal pain.  The patient was in the ED on 07/18/2021.  Abdominal x-ray at that time was unremarkable.  The patient was able to eat without any vomiting and the patient was discharged back to [facility] in stable condition.  The patient returned to the ED on 07/21/2021 secondary to recurrent/intractable nausea and vomiting abdominal pain.  CT of the abdomen and pelvis was performed and showed debris in the distal esophagus with moderate distention concerning for a distal esophageal obstructing lesion.  There was also a large amount of stool noted in the rectosigmoid colon.  GI was consulted to assist with management. Last EGD 05/17/2016: Benign-appearing esophageal stenosis, small hiatal hernia, few gastric polyps, chronic gastritis, nonbleeding duodenal diverticulum.  Notably, the patient was recently admitted to the hospital from 05/18/2021 to 05/12/2021 secondary to pneumonia.  She was discharged back to her assisted living facility with Augmentin. EGD was performed on 07/23/2021 and showed food in the middle third and lower third of the esophagus.  The patient was subsequently debated during the procedure secondary to aspiration risk.  90% of the retained food was removed with 10% pushed into the stomach.  A distal esophageal  stricture was noted.  However, the patient developed atrial fibrillation with hypotension during the procedure.  Therefore the rest of the procedure was terminated and dilatation was not performed.  She was subsequently started on a full liquid diet when she returned to the medical floor.  Assessment and Plan: * Intractable nausea and vomiting Secondary to esophageal stricture and esophageal dysmotility -07/23/2021 EGD as discussed above Continuing full liquid diet>>tolerating well -Speech therapy consulted -Appreciate GI consult and follow-up -Continue IV pantoprazole twice daily -7/3 appears to be doing a little better, tolerating full liquids but still nauseous -7/3 speech recommends dys 3 with thin liquids -7/3 barium swallow>>Nonspecific esophageal dysmotility disorder, with termination of primary peristaltic waves in the mid esophagus, and tertiary contractions in the esophagus -7/4--discussed with GI, Dr. Rosemary Holms to d/c home with FULL LIQUIDS and outpt follow up in GI clinic  Stricture and stenosis of esophagus 07/23/2021 EGD as discussed above -Continue full liquid diet -Continues to have dysphagia -7/3 barium swallow>>Nonspecific esophageal dysmotility disorder, with termination of primary peristaltic waves in the mid esophagus, and tertiary contractions in the esophagus -Appreciate GI consult and follow-up Continue IV pantoprazole twice daily>>po bid -7/4--discussed with GI, Dr. Rosemary Holms to d/c home with FULL LIQUIDS and outpt follow up in GI clinic  Opioid dependence ([place]) [facility] reviewed Norco 10/325, #90, last refilled on 07/07/2021, 06/09/2018 Clonazepam 0.5 mg, #90, last filled on 07/07/2021, 06/13/2021.  Prolonged QT interval Optimize electrolytes  Paroxysmal atrial fibrillation (HCC) Continue apixaban Continue amiodarone Continue metoprolol succinate and diltiazem CD Rate controlled presently  Schizophrenia (HCC) Continue  Risperdal, Remeron, Cymbalta Continue  prn clonazepam  Orthostasis Continue midodrine  Mixed hyperlipidemia Continue statin  DVT, lower extremity, recurrent (  [place]) Continue apixaban  Essential hypertension Continue metoprolol succinate and diltiazem CD  Uncontrolled type 2 diabetes mellitus with hyperglycemia, with long-term current use of insulin (HCC) Continue Semglee Continue NovoLog sliding scale 03/22/2021 hemoglobin A1c 7.1         Consultants: GI Procedures performed: EGD  Disposition: Assisted living Diet recommendation:  FULL LIQUID DIET DISCHARGE MEDICATION: Allergies as of 07/26/2021       Reactions   Sulfa Antibiotics Rash        Medication List     STOP taking these medications    bisacodyl 5 MG EC tablet Commonly known as: Dulcolax       TAKE these medications    Abilify 2 MG tablet Generic drug: ARIPiprazole Take 1 tablet (2 mg total) by mouth daily. What changed: when to take this   acetaminophen 325 MG tablet Commonly known as: TYLENOL Take 650 mg by mouth 3 (three) times daily as needed for mild pain or moderate pain.   albuterol 108 (90 Base) MCG/ACT inhaler Commonly known as: VENTOLIN HFA Inhale 2 puffs into the lungs every 6 (six) hours as needed for wheezing or shortness of breath.   amiodarone 200 MG tablet Commonly known as: PACERONE Take 1 tablet (200 mg total) by mouth daily.   apixaban 5 MG Tabs tablet Commonly known as: ELIQUIS Take 1 tablet (5 mg total) by mouth 2 (two) times daily.   ARTIFICIAL TEARS OP Place 1 drop into both eyes 3 (three) times daily.   atorvastatin 40 MG tablet Commonly known as: LIPITOR Take 1 tablet by mouth at bedtime.   busPIRone 7.5 MG tablet Commonly known as: BUSPAR Take 7.5 mg by mouth 2 (two) times daily.   calcium carbonate 500 MG chewable tablet Commonly known as: TUMS - dosed in mg elemental calcium Chew 1 tablet by mouth 4 (four) times daily as needed for heartburn.   clonazePAM 0.5 MG tablet Commonly known  as: KLONOPIN Take 1 tablet (0.5 mg total) by mouth 3 (three) times daily as needed for anxiety.   diltiazem 120 MG 24 hr capsule Commonly known as: CARDIZEM CD Take 120 mg by mouth daily.   DULoxetine 60 MG capsule Commonly known as: CYMBALTA Take 120 mg by mouth daily.   EasyMax Test test strip Generic drug: glucose blood USE TO CHECK BLOOD SUGAR FOUR TIMES DAILY.(HOLD IF BS<70: CALL MD IF BS> 400)   gabapentin 300 MG capsule Commonly known as: NEURONTIN Take 1 capsule (300 mg total) by mouth every 8 (eight) hours as needed (pain).   GoodSense Artificial Tears 5-6 MG/ML Soln Generic drug: Polyvinyl Alcohol-Povidone Apply to eye.   HYDROcodone-acetaminophen 10-325 MG tablet Commonly known as: NORCO Take 1 tablet by mouth every 8 (eight) hours as needed for severe pain.   latanoprost 0.005 % ophthalmic solution Commonly known as: XALATAN Place 1 drop into both eyes at bedtime.   Levemir FlexTouch 100 UNIT/ML FlexPen Generic drug: insulin detemir INJECT 22 UNITS SUBCUTANEOUSLY AT BEDTIME.(HOLD IF BS<70: CALL MD IF BS> 400)   loratadine 10 MG tablet Commonly known as: CLARITIN Take 10 mg by mouth daily.   metoCLOPramide 5 MG tablet Commonly known as: REGLAN Take 5 mg by mouth 3 (three) times daily. If Zofran is not working   metoprolol succinate 50 MG 24 hr tablet Commonly known as: TOPROL-XL Take 1 tablet (50 mg total) by mouth daily. Take with or immediately following a meal.   midodrine 10 MG tablet Commonly known as: PROAMATINE Take 10 mg by  mouth 3 (three) times daily.   mirtazapine 30 MG tablet Commonly known as: REMERON Take 30 mg by mouth at bedtime.   naloxegol oxalate 12.5 MG Tabs tablet Commonly known as: MOVANTIK Take 1 tablet (12.5 mg total) by mouth daily.   NovoLOG FlexPen 100 UNIT/ML FlexPen Generic drug: insulin aspart INJECT SUBCUTANEOUSLY AS FOLLOWS WITH MEALS: 90-150=10u: 151-200=11u: 201-250=12u: 251-300=13u: 301-350=14u: 351-400=15u:  BS>400=16u & CALL MD. What changed: See the new instructions.   ondansetron 4 MG tablet Commonly known as: ZOFRAN Take by mouth 2 (two) times daily.   oxybutynin 5 MG 24 hr tablet Commonly known as: DITROPAN-XL Take 5 mg by mouth daily.   pantoprazole 40 MG tablet Commonly known as: PROTONIX Take 1 tablet (40 mg total) by mouth 2 (two) times daily before a meal.   polyethylene glycol 17 g packet Commonly known as: MIRALAX / GLYCOLAX Take 17 g by mouth daily. What changed:  when to take this reasons to take this   risperiDONE 1 MG tablet Commonly known as: RISPERDAL Take 1-2 mg by mouth 2 (two) times daily. Take 1 tablet by mouth in the morning. Take 2 tablets by mouth at bedtime.   Santyl 250 UNIT/GM ointment Generic drug: collagenase Apply 1 Application topically daily.   senna 8.6 MG tablet Commonly known as: SENOKOT Take 1 tablet by mouth daily.   Tradjenta 5 MG Tabs tablet Generic drug: linagliptin TAKE (1) TABLET BY MOUTH ONCE DAILY. What changed: See the new instructions.   Vitamin D3 50 MCG (2000 UT) Tabs Take 2,000 Units by mouth daily.        Discharge Exam: Filed Weights   07/21/21 1951 07/22/21 0058  Weight: 60.8 kg 58.7 kg   HEENT:  [place]/AT, No thrush, no icterus CV:  RRR, no rub, no S3, no S4 Lung:  bibasilar rales. No wheeze Abd:  soft/+BS, NT Ext:  No edema, no lymphangitis, no synovitis, no rash   Condition at discharge: stable  The results of significant diagnostics from this hospitalization (including imaging, microbiology, ancillary and laboratory) are listed below for reference.   Imaging Studies: DG ESOPHAGUS W SINGLE CM (SOL OR THIN BA)  Result Date: 07/25/2021 EXAM: ESOPHAGUS/BARIUM SWALLOW/TABLET STUDY TECHNIQUE: Initial scout AP supine abdominal image obtained to insure adequate colon cleansing. Barium was introduced into the colon in a retrograde fashion and refluxed from the rectum to the cecum. Spot images of the colon followed  by overhead radiographs were obtained. FLUOROSCOPY: Radiation Exposure Index (as provided by the fluoroscopic device): 9.5 mGy Kerma COMPARISON:  CT abdomen 07/21/2021 FINDINGS: The patient is infirm and not able to assume standing position or multiple projections typically performed in a esophagram. The entire exam was performed with patient in the LPO position. The patient was not able to draw fluid from a straw. Because of this, we used a blunt tip syringe to inject boluses of about 5 cc of barium into the patient's mouth for individual swallows. She was able to swallow on command in this setting. Disruption of primary peristaltic waves in the mid esophagus noted on all swallows. Non propulsive tertiary contractions noted in the mid and distal esophagus. With patient in the supine position, even after several swallows, contrast did not make its way down into the stomach. Accordingly, tilted the patient up about 10 degrees to recruit gravity. Subsequently, contrast with slowly percolated into the stomach. In this setting I was not able to distend the distal esophagus more than about 0.8 cm. A 13 mm barium tablet  extended to the distal esophagus but failed to pass even with dry swallows and with the patient being tilted up 10 degrees. IMPRESSION: 1. Nonspecific esophageal dysmotility disorder, with termination of primary peristaltic waves in the mid esophagus, and tertiary contractions in the esophagus. A 13 mm barium tablet made its way to the lower thoracic esophagus but did not further proceed due to discoordinated tertiary contractions. 2. I was not able to distend the distal esophagus beyond about 7 mm. This may well simply be due to dysmotility but I cannot exclude a stricture. 3. Ms. Funches was not able to draw fluid from a straw. We used a blunt tip syringe to inject boluses of about 5 cc of barium for individual swallows. Today's examination was performed in the LPO position. Electronically Signed   By:  Van Clines M.D.   On: 07/25/2021 15:46   CT ABDOMEN PELVIS W CONTRAST  Result Date: 07/21/2021 CLINICAL DATA:  Epigastric pain. EXAM: CT ABDOMEN AND PELVIS WITH CONTRAST TECHNIQUE: Multidetector CT imaging of the abdomen and pelvis was performed using the standard protocol following bolus administration of intravenous contrast. RADIATION DOSE REDUCTION: This exam was performed according to the departmental dose-optimization program which includes automated exposure control, adjustment of the mA and/or kV according to patient size and/or use of iterative reconstruction technique. CONTRAST:  136m OMNIPAQUE IOHEXOL 300 MG/ML  SOLN COMPARISON:  CT abdomen and pelvis 04/23/2021 FINDINGS: Lower chest: There is a calcified granuloma in the right lower lobe, unchanged. No acute findings. Hepatobiliary: Hepatic hemangioma measuring up to 3.3 cm appears unchanged. No new liver lesions. Gallstones are again noted. There is no bile duct dilatation. Pancreas: Unremarkable. No pancreatic ductal dilatation or surrounding inflammatory changes. Spleen: Normal in size without focal abnormality. Adrenals/Urinary Tract: Adrenal glands are unremarkable. Kidneys are normal, without renal calculi, focal lesion, or hydronephrosis. Bladder is unremarkable. Stomach/Bowel: Appendix appears normal. No evidence of bowel wall thickening, distention, or inflammatory changes. There is a large amount of stool in the rectosigmoid colon. There is a small hiatal hernia. The remaining stomach is within normal limits. There is distention and debris in the distal esophagus. Vascular/Lymphatic: Aortic atherosclerosis. No enlarged abdominal or pelvic lymph nodes. Reproductive: Uterus and bilateral adnexa are unremarkable. Other: No abdominal wall hernia or abnormality. No abdominopelvic ascites. Musculoskeletal: Degenerative changes affect the spine. There is stable grade 1 anterolisthesis at L4-L5. IMPRESSION: 1. There is debris in the distal  esophagus with moderate distention. This can be seen with gastroesophageal reflux. Distal esophageal obstructing process not excluded. Please correlate clinically. 2. Small hiatal hernia. 3. Large amount of stool in the rectosigmoid colon. 4. Cholelithiasis. 5.  Aortic Atherosclerosis (ICD10-I70.0). Electronically Signed   By: ARonney AstersM.D.   On: 07/21/2021 22:39   DG Chest 2 View  Result Date: 07/21/2021 CLINICAL DATA:  Chest pain EXAM: CHEST - 2 VIEW COMPARISON:  07/18/2021, 07/07/2021 FINDINGS: Cardiomegaly. Increased opacity at the left lung base. Aortic atherosclerosis. No pneumothorax IMPRESSION: 1. Cardiomegaly with interim mild airspace disease at the peripheral left base, atelectasis versus pneumonia Electronically Signed   By: KDonavan FoilM.D.   On: 07/21/2021 21:18   DG Chest Port 1 View  Result Date: 07/18/2021 CLINICAL DATA:  Abnormal chest radiograph question pneumothorax EXAM: PORTABLE CHEST 1 VIEW COMPARISON:  Portable exam 1545 hours compared to 1451 hours FINDINGS: Enlargement of cardiac silhouette. Mediastinal contours and pulmonary vascularity normal. Atherosclerotic calcification aorta. Chronic bronchitic and interstitial changes stable. No acute infiltrate, pleural effusion, or pneumothorax. Bones demineralized.  IMPRESSION: Enlargement of cardiac silhouette. Chronic bronchitic and interstitial changes without acute infiltrate or pneumothorax. Aortic Atherosclerosis (ICD10-I70.0). Electronically Signed   By: Lavonia Dana M.D.   On: 07/18/2021 15:55   DG Abdomen Acute W/Chest  Result Date: 07/18/2021 CLINICAL DATA:  Vomiting EXAM: DG ABDOMEN ACUTE WITH 1 VIEW CHEST COMPARISON:  04/23/2021, 07/07/2021 FINDINGS: There is no evidence of dilated bowel loops or free intraperitoneal air. Moderate volume of stool throughout the colon. No radiopaque calculi or other significant radiographic abnormality is seen. Stable mild cardiomegaly. Aortic atherosclerosis. Presumed skin fold  overlies the periphery of the left hemithorax. Chronic scarring at the left costophrenic angle. Chronically coarsened interstitial markings bilaterally. IMPRESSION: 1. Presumed skin fold overlying the left hemithorax. Repeat frontal radiograph of the chest following patient repositioning is recommended to exclude the possibility of a small left pneumothorax. 2. Chronic lung changes with scarring at the left costophrenic angle. 3. Nonobstructive bowel gas pattern. Moderate volume of stool throughout the colon. Electronically Signed   By: Davina Poke D.O.   On: 07/18/2021 15:06   DG Chest 2 View  Result Date: 07/08/2021 CLINICAL DATA:  Follow-up pneumonia. EXAM: CHEST - 2 VIEW COMPARISON:  09/17/2021. FINDINGS: Cardiac silhouette is mildly enlarged. No mediastinal or hilar masses or evidence of adenopathy. Previously seen areas of lung opacity mostly resolved. Small area of opacity persists at the lateral left lung base on the frontal consistent with atelectasis or scarring. Remainder of the lungs is clear. No pleural effusion or pneumothorax. Skeletal structures are grossly intact. IMPRESSION: 1. No acute cardiopulmonary disease.  Resolved areas of pneumonia. Electronically Signed   By: Lajean Manes M.D.   On: 07/08/2021 15:09    Microbiology: Results for orders placed or performed during the hospital encounter of 05/18/21  Resp Panel by RT-PCR (Flu A&B, Covid) Nasopharyngeal Swab     Status: None   Collection Time: 05/18/21  9:22 AM   Specimen: Nasopharyngeal Swab; Nasopharyngeal(NP) swabs in vial transport medium  Result Value Ref Range Status   SARS Coronavirus 2 by RT PCR NEGATIVE NEGATIVE Final    Comment: (NOTE) SARS-CoV-2 target nucleic acids are NOT DETECTED.  The SARS-CoV-2 RNA is generally detectable in upper respiratory specimens during the acute phase of infection. The lowest concentration of SARS-CoV-2 viral copies this assay can detect is 138 copies/mL. A negative result does  not preclude SARS-Cov-2 infection and should not be used as the sole basis for treatment or other patient management decisions. A negative result may occur with  improper specimen collection/handling, submission of specimen other than nasopharyngeal swab, presence of viral mutation(s) within the areas targeted by this assay, and inadequate number of viral copies(<138 copies/mL). A negative result must be combined with clinical observations, patient history, and epidemiological information. The expected result is Negative.  Fact Sheet for Patients:  EntrepreneurPulse.com.au  Fact Sheet for Healthcare Providers:  IncredibleEmployment.be  This test is no t yet approved or cleared by the [place] FDA and  has been authorized for detection and/or diagnosis of SARS-CoV-2 by FDA under an Emergency Use Authorization (EUA). This EUA will remain  in effect (meaning this test can be used) for the duration of the COVID-19 declaration under Section 564(b)(1) of the Act, 21 U.S.C.section 360bbb-3(b)(1), unless the authorization is terminated  or revoked sooner.       Influenza A by PCR NEGATIVE NEGATIVE Final   Influenza B by PCR NEGATIVE NEGATIVE Final    Comment: (NOTE) The Xpert Xpress SARS-CoV-2/FLU/RSV plus assay is intended  as an aid in the diagnosis of influenza from Nasopharyngeal swab specimens and should not be used as a sole basis for treatment. Nasal washings and aspirates are unacceptable for Xpert Xpress SARS-CoV-2/FLU/RSV testing.  Fact Sheet for Patients: EntrepreneurPulse.com.au  Fact Sheet for Healthcare Providers: IncredibleEmployment.be  This test is not yet approved or cleared by the [place] FDA and has been authorized for detection and/or diagnosis of SARS-CoV-2 by FDA under an Emergency Use Authorization (EUA). This EUA will remain in effect (meaning this test can be used) for the  duration of the COVID-19 declaration under Section 564(b)(1) of the Act, 21 U.S.C. section 360bbb-3(b)(1), unless the authorization is terminated or revoked.  Performed at [facility], [address] [place], [place] [address]     Labs: CBC: Recent Labs  Lab 07/21/21 2013 07/22/21 0530 07/23/21 0609 07/25/21 0617  WBC 8.0 7.3 6.4 6.6  NEUTROABS  --   --  4.6  --   HGB 12.7 11.7* 11.9* 11.0*  HCT 39.3 35.8* 37.4 34.0*  MCV 87.1 86.9 85.8 87.4  PLT 278 237 241 160   Basic Metabolic Panel: Recent Labs  Lab 07/21/21 2013 07/22/21 0530 07/23/21 0609 07/24/21 1509 07/25/21 0617  NA 137 138 137 137 138  K 4.1 4.2 3.5 3.6 3.4*  CL 99 102 105 105 104  CO2 '30 31 27 28 28  '$ GLUCOSE 121* 162* 150* 126* 71  BUN '15 12 11 15 14  '$ CREATININE 0.77 0.77 0.71 0.74 0.64  CALCIUM 9.0 8.9 8.5* 8.3* 8.1*  MG  --  2.1  --  1.9 2.0  PHOS  --  3.5  --   --   --    Liver Function Tests: Recent Labs  Lab 07/21/21 2013 07/22/21 0530  AST 17 12*  ALT 13 11  ALKPHOS 75 64  BILITOT 0.5 0.4  PROT 8.0 6.9  ALBUMIN 4.1 3.4*   CBG: Recent Labs  Lab 07/25/21 2016 07/26/21 0001 07/26/21 0437 07/26/21 0724 07/26/21 1122  GLUCAP 193* 153* 93 112* 236*    Discharge time spent: greater than 30 minutes.  Signed: Orson Eva, MD [facility] 07/26/2021

## 2021-07-26 NOTE — NC FL2 (Deleted)
[place] LEVEL OF CARE SCREENING TOOL     IDENTIFICATION  Patient Name: [name] Birthdate: [birth year] Sex: female Admission Date (Current Location): 07/21/2021  [facility] and [place] Number:  [employer] and Address:  [facility] [address] [place]      Provider Number: [id number]  Attending Physician Name and Address:  Orson Eva, MD  Relative Name and Phone Number:  [name] (Legal Guardian)   [phone]    Current Level of Care: Hospital Recommended Level of Care: Assisted Living Facility Prior Approval Number:    Date Approved/Denied:   PASRR Number:    Discharge Plan: Domiciliary (Rest home)    Current Diagnoses: Patient Active Problem List   Diagnosis Date Noted   Opioid dependence ([place]) 07/24/2021   Prolonged QT interval 07/22/2021   Intractable nausea and vomiting 07/21/2021   Mood disorder ([place]) 05/18/2021   Multifocal pneumonia    Fecal impaction ([place]) 04/23/2021   Edema of right forearm    Acute respiratory failure with hypoxia ([place]) 03/22/2021   Lobar pneumonia ([place]) 03/22/2021   Sepsis due to undetermined organism ([place]) 03/22/2021   Paroxysmal atrial fibrillation ([place]) 03/22/2021   Nausea without vomiting 11/17/2020   Hyperthyroidism    Hypokalemia    SBO (small bowel obstruction) ([place]) 04/18/2020   Chest pain 02/21/2020   Hemorrhoids 11/26/2019   Atrial fibrillation with RVR ([place]) 10/04/2019   GERD (gastroesophageal reflux disease) 08/26/2019   Loss of weight 08/26/2019   Constipation 09/09/2018   Hypotension 03/21/2018   Diarrhea 07/20/2017   Primary osteoarthritis of right hip 04/18/2017   Orthostasis 03/28/2017   General weakness 03/28/2017   History of cerebrovascular accident (CVA) with residual deficit 03/28/2017   Schizophrenia ([place]) 03/28/2017   Generalized weakness 03/28/2017   Orthostatic hypotension    IBS (irritable bowel syndrome) 10/16/2016   Left  hemiparesis ([place]) 10/06/2016   Left-sided weakness 10/06/2016   Chronic superficial gastritis without bleeding    Stricture and stenosis of esophagus    Nausea with vomiting 04/20/2016   Lesion of right lobe of liver 03/22/2016   Elevated troponin 02/13/2016   IDA (iron deficiency anemia) 04/06/2015   Mixed hyperlipidemia 11/06/2014   Obesity due to excess calories 11/06/2014   Sedentary lifestyle 11/06/2014   Rectal bleeding 10/21/2014   Uncontrolled type 2 diabetes mellitus with hyperglycemia, with long-term current use of insulin ([place]) 03/24/2010   Essential hypertension 03/24/2010   DVT, lower extremity, recurrent ([place]) 03/24/2010   History of cardiovascular disorder 03/24/2010    Orientation RESPIRATION BLADDER Height & Weight     Self, Place  Normal Continent Weight: 129 lb 6.6 oz (58.7 kg) Height:  '5\' 7"'$  (170.2 cm)  BEHAVIORAL SYMPTOMS/MOOD NEUROLOGICAL BOWEL NUTRITION STATUS      Continent Diet (Reduced concentrated sweets, no added salt)  AMBULATORY STATUS COMMUNICATION OF NEEDS Skin   Limited Assist Verbally Normal                       Personal Care Assistance Level of Assistance  Bathing, Feeding, Dressing Bathing Assistance: Limited assistance Feeding assistance: Independent Dressing Assistance: Limited assistance     Functional Limitations Info  Sight, Hearing, Speech Sight Info: Adequate Hearing Info: Adequate Speech Info: Adequate    SPECIAL CARE FACTORS FREQUENCY                       Contractures Contractures Info: Not present    Additional  Factors Info  Code Status, Allergies Code Status Info: Full Allergies Info: Sulfa Antibiotics           Current Medications (07/26/2021):  This is the current hospital active medication list Current Facility-Administered Medications  Medication Dose Route Frequency Provider Last Rate Last Admin   acetaminophen (TYLENOL) tablet 650 mg  650 mg Oral Q6H PRN Tat, David, MD   650 mg at 07/26/21  1326   albuterol (PROVENTIL) (2.5 MG/3ML) 0.083% nebulizer solution 2.5 mg  2.5 mg Inhalation Q6H PRN Hosie Poisson, MD       amiodarone (PACERONE) tablet 200 mg  200 mg Oral Daily Adefeso, Oladapo, DO   200 mg at 07/26/21 0816   apixaban (ELIQUIS) tablet 5 mg  5 mg Oral BID Tat, Shanon Brow, MD   5 mg at 07/26/21 0816   atorvastatin (LIPITOR) tablet 40 mg  40 mg Oral QHS Adefeso, Oladapo, DO   40 mg at 07/25/21 2117   busPIRone (BUSPAR) tablet 7.5 mg  7.5 mg Oral BID Hosie Poisson, MD   7.5 mg at 07/26/21 0815   clonazePAM (KLONOPIN) tablet 0.5 mg  0.5 mg Oral TID PRN Hosie Poisson, MD   0.5 mg at 07/26/21 1328   diltiazem (CARDIZEM CD) 24 hr capsule 120 mg  120 mg Oral Daily Adefeso, Oladapo, DO   120 mg at 07/26/21 0817   docusate sodium (COLACE) capsule 100 mg  100 mg Oral Daily Mahon, Courtney L, NP   100 mg at 07/26/21 0816   DULoxetine (CYMBALTA) DR capsule 120 mg  120 mg Oral Daily Hosie Poisson, MD   120 mg at 07/26/21 0815   gabapentin (NEURONTIN) capsule 300 mg  300 mg Oral Q8H PRN Hosie Poisson, MD   300 mg at 07/26/21 0816   insulin aspart (novoLOG) injection 0-9 Units  0-9 Units Subcutaneous TID AC & HS Orson Eva, MD   3 Units at 07/26/21 1225   insulin detemir (LEVEMIR) injection 18 Units  18 Units Subcutaneous Q2200 Orson Eva, MD   18 Units at 07/25/21 2130   latanoprost (XALATAN) 0.005 % ophthalmic solution 1 drop  1 drop Both Eyes QHS Hosie Poisson, MD   1 drop at 07/25/21 2131   metoCLOPramide (REGLAN) tablet 5 mg  5 mg Oral TID Hosie Poisson, MD   5 mg at 07/26/21 0815   metoprolol succinate (TOPROL-XL) 24 hr tablet 25 mg  25 mg Oral Daily Tat, David, MD   25 mg at 07/26/21 0815   midodrine (PROAMATINE) tablet 10 mg  10 mg Oral TID Adefeso, Oladapo, DO   10 mg at 07/26/21 0816   mirtazapine (REMERON) tablet 30 mg  30 mg Oral QHS Hosie Poisson, MD   30 mg at 07/25/21 2117   morphine (PF) 2 MG/ML injection 1 mg  1 mg Intravenous Q4H PRN Hosie Poisson, MD   1 mg at 07/26/21 0501    ondansetron (ZOFRAN) injection 4 mg  4 mg Intravenous Q8H PRN Hosie Poisson, MD   4 mg at 07/26/21 0629   oxybutynin (DITROPAN-XL) 24 hr tablet 5 mg  5 mg Oral Daily Hosie Poisson, MD   5 mg at 07/26/21 0816   pantoprazole (PROTONIX) EC tablet 40 mg  40 mg Oral BID AC Hosie Poisson, MD   40 mg at 07/26/21 0815   polyethylene glycol (MIRALAX / GLYCOLAX) packet 17 g  17 g Oral Nightly Venetia Night L, NP   17 g at 07/24/21 2153   prochlorperazine (COMPAZINE) injection 10 mg  10 mg Intravenous Q6H PRN Adefeso, Oladapo, DO   10 mg at 07/25/21 1052   risperiDONE (RISPERDAL) tablet 1 mg  1 mg Oral Daily Hosie Poisson, MD   1 mg at 07/26/21 0816   risperiDONE (RISPERDAL) tablet 2 mg  2 mg Oral QHS Hosie Poisson, MD   2 mg at 07/25/21 2116   senna (SENOKOT) tablet 8.6 mg  1 tablet Oral Daily Hosie Poisson, MD   8.6 mg at 07/26/21 0816     Discharge Medications:  STOP taking these medications     bisacodyl 5 MG EC tablet Commonly known as: Dulcolax           TAKE these medications     Abilify 2 MG tablet Generic drug: ARIPiprazole Take 1 tablet (2 mg total) by mouth daily. What changed: when to take this    acetaminophen 325 MG tablet Commonly known as: TYLENOL Take 650 mg by mouth 3 (three) times daily as needed for mild pain or moderate pain.    albuterol 108 (90 Base) MCG/ACT inhaler Commonly known as: VENTOLIN HFA Inhale 2 puffs into the lungs every 6 (six) hours as needed for wheezing or shortness of breath.    amiodarone 200 MG tablet Commonly known as: PACERONE Take 1 tablet (200 mg total) by mouth daily.    apixaban 5 MG Tabs tablet Commonly known as: ELIQUIS Take 1 tablet (5 mg total) by mouth 2 (two) times daily.    ARTIFICIAL TEARS OP Place 1 drop into both eyes 3 (three) times daily.    atorvastatin 40 MG tablet Commonly known as: LIPITOR Take 1 tablet by mouth at bedtime.    busPIRone 7.5 MG tablet Commonly known as: BUSPAR Take 7.5 mg by mouth 2 (two) times  daily.    calcium carbonate 500 MG chewable tablet Commonly known as: TUMS - dosed in mg elemental calcium Chew 1 tablet by mouth 4 (four) times daily as needed for heartburn.    clonazePAM 0.5 MG tablet Commonly known as: KLONOPIN Take 1 tablet (0.5 mg total) by mouth 3 (three) times daily as needed for anxiety.    diltiazem 120 MG 24 hr capsule Commonly known as: CARDIZEM CD Take 120 mg by mouth daily.    DULoxetine 60 MG capsule Commonly known as: CYMBALTA Take 120 mg by mouth daily.    EasyMax Test test strip Generic drug: glucose blood USE TO CHECK BLOOD SUGAR FOUR TIMES DAILY.(HOLD IF BS<70: CALL MD IF BS> 400)    gabapentin 300 MG capsule Commonly known as: NEURONTIN Take 1 capsule (300 mg total) by mouth every 8 (eight) hours as needed (pain).    GoodSense Artificial Tears 5-6 MG/ML Soln Generic drug: Polyvinyl Alcohol-Povidone Apply to eye.    HYDROcodone-acetaminophen 10-325 MG tablet Commonly known as: NORCO Take 1 tablet by mouth every 8 (eight) hours as needed for severe pain.    latanoprost 0.005 % ophthalmic solution Commonly known as: XALATAN Place 1 drop into both eyes at bedtime.    Levemir FlexTouch 100 UNIT/ML FlexPen Generic drug: insulin detemir INJECT 22 UNITS SUBCUTANEOUSLY AT BEDTIME.(HOLD IF BS<70: CALL MD IF BS> 400)    loratadine 10 MG tablet Commonly known as: CLARITIN Take 10 mg by mouth daily.    metoCLOPramide 5 MG tablet Commonly known as: REGLAN Take 5 mg by mouth 3 (three) times daily. If Zofran is not working    metoprolol succinate 50 MG 24 hr tablet Commonly known as: TOPROL-XL Take 1 tablet (50 mg total) by  mouth daily. Take with or immediately following a meal.    midodrine 10 MG tablet Commonly known as: PROAMATINE Take 10 mg by mouth 3 (three) times daily.    mirtazapine 30 MG tablet Commonly known as: REMERON Take 30 mg by mouth at bedtime.    naloxegol oxalate 12.5 MG Tabs tablet Commonly known as:  MOVANTIK Take 1 tablet (12.5 mg total) by mouth daily.    NovoLOG FlexPen 100 UNIT/ML FlexPen Generic drug: insulin aspart INJECT SUBCUTANEOUSLY AS FOLLOWS WITH MEALS: 90-150=10u: 151-200=11u: 201-250=12u: 251-300=13u: 301-350=14u: 351-400=15u: BS>400=16u & CALL MD. What changed: See the new instructions.    ondansetron 4 MG tablet Commonly known as: ZOFRAN Take by mouth 2 (two) times daily.    oxybutynin 5 MG 24 hr tablet Commonly known as: DITROPAN-XL Take 5 mg by mouth daily.    pantoprazole 40 MG tablet Commonly known as: PROTONIX Take 1 tablet (40 mg total) by mouth 2 (two) times daily before a meal.    polyethylene glycol 17 g packet Commonly known as: MIRALAX / GLYCOLAX Take 17 g by mouth daily. What changed:  when to take this reasons to take this    risperiDONE 1 MG tablet Commonly known as: RISPERDAL Take 1-2 mg by mouth 2 (two) times daily. Take 1 tablet by mouth in the morning. Take 2 tablets by mouth at bedtime.    Santyl 250 UNIT/GM ointment Generic drug: collagenase Apply 1 Application topically daily.    senna 8.6 MG tablet Commonly known as: SENOKOT Take 1 tablet by mouth daily.    Tradjenta 5 MG Tabs tablet Generic drug: linagliptin TAKE (1) TABLET BY MOUTH ONCE DAILY. What changed: See the new instructions.    Vitamin D3 50 MCG (2000 UT) Tabs Take 2,000 Units by mouth daily.    Relevant Imaging Results:  Relevant Lab Results:   Additional Information HH PT/OT  Shade Flood, LCSW

## 2021-07-26 NOTE — TOC Progression Note (Signed)
Transition of Care [facility]) - Progression Note    Patient Details  Name: RETIA CORDLE MRN: [id number] Date of Birth: [birth year]  [facility]) CM/SW Contact  Shade Flood, LCSW Phone Number: 07/26/2021, 10:30 AM  Clinical Narrative:     TOC following. DC planning reviewed with MD in progression rounds today. Anticipating possible dc back to [facility] tomorrow. TOC will follow up in AM.  Expected Discharge Plan: Assisted Living Barriers to Discharge: Hospice Bed not available  Expected Discharge Plan and Services Expected Discharge Plan: Assisted Living                                               Social Determinants of Health (SDOH) Interventions    Readmission Risk Interventions    03/29/2021   10:51 AM 03/23/2021    2:15 PM  Readmission Risk Prevention Plan  Transportation Screening Complete Complete  HRI or Home Care Consult  Complete  Social Work Consult for [place] Planning/Counseling  Complete  Palliative Care Screening  Not Applicable  Medication Review [employer])  Complete  HRI or Home Care Consult Complete   SW Recovery Care/Counseling Consult Complete   Palliative Care Screening Not Applicable   Skilled Nursing Facility Complete

## 2021-07-26 NOTE — NC FL2 (Deleted)
[place] LEVEL OF CARE SCREENING TOOL     IDENTIFICATION  Patient Name: [name] Birthdate: [birth year] Sex: female Admission Date (Current Location): 07/21/2021  [facility] and [place] Number:  [employer] and Address:  [facility] [address] [place]      Provider Number: [id number]  Attending Physician Name and Address:  Orson Eva, MD  Relative Name and Phone Number:  [name] (Legal Guardian)   [phone]    Current Level of Care: Hospital Recommended Level of Care: Assisted Living Facility Prior Approval Number:    Date Approved/Denied:   PASRR Number:    Discharge Plan: Domiciliary (Rest home)    Current Diagnoses: Patient Active Problem List   Diagnosis Date Noted   Opioid dependence ([place]) 07/24/2021   Prolonged QT interval 07/22/2021   Intractable nausea and vomiting 07/21/2021   Mood disorder ([place]) 05/18/2021   Multifocal pneumonia    Fecal impaction ([place]) 04/23/2021   Edema of right forearm    Acute respiratory failure with hypoxia ([place]) 03/22/2021   Lobar pneumonia ([place]) 03/22/2021   Sepsis due to undetermined organism ([place]) 03/22/2021   Paroxysmal atrial fibrillation ([place]) 03/22/2021   Nausea without vomiting 11/17/2020   Hyperthyroidism    Hypokalemia    SBO (small bowel obstruction) ([place]) 04/18/2020   Chest pain 02/21/2020   Hemorrhoids 11/26/2019   Atrial fibrillation with RVR ([place]) 10/04/2019   GERD (gastroesophageal reflux disease) 08/26/2019   Loss of weight 08/26/2019   Constipation 09/09/2018   Hypotension 03/21/2018   Diarrhea 07/20/2017   Primary osteoarthritis of right hip 04/18/2017   Orthostasis 03/28/2017   General weakness 03/28/2017   History of cerebrovascular accident (CVA) with residual deficit 03/28/2017   Schizophrenia ([place]) 03/28/2017   Generalized weakness 03/28/2017   Orthostatic hypotension    IBS (irritable bowel syndrome) 10/16/2016   Left  hemiparesis ([place]) 10/06/2016   Left-sided weakness 10/06/2016   Chronic superficial gastritis without bleeding    Stricture and stenosis of esophagus    Nausea with vomiting 04/20/2016   Lesion of right lobe of liver 03/22/2016   Elevated troponin 02/13/2016   IDA (iron deficiency anemia) 04/06/2015   Mixed hyperlipidemia 11/06/2014   Obesity due to excess calories 11/06/2014   Sedentary lifestyle 11/06/2014   Rectal bleeding 10/21/2014   Uncontrolled type 2 diabetes mellitus with hyperglycemia, with long-term current use of insulin ([place]) 03/24/2010   Essential hypertension 03/24/2010   DVT, lower extremity, recurrent ([place]) 03/24/2010   History of cardiovascular disorder 03/24/2010    Orientation RESPIRATION BLADDER Height & Weight     Self, Place  Normal Continent Weight: 129 lb 6.6 oz (58.7 kg) Height:  '5\' 7"'$  (170.2 cm)  BEHAVIORAL SYMPTOMS/MOOD NEUROLOGICAL BOWEL NUTRITION STATUS      Continent Diet (reduced concentrated sweets, no added salt, full liquid diet including foods such as mashed potatoes, grits, pudding and any other foods of similar consistency)  AMBULATORY STATUS COMMUNICATION OF NEEDS Skin   Limited Assist Verbally Normal                       Personal Care Assistance Level of Assistance  Bathing, Feeding, Dressing Bathing Assistance: Limited assistance Feeding assistance: Independent Dressing Assistance: Limited assistance     Functional Limitations Info  Sight, Hearing, Speech Sight Info: Adequate Hearing Info: Adequate Speech Info: Adequate    SPECIAL CARE FACTORS FREQUENCY  Contractures Contractures Info: Not present    Additional Factors Info  Psychotropic Code Status Info: Full Allergies Info: Sulfa Antibiotics Psychotropic Info: Abilify, Buspar, Clonazepam, Cymbalta, Risperdal         Current Medications (07/26/2021):  This is the current hospital active medication list Current Facility-Administered  Medications  Medication Dose Route Frequency Provider Last Rate Last Admin   acetaminophen (TYLENOL) tablet 650 mg  650 mg Oral Q6H PRN Tat, David, MD   650 mg at 07/26/21 1326   albuterol (PROVENTIL) (2.5 MG/3ML) 0.083% nebulizer solution 2.5 mg  2.5 mg Inhalation Q6H PRN Hosie Poisson, MD       amiodarone (PACERONE) tablet 200 mg  200 mg Oral Daily Adefeso, Oladapo, DO   200 mg at 07/26/21 0816   apixaban (ELIQUIS) tablet 5 mg  5 mg Oral BID Tat, Shanon Brow, MD   5 mg at 07/26/21 0816   atorvastatin (LIPITOR) tablet 40 mg  40 mg Oral QHS Adefeso, Oladapo, DO   40 mg at 07/25/21 2117   busPIRone (BUSPAR) tablet 7.5 mg  7.5 mg Oral BID Hosie Poisson, MD   7.5 mg at 07/26/21 0815   clonazePAM (KLONOPIN) tablet 0.5 mg  0.5 mg Oral TID PRN Hosie Poisson, MD   0.5 mg at 07/26/21 1328   diltiazem (CARDIZEM CD) 24 hr capsule 120 mg  120 mg Oral Daily Adefeso, Oladapo, DO   120 mg at 07/26/21 0817   docusate sodium (COLACE) capsule 100 mg  100 mg Oral Daily Mahon, Courtney L, NP   100 mg at 07/26/21 0816   DULoxetine (CYMBALTA) DR capsule 120 mg  120 mg Oral Daily Hosie Poisson, MD   120 mg at 07/26/21 0815   gabapentin (NEURONTIN) capsule 300 mg  300 mg Oral Q8H PRN Hosie Poisson, MD   300 mg at 07/26/21 0816   insulin aspart (novoLOG) injection 0-9 Units  0-9 Units Subcutaneous TID AC & HS Orson Eva, MD   3 Units at 07/26/21 1225   insulin detemir (LEVEMIR) injection 18 Units  18 Units Subcutaneous Q2200 Orson Eva, MD   18 Units at 07/25/21 2130   latanoprost (XALATAN) 0.005 % ophthalmic solution 1 drop  1 drop Both Eyes QHS Hosie Poisson, MD   1 drop at 07/25/21 2131   metoCLOPramide (REGLAN) tablet 5 mg  5 mg Oral TID Hosie Poisson, MD   5 mg at 07/26/21 0815   metoprolol succinate (TOPROL-XL) 24 hr tablet 25 mg  25 mg Oral Daily Tat, David, MD   25 mg at 07/26/21 0815   midodrine (PROAMATINE) tablet 10 mg  10 mg Oral TID Adefeso, Oladapo, DO   10 mg at 07/26/21 0816   mirtazapine (REMERON) tablet 30 mg   30 mg Oral QHS Hosie Poisson, MD   30 mg at 07/25/21 2117   morphine (PF) 2 MG/ML injection 1 mg  1 mg Intravenous Q4H PRN Hosie Poisson, MD   1 mg at 07/26/21 0501   ondansetron (ZOFRAN) injection 4 mg  4 mg Intravenous Q8H PRN Hosie Poisson, MD   4 mg at 07/26/21 0629   oxybutynin (DITROPAN-XL) 24 hr tablet 5 mg  5 mg Oral Daily Hosie Poisson, MD   5 mg at 07/26/21 0816   pantoprazole (PROTONIX) EC tablet 40 mg  40 mg Oral BID AC Hosie Poisson, MD   40 mg at 07/26/21 0815   polyethylene glycol (MIRALAX / GLYCOLAX) packet 17 g  17 g Oral Nightly Sherron Monday, NP   17  g at 07/24/21 2153   prochlorperazine (COMPAZINE) injection 10 mg  10 mg Intravenous Q6H PRN Adefeso, Oladapo, DO   10 mg at 07/25/21 1052   risperiDONE (RISPERDAL) tablet 1 mg  1 mg Oral Daily Hosie Poisson, MD   1 mg at 07/26/21 0816   risperiDONE (RISPERDAL) tablet 2 mg  2 mg Oral QHS Hosie Poisson, MD   2 mg at 07/25/21 2116   senna (SENOKOT) tablet 8.6 mg  1 tablet Oral Daily Hosie Poisson, MD   8.6 mg at 07/26/21 0816     Discharge Medications:  STOP taking these medications     bisacodyl 5 MG EC tablet Commonly known as: Dulcolax           TAKE these medications     Abilify 2 MG tablet Generic drug: ARIPiprazole Take 1 tablet (2 mg total) by mouth daily. What changed: when to take this    acetaminophen 325 MG tablet Commonly known as: TYLENOL Take 650 mg by mouth 3 (three) times daily as needed for mild pain or moderate pain.    albuterol 108 (90 Base) MCG/ACT inhaler Commonly known as: VENTOLIN HFA Inhale 2 puffs into the lungs every 6 (six) hours as needed for wheezing or shortness of breath.    amiodarone 200 MG tablet Commonly known as: PACERONE Take 1 tablet (200 mg total) by mouth daily.    apixaban 5 MG Tabs tablet Commonly known as: ELIQUIS Take 1 tablet (5 mg total) by mouth 2 (two) times daily.    ARTIFICIAL TEARS OP Place 1 drop into both eyes 3 (three) times daily.     atorvastatin 40 MG tablet Commonly known as: LIPITOR Take 1 tablet by mouth at bedtime.    busPIRone 7.5 MG tablet Commonly known as: BUSPAR Take 7.5 mg by mouth 2 (two) times daily.    calcium carbonate 500 MG chewable tablet Commonly known as: TUMS - dosed in mg elemental calcium Chew 1 tablet by mouth 4 (four) times daily as needed for heartburn.    clonazePAM 0.5 MG tablet Commonly known as: KLONOPIN Take 1 tablet (0.5 mg total) by mouth 3 (three) times daily as needed for anxiety.    diltiazem 120 MG 24 hr capsule Commonly known as: CARDIZEM CD Take 120 mg by mouth daily.    DULoxetine 60 MG capsule Commonly known as: CYMBALTA Take 120 mg by mouth daily.    EasyMax Test test strip Generic drug: glucose blood USE TO CHECK BLOOD SUGAR FOUR TIMES DAILY.(HOLD IF BS<70: CALL MD IF BS> 400)    gabapentin 300 MG capsule Commonly known as: NEURONTIN Take 1 capsule (300 mg total) by mouth every 8 (eight) hours as needed (pain).    GoodSense Artificial Tears 5-6 MG/ML Soln Generic drug: Polyvinyl Alcohol-Povidone Apply to eye.    HYDROcodone-acetaminophen 10-325 MG tablet Commonly known as: NORCO Take 1 tablet by mouth every 8 (eight) hours as needed for severe pain.    latanoprost 0.005 % ophthalmic solution Commonly known as: XALATAN Place 1 drop into both eyes at bedtime.    Levemir FlexTouch 100 UNIT/ML FlexPen Generic drug: insulin detemir INJECT 22 UNITS SUBCUTANEOUSLY AT BEDTIME.(HOLD IF BS<70: CALL MD IF BS> 400)    loratadine 10 MG tablet Commonly known as: CLARITIN Take 10 mg by mouth daily.    metoCLOPramide 5 MG tablet Commonly known as: REGLAN Take 5 mg by mouth 3 (three) times daily. If Zofran is not working    metoprolol succinate 50 MG 24 hr  tablet Commonly known as: TOPROL-XL Take 1 tablet (50 mg total) by mouth daily. Take with or immediately following a meal.    midodrine 10 MG tablet Commonly known as: PROAMATINE Take 10 mg by mouth 3  (three) times daily.    mirtazapine 30 MG tablet Commonly known as: REMERON Take 30 mg by mouth at bedtime.    naloxegol oxalate 12.5 MG Tabs tablet Commonly known as: MOVANTIK Take 1 tablet (12.5 mg total) by mouth daily.    NovoLOG FlexPen 100 UNIT/ML FlexPen Generic drug: insulin aspart INJECT SUBCUTANEOUSLY AS FOLLOWS WITH MEALS: 90-150=10u: 151-200=11u: 201-250=12u: 251-300=13u: 301-350=14u: 351-400=15u: BS>400=16u & CALL MD. What changed: See the new instructions.    ondansetron 4 MG tablet Commonly known as: ZOFRAN Take by mouth 2 (two) times daily.    oxybutynin 5 MG 24 hr tablet Commonly known as: DITROPAN-XL Take 5 mg by mouth daily.    pantoprazole 40 MG tablet Commonly known as: PROTONIX Take 1 tablet (40 mg total) by mouth 2 (two) times daily before a meal.    polyethylene glycol 17 g packet Commonly known as: MIRALAX / GLYCOLAX Take 17 g by mouth daily. What changed:  when to take this reasons to take this    risperiDONE 1 MG tablet Commonly known as: RISPERDAL Take 1-2 mg by mouth 2 (two) times daily. Take 1 tablet by mouth in the morning. Take 2 tablets by mouth at bedtime.    Santyl 250 UNIT/GM ointment Generic drug: collagenase Apply 1 Application topically daily.    senna 8.6 MG tablet Commonly known as: SENOKOT Take 1 tablet by mouth daily.    Tradjenta 5 MG Tabs tablet Generic drug: linagliptin TAKE (1) TABLET BY MOUTH ONCE DAILY. What changed: See the new instructions.    Vitamin D3 50 MCG (2000 UT) Tabs Take 2,000 Units by mouth daily.    Relevant Imaging Results:  Relevant Lab Results:   Additional Information HH PT/OT  Shade Flood, LCSW

## 2021-07-26 NOTE — TOC Progression Note (Signed)
Transition of Care [facility]) - Progression Note    Patient Details  Name: [name] MRN: [id number] Date of Birth: 03-Feb-1945  Transition of Care [facility]) CM/SW Contact  Shade Flood, LCSW Phone Number: 07/26/2021, 3:12 PM  Clinical Narrative:     Pt now unable to dc due to ALF pharmacy being closed for the holiday and dc med orders are different than pt's previous administration instructions. Pt will have to dc tomorrow when the pharmacy is open and the changes can be logged. MD and RN aware.  Expected Discharge Plan: Assisted Living Barriers to Discharge: Barriers Resolved  Expected Discharge Plan and Services Expected Discharge Plan: Assisted Living         Expected Discharge Date: 07/26/21                                     Social Determinants of Health (SDOH) Interventions    Readmission Risk Interventions    03/29/2021   10:51 AM 03/23/2021    2:15 PM  Readmission Risk Prevention Plan  Transportation Screening Complete Complete  HRI or Home Care Consult  Complete  Social Work Consult for [place] Planning/Counseling  Complete  Palliative Care Screening  Not Applicable  Medication Review Press photographer)  Complete  HRI or Home Care Consult Complete   SW Recovery Care/Counseling Consult Complete   Palliative Care Screening Not Applicable   Skilled Nursing Facility Complete

## 2021-07-26 NOTE — NC FL2 (Signed)
[place] LEVEL OF CARE SCREENING TOOL     IDENTIFICATION  Patient Name: [name] Birthdate: May 12, 1945 Sex: female Admission Date (Current Location): 07/21/2021  [facility] and [place] Number:  [employer] and Address:  [place] [address], [place]      Provider Number: [id number]  Attending Physician Name and Address:  Orson Eva, MD  Relative Name and Phone Number:  [name] (Legal Guardian)   [phone]    Current Level of Care: Hospital Recommended Level of Care: [place] Prior Approval Number:    Date Approved/Denied:   PASRR Number:    Discharge Plan: Domiciliary (Rest home)    Current Diagnoses: Patient Active Problem List   Diagnosis Date Noted   Opioid dependence ([place]) 07/24/2021   Prolonged QT interval 07/22/2021   Intractable nausea and vomiting 07/21/2021   Mood disorder ([place]) 05/18/2021   Multifocal pneumonia    Fecal impaction ([place]) 04/23/2021   Edema of right forearm    Acute respiratory failure with hypoxia ([place]) 03/22/2021   Lobar pneumonia ([place]) 03/22/2021   Sepsis due to undetermined organism ([place]) 03/22/2021   Paroxysmal atrial fibrillation ([place]) 03/22/2021   Nausea without vomiting 11/17/2020   Hyperthyroidism    Hypokalemia    SBO (small bowel obstruction) ([place]) 04/18/2020   Chest pain 02/21/2020   Hemorrhoids 11/26/2019   Atrial fibrillation with RVR ([place]) 10/04/2019   GERD (gastroesophageal reflux disease) 08/26/2019   Loss of weight 08/26/2019   Constipation 09/09/2018   Hypotension 03/21/2018   Diarrhea 07/20/2017   Primary osteoarthritis of right hip 04/18/2017   Orthostasis 03/28/2017   General weakness 03/28/2017   History of cerebrovascular accident (CVA) with residual deficit 03/28/2017   Schizophrenia ([place]) 03/28/2017   Generalized weakness 03/28/2017   Orthostatic hypotension    IBS (irritable bowel syndrome) 10/16/2016   Left  hemiparesis ([place]) 10/06/2016   Left-sided weakness 10/06/2016   Chronic superficial gastritis without bleeding    Stricture and stenosis of esophagus    Nausea with vomiting 04/20/2016   Lesion of right lobe of liver 03/22/2016   Elevated troponin 02/13/2016   IDA (iron deficiency anemia) 04/06/2015   Mixed hyperlipidemia 11/06/2014   Obesity due to excess calories 11/06/2014   Sedentary lifestyle 11/06/2014   Rectal bleeding 10/21/2014   Uncontrolled type 2 diabetes mellitus with hyperglycemia, with long-term current use of insulin ([place]) 03/24/2010   Essential hypertension 03/24/2010   DVT, lower extremity, recurrent ([place]) 03/24/2010   History of cardiovascular disorder 03/24/2010    Orientation RESPIRATION BLADDER Height & Weight     Self, Place  Normal Continent Weight: 129 lb 6.6 oz (58.7 kg) Height:  '5\' 7"'$  (170.2 cm)  BEHAVIORAL SYMPTOMS/MOOD NEUROLOGICAL BOWEL NUTRITION STATUS      Continent Diet (reduced concentrated sweets, no added salt, liquids, and foods with the consistency of mashed potatoes, yogurt, grits, pudding, etc.)  AMBULATORY STATUS COMMUNICATION OF NEEDS Skin   Limited Assist Verbally Normal                       Personal Care Assistance Level of Assistance  Bathing, Feeding, Dressing Bathing Assistance: Limited assistance Feeding assistance: Independent Dressing Assistance: Limited assistance     Functional Limitations Info  Sight, Hearing, Speech Sight Info: Adequate Hearing Info: Adequate Speech Info: Adequate    SPECIAL CARE FACTORS FREQUENCY  Contractures Contractures Info: Not present    Additional Factors Info  Psychotropic Code Status Info: Full Allergies Info: Sulfa Antibiotics Psychotropic Info: Abilify, Buspar, Clonazepam, Cymbalta, Risperdal         Current Medications (07/26/2021):  This is the current hospital active medication list Current Facility-Administered Medications  Medication Dose  Route Frequency Provider Last Rate Last Admin   acetaminophen (TYLENOL) tablet 650 mg  650 mg Oral Q6H PRN Tat, David, MD   650 mg at 07/26/21 1326   albuterol (PROVENTIL) (2.5 MG/3ML) 0.083% nebulizer solution 2.5 mg  2.5 mg Inhalation Q6H PRN Hosie Poisson, MD       amiodarone (PACERONE) tablet 200 mg  200 mg Oral Daily Adefeso, Oladapo, DO   200 mg at 07/26/21 0816   apixaban (ELIQUIS) tablet 5 mg  5 mg Oral BID Tat, Shanon Brow, MD   5 mg at 07/26/21 0816   atorvastatin (LIPITOR) tablet 40 mg  40 mg Oral QHS Adefeso, Oladapo, DO   40 mg at 07/25/21 2117   busPIRone (BUSPAR) tablet 7.5 mg  7.5 mg Oral BID Hosie Poisson, MD   7.5 mg at 07/26/21 0815   clonazePAM (KLONOPIN) tablet 0.5 mg  0.5 mg Oral TID PRN Hosie Poisson, MD   0.5 mg at 07/26/21 1328   diltiazem (CARDIZEM CD) 24 hr capsule 120 mg  120 mg Oral Daily Adefeso, Oladapo, DO   120 mg at 07/26/21 0817   docusate sodium (COLACE) capsule 100 mg  100 mg Oral Daily Mahon, Courtney L, NP   100 mg at 07/26/21 0816   DULoxetine (CYMBALTA) DR capsule 120 mg  120 mg Oral Daily Hosie Poisson, MD   120 mg at 07/26/21 0815   gabapentin (NEURONTIN) capsule 300 mg  300 mg Oral Q8H PRN Hosie Poisson, MD   300 mg at 07/26/21 0816   insulin aspart (novoLOG) injection 0-9 Units  0-9 Units Subcutaneous TID AC & HS Orson Eva, MD   3 Units at 07/26/21 1225   insulin detemir (LEVEMIR) injection 18 Units  18 Units Subcutaneous Q2200 Orson Eva, MD   18 Units at 07/25/21 2130   latanoprost (XALATAN) 0.005 % ophthalmic solution 1 drop  1 drop Both Eyes QHS Hosie Poisson, MD   1 drop at 07/25/21 2131   metoCLOPramide (REGLAN) tablet 5 mg  5 mg Oral TID Hosie Poisson, MD   5 mg at 07/26/21 0815   metoprolol succinate (TOPROL-XL) 24 hr tablet 25 mg  25 mg Oral Daily Tat, David, MD   25 mg at 07/26/21 0815   midodrine (PROAMATINE) tablet 10 mg  10 mg Oral TID Adefeso, Oladapo, DO   10 mg at 07/26/21 0816   mirtazapine (REMERON) tablet 30 mg  30 mg Oral QHS Hosie Poisson, MD   30 mg at 07/25/21 2117   morphine (PF) 2 MG/ML injection 1 mg  1 mg Intravenous Q4H PRN Hosie Poisson, MD   1 mg at 07/26/21 0501   ondansetron (ZOFRAN) injection 4 mg  4 mg Intravenous Q8H PRN Hosie Poisson, MD   4 mg at 07/26/21 0629   oxybutynin (DITROPAN-XL) 24 hr tablet 5 mg  5 mg Oral Daily Hosie Poisson, MD   5 mg at 07/26/21 0816   pantoprazole (PROTONIX) EC tablet 40 mg  40 mg Oral BID AC Hosie Poisson, MD   40 mg at 07/26/21 0815   polyethylene glycol (MIRALAX / GLYCOLAX) packet 17 g  17 g Oral Nightly Sherron Monday, NP   17  g at 07/24/21 2153   prochlorperazine (COMPAZINE) injection 10 mg  10 mg Intravenous Q6H PRN Adefeso, Oladapo, DO   10 mg at 07/25/21 1052   risperiDONE (RISPERDAL) tablet 1 mg  1 mg Oral Daily Hosie Poisson, MD   1 mg at 07/26/21 0816   risperiDONE (RISPERDAL) tablet 2 mg  2 mg Oral QHS Hosie Poisson, MD   2 mg at 07/25/21 2116   senna (SENOKOT) tablet 8.6 mg  1 tablet Oral Daily Hosie Poisson, MD   8.6 mg at 07/26/21 0816     Discharge Medications:  STOP taking these medications     bisacodyl 5 MG EC tablet Commonly known as: Dulcolax           TAKE these medications     Abilify 2 MG tablet Generic drug: ARIPiprazole Take 1 tablet (2 mg total) by mouth daily. What changed: when to take this    acetaminophen 325 MG tablet Commonly known as: TYLENOL Take 650 mg by mouth 3 (three) times daily as needed for mild pain or moderate pain.    albuterol 108 (90 Base) MCG/ACT inhaler Commonly known as: VENTOLIN HFA Inhale 2 puffs into the lungs every 6 (six) hours as needed for wheezing or shortness of breath.    amiodarone 200 MG tablet Commonly known as: PACERONE Take 1 tablet (200 mg total) by mouth daily.    apixaban 5 MG Tabs tablet Commonly known as: ELIQUIS Take 1 tablet (5 mg total) by mouth 2 (two) times daily.    ARTIFICIAL TEARS OP Place 1 drop into both eyes 3 (three) times daily.    atorvastatin 40 MG  tablet Commonly known as: LIPITOR Take 1 tablet by mouth at bedtime.    busPIRone 7.5 MG tablet Commonly known as: BUSPAR Take 7.5 mg by mouth 2 (two) times daily.    calcium carbonate 500 MG chewable tablet Commonly known as: TUMS - dosed in mg elemental calcium Chew 1 tablet by mouth 4 (four) times daily as needed for heartburn.    clonazePAM 0.5 MG tablet Commonly known as: KLONOPIN Take 1 tablet (0.5 mg total) by mouth 3 (three) times daily as needed for anxiety.    diltiazem 120 MG 24 hr capsule Commonly known as: CARDIZEM CD Take 120 mg by mouth daily.    DULoxetine 60 MG capsule Commonly known as: CYMBALTA Take 120 mg by mouth daily.    EasyMax Test test strip Generic drug: glucose blood USE TO CHECK BLOOD SUGAR FOUR TIMES DAILY.(HOLD IF BS<70: CALL MD IF BS> 400)    gabapentin 300 MG capsule Commonly known as: NEURONTIN Take 1 capsule (300 mg total) by mouth every 8 (eight) hours as needed (pain).    GoodSense Artificial Tears 5-6 MG/ML Soln Generic drug: Polyvinyl Alcohol-Povidone Apply to eye.    HYDROcodone-acetaminophen 10-325 MG tablet Commonly known as: NORCO Take 1 tablet by mouth every 8 (eight) hours as needed for severe pain.    latanoprost 0.005 % ophthalmic solution Commonly known as: XALATAN Place 1 drop into both eyes at bedtime.    Levemir FlexTouch 100 UNIT/ML FlexPen Generic drug: insulin detemir INJECT 22 UNITS SUBCUTANEOUSLY AT BEDTIME.(HOLD IF BS<70: CALL MD IF BS> 400)    loratadine 10 MG tablet Commonly known as: CLARITIN Take 10 mg by mouth daily.    metoCLOPramide 5 MG tablet Commonly known as: REGLAN Take 5 mg by mouth 3 (three) times daily. If Zofran is not working    metoprolol succinate 50 MG 24 hr  tablet Commonly known as: TOPROL-XL Take 1 tablet (50 mg total) by mouth daily. Take with or immediately following a meal.    midodrine 10 MG tablet Commonly known as: PROAMATINE Take 10 mg by mouth 3 (three) times daily.     mirtazapine 30 MG tablet Commonly known as: REMERON Take 30 mg by mouth at bedtime.    naloxegol oxalate 12.5 MG Tabs tablet Commonly known as: MOVANTIK Take 1 tablet (12.5 mg total) by mouth daily.    NovoLOG FlexPen 100 UNIT/ML FlexPen Generic drug: insulin aspart INJECT SUBCUTANEOUSLY AS FOLLOWS WITH MEALS: 90-150=10u: 151-200=11u: 201-250=12u: 251-300=13u: 301-350=14u: 351-400=15u: BS>400=16u & CALL MD. What changed: See the new instructions.    ondansetron 4 MG tablet Commonly known as: ZOFRAN Take by mouth 2 (two) times daily.    oxybutynin 5 MG 24 hr tablet Commonly known as: DITROPAN-XL Take 5 mg by mouth daily.    pantoprazole 40 MG tablet Commonly known as: PROTONIX Take 1 tablet (40 mg total) by mouth 2 (two) times daily before a meal.    polyethylene glycol 17 g packet Commonly known as: MIRALAX / GLYCOLAX Take 17 g by mouth daily. What changed:  when to take this reasons to take this    risperiDONE 1 MG tablet Commonly known as: RISPERDAL Take 1-2 mg by mouth 2 (two) times daily. Take 1 tablet by mouth in the morning. Take 2 tablets by mouth at bedtime.    Santyl 250 UNIT/GM ointment Generic drug: collagenase Apply 1 Application topically daily.    senna 8.6 MG tablet Commonly known as: SENOKOT Take 1 tablet by mouth daily.    Tradjenta 5 MG Tabs tablet Generic drug: linagliptin TAKE (1) TABLET BY MOUTH ONCE DAILY. What changed: See the new instructions.    Vitamin D3 50 MCG (2000 UT) Tabs Take 2,000 Units by mouth daily.    Relevant Imaging Results:  Relevant Lab Results:   Additional Information HH PT/OT  Shade Flood, LCSW

## 2021-07-26 NOTE — Telephone Encounter (Signed)
Please arrange hospital follow-up visit for this patient with app or myself.  Thank you

## 2021-07-27 LAB — GLUCOSE, CAPILLARY
Glucose-Capillary: 142 mg/dL — ABNORMAL HIGH (ref 70–99)
Glucose-Capillary: 163 mg/dL — ABNORMAL HIGH (ref 70–99)

## 2021-07-27 NOTE — Progress Notes (Signed)
Pt has discharge orders, discharge teaching given and legal guardian contacted to be informed of discharge, at this time awaiting arrival of [facility] staff member to pick up pt.

## 2021-07-27 NOTE — TOC Transition Note (Signed)
Transition of Care [facility]) - CM/SW Discharge Note   Patient Details  Name: [name] MRN: [id number] Date of Birth: 1945/08/07  [facility]) CM/SW Contact:  Shade Flood, LCSW Phone Number: 07/27/2021, 10:57 AM   Clinical Narrative:     [name] with [name] at [facility] this AM and they are able to accept pt back today. Updated FL2 faxed. [facility] will refer pt to [facility] for [facility] PT/OT per [facility].  [facility] staff will pick up pt around 1pm. Updated RN.  There are no other TOC needs for dc.  Final next level of care: Assisted Living Barriers to Discharge: Barriers Resolved   Patient Goals and CMS Choice Patient states their goals for this hospitalization and ongoing recovery are:: return to [facility]      Discharge Placement                       Discharge Plan and Services                                     Social Determinants of Health (SDOH) Interventions     Readmission Risk Interventions    03/29/2021   10:51 AM 03/23/2021    2:15 PM  Readmission Risk Prevention Plan  Transportation Screening Complete Complete  HRI or [place]  Complete  Social Work Consult for [place] Planning/Counseling  Complete  Palliative Care Screening  Not Applicable  Medication Review Press photographer)  Complete  HRI or Home Care Consult Complete   SW Recovery Care/Counseling Consult Complete   Palliative Care Screening Not Applicable   Skilled Nursing Facility Complete

## 2021-08-04 ENCOUNTER — Emergency Department (HOSPITAL_COMMUNITY): Payer: Medicare Other

## 2021-08-04 ENCOUNTER — Other Ambulatory Visit: Payer: Self-pay

## 2021-08-04 ENCOUNTER — Encounter (HOSPITAL_COMMUNITY): Payer: Self-pay | Admitting: Emergency Medicine

## 2021-08-04 ENCOUNTER — Emergency Department (HOSPITAL_COMMUNITY)
Admission: EM | Admit: 2021-08-04 | Discharge: 2021-08-04 | Disposition: A | Discharge status: Home / Self Care | Payer: Medicare Other | Attending: Emergency Medicine | Admitting: Emergency Medicine

## 2021-08-04 DIAGNOSIS — Z7901 Long term (current) use of anticoagulants: Secondary | ICD-10-CM | POA: Diagnosis not present

## 2021-08-04 DIAGNOSIS — Z7984 Long term (current) use of oral hypoglycemic drugs: Secondary | ICD-10-CM | POA: Diagnosis not present

## 2021-08-04 DIAGNOSIS — Z79899 Other long term (current) drug therapy: Secondary | ICD-10-CM | POA: Diagnosis not present

## 2021-08-04 DIAGNOSIS — Z794 Long term (current) use of insulin: Secondary | ICD-10-CM | POA: Insufficient documentation

## 2021-08-04 DIAGNOSIS — R5383 Other fatigue: Secondary | ICD-10-CM | POA: Insufficient documentation

## 2021-08-04 DIAGNOSIS — R4182 Altered mental status, unspecified: Secondary | ICD-10-CM | POA: Diagnosis present

## 2021-08-04 DIAGNOSIS — R400 Somnolence: Secondary | ICD-10-CM | POA: Diagnosis not present

## 2021-08-04 LAB — CBC WITH DIFFERENTIAL/PLATELET
Abs Immature Granulocytes: 0.01 10*3/uL (ref 0.00–0.07)
Basophils Absolute: 0 10*3/uL (ref 0.0–0.1)
Basophils Relative: 0 %
Eosinophils Absolute: 0.1 10*3/uL (ref 0.0–0.5)
Eosinophils Relative: 2 %
HCT: 36.2 % (ref 36.0–46.0)
Hemoglobin: 11.2 g/dL — ABNORMAL LOW (ref 12.0–15.0)
Immature Granulocytes: 0 %
Lymphocytes Relative: 22 %
Lymphs Abs: 1.4 10*3/uL (ref 0.7–4.0)
MCH: 27.3 pg (ref 26.0–34.0)
MCHC: 30.9 g/dL (ref 30.0–36.0)
MCV: 88.3 fL (ref 80.0–100.0)
Monocytes Absolute: 0.5 10*3/uL (ref 0.1–1.0)
Monocytes Relative: 7 %
Neutro Abs: 4.5 10*3/uL (ref 1.7–7.7)
Neutrophils Relative %: 69 %
Platelets: 318 10*3/uL (ref 150–400)
RBC: 4.1 MIL/uL (ref 3.87–5.11)
RDW: 14.8 % (ref 11.5–15.5)
WBC: 6.5 10*3/uL (ref 4.0–10.5)
nRBC: 0 % (ref 0.0–0.2)

## 2021-08-04 LAB — RAPID URINE DRUG SCREEN, HOSP PERFORMED
Amphetamines: NOT DETECTED
Barbiturates: NOT DETECTED
Benzodiazepines: NOT DETECTED
Cocaine: NOT DETECTED
Opiates: POSITIVE — AB
Tetrahydrocannabinol: NOT DETECTED

## 2021-08-04 LAB — COMPREHENSIVE METABOLIC PANEL
ALT: 13 U/L (ref 0–44)
AST: 15 U/L (ref 15–41)
Albumin: 3.4 g/dL — ABNORMAL LOW (ref 3.5–5.0)
Alkaline Phosphatase: 67 U/L (ref 38–126)
Anion gap: 5 (ref 5–15)
BUN: 15 mg/dL (ref 8–23)
CO2: 29 mmol/L (ref 22–32)
Calcium: 8.6 mg/dL — ABNORMAL LOW (ref 8.9–10.3)
Chloride: 103 mmol/L (ref 98–111)
Creatinine, Ser: 0.87 mg/dL (ref 0.44–1.00)
GFR, Estimated: >60 mL/min (ref >60)
Glucose, Bld: 104 mg/dL — ABNORMAL HIGH (ref 70–99)
Potassium: 4.2 mmol/L (ref 3.5–5.1)
Sodium: 137 mmol/L (ref 135–145)
Total Bilirubin: 0.4 mg/dL (ref 0.3–1.2)
Total Protein: 6.7 g/dL (ref 6.5–8.1)

## 2021-08-04 LAB — AMMONIA: Ammonia: 26 umol/L (ref 9–35)

## 2021-08-04 LAB — URINALYSIS, ROUTINE W REFLEX MICROSCOPIC
Bilirubin Urine: NEGATIVE
Glucose, UA: NEGATIVE mg/dL
Hgb urine dipstick: NEGATIVE
Ketones, ur: NEGATIVE mg/dL
Leukocytes,Ua: NEGATIVE
Nitrite: NEGATIVE
Protein, ur: NEGATIVE mg/dL
Specific Gravity, Urine: 1.009 (ref 1.005–1.030)
pH: 7 (ref 5.0–8.0)

## 2021-08-04 LAB — CBG MONITORING, ED: Glucose-Capillary: 93 mg/dL (ref 70–99)

## 2021-08-04 LAB — ETHANOL: Alcohol, Ethyl (B): <10 mg/dL (ref <10)

## 2021-08-04 LAB — MAGNESIUM: Magnesium: 2.2 mg/dL (ref 1.7–2.4)

## 2021-08-04 MED ORDER — ACETAMINOPHEN 325 MG PO TABS
650.0000 mg | ORAL_TABLET | Freq: Once | ORAL | Status: AC
  Administered 2021-08-04: 650 mg via ORAL
  Filled 2021-08-04: qty 2

## 2021-08-04 MED ORDER — SODIUM CHLORIDE 0.9 % IV BOLUS
500.0000 mL | Freq: Once | INTRAVENOUS | Status: AC
  Administered 2021-08-04: 500 mL via INTRAVENOUS

## 2021-08-04 NOTE — ED Provider Notes (Signed)
[facility] EMERGENCY DEPARTMENT Provider Note   CSN: [id number] Arrival date & time: 08/04/21  7824     History  Chief Complaint  Patient presents with   Altered Mental Status    [name] is a 76 y.o. female.  She is brought in by EMS from her facility for altered mental status.  Staff reportedly found her with pinpoint pupils today staring off into space.  Concern for overmedication.  Patient states she did not sleep well last night due to her chronic back pain.  She denies any complaints other than her back pain.  The history is provided by the patient and the EMS personnel.  Altered Mental Status Presenting symptoms: lethargy   Most recent episode:  Today Timing:  Constant Progression:  Unchanged Context: nursing home resident   Associated symptoms: no abdominal pain, no difficulty breathing, no fever and no headaches        Home Medications Prior to Admission medications   Medication Sig Start Date End Date Taking? Authorizing Provider  ABILIFY 2 MG tablet Take 1 tablet (2 mg total) by mouth daily. Patient taking differently: Take 2 mg by mouth at bedtime. 11/09/20 07/21/21  [name], MD  acetaminophen (TYLENOL) 325 MG tablet Take 650 mg by mouth 3 (three) times daily as needed for mild pain or moderate pain.     [provider]  albuterol (VENTOLIN HFA) 108 (90 Base) MCG/ACT inhaler Inhale 2 puffs into the lungs every 6 (six) hours as needed for wheezing or shortness of breath. 05/20/21   Barton Dubois, MD  amiodarone (PACERONE) 200 MG tablet Take 1 tablet (200 mg total) by mouth daily. 04/24/21   Barton Dubois, MD  apixaban (ELIQUIS) 5 MG TABS tablet Take 1 tablet (5 mg total) by mouth 2 (two) times daily. 02/21/20   Richarda Osmond, MD  atorvastatin (LIPITOR) 40 MG tablet Take 1 tablet by mouth at bedtime. 05/30/20   [provider]  busPIRone (BUSPAR) 7.5 MG tablet Take 7.5 mg by mouth 2 (two) times daily. 07/07/21   [provider]   calcium carbonate (TUMS - DOSED IN MG ELEMENTAL CALCIUM) 500 MG chewable tablet Chew 1 tablet by mouth 4 (four) times daily as needed for heartburn.    [provider]  Carboxymethylcellulose Sodium (ARTIFICIAL TEARS OP) Place 1 drop into both eyes 3 (three) times daily.    [provider]  Cholecalciferol (VITAMIN D3) 50 MCG (2000 UT) TABS Take 2,000 Units by mouth daily.    [provider]  clonazePAM (KLONOPIN) 0.5 MG tablet Take 1 tablet (0.5 mg total) by mouth 3 (three) times daily as needed for anxiety. 05/20/21   Barton Dubois, MD  diltiazem (CARDIZEM CD) 120 MG 24 hr capsule Take 120 mg by mouth daily. 05/30/20   [provider]  DULoxetine (CYMBALTA) 60 MG capsule Take 120 mg by mouth daily.    [provider]  EASYMAX TEST test strip USE TO CHECK BLOOD SUGAR FOUR TIMES DAILY.(HOLD IF BS<70: CALL MD IF BS> 400) 07/21/21   Reardon, Juanetta Beets, NP  gabapentin (NEURONTIN) 300 MG capsule Take 1 capsule (300 mg total) by mouth every 8 (eight) hours as needed (pain). 05/20/21   Barton Dubois, MD  GOODSENSE ARTIFICIAL TEARS 0.5-0.6 % SOLN Apply to eye. 06/03/21   [provider]  HYDROcodone-acetaminophen (NORCO) 10-325 MG tablet Take 1 tablet by mouth every 8 (eight) hours as needed for severe pain. 05/20/21   Barton Dubois, MD  insulin detemir (  LEVEMIR FLEXTOUCH) 100 UNIT/ML FlexPen INJECT 22 UNITS SUBCUTANEOUSLY AT BEDTIME.(HOLD IF BS<70: CALL MD IF BS> 400) 07/15/21   Reardon, Juanetta Beets, NP  latanoprost (XALATAN) 0.005 % ophthalmic solution Place 1 drop into both eyes at bedtime.    [provider]  loratadine (CLARITIN) 10 MG tablet Take 10 mg by mouth daily.    [provider]  metoCLOPramide (REGLAN) 5 MG tablet Take 5 mg by mouth 3 (three) times daily. If Zofran is not working 06/27/21   [provider]  metoprolol succinate (TOPROL-XL) 50 MG 24 hr tablet Take 1 tablet (50 mg total) by mouth daily. Take with or  immediately following a meal. 04/01/21   Tat, Shanon Brow, MD  midodrine (PROAMATINE) 10 MG tablet Take 10 mg by mouth 3 (three) times daily.    [provider]  mirtazapine (REMERON) 30 MG tablet Take 30 mg by mouth at bedtime.    [provider]  naloxegol oxalate (MOVANTIK) 12.5 MG TABS tablet Take 1 tablet (12.5 mg total) by mouth daily. 04/24/21   Barton Dubois, MD  NOVOLOG FLEXPEN 100 UNIT/ML FlexPen INJECT SUBCUTANEOUSLY AS FOLLOWS WITH MEALS: 90-150=10u: 151-200=11u: 201-250=12u: 251-300=13u: 301-350=14u: 351-400=15u: BS>400=16u & CALL MD. Patient taking differently: 10-16 Units 3 (three) times daily with meals. INJECT SUBCUTANEOUSLY AS FOLLOWS WITH MEALS: 90-150=10u: 151-200=11u: 201-250=12u: 251-300=13u: 301-350=14u: 351-400=15u: BS>400=16u & CALL MD. 02/28/21   Brita Romp, NP  ondansetron (ZOFRAN) 4 MG tablet Take by mouth 2 (two) times daily. 06/24/21   [provider]  oxybutynin (DITROPAN-XL) 5 MG 24 hr tablet Take 5 mg by mouth daily. 04/20/21   [provider]  pantoprazole (PROTONIX) 40 MG tablet Take 1 tablet (40 mg total) by mouth 2 (two) times daily before a meal. 11/17/20   Mahala Menghini, PA-C  polyethylene glycol (MIRALAX / GLYCOLAX) 17 g packet Take 17 g by mouth daily. Patient taking differently: Take 17 g by mouth daily as needed for mild constipation. 04/24/21   Barton Dubois, MD  risperiDONE (RISPERDAL) 1 MG tablet Take 1-2 mg by mouth 2 (two) times daily. Take 1 tablet by mouth in the morning. Take 2 tablets by mouth at bedtime. 04/04/21   [provider]  SANTYL 250 UNIT/GM ointment Apply 1 Application topically daily. 07/05/21   [provider]  senna (SENOKOT) 8.6 MG tablet Take 1 tablet by mouth daily.    [provider]  TRADJENTA 5 MG TABS tablet TAKE (1) TABLET BY MOUTH ONCE DAILY. Patient taking differently: Take 5 mg by mouth daily. 04/04/21   Brita Romp, NP      Allergies    Sulfa antibiotics     Review of Systems   Review of Systems  Constitutional:  Negative for fever.  Respiratory:  Negative for shortness of breath.   Cardiovascular:  Negative for chest pain.  Gastrointestinal:  Negative for abdominal pain.  Genitourinary:  Negative for dysuria.  Musculoskeletal:  Positive for back pain.  Neurological:  Negative for headaches.    Physical Exam Updated Vital Signs BP 105/68 (BP Location: Left Arm)   Pulse 76   Temp 97.6 F (36.4 C) (Oral)   Resp (!) 24   Ht '5\' 7"'$  (1.702 m)   Wt 58.7 kg   SpO2 94%   BMI 20.27 kg/m  Physical Exam Vitals and nursing note reviewed.  Constitutional:      General: She is not in acute distress.    Appearance: Normal appearance. She is well-developed.  HENT:  Head: Normocephalic and atraumatic.  Eyes:     Conjunctiva/sclera: Conjunctivae normal.  Cardiovascular:     Rate and Rhythm: Normal rate and regular rhythm.     Heart sounds: No murmur heard. Pulmonary:     Effort: Pulmonary effort is normal. No respiratory distress.     Breath sounds: Normal breath sounds.  Abdominal:     Palpations: Abdomen is soft.     Tenderness: There is no abdominal tenderness. There is no guarding or rebound.  Musculoskeletal:        General: No swelling.     Cervical back: Neck supple.  Skin:    General: Skin is warm and dry.     Capillary Refill: Capillary refill takes less than 2 seconds.  Neurological:     Mental Status: She is alert. Mental status is at baseline.     Comments: She had a prior stroke with limited use of her left arm and no use of her left leg.  She is alert and oriented x3.  Slow to respond but appropriate.  No definite slurred speech or facial asymmetry.     ED Results / Procedures / Treatments   Labs (all labs ordered are listed, but only abnormal results are displayed) Labs Reviewed  CBC WITH DIFFERENTIAL/PLATELET - Abnormal; Notable for the following components:      Result Value   Hemoglobin 11.2 (*)    All  other components within normal limits  COMPREHENSIVE METABOLIC PANEL - Abnormal; Notable for the following components:   Glucose, Bld 104 (*)    Calcium 8.6 (*)    Albumin 3.4 (*)    All other components within normal limits  RAPID URINE DRUG SCREEN, HOSP PERFORMED - Abnormal; Notable for the following components:   Opiates POSITIVE (*)    All other components within normal limits  URINALYSIS, ROUTINE W REFLEX MICROSCOPIC  AMMONIA  ETHANOL  MAGNESIUM  CBG MONITORING, ED    EKG EKG Interpretation  Date/Time:  Thursday August 04 2021 10:05:22 EDT Ventricular Rate:  81 PR Interval:    QRS Duration: 97 QT Interval:  433 QTC Calculation: 503 R Axis:   -42 Text Interpretation: Atrial fibrillation Inferior infarct, old Consider anterior infarct Prolonged QT interval No significant change since prior 7/23 Confirmed by Aletta Edouard [id number]) on 08/04/2021 10:41:54 AM  Radiology DG Chest Port 1 View  Result Date: 08/04/2021 CLINICAL DATA:  Weakness. EXAM: PORTABLE CHEST 1 VIEW COMPARISON:  Chest x-ray dated July 21, 2021. FINDINGS: The patient is rotated to the left. Unchanged mild cardiomegaly. Normal pulmonary vascularity. No focal consolidation, pleural effusion, or pneumothorax. No acute osseous abnormality. IMPRESSION: No active disease. Electronically Signed   By: Titus Dubin M.D.   On: 08/04/2021 11:22   CT Head Wo Contrast  Result Date: 08/04/2021 CLINICAL DATA:  Altered mental status this morning EXAM: CT HEAD WITHOUT CONTRAST TECHNIQUE: Contiguous axial images were obtained from the base of the skull through the vertex without intravenous contrast. RADIATION DOSE REDUCTION: This exam was performed according to the departmental dose-optimization program which includes automated exposure control, adjustment of the mA and/or kV according to patient size and/or use of iterative reconstruction technique. COMPARISON:  05/29/2021 FINDINGS: Brain: Periventricular white matter and corona  radiata hypodensities favor chronic ischemic microvascular white matter disease. Otherwise, the brainstem, cerebellum, cerebral peduncles, thalamus, basal ganglia, basilar cisterns, and ventricular system appear within normal limits. No intracranial hemorrhage, mass lesion, or acute CVA. Vascular: There is atherosclerotic calcification of the cavernous carotid arteries bilaterally.  Skull: Unremarkable Sinuses/Orbits: Mild chronic left maxillary sinusitis. Prior ethmoidectomies. Prior maxillary antrostomies and unroofing of the left sphenoid sinus. Small right mastoid effusion. Other: No supplemental non-categorized findings. IMPRESSION: 1. No acute intracranial findings. Periventricular white matter and corona radiata hypodensities favor chronic ischemic microvascular white matter disease. 2. Prior sinus surgeries. Mild chronic left maxillary sinusitis and small right mastoid effusion. 3. Atherosclerosis. Electronically Signed   By: Van Clines M.D.   On: 08/04/2021 11:14    Procedures Procedures    Medications Ordered in ED Medications  sodium chloride 0.9 % bolus 500 mL (has no administration in time range)    ED Course/ Medical Decision Making/ A&P Clinical Course as of 08/04/21 1911  Thu Aug 04, 2021  1118 Patient's chest x-ray showing significant cardiomegaly no gross infiltrate.  Head CT does not show any acute bleed.  Awaiting radiology reading. [MB]  1308 Discussed with patient's guardian [name] who is okay with the plan for her to return back to her facility. [MB]    Clinical Course User Index [MB] Hayden Rasmussen, MD                           Medical Decision Making Amount and/or Complexity of Data Reviewed Labs: ordered. Radiology: ordered.  Risk OTC drugs.  This patient complains of lethargy; this involves an extensive number of treatment Options and is a complaint that carries with it a high risk of complications and morbidity. The differential includes  sepsis, Sirs, infection, stroke, bleed, overmedication, dehydration  I ordered, reviewed and interpreted labs, which included CBC with normal white count stable hemoglobin, chemistries unremarkable, urine without signs of infection, U tox positive for opiates which she is on, ammonia and alcohol normal I ordered medication IV fluids oral Tylenol and reviewed PMP when indicated. I ordered imaging studies which included chest x-ray and head CT and I independently    visualized and interpreted imaging which showed no acute findings Additional history obtained from EMS Previous records obtained and reviewed in epic, recent ED visits and admission for generalized weakness nausea and vomiting  Cardiac monitoring reviewed, normal sinus rhythm Social determinants considered, no significant barriers Critical Interventions: None  After the interventions stated above, I reevaluated the patient and found patient to be sleepy but arousable oriented no complaints Admission and further testing considered, no indications for admission at this time.  Reviewed case with patient's guardian who agrees for patient return back to her facility.          Final Clinical Impression(s) / ED Diagnoses Final diagnoses:  Somnolence    Rx / DC Orders ED Discharge Orders     None         Hayden Rasmussen, MD 08/04/21 [phone]

## 2021-08-04 NOTE — Discharge Instructions (Signed)
You were seen in the emergency department for altered mental status.  You had a CAT scan of your head chest x-ray blood work and urinalysis that did not show any significant abnormalities.  Your tiredness may be related to decreased amount of sleep or could be related to your sedating medications.  Please follow-up with your primary care doctor.  Return to the emergency department if any worsening or concerning symptoms.

## 2021-08-04 NOTE — ED Triage Notes (Signed)
Pt brought in by [facility] for AMS from [facility], per staff pt not herself at breakfast staring off, per EMS pt has pinpoint pupil, possibly taken to many pain meds.

## 2021-08-13 ENCOUNTER — Encounter (HOSPITAL_COMMUNITY): Payer: Self-pay | Admitting: Emergency Medicine

## 2021-08-13 ENCOUNTER — Observation Stay (HOSPITAL_COMMUNITY)
Admission: EM | Admit: 2021-08-13 | Discharge: 2021-08-14 | Disposition: A | Discharge status: Home / Self Care | Payer: Medicare Other | Attending: Family Medicine | Admitting: Family Medicine

## 2021-08-13 ENCOUNTER — Emergency Department (HOSPITAL_COMMUNITY): Payer: Medicare Other

## 2021-08-13 ENCOUNTER — Other Ambulatory Visit: Payer: Self-pay

## 2021-08-13 DIAGNOSIS — R109 Unspecified abdominal pain: Secondary | ICD-10-CM | POA: Diagnosis present

## 2021-08-13 DIAGNOSIS — J189 Pneumonia, unspecified organism: Principal | ICD-10-CM | POA: Insufficient documentation

## 2021-08-13 DIAGNOSIS — K5900 Constipation, unspecified: Secondary | ICD-10-CM | POA: Diagnosis present

## 2021-08-13 DIAGNOSIS — Z8673 Personal history of transient ischemic attack (TIA), and cerebral infarction without residual deficits: Secondary | ICD-10-CM | POA: Diagnosis not present

## 2021-08-13 DIAGNOSIS — Z8616 Personal history of COVID-19: Secondary | ICD-10-CM | POA: Diagnosis not present

## 2021-08-13 DIAGNOSIS — Z87891 Personal history of nicotine dependence: Secondary | ICD-10-CM | POA: Diagnosis not present

## 2021-08-13 DIAGNOSIS — F32A Depression, unspecified: Secondary | ICD-10-CM | POA: Diagnosis present

## 2021-08-13 DIAGNOSIS — K2930 Chronic superficial gastritis without bleeding: Secondary | ICD-10-CM

## 2021-08-13 DIAGNOSIS — F209 Schizophrenia, unspecified: Secondary | ICD-10-CM | POA: Diagnosis present

## 2021-08-13 DIAGNOSIS — Z86718 Personal history of other venous thrombosis and embolism: Secondary | ICD-10-CM | POA: Diagnosis not present

## 2021-08-13 DIAGNOSIS — E0590 Thyrotoxicosis, unspecified without thyrotoxic crisis or storm: Secondary | ICD-10-CM | POA: Diagnosis present

## 2021-08-13 DIAGNOSIS — Z7901 Long term (current) use of anticoagulants: Secondary | ICD-10-CM | POA: Insufficient documentation

## 2021-08-13 DIAGNOSIS — I6930 Unspecified sequelae of cerebral infarction: Secondary | ICD-10-CM

## 2021-08-13 DIAGNOSIS — Z7984 Long term (current) use of oral hypoglycemic drugs: Secondary | ICD-10-CM | POA: Diagnosis not present

## 2021-08-13 DIAGNOSIS — D509 Iron deficiency anemia, unspecified: Secondary | ICD-10-CM | POA: Diagnosis not present

## 2021-08-13 DIAGNOSIS — K219 Gastro-esophageal reflux disease without esophagitis: Secondary | ICD-10-CM | POA: Diagnosis present

## 2021-08-13 DIAGNOSIS — R531 Weakness: Secondary | ICD-10-CM | POA: Insufficient documentation

## 2021-08-13 DIAGNOSIS — E1165 Type 2 diabetes mellitus with hyperglycemia: Secondary | ICD-10-CM | POA: Diagnosis not present

## 2021-08-13 DIAGNOSIS — R0602 Shortness of breath: Secondary | ICD-10-CM | POA: Insufficient documentation

## 2021-08-13 DIAGNOSIS — G8194 Hemiplegia, unspecified affecting left nondominant side: Secondary | ICD-10-CM | POA: Diagnosis present

## 2021-08-13 DIAGNOSIS — I82409 Acute embolism and thrombosis of unspecified deep veins of unspecified lower extremity: Secondary | ICD-10-CM

## 2021-08-13 DIAGNOSIS — F39 Unspecified mood [affective] disorder: Secondary | ICD-10-CM | POA: Diagnosis present

## 2021-08-13 DIAGNOSIS — Z794 Long term (current) use of insulin: Secondary | ICD-10-CM | POA: Insufficient documentation

## 2021-08-13 DIAGNOSIS — Z20822 Contact with and (suspected) exposure to covid-19: Secondary | ICD-10-CM | POA: Insufficient documentation

## 2021-08-13 DIAGNOSIS — K5641 Fecal impaction: Secondary | ICD-10-CM | POA: Diagnosis not present

## 2021-08-13 DIAGNOSIS — I959 Hypotension, unspecified: Secondary | ICD-10-CM | POA: Diagnosis not present

## 2021-08-13 DIAGNOSIS — I10 Essential (primary) hypertension: Secondary | ICD-10-CM | POA: Diagnosis not present

## 2021-08-13 DIAGNOSIS — Z79899 Other long term (current) drug therapy: Secondary | ICD-10-CM | POA: Diagnosis not present

## 2021-08-13 DIAGNOSIS — I4891 Unspecified atrial fibrillation: Secondary | ICD-10-CM | POA: Insufficient documentation

## 2021-08-13 LAB — CBC WITH DIFFERENTIAL/PLATELET
Abs Immature Granulocytes: 0.05 10*3/uL (ref 0.00–0.07)
Basophils Absolute: 0 10*3/uL (ref 0.0–0.1)
Basophils Relative: 0 %
Eosinophils Absolute: 0 10*3/uL (ref 0.0–0.5)
Eosinophils Relative: 0 %
HCT: 33.6 % — ABNORMAL LOW (ref 36.0–46.0)
Hemoglobin: 10.7 g/dL — ABNORMAL LOW (ref 12.0–15.0)
Immature Granulocytes: 0 %
Lymphocytes Relative: 7 %
Lymphs Abs: 0.9 10*3/uL (ref 0.7–4.0)
MCH: 28.2 pg (ref 26.0–34.0)
MCHC: 31.8 g/dL (ref 30.0–36.0)
MCV: 88.7 fL (ref 80.0–100.0)
Monocytes Absolute: 0.5 10*3/uL (ref 0.1–1.0)
Monocytes Relative: 4 %
Neutro Abs: 10.8 10*3/uL — ABNORMAL HIGH (ref 1.7–7.7)
Neutrophils Relative %: 89 %
Platelets: 295 10*3/uL (ref 150–400)
RBC: 3.79 MIL/uL — ABNORMAL LOW (ref 3.87–5.11)
RDW: 15.2 % (ref 11.5–15.5)
WBC: 12.3 10*3/uL — ABNORMAL HIGH (ref 4.0–10.5)
nRBC: 0 % (ref 0.0–0.2)

## 2021-08-13 LAB — COMPREHENSIVE METABOLIC PANEL
ALT: 10 U/L (ref 0–44)
AST: 14 U/L — ABNORMAL LOW (ref 15–41)
Albumin: 3.2 g/dL — ABNORMAL LOW (ref 3.5–5.0)
Alkaline Phosphatase: 73 U/L (ref 38–126)
Anion gap: 7 (ref 5–15)
BUN: 12 mg/dL (ref 8–23)
CO2: 29 mmol/L (ref 22–32)
Calcium: 8.3 mg/dL — ABNORMAL LOW (ref 8.9–10.3)
Chloride: 100 mmol/L (ref 98–111)
Creatinine, Ser: 0.79 mg/dL (ref 0.44–1.00)
GFR, Estimated: >60 mL/min (ref >60)
Glucose, Bld: 80 mg/dL (ref 70–99)
Potassium: 3.8 mmol/L (ref 3.5–5.1)
Sodium: 136 mmol/L (ref 135–145)
Total Bilirubin: 0.5 mg/dL (ref 0.3–1.2)
Total Protein: 6.6 g/dL (ref 6.5–8.1)

## 2021-08-13 LAB — PHOSPHORUS: Phosphorus: 3.6 mg/dL (ref 2.5–4.6)

## 2021-08-13 LAB — PROCALCITONIN: Procalcitonin: <0.1 ng/mL

## 2021-08-13 LAB — RESP PANEL BY RT-PCR (FLU A&B, COVID) ARPGX2
Influenza A by PCR: NEGATIVE
Influenza B by PCR: NEGATIVE
SARS Coronavirus 2 by RT PCR: NEGATIVE

## 2021-08-13 LAB — CBG MONITORING, ED
Glucose-Capillary: 56 mg/dL — ABNORMAL LOW (ref 70–99)
Glucose-Capillary: 89 mg/dL (ref 70–99)

## 2021-08-13 LAB — BRAIN NATRIURETIC PEPTIDE: B Natriuretic Peptide: 297 pg/mL — ABNORMAL HIGH (ref 0.0–100.0)

## 2021-08-13 LAB — GLUCOSE, CAPILLARY
Glucose-Capillary: 176 mg/dL — ABNORMAL HIGH (ref 70–99)
Glucose-Capillary: 237 mg/dL — ABNORMAL HIGH (ref 70–99)

## 2021-08-13 LAB — PROTIME-INR
INR: 1.6 — ABNORMAL HIGH (ref 0.8–1.2)
Prothrombin Time: 18.4 seconds — ABNORMAL HIGH (ref 11.4–15.2)

## 2021-08-13 LAB — HIV ANTIBODY (ROUTINE TESTING W REFLEX): HIV Screen 4th Generation wRfx: NONREACTIVE

## 2021-08-13 LAB — MAGNESIUM: Magnesium: 2.2 mg/dL (ref 1.7–2.4)

## 2021-08-13 MED ORDER — CLONAZEPAM 0.5 MG PO TABS
0.5000 mg | ORAL_TABLET | Freq: Three times a day (TID) | ORAL | Status: DC | PRN
  Administered 2021-08-13 – 2021-08-14 (×3): 0.5 mg via ORAL
  Filled 2021-08-13 (×3): qty 1

## 2021-08-13 MED ORDER — NALOXEGOL OXALATE 12.5 MG PO TABS
12.5000 mg | ORAL_TABLET | Freq: Every day | ORAL | Status: DC
  Administered 2021-08-14: 12.5 mg via ORAL
  Filled 2021-08-13 (×2): qty 1

## 2021-08-13 MED ORDER — AMIODARONE HCL 200 MG PO TABS
200.0000 mg | ORAL_TABLET | Freq: Every day | ORAL | Status: DC
  Administered 2021-08-13 – 2021-08-14 (×2): 200 mg via ORAL
  Filled 2021-08-13 (×2): qty 1

## 2021-08-13 MED ORDER — MIDODRINE HCL 5 MG PO TABS
10.0000 mg | ORAL_TABLET | Freq: Three times a day (TID) | ORAL | Status: DC
Start: 2021-08-13 — End: 2021-08-15
  Administered 2021-08-13 – 2021-08-14 (×4): 10 mg via ORAL
  Filled 2021-08-13 (×4): qty 2

## 2021-08-13 MED ORDER — RISPERIDONE 1 MG PO TABS
2.0000 mg | ORAL_TABLET | Freq: Every day | ORAL | Status: DC
  Administered 2021-08-13: 2 mg via ORAL
  Filled 2021-08-13: qty 2

## 2021-08-13 MED ORDER — SODIUM CHLORIDE 0.9 % IV SOLN
500.0000 mg | INTRAVENOUS | Status: DC
  Administered 2021-08-14: 500 mg via INTRAVENOUS
  Filled 2021-08-13: qty 5

## 2021-08-13 MED ORDER — ATORVASTATIN CALCIUM 40 MG PO TABS
40.0000 mg | ORAL_TABLET | Freq: Every day | ORAL | Status: DC
Start: 2021-08-13 — End: 2021-08-15
  Administered 2021-08-13: 40 mg via ORAL
  Filled 2021-08-13: qty 1

## 2021-08-13 MED ORDER — HYDROMORPHONE HCL 1 MG/ML IJ SOLN: 0.5000 mg | INTRAMUSCULAR | Status: DC | PRN

## 2021-08-13 MED ORDER — LATANOPROST 0.005 % OP SOLN
1.0000 [drp] | Freq: Every day | OPHTHALMIC | Status: DC
  Administered 2021-08-13: 1 [drp] via OPHTHALMIC
  Filled 2021-08-13: qty 2.5

## 2021-08-13 MED ORDER — RISPERIDONE 1 MG PO TABS
1.0000 mg | ORAL_TABLET | Freq: Every day | ORAL | Status: DC
Start: 2021-08-14 — End: 2021-08-15
  Administered 2021-08-14: 1 mg via ORAL
  Filled 2021-08-13: qty 1

## 2021-08-13 MED ORDER — IOHEXOL 300 MG/ML  SOLN
100.0000 mL | Freq: Once | INTRAMUSCULAR | Status: AC | PRN
  Administered 2021-08-13: 100 mL via INTRAVENOUS

## 2021-08-13 MED ORDER — APIXABAN 5 MG PO TABS
5.0000 mg | ORAL_TABLET | Freq: Two times a day (BID) | ORAL | Status: DC
  Administered 2021-08-13 – 2021-08-14 (×2): 5 mg via ORAL
  Filled 2021-08-13 (×2): qty 1

## 2021-08-13 MED ORDER — SODIUM CHLORIDE 0.9% FLUSH: 3.0000 mL | INTRAVENOUS | Status: DC | PRN

## 2021-08-13 MED ORDER — MIDODRINE HCL 5 MG PO TABS: 10.0000 mg | ORAL_TABLET | Freq: Three times a day (TID) | ORAL | Status: DC

## 2021-08-13 MED ORDER — ONDANSETRON HCL 4 MG/2ML IJ SOLN: 4.0000 mg | Freq: Four times a day (QID) | INTRAMUSCULAR | Status: DC | PRN

## 2021-08-13 MED ORDER — SENNOSIDES 8.8 MG/5ML PO SYRP
15.0000 mL | ORAL_SOLUTION | Freq: Two times a day (BID) | ORAL | Status: DC
Start: 2021-08-13 — End: 2021-08-13

## 2021-08-13 MED ORDER — OXYCODONE HCL 5 MG PO TABS
5.0000 mg | ORAL_TABLET | ORAL | Status: DC | PRN
  Administered 2021-08-13 – 2021-08-14 (×3): 5 mg via ORAL
  Filled 2021-08-13 (×3): qty 1

## 2021-08-13 MED ORDER — MINERAL OIL RE ENEM
1.0000 | ENEMA | Freq: Once | RECTAL | Status: AC
  Administered 2021-08-13: 1 via RECTAL
  Filled 2021-08-13: qty 1

## 2021-08-13 MED ORDER — POLYETHYLENE GLYCOL 3350 17 G PO PACK
17.0000 g | PACK | Freq: Two times a day (BID) | ORAL | Status: DC
  Administered 2021-08-13 – 2021-08-14 (×3): 17 g via ORAL
  Filled 2021-08-13 (×3): qty 1

## 2021-08-13 MED ORDER — SODIUM CHLORIDE 0.9% FLUSH
3.0000 mL | Freq: Two times a day (BID) | INTRAVENOUS | Status: DC
  Administered 2021-08-13: 3 mL via INTRAVENOUS

## 2021-08-13 MED ORDER — TRAZODONE HCL 50 MG PO TABS
25.0000 mg | ORAL_TABLET | Freq: Every evening | ORAL | Status: DC | PRN
  Administered 2021-08-13: 25 mg via ORAL
  Filled 2021-08-13: qty 1

## 2021-08-13 MED ORDER — OXYBUTYNIN CHLORIDE ER 5 MG PO TB24
5.0000 mg | ORAL_TABLET | Freq: Every day | ORAL | Status: DC
  Administered 2021-08-14: 5 mg via ORAL
  Filled 2021-08-13: qty 1

## 2021-08-13 MED ORDER — SODIUM CHLORIDE 0.9% FLUSH: 3.0000 mL | Freq: Two times a day (BID) | INTRAVENOUS | Status: DC

## 2021-08-13 MED ORDER — SODIUM CHLORIDE 0.9 % IV SOLN: 250.0000 mL | INTRAVENOUS | Status: DC | PRN

## 2021-08-13 MED ORDER — SENNA 8.6 MG PO TABS
1.0000 | ORAL_TABLET | Freq: Two times a day (BID) | ORAL | Status: DC
  Administered 2021-08-13 – 2021-08-14 (×2): 8.6 mg via ORAL
  Filled 2021-08-13 (×2): qty 1

## 2021-08-13 MED ORDER — INSULIN DETEMIR 100 UNIT/ML ~~LOC~~ SOLN
22.0000 [IU] | Freq: Every day | SUBCUTANEOUS | Status: DC
  Filled 2021-08-13: qty 0.22

## 2021-08-13 MED ORDER — BOOST / RESOURCE BREEZE PO LIQD CUSTOM
1.0000 | Freq: Three times a day (TID) | ORAL | Status: DC
  Administered 2021-08-13 – 2021-08-14 (×2): 1 via ORAL

## 2021-08-13 MED ORDER — BISACODYL 5 MG PO TBEC: 5.0000 mg | DELAYED_RELEASE_TABLET | Freq: Every day | ORAL | Status: DC | PRN

## 2021-08-13 MED ORDER — SODIUM CHLORIDE 0.9 % IV SOLN: INTRAVENOUS | Status: DC

## 2021-08-13 MED ORDER — ARIPIPRAZOLE 2 MG PO TABS
2.0000 mg | ORAL_TABLET | Freq: Every day | ORAL | Status: DC
  Administered 2021-08-13: 2 mg via ORAL
  Filled 2021-08-13 (×3): qty 1

## 2021-08-13 MED ORDER — ONDANSETRON HCL 4 MG PO TABS: 4.0000 mg | ORAL_TABLET | Freq: Four times a day (QID) | ORAL | Status: DC | PRN

## 2021-08-13 MED ORDER — ACETAMINOPHEN 325 MG PO TABS
650.0000 mg | ORAL_TABLET | Freq: Four times a day (QID) | ORAL | Status: DC | PRN
  Administered 2021-08-13: 650 mg via ORAL
  Filled 2021-08-13: qty 2

## 2021-08-13 MED ORDER — SODIUM CHLORIDE 0.9 % IV SOLN
2.0000 g | INTRAVENOUS | Status: DC
  Administered 2021-08-14: 2 g via INTRAVENOUS
  Filled 2021-08-13: qty 20

## 2021-08-13 MED ORDER — SODIUM CHLORIDE 0.9 % IV SOLN
500.0000 mg | Freq: Once | INTRAVENOUS | Status: AC
  Administered 2021-08-13: 500 mg via INTRAVENOUS
  Filled 2021-08-13: qty 5

## 2021-08-13 MED ORDER — METOPROLOL SUCCINATE ER 50 MG PO TB24
50.0000 mg | ORAL_TABLET | Freq: Every day | ORAL | Status: DC
  Administered 2021-08-14: 50 mg via ORAL
  Filled 2021-08-13: qty 1

## 2021-08-13 MED ORDER — SODIUM CHLORIDE 0.9 % IV SOLN
1.0000 g | Freq: Once | INTRAVENOUS | Status: AC
  Administered 2021-08-13: 1 g via INTRAVENOUS
  Filled 2021-08-13: qty 10

## 2021-08-13 MED ORDER — ALUM & MAG HYDROXIDE-SIMETH 200-200-20 MG/5ML PO SUSP
15.0000 mL | ORAL | Status: DC | PRN
  Administered 2021-08-13: 15 mL via ORAL
  Filled 2021-08-13: qty 30

## 2021-08-13 MED ORDER — IPRATROPIUM BROMIDE 0.02 % IN SOLN: 0.5000 mg | Freq: Four times a day (QID) | RESPIRATORY_TRACT | Status: DC | PRN

## 2021-08-13 MED ORDER — SODIUM CHLORIDE 0.9 % IV SOLN: INTRAVENOUS | Status: AC

## 2021-08-13 MED ORDER — SENNOSIDES-DOCUSATE SODIUM 8.6-50 MG PO TABS: 1.0000 | ORAL_TABLET | Freq: Every evening | ORAL | Status: DC | PRN

## 2021-08-13 MED ORDER — INSULIN DETEMIR 100 UNIT/ML ~~LOC~~ SOLN
10.0000 [IU] | Freq: Every day | SUBCUTANEOUS | Status: DC
  Filled 2021-08-13: qty 0.1

## 2021-08-13 MED ORDER — LIDOCAINE HCL URETHRAL/MUCOSAL 2 % EX GEL
1.0000 | Freq: Once | CUTANEOUS | Status: AC
  Administered 2021-08-13: 1 via TOPICAL
  Filled 2021-08-13: qty 10

## 2021-08-13 MED ORDER — DILTIAZEM HCL ER COATED BEADS 120 MG PO CP24
120.0000 mg | ORAL_CAPSULE | Freq: Every day | ORAL | Status: DC
  Administered 2021-08-14: 120 mg via ORAL
  Filled 2021-08-13: qty 1

## 2021-08-13 MED ORDER — FLEET ENEMA 7-19 GM/118ML RE ENEM
1.0000 | ENEMA | Freq: Once | RECTAL | Status: AC | PRN
  Administered 2021-08-13: 1 via RECTAL

## 2021-08-13 MED ORDER — MIRTAZAPINE 30 MG PO TABS
30.0000 mg | ORAL_TABLET | Freq: Every day | ORAL | Status: DC
  Administered 2021-08-13: 30 mg via ORAL
  Filled 2021-08-13: qty 1

## 2021-08-13 MED ORDER — INSULIN ASPART 100 UNIT/ML IJ SOLN
0.0000 [IU] | Freq: Three times a day (TID) | INTRAMUSCULAR | Status: DC
  Administered 2021-08-13 – 2021-08-14 (×4): 3 [IU] via SUBCUTANEOUS

## 2021-08-13 MED ORDER — HYDRALAZINE HCL 20 MG/ML IJ SOLN: 10.0000 mg | INTRAMUSCULAR | Status: DC | PRN

## 2021-08-13 MED ORDER — PANTOPRAZOLE SODIUM 40 MG PO TBEC
40.0000 mg | DELAYED_RELEASE_TABLET | Freq: Every day | ORAL | Status: DC
  Administered 2021-08-13 – 2021-08-14 (×2): 40 mg via ORAL
  Filled 2021-08-13 (×2): qty 1

## 2021-08-13 MED ORDER — MINERAL OIL RE ENEM
1.0000 | ENEMA | Freq: Once | RECTAL | Status: AC
  Administered 2021-08-13: 1 via RECTAL

## 2021-08-13 MED ORDER — HEPARIN SODIUM (PORCINE) 5000 UNIT/ML IJ SOLN: 5000.0000 [IU] | Freq: Three times a day (TID) | INTRAMUSCULAR | Status: DC

## 2021-08-13 MED ORDER — ACETAMINOPHEN 650 MG RE SUPP: 650.0000 mg | Freq: Four times a day (QID) | RECTAL | Status: DC | PRN

## 2021-08-13 MED ORDER — LEVALBUTEROL HCL 0.63 MG/3ML IN NEBU: 0.6300 mg | INHALATION_SOLUTION | Freq: Four times a day (QID) | RESPIRATORY_TRACT | Status: DC | PRN

## 2021-08-13 NOTE — Assessment & Plan Note (Signed)
Continue Eliquis, statins -Hemiplegia chronic-stable

## 2021-08-13 NOTE — ED Notes (Signed)
No results from enema at this time, will reassess.

## 2021-08-13 NOTE — Assessment & Plan Note (Signed)
Rate well controlled  -continue home medication of amiodarone and Cardizem -Continue Eliquis

## 2021-08-13 NOTE — Assessment & Plan Note (Signed)
-   continue PPI

## 2021-08-13 NOTE — ED Provider Notes (Signed)
[facility] EMERGENCY DEPARTMENT Provider Note   CSN: [id number] Arrival date & time: 08/13/21  5284     History  Chief Complaint  Patient presents with   Abdominal Pain    [name] is a 76 y.o. female.  HPI 76 year old female presents with constipation.  Last bowel movement was 7-8 days ago.  She has been on MiraLAX for about a month.  No vomiting and occasionally she is passing gas.  Today she developed some lower abdominal pain.  No urinary symptoms.  No chest pain.  She states she has been short of breath for about a month.  Home Medications Prior to Admission medications   Medication Sig Start Date End Date Taking? Authorizing Provider  ABILIFY 2 MG tablet Take 1 tablet (2 mg total) by mouth daily. Patient taking differently: Take 2 mg by mouth at bedtime. 11/09/20  Yes [name], MD  acetaminophen (TYLENOL) 325 MG tablet Take 650 mg by mouth 3 (three) times daily as needed for mild pain or moderate pain.    Yes [provider]  albuterol (VENTOLIN HFA) 108 (90 Base) MCG/ACT inhaler Inhale 2 puffs into the lungs every 6 (six) hours as needed for wheezing or shortness of breath. 05/20/21  Yes Barton Dubois, MD  amiodarone (PACERONE) 200 MG tablet Take 1 tablet (200 mg total) by mouth daily. 04/24/21  Yes Barton Dubois, MD  apixaban (ELIQUIS) 5 MG TABS tablet Take 1 tablet (5 mg total) by mouth 2 (two) times daily. 02/21/20  Yes Richarda Osmond, MD  atorvastatin (LIPITOR) 40 MG tablet Take 1 tablet by mouth at bedtime. 05/30/20  Yes [provider]  busPIRone (BUSPAR) 7.5 MG tablet Take 7.5 mg by mouth 2 (two) times daily. 07/07/21  Yes [provider]  calcium carbonate (TUMS - DOSED IN MG ELEMENTAL CALCIUM) 500 MG chewable tablet Chew 1 tablet by mouth 4 (four) times daily as needed for heartburn.   Yes [provider]  clonazePAM (KLONOPIN) 0.5 MG tablet Take 1 tablet (0.5 mg total) by mouth 3 (three) times daily as needed for  anxiety. Patient taking differently: Take 0.5 mg by mouth daily. 05/20/21  Yes Barton Dubois, MD  diltiazem (CARDIZEM CD) 120 MG 24 hr capsule Take 120 mg by mouth daily. 05/30/20  Yes [provider]  DULoxetine (CYMBALTA) 60 MG capsule Take 120 mg by mouth daily.   Yes [provider]  gabapentin (NEURONTIN) 300 MG capsule Take 1 capsule (300 mg total) by mouth every 8 (eight) hours as needed (pain). 05/20/21  Yes Barton Dubois, MD  GOODSENSE ARTIFICIAL TEARS 0.5-0.6 % SOLN Apply to eye. 06/03/21  Yes [provider]  HYDROcodone-acetaminophen (NORCO) 10-325 MG tablet Take 1 tablet by mouth every 8 (eight) hours as needed for severe pain. Patient taking differently: Take 1 tablet by mouth in the morning, at noon, and at bedtime. 05/20/21  Yes Barton Dubois, MD  insulin detemir (LEVEMIR FLEXTOUCH) 100 UNIT/ML FlexPen INJECT 22 UNITS SUBCUTANEOUSLY AT BEDTIME.(HOLD IF BS<70: CALL MD IF BS> 400) Patient taking differently: 22 Units at bedtime. 07/15/21  Yes Reardon, Juanetta Beets, NP  latanoprost (XALATAN) 0.005 % ophthalmic solution Place 1 drop into both eyes at bedtime.   Yes [provider]  loratadine (CLARITIN) 10 MG tablet Take 10 mg by mouth daily.   Yes [provider]  lubiprostone (AMITIZA) 8 MCG capsule Take 1 capsule by mouth daily. 08/10/21  Yes [provider]  metoCLOPramide (REGLAN) 5 MG tablet Take 5  mg by mouth in the morning, at noon, and at bedtime. If zofran is not working   Yes [provider]  metoprolol succinate (TOPROL-XL) 50 MG 24 hr tablet Take 1 tablet (50 mg total) by mouth daily. Take with or immediately following a meal. 04/01/21  Yes Tat, Shanon Brow, MD  midodrine (PROAMATINE) 10 MG tablet Take 10 mg by mouth 3 (three) times daily.   Yes [provider]  mirtazapine (REMERON) 30 MG tablet Take 30 mg by mouth at bedtime.   Yes [provider]  naloxegol oxalate (MOVANTIK) 12.5 MG TABS tablet Take 1  tablet (12.5 mg total) by mouth daily. 04/24/21  Yes Barton Dubois, MD  NOVOLOG FLEXPEN 100 UNIT/ML FlexPen INJECT SUBCUTANEOUSLY AS FOLLOWS WITH MEALS: 90-150=10u: 151-200=11u: 201-250=12u: 251-300=13u: 301-350=14u: 351-400=15u: BS>400=16u & CALL MD. Patient taking differently: 10-16 Units 3 (three) times daily with meals. INJECT SUBCUTANEOUSLY AS FOLLOWS WITH MEALS: 90-150=10u: 151-200=11u: 201-250=12u: 251-300=13u: 301-350=14u: 351-400=15u: BS>400=16u & CALL MD. 02/28/21  Yes Reardon, Juanetta Beets, NP  ondansetron (ZOFRAN) 4 MG tablet Take 4 mg by mouth 2 (two) times daily. 06/24/21  Yes [provider]  oxybutynin (DITROPAN-XL) 5 MG 24 hr tablet Take 5 mg by mouth daily. 04/20/21  Yes [provider]  pantoprazole (PROTONIX) 40 MG tablet Take 1 tablet (40 mg total) by mouth 2 (two) times daily before a meal. 11/17/20  Yes Mahala Menghini, PA-C  polyethylene glycol (MIRALAX / GLYCOLAX) 17 g packet Take 17 g by mouth daily. 04/24/21  Yes Barton Dubois, MD  risperiDONE (RISPERDAL) 1 MG tablet Take 1-2 mg by mouth 2 (two) times daily. Take 1 tablet by mouth in the morning. Take 2 tablets by mouth at bedtime. 04/04/21  Yes [provider]  SANTYL 250 UNIT/GM ointment Apply 1 Application topically daily. 07/05/21  Yes [provider]  senna (SENOKOT) 8.6 MG tablet Take 1 tablet by mouth daily.   Yes [provider]  Skin Protectants, Misc. (BAZA PROTECT EX) Apply 1 Application topically in the morning and at bedtime. 08/02/21  Yes [provider]  TRADJENTA 5 MG TABS tablet TAKE (1) TABLET BY MOUTH ONCE DAILY. Patient taking differently: Take 5 mg by mouth daily. 04/04/21  Yes Brita Romp, NP  EASYMAX TEST test strip USE TO CHECK BLOOD SUGAR FOUR TIMES DAILY.(HOLD IF BS<70: CALL MD IF BS> 400) 07/21/21   Brita Romp, NP      Allergies    Sulfa antibiotics    Review of Systems   Review of Systems  Respiratory:  Positive for shortness of breath.    Cardiovascular:  Negative for chest pain.  Gastrointestinal:  Positive for abdominal pain and constipation. Negative for vomiting.  Genitourinary:  Negative for dysuria.    Physical Exam Updated Vital Signs BP 120/68   Pulse 69   Temp 97.8 F (36.6 C) (Axillary)   Resp 17   Ht '5\' 6"'$  (1.676 m)   Wt 63.3 kg   SpO2 94%   BMI 22.52 kg/m  Physical Exam Vitals and nursing note reviewed. Exam conducted with a chaperone present.  Constitutional:      Appearance: She is well-developed.  HENT:     Head: Normocephalic and atraumatic.  Cardiovascular:     Rate and Rhythm: Normal rate and regular rhythm.     Heart sounds: Normal heart sounds.  Pulmonary:     Effort: Pulmonary effort is normal.     Breath sounds: Normal breath sounds.  Abdominal:  Palpations: Abdomen is soft.     Tenderness: There is abdominal tenderness in the right lower quadrant and left lower quadrant.  Genitourinary:    Comments: Fecal impaction with firm/hard stool.  No gross blood. Skin:    General: Skin is warm and dry.  Neurological:     Mental Status: She is alert.     ED Results / Procedures / Treatments   Labs (all labs ordered are listed, but only abnormal results are displayed) Labs Reviewed  COMPREHENSIVE METABOLIC PANEL - Abnormal; Notable for the following components:      Result Value   Calcium 8.3 (*)    Albumin 3.2 (*)    AST 14 (*)    All other components within normal limits  CBC WITH DIFFERENTIAL/PLATELET - Abnormal; Notable for the following components:   WBC 12.3 (*)    RBC 3.79 (*)    Hemoglobin 10.7 (*)    HCT 33.6 (*)    Neutro Abs 10.8 (*)    All other components within normal limits  BRAIN NATRIURETIC PEPTIDE - Abnormal; Notable for the following components:   B Natriuretic Peptide 297.0 (*)    All other components within normal limits  PROTIME-INR - Abnormal; Notable for the following components:   Prothrombin Time 18.4 (*)    INR 1.6 (*)    All other components  within normal limits  CBG MONITORING, ED - Abnormal; Notable for the following components:   Glucose-Capillary 56 (*)    All other components within normal limits  RESP PANEL BY RT-PCR (FLU A&B, COVID) ARPGX2  CULTURE, BLOOD (ROUTINE X 2)  CULTURE, BLOOD (ROUTINE X 2)  EXPECTORATED SPUTUM ASSESSMENT W GRAM STAIN, RFLX TO RESP C  MAGNESIUM  PHOSPHORUS  HIV ANTIBODY (ROUTINE TESTING W REFLEX)  INFLUENZA PANEL BY PCR (TYPE A & B)  PROCALCITONIN  CBG MONITORING, ED    EKG EKG Interpretation  Date/Time:  Saturday August 13 2021 10:38:56 EDT Ventricular Rate:  73 PR Interval:    QRS Duration: 95 QT Interval:  418 QTC Calculation: 461 R Axis:   80 Text Interpretation: Atrial flutter with predominant 4:1 AV block Ventricular premature complex Confirmed by Sherwood Gambler [phone]) on 08/13/2021 10:44:57 AM  Radiology CT ABDOMEN PELVIS W CONTRAST  Result Date: 08/13/2021 CLINICAL DATA:  Abdominal pain, constipation EXAM: CT ABDOMEN AND PELVIS WITH CONTRAST TECHNIQUE: Multidetector CT imaging of the abdomen and pelvis was performed using the standard protocol following bolus administration of intravenous contrast. RADIATION DOSE REDUCTION: This exam was performed according to the departmental dose-optimization program which includes automated exposure control, adjustment of the mA and/or kV according to patient size and/or use of iterative reconstruction technique. CONTRAST:  153m OMNIPAQUE IOHEXOL 300 MG/ML  SOLN COMPARISON:  07/21/2021 FINDINGS: Lower chest: There are new patchy interstitial and alveolar densities are seen in lower lung fields. There is calcified nodule in right lower lung field. There is minimal right pleural effusion. Hepatobiliary: There is 2.8 cm hemangioma in the right lobe of liver. There is no dilation of bile ducts. Gallbladder stone is seen. There is mild diffuse wall thickening in gallbladder. Pancreas: There is pancreatic atrophy. No focal abnormalities are seen.  Spleen: Unremarkable. Adrenals/Urinary Tract: Adrenals are unremarkable. There is no hydronephrosis. There are no renal or ureteral stones. Urinary bladder is unremarkable. Stomach/Bowel: Moderate-sized hiatal hernia is seen. There is wall thickening in the lower thoracic esophagus. Stomach is not distended. Small bowel loops are not dilated. Appendix is not dilated. Cecum is higher and more  medial than usual in position. Liquid stool is seen in right colon. Moderate to large amount of stool is seen in left colon. There is wall thickening in rectum along with mild perirectal stranding. Vascular/Lymphatic: Scattered calcifications are seen in aorta and its major branches. Reproductive: Unremarkable. Other: There is no ascites or pneumoperitoneum. Musculoskeletal: There is anterolisthesis at the L4-L5 level along with marked spinal stenosis and encroachment of neural foramina. IMPRESSION: There is mild diffuse wall thickening in rectum. Possibility of stercoral colitis should be considered. Gallbladder stones. There is wall thickening in gallbladder. Possibility of acute cholecystitis is not excluded. If clinically warranted, gallbladder sonogram may be considered. There is no evidence of small-bowel obstruction or pneumoperitoneum. There is no hydronephrosis. There is interval appearance of increased interstitial markings in both lower lung fields suggesting possible interstitial pneumonia. There is small right pleural effusion. There is moderate sized hiatal hernia. There is wall thickening in lower thoracic esophagus suggesting possible reflux esophagitis. Lumbar spondylosis with marked spinal stenosis at the L4-L5 level. Possible hemangioma is seen in liver. Other findings as described in the body of the report. Electronically Signed   By: Elmer Picker M.D.   On: 08/13/2021 13:03   DG Chest 2 View  Result Date: 08/13/2021 CLINICAL DATA:  Dyspnea for 1 month. EXAM: CHEST - 2 VIEW COMPARISON:  08/04/21  FINDINGS: Unchanged cardiac enlargement. Small bilateral pleural effusions are identified with veil like opacification of the lower lung zones. Pulmonary vascular congestion without frank edema. No airspace opacities. Spondylosis noted within the thoracic spine. Mild compression deformities within the lower thoracic spine suspected but difficult to visualize due to patient positioning and overlying soft tissues. IMPRESSION: Cardiac enlargement with small bilateral pleural effusions and pulmonary vascular congestion. Electronically Signed   By: Kerby Moors M.D.   On: 08/13/2021 10:43    Procedures Fecal disimpaction  Date/Time: 08/13/2021 11:23 AM  Performed by: Sherwood Gambler, MD Authorized by: Sherwood Gambler, MD  Consent: Verbal consent obtained. Local anesthesia used: yes  Anesthesia: Local anesthesia used: yes Local Anesthetic: topical anesthetic  Sedation: Patient sedated: no  Patient tolerance: patient tolerated the procedure well with no immediate complications Comments: Large amount of firm, brown stool without blood disimpacted from rectum.       Medications Ordered in ED Medications  sodium chloride flush (NS) 0.9 % injection 3 mL (has no administration in time range)  acetaminophen (TYLENOL) tablet 650 mg (has no administration in time range)    Or  acetaminophen (TYLENOL) suppository 650 mg (has no administration in time range)  oxyCODONE (Oxy IR/ROXICODONE) immediate release tablet 5 mg (has no administration in time range)  HYDROmorphone (DILAUDID) injection 0.5-1 mg (has no administration in time range)  traZODone (DESYREL) tablet 25 mg (has no administration in time range)  sodium phosphate (FLEET) 7-19 GM/118ML enema 1 enema (has no administration in time range)  ondansetron (ZOFRAN) tablet 4 mg (has no administration in time range)    Or  ondansetron (ZOFRAN) injection 4 mg (has no administration in time range)  ipratropium (ATROVENT) nebulizer solution 0.5  mg (has no administration in time range)  levalbuterol (XOPENEX) nebulizer solution 0.63 mg (has no administration in time range)  cefTRIAXone (ROCEPHIN) 2 g in sodium chloride 0.9 % 100 mL IVPB (has no administration in time range)  azithromycin (ZITHROMAX) 500 mg in sodium chloride 0.9 % 250 mL IVPB (has no administration in time range)  polyethylene glycol (MIRALAX / GLYCOLAX) packet 17 g (has no administration in time range)  mineral oil enema 1 enema (has no administration in time range)  amiodarone (PACERONE) tablet 200 mg (has no administration in time range)  atorvastatin (LIPITOR) tablet 40 mg (has no administration in time range)  diltiazem (CARDIZEM CD) 24 hr capsule 120 mg (has no administration in time range)  metoprolol succinate (TOPROL-XL) 24 hr tablet 50 mg (has no administration in time range)  naloxegol oxalate (MOVANTIK) tablet 12.5 mg (has no administration in time range)  oxybutynin (DITROPAN-XL) 24 hr tablet 5 mg (has no administration in time range)  apixaban (ELIQUIS) tablet 5 mg (has no administration in time range)  clonazePAM (KLONOPIN) tablet 0.5 mg (has no administration in time range)  latanoprost (XALATAN) 0.005 % ophthalmic solution 1 drop (has no administration in time range)  risperiDONE (RISPERDAL) tablet 2 mg (has no administration in time range)  ARIPiprazole (ABILIFY) tablet 2 mg (has no administration in time range)  senna (SENOKOT) tablet 8.6 mg (has no administration in time range)  midodrine (PROAMATINE) tablet 10 mg (has no administration in time range)  risperiDONE (RISPERDAL) tablet 1 mg (has no administration in time range)  mirtazapine (REMERON) tablet 30 mg (has no administration in time range)  insulin aspart (novoLOG) injection 0-15 Units (has no administration in time range)  0.9 %  sodium chloride infusion (has no administration in time range)  insulin detemir (LEVEMIR) injection 10 Units (has no administration in time range)  lidocaine  (XYLOCAINE) 2 % jelly 1 Application (1 Application Topical Given 08/13/21 1100)  mineral oil enema 1 enema (1 enema Rectal Given 08/13/21 1132)  iohexol (OMNIPAQUE) 300 MG/ML solution 100 mL (100 mLs Intravenous Contrast Given 08/13/21 1238)  cefTRIAXone (ROCEPHIN) 1 g in sodium chloride 0.9 % 100 mL IVPB (0 g Intravenous Stopped 08/13/21 1442)  azithromycin (ZITHROMAX) 500 mg in sodium chloride 0.9 % 250 mL IVPB (500 mg Intravenous New Bag/Given 08/13/21 1442)    ED Course/ Medical Decision Making/ A&P                           Medical Decision Making Amount and/or Complexity of Data Reviewed Labs: ordered. Radiology: ordered.  Risk OTC drugs. Prescription drug management. Decision regarding hospitalization.   Patient is found to have fecal impaction.  Despite a large amount of stool being removed, and then an enema being placed, she still has not had further bowel movement.  CT was obtained due to this and there seems to be Stercoral colitis.  I think she will need better bowel regimen and will probably need hospitalization.  She has some soft blood pressures though is also chronically on midodrine.  She does not appear fluid overloaded on exam with no leg edema and has a dry mouth so I suspect she is actually probably a little dry.  CT does have concern for possible pneumonia and so we will start on antibiotics after blood cultures.  Discussed with the hospitalist for admission.        Final Clinical Impression(s) / ED Diagnoses Final diagnoses:  Fecal impaction ([place])  Pneumonia of both lungs due to infectious organism, unspecified part of lung    Rx / DC Orders ED Discharge Orders     None         Sherwood Gambler, MD 08/13/21 1556

## 2021-08-13 NOTE — ED Triage Notes (Signed)
Patient brought in via EMS from [place] home. Alert and oriented per norma. Patient c/o right upper and lower abd pain due to constipation. Denies any nausea, vomiting, fevers, or urinary symptoms. Patient reports last BM 7-8 days ago with normal BM being every day. Denies any rectal bleeding. Patient taking Mirilax daily with no results.

## 2021-08-13 NOTE — Assessment & Plan Note (Signed)
Stable continue current home medication with exception of BuSpar and Cymbalta, resuming Abilify Remeron and risperidone

## 2021-08-13 NOTE — Assessment & Plan Note (Signed)
Stable

## 2021-08-13 NOTE — Assessment & Plan Note (Signed)
-   Attempted fecal impaction by ED physician and enema -Continue mineral oil enema, MiraLAX and Senokot laxative orally -CT scan reviewed continue to have stool burden in colon -Patient has had successful for bowel movement in past 12 hours

## 2021-08-13 NOTE — Assessment & Plan Note (Signed)
-   No signs of sepsis chronic hypotension likely due to rate control medication of Cardizem, amiodarone, and metoprolol -Continue home medication of midodrine 10 mg p.o. 3 times daily -Status post gentle IV fluid hydration

## 2021-08-13 NOTE — Assessment & Plan Note (Signed)
-   PT /OT evaluation, fall precaution

## 2021-08-13 NOTE — Assessment & Plan Note (Signed)
-   We will check her CBG q. ACH S, SSI coverage, along with her long-acting insulin of 22 units of Levemir, holding home medication of Tradjenta

## 2021-08-13 NOTE — Progress Notes (Signed)
Pt arrived to room 338 via stretcher from the ED. Received report from [place], Therapist, sports. See assessment. Will continue to monitor.

## 2021-08-13 NOTE — Hospital Course (Addendum)
[name] is a 70 Yyo.F from [place], with history of IBS chronic constipation, schizophrenia, mood disorder, DM2, HTN, HLD, DVTs, GERD, CVA with hemiparesis, A-fib (on Eliquis)   Presented abdominal pain, with constipation, fecal impaction, and incidental finding of pneumonia. Patient reported that her last bowel movement was 7-8 days ago with normal BM.  Denies any rectal bleeding Patient has been taking MiraLAX daily with no results   ED course: Blood pressure 117/63, pulse 71, temperature 97.8 F (36.6 C), temperature source Axillary, resp. rate 17, height '5\' 6"'$  (1.676 m), weight 56.2 kg, SpO2 92 %.  LABs: CMP within normal limits, BNP 297, WBC 12.3, Pending respiratory panel,  CT abdomen pelvis: There is mild diffuse wall thickening in rectum. Possibility of stercoral colitis should be considered.   There is interval appearance of increased interstitial markings in both lower lung fields suggesting possible interstitial pneumonia. There is small right pleural effusion.   There is moderate sized hiatal hernia. There is wall thickening in lower thoracic esophagus suggesting possible reflux esophagitis.   Liquid stool is seen in right colon. Moderate to large amount of stool is seen in left colon. There is wall thickening in rectum along with mild perirectal stranding.    Chest x-ray: Cardiac enlargement with small bilateral pleural effusions and pulmonary vascular congestion.  Partial disimpaction was attempted in ED prior to CT scan obtaining Concerning for further disimpaction and treatment for pneumonia  Asked for patient to be admitted.   Chart review inside information:  Admission on 07/21/2021-for intractable nausea vomiting debris in esophagus moderate dilatation concerning for esophageal stricture stool burden in colon EGD 05/17/2016 for esophageal stenosis, small hiatal hernia gastric polyp chronic gastritis nonbleeding duodenal  diverticulum Admission 4/26-for pneumonia was discharged with Augmentin EGD 07/23/2021 -debris/fluid in esophagus, retained food was removed distal esophageal stricture was noted -patient developed A-fib with RVR with hypotension procedure was abandoned, esophageal dilatation was not performed.

## 2021-08-13 NOTE — Assessment & Plan Note (Signed)
With a history of schizophrenia, continue current home medication including Abilify, Remeron, and risperidone We will hold BuSpar and Cymbalta

## 2021-08-13 NOTE — Assessment & Plan Note (Signed)
-   Continue bowel regimen with MiraLAX and Senokot

## 2021-08-13 NOTE — Assessment & Plan Note (Signed)
Continue PPI ?

## 2021-08-13 NOTE — Assessment & Plan Note (Signed)
Not on any iron supplements, stable H&H -If possible will stay away from iron supplements due to chronic constipation

## 2021-08-13 NOTE — ED Notes (Signed)
Pt c/o of feeling shaky and requesting something to eat.  CBG checked and result is 56.  EDP made aware and new order for pt may eat.  Pt states she is on a liquid diet.  Apple juice and an ensure provided for pt and pt drank all of ensure and apple juice.

## 2021-08-13 NOTE — Plan of Care (Signed)

## 2021-08-13 NOTE — Assessment & Plan Note (Signed)
-  History of DVT - Continue Eliquis

## 2021-08-13 NOTE — Assessment & Plan Note (Signed)
-   Recent treatment for pneumonia, -Dust x-ray and CT reviewed, possible bilateral lower lobe infiltrate -Started on IV antibiotics of Rocephin and azithromycin -Obtaining cultures, anticipating quick de-escalation of antibiotics -Treated with IV azithromycin and Rocephin, switch to p.o. Levaquin for 4 more days

## 2021-08-13 NOTE — Assessment & Plan Note (Signed)
Continue Synthroid °

## 2021-08-13 NOTE — H&P (Signed)
History and Physical   Patient: [name]                            PCP: Carrolyn Meiers, MD                    DOB: 08-15-1945            DOA: 08/13/2021 YQI:[id number]             DOS: 08/13/2021, 3:08 PM  Carrolyn Meiers, MD  Patient coming from:   HOME  I have personally reviewed patient's medical records, in electronic medical records, including:  [facility], and care everywhere.    Chief Complaint:   Chief Complaint  Patient presents with   Abdominal Pain    History of present illness:    [name] is a 53 Yyo.F from [place], with history of IBS chronic constipation, schizophrenia, mood disorder, DM2, HTN, HLD, DVTs, GERD, CVA with hemiparesis, A-fib (on Eliquis)   Presented abdominal pain, with constipation, fecal impaction, and incidental finding of pneumonia. Patient reported that her last bowel movement was 7-8 days ago with normal BM.  Denies any rectal bleeding Patient has been taking MiraLAX daily with no results   ED course: Blood pressure 117/63, pulse 71, temperature 97.8 F (36.6 C), temperature source Axillary, resp. rate 17, height '5\' 6"'$  (1.676 m), weight 56.2 kg, SpO2 92 %.  LABs: CMP within normal limits, BNP 297, WBC 12.3, Pending respiratory panel,  CT abdomen pelvis: There is mild diffuse wall thickening in rectum. Possibility of stercoral colitis should be considered.   There is interval appearance of increased interstitial markings in both lower lung fields suggesting possible interstitial pneumonia. There is small right pleural effusion.   There is moderate sized hiatal hernia. There is wall thickening in lower thoracic esophagus suggesting possible reflux esophagitis.   Liquid stool is seen in right colon. Moderate to large amount of stool is seen in left colon. There is wall thickening in rectum along with mild perirectal stranding.    Chest x-ray: Cardiac enlargement with small  bilateral pleural effusions and pulmonary vascular congestion.  Partial disimpaction was attempted in ED prior to CT scan obtaining Concerning for further disimpaction and treatment for pneumonia  Asked for patient to be admitted.   Chart review inside information:  Admission on 07/21/2021-for intractable nausea vomiting debris in esophagus moderate dilatation concerning for esophageal stricture stool burden in colon EGD 05/17/2016 for esophageal stenosis, small hiatal hernia gastric polyp chronic gastritis nonbleeding duodenal diverticulum Admission 4/26-for pneumonia was discharged with Augmentin EGD 07/23/2021 -debris/fluid in esophagus, retained food was removed distal esophageal stricture was noted -patient developed A-fib with RVR with hypotension procedure was abandoned, esophageal dilatation was not performed.     Patient Denies having: Fever, Chills, Cough, SOB, Chest Pain, N/V/D, headache, dizziness, lightheadedness,  Dysuria, Joint pain, rash, open wounds   Review of Systems: As per HPI, otherwise 10 point review of systems were negative.   ----------------------------------------------------------------------------------------------------------------------  Allergies  Allergen Reactions   Sulfa Antibiotics Rash    Home MEDs:  Prior to Admission medications   Medication Sig Start Date End Date Taking? Authorizing Provider  ABILIFY 2 MG tablet Take 1 tablet (2 mg total) by mouth daily. Patient taking differently: Take 2 mg by mouth at bedtime. 11/09/20  Yes Hisada, Elie Goody, MD  acetaminophen (TYLENOL) 325 MG tablet Take 650 mg by mouth  3 (three) times daily as needed for mild pain or moderate pain.    Yes [provider]  albuterol (VENTOLIN HFA) 108 (90 Base) MCG/ACT inhaler Inhale 2 puffs into the lungs every 6 (six) hours as needed for wheezing or shortness of breath. 05/20/21  Yes Barton Dubois, MD  amiodarone (PACERONE) 200 MG tablet Take 1 tablet (200 mg total)  by mouth daily. 04/24/21  Yes Barton Dubois, MD  apixaban (ELIQUIS) 5 MG TABS tablet Take 1 tablet (5 mg total) by mouth 2 (two) times daily. 02/21/20  Yes Richarda Osmond, MD  atorvastatin (LIPITOR) 40 MG tablet Take 1 tablet by mouth at bedtime. 05/30/20  Yes [provider]  busPIRone (BUSPAR) 7.5 MG tablet Take 7.5 mg by mouth 2 (two) times daily. 07/07/21  Yes [provider]  calcium carbonate (TUMS - DOSED IN MG ELEMENTAL CALCIUM) 500 MG chewable tablet Chew 1 tablet by mouth 4 (four) times daily as needed for heartburn.   Yes [provider]  clonazePAM (KLONOPIN) 0.5 MG tablet Take 1 tablet (0.5 mg total) by mouth 3 (three) times daily as needed for anxiety. Patient taking differently: Take 0.5 mg by mouth daily. 05/20/21  Yes Barton Dubois, MD  diltiazem (CARDIZEM CD) 120 MG 24 hr capsule Take 120 mg by mouth daily. 05/30/20  Yes [provider]  DULoxetine (CYMBALTA) 60 MG capsule Take 120 mg by mouth daily.   Yes [provider]  gabapentin (NEURONTIN) 300 MG capsule Take 1 capsule (300 mg total) by mouth every 8 (eight) hours as needed (pain). 05/20/21  Yes Barton Dubois, MD  GOODSENSE ARTIFICIAL TEARS 0.5-0.6 % SOLN Apply to eye. 06/03/21  Yes [provider]  HYDROcodone-acetaminophen (NORCO) 10-325 MG tablet Take 1 tablet by mouth every 8 (eight) hours as needed for severe pain. Patient taking differently: Take 1 tablet by mouth in the morning, at noon, and at bedtime. 05/20/21  Yes Barton Dubois, MD  insulin detemir (LEVEMIR FLEXTOUCH) 100 UNIT/ML FlexPen INJECT 22 UNITS SUBCUTANEOUSLY AT BEDTIME.(HOLD IF BS<70: CALL MD IF BS> 400) Patient taking differently: 22 Units at bedtime. 07/15/21  Yes Reardon, Juanetta Beets, NP  latanoprost (XALATAN) 0.005 % ophthalmic solution Place 1 drop into both eyes at bedtime.   Yes [provider]  loratadine (CLARITIN) 10 MG tablet Take 10 mg by mouth daily.   Yes [provider]   lubiprostone (AMITIZA) 8 MCG capsule Take 1 capsule by mouth daily. 08/10/21  Yes [provider]  metoCLOPramide (REGLAN) 5 MG tablet Take 5 mg by mouth in the morning, at noon, and at bedtime. If zofran is not working   Yes [provider]  metoprolol succinate (TOPROL-XL) 50 MG 24 hr tablet Take 1 tablet (50 mg total) by mouth daily. Take with or immediately following a meal. 04/01/21  Yes Tat, Shanon Brow, MD  midodrine (PROAMATINE) 10 MG tablet Take 10 mg by mouth 3 (three) times daily.   Yes [provider]  mirtazapine (REMERON) 30 MG tablet Take 30 mg by mouth at bedtime.   Yes [provider]  naloxegol oxalate (MOVANTIK) 12.5 MG TABS tablet Take 1 tablet (12.5 mg total) by mouth daily. 04/24/21  Yes Barton Dubois, MD  NOVOLOG FLEXPEN 100 UNIT/ML FlexPen INJECT SUBCUTANEOUSLY AS FOLLOWS WITH MEALS: 90-150=10u: 151-200=11u: 201-250=12u: 251-300=13u: 301-350=14u: 351-400=15u: BS>400=16u & CALL MD. Patient taking differently: 10-16 Units 3 (three) times daily with meals. INJECT SUBCUTANEOUSLY AS FOLLOWS WITH MEALS: 90-150=10u: 151-200=11u: 201-250=12u: 251-300=13u: 301-350=14u: 351-400=15u: BS>400=16u & CALL MD.  02/28/21  Yes Reardon, Juanetta Beets, NP  ondansetron (ZOFRAN) 4 MG tablet Take 4 mg by mouth 2 (two) times daily. 06/24/21  Yes [provider]  oxybutynin (DITROPAN-XL) 5 MG 24 hr tablet Take 5 mg by mouth daily. 04/20/21  Yes [provider]  pantoprazole (PROTONIX) 40 MG tablet Take 1 tablet (40 mg total) by mouth 2 (two) times daily before a meal. 11/17/20  Yes Mahala Menghini, PA-C  polyethylene glycol (MIRALAX / GLYCOLAX) 17 g packet Take 17 g by mouth daily. 04/24/21  Yes Barton Dubois, MD  risperiDONE (RISPERDAL) 1 MG tablet Take 1-2 mg by mouth 2 (two) times daily. Take 1 tablet by mouth in the morning. Take 2 tablets by mouth at bedtime. 04/04/21  Yes [provider]  SANTYL 250 UNIT/GM ointment Apply 1 Application topically daily.  07/05/21  Yes [provider]  senna (SENOKOT) 8.6 MG tablet Take 1 tablet by mouth daily.   Yes [provider]  Skin Protectants, Misc. (BAZA PROTECT EX) Apply 1 Application topically in the morning and at bedtime. 08/02/21  Yes [provider]  TRADJENTA 5 MG TABS tablet TAKE (1) TABLET BY MOUTH ONCE DAILY. Patient taking differently: Take 5 mg by mouth daily. 04/04/21  Yes Reardon, Juanetta Beets, NP  EASYMAX TEST test strip USE TO CHECK BLOOD SUGAR FOUR TIMES DAILY.(HOLD IF BS<70: CALL MD IF BS> 400) 07/21/21   Reardon, Juanetta Beets, NP    PRN MEDs: acetaminophen **OR** acetaminophen, bisacodyl, clonazePAM, hydrALAZINE, HYDROmorphone (DILAUDID) injection, ipratropium, levalbuterol, ondansetron **OR** ondansetron (ZOFRAN) IV, oxyCODONE, sodium phosphate, traZODone  Past Medical History:  Diagnosis Date   Anxiety    Arthritis    Atrial fibrillation (HCC)    Atrial flutter ([place])    Collagen vascular disease ([place])    COVID-19 virus infection    COVID-19+ approx 01/24/19; asymptomatic course with full recovery   Dependence on wheelchair    pivot/transfers   Depression    History of psychosis and previous suicide attempt   DVT, lower extremity, recurrent ([place])    Long-term Coumadin per Dr. Legrand Rams   Essential hypertension    GERD (gastroesophageal reflux disease)    Hemiplegia ([place]) 2010   Left side   History of stroke    Acute infarct and right cerebral white matter small vessel disease 12/10   Leg DVT (deep venous thromboembolism), acute ([place]) 2006   Orthostatic hypotension    Schizophrenia ([place])    Stroke ([place])    left sided weakness   Type 2 diabetes mellitus ([place])     Past Surgical History:  Procedure Laterality Date   BACK SURGERY     BIOPSY N/A 11/24/2014   Procedure: BIOPSY;  Surgeon: Danie Binder, MD;  Location: [facility];  Service: Endoscopy;  Laterality: N/A;   BIOPSY  05/17/2016   Procedure: BIOPSY;  Surgeon: Danie Binder, MD;  Location[facility];  Service: Endoscopy;;  gastric biopsy   COLONOSCOPY WITH PROPOFOL N/A 11/24/2014   Dr. Rudie Meyer polyps removed/moderate sized internal hemorrhoids, tubular adenomas. Next surveillance in 3 years   ESOPHAGEAL DILATION N/A 07/23/2021   Procedure: ESOPHAGEAL DILATION;  Surgeon: Eloise Harman, DO;  Location[facility];  Service: Endoscopy;  Laterality: N/A;   ESOPHAGOGASTRODUODENOSCOPY (EGD) WITH PROPOFOL N/A 11/24/2014   Dr. Clayburn Pert HH/patent stricture at the gastroesophageal junction, mild non-erosive gastritis, path negative for H.pylori or celiac sprue   ESOPHAGOGASTRODUODENOSCOPY (EGD) WITH PROPOFOL N/A 05/17/2016   Procedure: ESOPHAGOGASTRODUODENOSCOPY (EGD) WITH PROPOFOL;  Surgeon: Danie Binder, MD;  [facility];  Service: Endoscopy;  Laterality: N/A;  12:45pm   ESOPHAGOGASTRODUODENOSCOPY (EGD) WITH PROPOFOL N/A 07/23/2021   Procedure: ESOPHAGOGASTRODUODENOSCOPY (EGD) WITH PROPOFOL;  Surgeon: Eloise Harman, DO;  [facility];  Service: Endoscopy;  Laterality: N/A;   GIVENS CAPSULE STUDY N/A 12/11/2014   MULTILPLE EROSION IN the stomach WITH ACTIVE OOZING. OCCASIONAL EROSIONS AND RARE ULCER SEEN IN PROXIMAL SMALL BOWEL . No masses or AVMs SEEN. NO OLD BLOOD OR FRESH BLOOD SEEN.    POLYPECTOMY N/A 11/24/2014   Procedure: POLYPECTOMY;  Surgeon: Danie Binder, MD;  Location: [facility];  Service: Endoscopy;  Laterality: N/A;   SAVORY DILATION N/A 05/17/2016   Procedure: SAVORY DILATION;  Surgeon: Danie Binder, MD;  [facility];  Service: Endoscopy;  Laterality: N/A;     reports that she quit smoking about 26 years ago. Her smoking use included cigarettes. She has a 5.00 pack-year smoking history. She has never used smokeless tobacco. She reports that she does not drink alcohol and does not use drugs.   Family History  Problem Relation Age of Onset   Hypertension Mother    Colon cancer Neg Hx     Physical Exam:   Vitals:   08/13/21 1407  08/13/21 1415 08/13/21 1430 08/13/21 1434  BP:   120/68   Pulse:  93  69  Resp:  '18 20 17  '$ Temp: 97.8 F (36.6 C)     TempSrc: Axillary     SpO2:  92% 94% 94%  Weight:      Height:       Constitutional: NAD, calm, comfortable Eyes: PERRL, lids and conjunctivae normal ENMT: Mucous membranes are moist. Posterior pharynx clear of any exudate or lesions.Normal dentition.  Neck: normal, supple, no masses, no thyromegaly Respiratory: clear to auscultation bilaterally, no wheezing, no crackles. Normal respiratory effort. No accessory muscle use.  Cardiovascular: Regular rate and rhythm, no murmurs / rubs / gallops. No extremity edema. 2+ pedal pulses. No carotid bruits.  Abdomen: no tenderness, no masses palpated. No hepatosplenomegaly. Bowel sounds positive.  Musculoskeletal: Mild left-sided weakness no clubbing / cyanosis. No joint deformity upper and lower extremities. Good ROM, no contractures. Normal muscle tone.  Neurologic: Mild left-sided weakness, speech intact CN II-XII grossly intact. Sensation intact, DTR normal. Strength 5/5 in all 4.  Psychiatric: Normal judgment and insight. Alert and oriented x 3. Normal mood.  Skin: no rashes, lesions, ulcers. No induration Decubitus/ulcers:  Wounds: per nursing documentation         Labs on admission:    I have personally reviewed following labs and imaging studies  CBC: Recent Labs  Lab 08/13/21 1059  WBC 12.3*  NEUTROABS 10.8*  HGB 10.7*  HCT 33.6*  MCV 88.7  PLT 841   Basic Metabolic Panel: Recent Labs  Lab 08/13/21 1059  NA 136  K 3.8  CL 100  CO2 29  GLUCOSE 80  BUN 12  CREATININE 0.79  CALCIUM 8.3*  MG 2.2  PHOS 3.6   GFR: Estimated Creatinine Clearance: 53.9 mL/min (by C-G formula based on SCr of 0.79 mg/dL). Liver Function Tests: Recent Labs  Lab 08/13/21 1059  AST 14*  ALT 10  ALKPHOS 73  BILITOT 0.5  PROT 6.6  ALBUMIN 3.2*   No results for input(s): "LIPASE", "AMYLASE" in the last 168  hours. No results for input(s): "AMMONIA" in the last 168 hours. Coagulation Profile: Recent Labs  Lab 08/13/21 1059  INR 1.6*   CBG: Recent Labs  Lab 08/13/21 1315 08/13/21 1456  GLUCAP 56* 89    Urine analysis:    Component Value Date/Time   COLORURINE YELLOW 08/04/2021 1024   APPEARANCEUR CLEAR 08/04/2021 1024   APPEARANCEUR Cloudy (A) 05/01/2016 1525   LABSPEC 1.009 08/04/2021 1024   PHURINE 7.0 08/04/2021 1024   GLUCOSEU NEGATIVE 08/04/2021 1024   HGBUR NEGATIVE 08/04/2021 1024   BILIRUBINUR NEGATIVE 08/04/2021 1024   BILIRUBINUR Negative 05/01/2016 1525   KETONESUR NEGATIVE 08/04/2021 1024   PROTEINUR NEGATIVE 08/04/2021 1024   UROBILINOGEN 0.2 09/14/2014 1703   NITRITE NEGATIVE 08/04/2021 1024   LEUKOCYTESUR NEGATIVE 08/04/2021 1024    Last A1C:  Lab Results  Component Value Date   HGBA1C 7.1 (H) 03/22/2021     Radiologic Exams on Admission:   CT ABDOMEN PELVIS W CONTRAST  Result Date: 08/13/2021 CLINICAL DATA:  Abdominal pain, constipation EXAM: CT ABDOMEN AND PELVIS WITH CONTRAST TECHNIQUE: Multidetector CT imaging of the abdomen and pelvis was performed using the standard protocol following bolus administration of intravenous contrast. RADIATION DOSE REDUCTION: This exam was performed according to the departmental dose-optimization program which includes automated exposure control, adjustment of the mA and/or kV according to patient size and/or use of iterative reconstruction technique. CONTRAST:  153m OMNIPAQUE IOHEXOL 300 MG/ML  SOLN COMPARISON:  07/21/2021 FINDINGS: Lower chest: There are new patchy interstitial and alveolar densities are seen in lower lung fields. There is calcified nodule in right lower lung field. There is minimal right pleural effusion. Hepatobiliary: There is 2.8 cm hemangioma in the right lobe of liver. There is no dilation of bile ducts. Gallbladder stone is seen. There is mild diffuse wall thickening in gallbladder. Pancreas: There  is pancreatic atrophy. No focal abnormalities are seen. Spleen: Unremarkable. Adrenals/Urinary Tract: Adrenals are unremarkable. There is no hydronephrosis. There are no renal or ureteral stones. Urinary bladder is unremarkable. Stomach/Bowel: Moderate-sized hiatal hernia is seen. There is wall thickening in the lower thoracic esophagus. Stomach is not distended. Small bowel loops are not dilated. Appendix is not dilated. Cecum is higher and more medial than usual in position. Liquid stool is seen in right colon. Moderate to large amount of stool is seen in left colon. There is wall thickening in rectum along with mild perirectal stranding. Vascular/Lymphatic: Scattered calcifications are seen in aorta and its major branches. Reproductive: Unremarkable. Other: There is no ascites or pneumoperitoneum. Musculoskeletal: There is anterolisthesis at the L4-L5 level along with marked spinal stenosis and encroachment of neural foramina. IMPRESSION: There is mild diffuse wall thickening in rectum. Possibility of stercoral colitis should be considered. Gallbladder stones. There is wall thickening in gallbladder. Possibility of acute cholecystitis is not excluded. If clinically warranted, gallbladder sonogram may be considered. There is no evidence of small-bowel obstruction or pneumoperitoneum. There is no hydronephrosis. There is interval appearance of increased interstitial markings in both lower lung fields suggesting possible interstitial pneumonia. There is small right pleural effusion. There is moderate sized hiatal hernia. There is wall thickening in lower thoracic esophagus suggesting possible reflux esophagitis. Lumbar spondylosis with marked spinal stenosis at the L4-L5 level. Possible hemangioma is seen in liver. Other findings as described in the body of the report. Electronically Signed   By: PElmer PickerM.D.   On: 08/13/2021 13:03   DG Chest 2 View  Result Date: 08/13/2021 CLINICAL DATA:  Dyspnea  for 1 month. EXAM: CHEST - 2 VIEW COMPARISON:  08/04/21 FINDINGS: Unchanged cardiac enlargement. Small bilateral pleural effusions are  identified with veil like opacification of the lower lung zones. Pulmonary vascular congestion without frank edema. No airspace opacities. Spondylosis noted within the thoracic spine. Mild compression deformities within the lower thoracic spine suspected but difficult to visualize due to patient positioning and overlying soft tissues. IMPRESSION: Cardiac enlargement with small bilateral pleural effusions and pulmonary vascular congestion. Electronically Signed   By: Kerby Moors M.D.   On: 08/13/2021 10:43    EKG:   Independently reviewed.  Orders placed or performed during the hospital encounter of 08/13/21   ED EKG   ED EKG   EKG 12-Lead   EKG 12-Lead   EKG 12-Lead   ---------------------------------------------------------------------------------------------------------------------------------------    Assessment / Plan:   Principal Problem:   PNA (pneumonia) Active Problems:   Constipation   Uncontrolled type 2 diabetes mellitus with hyperglycemia, with long-term current use of insulin (HCC)   Chronic superficial gastritis without bleeding   Schizophrenia (HCC)   Mood disorder (HCC)   IDA (iron deficiency anemia)   Left hemiparesis (HCC)   History of cerebrovascular accident (CVA) with residual deficit   Generalized weakness   GERD (gastroesophageal reflux disease)   Atrial fibrillation with RVR (HCC)   Hyperthyroidism   Fecal impaction (HCC)   DVT, lower extremity, recurrent (HCC)   Essential hypertension   Hypotensive episode   Assessment and Plan: * PNA (pneumonia) - Recent treatment for pneumonia, -Dust x-ray and CT reviewed, possible bilateral lower lobe infiltrate -Started on IV antibiotics of Rocephin and azithromycin -Obtaining cultures, anticipating quick de-escalation of antibiotics   Constipation - Continue bowel  regimen with MiraLAX and Senokot  Mood disorder ([place]) With a history of schizophrenia, continue current home medication including Abilify, Remeron, and risperidone We will hold BuSpar and Cymbalta  Schizophrenia ([place]) Stable continue current home medication with exception of BuSpar and Cymbalta, resuming Abilify Remeron and risperidone  Chronic superficial gastritis without bleeding - Monitoring closely, continue PPI  Uncontrolled type 2 diabetes mellitus with hyperglycemia, with long-term current use of insulin (HCC) - We will check her CBG q. ACH S, SSI coverage, along with her long-acting insulin of 22 units of Levemir, holding home medication of Tradjenta  Hyperthyroidism Continue Synthroid  Atrial fibrillation with RVR ([place]) Rate well controlled  -continue home medication of amiodarone and Cardizem -Continue Eliquis  GERD (gastroesophageal reflux disease) Continue PPI  Generalized weakness - PT /OT evaluation, fall precaution  History of cerebrovascular accident (CVA) with residual deficit Continue Eliquis, statins -Hemiplegia chronic-stable  Left hemiparesis (HCC) Stable  IDA (iron deficiency anemia) Not on any iron supplements, monitoring H&H  Fecal impaction (HCC) - Attempted fecal impaction by ED physician and enema -Continue mineral oil enema, MiraLAX and Senokot laxative orally -CT scan reviewed continue to have stool burden in colon  DVT, lower extremity, recurrent ([place]) -History of DVT - Continue Eliquis  Essential hypertension Hypotensive  -Med history reveals patient is on BP meds and midodrine -It seems like patient has other autonomic disorder -We will continue beta-blockers, amiodarone and Cardizem for rate control -Continue midodrine for hypotension  Hypotensive episode - No signs of sepsis chronic hypotension likely due to rate control medication of Cardizem, amiodarone, and metoprolol -Continue home medication of midodrine 10 mg p.o. 3  times daily -Gentle IV fluid hydration     Consults called:  None  -------------------------------------------------------------------------------------------------------------------------------------------- DVT prophylaxis:  TED hose Start: 08/13/21 1403 SCDs Start: 08/13/21 1403 apixaban (ELIQUIS) tablet 5 mg   Code Status:   Code Status: Full Code   Admission status:  Patient will be admitted as Inpatient, with a greater than 2 midnight length of stay. Level of care: Telemetry   Family Communication:  none at bedside  (The above findings and plan of care has been discussed with patient in detail, the patient expressed understanding and agreement of above plan)  --------------------------------------------------------------------------------------------------------------------------------------------------  Disposition Plan:  Anticipated 1-2 days Status is: Inpatient Remains inpatient appropriate because: Requiring IV fluids, IV antibiotics     ----------------------------------------------------------------------------------------------------------------------------------------------------  Time spent: > than  54  Min.   SIGNED: Deatra James, MD, FHM. [facility],  Pager (Please use [email or web address] to page to text)  If 7PM-7AM, please contact night-coverage [email or web address],  08/13/2021, 3:08 PM

## 2021-08-13 NOTE — Assessment & Plan Note (Addendum)
Hypotensive  -Med history reveals patient is on BP meds and midodrine -It seems like patient has other autonomic disorder -We will continue beta-blockers, amiodarone and Cardizem for rate control -Continue midodrine for hypotension

## 2021-08-14 DIAGNOSIS — J189 Pneumonia, unspecified organism: Secondary | ICD-10-CM | POA: Diagnosis present

## 2021-08-14 DIAGNOSIS — K5900 Constipation, unspecified: Secondary | ICD-10-CM

## 2021-08-14 DIAGNOSIS — I4891 Unspecified atrial fibrillation: Secondary | ICD-10-CM | POA: Diagnosis not present

## 2021-08-14 DIAGNOSIS — K2930 Chronic superficial gastritis without bleeding: Secondary | ICD-10-CM | POA: Diagnosis not present

## 2021-08-14 LAB — CBC
HCT: 30.1 % — ABNORMAL LOW (ref 36.0–46.0)
Hemoglobin: 9.5 g/dL — ABNORMAL LOW (ref 12.0–15.0)
MCH: 28.1 pg (ref 26.0–34.0)
MCHC: 31.6 g/dL (ref 30.0–36.0)
MCV: 89.1 fL (ref 80.0–100.0)
Platelets: 261 10*3/uL (ref 150–400)
RBC: 3.38 MIL/uL — ABNORMAL LOW (ref 3.87–5.11)
RDW: 15.3 % (ref 11.5–15.5)
WBC: 8.2 10*3/uL (ref 4.0–10.5)
nRBC: 0 % (ref 0.0–0.2)

## 2021-08-14 LAB — BASIC METABOLIC PANEL
Anion gap: 4 — ABNORMAL LOW (ref 5–15)
BUN: 10 mg/dL (ref 8–23)
CO2: 28 mmol/L (ref 22–32)
Calcium: 7.9 mg/dL — ABNORMAL LOW (ref 8.9–10.3)
Chloride: 107 mmol/L (ref 98–111)
Creatinine, Ser: 0.56 mg/dL (ref 0.44–1.00)
GFR, Estimated: >60 mL/min (ref >60)
Glucose, Bld: 138 mg/dL — ABNORMAL HIGH (ref 70–99)
Potassium: 3.7 mmol/L (ref 3.5–5.1)
Sodium: 139 mmol/L (ref 135–145)

## 2021-08-14 LAB — GLUCOSE, CAPILLARY
Glucose-Capillary: 151 mg/dL — ABNORMAL HIGH (ref 70–99)
Glucose-Capillary: 191 mg/dL — ABNORMAL HIGH (ref 70–99)
Glucose-Capillary: 164 mg/dL — ABNORMAL HIGH (ref 70–99)

## 2021-08-14 LAB — APTT: aPTT: 30 seconds (ref 24–36)

## 2021-08-14 MED ORDER — LEVOFLOXACIN 250 MG PO TABS: 250.0000 mg | ORAL_TABLET | Freq: Every day | ORAL | 0 refills | Status: AC

## 2021-08-14 NOTE — Progress Notes (Signed)
TOC CSW spoke with [name] at [facility] [phone].  The following information was all shared with RN.  Room #:  21  Call Report #:  [phone], Ask for [name] or [name].  Kanishk Stroebel Tarpley-Carter, MSW, LCSW-A Pronouns:  She/Her/Hers [facility] HealthTransitions of Care Clinical Social Worker Direct Number:  [phone] [email or web address]

## 2021-08-14 NOTE — Plan of Care (Signed)
  Problem: Education: Goal: Knowledge of General Education information will improve Description: Including pain rating scale, medication(s)/side effects and non-pharmacologic comfort measures Outcome: Progressing   Problem: Clinical Measurements: Goal: Cardiovascular complication will be avoided Outcome: Progressing   Problem: Elimination: Goal: Will not experience complications related to bowel motility Outcome: Progressing Goal: Will not experience complications related to urinary retention Outcome: Progressing   Problem: Pain Managment: Goal: General experience of comfort will improve Outcome: Progressing   Problem: Safety: Goal: Ability to remain free from injury will improve Outcome: Progressing   Problem: Skin Integrity: Goal: Risk for impaired skin integrity will decrease Outcome: Progressing

## 2021-08-14 NOTE — Care Management (Signed)
Called guardian for Code 60. No answer left confidential voice message to call back ASAP to [facility]

## 2021-08-14 NOTE — Care Management (Signed)
Made multiple attempts to call guardian and dss worker listed, unable to reach, they did not call back after message. Patient can be DC to [facility]

## 2021-08-14 NOTE — Discharge Summary (Signed)
Physician Discharge Summary   Patient: [name] MRN: [id number] DOB: [birth year]  Admit date:     08/13/2021  Discharge date: 08/14/21  Discharge Physician: Deatra James   PCP: Carrolyn Meiers, MD   Recommendations at discharge:  Follow-up with a PCP in 1 week  Discharge Diagnoses: Principal Problem:   PNA (pneumonia) Active Problems:   Constipation   Uncontrolled type 2 diabetes mellitus with hyperglycemia, with long-term current use of insulin (HCC)   Chronic superficial gastritis without bleeding   Schizophrenia (HCC)   Mood disorder (HCC)   IDA (iron deficiency anemia)   Left hemiparesis (HCC)   History of cerebrovascular accident (CVA) with residual deficit   Generalized weakness   GERD (gastroesophageal reflux disease)   Atrial fibrillation with RVR (HCC)   Hyperthyroidism   Fecal impaction (HCC)   DVT, lower extremity, recurrent ([place])   Essential hypertension   Hypotensive episode  Resolved Problems:   * No resolved hospital problems. *  Hospital Course: [name] is a 74 Yyo.F [place], with history of IBS chronic constipation, schizophrenia, mood disorder, DM2, HTN, HLD, DVTs, GERD, CVA with hemiparesis, A-fib (on Eliquis)   Presented abdominal pain, with constipation, fecal impaction, and incidental finding of pneumonia. Patient reported that her last bowel movement was 7-8 days ago with normal BM.  Denies any rectal bleeding Patient has been taking MiraLAX daily with no results   ED course: Blood pressure 117/63, pulse 71, temperature 97.8 F (36.6 C), temperature source Axillary, resp. rate 17, height '5\' 6"'$  (1.676 m), weight 56.2 kg, SpO2 92 %.  LABs: CMP within normal limits, BNP 297, WBC 12.3, Pending respiratory panel,  CT abdomen pelvis: There is mild diffuse wall thickening in rectum. Possibility of stercoral colitis should be considered.   There is interval appearance of increased interstitial  markings in both lower lung fields suggesting possible interstitial pneumonia. There is small right pleural effusion.   There is moderate sized hiatal hernia. There is wall thickening in lower thoracic esophagus suggesting possible reflux esophagitis.   Liquid stool is seen in right colon. Moderate to large amount of stool is seen in left colon. There is wall thickening in rectum along with mild perirectal stranding.    Chest x-ray: Cardiac enlargement with small bilateral pleural effusions and pulmonary vascular congestion.  Partial disimpaction was attempted in ED prior to CT scan obtaining Concerning for further disimpaction and treatment for pneumonia  Asked for patient to be admitted.   Chart review inside information:  Admission on 07/21/2021-for intractable nausea vomiting debris in esophagus moderate dilatation concerning for esophageal stricture stool burden in colon EGD 05/17/2016 for esophageal stenosis, small hiatal hernia gastric polyp chronic gastritis nonbleeding duodenal diverticulum Admission 4/26-for pneumonia was discharged with Augmentin EGD 07/23/2021 -debris/fluid in esophagus, retained food was removed distal esophageal stricture was noted -patient developed A-fib with RVR with hypotension procedure was abandoned, esophageal dilatation was not performed.    Assessment and Plan: * PNA (pneumonia) - Recent treatment for pneumonia, -Dust x-ray and CT reviewed, possible bilateral lower lobe infiltrate -Started on IV antibiotics of Rocephin and azithromycin -Obtaining cultures, anticipating quick de-escalation of antibiotics -Treated with IV azithromycin and Rocephin, switch to p.o. Levaquin for 4 more days   Constipation - Continue bowel regimen with MiraLAX and Senokot  Mood disorder ([place]) With a history of schizophrenia, continue current home medication including Abilify, Remeron, and risperidone   Schizophrenia ([place]) Stable continue current home  medication with exception  of BuSpar and Cymbalta, resuming Abilify Remeron and risperidone  Chronic superficial gastritis without bleeding - continue PPI  Uncontrolled type 2 diabetes mellitus with hyperglycemia, with long-term current use of insulin (HCC) - Continue home regimen, as p.o. intake is back to normal  Hyperthyroidism Continue Synthroid  Atrial fibrillation with RVR (HCC) Rate well controlled  -continue home medication of amiodarone and Cardizem -Continue Eliquis  GERD (gastroesophageal reflux disease) Continue PPI  Generalized weakness - PT /OT , fall precaution  History of cerebrovascular accident (CVA) with residual deficit Continue Eliquis, statins -Hemiplegia chronic-stable  Left hemiparesis (HCC) Stable  IDA (iron deficiency anemia) Not on any iron supplements, stable H&H -If possible will stay away from iron supplements due to chronic constipation  Fecal impaction ([place]) - Attempted fecal impaction by ED physician and enema -Continue mineral oil enema, MiraLAX and Senokot laxative orally -CT scan reviewed continue to have stool burden in colon -Patient has had successful for bowel movement in past 12 hours  DVT, lower extremity, recurrent ([place]) -History of DVT - Continue Eliquis  Essential hypertension Hypotensive  -Med history reveals patient is on BP meds and midodrine -It seems like patient has other autonomic disorder -We will continue beta-blockers, amiodarone and Cardizem for rate control -Continue midodrine for hypotension  Hypotensive episode - No signs of sepsis chronic hypotension likely due to rate control medication of Cardizem, amiodarone, and metoprolol -Continue home medication of midodrine 10 mg p.o. 3 times daily -Status post gentle IV fluid hydration         Consultants: none  Procedures performed: none   Disposition: Nursing home Diet recommendation:  Discharge Diet Orders (From admission, onward)     Start      Ordered   08/14/21 0000  Diet - low sodium heart healthy        08/14/21 0905           Regular diet DISCHARGE MEDICATION: Allergies as of 08/14/2021       Reactions   Sulfa Antibiotics Rash        Medication List     STOP taking these medications    busPIRone 7.5 MG tablet Commonly known as: BUSPAR   DULoxetine 60 MG capsule Commonly known as: CYMBALTA   gabapentin 300 MG capsule Commonly known as: NEURONTIN       TAKE these medications    Abilify 2 MG tablet Generic drug: ARIPiprazole Take 1 tablet (2 mg total) by mouth daily. What changed: when to take this   acetaminophen 325 MG tablet Commonly known as: TYLENOL Take 650 mg by mouth 3 (three) times daily as needed for mild pain or moderate pain.   albuterol 108 (90 Base) MCG/ACT inhaler Commonly known as: VENTOLIN HFA Inhale 2 puffs into the lungs every 6 (six) hours as needed for wheezing or shortness of breath.   amiodarone 200 MG tablet Commonly known as: PACERONE Take 1 tablet (200 mg total) by mouth daily.   apixaban 5 MG Tabs tablet Commonly known as: ELIQUIS Take 1 tablet (5 mg total) by mouth 2 (two) times daily.   atorvastatin 40 MG tablet Commonly known as: LIPITOR Take 1 tablet by mouth at bedtime.   BAZA PROTECT EX Apply 1 Application topically in the morning and at bedtime.   calcium carbonate 500 MG chewable tablet Commonly known as: TUMS - dosed in mg elemental calcium Chew 1 tablet by mouth 4 (four) times daily as needed for heartburn.   clonazePAM 0.5 MG tablet Commonly known as: Bobbye [place]  Take 1 tablet (0.5 mg total) by mouth 3 (three) times daily as needed for anxiety. What changed: when to take this   diltiazem 120 MG 24 hr capsule Commonly known as: CARDIZEM CD Take 120 mg by mouth daily.   EasyMax Test test strip Generic drug: glucose blood USE TO CHECK BLOOD SUGAR FOUR TIMES DAILY.(HOLD IF BS<70: CALL MD IF BS> 400)   GoodSense Artificial Tears 5-6 MG/ML  Soln Generic drug: Polyvinyl Alcohol-Povidone Apply to eye.   HYDROcodone-acetaminophen 10-325 MG tablet Commonly known as: NORCO Take 1 tablet by mouth every 8 (eight) hours as needed for severe pain. What changed: when to take this   latanoprost 0.005 % ophthalmic solution Commonly known as: XALATAN Place 1 drop into both eyes at bedtime.   Levemir FlexTouch 100 UNIT/ML FlexPen Generic drug: insulin detemir INJECT 22 UNITS SUBCUTANEOUSLY AT BEDTIME.(HOLD IF BS<70: CALL MD IF BS> 400) What changed: See the new instructions.   levofloxacin 250 MG tablet Commonly known as: Levaquin Take 1 tablet (250 mg total) by mouth daily for 4 days.   loratadine 10 MG tablet Commonly known as: CLARITIN Take 10 mg by mouth daily.   lubiprostone 8 MCG capsule Commonly known as: AMITIZA Take 1 capsule by mouth daily.   metoCLOPramide 5 MG tablet Commonly known as: REGLAN Take 5 mg by mouth in the morning, at noon, and at bedtime. If zofran is not working   metoprolol succinate 50 MG 24 hr tablet Commonly known as: TOPROL-XL Take 1 tablet (50 mg total) by mouth daily. Take with or immediately following a meal.   midodrine 10 MG tablet Commonly known as: PROAMATINE Take 10 mg by mouth 3 (three) times daily.   mirtazapine 30 MG tablet Commonly known as: REMERON Take 30 mg by mouth at bedtime.   naloxegol oxalate 12.5 MG Tabs tablet Commonly known as: MOVANTIK Take 1 tablet (12.5 mg total) by mouth daily.   NovoLOG FlexPen 100 UNIT/ML FlexPen Generic drug: insulin aspart INJECT SUBCUTANEOUSLY AS FOLLOWS WITH MEALS: 90-150=10u: 151-200=11u: 201-250=12u: 251-300=13u: 301-350=14u: 351-400=15u: BS>400=16u & CALL MD. What changed: See the new instructions.   ondansetron 4 MG tablet Commonly known as: ZOFRAN Take 4 mg by mouth 2 (two) times daily.   oxybutynin 5 MG 24 hr tablet Commonly known as: DITROPAN-XL Take 5 mg by mouth daily.   pantoprazole 40 MG tablet Commonly known as:  PROTONIX Take 1 tablet (40 mg total) by mouth 2 (two) times daily before a meal.   polyethylene glycol 17 g packet Commonly known as: MIRALAX / GLYCOLAX Take 17 g by mouth daily.   risperiDONE 1 MG tablet Commonly known as: RISPERDAL Take 1-2 mg by mouth 2 (two) times daily. Take 1 tablet by mouth in the morning. Take 2 tablets by mouth at bedtime.   Santyl 250 UNIT/GM ointment Generic drug: collagenase Apply 1 Application topically daily.   senna 8.6 MG tablet Commonly known as: SENOKOT Take 1 tablet by mouth daily.   Tradjenta 5 MG Tabs tablet Generic drug: linagliptin TAKE (1) TABLET BY MOUTH ONCE DAILY. What changed: See the new instructions.        Discharge Exam: Filed Weights   08/13/21 7619 08/13/21 1549 08/14/21 0500  Weight: 56.2 kg 63.3 kg 63.1 kg      Physical Exam:   General:  AAO x 3,  cooperative, no distress;   HEENT:  Normocephalic, PERRL, otherwise with in Normal limits   Neuro:  CNII-XII intact. , normal motor and sensation, reflexes intact  Lungs:   Clear to auscultation BL, Respirations unlabored,  No wheezes / crackles  Cardio:    S1/S2, RRR, No murmure, No Rubs or Gallops   Abdomen:  Soft, non-tender, bowel sounds active all four quadrants, no guarding or peritoneal signs.  Muscular  skeletal:  Limited exam -global generalized weaknesses - in bed, able to move all 4 extremities,   2+ pulses,  symmetric, No pitting edema  Skin:  Dry, warm to touch, negative for any Rashes,  Wounds: Please see nursing documentation          Condition at discharge: fair  The results of significant diagnostics from this hospitalization (including imaging, microbiology, ancillary and laboratory) are listed below for reference.   Imaging Studies: CT ABDOMEN PELVIS W CONTRAST  Result Date: 08/13/2021 CLINICAL DATA:  Abdominal pain, constipation EXAM: CT ABDOMEN AND PELVIS WITH CONTRAST TECHNIQUE: Multidetector CT imaging of the abdomen and pelvis was  performed using the standard protocol following bolus administration of intravenous contrast. RADIATION DOSE REDUCTION: This exam was performed according to the departmental dose-optimization program which includes automated exposure control, adjustment of the mA and/or kV according to patient size and/or use of iterative reconstruction technique. CONTRAST:  193m OMNIPAQUE IOHEXOL 300 MG/ML  SOLN COMPARISON:  07/21/2021 FINDINGS: Lower chest: There are new patchy interstitial and alveolar densities are seen in lower lung fields. There is calcified nodule in right lower lung field. There is minimal right pleural effusion. Hepatobiliary: There is 2.8 cm hemangioma in the right lobe of liver. There is no dilation of bile ducts. Gallbladder stone is seen. There is mild diffuse wall thickening in gallbladder. Pancreas: There is pancreatic atrophy. No focal abnormalities are seen. Spleen: Unremarkable. Adrenals/Urinary Tract: Adrenals are unremarkable. There is no hydronephrosis. There are no renal or ureteral stones. Urinary bladder is unremarkable. Stomach/Bowel: Moderate-sized hiatal hernia is seen. There is wall thickening in the lower thoracic esophagus. Stomach is not distended. Small bowel loops are not dilated. Appendix is not dilated. Cecum is higher and more medial than usual in position. Liquid stool is seen in right colon. Moderate to large amount of stool is seen in left colon. There is wall thickening in rectum along with mild perirectal stranding. Vascular/Lymphatic: Scattered calcifications are seen in aorta and its major branches. Reproductive: Unremarkable. Other: There is no ascites or pneumoperitoneum. Musculoskeletal: There is anterolisthesis at the L4-L5 level along with marked spinal stenosis and encroachment of neural foramina. IMPRESSION: There is mild diffuse wall thickening in rectum. Possibility of stercoral colitis should be considered. Gallbladder stones. There is wall thickening in  gallbladder. Possibility of acute cholecystitis is not excluded. If clinically warranted, gallbladder sonogram may be considered. There is no evidence of small-bowel obstruction or pneumoperitoneum. There is no hydronephrosis. There is interval appearance of increased interstitial markings in both lower lung fields suggesting possible interstitial pneumonia. There is small right pleural effusion. There is moderate sized hiatal hernia. There is wall thickening in lower thoracic esophagus suggesting possible reflux esophagitis. Lumbar spondylosis with marked spinal stenosis at the L4-L5 level. Possible hemangioma is seen in liver. Other findings as described in the body of the report. Electronically Signed   By: PElmer PickerM.D.   On: 08/13/2021 13:03   DG Chest 2 View  Result Date: 08/13/2021 CLINICAL DATA:  Dyspnea for 1 month. EXAM: CHEST - 2 VIEW COMPARISON:  08/04/21 FINDINGS: Unchanged cardiac enlargement. Small bilateral pleural effusions are identified with veil like opacification of the lower lung zones. Pulmonary vascular congestion without frank  edema. No airspace opacities. Spondylosis noted within the thoracic spine. Mild compression deformities within the lower thoracic spine suspected but difficult to visualize due to patient positioning and overlying soft tissues. IMPRESSION: Cardiac enlargement with small bilateral pleural effusions and pulmonary vascular congestion. Electronically Signed   By: Kerby Moors M.D.   On: 08/13/2021 10:43   DG Chest Port 1 View  Result Date: 08/04/2021 CLINICAL DATA:  Weakness. EXAM: PORTABLE CHEST 1 VIEW COMPARISON:  Chest x-ray dated July 21, 2021. FINDINGS: The patient is rotated to the left. Unchanged mild cardiomegaly. Normal pulmonary vascularity. No focal consolidation, pleural effusion, or pneumothorax. No acute osseous abnormality. IMPRESSION: No active disease. Electronically Signed   By: Titus Dubin M.D.   On: 08/04/2021 11:22   CT Head  Wo Contrast  Result Date: 08/04/2021 CLINICAL DATA:  Altered mental status this morning EXAM: CT HEAD WITHOUT CONTRAST TECHNIQUE: Contiguous axial images were obtained from the base of the skull through the vertex without intravenous contrast. RADIATION DOSE REDUCTION: This exam was performed according to the departmental dose-optimization program which includes automated exposure control, adjustment of the mA and/or kV according to patient size and/or use of iterative reconstruction technique. COMPARISON:  05/29/2021 FINDINGS: Brain: Periventricular white matter and corona radiata hypodensities favor chronic ischemic microvascular white matter disease. Otherwise, the brainstem, cerebellum, cerebral peduncles, thalamus, basal ganglia, basilar cisterns, and ventricular system appear within normal limits. No intracranial hemorrhage, mass lesion, or acute CVA. Vascular: There is atherosclerotic calcification of the cavernous carotid arteries bilaterally. Skull: Unremarkable Sinuses/Orbits: Mild chronic left maxillary sinusitis. Prior ethmoidectomies. Prior maxillary antrostomies and unroofing of the left sphenoid sinus. Small right mastoid effusion. Other: No supplemental non-categorized findings. IMPRESSION: 1. No acute intracranial findings. Periventricular white matter and corona radiata hypodensities favor chronic ischemic microvascular white matter disease. 2. Prior sinus surgeries. Mild chronic left maxillary sinusitis and small right mastoid effusion. 3. Atherosclerosis. Electronically Signed   By: Van Clines M.D.   On: 08/04/2021 11:14   DG ESOPHAGUS W SINGLE CM (SOL OR THIN BA)  Result Date: 07/25/2021 EXAM: ESOPHAGUS/BARIUM SWALLOW/TABLET STUDY TECHNIQUE: Initial scout AP supine abdominal image obtained to insure adequate colon cleansing. Barium was introduced into the colon in a retrograde fashion and refluxed from the rectum to the cecum. Spot images of the colon followed by overhead  radiographs were obtained. FLUOROSCOPY: Radiation Exposure Index (as provided by the fluoroscopic device): 9.5 mGy Kerma COMPARISON:  CT abdomen 07/21/2021 FINDINGS: The patient is infirm and not able to assume standing position or multiple projections typically performed in a esophagram. The entire exam was performed with patient in the LPO position. The patient was not able to draw fluid from a straw. Because of this, we used a blunt tip syringe to inject boluses of about 5 cc of barium into the patient's mouth for individual swallows. She was able to swallow on command in this setting. Disruption of primary peristaltic waves in the mid esophagus noted on all swallows. Non propulsive tertiary contractions noted in the mid and distal esophagus. With patient in the supine position, even after several swallows, contrast did not make its way down into the stomach. Accordingly, tilted the patient up about 10 degrees to recruit gravity. Subsequently, contrast with slowly percolated into the stomach. In this setting I was not able to distend the distal esophagus more than about 0.8 cm. A 13 mm barium tablet extended to the distal esophagus but failed to pass even with dry swallows and with the patient being tilted  up 10 degrees. IMPRESSION: 1. Nonspecific esophageal dysmotility disorder, with termination of primary peristaltic waves in the mid esophagus, and tertiary contractions in the esophagus. A 13 mm barium tablet made its way to the lower thoracic esophagus but did not further proceed due to discoordinated tertiary contractions. 2. I was not able to distend the distal esophagus beyond about 7 mm. This may well simply be due to dysmotility but I cannot exclude a stricture. 3. [name] was not able to draw fluid from a straw. We used a blunt tip syringe to inject boluses of about 5 cc of barium for individual swallows. Today's examination was performed in the LPO position. Electronically Signed   By: Van Clines M.D.   On: 07/25/2021 15:46   CT ABDOMEN PELVIS W CONTRAST  Result Date: 07/21/2021 CLINICAL DATA:  Epigastric pain. EXAM: CT ABDOMEN AND PELVIS WITH CONTRAST TECHNIQUE: Multidetector CT imaging of the abdomen and pelvis was performed using the standard protocol following bolus administration of intravenous contrast. RADIATION DOSE REDUCTION: This exam was performed according to the departmental dose-optimization program which includes automated exposure control, adjustment of the mA and/or kV according to patient size and/or use of iterative reconstruction technique. CONTRAST:  125m OMNIPAQUE IOHEXOL 300 MG/ML  SOLN COMPARISON:  CT abdomen and pelvis 04/23/2021 FINDINGS: Lower chest: There is a calcified granuloma in the right lower lobe, unchanged. No acute findings. Hepatobiliary: Hepatic hemangioma measuring up to 3.3 cm appears unchanged. No new liver lesions. Gallstones are again noted. There is no bile duct dilatation. Pancreas: Unremarkable. No pancreatic ductal dilatation or surrounding inflammatory changes. Spleen: Normal in size without focal abnormality. Adrenals/Urinary Tract: Adrenal glands are unremarkable. Kidneys are normal, without renal calculi, focal lesion, or hydronephrosis. Bladder is unremarkable. Stomach/Bowel: Appendix appears normal. No evidence of bowel wall thickening, distention, or inflammatory changes. There is a large amount of stool in the rectosigmoid colon. There is a small hiatal hernia. The remaining stomach is within normal limits. There is distention and debris in the distal esophagus. Vascular/Lymphatic: Aortic atherosclerosis. No enlarged abdominal or pelvic lymph nodes. Reproductive: Uterus and bilateral adnexa are unremarkable. Other: No abdominal wall hernia or abnormality. No abdominopelvic ascites. Musculoskeletal: Degenerative changes affect the spine. There is stable grade 1 anterolisthesis at L4-L5. IMPRESSION: 1. There is debris in the distal  esophagus with moderate distention. This can be seen with gastroesophageal reflux. Distal esophageal obstructing process not excluded. Please correlate clinically. 2. Small hiatal hernia. 3. Large amount of stool in the rectosigmoid colon. 4. Cholelithiasis. 5.  Aortic Atherosclerosis (ICD10-I70.0). Electronically Signed   By: ARonney AstersM.D.   On: 07/21/2021 22:39   DG Chest 2 View  Result Date: 07/21/2021 CLINICAL DATA:  Chest pain EXAM: CHEST - 2 VIEW COMPARISON:  07/18/2021, 07/07/2021 FINDINGS: Cardiomegaly. Increased opacity at the left lung base. Aortic atherosclerosis. No pneumothorax IMPRESSION: 1. Cardiomegaly with interim mild airspace disease at the peripheral left base, atelectasis versus pneumonia Electronically Signed   By: KDonavan FoilM.D.   On: 07/21/2021 21:18   DG Chest Port 1 View  Result Date: 07/18/2021 CLINICAL DATA:  Abnormal chest radiograph question pneumothorax EXAM: PORTABLE CHEST 1 VIEW COMPARISON:  Portable exam 1545 hours compared to 1451 hours FINDINGS: Enlargement of cardiac silhouette. Mediastinal contours and pulmonary vascularity normal. Atherosclerotic calcification aorta. Chronic bronchitic and interstitial changes stable. No acute infiltrate, pleural effusion, or pneumothorax. Bones demineralized. IMPRESSION: Enlargement of cardiac silhouette. Chronic bronchitic and interstitial changes without acute infiltrate or pneumothorax. Aortic Atherosclerosis (ICD10-I70.0). Electronically  Signed   By: Lavonia Dana M.D.   On: 07/18/2021 15:55   DG Abdomen Acute W/Chest  Result Date: 07/18/2021 CLINICAL DATA:  Vomiting EXAM: DG ABDOMEN ACUTE WITH 1 VIEW CHEST COMPARISON:  04/23/2021, 07/07/2021 FINDINGS: There is no evidence of dilated bowel loops or free intraperitoneal air. Moderate volume of stool throughout the colon. No radiopaque calculi or other significant radiographic abnormality is seen. Stable mild cardiomegaly. Aortic atherosclerosis. Presumed skin fold  overlies the periphery of the left hemithorax. Chronic scarring at the left costophrenic angle. Chronically coarsened interstitial markings bilaterally. IMPRESSION: 1. Presumed skin fold overlying the left hemithorax. Repeat frontal radiograph of the chest following patient repositioning is recommended to exclude the possibility of a small left pneumothorax. 2. Chronic lung changes with scarring at the left costophrenic angle. 3. Nonobstructive bowel gas pattern. Moderate volume of stool throughout the colon. Electronically Signed   By: Davina Poke D.O.   On: 07/18/2021 15:06    Microbiology: Results for orders placed or performed during the hospital encounter of 08/13/21  Resp Panel by RT-PCR (Flu A&B, Covid) Anterior Nasal Swab     Status: None   Collection Time: 08/13/21  1:16 PM   Specimen: Anterior Nasal Swab  Result Value Ref Range Status   SARS Coronavirus 2 by RT PCR NEGATIVE NEGATIVE Final    Comment: (NOTE) SARS-CoV-2 target nucleic acids are NOT DETECTED.  The SARS-CoV-2 RNA is generally detectable in upper respiratory specimens during the acute phase of infection. The lowest concentration of SARS-CoV-2 viral copies this assay can detect is 138 copies/mL. A negative result does not preclude SARS-Cov-2 infection and should not be used as the sole basis for treatment or other patient management decisions. A negative result may occur with  improper specimen collection/handling, submission of specimen other than nasopharyngeal swab, presence of viral mutation(s) within the areas targeted by this assay, and inadequate number of viral copies(<138 copies/mL). A negative result must be combined with clinical observations, patient history, and epidemiological information. The expected result is Negative.  Fact Sheet for Patients:  EntrepreneurPulse.com.au  Fact Sheet for Healthcare Providers:  IncredibleEmployment.be  This test is no t yet  approved or cleared by the [place] FDA and  has been authorized for detection and/or diagnosis of SARS-CoV-2 by FDA under an Emergency Use Authorization (EUA). This EUA will remain  in effect (meaning this test can be used) for the duration of the COVID-19 declaration under Section 564(b)(1) of the Act, 21 U.S.C.section 360bbb-3(b)(1), unless the authorization is terminated  or revoked sooner.       Influenza A by PCR NEGATIVE NEGATIVE Final   Influenza B by PCR NEGATIVE NEGATIVE Final    Comment: (NOTE) The Xpert Xpress SARS-CoV-2/FLU/RSV plus assay is intended as an aid in the diagnosis of influenza from Nasopharyngeal swab specimens and should not be used as a sole basis for treatment. Nasal washings and aspirates are unacceptable for Xpert Xpress SARS-CoV-2/FLU/RSV testing.  Fact Sheet for Patients: EntrepreneurPulse.com.au  Fact Sheet for Healthcare Providers: IncredibleEmployment.be  This test is not yet approved or cleared by the [place] FDA and has been authorized for detection and/or diagnosis of SARS-CoV-2 by FDA under an Emergency Use Authorization (EUA). This EUA will remain in effect (meaning this test can be used) for the duration of the COVID-19 declaration under Section 564(b)(1) of the Act, 21 U.S.C. section 360bbb-3(b)(1), unless the authorization is terminated or revoked.  Performed at [facility], [address], [place], [place] [address]  Culture, blood (routine x 2)     Status: None (Preliminary result)   Collection Time: 08/13/21  1:42 PM   Specimen: BLOOD  Result Value Ref Range Status   Specimen Description   Final    BLOOD RIGHT ANTECUBITAL Performed at [facility], [place] [address], [place], [place] [address]    Special Requests   Final    BOTTLES DRAWN AEROBIC AND ANAEROBIC Blood Culture results may not be optimal due to an excessive volume of blood received in culture bottles Performed at  [facility] [address], [place], [place] [address]    Culture   Final    NO GROWTH < 12 HOURS Performed at [facility], [address] [place], [place] [address]    Report Status PENDING  Incomplete  Culture, blood (routine x 2)     Status: None (Preliminary result)   Collection Time: 08/13/21  1:51 PM   Specimen: BLOOD RIGHT HAND  Result Value Ref Range Status   Specimen Description   Final    BLOOD RIGHT HAND Performed at [facility], [place] [address], [place], [place] [address]    Special Requests   Final    BOTTLES DRAWN AEROBIC AND ANAEROBIC Blood Culture results may not be optimal due to an excessive volume of blood received in culture bottles Performed at [facility] [address], [place], [place] [address]    Culture   Final    NO GROWTH < 12 HOURS Performed at [facility], [address], [place], [place] [address]    Report Status PENDING  Incomplete    Labs: CBC: Recent Labs  Lab 08/13/21 1059 08/14/21 0521  WBC 12.3* 8.2  NEUTROABS 10.8*  --   HGB 10.7* 9.5*  HCT 33.6* 30.1*  MCV 88.7 89.1  PLT 295 654   Basic Metabolic Panel: Recent Labs  Lab 08/13/21 1059 08/14/21 0521  NA 136 139  K 3.8 3.7  CL 100 107  CO2 29 28  GLUCOSE 80 138*  BUN 12 10  CREATININE 0.79 0.56  CALCIUM 8.3* 7.9*  MG 2.2  --   PHOS 3.6  --    Liver Function Tests: Recent Labs  Lab 08/13/21 1059  AST 14*  ALT 10  ALKPHOS 73  BILITOT 0.5  PROT 6.6  ALBUMIN 3.2*   CBG: Recent Labs  Lab 08/13/21 1315 08/13/21 1456 08/13/21 1706 08/13/21 2127 08/14/21 0739  GLUCAP 56* 89 176* 237* 151*    Discharge time spent: greater than 30 minutes.  Signed: Deatra James, MD [facility] 08/14/2021

## 2021-08-14 NOTE — Discharge Instructions (Signed)
Medicare Outpatient Observation Notice   Patient name:  [name] Patient number:  [id number]                                                                                                                                                                       You're a hospital outpatient receiving observation services. You are not an inpatient because:    Pneumonia   You require hospital care for evaluation and/or treatment.  It is expected you will need hospital care for less than a total of two days. [email or web address]                                                                                                                                                                        Being an outpatient may affect what you pay in a hospital:   When you're a hospital outpatient, your observation stay is covered under Medicare Part B.   For Part B services, you generally pay:   A copayment for each outpatient hospital service you get. Part B copayments may vary by type of service.   20% of the Medicare-approved amount for most doctor services, after the Part B deductible.   Observation services may affect coverage and payment of your care after you leave the hospital:     If you need skilled nursing facility (SNF) care after you leave the hospital, Medicare Part A will only cover SNF care if you've had a 3-day minimum, medically necessary, inpatient hospital stay for a related illness or injury. An inpatient hospital stay begins the day the hospital admits you as an inpatient based on a doctor's order and doesn't include the day you're discharged.   If you have Medicaid, a Medicare Advantage plan or other health plan, Medicaid or the plan may have different rules for SNF coverage after you leave the hospital. Check with Medicaid or your plan.   NOTE: Medicare Part A generally doesn't cover outpatient hospital services, like  an observation stay. However, Part A will generally cover  medically necessary inpatient services if the hospital admits you as an inpatient based on a doctor's order. In most cases, you'll pay a one-time deductible for all of your inpatient hospital services for the first 60 days you're in a hospital.                                                                                                                                                                      If you have any questions about your observation services, ask the hospital staff member giving you this notice or the doctor providing your hospital care. You can also ask to speak with someone from the hospital's utilization or discharge planning department.   You can also call 1-800-MEDICARE (1-315-670-4003).  TTY users should call [phone].   Form CMS 54627-OJJK   Expiration 12/23/2023 OMB APPROVAL [id number]          Your costs for medications:     Generally, prescription and over-the-counter drugs, including "self-administered drugs," you get in a hospital outpatient setting (like an emergency department) aren't covered by Part B. "Self- administered drugs" are drugs you'd normally take on your own. For safety reasons, many hospitals don't allow you to take medications brought from home. If you have a Medicare prescription drug plan (Part D), your plan may help you pay for these drugs. You'll likely need to pay out-of- pocket for these drugs and submit a claim to your drug plan for a refund. Contact your drug plan for more information.                                                                                                                                                                        If you're enrolled in a Medicare Advantage plan (like an HMO or PPO) or other Medicare health plan (Part C), your costs and coverage may be different. Check with your plan to find out about coverage for  outpatient observation services.    If you're a Qualified Medicare Beneficiary  through your state Medicaid program, you can't be billed for Part A or Part B deductibles, coinsurance, and copayments.                                                                                                                                                                      Additional Information (Optional):   ADDLINFO                                                                                                                                                                             Please sign below to show you received and understand this notice.     Patient Sign Here                                       Date: 08/14/21 / Time:10:59 AM   CMS does not discriminate in its programs and activities. To request this publication in alternative format, please call: 1-800-MEDICARE or email:[email or web address].   Form CMS 44010-[id number]   Expiration 12/23/2023 OMB APPROVAL [id number]      Patient   Add [name] [name] Other Connected Tablet '[]'$ Save My Signature No image attached Trace Slow Corrupt Edit Data Change Template Print On Accept send to:  Patient Scan

## 2021-08-14 NOTE — Progress Notes (Signed)
AVS and social worker's paperwork placed in discharge packet. Report called and given to BillyJoe at [facility]. All questions answered to satisfaction. [facility] to transport pt with all belongings.

## 2021-08-16 ENCOUNTER — Encounter: Payer: Self-pay | Admitting: Nurse Practitioner

## 2021-08-16 ENCOUNTER — Ambulatory Visit (INDEPENDENT_AMBULATORY_CARE_PROVIDER_SITE_OTHER): Payer: Medicare Other | Admitting: Nurse Practitioner

## 2021-08-16 VITALS — BP 106/67 | HR 103 | Ht 67.0 in

## 2021-08-16 DIAGNOSIS — I10 Essential (primary) hypertension: Secondary | ICD-10-CM

## 2021-08-16 DIAGNOSIS — E1159 Type 2 diabetes mellitus with other circulatory complications: Secondary | ICD-10-CM | POA: Diagnosis not present

## 2021-08-16 DIAGNOSIS — E782 Mixed hyperlipidemia: Secondary | ICD-10-CM | POA: Diagnosis not present

## 2021-08-16 DIAGNOSIS — E041 Nontoxic single thyroid nodule: Secondary | ICD-10-CM

## 2021-08-16 LAB — POCT GLYCOSYLATED HEMOGLOBIN (HGB A1C): HbA1c, POC (controlled diabetic range): 6.2 % (ref 0.0–7.0)

## 2021-08-16 NOTE — Progress Notes (Signed)
08/16/2021  Endocrinology follow-up note      Subjective:    Patient ID: [name], female    DOB: 1946-01-17,    Past Medical History:  Diagnosis Date   Anxiety    Arthritis    Atrial fibrillation ([place])    Atrial flutter ([place])    Collagen vascular disease ([place])    COVID-19 virus infection    COVID-19+ approx 01/24/19; asymptomatic course with full recovery   Dependence on wheelchair    pivot/transfers   Depression    History of psychosis and previous suicide attempt   DVT, lower extremity, recurrent ([place])    Long-term Coumadin per Dr. Legrand Rams   Essential hypertension    GERD (gastroesophageal reflux disease)    Hemiplegia ([place]) 2010   Left side   History of stroke    Acute infarct and right cerebral white matter small vessel disease 12/10   Leg DVT (deep venous thromboembolism), acute ([place]) 2006   Orthostatic hypotension    Schizophrenia ([place])    Stroke ([place])    left sided weakness   Type 2 diabetes mellitus ([place])    Past Surgical History:  Procedure Laterality Date   BACK SURGERY     BIOPSY N/A 11/24/2014   Procedure: BIOPSY;  Surgeon: Danie Binder, MD;  Location: [facility];  Service: Endoscopy;  Laterality: N/A;   BIOPSY  05/17/2016   Procedure: BIOPSY;  Surgeon: Danie Binder, MD;  Location: [facility];  Service: Endoscopy;;  gastric biopsy   COLONOSCOPY WITH PROPOFOL N/A 11/24/2014   Dr. Rudie Meyer polyps removed/moderate sized internal hemorrhoids, tubular adenomas. Next surveillance in 3 years   ESOPHAGEAL DILATION N/A 07/23/2021   Procedure: ESOPHAGEAL DILATION;  Surgeon: Eloise Harman, DO;  Location: [facility];  Service: Endoscopy;  Laterality: N/A;   ESOPHAGOGASTRODUODENOSCOPY (EGD) WITH PROPOFOL N/A 11/24/2014   Dr. Clayburn Pert HH/patent stricture at the gastroesophageal junction, mild non-erosive gastritis, path negative for H.pylori or celiac sprue   ESOPHAGOGASTRODUODENOSCOPY (EGD) WITH PROPOFOL N/A 05/17/2016   Procedure:  ESOPHAGOGASTRODUODENOSCOPY (EGD) WITH PROPOFOL;  Surgeon: Danie Binder, MD;  [facility];  Service: Endoscopy;  Laterality: N/A;  12:45pm   ESOPHAGOGASTRODUODENOSCOPY (EGD) WITH PROPOFOL N/A 07/23/2021   Procedure: ESOPHAGOGASTRODUODENOSCOPY (EGD) WITH PROPOFOL;  Surgeon: Eloise Harman, DO;  [facility];  Service: Endoscopy;  Laterality: N/A;   GIVENS CAPSULE STUDY N/A 12/11/2014   MULTILPLE EROSION IN the stomach WITH ACTIVE OOZING. OCCASIONAL EROSIONS AND RARE ULCER SEEN IN PROXIMAL SMALL BOWEL . No masses or AVMs SEEN. NO OLD BLOOD OR FRESH BLOOD SEEN.    POLYPECTOMY N/A 11/24/2014   Procedure: POLYPECTOMY;  Surgeon: Danie Binder, MD;  Location: [facility];  Service: Endoscopy;  Laterality: N/A;   SAVORY DILATION N/A 05/17/2016   Procedure: SAVORY DILATION;  Surgeon: Danie Binder, MD;  [facility];  Service: Endoscopy;  Laterality: N/A;   Family History  Problem Relation Age of Onset   Hypertension Mother    Colon cancer Neg Hx     Social History   Socioeconomic History   Marital status: Widowed    Spouse name: Not on file   Number of children: Not on file   Years of education: Not on file   Highest education level: Not on file  Occupational History   Not on file  Tobacco Use   Smoking status: Former    Packs/day: 0.25    Years: 20.00    Total pack years: 5.00  Types: Cigarettes    Quit date: 01/24/1995    Years since quitting: 26.5   Smokeless tobacco: Never  Vaping Use   Vaping Use: Never used  Substance and Sexual Activity   Alcohol use: No    Alcohol/week: 0.0 standard drinks of alcohol   Drug use: No   Sexual activity: Never    Birth control/protection: Post-menopausal  Other Topics Concern   Not on file  Social History Narrative   Not on file   Social Determinants of Health   Financial Resource Strain: Not on file  Food Insecurity: Not on file  Transportation Needs: Not on file  Physical Activity: Not on file   Stress: Not on file  Social Connections: Not on file   Outpatient Encounter Medications as of 08/16/2021  Medication Sig   insulin detemir (LEVEMIR FLEXTOUCH) 100 UNIT/ML FlexPen INJECT 22 UNITS SUBCUTANEOUSLY AT BEDTIME.(HOLD IF BS<70: CALL MD IF BS> 400) (Patient taking differently: 22 Units at bedtime.)   NOVOLOG FLEXPEN 100 UNIT/ML FlexPen INJECT SUBCUTANEOUSLY AS FOLLOWS WITH MEALS: 90-150=10u: 151-200=11u: 201-250=12u: 251-300=13u: 301-350=14u: 351-400=15u: BS>400=16u & CALL MD. (Patient taking differently: 10-16 Units 3 (three) times daily with meals. INJECT SUBCUTANEOUSLY AS FOLLOWS WITH MEALS: 90-150=10u: 151-200=11u: 201-250=12u: 251-300=13u: 301-350=14u: 351-400=15u: BS>400=16u & CALL MD.)   TRADJENTA 5 MG TABS tablet TAKE (1) TABLET BY MOUTH ONCE DAILY. (Patient taking differently: Take 5 mg by mouth daily.)   ABILIFY 2 MG tablet Take 1 tablet (2 mg total) by mouth daily. (Patient taking differently: Take 2 mg by mouth at bedtime.)   acetaminophen (TYLENOL) 325 MG tablet Take 650 mg by mouth 3 (three) times daily as needed for mild pain or moderate pain.    albuterol (VENTOLIN HFA) 108 (90 Base) MCG/ACT inhaler Inhale 2 puffs into the lungs every 6 (six) hours as needed for wheezing or shortness of breath.   amiodarone (PACERONE) 200 MG tablet Take 1 tablet (200 mg total) by mouth daily.   apixaban (ELIQUIS) 5 MG TABS tablet Take 1 tablet (5 mg total) by mouth 2 (two) times daily.   atorvastatin (LIPITOR) 40 MG tablet Take 1 tablet by mouth at bedtime.   calcium carbonate (TUMS - DOSED IN MG ELEMENTAL CALCIUM) 500 MG chewable tablet Chew 1 tablet by mouth 4 (four) times daily as needed for heartburn.   clonazePAM (KLONOPIN) 0.5 MG tablet Take 1 tablet (0.5 mg total) by mouth 3 (three) times daily as needed for anxiety. (Patient taking differently: Take 0.5 mg by mouth daily.)   diltiazem (CARDIZEM CD) 120 MG 24 hr capsule Take 120 mg by mouth daily.   EASYMAX TEST test strip USE TO  CHECK BLOOD SUGAR FOUR TIMES DAILY.(HOLD IF BS<70: CALL MD IF BS> 400)   GOODSENSE ARTIFICIAL TEARS 0.5-0.6 % SOLN Apply to eye.   HYDROcodone-acetaminophen (NORCO) 10-325 MG tablet Take 1 tablet by mouth every 8 (eight) hours as needed for severe pain. (Patient taking differently: Take 1 tablet by mouth in the morning, at noon, and at bedtime.)   latanoprost (XALATAN) 0.005 % ophthalmic solution Place 1 drop into both eyes at bedtime.   levofloxacin (LEVAQUIN) 250 MG tablet Take 1 tablet (250 mg total) by mouth daily for 4 days.   loratadine (CLARITIN) 10 MG tablet Take 10 mg by mouth daily.   lubiprostone (AMITIZA) 8 MCG capsule Take 1 capsule by mouth daily.   metoCLOPramide (REGLAN) 5 MG tablet Take 5 mg by mouth in the morning, at noon, and at bedtime. If zofran is not working  metoprolol succinate (TOPROL-XL) 50 MG 24 hr tablet Take 1 tablet (50 mg total) by mouth daily. Take with or immediately following a meal.   midodrine (PROAMATINE) 10 MG tablet Take 10 mg by mouth 3 (three) times daily.   mirtazapine (REMERON) 30 MG tablet Take 30 mg by mouth at bedtime.   naloxegol oxalate (MOVANTIK) 12.5 MG TABS tablet Take 1 tablet (12.5 mg total) by mouth daily.   ondansetron (ZOFRAN) 4 MG tablet Take 4 mg by mouth 2 (two) times daily.   oxybutynin (DITROPAN-XL) 5 MG 24 hr tablet Take 5 mg by mouth daily.   pantoprazole (PROTONIX) 40 MG tablet Take 1 tablet (40 mg total) by mouth 2 (two) times daily before a meal.   polyethylene glycol (MIRALAX / GLYCOLAX) 17 g packet Take 17 g by mouth daily.   risperiDONE (RISPERDAL) 1 MG tablet Take 1-2 mg by mouth 2 (two) times daily. Take 1 tablet by mouth in the morning. Take 2 tablets by mouth at bedtime.   SANTYL 250 UNIT/GM ointment Apply 1 Application topically daily.   senna (SENOKOT) 8.6 MG tablet Take 1 tablet by mouth daily.   Skin Protectants, Misc. (BAZA PROTECT EX) Apply 1 Application topically in the morning and at bedtime.   No  facility-administered encounter medications on file as of 08/16/2021.   ALLERGIES: Allergies  Allergen Reactions   Sulfa Antibiotics Rash   VACCINATION STATUS: Immunization History  Administered Date(s) Administered   Moderna Covid-19 Vaccine Bivalent Booster 71yr & up 03/31/2021    Diabetes She presents for her follow-up diabetic visit. She has type 2 diabetes mellitus. Onset time: She was diagnosed at approximate age of 89years. Her disease course has been improving. There are no hypoglycemic associated symptoms. Pertinent negatives for hypoglycemia include no confusion, pallor or seizures. Associated symptoms include fatigue. Pertinent negatives for diabetes include no polydipsia, no polyphagia, no polyuria and no weight loss. There are no hypoglycemic complications. Symptoms are stable. Diabetic complications include a CVA, heart disease and nephropathy. Risk factors for coronary artery disease include dyslipidemia, diabetes mellitus, obesity, sedentary lifestyle, hypertension and post-menopausal. Current diabetic treatment includes oral agent (monotherapy) and intensive insulin program. She is compliant with treatment all of the time. Her weight is increasing steadily. She is following a generally unhealthy diet. When asked about meal planning, she reported none. She has not had a previous visit with a dietitian. She never participates in exercise. Her home blood glucose trend is decreasing steadily. Her overall blood glucose range is 110-130 mg/dl. (She presents today, accompanied by care assistant from nursing home, with her logs showing at target glycemic profile overall.  Her POCT A1c today is 6.2%, improving from last visit of 7.1%.  She has a goal of getting off her insulin shots.  There are no documented episodes of hypoglycemia.) An ACE inhibitor/angiotensin II receptor blocker is not being taken. She does not see a podiatrist.Eye exam is current.  Hypertension This is a chronic  problem. The current episode started more than 1 year ago. The problem has been resolved since onset. The problem is controlled. Pertinent negatives include no palpitations or shortness of breath. There are no associated agents to hypertension. Risk factors for coronary artery disease include dyslipidemia, diabetes mellitus, sedentary lifestyle and post-menopausal state. Past treatments include beta blockers. The current treatment provides mild improvement. There are no compliance problems.  Hypertensive end-organ damage includes kidney disease and CVA. Identifiable causes of hypertension include chronic renal disease and a thyroid problem.  Hyperlipidemia  This is a chronic problem. The current episode started more than 1 year ago. The problem is controlled. Recent lipid tests were reviewed and are normal. Exacerbating diseases include chronic renal disease and diabetes. Factors aggravating her hyperlipidemia include beta blockers. Pertinent negatives include no shortness of breath. Current antihyperlipidemic treatment includes statins. The current treatment provides moderate improvement of lipids. There are no compliance problems.  Risk factors for coronary artery disease include diabetes mellitus, dyslipidemia, hypertension, obesity and a sedentary lifestyle.  Thyroid Problem Presents for follow-up visit. Symptoms include fatigue. Patient reports no palpitations or weight loss. The symptoms have been stable. Past treatments include beta blockers (on beta blockers for other comorbidities). Prior procedures include radioiodine uptake scan. Her past medical history is significant for atrial fibrillation, diabetes and hyperlipidemia. Risk factors include family history of hyperthyroidism.     Review of systems  Constitutional: + steadily increasing body weight,  current Body mass index is 21.79 kg/m. , no fatigue, no subjective hyperthermia, no subjective hypothermia Eyes: no blurry vision, no  xerophthalmia ENT: no sore throat, no nodules palpated in throat, no dysphagia/odynophagia, no hoarseness Cardiovascular: no chest pain, no shortness of breath, no palpitations, no leg swelling Respiratory: no cough, no shortness of breath Gastrointestinal: no nausea/vomiting/diarrhea Musculoskeletal: no muscle/joint aches, essentially WC bound Skin: no rashes, no hyperemia Neurological: no tremors, no numbness, no tingling, no dizziness Psychiatric: no depression, no anxiety        Objective:    BP 106/67   Pulse (!) 103   Ht '5\' 7"'$  (1.702 m)   BMI 21.79 kg/m   Wt Readings from Last 3 Encounters:  08/14/21 139 lb 1.8 oz (63.1 kg)  08/04/21 129 lb 6.6 oz (58.7 kg)  07/22/21 129 lb 6.6 oz (58.7 kg)     BP Readings from Last 3 Encounters:  08/16/21 106/67  08/14/21 (!) 120/59  08/04/21 (!) 113/53    Physical Exam- Limited  Constitutional:  Body mass index is 21.79 kg/m., not in acute distress, normal state of mind Eyes:  EOMI, no exophthalmos Neck: Supple Cardiovascular: RRR, + murmur, rubs, or gallops, no edema Respiratory: Adequate breathing efforts, no crackles, rales, rhonchi, or wheezing Musculoskeletal: WC bound due to previous CVA Skin:  no rashes, no hyperemia Neurological: no tremor     CMP     Component Value Date/Time   NA 139 08/14/2021 0521   NA 142 12/02/2020 0858   K 3.7 08/14/2021 0521   CL 107 08/14/2021 0521   CO2 28 08/14/2021 0521   GLUCOSE 138 (H) 08/14/2021 0521   BUN 10 08/14/2021 0521   BUN 12 12/02/2020 0858   CREATININE 0.56 08/14/2021 0521   CREATININE 0.69 05/04/2020 0925   CALCIUM 7.9 (L) 08/14/2021 0521   PROT 6.6 08/13/2021 1059   PROT 7.1 12/02/2020 0858   ALBUMIN 3.2 (L) 08/13/2021 1059   ALBUMIN 4.4 12/02/2020 0858   AST 14 (L) 08/13/2021 1059   ALT 10 08/13/2021 1059   ALKPHOS 73 08/13/2021 1059   BILITOT 0.5 08/13/2021 1059   BILITOT 0.3 12/02/2020 0858   GFRNONAA >60 08/14/2021 0521   GFRNONAA 91 06/27/2018 0803    GFRAA >60 10/05/2019 0457   GFRAA 106 06/27/2018 0803     CBC    Component Value Date/Time   WBC 8.2 08/14/2021 0521   RBC 3.38 (L) 08/14/2021 0521   HGB 9.5 (L) 08/14/2021 0521   HCT 30.1 (L) 08/14/2021 0521   PLT 261 08/14/2021 0521   MCV 89.1 08/14/2021 0521  MCH 28.1 08/14/2021 0521   MCHC 31.6 08/14/2021 0521   RDW 15.3 08/14/2021 0521   LYMPHSABS 0.9 08/13/2021 1059   MONOABS 0.5 08/13/2021 1059   EOSABS 0.0 08/13/2021 1059   BASOSABS 0.0 08/13/2021 1059     Diabetic Labs (most recent): Lab Results  Component Value Date   HGBA1C 6.2 08/16/2021   HGBA1C 7.1 (H) 03/22/2021   HGBA1C 7.5 (A) 12/09/2020    Lipid Panel     Component Value Date/Time   CHOL 107 12/02/2020 0858   TRIG 82 12/02/2020 0858   HDL 46 12/02/2020 0858   CHOLHDL 2.3 12/02/2020 0858   CHOLHDL 3.8 10/05/2019 0457   VLDL 30 10/05/2019 0457   LDLCALC 45 12/02/2020 0858   LABVLDL 16 12/02/2020 0858     Lab Results  Component Value Date   TSH 0.785 07/24/2021   TSH 1.076 04/23/2021   TSH 1.370 12/02/2020   TSH 0.869 06/03/2020   TSH 0.293 (L) 04/18/2020   TSH 0.578 10/05/2019   TSH 1.138 03/29/2017   TSH 0.538 02/13/2016   FREET4 1.80 (H) 07/24/2021   FREET4 1.35 12/02/2020   FREET4 1.13 06/03/2020   FREET4 1.52 (H) 04/19/2020     Thyroid US from 09/01/20 CLINICAL DATA:  Hyperthyroidism, right thyroid cold nodule by nuclear medicine thyroid scan   EXAM: THYROID ULTRASOUND   TECHNIQUE: Ultrasound examination of the thyroid gland and adjacent soft tissues was performed.   COMPARISON:  06/02/2020 nuclear medicine thyroid scan   FINDINGS: Parenchymal Echotexture: Moderately heterogenous   Isthmus: 5 mm   Right lobe: 3.6 x 2.3 x 2.3 cm   Left lobe: 3.7 x 1.8 x 1.4 cm   _________________________________________________________   Estimated total number of nodules >/= 1 cm: 2   Number of spongiform nodules >/=  2 cm not described below (TR1): 0   Number of mixed cystic  and solid nodules >/= 1.5 cm not described below (TR2): 0   _________________________________________________________   Nodule # 1:   Location: Right; Mid   Maximum size: 2.1 cm; Other 2 dimensions: 1.7 x 1.6 cm   Composition: solid/almost completely solid (2)   Echogenicity: isoechoic (1)   Shape: not taller-than-wide (0)   Margins: ill-defined (0)   Echogenic foci: punctate echogenic foci (3)   ACR TI-RADS total points: 6.   ACR TI-RADS risk category: TR4 (4-6 points).   ACR TI-RADS recommendations:   **Given size (>/= 1.5 cm) and appearance, fine needle aspiration of this moderately suspicious nodule should be considered based on TI-RADS criteria.   _________________________________________________________   Nodule # 2:   Location: Left; Mid   Maximum size: 1.4 cm; Other 2 dimensions: 1.3 x 1.3 cm   Composition: mixed cystic and solid (1)   Echogenicity: hypoechoic (2)   Shape: not taller-than-wide (0)   Margins: ill-defined (0)   Echogenic foci: none (0)   ACR TI-RADS total points: 3.   ACR TI-RADS risk category: TR3 (3 points).   ACR TI-RADS recommendations:   Given size (<1.4 cm) and appearance, this nodule does NOT meet TI-RADS criteria for biopsy or dedicated follow-up.   _________________________________________________________   Nonspecific thyroid heterogeneity. No hypervascularity. No regional adenopathy.   IMPRESSION: 2.1 cm right mid thyroid TR 4 nodule meets criteria for biopsy as above. This correlates with the nuclear medicine cold nodule.   The above is in keeping with the ACR TI-RADS recommendations - J Am Coll Radiol 2017;14:587-595.     Electronically Signed   By: Jerilynn Mages.  Shick  M.D.   On: 09/01/2020 11:26   Assessment & Plan:   1) Thyroid Nodule  she is being seen at a kind request of [name], Normajean Baxter, MD.  During evaluation for hyperthyroidism, her uptake and scan was identified a cold nodule in right mid  thyroid, recommending dedicated thyroid ultrasound to assess completely.  She recently had her thyroid ultrasound which recommended FNA of the moderately suspicious nodule.    The biopsy results were sent to [facility] for further evaluation and determined to be benign.  No further follow up needed at this time.  2) Type 2 diabetes mellitus with vascular disease ([place])   Her diabetes is complicated by recurrent CVA.   She presents today, accompanied by care assistant from nursing home, with her logs showing at target glycemic profile overall.  Her POCT A1c today is 6.2%, improving from last visit of 7.1%.  She has a goal of getting off her insulin shots.  There are no documented episodes of hypoglycemia.   - Patient remains at a high risk for more acute and chronic complications of diabetes which include CAD, CVA, CKD, retinopathy, and neuropathy. These are all discussed in detail with the patient.   - Nutritional counseling repeated at each appointment due to patients tendency to fall back in to old habits.  - The patient admits there is a room for improvement in their diet and drink choices. -  Suggestion is made for the patient to avoid simple carbohydrates from their diet including Cakes, Sweet Desserts / Pastries, Ice Cream, Soda (diet and regular), Sweet Tea, Candies, Chips, Cookies, Sweet Pastries, Store Bought Juices, Alcohol in Excess of 1-2 drinks a day, Artificial Sweeteners, Coffee Creamer, and "Sugar-free" Products. This will help patient to have stable blood glucose profile and potentially avoid unintended weight gain.   - I encouraged the patient to switch to unprocessed or minimally processed complex starch and increased protein intake (animal or plant source), fruits, and vegetables.   - Patient is advised to stick to a routine mealtimes to eat 3 meals a day and avoid unnecessary snacks (to snack only to correct hypoglycemia).   - I have approached patient with the following  individualized plan to manage diabetes and patient agrees.   -She is advised to continue Levemir 22 units SQ nightly and continue Humalog 10-16 units TID with meals if glucose is above 90 and she is eating (Specific instructions on how to titrate insulin dosage based on glucose readings given to patient in writing).   She can continue her Tradjenta 5 mg po daily.  May look at potentially weaning her off short-acting insulin starting at next visit.   -She is encouraged to continue monitoring blood glucose 4 times daily, before meals and before bed, and to call the clinic if she has readings less than 70 or above 300 for 3 tests in a row.    - she is not a suitable candidate for metformin nor SGLT2 inhibitor therapy.     - Patient specific target  for A1c; LDL, HDL, Triglycerides, and  Waist Circumference were discussed in detail.  -Patient is advised to maintain close follow up with Carrolyn Meiers, MD for primary care needs.      I spent 30 minutes in the care of the patient today including review of labs from [place], Lipids, Thyroid Function, Hematology (current and previous including abstractions from other facilities); face-to-face time discussing  her blood glucose readings/logs, discussing hypoglycemia and hyperglycemia episodes and symptoms, medications doses,  her options of short and long term treatment based on the latest standards of care / guidelines;  discussion about incorporating lifestyle medicine;  and documenting the encounter. Risk reduction counseling performed per USPSTF guidelines to reduce obesity and cardiovascular risk factors.     Please refer to Patient Instructions for Blood Glucose Monitoring and Insulin/Medications Dosing Guide"  in media tab for additional information. Please  also refer to " Patient Self Inventory" in the Media  tab for reviewed elements of pertinent patient history.  [name] participated in the discussions, expressed understanding, and  voiced agreement with the above plans.  All questions were answered to her satisfaction. she is encouraged to contact clinic should she have any questions or concerns prior to her return visit.    Follow up plan: Return in about 4 months (around 12/17/2021) for Diabetes F/U with A1c in office, Previsit labs, Bring meter and logs.   Rayetta Pigg, [facility] [address] [place], [place] [address] Phone: [phone] Fax: [phone]  08/16/2021, 9:43 AM

## 2021-08-18 LAB — CULTURE, BLOOD (ROUTINE X 2)
Culture: NO GROWTH
Culture: NO GROWTH

## 2021-08-29 ENCOUNTER — Other Ambulatory Visit (HOSPITAL_COMMUNITY): Payer: Self-pay | Admitting: Internal Medicine

## 2021-08-29 ENCOUNTER — Ambulatory Visit (HOSPITAL_COMMUNITY)
Admission: RE | Admit: 2021-08-29 | Discharge: 2021-08-29 | Disposition: A | Discharge status: Home / Self Care | Payer: Medicare Other | Source: Ambulatory Visit | Attending: Internal Medicine | Admitting: Internal Medicine

## 2021-08-29 DIAGNOSIS — J189 Pneumonia, unspecified organism: Secondary | ICD-10-CM

## 2021-09-06 ENCOUNTER — Telehealth: Payer: Self-pay | Admitting: Cardiology

## 2021-09-06 NOTE — Telephone Encounter (Signed)
Kathlee Nations, from [facility] called about patient's amiodarone, diltiazem and movantik having a drug interaction.  She wants to make sure it's okay for patient to take these medications.  If someone can give her a call letting her know it's okay.  She can be reached at [phone].

## 2021-09-07 NOTE — Telephone Encounter (Signed)
Returned call to [place]. No answer. Left msg to call back.

## 2021-09-07 NOTE — Telephone Encounter (Signed)
Kathlee Nations from [facility] returning a call

## 2021-09-08 NOTE — Telephone Encounter (Signed)
Nurse Kathlee Nations notified and voiced understanding.

## 2021-09-08 NOTE — Telephone Encounter (Signed)
Diltiazem and Movantik interact and typically should not be used together. If they do need to both be used, Movantik dose should be lower at 12.'5mg'$  daily which is what pt is being prescribed, and pt should be monitored for signs of opiate withdrawal (Movantik is approved for opioid-induced constipation but the only opioid on her med list is prn Norco, monitoring may not be as necessary if her opioid use is low).

## 2021-09-13 ENCOUNTER — Other Ambulatory Visit: Payer: Self-pay | Admitting: Nurse Practitioner

## 2021-09-13 ENCOUNTER — Emergency Department (HOSPITAL_COMMUNITY): Payer: Medicare Other

## 2021-09-13 ENCOUNTER — Inpatient Hospital Stay (HOSPITAL_COMMUNITY)
Admission: EM | Admit: 2021-09-13 | Discharge: 2021-09-19 | DRG: 871 | Disposition: A | Discharge status: Home Health | Payer: Medicare Other | Source: Skilled Nursing Facility | Attending: Internal Medicine | Admitting: Internal Medicine

## 2021-09-13 ENCOUNTER — Encounter (HOSPITAL_COMMUNITY): Payer: Self-pay

## 2021-09-13 ENCOUNTER — Other Ambulatory Visit: Payer: Self-pay

## 2021-09-13 DIAGNOSIS — I10 Essential (primary) hypertension: Secondary | ICD-10-CM | POA: Diagnosis present

## 2021-09-13 DIAGNOSIS — I69354 Hemiplegia and hemiparesis following cerebral infarction affecting left non-dominant side: Secondary | ICD-10-CM

## 2021-09-13 DIAGNOSIS — Z7901 Long term (current) use of anticoagulants: Secondary | ICD-10-CM

## 2021-09-13 DIAGNOSIS — Z87891 Personal history of nicotine dependence: Secondary | ICD-10-CM

## 2021-09-13 DIAGNOSIS — Z20822 Contact with and (suspected) exposure to covid-19: Secondary | ICD-10-CM | POA: Diagnosis present

## 2021-09-13 DIAGNOSIS — J209 Acute bronchitis, unspecified: Secondary | ICD-10-CM | POA: Diagnosis not present

## 2021-09-13 DIAGNOSIS — E0590 Thyrotoxicosis, unspecified without thyrotoxic crisis or storm: Secondary | ICD-10-CM | POA: Diagnosis present

## 2021-09-13 DIAGNOSIS — Z8701 Personal history of pneumonia (recurrent): Secondary | ICD-10-CM

## 2021-09-13 DIAGNOSIS — Z7189 Other specified counseling: Secondary | ICD-10-CM

## 2021-09-13 DIAGNOSIS — K219 Gastro-esophageal reflux disease without esophagitis: Secondary | ICD-10-CM | POA: Diagnosis present

## 2021-09-13 DIAGNOSIS — Z7984 Long term (current) use of oral hypoglycemic drugs: Secondary | ICD-10-CM

## 2021-09-13 DIAGNOSIS — I110 Hypertensive heart disease with heart failure: Secondary | ICD-10-CM | POA: Diagnosis present

## 2021-09-13 DIAGNOSIS — E1165 Type 2 diabetes mellitus with hyperglycemia: Secondary | ICD-10-CM | POA: Diagnosis present

## 2021-09-13 DIAGNOSIS — E876 Hypokalemia: Secondary | ICD-10-CM | POA: Diagnosis present

## 2021-09-13 DIAGNOSIS — F419 Anxiety disorder, unspecified: Secondary | ICD-10-CM | POA: Diagnosis present

## 2021-09-13 DIAGNOSIS — L89321 Pressure ulcer of left buttock, stage 1: Secondary | ICD-10-CM | POA: Diagnosis present

## 2021-09-13 DIAGNOSIS — Z8616 Personal history of COVID-19: Secondary | ICD-10-CM | POA: Diagnosis not present

## 2021-09-13 DIAGNOSIS — I421 Obstructive hypertrophic cardiomyopathy: Secondary | ICD-10-CM | POA: Diagnosis present

## 2021-09-13 DIAGNOSIS — N390 Urinary tract infection, site not specified: Secondary | ICD-10-CM | POA: Diagnosis present

## 2021-09-13 DIAGNOSIS — Y92239 Unspecified place in hospital as the place of occurrence of the external cause: Secondary | ICD-10-CM | POA: Diagnosis not present

## 2021-09-13 DIAGNOSIS — Z8249 Family history of ischemic heart disease and other diseases of the circulatory system: Secondary | ICD-10-CM

## 2021-09-13 DIAGNOSIS — L89221 Pressure ulcer of left hip, stage 1: Secondary | ICD-10-CM | POA: Diagnosis present

## 2021-09-13 DIAGNOSIS — E871 Hypo-osmolality and hyponatremia: Secondary | ICD-10-CM | POA: Diagnosis present

## 2021-09-13 DIAGNOSIS — Z66 Do not resuscitate: Secondary | ICD-10-CM | POA: Diagnosis not present

## 2021-09-13 DIAGNOSIS — Z993 Dependence on wheelchair: Secondary | ICD-10-CM

## 2021-09-13 DIAGNOSIS — T444X5A Adverse effect of predominantly alpha-adrenoreceptor agonists, initial encounter: Secondary | ICD-10-CM | POA: Diagnosis not present

## 2021-09-13 DIAGNOSIS — I480 Paroxysmal atrial fibrillation: Secondary | ICD-10-CM | POA: Diagnosis present

## 2021-09-13 DIAGNOSIS — F209 Schizophrenia, unspecified: Secondary | ICD-10-CM | POA: Diagnosis present

## 2021-09-13 DIAGNOSIS — I4819 Other persistent atrial fibrillation: Secondary | ICD-10-CM | POA: Diagnosis present

## 2021-09-13 DIAGNOSIS — J9601 Acute respiratory failure with hypoxia: Secondary | ICD-10-CM | POA: Diagnosis present

## 2021-09-13 DIAGNOSIS — F1120 Opioid dependence, uncomplicated: Secondary | ICD-10-CM | POA: Diagnosis present

## 2021-09-13 DIAGNOSIS — R4182 Altered mental status, unspecified: Secondary | ICD-10-CM

## 2021-09-13 DIAGNOSIS — A499 Bacterial infection, unspecified: Secondary | ICD-10-CM | POA: Diagnosis not present

## 2021-09-13 DIAGNOSIS — I5031 Acute diastolic (congestive) heart failure: Secondary | ICD-10-CM | POA: Diagnosis present

## 2021-09-13 DIAGNOSIS — L8990 Pressure ulcer of unspecified site, unspecified stage: Secondary | ICD-10-CM | POA: Insufficient documentation

## 2021-09-13 DIAGNOSIS — Z79899 Other long term (current) drug therapy: Secondary | ICD-10-CM

## 2021-09-13 DIAGNOSIS — G8929 Other chronic pain: Secondary | ICD-10-CM | POA: Diagnosis present

## 2021-09-13 DIAGNOSIS — Z794 Long term (current) use of insulin: Secondary | ICD-10-CM

## 2021-09-13 DIAGNOSIS — G9341 Metabolic encephalopathy: Secondary | ICD-10-CM | POA: Diagnosis present

## 2021-09-13 DIAGNOSIS — B9620 Unspecified Escherichia coli [E. coli] as the cause of diseases classified elsewhere: Secondary | ICD-10-CM | POA: Diagnosis present

## 2021-09-13 DIAGNOSIS — J189 Pneumonia, unspecified organism: Secondary | ICD-10-CM | POA: Diagnosis present

## 2021-09-13 DIAGNOSIS — T8089XA Other complications following infusion, transfusion and therapeutic injection, initial encounter: Secondary | ICD-10-CM | POA: Diagnosis not present

## 2021-09-13 DIAGNOSIS — E872 Acidosis, unspecified: Secondary | ICD-10-CM

## 2021-09-13 DIAGNOSIS — A419 Sepsis, unspecified organism: Principal | ICD-10-CM

## 2021-09-13 DIAGNOSIS — I82409 Acute embolism and thrombosis of unspecified deep veins of unspecified lower extremity: Secondary | ICD-10-CM | POA: Diagnosis present

## 2021-09-13 DIAGNOSIS — R6521 Severe sepsis with septic shock: Secondary | ICD-10-CM | POA: Diagnosis present

## 2021-09-13 DIAGNOSIS — I422 Other hypertrophic cardiomyopathy: Secondary | ICD-10-CM | POA: Diagnosis not present

## 2021-09-13 DIAGNOSIS — K222 Esophageal obstruction: Secondary | ICD-10-CM | POA: Diagnosis present

## 2021-09-13 DIAGNOSIS — R1310 Dysphagia, unspecified: Secondary | ICD-10-CM | POA: Diagnosis present

## 2021-09-13 DIAGNOSIS — Z86718 Personal history of other venous thrombosis and embolism: Secondary | ICD-10-CM

## 2021-09-13 DIAGNOSIS — F32A Depression, unspecified: Secondary | ICD-10-CM | POA: Diagnosis present

## 2021-09-13 DIAGNOSIS — I4891 Unspecified atrial fibrillation: Secondary | ICD-10-CM | POA: Diagnosis not present

## 2021-09-13 DIAGNOSIS — Z881 Allergy status to other antibiotic agents status: Secondary | ICD-10-CM

## 2021-09-13 DIAGNOSIS — T801XXA Vascular complications following infusion, transfusion and therapeutic injection, initial encounter: Secondary | ICD-10-CM

## 2021-09-13 DIAGNOSIS — Z9151 Personal history of suicidal behavior: Secondary | ICD-10-CM

## 2021-09-13 DIAGNOSIS — I959 Hypotension, unspecified: Secondary | ICD-10-CM

## 2021-09-13 LAB — CBC WITH DIFFERENTIAL/PLATELET
Abs Immature Granulocytes: 0.09 10*3/uL — ABNORMAL HIGH (ref 0.00–0.07)
Basophils Absolute: 0 10*3/uL (ref 0.0–0.1)
Basophils Relative: 0 %
Eosinophils Absolute: 0 10*3/uL (ref 0.0–0.5)
Eosinophils Relative: 0 %
HCT: 31.3 % — ABNORMAL LOW (ref 36.0–46.0)
Hemoglobin: 10 g/dL — ABNORMAL LOW (ref 12.0–15.0)
Immature Granulocytes: 1 %
Lymphocytes Relative: 7 %
Lymphs Abs: 1.1 10*3/uL (ref 0.7–4.0)
MCH: 27.8 pg (ref 26.0–34.0)
MCHC: 31.9 g/dL (ref 30.0–36.0)
MCV: 86.9 fL (ref 80.0–100.0)
Monocytes Absolute: 0.9 10*3/uL (ref 0.1–1.0)
Monocytes Relative: 5 %
Neutro Abs: 15 10*3/uL — ABNORMAL HIGH (ref 1.7–7.7)
Neutrophils Relative %: 87 %
Platelets: 273 10*3/uL (ref 150–400)
RBC: 3.6 MIL/uL — ABNORMAL LOW (ref 3.87–5.11)
RDW: 15 % (ref 11.5–15.5)
WBC: 17.1 10*3/uL — ABNORMAL HIGH (ref 4.0–10.5)
nRBC: 0 % (ref 0.0–0.2)

## 2021-09-13 LAB — COMPREHENSIVE METABOLIC PANEL
ALT: 19 U/L (ref 0–44)
AST: 23 U/L (ref 15–41)
Albumin: 3.5 g/dL (ref 3.5–5.0)
Alkaline Phosphatase: 84 U/L (ref 38–126)
Anion gap: 11 (ref 5–15)
BUN: 17 mg/dL (ref 8–23)
CO2: 25 mmol/L (ref 22–32)
Calcium: 8.3 mg/dL — ABNORMAL LOW (ref 8.9–10.3)
Chloride: 98 mmol/L (ref 98–111)
Creatinine, Ser: 0.99 mg/dL (ref 0.44–1.00)
GFR, Estimated: 59 mL/min — ABNORMAL LOW (ref >60)
Glucose, Bld: 158 mg/dL — ABNORMAL HIGH (ref 70–99)
Potassium: 4.1 mmol/L (ref 3.5–5.1)
Sodium: 134 mmol/L — ABNORMAL LOW (ref 135–145)
Total Bilirubin: 0.6 mg/dL (ref 0.3–1.2)
Total Protein: 7.5 g/dL (ref 6.5–8.1)

## 2021-09-13 LAB — URINALYSIS, ROUTINE W REFLEX MICROSCOPIC
Bilirubin Urine: NEGATIVE
Glucose, UA: NEGATIVE mg/dL
Hgb urine dipstick: NEGATIVE
Ketones, ur: NEGATIVE mg/dL
Nitrite: POSITIVE — AB
Protein, ur: 30 mg/dL — AB
Specific Gravity, Urine: 1.017 (ref 1.005–1.030)
WBC, UA: >50 WBC/hpf — ABNORMAL HIGH (ref 0–5)
pH: 6 (ref 5.0–8.0)

## 2021-09-13 LAB — BLOOD GAS, VENOUS
Acid-Base Excess: 2.9 mmol/L — ABNORMAL HIGH (ref 0.0–2.0)
Bicarbonate: 29.3 mmol/L — ABNORMAL HIGH (ref 20.0–28.0)
Drawn by: 37160
O2 Saturation: 43.5 %
Patient temperature: 38
pCO2, Ven: 55 mmHg (ref 44–60)
pH, Ven: 7.34 (ref 7.25–7.43)
pO2, Ven: 33 mmHg (ref 32–45)

## 2021-09-13 LAB — RESP PANEL BY RT-PCR (FLU A&B, COVID) ARPGX2
Influenza A by PCR: NEGATIVE
Influenza B by PCR: NEGATIVE
SARS Coronavirus 2 by RT PCR: NEGATIVE

## 2021-09-13 LAB — PROTIME-INR
INR: 1.6 — ABNORMAL HIGH (ref 0.8–1.2)
Prothrombin Time: 18.8 seconds — ABNORMAL HIGH (ref 11.4–15.2)

## 2021-09-13 LAB — LACTIC ACID, PLASMA
Lactic Acid, Venous: 1.5 mmol/L (ref 0.5–1.9)
Lactic Acid, Venous: 2.3 mmol/L (ref 0.5–1.9)

## 2021-09-13 LAB — BRAIN NATRIURETIC PEPTIDE: B Natriuretic Peptide: 229 pg/mL — ABNORMAL HIGH (ref 0.0–100.0)

## 2021-09-13 LAB — TROPONIN I (HIGH SENSITIVITY): Troponin I (High Sensitivity): 5 ng/L (ref <18)

## 2021-09-13 MED ORDER — FENTANYL 2500MCG IN NS 250ML (10MCG/ML) PREMIX INFUSION
0.0000 ug/h | INTRAVENOUS | Status: DC
  Administered 2021-09-13: 25 ug/h via INTRAVENOUS
  Administered 2021-09-14: 150 ug/h via INTRAVENOUS
  Filled 2021-09-13 (×3): qty 250

## 2021-09-13 MED ORDER — ACETAMINOPHEN 650 MG RE SUPP
650.0000 mg | Freq: Once | RECTAL | Status: AC
  Administered 2021-09-13: 650 mg via RECTAL
  Filled 2021-09-13: qty 1

## 2021-09-13 MED ORDER — PROPOFOL 1000 MG/100ML IV EMUL: 5.0000 ug/kg/min | INTRAVENOUS | Status: DC

## 2021-09-13 MED ORDER — ETOMIDATE 2 MG/ML IV SOLN: 20.0000 mg | Freq: Once | INTRAVENOUS | Status: AC

## 2021-09-13 MED ORDER — PIPERACILLIN-TAZOBACTAM 3.375 G IVPB 30 MIN
3.3750 g | Freq: Once | INTRAVENOUS | Status: AC
Start: 2021-09-13 — End: 2021-09-13
  Administered 2021-09-13: 3.375 g via INTRAVENOUS
  Filled 2021-09-13: qty 50

## 2021-09-13 MED ORDER — MIDAZOLAM-SODIUM CHLORIDE 100-0.9 MG/100ML-% IV SOLN: 0.5000 mg/h | INTRAVENOUS | Status: DC

## 2021-09-13 MED ORDER — ROCURONIUM BROMIDE 50 MG/5ML IV SOLN: 80.0000 mg | Freq: Once | INTRAVENOUS | Status: AC

## 2021-09-13 MED ORDER — MIDAZOLAM-SODIUM CHLORIDE 100-0.9 MG/100ML-% IV SOLN
0.5000 mg/h | INTRAVENOUS | Status: DC
  Administered 2021-09-13: 0.5 mg/h via INTRAVENOUS
  Filled 2021-09-13: qty 100

## 2021-09-13 MED ORDER — NOREPINEPHRINE 4 MG/250ML-% IV SOLN
2.0000 ug/min | INTRAVENOUS | Status: DC
  Administered 2021-09-13: 2 ug/min via INTRAVENOUS
  Administered 2021-09-14: 8 ug/min via INTRAVENOUS
  Administered 2021-09-14: 5 ug/min via INTRAVENOUS
  Administered 2021-09-15: 3 ug/min via INTRAVENOUS
  Filled 2021-09-13 (×3): qty 250

## 2021-09-13 MED ORDER — PROPOFOL 1000 MG/100ML IV EMUL
INTRAVENOUS | Status: AC
  Administered 2021-09-13: 5 ug/kg/min via INTRAVENOUS
  Filled 2021-09-13: qty 100

## 2021-09-13 MED ORDER — VANCOMYCIN HCL 1250 MG/250ML IV SOLN
1250.0000 mg | Freq: Once | INTRAVENOUS | Status: AC
Start: 2021-09-13 — End: 2021-09-14
  Administered 2021-09-13: 1250 mg via INTRAVENOUS
  Filled 2021-09-13: qty 250

## 2021-09-13 MED ORDER — ETOMIDATE 2 MG/ML IV SOLN
INTRAVENOUS | Status: AC
  Administered 2021-09-13: 20 mg
  Filled 2021-09-13: qty 20

## 2021-09-13 MED ORDER — SODIUM CHLORIDE 0.9 % IV BOLUS: 30.0000 mL/kg | Freq: Once | INTRAVENOUS | Status: DC

## 2021-09-13 MED ORDER — SODIUM CHLORIDE 0.9 % IV SOLN
250.0000 mL | INTRAVENOUS | Status: DC
  Administered 2021-09-13: 250 mL via INTRAVENOUS

## 2021-09-13 MED ORDER — LACTATED RINGERS IV BOLUS
1000.0000 mL | Freq: Once | INTRAVENOUS | Status: AC
  Administered 2021-09-13: 1000 mL via INTRAVENOUS

## 2021-09-13 MED ORDER — ROCURONIUM BROMIDE 10 MG/ML (PF) SYRINGE
PREFILLED_SYRINGE | INTRAVENOUS | Status: AC
  Administered 2021-09-13: 80 mg
  Filled 2021-09-13: qty 10

## 2021-09-13 MED ORDER — NOREPINEPHRINE 4 MG/250ML-% IV SOLN
INTRAVENOUS | Status: AC
  Filled 2021-09-13: qty 250

## 2021-09-13 MED ORDER — FENTANYL 2500MCG IN NS 250ML (10MCG/ML) PREMIX INFUSION: 0.0000 ug/h | INTRAVENOUS | Status: DC

## 2021-09-13 NOTE — ED Notes (Signed)
EDP and Respiratory at bedside.   20 Etomidate @ 9:27 80 ROC @ 937  Intubated '@9'$ :38 Postitive color change noted 22 at Lip Bilateral breath sounds @ 939  12 NG tube placed at 953 Temp foly 33f @ 2207

## 2021-09-13 NOTE — ED Triage Notes (Signed)
Fever, wheezing, short of breath, that started 20 or 30 min ago. Feeling weak.

## 2021-09-13 NOTE — Progress Notes (Signed)
PHARMACY ANTIBIOTIC CONSULT NOTE   [name] a 76 y.o. female admitted on 09/13/21 with fever, SOB, weakness- concern for PNA .  Pharmacy has been consulted for Vancomycin dosing.  Tm 101.2  Plan: Zosyn 3.375 g IV once per MD  Vancomycin 1,250 mg IV x1- F/U CMET to determine additional doses  Monitor renal function, clinical status, de-escalation, C/S, levels as indicated , F/U MRSA PCR   Allergies:  Allergies  Allergen Reactions   Sulfa Antibiotics Rash    Filed Weights   09/13/21 2112 09/13/21 2151  Weight: 63 kg (139 lb) 62.1 kg (137 lb)       Latest Ref Rng & Units 08/14/2021    5:21 AM 08/13/2021   10:59 AM 08/04/2021   10:52 AM  CBC  WBC 4.0 - 10.5 K/uL 8.2  12.3  6.5   Hemoglobin 12.0 - 15.0 g/dL 9.5  10.7  11.2   Hematocrit 36.0 - 46.0 % 30.1  33.6  36.2   Platelets 150 - 400 K/uL 261  295  318     Antibiotics Given (last 72 hours)     None       Antimicrobials this admission: Zosyn 8/22 Vancom 8/22  Microbiology results: 8/22 Bcx: sent 8/22 MRSA PCR: sent   Thank you for allowing pharmacy to be a part of this patient's care.  Adria Dill, PharmD PGY-2 Infectious Diseases Resident  09/13/2021 10:00 PM

## 2021-09-14 ENCOUNTER — Inpatient Hospital Stay (HOSPITAL_COMMUNITY): Payer: Medicare Other

## 2021-09-14 DIAGNOSIS — J9601 Acute respiratory failure with hypoxia: Secondary | ICD-10-CM | POA: Diagnosis not present

## 2021-09-14 DIAGNOSIS — R6521 Severe sepsis with septic shock: Secondary | ICD-10-CM

## 2021-09-14 DIAGNOSIS — A419 Sepsis, unspecified organism: Secondary | ICD-10-CM | POA: Diagnosis not present

## 2021-09-14 DIAGNOSIS — E1165 Type 2 diabetes mellitus with hyperglycemia: Secondary | ICD-10-CM

## 2021-09-14 DIAGNOSIS — N390 Urinary tract infection, site not specified: Secondary | ICD-10-CM | POA: Diagnosis not present

## 2021-09-14 DIAGNOSIS — K219 Gastro-esophageal reflux disease without esophagitis: Secondary | ICD-10-CM | POA: Diagnosis not present

## 2021-09-14 DIAGNOSIS — I5031 Acute diastolic (congestive) heart failure: Secondary | ICD-10-CM

## 2021-09-14 DIAGNOSIS — G9341 Metabolic encephalopathy: Secondary | ICD-10-CM

## 2021-09-14 DIAGNOSIS — Z794 Long term (current) use of insulin: Secondary | ICD-10-CM

## 2021-09-14 DIAGNOSIS — R4182 Altered mental status, unspecified: Secondary | ICD-10-CM

## 2021-09-14 LAB — COMPREHENSIVE METABOLIC PANEL
ALT: 16 U/L (ref 0–44)
AST: 19 U/L (ref 15–41)
Albumin: 2.8 g/dL — ABNORMAL LOW (ref 3.5–5.0)
Alkaline Phosphatase: 64 U/L (ref 38–126)
Anion gap: 7 (ref 5–15)
BUN: 15 mg/dL (ref 8–23)
CO2: 25 mmol/L (ref 22–32)
Calcium: 7.7 mg/dL — ABNORMAL LOW (ref 8.9–10.3)
Chloride: 103 mmol/L (ref 98–111)
Creatinine, Ser: 0.79 mg/dL (ref 0.44–1.00)
GFR, Estimated: >60 mL/min (ref >60)
Glucose, Bld: 196 mg/dL — ABNORMAL HIGH (ref 70–99)
Potassium: 4 mmol/L (ref 3.5–5.1)
Sodium: 135 mmol/L (ref 135–145)
Total Bilirubin: 1 mg/dL (ref 0.3–1.2)
Total Protein: 6 g/dL — ABNORMAL LOW (ref 6.5–8.1)

## 2021-09-14 LAB — EXPECTORATED SPUTUM ASSESSMENT W GRAM STAIN, RFLX TO RESP C

## 2021-09-14 LAB — CBC WITH DIFFERENTIAL/PLATELET
Abs Immature Granulocytes: 0.04 10*3/uL (ref 0.00–0.07)
Basophils Absolute: 0 10*3/uL (ref 0.0–0.1)
Basophils Relative: 0 %
Eosinophils Absolute: 0 10*3/uL (ref 0.0–0.5)
Eosinophils Relative: 0 %
HCT: 29.5 % — ABNORMAL LOW (ref 36.0–46.0)
Hemoglobin: 9.4 g/dL — ABNORMAL LOW (ref 12.0–15.0)
Immature Granulocytes: 0 %
Lymphocytes Relative: 9 %
Lymphs Abs: 1.5 10*3/uL (ref 0.7–4.0)
MCH: 27.7 pg (ref 26.0–34.0)
MCHC: 31.9 g/dL (ref 30.0–36.0)
MCV: 87 fL (ref 80.0–100.0)
Monocytes Absolute: 0.6 10*3/uL (ref 0.1–1.0)
Monocytes Relative: 4 %
Neutro Abs: 13.6 10*3/uL — ABNORMAL HIGH (ref 1.7–7.7)
Neutrophils Relative %: 87 %
Platelets: 216 10*3/uL (ref 150–400)
RBC: 3.39 MIL/uL — ABNORMAL LOW (ref 3.87–5.11)
RDW: 15.1 % (ref 11.5–15.5)
WBC: 15.7 10*3/uL — ABNORMAL HIGH (ref 4.0–10.5)
nRBC: 0 % (ref 0.0–0.2)

## 2021-09-14 LAB — BLOOD GAS, ARTERIAL
Acid-Base Excess: 3.2 mmol/L — ABNORMAL HIGH (ref 0.0–2.0)
Acid-Base Excess: 3.6 mmol/L — ABNORMAL HIGH (ref 0.0–2.0)
Bicarbonate: 27.7 mmol/L (ref 20.0–28.0)
Bicarbonate: 27.9 mmol/L (ref 20.0–28.0)
Drawn by: 21310
FIO2: 60 %
FIO2: 60 %
O2 Saturation: 100 %
O2 Saturation: 99.6 %
Patient temperature: 37.6
Patient temperature: 38.1
pCO2 arterial: 41 mmHg (ref 32–48)
pCO2 arterial: 43 mmHg (ref 32–48)
pH, Arterial: 7.42 (ref 7.35–7.45)
pH, Arterial: 7.44 (ref 7.35–7.45)
pO2, Arterial: 131 mmHg — ABNORMAL HIGH (ref 83–108)
pO2, Arterial: 132 mmHg — ABNORMAL HIGH (ref 83–108)

## 2021-09-14 LAB — ECHOCARDIOGRAM COMPLETE
AR max vel: 2.31 cm2
AV Area VTI: 2.37 cm2
AV Area mean vel: 2.4 cm2
AV Mean grad: 7.5 mmHg
AV Peak grad: 16.2 mmHg
Ao pk vel: 2.02 m/s
Area-P 1/2: 3.95 cm2
Height: 67 in
MV VTI: 3.43 cm2
S' Lateral: 1.8 cm
Weight: 2338.64 oz

## 2021-09-14 LAB — PROCALCITONIN: Procalcitonin: 0.18 ng/mL

## 2021-09-14 LAB — PROTIME-INR
INR: 1.6 — ABNORMAL HIGH (ref 0.8–1.2)
Prothrombin Time: 19.2 seconds — ABNORMAL HIGH (ref 11.4–15.2)

## 2021-09-14 LAB — GLUCOSE, CAPILLARY
Glucose-Capillary: 181 mg/dL — ABNORMAL HIGH (ref 70–99)
Glucose-Capillary: 195 mg/dL — ABNORMAL HIGH (ref 70–99)
Glucose-Capillary: 184 mg/dL — ABNORMAL HIGH (ref 70–99)
Glucose-Capillary: 183 mg/dL — ABNORMAL HIGH (ref 70–99)
Glucose-Capillary: 163 mg/dL — ABNORMAL HIGH (ref 70–99)
Glucose-Capillary: 208 mg/dL — ABNORMAL HIGH (ref 70–99)

## 2021-09-14 LAB — TSH: TSH: 1.091 u[IU]/mL (ref 0.350–4.500)

## 2021-09-14 LAB — CORTISOL-AM, BLOOD: Cortisol - AM: 6.8 ug/dL (ref 6.7–22.6)

## 2021-09-14 LAB — PHOSPHORUS: Phosphorus: 3.8 mg/dL (ref 2.5–4.6)

## 2021-09-14 LAB — TROPONIN I (HIGH SENSITIVITY): Troponin I (High Sensitivity): 6 ng/L (ref <18)

## 2021-09-14 LAB — MAGNESIUM: Magnesium: 1.8 mg/dL (ref 1.7–2.4)

## 2021-09-14 LAB — LACTIC ACID, PLASMA: Lactic Acid, Venous: 1.2 mmol/L (ref 0.5–1.9)

## 2021-09-14 LAB — MRSA NEXT GEN BY PCR, NASAL: MRSA by PCR Next Gen: DETECTED — AB

## 2021-09-14 MED ORDER — INSULIN ASPART 100 UNIT/ML IJ SOLN
0.0000 [IU] | INTRAMUSCULAR | Status: DC
  Administered 2021-09-14: 2 [IU] via SUBCUTANEOUS
  Administered 2021-09-14: 3 [IU] via SUBCUTANEOUS
  Administered 2021-09-14 – 2021-09-15 (×4): 2 [IU] via SUBCUTANEOUS
  Administered 2021-09-15: 1 [IU] via SUBCUTANEOUS
  Administered 2021-09-15: 3 [IU] via SUBCUTANEOUS
  Administered 2021-09-15: 2 [IU] via SUBCUTANEOUS
  Administered 2021-09-15 – 2021-09-16 (×3): 1 [IU] via SUBCUTANEOUS
  Administered 2021-09-16 (×2): 2 [IU] via SUBCUTANEOUS
  Administered 2021-09-17 (×4): 1 [IU] via SUBCUTANEOUS

## 2021-09-14 MED ORDER — VANCOMYCIN HCL IN DEXTROSE 1-5 GM/200ML-% IV SOLN
1000.0000 mg | INTRAVENOUS | Status: DC
  Administered 2021-09-14: 1000 mg via INTRAVENOUS
  Filled 2021-09-14: qty 200

## 2021-09-14 MED ORDER — SODIUM CHLORIDE 0.9 % IV SOLN: INTRAVENOUS | Status: DC

## 2021-09-14 MED ORDER — PANTOPRAZOLE SODIUM 40 MG IV SOLR
40.0000 mg | Freq: Two times a day (BID) | INTRAVENOUS | Status: DC
  Administered 2021-09-14 – 2021-09-19 (×12): 40 mg via INTRAVENOUS
  Filled 2021-09-14 (×12): qty 10

## 2021-09-14 MED ORDER — ORAL CARE MOUTH RINSE
15.0000 mL | OROMUCOSAL | Status: DC
  Administered 2021-09-14 – 2021-09-15 (×14): 15 mL via OROMUCOSAL

## 2021-09-14 MED ORDER — FUROSEMIDE 10 MG/ML IJ SOLN
40.0000 mg | Freq: Two times a day (BID) | INTRAMUSCULAR | Status: DC
  Administered 2021-09-14 – 2021-09-16 (×5): 40 mg via INTRAVENOUS
  Filled 2021-09-14 (×5): qty 4

## 2021-09-14 MED ORDER — SODIUM CHLORIDE 0.9 % IV SOLN
100.0000 mg | Freq: Two times a day (BID) | INTRAVENOUS | Status: DC
  Administered 2021-09-14 – 2021-09-16 (×5): 100 mg via INTRAVENOUS
  Filled 2021-09-14 (×8): qty 100

## 2021-09-14 MED ORDER — ENOXAPARIN SODIUM 60 MG/0.6ML IJ SOSY
60.0000 mg | PREFILLED_SYRINGE | Freq: Two times a day (BID) | INTRAMUSCULAR | Status: DC
  Administered 2021-09-14 – 2021-09-17 (×8): 60 mg via SUBCUTANEOUS
  Filled 2021-09-14 (×8): qty 0.6

## 2021-09-14 MED ORDER — ORAL CARE MOUTH RINSE
15.0000 mL | OROMUCOSAL | Status: DC | PRN
Start: 2021-09-14 — End: 2021-09-19

## 2021-09-14 MED ORDER — MIDAZOLAM HCL 2 MG/2ML IJ SOLN
1.0000 mg | INTRAMUSCULAR | Status: DC | PRN
  Administered 2021-09-14 – 2021-09-15 (×5): 2 mg via INTRAVENOUS
  Administered 2021-09-15: 1 mg via INTRAVENOUS
  Administered 2021-09-15 (×2): 2 mg via INTRAVENOUS
  Filled 2021-09-14 (×8): qty 2

## 2021-09-14 MED ORDER — INSULIN ASPART 100 UNIT/ML IJ SOLN
0.0000 [IU] | Freq: Four times a day (QID) | INTRAMUSCULAR | Status: DC
  Administered 2021-09-14: 2 [IU] via SUBCUTANEOUS

## 2021-09-14 MED ORDER — CEFEPIME HCL 2 G IV SOLR
2.0000 g | Freq: Once | INTRAVENOUS | Status: AC
  Administered 2021-09-14: 2 g via INTRAVENOUS
  Filled 2021-09-14: qty 12.5

## 2021-09-14 MED ORDER — ACETAMINOPHEN 650 MG RE SUPP
650.0000 mg | Freq: Four times a day (QID) | RECTAL | Status: DC | PRN
  Administered 2021-09-14: 650 mg via RECTAL
  Filled 2021-09-14: qty 1

## 2021-09-14 MED ORDER — INSULIN ASPART 100 UNIT/ML IJ SOLN: 0.0000 [IU] | Freq: Every day | INTRAMUSCULAR | Status: DC

## 2021-09-14 MED ORDER — MUPIROCIN 2 % EX OINT
TOPICAL_OINTMENT | Freq: Two times a day (BID) | CUTANEOUS | Status: DC
  Administered 2021-09-14 – 2021-09-16 (×2): 1 via NASAL
  Filled 2021-09-14 (×2): qty 22

## 2021-09-14 MED ORDER — AMIODARONE HCL 200 MG PO TABS
200.0000 mg | ORAL_TABLET | Freq: Every day | ORAL | Status: DC
Start: 2021-09-14 — End: 2021-09-16
  Administered 2021-09-14 – 2021-09-15 (×2): 200 mg
  Filled 2021-09-14 (×2): qty 1

## 2021-09-14 MED ORDER — INSULIN DETEMIR 100 UNIT/ML ~~LOC~~ SOLN
10.0000 [IU] | Freq: Every day | SUBCUTANEOUS | Status: DC
Start: 2021-09-14 — End: 2021-09-19
  Administered 2021-09-15 – 2021-09-18 (×4): 10 [IU] via SUBCUTANEOUS
  Filled 2021-09-14 (×7): qty 0.1

## 2021-09-14 MED ORDER — SODIUM CHLORIDE 0.9 % IV SOLN
2.0000 g | Freq: Two times a day (BID) | INTRAVENOUS | Status: DC
  Administered 2021-09-14 – 2021-09-16 (×5): 2 g via INTRAVENOUS
  Filled 2021-09-14 (×5): qty 12.5

## 2021-09-14 MED ORDER — MIDODRINE HCL 5 MG PO TABS
10.0000 mg | ORAL_TABLET | Freq: Three times a day (TID) | ORAL | Status: DC
Start: 2021-09-14 — End: 2021-09-16
  Administered 2021-09-14 – 2021-09-15 (×6): 10 mg
  Filled 2021-09-14 (×7): qty 2

## 2021-09-14 MED ORDER — ACETAMINOPHEN 325 MG PO TABS
650.0000 mg | ORAL_TABLET | Freq: Four times a day (QID) | ORAL | Status: DC | PRN
  Administered 2021-09-17: 650 mg via ORAL

## 2021-09-14 MED ORDER — CHLORHEXIDINE GLUCONATE CLOTH 2 % EX PADS
6.0000 | MEDICATED_PAD | Freq: Every day | CUTANEOUS | Status: DC
  Administered 2021-09-14 – 2021-09-19 (×6): 6 via TOPICAL

## 2021-09-14 MED ORDER — MAGNESIUM SULFATE 2 GM/50ML IV SOLN
2.0000 g | Freq: Once | INTRAVENOUS | Status: AC
  Administered 2021-09-14: 2 g via INTRAVENOUS
  Filled 2021-09-14: qty 50

## 2021-09-14 NOTE — Assessment & Plan Note (Signed)
Continue  Risperdal, Remeron, Cymbalta--restarted Continue clonazepam--restarted

## 2021-09-14 NOTE — ED Provider Notes (Signed)
[facility] EMERGENCY DEPARTMENT Provider Note  CSN: [id number] Arrival date & time: 09/13/21 2059  Chief Complaint(s) Fever  HPI [name] is a 76 y.o. female with schizophrenia, recurrent DVT on Eliquis, A-fib on amiodarone, recent admission for pneumonia presenting to the emergency department with altered mental status.  Patient brought in by staff at facility who reported that she was found unresponsive difficulty breathing.  History otherwise limited due to critical illness, altered mental status.  Attempted to contact legal guardian although went to voicemail.   Past Medical History Past Medical History:  Diagnosis Date   Anxiety    Arthritis    Atrial fibrillation (HCC)    Atrial flutter ([place])    Collagen vascular disease ([place])    COVID-19 virus infection    COVID-19+ approx 01/24/19; asymptomatic course with full recovery   Dependence on wheelchair    pivot/transfers   Depression    History of psychosis and previous suicide attempt   DVT, lower extremity, recurrent ([place])    Long-term Coumadin per Dr. Legrand Rams   Essential hypertension    GERD (gastroesophageal reflux disease)    Hemiplegia ([place]) 2010   Left side   History of stroke    Acute infarct and right cerebral white matter small vessel disease 12/10   Leg DVT (deep venous thromboembolism), acute ([place]) 2006   Orthostatic hypotension    Schizophrenia ([place])    Stroke ([place])    left sided weakness   Type 2 diabetes mellitus ([place])    Patient Active Problem List   Diagnosis Date Noted   Septic shock ([place]) 09/13/2021   Pneumonia 08/14/2021   PNA (pneumonia) 08/13/2021   Opioid dependence ([place]) 07/24/2021   Prolonged QT interval 07/22/2021   Mood disorder ([place]) 05/18/2021   Fecal impaction ([place]) 04/23/2021   Acute respiratory failure with hypoxia ([place]) 03/22/2021   Paroxysmal atrial fibrillation ([place]) 03/22/2021   Hyperthyroidism    SBO (small bowel obstruction) ([place]) 04/18/2020   Atrial fibrillation with  RVR ([place]) 10/04/2019   GERD (gastroesophageal reflux disease) 08/26/2019   Constipation 09/09/2018   Hypotensive episode 03/21/2018   Primary osteoarthritis of right hip 04/18/2017   History of cerebrovascular accident (CVA) with residual deficit 03/28/2017   Schizophrenia ([place]) 03/28/2017   Generalized weakness 03/28/2017   IBS (irritable bowel syndrome) 10/16/2016   Left hemiparesis ([place]) 10/06/2016   Chronic superficial gastritis without bleeding    Stricture and stenosis of esophagus    IDA (iron deficiency anemia) 04/06/2015   Sedentary lifestyle 11/06/2014   Uncontrolled type 2 diabetes mellitus with hyperglycemia, with long-term current use of insulin ([place]) 03/24/2010   Essential hypertension 03/24/2010   DVT, lower extremity, recurrent ([place]) 03/24/2010   Home Medication(s) Prior to Admission medications   Medication Sig Start Date End Date Taking? Authorizing Provider  ABILIFY 2 MG tablet Take 1 tablet (2 mg total) by mouth daily. Patient taking differently: Take 2 mg by mouth at bedtime. 11/09/20   [name], MD  acetaminophen (TYLENOL) 325 MG tablet Take 650 mg by mouth 3 (three) times daily as needed for mild pain or moderate pain.     [provider]  albuterol (VENTOLIN HFA) 108 (90 Base) MCG/ACT inhaler Inhale 2 puffs into the lungs every 6 (six) hours as needed for wheezing or shortness of breath. 05/20/21   Barton Dubois, MD  amiodarone (PACERONE) 200 MG tablet Take 1 tablet (200 mg total) by mouth daily. 04/24/21   Barton Dubois, MD  apixaban (ELIQUIS) 5 MG TABS  tablet Take 1 tablet (5 mg total) by mouth 2 (two) times daily. 02/21/20   Richarda Osmond, MD  atorvastatin (LIPITOR) 40 MG tablet Take 1 tablet by mouth at bedtime. 05/30/20   [provider]  calcium carbonate (TUMS - DOSED IN MG ELEMENTAL CALCIUM) 500 MG chewable tablet Chew 1 tablet by mouth 4 (four) times daily as needed for heartburn.    [provider]  clonazePAM (KLONOPIN)  0.5 MG tablet Take 1 tablet (0.5 mg total) by mouth 3 (three) times daily as needed for anxiety. Patient taking differently: Take 0.5 mg by mouth daily. 05/20/21   Barton Dubois, MD  diltiazem (CARDIZEM CD) 120 MG 24 hr capsule Take 120 mg by mouth daily. 05/30/20   [provider]  EASYMAX TEST test strip USE TO CHECK BLOOD SUGAR FOUR TIMES DAILY.(HOLD IF BS<70: CALL MD IF BS> 400) 07/21/21   Reardon, Juanetta Beets, NP  GOODSENSE ARTIFICIAL TEARS 0.5-0.6 % SOLN Apply to eye. 06/03/21   [provider]  HYDROcodone-acetaminophen (NORCO) 10-325 MG tablet Take 1 tablet by mouth every 8 (eight) hours as needed for severe pain. Patient taking differently: Take 1 tablet by mouth in the morning, at noon, and at bedtime. 05/20/21   Barton Dubois, MD  insulin detemir (LEVEMIR FLEXTOUCH) 100 UNIT/ML FlexPen INJECT 22 UNITS SUBCUTANEOUSLY AT BEDTIME.(HOLD IF BS<70: CALL MD IF BS> 400) Patient taking differently: 22 Units at bedtime. 07/15/21   Brita Romp, NP  Lancets Thin MISC USE TO CHECK BLOOD SUGAR FOUR TIMES DAILY.(HOLD IF BS<70: CALL MD IF BS> 400) 09/13/21   Reardon, Juanetta Beets, NP  latanoprost (XALATAN) 0.005 % ophthalmic solution Place 1 drop into both eyes at bedtime.    [provider]  loratadine (CLARITIN) 10 MG tablet Take 10 mg by mouth daily.    [provider]  lubiprostone (AMITIZA) 8 MCG capsule Take 1 capsule by mouth daily. 08/10/21   [provider]  metoCLOPramide (REGLAN) 5 MG tablet Take 5 mg by mouth in the morning, at noon, and at bedtime. If zofran is not working    [provider]  metoprolol succinate (TOPROL-XL) 50 MG 24 hr tablet Take 1 tablet (50 mg total) by mouth daily. Take with or immediately following a meal. 04/01/21   Tat, Shanon Brow, MD  midodrine (PROAMATINE) 10 MG tablet Take 10 mg by mouth 3 (three) times daily.    [provider]  mirtazapine (REMERON) 30 MG tablet Take 30 mg by mouth at bedtime.    [provider]  naloxegol oxalate (MOVANTIK) 12.5 MG TABS tablet Take 1 tablet (12.5 mg total) by mouth daily. 04/24/21   Barton Dubois, MD  NOVOLOG FLEXPEN 100 UNIT/ML FlexPen INJECT SUBCUTANEOUSLY AS FOLLOWS WITH MEALS: 90-150=10u: 151-200=11u: 201-250=12u: 251-300=13u: 301-350=14u: 351-400=15u: BS>400=16u & CALL MD. Patient taking differently: 10-16 Units 3 (three) times daily with meals. INJECT SUBCUTANEOUSLY AS FOLLOWS WITH MEALS: 90-150=10u: 151-200=11u: 201-250=12u: 251-300=13u: 301-350=14u: 351-400=15u: BS>400=16u & CALL MD. 02/28/21   Brita Romp, NP  ondansetron (ZOFRAN) 4 MG tablet Take 4 mg by mouth 2 (two) times daily. 06/24/21   [provider]  oxybutynin (DITROPAN-XL) 5 MG 24 hr tablet Take 5 mg by mouth daily. 04/20/21   [provider]  pantoprazole (PROTONIX) 40 MG tablet Take 1 tablet (40 mg total) by mouth 2 (two) times daily before a meal. 11/17/20   Mahala Menghini, PA-C  polyethylene glycol (MIRALAX / GLYCOLAX) 17 g packet Take 17 g by mouth daily. 04/24/21  Barton Dubois, MD  risperiDONE (RISPERDAL) 1 MG tablet Take 1-2 mg by mouth 2 (two) times daily. Take 1 tablet by mouth in the morning. Take 2 tablets by mouth at bedtime. 04/04/21   [provider]  SANTYL 250 UNIT/GM ointment Apply 1 Application topically daily. 07/05/21   [provider]  senna (SENOKOT) 8.6 MG tablet Take 1 tablet by mouth daily.    [provider]  Skin Protectants, Misc. (BAZA PROTECT EX) Apply 1 Application topically in the morning and at bedtime. 08/02/21   [provider]  TRADJENTA 5 MG TABS tablet TAKE (1) TABLET BY MOUTH ONCE DAILY. Patient taking differently: Take 5 mg by mouth daily. 04/04/21   Brita Romp, NP                                                                                                                                    Past Surgical History Past Surgical History:  Procedure Laterality Date   BACK SURGERY      BIOPSY N/A 11/24/2014   Procedure: BIOPSY;  Surgeon: Danie Binder, MD;  Location: [facility];  Service: Endoscopy;  Laterality: N/A;   BIOPSY  05/17/2016   Procedure: BIOPSY;  Surgeon: Danie Binder, MD;  Location: [facility];  Service: Endoscopy;;  gastric biopsy   COLONOSCOPY WITH PROPOFOL N/A 11/24/2014   Dr. Rudie Meyer polyps removed/moderate sized internal hemorrhoids, tubular adenomas. Next surveillance in 3 years   ESOPHAGEAL DILATION N/A 07/23/2021   Procedure: ESOPHAGEAL DILATION;  Surgeon: Eloise Harman, DO;  Location: [facility];  Service: Endoscopy;  Laterality: N/A;   ESOPHAGOGASTRODUODENOSCOPY (EGD) WITH PROPOFOL N/A 11/24/2014   Dr. Clayburn Pert HH/patent stricture at the gastroesophageal junction, mild non-erosive gastritis, path negative for H.pylori or celiac sprue   ESOPHAGOGASTRODUODENOSCOPY (EGD) WITH PROPOFOL N/A 05/17/2016   Procedure: ESOPHAGOGASTRODUODENOSCOPY (EGD) WITH PROPOFOL;  Surgeon: Danie Binder, MD;  [facility];  Service: Endoscopy;  Laterality: N/A;  12:45pm   ESOPHAGOGASTRODUODENOSCOPY (EGD) WITH PROPOFOL N/A 07/23/2021   Procedure: ESOPHAGOGASTRODUODENOSCOPY (EGD) WITH PROPOFOL;  Surgeon: Eloise Harman, DO;  Location[facility];  Service: Endoscopy;  Laterality: N/A;   GIVENS CAPSULE STUDY N/A 12/11/2014   MULTILPLE EROSION IN the stomach WITH ACTIVE OOZING. OCCASIONAL EROSIONS AND RARE ULCER SEEN IN PROXIMAL SMALL BOWEL . No masses or AVMs SEEN. NO OLD BLOOD OR FRESH BLOOD SEEN.    POLYPECTOMY N/A 11/24/2014   Procedure: POLYPECTOMY;  Surgeon: Danie Binder, MD;  Location: [facility];  Service: Endoscopy;  Laterality: N/A;   SAVORY DILATION N/A 05/17/2016   Procedure: SAVORY DILATION;  Surgeon: Danie Binder, MD;  [facility];  Service: Endoscopy;  Laterality: N/A;   Family History Family History  Problem Relation Age of Onset   Hypertension Mother    Colon cancer Neg Hx     Social History Social History   Tobacco Use  Smoking status: Former    Packs/day: 0.25    Years: 20.00    Total pack years: 5.00    Types: Cigarettes    Quit date: 01/24/1995    Years since quitting: 26.6   Smokeless tobacco: Never  Vaping Use   Vaping Use: Never used  Substance Use Topics   Alcohol use: No    Alcohol/week: 0.0 standard drinks of alcohol   Drug use: No   Allergies Sulfa antibiotics  Review of Systems Review of Systems  Unable to perform ROS: Acuity of condition    Physical Exam Vital Signs  I have reviewed the triage vital signs BP (!) 114/56   Pulse 92   Temp (!) 100.6 F (38.1 C)   Resp 19   Ht _0  (1.702 m)   Wt 62.1 kg   SpO2 100%   BMI 21.46 kg/m  Physical Exam Constitutional:      General: She is in acute distress.     Appearance: She is ill-appearing.  HENT:     Head: Normocephalic and atraumatic.     Right Ear: External ear normal.     Left Ear: External ear normal.     Nose: No rhinorrhea.     Mouth/Throat:     Mouth: Mucous membranes are dry.  Eyes:     Pupils: Pupils are equal, round, and reactive to light.  Cardiovascular:     Rate and Rhythm: Regular rhythm. Tachycardia present.  Pulmonary:     Effort: Respiratory distress present.     Breath sounds: Rales (diffusely) present.  Abdominal:     General: Abdomen is flat.     Palpations: Abdomen is soft.     Tenderness: There is no abdominal tenderness.  Musculoskeletal:     Cervical back: Neck supple.     Right lower leg: No edema.     Left lower leg: No edema.  Skin:    General: Skin is warm and dry.     Capillary Refill: Capillary refill takes 2 to 3 seconds.  Neurological:     Mental Status: She is disoriented.     Comments: Response to sternal rub, pushes hand away, does not follow commands  Psychiatric:     Comments: Unable to assess     ED Results and Treatments Labs (all labs ordered are listed, but only abnormal results are displayed) Labs Reviewed  BLOOD GAS, VENOUS - Abnormal; Notable for the  following components:      Result Value   Bicarbonate 29.3 (*)    Acid-Base Excess 2.9 (*)    All other components within normal limits  COMPREHENSIVE METABOLIC PANEL - Abnormal; Notable for the following components:   Sodium 134 (*)    Glucose, Bld 158 (*)    Calcium 8.3 (*)    GFR, Estimated 59 (*)    All other components within normal limits  CBC WITH DIFFERENTIAL/PLATELET - Abnormal; Notable for the following components:   WBC 17.1 (*)    RBC 3.60 (*)    Hemoglobin 10.0 (*)    HCT 31.3 (*)    Neutro Abs 15.0 (*)    Abs Immature Granulocytes 0.09 (*)    All other components within normal limits  PROTIME-INR - Abnormal; Notable for the following components:   Prothrombin Time 18.8 (*)    INR 1.6 (*)    All other components within normal limits  BRAIN NATRIURETIC PEPTIDE - Abnormal; Notable for the following components:   B Natriuretic Peptide 229.0 (*)  All other components within normal limits  URINALYSIS, ROUTINE W REFLEX MICROSCOPIC - Abnormal; Notable for the following components:   APPearance CLOUDY (*)    Protein, ur 30 (*)    Nitrite POSITIVE (*)    Leukocytes,Ua LARGE (*)    WBC, UA >50 (*)    Bacteria, UA MANY (*)    All other components within normal limits  LACTIC ACID, PLASMA - Abnormal; Notable for the following components:   Lactic Acid, Venous 2.3 (*)    All other components within normal limits  BLOOD GAS, ARTERIAL - Abnormal; Notable for the following components:   pO2, Arterial 132 (*)    Acid-Base Excess 3.6 (*)    All other components within normal limits  RESP PANEL BY RT-PCR (FLU A&B, COVID) ARPGX2  CULTURE, BLOOD (ROUTINE X 2)  CULTURE, BLOOD (ROUTINE X 2)  MRSA NEXT GEN BY PCR, NASAL  LACTIC ACID, PLASMA  I-STAT CHEM 8, ED  TROPONIN I (HIGH SENSITIVITY)  TROPONIN I (HIGH SENSITIVITY)                                                                                                                          Radiology CT Head Wo  Contrast  Result Date: 09/13/2021 CLINICAL DATA:  Headache, new or worsening. EXAM: CT HEAD WITHOUT CONTRAST TECHNIQUE: Contiguous axial images were obtained from the base of the skull through the vertex without intravenous contrast. RADIATION DOSE REDUCTION: This exam was performed according to the departmental dose-optimization program which includes automated exposure control, adjustment of the mA and/or kV according to patient size and/or use of iterative reconstruction technique. COMPARISON:  CT examination dated August 04, 2021 FINDINGS: Brain: No evidence of acute infarction, hemorrhage, hydrocephalus, extra-axial collection or mass lesion/mass effect. Low-attenuation of the periventricular and subcortical white matter presumed advanced chronic microvascular ischemic changes. Vascular: No hyperdense vessel or unexpected calcification. Skull: Normal. Negative for fracture or focal lesion. Sinuses/Orbits: Paranasal sinus postsurgical changes. Other: None. IMPRESSION: 1. No acute intracranial abnormality. 2. Advanced chronic microvascular ischemic changes of the supratentorial white matter. Electronically Signed   By: Keane Police D.O.   On: 09/13/2021 23:38   DG Chest Port 1 View  Result Date: 09/13/2021 CLINICAL DATA:  Hypoxia EXAM: PORTABLE CHEST 1 VIEW COMPARISON:  08/29/2021 FINDINGS: Cardiomegaly. Interstitial prominence throughout the lungs, right greater than left. This could reflect interstitial edema or atypical infection. NG tube is in the stomach. Endotracheal tube is 3 cm above the carina. No effusions or acute bony abnormality. IMPRESSION: Cardiomegaly. Increasing interstitial prominence, right greater than left, favor interstitial edema although atypical infection cannot be excluded. Electronically Signed   By: Rolm Baptise M.D.   On: 09/13/2021 22:11    Pertinent labs & imaging results that were available during my care of the patient were reviewed by me and considered in my medical  decision making (see MDM for details).  Medications Ordered in ED Medications  vancomycin (VANCOREADY) IVPB 1250 mg/250 mL (1,250 mg  Intravenous New Bag/Given 09/13/21 2301)  0.9 %  sodium chloride infusion (250 mLs Intravenous New Bag/Given 09/13/21 2252)  norepinephrine (LEVOPHED) 12m in 2556m(0.016 mg/mL) premix infusion (4 mcg/min Intravenous Infusion Verify 09/13/21 2302)  midazolam (VERSED) 100 mg/100 mL (1 mg/mL) premix infusion (1 mg/hr Intravenous Infusion Verify 09/13/21 2336)  fentaNYL 250046min NS 250m25m0mc101m) infusion-PREMIX (50 mcg/hr Intravenous Infusion Verify 09/13/21 2336)  acetaminophen (TYLENOL) suppository 650 mg (650 mg Rectal Given 09/13/21 2201)  lactated ringers bolus 1,000 mL (0 mLs Intravenous Stopped 09/13/21 2205)  piperacillin-tazobactam (ZOSYN) IVPB 3.375 g (0 g Intravenous Stopped 09/13/21 2240)  etomidate (AMIDATE) injection 20 mg (20 mg Intravenous Given 09/13/21 2137)  rocuronium (ZEMURON) injection 80 mg (80 mg Intravenous Given 09/13/21 2137)  lactated ringers bolus 1,000 mL (0 mLs Intravenous Stopped 09/13/21 2249)                                                                                                                                     Procedures .Critical Care  Performed by: SchevCristie HemAuthorized by: SchevCristie Hem  Critical care provider statement:    Critical care time (minutes):  30   Critical care time was exclusive of:  Separately billable procedures and treating other patients   Critical care was necessary to treat or prevent imminent or life-threatening deterioration of the following conditions:  Cardiac failure, circulatory failure, dehydration, sepsis and respiratory failure   Critical care was time spent personally by me on the following activities:  Development of treatment plan with patient or surrogate, discussions with consultants, evaluation of patient's response to treatment, examination of patient, ordering  and review of laboratory studies, ordering and review of radiographic studies, ordering and performing treatments and interventions, pulse oximetry, re-evaluation of patient's condition and review of old charts   Care discussed with: admitting provider   Procedure Name: Intubation Date/Time: 09/14/2021 12:20 AM  Performed by: SchevCristie Hemre-anesthesia Checklist: Patient identified, Patient being monitored, Emergency Drugs available, Timeout performed and Suction available Oxygen Delivery Method: Nasal cannula Preoxygenation: Pre-oxygenation with 100% oxygen Induction Type: Rapid sequence Ventilation: Mask ventilation without difficulty Laryngoscope Size: 4 and Mac Grade View: Grade IV Tube size: 7.0 mm Number of attempts: 1 Airway Equipment and Method: Stylet and Video-laryngoscopy Placement Confirmation: ETT inserted through vocal cords under direct vision, CO2 detector and Breath sounds checked- equal and bilateral Secured at: 22 cm Tube secured with: ETT holder Dental Injury: Teeth and Oropharynx as per pre-operative assessment     .1-3 Lead EKG Interpretation  Performed by: SchevCristie HemAuthorized by: SchevCristie Hem    Interpretation: abnormal     ECG rate assessment: tachycardic     Rhythm: sinus tachycardia     Ectopy: none     Conduction: normal     (including critical care time)  Medical Decision Making / ED Course  MDM:  76 year old female presenting to the emergency department after being found with altered mental status.  In the emergency department, the patient was hypotensive, febrile, hypoxic and tachycardic.  Patient was initially started on nasal cannula and given IV fluids.  Her oxygen saturation on high flow nasal cannula remained in the mid 80s.  Given her poor mental status, BiPAP was deferred and the patient was intubated.  Attempted to contact patient's legal guardian although phone went straight to voicemail.  Imaging  significant for possible pneumonia on chest x-ray.  CT head negative for acute intracranial process.  Urinalysis also concerning for possible urinary infection.  Abdomen not distended, no appreciable tenderness, lower concern for intra-abdominal process.  Patient initially started on propofol became slightly hypotensive so started on Versed and fentanyl instead of propofol, started on low-dose peripheral pressors.  Discussed with ICU physician who recommends admission to [place] with the hospitalist agrees with admission.      Additional history obtained: -Additional history obtained from staff at facility -External records from outside source obtained and reviewed including: Chart review including previous notes, labs, imaging, consultation notes   Lab Tests: -I ordered, reviewed, and interpreted labs.   The pertinent results include:   Labs Reviewed  BLOOD GAS, VENOUS - Abnormal; Notable for the following components:      Result Value   Bicarbonate 29.3 (*)    Acid-Base Excess 2.9 (*)    All other components within normal limits  COMPREHENSIVE METABOLIC PANEL - Abnormal; Notable for the following components:   Sodium 134 (*)    Glucose, Bld 158 (*)    Calcium 8.3 (*)    GFR, Estimated 59 (*)    All other components within normal limits  CBC WITH DIFFERENTIAL/PLATELET - Abnormal; Notable for the following components:   WBC 17.1 (*)    RBC 3.60 (*)    Hemoglobin 10.0 (*)    HCT 31.3 (*)    Neutro Abs 15.0 (*)    Abs Immature Granulocytes 0.09 (*)    All other components within normal limits  PROTIME-INR - Abnormal; Notable for the following components:   Prothrombin Time 18.8 (*)    INR 1.6 (*)    All other components within normal limits  BRAIN NATRIURETIC PEPTIDE - Abnormal; Notable for the following components:   B Natriuretic Peptide 229.0 (*)    All other components within normal limits  URINALYSIS, ROUTINE W REFLEX MICROSCOPIC - Abnormal; Notable for  the following components:   APPearance CLOUDY (*)    Protein, ur 30 (*)    Nitrite POSITIVE (*)    Leukocytes,Ua LARGE (*)    WBC, UA >50 (*)    Bacteria, UA MANY (*)    All other components within normal limits  LACTIC ACID, PLASMA - Abnormal; Notable for the following components:   Lactic Acid, Venous 2.3 (*)    All other components within normal limits  BLOOD GAS, ARTERIAL - Abnormal; Notable for the following components:   pO2, Arterial 132 (*)    Acid-Base Excess 3.6 (*)    All other components within normal limits  RESP PANEL BY RT-PCR (FLU A&B, COVID) ARPGX2  CULTURE, BLOOD (ROUTINE X 2)  CULTURE, BLOOD (ROUTINE X 2)  MRSA NEXT GEN BY PCR, NASAL  LACTIC ACID, PLASMA  I-STAT CHEM 8, ED  TROPONIN I (HIGH SENSITIVITY)  TROPONIN I (HIGH SENSITIVITY)      EKG   EKG Interpretation  Date/Time:  Tuesday September 13 2021  21:15:36 EDT Ventricular Rate:  101 PR Interval:    QRS Duration: 94 QT Interval:  445 QTC Calculation: 577 R Axis:   -19 Text Interpretation: Atrial fibrillation Ventricular premature complex Borderline left axis deviation Consider anterior infarct Borderline repolarization abnormality Confirmed by Garnette Gunner [phone]) on 09/13/2021 9:48:19 PM         Imaging Studies ordered: I ordered imaging studies including CT head, CXR I independently visualized and interpreted imaging. I agree with the radiologist interpretation   Medicines ordered and prescription drug management: Meds ordered this encounter  Medications   DISCONTD: sodium chloride 0.9 % bolus 1,893 mL   acetaminophen (TYLENOL) suppository 650 mg   rocuronium bromide 100 MG/10ML SOSY    Thompson, Reece C: cabinet override   etomidate (AMIDATE) 2 MG/ML injection    Celedonio Savage C: cabinet override   DISCONTD: propofol (DIPRIVAN) 1000 MG/100ML infusion   lactated ringers bolus 1,000 mL   propofol (DIPRIVAN) 1000 MG/100ML infusion    Celedonio Savage C: cabinet override   DISCONTD:  propofol (DIPRIVAN) 1000 MG/100ML infusion   piperacillin-tazobactam (ZOSYN) IVPB 3.375 g    Order Specific Question:   Antibiotic Indication:    Answer:   Aspiration Pneumonia   DISCONTD: propofol (DIPRIVAN) 1000 MG/100ML infusion   vancomycin (VANCOREADY) IVPB 1250 mg/250 mL    Order Specific Question:   Indication:    Answer:   Pneumonia   etomidate (AMIDATE) injection 20 mg   rocuronium (ZEMURON) injection 80 mg   lactated ringers bolus 1,000 mL   0.9 %  sodium chloride infusion   norepinephrine (LEVOPHED) 53m in 2518m(0.016 mg/mL) premix infusion    Order Specific Question:   IV Access    Answer:   Peripheral   DISCONTD: midazolam (VERSED) 100 mg/100 mL (1 mg/mL) premix infusion   DISCONTD: fentaNYL 250079min NS 250m48m0mc43m) infusion-PREMIX   DISCONTD: fentaNYL 2500mcg69mNS 250mL (61mg/m87mnfusion-PREMIX   midazolam (VERSED) 100 mg/100 mL (1 mg/mL) premix infusion   norepinephrine (LEVOPHED) 4-5 MG/250ML-% infusion SOLN    Thompson, Reece C: cabinet override   fentaNYL 2500mcg in56m250mL (8m19ml) 76msion-PREMIX    -I have reviewed the patients home medicines and have made adjustments as needed    Consultations Obtained: I requested consultation with the intensivist,  and discussed lab and imaging findings as well as pertinent plan - they recommend: admission to [facility]   Cardiac Monitoring: The patient was maintained on a cardiac monitor.  I personally viewed and interpreted the cardiac monitored which showed an underlying rhythm of: sinus tachycardia  Social Determinants of Health:  Factors impacting patients care include: schizophrenia   Reevaluation: After the interventions noted above, I reevaluated the patient and found that they have :improved  Co morbidities that complicate the patient evaluation  Past Medical History:  Diagnosis Date   Anxiety    Arthritis    Atrial fibrillation (HCC)    [place] flutter (HCC)    Collagen vascular disease  (HCC)    COVMorris19 virus infection    COVID-19+ approx 01/24/19; asymptomatic course with full recovery   Dependence on wheelchair    pivot/transfers   Depression    History of psychosis and previous suicide attempt   DVT, lower extremity, recurrent (HCC)    [place] Coumadin per Dr. [name]   Ess[place]grand [place]l hypertension    GERD (gastroesophageal reflux disease)    Hemiplegia (HCC) 2010  [place] side   History of stroke    Acute infarct and right cerebral  white matter small vessel disease 12/10   Leg DVT (deep venous thromboembolism), acute ([place]) 2006   Orthostatic hypotension    Schizophrenia (HCC)    Stroke (HCC)    left sided weakness   Type 2 diabetes mellitus ([place])       Dispostion: Admit     Final Clinical Impression(s) / ED Diagnoses Final diagnoses:  Sepsis, due to unspecified organism, unspecified whether acute organ dysfunction present ([place])  Altered mental status, unspecified altered mental status type  Bacterial urinary infection  Hypotension, unspecified hypotension type  Lactic acidosis     This chart was dictated using voice recognition software.  Despite best efforts to proofread,  errors can occur which can change the documentation meaning.    Cristie Hem, MD 09/14/21 [phone]

## 2021-09-14 NOTE — Hospital Course (Addendum)
76 year old female with a history of schizophrenia, hypertension, diabetes mellitus type 2, DVT, anxiety, and atrial fibrillation presenting with altered mental status and hypoxia.  The patient normally resides at [facility]. Unfortunately patient is not able to provide any history at all as she is on the vent and sedated.  She was brought in by facility staff that reported she was less responsive and having difficulty breathing.  Also noted her to have a fever.  In the ED patient was not protecting her airway and was quite hypoxic despite nasal cannula.  She was intubated.  Work-up revealed a UTI and interstitial edema versus atypical infection on chest x-ray.  CT head did not show any abnormality.  Patient had 2 L bolus, and continued to be hypotensive so she was started on Levophed.  Admission requested for septic shock. PCCM consulted to assist with management. Echo showed EF 65-70% with HOCM.  Cardiology was consulted to assist with management.

## 2021-09-14 NOTE — Progress Notes (Signed)
Lab called to report MRSA detected in swab. Courtney Corona Edd Fabian

## 2021-09-14 NOTE — Assessment & Plan Note (Signed)
Continue Protonix °

## 2021-09-14 NOTE — Assessment & Plan Note (Addendum)
Continue lovenox for [facility] with plans to switch back to apixaban Continue amiodarone Holding metoprolol succinate and diltiazem CD due to hypotension initially Having some RVR as expected Restart metoprolol succinate as BP now improving

## 2021-09-14 NOTE — Assessment & Plan Note (Addendum)
On [place] lovenox currently>>plan to transition back to apixaban

## 2021-09-14 NOTE — Assessment & Plan Note (Addendum)
-   Fever 100.6, tachycardia 116, respiratory rate 25, blood pressure 70/43, acute respiratory failure with an oxygen sat of 78%, leukocytosis 17, lactic acidosis 2.3 - Secondary to UTI  - Continue IV antibiotics - Patient is requiring 7 mcg of Levophed>>weaned off 8/24 afternoon - This is after receiving her 30 mL/kg bolus - initially started cefepime and doxycycline>>ceftriaxone Sepsis physiology now resolved

## 2021-09-14 NOTE — Assessment & Plan Note (Addendum)
Continue Levemir 10 units hx Continue NovoLog sliding scale 03/22/2021 hemoglobin A1c 7.1 08/19/21 A1C--6.2 CBGs largely controlled during the hospitalization

## 2021-09-14 NOTE — Assessment & Plan Note (Addendum)
-   Secondary to pulmonary edema - Chest x-ray shows interstitial pulmonary edema versus atypical infection- patient was discharged July 23 after admission from pneumonia, those infiltrates should be resolved by now - trach aspirate culture S.pneumoniae -appreciate PCCM--discussed with Dr. Elsworth Soho --extubated 09/15/21 afternoon --MRSA screen is positive --now remains stable on RA

## 2021-09-14 NOTE — Assessment & Plan Note (Addendum)
Holding dilitazem and metoprolol succinate due to hypotension initially --restart metoprolol succinate now that BPs improving

## 2021-09-14 NOTE — TOC Progression Note (Signed)
  Transition of Care [facility]) Screening Note   Patient Details  Name: [name] Date of Birth: [birth year]   Transition of Care [facility]) CM/SW Contact:    Boneta Lucks, RN Phone Number: 09/14/2021, 4:24 PM  From [facility], TOC to follow for DC needs  Transition of Care [facility]) has reviewed patient and no TOC needs have been identified at this time. We will continue to monitor patient advancement through interdisciplinary progression rounds. If new patient transition needs arise, please place a TOC consult.     Expected Discharge Plan: Assisted Living Barriers to Discharge: Continued Medical Work up  Expected Discharge Plan and Services Expected Discharge Plan: Assisted Living

## 2021-09-14 NOTE — Progress Notes (Addendum)
PHARMACY ANTIBIOTIC/ANTICOAGULATION CONSULT NOTE   ID: 76 y.o. female admitted on 09/13/21 with fever, SOB, weakness. Pharmacy consulted for Cefepime x 7 days for UTI/urosepsis.  AC: Pt on apixaban '5mg'$  PTA for recurrent DVT and afib. - last dose 8/22 am. To start Lovenox. Hgb 10 on admission, plt wnl.  Plan: Cefepime 2gm IV q12h x 7 days Will f/u renal function, micro data, and pt's clinical condition Lovenox 60 mg SQ q12h Will f/u CBC q72h while on Lovenox  ADDENDUM (0230): Now adding Vancomycin back for PNA. Vancomycin `1000 mg IV Q 24 hrs. Goal AUC 400-550. Expected AUC: 515, SCr used: 0.99 Will f/u renal function, micro data, and pt's clinical condition Vanc levels prn F/u MRSA PCR    Allergies:  Allergies  Allergen Reactions   Sulfa Antibiotics Rash    Filed Weights   09/13/21 2112 09/13/21 2151  Weight: 63 kg (139 lb) 62.1 kg (137 lb)       Latest Ref Rng & Units 09/13/2021    9:21 PM 08/14/2021    5:21 AM 08/13/2021   10:59 AM  CBC  WBC 4.0 - 10.5 K/uL 17.1  8.2  12.3   Hemoglobin 12.0 - 15.0 g/dL 10.0  9.5  10.7   Hematocrit 36.0 - 46.0 % 31.3  30.1  33.6   Platelets 150 - 400 K/uL 273  261  295     Antibiotics Given (last 72 hours)     Date/Time Action Medication Dose Rate   09/13/21 2206 New Bag/Given   piperacillin-tazobactam (ZOSYN) IVPB 3.375 g 3.375 g 100 mL/hr   09/13/21 2301 New Bag/Given   vancomycin (VANCOREADY) IVPB 1250 mg/250 mL 1,250 mg 166.7 mL/hr       Antimicrobials this admission: Zosyn 8/22 x1 Vancomycin 8/22 >>  Cefepime 8/23 >>8/29  Microbiology results: 8/22 Bcx: sent 8/22 MRSA PCR: sent   Thank you for allowing pharmacy to be a part of this patient's care.  Sherlon Handing, PharmD, BCPS Please see amion for complete clinical pharmacist phone list 09/14/2021 12:18 AM

## 2021-09-14 NOTE — Assessment & Plan Note (Addendum)
-   obtunded in ED initially - CT head shows no acute intracranial abnormality - Patient was hypotensive which is likely contributing at 70/43, but also hypoxic as she was not protecting her airway at 78% - intubated in ED to protect airway - Initial pH 7.3, PCO2 33 -  altered mental status secondary to sepsis, hypoxia and hypotension - overall improved, back to baseline

## 2021-09-14 NOTE — Progress Notes (Signed)
PROGRESS NOTE  [name] WOE:[id number] DOB: Nov 10, 1945 DOA: 09/13/2021 PCP: Carrolyn Meiers, MD  Brief History:  76 year old female with a history of schizophrenia, hypertension, diabetes mellitus type 2, DVT, anxiety, and atrial fibrillation presenting with altered mental status and hypoxia.  The patient normally resides at [facility]. Unfortunately patient is not able to provide any history at all as she is on the vent and sedated.  She was brought in by facility staff that reported she was less responsive and having difficulty breathing.  Also noted her to have a fever.  In the ED patient was not protecting her airway and was quite hypoxic despite nasal cannula.  She was intubated.  Work-up revealed a UTI and interstitial edema versus atypical infection on chest x-ray.  CT head did not show any abnormality.  Patient had 2 L bolus, and continued to be hypotensive so she was started on Levophed.  Admission requested for septic shock. PCCM consulted to assist with management.     Assessment and Plan: * Septic shock (HCC) - Fever 100.6, tachycardia 116, respiratory rate 25, blood pressure 70/43, acute respiratory failure with an oxygen sat of 78%, leukocytosis 17, lactic acidosis 2.3 - Secondary to UTI  - Continue IV antibiotics - Patient is requiring 7 mcg of Levophed at this time - This is after receiving her 30 mL/kg bolus - Continue gentle hydration given possible interstitial pulmonary edema - Continue cefepime and doxycycline   Schizophrenia (HCC) Continue  Risperdal, Remeron, Cymbalta once extubated Continue prn clonazepam  Uncontrolled type 2 diabetes mellitus with hyperglycemia, with long-term current use of insulin (HCC) Continue Levemir Continue NovoLog sliding scale 03/22/2021 hemoglobin A1c 7.1  GERD (gastroesophageal reflux disease) - Continue Protonix  Acute respiratory failure with hypoxia (HCC) - Secondary to pulmonary edema - Chest  x-ray shows interstitial pulmonary edema versus atypical infection- patient was discharged July 23 after admission from pneumonia, those infiltrates should be resolved by now - trach aspirate culture pending -appreciate PCCM--discussed with Dr. Elsworth Soho --wean to extubation  Acute diastolic CHF (congestive heart failure) ([place]) Start IV lasix Remains clinically fluid overloaded 10/15/19 Echo EF 60-65%, no WMA, mild MR Repeat Echo Accurate I/O  UTI (urinary tract infection) - Cefepime-continue -UA >50 WBC - Urine culture pending - Blood culture pending  DVT, lower extremity, recurrent (HCC) On [place] lovenox currently>>plan to transition back to apixaban ultimately  Stricture and stenosis of esophagus 07/23/2021 EGD--showed food in the middle third and lower third of the esophagus.  The patient was subsequently debated during the procedure secondary to aspiration risk.  90% of the retained food was removed with 10% pushed into the stomach.  A distal esophageal stricture was noted. -Continue full liquid diet once extubated -Continues to have dysphagia -7/3 barium swallow>>Nonspecific esophageal dysmotility disorder, with termination of primary peristaltic waves in the mid esophagus, and tertiary contractions in the esophagus Continue IV pantoprazole twice daily  Opioid dependence ([place]) PDMP reviewed Norco 10/325, #90, last refilled on 07/07/2021, 06/09/2018 Clonazepam 0.5 mg, #90, last filled on 07/07/2021, 06/13/2021.  Essential hypertension Holding dilitazem and metoprolol succinate due to hypotension  Acute metabolic encephalopathy - Described as less responsive - CT head shows no acute intracranial abnormality - Patient was hypotensive which is likely contributing at 70/43, but also hypoxic as she was not protecting her airway at 78% - intubated in ED to protect airway - Initial pH 7.3, PCO2 33 -  altered mental status secondary  to sepsis, hypoxia and hypotension   Paroxysmal atrial  fibrillation (HCC) Continue lovenox for [facility] with plans to switch back to apixaban Continue amiodarone Holding metoprolol succinate and diltiazem CD due to hypotension Rate controlled presently       Family Communication:   daughter updated at bedside 8/23  Consultants:  PCCM  Code Status:  FULL   DVT Prophylaxis:  [place] Lovenox   Procedures: As Listed in Progress Note Above  Antibiotics: Cefepime 8/22>> Doxy 8/22>>      Subjective: Intubated and sedated.  ROS not possible  Objective: Vitals:   09/14/21 0745 09/14/21 0800 09/14/21 0815 09/14/21 0830  BP: (!) 122/50 (!) 108/43 (!) 121/51 (!) 119/33  Pulse: 85 (!) 105 (!) 154 77  Resp: '18 17 19 18  '$ Temp: 99.7 F (37.6 C)     TempSrc: Axillary     SpO2: 100% 100% 100% 100%  Weight:      Height:        Intake/Output Summary (Last 24 hours) at 09/14/2021 0847 Last data filed at 09/14/2021 0175 Gross per 24 hour  Intake 3653.92 ml  Output 1200 ml  Net 2453.92 ml   Weight change:  Exam:  General:  Pt is alert, follows commands appropriately, not in acute distress HEENT: No icterus, No thrush, No neck mass, [place]/AT Cardiovascular: IRRR, S1/S2, no rubs, no gallops Respiratory: bilateral rales. No wheeze Abdomen: Soft/+BS, non tender, non distended, no guarding Extremities: 1 + LE edema, No lymphangitis, No petechiae, No rashes, no synovitis   Data Reviewed: I have personally reviewed following labs and imaging studies Basic Metabolic Panel: Recent Labs  Lab 09/13/21 2121 09/14/21 0310  NA 134* 135  K 4.1 4.0  CL 98 103  CO2 25 25  GLUCOSE 158* 196*  BUN 17 15  CREATININE 0.99 0.79  CALCIUM 8.3* 7.7*  MG  --  1.8  PHOS  --  3.8   Liver Function Tests: Recent Labs  Lab 09/13/21 2121 09/14/21 0310  AST 23 19  ALT 19 16  ALKPHOS 84 64  BILITOT 0.6 1.0  PROT 7.5 6.0*  ALBUMIN 3.5 2.8*   No results for input(s): "LIPASE", "AMYLASE" in the last 168 hours. No results for input(s): "AMMONIA" in  the last 168 hours. Coagulation Profile: Recent Labs  Lab 09/13/21 2121 09/14/21 0310  INR 1.6* 1.6*   CBC: Recent Labs  Lab 09/13/21 2121 09/14/21 0310  WBC 17.1* 15.7*  NEUTROABS 15.0* 13.6*  HGB 10.0* 9.4*  HCT 31.3* 29.5*  MCV 86.9 87.0  PLT 273 216   Cardiac Enzymes: No results for input(s): "CKTOTAL", "CKMB", "CKMBINDEX", "TROPONINI" in the last 168 hours. BNP: Invalid input(s): "POCBNP" CBG: Recent Labs  Lab 09/14/21 0215 09/14/21 0728  GLUCAP 181* 195*   HbA1C: No results for input(s): "HGBA1C" in the last 72 hours. Urine analysis:    Component Value Date/Time   COLORURINE YELLOW 09/13/2021 2212   APPEARANCEUR CLOUDY (A) 09/13/2021 2212   APPEARANCEUR Cloudy (A) 05/01/2016 1525   LABSPEC 1.017 09/13/2021 2212   PHURINE 6.0 09/13/2021 2212   GLUCOSEU NEGATIVE 09/13/2021 2212   HGBUR NEGATIVE 09/13/2021 2212   BILIRUBINUR NEGATIVE 09/13/2021 2212   BILIRUBINUR Negative 05/01/2016 1525   KETONESUR NEGATIVE 09/13/2021 2212   PROTEINUR 30 (A) 09/13/2021 2212   UROBILINOGEN 0.2 09/14/2014 1703   NITRITE POSITIVE (A) 09/13/2021 2212   LEUKOCYTESUR LARGE (A) 09/13/2021 2212   Sepsis Labs: '@LABRCNTIP'$ (procalcitonin:4,lacticidven:4) ) Recent Results (from the past 240 hour(s))  Resp Panel by RT-PCR (  Flu A&B, Covid) Anterior Nasal Swab     Status: None   Collection Time: 09/13/21  9:20 PM   Specimen: Anterior Nasal Swab  Result Value Ref Range Status   SARS Coronavirus 2 by RT PCR NEGATIVE NEGATIVE Final    Comment: (NOTE) SARS-CoV-2 target nucleic acids are NOT DETECTED.  The SARS-CoV-2 RNA is generally detectable in upper respiratory specimens during the acute phase of infection. The lowest concentration of SARS-CoV-2 viral copies this assay can detect is 138 copies/mL. A negative result does not preclude SARS-Cov-2 infection and should not be used as the sole basis for treatment or other patient management decisions. A negative result may occur with   improper specimen collection/handling, submission of specimen other than nasopharyngeal swab, presence of viral mutation(s) within the areas targeted by this assay, and inadequate number of viral copies(<138 copies/mL). A negative result must be combined with clinical observations, patient history, and epidemiological information. The expected result is Negative.  Fact Sheet for Patients:  EntrepreneurPulse.com.au  Fact Sheet for Healthcare Providers:  IncredibleEmployment.be  This test is no t yet approved or cleared by the [place] FDA and  has been authorized for detection and/or diagnosis of SARS-CoV-2 by FDA under an Emergency Use Authorization (EUA). This EUA will remain  in effect (meaning this test can be used) for the duration of the COVID-19 declaration under Section 564(b)(1) of the Act, 21 U.S.C.section 360bbb-3(b)(1), unless the authorization is terminated  or revoked sooner.       Influenza A by PCR NEGATIVE NEGATIVE Final   Influenza B by PCR NEGATIVE NEGATIVE Final    Comment: (NOTE) The Xpert Xpress SARS-CoV-2/FLU/RSV plus assay is intended as an aid in the diagnosis of influenza from Nasopharyngeal swab specimens and should not be used as a sole basis for treatment. Nasal washings and aspirates are unacceptable for Xpert Xpress SARS-CoV-2/FLU/RSV testing.  Fact Sheet for Patients: EntrepreneurPulse.com.au  Fact Sheet for Healthcare Providers: IncredibleEmployment.be  This test is not yet approved or cleared by the [place] FDA and has been authorized for detection and/or diagnosis of SARS-CoV-2 by FDA under an Emergency Use Authorization (EUA). This EUA will remain in effect (meaning this test can be used) for the duration of the COVID-19 declaration under Section 564(b)(1) of the Act, 21 U.S.C. section 360bbb-3(b)(1), unless the authorization is terminated  or revoked.  Performed at [facility], [address] [place], [place] [address]   Culture, blood (routine x 2)     Status: None (Preliminary result)   Collection Time: 09/13/21  9:20 PM   Specimen: BLOOD RIGHT HAND  Result Value Ref Range Status   Specimen Description BLOOD RIGHT HAND  Final   Special Requests   Final    BOTTLES DRAWN AEROBIC AND ANAEROBIC Blood Culture results may not be optimal due to an inadequate volume of blood received in culture bottles   Culture   Final    NO GROWTH < 12 HOURS Performed at [facility], [address] [place], [place] [address]    Report Status PENDING  Incomplete  Culture, blood (routine x 2)     Status: None (Preliminary result)   Collection Time: 09/13/21 11:01 PM   Specimen: BLOOD  Result Value Ref Range Status   Specimen Description BLOOD  Final   Special Requests NONE  Final   Culture   Final    NO GROWTH < 12 HOURS Performed at [facility], [address] [place], [place] [address]    Report Status  PENDING  Incomplete  MRSA Next Gen by PCR, Nasal     Status: Abnormal   Collection Time: 09/14/21  1:50 AM   Specimen: Nasal Mucosa; Nasal Swab  Result Value Ref Range Status   MRSA by PCR Next Gen DETECTED (A) NOT DETECTED Final    Comment: RESULT CALLED TO, READ BACK BY AND VERIFIED WITH: SUPPER,B'@0507'$  BY MATTHEWS, B 8.23.23 (NOTE) The GeneXpert MRSA Assay (FDA approved for NASAL specimens only), is one component of a comprehensive MRSA colonization surveillance program. It is not intended to diagnose MRSA infection nor to guide or monitor treatment for MRSA infections. Test performance is not FDA approved in patients less than 5 years old. Performed at [facility], [address], [place], [place] [address]      Scheduled Meds:  amiodarone  200 mg Per Tube Daily   Chlorhexidine Gluconate Cloth  6 each Topical Q0600   enoxaparin (LOVENOX) injection  60 mg Subcutaneous Q12H   insulin aspart  0-5 Units Subcutaneous QHS    insulin aspart  0-9 Units Subcutaneous Q6H   insulin detemir  10 Units Subcutaneous QHS   midodrine  10 mg Per Tube TID   mupirocin ointment   Nasal BID   mouth rinse  15 mL Mouth Rinse Q2H   pantoprazole (PROTONIX) IV  40 mg Intravenous Q12H   Continuous Infusions:  ceFEPime (MAXIPIME) IV Stopped (09/14/21 6314)   doxycycline (VIBRAMYCIN) IV Stopped (09/14/21 0603)   fentaNYL infusion INTRAVENOUS 75 mcg/hr (09/14/21 0837)   midazolam 1 mg/hr (09/14/21 0837)   norepinephrine (LEVOPHED) Adult infusion 8 mcg/min (09/14/21 0842)   vancomycin      Procedures/Studies: CT Head Wo Contrast  Result Date: 09/13/2021 CLINICAL DATA:  Headache, new or worsening. EXAM: CT HEAD WITHOUT CONTRAST TECHNIQUE: Contiguous axial images were obtained from the base of the skull through the vertex without intravenous contrast. RADIATION DOSE REDUCTION: This exam was performed according to the departmental dose-optimization program which includes automated exposure control, adjustment of the mA and/or kV according to patient size and/or use of iterative reconstruction technique. COMPARISON:  CT examination dated August 04, 2021 FINDINGS: Brain: No evidence of acute infarction, hemorrhage, hydrocephalus, extra-axial collection or mass lesion/mass effect. Low-attenuation of the periventricular and subcortical white matter presumed advanced chronic microvascular ischemic changes. Vascular: No hyperdense vessel or unexpected calcification. Skull: Normal. Negative for fracture or focal lesion. Sinuses/Orbits: Paranasal sinus postsurgical changes. Other: None. IMPRESSION: 1. No acute intracranial abnormality. 2. Advanced chronic microvascular ischemic changes of the supratentorial white matter. Electronically Signed   By: Keane Police D.O.   On: 09/13/2021 23:38   DG Chest Port 1 View  Result Date: 09/13/2021 CLINICAL DATA:  Hypoxia EXAM: PORTABLE CHEST 1 VIEW COMPARISON:  08/29/2021 FINDINGS: Cardiomegaly. Interstitial  prominence throughout the lungs, right greater than left. This could reflect interstitial edema or atypical infection. NG tube is in the stomach. Endotracheal tube is 3 cm above the carina. No effusions or acute bony abnormality. IMPRESSION: Cardiomegaly. Increasing interstitial prominence, right greater than left, favor interstitial edema although atypical infection cannot be excluded. Electronically Signed   By: Rolm Baptise M.D.   On: 09/13/2021 22:11   DG Chest 2 View  Result Date: 08/29/2021 CLINICAL DATA:  Pneumonia, organism unspecified(486) EXAM: CHEST - 2 VIEW COMPARISON:  Chest x-ray 08/14/2021 FINDINGS: Enlarged cardiac silhouette. The heart and mediastinal contours are unchanged. Aortic calcification. Interval resolution of left base airspace opacity. No focal consolidation. Chronic coarsened markings with no overt pulmonary edema. No pleural effusion.  No pneumothorax. No acute osseous abnormality. Diffuse decreased bone density. Chronic multilevel lower thoracic vertebral body height loss . IMPRESSION: 1. Interval resolution of left base airspace opacity. No active cardiopulmonary disease. 2.  Aortic Atherosclerosis (ICD10-I70.0). Electronically Signed   By: Iven Finn M.D.   On: 08/29/2021 16:32    Orson Eva, DO  [facility]  If 7PM-7AM, please contact night-coverage [email or web address] Password [facility] 09/14/2021, 8:47 AM   LOS: 1 day

## 2021-09-14 NOTE — Consult Note (Signed)
NAME:  [name], MRN:  [id number], DOB:  03/09/1945, LOS: 1 ADMISSION DATE:  09/13/2021, CONSULTATION DATE:  09/14/2021  REFERRING MD:  Tat, TRH , CHIEF COMPLAINT:  shock, intubated   History of Present Illness:  76 year old wheelchair bound with mental health issues, NHR, BIBEMS for less responsiveness and dyspnea with fever.  She was intubated in the ED for airway protection and hypoxia.  Chest x-ray showed interstitial edema and bibasilar infiltrates.  She was given 2 L fluid bolus for hypotension and started on Levophed peripherally Initial labs shows WBC count 17 K, BNP 229, head CT was negative UA showed more than 50 WBCs  Pertinent  Medical History  Type 2 diabetes Atrial fibrillation DVT 2006 Schedule [place], depression, psychosis, previous suicide attempt. CVA with left-sided weakness She has legal guardian -Kermit Balo  [facility] Events: Including procedures, antibiotic start and stop dates in addition to other pertinent events     Interim History / Subjective:  Intubated, sedated on Versed and fentanyl drips, propofol turned off overnight. Critically ill Afebrile  Objective   Blood pressure (!) 103/57, pulse 89, temperature 99.7 F (37.6 C), temperature source Axillary, resp. rate 15, height '5\' 7"'$  (1.702 m), weight 66.3 kg, SpO2 99 %.    Vent Mode: PRVC FiO2 (%):  [40 %-60 %] 40 % Set Rate:  [16 bmp] 16 bmp Vt Set:  [490 mL] 490 mL PEEP:  [5 cmH20] 5 cmH20 Plateau Pressure:  [20 cmH20-25 cmH20] 25 cmH20   Intake/Output Summary (Last 24 hours) at 09/14/2021 0944 Last data filed at 09/14/2021 2505 Gross per 24 hour  Intake 3774.87 ml  Output 1200 ml  Net 2574.87 ml   Filed Weights   09/13/21 2112 09/13/21 2151 09/14/21 0449  Weight: 63 kg 62.1 kg 66.3 kg    Examination: General: Elderly woman, sedated, no distress HENT: Pallor, no icterus, no JVD Lungs: Clear breath sounds bilateral, no accessory muscle use Cardiovascular: S1-S2  regular, no murmur Abdomen: Soft, nontender abdomen, no organomegaly Extremities: 1+ edema, no deformity Neuro: Sedate, RASS -3 on Versed and fentanyl drips GU: Clear urine  Chest x-ray independently reviewed, ET tube in position ABG improved Labs show normal electrolytes, normal renal function , decreased WBC count 15 K, low procalcitonin, lactate normal  Resolved Hospital Problem list     Assessment & Plan:  Septic shock -likely related to UTI, doubt bibasilar pneumonia. -Agree with empiric cefepime/vancomycin while awaiting culture data -Obtain urine culture and respiratory culture -Levophed to be titrated to systolic blood pressure of 90-100 since MAP found to be very low due to very low diastolic   Acute respiratory failure -with hypoxia -Intubated for airway protection -DC continuous Versed and use intermittent Versed and continuous fentanyl with goal RASS 0 to -1 -Start continuous breathing trials when more awake   Schizophrenia/depression -Resume home meds including Klonopin once Versed is cleared -May need transient Precedex to facilitate extubation  Best Practice (right click and "Reselect all SmartList Selections" daily)   Diet/type: NPO w/ meds via tube DVT prophylaxis: LMWH GI prophylaxis: PPI Lines: N/A Foley:  Yes, and it is still needed Code Status:  full code Last date of multidisciplinary goals of care discussion [updated daughter at bedside, she is ward of the state]  Labs   CBC: Recent Labs  Lab 09/13/21 2121 09/14/21 0310  WBC 17.1* 15.7*  NEUTROABS 15.0* 13.6*  HGB 10.0* 9.4*  HCT 31.3* 29.5*  MCV 86.9 87.0  PLT 273 216    Basic  Metabolic Panel: Recent Labs  Lab 09/13/21 2121 09/14/21 0310  NA 134* 135  K 4.1 4.0  CL 98 103  CO2 25 25  GLUCOSE 158* 196*  BUN 17 15  CREATININE 0.99 0.79  CALCIUM 8.3* 7.7*  MG  --  1.8  PHOS  --  3.8   GFR: Estimated Creatinine Clearance: 58.2 mL/min (by C-G formula based on SCr of 0.79  mg/dL). Recent Labs  Lab 09/13/21 2121 09/13/21 2301 09/14/21 0310 09/14/21 0351  PROCALCITON  --   --  0.18  --   WBC 17.1*  --  15.7*  --   LATICACIDVEN 1.5 2.3*  --  1.2    Liver Function Tests: Recent Labs  Lab 09/13/21 2121 09/14/21 0310  AST 23 19  ALT 19 16  ALKPHOS 84 64  BILITOT 0.6 1.0  PROT 7.5 6.0*  ALBUMIN 3.5 2.8*   No results for input(s): "LIPASE", "AMYLASE" in the last 168 hours. No results for input(s): "AMMONIA" in the last 168 hours.  ABG    Component Value Date/Time   PHART 7.42 09/14/2021 0421   PCO2ART 43 09/14/2021 0421   PO2ART 131 (H) 09/14/2021 0421   HCO3 27.9 09/14/2021 0421   O2SAT 100 09/14/2021 0421     Coagulation Profile: Recent Labs  Lab 09/13/21 2121 09/14/21 0310  INR 1.6* 1.6*    Cardiac Enzymes: No results for input(s): "CKTOTAL", "CKMB", "CKMBINDEX", "TROPONINI" in the last 168 hours.  HbA1C: HbA1c, POC (controlled diabetic range)  Date/Time Value Ref Range Status  08/16/2021 09:34 AM 6.2 0.0 - 7.0 % Final  12/09/2020 10:14 AM 7.5 (A) 0.0 - 7.0 % Final   Hgb A1c MFr Bld  Date/Time Value Ref Range Status  03/22/2021 09:38 AM 7.1 (H) 4.8 - 5.6 % Final    Comment:    (NOTE) Pre diabetes:          5.7%-6.4%  Diabetes:              >6.4%  Glycemic control for   <7.0% adults with diabetes     CBG: Recent Labs  Lab 09/14/21 0215 09/14/21 0728  GLUCAP 181* 195*    Review of Systems:   Unable to obtain since intubated and sedated  Past Medical History:  She,  has a past medical history of Anxiety, Arthritis, Atrial fibrillation ([place]), Atrial flutter ([place]), Collagen vascular disease ([place]), COVID-19 virus infection, Dependence on wheelchair, Depression, DVT, lower extremity, recurrent ([place]), Essential hypertension, GERD (gastroesophageal reflux disease), Hemiplegia ([place]) (2010), History of stroke, Leg DVT (deep venous thromboembolism), acute ([place]) (2006), Orthostatic hypotension, Schizophrenia ([place]), Stroke  ([place]), and Type 2 diabetes mellitus ([place]).   Surgical History:   Past Surgical History:  Procedure Laterality Date   BACK SURGERY     BIOPSY N/A 11/24/2014   Procedure: BIOPSY;  Surgeon: Danie Binder, MD;  Location: [facility];  Service: Endoscopy;  Laterality: N/A;   BIOPSY  05/17/2016   Procedure: BIOPSY;  Surgeon: Danie Binder, MD;  Location: [facility];  Service: Endoscopy;;  gastric biopsy   COLONOSCOPY WITH PROPOFOL N/A 11/24/2014   Dr. Rudie Meyer polyps removed/moderate sized internal hemorrhoids, tubular adenomas. Next surveillance in 3 years   ESOPHAGEAL DILATION N/A 07/23/2021   Procedure: ESOPHAGEAL DILATION;  Surgeon: Eloise Harman, DO;  [facility]: [facility];  Service: Endoscopy;  Laterality: N/A;   ESOPHAGOGASTRODUODENOSCOPY (EGD) WITH PROPOFOL N/A 11/24/2014   Dr. Clayburn Pert HH/patent stricture at the gastroesophageal junction, mild non-erosive gastritis, path negative for  H.pylori or celiac sprue   ESOPHAGOGASTRODUODENOSCOPY (EGD) WITH PROPOFOL N/A 05/17/2016   Procedure: ESOPHAGOGASTRODUODENOSCOPY (EGD) WITH PROPOFOL;  Surgeon: Danie Binder, MD;  [facility];  Service: Endoscopy;  Laterality: N/A;  12:45pm   ESOPHAGOGASTRODUODENOSCOPY (EGD) WITH PROPOFOL N/A 07/23/2021   Procedure: ESOPHAGOGASTRODUODENOSCOPY (EGD) WITH PROPOFOL;  Surgeon: Eloise Harman, DO;  [facility];  Service: Endoscopy;  Laterality: N/A;   GIVENS CAPSULE STUDY N/A 12/11/2014   MULTILPLE EROSION IN the stomach WITH ACTIVE OOZING. OCCASIONAL EROSIONS AND RARE ULCER SEEN IN PROXIMAL SMALL BOWEL . No masses or AVMs SEEN. NO OLD BLOOD OR FRESH BLOOD SEEN.    POLYPECTOMY N/A 11/24/2014   Procedure: POLYPECTOMY;  Surgeon: Danie Binder, MD;  Location: [facility];  Service: Endoscopy;  Laterality: N/A;   SAVORY DILATION N/A 05/17/2016   Procedure: SAVORY DILATION;  Surgeon: Danie Binder, MD;  [facility];  Service: Endoscopy;  Laterality: N/A;     Social History:    reports that she quit smoking about 26 years ago. Her smoking use included cigarettes. She has a 5.00 pack-year smoking history. She has never used smokeless tobacco. She reports that she does not drink alcohol and does not use drugs.   Family History:  Her family history includes Hypertension in her mother. There is no history of Colon cancer.   Allergies Allergies  Allergen Reactions   Sulfa Antibiotics Rash     Home Medications  Prior to Admission medications   Medication Sig Start Date End Date Taking? Authorizing Provider  ABILIFY 2 MG tablet Take 1 tablet (2 mg total) by mouth daily. Patient taking differently: Take 2 mg by mouth at bedtime. 11/09/20   [name], MD  acetaminophen (TYLENOL) 325 MG tablet Take 650 mg by mouth 3 (three) times daily as needed for mild pain or moderate pain.     [provider]  albuterol (VENTOLIN HFA) 108 (90 Base) MCG/ACT inhaler Inhale 2 puffs into the lungs every 6 (six) hours as needed for wheezing or shortness of breath. 05/20/21   Barton Dubois, MD  amiodarone (PACERONE) 200 MG tablet Take 1 tablet (200 mg total) by mouth daily. 04/24/21   Barton Dubois, MD  apixaban (ELIQUIS) 5 MG TABS tablet Take 1 tablet (5 mg total) by mouth 2 (two) times daily. 02/21/20   Richarda Osmond, MD  atorvastatin (LIPITOR) 40 MG tablet Take 1 tablet by mouth at bedtime. 05/30/20   [provider]  calcium carbonate (TUMS - DOSED IN MG ELEMENTAL CALCIUM) 500 MG chewable tablet Chew 1 tablet by mouth 4 (four) times daily as needed for heartburn.    [provider]  clonazePAM (KLONOPIN) 0.5 MG tablet Take 1 tablet (0.5 mg total) by mouth 3 (three) times daily as needed for anxiety. Patient taking differently: Take 0.5 mg by mouth daily. 05/20/21   Barton Dubois, MD  diltiazem (CARDIZEM CD) 120 MG 24 hr capsule Take 120 mg by mouth daily. 05/30/20   [provider]  EASYMAX TEST test strip USE TO CHECK BLOOD SUGAR FOUR TIMES  DAILY.(HOLD IF BS<70: CALL MD IF BS> 400) 07/21/21   Reardon, Juanetta Beets, NP  GOODSENSE ARTIFICIAL TEARS 0.5-0.6 % SOLN Apply to eye. 06/03/21   [provider]  HYDROcodone-acetaminophen (NORCO) 10-325 MG tablet Take 1 tablet by mouth every 8 (eight) hours as needed for severe pain. Patient taking differently: Take 1 tablet by mouth in the morning, at noon, and at bedtime. 05/20/21  Barton Dubois, MD  insulin detemir (LEVEMIR FLEXTOUCH) 100 UNIT/ML FlexPen INJECT 22 UNITS SUBCUTANEOUSLY AT BEDTIME.(HOLD IF BS<70: CALL MD IF BS> 400) Patient taking differently: 22 Units at bedtime. 07/15/21   Brita Romp, NP  Lancets Thin MISC USE TO CHECK BLOOD SUGAR FOUR TIMES DAILY.(HOLD IF BS<70: CALL MD IF BS> 400) 09/13/21   Reardon, Juanetta Beets, NP  latanoprost (XALATAN) 0.005 % ophthalmic solution Place 1 drop into both eyes at bedtime.    [provider]  loratadine (CLARITIN) 10 MG tablet Take 10 mg by mouth daily.    [provider]  lubiprostone (AMITIZA) 8 MCG capsule Take 1 capsule by mouth daily. 08/10/21   [provider]  metoCLOPramide (REGLAN) 5 MG tablet Take 5 mg by mouth in the morning, at noon, and at bedtime. If zofran is not working    [provider]  metoprolol succinate (TOPROL-XL) 50 MG 24 hr tablet Take 1 tablet (50 mg total) by mouth daily. Take with or immediately following a meal. 04/01/21   Tat, Shanon Brow, MD  midodrine (PROAMATINE) 10 MG tablet Take 10 mg by mouth 3 (three) times daily.    [provider]  mirtazapine (REMERON) 30 MG tablet Take 30 mg by mouth at bedtime.    [provider]  naloxegol oxalate (MOVANTIK) 12.5 MG TABS tablet Take 1 tablet (12.5 mg total) by mouth daily. 04/24/21   Barton Dubois, MD  NOVOLOG FLEXPEN 100 UNIT/ML FlexPen INJECT SUBCUTANEOUSLY AS FOLLOWS WITH MEALS: 90-150=10u: 151-200=11u: 201-250=12u: 251-300=13u: 301-350=14u: 351-400=15u: BS>400=16u & CALL MD. Patient taking differently: 10-16  Units 3 (three) times daily with meals. INJECT SUBCUTANEOUSLY AS FOLLOWS WITH MEALS: 90-150=10u: 151-200=11u: 201-250=12u: 251-300=13u: 301-350=14u: 351-400=15u: BS>400=16u & CALL MD. 02/28/21   Brita Romp, NP  ondansetron (ZOFRAN) 4 MG tablet Take 4 mg by mouth 2 (two) times daily. 06/24/21   [provider]  oxybutynin (DITROPAN-XL) 5 MG 24 hr tablet Take 5 mg by mouth daily. 04/20/21   [provider]  pantoprazole (PROTONIX) 40 MG tablet Take 1 tablet (40 mg total) by mouth 2 (two) times daily before a meal. 11/17/20   Mahala Menghini, PA-C  polyethylene glycol (MIRALAX / GLYCOLAX) 17 g packet Take 17 g by mouth daily. 04/24/21   Barton Dubois, MD  risperiDONE (RISPERDAL) 1 MG tablet Take 1-2 mg by mouth 2 (two) times daily. Take 1 tablet by mouth in the morning. Take 2 tablets by mouth at bedtime. 04/04/21   [provider]  SANTYL 250 UNIT/GM ointment Apply 1 Application topically daily. 07/05/21   [provider]  senna (SENOKOT) 8.6 MG tablet Take 1 tablet by mouth daily.    [provider]  Skin Protectants, Misc. (BAZA PROTECT EX) Apply 1 Application topically in the morning and at bedtime. 08/02/21   [provider]  TRADJENTA 5 MG TABS tablet TAKE (1) TABLET BY MOUTH ONCE DAILY. Patient taking differently: Take 5 mg by mouth daily. 04/04/21   Brita Romp, NP     Critical care time: 45 minutes       Kara Mead MD. [facility]. [facility] Pager : [phone]  If no response to pager , please call [phone] until 7 pm After 7:00 pm call Elink  [phone]   09/14/2021

## 2021-09-14 NOTE — Assessment & Plan Note (Addendum)
Continue IV lasix>>stop as pt more euvolemic now with HOCM 10/15/19 Echo EF 60-65%, no WMA, mild MR Repeat Echo EF 65-70%, +HOCM, no WMA Accurate I/O--6.1L out last 72 hours Cardiology consult appreciated

## 2021-09-14 NOTE — Assessment & Plan Note (Addendum)
-  UA >50 WBC - Urine culture MDR Ecoli - Blood culture neg to date - start nitrofurantoin

## 2021-09-14 NOTE — H&P (Signed)
History and Physical    Patient: [name] JOA:[id number] DOB: 1945/02/15 DOA: 09/13/2021 DOS: the patient was seen and examined on 09/14/2021 PCP: Carrolyn Meiers, MD  Patient coming from: SNF  Chief Complaint:  Chief Complaint  Patient presents with   Fever   HPI: [name] is a 76 y.o. female with medical history significant of anxiety, atrial fibrillation, wheelchair dependence, hypertension, GERD, DVT in 2006, type 2 diabetes mellitus, and more presents to ED with a chief complaint of fever.  Unfortunately patient is not able to provide any history at all as she is on the vent and sedated.  She was brought in by facility staff that reported she was less responsive and having difficulty breathing.  Also noted her to have a fever.  In the ED patient was not protecting her airway and was quite hypoxic despite nasal cannula.  She was intubated.  Work-up revealed a UTI and interstitial edema versus atypical infection on chest x-ray.  CT head did not show any abnormality.  Patient had 2 L bolus, and continued to be hypotensive so she was started on Levophed.  Admission requested for septic shock. Review of Systems: unable to review all systems due to the inability of the patient to answer questions. Past Medical History:  Diagnosis Date   Anxiety    Arthritis    Atrial fibrillation (HCC)    Atrial flutter ([place])    Collagen vascular disease ([place])    COVID-19 virus infection    COVID-19+ approx 01/24/19; asymptomatic course with full recovery   Dependence on wheelchair    pivot/transfers   Depression    History of psychosis and previous suicide attempt   DVT, lower extremity, recurrent ([place])    Long-term Coumadin per Dr. Legrand Rams   Essential hypertension    GERD (gastroesophageal reflux disease)    Hemiplegia ([place]) 2010   Left side   History of stroke    Acute infarct and right cerebral white matter small vessel disease 12/10   Leg DVT (deep venous thromboembolism),  acute ([place]) 2006   Orthostatic hypotension    Schizophrenia ([place])    Stroke ([place])    left sided weakness   Type 2 diabetes mellitus ([place])    Past Surgical History:  Procedure Laterality Date   BACK SURGERY     BIOPSY N/A 11/24/2014   Procedure: BIOPSY;  Surgeon: Danie Binder, MD;  Location: [facility];  Service: Endoscopy;  Laterality: N/A;   BIOPSY  05/17/2016   Procedure: BIOPSY;  Surgeon: Danie Binder, MD;  Location: [facility];  Service: Endoscopy;;  gastric biopsy   COLONOSCOPY WITH PROPOFOL N/A 11/24/2014   Dr. Rudie Meyer polyps removed/moderate sized internal hemorrhoids, tubular adenomas. Next surveillance in 3 years   ESOPHAGEAL DILATION N/A 07/23/2021   Procedure: ESOPHAGEAL DILATION;  Surgeon: Eloise Harman, DO;  Location: [facility];  Service: Endoscopy;  Laterality: N/A;   ESOPHAGOGASTRODUODENOSCOPY (EGD) WITH PROPOFOL N/A 11/24/2014   Dr. Clayburn Pert HH/patent stricture at the gastroesophageal junction, mild non-erosive gastritis, path negative for H.pylori or celiac sprue   ESOPHAGOGASTRODUODENOSCOPY (EGD) WITH PROPOFOL N/A 05/17/2016   Procedure: ESOPHAGOGASTRODUODENOSCOPY (EGD) WITH PROPOFOL;  Surgeon: Danie Binder, MD;  [facility];  Service: Endoscopy;  Laterality: N/A;  12:45pm   ESOPHAGOGASTRODUODENOSCOPY (EGD) WITH PROPOFOL N/A 07/23/2021   Procedure: ESOPHAGOGASTRODUODENOSCOPY (EGD) WITH PROPOFOL;  Surgeon: Eloise Harman, DO;  [facility];  Service: Endoscopy;  Laterality: N/A;   GIVENS CAPSULE STUDY N/A  12/11/2014   MULTILPLE EROSION IN the stomach WITH ACTIVE OOZING. OCCASIONAL EROSIONS AND RARE ULCER SEEN IN PROXIMAL SMALL BOWEL . No masses or AVMs SEEN. NO OLD BLOOD OR FRESH BLOOD SEEN.    POLYPECTOMY N/A 11/24/2014   Procedure: POLYPECTOMY;  Surgeon: Danie Binder, MD;  Location: [facility];  Service: Endoscopy;  Laterality: N/A;   SAVORY DILATION N/A 05/17/2016   Procedure: SAVORY DILATION;  Surgeon: Danie Binder, MD;  Location: [facility];  Service: Endoscopy;  Laterality: N/A;   Social History:  reports that she quit smoking about 26 years ago. Her smoking use included cigarettes. She has a 5.00 pack-year smoking history. She has never used smokeless tobacco. She reports that she does not drink alcohol and does not use drugs.  Allergies  Allergen Reactions   Sulfa Antibiotics Rash    Family History  Problem Relation Age of Onset   Hypertension Mother    Colon cancer Neg Hx     Prior to Admission medications   Medication Sig Start Date End Date Taking? Authorizing Provider  ABILIFY 2 MG tablet Take 1 tablet (2 mg total) by mouth daily. Patient taking differently: Take 2 mg by mouth at bedtime. 11/09/20   [name], MD  acetaminophen (TYLENOL) 325 MG tablet Take 650 mg by mouth 3 (three) times daily as needed for mild pain or moderate pain.     [provider]  albuterol (VENTOLIN HFA) 108 (90 Base) MCG/ACT inhaler Inhale 2 puffs into the lungs every 6 (six) hours as needed for wheezing or shortness of breath. 05/20/21   Barton Dubois, MD  amiodarone (PACERONE) 200 MG tablet Take 1 tablet (200 mg total) by mouth daily. 04/24/21   Barton Dubois, MD  apixaban (ELIQUIS) 5 MG TABS tablet Take 1 tablet (5 mg total) by mouth 2 (two) times daily. 02/21/20   Richarda Osmond, MD  atorvastatin (LIPITOR) 40 MG tablet Take 1 tablet by mouth at bedtime. 05/30/20   [provider]  calcium carbonate (TUMS - DOSED IN MG ELEMENTAL CALCIUM) 500 MG chewable tablet Chew 1 tablet by mouth 4 (four) times daily as needed for heartburn.    [provider]  clonazePAM (KLONOPIN) 0.5 MG tablet Take 1 tablet (0.5 mg total) by mouth 3 (three) times daily as needed for anxiety. Patient taking differently: Take 0.5 mg by mouth daily. 05/20/21   Barton Dubois, MD  diltiazem (CARDIZEM CD) 120 MG 24 hr capsule Take 120 mg by mouth daily. 05/30/20   [provider]  EASYMAX TEST test strip USE TO CHECK BLOOD  SUGAR FOUR TIMES DAILY.(HOLD IF BS<70: CALL MD IF BS> 400) 07/21/21   Reardon, Juanetta Beets, NP  GOODSENSE ARTIFICIAL TEARS 0.5-0.6 % SOLN Apply to eye. 06/03/21   [provider]  HYDROcodone-acetaminophen (NORCO) 10-325 MG tablet Take 1 tablet by mouth every 8 (eight) hours as needed for severe pain. Patient taking differently: Take 1 tablet by mouth in the morning, at noon, and at bedtime. 05/20/21   Barton Dubois, MD  insulin detemir (LEVEMIR FLEXTOUCH) 100 UNIT/ML FlexPen INJECT 22 UNITS SUBCUTANEOUSLY AT BEDTIME.(HOLD IF BS<70: CALL MD IF BS> 400) Patient taking differently: 22 Units at bedtime. 07/15/21   Brita Romp, NP  Lancets Thin MISC USE TO CHECK BLOOD SUGAR FOUR TIMES DAILY.(HOLD IF BS<70: CALL MD IF BS> 400) 09/13/21   Reardon, Juanetta Beets, NP  latanoprost (XALATAN) 0.005 % ophthalmic solution Place 1 drop into both eyes at bedtime.  [provider]  loratadine (CLARITIN) 10 MG tablet Take 10 mg by mouth daily.    [provider]  lubiprostone (AMITIZA) 8 MCG capsule Take 1 capsule by mouth daily. 08/10/21   [provider]  metoCLOPramide (REGLAN) 5 MG tablet Take 5 mg by mouth in the morning, at noon, and at bedtime. If zofran is not working    [provider]  metoprolol succinate (TOPROL-XL) 50 MG 24 hr tablet Take 1 tablet (50 mg total) by mouth daily. Take with or immediately following a meal. 04/01/21   Tat, Shanon Brow, MD  midodrine (PROAMATINE) 10 MG tablet Take 10 mg by mouth 3 (three) times daily.    [provider]  mirtazapine (REMERON) 30 MG tablet Take 30 mg by mouth at bedtime.    [provider]  naloxegol oxalate (MOVANTIK) 12.5 MG TABS tablet Take 1 tablet (12.5 mg total) by mouth daily. 04/24/21   Barton Dubois, MD  NOVOLOG FLEXPEN 100 UNIT/ML FlexPen INJECT SUBCUTANEOUSLY AS FOLLOWS WITH MEALS: 90-150=10u: 151-200=11u: 201-250=12u: 251-300=13u: 301-350=14u: 351-400=15u: BS>400=16u & CALL MD. Patient taking  differently: 10-16 Units 3 (three) times daily with meals. INJECT SUBCUTANEOUSLY AS FOLLOWS WITH MEALS: 90-150=10u: 151-200=11u: 201-250=12u: 251-300=13u: 301-350=14u: 351-400=15u: BS>400=16u & CALL MD. 02/28/21   Brita Romp, NP  ondansetron (ZOFRAN) 4 MG tablet Take 4 mg by mouth 2 (two) times daily. 06/24/21   [provider]  oxybutynin (DITROPAN-XL) 5 MG 24 hr tablet Take 5 mg by mouth daily. 04/20/21   [provider]  pantoprazole (PROTONIX) 40 MG tablet Take 1 tablet (40 mg total) by mouth 2 (two) times daily before a meal. 11/17/20   Mahala Menghini, PA-C  polyethylene glycol (MIRALAX / GLYCOLAX) 17 g packet Take 17 g by mouth daily. 04/24/21   Barton Dubois, MD  risperiDONE (RISPERDAL) 1 MG tablet Take 1-2 mg by mouth 2 (two) times daily. Take 1 tablet by mouth in the morning. Take 2 tablets by mouth at bedtime. 04/04/21   [provider]  SANTYL 250 UNIT/GM ointment Apply 1 Application topically daily. 07/05/21   [provider]  senna (SENOKOT) 8.6 MG tablet Take 1 tablet by mouth daily.    [provider]  Skin Protectants, Misc. (BAZA PROTECT EX) Apply 1 Application topically in the morning and at bedtime. 08/02/21   [provider]  TRADJENTA 5 MG TABS tablet TAKE (1) TABLET BY MOUTH ONCE DAILY. Patient taking differently: Take 5 mg by mouth daily. 04/04/21   Brita Romp, NP    Physical Exam: Vitals:   09/13/21 2350 09/14/21 0000 09/14/21 0030 09/14/21 0300  BP: (!) 116/55 (!) 114/56 112/62 (!) 103/45  Pulse: 89 92 83 88  Resp: '16 19 16 16  '$ Temp: (!) 100.6 F (38.1 C) (!) 100.6 F (38.1 C) (!) 100.4 F (38 C) 100 F (37.8 C)  TempSrc:      SpO2: 100% 100% 100% 100%  Weight:      Height:       1.  General: Patient lying supine in bed, resting comfortably on vent   2. Psychiatric: Sedated on vent   3. Neurologic: Sedated on vent, not following commands, withdrawing from pain   4. HEENMT:  Head is atraumatic,  normocephalic, pupils reactive to light, neck is supple, trachea is midline, mucous membranes are moist   5. Respiratory : Lungs are clear to auscultation bilaterally without wheezing, rhonchi, rales, breathing comfortable on vent   6. Cardiovascular : Heart rate normal, rhythm  is irregularly irregular, murmur present, rubs or gallops, no peripheral edema, peripheral pulses palpated   7. Gastrointestinal:  Abdomen is soft, nondistended, nontender to palpation bowel sounds active, no masses or organomegaly palpated   8. Skin:  Skin is warm, dry and intact without rashes, acute lesions, or ulcers on limited exam   9.Musculoskeletal:  No acute deformities or trauma, no asymmetry in tone, no peripheral edema, peripheral pulses palpated, no tenderness to palpation in the extremities  Data Reviewed: In the ED Temp 100.6, after Tylenol 97.3, heart rate 73-116, respiratory rate 16-25, blood pressure 70/43-133/66, satting 78-100% initially on room air and then on vent  Blood gas shows pH 7.3, PCO2 33 There is a leukocytosis at 17, hemoglobin 10.0 Chemistry is unremarkable aside from a slight hyperglycemia BNP 229 Trope 5, 6, Blood culture pending Chest x-ray shows interstitial edema and cardiomegaly CT head shows no acute intracranial abnormality Patient was recently admitted for pneumonia 1 month ago Patient presents for altered mental status/unresponsiveness, and is found to be in septic shock Assessment and Plan: * Septic shock (HCC) - Fever 100.6, tachycardia 116, respiratory rate 25, blood pressure 70/43, acute respiratory failure with an oxygen sat of 78%, leukocytosis 17, lactic acidosis 2.3 - Secondary to UTI most likely, pulmonary source is also a consideration - Continue IV antibiotics - Continue heparin drip - Patient is requiring 7 mcg of Levophed at this time - This is after receiving her 30 mL/kg bolus - Continue gentle hydration given possible interstitial pulmonary  edema - Continue cefepime and doxycycline to cover both UTI and pulmonary sources  Uncontrolled type 2 diabetes mellitus with hyperglycemia, with long-term current use of insulin ([place]) Patient takes 22 units of insulin at home - Continue at 10 units daily - Monitor CBG every 6 hours - Sliding scale if needed - Continue to monitor  GERD (gastroesophageal reflux disease) - Continue Protonix  Atrial fibrillation, chronic (HCC) - A-fib on EKG - Patient is not able to take p.o. at this time, Lovenox for anticoagulation - Patient is currently rate controlled, but current medication can be added if her rate starts to go up - Likely will need to resume home medications of diltiazem, metoprolol at discharge - Amiodarone per tube - Continue to monitor  UTI (urinary tract infection) - Cefepime - Urine culture pending - Blood culture pending  Acute metabolic encephalopathy - Described as less responsive - CT head shows no acute intracranial abnormality - No electrolyte derangement identified - Patient was hypotensive which is likely contributing at 70/43, but also hypoxic as she was not protecting her airway at 78% - Nasal cannula was attempted and patient sats did not improve adequately, patient was to altered to have BiPAP on, so she was intubated for protection of her airway - Initial pH 7.3, PCO2 33 - Likely altered mental status secondary to sepsis, hypoxia and hypotension - See treatment for septic shock  Acute respiratory failure with hypoxia ([place]) - Secondary to sepsis - Chest x-ray shows interstitial pulmonary edema versus atypical infection- patient was discharged July 23 after admission from pneumonia, those infiltrates should be resolved by now - Expectorated sputum pending - Patient covered with cefepime, and doxycycline - Wean off vent as tolerated - Repeat ABG at 5 AM - Troponin 5, 6 - Continue to monitor      Advance Care Planning:   Code Status: Full  Code  Consults: None  Family Communication: No family at bedside  Severity of Illness: The appropriate patient status  for this patient is INPATIENT. Inpatient status is judged to be reasonable and necessary in order to provide the required intensity of service to ensure the patient's safety. The patient's presenting symptoms, physical exam findings, and initial radiographic and laboratory data in the context of their chronic comorbidities is felt to place them at high risk for further clinical deterioration. Furthermore, it is not anticipated that the patient will be medically stable for discharge from the hospital within 2 midnights of admission.   * I certify that at the point of admission it is my clinical judgment that the patient will require inpatient hospital care spanning beyond 2 midnights from the point of admission due to high intensity of service, high risk for further deterioration and high frequency of surveillance required.*  Author: Rolla Plate, DO 09/14/2021 3:41 AM  For on call review [email or web address].

## 2021-09-14 NOTE — Assessment & Plan Note (Addendum)
07/23/2021 EGD--showed food in the middle third and lower third of the esophagus.  The patient was subsequently debated during the procedure secondary to aspiration risk.  90% of the retained food was removed with 10% pushed into the stomach.  A distal esophageal stricture was noted. -Continue full liquid diet once extubated -Continues to have dysphagia -07/25/21 barium swallow>>Nonspecific esophageal dysmotility disorder, with termination of primary peristaltic waves in the mid esophagus, and tertiary contractions in the esophagus Continue IV pantoprazole twice daily Tolerating dys 1 diet

## 2021-09-14 NOTE — Progress Notes (Signed)
eLink Physician-Brief Progress Note Patient Name: [name] DOB: 01-31-1945 MRN: [id number]   Date of Service  09/14/2021  HPI/Events of Note  Patient admitted with altered mental status, acute respiratory failure s/p intubation and mechanical ventilation, and r/o sepsis of urinary tract  +/- pulmonary origin.  eICU Interventions  New Patient Evaluation.        Kerry Kass Alaycia Eardley 09/14/2021, 2:18 AM

## 2021-09-14 NOTE — Progress Notes (Signed)
Pt transferred from ED 2 to ICU 4 on vent with no complications

## 2021-09-14 NOTE — Assessment & Plan Note (Addendum)
PDMP reviewed Norco 10/325, #90, last refilled on 09/01/21, 08/03/21 Clonazepam 0.5 mg, #90, last filled on 09/08/21, 08/26/21

## 2021-09-14 NOTE — Progress Notes (Signed)
[facility] ADULT ICU REPLACEMENT PROTOCOL   The patient does apply for the [facility] Adult ICU Electrolyte Replacment Protocol based on the criteria listed below:   1.Exclusion criteria: TCTS patients, ECMO patients, and Dialysis patients 2. Is GFR >/= 30 ml/min? Yes.    Patient's GFR today is >60 3. Is SCr </= 2? Yes.   Patient's SCr is 0.79 mg/dL 4. Did SCr increase >/= 0.5 in 24 hours? No. 5.Pt's weight >40kg  Yes.   6. Abnormal electrolyte(s): Mag 1.8  7. Electrolytes replaced per protocol 8.  Call MD STAT for K+ </= 2.5, Phos </= 1, or Mag </= 1 Physician:  Ignacia Marvel 09/14/2021 5:55 AM

## 2021-09-14 NOTE — Assessment & Plan Note (Signed)
-   A-fib on EKG - Patient is not able to take p.o. at this time, Lovenox for anticoagulation - Patient is currently rate controlled, but current medication can be added if her rate starts to go up - Likely will need to resume home medications of diltiazem, metoprolol at discharge - Amiodarone per tube - Continue to monitor

## 2021-09-14 NOTE — Progress Notes (Signed)
*  PRELIMINARY RESULTS* Echocardiogram 2D Echocardiogram has been performed.  [name] 09/14/2021, 2:51 PM

## 2021-09-15 ENCOUNTER — Inpatient Hospital Stay: Payer: Self-pay

## 2021-09-15 DIAGNOSIS — R6521 Severe sepsis with septic shock: Secondary | ICD-10-CM | POA: Diagnosis not present

## 2021-09-15 DIAGNOSIS — I10 Essential (primary) hypertension: Secondary | ICD-10-CM

## 2021-09-15 DIAGNOSIS — A419 Sepsis, unspecified organism: Secondary | ICD-10-CM | POA: Diagnosis not present

## 2021-09-15 DIAGNOSIS — T801XXA Vascular complications following infusion, transfusion and therapeutic injection, initial encounter: Secondary | ICD-10-CM

## 2021-09-15 DIAGNOSIS — G9341 Metabolic encephalopathy: Secondary | ICD-10-CM | POA: Diagnosis not present

## 2021-09-15 DIAGNOSIS — J9601 Acute respiratory failure with hypoxia: Secondary | ICD-10-CM | POA: Diagnosis not present

## 2021-09-15 DIAGNOSIS — I5031 Acute diastolic (congestive) heart failure: Secondary | ICD-10-CM | POA: Diagnosis not present

## 2021-09-15 DIAGNOSIS — A499 Bacterial infection, unspecified: Secondary | ICD-10-CM

## 2021-09-15 LAB — GLUCOSE, CAPILLARY
Glucose-Capillary: 152 mg/dL — ABNORMAL HIGH (ref 70–99)
Glucose-Capillary: 155 mg/dL — ABNORMAL HIGH (ref 70–99)
Glucose-Capillary: 155 mg/dL — ABNORMAL HIGH (ref 70–99)
Glucose-Capillary: 148 mg/dL — ABNORMAL HIGH (ref 70–99)
Glucose-Capillary: 127 mg/dL — ABNORMAL HIGH (ref 70–99)
Glucose-Capillary: 193 mg/dL — ABNORMAL HIGH (ref 70–99)
Glucose-Capillary: 207 mg/dL — ABNORMAL HIGH (ref 70–99)

## 2021-09-15 LAB — BASIC METABOLIC PANEL
Anion gap: 9 (ref 5–15)
BUN: 11 mg/dL (ref 8–23)
CO2: 27 mmol/L (ref 22–32)
Calcium: 7.9 mg/dL — ABNORMAL LOW (ref 8.9–10.3)
Chloride: 97 mmol/L — ABNORMAL LOW (ref 98–111)
Creatinine, Ser: 0.67 mg/dL (ref 0.44–1.00)
GFR, Estimated: >60 mL/min (ref >60)
Glucose, Bld: 153 mg/dL — ABNORMAL HIGH (ref 70–99)
Potassium: 2.9 mmol/L — ABNORMAL LOW (ref 3.5–5.1)
Sodium: 133 mmol/L — ABNORMAL LOW (ref 135–145)

## 2021-09-15 LAB — CBC
HCT: 31.5 % — ABNORMAL LOW (ref 36.0–46.0)
Hemoglobin: 10.3 g/dL — ABNORMAL LOW (ref 12.0–15.0)
MCH: 27.6 pg (ref 26.0–34.0)
MCHC: 32.7 g/dL (ref 30.0–36.0)
MCV: 84.5 fL (ref 80.0–100.0)
Platelets: 231 10*3/uL (ref 150–400)
RBC: 3.73 MIL/uL — ABNORMAL LOW (ref 3.87–5.11)
RDW: 14.7 % (ref 11.5–15.5)
WBC: 16.6 10*3/uL — ABNORMAL HIGH (ref 4.0–10.5)
nRBC: 0 % (ref 0.0–0.2)

## 2021-09-15 LAB — MAGNESIUM: Magnesium: 1.9 mg/dL (ref 1.7–2.4)

## 2021-09-15 MED ORDER — ARIPIPRAZOLE 2 MG PO TABS
2.0000 mg | ORAL_TABLET | Freq: Every day | ORAL | Status: DC
  Administered 2021-09-15 – 2021-09-19 (×5): 2 mg via ORAL
  Filled 2021-09-15 (×7): qty 1

## 2021-09-15 MED ORDER — DULOXETINE HCL 20 MG PO CPEP
60.0000 mg | ORAL_CAPSULE | Freq: Every day | ORAL | Status: DC
  Administered 2021-09-16 – 2021-09-19 (×4): 60 mg via ORAL
  Filled 2021-09-15: qty 1
  Filled 2021-09-15 (×5): qty 3

## 2021-09-15 MED ORDER — STERILE WATER FOR INJECTION IJ SOLN
INTRAMUSCULAR | Status: AC
  Administered 2021-09-15: 10 mL
  Filled 2021-09-15: qty 10

## 2021-09-15 MED ORDER — MAGNESIUM SULFATE 2 GM/50ML IV SOLN
2.0000 g | Freq: Once | INTRAVENOUS | Status: AC
  Administered 2021-09-15: 2 g via INTRAVENOUS
  Filled 2021-09-15: qty 50

## 2021-09-15 MED ORDER — CLONAZEPAM 0.5 MG PO TABS
0.5000 mg | ORAL_TABLET | Freq: Three times a day (TID) | ORAL | Status: DC
  Administered 2021-09-15 – 2021-09-19 (×13): 0.5 mg via ORAL
  Filled 2021-09-15 (×14): qty 1

## 2021-09-15 MED ORDER — PHENTOLAMINE MESYLATE 5 MG IJ SOLR
5.0000 mg | Freq: Once | INTRAMUSCULAR | Status: AC
  Administered 2021-09-15: 5 mg via SUBCUTANEOUS
  Filled 2021-09-15: qty 5

## 2021-09-15 MED ORDER — VANCOMYCIN HCL IN DEXTROSE 1-5 GM/200ML-% IV SOLN
1000.0000 mg | INTRAVENOUS | Status: DC
Start: 2021-09-15 — End: 2021-09-16
  Administered 2021-09-15: 1000 mg via INTRAVENOUS
  Filled 2021-09-15: qty 200

## 2021-09-15 MED ORDER — POTASSIUM CHLORIDE 20 MEQ PO PACK
40.0000 meq | PACK | Freq: Once | ORAL | Status: AC
  Administered 2021-09-15: 40 meq
  Filled 2021-09-15: qty 2

## 2021-09-15 MED ORDER — DEXMEDETOMIDINE HCL IN NACL 400 MCG/100ML IV SOLN
0.4000 ug/kg/h | INTRAVENOUS | Status: DC
  Administered 2021-09-15: 0.4 ug/kg/h via INTRAVENOUS
  Filled 2021-09-15: qty 100

## 2021-09-15 MED ORDER — BUSPIRONE HCL 5 MG PO TABS
7.5000 mg | ORAL_TABLET | Freq: Two times a day (BID) | ORAL | Status: DC
  Administered 2021-09-15 – 2021-09-19 (×9): 7.5 mg via ORAL
  Filled 2021-09-15 (×9): qty 2

## 2021-09-15 MED ORDER — POTASSIUM CHLORIDE CRYS ER 20 MEQ PO TBCR
40.0000 meq | EXTENDED_RELEASE_TABLET | Freq: Two times a day (BID) | ORAL | Status: DC
  Administered 2021-09-15 – 2021-09-19 (×9): 40 meq via ORAL
  Filled 2021-09-15 (×10): qty 2

## 2021-09-15 MED ORDER — POTASSIUM CHLORIDE 10 MEQ/100ML IV SOLN
10.0000 meq | INTRAVENOUS | Status: AC
  Administered 2021-09-15 (×4): 10 meq via INTRAVENOUS
  Filled 2021-09-15 (×4): qty 100

## 2021-09-15 MED ORDER — FENTANYL CITRATE PF 50 MCG/ML IJ SOSY: 25.0000 ug | PREFILLED_SYRINGE | INTRAMUSCULAR | Status: DC | PRN

## 2021-09-15 NOTE — Progress Notes (Signed)
Completed wake up assessment, lowered sedation to 1/2 the level. Patient became agitated, heart rate, respiratory rate, and blood pressure elevated significantly. Sedation returned to previous level and IV push of Versed administered.

## 2021-09-15 NOTE — Progress Notes (Signed)
At 1440 patient extubated per MD . RT extubated patient to 3L O2 via nasal cannula. Patient remained stable throughout, patient SATs 100%, HR 90 and RR 26. No complications noted, RN at bedside. RT will continue to monitor and assess.

## 2021-09-15 NOTE — Progress Notes (Signed)
Pt's PIV with levophed infusing infiltrated in upper right arm, nurse immediately changed to a functional IV, placed heating compress on affected area, marked boarder, notified MD and pharmacy, standing orders to administer phentolamine, nurse will continue to monitor.

## 2021-09-15 NOTE — Progress Notes (Signed)
Initial Nutrition Assessment  DOCUMENTATION CODES:      INTERVENTION:  RD will follow for diet advancement  NUTRITION DIAGNOSIS:   Inadequate oral intake related to inability to eat as evidenced by NPO status.  GOAL:  Patient will meet greater than or equal to 90% of their needs  MONITOR:  Diet advancement, Labs, I & O's, Weight trends  REASON FOR ASSESSMENT:   Malnutrition Screening Tool, Ventilator    ASSESSMENT:  Patient is a 76 yo from ALF with a history of HTN, DM2, CVA, schizophrenia and anxiety. She presents with septic shock, altered mental status and hypoxia. Multiple hospitalizations the past 6 months.  Intubated on 8/22 @ 0937 for airway protection.   Patient successfully extubated 8/24 at 1440. Family at bedside. Patient NPO at this time pending swallow screen. RD will follow for diet advancement and address nutrition concerns.  Weights reviewed. Currently 58.5 kg.      Latest Ref Rng & Units 09/15/2021    2:53 AM 09/14/2021    3:10 AM 09/13/2021    9:21 PM  BMP  Glucose 70 - 99 mg/dL 153  196  158   BUN 8 - 23 mg/dL '11  15  17   '$ Creatinine 0.44 - 1.00 mg/dL 0.67  0.79  0.99   Sodium 135 - 145 mmol/L 133  135  134   Potassium 3.5 - 5.1 mmol/L 2.9  4.0  4.1   Chloride 98 - 111 mmol/L 97  103  98   CO2 22 - 32 mmol/L '27  25  25   '$ Calcium 8.9 - 10.3 mg/dL 7.9  7.7  8.3      NUTRITION - FOCUSED PHYSICAL EXAM: Deferred   Diet Order:   Diet Order             Diet NPO time specified Except for: Ice Chips  Diet effective now                   EDUCATION NEEDS:  Not appropriate for education at this time  Skin:  Skin Assessment: Reviewed RN Assessment  Last BM:  incontinent of bowel  Height:   Ht Readings from Last 1 Encounters:  09/15/21 '5\' 7"'$  (1.702 m)    Weight:   Wt Readings from Last 1 Encounters:  09/15/21 58.5 kg    Ideal Body Weight:   61 kg  BMI:  Body mass index is 20.2 kg/m.  Estimated Nutritional Needs:   Kcal:   1700-1800  Protein:  85-90 gr  Fluid:  >1600 ml daily   Colman Cater MS,RD,CSG,LDN Contact: Shea Evans

## 2021-09-15 NOTE — Progress Notes (Signed)
[facility] ADULT ICU REPLACEMENT PROTOCOL   The patient does apply for the [facility] Adult ICU Electrolyte Replacment Protocol based on the criteria listed below:   1.Exclusion criteria: TCTS patients, ECMO patients, and Dialysis patients 2. Is GFR >/= 30 ml/min? Yes.    Patient's GFR today is >60 3. Is SCr </= 2? Yes.   Patient's SCr is 0.67 mg/dL 4. Did SCr increase >/= 0.5 in 24 hours? No. 5.Pt's weight >40kg  Yes.   6. Abnormal electrolyte(s): Mag 1.9, K+ 2.9  7. Electrolytes replaced per protocol 8.  Call MD STAT for K+ </= 2.5, Phos </= 1, or Mag </= 1 Physician:  Randie Heinz 09/15/2021 4:39 AM

## 2021-09-15 NOTE — Progress Notes (Signed)
Attempted SBT on patient but she was very agitated. Wants mittens off and grabs at staff. HR and RR were elevated. Once SBT was started her RR increased to 40 bpm and HR was in the 110-120s. Will attempt again on dayshift.

## 2021-09-15 NOTE — Assessment & Plan Note (Addendum)
IV with levophed infiltrated in right arm --given phentolamine --no signs of necrosis presently

## 2021-09-15 NOTE — Progress Notes (Signed)
[name] was admitted to the hospital on 09/13/2021.  She was critically ill and admitted to the ICU.  She has had 6 hospital admissions in the last 5 months.  She had continued to declined medically with each subsequent hospitalization.  Given the patient's multiple co-morbidities, frailty, and frequent repeat hospitalization, I feel it is in the patient's best interest to change her Code Status to DNR to minimize any further pain and suffering and to preserve her dignity.  This was discussed with the patient's legal guardian, [name], who agrees.  Orson Eva, DO

## 2021-09-15 NOTE — Progress Notes (Addendum)
NAME:  [name], MRN:  [id number], DOB:  November 24, 1945, LOS: 2 ADMISSION DATE:  09/13/2021, CONSULTATION DATE:  09/15/2021  REFERRING MD:  Tat, TRH , CHIEF COMPLAINT:  shock, intubated   History of Present Illness:  76 year old wheelchair bound with mental health issues, NHR, BIBEMS for less responsiveness and dyspnea with fever.  She was intubated in the ED for airway protection and hypoxia.  Chest x-ray showed interstitial edema and bibasilar infiltrates.  She was given 2 L fluid bolus for hypotension and started on Levophed peripherally Initial labs shows WBC count 17 K, BNP 229, head CT was negative UA showed more than 50 WBCs  Pertinent  Medical History  Type 2 diabetes Atrial fibrillation DVT 2006 Schizophrenia, depression, psychosis, previous suicide attempt. CVA with left-sided weakness She has legal guardian -Kermit Balo  [facility] Events: Including procedures, antibiotic start and stop dates in addition to other pertinent events   Echo 8/23 HOCM physiology Urine culture 8/22 E. coli  Interim History / Subjective:   Critically ill, intubated Sedated on fentanyl drip, agitated when this was turned off this morning. Yellow secretions  Objective   Blood pressure (!) 157/97, pulse (!) 120, temperature 99.7 F (37.6 C), resp. rate (!) 28, height '5\' 7"'$  (1.702 m), weight 58.5 kg, SpO2 98 %.    Vent Mode: PRVC FiO2 (%):  [35 %-40 %] 35 % Set Rate:  [16 bmp] 16 bmp Vt Set:  [490 mL] 490 mL PEEP:  [5 cmH20] 5 cmH20 Plateau Pressure:  [17 cmH20-25 cmH20] 24 cmH20   Intake/Output Summary (Last 24 hours) at 09/15/2021 1138 Last data filed at 09/15/2021 1125 Gross per 24 hour  Intake 2202.49 ml  Output 2513 ml  Net -310.51 ml    Filed Weights   09/13/21 2151 09/14/21 0449 09/15/21 0314  Weight: 62.1 kg 66.3 kg 58.5 kg    Examination: General: Elderly woman, sedated, no distress HENT: Pallor, no icterus, no JVD Lungs: No accessory muscle use, clear  breath sounds bilateral Cardiovascular: S1-S2 regular, no murmur Abdomen: Soft, nontender abdomen, no organomegaly Extremities: 1+ edema, no deformity Neuro: Sedate, RASS -1 on  fentanyl drip GU: Clear urine  Chest x-ray independently reviewed, ET tube in position Labs show hyponatremia and severe hypokalemia, leukocytosis  Resolved Hospital Problem list     Assessment & Plan:  Septic shock -likely related to E. coli UTI, doubt bibasilar pneumonia. -Agree with empiric cefepime/vancomycin while awaiting culture data -Can discontinue vancomycin if respiratory culture does not show MRSA -Levophed to be titrated to systolic blood pressure of 90-100 since MAP found to be very low due to very low diastolic   Acute respiratory failure -with hypoxia -Intubated for airway protection -Start spontaneous breathing trials with goal extubation  HOCM  -avoid tachycardia and hypovolemia -May need to add low-dose Lopressor if heart rate does not decrease  Agitation on vent Schizophrenia/depression -Resume home meds including Klonopin , Abilify and Cymbalta -Start Precedex for goal RASS 0 to -1 -Use Versed and fentanyl intermittent only  Best Practice (right click and "Reselect all SmartList Selections" daily)   Diet/type: NPO w/ meds via tube if not extubated today should start tube feeds DVT prophylaxis: LMWH GI prophylaxis: PPI Lines: N/A Foley:  Yes, and it is still needed Code Status:  full code Last date of multidisciplinary goals of care discussion [updated daughter at bedside, she is ward of the state]  Labs   CBC: Recent Labs  Lab 09/13/21 2121 09/14/21 0310 09/15/21 0253  WBC  17.1* 15.7* 16.6*  NEUTROABS 15.0* 13.6*  --   HGB 10.0* 9.4* 10.3*  HCT 31.3* 29.5* 31.5*  MCV 86.9 87.0 84.5  PLT 273 216 231     Basic Metabolic Panel: Recent Labs  Lab 09/13/21 2121 09/14/21 0310 09/15/21 0253  NA 134* 135 133*  K 4.1 4.0 2.9*  CL 98 103 97*  CO2 '25 25 27  '$ GLUCOSE  158* 196* 153*  BUN '17 15 11  '$ CREATININE 0.99 0.79 0.67  CALCIUM 8.3* 7.7* 7.9*  MG  --  1.8 1.9  PHOS  --  3.8  --     GFR: Estimated Creatinine Clearance: 55.3 mL/min (by C-G formula based on SCr of 0.67 mg/dL). Recent Labs  Lab 09/13/21 2121 09/13/21 2301 09/14/21 0310 09/14/21 0351 09/15/21 0253  PROCALCITON  --   --  0.18  --   --   WBC 17.1*  --  15.7*  --  16.6*  LATICACIDVEN 1.5 2.3*  --  1.2  --      Liver Function Tests: Recent Labs  Lab 09/13/21 2121 09/14/21 0310  AST 23 19  ALT 19 16  ALKPHOS 84 64  BILITOT 0.6 1.0  PROT 7.5 6.0*  ALBUMIN 3.5 2.8*    No results for input(s): "LIPASE", "AMYLASE" in the last 168 hours. No results for input(s): "AMMONIA" in the last 168 hours.  ABG    Component Value Date/Time   PHART 7.42 09/14/2021 0421   PCO2ART 43 09/14/2021 0421   PO2ART 131 (H) 09/14/2021 0421   HCO3 27.9 09/14/2021 0421   O2SAT 100 09/14/2021 0421     Coagulation Profile: Recent Labs  Lab 09/13/21 2121 09/14/21 0310  INR 1.6* 1.6*     Cardiac Enzymes: No results for input(s): "CKTOTAL", "CKMB", "CKMBINDEX", "TROPONINI" in the last 168 hours.  HbA1C: HbA1c, POC (controlled diabetic range)  Date/Time Value Ref Range Status  08/16/2021 09:34 AM 6.2 0.0 - 7.0 % Final  12/09/2020 10:14 AM 7.5 (A) 0.0 - 7.0 % Final   Hgb A1c MFr Bld  Date/Time Value Ref Range Status  03/22/2021 09:38 AM 7.1 (H) 4.8 - 5.6 % Final    Comment:    (NOTE) Pre diabetes:          5.7%-6.4%  Diabetes:              >6.4%  Glycemic control for   <7.0% adults with diabetes     CBG: Recent Labs  Lab 09/14/21 1532 09/14/21 1921 09/14/21 2308 09/15/21 0312 09/15/21 0757  GLUCAP 183* 163* 208* 152* 155*     Critical care time: 40 minutes       Kara Mead MD. FCCP. [facility] Pager : [phone]  If no response to pager , please call [phone] until 7 pm After 7:00 pm call Elink  [phone]    09/15/2021   Addendum - Tolerating spontaneous breathing trial pressure support 5/5 for 30 minutes, proceed with extubation   2:30 PM

## 2021-09-15 NOTE — Progress Notes (Signed)
PROGRESS NOTE  [name] QIW:[id number] DOB: 1945/04/30 DOA: 09/13/2021 PCP: Carrolyn Meiers, MD  Brief History:  76 year old female with a history of schizophrenia, hypertension, diabetes mellitus type 2, DVT, anxiety, and atrial fibrillation presenting with altered mental status and hypoxia.  The patient normally resides at [facility]. Unfortunately patient is not able to provide any history at all as she is on the vent and sedated.  She was brought in by facility staff that reported she was less responsive and having difficulty breathing.  Also noted her to have a fever.  In the ED patient was not protecting her airway and was quite hypoxic despite nasal cannula.  She was intubated.  Work-up revealed a UTI and interstitial edema versus atypical infection on chest x-ray.  CT head did not show any abnormality.  Patient had 2 L bolus, and continued to be hypotensive so she was started on Levophed.  Admission requested for septic shock. PCCM consulted to assist with management.    Assessment and Plan: * Septic shock (HCC) - Fever 100.6, tachycardia 116, respiratory rate 25, blood pressure 70/43, acute respiratory failure with an oxygen sat of 78%, leukocytosis 17, lactic acidosis 2.3 - Secondary to UTI  - Continue IV antibiotics - Patient is requiring 7 mcg of Levophed at this time - This is after receiving her 30 mL/kg bolus - Continue gentle hydration given possible interstitial pulmonary edema - Continue cefepime and doxycycline   Schizophrenia (HCC) Continue  Risperdal, Remeron, Cymbalta--restarted Continue clonazepam--restarted  Uncontrolled type 2 diabetes mellitus with hyperglycemia, with long-term current use of insulin (HCC) Continue Levemir 10 units hx Continue NovoLog sliding scale 03/22/2021 hemoglobin A1c 7.1  GERD (gastroesophageal reflux disease) - Continue Protonix  Acute respiratory failure with hypoxia (HCC) - Secondary to pulmonary  edema - Chest x-ray shows interstitial pulmonary edema versus atypical infection- patient was discharged July 23 after admission from pneumonia, those infiltrates should be resolved by now - trach aspirate culture pending -appreciate PCCM--discussed with Dr. Elsworth Soho --wean to extubation --MRSA screen is positive --follow trach aspirate culture  Acute diastolic CHF (congestive heart failure) (HCC) Continue IV lasix Remains clinically fluid overloaded 10/15/19 Echo EF 60-65%, no WMA, mild MR Repeat Echo EF 65-70%, +HOCM, no WMA Accurate I/O--3.8L out yesterday  UTI (urinary tract infection) - Cefepime-continue -UA >50 WBC - Urine culture Ecoli - Blood culture neg to date  DVT, lower extremity, recurrent (HCC) On  lovenox currently>>plan to transition back to apixaban ultimately  Stricture and stenosis of esophagus 07/23/2021 EGD--showed food in the middle third and lower third of the esophagus.  The patient was subsequently debated during the procedure secondary to aspiration risk.  90% of the retained food was removed with 10% pushed into the stomach.  A distal esophageal stricture was noted. -Continue full liquid diet once extubated -Continues to have dysphagia -7/3 barium swallow>>Nonspecific esophageal dysmotility disorder, with termination of primary peristaltic waves in the mid esophagus, and tertiary contractions in the esophagus Continue IV pantoprazole twice daily  Opioid dependence ([place]) PDMP reviewed Norco 10/325, #90, last refilled on 07/07/2021, 06/09/2018 Clonazepam 0.5 mg, #90, last filled on 07/07/2021, 06/13/2021.  Essential hypertension Holding dilitazem and metoprolol succinate due to hypotension  IV infiltration IV with levophed infiltrated in right arm --give phentolamine --monitor closely for tissue necrosis  Acute metabolic encephalopathy - Described as less responsive - CT head shows no acute intracranial abnormality - Patient was hypotensive which is  likely  contributing at 70/43, but also hypoxic as she was not protecting her airway at 78% - intubated in ED to protect airway - Initial pH 7.3, PCO2 33 -  altered mental status secondary to sepsis, hypoxia and hypotension   Paroxysmal atrial fibrillation (HCC) Continue lovenox for [facility] with plans to switch back to apixaban Continue amiodarone Holding metoprolol succinate and diltiazem CD due to hypotension Having some RVR as expected May need to start amiodarone drip   Family Communication:   daughter updated at bedside 8/23   Consultants:  PCCM   Code Status:  FULL    DVT Prophylaxis:  [place] Lovenox     Procedures: As Listed in Progress Note Above   Antibiotics: Cefepime 8/22>> Doxy 8/22>> Vanc 8/22>>        Subjective:  Pt is awake and alert.  Remains intubated.  Denies f/c, cp, sob, abd pain. Objective: Vitals:   09/15/21 0830 09/15/21 0845 09/15/21 0900 09/15/21 1000  BP: 109/71  (!) 91/49 (!) 157/97  Pulse: (!) 118 (!) 111 (!) 101 (!) 120  Resp: '16 16 16 '$ (!) 28  Temp: 99.5 F (37.5 C) 99.7 F (37.6 C) 99.7 F (37.6 C) 99.7 F (37.6 C)  TempSrc:      SpO2: 100% 100% 100% 98%  Weight:      Height:   '5\' 7"'$  (1.702 m)     Intake/Output Summary (Last 24 hours) at 09/15/2021 1253 Last data filed at 09/15/2021 1246 Gross per 24 hour  Intake 2378.95 ml  Output 2513 ml  Net -134.05 ml   Weight change: -4.55 kg Exam:  General:  Pt is alert, follows commands appropriately, not in acute distress HEENT: No icterus, No thrush, No neck mass, [place]/AT Cardiovascular: RRR, S1/S2, no rubs, no gallops Respiratory: bibasilar crackles . No wheeze Abdomen: Soft/+BS, non tender, non distended, no guarding Extremities: No edema, No lymphangitis, No petechiae, No rashes, no synovitis   Data Reviewed: I have personally reviewed following labs and imaging studies Basic Metabolic Panel: Recent Labs  Lab 09/13/21 2121 09/14/21 0310 09/15/21 0253  NA 134* 135 133*  K  4.1 4.0 2.9*  CL 98 103 97*  CO2 '25 25 27  '$ GLUCOSE 158* 196* 153*  BUN '17 15 11  '$ CREATININE 0.99 0.79 0.67  CALCIUM 8.3* 7.7* 7.9*  MG  --  1.8 1.9  PHOS  --  3.8  --    Liver Function Tests: Recent Labs  Lab 09/13/21 2121 09/14/21 0310  AST 23 19  ALT 19 16  ALKPHOS 84 64  BILITOT 0.6 1.0  PROT 7.5 6.0*  ALBUMIN 3.5 2.8*   No results for input(s): "LIPASE", "AMYLASE" in the last 168 hours. No results for input(s): "AMMONIA" in the last 168 hours. Coagulation Profile: Recent Labs  Lab 09/13/21 2121 09/14/21 0310  INR 1.6* 1.6*   CBC: Recent Labs  Lab 09/13/21 2121 09/14/21 0310 09/15/21 0253  WBC 17.1* 15.7* 16.6*  NEUTROABS 15.0* 13.6*  --   HGB 10.0* 9.4* 10.3*  HCT 31.3* 29.5* 31.5*  MCV 86.9 87.0 84.5  PLT 273 216 231   Cardiac Enzymes: No results for input(s): "CKTOTAL", "CKMB", "CKMBINDEX", "TROPONINI" in the last 168 hours. BNP: Invalid input(s): "POCBNP" CBG: Recent Labs  Lab 09/14/21 1921 09/14/21 2308 09/15/21 0312 09/15/21 0757 09/15/21 1132  GLUCAP 163* 208* 152* 155* 155*   HbA1C: No results for input(s): "HGBA1C" in the last 72 hours. Urine analysis:    Component Value Date/Time   COLORURINE YELLOW 09/13/2021 2212  APPEARANCEUR CLOUDY (A) 09/13/2021 2212   APPEARANCEUR Cloudy (A) 05/01/2016 1525   LABSPEC 1.017 09/13/2021 2212   PHURINE 6.0 09/13/2021 2212   GLUCOSEU NEGATIVE 09/13/2021 2212   HGBUR NEGATIVE 09/13/2021 2212   BILIRUBINUR NEGATIVE 09/13/2021 2212   BILIRUBINUR Negative 05/01/2016 [place] 09/13/2021 2212   PROTEINUR 30 (A) 09/13/2021 2212   UROBILINOGEN 0.2 09/14/2014 1703   NITRITE POSITIVE (A) 09/13/2021 2212   LEUKOCYTESUR LARGE (A) 09/13/2021 2212   Sepsis Labs: '@LABRCNTIP'$ (procalcitonin:4,lacticidven:4) ) Recent Results (from the past 240 hour(s))  Resp Panel by RT-PCR (Flu A&B, Covid) Anterior Nasal Swab     Status: None   Collection Time: 09/13/21  9:20 PM   Specimen: Anterior  Nasal Swab  Result Value Ref Range Status   SARS Coronavirus 2 by RT PCR NEGATIVE NEGATIVE Final    Comment: (NOTE) SARS-CoV-2 target nucleic acids are NOT DETECTED.  The SARS-CoV-2 RNA is generally detectable in upper respiratory specimens during the acute phase of infection. The lowest concentration of SARS-CoV-2 viral copies this assay can detect is 138 copies/mL. A negative result does not preclude SARS-Cov-2 infection and should not be used as the sole basis for treatment or other patient management decisions. A negative result may occur with  improper specimen collection/handling, submission of specimen other than nasopharyngeal swab, presence of viral mutation(s) within the areas targeted by this assay, and inadequate number of viral copies(<138 copies/mL). A negative result must be combined with clinical observations, patient history, and epidemiological information. The expected result is Negative.  Fact Sheet for Patients:  EntrepreneurPulse.com.au  Fact Sheet for Healthcare Providers:  IncredibleEmployment.be  This test is no t yet approved or cleared by the [place] FDA and  has been authorized for detection and/or diagnosis of SARS-CoV-2 by FDA under an Emergency Use Authorization (EUA). This EUA will remain  in effect (meaning this test can be used) for the duration of the COVID-19 declaration under Section 564(b)(1) of the Act, 21 U.S.C.section 360bbb-3(b)(1), unless the authorization is terminated  or revoked sooner.       Influenza A by PCR NEGATIVE NEGATIVE Final   Influenza B by PCR NEGATIVE NEGATIVE Final    Comment: (NOTE) The Xpert Xpress SARS-CoV-2/FLU/RSV plus assay is intended as an aid in the diagnosis of influenza from Nasopharyngeal swab specimens and should not be used as a sole basis for treatment. Nasal washings and aspirates are unacceptable for Xpert Xpress SARS-CoV-2/FLU/RSV testing.  Fact Sheet for  Patients: EntrepreneurPulse.com.au  Fact Sheet for Healthcare Providers: IncredibleEmployment.be  This test is not yet approved or cleared by the [place] FDA and has been authorized for detection and/or diagnosis of SARS-CoV-2 by FDA under an Emergency Use Authorization (EUA). This EUA will remain in effect (meaning this test can be used) for the duration of the COVID-19 declaration under Section 564(b)(1) of the Act, 21 U.S.C. section 360bbb-3(b)(1), unless the authorization is terminated or revoked.  Performed at [facility], [address], [place], [place] [address]   Culture, blood (routine x 2)     Status: None (Preliminary result)   Collection Time: 09/13/21  9:20 PM   Specimen: BLOOD RIGHT HAND  Result Value Ref Range Status   Specimen Description BLOOD RIGHT HAND  Final   Special Requests   Final    BOTTLES DRAWN AEROBIC AND ANAEROBIC Blood Culture results may not be optimal due to an inadequate volume of blood received in culture bottles   Culture   Final  NO GROWTH 2 DAYS Performed at [facility], [address] [place], [place] [address]    Report Status PENDING  Incomplete  Urine Culture     Status: Abnormal (Preliminary result)   Collection Time: 09/13/21 10:12 PM   Specimen: Urine, Clean Catch  Result Value Ref Range Status   Specimen Description   Final    URINE, CLEAN CATCH Performed at [facility], [address], [place], [place] [address]    Special Requests   Final    NONE Performed at [facility], [address] [place], [place] [address]    Culture >=100,000 COLONIES/mL ESCHERICHIA COLI (A)  Final   Report Status PENDING  Incomplete  Culture, blood (routine x 2)     Status: None (Preliminary result)   Collection Time: 09/13/21 11:01 PM   Specimen: BLOOD  Result Value Ref Range Status   Specimen Description BLOOD  Final   Special Requests NONE  Final   Culture   Final    NO GROWTH 2 DAYS Performed at  [facility], [address] [place], [place] [address]    Report Status PENDING  Incomplete  MRSA Next Gen by PCR, Nasal     Status: Abnormal   Collection Time: 09/14/21  1:50 AM   Specimen: Nasal Mucosa; Nasal Swab  Result Value Ref Range Status   MRSA by PCR Next Gen DETECTED (A) NOT DETECTED Final    Comment: RESULT CALLED TO, READ BACK BY AND VERIFIED WITH: SUPPER,B'@0507'$  BY MATTHEWS, B 8.23.23 (NOTE) The GeneXpert MRSA Assay (FDA approved for NASAL specimens only), is one component of a comprehensive MRSA colonization surveillance program. It is not intended to diagnose MRSA infection nor to guide or monitor treatment for MRSA infections. Test performance is not FDA approved in patients less than 11 years old. Performed at [facility], [address] [place], [place] [address]   Expectorated Sputum Assessment w Gram Stain, Rflx to Resp Cult     Status: None (Preliminary result)   Collection Time: 09/14/21  2:48 AM   Specimen: Expectorated Sputum  Result Value Ref Range Status   Specimen Description   Final    EXPECTORATED SPUTUM Performed at [facility], [address], [place], [place] [address]    Special Requests   Final    NONE Performed at [facility], [address], [place], [place] [address]    Sputum evaluation   Final    THIS SPECIMEN IS ACCEPTABLE FOR SPUTUM CULTURE Performed at [facility], [place] [address] [place], [place] [address]    Report Status PENDING  Incomplete  Culture, Respiratory w Gram Stain     Status: None (Preliminary result)   Collection Time: 09/14/21  2:48 AM  Result Value Ref Range Status   Specimen Description EXPECTORATED SPUTUM  Final   Special Requests NONE Reflexed from [id number]  Final   Gram Stain   Final    MODERATE WBC PRESENT,BOTH PMN AND MONONUCLEAR FEW GRAM POSITIVE COCCI IN PAIRS RARE GRAM POSITIVE RODS    Culture   Final    CULTURE REINCUBATED FOR BETTER GROWTH Performed at [facility], [place] [address]  [place],  [address]    Report Status PENDING  Incomplete     Scheduled Meds:  amiodarone  200 mg Per Tube Daily   ARIPiprazole  2 mg Oral Daily   busPIRone  7.5 mg Oral BID   Chlorhexidine Gluconate Cloth  6 each Topical Q0600   clonazePAM  0.5  mg Oral TID   DULoxetine  60 mg Oral Daily   enoxaparin (LOVENOX) injection  60 mg Subcutaneous Q12H   furosemide  40 mg Intravenous BID   insulin aspart  0-9 Units Subcutaneous Q4H   insulin detemir  10 Units Subcutaneous QHS   midodrine  10 mg Per Tube TID   mupirocin ointment   Nasal BID   mouth rinse  15 mL Mouth Rinse Q2H   pantoprazole (PROTONIX) IV  40 mg Intravenous Q12H   phentolamine  5 mg Subcutaneous Once   Continuous Infusions:  ceFEPime (MAXIPIME) IV 200 mL/hr at 09/15/21 1246   dexmedetomidine (PRECEDEX) IV infusion Stopped (09/15/21 1204)   doxycycline (VIBRAMYCIN) IV Stopped (09/15/21 0521)   norepinephrine (LEVOPHED) Adult infusion 3 mcg/min (09/15/21 1246)   vancomycin      Procedures/Studies: [place] EKG SITE RITE  Result Date: 09/15/2021 If Site Rite image not attached, placement could not be confirmed due to current cardiac rhythm.  ECHOCARDIOGRAM COMPLETE  Result Date: 09/14/2021    ECHOCARDIOGRAM REPORT   Patient Name:   KRISINDA GIOVANNI Date of Exam: 09/14/2021 Medical Rec #:  [id number]       Height:       67.0 in Accession #:    [id number]      Weight:       146.2 lb Date of Birth:  11-23-1945       BSA:          1.770 m Patient Age:    83 years        BP:           129/67 mmHg Patient Gender: F               HR:           84 bpm. Exam Location:  [facility] Procedure: 2D Echo, Cardiac Doppler and Color Doppler Indications:    CHF  History:        Patient has prior history of Echocardiogram examinations, most                 recent 10/05/2019. CHF, Stroke, Arrythmias:Atrial Fibrillation,                 Signs/Symptoms:Altered Mental Status; Risk Factors:Hypertension,                 Diabetes and Former Smoker.   Sonographer:    Wenda Low Referring Phys: [phone] Ronson Hagins  Sonographer Comments: Echo performed with patient supine and on artificial respirator. IMPRESSIONS  1. Severe asymmetric hypertrophy of the basal septum up to 3m. There is evidence of SAM of the MV leaflets with obstruction up to 37 mmHG (variation noted due to afib). 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 Would consider a cardiac MRI for better characterization. This was not well characterized on the last echocardiogram. Left ventricular ejection fraction, by estimation, is 65 to 70%. The left ventricle has normal function. The left ventricle has no regional wall motion abnormalities. There is severe asymmetric left ventricular hypertrophy of the basal-septal segment. Left ventricular diastolic function could not be evaluated.  2. Right ventricular systolic function is normal. The right ventricular size is normal.  3. Left atrial size was mildly dilated.  4. Right atrial size was mildly dilated.  5. The mitral valve is degenerative. Trivial mitral valve regurgitation. No evidence of mitral stenosis.  6. The aortic valve is tricuspid. There is mild calcification of the aortic valve. There is mild thickening of  the aortic valve. Aortic valve regurgitation is not visualized. Aortic valve sclerosis/calcification is present, without any evidence of aortic stenosis. FINDINGS  Left Ventricle: Severe asymmetric hypertrophy of the basal septum up to 43m. There is evidence of SAM of the MV leaflets with obstruction up to 37 mmHG (variation noted due to afib). 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  Would consider a cardiac MRI for better characterization. This was not well characterized on the last echocardiogram. Left ventricular ejection fraction, by estimation, is 65 to 70%. The left ventricle has normal function. The left ventricle has no regional wall motion abnormalities. The left  ventricular internal cavity size was normal in size. There is severe asymmetric left ventricular hypertrophy of the basal-septal segment. Left ventricular diastolic function could not be evaluated due to atrial fibrillation. Left ventricular diastolic function could not be evaluated. Right Ventricle: The right ventricular size is normal. No increase in right ventricular wall thickness. Right ventricular systolic function is normal. Left Atrium: Left atrial size was mildly dilated. Right Atrium: Right atrial size was mildly dilated. Pericardium: Trivial pericardial effusion is present. Presence of epicardial fat layer. Mitral Valve: The mitral valve is degenerative in appearance. Trivial mitral valve regurgitation. No evidence of mitral valve stenosis. MV peak gradient, 7.3 mmHg. The mean mitral valve gradient is 2.0 mmHg. Tricuspid Valve: The tricuspid valve is grossly normal. Tricuspid valve regurgitation is mild . No evidence of tricuspid stenosis. Aortic Valve: The aortic valve is tricuspid. There is mild calcification of the aortic valve. There is mild thickening of the aortic valve. Aortic valve regurgitation is not visualized. Aortic valve sclerosis/calcification is present, without any evidence of aortic stenosis. Aortic valve mean gradient measures 7.5 mmHg. Aortic valve peak gradient measures 16.2 mmHg. Aortic valve area, by VTI measures 2.37 cm. Pulmonic Valve: The pulmonic valve was grossly normal. Pulmonic valve regurgitation is not visualized. No evidence of pulmonic stenosis. Aorta: The aortic root and ascending aorta are structurally normal, with no evidence of dilitation. Venous: IVC assessment for right atrial pressure unable to be performed due to mechanical ventilation. IAS/Shunts: The atrial septum is grossly normal.  LEFT VENTRICLE PLAX 2D LVIDd:         3.80 cm LVIDs:         1.80 cm LV PW:         1.30 cm LV IVS:        1.70 cm LVOT diam:     2.00 cm LV SV:         84 LV SV Index:   48 LVOT  Area:     3.14 cm  RIGHT VENTRICLE RV Basal diam:  3.70 cm RV Mid diam:    3.10 cm RV S prime:     12.80 cm/s LEFT ATRIUM             Index        RIGHT ATRIUM           Index LA diam:        4.50 cm 2.54 cm/m   RA Area:     18.10 cm LA Vol (A2C):   62.5 ml 35.31 ml/m  RA Volume:   53.70 ml  30.34 ml/m LA Vol (A4C):   62.0 ml 35.03 ml/m LA Biplane Vol: 62.6 ml 35.37 ml/m  AORTIC VALVE                     PULMONIC VALVE AV Area (Vmax):    2.31 cm  PV Vmax:       0.87 m/s AV Area (Vmean):   2.40 cm      PV Peak grad:  3.0 mmHg AV Area (VTI):     2.37 cm AV Vmax:           201.50 cm/s AV Vmean:          123.200 cm/s AV VTI:            0.355 m AV Peak Grad:      16.2 mmHg AV Mean Grad:      7.5 mmHg LVOT Vmax:         148.00 cm/s LVOT Vmean:        94.200 cm/s LVOT VTI:          0.268 m LVOT/AV VTI ratio: 0.75  AORTA Ao Root diam: 3.70 cm Ao Asc diam:  3.40 cm MITRAL VALVE                TRICUSPID VALVE MV Area (PHT): 3.95 cm     TR Peak grad:   35.0 mmHg MV Area VTI:   3.43 cm     TR Vmax:        296.00 cm/s MV Peak grad:  7.3 mmHg MV Mean grad:  2.0 mmHg     SHUNTS MV Vmax:       1.35 m/s     Systemic VTI:  0.27 m MV Vmean:      64.5 cm/s    Systemic Diam: 2.00 cm MV Decel Time: 192 msec MV E velocity: 118.00 cm/s Eleonore Chiquito MD Electronically signed by Eleonore Chiquito MD Signature Date/Time: 09/14/2021/3:29:21 PM    Final    CT Head Wo Contrast  Result Date: 09/13/2021 CLINICAL DATA:  Headache, new or worsening. EXAM: CT HEAD WITHOUT CONTRAST TECHNIQUE: Contiguous axial images were obtained from the base of the skull through the vertex without intravenous contrast. RADIATION DOSE REDUCTION: This exam was performed according to the departmental dose-optimization program which includes automated exposure control, adjustment of the mA and/or kV according to patient size and/or use of iterative reconstruction technique. COMPARISON:  CT examination dated August 04, 2021 FINDINGS: Brain: No evidence of  acute infarction, hemorrhage, hydrocephalus, extra-axial collection or mass lesion/mass effect. Low-attenuation of the periventricular and subcortical white matter presumed advanced chronic microvascular ischemic changes. Vascular: No hyperdense vessel or unexpected calcification. Skull: Normal. Negative for fracture or focal lesion. Sinuses/Orbits: Paranasal sinus postsurgical changes. Other: None. IMPRESSION: 1. No acute intracranial abnormality. 2. Advanced chronic microvascular ischemic changes of the supratentorial white matter. Electronically Signed   By: Keane Police D.O.   On: 09/13/2021 23:38   DG Chest Port 1 View  Result Date: 09/13/2021 CLINICAL DATA:  Hypoxia EXAM: PORTABLE CHEST 1 VIEW COMPARISON:  08/29/2021 FINDINGS: Cardiomegaly. Interstitial prominence throughout the lungs, right greater than left. This could reflect interstitial edema or atypical infection. NG tube is in the stomach. Endotracheal tube is 3 cm above the carina. No effusions or acute bony abnormality. IMPRESSION: Cardiomegaly. Increasing interstitial prominence, right greater than left, favor interstitial edema although atypical infection cannot be excluded. Electronically Signed   By: Rolm Baptise M.D.   On: 09/13/2021 22:11   DG Chest 2 View  Result Date: 08/29/2021 CLINICAL DATA:  Pneumonia, organism unspecified([id number]) EXAM: CHEST - 2 VIEW COMPARISON:  Chest x-ray 08/14/2021 FINDINGS: Enlarged cardiac silhouette. The heart and mediastinal contours are unchanged. Aortic calcification. Interval resolution of left base airspace opacity. No focal consolidation. Chronic coarsened markings  with no overt pulmonary edema. No pleural effusion. No pneumothorax. No acute osseous abnormality. Diffuse decreased bone density. Chronic multilevel lower thoracic vertebral body height loss . IMPRESSION: 1. Interval resolution of left base airspace opacity. No active cardiopulmonary disease. 2.  Aortic Atherosclerosis (ICD10-I70.0).  Electronically Signed   By: Iven Finn M.D.   On: 08/29/2021 16:32    Orson Eva, DO  [facility]  If 7PM-7AM, please contact night-coverage [email or web address] Password [facility] 09/15/2021, 12:53 PM   LOS: 2 days

## 2021-09-16 DIAGNOSIS — Z7189 Other specified counseling: Secondary | ICD-10-CM | POA: Diagnosis not present

## 2021-09-16 DIAGNOSIS — F1120 Opioid dependence, uncomplicated: Secondary | ICD-10-CM

## 2021-09-16 DIAGNOSIS — A419 Sepsis, unspecified organism: Secondary | ICD-10-CM | POA: Diagnosis not present

## 2021-09-16 DIAGNOSIS — I4891 Unspecified atrial fibrillation: Secondary | ICD-10-CM | POA: Diagnosis not present

## 2021-09-16 DIAGNOSIS — I422 Other hypertrophic cardiomyopathy: Secondary | ICD-10-CM | POA: Diagnosis not present

## 2021-09-16 DIAGNOSIS — J9601 Acute respiratory failure with hypoxia: Secondary | ICD-10-CM | POA: Diagnosis not present

## 2021-09-16 DIAGNOSIS — R6521 Severe sepsis with septic shock: Secondary | ICD-10-CM | POA: Diagnosis not present

## 2021-09-16 DIAGNOSIS — I5031 Acute diastolic (congestive) heart failure: Secondary | ICD-10-CM | POA: Diagnosis not present

## 2021-09-16 DIAGNOSIS — L8990 Pressure ulcer of unspecified site, unspecified stage: Secondary | ICD-10-CM | POA: Insufficient documentation

## 2021-09-16 LAB — CBC
HCT: 34.2 % — ABNORMAL LOW (ref 36.0–46.0)
Hemoglobin: 11.2 g/dL — ABNORMAL LOW (ref 12.0–15.0)
MCH: 27.6 pg (ref 26.0–34.0)
MCHC: 32.7 g/dL (ref 30.0–36.0)
MCV: 84.2 fL (ref 80.0–100.0)
Platelets: 251 10*3/uL (ref 150–400)
RBC: 4.06 MIL/uL (ref 3.87–5.11)
RDW: 14.4 % (ref 11.5–15.5)
WBC: 13.5 10*3/uL — ABNORMAL HIGH (ref 4.0–10.5)
nRBC: 0 % (ref 0.0–0.2)

## 2021-09-16 LAB — MAGNESIUM: Magnesium: 2 mg/dL (ref 1.7–2.4)

## 2021-09-16 LAB — CULTURE, RESPIRATORY W GRAM STAIN

## 2021-09-16 LAB — GLUCOSE, CAPILLARY
Glucose-Capillary: 127 mg/dL — ABNORMAL HIGH (ref 70–99)
Glucose-Capillary: 153 mg/dL — ABNORMAL HIGH (ref 70–99)
Glucose-Capillary: 131 mg/dL — ABNORMAL HIGH (ref 70–99)
Glucose-Capillary: 195 mg/dL — ABNORMAL HIGH (ref 70–99)
Glucose-Capillary: 115 mg/dL — ABNORMAL HIGH (ref 70–99)

## 2021-09-16 LAB — BASIC METABOLIC PANEL
Anion gap: 7 (ref 5–15)
BUN: 13 mg/dL (ref 8–23)
CO2: 29 mmol/L (ref 22–32)
Calcium: 8.4 mg/dL — ABNORMAL LOW (ref 8.9–10.3)
Chloride: 99 mmol/L (ref 98–111)
Creatinine, Ser: 0.63 mg/dL (ref 0.44–1.00)
GFR, Estimated: >60 mL/min (ref >60)
Glucose, Bld: 157 mg/dL — ABNORMAL HIGH (ref 70–99)
Potassium: 3.8 mmol/L (ref 3.5–5.1)
Sodium: 135 mmol/L (ref 135–145)

## 2021-09-16 MED ORDER — AMIODARONE HCL 200 MG PO TABS
200.0000 mg | ORAL_TABLET | Freq: Every day | ORAL | Status: DC
  Administered 2021-09-16 – 2021-09-19 (×4): 200 mg via ORAL
  Filled 2021-09-16 (×4): qty 1

## 2021-09-16 MED ORDER — MIDODRINE HCL 5 MG PO TABS
10.0000 mg | ORAL_TABLET | Freq: Three times a day (TID) | ORAL | Status: DC
  Administered 2021-09-16 – 2021-09-19 (×10): 10 mg via ORAL
  Filled 2021-09-16 (×11): qty 2

## 2021-09-16 MED ORDER — METOPROLOL SUCCINATE ER 50 MG PO TB24
50.0000 mg | ORAL_TABLET | Freq: Every day | ORAL | Status: DC
  Administered 2021-09-16: 50 mg via ORAL
  Filled 2021-09-16: qty 1

## 2021-09-16 MED ORDER — SODIUM CHLORIDE 0.9 % IV SOLN
1.0000 g | INTRAVENOUS | Status: DC
  Administered 2021-09-16 – 2021-09-18 (×3): 1 g via INTRAVENOUS
  Filled 2021-09-16 (×3): qty 10

## 2021-09-16 MED ORDER — HYDROCODONE-ACETAMINOPHEN 5-325 MG PO TABS
1.0000 | ORAL_TABLET | Freq: Four times a day (QID) | ORAL | Status: DC | PRN
  Administered 2021-09-16 – 2021-09-19 (×11): 1 via ORAL
  Filled 2021-09-16 (×11): qty 1

## 2021-09-16 MED ORDER — KETOROLAC TROMETHAMINE 15 MG/ML IJ SOLN
15.0000 mg | Freq: Once | INTRAMUSCULAR | Status: AC
  Administered 2021-09-16: 15 mg via INTRAVENOUS
  Filled 2021-09-16: qty 1

## 2021-09-16 MED ORDER — PROCHLORPERAZINE EDISYLATE 10 MG/2ML IJ SOLN
10.0000 mg | Freq: Four times a day (QID) | INTRAMUSCULAR | Status: DC | PRN
  Administered 2021-09-16 – 2021-09-17 (×2): 10 mg via INTRAVENOUS
  Filled 2021-09-16 (×2): qty 2

## 2021-09-16 NOTE — Assessment & Plan Note (Signed)
Discussed with patient's Legal Guardian, [name] --agrees with DNR

## 2021-09-16 NOTE — Progress Notes (Signed)
Called to patients room to assess resp status. Patient was asleep and breathing 40 times a min. Sats 99% on 3 lpm nasal cannula and bilateral fine crackles noted on breath sounds. Patient with congested cough was extubated today. Woke patient up to ask if she was short of breath she states yes but then asks to be taken outside. Patient is agitated and continues to ask to go outside. Placed patient on BIPAP to see if it would help with her breathing. Patient continues to breath 30-40 times a min, HR is now 130 and agitation has increased. Patient is also pulling at mask. MD made aware by RN awaiting further orders.

## 2021-09-16 NOTE — Consult Note (Signed)
Cardiology Consultation:   Patient ID: [name] MRN: [id number]; DOB: 27-Sep-1945  Admit date: 09/13/2021 Date of Consult: 09/16/2021  PCP:  Carrolyn Meiers, MD   [facility] [facility] Cardiologist:  Rozann Lesches, MD        Patient Profile:   [name] is a 76 y.o. female with a hx of persistent atrial fibrillation and flutter, schizophrenia, recurrent DVT, anxiety, arthritis, depression with prior psychosis, HTN, orthostatic hypotension requiring midodrine, GERD, CVA, DM, mild carotid disease by duplex 2018, LVH, mild MR, LVH by prior echo, frequent admissions for medical problems who is being seen 09/16/2021 for the evaluation of atrial fibrillation at the request of Dr. Carles Collet.  History of Present Illness:   The patient was remotely followed for history of chest pain and SVT, then was diagnosed with new onset atrial fib in 2021. She spontaneously converted to NSR on IV Cardizem. She was continued on PTA Coumadin which is also for hx of DVT managed by primary care and started on metoprolol. At one point she was eventually changed to Eliquis. She had atrial flutter in followup in 09/2019 so was started on Cardizem. Stress test 09/2019 was low risk (inferior defect likely due to subdiaphragmatic attenuation, cannot completely exclude very mild inferior ischemia. Either findign would support low risk). She has had recurrent atrial arrhythmias in the context of recurrent medical issues including SBO and PNA, last seen by cardiology during 03/2021 admission for pneumonia with sepsis and was started on amiodarone. She was still in atrial fib prior to discharge. In follow-up 05/2021 she was still out of rhythm, with plan to continue present regimen and f/u 6 months. She has had multiple presentations back to the hospital since that time for various issues including fecal impaction, recurrent PNA, weakness, n/v, esophageal stricture, and somnolence. She presented back to the hospital  this admission with fever, altered mental status, wheezing, shortness of breath and weakness. Per notes she was brought in by staff at her facility after being found unresponsive with difficulty breathing. She was tachycardic, febrile, tachypneic, hypoxic, and severely hypotensive on arrival requiring pressors and intubation, diagnosed with septic shock. Other hospital issues include positive MRSA screen, UTI, IV infiltration, and metabolic encephalopathy. She has been treated with IV abx and IV Lasix. She was extubated 09/15/21 though this AM respiratory notes report tachypnea and agitation, placed on BiPAP but she did not tolerate this. 2D Echo 09/14/21 showed severe asymmetric hypertrophy of the basal septum up to 70m, evidence of SAM of the MV leaflets with obstruction up to 37 mmHg (variation noted due to afib), findings c/w HOCM, EF 65-70%, normal RV, mild BAE. Has had AF RVR this admission which is essentially expected at this juncture given degree of illness and underlying persistent arrhythmia. Cardiology asked to assist with management. IM has discussed code status with patient/legal guardian and she is DNR.  Labs otherwise notable for leukocytosis 17k on arrival, Covid neg, BNP 229, troponins negative, elevated lactic acid, TSH wnl, hyponatremia down to 133 this admission, hypokalemia during admission down to 2.9. Last K 3.8, Cr 0.63, Hgb 11.2. Off pressors. Had RVR during periods of agitation. Current cardiac meds include oral amiodarone '200mg'$  daily, Toprol '50mg'$  daily, IV Lasix '40mg'$  BID. Home diltiazem not resumed. I/O's positive 897, -157UOP yesterday.  She is not a reliable historian. [facility] Says she doesn't know how her breathing is. Reports chronic back pain but no chest pain. Appears to have  increased WOB. Most recent VS not crossing over into epic but HR 124 and BP 94/75.   Past Medical History:  Diagnosis Date   Anxiety    Arthritis    Atrial  fibrillation (HCC)    Atrial flutter ([place])    Collagen vascular disease ([place])    COVID-19 virus infection    COVID-19+ approx 01/24/19; asymptomatic course with full recovery   Dependence on wheelchair    pivot/transfers   Depression    History of psychosis and previous suicide attempt   DVT, lower extremity, recurrent ([place])    Long-term Coumadin per Dr. Legrand Rams   Essential hypertension    GERD (gastroesophageal reflux disease)    Hemiplegia ([place]) 2010   Left side   History of stroke    Acute infarct and right cerebral white matter small vessel disease 12/10   Leg DVT (deep venous thromboembolism), acute ([place]) 2006   Orthostatic hypotension    Schizophrenia ([place])    Stroke ([place])    left sided weakness   Type 2 diabetes mellitus ([place])     Past Surgical History:  Procedure Laterality Date   BACK SURGERY     BIOPSY N/A 11/24/2014   Procedure: BIOPSY;  Surgeon: Danie Binder, MD;  Location: [facility];  Service: Endoscopy;  Laterality: N/A;   BIOPSY  05/17/2016   Procedure: BIOPSY;  Surgeon: Danie Binder, MD;  Location[facility];  Service: Endoscopy;;  gastric biopsy   COLONOSCOPY WITH PROPOFOL N/A 11/24/2014   Dr. Rudie Meyer polyps removed/moderate sized internal hemorrhoids, tubular adenomas. Next surveillance in 3 years   ESOPHAGEAL DILATION N/A 07/23/2021   Procedure: ESOPHAGEAL DILATION;  Surgeon: Eloise Harman, DO;  [facility];  Service: Endoscopy;  Laterality: N/A;   ESOPHAGOGASTRODUODENOSCOPY (EGD) WITH PROPOFOL N/A 11/24/2014   Dr. Clayburn Pert HH/patent stricture at the gastroesophageal junction, mild non-erosive gastritis, path negative for H.pylori or celiac sprue   ESOPHAGOGASTRODUODENOSCOPY (EGD) WITH PROPOFOL N/A 05/17/2016   Procedure: ESOPHAGOGASTRODUODENOSCOPY (EGD) WITH PROPOFOL;  Surgeon: Danie Binder, MD;  [facility];  Service: Endoscopy;  Laterality: N/A;  12:45pm   ESOPHAGOGASTRODUODENOSCOPY (EGD) WITH PROPOFOL N/A 07/23/2021    Procedure: ESOPHAGOGASTRODUODENOSCOPY (EGD) WITH PROPOFOL;  Surgeon: Eloise Harman, DO;  [facility];  Service: Endoscopy;  Laterality: N/A;   GIVENS CAPSULE STUDY N/A 12/11/2014   MULTILPLE EROSION IN the stomach WITH ACTIVE OOZING. OCCASIONAL EROSIONS AND RARE ULCER SEEN IN PROXIMAL SMALL BOWEL . No masses or AVMs SEEN. NO OLD BLOOD OR FRESH BLOOD SEEN.    POLYPECTOMY N/A 11/24/2014   Procedure: POLYPECTOMY;  Surgeon: Danie Binder, MD;  Location: [facility];  Service: Endoscopy;  Laterality: N/A;   SAVORY DILATION N/A 05/17/2016   Procedure: SAVORY DILATION;  Surgeon: Danie Binder, MD;  [facility];  Service: Endoscopy;  Laterality: N/A;     Home Medications:  Prior to Admission medications   Medication Sig Start Date End Date Taking? Authorizing Provider  ABILIFY 2 MG tablet Take 1 tablet (2 mg total) by mouth daily. Patient taking differently: Take 2 mg by mouth at bedtime. 11/09/20  Yes [name], MD  acetaminophen (TYLENOL) 325 MG tablet Take 650 mg by mouth 3 (three) times daily as needed for mild pain or moderate pain.    Yes [provider]  albuterol (VENTOLIN HFA) 108 (90 Base) MCG/ACT inhaler Inhale 2 puffs into the lungs every 6 (six) hours as needed for wheezing or shortness of breath. 05/20/21  Yes Barton Dubois, MD  amiodarone (PACERONE) 200 MG tablet Take 1 tablet (200 mg total) by mouth daily. 04/24/21  Yes Barton Dubois, MD  apixaban (ELIQUIS) 5 MG TABS tablet Take 1 tablet (5 mg total) by mouth 2 (two) times daily. 02/21/20  Yes Richarda Osmond, MD  atorvastatin (LIPITOR) 40 MG tablet Take 1 tablet by mouth at bedtime. 05/30/20  Yes [provider]  busPIRone (BUSPAR) 7.5 MG tablet Take 7.5 mg by mouth 2 (two) times daily. 08/31/21  Yes [provider]  calcium carbonate (TUMS - DOSED IN MG ELEMENTAL CALCIUM) 500 MG chewable tablet Chew 1 tablet by mouth 4 (four) times daily as needed for heartburn.   Yes [provider]  cholecalciferol (VITAMIN D3) 25 MCG (1000 UNIT) tablet Take 2,000 Units by mouth daily.   Yes [provider]  clonazePAM (KLONOPIN) 0.5 MG tablet Take 1 tablet (0.5 mg total) by mouth 3 (three) times daily as needed for anxiety. Patient taking differently: Take 0.5 mg by mouth 3 (three) times daily. 05/20/21  Yes Barton Dubois, MD  diltiazem (CARDIZEM CD) 120 MG 24 hr capsule Take 120 mg by mouth daily. 05/30/20  Yes [provider]  DULoxetine (CYMBALTA) 60 MG capsule Take 1 capsule by mouth daily. 08/26/21  Yes [provider]  GOODSENSE ARTIFICIAL TEARS 0.5-0.6 % SOLN Apply 1 drop to eye 3 (three) times daily. 06/03/21  Yes [provider]  HYDROcodone-acetaminophen (NORCO) 10-325 MG tablet Take 1 tablet by mouth every 8 (eight) hours as needed for severe pain. Patient taking differently: Take 1 tablet by mouth in the morning, at noon, and at bedtime. 05/20/21  Yes Barton Dubois, MD  insulin detemir (LEVEMIR FLEXTOUCH) 100 UNIT/ML FlexPen INJECT 22 UNITS SUBCUTANEOUSLY AT BEDTIME.(HOLD IF BS<70: CALL MD IF BS> 400) Patient taking differently: 22 Units at bedtime. 07/15/21  Yes Reardon, Juanetta Beets, NP  latanoprost (XALATAN) 0.005 % ophthalmic solution Place 1 drop into both eyes at bedtime.   Yes [provider]  loratadine (CLARITIN) 10 MG tablet Take 10 mg by mouth daily.   Yes [provider]  lubiprostone (AMITIZA) 8 MCG capsule Take 1 capsule by mouth daily. 08/10/21  Yes [provider]  metoCLOPramide (REGLAN) 5 MG tablet Take 5 mg by mouth in the morning, at noon, and at bedtime. If zofran is not working   Yes [provider]  metoprolol succinate (TOPROL-XL) 50 MG 24 hr tablet Take 1 tablet (50 mg total) by mouth daily. Take with or immediately following a meal. 04/01/21  Yes Tat, Shanon Brow, MD  midodrine (PROAMATINE) 10 MG tablet Take 10 mg by mouth 3 (three) times daily.   Yes [provider]   mirtazapine (REMERON) 30 MG tablet Take 30 mg by mouth at bedtime.   Yes [provider]  naloxegol oxalate (MOVANTIK) 12.5 MG TABS tablet Take 1 tablet (12.5 mg total) by mouth daily. 04/24/21  Yes Barton Dubois, MD  NOVOLOG FLEXPEN 100 UNIT/ML FlexPen INJECT SUBCUTANEOUSLY AS FOLLOWS WITH MEALS: 90-150=10u: 151-200=11u: 201-250=12u: 251-300=13u: 301-350=14u: 351-400=15u: BS>400=16u & CALL MD. Patient taking differently: 10-16 Units 3 (three) times daily with meals. INJECT SUBCUTANEOUSLY AS FOLLOWS WITH MEALS: 90-150=10u: 151-200=11u: 201-250=12u: 251-300=13u: 301-350=14u: 351-400=15u: BS>400=16u & CALL MD. 02/28/21  Yes Reardon, Juanetta Beets, NP  ondansetron (ZOFRAN) 4 MG tablet Take 4 mg by mouth 2 (two) times daily. 06/24/21  Yes [provider]  oxybutynin (DITROPAN-XL) 5 MG 24 hr tablet Take 5 mg by mouth daily. 04/20/21  Yes  [provider]  pantoprazole (PROTONIX) 40 MG tablet Take 1 tablet (40 mg total) by mouth 2 (two) times daily before a meal. 11/17/20  Yes Mahala Menghini, PA-C  polyethylene glycol (MIRALAX / GLYCOLAX) 17 g packet Take 17 g by mouth daily. 04/24/21  Yes Barton Dubois, MD  risperiDONE (RISPERDAL) 1 MG tablet Take 1-2 mg by mouth 2 (two) times daily. Take 1 tablet by mouth in the morning. Take 2 tablets by mouth at bedtime. 04/04/21  Yes [provider]  SANTYL 250 UNIT/GM ointment Apply 1 Application topically daily. 07/05/21  Yes [provider]  senna (SENOKOT) 8.6 MG tablet Take 1 tablet by mouth daily.   Yes [provider]  Skin Protectants, Misc. (BAZA PROTECT EX) Apply 1 Application topically in the morning and at bedtime. 08/02/21  Yes [provider]  TRADJENTA 5 MG TABS tablet TAKE (1) TABLET BY MOUTH ONCE DAILY. Patient taking differently: Take 5 mg by mouth daily. 04/04/21  Yes Reardon, Juanetta Beets, NP  EASYMAX TEST test strip USE TO CHECK BLOOD SUGAR FOUR TIMES DAILY.(HOLD IF BS<70: CALL MD IF BS> 400) 07/21/21    Brita Romp, NP  Lancets Thin MISC USE TO CHECK BLOOD SUGAR FOUR TIMES DAILY.(HOLD IF BS<70: CALL MD IF BS> 400) 09/13/21   Brita Romp, NP    Inpatient Medications: Scheduled Meds:  amiodarone  200 mg Oral Daily   ARIPiprazole  2 mg Oral Daily   busPIRone  7.5 mg Oral BID   Chlorhexidine Gluconate Cloth  6 each Topical Q0600   clonazePAM  0.5 mg Oral TID   DULoxetine  60 mg Oral Daily   enoxaparin (LOVENOX) injection  60 mg Subcutaneous Q12H   furosemide  40 mg Intravenous BID   insulin aspart  0-9 Units Subcutaneous Q4H   insulin detemir  10 Units Subcutaneous QHS   metoprolol succinate  50 mg Oral Daily   midodrine  10 mg Oral TID WC   mupirocin ointment   Nasal BID   pantoprazole (PROTONIX) IV  40 mg Intravenous Q12H   potassium chloride  40 mEq Oral BID   Continuous Infusions:  ceFEPime (MAXIPIME) IV 2 g (09/16/21 0539)   dexmedetomidine (PRECEDEX) IV infusion Stopped (09/15/21 1507)   doxycycline (VIBRAMYCIN) IV 100 mg (09/16/21 0306)   norepinephrine (LEVOPHED) Adult infusion Stopped (09/15/21 1307)   vancomycin 1,000 mg (09/15/21 2135)   PRN Meds: acetaminophen **OR** acetaminophen, mouth rinse, prochlorperazine  Allergies:    Allergies  Allergen Reactions   Sulfa Antibiotics Rash    Social History:   Social History   Socioeconomic History   Marital status: Widowed    Spouse name: Not on file   Number of children: Not on file   Years of education: Not on file   Highest education level: Not on file  Occupational History   Not on file  Tobacco Use   Smoking status: Former    Packs/day: 0.25    Years: 20.00    Total pack years: 5.00    Types: Cigarettes    Quit date: 01/24/1995    Years since quitting: 26.6   Smokeless tobacco: Never  Vaping Use   Vaping Use: Never used  Substance and Sexual Activity   Alcohol use: No    Alcohol/week: 0.0 standard drinks of alcohol   Drug use: No   Sexual activity: Never    Birth control/protection:  Post-menopausal  Other Topics Concern   Not on file  Social History Narrative  Not on file   Social Determinants of Health   Financial Resource Strain: Not on file  Food Insecurity: Not on file  Transportation Needs: Not on file  Physical Activity: Not on file  Stress: Not on file  Social Connections: Not on file  Intimate Partner Violence: Not on file    Family History:    Family History  Problem Relation Age of Onset   Hypertension Mother    Colon cancer Neg Hx      ROS:  Please see the history of present illness.  All other ROS reviewed and negative.     Physical Exam/Data:   Vitals:   09/15/21 2000 09/15/21 2315 09/16/21 0334 09/16/21 0548  BP: (!) 131/113     Pulse: 92     Resp: (!) 22     Temp: 98.4 F (36.9 C) 98.7 F (37.1 C) 99 F (37.2 C)   TempSrc:  Oral Bladder   SpO2: 100%   99%  Weight:   57.4 kg   Height:        Intake/Output Summary (Last 24 hours) at 09/16/2021 0838 Last data filed at 09/16/2021 0300 Gross per 24 hour  Intake 1506.55 ml  Output 1900 ml  Net -393.45 ml      09/16/2021    3:34 AM 09/15/2021    3:14 AM 09/14/2021    4:49 AM  Last 3 Weights  Weight (lbs) 126 lb 8.7 oz 128 lb 15.5 oz 146 lb 2.6 oz  Weight (kg) 57.4 kg 58.5 kg 66.3 kg     Body mass index is 19.82 kg/m.  General: Chronically ill frail appearing with tachypnea Head: Normocephalic, atraumatic, sclera non-icteric, no xanthomas, nares are without discharge. Neck: Negative for carotid bruits. JVP not elevated. Lungs: Coarse crackes throughout without rhonchi or wheezing + tachypnea Heart: Irregularly irregular, tachycardic S1 S2 without murmurs, rubs, or gallops.  Abdomen: Soft, non-tender, non-distended with normoactive bowel sounds. No rebound/guarding. Extremities: No clubbing or cyanosis. No edema. Distal pedal pulses are 2+ and equal bilaterally. Neuro: Alert and oriented X 3. Moves all extremities spontaneously. Psych:  Responds to questions appropriately  with a normal affect.   EKG:  The EKG was personally reviewed and demonstrates:  atrial fib 101bpm, nonspecific STTW changes, no acute change from prior Telemetry:  Telemetry was personally reviewed and demonstrates:  atrial fib  Relevant CV Studies: 2d echo 09/14/21     1. Severe asymmetric hypertrophy of the basal septum up to 33m. There is  evidence of SAM of the MV leaflets with obstruction up to 37 mmHG  (variation noted due to afib). Findings are consistent with hypertrophic  obstructive cardiomyopathy. Would  consider a cardiac MRI for better characterization. This was not well  characterized on the last echocardiogram. Left ventricular ejection  fraction, by estimation, is 65 to 70%. The left ventricle has normal  function. The left ventricle has no regional  wall motion abnormalities. There is severe asymmetric left ventricular  hypertrophy of the basal-septal segment. Left ventricular diastolic  function could not be evaluated.   2. Right ventricular systolic function is normal. The right ventricular  size is normal.   3. Left atrial size was mildly dilated.   4. Right atrial size was mildly dilated.   5. The mitral valve is degenerative. Trivial mitral valve regurgitation.  No evidence of mitral stenosis.   6. The aortic valve is tricuspid. There is mild calcification of the  aortic valve. There is mild thickening of the aortic valve.  Aortic valve  regurgitation is not visualized. Aortic valve sclerosis/calcification is  present, without any evidence of  aortic stenosis.   Laboratory Data:  High Sensitivity Troponin:   Recent Labs  Lab 09/13/21 2158 09/13/21 2301  TROPONINIHS 5 6     Chemistry Recent Labs  Lab 09/14/21 0310 09/15/21 0253 09/16/21 0553  NA 135 133* 135  K 4.0 2.9* 3.8  CL 103 97* 99  CO2 '25 27 29  '$ GLUCOSE 196* 153* 157*  BUN '15 11 13  '$ CREATININE 0.79 0.67 0.63  CALCIUM 7.7* 7.9* 8.4*  MG 1.8 1.9 2.0  GFRNONAA >60 >60 >60  ANIONGAP  '7 9 7    '$ Recent Labs  Lab 09/13/21 2121 09/14/21 0310  PROT 7.5 6.0*  ALBUMIN 3.5 2.8*  AST 23 19  ALT 19 16  ALKPHOS 84 64  BILITOT 0.6 1.0   Lipids No results for input(s): "CHOL", "TRIG", "HDL", "LABVLDL", "LDLCALC", "CHOLHDL" in the last 168 hours.  Hematology Recent Labs  Lab 09/14/21 0310 09/15/21 0253 09/16/21 0553  WBC 15.7* 16.6* 13.5*  RBC 3.39* 3.73* 4.06  HGB 9.4* 10.3* 11.2*  HCT 29.5* 31.5* 34.2*  MCV 87.0 84.5 84.2  MCH 27.7 27.6 27.6  MCHC 31.9 32.7 32.7  RDW 15.1 14.7 14.4  PLT 216 231 251   Thyroid  Recent Labs  Lab 09/14/21 0310  TSH 1.091    BNP Recent Labs  Lab 09/13/21 2158  BNP 229.0*    DDimer No results for input(s): "DDIMER" in the last 168 hours.   Radiology/Studies:  [place] EKG SITE RITE  Result Date: 09/15/2021 If [facility] image not attached, placement could not be confirmed due to current cardiac rhythm.  ECHOCARDIOGRAM COMPLETE  Result Date: 09/14/2021    ECHOCARDIOGRAM REPORT   Patient Name:   [name] Date of Exam: 09/14/2021 Medical Rec #:  [id number]       Height:       67.0 in Accession #:    [id number]      Weight:       146.2 lb Date of Birth:  [birth year]       BSA:          1.770 m Patient Age:    12 years        BP:           129/67 mmHg Patient Gender: F               HR:           84 bpm. Exam Location:  [facility] Procedure: 2D Echo, Cardiac Doppler and Color Doppler Indications:    CHF  History:        Patient has prior history of Echocardiogram examinations, most                 recent 10/05/2019. CHF, Stroke, Arrythmias:Atrial Fibrillation,                 Signs/Symptoms:Altered Mental Status; Risk Factors:Hypertension,                 Diabetes and Former Smoker.  Sonographer:    Wenda Low Referring Phys: [phone] DAVID TAT  Sonographer Comments: Echo performed with patient supine and on artificial respirator. IMPRESSIONS  1. Severe asymmetric hypertrophy of the basal septum up to 28m. There is evidence of SAM of  the MV leaflets with obstruction up to 37 mmHG (variation noted due to afib). 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 Would  consider a cardiac MRI for better characterization. This was not well characterized on the last echocardiogram. Left ventricular ejection fraction, by estimation, is 65 to 70%. The left ventricle has normal function. The left ventricle has no regional wall motion abnormalities. There is severe asymmetric left ventricular hypertrophy of the basal-septal segment. Left ventricular diastolic function could not be evaluated.  2. Right ventricular systolic function is normal. The right ventricular size is normal.  3. Left atrial size was mildly dilated.  4. Right atrial size was mildly dilated.  5. The mitral valve is degenerative. Trivial mitral valve regurgitation. No evidence of mitral stenosis.  6. The aortic valve is tricuspid. There is mild calcification of the aortic valve. There is mild thickening of the aortic valve. Aortic valve regurgitation is not visualized. Aortic valve sclerosis/calcification is present, without any evidence of aortic stenosis. FINDINGS  Left Ventricle: Severe asymmetric hypertrophy of the basal septum up to 106m. There is evidence of SAM of the MV leaflets with obstruction up to 37 mmHG (variation noted due to afib). 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  Would consider a cardiac MRI for better characterization. This was not well characterized on the last echocardiogram. Left ventricular ejection fraction, by estimation, is 65 to 70%. The left ventricle has normal function. The left ventricle has no regional wall motion abnormalities. The left ventricular internal cavity size was normal in size. There is severe asymmetric left ventricular hypertrophy of the basal-septal segment. Left ventricular diastolic function could not be evaluated due to atrial fibrillation. Left ventricular diastolic function could  not be evaluated. Right Ventricle: The right ventricular size is normal. No increase in right ventricular wall thickness. Right ventricular systolic function is normal. Left Atrium: Left atrial size was mildly dilated. Right Atrium: Right atrial size was mildly dilated. Pericardium: Trivial pericardial effusion is present. Presence of epicardial fat layer. Mitral Valve: The mitral valve is degenerative in appearance. Trivial mitral valve regurgitation. No evidence of mitral valve stenosis. MV peak gradient, 7.3 mmHg. The mean mitral valve gradient is 2.0 mmHg. Tricuspid Valve: The tricuspid valve is grossly normal. Tricuspid valve regurgitation is mild . No evidence of tricuspid stenosis. Aortic Valve: The aortic valve is tricuspid. There is mild calcification of the aortic valve. There is mild thickening of the aortic valve. Aortic valve regurgitation is not visualized. Aortic valve sclerosis/calcification is present, without any evidence of aortic stenosis. Aortic valve mean gradient measures 7.5 mmHg. Aortic valve peak gradient measures 16.2 mmHg. Aortic valve area, by VTI measures 2.37 cm. Pulmonic Valve: The pulmonic valve was grossly normal. Pulmonic valve regurgitation is not visualized. No evidence of pulmonic stenosis. Aorta: The aortic root and ascending aorta are structurally normal, with no evidence of dilitation. Venous: IVC assessment for right atrial pressure unable to be performed due to mechanical ventilation. IAS/Shunts: The atrial septum is grossly normal.  LEFT VENTRICLE PLAX 2D LVIDd:         3.80 cm LVIDs:         1.80 cm LV PW:         1.30 cm LV IVS:        1.70 cm LVOT diam:     2.00 cm LV SV:         84 LV SV Index:   48 LVOT Area:     3.14 cm  RIGHT VENTRICLE RV Basal diam:  3.70 cm RV Mid diam:    3.10 cm RV S prime:     12.80 cm/s LEFT ATRIUM  Index        RIGHT ATRIUM           Index LA diam:        4.50 cm 2.54 cm/m   RA Area:     18.10 cm LA Vol (A2C):   62.5 ml 35.31  ml/m  RA Volume:   53.70 ml  30.34 ml/m LA Vol (A4C):   62.0 ml 35.03 ml/m LA Biplane Vol: 62.6 ml 35.37 ml/m  AORTIC VALVE                     PULMONIC VALVE AV Area (Vmax):    2.31 cm      PV Vmax:       0.87 m/s AV Area (Vmean):   2.40 cm      PV Peak grad:  3.0 mmHg AV Area (VTI):     2.37 cm AV Vmax:           201.50 cm/s AV Vmean:          123.200 cm/s AV VTI:            0.355 m AV Peak Grad:      16.2 mmHg AV Mean Grad:      7.5 mmHg LVOT Vmax:         148.00 cm/s LVOT Vmean:        94.200 cm/s LVOT VTI:          0.268 m LVOT/AV VTI ratio: 0.75  AORTA Ao Root diam: 3.70 cm Ao Asc diam:  3.40 cm MITRAL VALVE                TRICUSPID VALVE MV Area (PHT): 3.95 cm     TR Peak grad:   35.0 mmHg MV Area VTI:   3.43 cm     TR Vmax:        296.00 cm/s MV Peak grad:  7.3 mmHg MV Mean grad:  2.0 mmHg     SHUNTS MV Vmax:       1.35 m/s     Systemic VTI:  0.27 m MV Vmean:      64.5 cm/s    Systemic Diam: 2.00 cm MV Decel Time: 192 msec MV E velocity: 118.00 cm/s Eleonore Chiquito MD Electronically signed by Eleonore Chiquito MD Signature Date/Time: 09/14/2021/3:29:21 PM    Final    CT Head Wo Contrast  Result Date: 09/13/2021 CLINICAL DATA:  Headache, new or worsening. EXAM: CT HEAD WITHOUT CONTRAST TECHNIQUE: Contiguous axial images were obtained from the base of the skull through the vertex without intravenous contrast. RADIATION DOSE REDUCTION: This exam was performed according to the departmental dose-optimization program which includes automated exposure control, adjustment of the mA and/or kV according to patient size and/or use of iterative reconstruction technique. COMPARISON:  CT examination dated August 04, 2021 FINDINGS: Brain: No evidence of acute infarction, hemorrhage, hydrocephalus, extra-axial collection or mass lesion/mass effect. Low-attenuation of the periventricular and subcortical white matter presumed advanced chronic microvascular ischemic changes. Vascular: No hyperdense vessel or unexpected  calcification. Skull: Normal. Negative for fracture or focal lesion. Sinuses/Orbits: Paranasal sinus postsurgical changes. Other: None. IMPRESSION: 1. No acute intracranial abnormality. 2. Advanced chronic microvascular ischemic changes of the supratentorial white matter. Electronically Signed   By: Keane Police D.O.   On: 09/13/2021 23:38   DG Chest Port 1 View  Result Date: 09/13/2021 CLINICAL DATA:  Hypoxia EXAM: PORTABLE CHEST 1 VIEW COMPARISON:  08/29/2021 FINDINGS: Cardiomegaly. Interstitial prominence throughout  the lungs, right greater than left. This could reflect interstitial edema or atypical infection. NG tube is in the stomach. Endotracheal tube is 3 cm above the carina. No effusions or acute bony abnormality. IMPRESSION: Cardiomegaly. Increasing interstitial prominence, right greater than left, favor interstitial edema although atypical infection cannot be excluded. Electronically Signed   By: Rolm Baptise M.D.   On: 09/13/2021 22:11     Assessment and Plan:   1. Septic shock  - multiple medical issues going on along with complex hx recently  2. Acute respiratory failure with hypoxia - extubated but now with continued tachypnea and WOB, did not tolerate bipap - PCCM on board as well  3. Persistent atrial fib with RVR, also hx of atrial flutter - has been an ongoing issue since earlier this year in the context of multiple physiologic stressors - not a candidate for TEE/DCCV at this juncture with critical illness  - favor rate control strategy, will review with MD as we are limited by hypotension - currently on amio '200mg'$  daily, Toprol '50mg'$  daily, with home diltiazem '120mg'$  on hold - echo has evidence of HOCM so BB may be the better strategy than CCB  4. Possible acute diastolic CHF with evidence of HOCM on echocardiogram - will review findings and rx with cardiologist   Remainder of medical issues per IM.    Risk Assessment/Risk Scores:        New York Heart Association  (NYHA) Functional Class NYHA Class IV  CHA2DS2-VASc Score = 8   This indicates a 10.8% annual risk of stroke. The patient's score is based upon: CHF History: 0 HTN History: 1 Diabetes History: 1 Stroke History: 2 Vascular Disease History: 1 Age Score: 2 Gender Score: 1      For questions or updates, please contact [place] Please consult [email or web address] for contact info under    Signed, Charlie Pitter, PA-C  09/16/2021 8:38 AM

## 2021-09-16 NOTE — TOC Initial Note (Signed)
Transition of Care [facility]) - Initial/Assessment Note    Patient Details  Name: [name] MRN: [id number] Date of Birth: 09/01/1945  [facility]) CM/SW Contact:    Boneta Lucks, RN Phone Number: 09/16/2021, 2:04 PM  Clinical Narrative:   Patient admitted with septic shock. Patient extubated and doing better. Patient is now a DNR, Patient is from [facility]. PT recommended HHPT. TOC updated [place], the plan is to discharge her back to ALF on Monday. They use [facility] for HHPT. Referral sent to [facility]. TOC to follow.                 Expected Discharge Plan: Assisted Living Barriers to Discharge: Continued Medical Work up   Patient Goals and CMS Choice Patient states their goals for this hospitalization and ongoing recovery are:: to return to ALF CMS Medicare.gov Compare Post Acute Care list provided to:: Patient Choice offered to / list presented to : Patient  Expected Discharge Plan and Services Expected Discharge Plan: Assisted Living       Living arrangements for the past 2 months: Assisted Living Facility                    Prior Living Arrangements/Services Living arrangements for the past 2 months: [place] Lives with:: Domestic Partner          Activities of Daily Living    Permission Sought/Granted    Emotional Assessment      Alcohol / Substance Use: Not Applicable Psych Involvement: No (comment)  Admission diagnosis:  Lactic acidosis [E87.20] Bacterial urinary infection [N39.0, A49.9] Septic shock ([place]) [A41.9, R65.21] Hypotension, unspecified hypotension type [I95.9] Altered mental status, unspecified altered mental status type [R41.82] Sepsis, due to unspecified organism, unspecified whether acute organ dysfunction present [facility]) [A41.9] Patient Active Problem List   Diagnosis Date Noted   Pressure injury of skin 09/16/2021   Goals of care, counseling/discussion 09/16/2021   IV infiltration 98/92/1194   Acute  metabolic encephalopathy 17/40/8144   UTI (urinary tract infection) 81/85/6314   Acute diastolic CHF (congestive heart failure) ([place]) 09/14/2021   Septic shock ([place]) 09/13/2021   Pneumonia 08/14/2021   PNA (pneumonia) 08/13/2021   Opioid dependence ([place]) 07/24/2021   Prolonged QT interval 07/22/2021   Mood disorder ([place]) 05/18/2021   Fecal impaction ([place]) 04/23/2021   Acute respiratory failure with hypoxia ([place]) 03/22/2021   Paroxysmal atrial fibrillation ([place]) 03/22/2021   Hyperthyroidism    SBO (small bowel obstruction) ([place]) 04/18/2020   Atrial fibrillation with RVR ([place]) 10/04/2019   GERD (gastroesophageal reflux disease) 08/26/2019   Constipation 09/09/2018   Hypotensive episode 03/21/2018   Primary osteoarthritis of right hip 04/18/2017   History of cerebrovascular accident (CVA) with residual deficit 03/28/2017   Schizophrenia ([place]) 03/28/2017   Generalized weakness 03/28/2017   IBS (irritable bowel syndrome) 10/16/2016   Left hemiparesis ([place]) 10/06/2016   Chronic superficial gastritis without bleeding    Stricture and stenosis of esophagus    IDA (iron deficiency anemia) 04/06/2015   Sedentary lifestyle 11/06/2014   Uncontrolled type 2 diabetes mellitus with hyperglycemia, with long-term current use of insulin ([place]) 03/24/2010   Essential hypertension 03/24/2010   DVT, lower extremity, recurrent ([place]) 03/24/2010   PCP:  Carrolyn Meiers, MD Pharmacy:   [facility], [place] [address] [place] [address] Phone: [phone] Fax: [phone]   Readmission Risk Interventions    09/16/2021    2:02 PM 03/29/2021   10:51  AM 03/23/2021    2:15 PM  Readmission Risk Prevention Plan  Transportation Screening Complete Complete Complete  HRI or Home Care Consult   Complete  Social Work Consult for [place] Planning/Counseling   Complete  Palliative Care Screening   Not Applicable  Medication Review Press photographer) Complete   Complete  HRI or Home Care Consult Complete Complete   SW Recovery Care/Counseling Consult  Complete   Palliative Care Screening Not Applicable Not Applicable   Skilled Nursing Facility Not Applicable Complete

## 2021-09-16 NOTE — Progress Notes (Addendum)
Patient continues to get increasingly agitated and no orders from MD have been received at this time. Placing patient back on nasal cannula as she is trying to remove mask and stating she cannot breath with mask on. Patient continues to ask to go outside but said thank you for taking mask off. Will continue to monitor.

## 2021-09-16 NOTE — Evaluation (Signed)
Clinical/Bedside Swallow Evaluation Patient Details  Name: [name] MRN: [id number] Date of Birth: Jan 07, 1946  Today's Date: 09/16/2021 Time: SLP Start Time (ACUTE ONLY): 93 SLP Stop Time (ACUTE ONLY): 4193 SLP Time Calculation (min) (ACUTE ONLY): 17 min  Past Medical History:  Past Medical History:  Diagnosis Date   Anxiety    Arthritis    Atrial fibrillation (HCC)    Atrial flutter ([place])    Collagen vascular disease ([place])    COVID-19 virus infection    COVID-19+ approx 01/24/19; asymptomatic course with full recovery   Dependence on wheelchair    pivot/transfers   Depression    History of psychosis and previous suicide attempt   DVT, lower extremity, recurrent ([place])    Long-term Coumadin per Dr. Legrand Rams   Essential hypertension    GERD (gastroesophageal reflux disease)    Hemiplegia ([place]) 2010   Left side   History of stroke    Acute infarct and right cerebral white matter small vessel disease 12/10   Leg DVT (deep venous thromboembolism), acute ([place]) 2006   Orthostatic hypotension    Schizophrenia ([place])    Stroke ([place])    left sided weakness   Type 2 diabetes mellitus ([place])    Past Surgical History:  Past Surgical History:  Procedure Laterality Date   BACK SURGERY     BIOPSY N/A 11/24/2014   Procedure: BIOPSY;  Surgeon: Danie Binder, MD;  Location: [facility];  Service: Endoscopy;  Laterality: N/A;   BIOPSY  05/17/2016   Procedure: BIOPSY;  Surgeon: Danie Binder, MD;  Location: [facility];  Service: Endoscopy;;  gastric biopsy   COLONOSCOPY WITH PROPOFOL N/A 11/24/2014   Dr. Rudie Meyer polyps removed/moderate sized internal hemorrhoids, tubular adenomas. Next surveillance in 3 years   ESOPHAGEAL DILATION N/A 07/23/2021   Procedure: ESOPHAGEAL DILATION;  Surgeon: Eloise Harman, DO;  Location: [facility];  Service: Endoscopy;  Laterality: N/A;   ESOPHAGOGASTRODUODENOSCOPY (EGD) WITH PROPOFOL N/A 11/24/2014   Dr. Clayburn Pert HH/patent stricture at the  gastroesophageal junction, mild non-erosive gastritis, path negative for H.pylori or celiac sprue   ESOPHAGOGASTRODUODENOSCOPY (EGD) WITH PROPOFOL N/A 05/17/2016   Procedure: ESOPHAGOGASTRODUODENOSCOPY (EGD) WITH PROPOFOL;  Surgeon: Danie Binder, MD;  [facility];  Service: Endoscopy;  Laterality: N/A;  12:45pm   ESOPHAGOGASTRODUODENOSCOPY (EGD) WITH PROPOFOL N/A 07/23/2021   Procedure: ESOPHAGOGASTRODUODENOSCOPY (EGD) WITH PROPOFOL;  Surgeon: Eloise Harman, DO;  [facility];  Service: Endoscopy;  Laterality: N/A;   GIVENS CAPSULE STUDY N/A 12/11/2014   MULTILPLE EROSION IN the stomach WITH ACTIVE OOZING. OCCASIONAL EROSIONS AND RARE ULCER SEEN IN PROXIMAL SMALL BOWEL . No masses or AVMs SEEN. NO OLD BLOOD OR FRESH BLOOD SEEN.    POLYPECTOMY N/A 11/24/2014   Procedure: POLYPECTOMY;  Surgeon: Danie Binder, MD;  Location: [facility];  Service: Endoscopy;  Laterality: N/A;   SAVORY DILATION N/A 05/17/2016   Procedure: SAVORY DILATION;  Surgeon: Danie Binder, MD;  [facility];  Service: Endoscopy;  Laterality: N/A;   HPI:  [name] is a 76 y.o. female with medical history significant of anxiety, atrial fibrillation, wheelchair dependence, hypertension, GERD, DVT in 2006, type 2 diabetes mellitus, and more presents to ED with a chief complaint of fever.  Unfortunately patient is not able to provide any history at all as she is on the vent and sedated.  She was brought in by facility staff that reported she was less responsive and having difficulty breathing.  Also noted her to have a fever.  In the ED patient was not protecting her airway and was quite hypoxic despite nasal cannula.  She was intubated.  Work-up revealed a UTI and interstitial edema versus atypical infection on chest x-ray.  CT head did not show any abnormality.  Patient had 2 L bolus, and continued to be hypotensive so she was started on Levophed.  Admission requested for septic shock.     Assessment / Plan / Recommendation  Clinical Impression  Clinical swallowing evaluation completed while Pt was sitting upright in bed; note esophageal dysphagia hx. Pt consumed thin liquids, pudding and puree textures without overt s/sx of aspiration. Pt has upper dentures at bedside but reports she does not want to put them in. She reports she is unable to [facility] regular trial presented; she reports even with dentures she is unable to masticate regular and most solid textures. BSE last month completed recommending D3/mech soft and thin liquid diet (at that time she tolerated regular trials without incident). Today, secondary to Pt refusing to attempt to masticate solids recommend initiate D1/puree diet with thin liquids. Defer upgrade to staff at [facility] facility as they know what she can tolerate at baseline. In acute will initiate D1/thin diet; recommend meds whole with puree. There are no further ST needs noted at this time, ST will sign off. SLP Visit Diagnosis: Dysphagia, unspecified (R13.10)    Aspiration Risk  Mild aspiration risk    Diet Recommendation Dysphagia 1 (Puree);Thin liquid   Liquid Administration via: Cup;Straw Medication Administration: Whole meds with puree Supervision: Staff to assist with self feeding;Full supervision/cueing for compensatory strategies Compensations: Minimize environmental distractions;Slow rate    Other  Recommendations Oral Care Recommendations: Oral care BID    Recommendations for follow up therapy are one component of a multi-disciplinary discharge planning process, led by the attending physician.  Recommendations may be updated based on patient status, additional functional criteria and insurance authorization.  Follow up Recommendations No SLP follow up        [place] Date of Onset: 09/14/21 HPI: [name] is a 76 y.o. female with medical history significant of anxiety, atrial fibrillation, wheelchair dependence,  hypertension, GERD, DVT in 2006, type 2 diabetes mellitus, and more presents to ED with a chief complaint of fever.  Unfortunately patient is not able to provide any history at all as she is on the vent and sedated.  She was brought in by facility staff that reported she was less responsive and having difficulty breathing.  Also noted her to have a fever.  In the ED patient was not protecting her airway and was quite hypoxic despite nasal cannula.  She was intubated.  Work-up revealed a UTI and interstitial edema versus atypical infection on chest x-ray.  CT head did not show any abnormality.  Patient had 2 L bolus, and continued to be hypotensive so she was started on Levophed.  Admission requested for septic shock. Type of Study: Bedside Swallow Evaluation Previous Swallow Assessment: BSE 7/23 Diet Prior to this Study: Thin liquids Temperature Spikes Noted: No Respiratory Status: Room air History of Recent Intubation: Yes Behavior/Cognition: Alert;Cooperative Oral Cavity Assessment: Within Functional Limits Oral Care Completed by SLP: Recent completion by staff Oral Cavity - Dentition: Dentures, top;Edentulous Self-Feeding Abilities: Able to feed self Patient Positioning: Upright in bed Baseline Vocal Quality: Normal Volitional Cough: Strong Volitional Swallow: Able to elicit    Oral/Motor/Sensory Function Overall Oral Motor/Sensory Function: Within functional limits  Ice Chips Ice chips: Within functional limits   Thin Liquid Thin Liquid: Within functional limits    Nectar Thick Nectar Thick Liquid: Not tested   Honey Thick Honey Thick Liquid: Not tested   Puree Puree: Within functional limits   Solid     Solid: Not tested     [name], CCC-SLP Speech Language Pathologist  Wende Bushy 09/16/2021,3:57 PM

## 2021-09-16 NOTE — Assessment & Plan Note (Signed)
Stage 1 on buttock and left hip Present on admission Local care

## 2021-09-16 NOTE — Progress Notes (Signed)
PROGRESS NOTE  ZITLALY MALSON Bauer:[id number] DOB: 02-Jan-1946 DOA: 09/13/2021 PCP: Carrolyn Meiers, MD  Brief History:  76 year old female with a history of schizophrenia, hypertension, diabetes mellitus type 2, DVT, anxiety, and atrial fibrillation presenting with altered mental status and hypoxia.  The patient normally resides at [facility]. Unfortunately patient is not able to provide any history at all as she is on the vent and sedated.  She was brought in by facility staff that reported she was less responsive and having difficulty breathing.  Also noted her to have a fever.  In the ED patient was not protecting her airway and was quite hypoxic despite nasal cannula.  She was intubated.  Work-up revealed a UTI and interstitial edema versus atypical infection on chest x-ray.  CT head did not show any abnormality.  Patient had 2 L bolus, and continued to be hypotensive so she was started on Levophed.  Admission requested for septic shock. PCCM consulted to assist with management. Echo showed EF 65-70% with HOCM.  Cardiology was consulted to assist with management.     Assessment and Plan: * Septic shock (HCC) - Fever 100.6, tachycardia 116, respiratory rate 25, blood pressure 70/43, acute respiratory failure with an oxygen sat of 78%, leukocytosis 17, lactic acidosis 2.3 - Secondary to UTI  - Continue IV antibiotics - Patient is requiring 7 mcg of Levophed>>weaned off 8/24 afternoon - This is after receiving her 30 mL/kg bolus - Continue cefepime and doxycycline   Schizophrenia (HCC) Continue  Risperdal, Remeron, Cymbalta--restarted Continue clonazepam--restarted  Uncontrolled type 2 diabetes mellitus with hyperglycemia, with long-term current use of insulin (HCC) Continue Levemir 10 units hx Continue NovoLog sliding scale 03/22/2021 hemoglobin A1c 7.1 08/19/21 A1C--6.2  GERD (gastroesophageal reflux disease) - Continue Protonix  Acute respiratory failure  with hypoxia (HCC) - Secondary to pulmonary edema - Chest x-ray shows interstitial pulmonary edema versus atypical infection- patient was discharged July 23 after admission from pneumonia, those infiltrates should be resolved by now - trach aspirate culture S.pneumoniae -appreciate PCCM--discussed with Dr. Elsworth Soho --extubated 09/15/21 afternoon --MRSA screen is positive --follow trach aspirate culture  Acute diastolic CHF (congestive heart failure) ([place]) Continue IV lasix Remains clinically fluid overloaded 10/15/19 Echo EF 60-65%, no WMA, mild MR Repeat Echo EF 65-70%, +HOCM, no WMA Accurate I/O--5.7L out last 48 hours Cardiology consulted  UTI (urinary tract infection) - Cefepime-continue -UA >50 WBC - Urine culture Ecoli - Blood culture neg to date  DVT, lower extremity, recurrent (HCC) On [place] lovenox currently>>plan to transition back to apixaban ultimately  Stricture and stenosis of esophagus 07/23/2021 EGD--showed food in the middle third and lower third of the esophagus.  The patient was subsequently debated during the procedure secondary to aspiration risk.  90% of the retained food was removed with 10% pushed into the stomach.  A distal esophageal stricture was noted. -Continue full liquid diet once extubated -Continues to have dysphagia -07/25/21 barium swallow>>Nonspecific esophageal dysmotility disorder, with termination of primary peristaltic waves in the mid esophagus, and tertiary contractions in the esophagus Continue IV pantoprazole twice daily  Opioid dependence ([place]) PDMP reviewed Norco 10/325, #90, last refilled on 09/01/21, 08/03/21 Clonazepam 0.5 mg, #90, last filled on 09/08/21, 08/26/21  Essential hypertension Holding dilitazem and metoprolol succinate due to hypotension initially --restart metoprolol succinate now that BPs improving  Goals of care, counseling/discussion Discussed with patient's Legal Guardian, [name] --agrees with DNR  Pressure injury  of skin Stage  1 on buttock and left hip Present on admission Local care  IV infiltration IV with levophed infiltrated in right arm --give phentolamine --monitor closely for tissue necrosis  Acute metabolic encephalopathy - obtunded in ED initially - CT head shows no acute intracranial abnormality - Patient was hypotensive which is likely contributing at 70/43, but also hypoxic as she was not protecting her airway at 78% - intubated in ED to protect airway - Initial pH 7.3, PCO2 33 -  altered mental status secondary to sepsis, hypoxia and hypotension - overall improved   Paroxysmal atrial fibrillation (HCC) Continue lovenox for [facility] with plans to switch back to apixaban Continue amiodarone Holding metoprolol succinate and diltiazem CD due to hypotension initially Having some RVR as expected Restart metoprolol succinate as BP now improving        Family Communication:   daughter updated at bedside 8/23   Consultants:  PCCM   Code Status:  FULL    DVT Prophylaxis:  [place] Lovenox     Procedures: As Listed in Progress Note Above   Antibiotics: Cefepime 8/22>> Doxy 8/22>> Vanc 8/22>>          Subjective: More awake and alert.  Patient denies fevers, chills, headache, chest pain, dyspnea, nausea, vomiting, diarrhea, abdominal pain, dysuria, hematuria, hematochezia.  Complains of back pain   Objective: Vitals:   09/15/21 2000 09/15/21 2315 09/16/21 0334 09/16/21 0548  BP: (!) 131/113     Pulse: 92     Resp: (!) 22     Temp: 98.4 F (36.9 C) 98.7 F (37.1 C) 99 F (37.2 C)   TempSrc:  Oral Bladder   SpO2: 100%   99%  Weight:   57.4 kg   Height:        Intake/Output Summary (Last 24 hours) at 09/16/2021 0837 Last data filed at 09/16/2021 0300 Gross per 24 hour  Intake 1506.55 ml  Output 1900 ml  Net -393.45 ml   Weight change: -1.1 kg Exam:  General:  Pt is alert, follows commands appropriately, not in acute distress HEENT: No icterus, No thrush,  No neck mass, [place]/AT Cardiovascular: IRRR, S1/S2, no rubs, no gallops Respiratory: bibasilar crackles. No wheeze Abdomen: Soft/+BS, non tender, non distended, no guarding Extremities: No edema, No lymphangitis, No petechiae, No rashes, no synovitis   Data Reviewed: I have personally reviewed following labs and imaging studies Basic Metabolic Panel: Recent Labs  Lab 09/13/21 2121 09/14/21 0310 09/15/21 0253 09/16/21 0553  NA 134* 135 133* 135  K 4.1 4.0 2.9* 3.8  CL 98 103 97* 99  CO2 '25 25 27 29  '$ GLUCOSE 158* 196* 153* 157*  BUN '17 15 11 13  '$ CREATININE 0.99 0.79 0.67 0.63  CALCIUM 8.3* 7.7* 7.9* 8.4*  MG  --  1.8 1.9 2.0  PHOS  --  3.8  --   --    Liver Function Tests: Recent Labs  Lab 09/13/21 2121 09/14/21 0310  AST 23 19  ALT 19 16  ALKPHOS 84 64  BILITOT 0.6 1.0  PROT 7.5 6.0*  ALBUMIN 3.5 2.8*   No results for input(s): "LIPASE", "AMYLASE" in the last 168 hours. No results for input(s): "AMMONIA" in the last 168 hours. Coagulation Profile: Recent Labs  Lab 09/13/21 2121 09/14/21 0310  INR 1.6* 1.6*   CBC: Recent Labs  Lab 09/13/21 2121 09/14/21 0310 09/15/21 0253 09/16/21 0553  WBC 17.1* 15.7* 16.6* 13.5*  NEUTROABS 15.0* 13.6*  --   --   HGB 10.0* 9.4* 10.3*  11.2*  HCT 31.3* 29.5* 31.5* 34.2*  MCV 86.9 87.0 84.5 84.2  PLT 273 216 231 251   Cardiac Enzymes: No results for input(s): "CKTOTAL", "CKMB", "CKMBINDEX", "TROPONINI" in the last 168 hours. BNP: Invalid input(s): "POCBNP" CBG: Recent Labs  Lab 09/15/21 1923 09/15/21 2152 09/15/21 2318 09/16/21 0337 09/16/21 0739  GLUCAP 127* 193* 207* 127* 153*   HbA1C: No results for input(s): "HGBA1C" in the last 72 hours. Urine analysis:    Component Value Date/Time   COLORURINE YELLOW 09/13/2021 2212   APPEARANCEUR CLOUDY (A) 09/13/2021 2212   APPEARANCEUR Cloudy (A) 05/01/2016 1525   LABSPEC 1.017 09/13/2021 2212   PHURINE 6.0 09/13/2021 2212   GLUCOSEU NEGATIVE 09/13/2021 2212    HGBUR NEGATIVE 09/13/2021 2212   BILIRUBINUR NEGATIVE 09/13/2021 2212   BILIRUBINUR Negative 05/01/2016 1525   KETONESUR NEGATIVE 09/13/2021 2212   PROTEINUR 30 (A) 09/13/2021 2212   UROBILINOGEN 0.2 09/14/2014 1703   NITRITE POSITIVE (A) 09/13/2021 2212   LEUKOCYTESUR LARGE (A) 09/13/2021 2212   Sepsis Labs: '@LABRCNTIP'$ (procalcitonin:4,lacticidven:4) ) Recent Results (from the past 240 hour(s))  Resp Panel by RT-PCR (Flu A&B, Covid) Anterior Nasal Swab     Status: None   Collection Time: 09/13/21  9:20 PM   Specimen: Anterior Nasal Swab  Result Value Ref Range Status   SARS Coronavirus 2 by RT PCR NEGATIVE NEGATIVE Final    Comment: (NOTE) SARS-CoV-2 target nucleic acids are NOT DETECTED.  The SARS-CoV-2 RNA is generally detectable in upper respiratory specimens during the acute phase of infection. The lowest concentration of SARS-CoV-2 viral copies this assay can detect is 138 copies/mL. A negative result does not preclude SARS-Cov-2 infection and should not be used as the sole basis for treatment or other patient management decisions. A negative result may occur with  improper specimen collection/handling, submission of specimen other than nasopharyngeal swab, presence of viral mutation(s) within the areas targeted by this assay, and inadequate number of viral copies(<138 copies/mL). A negative result must be combined with clinical observations, patient history, and epidemiological information. The expected result is Negative.  Fact Sheet for Patients:  EntrepreneurPulse.com.au  Fact Sheet for Healthcare Providers:  IncredibleEmployment.be  This test is no t yet approved or cleared by the [place] FDA and  has been authorized for detection and/or diagnosis of SARS-CoV-2 by FDA under an Emergency Use Authorization (EUA). This EUA will remain  in effect (meaning this test can be used) for the duration of the COVID-19 declaration  under Section 564(b)(1) of the Act, 21 U.S.C.section 360bbb-3(b)(1), unless the authorization is terminated  or revoked sooner.       Influenza A by PCR NEGATIVE NEGATIVE Final   Influenza B by PCR NEGATIVE NEGATIVE Final    Comment: (NOTE) The Xpert Xpress SARS-CoV-2/FLU/RSV plus assay is intended as an aid in the diagnosis of influenza from Nasopharyngeal swab specimens and should not be used as a sole basis for treatment. Nasal washings and aspirates are unacceptable for Xpert Xpress SARS-CoV-2/FLU/RSV testing.  Fact Sheet for Patients: EntrepreneurPulse.com.au  Fact Sheet for Healthcare Providers: IncredibleEmployment.be  This test is not yet approved or cleared by the [place] FDA and has been authorized for detection and/or diagnosis of SARS-CoV-2 by FDA under an Emergency Use Authorization (EUA). This EUA will remain in effect (meaning this test can be used) for the duration of the COVID-19 declaration under Section 564(b)(1) of the Act, 21 U.S.C. section 360bbb-3(b)(1), unless the authorization is terminated or revoked.  Performed at [facility], [phone]  [address], [place], [place] [address]   Culture, blood (routine x 2)     Status: None (Preliminary result)   Collection Time: 09/13/21  9:20 PM   Specimen: BLOOD RIGHT HAND  Result Value Ref Range Status   Specimen Description BLOOD RIGHT HAND  Final   Special Requests   Final    BOTTLES DRAWN AEROBIC AND ANAEROBIC Blood Culture results may not be optimal due to an inadequate volume of blood received in culture bottles   Culture   Final    NO GROWTH 3 DAYS Performed at [facility], [address], [place], [place] [address]    Report Status PENDING  Incomplete  Urine Culture     Status: Abnormal (Preliminary result)   Collection Time: 09/13/21 10:12 PM   Specimen: Urine, Clean Catch  Result Value Ref Range Status   Specimen Description   Final    URINE, CLEAN  CATCH Performed at [facility], [address], [place], [place] [address]    Special Requests   Final    NONE Performed at [facility], [address], [place], [place] [address]    Culture >=100,000 COLONIES/mL ESCHERICHIA COLI (A)  Final   Report Status PENDING  Incomplete  Culture, blood (routine x 2)     Status: None (Preliminary result)   Collection Time: 09/13/21 11:01 PM   Specimen: BLOOD  Result Value Ref Range Status   Specimen Description BLOOD  Final   Special Requests NONE  Final   Culture   Final    NO GROWTH 3 DAYS Performed at [facility], [address] [place], [place] [address]    Report Status PENDING  Incomplete  MRSA Next Gen by PCR, Nasal     Status: Abnormal   Collection Time: 09/14/21  1:50 AM   Specimen: Nasal Mucosa; Nasal Swab  Result Value Ref Range Status   MRSA by PCR Next Gen DETECTED (A) NOT DETECTED Final    Comment: RESULT CALLED TO, READ BACK BY AND VERIFIED WITH: SUPPER,B'@0507'$  BY MATTHEWS, B 8.23.23 (NOTE) The GeneXpert MRSA Assay (FDA approved for NASAL specimens only), is one component of a comprehensive MRSA colonization surveillance program. It is not intended to diagnose MRSA infection nor to guide or monitor treatment for MRSA infections. Test performance is not FDA approved in patients less than 72 years old. Performed at [facility], [address] [place], [place] [address]   Expectorated Sputum Assessment w Gram Stain, Rflx to Resp Cult     Status: None (Preliminary result)   Collection Time: 09/14/21  2:48 AM   Specimen: Expectorated Sputum  Result Value Ref Range Status   Specimen Description   Final    EXPECTORATED SPUTUM Performed at [facility], [address], [place], [place] [address]    Special Requests   Final    NONE Performed at [facility], [address], [place], [place] [address]    Sputum evaluation   Final    THIS SPECIMEN IS ACCEPTABLE FOR SPUTUM CULTURE Performed at [facility], [place]  [address], [place], [place] [address]    Report Status PENDING  Incomplete  Culture, Respiratory w Gram Stain     Status: None (Preliminary result)   Collection Time: 09/14/21  2:48 AM  Result Value Ref Range Status   Specimen Description EXPECTORATED SPUTUM  Final   Special Requests NONE Reflexed from [id number]  Final   Gram Stain   Final    MODERATE WBC PRESENT,BOTH PMN AND MONONUCLEAR  FEW GRAM POSITIVE COCCI IN PAIRS RARE GRAM POSITIVE RODS    Culture   Final    ABUNDANT STREPTOCOCCUS PNEUMONIAE CULTURE REINCUBATED FOR BETTER GROWTH SUSCEPTIBILITIES TO FOLLOW Performed at [facility], [place] [address] [place], [place] [address]    Report Status PENDING  Incomplete     Scheduled Meds:  amiodarone  200 mg Oral Daily   ARIPiprazole  2 mg Oral Daily   busPIRone  7.5 mg Oral BID   Chlorhexidine Gluconate Cloth  6 each Topical Q0600   clonazePAM  0.5 mg Oral TID   DULoxetine  60 mg Oral Daily   enoxaparin (LOVENOX) injection  60 mg Subcutaneous Q12H   furosemide  40 mg Intravenous BID   insulin aspart  0-9 Units Subcutaneous Q4H   insulin detemir  10 Units Subcutaneous QHS   metoprolol succinate  50 mg Oral Daily   midodrine  10 mg Oral TID WC   mupirocin ointment   Nasal BID   pantoprazole (PROTONIX) IV  40 mg Intravenous Q12H   potassium chloride  40 mEq Oral BID   Continuous Infusions:  ceFEPime (MAXIPIME) IV 2 g (09/16/21 0539)   dexmedetomidine (PRECEDEX) IV infusion Stopped (09/15/21 1507)   doxycycline (VIBRAMYCIN) IV 100 mg (09/16/21 0306)   norepinephrine (LEVOPHED) Adult infusion Stopped (09/15/21 1307)   vancomycin 1,000 mg (09/15/21 2135)    Procedures/Studies: [place] EKG SITE RITE  Result Date: 09/15/2021 If Site Rite image not attached, placement could not be confirmed due to current cardiac rhythm.  ECHOCARDIOGRAM COMPLETE  Result Date: 09/14/2021    ECHOCARDIOGRAM REPORT   Patient Name:   [name] Date of Exam: 09/14/2021 Medical Rec #:  [id number]        Height:       67.0 in Accession #:    [id number]      Weight:       146.2 lb Date of Birth:  October 26, 1945       BSA:          1.770 m Patient Age:    66 years        BP:           129/67 mmHg Patient Gender: F               HR:           84 bpm. Exam Location:  [facility] Procedure: 2D Echo, Cardiac Doppler and Color Doppler Indications:    CHF  History:        Patient has prior history of Echocardiogram examinations, most                 recent 10/05/2019. CHF, Stroke, Arrythmias:Atrial Fibrillation,                 Signs/Symptoms:Altered Mental Status; Risk Factors:Hypertension,                 Diabetes and Former Smoker.  Sonographer:    Wenda Low Referring Phys: [phone] Jyles Sontag  Sonographer Comments: Echo performed with patient supine and on artificial respirator. IMPRESSIONS  1. Severe asymmetric hypertrophy of the basal septum up to 103m. There is evidence of SAM of the MV leaflets with obstruction up to 37 mmHG (variation noted due to afib). 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 Would consider a cardiac MRI for better characterization. This was not well characterized on the last echocardiogram. Left ventricular ejection fraction, by estimation, is 65 to 70%. The left ventricle  has normal function. The left ventricle has no regional wall motion abnormalities. There is severe asymmetric left ventricular hypertrophy of the basal-septal segment. Left ventricular diastolic function could not be evaluated.  2. Right ventricular systolic function is normal. The right ventricular size is normal.  3. Left atrial size was mildly dilated.  4. Right atrial size was mildly dilated.  5. The mitral valve is degenerative. Trivial mitral valve regurgitation. No evidence of mitral stenosis.  6. The aortic valve is tricuspid. There is mild calcification of the aortic valve. There is mild thickening of the aortic valve. Aortic valve regurgitation is not visualized. Aortic valve  sclerosis/calcification is present, without any evidence of aortic stenosis. FINDINGS  Left Ventricle: Severe asymmetric hypertrophy of the basal septum up to 52m. There is evidence of SAM of the MV leaflets with obstruction up to 37 mmHG (variation noted due to afib). 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  Would consider a cardiac MRI for better characterization. This was not well characterized on the last echocardiogram. Left ventricular ejection fraction, by estimation, is 65 to 70%. The left ventricle has normal function. The left ventricle has no regional wall motion abnormalities. The left ventricular internal cavity size was normal in size. There is severe asymmetric left ventricular hypertrophy of the basal-septal segment. Left ventricular diastolic function could not be evaluated due to atrial fibrillation. Left ventricular diastolic function could not be evaluated. Right Ventricle: The right ventricular size is normal. No increase in right ventricular wall thickness. Right ventricular systolic function is normal. Left Atrium: Left atrial size was mildly dilated. Right Atrium: Right atrial size was mildly dilated. Pericardium: Trivial pericardial effusion is present. Presence of epicardial fat layer. Mitral Valve: The mitral valve is degenerative in appearance. Trivial mitral valve regurgitation. No evidence of mitral valve stenosis. MV peak gradient, 7.3 mmHg. The mean mitral valve gradient is 2.0 mmHg. Tricuspid Valve: The tricuspid valve is grossly normal. Tricuspid valve regurgitation is mild . No evidence of tricuspid stenosis. Aortic Valve: The aortic valve is tricuspid. There is mild calcification of the aortic valve. There is mild thickening of the aortic valve. Aortic valve regurgitation is not visualized. Aortic valve sclerosis/calcification is present, without any evidence of aortic stenosis. Aortic valve mean gradient measures 7.5 mmHg. Aortic valve peak gradient  measures 16.2 mmHg. Aortic valve area, by VTI measures 2.37 cm. Pulmonic Valve: The pulmonic valve was grossly normal. Pulmonic valve regurgitation is not visualized. No evidence of pulmonic stenosis. Aorta: The aortic root and ascending aorta are structurally normal, with no evidence of dilitation. Venous: IVC assessment for right atrial pressure unable to be performed due to mechanical ventilation. IAS/Shunts: The atrial septum is grossly normal.  LEFT VENTRICLE PLAX 2D LVIDd:         3.80 cm LVIDs:         1.80 cm LV PW:         1.30 cm LV IVS:        1.70 cm LVOT diam:     2.00 cm LV SV:         84 LV SV Index:   48 LVOT Area:     3.14 cm  RIGHT VENTRICLE RV Basal diam:  3.70 cm RV Mid diam:    3.10 cm RV S prime:     12.80 cm/s LEFT ATRIUM             Index        RIGHT ATRIUM  Index LA diam:        4.50 cm 2.54 cm/m   RA Area:     18.10 cm LA Vol (A2C):   62.5 ml 35.31 ml/m  RA Volume:   53.70 ml  30.34 ml/m LA Vol (A4C):   62.0 ml 35.03 ml/m LA Biplane Vol: 62.6 ml 35.37 ml/m  AORTIC VALVE                     PULMONIC VALVE AV Area (Vmax):    2.31 cm      PV Vmax:       0.87 m/s AV Area (Vmean):   2.40 cm      PV Peak grad:  3.0 mmHg AV Area (VTI):     2.37 cm AV Vmax:           201.50 cm/s AV Vmean:          123.200 cm/s AV VTI:            0.355 m AV Peak Grad:      16.2 mmHg AV Mean Grad:      7.5 mmHg LVOT Vmax:         148.00 cm/s LVOT Vmean:        94.200 cm/s LVOT VTI:          0.268 m LVOT/AV VTI ratio: 0.75  AORTA Ao Root diam: 3.70 cm Ao Asc diam:  3.40 cm MITRAL VALVE                TRICUSPID VALVE MV Area (PHT): 3.95 cm     TR Peak grad:   35.0 mmHg MV Area VTI:   3.43 cm     TR Vmax:        296.00 cm/s MV Peak grad:  7.3 mmHg MV Mean grad:  2.0 mmHg     SHUNTS MV Vmax:       1.35 m/s     Systemic VTI:  0.27 m MV Vmean:      64.5 cm/s    Systemic Diam: 2.00 cm MV Decel Time: 192 msec MV E velocity: 118.00 cm/s Eleonore Chiquito MD Electronically signed by Eleonore Chiquito MD  Signature Date/Time: 09/14/2021/3:29:21 PM    Final    CT Head Wo Contrast  Result Date: 09/13/2021 CLINICAL DATA:  Headache, new or worsening. EXAM: CT HEAD WITHOUT CONTRAST TECHNIQUE: Contiguous axial images were obtained from the base of the skull through the vertex without intravenous contrast. RADIATION DOSE REDUCTION: This exam was performed according to the departmental dose-optimization program which includes automated exposure control, adjustment of the mA and/or kV according to patient size and/or use of iterative reconstruction technique. COMPARISON:  CT examination dated August 04, 2021 FINDINGS: Brain: No evidence of acute infarction, hemorrhage, hydrocephalus, extra-axial collection or mass lesion/mass effect. Low-attenuation of the periventricular and subcortical white matter presumed advanced chronic microvascular ischemic changes. Vascular: No hyperdense vessel or unexpected calcification. Skull: Normal. Negative for fracture or focal lesion. Sinuses/Orbits: Paranasal sinus postsurgical changes. Other: None. IMPRESSION: 1. No acute intracranial abnormality. 2. Advanced chronic microvascular ischemic changes of the supratentorial white matter. Electronically Signed   By: Keane Police D.O.   On: 09/13/2021 23:38   DG Chest Port 1 View  Result Date: 09/13/2021 CLINICAL DATA:  Hypoxia EXAM: PORTABLE CHEST 1 VIEW COMPARISON:  08/29/2021 FINDINGS: Cardiomegaly. Interstitial prominence throughout the lungs, right greater than left. This could reflect interstitial edema or atypical infection. NG tube is in the stomach. Endotracheal  tube is 3 cm above the carina. No effusions or acute bony abnormality. IMPRESSION: Cardiomegaly. Increasing interstitial prominence, right greater than left, favor interstitial edema although atypical infection cannot be excluded. Electronically Signed   By: Rolm Baptise M.D.   On: 09/13/2021 22:11   DG Chest 2 View  Result Date: 08/29/2021 CLINICAL DATA:  Pneumonia,  organism unspecified([id number]) EXAM: CHEST - 2 VIEW COMPARISON:  Chest x-ray 08/14/2021 FINDINGS: Enlarged cardiac silhouette. The heart and mediastinal contours are unchanged. Aortic calcification. Interval resolution of left base airspace opacity. No focal consolidation. Chronic coarsened markings with no overt pulmonary edema. No pleural effusion. No pneumothorax. No acute osseous abnormality. Diffuse decreased bone density. Chronic multilevel lower thoracic vertebral body height loss . IMPRESSION: 1. Interval resolution of left base airspace opacity. No active cardiopulmonary disease. 2.  Aortic Atherosclerosis (ICD10-I70.0). Electronically Signed   By: Iven Finn M.D.   On: 08/29/2021 16:32    Orson Eva, DO  [facility]  If 7PM-7AM, please contact night-coverage [email or web address] Password [facility] 09/16/2021, 8:37 AM   LOS: 3 days

## 2021-09-16 NOTE — Plan of Care (Signed)
  Problem: Acute Rehab PT Goals(only PT should resolve) Goal: Pt Will Go Supine/Side To Sit Outcome: Progressing Flowsheets (Taken 09/16/2021 1202) Pt will go Supine/Side to Sit:  with supervision  with modified independence Goal: Patient Will Transfer Sit To/From Stand Outcome: Progressing Flowsheets (Taken 09/16/2021 1202) Patient will transfer sit to/from stand:  with supervision  with modified independence Goal: Pt Will Transfer Bed To Chair/Chair To Bed Outcome: Progressing Flowsheets (Taken 09/16/2021 1202) Pt will Transfer Bed to Chair/Chair to Bed: min guard assist Goal: Pt Will Ambulate Outcome: Progressing Flowsheets (Taken 09/16/2021 1202) Pt will Ambulate:  15 feet  with minimal assist  with rolling walker   12:03 PM, 09/16/21 Lonell Grandchild, MPT Physical Therapist with [facility] [phone] office [phone] mobile phone

## 2021-09-16 NOTE — Evaluation (Signed)
Physical Therapy Evaluation Patient Details Name: [name] MRN: [id number] DOB: Jan 04, 1946 Today's Date: 09/16/2021  History of Present Illness  [name] is a 76 y.o. female with medical history significant of anxiety, atrial fibrillation, wheelchair dependence, hypertension, GERD, DVT in 2006, type 2 diabetes mellitus, and more presents to ED with a chief complaint of fever.  Unfortunately patient is not able to provide any history at all as she is on the vent and sedated.  She was brought in by facility staff that reported she was less responsive and having difficulty breathing.  Also noted her to have a fever.  In the ED patient was not protecting her airway and was quite hypoxic despite nasal cannula.  She was intubated.  Work-up revealed a UTI and interstitial edema versus atypical infection on chest x-ray.  CT head did not show any abnormality.  Patient had 2 L bolus, and continued to be hypotensive so she was started on Levophed.  Admission requested for septic shock.   Clinical Impression  Patient functioning near baseline for functional mobility and gait demonstrating slow labored movement for sitting up at bedside, transferring to chair and taking a few steps at bedside.  Patient limited mostly due to c/o fatigue and tolerated staying up in chair after therapy with SpO2 at 95% on room air - RN notified.  Patient will benefit from continued skilled physical therapy in hospital and recommended venue below to increase strength, balance, endurance for safe ADLs and gait.         Recommendations for follow up therapy are one component of a multi-disciplinary discharge planning process, led by the attending physician.  Recommendations may be updated based on patient status, additional functional criteria and insurance authorization.  Follow Up Recommendations Home health PT      Assistance Recommended at Discharge Intermittent Supervision/Assistance  Patient can return home with  the following  A lot of help with walking and/or transfers;A little help with bathing/dressing/bathroom;Assistance with cooking/housework;Help with stairs or ramp for entrance    Equipment Recommendations None recommended by PT  Recommendations for Other Services       Functional Status Assessment Patient has had a recent decline in their functional status and demonstrates the ability to make significant improvements in function in a reasonable and predictable amount of time.     Precautions / Restrictions Precautions Precautions: Fall Restrictions Weight Bearing Restrictions: No      Mobility  Bed Mobility Overal bed mobility: Needs Assistance Bed Mobility: Supine to Sit     Supine to sit: Min guard, Min assist     General bed mobility comments: increased time, labored movement    Transfers Overall transfer level: Needs assistance Equipment used: Rolling walker (2 wheels) Transfers: Sit to/from Stand, Bed to chair/wheelchair/BSC Sit to Stand: Min guard, Min assist   Step pivot transfers: Min assist       General transfer comment: slow labored movement    Ambulation/Gait Ambulation/Gait assistance: Min assist Gait Distance (Feet): 4 Feet Assistive device: Rolling walker (2 wheels) Gait Pattern/deviations: Decreased step length - right, Decreased step length - left, Decreased stride length Gait velocity: decreased     General Gait Details: limited to a few steps at bedside mostly due to c/o fatigue, on room air with SpO2 at 95%  Stairs            Wheelchair Mobility    Modified Rankin (Stroke Patients Only)       Balance Overall balance assessment: Needs assistance  Sitting-balance support: Feet supported, No upper extremity supported Sitting balance-Leahy Scale: Fair Sitting balance - Comments: fair/good seated at EOB   Standing balance support: During functional activity, Bilateral upper extremity supported Standing balance-Leahy Scale:  Fair Standing balance comment: using RW                             Pertinent Vitals/Pain Pain Assessment Pain Assessment: No/denies pain    Home Living Family/patient expects to be discharged to:: Assisted living                 Home Equipment: Conservation officer, nature (2 wheels);Wheelchair - manual;Hospital bed      Prior Function Prior Level of Function : Needs assist       Physical Assist : Mobility (physical);ADLs (physical) Mobility (physical): Bed mobility;Transfers;Gait;Stairs   Mobility Comments: Assisted for transfers and a few steps using RW during transfers ADLs Comments: asissted by ALF staff. Pt reports independence with toileting, grooming, and eating.     Hand Dominance   Dominant Hand: Right    Extremity/Trunk Assessment   Upper Extremity Assessment Upper Extremity Assessment: Overall WFL for tasks assessed    Lower Extremity Assessment Lower Extremity Assessment: Generalized weakness    Cervical / Trunk Assessment Cervical / Trunk Assessment: Normal  Communication   Communication: No difficulties  Cognition Arousal/Alertness: Awake/alert Behavior During Therapy: WFL for tasks assessed/performed Overall Cognitive Status: Within Functional Limits for tasks assessed                                          General Comments      Exercises     Assessment/Plan    PT Assessment Patient needs continued PT services  PT Problem List Decreased strength;Decreased activity tolerance;Decreased balance;Decreased mobility       PT Treatment Interventions DME instruction;Gait training;Therapeutic activities;Therapeutic exercise;Patient/family education;Balance training;Functional mobility training    PT Goals (Current goals can be found in the Care Plan section)  Acute Rehab PT Goals Patient Stated Goal: return home with ALF staff to assist PT Goal Formulation: With patient Time For Goal Achievement: 09/20/21 Potential to  Achieve Goals: Good    Frequency Min 3X/week     Co-evaluation               AM-PAC PT "6 Clicks" Mobility  Outcome Measure Help needed turning from your back to your side while in a flat bed without using bedrails?: A Little Help needed moving from lying on your back to sitting on the side of a flat bed without using bedrails?: A Little Help needed moving to and from a bed to a chair (including a wheelchair)?: A Little Help needed standing up from a chair using your arms (e.g., wheelchair or bedside chair)?: A Little Help needed to walk in hospital room?: A Lot Help needed climbing 3-5 steps with a railing? : A Lot 6 Click Score: 16    End of Session   Activity Tolerance: Patient tolerated treatment well;Patient limited by fatigue Patient left: in chair;with call bell/phone within reach Nurse Communication: Mobility status PT Visit Diagnosis: Unsteadiness on feet (R26.81);Other abnormalities of gait and mobility (R26.89);Muscle weakness (generalized) (M62.81)    Time: 2202-5427 PT Time Calculation (min) (ACUTE ONLY): 28 min   Charges:   PT Evaluation $PT Eval Moderate Complexity: 1 Mod PT Treatments $Therapeutic Activity: 23-37 mins  12:00 PM, 09/16/21 Lonell Grandchild, MPT Physical Therapist with [facility] [phone] office [phone] mobile phone

## 2021-09-16 NOTE — Progress Notes (Signed)
NAME:  [name], MRN:  [id number], DOB:  1945-11-22, LOS: 3 ADMISSION DATE:  09/13/2021, CONSULTATION DATE:  09/16/2021  REFERRING MD:  Tat, TRH , CHIEF COMPLAINT:  shock, intubated   History of Present Illness:  76 year old wheelchair bound with mental health issues, NHR, BIBEMS for less responsiveness and dyspnea with fever.  She was intubated in the ED for airway protection and hypoxia.  Chest x-ray showed interstitial edema and bibasilar infiltrates.  She was given 2 L fluid bolus for hypotension and started on Levophed peripherally Initial labs shows WBC count 17 K, BNP 229, head CT was negative UA showed more than 50 WBCs  Pertinent  Medical History  Type 2 diabetes Atrial fibrillation DVT 2006 Schizophrenia, depression, psychosis, previous suicide attempt. CVA with left-sided weakness She has legal guardian -[facility] Events: Including procedures, antibiotic start and stop dates in addition to other pertinent events   Echo 8/23 HOCM physiology Urine culture 8/22 E. Coli 8/24 extubated  Interim History / Subjective:   Extubated yesterday and has done well. Placed on BiPAP last night for increased respiratory rate. Afebrile Off Levophed  Objective   Blood pressure (!) 143/77, pulse (!) 122, temperature 99 F (37.2 C), resp. rate (!) 22, height '5\' 7"'$  (1.702 m), weight 57.4 kg, SpO2 98 %.    Vent Mode: BIPAP FiO2 (%):  [35 %] 35 % PEEP:  [5 cmH20] 5 cmH20   Intake/Output Summary (Last 24 hours) at 09/16/2021 1256 Last data filed at 09/16/2021 0300 Gross per 24 hour  Intake 339.11 ml  Output 1525 ml  Net -1185.89 ml    Filed Weights   09/14/21 0449 09/15/21 0314 09/16/21 0334  Weight: 66.3 kg 58.5 kg 57.4 kg    Examination: General: Elderly woman, sitting up in bed, no distress HENT: Pallor, no icterus, no JVD Lungs: No accessory muscle use, clear breath sounds bilateral Cardiovascular: S1-S2 irregular, no murmur Abdomen:  Soft, nontender abdomen, no organomegaly Extremities: 1+ edema, no deformity Neuro: Awake, responds to simple questions and commands, nonfocal GU: Clear urine  Chest x-ray 8/22 independently reviewed, ET tube in position Labs show no more electrolytes, decrease leukocytosis  Resolved Hospital Problem list     Assessment & Plan:  Septic shock -likely related to E. coli UTI, pneumococcal bibasilar pneumonia. -Can switch to single agent ceftriaxone   Acute respiratory failure -with hypoxia -Intubated for airway protection -Extubated 8/24 and tolerating well  HOCM  /atrial fibrillation with RVR-avoid tachycardia and hypovolemia -Resume metoprolol -If hypotensive, use Neo-Synephrine   Schizophrenia/depression -Resume home meds including Klonopin , Abilify and Cymbalta -PT eval  PCCM available as needed  Best Practice (right click and "Reselect all SmartList Selections" daily)   Diet/type: Regular consistency (see orders)  DVT prophylaxis: LMWH GI prophylaxis: PPI Lines: N/A Foley:  Yes, and it is still needed Code Status:  full code Last date of multidisciplinary goals of care discussion [updated daughter at bedside, she is ward of the state]  Labs   CBC: Recent Labs  Lab 09/13/21 2121 09/14/21 0310 09/15/21 0253 09/16/21 0553  WBC 17.1* 15.7* 16.6* 13.5*  NEUTROABS 15.0* 13.6*  --   --   HGB 10.0* 9.4* 10.3* 11.2*  HCT 31.3* 29.5* 31.5* 34.2*  MCV 86.9 87.0 84.5 84.2  PLT 273 216 231 251     Basic Metabolic Panel: Recent Labs  Lab 09/13/21 2121 09/14/21 0310 09/15/21 0253 09/16/21 0553  NA 134* 135 133* 135  K 4.1 4.0 2.9* 3.8  CL 98 103 97* 99  CO2 '25 25 27 29  '$ GLUCOSE 158* 196* 153* 157*  BUN '17 15 11 13  '$ CREATININE 0.99 0.79 0.67 0.63  CALCIUM 8.3* 7.7* 7.9* 8.4*  MG  --  1.8 1.9 2.0  PHOS  --  3.8  --   --     GFR: Estimated Creatinine Clearance: 54.2 mL/min (by C-G formula based on SCr of 0.63 mg/dL). Recent Labs  Lab 09/13/21 2121  09/13/21 2301 09/14/21 0310 09/14/21 0351 09/15/21 0253 09/16/21 0553  PROCALCITON  --   --  0.18  --   --   --   WBC 17.1*  --  15.7*  --  16.6* 13.5*  LATICACIDVEN 1.5 2.3*  --  1.2  --   --      Liver Function Tests: Recent Labs  Lab 09/13/21 2121 09/14/21 0310  AST 23 19  ALT 19 16  ALKPHOS 84 64  BILITOT 0.6 1.0  PROT 7.5 6.0*  ALBUMIN 3.5 2.8*    No results for input(s): "LIPASE", "AMYLASE" in the last 168 hours. No results for input(s): "AMMONIA" in the last 168 hours.  ABG    Component Value Date/Time   PHART 7.42 09/14/2021 0421   PCO2ART 43 09/14/2021 0421   PO2ART 131 (H) 09/14/2021 0421   HCO3 27.9 09/14/2021 0421   O2SAT 100 09/14/2021 0421     Coagulation Profile: Recent Labs  Lab 09/13/21 2121 09/14/21 0310  INR 1.6* 1.6*     Cardiac Enzymes: No results for input(s): "CKTOTAL", "CKMB", "CKMBINDEX", "TROPONINI" in the last 168 hours.  HbA1C: HbA1c, POC (controlled diabetic range)  Date/Time Value Ref Range Status  08/16/2021 09:34 AM 6.2 0.0 - 7.0 % Final  12/09/2020 10:14 AM 7.5 (A) 0.0 - 7.0 % Final   Hgb A1c MFr Bld  Date/Time Value Ref Range Status  03/22/2021 09:38 AM 7.1 (H) 4.8 - 5.6 % Final    Comment:    (NOTE) Pre diabetes:          5.7%-6.4%  Diabetes:              >6.4%  Glycemic control for   <7.0% adults with diabetes     CBG: Recent Labs  Lab 09/15/21 2152 09/15/21 2318 09/16/21 0337 09/16/21 0739 09/16/21 1147  GLUCAP 193* 207* 127* 153* 131*       Kara Mead MD. FCCP. [facility] Pager : [phone]  If no response to pager , please call [phone] until 7 pm After 7:00 pm call Elink  [phone]   09/16/2021

## 2021-09-17 DIAGNOSIS — I5031 Acute diastolic (congestive) heart failure: Secondary | ICD-10-CM | POA: Diagnosis not present

## 2021-09-17 DIAGNOSIS — G9341 Metabolic encephalopathy: Secondary | ICD-10-CM | POA: Diagnosis not present

## 2021-09-17 DIAGNOSIS — J9601 Acute respiratory failure with hypoxia: Secondary | ICD-10-CM | POA: Diagnosis not present

## 2021-09-17 DIAGNOSIS — J209 Acute bronchitis, unspecified: Secondary | ICD-10-CM

## 2021-09-17 DIAGNOSIS — A419 Sepsis, unspecified organism: Secondary | ICD-10-CM | POA: Diagnosis not present

## 2021-09-17 LAB — MAGNESIUM: Magnesium: 2.2 mg/dL (ref 1.7–2.4)

## 2021-09-17 LAB — GLUCOSE, CAPILLARY
Glucose-Capillary: 136 mg/dL — ABNORMAL HIGH (ref 70–99)
Glucose-Capillary: 143 mg/dL — ABNORMAL HIGH (ref 70–99)
Glucose-Capillary: 142 mg/dL — ABNORMAL HIGH (ref 70–99)
Glucose-Capillary: 124 mg/dL — ABNORMAL HIGH (ref 70–99)
Glucose-Capillary: 106 mg/dL — ABNORMAL HIGH (ref 70–99)
Glucose-Capillary: 146 mg/dL — ABNORMAL HIGH (ref 70–99)
Glucose-Capillary: 124 mg/dL — ABNORMAL HIGH (ref 70–99)

## 2021-09-17 LAB — CBC
HCT: 32.7 % — ABNORMAL LOW (ref 36.0–46.0)
Hemoglobin: 10.6 g/dL — ABNORMAL LOW (ref 12.0–15.0)
MCH: 27.3 pg (ref 26.0–34.0)
MCHC: 32.4 g/dL (ref 30.0–36.0)
MCV: 84.3 fL (ref 80.0–100.0)
Platelets: 264 10*3/uL (ref 150–400)
RBC: 3.88 MIL/uL (ref 3.87–5.11)
RDW: 14.6 % (ref 11.5–15.5)
WBC: 5.2 10*3/uL (ref 4.0–10.5)
nRBC: 0 % (ref 0.0–0.2)

## 2021-09-17 LAB — CARBAPENEM RESISTANCE PANEL
Carba Resistance IMP Gene: NOT DETECTED
Carba Resistance KPC Gene: NOT DETECTED
Carba Resistance NDM Gene: NOT DETECTED
Carba Resistance OXA48 Gene: NOT DETECTED
Carba Resistance VIM Gene: NOT DETECTED

## 2021-09-17 LAB — BASIC METABOLIC PANEL
Anion gap: 9 (ref 5–15)
BUN: 19 mg/dL (ref 8–23)
CO2: 27 mmol/L (ref 22–32)
Calcium: 8.7 mg/dL — ABNORMAL LOW (ref 8.9–10.3)
Chloride: 102 mmol/L (ref 98–111)
Creatinine, Ser: 0.64 mg/dL (ref 0.44–1.00)
GFR, Estimated: >60 mL/min (ref >60)
Glucose, Bld: 148 mg/dL — ABNORMAL HIGH (ref 70–99)
Potassium: 4 mmol/L (ref 3.5–5.1)
Sodium: 138 mmol/L (ref 135–145)

## 2021-09-17 MED ORDER — INSULIN ASPART 100 UNIT/ML IJ SOLN
0.0000 [IU] | Freq: Three times a day (TID) | INTRAMUSCULAR | Status: DC
  Administered 2021-09-18: 1 [IU] via SUBCUTANEOUS
  Administered 2021-09-18 – 2021-09-19 (×4): 2 [IU] via SUBCUTANEOUS

## 2021-09-17 MED ORDER — INSULIN ASPART 100 UNIT/ML IJ SOLN: 0.0000 [IU] | Freq: Every day | INTRAMUSCULAR | Status: DC

## 2021-09-17 MED ORDER — METOPROLOL SUCCINATE ER 50 MG PO TB24
100.0000 mg | ORAL_TABLET | Freq: Every day | ORAL | Status: DC
  Administered 2021-09-17 – 2021-09-18 (×2): 100 mg via ORAL
  Filled 2021-09-17 (×2): qty 2

## 2021-09-17 MED ORDER — PROCHLORPERAZINE EDISYLATE 10 MG/2ML IJ SOLN
5.0000 mg | Freq: Four times a day (QID) | INTRAMUSCULAR | Status: DC | PRN
  Administered 2021-09-17 – 2021-09-19 (×5): 5 mg via INTRAVENOUS
  Filled 2021-09-17 (×6): qty 2

## 2021-09-17 MED ORDER — APIXABAN 5 MG PO TABS
5.0000 mg | ORAL_TABLET | Freq: Two times a day (BID) | ORAL | Status: DC
  Administered 2021-09-17 – 2021-09-19 (×4): 5 mg via ORAL
  Filled 2021-09-17 (×4): qty 1

## 2021-09-17 MED ORDER — NITROFURANTOIN MONOHYD MACRO 100 MG PO CAPS
100.0000 mg | ORAL_CAPSULE | Freq: Two times a day (BID) | ORAL | Status: DC
  Administered 2021-09-17 – 2021-09-19 (×4): 100 mg via ORAL
  Filled 2021-09-17 (×9): qty 1

## 2021-09-17 NOTE — Assessment & Plan Note (Addendum)
Trach aspirate = S. pneuomoniae Continue ceftriaxone Finished 7 days abx during hospitalizatoin

## 2021-09-17 NOTE — Progress Notes (Addendum)
PROGRESS NOTE  [name]:[id number] DOB: 1945/06/11 DOA: 09/13/2021 PCP: Carrolyn Meiers, MD  Brief History:  76 year old female with a history of schizophrenia, hypertension, diabetes mellitus type 2, DVT, anxiety, and atrial fibrillation presenting with altered mental status and hypoxia.  The patient normally resides at [facility]. Unfortunately patient is not able to provide any history at all as she is on the vent and sedated.  She was brought in by facility staff that reported she was less responsive and having difficulty breathing.  Also noted her to have a fever.  In the ED patient was not protecting her airway and was quite hypoxic despite nasal cannula.  She was intubated.  Work-up revealed a UTI and interstitial edema versus atypical infection on chest x-ray.  CT head did not show any abnormality.  Patient had 2 L bolus, and continued to be hypotensive so she was started on Levophed.  Admission requested for septic shock. PCCM consulted to assist with management. Echo showed EF 65-70% with HOCM.  Cardiology was consulted to assist with management.    Assessment and Plan: * Septic shock (HCC) - Fever 100.6, tachycardia 116, respiratory rate 25, blood pressure 70/43, acute respiratory failure with an oxygen sat of 78%, leukocytosis 17, lactic acidosis 2.3 - Secondary to UTI  - Continue IV antibiotics - Patient is requiring 7 mcg of Levophed>>weaned off 8/24 afternoon - This is after receiving her 30 mL/kg bolus - Continue cefepime and doxycycline   Schizophrenia (HCC) Continue  Risperdal, Remeron, Cymbalta--restarted Continue clonazepam--restarted  Uncontrolled type 2 diabetes mellitus with hyperglycemia, with long-term current use of insulin (HCC) Continue Levemir 10 units hx Continue NovoLog sliding scale 03/22/2021 hemoglobin A1c 7.1 08/19/21 A1C--6.2  GERD (gastroesophageal reflux disease) - Continue Protonix  Acute respiratory failure with  hypoxia (HCC) - Secondary to pulmonary edema - Chest x-ray shows interstitial pulmonary edema versus atypical infection- patient was discharged July 23 after admission from pneumonia, those infiltrates should be resolved by now - trach aspirate culture S.pneumoniae -appreciate PCCM--discussed with Dr. Elsworth Soho --extubated 09/15/21 afternoon --MRSA screen is positive --now remains stable on RA  Acute diastolic CHF (congestive heart failure) ([place]) Continue IV lasix>>stop as pt more euvolemic now with HOCM 10/15/19 Echo EF 60-65%, no WMA, mild MR Repeat Echo EF 65-70%, +HOCM, no WMA Accurate I/O--6.1L out last 72 hours Cardiology consult appreciated  UTI (urinary tract infection) -UA >50 WBC - Urine culture MDR Ecoli - Blood culture neg to date - start nitrofurantoin  Acute tracheobronchitis Trach aspirate = S. pneuomoniae Continue ceftriaxone  DVT, lower extremity, recurrent (HCC) On [place] lovenox currently>>plan to transition back to apixaban   Stricture and stenosis of esophagus 07/23/2021 EGD--showed food in the middle third and lower third of the esophagus.  The patient was subsequently debated during the procedure secondary to aspiration risk.  90% of the retained food was removed with 10% pushed into the stomach.  A distal esophageal stricture was noted. -Continue full liquid diet once extubated -Continues to have dysphagia -07/25/21 barium swallow>>Nonspecific esophageal dysmotility disorder, with termination of primary peristaltic waves in the mid esophagus, and tertiary contractions in the esophagus Continue IV pantoprazole twice daily  Opioid dependence ([place]) PDMP reviewed Norco 10/325, #90, last refilled on 09/01/21, 08/03/21 Clonazepam 0.5 mg, #90, last filled on 09/08/21, 08/26/21  Essential hypertension Holding dilitazem and metoprolol succinate due to hypotension initially --restart metoprolol succinate now that BPs improving  Goals of care, counseling/discussion Discussed  with patient's Legal Guardian, [name] --agrees with DNR  Pressure injury of skin Stage 1 on buttock and left hip Present on admission Local care  IV infiltration IV with levophed infiltrated in right arm --given phentolamine --no signs of necrosis presently  Acute metabolic encephalopathy - obtunded in ED initially - CT head shows no acute intracranial abnormality - Patient was hypotensive which is likely contributing at 70/43, but also hypoxic as she was not protecting her airway at 78% - intubated in ED to protect airway - Initial pH 7.3, PCO2 33 -  altered mental status secondary to sepsis, hypoxia and hypotension - overall improved   Paroxysmal atrial fibrillation (HCC) Continue lovenox for [facility] with plans to switch back to apixaban Continue amiodarone Holding metoprolol succinate and diltiazem CD due to hypotension initially Having some RVR as expected Increase metoprolol succinate to 100 mg daily    Family Communication:   daughter updated at bedside 8/23;  Legal guardian updated 8/25   Consultants:  PCCM   Code Status:  FULL    DVT Prophylaxis:  apixaban     Procedures: As Listed in Progress Note Above   Antibiotics: Cefepime 8/22>> Doxy 8/22>>8/25 Vanc 8/22>> 8/25 Nitrofurantoin 8/26>>         Subjective: Patient denies fevers, chills, headache, chest pain, dyspnea,vomiting, diarrhea, abdominal pain, dysuria, hematuria, hematochezia, and melena. She complains of nausea and back pain  Objective: Vitals:   09/17/21 1000 09/17/21 1058 09/17/21 1100 09/17/21 1200  BP: (!) 139/99  (!) 108/51 130/80  Pulse: (!) 122 (!) 117 (!) 107 (!) 134  Resp: (!) 25 (!) 34 16 18  Temp:  98.4 F (36.9 C)    TempSrc:  Oral    SpO2: 98% 96% 96% 99%  Weight:      Height:        Intake/Output Summary (Last 24 hours) at 09/17/2021 1338 Last data filed at 09/17/2021 1200 Gross per 24 hour  Intake 700 ml  Output 1150 ml  Net -450 ml   Weight change: 0.9  kg Exam:  General:  Pt is alert, follows commands appropriately, not in acute distress HEENT: No icterus, No thrush, No neck mass, [place]/AT Cardiovascular: RRR, S1/S2, no rubs, no gallops Respiratory: bibasilar rales. No wheeze Abdomen: Soft/+BS, non tender, non distended, no guarding Extremities: trace LE edema, No lymphangitis, No petechiae, No rashes, no synovitis   Data Reviewed: I have personally reviewed following labs and imaging studies Basic Metabolic Panel: Recent Labs  Lab 09/13/21 2121 09/14/21 0310 09/15/21 0253 09/16/21 0553 09/17/21 0404  NA 134* 135 133* 135 138  K 4.1 4.0 2.9* 3.8 4.0  CL 98 103 97* 99 102  CO2 '25 25 27 29 27  '$ GLUCOSE 158* 196* 153* 157* 148*  BUN '17 15 11 13 19  '$ CREATININE 0.99 0.79 0.67 0.63 0.64  CALCIUM 8.3* 7.7* 7.9* 8.4* 8.7*  MG  --  1.8 1.9 2.0 2.2  PHOS  --  3.8  --   --   --    Liver Function Tests: Recent Labs  Lab 09/13/21 2121 09/14/21 0310  AST 23 19  ALT 19 16  ALKPHOS 84 64  BILITOT 0.6 1.0  PROT 7.5 6.0*  ALBUMIN 3.5 2.8*   No results for input(s): "LIPASE", "AMYLASE" in the last 168 hours. No results for input(s): "AMMONIA" in the last 168 hours. Coagulation Profile: Recent Labs  Lab 09/13/21 2121 09/14/21 0310  INR 1.6* 1.6*   CBC: Recent Labs  Lab 09/13/21 2121  09/14/21 0310 09/15/21 0253 09/16/21 0553 09/17/21 0725  WBC 17.1* 15.7* 16.6* 13.5* 5.2  NEUTROABS 15.0* 13.6*  --   --   --   HGB 10.0* 9.4* 10.3* 11.2* 10.6*  HCT 31.3* 29.5* 31.5* 34.2* 32.7*  MCV 86.9 87.0 84.5 84.2 84.3  PLT 273 216 231 251 264   Cardiac Enzymes: No results for input(s): "CKTOTAL", "CKMB", "CKMBINDEX", "TROPONINI" in the last 168 hours. BNP: Invalid input(s): "POCBNP" CBG: Recent Labs  Lab 09/16/21 2011 09/17/21 0115 09/17/21 0407 09/17/21 0721 09/17/21 1058  GLUCAP 115* 136* 143* 142* 124*   HbA1C: No results for input(s): "HGBA1C" in the last 72 hours. Urine analysis:    Component Value Date/Time    COLORURINE YELLOW 09/13/2021 2212   APPEARANCEUR CLOUDY (A) 09/13/2021 2212   APPEARANCEUR Cloudy (A) 05/01/2016 1525   LABSPEC 1.017 09/13/2021 2212   PHURINE 6.0 09/13/2021 2212   GLUCOSEU NEGATIVE 09/13/2021 2212   HGBUR NEGATIVE 09/13/2021 2212   BILIRUBINUR NEGATIVE 09/13/2021 2212   BILIRUBINUR Negative 05/01/2016 1525   KETONESUR NEGATIVE 09/13/2021 2212   PROTEINUR 30 (A) 09/13/2021 2212   UROBILINOGEN 0.2 09/14/2014 1703   NITRITE POSITIVE (A) 09/13/2021 2212   LEUKOCYTESUR LARGE (A) 09/13/2021 2212   Sepsis Labs: '@LABRCNTIP'$ (procalcitonin:4,lacticidven:4) ) Recent Results (from the past 240 hour(s))  Resp Panel by RT-PCR (Flu A&B, Covid) Anterior Nasal Swab     Status: None   Collection Time: 09/13/21  9:20 PM   Specimen: Anterior Nasal Swab  Result Value Ref Range Status   SARS Coronavirus 2 by RT PCR NEGATIVE NEGATIVE Final    Comment: (NOTE) SARS-CoV-2 target nucleic acids are NOT DETECTED.  The SARS-CoV-2 RNA is generally detectable in upper respiratory specimens during the acute phase of infection. The lowest concentration of SARS-CoV-2 viral copies this assay can detect is 138 copies/mL. A negative result does not preclude SARS-Cov-2 infection and should not be used as the sole basis for treatment or other patient management decisions. A negative result may occur with  improper specimen collection/handling, submission of specimen other than nasopharyngeal swab, presence of viral mutation(s) within the areas targeted by this assay, and inadequate number of viral copies(<138 copies/mL). A negative result must be combined with clinical observations, patient history, and epidemiological information. The expected result is Negative.  Fact Sheet for Patients:  EntrepreneurPulse.com.au  Fact Sheet for Healthcare Providers:  IncredibleEmployment.be  This test is no t yet approved or cleared by the [place] FDA and  has been  authorized for detection and/or diagnosis of SARS-CoV-2 by FDA under an Emergency Use Authorization (EUA). This EUA will remain  in effect (meaning this test can be used) for the duration of the COVID-19 declaration under Section 564(b)(1) of the Act, 21 U.S.C.section 360bbb-3(b)(1), unless the authorization is terminated  or revoked sooner.       Influenza A by PCR NEGATIVE NEGATIVE Final   Influenza B by PCR NEGATIVE NEGATIVE Final    Comment: (NOTE) The Xpert Xpress SARS-CoV-2/FLU/RSV plus assay is intended as an aid in the diagnosis of influenza from Nasopharyngeal swab specimens and should not be used as a sole basis for treatment. Nasal washings and aspirates are unacceptable for Xpert Xpress SARS-CoV-2/FLU/RSV testing.  Fact Sheet for Patients: EntrepreneurPulse.com.au  Fact Sheet for Healthcare Providers: IncredibleEmployment.be  This test is not yet approved or cleared by the [place] FDA and has been authorized for detection and/or diagnosis of SARS-CoV-2 by FDA under an Emergency Use Authorization (EUA). This EUA will remain in effect (  meaning this test can be used) for the duration of the COVID-19 declaration under Section 564(b)(1) of the Act, 21 U.S.C. section 360bbb-3(b)(1), unless the authorization is terminated or revoked.  Performed at [facility], [address], [place], [place] [address]   Culture, blood (routine x 2)     Status: None (Preliminary result)   Collection Time: 09/13/21  9:20 PM   Specimen: BLOOD RIGHT HAND  Result Value Ref Range Status   Specimen Description BLOOD RIGHT HAND  Final   Special Requests   Final    BOTTLES DRAWN AEROBIC AND ANAEROBIC Blood Culture results may not be optimal due to an inadequate volume of blood received in culture bottles   Culture   Final    NO GROWTH 3 DAYS Performed at [facility], [address] [place], [place] [address]    Report Status PENDING  Incomplete   Urine Culture     Status: Abnormal   Collection Time: 09/13/21 10:12 PM   Specimen: Urine, Clean Catch  Result Value Ref Range Status   Specimen Description   Final    URINE, CLEAN CATCH Performed at [facility], [address], [place], [place] [address]    Special Requests   Final    NONE Performed at [facility], [address] [place], [place] [address]    Culture (A)  Final    >=100,000 COLONIES/mL ESCHERICHIA COLI Susceptibility Pattern Suggests Possibility of an Extended Spectrum Beta Lactamase Producer. Contact Laboratory Within 7 Days if Confirmation Warranted. MULTI-DRUG RESISTANT ORGANISM CRITICAL RESULT CALLED TO, READ BACK BY AND VERIFIED WITH: Merrilee Seashore COFFEE [id number] AT 1138 BY CM Performed at [facility], [place] [address], [place], [place] [address]    Report Status 09/17/2021 FINAL  Final   Organism ID, Bacteria ESCHERICHIA COLI (A)  Final      Susceptibility   Escherichia coli - MIC*    AMPICILLIN >=32 RESISTANT Resistant     CEFAZOLIN >=64 RESISTANT Resistant     CEFEPIME >=32 RESISTANT Resistant     CEFTRIAXONE >=64 RESISTANT Resistant     CIPROFLOXACIN >=4 RESISTANT Resistant     GENTAMICIN >=16 RESISTANT Resistant     IMIPENEM RESISTANT Resistant     NITROFURANTOIN <=16 SENSITIVE Sensitive     TRIMETH/SULFA >=320 RESISTANT Resistant     AMPICILLIN/SULBACTAM >=32 RESISTANT Resistant     PIP/TAZO 64 INTERMEDIATE Intermediate     * >=100,000 COLONIES/mL ESCHERICHIA COLI  Culture, blood (routine x 2)     Status: None (Preliminary result)   Collection Time: 09/13/21 11:01 PM   Specimen: BLOOD  Result Value Ref Range Status   Specimen Description BLOOD  Final   Special Requests NONE  Final   Culture   Final    NO GROWTH 3 DAYS Performed at [facility], [address] [place], [place] [address]    Report Status PENDING  Incomplete  MRSA Next Gen by PCR, Nasal     Status: Abnormal   Collection Time: 09/14/21  1:50 AM   Specimen: Nasal Mucosa;  Nasal Swab  Result Value Ref Range Status   MRSA by PCR Next Gen DETECTED (A) NOT DETECTED Final    Comment: RESULT CALLED TO, READ BACK BY AND VERIFIED WITH: SUPPER,B'@0507'$  BY MATTHEWS, B 8.23.23 (NOTE) The GeneXpert MRSA Assay (FDA approved for NASAL specimens only), is one component of a comprehensive MRSA colonization surveillance program. It is not intended to diagnose MRSA infection nor to guide or monitor treatment for MRSA  infections. Test performance is not FDA approved in patients less than 62 years old. Performed at [facility], [address] [place], [place] [address]   Expectorated Sputum Assessment w Gram Stain, Rflx to Resp Cult     Status: None (Preliminary result)   Collection Time: 09/14/21  2:48 AM   Specimen: Expectorated Sputum  Result Value Ref Range Status   Specimen Description   Final    EXPECTORATED SPUTUM Performed at [facility], [address], [place], [place] [address]    Special Requests   Final    NONE Performed at [facility], [address], [place], [place] [address]    Sputum evaluation   Final    THIS SPECIMEN IS ACCEPTABLE FOR SPUTUM CULTURE Performed at [facility], [place] [address], [place], [place] [address]    Report Status PENDING  Incomplete  Culture, Respiratory w Gram Stain     Status: None   Collection Time: 09/14/21  2:48 AM  Result Value Ref Range Status   Specimen Description EXPECTORATED SPUTUM  Final   Special Requests NONE Reflexed from [id number]  Final   Gram Stain   Final    MODERATE WBC PRESENT,BOTH PMN AND MONONUCLEAR FEW GRAM POSITIVE COCCI IN PAIRS RARE GRAM POSITIVE RODS Performed at [facility], [place] [address], [place], [place] [address]    Culture ABUNDANT STREPTOCOCCUS PNEUMONIAE  Final   Report Status 09/16/2021 FINAL  Final   Organism ID, Bacteria STREPTOCOCCUS PNEUMONIAE  Final      Susceptibility   Streptococcus pneumoniae - MIC*    ERYTHROMYCIN 2 RESISTANT Resistant     LEVOFLOXACIN 0.5  SENSITIVE Sensitive     VANCOMYCIN 0.5 SENSITIVE Sensitive     PENICILLIN (meningitis) 0.25 RESISTANT Resistant     PENO - penicillin 0.25      PENICILLIN (non-meningitis) 0.25 SENSITIVE Sensitive     PENICILLIN (oral) 0.25 INTERMEDIATE Intermediate     CEFTRIAXONE (non-meningitis) 0.25 SENSITIVE Sensitive     CEFTRIAXONE (meningitis) 0.25 SENSITIVE Sensitive     * ABUNDANT STREPTOCOCCUS PNEUMONIAE  Carbapenem Resistance Panel     Status: None   Collection Time: 09/14/21 10:16 AM  Result Value Ref Range Status   Carba Resistance IMP Gene NOT DETECTED NOT DETECTED Final   Carba Resistance VIM Gene NOT DETECTED NOT DETECTED Final   Carba Resistance NDM Gene NOT DETECTED NOT DETECTED Final   Carba Resistance KPC Gene NOT DETECTED NOT DETECTED Final   Carba Resistance OXA48 Gene NOT DETECTED NOT DETECTED Final    Comment: (NOTE) Cepheid Carba-R is an FDA-cleared nucleic acid amplification test  (NAAT)for the detection and differentiation of genes encoding the  most prevalent carbapenemases in bacterial isolate samples. Carbapenemase gene identification and implementation of comprehensive  infection control measures are recommended by the CDC to prevent the  spread of the resistant organisms. Performed at [facility], [place] [address] [place],  [address]      Scheduled Meds:  amiodarone  200 mg Oral Daily   apixaban  5 mg Oral BID   ARIPiprazole  2 mg Oral Daily   busPIRone  7.5 mg Oral BID   Chlorhexidine Gluconate Cloth  6 each Topical Q0600   clonazePAM  0.5 mg Oral TID   DULoxetine  60 mg Oral Daily   insulin aspart  0-5 Units Subcutaneous QHS   insulin aspart  0-9 Units Subcutaneous TID WC   insulin detemir  10 Units Subcutaneous QHS   metoprolol succinate  100 mg Oral Daily   midodrine  10 mg Oral TID WC   mupirocin ointment   Nasal BID   nitrofurantoin (macrocrystal-monohydrate)  100 mg Oral Q12H   pantoprazole (PROTONIX) IV  40 mg Intravenous Q12H    potassium chloride  40 mEq Oral BID   Continuous Infusions:  cefTRIAXone (ROCEPHIN)  IV Stopped (09/16/21 1805)    Procedures/Studies: [place] EKG SITE RITE  Result Date: 09/15/2021 If Site Rite image not attached, placement could not be confirmed due to current cardiac rhythm.  ECHOCARDIOGRAM COMPLETE  Result Date: 09/14/2021    ECHOCARDIOGRAM REPORT   Patient Name:   [name] Date of Exam: 09/14/2021 Medical Rec #:  [id number]       Height:       67.0 in Accession #:    [id number]      Weight:       146.2 lb Date of Birth:  1945/04/04       BSA:          1.770 m Patient Age:    62 years        BP:           129/67 mmHg Patient Gender: F               HR:           84 bpm. Exam Location:  [facility] Procedure: 2D Echo, Cardiac Doppler and Color Doppler Indications:    CHF  History:        Patient has prior history of Echocardiogram examinations, most                 recent 10/05/2019. CHF, Stroke, Arrythmias:Atrial Fibrillation,                 Signs/Symptoms:Altered Mental Status; Risk Factors:Hypertension,                 Diabetes and Former Smoker.  Sonographer:    Wenda Low Referring Phys: [phone] Aarik Blank  Sonographer Comments: Echo performed with patient supine and on artificial respirator. IMPRESSIONS  1. Severe asymmetric hypertrophy of the basal septum up to 64m. There is evidence of SAM of the MV leaflets with obstruction up to 37 mmHG (variation noted due to afib). 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 Would consider a cardiac MRI for better characterization. This was not well characterized on the last echocardiogram. Left ventricular ejection fraction, by estimation, is 65 to 70%. The left ventricle has normal function. The left ventricle has no regional wall motion abnormalities. There is severe asymmetric left ventricular hypertrophy of the basal-septal segment. Left ventricular diastolic function could not be evaluated.  2. Right ventricular systolic  function is normal. The right ventricular size is normal.  3. Left atrial size was mildly dilated.  4. Right atrial size was mildly dilated.  5. The mitral valve is degenerative. Trivial mitral valve regurgitation. No evidence of mitral stenosis.  6. The aortic valve is tricuspid. There is mild calcification of the aortic valve. There is mild thickening of the aortic valve. Aortic valve regurgitation is not visualized. Aortic valve sclerosis/calcification is present, without any evidence of aortic stenosis. FINDINGS  Left Ventricle: Severe asymmetric hypertrophy of the basal septum up to 154m There is evidence of SAM of the MV leaflets with obstruction up to 37 mmHG (variation noted due to afib). 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  Would consider a cardiac MRI for better characterization. This was not well characterized  on the last echocardiogram. Left ventricular ejection fraction, by estimation, is 65 to 70%. The left ventricle has normal function. The left ventricle has no regional wall motion abnormalities. The left ventricular internal cavity size was normal in size. There is severe asymmetric left ventricular hypertrophy of the basal-septal segment. Left ventricular diastolic function could not be evaluated due to atrial fibrillation. Left ventricular diastolic function could not be evaluated. Right Ventricle: The right ventricular size is normal. No increase in right ventricular wall thickness. Right ventricular systolic function is normal. Left Atrium: Left atrial size was mildly dilated. Right Atrium: Right atrial size was mildly dilated. Pericardium: Trivial pericardial effusion is present. Presence of epicardial fat layer. Mitral Valve: The mitral valve is degenerative in appearance. Trivial mitral valve regurgitation. No evidence of mitral valve stenosis. MV peak gradient, 7.3 mmHg. The mean mitral valve gradient is 2.0 mmHg. Tricuspid Valve: The tricuspid valve is  grossly normal. Tricuspid valve regurgitation is mild . No evidence of tricuspid stenosis. Aortic Valve: The aortic valve is tricuspid. There is mild calcification of the aortic valve. There is mild thickening of the aortic valve. Aortic valve regurgitation is not visualized. Aortic valve sclerosis/calcification is present, without any evidence of aortic stenosis. Aortic valve mean gradient measures 7.5 mmHg. Aortic valve peak gradient measures 16.2 mmHg. Aortic valve area, by VTI measures 2.37 cm. Pulmonic Valve: The pulmonic valve was grossly normal. Pulmonic valve regurgitation is not visualized. No evidence of pulmonic stenosis. Aorta: The aortic root and ascending aorta are structurally normal, with no evidence of dilitation. Venous: IVC assessment for right atrial pressure unable to be performed due to mechanical ventilation. IAS/Shunts: The atrial septum is grossly normal.  LEFT VENTRICLE PLAX 2D LVIDd:         3.80 cm LVIDs:         1.80 cm LV PW:         1.30 cm LV IVS:        1.70 cm LVOT diam:     2.00 cm LV SV:         84 LV SV Index:   48 LVOT Area:     3.14 cm  RIGHT VENTRICLE RV Basal diam:  3.70 cm RV Mid diam:    3.10 cm RV S prime:     12.80 cm/s LEFT ATRIUM             Index        RIGHT ATRIUM           Index LA diam:        4.50 cm 2.54 cm/m   RA Area:     18.10 cm LA Vol (A2C):   62.5 ml 35.31 ml/m  RA Volume:   53.70 ml  30.34 ml/m LA Vol (A4C):   62.0 ml 35.03 ml/m LA Biplane Vol: 62.6 ml 35.37 ml/m  AORTIC VALVE                     PULMONIC VALVE AV Area (Vmax):    2.31 cm      PV Vmax:       0.87 m/s AV Area (Vmean):   2.40 cm      PV Peak grad:  3.0 mmHg AV Area (VTI):     2.37 cm AV Vmax:           201.50 cm/s AV Vmean:          123.200 cm/s AV VTI:  0.355 m AV Peak Grad:      16.2 mmHg AV Mean Grad:      7.5 mmHg LVOT Vmax:         148.00 cm/s LVOT Vmean:        94.200 cm/s LVOT VTI:          0.268 m LVOT/AV VTI ratio: 0.75  AORTA Ao Root diam: 3.70 cm Ao Asc diam:   3.40 cm MITRAL VALVE                TRICUSPID VALVE MV Area (PHT): 3.95 cm     TR Peak grad:   35.0 mmHg MV Area VTI:   3.43 cm     TR Vmax:        296.00 cm/s MV Peak grad:  7.3 mmHg MV Mean grad:  2.0 mmHg     SHUNTS MV Vmax:       1.35 m/s     Systemic VTI:  0.27 m MV Vmean:      64.5 cm/s    Systemic Diam: 2.00 cm MV Decel Time: 192 msec MV E velocity: 118.00 cm/s Eleonore Chiquito MD Electronically signed by Eleonore Chiquito MD Signature Date/Time: 09/14/2021/3:29:21 PM    Final    CT Head Wo Contrast  Result Date: 09/13/2021 CLINICAL DATA:  Headache, new or worsening. EXAM: CT HEAD WITHOUT CONTRAST TECHNIQUE: Contiguous axial images were obtained from the base of the skull through the vertex without intravenous contrast. RADIATION DOSE REDUCTION: This exam was performed according to the departmental dose-optimization program which includes automated exposure control, adjustment of the mA and/or kV according to patient size and/or use of iterative reconstruction technique. COMPARISON:  CT examination dated August 04, 2021 FINDINGS: Brain: No evidence of acute infarction, hemorrhage, hydrocephalus, extra-axial collection or mass lesion/mass effect. Low-attenuation of the periventricular and subcortical white matter presumed advanced chronic microvascular ischemic changes. Vascular: No hyperdense vessel or unexpected calcification. Skull: Normal. Negative for fracture or focal lesion. Sinuses/Orbits: Paranasal sinus postsurgical changes. Other: None. IMPRESSION: 1. No acute intracranial abnormality. 2. Advanced chronic microvascular ischemic changes of the supratentorial white matter. Electronically Signed   By: Keane Police D.O.   On: 09/13/2021 23:38   DG Chest Port 1 View  Result Date: 09/13/2021 CLINICAL DATA:  Hypoxia EXAM: PORTABLE CHEST 1 VIEW COMPARISON:  08/29/2021 FINDINGS: Cardiomegaly. Interstitial prominence throughout the lungs, right greater than left. This could reflect interstitial edema or  atypical infection. NG tube is in the stomach. Endotracheal tube is 3 cm above the carina. No effusions or acute bony abnormality. IMPRESSION: Cardiomegaly. Increasing interstitial prominence, right greater than left, favor interstitial edema although atypical infection cannot be excluded. Electronically Signed   By: Rolm Baptise M.D.   On: 09/13/2021 22:11   DG Chest 2 View  Result Date: 08/29/2021 CLINICAL DATA:  Pneumonia, organism unspecified([id number]) EXAM: CHEST - 2 VIEW COMPARISON:  Chest x-ray 08/14/2021 FINDINGS: Enlarged cardiac silhouette. The heart and mediastinal contours are unchanged. Aortic calcification. Interval resolution of left base airspace opacity. No focal consolidation. Chronic coarsened markings with no overt pulmonary edema. No pleural effusion. No pneumothorax. No acute osseous abnormality. Diffuse decreased bone density. Chronic multilevel lower thoracic vertebral body height loss . IMPRESSION: 1. Interval resolution of left base airspace opacity. No active cardiopulmonary disease. 2.  Aortic Atherosclerosis (ICD10-I70.0). Electronically Signed   By: Iven Finn M.D.   On: 08/29/2021 16:32    Orson Eva, DO  [facility]  If 7PM-7AM, please contact night-coverage [email or web address] Password  [email or web address] 09/17/2021, 1:38 PM   LOS: 4 days

## 2021-09-18 DIAGNOSIS — J9601 Acute respiratory failure with hypoxia: Secondary | ICD-10-CM | POA: Diagnosis not present

## 2021-09-18 DIAGNOSIS — G9341 Metabolic encephalopathy: Secondary | ICD-10-CM | POA: Diagnosis not present

## 2021-09-18 DIAGNOSIS — I5031 Acute diastolic (congestive) heart failure: Secondary | ICD-10-CM | POA: Diagnosis not present

## 2021-09-18 DIAGNOSIS — A419 Sepsis, unspecified organism: Secondary | ICD-10-CM | POA: Diagnosis not present

## 2021-09-18 LAB — CULTURE, BLOOD (ROUTINE X 2)
Culture: NO GROWTH
Culture: NO GROWTH

## 2021-09-18 LAB — GLUCOSE, CAPILLARY
Glucose-Capillary: 176 mg/dL — ABNORMAL HIGH (ref 70–99)
Glucose-Capillary: 180 mg/dL — ABNORMAL HIGH (ref 70–99)
Glucose-Capillary: 132 mg/dL — ABNORMAL HIGH (ref 70–99)
Glucose-Capillary: 142 mg/dL — ABNORMAL HIGH (ref 70–99)

## 2021-09-18 MED ORDER — KETOROLAC TROMETHAMINE 15 MG/ML IJ SOLN
15.0000 mg | Freq: Once | INTRAMUSCULAR | Status: AC
  Administered 2021-09-18: 15 mg via INTRAVENOUS
  Filled 2021-09-18: qty 1

## 2021-09-18 NOTE — Progress Notes (Signed)
PROGRESS NOTE  [name] EHO:[id number] DOB: 06/23/1945 DOA: 09/13/2021 PCP: Carrolyn Meiers, MD  Brief History:  76 year old female with a history of schizophrenia, hypertension, diabetes mellitus type 2, DVT, anxiety, and atrial fibrillation presenting with altered mental status and hypoxia.  The patient normally resides at [facility]. Unfortunately patient is not able to provide any history at all as she is on the vent and sedated.  She was brought in by facility staff that reported she was less responsive and having difficulty breathing.  Also noted her to have a fever.  In the ED patient was not protecting her airway and was quite hypoxic despite nasal cannula.  She was intubated.  Work-up revealed a UTI and interstitial edema versus atypical infection on chest x-ray.  CT head did not show any abnormality.  Patient had 2 L bolus, and continued to be hypotensive so she was started on Levophed.  Admission requested for septic shock. PCCM consulted to assist with management. Echo showed EF 65-70% with HOCM.  Cardiology was consulted to assist with management.     Assessment and Plan: * Septic shock (HCC) - Fever 100.6, tachycardia 116, respiratory rate 25, blood pressure 70/43, acute respiratory failure with an oxygen sat of 78%, leukocytosis 17, lactic acidosis 2.3 - Secondary to UTI  - Continue IV antibiotics - Patient is requiring 7 mcg of Levophed>>weaned off 8/24 afternoon - This is after receiving her 30 mL/kg bolus - initially started cefepime and doxycycline>>ceftriaxone Sepsis physiology now resolved  Schizophrenia ([place]) Continue  Risperdal, Remeron, Cymbalta--restarted Continue clonazepam--restarted  Uncontrolled type 2 diabetes mellitus with hyperglycemia, with long-term current use of insulin (HCC) Continue Levemir 10 units hx Continue NovoLog sliding scale 03/22/2021 hemoglobin A1c 7.1 08/19/21 A1C--6.2  GERD (gastroesophageal reflux  disease) - Continue Protonix  Acute respiratory failure with hypoxia (HCC) - Secondary to pulmonary edema - Chest x-ray shows interstitial pulmonary edema versus atypical infection- patient was discharged July 23 after admission from pneumonia, those infiltrates should be resolved by now - trach aspirate culture S.pneumoniae -appreciate PCCM--discussed with Dr. Elsworth Soho --extubated 09/15/21 afternoon --MRSA screen is positive --now remains stable on RA  Acute diastolic CHF (congestive heart failure) ([place]) Continue IV lasix>>stop as pt more euvolemic now with HOCM --remains euvolemic 10/15/19 Echo EF 60-65%, no WMA, mild MR Repeat Echo EF 65-70%, +HOCM, no WMA Accurate I/O--6.1L out last 72 hours Cardiology consult appreciated  UTI (urinary tract infection) -UA >50 WBC - Urine culture MDR Ecoli - Blood culture neg to date - continue nitrofurantoin  Acute tracheobronchitis Trach aspirate = S. pneuomoniae Continue ceftriaxone  DVT, lower extremity, recurrent (HCC) On [place] lovenox currently>>plan to transition back to apixaban   Stricture and stenosis of esophagus 07/23/2021 EGD--showed food in the middle third and lower third of the esophagus.  The patient was subsequently debated during the procedure secondary to aspiration risk.  90% of the retained food was removed with 10% pushed into the stomach.  A distal esophageal stricture was noted. -Continue full liquid diet once extubated -Continues to have dysphagia -07/25/21 barium swallow>>Nonspecific esophageal dysmotility disorder, with termination of primary peristaltic waves in the mid esophagus, and tertiary contractions in the esophagus Continue IV pantoprazole twice daily Tolerating dys 1 diet  Opioid dependence ([place]) PDMP reviewed Norco 10/325, #90, last refilled on 09/01/21, 08/03/21 Clonazepam 0.5 mg, #90, last filled on 09/08/21, 08/26/21  Essential hypertension Holding dilitazem and metoprolol succinate due to hypotension  initially --restart  metoprolol succinate now that BPs improving  Goals of care, counseling/discussion Discussed with patient's Legal Guardian, [name] --agrees with DNR  Pressure injury of skin Stage 1 on buttock and left hip Present on admission Local care  IV infiltration IV with levophed infiltrated in right arm --given phentolamine --no signs of necrosis presently  Acute metabolic encephalopathy - obtunded in ED initially - CT head shows no acute intracranial abnormality - Patient was hypotensive which is likely contributing at 70/43, but also hypoxic as she was not protecting her airway at 78% - intubated in ED to protect airway - Initial pH 7.3, PCO2 33 -  altered mental status secondary to sepsis, hypoxia and hypotension - overall improved, back to baseline   Paroxysmal atrial fibrillation (HCC) Continue lovenox for [facility] with plans to switch back to apixaban Continue amiodarone Holding metoprolol succinate and diltiazem CD due to hypotension initially Having some RVR as expected Increase metoprolol succinate to 100 mg daily    Family Communication:  no Family at bedside  Consultants:  cardiology, pulm  Code Status:  DNR  DVT Prophylaxis:  apixaban   Procedures: As Listed in Progress Note Above  Antibiotics: Cefepime 8/22>>8/25 Ceftriaxone 8/2%>> Doxy 8/22>>8/25 Vanc 8/22>> 8/25 Nitrofurantoin 8/26>>       Subjective: Patient denies fevers, chills, headache, chest pain, dyspnea, nausea, vomiting, diarrhea, abdominal pain, dysuria, hematuria, hematochezia, and melena.   Objective: Vitals:   09/18/21 0740 09/18/21 0800 09/18/21 0900 09/18/21 1000  BP:  (!) 164/81 (!) 152/92 (!) 140/89  Pulse:  99    Resp:  12  16  Temp: 97.8 F (36.6 C)     TempSrc: Oral  Oral   SpO2:  100%    Weight:      Height:        Intake/Output Summary (Last 24 hours) at 09/18/2021 1103 Last data filed at 09/18/2021 0824 Gross per 24 hour  Intake 580.06 ml   Output 1150 ml  Net -569.94 ml   Weight change: -0.9 kg Exam:  General:  Pt is alert, follows commands appropriately, not in acute distress HEENT: No icterus, No thrush, No neck mass, [place]/AT Cardiovascular: RRR, S1/S2, no rubs, no gallops Respiratory: bibasilar crackles. No wheeze Abdomen: Soft/+BS, non tender, non distended, no guarding Extremities: No edema, No lymphangitis, No petechiae, No rashes, no synovitis   Data Reviewed: I have personally reviewed following labs and imaging studies Basic Metabolic Panel: Recent Labs  Lab 09/13/21 2121 09/14/21 0310 09/15/21 0253 09/16/21 0553 09/17/21 0404  NA 134* 135 133* 135 138  K 4.1 4.0 2.9* 3.8 4.0  CL 98 103 97* 99 102  CO2 '25 25 27 29 27  '$ GLUCOSE 158* 196* 153* 157* 148*  BUN '17 15 11 13 19  '$ CREATININE 0.99 0.79 0.67 0.63 0.64  CALCIUM 8.3* 7.7* 7.9* 8.4* 8.7*  MG  --  1.8 1.9 2.0 2.2  PHOS  --  3.8  --   --   --    Liver Function Tests: Recent Labs  Lab 09/13/21 2121 09/14/21 0310  AST 23 19  ALT 19 16  ALKPHOS 84 64  BILITOT 0.6 1.0  PROT 7.5 6.0*  ALBUMIN 3.5 2.8*   No results for input(s): "LIPASE", "AMYLASE" in the last 168 hours. No results for input(s): "AMMONIA" in the last 168 hours. Coagulation Profile: Recent Labs  Lab 09/13/21 2121 09/14/21 0310  INR 1.6* 1.6*   CBC: Recent Labs  Lab 09/13/21 2121 09/14/21 0310 09/15/21 0253 09/16/21 0553 09/17/21 0725  WBC 17.1* 15.7* 16.6* 13.5* 5.2  NEUTROABS 15.0* 13.6*  --   --   --   HGB 10.0* 9.4* 10.3* 11.2* 10.6*  HCT 31.3* 29.5* 31.5* 34.2* 32.7*  MCV 86.9 87.0 84.5 84.2 84.3  PLT 273 216 231 251 264   Cardiac Enzymes: No results for input(s): "CKTOTAL", "CKMB", "CKMBINDEX", "TROPONINI" in the last 168 hours. BNP: Invalid input(s): "POCBNP" CBG: Recent Labs  Lab 09/17/21 1058 09/17/21 1618 09/17/21 2009 09/17/21 2223 09/18/21 0704  GLUCAP 124* 106* 146* 124* 176*   HbA1C: No results for input(s): "HGBA1C" in the last 72  hours. Urine analysis:    Component Value Date/Time   COLORURINE YELLOW 09/13/2021 2212   APPEARANCEUR CLOUDY (A) 09/13/2021 2212   APPEARANCEUR Cloudy (A) 05/01/2016 1525   LABSPEC 1.017 09/13/2021 2212   PHURINE 6.0 09/13/2021 2212   GLUCOSEU NEGATIVE 09/13/2021 2212   HGBUR NEGATIVE 09/13/2021 2212   BILIRUBINUR NEGATIVE 09/13/2021 2212   BILIRUBINUR Negative 05/01/2016 1525   KETONESUR NEGATIVE 09/13/2021 2212   PROTEINUR 30 (A) 09/13/2021 2212   UROBILINOGEN 0.2 09/14/2014 1703   NITRITE POSITIVE (A) 09/13/2021 2212   LEUKOCYTESUR LARGE (A) 09/13/2021 2212   Sepsis Labs: '@LABRCNTIP'$ (procalcitonin:4,lacticidven:4) ) Recent Results (from the past 240 hour(s))  Resp Panel by RT-PCR (Flu A&B, Covid) Anterior Nasal Swab     Status: None   Collection Time: 09/13/21  9:20 PM   Specimen: Anterior Nasal Swab  Result Value Ref Range Status   SARS Coronavirus 2 by RT PCR NEGATIVE NEGATIVE Final    Comment: (NOTE) SARS-CoV-2 target nucleic acids are NOT DETECTED.  The SARS-CoV-2 RNA is generally detectable in upper respiratory specimens during the acute phase of infection. The lowest concentration of SARS-CoV-2 viral copies this assay can detect is 138 copies/mL. A negative result does not preclude SARS-Cov-2 infection and should not be used as the sole basis for treatment or other patient management decisions. A negative result may occur with  improper specimen collection/handling, submission of specimen other than nasopharyngeal swab, presence of viral mutation(s) within the areas targeted by this assay, and inadequate number of viral copies(<138 copies/mL). A negative result must be combined with clinical observations, patient history, and epidemiological information. The expected result is Negative.  Fact Sheet for Patients:  EntrepreneurPulse.com.au  Fact Sheet for Healthcare Providers:  IncredibleEmployment.be  This test is no t yet  approved or cleared by the [place] FDA and  has been authorized for detection and/or diagnosis of SARS-CoV-2 by FDA under an Emergency Use Authorization (EUA). This EUA will remain  in effect (meaning this test can be used) for the duration of the COVID-19 declaration under Section 564(b)(1) of the Act, 21 U.S.C.section 360bbb-3(b)(1), unless the authorization is terminated  or revoked sooner.       Influenza A by PCR NEGATIVE NEGATIVE Final   Influenza B by PCR NEGATIVE NEGATIVE Final    Comment: (NOTE) The Xpert Xpress SARS-CoV-2/FLU/RSV plus assay is intended as an aid in the diagnosis of influenza from Nasopharyngeal swab specimens and should not be used as a sole basis for treatment. Nasal washings and aspirates are unacceptable for Xpert Xpress SARS-CoV-2/FLU/RSV testing.  Fact Sheet for Patients: EntrepreneurPulse.com.au  Fact Sheet for Healthcare Providers: IncredibleEmployment.be  This test is not yet approved or cleared by the [place] FDA and has been authorized for detection and/or diagnosis of SARS-CoV-2 by FDA under an Emergency Use Authorization (EUA). This EUA will remain in effect (meaning this test can be used) for the duration  of the COVID-19 declaration under Section 564(b)(1) of the Act, 21 U.S.C. section 360bbb-3(b)(1), unless the authorization is terminated or revoked.  Performed at [facility], [address] [place], [place] [address]   Culture, blood (routine x 2)     Status: None (Preliminary result)   Collection Time: 09/13/21  9:20 PM   Specimen: BLOOD RIGHT HAND  Result Value Ref Range Status   Specimen Description BLOOD RIGHT HAND  Final   Special Requests   Final    BOTTLES DRAWN AEROBIC AND ANAEROBIC Blood Culture results may not be optimal due to an inadequate volume of blood received in culture bottles   Culture   Final    NO GROWTH 4 DAYS Performed at [facility], [address],  [place], [place] [address]    Report Status PENDING  Incomplete  Urine Culture     Status: Abnormal   Collection Time: 09/13/21 10:12 PM   Specimen: Urine, Clean Catch  Result Value Ref Range Status   Specimen Description   Final    URINE, CLEAN CATCH Performed at [facility], [address], [place], [place] [address]    Special Requests   Final    NONE Performed at [facility], [address], [place], [place] [address]    Culture (A)  Final    >=100,000 COLONIES/mL ESCHERICHIA COLI Susceptibility Pattern Suggests Possibility of an Extended Spectrum Beta Lactamase Producer. Contact Laboratory Within 7 Days if Confirmation Warranted. MULTI-DRUG RESISTANT ORGANISM CRITICAL RESULT CALLED TO, READ BACK BY AND VERIFIED WITH: Merrilee Seashore COFFEE [id number] AT 1138 BY CM Performed at [facility], [place] [address] [place], [place] [address]    Report Status 09/17/2021 FINAL  Final   Organism ID, Bacteria ESCHERICHIA COLI (A)  Final      Susceptibility   Escherichia coli - MIC*    AMPICILLIN >=32 RESISTANT Resistant     CEFAZOLIN >=64 RESISTANT Resistant     CEFEPIME >=32 RESISTANT Resistant     CEFTRIAXONE >=64 RESISTANT Resistant     CIPROFLOXACIN >=4 RESISTANT Resistant     GENTAMICIN >=16 RESISTANT Resistant     IMIPENEM RESISTANT Resistant     NITROFURANTOIN <=16 SENSITIVE Sensitive     TRIMETH/SULFA >=320 RESISTANT Resistant     AMPICILLIN/SULBACTAM >=32 RESISTANT Resistant     PIP/TAZO 64 INTERMEDIATE Intermediate     * >=100,000 COLONIES/mL ESCHERICHIA COLI  Culture, blood (routine x 2)     Status: None (Preliminary result)   Collection Time: 09/13/21 11:01 PM   Specimen: BLOOD  Result Value Ref Range Status   Specimen Description BLOOD  Final   Special Requests NONE  Final   Culture   Final    NO GROWTH 4 DAYS Performed at [facility], [address], [place], [place] [address]    Report Status PENDING  Incomplete  MRSA Next Gen by PCR, Nasal     Status: Abnormal    Collection Time: 09/14/21  1:50 AM   Specimen: Nasal Mucosa; Nasal Swab  Result Value Ref Range Status   MRSA by PCR Next Gen DETECTED (A) NOT DETECTED Final    Comment: RESULT CALLED TO, READ BACK BY AND VERIFIED WITH: SUPPER,B'@0507'$  BY MATTHEWS, B 8.23.23 (NOTE) The GeneXpert MRSA Assay (FDA approved for NASAL specimens only), is one component of a comprehensive MRSA colonization surveillance program. It is not intended to diagnose MRSA infection nor to guide or monitor treatment for MRSA infections. Test performance is not FDA approved in patients  less than 39 years old. Performed at [facility], [address] [place], [place] [address]   Expectorated Sputum Assessment w Gram Stain, Rflx to Resp Cult     Status: None (Preliminary result)   Collection Time: 09/14/21  2:48 AM   Specimen: Expectorated Sputum  Result Value Ref Range Status   Specimen Description   Final    EXPECTORATED SPUTUM Performed at [facility], [address] [place], [place] [address]    Special Requests   Final    NONE Performed at [facility], [address] [place], [place] [address]    Sputum evaluation   Final    THIS SPECIMEN IS ACCEPTABLE FOR SPUTUM CULTURE Performed at [facility], [place] [address], [place], [place] [address]    Report Status PENDING  Incomplete  Culture, Respiratory w Gram Stain     Status: None   Collection Time: 09/14/21  2:48 AM  Result Value Ref Range Status   Specimen Description EXPECTORATED SPUTUM  Final   Special Requests NONE Reflexed from [id number]  Final   Gram Stain   Final    MODERATE WBC PRESENT,BOTH PMN AND MONONUCLEAR FEW GRAM POSITIVE COCCI IN PAIRS RARE GRAM POSITIVE RODS Performed at [facility], [place] [address] [place], [place] [address]    Culture ABUNDANT STREPTOCOCCUS PNEUMONIAE  Final   Report Status 09/16/2021 FINAL  Final   Organism ID, Bacteria STREPTOCOCCUS PNEUMONIAE  Final      Susceptibility   Streptococcus pneumoniae - MIC*     ERYTHROMYCIN 2 RESISTANT Resistant     LEVOFLOXACIN 0.5 SENSITIVE Sensitive     VANCOMYCIN 0.5 SENSITIVE Sensitive     PENICILLIN (meningitis) 0.25 RESISTANT Resistant     PENO - penicillin 0.25      PENICILLIN (non-meningitis) 0.25 SENSITIVE Sensitive     PENICILLIN (oral) 0.25 INTERMEDIATE Intermediate     CEFTRIAXONE (non-meningitis) 0.25 SENSITIVE Sensitive     CEFTRIAXONE (meningitis) 0.25 SENSITIVE Sensitive     * ABUNDANT STREPTOCOCCUS PNEUMONIAE  Carbapenem Resistance Panel     Status: None   Collection Time: 09/14/21 10:16 AM  Result Value Ref Range Status   Carba Resistance IMP Gene NOT DETECTED NOT DETECTED Final   Carba Resistance VIM Gene NOT DETECTED NOT DETECTED Final   Carba Resistance NDM Gene NOT DETECTED NOT DETECTED Final   Carba Resistance KPC Gene NOT DETECTED NOT DETECTED Final   Carba Resistance OXA48 Gene NOT DETECTED NOT DETECTED Final    Comment: (NOTE) Cepheid Carba-R is an FDA-cleared nucleic acid amplification test  (NAAT)for the detection and differentiation of genes encoding the  most prevalent carbapenemases in bacterial isolate samples. Carbapenemase gene identification and implementation of comprehensive  infection control measures are recommended by the CDC to prevent the  spread of the resistant organisms. Performed at [facility], [place] [address] [place],  [address]      Scheduled Meds:  amiodarone  200 mg Oral Daily   apixaban  5 mg Oral BID   ARIPiprazole  2 mg Oral Daily   busPIRone  7.5 mg Oral BID   Chlorhexidine Gluconate Cloth  6 each Topical Q0600   clonazePAM  0.5 mg Oral TID   DULoxetine  60 mg Oral Daily   insulin aspart  0-5 Units Subcutaneous QHS   insulin aspart  0-9 Units Subcutaneous TID WC   insulin detemir  10 Units Subcutaneous QHS   metoprolol succinate  100 mg Oral Daily   midodrine  10 mg Oral TID WC   mupirocin ointment   Nasal BID   nitrofurantoin (macrocrystal-monohydrate)  100 mg Oral Q12H    pantoprazole (PROTONIX) IV  40 mg Intravenous Q12H   potassium chloride  40 mEq Oral BID   Continuous Infusions:  cefTRIAXone (ROCEPHIN)  IV Stopped (09/17/21 1741)    Procedures/Studies: [place] EKG SITE RITE  Result Date: 09/15/2021 If Site Rite image not attached, placement could not be confirmed due to current cardiac rhythm.  ECHOCARDIOGRAM COMPLETE  Result Date: 09/14/2021    ECHOCARDIOGRAM REPORT   Patient Name:   ZHANNA MELIN Date of Exam: 09/14/2021 Medical Rec #:  [id number]       Height:       67.0 in Accession #:    [id number]      Weight:       146.2 lb Date of Birth:  [birth year]       BSA:          1.770 m Patient Age:    81 years        BP:           129/67 mmHg Patient Gender: F               HR:           84 bpm. Exam Location:  [facility] Procedure: 2D Echo, Cardiac Doppler and Color Doppler Indications:    CHF  History:        Patient has prior history of Echocardiogram examinations, most                 recent 10/05/2019. CHF, Stroke, Arrythmias:Atrial Fibrillation,                 Signs/Symptoms:Altered Mental Status; Risk Factors:Hypertension,                 Diabetes and Former Smoker.  Sonographer:    Wenda Low Referring Phys: [phone] Bobi Daudelin  Sonographer Comments: Echo performed with patient supine and on artificial respirator. IMPRESSIONS  1. Severe asymmetric hypertrophy of the basal septum up to 35m. There is evidence of SAM of the MV leaflets with obstruction up to 37 mmHG (variation noted due to afib). 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 Would consider a cardiac MRI for better characterization. This was not well characterized on the last echocardiogram. Left ventricular ejection fraction, by estimation, is 65 to 70%. The left ventricle has normal function. The left ventricle has no regional wall motion abnormalities. There is severe asymmetric left ventricular hypertrophy of the basal-septal segment. Left ventricular diastolic function  could not be evaluated.  2. Right ventricular systolic function is normal. The right ventricular size is normal.  3. Left atrial size was mildly dilated.  4. Right atrial size was mildly dilated.  5. The mitral valve is degenerative. Trivial mitral valve regurgitation. No evidence of mitral stenosis.  6. The aortic valve is tricuspid. There is mild calcification of the aortic valve. There is mild thickening of the aortic valve. Aortic valve regurgitation is not visualized. Aortic valve sclerosis/calcification is present, without any evidence of aortic stenosis. FINDINGS  Left Ventricle: Severe asymmetric hypertrophy of the basal septum up to 1104m There is evidence of SAM of the MV leaflets with obstruction up to 37 mmHG (variation noted due to afib). 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  Would consider a cardiac MRI for better characterization. This was not well characterized on the last echocardiogram. Left ventricular ejection fraction,  by estimation, is 65 to 70%. The left ventricle has normal function. The left ventricle has no regional wall motion abnormalities. The left ventricular internal cavity size was normal in size. There is severe asymmetric left ventricular hypertrophy of the basal-septal segment. Left ventricular diastolic function could not be evaluated due to atrial fibrillation. Left ventricular diastolic function could not be evaluated. Right Ventricle: The right ventricular size is normal. No increase in right ventricular wall thickness. Right ventricular systolic function is normal. Left Atrium: Left atrial size was mildly dilated. Right Atrium: Right atrial size was mildly dilated. Pericardium: Trivial pericardial effusion is present. Presence of epicardial fat layer. Mitral Valve: The mitral valve is degenerative in appearance. Trivial mitral valve regurgitation. No evidence of mitral valve stenosis. MV peak gradient, 7.3 mmHg. The mean mitral valve gradient  is 2.0 mmHg. Tricuspid Valve: The tricuspid valve is grossly normal. Tricuspid valve regurgitation is mild . No evidence of tricuspid stenosis. Aortic Valve: The aortic valve is tricuspid. There is mild calcification of the aortic valve. There is mild thickening of the aortic valve. Aortic valve regurgitation is not visualized. Aortic valve sclerosis/calcification is present, without any evidence of aortic stenosis. Aortic valve mean gradient measures 7.5 mmHg. Aortic valve peak gradient measures 16.2 mmHg. Aortic valve area, by VTI measures 2.37 cm. Pulmonic Valve: The pulmonic valve was grossly normal. Pulmonic valve regurgitation is not visualized. No evidence of pulmonic stenosis. Aorta: The aortic root and ascending aorta are structurally normal, with no evidence of dilitation. Venous: IVC assessment for right atrial pressure unable to be performed due to mechanical ventilation. IAS/Shunts: The atrial septum is grossly normal.  LEFT VENTRICLE PLAX 2D LVIDd:         3.80 cm LVIDs:         1.80 cm LV PW:         1.30 cm LV IVS:        1.70 cm LVOT diam:     2.00 cm LV SV:         84 LV SV Index:   48 LVOT Area:     3.14 cm  RIGHT VENTRICLE RV Basal diam:  3.70 cm RV Mid diam:    3.10 cm RV S prime:     12.80 cm/s LEFT ATRIUM             Index        RIGHT ATRIUM           Index LA diam:        4.50 cm 2.54 cm/m   RA Area:     18.10 cm LA Vol (A2C):   62.5 ml 35.31 ml/m  RA Volume:   53.70 ml  30.34 ml/m LA Vol (A4C):   62.0 ml 35.03 ml/m LA Biplane Vol: 62.6 ml 35.37 ml/m  AORTIC VALVE                     PULMONIC VALVE AV Area (Vmax):    2.31 cm      PV Vmax:       0.87 m/s AV Area (Vmean):   2.40 cm      PV Peak grad:  3.0 mmHg AV Area (VTI):     2.37 cm AV Vmax:           201.50 cm/s AV Vmean:          123.200 cm/s AV VTI:            0.355 m AV  Peak Grad:      16.2 mmHg AV Mean Grad:      7.5 mmHg LVOT Vmax:         148.00 cm/s LVOT Vmean:        94.200 cm/s LVOT VTI:          0.268 m LVOT/AV VTI  ratio: 0.75  AORTA Ao Root diam: 3.70 cm Ao Asc diam:  3.40 cm MITRAL VALVE                TRICUSPID VALVE MV Area (PHT): 3.95 cm     TR Peak grad:   35.0 mmHg MV Area VTI:   3.43 cm     TR Vmax:        296.00 cm/s MV Peak grad:  7.3 mmHg MV Mean grad:  2.0 mmHg     SHUNTS MV Vmax:       1.35 m/s     Systemic VTI:  0.27 m MV Vmean:      64.5 cm/s    Systemic Diam: 2.00 cm MV Decel Time: 192 msec MV E velocity: 118.00 cm/s Eleonore Chiquito MD Electronically signed by Eleonore Chiquito MD Signature Date/Time: 09/14/2021/3:29:21 PM    Final    CT Head Wo Contrast  Result Date: 09/13/2021 CLINICAL DATA:  Headache, new or worsening. EXAM: CT HEAD WITHOUT CONTRAST TECHNIQUE: Contiguous axial images were obtained from the base of the skull through the vertex without intravenous contrast. RADIATION DOSE REDUCTION: This exam was performed according to the departmental dose-optimization program which includes automated exposure control, adjustment of the mA and/or kV according to patient size and/or use of iterative reconstruction technique. COMPARISON:  CT examination dated August 04, 2021 FINDINGS: Brain: No evidence of acute infarction, hemorrhage, hydrocephalus, extra-axial collection or mass lesion/mass effect. Low-attenuation of the periventricular and subcortical white matter presumed advanced chronic microvascular ischemic changes. Vascular: No hyperdense vessel or unexpected calcification. Skull: Normal. Negative for fracture or focal lesion. Sinuses/Orbits: Paranasal sinus postsurgical changes. Other: None. IMPRESSION: 1. No acute intracranial abnormality. 2. Advanced chronic microvascular ischemic changes of the supratentorial white matter. Electronically Signed   By: Keane Police D.O.   On: 09/13/2021 23:38   DG Chest Port 1 View  Result Date: 09/13/2021 CLINICAL DATA:  Hypoxia EXAM: PORTABLE CHEST 1 VIEW COMPARISON:  08/29/2021 FINDINGS: Cardiomegaly. Interstitial prominence throughout the lungs, right greater  than left. This could reflect interstitial edema or atypical infection. NG tube is in the stomach. Endotracheal tube is 3 cm above the carina. No effusions or acute bony abnormality. IMPRESSION: Cardiomegaly. Increasing interstitial prominence, right greater than left, favor interstitial edema although atypical infection cannot be excluded. Electronically Signed   By: Rolm Baptise M.D.   On: 09/13/2021 22:11   DG Chest 2 View  Result Date: 08/29/2021 CLINICAL DATA:  Pneumonia, organism unspecified([id number]) EXAM: CHEST - 2 VIEW COMPARISON:  Chest x-ray 08/14/2021 FINDINGS: Enlarged cardiac silhouette. The heart and mediastinal contours are unchanged. Aortic calcification. Interval resolution of left base airspace opacity. No focal consolidation. Chronic coarsened markings with no overt pulmonary edema. No pleural effusion. No pneumothorax. No acute osseous abnormality. Diffuse decreased bone density. Chronic multilevel lower thoracic vertebral body height loss . IMPRESSION: 1. Interval resolution of left base airspace opacity. No active cardiopulmonary disease. 2.  Aortic Atherosclerosis (ICD10-I70.0). Electronically Signed   By: Iven Finn M.D.   On: 08/29/2021 16:32    Orson Eva, DO  [facility]  If 7PM-7AM, please contact night-coverage [email or web address] Password [facility] 09/18/2021, 11:03  AM   LOS: 5 days

## 2021-09-19 DIAGNOSIS — G9341 Metabolic encephalopathy: Secondary | ICD-10-CM | POA: Diagnosis not present

## 2021-09-19 DIAGNOSIS — I5031 Acute diastolic (congestive) heart failure: Secondary | ICD-10-CM | POA: Diagnosis not present

## 2021-09-19 DIAGNOSIS — J9601 Acute respiratory failure with hypoxia: Secondary | ICD-10-CM | POA: Diagnosis not present

## 2021-09-19 DIAGNOSIS — A419 Sepsis, unspecified organism: Secondary | ICD-10-CM | POA: Diagnosis not present

## 2021-09-19 DIAGNOSIS — I421 Obstructive hypertrophic cardiomyopathy: Secondary | ICD-10-CM

## 2021-09-19 LAB — MAGNESIUM: Magnesium: 2.1 mg/dL (ref 1.7–2.4)

## 2021-09-19 LAB — CBC
HCT: 37 % (ref 36.0–46.0)
Hemoglobin: 12 g/dL (ref 12.0–15.0)
MCH: 27.5 pg (ref 26.0–34.0)
MCHC: 32.4 g/dL (ref 30.0–36.0)
MCV: 84.7 fL (ref 80.0–100.0)
Platelets: 296 10*3/uL (ref 150–400)
RBC: 4.37 MIL/uL (ref 3.87–5.11)
RDW: 14.4 % (ref 11.5–15.5)
WBC: 5.5 10*3/uL (ref 4.0–10.5)
nRBC: 0 % (ref 0.0–0.2)

## 2021-09-19 LAB — GLUCOSE, CAPILLARY
Glucose-Capillary: 166 mg/dL — ABNORMAL HIGH (ref 70–99)
Glucose-Capillary: 163 mg/dL — ABNORMAL HIGH (ref 70–99)

## 2021-09-19 LAB — BASIC METABOLIC PANEL
Anion gap: 8 (ref 5–15)
BUN: 15 mg/dL (ref 8–23)
CO2: 26 mmol/L (ref 22–32)
Calcium: 8.9 mg/dL (ref 8.9–10.3)
Chloride: 102 mmol/L (ref 98–111)
Creatinine, Ser: 0.59 mg/dL (ref 0.44–1.00)
GFR, Estimated: >60 mL/min (ref >60)
Glucose, Bld: 163 mg/dL — ABNORMAL HIGH (ref 70–99)
Potassium: 4.2 mmol/L (ref 3.5–5.1)
Sodium: 136 mmol/L (ref 135–145)

## 2021-09-19 LAB — URINE CULTURE: Culture: >=100000 — AB

## 2021-09-19 IMAGING — CT CT ABD-PELV W/ CM
2 of 5 series · 15 of 46 positions shown, 17 images · IV contrast (Omnipaque or Isovue)
Comparison: 03/28/2017

CLINICAL DATA: Nausea, vomiting, abdominal pain

EXAM:
CT ABDOMEN AND PELVIS WITH CONTRAST
TECHNIQUE: Multidetector CT imaging of the abdomen and pelvis was performed
using the standard protocol following bolus administration of
intravenous contrast.
CONTRAST:  100mL OMNIPAQUE IOHEXOL 300 MG/ML  SOLN

[Series 2: axial st · axial · 0.86mm/px · z∈[+1275,+1680]mm · 12 of 91 slices shown, 14 images]
[im 5/91  soft-tissue]
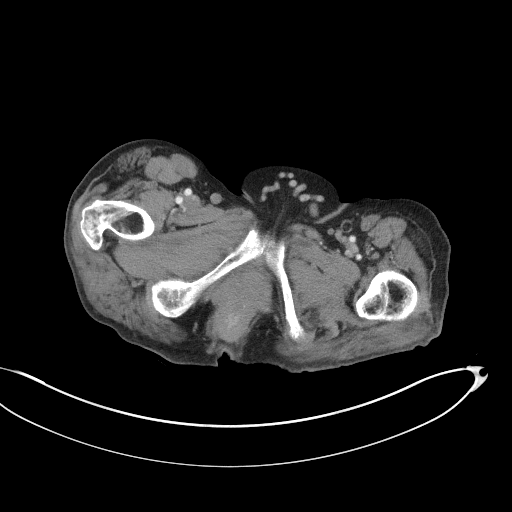
[im 5/91  bone]
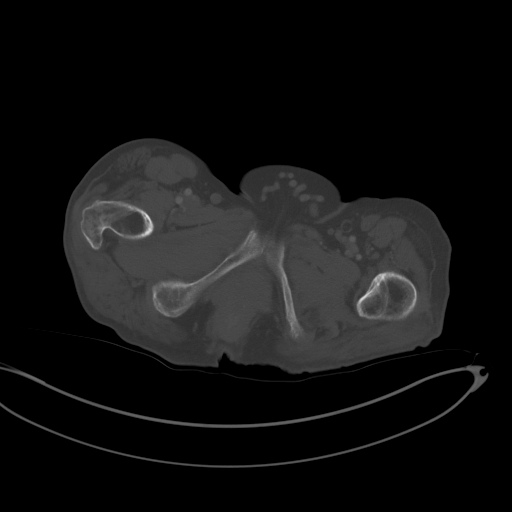
[im 15/91  soft-tissue]
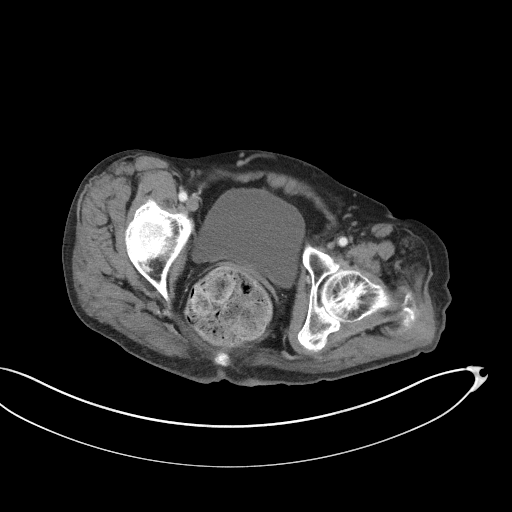
[im 19/91  soft-tissue]
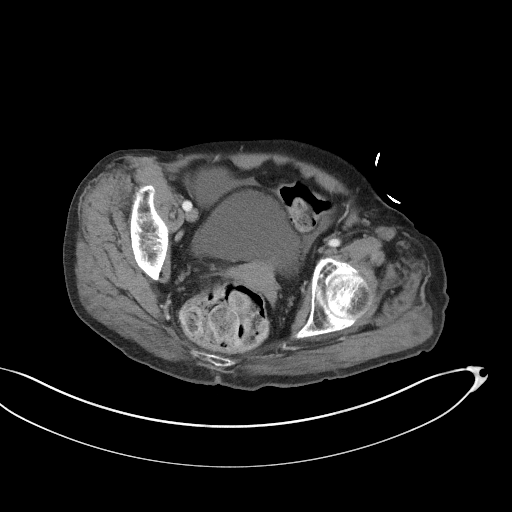
[im 29/91  soft-tissue]
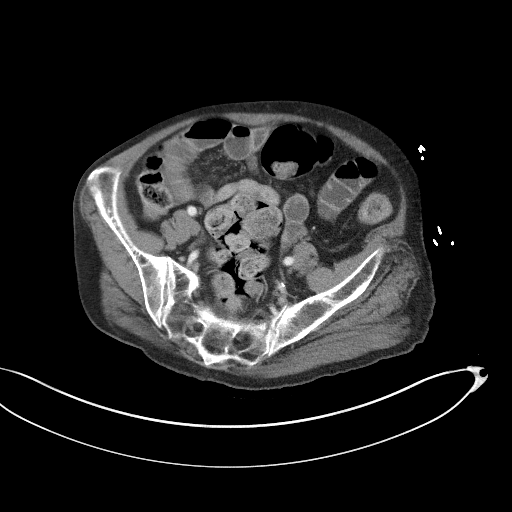
[im 34/91  soft-tissue]
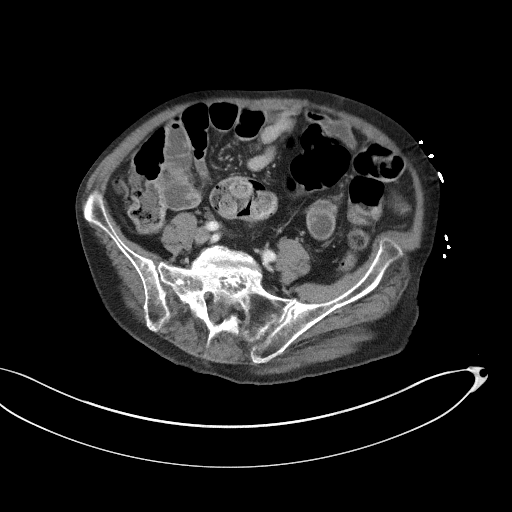
[im 43/91  soft-tissue]
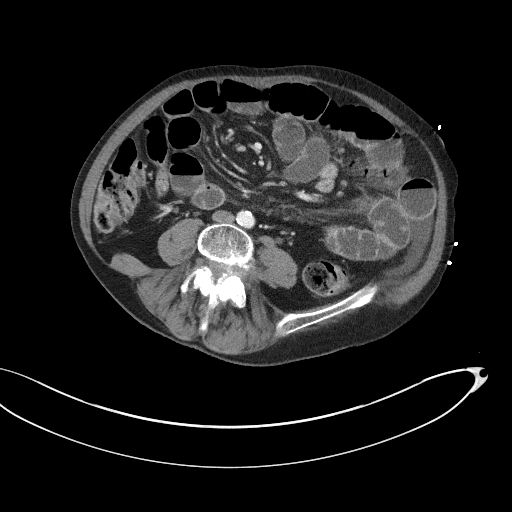
[im 48/91  soft-tissue]
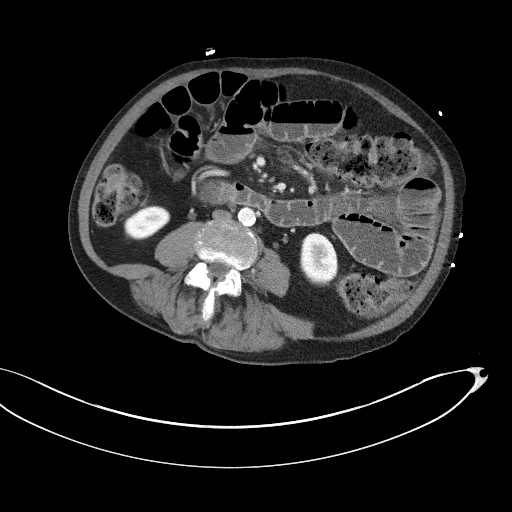
[im 57/91  soft-tissue]
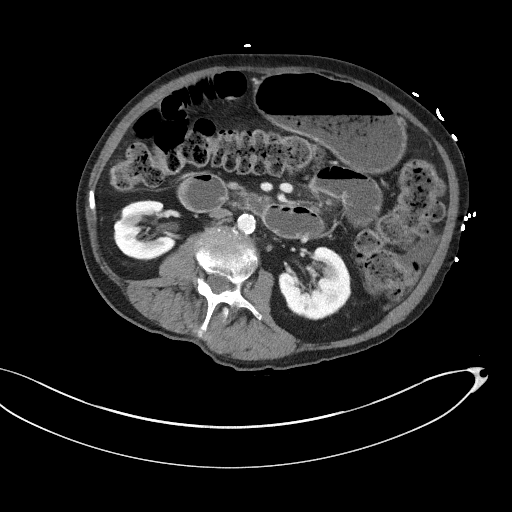
[im 62/91  soft-tissue]
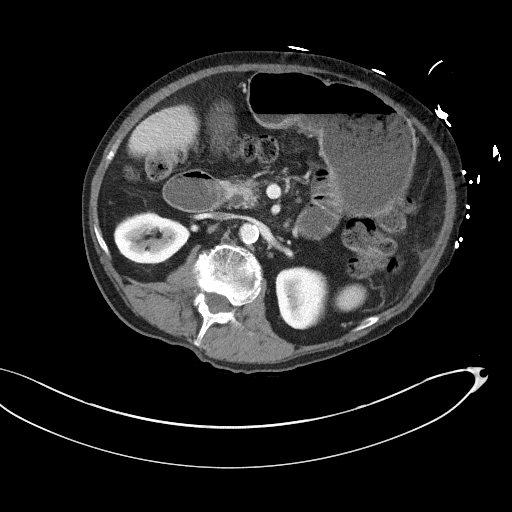
[im 62/91  bone]
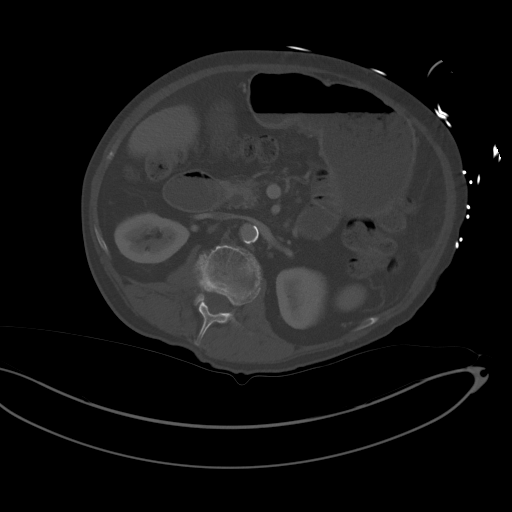
[im 72/91  soft-tissue]
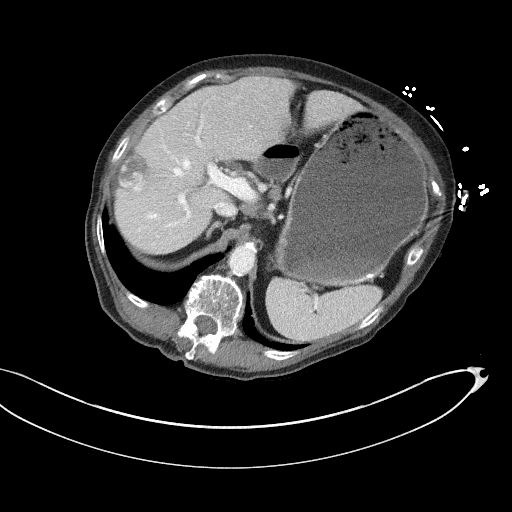
[im 76/91  soft-tissue]
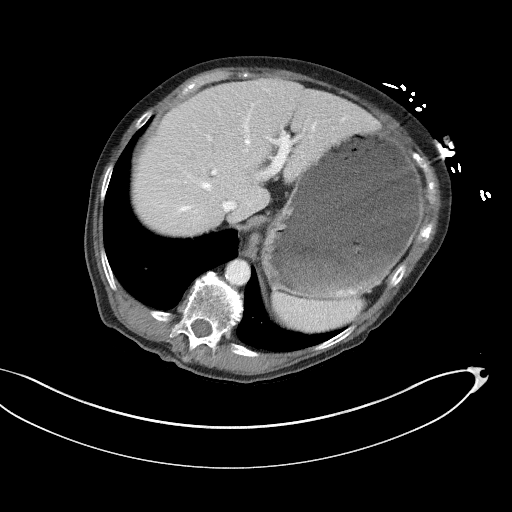
[im 86/91  soft-tissue]
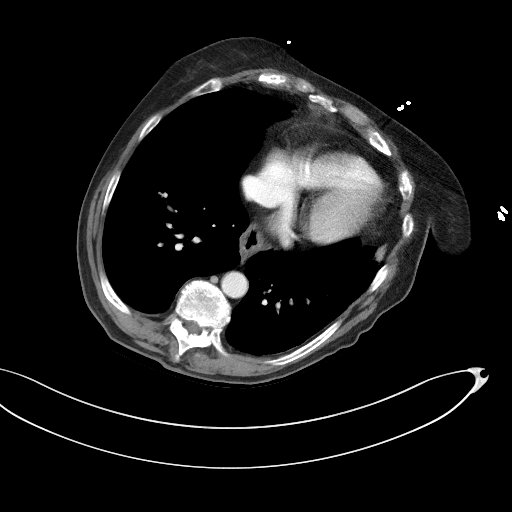

[Series 5: coronal st · coronal · 0.70mm/px · 3 of 111 slices shown]
[im 37/111  soft-tissue]
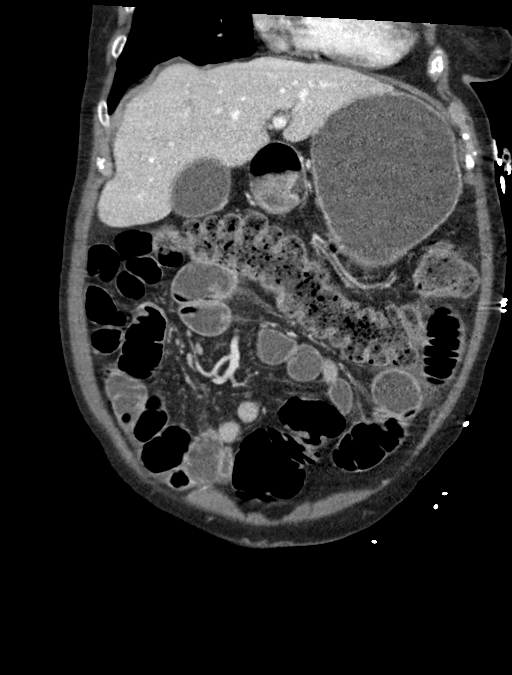
[im 49/111  soft-tissue]
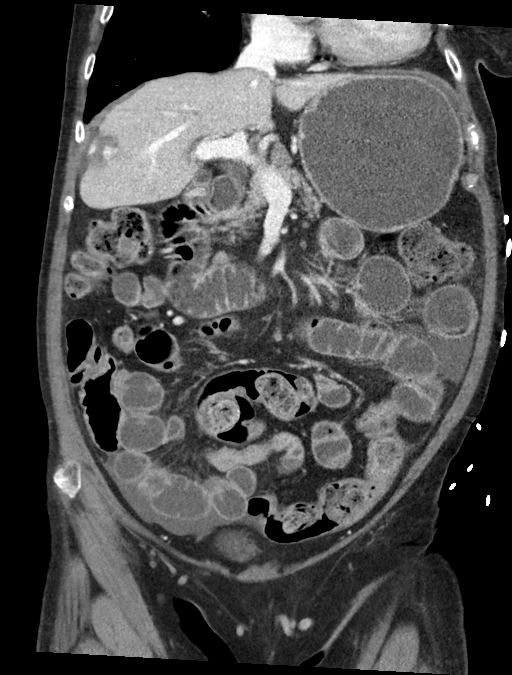
[im 62/111  soft-tissue]
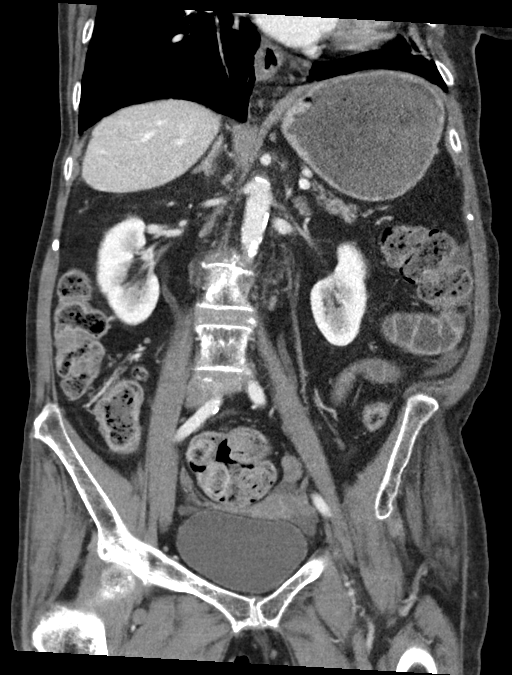

[15 of 46 positions shown; findings below may reference images not displayed]

FINDINGS: Lower chest: No acute abnormality.

Hepatobiliary: No solid liver abnormality is seen. Definitively
benign subcapsular hemangioma of the right lobe of the liver,
unchanged (series 2, image 21). No gallstones, gallbladder wall
thickening, or biliary dilatation.

Pancreas: Unremarkable. No pancreatic ductal dilatation or
surrounding inflammatory changes.

Spleen: Normal in size without significant abnormality.

Adrenals/Urinary Tract: Adrenal glands are unremarkable. Kidneys are
normal, without renal calculi, solid lesion, or hydronephrosis.
Bladder is unremarkable.

Stomach/Bowel: The stomach is fluid filled and the proximal small
bowel is mildly distended, largest loops measuring up to 3.8 cm.
There is an abrupt caliber change of the mid to distal small bowel
in the midline ventral abdomen (series 2, image 53, series 5, image
43), with relatively decompressed, although still gas and fluid
containing distal small bowel. Appendix appears normal. Large burden
of stool and stool balls throughout the colon with numerous large
stool balls in the rectum.

Vascular/Lymphatic: Aortic atherosclerosis. No enlarged abdominal or
pelvic lymph nodes.

Reproductive: No mass or other significant abnormality.

Other: No abdominal wall hernia or abnormality. Small volume ascites
throughout the abdomen.

Musculoskeletal: No acute or significant osseous findings.
IMPRESSION: 1. The stomach is fluid filled and the proximal small bowel is
mildly distended. There is an abrupt caliber change of the mid to
distal small bowel in the midline ventral abdomen, with relatively
decompressed, although still gas and fluid containing distal small
bowel. Findings are concerning for partial or developing small bowel
obstruction.
2. Large burden of stool and stool balls throughout the colon with
numerous large stool balls in the rectum. Correlate for fecal
impaction.
3. Small volume ascites.

## 2021-09-19 MED ORDER — NITROFURANTOIN MONOHYD MACRO 100 MG PO CAPS: 100.0000 mg | ORAL_CAPSULE | Freq: Two times a day (BID) | ORAL | 0 refills | Status: DC

## 2021-09-19 MED ORDER — HYDROMORPHONE HCL 1 MG/ML IJ SOLN
0.5000 mg | Freq: Once | INTRAMUSCULAR | Status: AC
  Administered 2021-09-19: 0.5 mg via INTRAVENOUS
  Filled 2021-09-19: qty 0.5

## 2021-09-19 MED ORDER — METOPROLOL SUCCINATE ER 25 MG PO TB24: 125.0000 mg | ORAL_TABLET | Freq: Every day | ORAL | 1 refills | Status: DC

## 2021-09-19 MED ORDER — METOPROLOL SUCCINATE ER 50 MG PO TB24
125.0000 mg | ORAL_TABLET | Freq: Every day | ORAL | Status: DC
  Administered 2021-09-19: 125 mg via ORAL
  Filled 2021-09-19: qty 1

## 2021-09-19 NOTE — Discharge Summary (Signed)
Physician Discharge Summary   Patient: [name] MRN: [id number] DOB: 06-Oct-1945  Admit date:     09/13/2021  Discharge date: 09/19/21  Discharge Physician: Shanon Brow Brodey Bonn   PCP: Carrolyn Meiers, MD   Recommendations at discharge:   Please follow up with primary care provider within 1-2 weeks  Please repeat BMP and CBC in one week     Hospital Course: 76 year old female with a history of schizophrenia, hypertension, diabetes mellitus type 2, DVT, anxiety, and atrial fibrillation presenting with altered mental status and hypoxia.  The patient normally resides at [facility]. Unfortunately patient is not able to provide any history at all as she is on the vent and sedated.  She was brought in by facility staff that reported she was less responsive and having difficulty breathing.  Also noted her to have a fever.  In the ED patient was not protecting her airway and was quite hypoxic despite nasal cannula.  She was intubated.  Work-up revealed a UTI and interstitial edema versus atypical infection on chest x-ray.  CT head did not show any abnormality.  Patient had 2 L bolus, and continued to be hypotensive so she was started on Levophed.  Admission requested for septic shock. PCCM consulted to assist with management. Echo showed EF 65-70% with HOCM.  Cardiology was consulted to assist with management.  Assessment and Plan: * Septic shock (HCC) - Fever 100.6, tachycardia 116, respiratory rate 25, blood pressure 70/43, acute respiratory failure with an oxygen sat of 78%, leukocytosis 17, lactic acidosis 2.3 - Secondary to UTI  - Continue IV antibiotics - Patient is requiring 7 mcg of Levophed>>weaned off 8/24 afternoon - This is after receiving her 30 mL/kg bolus - initially started cefepime and doxycycline>>ceftriaxone Sepsis physiology now resolved  Schizophrenia ([place]) Continue  Risperdal, Remeron, Cymbalta--restarted Continue clonazepam--restarted  Uncontrolled type 2  diabetes mellitus with hyperglycemia, with long-term current use of insulin (HCC) Continue Levemir 10 units hx Continue NovoLog sliding scale 03/22/2021 hemoglobin A1c 7.1 08/19/21 A1C--6.2 CBGs largely controlled during the hospitalization  GERD (gastroesophageal reflux disease) - Continue Protonix  Acute respiratory failure with hypoxia (HCC) - Secondary to pulmonary edema - Chest x-ray shows interstitial pulmonary edema versus atypical infection- patient was discharged July 23 after admission from pneumonia, those infiltrates should be resolved by now - trach aspirate culture S.pneumoniae -appreciate PCCM--discussed with Dr. Elsworth Soho --extubated 09/15/21 afternoon --MRSA screen is positive --now remains stable on RA  Acute diastolic CHF (congestive heart failure) ([place]) Continue IV lasix>>stop as pt more euvolemic now with HOCM --remains euvolemic 10/15/19 Echo EF 60-65%, no WMA, mild MR Repeat Echo EF 65-70%, +HOCM, no WMA Accurate I/O--6.1L out last 72 hours Cardiology consult appreciated  UTI (urinary tract infection) -UA >50 WBC - Urine culture MDR Ecoli - Blood culture neg to date - continue nitrofurantoin>>4 more days after dc  Acute tracheobronchitis Trach aspirate = S. pneuomoniae Continue ceftriaxone Finished 7 days abx during hospitalizatoin  HOCM (hypertrophic obstructive cardiomyopathy) ([place]) Avoid hypotension Use BB for afib neosynephrine if hypotensive outpt cardiology follow up Seen by cardiology during this hospitalization  DVT, lower extremity, recurrent (HCC) On [place]ia lovenox currently>>plan to transition back to apixaban   Stricture and stenosis of esophagus 07/23/2021 EGD--showed food in the middle third and lower third of the esophagus.  The patient was subsequently debated during the procedure secondary to aspiration risk.  90% of the retained food was removed with 10% pushed into the stomach.  A distal esophageal stricture was noted. -  Continue full liquid  diet once extubated -Continues to have dysphagia -07/25/21 barium swallow>>Nonspecific esophageal dysmotility disorder, with termination of primary peristaltic waves in the mid esophagus, and tertiary contractions in the esophagus Continue IV pantoprazole twice daily Tolerating dys 1 diet  Opioid dependence ([place]) PDMP reviewed Norco 10/325, #90, last refilled on 09/01/21, 08/03/21 Clonazepam 0.5 mg, #90, last filled on 09/08/21, 08/26/21  Essential hypertension Holding dilitazem and metoprolol succinate due to hypotension initially --restart metoprolol succinate now that BPs improving  Goals of care, counseling/discussion Discussed with patient's Legal Guardian, [name] --agrees with DNR  Pressure injury of skin Stage 1 on buttock and left hip Present on admission Local care  IV infiltration IV with levophed infiltrated in right arm --given phentolamine --no signs of necrosis presently  Acute metabolic encephalopathy - obtunded in ED initially - CT head shows no acute intracranial abnormality - Patient was hypotensive which is likely contributing at 70/43, but also hypoxic as she was not protecting her airway at 78% - intubated in ED to protect airway - Initial pH 7.3, PCO2 33 -  altered mental status secondary to sepsis, hypoxia and hypotension - overall improved, back to baseline   Paroxysmal atrial fibrillation (HCC) Continue lovenox for [facility] with plans to switch back to apixaban Continue amiodarone Holding metoprolol succinate and diltiazem CD due to hypotension initially Having some RVR as expected Increase metoprolol succinate to 125 mg daily         Consultants: pulm, cardiology Procedures performed: none  Disposition: Assisted living Diet recommendation:  Carb modified diet DISCHARGE MEDICATION: Allergies as of 09/19/2021       Reactions   Sulfa Antibiotics Rash        Medication List     STOP taking these medications    diltiazem 120 MG 24  hr capsule Commonly known as: CARDIZEM CD       TAKE these medications    Abilify 2 MG tablet Generic drug: ARIPiprazole Take 1 tablet (2 mg total) by mouth daily. What changed: when to take this   acetaminophen 325 MG tablet Commonly known as: TYLENOL Take 650 mg by mouth 3 (three) times daily as needed for mild pain or moderate pain.   albuterol 108 (90 Base) MCG/ACT inhaler Commonly known as: VENTOLIN HFA Inhale 2 puffs into the lungs every 6 (six) hours as needed for wheezing or shortness of breath.   amiodarone 200 MG tablet Commonly known as: PACERONE Take 1 tablet (200 mg total) by mouth daily.   apixaban 5 MG Tabs tablet Commonly known as: ELIQUIS Take 1 tablet (5 mg total) by mouth 2 (two) times daily.   atorvastatin 40 MG tablet Commonly known as: LIPITOR Take 1 tablet by mouth at bedtime.   BAZA PROTECT EX Apply 1 Application topically in the morning and at bedtime.   busPIRone 7.5 MG tablet Commonly known as: BUSPAR Take 7.5 mg by mouth 2 (two) times daily.   calcium carbonate 500 MG chewable tablet Commonly known as: TUMS - dosed in mg elemental calcium Chew 1 tablet by mouth 4 (four) times daily as needed for heartburn.   cholecalciferol 25 MCG (1000 UNIT) tablet Commonly known as: VITAMIN D3 Take 2,000 Units by mouth daily.   clonazePAM 0.5 MG tablet Commonly known as: KLONOPIN Take 1 tablet (0.5 mg total) by mouth 3 (three) times daily as needed for anxiety. What changed: when to take this   DULoxetine 60 MG capsule Commonly known as: CYMBALTA Take 1 capsule by mouth daily.  EasyMax Test test strip Generic drug: glucose blood USE TO CHECK BLOOD SUGAR FOUR TIMES DAILY.(HOLD IF BS<70: CALL MD IF BS> 400)   GoodSense Artificial Tears 5-6 MG/ML Soln Generic drug: Polyvinyl Alcohol-Povidone Apply 1 drop to eye 3 (three) times daily.   HYDROcodone-acetaminophen 10-325 MG tablet Commonly known as: NORCO Take 1 tablet by mouth every 8  (eight) hours as needed for severe pain. What changed: when to take this   Lancets Thin Misc USE TO CHECK BLOOD SUGAR FOUR TIMES DAILY.(HOLD IF BS<70: CALL MD IF BS> 400)   latanoprost 0.005 % ophthalmic solution Commonly known as: XALATAN Place 1 drop into both eyes at bedtime.   Levemir FlexTouch 100 UNIT/ML FlexPen Generic drug: insulin detemir INJECT 22 UNITS SUBCUTANEOUSLY AT BEDTIME.(HOLD IF BS<70: CALL MD IF BS> 400) What changed: See the new instructions.   loratadine 10 MG tablet Commonly known as: CLARITIN Take 10 mg by mouth daily.   lubiprostone 8 MCG capsule Commonly known as: AMITIZA Take 1 capsule by mouth daily.   metoCLOPramide 5 MG tablet Commonly known as: REGLAN Take 5 mg by mouth in the morning, at noon, and at bedtime. If zofran is not working   metoprolol succinate 25 MG 24 hr tablet Commonly known as: TOPROL-XL Take 5 tablets (125 mg total) by mouth daily. What changed:  medication strength how much to take additional instructions   midodrine 10 MG tablet Commonly known as: PROAMATINE Take 10 mg by mouth 3 (three) times daily.   mirtazapine 30 MG tablet Commonly known as: REMERON Take 30 mg by mouth at bedtime.   naloxegol oxalate 12.5 MG Tabs tablet Commonly known as: MOVANTIK Take 1 tablet (12.5 mg total) by mouth daily.   nitrofurantoin (macrocrystal-monohydrate) 100 MG capsule Commonly known as: MACROBID Take 1 capsule (100 mg total) by mouth every 12 (twelve) hours.   NovoLOG FlexPen 100 UNIT/ML FlexPen Generic drug: insulin aspart INJECT SUBCUTANEOUSLY AS FOLLOWS WITH MEALS: 90-150=10u: 151-200=11u: 201-250=12u: 251-300=13u: 301-350=14u: 351-400=15u: BS>400=16u & CALL MD. What changed: See the new instructions.   ondansetron 4 MG tablet Commonly known as: ZOFRAN Take 4 mg by mouth 2 (two) times daily.   oxybutynin 5 MG 24 hr tablet Commonly known as: DITROPAN-XL Take 5 mg by mouth daily.   pantoprazole 40 MG  tablet Commonly known as: PROTONIX Take 1 tablet (40 mg total) by mouth 2 (two) times daily before a meal.   polyethylene glycol 17 g packet Commonly known as: MIRALAX / GLYCOLAX Take 17 g by mouth daily.   risperiDONE 1 MG tablet Commonly known as: RISPERDAL Take 1-2 mg by mouth 2 (two) times daily. Take 1 tablet by mouth in the morning. Take 2 tablets by mouth at bedtime.   Santyl 250 UNIT/GM ointment Generic drug: collagenase Apply 1 Application topically daily.   senna 8.6 MG tablet Commonly known as: SENOKOT Take 1 tablet by mouth daily.   Tradjenta 5 MG Tabs tablet Generic drug: linagliptin TAKE (1) TABLET BY MOUTH ONCE DAILY. What changed: See the new instructions.        Discharge Exam: Filed Weights   09/16/21 0334 09/17/21 0500 09/18/21 0500  Weight: 57.4 kg 58.3 kg 57.4 kg   HEENT:  /AT, No thrush, no icterus CV:  RRR, no rub, no S3, no S4 Lung:  bibasilar crackles. No wheeze Abd:  soft/+BS, NT Ext:  No edema, no lymphangitis, no synovitis, no rash   Condition at discharge: stable  The results of significant diagnostics from this hospitalization (including  imaging, microbiology, ancillary and laboratory) are listed below for reference.   Imaging Studies: [place] EKG SITE RITE  Result Date: 09/15/2021 If Site Rite image not attached, placement could not be confirmed due to current cardiac rhythm.  ECHOCARDIOGRAM COMPLETE  Result Date: 09/14/2021    ECHOCARDIOGRAM REPORT   Patient Name:   JALYIAH SHELLEY Date of Exam: 09/14/2021 Medical Rec #:  [id number]       Height:       67.0 in Accession #:    [id number]      Weight:       146.2 lb Date of Birth:  19-Aug-1945       BSA:          1.770 m Patient Age:    8 years        BP:           129/67 mmHg Patient Gender: F               HR:           84 bpm. Exam Location:  [facility] Procedure: 2D Echo, Cardiac Doppler and Color Doppler Indications:    CHF  History:        Patient has prior history of Echocardiogram  examinations, most                 recent 10/05/2019. CHF, Stroke, Arrythmias:Atrial Fibrillation,                 Signs/Symptoms:Altered Mental Status; Risk Factors:Hypertension,                 Diabetes and Former Smoker.  Sonographer:    Wenda Low Referring Phys: [phone] Isadore Bokhari  Sonographer Comments: Echo performed with patient supine and on artificial respirator. IMPRESSIONS  1. Severe asymmetric hypertrophy of the basal septum up to 55m. There is evidence of SAM of the MV leaflets with obstruction up to 37 mmHG (variation noted due to afib). 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 Would consider a cardiac MRI for better characterization. This was not well characterized on the last echocardiogram. Left ventricular ejection fraction, by estimation, is 65 to 70%. The left ventricle has normal function. The left ventricle has no regional wall motion abnormalities. There is severe asymmetric left ventricular hypertrophy of the basal-septal segment. Left ventricular diastolic function could not be evaluated.  2. Right ventricular systolic function is normal. The right ventricular size is normal.  3. Left atrial size was mildly dilated.  4. Right atrial size was mildly dilated.  5. The mitral valve is degenerative. Trivial mitral valve regurgitation. No evidence of mitral stenosis.  6. The aortic valve is tricuspid. There is mild calcification of the aortic valve. There is mild thickening of the aortic valve. Aortic valve regurgitation is not visualized. Aortic valve sclerosis/calcification is present, without any evidence of aortic stenosis. FINDINGS  Left Ventricle: Severe asymmetric hypertrophy of the basal septum up to 143m There is evidence of SAM of the MV leaflets with obstruction up to 37 mmHG (variation noted due to afib). 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  Would consider a cardiac MRI for better characterization. This was not well  characterized on the last echocardiogram. Left ventricular ejection fraction, by estimation, is 65 to 70%. The left ventricle has normal function. The left ventricle has no regional wall motion abnormalities. The left ventricular internal cavity size was normal in size. There is severe asymmetric left ventricular hypertrophy of the basal-septal segment. Left  ventricular diastolic function could not be evaluated due to atrial fibrillation. Left ventricular diastolic function could not be evaluated. Right Ventricle: The right ventricular size is normal. No increase in right ventricular wall thickness. Right ventricular systolic function is normal. Left Atrium: Left atrial size was mildly dilated. Right Atrium: Right atrial size was mildly dilated. Pericardium: Trivial pericardial effusion is present. Presence of epicardial fat layer. Mitral Valve: The mitral valve is degenerative in appearance. Trivial mitral valve regurgitation. No evidence of mitral valve stenosis. MV peak gradient, 7.3 mmHg. The mean mitral valve gradient is 2.0 mmHg. Tricuspid Valve: The tricuspid valve is grossly normal. Tricuspid valve regurgitation is mild . No evidence of tricuspid stenosis. Aortic Valve: The aortic valve is tricuspid. There is mild calcification of the aortic valve. There is mild thickening of the aortic valve. Aortic valve regurgitation is not visualized. Aortic valve sclerosis/calcification is present, without any evidence of aortic stenosis. Aortic valve mean gradient measures 7.5 mmHg. Aortic valve peak gradient measures 16.2 mmHg. Aortic valve area, by VTI measures 2.37 cm. Pulmonic Valve: The pulmonic valve was grossly normal. Pulmonic valve regurgitation is not visualized. No evidence of pulmonic stenosis. Aorta: The aortic root and ascending aorta are structurally normal, with no evidence of dilitation. Venous: IVC assessment for right atrial pressure unable to be performed due to mechanical ventilation. IAS/Shunts:  The atrial septum is grossly normal.  LEFT VENTRICLE PLAX 2D LVIDd:         3.80 cm LVIDs:         1.80 cm LV PW:         1.30 cm LV IVS:        1.70 cm LVOT diam:     2.00 cm LV SV:         84 LV SV Index:   48 LVOT Area:     3.14 cm  RIGHT VENTRICLE RV Basal diam:  3.70 cm RV Mid diam:    3.10 cm RV S prime:     12.80 cm/s LEFT ATRIUM             Index        RIGHT ATRIUM           Index LA diam:        4.50 cm 2.54 cm/m   RA Area:     18.10 cm LA Vol (A2C):   62.5 ml 35.31 ml/m  RA Volume:   53.70 ml  30.34 ml/m LA Vol (A4C):   62.0 ml 35.03 ml/m LA Biplane Vol: 62.6 ml 35.37 ml/m  AORTIC VALVE                     PULMONIC VALVE AV Area (Vmax):    2.31 cm      PV Vmax:       0.87 m/s AV Area (Vmean):   2.40 cm      PV Peak grad:  3.0 mmHg AV Area (VTI):     2.37 cm AV Vmax:           201.50 cm/s AV Vmean:          123.200 cm/s AV VTI:            0.355 m AV Peak Grad:      16.2 mmHg AV Mean Grad:      7.5 mmHg LVOT Vmax:         148.00 cm/s LVOT Vmean:        94.200 cm/s LVOT  VTI:          0.268 m LVOT/AV VTI ratio: 0.75  AORTA Ao Root diam: 3.70 cm Ao Asc diam:  3.40 cm MITRAL VALVE                TRICUSPID VALVE MV Area (PHT): 3.95 cm     TR Peak grad:   35.0 mmHg MV Area VTI:   3.43 cm     TR Vmax:        296.00 cm/s MV Peak grad:  7.3 mmHg MV Mean grad:  2.0 mmHg     SHUNTS MV Vmax:       1.35 m/s     Systemic VTI:  0.27 m MV Vmean:      64.5 cm/s    Systemic Diam: 2.00 cm MV Decel Time: 192 msec MV E velocity: 118.00 cm/s Courtney Chiquito MD Electronically signed by Courtney Chiquito MD Signature Date/Time: 09/14/2021/3:29:21 PM    Final    CT Head Wo Contrast  Result Date: 09/13/2021 CLINICAL DATA:  Headache, new or worsening. EXAM: CT HEAD WITHOUT CONTRAST TECHNIQUE: Contiguous axial images were obtained from the base of the skull through the vertex without intravenous contrast. RADIATION DOSE REDUCTION: This exam was performed according to the departmental dose-optimization program which includes  automated exposure control, adjustment of the mA and/or kV according to patient size and/or use of iterative reconstruction technique. COMPARISON:  CT examination dated August 04, 2021 FINDINGS: Brain: No evidence of acute infarction, hemorrhage, hydrocephalus, extra-axial collection or mass lesion/mass effect. Low-attenuation of the periventricular and subcortical white matter presumed advanced chronic microvascular ischemic changes. Vascular: No hyperdense vessel or unexpected calcification. Skull: Normal. Negative for fracture or focal lesion. Sinuses/Orbits: Paranasal sinus postsurgical changes. Other: None. IMPRESSION: 1. No acute intracranial abnormality. 2. Advanced chronic microvascular ischemic changes of the supratentorial white matter. Electronically Signed   By: Keane Police D.O.   On: 09/13/2021 23:38   DG Chest Port 1 View  Result Date: 09/13/2021 CLINICAL DATA:  Hypoxia EXAM: PORTABLE CHEST 1 VIEW COMPARISON:  08/29/2021 FINDINGS: Cardiomegaly. Interstitial prominence throughout the lungs, right greater than left. This could reflect interstitial edema or atypical infection. NG tube is in the stomach. Endotracheal tube is 3 cm above the carina. No effusions or acute bony abnormality. IMPRESSION: Cardiomegaly. Increasing interstitial prominence, right greater than left, favor interstitial edema although atypical infection cannot be excluded. Electronically Signed   By: Rolm Baptise M.D.   On: 09/13/2021 22:11   DG Chest 2 View  Result Date: 08/29/2021 CLINICAL DATA:  Pneumonia, organism unspecified(486) EXAM: CHEST - 2 VIEW COMPARISON:  Chest x-ray 08/14/2021 FINDINGS: Enlarged cardiac silhouette. The heart and mediastinal contours are unchanged. Aortic calcification. Interval resolution of left base airspace opacity. No focal consolidation. Chronic coarsened markings with no overt pulmonary edema. No pleural effusion. No pneumothorax. No acute osseous abnormality. Diffuse decreased bone density.  Chronic multilevel lower thoracic vertebral body height loss . IMPRESSION: 1. Interval resolution of left base airspace opacity. No active cardiopulmonary disease. 2.  Aortic Atherosclerosis (ICD10-I70.0). Electronically Signed   By: Iven Finn M.D.   On: 08/29/2021 16:32    Microbiology: Results for orders placed or performed during the hospital encounter of 09/13/21  Resp Panel by RT-PCR (Flu A&B, Covid) Anterior Nasal Swab     Status: None   Collection Time: 09/13/21  9:20 PM   Specimen: Anterior Nasal Swab  Result Value Ref Range Status   SARS Coronavirus 2 by RT PCR NEGATIVE NEGATIVE  Final    Comment: (NOTE) SARS-CoV-2 target nucleic acids are NOT DETECTED.  The SARS-CoV-2 RNA is generally detectable in upper respiratory specimens during the acute phase of infection. The lowest concentration of SARS-CoV-2 viral copies this assay can detect is 138 copies/mL. A negative result does not preclude SARS-Cov-2 infection and should not be used as the sole basis for treatment or other patient management decisions. A negative result may occur with  improper specimen collection/handling, submission of specimen other than nasopharyngeal swab, presence of viral mutation(s) within the areas targeted by this assay, and inadequate number of viral copies(<138 copies/mL). A negative result must be combined with clinical observations, patient history, and epidemiological information. The expected result is Negative.  Fact Sheet for Patients:  EntrepreneurPulse.com.au  Fact Sheet for Healthcare Providers:  IncredibleEmployment.be  This test is no t yet approved or cleared by the [place] FDA and  has been authorized for detection and/or diagnosis of SARS-CoV-2 by FDA under an Emergency Use Authorization (EUA). This EUA will remain  in effect (meaning this test can be used) for the duration of the COVID-19 declaration under Section 564(b)(1) of the Act,  21 U.S.C.section 360bbb-3(b)(1), unless the authorization is terminated  or revoked sooner.       Influenza A by PCR NEGATIVE NEGATIVE Final   Influenza B by PCR NEGATIVE NEGATIVE Final    Comment: (NOTE) The Xpert Xpress SARS-CoV-2/FLU/RSV plus assay is intended as an aid in the diagnosis of influenza from Nasopharyngeal swab specimens and should not be used as a sole basis for treatment. Nasal washings and aspirates are unacceptable for Xpert Xpress SARS-CoV-2/FLU/RSV testing.  Fact Sheet for Patients: EntrepreneurPulse.com.au  Fact Sheet for Healthcare Providers: IncredibleEmployment.be  This test is not yet approved or cleared by the [place] FDA and has been authorized for detection and/or diagnosis of SARS-CoV-2 by FDA under an Emergency Use Authorization (EUA). This EUA will remain in effect (meaning this test can be used) for the duration of the COVID-19 declaration under Section 564(b)(1) of the Act, 21 U.S.C. section 360bbb-3(b)(1), unless the authorization is terminated or revoked.  Performed at [facility], [address] [place], [place] [address]   Culture, blood (routine x 2)     Status: None   Collection Time: 09/13/21  9:20 PM   Specimen: BLOOD RIGHT HAND  Result Value Ref Range Status   Specimen Description BLOOD RIGHT HAND  Final   Special Requests   Final    BOTTLES DRAWN AEROBIC AND ANAEROBIC Blood Culture results may not be optimal due to an inadequate volume of blood received in culture bottles   Culture   Final    NO GROWTH 5 DAYS Performed at [facility], [address], [place], [place] [address]    Report Status 09/18/2021 FINAL  Final  Urine Culture     Status: Abnormal   Collection Time: 09/13/21 10:12 PM   Specimen: Urine, Clean Catch  Result Value Ref Range Status   Specimen Description   Final    URINE, CLEAN CATCH Performed at [facility], [address], [place], [place] [address]    Special  Requests   Final    NONE Performed at [facility], [address] [place], [place] [address]    Culture (A)  Final    >=100,000 COLONIES/mL ESCHERICHIA COLI Susceptibility Pattern Suggests Possibility of an Extended Spectrum Beta Lactamase Producer. Contact Laboratory Within 7 Days if Confirmation Warranted. MULTI-DRUG RESISTANT ORGANISM CRITICAL RESULT CALLED TO, READ BACK BY AND  VERIFIED WITH: Merrilee Seashore COFFEE [id number] AT 1138 BY CM Performed at [facility], [place] [address] [place], [place] [address]    Report Status 09/17/2021 FINAL  Final   Organism ID, Bacteria ESCHERICHIA COLI (A)  Final      Susceptibility   Escherichia coli - MIC*    AMPICILLIN >=32 RESISTANT Resistant     CEFAZOLIN >=64 RESISTANT Resistant     CEFEPIME >=32 RESISTANT Resistant     CEFTRIAXONE >=64 RESISTANT Resistant     CIPROFLOXACIN >=4 RESISTANT Resistant     GENTAMICIN >=16 RESISTANT Resistant     IMIPENEM RESISTANT Resistant     NITROFURANTOIN <=16 SENSITIVE Sensitive     TRIMETH/SULFA >=320 RESISTANT Resistant     AMPICILLIN/SULBACTAM >=32 RESISTANT Resistant     PIP/TAZO 64 INTERMEDIATE Intermediate     * >=100,000 COLONIES/mL ESCHERICHIA COLI  Culture, blood (routine x 2)     Status: None   Collection Time: 09/13/21 11:01 PM   Specimen: BLOOD  Result Value Ref Range Status   Specimen Description BLOOD  Final   Special Requests NONE  Final   Culture   Final    NO GROWTH 5 DAYS Performed at [facility], [address], [place], [place] [address]    Report Status 09/18/2021 FINAL  Final  MRSA Next Gen by PCR, Nasal     Status: Abnormal   Collection Time: 09/14/21  1:50 AM   Specimen: Nasal Mucosa; Nasal Swab  Result Value Ref Range Status   MRSA by PCR Next Gen DETECTED (A) NOT DETECTED Final    Comment: RESULT CALLED TO, READ BACK BY AND VERIFIED WITH: SUPPER,B'@0507'$  BY MATTHEWS, B 8.23.23 (NOTE) The GeneXpert MRSA Assay (FDA approved for NASAL specimens only), is one component of a  comprehensive MRSA colonization surveillance program. It is not intended to diagnose MRSA infection nor to guide or monitor treatment for MRSA infections. Test performance is not FDA approved in patients less than 56 years old. Performed at [facility], [address], [place], [place] [address]   Expectorated Sputum Assessment w Gram Stain, Rflx to Resp Cult     Status: None (Preliminary result)   Collection Time: 09/14/21  2:48 AM   Specimen: Expectorated Sputum  Result Value Ref Range Status   Specimen Description   Final    EXPECTORATED SPUTUM Performed at [facility], [address] [place], [place] [address]    Special Requests   Final    NONE Performed at [facility], [address], [place], [place] [address]    Sputum evaluation   Final    THIS SPECIMEN IS ACCEPTABLE FOR SPUTUM CULTURE Performed at [facility], [place] [address], [place], [place] [address]    Report Status PENDING  Incomplete  Culture, Respiratory w Gram Stain     Status: None   Collection Time: 09/14/21  2:48 AM  Result Value Ref Range Status   Specimen Description EXPECTORATED SPUTUM  Final   Special Requests NONE Reflexed from [id number]  Final   Gram Stain   Final    MODERATE WBC PRESENT,BOTH PMN AND MONONUCLEAR FEW GRAM POSITIVE COCCI IN PAIRS RARE GRAM POSITIVE RODS Performed at [facility], [place] [address] [place], [place] [address]    Culture ABUNDANT STREPTOCOCCUS PNEUMONIAE  Final   Report Status 09/16/2021 FINAL  Final   Organism ID, Bacteria STREPTOCOCCUS PNEUMONIAE  Final      Susceptibility   Streptococcus pneumoniae - MIC*  ERYTHROMYCIN 2 RESISTANT Resistant     LEVOFLOXACIN 0.5 SENSITIVE Sensitive     VANCOMYCIN 0.5 SENSITIVE Sensitive     PENICILLIN (meningitis) 0.25 RESISTANT Resistant     PENO - penicillin 0.25      PENICILLIN (non-meningitis) 0.25 SENSITIVE Sensitive     PENICILLIN (oral) 0.25 INTERMEDIATE Intermediate     CEFTRIAXONE (non-meningitis) 0.25 SENSITIVE  Sensitive     CEFTRIAXONE (meningitis) 0.25 SENSITIVE Sensitive     * ABUNDANT STREPTOCOCCUS PNEUMONIAE  Carbapenem Resistance Panel     Status: None   Collection Time: 09/14/21 10:16 AM  Result Value Ref Range Status   Carba Resistance IMP Gene NOT DETECTED NOT DETECTED Final   Carba Resistance VIM Gene NOT DETECTED NOT DETECTED Final   Carba Resistance NDM Gene NOT DETECTED NOT DETECTED Final   Carba Resistance KPC Gene NOT DETECTED NOT DETECTED Final   Carba Resistance OXA48 Gene NOT DETECTED NOT DETECTED Final    Comment: (NOTE) Cepheid Carba-R is an FDA-cleared nucleic acid amplification test  (NAAT)for the detection and differentiation of genes encoding the  most prevalent carbapenemases in bacterial isolate samples. Carbapenemase gene identification and implementation of comprehensive  infection control measures are recommended by the CDC to prevent the  spread of the resistant organisms. Performed at [facility], [place] [address] [place], [place] [address]     Labs: CBC: Recent Labs  Lab 09/13/21 2121 09/14/21 0310 09/15/21 0253 09/16/21 0553 09/17/21 0725 09/19/21 0339  WBC 17.1* 15.7* 16.6* 13.5* 5.2 5.5  NEUTROABS 15.0* 13.6*  --   --   --   --   HGB 10.0* 9.4* 10.3* 11.2* 10.6* 12.0  HCT 31.3* 29.5* 31.5* 34.2* 32.7* 37.0  MCV 86.9 87.0 84.5 84.2 84.3 84.7  PLT 273 216 231 251 264 892   Basic Metabolic Panel: Recent Labs  Lab 09/14/21 0310 09/15/21 0253 09/16/21 0553 09/17/21 0404 09/19/21 0339  NA 135 133* 135 138 136  K 4.0 2.9* 3.8 4.0 4.2  CL 103 97* 99 102 102  CO2 '25 27 29 27 26  '$ GLUCOSE 196* 153* 157* 148* 163*  BUN '15 11 13 19 15  '$ CREATININE 0.79 0.67 0.63 0.64 0.59  CALCIUM 7.7* 7.9* 8.4* 8.7* 8.9  MG 1.8 1.9 2.0 2.2 2.1  PHOS 3.8  --   --   --   --    Liver Function Tests: Recent Labs  Lab 09/13/21 2121 09/14/21 0310  AST 23 19  ALT 19 16  ALKPHOS 84 64  BILITOT 0.6 1.0  PROT 7.5 6.0*  ALBUMIN 3.5 2.8*   CBG: Recent  Labs  Lab 09/17/21 2223 09/18/21 0704 09/18/21 1122 09/18/21 1627 09/18/21 2055  GLUCAP 124* 176* 180* 132* 142*    Discharge time spent: greater than 30 minutes.  Signed: Orson Eva, MD [facility] 09/19/2021

## 2021-09-19 NOTE — Progress Notes (Signed)
Rounding Note    Patient Name: [name] Date of Encounter: 09/19/2021  [place] Cardiologist: Rozann Lesches, MD   Subjective   Appears comfortable laying on left side.  Unable to obtain history  Inpatient Medications    Scheduled Meds:  amiodarone  200 mg Oral Daily   apixaban  5 mg Oral BID   ARIPiprazole  2 mg Oral Daily   busPIRone  7.5 mg Oral BID   Chlorhexidine Gluconate Cloth  6 each Topical Q0600   clonazePAM  0.5 mg Oral TID   DULoxetine  60 mg Oral Daily   insulin aspart  0-5 Units Subcutaneous QHS   insulin aspart  0-9 Units Subcutaneous TID WC   insulin detemir  10 Units Subcutaneous QHS   metoprolol succinate  125 mg Oral Daily   midodrine  10 mg Oral TID WC   mupirocin ointment   Nasal BID   nitrofurantoin (macrocrystal-monohydrate)  100 mg Oral Q12H   pantoprazole (PROTONIX) IV  40 mg Intravenous Q12H   potassium chloride  40 mEq Oral BID   Continuous Infusions:  cefTRIAXone (ROCEPHIN)  IV Stopped (09/18/21 1812)   PRN Meds: acetaminophen **OR** acetaminophen, HYDROcodone-acetaminophen, mouth rinse, prochlorperazine   Vital Signs    Vitals:   09/19/21 0400 09/19/21 0500 09/19/21 0600 09/19/21 0742  BP: (!) 150/92 (!) 153/108 (!) 150/86   Pulse:      Resp: '17 20 16   '$ Temp: 97.6 F (36.4 C)   97.6 F (36.4 C)  TempSrc: Oral   Oral  SpO2:      Weight:      Height:        Intake/Output Summary (Last 24 hours) at 09/19/2021 0922 Last data filed at 09/19/2021 2585 Gross per 24 hour  Intake 100 ml  Output 600 ml  Net -500 ml      09/18/2021    5:00 AM 09/17/2021    5:00 AM 09/16/2021    3:34 AM  Last 3 Weights  Weight (lbs) 126 lb 8.7 oz 128 lb 8.5 oz 126 lb 8.7 oz  Weight (kg) 57.4 kg 58.3 kg 57.4 kg      Telemetry    Atrial fibrillation with heart rates in the 90s to 100s fairly consistently- Personally Reviewed  ECG    No new- Personally Reviewed  Physical Exam   GEN: No acute distress.   Neck: No  JVD Cardiac: Irregularly irregular no significant tachycardia, soft systolic murmur, no rubs, or gallops.  Respiratory: Clear to auscultation bilaterally. GI: Soft, nontender, non-distended  MS: No edema; No deformity. Neuro:  Nonfocal  Psych: Underlying schizophrenia  Labs    High Sensitivity Troponin:   Recent Labs  Lab 09/13/21 2158 09/13/21 2301  TROPONINIHS 5 6     Chemistry Recent Labs  Lab 09/13/21 2121 09/14/21 0310 09/15/21 0253 09/16/21 0553 09/17/21 0404 09/19/21 0339  NA 134* 135   < > 135 138 136  K 4.1 4.0   < > 3.8 4.0 4.2  CL 98 103   < > 99 102 102  CO2 25 25   < > '29 27 26  '$ GLUCOSE 158* 196*   < > 157* 148* 163*  BUN 17 15   < > '13 19 15  '$ CREATININE 0.99 0.79   < > 0.63 0.64 0.59  CALCIUM 8.3* 7.7*   < > 8.4* 8.7* 8.9  MG  --  1.8   < > 2.0 2.2 2.1  PROT 7.5 6.0*  --   --   --   --  ALBUMIN 3.5 2.8*  --   --   --   --   AST 23 19  --   --   --   --   ALT 19 16  --   --   --   --   ALKPHOS 84 64  --   --   --   --   BILITOT 0.6 1.0  --   --   --   --   GFRNONAA 59* >60   < > >60 >60 >60  ANIONGAP 11 7   < > '7 9 8   '$ < > = values in this interval not displayed.    Lipids No results for input(s): "CHOL", "TRIG", "HDL", "LABVLDL", "LDLCALC", "CHOLHDL" in the last 168 hours.  Hematology Recent Labs  Lab 09/16/21 0553 09/17/21 0725 09/19/21 0339  WBC 13.5* 5.2 5.5  RBC 4.06 3.88 4.37  HGB 11.2* 10.6* 12.0  HCT 34.2* 32.7* 37.0  MCV 84.2 84.3 84.7  MCH 27.6 27.3 27.5  MCHC 32.7 32.4 32.4  RDW 14.4 14.6 14.4  PLT 251 264 296   Thyroid  Recent Labs  Lab 09/14/21 0310  TSH 1.091    BNP Recent Labs  Lab 09/13/21 2158  BNP 229.0*    DDimer No results for input(s): "DDIMER" in the last 168 hours.   Radiology    No results found.  Cardiac Studies   ECHO this admit  1. Severe asymmetric hypertrophy of the basal septum up to 50m. There is  evidence of SAM of the MV leaflets with obstruction up to 37 mmHG  (variation noted due to  afib). Findings are consistent with hypertrophic  obstructive cardiomyopathy. Would  consider a cardiac MRI for better characterization. This was not well  characterized on the last echocardiogram. Left ventricular ejection  fraction, by estimation, is 65 to 70%. The left ventricle has normal  function. The left ventricle has no regional  wall motion abnormalities. There is severe asymmetric left ventricular  hypertrophy of the basal-septal segment. Left ventricular diastolic  function could not be evaluated.   2. Right ventricular systolic function is normal. The right ventricular  size is normal.   3. Left atrial size was mildly dilated.   4. Right atrial size was mildly dilated.   5. The mitral valve is degenerative. Trivial mitral valve regurgitation.  No evidence of mitral stenosis.   6. The aortic valve is tricuspid. There is mild calcification of the  aortic valve. There is mild thickening of the aortic valve. Aortic valve  regurgitation is not visualized. Aortic valve sclerosis/calcification is  present, without any evidence of  aortic stenosis.   Patient Profile     76y.o. female here with atrial fibrillation in the setting of septic shock secondary to urinary tract infection.  Has schizophrenia recurrent DVTs psychosis midodrine for hypotension in the past.  Also has hypertrophic obstructive cardiomyopathy  Assessment & Plan    Atrial fibrillation paroxysmal - Overall better rate control.  She is on amiodarone 200 mg daily.  As sepsis improves heart rate will continue to improve. -Continue with beta-blocker metoprolol XL 125 mg once a day  Chronic anticoagulation - Continue with Eliquis 5 mg twice a day  Hypertrophic obstructive cardiomyopathy-LVOT gradient of 37 mmHg. - Preload dependent.  Avoiding IV Lasix.  Phenylephrine if needed for support.  Hypotension - On midodrine.  Schizophrenia - Per primary team      For questions or updates, please contact [email or web address]  consult [email or web address] for contact info under        Signed, Candee Furbish, MD  09/19/2021, 9:22 AM

## 2021-09-19 NOTE — Assessment & Plan Note (Signed)
Avoid hypotension Use BB for afib neosynephrine if hypotensive outpt cardiology follow up Seen by cardiology during this hospitalization

## 2021-09-19 NOTE — TOC Transition Note (Addendum)
Transition of Care [facility]) - CM/SW Discharge Note   Patient Details  Name: [name] MRN: [id number] Date of Birth: 1945-05-31  Transition of Care [facility]) CM/SW Contact:  Boneta Lucks, RN Phone Number: 09/19/2021, 12:55 PM   Clinical Narrative:   Patient is medically ready to return to [facility]. FL2 and discharge summary sent. Tammy is sending staff to pick up patient, RN updated. PT is recommending HHPT. They use [facility]. Lattie Haw updated.  Left message for legal guardian with discharge plan.   Addendum : Faxed DC summary to Legal Guardian.   Final next level of care: Assisted Living Barriers to Discharge: Barriers Resolved   Patient Goals and CMS Choice Patient states their goals for this hospitalization and ongoing recovery are:: to return to ALF CMS Medicare.gov Compare Post Acute Care list provided to:: Patient Choice offered to / list presented to : Patient  Discharge Placement                Patient to be transferred to facility by: [facility] Staff Name of family member notified: left message for [name] Patient and family notified of of transfer: 09/19/21  Discharge Plan and [place]            Readmission Risk Interventions    09/16/2021    2:02 PM 03/29/2021   10:51 AM 03/23/2021    2:15 PM  Readmission Risk Prevention Plan  Transportation Screening Complete Complete Complete  HRI or Home Care Consult   Complete  Social Work Consult for [place] Planning/Counseling   Complete  Palliative Care Screening   Not Applicable  Medication Review Press photographer) Complete  Complete  HRI or Home Care Consult Complete Complete   SW Recovery Care/Counseling Consult  Complete   Palliative Care Screening Not Applicable Not [place] Not Applicable Complete

## 2021-09-19 NOTE — Care Management Important Message (Signed)
Important Message  Patient Details  Name: [name] MRN: [id number] Date of Birth: 1945/02/08   Medicare Important Message Given:  Other (see comment) (copy given by admissions on 09/18/21)     [name] 09/19/2021, 2:47 PM

## 2021-09-19 NOTE — NC FL2 (Signed)
[employer] LEVEL OF CARE SCREENING TOOL     IDENTIFICATION  Patient Name: [name] Birthdate: 02-24-1945 Sex: female Admission Date (Current Location): 09/13/2021  [facility] and [place] Number:  [employer] and Address:  [place] [address], [place]      Provider Number: [id number]  Attending Physician Name and Address:  Orson Eva, MD  Relative Name and Phone Number:  [name] (Legal Guardian)   [phone]    Current Level of Care: Hospital Recommended Level of Care: Assisted Living Facility Prior Approval Number:    Date Approved/Denied:   PASRR Number:    Discharge Plan: Domiciliary (Rest home)    Current Diagnoses: Patient Active Problem List   Diagnosis Date Noted   HOCM (hypertrophic obstructive cardiomyopathy) ([place]) 09/19/2021   Acute tracheobronchitis 09/17/2021   Pressure injury of skin 09/16/2021   Goals of care, counseling/discussion 09/16/2021   IV infiltration 62/69/4854   Acute metabolic encephalopathy 62/70/3500   UTI (urinary tract infection) 93/81/8299   Acute diastolic CHF (congestive heart failure) ([place]) 09/14/2021   Septic shock ([place]) 09/13/2021   Pneumonia 08/14/2021   PNA (pneumonia) 08/13/2021   Opioid dependence ([place]) 07/24/2021   Prolonged QT interval 07/22/2021   Mood disorder ([place]) 05/18/2021   Fecal impaction ([place]) 04/23/2021   Acute respiratory failure with hypoxia ([place]) 03/22/2021   Paroxysmal atrial fibrillation ([place]) 03/22/2021   Hyperthyroidism    SBO (small bowel obstruction) ([place]) 04/18/2020   Atrial fibrillation with RVR ([place]) 10/04/2019   GERD (gastroesophageal reflux disease) 08/26/2019   Constipation 09/09/2018   Hypotensive episode 03/21/2018   Primary osteoarthritis of right hip 04/18/2017   History of cerebrovascular accident (CVA) with residual deficit 03/28/2017   Schizophrenia ([place]) 03/28/2017   Generalized weakness 03/28/2017   IBS (irritable  bowel syndrome) 10/16/2016   Left hemiparesis ([place]) 10/06/2016   Chronic superficial gastritis without bleeding    Stricture and stenosis of esophagus    IDA (iron deficiency anemia) 04/06/2015   Sedentary lifestyle 11/06/2014   Uncontrolled type 2 diabetes mellitus with hyperglycemia, with long-term current use of insulin ([place]) 03/24/2010   Essential hypertension 03/24/2010   DVT, lower extremity, recurrent ([place]) 03/24/2010    Orientation RESPIRATION BLADDER Height & Weight     Self, Time  Normal Continent Weight: 57.4 kg Height:  '5\' 7"'$  (170.2 cm)  BEHAVIORAL SYMPTOMS/MOOD NEUROLOGICAL BOWEL NUTRITION STATUS      Continent Diet  AMBULATORY STATUS COMMUNICATION OF NEEDS Skin     Verbally                         Personal Care Assistance Level of Assistance  Bathing, Feeding, Dressing Bathing Assistance: Limited assistance Feeding assistance: Independent Dressing Assistance: Limited assistance     Functional Limitations Info  Sight, Hearing, Speech Sight Info: Adequate Hearing Info: Adequate Speech Info: Adequate    SPECIAL CARE FACTORS FREQUENCY  PT (By licensed PT)     PT Frequency: 5 times a week              Contractures Contractures Info: Not present    Additional Factors Info  Code Status, Allergies, Psychotropic Code Status Info: DNR Allergies Info: Sulfa Psychotropic Info: Abilify, Buspar, Coloazepam, Cymbalta, Risperdal         Current Medications (09/19/2021):  This is the current hospital active medication list Current Facility-Administered Medications  Medication Dose Route Frequency Provider Last Rate Last Admin   acetaminophen (TYLENOL) tablet  650 mg  650 mg Oral Q6H PRN Zierle-Ghosh, Asia B, DO   650 mg at 09/17/21 3500   Or   acetaminophen (TYLENOL) suppository 650 mg  650 mg Rectal Q6H PRN Zierle-Ghosh, Asia B, DO   650 mg at 09/14/21 1107   amiodarone (PACERONE) tablet 200 mg  200 mg Oral Daily Tat, David, MD   200 mg at 09/19/21 0840    apixaban (ELIQUIS) tablet 5 mg  5 mg Oral BID Tat, Shanon Brow, MD   5 mg at 09/19/21 0840   ARIPiprazole (ABILIFY) tablet 2 mg  2 mg Oral Daily Kara Mead V, MD   2 mg at 09/19/21 0840   busPIRone (BUSPAR) tablet 7.5 mg  7.5 mg Oral BID Kara Mead V, MD   7.5 mg at 09/19/21 0842   cefTRIAXone (ROCEPHIN) 1 g in sodium chloride 0.9 % 100 mL IVPB  1 g Intravenous Q24H Rigoberto Noel, MD   Stopped at 09/18/21 1812   Chlorhexidine Gluconate Cloth 2 % PADS 6 each  6 each Topical Q0600 Zierle-Ghosh, Asia B, DO   6 each at 09/19/21 0100   clonazePAM (KLONOPIN) tablet 0.5 mg  0.5 mg Oral TID Rigoberto Noel, MD   0.5 mg at 09/19/21 0840   DULoxetine (CYMBALTA) DR capsule 60 mg  60 mg Oral Daily Rigoberto Noel, MD   60 mg at 09/19/21 0840   HYDROcodone-acetaminophen (NORCO/VICODIN) 5-325 MG per tablet 1 tablet  1 tablet Oral Q6H PRN Tat, Shanon Brow, MD   1 tablet at 09/19/21 0357   insulin aspart (novoLOG) injection 0-5 Units  0-5 Units Subcutaneous QHS Tat, Shanon Brow, MD       insulin aspart (novoLOG) injection 0-9 Units  0-9 Units Subcutaneous TID WC Tat, Shanon Brow, MD   2 Units at 09/19/21 0842   insulin detemir (LEVEMIR) injection 10 Units  10 Units Subcutaneous QHS Zierle-Ghosh, Asia B, DO   10 Units at 09/18/21 2108   metoprolol succinate (TOPROL-XL) 24 hr tablet 125 mg  125 mg Oral Daily Tat, Shanon Brow, MD   125 mg at 09/19/21 0840   midodrine (PROAMATINE) tablet 10 mg  10 mg Oral TID WC Orson Eva, MD   10 mg at 09/19/21 9381   mupirocin ointment (BACTROBAN) 2 %   Nasal BID Zierle-Ghosh, Asia B, DO   Given at 09/19/21 8299   nitrofurantoin (macrocrystal-monohydrate) (MACROBID) capsule 100 mg  100 mg Oral Therisa Doyne, MD   100 mg at 09/19/21 0840   Oral care mouth rinse  15 mL Mouth Rinse PRN Tat, Shanon Brow, MD       pantoprazole (PROTONIX) injection 40 mg  40 mg Intravenous Q12H Zierle-Ghosh, Asia B, DO   40 mg at 09/19/21 0843   potassium chloride SA (KLOR-CON M) CR tablet 40 mEq  40 mEq Oral BID Tat, Shanon Brow, MD   40  mEq at 09/19/21 0840   prochlorperazine (COMPAZINE) injection 5 mg  5 mg Intravenous Q6H PRN Tat, Shanon Brow, MD   5 mg at 09/19/21 3716     Discharge Medications:  diltiazem 120 MG 24 hr capsule Commonly known as: CARDIZEM CD           TAKE these medications     Abilify 2 MG tablet Generic drug: ARIPiprazole Take 1 tablet (2 mg total) by mouth daily. What changed: when to take this    acetaminophen 325 MG tablet Commonly known as: TYLENOL Take 650 mg by mouth 3 (three) times daily as needed for mild pain or  moderate pain.    albuterol 108 (90 Base) MCG/ACT inhaler Commonly known as: VENTOLIN HFA Inhale 2 puffs into the lungs every 6 (six) hours as needed for wheezing or shortness of breath.    amiodarone 200 MG tablet Commonly known as: PACERONE Take 1 tablet (200 mg total) by mouth daily.    apixaban 5 MG Tabs tablet Commonly known as: ELIQUIS Take 1 tablet (5 mg total) by mouth 2 (two) times daily.    atorvastatin 40 MG tablet Commonly known as: LIPITOR Take 1 tablet by mouth at bedtime.    BAZA PROTECT EX Apply 1 Application topically in the morning and at bedtime.    busPIRone 7.5 MG tablet Commonly known as: BUSPAR Take 7.5 mg by mouth 2 (two) times daily.    calcium carbonate 500 MG chewable tablet Commonly known as: TUMS - dosed in mg elemental calcium Chew 1 tablet by mouth 4 (four) times daily as needed for heartburn.    cholecalciferol 25 MCG (1000 UNIT) tablet Commonly known as: VITAMIN D3 Take 2,000 Units by mouth daily.    clonazePAM 0.5 MG tablet Commonly known as: KLONOPIN Take 1 tablet (0.5 mg total) by mouth 3 (three) times daily as needed for anxiety. What changed: when to take this    DULoxetine 60 MG capsule Commonly known as: CYMBALTA Take 1 capsule by mouth daily.    EasyMax Test test strip Generic drug: glucose blood USE TO CHECK BLOOD SUGAR FOUR TIMES DAILY.(HOLD IF BS<70: CALL MD IF BS> 400)    GoodSense Artificial Tears 5-6  MG/ML Soln Generic drug: Polyvinyl Alcohol-Povidone Apply 1 drop to eye 3 (three) times daily.    HYDROcodone-acetaminophen 10-325 MG tablet Commonly known as: NORCO Take 1 tablet by mouth every 8 (eight) hours as needed for severe pain. What changed: when to take this    Lancets Thin Misc USE TO CHECK BLOOD SUGAR FOUR TIMES DAILY.(HOLD IF BS<70: CALL MD IF BS> 400)    latanoprost 0.005 % ophthalmic solution Commonly known as: XALATAN Place 1 drop into both eyes at bedtime.    Levemir FlexTouch 100 UNIT/ML FlexPen Generic drug: insulin detemir INJECT 22 UNITS SUBCUTANEOUSLY AT BEDTIME.(HOLD IF BS<70: CALL MD IF BS> 400) What changed: See the new instructions.    loratadine 10 MG tablet Commonly known as: CLARITIN Take 10 mg by mouth daily.    lubiprostone 8 MCG capsule Commonly known as: AMITIZA Take 1 capsule by mouth daily.    metoCLOPramide 5 MG tablet Commonly known as: REGLAN Take 5 mg by mouth in the morning, at noon, and at bedtime. If zofran is not working    metoprolol succinate 25 MG 24 hr tablet Commonly known as: TOPROL-XL Take 5 tablets (125 mg total) by mouth daily. What changed:  medication strength how much to take additional instructions    midodrine 10 MG tablet Commonly known as: PROAMATINE Take 10 mg by mouth 3 (three) times daily.    mirtazapine 30 MG tablet Commonly known as: REMERON Take 30 mg by mouth at bedtime.    naloxegol oxalate 12.5 MG Tabs tablet Commonly known as: MOVANTIK Take 1 tablet (12.5 mg total) by mouth daily.    nitrofurantoin (macrocrystal-monohydrate) 100 MG capsule Commonly known as: MACROBID Take 1 capsule (100 mg total) by mouth every 12 (twelve) hours.    NovoLOG FlexPen 100 UNIT/ML FlexPen Generic drug: insulin aspart INJECT SUBCUTANEOUSLY AS FOLLOWS WITH MEALS: 90-150=10u: 151-200=11u: 201-250=12u: 251-300=13u: 301-350=14u: 351-400=15u: BS>400=16u & CALL MD. What changed: See the new  instructions.     ondansetron 4 MG tablet Commonly known as: ZOFRAN Take 4 mg by mouth 2 (two) times daily.    oxybutynin 5 MG 24 hr tablet Commonly known as: DITROPAN-XL Take 5 mg by mouth daily.    pantoprazole 40 MG tablet Commonly known as: PROTONIX Take 1 tablet (40 mg total) by mouth 2 (two) times daily before a meal.    polyethylene glycol 17 g packet Commonly known as: MIRALAX / GLYCOLAX Take 17 g by mouth daily.    risperiDONE 1 MG tablet Commonly known as: RISPERDAL Take 1-2 mg by mouth 2 (two) times daily. Take 1 tablet by mouth in the morning. Take 2 tablets by mouth at bedtime.    Santyl 250 UNIT/GM ointment Generic drug: collagenase Apply 1 Application topically daily.    senna 8.6 MG tablet Commonly known as: SENOKOT Take 1 tablet by mouth daily.    Tradjenta 5 MG Tabs tablet Generic drug: linagliptin TAKE (1) TABLET BY MOUTH ONCE DAILY. What changed: See the new instructions.             Relevant Imaging Results:  Relevant Lab Results:   Additional Information HHPT with Carmina Miller, RN

## 2021-09-21 ENCOUNTER — Emergency Department (HOSPITAL_COMMUNITY): Payer: Medicare Other

## 2021-09-21 ENCOUNTER — Other Ambulatory Visit: Payer: Self-pay

## 2021-09-21 ENCOUNTER — Ambulatory Visit: Payer: Medicare Other | Admitting: Gastroenterology

## 2021-09-21 ENCOUNTER — Encounter (HOSPITAL_COMMUNITY): Payer: Self-pay

## 2021-09-21 ENCOUNTER — Emergency Department (HOSPITAL_COMMUNITY)
Admission: EM | Admit: 2021-09-21 | Discharge: 2021-09-22 | Disposition: A | Discharge status: Skilled Nursing Facility | Payer: Medicare Other | Attending: Emergency Medicine | Admitting: Emergency Medicine

## 2021-09-21 DIAGNOSIS — E119 Type 2 diabetes mellitus without complications: Secondary | ICD-10-CM | POA: Insufficient documentation

## 2021-09-21 DIAGNOSIS — Z7984 Long term (current) use of oral hypoglycemic drugs: Secondary | ICD-10-CM | POA: Insufficient documentation

## 2021-09-21 DIAGNOSIS — Z7901 Long term (current) use of anticoagulants: Secondary | ICD-10-CM | POA: Diagnosis not present

## 2021-09-21 DIAGNOSIS — R410 Disorientation, unspecified: Secondary | ICD-10-CM | POA: Insufficient documentation

## 2021-09-21 DIAGNOSIS — I10 Essential (primary) hypertension: Secondary | ICD-10-CM | POA: Insufficient documentation

## 2021-09-21 DIAGNOSIS — Z794 Long term (current) use of insulin: Secondary | ICD-10-CM | POA: Diagnosis not present

## 2021-09-21 DIAGNOSIS — R2689 Other abnormalities of gait and mobility: Secondary | ICD-10-CM | POA: Insufficient documentation

## 2021-09-21 DIAGNOSIS — Z79899 Other long term (current) drug therapy: Secondary | ICD-10-CM | POA: Insufficient documentation

## 2021-09-21 DIAGNOSIS — R531 Weakness: Secondary | ICD-10-CM | POA: Diagnosis present

## 2021-09-21 LAB — CBC WITH DIFFERENTIAL/PLATELET
Abs Immature Granulocytes: 0.05 10*3/uL (ref 0.00–0.07)
Basophils Absolute: 0 10*3/uL (ref 0.0–0.1)
Basophils Relative: 0 %
Eosinophils Absolute: 0.3 10*3/uL (ref 0.0–0.5)
Eosinophils Relative: 4 %
HCT: 29.1 % — ABNORMAL LOW (ref 36.0–46.0)
Hemoglobin: 9.3 g/dL — ABNORMAL LOW (ref 12.0–15.0)
Immature Granulocytes: 1 %
Lymphocytes Relative: 30 %
Lymphs Abs: 2.1 10*3/uL (ref 0.7–4.0)
MCH: 27.4 pg (ref 26.0–34.0)
MCHC: 32 g/dL (ref 30.0–36.0)
MCV: 85.6 fL (ref 80.0–100.0)
Monocytes Absolute: 0.5 10*3/uL (ref 0.1–1.0)
Monocytes Relative: 8 %
Neutro Abs: 4.1 10*3/uL (ref 1.7–7.7)
Neutrophils Relative %: 57 %
Platelets: 423 10*3/uL — ABNORMAL HIGH (ref 150–400)
RBC: 3.4 MIL/uL — ABNORMAL LOW (ref 3.87–5.11)
RDW: 14.5 % (ref 11.5–15.5)
WBC: 7.1 10*3/uL (ref 4.0–10.5)
nRBC: 0 % (ref 0.0–0.2)

## 2021-09-21 LAB — COMPREHENSIVE METABOLIC PANEL
ALT: 35 U/L (ref 0–44)
AST: 33 U/L (ref 15–41)
Albumin: 3.6 g/dL (ref 3.5–5.0)
Alkaline Phosphatase: 91 U/L (ref 38–126)
Anion gap: 9 (ref 5–15)
BUN: 16 mg/dL (ref 8–23)
CO2: 25 mmol/L (ref 22–32)
Calcium: 8.8 mg/dL — ABNORMAL LOW (ref 8.9–10.3)
Chloride: 101 mmol/L (ref 98–111)
Creatinine, Ser: 0.84 mg/dL (ref 0.44–1.00)
GFR, Estimated: >60 mL/min (ref >60)
Glucose, Bld: 139 mg/dL — ABNORMAL HIGH (ref 70–99)
Potassium: 4.5 mmol/L (ref 3.5–5.1)
Sodium: 135 mmol/L (ref 135–145)
Total Bilirubin: 0.7 mg/dL (ref 0.3–1.2)
Total Protein: 7.5 g/dL (ref 6.5–8.1)

## 2021-09-21 LAB — URINALYSIS, ROUTINE W REFLEX MICROSCOPIC
Bilirubin Urine: NEGATIVE
Glucose, UA: NEGATIVE mg/dL
Hgb urine dipstick: NEGATIVE
Ketones, ur: NEGATIVE mg/dL
Leukocytes,Ua: NEGATIVE
Nitrite: NEGATIVE
Protein, ur: NEGATIVE mg/dL
Specific Gravity, Urine: 1.014 (ref 1.005–1.030)
pH: 6 (ref 5.0–8.0)

## 2021-09-21 LAB — PROTIME-INR
INR: 1.3 — ABNORMAL HIGH (ref 0.8–1.2)
Prothrombin Time: 15.6 seconds — ABNORMAL HIGH (ref 11.4–15.2)

## 2021-09-21 LAB — TROPONIN I (HIGH SENSITIVITY): Troponin I (High Sensitivity): 5 ng/L (ref <18)

## 2021-09-21 LAB — CBG MONITORING, ED
Glucose-Capillary: 109 mg/dL — ABNORMAL HIGH (ref 70–99)
Glucose-Capillary: 140 mg/dL — ABNORMAL HIGH (ref 70–99)

## 2021-09-21 LAB — LACTIC ACID, PLASMA: Lactic Acid, Venous: 1.7 mmol/L (ref 0.5–1.9)

## 2021-09-21 LAB — MAGNESIUM: Magnesium: 2.2 mg/dL (ref 1.7–2.4)

## 2021-09-21 MED ORDER — DULOXETINE HCL 30 MG PO CPEP
60.0000 mg | ORAL_CAPSULE | Freq: Every day | ORAL | Status: DC
  Administered 2021-09-21 – 2021-09-22 (×2): 60 mg via ORAL
  Filled 2021-09-21 (×2): qty 2

## 2021-09-21 MED ORDER — METOPROLOL SUCCINATE ER 50 MG PO TB24
125.0000 mg | ORAL_TABLET | Freq: Every day | ORAL | Status: DC
  Administered 2021-09-21 – 2021-09-22 (×2): 125 mg via ORAL
  Filled 2021-09-21 (×2): qty 1

## 2021-09-21 MED ORDER — BUSPIRONE HCL 5 MG PO TABS
7.5000 mg | ORAL_TABLET | Freq: Two times a day (BID) | ORAL | Status: DC
  Administered 2021-09-21 – 2021-09-22 (×2): 7.5 mg via ORAL
  Filled 2021-09-21 (×2): qty 2

## 2021-09-21 MED ORDER — INSULIN ASPART 100 UNIT/ML IJ SOLN
0.0000 [IU] | Freq: Three times a day (TID) | INTRAMUSCULAR | Status: DC
  Administered 2021-09-21: 2 [IU] via SUBCUTANEOUS
  Administered 2021-09-22 (×2): 3 [IU] via SUBCUTANEOUS
  Filled 2021-09-21 (×3): qty 1

## 2021-09-21 MED ORDER — MIDODRINE HCL 5 MG PO TABS: 10.0000 mg | ORAL_TABLET | Freq: Three times a day (TID) | ORAL | Status: DC

## 2021-09-21 MED ORDER — SENNA 8.6 MG PO TABS
1.0000 | ORAL_TABLET | Freq: Every day | ORAL | Status: DC
  Administered 2021-09-21 – 2021-09-22 (×2): 8.6 mg via ORAL
  Filled 2021-09-21 (×2): qty 1

## 2021-09-21 MED ORDER — RISPERIDONE 1 MG PO TABS
1.0000 mg | ORAL_TABLET | Freq: Every day | ORAL | Status: DC
  Administered 2021-09-22: 1 mg via ORAL
  Filled 2021-09-21: qty 1

## 2021-09-21 MED ORDER — METOCLOPRAMIDE HCL 10 MG PO TABS
5.0000 mg | ORAL_TABLET | Freq: Every day | ORAL | Status: DC | PRN
  Administered 2021-09-22: 5 mg via ORAL
  Filled 2021-09-21: qty 1

## 2021-09-21 MED ORDER — RISPERIDONE 1 MG PO TABS: 1.0000 mg | ORAL_TABLET | Freq: Two times a day (BID) | ORAL | Status: DC

## 2021-09-21 MED ORDER — ACETAMINOPHEN 325 MG PO TABS
650.0000 mg | ORAL_TABLET | Freq: Three times a day (TID) | ORAL | Status: DC | PRN
  Administered 2021-09-22: 650 mg via ORAL
  Filled 2021-09-21: qty 2

## 2021-09-21 MED ORDER — HYDROCODONE-ACETAMINOPHEN 10-325 MG PO TABS
1.0000 | ORAL_TABLET | Freq: Three times a day (TID) | ORAL | Status: DC | PRN
  Administered 2021-09-21 – 2021-09-22 (×3): 1 via ORAL
  Filled 2021-09-21 (×3): qty 1

## 2021-09-21 MED ORDER — APIXABAN 5 MG PO TABS
5.0000 mg | ORAL_TABLET | Freq: Two times a day (BID) | ORAL | Status: DC
  Administered 2021-09-21 – 2021-09-22 (×2): 5 mg via ORAL
  Filled 2021-09-21 (×2): qty 1

## 2021-09-21 MED ORDER — POLYETHYLENE GLYCOL 3350 17 G PO PACK
17.0000 g | PACK | Freq: Every day | ORAL | Status: DC
  Administered 2021-09-22: 17 g via ORAL
  Filled 2021-09-21 (×2): qty 1

## 2021-09-21 MED ORDER — CALCIUM CARBONATE ANTACID 500 MG PO CHEW: 1.0000 | CHEWABLE_TABLET | Freq: Four times a day (QID) | ORAL | Status: DC | PRN

## 2021-09-21 MED ORDER — ONDANSETRON HCL 4 MG/2ML IJ SOLN
4.0000 mg | Freq: Once | INTRAMUSCULAR | Status: AC
Start: 2021-09-21 — End: 2021-09-21
  Administered 2021-09-21: 4 mg via INTRAVENOUS
  Filled 2021-09-21: qty 2

## 2021-09-21 MED ORDER — PANTOPRAZOLE SODIUM 40 MG PO TBEC
40.0000 mg | DELAYED_RELEASE_TABLET | Freq: Two times a day (BID) | ORAL | Status: DC
  Administered 2021-09-21 – 2021-09-22 (×2): 40 mg via ORAL
  Filled 2021-09-21 (×2): qty 1

## 2021-09-21 MED ORDER — METOPROLOL SUCCINATE ER 50 MG PO TB24: 125.0000 mg | ORAL_TABLET | Freq: Every day | ORAL | Status: DC

## 2021-09-21 MED ORDER — LACTATED RINGERS IV BOLUS (SEPSIS)
1000.0000 mL | Freq: Once | INTRAVENOUS | Status: AC
Start: 2021-09-21 — End: 2021-09-21
  Administered 2021-09-21: 1000 mL via INTRAVENOUS

## 2021-09-21 MED ORDER — NITROFURANTOIN MONOHYD MACRO 100 MG PO CAPS
100.0000 mg | ORAL_CAPSULE | Freq: Two times a day (BID) | ORAL | Status: DC
  Administered 2021-09-21 – 2021-09-22 (×2): 100 mg via ORAL
  Filled 2021-09-21 (×2): qty 1

## 2021-09-21 MED ORDER — RISPERIDONE 1 MG PO TABS
2.0000 mg | ORAL_TABLET | Freq: Every day | ORAL | Status: DC
  Administered 2021-09-21: 2 mg via ORAL
  Filled 2021-09-21: qty 2

## 2021-09-21 MED ORDER — OXYBUTYNIN CHLORIDE ER 5 MG PO TB24
5.0000 mg | ORAL_TABLET | Freq: Every day | ORAL | Status: DC
  Administered 2021-09-21 – 2021-09-22 (×2): 5 mg via ORAL
  Filled 2021-09-21 (×2): qty 1

## 2021-09-21 MED ORDER — MIDODRINE HCL 5 MG PO TABS
10.0000 mg | ORAL_TABLET | Freq: Three times a day (TID) | ORAL | Status: DC
  Administered 2021-09-21 – 2021-09-22 (×3): 10 mg via ORAL
  Filled 2021-09-21 (×4): qty 2

## 2021-09-21 MED ORDER — INSULIN ASPART 100 UNIT/ML IJ SOLN: 0.0000 [IU] | Freq: Every day | INTRAMUSCULAR | Status: DC

## 2021-09-21 MED ORDER — ATORVASTATIN CALCIUM 40 MG PO TABS
40.0000 mg | ORAL_TABLET | Freq: Every day | ORAL | Status: DC
Start: 2021-09-21 — End: 2021-09-22
  Administered 2021-09-21: 40 mg via ORAL
  Filled 2021-09-21: qty 1

## 2021-09-21 MED ORDER — LORATADINE 10 MG PO TABS
10.0000 mg | ORAL_TABLET | Freq: Every day | ORAL | Status: DC
  Administered 2021-09-21 – 2021-09-22 (×2): 10 mg via ORAL
  Filled 2021-09-21 (×2): qty 1

## 2021-09-21 MED ORDER — AMIODARONE HCL 200 MG PO TABS
200.0000 mg | ORAL_TABLET | Freq: Every day | ORAL | Status: DC
Start: 2021-09-21 — End: 2021-09-22
  Administered 2021-09-21 – 2021-09-22 (×2): 200 mg via ORAL
  Filled 2021-09-21 (×2): qty 1

## 2021-09-21 MED ORDER — ALBUTEROL SULFATE HFA 108 (90 BASE) MCG/ACT IN AERS: 2.0000 | INHALATION_SPRAY | Freq: Four times a day (QID) | RESPIRATORY_TRACT | Status: DC | PRN

## 2021-09-21 NOTE — ED Triage Notes (Signed)
Per EMS, Pt, from [facility], c/o increased weakness.  Denies new pain.  Denies n/v/d.  Pt was discharged x2 days ago after being admitted for sepsis, UTI, and respiratory failure.

## 2021-09-21 NOTE — ED Notes (Signed)
Patient transported to MRI 

## 2021-09-21 NOTE — ED Provider Notes (Signed)
[facility] EMERGENCY DEPARTMENT Provider Note   CSN: [id number] Arrival date & time: 09/21/21  2993     History  Chief Complaint  Patient presents with   Weakness    [name] is a 76 y.o. female.   Weakness Patient presents for lethargy.  Medical history includes T2DM, HTN, DVT, anemia, schizophrenia, CVA, GERD, atrial fibrillation, arthritis, anxiety, depression.  She had a recent hospitalization, during which she was treated for urosepsis.  During this hospitalization, she was admitted to ICU and intubated.  She was discharged 2 days ago to [facility].  Facility states that she has been lethargic since her arrival.  She is reportedly conversant at baseline.  She is diabetic and did have an episode of hypoglycemia last night.  This seems to have resolved today.  Patient endorses chronic pain in her back.  She denies any areas of knee pain.  She denies any current nausea.      Home Medications Prior to Admission medications   Medication Sig Start Date End Date Taking? Authorizing Provider  ABILIFY 2 MG tablet Take 1 tablet (2 mg total) by mouth daily. Patient taking differently: Take 2 mg by mouth at bedtime. 11/09/20  Yes [name], MD  acetaminophen (TYLENOL) 325 MG tablet Take 650 mg by mouth 3 (three) times daily as needed for mild pain or moderate pain.    Yes [provider]  albuterol (VENTOLIN HFA) 108 (90 Base) MCG/ACT inhaler Inhale 2 puffs into the lungs every 6 (six) hours as needed for wheezing or shortness of breath. 05/20/21  Yes Barton Dubois, MD  amiodarone (PACERONE) 200 MG tablet Take 1 tablet (200 mg total) by mouth daily. 04/24/21  Yes Barton Dubois, MD  apixaban (ELIQUIS) 5 MG TABS tablet Take 1 tablet (5 mg total) by mouth 2 (two) times daily. 02/21/20  Yes Richarda Osmond, MD  atorvastatin (LIPITOR) 40 MG tablet Take 1 tablet by mouth at bedtime. 05/30/20  Yes [provider]  busPIRone (BUSPAR) 7.5 MG  tablet Take 7.5 mg by mouth 2 (two) times daily. 08/31/21  Yes [provider]  calcium carbonate (TUMS - DOSED IN MG ELEMENTAL CALCIUM) 500 MG chewable tablet Chew 1 tablet by mouth 4 (four) times daily as needed for heartburn.   Yes [provider]  cholecalciferol (VITAMIN D3) 25 MCG (1000 UNIT) tablet Take 2,000 Units by mouth daily.   Yes [provider]  clonazePAM (KLONOPIN) 0.5 MG tablet Take 1 tablet (0.5 mg total) by mouth 3 (three) times daily as needed for anxiety. Patient taking differently: Take 0.5 mg by mouth 3 (three) times daily. 05/20/21  Yes Barton Dubois, MD  DULoxetine (CYMBALTA) 60 MG capsule Take 1 capsule by mouth daily. 08/26/21  Yes [provider]  GOODSENSE ARTIFICIAL TEARS 0.5-0.6 % SOLN Apply 1 drop to eye 3 (three) times daily. 06/03/21  Yes [provider]  HYDROcodone-acetaminophen (NORCO) 10-325 MG tablet Take 1 tablet by mouth every 8 (eight) hours as needed for severe pain. Patient taking differently: Take 1 tablet by mouth in the morning, at noon, and at bedtime. 05/20/21  Yes Barton Dubois, MD  insulin detemir (LEVEMIR FLEXTOUCH) 100 UNIT/ML FlexPen INJECT 22 UNITS SUBCUTANEOUSLY AT BEDTIME.(HOLD IF BS<70: CALL MD IF BS> 400) Patient taking differently: 22 Units at bedtime. 07/15/21  Yes Reardon, Juanetta Beets, NP  latanoprost (XALATAN) 0.005 % ophthalmic solution Place 1 drop into both eyes at bedtime.   Yes [provider]  loratadine (  CLARITIN) 10 MG tablet Take 10 mg by mouth daily.   Yes [provider]  lubiprostone (AMITIZA) 8 MCG capsule Take 1 capsule by mouth daily. 08/10/21  Yes [provider]  metoCLOPramide (REGLAN) 5 MG tablet Take 5 mg by mouth daily as needed for nausea or vomiting.   Yes [provider]  metoprolol succinate (TOPROL-XL) 25 MG 24 hr tablet Take 5 tablets (125 mg total) by mouth daily. 09/19/21  Yes Tat, Shanon Brow, MD  midodrine (PROAMATINE) 10 MG tablet Take 10 mg  by mouth 3 (three) times daily.   Yes [provider]  mirtazapine (REMERON) 30 MG tablet Take 30 mg by mouth at bedtime.   Yes [provider]  naloxegol oxalate (MOVANTIK) 12.5 MG TABS tablet Take 1 tablet (12.5 mg total) by mouth daily. 04/24/21  Yes Barton Dubois, MD  nitrofurantoin, macrocrystal-monohydrate, (MACROBID) 100 MG capsule Take 1 capsule (100 mg total) by mouth every 12 (twelve) hours. 09/19/21  Yes Tat, Shanon Brow, MD  NOVOLOG FLEXPEN 100 UNIT/ML FlexPen INJECT SUBCUTANEOUSLY AS FOLLOWS WITH MEALS: 90-150=10u: 151-200=11u: 201-250=12u: 251-300=13u: 301-350=14u: 351-400=15u: BS>400=16u & CALL MD. Patient taking differently: 10-16 Units 3 (three) times daily with meals. INJECT SUBCUTANEOUSLY AS FOLLOWS WITH MEALS: 90-150=10u: 151-200=11u: 201-250=12u: 251-300=13u: 301-350=14u: 351-400=15u: BS>400=16u & CALL MD. 02/28/21  Yes Reardon, Juanetta Beets, NP  ondansetron (ZOFRAN) 4 MG tablet Take 4 mg by mouth 2 (two) times daily. 06/24/21  Yes [provider]  oxybutynin (DITROPAN-XL) 5 MG 24 hr tablet Take 5 mg by mouth daily. 04/20/21  Yes [provider]  pantoprazole (PROTONIX) 40 MG tablet Take 1 tablet (40 mg total) by mouth 2 (two) times daily before a meal. 11/17/20  Yes Mahala Menghini, PA-C  polyethylene glycol (MIRALAX / GLYCOLAX) 17 g packet Take 17 g by mouth daily. 04/24/21  Yes Barton Dubois, MD  risperiDONE (RISPERDAL) 1 MG tablet Take 1-2 mg by mouth 2 (two) times daily. Take 1 tablet by mouth in the morning. Take 2 tablets by mouth at bedtime. 04/04/21  Yes [provider]  SANTYL 250 UNIT/GM ointment Apply 1 Application topically daily. 07/05/21  Yes [provider]  senna (SENOKOT) 8.6 MG tablet Take 1 tablet by mouth daily.   Yes [provider]  Skin Protectants, Misc. (BAZA PROTECT EX) Apply 1 Application topically in the morning and at bedtime. 08/02/21  Yes [provider]  TRADJENTA 5 MG TABS tablet TAKE (1) TABLET  BY MOUTH ONCE DAILY. Patient taking differently: Take 5 mg by mouth daily. 04/04/21  Yes Reardon, Juanetta Beets, NP  EASYMAX TEST test strip USE TO CHECK BLOOD SUGAR FOUR TIMES DAILY.(HOLD IF BS<70: CALL MD IF BS> 400) 07/21/21   Brita Romp, NP  Lancets Thin MISC USE TO CHECK BLOOD SUGAR FOUR TIMES DAILY.(HOLD IF BS<70: CALL MD IF BS> 400) 09/13/21   Brita Romp, NP      Allergies    Sulfa antibiotics    Review of Systems   Review of Systems  Unable to perform ROS: Mental status change  Constitutional:  Positive for activity change.    Physical Exam Updated Vital Signs BP 109/73   Pulse 92   Temp 97.8 F (36.6 C) (Oral)   Resp (!) 23   Ht '5\' 7"'$  (1.702 m)   Wt 57.2 kg   SpO2 90%   BMI 19.73 kg/m  Physical Exam Vitals and nursing note reviewed.  Constitutional:      General: She is not in acute distress.  Appearance: She is well-developed and normal weight. She is ill-appearing. She is not toxic-appearing or diaphoretic.  HENT:     Head: Normocephalic and atraumatic.     Right Ear: External ear normal.     Left Ear: External ear normal.     Nose: Nose normal.     Mouth/Throat:     Mouth: Mucous membranes are dry.     Pharynx: Oropharynx is clear.  Eyes:     Extraocular Movements: Extraocular movements intact.     Conjunctiva/sclera: Conjunctivae normal.  Cardiovascular:     Rate and Rhythm: Normal rate and regular rhythm.     Heart sounds: No murmur heard. Pulmonary:     Effort: Pulmonary effort is normal. No respiratory distress.     Breath sounds: Normal breath sounds. No wheezing or rales.  Chest:     Chest wall: No tenderness.  Abdominal:     General: There is no distension.     Palpations: Abdomen is soft.     Tenderness: There is no abdominal tenderness.  Musculoskeletal:        General: No swelling, tenderness or deformity.     Cervical back: Neck supple. No rigidity.  Skin:    General: Skin is warm and dry.     Capillary Refill: Capillary  refill takes less than 2 seconds.     Coloration: Skin is pale. Skin is not jaundiced.  Neurological:     General: No focal deficit present.     Mental Status: She is alert. She is disoriented.     Cranial Nerves: No cranial nerve deficit.     Sensory: No sensory deficit.     Motor: Weakness (Global) present.  Psychiatric:        Mood and Affect: Mood normal.        Behavior: Behavior normal.     ED Results / Procedures / Treatments   Labs (all labs ordered are listed, but only abnormal results are displayed) Labs Reviewed  COMPREHENSIVE METABOLIC PANEL - Abnormal; Notable for the following components:      Result Value   Glucose, Bld 139 (*)    Calcium 8.8 (*)    All other components within normal limits  CBC WITH DIFFERENTIAL/PLATELET - Abnormal; Notable for the following components:   RBC 3.40 (*)    Hemoglobin 9.3 (*)    HCT 29.1 (*)    Platelets 423 (*)    All other components within normal limits  PROTIME-INR - Abnormal; Notable for the following components:   Prothrombin Time 15.6 (*)    INR 1.3 (*)    All other components within normal limits  CBG MONITORING, ED - Abnormal; Notable for the following components:   Glucose-Capillary 109 (*)    All other components within normal limits  CULTURE, BLOOD (ROUTINE X 2)  CULTURE, BLOOD (ROUTINE X 2)  URINE CULTURE  LACTIC ACID, PLASMA  URINALYSIS, ROUTINE W REFLEX MICROSCOPIC  MAGNESIUM  TROPONIN I (HIGH SENSITIVITY)    EKG EKG Interpretation  Date/Time:  Wednesday September 21 2021 09:09:34 EDT Ventricular Rate:  92 PR Interval:    QRS Duration: 97 QT Interval:  384 QTC Calculation: 488 R Axis:   78 Text Interpretation: Atrial flutter Low voltage, extremity leads Borderline prolonged QT interval Confirmed by Godfrey Pick ([id number]) on 09/21/2021 9:36:32 AM  Radiology MR BRAIN WO CONTRAST  Result Date: 09/21/2021 CLINICAL DATA:  Mental status change, unknown cause EXAM: MRI HEAD WITHOUT CONTRAST TECHNIQUE:  Multiplanar, multiecho pulse sequences of the  brain and surrounding structures were obtained without intravenous contrast. COMPARISON:  2018 FINDINGS: Brain: There is no acute infarction or intracranial hemorrhage. There is no intracranial mass, mass effect, or edema. There is no hydrocephalus or extra-axial fluid collection. Prominence of the ventricles and sulci reflects similar parenchymal volume loss. Foci of susceptibility the left corona radiata and thalamus likely reflect chronic microhemorrhages. Patchy and confluent areas of T2 hyperintensity in the supratentorial white matter are nonspecific probably reflect similar moderate chronic microvascular ischemic changes there are chronic small vessel infarcts the central white matter bilaterally. Vascular: Major vessel flow voids at the skull base are preserved. Skull and upper cervical spine: Normal marrow signal is preserved. Sinuses/Orbits: Evidence of prior sinonasal surgery. Orbits are unremarkable. Other: Sella is unremarkable.  Trace mastoid fluid opacification. IMPRESSION: No acute infarction, hemorrhage, or mass. Similar chronic microvascular ischemic changes, small infarcts, and parenchymal volume loss. Electronically Signed   By: Macy Mis M.D.   On: 09/21/2021 13:31   CT Head Wo Contrast  Result Date: 09/21/2021 CLINICAL DATA:  Altered mental status, weakness. EXAM: CT HEAD WITHOUT CONTRAST TECHNIQUE: Contiguous axial images were obtained from the base of the skull through the vertex without intravenous contrast. RADIATION DOSE REDUCTION: This exam was performed according to the departmental dose-optimization program which includes automated exposure control, adjustment of the mA and/or kV according to patient size and/or use of iterative reconstruction technique. COMPARISON:  September 13, 2021. FINDINGS: Brain: Mild chronic ischemic white matter disease is noted. No mass effect or midline shift is noted. Ventricular size is within normal  limits. There is no evidence of mass lesion, hemorrhage or acute infarction. Vascular: No hyperdense vessel or unexpected calcification. Skull: Normal. Negative for fracture or focal lesion. Sinuses/Orbits: No acute finding. Other: None. IMPRESSION: No acute intracranial abnormality seen. Electronically Signed   By: Marijo Conception M.D.   On: 09/21/2021 10:42   DG Chest Port 1 View  Result Date: 09/21/2021 CLINICAL DATA:  Questionable sepsis - evaluate for abnormality EXAM: PORTABLE CHEST 1 VIEW COMPARISON:  09/13/2021 FINDINGS: Heart borderline enlarged. Aortic atherosclerosis. No confluent airspace opacities or effusions. No acute bony abnormality. IMPRESSION: No active disease. Electronically Signed   By: Rolm Baptise M.D.   On: 09/21/2021 09:19    Procedures Procedures    Medications Ordered in ED Medications  acetaminophen (TYLENOL) tablet 650 mg (has no administration in time range)  albuterol (VENTOLIN HFA) 108 (90 Base) MCG/ACT inhaler 2 puff (has no administration in time range)  amiodarone (PACERONE) tablet 200 mg (has no administration in time range)  apixaban (ELIQUIS) tablet 5 mg (has no administration in time range)  atorvastatin (LIPITOR) tablet 40 mg (has no administration in time range)  busPIRone (BUSPAR) tablet 7.5 mg (has no administration in time range)  calcium carbonate (TUMS - dosed in mg elemental calcium) chewable tablet 200 mg of elemental calcium (has no administration in time range)  DULoxetine (CYMBALTA) DR capsule 60 mg (has no administration in time range)  HYDROcodone-acetaminophen (NORCO) 10-325 MG per tablet 1 tablet (has no administration in time range)  metoCLOPramide (REGLAN) tablet 5 mg (has no administration in time range)  loratadine (CLARITIN) tablet 10 mg (has no administration in time range)  metoprolol succinate (TOPROL-XL) 24 hr tablet 125 mg (has no administration in time range)  midodrine (PROAMATINE) tablet 10 mg (has no administration in time  range)  nitrofurantoin (macrocrystal-monohydrate) (MACROBID) capsule 100 mg (has no administration in time range)  oxybutynin (DITROPAN-XL) 24 hr tablet 5 mg (  has no administration in time range)  pantoprazole (PROTONIX) EC tablet 40 mg (has no administration in time range)  polyethylene glycol (MIRALAX / GLYCOLAX) packet 17 g (has no administration in time range)  risperiDONE (RISPERDAL) tablet 1-2 mg (has no administration in time range)  senna (SENOKOT) tablet 8.6 mg (has no administration in time range)  insulin aspart (novoLOG) injection 0-15 Units (has no administration in time range)  insulin aspart (novoLOG) injection 0-5 Units (has no administration in time range)  lactated ringers bolus 1,000 mL (0 mLs Intravenous Stopped 09/21/21 1059)    ED Course/ Medical Decision Making/ A&P                           Medical Decision Making Amount and/or Complexity of Data Reviewed Labs: ordered. Radiology: ordered. ECG/medicine tests: ordered.   This patient presents to the ED for concern of generalized weakness, this involves an extensive number of treatment options, and is a complaint that carries with it a high risk of complications and morbidity.  The differential diagnosis includes deconditioning, dehydration, infection, CVA, ICH, hypoglycemia, electrolyte abnormalities, polypharmacy, medication withdrawal   Co morbidities that complicate the patient evaluation  T2DM, HTN, DVT, anemia, schizophrenia, CVA, GERD, atrial fibrillation, arthritis, anxiety, depression   Additional history obtained:  Additional history obtained from EMS External records from outside source obtained and reviewed including EMR   Lab Tests:  I Ordered, and personally interpreted labs.  The pertinent results include: Baseline anemia, no leukocytosis, normal kidney function, normal electrolytes, no evidence of UTI, normal lactate, normal troponin   Imaging Studies ordered:  I ordered imaging studies  including chest x-ray, CT head, MRI brain I independently visualized and interpreted imaging which showed no acute findings I agree with the radiologist interpretation   Cardiac Monitoring: / EKG:  The patient was maintained on a cardiac monitor.  I personally viewed and interpreted the cardiac monitored which showed an underlying rhythm of: Atrial flutter   Problem List / ED Course / Critical interventions / Medication management  Patient is a 76 year old female presenting from assisted living facility for generalized weakness.  She had a recent hospitalization for urosepsis.  During this hospitalization, she required intubation, pressors, and ICU care.  She was discharged 2 days ago back to her assisted living facility.  EMS reports that she has had generalized weakness and lethargy since arriving back.  She had a transient episode of hypoglycemia last night which has resolved.  On arrival, patient endorses chronic back pain but denies any other areas of discomfort.  Her breathing is unlabored.  Her vital signs are normal.  Broad diagnostic work-up was initiated.  Patient's lab work is all reassuring with no evidence of recurrent UTI, no leukocytosis, baseline anemia, and normal electrolytes.  She underwent CT imaging of head, chest x-ray, and MRI brain.  Results of the studies were also reassuring.  I suspect that patient has undergone deconditioning following her hospitalization for severe illness.  She likely needs a higher level of care.  Physical therapy evaluation was ordered.  TOC consult was ordered.  Patient to remain in the ED waiting social work disposition.  Home medications ordered. I ordered medication including IV fluids for hydration Reevaluation of the patient after these medicines showed that the patient improved I have reviewed the patients home medicines and have made adjustments as needed   Social Determinants of Health:  Recent hospitalization for severe  illness  Final Clinical Impression(s) / ED Diagnoses Final diagnoses:  Generalized weakness    Rx / DC Orders ED Discharge Orders     None         Godfrey Pick, MD 09/21/21 1651

## 2021-09-21 NOTE — ED Notes (Signed)
Spoke to Associate Professor.  [facility] is only an assisted living.  Pt has needed maximum assistance w/ all care today.  Director feels she would benefit from rehab which they cannot provide.  EDP made aware of situation.

## 2021-09-21 NOTE — ED Notes (Signed)
Fluid stopped by EDP.

## 2021-09-21 NOTE — ED Notes (Addendum)
Pt's daughter called and was asking about pt status; informed her pt is stable and is awaiting for SNF placement  Pt's daughter is Santiago Glad and may be reached at [phone]

## 2021-09-21 NOTE — Social Work (Signed)
CSW spoke to provider, Provider will put Pt order in for this Pt. TOC will continue to follow.

## 2021-09-21 NOTE — ED Notes (Signed)
Pt was able to take medications in vanilla pudding w/o incident.

## 2021-09-22 DIAGNOSIS — R531 Weakness: Secondary | ICD-10-CM | POA: Diagnosis not present

## 2021-09-22 LAB — URINE CULTURE: Culture: NO GROWTH

## 2021-09-22 LAB — CBG MONITORING, ED
Glucose-Capillary: 116 mg/dL — ABNORMAL HIGH (ref 70–99)
Glucose-Capillary: 154 mg/dL — ABNORMAL HIGH (ref 70–99)
Glucose-Capillary: 183 mg/dL — ABNORMAL HIGH (ref 70–99)

## 2021-09-22 MED ORDER — HYDROCODONE-ACETAMINOPHEN 10-325 MG PO TABS: 1.0000 | ORAL_TABLET | Freq: Three times a day (TID) | ORAL | 0 refills | Status: AC | PRN

## 2021-09-22 MED ORDER — MIRTAZAPINE 15 MG PO TABS: 30.0000 mg | ORAL_TABLET | Freq: Every day | ORAL | Status: DC

## 2021-09-22 MED ORDER — CLONAZEPAM 0.5 MG PO TABS: 0.5000 mg | ORAL_TABLET | Freq: Three times a day (TID) | ORAL | 0 refills | Status: DC | PRN

## 2021-09-22 MED ORDER — CLONAZEPAM 0.5 MG PO TABS
0.5000 mg | ORAL_TABLET | Freq: Three times a day (TID) | ORAL | Status: DC | PRN
  Administered 2021-09-22 (×2): 0.5 mg via ORAL
  Filled 2021-09-22 (×2): qty 1

## 2021-09-22 NOTE — ED Notes (Cosign Needed)
RE: [name]  Date Of Birth: 1945-01-26 Date: 09/22/2021  MUST ID: [id number]  To Whom It May Concern:   Please be advised that the above name patient will require a short-term nursing home stay - anticipated 30 days or less rehabilitation and strengthening. The plan is for return home.

## 2021-09-22 NOTE — ED Notes (Addendum)
CSW spoke with [facility] crisis line to inform that [facility] will not accept pt back as they feel pt needs a higher level of care. [facility] is agreeable to SNF referral being sent out and would like pt to stay local if possible but understand if pt cannot. CSW to send pt SNF referral out once PT has made level of care recommendation. TOC to follow.   Addendum 11:30am: CSW completed Fl2. Once PT notes are in CSW will send referral out for SNF. Pts PASRR has been sent and is under review at this time. TOC to follow.

## 2021-09-22 NOTE — NC FL2 (Signed)
[place] LEVEL OF CARE SCREENING TOOL     IDENTIFICATION  Patient Name: [name] Birthdate: 12-01-1945 Sex: female Admission Date (Current Location): 09/21/2021  [facility] and [place] Number:  [employer] and Address:  [place] [address] [place]      Provider Number: [id number]  Attending Physician Name and Address:  Default, Provider, MD  Relative Name and Phone Number:  [name] (Legal Guardian) [phone]    Current Level of Care: Hospital Recommended Level of Care: [place] Prior Approval Number:    Date Approved/Denied:   PASRR Number:    Discharge Plan: SNF    Current Diagnoses: Patient Active Problem List   Diagnosis Date Noted   HOCM (hypertrophic obstructive cardiomyopathy) ([place]) 09/19/2021   Acute tracheobronchitis 09/17/2021   Pressure injury of skin 09/16/2021   Goals of care, counseling/discussion 09/16/2021   IV infiltration 85/88/5027   Acute metabolic encephalopathy 74/12/8784   UTI (urinary tract infection) 76/72/0947   Acute diastolic CHF (congestive heart failure) ([place]) 09/14/2021   Septic shock ([place]) 09/13/2021   Pneumonia 08/14/2021   PNA (pneumonia) 08/13/2021   Opioid dependence ([place]) 07/24/2021   Prolonged QT interval 07/22/2021   Mood disorder ([place]) 05/18/2021   Fecal impaction ([place]) 04/23/2021   Acute respiratory failure with hypoxia ([place]) 03/22/2021   Paroxysmal atrial fibrillation ([place]) 03/22/2021   Hyperthyroidism    SBO (small bowel obstruction) ([place]) 04/18/2020   Atrial fibrillation with RVR ([place]) 10/04/2019   GERD (gastroesophageal reflux disease) 08/26/2019   Constipation 09/09/2018   Hypotensive episode 03/21/2018   Primary osteoarthritis of right hip 04/18/2017   History of cerebrovascular accident (CVA) with residual deficit 03/28/2017   Schizophrenia ([place]) 03/28/2017   Generalized weakness 03/28/2017   IBS (irritable bowel syndrome)  10/16/2016   Left hemiparesis ([place]) 10/06/2016   Chronic superficial gastritis without bleeding    Stricture and stenosis of esophagus    IDA (iron deficiency anemia) 04/06/2015   Sedentary lifestyle 11/06/2014   Uncontrolled type 2 diabetes mellitus with hyperglycemia, with long-term current use of insulin ([place]) 03/24/2010   Essential hypertension 03/24/2010   DVT, lower extremity, recurrent ([place]) 03/24/2010    Orientation RESPIRATION BLADDER Height & Weight     Self, Time, Place  Normal Continent Weight: 126 lb (57.2 kg) Height:  '5\' 7"'$  (170.2 cm)  BEHAVIORAL SYMPTOMS/MOOD NEUROLOGICAL BOWEL NUTRITION STATUS      Continent Diet (Regular)  AMBULATORY STATUS COMMUNICATION OF NEEDS Skin   Extensive Assist Verbally Normal                       Personal Care Assistance Level of Assistance  Bathing, Feeding, Dressing Bathing Assistance: Maximum assistance Feeding assistance: Independent Dressing Assistance: Maximum assistance     Functional Limitations Info  Sight, Hearing, Speech Sight Info: Adequate Hearing Info: Adequate Speech Info: Adequate    SPECIAL CARE FACTORS FREQUENCY  PT (By licensed PT), OT (By licensed OT)     PT Frequency: 5 times weekly OT Frequency: 5 times weekly            Contractures Contractures Info: Not present    Additional Factors Info  Code Status, Allergies, Psychotropic Code Status Info: DNR Allergies Info: Sulfa Antibiotics Psychotropic Info: Abilify, Buspar, Coloazepam, Cymbalta, Risperdal         Current Medications (09/22/2021):  This is the current hospital active medication list Current Facility-Administered Medications  Medication Dose Route Frequency Provider Last Rate Last  Admin   acetaminophen (TYLENOL) tablet 650 mg  650 mg Oral TID PRN Godfrey Pick, MD   650 mg at 09/22/21 0934   albuterol (VENTOLIN HFA) 108 (90 Base) MCG/ACT inhaler 2 puff  2 puff Inhalation Q6H PRN Godfrey Pick, MD       amiodarone (PACERONE) tablet  200 mg  200 mg Oral Daily Godfrey Pick, MD   200 mg at 09/22/21 0934   apixaban (ELIQUIS) tablet 5 mg  5 mg Oral BID Godfrey Pick, MD   5 mg at 09/22/21 2671   atorvastatin (LIPITOR) tablet 40 mg  40 mg Oral QHS Godfrey Pick, MD   40 mg at 09/21/21 2200   busPIRone (BUSPAR) tablet 7.5 mg  7.5 mg Oral BID Godfrey Pick, MD   7.5 mg at 09/22/21 2458   calcium carbonate (TUMS - dosed in mg elemental calcium) chewable tablet 200 mg of elemental calcium  1 tablet Oral QID PRN Godfrey Pick, MD       clonazePAM Bobbye [place]) tablet 0.5 mg  0.5 mg Oral TID PRN Mesner, Corene Cornea, MD   0.5 mg at 09/22/21 0553   DULoxetine (CYMBALTA) DR capsule 60 mg  60 mg Oral Daily Godfrey Pick, MD   60 mg at 09/22/21 0934   HYDROcodone-acetaminophen (NORCO) 10-325 MG per tablet 1 tablet  1 tablet Oral Q8H PRN Godfrey Pick, MD   1 tablet at 09/22/21 0530   insulin aspart (novoLOG) injection 0-15 Units  0-15 Units Subcutaneous TID WC Godfrey Pick, MD   3 Units at 09/22/21 0935   insulin aspart (novoLOG) injection 0-5 Units  0-5 Units Subcutaneous QHS Godfrey Pick, MD       loratadine (CLARITIN) tablet 10 mg  10 mg Oral Daily Godfrey Pick, MD   10 mg at 09/22/21 0934   metoCLOPramide (REGLAN) tablet 5 mg  5 mg Oral Daily PRN Godfrey Pick, MD   5 mg at 09/22/21 0998   metoprolol succinate (TOPROL-XL) 24 hr tablet 125 mg  125 mg Oral Daily Godfrey Pick, MD   125 mg at 09/22/21 0933   midodrine (PROAMATINE) tablet 10 mg  10 mg Oral TID WC Godfrey Pick, MD   10 mg at 09/22/21 0935   mirtazapine (REMERON) tablet 30 mg  30 mg Oral QHS Mesner, Corene Cornea, MD       nitrofurantoin (macrocrystal-monohydrate) (MACROBID) capsule 100 mg  100 mg Oral Q12H Godfrey Pick, MD   100 mg at 09/22/21 0934   oxybutynin (DITROPAN-XL) 24 hr tablet 5 mg  5 mg Oral Daily Godfrey Pick, MD   5 mg at 09/22/21 0935   pantoprazole (PROTONIX) EC tablet 40 mg  40 mg Oral BID AC Godfrey Pick, MD   40 mg at 09/22/21 0934   polyethylene glycol (MIRALAX / GLYCOLAX) packet 17 g  17 g Oral  Daily Godfrey Pick, MD   17 g at 09/22/21 0936   risperiDONE (RISPERDAL) tablet 1 mg  1 mg Oral Daily Godfrey Pick, MD   1 mg at 09/22/21 0935   risperiDONE (RISPERDAL) tablet 2 mg  2 mg Oral QHS Godfrey Pick, MD   2 mg at 09/21/21 2200   senna (SENOKOT) tablet 8.6 mg  1 tablet Oral Daily Godfrey Pick, MD   8.6 mg at 09/22/21 3382   Current Outpatient Medications  Medication Sig Dispense Refill   ABILIFY 2 MG tablet Take 1 tablet (2 mg total) by mouth daily. (Patient taking differently: Take 2 mg by mouth at bedtime.) 30 tablet 5  acetaminophen (TYLENOL) 325 MG tablet Take 650 mg by mouth 3 (three) times daily as needed for mild pain or moderate pain.      albuterol (VENTOLIN HFA) 108 (90 Base) MCG/ACT inhaler Inhale 2 puffs into the lungs every 6 (six) hours as needed for wheezing or shortness of breath. 8 g 1   amiodarone (PACERONE) 200 MG tablet Take 1 tablet (200 mg total) by mouth daily.     apixaban (ELIQUIS) 5 MG TABS tablet Take 1 tablet (5 mg total) by mouth 2 (two) times daily. 60 tablet 0   atorvastatin (LIPITOR) 40 MG tablet Take 1 tablet by mouth at bedtime.     busPIRone (BUSPAR) 7.5 MG tablet Take 7.5 mg by mouth 2 (two) times daily.     calcium carbonate (TUMS - DOSED IN MG ELEMENTAL CALCIUM) 500 MG chewable tablet Chew 1 tablet by mouth 4 (four) times daily as needed for heartburn.     cholecalciferol (VITAMIN D3) 25 MCG (1000 UNIT) tablet Take 2,000 Units by mouth daily.     clonazePAM (KLONOPIN) 0.5 MG tablet Take 1 tablet (0.5 mg total) by mouth 3 (three) times daily as needed for anxiety. (Patient taking differently: Take 0.5 mg by mouth 3 (three) times daily.) 10 tablet 0   DULoxetine (CYMBALTA) 60 MG capsule Take 1 capsule by mouth daily.     GOODSENSE ARTIFICIAL TEARS 0.5-0.6 % SOLN Apply 1 drop to eye 3 (three) times daily.     HYDROcodone-acetaminophen (NORCO) 10-325 MG tablet Take 1 tablet by mouth every 8 (eight) hours as needed for severe pain. (Patient taking  differently: Take 1 tablet by mouth in the morning, at noon, and at bedtime.) 10 tablet 0   insulin detemir (LEVEMIR FLEXTOUCH) 100 UNIT/ML FlexPen INJECT 22 UNITS SUBCUTANEOUSLY AT BEDTIME.(HOLD IF BS<70: CALL MD IF BS> 400) (Patient taking differently: 22 Units at bedtime.) 15 mL 2   latanoprost (XALATAN) 0.005 % ophthalmic solution Place 1 drop into both eyes at bedtime.     loratadine (CLARITIN) 10 MG tablet Take 10 mg by mouth daily.     lubiprostone (AMITIZA) 8 MCG capsule Take 1 capsule by mouth daily.     metoCLOPramide (REGLAN) 5 MG tablet Take 5 mg by mouth daily as needed for nausea or vomiting.     metoprolol succinate (TOPROL-XL) 25 MG 24 hr tablet Take 5 tablets (125 mg total) by mouth daily. 150 tablet 1   midodrine (PROAMATINE) 10 MG tablet Take 10 mg by mouth 3 (three) times daily.     mirtazapine (REMERON) 30 MG tablet Take 30 mg by mouth at bedtime.     naloxegol oxalate (MOVANTIK) 12.5 MG TABS tablet Take 1 tablet (12.5 mg total) by mouth daily. 30 tablet 1   nitrofurantoin, macrocrystal-monohydrate, (MACROBID) 100 MG capsule Take 1 capsule (100 mg total) by mouth every 12 (twelve) hours. 8 capsule 0   NOVOLOG FLEXPEN 100 UNIT/ML FlexPen INJECT SUBCUTANEOUSLY AS FOLLOWS WITH MEALS: 90-150=10u: 151-200=11u: 201-250=12u: 251-300=13u: 301-350=14u: 351-400=15u: BS>400=16u & CALL MD. (Patient taking differently: 10-16 Units 3 (three) times daily with meals. INJECT SUBCUTANEOUSLY AS FOLLOWS WITH MEALS: 90-150=10u: 151-200=11u: 201-250=12u: 251-300=13u: 301-350=14u: 351-400=15u: BS>400=16u & CALL MD.) 15 mL 0   ondansetron (ZOFRAN) 4 MG tablet Take 4 mg by mouth 2 (two) times daily.     oxybutynin (DITROPAN-XL) 5 MG 24 hr tablet Take 5 mg by mouth daily.     pantoprazole (PROTONIX) 40 MG tablet Take 1 tablet (40 mg total) by mouth 2 (two) times daily  before a meal. 180 tablet 1   polyethylene glycol (MIRALAX / GLYCOLAX) 17 g packet Take 17 g by mouth daily. 14 each 0   risperiDONE  (RISPERDAL) 1 MG tablet Take 1-2 mg by mouth 2 (two) times daily. Take 1 tablet by mouth in the morning. Take 2 tablets by mouth at bedtime.     SANTYL 250 UNIT/GM ointment Apply 1 Application topically daily.     senna (SENOKOT) 8.6 MG tablet Take 1 tablet by mouth daily.     Skin Protectants, Misc. (BAZA PROTECT EX) Apply 1 Application topically in the morning and at bedtime.     TRADJENTA 5 MG TABS tablet TAKE (1) TABLET BY MOUTH ONCE DAILY. (Patient taking differently: Take 5 mg by mouth daily.) 30 tablet 3   EASYMAX TEST test strip USE TO CHECK BLOOD SUGAR FOUR TIMES DAILY.(HOLD IF BS<70: CALL MD IF BS> 400) 100 strip 2   Lancets Thin MISC USE TO CHECK BLOOD SUGAR FOUR TIMES DAILY.(HOLD IF BS<70: CALL MD IF BS> 400) 100 each 2     Discharge Medications: Please see discharge summary for a list of discharge medications.  Relevant Imaging Results:  Relevant Lab Results:   Additional Information SSN: [id number] [place]

## 2021-09-22 NOTE — NC FL2 (Signed)
[place] LEVEL OF CARE SCREENING TOOL     IDENTIFICATION  Patient Name: [name] Birthdate: 12/02/1945 Sex: female Admission Date (Current Location): 09/21/2021  [facility] and [place] Number:  [employer] and Address:  [place] [address], [place]      Provider Number: [id number]  Attending Physician Name and Address:   Octaviano Glow, MD  Relative Name and Phone Number:  [name] (Legal Guardian) [phone]    Current Level of Care: Hospital Recommended Level of Care: [place] Prior Approval Number:    Date Approved/Denied:   PASRR Number:    Discharge Plan: SNF    Current Diagnoses: Patient Active Problem List   Diagnosis Date Noted   HOCM (hypertrophic obstructive cardiomyopathy) ([place]) 09/19/2021   Acute tracheobronchitis 09/17/2021   Pressure injury of skin 09/16/2021   Goals of care, counseling/discussion 09/16/2021   IV infiltration 19/50/9326   Acute metabolic encephalopathy 71/24/5809   UTI (urinary tract infection) 98/33/8250   Acute diastolic CHF (congestive heart failure) ([place]) 09/14/2021   Septic shock ([place]) 09/13/2021   Pneumonia 08/14/2021   PNA (pneumonia) 08/13/2021   Opioid dependence ([place]) 07/24/2021   Prolonged QT interval 07/22/2021   Mood disorder ([place]) 05/18/2021   Fecal impaction ([place]) 04/23/2021   Acute respiratory failure with hypoxia ([place]) 03/22/2021   Paroxysmal atrial fibrillation ([place]) 03/22/2021   Hyperthyroidism    SBO (small bowel obstruction) ([place]) 04/18/2020   Atrial fibrillation with RVR ([place]) 10/04/2019   GERD (gastroesophageal reflux disease) 08/26/2019   Constipation 09/09/2018   Hypotensive episode 03/21/2018   Primary osteoarthritis of right hip 04/18/2017   History of cerebrovascular accident (CVA) with residual deficit 03/28/2017   Schizophrenia ([place]) 03/28/2017   Generalized weakness 03/28/2017   IBS (irritable bowel syndrome)  10/16/2016   Left hemiparesis ([place]) 10/06/2016   Chronic superficial gastritis without bleeding    Stricture and stenosis of esophagus    IDA (iron deficiency anemia) 04/06/2015   Sedentary lifestyle 11/06/2014   Uncontrolled type 2 diabetes mellitus with hyperglycemia, with long-term current use of insulin ([place]) 03/24/2010   Essential hypertension 03/24/2010   DVT, lower extremity, recurrent ([place]) 03/24/2010    Orientation RESPIRATION BLADDER Height & Weight     Self, Time, Place  Normal Continent Weight: 126 lb (57.2 kg) Height:  '5\' 7"'$  (170.2 cm)  BEHAVIORAL SYMPTOMS/MOOD NEUROLOGICAL BOWEL NUTRITION STATUS      Continent Diet (Regular)  AMBULATORY STATUS COMMUNICATION OF NEEDS Skin   Extensive Assist Verbally Normal                       Personal Care Assistance Level of Assistance  Bathing, Feeding, Dressing Bathing Assistance: Maximum assistance Feeding assistance: Independent Dressing Assistance: Maximum assistance     Functional Limitations Info  Sight, Hearing, Speech Sight Info: Adequate Hearing Info: Adequate Speech Info: Adequate    SPECIAL CARE FACTORS FREQUENCY  PT (By licensed PT), OT (By licensed OT)     PT Frequency: 5 times weekly OT Frequency: 5 times weekly            Contractures Contractures Info: Not present    Additional Factors Info  Code Status, Allergies, Psychotropic Code Status Info: DNR Allergies Info: Sulfa Antibiotics Psychotropic Info: Abilify, Buspar, Coloazepam, Cymbalta, Risperdal         Current Medications (09/22/2021):  This is the current hospital active medication list Current Facility-Administered Medications  Medication Dose Route Frequency Provider Last Rate  Last Admin   acetaminophen (TYLENOL) tablet 650 mg  650 mg Oral TID PRN Godfrey Pick, MD   650 mg at 09/22/21 0934   albuterol (VENTOLIN HFA) 108 (90 Base) MCG/ACT inhaler 2 puff  2 puff Inhalation Q6H PRN Godfrey Pick, MD       amiodarone (PACERONE) tablet  200 mg  200 mg Oral Daily Godfrey Pick, MD   200 mg at 09/22/21 0934   apixaban (ELIQUIS) tablet 5 mg  5 mg Oral BID Godfrey Pick, MD   5 mg at 09/22/21 4098   atorvastatin (LIPITOR) tablet 40 mg  40 mg Oral QHS Godfrey Pick, MD   40 mg at 09/21/21 2200   busPIRone (BUSPAR) tablet 7.5 mg  7.5 mg Oral BID Godfrey Pick, MD   7.5 mg at 09/22/21 1191   calcium carbonate (TUMS - dosed in mg elemental calcium) chewable tablet 200 mg of elemental calcium  1 tablet Oral QID PRN Godfrey Pick, MD       clonazePAM Bobbye [place]) tablet 0.5 mg  0.5 mg Oral TID PRN Mesner, Corene Cornea, MD   0.5 mg at 09/22/21 0553   DULoxetine (CYMBALTA) DR capsule 60 mg  60 mg Oral Daily Godfrey Pick, MD   60 mg at 09/22/21 0934   HYDROcodone-acetaminophen (NORCO) 10-325 MG per tablet 1 tablet  1 tablet Oral Q8H PRN Godfrey Pick, MD   1 tablet at 09/22/21 0530   insulin aspart (novoLOG) injection 0-15 Units  0-15 Units Subcutaneous TID WC Godfrey Pick, MD   3 Units at 09/22/21 0935   insulin aspart (novoLOG) injection 0-5 Units  0-5 Units Subcutaneous QHS Godfrey Pick, MD       loratadine (CLARITIN) tablet 10 mg  10 mg Oral Daily Godfrey Pick, MD   10 mg at 09/22/21 0934   metoCLOPramide (REGLAN) tablet 5 mg  5 mg Oral Daily PRN Godfrey Pick, MD   5 mg at 09/22/21 4782   metoprolol succinate (TOPROL-XL) 24 hr tablet 125 mg  125 mg Oral Daily Godfrey Pick, MD   125 mg at 09/22/21 0933   midodrine (PROAMATINE) tablet 10 mg  10 mg Oral TID WC Godfrey Pick, MD   10 mg at 09/22/21 0935   mirtazapine (REMERON) tablet 30 mg  30 mg Oral QHS Mesner, Corene Cornea, MD       nitrofurantoin (macrocrystal-monohydrate) (MACROBID) capsule 100 mg  100 mg Oral Q12H Godfrey Pick, MD   100 mg at 09/22/21 0934   oxybutynin (DITROPAN-XL) 24 hr tablet 5 mg  5 mg Oral Daily Godfrey Pick, MD   5 mg at 09/22/21 0935   pantoprazole (PROTONIX) EC tablet 40 mg  40 mg Oral BID AC Godfrey Pick, MD   40 mg at 09/22/21 0934   polyethylene glycol (MIRALAX / GLYCOLAX) packet 17 g  17 g Oral  Daily Godfrey Pick, MD   17 g at 09/22/21 0936   risperiDONE (RISPERDAL) tablet 1 mg  1 mg Oral Daily Godfrey Pick, MD   1 mg at 09/22/21 0935   risperiDONE (RISPERDAL) tablet 2 mg  2 mg Oral QHS Godfrey Pick, MD   2 mg at 09/21/21 2200   senna (SENOKOT) tablet 8.6 mg  1 tablet Oral Daily Godfrey Pick, MD   8.6 mg at 09/22/21 9562   Current Outpatient Medications  Medication Sig Dispense Refill   ABILIFY 2 MG tablet Take 1 tablet (2 mg total) by mouth daily. (Patient taking differently: Take 2 mg by mouth at bedtime.) 30 tablet 5  acetaminophen (TYLENOL) 325 MG tablet Take 650 mg by mouth 3 (three) times daily as needed for mild pain or moderate pain.      albuterol (VENTOLIN HFA) 108 (90 Base) MCG/ACT inhaler Inhale 2 puffs into the lungs every 6 (six) hours as needed for wheezing or shortness of breath. 8 g 1   amiodarone (PACERONE) 200 MG tablet Take 1 tablet (200 mg total) by mouth daily.     apixaban (ELIQUIS) 5 MG TABS tablet Take 1 tablet (5 mg total) by mouth 2 (two) times daily. 60 tablet 0   atorvastatin (LIPITOR) 40 MG tablet Take 1 tablet by mouth at bedtime.     busPIRone (BUSPAR) 7.5 MG tablet Take 7.5 mg by mouth 2 (two) times daily.     calcium carbonate (TUMS - DOSED IN MG ELEMENTAL CALCIUM) 500 MG chewable tablet Chew 1 tablet by mouth 4 (four) times daily as needed for heartburn.     cholecalciferol (VITAMIN D3) 25 MCG (1000 UNIT) tablet Take 2,000 Units by mouth daily.     clonazePAM (KLONOPIN) 0.5 MG tablet Take 1 tablet (0.5 mg total) by mouth 3 (three) times daily as needed for anxiety. (Patient taking differently: Take 0.5 mg by mouth 3 (three) times daily.) 10 tablet 0   DULoxetine (CYMBALTA) 60 MG capsule Take 1 capsule by mouth daily.     GOODSENSE ARTIFICIAL TEARS 0.5-0.6 % SOLN Apply 1 drop to eye 3 (three) times daily.     HYDROcodone-acetaminophen (NORCO) 10-325 MG tablet Take 1 tablet by mouth every 8 (eight) hours as needed for severe pain. (Patient taking  differently: Take 1 tablet by mouth in the morning, at noon, and at bedtime.) 10 tablet 0   insulin detemir (LEVEMIR FLEXTOUCH) 100 UNIT/ML FlexPen INJECT 22 UNITS SUBCUTANEOUSLY AT BEDTIME.(HOLD IF BS<70: CALL MD IF BS> 400) (Patient taking differently: 22 Units at bedtime.) 15 mL 2   latanoprost (XALATAN) 0.005 % ophthalmic solution Place 1 drop into both eyes at bedtime.     loratadine (CLARITIN) 10 MG tablet Take 10 mg by mouth daily.     lubiprostone (AMITIZA) 8 MCG capsule Take 1 capsule by mouth daily.     metoCLOPramide (REGLAN) 5 MG tablet Take 5 mg by mouth daily as needed for nausea or vomiting.     metoprolol succinate (TOPROL-XL) 25 MG 24 hr tablet Take 5 tablets (125 mg total) by mouth daily. 150 tablet 1   midodrine (PROAMATINE) 10 MG tablet Take 10 mg by mouth 3 (three) times daily.     mirtazapine (REMERON) 30 MG tablet Take 30 mg by mouth at bedtime.     naloxegol oxalate (MOVANTIK) 12.5 MG TABS tablet Take 1 tablet (12.5 mg total) by mouth daily. 30 tablet 1   nitrofurantoin, macrocrystal-monohydrate, (MACROBID) 100 MG capsule Take 1 capsule (100 mg total) by mouth every 12 (twelve) hours. 8 capsule 0   NOVOLOG FLEXPEN 100 UNIT/ML FlexPen INJECT SUBCUTANEOUSLY AS FOLLOWS WITH MEALS: 90-150=10u: 151-200=11u: 201-250=12u: 251-300=13u: 301-350=14u: 351-400=15u: BS>400=16u & CALL MD. (Patient taking differently: 10-16 Units 3 (three) times daily with meals. INJECT SUBCUTANEOUSLY AS FOLLOWS WITH MEALS: 90-150=10u: 151-200=11u: 201-250=12u: 251-300=13u: 301-350=14u: 351-400=15u: BS>400=16u & CALL MD.) 15 mL 0   ondansetron (ZOFRAN) 4 MG tablet Take 4 mg by mouth 2 (two) times daily.     oxybutynin (DITROPAN-XL) 5 MG 24 hr tablet Take 5 mg by mouth daily.     pantoprazole (PROTONIX) 40 MG tablet Take 1 tablet (40 mg total) by mouth 2 (two) times daily  before a meal. 180 tablet 1   polyethylene glycol (MIRALAX / GLYCOLAX) 17 g packet Take 17 g by mouth daily. 14 each 0   risperiDONE  (RISPERDAL) 1 MG tablet Take 1-2 mg by mouth 2 (two) times daily. Take 1 tablet by mouth in the morning. Take 2 tablets by mouth at bedtime.     SANTYL 250 UNIT/GM ointment Apply 1 Application topically daily.     senna (SENOKOT) 8.6 MG tablet Take 1 tablet by mouth daily.     Skin Protectants, Misc. (BAZA PROTECT EX) Apply 1 Application topically in the morning and at bedtime.     TRADJENTA 5 MG TABS tablet TAKE (1) TABLET BY MOUTH ONCE DAILY. (Patient taking differently: Take 5 mg by mouth daily.) 30 tablet 3   EASYMAX TEST test strip USE TO CHECK BLOOD SUGAR FOUR TIMES DAILY.(HOLD IF BS<70: CALL MD IF BS> 400) 100 strip 2   Lancets Thin MISC USE TO CHECK BLOOD SUGAR FOUR TIMES DAILY.(HOLD IF BS<70: CALL MD IF BS> 400) 100 each 2     Discharge Medications: Please see discharge summary for a list of discharge medications.  Relevant Imaging Results:  Relevant Lab Results:   Additional Information SSN: [id number], [place]

## 2021-09-22 NOTE — ED Notes (Addendum)
CSW spoke with [place], they are accepting current bed offer from [facility] and will update the office and pts active LG with their team. Herman spoke to [place] with [facility] who will look over things and provide CSW with room and report numbers shortly. TOC to follow.   CSW updated RN and MD that facility is ready to accept pt today. CSW provided RN with numbers for report and room. LG updated that pt will D/C to [facility] today. ED can call for transport via EMS when ready. TOC signing off.

## 2021-09-22 NOTE — Discharge Instructions (Signed)
Further narcotic refills should be provided by PCP or facility provider.

## 2021-09-22 NOTE — Plan of Care (Signed)
  Problem: Acute Rehab PT Goals(only PT should resolve) Goal: Pt Will Go Supine/Side To Sit Outcome: Progressing Flowsheets (Taken 09/22/2021 1141) Pt will go Supine/Side to Sit:  with minimal assist  with min guard assist Goal: Patient Will Transfer Sit To/From Stand Outcome: Progressing Flowsheets (Taken 09/22/2021 1141) Patient will transfer sit to/from stand: with minimal assist Goal: Pt Will Transfer Bed To Chair/Chair To Bed Outcome: Progressing Flowsheets (Taken 09/22/2021 1141) Pt will Transfer Bed to Chair/Chair to Bed:  with min assist  with mod assist Goal: Pt Will Ambulate Outcome: Progressing Flowsheets (Taken 09/22/2021 1141) Pt will Ambulate:  15 feet  with moderate assist  with minimal assist  with rolling walker   11:42 AM, 09/22/21 Lonell Grandchild, MPT Physical Therapist with [facility] [phone] office [phone] mobile phone

## 2021-09-22 NOTE — ED Provider Notes (Signed)
Patient discharged to SNF in stable condition, vitals wnl, well appearing on exam, and personally assessed prior to transfer.   Wyvonnia Dusky, MD 09/22/21 [phone]

## 2021-09-22 NOTE — ED Notes (Signed)
Nurse made aware of patients CBG level.

## 2021-09-22 NOTE — ED Notes (Signed)
Pt assisted back to bed and placed on right side

## 2021-09-22 NOTE — ED Notes (Signed)
Pt repositioned in bed, bedding and sheets changed. Pillow placed under left side.

## 2021-09-22 NOTE — ED Notes (Signed)
Pt repositioned and turned onto right side.

## 2021-09-22 NOTE — ED Notes (Signed)
Report given to [facility] with [facility]

## 2021-09-22 NOTE — ED Notes (Signed)
Pt repeatedly pressing call light every other minute requesting pain medication, medication for nausea, and stating she feels hot. Writer has informed patient that we can get her more Norco at 0600 but that she can have tylenol now if she'd like (patient refused). Writer offered to reposition patient to get more comfortable (patient refused). Writer offered patient nausea medication (patient requires applesauce or similar - called AC to bring some down to ED).

## 2021-09-22 NOTE — Evaluation (Signed)
Physical Therapy Evaluation Patient Details Name: [name] MRN: [id number] DOB: 1945/06/25 Today's Date: 09/22/2021  History of Present Illness  [name] is a 76 y.o. female.        Weakness  Patient presents for lethargy.  Medical history includes T2DM, HTN, DVT, anemia, schizophrenia, CVA, GERD, atrial fibrillation, arthritis, anxiety, depression.  She had a recent hospitalization, during which she was treated for urosepsis.  During this hospitalization, she was admitted to ICU and intubated.  She was discharged 2 days ago to [facility] [place] [facility].  Facility states that she has been lethargic since her arrival.  She is reportedly conversant at baseline.  She is diabetic and did have an episode of hypoglycemia last night.  This seems to have resolved today.  Patient endorses chronic pain in her back.  She denies any areas of knee pain.  She denies any current nausea.   Clinical Impression  Patient demonstrates slow labored movement for sitting up at bedside with most difficulty scooting to EOB due to weakness, very unsteady on feet with frequent buckling of knees and limited to a few side steps before having to sit due to loss of balance.  Patient tolerated sitting up in chair after therapy - nursing staff aware.  Patient will benefit from continued skilled physical therapy in hospital and recommended venue below to increase strength, balance, endurance for safe ADLs and gait.         Recommendations for follow up therapy are one component of a multi-disciplinary discharge planning process, led by the attending physician.  Recommendations may be updated based on patient status, additional functional criteria and insurance authorization.  Follow Up Recommendations Skilled nursing-short term rehab (<3 hours/day) Can patient physically be transported by private vehicle: Yes    Assistance Recommended at Discharge Intermittent Supervision/Assistance  Patient can return home  with the following  A lot of help with walking and/or transfers;A little help with bathing/dressing/bathroom;Assistance with cooking/housework;Assist for transportation;Help with stairs or ramp for entrance    Equipment Recommendations None recommended by PT  Recommendations for Other Services       Functional Status Assessment Patient has had a recent decline in their functional status and demonstrates the ability to make significant improvements in function in a reasonable and predictable amount of time.     Precautions / Restrictions Precautions Precautions: Fall Restrictions Weight Bearing Restrictions: No      Mobility  Bed Mobility Overal bed mobility: Needs Assistance Bed Mobility: Supine to Sit     Supine to sit: Min assist, Mod assist     General bed mobility comments: slow labored movement with difficulty scooting to EOB    Transfers Overall transfer level: Needs assistance Equipment used: Rolling walker (2 wheels) Transfers: Sit to/from Stand, Bed to chair/wheelchair/BSC Sit to Stand: Mod assist   Step pivot transfers: Mod assist       General transfer comment: increased time, labored movement    Ambulation/Gait Ambulation/Gait assistance: Mod assist Gait Distance (Feet): 4 Feet Assistive device: Rolling walker (2 wheels) Gait Pattern/deviations: Decreased step length - left, Decreased stride length, Decreased step length - right, Knees buckling Gait velocity: slow     General Gait Details: limited to a few slow labored side steps with frequent buckling of knees due to weakness  Stairs            Wheelchair Mobility    Modified Rankin (Stroke Patients Only)       Balance Overall balance assessment: Needs  assistance Sitting-balance support: Feet supported, No upper extremity supported Sitting balance-Leahy Scale: Fair Sitting balance - Comments: seated at EOB   Standing balance support: During functional activity, Bilateral upper  extremity supported, Reliant on assistive device for balance Standing balance-Leahy Scale: Poor Standing balance comment: using RW                             Pertinent Vitals/Pain Pain Assessment Pain Assessment: Faces Faces Pain Scale: Hurts a little bit Pain Location: generalized pain all over, mostly back Pain Descriptors / Indicators: Discomfort, Sore Pain Intervention(s): Limited activity within patient's tolerance, Monitored during session, Repositioned    Home Living Family/patient expects to be discharged to:: Assisted living                 Home Equipment: Conservation officer, nature (2 wheels);Wheelchair - manual;Hospital bed      Prior Function Prior Level of Function : Needs assist       Physical Assist : Mobility (physical);ADLs (physical) Mobility (physical): Bed mobility;Transfers;Gait;Stairs   Mobility Comments: Assisted for transfers and a few steps using RW during transfers ADLs Comments: asissted by ALF staff. Pt reports independence with toileting, grooming, and eating.     Hand Dominance   Dominant Hand: Right    Extremity/Trunk Assessment   Upper Extremity Assessment Upper Extremity Assessment: Generalized weakness    Lower Extremity Assessment Lower Extremity Assessment: Generalized weakness    Cervical / Trunk Assessment Cervical / Trunk Assessment: Normal  Communication   Communication: No difficulties  Cognition Arousal/Alertness: Awake/alert Behavior During Therapy: WFL for tasks assessed/performed, Anxious Overall Cognitive Status: Within Functional Limits for tasks assessed                                          General Comments      Exercises     Assessment/Plan    PT Assessment Patient needs continued PT services  PT Problem List Decreased strength;Decreased activity tolerance;Decreased balance;Decreased mobility       PT Treatment Interventions DME instruction;Gait training;Stair  training;Functional mobility training;Therapeutic activities;Therapeutic exercise;Balance training;Patient/family education    PT Goals (Current goals can be found in the Care Plan section)  Acute Rehab PT Goals Patient Stated Goal: return home after rehab PT Goal Formulation: With patient Time For Goal Achievement: 10/06/21 Potential to Achieve Goals: Good    Frequency Min 2X/week     Co-evaluation               AM-PAC PT "6 Clicks" Mobility  Outcome Measure Help needed turning from your back to your side while in a flat bed without using bedrails?: A Little Help needed moving from lying on your back to sitting on the side of a flat bed without using bedrails?: A Lot Help needed moving to and from a bed to a chair (including a wheelchair)?: A Lot Help needed standing up from a chair using your arms (e.g., wheelchair or bedside chair)?: A Lot Help needed to walk in hospital room?: A Lot Help needed climbing 3-5 steps with a railing? : Total 6 Click Score: 12    End of Session   Activity Tolerance: Patient tolerated treatment well;Patient limited by fatigue Patient left: in chair;with call bell/phone within reach Nurse Communication: Mobility status PT Visit Diagnosis: Unsteadiness on feet (R26.81);Other abnormalities of gait and mobility (R26.89);Muscle weakness (generalized) (M62.81)  Time: 0076-2263 PT Time Calculation (min) (ACUTE ONLY): 20 min   Charges:   PT Evaluation $PT Eval Moderate Complexity: 1 Mod PT Treatments $Therapeutic Activity: 8-22 mins        11:40 AM, 09/22/21 Lonell Grandchild, MPT Physical Therapist with [facility] 336 [phone] office [phone] mobile phone

## 2021-09-22 NOTE — ED Notes (Signed)
Pt sitting up in bed eating meal tray; requesting pain medication and informed she can have it at 1330; pt verbalized understanding

## 2021-09-22 NOTE — ED Notes (Signed)
Writer rounded on patient. Pt given ice water per request. Writer asked if patient would be okay with writer turning her onto her right side more to get off of the sore on her left side. Pt refused.

## 2021-09-27 LAB — CULTURE, BLOOD (ROUTINE X 2)
Culture: NO GROWTH
Culture: NO GROWTH
Special Requests: ADEQUATE

## 2021-10-09 NOTE — Progress Notes (Deleted)
GI Office Note    Referring Provider: Carrolyn Meiers* Primary Care Physician:  Carrolyn Meiers, MD  Primary Gastroenterologist: Elon Alas. Abbey Chatters, DO   Chief Complaint   No chief complaint on file.   History of Present Illness   [name] is a 76 y.o. female presenting today  for hospital follow up. She was seen by GI while hospitalized back in 07/2021. History of GERD, chronic constipation, N/V, abdominal pain. CT during admission with debris in distal esophagus. EGD 07/23/21 with food in middle and lower third of esophagus s/p removal but could not dilated as patient changed from afib to aflutter and became hypotensive.   Hospitalized again last month, this time with septic shock secondary to UTI        Medications   Current Outpatient Medications  Medication Sig Dispense Refill   ABILIFY 2 MG tablet Take 1 tablet (2 mg total) by mouth daily. (Patient taking differently: Take 2 mg by mouth at bedtime.) 30 tablet 5   acetaminophen (TYLENOL) 325 MG tablet Take 650 mg by mouth 3 (three) times daily as needed for mild pain or moderate pain.      albuterol (VENTOLIN HFA) 108 (90 Base) MCG/ACT inhaler Inhale 2 puffs into the lungs every 6 (six) hours as needed for wheezing or shortness of breath. 8 g 1   amiodarone (PACERONE) 200 MG tablet Take 1 tablet (200 mg total) by mouth daily.     apixaban (ELIQUIS) 5 MG TABS tablet Take 1 tablet (5 mg total) by mouth 2 (two) times daily. 60 tablet 0   atorvastatin (LIPITOR) 40 MG tablet Take 1 tablet by mouth at bedtime.     busPIRone (BUSPAR) 7.5 MG tablet Take 7.5 mg by mouth 2 (two) times daily.     calcium carbonate (TUMS - DOSED IN MG ELEMENTAL CALCIUM) 500 MG chewable tablet Chew 1 tablet by mouth 4 (four) times daily as needed for heartburn.     cholecalciferol (VITAMIN D3) 25 MCG (1000 UNIT) tablet Take 2,000 Units by mouth daily.     clonazePAM (KLONOPIN) 0.5 MG tablet Take 1 tablet (0.5 mg total) by mouth  3 (three) times daily as needed for anxiety. (Patient taking differently: Take 0.5 mg by mouth 3 (three) times daily.) 10 tablet 0   clonazePAM (KLONOPIN) 0.5 MG tablet Take 1 tablet (0.5 mg total) by mouth 3 (three) times daily as needed for up to 5 days for anxiety. 15 tablet 0   DULoxetine (CYMBALTA) 60 MG capsule Take 1 capsule by mouth daily.     EASYMAX TEST test strip USE TO CHECK BLOOD SUGAR FOUR TIMES DAILY.(HOLD IF BS<70: CALL MD IF BS> 400) 100 strip 2   GOODSENSE ARTIFICIAL TEARS 0.5-0.6 % SOLN Apply 1 drop to eye 3 (three) times daily.     HYDROcodone-acetaminophen (NORCO) 10-325 MG tablet Take 1 tablet by mouth every 8 (eight) hours as needed for severe pain. (Patient taking differently: Take 1 tablet by mouth in the morning, at noon, and at bedtime.) 10 tablet 0   insulin detemir (LEVEMIR FLEXTOUCH) 100 UNIT/ML FlexPen INJECT 22 UNITS SUBCUTANEOUSLY AT BEDTIME.(HOLD IF BS<70: CALL MD IF BS> 400) (Patient taking differently: 22 Units at bedtime.) 15 mL 2   Lancets Thin MISC USE TO CHECK BLOOD SUGAR FOUR TIMES DAILY.(HOLD IF BS<70: CALL MD IF BS> 400) 100 each 2   latanoprost (XALATAN) 0.005 % ophthalmic solution Place 1 drop into both eyes at bedtime.  loratadine (CLARITIN) 10 MG tablet Take 10 mg by mouth daily.     lubiprostone (AMITIZA) 8 MCG capsule Take 1 capsule by mouth daily.     metoCLOPramide (REGLAN) 5 MG tablet Take 5 mg by mouth daily as needed for nausea or vomiting.     metoprolol succinate (TOPROL-XL) 25 MG 24 hr tablet Take 5 tablets (125 mg total) by mouth daily. 150 tablet 1   midodrine (PROAMATINE) 10 MG tablet Take 10 mg by mouth 3 (three) times daily.     mirtazapine (REMERON) 30 MG tablet Take 30 mg by mouth at bedtime.     naloxegol oxalate (MOVANTIK) 12.5 MG TABS tablet Take 1 tablet (12.5 mg total) by mouth daily. 30 tablet 1   nitrofurantoin, macrocrystal-monohydrate, (MACROBID) 100 MG capsule Take 1 capsule (100 mg total) by mouth every 12 (twelve) hours.  8 capsule 0   NOVOLOG FLEXPEN 100 UNIT/ML FlexPen INJECT SUBCUTANEOUSLY AS FOLLOWS WITH MEALS: 90-150=10u: 151-200=11u: 201-250=12u: 251-300=13u: 301-350=14u: 351-400=15u: BS>400=16u & CALL MD. (Patient taking differently: 10-16 Units 3 (three) times daily with meals. INJECT SUBCUTANEOUSLY AS FOLLOWS WITH MEALS: 90-150=10u: 151-200=11u: 201-250=12u: 251-300=13u: 301-350=14u: 351-400=15u: BS>400=16u & CALL MD.) 15 mL 0   ondansetron (ZOFRAN) 4 MG tablet Take 4 mg by mouth 2 (two) times daily.     oxybutynin (DITROPAN-XL) 5 MG 24 hr tablet Take 5 mg by mouth daily.     pantoprazole (PROTONIX) 40 MG tablet Take 1 tablet (40 mg total) by mouth 2 (two) times daily before a meal. 180 tablet 1   polyethylene glycol (MIRALAX / GLYCOLAX) 17 g packet Take 17 g by mouth daily. 14 each 0   risperiDONE (RISPERDAL) 1 MG tablet Take 1-2 mg by mouth 2 (two) times daily. Take 1 tablet by mouth in the morning. Take 2 tablets by mouth at bedtime.     SANTYL 250 UNIT/GM ointment Apply 1 Application topically daily.     senna (SENOKOT) 8.6 MG tablet Take 1 tablet by mouth daily.     Skin Protectants, Misc. (BAZA PROTECT EX) Apply 1 Application topically in the morning and at bedtime.     TRADJENTA 5 MG TABS tablet TAKE (1) TABLET BY MOUTH ONCE DAILY. (Patient taking differently: Take 5 mg by mouth daily.) 30 tablet 3   No current facility-administered medications for this visit.    Allergies   Allergies as of 10/10/2021 - Review Complete 09/21/2021  Allergen Reaction Noted   Sulfa antibiotics Rash 02/23/2010     Past Medical History   Past Medical History:  Diagnosis Date   Anxiety    Arthritis    Atrial fibrillation (HCC)    Atrial flutter (HCC)    Collagen vascular disease ([place])    COVID-19 virus infection    COVID-19+ approx 01/24/19; asymptomatic course with full recovery   Dependence on wheelchair    pivot/transfers   Depression    History of psychosis and previous suicide attempt   DVT, lower  extremity, recurrent ([place])    Long-term Coumadin per Dr. Legrand Rams   Essential hypertension    GERD (gastroesophageal reflux disease)    Hemiplegia ([place]) 2010   Left side   History of stroke    Acute infarct and right cerebral white matter small vessel disease 12/10   Leg DVT (deep venous thromboembolism), acute ([place]) 2006   Orthostatic hypotension    Schizophrenia ([place])    Stroke ([place])    left sided weakness   Type 2 diabetes mellitus ([place])     Past  Surgical History   Past Surgical History:  Procedure Laterality Date   BACK SURGERY     BIOPSY N/A 11/24/2014   Procedure: BIOPSY;  Surgeon: Danie Binder, MD;  Location: [facility];  Service: Endoscopy;  Laterality: N/A;   BIOPSY  05/17/2016   Procedure: BIOPSY;  Surgeon: Danie Binder, MD;  Location: [facility];  Service: Endoscopy;;  gastric biopsy   COLONOSCOPY WITH PROPOFOL N/A 11/24/2014   Dr. Rudie Meyer polyps removed/moderate sized internal hemorrhoids, tubular adenomas. Next surveillance in 3 years   ESOPHAGEAL DILATION N/A 07/23/2021   Procedure: ESOPHAGEAL DILATION;  Surgeon: Eloise Harman, DO;  Location: [facility];  Service: Endoscopy;  Laterality: N/A;   ESOPHAGOGASTRODUODENOSCOPY (EGD) WITH PROPOFOL N/A 11/24/2014   Dr. Clayburn Pert HH/patent stricture at the gastroesophageal junction, mild non-erosive gastritis, path negative for H.pylori or celiac sprue   ESOPHAGOGASTRODUODENOSCOPY (EGD) WITH PROPOFOL N/A 05/17/2016   Procedure: ESOPHAGOGASTRODUODENOSCOPY (EGD) WITH PROPOFOL;  Surgeon: Danie Binder, MD;  [facility];  Service: Endoscopy;  Laterality: N/A;  12:45pm   ESOPHAGOGASTRODUODENOSCOPY (EGD) WITH PROPOFOL N/A 07/23/2021   Procedure: ESOPHAGOGASTRODUODENOSCOPY (EGD) WITH PROPOFOL;  Surgeon: Eloise Harman, DO;  [facility];  Service: Endoscopy;  Laterality: N/A;   GIVENS CAPSULE STUDY N/A 12/11/2014   MULTILPLE EROSION IN the stomach WITH ACTIVE OOZING. OCCASIONAL EROSIONS AND RARE ULCER  SEEN IN PROXIMAL SMALL BOWEL . No masses or AVMs SEEN. NO OLD BLOOD OR FRESH BLOOD SEEN.    POLYPECTOMY N/A 11/24/2014   Procedure: POLYPECTOMY;  Surgeon: Danie Binder, MD;  Location: [facility];  Service: Endoscopy;  Laterality: N/A;   SAVORY DILATION N/A 05/17/2016   Procedure: SAVORY DILATION;  Surgeon: Danie Binder, MD;  [facility];  Service: Endoscopy;  Laterality: N/A;    Past Family History   Family History  Problem Relation Age of Onset   Hypertension Mother    Colon cancer Neg Hx     Past Social History   Social History   Socioeconomic History   Marital status: Widowed    Spouse name: Not on file   Number of children: Not on file   Years of education: Not on file   Highest education level: Not on file  Occupational History   Not on file  Tobacco Use   Smoking status: Former    Packs/day: 0.25    Years: 20.00    Total pack years: 5.00    Types: Cigarettes    Quit date: 01/24/1995    Years since quitting: 26.7   Smokeless tobacco: Never  Vaping Use   Vaping Use: Never used  Substance and Sexual Activity   Alcohol use: No    Alcohol/week: 0.0 standard drinks of alcohol   Drug use: No   Sexual activity: Never    Birth control/protection: Post-menopausal  Other Topics Concern   Not on file  Social History Narrative   Not on file   Social Determinants of Health   Financial Resource Strain: Not on file  Food Insecurity: Not on file  Transportation Needs: Not on file  Physical Activity: Not on file  Stress: Not on file  Social Connections: Not on file  Intimate Partner Violence: Not on file    Review of Systems   General: Negative for anorexia, weight loss, fever, chills, fatigue, weakness. ENT: Negative for hoarseness, difficulty swallowing , nasal congestion. CV: Negative for chest pain, angina, palpitations, dyspnea on exertion, peripheral edema.  Respiratory: Negative for dyspnea at  rest, dyspnea on exertion, cough, sputum, wheezing.   GI: See history of present illness. GU:  Negative for dysuria, hematuria, urinary incontinence, urinary frequency, nocturnal urination.  Endo: Negative for unusual weight change.     Physical Exam   There were no vitals taken for this visit.   General: Well-nourished, well-developed in no acute distress.  Eyes: No icterus. Mouth: Oropharyngeal mucosa moist and pink , no lesions erythema or exudate. Lungs: Clear to auscultation bilaterally.  Heart: Regular rate and rhythm, no murmurs rubs or gallops.  Abdomen: Bowel sounds are normal, nontender, nondistended, no hepatosplenomegaly or masses,  no abdominal bruits or hernia , no rebound or guarding.  Rectal: ***  Extremities: No lower extremity edema. No clubbing or deformities. Neuro: Alert and oriented x 4   Skin: Warm and dry, no jaundice.   Psych: Alert and cooperative, normal mood and affect.  Labs   *** Imaging Studies   MR BRAIN WO CONTRAST  Result Date: 09/21/2021 CLINICAL DATA:  Mental status change, unknown cause EXAM: MRI HEAD WITHOUT CONTRAST TECHNIQUE: Multiplanar, multiecho pulse sequences of the brain and surrounding structures were obtained without intravenous contrast. COMPARISON:  2018 FINDINGS: Brain: There is no acute infarction or intracranial hemorrhage. There is no intracranial mass, mass effect, or edema. There is no hydrocephalus or extra-axial fluid collection. Prominence of the ventricles and sulci reflects similar parenchymal volume loss. Foci of susceptibility the left corona radiata and thalamus likely reflect chronic microhemorrhages. Patchy and confluent areas of T2 hyperintensity in the supratentorial white matter are nonspecific probably reflect similar moderate chronic microvascular ischemic changes there are chronic small vessel infarcts the central white matter bilaterally. Vascular: Major vessel flow voids at the skull base are preserved. Skull and upper cervical spine: Normal marrow signal is  preserved. Sinuses/Orbits: Evidence of prior sinonasal surgery. Orbits are unremarkable. Other: Sella is unremarkable.  Trace mastoid fluid opacification. IMPRESSION: No acute infarction, hemorrhage, or mass. Similar chronic microvascular ischemic changes, small infarcts, and parenchymal volume loss. Electronically Signed   By: Macy Mis M.D.   On: 09/21/2021 13:31   CT Head Wo Contrast  Result Date: 09/21/2021 CLINICAL DATA:  Altered mental status, weakness. EXAM: CT HEAD WITHOUT CONTRAST TECHNIQUE: Contiguous axial images were obtained from the base of the skull through the vertex without intravenous contrast. RADIATION DOSE REDUCTION: This exam was performed according to the departmental dose-optimization program which includes automated exposure control, adjustment of the mA and/or kV according to patient size and/or use of iterative reconstruction technique. COMPARISON:  September 13, 2021. FINDINGS: Brain: Mild chronic ischemic white matter disease is noted. No mass effect or midline shift is noted. Ventricular size is within normal limits. There is no evidence of mass lesion, hemorrhage or acute infarction. Vascular: No hyperdense vessel or unexpected calcification. Skull: Normal. Negative for fracture or focal lesion. Sinuses/Orbits: No acute finding. Other: None. IMPRESSION: No acute intracranial abnormality seen. Electronically Signed   By: Marijo Conception M.D.   On: 09/21/2021 10:42   DG Chest Port 1 View  Result Date: 09/21/2021 CLINICAL DATA:  Questionable sepsis - evaluate for abnormality EXAM: PORTABLE CHEST 1 VIEW COMPARISON:  09/13/2021 FINDINGS: Heart borderline enlarged. Aortic atherosclerosis. No confluent airspace opacities or effusions. No acute bony abnormality. IMPRESSION: No active disease. Electronically Signed   By: Rolm Baptise M.D.   On: 09/21/2021 09:19   [place] EKG SITE RITE  Result Date: 09/15/2021 If Site Rite image not attached, placement could not be confirmed due to  current cardiac  rhythm.  ECHOCARDIOGRAM COMPLETE  Result Date: 09/14/2021    ECHOCARDIOGRAM REPORT   Patient Name:   [name] Date of Exam: 09/14/2021 Medical Rec #:  [id number]       Height:       67.0 in Accession #:    [id number]      Weight:       146.2 lb Date of Birth:  12/24/1945       BSA:          1.770 m Patient Age:    27 years        BP:           129/67 mmHg Patient Gender: F               HR:           84 bpm. Exam Location:  [facility] Procedure: 2D Echo, Cardiac Doppler and Color Doppler Indications:    CHF  History:        Patient has prior history of Echocardiogram examinations, most                 recent 10/05/2019. CHF, Stroke, Arrythmias:Atrial Fibrillation,                 Signs/Symptoms:Altered Mental Status; Risk Factors:Hypertension,                 Diabetes and Former Smoker.  Sonographer:    Wenda Low Referring Phys: [phone] DAVID TAT  Sonographer Comments: Echo performed with patient supine and on artificial respirator. IMPRESSIONS  1. Severe asymmetric hypertrophy of the basal septum up to 57m. There is evidence of SAM of the MV leaflets with obstruction up to 37 mmHG (variation noted due to afib). 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 Would consider a cardiac MRI for better characterization. This was not well characterized on the last echocardiogram. Left ventricular ejection fraction, by estimation, is 65 to 70%. The left ventricle has normal function. The left ventricle has no regional wall motion abnormalities. There is severe asymmetric left ventricular hypertrophy of the basal-septal segment. Left ventricular diastolic function could not be evaluated.  2. Right ventricular systolic function is normal. The right ventricular size is normal.  3. Left atrial size was mildly dilated.  4. Right atrial size was mildly dilated.  5. The mitral valve is degenerative. Trivial mitral valve regurgitation. No evidence of mitral stenosis.  6. The  aortic valve is tricuspid. There is mild calcification of the aortic valve. There is mild thickening of the aortic valve. Aortic valve regurgitation is not visualized. Aortic valve sclerosis/calcification is present, without any evidence of aortic stenosis. FINDINGS  Left Ventricle: Severe asymmetric hypertrophy of the basal septum up to 134m There is evidence of SAM of the MV leaflets with obstruction up to 37 mmHG (variation noted due to afib). 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  Would consider a cardiac MRI for better characterization. This was not well characterized on the last echocardiogram. Left ventricular ejection fraction, by estimation, is 65 to 70%. The left ventricle has normal function. The left ventricle has no regional wall motion abnormalities. The left ventricular internal cavity size was normal in size. There is severe asymmetric left ventricular hypertrophy of the basal-septal segment. Left ventricular diastolic function could not be evaluated due to atrial fibrillation. Left ventricular diastolic function could not be evaluated. Right Ventricle: The right ventricular size is normal. No increase in right ventricular wall thickness. Right ventricular systolic function  is normal. Left Atrium: Left atrial size was mildly dilated. Right Atrium: Right atrial size was mildly dilated. Pericardium: Trivial pericardial effusion is present. Presence of epicardial fat layer. Mitral Valve: The mitral valve is degenerative in appearance. Trivial mitral valve regurgitation. No evidence of mitral valve stenosis. MV peak gradient, 7.3 mmHg. The mean mitral valve gradient is 2.0 mmHg. Tricuspid Valve: The tricuspid valve is grossly normal. Tricuspid valve regurgitation is mild . No evidence of tricuspid stenosis. Aortic Valve: The aortic valve is tricuspid. There is mild calcification of the aortic valve. There is mild thickening of the aortic valve. Aortic valve regurgitation  is not visualized. Aortic valve sclerosis/calcification is present, without any evidence of aortic stenosis. Aortic valve mean gradient measures 7.5 mmHg. Aortic valve peak gradient measures 16.2 mmHg. Aortic valve area, by VTI measures 2.37 cm. Pulmonic Valve: The pulmonic valve was grossly normal. Pulmonic valve regurgitation is not visualized. No evidence of pulmonic stenosis. Aorta: The aortic root and ascending aorta are structurally normal, with no evidence of dilitation. Venous: IVC assessment for right atrial pressure unable to be performed due to mechanical ventilation. IAS/Shunts: The atrial septum is grossly normal.  LEFT VENTRICLE PLAX 2D LVIDd:         3.80 cm LVIDs:         1.80 cm LV PW:         1.30 cm LV IVS:        1.70 cm LVOT diam:     2.00 cm LV SV:         84 LV SV Index:   48 LVOT Area:     3.14 cm  RIGHT VENTRICLE RV Basal diam:  3.70 cm RV Mid diam:    3.10 cm RV S prime:     12.80 cm/s LEFT ATRIUM             Index        RIGHT ATRIUM           Index LA diam:        4.50 cm 2.54 cm/m   RA Area:     18.10 cm LA Vol (A2C):   62.5 ml 35.31 ml/m  RA Volume:   53.70 ml  30.34 ml/m LA Vol (A4C):   62.0 ml 35.03 ml/m LA Biplane Vol: 62.6 ml 35.37 ml/m  AORTIC VALVE                     PULMONIC VALVE AV Area (Vmax):    2.31 cm      PV Vmax:       0.87 m/s AV Area (Vmean):   2.40 cm      PV Peak grad:  3.0 mmHg AV Area (VTI):     2.37 cm AV Vmax:           201.50 cm/s AV Vmean:          123.200 cm/s AV VTI:            0.355 m AV Peak Grad:      16.2 mmHg AV Mean Grad:      7.5 mmHg LVOT Vmax:         148.00 cm/s LVOT Vmean:        94.200 cm/s LVOT VTI:          0.268 m LVOT/AV VTI ratio: 0.75  AORTA Ao Root diam: 3.70 cm Ao Asc diam:  3.40 cm MITRAL VALVE  TRICUSPID VALVE MV Area (PHT): 3.95 cm     TR Peak grad:   35.0 mmHg MV Area VTI:   3.43 cm     TR Vmax:        296.00 cm/s MV Peak grad:  7.3 mmHg MV Mean grad:  2.0 mmHg     SHUNTS MV Vmax:       1.35 m/s     Systemic  VTI:  0.27 m MV Vmean:      64.5 cm/s    Systemic Diam: 2.00 cm MV Decel Time: 192 msec MV E velocity: 118.00 cm/s Eleonore Chiquito MD Electronically signed by Eleonore Chiquito MD Signature Date/Time: 09/14/2021/3:29:21 PM    Final    CT Head Wo Contrast  Result Date: 09/13/2021 CLINICAL DATA:  Headache, new or worsening. EXAM: CT HEAD WITHOUT CONTRAST TECHNIQUE: Contiguous axial images were obtained from the base of the skull through the vertex without intravenous contrast. RADIATION DOSE REDUCTION: This exam was performed according to the departmental dose-optimization program which includes automated exposure control, adjustment of the mA and/or kV according to patient size and/or use of iterative reconstruction technique. COMPARISON:  CT examination dated August 04, 2021 FINDINGS: Brain: No evidence of acute infarction, hemorrhage, hydrocephalus, extra-axial collection or mass lesion/mass effect. Low-attenuation of the periventricular and subcortical white matter presumed advanced chronic microvascular ischemic changes. Vascular: No hyperdense vessel or unexpected calcification. Skull: Normal. Negative for fracture or focal lesion. Sinuses/Orbits: Paranasal sinus postsurgical changes. Other: None. IMPRESSION: 1. No acute intracranial abnormality. 2. Advanced chronic microvascular ischemic changes of the supratentorial white matter. Electronically Signed   By: Keane Police D.O.   On: 09/13/2021 23:38   DG Chest Port 1 View  Result Date: 09/13/2021 CLINICAL DATA:  Hypoxia EXAM: PORTABLE CHEST 1 VIEW COMPARISON:  08/29/2021 FINDINGS: Cardiomegaly. Interstitial prominence throughout the lungs, right greater than left. This could reflect interstitial edema or atypical infection. NG tube is in the stomach. Endotracheal tube is 3 cm above the carina. No effusions or acute bony abnormality. IMPRESSION: Cardiomegaly. Increasing interstitial prominence, right greater than left, favor interstitial edema although atypical  infection cannot be excluded. Electronically Signed   By: Rolm Baptise M.D.   On: 09/13/2021 22:11    Assessment       PLAN   ***   Laureen Ochs. Bobby Rumpf, [place], [facility]

## 2021-10-10 ENCOUNTER — Telehealth: Payer: Self-pay | Admitting: Gastroenterology

## 2021-10-10 ENCOUNTER — Other Ambulatory Visit: Payer: Self-pay

## 2021-10-10 ENCOUNTER — Inpatient Hospital Stay (HOSPITAL_COMMUNITY)
Admission: EM | Admit: 2021-10-10 | Discharge: 2021-10-13 | DRG: 640 | Disposition: A | Discharge status: Skilled Nursing Facility | Payer: Medicare Other | Source: Ambulatory Visit | Attending: Internal Medicine | Admitting: Internal Medicine

## 2021-10-10 ENCOUNTER — Ambulatory Visit: Payer: Medicare Other | Admitting: Gastroenterology

## 2021-10-10 ENCOUNTER — Encounter (HOSPITAL_COMMUNITY): Payer: Self-pay | Admitting: *Deleted

## 2021-10-10 ENCOUNTER — Emergency Department (HOSPITAL_COMMUNITY): Payer: Medicare Other

## 2021-10-10 DIAGNOSIS — R627 Adult failure to thrive: Secondary | ICD-10-CM | POA: Diagnosis present

## 2021-10-10 DIAGNOSIS — F1320 Sedative, hypnotic or anxiolytic dependence, uncomplicated: Secondary | ICD-10-CM | POA: Diagnosis present

## 2021-10-10 DIAGNOSIS — D509 Iron deficiency anemia, unspecified: Secondary | ICD-10-CM | POA: Diagnosis present

## 2021-10-10 DIAGNOSIS — E869 Volume depletion, unspecified: Principal | ICD-10-CM | POA: Diagnosis present

## 2021-10-10 DIAGNOSIS — G8194 Hemiplegia, unspecified affecting left nondominant side: Secondary | ICD-10-CM | POA: Diagnosis present

## 2021-10-10 DIAGNOSIS — K5903 Drug induced constipation: Secondary | ICD-10-CM

## 2021-10-10 DIAGNOSIS — I5032 Chronic diastolic (congestive) heart failure: Secondary | ICD-10-CM | POA: Diagnosis present

## 2021-10-10 DIAGNOSIS — K219 Gastro-esophageal reflux disease without esophagitis: Secondary | ICD-10-CM | POA: Diagnosis present

## 2021-10-10 DIAGNOSIS — K5900 Constipation, unspecified: Secondary | ICD-10-CM | POA: Diagnosis present

## 2021-10-10 DIAGNOSIS — I421 Obstructive hypertrophic cardiomyopathy: Secondary | ICD-10-CM | POA: Diagnosis present

## 2021-10-10 DIAGNOSIS — Z993 Dependence on wheelchair: Secondary | ICD-10-CM

## 2021-10-10 DIAGNOSIS — I110 Hypertensive heart disease with heart failure: Secondary | ICD-10-CM | POA: Diagnosis present

## 2021-10-10 DIAGNOSIS — G9341 Metabolic encephalopathy: Secondary | ICD-10-CM | POA: Diagnosis present

## 2021-10-10 DIAGNOSIS — E1165 Type 2 diabetes mellitus with hyperglycemia: Secondary | ICD-10-CM | POA: Diagnosis present

## 2021-10-10 DIAGNOSIS — Z66 Do not resuscitate: Secondary | ICD-10-CM | POA: Diagnosis present

## 2021-10-10 DIAGNOSIS — Z8701 Personal history of pneumonia (recurrent): Secondary | ICD-10-CM

## 2021-10-10 DIAGNOSIS — E872 Acidosis, unspecified: Secondary | ICD-10-CM | POA: Diagnosis present

## 2021-10-10 DIAGNOSIS — I959 Hypotension, unspecified: Secondary | ICD-10-CM | POA: Diagnosis present

## 2021-10-10 DIAGNOSIS — Z7901 Long term (current) use of anticoagulants: Secondary | ICD-10-CM

## 2021-10-10 DIAGNOSIS — F1120 Opioid dependence, uncomplicated: Secondary | ICD-10-CM | POA: Diagnosis present

## 2021-10-10 DIAGNOSIS — I4892 Unspecified atrial flutter: Secondary | ICD-10-CM | POA: Diagnosis present

## 2021-10-10 DIAGNOSIS — R531 Weakness: Secondary | ICD-10-CM

## 2021-10-10 DIAGNOSIS — I6930 Unspecified sequelae of cerebral infarction: Secondary | ICD-10-CM | POA: Diagnosis not present

## 2021-10-10 DIAGNOSIS — Z882 Allergy status to sulfonamides status: Secondary | ICD-10-CM

## 2021-10-10 DIAGNOSIS — K5641 Fecal impaction: Secondary | ICD-10-CM | POA: Diagnosis present

## 2021-10-10 DIAGNOSIS — K8020 Calculus of gallbladder without cholecystitis without obstruction: Secondary | ICD-10-CM | POA: Diagnosis not present

## 2021-10-10 DIAGNOSIS — Z8616 Personal history of COVID-19: Secondary | ICD-10-CM | POA: Diagnosis not present

## 2021-10-10 DIAGNOSIS — G8929 Other chronic pain: Secondary | ICD-10-CM | POA: Diagnosis present

## 2021-10-10 DIAGNOSIS — I480 Paroxysmal atrial fibrillation: Secondary | ICD-10-CM | POA: Diagnosis present

## 2021-10-10 DIAGNOSIS — Z681 Body mass index (BMI) 19 or less, adult: Secondary | ICD-10-CM

## 2021-10-10 DIAGNOSIS — Z794 Long term (current) use of insulin: Secondary | ICD-10-CM | POA: Diagnosis not present

## 2021-10-10 DIAGNOSIS — L8990 Pressure ulcer of unspecified site, unspecified stage: Secondary | ICD-10-CM | POA: Diagnosis present

## 2021-10-10 DIAGNOSIS — Z8249 Family history of ischemic heart disease and other diseases of the circulatory system: Secondary | ICD-10-CM

## 2021-10-10 DIAGNOSIS — L89321 Pressure ulcer of left buttock, stage 1: Secondary | ICD-10-CM | POA: Diagnosis present

## 2021-10-10 DIAGNOSIS — E785 Hyperlipidemia, unspecified: Secondary | ICD-10-CM | POA: Diagnosis present

## 2021-10-10 DIAGNOSIS — T402X5A Adverse effect of other opioids, initial encounter: Secondary | ICD-10-CM | POA: Diagnosis present

## 2021-10-10 DIAGNOSIS — Z87891 Personal history of nicotine dependence: Secondary | ICD-10-CM

## 2021-10-10 DIAGNOSIS — F32A Depression, unspecified: Secondary | ICD-10-CM | POA: Diagnosis present

## 2021-10-10 DIAGNOSIS — Z20822 Contact with and (suspected) exposure to covid-19: Secondary | ICD-10-CM | POA: Diagnosis present

## 2021-10-10 DIAGNOSIS — F419 Anxiety disorder, unspecified: Secondary | ICD-10-CM | POA: Diagnosis present

## 2021-10-10 DIAGNOSIS — K224 Dyskinesia of esophagus: Secondary | ICD-10-CM | POA: Diagnosis present

## 2021-10-10 DIAGNOSIS — Z79899 Other long term (current) drug therapy: Secondary | ICD-10-CM

## 2021-10-10 DIAGNOSIS — Z86718 Personal history of other venous thrombosis and embolism: Secondary | ICD-10-CM

## 2021-10-10 DIAGNOSIS — D508 Other iron deficiency anemias: Secondary | ICD-10-CM

## 2021-10-10 DIAGNOSIS — F39 Unspecified mood [affective] disorder: Secondary | ICD-10-CM | POA: Diagnosis present

## 2021-10-10 DIAGNOSIS — A419 Sepsis, unspecified organism: Secondary | ICD-10-CM | POA: Diagnosis present

## 2021-10-10 DIAGNOSIS — I69354 Hemiplegia and hemiparesis following cerebral infarction affecting left non-dominant side: Secondary | ICD-10-CM

## 2021-10-10 DIAGNOSIS — I9589 Other hypotension: Secondary | ICD-10-CM | POA: Diagnosis present

## 2021-10-10 DIAGNOSIS — K222 Esophageal obstruction: Secondary | ICD-10-CM | POA: Diagnosis present

## 2021-10-10 DIAGNOSIS — Z9189 Other specified personal risk factors, not elsewhere classified: Secondary | ICD-10-CM

## 2021-10-10 DIAGNOSIS — F209 Schizophrenia, unspecified: Secondary | ICD-10-CM | POA: Diagnosis present

## 2021-10-10 HISTORY — DX: Irritable bowel syndrome, unspecified: K58.9

## 2021-10-10 HISTORY — DX: Hypotension, unspecified: I95.9

## 2021-10-10 HISTORY — DX: Hyperlipidemia, unspecified: E78.5

## 2021-10-10 HISTORY — DX: Vitamin D deficiency, unspecified: E55.9

## 2021-10-10 HISTORY — DX: Obstructive hypertrophic cardiomyopathy: I42.1

## 2021-10-10 HISTORY — DX: Iron deficiency anemia, unspecified: D50.9

## 2021-10-10 HISTORY — DX: Heart failure, unspecified: I50.9

## 2021-10-10 LAB — GLUCOSE, CAPILLARY: Glucose-Capillary: 148 mg/dL — ABNORMAL HIGH (ref 70–99)

## 2021-10-10 LAB — TYPE AND SCREEN
ABO/RH(D): B POS
Antibody Screen: NEGATIVE

## 2021-10-10 LAB — BLOOD GAS, VENOUS
Acid-Base Excess: 5.6 mmol/L — ABNORMAL HIGH (ref 0.0–2.0)
Bicarbonate: 31.9 mmol/L — ABNORMAL HIGH (ref 20.0–28.0)
Drawn by: 7073
O2 Saturation: 39.7 %
Patient temperature: 36.9
pCO2, Ven: 54 mmHg (ref 44–60)
pH, Ven: 7.38 (ref 7.25–7.43)
pO2, Ven: <31 mmHg — CL (ref 32–45)

## 2021-10-10 LAB — CBC WITH DIFFERENTIAL/PLATELET
Abs Immature Granulocytes: 0.03 10*3/uL (ref 0.00–0.07)
Basophils Absolute: 0 10*3/uL (ref 0.0–0.1)
Basophils Relative: 0 %
Eosinophils Absolute: 0.1 10*3/uL (ref 0.0–0.5)
Eosinophils Relative: 1 %
HCT: 36.8 % (ref 36.0–46.0)
Hemoglobin: 11.9 g/dL — ABNORMAL LOW (ref 12.0–15.0)
Immature Granulocytes: 0 %
Lymphocytes Relative: 13 %
Lymphs Abs: 1.3 10*3/uL (ref 0.7–4.0)
MCH: 28 pg (ref 26.0–34.0)
MCHC: 32.3 g/dL (ref 30.0–36.0)
MCV: 86.6 fL (ref 80.0–100.0)
Monocytes Absolute: 0.5 10*3/uL (ref 0.1–1.0)
Monocytes Relative: 5 %
Neutro Abs: 7.4 10*3/uL (ref 1.7–7.7)
Neutrophils Relative %: 81 %
Platelets: 286 10*3/uL (ref 150–400)
RBC: 4.25 MIL/uL (ref 3.87–5.11)
RDW: 15 % (ref 11.5–15.5)
WBC: 9.4 10*3/uL (ref 4.0–10.5)
nRBC: 0 % (ref 0.0–0.2)

## 2021-10-10 LAB — PROTIME-INR
INR: 1.5 — ABNORMAL HIGH (ref 0.8–1.2)
Prothrombin Time: 17.8 seconds — ABNORMAL HIGH (ref 11.4–15.2)

## 2021-10-10 LAB — RESP PANEL BY RT-PCR (FLU A&B, COVID) ARPGX2
Influenza A by PCR: NEGATIVE
Influenza B by PCR: NEGATIVE
SARS Coronavirus 2 by RT PCR: NEGATIVE

## 2021-10-10 LAB — CBC
HCT: 33.9 % — ABNORMAL LOW (ref 36.0–46.0)
Hemoglobin: 10.8 g/dL — ABNORMAL LOW (ref 12.0–15.0)
MCH: 27.7 pg (ref 26.0–34.0)
MCHC: 31.9 g/dL (ref 30.0–36.0)
MCV: 86.9 fL (ref 80.0–100.0)
Platelets: 249 10*3/uL (ref 150–400)
RBC: 3.9 MIL/uL (ref 3.87–5.11)
RDW: 15.1 % (ref 11.5–15.5)
WBC: 9.4 10*3/uL (ref 4.0–10.5)
nRBC: 0 % (ref 0.0–0.2)

## 2021-10-10 LAB — LACTIC ACID, PLASMA
Lactic Acid, Venous: 2.6 mmol/L (ref 0.5–1.9)
Lactic Acid, Venous: 2.1 mmol/L (ref 0.5–1.9)
Lactic Acid, Venous: 1.2 mmol/L (ref 0.5–1.9)

## 2021-10-10 LAB — COMPREHENSIVE METABOLIC PANEL
ALT: 10 U/L (ref 0–44)
AST: 13 U/L — ABNORMAL LOW (ref 15–41)
Albumin: 3.4 g/dL — ABNORMAL LOW (ref 3.5–5.0)
Alkaline Phosphatase: 106 U/L (ref 38–126)
Anion gap: 8 (ref 5–15)
BUN: 13 mg/dL (ref 8–23)
CO2: 27 mmol/L (ref 22–32)
Calcium: 8.6 mg/dL — ABNORMAL LOW (ref 8.9–10.3)
Chloride: 101 mmol/L (ref 98–111)
Creatinine, Ser: 0.97 mg/dL (ref 0.44–1.00)
GFR, Estimated: >60 mL/min (ref >60)
Glucose, Bld: 210 mg/dL — ABNORMAL HIGH (ref 70–99)
Potassium: 3.5 mmol/L (ref 3.5–5.1)
Sodium: 136 mmol/L (ref 135–145)
Total Bilirubin: 0.4 mg/dL (ref 0.3–1.2)
Total Protein: 6.7 g/dL (ref 6.5–8.1)

## 2021-10-10 LAB — URINALYSIS, ROUTINE W REFLEX MICROSCOPIC
Bilirubin Urine: NEGATIVE
Glucose, UA: NEGATIVE mg/dL
Hgb urine dipstick: NEGATIVE
Ketones, ur: NEGATIVE mg/dL
Nitrite: NEGATIVE
Protein, ur: NEGATIVE mg/dL
Specific Gravity, Urine: 1.01 (ref 1.005–1.030)
pH: 7 (ref 5.0–8.0)

## 2021-10-10 LAB — TROPONIN I (HIGH SENSITIVITY)
Troponin I (High Sensitivity): 3 ng/L (ref <18)
Troponin I (High Sensitivity): 3 ng/L (ref <18)

## 2021-10-10 LAB — T4, FREE: Free T4: 1.45 ng/dL — ABNORMAL HIGH (ref 0.61–1.12)

## 2021-10-10 LAB — TSH: TSH: 1.685 u[IU]/mL (ref 0.350–4.500)

## 2021-10-10 LAB — CREATININE, SERUM
Creatinine, Ser: 0.69 mg/dL (ref 0.44–1.00)
GFR, Estimated: >60 mL/min (ref >60)

## 2021-10-10 LAB — APTT: aPTT: 28 seconds (ref 24–36)

## 2021-10-10 LAB — CBG MONITORING, ED: Glucose-Capillary: 110 mg/dL — ABNORMAL HIGH (ref 70–99)

## 2021-10-10 MED ORDER — INSULIN DETEMIR 100 UNIT/ML ~~LOC~~ SOLN
12.0000 [IU] | Freq: Every day | SUBCUTANEOUS | Status: DC
  Administered 2021-10-10 – 2021-10-12 (×3): 12 [IU] via SUBCUTANEOUS
  Filled 2021-10-10 (×4): qty 0.12

## 2021-10-10 MED ORDER — IOHEXOL 350 MG/ML SOLN
100.0000 mL | Freq: Once | INTRAVENOUS | Status: AC | PRN
  Administered 2021-10-10: 75 mL via INTRAVENOUS

## 2021-10-10 MED ORDER — METOPROLOL TARTRATE 25 MG PO TABS
12.5000 mg | ORAL_TABLET | Freq: Two times a day (BID) | ORAL | Status: DC
  Administered 2021-10-10 – 2021-10-13 (×5): 12.5 mg via ORAL
  Filled 2021-10-10 (×6): qty 1

## 2021-10-10 MED ORDER — RISPERIDONE 1 MG PO TABS
2.0000 mg | ORAL_TABLET | Freq: Every day | ORAL | Status: DC
  Administered 2021-10-10 – 2021-10-12 (×3): 2 mg via ORAL
  Filled 2021-10-10 (×3): qty 2

## 2021-10-10 MED ORDER — ARIPIPRAZOLE 2 MG PO TABS
2.0000 mg | ORAL_TABLET | Freq: Every day | ORAL | Status: DC
  Administered 2021-10-11 – 2021-10-13 (×3): 2 mg via ORAL
  Filled 2021-10-10 (×4): qty 1

## 2021-10-10 MED ORDER — DULOXETINE HCL 60 MG PO CPEP
60.0000 mg | ORAL_CAPSULE | Freq: Every day | ORAL | Status: DC
  Administered 2021-10-10 – 2021-10-13 (×4): 60 mg via ORAL
  Filled 2021-10-10: qty 1
  Filled 2021-10-10: qty 2
  Filled 2021-10-10 (×2): qty 1

## 2021-10-10 MED ORDER — ACETAMINOPHEN 325 MG PO TABS
650.0000 mg | ORAL_TABLET | Freq: Four times a day (QID) | ORAL | Status: DC | PRN
  Administered 2021-10-11 – 2021-10-13 (×2): 650 mg via ORAL
  Filled 2021-10-10 (×2): qty 2

## 2021-10-10 MED ORDER — SORBITOL 70 % SOLN
300.0000 mL | TOPICAL_OIL | Freq: Once | ORAL | Status: DC
  Filled 2021-10-10: qty 90

## 2021-10-10 MED ORDER — POLYVINYL ALCOHOL 1.4 % OP SOLN: 1.0000 [drp] | Freq: Three times a day (TID) | OPHTHALMIC | Status: DC | PRN

## 2021-10-10 MED ORDER — LACTATED RINGERS IV SOLN: INTRAVENOUS | Status: DC

## 2021-10-10 MED ORDER — CLONAZEPAM 0.5 MG PO TABS
0.5000 mg | ORAL_TABLET | Freq: Three times a day (TID) | ORAL | Status: DC
  Administered 2021-10-10 – 2021-10-13 (×9): 0.5 mg via ORAL
  Filled 2021-10-10 (×9): qty 1

## 2021-10-10 MED ORDER — METRONIDAZOLE 500 MG/100ML IV SOLN: 500.0000 mg | Freq: Once | INTRAVENOUS | Status: DC

## 2021-10-10 MED ORDER — RISPERIDONE 1 MG PO TABS
1.0000 mg | ORAL_TABLET | Freq: Two times a day (BID) | ORAL | Status: DC
  Administered 2021-10-10 – 2021-10-13 (×6): 1 mg via ORAL
  Filled 2021-10-10 (×6): qty 1

## 2021-10-10 MED ORDER — SODIUM CHLORIDE 0.9 % IV SOLN: 2.0000 g | Freq: Once | INTRAVENOUS | Status: DC

## 2021-10-10 MED ORDER — INSULIN ASPART 100 UNIT/ML IJ SOLN
3.0000 [IU] | Freq: Three times a day (TID) | INTRAMUSCULAR | Status: DC
  Administered 2021-10-12 – 2021-10-13 (×6): 3 [IU] via SUBCUTANEOUS

## 2021-10-10 MED ORDER — HEPARIN SODIUM (PORCINE) 5000 UNIT/ML IJ SOLN
5000.0000 [IU] | Freq: Three times a day (TID) | INTRAMUSCULAR | Status: DC
  Administered 2021-10-10 – 2021-10-11 (×2): 5000 [IU] via SUBCUTANEOUS
  Filled 2021-10-10 (×2): qty 1

## 2021-10-10 MED ORDER — LINAGLIPTIN 5 MG PO TABS
5.0000 mg | ORAL_TABLET | Freq: Every day | ORAL | Status: DC
  Administered 2021-10-11 – 2021-10-13 (×3): 5 mg via ORAL
  Filled 2021-10-10 (×3): qty 1

## 2021-10-10 MED ORDER — VANCOMYCIN HCL 1250 MG/250ML IV SOLN
1250.0000 mg | Freq: Once | INTRAVENOUS | Status: AC
  Administered 2021-10-10: 1250 mg via INTRAVENOUS
  Filled 2021-10-10: qty 250

## 2021-10-10 MED ORDER — LACTATED RINGERS IV BOLUS (SEPSIS)
1000.0000 mL | Freq: Once | INTRAVENOUS | Status: AC
  Administered 2021-10-10: 1000 mL via INTRAVENOUS

## 2021-10-10 MED ORDER — VANCOMYCIN HCL IN DEXTROSE 1-5 GM/200ML-% IV SOLN: 1000.0000 mg | INTRAVENOUS | Status: DC

## 2021-10-10 MED ORDER — IOHEXOL 300 MG/ML  SOLN
100.0000 mL | Freq: Once | INTRAMUSCULAR | Status: AC | PRN
  Administered 2021-10-10: 100 mL via INTRAVENOUS

## 2021-10-10 MED ORDER — ATORVASTATIN CALCIUM 40 MG PO TABS
40.0000 mg | ORAL_TABLET | Freq: Every day | ORAL | Status: DC
  Administered 2021-10-10 – 2021-10-11 (×2): 40 mg via ORAL
  Filled 2021-10-10 (×2): qty 1

## 2021-10-10 MED ORDER — LACTATED RINGERS IV SOLN: INTRAVENOUS | Status: AC

## 2021-10-10 MED ORDER — IPRATROPIUM-ALBUTEROL 0.5-2.5 (3) MG/3ML IN SOLN: 3.0000 mL | Freq: Four times a day (QID) | RESPIRATORY_TRACT | Status: DC | PRN

## 2021-10-10 MED ORDER — POLYETHYLENE GLYCOL 3350 17 G PO PACK
17.0000 g | PACK | Freq: Every day | ORAL | Status: DC
  Administered 2021-10-10 – 2021-10-13 (×4): 17 g via ORAL
  Filled 2021-10-10 (×4): qty 1

## 2021-10-10 MED ORDER — LACTATED RINGERS IV BOLUS (SEPSIS)
250.0000 mL | Freq: Once | INTRAVENOUS | Status: AC
  Administered 2021-10-10: 250 mL via INTRAVENOUS

## 2021-10-10 MED ORDER — FENTANYL CITRATE PF 50 MCG/ML IJ SOSY
25.0000 ug | PREFILLED_SYRINGE | Freq: Once | INTRAMUSCULAR | Status: AC
  Administered 2021-10-10: 25 ug via INTRAVENOUS
  Filled 2021-10-10: qty 1

## 2021-10-10 MED ORDER — VANCOMYCIN HCL IN DEXTROSE 1-5 GM/200ML-% IV SOLN: 1000.0000 mg | Freq: Once | INTRAVENOUS | Status: DC

## 2021-10-10 MED ORDER — PROCHLORPERAZINE EDISYLATE 10 MG/2ML IJ SOLN: 10.0000 mg | INTRAMUSCULAR | Status: DC | PRN

## 2021-10-10 MED ORDER — PANTOPRAZOLE SODIUM 40 MG PO TBEC
40.0000 mg | DELAYED_RELEASE_TABLET | Freq: Two times a day (BID) | ORAL | Status: DC
  Administered 2021-10-10 – 2021-10-13 (×7): 40 mg via ORAL
  Filled 2021-10-10 (×7): qty 1

## 2021-10-10 MED ORDER — ACETAMINOPHEN 650 MG RE SUPP: 650.0000 mg | Freq: Four times a day (QID) | RECTAL | Status: DC | PRN

## 2021-10-10 MED ORDER — POLYVINYL ALCOHOL-POVIDONE 5-6 MG/ML OP SOLN: 1.0000 [drp] | Freq: Three times a day (TID) | OPHTHALMIC | Status: DC

## 2021-10-10 MED ORDER — BUSPIRONE HCL 5 MG PO TABS
7.5000 mg | ORAL_TABLET | Freq: Two times a day (BID) | ORAL | Status: DC
  Administered 2021-10-10 – 2021-10-13 (×6): 7.5 mg via ORAL
  Filled 2021-10-10 (×6): qty 2

## 2021-10-10 MED ORDER — OXYBUTYNIN CHLORIDE ER 5 MG PO TB24
5.0000 mg | ORAL_TABLET | Freq: Every day | ORAL | Status: DC
  Administered 2021-10-10 – 2021-10-13 (×4): 5 mg via ORAL
  Filled 2021-10-10 (×4): qty 1

## 2021-10-10 MED ORDER — SODIUM CHLORIDE 0.9 % IV SOLN
1.0000 g | Freq: Three times a day (TID) | INTRAVENOUS | Status: DC
  Administered 2021-10-10 – 2021-10-11 (×3): 1 g via INTRAVENOUS
  Filled 2021-10-10 (×3): qty 20

## 2021-10-10 MED ORDER — LATANOPROST 0.005 % OP SOLN
1.0000 [drp] | Freq: Every day | OPHTHALMIC | Status: DC
  Administered 2021-10-10 – 2021-10-12 (×3): 1 [drp] via OPHTHALMIC
  Filled 2021-10-10: qty 2.5

## 2021-10-10 MED ORDER — INSULIN ASPART 100 UNIT/ML IJ SOLN
0.0000 [IU] | Freq: Three times a day (TID) | INTRAMUSCULAR | Status: DC
  Administered 2021-10-11 (×2): 2 [IU] via SUBCUTANEOUS
  Administered 2021-10-12: 3 [IU] via SUBCUTANEOUS
  Administered 2021-10-13: 1 [IU] via SUBCUTANEOUS
  Administered 2021-10-13: 2 [IU] via SUBCUTANEOUS
  Administered 2021-10-13: 1 [IU] via SUBCUTANEOUS

## 2021-10-10 MED ORDER — LUBIPROSTONE 8 MCG PO CAPS
8.0000 ug | ORAL_CAPSULE | Freq: Every day | ORAL | Status: DC
  Administered 2021-10-11 – 2021-10-13 (×3): 8 ug via ORAL
  Filled 2021-10-10 (×3): qty 1

## 2021-10-10 MED ORDER — HYDROCODONE-ACETAMINOPHEN 10-325 MG PO TABS
1.0000 | ORAL_TABLET | Freq: Three times a day (TID) | ORAL | Status: DC | PRN
  Administered 2021-10-10 – 2021-10-13 (×5): 1 via ORAL
  Filled 2021-10-10 (×5): qty 1

## 2021-10-10 MED ORDER — SODIUM CHLORIDE 0.9 % IV SOLN: 1.0000 g | Freq: Three times a day (TID) | INTRAVENOUS | Status: DC

## 2021-10-10 MED ORDER — MIDODRINE HCL 5 MG PO TABS
10.0000 mg | ORAL_TABLET | Freq: Three times a day (TID) | ORAL | Status: DC
  Administered 2021-10-10 – 2021-10-13 (×10): 10 mg via ORAL
  Filled 2021-10-10 (×10): qty 2

## 2021-10-10 MED ORDER — LACTATED RINGERS IV BOLUS (SEPSIS)
500.0000 mL | Freq: Once | INTRAVENOUS | Status: AC
  Administered 2021-10-10: 500 mL via INTRAVENOUS

## 2021-10-10 MED ORDER — FENTANYL CITRATE PF 50 MCG/ML IJ SOSY: 25.0000 ug | PREFILLED_SYRINGE | INTRAMUSCULAR | Status: DC | PRN

## 2021-10-10 MED ORDER — AMIODARONE HCL 200 MG PO TABS
200.0000 mg | ORAL_TABLET | Freq: Every day | ORAL | Status: DC
  Administered 2021-10-10 – 2021-10-13 (×4): 200 mg via ORAL
  Filled 2021-10-10 (×4): qty 1

## 2021-10-10 NOTE — Assessment & Plan Note (Signed)
-   Multiple recent hospitalizations and poor quality of life

## 2021-10-10 NOTE — Assessment & Plan Note (Signed)
-   Protonix for GI protection, Tums prn for symptoms

## 2021-10-10 NOTE — Assessment & Plan Note (Addendum)
-   Resume Levemir and sliding scale coverage - Continue CBG monitoring CBG (last 3)  Recent Labs    10/11/21 0354 10/11/21 0813 10/11/21 1112  GLUCAP 125* 96 173*

## 2021-10-10 NOTE — Progress Notes (Signed)
Elink following code sepsis °

## 2021-10-10 NOTE — Assessment & Plan Note (Signed)
-   This is a chronic issue for the patient and she is on midodrine 10 mg 3 times daily for this

## 2021-10-10 NOTE — Hospital Course (Signed)
76 year old female well-known to the medical service given frequent recent hospitalizations and ongoing failure to thrive was recently discharged from the hospital 09/19/2021.  Seen in the ED 2 days later 09/21/2021.  She had discharged to SNF.  She has a history of schizophrenia, hypertension, type 2 diabetes mellitus, chronic anxiety with benzodiazepine dependence, chronic pain and opioid dependence, paroxysmal atrial fibrillation, DVT, multiple hospitalizations for sepsis and shock, chronic hypotension on midodrine, GERD and diastolic heart failure.  Recently treated for acute tracheobronchitis and also noted to have hypertrophic obstructive cardiomyopathy.  She has a history of stricture and stenosis of the esophagus as well.  She presented to the [place] office today for scheduled outpatient visit.  She was noted to be severely hypotensive, lethargic and complaining of abdominal pain.  EMS was called to take her to the emergency department.  She was noted to have a presentation of septic shock.  She was hypotensive, appeared clinically dry and complained of abdominal pain. CT abdomen was notable for cholelithiasis. Surgery recommended she have a HIDA scan.  She was resuscitated with fluids and antibiotics.  She is being admitted for further management.

## 2021-10-10 NOTE — Assessment & Plan Note (Signed)
-   Resume home behavioral health medications

## 2021-10-10 NOTE — Progress Notes (Signed)
Notified bedside nurse of need to draw repeat lactic acid. 

## 2021-10-10 NOTE — Assessment & Plan Note (Signed)
-   Soft foods only

## 2021-10-10 NOTE — Assessment & Plan Note (Addendum)
-   resuming home apixaban now that HIDA scan normal

## 2021-10-10 NOTE — ED Triage Notes (Addendum)
Pt brought to ED today with c/o hypotension. She reports she is from [facility] but the paperwork in the back of her wheelchair shows she is from [facility]. She reports she was at the doctor's office for a "check-up" and they found her BP to be low. Pt reports she feels "bad" but isn't able to tell me exactly what that means to her. Pt appears lethargic, pale, skin cool and dry. BP 68/41 in right arm, 48/24 in left arm in triage. Pt brought immediately back to ED room 10.

## 2021-10-10 NOTE — Assessment & Plan Note (Signed)
-   Resuming home behavioral health medications

## 2021-10-10 NOTE — H&P (Signed)
History and Physical  Gordo FXJ:[id number] DOB: 1945-09-29 DOA: 10/10/2021  PCP: Moss Mc, NP  Patient coming from: sent from GI office Level of care: Telemetry  I have personally briefly reviewed patient's old medical records in [place]  Chief Complaint: hypotension  HPI: [name] is a 76 year old female well-known to the medical service given frequent recent hospitalizations and ongoing failure to thrive was recently discharged from the hospital 09/19/2021.  Seen in the ED 2 days later 09/21/2021.  She had discharged to SNF.  She has a history of schizophrenia, hypertension, type 2 diabetes mellitus, chronic anxiety with benzodiazepine dependence, chronic pain and opioid dependence, paroxysmal atrial fibrillation, DVT, multiple hospitalizations for sepsis and shock, chronic hypotension on midodrine, GERD and diastolic heart failure.  Recently treated for acute tracheobronchitis and also noted to have hypertrophic obstructive cardiomyopathy.  She has a history of stricture and stenosis of the esophagus as well.  She presented to the [place] office today for scheduled outpatient visit.  She was noted to be severely hypotensive, lethargic and complaining of abdominal pain.  EMS was called to take her to the emergency department.  She was noted to have a presentation of septic shock.  She was hypotensive, appeared clinically dry and complained of abdominal pain. CT abdomen was notable for cholelithiasis. Surgery recommended she have a HIDA scan.  She was resuscitated with fluids and antibiotics.  She is being admitted for further management.      Past Medical History:  Diagnosis Date   Anxiety    Arthritis    Atrial fibrillation (HCC)    Atrial flutter (HCC)    CHF (congestive heart failure) (HCC)    Collagen vascular disease ([place])    COVID-19 virus infection    COVID-19+ approx 01/24/19; asymptomatic course with full recovery   Dependence  on wheelchair    pivot/transfers   Depression    History of psychosis and previous suicide attempt   DVT, lower extremity, recurrent ([place])    Long-term Coumadin per Dr. Legrand Rams   Essential hypertension    GERD (gastroesophageal reflux disease)    Hemiplegia ([place]) 2010   Left side   History of stroke    Acute infarct and right cerebral white matter small vessel disease 12/10   Hyperlipidemia    Hypotension, unspecified    IDA (iron deficiency anemia)    Irritable bowel syndrome without diarrhea    Leg DVT (deep venous thromboembolism), acute ([place]) 2006   Obstructive hypertrophic cardiomyopathy (HCC)    Orthostatic hypotension    Schizophrenia ([place])    Stroke ([place])    left sided weakness   Type 2 diabetes mellitus ([place])    Vitamin D deficiency     Past Surgical History:  Procedure Laterality Date   BACK SURGERY     BIOPSY N/A 11/24/2014   Procedure: BIOPSY;  Surgeon: Danie Binder, MD;  Location: [facility];  Service: Endoscopy;  Laterality: N/A;   BIOPSY  05/17/2016   Procedure: BIOPSY;  Surgeon: Danie Binder, MD;  Location: [facility];  Service: Endoscopy;;  gastric biopsy   COLONOSCOPY WITH PROPOFOL N/A 11/24/2014   Dr. Rudie Meyer polyps removed/moderate sized internal hemorrhoids, tubular adenomas. Next surveillance in 3 years   ESOPHAGEAL DILATION N/A 07/23/2021   Procedure: ESOPHAGEAL DILATION;  Surgeon: Eloise Harman, DO;  Location: [facility];  Service: Endoscopy;  Laterality: N/A;   ESOPHAGOGASTRODUODENOSCOPY (EGD) WITH PROPOFOL N/A 11/24/2014   Dr. Clayburn Pert  HH/patent stricture at the gastroesophageal junction, mild non-erosive gastritis, path negative for H.pylori or celiac sprue   ESOPHAGOGASTRODUODENOSCOPY (EGD) WITH PROPOFOL N/A 05/17/2016   Procedure: ESOPHAGOGASTRODUODENOSCOPY (EGD) WITH PROPOFOL;  Surgeon: Danie Binder, MD;  [facility];  Service: Endoscopy;  Laterality: N/A;  12:45pm   ESOPHAGOGASTRODUODENOSCOPY (EGD) WITH PROPOFOL N/A 07/23/2021    Procedure: ESOPHAGOGASTRODUODENOSCOPY (EGD) WITH PROPOFOL;  Surgeon: Eloise Harman, DO;  [facility];  Service: Endoscopy;  Laterality: N/A;   GIVENS CAPSULE STUDY N/A 12/11/2014   MULTILPLE EROSION IN the stomach WITH ACTIVE OOZING. OCCASIONAL EROSIONS AND RARE ULCER SEEN IN PROXIMAL SMALL BOWEL . No masses or AVMs SEEN. NO OLD BLOOD OR FRESH BLOOD SEEN.    POLYPECTOMY N/A 11/24/2014   Procedure: POLYPECTOMY;  Surgeon: Danie Binder, MD;  Location: [facility];  Service: Endoscopy;  Laterality: N/A;   SAVORY DILATION N/A 05/17/2016   Procedure: SAVORY DILATION;  Surgeon: Danie Binder, MD;  [facility];  Service: Endoscopy;  Laterality: N/A;     reports that she quit smoking about 26 years ago. Her smoking use included cigarettes. She has a 5.00 pack-year smoking history. She has never used smokeless tobacco. She reports that she does not drink alcohol and does not use drugs.  Allergies  Allergen Reactions   Sulfa Antibiotics Rash    Family History  Problem Relation Age of Onset   Hypertension Mother    Colon cancer Neg Hx     Prior to Admission medications   Medication Sig Start Date End Date Taking? Authorizing Provider  ABILIFY 2 MG tablet Take 1 tablet (2 mg total) by mouth daily. 11/09/20  Yes [name], MD  acetaminophen (TYLENOL) 325 MG tablet Take 650 mg by mouth 3 (three) times daily as needed for mild pain or moderate pain.    Yes [provider]  albuterol (VENTOLIN HFA) 108 (90 Base) MCG/ACT inhaler Inhale 2 puffs into the lungs every 6 (six) hours as needed for wheezing or shortness of breath. 05/20/21  Yes Barton Dubois, MD  amiodarone (PACERONE) 200 MG tablet Take 1 tablet (200 mg total) by mouth daily. 04/24/21  Yes Barton Dubois, MD  apixaban (ELIQUIS) 5 MG TABS tablet Take 1 tablet (5 mg total) by mouth 2 (two) times daily. 02/21/20  Yes Richarda Osmond, MD  atorvastatin (LIPITOR) 40 MG tablet Take 1 tablet by mouth at bedtime.  05/30/20  Yes [provider]  busPIRone (BUSPAR) 7.5 MG tablet Take 7.5 mg by mouth 2 (two) times daily. 08/31/21  Yes [provider]  cholecalciferol (VITAMIN D3) 25 MCG (1000 UNIT) tablet Take 2,000 Units by mouth daily.   Yes [provider]  clonazePAM (KLONOPIN) 0.5 MG tablet Take 1 tablet (0.5 mg total) by mouth 3 (three) times daily as needed for anxiety. Patient taking differently: Take 0.5 mg by mouth 3 (three) times daily. 05/20/21  Yes Barton Dubois, MD  DULoxetine (CYMBALTA) 60 MG capsule Take 1 capsule by mouth daily. 08/26/21  Yes [provider]  GOODSENSE ARTIFICIAL TEARS 0.5-0.6 % SOLN Apply 1 drop to eye 3 (three) times daily. 06/03/21  Yes [provider]  HYDROcodone-acetaminophen (NORCO) 10-325 MG tablet Take 1 tablet by mouth every 8 (eight) hours as needed for severe pain. Patient taking differently: Take 1 tablet by mouth in the morning, at noon, and at bedtime. 05/20/21  Yes Barton Dubois, MD  insulin detemir (LEVEMIR FLEXTOUCH) 100 UNIT/ML FlexPen INJECT 22 UNITS SUBCUTANEOUSLY AT BEDTIME.(HOLD  IF BS<70: CALL MD IF BS> 400) Patient taking differently: 22 Units at bedtime. 07/15/21  Yes Reardon, Juanetta Beets, NP  latanoprost (XALATAN) 0.005 % ophthalmic solution Place 1 drop into both eyes at bedtime.   Yes [provider]  loratadine (CLARITIN) 10 MG tablet Take 10 mg by mouth daily.   Yes [provider]  lubiprostone (AMITIZA) 8 MCG capsule Take 1 capsule by mouth daily. 08/10/21  Yes [provider]  metoCLOPramide (REGLAN) 5 MG tablet Take 5 mg by mouth daily as needed for nausea or vomiting.   Yes [provider]  metoprolol tartrate (LOPRESSOR) 25 MG tablet Take 12.5 mg by mouth 2 (two) times daily.   Yes [provider]  midodrine (PROAMATINE) 10 MG tablet Take 10 mg by mouth 3 (three) times daily.   Yes [provider]  mirtazapine (REMERON) 30 MG tablet Take 30 mg by mouth at  bedtime.   Yes [provider]  Multiple Vitamin (MULTIVITAMIN) tablet Take 1 tablet by mouth daily.   Yes [provider]  naloxegol oxalate (MOVANTIK) 12.5 MG TABS tablet Take 1 tablet (12.5 mg total) by mouth daily. 04/24/21  Yes Barton Dubois, MD  NOVOLOG FLEXPEN 100 UNIT/ML FlexPen INJECT SUBCUTANEOUSLY AS FOLLOWS WITH MEALS: 90-150=10u: 151-200=11u: 201-250=12u: 251-300=13u: 301-350=14u: 351-400=15u: BS>400=16u & CALL MD. Patient taking differently: 10-16 Units 3 (three) times daily with meals. INJECT SUBCUTANEOUSLY AS FOLLOWS WITH MEALS: 90-150=10u: 151-200=11u: 201-250=12u: 251-300=13u: 301-350=14u: 351-400=15u: BS>400=16u & CALL MD. 02/28/21  Yes Brita Romp, NP  omeprazole (PRILOSEC) 40 MG capsule Take 40 mg by mouth daily.   Yes [provider]  ondansetron (ZOFRAN) 4 MG tablet Take 4 mg by mouth 2 (two) times daily. 06/24/21  Yes [provider]  oxybutynin (DITROPAN-XL) 5 MG 24 hr tablet Take 5 mg by mouth daily. 04/20/21  Yes [provider]  pantoprazole (PROTONIX) 40 MG tablet Take 1 tablet (40 mg total) by mouth 2 (two) times daily before a meal. 11/17/20  Yes Mahala Menghini, PA-C  polyethylene glycol (MIRALAX / GLYCOLAX) 17 g packet Take 17 g by mouth daily. 04/24/21  Yes Barton Dubois, MD  risperiDONE (RISPERDAL) 1 MG tablet Take 1 mg by mouth 2 (two) times daily. Take one tablet at 900am and 1 tablet at 5pm and '2mg'$  at bedtime   Yes [provider]  risperiDONE (RISPERDAL) 2 MG tablet Take 2 mg by mouth at bedtime. Take '2mg'$  at bedtime and '1mg'$  at 9am and 5p daily 04/04/21  Yes [provider]  SANTYL 250 UNIT/GM ointment Apply 1 Application topically daily. 07/05/21  Yes [provider]  senna (SENOKOT) 8.6 MG tablet Take 1 tablet by mouth daily.   Yes [provider]  Skin Protectants, Misc. (BAZA PROTECT EX) Apply 1 Application topically in the morning and at bedtime. 08/02/21  Yes [provider]  TRADJENTA 5 MG TABS tablet TAKE (1) TABLET BY MOUTH ONCE DAILY. Patient taking differently: Take 5 mg by mouth daily. 04/04/21  Yes Brita Romp, NP  calcium carbonate (TUMS - DOSED IN MG ELEMENTAL CALCIUM) 500 MG chewable tablet Chew 1 tablet by mouth 4 (four) times daily as needed for heartburn.    [provider]  clonazePAM (KLONOPIN) 0.5 MG tablet Take 1 tablet (0.5 mg total) by mouth 3 (three) times daily as needed for up to 5 days for anxiety. 09/22/21 09/27/21  Wyvonnia Dusky, MD  EASYMAX TEST test strip USE TO CHECK BLOOD SUGAR FOUR TIMES  DAILY.(HOLD IF BS<70: CALL MD IF BS> 400) 07/21/21   Brita Romp, NP  Lancets Thin MISC USE TO CHECK BLOOD SUGAR FOUR TIMES DAILY.(HOLD IF BS<70: CALL MD IF BS> 400) 09/13/21   Brita Romp, NP  metoprolol succinate (TOPROL-XL) 25 MG 24 hr tablet Take 5 tablets (125 mg total) by mouth daily. Patient not taking: Reported on 10/10/2021 09/19/21   Orson Eva, MD  nitrofurantoin, macrocrystal-monohydrate, (MACROBID) 100 MG capsule Take 1 capsule (100 mg total) by mouth every 12 (twelve) hours. Patient not taking: Reported on 10/10/2021 09/19/21   Orson Eva, MD    Physical Exam: Vitals:   10/10/21 1230 10/10/21 1310 10/10/21 1452 10/10/21 1530  BP: 139/66 (!) 149/95  117/70  Pulse: 67 65  77  Resp:  16  15  Temp:   97.9 F (36.6 C)   TempSrc:   Oral   SpO2: 98% 100%  97%  Weight:      Height:        Constitutional: frail, pale, elderly, chronically ill appearing female, edentulous, NAD, calm, comfortable Eyes: PERRL, lids and conjunctivae normal ENMT: Mucous membranes are very dry. Posterior pharynx clear of any exudate or lesions. Edentulous.  Neck: normal, supple, no masses, no thyromegaly Respiratory: clear to auscultation bilaterally, no wheezing, no crackles. Normal respiratory effort. No accessory muscle use.  Cardiovascular: normal s1, s2 sounds, no murmurs / rubs / gallops. No extremity edema. 2+ pedal pulses. No  carotid bruits.  Abdomen: epigastric and RUQ tenderness, no masses palpated. No hepatosplenomegaly. Bowel sounds positive.  Musculoskeletal: no clubbing / cyanosis. No joint deformity upper and lower extremities. Good ROM, no contractures. Normal muscle tone.  Skin: no rashes, lesions, ulcers. No induration Neurologic: CN 2-12 grossly intact. Left hemiparesis.  Sensation intact.    Psychiatric: Poor judgment and insight. Alert and oriented x 3. Normal mood.   Labs on Admission: I have personally reviewed following labs and imaging studies  CBC: Recent Labs  Lab 10/10/21 1028  WBC 9.4  NEUTROABS 7.4  HGB 11.9*  HCT 36.8  MCV 86.6  PLT 220   Basic Metabolic Panel: Recent Labs  Lab 10/10/21 1028  NA 136  K 3.5  CL 101  CO2 27  GLUCOSE 210*  BUN 13  CREATININE 0.97  CALCIUM 8.6*   GFR: Estimated Creatinine Clearance: 44.6 mL/min (by C-G formula based on SCr of 0.97 mg/dL). Liver Function Tests: Recent Labs  Lab 10/10/21 1028  AST 13*  ALT 10  ALKPHOS 106  BILITOT 0.4  PROT 6.7  ALBUMIN 3.4*   No results for input(s): "LIPASE", "AMYLASE" in the last 168 hours. No results for input(s): "AMMONIA" in the last 168 hours. Coagulation Profile: Recent Labs  Lab 10/10/21 1028  INR 1.5*   Cardiac Enzymes: No results for input(s): "CKTOTAL", "CKMB", "CKMBINDEX", "TROPONINI" in the last 168 hours. BNP (last 3 results) No results for input(s): "PROBNP" in the last 8760 hours. HbA1C: No results for input(s): "HGBA1C" in the last 72 hours. CBG: No results for input(s): "GLUCAP" in the last 168 hours. Lipid Profile: No results for input(s): "CHOL", "HDL", "LDLCALC", "TRIG", "CHOLHDL", "LDLDIRECT" in the last 72 hours. Thyroid Function Tests: Recent Labs    10/10/21 1044  TSH 1.685   Anemia Panel: No results for input(s): "VITAMINB12", "FOLATE", "FERRITIN", "TIBC", "IRON", "RETICCTPCT" in the last 72 hours. Urine analysis:    Component Value Date/Time    COLORURINE YELLOW 10/10/2021 1028   APPEARANCEUR CLEAR 10/10/2021 1028   APPEARANCEUR Cloudy (A)  05/01/2016 1525   LABSPEC 1.010 10/10/2021 1028   PHURINE 7.0 10/10/2021 1028   GLUCOSEU NEGATIVE 10/10/2021 1028   HGBUR NEGATIVE 10/10/2021 1028   BILIRUBINUR NEGATIVE 10/10/2021 1028   BILIRUBINUR Negative 05/01/2016 1525   KETONESUR NEGATIVE 10/10/2021 1028   PROTEINUR NEGATIVE 10/10/2021 1028   UROBILINOGEN 0.2 09/14/2014 1703   NITRITE NEGATIVE 10/10/2021 1028   LEUKOCYTESUR SMALL (A) 10/10/2021 1028    Radiological Exams on Admission: CT Angio Chest/Abd/Pel for Dissection W and/or Wo Contrast  Result Date: 10/10/2021 CLINICAL DATA:  Increased weakness, hypotension and bradycardia. EXAM: CT ANGIOGRAPHY CHEST, ABDOMEN AND PELVIS TECHNIQUE: Non-contrast CT of the chest was initially obtained. Multidetector CT imaging through the chest, abdomen and pelvis was performed using the standard protocol during bolus administration of intravenous contrast. Multiplanar reconstructed images and MIPs were obtained and reviewed to evaluate the vascular anatomy. RADIATION DOSE REDUCTION: This exam was performed according to the departmental dose-optimization program which includes automated exposure control, adjustment of the mA and/or kV according to patient size and/or use of iterative reconstruction technique. CONTRAST:  18m OMNIPAQUE IOHEXOL 350 MG/ML SOLN COMPARISON:  CT abdomen pelvis, 07/14/2021. Current chest radiograph. Chest CTA, 05/18/2021. FINDINGS: CTA CHEST FINDINGS Cardiovascular: Thoracic aorta is normal in caliber. No dissection. Mild atherosclerosis. No significant stenosis. Arch branch vessels are widely patent. Heart mildly enlarged. Three-vessel coronary artery calcifications. No pericardial effusion. Main pulmonary artery is mildly dilated to 3.5 cm. Mediastinum/Nodes: Small thyroid nodules up to 1.3 cm. No follow-up recommended. No neck base, mediastinal or hilar masses. Several  subcentimeter but prominent mediastinal lymph nodes. Mildly enlarged subcarinal lymph node, 1.5 cm in short axis. No hilar adenopathy. Normal trachea. Esophagus mildly distended. Small to moderate-sized hiatal hernia. Lungs/Pleura: Mild peripheral interstitial thickening and areas of mild reticular scarring. Minor dependent subsegmental atelectasis. Small calcified granuloma in the right lower lobe. No evidence of pneumonia or pulmonary edema. No mass or suspicious nodule. No pleural effusion or pneumothorax. Musculoskeletal: No fracture or acute finding. No bone lesion. Skeletal structures are demineralized. No chest wall mass. Review of the MIP images confirms the above findings. CTA ABDOMEN AND PELVIS FINDINGS VASCULAR Aorta: Aorta normal in caliber. Diffuse atherosclerosis with no significant stenosis. No dissection. Celiac: Patent without evidence of aneurysm, dissection, vasculitis or significant stenosis. SMA: Patent without evidence of aneurysm, dissection, vasculitis or significant stenosis. Renals: Both renal arteries are patent without evidence of aneurysm, dissection, vasculitis, fibromuscular dysplasia or significant stenosis. IMA: Patent without evidence of aneurysm, dissection, vasculitis or significant stenosis. Inflow: Patent without evidence of aneurysm, dissection, vasculitis or significant stenosis. Veins: No obvious venous abnormality within the limitations of this arterial phase study. Review of the MIP images confirms the above findings. NON-VASCULAR Hepatobiliary: Liver normal in size. 3.4 cm right lobe hemangioma, stable. No other liver masses or lesions. Gallbladder is distended. There are scattered wall calcifications. Small dependent gallstone with another stone projecting in the cystic duct. No pericholecystic inflammation. Common bile duct measures up to 9 mm. No common bile duct stone. Pancreas: Unremarkable. No pancreatic ductal dilatation or surrounding inflammatory changes. Spleen:  Normal in size without focal abnormality. Adrenals/Urinary Tract: No adrenal nodule. Kidneys normal in size, orientation and position. Mild dilation of the intrarenal collecting systems and proximal to mid ureters, relatively symmetric. No ureteral stones. Bladder moderately distended, otherwise unremarkable. Stomach/Bowel: Rectum significantly distended with stool, measuring 8.5 cm in diameter. Colon is also distended with stool, which is moderate to markedly increased. Small bowel is normal in caliber. There is no bowel  wall thickening or inflammatory changes. Small to moderate hiatal hernia. Stomach otherwise unremarkable. Lymphatic: Prominent gastrohepatic ligament lymph nodes, up to 1 cm in short axis. These are stable. No other enlarged lymph nodes. Reproductive: Uterus and bilateral adnexa are unremarkable. Other: No abdominal wall hernia or abnormality. No abdominopelvic ascites. Musculoskeletal: Next lucent and sclerotic area in the S1 vertebra that is stable consistent with a benign lesion. No fracture or acute finding. No suspicious bone lesion. Review of the MIP images confirms the above findings. IMPRESSION: 1. Normal caliber thoracoabdominal aorta. No dissection. No evidence of acute aortic syndrome. 2. Aortic atherosclerosis no significant stenosis. Aortic branch vessels are all widely patent. 3. Distended gallbladder with scattered wall calcification, small dependent stone and small stone in the cystic duct, but no adjacent inflammation. Findings are similar to the prior CT. Consider early acute cholecystitis if there are consistent clinical findings. 4. Small to moderate hiatal hernia. 5. Rectum significantly distended with stool. Moderate to marked increase in colonic stool. No bowel obstruction or inflammation. Electronically Signed   By: Lajean Manes M.D.   On: 10/10/2021 14:07   CT Head Wo Contrast  Result Date: 10/10/2021 CLINICAL DATA:  Altered mental status, hypotension, bradycardia  EXAM: CT HEAD WITHOUT CONTRAST TECHNIQUE: Contiguous axial images were obtained from the base of the skull through the vertex without intravenous contrast. RADIATION DOSE REDUCTION: This exam was performed according to the departmental dose-optimization program which includes automated exposure control, adjustment of the mA and/or kV according to patient size and/or use of iterative reconstruction technique. COMPARISON:  09/21/2021 FINDINGS: Brain: No acute intracranial findings are seen. There are no signs of bleeding within the cranium. Cortical sulci are prominent. There is decreased density in periventricular white matter. Vascular: Scattered arterial calcifications are seen. Skull: There is mild hyperostosis frontalis interna. Sinuses/Orbits: Defects in the medial walls of maxillary sinuses may be postsurgical. There are no air-fluid levels in the visualized paranasal sinuses. Other: None. IMPRESSION: No acute intracranial findings are seen in noncontrast CT brain. Atrophy. Small vessel disease. Electronically Signed   By: Elmer Picker M.D.   On: 10/10/2021 13:54   DG Chest Port 1 View  Result Date: 10/10/2021 CLINICAL DATA:  Possible sepsis EXAM: PORTABLE CHEST 1 VIEW COMPARISON:  Previous studies including the examination of 09/21/2021 FINDINGS: Transverse diameter of heart is slightly increased. There are no signs of pulmonary edema or focal pulmonary consolidation. Left lateral CP angle is indistinct with no interval change. There is no pneumothorax. Degenerative changes are noted in both shoulders. IMPRESSION: There are no new infiltrates or signs of pulmonary edema. Electronically Signed   By: Elmer Picker M.D.   On: 10/10/2021 10:52    EKG: Independently reviewed.   Assessment/Plan Principal Problem:   Septic Shock Active Problems:   Paroxysmal atrial fibrillation (HCC)   Uncontrolled type 2 diabetes mellitus with hyperglycemia, with long-term current use of insulin (HCC)    Schizophrenia (HCC)   Sedentary lifestyle   IDA (iron deficiency anemia)   Stricture and stenosis of esophagus   Left hemiparesis (HCC)   History of cerebrovascular accident (CVA) with residual deficit   Generalized weakness   Hypotensive episode   Constipation-opioid induced   GERD (gastroesophageal reflux disease)   Mood disorder (HCC)   Opioid dependence (HCC)   Pressure injury of skin   HOCM (hypertrophic obstructive cardiomyopathy) ([place])   DNR (do not resuscitate)   Long term current use of anticoagulant therapy   Lactic acidosis    Assessment and  Plan: * Septic Shock - Patient presented severely hypotensive with exam findings concerning for an intra-abdominal infection -Fortunately she responded to IV fluid hydration, fluid boluses and supportive measures -She has been started on broad-spectrum IV antibiotic therapy -Blood cultures x2 ordered and will be followed  Paroxysmal atrial fibrillation (HCC) - Resume home amiodarone and metoprolol -Temporarily holding apixaban until after HIDA scan  Schizophrenia ([place]) - Resume home behavioral health medications  Uncontrolled type 2 diabetes mellitus with hyperglycemia, with long-term current use of insulin (HCC) - Resume Levemir and sliding scale coverage - Continue CBG monitoring  Lactic acidosis - Treating with aggressive IV fluid hydration and will follow lactate  Long term current use of anticoagulant therapy - Temporarily holding apixaban until after HIDA scan completed  DNR (do not resuscitate) - Continue DNR order while in hospital  HOCM (hypertrophic obstructive cardiomyopathy) ([place]) - Most recent echocardiogram 09/19/2021 Severe asymmetric hypertrophy of the basal septum up to 34m. There is evidence of SAM of the MV leaflets with obstruction up to 37 mmHG (variation noted due to afib). 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 Would  consider a cardiac MRI for better  characterization. This was not well characterized on the last echocardiogram. Left ventricular ejection fraction, by estimation, is 65 to 70%. The left ventricle has normal function. The left ventricle has no regional  wall motion abnormalities. There is severe asymmetric left ventricular hypertrophy of the basal-septal segment. Left ventricular diastolic function could not be evaluated.   Pressure injury of skin - Skin care protocol  [place]ioid dependence ([place] - Resume home pain management medication  Mood disorder ([place] - Resuming home behavioral health medications  GERD (gastroesophageal reflux disease) - Protonix for GI protection  Constipation-opioid induced - Patient takes movantik 12.5 mg for this - CT scan shows large amount of stool in the rectal vault - Enema ordered 10/10/2021  Hypotensive episode - This is a chronic issue for the patient and she is on midodrine 10 mg 3 times daily for this  Generalized weakness - Anticipate improvement with resuscitation and plan returning to SNF when medically stabilized  History of cerebrovascular accident (CVA) with residual deficit - Resume home atorvastatin for secondary prevention - Temporarily holding apixaban until after HIDA scan  [place]ip[place] CV[place] unchanged  Stricture and stenosis of esophagus - Soft foods only  IDA (iron deficiency anemia) - Hemoglobin 11.9, patient no longer on iron supplement  Sedentary lifestyle - Patient currently resident of SNF   DVT prophylaxis: SCDs  Code Status: DNR   Family Communication:   Disposition Plan: return to SNF   Consults called: surgery   Admission status: INP  Level of care: Telemetry CIrwin BrakemanMD [facility] How to contact the [facility] HospitalAttending or Consulting provider 7A - 7P or covering provider during after hours 7P -7A, for this patient?  Check the care team in [facility]and look for a) attending/consulting [facility] provider listed and b) the  [facility] Centerteam listed Log into [email or web address] and use [place] universal password to access. If you do not have the password, please contact the hospital operator. Locate the [facility] Hospitalprovider you are looking for under [facility] and page to a number that you can be directly reached. If you still have difficulty reaching the provider, please page the [facility](Director on Call) for the Hospitalists listed on amion for assistance.   If 7PM-7AM, please contact night-coverage [email or web address] Password [facility] 10/10/2021, 4:26 PM

## 2021-10-10 NOTE — Progress Notes (Signed)
Pharmacy Antibiotic Note  [name] is a 76 y.o. female admitted on 10/10/2021 with sepsis.  Pharmacy has been consulted for Merrem and Vancomycin dosing. Recent urosepsis 09/13/21 with ESBL E. Coli in urine  Plan: Merrem 1gm IV q8h Vancomycin 1250 mg IV  loading dose then '1000mg'$   IV Q 24 hrs. Goal AUC 400-550. Expected AUC: 497 SCr used: 0.84  F/U cxs and clinical progress Monitor V/S, labs and levels as indicated  Height: '5\' 7"'$  (170.2 cm) Weight: 57.2 kg (126 lb) IBW/kg (Calculated) : 61.6  Temp (24hrs), Avg:98.3 F (36.8 C), Min:98.3 F (36.8 C), Max:98.3 F (36.8 C)  Recent Labs  Lab 10/10/21 1028  WBC 9.4  LATICACIDVEN 2.6*    Estimated Creatinine Clearance: 51.4 mL/min (by C-G formula based on SCr of 0.84 mg/dL).    Allergies  Allergen Reactions   Sulfa Antibiotics Rash    Antimicrobials this admission: merrem 9/18 >>  Vancomycin 9/18 >>   Microbiology results: 9/18 BCx: pending 9/18 UCx: pending   MRSA PCR:   Thank you for allowing pharmacy to be a part of this patient's care.  Isac Sarna, BS Pharm D, BCPS Clinical Pharmacist  10/10/2021 11:09 AM

## 2021-10-10 NOTE — ED Provider Notes (Signed)
[facility] EMERGENCY DEPARTMENT Provider Note   CSN: [id number] Arrival date & time: 10/10/21  0957     History  Chief Complaint  Patient presents with   Hypotension    [name] is a 76 y.o. female. With pmh T2DM, HTN, DVT, anemia, schizophrenia, CVA, GERD, atrial fibrillation, arthritis, anxiety, depression, recent admission for septic shock secondary to urosepsis and intermittent now presenting with hypotension.  Patient has no obvious documented history of dementia but she is not the best historian on exam.  She tells me she came from her doctor's office although she is known to be at the [facility] rehab facility now and was sent here for low blood pressure.  She says she has been eating or drinking but endorses nausea and some lower abdominal pain.  She denies any chest pain or shortness of breath.  She denies any bleeding, no melena or hematochezia.  She is unsure what medication she is taken today.  HPI     Home Medications Prior to Admission medications   Medication Sig Start Date End Date Taking? Authorizing Provider  ABILIFY 2 MG tablet Take 1 tablet (2 mg total) by mouth daily. 11/09/20  Yes [name], MD  acetaminophen (TYLENOL) 325 MG tablet Take 650 mg by mouth 3 (three) times daily as needed for mild pain or moderate pain.    Yes [provider]  albuterol (VENTOLIN HFA) 108 (90 Base) MCG/ACT inhaler Inhale 2 puffs into the lungs every 6 (six) hours as needed for wheezing or shortness of breath. 05/20/21  Yes Barton Dubois, MD  amiodarone (PACERONE) 200 MG tablet Take 1 tablet (200 mg total) by mouth daily. 04/24/21  Yes Barton Dubois, MD  apixaban (ELIQUIS) 5 MG TABS tablet Take 1 tablet (5 mg total) by mouth 2 (two) times daily. 02/21/20  Yes Richarda Osmond, MD  atorvastatin (LIPITOR) 40 MG tablet Take 1 tablet by mouth at bedtime. 05/30/20  Yes [provider]  busPIRone (BUSPAR) 7.5 MG tablet Take 7.5 mg by mouth 2 (two) times daily.  08/31/21  Yes [provider]  cholecalciferol (VITAMIN D3) 25 MCG (1000 UNIT) tablet Take 2,000 Units by mouth daily.   Yes [provider]  clonazePAM (KLONOPIN) 0.5 MG tablet Take 1 tablet (0.5 mg total) by mouth 3 (three) times daily as needed for anxiety. Patient taking differently: Take 0.5 mg by mouth 3 (three) times daily. 05/20/21  Yes Barton Dubois, MD  DULoxetine (CYMBALTA) 60 MG capsule Take 1 capsule by mouth daily. 08/26/21  Yes [provider]  GOODSENSE ARTIFICIAL TEARS 0.5-0.6 % SOLN Apply 1 drop to eye 3 (three) times daily. 06/03/21  Yes [provider]  HYDROcodone-acetaminophen (NORCO) 10-325 MG tablet Take 1 tablet by mouth every 8 (eight) hours as needed for severe pain. Patient taking differently: Take 1 tablet by mouth in the morning, at noon, and at bedtime. 05/20/21  Yes Barton Dubois, MD  insulin detemir (LEVEMIR FLEXTOUCH) 100 UNIT/ML FlexPen INJECT 22 UNITS SUBCUTANEOUSLY AT BEDTIME.(HOLD IF BS<70: CALL MD IF BS> 400) Patient taking differently: 22 Units at bedtime. 07/15/21  Yes Reardon, Juanetta Beets, NP  latanoprost (XALATAN) 0.005 % ophthalmic solution Place 1 drop into both eyes at bedtime.   Yes [provider]  loratadine (CLARITIN) 10 MG tablet Take 10 mg by mouth daily.   Yes [provider]  lubiprostone (AMITIZA) 8 MCG capsule Take 1 capsule by mouth daily. 08/10/21  Yes [provider]  metoCLOPramide (REGLAN) 5 MG tablet  Take 5 mg by mouth daily as needed for nausea or vomiting.   Yes [provider]  metoprolol tartrate (LOPRESSOR) 25 MG tablet Take 12.5 mg by mouth 2 (two) times daily.   Yes [provider]  midodrine (PROAMATINE) 10 MG tablet Take 10 mg by mouth 3 (three) times daily.   Yes [provider]  mirtazapine (REMERON) 30 MG tablet Take 30 mg by mouth at bedtime.   Yes [provider]  Multiple Vitamin (MULTIVITAMIN) tablet Take 1 tablet by mouth daily.    Yes [provider]  naloxegol oxalate (MOVANTIK) 12.5 MG TABS tablet Take 1 tablet (12.5 mg total) by mouth daily. 04/24/21  Yes Barton Dubois, MD  NOVOLOG FLEXPEN 100 UNIT/ML FlexPen INJECT SUBCUTANEOUSLY AS FOLLOWS WITH MEALS: 90-150=10u: 151-200=11u: 201-250=12u: 251-300=13u: 301-350=14u: 351-400=15u: BS>400=16u & CALL MD. Patient taking differently: 10-16 Units 3 (three) times daily with meals. INJECT SUBCUTANEOUSLY AS FOLLOWS WITH MEALS: 90-150=10u: 151-200=11u: 201-250=12u: 251-300=13u: 301-350=14u: 351-400=15u: BS>400=16u & CALL MD. 02/28/21  Yes Brita Romp, NP  omeprazole (PRILOSEC) 40 MG capsule Take 40 mg by mouth daily.   Yes [provider]  ondansetron (ZOFRAN) 4 MG tablet Take 4 mg by mouth 2 (two) times daily. 06/24/21  Yes [provider]  oxybutynin (DITROPAN-XL) 5 MG 24 hr tablet Take 5 mg by mouth daily. 04/20/21  Yes [provider]  pantoprazole (PROTONIX) 40 MG tablet Take 1 tablet (40 mg total) by mouth 2 (two) times daily before a meal. 11/17/20  Yes Mahala Menghini, PA-C  polyethylene glycol (MIRALAX / GLYCOLAX) 17 g packet Take 17 g by mouth daily. 04/24/21  Yes Barton Dubois, MD  risperiDONE (RISPERDAL) 1 MG tablet Take 1 mg by mouth 2 (two) times daily. Take one tablet at 900am and 1 tablet at 5pm and '2mg'$  at bedtime   Yes [provider]  risperiDONE (RISPERDAL) 2 MG tablet Take 2 mg by mouth at bedtime. Take '2mg'$  at bedtime and '1mg'$  at 9am and 5p daily 04/04/21  Yes [provider]  SANTYL 250 UNIT/GM ointment Apply 1 Application topically daily. 07/05/21  Yes [provider]  senna (SENOKOT) 8.6 MG tablet Take 1 tablet by mouth daily.   Yes [provider]  Skin Protectants, Misc. (BAZA PROTECT EX) Apply 1 Application topically in the morning and at bedtime. 08/02/21  Yes [provider]  TRADJENTA 5 MG TABS tablet TAKE (1) TABLET BY MOUTH ONCE DAILY. Patient taking differently: Take 5 mg by  mouth daily. 04/04/21  Yes Brita Romp, NP  calcium carbonate (TUMS - DOSED IN MG ELEMENTAL CALCIUM) 500 MG chewable tablet Chew 1 tablet by mouth 4 (four) times daily as needed for heartburn.    [provider]  clonazePAM (KLONOPIN) 0.5 MG tablet Take 1 tablet (0.5 mg total) by mouth 3 (three) times daily as needed for up to 5 days for anxiety. 09/22/21 09/27/21  Wyvonnia Dusky, MD  EASYMAX TEST test strip USE TO CHECK BLOOD SUGAR FOUR TIMES DAILY.(HOLD IF BS<70: CALL MD IF BS> 400) 07/21/21   Brita Romp, NP  Lancets Thin MISC USE TO CHECK BLOOD SUGAR FOUR TIMES DAILY.(HOLD IF BS<70: CALL MD IF BS> 400) 09/13/21   Brita Romp, NP  metoprolol succinate (TOPROL-XL) 25 MG 24 hr tablet Take 5 tablets (125 mg total) by mouth daily. Patient not taking: Reported on 10/10/2021 09/19/21   Orson Eva, MD      Allergies    Sulfa antibiotics  Review of Systems   Review of Systems  Physical Exam Updated Vital Signs BP 117/73   Pulse 95   Temp 98.2 F (36.8 C) (Oral)   Resp 16   Ht '5\' 7"'$  (1.702 m)   Wt 57.2 kg   SpO2 99%   BMI 19.73 kg/m  Physical Exam Constitutional: Mildly encephalopathic but alert and following commands, eyes open, no acute distress  eyes: Conjunctivae are normal. ENT      Head: Normocephalic and atraumatic.      Nose: No congestion.      Mouth/Throat: Mucous membranes are dry.      Neck: No stridor. Cardiovascular: S1, S2, extremities are warm and dry, equal palpable femoral pulses  respiratory: Normal respiratory effort. Breath sounds are normal.  O2 sat 99 on RA Gastrointestinal: Soft and nondistended with diffuse tenderness on palpation, worse in the epigastrium and right upper quadrant and suprapubic region, no rebound or guarding  musculoskeletal: Moving extremities x4 Neurologic: No facial droop, intermittently moving extremities x4, sensation grossly intact Skin: Skin is warm, dry and intact. No rash noted. Psychiatric: Mood and  affect are normal. Speech and behavior are normal.  ED Results / Procedures / Treatments   Labs (all labs ordered are listed, but only abnormal results are displayed) Labs Reviewed  COMPREHENSIVE METABOLIC PANEL - Abnormal; Notable for the following components:      Result Value   Glucose, Bld 210 (*)    Calcium 8.6 (*)    Albumin 3.4 (*)    AST 13 (*)    All other components within normal limits  CBC WITH DIFFERENTIAL/PLATELET - Abnormal; Notable for the following components:   Hemoglobin 11.9 (*)    All other components within normal limits  LACTIC ACID, PLASMA - Abnormal; Notable for the following components:   Lactic Acid, Venous 2.6 (*)    All other components within normal limits  LACTIC ACID, PLASMA - Abnormal; Notable for the following components:   Lactic Acid, Venous 2.1 (*)    All other components within normal limits  PROTIME-INR - Abnormal; Notable for the following components:   Prothrombin Time 17.8 (*)    INR 1.5 (*)    All other components within normal limits  URINALYSIS, ROUTINE W REFLEX MICROSCOPIC - Abnormal; Notable for the following components:   Leukocytes,Ua SMALL (*)    Bacteria, UA RARE (*)    Non Squamous Epithelial 0-5 (*)    All other components within normal limits  BLOOD GAS, VENOUS - Abnormal; Notable for the following components:   pO2, Ven <31 (*)    Bicarbonate 31.9 (*)    Acid-Base Excess 5.6 (*)    All other components within normal limits  T4, FREE - Abnormal; Notable for the following components:   Free T4 1.45 (*)    All other components within normal limits  CBC - Abnormal; Notable for the following components:   Hemoglobin 10.8 (*)    HCT 33.9 (*)    All other components within normal limits  CBG MONITORING, ED - Abnormal; Notable for the following components:   Glucose-Capillary 110 (*)    All other components within normal limits  POC OCCULT BLOOD, ED - Normal  RESP PANEL BY RT-PCR (FLU A&B, COVID) [id number]  CULTURE, BLOOD  (ROUTINE X 2)  CULTURE, BLOOD (ROUTINE X 2)  URINE CULTURE  APTT  TSH  CREATININE, SERUM  LACTIC ACID, PLASMA  COMPREHENSIVE METABOLIC PANEL  MAGNESIUM  CBC WITH DIFFERENTIAL/PLATELET  TYPE AND SCREEN  TROPONIN I (HIGH SENSITIVITY)  TROPONIN I (HIGH SENSITIVITY)    EKG EKG Interpretation  Date/Time:  Monday October 10 2021 10:23:47 EDT Ventricular Rate:  81 PR Interval:    QRS Duration: 98 QT Interval:  465 QTC Calculation: 506 R Axis:   0 Text Interpretation: Atrial fibrillation Indeterminate axis Inferior infarct, age indeterminate Consider anterior infarct Prolonged QT interval No acute changes Confirmed by Georgina Snell [phone]) on 10/10/2021 10:26:16 AM  Radiology CT Angio Chest/Abd/Pel for Dissection W and/or Wo Contrast  Result Date: 10/10/2021 CLINICAL DATA:  Increased weakness, hypotension and bradycardia. EXAM: CT ANGIOGRAPHY CHEST, ABDOMEN AND PELVIS TECHNIQUE: Non-contrast CT of the chest was initially obtained. Multidetector CT imaging through the chest, abdomen and pelvis was performed using the standard protocol during bolus administration of intravenous contrast. Multiplanar reconstructed images and MIPs were obtained and reviewed to evaluate the vascular anatomy. RADIATION DOSE REDUCTION: This exam was performed according to the departmental dose-optimization program which includes automated exposure control, adjustment of the mA and/or kV according to patient size and/or use of iterative reconstruction technique. CONTRAST:  83m OMNIPAQUE IOHEXOL 350 MG/ML SOLN COMPARISON:  CT abdomen pelvis, 07/14/2021. Current chest radiograph. Chest CTA, 05/18/2021. FINDINGS: CTA CHEST FINDINGS Cardiovascular: Thoracic aorta is normal in caliber. No dissection. Mild atherosclerosis. No significant stenosis. Arch branch vessels are widely patent. Heart mildly enlarged. Three-vessel coronary artery calcifications. No pericardial effusion. Main pulmonary artery is mildly dilated to  3.5 cm. Mediastinum/Nodes: Small thyroid nodules up to 1.3 cm. No follow-up recommended. No neck base, mediastinal or hilar masses. Several subcentimeter but prominent mediastinal lymph nodes. Mildly enlarged subcarinal lymph node, 1.5 cm in short axis. No hilar adenopathy. Normal trachea. Esophagus mildly distended. Small to moderate-sized hiatal hernia. Lungs/Pleura: Mild peripheral interstitial thickening and areas of mild reticular scarring. Minor dependent subsegmental atelectasis. Small calcified granuloma in the right lower lobe. No evidence of pneumonia or pulmonary edema. No mass or suspicious nodule. No pleural effusion or pneumothorax. Musculoskeletal: No fracture or acute finding. No bone lesion. Skeletal structures are demineralized. No chest wall mass. Review of the MIP images confirms the above findings. CTA ABDOMEN AND PELVIS FINDINGS VASCULAR Aorta: Aorta normal in caliber. Diffuse atherosclerosis with no significant stenosis. No dissection. Celiac: Patent without evidence of aneurysm, dissection, vasculitis or significant stenosis. SMA: Patent without evidence of aneurysm, dissection, vasculitis or significant stenosis. Renals: Both renal arteries are patent without evidence of aneurysm, dissection, vasculitis, fibromuscular dysplasia or significant stenosis. IMA: Patent without evidence of aneurysm, dissection, vasculitis or significant stenosis. Inflow: Patent without evidence of aneurysm, dissection, vasculitis or significant stenosis. Veins: No obvious venous abnormality within the limitations of this arterial phase study. Review of the MIP images confirms the above findings. NON-VASCULAR Hepatobiliary: Liver normal in size. 3.4 cm right lobe hemangioma, stable. No other liver masses or lesions. Gallbladder is distended. There are scattered wall calcifications. Small dependent gallstone with another stone projecting in the cystic duct. No pericholecystic inflammation. Common bile duct measures  up to 9 mm. No common bile duct stone. Pancreas: Unremarkable. No pancreatic ductal dilatation or surrounding inflammatory changes. Spleen: Normal in size without focal abnormality. Adrenals/Urinary Tract: No adrenal nodule. Kidneys normal in size, orientation and position. Mild dilation of the intrarenal collecting systems and proximal to mid ureters, relatively symmetric. No ureteral stones. Bladder moderately distended, otherwise unremarkable. Stomach/Bowel: Rectum significantly distended with stool, measuring 8.5 cm in diameter. Colon is also distended with stool, which is moderate to markedly increased. Small bowel is normal in caliber. There is  no bowel wall thickening or inflammatory changes. Small to moderate hiatal hernia. Stomach otherwise unremarkable. Lymphatic: Prominent gastrohepatic ligament lymph nodes, up to 1 cm in short axis. These are stable. No other enlarged lymph nodes. Reproductive: Uterus and bilateral adnexa are unremarkable. Other: No abdominal wall hernia or abnormality. No abdominopelvic ascites. Musculoskeletal: Next lucent and sclerotic area in the S1 vertebra that is stable consistent with a benign lesion. No fracture or acute finding. No suspicious bone lesion. Review of the MIP images confirms the above findings. IMPRESSION: 1. Normal caliber thoracoabdominal aorta. No dissection. No evidence of acute aortic syndrome. 2. Aortic atherosclerosis no significant stenosis. Aortic branch vessels are all widely patent. 3. Distended gallbladder with scattered wall calcification, small dependent stone and small stone in the cystic duct, but no adjacent inflammation. Findings are similar to the prior CT. Consider early acute cholecystitis if there are consistent clinical findings. 4. Small to moderate hiatal hernia. 5. Rectum significantly distended with stool. Moderate to marked increase in colonic stool. No bowel obstruction or inflammation. Electronically Signed   By: Lajean Manes M.D.    On: 10/10/2021 14:07   CT Head Wo Contrast  Result Date: 10/10/2021 CLINICAL DATA:  Altered mental status, hypotension, bradycardia EXAM: CT HEAD WITHOUT CONTRAST TECHNIQUE: Contiguous axial images were obtained from the base of the skull through the vertex without intravenous contrast. RADIATION DOSE REDUCTION: This exam was performed according to the departmental dose-optimization program which includes automated exposure control, adjustment of the mA and/or kV according to patient size and/or use of iterative reconstruction technique. COMPARISON:  09/21/2021 FINDINGS: Brain: No acute intracranial findings are seen. There are no signs of bleeding within the cranium. Cortical sulci are prominent. There is decreased density in periventricular white matter. Vascular: Scattered arterial calcifications are seen. Skull: There is mild hyperostosis frontalis interna. Sinuses/Orbits: Defects in the medial walls of maxillary sinuses may be postsurgical. There are no air-fluid levels in the visualized paranasal sinuses. Other: None. IMPRESSION: No acute intracranial findings are seen in noncontrast CT brain. Atrophy. Small vessel disease. Electronically Signed   By: Elmer Picker M.D.   On: 10/10/2021 13:54   DG Chest Port 1 View  Result Date: 10/10/2021 CLINICAL DATA:  Possible sepsis EXAM: PORTABLE CHEST 1 VIEW COMPARISON:  Previous studies including the examination of 09/21/2021 FINDINGS: Transverse diameter of heart is slightly increased. There are no signs of pulmonary edema or focal pulmonary consolidation. Left lateral CP angle is indistinct with no interval change. There is no pneumothorax. Degenerative changes are noted in both shoulders. IMPRESSION: There are no new infiltrates or signs of pulmonary edema. Electronically Signed   By: Elmer Picker M.D.   On: 10/10/2021 10:52    Procedures .Critical Care  Performed by: Elgie Congo, MD Authorized by: Elgie Congo, MD    Critical care provider statement:    Critical care time (minutes):  35   Critical care was necessary to treat or prevent imminent or life-threatening deterioration of the following conditions:  Shock and sepsis   Critical care was time spent personally by me on the following activities:  Development of treatment plan with patient or surrogate, discussions with consultants, evaluation of patient's response to treatment, examination of patient, ordering and review of laboratory studies, ordering and review of radiographic studies, ordering and performing treatments and interventions, pulse oximetry, re-evaluation of patient's condition, review of old charts and obtaining history from patient or surrogate   Care discussed with: admitting provider  Medications Ordered in ED Medications  meropenem (MERREM) 1 g in sodium chloride 0.9 % 100 mL IVPB (0 g Intravenous Stopped 10/10/21 1303)  vancomycin (VANCOCIN) IVPB 1000 mg/200 mL premix (has no administration in time range)  lactated ringers infusion ( Intravenous New Bag/Given 10/10/21 1740)  HYDROcodone-acetaminophen (NORCO) 10-325 MG per tablet 1 tablet (1 tablet Oral Given 10/10/21 1740)  amiodarone (PACERONE) tablet 200 mg (200 mg Oral Given 10/10/21 1727)  atorvastatin (LIPITOR) tablet 40 mg (has no administration in time range)  metoprolol tartrate (LOPRESSOR) tablet 12.5 mg (has no administration in time range)  midodrine (PROAMATINE) tablet 10 mg (10 mg Oral Given 10/10/21 1727)  busPIRone (BUSPAR) tablet 7.5 mg (has no administration in time range)  DULoxetine (CYMBALTA) DR capsule 60 mg (60 mg Oral Given 10/10/21 1727)  risperiDONE (RISPERDAL) tablet 1 mg (has no administration in time range)  risperiDONE (RISPERDAL) tablet 2 mg (has no administration in time range)  insulin detemir (LEVEMIR) injection 12 Units (has no administration in time range)  linagliptin (TRADJENTA) tablet 5 mg (has no administration in time range)  lubiprostone  (AMITIZA) capsule 8 mcg (has no administration in time range)  pantoprazole (PROTONIX) EC tablet 40 mg (40 mg Oral Given 10/10/21 1727)  polyethylene glycol (MIRALAX / GLYCOLAX) packet 17 g (17 g Oral Given 10/10/21 1728)  oxybutynin (DITROPAN-XL) 24 hr tablet 5 mg (5 mg Oral Given 10/10/21 1727)  clonazePAM (KLONOPIN) tablet 0.5 mg (has no administration in time range)  latanoprost (XALATAN) 0.005 % ophthalmic solution 1 drop (has no administration in time range)  ARIPiprazole (ABILIFY) tablet 2 mg (has no administration in time range)  insulin aspart (novoLOG) injection 0-9 Units ( Subcutaneous Not Given 10/10/21 1740)  insulin aspart (novoLOG) injection 3 Units (3 Units Subcutaneous Not Given 10/10/21 1740)  heparin injection 5,000 Units (has no administration in time range)  acetaminophen (TYLENOL) tablet 650 mg (has no administration in time range)    Or  acetaminophen (TYLENOL) suppository 650 mg (has no administration in time range)  sorbitol, milk of mag, mineral oil, glycerin (SMOG) enema (0 mLs Rectal Hold 10/10/21 1728)  fentaNYL (SUBLIMAZE) injection 25 mcg (has no administration in time range)  prochlorperazine (COMPAZINE) injection 10 mg (has no administration in time range)  ipratropium-albuterol (DUONEB) 0.5-2.5 (3) MG/3ML nebulizer solution 3 mL (has no administration in time range)  polyvinyl alcohol (LIQUIFILM TEARS) 1.4 % ophthalmic solution 1 drop (has no administration in time range)  lactated ringers bolus 1,000 mL (0 mLs Intravenous Stopped 10/10/21 1146)    And  lactated ringers bolus 500 mL (0 mLs Intravenous Stopped 10/10/21 1303)    And  lactated ringers bolus 250 mL (0 mLs Intravenous Stopped 10/10/21 1303)  vancomycin (VANCOREADY) IVPB 1250 mg/250 mL (0 mg Intravenous Stopped 10/10/21 1448)  fentaNYL (SUBLIMAZE) injection 25 mcg (25 mcg Intravenous Given 10/10/21 1311)  iohexol (OMNIPAQUE) 300 MG/ML solution 100 mL (100 mLs Intravenous Contrast Given 10/10/21 1319)   iohexol (OMNIPAQUE) 350 MG/ML injection 100 mL (75 mLs Intravenous Contrast Given 10/10/21 1333)    ED Course/ Medical Decision Making/ A&P Clinical Course as of 10/10/21 1857  Mon Oct 10, 2021  1043 Waited on hold with patient's nursing facility at [facility] for approximately 15 minutes trying to get more information from patient's nursing staff, unable to get information or any nursing staff to answer [VB]  1122 Personal interpretation of chest x-ray shows no Consolidation concerning for pneumonia, no pulmonary edema. [VB]  1122 Labs with  normal white blood cell count 9.4 no left shift, hemoglobin 11.9 improved from baseline.  Elevated lactate 2.6.  Creatinine 0.97 within normal limits.  Glucose 210. [VB]  7017 [id number] have normalized and improved significantly with IV fluids. [VB]  2330 Patient's case discussed with Dr. Constance Haw who recommends HIDA scan and will formally consult in the hospital regarding possible cholecystitis on CT scan.  I discussed case with hospitalist will put in orders for continued observation and monitoring and treatment of possible sepsis. [VB]    Clinical Course User Index [VB] Elgie Congo, MD                           Medical Decision Making ARADHYA SHELLENBARGER is a 76 y.o. female. With pmh T2DM, HTN, DVT, anemia, schizophrenia, CVA, GERD, atrial fibrillation, arthritis, anxiety, depression, recent admission for septic shock secondary to urosepsis and intermittent now presenting with hypotension.  Patient presents hypotensive 68/41 with bradycardia 55 and atrial fibrillation with no increased work of breathing and no hypoxia satting 96% on room air.  She is awake alert and following commands and slightly encephalopathic.  There is no evidence of injury on exam.  She is afebrile temperature is 36.8 C rectally.  With her recent admission for septic shock secondary to urosepsis, consider repeat septic shock and possible  underlying UTI or pneumonia as she is actively coughing on exam or possible intra-abdominal pathology with abdominal tenderness and previous CT imaging suggestive of possible cholecystitis.  Would also consider generalized hypovolemic shock as she appears to be dry on exam.  Unlikely bleeding with negative Hemoccult and hemoglobin increased from baseline.  Unlikely cardiogenic with no acute ischemic changes on EKG and extremities warm and dry with previous echocardiogram and patient with no evidence of severe heart failure.  Will obtain sepsis labs and work-up with imaging including CTA dissection scan, chest x-ray, UA.  We will treat patient with IV sepsis fluids and antibiotics.Plan for admission,    Amount and/or Complexity of Data Reviewed Labs: ordered. Radiology: ordered.  Risk Prescription drug management. Decision regarding hospitalization.    Final Clinical Impression(s) / ED Diagnoses Final diagnoses:  Hypotension, unspecified hypotension type  Calculus of gallbladder without cholecystitis without obstruction    Rx / DC Orders ED Discharge Orders     None         Elgie Congo, MD 10/10/21 1857

## 2021-10-10 NOTE — Assessment & Plan Note (Signed)
-   Hemoglobin 11.9, patient no longer on iron supplement

## 2021-10-10 NOTE — Assessment & Plan Note (Signed)
-   Anticipate improvement with resuscitation and plan returning to SNF when medically stabilized

## 2021-10-10 NOTE — Assessment & Plan Note (Signed)
-   Resume home pain management medication

## 2021-10-10 NOTE — Assessment & Plan Note (Deleted)
-   Resume home amiodarone and metoprolol -Temporarily holding apixaban until after HIDA scan

## 2021-10-10 NOTE — Assessment & Plan Note (Addendum)
-   Patient takes movantik 12.5 mg for this - CT scan shows large amount of stool in the rectal vault - Enema ordered 10/10/2021

## 2021-10-10 NOTE — Telephone Encounter (Signed)
Patient presented for routine follow up. She was dropped off by driver from [facility]. Staff noticed patient was lethargic. Attempted to get blood pressure with automatic cuff, systolic in the 05L. BP manual 60/42, pulse 70. Could not obtain pulse ox reading on multiple attempts. Patient pale in appears, lethargic, falling asleep in the wheelchair. I advised her to be transported to the ER for evaluation. Ronalee Belts the driver for SNF states he would take patient immediately.   Office visit was cancelled.

## 2021-10-10 NOTE — Progress Notes (Signed)
[facility]  Recommend HIDA to rule out cholecystitis. Can see patient tomorrow regarding abdominal pain and gallbladder.  Curlene Labrum, MD [facility] [address] [place], [place] [address] [phone] (office)

## 2021-10-10 NOTE — Assessment & Plan Note (Signed)
-   Patient presented severely hypotensive with exam findings concerning for an intra-abdominal infection -Fortunately she responded to IV fluid hydration, fluid boluses and supportive measures -She has been started on broad-spectrum IV antibiotic therapy -Blood cultures x2 ordered and will be followed

## 2021-10-10 NOTE — Assessment & Plan Note (Signed)
-   Treating with aggressive IV fluid hydration and will follow lactate

## 2021-10-10 NOTE — Assessment & Plan Note (Signed)
-   Most recent echocardiogram 09/19/2021 Severe asymmetric hypertrophy of the basal septum up to 70m. There is evidence of SAM of the MV leaflets with obstruction up to 37 mmHG (variation noted due to afib). 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 Would  consider a cardiac MRI for better characterization. This was not well characterized on the last echocardiogram. Left ventricular ejection fraction, by estimation, is 65 to 70%. The left ventricle has normal function. The left ventricle has no regional  wall motion abnormalities. There is severe asymmetric left ventricular hypertrophy of the basal-septal segment. Left ventricular diastolic function could not be evaluated.

## 2021-10-10 NOTE — Assessment & Plan Note (Signed)
-   Resume home amiodarone and metoprolol -Temporarily holding apixaban until after HIDA scan

## 2021-10-10 NOTE — Assessment & Plan Note (Signed)
-   Sequela of prior CVA unchanged

## 2021-10-10 NOTE — Assessment & Plan Note (Signed)
-   Patient currently resident of SNF

## 2021-10-10 NOTE — Assessment & Plan Note (Signed)
-   Resume home atorvastatin for secondary prevention - resuming home apixaban

## 2021-10-10 NOTE — Assessment & Plan Note (Signed)
-   Skin care protocol

## 2021-10-10 NOTE — ED Notes (Signed)
[facility] unable to do scan due to pain medication administration. Patient needs to not have any pain meds 6 hours prior to exam. Patient also needs to be NPO.  Plan is for patient to be NPO and pain medication free after midnight. They plan to perform test on patient at 0630/0700 on 10/11/21

## 2021-10-10 NOTE — Assessment & Plan Note (Signed)
-   Continue DNR order while in hospital

## 2021-10-11 ENCOUNTER — Inpatient Hospital Stay (HOSPITAL_COMMUNITY): Payer: Medicare Other

## 2021-10-11 ENCOUNTER — Encounter (HOSPITAL_COMMUNITY): Payer: Self-pay | Admitting: Family Medicine

## 2021-10-11 DIAGNOSIS — R627 Adult failure to thrive: Secondary | ICD-10-CM | POA: Diagnosis not present

## 2021-10-11 DIAGNOSIS — R531 Weakness: Secondary | ICD-10-CM | POA: Diagnosis not present

## 2021-10-11 DIAGNOSIS — K5903 Drug induced constipation: Secondary | ICD-10-CM | POA: Diagnosis not present

## 2021-10-11 DIAGNOSIS — Z66 Do not resuscitate: Secondary | ICD-10-CM | POA: Diagnosis not present

## 2021-10-11 DIAGNOSIS — K8020 Calculus of gallbladder without cholecystitis without obstruction: Secondary | ICD-10-CM | POA: Diagnosis present

## 2021-10-11 LAB — CBC WITH DIFFERENTIAL/PLATELET
Abs Immature Granulocytes: 0.02 10*3/uL (ref 0.00–0.07)
Basophils Absolute: 0 10*3/uL (ref 0.0–0.1)
Basophils Relative: 0 %
Eosinophils Absolute: 0.2 10*3/uL (ref 0.0–0.5)
Eosinophils Relative: 3 %
HCT: 28.8 % — ABNORMAL LOW (ref 36.0–46.0)
Hemoglobin: 9.3 g/dL — ABNORMAL LOW (ref 12.0–15.0)
Immature Granulocytes: 0 %
Lymphocytes Relative: 27 %
Lymphs Abs: 1.9 10*3/uL (ref 0.7–4.0)
MCH: 27.8 pg (ref 26.0–34.0)
MCHC: 32.3 g/dL (ref 30.0–36.0)
MCV: 86 fL (ref 80.0–100.0)
Monocytes Absolute: 0.4 10*3/uL (ref 0.1–1.0)
Monocytes Relative: 6 %
Neutro Abs: 4.3 10*3/uL (ref 1.7–7.7)
Neutrophils Relative %: 64 %
Platelets: 197 10*3/uL (ref 150–400)
RBC: 3.35 MIL/uL — ABNORMAL LOW (ref 3.87–5.11)
RDW: 15.1 % (ref 11.5–15.5)
WBC: 6.9 10*3/uL (ref 4.0–10.5)
nRBC: 0 % (ref 0.0–0.2)

## 2021-10-11 LAB — COMPREHENSIVE METABOLIC PANEL
ALT: 13 U/L (ref 0–44)
AST: 29 U/L (ref 15–41)
Albumin: 2.3 g/dL — ABNORMAL LOW (ref 3.5–5.0)
Alkaline Phosphatase: 98 U/L (ref 38–126)
Anion gap: 5 (ref 5–15)
BUN: 9 mg/dL (ref 8–23)
CO2: 26 mmol/L (ref 22–32)
Calcium: 7.7 mg/dL — ABNORMAL LOW (ref 8.9–10.3)
Chloride: 107 mmol/L (ref 98–111)
Creatinine, Ser: 0.55 mg/dL (ref 0.44–1.00)
GFR, Estimated: >60 mL/min (ref >60)
Glucose, Bld: 91 mg/dL (ref 70–99)
Potassium: 3.2 mmol/L — ABNORMAL LOW (ref 3.5–5.1)
Sodium: 138 mmol/L (ref 135–145)
Total Bilirubin: 0.5 mg/dL (ref 0.3–1.2)
Total Protein: 5.1 g/dL — ABNORMAL LOW (ref 6.5–8.1)

## 2021-10-11 LAB — URINE CULTURE: Culture: NO GROWTH

## 2021-10-11 LAB — GLUCOSE, CAPILLARY
Glucose-Capillary: 125 mg/dL — ABNORMAL HIGH (ref 70–99)
Glucose-Capillary: 96 mg/dL (ref 70–99)
Glucose-Capillary: 173 mg/dL — ABNORMAL HIGH (ref 70–99)
Glucose-Capillary: 191 mg/dL — ABNORMAL HIGH (ref 70–99)
Glucose-Capillary: 143 mg/dL — ABNORMAL HIGH (ref 70–99)
Glucose-Capillary: 163 mg/dL — ABNORMAL HIGH (ref 70–99)

## 2021-10-11 LAB — PROCALCITONIN: Procalcitonin: <0.1 ng/mL

## 2021-10-11 LAB — MAGNESIUM: Magnesium: 1.7 mg/dL (ref 1.7–2.4)

## 2021-10-11 MED ORDER — TECHNETIUM TC 99M MEBROFENIN IV KIT
5.0000 | PACK | Freq: Once | INTRAVENOUS | Status: AC | PRN
  Administered 2021-10-11: 5.5 via INTRAVENOUS

## 2021-10-11 MED ORDER — MILK AND MOLASSES ENEMA
1.0000 | Freq: Once | RECTAL | Status: AC
  Administered 2021-10-11: 240 mL via RECTAL

## 2021-10-11 MED ORDER — DOXYCYCLINE HYCLATE 100 MG PO TABS
100.0000 mg | ORAL_TABLET | Freq: Two times a day (BID) | ORAL | Status: DC
  Administered 2021-10-11 – 2021-10-13 (×4): 100 mg via ORAL
  Filled 2021-10-11 (×4): qty 1

## 2021-10-11 MED ORDER — SODIUM CHLORIDE 0.9 % IV BOLUS
500.0000 mL | Freq: Once | INTRAVENOUS | Status: AC
  Administered 2021-10-11: 500 mL via INTRAVENOUS

## 2021-10-11 MED ORDER — SORBITOL 70 % SOLN
960.0000 mL | TOPICAL_OIL | Freq: Once | ORAL | Status: DC
  Filled 2021-10-11: qty 240

## 2021-10-11 MED ORDER — APIXABAN 5 MG PO TABS
5.0000 mg | ORAL_TABLET | Freq: Two times a day (BID) | ORAL | Status: DC
  Administered 2021-10-11 – 2021-10-13 (×5): 5 mg via ORAL
  Filled 2021-10-11 (×5): qty 1

## 2021-10-11 MED ORDER — GUAIFENESIN ER 600 MG PO TB12
600.0000 mg | ORAL_TABLET | Freq: Two times a day (BID) | ORAL | Status: DC
  Administered 2021-10-11 – 2021-10-13 (×4): 600 mg via ORAL
  Filled 2021-10-11 (×5): qty 1

## 2021-10-11 MED ORDER — POTASSIUM CHLORIDE 20 MEQ PO PACK
40.0000 meq | PACK | Freq: Once | ORAL | Status: AC
  Administered 2021-10-11: 40 meq via ORAL
  Filled 2021-10-11: qty 2

## 2021-10-11 MED ORDER — DEXTROMETHORPHAN POLISTIREX ER 30 MG/5ML PO SUER: 30.0000 mg | Freq: Two times a day (BID) | ORAL | Status: DC | PRN

## 2021-10-11 MED ORDER — CALCIUM CARBONATE ANTACID 500 MG PO CHEW
1.0000 | CHEWABLE_TABLET | Freq: Two times a day (BID) | ORAL | Status: DC | PRN
  Administered 2021-10-11: 200 mg via ORAL
  Filled 2021-10-11 (×2): qty 1

## 2021-10-11 NOTE — Progress Notes (Signed)
Milk and molasses enema given per MD orders. Patient tolerated procedure, will continue plan of care.Marland Kitchen

## 2021-10-11 NOTE — Progress Notes (Signed)
   10/11/21 0356  Vitals  BP (!) 79/42  BP Location Right Arm  BP Method Manual  Patient Position (if appropriate) Lying  MEWS COLOR  MEWS Score Color Yellow  MEWS Score  MEWS Temp 0  MEWS Systolic 2  MEWS Pulse 0  MEWS RR 0  MEWS LOC 0  MEWS Score 2   Pt asymptomatic. Dr. Orlin Hilding notified, new order for NS 555m bolus. Will recheck BP when complete

## 2021-10-11 NOTE — Consult Note (Addendum)
[facility] Consult  Reason for Consult: RUQ pain gallstones Referring Physician: ED/ Dr. Wynetta Emery   Chief Complaint   Hypotension     HPI: [name] is a 76 y.o. female with multiple medical issues and multiple recent admissions to the hospital and visits to the ED for food impaction, fecal impaction, and  weakness/ sepsis. She was being seen by GI yesterday when she was sent to the ED for a BP 70s with concern for sepsis.  She was worked up in the ED and noted to have some fecal impaction with a stool bag distending rectum to 8.5 and gallstones. She had normal LFTs. She did have some pain in the abdominal region and pain the RUQ and the ED provider called me to discuss the case. Given the patient's recent admissions and issues, I recommended HIDA can to determine if any cholecystitis was present.   HIDA was done this AM and was negative for cholecystitis. She still having pain but it is pretty generalized in the upper abdomen and lower abdomen. She does point more to the right side but she is laying on her left side currently.   She had an EGD 7/1 that showed some food impacted in the esophagus and dilation with plans for repeat in 4-6 weeks. This has not been repeated to date.   Past Medical History:  Diagnosis Date   Anxiety    Arthritis    Atrial fibrillation (HCC)    Atrial flutter (HCC)    CHF (congestive heart failure) (HCC)    Collagen vascular disease ([place])    COVID-19 virus infection    COVID-19+ approx 01/24/19; asymptomatic course with full recovery   Dependence on wheelchair    pivot/transfers   Depression    History of psychosis and previous suicide attempt   DVT, lower extremity, recurrent ([place])    Long-term Coumadin per Dr. Legrand Rams   Essential hypertension    GERD (gastroesophageal reflux disease)    Hemiplegia ([place]) 2010   Left side   History of stroke    Acute infarct and right cerebral white matter small vessel disease 12/10    Hyperlipidemia    Hypotension, unspecified    IDA (iron deficiency anemia)    Irritable bowel syndrome without diarrhea    Leg DVT (deep venous thromboembolism), acute ([place]) 2006   Obstructive hypertrophic cardiomyopathy (HCC)    Orthostatic hypotension    Schizophrenia ([place])    Stroke ([place])    left sided weakness   Type 2 diabetes mellitus ([place])    Vitamin D deficiency     Past Surgical History:  Procedure Laterality Date   BACK SURGERY     BIOPSY N/A 11/24/2014   Procedure: BIOPSY;  Surgeon: Danie Binder, MD;  Location: [facility];  Service: Endoscopy;  Laterality: N/A;   BIOPSY  05/17/2016   Procedure: BIOPSY;  Surgeon: Danie Binder, MD;  Location: [facility];  Service: Endoscopy;;  gastric biopsy   COLONOSCOPY WITH PROPOFOL N/A 11/24/2014   Dr. Rudie Meyer polyps removed/moderate sized internal hemorrhoids, tubular adenomas. Next surveillance in 3 years   ESOPHAGEAL DILATION N/A 07/23/2021   Procedure: ESOPHAGEAL DILATION;  Surgeon: Eloise Harman, DO;  Location: [facility];  Service: Endoscopy;  Laterality: N/A;   ESOPHAGOGASTRODUODENOSCOPY (EGD) WITH PROPOFOL N/A 11/24/2014   Dr. [place] Pert HH/patent stricture at the gastroesophageal junction, mild non-erosive gastritis, path negative for H.pylori or celiac sprue   ESOPHAGOGASTRODUODENOSCOPY (EGD) WITH PROPOFOL N/A 05/17/2016   Procedure: ESOPHAGOGASTRODUODENOSCOPY (EGD)  WITH PROPOFOL;  Surgeon: Danie Binder, MD;  [facility];  Service: Endoscopy;  Laterality: N/A;  12:45pm   ESOPHAGOGASTRODUODENOSCOPY (EGD) WITH PROPOFOL N/A 07/23/2021   Procedure: ESOPHAGOGASTRODUODENOSCOPY (EGD) WITH PROPOFOL;  Surgeon: Eloise Harman, DO;  [facility];  Service: Endoscopy;  Laterality: N/A;   GIVENS CAPSULE STUDY N/A 12/11/2014   MULTILPLE EROSION IN the stomach WITH ACTIVE OOZING. OCCASIONAL EROSIONS AND RARE ULCER SEEN IN PROXIMAL SMALL BOWEL . No masses or AVMs SEEN. NO OLD BLOOD OR FRESH BLOOD SEEN.     POLYPECTOMY N/A 11/24/2014   Procedure: POLYPECTOMY;  Surgeon: Danie Binder, MD;  Location: [facility];  Service: Endoscopy;  Laterality: N/A;   SAVORY DILATION N/A 05/17/2016   Procedure: SAVORY DILATION;  Surgeon: Danie Binder, MD;  Location[facility];  Service: Endoscopy;  Laterality: N/A;    Family History  Problem Relation Age of Onset   Hypertension Mother    Colon cancer Neg Hx     Social History   Tobacco Use   Smoking status: Former    Packs/day: 0.25    Years: 20.00    Total pack years: 5.00    Types: Cigarettes    Quit date: 01/24/1995    Years since quitting: 26.7   Smokeless tobacco: Never  Vaping Use   Vaping Use: Never used  Substance Use Topics   Alcohol use: No    Alcohol/week: 0.0 standard drinks of alcohol   Drug use: No    Medications: I have reviewed the patient's current medications. Prior to Admission:  Medications Prior to Admission  Medication Sig Dispense Refill Last Dose   ABILIFY 2 MG tablet Take 1 tablet (2 mg total) by mouth daily. 30 tablet 5 10/09/2021   acetaminophen (TYLENOL) 325 MG tablet Take 650 mg by mouth 3 (three) times daily as needed for mild pain or moderate pain.    unk   albuterol (VENTOLIN HFA) 108 (90 Base) MCG/ACT inhaler Inhale 2 puffs into the lungs every 6 (six) hours as needed for wheezing or shortness of breath. 8 g 1 unk   amiodarone (PACERONE) 200 MG tablet Take 1 tablet (200 mg total) by mouth daily.   10/09/2021   apixaban (ELIQUIS) 5 MG TABS tablet Take 1 tablet (5 mg total) by mouth 2 (two) times daily. 60 tablet 0 10/09/2021 at 2000   atorvastatin (LIPITOR) 40 MG tablet Take 1 tablet by mouth at bedtime.   10/09/2021   busPIRone (BUSPAR) 7.5 MG tablet Take 7.5 mg by mouth 2 (two) times daily.   10/09/2021   cholecalciferol (VITAMIN D3) 25 MCG (1000 UNIT) tablet Take 2,000 Units by mouth daily.   10/09/2021   clonazePAM (KLONOPIN) 0.5 MG tablet Take 1 tablet (0.5 mg total) by mouth 3 (three) times daily as needed for  anxiety. (Patient taking differently: Take 0.5 mg by mouth 3 (three) times daily.) 10 tablet 0 10/09/2021   DULoxetine (CYMBALTA) 60 MG capsule Take 1 capsule by mouth daily.   10/09/2021   GOODSENSE ARTIFICIAL TEARS 0.5-0.6 % SOLN Apply 1 drop to eye 3 (three) times daily.   10/09/2021   HYDROcodone-acetaminophen (NORCO) 10-325 MG tablet Take 1 tablet by mouth every 8 (eight) hours as needed for severe pain. (Patient taking differently: Take 1 tablet by mouth in the morning, at noon, and at bedtime.) 10 tablet 0 10/09/2021   insulin detemir (LEVEMIR FLEXTOUCH) 100 UNIT/ML FlexPen INJECT 22 UNITS SUBCUTANEOUSLY AT BEDTIME.(HOLD IF BS<70: CALL MD IF BS>  400) (Patient taking differently: 22 Units at bedtime.) 15 mL 2 10/09/2021   latanoprost (XALATAN) 0.005 % ophthalmic solution Place 1 drop into both eyes at bedtime.   10/09/2021   loratadine (CLARITIN) 10 MG tablet Take 10 mg by mouth daily.   10/09/2021   lubiprostone (AMITIZA) 8 MCG capsule Take 1 capsule by mouth daily.   10/09/2021   metoCLOPramide (REGLAN) 5 MG tablet Take 5 mg by mouth daily as needed for nausea or vomiting.   unk   metoprolol tartrate (LOPRESSOR) 25 MG tablet Take 12.5 mg by mouth 2 (two) times daily.   10/09/2021 at 0900   midodrine (PROAMATINE) 10 MG tablet Take 10 mg by mouth 3 (three) times daily.   10/09/2021   mirtazapine (REMERON) 30 MG tablet Take 30 mg by mouth at bedtime.   10/09/2021   Multiple Vitamin (MULTIVITAMIN) tablet Take 1 tablet by mouth daily.   10/09/2021   naloxegol oxalate (MOVANTIK) 12.5 MG TABS tablet Take 1 tablet (12.5 mg total) by mouth daily. 30 tablet 1 10/09/2021 at 01800   NOVOLOG FLEXPEN 100 UNIT/ML FlexPen INJECT SUBCUTANEOUSLY AS FOLLOWS WITH MEALS: 90-150=10u: 151-200=11u: 201-250=12u: 251-300=13u: 301-350=14u: 351-400=15u: BS>400=16u & CALL MD. (Patient taking differently: 10-16 Units 3 (three) times daily with meals. INJECT SUBCUTANEOUSLY AS FOLLOWS WITH MEALS: 90-150=10u: 151-200=11u: 201-250=12u:  251-300=13u: 301-350=14u: 351-400=15u: BS>400=16u & CALL MD.) 15 mL 0 10/09/2021   omeprazole (PRILOSEC) 40 MG capsule Take 40 mg by mouth daily.   10/09/2021   ondansetron (ZOFRAN) 4 MG tablet Take 4 mg by mouth 2 (two) times daily.   10/09/2021   oxybutynin (DITROPAN-XL) 5 MG 24 hr tablet Take 5 mg by mouth daily.   10/09/2021   pantoprazole (PROTONIX) 40 MG tablet Take 1 tablet (40 mg total) by mouth 2 (two) times daily before a meal. 180 tablet 1 10/09/2021   polyethylene glycol (MIRALAX / GLYCOLAX) 17 g packet Take 17 g by mouth daily. 14 each 0 10/09/2021   risperiDONE (RISPERDAL) 1 MG tablet Take 1 mg by mouth 2 (two) times daily. Take one tablet at 900am and 1 tablet at 5pm and '2mg'$  at bedtime   10/09/2021   risperiDONE (RISPERDAL) 2 MG tablet Take 2 mg by mouth at bedtime. Take '2mg'$  at bedtime and '1mg'$  at 9am and 5p daily   10/09/2021   SANTYL 250 UNIT/GM ointment Apply 1 Application topically daily.   unk   senna (SENOKOT) 8.6 MG tablet Take 1 tablet by mouth daily.   10/09/2021   Skin Protectants, Misc. (BAZA PROTECT EX) Apply 1 Application topically in the morning and at bedtime.   unk   TRADJENTA 5 MG TABS tablet TAKE (1) TABLET BY MOUTH ONCE DAILY. (Patient taking differently: Take 5 mg by mouth daily.) 30 tablet 3 10/09/2021   calcium carbonate (TUMS - DOSED IN MG ELEMENTAL CALCIUM) 500 MG chewable tablet Chew 1 tablet by mouth 4 (four) times daily as needed for heartburn.      clonazePAM (KLONOPIN) 0.5 MG tablet Take 1 tablet (0.5 mg total) by mouth 3 (three) times daily as needed for up to 5 days for anxiety. 15 tablet 0    EASYMAX TEST test strip USE TO CHECK BLOOD SUGAR FOUR TIMES DAILY.(HOLD IF BS<70: CALL MD IF BS> 400) 100 strip 2    Lancets Thin MISC USE TO CHECK BLOOD SUGAR FOUR TIMES DAILY.(HOLD IF BS<70: CALL MD IF BS> 400) 100 each 2    metoprolol succinate (TOPROL-XL) 25 MG 24 hr tablet Take 5 tablets (  125 mg total) by mouth daily. (Patient not taking: Reported on 10/10/2021) 150 tablet  1 Not Taking   Scheduled:  amiodarone  200 mg Oral Daily   ARIPiprazole  2 mg Oral Daily   atorvastatin  40 mg Oral QHS   busPIRone  7.5 mg Oral BID   clonazePAM  0.5 mg Oral TID   DULoxetine  60 mg Oral Daily   heparin  5,000 Units Subcutaneous Q8H   insulin aspart  0-9 Units Subcutaneous TID WC   insulin aspart  3 Units Subcutaneous TID WC   insulin detemir  12 Units Subcutaneous Q2200   latanoprost  1 drop Both Eyes QHS   linagliptin  5 mg Oral Daily   lubiprostone  8 mcg Oral Daily   metoprolol tartrate  12.5 mg Oral BID   midodrine  10 mg Oral TID with meals   milk and molasses  1 enema Rectal Once   oxybutynin  5 mg Oral Daily   pantoprazole  40 mg Oral BID AC   polyethylene glycol  17 g Oral Daily   risperiDONE  1 mg Oral BID   risperiDONE  2 mg Oral QHS   sorbitol, milk of mag, mineral oil, glycerin (SMOG) enema  960 mL Rectal Once   Continuous:  lactated ringers 125 mL/hr at 10/11/21 0451   meropenem (MERREM) IV 1 g (10/11/21 0249)   vancomycin     TJQ:ZESPQZRAQTMAU **OR** acetaminophen, fentaNYL (SUBLIMAZE) injection, HYDROcodone-acetaminophen, ipratropium-albuterol, polyvinyl alcohol, prochlorperazine  Allergies  Allergen Reactions   Sulfa Antibiotics Rash     ROS:  A comprehensive review of systems was negative except for: Gastrointestinal: positive for abdominal pain and constipation  Blood pressure (!) 112/57, pulse 67, temperature 97.8 F (36.6 C), temperature source Oral, resp. rate (!) 21, height '5\' 5"'$  (1.651 m), weight 58.9 kg, SpO2 99 %. Physical Exam Vitals reviewed.  Constitutional:      Appearance: Normal appearance.  HENT:     Head: Normocephalic.     Mouth/Throat:     Mouth: Mucous membranes are moist.  Eyes:     Extraocular Movements: Extraocular movements intact.  Cardiovascular:     Rate and Rhythm: Normal rate.  Pulmonary:     Effort: Pulmonary effort is normal.  Abdominal:     General: There is no distension.     Palpations:  Abdomen is soft.     Tenderness: There is generalized abdominal tenderness. There is no guarding or rebound.  Musculoskeletal:        General: Normal range of motion.     Cervical back: Normal range of motion.  Skin:    General: Skin is warm.  Neurological:     General: No focal deficit present.     Mental Status: She is alert and oriented to person, place, and time.  Psychiatric:        Mood and Affect: Mood normal.        Behavior: Behavior normal.        Thought Content: Thought content normal.     Results: Results for orders placed or performed during the hospital encounter of 10/10/21 (from the past 48 hour(s))  Troponin I (High Sensitivity)     Status: None   Collection Time: 10/10/21 10:28 AM  Result Value Ref Range   Troponin I (High Sensitivity) 3 <18 ng/L    Comment: (NOTE) Elevated high sensitivity troponin I (hsTnI) values and significant  changes across serial measurements may suggest ACS but many other  chronic and  acute conditions are known to elevate hsTnI results.  Refer to the "Links" section for chest pain algorithms and additional  guidance. Performed at [facility], [address] [place], [place] [address]   Comprehensive metabolic panel     Status: Abnormal   Collection Time: 10/10/21 10:28 AM  Result Value Ref Range   Sodium 136 135 - 145 mmol/L   Potassium 3.5 3.5 - 5.1 mmol/L   Chloride 101 98 - 111 mmol/L   CO2 27 22 - 32 mmol/L   Glucose, Bld 210 (H) 70 - 99 mg/dL    Comment: Glucose reference range applies only to samples taken after fasting for at least 8 hours.   BUN 13 8 - 23 mg/dL   Creatinine, Ser 0.97 0.44 - 1.00 mg/dL   Calcium 8.6 (L) 8.9 - 10.3 mg/dL   Total Protein 6.7 6.5 - 8.1 g/dL   Albumin 3.4 (L) 3.5 - 5.0 g/dL   AST 13 (L) 15 - 41 U/L   ALT 10 0 - 44 U/L   Alkaline Phosphatase 106 38 - 126 U/L   Total Bilirubin 0.4 0.3 - 1.2 mg/dL   GFR, Estimated >60 >60 mL/min    Comment: (NOTE) Calculated using the CKD-EPI Creatinine  Equation (2021)    Anion gap 8 5 - 15    Comment: Performed at [facility], [address], [place], [place] [address]  CBC with Differential     Status: Abnormal   Collection Time: 10/10/21 10:28 AM  Result Value Ref Range   WBC 9.4 4.0 - 10.5 K/uL   RBC 4.25 3.87 - 5.11 MIL/uL   Hemoglobin 11.9 (L) 12.0 - 15.0 g/dL   HCT 36.8 36.0 - 46.0 %   MCV 86.6 80.0 - 100.0 fL   MCH 28.0 26.0 - 34.0 pg   MCHC 32.3 30.0 - 36.0 g/dL   RDW 15.0 11.5 - 15.5 %   Platelets 286 150 - 400 K/uL   nRBC 0.0 0.0 - 0.2 %   Neutrophils Relative % 81 %   Neutro Abs 7.4 1.7 - 7.7 K/uL   Lymphocytes Relative 13 %   Lymphs Abs 1.3 0.7 - 4.0 K/uL   Monocytes Relative 5 %   Monocytes Absolute 0.5 0.1 - 1.0 K/uL   Eosinophils Relative 1 %   Eosinophils Absolute 0.1 0.0 - 0.5 K/uL   Basophils Relative 0 %   Basophils Absolute 0.0 0.0 - 0.1 K/uL   Immature Granulocytes 0 %   Abs Immature Granulocytes 0.03 0.00 - 0.07 K/uL    Comment: Performed at [facility], [address] [place], [place] [address]  Resp Panel by RT-PCR (Flu A&B, Covid) Anterior Nasal Swab     Status: None   Collection Time: 10/10/21 10:28 AM   Specimen: Anterior Nasal Swab  Result Value Ref Range   SARS Coronavirus 2 by RT PCR NEGATIVE NEGATIVE    Comment: (NOTE) SARS-CoV-2 target nucleic acids are NOT DETECTED.  The SARS-CoV-2 RNA is generally detectable in upper respiratory specimens during the acute phase of infection. The lowest concentration of SARS-CoV-2 viral copies this assay can detect is 138 copies/mL. A negative result does not preclude SARS-Cov-2 infection and should not be used as the sole basis for treatment or other patient management decisions. A negative result may occur with  improper specimen collection/handling, submission of specimen other than nasopharyngeal swab, presence of viral mutation(s) within the areas targeted by this assay, and inadequate number of viral copies(<138 copies/mL). A negative result  must be combined with clinical observations, patient history, and epidemiological information. The expected result is Negative.  Fact Sheet for Patients:  EntrepreneurPulse.com.au  Fact Sheet for Healthcare Providers:  IncredibleEmployment.be  This test is no t yet approved or cleared by the [place] FDA and  has been authorized for detection and/or diagnosis of SARS-CoV-2 by FDA under an Emergency Use Authorization (EUA). This EUA will remain  in effect (meaning this test can be used) for the duration of the COVID-19 declaration under Section 564(b)(1) of the Act, 21 U.S.C.section 360bbb-3(b)(1), unless the authorization is terminated  or revoked sooner.       Influenza A by PCR NEGATIVE NEGATIVE   Influenza B by PCR NEGATIVE NEGATIVE    Comment: (NOTE) The Xpert Xpress SARS-CoV-2/FLU/RSV plus assay is intended as an aid in the diagnosis of influenza from Nasopharyngeal swab specimens and should not be used as a sole basis for treatment. Nasal washings and aspirates are unacceptable for Xpert Xpress SARS-CoV-2/FLU/RSV testing.  Fact Sheet for Patients: EntrepreneurPulse.com.au  Fact Sheet for Healthcare Providers: IncredibleEmployment.be  This test is not yet approved or cleared by the [place] FDA and has been authorized for detection and/or diagnosis of SARS-CoV-2 by FDA under an Emergency Use Authorization (EUA). This EUA will remain in effect (meaning this test can be used) for the duration of the COVID-19 declaration under Section 564(b)(1) of the Act, 21 U.S.C. section 360bbb-3(b)(1), unless the authorization is terminated or revoked.  Performed at [facility], [address], [place], [place] [address]   Lactic acid, plasma     Status: Abnormal   Collection Time: 10/10/21 10:28 AM  Result Value Ref Range   Lactic Acid, Venous 2.6 (HH) 0.5 - 1.9 mmol/L    Comment: CRITICAL RESULT  CALLED TO, READ BACK BY AND VERIFIED WITH: [place],M AT 11:05AM ON 10/10/21 BY [facility] Performed at [facility], [address], [place], [place] [address]   Protime-INR     Status: Abnormal   Collection Time: 10/10/21 10:28 AM  Result Value Ref Range   Prothrombin Time 17.8 (H) 11.4 - 15.2 seconds   INR 1.5 (H) 0.8 - 1.2    Comment: (NOTE) INR goal varies based on device and disease states. Performed at [facility], [address] [place], [place] [address]   APTT     Status: None   Collection Time: 10/10/21 10:28 AM  Result Value Ref Range   aPTT 28 24 - 36 seconds    Comment: Performed at [facility], [address] [place], [place] [address]  Urinalysis, Routine w reflex microscopic     Status: Abnormal   Collection Time: 10/10/21 10:28 AM  Result Value Ref Range   Color, Urine YELLOW YELLOW   APPearance CLEAR CLEAR   Specific Gravity, Urine 1.010 1.005 - 1.030   pH 7.0 5.0 - 8.0   Glucose, UA NEGATIVE NEGATIVE mg/dL   Hgb urine dipstick NEGATIVE NEGATIVE   Bilirubin Urine NEGATIVE NEGATIVE   Ketones, ur NEGATIVE NEGATIVE mg/dL   Protein, ur NEGATIVE NEGATIVE mg/dL   Nitrite NEGATIVE NEGATIVE   Leukocytes,Ua SMALL (A) NEGATIVE   RBC / HPF 0-5 0 - 5 RBC/hpf   WBC, UA 6-10 0 - 5 WBC/hpf   Bacteria, UA RARE (A) NONE SEEN   Squamous Epithelial / LPF 0-5 0 - 5   Non Squamous Epithelial 0-5 (A) NONE SEEN    Comment: Performed at [facility], [address], [place],  [address]  Blood Culture (routine x  2)     Status: None (Preliminary result)   Collection Time: 10/10/21 10:35 AM   Specimen: Left Antecubital; Blood  Result Value Ref Range   Specimen Description      LEFT ANTECUBITAL BOTTLES DRAWN AEROBIC AND ANAEROBIC   Special Requests Blood Culture adequate volume    Culture      NO GROWTH < 24 HOURS Performed at [facility], [address], [place], [place] [address]    Report Status PENDING   Type and screen [facility]     Status: None    Collection Time: 10/10/21 10:35 AM  Result Value Ref Range   ABO/RH(D) B POS    Antibody Screen NEG    Sample Expiration      10/13/2021,2359 Performed at [facility], [address], [place], [place] [address]   TSH     Status: None   Collection Time: 10/10/21 10:44 AM  Result Value Ref Range   TSH 1.685 0.350 - 4.500 uIU/mL    Comment: Performed by a 3rd Generation assay with a functional sensitivity of <=0.01 uIU/mL. Performed at [facility], [address], [place], [place] [address]   T4, free     Status: Abnormal   Collection Time: 10/10/21 10:44 AM  Result Value Ref Range   Free T4 1.45 (H) 0.61 - 1.12 ng/dL    Comment: (NOTE) Biotin ingestion may interfere with free T4 tests. If the results are inconsistent with the TSH level, previous test results, or the clinical presentation, then consider biotin interference. If needed, order repeat testing after stopping biotin. Performed at [facility], [place] [address], [place], [place] [address]   Blood Culture (routine x 2)     Status: None (Preliminary result)   Collection Time: 10/10/21 11:25 AM   Specimen: BLOOD RIGHT WRIST  Result Value Ref Range   Specimen Description      BLOOD RIGHT WRIST BOTTLES DRAWN AEROBIC AND ANAEROBIC   Special Requests Blood Culture adequate volume    Culture      NO GROWTH < 24 HOURS Performed at [facility], [address], [place], [place] [address]    Report Status PENDING   Blood gas, venous ([facility], [facility], [facility])     Status: Abnormal   Collection Time: 10/10/21 11:25 AM  Result Value Ref Range   pH, Ven 7.38 7.25 - 7.43   pCO2, Ven 54 44 - 60 mmHg   pO2, Ven <31 (LL) 32 - 45 mmHg    Comment: CRITICAL RESULT CALLED TO, READ BACK BY AND VERIFIED WITH: WHITE,M AT 11:40AM ON 10/10/21 BY FESTERMAN,C    Bicarbonate 31.9 (H) 20.0 - 28.0 mmol/L   Acid-Base Excess 5.6 (H) 0.0 - 2.0 mmol/L   O2 Saturation 39.7 %   Patient temperature 36.9    Collection site RIGHT ANTECUBITAL    Drawn by  [id number]     Comment: Performed at [facility], [address], [place], [place] [address]  Lactic acid, plasma     Status: Abnormal   Collection Time: 10/10/21 12:43 PM  Result Value Ref Range   Lactic Acid, Venous 2.1 (HH) 0.5 - 1.9 mmol/L    Comment: CRITICAL VALUE NOTED.  VALUE IS CONSISTENT WITH PREVIOUSLY REPORTED AND CALLED VALUE. Performed at [facility], [address], [place], [place] [address]   Troponin I (High Sensitivity)     Status: None   Collection Time: 10/10/21 12:43 PM  Result Value Ref Range   Troponin I (High Sensitivity)  3 <18 ng/L    Comment: (NOTE) Elevated high sensitivity troponin I (hsTnI) values and significant  changes across serial measurements may suggest ACS but many other  chronic and acute conditions are known to elevate hsTnI results.  Refer to the "Links" section for chest pain algorithms and additional  guidance. Performed at [facility], [address] [place], [place] [address]   CBC     Status: Abnormal   Collection Time: 10/10/21  4:41 PM  Result Value Ref Range   WBC 9.4 4.0 - 10.5 K/uL   RBC 3.90 3.87 - 5.11 MIL/uL   Hemoglobin 10.8 (L) 12.0 - 15.0 g/dL   HCT 33.9 (L) 36.0 - 46.0 %   MCV 86.9 80.0 - 100.0 fL   MCH 27.7 26.0 - 34.0 pg   MCHC 31.9 30.0 - 36.0 g/dL   RDW 15.1 11.5 - 15.5 %   Platelets 249 150 - 400 K/uL   nRBC 0.0 0.0 - 0.2 %    Comment: Performed at [facility], [address] [place], [place] [address]  Creatinine, serum     Status: None   Collection Time: 10/10/21  4:41 PM  Result Value Ref Range   Creatinine, Ser 0.69 0.44 - 1.00 mg/dL   GFR, Estimated >60 >60 mL/min    Comment: (NOTE) Calculated using the CKD-EPI Creatinine Equation (2021) Performed at [facility], [address] [place], [place] [address]   Lactic acid, plasma     Status: None   Collection Time: 10/10/21  5:30 PM  Result Value Ref Range   Lactic Acid, Venous 1.2 0.5 - 1.9 mmol/L    Comment: Performed at [facility], [address]  [place], [place] [address]  CBG monitoring, ED     Status: Abnormal   Collection Time: 10/10/21  5:39 PM  Result Value Ref Range   Glucose-Capillary 110 (H) 70 - 99 mg/dL    Comment: Glucose reference range applies only to samples taken after fasting for at least 8 hours.  Glucose, capillary     Status: Abnormal   Collection Time: 10/10/21  9:21 PM  Result Value Ref Range   Glucose-Capillary 148 (H) 70 - 99 mg/dL    Comment: Glucose reference range applies only to samples taken after fasting for at least 8 hours.  Glucose, capillary     Status: Abnormal   Collection Time: 10/11/21  3:54 AM  Result Value Ref Range   Glucose-Capillary 125 (H) 70 - 99 mg/dL    Comment: Glucose reference range applies only to samples taken after fasting for at least 8 hours.  Comprehensive metabolic panel     Status: Abnormal   Collection Time: 10/11/21  5:05 AM  Result Value Ref Range   Sodium 138 135 - 145 mmol/L   Potassium 3.2 (L) 3.5 - 5.1 mmol/L   Chloride 107 98 - 111 mmol/L   CO2 26 22 - 32 mmol/L   Glucose, Bld 91 70 - 99 mg/dL    Comment: Glucose reference range applies only to samples taken after fasting for at least 8 hours.   BUN 9 8 - 23 mg/dL   Creatinine, Ser 0.55 0.44 - 1.00 mg/dL   Calcium 7.7 (L) 8.9 - 10.3 mg/dL   Total Protein 5.1 (L) 6.5 - 8.1 g/dL   Albumin 2.3 (L) 3.5 - 5.0 g/dL   AST 29 15 - 41 U/L   ALT 13 0 - 44 U/L   Alkaline Phosphatase 98 38 - 126 U/L  Total Bilirubin 0.5 0.3 - 1.2 mg/dL   GFR, Estimated >60 >60 mL/min    Comment: (NOTE) Calculated using the CKD-EPI Creatinine Equation (2021)    Anion gap 5 5 - 15    Comment: Performed at [facility], [address] [place], [place] [address]  Magnesium     Status: None   Collection Time: 10/11/21  5:05 AM  Result Value Ref Range   Magnesium 1.7 1.7 - 2.4 mg/dL    Comment: Performed at [facility], [address] [place], [place] [address]  CBC with Differential/Platelet     Status: Abnormal   Collection Time:  10/11/21  5:05 AM  Result Value Ref Range   WBC 6.9 4.0 - 10.5 K/uL   RBC 3.35 (L) 3.87 - 5.11 MIL/uL   Hemoglobin 9.3 (L) 12.0 - 15.0 g/dL   HCT 28.8 (L) 36.0 - 46.0 %   MCV 86.0 80.0 - 100.0 fL   MCH 27.8 26.0 - 34.0 pg   MCHC 32.3 30.0 - 36.0 g/dL   RDW 15.1 11.5 - 15.5 %   Platelets 197 150 - 400 K/uL   nRBC 0.0 0.0 - 0.2 %   Neutrophils Relative % 64 %   Neutro Abs 4.3 1.7 - 7.7 K/uL   Lymphocytes Relative 27 %   Lymphs Abs 1.9 0.7 - 4.0 K/uL   Monocytes Relative 6 %   Monocytes Absolute 0.4 0.1 - 1.0 K/uL   Eosinophils Relative 3 %   Eosinophils Absolute 0.2 0.0 - 0.5 K/uL   Basophils Relative 0 %   Basophils Absolute 0.0 0.0 - 0.1 K/uL   Immature Granulocytes 0 %   Abs Immature Granulocytes 0.02 0.00 - 0.07 K/uL    Comment: Performed at [facility], [address] [place], [place] [address]  Glucose, capillary     Status: None   Collection Time: 10/11/21  8:13 AM  Result Value Ref Range   Glucose-Capillary 96 70 - 99 mg/dL    Comment: Glucose reference range applies only to samples taken after fasting for at least 8 hours.   Personally reviewed- gallbladder with stones, normal filling of the HIDA NM Hepatobiliary Liver Func  Result Date: 10/11/2021 CLINICAL DATA:  Nausea and abdominal pain. EXAM: NUCLEAR MEDICINE HEPATOBILIARY IMAGING TECHNIQUE: Sequential images of the abdomen were obtained out to 60 minutes following intravenous administration of radiopharmaceutical. RADIOPHARMACEUTICALS:  5.5 mCi Tc-31m Choletec IV COMPARISON:  CT abdomen pelvis from yesterday. FINDINGS: Prompt uptake and biliary excretion of activity by the liver is seen. Gallbladder activity is visualized, consistent with patency of cystic duct. Biliary activity passes into small bowel, consistent with patent common bile duct. IMPRESSION: Normal hepatobiliary scan.  No evidence of cholecystitis. Electronically Signed   By: WTitus DubinM.D.   On: 10/11/2021 08:26   CT Angio Chest/Abd/Pel for  Dissection W and/or Wo Contrast  Result Date: 10/10/2021 CLINICAL DATA:  Increased weakness, hypotension and bradycardia. EXAM: CT ANGIOGRAPHY CHEST, ABDOMEN AND PELVIS TECHNIQUE: Non-contrast CT of the chest was initially obtained. Multidetector CT imaging through the chest, abdomen and pelvis was performed using the standard protocol during bolus administration of intravenous contrast. Multiplanar reconstructed images and MIPs were obtained and reviewed to evaluate the vascular anatomy. RADIATION DOSE REDUCTION: This exam was performed according to the departmental dose-optimization program which includes automated exposure control, adjustment of the mA and/or kV according to patient size and/or use of iterative reconstruction technique. CONTRAST:  726mOMNIPAQUE IOHEXOL 350 MG/ML SOLN COMPARISON:  CT abdomen  pelvis, 07/14/2021. Current chest radiograph. Chest CTA, 05/18/2021. FINDINGS: CTA CHEST FINDINGS Cardiovascular: Thoracic aorta is normal in caliber. No dissection. Mild atherosclerosis. No significant stenosis. Arch branch vessels are widely patent. Heart mildly enlarged. Three-vessel coronary artery calcifications. No pericardial effusion. Main pulmonary artery is mildly dilated to 3.5 cm. Mediastinum/Nodes: Small thyroid nodules up to 1.3 cm. No follow-up recommended. No neck base, mediastinal or hilar masses. Several subcentimeter but prominent mediastinal lymph nodes. Mildly enlarged subcarinal lymph node, 1.5 cm in short axis. No hilar adenopathy. Normal trachea. Esophagus mildly distended. Small to moderate-sized hiatal hernia. Lungs/Pleura: Mild peripheral interstitial thickening and areas of mild reticular scarring. Minor dependent subsegmental atelectasis. Small calcified granuloma in the right lower lobe. No evidence of pneumonia or pulmonary edema. No mass or suspicious nodule. No pleural effusion or pneumothorax. Musculoskeletal: No fracture or acute finding. No bone lesion. Skeletal  structures are demineralized. No chest wall mass. Review of the MIP images confirms the above findings. CTA ABDOMEN AND PELVIS FINDINGS VASCULAR Aorta: Aorta normal in caliber. Diffuse atherosclerosis with no significant stenosis. No dissection. Celiac: Patent without evidence of aneurysm, dissection, vasculitis or significant stenosis. SMA: Patent without evidence of aneurysm, dissection, vasculitis or significant stenosis. Renals: Both renal arteries are patent without evidence of aneurysm, dissection, vasculitis, fibromuscular dysplasia or significant stenosis. IMA: Patent without evidence of aneurysm, dissection, vasculitis or significant stenosis. Inflow: Patent without evidence of aneurysm, dissection, vasculitis or significant stenosis. Veins: No obvious venous abnormality within the limitations of this arterial phase study. Review of the MIP images confirms the above findings. NON-VASCULAR Hepatobiliary: Liver normal in size. 3.4 cm right lobe hemangioma, stable. No other liver masses or lesions. Gallbladder is distended. There are scattered wall calcifications. Small dependent gallstone with another stone projecting in the cystic duct. No pericholecystic inflammation. Common bile duct measures up to 9 mm. No common bile duct stone. Pancreas: Unremarkable. No pancreatic ductal dilatation or surrounding inflammatory changes. Spleen: Normal in size without focal abnormality. Adrenals/Urinary Tract: No adrenal nodule. Kidneys normal in size, orientation and position. Mild dilation of the intrarenal collecting systems and proximal to mid ureters, relatively symmetric. No ureteral stones. Bladder moderately distended, otherwise unremarkable. Stomach/Bowel: Rectum significantly distended with stool, measuring 8.5 cm in diameter. Colon is also distended with stool, which is moderate to markedly increased. Small bowel is normal in caliber. There is no bowel wall thickening or inflammatory changes. Small to moderate  hiatal hernia. Stomach otherwise unremarkable. Lymphatic: Prominent gastrohepatic ligament lymph nodes, up to 1 cm in short axis. These are stable. No other enlarged lymph nodes. Reproductive: Uterus and bilateral adnexa are unremarkable. Other: No abdominal wall hernia or abnormality. No abdominopelvic ascites. Musculoskeletal: Next lucent and sclerotic area in the S1 vertebra that is stable consistent with a benign lesion. No fracture or acute finding. No suspicious bone lesion. Review of the MIP images confirms the above findings. IMPRESSION: 1. Normal caliber thoracoabdominal aorta. No dissection. No evidence of acute aortic syndrome. 2. Aortic atherosclerosis no significant stenosis. Aortic branch vessels are all widely patent. 3. Distended gallbladder with scattered wall calcification, small dependent stone and small stone in the cystic duct, but no adjacent inflammation. Findings are similar to the prior CT. Consider early acute cholecystitis if there are consistent clinical findings. 4. Small to moderate hiatal hernia. 5. Rectum significantly distended with stool. Moderate to marked increase in colonic stool. No bowel obstruction or inflammation. Electronically Signed   By: Lajean Manes M.D.   On: 10/10/2021 14:07   CT Head  Wo Contrast  Result Date: 10/10/2021 CLINICAL DATA:  Altered mental status, hypotension, bradycardia EXAM: CT HEAD WITHOUT CONTRAST TECHNIQUE: Contiguous axial images were obtained from the base of the skull through the vertex without intravenous contrast. RADIATION DOSE REDUCTION: This exam was performed according to the departmental dose-optimization program which includes automated exposure control, adjustment of the mA and/or kV according to patient size and/or use of iterative reconstruction technique. COMPARISON:  09/21/2021 FINDINGS: Brain: No acute intracranial findings are seen. There are no signs of bleeding within the cranium. Cortical sulci are prominent. There is  decreased density in periventricular white matter. Vascular: Scattered arterial calcifications are seen. Skull: There is mild hyperostosis frontalis interna. Sinuses/Orbits: Defects in the medial walls of maxillary sinuses may be postsurgical. There are no air-fluid levels in the visualized paranasal sinuses. Other: None. IMPRESSION: No acute intracranial findings are seen in noncontrast CT brain. Atrophy. Small vessel disease. Electronically Signed   By: Elmer Picker M.D.   On: 10/10/2021 13:54   DG Chest Port 1 View  Result Date: 10/10/2021 CLINICAL DATA:  Possible sepsis EXAM: PORTABLE CHEST 1 VIEW COMPARISON:  Previous studies including the examination of 09/21/2021 FINDINGS: Transverse diameter of heart is slightly increased. There are no signs of pulmonary edema or focal pulmonary consolidation. Left lateral CP angle is indistinct with no interval change. There is no pneumothorax. Degenerative changes are noted in both shoulders. IMPRESSION: There are no new infiltrates or signs of pulmonary edema. Electronically Signed   By: Elmer Picker M.D.   On: 10/10/2021 10:52     Assessment & Plan:  HULDA REDDIX is a 76 y.o. female with gallstones but more generalized pain. She has some fecal impaction and other issues going on too. She has multiple medical admissions recently. She does not really describe any pain with greasy or fatty foods. She is currently constipated.   Enema / disimpaction recommended Follow up with GI Discussed with her monitoring for any issues with food that could be attributed to her gallstones  She has Hypertrophic obstructive cardiomyopathy LVOT gradient 37 mm HG and is preload dependent base on the cardiology notes from August 2023. She likely is high risk and would need cardiology to weigh in before any surgery would be offered. Based on the description of preload dependent, I wonder if she would tolerate induction or insufflation. This again can be further  determined if the gallbladder ever becomes a true problem.   All questions were answered to the satisfaction of the patient.     [name] 10/11/2021, 9:27 AM

## 2021-10-11 NOTE — TOC Initial Note (Addendum)
Transition of Care [facility]) - Initial/Assessment Note    Patient Details  Name: [name] MRN: [id number] Date of Birth: 1945-11-10  Transition of Care [facility]) CM/SW Contact:    Salome Arnt, LCSW Phone Number: 10/11/2021, 11:16 AM  Clinical Narrative:  Pt admitted due to septic shock. Pt has been a resident at [facility] for the past few weeks. She has a legal guardian, [name]. LCSW left voicemail for Almyra Free requesting return call. Per Jackelyn Poling at [facility], anticipate pt will be long term. Okay to return. TOC will continue to follow.                 *Update: LCSW spoke with Almyra Free who is agreeable to return to [facility] at d/c.   Expected Discharge Plan: Skilled Nursing Facility Barriers to Discharge: Continued Medical Work up   Patient Goals and CMS Choice Patient states their goals for this hospitalization and ongoing recovery are:: anticipate return to SNF      Expected Discharge Plan and Services Expected Discharge Plan: [place] In-house Referral: Clinical Social Work     Living arrangements for the past 2 months: [place]                                      Prior Living Arrangements/Services Living arrangements for the past 2 months: [place] Lives with:: Facility Resident Patient language and need for interpreter reviewed:: Yes          Care giver support system in place?: Yes (comment)   Criminal Activity/Legal Involvement Pertinent to Current Situation/Hospitalization: No - Comment as needed  Activities of Daily Living Home Assistive Devices/Equipment: Wheelchair ADL Screening (condition at time of admission) Patient's cognitive ability adequate to safely complete daily activities?: No Is the patient deaf or have difficulty hearing?: No Does the patient have difficulty seeing, even when wearing glasses/contacts?: No Does the patient have difficulty concentrating,  remembering, or making decisions?: Yes Patient able to express need for assistance with ADLs?: Yes Does the patient have difficulty dressing or bathing?: Yes Independently performs ADLs?: No Communication: Independent Is this a change from baseline?: Pre-admission baseline Dressing (OT): Dependent Is this a change from baseline?: Pre-admission baseline Grooming: Dependent Is this a change from baseline?: Pre-admission baseline Feeding: Independent Is this a change from baseline?: Pre-admission baseline Bathing: Dependent Is this a change from baseline?: Pre-admission baseline Toileting: Dependent Is this a change from baseline?: Pre-admission baseline In/Out Bed: Dependent Is this a change from baseline?: Pre-admission baseline Walks in Home: Dependent Is this a change from baseline?: Pre-admission baseline Does the patient have difficulty walking or climbing stairs?: Yes Weakness of Legs: Both Weakness of Arms/Hands: None  Permission Sought/Granted                  Emotional Assessment   Attitude/Demeanor/Rapport: Unable to Assess Affect (typically observed): Unable to Assess   Alcohol / Substance Use: Not Applicable Psych Involvement: No (comment)  Admission diagnosis:  Calculus of gallbladder without cholecystitis without obstruction [K80.20] Sepsis ([place]) [A41.9] Hypotension, unspecified hypotension type [I95.9] Patient Active Problem List   Diagnosis Date Noted   Calculus of gallbladder without cholecystitis without obstruction    Septic Shock 10/10/2021   DNR (do not resuscitate) 10/10/2021   Long term current use of anticoagulant therapy 10/10/2021   Lactic acidosis 10/10/2021   Failure to thrive in adult 10/10/2021  HOCM (hypertrophic obstructive cardiomyopathy) ([place]) 09/19/2021   Pressure injury of skin 09/16/2021   Goals of care, counseling/discussion 09/16/2021   UTI (urinary tract infection) 09/14/2021   Septic shock ([place]) 09/13/2021   Pneumonia  08/14/2021   PNA (pneumonia) 08/13/2021   Opioid dependence ([place]) 07/24/2021   Prolonged QT interval 07/22/2021   Mood disorder ([place]) 05/18/2021   Fecal impaction ([place]) 04/23/2021   Paroxysmal atrial fibrillation ([place]) 03/22/2021   SBO (small bowel obstruction) ([place]) 04/18/2020   Atrial fibrillation with RVR ([place]) 10/04/2019   GERD (gastroesophageal reflux disease) 08/26/2019   Constipation-opioid induced 09/09/2018   Hypotensive episode 03/21/2018   Primary osteoarthritis of right hip 04/18/2017   History of cerebrovascular accident (CVA) with residual deficit 03/28/2017   Schizophrenia ([place]) 03/28/2017   Generalized weakness 03/28/2017   IBS (irritable bowel syndrome) 10/16/2016   Left hemiparesis ([place]) 10/06/2016   Chronic superficial gastritis without bleeding    Stricture and stenosis of esophagus    IDA (iron deficiency anemia) 04/06/2015   Sedentary lifestyle 11/06/2014   Uncontrolled type 2 diabetes mellitus with hyperglycemia, with long-term current use of insulin ([place]) 03/24/2010   Essential hypertension 03/24/2010   DVT, lower extremity, recurrent ([place]) 03/24/2010   PCP:  Moss Mc, NP Pharmacy:   [facility], [place] [address] Phone: [phone] Fax: [phone]     Social Determinants of Health (SDOH) Interventions    Readmission Risk Interventions    10/11/2021   11:15 AM 09/16/2021    2:02 PM 03/29/2021   10:51 AM  Readmission Risk Prevention Plan  Transportation Screening Complete Complete Complete  Medication Review Press photographer) Complete Complete   HRI or Home Care Consult Complete Complete Complete  SW Recovery Care/Counseling Consult Complete  Complete  Palliative Care Screening Not Applicable Not Applicable Not Applicable  Skilled Nursing Facility Complete Not Applicable Complete

## 2021-10-11 NOTE — NC FL2 (Signed)
[place] LEVEL OF CARE SCREENING TOOL     IDENTIFICATION  Patient Name: [name] Birthdate: 06/27/1945 Sex: female Admission Date (Current Location): 10/10/2021  [place] and [place] Number:  [facility] [id number] [place] and Address:  [facility] [address] [place]      Provider Number: [id number]  Attending Physician Name and Address:  Murlean Iba, MD  Relative Name and Phone Number:  Cheree Ditto (legal guardian)    Current Level of Care: Hospital Recommended Level of Care: [place] Prior Approval Number:    Date Approved/Denied:   PASRR Number:    Discharge Plan: SNF    Current Diagnoses: Patient Active Problem List   Diagnosis Date Noted   Calculus of gallbladder without cholecystitis without obstruction    Septic Shock 10/10/2021   DNR (do not resuscitate) 10/10/2021   Long term current use of anticoagulant therapy 10/10/2021   Lactic acidosis 10/10/2021   Failure to thrive in adult 10/10/2021   HOCM (hypertrophic obstructive cardiomyopathy) ([place]) 09/19/2021   Pressure injury of skin 09/16/2021   Goals of care, counseling/discussion 09/16/2021   UTI (urinary tract infection) 09/14/2021   Septic shock ([place]) 09/13/2021   Pneumonia 08/14/2021   PNA (pneumonia) 08/13/2021   Opioid dependence ([place]) 07/24/2021   Prolonged QT interval 07/22/2021   Mood disorder ([place]) 05/18/2021   Fecal impaction ([place]) 04/23/2021   Paroxysmal atrial fibrillation ([place]) 03/22/2021   SBO (small bowel obstruction) ([place]) 04/18/2020   Atrial fibrillation with RVR ([place]) 10/04/2019   GERD (gastroesophageal reflux disease) 08/26/2019   Constipation-opioid induced 09/09/2018   Hypotensive episode 03/21/2018   Primary osteoarthritis of right hip 04/18/2017   History of cerebrovascular accident (CVA) with residual deficit 03/28/2017   Schizophrenia ([place]) 03/28/2017   Generalized weakness 03/28/2017   IBS  (irritable bowel syndrome) 10/16/2016   Left hemiparesis ([place]) 10/06/2016   Chronic superficial gastritis without bleeding    Stricture and stenosis of esophagus    IDA (iron deficiency anemia) 04/06/2015   Sedentary lifestyle 11/06/2014   Uncontrolled type 2 diabetes mellitus with hyperglycemia, with long-term current use of insulin ([place]) 03/24/2010   Essential hypertension 03/24/2010   DVT, lower extremity, recurrent ([place]) 03/24/2010    Orientation RESPIRATION BLADDER Height & Weight     Self, Situation, Place  Normal External catheter Weight: 129 lb 13.6 oz (58.9 kg) Height:  '5\' 5"'$  (165.1 cm)  BEHAVIORAL SYMPTOMS/MOOD NEUROLOGICAL BOWEL NUTRITION STATUS      Incontinent Diet (Dysphagia 3 with thin liquids. See d/c summary for updates.)  AMBULATORY STATUS COMMUNICATION OF NEEDS Skin   Extensive Assist Verbally Other (Comment), Bruising (Blister to left foot, redness to groin. Ischial tuberosity stage II with foam dressing.)                       Personal Care Assistance Level of Assistance  Bathing, Feeding, Dressing Bathing Assistance: Maximum assistance Feeding assistance: Limited assistance Dressing Assistance: Maximum assistance     Functional Limitations Info  Sight, Hearing, Speech Sight Info: Impaired Hearing Info: Adequate Speech Info: Adequate    SPECIAL CARE FACTORS FREQUENCY                       Contractures      Additional Factors Info  Code Status, Allergies, Psychotropic, Isolation Precautions, Insulin Sliding Scale Code Status Info: DNR Allergies Info: Sulfa Antibiotics Psychotropic Info: Abilify, Buspar, Klonopin, Cymbalta, Remeron, Risperdal   Isolation Precautions  Info: ESBL 09/13/21, MRSA 09/14/21     Current Medications (10/11/2021):  This is the current hospital active medication list Current Facility-Administered Medications  Medication Dose Route Frequency Provider Last Rate Last Admin   acetaminophen (TYLENOL) tablet 650 mg  650  mg Oral Q6H PRN Johnson, Clanford L, MD       Or   acetaminophen (TYLENOL) suppository 650 mg  650 mg Rectal Q6H PRN Johnson, Clanford L, MD       amiodarone (PACERONE) tablet 200 mg  200 mg Oral Daily Johnson, Clanford L, MD   200 mg at 10/11/21 0854   ARIPiprazole (ABILIFY) tablet 2 mg  2 mg Oral Daily Johnson, Clanford L, MD       atorvastatin (LIPITOR) tablet 40 mg  40 mg Oral QHS Johnson, Clanford L, MD   40 mg at 10/10/21 2116   busPIRone (BUSPAR) tablet 7.5 mg  7.5 mg Oral BID Johnson, Clanford L, MD   7.5 mg at 10/11/21 0852   clonazePAM (KLONOPIN) tablet 0.5 mg  0.5 mg Oral TID Wynetta Emery, Clanford L, MD   0.5 mg at 10/11/21 0853   DULoxetine (CYMBALTA) DR capsule 60 mg  60 mg Oral Daily Johnson, Clanford L, MD   60 mg at 10/11/21 0852   fentaNYL (SUBLIMAZE) injection 25 mcg  25 mcg Intravenous Q2H PRN Johnson, Clanford L, MD       heparin injection 5,000 Units  5,000 Units Subcutaneous Q8H Johnson, Clanford L, MD   5,000 Units at 10/11/21 0500   HYDROcodone-acetaminophen (NORCO) 10-325 MG per tablet 1 tablet  1 tablet Oral Q8H PRN Wynetta Emery, Clanford L, MD   1 tablet at 10/10/21 2117   insulin aspart (novoLOG) injection 0-9 Units  0-9 Units Subcutaneous TID WC Johnson, Clanford L, MD       insulin aspart (novoLOG) injection 3 Units  3 Units Subcutaneous TID WC Johnson, Clanford L, MD       insulin detemir (LEVEMIR) injection 12 Units  12 Units Subcutaneous Q2200 Johnson, Clanford L, MD   12 Units at 10/10/21 2200   ipratropium-albuterol (DUONEB) 0.5-2.5 (3) MG/3ML nebulizer solution 3 mL  3 mL Nebulization Q6H PRN Johnson, Clanford L, MD       lactated ringers infusion   Intravenous Continuous Wynetta Emery, Clanford L, MD 125 mL/hr at 10/11/21 0451 Infusion Verify at 10/11/21 0451   latanoprost (XALATAN) 0.005 % ophthalmic solution 1 drop  1 drop Both Eyes QHS Johnson, Clanford L, MD   1 drop at 10/10/21 2113   linagliptin (TRADJENTA) tablet 5 mg  5 mg Oral Daily Johnson, Clanford L, MD   5 mg at  10/11/21 0853   lubiprostone (AMITIZA) capsule 8 mcg  8 mcg Oral Daily Johnson, Clanford L, MD   8 mcg at 10/11/21 0853   meropenem (MERREM) 1 g in sodium chloride 0.9 % 100 mL IVPB  1 g Intravenous Q8H Johnson, Clanford L, MD 200 mL/hr at 10/11/21 0249 1 g at 10/11/21 0249   metoprolol tartrate (LOPRESSOR) tablet 12.5 mg  12.5 mg Oral BID Johnson, Clanford L, MD   12.5 mg at 10/11/21 0854   midodrine (PROAMATINE) tablet 10 mg  10 mg Oral TID with meals Johnson, Clanford L, MD   10 mg at 10/11/21 0854   milk and molasses enema  1 enema Rectal Once Virl Cagey, MD       oxybutynin (DITROPAN-XL) 24 hr tablet 5 mg  5 mg Oral Daily Johnson, Clanford L, MD   5 mg at  10/11/21 0853   pantoprazole (PROTONIX) EC tablet 40 mg  40 mg Oral BID AC Johnson, Clanford L, MD   40 mg at 10/11/21 0854   polyethylene glycol (MIRALAX / GLYCOLAX) packet 17 g  17 g Oral Daily Johnson, Clanford L, MD   17 g at 10/11/21 3419   polyvinyl alcohol (LIQUIFILM TEARS) 1.4 % ophthalmic solution 1 drop  1 drop Both Eyes TID PRN Johnson, Clanford L, MD       prochlorperazine (COMPAZINE) injection 10 mg  10 mg Intravenous Q4H PRN Johnson, Clanford L, MD       risperiDONE (RISPERDAL) tablet 1 mg  1 mg Oral BID Johnson, Clanford L, MD   1 mg at 10/11/21 0853   risperiDONE (RISPERDAL) tablet 2 mg  2 mg Oral QHS Johnson, Clanford L, MD   2 mg at 10/10/21 2114   sorbitol, milk of mag, mineral oil, glycerin (SMOG) enema  960 mL Rectal Once Johnson, Clanford L, MD       vancomycin (VANCOCIN) IVPB 1000 mg/200 mL premix  1,000 mg Intravenous Q24H Johnson, Clanford L, MD         Discharge Medications: Please see discharge summary for a list of discharge medications.  Relevant Imaging Results:  Relevant Lab Results:   Additional Information SSN: [id number]  [place], Letta Pate, [place]

## 2021-10-11 NOTE — Assessment & Plan Note (Signed)
--  fortunately HIDA scan was normal --appreciate surgery consult, no need for cholecystectomy

## 2021-10-11 NOTE — Progress Notes (Signed)
PROGRESS NOTE   [name]  YDX:[id number] DOB: 1946/01/20 DOA: 10/10/2021 PCP: Moss Mc, NP   Chief Complaint  Patient presents with   Hypotension   Level of care: Telemetry  Brief Admission History:  76 year old female well-known to the medical service given frequent recent hospitalizations and ongoing failure to thrive was recently discharged from the hospital 09/19/2021.  Seen in the ED 2 days later 09/21/2021.  She had discharged to SNF.  She has a history of schizophrenia, hypertension, type 2 diabetes mellitus, chronic anxiety with benzodiazepine dependence, chronic pain and opioid dependence, paroxysmal atrial fibrillation, DVT, multiple hospitalizations for sepsis and shock, chronic hypotension on midodrine, GERD and diastolic heart failure.  Recently treated for acute tracheobronchitis and also noted to have hypertrophic obstructive cardiomyopathy.  She has a history of stricture and stenosis of the esophagus as well.  She presented to the [place] office today for scheduled outpatient visit.  She was noted to be severely hypotensive, lethargic and complaining of abdominal pain.  EMS was called to take her to the emergency department.  She was noted to have a presentation of septic shock.  She was hypotensive, appeared clinically dry and complained of abdominal pain. CT abdomen was notable for cholelithiasis. Surgery recommended she have a HIDA scan.  She was resuscitated with fluids and antibiotics.  She is being admitted for further management.     Assessment and Plan: * Septic Shock - Patient presented severely hypotensive with exam findings concerning for an intra-abdominal infection -Fortunately she responded to IV fluid hydration, fluid boluses and supportive measures -She has been started on broad-spectrum IV antibiotic therapy -Blood cultures x2 ordered and will be followed  Failure to thrive in adult - Multiple recent hospitalizations and poor quality of  life  Paroxysmal atrial fibrillation ([place]) - Resume home amiodarone and metoprolol - resume home apixaban now that HIDA scan normal  Schizophrenia ([place]) - Resume home behavioral health medications  Uncontrolled type 2 diabetes mellitus with hyperglycemia, with long-term current use of insulin ([place]) - Resume Levemir and sliding scale coverage - Continue CBG monitoring CBG (last 3)  Recent Labs    10/11/21 0354 10/11/21 0813 10/11/21 1112  GLUCAP 125* 96 173*     Calculus of gallbladder without cholecystitis without obstruction --fortunately HIDA scan was normal --appreciate surgery consult, no need for cholecystectomy  Lactic acidosis - Treating with aggressive IV fluid hydration and resolved   Long term current use of anticoagulant therapy - resuming home apixaban now that HIDA scan normal  DNR (do not resuscitate) - Continue DNR order while in hospital  HOCM (hypertrophic obstructive cardiomyopathy) ([place]) - Most recent echocardiogram 09/19/2021 Severe asymmetric hypertrophy of the basal septum up to 47m. There is evidence of SAM of the MV leaflets with obstruction up to 37 mmHG (variation noted due to afib). 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 Would  consider a cardiac MRI for better characterization. This was not well characterized on the last echocardiogram. Left ventricular ejection fraction, by estimation, is 65 to 70%. The left ventricle has normal function. The left ventricle has no regional  wall motion abnormalities. There is severe asymmetric left ventricular hypertrophy of the basal-septal segment. Left ventricular diastolic function could not be evaluated.   Pressure injury of skin - Skin care protocol  Opioid dependence ([place] - Resume home pain management medication  Mood disorder ([place] - Resuming home behavioral health medications  GERD (gastroesophageal reflux disease) - Protonix for GI protection, Tums prn for  symptoms  Constipation-opioid induced - Patient takes movantik 12.5 mg for this - CT scan shows large amount of stool in the rectal vault - Enema ordered 10/10/2021 was NOT given, Reordered enema 10/11/21  Hypotensive episode - This is a chronic issue for the patient and she is on midodrine 10 mg 3 times daily for this  Generalized weakness - Anticipate improvement with resuscitation and plan returning to SNF when medically stabilized  History of cerebrovascular accident (CVA) with residual deficit - Resume home atorvastatin for secondary prevention - resuming home apixaban  Left hemiparesis ([place]) - [place] of prior CVA unchanged  Stricture and stenosis of esophagus - Soft foods only  IDA (iron deficiency anemia) - Hemoglobin 11.9, patient no longer on iron supplement  Sedentary lifestyle - Patient currently resident of SNF   DVT prophylaxis:  Code Status: DNR  Family Communication:  Disposition: anticipating SNF Remains inpatient appropriate because: IV antibiotics, IV fluids    Consultants:   Procedures:   Antimicrobials:    Subjective: Pt reports less SOB today, still with persistent cough, chest congestion, abd pain, enema was not given as ordered on 9/18.    Objective: Vitals:   10/11/21 0528 10/11/21 0829 10/11/21 0854 10/11/21 1121  BP: (!) 85/46 102/80 (!) 112/57   Pulse: 74 63 67   Resp: 16 (!) 21    Temp: 97.8 F (36.6 C) 97.8 F (36.6 C)    TempSrc: Oral Oral    SpO2: 100% 99%  98%  Weight:      Height:        Intake/Output Summary (Last 24 hours) at 10/11/2021 1144 Last data filed at 10/11/2021 0900 Gross per 24 hour  Intake 1836.25 ml  Output 300 ml  Net 1536.25 ml   Filed Weights   10/10/21 1025 10/10/21 2046 10/10/21 2052  Weight: 57.2 kg 58.9 kg 58.9 kg   Examination:  General exam: Appears calm and comfortable  Respiratory system: anterior upper airway congestion noises.  Cardiovascular system: normal S1 & S2 heard. No JVD,  murmurs, rubs, gallops or clicks. No pedal edema. Gastrointestinal system: Abdomen is very mildly distended, soft and mild epigastric tenderness. No organomegaly or masses felt. Normal bowel sounds heard. Central nervous system: Alert and oriented. No focal neurological deficits. Extremities: Symmetric 5 x 5 power. Skin: No rashes, lesions or ulcers. Psychiatry: Judgement and insight appear poor. Mood & affect appropriate.   Data Reviewed: I have personally reviewed following labs and imaging studies  CBC: Recent Labs  Lab 10/10/21 1028 10/10/21 1641 10/11/21 0505  WBC 9.4 9.4 6.9  NEUTROABS 7.4  --  4.3  HGB 11.9* 10.8* 9.3*  HCT 36.8 33.9* 28.8*  MCV 86.6 86.9 86.0  PLT 286 249 998    Basic Metabolic Panel: Recent Labs  Lab 10/10/21 1028 10/10/21 1641 10/11/21 0505  NA 136  --  138  K 3.5  --  3.2*  CL 101  --  107  CO2 27  --  26  GLUCOSE 210*  --  91  BUN 13  --  9  CREATININE 0.97 0.69 0.55  CALCIUM 8.6*  --  7.7*  MG  --   --  1.7    CBG: Recent Labs  Lab 10/10/21 1739 10/10/21 2121 10/11/21 0354 10/11/21 0813 10/11/21 1112  GLUCAP 110* 148* 125* 96 173*    Recent Results (from the past 240 hour(s))  Resp Panel by RT-PCR (Flu A&B, Covid) Anterior Nasal Swab     Status: None   Collection Time:  10/10/21 10:28 AM   Specimen: Anterior Nasal Swab  Result Value Ref Range Status   SARS Coronavirus 2 by RT PCR NEGATIVE NEGATIVE Final    Comment: (NOTE) SARS-CoV-2 target nucleic acids are NOT DETECTED.  The SARS-CoV-2 RNA is generally detectable in upper respiratory specimens during the acute phase of infection. The lowest concentration of SARS-CoV-2 viral copies this assay can detect is 138 copies/mL. A negative result does not preclude SARS-Cov-2 infection and should not be used as the sole basis for treatment or other patient management decisions. A negative result may occur with  improper specimen collection/handling, submission of specimen  other than nasopharyngeal swab, presence of viral mutation(s) within the areas targeted by this assay, and inadequate number of viral copies(<138 copies/mL). A negative result must be combined with clinical observations, patient history, and epidemiological information. The expected result is Negative.  Fact Sheet for Patients:  EntrepreneurPulse.com.au  Fact Sheet for Healthcare Providers:  IncredibleEmployment.be  This test is no t yet approved or cleared by the [place] FDA and  has been authorized for detection and/or diagnosis of SARS-CoV-2 by FDA under an Emergency Use Authorization (EUA). This EUA will remain  in effect (meaning this test can be used) for the duration of the COVID-19 declaration under Section 564(b)(1) of the Act, 21 U.S.C.section 360bbb-3(b)(1), unless the authorization is terminated  or revoked sooner.       Influenza A by PCR NEGATIVE NEGATIVE Final   Influenza B by PCR NEGATIVE NEGATIVE Final    Comment: (NOTE) The Xpert Xpress SARS-CoV-2/FLU/RSV plus assay is intended as an aid in the diagnosis of influenza from Nasopharyngeal swab specimens and should not be used as a sole basis for treatment. Nasal washings and aspirates are unacceptable for Xpert Xpress SARS-CoV-2/FLU/RSV testing.  Fact Sheet for Patients: EntrepreneurPulse.com.au  Fact Sheet for Healthcare Providers: IncredibleEmployment.be  This test is not yet approved or cleared by the [place] FDA and has been authorized for detection and/or diagnosis of SARS-CoV-2 by FDA under an Emergency Use Authorization (EUA). This EUA will remain in effect (meaning this test can be used) for the duration of the COVID-19 declaration under Section 564(b)(1) of the Act, 21 U.S.C. section 360bbb-3(b)(1), unless the authorization is terminated or revoked.  Performed at [facility], [address] [place], [place]  [address]   Blood Culture (routine x 2)     Status: None (Preliminary result)   Collection Time: 10/10/21 10:35 AM   Specimen: Left Antecubital; Blood  Result Value Ref Range Status   Specimen Description   Final    LEFT ANTECUBITAL BOTTLES DRAWN AEROBIC AND ANAEROBIC   Special Requests Blood Culture adequate volume  Final   Culture   Final    NO GROWTH < 24 HOURS Performed at [facility], [address], [place], [place] [address]    Report Status PENDING  Incomplete  Blood Culture (routine x 2)     Status: None (Preliminary result)   Collection Time: 10/10/21 11:25 AM   Specimen: BLOOD RIGHT WRIST  Result Value Ref Range Status   Specimen Description   Final    BLOOD RIGHT WRIST BOTTLES DRAWN AEROBIC AND ANAEROBIC   Special Requests Blood Culture adequate volume  Final   Culture   Final    NO GROWTH < 24 HOURS Performed at [facility], [address], [place],  [address]    Report Status PENDING  Incomplete     Radiology Studies: NM Hepatobiliary Liver Func  Result Date: 10/11/2021  CLINICAL DATA:  Nausea and abdominal pain. EXAM: NUCLEAR MEDICINE HEPATOBILIARY IMAGING TECHNIQUE: Sequential images of the abdomen were obtained out to 60 minutes following intravenous administration of radiopharmaceutical. RADIOPHARMACEUTICALS:  5.5 mCi Tc-11m Choletec IV COMPARISON:  CT abdomen pelvis from yesterday. FINDINGS: Prompt uptake and biliary excretion of activity by the liver is seen. Gallbladder activity is visualized, consistent with patency of cystic duct. Biliary activity passes into small bowel, consistent with patent common bile duct. IMPRESSION: Normal hepatobiliary scan.  No evidence of cholecystitis. Electronically Signed   By: WTitus DubinM.D.   On: 10/11/2021 08:26   CT Angio Chest/Abd/Pel for Dissection W and/or Wo Contrast  Result Date: 10/10/2021 CLINICAL DATA:  Increased weakness, hypotension and bradycardia. EXAM: CT ANGIOGRAPHY CHEST, ABDOMEN AND PELVIS TECHNIQUE:  Non-contrast CT of the chest was initially obtained. Multidetector CT imaging through the chest, abdomen and pelvis was performed using the standard protocol during bolus administration of intravenous contrast. Multiplanar reconstructed images and MIPs were obtained and reviewed to evaluate the vascular anatomy. RADIATION DOSE REDUCTION: This exam was performed according to the departmental dose-optimization program which includes automated exposure control, adjustment of the mA and/or kV according to patient size and/or use of iterative reconstruction technique. CONTRAST:  758mOMNIPAQUE IOHEXOL 350 MG/ML SOLN COMPARISON:  CT abdomen pelvis, 07/14/2021. Current chest radiograph. Chest CTA, 05/18/2021. FINDINGS: CTA CHEST FINDINGS Cardiovascular: Thoracic aorta is normal in caliber. No dissection. Mild atherosclerosis. No significant stenosis. Arch branch vessels are widely patent. Heart mildly enlarged. Three-vessel coronary artery calcifications. No pericardial effusion. Main pulmonary artery is mildly dilated to 3.5 cm. Mediastinum/Nodes: Small thyroid nodules up to 1.3 cm. No follow-up recommended. No neck base, mediastinal or hilar masses. Several subcentimeter but prominent mediastinal lymph nodes. Mildly enlarged subcarinal lymph node, 1.5 cm in short axis. No hilar adenopathy. Normal trachea. Esophagus mildly distended. Small to moderate-sized hiatal hernia. Lungs/Pleura: Mild peripheral interstitial thickening and areas of mild reticular scarring. Minor dependent subsegmental atelectasis. Small calcified granuloma in the right lower lobe. No evidence of pneumonia or pulmonary edema. No mass or suspicious nodule. No pleural effusion or pneumothorax. Musculoskeletal: No fracture or acute finding. No bone lesion. Skeletal structures are demineralized. No chest wall mass. Review of the MIP images confirms the above findings. CTA ABDOMEN AND PELVIS FINDINGS VASCULAR Aorta: Aorta normal in caliber. Diffuse  atherosclerosis with no significant stenosis. No dissection. Celiac: Patent without evidence of aneurysm, dissection, vasculitis or significant stenosis. SMA: Patent without evidence of aneurysm, dissection, vasculitis or significant stenosis. Renals: Both renal arteries are patent without evidence of aneurysm, dissection, vasculitis, fibromuscular dysplasia or significant stenosis. IMA: Patent without evidence of aneurysm, dissection, vasculitis or significant stenosis. Inflow: Patent without evidence of aneurysm, dissection, vasculitis or significant stenosis. Veins: No obvious venous abnormality within the limitations of this arterial phase study. Review of the MIP images confirms the above findings. NON-VASCULAR Hepatobiliary: Liver normal in size. 3.4 cm right lobe hemangioma, stable. No other liver masses or lesions. Gallbladder is distended. There are scattered wall calcifications. Small dependent gallstone with another stone projecting in the cystic duct. No pericholecystic inflammation. Common bile duct measures up to 9 mm. No common bile duct stone. Pancreas: Unremarkable. No pancreatic ductal dilatation or surrounding inflammatory changes. Spleen: Normal in size without focal abnormality. Adrenals/Urinary Tract: No adrenal nodule. Kidneys normal in size, orientation and position. Mild dilation of the intrarenal collecting systems and proximal to mid ureters, relatively symmetric. No ureteral stones. Bladder moderately distended, otherwise unremarkable. Stomach/Bowel: Rectum significantly distended with  stool, measuring 8.5 cm in diameter. Colon is also distended with stool, which is moderate to markedly increased. Small bowel is normal in caliber. There is no bowel wall thickening or inflammatory changes. Small to moderate hiatal hernia. Stomach otherwise unremarkable. Lymphatic: Prominent gastrohepatic ligament lymph nodes, up to 1 cm in short axis. These are stable. No other enlarged lymph nodes.  Reproductive: Uterus and bilateral adnexa are unremarkable. Other: No abdominal wall hernia or abnormality. No abdominopelvic ascites. Musculoskeletal: Next lucent and sclerotic area in the S1 vertebra that is stable consistent with a benign lesion. No fracture or acute finding. No suspicious bone lesion. Review of the MIP images confirms the above findings. IMPRESSION: 1. Normal caliber thoracoabdominal aorta. No dissection. No evidence of acute aortic syndrome. 2. Aortic atherosclerosis no significant stenosis. Aortic branch vessels are all widely patent. 3. Distended gallbladder with scattered wall calcification, small dependent stone and small stone in the cystic duct, but no adjacent inflammation. Findings are similar to the prior CT. Consider early acute cholecystitis if there are consistent clinical findings. 4. Small to moderate hiatal hernia. 5. Rectum significantly distended with stool. Moderate to marked increase in colonic stool. No bowel obstruction or inflammation. Electronically Signed   By: Lajean Manes M.D.   On: 10/10/2021 14:07   CT Head Wo Contrast  Result Date: 10/10/2021 CLINICAL DATA:  Altered mental status, hypotension, bradycardia EXAM: CT HEAD WITHOUT CONTRAST TECHNIQUE: Contiguous axial images were obtained from the base of the skull through the vertex without intravenous contrast. RADIATION DOSE REDUCTION: This exam was performed according to the departmental dose-optimization program which includes automated exposure control, adjustment of the mA and/or kV according to patient size and/or use of iterative reconstruction technique. COMPARISON:  09/21/2021 FINDINGS: Brain: No acute intracranial findings are seen. There are no signs of bleeding within the cranium. Cortical sulci are prominent. There is decreased density in periventricular white matter. Vascular: Scattered arterial calcifications are seen. Skull: There is mild hyperostosis frontalis interna. Sinuses/Orbits: Defects in  the medial walls of maxillary sinuses may be postsurgical. There are no air-fluid levels in the visualized paranasal sinuses. Other: None. IMPRESSION: No acute intracranial findings are seen in noncontrast CT brain. Atrophy. Small vessel disease. Electronically Signed   By: Elmer Picker M.D.   On: 10/10/2021 13:54   DG Chest Port 1 View  Result Date: 10/10/2021 CLINICAL DATA:  Possible sepsis EXAM: PORTABLE CHEST 1 VIEW COMPARISON:  Previous studies including the examination of 09/21/2021 FINDINGS: Transverse diameter of heart is slightly increased. There are no signs of pulmonary edema or focal pulmonary consolidation. Left lateral CP angle is indistinct with no interval change. There is no pneumothorax. Degenerative changes are noted in both shoulders. IMPRESSION: There are no new infiltrates or signs of pulmonary edema. Electronically Signed   By: Elmer Picker M.D.   On: 10/10/2021 10:52    Scheduled Meds:  amiodarone  200 mg Oral Daily   ARIPiprazole  2 mg Oral Daily   atorvastatin  40 mg Oral QHS   busPIRone  7.5 mg Oral BID   clonazePAM  0.5 mg Oral TID   DULoxetine  60 mg Oral Daily   heparin  5,000 Units Subcutaneous Q8H   insulin aspart  0-9 Units Subcutaneous TID WC   insulin aspart  3 Units Subcutaneous TID WC   insulin detemir  12 Units Subcutaneous Q2200   latanoprost  1 drop Both Eyes QHS   linagliptin  5 mg Oral Daily   lubiprostone  8 mcg  Oral Daily   metoprolol tartrate  12.5 mg Oral BID   midodrine  10 mg Oral TID with meals   milk and molasses  1 enema Rectal Once   oxybutynin  5 mg Oral Daily   pantoprazole  40 mg Oral BID AC   polyethylene glycol  17 g Oral Daily   risperiDONE  1 mg Oral BID   risperiDONE  2 mg Oral QHS   sorbitol, milk of mag, mineral oil, glycerin (SMOG) enema  960 mL Rectal Once   Continuous Infusions:  lactated ringers 125 mL/hr at 10/11/21 0451   meropenem (MERREM) IV 1 g (10/11/21 0249)   vancomycin       LOS: 1 day    Time spent: 36 mins  Bayron Dalto Wynetta Emery, MD How to contact the [facility] Attending or Consulting provider Power or covering provider during after hours [place], for this patient?  Check the care team in [facility] and look for a) attending/consulting [facility] provider listed and b) the [facility] team listed Log into [email or web address] and use [place]'s universal password to access. If you do not have the password, please contact the hospital operator. Locate the [facility] provider you are looking for under [facility] and page to a number that you can be directly reached. If you still have difficulty reaching the provider, please page the [facility] (Director on Call) for the Hospitalists listed on amion for assistance.  10/11/2021, 11:44 AM

## 2021-10-12 DIAGNOSIS — R627 Adult failure to thrive: Secondary | ICD-10-CM | POA: Diagnosis not present

## 2021-10-12 DIAGNOSIS — K8020 Calculus of gallbladder without cholecystitis without obstruction: Secondary | ICD-10-CM | POA: Diagnosis not present

## 2021-10-12 DIAGNOSIS — I959 Hypotension, unspecified: Secondary | ICD-10-CM | POA: Diagnosis not present

## 2021-10-12 LAB — GLUCOSE, CAPILLARY
Glucose-Capillary: 110 mg/dL — ABNORMAL HIGH (ref 70–99)
Glucose-Capillary: 85 mg/dL (ref 70–99)
Glucose-Capillary: 220 mg/dL — ABNORMAL HIGH (ref 70–99)
Glucose-Capillary: 83 mg/dL (ref 70–99)
Glucose-Capillary: 126 mg/dL — ABNORMAL HIGH (ref 70–99)

## 2021-10-12 LAB — COMPREHENSIVE METABOLIC PANEL
ALT: 438 U/L — ABNORMAL HIGH (ref 0–44)
AST: 480 U/L — ABNORMAL HIGH (ref 15–41)
Albumin: 2.5 g/dL — ABNORMAL LOW (ref 3.5–5.0)
Alkaline Phosphatase: 385 U/L — ABNORMAL HIGH (ref 38–126)
Anion gap: 5 (ref 5–15)
BUN: 7 mg/dL — ABNORMAL LOW (ref 8–23)
CO2: 27 mmol/L (ref 22–32)
Calcium: 8.1 mg/dL — ABNORMAL LOW (ref 8.9–10.3)
Chloride: 106 mmol/L (ref 98–111)
Creatinine, Ser: 0.56 mg/dL (ref 0.44–1.00)
GFR, Estimated: >60 mL/min (ref >60)
Glucose, Bld: 83 mg/dL (ref 70–99)
Potassium: 3.8 mmol/L (ref 3.5–5.1)
Sodium: 138 mmol/L (ref 135–145)
Total Bilirubin: 1.2 mg/dL (ref 0.3–1.2)
Total Protein: 5.6 g/dL — ABNORMAL LOW (ref 6.5–8.1)

## 2021-10-12 LAB — CBC WITH DIFFERENTIAL/PLATELET
Abs Immature Granulocytes: 0.01 10*3/uL (ref 0.00–0.07)
Basophils Absolute: 0 10*3/uL (ref 0.0–0.1)
Basophils Relative: 1 %
Eosinophils Absolute: 0.3 10*3/uL (ref 0.0–0.5)
Eosinophils Relative: 8 %
HCT: 31.5 % — ABNORMAL LOW (ref 36.0–46.0)
Hemoglobin: 10.1 g/dL — ABNORMAL LOW (ref 12.0–15.0)
Immature Granulocytes: 0 %
Lymphocytes Relative: 40 %
Lymphs Abs: 1.5 10*3/uL (ref 0.7–4.0)
MCH: 27.7 pg (ref 26.0–34.0)
MCHC: 32.1 g/dL (ref 30.0–36.0)
MCV: 86.5 fL (ref 80.0–100.0)
Monocytes Absolute: 0.3 10*3/uL (ref 0.1–1.0)
Monocytes Relative: 7 %
Neutro Abs: 1.6 10*3/uL — ABNORMAL LOW (ref 1.7–7.7)
Neutrophils Relative %: 44 %
Platelets: 209 10*3/uL (ref 150–400)
RBC: 3.64 MIL/uL — ABNORMAL LOW (ref 3.87–5.11)
RDW: 15.2 % (ref 11.5–15.5)
WBC: 3.7 10*3/uL — ABNORMAL LOW (ref 4.0–10.5)
nRBC: 0 % (ref 0.0–0.2)

## 2021-10-12 NOTE — Evaluation (Signed)
Physical Therapy Evaluation Patient Details Name: [name] MRN: [id number] DOB: 1945/07/01 Today's Date: 10/12/2021  History of Present Illness  [name] is a 76 year old female well-known to the medical service given frequent recent hospitalizations and ongoing failure to thrive was recently discharged from the hospital 09/19/2021.  Seen in the ED 2 days later 09/21/2021.  She had discharged to SNF.  She has a history of schizophrenia, hypertension, type 2 diabetes mellitus, chronic anxiety with benzodiazepine dependence, chronic pain and opioid dependence, paroxysmal atrial fibrillation, DVT, multiple hospitalizations for sepsis and shock, chronic hypotension on midodrine, GERD and diastolic heart failure.  Recently treated for acute tracheobronchitis and also noted to have hypertrophic obstructive cardiomyopathy.  She has a history of stricture and stenosis of the esophagus as well.     She presented to the [facility] office today for scheduled outpatient visit.  She was noted to be severely hypotensive, lethargic and complaining of abdominal pain.  EMS was called to take her to the emergency department.  She was noted to have a presentation of septic shock.  She was hypotensive, appeared clinically dry and complained of abdominal pain. CT abdomen was notable for cholelithiasis. Surgery recommended she have a HIDA scan.  She was resuscitated with fluids and antibiotics.  She is being admitted for further management.   Clinical Impression  Patient demonstrates labored movement for sitting up at bedside, able to take a few steps at bedside requiring Mod assist and tolerated sitting up in chair after therapy - RN notified.  Patient will benefit from continued skilled physical therapy in hospital and recommended venue below to increase strength, balance, endurance for safe ADLs and gait.        Recommendations for follow up therapy are one component of a multi-disciplinary discharge planning  process, led by the attending physician.  Recommendations may be updated based on patient status, additional functional criteria and insurance authorization.  Follow Up Recommendations Skilled nursing-short term rehab (<3 hours/day) Can patient physically be transported by private vehicle: Yes    Assistance Recommended at Discharge Intermittent Supervision/Assistance  Patient can return home with the following  A lot of help with walking and/or transfers;A little help with bathing/dressing/bathroom;Assistance with cooking/housework;Assist for transportation;Help with stairs or ramp for entrance    Equipment Recommendations None recommended by PT  Recommendations for Other Services       Functional Status Assessment Patient has had a recent decline in their functional status and demonstrates the ability to make significant improvements in function in a reasonable and predictable amount of time.     Precautions / Restrictions Precautions Precautions: Fall Restrictions Weight Bearing Restrictions: No      Mobility  Bed Mobility Overal bed mobility: Needs Assistance Bed Mobility: Supine to Sit     Supine to sit: Min assist, Mod assist     General bed mobility comments: increased time, labored movement    Transfers Overall transfer level: Needs assistance Equipment used: Rolling walker (2 wheels) Transfers: Sit to/from Stand, Bed to chair/wheelchair/BSC Sit to Stand: Min assist, Mod assist   Step pivot transfers: Min assist, Mod assist       General transfer comment: increased time, labored movement    Ambulation/Gait Ambulation/Gait assistance: Mod assist Gait Distance (Feet): 5 Feet Assistive device: Rolling walker (2 wheels) Gait Pattern/deviations: Decreased step length - left, Decreased step length - right, Decreased stride length, Scissoring Gait velocity: slow     General Gait Details: limited to a few slow labored side  steps with scissoring of legs once  fatigued  Stairs            Wheelchair Mobility    Modified Rankin (Stroke Patients Only)       Balance Overall balance assessment: Needs assistance Sitting-balance support: Feet supported, No upper extremity supported Sitting balance-Leahy Scale: Fair Sitting balance - Comments: seated at EOB   Standing balance support: During functional activity, Bilateral upper extremity supported, Reliant on assistive device for balance Standing balance-Leahy Scale: Poor Standing balance comment: fair/poor using RW                             Pertinent Vitals/Pain Pain Assessment Pain Assessment: No/denies pain    Home Living Family/patient expects to be discharged to:: Assisted living                 Home Equipment: Conservation officer, nature (2 wheels);Wheelchair - manual;Hospital bed      Prior Function Prior Level of Function : Needs assist       Physical Assist : Mobility (physical);ADLs (physical) Mobility (physical): Bed mobility;Transfers;Gait;Stairs   Mobility Comments: Assisted for transfers and a few steps using RW during transfers ADLs Comments: asissted by ALF staff. Pt reports independence with toileting, grooming, and eating.     Hand Dominance   Dominant Hand: Right    Extremity/Trunk Assessment   Upper Extremity Assessment Upper Extremity Assessment: Generalized weakness    Lower Extremity Assessment Lower Extremity Assessment: Generalized weakness    Cervical / Trunk Assessment Cervical / Trunk Assessment: Normal  Communication   Communication: No difficulties  Cognition Arousal/Alertness: Awake/alert Behavior During Therapy: WFL for tasks assessed/performed, Anxious Overall Cognitive Status: Within Functional Limits for tasks assessed                                          General Comments      Exercises     Assessment/Plan    PT Assessment Patient needs continued PT services  PT Problem List Decreased  strength;Decreased activity tolerance;Decreased balance;Decreased mobility       PT Treatment Interventions DME instruction;Gait training;Stair training;Functional mobility training;Therapeutic activities;Therapeutic exercise;Balance training;Patient/family education    PT Goals (Current goals can be found in the Care Plan section)  Acute Rehab PT Goals Patient Stated Goal: return home PT Goal Formulation: With patient Time For Goal Achievement: 10/26/21 Potential to Achieve Goals: Good    Frequency Min 2X/week     Co-evaluation               AM-PAC PT "6 Clicks" Mobility  Outcome Measure Help needed turning from your back to your side while in a flat bed without using bedrails?: A Little Help needed moving from lying on your back to sitting on the side of a flat bed without using bedrails?: A Lot Help needed moving to and from a bed to a chair (including a wheelchair)?: A Lot Help needed standing up from a chair using your arms (e.g., wheelchair or bedside chair)?: A Lot Help needed to walk in hospital room?: A Lot Help needed climbing 3-5 steps with a railing? : Total 6 Click Score: 12    End of Session   Activity Tolerance: Patient tolerated treatment well;Patient limited by fatigue Patient left: in chair;with call bell/phone within reach Nurse Communication: Mobility status PT Visit Diagnosis: Unsteadiness on feet (R26.81);Other  abnormalities of gait and mobility (R26.89);Muscle weakness (generalized) (M62.81)    Time: 1110-1130 PT Time Calculation (min) (ACUTE ONLY): 20 min   Charges:   PT Evaluation $PT Eval Moderate Complexity: 1 Mod PT Treatments $Therapeutic Activity: 8-22 mins        3:06 PM, 10/12/21 Lonell Grandchild, MPT Physical Therapist with [facility] [phone] office [phone] mobile phone

## 2021-10-12 NOTE — Progress Notes (Signed)
PROGRESS NOTE  [name] KCL:[id number] DOB: 1945/04/29 DOA: 10/10/2021 PCP: Moss Mc, NP  Brief History:  76 year old female well-known to the medical service given frequent recent hospitalizations and ongoing failure to thrive was recently discharged from the hospital 09/19/2021.  Seen in the ED 2 days later 09/21/2021.  She had discharged to SNF.  She has a history of schizophrenia, hypertension, type 2 diabetes mellitus, chronic anxiety with benzodiazepine dependence, chronic pain and opioid dependence, paroxysmal atrial fibrillation, DVT, multiple hospitalizations for sepsis and shock, chronic hypotension on midodrine, GERD and diastolic heart failure.  Recently treated for acute tracheobronchitis and also noted to have hypertrophic obstructive cardiomyopathy.  She has a history of stricture and stenosis of the esophagus as well.  She presented to the [place] office today for scheduled outpatient visit.  She was noted to be severely hypotensive, lethargic and complaining of abdominal pain.  EMS was called to take her to the emergency department.  She was noted to have a presentation of septic shock.  She was hypotensive, appeared clinically dry and complained of abdominal pain. CT abdomen was notable for cholelithiasis. Surgery recommended she have a HIDA scan.  She was resuscitated with fluids and antibiotics.  She is being admitted for further management.       Assessment and Plan: Hypotension -initially felt to be septic shock -improved with fluid resuscitation -urine and blood cultures neg -CTA chest neg for consolidation, infiltrates -remains afebrile -now felt to be due to volume depletion -d/c antibiotics and monitor -PCT<0.10  Calculus of gallbladder without cholecystitis without obstruction --CTA abd showed distended GB with stones fortunately HIDA scan was normal --appreciate surgery consult, no need for cholecystectomy  Schizophrenia ([place]) Continue   Risperdal, Remeron, Cymbalta--restarted Continue clonazepam--restarted   Uncontrolled type 2 diabetes mellitus with hyperglycemia, with long-term current use of insulin (HCC) Continue Levemir 10 units hx Continue NovoLog sliding scale 03/22/2021 hemoglobin A1c 7.1 08/19/21 A1C--6.2 CBGs largely controlled during the hospitalization   GERD (gastroesophageal reflux disease) - Continue Protonix   Acute respiratory failure with hypoxia (HCC) - Secondary to pulmonary edema - Chest x-ray shows interstitial pulmonary edema versus atypical infection- patient was discharged July 23 after admission from pneumonia, those infiltrates should be resolved by now - trach aspirate culture S.pneumoniae -appreciate PCCM--discussed with Dr. El[place] Soho --extubated 09/15/21 afternoon --MRSA screen is positive --now remains stable on RA   Chronic diastolic CHF (congestive heart failure) ([place]) -appears clinically euvolemic -hold lasix -08/2021 echo-Repeat Echo EF 65-70%, +HOCM, no WMA   Lactic Acidosis Sepsis ruled out Likely delayed clearance and volume depletion Improved with IVF   HOCM (hypertrophic obstructive cardiomyopathy) ([place]) Avoid hypotension Use BB for afib neosynephrine if hypotensive outpt cardiology follow up Seen by cardiology during last in Aug 2023 hospitalization   DVT, lower extremity, recurrent ([place]) On [place] lovenox currently>>plan to transition back to apixaban    Stricture and stenosis of esophagus 07/23/2021 EGD--showed food in the middle third and lower third of the esophagus.  The patient was subsequently debated during the procedure secondary to aspiration risk.  90% of the retained food was removed with 10% pushed into the stomach.  A distal esophageal stricture was noted. -Continue full liquid diet once extubated -Continues to have dysphagia -07/25/21 barium swallow>>Nonspecific esophageal dysmotility disorder, with termination of primary peristaltic waves in the mid esophagus,  and tertiary contractions in the esophagus Continue IV pantoprazole twice daily Tolerating dys 1 diet   Opioid dependence (  [place]) [place] reviewed Norco 10/325, #90, last refilled on 09/01/21, 08/03/21 Clonazepam 0.5 mg, #90, last filled on 09/08/21, 08/26/21   Essential hypertension Holding dilitazem and metoprolol succinate due to hypotension initially --restart metoprolol succinate now that BPs improving   Goals of care, counseling/discussion Discussed with patient's Legal Guardian, [name] (Aug admit) --agrees with DNR   Pressure injury of skin Stage 1 on buttock and left hip Present on admission Local care   IV infiltration IV with levophed infiltrated in right arm --given phentolamine --no signs of necrosis presently   Acute metabolic encephalopathy - obtunded in ED initially - Patient was hypotensive which is likely contributing but also hypoxic as she was not protecting her airway at 78% - Initial pH 7.3, PCO2 33 -  altered mental status secondary to hypoxia and hypotension - overall improved, back to baseline   Paroxysmal atrial fibrillation (HCC) Continue apixaban Continue amiodarone Continue metoprolol        Family Communication:  no  Family at bedside  Consultants:  none  Code Status:  DNR  DVT Prophylaxis:  apixaban   Procedures: As Listed in Progress Note Above  Antibiotics: Vanc 9/18 Cefepime 9/18>>9/19 Doxy 9/19>>9/20     Subjective: Patient denies fevers, chills, headache, chest pain, dyspnea, nausea, vomiting, diarrhea, abdominal pain, dysuria, hematuria, hematochezia, and melena.   Objective: Vitals:   10/11/21 1839 10/11/21 2025 10/11/21 2252 10/12/21 0332  BP: (!) 114/50 117/73 (!) 112/57 112/70  Pulse: 67 61 64 89  Resp:  19    Temp:  (!) 97.2 F (36.2 C)  (!) 97 F (36.1 C)  TempSrc:      SpO2:  96%  100%  Weight:      Height:        Intake/Output Summary (Last 24 hours) at 10/12/2021 1809 Last data filed at  10/12/2021 1300 Gross per 24 hour  Intake 1319.32 ml  Output 700 ml  Net 619.32 ml   Weight change:  Exam:  General:  Pt is alert, follows commands appropriately, not in acute distress HEENT: No icterus, No thrush, No neck mass, [place]/AT Cardiovascular: RRR, S1/S2, no rubs, no gallops Respiratory: CTA bilaterally, no wheezing, no crackles, no rhonchi Abdomen: Soft/+BS, non tender, non distended, no guarding Extremities: No edema, No lymphangitis, No petechiae, No rashes, no synovitis   Data Reviewed: I have personally reviewed following labs and imaging studies Basic Metabolic Panel: Recent Labs  Lab 10/10/21 1028 10/10/21 1641 10/11/21 0505 10/12/21 0548  NA 136  --  138 138  K 3.5  --  3.2* 3.8  CL 101  --  107 106  CO2 27  --  26 27  GLUCOSE 210*  --  91 83  BUN 13  --  9 7*  CREATININE 0.97 0.69 0.55 0.56  CALCIUM 8.6*  --  7.7* 8.1*  MG  --   --  1.7  --    Liver Function Tests: Recent Labs  Lab 10/10/21 1028 10/11/21 0505 10/12/21 0548  AST 13* 29 480*  ALT 10 13 438*  ALKPHOS 106 98 385*  BILITOT 0.4 0.5 1.2  PROT 6.7 5.1* 5.6*  ALBUMIN 3.4* 2.3* 2.5*   No results for input(s): "LIPASE", "AMYLASE" in the last 168 hours. No results for input(s): "AMMONIA" in the last 168 hours. Coagulation Profile: Recent Labs  Lab 10/10/21 1028  INR 1.5*   CBC: Recent Labs  Lab 10/10/21 1028 10/10/21 1641 10/11/21 0505 10/12/21 0548  WBC 9.4 9.4 6.9 3.7*  NEUTROABS  7.4  --  4.3 1.6*  HGB 11.9* 10.8* 9.3* 10.1*  HCT 36.8 33.9* 28.8* 31.5*  MCV 86.6 86.9 86.0 86.5  PLT 286 249 197 209   Cardiac Enzymes: No results for input(s): "CKTOTAL", "CKMB", "CKMBINDEX", "TROPONINI" in the last 168 hours. BNP: Invalid input(s): "POCBNP" CBG: Recent Labs  Lab 10/11/21 2255 10/12/21 0338 10/12/21 0704 10/12/21 1140 10/12/21 1616  GLUCAP 163* 110* 85 220* 83   HbA1C: No results for input(s): "HGBA1C" in the last 72 hours. Urine analysis:    Component Value  Date/Time   COLORURINE YELLOW 10/10/2021 1028   APPEARANCEUR CLEAR 10/10/2021 1028   APPEARANCEUR Cloudy (A) 05/01/2016 1525   LABSPEC 1.010 10/10/2021 1028   PHURINE 7.0 10/10/2021 1028   GLUCOSEU NEGATIVE 10/10/2021 1028   HGBUR NEGATIVE 10/10/2021 1028   BILIRUBINUR NEGATIVE 10/10/2021 1028   BILIRUBINUR Negative 05/01/2016 1525   KETONESUR NEGATIVE 10/10/2021 1028   PROTEINUR NEGATIVE 10/10/2021 1028   UROBILINOGEN 0.2 09/14/2014 1703   NITRITE NEGATIVE 10/10/2021 1028   LEUKOCYTESUR SMALL (A) 10/10/2021 1028   Sepsis Labs: '@LABRCNTIP'$ (procalcitonin:4,lacticidven:4) ) Recent Results (from the past 240 hour(s))  Resp Panel by RT-PCR (Flu A&B, Covid) Anterior Nasal Swab     Status: None   Collection Time: 10/10/21 10:28 AM   Specimen: Anterior Nasal Swab  Result Value Ref Range Status   SARS Coronavirus 2 by RT PCR NEGATIVE NEGATIVE Final    Comment: (NOTE) SARS-CoV-2 target nucleic acids are NOT DETECTED.  The SARS-CoV-2 RNA is generally detectable in upper respiratory specimens during the acute phase of infection. The lowest concentration of SARS-CoV-2 viral copies this assay can detect is 138 copies/mL. A negative result does not preclude SARS-Cov-2 infection and should not be used as the sole basis for treatment or other patient management decisions. A negative result may occur with  improper specimen collection/handling, submission of specimen other than nasopharyngeal swab, presence of viral mutation(s) within the areas targeted by this assay, and inadequate number of viral copies(<138 copies/mL). A negative result must be combined with clinical observations, patient history, and epidemiological information. The expected result is Negative.  Fact Sheet for Patients:  EntrepreneurPulse.com.au  Fact Sheet for Healthcare Providers:  IncredibleEmployment.be  This test is no t yet approved or cleared by the [place] FDA and   has been authorized for detection and/or diagnosis of SARS-CoV-2 by FDA under an Emergency Use Authorization (EUA). This EUA will remain  in effect (meaning this test can be used) for the duration of the COVID-19 declaration under Section 564(b)(1) of the Act, 21 U.S.C.section 360bbb-3(b)(1), unless the authorization is terminated  or revoked sooner.       Influenza A by PCR NEGATIVE NEGATIVE Final   Influenza B by PCR NEGATIVE NEGATIVE Final    Comment: (NOTE) The Xpert Xpress SARS-CoV-2/FLU/RSV plus assay is intended as an aid in the diagnosis of influenza from Nasopharyngeal swab specimens and should not be used as a sole basis for treatment. Nasal washings and aspirates are unacceptable for Xpert Xpress SARS-CoV-2/FLU/RSV testing.  Fact Sheet for Patients: EntrepreneurPulse.com.au  Fact Sheet for Healthcare Providers: IncredibleEmployment.be  This test is not yet approved or cleared by the [place] FDA and has been authorized for detection and/or diagnosis of SARS-CoV-2 by FDA under an Emergency Use Authorization (EUA). This EUA will remain in effect (meaning this test can be used) for the duration of the COVID-19 declaration under Section 564(b)(1) of the Act, 21 U.S.C. section 360bbb-3(b)(1), unless the authorization is terminated or revoked.  Performed at [facility], [address] [place], [place] [address]   Urine Culture     Status: None   Collection Time: 10/10/21 10:28 AM   Specimen: Urine, Catheterized  Result Value Ref Range Status   Specimen Description   Final    URINE, CATHETERIZED Performed at [facility], [address] [place], [place] [address]    Special Requests   Final    NONE Performed at [facility], [address], [place], [place] [address]    Culture   Final    NO GROWTH Performed at [facility], [place] [address] [place], [place] [address]    Report Status 10/11/2021 FINAL  Final  Blood  Culture (routine x 2)     Status: None (Preliminary result)   Collection Time: 10/10/21 10:35 AM   Specimen: Left Antecubital; Blood  Result Value Ref Range Status   Specimen Description   Final    LEFT ANTECUBITAL BOTTLES DRAWN AEROBIC AND ANAEROBIC   Special Requests Blood Culture adequate volume  Final   Culture   Final    NO GROWTH 2 DAYS Performed at [facility], [address] [place], [place] [address]    Report Status PENDING  Incomplete  Blood Culture (routine x 2)     Status: None (Preliminary result)   Collection Time: 10/10/21 11:25 AM   Specimen: BLOOD RIGHT WRIST  Result Value Ref Range Status   Specimen Description   Final    BLOOD RIGHT WRIST BOTTLES DRAWN AEROBIC AND ANAEROBIC   Special Requests Blood Culture adequate volume  Final   Culture   Final    NO GROWTH 2 DAYS Performed at [facility], [address], [place],  [address]    Report Status PENDING  Incomplete     Scheduled Meds:  amiodarone  200 mg Oral Daily   apixaban  5 mg Oral BID   ARIPiprazole  2 mg Oral Daily   busPIRone  7.5 mg Oral BID   clonazePAM  0.5 mg Oral TID   doxycycline  100 mg Oral Q12H   DULoxetine  60 mg Oral Daily   guaiFENesin  600 mg Oral BID   insulin aspart  0-9 Units Subcutaneous TID WC   insulin aspart  3 Units Subcutaneous TID WC   insulin detemir  12 Units Subcutaneous Q2200   latanoprost  1 drop Both Eyes QHS   linagliptin  5 mg Oral Daily   lubiprostone  8 mcg Oral Daily   metoprolol tartrate  12.5 mg Oral BID   midodrine  10 mg Oral TID with meals   oxybutynin  5 mg Oral Daily   pantoprazole  40 mg Oral BID AC   polyethylene glycol  17 g Oral Daily   risperiDONE  1 mg Oral BID   risperiDONE  2 mg Oral QHS   Continuous Infusions:  Procedures/Studies: NM Hepatobiliary Liver Func  Result Date: 10/11/2021 CLINICAL DATA:  Nausea and abdominal pain. EXAM: NUCLEAR MEDICINE HEPATOBILIARY IMAGING TECHNIQUE: Sequential images of the abdomen were obtained out  to 60 minutes following intravenous administration of radiopharmaceutical. RADIOPHARMACEUTICALS:  5.5 mCi Tc-56m Choletec IV COMPARISON:  CT abdomen pelvis from yesterday. FINDINGS: Prompt uptake and biliary excretion of activity by the liver is seen. Gallbladder activity is visualized, consistent with patency of cystic duct. Biliary activity passes into small bowel, consistent with patent common bile duct. IMPRESSION: Normal hepatobiliary scan.  No evidence of cholecystitis. Electronically Signed   By: WTitus Dubin  M.D.   On: 10/11/2021 08:26   CT Angio Chest/Abd/Pel for Dissection W and/or Wo Contrast  Result Date: 10/10/2021 CLINICAL DATA:  Increased weakness, hypotension and bradycardia. EXAM: CT ANGIOGRAPHY CHEST, ABDOMEN AND PELVIS TECHNIQUE: Non-contrast CT of the chest was initially obtained. Multidetector CT imaging through the chest, abdomen and pelvis was performed using the standard protocol during bolus administration of intravenous contrast. Multiplanar reconstructed images and MIPs were obtained and reviewed to evaluate the vascular anatomy. RADIATION DOSE REDUCTION: This exam was performed according to the departmental dose-optimization program which includes automated exposure control, adjustment of the mA and/or kV according to patient size and/or use of iterative reconstruction technique. CONTRAST:  21m OMNIPAQUE IOHEXOL 350 MG/ML SOLN COMPARISON:  CT abdomen pelvis, 07/14/2021. Current chest radiograph. Chest CTA, 05/18/2021. FINDINGS: CTA CHEST FINDINGS Cardiovascular: Thoracic aorta is normal in caliber. No dissection. Mild atherosclerosis. No significant stenosis. Arch branch vessels are widely patent. Heart mildly enlarged. Three-vessel coronary artery calcifications. No pericardial effusion. Main pulmonary artery is mildly dilated to 3.5 cm. Mediastinum/Nodes: Small thyroid nodules up to 1.3 cm. No follow-up recommended. No neck base, mediastinal or hilar masses. Several  subcentimeter but prominent mediastinal lymph nodes. Mildly enlarged subcarinal lymph node, 1.5 cm in short axis. No hilar adenopathy. Normal trachea. Esophagus mildly distended. Small to moderate-sized hiatal hernia. Lungs/Pleura: Mild peripheral interstitial thickening and areas of mild reticular scarring. Minor dependent subsegmental atelectasis. Small calcified granuloma in the right lower lobe. No evidence of pneumonia or pulmonary edema. No mass or suspicious nodule. No pleural effusion or pneumothorax. Musculoskeletal: No fracture or acute finding. No bone lesion. Skeletal structures are demineralized. No chest wall mass. Review of the MIP images confirms the above findings. CTA ABDOMEN AND PELVIS FINDINGS VASCULAR Aorta: Aorta normal in caliber. Diffuse atherosclerosis with no significant stenosis. No dissection. Celiac: Patent without evidence of aneurysm, dissection, vasculitis or significant stenosis. SMA: Patent without evidence of aneurysm, dissection, vasculitis or significant stenosis. Renals: Both renal arteries are patent without evidence of aneurysm, dissection, vasculitis, fibromuscular dysplasia or significant stenosis. IMA: Patent without evidence of aneurysm, dissection, vasculitis or significant stenosis. Inflow: Patent without evidence of aneurysm, dissection, vasculitis or significant stenosis. Veins: No obvious venous abnormality within the limitations of this arterial phase study. Review of the MIP images confirms the above findings. NON-VASCULAR Hepatobiliary: Liver normal in size. 3.4 cm right lobe hemangioma, stable. No other liver masses or lesions. Gallbladder is distended. There are scattered wall calcifications. Small dependent gallstone with another stone projecting in the cystic duct. No pericholecystic inflammation. Common bile duct measures up to 9 mm. No common bile duct stone. Pancreas: Unremarkable. No pancreatic ductal dilatation or surrounding inflammatory changes. Spleen:  Normal in size without focal abnormality. Adrenals/Urinary Tract: No adrenal nodule. Kidneys normal in size, orientation and position. Mild dilation of the intrarenal collecting systems and proximal to mid ureters, relatively symmetric. No ureteral stones. Bladder moderately distended, otherwise unremarkable. Stomach/Bowel: Rectum significantly distended with stool, measuring 8.5 cm in diameter. Colon is also distended with stool, which is moderate to markedly increased. Small bowel is normal in caliber. There is no bowel wall thickening or inflammatory changes. Small to moderate hiatal hernia. Stomach otherwise unremarkable. Lymphatic: Prominent gastrohepatic ligament lymph nodes, up to 1 cm in short axis. These are stable. No other enlarged lymph nodes. Reproductive: Uterus and bilateral adnexa are unremarkable. Other: No abdominal wall hernia or abnormality. No abdominopelvic ascites. Musculoskeletal: Next lucent and sclerotic area in the S1 vertebra that is stable consistent with a benign  lesion. No fracture or acute finding. No suspicious bone lesion. Review of the MIP images confirms the above findings. IMPRESSION: 1. Normal caliber thoracoabdominal aorta. No dissection. No evidence of acute aortic syndrome. 2. Aortic atherosclerosis no significant stenosis. Aortic branch vessels are all widely patent. 3. Distended gallbladder with scattered wall calcification, small dependent stone and small stone in the cystic duct, but no adjacent inflammation. Findings are similar to the prior CT. Consider early acute cholecystitis if there are consistent clinical findings. 4. Small to moderate hiatal hernia. 5. Rectum significantly distended with stool. Moderate to marked increase in colonic stool. No bowel obstruction or inflammation. Electronically Signed   By: Lajean Manes M.D.   On: 10/10/2021 14:07   CT Head Wo Contrast  Result Date: 10/10/2021 CLINICAL DATA:  Altered mental status, hypotension, bradycardia  EXAM: CT HEAD WITHOUT CONTRAST TECHNIQUE: Contiguous axial images were obtained from the base of the skull through the vertex without intravenous contrast. RADIATION DOSE REDUCTION: This exam was performed according to the departmental dose-optimization program which includes automated exposure control, adjustment of the mA and/or kV according to patient size and/or use of iterative reconstruction technique. COMPARISON:  09/21/2021 FINDINGS: Brain: No acute intracranial findings are seen. There are no signs of bleeding within the cranium. Cortical sulci are prominent. There is decreased density in periventricular white matter. Vascular: Scattered arterial calcifications are seen. Skull: There is mild hyperostosis frontalis interna. Sinuses/Orbits: Defects in the medial walls of maxillary sinuses may be postsurgical. There are no air-fluid levels in the visualized paranasal sinuses. Other: None. IMPRESSION: No acute intracranial findings are seen in noncontrast CT brain. Atrophy. Small vessel disease. Electronically Signed   By: Elmer Picker M.D.   On: 10/10/2021 13:54   DG Chest Port 1 View  Result Date: 10/10/2021 CLINICAL DATA:  Possible sepsis EXAM: PORTABLE CHEST 1 VIEW COMPARISON:  Previous studies including the examination of 09/21/2021 FINDINGS: Transverse diameter of heart is slightly increased. There are no signs of pulmonary edema or focal pulmonary consolidation. Left lateral CP angle is indistinct with no interval change. There is no pneumothorax. Degenerative changes are noted in both shoulders. IMPRESSION: There are no new infiltrates or signs of pulmonary edema. Electronically Signed   By: Elmer Picker M.D.   On: 10/10/2021 10:52   MR BRAIN WO CONTRAST  Result Date: 09/21/2021 CLINICAL DATA:  Mental status change, unknown cause EXAM: MRI HEAD WITHOUT CONTRAST TECHNIQUE: Multiplanar, multiecho pulse sequences of the brain and surrounding structures were obtained without  intravenous contrast. COMPARISON:  2018 FINDINGS: Brain: There is no acute infarction or intracranial hemorrhage. There is no intracranial mass, mass effect, or edema. There is no hydrocephalus or extra-axial fluid collection. Prominence of the ventricles and sulci reflects similar parenchymal volume loss. Foci of susceptibility the left corona radiata and thalamus likely reflect chronic microhemorrhages. Patchy and confluent areas of T2 hyperintensity in the supratentorial white matter are nonspecific probably reflect similar moderate chronic microvascular ischemic changes there are chronic small vessel infarcts the central white matter bilaterally. Vascular: Major vessel flow voids at the skull base are preserved. Skull and upper cervical spine: Normal marrow signal is preserved. Sinuses/Orbits: Evidence of prior sinonasal surgery. Orbits are unremarkable. Other: Sella is unremarkable.  Trace mastoid fluid opacification. IMPRESSION: No acute infarction, hemorrhage, or mass. Similar chronic microvascular ischemic changes, small infarcts, and parenchymal volume loss. Electronically Signed   By: Macy Mis M.D.   On: 09/21/2021 13:31   CT Head Wo Contrast  Result Date: 09/21/2021 CLINICAL  DATA:  Altered mental status, weakness. EXAM: CT HEAD WITHOUT CONTRAST TECHNIQUE: Contiguous axial images were obtained from the base of the skull through the vertex without intravenous contrast. RADIATION DOSE REDUCTION: This exam was performed according to the departmental dose-optimization program which includes automated exposure control, adjustment of the mA and/or kV according to patient size and/or use of iterative reconstruction technique. COMPARISON:  September 13, 2021. FINDINGS: Brain: Mild chronic ischemic white matter disease is noted. No mass effect or midline shift is noted. Ventricular size is within normal limits. There is no evidence of mass lesion, hemorrhage or acute infarction. Vascular: No hyperdense  vessel or unexpected calcification. Skull: Normal. Negative for fracture or focal lesion. Sinuses/Orbits: No acute finding. Other: None. IMPRESSION: No acute intracranial abnormality seen. Electronically Signed   By: Marijo Conception M.D.   On: 09/21/2021 10:42   DG Chest Port 1 View  Result Date: 09/21/2021 CLINICAL DATA:  Questionable sepsis - evaluate for abnormality EXAM: PORTABLE CHEST 1 VIEW COMPARISON:  09/13/2021 FINDINGS: Heart borderline enlarged. Aortic atherosclerosis. No confluent airspace opacities or effusions. No acute bony abnormality. IMPRESSION: No active disease. Electronically Signed   By: Rolm Baptise M.D.   On: 09/21/2021 09:19   [place] EKG SITE RITE  Result Date: 09/15/2021 If Site Rite image not attached, placement could not be confirmed due to current cardiac rhythm.  ECHOCARDIOGRAM COMPLETE  Result Date: 09/14/2021    ECHOCARDIOGRAM REPORT   Patient Name:   KATTALEYA ALIA Date of Exam: 09/14/2021 Medical Rec #:  [id number]       Height:       67.0 in Accession #:    [id number]      Weight:       146.2 lb Date of Birth:  1945/09/03       BSA:          1.770 m Patient Age:    4 years        BP:           129/67 mmHg Patient Gender: F               HR:           84 bpm. Exam Location:  [facility] Procedure: 2D Echo, Cardiac Doppler and Color Doppler Indications:    CHF  History:        Patient has prior history of Echocardiogram examinations, most                 recent 10/05/2019. CHF, Stroke, Arrythmias:Atrial Fibrillation,                 Signs/Symptoms:Altered Mental Status; Risk Factors:Hypertension,                 Diabetes and Former Smoker.  Sonographer:    Wenda Low Referring Phys: [phone] Danett Palazzo  Sonographer Comments: Echo performed with patient supine and on artificial respirator. IMPRESSIONS  1. Severe asymmetric hypertrophy of the basal septum up to 74m. There is evidence of SAM of the MV leaflets with obstruction up to 37 mmHG (variation noted due to afib).  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 Would consider a cardiac MRI for better characterization. This was not well characterized on the last echocardiogram. Left ventricular ejection fraction, by estimation, is 65 to 70%. The left ventricle has normal function. The left ventricle has no regional wall motion abnormalities. There is severe asymmetric left ventricular hypertrophy of the basal-septal segment. Left ventricular diastolic function could  not be evaluated.  2. Right ventricular systolic function is normal. The right ventricular size is normal.  3. Left atrial size was mildly dilated.  4. Right atrial size was mildly dilated.  5. The mitral valve is degenerative. Trivial mitral valve regurgitation. No evidence of mitral stenosis.  6. The aortic valve is tricuspid. There is mild calcification of the aortic valve. There is mild thickening of the aortic valve. Aortic valve regurgitation is not visualized. Aortic valve sclerosis/calcification is present, without any evidence of aortic stenosis. FINDINGS  Left Ventricle: Severe asymmetric hypertrophy of the basal septum up to 8m. There is evidence of SAM of the MV leaflets with obstruction up to 37 mmHG (variation noted due to afib). 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  Would consider a cardiac MRI for better characterization. This was not well characterized on the last echocardiogram. Left ventricular ejection fraction, by estimation, is 65 to 70%. The left ventricle has normal function. The left ventricle has no regional wall motion abnormalities. The left ventricular internal cavity size was normal in size. There is severe asymmetric left ventricular hypertrophy of the basal-septal segment. Left ventricular diastolic function could not be evaluated due to atrial fibrillation. Left ventricular diastolic function could not be evaluated. Right Ventricle: The right ventricular size is normal. No  increase in right ventricular wall thickness. Right ventricular systolic function is normal. Left Atrium: Left atrial size was mildly dilated. Right Atrium: Right atrial size was mildly dilated. Pericardium: Trivial pericardial effusion is present. Presence of epicardial fat layer. Mitral Valve: The mitral valve is degenerative in appearance. Trivial mitral valve regurgitation. No evidence of mitral valve stenosis. MV peak gradient, 7.3 mmHg. The mean mitral valve gradient is 2.0 mmHg. Tricuspid Valve: The tricuspid valve is grossly normal. Tricuspid valve regurgitation is mild . No evidence of tricuspid stenosis. Aortic Valve: The aortic valve is tricuspid. There is mild calcification of the aortic valve. There is mild thickening of the aortic valve. Aortic valve regurgitation is not visualized. Aortic valve sclerosis/calcification is present, without any evidence of aortic stenosis. Aortic valve mean gradient measures 7.5 mmHg. Aortic valve peak gradient measures 16.2 mmHg. Aortic valve area, by VTI measures 2.37 cm. Pulmonic Valve: The pulmonic valve was grossly normal. Pulmonic valve regurgitation is not visualized. No evidence of pulmonic stenosis. Aorta: The aortic root and ascending aorta are structurally normal, with no evidence of dilitation. Venous: IVC assessment for right atrial pressure unable to be performed due to mechanical ventilation. IAS/Shunts: The atrial septum is grossly normal.  LEFT VENTRICLE PLAX 2D LVIDd:         3.80 cm LVIDs:         1.80 cm LV PW:         1.30 cm LV IVS:        1.70 cm LVOT diam:     2.00 cm LV SV:         84 LV SV Index:   48 LVOT Area:     3.14 cm  RIGHT VENTRICLE RV Basal diam:  3.70 cm RV Mid diam:    3.10 cm RV S prime:     12.80 cm/s LEFT ATRIUM             Index        RIGHT ATRIUM           Index LA diam:        4.50 cm 2.54 cm/m   RA Area:     18.10 cm LA  Vol (A2C):   62.5 ml 35.31 ml/m  RA Volume:   53.70 ml  30.34 ml/m LA Vol (A4C):   62.0 ml 35.03 ml/m  LA Biplane Vol: 62.6 ml 35.37 ml/m  AORTIC VALVE                     PULMONIC VALVE AV Area (Vmax):    2.31 cm      PV Vmax:       0.87 m/s AV Area (Vmean):   2.40 cm      PV Peak grad:  3.0 mmHg AV Area (VTI):     2.37 cm AV Vmax:           201.50 cm/s AV Vmean:          123.200 cm/s AV VTI:            0.355 m AV Peak Grad:      16.2 mmHg AV Mean Grad:      7.5 mmHg LVOT Vmax:         148.00 cm/s LVOT Vmean:        94.200 cm/s LVOT VTI:          0.268 m LVOT/AV VTI ratio: 0.75  AORTA Ao Root diam: 3.70 cm Ao Asc diam:  3.40 cm MITRAL VALVE                TRICUSPID VALVE MV Area (PHT): 3.95 cm     TR Peak grad:   35.0 mmHg MV Area VTI:   3.43 cm     TR Vmax:        296.00 cm/s MV Peak grad:  7.3 mmHg MV Mean grad:  2.0 mmHg     SHUNTS MV Vmax:       1.35 m/s     Systemic VTI:  0.27 m MV Vmean:      64.5 cm/s    Systemic Diam: 2.00 cm MV Decel Time: 192 msec MV E velocity: 118.00 cm/s Eleonore Chiquito MD Electronically signed by Eleonore Chiquito MD Signature Date/Time: 09/14/2021/3:29:21 PM    Final    CT Head Wo Contrast  Result Date: 09/13/2021 CLINICAL DATA:  Headache, new or worsening. EXAM: CT HEAD WITHOUT CONTRAST TECHNIQUE: Contiguous axial images were obtained from the base of the skull through the vertex without intravenous contrast. RADIATION DOSE REDUCTION: This exam was performed according to the departmental dose-optimization program which includes automated exposure control, adjustment of the mA and/or kV according to patient size and/or use of iterative reconstruction technique. COMPARISON:  CT examination dated August 04, 2021 FINDINGS: Brain: No evidence of acute infarction, hemorrhage, hydrocephalus, extra-axial collection or mass lesion/mass effect. Low-attenuation of the periventricular and subcortical white matter presumed advanced chronic microvascular ischemic changes. Vascular: No hyperdense vessel or unexpected calcification. Skull: Normal. Negative for fracture or focal lesion.  Sinuses/Orbits: Paranasal sinus postsurgical changes. Other: None. IMPRESSION: 1. No acute intracranial abnormality. 2. Advanced chronic microvascular ischemic changes of the supratentorial white matter. Electronically Signed   By: Keane Police D.O.   On: 09/13/2021 23:38   DG Chest Port 1 View  Result Date: 09/13/2021 CLINICAL DATA:  Hypoxia EXAM: PORTABLE CHEST 1 VIEW COMPARISON:  08/29/2021 FINDINGS: Cardiomegaly. Interstitial prominence throughout the lungs, right greater than left. This could reflect interstitial edema or atypical infection. NG tube is in the stomach. Endotracheal tube is 3 cm above the carina. No effusions or acute bony abnormality. IMPRESSION: Cardiomegaly. Increasing interstitial prominence, right greater than left, favor interstitial  edema although atypical infection cannot be excluded. Electronically Signed   By: Rolm Baptise M.D.   On: 09/13/2021 22:11    Orson Eva, DO  [facility]  If 7PM-7AM, please contact night-coverage [email or web address] Password [email or web address] 10/12/2021, 6:09 PM   LOS: 2 days

## 2021-10-12 NOTE — Plan of Care (Signed)
  Problem: Acute Rehab PT Goals(only PT should resolve) Goal: Pt Will Go Supine/Side To Sit Outcome: Progressing Flowsheets (Taken 10/12/2021 1507) Pt will go Supine/Side to Sit:  with min guard assist  with minimal assist Goal: Patient Will Transfer Sit To/From Stand Outcome: Progressing Flowsheets (Taken 10/12/2021 1507) Patient will transfer sit to/from stand: with minimal assist Goal: Pt Will Transfer Bed To Chair/Chair To Bed Outcome: Progressing Flowsheets (Taken 10/12/2021 1507) Pt will Transfer Bed to Chair/Chair to Bed: with min assist Goal: Pt Will Ambulate Outcome: Progressing Flowsheets (Taken 10/12/2021 1507) Pt will Ambulate:  15 feet  with minimal assist  with moderate assist  with rolling walker   3:08 PM, 10/12/21 Lonell Grandchild, MPT Physical Therapist with [facility] [phone] office [phone] mobile phone

## 2021-10-13 DIAGNOSIS — E872 Acidosis, unspecified: Secondary | ICD-10-CM

## 2021-10-13 DIAGNOSIS — K8020 Calculus of gallbladder without cholecystitis without obstruction: Secondary | ICD-10-CM | POA: Diagnosis not present

## 2021-10-13 DIAGNOSIS — Z66 Do not resuscitate: Secondary | ICD-10-CM | POA: Diagnosis not present

## 2021-10-13 DIAGNOSIS — R627 Adult failure to thrive: Secondary | ICD-10-CM | POA: Diagnosis not present

## 2021-10-13 LAB — COMPREHENSIVE METABOLIC PANEL
ALT: 263 U/L — ABNORMAL HIGH (ref 0–44)
AST: 140 U/L — ABNORMAL HIGH (ref 15–41)
Albumin: 2.6 g/dL — ABNORMAL LOW (ref 3.5–5.0)
Alkaline Phosphatase: 306 U/L — ABNORMAL HIGH (ref 38–126)
Anion gap: 4 — ABNORMAL LOW (ref 5–15)
BUN: 7 mg/dL — ABNORMAL LOW (ref 8–23)
CO2: 27 mmol/L (ref 22–32)
Calcium: 8 mg/dL — ABNORMAL LOW (ref 8.9–10.3)
Chloride: 105 mmol/L (ref 98–111)
Creatinine, Ser: 0.58 mg/dL (ref 0.44–1.00)
GFR, Estimated: >60 mL/min (ref >60)
Glucose, Bld: 184 mg/dL — ABNORMAL HIGH (ref 70–99)
Potassium: 3.6 mmol/L (ref 3.5–5.1)
Sodium: 136 mmol/L (ref 135–145)
Total Bilirubin: 0.4 mg/dL (ref 0.3–1.2)
Total Protein: 5.6 g/dL — ABNORMAL LOW (ref 6.5–8.1)

## 2021-10-13 LAB — CBC WITH DIFFERENTIAL/PLATELET
Abs Immature Granulocytes: 0.01 10*3/uL (ref 0.00–0.07)
Basophils Absolute: 0 10*3/uL (ref 0.0–0.1)
Basophils Relative: 1 %
Eosinophils Absolute: 0.3 10*3/uL (ref 0.0–0.5)
Eosinophils Relative: 6 %
HCT: 31.3 % — ABNORMAL LOW (ref 36.0–46.0)
Hemoglobin: 10.1 g/dL — ABNORMAL LOW (ref 12.0–15.0)
Immature Granulocytes: 0 %
Lymphocytes Relative: 23 %
Lymphs Abs: 1.2 10*3/uL (ref 0.7–4.0)
MCH: 27.6 pg (ref 26.0–34.0)
MCHC: 32.3 g/dL (ref 30.0–36.0)
MCV: 85.5 fL (ref 80.0–100.0)
Monocytes Absolute: 0.4 10*3/uL (ref 0.1–1.0)
Monocytes Relative: 7 %
Neutro Abs: 3.3 10*3/uL (ref 1.7–7.7)
Neutrophils Relative %: 63 %
Platelets: 223 10*3/uL (ref 150–400)
RBC: 3.66 MIL/uL — ABNORMAL LOW (ref 3.87–5.11)
RDW: 15.3 % (ref 11.5–15.5)
WBC: 5.1 10*3/uL (ref 4.0–10.5)
nRBC: 0 % (ref 0.0–0.2)

## 2021-10-13 LAB — GLUCOSE, CAPILLARY
Glucose-Capillary: 194 mg/dL — ABNORMAL HIGH (ref 70–99)
Glucose-Capillary: 149 mg/dL — ABNORMAL HIGH (ref 70–99)
Glucose-Capillary: 139 mg/dL — ABNORMAL HIGH (ref 70–99)
Glucose-Capillary: 201 mg/dL — ABNORMAL HIGH (ref 70–99)

## 2021-10-13 MED ORDER — CLONAZEPAM 0.5 MG PO TABS: 0.5000 mg | ORAL_TABLET | Freq: Three times a day (TID) | ORAL | 0 refills | Status: DC | PRN

## 2021-10-13 MED ORDER — HYDROCODONE-ACETAMINOPHEN 10-325 MG PO TABS: 1.0000 | ORAL_TABLET | Freq: Three times a day (TID) | ORAL | 0 refills | Status: DC | PRN

## 2021-10-13 NOTE — TOC Transition Note (Signed)
Transition of Care [facility]) - CM/SW Discharge Note   Patient Details  Name: [name] MRN: [id number] Date of Birth: December 17, 1945  [facility] CM/SW Contact:  Shade Flood, LCSW Phone Number: 10/13/2021, 1:43 PM   Clinical Narrative:     Pt stable for dc back to SNF per MD. Updated pt's legal guardian who consents for transfer. Copy of DNR faxed to guardian at her request. [facility] transport arranged.  DC clinical sent electronically. RN to call report.  No other TOC needs for dc.  Final next level of care: Skilled Nursing Facility Barriers to Discharge: Barriers Resolved   Patient Goals and CMS Choice Patient states their goals for this hospitalization and ongoing recovery are:: anticipate return to SNF      Discharge Placement                       Discharge Plan and Services In-house Referral: Clinical Social Work                                   Social Determinants of Health (SDOH) Interventions     Readmission Risk Interventions    10/11/2021   11:15 AM 09/16/2021    2:02 PM 03/29/2021   10:51 AM  Readmission Risk Prevention Plan  Transportation Screening Complete Complete Complete  Medication Review Press photographer) Complete Complete   HRI or Home Care Consult Complete Complete Complete  SW Recovery Care/Counseling Consult Complete  Complete  Palliative Care Screening Not Applicable Not Applicable Not Applicable  Skilled Nursing Facility Complete Not Applicable Complete

## 2021-10-13 NOTE — Care Management Important Message (Signed)
Important Message  Patient Details  Name: [name] MRN: [id number] Date of Birth: 1945/08/06   Medicare Important Message Given:  Yes     [name] 10/13/2021, 11:57 AM

## 2021-10-13 NOTE — Progress Notes (Signed)
Patient is stable and ready for discharge to [facility]. IV removed. Writer called reported spoke with [place], LPN and she verbalized understanding and stated she knew the patient well. NT and Probation officer dressed patient and transferred her to her personal WC for [facility] transportation to transfer patient.

## 2021-10-13 NOTE — Discharge Summary (Signed)
Physician Discharge Summary   Patient: [name] MRN: [id number] DOB: 17-Nov-1945  Admit date:     10/10/2021  Discharge date: 10/13/21  Discharge Physician: Shanon Brow Elani Delph   PCP: Moss Mc, NP   Recommendations at discharge:   Please follow up with primary care provider within 1-2 weeks  Please repeat BMP and CBC in one week    Hospital Course: 76 year old female well-known to the medical service given frequent recent hospitalizations and ongoing failure to thrive was recently discharged from the hospital 09/19/2021.  Seen in the ED 2 days later 09/21/2021.  She had discharged to SNF.  She has a history of schizophrenia, hypertension, type 2 diabetes mellitus, chronic anxiety with benzodiazepine dependence, chronic pain and opioid dependence, paroxysmal atrial fibrillation, DVT, multiple hospitalizations for sepsis and shock, chronic hypotension on midodrine, GERD and diastolic heart failure.  Recently treated for acute tracheobronchitis and also noted to have hypertrophic obstructive cardiomyopathy.  She has a history of stricture and stenosis of the esophagus as well.  She presented to the [place] office today for scheduled outpatient visit.  She was noted to be severely hypotensive, lethargic and complaining of abdominal pain.  EMS was called to take her to the emergency department.  She was noted to have a presentation of septic shock.  She was hypotensive, appeared clinically dry and complained of abdominal pain. CT abdomen was notable for cholelithiasis. Surgery recommended she have a HIDA scan.  She was resuscitated with fluids and antibiotics.  She is being admitted for further management.    Assessment and Plan:  Hypotension -initially felt to be septic shock -improved with fluid resuscitation -urine and blood cultures neg -CTA chest neg for consolidation, infiltrates -remains afebrile -now felt to be due to volume depletion -d/c antibiotics and monitor -PCT<0.10    Calculus of gallbladder without cholecystitis without obstruction --CTA abd showed distended GB with stones fortunately HIDA scan was normal --appreciate surgery consult, no need for cholecystectomy   Schizophrenia ([place]) Continue  Risperdal, Remeron, Cymbalta--restarted Continue clonazepam--restarted   Uncontrolled type 2 diabetes mellitus with hyperglycemia, with long-term current use of insulin (HCC) Continue Levemir 10 units hx Continue NovoLog sliding scale 03/22/2021 hemoglobin A1c 7.1 08/19/21 A1C--6.2 CBGs largely controlled during the hospitalization   GERD (gastroesophageal reflux disease) - Continue Protonix   Chronic diastolic CHF (congestive heart failure) (HCC) -appears clinically euvolemic -hold lasix -08/2021 echo-Repeat Echo EF 65-70%, +HOCM, no WMA   Lactic Acidosis Sepsis ruled out Likely delayed clearance and volume depletion Improved with IVF   HOCM (hypertrophic obstructive cardiomyopathy) ([place]) Avoid hypotension Use BB for afib neosynephrine if hypotensive outpt cardiology follow up Seen by cardiology during last in Aug 2023 hospitalization   DVT, lower extremity, recurrent ([facility]) transitioned back to apixaban    Stricture and stenosis of esophagus 07/23/2021 EGD--showed food in the middle third and lower third of the esophagus.  The patient was subsequently debated during the procedure secondary to aspiration risk.  90% of the retained food was removed with 10% pushed into the stomach.  A distal esophageal stricture was noted. -Continue full liquid diet once extubated -Continues to have dysphagia -07/25/21 barium swallow>>Nonspecific esophageal dysmotility disorder, with termination of primary peristaltic waves in the mid esophagus, and tertiary contractions in the esophagus Continue IV pantoprazole twice daily Tolerating dys 1 diet   Opioid dependence ([place]) PDMP reviewed Norco 10/325, #90, last refilled on 09/01/21, 08/03/21 Clonazepam 0.5 mg, #90,  last filled on 09/08/21, 08/26/21   Essential hypertension Holding dilitazem and  metoprolol succinate due to hypotension initially --restart metoprolol succinate now that BPs improving   Goals of care, counseling/discussion I Discussed with patient's Legal Launa Grill (Aug 2023 admit) --agrees with DNR   Pressure injury of skin Stage 1 on buttock and left hip Present on admission Local care   Acute metabolic encephalopathy - obtunded in ED initially - Patient was hypotensive which is likely contributing but also hypoxic  -  altered mental status secondary to hypoxia and hypotension - overall improved, back to baseline   Paroxysmal atrial fibrillation (HCC) Continue apixaban Continue amiodarone Continue metoprolol       Pain control - [place] Controlled Substance Reporting System database was reviewed. and patient was instructed, not to drive, operate heavy machinery, perform activities at heights, swimming or participation in water activities or provide baby-sitting services while on Pain, Sleep and Anxiety Medications; until their outpatient Physician has advised to do so again. Also recommended to not to take more than prescribed Pain, Sleep and Anxiety Medications.  Consultants: none Procedures performed: none  Disposition: Skilled nursing facility Diet recommendation:  Dysphagia type 2 Thin  Liquid DISCHARGE MEDICATION: Allergies as of 10/13/2021       Reactions   Sulfa Antibiotics Rash        Medication List     STOP taking these medications    EasyMax Test test strip Generic drug: glucose blood   GoodSense Artificial Tears 5-6 MG/ML Soln Generic drug: Polyvinyl Alcohol-Povidone   Lancets Thin Misc   loratadine 10 MG tablet Commonly known as: CLARITIN   metoCLOPramide 5 MG tablet Commonly known as: REGLAN   metoprolol succinate 25 MG 24 hr tablet Commonly known as: TOPROL-XL   omeprazole 40 MG capsule Commonly known as:  PRILOSEC   Santyl 250 UNIT/GM ointment Generic drug: collagenase       TAKE these medications    Abilify 2 MG tablet Generic drug: ARIPiprazole Take 1 tablet (2 mg total) by mouth daily.   acetaminophen 325 MG tablet Commonly known as: TYLENOL Take 650 mg by mouth 3 (three) times daily as needed for mild pain or moderate pain.   albuterol 108 (90 Base) MCG/ACT inhaler Commonly known as: VENTOLIN HFA Inhale 2 puffs into the lungs every 6 (six) hours as needed for wheezing or shortness of breath.   amiodarone 200 MG tablet Commonly known as: PACERONE Take 1 tablet (200 mg total) by mouth daily.   apixaban 5 MG Tabs tablet Commonly known as: ELIQUIS Take 1 tablet (5 mg total) by mouth 2 (two) times daily.   atorvastatin 40 MG tablet Commonly known as: LIPITOR Take 1 tablet by mouth at bedtime.   BAZA PROTECT EX Apply 1 Application topically in the morning and at bedtime.   busPIRone 7.5 MG tablet Commonly known as: BUSPAR Take 7.5 mg by mouth 2 (two) times daily.   calcium carbonate 500 MG chewable tablet Commonly known as: TUMS - dosed in mg elemental calcium Chew 1 tablet by mouth 4 (four) times daily as needed for heartburn.   cholecalciferol 25 MCG (1000 UNIT) tablet Commonly known as: VITAMIN D3 Take 2,000 Units by mouth daily.   clonazePAM 0.5 MG tablet Commonly known as: KLONOPIN Take 1 tablet (0.5 mg total) by mouth 3 (three) times daily as needed for anxiety. What changed:  when to take this reasons to take this Another medication with the same name was removed. Continue taking this medication, and follow the directions you see here.   DULoxetine 60 MG  capsule Commonly known as: CYMBALTA Take 1 capsule by mouth daily.   HYDROcodone-acetaminophen 10-325 MG tablet Commonly known as: NORCO Take 1 tablet by mouth every 8 (eight) hours as needed for severe pain. What changed:  when to take this reasons to take this   latanoprost 0.005 % ophthalmic  solution Commonly known as: XALATAN Place 1 drop into both eyes at bedtime.   Levemir FlexTouch 100 UNIT/ML FlexPen Generic drug: insulin detemir INJECT 22 UNITS SUBCUTANEOUSLY AT BEDTIME.(HOLD IF BS<70: CALL MD IF BS> 400) What changed: See the new instructions.   lubiprostone 8 MCG capsule Commonly known as: AMITIZA Take 1 capsule by mouth daily.   metoprolol tartrate 25 MG tablet Commonly known as: LOPRESSOR Take 12.5 mg by mouth 2 (two) times daily.   midodrine 10 MG tablet Commonly known as: PROAMATINE Take 10 mg by mouth 3 (three) times daily.   mirtazapine 30 MG tablet Commonly known as: REMERON Take 30 mg by mouth at bedtime.   multivitamin tablet Take 1 tablet by mouth daily.   naloxegol oxalate 12.5 MG Tabs tablet Commonly known as: MOVANTIK Take 1 tablet (12.5 mg total) by mouth daily.   NovoLOG FlexPen 100 UNIT/ML FlexPen Generic drug: insulin aspart INJECT SUBCUTANEOUSLY AS FOLLOWS WITH MEALS: 90-150=10u: 151-200=11u: 201-250=12u: 251-300=13u: 301-350=14u: 351-400=15u: BS>400=16u & CALL MD. What changed: See the new instructions.   ondansetron 4 MG tablet Commonly known as: ZOFRAN Take 4 mg by mouth 2 (two) times daily.   oxybutynin 5 MG 24 hr tablet Commonly known as: DITROPAN-XL Take 5 mg by mouth daily.   pantoprazole 40 MG tablet Commonly known as: PROTONIX Take 1 tablet (40 mg total) by mouth 2 (two) times daily before a meal.   polyethylene glycol 17 g packet Commonly known as: MIRALAX / GLYCOLAX Take 17 g by mouth daily.   risperiDONE 1 MG tablet Commonly known as: RISPERDAL Take 1 mg by mouth 2 (two) times daily. Take one tablet at 900am and 1 tablet at 5pm and '2mg'$  at bedtime   risperiDONE 2 MG tablet Commonly known as: RISPERDAL Take 2 mg by mouth at bedtime. Take '2mg'$  at bedtime and '1mg'$  at 9am and 5p daily   senna 8.6 MG tablet Commonly known as: SENOKOT Take 1 tablet by mouth daily.   Tradjenta 5 MG Tabs tablet Generic drug:  linagliptin TAKE (1) TABLET BY MOUTH ONCE DAILY. What changed: See the new instructions.        Discharge Exam: Filed Weights   10/10/21 1025 10/10/21 2046 10/10/21 2052  Weight: 57.2 kg 58.9 kg 58.9 kg   HEENT:  [place]/AT, No thrush, no icterus CV:  IRRR, no rub, no S3, no S4 Lung:  bibasilar rales. No wheeze Abd:  soft/+BS, NT Ext:  No edema, no lymphangitis, no synovitis, no rash   Condition at discharge: stable  The results of significant diagnostics from this hospitalization (including imaging, microbiology, ancillary and laboratory) are listed below for reference.   Imaging Studies: NM Hepatobiliary Liver Func  Result Date: 10/11/2021 CLINICAL DATA:  Nausea and abdominal pain. EXAM: NUCLEAR MEDICINE HEPATOBILIARY IMAGING TECHNIQUE: Sequential images of the abdomen were obtained out to 60 minutes following intravenous administration of radiopharmaceutical. RADIOPHARMACEUTICALS:  5.5 mCi Tc-29m Choletec IV COMPARISON:  CT abdomen pelvis from yesterday. FINDINGS: Prompt uptake and biliary excretion of activity by the liver is seen. Gallbladder activity is visualized, consistent with patency of cystic duct. Biliary activity passes into small bowel, consistent with patent common bile duct. IMPRESSION: Normal hepatobiliary scan.  No evidence of cholecystitis. Electronically Signed   By: Titus Dubin M.D.   On: 10/11/2021 08:26   CT Angio Chest/Abd/Pel for Dissection W and/or Wo Contrast  Result Date: 10/10/2021 CLINICAL DATA:  Increased weakness, hypotension and bradycardia. EXAM: CT ANGIOGRAPHY CHEST, ABDOMEN AND PELVIS TECHNIQUE: Non-contrast CT of the chest was initially obtained. Multidetector CT imaging through the chest, abdomen and pelvis was performed using the standard protocol during bolus administration of intravenous contrast. Multiplanar reconstructed images and MIPs were obtained and reviewed to evaluate the vascular anatomy. RADIATION DOSE REDUCTION: This exam was  performed according to the departmental dose-optimization program which includes automated exposure control, adjustment of the mA and/or kV according to patient size and/or use of iterative reconstruction technique. CONTRAST:  8m OMNIPAQUE IOHEXOL 350 MG/ML SOLN COMPARISON:  CT abdomen pelvis, 07/14/2021. Current chest radiograph. Chest CTA, 05/18/2021. FINDINGS: CTA CHEST FINDINGS Cardiovascular: Thoracic aorta is normal in caliber. No dissection. Mild atherosclerosis. No significant stenosis. Arch branch vessels are widely patent. Heart mildly enlarged. Three-vessel coronary artery calcifications. No pericardial effusion. Main pulmonary artery is mildly dilated to 3.5 cm. Mediastinum/Nodes: Small thyroid nodules up to 1.3 cm. No follow-up recommended. No neck base, mediastinal or hilar masses. Several subcentimeter but prominent mediastinal lymph nodes. Mildly enlarged subcarinal lymph node, 1.5 cm in short axis. No hilar adenopathy. Normal trachea. Esophagus mildly distended. Small to moderate-sized hiatal hernia. Lungs/Pleura: Mild peripheral interstitial thickening and areas of mild reticular scarring. Minor dependent subsegmental atelectasis. Small calcified granuloma in the right lower lobe. No evidence of pneumonia or pulmonary edema. No mass or suspicious nodule. No pleural effusion or pneumothorax. Musculoskeletal: No fracture or acute finding. No bone lesion. Skeletal structures are demineralized. No chest wall mass. Review of the MIP images confirms the above findings. CTA ABDOMEN AND PELVIS FINDINGS VASCULAR Aorta: Aorta normal in caliber. Diffuse atherosclerosis with no significant stenosis. No dissection. Celiac: Patent without evidence of aneurysm, dissection, vasculitis or significant stenosis. SMA: Patent without evidence of aneurysm, dissection, vasculitis or significant stenosis. Renals: Both renal arteries are patent without evidence of aneurysm, dissection, vasculitis, fibromuscular dysplasia  or significant stenosis. IMA: Patent without evidence of aneurysm, dissection, vasculitis or significant stenosis. Inflow: Patent without evidence of aneurysm, dissection, vasculitis or significant stenosis. Veins: No obvious venous abnormality within the limitations of this arterial phase study. Review of the MIP images confirms the above findings. NON-VASCULAR Hepatobiliary: Liver normal in size. 3.4 cm right lobe hemangioma, stable. No other liver masses or lesions. Gallbladder is distended. There are scattered wall calcifications. Small dependent gallstone with another stone projecting in the cystic duct. No pericholecystic inflammation. Common bile duct measures up to 9 mm. No common bile duct stone. Pancreas: Unremarkable. No pancreatic ductal dilatation or surrounding inflammatory changes. Spleen: Normal in size without focal abnormality. Adrenals/Urinary Tract: No adrenal nodule. Kidneys normal in size, orientation and position. Mild dilation of the intrarenal collecting systems and proximal to mid ureters, relatively symmetric. No ureteral stones. Bladder moderately distended, otherwise unremarkable. Stomach/Bowel: Rectum significantly distended with stool, measuring 8.5 cm in diameter. Colon is also distended with stool, which is moderate to markedly increased. Small bowel is normal in caliber. There is no bowel wall thickening or inflammatory changes. Small to moderate hiatal hernia. Stomach otherwise unremarkable. Lymphatic: Prominent gastrohepatic ligament lymph nodes, up to 1 cm in short axis. These are stable. No other enlarged lymph nodes. Reproductive: Uterus and bilateral adnexa are unremarkable. Other: No abdominal wall hernia or abnormality. No abdominopelvic ascites. Musculoskeletal: Next lucent and sclerotic  area in the S1 vertebra that is stable consistent with a benign lesion. No fracture or acute finding. No suspicious bone lesion. Review of the MIP images confirms the above findings.  IMPRESSION: 1. Normal caliber thoracoabdominal aorta. No dissection. No evidence of acute aortic syndrome. 2. Aortic atherosclerosis no significant stenosis. Aortic branch vessels are all widely patent. 3. Distended gallbladder with scattered wall calcification, small dependent stone and small stone in the cystic duct, but no adjacent inflammation. Findings are similar to the prior CT. Consider early acute cholecystitis if there are consistent clinical findings. 4. Small to moderate hiatal hernia. 5. Rectum significantly distended with stool. Moderate to marked increase in colonic stool. No bowel obstruction or inflammation. Electronically Signed   By: Lajean Manes M.D.   On: 10/10/2021 14:07   CT Head Wo Contrast  Result Date: 10/10/2021 CLINICAL DATA:  Altered mental status, hypotension, bradycardia EXAM: CT HEAD WITHOUT CONTRAST TECHNIQUE: Contiguous axial images were obtained from the base of the skull through the vertex without intravenous contrast. RADIATION DOSE REDUCTION: This exam was performed according to the departmental dose-optimization program which includes automated exposure control, adjustment of the mA and/or kV according to patient size and/or use of iterative reconstruction technique. COMPARISON:  09/21/2021 FINDINGS: Brain: No acute intracranial findings are seen. There are no signs of bleeding within the cranium. Cortical sulci are prominent. There is decreased density in periventricular white matter. Vascular: Scattered arterial calcifications are seen. Skull: There is mild hyperostosis frontalis interna. Sinuses/Orbits: Defects in the medial walls of maxillary sinuses may be postsurgical. There are no air-fluid levels in the visualized paranasal sinuses. Other: None. IMPRESSION: No acute intracranial findings are seen in noncontrast CT brain. Atrophy. Small vessel disease. Electronically Signed   By: Elmer Picker M.D.   On: 10/10/2021 13:54   DG Chest Port 1 View  Result  Date: 10/10/2021 CLINICAL DATA:  Possible sepsis EXAM: PORTABLE CHEST 1 VIEW COMPARISON:  Previous studies including the examination of 09/21/2021 FINDINGS: Transverse diameter of heart is slightly increased. There are no signs of pulmonary edema or focal pulmonary consolidation. Left lateral CP angle is indistinct with no interval change. There is no pneumothorax. Degenerative changes are noted in both shoulders. IMPRESSION: There are no new infiltrates or signs of pulmonary edema. Electronically Signed   By: Elmer Picker M.D.   On: 10/10/2021 10:52   MR BRAIN WO CONTRAST  Result Date: 09/21/2021 CLINICAL DATA:  Mental status change, unknown cause EXAM: MRI HEAD WITHOUT CONTRAST TECHNIQUE: Multiplanar, multiecho pulse sequences of the brain and surrounding structures were obtained without intravenous contrast. COMPARISON:  2018 FINDINGS: Brain: There is no acute infarction or intracranial hemorrhage. There is no intracranial mass, mass effect, or edema. There is no hydrocephalus or extra-axial fluid collection. Prominence of the ventricles and sulci reflects similar parenchymal volume loss. Foci of susceptibility the left corona radiata and thalamus likely reflect chronic microhemorrhages. Patchy and confluent areas of T2 hyperintensity in the supratentorial white matter are nonspecific probably reflect similar moderate chronic microvascular ischemic changes there are chronic small vessel infarcts the central white matter bilaterally. Vascular: Major vessel flow voids at the skull base are preserved. Skull and upper cervical spine: Normal marrow signal is preserved. Sinuses/Orbits: Evidence of prior sinonasal surgery. Orbits are unremarkable. Other: Sella is unremarkable.  Trace mastoid fluid opacification. IMPRESSION: No acute infarction, hemorrhage, or mass. Similar chronic microvascular ischemic changes, small infarcts, and parenchymal volume loss. Electronically Signed   By: Macy Mis M.D.   On:  09/21/2021 13:31   CT Head Wo Contrast  Result Date: 09/21/2021 CLINICAL DATA:  Altered mental status, weakness. EXAM: CT HEAD WITHOUT CONTRAST TECHNIQUE: Contiguous axial images were obtained from the base of the skull through the vertex without intravenous contrast. RADIATION DOSE REDUCTION: This exam was performed according to the departmental dose-optimization program which includes automated exposure control, adjustment of the mA and/or kV according to patient size and/or use of iterative reconstruction technique. COMPARISON:  September 13, 2021. FINDINGS: Brain: Mild chronic ischemic white matter disease is noted. No mass effect or midline shift is noted. Ventricular size is within normal limits. There is no evidence of mass lesion, hemorrhage or acute infarction. Vascular: No hyperdense vessel or unexpected calcification. Skull: Normal. Negative for fracture or focal lesion. Sinuses/Orbits: No acute finding. Other: None. IMPRESSION: No acute intracranial abnormality seen. Electronically Signed   By: Marijo Conception M.D.   On: 09/21/2021 10:42   DG Chest Port 1 View  Result Date: 09/21/2021 CLINICAL DATA:  Questionable sepsis - evaluate for abnormality EXAM: PORTABLE CHEST 1 VIEW COMPARISON:  09/13/2021 FINDINGS: Heart borderline enlarged. Aortic atherosclerosis. No confluent airspace opacities or effusions. No acute bony abnormality. IMPRESSION: No active disease. Electronically Signed   By: Rolm Baptise M.D.   On: 09/21/2021 09:19   [place] EKG SITE RITE  Result Date: 09/15/2021 If Site Rite image not attached, placement could not be confirmed due to current cardiac rhythm.  ECHOCARDIOGRAM COMPLETE  Result Date: 09/14/2021    ECHOCARDIOGRAM REPORT   Patient Name:   CHRYSTIAN RESSLER Date of Exam: 09/14/2021 Medical Rec #:  [id number]       Height:       67.0 in Accession #:    [id number]      Weight:       146.2 lb Date of Birth:  1945/06/27       BSA:          1.770 m Patient Age:    64 years        BP:            129/67 mmHg Patient Gender: F               HR:           84 bpm. Exam Location:  [facility] Procedure: 2D Echo, Cardiac Doppler and Color Doppler Indications:    CHF  History:        Patient has prior history of Echocardiogram examinations, most                 recent 10/05/2019. CHF, Stroke, Arrythmias:Atrial Fibrillation,                 Signs/Symptoms:Altered Mental Status; Risk Factors:Hypertension,                 Diabetes and Former Smoker.  Sonographer:    Wenda Low Referring Phys: [phone] Tannisha Kennington  Sonographer Comments: Echo performed with patient supine and on artificial respirator. IMPRESSIONS  1. Severe asymmetric hypertrophy of the basal septum up to 15m. There is evidence of SAM of the MV leaflets with obstruction up to 37 mmHG (variation noted due to afib). 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 Would consider a cardiac MRI for better characterization. This was not well characterized on the last echocardiogram. Left ventricular ejection fraction, by estimation, is 65 to 70%. The left ventricle has normal function. The left ventricle has no regional wall motion abnormalities. There is severe asymmetric  left ventricular hypertrophy of the basal-septal segment. Left ventricular diastolic function could not be evaluated.  2. Right ventricular systolic function is normal. The right ventricular size is normal.  3. Left atrial size was mildly dilated.  4. Right atrial size was mildly dilated.  5. The mitral valve is degenerative. Trivial mitral valve regurgitation. No evidence of mitral stenosis.  6. The aortic valve is tricuspid. There is mild calcification of the aortic valve. There is mild thickening of the aortic valve. Aortic valve regurgitation is not visualized. Aortic valve sclerosis/calcification is present, without any evidence of aortic stenosis. FINDINGS  Left Ventricle: Severe asymmetric hypertrophy of the basal septum up to 71m. There is evidence  of SAM of the MV leaflets with obstruction up to 37 mmHG (variation noted due to afib). 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  Would consider a cardiac MRI for better characterization. This was not well characterized on the last echocardiogram. Left ventricular ejection fraction, by estimation, is 65 to 70%. The left ventricle has normal function. The left ventricle has no regional wall motion abnormalities. The left ventricular internal cavity size was normal in size. There is severe asymmetric left ventricular hypertrophy of the basal-septal segment. Left ventricular diastolic function could not be evaluated due to atrial fibrillation. Left ventricular diastolic function could not be evaluated. Right Ventricle: The right ventricular size is normal. No increase in right ventricular wall thickness. Right ventricular systolic function is normal. Left Atrium: Left atrial size was mildly dilated. Right Atrium: Right atrial size was mildly dilated. Pericardium: Trivial pericardial effusion is present. Presence of epicardial fat layer. Mitral Valve: The mitral valve is degenerative in appearance. Trivial mitral valve regurgitation. No evidence of mitral valve stenosis. MV peak gradient, 7.3 mmHg. The mean mitral valve gradient is 2.0 mmHg. Tricuspid Valve: The tricuspid valve is grossly normal. Tricuspid valve regurgitation is mild . No evidence of tricuspid stenosis. Aortic Valve: The aortic valve is tricuspid. There is mild calcification of the aortic valve. There is mild thickening of the aortic valve. Aortic valve regurgitation is not visualized. Aortic valve sclerosis/calcification is present, without any evidence of aortic stenosis. Aortic valve mean gradient measures 7.5 mmHg. Aortic valve peak gradient measures 16.2 mmHg. Aortic valve area, by VTI measures 2.37 cm. Pulmonic Valve: The pulmonic valve was grossly normal. Pulmonic valve regurgitation is not visualized. No evidence of  pulmonic stenosis. Aorta: The aortic root and ascending aorta are structurally normal, with no evidence of dilitation. Venous: IVC assessment for right atrial pressure unable to be performed due to mechanical ventilation. IAS/Shunts: The atrial septum is grossly normal.  LEFT VENTRICLE PLAX 2D LVIDd:         3.80 cm LVIDs:         1.80 cm LV PW:         1.30 cm LV IVS:        1.70 cm LVOT diam:     2.00 cm LV SV:         84 LV SV Index:   48 LVOT Area:     3.14 cm  RIGHT VENTRICLE RV Basal diam:  3.70 cm RV Mid diam:    3.10 cm RV S prime:     12.80 cm/s LEFT ATRIUM             Index        RIGHT ATRIUM           Index LA diam:        4.50 cm  2.54 cm/m   RA Area:     18.10 cm LA Vol (A2C):   62.5 ml 35.31 ml/m  RA Volume:   53.70 ml  30.34 ml/m LA Vol (A4C):   62.0 ml 35.03 ml/m LA Biplane Vol: 62.6 ml 35.37 ml/m  AORTIC VALVE                     PULMONIC VALVE AV Area (Vmax):    2.31 cm      PV Vmax:       0.87 m/s AV Area (Vmean):   2.40 cm      PV Peak grad:  3.0 mmHg AV Area (VTI):     2.37 cm AV Vmax:           201.50 cm/s AV Vmean:          123.200 cm/s AV VTI:            0.355 m AV Peak Grad:      16.2 mmHg AV Mean Grad:      7.5 mmHg LVOT Vmax:         148.00 cm/s LVOT Vmean:        94.200 cm/s LVOT VTI:          0.268 m LVOT/AV VTI ratio: 0.75  AORTA Ao Root diam: 3.70 cm Ao Asc diam:  3.40 cm MITRAL VALVE                TRICUSPID VALVE MV Area (PHT): 3.95 cm     TR Peak grad:   35.0 mmHg MV Area VTI:   3.43 cm     TR Vmax:        296.00 cm/s MV Peak grad:  7.3 mmHg MV Mean grad:  2.0 mmHg     SHUNTS MV Vmax:       1.35 m/s     Systemic VTI:  0.27 m MV Vmean:      64.5 cm/s    Systemic Diam: 2.00 cm MV Decel Time: 192 msec MV E velocity: 118.00 cm/s Eleonore Chiquito MD Electronically signed by Eleonore Chiquito MD Signature Date/Time: 09/14/2021/3:29:21 PM    Final    CT Head Wo Contrast  Result Date: 09/13/2021 CLINICAL DATA:  Headache, new or worsening. EXAM: CT HEAD WITHOUT CONTRAST TECHNIQUE:  Contiguous axial images were obtained from the base of the skull through the vertex without intravenous contrast. RADIATION DOSE REDUCTION: This exam was performed according to the departmental dose-optimization program which includes automated exposure control, adjustment of the mA and/or kV according to patient size and/or use of iterative reconstruction technique. COMPARISON:  CT examination dated August 04, 2021 FINDINGS: Brain: No evidence of acute infarction, hemorrhage, hydrocephalus, extra-axial collection or mass lesion/mass effect. Low-attenuation of the periventricular and subcortical white matter presumed advanced chronic microvascular ischemic changes. Vascular: No hyperdense vessel or unexpected calcification. Skull: Normal. Negative for fracture or focal lesion. Sinuses/Orbits: Paranasal sinus postsurgical changes. Other: None. IMPRESSION: 1. No acute intracranial abnormality. 2. Advanced chronic microvascular ischemic changes of the supratentorial white matter. Electronically Signed   By: Keane Police D.O.   On: 09/13/2021 23:38   DG Chest Port 1 View  Result Date: 09/13/2021 CLINICAL DATA:  Hypoxia EXAM: PORTABLE CHEST 1 VIEW COMPARISON:  08/29/2021 FINDINGS: Cardiomegaly. Interstitial prominence throughout the lungs, right greater than left. This could reflect interstitial edema or atypical infection. NG tube is in the stomach. Endotracheal tube is 3 cm above the carina. No effusions or acute bony  abnormality. IMPRESSION: Cardiomegaly. Increasing interstitial prominence, right greater than left, favor interstitial edema although atypical infection cannot be excluded. Electronically Signed   By: Rolm Baptise M.D.   On: 09/13/2021 22:11    Microbiology: Results for orders placed or performed during the hospital encounter of 10/10/21  Resp Panel by RT-PCR (Flu A&B, Covid) Anterior Nasal Swab     Status: None   Collection Time: 10/10/21 10:28 AM   Specimen: Anterior Nasal Swab  Result Value  Ref Range Status   SARS Coronavirus 2 by RT PCR NEGATIVE NEGATIVE Final    Comment: (NOTE) SARS-CoV-2 target nucleic acids are NOT DETECTED.  The SARS-CoV-2 RNA is generally detectable in upper respiratory specimens during the acute phase of infection. The lowest concentration of SARS-CoV-2 viral copies this assay can detect is 138 copies/mL. A negative result does not preclude SARS-Cov-2 infection and should not be used as the sole basis for treatment or other patient management decisions. A negative result may occur with  improper specimen collection/handling, submission of specimen other than nasopharyngeal swab, presence of viral mutation(s) within the areas targeted by this assay, and inadequate number of viral copies(<138 copies/mL). A negative result must be combined with clinical observations, patient history, and epidemiological information. The expected result is Negative.  Fact Sheet for Patients:  EntrepreneurPulse.com.au  Fact Sheet for Healthcare Providers:  IncredibleEmployment.be  This test is no t yet approved or cleared by the [place] FDA and  has been authorized for detection and/or diagnosis of SARS-CoV-2 by FDA under an Emergency Use Authorization (EUA). This EUA will remain  in effect (meaning this test can be used) for the duration of the COVID-19 declaration under Section 564(b)(1) of the Act, 21 U.S.C.section 360bbb-3(b)(1), unless the authorization is terminated  or revoked sooner.       Influenza A by PCR NEGATIVE NEGATIVE Final   Influenza B by PCR NEGATIVE NEGATIVE Final    Comment: (NOTE) The Xpert Xpress SARS-CoV-2/FLU/RSV plus assay is intended as an aid in the diagnosis of influenza from Nasopharyngeal swab specimens and should not be used as a sole basis for treatment. Nasal washings and aspirates are unacceptable for Xpert Xpress SARS-CoV-2/FLU/RSV testing.  Fact Sheet for  Patients: EntrepreneurPulse.com.au  Fact Sheet for Healthcare Providers: IncredibleEmployment.be  This test is not yet approved or cleared by the [place] FDA and has been authorized for detection and/or diagnosis of SARS-CoV-2 by FDA under an Emergency Use Authorization (EUA). This EUA will remain in effect (meaning this test can be used) for the duration of the COVID-19 declaration under Section 564(b)(1) of the Act, 21 U.S.C. section 360bbb-3(b)(1), unless the authorization is terminated or revoked.  Performed at [facility], [address], [place], [place] [address]   Urine Culture     Status: None   Collection Time: 10/10/21 10:28 AM   Specimen: Urine, Catheterized  Result Value Ref Range Status   Specimen Description   Final    URINE, CATHETERIZED Performed at [facility], [address] [place], [place] [address]    Special Requests   Final    NONE Performed at [facility], [address] [place], [place] [address]    Culture   Final    NO GROWTH Performed at [facility], [address], [place],  [address]    Report Status 10/11/2021 FINAL  Final  Blood Culture (routine x 2)     Status: None (Preliminary result)   Collection Time: 10/10/21 10:35 AM   Specimen:  Left Antecubital; Blood  Result Value Ref Range Status   Specimen Description   Final    LEFT ANTECUBITAL BOTTLES DRAWN AEROBIC AND ANAEROBIC   Special Requests Blood Culture adequate volume  Final   Culture   Final    NO GROWTH 3 DAYS Performed at [facility], [address], [place], [place] [address]    Report Status PENDING  Incomplete  Blood Culture (routine x 2)     Status: None (Preliminary result)   Collection Time: 10/10/21 11:25 AM   Specimen: BLOOD RIGHT WRIST  Result Value Ref Range Status   Specimen Description   Final    BLOOD RIGHT WRIST BOTTLES DRAWN AEROBIC AND ANAEROBIC   Special Requests Blood Culture adequate volume  Final    Culture   Final    NO GROWTH 3 DAYS Performed at [facility], [address] [place],  [address]    Report Status PENDING  Incomplete    Labs: CBC: Recent Labs  Lab 10/10/21 1028 10/10/21 1641 10/11/21 0505 10/12/21 0548 10/13/21 0557  WBC 9.4 9.4 6.9 3.7* 5.1  NEUTROABS 7.4  --  4.3 1.6* 3.3  HGB 11.9* 10.8* 9.3* 10.1* 10.1*  HCT 36.8 33.9* 28.8* 31.5* 31.3*  MCV 86.6 86.9 86.0 86.5 85.5  PLT 286 249 197 209 591   Basic Metabolic Panel: Recent Labs  Lab 10/10/21 1028 10/10/21 1641 10/11/21 0505 10/12/21 0548 10/13/21 0557  NA 136  --  138 138 136  K 3.5  --  3.2* 3.8 3.6  CL 101  --  107 106 105  CO2 27  --  '26 27 27  '$ GLUCOSE 210*  --  91 83 184*  BUN 13  --  9 7* 7*  CREATININE 0.97 0.69 0.55 0.56 0.58  CALCIUM 8.6*  --  7.7* 8.1* 8.0*  MG  --   --  1.7  --   --    Liver Function Tests: Recent Labs  Lab 10/10/21 1028 10/11/21 0505 10/12/21 0548 10/13/21 0557  AST 13* 29 480* 140*  ALT 10 13 438* 263*  ALKPHOS 106 98 385* 306*  BILITOT 0.4 0.5 1.2 0.4  PROT 6.7 5.1* 5.6* 5.6*  ALBUMIN 3.4* 2.3* 2.5* 2.6*   CBG: Recent Labs  Lab 10/12/21 1140 10/12/21 1616 10/12/21 2003 10/13/21 0307 10/13/21 0726  GLUCAP 220* 83 126* 194* 149*    Discharge time spent: greater than 30 minutes.  Signed: Orson Eva, MD [facility] 10/13/2021

## 2021-10-15 LAB — CULTURE, BLOOD (ROUTINE X 2)
Culture: NO GROWTH
Culture: NO GROWTH
Special Requests: ADEQUATE
Special Requests: ADEQUATE

## 2021-11-09 ENCOUNTER — Inpatient Hospital Stay (HOSPITAL_COMMUNITY)
Admission: EM | Admit: 2021-11-09 | Discharge: 2021-11-16 | DRG: 522 | Disposition: A | Discharge status: Skilled Nursing Facility | Payer: Medicare Other | Source: Skilled Nursing Facility | Attending: Family Medicine | Admitting: Family Medicine

## 2021-11-09 ENCOUNTER — Other Ambulatory Visit: Payer: Self-pay

## 2021-11-09 ENCOUNTER — Emergency Department (HOSPITAL_COMMUNITY): Payer: Medicare Other

## 2021-11-09 ENCOUNTER — Encounter (HOSPITAL_COMMUNITY): Payer: Self-pay | Admitting: *Deleted

## 2021-11-09 DIAGNOSIS — R9431 Abnormal electrocardiogram [ECG] [EKG]: Secondary | ICD-10-CM | POA: Diagnosis present

## 2021-11-09 DIAGNOSIS — I421 Obstructive hypertrophic cardiomyopathy: Secondary | ICD-10-CM | POA: Diagnosis present

## 2021-11-09 DIAGNOSIS — Z7984 Long term (current) use of oral hypoglycemic drugs: Secondary | ICD-10-CM

## 2021-11-09 DIAGNOSIS — K222 Esophageal obstruction: Secondary | ICD-10-CM | POA: Diagnosis present

## 2021-11-09 DIAGNOSIS — I5032 Chronic diastolic (congestive) heart failure: Secondary | ICD-10-CM | POA: Diagnosis present

## 2021-11-09 DIAGNOSIS — Z794 Long term (current) use of insulin: Secondary | ICD-10-CM

## 2021-11-09 DIAGNOSIS — Z86718 Personal history of other venous thrombosis and embolism: Secondary | ICD-10-CM

## 2021-11-09 DIAGNOSIS — G8194 Hemiplegia, unspecified affecting left nondominant side: Secondary | ICD-10-CM | POA: Diagnosis present

## 2021-11-09 DIAGNOSIS — S72002A Fracture of unspecified part of neck of left femur, initial encounter for closed fracture: Principal | ICD-10-CM | POA: Diagnosis present

## 2021-11-09 DIAGNOSIS — K219 Gastro-esophageal reflux disease without esophagitis: Secondary | ICD-10-CM | POA: Diagnosis present

## 2021-11-09 DIAGNOSIS — I4891 Unspecified atrial fibrillation: Secondary | ICD-10-CM | POA: Diagnosis not present

## 2021-11-09 DIAGNOSIS — E1165 Type 2 diabetes mellitus with hyperglycemia: Secondary | ICD-10-CM | POA: Diagnosis present

## 2021-11-09 DIAGNOSIS — Z79899 Other long term (current) drug therapy: Secondary | ICD-10-CM

## 2021-11-09 DIAGNOSIS — Z87891 Personal history of nicotine dependence: Secondary | ICD-10-CM | POA: Diagnosis not present

## 2021-11-09 DIAGNOSIS — M1612 Unilateral primary osteoarthritis, left hip: Secondary | ICD-10-CM | POA: Diagnosis present

## 2021-11-09 DIAGNOSIS — L89222 Pressure ulcer of left hip, stage 2: Secondary | ICD-10-CM | POA: Diagnosis present

## 2021-11-09 DIAGNOSIS — Z515 Encounter for palliative care: Secondary | ICD-10-CM | POA: Diagnosis not present

## 2021-11-09 DIAGNOSIS — F32A Depression, unspecified: Secondary | ICD-10-CM | POA: Diagnosis present

## 2021-11-09 DIAGNOSIS — I951 Orthostatic hypotension: Secondary | ICD-10-CM | POA: Diagnosis present

## 2021-11-09 DIAGNOSIS — Z66 Do not resuscitate: Secondary | ICD-10-CM | POA: Diagnosis present

## 2021-11-09 DIAGNOSIS — Z8616 Personal history of COVID-19: Secondary | ICD-10-CM | POA: Diagnosis not present

## 2021-11-09 DIAGNOSIS — F419 Anxiety disorder, unspecified: Secondary | ICD-10-CM | POA: Diagnosis present

## 2021-11-09 DIAGNOSIS — E782 Mixed hyperlipidemia: Secondary | ICD-10-CM | POA: Diagnosis present

## 2021-11-09 DIAGNOSIS — Z8249 Family history of ischemic heart disease and other diseases of the circulatory system: Secondary | ICD-10-CM

## 2021-11-09 DIAGNOSIS — Z01810 Encounter for preprocedural cardiovascular examination: Secondary | ICD-10-CM | POA: Diagnosis not present

## 2021-11-09 DIAGNOSIS — S72002D Fracture of unspecified part of neck of left femur, subsequent encounter for closed fracture with routine healing: Secondary | ICD-10-CM | POA: Diagnosis not present

## 2021-11-09 DIAGNOSIS — Z882 Allergy status to sulfonamides status: Secondary | ICD-10-CM

## 2021-11-09 DIAGNOSIS — Z993 Dependence on wheelchair: Secondary | ICD-10-CM | POA: Diagnosis not present

## 2021-11-09 DIAGNOSIS — D62 Acute posthemorrhagic anemia: Secondary | ICD-10-CM | POA: Diagnosis not present

## 2021-11-09 DIAGNOSIS — I110 Hypertensive heart disease with heart failure: Secondary | ICD-10-CM | POA: Diagnosis present

## 2021-11-09 DIAGNOSIS — Z7189 Other specified counseling: Secondary | ICD-10-CM | POA: Diagnosis not present

## 2021-11-09 DIAGNOSIS — I10 Essential (primary) hypertension: Secondary | ICD-10-CM | POA: Diagnosis not present

## 2021-11-09 DIAGNOSIS — I4811 Longstanding persistent atrial fibrillation: Secondary | ICD-10-CM | POA: Diagnosis present

## 2021-11-09 DIAGNOSIS — F209 Schizophrenia, unspecified: Secondary | ICD-10-CM | POA: Diagnosis present

## 2021-11-09 DIAGNOSIS — I509 Heart failure, unspecified: Secondary | ICD-10-CM | POA: Diagnosis not present

## 2021-11-09 DIAGNOSIS — L8990 Pressure ulcer of unspecified site, unspecified stage: Secondary | ICD-10-CM | POA: Diagnosis present

## 2021-11-09 DIAGNOSIS — Z7901 Long term (current) use of anticoagulants: Secondary | ICD-10-CM

## 2021-11-09 LAB — CBG MONITORING, ED: Glucose-Capillary: 192 mg/dL — ABNORMAL HIGH (ref 70–99)

## 2021-11-09 LAB — CBC WITH DIFFERENTIAL/PLATELET
Abs Immature Granulocytes: 0.02 10*3/uL (ref 0.00–0.07)
Basophils Absolute: 0 10*3/uL (ref 0.0–0.1)
Basophils Relative: 0 %
Eosinophils Absolute: 0 10*3/uL (ref 0.0–0.5)
Eosinophils Relative: 0 %
HCT: 35.1 % — ABNORMAL LOW (ref 36.0–46.0)
Hemoglobin: 11.7 g/dL — ABNORMAL LOW (ref 12.0–15.0)
Immature Granulocytes: 0 %
Lymphocytes Relative: 8 %
Lymphs Abs: 0.9 10*3/uL (ref 0.7–4.0)
MCH: 28.6 pg (ref 26.0–34.0)
MCHC: 33.3 g/dL (ref 30.0–36.0)
MCV: 85.8 fL (ref 80.0–100.0)
Monocytes Absolute: 0.7 10*3/uL (ref 0.1–1.0)
Monocytes Relative: 6 %
Neutro Abs: 9 10*3/uL — ABNORMAL HIGH (ref 1.7–7.7)
Neutrophils Relative %: 86 %
Platelets: 172 10*3/uL (ref 150–400)
RBC: 4.09 MIL/uL (ref 3.87–5.11)
RDW: 15.2 % (ref 11.5–15.5)
WBC: 10.6 10*3/uL — ABNORMAL HIGH (ref 4.0–10.5)
nRBC: 0 % (ref 0.0–0.2)

## 2021-11-09 LAB — COMPREHENSIVE METABOLIC PANEL
ALT: 12 U/L (ref 0–44)
AST: 18 U/L (ref 15–41)
Albumin: 3.6 g/dL (ref 3.5–5.0)
Alkaline Phosphatase: 108 U/L (ref 38–126)
Anion gap: 6 (ref 5–15)
BUN: 12 mg/dL (ref 8–23)
CO2: 27 mmol/L (ref 22–32)
Calcium: 8.3 mg/dL — ABNORMAL LOW (ref 8.9–10.3)
Chloride: 104 mmol/L (ref 98–111)
Creatinine, Ser: 0.63 mg/dL (ref 0.44–1.00)
GFR, Estimated: >60 mL/min (ref >60)
Glucose, Bld: 181 mg/dL — ABNORMAL HIGH (ref 70–99)
Potassium: 3.8 mmol/L (ref 3.5–5.1)
Sodium: 137 mmol/L (ref 135–145)
Total Bilirubin: 1 mg/dL (ref 0.3–1.2)
Total Protein: 7.1 g/dL (ref 6.5–8.1)

## 2021-11-09 LAB — PROTIME-INR
INR: 1.3 — ABNORMAL HIGH (ref 0.8–1.2)
Prothrombin Time: 15.8 seconds — ABNORMAL HIGH (ref 11.4–15.2)

## 2021-11-09 MED ORDER — ACETAMINOPHEN 325 MG PO TABS
650.0000 mg | ORAL_TABLET | Freq: Four times a day (QID) | ORAL | Status: DC | PRN
  Administered 2021-11-09 – 2021-11-16 (×6): 650 mg via ORAL
  Filled 2021-11-09 (×6): qty 2

## 2021-11-09 MED ORDER — METOPROLOL TARTRATE 25 MG PO TABS
12.5000 mg | ORAL_TABLET | Freq: Two times a day (BID) | ORAL | Status: DC
  Administered 2021-11-09: 12.5 mg via ORAL
  Filled 2021-11-09: qty 1

## 2021-11-09 MED ORDER — RISPERIDONE 1 MG PO TABS
2.0000 mg | ORAL_TABLET | Freq: Every day | ORAL | Status: DC
  Administered 2021-11-09 – 2021-11-15 (×7): 2 mg via ORAL
  Filled 2021-11-09 (×7): qty 2

## 2021-11-09 MED ORDER — ATORVASTATIN CALCIUM 40 MG PO TABS
40.0000 mg | ORAL_TABLET | Freq: Every day | ORAL | Status: DC
  Administered 2021-11-09 – 2021-11-15 (×7): 40 mg via ORAL
  Filled 2021-11-09 (×7): qty 1

## 2021-11-09 MED ORDER — ACETAMINOPHEN 650 MG RE SUPP: 650.0000 mg | Freq: Four times a day (QID) | RECTAL | Status: DC | PRN

## 2021-11-09 MED ORDER — INSULIN GLARGINE-YFGN 100 UNIT/ML ~~LOC~~ SOLN
10.0000 [IU] | Freq: Every day | SUBCUTANEOUS | Status: DC
  Administered 2021-11-10 – 2021-11-14 (×5): 10 [IU] via SUBCUTANEOUS
  Filled 2021-11-09 (×6): qty 0.1

## 2021-11-09 MED ORDER — HYDROMORPHONE HCL 1 MG/ML IJ SOLN
0.5000 mg | INTRAMUSCULAR | Status: DC | PRN
  Administered 2021-11-09 – 2021-11-10 (×3): 0.5 mg via INTRAVENOUS
  Filled 2021-11-09 (×3): qty 0.5

## 2021-11-09 MED ORDER — PANTOPRAZOLE SODIUM 40 MG PO TBEC
40.0000 mg | DELAYED_RELEASE_TABLET | Freq: Two times a day (BID) | ORAL | Status: DC
  Administered 2021-11-10 – 2021-11-16 (×12): 40 mg via ORAL
  Filled 2021-11-09 (×13): qty 1

## 2021-11-09 MED ORDER — INSULIN ASPART 100 UNIT/ML IJ SOLN
0.0000 [IU] | Freq: Every day | INTRAMUSCULAR | Status: DC
  Administered 2021-11-10: 2 [IU] via SUBCUTANEOUS
  Administered 2021-11-11: 3 [IU] via SUBCUTANEOUS
  Administered 2021-11-13 – 2021-11-14 (×2): 2 [IU] via SUBCUTANEOUS

## 2021-11-09 MED ORDER — SODIUM CHLORIDE 0.9 % IV SOLN: INTRAVENOUS | Status: DC

## 2021-11-09 MED ORDER — HYDROMORPHONE HCL 1 MG/ML IJ SOLN
0.5000 mg | Freq: Once | INTRAMUSCULAR | Status: AC
  Administered 2021-11-09: 0.5 mg via INTRAVENOUS
  Filled 2021-11-09: qty 0.5

## 2021-11-09 MED ORDER — DULOXETINE HCL 60 MG PO CPEP
60.0000 mg | ORAL_CAPSULE | Freq: Every day | ORAL | Status: DC
  Administered 2021-11-10 – 2021-11-16 (×6): 60 mg via ORAL
  Filled 2021-11-09 (×7): qty 1

## 2021-11-09 MED ORDER — RISPERIDONE 1 MG PO TABS
1.0000 mg | ORAL_TABLET | ORAL | Status: DC
  Administered 2021-11-10 – 2021-11-16 (×12): 1 mg via ORAL
  Filled 2021-11-09 (×13): qty 1

## 2021-11-09 MED ORDER — AMIODARONE HCL 200 MG PO TABS
200.0000 mg | ORAL_TABLET | Freq: Every day | ORAL | Status: DC
  Administered 2021-11-10 – 2021-11-13 (×3): 200 mg via ORAL
  Filled 2021-11-09 (×4): qty 1

## 2021-11-09 MED ORDER — INSULIN ASPART 100 UNIT/ML IJ SOLN
0.0000 [IU] | Freq: Three times a day (TID) | INTRAMUSCULAR | Status: DC
  Administered 2021-11-10 (×3): 2 [IU] via SUBCUTANEOUS
  Administered 2021-11-11: 7 [IU] via SUBCUTANEOUS
  Administered 2021-11-12: 3 [IU] via SUBCUTANEOUS
  Administered 2021-11-12: 2 [IU] via SUBCUTANEOUS
  Administered 2021-11-12: 1 [IU] via SUBCUTANEOUS
  Administered 2021-11-13 (×3): 2 [IU] via SUBCUTANEOUS
  Administered 2021-11-14: 3 [IU] via SUBCUTANEOUS
  Administered 2021-11-14: 2 [IU] via SUBCUTANEOUS
  Administered 2021-11-14: 3 [IU] via SUBCUTANEOUS

## 2021-11-09 NOTE — ED Provider Notes (Signed)
[facility] EMERGENCY DEPARTMENT Provider Note   CSN: [id number] Arrival date & time: 11/09/21  1534     History  Chief Complaint  Patient presents with   [name]    [name] is a 76 y.o. female.  Patient brought in by EMS from [place] in place.  Patient had 2 falls today they did a portable x-ray there showed left hip fracture.  Patient reportedly on Eliquis.  Past medical history significant for history of DVTs type 2 diabetes history of stroke in 2010 schizophrenia atrial fibrillation patient is on Eliquis hyperlipidemia congestive heart failure hypertension.  Patient is a former smoker quit in 1997.  Patient does complain of left hip pain only.       Home Medications Prior to Admission medications   Medication Sig Start Date End Date Taking? Authorizing Provider  ABILIFY 2 MG tablet Take 1 tablet (2 mg total) by mouth daily. 11/09/20  Yes [name], MD  acetaminophen (TYLENOL) 325 MG tablet Take 650 mg by mouth 3 (three) times daily as needed for mild pain or moderate pain.    Yes [provider]  albuterol (VENTOLIN HFA) 108 (90 Base) MCG/ACT inhaler Inhale 2 puffs into the lungs every 6 (six) hours as needed for wheezing or shortness of breath. 05/20/21  Yes Barton Dubois, MD  amiodarone (PACERONE) 200 MG tablet Take 1 tablet (200 mg total) by mouth daily. 04/24/21  Yes Barton Dubois, MD  apixaban (ELIQUIS) 5 MG TABS tablet Take 1 tablet (5 mg total) by mouth 2 (two) times daily. 02/21/20  Yes Richarda Osmond, MD  atorvastatin (LIPITOR) 40 MG tablet Take 1 tablet by mouth at bedtime. 05/30/20  Yes [provider]  busPIRone (BUSPAR) 7.5 MG tablet Take 7.5 mg by mouth 2 (two) times daily. 08/31/21  Yes [provider]  calcium carbonate (TUMS - DOSED IN MG ELEMENTAL CALCIUM) 500 MG chewable tablet Chew 1 tablet by mouth 4 (four) times daily as needed for heartburn.   Yes [provider]  cholecalciferol (VITAMIN D3) 25 MCG (1000 UNIT)  tablet Take 2,000 Units by mouth daily.   Yes [provider]  clonazePAM (KLONOPIN) 0.5 MG tablet Take 1 tablet (0.5 mg total) by mouth 3 (three) times daily as needed for anxiety. 10/13/21  Yes Tat, Shanon Brow, MD  DULoxetine (CYMBALTA) 60 MG capsule Take 1 capsule by mouth daily. 08/26/21  Yes [provider]  HYDROcodone-acetaminophen (NORCO) 10-325 MG tablet Take 1 tablet by mouth every 8 (eight) hours as needed for severe pain. 10/13/21  Yes Tat, Shanon Brow, MD  insulin detemir (LEVEMIR FLEXTOUCH) 100 UNIT/ML FlexPen INJECT 22 UNITS SUBCUTANEOUSLY AT BEDTIME.(HOLD IF BS<70: CALL MD IF BS> 400) Patient taking differently: 22 Units at bedtime. 07/15/21  Yes Reardon, Juanetta Beets, NP  latanoprost (XALATAN) 0.005 % ophthalmic solution Place 1 drop into both eyes at bedtime.   Yes [provider]  lubiprostone (AMITIZA) 8 MCG capsule Take 1 capsule by mouth daily. 08/10/21  Yes [provider]  metoprolol tartrate (LOPRESSOR) 25 MG tablet Take 12.5 mg by mouth 2 (two) times daily.   Yes [provider]  midodrine (PROAMATINE) 10 MG tablet Take 10 mg by mouth 3 (three) times daily.   Yes [provider]  mirtazapine (REMERON) 30 MG tablet Take 30 mg by mouth at bedtime.   Yes [provider]  Multiple Vitamin (MULTIVITAMIN) tablet Take 1 tablet by mouth daily.   Yes [provider]  naloxegol oxalate (MOVANTIK) 12.5 MG  TABS tablet Take 1 tablet (12.5 mg total) by mouth daily. 04/24/21  Yes Barton Dubois, MD  NOVOLOG FLEXPEN 100 UNIT/ML FlexPen INJECT SUBCUTANEOUSLY AS FOLLOWS WITH MEALS: 90-150=10u: 151-200=11u: 201-250=12u: 251-300=13u: 301-350=14u: 351-400=15u: BS>400=16u & CALL MD. Patient taking differently: 10-16 Units 3 (three) times daily with meals. INJECT SUBCUTANEOUSLY AS FOLLOWS WITH MEALS: 90-150=10u: 151-200=11u: 201-250=12u: 251-300=13u: 301-350=14u: 351-400=15u: BS>400=16u & CALL MD. 02/28/21  Yes Reardon, Juanetta Beets, NP  ondansetron  (ZOFRAN) 4 MG tablet Take 4 mg by mouth 2 (two) times daily. 06/24/21  Yes [provider]  oxybutynin (DITROPAN-XL) 5 MG 24 hr tablet Take 5 mg by mouth daily. 04/20/21  Yes [provider]  pantoprazole (PROTONIX) 40 MG tablet Take 1 tablet (40 mg total) by mouth 2 (two) times daily before a meal. 11/17/20  Yes Mahala Menghini, PA-C  polyethylene glycol (MIRALAX / GLYCOLAX) 17 g packet Take 17 g by mouth daily. 04/24/21  Yes Barton Dubois, MD  risperiDONE (RISPERDAL) 1 MG tablet Take 1 mg by mouth 2 (two) times daily. Take one tablet at 900am and 1 tablet at 5pm and '2mg'$  at bedtime   Yes [provider]  risperiDONE (RISPERDAL) 2 MG tablet Take 2 mg by mouth at bedtime. Take '2mg'$  at bedtime and '1mg'$  at 9am and 5p daily 04/04/21  Yes [provider]  senna (SENOKOT) 8.6 MG tablet Take 1 tablet by mouth daily.   Yes [provider]  TRADJENTA 5 MG TABS tablet TAKE (1) TABLET BY MOUTH ONCE DAILY. Patient taking differently: Take 5 mg by mouth daily. 04/04/21  Yes Brita Romp, NP  Skin Protectants, Misc. (BAZA PROTECT EX) Apply 1 Application topically in the morning and at bedtime. 08/02/21   [provider]      Allergies    Sulfa antibiotics    Review of Systems   Review of Systems  Constitutional:  Negative for chills and fever.  HENT:  Negative for ear pain and sore throat.   Eyes:  Negative for pain and visual disturbance.  Respiratory:  Negative for cough and shortness of breath.   Cardiovascular:  Negative for chest pain and palpitations.  Gastrointestinal:  Negative for abdominal pain and vomiting.  Genitourinary:  Negative for dysuria and hematuria.  Musculoskeletal:  Negative for arthralgias and back pain.  Skin:  Negative for color change and rash.  Neurological:  Negative for seizures and syncope.  Hematological:  Bruises/bleeds easily.  All other systems reviewed and are negative.   Physical Exam Updated Vital Signs BP (!)  140/62   Pulse 89   Resp (!) 28   Ht 1.651 m ('5\' 5"'$ )   Wt 54 kg   SpO2 93%   BMI 19.80 kg/m  Physical Exam Vitals and nursing note reviewed.  Constitutional:      General: She is not in acute distress.    Appearance: Normal appearance. She is well-developed.  HENT:     Head: Normocephalic and atraumatic.     Mouth/Throat:     Mouth: Mucous membranes are moist.  Eyes:     Extraocular Movements: Extraocular movements intact.     Conjunctiva/sclera: Conjunctivae normal.     Pupils: Pupils are equal, round, and reactive to light.  Cardiovascular:     Rate and Rhythm: Normal rate and regular rhythm.     Heart sounds: No murmur heard. Pulmonary:     Effort: Pulmonary effort is normal. No respiratory distress.     Breath sounds: Normal breath sounds.  Abdominal:  Palpations: Abdomen is soft.     Tenderness: There is no abdominal tenderness.  Musculoskeletal:        General: Tenderness present. No swelling.     Cervical back: Normal range of motion and neck supple.     Right lower leg: No edema.     Left lower leg: No edema.     Comments: Left hip.  Both lower extremities good cap refill dorsalis pedis pulse 1+ sensation grossly intact.  Skin:    General: Skin is warm and dry.     Capillary Refill: Capillary refill takes less than 2 seconds.  Neurological:     General: No focal deficit present.     Mental Status: She is alert and oriented to person, place, and time.     Cranial Nerves: No cranial nerve deficit.     Sensory: No sensory deficit.     Motor: No weakness.  Psychiatric:        Mood and Affect: Mood normal.     ED Results / Procedures / Treatments   Labs (all labs ordered are listed, but only abnormal results are displayed) Labs Reviewed  CBC WITH DIFFERENTIAL/PLATELET - Abnormal; Notable for the following components:      Result Value   WBC 10.6 (*)    Hemoglobin 11.7 (*)    HCT 35.1 (*)    Neutro Abs 9.0 (*)    All other components within normal  limits  URINALYSIS, ROUTINE W REFLEX MICROSCOPIC  PROTIME-INR  COMPREHENSIVE METABOLIC PANEL    EKG EKG Interpretation  Date/Time:  Wednesday November 09 2021 15:52:02 EDT Ventricular Rate:  103 PR Interval:    QRS Duration: 111 QT Interval:  382 QTC Calculation: 501 R Axis:   52 Text Interpretation: Atrial fibrillation Ventricular premature complex Low voltage, extremity and precordial leads Consider anterior infarct Prolonged QT interval Confirmed by Fredia Sorrow [phone]) on 11/09/2021 5:45:35 PM  Radiology CT Head Wo Contrast  Result Date: 11/09/2021 CLINICAL DATA:  Head trauma EXAM: CT HEAD WITHOUT CONTRAST TECHNIQUE: Contiguous axial images were obtained from the base of the skull through the vertex without intravenous contrast. RADIATION DOSE REDUCTION: This exam was performed according to the departmental dose-optimization program which includes automated exposure control, adjustment of the mA and/or kV according to patient size and/or use of iterative reconstruction technique. COMPARISON:  CT brain 10/10/2021, 09/21/2021 FINDINGS: Brain: No acute territorial infarction, hemorrhage, or intracranial mass. The ventricles are nonenlarged. Advanced chronic small vessel ischemic changes of the white matter. Vascular: No hyperdense vessels.  Carotid vascular calcification Skull: Normal. Negative for fracture or focal lesion. Sinuses/Orbits: Postsurgical changes of the maxillary and ethmoid sinuses. Other: Chronic anterior subluxation of the mandibular heads. IMPRESSION: 1. No CT evidence for acute intracranial abnormality. 2. Advanced chronic small vessel ischemic changes of the white matter. Electronically Signed   By: Donavan Foil M.D.   On: 11/09/2021 19:24   DG Chest Port 1 View  Result Date: 11/09/2021 CLINICAL DATA:  Fall pain in left hip EXAM: PORTABLE CHEST 1 VIEW COMPARISON:  10/10/2021 FINDINGS: Right CP angle is non included. No sizable effusion. Mild cardiomegaly. Mild  diffuse chronic interstitial opacity. Aortic atherosclerosis. Suspect bilateral skin fold artifacts over pneumothoraces. IMPRESSION: 1. Cardiomegaly. 2. Bilateral skin fold artifacts are favored over pneumothoraces. Electronically Signed   By: Donavan Foil M.D.   On: 11/09/2021 18:45   DG Hip Unilat W or Wo Pelvis 2-3 Views Left  Result Date: 11/09/2021 CLINICAL DATA:  Fall.  Left hip pain  EXAM: DG HIP (WITH OR WITHOUT PELVIS) 2-3V LEFT COMPARISON:  03/09/2021 FINDINGS: Diffuse osseous demineralization. Acute fracture of the left femoral neck with mild superolateral displacement and varus angulation. No dislocation. Pelvic bony ring appears intact. IMPRESSION: Acute fracture of the left femoral neck. Electronically Signed   By: Davina Poke D.O.   On: 11/09/2021 18:42    Procedures Procedures    Medications Ordered in ED Medications  0.9 %  sodium chloride infusion ( Intravenous New Bag/Given 11/09/21 1726)  HYDROmorphone (DILAUDID) injection 0.5 mg (0.5 mg Intravenous Given 11/09/21 1727)    ED Course/ Medical Decision Making/ A&P                           Medical Decision Making Amount and/or Complexity of Data Reviewed Labs: ordered. Radiology: ordered.  Risk Prescription drug management. Decision regarding hospitalization.  X-ray here does show left femoral neck fracture.  Chest x-ray without any acute findings other than cardiomegaly.  CT head without any acute intracranial injuries.  CBC white count 10.6 hemoglobin 11.7 Rest of her labs are still pending.  Discussed with Dr. Aline Brochure planning surgery for Friday.     Final Clinical Impression(s) / ED Diagnoses Final diagnoses:  Closed fracture of left hip, initial encounter [facility])    Rx / [place] Orders ED Discharge Orders     None         Fredia Sorrow, MD 11/09/21 1944

## 2021-11-09 NOTE — ED Notes (Signed)
Pt place on O2 at 2 L/M [place] due to RA sats at 89 %.  Sats increased to 92% with O2

## 2021-11-09 NOTE — H&P (Signed)
History and Physical    Patient: [name] EXH:[id number] DOB: [birth year] DOA: 11/09/2021 DOS: the patient was seen and examined on 11/09/2021 PCP: Moss Mc, NP  Patient coming from: SNF  Chief Complaint:  Chief Complaint  Patient presents with   Fall   HPI: [name] is a 76 y.o. female with medical history significant of hypertension, T2DM, A-fib on Eliquis, schizophrenia who presents to the emergency department from [place] via EMS due to 2 falls today.  X-ray of the hip was done and patient was noted to have left femoral neck fracture.  EMS was activated and patient was sent to the ED for further evaluation and management.  Patient denies hitting her head when she fell, she denies chest pain, shortness of breath, fever, chills, nausea or vomiting.  ED Course:  In the emergency department, she was intermittently tachypneic and tachycardic.  BP was 131/68, O2 sat was 94% on room air.  Work-up in the ED showed WBC 10.6, hemoglobin 11.7, hematocrit 35.1, MCV 85.8, platelets 172.  BMP was normal except for blood glucose of 181. CT head without contrast showed no evidence for acute intracranial abnormality Chest x-ray showed cardiomegaly.  Bilateral skinfold artifacts are favored over pneumothoraces Left hip x-ray showed acute fracture of the left femoral neck Patient was treated with IV Dilaudid 0.5 mg x 1.  Orthopedic surgeon (Dr. Aline Brochure) was consulted and recommended admitting patient with plan to operate on patient on Friday (10/20).  Hospitalist was asked to admit patient for further evaluation and management.  Review of Systems: Review of systems as noted in the HPI. All other systems reviewed and are negative.   Past Medical History:  Diagnosis Date   Anxiety    Arthritis    Atrial fibrillation (HCC)    Atrial flutter (HCC)    CHF (congestive heart failure) (HCC)    Collagen vascular disease ([place])    COVID-19 virus infection    COVID-19+  approx 01/24/19; asymptomatic course with full recovery   Dependence on wheelchair    pivot/transfers   Depression    History of psychosis and previous suicide attempt   DVT, lower extremity, recurrent ([place])    Long-term Coumadin per Dr. Legrand Rams   Essential hypertension    GERD (gastroesophageal reflux disease)    Hemiplegia ([place]) 2010   Left side   History of stroke    Acute infarct and right cerebral white matter small vessel disease 12/10   Hyperlipidemia    Hypotension, unspecified    IDA (iron deficiency anemia)    Irritable bowel syndrome without diarrhea    Leg DVT (deep venous thromboembolism), acute ([place]) 2006   Obstructive hypertrophic cardiomyopathy (HCC)    Orthostatic hypotension    Schizophrenia ([place])    Stroke ([place])    left sided weakness   Type 2 diabetes mellitus ([place])    Vitamin D deficiency    Past Surgical History:  Procedure Laterality Date   BACK SURGERY     BIOPSY N/A 11/24/2014   Procedure: BIOPSY;  Surgeon: Danie Binder, MD;  Location: [facility];  Service: Endoscopy;  Laterality: N/A;   BIOPSY  05/17/2016   Procedure: BIOPSY;  Surgeon: Danie Binder, MD;  [facility];  Service: Endoscopy;;  gastric biopsy   COLONOSCOPY WITH PROPOFOL N/A 11/24/2014   Dr. Rudie Meyer polyps removed/moderate sized internal hemorrhoids, tubular adenomas. Next surveillance in 3 years   ESOPHAGEAL DILATION N/A 07/23/2021   Procedure: ESOPHAGEAL DILATION;  Surgeon: Abbey Chatters,  Elon Alas, DO;  [facility];  Service: Endoscopy;  Laterality: N/A;   ESOPHAGOGASTRODUODENOSCOPY (EGD) WITH PROPOFOL N/A 11/24/2014   Dr. Clayburn Pert HH/patent stricture at the gastroesophageal junction, mild non-erosive gastritis, path negative for H.pylori or celiac sprue   ESOPHAGOGASTRODUODENOSCOPY (EGD) WITH PROPOFOL N/A 05/17/2016   Procedure: ESOPHAGOGASTRODUODENOSCOPY (EGD) WITH PROPOFOL;  Surgeon: Danie Binder, MD;  [facility];  Service: Endoscopy;  Laterality: N/A;   12:45pm   ESOPHAGOGASTRODUODENOSCOPY (EGD) WITH PROPOFOL N/A 07/23/2021   Procedure: ESOPHAGOGASTRODUODENOSCOPY (EGD) WITH PROPOFOL;  Surgeon: Eloise Harman, DO;  [facility];  Service: Endoscopy;  Laterality: N/A;   GIVENS CAPSULE STUDY N/A 12/11/2014   MULTILPLE EROSION IN the stomach WITH ACTIVE OOZING. OCCASIONAL EROSIONS AND RARE ULCER SEEN IN PROXIMAL SMALL BOWEL . No masses or AVMs SEEN. NO OLD BLOOD OR FRESH BLOOD SEEN.    POLYPECTOMY N/A 11/24/2014   Procedure: POLYPECTOMY;  Surgeon: Danie Binder, MD;  Location: [facility];  Service: Endoscopy;  Laterality: N/A;   SAVORY DILATION N/A 05/17/2016   Procedure: SAVORY DILATION;  Surgeon: Danie Binder, MD;  [facility];  Service: Endoscopy;  Laterality: N/A;    Social History:  reports that she quit smoking about 26 years ago. Her smoking use included cigarettes. She has a 5.00 pack-year smoking history. She has never used smokeless tobacco. She reports that she does not drink alcohol and does not use drugs.   Allergies  Allergen Reactions   Sulfa Antibiotics Rash    Family History  Problem Relation Age of Onset   Hypertension Mother    Colon cancer Neg Hx      Prior to Admission medications   Medication Sig Start Date End Date Taking? Authorizing Provider  ABILIFY 2 MG tablet Take 1 tablet (2 mg total) by mouth daily. 11/09/20  Yes [name], MD  acetaminophen (TYLENOL) 325 MG tablet Take 650 mg by mouth 3 (three) times daily as needed for mild pain or moderate pain.    Yes [provider]  albuterol (VENTOLIN HFA) 108 (90 Base) MCG/ACT inhaler Inhale 2 puffs into the lungs every 6 (six) hours as needed for wheezing or shortness of breath. 05/20/21  Yes Barton Dubois, MD  amiodarone (PACERONE) 200 MG tablet Take 1 tablet (200 mg total) by mouth daily. 04/24/21  Yes Barton Dubois, MD  apixaban (ELIQUIS) 5 MG TABS tablet Take 1 tablet (5 mg total) by mouth 2 (two) times daily. 02/21/20  Yes  Richarda Osmond, MD  atorvastatin (LIPITOR) 40 MG tablet Take 1 tablet by mouth at bedtime. 05/30/20  Yes [provider]  busPIRone (BUSPAR) 7.5 MG tablet Take 7.5 mg by mouth 2 (two) times daily. 08/31/21  Yes [provider]  calcium carbonate (TUMS - DOSED IN MG ELEMENTAL CALCIUM) 500 MG chewable tablet Chew 1 tablet by mouth 4 (four) times daily as needed for heartburn.   Yes [provider]  cholecalciferol (VITAMIN D3) 25 MCG (1000 UNIT) tablet Take 2,000 Units by mouth daily.   Yes [provider]  clonazePAM (KLONOPIN) 0.5 MG tablet Take 1 tablet (0.5 mg total) by mouth 3 (three) times daily as needed for anxiety. 10/13/21  Yes Tat, Shanon Brow, MD  DULoxetine (CYMBALTA) 60 MG capsule Take 1 capsule by mouth daily. 08/26/21  Yes [provider]  HYDROcodone-acetaminophen (NORCO) 10-325 MG tablet Take 1 tablet by mouth every 8 (eight) hours as needed for severe pain. 10/13/21  Deveron Furlong, MD  insulin detemir (LEVEMIR FLEXTOUCH) 100 UNIT/ML FlexPen INJECT 22 UNITS SUBCUTANEOUSLY AT BEDTIME.(HOLD IF BS<70: CALL MD IF BS> 400) Patient taking differently: 22 Units at bedtime. 07/15/21  Yes Reardon, Juanetta Beets, NP  latanoprost (XALATAN) 0.005 % ophthalmic solution Place 1 drop into both eyes at bedtime.   Yes [provider]  lubiprostone (AMITIZA) 8 MCG capsule Take 1 capsule by mouth daily. 08/10/21  Yes [provider]  metoprolol tartrate (LOPRESSOR) 25 MG tablet Take 12.5 mg by mouth 2 (two) times daily.   Yes [provider]  midodrine (PROAMATINE) 10 MG tablet Take 10 mg by mouth 3 (three) times daily.   Yes [provider]  mirtazapine (REMERON) 30 MG tablet Take 30 mg by mouth at bedtime.   Yes [provider]  Multiple Vitamin (MULTIVITAMIN) tablet Take 1 tablet by mouth daily.   Yes [provider]  naloxegol oxalate (MOVANTIK) 12.5 MG TABS tablet Take 1 tablet (12.5 mg total) by mouth daily.  04/24/21  Yes Barton Dubois, MD  NOVOLOG FLEXPEN 100 UNIT/ML FlexPen INJECT SUBCUTANEOUSLY AS FOLLOWS WITH MEALS: 90-150=10u: 151-200=11u: 201-250=12u: 251-300=13u: 301-350=14u: 351-400=15u: BS>400=16u & CALL MD. Patient taking differently: 10-16 Units 3 (three) times daily with meals. INJECT SUBCUTANEOUSLY AS FOLLOWS WITH MEALS: 90-150=10u: 151-200=11u: 201-250=12u: 251-300=13u: 301-350=14u: 351-400=15u: BS>400=16u & CALL MD. 02/28/21  Yes Reardon, Juanetta Beets, NP  ondansetron (ZOFRAN) 4 MG tablet Take 4 mg by mouth 2 (two) times daily. 06/24/21  Yes [provider]  oxybutynin (DITROPAN-XL) 5 MG 24 hr tablet Take 5 mg by mouth daily. 04/20/21  Yes [provider]  pantoprazole (PROTONIX) 40 MG tablet Take 1 tablet (40 mg total) by mouth 2 (two) times daily before a meal. 11/17/20  Yes Mahala Menghini, PA-C  polyethylene glycol (MIRALAX / GLYCOLAX) 17 g packet Take 17 g by mouth daily. 04/24/21  Yes Barton Dubois, MD  risperiDONE (RISPERDAL) 1 MG tablet Take 1 mg by mouth 2 (two) times daily. Take one tablet at 900am and 1 tablet at 5pm and '2mg'$  at bedtime   Yes [provider]  risperiDONE (RISPERDAL) 2 MG tablet Take 2 mg by mouth at bedtime. Take '2mg'$  at bedtime and '1mg'$  at 9am and 5p daily 04/04/21  Yes [provider]  senna (SENOKOT) 8.6 MG tablet Take 1 tablet by mouth daily.   Yes [provider]  TRADJENTA 5 MG TABS tablet TAKE (1) TABLET BY MOUTH ONCE DAILY. Patient taking differently: Take 5 mg by mouth daily. 04/04/21  Yes Brita Romp, NP  Skin Protectants, Misc. (BAZA PROTECT EX) Apply 1 Application topically in the morning and at bedtime. 08/02/21   [provider]    Physical Exam: BP 117/72   Pulse (!) 111   Temp 98 F (36.7 C) (Oral)   Resp 18   Ht '5\' 5"'$  (1.651 m)   Wt 54 kg   SpO2 93%   BMI 19.80 kg/m   General: 76 y.o. year-old female well developed well nourished in no acute distress.  Alert and oriented x3. HEENT: NCAT,  EOMI Neck: Supple, trachea medial Cardiovascular: Regular rate and rhythm with no rubs or gallops.  No thyromegaly or JVD noted.  No lower extremity edema. 2/4 pulses in all 4 extremities. Respiratory: Clear to auscultation with no wheezes or rales. Good inspiratory effort. Abdomen: Soft, nontender nondistended with normal bowel sounds x4 quadrants. Muskuloskeletal: Tender to palpation of left hip.  Restricted ROM of LE due to pain.  No cyanosis or clubbing noted  bilaterally Neuro: CN II-XII intact, sensation, reflexes intact Skin: No ulcerative lesions noted or rashes Psychiatry: Mood is appropriate for condition and setting          Labs on Admission:  Basic Metabolic Panel: Recent Labs  Lab 11/09/21 2006  NA 137  K 3.8  CL 104  CO2 27  GLUCOSE 181*  BUN 12  CREATININE 0.63  CALCIUM 8.3*   Liver Function Tests: Recent Labs  Lab 11/09/21 2006  AST 18  ALT 12  ALKPHOS 108  BILITOT 1.0  PROT 7.1  ALBUMIN 3.6   No results for input(s): "LIPASE", "AMYLASE" in the last 168 hours. No results for input(s): "AMMONIA" in the last 168 hours. CBC: Recent Labs  Lab 11/09/21 1737  WBC 10.6*  NEUTROABS 9.0*  HGB 11.7*  HCT 35.1*  MCV 85.8  PLT 172   Cardiac Enzymes: No results for input(s): "CKTOTAL", "CKMB", "CKMBINDEX", "TROPONINI" in the last 168 hours.  BNP (last 3 results) Recent Labs    05/18/21 0923 08/13/21 1059 09/13/21 2158  BNP 348.0* 297.0* 229.0*    ProBNP (last 3 results) No results for input(s): "PROBNP" in the last 8760 hours.  CBG: No results for input(s): "GLUCAP" in the last 168 hours.  Radiological Exams on Admission: CT Head Wo Contrast  Result Date: 11/09/2021 CLINICAL DATA:  Head trauma EXAM: CT HEAD WITHOUT CONTRAST TECHNIQUE: Contiguous axial images were obtained from the base of the skull through the vertex without intravenous contrast. RADIATION DOSE REDUCTION: This exam was performed according to the departmental dose-optimization  program which includes automated exposure control, adjustment of the mA and/or kV according to patient size and/or use of iterative reconstruction technique. COMPARISON:  CT brain 10/10/2021, 09/21/2021 FINDINGS: Brain: No acute territorial infarction, hemorrhage, or intracranial mass. The ventricles are nonenlarged. Advanced chronic small vessel ischemic changes of the white matter. Vascular: No hyperdense vessels.  Carotid vascular calcification Skull: Normal. Negative for fracture or focal lesion. Sinuses/Orbits: Postsurgical changes of the maxillary and ethmoid sinuses. Other: Chronic anterior subluxation of the mandibular heads. IMPRESSION: 1. No CT evidence for acute intracranial abnormality. 2. Advanced chronic small vessel ischemic changes of the white matter. Electronically Signed   By: Donavan Foil M.D.   On: 11/09/2021 19:24   DG Chest Port 1 View  Result Date: 11/09/2021 CLINICAL DATA:  Fall pain in left hip EXAM: PORTABLE CHEST 1 VIEW COMPARISON:  10/10/2021 FINDINGS: Right CP angle is non included. No sizable effusion. Mild cardiomegaly. Mild diffuse chronic interstitial opacity. Aortic atherosclerosis. Suspect bilateral skin fold artifacts over pneumothoraces. IMPRESSION: 1. Cardiomegaly. 2. Bilateral skin fold artifacts are favored over pneumothoraces. Electronically Signed   By: Donavan Foil M.D.   On: 11/09/2021 18:45   DG Hip Unilat W or Wo Pelvis 2-3 Views Left  Result Date: 11/09/2021 CLINICAL DATA:  Fall.  Left hip pain EXAM: DG HIP (WITH OR WITHOUT PELVIS) 2-3V LEFT COMPARISON:  03/09/2021 FINDINGS: Diffuse osseous demineralization. Acute fracture of the left femoral neck with mild superolateral displacement and varus angulation. No dislocation. Pelvic bony ring appears intact. IMPRESSION: Acute fracture of the left femoral neck. Electronically Signed   By: Davina Poke D.O.   On: 11/09/2021 18:42    EKG: I independently viewed the EKG done and my findings are as followed:  A-fib with RVR with prolonged QTc 501 ms  Assessment/Plan Present on Admission:  Fracture of femoral neck, left, closed (HCC)  Atrial fibrillation with RVR (HCC)  Prolonged QT interval  Essential hypertension  Mixed hyperlipidemia  Schizophrenia (HCC)  GERD (gastroesophageal reflux disease)  Principal Problem:   Fracture of femoral neck, left, closed (HCC) Active Problems:   Schizophrenia (HCC)   Depression   GERD (gastroesophageal reflux disease)   Atrial fibrillation with RVR (HCC)   Stricture and stenosis of esophagus   Essential hypertension   Mixed hyperlipidemia   Prolonged QT interval   Chronic diastolic CHF (congestive heart failure) (HCC)   Closed left femoral neck fracture Left hip x-ray showed acute fracture of the left femoral neck Continue IV Dilaudid 0.5 mg every 4 hours as needed for moderate/severe pain Continue fall precaution and neurocheck Orthopedic surgeon already consulted and will follow-up with patient in the morning with plan to operate on patient on Friday (10/20) per ED physician Consider PT/OT eval and treat status post surgical repair  Chronic atrial fibrillation with RVR Continue amiodarone Eliquis will be held at this time in anticipation for surgical repair on Friday (10/20)  Prolonged QT interval QTc 564m Avoid QT prolonging drugs Magnesium level will be checked Continue telemetry  T2DM with hyperglycemia Continue Semglee 10 units nightly and adjust dose accordingly Continue ISS and hypoglycemia protocol  GERD Continue Protonix  Chronic diastolic CHF Patient appears euvolemic clinically Echocardiogram done in August of this year showed LVEF of 65 to 70%, no RWMA, +HOCM Continue total input/output, daily weights and fluid restriction  Stricture and stenosis of esophagus EGD done on 07/23/2021 showed  food in the middle third and lower third of the esophagus.   A distal esophageal stricture was noted. Barium swallow done on  07/25/2021 showed nonspecific esophageal dysmotility with omission of primary peristaltic waves in the mid esophagus, and tertiary contractions in the esophagus Continue Protonix Continue dysphagia 1 diet  Essential hypertension Continue metoprolol  Mixed hyperlipidemia Continue Lipitor  Schizophrenia Continue Risperdal  Depression Continue Cymbalta  DVT prophylaxis: SCDs  Code Status: DNR  Consults: None  Family Communication: None at bedside  Severity of Illness: The appropriate patient status for this patient is INPATIENT. Inpatient status is judged to be reasonable and necessary in order to provide the required intensity of service to ensure the patient's safety. The patient's presenting symptoms, physical exam findings, and initial radiographic and laboratory data in the context of their chronic comorbidities is felt to place them at high risk for further clinical deterioration. Furthermore, it is not anticipated that the patient will be medically stable for discharge from the hospital within 2 midnights of admission.   * I certify that at the point of admission it is my clinical judgment that the patient will require inpatient hospital care spanning beyond 2 midnights from the point of admission due to high intensity of service, high risk for further deterioration and high frequency of surveillance required.*  Author: OBernadette Hoit DO 11/09/2021 9:45 PM  For on call review [email or web address]

## 2021-11-09 NOTE — ED Triage Notes (Signed)
Reported that pt fell x 2 today and portable xray showed a left hip fracture.

## 2021-11-09 NOTE — ED Notes (Signed)
Pt denies hitting her head with her falls.  Pt A&O to name, place, month and situation.

## 2021-11-09 NOTE — Progress Notes (Signed)
Patient ID: [name], female   DOB: Nov 12, 1945, 76 y.o.   MRN: [id number]  The px is on eloquis, surgery will be delayed until Friday   She will need a partial hip replacement

## 2021-11-09 NOTE — Consult Note (Signed)
Reason for Consult:*** Referring Physician: ***  [name] is an 76 y.o. female.  HPI: ***  Past Medical History:  Diagnosis Date   Anxiety    Arthritis    Atrial fibrillation (HCC)    Atrial flutter (HCC)    CHF (congestive heart failure) (HCC)    Collagen vascular disease (HCC)    COVID-19 virus infection    COVID-19+ approx 01/24/19; asymptomatic course with full recovery   Dependence on wheelchair    pivot/transfers   Depression    History of psychosis and previous suicide attempt   DVT, lower extremity, recurrent (HCC)    Long-term Coumadin per Dr. [name]   Essential hypertension    GERD (gastroesophageal reflux disease)    Hemiplegia (HCC) 2010   Left side   History of stroke    Acute infarct and right cerebral white matter small vessel disease 12/10   Hyperlipidemia    Hypotension, unspecified    IDA (iron deficiency anemia)    Irritable bowel syndrome without diarrhea    Leg DVT (deep venous thromboembolism), acute (HCC) 2006   Obstructive hypertrophic cardiomyopathy (HCC)    Orthostatic hypotension    Schizophrenia (HCC)    Stroke (HCC)    left sided weakness   Type 2 diabetes mellitus (HCC)    Vitamin D deficiency     Past Surgical History:  Procedure Laterality Date   BACK SURGERY     BIOPSY N/A 11/24/2014   Procedure: BIOPSY;  Surgeon: West Bali, MD;  Location: [facility];  Service: Endoscopy;  Laterality: N/A;   BIOPSY  05/17/2016   Procedure: BIOPSY;  Surgeon: West Bali, MD;  [facility];  Service: Endoscopy;;  gastric biopsy   COLONOSCOPY WITH PROPOFOL N/A 11/24/2014   Dr. Konrad Saha polyps removed/moderate sized internal hemorrhoids, tubular adenomas. Next surveillance in 3 years   ESOPHAGEAL DILATION N/A 07/23/2021   Procedure: ESOPHAGEAL DILATION;  Surgeon: Lanelle Bal, DO;  [facility];  Service: Endoscopy;  Laterality: N/A;   ESOPHAGOGASTRODUODENOSCOPY (EGD) WITH PROPOFOL N/A 11/24/2014   Dr. Jennell Corner  HH/patent stricture at the gastroesophageal junction, mild non-erosive gastritis, path negative for H.pylori or celiac sprue   ESOPHAGOGASTRODUODENOSCOPY (EGD) WITH PROPOFOL N/A 05/17/2016   Procedure: ESOPHAGOGASTRODUODENOSCOPY (EGD) WITH PROPOFOL;  Surgeon: West Bali, MD;  [facility];  Service: Endoscopy;  Laterality: N/A;  12:45pm   ESOPHAGOGASTRODUODENOSCOPY (EGD) WITH PROPOFOL N/A 07/23/2021   Procedure: ESOPHAGOGASTRODUODENOSCOPY (EGD) WITH PROPOFOL;  Surgeon: Lanelle Bal, DO;  [facility];  Service: Endoscopy;  Laterality: N/A;   GIVENS CAPSULE STUDY N/A 12/11/2014   MULTILPLE EROSION IN the stomach WITH ACTIVE OOZING. OCCASIONAL EROSIONS AND RARE ULCER SEEN IN PROXIMAL SMALL BOWEL . No masses or AVMs SEEN. NO OLD BLOOD OR FRESH BLOOD SEEN.    POLYPECTOMY N/A 11/24/2014   Procedure: POLYPECTOMY;  Surgeon: West Bali, MD;  Location: [facility];  Service: Endoscopy;  Laterality: N/A;   SAVORY DILATION N/A 05/17/2016   Procedure: SAVORY DILATION;  Surgeon: West Bali, MD;  [facility];  Service: Endoscopy;  Laterality: N/A;    Family History  Problem Relation Age of Onset   Hypertension Mother    Colon cancer Neg Hx     Social History:  reports that she quit smoking about 26 years ago. Her smoking use included cigarettes. She has a 5.00 pack-year smoking history. She has never used smokeless tobacco. She reports that she does not drink alcohol and  does not use drugs.  Allergies:  Allergies  Allergen Reactions   Sulfa Antibiotics Rash    Medications: {medication reviewed/display:3041432}  Results for orders placed or performed during the hospital encounter of 11/09/21 (from the past 48 hour(s))  CBC with Differential/Platelet     Status: Abnormal   Collection Time: 11/09/21  5:37 PM  Result Value Ref Range   WBC 10.6 (H) 4.0 - 10.5 K/uL   RBC 4.09 3.87 - 5.11 MIL/uL   Hemoglobin 11.7 (L) 12.0 - 15.0 g/dL   HCT 16.1 (L) 09.6 - 04.5  %   MCV 85.8 80.0 - 100.0 fL   MCH 28.6 26.0 - 34.0 pg   MCHC 33.3 30.0 - 36.0 g/dL   RDW 40.9 81.1 - 91.4 %   Platelets 172 150 - 400 K/uL   nRBC 0.0 0.0 - 0.2 %   Neutrophils Relative % 86 %   Neutro Abs 9.0 (H) 1.7 - 7.7 K/uL   Lymphocytes Relative 8 %   Lymphs Abs 0.9 0.7 - 4.0 K/uL   Monocytes Relative 6 %   Monocytes Absolute 0.7 0.1 - 1.0 K/uL   Eosinophils Relative 0 %   Eosinophils Absolute 0.0 0.0 - 0.5 K/uL   Basophils Relative 0 %   Basophils Absolute 0.0 0.0 - 0.1 K/uL   Immature Granulocytes 0 %   Abs Immature Granulocytes 0.02 0.00 - 0.07 K/uL    Comment: Performed at [facility], [address], [place], [place] [address]    CT Head Wo Contrast  Result Date: 11/09/2021 CLINICAL DATA:  Head trauma EXAM: CT HEAD WITHOUT CONTRAST TECHNIQUE: Contiguous axial images were obtained from the base of the skull through the vertex without intravenous contrast. RADIATION DOSE REDUCTION: This exam was performed according to the departmental dose-optimization program which includes automated exposure control, adjustment of the mA and/or kV according to patient size and/or use of iterative reconstruction technique. COMPARISON:  CT brain 10/10/2021, 09/21/2021 FINDINGS: Brain: No acute territorial infarction, hemorrhage, or intracranial mass. The ventricles are nonenlarged. Advanced chronic small vessel ischemic changes of the white matter. Vascular: No hyperdense vessels.  Carotid vascular calcification Skull: Normal. Negative for fracture or focal lesion. Sinuses/Orbits: Postsurgical changes of the maxillary and ethmoid sinuses. Other: Chronic anterior subluxation of the mandibular heads. IMPRESSION: 1. No CT evidence for acute intracranial abnormality. 2. Advanced chronic small vessel ischemic changes of the white matter. Electronically Signed   By: Jasmine Pang M.D.   On: 11/09/2021 19:24   DG Chest Port 1 View  Result Date: 11/09/2021 CLINICAL DATA:  Fall pain in left hip EXAM:  PORTABLE CHEST 1 VIEW COMPARISON:  10/10/2021 FINDINGS: Right CP angle is non included. No sizable effusion. Mild cardiomegaly. Mild diffuse chronic interstitial opacity. Aortic atherosclerosis. Suspect bilateral skin fold artifacts over pneumothoraces. IMPRESSION: 1. Cardiomegaly. 2. Bilateral skin fold artifacts are favored over pneumothoraces. Electronically Signed   By: Jasmine Pang M.D.   On: 11/09/2021 18:45   DG Hip Unilat W or Wo Pelvis 2-3 Views Left  Result Date: 11/09/2021 CLINICAL DATA:  Fall.  Left hip pain EXAM: DG HIP (WITH OR WITHOUT PELVIS) 2-3V LEFT COMPARISON:  03/09/2021 FINDINGS: Diffuse osseous demineralization. Acute fracture of the left femoral neck with mild superolateral displacement and varus angulation. No dislocation. Pelvic bony ring appears intact. IMPRESSION: Acute fracture of the left femoral neck. Electronically Signed   By: Duanne Guess D.O.   On: 11/09/2021 18:42    Review of Systems Blood pressure 130/74, pulse (!) 110,  resp. rate (!) 28, height 5\' 5"  (1.651 m), weight 54 kg, SpO2 98 %. Physical Exam  Assessment/Plan: ***  Fuller Canada 11/09/2021, 7:58 PM

## 2021-11-09 NOTE — ED Notes (Signed)
Lab advised PTT needed to be redrawn.

## 2021-11-09 NOTE — H&P (View-Only) (Signed)
Reason for Consult: Left hip fracture Referring Physician: Dr. Berle Mull  [name] is an 76 y.o. female.  HPI: 76 year old female presents with left hip pain.  She is on Eliquis for prior stroke.  The patient says she was transferring from bed to wheelchair.  She says her foot slipped on the floor and she fell and fractured her left hip.  She was brought to the hospital by EMS she complained of severe nonradiating pain over the left hip associated with inability to weight-bear and increased pain with movement  Date of injury was November 09, 2021  She has a history of atrial fibrillation congestive heart failure collagen vascular disease she is wheelchair dependent except for pivots and transfers she has had a history of DVT in the past she has hypertension some left-sided hemiplegia from a stroke there is a history of schizophrenia and type 2 diabetes along with vitamin D deficiency  Past Medical History:  Diagnosis Date   Anxiety    Arthritis    Atrial fibrillation (HCC)    Atrial flutter (HCC)    CHF (congestive heart failure) ([place])    Collagen vascular disease ([place])    COVID-19 virus infection    COVID-19+ approx 01/24/19; asymptomatic course with full recovery   Dependence on wheelchair    pivot/transfers   Depression    History of psychosis and previous suicide attempt   DVT, lower extremity, recurrent ([place])    Long-term Coumadin per Dr. Legrand Rams   Essential hypertension    GERD (gastroesophageal reflux disease)    Hemiplegia ([place]) 2010   Left side   History of stroke    Acute infarct and right cerebral white matter small vessel disease 12/10   Hyperlipidemia    Hypotension, unspecified    IDA (iron deficiency anemia)    Irritable bowel syndrome without diarrhea    Leg DVT (deep venous thromboembolism), acute ([place]) 2006   Obstructive hypertrophic cardiomyopathy ([place])    Orthostatic hypotension    Schizophrenia ([place])    Stroke ([place])    left sided weakness   Type 2  diabetes mellitus ([place])    Vitamin D deficiency     Past Surgical History:  Procedure Laterality Date   BACK SURGERY     BIOPSY N/A 11/24/2014   Procedure: BIOPSY;  Surgeon: Danie Binder, MD;  Location: [facility];  Service: Endoscopy;  Laterality: N/A;   BIOPSY  05/17/2016   Procedure: BIOPSY;  Surgeon: Danie Binder, MD;  Location: [facility];  Service: Endoscopy;;  gastric biopsy   COLONOSCOPY WITH PROPOFOL N/A 11/24/2014   Dr. Rudie Meyer polyps removed/moderate sized internal hemorrhoids, tubular adenomas. Next surveillance in 3 years   ESOPHAGEAL DILATION N/A 07/23/2021   Procedure: ESOPHAGEAL DILATION;  Surgeon: Eloise Harman, DO;  Location: [facility];  Service: Endoscopy;  Laterality: N/A;   ESOPHAGOGASTRODUODENOSCOPY (EGD) WITH PROPOFOL N/A 11/24/2014   Dr. Clayburn Pert HH/patent stricture at the gastroesophageal junction, mild non-erosive gastritis, path negative for H.pylori or celiac sprue   ESOPHAGOGASTRODUODENOSCOPY (EGD) WITH PROPOFOL N/A 05/17/2016   Procedure: ESOPHAGOGASTRODUODENOSCOPY (EGD) WITH PROPOFOL;  Surgeon: Danie Binder, MD;  [facility];  Service: Endoscopy;  Laterality: N/A;  12:45pm   ESOPHAGOGASTRODUODENOSCOPY (EGD) WITH PROPOFOL N/A 07/23/2021   Procedure: ESOPHAGOGASTRODUODENOSCOPY (EGD) WITH PROPOFOL;  Surgeon: Eloise Harman, DO;  [facility];  Service: Endoscopy;  Laterality: N/A;   GIVENS CAPSULE STUDY N/A 12/11/2014   MULTILPLE EROSION IN the stomach WITH ACTIVE OOZING. OCCASIONAL  EROSIONS AND RARE ULCER SEEN IN PROXIMAL SMALL BOWEL . No masses or AVMs SEEN. NO OLD BLOOD OR FRESH BLOOD SEEN.    POLYPECTOMY N/A 11/24/2014   Procedure: POLYPECTOMY;  Surgeon: Danie Binder, MD;  Location: [facility];  Service: Endoscopy;  Laterality: N/A;   SAVORY DILATION N/A 05/17/2016   Procedure: SAVORY DILATION;  Surgeon: Danie Binder, MD;  [facility];  Service: Endoscopy;  Laterality: N/A;    Family History  Problem Relation  Age of Onset   Hypertension Mother    Colon cancer Neg Hx     Social History:  reports that she quit smoking about 26 years ago. Her smoking use included cigarettes. She has a 5.00 pack-year smoking history. She has never used smokeless tobacco. She reports that she does not drink alcohol and does not use drugs.  Allergies:  Allergies  Allergen Reactions   Sulfa Antibiotics Rash    Medications:  Current Outpatient Medications  Medication Instructions   Abilify 2 mg, Oral, Daily   acetaminophen (TYLENOL) 650 mg, Oral, 3 times daily PRN   albuterol (VENTOLIN HFA) 108 (90 Base) MCG/ACT inhaler 2 puffs, Inhalation, Every 6 hours PRN   amiodarone (PACERONE) 200 mg, Oral, Daily   apixaban (ELIQUIS) 5 mg, Oral, 2 times daily   atorvastatin (LIPITOR) 40 MG tablet 1 tablet, Oral, Daily at bedtime   busPIRone (BUSPAR) 7.5 mg, Oral, 2 times daily   calcium carbonate (TUMS - DOSED IN MG ELEMENTAL CALCIUM) 500 MG chewable tablet 1 tablet, Oral, 4 times daily PRN   cholecalciferol (VITAMIN D3) 2,000 Units, Oral, Daily   clonazePAM (KLONOPIN) 0.5 mg, Oral, 3 times daily PRN   DULoxetine (CYMBALTA) 60 MG capsule 1 capsule, Oral, Daily   HYDROcodone-acetaminophen (NORCO) 10-325 MG tablet 1 tablet, Oral, Every 8 hours PRN   insulin detemir (LEVEMIR FLEXTOUCH) 100 UNIT/ML FlexPen INJECT 22 UNITS SUBCUTANEOUSLY AT BEDTIME.(HOLD IF BS<70: CALL MD IF BS> 400)   latanoprost (XALATAN) 0.005 % ophthalmic solution 1 drop, Both Eyes, Daily at bedtime   lubiprostone (AMITIZA) 8 MCG capsule 1 capsule, Oral, Daily   metoprolol tartrate (LOPRESSOR) 12.5 mg, Oral, 2 times daily   midodrine (PROAMATINE) 10 mg, Oral, 3 times daily   mirtazapine (REMERON) 30 mg, Oral, Daily at bedtime   Multiple Vitamin (MULTIVITAMIN) tablet 1 tablet, Oral, Daily   naloxegol oxalate (MOVANTIK) 12.5 mg, Oral, Daily   NOVOLOG FLEXPEN 100 UNIT/ML FlexPen INJECT SUBCUTANEOUSLY AS FOLLOWS WITH MEALS: 90-150=10u: 151-200=11u:  201-250=12u: 251-300=13u: 301-350=14u: 351-400=15u: BS>400=16u & CALL MD.   ondansetron (ZOFRAN) 4 mg, Oral, 2 times daily   oxybutynin (DITROPAN-XL) 5 mg, Oral, Daily   pantoprazole (PROTONIX) 40 mg, Oral, 2 times daily before meals   polyethylene glycol (MIRALAX / GLYCOLAX) 17 g, Oral, Daily   risperiDONE (RISPERDAL) 2 mg, Oral, Daily at bedtime, Take '2mg'$  at bedtime and '1mg'$  at 9am and 5p daily   risperiDONE (RISPERDAL) 1 mg, Oral, 2 times daily, Take one tablet at 900am and 1 tablet at 5pm and '2mg'$  at bedtime   senna (SENOKOT) 8.6 MG tablet 1 tablet, Oral, Daily   Skin Protectants, Misc. (BAZA PROTECT EX) 1 Application, Topical, 2 times daily   TRADJENTA 5 MG TABS tablet TAKE (1) TABLET BY MOUTH ONCE DAILY.     Results for orders placed or performed during the hospital encounter of 11/09/21 (from the past 48 hour(s))  CBC with Differential/Platelet     Status: Abnormal   Collection Time: 11/09/21  5:37 PM  Result  Value Ref Range   WBC 10.6 (H) 4.0 - 10.5 K/uL   RBC 4.09 3.87 - 5.11 MIL/uL   Hemoglobin 11.7 (L) 12.0 - 15.0 g/dL   HCT 35.1 (L) 36.0 - 46.0 %   MCV 85.8 80.0 - 100.0 fL   MCH 28.6 26.0 - 34.0 pg   MCHC 33.3 30.0 - 36.0 g/dL   RDW 15.2 11.5 - 15.5 %   Platelets 172 150 - 400 K/uL   nRBC 0.0 0.0 - 0.2 %   Neutrophils Relative % 86 %   Neutro Abs 9.0 (H) 1.7 - 7.7 K/uL   Lymphocytes Relative 8 %   Lymphs Abs 0.9 0.7 - 4.0 K/uL   Monocytes Relative 6 %   Monocytes Absolute 0.7 0.1 - 1.0 K/uL   Eosinophils Relative 0 %   Eosinophils Absolute 0.0 0.0 - 0.5 K/uL   Basophils Relative 0 %   Basophils Absolute 0.0 0.0 - 0.1 K/uL   Immature Granulocytes 0 %   Abs Immature Granulocytes 0.02 0.00 - 0.07 K/uL    Comment: Performed at [facility], [address], [place], [place] [address]    CT Head Wo Contrast  Result Date: 11/09/2021 CLINICAL DATA:  Head trauma EXAM: CT HEAD WITHOUT CONTRAST TECHNIQUE: Contiguous axial images were obtained from the base of the skull  through the vertex without intravenous contrast. RADIATION DOSE REDUCTION: This exam was performed according to the departmental dose-optimization program which includes automated exposure control, adjustment of the mA and/or kV according to patient size and/or use of iterative reconstruction technique. COMPARISON:  CT brain 10/10/2021, 09/21/2021 FINDINGS: Brain: No acute territorial infarction, hemorrhage, or intracranial mass. The ventricles are nonenlarged. Advanced chronic small vessel ischemic changes of the white matter. Vascular: No hyperdense vessels.  Carotid vascular calcification Skull: Normal. Negative for fracture or focal lesion. Sinuses/Orbits: Postsurgical changes of the maxillary and ethmoid sinuses. Other: Chronic anterior subluxation of the mandibular heads. IMPRESSION: 1. No CT evidence for acute intracranial abnormality. 2. Advanced chronic small vessel ischemic changes of the white matter. Electronically Signed   By: Donavan Foil M.D.   On: 11/09/2021 19:24   DG Chest Port 1 View  Result Date: 11/09/2021 CLINICAL DATA:  Fall pain in left hip EXAM: PORTABLE CHEST 1 VIEW COMPARISON:  10/10/2021 FINDINGS: Right CP angle is non included. No sizable effusion. Mild cardiomegaly. Mild diffuse chronic interstitial opacity. Aortic atherosclerosis. Suspect bilateral skin fold artifacts over pneumothoraces. IMPRESSION: 1. Cardiomegaly. 2. Bilateral skin fold artifacts are favored over pneumothoraces. Electronically Signed   By: Donavan Foil M.D.   On: 11/09/2021 18:45   DG Hip Unilat W or Wo Pelvis 2-3 Views Left  My interpretation of the imaging the patient has a displaced left femoral neck fracture with 100% displacement  Result Date: 11/09/2021 CLINICAL DATA:  Fall.  Left hip pain EXAM: DG HIP (WITH OR WITHOUT PELVIS) 2-3V LEFT COMPARISON:  03/09/2021 FINDINGS: Diffuse osseous demineralization. Acute fracture of the left femoral neck with mild superolateral displacement and varus  angulation. No dislocation. Pelvic bony ring appears intact. IMPRESSION: Acute fracture of the left femoral neck. Electronically Signed   By: Davina Poke D.O.   On: 11/09/2021 18:42    Review of Systems  Psychiatric/Behavioral:  The patient is nervous/anxious.   All other systems reviewed and are negative.  Blood pressure 130/74, pulse (!) 110, resp. rate (!) 28, height '5\' 5"'$  (1.651 m), weight 54 kg, SpO2 98 %. Physical Exam  General appearance the patient's body habitus is  noted to be of small frame.  She is lying on her side with her legs flexed and probably has some flexion contractures  She is oriented to person and place but not time  Her mood is pleasant her affect is flat  She cannot ambulate  The right and left upper extremity show no deformities and no tenderness to palpation range of motion no obvious contractures.  Stability no joint dislocations or subluxation.  Muscle tone normal without tremor skin normal  Again both lower extremities are flexed at the knees and hips.  The feet appear to be plantigrade.  There is tenderness to palpation and a bruise over the left hip none on the right range of motion tests were deferred because of position no joints were dislocated muscle tone normal without tremor and skin as described.  Right side leg skin normal  Cardiovascular exam shows normal pulses  Lymph nodes in the region of the groin is normal.  She appears to have normal sensation and response to palpation with withdrawal on palpation of the left hip.  Deep tendon reflexes show no asymmetry and there are no pathologic reflexes balance and coordination were deferred in the lower extremities because of the fracture  Assessment/Plan:  76 year old female wheelchair dependent acute fracture left hip on Eliquis.  The patient will require surgical intervention.  Standard risks are in play as well as increased risk because of the Eliquis and the heart condition.  The Eliquis will  be stopped and we will delay surgery until Friday at which time we will perform bipolar placement of the left hip.     Arther Abbott 11/09/2021, 7:58 PM

## 2021-11-10 ENCOUNTER — Inpatient Hospital Stay (HOSPITAL_COMMUNITY): Payer: Medicare Other

## 2021-11-10 DIAGNOSIS — S72002A Fracture of unspecified part of neck of left femur, initial encounter for closed fracture: Secondary | ICD-10-CM | POA: Diagnosis not present

## 2021-11-10 LAB — COMPREHENSIVE METABOLIC PANEL
ALT: 10 U/L (ref 0–44)
AST: 15 U/L (ref 15–41)
Albumin: 2.8 g/dL — ABNORMAL LOW (ref 3.5–5.0)
Alkaline Phosphatase: 81 U/L (ref 38–126)
Anion gap: 5 (ref 5–15)
BUN: 11 mg/dL (ref 8–23)
CO2: 26 mmol/L (ref 22–32)
Calcium: 7.9 mg/dL — ABNORMAL LOW (ref 8.9–10.3)
Chloride: 105 mmol/L (ref 98–111)
Creatinine, Ser: 0.61 mg/dL (ref 0.44–1.00)
GFR, Estimated: >60 mL/min (ref >60)
Glucose, Bld: 190 mg/dL — ABNORMAL HIGH (ref 70–99)
Potassium: 3.9 mmol/L (ref 3.5–5.1)
Sodium: 136 mmol/L (ref 135–145)
Total Bilirubin: 0.6 mg/dL (ref 0.3–1.2)
Total Protein: 5.8 g/dL — ABNORMAL LOW (ref 6.5–8.1)

## 2021-11-10 LAB — CBC
HCT: 30.8 % — ABNORMAL LOW (ref 36.0–46.0)
Hemoglobin: 10.1 g/dL — ABNORMAL LOW (ref 12.0–15.0)
MCH: 28.2 pg (ref 26.0–34.0)
MCHC: 32.8 g/dL (ref 30.0–36.0)
MCV: 86 fL (ref 80.0–100.0)
Platelets: 186 10*3/uL (ref 150–400)
RBC: 3.58 MIL/uL — ABNORMAL LOW (ref 3.87–5.11)
RDW: 15.1 % (ref 11.5–15.5)
WBC: 10.1 10*3/uL (ref 4.0–10.5)
nRBC: 0 % (ref 0.0–0.2)

## 2021-11-10 LAB — URINALYSIS, ROUTINE W REFLEX MICROSCOPIC
Bacteria, UA: NONE SEEN
Bilirubin Urine: NEGATIVE
Glucose, UA: NEGATIVE mg/dL
Hgb urine dipstick: NEGATIVE
Ketones, ur: NEGATIVE mg/dL
Nitrite: NEGATIVE
Protein, ur: NEGATIVE mg/dL
Specific Gravity, Urine: 1.012 (ref 1.005–1.030)
pH: 6 (ref 5.0–8.0)

## 2021-11-10 LAB — GLUCOSE, CAPILLARY
Glucose-Capillary: 178 mg/dL — ABNORMAL HIGH (ref 70–99)
Glucose-Capillary: 187 mg/dL — ABNORMAL HIGH (ref 70–99)
Glucose-Capillary: 159 mg/dL — ABNORMAL HIGH (ref 70–99)
Glucose-Capillary: 217 mg/dL — ABNORMAL HIGH (ref 70–99)

## 2021-11-10 LAB — PHOSPHORUS: Phosphorus: 3.6 mg/dL (ref 2.5–4.6)

## 2021-11-10 LAB — HEMOGLOBIN A1C
Hgb A1c MFr Bld: 6.6 % — ABNORMAL HIGH (ref 4.8–5.6)
Mean Plasma Glucose: 142.72 mg/dL

## 2021-11-10 LAB — MAGNESIUM: Magnesium: 1.6 mg/dL — ABNORMAL LOW (ref 1.7–2.4)

## 2021-11-10 LAB — MRSA NEXT GEN BY PCR, NASAL: MRSA by PCR Next Gen: NOT DETECTED

## 2021-11-10 MED ORDER — POTASSIUM CHLORIDE CRYS ER 20 MEQ PO TBCR
40.0000 meq | EXTENDED_RELEASE_TABLET | Freq: Once | ORAL | Status: AC
  Administered 2021-11-10: 40 meq via ORAL
  Filled 2021-11-10: qty 2

## 2021-11-10 MED ORDER — ONDANSETRON HCL 4 MG/2ML IJ SOLN
4.0000 mg | Freq: Four times a day (QID) | INTRAMUSCULAR | Status: DC | PRN
  Administered 2021-11-10 – 2021-11-12 (×2): 4 mg via INTRAVENOUS
  Filled 2021-11-10 (×2): qty 2

## 2021-11-10 MED ORDER — ENSURE ENLIVE PO LIQD
237.0000 mL | Freq: Two times a day (BID) | ORAL | Status: DC
  Administered 2021-11-10 – 2021-11-16 (×11): 237 mL via ORAL

## 2021-11-10 MED ORDER — PROSOURCE PLUS PO LIQD
30.0000 mL | Freq: Two times a day (BID) | ORAL | Status: DC
  Administered 2021-11-10 – 2021-11-16 (×11): 30 mL via ORAL
  Filled 2021-11-10 (×12): qty 30

## 2021-11-10 MED ORDER — HYDROMORPHONE HCL 1 MG/ML IJ SOLN
0.5000 mg | INTRAMUSCULAR | Status: DC | PRN
  Administered 2021-11-10 – 2021-11-15 (×11): 0.5 mg via INTRAVENOUS
  Filled 2021-11-10 (×12): qty 0.5

## 2021-11-10 MED ORDER — HYDROCODONE-ACETAMINOPHEN 5-325 MG PO TABS
1.0000 | ORAL_TABLET | Freq: Four times a day (QID) | ORAL | Status: DC | PRN
  Administered 2021-11-10 – 2021-11-12 (×5): 1 via ORAL
  Filled 2021-11-10 (×6): qty 1

## 2021-11-10 MED ORDER — MIDODRINE HCL 5 MG PO TABS
10.0000 mg | ORAL_TABLET | Freq: Three times a day (TID) | ORAL | Status: DC
  Administered 2021-11-10 – 2021-11-16 (×19): 10 mg via ORAL
  Filled 2021-11-10 (×21): qty 2

## 2021-11-10 MED ORDER — SODIUM CHLORIDE 0.9 % IV BOLUS
250.0000 mL | Freq: Once | INTRAVENOUS | Status: AC
  Administered 2021-11-10: 250 mL via INTRAVENOUS

## 2021-11-10 MED ORDER — ADULT MULTIVITAMIN W/MINERALS CH
1.0000 | ORAL_TABLET | Freq: Every day | ORAL | Status: DC
  Administered 2021-11-10 – 2021-11-16 (×6): 1 via ORAL
  Filled 2021-11-10 (×7): qty 1

## 2021-11-10 NOTE — Progress Notes (Signed)
RN called due to patient's BP at 80/52, it was noted the patient received metoprolol last night.  IV NS bolus 250 mL was given, patient's home midodrine was started.  Metoprolol will be held at this time.  Continue to monitor

## 2021-11-10 NOTE — TOC Initial Note (Signed)
Transition of Care [facility]) - Initial/Assessment Note    Patient Details  Name: [name] MRN: [id number] Date of Birth: 28-May-1945  [facility] of [facility]) CM/SW Contact:    [name], LCSW Phone Number: 11/10/2021, 1:59 PM  Clinical Narrative:                 [facility]. Admitted for fracture of femoral neck, left closed. Has been at [facility] since August. Previously a resident at [facility]. Will likely remain LTC at [facility].  [name], legal guardian, and Irven Shelling, facility admissions are agreeable to patient return at d/c.    Expected Discharge Plan: Skilled Nursing Facility Barriers to Discharge: Continued Medical Work up   Patient Goals and CMS Choice Patient states their goals for this hospitalization and ongoing recovery are:: return to facility      Expected Discharge Plan and Services Expected Discharge Plan: [place] Acute Care Choice: [place] Living arrangements for the past 2 months: [place]                                      Prior Living Arrangements/Services Living arrangements for the past 2 months: [place] Lives with:: Facility Resident   Do you feel safe going back to the place where you live?: Yes   n/a           Activities of Daily Living Home Assistive Devices/Equipment: Other (Comment) (unsure what patient has at nursing facility) ADL Screening (condition at time of admission) Patient's cognitive ability adequate to safely complete daily activities?: Yes Is the patient deaf or have difficulty hearing?: Yes Does the patient have difficulty seeing, even when wearing glasses/contacts?: No Does the patient have difficulty concentrating, remembering, or making decisions?: No Patient able to express need for assistance with ADLs?: Yes Does the patient have difficulty dressing or bathing?: Yes Independently  performs ADLs?: No Communication: Independent Dressing (OT): Needs assistance Is this a change from baseline?: Pre-admission baseline Grooming: Needs assistance Is this a change from baseline?: Pre-admission baseline Feeding: Independent Bathing: Needs assistance Is this a change from baseline?: Pre-admission baseline Toileting: Needs assistance Is this a change from baseline?: Pre-admission baseline In/Out Bed: Needs assistance Is this a change from baseline?: Pre-admission baseline Walks in Home: Needs assistance Is this a change from baseline?: Pre-admission baseline Does the patient have difficulty walking or climbing stairs?: Yes Weakness of Legs: None Weakness of Arms/Hands: None  Permission Sought/Granted Permission sought to share information with : Guardian    Share Information with NAME: [name], legal guardian           Emotional Assessment              Admission diagnosis:  Fracture of femoral neck, left, closed ([place]) [S72.002A] Closed fracture of left hip, initial encounter [facility]) [S72.002A] Patient Active Problem List   Diagnosis Date Noted   Fracture of femoral neck, left, closed ([place]) 11/09/2021   Chronic diastolic CHF (congestive heart failure) ([place]) 11/09/2021   Calculus of gallbladder without cholecystitis without obstruction    Septic Shock 10/10/2021   DNR (do not resuscitate) 10/10/2021   Long term current use of anticoagulant therapy 10/10/2021   Lactic acidosis 10/10/2021   Failure to thrive in adult 10/10/2021   HOCM (hypertrophic obstructive cardiomyopathy) ([place]) 09/19/2021   Pressure injury of  skin 09/16/2021   Goals of care, counseling/discussion 09/16/2021   UTI (urinary tract infection) 09/14/2021   Septic shock ([place]) 09/13/2021   Pneumonia 08/14/2021   PNA (pneumonia) 08/13/2021   Opioid dependence ([place]) 07/24/2021   Prolonged QT interval 07/22/2021   Depression 05/18/2021   Fecal impaction ([place]) 04/23/2021   Paroxysmal  atrial fibrillation ([place]) 03/22/2021   SBO (small bowel obstruction) ([place]) 04/18/2020   Atrial fibrillation with RVR ([place]) 10/04/2019   GERD (gastroesophageal reflux disease) 08/26/2019   Constipation-opioid induced 09/09/2018   Hypotensive episode 03/21/2018   Primary osteoarthritis of right hip 04/18/2017   History of cerebrovascular accident (CVA) with residual deficit 03/28/2017   Schizophrenia ([place]) 03/28/2017   Generalized weakness 03/28/2017   IBS (irritable bowel syndrome) 10/16/2016   Left hemiparesis ([place]) 10/06/2016   Chronic superficial gastritis without bleeding    Stricture and stenosis of esophagus    IDA (iron deficiency anemia) 04/06/2015   Mixed hyperlipidemia 11/06/2014   Sedentary lifestyle 11/06/2014   Uncontrolled type 2 diabetes mellitus with hyperglycemia, with long-term current use of insulin ([place]) 03/24/2010   Essential hypertension 03/24/2010   DVT, lower extremity, recurrent ([place]) 03/24/2010   PCP:  Moss Mc, NP Pharmacy:   [facility], [place] [address] Phone: [phone] Fax: [phone]     Social Determinants of Health (SDOH) Interventions    Readmission Risk Interventions    10/11/2021   11:15 AM 09/16/2021    2:02 PM 03/29/2021   10:51 AM  Readmission Risk Prevention Plan  Transportation Screening Complete Complete Complete  Medication Review Press photographer) Complete Complete   HRI or Home Care Consult Complete Complete Complete  SW Recovery Care/Counseling Consult Complete  Complete  Palliative Care Screening Not Applicable Not Applicable Not Applicable  Skilled Nursing Facility Complete Not Applicable Complete

## 2021-11-10 NOTE — Progress Notes (Signed)
Patient was brought to the floor via stretcher. She was a two person assist to move to the bed. Patient unable to help with transfer. She is alert and oriented. Complains of pain in her left leg and stated that she cannot move her leg. Admission assessment completed. Vital signs stable at this time.

## 2021-11-10 NOTE — Progress Notes (Signed)
Nurse tech called me into the room regarding a low blood pressure. I took a manual blood pressure and it was 80/52. Contacted MD and new ordered received. Patient remains alert and able to respond to my questions.

## 2021-11-10 NOTE — Progress Notes (Signed)
Progress Note Patient: [name]:[id number] DOB: Nov 28, 1945 DOA: 11/09/2021  DOS: the patient was seen and examined on 11/10/2021  Brief hospital course: PMH of HTN, DM, chronic A-fib on Eliquis, schizophrenia.  Present to the hospital with complaints of a mechanical fall.  Found to have left femoral neck fracture.  Orthopedic consulted.  Patient scheduled for surgery tomorrow.  Cardiology consult for preop clearance due to history of HOCM.  Patient reports abdominal pain. Assessment and Plan: Closed left femoral neck fracture Left hip x-ray showed acute fracture of the left femoral neck Continue IV Dilaudid 0.5 mg every 4 hours as needed for moderate/severe pain Continue fall precaution and neurocheck Orthopedic surgeon Dr. Aline Brochure will consider surgery on 10/20. Consider PT/OT eval and treat status post surgical repair   Chronic atrial fibrillation with RVR Continue amiodarone Eliquis will be held at this time in anticipation for surgical repair on Friday (10/20)   Prolonged QT interval QTc 569m Avoid QT prolonging drugs Magnesium level will be checked Continue telemetry   T2DM with hyperglycemia Continue Semglee 10 units nightly and adjust dose accordingly Continue ISS and hypoglycemia protocol   GERD Continue Protonix   Chronic diastolic CHF HOCM Patient appears euvolemic clinically Echocardiogram done in August of this year showed LVEF of 65 to 70%, no RWMA, +HOCM Continue total input/output, daily weights and fluid restriction Cardiology was consulted for preop clearance.   Stricture and stenosis of esophagus EGD done on 07/23/2021 showed  food in the middle third and lower third of the esophagus.   A distal esophageal stricture was noted. Barium swallow done on 07/25/2021 showed nonspecific esophageal dysmotility with omission of primary peristaltic waves in the mid esophagus, and tertiary contractions in the esophagus Continue Protonix Continue dysphagia 1  diet   Essential hypertension Continue metoprolol   Mixed hyperlipidemia Continue Lipitor   Schizophrenia Continue Risperdal   Depression Continue Cymbalta  Subjective: Reports allover pain but primarily on her left hip.  Also reports pain in her abdomen.  No nausea no vomiting.  No fever no chills.  Physical Exam: Vitals:   11/10/21 0543 11/10/21 0627 11/10/21 0740 11/10/21 1421  BP: 119/64  116/76 (!) 140/86  Pulse: (!) 109   85  Resp: 18   20  Temp: 98 F (36.7 C)   97.8 F (36.6 C)  TempSrc:    Oral  SpO2: 98%   99%  Weight:  58.1 kg    Height:       General: Appear in mild distress; no visible Abnormal Neck Mass Or lumps, Conjunctiva normal Cardiovascular: S1 and S2 Present, aortic systolic  Murmur, Respiratory: good respiratory effort, Bilateral Air entry present and CTA, no Crackles, no wheezes Abdomen: Bowel Sound present, mild diffuse tenderness Extremities: no Pedal edema Neurology: alert and oriented to time, place, and person  Gait not checked due to patient safety concerns   Data Reviewed: I have Reviewed nursing notes, Vitals, and Lab results since pt's last encounter. Pertinent lab results CBC and BMP I have ordered test including CBC and BMP I have ordered imaging studies x-ray abdomen.  I discussed patient's case with cardiology for consult.  Family Communication: No one at bedside  Disposition: Status is: Inpatient Remains inpatient appropriate because: Need for surgical correction of femoral neck fracture and postoperative recovery monitoring.  SCDs Start: 11/09/21 2113   Level of care: Telemetry Telemetry reviewed, shows NSR sinus tachycardia, prolonged QTc Author: PBerle Mull MD 11/10/2021 6:10 PM Please look on [email or web address] to find out  who is on call.

## 2021-11-10 NOTE — Progress Notes (Signed)
Initial Nutrition Assessment  DOCUMENTATION CODES:      INTERVENTION:  Ensure Enlive po BID, (vanilla)  ProSource Plus BID   Assist with feeding as needed  NUTRITION DIAGNOSIS:   Increased nutrient needs related to wound healing (hip fracture and pending surgery tomorrow) as evidenced by estimated needs.   GOAL:  Patient will meet greater than or equal to 90% of their needs   MONITOR:  PO intake, Supplement acceptance, Labs, Skin, Weight trends  REASON FOR ASSESSMENT:   Malnutrition Screening Tool    ASSESSMENT: Patient is a 76 yo female with hx of DM2, HTN, GERD, schizophrenia who presents from [facility] after falling. Closed fracture of left femoral neck. Surgery planned for Friday.   Daughter is bedside. Patient tray arrived during RD visit. Purred foods which is what patient has been eating at facility. Patient is edentulous- she has dentures but they aren't with her.  Patient intake described by daughter as "she eats like a bird". Tray set up provided by RD and patient says, my daughter is going to feed me.   Patient drinks vanilla Ensure or Boost. Will add these given her poor intake and upcoming surgery. Weights reviewed- per chart pt weight is down 10.5 kg (16%) the past 5 months.  Suspect patient is malnourished given her diet hx and significant wt loss. Unable to diagnose at this time.  Medications reviewed and include:  novolog, semglee, protonix, lipitor, risperdal.   IVF- NS@ 100 ml/hr     Latest Ref Rng & Units 11/10/2021    4:26 AM 11/09/2021    8:06 PM 10/13/2021    5:57 AM  BMP  Glucose 70 - 99 mg/dL 190  181  184   BUN 8 - 23 mg/dL '11  12  7   '$ Creatinine 0.44 - 1.00 mg/dL 0.61  0.63  0.58   Sodium 135 - 145 mmol/L 136  137  136   Potassium 3.5 - 5.1 mmol/L 3.9  3.8  3.6   Chloride 98 - 111 mmol/L 105  104  105   CO2 22 - 32 mmol/L '26  27  27   '$ Calcium 8.9 - 10.3 mg/dL 7.9  8.3  8.0      NUTRITION - FOCUSED PHYSICAL EXAM: Limited exam  due to patient complaining of pain. Mild temporal muscle depletion, mild orbital fat loss, mild edema.   Diet Order:   Diet Order             Diet NPO time specified  Diet effective ____           DIET - DYS 1 Room service appropriate? Yes; Fluid consistency: Thin  Diet effective now                   EDUCATION NEEDS:  Not appropriate for education at this time  Skin:  Skin Assessment: Skin Integrity Issues: Skin Integrity Issues:: Stage II Stage II: left hip  Last BM:  10/17  Height:   Ht Readings from Last 1 Encounters:  11/09/21 '5\' 5"'$  (1.651 m)    Weight:   Wt Readings from Last 1 Encounters:  11/10/21 58.1 kg    Ideal Body Weight:   57 kg  BMI:  Body mass index is 21.31 kg/m.  Estimated Nutritional Needs:   Kcal:  1680-1800  Protein:  78-85 gr  Fluid:  1.6-1.7 liters daily  Colman Cater MS,RD,CSG,LDN Contact: Shea Evans

## 2021-11-10 NOTE — Progress Notes (Signed)
   11/10/21 0327  Assess: MEWS Score  BP (!) 80/52  Assess: MEWS Score  MEWS Temp 0  MEWS Systolic 2  MEWS Pulse 1  MEWS RR 0  MEWS LOC 0  MEWS Score 3  MEWS Score Color Yellow  Assess: if the MEWS score is Yellow or Red  Were vital signs taken at a resting state? Yes  Focused Assessment No change from prior assessment  Does the patient meet 2 or more of the SIRS criteria? No  Take Vital Signs  Increase Vital Sign Frequency  Yellow: Q 2hr X 2 then Q 4hr X 2, if remains yellow, continue Q 4hrs  Notify: Charge Nurse/RN  Name of Charge Nurse/RN Notified Deeann Dowse, RN  Date Charge Nurse/RN Notified 11/10/21  Notify: Provider  Provider Name/Title Dr. Josephine Cables  Date Provider Notified 11/10/21  Method of Notification Page  Notification Reason Change in status  Provider response See new orders

## 2021-11-10 NOTE — Hospital Course (Addendum)
PMH of HTN, DM, chronic A-fib on Eliquis, schizophrenia.  Present to the hospital with complaints of a mechanical fall.  Found to have left femoral neck fracture.  Orthopedic consulted.  Cardiology consult for preop clearance due to history of HOCM.  Patient tolerated surgery well.  Now monitoring for postop recovery. Due to RVR patient is currently being transferred to stepdown unit for amiodarone infusion.

## 2021-11-11 ENCOUNTER — Inpatient Hospital Stay (HOSPITAL_COMMUNITY): Payer: Medicare Other | Admitting: Anesthesiology

## 2021-11-11 ENCOUNTER — Other Ambulatory Visit: Payer: Self-pay

## 2021-11-11 ENCOUNTER — Inpatient Hospital Stay (HOSPITAL_COMMUNITY): Payer: Medicare Other

## 2021-11-11 ENCOUNTER — Encounter (HOSPITAL_COMMUNITY): Payer: Self-pay | Admitting: Internal Medicine

## 2021-11-11 ENCOUNTER — Encounter (HOSPITAL_COMMUNITY)
Admission: EM | Disposition: A | Discharge status: Skilled Nursing Facility | Payer: Self-pay | Source: Skilled Nursing Facility | Attending: Internal Medicine

## 2021-11-11 DIAGNOSIS — I110 Hypertensive heart disease with heart failure: Secondary | ICD-10-CM | POA: Diagnosis not present

## 2021-11-11 DIAGNOSIS — I4891 Unspecified atrial fibrillation: Secondary | ICD-10-CM | POA: Diagnosis not present

## 2021-11-11 DIAGNOSIS — Z01810 Encounter for preprocedural cardiovascular examination: Secondary | ICD-10-CM | POA: Diagnosis not present

## 2021-11-11 DIAGNOSIS — S72002A Fracture of unspecified part of neck of left femur, initial encounter for closed fracture: Secondary | ICD-10-CM | POA: Diagnosis not present

## 2021-11-11 DIAGNOSIS — Z87891 Personal history of nicotine dependence: Secondary | ICD-10-CM

## 2021-11-11 DIAGNOSIS — I509 Heart failure, unspecified: Secondary | ICD-10-CM

## 2021-11-11 HISTORY — PX: HIP ARTHROPLASTY: SHX981

## 2021-11-11 LAB — CBC WITH DIFFERENTIAL/PLATELET
Abs Immature Granulocytes: 0.04 10*3/uL (ref 0.00–0.07)
Basophils Absolute: 0 10*3/uL (ref 0.0–0.1)
Basophils Relative: 1 %
Eosinophils Absolute: 0.3 10*3/uL (ref 0.0–0.5)
Eosinophils Relative: 4 %
HCT: 29.7 % — ABNORMAL LOW (ref 36.0–46.0)
Hemoglobin: 9.7 g/dL — ABNORMAL LOW (ref 12.0–15.0)
Immature Granulocytes: 1 %
Lymphocytes Relative: 10 %
Lymphs Abs: 0.7 10*3/uL (ref 0.7–4.0)
MCH: 28.1 pg (ref 26.0–34.0)
MCHC: 32.7 g/dL (ref 30.0–36.0)
MCV: 86.1 fL (ref 80.0–100.0)
Monocytes Absolute: 0.4 10*3/uL (ref 0.1–1.0)
Monocytes Relative: 6 %
Neutro Abs: 6.1 10*3/uL (ref 1.7–7.7)
Neutrophils Relative %: 78 %
Platelets: 164 10*3/uL (ref 150–400)
RBC: 3.45 MIL/uL — ABNORMAL LOW (ref 3.87–5.11)
RDW: 15.2 % (ref 11.5–15.5)
WBC: 7.7 10*3/uL (ref 4.0–10.5)
nRBC: 0 % (ref 0.0–0.2)

## 2021-11-11 LAB — BASIC METABOLIC PANEL
Anion gap: 7 (ref 5–15)
BUN: 10 mg/dL (ref 8–23)
CO2: 26 mmol/L (ref 22–32)
Calcium: 8 mg/dL — ABNORMAL LOW (ref 8.9–10.3)
Chloride: 102 mmol/L (ref 98–111)
Creatinine, Ser: 0.56 mg/dL (ref 0.44–1.00)
GFR, Estimated: >60 mL/min (ref >60)
Glucose, Bld: 178 mg/dL — ABNORMAL HIGH (ref 70–99)
Potassium: 3.7 mmol/L (ref 3.5–5.1)
Sodium: 135 mmol/L (ref 135–145)

## 2021-11-11 LAB — GLUCOSE, CAPILLARY
Glucose-Capillary: 176 mg/dL — ABNORMAL HIGH (ref 70–99)
Glucose-Capillary: 198 mg/dL — ABNORMAL HIGH (ref 70–99)
Glucose-Capillary: 303 mg/dL — ABNORMAL HIGH (ref 70–99)
Glucose-Capillary: 271 mg/dL — ABNORMAL HIGH (ref 70–99)

## 2021-11-11 LAB — MAGNESIUM: Magnesium: 1.8 mg/dL (ref 1.7–2.4)

## 2021-11-11 SURGERY — HEMIARTHROPLASTY, HIP, DIRECT ANTERIOR APPROACH, FOR FRACTURE
Anesthesia: General | Site: Hip | Laterality: Left

## 2021-11-11 MED ORDER — PHENYLEPHRINE 80 MCG/ML (10ML) SYRINGE FOR IV PUSH (FOR BLOOD PRESSURE SUPPORT)
PREFILLED_SYRINGE | INTRAVENOUS | Status: AC
  Filled 2021-11-11: qty 20

## 2021-11-11 MED ORDER — FENTANYL CITRATE PF 50 MCG/ML IJ SOSY
25.0000 ug | PREFILLED_SYRINGE | INTRAMUSCULAR | Status: DC | PRN
  Administered 2021-11-11: 50 ug via INTRAVENOUS
  Filled 2021-11-11: qty 1

## 2021-11-11 MED ORDER — LACTATED RINGERS IV SOLN: INTRAVENOUS | Status: DC

## 2021-11-11 MED ORDER — ALBUMIN HUMAN 5 % IV SOLN: INTRAVENOUS | Status: DC | PRN

## 2021-11-11 MED ORDER — CEFAZOLIN SODIUM-DEXTROSE 2-4 GM/100ML-% IV SOLN
INTRAVENOUS | Status: AC
  Filled 2021-11-11: qty 100

## 2021-11-11 MED ORDER — ONDANSETRON HCL 4 MG PO TABS
4.0000 mg | ORAL_TABLET | Freq: Four times a day (QID) | ORAL | Status: DC | PRN
  Administered 2021-11-12: 4 mg via ORAL
  Filled 2021-11-11: qty 1

## 2021-11-11 MED ORDER — VASOPRESSIN 20 UNIT/ML IV SOLN
INTRAVENOUS | Status: DC | PRN
  Administered 2021-11-11 (×2): 2 [IU] via INTRAVENOUS

## 2021-11-11 MED ORDER — STERILE WATER FOR IRRIGATION IR SOLN
Status: DC | PRN
  Administered 2021-11-11: 1000 mL

## 2021-11-11 MED ORDER — 0.9 % SODIUM CHLORIDE (POUR BTL) OPTIME
TOPICAL | Status: DC | PRN
  Administered 2021-11-11: 1000 mL

## 2021-11-11 MED ORDER — LIDOCAINE HCL (CARDIAC) PF 100 MG/5ML IV SOSY
PREFILLED_SYRINGE | INTRAVENOUS | Status: DC | PRN
  Administered 2021-11-11: 100 mg via INTRAVENOUS

## 2021-11-11 MED ORDER — PRONTOSAN WOUND IRRIGATION OPTIME
TOPICAL | Status: DC | PRN
  Administered 2021-11-11: 1 via TOPICAL

## 2021-11-11 MED ORDER — CHLORHEXIDINE GLUCONATE CLOTH 2 % EX PADS
6.0000 | MEDICATED_PAD | Freq: Every day | CUTANEOUS | Status: DC
  Administered 2021-11-11 – 2021-11-15 (×5): 6 via TOPICAL

## 2021-11-11 MED ORDER — PHENYLEPHRINE HCL-NACL 20-0.9 MG/250ML-% IV SOLN
INTRAVENOUS | Status: AC
  Filled 2021-11-11: qty 250

## 2021-11-11 MED ORDER — METHOCARBAMOL 1000 MG/10ML IJ SOLN: 500.0000 mg | Freq: Four times a day (QID) | INTRAVENOUS | Status: DC | PRN

## 2021-11-11 MED ORDER — EPHEDRINE SULFATE (PRESSORS) 50 MG/ML IJ SOLN
INTRAMUSCULAR | Status: DC | PRN
  Administered 2021-11-11: 10 mg via INTRAVENOUS

## 2021-11-11 MED ORDER — FENTANYL CITRATE (PF) 100 MCG/2ML IJ SOLN
INTRAMUSCULAR | Status: AC
  Filled 2021-11-11: qty 2

## 2021-11-11 MED ORDER — ENOXAPARIN SODIUM 40 MG/0.4ML IJ SOSY
40.0000 mg | PREFILLED_SYRINGE | INTRAMUSCULAR | Status: DC
  Administered 2021-11-12 – 2021-11-14 (×3): 40 mg via SUBCUTANEOUS
  Filled 2021-11-11 (×3): qty 0.4

## 2021-11-11 MED ORDER — BUPIVACAINE LIPOSOME 1.3 % IJ SUSP
INTRAMUSCULAR | Status: DC | PRN
  Administered 2021-11-11: 20 mL

## 2021-11-11 MED ORDER — PHENYLEPHRINE HCL-NACL 20-0.9 MG/250ML-% IV SOLN
INTRAVENOUS | Status: DC | PRN
  Administered 2021-11-11: 50 ug/min via INTRAVENOUS

## 2021-11-11 MED ORDER — BUPIVACAINE-EPINEPHRINE (PF) 0.5% -1:200000 IJ SOLN
INTRAMUSCULAR | Status: AC
  Filled 2021-11-11: qty 30

## 2021-11-11 MED ORDER — POVIDONE-IODINE 10 % EX SWAB
2.0000 | Freq: Once | CUTANEOUS | Status: DC
  Administered 2021-11-11: 2 via TOPICAL

## 2021-11-11 MED ORDER — CEFAZOLIN SODIUM-DEXTROSE 2-4 GM/100ML-% IV SOLN
2.0000 g | Freq: Four times a day (QID) | INTRAVENOUS | Status: AC
  Administered 2021-11-11 (×2): 2 g via INTRAVENOUS
  Filled 2021-11-11 (×2): qty 100

## 2021-11-11 MED ORDER — ALBUMIN HUMAN 5 % IV SOLN
INTRAVENOUS | Status: AC
  Filled 2021-11-11: qty 250

## 2021-11-11 MED ORDER — ONDANSETRON HCL 4 MG/2ML IJ SOLN
INTRAMUSCULAR | Status: DC | PRN
  Administered 2021-11-11: 4 mg via INTRAVENOUS

## 2021-11-11 MED ORDER — DEXAMETHASONE SODIUM PHOSPHATE 10 MG/ML IJ SOLN
INTRAMUSCULAR | Status: AC
  Filled 2021-11-11: qty 1

## 2021-11-11 MED ORDER — METHOCARBAMOL 500 MG PO TABS
500.0000 mg | ORAL_TABLET | Freq: Four times a day (QID) | ORAL | Status: DC | PRN
  Administered 2021-11-12 – 2021-11-16 (×3): 500 mg via ORAL
  Filled 2021-11-11 (×3): qty 1

## 2021-11-11 MED ORDER — FENTANYL CITRATE PF 50 MCG/ML IJ SOSY
PREFILLED_SYRINGE | INTRAMUSCULAR | Status: AC
  Filled 2021-11-11: qty 1

## 2021-11-11 MED ORDER — OXYCODONE HCL 5 MG PO TABS: 5.0000 mg | ORAL_TABLET | Freq: Once | ORAL | Status: DC | PRN

## 2021-11-11 MED ORDER — FENTANYL CITRATE PF 50 MCG/ML IJ SOSY
50.0000 ug | PREFILLED_SYRINGE | Freq: Once | INTRAMUSCULAR | Status: AC
  Administered 2021-11-11: 50 ug via INTRAVENOUS

## 2021-11-11 MED ORDER — TRANEXAMIC ACID-NACL 1000-0.7 MG/100ML-% IV SOLN
INTRAVENOUS | Status: AC
  Filled 2021-11-11: qty 100

## 2021-11-11 MED ORDER — PROPOFOL 10 MG/ML IV BOLUS
INTRAVENOUS | Status: DC | PRN
  Administered 2021-11-11: 100 mg via INTRAVENOUS

## 2021-11-11 MED ORDER — DEXMEDETOMIDINE HCL IN NACL 80 MCG/20ML IV SOLN
INTRAVENOUS | Status: DC | PRN
  Administered 2021-11-11: 12 ug via BUCCAL

## 2021-11-11 MED ORDER — FENTANYL CITRATE (PF) 100 MCG/2ML IJ SOLN
INTRAMUSCULAR | Status: DC | PRN
  Administered 2021-11-11: 25 ug via INTRAVENOUS
  Administered 2021-11-11 (×2): 50 ug via INTRAVENOUS
  Administered 2021-11-11: 25 ug via INTRAVENOUS
  Administered 2021-11-11: 50 ug via INTRAVENOUS

## 2021-11-11 MED ORDER — PHENYLEPHRINE HCL (PRESSORS) 10 MG/ML IV SOLN
INTRAVENOUS | Status: DC | PRN
  Administered 2021-11-11 (×5): 160 ug via INTRAVENOUS

## 2021-11-11 MED ORDER — VASOPRESSIN 20 UNIT/ML IV SOLN
INTRAVENOUS | Status: AC
  Filled 2021-11-11: qty 1

## 2021-11-11 MED ORDER — DOCUSATE SODIUM 100 MG PO CAPS
100.0000 mg | ORAL_CAPSULE | Freq: Two times a day (BID) | ORAL | Status: DC
  Administered 2021-11-11 – 2021-11-16 (×11): 100 mg via ORAL
  Filled 2021-11-11 (×11): qty 1

## 2021-11-11 MED ORDER — OXYCODONE HCL 5 MG/5ML PO SOLN: 5.0000 mg | Freq: Once | ORAL | Status: DC | PRN

## 2021-11-11 MED ORDER — TRANEXAMIC ACID-NACL 1000-0.7 MG/100ML-% IV SOLN
1000.0000 mg | INTRAVENOUS | Status: AC
  Administered 2021-11-11: 1000 mg via INTRAVENOUS

## 2021-11-11 MED ORDER — CEFAZOLIN SODIUM-DEXTROSE 2-4 GM/100ML-% IV SOLN
2.0000 g | INTRAVENOUS | Status: AC
  Administered 2021-11-11: 2 g via INTRAVENOUS

## 2021-11-11 MED ORDER — CHLORHEXIDINE GLUCONATE 0.12 % MT SOLN
15.0000 mL | Freq: Once | OROMUCOSAL | Status: AC
  Administered 2021-11-11: 15 mL via OROMUCOSAL

## 2021-11-11 MED ORDER — ONDANSETRON HCL 4 MG/2ML IJ SOLN
4.0000 mg | Freq: Four times a day (QID) | INTRAMUSCULAR | Status: DC | PRN
  Administered 2021-11-13 – 2021-11-15 (×2): 4 mg via INTRAVENOUS
  Filled 2021-11-11 (×2): qty 2

## 2021-11-11 MED ORDER — DEXAMETHASONE SODIUM PHOSPHATE 10 MG/ML IJ SOLN
INTRAMUSCULAR | Status: DC | PRN
  Administered 2021-11-11: 4 mg via INTRAVENOUS

## 2021-11-11 MED ORDER — CLONAZEPAM 0.5 MG PO TABS
0.5000 mg | ORAL_TABLET | Freq: Three times a day (TID) | ORAL | Status: DC | PRN
  Administered 2021-11-11 – 2021-11-16 (×8): 0.5 mg via ORAL
  Filled 2021-11-11 (×9): qty 1

## 2021-11-11 MED ORDER — ORAL CARE MOUTH RINSE: 15.0000 mL | Freq: Once | OROMUCOSAL | Status: AC

## 2021-11-11 MED ORDER — METOCLOPRAMIDE HCL 5 MG/ML IJ SOLN: 5.0000 mg | Freq: Three times a day (TID) | INTRAMUSCULAR | Status: DC | PRN

## 2021-11-11 MED ORDER — CHLORHEXIDINE GLUCONATE CLOTH 2 % EX PADS
6.0000 | MEDICATED_PAD | Freq: Every day | CUTANEOUS | Status: DC
  Administered 2021-11-11: 6 via TOPICAL

## 2021-11-11 MED ORDER — ONDANSETRON HCL 4 MG/2ML IJ SOLN
INTRAMUSCULAR | Status: AC
  Filled 2021-11-11: qty 2

## 2021-11-11 MED ORDER — MENTHOL 3 MG MT LOZG: 1.0000 | LOZENGE | OROMUCOSAL | Status: DC | PRN

## 2021-11-11 MED ORDER — ONDANSETRON HCL 4 MG/2ML IJ SOLN: 4.0000 mg | Freq: Once | INTRAMUSCULAR | Status: DC | PRN

## 2021-11-11 MED ORDER — METOCLOPRAMIDE HCL 10 MG PO TABS: 5.0000 mg | ORAL_TABLET | Freq: Three times a day (TID) | ORAL | Status: DC | PRN

## 2021-11-11 MED ORDER — PHENOL 1.4 % MT LIQD: 1.0000 | OROMUCOSAL | Status: DC | PRN

## 2021-11-11 MED ORDER — SODIUM CHLORIDE 0.9 % IR SOLN
Status: DC | PRN
  Administered 2021-11-11: 3000 mL

## 2021-11-11 MED ORDER — BUPIVACAINE LIPOSOME 1.3 % IJ SUSP
INTRAMUSCULAR | Status: AC
  Filled 2021-11-11: qty 20

## 2021-11-11 MED ORDER — CHLORHEXIDINE GLUCONATE 4 % EX LIQD
60.0000 mL | Freq: Once | CUTANEOUS | Status: DC
  Administered 2021-11-11: 4 via TOPICAL

## 2021-11-11 MED ORDER — TRANEXAMIC ACID-NACL 1000-0.7 MG/100ML-% IV SOLN
1000.0000 mg | Freq: Once | INTRAVENOUS | Status: AC
  Administered 2021-11-11: 1000 mg via INTRAVENOUS
  Filled 2021-11-11: qty 100

## 2021-11-11 SURGICAL SUPPLY — 54 items
APL PRP STRL LF DISP 70% ISPRP (MISCELLANEOUS)
BIPOLAR DEPUY 48 (Hips) ×1 IMPLANT
BIT DRILL 2.8X128 (BIT) ×2 IMPLANT
BLADE SAGITTAL 25.0X1.27X90 (BLADE) ×2 IMPLANT
CHLORAPREP W/TINT 26 (MISCELLANEOUS) ×2 IMPLANT
CLOTH BEACON ORANGE TIMEOUT ST (SAFETY) ×2 IMPLANT
COVER LIGHT HANDLE STERIS (MISCELLANEOUS) ×4 IMPLANT
DRAPE HIP W/POCKET STRL (MISCELLANEOUS) ×2 IMPLANT
DRAPE U-SHAPE 47X51 STRL (DRAPES) ×2 IMPLANT
DRSG MEPILEX POST OP 4X12 (GAUZE/BANDAGES/DRESSINGS) IMPLANT
DRSG MEPILEX SACRM 8.7X9.8 (GAUZE/BANDAGES/DRESSINGS) ×2 IMPLANT
ELECT REM PT RETURN 9FT ADLT (ELECTROSURGICAL) ×1
ELECTRODE REM PT RTRN 9FT ADLT (ELECTROSURGICAL) ×2 IMPLANT
GAUZE SPONGE 4X4 12PLY STRL (GAUZE/BANDAGES/DRESSINGS) IMPLANT
GLOVE BIO SURGEON STRL SZ7 (GLOVE) IMPLANT
GLOVE BIOGEL PI IND STRL 7.0 (GLOVE) ×4 IMPLANT
GLOVE BIOGEL PI IND STRL 7.5 (GLOVE) IMPLANT
GLOVE SS N UNI LF 8.5 STRL (GLOVE) ×2 IMPLANT
GLOVE SURG POLYISO LF SZ8 (GLOVE) ×4 IMPLANT
GOWN STRL REUS W/TWL LRG LVL3 (GOWN DISPOSABLE) ×4 IMPLANT
GOWN STRL REUS W/TWL XL LVL3 (GOWN DISPOSABLE) ×2 IMPLANT
HANDPIECE INTERPULSE COAX TIP (DISPOSABLE) ×1
HEAD BIPOLAR DEPUY 48 (Hips) IMPLANT
HEAD FEM STD 28X+1.5 STRL (Hips) IMPLANT
INST SET MAJOR BONE (KITS) ×2 IMPLANT
IV NS IRRIG 3000ML ARTHROMATIC (IV SOLUTION) IMPLANT
KIT BLADEGUARD II DBL (SET/KITS/TRAYS/PACK) ×2 IMPLANT
KIT TURNOVER KIT A (KITS) ×2 IMPLANT
MANIFOLD NEPTUNE II (INSTRUMENTS) ×2 IMPLANT
MARKER SKIN DUAL TIP RULER LAB (MISCELLANEOUS) ×2 IMPLANT
NDL HYPO 21X1.5 SAFETY (NEEDLE) ×2 IMPLANT
NEEDLE HYPO 21X1.5 SAFETY (NEEDLE) ×1 IMPLANT
NS IRRIG 1000ML POUR BTL (IV SOLUTION) ×2 IMPLANT
PACK TOTAL JOINT (CUSTOM PROCEDURE TRAY) ×2 IMPLANT
PAD ARMBOARD 7.5X6 YLW CONV (MISCELLANEOUS) ×2 IMPLANT
PIN STMN SNGL STERILE 9X3.6MM (PIN) ×4 IMPLANT
SET BASIN LINEN APH (SET/KITS/TRAYS/PACK) ×2 IMPLANT
SET HNDPC FAN SPRY TIP SCT (DISPOSABLE) ×2 IMPLANT
SOL SCRUB PVP POV-IOD 4OZ 7.5% (MISCELLANEOUS) ×1
SOLUTION SCRB POV-IOD 4OZ 7.5% (MISCELLANEOUS) IMPLANT
SPONGE T-LAP 18X18 ~~LOC~~+RFID (SPONGE) ×4 IMPLANT
STAPLER VISISTAT 35W (STAPLE) ×2 IMPLANT
STEM SUMMIT PRESSFIT SZ 6 (Hips) IMPLANT
SUT BRALON NAB BRD #1 30IN (SUTURE) ×4 IMPLANT
SUT ETHIBOND 5 LR DA (SUTURE) ×4 IMPLANT
SUT MNCRL 0 VIOLET CTX 36 (SUTURE) ×2 IMPLANT
SUT MON AB 2-0 CT1 36 (SUTURE) ×2 IMPLANT
SUT MONOCRYL 0 CTX 36 (SUTURE) ×1
SUT VIC AB 1 CT1 27 (SUTURE) ×9
SUT VIC AB 1 CT1 27XBRD ANTBC (SUTURE) ×4 IMPLANT
SYR 20ML LL LF (SYRINGE) ×6 IMPLANT
SYR BULB IRRIG 60ML STRL (SYRINGE) ×2 IMPLANT
WATER STERILE IRR 1000ML POUR (IV SOLUTION) ×4 IMPLANT
YANKAUER SUCT 12FT TUBE ARGYLE (SUCTIONS) ×2 IMPLANT

## 2021-11-11 NOTE — Op Note (Signed)
11/11/2021  12:11 PM  PATIENT:  [name]  76 y.o. female  PRE-OPERATIVE DIAGNOSIS:  Left femoral neck fracture  POST-OPERATIVE DIAGNOSIS:  Left femoral neck fracture  PROCEDURE:  Procedure(s): ARTHROPLASTY BIPOLAR HIP (HEMIARTHROPLASTY) (Left)  SURGEON:  Surgeon(s) and Role:    Carole Civil, MD - Primary  Implants Summit basic press-fit 6, 1.5 neck, 48 head  Findings complete femoral neck fracture left hip, acetabulum mild grade 1 arthritis  Details of procedure  Ms. Christian was evaluated in the preop area found to have a small 4 mm opening in the skin just posterior to the greater trochanter.  There is no surrounding erythema.  There is fibrinous exudate.  No expressible purulence  The risks of not doing the surgery outweigh the benefits of delaying surgery for wound healing and we proceeded by marking the left hip confirming this with the chart review and x-ray  The patient was brought to the operating for general anesthesia.  She was then placed in the lateral decubitus position with axillary roll and a right axilla left side  Foley catheter was already inserted  The limb was prepped with Betadine  After sterile draping timeout was completed images were available and all implant sizes were available  The incision was centered over the greater trochanter extended approximately 5 cm and distally 2 cm and then divided down to the fascia.  The fascia was split in line with the skin incision.  The patient had a large greater trochanteric bursa which was excised  I checked for any communication with the outside wound and the hip area and there was none  I then divided the abductor musculature starting with the gluteus medius and carried this down to the greater trochanter and subperiosteally dissected from the greater trochanter and tagged it with sutures.  I then split the gluteus minimus and the capsule is 1 full layer and preserved it and expose the hip joint.   The fracture was encountered it was a complete fracture.  This was removed and the head was measured as a size 48.  The acetabular was inspected cleaned and found to have mild chondromalacia with no exposed bone  The hip was dislocated anteriorly.  The proximal preparation was performed with a box osteotome canal finder and trochanteric reamer  Serial broaching was performed up to a size 6 which gave good proximal fit.  Size 1.5 neck and 48 head were used to do a trial reduction.  Trial reduction restored the leg lengths and stability was noted to be good in flexion as well as extension with internal and external rotation  Trial implants were removed 2 holes were drilled in the greater trochanter and a #5 Ethibond suture was passed.  Acetabulum was cleaned and irrigated 1 final time.  Head and neck were placed on the femoral stem and the hip was reduced.  Repeat trial reduction measurements were obtained and the hip was found to be stable with good leg length  Prontosan was used to irrigate the wound followed by injection of full-strength 20 cc exparel in the hip musculature and vastus lateralis  The gluteus minimus and capsule were repaired with #1 Vicryl followed by repair of the gluteus medius with #5 Ethibond suture and oversewn with #1 Vicryl  Prontosan was again used to irrigate the wound and then the fascia was closed with the leg in abduction with #1 Braylon in running fashion  Another dose of Prontosan was used and the subcu was closed  with 0 Monocryl followed by staples  A sterile dressing was applied  The postop plan  Full weightbearing no restrictions Staples out in 14 days Follow up at 28 days DVT prophylaxis for 35 days  Assisted by Vevelyn Royals  Anesthesia General  EBL 300 cc  No blood given  Local anesthetic was exparel 20 cc full-strength  EBL:  300 mL    No drains  No specimens  Counts were correct   TOURNIQUET:  * No tourniquets in log *  DICTATION:  .Dragon Dictation  PLAN OF CARE: Admit to inpatient   PATIENT DISPOSITION:  PACU - hemodynamically stable.   Delay start of Pharmacological VTE agent (>24hrs) due to surgical blood loss or risk of bleeding: no

## 2021-11-11 NOTE — Brief Op Note (Addendum)
11/11/2021  12:11 PM  PATIENT:  [name]  76 y.o. female  PRE-OPERATIVE DIAGNOSIS:  Left femoral neck fracture  POST-OPERATIVE DIAGNOSIS:  Left femoral neck fracture  PROCEDURE:  Procedure(s): ARTHROPLASTY BIPOLAR HIP (HEMIARTHROPLASTY) (Left)  SURGEON:  Surgeon(s) and Role:    Carole Civil, MD - Primary  Implants Summit basic press-fit 6, 1.5 neck, 48 head  Findings complete femoral neck fracture left hip, acetabulum mild grade 1 arthritis  Details of procedure  [name] was evaluated in the preop area found to have a small 4 mm opening in the skin just posterior to the greater trochanter.  There is no surrounding erythema.  There is fibrinous exudate.  No expressible purulence  The risks of not doing the surgery outweigh the benefits of delaying surgery for wound healing and we proceeded by marking the left hip confirming this with the chart review and x-ray  The patient was brought to the operating for general anesthesia.  She was then placed in the lateral decubitus position with axillary roll and a right axilla left side  Foley catheter was already inserted  The limb was prepped with Betadine  After sterile draping timeout was completed images were available and all implant sizes were available  The incision was centered over the greater trochanter extended approximately 5 cm and distally 2 cm and then divided down to the fascia.  The fascia was split in line with the skin incision.  The patient had a large greater trochanteric bursa which was excised  I checked for any communication with the outside wound and the hip area and there was none  I then divided the abductor musculature starting with the gluteus medius and carried this down to the greater trochanter and subperiosteally dissected from the greater trochanter and tagged it with sutures.  I then split the gluteus minimus and the capsule is 1 full layer and preserved it and expose the hip joint.   The fracture was encountered it was a complete fracture.  This was removed and the head was measured as a size 48.  The acetabular was inspected cleaned and found to have mild chondromalacia with no exposed bone  The hip was dislocated anteriorly.  The proximal preparation was performed with a box osteotome canal finder and trochanteric reamer  Serial broaching was performed up to a size 6 which gave good proximal fit.  Size 1.5 neck and 48 head were used to do a trial reduction.  Trial reduction restored the leg lengths and stability was noted to be good in flexion as well as extension with internal and external rotation  Trial implants were removed 2 holes were drilled in the greater trochanter and a #5 Ethibond suture was passed.  Acetabulum was cleaned and irrigated 1 final time.  Head and neck were placed on the femoral stem and the hip was reduced.  Repeat trial reduction measurements were obtained and the hip was found to be stable with good leg length  Prontosan was used to irrigate the wound followed by injection of full-strength 20 cc exparel in the hip musculature and vastus lateralis  The gluteus minimus and capsule were repaired with #1 Vicryl followed by repair of the gluteus medius with #5 Ethibond suture and oversewn with #1 Vicryl  Prontosan was again used to irrigate the wound and then the fascia was closed with the leg in abduction with #1 [name] in running fashion  Another dose of Prontosan was used and the subcu was closed  with 0 Monocryl followed by staples  A sterile dressing was applied  The postop plan  Full weightbearing no restrictions Staples out in 14 days Follow up at 28 days DVT prophylaxis for 35 days  Assisted by Vevelyn Royals  Anesthesia General  EBL 300 cc  No blood given  Local anesthetic was exparel 20 cc full-strength  EBL:  300 mL    No drains  No specimens  Counts were correct   TOURNIQUET:  * No tourniquets in log *  DICTATION:  .Dragon Dictation  PLAN OF CARE: Admit to inpatient   PATIENT DISPOSITION:  PACU - hemodynamically stable.   Delay start of Pharmacological VTE agent (>24hrs) due to surgical blood loss or risk of bleeding: no

## 2021-11-11 NOTE — Progress Notes (Signed)
Morning PO meds not given due to NPO status for surgery this am.

## 2021-11-11 NOTE — Anesthesia Preprocedure Evaluation (Addendum)
Anesthesia Evaluation  Patient identified by MRN, date of birth, ID band Patient awake    Reviewed: Allergy & Precautions, H&P , NPO status , Patient's Chart, lab work & pertinent test results, reviewed documented beta blocker date and time   Airway Mallampati: II  TM Distance: >3 FB Neck ROM: full    Dental no notable dental hx.    Pulmonary neg pulmonary ROS, former smoker,    Pulmonary exam normal breath sounds clear to auscultation       Cardiovascular Exercise Tolerance: Good hypertension, +CHF  + dysrhythmias Atrial Fibrillation  Rhythm:irregular Rate:Normal     Neuro/Psych PSYCHIATRIC DISORDERS Anxiety Depression Schizophrenia CVA, Residual Symptoms negative neurological ROS  negative psych ROS   GI/Hepatic negative GI ROS, Neg liver ROS, GERD  Medicated,  Endo/Other  negative endocrine ROSdiabetes  Renal/GU negative Renal ROS  negative genitourinary   Musculoskeletal   Abdominal   Peds  Hematology negative hematology ROS (+) Blood dyscrasia, anemia ,   Anesthesia Other Findings 1. Severe asymmetric hypertrophy of the basal septum up to 45m. There is  evidence of SAM of the MV leaflets with obstruction up to 37 mmHG  (variation noted due to afib). Findings are consistent with hypertrophic  obstructive cardiomyopathy. Would  consider a cardiac MRI for better characterization. This was not well  characterized on the last echocardiogram. Left ventricular ejection  fraction, by estimation, is 65 to 70%. The left ventricle has normal  function. The left ventricle has no regional  wall motion abnormalities. There is severe asymmetric left ventricular  hypertrophy of the basal-septal segment. Left ventricular diastolic  function could not be evaluated.  2. Right ventricular systolic function is normal. The right ventricular  size is normal.  3. Left atrial size was mildly dilated.  4. Right atrial size was  mildly dilated.  5. The mitral valve is degenerative. Trivial mitral valve regurgitation.  No evidence of mitral stenosis.  6. The aortic valve is tricuspid. There is mild calcification of the  aortic valve. There is mild thickening of the aortic valve. Aortic valve  regurgitation is not visualized. Aortic valve sclerosis/calcification is  present, without any evidence of  aortic stenosis.   Reproductive/Obstetrics negative OB ROS                            Anesthesia Physical Anesthesia Plan  ASA: 3  Anesthesia Plan: General   Post-op Pain Management:    Induction: Intravenous  PONV Risk Score and Plan: Ondansetron  Airway Management Planned: LMA  Additional Equipment:   Intra-op Plan:   Post-operative Plan:   Informed Consent: I have reviewed the patients History and Physical, chart, labs and discussed the procedure including the risks, benefits and alternatives for the proposed anesthesia with the patient or authorized representative who has indicated his/her understanding and acceptance.   Patient has DNR.  Suspend DNR.   Dental Advisory Given  Plan Discussed with: CRNA  Anesthesia Plan Comments:         Anesthesia Quick Evaluation

## 2021-11-11 NOTE — Care Management Important Message (Signed)
Important Message  Patient Details  Name: [name] MRN: [id number] Date of Birth: 02/15/1945   Medicare Important Message Given:  Yes     [name] 11/11/2021, 4:54 PM

## 2021-11-11 NOTE — Anesthesia Postprocedure Evaluation (Signed)
Anesthesia Post Note  Patient: [name]  Procedure(s) Performed: ARTHROPLASTY BIPOLAR HIP (HEMIARTHROPLASTY) (Left: Hip)  Patient location during evaluation: Phase II Anesthesia Type: General Level of consciousness: awake Pain management: pain level controlled Vital Signs Assessment: post-procedure vital signs reviewed and stable Respiratory status: spontaneous breathing and respiratory function stable Cardiovascular status: blood pressure returned to baseline and stable Postop Assessment: no headache and no apparent nausea or vomiting Anesthetic complications: no Comments: Late entry   No notable events documented.   Last Vitals:  Vitals:   11/11/21 1315 11/11/21 1343  BP: 111/68 100/63  Pulse: (!) 110 87  Resp: 19 16  Temp:  36.7 C  SpO2: 96% 90%    Last Pain:  Vitals:   11/11/21 1315  TempSrc:   PainSc: 0-No pain                 Louann Sjogren

## 2021-11-11 NOTE — Consult Note (Signed)
Cardiology Consultation   Patient ID: [name] MRN: [id number]; DOB: 12-17-1945  Admit date: 11/09/2021 Date of Consult: 11/11/2021  PCP:  Moss Mc, [place] Prov[facility] Cardiologist:  Rozann Lesches, MD        Patient Profile:   [name] is a 76 y.o. female with a hx of persistent atrial fibrillation/flutter, HOCM, HTN, HLD, IDDM, SVT, history of DVT and history of CVA who is being seen 11/11/2021 for the evaluation of preoperative cardiac clearance at the request of Dr. Posey Pronto.  History of Present Illness:   [name] was most recently evaluated by the Cardiology service during an admission in 08/2021 for which her primary diagnosis was septic shock in the setting of a UTI. A repeat echocardiogram was obtained during admission which showed hypertrophic obstructive cardiomyopathy with an LVOT gradient of 37 mmHg. She had been on IV Lasix and this was discontinued at that time given her preload dependence. She did have atrial fibrillation with RVR during admission but rates improved as sepsis resolved and she was continued on Amiodarone 200 mg daily along with Toprol-XL 125 mg daily.  She was admitted in 09/2021 for hypotension which was felt to be secondary to volume depletion and Toprol-XL was discontinued and switched to Lopressor 12.5 mg twice daily with her being started on Midodrine '10mg'$  TID.   She presented to [facility] ED on 11/09/2021 after suffering a mechanical fall. Imaging showed an acute fracture of the left femoral neck and given that she was on Eliquis prior to admission, surgery has been delayed until today with plans to perform hemiarthroplasty. She had been continued on Lopressor 12.5 mg twice daily at the time of admission but developed hypotension with this. Midodrine was restarted and she was also started on IV fluids and has been receiving these at 100 mL/hr. Given her hip pain, she has also received several doses of Dilaudid and  Vicodin which could be contributing to her episodes of hypotension (now improved with BP at 108/66 - 140/86 in the past 24 hours).  In talking with the patient this morning, she reports having a mechanical fall prior to admission and did not have any dizziness or presyncope that she is aware of. Her main complaint at this time is hip pain and she is anxious to have surgery today. She denies any recent chest pain or dyspnea on exertion but she is not overly active at baseline and is mostly sitting in a wheelchair during the day. At this time, she denies any recent palpitations.   Past Medical History:  Diagnosis Date   Anxiety    Arthritis    Atrial fibrillation (HCC)    Atrial flutter (HCC)    CHF (congestive heart failure) (HCC)    Collagen vascular disease ([place])    COVID-19 virus infection    COVID-19+ approx 01/24/19; asymptomatic course with full recovery   Dependence on wheelchair    pivot/transfers   Depression    History of psychosis and previous suicide attempt   DVT, lower extremity, recurrent ([place])    Long-term Coumadin per Dr. Legrand Rams   Essential hypertension    GERD (gastroesophageal reflux disease)    Hemiplegia ([place]) 2010   Left side   History of stroke    Acute infarct and right cerebral white matter small vessel disease 12/10   Hyperlipidemia    Hypotension, unspecified    IDA (iron deficiency anemia)    Irritable bowel syndrome without diarrhea  Leg DVT (deep venous thromboembolism), acute ([place]) 2006   Obstructive hypertrophic cardiomyopathy (HCC)    Orthostatic hypotension    Schizophrenia ([place])    Stroke ([place])    left sided weakness   Type 2 diabetes mellitus ([place])    Vitamin D deficiency     Past Surgical History:  Procedure Laterality Date   BACK SURGERY     BIOPSY N/A 11/24/2014   Procedure: BIOPSY;  Surgeon: Danie Binder, MD;  Location: [facility];  Service: Endoscopy;  Laterality: N/A;   BIOPSY  05/17/2016   Procedure: BIOPSY;  Surgeon: Danie Binder,  MD;  Location: [facility];  Service: Endoscopy;;  gastric biopsy   COLONOSCOPY WITH PROPOFOL N/A 11/24/2014   Dr. Rudie Meyer polyps removed/moderate sized internal hemorrhoids, tubular adenomas. Next surveillance in 3 years   ESOPHAGEAL DILATION N/A 07/23/2021   Procedure: ESOPHAGEAL DILATION;  Surgeon: Eloise Harman, DO;  Location: [facility];  Service: Endoscopy;  Laterality: N/A;   ESOPHAGOGASTRODUODENOSCOPY (EGD) WITH PROPOFOL N/A 11/24/2014   Dr. Clayburn Pert HH/patent stricture at the gastroesophageal junction, mild non-erosive gastritis, path negative for H.pylori or celiac sprue   ESOPHAGOGASTRODUODENOSCOPY (EGD) WITH PROPOFOL N/A 05/17/2016   Procedure: ESOPHAGOGASTRODUODENOSCOPY (EGD) WITH PROPOFOL;  Surgeon: Danie Binder, MD;  [facility];  Service: Endoscopy;  Laterality: N/A;  12:45pm   ESOPHAGOGASTRODUODENOSCOPY (EGD) WITH PROPOFOL N/A 07/23/2021   Procedure: ESOPHAGOGASTRODUODENOSCOPY (EGD) WITH PROPOFOL;  Surgeon: Eloise Harman, DO;  Location[facility];  Service: Endoscopy;  Laterality: N/A;   GIVENS CAPSULE STUDY N/A 12/11/2014   MULTILPLE EROSION IN the stomach WITH ACTIVE OOZING. OCCASIONAL EROSIONS AND RARE ULCER SEEN IN PROXIMAL SMALL BOWEL . No masses or AVMs SEEN. NO OLD BLOOD OR FRESH BLOOD SEEN.    POLYPECTOMY N/A 11/24/2014   Procedure: POLYPECTOMY;  Surgeon: Danie Binder, MD;  Location: [facility];  Service: Endoscopy;  Laterality: N/A;   SAVORY DILATION N/A 05/17/2016   Procedure: SAVORY DILATION;  Surgeon: Danie Binder, MD;  [facility];  Service: Endoscopy;  Laterality: N/A;     Home Medications:  Prior to Admission medications   Medication Sig Start Date End Date Taking? Authorizing Provider  ABILIFY 2 MG tablet Take 1 tablet (2 mg total) by mouth daily. 11/09/20  Yes [name], MD  acetaminophen (TYLENOL) 325 MG tablet Take 650 mg by mouth 3 (three) times daily as needed for mild pain or moderate pain.    Yes [provider]  albuterol (VENTOLIN HFA) 108 (90 Base) MCG/ACT inhaler Inhale 2 puffs into the lungs every 6 (six) hours as needed for wheezing or shortness of breath. 05/20/21  Yes Barton Dubois, MD  amiodarone (PACERONE) 200 MG tablet Take 1 tablet (200 mg total) by mouth daily. 04/24/21  Yes Barton Dubois, MD  apixaban (ELIQUIS) 5 MG TABS tablet Take 1 tablet (5 mg total) by mouth 2 (two) times daily. 02/21/20  Yes Richarda Osmond, MD  atorvastatin (LIPITOR) 40 MG tablet Take 1 tablet by mouth at bedtime. 05/30/20  Yes [provider]  busPIRone (BUSPAR) 7.5 MG tablet Take 7.5 mg by mouth 2 (two) times daily. 08/31/21  Yes [provider]  calcium carbonate (TUMS - DOSED IN MG ELEMENTAL CALCIUM) 500 MG chewable tablet Chew 1 tablet by mouth 4 (four) times daily as needed for heartburn.   Yes [provider]  cholecalciferol (VITAMIN D3) 25 MCG (1000 UNIT) tablet Take 2,000 Units by mouth daily.   Yes  [provider]  clonazePAM (KLONOPIN) 0.5 MG tablet Take 1 tablet (0.5 mg total) by mouth 3 (three) times daily as needed for anxiety. 10/13/21  Yes Tat, Shanon Brow, MD  DULoxetine (CYMBALTA) 60 MG capsule Take 1 capsule by mouth daily. 08/26/21  Yes [provider]  HYDROcodone-acetaminophen (NORCO) 10-325 MG tablet Take 1 tablet by mouth every 8 (eight) hours as needed for severe pain. 10/13/21  Yes Tat, Shanon Brow, MD  insulin detemir (LEVEMIR FLEXTOUCH) 100 UNIT/ML FlexPen INJECT 22 UNITS SUBCUTANEOUSLY AT BEDTIME.(HOLD IF BS<70: CALL MD IF BS> 400) Patient taking differently: 22 Units at bedtime. 07/15/21  Yes Reardon, Juanetta Beets, NP  latanoprost (XALATAN) 0.005 % ophthalmic solution Place 1 drop into both eyes at bedtime.   Yes [provider]  lubiprostone (AMITIZA) 8 MCG capsule Take 1 capsule by mouth daily. 08/10/21  Yes [provider]  metoprolol tartrate (LOPRESSOR) 25 MG tablet Take 12.5 mg by mouth 2 (two) times daily.   Yes [provider]  midodrine (PROAMATINE) 10 MG tablet Take 10 mg by mouth 3 (three) times daily.   Yes [provider]  mirtazapine (REMERON) 30 MG tablet Take 30 mg by mouth at bedtime.   Yes [provider]  Multiple Vitamin (MULTIVITAMIN) tablet Take 1 tablet by mouth daily.   Yes [provider]  naloxegol oxalate (MOVANTIK) 12.5 MG TABS tablet Take 1 tablet (12.5 mg total) by mouth daily. 04/24/21  Yes Barton Dubois, MD  NOVOLOG FLEXPEN 100 UNIT/ML FlexPen INJECT SUBCUTANEOUSLY AS FOLLOWS WITH MEALS: 90-150=10u: 151-200=11u: 201-250=12u: 251-300=13u: 301-350=14u: 351-400=15u: BS>400=16u & CALL MD. Patient taking differently: 10-16 Units 3 (three) times daily with meals. INJECT SUBCUTANEOUSLY AS FOLLOWS WITH MEALS: 90-150=10u: 151-200=11u: 201-250=12u: 251-300=13u: 301-350=14u: 351-400=15u: BS>400=16u & CALL MD. 02/28/21  Yes Reardon, Juanetta Beets, NP  ondansetron (ZOFRAN) 4 MG tablet Take 4 mg by mouth 2 (two) times daily. 06/24/21  Yes [provider]  oxybutynin (DITROPAN-XL) 5 MG 24 hr tablet Take 5 mg by mouth daily. 04/20/21  Yes [provider]  pantoprazole (PROTONIX) 40 MG tablet Take 1 tablet (40 mg total) by mouth 2 (two) times daily before a meal. 11/17/20  Yes Mahala Menghini, PA-C  polyethylene glycol (MIRALAX / GLYCOLAX) 17 g packet Take 17 g by mouth daily. 04/24/21  Yes Barton Dubois, MD  risperiDONE (RISPERDAL) 1 MG tablet Take 1 mg by mouth 2 (two) times daily. Take one tablet at 900am and 1 tablet at 5pm and '2mg'$  at bedtime   Yes [provider]  risperiDONE (RISPERDAL) 2 MG tablet Take 2 mg by mouth at bedtime. Take '2mg'$  at bedtime and '1mg'$  at 9am and 5p daily 04/04/21  Yes [provider]  senna (SENOKOT) 8.6 MG tablet Take 1 tablet by mouth daily.   Yes [provider]  TRADJENTA 5 MG TABS tablet TAKE (1) TABLET BY MOUTH ONCE DAILY. Patient taking differently: Take 5 mg by mouth daily. 04/04/21  Yes Brita Romp,  NP  Skin Protectants, Misc. (BAZA PROTECT EX) Apply 1 Application topically in the morning and at bedtime. 08/02/21   [provider]    Inpatient Medications: Scheduled Meds:  (feeding supplement) PROSource Plus  30 mL Oral BID BM   amiodarone  200 mg Oral Daily   atorvastatin  40 mg Oral QHS   DULoxetine  60 mg Oral Daily   feeding supplement  237 mL Oral BID BM   insulin aspart  0-5 Units Subcutaneous QHS   insulin aspart  0-9 Units Subcutaneous TID WC   insulin glargine-yfgn  10 Units Subcutaneous QHS   midodrine  10 mg Oral TID   multivitamin with minerals  1 tablet Oral Daily   pantoprazole  40 mg Oral BID AC   risperiDONE  1 mg Oral 2 times per day   risperiDONE  2 mg Oral QHS   Continuous Infusions:  sodium chloride 100 mL/hr at 11/10/21 2257   PRN Meds: acetaminophen **OR** acetaminophen, HYDROcodone-acetaminophen, HYDROmorphone (DILAUDID) injection, ondansetron (ZOFRAN) IV  Allergies:    Allergies  Allergen Reactions   Sulfa Antibiotics Rash    Social History:   Social History   Socioeconomic History   Marital status: Widowed    Spouse name: Not on file   Number of children: Not on file   Years of education: Not on file   Highest education level: Not on file  Occupational History   Not on file  Tobacco Use   Smoking status: Former    Packs/day: 0.25    Years: 20.00    Total pack years: 5.00    Types: Cigarettes    Quit date: 01/24/1995    Years since quitting: 26.8   Smokeless tobacco: Never  Vaping Use   Vaping Use: Never used  Substance and Sexual Activity   Alcohol use: No    Alcohol/week: 0.0 standard drinks of alcohol   Drug use: No   Sexual activity: Never    Birth control/protection: Post-menopausal  Other Topics Concern   Not on file  Social History Narrative   Not on file   Social Determinants of Health   Financial Resource Strain: Not on file  Food Insecurity: No Food Insecurity (11/10/2021)   Hunger Vital Sign    Worried  About Running Out of Food in the Last Year: Never true    Ran Out of Food in the Last Year: Never true  Transportation Needs: No Transportation Needs (11/10/2021)   PRAPARE - Hydrologist (Medical): No    Lack of Transportation (Non-Medical): No  Physical Activity: Not on file  Stress: Not on file  Social Connections: Not on file  Intimate Partner Violence: Not At Risk (11/10/2021)   Humiliation, Afraid, Rape, and Kick questionnaire    Fear of Current or Ex-Partner: No    Emotionally Abused: No    Physically Abused: No    Sexually Abused: No    Family History:    Family History  Problem Relation Age of Onset   Hypertension Mother    Colon cancer Neg Hx      ROS:  Please see the history of present illness.   All other ROS reviewed and negative.     Physical Exam/Data:   Vitals:   11/10/21 0740 11/10/21 1421 11/10/21 2131 11/11/21 0445  BP: 116/76 (!) 140/86 108/66 112/77  Pulse:  85 96 95  Resp:  '20 18 18  '$ Temp:  97.8 F (36.6 C) 98.1 F (36.7 C) (!) 96.8 F (36 C)  TempSrc:  Oral    SpO2:  99% 100% 100%  Weight:    59 kg  Height:        Intake/Output Summary (Last 24 hours) at 11/11/2021 0806 Last data filed at 11/11/2021 0700 Gross per 24 hour  Intake 480 ml  Output 650 ml  Net -170 ml      11/11/2021    4:45 AM 11/10/2021    6:27 AM 11/09/2021    3:49 PM  Last 3 Weights  Weight (lbs) 130 lb 1.1 oz 128 lb 1.4 oz 119 lb  Weight (kg) 59 kg 58.1 kg 53.978 kg     Body mass index is 21.64 kg/m.  General: Pleasant, elderly female appearing in no acute distress.  HEENT: normal Neck: no JVD Vascular: No carotid bruits; Distal pulses 2+ bilaterally Cardiac: Irregularly irregular, 2/6 SEM along RUSB.  Lungs:  clear to auscultation bilaterally, no wheezing, rhonchi or rales  Abd: soft, nontender, no hepatomegaly  Ext: no pitting edema. SCD's in place.  Skin: warm and dry  Neuro:  CNs 2-12 intact, no focal abnormalities  noted Psych:  Normal affect   EKG:  The EKG was personally reviewed and demonstrates: Atrial flutter with variable block, HR 93. No acute ST changes.   Telemetry:  Telemetry was personally reviewed and demonstrates: Atrial fibrillation/flutter, HR in low-100's to 110's.   Relevant CV Studies:  NST: 09/2019 There was no ST segment deviation noted during stress. In and out of afib during study, The study is normal. Inferior defect likely due to subdiaphragmatic attenuation, cannot completely exclude very mild inferior ischemia. Either findign would support low risk. This is a low risk study. The left ventricular ejection fraction is normal (55-65%).  Echocardiogram: 08/2021 IMPRESSIONS     1. Severe asymmetric hypertrophy of the basal septum up to 47m. There is  evidence of SAM of the MV leaflets with obstruction up to 37 mmHG  (variation noted due to afib). Findings are consistent with hypertrophic  obstructive cardiomyopathy. Would  consider a cardiac MRI for better characterization. This was not well  characterized on the last echocardiogram. Left ventricular ejection  fraction, by estimation, is 65 to 70%. The left ventricle has normal  function. The left ventricle has no regional  wall motion abnormalities. There is severe asymmetric left ventricular  hypertrophy of the basal-septal segment. Left ventricular diastolic  function could not be evaluated.   2. Right ventricular systolic function is normal. The right ventricular  size is normal.   3. Left atrial size was mildly dilated.   4. Right atrial size was mildly dilated.   5. The mitral valve is degenerative. Trivial mitral valve regurgitation.  No evidence of mitral stenosis.   6. The aortic valve is tricuspid. There is mild calcification of the  aortic valve. There is mild thickening of the aortic valve. Aortic valve  regurgitation is not visualized. Aortic valve sclerosis/calcification is  present, without any  evidence of  aortic stenosis.   Laboratory Data:  High Sensitivity Troponin:  No results for input(s): "TROPONINIHS" in the last 720 hours.   Chemistry Recent Labs  Lab 11/09/21 2006 11/10/21 0426  NA 137 136  K 3.8 3.9  CL 104 105  CO2 27 26  GLUCOSE 181* 190*  BUN 12 11  CREATININE 0.63 0.61  CALCIUM 8.3* 7.9*  MG  --  1.6*  GFRNONAA >60 >60  ANIONGAP 6 5    Recent Labs  Lab 11/09/21 2006 11/10/21 0426  PROT 7.1 5.8*  ALBUMIN 3.6 2.8*  AST 18 15  ALT 12 10  ALKPHOS 108 81  BILITOT 1.0 0.6   Lipids No results for input(s): "CHOL", "TRIG", "HDL", "LABVLDL", "LDLCALC", "CHOLHDL" in the last 168 hours.  Hematology Recent Labs  Lab 11/09/21 1737 11/10/21 0426  WBC 10.6* 10.1  RBC 4.09 3.58*  HGB 11.7* 10.1*  HCT 35.1* 30.8*  MCV 85.8 86.0  MCH 28.6 28.2  MCHC 33.3 32.8  RDW 15.2 15.1  PLT 172 186  Thyroid No results for input(s): "TSH", "FREET4" in the last 168 hours.  BNPNo results for input(s): "BNP", "PROBNP" in the last 168 hours.  DDimer No results for input(s): "DDIMER" in the last 168 hours.   Radiology/Studies:  DG Abd Portable 1V  Result Date: 11/10/2021 CLINICAL DATA:  Abdominal discomfort.  Hip fracture. EXAM: PORTABLE ABDOMEN - 1 VIEW COMPARISON:  CT of the abdomen and pelvis 08/13/2021 FINDINGS: Gaseous distention of the stomach is noted. Bowel gas pattern is otherwise normal. IMPRESSION: Gaseous distention of the stomach.  No obstruction Electronically Signed   By: San Morelle M.D.   On: 11/10/2021 13:54   CT Head Wo Contrast  Result Date: 11/09/2021 CLINICAL DATA:  Head trauma EXAM: CT HEAD WITHOUT CONTRAST TECHNIQUE: Contiguous axial images were obtained from the base of the skull through the vertex without intravenous contrast. RADIATION DOSE REDUCTION: This exam was performed according to the departmental dose-optimization program which includes automated exposure control, adjustment of the mA and/or kV according to patient size  and/or use of iterative reconstruction technique. COMPARISON:  CT brain 10/10/2021, 09/21/2021 FINDINGS: Brain: No acute territorial infarction, hemorrhage, or intracranial mass. The ventricles are nonenlarged. Advanced chronic small vessel ischemic changes of the white matter. Vascular: No hyperdense vessels.  Carotid vascular calcification Skull: Normal. Negative for fracture or focal lesion. Sinuses/Orbits: Postsurgical changes of the maxillary and ethmoid sinuses. Other: Chronic anterior subluxation of the mandibular heads. IMPRESSION: 1. No CT evidence for acute intracranial abnormality. 2. Advanced chronic small vessel ischemic changes of the white matter. Electronically Signed   By: Donavan Foil M.D.   On: 11/09/2021 19:24   DG Chest Port 1 View  Result Date: 11/09/2021 CLINICAL DATA:  Fall pain in left hip EXAM: PORTABLE CHEST 1 VIEW COMPARISON:  10/10/2021 FINDINGS: Right CP angle is non included. No sizable effusion. Mild cardiomegaly. Mild diffuse chronic interstitial opacity. Aortic atherosclerosis. Suspect bilateral skin fold artifacts over pneumothoraces. IMPRESSION: 1. Cardiomegaly. 2. Bilateral skin fold artifacts are favored over pneumothoraces. Electronically Signed   By: Donavan Foil M.D.   On: 11/09/2021 18:45   DG Hip Unilat W or Wo Pelvis 2-3 Views Left  Result Date: 11/09/2021 CLINICAL DATA:  Fall.  Left hip pain EXAM: DG HIP (WITH OR WITHOUT PELVIS) 2-3V LEFT COMPARISON:  03/09/2021 FINDINGS: Diffuse osseous demineralization. Acute fracture of the left femoral neck with mild superolateral displacement and varus angulation. No dislocation. Pelvic bony ring appears intact. IMPRESSION: Acute fracture of the left femoral neck. Electronically Signed   By: Davina Poke D.O.   On: 11/09/2021 18:42     Assessment and Plan:   1. Preoperative Cardiac Clearance for Hip Hemiarthroplasty - She presented after suffering a mechanical fall and is planning to undergo surgical repair  today following washout of Eliquis since admission. While she did have hypertrophic obstructive cardiomyopathy by prior echocardiogram in 08/2021, she does not have a history of ACS and she appears euvolemic on examination today as well. She has been receiving fluids as outlined above and will continue with this given her preload dependence due to HOCM. EKG is without acute ST changes. - Her RCRI risk is 6.6% risk of a major cardiac event. She is actually on her way to the OR at this time but would not anticipate further cardiac testing prior to surgery. Will continue with IV fluids in the post-operative setting and continue to follow on telemetry as she is likely to have continually elevated rates in the setting of postoperative pain and will  likely require resumption of low-dose Lopressor soon.   2. Persistent Atrial Fibrillation/Flutter - This was previously paroxysmal but appears to have been more persistent over her past several admissions. Given issues with hypotension, she has been continued on Amiodarone 200 mg daily for some rate control effect. Was on Lopressor 12.5 mg twice daily prior to admission which was held given hypotension and this is likely exacerbated by receiving pain medications. She remains on Midodrine 10 mg 3 times daily as well.  Would anticipate restarting AV nodal blocking agents in the postoperative setting as BP allows.  - She was on Eliquis 5 mg twice daily prior to admission which was held leading up to surgery. Would plan to restart once cleared to do so from an orthopedic perspective.   3. HOCM - Her LVOT gradient was at 37 by echocardiogram in 08/2021. Would consider a follow-up echocardiogram as an outpatient as this was obtained in the setting of septic shock. Could also consider a cardiac MRI in the future.  - Continue with IV fluids and try to avoid dehydration in the postoperative setting.    4. Orthostatic Hypotension - She has been continued on Midodrine '10mg'$   TID.   5. History of DVT  - She was on Eliquis prior to admission which is currently held. Plan to restart following surgery once cleared by Ortho to do so.    For questions or updates, please contact [place] Please consult [email or web address] for contact info under    Signed, Erma Heritage, PA-C  11/11/2021 8:06 AM

## 2021-11-11 NOTE — Progress Notes (Signed)
Progress Note Patient: [name] MWN:[id number] DOB: [birth year] DOA: 11/09/2021  DOS: the patient was seen and examined on 11/11/2021  Brief hospital course: PMH of HTN, DM, chronic A-fib on Eliquis, schizophrenia.  Present to the hospital with complaints of a mechanical fall.  Found to have left femoral neck fracture.  Orthopedic consulted.  Patient scheduled for surgery tomorrow.  Cardiology consult for preop clearance due to history of HOCM.  Patient reports abdominal pain. Assessment and Plan: Closed left femoral neck fracture Left hip x-ray showed acute fracture of the left femoral neck Continue IV Dilaudid 0.5 mg every 4 hours as needed for moderate/severe pain Orthopedic surgeon Dr. Aline Brochure.  Underwent left hip arthroplasty Monitor for postop recovery.   Chronic atrial fibrillation with RVR Continue amiodarone Eliquis will be held at this time in anticipation for surgical repair on Friday (10/20)   Prolonged QT interval QTc 534m Avoid QT prolonging drugs Magnesium level will be checked Continue telemetry   T2DM with hyperglycemia Continue Semglee 10 units nightly and adjust dose accordingly Continue ISS and hypoglycemia protocol   GERD Continue Protonix   Chronic diastolic CHF HOCM Patient appears euvolemic clinically Echocardiogram done in August of this year showed LVEF of 65 to 70%, no RWMA, +HOCM Continue total input/output, daily weights and fluid restriction Cardiology was consulted for preop clearance.  Patient tolerated procedure well.  ATyheecardiology consultation. Avoid hypotension avoid dehydration.   Stricture and stenosis of esophagus EGD done on 07/23/2021 showed  food in the middle third and lower third of the esophagus.   A distal esophageal stricture was noted. Barium swallow done on 07/25/2021 showed nonspecific esophageal dysmotility with omission of primary peristaltic waves in the mid esophagus, and tertiary contractions in the  esophagus Continue Protonix Continue dysphagia 1 diet X-ray abdomen showed some gaseous distention.  For now patient is able to tolerate oral diet will monitor.   Essential hypertension Continue metoprolol   Mixed hyperlipidemia Continue Lipitor   Schizophrenia Continue Risperdal   Depression Continue Cymbalta  Anxiety. We will add Klonopin.  Subjective: Patient reports anxiety postoperatively.  No nausea no vomiting.  No fever no chills.  Passing gas.  Physical Exam: Vitals:   11/11/21 1245 11/11/21 1300 11/11/21 1315 11/11/21 1343  BP: 123/69 127/69 111/68 100/63  Pulse: (!) 136 89 (!) 110 87  Resp: 19 (!) '21 19 16  '$ Temp:    98.1 F (36.7 C)  TempSrc:      SpO2: 96% 98% 96% 90%  Weight:      Height:       General: Appear in mild distress; no visible Abnormal Neck Mass Or lumps, Conjunctiva normal Cardiovascular: S1 and S2 Present, aortic systolic  Murmur, Respiratory: good respiratory effort, Bilateral Air entry present and CTA, no Crackles, no wheezes Abdomen: Bowel Sound present, Non tender Extremities: no Pedal edema Neurology: alert and oriented to Self, Place and time.  Data Reviewed: I have Reviewed nursing notes, Vitals, and Lab results since pt's last encounter. Pertinent lab results CBC and BMP I have ordered test including CBC and BMP    Family Communication: No one at bedside  Disposition: Status is: Inpatient Remains inpatient appropriate because: Monitor for postop recovery  enoxaparin (LOVENOX) injection 40 mg Start: 11/12/21 0800 SCDs Start: 11/11/21 1340 Place TED hose Start: 11/11/21 1340 SCDs Start: 11/09/21 2113   Level of care: Telemetry Telemetry reviewed, shows NSR sinus tachycardia, prolonged QTc Author: PBerle Mull MD 11/11/2021 7:06 PM Please look on [email or web address] to find out  who is on call.

## 2021-11-11 NOTE — Anesthesia Procedure Notes (Signed)
Procedure Name: LMA Insertion Date/Time: 11/11/2021 10:34 AM  Performed by: Jonna Munro, CRNAPre-anesthesia Checklist: Patient identified, Emergency Drugs available, Suction available, Patient being monitored and Timeout performed Patient Re-evaluated:Patient Re-evaluated prior to induction Oxygen Delivery Method: Circle system utilized Preoxygenation: Pre-oxygenation with 100% oxygen Induction Type: IV induction LMA: LMA inserted LMA Size: 4.0 Placement Confirmation: positive ETCO2, CO2 detector and breath sounds checked- equal and bilateral Tube secured with: Tape Dental Injury: Teeth and Oropharynx as per pre-operative assessment

## 2021-11-11 NOTE — Transfer of Care (Signed)
Immediate Anesthesia Transfer of Care Note  Patient: [name]  Procedure(s) Performed: ARTHROPLASTY BIPOLAR HIP (HEMIARTHROPLASTY) (Left: Hip)  Patient Location: PACU  Anesthesia Type:General  Level of Consciousness: awake, alert  and patient cooperative  Airway & Oxygen Therapy: Patient Spontanous Breathing and Patient connected to face mask oxygen  Post-op Assessment: Report given to RN, Post -op Vital signs reviewed and stable and Patient moving all extremities X 4  Post vital signs: Reviewed and stable  Last Vitals:  Vitals Value Taken Time  BP 143/82 11/11/21 1220  Temp    Pulse 118 11/11/21 1229  Resp 18 11/11/21 1229  SpO2 100 % 11/11/21 1229  Vitals shown include unvalidated device data.  Last Pain:  Vitals:   11/11/21 0905  TempSrc: Oral  PainSc: 9       Patients Stated Pain Goal: 5 (02/22/41 8887)  Complications: No notable events documented.

## 2021-11-11 NOTE — Interval H&P Note (Signed)
History and Physical Interval Note:  11/11/2021 10:22 AM  [name]  has presented today for surgery, with the diagnosis of Left femoral neck fracture.  The various methods of treatment have been discussed with the patient and family. After consideration of risks, benefits and other options for treatment, the patient has consented to  Procedure(s): ARTHROPLASTY BIPOLAR HIP (HEMIARTHROPLASTY) (Left) as a surgical intervention.  The patient's history has been reviewed, patient examined, left hip their is a skin lesion 2 mm superficial with fibrinous exudate but she is still  stable for surgery.  I have reviewed the patient's chart and labs.  Questions were answered to the patient's satisfaction.     Arther Abbott

## 2021-11-12 DIAGNOSIS — S72002A Fracture of unspecified part of neck of left femur, initial encounter for closed fracture: Secondary | ICD-10-CM | POA: Diagnosis not present

## 2021-11-12 LAB — CBC
HCT: 24.8 % — ABNORMAL LOW (ref 36.0–46.0)
Hemoglobin: 8.2 g/dL — ABNORMAL LOW (ref 12.0–15.0)
MCH: 28.5 pg (ref 26.0–34.0)
MCHC: 33.1 g/dL (ref 30.0–36.0)
MCV: 86.1 fL (ref 80.0–100.0)
Platelets: 144 10*3/uL — ABNORMAL LOW (ref 150–400)
RBC: 2.88 MIL/uL — ABNORMAL LOW (ref 3.87–5.11)
RDW: 14.9 % (ref 11.5–15.5)
WBC: 10.6 10*3/uL — ABNORMAL HIGH (ref 4.0–10.5)
nRBC: 0 % (ref 0.0–0.2)

## 2021-11-12 LAB — BASIC METABOLIC PANEL
Anion gap: 4 — ABNORMAL LOW (ref 5–15)
BUN: 9 mg/dL (ref 8–23)
CO2: 28 mmol/L (ref 22–32)
Calcium: 8.1 mg/dL — ABNORMAL LOW (ref 8.9–10.3)
Chloride: 105 mmol/L (ref 98–111)
Creatinine, Ser: 0.48 mg/dL (ref 0.44–1.00)
GFR, Estimated: >60 mL/min (ref >60)
Glucose, Bld: 164 mg/dL — ABNORMAL HIGH (ref 70–99)
Potassium: 3.7 mmol/L (ref 3.5–5.1)
Sodium: 137 mmol/L (ref 135–145)

## 2021-11-12 LAB — GLUCOSE, CAPILLARY
Glucose-Capillary: 196 mg/dL — ABNORMAL HIGH (ref 70–99)
Glucose-Capillary: 239 mg/dL — ABNORMAL HIGH (ref 70–99)
Glucose-Capillary: 126 mg/dL — ABNORMAL HIGH (ref 70–99)

## 2021-11-12 MED ORDER — OXYCODONE HCL 5 MG PO TABS
5.0000 mg | ORAL_TABLET | ORAL | Status: DC | PRN
  Administered 2021-11-12 – 2021-11-16 (×14): 5 mg via ORAL
  Filled 2021-11-12 (×15): qty 1

## 2021-11-12 NOTE — Progress Notes (Signed)
Progress Note Patient: [name] IEP:[id number] DOB: [birth year] DOA: 11/09/2021  DOS: the patient was seen and examined on 11/12/2021  Brief hospital course: PMH of HTN, DM, chronic A-fib on Eliquis, schizophrenia.  Present to the hospital with complaints of a mechanical fall.  Found to have left femoral neck fracture.  Orthopedic consulted.  Cardiology consult for preop clearance due to history of HOCM.  Patient tolerated surgery well.  Now monitoring for postop recovery. Assessment and Plan: Closed left femoral neck fracture Left hip x-ray showed acute fracture of the left femoral neck Continue IV Dilaudid 0.5 mg every 4 hours as needed for moderate/severe pain Orthopedic surgeon Dr. Aline Brochure.  Underwent left hip arthroplasty Monitor for postop recovery. Hemoglobin dropped from 9.7-8.2 postoperatively.  Acute postop expected blood loss anemia.  Monitor.   Chronic atrial fibrillation with RVR Continue amiodarone Resume Eliquis once cleared by orthopedics.  Prolonged QT interval QTc 564m Avoid QT prolonging drugs Magnesium level will be checked Continue telemetry   T2DM with hyperglycemia Continue Semglee 10 units nightly and adjust dose accordingly Continue ISS and hypoglycemia protocol   GERD Continue Protonix   Chronic diastolic CHF HOCM Patient appears euvolemic clinically Echocardiogram done in August of this year showed LVEF of 65 to 70%, no RWMA, +HOCM Continue total input/output, daily weights and fluid restriction Cardiology was consulted for preop clearance.  Patient tolerated procedure well.  ANormangeecardiology consultation. Avoid hypotension avoid dehydration.   Stricture and stenosis of esophagus EGD done on 07/23/2021 showed  food in the middle third and lower third of the esophagus.   A distal esophageal stricture was noted. Barium swallow done on 07/25/2021 showed nonspecific esophageal dysmotility with omission of primary peristaltic waves in the mid  esophagus, and tertiary contractions in the esophagus Continue Protonix Continue dysphagia 1 diet X-ray abdomen showed some gaseous distention.  For now patient is able to tolerate oral diet will monitor.   Essential hypertension Continue metoprolol   Mixed hyperlipidemia Continue Lipitor   Schizophrenia Continue Risperdal   Depression Continue Cymbalta  Anxiety. We will add Klonopin.  Patient tells me that the anxiety medication is not making any improvement although does not appear to be severely anxious.  Subjective: no Nausea no vomiting no fever no chills.  Reports mild abdominal pain.  Physical Exam: Vitals:   11/12/21 0108 11/12/21 0500 11/12/21 0541 11/12/21 1338  BP: (!) 131/94  118/70 116/72  Pulse: 76  (!) 107 100  Resp: (!) '21  18 18  '$ Temp: 97.7 F (36.5 C)  98.4 F (36.9 C) 98.6 F (37 C)  TempSrc:    Oral  SpO2: 100%  100% 95%  Weight:  60 kg    Height:       General: Appear in mild distress; no visible Abnormal Neck Mass Or lumps, Conjunctiva normal Cardiovascular: S1 and S2 Present, aortic systolic  Murmur, Respiratory: good respiratory effort, Bilateral Air entry present and CTA, no Crackles, no wheezes Abdomen: Bowel Sound present, Non tender Extremities: no Pedal edema Neurology: alert and oriented to Self, Place and time.  Data Reviewed: I have Reviewed nursing notes, Vitals, and Lab results since pt's last encounter. Pertinent lab results CBC and BMP I have ordered test including CBC and BMP     Family Communication: No one at bedside  Disposition: Status is: Inpatient Remains inpatient appropriate because: Monitor for postop recovery  enoxaparin (LOVENOX) injection 40 mg Start: 11/12/21 0800 SCDs Start: 11/11/21 1340 Place TED hose Start: 11/11/21 1340 SCDs Start: 11/09/21  2113   Level of care: Telemetry Telemetry reviewed, shows NSR sinus tachycardia, QTc resolved. Author: Berle Mull, MD 11/12/2021 7:11 PM Please look on  [email or web address] to find out who is on call.

## 2021-11-12 NOTE — Plan of Care (Signed)
  Problem: Acute Rehab PT Goals(only PT should resolve) Goal: Pt Will Go Supine/Side To Sit Outcome: Progressing Flowsheets (Taken 11/12/2021 1113) Pt will go Supine/Side to Sit: with moderate assist Goal: Patient Will Perform Sitting Balance Outcome: Progressing Flowsheets (Taken 11/12/2021 1113) Patient will perform sitting balance: with moderate assist Goal: Patient Will Transfer Sit To/From Stand Outcome: Progressing Flowsheets (Taken 11/12/2021 1113) Patient will transfer sit to/from stand: with moderate assist Goal: Pt Will Transfer Bed To Chair/Chair To Bed Outcome: Progressing Flowsheets (Taken 11/12/2021 1113) Pt will Transfer Bed to Chair/Chair to Bed: with mod assist Goal: Pt Will Ambulate Outcome: Progressing Flowsheets (Taken 11/12/2021 1113) Pt will Ambulate:  15 feet  with moderate assist  with rolling walker

## 2021-11-12 NOTE — Progress Notes (Signed)
Patient ID: [name], female   DOB: [birth year], 76 y.o.   MRN: [id number]  BP 118/70 (BP Location: Left Arm)   Pulse (!) 107   Temp 98.4 F (36.9 C)   Resp 18   Ht '5\' 5"'$  (1.651 m)   Wt 60 kg   SpO2 100%   BMI 22.01 kg/m   POD left bipolar  C/o pain on 0.5 Dilaudid and 5 mg hydrocodone   Dressing is dry   Post of films look good  Limb alignment normal   No signs of DVT   WBAT   Change to PO oxycodone

## 2021-11-12 NOTE — Evaluation (Signed)
Physical Therapy Evaluation Patient Details Name: [name] MRN: [id number] DOB: 1945-10-27 Today's Date: 11/12/2021  History of Present Illness  [name] is a 76 y.o. female with medical history significant of hypertension, T2DM, A-fib on Eliquis, schizophrenia who presents to the emergency department from [place] via EMS due to 2 falls today.  X-ray of the hip was done and patient was noted to have left femoral neck fracture.  EMS was activated and patient was sent to the ED for further evaluation and management.  Patient denies hitting her head when she fell, she denies chest pain, shortness of breath, fever, chills, nausea or vomiting.    Clinical Impression  Patient lying in bed on therapist arrival.  PT stopped by earlier this morning as RN was administering pain medication and due to patient high pain level came back about an hour later.  Patient still rates her pain 10/10.  Therapist guides patient in AAROM left leg ankle pumps and knee flexion /extension.  Max assist to roll in bed and then patient refuses to sit up or attempt to stand today despite multiple attempts from therapist.  PT will attempt to sit her up/ stand her next visit. Patient will benefit from continued skilled therapy services during the remainder of her hospital stay and at the next recommended venue of care to address deficits and promote return to optimal function.         Recommendations for follow up therapy are one component of a multi-disciplinary discharge planning process, led by the attending physician.  Recommendations may be updated based on patient status, additional functional criteria and insurance authorization.  Follow Up Recommendations Skilled nursing-short term rehab (<3 hours/day) Can patient physically be transported by private vehicle: No    Assistance Recommended at Discharge Frequent or constant Supervision/Assistance  Patient can return home with the following   A lot of help with walking and/or transfers;A lot of help with bathing/dressing/bathroom;Help with stairs or ramp for entrance    Equipment Recommendations None recommended by PT  Recommendations for Other Services       Functional Status Assessment Patient has had a recent decline in their functional status and demonstrates the ability to make significant improvements in function in a reasonable and predictable amount of time.     Precautions / Restrictions Precautions Precautions: Fall Restrictions Weight Bearing Restrictions: No      Mobility  Bed Mobility Overal bed mobility: Needs Assistance Bed Mobility: Rolling Rolling: Max assist           Patient Response: Anxious  Transfers                        Ambulation/Gait                  Stairs            Wheelchair Mobility    Modified Rankin (Stroke Patients Only)       Balance                                             Pertinent Vitals/Pain Pain Assessment Pain Assessment: 0-10 Pain Score: 10-Worst pain ever Pain Location: left hip Pain Descriptors / Indicators: Guarding Pain Intervention(s): Limited activity within patient's tolerance, Monitored during session, Premedicated before session    Home Living  Additional Comments: long term care    Prior Function Prior Level of Function : Needs assist       Physical Assist : Mobility (physical);ADLs (physical) Mobility (physical): Bed mobility;Transfers;Gait;Stairs   Mobility Comments: Assisted for transfers and a few steps using RW during transfers ADLs Comments: asissted by ALF staff. Pt reports independence with toileting, grooming, and eating.     Hand Dominance   Dominant Hand: Right    Extremity/Trunk Assessment   Upper Extremity Assessment Upper Extremity Assessment: Generalized weakness    Lower Extremity Assessment Lower Extremity Assessment: LLE  deficits/detail LLE Deficits / Details: s/p left hip hemiarthroplasty 10/20 LLE: Unable to fully assess due to pain       Communication   Communication: No difficulties  Cognition Arousal/Alertness: Awake/alert Behavior During Therapy: WFL for tasks assessed/performed, Agitated Overall Cognitive Status: Within Functional Limits for tasks assessed                                 General Comments: declined anything more than PROM left leg        General Comments      Exercises     Assessment/Plan    PT Assessment Patient needs continued PT services  PT Problem List Decreased strength;Decreased activity tolerance;Decreased balance;Decreased mobility;Pain;Decreased skin integrity       PT Treatment Interventions DME instruction;Gait training;Balance training;Functional mobility training;Therapeutic activities;Therapeutic exercise;Patient/family education    PT Goals (Current goals can be found in the Care Plan section)  Acute Rehab PT Goals Patient Stated Goal: return to long term care facility PT Goal Formulation: With patient Time For Goal Achievement: 11/25/21 Potential to Achieve Goals: Good    Frequency Min 4X/week     Co-evaluation               AM-PAC PT "6 Clicks" Mobility  Outcome Measure Help needed turning from your back to your side while in a flat bed without using bedrails?: A Lot Help needed moving from lying on your back to sitting on the side of a flat bed without using bedrails?: Total Help needed moving to and from a bed to a chair (including a wheelchair)?: Total Help needed standing up from a chair using your arms (e.g., wheelchair or bedside chair)?: Total Help needed to walk in hospital room?: Total Help needed climbing 3-5 steps with a railing? : Total 6 Click Score: 7    End of Session   Activity Tolerance: Patient limited by pain Patient left: in bed;with call bell/phone within reach;with bed alarm set   PT Visit  Diagnosis: Repeated falls (R29.6);Unsteadiness on feet (R26.81);Other abnormalities of gait and mobility (R26.89);History of falling (Z91.81);Muscle weakness (generalized) (M62.81);Difficulty in walking, not elsewhere classified (R26.2)    Time: 0942-1000 PT Time Calculation (min) (ACUTE ONLY): 18 min   Charges:   PT Evaluation $PT Eval Low Complexity: 1 Low          11:13 AM, 11/12/21 Joliyah Lippens Small Yanel Dombrosky MPT [facility] [place] [phone]

## 2021-11-13 ENCOUNTER — Inpatient Hospital Stay (HOSPITAL_COMMUNITY): Payer: Medicare Other

## 2021-11-13 DIAGNOSIS — S72002A Fracture of unspecified part of neck of left femur, initial encounter for closed fracture: Secondary | ICD-10-CM | POA: Diagnosis not present

## 2021-11-13 LAB — BASIC METABOLIC PANEL
Anion gap: 7 (ref 5–15)
BUN: 10 mg/dL (ref 8–23)
CO2: 27 mmol/L (ref 22–32)
Calcium: 7.7 mg/dL — ABNORMAL LOW (ref 8.9–10.3)
Chloride: 102 mmol/L (ref 98–111)
Creatinine, Ser: 0.49 mg/dL (ref 0.44–1.00)
GFR, Estimated: >60 mL/min (ref >60)
Glucose, Bld: 132 mg/dL — ABNORMAL HIGH (ref 70–99)
Potassium: 3.5 mmol/L (ref 3.5–5.1)
Sodium: 136 mmol/L (ref 135–145)

## 2021-11-13 LAB — GLUCOSE, CAPILLARY
Glucose-Capillary: 164 mg/dL — ABNORMAL HIGH (ref 70–99)
Glucose-Capillary: 190 mg/dL — ABNORMAL HIGH (ref 70–99)
Glucose-Capillary: 163 mg/dL — ABNORMAL HIGH (ref 70–99)
Glucose-Capillary: 237 mg/dL — ABNORMAL HIGH (ref 70–99)
Glucose-Capillary: 229 mg/dL — ABNORMAL HIGH (ref 70–99)

## 2021-11-13 LAB — CBC WITH DIFFERENTIAL/PLATELET
Abs Immature Granulocytes: 0.03 10*3/uL (ref 0.00–0.07)
Basophils Absolute: 0 10*3/uL (ref 0.0–0.1)
Basophils Relative: 0 %
Eosinophils Absolute: 0.3 10*3/uL (ref 0.0–0.5)
Eosinophils Relative: 4 %
HCT: 27.3 % — ABNORMAL LOW (ref 36.0–46.0)
Hemoglobin: 8.8 g/dL — ABNORMAL LOW (ref 12.0–15.0)
Immature Granulocytes: 0 %
Lymphocytes Relative: 18 %
Lymphs Abs: 1.4 10*3/uL (ref 0.7–4.0)
MCH: 28.1 pg (ref 26.0–34.0)
MCHC: 32.2 g/dL (ref 30.0–36.0)
MCV: 87.2 fL (ref 80.0–100.0)
Monocytes Absolute: 0.6 10*3/uL (ref 0.1–1.0)
Monocytes Relative: 8 %
Neutro Abs: 5.4 10*3/uL (ref 1.7–7.7)
Neutrophils Relative %: 70 %
Platelets: 150 10*3/uL (ref 150–400)
RBC: 3.13 MIL/uL — ABNORMAL LOW (ref 3.87–5.11)
RDW: 14.8 % (ref 11.5–15.5)
WBC: 7.7 10*3/uL (ref 4.0–10.5)
nRBC: 0 % (ref 0.0–0.2)

## 2021-11-13 LAB — CBC
HCT: 23.1 % — ABNORMAL LOW (ref 36.0–46.0)
Hemoglobin: 7.5 g/dL — ABNORMAL LOW (ref 12.0–15.0)
MCH: 28.4 pg (ref 26.0–34.0)
MCHC: 32.5 g/dL (ref 30.0–36.0)
MCV: 87.5 fL (ref 80.0–100.0)
Platelets: 159 10*3/uL (ref 150–400)
RBC: 2.64 MIL/uL — ABNORMAL LOW (ref 3.87–5.11)
RDW: 15.5 % (ref 11.5–15.5)
WBC: 7.9 10*3/uL (ref 4.0–10.5)
nRBC: 0 % (ref 0.0–0.2)

## 2021-11-13 LAB — PREPARE RBC (CROSSMATCH)

## 2021-11-13 MED ORDER — SODIUM CHLORIDE 0.9 % IV BOLUS: 500.0000 mL | Freq: Once | INTRAVENOUS | Status: DC

## 2021-11-13 MED ORDER — AMIODARONE HCL IN DEXTROSE 360-4.14 MG/200ML-% IV SOLN
60.0000 mg/h | INTRAVENOUS | Status: DC
  Administered 2021-11-13: 60 mg/h via INTRAVENOUS
  Filled 2021-11-13: qty 200

## 2021-11-13 MED ORDER — AMIODARONE HCL 200 MG PO TABS: 200.0000 mg | ORAL_TABLET | Freq: Every day | ORAL | Status: DC

## 2021-11-13 MED ORDER — AMIODARONE HCL 200 MG PO TABS
200.0000 mg | ORAL_TABLET | Freq: Two times a day (BID) | ORAL | Status: DC
  Administered 2021-11-14: 200 mg via ORAL
  Filled 2021-11-13: qty 1

## 2021-11-13 MED ORDER — AMIODARONE HCL IN DEXTROSE 360-4.14 MG/200ML-% IV SOLN
30.0000 mg/h | INTRAVENOUS | Status: DC
  Administered 2021-11-13: 30 mg/h via INTRAVENOUS
  Filled 2021-11-13 (×4): qty 200

## 2021-11-13 MED ORDER — SODIUM CHLORIDE 0.9% IV SOLUTION: Freq: Once | INTRAVENOUS | Status: AC

## 2021-11-13 NOTE — Progress Notes (Signed)
Physical Therapy Treatment Patient Details Name: [name] MRN: [id number] DOB: May 17, 1945 Today's Date: 11/13/2021   History of Present Illness [name] is a 76 y.o. female with medical history significant of hypertension, T2DM, A-fib on Eliquis, schizophrenia who presents to the emergency department from [place] via EMS due to 2 falls today.  X-ray of the hip was done and patient was noted to have left femoral neck fracture.  EMS was activated and patient was sent to the ED for further evaluation and management.  Patient denies hitting her head when she fell, she denies chest pain, shortness of breath, fever, chills, nausea or vomiting.    PT Comments    Patient receiving a pint of blood on therapist arrival but agreeable to sitting up on edge of bed.  Patient needs max A for supine to sit and mod A to maintain sitting balance on edge of bed.  Patient grimaces and yells, moans with pain with supine to sit and sit to supine.  Patient will benefit from continued skilled therapy services during the remainder of her hospital stay and at the next recommended venue of care to address deficits and promote return to optimal function.       Recommendations for follow up therapy are one component of a multi-disciplinary discharge planning process, led by the attending physician.  Recommendations may be updated based on patient status, additional functional criteria and insurance authorization.  Follow Up Recommendations  Skilled nursing-short term rehab (<3 hours/day) Can patient physically be transported by private vehicle: No   Assistance Recommended at Discharge Frequent or constant Supervision/Assistance  Patient can return home with the following A lot of help with walking and/or transfers;A lot of help with bathing/dressing/bathroom;Help with stairs or ramp for entrance   Equipment Recommendations  None recommended by PT    Recommendations for Other Services        Precautions / Restrictions Precautions Precautions: Fall Restrictions Weight Bearing Restrictions: No     Mobility  Bed Mobility Overal bed mobility: Needs Assistance Bed Mobility: Sit to Supine, Supine to Sit Rolling: Max assist   Supine to sit: Max assist Sit to supine: Max assist   General bed mobility comments: pain and soreness limit patient tolerance for mobility    Transfers                   General transfer comment: did not attempt transfer or stand today; patient recieving blood    Ambulation/Gait                   Stairs             Wheelchair Mobility    Modified Rankin (Stroke Patients Only)       Balance                                            Cognition Arousal/Alertness: Awake/alert Behavior During Therapy: WFL for tasks assessed/performed, Agitated Overall Cognitive Status: Within Functional Limits for tasks assessed                                 General Comments: patient getting a pint of blood        Exercises Other Exercises Other Exercises: PROM left ankle and knee    General Comments  Pertinent Vitals/Pain Pain Assessment Pain Assessment: 0-10 Pain Score: 9  Pain Location: left hip Pain Descriptors / Indicators: Guarding Pain Intervention(s): Limited activity within patient's tolerance, Monitored during session    Home Living                          Prior Function            PT Goals (current goals can now be found in the care plan section) Acute Rehab PT Goals Patient Stated Goal: return to long term care facility PT Goal Formulation: With patient Time For Goal Achievement: 11/25/21 Potential to Achieve Goals: Good Progress towards PT goals: Progressing toward goals    Frequency           PT Plan Current plan remains appropriate    Co-evaluation              AM-PAC PT "6 Clicks" Mobility   Outcome Measure  Help  needed turning from your back to your side while in a flat bed without using bedrails?: A Lot Help needed moving from lying on your back to sitting on the side of a flat bed without using bedrails?: Total Help needed moving to and from a bed to a chair (including a wheelchair)?: Total Help needed standing up from a chair using your arms (e.g., wheelchair or bedside chair)?: Total Help needed to walk in hospital room?: Total Help needed climbing 3-5 steps with a railing? : Total 6 Click Score: 7    End of Session   Activity Tolerance: Patient limited by pain Patient left: in bed;with call bell/phone within reach;with bed alarm set   PT Visit Diagnosis: Repeated falls (R29.6);Unsteadiness on feet (R26.81);Other abnormalities of gait and mobility (R26.89);History of falling (Z91.81);Muscle weakness (generalized) (M62.81);Difficulty in walking, not elsewhere classified (R26.2)     Time: 7412-8786 PT Time Calculation (min) (ACUTE ONLY): 16 min  Charges:  $Therapeutic Activity: 8-22 mins                     1:46 PM, 11/13/21 Analiah Drum Small Andrea Ferrer MPT [facility] [place] [phone] [phone]

## 2021-11-13 NOTE — Progress Notes (Signed)
   11/13/21 1042  Assess: MEWS Score  Temp 98.3 F (36.8 C)  BP (!) 98/47  Pulse Rate (!) 138  Resp 20  SpO2 100 %  O2 Device Nasal Cannula  O2 Flow Rate (L/min) 2 L/min  Assess: MEWS Score  MEWS Temp 0  MEWS Systolic 1  MEWS Pulse 3  MEWS RR 0  MEWS LOC 0  MEWS Score 4  MEWS Score Color Red  Assess: if the MEWS score is Yellow or Red  Were vital signs taken at a resting state? Yes  Focused Assessment No change from prior assessment  Does the patient meet 2 or more of the SIRS criteria? No  Treat  MEWS Interventions Other (Comment) (MD notified)  Pain Scale 0-10  Pain Score 6  Pain Type Surgical pain  Take Vital Signs  Increase Vital Sign Frequency  Red: Q 1hr X 4 then Q 4hr X 4, if remains red, continue Q 4hrs  Notify: Charge Nurse/RN  Name of Charge Nurse/RN Notified Zenaida Deed  Date Charge Nurse/RN Notified 11/13/21  Time Charge Nurse/RN Notified 1042  Notify: Provider  Provider Name/Title Patel  Date Provider Notified 11/13/21  Time Provider Notified 1042  Method of Notification Page  Notification Reason Other (Comment) (Change in vital signs)  Provider response No new orders  Date of Provider Response 11/13/21  Time of Provider Response 1044  Assess: SIRS CRITERIA  SIRS Temperature  0  SIRS Pulse 1  SIRS Respirations  0  SIRS WBC 1  SIRS Score Sum  2

## 2021-11-13 NOTE — Progress Notes (Signed)
Pt is in A-fib, HR bouncing between teens and 150. Dr. Josephine Cables notified. Eliquis is currently on hold until cleared by orthopedics d/t surgery on 11/11/21 for left femur fx.

## 2021-11-13 NOTE — Evaluation (Signed)
Clinical/Bedside Swallow Evaluation Patient Details  Name: [name] MRN: [id number] Date of Birth: Dec 04, 1945  Today's Date: 11/13/2021 Time: SLP Start Time (ACUTE ONLY): [phone] SLP Stop Time (ACUTE ONLY): 1008 SLP Time Calculation (min) (ACUTE ONLY): 26 min  Past Medical History:  Past Medical History:  Diagnosis Date   Anxiety    Arthritis    Atrial fibrillation (HCC)    Atrial flutter (HCC)    CHF (congestive heart failure) (HCC)    Collagen vascular disease ([place])    COVID-19 virus infection    COVID-19+ approx 01/24/19; asymptomatic course with full recovery   Dependence on wheelchair    pivot/transfers   Depression    History of psychosis and previous suicide attempt   DVT, lower extremity, recurrent ([place])    Long-term Coumadin per Dr. Legrand Rams   Essential hypertension    GERD (gastroesophageal reflux disease)    Hemiplegia ([place]) 2010   Left side   History of stroke    Acute infarct and right cerebral white matter small vessel disease 12/10   Hyperlipidemia    Hypotension, unspecified    IDA (iron deficiency anemia)    Irritable bowel syndrome without diarrhea    Leg DVT (deep venous thromboembolism), acute ([place]) 2006   Obstructive hypertrophic cardiomyopathy (HCC)    Orthostatic hypotension    Schizophrenia ([place])    Stroke ([place])    left sided weakness   Type 2 diabetes mellitus ([place])    Vitamin D deficiency    Past Surgical History:  Past Surgical History:  Procedure Laterality Date   BACK SURGERY     BIOPSY N/A 11/24/2014   Procedure: BIOPSY;  Surgeon: Danie Binder, MD;  Location: [facility];  Service: Endoscopy;  Laterality: N/A;   BIOPSY  05/17/2016   Procedure: BIOPSY;  Surgeon: Danie Binder, MD;  Location: [facility];  Service: Endoscopy;;  gastric biopsy   COLONOSCOPY WITH PROPOFOL N/A 11/24/2014   Dr. Rudie Meyer polyps removed/moderate sized internal hemorrhoids, tubular adenomas. Next surveillance in 3 years   ESOPHAGEAL DILATION N/A 07/23/2021    Procedure: ESOPHAGEAL DILATION;  Surgeon: Eloise Harman, DO;  Location: [facility];  Service: Endoscopy;  Laterality: N/A;   ESOPHAGOGASTRODUODENOSCOPY (EGD) WITH PROPOFOL N/A 11/24/2014   Dr. Clayburn Pert HH/patent stricture at the gastroesophageal junction, mild non-erosive gastritis, path negative for H.pylori or celiac sprue   ESOPHAGOGASTRODUODENOSCOPY (EGD) WITH PROPOFOL N/A 05/17/2016   Procedure: ESOPHAGOGASTRODUODENOSCOPY (EGD) WITH PROPOFOL;  Surgeon: Danie Binder, MD;  [facility];  Service: Endoscopy;  Laterality: N/A;  12:45pm   ESOPHAGOGASTRODUODENOSCOPY (EGD) WITH PROPOFOL N/A 07/23/2021   Procedure: ESOPHAGOGASTRODUODENOSCOPY (EGD) WITH PROPOFOL;  Surgeon: Eloise Harman, DO;  [facility];  Service: Endoscopy;  Laterality: N/A;   GIVENS CAPSULE STUDY N/A 12/11/2014   MULTILPLE EROSION IN the stomach WITH ACTIVE OOZING. OCCASIONAL EROSIONS AND RARE ULCER SEEN IN PROXIMAL SMALL BOWEL . No masses or AVMs SEEN. NO OLD BLOOD OR FRESH BLOOD SEEN.    POLYPECTOMY N/A 11/24/2014   Procedure: POLYPECTOMY;  Surgeon: Danie Binder, MD;  Location: [facility];  Service: Endoscopy;  Laterality: N/A;   SAVORY DILATION N/A 05/17/2016   Procedure: SAVORY DILATION;  Surgeon: Danie Binder, MD;  [facility];  Service: Endoscopy;  Laterality: N/A;   HPI:  [name] is a 76 y.o. female with medical history significant of hypertension, T2DM, A-fib on Eliquis, schizophrenia who presents to the emergency department from [place] via EMS  due to 2 falls today.  X-ray of the hip was done and patient was noted to have left femoral neck fracture.  EMS was activated and patient was sent to the ED for further evaluation and management.  Patient denies hitting her head when she fell, she denies chest pain, shortness of breath, fever, chills, nausea or vomiting. Pt underwent left hip arthroplasty on 11/11/21. Hemoglobin dropped from 9.7-8.2 postoperatively.   Acute postop expected blood loss anemia. EGD done on 07/23/2021 showed  food in the middle third and lower third of the esophagus.   A distal esophageal stricture was noted.  Barium swallow done on 07/25/2021 showed nonspecific esophageal dysmotility with omission of primary peristaltic waves in the mid esophagus, and tertiary contractions in the esophagus. BSE requested.    Assessment / Plan / Recommendation  Clinical Impression  Clinical swallow evaluation completed at bedside. Pt known to SLP service from previous admissions. Pt with h/o esophageal dysphagia (see summary). Pt is edentulous, however she indicates that she typically wears U/L dentures (not found in room). Pt consumed straw sips of thin liquids with one cough after initially having difficulty sucking from the straw. No further coughing throughout trials. Pt with inconsistent responses to questions regarding typical diet texture. She initially stated that she eats pureed foods due to "surgery" and pointed to her chest- assumes she means esophageal impaction and removal. She then stated that she does eat some foods that require chewing. She consumed graham crackers with slow oral prep, but ultimately cleared. She also stated that she takes her medications crushed in puree. Continue diet as ordered, D1/puree and thin liquids with aspiration and reflux precautions and SLP will follow during acute stay SLP Visit Diagnosis: Dysphagia, unspecified (R13.10)    Aspiration Risk  Mild aspiration risk    Diet Recommendation Dysphagia 1 (Puree);Thin liquid   Liquid Administration via: Cup;Straw Medication Administration: Crushed with puree Supervision: Staff to assist with self feeding;Full supervision/cueing for compensatory strategies Compensations: Slow rate;Small sips/bites Postural Changes: Seated upright at 90 degrees;Remain upright for at least 30 minutes after po intake    Other  Recommendations Oral Care Recommendations: Oral care  BID;Staff/trained caregiver to provide oral care Other Recommendations: Clarify dietary restrictions    Recommendations for follow up therapy are one component of a multi-disciplinary discharge planning process, led by the attending physician.  Recommendations may be updated based on patient status, additional functional criteria and insurance authorization.  Follow up Recommendations Follow physician's recommendations for discharge plan and follow up therapies      Assistance Recommended at Discharge Frequent or constant Supervision/Assistance  Functional Status Assessment Patient has had a recent decline in their functional status and demonstrates the ability to make significant improvements in function in a reasonable and predictable amount of time.  Frequency and Duration min 2x/week  1 week       Prognosis Prognosis for Safe Diet Advancement: Good Barriers to Reach Goals: Other (Comment) (esophageal dysphagia)      Swallow Study   General Date of Onset: 11/09/21 HPI: [name] is a 76 y.o. female with medical history significant of hypertension, T2DM, A-fib on Eliquis, schizophrenia who presents to the emergency department from [place] via EMS due to 2 falls today.  X-ray of the hip was done and patient was noted to have left femoral neck fracture.  EMS was activated and patient was sent to the ED for further evaluation and management.  Patient denies hitting her head when she fell, she  denies chest pain, shortness of breath, fever, chills, nausea or vomiting. Pt underwent left hip arthroplasty on 11/11/21. Hemoglobin dropped from 9.7-8.2 postoperatively.  Acute postop expected blood loss anemia. EGD done on 07/23/2021 showed  food in the middle third and lower third of the esophagus.   A distal esophageal stricture was noted.  Barium swallow done on 07/25/2021 showed nonspecific esophageal dysmotility with omission of primary peristaltic waves in the mid esophagus, and  tertiary contractions in the esophagus. BSE requested. Type of Study: Bedside Swallow Evaluation Previous Swallow Assessment: BSE August 2023 D1/thin Diet Prior to this Study: Dysphagia 1 (puree);Thin liquids Temperature Spikes Noted: No Respiratory Status: Nasal cannula History of Recent Intubation: No Behavior/Cognition: Alert;Cooperative;Pleasant mood Oral Cavity Assessment: Within Functional Limits Oral Care Completed by SLP: Yes Oral Cavity - Dentition: Edentulous Vision: Impaired for self-feeding Self-Feeding Abilities: Total assist Patient Positioning: Upright in bed Baseline Vocal Quality: Normal Volitional Cough: Weak Volitional Swallow: Able to elicit    Oral/Motor/Sensory Function Overall Oral Motor/Sensory Function: Within functional limits   Ice Chips Ice chips: Within functional limits Presentation: Spoon   Thin Liquid Thin Liquid: Within functional limits Presentation: Cup;Straw Other Comments:  (cough x1 after difficulty generating a straw sip, but no further coughing)    Nectar Thick Nectar Thick Liquid: Not tested   Honey Thick Honey Thick Liquid: Not tested   Puree Puree: Within functional limits Presentation: Spoon   Solid   Thank you,  Genene Churn, CCC-SLP [phone]   Solid: Impaired Presentation: Spoon Oral Phase Impairments: Impaired mastication Oral Phase Functional Implications: Prolonged oral transit     [name] 11/13/2021,11:07 AM

## 2021-11-13 NOTE — Progress Notes (Signed)
Patients heart rate goes between 100's-140's. Patient received a unit of blood and heart rate continues to go between 100's-140's MD aware. MD placed order to move patient to stepdown to be placed on amiodarone drip. Report called to receiving nurse. Will transfer patient to stepdown.

## 2021-11-13 NOTE — Progress Notes (Signed)
Progress Note Patient: [name] XNA:[id number] DOB: [birth year] DOA: 11/09/2021  DOS: the patient was seen and examined on 11/13/2021  Brief hospital course: PMH of HTN, DM, chronic A-fib on Eliquis, schizophrenia.  Present to the hospital with complaints of a mechanical fall.  Found to have left femoral neck fracture.  Orthopedic consulted.  Cardiology consult for preop clearance due to history of HOCM.  Patient tolerated surgery well.  Now monitoring for postop recovery. Due to RVR patient is currently being transferred to stepdown unit for amiodarone infusion.  Assessment and Plan: Closed left femoral neck fracture Left hip x-ray showed acute fracture of the left femoral neck Continue IV Dilaudid 0.5 mg every 4 hours as needed for moderate/severe pain Orthopedic surgeon Dr. Aline Brochure.  Underwent left hip arthroplasty Monitor for postop recovery.  Acute postop expected blood loss anemia. Hemoglobin dropped from 10-7.5. Blood pressure also hypotensive. Transfuse 1 PRBC on 10/22. I will continue Lovenox for now for DVT prophylaxis unless her H&H drops further. Repeat hemoglobin actually is adequately elevated. Consent received from legal guardian on call representative.  Chronic atrial fibrillation with RVR Heart rate elevated. Patient not a good candidate for beta-blocker therapy for now due to hypotension. Will initiate amiodarone drip. Transfer to stepdown. Continue to hold Eliquis for now due to anemia.  Prolonged QT interval Avoid QT prolonging drugs Continue telemetry   Type 2 diabetes mellitus, uncontrolled with hyperglycemia with long-term insulin use. Continue Semglee 10 units nightly and adjust dose accordingly Continue ISS and hypoglycemia protocol   GERD Continue Protonix   Chronic diastolic CHF HOCM Patient appears euvolemic clinically Echocardiogram done in August of this year showed LVEF of 65 to 70%, no RWMA, +HOCM Continue total input/output, daily  weights and fluid restriction Cardiology was consulted for preop clearance.  Patient tolerated procedure well.  [place] cardiology consultation. Avoid hypotension avoid dehydration.   Stricture and stenosis of esophagus EGD done on 07/23/2021 showed  food in the middle third and lower third of the esophagus.   A distal esophageal stricture was noted. Barium swallow done on 07/25/2021 showed nonspecific esophageal dysmotility with omission of primary peristaltic waves in the mid esophagus, and tertiary contractions in the esophagus Continue Protonix Continue dysphagia 1 diet X-ray abdomen showed some gaseous distention.  For now patient is able to tolerate oral diet will monitor.   Essential hypertension Continue metoprolol   Mixed hyperlipidemia Continue Lipitor   Schizophrenia Continue Risperdal   Depression Continue Cymbalta  Anxiety. We will add Klonopin.  Patient tells me that the anxiety medication is not making any improvement although does not appear to be severely anxious.  Subjective: Mildly hypotensive.  No vomiting.  Continues to complaining of hip pain, nausea and abdominal discomfort.  Also reports anxiety.  Physical Exam: Vitals:   11/13/21 1515 11/13/21 1530 11/13/21 1545 11/13/21 1625  BP: 118/63 118/67 130/62   Pulse: (!) 127 (!) 134 (!) 124 (!) 119  Resp: (!) 24 (!) 28 (!) 25 (!) 23  Temp:    98.8 F (37.1 C)  TempSrc:    Oral  SpO2: 92% 93% 92% 92%  Weight:      Height:       General: Appear in mild distress; no visible Abnormal Neck Mass Or lumps, Conjunctiva normal Cardiovascular: S1 and S2 Present, aortic systolic Murmur, Respiratory: good respiratory effort, Bilateral Air entry present and CTA, no Crackles, no wheezes Abdomen: Bowel Sound present, Non tender Extremities: no Pedal edema Neurology: alert and oriented to Self, Place  Data Reviewed: I have Reviewed nursing notes, Vitals, and Lab results since pt's last encounter. Pertinent lab  results CBC and BMP I have ordered test including CBC and BMP      Family Communication: No one at bedside discussed with legal guardian service on call.  Disposition: Status is: Inpatient Remains inpatient appropriate because: Monitor for postop recovery currently being transferred to stepdown unit for IV amiodarone drip.  enoxaparin (LOVENOX) injection 40 mg Start: 11/12/21 0800 SCDs Start: 11/11/21 1340 Place TED hose Start: 11/11/21 1340 SCDs Start: 11/09/21 2113   Level of care: Stepdown continue telemetry due to RVR.  Author: Berle Mull, MD 11/13/2021 5:45 PM Please look on [email or web address] to find out who is on call.

## 2021-11-14 ENCOUNTER — Telehealth: Payer: Self-pay | Admitting: Orthopedic Surgery

## 2021-11-14 DIAGNOSIS — S72002A Fracture of unspecified part of neck of left femur, initial encounter for closed fracture: Secondary | ICD-10-CM | POA: Diagnosis not present

## 2021-11-14 LAB — GLUCOSE, CAPILLARY
Glucose-Capillary: 156 mg/dL — ABNORMAL HIGH (ref 70–99)
Glucose-Capillary: 225 mg/dL — ABNORMAL HIGH (ref 70–99)
Glucose-Capillary: 278 mg/dL — ABNORMAL HIGH (ref 70–99)
Glucose-Capillary: 234 mg/dL — ABNORMAL HIGH (ref 70–99)
Glucose-Capillary: 208 mg/dL — ABNORMAL HIGH (ref 70–99)

## 2021-11-14 LAB — CBC
HCT: 26.6 % — ABNORMAL LOW (ref 36.0–46.0)
Hemoglobin: 8.9 g/dL — ABNORMAL LOW (ref 12.0–15.0)
MCH: 29.2 pg (ref 26.0–34.0)
MCHC: 33.5 g/dL (ref 30.0–36.0)
MCV: 87.2 fL (ref 80.0–100.0)
Platelets: 136 10*3/uL — ABNORMAL LOW (ref 150–400)
RBC: 3.05 MIL/uL — ABNORMAL LOW (ref 3.87–5.11)
RDW: 14.6 % (ref 11.5–15.5)
WBC: 6.2 10*3/uL (ref 4.0–10.5)
nRBC: 0 % (ref 0.0–0.2)

## 2021-11-14 LAB — BASIC METABOLIC PANEL
Anion gap: 8 (ref 5–15)
BUN: 7 mg/dL — ABNORMAL LOW (ref 8–23)
CO2: 27 mmol/L (ref 22–32)
Calcium: 7.5 mg/dL — ABNORMAL LOW (ref 8.9–10.3)
Chloride: 99 mmol/L (ref 98–111)
Creatinine, Ser: 0.44 mg/dL (ref 0.44–1.00)
GFR, Estimated: >60 mL/min (ref >60)
Glucose, Bld: 185 mg/dL — ABNORMAL HIGH (ref 70–99)
Potassium: 3.5 mmol/L (ref 3.5–5.1)
Sodium: 134 mmol/L — ABNORMAL LOW (ref 135–145)

## 2021-11-14 LAB — TYPE AND SCREEN
ABO/RH(D): B POS
Antibody Screen: NEGATIVE
Unit division: 0

## 2021-11-14 LAB — BPAM RBC
Blood Product Expiration Date: 202311162359
ISSUE DATE / TIME: 202310221052
Unit Type and Rh: 1700

## 2021-11-14 MED ORDER — METOPROLOL TARTRATE 25 MG PO TABS
25.0000 mg | ORAL_TABLET | Freq: Two times a day (BID) | ORAL | Status: DC
  Administered 2021-11-14 – 2021-11-16 (×5): 25 mg via ORAL
  Filled 2021-11-14 (×5): qty 1

## 2021-11-14 MED ORDER — AMIODARONE HCL IN DEXTROSE 360-4.14 MG/200ML-% IV SOLN
60.0000 mg/h | INTRAVENOUS | Status: AC
  Administered 2021-11-14: 60 mg/h via INTRAVENOUS
  Filled 2021-11-14: qty 200

## 2021-11-14 MED ORDER — AMIODARONE HCL 200 MG PO TABS: 400.0000 mg | ORAL_TABLET | Freq: Every day | ORAL | Status: DC

## 2021-11-14 MED ORDER — OXYBUTYNIN CHLORIDE ER 5 MG PO TB24
5.0000 mg | ORAL_TABLET | Freq: Every day | ORAL | Status: DC
  Administered 2021-11-14 – 2021-11-16 (×3): 5 mg via ORAL
  Filled 2021-11-14 (×3): qty 1

## 2021-11-14 MED ORDER — POTASSIUM CHLORIDE CRYS ER 20 MEQ PO TBCR
20.0000 meq | EXTENDED_RELEASE_TABLET | Freq: Once | ORAL | Status: AC
  Administered 2021-11-14: 20 meq via ORAL
  Filled 2021-11-14: qty 1

## 2021-11-14 MED ORDER — APIXABAN 5 MG PO TABS: 5.0000 mg | ORAL_TABLET | Freq: Two times a day (BID) | ORAL | Status: DC

## 2021-11-14 MED ORDER — AMIODARONE HCL IN DEXTROSE 360-4.14 MG/200ML-% IV SOLN
30.0000 mg/h | INTRAVENOUS | Status: DC
  Administered 2021-11-14 – 2021-11-16 (×4): 30 mg/h via INTRAVENOUS
  Filled 2021-11-14 (×3): qty 200

## 2021-11-14 MED ORDER — NALOXEGOL OXALATE 12.5 MG PO TABS
12.5000 mg | ORAL_TABLET | Freq: Every day | ORAL | Status: DC
  Administered 2021-11-15 – 2021-11-16 (×2): 12.5 mg via ORAL
  Filled 2021-11-14 (×4): qty 1

## 2021-11-14 MED ORDER — LUBIPROSTONE 8 MCG PO CAPS
8.0000 ug | ORAL_CAPSULE | Freq: Every day | ORAL | Status: DC
  Administered 2021-11-15 – 2021-11-16 (×2): 8 ug via ORAL
  Filled 2021-11-14 (×4): qty 1

## 2021-11-14 MED ORDER — BUSPIRONE HCL 5 MG PO TABS
7.5000 mg | ORAL_TABLET | Freq: Two times a day (BID) | ORAL | Status: DC
  Administered 2021-11-14 – 2021-11-16 (×5): 7.5 mg via ORAL
  Filled 2021-11-14 (×5): qty 2

## 2021-11-14 MED ORDER — AMIODARONE HCL 200 MG PO TABS: 400.0000 mg | ORAL_TABLET | Freq: Two times a day (BID) | ORAL | Status: DC

## 2021-11-14 MED ORDER — AMIODARONE HCL 200 MG PO TABS
200.0000 mg | ORAL_TABLET | Freq: Once | ORAL | Status: AC
  Administered 2021-11-14: 200 mg via ORAL
  Filled 2021-11-14: qty 1

## 2021-11-14 MED ORDER — APIXABAN 5 MG PO TABS
5.0000 mg | ORAL_TABLET | Freq: Two times a day (BID) | ORAL | Status: DC
  Administered 2021-11-14 – 2021-11-16 (×4): 5 mg via ORAL
  Filled 2021-11-14 (×4): qty 1

## 2021-11-14 MED ORDER — MIRTAZAPINE 30 MG PO TABS
30.0000 mg | ORAL_TABLET | Freq: Every day | ORAL | Status: DC
  Administered 2021-11-14 – 2021-11-15 (×2): 30 mg via ORAL
  Filled 2021-11-14 (×2): qty 1

## 2021-11-14 MED ORDER — AMIODARONE LOAD VIA INFUSION
150.0000 mg | Freq: Once | INTRAVENOUS | Status: AC
  Administered 2021-11-14: 150 mg via INTRAVENOUS
  Filled 2021-11-14: qty 83.34

## 2021-11-14 MED ORDER — POLYETHYLENE GLYCOL 3350 17 G PO PACK
17.0000 g | PACK | Freq: Every day | ORAL | Status: DC
  Administered 2021-11-14 – 2021-11-16 (×3): 17 g via ORAL
  Filled 2021-11-14 (×3): qty 1

## 2021-11-14 NOTE — Progress Notes (Signed)
Called by primary team regarding afib with RVR. Initial consult by cardiology service 11/11/21 for preop evaluation and patient signed off. Admitted with mechanical fall and hip fracure, now s/p hip surgery. History of persistent afib, now with RVR in postop period. Historically afib has been difficult to control due to low bp's, she is on midodrine, lopressor, amio at home with amio being for additional rate control.   Currently on amio gtt, lopressor '25mg'$  bid, midodrine '10mg'$  tid. BP's 120s/70s, she does have intermittent low bp's 90s/40s at times. See how rates respond to amio gtt, additional option would be digoxin load. Hopefully further into postop period drive for tachycardia will lessen. Also issues with postop anemia to 7.5. Rates 100-110s currently which is reasonable, we will f/u in AM. If high rates overnight could dose IV digoxin 0.'25mg'$  IV every 6 hours x 3 doses.   Carlyle Dolly MD

## 2021-11-14 NOTE — Progress Notes (Signed)
PT Cancellation Note  Patient Details Name: [name] MRN: [id number] DOB: Aug 04, 1945   Cancelled Treatment:    Reason Eval/Treat Not Completed: Medical issues which prohibited therapy.  Patient transferred to a higher level of care and will need new PT consult to resume therapy when patient is medically stable.  Thank you.    7:44 AM, 11/14/21 Lonell Grandchild, MPT Physical Therapist with [facility] 336 [phone] office [phone] mobile phone

## 2021-11-14 NOTE — Progress Notes (Signed)
Progress Note Patient: [name]:[id number] DOB: 07-31-1945 DOA: 11/09/2021  DOS: the patient was seen and examined on 11/14/2021  Brief hospital course: PMH of HTN, DM, chronic A-fib on Eliquis, schizophrenia.  Present to the hospital with complaints of a mechanical fall.  Found to have left femoral neck fracture.  Orthopedic consulted.  Cardiology consult for preop clearance due to history of HOCM.  Patient tolerated surgery well.  Now monitoring for postop recovery. Due to RVR patient is currently being transferred to stepdown unit for amiodarone infusion.  Assessment and Plan: Closed left femoral neck fracture Left hip x-ray showed acute fracture of the left femoral neck Continue IV Dilaudid 0.5 mg every 4 hours as needed for moderate/severe pain Orthopedic surgeon Dr. Aline Brochure. Underwent left hip arthroplasty Monitor for postop recovery.   Acute postop expected blood loss anemia. Hemoglobin dropped from 10-7.5. Blood pressure also hypotensive. Transfuse 1 PRBC on 10/22. Repeat hemoglobin actually is adequately elevated.  And remained stable. We will initiate Eliquis which is her home regimen. Consent received from legal guardian on call representative.   Chronic atrial fibrillation with RVR Heart rate elevated. Patient not a good candidate for beta-blocker therapy for now due to hypotension. Patient was started on amiodarone drip.  Heart rate improved on the drip but worsened again. Cardiology was reconsulted. Started on Lopressor as well.  But due to persistent tachycardia back on amiodarone drip Currently tolerating it well. If requires per cardiology patient can receive digoxin load. Continue in stepdown unit. Continue Eliquis.   Prolonged QT interval Avoid QT prolonging drugs Continue telemetry   Type 2 diabetes mellitus, uncontrolled with hyperglycemia with long-term insulin use. Continue Semglee 10 units nightly and adjust dose accordingly Continue ISS and  hypoglycemia protocol   GERD Continue Protonix   Chronic diastolic CHF HOCM Patient appears euvolemic clinically Echocardiogram done in August of this year showed LVEF of 65 to 70%, no RWMA, +HOCM Continue total input/output, daily weights and fluid restriction Cardiology was consulted for preop clearance.  Patient tolerated procedure well.  [place] cardiology consultation. Avoid hypotension avoid dehydration.   Stricture and stenosis of esophagus EGD done on 07/23/2021 showed  food in the middle third and lower third of the esophagus.   A distal esophageal stricture was noted. Barium swallow done on 07/25/2021 showed nonspecific esophageal dysmotility with omission of primary peristaltic waves in the mid esophagus, and tertiary contractions in the esophagus Continue Protonix Continue dysphagia 1 diet X-ray abdomen showed some gaseous distention.  For now patient is able to tolerate oral diet will monitor.   Essential hypertension Continue metoprolol   Mixed hyperlipidemia Continue Lipitor   Schizophrenia Continue Risperdal   Depression Continue Cymbalta   Anxiety. We will add Klonopin.  Patient tells me that the anxiety medication is not making any improvement although does not appear to be severely anxious.  Subjective: Continues to complain about her anxiety continues to complain about her abdomen and nausea although no vomiting seen.  Tolerating oral diet.  Physical Exam: Vitals:   11/14/21 1100 11/14/21 1200 11/14/21 1300 11/14/21 1400  BP: (!) 123/57 (!) 123/59 125/62 120/71  Pulse: (!) 116 (!) 106 (!) 131 (!) 116  Resp: (!) 25 (!) 21 (!) 22 (!) 23  Temp:      TempSrc:      SpO2: 97% 94% 94% 95%  Weight:      Height:       General: Appear in moderate distress; no visible Abnormal Neck Mass Or lumps, Conjunctiva  normal Cardiovascular: S1 and S2 Present, aortic systolic  Murmur, Respiratory: good respiratory effort, Bilateral Air entry present and CTA, no  Crackles, no wheezes Abdomen: Bowel Sound present, Non tender  Extremities: no Pedal edema Neurology: alert and oriented to self  Gait not checked due to patient safety concerns   Data Reviewed: I have Reviewed nursing notes, Vitals, and Lab results since pt's last encounter. Pertinent lab results CBC and CMP I have ordered test including BC and CMP I have discussed pt's care plan and test results with cardiology.   Family Communication: No one at bedside  Disposition: Status is: Inpatient Remains inpatient appropriate because: Requiring IV amiodarone therapy for rate control  SCDs Start: 11/11/21 1340 Place TED hose Start: 11/11/21 1340 SCDs Start: 11/09/21 2113 apixaban (ELIQUIS) tablet 5 mg   Level of care: Stepdown Continue in stepdown due to IV amiodarone.  Author: Berle Mull, MD 11/14/2021 4:31 PM Please look on [email or web address] to find out who is on call.

## 2021-11-14 NOTE — Telephone Encounter (Signed)
Please advise about post op staple/suture removal - facility, [facility], [place], [place] [phone], did not know whether this would be done at facility. We have scheduled the 28 day postop visit until further advised.

## 2021-11-14 NOTE — Telephone Encounter (Signed)
Left detailed message This is the number for medical records, hope that is correct.

## 2021-11-14 NOTE — Telephone Encounter (Signed)
Staples out in 14 days per Operative note, I called facility and it rang and rang and rang no answer.

## 2021-11-14 NOTE — Inpatient Diabetes Management (Signed)
Inpatient Diabetes Program Recommendations  AACE/ADA: New Consensus Statement on Inpatient Glycemic Control (2015)  Target Ranges:  Prepandial:   less than 140 mg/dL      Peak postprandial:   less than 180 mg/dL (1-2 hours)      Critically ill patients:  140 - 180 mg/dL   Lab Results  Component Value Date   GLUCAP 156 (H) 11/14/2021   HGBA1C 6.6 (H) 11/09/2021    Review of Glycemic Control   Latest Reference Range & Units 11/13/21 08:17 11/13/21 12:07 11/13/21 16:26 11/13/21 20:00 11/13/21 21:27 11/14/21 07:51  Glucose-Capillary 70 - 99 mg/dL 164 (H) 190 (H) 163 (H) 237 (H) 229 (H) 156 (H)   Diabetes history: DM 2 Outpatient Diabetes medications: Levemir 22 units qhs, novolog 10-16 units tid, Tradjenta 5 mg Daily Current orders for Inpatient glycemic control:  Semglee 10 units Daily Novolog 0-9 units tid + hs  A1c 6.6% on 10/18 Ensure Enlive bid between meals  Inpatient Diabetes Program Recommendations:    -  consider restarting home Tradjenta 5 mg Daily  Thanks, Tama Headings RN, MSN, BC-ADM Inpatient Diabetes Coordinator Team Pager [phone] (8a-5p)

## 2021-11-14 NOTE — Progress Notes (Signed)
  Amiodarone Drug - Drug Interaction Consult Note  Recommendations: Watch for any muscle pain or weakness , patient on lipitor   Amiodarone is metabolized by the cytochrome P450 system and therefore has the potential to cause many drug interactions. Amiodarone has an average plasma half-life of 50 days (range 20 to 100 days).   There is potential for drug interactions to occur several weeks or months after stopping treatment and the onset of drug interactions may be slow after initiating amiodarone.   '[x]'$  Statins: Increased risk of myopathy. Simvastatin- restrict dose to '20mg'$  daily. Other statins: counsel patients to report any muscle pain or weakness immediately.  '[]'$  Anticoagulants: Amiodarone can increase anticoagulant effect. Consider warfarin dose reduction. Patients should be monitored closely and the dose of anticoagulant altered accordingly, remembering that amiodarone levels take several weeks to stabilize.  '[]'$  Antiepileptics: Amiodarone can increase plasma concentration of phenytoin, the dose should be reduced. Note that small changes in phenytoin dose can result in large changes in levels. Monitor patient and counsel on signs of toxicity.  '[]'$  Beta blockers: increased risk of bradycardia, AV block and myocardial depression. Sotalol - avoid concomitant use.  '[]'$   Calcium channel blockers (diltiazem and verapamil): increased risk of bradycardia, AV block and myocardial depression.  '[]'$   Cyclosporine: Amiodarone increases levels of cyclosporine. Reduced dose of cyclosporine is recommended.  '[]'$  Digoxin dose should be halved when amiodarone is started.  '[]'$  Diuretics: increased risk of cardiotoxicity if hypokalemia occurs.  '[]'$  Oral hypoglycemic agents (glyburide, glipizide, glimepiride): increased risk of hypoglycemia. Patient's glucose levels should be monitored closely when initiating amiodarone therapy.   '[]'$  Drugs that prolong the QT interval:  Torsades de pointes risk may be  increased with concurrent use - avoid if possible.  Monitor QTc, also keep magnesium/potassium WNL if concurrent therapy can't be avoided.  Antibiotics: e.g. fluoroquinolones, erythromycin.  Antiarrhythmics: e.g. quinidine, procainamide, disopyramide, sotalol.  Antipsychotics: e.g. phenothiazines, haloperidol.   Lithium, tricyclic antidepressants, and methadone. Thank You,  Isac Sarna L  11/14/2021 10:49 AM

## 2021-11-15 DIAGNOSIS — S72002A Fracture of unspecified part of neck of left femur, initial encounter for closed fracture: Secondary | ICD-10-CM | POA: Diagnosis not present

## 2021-11-15 DIAGNOSIS — Z515 Encounter for palliative care: Secondary | ICD-10-CM | POA: Diagnosis not present

## 2021-11-15 DIAGNOSIS — Z7189 Other specified counseling: Secondary | ICD-10-CM

## 2021-11-15 DIAGNOSIS — Z66 Do not resuscitate: Secondary | ICD-10-CM

## 2021-11-15 DIAGNOSIS — I4891 Unspecified atrial fibrillation: Secondary | ICD-10-CM | POA: Diagnosis not present

## 2021-11-15 DIAGNOSIS — S72002D Fracture of unspecified part of neck of left femur, subsequent encounter for closed fracture with routine healing: Secondary | ICD-10-CM | POA: Diagnosis not present

## 2021-11-15 LAB — CBC
HCT: 27.3 % — ABNORMAL LOW (ref 36.0–46.0)
Hemoglobin: 8.9 g/dL — ABNORMAL LOW (ref 12.0–15.0)
MCH: 28.3 pg (ref 26.0–34.0)
MCHC: 32.6 g/dL (ref 30.0–36.0)
MCV: 86.7 fL (ref 80.0–100.0)
Platelets: 173 10*3/uL (ref 150–400)
RBC: 3.15 MIL/uL — ABNORMAL LOW (ref 3.87–5.11)
RDW: 14.7 % (ref 11.5–15.5)
WBC: 5.7 10*3/uL (ref 4.0–10.5)
nRBC: 0 % (ref 0.0–0.2)

## 2021-11-15 LAB — COMPREHENSIVE METABOLIC PANEL
ALT: 12 U/L (ref 0–44)
AST: 15 U/L (ref 15–41)
Albumin: 2.2 g/dL — ABNORMAL LOW (ref 3.5–5.0)
Alkaline Phosphatase: 73 U/L (ref 38–126)
Anion gap: 6 (ref 5–15)
BUN: 11 mg/dL (ref 8–23)
CO2: 31 mmol/L (ref 22–32)
Calcium: 8.1 mg/dL — ABNORMAL LOW (ref 8.9–10.3)
Chloride: 103 mmol/L (ref 98–111)
Creatinine, Ser: 0.43 mg/dL — ABNORMAL LOW (ref 0.44–1.00)
GFR, Estimated: >60 mL/min (ref >60)
Glucose, Bld: 222 mg/dL — ABNORMAL HIGH (ref 70–99)
Potassium: 4 mmol/L (ref 3.5–5.1)
Sodium: 140 mmol/L (ref 135–145)
Total Bilirubin: 0.3 mg/dL (ref 0.3–1.2)
Total Protein: 5.3 g/dL — ABNORMAL LOW (ref 6.5–8.1)

## 2021-11-15 LAB — GLUCOSE, CAPILLARY
Glucose-Capillary: 205 mg/dL — ABNORMAL HIGH (ref 70–99)
Glucose-Capillary: 185 mg/dL — ABNORMAL HIGH (ref 70–99)
Glucose-Capillary: 204 mg/dL — ABNORMAL HIGH (ref 70–99)
Glucose-Capillary: 151 mg/dL — ABNORMAL HIGH (ref 70–99)
Glucose-Capillary: 177 mg/dL — ABNORMAL HIGH (ref 70–99)

## 2021-11-15 LAB — MAGNESIUM: Magnesium: 1.9 mg/dL (ref 1.7–2.4)

## 2021-11-15 MED ORDER — AMIODARONE IV BOLUS ONLY 150 MG/100ML
INTRAVENOUS | Status: AC
  Filled 2021-11-15: qty 100

## 2021-11-15 MED ORDER — INSULIN ASPART 100 UNIT/ML IJ SOLN: 0.0000 [IU] | Freq: Every day | INTRAMUSCULAR | Status: DC

## 2021-11-15 MED ORDER — INSULIN ASPART 100 UNIT/ML IJ SOLN
0.0000 [IU] | Freq: Three times a day (TID) | INTRAMUSCULAR | Status: DC
  Administered 2021-11-15: 3 [IU] via SUBCUTANEOUS
  Administered 2021-11-15: 5 [IU] via SUBCUTANEOUS
  Administered 2021-11-15 – 2021-11-16 (×2): 3 [IU] via SUBCUTANEOUS
  Administered 2021-11-16: 11 [IU] via SUBCUTANEOUS

## 2021-11-15 MED ORDER — INSULIN GLARGINE-YFGN 100 UNIT/ML ~~LOC~~ SOLN
15.0000 [IU] | Freq: Every day | SUBCUTANEOUS | Status: DC
  Administered 2021-11-15: 15 [IU] via SUBCUTANEOUS
  Filled 2021-11-15 (×2): qty 0.15

## 2021-11-15 NOTE — Progress Notes (Signed)
Patient ID: [name], female   DOB: [birth year], 76 y.o.   MRN: [id number]   Reminder     Full weightbearing no restrictions Staples out in 14 days Follow up at 28 days DVT prophylaxis for 35 days  I ll be out for several days   Call [phone] for questions after today

## 2021-11-15 NOTE — Progress Notes (Signed)
Progress Note Patient: [name] ZOX:[id number] DOB: December 08, 1945 DOA: 11/09/2021  DOS: the patient was seen and examined on 11/15/2021  Brief hospital course: PMH of HTN, DM, chronic A-fib on Eliquis, schizophrenia.  Present to the hospital with complaints of a mechanical fall.  Found to have left femoral neck fracture.  Orthopedic consulted.  Cardiology consult for preop clearance due to history of HOCM.  Patient tolerated surgery well.  Now monitoring for postop recovery. Due to RVR patient is currently being transferred to stepdown unit for amiodarone infusion. Assessment and Plan: Closed left femoral neck fracture Presents with mechanical fall. Left hip x-ray showed acute fracture of the left femoral neck Continue pain control. Orthopedic surgeon Dr. Aline Brochure. Underwent left hip arthroplasty Monitor for postop recovery.  On Eliquis for DVT prophylaxis.  Acute postop expected blood loss anemia. Hemoglobin dropped from 10-7.5. Blood pressure also hypotensive. Transfuse 1 PRBC on 10/22. Repeat hemoglobin actually is adequately elevated.  And remained stable. Tolerating Eliquis. Consent received from legal guardian on call representative.   Chronic atrial fibrillation with RVR Patient was started on amiodarone drip.  Heart rate improved on the drip but worsened again. Cardiology was reconsulted. Started on Lopressor as well.  But due to persistent tachycardia back on amiodarone drip. If requires per cardiology patient can receive digoxin load. Continue in stepdown unit. Continue Eliquis.   Prolonged QT interval Avoid QT prolonging drugs Continue telemetry   Type 2 diabetes mellitus, uncontrolled with hyperglycemia with long-term insulin use. Continue Semglee. Continue ISS and hypoglycemia protocol   GERD Continue Protonix   Chronic diastolic CHF HOCM Patient appears euvolemic clinically Echocardiogram done in August of this year showed LVEF of 65 to 70%, no RWMA,  +HOCM Continue total input/output, daily weights and fluid restriction Cardiology was consulted for preop clearance.  Patient tolerated procedure well.  [place] cardiology consultation. Avoid hypotension avoid dehydration.   Stricture and stenosis of esophagus EGD done on 07/23/2021 showed  food in the middle third and lower third of the esophagus.   A distal esophageal stricture was noted. Barium swallow done on 07/25/2021 showed nonspecific esophageal dysmotility with omission of primary peristaltic waves in the mid esophagus, and tertiary contractions in the esophagus Continue Protonix Continue dysphagia 1 diet X-ray abdomen showed some gaseous distention.  For now patient is able to tolerate oral diet will monitor.   Essential hypertension Continue metoprolol   Mixed hyperlipidemia Continue Lipitor   Schizophrenia Continue Risperdal   Depression Continue Cymbalta   Anxiety. We will add Klonopin.  Patient tells me that the anxiety medication is not making any improvement although does not appear to be severely anxious.  Dysphagia. Patient is on dysphagia 1 diet.  Continue.  Speech therapy following.  Subjective: Continues to complain of anxiety medication not working, continues to complain of abdominal pain continues to complain of nausea no vomiting.  Patient was sleeping comfortably prior to my arrival.  Physical Exam: Vitals:   11/15/21 1600 11/15/21 1641 11/15/21 1700 11/15/21 1800  BP: (!) 119/56  (!) 119/55 (!) 104/54  Pulse: 70 88 86 77  Resp: 18 (!) 22 (!) 22 (!) 21  Temp:  97.9 F (36.6 C)    TempSrc:  Oral    SpO2: 100% 98% 98% 99%  Weight:      Height:       General: Appear in mild distress; no visible Abnormal Neck Mass Or lumps, Conjunctiva normal Cardiovascular: S1 and S2 Present, aortic systolic  Murmur, Respiratory: good respiratory effort, Bilateral  Air entry present and CTA, no Crackles, no wheezes Abdomen: Bowel Sound present, Non tender   Extremities: no Pedal edema Neurology: alert and oriented to self  Gait not checked due to patient safety concerns   Data Reviewed: I have Reviewed nursing notes, Vitals, and Lab results since pt's last encounter. Pertinent lab results CBC and BMP I have ordered test including CBC and BMP    Family Communication: No one at bedside.  Patient has a legal guardian.  Disposition: Status is: Inpatient Remains inpatient appropriate because: Need for IV amiodarone therapy.  SCDs Start: 11/11/21 1340 Place TED hose Start: 11/11/21 1340 SCDs Start: 11/09/21 2113 apixaban (ELIQUIS) tablet 5 mg   Level of care: Stepdown continue stepdown for IV amiodarone. Author: Berle Mull, MD 11/15/2021 6:35 PM Please look on [email or web address] to find out who is on call.

## 2021-11-15 NOTE — Consult Note (Signed)
Palliative Care Consult Note                                  Date: 11/15/2021   Patient Name: [name]  DOB: 1945/11/29  MRN: [id number]  Age / Sex: 76 y.o., female  PCP: Courtney Mc, NP Referring Physician: Lavina Hamman, MD  Reason for Consultation: {Reason for Consult:23484}  HPI/Patient Profile: 76 y.o. female  with past medical history of *** admitted on 11/09/2021 with ***.   Past Medical History:  Diagnosis Date   Anxiety    Arthritis    Atrial fibrillation (HCC)    Atrial flutter (HCC)    CHF (congestive heart failure) (HCC)    Collagen vascular disease ([place])    COVID-19 virus infection    COVID-19+ approx 01/24/19; asymptomatic course with full recovery   Dependence on wheelchair    pivot/transfers   Depression    History of psychosis and previous suicide attempt   DVT, lower extremity, recurrent ([place])    Long-term Coumadin per Dr. Legrand Rams   Essential hypertension    GERD (gastroesophageal reflux disease)    Hemiplegia ([place]) 2010   Left side   History of stroke    Acute infarct and right cerebral white matter small vessel disease 12/10   Hyperlipidemia    Hypotension, unspecified    IDA (iron deficiency anemia)    Irritable bowel syndrome without diarrhea    Leg DVT (deep venous thromboembolism), acute ([place]) 2006   Obstructive hypertrophic cardiomyopathy (HCC)    Orthostatic hypotension    Schizophrenia ([place])    Stroke ([place])    left sided weakness   Type 2 diabetes mellitus (HCC)    Vitamin D deficiency     Subjective:   This NP [name] reviewed medical records, received report from team, assessed the patient and then meet at the patient's bedside to discuss diagnosis, prognosis, GOC, EOL wishes disposition and options.  I met with ***.   Concept of Palliative Care was introduced as specialized medical care for people and their families living with serious illness.  If focuses on providing  relief from the symptoms and stress of a serious illness.  The goal is to improve quality of life for both the patient and the family. Values and goals of care important to patient and family were attempted to be elicited.  Created space and opportunity for patient  and family to explore thoughts and feelings regarding current medical situation   Natural trajectory and current clinical status were discussed. Questions and concerns addressed. Patient  encouraged to call with questions or concerns.    Patient/Family Understanding of Illness: ***  Life Review: ***  Patient Values: ***  Goals: ***  Today's Discussion: ***  Review of Systems  Objective:   Primary Diagnoses: Present on Admission:  Fracture of femoral neck, left, closed (HCC)  Atrial fibrillation with RVR (HCC)  (Resolved) Prolonged QT interval  Essential hypertension  Mixed hyperlipidemia  Schizophrenia (HCC)  GERD (gastroesophageal reflux disease)  Pressure injury of skin   Physical Exam  Vital Signs:  BP (!) 114/57   Pulse 96   Temp 98 F (36.7 C) (Oral)   Resp 17   Ht '5\' 5"'  (1.651 m)   Wt 62.6 kg   SpO2 99%   BMI 22.97 kg/m   Palliative Assessment/Data: ***    Advanced Care Planning:   Primary Decision Maker: {Primary Decision WOEHO:12248}  Code Status/Advance Care Planning: {Palliative Code status:23503}  A discussion was had today regarding advanced directives. Concepts specific to code status, artifical feeding and hydration, continued IV antibiotics and rehospitalization was had.  The difference between a aggressive medical intervention path and a palliative comfort care path for this patient at this time was had. ***The MOST form was introduced and discussed.***  Decisions/Changes to ACP: ***  Assessment & Plan:   Impression: ***  SUMMARY OF RECOMMENDATIONS   ***  Symptom Management:  ***  Prognosis:  {Palliative Care Prognosis:23504}  Discharge Planning:   {Palliative dispostion:23505}   Discussed with: ***    Thank you for allowing [place] to participate in the care of [name] PMT will continue to support holistically.  Time Total: ***  Greater than 50%  of this time was spent counseling and coordinating care related to the above assessment and plan.  Signed by: Courtney Field, NP Palliative Medicine Team  Team Phone # [phone] (Nights/Weekends)  11/15/2021, 2:22 PM

## 2021-11-15 NOTE — Inpatient Diabetes Management (Signed)
Inpatient Diabetes Program Recommendations  AACE/ADA: New Consensus Statement on Inpatient Glycemic Control (2015)  Target Ranges:  Prepandial:   less than 140 mg/dL      Peak postprandial:   less than 180 mg/dL (1-2 hours)      Critically ill patients:  140 - 180 mg/dL   Lab Results  Component Value Date   GLUCAP 204 (H) 11/15/2021   HGBA1C 6.6 (H) 11/09/2021    Review of Glycemic Control  Latest Reference Range & Units 11/14/21 07:51 11/14/21 11:53 11/14/21 16:44 11/14/21 20:09 11/14/21 21:52 11/15/21 04:37 11/15/21 07:41 11/15/21 11:13  Glucose-Capillary 70 - 99 mg/dL 156 (H) 225 (H) 278 (H) 234 (H) 208 (H) 205 (H) 185 (H) 204 (H)    Diabetes history: DM 2 Outpatient Diabetes medications: Levemir 22 units qhs, novolog 10-16 units tid, Tradjenta 5 mg Daily Current orders for Inpatient glycemic control:  Semglee 10 units Daily Novolog 0-9 units tid + hs  A1c 6.6% on 10/18 Ensure Enlive bid between meals  Inpatient Diabetes Program Recommendations:    -  consider restarting home Tradjenta 5 mg Daily  Thanks, Tama Headings RN, MSN, BC-ADM Inpatient Diabetes Coordinator Team Pager [phone] (8a-5p)

## 2021-11-15 NOTE — Progress Notes (Signed)
Speech Language Pathology Treatment: Dysphagia  Patient Details Name: [name] MRN: [id number] DOB: 04/11/1945 Today's Date: 11/15/2021 Time: 9643-8381 SLP Time Calculation (min) (ACUTE ONLY): 23 min  Assessment / Plan / Recommendation Clinical Impression  Pt seen for ongoing dysphagia intervention following clinical swallow evaluation on Sunday. Pt alert and cooperative. She self presented straw sips thin liquids and puree. No overt signs of reduced airway protection. SLP asked Pt if she consumed foods that require chewing at SNF and she responded with, "I had throat surgery" so eats pureed foods. Continue diet as ordered, D1/puree and thin liquids with aspiration and reflux precautions. Consider upgrading textures at SNF if ok from esophageal standpoint. SLP will sign off.     HPI HPI: [name] is a 76 y.o. female with medical history significant of hypertension, T2DM, A-fib on Eliquis, schizophrenia who presents to the emergency department from [facility] via EMS due to 2 falls today.  X-ray of the hip was done and patient was noted to have left femoral neck fracture.  EMS was activated and patient was sent to the ED for further evaluation and management.  Patient denies hitting her head when she fell, she denies chest pain, shortness of breath, fever, chills, nausea or vomiting. Pt underwent left hip arthroplasty on 11/11/21. Hemoglobin dropped from 9.7-8.2 postoperatively.  Acute postop expected blood loss anemia. EGD done on 07/23/2021 showed  food in the middle third and lower third of the esophagus.   A distal esophageal stricture was noted.  Barium swallow done on 07/25/2021 showed nonspecific esophageal dysmotility with omission of primary peristaltic waves in the mid esophagus, and tertiary contractions in the esophagus. BSE requested.      SLP Plan  All goals met;Discharge SLP treatment due to (comment)      Recommendations for follow up therapy are one  component of a multi-disciplinary discharge planning process, led by the attending physician.  Recommendations may be updated based on patient status, additional functional criteria and insurance authorization.    Recommendations  Diet recommendations: Dysphagia 1 (puree);Thin liquid Liquids provided via: Cup;Straw Medication Administration: Crushed with puree Supervision: Patient able to self feed;Intermittent supervision to cue for compensatory strategies Compensations: Slow rate;Small sips/bites Postural Changes and/or Swallow Maneuvers: Seated upright 90 degrees;Upright 30-60 min after meal                Oral Care Recommendations: Oral care BID;Staff/trained caregiver to provide oral care Follow Up Recommendations: Follow physician's recommendations for discharge plan and follow up therapies Assistance recommended at discharge: Frequent or constant Supervision/Assistance SLP Visit Diagnosis: Dysphagia, unspecified (R13.10) Plan: All goals met;Discharge SLP treatment due to (comment)         Thank you,  Genene Churn, [place]   [place], [place]  11/15/2021, 2:40 PM

## 2021-11-15 NOTE — Progress Notes (Signed)
Rounding Note    Patient Name: [name] Date of Encounter: 11/15/2021  [place] Cardiologist: Rozann Lesches, MD   Subjective   No complaints  Inpatient Medications    Scheduled Meds:  (feeding supplement) PROSource Plus  30 mL Oral BID BM   apixaban  5 mg Oral BID   atorvastatin  40 mg Oral QHS   busPIRone  7.5 mg Oral BID   Chlorhexidine Gluconate Cloth  6 each Topical Daily   docusate sodium  100 mg Oral BID   DULoxetine  60 mg Oral Daily   feeding supplement  237 mL Oral BID BM   insulin aspart  0-15 Units Subcutaneous TID WC   insulin aspart  0-5 Units Subcutaneous QHS   insulin glargine-yfgn  15 Units Subcutaneous QHS   lubiprostone  8 mcg Oral Q breakfast   metoprolol tartrate  25 mg Oral BID   midodrine  10 mg Oral TID   mirtazapine  30 mg Oral QHS   multivitamin with minerals  1 tablet Oral Daily   naloxegol oxalate  12.5 mg Oral Daily   oxybutynin  5 mg Oral Daily   pantoprazole  40 mg Oral BID AC   polyethylene glycol  17 g Oral Daily   risperiDONE  1 mg Oral 2 times per day   risperiDONE  2 mg Oral QHS   Continuous Infusions:  amiodarone 30 mg/hr (11/14/21 2050)   PRN Meds: acetaminophen **OR** acetaminophen, clonazePAM, HYDROmorphone (DILAUDID) injection, menthol-cetylpyridinium **OR** phenol, methocarbamol **OR** [DISCONTINUED] methocarbamol (ROBAXIN) IV, ondansetron **OR** ondansetron (ZOFRAN) IV, oxyCODONE   Vital Signs    Vitals:   11/15/21 0400 11/15/21 0432 11/15/21 0500 11/15/21 0600  BP: 118/63 (!) 115/54 (!) 119/50 (!) 114/57  Pulse: 92 (!) 109 98 96  Resp: (!) 25 (!) '23 17 17  '$ Temp:  97.7 F (36.5 C)    TempSrc:  Axillary    SpO2: 97% 98% 96% 99%  Weight:  62.6 kg    Height:  '5\' 5"'$  (1.651 m)      Intake/Output Summary (Last 24 hours) at 11/15/2021 0837 Last data filed at 11/15/2021 0435 Gross per 24 hour  Intake 776.52 ml  Output 2750 ml  Net -1973.48 ml      11/15/2021    4:32 AM 11/14/2021    5:00  AM 11/13/2021    3:02 PM  Last 3 Weights  Weight (lbs) 138 lb 0.1 oz 130 lb 15.3 oz 129 lb 3 oz  Weight (kg) 62.6 kg 59.4 kg 58.6 kg      Telemetry    Afib 70s to low 100s - Personally Reviewed  ECG    N/a - Personally Reviewed  Physical Exam   GEN: No acute distress.   Neck: No JVD Cardiac: irreg Respiratory: Clear to auscultation bilaterally. GI: Soft, nontender, non-distended  MS: No edema; No deformity. Neuro:  Nonfocal  Psych: Normal affect   Labs    High Sensitivity Troponin:  No results for input(s): "TROPONINIHS" in the last 720 hours.   Chemistry Recent Labs  Lab 11/09/21 2006 11/10/21 0426 11/11/21 0747 11/12/21 0724 11/13/21 0420 11/14/21 0411 11/15/21 0403  NA 137 136 135   < > 136 134* 140  K 3.8 3.9 3.7   < > 3.5 3.5 4.0  CL 104 105 102   < > 102 99 103  CO2 '27 26 26   '$ < > '27 27 31  '$ GLUCOSE 181* 190* 178*   < > 132* 185*  222*  BUN '12 11 10   '$ < > 10 7* 11  CREATININE 0.63 0.61 0.56   < > 0.49 0.44 0.43*  CALCIUM 8.3* 7.9* 8.0*   < > 7.7* 7.5* 8.1*  MG  --  1.6* 1.8  --   --   --  1.9  PROT 7.1 5.8*  --   --   --   --  5.3*  ALBUMIN 3.6 2.8*  --   --   --   --  2.2*  AST 18 15  --   --   --   --  15  ALT 12 10  --   --   --   --  12  ALKPHOS 108 81  --   --   --   --  73  BILITOT 1.0 0.6  --   --   --   --  0.3  GFRNONAA >60 >60 >60   < > >60 >60 >60  ANIONGAP '6 5 7   '$ < > '7 8 6   '$ < > = values in this interval not displayed.    Lipids No results for input(s): "CHOL", "TRIG", "HDL", "LABVLDL", "LDLCALC", "CHOLHDL" in the last 168 hours.  Hematology Recent Labs  Lab 11/13/21 1506 11/14/21 0411 11/15/21 0403  WBC 7.7 6.2 5.7  RBC 3.13* 3.05* 3.15*  HGB 8.8* 8.9* 8.9*  HCT 27.3* 26.6* 27.3*  MCV 87.2 87.2 86.7  MCH 28.1 29.2 28.3  MCHC 32.2 33.5 32.6  RDW 14.8 14.6 14.7  PLT 150 136* 173   Thyroid No results for input(s): "TSH", "FREET4" in the last 168 hours.  BNPNo results for input(s): "BNP", "PROBNP" in the last 168 hours.   DDimer No results for input(s): "DDIMER" in the last 168 hours.   Radiology    DG Abd Portable 1V  Result Date: 11/13/2021 CLINICAL DATA:  Constipation EXAM: PORTABLE ABDOMEN - 1 VIEW COMPARISON:  Abdominal radiograph dated 11/10/2021 FINDINGS: The patient is rotated to the right. Nonobstructive bowel gas pattern. Decreased gaseous distention of the stomach. No free air or pneumatosis. Moderate volume stool throughout the colon. Partially imaged left hip arthroplasty. No acute or substantial osseous abnormality. The sacrum and coccyx are partially obscured by overlying bowel contents. IMPRESSION: Nonobstructive bowel gas pattern. Decreased gaseous distention of the stomach. Moderate volume stool throughout the colon. Electronically Signed   By: Darrin Nipper M.D.   On: 11/13/2021 11:19    Cardiac Studies   Patient Profile     [name] is a 76 y.o. female with a hx of persistent atrial fibrillation/flutter, HOCM, HTN, HLD, IDDM, SVT, history of DVT and history of CVA who is being seen 11/11/2021 for the evaluation of preoperative cardiac clearance at the request of Dr. Posey Pronto.  Assessment & Plan    1.Peristent afib/aflutter - Historically afib has been difficult to control due to low bp's, she is on midodrine, lopressor, amio at home with amio being for additional rate control.  - in setting of hip fracture s/p surgery as well as postop anemia has had issues with afib with RVR she does have intermittent low bp's 90s/40s at times - currently on amio gtt, lopressor '25mg'$  bid. Rates have been 70s-low 100s - rates should improve later into postop period, continue current meds today. Likely transition to oral amio tomorrow. If recurrent RVR could consider adding digoxin. Bp would not tolerate higher av nodal agent at this time due to transient hypotension episodes.  - would plan  outpatient EP eval  2. HOCM - - Her LVOT gradient was at 37 by echocardiogram in 08/2021.  - continue outpatient  f/u  3. Orthostatic hypotension - on midodrine.    For questions or updates, please contact [employer] [facility] [employer] [facility] Please consult [email or web address] for contact info under        Signed, Carlyle Dolly, MD  11/15/2021, 8:37 AM

## 2021-11-16 DIAGNOSIS — S72002A Fracture of unspecified part of neck of left femur, initial encounter for closed fracture: Secondary | ICD-10-CM | POA: Diagnosis not present

## 2021-11-16 DIAGNOSIS — I10 Essential (primary) hypertension: Secondary | ICD-10-CM | POA: Diagnosis not present

## 2021-11-16 DIAGNOSIS — S72002D Fracture of unspecified part of neck of left femur, subsequent encounter for closed fracture with routine healing: Secondary | ICD-10-CM | POA: Diagnosis not present

## 2021-11-16 DIAGNOSIS — I4891 Unspecified atrial fibrillation: Secondary | ICD-10-CM | POA: Diagnosis not present

## 2021-11-16 DIAGNOSIS — I5032 Chronic diastolic (congestive) heart failure: Secondary | ICD-10-CM | POA: Diagnosis not present

## 2021-11-16 LAB — CBC
HCT: 27 % — ABNORMAL LOW (ref 36.0–46.0)
Hemoglobin: 8.8 g/dL — ABNORMAL LOW (ref 12.0–15.0)
MCH: 28.8 pg (ref 26.0–34.0)
MCHC: 32.6 g/dL (ref 30.0–36.0)
MCV: 88.2 fL (ref 80.0–100.0)
Platelets: 217 10*3/uL (ref 150–400)
RBC: 3.06 MIL/uL — ABNORMAL LOW (ref 3.87–5.11)
RDW: 14.5 % (ref 11.5–15.5)
WBC: 6.9 10*3/uL (ref 4.0–10.5)
nRBC: 0 % (ref 0.0–0.2)

## 2021-11-16 LAB — COMPREHENSIVE METABOLIC PANEL
ALT: 10 U/L (ref 0–44)
AST: 14 U/L — ABNORMAL LOW (ref 15–41)
Albumin: 2.1 g/dL — ABNORMAL LOW (ref 3.5–5.0)
Alkaline Phosphatase: 71 U/L (ref 38–126)
Anion gap: 8 (ref 5–15)
BUN: 14 mg/dL (ref 8–23)
CO2: 33 mmol/L — ABNORMAL HIGH (ref 22–32)
Calcium: 8.2 mg/dL — ABNORMAL LOW (ref 8.9–10.3)
Chloride: 98 mmol/L (ref 98–111)
Creatinine, Ser: 0.44 mg/dL (ref 0.44–1.00)
GFR, Estimated: >60 mL/min (ref >60)
Glucose, Bld: 155 mg/dL — ABNORMAL HIGH (ref 70–99)
Potassium: 3.9 mmol/L (ref 3.5–5.1)
Sodium: 139 mmol/L (ref 135–145)
Total Bilirubin: 0.1 mg/dL — ABNORMAL LOW (ref 0.3–1.2)
Total Protein: 5.2 g/dL — ABNORMAL LOW (ref 6.5–8.1)

## 2021-11-16 LAB — MAGNESIUM: Magnesium: 2 mg/dL (ref 1.7–2.4)

## 2021-11-16 LAB — GLUCOSE, CAPILLARY
Glucose-Capillary: 198 mg/dL — ABNORMAL HIGH (ref 70–99)
Glucose-Capillary: 314 mg/dL — ABNORMAL HIGH (ref 70–99)

## 2021-11-16 MED ORDER — NOVOLOG FLEXPEN 100 UNIT/ML ~~LOC~~ SOPN: 10.0000 [IU] | PEN_INJECTOR | Freq: Three times a day (TID) | SUBCUTANEOUS | Status: AC

## 2021-11-16 MED ORDER — LEVEMIR FLEXTOUCH 100 UNIT/ML ~~LOC~~ SOPN: 18.0000 [IU] | PEN_INJECTOR | Freq: Every day | SUBCUTANEOUS | 2 refills | Status: AC

## 2021-11-16 MED ORDER — SENNOSIDES 8.6 MG PO TABS: 1.0000 | ORAL_TABLET | Freq: Every evening | ORAL | Status: AC | PRN

## 2021-11-16 MED ORDER — AMIODARONE HCL 200 MG PO TABS: ORAL_TABLET | ORAL | Status: AC

## 2021-11-16 MED ORDER — AMIODARONE HCL 200 MG PO TABS
200.0000 mg | ORAL_TABLET | Freq: Two times a day (BID) | ORAL | Status: DC
  Administered 2021-11-16: 200 mg via ORAL
  Filled 2021-11-16: qty 1

## 2021-11-16 MED ORDER — CLONAZEPAM 0.5 MG PO TABS: 0.5000 mg | ORAL_TABLET | Freq: Three times a day (TID) | ORAL | 0 refills | Status: AC | PRN

## 2021-11-16 MED ORDER — HYDROCODONE-ACETAMINOPHEN 10-325 MG PO TABS: 1.0000 | ORAL_TABLET | Freq: Three times a day (TID) | ORAL | 0 refills | Status: AC | PRN

## 2021-11-16 NOTE — Discharge Summary (Signed)
Physician Discharge Summary  [name] XHB:[id number] DOB: 1945-03-01 DOA: 11/09/2021  PCP: Moss Mc, NP Orthopedics: Dr. Aline Brochure   Admit date: 11/09/2021 Discharge date: 11/16/2021  Admitted From: LTC SNF  Disposition: LTC SNF   Recommendations for Outpatient Follow-up:   ORTHOPEDIC INSTRUCTIONS Full weightbearing no restrictions Staples out in 14 days Follow up at 28 days with Dr. Aline Brochure DVT prophylaxis for 35 days   Discharge Condition: STABLE   CODE STATUS: DNR  DIET: dysphagia 1   Brief Hospitalization Summary: Please see all hospital notes, images, labs for full details of the hospitalization. PMH of HTN, DM, chronic A-fib on Eliquis, schizophrenia.  Present to the hospital with complaints of a mechanical fall.  Found to have left femoral neck fracture.  Orthopedic consulted.  Cardiology consult for preop clearance due to history of HOCM.  Patient tolerated surgery well.  Now monitoring for postop recovery. Due to RVR patient is currently being transferred to stepdown unit for amiodarone infusion.  Hospital Course by problem  Assessment and Plan: Closed left femoral neck fracture Presents with mechanical fall. Left hip x-ray showed acute fracture of the left femoral neck Continue pain control. Orthopedic surgeon Dr. Aline Brochure. Underwent left hip arthroplasty Monitor for postop recovery.  On Eliquis for DVT prophylaxis. PLEASE REMOVE STAPLES IN 14 DAYS PLEASE FOLLOW UP WITH DR. HARRISON IN 28 DAYS    Acute postop expected blood loss anemia. Hemoglobin dropped from 10-7.5. Transfused 1 PRBC on 10/22. Repeat hemoglobin actually is adequately elevated.  And remained stable. Tolerating Eliquis BID  Consent received from legal guardian on call representative.   Chronic atrial fibrillation/flutter with RVR Patient was started on amiodarone drip.  Heart rate improved on the drip but worsened again. Cardiology was reconsulted. Started on Lopressor as well.   But due to persistent tachycardia placed back on amiodarone drip. Seen by cardiology on 10/25 and ok to discharge today on amiodarone 200 mg BID x 2 weeks followed by 200 mg daily.   monitored in stepdown unit. Continue Eliquis.    Prolonged QT interval Avoid QT prolonging drugs Continue telemetry   Type 2 diabetes mellitus, uncontrolled with hyperglycemia with long-term insulin use. Continue Semglee. Continue ISS and hypoglycemia protocol   GERD Continue Protonix   Chronic diastolic CHF HOCM Patient appears euvolemic clinically Echocardiogram done in August of this year showed LVEF of 65 to 70%, no RWMA, +HOCM Continue total input/output, daily weights and fluid restriction Cardiology was consulted for preop clearance.  Patient tolerated procedure well.  [place] cardiology consultation. Avoid hypotension avoid dehydration.   Stricture and stenosis of esophagus EGD done on 07/23/2021 showed  food in the middle third and lower third of the esophagus.   A distal esophageal stricture was noted. Barium swallow done on 07/25/2021 showed nonspecific esophageal dysmotility with omission of primary peristaltic waves in the mid esophagus, and tertiary contractions in the esophagus Continue Protonix Continue dysphagia 1 diet   Essential hypertension Continue metoprolol   Mixed hyperlipidemia Continue Lipitor   Schizophrenia Continue Risperdal   Depression Continue Cymbalta   Anxiety. Clonazepam as needed    Dysphagia. Patient is on dysphagia 1 diet.  Continue.  Speech therapy following.  Discharge Diagnoses:  Principal Problem:   Fracture of femoral neck, left, closed ([place]) Active Problems:   Atrial fibrillation with RVR (HCC)   Schizophrenia (HCC)   Essential hypertension   Mixed hyperlipidemia   Stricture and stenosis of esophagus   GERD (gastroesophageal reflux disease)   Depression   Pressure injury  of skin   Chronic diastolic CHF (congestive heart failure)  [facility])   Discharge Instructions:  Allergies as of 11/16/2021       Reactions   Sulfa Antibiotics Rash        Medication List     STOP taking these medications    Abilify 2 MG tablet Generic drug: ARIPiprazole       TAKE these medications    acetaminophen 325 MG tablet Commonly known as: TYLENOL Take 650 mg by mouth 3 (three) times daily as needed for mild pain or moderate pain.   albuterol 108 (90 Base) MCG/ACT inhaler Commonly known as: VENTOLIN HFA Inhale 2 puffs into the lungs every 6 (six) hours as needed for wheezing or shortness of breath.   amiodarone 200 MG tablet Commonly known as: PACERONE 1 po BID x 14 days, then 1 po daily What changed:  how much to take how to take this when to take this additional instructions   apixaban 5 MG Tabs tablet Commonly known as: ELIQUIS Take 1 tablet (5 mg total) by mouth 2 (two) times daily.   atorvastatin 40 MG tablet Commonly known as: LIPITOR Take 1 tablet by mouth at bedtime.   BAZA PROTECT EX Apply 1 Application topically in the morning and at bedtime.   busPIRone 7.5 MG tablet Commonly known as: BUSPAR Take 7.5 mg by mouth 2 (two) times daily.   calcium carbonate 500 MG chewable tablet Commonly known as: TUMS - dosed in mg elemental calcium Chew 1 tablet by mouth 4 (four) times daily as needed for heartburn.   cholecalciferol 25 MCG (1000 UNIT) tablet Commonly known as: VITAMIN D3 Take 2,000 Units by mouth daily.   clonazePAM 0.5 MG tablet Commonly known as: KLONOPIN Take 1 tablet (0.5 mg total) by mouth 3 (three) times daily as needed for anxiety.   DULoxetine 60 MG capsule Commonly known as: CYMBALTA Take 1 capsule by mouth daily.   HYDROcodone-acetaminophen 10-325 MG tablet Commonly known as: NORCO Take 1 tablet by mouth every 8 (eight) hours as needed for up to 3 days for severe pain or moderate pain. What changed: reasons to take this   latanoprost 0.005 % ophthalmic solution Commonly  known as: XALATAN Place 1 drop into both eyes at bedtime.   Levemir FlexTouch 100 UNIT/ML FlexPen Generic drug: insulin detemir Inject 18 Units into the skin at bedtime. What changed: See the new instructions.   lubiprostone 8 MCG capsule Commonly known as: AMITIZA Take 1 capsule by mouth daily.   metoprolol tartrate 25 MG tablet Commonly known as: LOPRESSOR Take 12.5 mg by mouth 2 (two) times daily.   midodrine 10 MG tablet Commonly known as: PROAMATINE Take 10 mg by mouth 3 (three) times daily.   mirtazapine 30 MG tablet Commonly known as: REMERON Take 30 mg by mouth at bedtime.   multivitamin tablet Take 1 tablet by mouth daily.   naloxegol oxalate 12.5 MG Tabs tablet Commonly known as: MOVANTIK Take 1 tablet (12.5 mg total) by mouth daily.   NovoLOG FlexPen 100 UNIT/ML FlexPen Generic drug: insulin aspart Inject 10-16 Units into the skin 3 (three) times daily with meals. INJECT SUBCUTANEOUSLY AS FOLLOWS WITH MEALS: 90-150=10u: 151-200=11u: 201-250=12u: 251-300=13u: 301-350=14u: 351-400=15u: BS>400=16u & CALL MD. What changed: See the new instructions.   ondansetron 4 MG tablet Commonly known as: ZOFRAN Take 4 mg by mouth 2 (two) times daily.   oxybutynin 5 MG 24 hr tablet Commonly known as: DITROPAN-XL Take 5 mg by mouth daily.  pantoprazole 40 MG tablet Commonly known as: PROTONIX Take 1 tablet (40 mg total) by mouth 2 (two) times daily before a meal.   polyethylene glycol 17 g packet Commonly known as: MIRALAX / GLYCOLAX Take 17 g by mouth daily.   risperiDONE 1 MG tablet Commonly known as: RISPERDAL Take 1 mg by mouth 2 (two) times daily. Take one tablet at 900am and 1 tablet at 5pm and '2mg'$  at bedtime   risperiDONE 2 MG tablet Commonly known as: RISPERDAL Take 2 mg by mouth at bedtime. Take '2mg'$  at bedtime and '1mg'$  at 9am and 5p daily   senna 8.6 MG tablet Commonly known as: SENOKOT Take 1 tablet (8.6 mg total) by mouth at bedtime as needed for  constipation. What changed:  when to take this reasons to take this   Tradjenta 5 MG Tabs tablet Generic drug: linagliptin TAKE (1) TABLET BY MOUTH ONCE DAILY. What changed: See the new instructions.        Contact information for follow-up providers     Carole Civil, MD Follow up on 12/09/2021.   Specialties: Orthopedic Surgery, Radiology Why: For wound re-check Contact information: [address] [place] [address] [phone]              Contact information for after-discharge care     [facility] .   Service: Skilled Nursing Contact information: [place] [address] [phone]                    Allergies  Allergen Reactions   Sulfa Antibiotics Rash   Allergies as of 11/16/2021       Reactions   Sulfa Antibiotics Rash        Medication List     STOP taking these medications    Abilify 2 MG tablet Generic drug: ARIPiprazole       TAKE these medications    acetaminophen 325 MG tablet Commonly known as: TYLENOL Take 650 mg by mouth 3 (three) times daily as needed for mild pain or moderate pain.   albuterol 108 (90 Base) MCG/ACT inhaler Commonly known as: VENTOLIN HFA Inhale 2 puffs into the lungs every 6 (six) hours as needed for wheezing or shortness of breath.   amiodarone 200 MG tablet Commonly known as: PACERONE 1 po BID x 14 days, then 1 po daily What changed:  how much to take how to take this when to take this additional instructions   apixaban 5 MG Tabs tablet Commonly known as: ELIQUIS Take 1 tablet (5 mg total) by mouth 2 (two) times daily.   atorvastatin 40 MG tablet Commonly known as: LIPITOR Take 1 tablet by mouth at bedtime.   BAZA PROTECT EX Apply 1 Application topically in the morning and at bedtime.   busPIRone 7.5 MG tablet Commonly known as: BUSPAR Take 7.5 mg by mouth 2 (two) times daily.    calcium carbonate 500 MG chewable tablet Commonly known as: TUMS - dosed in mg elemental calcium Chew 1 tablet by mouth 4 (four) times daily as needed for heartburn.   cholecalciferol 25 MCG (1000 UNIT) tablet Commonly known as: VITAMIN D3 Take 2,000 Units by mouth daily.   clonazePAM 0.5 MG tablet Commonly known as: KLONOPIN Take 1 tablet (0.5 mg total) by mouth 3 (three) times daily as needed for anxiety.   DULoxetine 60 MG capsule Commonly known as: CYMBALTA Take 1 capsule by mouth daily.  HYDROcodone-acetaminophen 10-325 MG tablet Commonly known as: NORCO Take 1 tablet by mouth every 8 (eight) hours as needed for up to 3 days for severe pain or moderate pain. What changed: reasons to take this   latanoprost 0.005 % ophthalmic solution Commonly known as: XALATAN Place 1 drop into both eyes at bedtime.   Levemir FlexTouch 100 UNIT/ML FlexPen Generic drug: insulin detemir Inject 18 Units into the skin at bedtime. What changed: See the new instructions.   lubiprostone 8 MCG capsule Commonly known as: AMITIZA Take 1 capsule by mouth daily.   metoprolol tartrate 25 MG tablet Commonly known as: LOPRESSOR Take 12.5 mg by mouth 2 (two) times daily.   midodrine 10 MG tablet Commonly known as: PROAMATINE Take 10 mg by mouth 3 (three) times daily.   mirtazapine 30 MG tablet Commonly known as: REMERON Take 30 mg by mouth at bedtime.   multivitamin tablet Take 1 tablet by mouth daily.   naloxegol oxalate 12.5 MG Tabs tablet Commonly known as: MOVANTIK Take 1 tablet (12.5 mg total) by mouth daily.   NovoLOG FlexPen 100 UNIT/ML FlexPen Generic drug: insulin aspart Inject 10-16 Units into the skin 3 (three) times daily with meals. INJECT SUBCUTANEOUSLY AS FOLLOWS WITH MEALS: 90-150=10u: 151-200=11u: 201-250=12u: 251-300=13u: 301-350=14u: 351-400=15u: BS>400=16u & CALL MD. What changed: See the new instructions.   ondansetron 4 MG tablet Commonly known as: ZOFRAN Take  4 mg by mouth 2 (two) times daily.   oxybutynin 5 MG 24 hr tablet Commonly known as: DITROPAN-XL Take 5 mg by mouth daily.   pantoprazole 40 MG tablet Commonly known as: PROTONIX Take 1 tablet (40 mg total) by mouth 2 (two) times daily before a meal.   polyethylene glycol 17 g packet Commonly known as: MIRALAX / GLYCOLAX Take 17 g by mouth daily.   risperiDONE 1 MG tablet Commonly known as: RISPERDAL Take 1 mg by mouth 2 (two) times daily. Take one tablet at 900am and 1 tablet at 5pm and '2mg'$  at bedtime   risperiDONE 2 MG tablet Commonly known as: RISPERDAL Take 2 mg by mouth at bedtime. Take '2mg'$  at bedtime and '1mg'$  at 9am and 5p daily   senna 8.6 MG tablet Commonly known as: SENOKOT Take 1 tablet (8.6 mg total) by mouth at bedtime as needed for constipation. What changed:  when to take this reasons to take this   Tradjenta 5 MG Tabs tablet Generic drug: linagliptin TAKE (1) TABLET BY MOUTH ONCE DAILY. What changed: See the new instructions.        Procedures/Studies: DG Abd Portable 1V  Result Date: 11/13/2021 CLINICAL DATA:  Constipation EXAM: PORTABLE ABDOMEN - 1 VIEW COMPARISON:  Abdominal radiograph dated 11/10/2021 FINDINGS: The patient is rotated to the right. Nonobstructive bowel gas pattern. Decreased gaseous distention of the stomach. No free air or pneumatosis. Moderate volume stool throughout the colon. Partially imaged left hip arthroplasty. No acute or substantial osseous abnormality. The sacrum and coccyx are partially obscured by overlying bowel contents. IMPRESSION: Nonobstructive bowel gas pattern. Decreased gaseous distention of the stomach. Moderate volume stool throughout the colon. Electronically Signed   By: Darrin Nipper M.D.   On: 11/13/2021 11:19   Pelvis Portable  Result Date: 11/11/2021 CLINICAL DATA:  Postop. EXAM: PORTABLE PELVIS 1-2 VIEWS COMPARISON:  Preoperative radiograph. FINDINGS: Left hip arthroplasty in expected alignment. No  periprosthetic lucency or fracture. Recent postsurgical change includes air and edema in the soft tissues. Lateral skin staples in place. IMPRESSION: Left hip arthroplasty without immediate postoperative  complication. Electronically Signed   By: Keith Rake M.D.   On: 11/11/2021 13:15   DG Abd Portable 1V  Result Date: 11/10/2021 CLINICAL DATA:  Abdominal discomfort.  Hip fracture. EXAM: PORTABLE ABDOMEN - 1 VIEW COMPARISON:  CT of the abdomen and pelvis 08/13/2021 FINDINGS: Gaseous distention of the stomach is noted. Bowel gas pattern is otherwise normal. IMPRESSION: Gaseous distention of the stomach.  No obstruction Electronically Signed   By: San Morelle M.D.   On: 11/10/2021 13:54   CT Head Wo Contrast  Result Date: 11/09/2021 CLINICAL DATA:  Head trauma EXAM: CT HEAD WITHOUT CONTRAST TECHNIQUE: Contiguous axial images were obtained from the base of the skull through the vertex without intravenous contrast. RADIATION DOSE REDUCTION: This exam was performed according to the departmental dose-optimization program which includes automated exposure control, adjustment of the mA and/or kV according to patient size and/or use of iterative reconstruction technique. COMPARISON:  CT brain 10/10/2021, 09/21/2021 FINDINGS: Brain: No acute territorial infarction, hemorrhage, or intracranial mass. The ventricles are nonenlarged. Advanced chronic small vessel ischemic changes of the white matter. Vascular: No hyperdense vessels.  Carotid vascular calcification Skull: Normal. Negative for fracture or focal lesion. Sinuses/Orbits: Postsurgical changes of the maxillary and ethmoid sinuses. Other: Chronic anterior subluxation of the mandibular heads. IMPRESSION: 1. No CT evidence for acute intracranial abnormality. 2. Advanced chronic small vessel ischemic changes of the white matter. Electronically Signed   By: Donavan Foil M.D.   On: 11/09/2021 19:24   DG Chest Port 1 View  Result Date:  11/09/2021 CLINICAL DATA:  Fall pain in left hip EXAM: PORTABLE CHEST 1 VIEW COMPARISON:  10/10/2021 FINDINGS: Right CP angle is non included. No sizable effusion. Mild cardiomegaly. Mild diffuse chronic interstitial opacity. Aortic atherosclerosis. Suspect bilateral skin fold artifacts over pneumothoraces. IMPRESSION: 1. Cardiomegaly. 2. Bilateral skin fold artifacts are favored over pneumothoraces. Electronically Signed   By: Donavan Foil M.D.   On: 11/09/2021 18:45   DG Hip Unilat W or Wo Pelvis 2-3 Views Left  Result Date: 11/09/2021 CLINICAL DATA:  Fall.  Left hip pain EXAM: DG HIP (WITH OR WITHOUT PELVIS) 2-3V LEFT COMPARISON:  03/09/2021 FINDINGS: Diffuse osseous demineralization. Acute fracture of the left femoral neck with mild superolateral displacement and varus angulation. No dislocation. Pelvic bony ring appears intact. IMPRESSION: Acute fracture of the left femoral neck. Electronically Signed   By: Davina Poke D.O.   On: 11/09/2021 18:42     Subjective: Pt reports that she wants to discharge.  She has no specific complaints today.    Discharge Exam: Vitals:   11/16/21 1300 11/16/21 1400  BP: 121/67 (!) 107/56  Pulse: 89 (!) 126  Resp: (!) 33 (!) 27  Temp:    SpO2: 93% 97%   Vitals:   11/16/21 1122 11/16/21 1200 11/16/21 1300 11/16/21 1400  BP:  (!) 111/56 121/67 (!) 107/56  Pulse:  80 89 (!) 126  Resp:  (!) 23 (!) 33 (!) 27  Temp: 97.8 F (36.6 C)     TempSrc: Oral     SpO2:  93% 93% 97%  Weight:      Height:        General: Pt is alert, awake, not in acute distress Cardiovascular: irregularly irregular, S1/S2 +, no rubs, no gallops Respiratory: CTA bilaterally, no wheezing, no rhonchi Abdominal: Soft, NT, ND, bowel sounds + Extremities: bandages clean and dry.  no edema, no cyanosis   The results of significant diagnostics from this hospitalization (including imaging, microbiology,  ancillary and laboratory) are listed below for reference.      Microbiology: Recent Results (from the past 240 hour(s))  MRSA Next Gen by PCR, Nasal     Status: None   Collection Time: 11/10/21  3:51 AM   Specimen: Nasal Mucosa; Nasal Swab  Result Value Ref Range Status   MRSA by PCR Next Gen NOT DETECTED NOT DETECTED Final    Comment: (NOTE) The GeneXpert MRSA Assay (FDA approved for NASAL specimens only), is one component of a comprehensive MRSA colonization surveillance program. It is not intended to diagnose MRSA infection nor to guide or monitor treatment for MRSA infections. Test performance is not FDA approved in patients less than 63 years old. Performed at [facility], [address] [place], [place] [address]      Labs: BNP (last 3 results) Recent Labs    05/18/21 0923 08/13/21 1059 09/13/21 2158  BNP 348.0* 297.0* 503.5*   Basic Metabolic Panel: Recent Labs  Lab 11/10/21 0426 11/11/21 0747 11/12/21 0724 11/13/21 0420 11/14/21 0411 11/15/21 0403 11/16/21 0340  NA 136 135 137 136 134* 140 139  K 3.9 3.7 3.7 3.5 3.5 4.0 3.9  CL 105 102 105 102 99 103 98  CO2 '26 26 28 27 27 31 '$ 33*  GLUCOSE 190* 178* 164* 132* 185* 222* 155*  BUN '11 10 9 10 '$ 7* 11 14  CREATININE 0.61 0.56 0.48 0.49 0.44 0.43* 0.44  CALCIUM 7.9* 8.0* 8.1* 7.7* 7.5* 8.1* 8.2*  MG 1.6* 1.8  --   --   --  1.9 2.0  PHOS 3.6  --   --   --   --   --   --    Liver Function Tests: Recent Labs  Lab 11/09/21 2006 11/10/21 0426 11/15/21 0403 11/16/21 0340  AST '18 15 15 '$ 14*  ALT '12 10 12 10  '$ ALKPHOS 108 81 73 71  BILITOT 1.0 0.6 0.3 0.1*  PROT 7.1 5.8* 5.3* 5.2*  ALBUMIN 3.6 2.8* 2.2* 2.1*   No results for input(s): "LIPASE", "AMYLASE" in the last 168 hours. No results for input(s): "AMMONIA" in the last 168 hours. CBC: Recent Labs  Lab 11/09/21 1737 11/10/21 0426 11/11/21 0747 11/12/21 0724 11/13/21 0420 11/13/21 1506 11/14/21 0411 11/15/21 0403 11/16/21 0340  WBC 10.6*   < > 7.7   < > 7.9 7.7 6.2 5.7 6.9  NEUTROABS 9.0*  --  6.1  --    --  5.4  --   --   --   HGB 11.7*   < > 9.7*   < > 7.5* 8.8* 8.9* 8.9* 8.8*  HCT 35.1*   < > 29.7*   < > 23.1* 27.3* 26.6* 27.3* 27.0*  MCV 85.8   < > 86.1   < > 87.5 87.2 87.2 86.7 88.2  PLT 172   < > 164   < > 159 150 136* 173 217   < > = values in this interval not displayed.   Cardiac Enzymes: No results for input(s): "CKTOTAL", "CKMB", "CKMBINDEX", "TROPONINI" in the last 168 hours. BNP: Invalid input(s): "POCBNP" CBG: Recent Labs  Lab 11/15/21 1113 11/15/21 1620 11/15/21 1953 11/16/21 0744 11/16/21 1121  GLUCAP 204* 151* 177* 198* 314*   D-Dimer No results for input(s): "DDIMER" in the last 72 hours. Hgb A1c No results for input(s): "HGBA1C" in the last 72 hours. Lipid Profile No results for input(s): "CHOL", "HDL", "LDLCALC", "TRIG", "CHOLHDL", "LDLDIRECT" in the last 72 hours. Thyroid function studies No results for  input(s): "TSH", "T4TOTAL", "T3FREE", "THYROIDAB" in the last 72 hours.  Invalid input(s): "FREET3" Anemia work up No results for input(s): "VITAMINB12", "FOLATE", "FERRITIN", "TIBC", "IRON", "RETICCTPCT" in the last 72 hours. Urinalysis    Component Value Date/Time   COLORURINE YELLOW 11/09/2021 1432   APPEARANCEUR CLEAR 11/09/2021 1432   APPEARANCEUR Cloudy (A) 05/01/2016 1525   LABSPEC 1.012 11/09/2021 1432   PHURINE 6.0 11/09/2021 1432   GLUCOSEU NEGATIVE 11/09/2021 1432   HGBUR NEGATIVE 11/09/2021 1432   BILIRUBINUR NEGATIVE 11/09/2021 1432   BILIRUBINUR Negative 05/01/2016 1525   KETONESUR NEGATIVE 11/09/2021 1432   PROTEINUR NEGATIVE 11/09/2021 1432   UROBILINOGEN 0.2 09/14/2014 1703   NITRITE NEGATIVE 11/09/2021 1432   LEUKOCYTESUR TRACE (A) 11/09/2021 1432   Sepsis Labs Recent Labs  Lab 11/13/21 1506 11/14/21 0411 11/15/21 0403 11/16/21 0340  WBC 7.7 6.2 5.7 6.9   Microbiology Recent Results (from the past 240 hour(s))  MRSA Next Gen by PCR, Nasal     Status: None   Collection Time: 11/10/21  3:51 AM   Specimen: Nasal  Mucosa; Nasal Swab  Result Value Ref Range Status   MRSA by PCR Next Gen NOT DETECTED NOT DETECTED Final    Comment: (NOTE) The GeneXpert MRSA Assay (FDA approved for NASAL specimens only), is one component of a comprehensive MRSA colonization surveillance program. It is not intended to diagnose MRSA infection nor to guide or monitor treatment for MRSA infections. Test performance is not FDA approved in patients less than 76 years old. Performed at [facility], [address], [place],  [address]    Time coordinating discharge: 56 MINS   SIGNED:  Irwin Brakeman, MD  [facility] 11/16/2021, 3:00 PM How to contact the [facility] Attending or Consulting provider [place] or covering provider during after hours [place], for this patient?  Check the care team in [facility] and look for a) attending/consulting [facility] provider listed and b) the [facility] team listed Log into [email or web address] and use [facility]'s universal password to access. If you do not have the password, please contact the hospital operator. Locate the [facility] provider you are looking for under [facility] and page to a number that you can be directly reached. If you still have difficulty reaching the provider, please page the [facility] (Director on Call) for the Hospitalists listed on amion for assistance.

## 2021-11-16 NOTE — Progress Notes (Addendum)
Rounding Note    Patient Name: [name] Date of Encounter: 11/16/2021  [place] Cardiologist: Rozann Lesches, MD   Subjective   No new complaints, resting comfortably in bed.  Asking when she is going to go home.  No chest pain.  She does experience some back pain.  Inpatient Medications    Scheduled Meds:  (feeding supplement) PROSource Plus  30 mL Oral BID BM   apixaban  5 mg Oral BID   atorvastatin  40 mg Oral QHS   busPIRone  7.5 mg Oral BID   Chlorhexidine Gluconate Cloth  6 each Topical Daily   docusate sodium  100 mg Oral BID   DULoxetine  60 mg Oral Daily   feeding supplement  237 mL Oral BID BM   insulin aspart  0-15 Units Subcutaneous TID WC   insulin aspart  0-5 Units Subcutaneous QHS   insulin glargine-yfgn  15 Units Subcutaneous QHS   lubiprostone  8 mcg Oral Q breakfast   metoprolol tartrate  25 mg Oral BID   midodrine  10 mg Oral TID   mirtazapine  30 mg Oral QHS   multivitamin with minerals  1 tablet Oral Daily   naloxegol oxalate  12.5 mg Oral Daily   oxybutynin  5 mg Oral Daily   pantoprazole  40 mg Oral BID AC   polyethylene glycol  17 g Oral Daily   risperiDONE  1 mg Oral 2 times per day   risperiDONE  2 mg Oral QHS   Continuous Infusions:  amiodarone 30 mg/hr (11/16/21 1050)   PRN Meds: acetaminophen **OR** acetaminophen, clonazePAM, HYDROmorphone (DILAUDID) injection, menthol-cetylpyridinium **OR** phenol, methocarbamol **OR** [DISCONTINUED] methocarbamol (ROBAXIN) IV, ondansetron **OR** ondansetron (ZOFRAN) IV, oxyCODONE   Vital Signs    Vitals:   11/16/21 0800 11/16/21 0900 11/16/21 1000 11/16/21 1122  BP: 114/67 (!) 130/59 124/63   Pulse: 89 97 (!) 102   Resp: 16 (!) 27 (!) 21   Temp:    97.8 F (36.6 C)  TempSrc:    Oral  SpO2: 97% 97% 94%   Weight:      Height:        Intake/Output Summary (Last 24 hours) at 11/16/2021 1123 Last data filed at 11/16/2021 0600 Gross per 24 hour  Intake --  Output 1250 ml   Net -1250 ml      11/16/2021    5:00 AM 11/15/2021    4:32 AM 11/14/2021    5:00 AM  Last 3 Weights  Weight (lbs) 141 lb 1.5 oz 138 lb 0.1 oz 130 lb 15.3 oz  Weight (kg) 64 kg 62.6 kg 59.4 kg      Telemetry    Afib currently in the 70s- Personally Reviewed  ECG    N/a - Personally Reviewed  Physical Exam   GEN: No acute distress.  Edentulous Neck: No JVD Cardiac: Irregular, normal rate Respiratory: Clear to auscultation bilaterally. GI: Soft, nontender, non-distended  MS: No edema; No deformity. Neuro:  Nonfocal  Psych: Normal affect   Labs    High Sensitivity Troponin:  No results for input(s): "TROPONINIHS" in the last 720 hours.   Chemistry Recent Labs  Lab 11/10/21 0426 11/11/21 0747 11/12/21 0724 11/14/21 0411 11/15/21 0403 11/16/21 0340  NA 136 135   < > 134* 140 139  K 3.9 3.7   < > 3.5 4.0 3.9  CL 105 102   < > 99 103 98  CO2 26 26   < > 27  31 33*  GLUCOSE 190* 178*   < > 185* 222* 155*  BUN 11 10   < > 7* 11 14  CREATININE 0.61 0.56   < > 0.44 0.43* 0.44  CALCIUM 7.9* 8.0*   < > 7.5* 8.1* 8.2*  MG 1.6* 1.8  --   --  1.9 2.0  PROT 5.8*  --   --   --  5.3* 5.2*  ALBUMIN 2.8*  --   --   --  2.2* 2.1*  AST 15  --   --   --  15 14*  ALT 10  --   --   --  12 10  ALKPHOS 81  --   --   --  73 71  BILITOT 0.6  --   --   --  0.3 0.1*  GFRNONAA >60 >60   < > >60 >60 >60  ANIONGAP 5 7   < > '8 6 8   '$ < > = values in this interval not displayed.    Lipids No results for input(s): "CHOL", "TRIG", "HDL", "LABVLDL", "LDLCALC", "CHOLHDL" in the last 168 hours.  Hematology Recent Labs  Lab 11/14/21 0411 11/15/21 0403 11/16/21 0340  WBC 6.2 5.7 6.9  RBC 3.05* 3.15* 3.06*  HGB 8.9* 8.9* 8.8*  HCT 26.6* 27.3* 27.0*  MCV 87.2 86.7 88.2  MCH 29.2 28.3 28.8  MCHC 33.5 32.6 32.6  RDW 14.6 14.7 14.5  PLT 136* 173 217   Thyroid No results for input(s): "TSH", "FREET4" in the last 168 hours.  BNPNo results for input(s): "BNP", "PROBNP" in the last 168  hours.  DDimer No results for input(s): "DDIMER" in the last 168 hours.   Radiology    No results found.  Cardiac Studies   Patient Profile     TAHJA LIAO is a 76 y.o. female with a hx of persistent atrial fibrillation/flutter, HOCM, HTN, HLD, IDDM, SVT, history of DVT and history of CVA who is being seen 11/11/2021 for the evaluation of preoperative cardiac clearance at the request of Dr. Posey Pronto.  Assessment & Plan    Longstanding persistent atrial fibrillation/flutter -Has been challenging control her atrial fibrillation rate in the past secondary to hypotension.  She is on midodrine.  Amiodarone being utilized for additional rate control.   -We will go ahead and transition to oral amiodarone 200 mg twice daily.  After 2 weeks, decrease to 200 mg daily.  Has been on amiodarone IV drip. Historically afib has been difficult to control due to low bp's, she is on midodrine, lopressor, amio at home with amio being for additional rate control.  -Has had episodes of RVR in the setting of hip fracture as well as anemia. -Currently on amio gtt (transitioning to oral amiodarone), lopressor '25mg'$  bid. Rates have been 70s-low 100s  Hypertrophic obstructive cardiomyopathy - Gradient was 37 mm by echocardiogram in August. -Murmur heard on exam. -Avoid dehydration  Orthostatic hypotension - Continue with midodrine to help support blood pressures.  OK with DC from cardiac perspective  For questions or updates, please contact [place] Please consult [email or web address] for contact info under        Signed, Candee Furbish, MD  11/16/2021, 11:23 AM

## 2021-11-16 NOTE — NC FL2 (Signed)
[place] LEVEL OF CARE SCREENING TOOL     IDENTIFICATION  Patient Name: [name] Birthdate: [birth year] Sex: female Admission Date (Current Location): 11/09/2021  [place] and [place] Number:  [place] [facility] [id number] [place] and Address:  [facility] [address] [place]      Provider Number: [id number]  Attending Physician Name and Address:  Murlean Iba, MD  Relative Name and Phone Number:  [name] (Legal Guardian)   [phone]    Current Level of Care: Hospital Recommended Level of Care: [place] Prior Approval Number:    Date Approved/Denied:   PASRR Number:    Discharge Plan: SNF    Current Diagnoses: Patient Active Problem List   Diagnosis Date Noted   Fracture of femoral neck, left, closed ([place]) 11/09/2021   Chronic diastolic CHF (congestive heart failure) ([place]) 11/09/2021   Calculus of gallbladder without cholecystitis without obstruction    Septic Shock 10/10/2021   DNR (do not resuscitate) 10/10/2021   Long term current use of anticoagulant therapy 10/10/2021   Lactic acidosis 10/10/2021   Failure to thrive in adult 10/10/2021   HOCM (hypertrophic obstructive cardiomyopathy) ([place]) 09/19/2021   Pressure injury of skin 09/16/2021   Goals of care, counseling/discussion 09/16/2021   UTI (urinary tract infection) 09/14/2021   Septic shock ([place]) 09/13/2021   Pneumonia 08/14/2021   PNA (pneumonia) 08/13/2021   Opioid dependence ([place]) 07/24/2021   Depression 05/18/2021   Fecal impaction ([place]) 04/23/2021   Paroxysmal atrial fibrillation ([place]) 03/22/2021   SBO (small bowel obstruction) ([place]) 04/18/2020   Atrial fibrillation with RVR ([place]) 10/04/2019   GERD (gastroesophageal reflux disease) 08/26/2019   Constipation-opioid induced 09/09/2018   Hypotensive episode 03/21/2018   Primary osteoarthritis of right hip 04/18/2017   History of cerebrovascular accident (CVA) with residual  deficit 03/28/2017   Schizophrenia ([place]) 03/28/2017   Generalized weakness 03/28/2017   IBS (irritable bowel syndrome) 10/16/2016   Left hemiparesis ([place]) 10/06/2016   Chronic superficial gastritis without bleeding    Stricture and stenosis of esophagus    IDA (iron deficiency anemia) 04/06/2015   Mixed hyperlipidemia 11/06/2014   Sedentary lifestyle 11/06/2014   Uncontrolled type 2 diabetes mellitus with hyperglycemia, with long-term current use of insulin ([place]) 03/24/2010   Essential hypertension 03/24/2010   DVT, lower extremity, recurrent ([place]) 03/24/2010    Orientation RESPIRATION BLADDER Height & Weight     Self, Time, Situation, Place  Normal Continent Weight: 141 lb 1.5 oz (64 kg) Height:  '5\' 5"'$  (165.1 cm)  BEHAVIORAL SYMPTOMS/MOOD NEUROLOGICAL BOWEL NUTRITION STATUS      Continent Diet (DYS 1)  AMBULATORY STATUS COMMUNICATION OF NEEDS Skin   Limited Assist Verbally PU Stage and Appropriate Care, Surgical wounds (stage 2, left hip, surgical wound left hip)                       Personal Care Assistance Level of Assistance  Bathing, Feeding, Dressing Bathing Assistance: Limited assistance Feeding assistance: Independent Dressing Assistance: Limited assistance     Functional Limitations Info             SPECIAL CARE FACTORS FREQUENCY  PT (By licensed PT)     PT Frequency: 5x/week              Contractures Contractures Info: Not present    Additional Factors Info  Code Status, Allergies, Psychotropic Code Status Info: DNR Allergies Info: Sulfa Antibiotics Psychotropic Info: Buspar, Cymbalta, Risperdal, Remeron, Klonopin  Current Medications (11/16/2021):  This is the current hospital active medication list Current Facility-Administered Medications  Medication Dose Route Frequency Provider Last Rate Last Admin   (feeding supplement) PROSource Plus liquid 30 mL  30 mL Oral BID BM Carole Civil, MD   30 mL at 11/16/21 1430    acetaminophen (TYLENOL) tablet 650 mg  650 mg Oral Q6H PRN Carole Civil, MD   650 mg at 11/16/21 1013   Or   acetaminophen (TYLENOL) suppository 650 mg  650 mg Rectal Q6H PRN Carole Civil, MD       amiodarone (PACERONE) tablet 200 mg  200 mg Oral BID Jerline Pain, MD   200 mg at 11/16/21 1217   apixaban (ELIQUIS) tablet 5 mg  5 mg Oral BID Lavina Hamman, MD   5 mg at 11/16/21 0831   atorvastatin (LIPITOR) tablet 40 mg  40 mg Oral QHS Carole Civil, MD   40 mg at 11/15/21 2109   busPIRone (BUSPAR) tablet 7.5 mg  7.5 mg Oral BID Lavina Hamman, MD   7.5 mg at 11/16/21 8657   Chlorhexidine Gluconate Cloth 2 % PADS 6 each  6 each Topical Daily Carole Civil, MD   6 each at 11/15/21 1038   clonazePAM (KLONOPIN) tablet 0.5 mg  0.5 mg Oral TID PRN Carole Civil, MD   0.5 mg at 11/16/21 8469   docusate sodium (COLACE) capsule 100 mg  100 mg Oral BID Carole Civil, MD   100 mg at 11/16/21 0831   DULoxetine (CYMBALTA) DR capsule 60 mg  60 mg Oral Daily Carole Civil, MD   60 mg at 11/16/21 0831   feeding supplement (ENSURE ENLIVE / ENSURE PLUS) liquid 237 mL  237 mL Oral BID BM Carole Civil, MD   237 mL at 11/16/21 1015   HYDROmorphone (DILAUDID) injection 0.5 mg  0.5 mg Intravenous Q4H PRN Carole Civil, MD   0.5 mg at 11/15/21 1416   insulin aspart (novoLOG) injection 0-15 Units  0-15 Units Subcutaneous TID WC Lavina Hamman, MD   11 Units at 11/16/21 1218   insulin aspart (novoLOG) injection 0-5 Units  0-5 Units Subcutaneous QHS Lavina Hamman, MD       insulin glargine-yfgn [facility]) injection 15 Units  15 Units Subcutaneous QHS Lavina Hamman, MD   15 Units at 11/15/21 2117   lubiprostone (AMITIZA) capsule 8 mcg  8 mcg Oral Q breakfast Lavina Hamman, MD   8 mcg at 11/16/21 6295   menthol-cetylpyridinium (CEPACOL) lozenge 3 mg  1 lozenge Oral PRN Carole Civil, MD       Or   phenol (CHLORASEPTIC) mouth spray 1 spray  1 spray  Mouth/Throat PRN Carole Civil, MD       methocarbamol (ROBAXIN) tablet 500 mg  500 mg Oral Q6H PRN Carole Civil, MD   500 mg at 11/16/21 1431   metoprolol tartrate (LOPRESSOR) tablet 25 mg  25 mg Oral BID Lavina Hamman, MD   25 mg at 11/16/21 1013   midodrine (PROAMATINE) tablet 10 mg  10 mg Oral TID Carole Civil, MD   10 mg at 11/16/21 0830   mirtazapine (REMERON) tablet 30 mg  30 mg Oral QHS Lavina Hamman, MD   30 mg at 11/15/21 2108   multivitamin with minerals tablet 1 tablet  1 tablet Oral Daily Carole Civil, MD   1 tablet at 11/16/21 [id number]  naloxegol oxalate (MOVANTIK) tablet 12.5 mg  12.5 mg Oral Daily Lavina Hamman, MD   12.5 mg at 11/16/21 0515   ondansetron (ZOFRAN) tablet 4 mg  4 mg Oral Q6H PRN Carole Civil, MD   4 mg at 11/12/21 1100   Or   ondansetron (ZOFRAN) injection 4 mg  4 mg Intravenous Q6H PRN Carole Civil, MD   4 mg at 11/15/21 2202   oxybutynin (DITROPAN-XL) 24 hr tablet 5 mg  5 mg Oral Daily Lavina Hamman, MD   5 mg at 11/16/21 1610   oxyCODONE (Oxy IR/ROXICODONE) immediate release tablet 5 mg  5 mg Oral Q4H PRN Lavina Hamman, MD   5 mg at 11/16/21 1437   pantoprazole (PROTONIX) EC tablet 40 mg  40 mg Oral BID AC Carole Civil, MD   40 mg at 11/16/21 0830   polyethylene glycol (MIRALAX / GLYCOLAX) packet 17 g  17 g Oral Daily Lavina Hamman, MD   17 g at 11/16/21 0827   risperiDONE (RISPERDAL) tablet 1 mg  1 mg Oral 2 times per day Carole Civil, MD   1 mg at 11/16/21 9604   risperiDONE (RISPERDAL) tablet 2 mg  2 mg Oral QHS Carole Civil, MD   2 mg at 11/15/21 2108     Discharge Medications: Please see discharge summary for a list of discharge medications.  Relevant Imaging Results:  Relevant Lab Results:   Additional Information    Katrese Shell, Clydene Pugh, LCSW

## 2021-11-16 NOTE — TOC Transition Note (Signed)
Transition of Care [facility]) - CM/SW Discharge Note   Patient Details  Name: [name] MRN: [id number] Date of Birth: [birth year]  [facility]) CM/SW Contact:  [name], LCSW Phone Number: 11/16/2021, 2:50 PM   Clinical Narrative:    D/c clinicals sent to facility. [facility] to transport. TOC signing off.    Final next level of care: Skilled Nursing Facility Barriers to Discharge: Continued Medical Work up   Patient Goals and CMS Choice Patient states their goals for this hospitalization and ongoing recovery are:: return to facility      Discharge Placement                Patient to be transferred to facility by: [place] Name of family member notified: Message left for [name] Patient and family notified of of transfer: 11/16/21  Discharge Plan and Services     Post Acute Care Choice: Skilled Nursing Facility                               Social Determinants of Health (SDOH) Interventions     Readmission Risk Interventions    10/11/2021   11:15 AM 09/16/2021    2:02 PM 03/29/2021   10:51 AM  Readmission Risk Prevention Plan  Transportation Screening Complete Complete Complete  Medication Review [employer]) Complete Complete   HRI or Home Care Consult Complete Complete Complete  SW Recovery Care/Counseling Consult Complete  Complete  Palliative Care Screening Not Applicable Not Applicable Not Applicable  Skilled Nursing Facility Complete Not Applicable Complete

## 2021-11-16 NOTE — Care Management Important Message (Signed)
Important Message  Patient Details  Name: [name] MRN: [id number] Date of Birth: 08-Mar-1945   Medicare Important Message Given:  Yes (spoke with Ms. Pralle at [phone] to review letter, no additonal copy needed)     [name] 11/16/2021, 2:35 PM

## 2021-11-16 NOTE — Discharge Instructions (Addendum)
ORTHOPEDIC INSTRUCTIONS Full weightbearing no restrictions Staples out in 14 days Follow up at 28 days with Dr. Aline Brochure DVT prophylaxis for 35 days    IMPORTANT INFORMATION: PAY CLOSE ATTENTION   PHYSICIAN DISCHARGE INSTRUCTIONS  Follow with Primary care provider  Moss Mc, NP  and other consultants as instructed by your Hospitalist Physician  [place], WORSEN OR NEW PROBLEM DEVELOPS   Please note: You were cared for by a hospitalist during your hospital stay. Every effort will be made to forward records to your primary care provider.  You can request that your primary care provider send for your hospital records if they have not received them.  Once you are discharged, your primary care physician will handle any further medical issues. Please note that NO REFILLS for any discharge medications will be authorized once you are discharged, as it is imperative that you return to your primary care physician (or establish a relationship with a primary care physician if you do not have one) for your post hospital discharge needs so that they can reassess your need for medications and monitor your lab values.  Please get a complete blood count and chemistry panel checked by your Primary MD at your next visit, and again as instructed by your Primary MD.  Get Medicines reviewed and adjusted: Please take all your medications with you for your next visit with your Primary MD  Laboratory/radiological data: Please request your Primary MD to go over all hospital tests and procedure/radiological results at the follow up, please ask your primary care provider to get all Hospital records sent to his/her office.  In some cases, they will be blood work, cultures and biopsy results pending at the time of your discharge. Please request that your primary care provider follow up on these results.  If you are diabetic, please bring your blood sugar  readings with you to your follow up appointment with primary care.    Please call and make your follow up appointments as soon as possible.    Also Note the following: If you experience worsening of your admission symptoms, develop shortness of breath, life threatening emergency, suicidal or homicidal thoughts you must seek medical attention immediately by calling 911 or calling your MD immediately  if symptoms less severe.  You must read complete instructions/literature along with all the possible adverse reactions/side effects for all the Medicines you take and that have been prescribed to you. Take any new Medicines after you have completely understood and accpet all the possible adverse reactions/side effects.   Do not drive when taking Pain medications or sleeping medications (Benzodiazepines)  Do not take more than prescribed Pain, Sleep and Anxiety Medications. It is not advisable to combine anxiety,sleep and pain medications without talking with your primary care practitioner  Special Instructions: If you have smoked or chewed Tobacco  in the last 2 yrs please stop smoking, stop any regular Alcohol  and or any Recreational drug use.  Wear Seat belts while driving.  Do not drive if taking any narcotic, mind altering or controlled substances or recreational drugs or alcohol.

## 2021-11-17 ENCOUNTER — Encounter (HOSPITAL_COMMUNITY): Payer: Self-pay | Admitting: Orthopedic Surgery

## 2021-11-17 LAB — GLUCOSE, CAPILLARY: Glucose-Capillary: 150 mg/dL — ABNORMAL HIGH (ref 70–99)

## 2021-12-08 ENCOUNTER — Ambulatory Visit (INDEPENDENT_AMBULATORY_CARE_PROVIDER_SITE_OTHER): Payer: Medicare Other | Admitting: Orthopedic Surgery

## 2021-12-08 ENCOUNTER — Encounter: Payer: Self-pay | Admitting: Orthopedic Surgery

## 2021-12-08 DIAGNOSIS — I480 Paroxysmal atrial fibrillation: Secondary | ICD-10-CM

## 2021-12-08 DIAGNOSIS — Z96642 Presence of left artificial hip joint: Secondary | ICD-10-CM

## 2021-12-08 DIAGNOSIS — S72002A Fracture of unspecified part of neck of left femur, initial encounter for closed fracture: Secondary | ICD-10-CM

## 2021-12-08 DIAGNOSIS — G8194 Hemiplegia, unspecified affecting left nondominant side: Secondary | ICD-10-CM

## 2021-12-08 DIAGNOSIS — L89156 Pressure-induced deep tissue damage of sacral region: Secondary | ICD-10-CM

## 2021-12-08 NOTE — Progress Notes (Signed)
FOLLOW UP   Encounter Diagnoses  Name Primary?   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 bipolar prosthesis Yes    Chief Complaint  Patient presents with   Post-op Follow-up    LEFT hip 11/11/21    Postop visit #1 status post bipolar hip replacement  PROCEDURE:  Procedure(s): ARTHROPLASTY BIPOLAR HIP (HEMIARTHROPLASTY) (Left)   SURGEON:  Surgeon(s) and Role:    Carole Civil, MD - Primary   Implants [place] basic press-fit 6, 1.5 neck, 48 head   Findings complete femoral neck fracture left hip, acetabulum mild grade 1 arthritis  Postop plan Full weightbearing no restrictions Staples out in 14 days Follow up at 28 days DVT prophylaxis for 35 days  The person with her says she gets therapy but she is not walking  Her incision looks clean her leg alignment looks normal  I will see her in 2 months hopefully she will be up and standing at least by that time

## 2021-12-21 ENCOUNTER — Ambulatory Visit: Payer: Medicare Other | Admitting: Nurse Practitioner

## 2021-12-21 DIAGNOSIS — E041 Nontoxic single thyroid nodule: Secondary | ICD-10-CM

## 2021-12-21 DIAGNOSIS — E1159 Type 2 diabetes mellitus with other circulatory complications: Secondary | ICD-10-CM

## 2021-12-21 DIAGNOSIS — I959 Hypotension, unspecified: Secondary | ICD-10-CM

## 2021-12-21 DIAGNOSIS — I10 Essential (primary) hypertension: Secondary | ICD-10-CM

## 2021-12-21 DIAGNOSIS — E0590 Thyrotoxicosis, unspecified without thyrotoxic crisis or storm: Secondary | ICD-10-CM

## 2021-12-21 DIAGNOSIS — E782 Mixed hyperlipidemia: Secondary | ICD-10-CM

## 2021-12-22 ENCOUNTER — Encounter: Payer: Self-pay | Admitting: Cardiology

## 2021-12-22 ENCOUNTER — Ambulatory Visit: Payer: Medicare Other | Attending: Cardiology | Admitting: Cardiology

## 2021-12-22 VITALS — BP 110/70 | HR 85 | Ht 65.0 in | Wt 128.0 lb

## 2021-12-22 DIAGNOSIS — I4819 Other persistent atrial fibrillation: Secondary | ICD-10-CM | POA: Diagnosis present

## 2021-12-22 DIAGNOSIS — I951 Orthostatic hypotension: Secondary | ICD-10-CM | POA: Insufficient documentation

## 2021-12-22 DIAGNOSIS — E782 Mixed hyperlipidemia: Secondary | ICD-10-CM | POA: Diagnosis present

## 2021-12-22 DIAGNOSIS — I421 Obstructive hypertrophic cardiomyopathy: Secondary | ICD-10-CM | POA: Insufficient documentation

## 2021-12-22 NOTE — Progress Notes (Signed)
Cardiology Office Note  Date: 12/22/2021   ID: [name], DOB [birth year], MRN [id number]  PCP:  Moss Mc, NP  Cardiologist:  Rozann Lesches, MD Electrophysiologist:  None   Chief Complaint  Patient presents with   Cardiac follow-up    History of Present Illness: [name] is a 76 y.o. female last seen in May by Ms. Strader PA-C, I reviewed the note.  She is here today with an Environmental consultant from [place].  I reviewed her records. She had a recent left hip fracture and underwent left hip hemiarthroplasty with Dr. Aline Brochure.  Complains of residual hip discomfort and back pain.  No chest pain or palpitations.  Heart rate is better controlled today on examination, she was in rapid atrial fibrillation by ECG in the hospital.  I reviewed her current medications which are otherwise stable from a cardiac perspective.  I went over her lab work from October as well.  She also had an echocardiogram earlier this year in August which is resulted below.  Plan is conservative management at this point, I would not pursue a cardiac MRI.  Past Medical History:  Diagnosis Date   Anxiety    Arthritis    Atrial fibrillation (HCC)    Atrial flutter (HCC)    CHF (congestive heart failure) (HCC)    Collagen vascular disease ([place])    COVID-19 virus infection    COVID-19+ approx 01/24/19; asymptomatic course with full recovery   Dependence on wheelchair    pivot/transfers   Depression    History of psychosis and previous suicide attempt   DVT, lower extremity, recurrent ([place])    Long-term Coumadin per Dr. Legrand Rams   Essential hypertension    GERD (gastroesophageal reflux disease)    Hemiplegia ([place]) 2010   Left side   History of stroke    Acute infarct and right cerebral white matter small vessel disease 12/10   Hyperlipidemia    Hypotension, unspecified    IDA (iron deficiency anemia)    Irritable bowel syndrome without diarrhea    Leg DVT (deep venous thromboembolism), acute ([place])  2006   Obstructive hypertrophic cardiomyopathy (HCC)    Orthostatic hypotension    Schizophrenia ([place])    Stroke ([place])    left sided weakness   Type 2 diabetes mellitus ([place])    Vitamin D deficiency     Past Surgical History:  Procedure Laterality Date   BACK SURGERY     BIOPSY N/A 11/24/2014   Procedure: BIOPSY;  Surgeon: Danie Binder, MD;  Location: [facility];  Service: Endoscopy;  Laterality: N/A;   BIOPSY  05/17/2016   Procedure: BIOPSY;  Surgeon: Danie Binder, MD;  [facility];  Service: Endoscopy;;  gastric biopsy   COLONOSCOPY WITH PROPOFOL N/A 11/24/2014   Dr. Rudie Meyer polyps removed/moderate sized internal hemorrhoids, tubular adenomas. Next surveillance in 3 years   ESOPHAGEAL DILATION N/A 07/23/2021   Procedure: ESOPHAGEAL DILATION;  Surgeon: Eloise Harman, DO;  [facility];  Service: Endoscopy;  Laterality: N/A;   ESOPHAGOGASTRODUODENOSCOPY (EGD) WITH PROPOFOL N/A 11/24/2014   Dr. Clayburn Pert HH/patent stricture at the gastroesophageal junction, mild non-erosive gastritis, path negative for H.pylori or celiac sprue   ESOPHAGOGASTRODUODENOSCOPY (EGD) WITH PROPOFOL N/A 05/17/2016   Procedure: ESOPHAGOGASTRODUODENOSCOPY (EGD) WITH PROPOFOL;  Surgeon: Danie Binder, MD;  [facility];  Service: Endoscopy;  Laterality: N/A;  12:45pm   ESOPHAGOGASTRODUODENOSCOPY (EGD) WITH PROPOFOL N/A 07/23/2021   Procedure: ESOPHAGOGASTRODUODENOSCOPY (EGD) WITH PROPOFOL;  Surgeon: Abbey Chatters,  Elon Alas, DO;  [facility];  Service: Endoscopy;  Laterality: N/A;   GIVENS CAPSULE STUDY N/A 12/11/2014   MULTILPLE EROSION IN the stomach WITH ACTIVE OOZING. OCCASIONAL EROSIONS AND RARE ULCER SEEN IN PROXIMAL SMALL BOWEL . No masses or AVMs SEEN. NO OLD BLOOD OR FRESH BLOOD SEEN.    HIP ARTHROPLASTY Left 11/11/2021   Procedure: ARTHROPLASTY BIPOLAR HIP (HEMIARTHROPLASTY);  Surgeon: Carole Civil, MD;  Location: [facility];  Service: Orthopedics;  Laterality:  Left;   POLYPECTOMY N/A 11/24/2014   Procedure: POLYPECTOMY;  Surgeon: Danie Binder, MD;  [facility];  Service: Endoscopy;  Laterality: N/A;   SAVORY DILATION N/A 05/17/2016   Procedure: SAVORY DILATION;  Surgeon: Danie Binder, MD;  [facility];  Service: Endoscopy;  Laterality: N/A;    Current Outpatient Medications  Medication Sig Dispense Refill   acetaminophen (TYLENOL) 325 MG tablet Take 650 mg by mouth 3 (three) times daily as needed for mild pain or moderate pain.      albuterol (VENTOLIN HFA) 108 (90 Base) MCG/ACT inhaler Inhale 2 puffs into the lungs every 6 (six) hours as needed for wheezing or shortness of breath. 8 g 1   amiodarone (PACERONE) 200 MG tablet 1 po BID x 14 days, then 1 po daily (Patient taking differently: Take 200 mg by mouth daily.)     apixaban (ELIQUIS) 5 MG TABS tablet Take 1 tablet (5 mg total) by mouth 2 (two) times daily. 60 tablet 0   atorvastatin (LIPITOR) 40 MG tablet Take 1 tablet by mouth at bedtime.     busPIRone (BUSPAR) 7.5 MG tablet Take 7.5 mg by mouth 2 (two) times daily.     calcium carbonate (TUMS - DOSED IN MG ELEMENTAL CALCIUM) 500 MG chewable tablet Chew 1 tablet by mouth 4 (four) times daily as needed for heartburn.     cholecalciferol (VITAMIN D3) 25 MCG (1000 UNIT) tablet Take 2,000 Units by mouth daily.     clonazePAM (KLONOPIN) 0.5 MG tablet Take 1 tablet (0.5 mg total) by mouth 3 (three) times daily as needed for anxiety. 12 tablet 0   DULoxetine (CYMBALTA) 60 MG capsule Take 1 capsule by mouth daily.     insulin aspart (NOVOLOG FLEXPEN) 100 UNIT/ML FlexPen Inject 10-16 Units into the skin 3 (three) times daily with meals. INJECT SUBCUTANEOUSLY AS FOLLOWS WITH MEALS: 90-150=10u: 151-200=11u: 201-250=12u: 251-300=13u: 301-350=14u: 351-400=15u: BS>400=16u & CALL MD.     insulin detemir (LEVEMIR FLEXTOUCH) 100 UNIT/ML FlexPen Inject 18 Units into the skin at bedtime. 15 mL 2   latanoprost (XALATAN) 0.005 % ophthalmic  solution Place 1 drop into both eyes at bedtime.     lubiprostone (AMITIZA) 8 MCG capsule Take 1 capsule by mouth daily.     metoprolol tartrate (LOPRESSOR) 25 MG tablet Take 12.5 mg by mouth 2 (two) times daily.     midodrine (PROAMATINE) 10 MG tablet Take 10 mg by mouth 3 (three) times daily.     mirtazapine (REMERON) 30 MG tablet Take 30 mg by mouth at bedtime.     Multiple Vitamin (MULTIVITAMIN) tablet Take 1 tablet by mouth daily.     naloxegol oxalate (MOVANTIK) 12.5 MG TABS tablet Take 1 tablet (12.5 mg total) by mouth daily. 30 tablet 1   ondansetron (ZOFRAN) 4 MG tablet Take 4 mg by mouth 2 (two) times daily.     oxybutynin (DITROPAN-XL) 5 MG 24 hr tablet Take 5 mg by mouth daily.     pantoprazole (  PROTONIX) 40 MG tablet Take 1 tablet (40 mg total) by mouth 2 (two) times daily before a meal. 180 tablet 1   polyethylene glycol (MIRALAX / GLYCOLAX) 17 g packet Take 17 g by mouth daily. 14 each 0   risperiDONE (RISPERDAL) 1 MG tablet Take 1 mg by mouth 2 (two) times daily. Take one tablet at 900am and 1 tablet at 5pm and '2mg'$  at bedtime     risperiDONE (RISPERDAL) 2 MG tablet Take 2 mg by mouth at bedtime. Take '2mg'$  at bedtime and '1mg'$  at 9am and 5p daily     senna (SENOKOT) 8.6 MG tablet Take 1 tablet (8.6 mg total) by mouth at bedtime as needed for constipation.     Skin Protectants, Misc. (BAZA PROTECT EX) Apply 1 Application topically in the morning and at bedtime.     TRADJENTA 5 MG TABS tablet TAKE (1) TABLET BY MOUTH ONCE DAILY. (Patient taking differently: Take 5 mg by mouth daily.) 30 tablet 3   No current facility-administered medications for this visit.   Allergies:  Sulfa antibiotics   ROS: No syncope.  Physical Exam: VS:  BP 110/70 (BP Location: Left Arm, Patient Position: Sitting, Cuff Size: Normal)   Pulse 85   Ht '5\' 5"'$  (1.651 m)   Wt 128 lb (58.1 kg)   SpO2 97%   BMI 21.30 kg/m , BMI Body mass index is 21.3 kg/m.  Wt Readings from Last 3 Encounters:  12/22/21 128  lb (58.1 kg)  11/16/21 141 lb 1.5 oz (64 kg)  10/10/21 129 lb 13.6 oz (58.9 kg)    General: Patient appears comfortable at rest. HEENT: Conjunctiva and lids normal. Lungs: Clear to auscultation, nonlabored breathing at rest. Cardiac: .  Irregularly irregular with 2/6 systolic murmur, no gallop. Extremities: No pitting edema.  ECG:  An ECG dated 11/13/2021 was personally reviewed today and demonstrated:  Atrial fibrillation with RVR, low voltage in the precordial leads, decreased R wave progression.  Recent Labwork: 09/13/2021: B Natriuretic Peptide 229.0 10/10/2021: TSH 1.685 11/16/2021: ALT 10; AST 14; BUN 14; Creatinine, Ser 0.44; Hemoglobin 8.8; Magnesium 2.0; Platelets 217; Potassium 3.9; Sodium 139     Component Value Date/Time   CHOL 107 12/02/2020 0858   TRIG 82 12/02/2020 0858   HDL 46 12/02/2020 0858   CHOLHDL 2.3 12/02/2020 0858   CHOLHDL 3.8 10/05/2019 0457   VLDL 30 10/05/2019 0457   LDLCALC 45 12/02/2020 0858    Other Studies Reviewed Today:  Echocardiogram 09/14/2021:  1. Severe asymmetric hypertrophy of the basal septum up to 83m. There is  evidence of SAM of the MV leaflets with obstruction up to 37 mmHG  (variation noted due to afib). Findings are consistent with hypertrophic  obstructive cardiomyopathy. Would  consider a cardiac MRI for better characterization. This was not well  characterized on the last echocardiogram. Left ventricular ejection  fraction, by estimation, is 65 to 70%. The left ventricle has normal  function. The left ventricle has no regional  wall motion abnormalities. There is severe asymmetric left ventricular  hypertrophy of the basal-septal segment. Left ventricular diastolic  function could not be evaluated.   2. Right ventricular systolic function is normal. The right ventricular  size is normal.   3. Left atrial size was mildly dilated.   4. Right atrial size was mildly dilated.   5. The mitral valve is degenerative. Trivial  mitral valve regurgitation.  No evidence of mitral stenosis.   6. The aortic valve is tricuspid. There is mild  calcification of the  aortic valve. There is mild thickening of the aortic valve. Aortic valve  regurgitation is not visualized. Aortic valve sclerosis/calcification is  present, without any evidence of  aortic stenosis.   Assessment and Plan:  1.  Paroxysmal to persistent atrial fibrillation with CHA2DS2-VASc score of 6.  Plan to continue amiodarone and Lopressor for heart rate control along with Eliquis for stroke prophylaxis.  I reviewed her recent lab work.  2.  Hypertrophic cardiomyopathy documented by echocardiography, mild resting gradient and with plan for conservative management in light of comorbidities.  I would not pursue a cardiac MRI at this point.  3.  Mixed hyperlipidemia, on Lipitor.  Last LDL 45.  4.  Orthostatic hypotension, currently stable.  She continues on midodrine.  Medication Adjustments/Labs and Tests Ordered: Current medicines are reviewed at length with the patient today.  Concerns regarding medicines are outlined above.   Tests Ordered: No orders of the defined types were placed in this encounter.   Medication Changes: No orders of the defined types were placed in this encounter.   Disposition:  Follow up  6 months.  Signed, Satira Sark, MD, [facility] 12/22/2021 2:24 PM    [place] [facility] [address], [place], [place] [address] Phone: [phone]; Fax: [phone]

## 2021-12-22 NOTE — Patient Instructions (Signed)
Medication Instructions:  Your physician recommends that you continue on your current medications as directed. Please refer to the Current Medication list given to you today.   Labwork: None today  Testing/Procedures: None today  Follow-Up: 6 months  Any Other Special Instructions Will Be Listed Below (If Applicable).  If you need a refill on your cardiac medications before your next appointment, please call your pharmacy.  

## 2021-12-25 ENCOUNTER — Emergency Department (HOSPITAL_COMMUNITY): Payer: Medicare Other

## 2021-12-25 ENCOUNTER — Emergency Department (HOSPITAL_COMMUNITY)
Admission: EM | Admit: 2021-12-25 | Discharge: 2022-01-23 | Disposition: E | Payer: Medicare Other | Attending: Emergency Medicine | Admitting: Emergency Medicine

## 2021-12-25 ENCOUNTER — Other Ambulatory Visit: Payer: Self-pay

## 2021-12-25 ENCOUNTER — Encounter (HOSPITAL_COMMUNITY): Payer: Self-pay | Admitting: Emergency Medicine

## 2021-12-25 DIAGNOSIS — J9601 Acute respiratory failure with hypoxia: Secondary | ICD-10-CM | POA: Insufficient documentation

## 2021-12-25 DIAGNOSIS — I110 Hypertensive heart disease with heart failure: Secondary | ICD-10-CM | POA: Diagnosis not present

## 2021-12-25 DIAGNOSIS — R4182 Altered mental status, unspecified: Secondary | ICD-10-CM | POA: Diagnosis present

## 2021-12-25 DIAGNOSIS — Z87891 Personal history of nicotine dependence: Secondary | ICD-10-CM | POA: Diagnosis not present

## 2021-12-25 DIAGNOSIS — I509 Heart failure, unspecified: Secondary | ICD-10-CM | POA: Insufficient documentation

## 2021-12-25 DIAGNOSIS — Z96642 Presence of left artificial hip joint: Secondary | ICD-10-CM | POA: Insufficient documentation

## 2021-12-25 DIAGNOSIS — E119 Type 2 diabetes mellitus without complications: Secondary | ICD-10-CM | POA: Diagnosis not present

## 2021-12-25 DIAGNOSIS — Z8616 Personal history of COVID-19: Secondary | ICD-10-CM | POA: Diagnosis not present

## 2021-12-25 DIAGNOSIS — I469 Cardiac arrest, cause unspecified: Secondary | ICD-10-CM | POA: Diagnosis not present

## 2021-12-25 DIAGNOSIS — I959 Hypotension, unspecified: Secondary | ICD-10-CM | POA: Insufficient documentation

## 2022-01-05 ENCOUNTER — Ambulatory Visit: Payer: Medicare Other | Admitting: Nurse Practitioner

## 2022-01-23 NOTE — ED Notes (Signed)
CDS called for referral. They report pt is suitable for possible tissue and eye donation and to do eye prep after family visits the pt.   Spoke with Bethena Roys. Referral number: [id number].

## 2022-01-23 NOTE — ED Provider Notes (Signed)
[facility] Emergency Department Provider Note MRN:  [id number]  Arrival date & time: 12-28-21     Chief Complaint   Altered Mental Status   History of Present Illness   [name] is a 77 y.o. year-old female with a history of A-fib, CHF, diabetes presenting to the ED with chief complaint of altered mental status.  Patient coming from care facility with unresponsiveness.  Had been having trouble urinating and trouble with bowel movements and a distended abdomen earlier today.  Patient is DNR/DNI.  Review of Systems  I was unable to obtain a full/accurate HPI, PMH, or ROS due to the patient's unresponsiveness.  Patient's Health History    Past Medical History:  Diagnosis Date   Anxiety    Arthritis    Atrial fibrillation (HCC)    Atrial flutter (HCC)    CHF (congestive heart failure) ([place])    Collagen vascular disease ([place])    COVID-19 virus infection    COVID-19+ approx 01/24/19; asymptomatic course with full recovery   Dependence on wheelchair    pivot/transfers   Depression    History of psychosis and previous suicide attempt   DVT, lower extremity, recurrent ([place])    Long-term Coumadin per Dr. Legrand Rams   Essential hypertension    GERD (gastroesophageal reflux disease)    Hemiplegia ([place]) 2010   Left side   History of stroke    Acute infarct and right cerebral white matter small vessel disease 12/10   Hyperlipidemia    Hypotension, unspecified    IDA (iron deficiency anemia)    Irritable bowel syndrome without diarrhea    Leg DVT (deep venous thromboembolism), acute ([place]) 2006   Obstructive hypertrophic cardiomyopathy (HCC)    Orthostatic hypotension    Schizophrenia ([place])    Stroke ([place])    left sided weakness   Type 2 diabetes mellitus ([place])    Vitamin D deficiency     Past Surgical History:  Procedure Laterality Date   BACK SURGERY     BIOPSY N/A 11/24/2014   Procedure: BIOPSY;  Surgeon: Danie Binder, MD;  Location[facility];   Service: Endoscopy;  Laterality: N/A;   BIOPSY  05/17/2016   Procedure: BIOPSY;  Surgeon: Danie Binder, MD;  [facility];  Service: Endoscopy;;  gastric biopsy   COLONOSCOPY WITH PROPOFOL N/A 11/24/2014   Dr. Rudie Meyer polyps removed/moderate sized internal hemorrhoids, tubular adenomas. Next surveillance in 3 years   ESOPHAGEAL DILATION N/A 07/23/2021   Procedure: ESOPHAGEAL DILATION;  Surgeon: Eloise Harman, DO;  [facility];  Service: Endoscopy;  Laterality: N/A;   ESOPHAGOGASTRODUODENOSCOPY (EGD) WITH PROPOFOL N/A 11/24/2014   Dr. Clayburn Pert HH/patent stricture at the gastroesophageal junction, mild non-erosive gastritis, path negative for H.pylori or celiac sprue   ESOPHAGOGASTRODUODENOSCOPY (EGD) WITH PROPOFOL N/A 05/17/2016   Procedure: ESOPHAGOGASTRODUODENOSCOPY (EGD) WITH PROPOFOL;  Surgeon: Danie Binder, MD;  [facility];  Service: Endoscopy;  Laterality: N/A;  12:45pm   ESOPHAGOGASTRODUODENOSCOPY (EGD) WITH PROPOFOL N/A 07/23/2021   Procedure: ESOPHAGOGASTRODUODENOSCOPY (EGD) WITH PROPOFOL;  Surgeon: Eloise Harman, DO;  [facility];  Service: Endoscopy;  Laterality: N/A;   GIVENS CAPSULE STUDY N/A 12/11/2014   MULTILPLE EROSION IN the stomach WITH ACTIVE OOZING. OCCASIONAL EROSIONS AND RARE ULCER SEEN IN PROXIMAL SMALL BOWEL . No masses or AVMs SEEN. NO OLD BLOOD OR FRESH BLOOD SEEN.    HIP ARTHROPLASTY Left 11/11/2021   Procedure: ARTHROPLASTY BIPOLAR HIP (HEMIARTHROPLASTY);  Surgeon: Arther Abbott  E, MD;  Location: [facility];  Service: Orthopedics;  Laterality: Left;   POLYPECTOMY N/A 11/24/2014   Procedure: POLYPECTOMY;  Surgeon: Danie Binder, MD;  Location: [facility];  Service: Endoscopy;  Laterality: N/A;   SAVORY DILATION N/A 05/17/2016   Procedure: SAVORY DILATION;  Surgeon: Danie Binder, MD;  [facility];  Service: Endoscopy;  Laterality: N/A;    Family History  Problem Relation Age of Onset   Hypertension  Mother    Colon cancer Neg Hx     Social History   Socioeconomic History   Marital status: Widowed    Spouse name: Not on file   Number of children: Not on file   Years of education: Not on file   Highest education level: Not on file  Occupational History   Not on file  Tobacco Use   Smoking status: Former    Packs/day: 0.25    Years: 20.00    Total pack years: 5.00    Types: Cigarettes    Quit date: 01/24/1995    Years since quitting: 26.9   Smokeless tobacco: Never  Vaping Use   Vaping Use: Never used  Substance and Sexual Activity   Alcohol use: No    Alcohol/week: 0.0 standard drinks of alcohol   Drug use: No   Sexual activity: Never    Birth control/protection: Post-menopausal  Other Topics Concern   Not on file  Social History Narrative   Not on file   Social Determinants of Health   Financial Resource Strain: Not on file  Food Insecurity: No Food Insecurity (11/10/2021)   Hunger Vital Sign    Worried About Running Out of Food in the Last Year: Never true    Ran Out of Food in the Last Year: Never true  Transportation Needs: No Transportation Needs (11/10/2021)   PRAPARE - Hydrologist (Medical): No    Lack of Transportation (Non-Medical): No  Physical Activity: Not on file  Stress: Not on file  Social Connections: Not on file  Intimate Partner Violence: Not At Risk (11/10/2021)   Humiliation, Afraid, Rape, and Kick questionnaire    Fear of Current or Ex-Partner: No    Emotionally Abused: No    Physically Abused: No    Sexually Abused: No     Physical Exam   Vitals:   January 17, 2022 0530 Jan 17, 2022 0535  BP:    Pulse:  (!) 37  Resp: (!) 33 (!) 30  Temp:    SpO2:  (!) 88%    CONSTITUTIONAL: Ill-appearing NEURO/PSYCH: Unresponsive EYES: Nonreactive pupils ENT/NECK:  no LAD, no JVD CARDIO: Bradycardic rate, poorly perfused PULM: Agonal breathing GI/GU: Mildly distended abdomen MSK/SPINE:  No gross deformities, no  edema SKIN:  no rash, atraumatic   *Additional and/or pertinent findings included in MDM below  Diagnostic and Interventional Summary    EKG Interpretation  Date/Time:    Ventricular Rate:    PR Interval:    QRS Duration:   QT Interval:    QTC Calculation:   R Axis:     Text Interpretation:         Labs Reviewed  CBG MONITORING, ED    No orders to display    Medications - No data to display   Procedures  /  Critical Care .Critical Care  Performed by: Maudie Flakes, MD Authorized by: Maudie Flakes, MD   Critical care provider statement:    Critical care time (minutes):  45  Critical care was necessary to treat or prevent imminent or life-threatening deterioration of the following conditions:  Respiratory failure (Cardiac arrest, end-of-life care discussions)   Critical care was time spent personally by me on the following activities:  Development of treatment plan with patient or surrogate, discussions with consultants, evaluation of patient's response to treatment, examination of patient, ordering and review of laboratory studies, ordering and review of radiographic studies, ordering and performing treatments and interventions, pulse oximetry, re-evaluation of patient's condition and review of old charts   ED Course and Medical Decision Making  Initial Impression and Ddx Patient is DNR/DNI and her MOST form reads as comfort measures, oxygen as needed for comfort, but otherwise no fluids, no significant interventions.  I spoke with her legal guardian Vito Backers on the phone and confirmed these wishes.  She has a long history of medical problems and poor quality of life.  Overall her presentation at this time is consistent with the dying process.  She is hypotensive, bradycardic, profoundly hypoxic.  She is breathing quite inadequately.  I do not have a palpable pulse, still with a traceable heart rhythm, still with very shallow rapid breathing.  I explained to the legal  guardian and that I suspect she will die within the next minutes to hours, possibly hours to days.  After gaining better understanding of the main goal of comfort rather than extending life, oxygen administration was discontinued as it did not seem to be providing any additional comfort.  We discussed diagnostics and again confirmed that this is not her wish, she is full comfort care.  Past medical/surgical history that increases complexity of ED encounter: Diabetes, CHF  Interpretation of Diagnostics Laboratory and/or imaging options to aid in the diagnosis/care of the patient were considered.  After careful history and physical examination, it was determined that there was no indication for diagnostics at this time.  Patient Reassessment and Ultimate Disposition/Management     Will allow patient time in the emergency department to pass away, if there is extended time we will request hospitalist admission for comfort measures.  5:50 AM update: Called to bedside by nursing, patient has stopped breathing.  She is pulseless, she is showing no signs of life.  She has expired.  Time of death 5:51 AM.  Patient's daughter notified, will come to see the patient.  Patient's legal guardian team also made aware.  Patient management required discussion with the following services or consulting groups:  None  Complexity of Problems Addressed Acute illness or injury that poses threat of life of bodily function  Additional Data Reviewed and Analyzed Further history obtained from: Further history from spouse/family member  Additional Factors Impacting ED Encounter Risk Consideration of hospitalization and Involvement or initiation of DNR  Barth Kirks. [place] Small, [place] [email or web address]  Final Clinical Impressions(s) / ED Diagnoses     ICD-10-CM   1. Hypotension, unspecified hypotension type  I95.9     2. Acute respiratory failure with hypoxia  (HCC)  J96.01     3. Cardiac arrest [facility])  I46.9       ED Discharge Orders     None        Discharge Instructions Discussed with and Provided to Patient:   Discharge Instructions   None      Maudie Flakes, MD 01/07/2022 [phone]

## 2022-01-23 NOTE — ED Notes (Signed)
Pt agonal breathing at this time. BP (23); HR 58; O2 80%

## 2022-01-23 NOTE — ED Notes (Signed)
Pt on 4L [place] upon arrival. Pt's O2 sat in the 50s. Pt placed on 6L [place]; O2 sat's increased to 68% Pt then placed on NRB 15L; O2 sat's increased to 82-88%

## 2022-01-23 NOTE — ED Notes (Signed)
Family member on the way

## 2022-01-23 NOTE — ED Notes (Signed)
EDP confirmed time of death at [phone]

## 2022-01-23 NOTE — ED Triage Notes (Signed)
Pt bib EMS from [facility] after staff said pt had a change in mental status. Staff called stating that pt initially had abdominal distention at the beginning of their shift and that their NP was notified and I&O cath was performed which only resulted in 174m urine. They then asked pt when her last BM was and pt stated in was 4 days ago. They then attempted manual disimpaction but stated stool was too high up. Pt apparently takes pain meds q6hrs at facility. Pt was then given Dulcolax suppository, followed by Biscodyl and Miralax then staff noted that pt was not as alert so they contacted NP for facility again who recommended that pt be sent to ED for evaluation.

## 2022-01-23 DEATH — deceased

## 2022-02-09 ENCOUNTER — Ambulatory Visit: Payer: Medicare Other | Admitting: Orthopedic Surgery
